# Patient Record
Sex: Female | Born: 1955 | Race: White | Hispanic: No | Marital: Single | Smoking: Former smoker
Health system: Southern US, Community
[De-identification: ages and names within clinical notes are randomized; demographics above are authoritative.]

## PROBLEM LIST (undated history)

## (undated) DIAGNOSIS — Z95810 Presence of automatic (implantable) cardiac defibrillator: Secondary | ICD-10-CM

## (undated) DIAGNOSIS — K219 Gastro-esophageal reflux disease without esophagitis: Secondary | ICD-10-CM

## (undated) DIAGNOSIS — K5790 Diverticulosis of intestine, part unspecified, without perforation or abscess without bleeding: Secondary | ICD-10-CM

## (undated) DIAGNOSIS — J449 Chronic obstructive pulmonary disease, unspecified: Secondary | ICD-10-CM

## (undated) DIAGNOSIS — E079 Disorder of thyroid, unspecified: Secondary | ICD-10-CM

## (undated) DIAGNOSIS — F4310 Post-traumatic stress disorder, unspecified: Secondary | ICD-10-CM

## (undated) DIAGNOSIS — M549 Dorsalgia, unspecified: Secondary | ICD-10-CM

## (undated) DIAGNOSIS — I429 Cardiomyopathy, unspecified: Secondary | ICD-10-CM

## (undated) DIAGNOSIS — K5792 Diverticulitis of intestine, part unspecified, without perforation or abscess without bleeding: Secondary | ICD-10-CM

## (undated) DIAGNOSIS — G8929 Other chronic pain: Secondary | ICD-10-CM

## (undated) DIAGNOSIS — I219 Acute myocardial infarction, unspecified: Secondary | ICD-10-CM

## (undated) DIAGNOSIS — I519 Heart disease, unspecified: Secondary | ICD-10-CM

## (undated) DIAGNOSIS — I509 Heart failure, unspecified: Secondary | ICD-10-CM

## (undated) DIAGNOSIS — J45909 Unspecified asthma, uncomplicated: Secondary | ICD-10-CM

## (undated) DIAGNOSIS — R112 Nausea with vomiting, unspecified: Secondary | ICD-10-CM

## (undated) DIAGNOSIS — I10 Essential (primary) hypertension: Secondary | ICD-10-CM

## (undated) DIAGNOSIS — K56609 Unspecified intestinal obstruction, unspecified as to partial versus complete obstruction: Secondary | ICD-10-CM

## (undated) DIAGNOSIS — I428 Other cardiomyopathies: Secondary | ICD-10-CM

## (undated) DIAGNOSIS — M1990 Unspecified osteoarthritis, unspecified site: Secondary | ICD-10-CM

## (undated) DIAGNOSIS — I493 Ventricular premature depolarization: Secondary | ICD-10-CM

## (undated) DIAGNOSIS — E785 Hyperlipidemia, unspecified: Secondary | ICD-10-CM

## (undated) HISTORY — PX: CARDIAC CATHETERIZATION: SHX172

## (undated) HISTORY — DX: Diverticulosis of intestine, part unspecified, without perforation or abscess without bleeding: K57.90

## (undated) HISTORY — DX: Dorsalgia, unspecified: M54.9

## (undated) HISTORY — DX: Disorder of thyroid, unspecified: E07.9

## (undated) HISTORY — DX: Unspecified intestinal obstruction, unspecified as to partial versus complete obstruction: K56.609

## (undated) HISTORY — DX: Diverticulitis of intestine, part unspecified, without perforation or abscess without bleeding: K57.92

## (undated) HISTORY — DX: Heart disease, unspecified: I51.9

## (undated) HISTORY — DX: Cardiomyopathy, unspecified: I42.9

## (undated) HISTORY — PX: NECK SURGERY: SHX720

## (undated) HISTORY — DX: Post-traumatic stress disorder, unspecified: F43.10

## (undated) HISTORY — DX: Chronic obstructive pulmonary disease, unspecified: J44.9

## (undated) HISTORY — PX: TONSILLECTOMY: SUR1361

## (undated) HISTORY — PX: CARPAL TUNNEL RELEASE: SHX101

## (undated) HISTORY — PX: CORONARY ANGIOPLASTY: SHX604

## (undated) HISTORY — DX: Other chronic pain: G89.29

## (undated) HISTORY — DX: Hyperlipidemia, unspecified: E78.5

## (undated) HISTORY — PX: ICD IMPLANT: EP1208

## (undated) HISTORY — PX: HAND SURGERY: SHX662

## (undated) HISTORY — DX: Nausea with vomiting, unspecified: R11.2

## (undated) HISTORY — DX: Ventricular premature depolarization: I49.3

## (undated) HISTORY — DX: Unspecified asthma, uncomplicated: J45.909

## (undated) HISTORY — PX: OTHER SURGICAL HISTORY: SHX169

## (undated) HISTORY — DX: Essential (primary) hypertension: I10

## (undated) HISTORY — PX: CHOLECYSTECTOMY: SHX55

## (undated) HISTORY — PX: APPENDECTOMY: SHX54

## (undated) HISTORY — PX: NASAL SEPTUM SURGERY: SHX37

## (undated) HISTORY — DX: Other cardiomyopathies: I42.8

---

## 1998-04-12 ENCOUNTER — Ambulatory Visit (HOSPITAL_COMMUNITY): Admission: RE | Admit: 1998-04-12 | Discharge: 1998-04-12 | Payer: Self-pay | Admitting: Family Medicine

## 1999-01-03 ENCOUNTER — Ambulatory Visit (HOSPITAL_COMMUNITY): Admission: RE | Admit: 1999-01-03 | Discharge: 1999-01-03 | Payer: Self-pay | Admitting: Neurosurgery

## 1999-01-03 ENCOUNTER — Encounter: Payer: Self-pay | Admitting: Neurosurgery

## 2000-04-02 ENCOUNTER — Encounter: Payer: Self-pay | Admitting: Neurosurgery

## 2000-04-02 ENCOUNTER — Ambulatory Visit (HOSPITAL_COMMUNITY): Admission: RE | Admit: 2000-04-02 | Discharge: 2000-04-02 | Payer: Self-pay | Admitting: Neurosurgery

## 2000-04-09 ENCOUNTER — Encounter: Payer: Self-pay | Admitting: Cardiovascular Disease

## 2001-09-25 ENCOUNTER — Ambulatory Visit (HOSPITAL_COMMUNITY): Admission: RE | Admit: 2001-09-25 | Discharge: 2001-09-25 | Payer: Self-pay

## 2007-10-05 ENCOUNTER — Ambulatory Visit (HOSPITAL_BASED_OUTPATIENT_CLINIC_OR_DEPARTMENT_OTHER): Admission: RE | Admit: 2007-10-05 | Discharge: 2007-10-05 | Payer: Self-pay | Admitting: Urology

## 2007-10-05 ENCOUNTER — Encounter (INDEPENDENT_AMBULATORY_CARE_PROVIDER_SITE_OTHER): Payer: Self-pay | Admitting: Urology

## 2008-05-20 ENCOUNTER — Ambulatory Visit (HOSPITAL_COMMUNITY): Admission: RE | Admit: 2008-05-20 | Discharge: 2008-05-20 | Payer: Self-pay | Admitting: Neurosurgery

## 2008-07-06 ENCOUNTER — Inpatient Hospital Stay (HOSPITAL_COMMUNITY): Admission: RE | Admit: 2008-07-06 | Discharge: 2008-07-07 | Payer: Self-pay | Admitting: Neurosurgery

## 2008-09-20 ENCOUNTER — Ambulatory Visit (HOSPITAL_BASED_OUTPATIENT_CLINIC_OR_DEPARTMENT_OTHER): Admission: RE | Admit: 2008-09-20 | Discharge: 2008-09-20 | Payer: Self-pay | Admitting: Orthopedic Surgery

## 2008-12-12 ENCOUNTER — Encounter: Admission: RE | Admit: 2008-12-12 | Discharge: 2008-12-12 | Payer: Self-pay | Admitting: Neurosurgery

## 2009-03-27 ENCOUNTER — Encounter (INDEPENDENT_AMBULATORY_CARE_PROVIDER_SITE_OTHER): Payer: Self-pay | Admitting: *Deleted

## 2009-03-27 ENCOUNTER — Ambulatory Visit: Payer: Self-pay | Admitting: Cardiovascular Disease

## 2009-03-29 ENCOUNTER — Encounter: Payer: Self-pay | Admitting: Cardiovascular Disease

## 2009-04-04 ENCOUNTER — Ambulatory Visit: Payer: Self-pay | Admitting: Cardiovascular Disease

## 2009-04-04 ENCOUNTER — Inpatient Hospital Stay (HOSPITAL_BASED_OUTPATIENT_CLINIC_OR_DEPARTMENT_OTHER): Admission: RE | Admit: 2009-04-04 | Discharge: 2009-04-04 | Payer: Self-pay | Admitting: Cardiovascular Disease

## 2009-04-07 ENCOUNTER — Ambulatory Visit: Payer: Self-pay | Admitting: Cardiovascular Disease

## 2009-04-28 ENCOUNTER — Ambulatory Visit: Payer: Self-pay | Admitting: Cardiovascular Disease

## 2009-05-11 ENCOUNTER — Ambulatory Visit: Payer: Self-pay | Admitting: Cardiovascular Disease

## 2009-05-25 ENCOUNTER — Ambulatory Visit: Payer: Self-pay | Admitting: Cardiovascular Disease

## 2009-05-31 ENCOUNTER — Ambulatory Visit: Payer: Self-pay

## 2009-06-02 ENCOUNTER — Encounter: Payer: Self-pay | Admitting: Internal Medicine

## 2009-06-05 ENCOUNTER — Ambulatory Visit: Payer: Self-pay | Admitting: Cardiovascular Disease

## 2009-06-27 ENCOUNTER — Ambulatory Visit: Payer: Self-pay | Admitting: Cardiovascular Disease

## 2009-06-28 ENCOUNTER — Telehealth: Payer: Self-pay | Admitting: Cardiovascular Disease

## 2009-07-27 ENCOUNTER — Ambulatory Visit: Payer: Self-pay | Admitting: Cardiovascular Disease

## 2009-09-06 ENCOUNTER — Ambulatory Visit (HOSPITAL_COMMUNITY)
Admission: RE | Admit: 2009-09-06 | Discharge: 2009-09-06 | Payer: Self-pay | Source: Home / Self Care | Admitting: Cardiovascular Disease

## 2009-09-06 ENCOUNTER — Encounter: Payer: Self-pay | Admitting: Cardiovascular Disease

## 2009-09-06 ENCOUNTER — Ambulatory Visit: Payer: Self-pay

## 2009-09-06 ENCOUNTER — Ambulatory Visit: Payer: Self-pay | Admitting: Cardiovascular Disease

## 2009-09-12 ENCOUNTER — Ambulatory Visit: Payer: Self-pay | Admitting: Cardiovascular Disease

## 2010-01-09 ENCOUNTER — Ambulatory Visit: Payer: Self-pay | Admitting: Cardiovascular Disease

## 2010-02-20 ENCOUNTER — Telehealth: Payer: Self-pay | Admitting: Cardiovascular Disease

## 2010-02-28 ENCOUNTER — Ambulatory Visit: Payer: Self-pay | Admitting: Cardiovascular Disease

## 2010-03-09 ENCOUNTER — Ambulatory Visit: Payer: Self-pay | Admitting: Cardiovascular Disease

## 2010-03-21 ENCOUNTER — Telehealth: Payer: Self-pay | Admitting: Cardiovascular Disease

## 2010-04-10 ENCOUNTER — Encounter: Admission: RE | Admit: 2010-04-10 | Discharge: 2010-04-10 | Payer: Self-pay | Admitting: Neurosurgery

## 2010-06-04 ENCOUNTER — Ambulatory Visit: Payer: Self-pay | Admitting: Cardiovascular Disease

## 2010-06-19 ENCOUNTER — Telehealth: Payer: Self-pay | Admitting: Cardiovascular Disease

## 2010-08-26 DIAGNOSIS — Z95810 Presence of automatic (implantable) cardiac defibrillator: Secondary | ICD-10-CM

## 2010-08-26 DIAGNOSIS — I219 Acute myocardial infarction, unspecified: Secondary | ICD-10-CM | POA: Insufficient documentation

## 2010-08-26 HISTORY — DX: Presence of automatic (implantable) cardiac defibrillator: Z95.810

## 2010-09-11 ENCOUNTER — Ambulatory Visit: Admit: 2010-09-11 | Payer: Self-pay | Admitting: Cardiovascular Disease

## 2010-09-25 NOTE — Miscellaneous (Signed)
  Clinical Lists Changes  Medications: Changed medication from CARVEDILOL 3.125 MG TABS (CARVEDILOL) Take one tablet by mouth twice a day to CARVEDILOL 6.25 MG TABS (CARVEDILOL) Take one tablet by mouth twice a day - Signed Added new medication of SIMVASTATIN 20 MG TABS (SIMVASTATIN) Take one tablet by mouth daily at bedtime - Signed Rx of CARVEDILOL 6.25 MG TABS (CARVEDILOL) Take one tablet by mouth twice a day;  #60 x 12;  Signed;  Entered by: Fredia Beets, RN;  Authorized by: Vivi Martens MD;  Method used: Electronically to [facility]. [phone]*, [address], [place], [place], [place]  [address], Ph: [phone] or [phone], Fax: [phone] Rx of SIMVASTATIN 20 MG TABS (SIMVASTATIN) Take one tablet by mouth daily at bedtime;  #30 x 12;  Signed;  Entered by: Fredia Beets, RN;  Authorized by: Vivi Martens MD;  Method used: Electronically to [facility]. [phone]*, [address] [place], [place], [place]  [address], Ph: [phone] or [phone], Fax: [phone]    Prescriptions: SIMVASTATIN 20 MG TABS (SIMVASTATIN) Take one tablet by mouth daily at bedtime  #30 x 12   Entered by:   Fredia Beets, RN   Authorized by:   Vivi Martens MD   Signed by:   Fredia Beets, RN on 04/07/2009   Method used:   Electronically to        [facility]. [phone]* (retail)       [address] [place], [place]  [address]       Ph: [phone] or [phone]       Fax: [phone]   RxID:   [id number] CARVEDILOL 6.25 MG TABS (CARVEDILOL) Take one tablet by mouth twice a day  #60 x 12   Entered by:   Fredia Beets, RN   Authorized by:   Vivi Martens MD   Signed by:   Fredia Beets, RN on 04/07/2009   Method used:   Electronically to        [facility]. [phone]* (retail)       [address]       [place],   [address]       Ph: [phone] or [phone]  Fax: [phone]   RxID:   [id number]

## 2010-09-25 NOTE — Progress Notes (Signed)
  Phone Note Outgoing Call   Call placed by: Rhett Bannister LPN Call placed to: Patient Summary of Call: T.C. to patient notifying her per Dr. Zenia Resides that labs were normal.

## 2010-09-25 NOTE — Letter (Signed)
Summary: [facility] Cardiology Records  [facility] Cardiology Records   Imported By: Sallee Provencal 04/14/2009 12:31:54  _____________________________________________________________________  External Attachment:    Type:   Image     Comment:   External Document

## 2010-09-25 NOTE — Progress Notes (Signed)
  Phone Note Call from Patient   Caller: Patient Summary of Call: T.C. from patient-c/o cold symptoms for about 1 week now.  Congested in nose and hard to breathe.  Wants to know what she can take with her other medications.  Phone # [phone] Initial call taken by: Rhett Bannister  LPN,  October 25, 624THL 11:54 AM  Follow-up for Phone Call        LMVM-avoid decongestants.  Can use antihistamine like Benadryl and cough syrup like Robitussin. Follow-up by: Rhett Bannister  LPN,  October 25, 624THL 1:23 PM

## 2010-09-25 NOTE — Progress Notes (Signed)
Summary: medication   Phone Note Call from Patient   Summary of Call: Pt states since d/c Spirolactone at her last visit she is extremely fatigued, edema in feet and BP is slightly higher per PCP.  She states some days she just doesn't feel like getting up.  Please call her with suggestions. Initial call taken by: Luana Shu,  February 20, 2010 11:17 AM  Follow-up for Phone Call        Phone Call Completed:  Patient put on Lasix 20mg  once daily per Dr. Zenia Resides.  Rx called in.    Follow-up by: Rhett Bannister,  February 20, 2010 3:15 PM

## 2010-09-25 NOTE — Procedures (Signed)
Summary: summary report  summary report   Imported By: Parks Neptune 06/06/2009 14:59:43  _____________________________________________________________________  External Attachment:    Type:   Image     Comment:   External Document

## 2010-09-25 NOTE — Miscellaneous (Signed)
Summary: new meds  Clinical Lists Changes  Medications: Added new medication of CARVEDILOL 3.125 MG TABS (CARVEDILOL) Take one tablet by mouth twice a day - Signed Added new medication of LISINOPRIL 5 MG TABS (LISINOPRIL) Take one tablet by mouth daily - Signed Rx of CARVEDILOL 3.125 MG TABS (CARVEDILOL) Take one tablet by mouth twice a day;  #60 x 12;  Signed;  Entered by: Fredia Beets, RN;  Authorized by: Vivi Martens MD;  Method used: Electronically to [facility]. [phone]*, [address] [place], [place], [place]  [address], Ph: [phone] or [phone], Fax: [phone] Rx of LISINOPRIL 5 MG TABS (LISINOPRIL) Take one tablet by mouth daily;  #30 x 12;  Signed;  Entered by: Fredia Beets, RN;  Authorized by: Vivi Martens MD;  Method used: Electronically to [facility]. [phone]*, [address], [place], [place], [place]  [address], Ph: [phone] or [phone], Fax: [phone]    Prescriptions: LISINOPRIL 5 MG TABS (LISINOPRIL) Take one tablet by mouth daily  #30 x 12   Entered by:   Fredia Beets, RN   Authorized by:   Vivi Martens MD   Signed by:   Fredia Beets, RN on 03/27/2009   Method used:   Electronically to        [facility]. [phone]* (retail)       [address] [place], [place]  [address]       Ph: [phone] or [phone]       Fax: [phone]   RxID:   [id number] CARVEDILOL 3.125 MG TABS (CARVEDILOL) Take one tablet by mouth twice a day  #60 x 12   Entered by:   Fredia Beets, RN   Authorized by:   Vivi Martens MD   Signed by:   Fredia Beets, RN on 03/27/2009   Method used:   Electronically to        [facility]. [phone]* (retail)       [address]       [place], [place]  [address]       Ph: [phone] or [phone]       Fax: [phone]   RxID:   [id number]

## 2010-09-25 NOTE — Cardiovascular Report (Signed)
Summary: Pre Cath Orders  Pre Cath Orders   Imported By: Sallee Provencal 04/14/2009 12:28:46  _____________________________________________________________________  External Attachment:    Type:   Image     Comment:   External Document

## 2010-09-25 NOTE — Progress Notes (Signed)
  Phone Note Outgoing Call   Call placed by: Rhett Bannister  LPN,  July 27, 624THL 624THL PM Call placed to: Patient Summary of Call: Patient notified per Dr. Fletcher Anon, Holter monitor showed lots of skipping beats(PVCs).  Advised to increase Carvedilol to 12.5mg  in AM and 6.25mg  in pm.

## 2010-10-12 ENCOUNTER — Ambulatory Visit: Payer: Medicare Other | Admitting: Cardiovascular Disease

## 2010-12-01 LAB — POCT I-STAT 3, VENOUS BLOOD GAS (G3P V)
Acid-base deficit: 1 mmol/L (ref 0.0–2.0)
Bicarbonate: 24.6 mEq/L — ABNORMAL HIGH (ref 20.0–24.0)
O2 Saturation: 72 %
TCO2: 26 mmol/L (ref 0–100)
pCO2, Ven: 44.3 mmHg — ABNORMAL LOW (ref 45.0–50.0)
pH, Ven: 7.353 — ABNORMAL HIGH (ref 7.250–7.300)
pO2, Ven: 40 mmHg (ref 30.0–45.0)

## 2010-12-01 LAB — POCT I-STAT 3, ART BLOOD GAS (G3+)
Bicarbonate: 24.4 mEq/L — ABNORMAL HIGH (ref 20.0–24.0)
O2 Saturation: 93 %
TCO2: 26 mmol/L (ref 0–100)
pCO2 arterial: 39.9 mmHg (ref 35.0–45.0)
pH, Arterial: 7.396 (ref 7.350–7.400)
pO2, Arterial: 69 mmHg — ABNORMAL LOW (ref 80.0–100.0)

## 2010-12-10 LAB — POCT HEMOGLOBIN-HEMACUE: Hemoglobin: 12.7 g/dL (ref 12.0–15.0)

## 2010-12-25 HISTORY — PX: OTHER SURGICAL HISTORY: SHX169

## 2011-01-08 NOTE — Assessment & Plan Note (Signed)
[place] OFFICE NOTE   NAME:[name], [name]                       MRN:          [id number]  DATE:06/04/2010                            DOB:          [birth year]    [name] is a 55 year old female who is here today for a followup  visit.  She has the following problem list:  1. History of nonischemic cardiomyopathy.  Ejection fraction was 25-      30% in July 2010.  Cardiac catheterization in August 2010 showed      nonobstructive coronary artery disease.  Right heart      catheterization showed normal right-sided pressures.  Most recent      ejection fraction was 50%.  2. Hyperlipidemia.  3. Chronic back pain.  4. Tobacco use.  5. History of frequent PVCs.   INTERIM HISTORY:  During her last visit, the patient complained of  generalized fatigue and dizziness.  I decreased her dose of Lasix to  once a day.  I also asked her to decrease lisinopril to 5 mg once daily.  However, she continued taking it twice daily.  I requested Holter  monitor, which showed still frequent PVCs.  Thus, the dose of carvedilol  was increased to 12.5 mg in the morning and 6.25 mg in the afternoon.  Overall, she feels slightly better than before.  She has chronic  exertional dyspnea.  Her dizziness has improved.  She has less  palpitations than before.   CURRENT MEDICATIONS:  1. Simvastatin 20 mg at bedtime.  2. Nexium 40 mg daily.  3. Duragesic patch 75 mcg every 3 days.  4. Aspirin 81 mg once daily.  5. Lasix 20 mg once daily.  6. Lisinopril 5 mg twice daily, this will be changed to once daily.  7. Carvedilol 12.5 mg in the morning and 6.25 mg in the afternoon.   PHYSICAL EXAMINATION:  VITAL SIGNS:  Weight is 127.8 pounds, blood  pressure is 103/66, pulse is 64, oxygen saturation is 97% on room air.  NECK:  No JVD or carotid bruits.  LUNGS:  Clear to auscultation.  HEART:  Regular rate and rhythm with no gallops or  murmurs.  ABDOMEN:  Benign, nontender, nondistended.  EXTREMITIES:  With no clubbing, cyanosis, or edema.   IMPRESSION:  1. 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  Most recent ejection      fraction improved to 50%.  She continues to feel dizzy and      lightheaded.  Thus, I will decrease lisinopril to 5 mg once daily.      We will continue with carvedilol and once daily furosemide.  She      did not tolerate spironolactone in the past due to hyperkalemia.  2. Frequent premature ventricular contractions with palpitations.      These improved after increasing the dose of carvedilol to 12.5 mg      in the morning.  We will consider obtaining a Holter monitor in the      future.  If there are recurrent symptoms, then I will consider      switching carvedilol to metoprolol.  3. Tobacco use.  The patient is trying to quit smoking and currently      is using a nicotine patch.  She will follow up in 4 months from now      or earlier if needed.     Kathlyn Sacramento, MD  Electronically Signed    MA/MedQ  DD: 06/04/2010  DT: 06/05/2010  Job #: [id number]

## 2011-01-08 NOTE — Assessment & Plan Note (Signed)
[facility] OFFICE NOTE   NAME:[name], Crystal [name]                       MRN:          [id number]  DATE:01/09/2010                            DOB:          1956-06-07    PROBLEM LIST:  1. History of nonischemic cardiomyopathy.  Her ejection fraction was      25-30% in July 2010.  In August 2010, she had nonobstructive      coronary artery disease and left heart catheterization and normal      pressure is on the right heart catheterization.  Repeat echo in      January 2011 showed a normalized ejection fraction of 50-55%.  2. Hyperlipidemia.  3. Chronic back pain.  4. Tobacco use.   INTERVAL HISTORY:  The patient states she has been doing about the same  since her last visit.  She is currently in the middle of the move and is  under much stress because of it.  She has been compliant with her  medications.  She continues to endorse fatigue and dyspnea with moderate-  to-more exertion.  She has chest tightness infrequently.  She has  decreased her tobacco use to approximately 4 cigarettes a day, but is  requesting medical intervention to assist with complete cessation.  She  has had no significant change in lower extremity edema.   PHYSICAL EXAMINATION:  VITAL SIGNS:  Blood pressure is 97/64, pulse 73,  satting 96% on room air, and she weighs 138 pounds.  GENERAL:  No acute distress.  HEENT:  Normocephalic, atraumatic.  NECK:  Supple.  HEART:  Regular rate and rhythm without murmur, rub or gallop.  LUNGS:  Clear bilaterally.  ABDOMEN:  Soft, nontender, nondistended.  EXTREMITIES:  Without edema.  SKIN:  Warm and dry.   Review of the patient's labs drawn on Jan 05, 2010; sodium 141,  potassium 5.2, chloride 102, CO2 32, BUN 14, creatinine 0.6, glucose 81,  total cholesterol 178, HDL 56, triglycerides 100, LDL 102.   ASSESSMENT AND PLAN:  The patient is doing reasonably well from a  cardiovascular  standpoint.  She is mildly hyperkalemic on her lab work.  Today, we will stop Aldactone 12.5 mg.  She should continue on her other  medications.  Ejection fraction per the official report was normal,  however per my independent read it was mildly reduced.  She should  continue on beta-  blocker and ACE inhibitor.  We will write her a prescription for Chantix  today which she will take  for a period of 12 weeks.  Her cholesterol is adequate and she should  continue on simvastatin.  We will see her back in 3 months' time.     Arlee Muslim, MD  Electronically Signed    SGA/MedQ  DD: 01/09/2010  DT: 01/09/2010  Job #: [id number]

## 2011-01-08 NOTE — Assessment & Plan Note (Signed)
[facility] OFFICE NOTE   NAME:[name], [name]                       MRN:          [id number]  DATE:05/11/2009                            DOB:          25-Jan-1956    PROBLEM LIST:  1. Nonischemic cardiomyopathy.  Echo dated on February 28, 2009, showed an      ejection fraction of 25-30%.  A left heart catheterization on      August 2010 shows minimal nonobstructive coronary artery disease.      LV gram showed an ejection fraction of 25-30%.  Right heart      catheterization at the same time showed a normal pulmonary artery      pressure and mean capillary wedge pressure of 8.  The patient has      no history of alcohol abuse.  She had a negative iron profile,      negative ANA, negative HIV test and has a questionable family      history of cardiac issues.  2. Hyperlipidemia.  3. Chronic back pain causing disability.   INTERVAL HISTORY:  Since her last visit, the patient states she has had  slightly increased levels of fatigue.  Her dyspnea on exertion has been  stable and that she gets moderate dyspnea after walking for 2-3 minutes.  She has not had any increase in lower extremity edema.  She continues to  have three-pillow orthopnea.  She denies any paroxysmal nocturnal  dyspnea.  She also denies any syncopal events.  She does endorse  occasional palpitations that are not associated with dizziness.  She  states that she has been compliant with all of her medications.   PHYSICAL EXAMINATION:  VITAL SIGNS:  Her blood pressure initially was  101/65, however rechecked manually was 122/64.  She weighs 138 pounds,  she has been stable over the past month.  Her pulse is 72, sating 96% on  room air.  GENERAL:  She is in no acute distress.  HEENT:  Nonfocal.  NECK:  There is no JVD.  HEART:  Regular rate and rhythm without murmur, rub or gallop.  LUNGS:  Clear bilaterally.  ABDOMEN:  Soft, nontender and  nondistended.  EXTREMITIES:  Without edema.  SKIN:  Warm and dry.   LABORATORY FINDINGS:  Review of the patient's most recent labs show a  sodium of 137, potassium of 4.6, chloride 101, CO2 30, BUN 18,  creatinine 0.66 and glucose is 88.   MEDICATION LIST:  1. Aspirin 81 mg daily.  2. Coreg 6.25 mg b.i.d.  3. Lisinopril 5 mg b.i.d.  4. Aldactone 12.5 mg daily.  5. Duragesic patch 75 every 2 days.  6. Simvastatin 20 mg p.o. nightly.  7. Nexium 40 mg daily.   ASSESSMENT:  A 55 year old white female with nonischemic cardiomyopathy  (NYHA 2-3) that is likely either familiar or idiopathic in origin.  She  continues to appear euvolemic or perhaps slightly positive fluid status.  We will continue the above medications, except we will increase her  Aldactone to 25 mg daily.  The patient does state she has been  having  increased fatigue, so we will hold off on increasing the beta-blocker at  this time.  However, her blood pressure is stable.  At the next office  visit, we will plan on increasing her beta-blocker dose.  At the  beginning of October, we will likely recheck a transthoracic  echocardiogram to evaluate for any improvement in left ventricular  systolic function.  We will also have her see Dr. Caryl Comes on October 11 to  be evaluated for ICD implantation for primary prevention of sudden  cardiac death.      Arlee Muslim, MD  Electronically Signed    SGA/MedQ  DD: 05/11/2009  DT: 05/12/2009  Job #: [id number]

## 2011-01-08 NOTE — Op Note (Signed)
NAME:  [name], [name]                ACCOUNT NO.:  [id number]   MEDICAL RECORD NO.:  [id number]          PATIENT TYPE:  AMB   LOCATION:  [facility]                         FACILITY:  [facility]   PHYSICIAN:  Lucina Mellow. Terance Hart, M.D.DATE OF BIRTH:  [birth year]   DATE OF PROCEDURE:  10/05/2007  DATE OF DISCHARGE:                               OPERATIVE REPORT   PREOPERATIVE DIAGNOSIS:  Bladder ulcer that is inflammatory or possibly  malignant in origin.   POSTOPERATIVE DIAGNOSIS:  Bladder ulcer that is inflammatory or possibly  malignant in origin.   PROCEDURE:  1. Cystoscopy.  2. Bilateral retrograde pyelograms with interpretation.  3. Cold cup bladder biopsy.   SURGEON:  Hessie Diener, MD   ANESTHESIA:  General.   INDICATIONS:  This lady has had a long history of recurrent UTIs and has  bilateral stones on a CT scan and she was cystoscoped and found to have  denuded mucosa with yellow-gray base and some small calcifications that  were persistent on followup therapy for infection.  She is brought to  the OR today for further evaluation including biopsy to determine the  etiology of these bladder lesions.   DESCRIPTION OF PROCEDURE:  The patient was brought to the operating room  and placed in lithotomy position.  External genitalia were prepped and  draped in the usual fashion.  She was cystoscoped and there were no  papillary tumors, but there was an area of a yellow fibrinous, denuded  mucosa on the left bladder floor and wall, partially including the  mucosa over the intramural ureter.   Bilateral retrograde pyelograms were obtained by utilizing a cone-tip  catheter inserted cystoscopically.  Both ureters were delicate and  nonobstructed.  The pyelocaliceal systems were also delicate and  nonobstructed and with no filling defects.   Numerous cold cup bladder biopsies were taken from peripheral and  central areas of this abnormal bladder mucosa on the left floor and wall  and  specimens were sent to Pathology.  At the conclusion of the  procedure, I reinjected contrast in the left ureter and saw no evidence  of extravasation or obstruction or perforation.   At this point, the bladder was emptied, cystoscope removed and B&O  suppository inserted.  She was taken to the recovery room in good  condition.      Lucina Mellow. Terance Hart, M.D.  Electronically Signed     LJP/MEDQ  D:  10/05/2007  T:  10/06/2007  Job:  [id number]

## 2011-01-08 NOTE — Op Note (Signed)
NAME:  [name], ROIGER NO.:  [id number]   MEDICAL RECORD NO.:  [id number]          PATIENT TYPE:  INP   LOCATION:  [id number]                         FACILITY:  [facility]   PHYSICIAN:  Ophelia Charter, M.D.DATE OF BIRTH:  [birth year]   DATE OF PROCEDURE:  07/06/2008  DATE OF DISCHARGE:                               OPERATIVE REPORT   BRIEF HISTORY:  The patient is a 55 year old white female who has  suffered from neck and arm pain.  She failed medical management and  worked up with a cervical MRI and myelo-CT, which demonstrated the  patient had a spondylolisthesis at C4-5 as well as degenerative disk  disease and spondylosis at C5-6 and diffuse degenerative changes.  I  discussed the various treatment options with the patient including  surgery.  She has weighed the risks, benefits, and alternatives of  surgery and decided to proceed with a C4-5 and 5-6 anterior cervical  diskectomy, fusion and plating.   PREOPERATIVE DIAGNOSES:  C4-5 spondylolisthesis, C5-6 degenerative disk  disease, spondylosis, stenosis, cervical myelopathy, transverse  radiculopathy, cervicalgia.   POSTOPERATIVE DIAGNOSES:  C4-5 spondylolisthesis ,C5-6 degenerative disk  disease, spondylosis, stenosis, cervical myelopathy, transverse  radiculopathy, cervicalgia.   PROCEDURES:  C4-5 and C5-6 extensive anterior cervical  diskectomy/decompression; C4-5 and C5-6 anterior interbody arthrodesis  with local morselized autograft bone and Actifuse bone graft extender;  insertion of C4-5 and C5-6 interbody prosthesis (Alphatec PEEK interbody  prosthesis); C5 through C7 anterior cervical instrumentation with Codman  Slim-Loc titanium plate and screws.   SURGEON:  Ophelia Charter, MD   ASSISTANT:  Leeroy Cha, MD   ANESTHESIA:  General endotracheal.   ESTIMATED BLOOD LOSS:  100 mL.   SPECIMENS:  None.   DRAINS:  None.   COMPLICATIONS:  None.   DESCRIPTION OF PROCEDURE:  The patient was  brought to the operating room  by the anesthesia team.  General endotracheal anesthesia was induced.  The patient remained in supine position and a roll was placed under her  shoulders to place her neck in slight extension.  Her anterior cervical  region was then prepared with Betadine scrub and Betadine solution and  sterile drapes were applied.  I then used a scalpel to make a transverse  incision in the patient's left anterior neck.  I used the Metzenbaum  scissors to divide the platysma muscle and then to dissect medial to  sternocleidomastoid muscle, jugular vein, and carotid artery.  I  carefully dissected down towards the anterior cervical spine identifying  the esophagus and retracted medially.  I used skin swabs to clear soft  tissue from the anterior cervical spine and inserted a bent spinal  needles in the vertebral space.  We obtained intraoperative radiograph  to confirm our location.   We then used electrocautery to detach the medial border of muscle  bilaterally from the C4-5 and C5-6 intervertebral disk space.  We  inserted self-retaining retractor underneath the longus colli  bilaterally to provide exposure.  We began the decompression C4-5.  We  incised the C4-5 intervertebral disk with a #15 blade scalpel and we  performed a partial intervertebral diskectomy with a pituitary forceps  and a Karlin curettes.  We then inserted distraction screws at C4 and C5  and then distracted interspace.  We then used a high-speed drill to  decorticate the vertebral endplates at D34-534, drill away the remainder of  the C4-5 intervertebral disk.  We drill away some posterior spondylosis.  I then thinned out the posterior longitudinal ligaments.  We then  incised the ligament with arachnoid knife.  I then removed with Kerrison  punch undercutting the vertebral endplates at D34-534 decompressing the  thecal sac.  We then performed foraminotomies about the bilateral C5  nerve root completing  the decompression at this level.   We then repeated this procedure in an analogous fashion at C5-C6  decompressing the C5-C6 thecal sac and the bilateral C6 nerve roots.  This completed the decompression.   We now turned attention to arthrodesis.  We used trial spacers and  determined to use a 5-mm Alphatec medium PEEK interbody prosthesis at  both levels.  We prefilled this prosthesis with a combination of local  autograft bone.  We obtained decompression as well as Actifuse bone  graft extender.  We inserted the prosthesis into the distracted  interspace at C4-5, C5-6, and then removed distraction screw.  There was  a good snug fit of prosthesis at both levels.   We now turned our attention to anterior spinal instrumentation.  We used  a high-speed drill to remove some ventral spondylosis from the vertebral  endplates at D34-534 and 075-GRM, so the plate would lay down flat.  We  selected the appropriate length Codman Slim-Loc anterior cervical plate.  We laid along the anterior aspect of the vertebral bodies of C4 through  C6.  We then drilled two 12 mm holes at C4, 5, and 6 and secured the  plate to the vertebral bodies by placing two 12-mm self-tapping screws  at C4, 5, and 6.  We obtained intraoperative radiograph, which  demonstrated good position of plate, screws, and interbody prosthesis.  We, therefore, secured the screws to the plate by locking each cam.  This completed the instrumentation.   We then obtained hemostasis using bipolar electrocautery.  We irrigated  the wound out with bacitracin solution.  We removed the retractor.  We  then inspected the esophagus for any damage and there was none apparent.  We then reapproximated the patient's platysma muscle with interrupted 3-  0 Vicryl suture, subcutaneous tissue with interrupted 3-0 Vicryl suture,  and the skin with Steri-Strips and Benzoin.  The wound was then coated  with bacitracin ointment and a sterile dressing was  applied.  Drapes  were removed.  The patient was subsequently extubated by the anesthesia  team and transported to the postanesthesia care unit in stable  condition.  All sponge, instrument, and needle counts were correct at  the end of the case.      Ophelia Charter, M.D.  Electronically Signed     JDJ/MEDQ  D:  07/06/2008  T:  07/07/2008  Job:  [id number]

## 2011-01-08 NOTE — Assessment & Plan Note (Signed)
[facility] OFFICE NOTE   NAME:[name], [name]                       MRN:          [id number]  DATE:09/12/2009                            DOB:          Aug 19, 1956    PROBLEM LIST:  1. History of nonischemic cardiomyopathy echo July 6 showed an EF 25-      30%, left heart cath in August 10 showed minimal nonobstructive CAD      with an EF 25-30%.  RHC at that time showed normal PA pressures and      a wedge of 8.  2. Hyperlipidemia.  3. Chronic back pain.  4. Tobacco use.   INTERVAL HISTORY:  The patient states that since her last visit, she  thinks that she has had some improvement in her dyspnea on exertion.  She has been attempting to walk more and is able to walk through [facility] for about 20 minutes without having to stop and rest.  She  continues to get palpitations but no associated dizziness or syncope.  She is compliant with all her medications.  She is attempting to stop  smoking in that she is on the Nicorette patch and is currently down to  smoking one to two cigarettes a day.  She is in her second week of  titration of the patch.   PHYSICAL EXAMINATION:  VITAL SIGNS:  Her blood pressure is 112/63, her  pulse is 74, satting 97% on room air.  She weighs 136 pounds, which is 3  pounds less than she weighed at the beginning of December.  GENERAL:  She is in no acute distress.  HEENT:  Normocephalic, atraumatic.  NECK:  Supple.  There is no JVD.  HEART:  Regular rate and rhythm without murmur, rub, or gallop.  LUNGS:  Clear bilaterally.  ABDOMEN:  Soft, nontender, nondistended.  EXTREMITIES:  Without edema.  SKIN:  Warm and dry.   Review of the echo report dated January 12, left ventricular systolic  function was thought to be 50%-55%; however, it also stated that there  was a diffuse hypokinesis.  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  There is  mild mitral regurgitation.   ASSESSMENT/PLAN:   The patient's left ventricular systolic function  appears to have improved as has her symptomatology.  She will continue  on the current medical regimen as listed in the chart.  She may stop  taking the digoxin.  If she notices that she feels worse off the  digoxin, she should restart it.  At the next office visit, we will check  a BMP, BNP, and fasting lipids.  She  states she is also willing to try Chantix if the Nicorette patches does  not work regarding her  tobacco cessation.  We will see the patient back in the 4 months' time  or if the problem were to arise in the interim.     Arlee Muslim, MD  Electronically Signed    SGA/MedQ  DD: 09/12/2009  DT: 09/13/2009  Job #: [id number]

## 2011-01-08 NOTE — Assessment & Plan Note (Signed)
[facility] OFFICE NOTE   NAME:[name], [name]                       MRN:          [id number]  DATE:07/27/2009                            DOB:          22-Oct-1955    PROBLEM LIST:  1. Nonischemic cardiomyopathy, echo dated February 28, 2009, EF 25-30%,      left heart cath in August 2010 showed minimal nonobstructive CAD      with an EF 25-30%, right heart cath at that time showed normal PA      pressures and a wedge of 8.  Echocardiogram dated October 2010      showed an EF of 40-45%.  The official read and my personal review      demonstrate an ejection fraction closer to 35%.  2. Hyperlipidemia.  3. Chronic back pain.  4. Tobacco use.   INTERVAL HISTORY:  The patient states since her last visit, she has been  getting along about the same.  She still has fatigue and weakness with  mild to moderate exertion.  She continues to have frequent palpitations  lasting several minutes.  These were present when she wore her Holter  monitor which indicated frequent PVCs.  The patient denies any lower  extremity edema.  She has been compliant with her medications.  At last  office visit, we had instructed her to hold the digoxin to see if her  intermittent visual abnormalities improved and they had not, so she  restarted that medication.  She continues to have infrequent but  intermittent blurriness/waviness of her vision.   PHYSICAL EXAMINATION:  VITAL SIGNS:  Today, blood pressure is 103/63,  pulse is 71, satting 96% on room air.  She weighs 139 pounds, which is 2  pounds less than she weighed at the beginning of November 2010.  GENERAL:  No acute distress.  HEENT:  Nonfocal.  NECK:  Supple.  There is no JVD.  HEART:  Regular rate and rhythm without any murmur, rub, or gallop.  LUNGS:  Clear bilaterally.  ABDOMEN:  Soft, nontender, nondistended.  EXTREMITIES:  Without edema.  SKIN:  Cool and dry.   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  She is on a good medical regimen which include  Coreg 12.5 mg b.i.d., lisinopril 5 mg b.i.d. Aldactone 12.5 mg daily,  and digoxin 0.125 mg daily.  She also takes simvastatin 20 mg p.o. daily  at bedtime and aspirin 81 mg daily.  The patient is having persistent  weakness and fatigue and her blood pressures has been on a low side.  Today, we will try decreasing her Coreg 6.25 mg b.i.d. to see if this  improves the weakness and fatigue she is experiencing.  We will check an  echo at the beginning of January to more firmly establish an accurate  ejection  fraction.  At that time, if her ejection fraction remains below 35%, we  will arrange for defibrillator  placement.  The patient is instructed that if her palpitations persist  longer than usual that she should report  to the emergency room or call  911 if necessary.     Arlee Muslim, MD  Electronically Signed    SGA/MedQ  DD: 07/27/2009  DT: 07/28/2009  Job #: [id number]

## 2011-01-08 NOTE — Letter (Signed)
March 29, 2009    [facility]  [place], [place]   RE:  ISLAROSE, LOCKHEART  MRN:  [id number]  /  DOB:  06/19/1956   Dear Ms. Carlson,   I have received the records of the previous cardiac workup that you  received two years ago and would like to discuss these results with you.  We have, unfortunately, been unable to reach you by telephone.  Please  contact my office at [phone] at your earliest convenience so that  we can discuss the next steps that must be taken.   I look forward to hearing from you.    Sincerely,      Arlee Muslim, MD    SGA/MedQ  DD: 03/29/2009  DT: 03/29/2009  Job #: [id number]

## 2011-01-08 NOTE — Assessment & Plan Note (Signed)
[facility] OFFICE NOTE   NAME:[name] [name] HODGMAN                       MRN:          [id number]  DATE:06/27/2009                            DOB:          Nov 09, 1955    PROBLEM LIST:  1. Nonischemic cardiomyopathy.  Echo dated February 28, 2009 showed an EF      of 25-30%.  Left heart cath in August 2010 showed minimal      nonobstructive CAD with an EF of 25-30%, right heart cath at that      time showed normal PA pressures and a mean wedge pressure of 8.      Echocardiogram from October 2010 showed an ejection fraction of 40-      45% per the official read, however, no comparison to the old study      was made.  My personal review of the echo demonstrated an ejection      fraction of approximately 35-40%.  2. Hyperlipidemia.  3. Chronic back pain causing disability.   INTERVAL HISTORY:  The patient states since her last visit with Dr.  Caryl Comes on June 05, 2009, she has been getting along fairly well.  She  does endorse some new episodes of wavy lines in her visual field that  are sometimes associated with some dizziness.  These have started since  her last office visit.  Of note, the patient was started on digoxin  during her last office visit.  She continues to sleep on several  pillows.  She denies any syncopal episodes, although she did fall  yesterday after she had one of the episodes of visual disturbances.  She  is compliant with her medications, but has not taken them yet this  morning.   PHYSICAL EXAMINATION:  VITAL SIGNS:  Today, her blood pressure is  114/63, pulse is 64, and weight is 141 pounds.  She is sating 97% on  room air.  GENERAL:  No acute distress.  HEENT:  Nonfocal.  NECK:  There is no JVD.  HEART:  Regular rate and rhythm with a 1/6 systolic murmur.  LUNGS:  Clear bilaterally.  ABDOMEN:  Soft, nontender, and nondistended.  EXTREMITIES:  Without edema.  SKIN:  Cool and dry.   Review  of the patient's Holter monitor demonstrates multiple PVCs, they  comprise less than 10% of her total heartbeats.  There are 2  morphologies to the PVCs.  Review of her echocardiogram as above in HPI.   ASSESSMENT AND PLAN:  The patient has had no changes in her symptoms  over the past month since digoxin was started.  The visual disturbances  may be secondary to this medication, therefore, we will ask her to hold  the medication for 1 week's time.  If she experiences any dizziness or  the visual disturbances continue off the medication, she should contact  our office and we may consider decreasing her beta-blocker dose.  She  should continue on her other medications which include lisinopril 5 mg  b.i.d., Aldactone 12.5 mg daily, carvedilol 12.5 mg b.i.d., and  simvastatin 20 mg at bedtime.  She is also on Nexium, Duragesic patch,  Advair p.r.n., and Combivent inhaler p.r.n.  We will get the images of  the first echocardiogram that the patient had and compare it to the most  recent.  It appears that her ejection fraction is at least  moderately and possibly still severely depressed.  Because the last  echocardiogram showed a borderline ejection fraction, we may follow the  patient for now and repeat an echocardiogram again in the 3 months'  time.  We will see the patient back in 1 month's time.     Arlee Muslim, MD  Electronically Signed    SGA/MedQ  DD: 06/27/2009  DT: 06/28/2009  Job #: [id number]

## 2011-01-08 NOTE — Op Note (Signed)
[name], [name] NO.:  [id number]   MEDICAL RECORD NO.:  [id number]          PATIENT TYPE:  AMB   LOCATION:  [place]                          FACILITY:  [facility]   PHYSICIAN:  Daryll Brod, M.D.       DATE OF BIRTH:  [birth year]   DATE OF PROCEDURE:  09/20/2008  DATE OF DISCHARGE:                               OPERATIVE REPORT   PREOPERATIVE DIAGNOSIS:  Cubital tunnel syndrome, left elbow.   POSTOPERATIVE DIAGNOSIS:  Cubital tunnel syndrome, left elbow.   OPERATION:  Decompression of left ulnar nerve.   SURGEON:  Daryll Brod, MD   ASSISTANT:  Odessa Fleming, RN   ANESTHESIA:  Supraclavicular block.   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  This has not responded to conservative  treatment.  She has elected to undergo surgical decompression.  Pre,  peri, and postoperative course is discussed along with the risks and  complications.  She is aware that there is no guarantee with the  surgery; possibility of infection; recurrence of injury to arteries,  nerves, and tendons; incomplete relief of symptoms; dystrophy; and  possibility of numbness and tingling at posterior aspect of her elbow  secondary to scarring, injury to the posterior branch and the medial  antebrachial cutaneous nerve of the forearm.  In the preoperative area,  the patient is seen.  The extremity marked by both the patient and  surgeon.  Antibiotic given.   PROCEDURE:  The patient was brought to the operating room where a  supraclavicular block was carried out under the direction of Dr.  Ola Spurr.  She was prepped using DuraPrep, supine position, left arm  free.  A time-out was taken.  The limb was exsanguinated with an Esmarch  bandage, tourniquet was placed in the upper arm, was inflated to 250  mmHg.  A longitudinal incision was made over the medial epicondyle and  carried down through  the subcutaneous tissue.  Care was taken to protect  the branches to the nerve.  Bleeders were electrocauterized with  bipolar.  Osborne fascia was opened.  The ulnar nerve was identified.  Blunt and sharp dissection allowed dissection proximally to the arcade  of Struthers.  The [place] retractors were placed allowing visualization  of the fascia overlying the ulnar nerve.  This was released proximally  for approximately 6 cm.  The subcutaneous tissue was then dissected off  from the forearm fascia of the flexor carpi ulnaris, two heads.  A  release to the fascia was then performed.  The muscle was then split.  The investing fascia over the nerve was then also split taking care to  protect branches to the flexor carpi ulnaris.  This was done with [facility]  retractors and placement for approximately 6 cm distally.  The elbow was  placed through full flexion.  No subluxation to the nerve was apparent.  The wound was copiously irrigated with saline.  Skin was then closed  with interrupted 5-0 Vicryl Rapide sutures  after local infiltration with  0.25% Marcaine without epinephrine.  A sterile compressive dressing,  long-arm splint with the  elbow in approximately 25 degrees of flexion was placed.  The patient  tolerated the procedure well and was taken to the recovery room for  observation in satisfactory condition.  She will be discharged home to  return to the [facility] in 1 week, on Vicodin.           ______________________________  Daryll Brod, M.D.     GK/MEDQ  D:  09/20/2008  T:  09/20/2008  Job:  [id number]

## 2011-01-08 NOTE — Assessment & Plan Note (Signed)
[facility] OFFICE NOTE   NAME:[name], [name]                       MRN:          [id number]  DATE:04/28/2009                            DOB:          11/16/1955    PROBLEM LIST:  1. Nonischemic cardiomyopathy.  Left heart catheterization in August      2010, shows minimal nonobstructive coronary artery disease.  LV      gram showed an ejection fraction of 25-30%.  Right heart      catheterization at the same time showed a normal pulmonary artery      pressure  and pulmonary capillary wedge pressure of 8.  The patient      has no history of alcohol abuse.  She has had a negative iron      profile, negative ANA, negative HIV test, and has a questionable      family history of cardiac issues.  2. Hyperlipidemia.  3. Chronic back pain causing disability.  She is currently on      Duragesic patch.   INTERVAL HISTORY:  Since the last visit the patient states she has been  getting along about the same.  She endorses some mild shortness of  breath walking to her mail box, however, most of her shortness of breath  comes from activity she performs that are beyond her normal daily  activities such as playing with children.  She has infrequent chest  tightness.  She states that over the past couple of days, she had some  very mild bilateral lower extremity edema; however, it resolved on its  own.  She continues to sleep on 3 pillows and has done so for the past  year and states that when she lies down flat, she is not specifically  short of breath, but just not comfortable.  She denies any paroxysmal  nocturnal dyspnea.  She occasionally feels palpitations, but denies any  dizziness or syncopal episodes.  She states that she has been complaint  with her medications.   PHYSICAL EXAMINATION:  VITAL SIGNS:  Blood pressure is 112/71, pulse is  71, and she weighs 142 pounds, which is 4 pounds more than she weighed  in  August of this year.  She is sating 98% on room air.  GENERAL:  She is in no acute distress.  HEENT:  Nonfocal.  NECK:  Supple.  There is some mild JVD at approximately 20 degrees.  HEART:  Regular rate and rhythm without murmur or gallop.  LUNGS:  Clear to auscultation bilaterally.  ABDOMEN:  Soft, nontender, and nondistended.  EXTREMITIES:  Without edema.  SKIN:  Warm and dry.   MEDICATION LIST:  1. Aspirin 325 daily.  2. Coreg 6.25 b.i.d.  3. Lisinopril 5 mg daily.  4. Zocor 20 mg every evening.  5. Duragesic patch 75 mg every 2 days.   ASSESSMENT:  She is a 55 year old white female with nonischemic  cardiomyopathy (NYHA II) that is either familial or idiopathic in  origin.  She appears only very mildly volume overloaded on exam today  and her right heart catheterization last month  indicated that her wedge  pressure was normal.  She has however gained afew pounds that may be  secondary to fluid retention.  Today in Clinic, we will increase  lisinopril 5 mg daily to 5 mg b.i.d.  We will also place the patient on  Aldactone at a dose of 12.5 mg daily with the hope to titrate that up to  25 mg at her next office visit, if her potassium level were not to  increase to an unacceptable level.  Time was spent, discussing the  implantation of an AICD and the patient is receptive to this.  At her  next office visit, we will check a BNP, likely increase her Aldactone to  25 mg daily.  We will also make attempts further titrate up her beta-  blocker.  Should her edema reoccur or show any progressive signs of  volume overload, she would require Lasix therapy.  Within the next few  weeks, we will recheck an echocardiogram to reevaluate her left  ventricular systolic function.  If it were to remain severely depressed,  we will have EP see the patient for ICD implantation.     Arlee Muslim, MD  Electronically Signed    SGA/MedQ  DD: 04/28/2009  DT: 04/28/2009  Job #: [id number]

## 2011-01-08 NOTE — Assessment & Plan Note (Signed)
[facility] OFFICE NOTE   NAME:[name], [name]                       MRN:          [id number]  DATE:03/27/2009                            DOB:          1955/11/30    CHIEF COMPLAINT:  Cardiomyopathy.   HISTORY OF PRESENT ILLNESS:  [name] is a 55 year old white female,  past medical history significant for chronic neck pain, recent diagnosis  of asthma and hyperlipidemia who is presenting with an ejection fraction  of 25% - 30% and moderate mitral regurgitation on an echocardiogram  dated February 28, 2009.  [name] states that she had been seen by Dr.  Geraldo Pitter several years ago, at which time she had a left heart  catheterization, stress test and echocardiogram.  [name] is unsure as  to the reason why these were ordered, she is also unsure as to the  results of these tests.  She does state that she did not receive any  invasive intervention as a result of the left heart catheterization.  She states that over the past two years she has had some chest  discomfort only with extreme exertion and with anxiety.  The chest  discomfort does radiate to her left neck and is associated with some  nausea.  She states that the discomfort has not changed in frequency or  intensity over the past two years.  The [name] also states that over  the past year she has had increased dyspnea on exertion that she notices  only when she tries to walk up several flights of stairs.  She is able  to walk around [employer] without any significant difficulty.  She denies  any lower extremity edema.  She does state that she gets short of breath  when she lays down flat and continually sleeps on three pillows and has  done so for the past year or two.  She denies any paroxysmal nocturnal  dyspnea.  Most recently her primary care physician, Dr. Henrene Pastor, ordered a  chest x-ray which he thought to be abnormal.  This led to him ordering  an  echocardiogram which demonstrated an ejection fraction of 25% - [id number]  and end diastolic diameter of 7.2 cm and moderate mitral regurgitation.  This was not compared to any previous study.   PAST MEDICAL HISTORY:  1. [name] has had a cervical spine fusion in November of 2009.  2. She has had a carpal tunnel release done in the past.  3. She has had her tonsils, appendix and gallbladder removed.  4. Two C-sections in the past.  5. She is disabled from her back pain.   SOCIAL HISTORY:  The [name] continues to smoke approximately one pack a  day of cigarettes and has done so since she was a teenager.  She does  not drink any significant amounts of alcohol.  She is disabled.   FAMILY HISTORY:  Positive for premature coronary disease in that she had  a father who had a heart attack in his late 79s and a brother who  developed heart failure in his 42s.   ALLERGIES:  NO  KNOWN DRUG ALLERGIES.   MEDICATIONS:  1. Prempro 2.5 mg daily.  2. Nexium 40 mg daily.  3. Morphine 60 mg q.12 hours.  4. Advair p.r.n. and Combivent p.r.n. as she was recently diagnosed      with asthma.   REVIEW OF SYSTEMS:  As in HPI, all other systems were reviewed and are  negative.   PHYSICAL EXAMINATION:  [name] has a blood pressure of 129/82, her pulse  is 82, she is saturating 96% on room air and she weighs 140 pounds.  GENERALLY:  She is in no acute distress.  HEENT:  Extraocular movements are intact.  NECK:  Supple, there is no lymphadenopathy, there are no carotid bruits,  very mild JVD at approximately 20 degrees incline.  HEART:  Regular rate and rhythm with an occasional ectopic beat, there  is no murmur, rub or gallop.  Her heart sounds are relatively distant.  LUNGS:  Clear to auscultation bilaterally.  ABDOMEN:  Soft, nontender, nondistended.  EXTREMITIES:  Without edema.  SKIN:  Warm and dry.  NEURO:  Nonfocal.  PSYCHIATRIC:  [name] is appropriate with normal levels of insight,  although she  is mildly tearful and mildly anxious during certain parts  of the medical interview.   EKG independently interviewed by myself demonstrates sinus rhythm, left  ventricular hypertrophy,  leftward axis and incomplete right bundle  branch block and nonspecific T-wave abnormalities.   ASSESSMENT AND PLAN:  This is a 55 year old female with no known past  coronary history presenting with a cardiomyopathy of unknown etiology  and duration.  [name] also has associated moderate mitral  regurgitation.  We will first obtain the records from Dr. Geraldo Pitter from  two years ago when it appears a complete cardiac workup, including an  echocardiogram and left heart catheterization were performed.  Based on  the results of those exams we will tailor our approach.  The [name]  will likely need a repeat left heart catheterization, however, we will  defer these decisions until the [name]'s medical records can be  obtained.  We will contact the [name] once these medical records are  obtained and a decision in evaluation and management have been made.  For now we will start the [name] on aspirin 325 mg daily, Coreg 3.125  mg b.i.d. and lisinopril 5 mg daily as she does have significantly  reduced left ventricular systolic function.     Arlee Muslim, MD  Electronically Signed    SGA/MedQ  DD: 03/27/2009  DT: 03/27/2009  Job #: [id number]

## 2011-01-08 NOTE — Assessment & Plan Note (Signed)
[facility] OFFICE NOTE   NAME:[name], [name]                       MRN:          [id number]  DATE:05/25/2009                            DOB:          28-Dec-1955    PROBLEM LIST:  1. Nonischemic cardiomyopathy echo dated February 28, 2009 showed an      ejection fraction of 25-30%, heart catheterization in August 2010      showed minimal nonobstructive coronary disease an ejection fraction      of 25-30%.  Right heart catheterization at that time showed normal      pulmonary artery pressure and a mean capillary wedge pressure of 8.      The patient denies any history of alcohol abuse.  She has had a      negative iron profile, negative ANA, negative human      immunodeficiency virus test, and as a questionable family history      of cardiac issues.  2. Hyperlipidemia.  3. She has a chronic back pain causing disability.   INTERVAL HISTORY:  The patient states since her last visit, she has been  doing about the same.  She does endorse an occasional sensation in her  chest, they could last an hour or more at a time.  It is associated with  some increased weakness.  She denies any palpitations or syncope.  She  states compliance with her medications.  She denies any PND, but she  continues to sleep on 3 pillows.   PHYSICAL EXAMINATION:  VITAL SIGNS:  Blood pressure in her right arm is  94/70, left arm 96/70, heart rate is 74.  She weighs 138.6 pounds which  is approximately what she weighed at her last office visit.  GENERAL:  No acute distress.  HEENT:  Nonfocal.  NECK:  Supple.  There is no JVD.  HEART:  Regular rate and rhythm without murmur, rub, or gallop.  LUNGS:  Clear bilaterally.  ABDOMEN:  Soft, nontender, nondistended.  EXTREMITIES:  Without edema.  SKIN:  Warm and dry.   LABORATORY FINDINGS:  BMP showed a sodium of 134, potassium 5.5,  chloride 96, CO2 32, BUN 12, creatinine 0.66, glucose 90.   ASSESSMENT AND PLAN:  A 55 year old white female with nonischemic  cardiomyopathy (NYHA II - III) that is likely either familial or  radiographic in origin.  The patient appears euvolemic on exam today.  She is complaining of sensations in her chest that she is not able to  describe very well.  For this reason, we will place her on a 48-hour  Holter monitor in order to assess her for any arrhythmia specifically  nonsustained V-tach.  We will increase her Coreg to 12.5 mg b.i.d.  Because her blood pressures are borderline, she is instructed to contact  our office if she feels any increased weakness or dizziness after this  increasing dose of medication.  The patient is also hyperkalemic at 5.5.  For this reason, we will decrease her Aldactone to 12.5 mg daily as her  potassium was within normal limits on this lower dose.  We  have ordered  an  echo for next week which will reevaluate her left ventricular ejection  fraction 3 months after her initially discovered low EF.  She will see  Dr. Caryl Comes on October 11 for evaluation of ICD placement for primary  prevention of SCD.     Arlee Muslim, MD  Electronically Signed    SGA/MedQ  DD: 05/25/2009  DT: 05/26/2009  Job #: [id number]

## 2011-01-08 NOTE — Letter (Signed)
June 05, 2009    [name], [place], [place] [address]   RE:  Crystal, [name]  MRN:  [id number]  /  DOB:  [birth year]   Dear Nicole Kindred:   It was a pleasure seeing [name] today at your request for  consideration of ICD implantation.   As you know, she is a 55 year old woman with a history of nonischemic  cardiomyopathy that dates back about 5 years.  She has had significant  amounts of ventricular ectopy with this over the years.  When she came  to see you in July, she was noted have severe depression of LV systolic  function in the 20% range.  You have been aggressively titrating her  medications and this has been associated with a gradual improvement in  her exercise performance.  She still has moderate fatigue.  She does not  have nocturnal dyspnea, orthopnea, or peripheral edema.  She does sleep  on a number of pills, but she says this is because of her back and her  neck issues.   She also has complaints of chest pain.  This is described as midsternal  without radiation; it is unrelated to exertion, often last an hour, and  in the wake of somebody having tried her on Nexium, it had been much  improved.   The patient denies syncope.  She does have palpitations.  These she  describes as abrupt in onset and offset frequently accompanying the  chest discomfort.  She was aware of them while she had a Holter monitor  on (please see below).   PAST MEDICAL HISTORY:  In addition to the above, it is notable for  1. Chronic back pain with prior neck fusion surgery.  2. GE reflux disease.  3. Fatigue.  4. Asthma.   PAST SURGICAL HISTORY:  Notable for neck fusions, appendectomy, C-  sections, cholecystectomy, carpal tunnel, and a release of the arm,  unfortunately I did not clarify this while she was here.   SOCIAL HISTORY:  She is living with her boyfriend at this time.  She has  3 children.  She continues to smoke.  She denies significant  alcohol.  She is disabled because of her back.   FAMILY HISTORY:  Notable for premature coronary disease.   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   MEDICATIONS:  1. Nexium.  2. Carvedilol 12.5 b.i.d.  3. Lisinopril 5 b.i.d.  4. Duragesic patch 75 q. 2 d.  5. Simvastatin.   ALLERGIES:  She has no known drug allergies.   PHYSICAL EXAMINATION:  GENERAL:  She is a middle-aged Caucasian female  appearing her stated age of 26.  VITAL SIGNS:  Her blood pressure today was 100/65 with a pulse of 63.  Her weight was 139 which is relatively stable.  HEENT:  Demonstrate no icterus or xanthoma.  Her neck veins were flat.  The carotids are brisk and full bilaterally without bruits.  BACK:  Without kyphosis, scoliosis.  LUNGS:  Clear.  HEART:  Sounds were regular with an S4.  A 2/6 murmur was heard at the  apex.  ABDOMEN:  Soft with active bowel sounds without midline pulsation or  hepatomegaly.  Femoral pulses were 2+.  Distal pulses were intact.  There is no clubbing,  cyanosis, or edema.  NEUROLOGIC:  Grossly normal.  SKIN:  Warm and dry.   Electrocardiogram dated March 27, 2009 demonstrated sinus rhythm at 76  with intervals of 0.17/0.10/0.39.  There is ST-segment depression mildly  in the inferolateral leads and T-wave flattening.  This was consistent  with left ventricular hypertrophy and mild repolarization abnormalities.  On a couple of electrogram, she had a prominent R prime in lead V1 and  V2.  T-waves were upright.   Review of her Holter monitor dated today demonstrated frequent  ventricular ectopy with two predominant morphologies comprising about  10% of all of her beats.  She had minimum heart rate of 44, a peak heart  rate of 104.  There were certainly runs of AIVR, couplets, bigeminy with   variable morphologies.  The distribution of her PVCs was unfortunately  not well quantitated, but appears to be uninterpretable.   A preliminary report of an echo done today read by Dr. Bettina Gavia  demonstrated an ejection fraction of 40-45% with lateral diffuse  hypokinesia.   IMPRESSION:  1. Nonischemic cardiomyopathy with recent ejection fraction of 40-45%      pending your review.  2. Frequent ventricular ectopy potentially contributing to nonischemic      cardiomyopathy.  3. Chest pain, probably gastrointestinal in origin.  4. Daytime somnolence and easy fatigability, question sleep apnea.   DISCUSSION:  [name] has nonischemic cardiomyopathy with 10% PVCs.  She is certainly much better since you have been aggressive with medical  therapy and ejection fraction seems to have gone into a relatively low  risk group of 40-45%.  In the event that hold up, ICD implantation would  certainly not be indicated.   She has no symptoms related to her ventricular ectopy.  This, however,  begs the question as to whether the ventricular ectopy is contributing  to her cardiomyopathy.  At a burden of about 10%, the answer is  possibly.  The studies have that a burden of 5%-30% PVCs can be  associated with a cardiomyopathy in that with their ablation, there  could be improvement; the likelihood of improvement certainly is higher  in the group in whom more ventricular ectopy is noted.  I wonder whether  MRI scan helps to identify the likelihood of recovery here.  However,  she has multiple morphologies of her ventricular ectopy and I think  continuing medical therapy would be the most appropriate course.  Potential consideration could be given to antiarrhythmic drug therapy  for suppression of ventricular ectopy once her medications has been  maximized to see if there is further augmentation and recovery of left  ventricular systolic function.   Thanks very much for the consultation.     Sincerely,      Deboraha Sprang, MD, [facility]  Electronically Signed   SCK/MedQ  DD: 06/05/2009  DT: 06/06/2009  Job #: [id number]

## 2011-01-08 NOTE — Assessment & Plan Note (Signed)
[facility] OFFICE NOTE   NAME:[name], [name]                       MRN:          [id number]  DATE:02/28/2010                            DOB:          [birth year]    PROBLEM LIST:  1. History of nonischemic cardiomyopathy.  Her ejection fraction was      25-30% in July 2010.  Cardiac catheterization in August 2010 showed      nonobstructive coronary artery disease, normal right-sided      pressures.  Most recent ejection fraction was 50%.  2. Hyperlipidemia.  3. Chronic back pain.  4. Tobacco use.  5. History of frequent PVCs.   INTERVAL HISTORY:  [name] is here for a followup visit.  She was  seen by Dr. Zenia Resides recently.  She was noted to be slightly hyperkalemic  and thus spironolactone was stopped.  She complained of increased lower  extremity edema and was started on furosemide 20 mg once daily.  She  actually has been taking furosemide twice daily since then.  Her edema  has improved.  However, she continues to complain of certain days when  she is extremely fatigued with dizziness and inability to perform her  activities of daily living.  Her dyspnea is stable and has not worsened.  She has stable 3-pillow orthopnea.  She denies any PND.  Her lower  extremity edema has resolved since she has been on furosemide.  She  actually feels slightly better today but as I mentioned usually she gets  episodes of generalized weakness and fatigue on certain days.   CURRENT MEDICATIONS:  1. Lisinopril 5 mg twice daily.  This will be decreased to once daily      today.  2. Simvastatin 20 mg at bedtime.  3. Nexium 40 mg daily.  4. Duragesic patch 75 every 3 days.  5. Aspirin 81 mg daily.  6. Carvedilol 6.25 mg twice daily.  7. Furosemide 20 mg twice daily.  This will be decreased to once      daily.   PHYSICAL EXAMINATION:  GENERAL:  Overall, she appears to be in no acute  distress.  VITAL SIGNS:  Weight  of 132.2 pounds, blood pressure 105/65, pulse is  67, oxygen saturation is 98% on room air.  NECK:  No JVD or carotid bruits.  LUNGS:  Clear to auscultation.  HEART:  Regular rate and rhythm with frequent premature beats.  There  are no gallops or murmurs.  ABDOMEN:  Benign, nontender, nondistended.  EXTREMITIES:  No clubbing, cyanosis, or edema.   IMPRESSION:  Congestive heart failure, chronic with history of reduced  LV systolic function.  Most recent ejection fraction was 50%.  She  continues to be in [place] class II or III on certain  days.  Her Aldactone was stopped recently due to hyperkalemia.  Most of  her symptoms at the present time seems to be generalized fatigue,  weakness, and dizziness especially upon standing up.  I suspect maybe  her blood pressure is running low on certain days.  Given that her  ejection fraction  has improved significantly, I do not think we need to  keep her blood pressure that low.  I will go ahead and change lisinopril  to 5 mg once daily.  I also asked her to cut down on furosemide to once  daily.  The other concerning thing is her continued symptoms of  dizziness and palpitations.  She does have history of frequent PVCs.  I  will go ahead and order 48-hour Holter monitor.  I recommended to the  patient to start an exercise program.  She will follow up in 3 months or  earlier as needed.     Kathlyn Sacramento, MD  Electronically Signed    MA/MedQ  DD: 02/28/2010  DT: 03/01/2010  Job #: [id number]

## 2011-01-08 NOTE — Cardiovascular Report (Signed)
NAME:  [name], [name] NO.:  [id number]   MEDICAL RECORD NO.:  [id number]           PATIENT TYPE:   LOCATION:                                 FACILITY:   PHYSICIAN:  Juanda Bond. Burt Knack, MD  DATE OF BIRTH:  November 27, 1955   DATE OF PROCEDURE:  DATE OF DISCHARGE:                            CARDIAC CATHETERIZATION   PROCEDURE:  1. Right and left heart catheterization.  2. Selective coronary angiography.  3. Left ventricular angiography.   INDICATIONS:  [name] is a 55 year old woman who has developed  reduced LV function.  She underwent an echocardiogram after presenting  with shortness of breath and was found to have an LVEF in the range of  25-30% with mitral regurgitation present.  She presented for evaluation  and was referred for right and left heart catheterization to evaluate  for obstructive CAD as well as hemodynamics.   Risks and indications of procedure were reviewed with the patient.  Informed consent was obtained.  The right groin was prepped, draped, and  anesthetized with 1% lidocaine.  Using modified Seldinger technique, a 4-  French sheath was placed in the right femoral artery and a 6-French  sheath was placed in the right femoral vein.  Standard Judkins catheters  were used for coronary angiography.  An end-hole multipurpose catheter  was used for the right heart catheterization.  Oxygen saturations were  drawn from the pulmonary artery and central aorta.  Fick cardiac output  was calculated.  The patient tolerated the procedure well.  There were  no immediate complications.  All catheter exchanges were performed over  a guidewire.   FINDINGS:  Hemodynamics:  Right atrial pressure A-wave 6, V-wave 5, mean  of 4.  RV pressure is 21/6.  Pulmonary artery pressure is 20/6 with a  mean of 13.  Pulmonary capillary wedge pressure A-wave 8, V-wave 8, mean  of 7.  Left ventricular pressure 113/16.  Aortic pressure is 109/62 with  a mean of 80.  Cardiac  output is 5.1 liters per minute.  Cardiac index  is 3.2 liters per minute per meter squared.  Aortic oxygen saturation  93%.  Pulmonary artery oxygen saturation 72%.   Left ventriculography:  There is severe global LV dysfunction.  The LVEF  is estimated at 20-25%.  There appears to be 2+ mitral regurgitation.   CORONARY ANATOMY:  Right coronary artery:  The RCA is widely patent  throughout its course.  It is a dominant vessel and supplies a PDA and a  single small posterolateral branch.  The mid right coronary artery has a  smooth 20% stenosis just after the first RV marginal branch.   Left mainstem:  The left main coronary artery is angiographically  normal.  There is no significant stenosis present.  The left main  bifurcates into the LAD and left circumflex.   LAD:  The LAD is a large-caliber vessel that courses down and reaches  the LV apex.  There are 2 diagonal branches arising from the LAD.  Both  diagonals are angiographically normal.  There is a shelf-like, mild  plaque in the proximal LAD.  There is an associated 20% stenosis.  The  remaining portions of the proximal mid and distal LAD have no  significant stenosis.   Left circumflex:  The left circumflex is a large-caliber vessel  throughout the AV groove.  There are 2 small OM branches followed by a  large third OM and a large left posterolateral branch.  There is no  significant stenosis throughout the left circumflex coronary artery.   FINAL ASSESSMENT:  1. Minimal nonobstructive coronary artery disease as outlined above.  2. Normal hemodynamics.  3. Severe left ventricular systolic dysfunction.  4. Moderate mitral regurgitation.   RECOMMENDATIONS:  Recommend medical therapy for nonischemic  cardiomyopathy.  The patient will follow up with Dr. Zenia Resides for further  care.      Juanda Bond. Burt Knack, MD  Electronically Signed     MDC/MEDQ  D:  04/04/2009  T:  04/04/2009  Job:  [id number]   cc:   Arlee Muslim, MD   Dr. Henrene Pastor

## 2011-01-08 NOTE — Assessment & Plan Note (Signed)
[name] OFFICE NOTE   NAME:[name], Crystal STONESIFER                       MRN:          [id number]  DATE:04/07/2009                            DOB:          May 07, 1956    SUBJECTIVE:  The patient states that since her last visit she has felt  about the same.  She denies any chest discomfort but does have dyspnea  on exertion.  She states that she gets shortness of breath walking to  her mailbox.  She complains of three pillow orthopnea but denies any  significant edema or PND.  Since her last visit she had a left and right  heart catheterization, the results of which are below.  The patient  denies any palpitations or syncopal episodes.  She states she has been  compliant with the medications that we had prescribed at the last visit  and tolerating them well other then some mild GI upset with the Coreg.   EXAMINATION:  Blood pressure is 107/72, pulse is 86, she weighs 138  pounds, saturating 98% on room air.  GENERAL:  No acute distress.  HEENT:  Nonfocal.  NECK:  Supple.  HEART:  Regular rate and rhythm with a 1/6 systolic murmur heard at the  left sternal border.  LUNGS:  Clear bilaterally.  ABDOMEN:  Soft, nontender.  EXTREMITIES:  Without edema.  SKIN:  Warm and dry.  Her right groin at the site of the cath shows no  hematoma or erythema.  There is mild tenderness over the cath area.  PULSES:  She has 2+ bilateral femoral pulses.   I reviewed the patient's left heart catheterization, shows minimal  nonobstructive coronary disease and an ejection fraction of 20% - 25%  with 2+ MR.  Her right heart catheterization showed a wedge pressure of  8 and normal pulmonary artery pressures.  The cholesterol profile showed  an LDL of 164 and an HDL of 78.   ASSESSMENT:  This is a 55 year old white female with a nonischemic  cardiomyopathy, New York Heart Association Class III.  Her hemodynamics  per her right heart  catheterization are normal.  Patient does have a  family history of cardiomyopathy so this may represent the familial  cardiomyopathy, however, other potential etiologies will be  investigated.  Her TSH was borderline for hyperthyroidism however her  levels were still within the normal range.   PLAN:  Today in clinic we will increase her Coreg to 6.25 mg p.o. b.i.d.  She should continue on the lisinopril 5 mg daily.  We will also start  her on simvastatin 20 mg p.o. nightly in order to address her  hyperlipidemia.  We will check an HIV test, iron panel and an ANA in  order to  further investigate the potential causes of her nonischemic  cardiomyopathy.  We will see the  patient back in three week's time at which we will further titrate her  medications as tolerated and likely start on spironolactone.     Arlee Muslim, MD  Electronically Signed    SGA/MedQ  DD: 04/07/2009  DT: 04/07/2009  Job #: [id number]

## 2011-01-11 NOTE — Letter (Signed)
April 10, 2009    [place], [place]   RE:  LA, BROZYNA  MRN:  [id number]  /  DOB:  Sep 25, 1955   Dear Ms. Moorhouse:   I am writing to you regarding your results of the labs you had drawn  last week.  Everything was within normal limits including a negative HIV  test and a normal iron panel.  At this time, as we discussed in clinic,  taking all of your medications regularly is of the utmost importance.  I  would also strongly recommend that you do not drink any alcohol as  alcohol can be a contributing factor.  We look forward to seeing you at  your next office appointment.  If any problems arise in the interim,  please feel free to contact our office at [phone].    Sincerely,      Arlee Muslim, MD  Electronically Signed    SGA/MedQ  DD: 04/10/2009  DT: 04/10/2009  Job #: [id number]

## 2011-02-19 ENCOUNTER — Encounter: Payer: Self-pay | Admitting: Cardiovascular Disease

## 2011-04-17 ENCOUNTER — Encounter: Payer: Self-pay | Admitting: Cardiovascular Disease

## 2011-05-17 LAB — POCT HEMOGLOBIN-HEMACUE
Hemoglobin: 14.3
Operator id: 268271

## 2011-05-28 LAB — BASIC METABOLIC PANEL
BUN: 8
CO2: 30
Calcium: 9.9
Chloride: 103
Creatinine, Ser: 0.54
GFR calc Af Amer: >60
GFR calc non Af Amer: >60
Glucose, Bld: 94
Potassium: 4.7
Sodium: 139

## 2011-05-28 LAB — CBC
HCT: 39.8
Hemoglobin: 13.3
MCHC: 33.4
MCV: 91.5
Platelets: 394
RBC: 4.35
RDW: 11.9
WBC: 11 — ABNORMAL HIGH

## 2011-07-01 ENCOUNTER — Encounter: Payer: Self-pay | Admitting: Cardiovascular Disease

## 2011-08-07 ENCOUNTER — Encounter: Payer: Self-pay | Admitting: Cardiovascular Disease

## 2011-09-03 DIAGNOSIS — M47817 Spondylosis without myelopathy or radiculopathy, lumbosacral region: Secondary | ICD-10-CM | POA: Diagnosis not present

## 2011-09-03 DIAGNOSIS — M5380 Other specified dorsopathies, site unspecified: Secondary | ICD-10-CM | POA: Diagnosis not present

## 2011-09-03 DIAGNOSIS — M25569 Pain in unspecified knee: Secondary | ICD-10-CM | POA: Diagnosis not present

## 2011-09-03 DIAGNOSIS — M5137 Other intervertebral disc degeneration, lumbosacral region: Secondary | ICD-10-CM | POA: Diagnosis not present

## 2011-09-19 DIAGNOSIS — J019 Acute sinusitis, unspecified: Secondary | ICD-10-CM | POA: Diagnosis not present

## 2011-10-03 DIAGNOSIS — M25569 Pain in unspecified knee: Secondary | ICD-10-CM | POA: Diagnosis not present

## 2011-10-03 DIAGNOSIS — M5380 Other specified dorsopathies, site unspecified: Secondary | ICD-10-CM | POA: Diagnosis not present

## 2011-10-03 DIAGNOSIS — M5137 Other intervertebral disc degeneration, lumbosacral region: Secondary | ICD-10-CM | POA: Diagnosis not present

## 2011-10-03 DIAGNOSIS — G894 Chronic pain syndrome: Secondary | ICD-10-CM | POA: Diagnosis not present

## 2011-10-07 DIAGNOSIS — I10 Essential (primary) hypertension: Secondary | ICD-10-CM | POA: Diagnosis not present

## 2011-10-07 DIAGNOSIS — E7800 Pure hypercholesterolemia, unspecified: Secondary | ICD-10-CM | POA: Diagnosis not present

## 2011-10-07 DIAGNOSIS — I509 Heart failure, unspecified: Secondary | ICD-10-CM | POA: Diagnosis not present

## 2011-10-07 DIAGNOSIS — J45901 Unspecified asthma with (acute) exacerbation: Secondary | ICD-10-CM | POA: Diagnosis not present

## 2011-10-07 DIAGNOSIS — J449 Chronic obstructive pulmonary disease, unspecified: Secondary | ICD-10-CM | POA: Diagnosis not present

## 2011-10-07 DIAGNOSIS — G8929 Other chronic pain: Secondary | ICD-10-CM | POA: Diagnosis not present

## 2011-10-10 DIAGNOSIS — I498 Other specified cardiac arrhythmias: Secondary | ICD-10-CM | POA: Diagnosis not present

## 2011-10-15 DIAGNOSIS — M25549 Pain in joints of unspecified hand: Secondary | ICD-10-CM | POA: Diagnosis not present

## 2011-10-15 DIAGNOSIS — G5620 Lesion of ulnar nerve, unspecified upper limb: Secondary | ICD-10-CM | POA: Diagnosis not present

## 2011-10-15 DIAGNOSIS — M25539 Pain in unspecified wrist: Secondary | ICD-10-CM | POA: Diagnosis not present

## 2011-10-31 DIAGNOSIS — M5380 Other specified dorsopathies, site unspecified: Secondary | ICD-10-CM | POA: Diagnosis not present

## 2011-10-31 DIAGNOSIS — M5137 Other intervertebral disc degeneration, lumbosacral region: Secondary | ICD-10-CM | POA: Diagnosis not present

## 2011-10-31 DIAGNOSIS — G894 Chronic pain syndrome: Secondary | ICD-10-CM | POA: Diagnosis not present

## 2011-10-31 DIAGNOSIS — M47817 Spondylosis without myelopathy or radiculopathy, lumbosacral region: Secondary | ICD-10-CM | POA: Diagnosis not present

## 2011-11-12 DIAGNOSIS — K5900 Constipation, unspecified: Secondary | ICD-10-CM | POA: Diagnosis not present

## 2011-11-12 DIAGNOSIS — K625 Hemorrhage of anus and rectum: Secondary | ICD-10-CM | POA: Diagnosis not present

## 2011-12-05 DIAGNOSIS — R0789 Other chest pain: Secondary | ICD-10-CM | POA: Diagnosis not present

## 2011-12-05 DIAGNOSIS — Z95810 Presence of automatic (implantable) cardiac defibrillator: Secondary | ICD-10-CM | POA: Diagnosis not present

## 2011-12-05 DIAGNOSIS — I472 Ventricular tachycardia: Secondary | ICD-10-CM | POA: Diagnosis not present

## 2011-12-05 DIAGNOSIS — I509 Heart failure, unspecified: Secondary | ICD-10-CM | POA: Diagnosis not present

## 2011-12-19 DIAGNOSIS — I509 Heart failure, unspecified: Secondary | ICD-10-CM | POA: Diagnosis not present

## 2011-12-19 DIAGNOSIS — I501 Left ventricular failure: Secondary | ICD-10-CM | POA: Diagnosis not present

## 2011-12-19 DIAGNOSIS — Z01810 Encounter for preprocedural cardiovascular examination: Secondary | ICD-10-CM | POA: Diagnosis not present

## 2011-12-19 DIAGNOSIS — R079 Chest pain, unspecified: Secondary | ICD-10-CM | POA: Diagnosis not present

## 2011-12-26 ENCOUNTER — Other Ambulatory Visit: Payer: Self-pay | Admitting: Neurosurgery

## 2011-12-31 DIAGNOSIS — J449 Chronic obstructive pulmonary disease, unspecified: Secondary | ICD-10-CM | POA: Diagnosis not present

## 2011-12-31 DIAGNOSIS — I509 Heart failure, unspecified: Secondary | ICD-10-CM | POA: Diagnosis not present

## 2011-12-31 DIAGNOSIS — J45901 Unspecified asthma with (acute) exacerbation: Secondary | ICD-10-CM | POA: Diagnosis not present

## 2011-12-31 DIAGNOSIS — E785 Hyperlipidemia, unspecified: Secondary | ICD-10-CM | POA: Diagnosis not present

## 2011-12-31 DIAGNOSIS — Z79899 Other long term (current) drug therapy: Secondary | ICD-10-CM | POA: Diagnosis not present

## 2012-01-02 DIAGNOSIS — E785 Hyperlipidemia, unspecified: Secondary | ICD-10-CM | POA: Diagnosis not present

## 2012-01-02 DIAGNOSIS — Z95810 Presence of automatic (implantable) cardiac defibrillator: Secondary | ICD-10-CM | POA: Diagnosis not present

## 2012-01-02 DIAGNOSIS — I509 Heart failure, unspecified: Secondary | ICD-10-CM | POA: Diagnosis not present

## 2012-01-02 DIAGNOSIS — I472 Ventricular tachycardia: Secondary | ICD-10-CM | POA: Diagnosis not present

## 2012-01-02 DIAGNOSIS — R0789 Other chest pain: Secondary | ICD-10-CM | POA: Diagnosis not present

## 2012-01-08 ENCOUNTER — Encounter (HOSPITAL_COMMUNITY): Payer: Self-pay | Admitting: Respiratory Therapy

## 2012-01-10 ENCOUNTER — Encounter (HOSPITAL_COMMUNITY)
Admission: RE | Admit: 2012-01-10 | Discharge: 2012-01-10 | Disposition: A | Discharge status: Home / Self Care | Payer: Medicare Other | Source: Ambulatory Visit | Attending: Neurosurgery | Admitting: Neurosurgery

## 2012-01-10 ENCOUNTER — Encounter (HOSPITAL_COMMUNITY): Payer: Self-pay

## 2012-01-10 ENCOUNTER — Ambulatory Visit (HOSPITAL_COMMUNITY)
Admission: RE | Admit: 2012-01-10 | Discharge: 2012-01-10 | Disposition: A | Discharge status: Home / Self Care | Payer: Medicare Other | Source: Ambulatory Visit | Attending: Neurosurgery | Admitting: Neurosurgery

## 2012-01-10 DIAGNOSIS — Z950 Presence of cardiac pacemaker: Secondary | ICD-10-CM | POA: Insufficient documentation

## 2012-01-10 DIAGNOSIS — Z01812 Encounter for preprocedural laboratory examination: Secondary | ICD-10-CM | POA: Insufficient documentation

## 2012-01-10 DIAGNOSIS — Z01811 Encounter for preprocedural respiratory examination: Secondary | ICD-10-CM | POA: Diagnosis not present

## 2012-01-10 DIAGNOSIS — Z01810 Encounter for preprocedural cardiovascular examination: Secondary | ICD-10-CM | POA: Diagnosis not present

## 2012-01-10 DIAGNOSIS — G5620 Lesion of ulnar nerve, unspecified upper limb: Secondary | ICD-10-CM | POA: Insufficient documentation

## 2012-01-10 HISTORY — DX: Unspecified osteoarthritis, unspecified site: M19.90

## 2012-01-10 HISTORY — DX: Heart failure, unspecified: I50.9

## 2012-01-10 HISTORY — DX: Gastro-esophageal reflux disease without esophagitis: K21.9

## 2012-01-10 HISTORY — DX: Presence of automatic (implantable) cardiac defibrillator: Z95.810

## 2012-01-10 LAB — SURGICAL PCR SCREEN
MRSA, PCR: NEGATIVE
Staphylococcus aureus: POSITIVE — AB

## 2012-01-10 LAB — BASIC METABOLIC PANEL
BUN: 11 mg/dL (ref 6–23)
CO2: 29 mEq/L (ref 19–32)
Calcium: 10 mg/dL (ref 8.4–10.5)
Chloride: 100 mEq/L (ref 96–112)
Creatinine, Ser: 0.6 mg/dL (ref 0.50–1.10)
GFR calc Af Amer: >90 mL/min (ref >90)
GFR calc non Af Amer: >90 mL/min (ref >90)
Glucose, Bld: 92 mg/dL (ref 70–99)
Potassium: 3.8 mEq/L (ref 3.5–5.1)
Sodium: 140 mEq/L (ref 135–145)

## 2012-01-10 LAB — CBC
HCT: 37 % (ref 36.0–46.0)
Hemoglobin: 12.5 g/dL (ref 12.0–15.0)
MCH: 29.6 pg (ref 26.0–34.0)
MCHC: 33.8 g/dL (ref 30.0–36.0)
MCV: 87.5 fL (ref 78.0–100.0)
Platelets: 317 10*3/uL (ref 150–400)
RBC: 4.23 MIL/uL (ref 3.87–5.11)
RDW: 12 % (ref 11.5–15.5)
WBC: 14.5 10*3/uL — ABNORMAL HIGH (ref 4.0–10.5)

## 2012-01-10 NOTE — Progress Notes (Signed)
Requested and received cardiac clearance from [facility]. Faxed orders for implanted cardiac device to [facility] office at [facility].

## 2012-01-10 NOTE — Progress Notes (Signed)
Called Dr Arnoldo Morale office and informed Manuela Schwartz that orders were not in signed and released but under other orders.

## 2012-01-10 NOTE — Pre-Procedure Instructions (Signed)
[name]  01/10/2012   Your procedure is scheduled on:  Wednesday Jan 22, 2012.  Report to [place] at 0600 AM.  Call this number if you have problems the morning of surgery: [phone]   Remember:   Do not eat food:After Midnight.  May have clear liquids: up to 4 Hours before arrival until 0200 am.  Clear liquids include soda, tea, black coffee, apple or grape juice, broth.  Take these medicines the morning of surgery with A SIP OF WATER: Albuterol inhaler if needed for shortness of breath, Carvedilol (Coreg), Esomeprazole (Prilosec), Advair inhaler if needed, and Isosorbide (Imdur).   Do not wear jewelry, make-up or nail polish.  Do not wear lotions, powders, or perfumes. You may wear deodorant.  Do not shave 48 hours prior to surgery. Men may shave face and neck.  Do not bring valuables to the hospital.  Contacts, dentures or bridgework may not be worn into surgery.  Leave suitcase in the car. After surgery it may be brought to your room.  For patients admitted to the hospital, checkout time is 11:00 AM the day of discharge.   Patients discharged the day of surgery will not be allowed to drive home.  Name and phone number of your driver:   Special Instructions: CHG Shower Use Special Wash: 1/2 bottle night before surgery and 1/2 bottle morning of surgery.   Please read over the following fact sheets that you were given: Pain Booklet, Coughing and Deep Breathing, MRSA Information and Surgical Site Infection Prevention

## 2012-01-13 NOTE — Consult Note (Addendum)
Anesthesia Chart Review:  Patient is a 56 year old female scheduled for left ulnar nerve decompression on 01/22/12.  History includes CHF, non-ischemic CM, s/p AICD ([employer] by RN notes), former smoker, GERD, chronic back pian, HLD, arthritis, prior neck surgery.  PCP is Dr. Reinaldo Meeker.  Cardiologist is Dr. Agustin Cree at [facility].  She had a stress test on 12/19/11 that showed no evidence of ischemia, EF calculated at 36% (visually 45%).  EKG from 01/10/12 showed demand pacemaker with atrial paced complexes, SR with occasional PVCs, incomplete right BBB, minimal voltage criteria for LVH.  Labs noted.  WBC 14.5.    CXR report from 01/10/12 showed: Cardiomediastinal silhouette is stable. No acute  infiltrate or pulmonary edema. There is a dual lead cardiac  pacemaker with leads in right atrium and right ventricle. Metallic fixation plate cervical spine again noted. Post cholecystectomy surgical clips are noted in the right upper abdomen.   ICD perioperative Rx form and additional Cardiology notes are still pending from Dr. Jill Poling office.  Will follow-up when available, but with recent stress test without ischemia would anticipate she can proceed as planned.  Addendum: 01/14/12 1200  Reviewed Dr. Wendy Poet note from 01/02/12.  According to his note, she should be an acceptable candidate for surgery and general anesthesia.  He recommended continuing her B-blocker which she is still on according to her medication list.  We have not yet received the ICD form from [facility] (nursing staff to follow-up), but his note recommends using a magnet.    Myra Gianotti, PA-C

## 2012-01-17 DIAGNOSIS — I499 Cardiac arrhythmia, unspecified: Secondary | ICD-10-CM | POA: Diagnosis not present

## 2012-01-21 NOTE — Progress Notes (Signed)
Spoke with Adreinne at office of Dr Kerby Nora...refaxed periop rx for icd.Marland KitchenMarland Kitchen

## 2012-01-22 DIAGNOSIS — Z95810 Presence of automatic (implantable) cardiac defibrillator: Secondary | ICD-10-CM | POA: Diagnosis not present

## 2012-01-22 DIAGNOSIS — I509 Heart failure, unspecified: Secondary | ICD-10-CM | POA: Diagnosis not present

## 2012-01-22 DIAGNOSIS — K219 Gastro-esophageal reflux disease without esophagitis: Secondary | ICD-10-CM | POA: Diagnosis not present

## 2012-01-22 DIAGNOSIS — G5620 Lesion of ulnar nerve, unspecified upper limb: Secondary | ICD-10-CM | POA: Diagnosis not present

## 2012-01-23 ENCOUNTER — Ambulatory Visit (HOSPITAL_COMMUNITY)
Admission: RE | Admit: 2012-01-23 | Discharge: 2012-01-23 | Disposition: A | Discharge status: Home / Self Care | Payer: Medicare Other | Source: Ambulatory Visit | Attending: Neurosurgery | Admitting: Neurosurgery

## 2012-01-23 ENCOUNTER — Encounter (HOSPITAL_COMMUNITY): Payer: Self-pay | Admitting: Vascular Surgery

## 2012-01-23 ENCOUNTER — Ambulatory Visit (HOSPITAL_COMMUNITY): Payer: Medicare Other | Admitting: Vascular Surgery

## 2012-01-23 ENCOUNTER — Encounter (HOSPITAL_COMMUNITY)
Admission: RE | Disposition: A | Discharge status: Home / Self Care | Payer: Self-pay | Source: Ambulatory Visit | Attending: Neurosurgery

## 2012-01-23 DIAGNOSIS — K219 Gastro-esophageal reflux disease without esophagitis: Secondary | ICD-10-CM | POA: Insufficient documentation

## 2012-01-23 DIAGNOSIS — I509 Heart failure, unspecified: Secondary | ICD-10-CM | POA: Diagnosis not present

## 2012-01-23 DIAGNOSIS — G5620 Lesion of ulnar nerve, unspecified upper limb: Secondary | ICD-10-CM | POA: Insufficient documentation

## 2012-01-23 DIAGNOSIS — Z95810 Presence of automatic (implantable) cardiac defibrillator: Secondary | ICD-10-CM | POA: Diagnosis not present

## 2012-01-23 HISTORY — PX: ULNAR NERVE TRANSPOSITION: SHX2595

## 2012-01-23 SURGERY — ULNAR NERVE DECOMPRESSION/TRANSPOSITION
Anesthesia: General | Laterality: Left | Wound class: Clean

## 2012-01-23 MED ORDER — HYDROMORPHONE HCL PF 1 MG/ML IJ SOLN
0.2500 mg | INTRAMUSCULAR | Status: DC | PRN
  Administered 2012-01-23 (×2): 0.25 mg via INTRAVENOUS

## 2012-01-23 MED ORDER — SODIUM CHLORIDE 0.9 % IR SOLN
Status: DC | PRN
  Administered 2012-01-23: 19:00:00

## 2012-01-23 MED ORDER — PROPOFOL 10 MG/ML IV BOLUS
INTRAVENOUS | Status: DC | PRN
  Administered 2012-01-23: 150 mg via INTRAVENOUS

## 2012-01-23 MED ORDER — CEFAZOLIN SODIUM 1-5 GM-% IV SOLN
INTRAVENOUS | Status: DC | PRN
  Administered 2012-01-23: 1 g via INTRAVENOUS

## 2012-01-23 MED ORDER — CEFAZOLIN SODIUM 1-5 GM-% IV SOLN: 1.0000 g | INTRAVENOUS | Status: DC

## 2012-01-23 MED ORDER — ONDANSETRON HCL 4 MG/2ML IJ SOLN
INTRAMUSCULAR | Status: DC | PRN
  Administered 2012-01-23: 4 mg via INTRAVENOUS

## 2012-01-23 MED ORDER — BACITRACIN 50000 UNITS IM SOLR
INTRAMUSCULAR | Status: AC
  Filled 2012-01-23: qty 1

## 2012-01-23 MED ORDER — MIDAZOLAM HCL 5 MG/5ML IJ SOLN
INTRAMUSCULAR | Status: DC | PRN
  Administered 2012-01-23: 1 mg via INTRAVENOUS

## 2012-01-23 MED ORDER — CEFAZOLIN SODIUM 1-5 GM-% IV SOLN
INTRAVENOUS | Status: AC
  Filled 2012-01-23: qty 50

## 2012-01-23 MED ORDER — BACITRACIN ZINC 500 UNIT/GM EX OINT
TOPICAL_OINTMENT | CUTANEOUS | Status: DC | PRN
  Administered 2012-01-23: 1 via TOPICAL

## 2012-01-23 MED ORDER — SODIUM CHLORIDE 0.9 % IV SOLN
INTRAVENOUS | Status: AC
  Filled 2012-01-23: qty 500

## 2012-01-23 MED ORDER — ONDANSETRON HCL 4 MG/2ML IJ SOLN: 4.0000 mg | Freq: Once | INTRAMUSCULAR | Status: DC | PRN

## 2012-01-23 MED ORDER — PHENYLEPHRINE HCL 10 MG/ML IJ SOLN
INTRAMUSCULAR | Status: DC | PRN
  Administered 2012-01-23: 80 ug via INTRAVENOUS

## 2012-01-23 MED ORDER — FENTANYL CITRATE 0.05 MG/ML IJ SOLN
INTRAMUSCULAR | Status: DC | PRN
  Administered 2012-01-23 (×3): 50 ug via INTRAVENOUS

## 2012-01-23 MED ORDER — HYDROMORPHONE HCL PF 1 MG/ML IJ SOLN
INTRAMUSCULAR | Status: AC
  Filled 2012-01-23: qty 1

## 2012-01-23 MED ORDER — 0.9 % SODIUM CHLORIDE (POUR BTL) OPTIME
TOPICAL | Status: DC | PRN
  Administered 2012-01-23: 1000 mL

## 2012-01-23 MED ORDER — LACTATED RINGERS IV SOLN
INTRAVENOUS | Status: DC | PRN
  Administered 2012-01-23: 18:00:00 via INTRAVENOUS

## 2012-01-23 MED ORDER — OXYCODONE-ACETAMINOPHEN 5-325 MG PO TABS: 1.0000 | ORAL_TABLET | ORAL | Status: DC | PRN

## 2012-01-23 MED ORDER — LIDOCAINE HCL (CARDIAC) 20 MG/ML IV SOLN
INTRAVENOUS | Status: DC | PRN
  Administered 2012-01-23: 100 mg via INTRAVENOUS

## 2012-01-23 MED ORDER — BUPIVACAINE-EPINEPHRINE 0.5% -1:200000 IJ SOLN
INTRAMUSCULAR | Status: DC | PRN
  Administered 2012-01-23: 5 mL

## 2012-01-23 SURGICAL SUPPLY — 44 items
APL SKNCLS STERI-STRIP NONHPOA (GAUZE/BANDAGES/DRESSINGS) ×1
BAG DECANTER FOR FLEXI CONT (MISCELLANEOUS) ×2 IMPLANT
BANDAGE ELASTIC 4 VELCRO ST LF (GAUZE/BANDAGES/DRESSINGS) ×2 IMPLANT
BANDAGE GAUZE 4  KLING STR (GAUZE/BANDAGES/DRESSINGS) ×2 IMPLANT
BENZOIN TINCTURE PRP APPL 2/3 (GAUZE/BANDAGES/DRESSINGS) ×2 IMPLANT
BLADE SURG 15 STRL LF DISP TIS (BLADE) ×1 IMPLANT
BLADE SURG 15 STRL SS (BLADE) ×2
BRUSH SCRUB EZ PLAIN DRY (MISCELLANEOUS) ×2 IMPLANT
CLOTH BEACON ORANGE TIMEOUT ST (SAFETY) ×2 IMPLANT
CORDS BIPOLAR (ELECTRODE) ×2 IMPLANT
DRAPE EXTREMITY T 121X128X90 (DRAPE) ×2 IMPLANT
DRAPE PROXIMA HALF (DRAPES) ×3 IMPLANT
GAUZE SPONGE 4X4 16PLY XRAY LF (GAUZE/BANDAGES/DRESSINGS) ×2 IMPLANT
GLOVE BIO SURGEON STRL SZ 6.5 (GLOVE) ×1 IMPLANT
GLOVE BIO SURGEON STRL SZ8.5 (GLOVE) ×2 IMPLANT
GLOVE BIOGEL PI IND STRL 6.5 (GLOVE) IMPLANT
GLOVE BIOGEL PI INDICATOR 6.5 (GLOVE) ×1
GLOVE EXAM NITRILE LRG STRL (GLOVE) IMPLANT
GLOVE EXAM NITRILE MD LF STRL (GLOVE) ×2 IMPLANT
GLOVE EXAM NITRILE XL STR (GLOVE) IMPLANT
GLOVE EXAM NITRILE XS STR PU (GLOVE) IMPLANT
GLOVE SS BIOGEL STRL SZ 8 (GLOVE) ×1 IMPLANT
GLOVE SUPERSENSE BIOGEL SZ 8 (GLOVE) ×1
GOWN BRE IMP SLV AUR LG STRL (GOWN DISPOSABLE) ×2 IMPLANT
GOWN BRE IMP SLV AUR XL STRL (GOWN DISPOSABLE) ×2 IMPLANT
KIT BASIN OR (CUSTOM PROCEDURE TRAY) ×2 IMPLANT
KIT ROOM TURNOVER OR (KITS) ×2 IMPLANT
MARKER SKIN DUAL TIP RULER LAB (MISCELLANEOUS) ×2 IMPLANT
NDL HYPO 25X1 1.5 SAFETY (NEEDLE) ×1 IMPLANT
NEEDLE HYPO 25X1 1.5 SAFETY (NEEDLE) ×2 IMPLANT
NS IRRIG 1000ML POUR BTL (IV SOLUTION) ×2 IMPLANT
PACK SURGICAL SETUP 50X90 (CUSTOM PROCEDURE TRAY) ×2 IMPLANT
PAD ARMBOARD 7.5X6 YLW CONV (MISCELLANEOUS) ×2 IMPLANT
SPONGE GAUZE 4X4 12PLY (GAUZE/BANDAGES/DRESSINGS) ×2 IMPLANT
STOCKINETTE 4X48 STRL (DRAPES) ×2 IMPLANT
STRIP CLOSURE SKIN 1/2X4 (GAUZE/BANDAGES/DRESSINGS) ×2 IMPLANT
SUT ETHILON 3 0 PS 1 (SUTURE) ×2 IMPLANT
SUT VIC AB 3-0 SH 8-18 (SUTURE) ×2 IMPLANT
SYR BULB 3OZ (MISCELLANEOUS) ×2 IMPLANT
SYR CONTROL 10ML LL (SYRINGE) ×2 IMPLANT
TOWEL OR 17X24 6PK STRL BLUE (TOWEL DISPOSABLE) ×5 IMPLANT
TUBE CONNECTING 12X1/4 (SUCTIONS) ×2 IMPLANT
UNDERPAD 30X30 INCONTINENT (UNDERPADS AND DIAPERS) ×2 IMPLANT
WATER STERILE IRR 1000ML POUR (IV SOLUTION) ×1 IMPLANT

## 2012-01-23 NOTE — Progress Notes (Signed)
Spoke with Marcia/ [employer] rep., she will be available for this pt.'s  Case at 1700 for [employer] ICD management .

## 2012-01-23 NOTE — Anesthesia Preprocedure Evaluation (Addendum)
Anesthesia Evaluation  Patient identified by MRN, date of birth, ID band Patient awake    Reviewed: Allergy & Precautions, H&P , NPO status , Patient's Chart, lab work & pertinent test results, reviewed documented beta blocker date and time   History of Anesthesia Complications Negative for: history of anesthetic complications  Airway Mallampati: II TM Distance: >3 FB Neck ROM: Full    Dental  (+) Partial Upper, Dental Advisory Given and Teeth Intact   Pulmonary asthma ,  breath sounds clear to auscultation        Cardiovascular +CHF + dysrhythmias + Cardiac Defibrillator Rhythm:Regular Rate:Normal     Neuro/Psych negative neurological ROS  negative psych ROS   GI/Hepatic Neg liver ROS, GERD-  Controlled,  Endo/Other  negative endocrine ROS  Renal/GU negative Renal ROS  negative genitourinary   Musculoskeletal negative musculoskeletal ROS (+)   Abdominal   Peds  Hematology negative hematology ROS (+)   Anesthesia Other Findings   Reproductive/Obstetrics negative OB ROS                         Anesthesia Physical Anesthesia Plan  ASA: III  Anesthesia Plan: General   Post-op Pain Management:    Induction: Intravenous  Airway Management Planned: LMA  Additional Equipment:   Intra-op Plan:   Post-operative Plan: Extubation in OR  Informed Consent: I have reviewed the patients History and Physical, chart, labs and discussed the procedure including the risks, benefits and alternatives for the proposed anesthesia with the patient or authorized representative who has indicated his/her understanding and acceptance.   Dental advisory given  Plan Discussed with: CRNA, Anesthesiologist and Surgeon  Anesthesia Plan Comments:         Anesthesia Quick Evaluation

## 2012-01-23 NOTE — Preoperative (Signed)
Beta Blockers   Reason not to administer Beta Blockers:Not Applicable 

## 2012-01-23 NOTE — Progress Notes (Signed)
Conversation w Dr Glennon Mac, Anesth. regarding Dr. Cecelia Byars note & plan for management of ICD.  No further order or activitiy necessary.

## 2012-01-23 NOTE — Op Note (Signed)
Brief history: The patient is a 56 year old white female who was previously suffered from left ulnar neuropathy. She's undergo cone a previous left ulnar nerve neurolysis at the elbow by another physician. She has had recurrent symptoms of ulnar neuropathy. This was this affirmed with NCV EMGs. I discussed the various treatment options with the patient including a redo left ulnar nerve neurolysis at the elbow. The patient has weighed the risks, benefits, and alternatives surgery and decided proceed with the operation.  Preop diagnosis: Left ulnar neuropathy at the elbow  Postop diagnosis: The same  Procedure: Left ulnar nerve redo neurolysis at the elbow  Surgeon: Dr. Earle Gell  Assistant: None  Anesthesia: Gen. endotracheal with LMA  Estimated blood loss: Minimal  Specimens: None  Consultations: None  Drains: None  Description of procedure: The patient was brought to the operating room by the anesthesia team. L. MA general anesthesia was induced. The patient remained in the supine position. Her left upper extremity was then prepared with Betadine scrub and Betadine solution. Sterile drapes were applied. I then injected the area to be incised with Marcaine solution. I used a 15 blade scalpel to incise the patient's previous incision at the ulnar groove and extended both proximally and distally. I used bipolar electrocautery for hemostasis. I inserted the Weitlanter retractor for exposure. I dissected deeper with the Metzenbaum scissors and identified the ulnar nerve at the ulnar groove. There was some scar tissue surrounding the nerve both proximally and distally. I carefully dissected along the ulnar nerve and ligated the scar tissue. I got a good decompression both proximally and distally. I then obtained hemostasis with bipolar electrocautery. I then removed the retractors. I then reapproximated patient's subcutaneous tissue with interrupted 3-0 Vicryl suture. I reapproximated the skin  with Steri-Strips and benzoin. The wound was then coated with bacitracin ointment. A sterile dressing was applied. The drapes were removed. The patient was subtotally exited by the anesthesia team and transported to the post anesthesia care unit in stable condition. All sponge instrument and needle counts were reportedly correct at the end this case.

## 2012-01-23 NOTE — Anesthesia Postprocedure Evaluation (Signed)
  Anesthesia Post-op Note  Patient: [name]  Procedure(s) Performed: Procedure(s) (LRB): ULNAR NERVE DECOMPRESSION/TRANSPOSITION (Left)  Patient Location: PACU  Anesthesia Type: General  Level of Consciousness: awake, alert  and oriented  Airway and Oxygen Therapy: Patient Spontanous Breathing  Post-op Pain: mild  Post-op Assessment: Post-op Vital signs reviewed  Post-op Vital Signs: Reviewed  Complications: No apparent anesthesia complications and her AICD was reactivated by the [employer] rep before discharge.

## 2012-01-23 NOTE — Transfer of Care (Signed)
Immediate Anesthesia Transfer of Care Note  Patient: [name]  Procedure(s) Performed: Procedure(s) (LRB): ULNAR NERVE DECOMPRESSION/TRANSPOSITION (Left)  Patient Location: PACU  Anesthesia Type: General  Level of Consciousness: awake, alert , oriented and patient cooperative  Airway & Oxygen Therapy: Patient Spontanous Breathing and Patient connected to nasal cannula oxygen  Post-op Assessment: Report given to PACU RN, Post -op Vital signs reviewed and stable and Patient moving all extremities  Post vital signs: Reviewed and stable  Complications: No apparent anesthesia complications

## 2012-01-23 NOTE — Progress Notes (Signed)
Ms. Sortor upper partial was returned to her and she placed them herself.

## 2012-01-23 NOTE — Discharge Summary (Signed)
Physician Discharge Summary  Patient ID: [name] MRN: [id number] DOB/AGE: 02/23/1956 56 y.o.  Admit date: 01/23/2012 Discharge date: 01/23/2012  Admission Diagnoses: Left ulnar neuropathy at the elbow  Discharge Diagnoses: The same Active Problems:  * No active hospital problems. *    Discharged Condition: good  Hospital Course: I performed a left ulnar nerve neurolysis at the elbow at [facility] on 01/23/2012. The surgery went well. The patient's postoperative course was unremarkable. The patient's AICD was turned back on in the PACU.  Consults: none Significant Diagnostic Studies: None Treatments: Left ulnar nerve neurolysis at the elbow Discharge Exam: Blood pressure 111/71, pulse 76, temperature 98.5 F (36.9 C), temperature source Oral, resp. rate 18, SpO2 95.00%. The patient is alert and pleasant. Her strength is grossly normal in her bilateral hands. Her dressing is clean and dry.  Disposition: Home  Discharge Orders    Future Orders Please Complete By Expires   Diet - low sodium heart healthy      Increase activity slowly      Discharge instructions      Comments:   All [phone] for a followup appointment.   Remove dressing in 48 hours      Call MD for:  temperature >100.4      Call MD for:  persistant nausea and vomiting      Call MD for:  severe uncontrolled pain      Call MD for:  redness, tenderness, or signs of infection (pain, swelling, redness, odor or green/yellow discharge around incision site)      Call MD for:  difficulty breathing, headache or visual disturbances      Call MD for:  hives      Call MD for:  persistant dizziness or light-headedness      Call MD for:  extreme fatigue        Medication List  As of 01/23/2012  7:05 PM   TAKE these medications         albuterol-ipratropium 18-103 MCG/ACT inhaler   Commonly known as: COMBIVENT   Inhale 2 puffs into the lungs every 6 (six) hours as needed. For shortness of breath      aspirin 81 MG tablet   Take 81 mg by mouth daily.      carvedilol 3.125 MG tablet   Commonly known as: COREG   Take 3.125 mg by mouth 2 (two) times daily with a meal.      esomeprazole 40 MG capsule   Commonly known as: NEXIUM   Take 40 mg by mouth as needed. For acid reflux      fentaNYL 75 MCG/HR   Commonly known as: DURAGESIC - dosed mcg/hr   Place 1 patch onto the skin every other day.      Fluticasone-Salmeterol 250-50 MCG/DOSE Aepb   Commonly known as: ADVAIR   Inhale 1 puff into the lungs 2 (two) times daily as needed.      furosemide 20 MG tablet   Commonly known as: LASIX   Take 20 mg by mouth 2 (two) times daily.      isosorbide mononitrate 30 MG 24 hr tablet   Commonly known as: IMDUR   Take 30 mg by mouth daily.      lisinopril 5 MG tablet   Commonly known as: PRINIVIL,ZESTRIL   Take 2.5 mg by mouth daily.      simvastatin 20 MG tablet   Commonly known as: ZOCOR   Take 20 mg by mouth at  bedtime.             SignedNewman Pies D 01/23/2012, 7:05 PM

## 2012-01-23 NOTE — H&P (Signed)
Subjective: She is a 56 year old white female who has symptoms consistent with left ulnar neuropathy. This has been confirmed with NCV EMGs. I discussed the various treatment options with the patient including surgery. The patient has weighed the risks, benefits, and alternatives to surgery like to proceed we'll with a left ulnar nerve neurolysis at the elbow .  Past Medical History  Diagnosis Date  . NICM (nonischemic cardiomyopathy)   . HLD (hyperlipidemia)   . Chronic back pain   . PVCs (premature ventricular contractions)   . Heart failure   . Left ventricular systolic dysfunction   . CHF (congestive heart failure)   . ICD (implantable cardiac defibrillator) in place 2012  . GERD (gastroesophageal reflux disease)   . Arthritis     Past Surgical History  Procedure Date  . Neck surgery     fused  . Carpal tunnel release   . Hand surgery   . Appendectomy   . Tonsillectomy   . Cesarean section   . Cholecystectomy   . Intestinal blockage 2011     No Known Allergies  History  Substance Use Topics  . Smoking status: Former Smoker -- 0.5 packs/day for 20 years    Types: Cigarettes    Quit date: 08/11/2010  . Smokeless tobacco: Never Used  . Alcohol Use: No    Family History  Problem Relation Age of Onset  . Heart attack    . Cancer    . Heart failure    . Anesthesia problems Neg Hx   . Hypotension Neg Hx   . Malignant hyperthermia Neg Hx   . Pseudochol deficiency Neg Hx    Prior to Admission medications   Medication Sig Start Date End Date Taking? Authorizing Provider  albuterol-ipratropium (COMBIVENT) 18-103 MCG/ACT inhaler Inhale 2 puffs into the lungs every 6 (six) hours as needed. For shortness of breath   Yes Historical Provider, MD  aspirin 81 MG tablet Take 81 mg by mouth daily.     Yes Historical Provider, MD  carvedilol (COREG) 3.125 MG tablet Take 3.125 mg by mouth 2 (two) times daily with a meal.   Yes Historical Provider, MD  esomeprazole (NEXIUM) 40 MG  capsule Take 40 mg by mouth as needed. For acid reflux   Yes Historical Provider, MD  fentaNYL (DURAGESIC - DOSED MCG/HR) 75 MCG/HR Place 1 patch onto the skin every other day.    Yes Historical Provider, MD  Fluticasone-Salmeterol (ADVAIR) 250-50 MCG/DOSE AEPB Inhale 1 puff into the lungs 2 (two) times daily as needed.    Yes Historical Provider, MD  furosemide (LASIX) 20 MG tablet Take 20 mg by mouth 2 (two) times daily.    Yes Historical Provider, MD  isosorbide mononitrate (IMDUR) 30 MG 24 hr tablet Take 30 mg by mouth daily.   Yes Historical Provider, MD  lisinopril (PRINIVIL,ZESTRIL) 5 MG tablet Take 2.5 mg by mouth daily.    Yes Historical Provider, MD  simvastatin (ZOCOR) 20 MG tablet Take 20 mg by mouth at bedtime.     Yes Historical Provider, MD     Review of Systems  Positive ROS: As above  All other systems have been reviewed and were otherwise negative with the exception of those mentioned in the HPI and as above.  Objective: Vital signs in last 24 hours: Temp:  [98.5 F (36.9 C)] 98.5 F (36.9 C) (05/30 1214) Pulse Rate:  [76] 76  (05/30 1214) Resp:  [18] 18  (05/30 1214) BP: (111)/(71) 111/71 mmHg (05/30 1214)  SpO2:  [95 %] 95 % (05/30 1214)  General Appearance: Alert, cooperative, no distress, appears stated age Head: Normocephalic, without obvious abnormality, atraumatic Eyes: PERRL, conjunctiva/corneas clear, EOM's intact, fundi benign, both eyes      Ears: Normal TM's and external ear canals, both ears Throat: Lips, mucosa, and tongue normal; teeth and gums normal Neck: Supple, symmetrical, trachea midline, no adenopathy; thyroid: No enlargement/tenderness/nodules; no carotid bruit or JVD Back: Symmetric, no curvature, ROM normal, no CVA tenderness Lungs: Clear to auscultation bilaterally, respirations unlabored Heart: Regular rate and rhythm, S1 and S2 normal, no murmur, rub or gallop Abdomen: Soft, non-tender, bowel sounds active all four quadrants, no masses,  no organomegaly Extremities: Extremities normal, atraumatic, no cyanosis or edema the patient has multiple well healed incisions on her left upper extremity. Pulses: 2+ and symmetric all extremities Skin: Skin color, texture, turgor normal, no rashes or lesions  NEUROLOGIC:   Mental status: alert and oriented, no aphasia, good attention span, Fund of knowledge/ memory ok Motor Exam - grossly normal Sensory Exam - grossly normal Reflexes:  Coordination - grossly normal Gait - grossly normal Balance - grossly normal Cranial Nerves: I: smell Not tested  II: visual acuity  OS: Normal    OD: Normal   II: visual fields Full to confrontation  II: pupils Equal, round, reactive to light  III,VII: ptosis None  III,IV,VI: extraocular muscles  Full ROM  V: mastication Normal  V: facial light touch sensation  Normal  V,VII: corneal reflex  Present  VII: facial muscle function - upper  Normal  VII: facial muscle function - lower Normal  VIII: hearing Not tested  IX: soft palate elevation  Normal  IX,X: gag reflex Present  XI: trapezius strength  5/5  XI: sternocleidomastoid strength 5/5  XI: neck flexion strength  5/5  XII: tongue strength  Normal    Data Review Lab Results  Component Value Date   WBC 14.5* 01/10/2012   HGB 12.5 01/10/2012   HCT 37.0 01/10/2012   MCV 87.5 01/10/2012   PLT 317 01/10/2012   Lab Results  Component Value Date   NA 140 01/10/2012   K 3.8 01/10/2012   CL 100 01/10/2012   CO2 29 01/10/2012   BUN 11 01/10/2012   CREATININE 0.60 01/10/2012   GLUCOSE 92 01/10/2012   No results found for this basename: INR, PROTIME    Assessment/Plan: Recurrent left ulnar neuropathy: I discussed the situation with the patient. We have discussed the various treatment options including a left ulnar nerve neurolysis at the elbow. I described the surgery to her. I discussed the risks, benefits, alternatives and likelihood of achieving our goals with surgery. I've answered all the  patient's questions. She wants to proceed we'll left ulnar normalizes at the elbow.  Cardiac history: Noted   Ophelia Charter 01/23/2012 5:57 PM

## 2012-01-24 ENCOUNTER — Encounter (HOSPITAL_COMMUNITY): Payer: Self-pay | Admitting: Neurosurgery

## 2012-02-04 DIAGNOSIS — M25569 Pain in unspecified knee: Secondary | ICD-10-CM | POA: Diagnosis not present

## 2012-02-21 DIAGNOSIS — K5730 Diverticulosis of large intestine without perforation or abscess without bleeding: Secondary | ICD-10-CM | POA: Diagnosis not present

## 2012-02-21 DIAGNOSIS — K625 Hemorrhage of anus and rectum: Secondary | ICD-10-CM | POA: Diagnosis not present

## 2012-02-21 DIAGNOSIS — Z1211 Encounter for screening for malignant neoplasm of colon: Secondary | ICD-10-CM | POA: Diagnosis not present

## 2012-02-21 DIAGNOSIS — R198 Other specified symptoms and signs involving the digestive system and abdomen: Secondary | ICD-10-CM | POA: Diagnosis not present

## 2012-02-21 DIAGNOSIS — K648 Other hemorrhoids: Secondary | ICD-10-CM | POA: Diagnosis not present

## 2012-02-21 DIAGNOSIS — K5900 Constipation, unspecified: Secondary | ICD-10-CM | POA: Diagnosis not present

## 2012-03-02 DIAGNOSIS — S5400XA Injury of ulnar nerve at forearm level, unspecified arm, initial encounter: Secondary | ICD-10-CM | POA: Diagnosis not present

## 2012-03-19 DIAGNOSIS — M654 Radial styloid tenosynovitis [de Quervain]: Secondary | ICD-10-CM | POA: Diagnosis not present

## 2012-04-06 DIAGNOSIS — J449 Chronic obstructive pulmonary disease, unspecified: Secondary | ICD-10-CM | POA: Diagnosis not present

## 2012-04-06 DIAGNOSIS — E785 Hyperlipidemia, unspecified: Secondary | ICD-10-CM | POA: Diagnosis not present

## 2012-04-06 DIAGNOSIS — I509 Heart failure, unspecified: Secondary | ICD-10-CM | POA: Diagnosis not present

## 2012-04-06 DIAGNOSIS — J45901 Unspecified asthma with (acute) exacerbation: Secondary | ICD-10-CM | POA: Diagnosis not present

## 2012-04-24 DIAGNOSIS — M654 Radial styloid tenosynovitis [de Quervain]: Secondary | ICD-10-CM | POA: Diagnosis not present

## 2012-05-07 DIAGNOSIS — M25569 Pain in unspecified knee: Secondary | ICD-10-CM | POA: Diagnosis not present

## 2012-05-07 DIAGNOSIS — G894 Chronic pain syndrome: Secondary | ICD-10-CM | POA: Diagnosis not present

## 2012-05-25 DIAGNOSIS — M654 Radial styloid tenosynovitis [de Quervain]: Secondary | ICD-10-CM | POA: Diagnosis not present

## 2012-06-01 DIAGNOSIS — Z95810 Presence of automatic (implantable) cardiac defibrillator: Secondary | ICD-10-CM | POA: Diagnosis not present

## 2012-06-01 DIAGNOSIS — R002 Palpitations: Secondary | ICD-10-CM | POA: Diagnosis not present

## 2012-06-01 DIAGNOSIS — I472 Ventricular tachycardia: Secondary | ICD-10-CM | POA: Diagnosis not present

## 2012-06-01 DIAGNOSIS — I509 Heart failure, unspecified: Secondary | ICD-10-CM | POA: Diagnosis not present

## 2012-06-01 DIAGNOSIS — I4949 Other premature depolarization: Secondary | ICD-10-CM | POA: Diagnosis not present

## 2012-06-15 DIAGNOSIS — M654 Radial styloid tenosynovitis [de Quervain]: Secondary | ICD-10-CM | POA: Diagnosis not present

## 2012-06-15 DIAGNOSIS — G5620 Lesion of ulnar nerve, unspecified upper limb: Secondary | ICD-10-CM | POA: Diagnosis not present

## 2012-07-08 DIAGNOSIS — E785 Hyperlipidemia, unspecified: Secondary | ICD-10-CM | POA: Diagnosis not present

## 2012-07-08 DIAGNOSIS — Z95810 Presence of automatic (implantable) cardiac defibrillator: Secondary | ICD-10-CM | POA: Diagnosis not present

## 2012-07-08 DIAGNOSIS — I4949 Other premature depolarization: Secondary | ICD-10-CM | POA: Diagnosis not present

## 2012-07-08 DIAGNOSIS — R002 Palpitations: Secondary | ICD-10-CM | POA: Diagnosis not present

## 2012-07-08 DIAGNOSIS — R0789 Other chest pain: Secondary | ICD-10-CM | POA: Diagnosis not present

## 2012-07-08 DIAGNOSIS — I509 Heart failure, unspecified: Secondary | ICD-10-CM | POA: Diagnosis not present

## 2012-07-09 DIAGNOSIS — I509 Heart failure, unspecified: Secondary | ICD-10-CM | POA: Diagnosis not present

## 2012-07-09 DIAGNOSIS — Z23 Encounter for immunization: Secondary | ICD-10-CM | POA: Diagnosis not present

## 2012-07-09 DIAGNOSIS — E785 Hyperlipidemia, unspecified: Secondary | ICD-10-CM | POA: Diagnosis not present

## 2012-07-09 DIAGNOSIS — J449 Chronic obstructive pulmonary disease, unspecified: Secondary | ICD-10-CM | POA: Diagnosis not present

## 2012-07-09 DIAGNOSIS — J45901 Unspecified asthma with (acute) exacerbation: Secondary | ICD-10-CM | POA: Diagnosis not present

## 2012-07-21 DIAGNOSIS — M542 Cervicalgia: Secondary | ICD-10-CM | POA: Diagnosis not present

## 2012-07-31 ENCOUNTER — Other Ambulatory Visit: Payer: Self-pay | Admitting: Neurosurgery

## 2012-07-31 DIAGNOSIS — M542 Cervicalgia: Secondary | ICD-10-CM

## 2012-08-10 ENCOUNTER — Ambulatory Visit
Admission: RE | Admit: 2012-08-10 | Discharge: 2012-08-10 | Disposition: A | Discharge status: Home / Self Care | Payer: Medicare Other | Source: Ambulatory Visit | Attending: Neurosurgery | Admitting: Neurosurgery

## 2012-08-10 VITALS — BP 82/51 | HR 68

## 2012-08-10 DIAGNOSIS — M542 Cervicalgia: Secondary | ICD-10-CM

## 2012-08-10 MED ORDER — ONDANSETRON HCL 4 MG/2ML IJ SOLN
4.0000 mg | Freq: Once | INTRAMUSCULAR | Status: AC
  Administered 2012-08-10: 4 mg via INTRAMUSCULAR

## 2012-08-10 MED ORDER — IOHEXOL 300 MG/ML  SOLN
10.0000 mL | Freq: Once | INTRAMUSCULAR | Status: AC | PRN
  Administered 2012-08-10: 10 mL via INTRATHECAL

## 2012-08-10 MED ORDER — DIAZEPAM 5 MG PO TABS
10.0000 mg | ORAL_TABLET | Freq: Once | ORAL | Status: AC
  Administered 2012-08-10: 10 mg via ORAL

## 2012-08-10 MED ORDER — ONDANSETRON HCL 4 MG/2ML IJ SOLN: 4.0000 mg | Freq: Four times a day (QID) | INTRAMUSCULAR | Status: DC | PRN

## 2012-08-10 MED ORDER — HYDROMORPHONE HCL PF 2 MG/ML IJ SOLN
2.0000 mg | Freq: Once | INTRAMUSCULAR | Status: AC
  Administered 2012-08-10: 2 mg via INTRAMUSCULAR

## 2012-08-12 ENCOUNTER — Other Ambulatory Visit: Payer: Self-pay | Admitting: Neurosurgery

## 2012-08-12 DIAGNOSIS — Z79899 Other long term (current) drug therapy: Secondary | ICD-10-CM | POA: Diagnosis not present

## 2012-08-12 DIAGNOSIS — M25569 Pain in unspecified knee: Secondary | ICD-10-CM | POA: Diagnosis not present

## 2012-08-12 DIAGNOSIS — G894 Chronic pain syndrome: Secondary | ICD-10-CM | POA: Diagnosis not present

## 2012-08-13 DIAGNOSIS — E785 Hyperlipidemia, unspecified: Secondary | ICD-10-CM | POA: Diagnosis not present

## 2012-08-13 DIAGNOSIS — I509 Heart failure, unspecified: Secondary | ICD-10-CM | POA: Diagnosis not present

## 2012-08-13 DIAGNOSIS — I4949 Other premature depolarization: Secondary | ICD-10-CM | POA: Diagnosis not present

## 2012-08-13 DIAGNOSIS — G8928 Other chronic postprocedural pain: Secondary | ICD-10-CM | POA: Diagnosis not present

## 2012-08-13 DIAGNOSIS — Z95810 Presence of automatic (implantable) cardiac defibrillator: Secondary | ICD-10-CM | POA: Diagnosis not present

## 2012-08-13 DIAGNOSIS — R002 Palpitations: Secondary | ICD-10-CM | POA: Diagnosis not present

## 2012-08-21 DIAGNOSIS — M542 Cervicalgia: Secondary | ICD-10-CM | POA: Diagnosis not present

## 2012-09-02 DIAGNOSIS — J45901 Unspecified asthma with (acute) exacerbation: Secondary | ICD-10-CM | POA: Diagnosis not present

## 2012-09-02 DIAGNOSIS — Z23 Encounter for immunization: Secondary | ICD-10-CM | POA: Diagnosis not present

## 2012-11-17 DIAGNOSIS — Z79899 Other long term (current) drug therapy: Secondary | ICD-10-CM | POA: Diagnosis not present

## 2012-11-17 DIAGNOSIS — G894 Chronic pain syndrome: Secondary | ICD-10-CM | POA: Diagnosis not present

## 2012-11-21 DIAGNOSIS — N3000 Acute cystitis without hematuria: Secondary | ICD-10-CM | POA: Diagnosis not present

## 2012-11-21 DIAGNOSIS — R58 Hemorrhage, not elsewhere classified: Secondary | ICD-10-CM | POA: Diagnosis not present

## 2012-12-01 DIAGNOSIS — N390 Urinary tract infection, site not specified: Secondary | ICD-10-CM | POA: Diagnosis not present

## 2012-12-03 DIAGNOSIS — I472 Ventricular tachycardia: Secondary | ICD-10-CM | POA: Diagnosis not present

## 2012-12-03 DIAGNOSIS — Z95810 Presence of automatic (implantable) cardiac defibrillator: Secondary | ICD-10-CM | POA: Diagnosis not present

## 2012-12-16 DIAGNOSIS — I509 Heart failure, unspecified: Secondary | ICD-10-CM | POA: Diagnosis not present

## 2012-12-16 DIAGNOSIS — I4949 Other premature depolarization: Secondary | ICD-10-CM | POA: Diagnosis not present

## 2012-12-16 DIAGNOSIS — Z95810 Presence of automatic (implantable) cardiac defibrillator: Secondary | ICD-10-CM | POA: Diagnosis not present

## 2012-12-16 DIAGNOSIS — R002 Palpitations: Secondary | ICD-10-CM | POA: Diagnosis not present

## 2012-12-16 DIAGNOSIS — G8928 Other chronic postprocedural pain: Secondary | ICD-10-CM | POA: Diagnosis not present

## 2012-12-16 DIAGNOSIS — E785 Hyperlipidemia, unspecified: Secondary | ICD-10-CM | POA: Diagnosis not present

## 2012-12-29 DIAGNOSIS — J45901 Unspecified asthma with (acute) exacerbation: Secondary | ICD-10-CM | POA: Diagnosis not present

## 2013-01-05 DIAGNOSIS — J45901 Unspecified asthma with (acute) exacerbation: Secondary | ICD-10-CM | POA: Diagnosis not present

## 2013-01-13 DIAGNOSIS — I509 Heart failure, unspecified: Secondary | ICD-10-CM | POA: Diagnosis not present

## 2013-01-13 DIAGNOSIS — Z95810 Presence of automatic (implantable) cardiac defibrillator: Secondary | ICD-10-CM | POA: Diagnosis not present

## 2013-01-13 DIAGNOSIS — E785 Hyperlipidemia, unspecified: Secondary | ICD-10-CM | POA: Diagnosis not present

## 2013-02-09 DIAGNOSIS — J449 Chronic obstructive pulmonary disease, unspecified: Secondary | ICD-10-CM | POA: Diagnosis not present

## 2013-02-10 DIAGNOSIS — J441 Chronic obstructive pulmonary disease with (acute) exacerbation: Secondary | ICD-10-CM | POA: Diagnosis not present

## 2013-02-15 DIAGNOSIS — Z79899 Other long term (current) drug therapy: Secondary | ICD-10-CM | POA: Diagnosis not present

## 2013-02-15 DIAGNOSIS — M25569 Pain in unspecified knee: Secondary | ICD-10-CM | POA: Diagnosis not present

## 2013-02-15 DIAGNOSIS — G894 Chronic pain syndrome: Secondary | ICD-10-CM | POA: Diagnosis not present

## 2013-02-16 DIAGNOSIS — Z79899 Other long term (current) drug therapy: Secondary | ICD-10-CM | POA: Diagnosis not present

## 2013-02-16 DIAGNOSIS — Z5181 Encounter for therapeutic drug level monitoring: Secondary | ICD-10-CM | POA: Diagnosis not present

## 2013-02-24 DIAGNOSIS — M76899 Other specified enthesopathies of unspecified lower limb, excluding foot: Secondary | ICD-10-CM | POA: Diagnosis not present

## 2013-03-03 DIAGNOSIS — M76899 Other specified enthesopathies of unspecified lower limb, excluding foot: Secondary | ICD-10-CM | POA: Diagnosis not present

## 2013-03-09 DIAGNOSIS — I498 Other specified cardiac arrhythmias: Secondary | ICD-10-CM | POA: Diagnosis not present

## 2013-03-12 IMAGING — CT CT CERVICAL SPINE W/ CM
4 of 5 series · 14 of 33 positions shown, 16 images · non-contrast
Comparison: none

CLINICAL DATA: Cervical spine pain.  Neck pain.  Prior cervical
fusion.  Neck pain radiating into both shoulders.  Numbness of the
hands.
TECHNIQUE: Contiguous axial images were obtained through the
Cervical spine without infusion. Coronal and sagittal
reconstructions were obtained of the axial image sets.

[Series 2: c spine bone · axial · 0.23mm/px · z∈[-134,-64]mm · 3 of 57 slices shown, 4 images]
[im 15/57  soft-tissue]
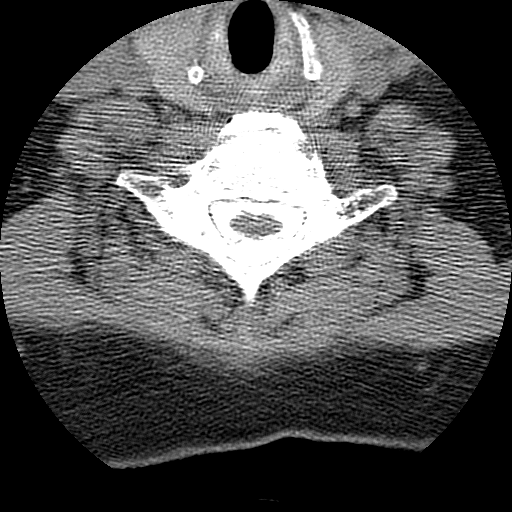
[im 15/57  bone]
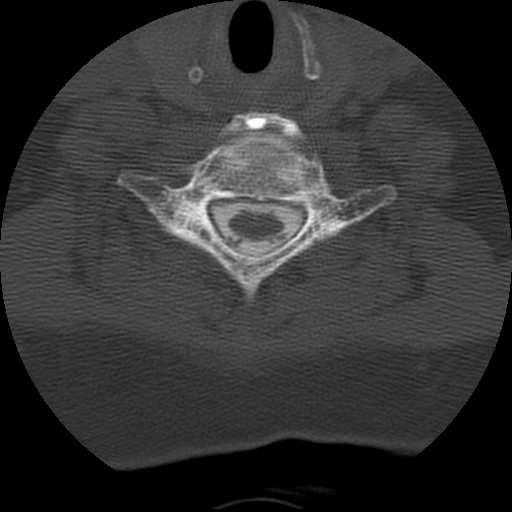
[im 29/57  bone]
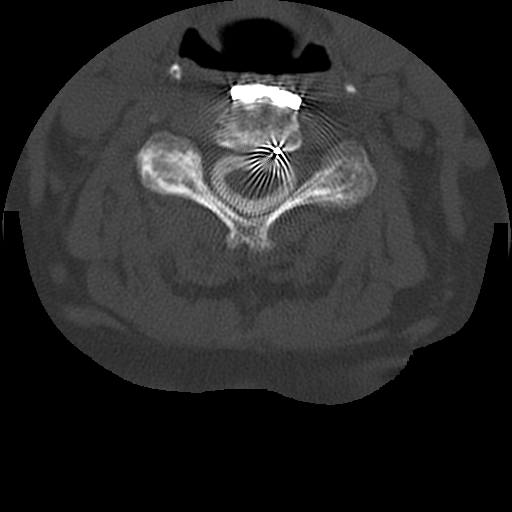
[im 43/57  bone]
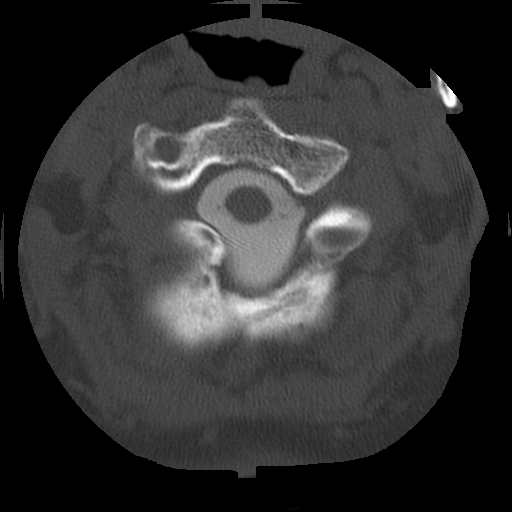

[Series 400: cor · coronal · 0.28mm/px · 3 of 36 slices shown]
[im 8/36  bone]
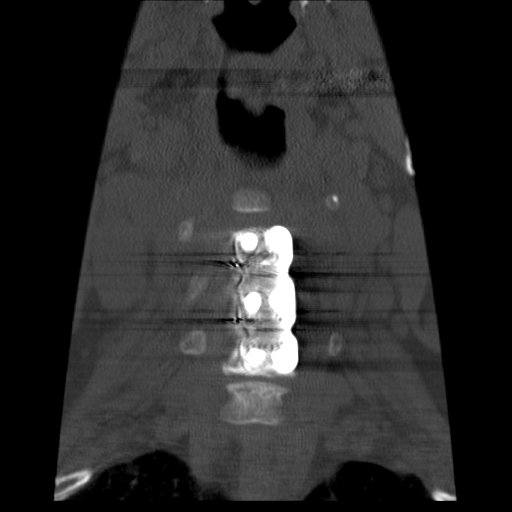
[im 15/36  bone]
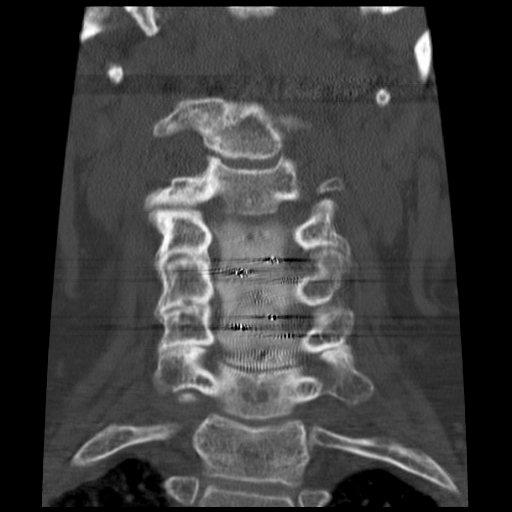
[im 22/36  bone]
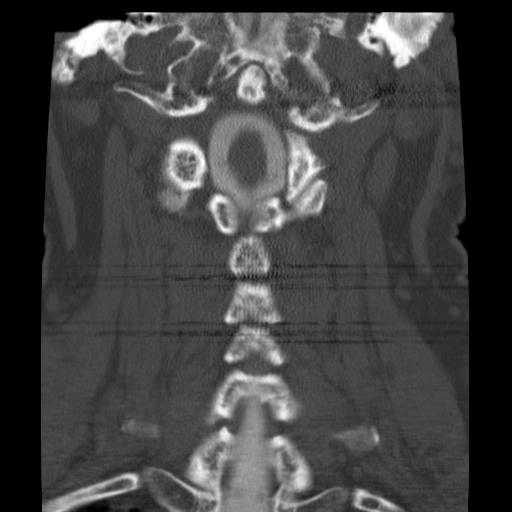

[Series 401: sag · sagittal · 0.28mm/px · 5 of 38 slices shown, 6 images]
[im 13/38  bone]
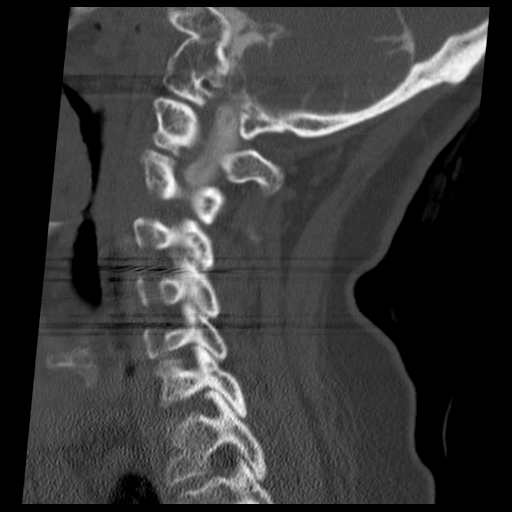
[im 16/38  bone]
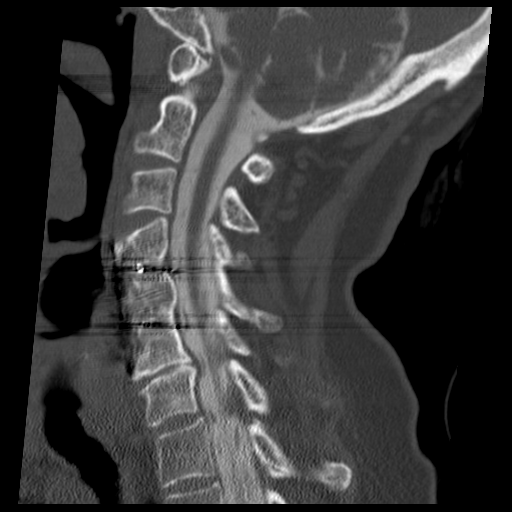
[im 19/38  soft-tissue]
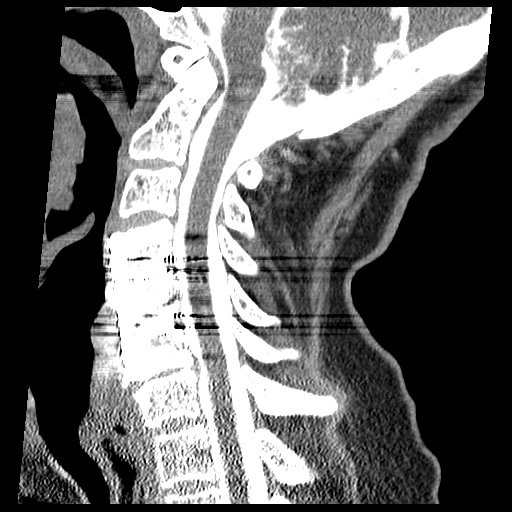
[im 19/38  bone]
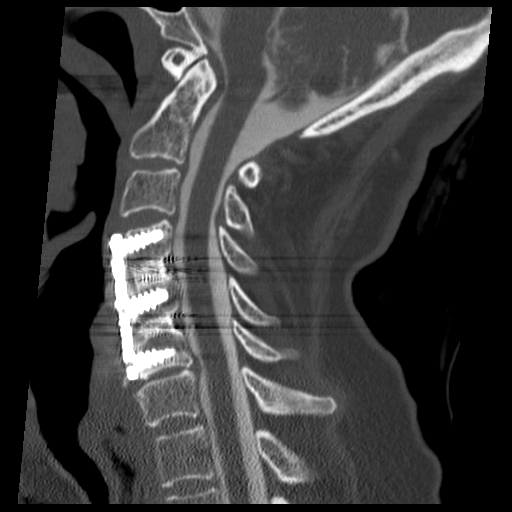
[im 22/38  bone]
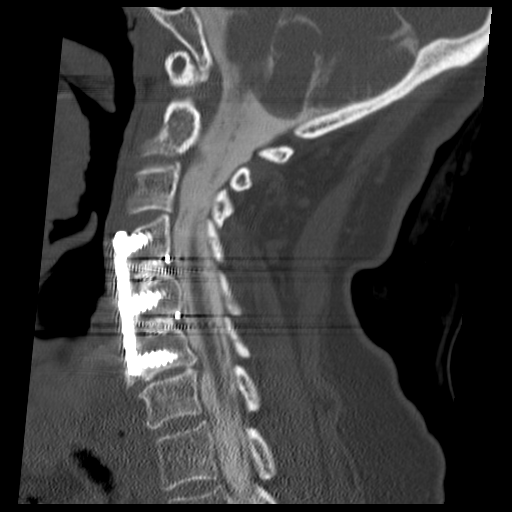
[im 25/38  bone]
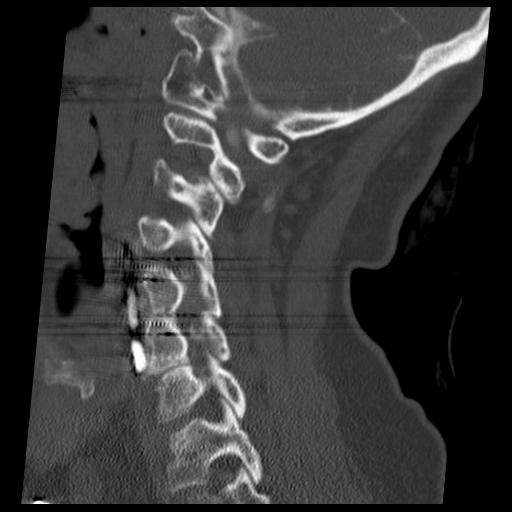

[Series 402: axial · axial · 0.23mm/px · z∈[-143,-73]mm · 3 of 72 slices shown]
[im 18/72  bone]
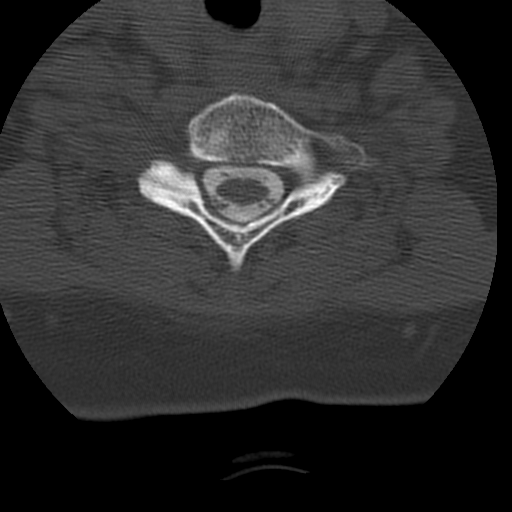
[im 36/72  bone]
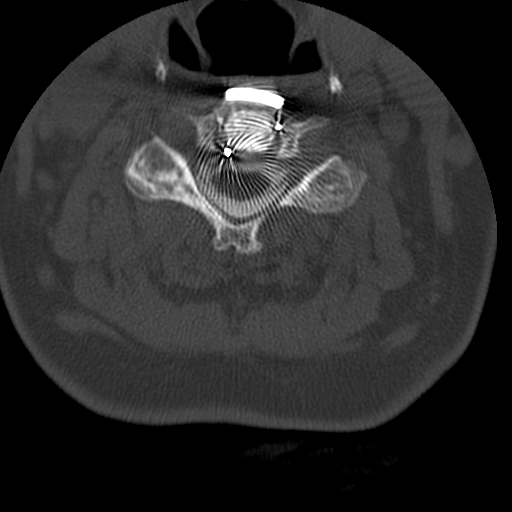
[im 54/72  bone]
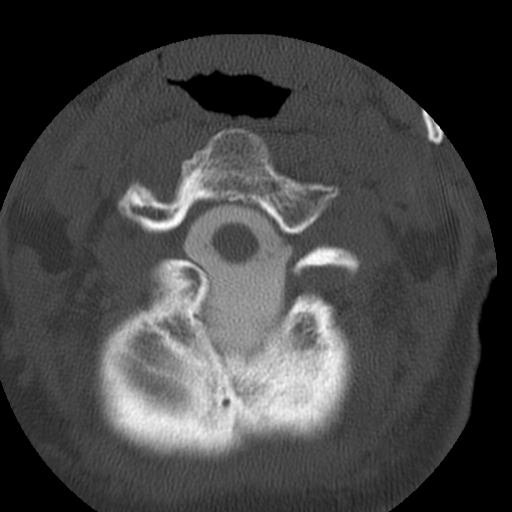

[14 of 33 positions shown; findings below may reference images not displayed]

LUMBAR PUNCTURE FOR CERVICAL MYELOGRAM

 Procedure: After thorough discussion of risks and benefits of the
procedure including bleeding, infection, injury to nerves, blood
vessels, adjacent structures as well as headache and CSF leak,
written and oral informed consent was obtained.   Consent was
obtained by Dr. Wisner. We discussed the high likelihood of
obtaining a diagnostic study.

Patient was positioned prone on the fluoroscopy table. Local
anesthesia was provided with 1% lidocaine without epinephrine after
prepped and draped in the usual sterile fashion. Puncture was
performed at L3-L4 using a 3-1/2 inches pencil point 22-gauge
spinal needle via right paramedian approach.  Using a single pass
through the dura, the needle was placed within the thecal sac, with
return of clear CSF. 8 mL of [id number] was injected into the
thecal sac, with normal opacification of the nerve roots and cauda
equina consistent with free flow within the subarachnoid space.

Fluoroscopy time: 43 seconds

I personally performed the lumbar puncture and administered the
intrathecal contrast. I also personally supervised acquisition of
the myelogram images.
FINDINGS: C4-C6 ACDF is present.  There is adjacent segment
disease at C6-V7 with narrowing of the ventral subarachnoid space.
This appears due to a disc osteophyte complex.  This effaces the
ventral subarachnoid space and probably contact the cervical cord.
Retrolisthesis of C3 and C4 is present measuring 3 mm associated
with extension at the time of prone myelographic imaging.  Central
stenosis at C3-C4 appears very mild, with shallow anterior
impression and no convincing cord deformity.
IMPRESSION: 1.  Technically successful with L3-L4 lumbar puncture with pencil
point needle.
2.  C4-C6 ACDF.  No complicating features.  Fusion appears solid.
3.  C6-C7 adjacent segment disease with at least mild central
stenosis.
4.  3 mm retrolisthesis of C3 on C4 with mild central stenosis.

CT CERVICAL MYELOGRAM
FINDINGS: Scout images demonstrate dual lead left subclavian
cardiac pacemaker, precluding MRI.  There is very mild levoconvex
torticollis with the apex at C5-C6.

C4-C6 solid fusion is present with ACDF plate.  Cervical cord
demonstrates no swelling.  Paraspinal soft tissues appear within
normal limits.  There is partial ankylosis of the C4-C6 facet
joints on the right.  Left C4-C5 facet ankylosis is present.

There is ankylosis of the atlas to the skull base with cranial
settling.

C2-C3:  Right facet arthrosis.  The foramina and central canal
appear adequately patent. Tiny central disc protrusion without mass
effect.

C3-C4:  The alignment is anatomic in the supine position.  Mild
bilateral facet arthrosis.  The central canal appears adequately
patent.  Shallow disc bulging.  Disc height is preserved.  Minimal
right foraminal encroachment associated with uncovertebral spurring
and mild facet arthrosis which could irritate the right C4 nerve.
The left neural foramen appears patent.

C4-C5:  ACDF.  No recurrent stenosis.

C5-C6:  ACDF.  No recurrent stenosis.

C6-C7 adjacent segment disease with mild central stenosis.  Broad-
based disc bulging with endplate osteophyte just contacts the
ventral aspect of the cervical cord without cord deformity.  Mild
right greater than left foraminal encroachment associated with
uncovertebral spurring.  Mild right facet arthrosis.

C7-T1:  Normal disc.  Central canal and foramina appear patent.
IMPRESSION: 1. Atlanto-occipital ankylosis and cranial settling.  May be
congenital or associated with prior inflammatory arthropathy or
trauma.  The fusion of the skull base to the atlas is solid.
2.  3 mm retrolisthesis with extension on myelogram reduces to
anatomic position when the patient is positioned supine for the CT
scan.  Minimal right foraminal encroachment could irritate the
exiting right C4 nerve.
3.  Solid fusion from C4-C6.
4.  C6-C7 adjacent segment disease with mild central stenosis.
Mild right greater than left bilateral foraminal encroachment
potentially affecting the C7 nerves.

## 2013-03-15 DIAGNOSIS — G4733 Obstructive sleep apnea (adult) (pediatric): Secondary | ICD-10-CM | POA: Diagnosis not present

## 2013-03-23 DIAGNOSIS — I509 Heart failure, unspecified: Secondary | ICD-10-CM | POA: Diagnosis not present

## 2013-03-23 DIAGNOSIS — J449 Chronic obstructive pulmonary disease, unspecified: Secondary | ICD-10-CM | POA: Diagnosis not present

## 2013-05-03 DIAGNOSIS — J019 Acute sinusitis, unspecified: Secondary | ICD-10-CM | POA: Diagnosis not present

## 2013-05-03 DIAGNOSIS — R05 Cough: Secondary | ICD-10-CM | POA: Diagnosis not present

## 2013-05-03 DIAGNOSIS — J449 Chronic obstructive pulmonary disease, unspecified: Secondary | ICD-10-CM | POA: Diagnosis not present

## 2013-05-03 DIAGNOSIS — R059 Cough, unspecified: Secondary | ICD-10-CM | POA: Diagnosis not present

## 2013-05-03 DIAGNOSIS — J40 Bronchitis, not specified as acute or chronic: Secondary | ICD-10-CM | POA: Diagnosis not present

## 2013-05-21 DIAGNOSIS — Z5181 Encounter for therapeutic drug level monitoring: Secondary | ICD-10-CM | POA: Diagnosis not present

## 2013-05-21 DIAGNOSIS — G894 Chronic pain syndrome: Secondary | ICD-10-CM | POA: Diagnosis not present

## 2013-05-21 DIAGNOSIS — Z79899 Other long term (current) drug therapy: Secondary | ICD-10-CM | POA: Diagnosis not present

## 2013-05-21 DIAGNOSIS — M25569 Pain in unspecified knee: Secondary | ICD-10-CM | POA: Diagnosis not present

## 2013-06-02 DIAGNOSIS — Z5181 Encounter for therapeutic drug level monitoring: Secondary | ICD-10-CM | POA: Diagnosis not present

## 2013-06-02 DIAGNOSIS — Z79899 Other long term (current) drug therapy: Secondary | ICD-10-CM | POA: Diagnosis not present

## 2013-06-09 DIAGNOSIS — J209 Acute bronchitis, unspecified: Secondary | ICD-10-CM | POA: Diagnosis not present

## 2013-06-09 DIAGNOSIS — J0100 Acute maxillary sinusitis, unspecified: Secondary | ICD-10-CM | POA: Diagnosis not present

## 2013-06-10 DIAGNOSIS — I498 Other specified cardiac arrhythmias: Secondary | ICD-10-CM | POA: Diagnosis not present

## 2013-07-02 DIAGNOSIS — M169 Osteoarthritis of hip, unspecified: Secondary | ICD-10-CM | POA: Diagnosis not present

## 2013-07-02 DIAGNOSIS — I428 Other cardiomyopathies: Secondary | ICD-10-CM | POA: Diagnosis not present

## 2013-07-02 DIAGNOSIS — Z23 Encounter for immunization: Secondary | ICD-10-CM | POA: Diagnosis not present

## 2013-07-02 DIAGNOSIS — I509 Heart failure, unspecified: Secondary | ICD-10-CM | POA: Diagnosis not present

## 2013-07-02 DIAGNOSIS — E785 Hyperlipidemia, unspecified: Secondary | ICD-10-CM | POA: Diagnosis not present

## 2013-07-05 DIAGNOSIS — M76899 Other specified enthesopathies of unspecified lower limb, excluding foot: Secondary | ICD-10-CM | POA: Diagnosis not present

## 2013-07-05 DIAGNOSIS — M1610 Unilateral primary osteoarthritis, unspecified hip: Secondary | ICD-10-CM | POA: Diagnosis not present

## 2013-07-05 DIAGNOSIS — M25559 Pain in unspecified hip: Secondary | ICD-10-CM | POA: Diagnosis not present

## 2013-08-02 DIAGNOSIS — M25559 Pain in unspecified hip: Secondary | ICD-10-CM | POA: Diagnosis not present

## 2013-08-02 DIAGNOSIS — M76899 Other specified enthesopathies of unspecified lower limb, excluding foot: Secondary | ICD-10-CM | POA: Diagnosis not present

## 2013-08-02 DIAGNOSIS — M1610 Unilateral primary osteoarthritis, unspecified hip: Secondary | ICD-10-CM | POA: Diagnosis not present

## 2013-08-05 DIAGNOSIS — M25559 Pain in unspecified hip: Secondary | ICD-10-CM | POA: Diagnosis not present

## 2013-08-05 DIAGNOSIS — E785 Hyperlipidemia, unspecified: Secondary | ICD-10-CM | POA: Diagnosis not present

## 2013-08-05 DIAGNOSIS — R002 Palpitations: Secondary | ICD-10-CM | POA: Diagnosis not present

## 2013-08-05 DIAGNOSIS — M1610 Unilateral primary osteoarthritis, unspecified hip: Secondary | ICD-10-CM | POA: Diagnosis not present

## 2013-08-05 DIAGNOSIS — I472 Ventricular tachycardia: Secondary | ICD-10-CM | POA: Diagnosis not present

## 2013-08-05 DIAGNOSIS — I509 Heart failure, unspecified: Secondary | ICD-10-CM | POA: Diagnosis not present

## 2013-08-05 DIAGNOSIS — Z95810 Presence of automatic (implantable) cardiac defibrillator: Secondary | ICD-10-CM | POA: Diagnosis not present

## 2013-08-05 DIAGNOSIS — I4949 Other premature depolarization: Secondary | ICD-10-CM | POA: Diagnosis not present

## 2013-08-17 DIAGNOSIS — M25569 Pain in unspecified knee: Secondary | ICD-10-CM | POA: Diagnosis not present

## 2013-08-17 DIAGNOSIS — Z79899 Other long term (current) drug therapy: Secondary | ICD-10-CM | POA: Diagnosis not present

## 2013-08-17 DIAGNOSIS — G894 Chronic pain syndrome: Secondary | ICD-10-CM | POA: Diagnosis not present

## 2013-09-09 DIAGNOSIS — B353 Tinea pedis: Secondary | ICD-10-CM | POA: Diagnosis not present

## 2013-09-14 DIAGNOSIS — I498 Other specified cardiac arrhythmias: Secondary | ICD-10-CM | POA: Diagnosis not present

## 2013-09-16 DIAGNOSIS — M76899 Other specified enthesopathies of unspecified lower limb, excluding foot: Secondary | ICD-10-CM | POA: Diagnosis not present

## 2013-09-16 DIAGNOSIS — M1610 Unilateral primary osteoarthritis, unspecified hip: Secondary | ICD-10-CM | POA: Diagnosis not present

## 2013-09-16 DIAGNOSIS — M25559 Pain in unspecified hip: Secondary | ICD-10-CM | POA: Diagnosis not present

## 2013-09-22 DIAGNOSIS — M25559 Pain in unspecified hip: Secondary | ICD-10-CM | POA: Diagnosis not present

## 2013-09-28 DIAGNOSIS — K219 Gastro-esophageal reflux disease without esophagitis: Secondary | ICD-10-CM | POA: Diagnosis not present

## 2013-09-29 DIAGNOSIS — M25559 Pain in unspecified hip: Secondary | ICD-10-CM | POA: Diagnosis not present

## 2013-09-29 DIAGNOSIS — M76899 Other specified enthesopathies of unspecified lower limb, excluding foot: Secondary | ICD-10-CM | POA: Diagnosis not present

## 2013-09-29 DIAGNOSIS — M1610 Unilateral primary osteoarthritis, unspecified hip: Secondary | ICD-10-CM | POA: Diagnosis not present

## 2013-10-07 DIAGNOSIS — IMO0002 Reserved for concepts with insufficient information to code with codable children: Secondary | ICD-10-CM | POA: Diagnosis not present

## 2013-10-07 DIAGNOSIS — M1710 Unilateral primary osteoarthritis, unspecified knee: Secondary | ICD-10-CM | POA: Diagnosis not present

## 2013-10-14 DIAGNOSIS — Z5181 Encounter for therapeutic drug level monitoring: Secondary | ICD-10-CM | POA: Diagnosis not present

## 2013-10-14 DIAGNOSIS — M25569 Pain in unspecified knee: Secondary | ICD-10-CM | POA: Diagnosis not present

## 2013-10-14 DIAGNOSIS — Z79899 Other long term (current) drug therapy: Secondary | ICD-10-CM | POA: Diagnosis not present

## 2013-10-14 DIAGNOSIS — G894 Chronic pain syndrome: Secondary | ICD-10-CM | POA: Diagnosis not present

## 2013-10-19 DIAGNOSIS — M25569 Pain in unspecified knee: Secondary | ICD-10-CM | POA: Diagnosis not present

## 2013-10-19 DIAGNOSIS — M1990 Unspecified osteoarthritis, unspecified site: Secondary | ICD-10-CM | POA: Diagnosis not present

## 2013-11-23 DIAGNOSIS — I10 Essential (primary) hypertension: Secondary | ICD-10-CM | POA: Diagnosis not present

## 2013-11-23 DIAGNOSIS — J449 Chronic obstructive pulmonary disease, unspecified: Secondary | ICD-10-CM | POA: Diagnosis not present

## 2013-11-23 DIAGNOSIS — E785 Hyperlipidemia, unspecified: Secondary | ICD-10-CM | POA: Diagnosis not present

## 2013-12-13 DIAGNOSIS — G894 Chronic pain syndrome: Secondary | ICD-10-CM | POA: Diagnosis not present

## 2013-12-13 DIAGNOSIS — M25569 Pain in unspecified knee: Secondary | ICD-10-CM | POA: Diagnosis not present

## 2013-12-13 DIAGNOSIS — Z79899 Other long term (current) drug therapy: Secondary | ICD-10-CM | POA: Diagnosis not present

## 2013-12-13 DIAGNOSIS — Z5181 Encounter for therapeutic drug level monitoring: Secondary | ICD-10-CM | POA: Diagnosis not present

## 2013-12-16 DIAGNOSIS — I509 Heart failure, unspecified: Secondary | ICD-10-CM | POA: Diagnosis not present

## 2013-12-22 DIAGNOSIS — E039 Hypothyroidism, unspecified: Secondary | ICD-10-CM | POA: Diagnosis not present

## 2013-12-22 DIAGNOSIS — E785 Hyperlipidemia, unspecified: Secondary | ICD-10-CM | POA: Diagnosis not present

## 2013-12-30 DIAGNOSIS — K432 Incisional hernia without obstruction or gangrene: Secondary | ICD-10-CM | POA: Diagnosis not present

## 2013-12-30 DIAGNOSIS — R1033 Periumbilical pain: Secondary | ICD-10-CM | POA: Diagnosis not present

## 2013-12-30 DIAGNOSIS — I2510 Atherosclerotic heart disease of native coronary artery without angina pectoris: Secondary | ICD-10-CM | POA: Diagnosis not present

## 2013-12-30 DIAGNOSIS — R0602 Shortness of breath: Secondary | ICD-10-CM | POA: Diagnosis not present

## 2014-01-04 DIAGNOSIS — R1033 Periumbilical pain: Secondary | ICD-10-CM | POA: Diagnosis not present

## 2014-01-04 DIAGNOSIS — R112 Nausea with vomiting, unspecified: Secondary | ICD-10-CM | POA: Diagnosis not present

## 2014-01-04 DIAGNOSIS — K439 Ventral hernia without obstruction or gangrene: Secondary | ICD-10-CM | POA: Diagnosis not present

## 2014-01-06 DIAGNOSIS — K439 Ventral hernia without obstruction or gangrene: Secondary | ICD-10-CM | POA: Diagnosis not present

## 2014-01-06 DIAGNOSIS — M1990 Unspecified osteoarthritis, unspecified site: Secondary | ICD-10-CM | POA: Diagnosis not present

## 2014-01-06 DIAGNOSIS — J45909 Unspecified asthma, uncomplicated: Secondary | ICD-10-CM | POA: Diagnosis not present

## 2014-01-13 DIAGNOSIS — J449 Chronic obstructive pulmonary disease, unspecified: Secondary | ICD-10-CM | POA: Diagnosis not present

## 2014-01-20 DIAGNOSIS — R1013 Epigastric pain: Secondary | ICD-10-CM | POA: Diagnosis not present

## 2014-01-20 DIAGNOSIS — R112 Nausea with vomiting, unspecified: Secondary | ICD-10-CM | POA: Diagnosis not present

## 2014-02-04 DIAGNOSIS — R112 Nausea with vomiting, unspecified: Secondary | ICD-10-CM | POA: Diagnosis not present

## 2014-02-04 DIAGNOSIS — R109 Unspecified abdominal pain: Secondary | ICD-10-CM | POA: Diagnosis not present

## 2014-02-04 DIAGNOSIS — K319 Disease of stomach and duodenum, unspecified: Secondary | ICD-10-CM | POA: Diagnosis not present

## 2014-02-04 DIAGNOSIS — R1013 Epigastric pain: Secondary | ICD-10-CM | POA: Diagnosis not present

## 2014-02-04 DIAGNOSIS — K2960 Other gastritis without bleeding: Secondary | ICD-10-CM | POA: Diagnosis not present

## 2014-02-04 HISTORY — PX: ESOPHAGOGASTRODUODENOSCOPY: SHX1529

## 2014-02-15 DIAGNOSIS — J449 Chronic obstructive pulmonary disease, unspecified: Secondary | ICD-10-CM | POA: Diagnosis not present

## 2014-02-28 DIAGNOSIS — J019 Acute sinusitis, unspecified: Secondary | ICD-10-CM | POA: Diagnosis not present

## 2014-02-28 DIAGNOSIS — Z6827 Body mass index (BMI) 27.0-27.9, adult: Secondary | ICD-10-CM | POA: Diagnosis not present

## 2014-02-28 DIAGNOSIS — R599 Enlarged lymph nodes, unspecified: Secondary | ICD-10-CM | POA: Diagnosis not present

## 2014-03-02 DIAGNOSIS — G894 Chronic pain syndrome: Secondary | ICD-10-CM | POA: Diagnosis not present

## 2014-03-02 DIAGNOSIS — Z79899 Other long term (current) drug therapy: Secondary | ICD-10-CM | POA: Diagnosis not present

## 2014-03-02 DIAGNOSIS — M5126 Other intervertebral disc displacement, lumbar region: Secondary | ICD-10-CM | POA: Diagnosis not present

## 2014-03-02 DIAGNOSIS — Z5181 Encounter for therapeutic drug level monitoring: Secondary | ICD-10-CM | POA: Diagnosis not present

## 2014-03-02 DIAGNOSIS — M545 Low back pain, unspecified: Secondary | ICD-10-CM | POA: Diagnosis not present

## 2014-03-02 DIAGNOSIS — IMO0002 Reserved for concepts with insufficient information to code with codable children: Secondary | ICD-10-CM | POA: Diagnosis not present

## 2014-03-16 DIAGNOSIS — J019 Acute sinusitis, unspecified: Secondary | ICD-10-CM | POA: Diagnosis not present

## 2014-03-16 DIAGNOSIS — Z6827 Body mass index (BMI) 27.0-27.9, adult: Secondary | ICD-10-CM | POA: Diagnosis not present

## 2014-03-22 DIAGNOSIS — I498 Other specified cardiac arrhythmias: Secondary | ICD-10-CM | POA: Diagnosis not present

## 2014-03-23 DIAGNOSIS — IMO0002 Reserved for concepts with insufficient information to code with codable children: Secondary | ICD-10-CM | POA: Diagnosis not present

## 2014-03-23 DIAGNOSIS — G894 Chronic pain syndrome: Secondary | ICD-10-CM | POA: Diagnosis not present

## 2014-03-23 DIAGNOSIS — Z5181 Encounter for therapeutic drug level monitoring: Secondary | ICD-10-CM | POA: Diagnosis not present

## 2014-03-23 DIAGNOSIS — M5126 Other intervertebral disc displacement, lumbar region: Secondary | ICD-10-CM | POA: Diagnosis not present

## 2014-03-23 DIAGNOSIS — M545 Low back pain, unspecified: Secondary | ICD-10-CM | POA: Diagnosis not present

## 2014-03-23 DIAGNOSIS — Z79899 Other long term (current) drug therapy: Secondary | ICD-10-CM | POA: Diagnosis not present

## 2014-03-30 DIAGNOSIS — Z6827 Body mass index (BMI) 27.0-27.9, adult: Secondary | ICD-10-CM | POA: Diagnosis not present

## 2014-03-30 DIAGNOSIS — E785 Hyperlipidemia, unspecified: Secondary | ICD-10-CM | POA: Diagnosis not present

## 2014-03-30 DIAGNOSIS — E039 Hypothyroidism, unspecified: Secondary | ICD-10-CM | POA: Diagnosis not present

## 2014-03-30 DIAGNOSIS — G8929 Other chronic pain: Secondary | ICD-10-CM | POA: Diagnosis not present

## 2014-03-30 DIAGNOSIS — I5020 Unspecified systolic (congestive) heart failure: Secondary | ICD-10-CM | POA: Diagnosis not present

## 2014-03-30 DIAGNOSIS — I2510 Atherosclerotic heart disease of native coronary artery without angina pectoris: Secondary | ICD-10-CM | POA: Diagnosis not present

## 2014-03-30 DIAGNOSIS — K272 Acute peptic ulcer, site unspecified, with both hemorrhage and perforation: Secondary | ICD-10-CM | POA: Diagnosis not present

## 2014-03-30 DIAGNOSIS — I428 Other cardiomyopathies: Secondary | ICD-10-CM | POA: Diagnosis not present

## 2014-04-12 DIAGNOSIS — S3210XA Unspecified fracture of sacrum, initial encounter for closed fracture: Secondary | ICD-10-CM | POA: Diagnosis not present

## 2014-04-12 DIAGNOSIS — S322XXA Fracture of coccyx, initial encounter for closed fracture: Secondary | ICD-10-CM | POA: Diagnosis not present

## 2014-04-12 DIAGNOSIS — Z6826 Body mass index (BMI) 26.0-26.9, adult: Secondary | ICD-10-CM | POA: Diagnosis not present

## 2014-04-18 ENCOUNTER — Encounter (INDEPENDENT_AMBULATORY_CARE_PROVIDER_SITE_OTHER): Payer: Self-pay | Admitting: Surgery

## 2014-04-18 ENCOUNTER — Ambulatory Visit (INDEPENDENT_AMBULATORY_CARE_PROVIDER_SITE_OTHER): Payer: Medicare Other | Admitting: Surgery

## 2014-04-18 VITALS — BP 124/80 | HR 70 | Temp 98.1°F | Ht 61.0 in | Wt 141.1 lb

## 2014-04-18 DIAGNOSIS — J449 Chronic obstructive pulmonary disease, unspecified: Secondary | ICD-10-CM | POA: Insufficient documentation

## 2014-04-18 DIAGNOSIS — K430 Incisional hernia with obstruction, without gangrene: Secondary | ICD-10-CM | POA: Diagnosis not present

## 2014-04-18 DIAGNOSIS — I428 Other cardiomyopathies: Secondary | ICD-10-CM

## 2014-04-18 DIAGNOSIS — I429 Cardiomyopathy, unspecified: Secondary | ICD-10-CM | POA: Insufficient documentation

## 2014-04-18 DIAGNOSIS — R110 Nausea: Secondary | ICD-10-CM

## 2014-04-18 DIAGNOSIS — M549 Dorsalgia, unspecified: Secondary | ICD-10-CM

## 2014-04-18 DIAGNOSIS — G8929 Other chronic pain: Secondary | ICD-10-CM | POA: Insufficient documentation

## 2014-04-18 NOTE — Progress Notes (Signed)
Subjective:     Patient ID: [name], female   DOB: Jun 29, 1956, 58 y.o.   MRN: [id number]  HPI  Note: Portions of this report may have been transcribed using voice recognition software. Every effort was made to ensure accuracy; however, inadvertent computerized transcription errors may be present.   Any transcriptional errors that result from this process are unintentional.            [name]  1955/09/10 [id number]  Patient Care Team: Lillard Anes, MD as PCP - General (Family Medicine) Reita Cliche. Revankar, MD as Consulting Physician (Cardiology) Newman Pies, MD as Consulting Physician (Neurosurgery) Archie Jearld Adjutant., MD as Consulting Physician (Cardiology) Park Liter, MD as Consulting Physician (Cardiology)  This patient is a 58 y.o.female who presents today for surgical evaluation at the request of Dr. Henrene Pastor.   Reason for visit: Abdominal pain with incisional hernia  Pleasant woman with chronic abdominal and back pain on chronic narcotics.  History of cholecystectomy and appendectomy.  Had an episode of bowel obstruction requiring open lysis of adhesions 4 years ago -- girl.  She tells me that she had a heart attack on the table.  Stop smoking.  Can walk a few blocks before her crampy back and abdominal pain bothers her.  She has been having worsening nausea.  Has chronic heartburn and reflux.  Mostly stable on proton pump inhibitor.  Takes intermittent Zofran for nausea.  She has noted some increase abdominal bulging and pain especially with activity.  Incisional hernia felt.  Concerned.  Surgical consultation requested.  She describes episodes of severe sharp episodes of nausea and vomiting.  Usually happens before she can reach for the Zofran medication.  Has a bowel movement every other day with the help of medications.  She is concerned that the hernia is causing blockages and can.  He wishes to consider surgery.  She was told that she was  an increased operative risk and I did not want to do her in [place].  Therefore second opinion requested in [place].  There are no active problems to display for this patient.   Past Medical History  Diagnosis Date  . NICM (nonischemic cardiomyopathy)   . HLD (hyperlipidemia)   . Chronic back pain   . PVCs (premature ventricular contractions)   . Heart failure   . Left ventricular systolic dysfunction   . CHF (congestive heart failure)   . ICD (implantable cardiac defibrillator) in place 2012  . GERD (gastroesophageal reflux disease)   . Arthritis     Past Surgical History  Procedure Laterality Date  . Neck surgery      fused  . Carpal tunnel release    . Hand surgery    . Appendectomy    . Tonsillectomy    . Cesarean section    . Cholecystectomy    . Intestinal blockage 2011    . Ulnar nerve transposition  01/23/2012    Procedure: ULNAR NERVE DECOMPRESSION/TRANSPOSITION;  Surgeon: Ophelia Charter, MD;  Location: [facility];  Service: Neurosurgery;  Laterality: Left;  LEFT ulnar nerve decompression    History   Social History  . Marital Status: Single    Spouse Name: N/A    Number of Children: 3  . Years of Education: N/A   Occupational History  . disabled    Social History Main Topics  . Smoking status: Former Smoker -- 0.50 packs/day for 20 years    Types: Cigarettes    Quit  date: 08/11/2010  . Smokeless tobacco: Never Used  . Alcohol Use: No  . Drug Use: No  . Sexual Activity: Not Currently   Other Topics Concern  . Not on file   Social History Narrative  . No narrative on file    Family History  Problem Relation Age of Onset  . Heart attack    . Cancer    . Heart failure    . Anesthesia problems Neg Hx   . Hypotension Neg Hx   . Malignant hyperthermia Neg Hx   . Pseudochol deficiency Neg Hx     Current Outpatient Prescriptions  Medication Sig Dispense Refill  . esomeprazole (NEXIUM) 20 MG capsule Take 20 mg by mouth daily at 12  noon.      Marland Kitchen albuterol-ipratropium (COMBIVENT) 18-103 MCG/ACT inhaler Inhale 2 puffs into the lungs every 6 (six) hours as needed. For shortness of breath      . aspirin 81 MG tablet Take 81 mg by mouth daily.        . carvedilol (COREG) 3.125 MG tablet Take 3.125 mg by mouth 2 (two) times daily with a meal.      . esomeprazole (NEXIUM) 40 MG capsule Take 40 mg by mouth as needed. For acid reflux      . fentaNYL (DURAGESIC - DOSED MCG/HR) 75 MCG/HR Place 1 patch onto the skin every other day.       . Fluticasone-Salmeterol (ADVAIR) 250-50 MCG/DOSE AEPB Inhale 1 puff into the lungs 2 (two) times daily as needed.       . furosemide (LASIX) 20 MG tablet Take 20 mg by mouth 2 (two) times daily.       . isosorbide mononitrate (IMDUR) 30 MG 24 hr tablet Take 30 mg by mouth daily.      Marland Kitchen lisinopril (PRINIVIL,ZESTRIL) 5 MG tablet Take 2.5 mg by mouth daily.       . simvastatin (ZOCOR) 20 MG tablet Take 20 mg by mouth at bedtime.         No current facility-administered medications for this visit.     No Known Allergies  BP 124/80  Pulse 70  Temp(Src) 98.1 F (36.7 C) (Oral)  Ht 5\' 1"  (1.549 m)  Wt 141 lb 2 oz (64.014 kg)  BMI 26.68 kg/m2  No results found.   Review of Systems  Constitutional: Negative for fever, chills, diaphoresis, appetite change and fatigue.  HENT: Negative for ear discharge, ear pain, sore throat and trouble swallowing.   Eyes: Negative for photophobia, discharge and visual disturbance.  Respiratory: Negative for cough, choking, chest tightness and shortness of breath.   Cardiovascular: Negative for chest pain and palpitations.  Gastrointestinal: Negative for nausea, vomiting, abdominal pain, diarrhea, constipation, anal bleeding and rectal pain.  Endocrine: Negative for cold intolerance and heat intolerance.  Genitourinary: Negative for dysuria, frequency and difficulty urinating.  Musculoskeletal: Negative for gait problem, myalgias and neck pain.  Skin: Negative  for color change, pallor and rash.  Allergic/Immunologic: Negative for environmental allergies, food allergies and immunocompromised state.  Neurological: Negative for dizziness, speech difficulty, weakness and numbness.  Hematological: Negative for adenopathy.  Psychiatric/Behavioral: Negative for confusion and agitation. The patient is not nervous/anxious.        Objective:   Physical Exam  Constitutional: She is oriented to person, place, and time. She appears well-developed and well-nourished. No distress.  HENT:  Head: Normocephalic.  Mouth/Throat: Oropharynx is clear and moist. No oropharyngeal exudate.  Eyes: Conjunctivae and EOM are normal.  Pupils are equal, round, and reactive to light. No scleral icterus.  Neck: Normal range of motion. Neck supple. No tracheal deviation present.  Cardiovascular: Normal rate, regular rhythm and intact distal pulses.   Pulmonary/Chest: Effort normal and breath sounds normal. No stridor. No respiratory distress. She exhibits no tenderness.  Abdominal: Soft. She exhibits no distension and no mass. There is tenderness in the epigastric area and periumbilical area. There is no rigidity, no rebound, no guarding, no tenderness at McBurney's point and negative Murphy's sign. A hernia is present. Hernia confirmed positive in the ventral area. Hernia confirmed negative in the right inguinal area and confirmed negative in the left inguinal area.    Genitourinary: No vaginal discharge found.  Musculoskeletal: Normal range of motion. She exhibits no tenderness.       Right elbow: She exhibits normal range of motion.       Left elbow: She exhibits normal range of motion.       Right wrist: She exhibits normal range of motion.       Left wrist: She exhibits normal range of motion.       Right hand: Normal strength noted.       Left hand: Normal strength noted.  Lymphadenopathy:       Head (right side): No posterior auricular adenopathy present.       Head  (left side): No posterior auricular adenopathy present.    She has no cervical adenopathy.    She has no axillary adenopathy.       Right: No inguinal adenopathy present.       Left: No inguinal adenopathy present.  Neurological: She is alert and oriented to person, place, and time. No cranial nerve deficit. She exhibits normal muscle tone. Coordination normal.  Skin: Skin is warm and dry. No rash noted. She is not diaphoretic. No erythema.  Psychiatric: She has a normal mood and affect. Her behavior is normal. Judgment and thought content normal.   CLINICAL DATA: Periumbilical abdominal pain.  EXAM: CT ABDOMEN AND PELVIS WITH CONTRAST  TECHNIQUE: Multidetector CT imaging of the abdomen and pelvis was performed using the standard protocol following bolus administration of intravenous contrast.  CONTRAST: 100 mL of Isovue 370 intravenously.  COMPARISON: CT scan of August 04, 2010.  FINDINGS: Severe degenerative disc disease is noted at L5-S1. Minimal atelectasis is noted posteriorly in the right lung base.  Status post cholecystectomy. The liver, spleen and pancreas appear normal. Small fat containing ventral hernia is noted in epigastric region. Adrenal glands appear normal. No hydronephrosis or renal obstruction is noted. No renal or ureteral calculi are noted. Stool is noted throughout the colon. There is no evidence of bowel obstruction. No abnormal fluid collection is noted. Urinary bladder is minimally distended. Uterus appears normal. No significant adenopathy is noted.  IMPRESSION: Stool is noted throughout the colon and rectum which may represent constipation.  Small fat containing ventral hernia is noted in the epigastric region without evidence of incarcerated or obstructed bowel.   Electronically Signed By: Sabino Dick M.D. On: 01/04/2014 09:17   Embedded Images (not for diagnostic purposes)     Signs and Symptoms:    History:  789.05 ABDOMINAL PAIN,  PERIUMBILIC  Comments:  [id number] ISOVUE        Assessment:     Ventral incisional hernia incarcerated with omentum.  Probable contributes to abdominal pain but unlikely source of nausea and vomiting.  Chronic constipation with moderate stool burden seen on last CAT scan.  Cardiomyopathy  and COPD.  Answered of tobacco.  Cardiac risk above-average.  Severity not certain.     Plan:     Most likely she would have some benefit to repair of the hernias given her pain.  However I cautioned her that this would not solve all her issues.  She no longer smokes and her cardiopulmonary disease not seem to be rapidly deteriorating.  We need records from her cardiologist and medicine doctors to see what sort of operative risk this patient is & she if she is willing to take the risk.  Reasonable to do laparoscopic approach with mesh.  Unknown how severe adhesions will be in the operating room but probably high as she required lysis of adhesions in the past.  The anatomy & physiology of the abdominal wall was discussed.  The pathophysiology of hernias was discussed.  Natural history risks without surgery including progeressive enlargement, pain, incarceration & strangulation was discussed.   Contributors to complications such as smoking, obesity, diabetes, prior surgery, etc were discussed.   I feel the risks of no intervention will lead to serious problems that outweigh the operative risks; therefore, I recommended surgery to reduce and repair the hernia.  I explained laparoscopic techniques with possible need for an open approach.  I noted the probable use of mesh to patch and/or buttress the hernia repair  Risks such as bleeding, infection, abscess, need for further treatment, heart attack, death, and other risks were discussed.  I noted a good likelihood this will help address the problem.   Goals of post-operative recovery were discussed as well.  Possibility that this will not correct all symptoms was  explained.  I stressed the importance of low-impact activity, aggressive pain control, avoiding constipation, & not pushing through pain to minimize risk of post-operative chronic pain or injury. Possibility of reherniation especially with smoking, obesity, diabetes, immunosuppression, and other health conditions was discussed.  We will work to minimize complications.     An educational handout further explaining the pathology & treatment options was given as well.  Questions were answered.  The patient expresses understanding & wishes to proceed with surgery.  I strongly recommended she increase and improve her fiber bowel regimen until she is having one soft bowel movement a day.  I noted patient's on chronic narcotics always struggle with chronic constipation bloating and nausea and abdominal complaints.  Until she is completely off narcotics, her digestive tract is going to not work normally.

## 2014-04-18 NOTE — Patient Instructions (Addendum)
Please consider the recommendations that we have given you today:  Consider surgery to repair the hernia in your abdomen.  We will need clearance from your cardiologist first.  Correct your constipation!!  Try and minimize narcotics as that makes constipation worse.  Help US obtain records from your numerous specialists (medicine, gastroenterology, cardiology).  See the Handout(s) we have given you.  Please call our office at [phone] if you wish to schedule surgery or if you have further questions / concerns.   GETTING TO GOOD BOWEL HEALTH. Irregular bowel habits such as constipation and diarrhea can lead to many problems over time.  Having one soft bowel movement a day is the most important way to prevent further problems.  The anorectal canal is designed to handle stretching and feces to safely manage our ability to get rid of solid waste (feces, poop, stool) out of our body.  BUT, hard constipated stools can act like ripping concrete bricks and diarrhea can be a burning fire to this very sensitive area of our body, causing inflamed hemorrhoids, anal fissures, increasing risk is perirectal abscesses, abdominal pain/bloating, an making irritable bowel worse.     The goal: ONE SOFT BOWEL MOVEMENT A DAY!  To have soft, regular bowel movements:    Drink at least 8 tall glasses of water a day.     Take plenty of fiber.  Fiber is the undigested part of plant food that passes into the colon, acting s "natures broom" to encourage bowel motility and movement.  Fiber can absorb and hold large amounts of water. This results in a larger, bulkier stool, which is soft and easier to pass. Work gradually over several weeks up to 6 servings a day of fiber (25g a day even more if needed) in the form of: o Vegetables -- Root (potatoes, carrots, turnips), leafy green (lettuce, salad greens, celery, spinach), or cooked high residue (cabbage, broccoli, etc) o Fruit -- Fresh (unpeeled skin & pulp), Dried  (prunes, apricots, cherries, etc ),  or stewed ( applesauce)  o Whole grain breads, pasta, etc (whole wheat)  o Bran cereals    Bulking Agents -- This type of water-retaining fiber generally is easily obtained each day by one of the following:  o Psyllium bran -- The psyllium plant is remarkable because its ground seeds can retain so much water. This product is available as Metamucil, Konsyl, Effersyllium, Per Diem Fiber, or the less expensive generic preparation in drug and health food stores. Although labeled a laxative, it really is not a laxative.  o Methylcellulose -- This is another fiber derived from wood which also retains water. It is available as Citrucel. o Polyethylene Glycol - and "artificial" fiber commonly called Miralax or Glycolax.  It is helpful for people with gassy or bloated feelings with regular fiber o Flax Seed - a less gassy fiber than psyllium   No reading or other relaxing activity while on the toilet. If bowel movements take longer than 5 minutes, you are too constipated   AVOID CONSTIPATION.  High fiber and water intake usually takes care of this.  Sometimes a laxative is needed to stimulate more frequent bowel movements, but    Laxatives are not a good long-term solution as it can wear the colon out. o Osmotics (Milk of Magnesia, Fleets phosphosoda, Magnesium citrate, MiraLax, GoLytely) are safer than  o Stimulants (Senokot, Castor Oil, Dulcolax, Ex Lax)    o Do not take laxatives for more than 7days in a row.  IF SEVERELY CONSTIPATED, try a Bowel Retraining Program: o Do not use laxatives.  o Eat a diet high in roughage, such as bran cereals and leafy vegetables.  o Drink six (6) ounces of prune or apricot juice each morning.  o Eat two (2) large servings of stewed fruit each day.  o Take one (1) heaping tablespoon of a psyllium-based bulking agent twice a day. Use sugar-free sweetener when possible to avoid excessive calories.  o Eat a normal breakfast.  o Set  aside 15 minutes after breakfast to sit on the toilet, but do not strain to have a bowel movement.  o If you do not have a bowel movement by the third day, use an enema and repeat the above steps.    Controlling diarrhea o Switch to liquids and simpler foods for a few days to avoid stressing your intestines further. o Avoid dairy products (especially milk & ice cream) for a short time.  The intestines often can lose the ability to digest lactose when stressed. o Avoid foods that cause gassiness or bloating.  Typical foods include beans and other legumes, cabbage, broccoli, and dairy foods.  Every person has some sensitivity to other foods, so listen to our body and avoid those foods that trigger problems for you. o Adding fiber (Citrucel, Metamucil, psyllium, Miralax) gradually can help thicken stools by absorbing excess fluid and retrain the intestines to act more normally.  Slowly increase the dose over a few weeks.  Too much fiber too soon can backfire and cause cramping & bloating. o Probiotics (such as active yogurt, Align, etc) may help repopulate the intestines and colon with normal bacteria and calm down a sensitive digestive tract.  Most studies show it to be of mild help, though, and such products can be costly. o Medicines:   Bismuth subsalicylate (ex. Kayopectate, Pepto Bismol) every 30 minutes for up to 6 doses can help control diarrhea.  Avoid if pregnant.   Loperamide (Immodium) can slow down diarrhea.  Start with two tablets (4mg  total) first and then try one tablet every 6 hours.  Avoid if you are having fevers or severe pain.  If you are not better or start feeling worse, stop all medicines and call your doctor for advice o Call your doctor if you are getting worse or not better.  Sometimes further testing (cultures, endoscopy, X-ray studies, bloodwork, etc) may be needed to help diagnose and treat the cause of the diarrhea.  Hernia A hernia occurs when an internal organ pushes out  through a weak spot in the abdominal wall. Hernias most commonly occur in the groin and around the navel. Hernias often can be pushed back into place (reduced). Most hernias tend to get worse over time. Some abdominal hernias can get stuck in the opening (irreducible or incarcerated hernia) and cannot be reduced. An irreducible abdominal hernia which is tightly squeezed into the opening is at risk for impaired blood supply (strangulated hernia). A strangulated hernia is a medical emergency. Because of the risk for an irreducible or strangulated hernia, surgery may be recommended to repair a hernia. CAUSES   Heavy lifting.  Prolonged coughing.  Straining to have a bowel movement.  A cut (incision) made during an abdominal surgery. HOME CARE INSTRUCTIONS   Bed rest is not required. You may continue your normal activities.  Avoid lifting more than 10 pounds (4.5 kg) or straining.  Cough gently. If you are a smoker it is best to stop. Even the best hernia  repair can break down with the continual strain of coughing. Even if you do not have your hernia repaired, a cough will continue to aggravate the problem.  Do not wear anything tight over your hernia. Do not try to keep it in with an outside bandage or truss. These can damage abdominal contents if they are trapped within the hernia sac.  Eat a normal diet.  Avoid constipation. Straining over long periods of time will increase hernia size and encourage breakdown of repairs. If you cannot do this with diet alone, stool softeners may be used. SEEK IMMEDIATE MEDICAL CARE IF:   You have a fever.  You develop increasing abdominal pain.  You feel nauseous or vomit.  Your hernia is stuck outside the abdomen, looks discolored, feels hard, or is tender.  You have any changes in your bowel habits or in the hernia that are unusual for you.  You have increased pain or swelling around the hernia.  You cannot push the hernia back in place by  applying gentle pressure while lying down. MAKE SURE YOU:   Understand these instructions.  Will watch your condition.  Will get help right away if you are not doing well or get worse. Document Released: 08/12/2005 Document Revised: 11/04/2011 Document Reviewed: 03/31/2008 [facility] Patient Information 2015 [place], [place]. This information is not intended to replace advice given to you by your health care provider. Make sure you discuss any questions you have with your health care provider.  Nausea and Vomiting Nausea is a sick feeling that often comes before throwing up (vomiting). Vomiting is a reflex where stomach contents come out of your mouth. Vomiting can cause severe loss of body fluids (dehydration). Children and elderly adults can become dehydrated quickly, especially if they also have diarrhea. Nausea and vomiting are symptoms of a condition or disease. It is important to find the cause of your symptoms. CAUSES   Direct irritation of the stomach lining. This irritation can result from increased acid production (gastroesophageal reflux disease), infection, food poisoning, taking certain medicines (such as nonsteroidal anti-inflammatory drugs), alcohol use, or tobacco use.  Signals from the brain.These signals could be caused by a headache, heat exposure, an inner ear disturbance, increased pressure in the brain from injury, infection, a tumor, or a concussion, pain, emotional stimulus, or metabolic problems.  An obstruction in the gastrointestinal tract (bowel obstruction).  Illnesses such as diabetes, hepatitis, gallbladder problems, appendicitis, kidney problems, cancer, sepsis, atypical symptoms of a heart attack, or eating disorders.  Medical treatments such as chemotherapy and radiation.  Receiving medicine that makes you sleep (general anesthetic) during surgery. DIAGNOSIS Your caregiver may ask for tests to be done if the problems do not improve after a few days. Tests  may also be done if symptoms are severe or if the reason for the nausea and vomiting is not clear. Tests may include:  Urine tests.  Blood tests.  Stool tests.  Cultures (to look for evidence of infection).  X-rays or other imaging studies. Test results can help your caregiver make decisions about treatment or the need for additional tests. TREATMENT You need to stay well hydrated. Drink frequently but in small amounts.You may wish to drink water, sports drinks, clear broth, or eat frozen ice pops or gelatin dessert to help stay hydrated.When you eat, eating slowly may help prevent nausea.There are also some antinausea medicines that may help prevent nausea. HOME CARE INSTRUCTIONS   Take all medicine as directed by your caregiver.  If you do  not have an appetite, do not force yourself to eat. However, you must continue to drink fluids.  If you have an appetite, eat a normal diet unless your caregiver tells you differently.  Eat a variety of complex carbohydrates (rice, wheat, potatoes, bread), lean meats, yogurt, fruits, and vegetables.  Avoid high-fat foods because they are more difficult to digest.  Drink enough water and fluids to keep your urine clear or pale yellow.  If you are dehydrated, ask your caregiver for specific rehydration instructions. Signs of dehydration may include:  Severe thirst.  Dry lips and mouth.  Dizziness.  Dark urine.  Decreasing urine frequency and amount.  Confusion.  Rapid breathing or pulse. SEEK IMMEDIATE MEDICAL CARE IF:   You have blood or brown flecks (like coffee grounds) in your vomit.  You have black or bloody stools.  You have a severe headache or stiff neck.  You are confused.  You have severe abdominal pain.  You have chest pain or trouble breathing.  You do not urinate at least once every 8 hours.  You develop cold or clammy skin.  You continue to vomit for longer than 24 to 48 hours.  You have a fever. MAKE  SURE YOU:   Understand these instructions.  Will watch your condition.  Will get help right away if you are not doing well or get worse. Document Released: 08/12/2005 Document Revised: 11/04/2011 Document Reviewed: 01/09/2011 [facility] Patient Information 2015 [place], [place]. This information is not intended to replace advice given to you by your health care provider. Make sure you discuss any questions you have with your health care provider.  Constipation Constipation is when a person has fewer than three bowel movements a week, has difficulty having a bowel movement, or has stools that are dry, hard, or larger than normal. As people grow older, constipation is more common. If you try to fix constipation with medicines that make you have a bowel movement (laxatives), the problem may get worse. Long-term laxative use may cause the muscles of the colon to become weak. A low-fiber diet, not taking in enough fluids, and taking certain medicines may make constipation worse.  CAUSES   Certain medicines, such as antidepressants, pain medicine, iron supplements, antacids, and water pills.   Certain diseases, such as diabetes, irritable bowel syndrome (IBS), thyroid disease, or depression.   Not drinking enough water.   Not eating enough fiber-rich foods.   Stress or travel.   Lack of physical activity or exercise.   Ignoring the urge to have a bowel movement.   Using laxatives too much.  SIGNS AND SYMPTOMS   Having fewer than three bowel movements a week.   Straining to have a bowel movement.   Having stools that are hard, dry, or larger than normal.   Feeling full or bloated.   Pain in the lower abdomen.   Not feeling relief after having a bowel movement.  DIAGNOSIS  Your health care provider will take a medical history and perform a physical exam. Further testing may be done for severe constipation. Some tests may include:  A barium enema X-ray to examine your  rectum, colon, and, sometimes, your small intestine.   A sigmoidoscopy to examine your lower colon.   A colonoscopy to examine your entire colon. TREATMENT  Treatment will depend on the severity of your constipation and what is causing it. Some dietary treatments include drinking more fluids and eating more fiber-rich foods. Lifestyle treatments may include regular exercise. If these diet  and lifestyle recommendations do not help, your health care provider may recommend taking over-the-counter laxative medicines to help you have bowel movements. Prescription medicines may be prescribed if over-the-counter medicines do not work.  HOME CARE INSTRUCTIONS   Eat foods that have a lot of fiber, such as fruits, vegetables, whole grains, and beans.  Limit foods high in fat and processed sugars, such as french fries, hamburgers, cookies, candies, and soda.   A fiber supplement may be added to your diet if you cannot get enough fiber from foods.   Drink enough fluids to keep your urine clear or pale yellow.   Exercise regularly or as directed by your health care provider.   Go to the restroom when you have the urge to go. Do not hold it.   Only take over-the-counter or prescription medicines as directed by your health care provider. Do not take other medicines for constipation without talking to your health care provider first.  [place] IF:   You have bright red blood in your stool.   Your constipation lasts for more than 4 days or gets worse.   You have abdominal or rectal pain.   You have thin, pencil-like stools.   You have unexplained weight loss. MAKE SURE YOU:   Understand these instructions.  Will watch your condition.  Will get help right away if you are not doing well or get worse. Document Released: 05/10/2004 Document Revised: 08/17/2013 Document Reviewed: 05/24/2013 [facility] Patient Information 2015 [place], [place]. This information is not  intended to replace advice given to you by your health care provider. Make sure you discuss any questions you have with your health care provider.  Chronic Pain Chronic pain can be defined as pain that is off and on and lasts for 3-6 months or longer. Many things cause chronic pain, which can make it difficult to make a diagnosis. There are many treatment options available for chronic pain. However, finding a treatment that works well for you may require trying various approaches until the right one is found. Many people benefit from a combination of two or more types of treatment to control their pain. SYMPTOMS  Chronic pain can occur anywhere in the body and can range from mild to very severe. Some types of chronic pain include:  Headache.  Low back pain.  Cancer pain.  Arthritis pain.  Neurogenic pain. This is pain resulting from damage to nerves. People with chronic pain may also have other symptoms such as:  Depression.  Anger.  Insomnia.  Anxiety. DIAGNOSIS  Your health care provider will help diagnose your condition over time. In many cases, the initial focus will be on excluding possible conditions that could be causing the pain. Depending on your symptoms, your health care provider may order tests to diagnose your condition. Some of these tests may include:   Blood tests.   CT scan.   MRI.   X-rays.   Ultrasounds.   Nerve conduction studies.  You may need to see a specialist.  TREATMENT  Finding treatment that works well may take time. You may be referred to a pain specialist. He or she may prescribe medicine or therapies, such as:   Mindful meditation or yoga.  Shots (injections) of numbing or pain-relieving medicines into the spine or area of pain.  Local electrical stimulation.  Acupuncture.   Massage therapy.   Aroma, color, light, or sound therapy.   Biofeedback.   Working with a physical therapist to keep from getting  stiff.    Regular, gentle exercise.   Cognitive or behavioral therapy.   Group support.  Sometimes, surgery may be recommended.  HOME CARE INSTRUCTIONS   Take all medicines as directed by your health care provider.   Lessen stress in your life by relaxing and doing things such as listening to calming music.   Exercise or be active as directed by your health care provider.   Eat a healthy diet and include things such as vegetables, fruits, fish, and lean meats in your diet.   Keep all follow-up appointments with your health care provider.   Attend a support group with others suffering from chronic pain. SEEK MEDICAL CARE IF:   Your pain gets worse.   You develop a new pain that was not there before.   You cannot tolerate medicines given to you by your health care provider.   You have new symptoms since your last visit with your health care provider.  SEEK IMMEDIATE MEDICAL CARE IF:   You feel weak.   You have decreased sensation or numbness.   You lose control of bowel or bladder function.   Your pain suddenly gets much worse.   You develop shaking.  You develop chills.  You develop confusion.  You develop chest pain.  You develop shortness of breath.  MAKE SURE YOU:  Understand these instructions.  Will watch your condition.  Will get help right away if you are not doing well or get worse. Document Released: 05/04/2002 Document Revised: 04/14/2013 Document Reviewed: 02/05/2013 [facility] Patient Information 2015 [place], [place]. This information is not intended to replace advice given to you by your health care provider. Make sure you discuss any questions you have with your health care provider.

## 2014-04-19 ENCOUNTER — Encounter (INDEPENDENT_AMBULATORY_CARE_PROVIDER_SITE_OTHER): Payer: Self-pay | Admitting: Surgery

## 2014-04-19 DIAGNOSIS — K430 Incisional hernia with obstruction, without gangrene: Secondary | ICD-10-CM

## 2014-04-19 DIAGNOSIS — R110 Nausea: Secondary | ICD-10-CM | POA: Insufficient documentation

## 2014-04-19 DIAGNOSIS — K56609 Unspecified intestinal obstruction, unspecified as to partial versus complete obstruction: Secondary | ICD-10-CM | POA: Insufficient documentation

## 2014-04-19 HISTORY — DX: Incisional hernia with obstruction, without gangrene: K43.0

## 2014-04-19 HISTORY — DX: Nausea: R11.0

## 2014-04-21 ENCOUNTER — Encounter (INDEPENDENT_AMBULATORY_CARE_PROVIDER_SITE_OTHER): Payer: Self-pay

## 2014-04-21 ENCOUNTER — Telehealth (INDEPENDENT_AMBULATORY_CARE_PROVIDER_SITE_OTHER): Payer: Self-pay

## 2014-04-21 NOTE — Telephone Encounter (Signed)
Called to see about faxing the cardiac clearance request with instructions on how to handle the pacemaker for surgery. The pt advised me that she has not see Dr Agustin Cree in a while that she normally see's the [facility] so I wanted to see who I needed to send the clearance to. I was advised send the clearance to Dr Agustin Cree and they would get back in touch with me.

## 2014-04-21 NOTE — Telephone Encounter (Signed)
Pt will need to be seen before surgery is scheduled.  Appointment is 05/04/14 at 8:45 a.m.  Pt made aware.

## 2014-04-26 DIAGNOSIS — IMO0002 Reserved for concepts with insufficient information to code with codable children: Secondary | ICD-10-CM | POA: Diagnosis not present

## 2014-04-26 DIAGNOSIS — Z79899 Other long term (current) drug therapy: Secondary | ICD-10-CM | POA: Diagnosis not present

## 2014-04-26 DIAGNOSIS — G894 Chronic pain syndrome: Secondary | ICD-10-CM | POA: Diagnosis not present

## 2014-04-26 DIAGNOSIS — Z5181 Encounter for therapeutic drug level monitoring: Secondary | ICD-10-CM | POA: Diagnosis not present

## 2014-04-26 DIAGNOSIS — M25569 Pain in unspecified knee: Secondary | ICD-10-CM | POA: Diagnosis not present

## 2014-05-04 DIAGNOSIS — Z01810 Encounter for preprocedural cardiovascular examination: Secondary | ICD-10-CM | POA: Diagnosis not present

## 2014-05-04 DIAGNOSIS — G8928 Other chronic postprocedural pain: Secondary | ICD-10-CM | POA: Diagnosis not present

## 2014-05-04 DIAGNOSIS — Z95810 Presence of automatic (implantable) cardiac defibrillator: Secondary | ICD-10-CM | POA: Diagnosis not present

## 2014-05-04 DIAGNOSIS — I509 Heart failure, unspecified: Secondary | ICD-10-CM | POA: Diagnosis not present

## 2014-05-04 DIAGNOSIS — Z79899 Other long term (current) drug therapy: Secondary | ICD-10-CM | POA: Diagnosis not present

## 2014-05-04 DIAGNOSIS — E785 Hyperlipidemia, unspecified: Secondary | ICD-10-CM | POA: Diagnosis not present

## 2014-05-04 DIAGNOSIS — R002 Palpitations: Secondary | ICD-10-CM | POA: Diagnosis not present

## 2014-05-05 DIAGNOSIS — Z01810 Encounter for preprocedural cardiovascular examination: Secondary | ICD-10-CM | POA: Diagnosis not present

## 2014-05-05 DIAGNOSIS — I509 Heart failure, unspecified: Secondary | ICD-10-CM | POA: Diagnosis not present

## 2014-05-05 DIAGNOSIS — R002 Palpitations: Secondary | ICD-10-CM | POA: Diagnosis not present

## 2014-05-19 DIAGNOSIS — I428 Other cardiomyopathies: Secondary | ICD-10-CM | POA: Diagnosis not present

## 2014-05-19 DIAGNOSIS — I509 Heart failure, unspecified: Secondary | ICD-10-CM | POA: Diagnosis not present

## 2014-05-26 DIAGNOSIS — I429 Cardiomyopathy, unspecified: Secondary | ICD-10-CM | POA: Diagnosis not present

## 2014-05-26 DIAGNOSIS — I509 Heart failure, unspecified: Secondary | ICD-10-CM | POA: Diagnosis not present

## 2014-05-26 DIAGNOSIS — Z95810 Presence of automatic (implantable) cardiac defibrillator: Secondary | ICD-10-CM | POA: Diagnosis not present

## 2014-05-26 DIAGNOSIS — E785 Hyperlipidemia, unspecified: Secondary | ICD-10-CM | POA: Diagnosis not present

## 2014-05-31 DIAGNOSIS — Z23 Encounter for immunization: Secondary | ICD-10-CM | POA: Diagnosis not present

## 2014-05-31 DIAGNOSIS — I509 Heart failure, unspecified: Secondary | ICD-10-CM | POA: Diagnosis not present

## 2014-06-02 DIAGNOSIS — Z79891 Long term (current) use of opiate analgesic: Secondary | ICD-10-CM | POA: Diagnosis not present

## 2014-06-02 DIAGNOSIS — G894 Chronic pain syndrome: Secondary | ICD-10-CM | POA: Diagnosis not present

## 2014-06-02 DIAGNOSIS — M5416 Radiculopathy, lumbar region: Secondary | ICD-10-CM | POA: Diagnosis not present

## 2014-06-09 DIAGNOSIS — Z0000 Encounter for general adult medical examination without abnormal findings: Secondary | ICD-10-CM | POA: Diagnosis not present

## 2014-06-09 DIAGNOSIS — I509 Heart failure, unspecified: Secondary | ICD-10-CM | POA: Diagnosis not present

## 2014-06-09 DIAGNOSIS — Z79899 Other long term (current) drug therapy: Secondary | ICD-10-CM | POA: Diagnosis not present

## 2014-06-16 DIAGNOSIS — I509 Heart failure, unspecified: Secondary | ICD-10-CM | POA: Diagnosis not present

## 2014-06-16 DIAGNOSIS — I493 Ventricular premature depolarization: Secondary | ICD-10-CM | POA: Diagnosis not present

## 2014-06-16 DIAGNOSIS — Z95810 Presence of automatic (implantable) cardiac defibrillator: Secondary | ICD-10-CM | POA: Diagnosis not present

## 2014-06-16 DIAGNOSIS — I429 Cardiomyopathy, unspecified: Secondary | ICD-10-CM | POA: Diagnosis not present

## 2014-06-16 DIAGNOSIS — E785 Hyperlipidemia, unspecified: Secondary | ICD-10-CM | POA: Diagnosis not present

## 2014-06-27 DIAGNOSIS — M5416 Radiculopathy, lumbar region: Secondary | ICD-10-CM | POA: Diagnosis not present

## 2014-06-27 DIAGNOSIS — M545 Low back pain: Secondary | ICD-10-CM | POA: Diagnosis not present

## 2014-06-27 DIAGNOSIS — Z79891 Long term (current) use of opiate analgesic: Secondary | ICD-10-CM | POA: Diagnosis not present

## 2014-06-27 DIAGNOSIS — G894 Chronic pain syndrome: Secondary | ICD-10-CM | POA: Diagnosis not present

## 2014-07-19 DIAGNOSIS — I509 Heart failure, unspecified: Secondary | ICD-10-CM | POA: Diagnosis not present

## 2014-07-19 DIAGNOSIS — Z95810 Presence of automatic (implantable) cardiac defibrillator: Secondary | ICD-10-CM | POA: Diagnosis not present

## 2014-07-19 DIAGNOSIS — E785 Hyperlipidemia, unspecified: Secondary | ICD-10-CM | POA: Diagnosis not present

## 2014-07-19 DIAGNOSIS — I429 Cardiomyopathy, unspecified: Secondary | ICD-10-CM | POA: Diagnosis not present

## 2014-07-27 DIAGNOSIS — I429 Cardiomyopathy, unspecified: Secondary | ICD-10-CM | POA: Diagnosis not present

## 2014-07-31 DIAGNOSIS — R1011 Right upper quadrant pain: Secondary | ICD-10-CM | POA: Diagnosis not present

## 2014-07-31 DIAGNOSIS — R1013 Epigastric pain: Secondary | ICD-10-CM | POA: Diagnosis not present

## 2014-07-31 DIAGNOSIS — K5792 Diverticulitis of intestine, part unspecified, without perforation or abscess without bleeding: Secondary | ICD-10-CM | POA: Diagnosis not present

## 2014-07-31 DIAGNOSIS — K5669 Other intestinal obstruction: Secondary | ICD-10-CM | POA: Diagnosis not present

## 2014-07-31 DIAGNOSIS — Z792 Long term (current) use of antibiotics: Secondary | ICD-10-CM | POA: Diagnosis not present

## 2014-07-31 DIAGNOSIS — K5730 Diverticulosis of large intestine without perforation or abscess without bleeding: Secondary | ICD-10-CM | POA: Diagnosis not present

## 2014-08-09 DIAGNOSIS — M5416 Radiculopathy, lumbar region: Secondary | ICD-10-CM | POA: Diagnosis not present

## 2014-08-09 DIAGNOSIS — G894 Chronic pain syndrome: Secondary | ICD-10-CM | POA: Diagnosis not present

## 2014-08-09 DIAGNOSIS — Z79891 Long term (current) use of opiate analgesic: Secondary | ICD-10-CM | POA: Diagnosis not present

## 2014-08-09 DIAGNOSIS — M25569 Pain in unspecified knee: Secondary | ICD-10-CM | POA: Diagnosis not present

## 2014-08-16 DIAGNOSIS — I493 Ventricular premature depolarization: Secondary | ICD-10-CM | POA: Diagnosis not present

## 2014-08-16 DIAGNOSIS — Z79899 Other long term (current) drug therapy: Secondary | ICD-10-CM | POA: Diagnosis not present

## 2014-08-16 DIAGNOSIS — Z95810 Presence of automatic (implantable) cardiac defibrillator: Secondary | ICD-10-CM | POA: Diagnosis not present

## 2014-08-16 DIAGNOSIS — E785 Hyperlipidemia, unspecified: Secondary | ICD-10-CM | POA: Diagnosis not present

## 2014-08-16 DIAGNOSIS — I429 Cardiomyopathy, unspecified: Secondary | ICD-10-CM | POA: Diagnosis not present

## 2014-08-16 DIAGNOSIS — I509 Heart failure, unspecified: Secondary | ICD-10-CM | POA: Diagnosis not present

## 2014-08-18 DIAGNOSIS — Z95810 Presence of automatic (implantable) cardiac defibrillator: Secondary | ICD-10-CM | POA: Diagnosis not present

## 2014-08-23 DIAGNOSIS — Z0000 Encounter for general adult medical examination without abnormal findings: Secondary | ICD-10-CM | POA: Diagnosis not present

## 2014-09-14 DIAGNOSIS — M545 Low back pain: Secondary | ICD-10-CM | POA: Diagnosis not present

## 2014-09-14 DIAGNOSIS — G894 Chronic pain syndrome: Secondary | ICD-10-CM | POA: Diagnosis not present

## 2014-09-14 DIAGNOSIS — M5416 Radiculopathy, lumbar region: Secondary | ICD-10-CM | POA: Diagnosis not present

## 2014-09-14 DIAGNOSIS — Z79891 Long term (current) use of opiate analgesic: Secondary | ICD-10-CM | POA: Diagnosis not present

## 2014-09-20 DIAGNOSIS — I509 Heart failure, unspecified: Secondary | ICD-10-CM | POA: Diagnosis not present

## 2014-09-20 DIAGNOSIS — Z95810 Presence of automatic (implantable) cardiac defibrillator: Secondary | ICD-10-CM | POA: Diagnosis not present

## 2014-09-20 DIAGNOSIS — I429 Cardiomyopathy, unspecified: Secondary | ICD-10-CM | POA: Diagnosis not present

## 2014-09-20 DIAGNOSIS — R002 Palpitations: Secondary | ICD-10-CM | POA: Diagnosis not present

## 2014-09-29 DIAGNOSIS — I5020 Unspecified systolic (congestive) heart failure: Secondary | ICD-10-CM | POA: Diagnosis not present

## 2014-09-29 DIAGNOSIS — E039 Hypothyroidism, unspecified: Secondary | ICD-10-CM | POA: Diagnosis not present

## 2014-09-29 DIAGNOSIS — J449 Chronic obstructive pulmonary disease, unspecified: Secondary | ICD-10-CM | POA: Diagnosis not present

## 2014-09-29 DIAGNOSIS — E785 Hyperlipidemia, unspecified: Secondary | ICD-10-CM | POA: Diagnosis not present

## 2014-09-29 DIAGNOSIS — Z6826 Body mass index (BMI) 26.0-26.9, adult: Secondary | ICD-10-CM | POA: Diagnosis not present

## 2014-09-29 DIAGNOSIS — I428 Other cardiomyopathies: Secondary | ICD-10-CM | POA: Diagnosis not present

## 2014-10-03 DIAGNOSIS — R079 Chest pain, unspecified: Secondary | ICD-10-CM | POA: Diagnosis not present

## 2014-10-03 DIAGNOSIS — I340 Nonrheumatic mitral (valve) insufficiency: Secondary | ICD-10-CM | POA: Diagnosis not present

## 2014-10-03 DIAGNOSIS — I429 Cardiomyopathy, unspecified: Secondary | ICD-10-CM | POA: Diagnosis not present

## 2014-10-06 DIAGNOSIS — G894 Chronic pain syndrome: Secondary | ICD-10-CM | POA: Diagnosis not present

## 2014-10-06 DIAGNOSIS — M25569 Pain in unspecified knee: Secondary | ICD-10-CM | POA: Diagnosis not present

## 2014-10-06 DIAGNOSIS — M545 Low back pain: Secondary | ICD-10-CM | POA: Diagnosis not present

## 2014-10-06 DIAGNOSIS — M5416 Radiculopathy, lumbar region: Secondary | ICD-10-CM | POA: Diagnosis not present

## 2014-10-18 DIAGNOSIS — I429 Cardiomyopathy, unspecified: Secondary | ICD-10-CM | POA: Diagnosis not present

## 2014-10-18 DIAGNOSIS — I509 Heart failure, unspecified: Secondary | ICD-10-CM | POA: Diagnosis not present

## 2014-11-02 DIAGNOSIS — G894 Chronic pain syndrome: Secondary | ICD-10-CM | POA: Diagnosis not present

## 2014-11-02 DIAGNOSIS — M25569 Pain in unspecified knee: Secondary | ICD-10-CM | POA: Diagnosis not present

## 2014-11-02 DIAGNOSIS — M545 Low back pain: Secondary | ICD-10-CM | POA: Diagnosis not present

## 2014-11-02 DIAGNOSIS — Z79891 Long term (current) use of opiate analgesic: Secondary | ICD-10-CM | POA: Diagnosis not present

## 2014-11-02 DIAGNOSIS — M5416 Radiculopathy, lumbar region: Secondary | ICD-10-CM | POA: Diagnosis not present

## 2014-11-04 DIAGNOSIS — Z1231 Encounter for screening mammogram for malignant neoplasm of breast: Secondary | ICD-10-CM | POA: Diagnosis not present

## 2014-11-10 DIAGNOSIS — J309 Allergic rhinitis, unspecified: Secondary | ICD-10-CM | POA: Diagnosis not present

## 2014-11-10 DIAGNOSIS — Z6826 Body mass index (BMI) 26.0-26.9, adult: Secondary | ICD-10-CM | POA: Diagnosis not present

## 2014-11-10 DIAGNOSIS — J019 Acute sinusitis, unspecified: Secondary | ICD-10-CM | POA: Diagnosis not present

## 2014-12-01 DIAGNOSIS — M545 Low back pain: Secondary | ICD-10-CM | POA: Diagnosis not present

## 2014-12-01 DIAGNOSIS — M5416 Radiculopathy, lumbar region: Secondary | ICD-10-CM | POA: Diagnosis not present

## 2014-12-01 DIAGNOSIS — G894 Chronic pain syndrome: Secondary | ICD-10-CM | POA: Diagnosis not present

## 2014-12-01 DIAGNOSIS — Z79891 Long term (current) use of opiate analgesic: Secondary | ICD-10-CM | POA: Diagnosis not present

## 2014-12-26 DIAGNOSIS — G894 Chronic pain syndrome: Secondary | ICD-10-CM | POA: Diagnosis not present

## 2014-12-26 DIAGNOSIS — M5416 Radiculopathy, lumbar region: Secondary | ICD-10-CM | POA: Diagnosis not present

## 2014-12-26 DIAGNOSIS — M545 Low back pain: Secondary | ICD-10-CM | POA: Diagnosis not present

## 2014-12-28 DIAGNOSIS — I509 Heart failure, unspecified: Secondary | ICD-10-CM | POA: Diagnosis not present

## 2014-12-28 DIAGNOSIS — I255 Ischemic cardiomyopathy: Secondary | ICD-10-CM | POA: Diagnosis not present

## 2015-01-03 DIAGNOSIS — Z6826 Body mass index (BMI) 26.0-26.9, adult: Secondary | ICD-10-CM | POA: Diagnosis not present

## 2015-01-03 DIAGNOSIS — J019 Acute sinusitis, unspecified: Secondary | ICD-10-CM | POA: Diagnosis not present

## 2015-01-19 DIAGNOSIS — J309 Allergic rhinitis, unspecified: Secondary | ICD-10-CM | POA: Diagnosis not present

## 2015-01-19 DIAGNOSIS — J019 Acute sinusitis, unspecified: Secondary | ICD-10-CM | POA: Diagnosis not present

## 2015-01-19 DIAGNOSIS — Z6826 Body mass index (BMI) 26.0-26.9, adult: Secondary | ICD-10-CM | POA: Diagnosis not present

## 2015-02-01 DIAGNOSIS — G894 Chronic pain syndrome: Secondary | ICD-10-CM | POA: Diagnosis not present

## 2015-02-01 DIAGNOSIS — M5416 Radiculopathy, lumbar region: Secondary | ICD-10-CM | POA: Diagnosis not present

## 2015-02-01 DIAGNOSIS — M545 Low back pain: Secondary | ICD-10-CM | POA: Diagnosis not present

## 2015-02-01 DIAGNOSIS — Z79891 Long term (current) use of opiate analgesic: Secondary | ICD-10-CM | POA: Diagnosis not present

## 2015-02-13 ENCOUNTER — Other Ambulatory Visit: Payer: Self-pay | Admitting: Surgery

## 2015-02-15 DIAGNOSIS — J309 Allergic rhinitis, unspecified: Secondary | ICD-10-CM | POA: Diagnosis not present

## 2015-02-23 DIAGNOSIS — I498 Other specified cardiac arrhythmias: Secondary | ICD-10-CM | POA: Diagnosis not present

## 2015-02-23 DIAGNOSIS — Z4502 Encounter for adjustment and management of automatic implantable cardiac defibrillator: Secondary | ICD-10-CM | POA: Diagnosis not present

## 2015-03-01 DIAGNOSIS — M545 Low back pain: Secondary | ICD-10-CM | POA: Diagnosis not present

## 2015-03-01 DIAGNOSIS — M5416 Radiculopathy, lumbar region: Secondary | ICD-10-CM | POA: Diagnosis not present

## 2015-03-01 DIAGNOSIS — Z79891 Long term (current) use of opiate analgesic: Secondary | ICD-10-CM | POA: Diagnosis not present

## 2015-03-01 DIAGNOSIS — G894 Chronic pain syndrome: Secondary | ICD-10-CM | POA: Diagnosis not present

## 2015-03-08 DIAGNOSIS — J441 Chronic obstructive pulmonary disease with (acute) exacerbation: Secondary | ICD-10-CM | POA: Diagnosis not present

## 2015-03-08 DIAGNOSIS — Z6825 Body mass index (BMI) 25.0-25.9, adult: Secondary | ICD-10-CM | POA: Diagnosis not present

## 2015-03-14 NOTE — Progress Notes (Signed)
Faxed for pacemake orders 03-09-15, second fax 03-13-15, phone call made 03-14-15

## 2015-03-16 ENCOUNTER — Encounter (HOSPITAL_COMMUNITY): Payer: Self-pay

## 2015-03-16 ENCOUNTER — Encounter (HOSPITAL_COMMUNITY)
Admission: RE | Admit: 2015-03-16 | Discharge: 2015-03-16 | Disposition: A | Discharge status: Home / Self Care | Payer: Medicare Other | Source: Ambulatory Visit | Attending: Surgery | Admitting: Surgery

## 2015-03-16 DIAGNOSIS — R0981 Nasal congestion: Secondary | ICD-10-CM | POA: Diagnosis not present

## 2015-03-16 DIAGNOSIS — H6521 Chronic serous otitis media, right ear: Secondary | ICD-10-CM | POA: Diagnosis not present

## 2015-03-16 DIAGNOSIS — Z01810 Encounter for preprocedural cardiovascular examination: Secondary | ICD-10-CM | POA: Insufficient documentation

## 2015-03-16 DIAGNOSIS — K432 Incisional hernia without obstruction or gangrene: Secondary | ICD-10-CM | POA: Diagnosis not present

## 2015-03-16 DIAGNOSIS — H9190 Unspecified hearing loss, unspecified ear: Secondary | ICD-10-CM | POA: Diagnosis not present

## 2015-03-16 DIAGNOSIS — H9319 Tinnitus, unspecified ear: Secondary | ICD-10-CM | POA: Diagnosis not present

## 2015-03-16 DIAGNOSIS — Z01812 Encounter for preprocedural laboratory examination: Secondary | ICD-10-CM | POA: Diagnosis not present

## 2015-03-16 DIAGNOSIS — J342 Deviated nasal septum: Secondary | ICD-10-CM | POA: Diagnosis not present

## 2015-03-16 HISTORY — DX: Acute myocardial infarction, unspecified: I21.9

## 2015-03-16 HISTORY — DX: Presence of automatic (implantable) cardiac defibrillator: Z95.810

## 2015-03-16 HISTORY — DX: Unspecified asthma, uncomplicated: J45.909

## 2015-03-16 LAB — BASIC METABOLIC PANEL
Anion gap: 8 (ref 5–15)
BUN: 18 mg/dL (ref 6–20)
CO2: 25 mmol/L (ref 22–32)
Calcium: 9.7 mg/dL (ref 8.9–10.3)
Chloride: 107 mmol/L (ref 101–111)
Creatinine, Ser: 0.84 mg/dL (ref 0.44–1.00)
GFR calc Af Amer: >60 mL/min (ref >60)
GFR calc non Af Amer: >60 mL/min (ref >60)
Glucose, Bld: 207 mg/dL — ABNORMAL HIGH (ref 65–99)
Potassium: 4.7 mmol/L (ref 3.5–5.1)
Sodium: 140 mmol/L (ref 135–145)

## 2015-03-16 LAB — CBC
HCT: 38.2 % (ref 36.0–46.0)
Hemoglobin: 12.8 g/dL (ref 12.0–15.0)
MCH: 30.9 pg (ref 26.0–34.0)
MCHC: 33.5 g/dL (ref 30.0–36.0)
MCV: 92.3 fL (ref 78.0–100.0)
Platelets: 389 10*3/uL (ref 150–400)
RBC: 4.14 MIL/uL (ref 3.87–5.11)
RDW: 12.4 % (ref 11.5–15.5)
WBC: 13.3 10*3/uL — ABNORMAL HIGH (ref 4.0–10.5)

## 2015-03-16 NOTE — Progress Notes (Signed)
CBC rsults done 03/16/2015 faxed via EPIC to Dr Johney [place].

## 2015-03-16 NOTE — Patient Instructions (Signed)
Henryetta Guastella Keimig  03/16/2015   Your procedure is scheduled on: 03/23/2015    Report to [facility] Main  Entrance take [place]  elevators to 3rd floor to  [place] at       1030AM.  Call this number if you have problems the morning of surgery [phone]   Remember: ONLY 1 PERSON MAY GO WITH YOU TO SHORT STAY TO GET  READY MORNING OF [place].  Do not eat food or drink liquids :After Midnight.     Take these medicines the morning of surgery with A SIP OF WATER: Combivent Inhaler if needed and bring, Carvedilol ( coreg), Advair if needed and bring, Nebulizer if needed, Hydrocodone if needed, Imdur, Ranexa                                You may not have any metal on your body including hair pins and              piercings  Do not wear jewelry, make-up, lotions, powders or perfumes, deodorant             Do not wear nail polish.  Do not shave  48 hours prior to surgery.              Do not bring valuables to the hospital. [place].  Contacts, dentures or bridgework may not be worn into surgery.  Leave suitcase in the car. After surgery it may be brought to your room.       Special Instructions: coughing and deep breathing exercises, leg exercises               Please read over the following fact sheets you were given: _____________________________________________________________________             [facility] - Preparing for Surgery Before surgery, you can play an important role.  Because skin is not sterile, your skin needs to be as free of germs as possible.  You can reduce the number of germs on your skin by washing with CHG (chlorahexidine gluconate) soap before surgery.  CHG is an antiseptic cleaner which kills germs and bonds with the skin to continue killing germs even after washing. Please DO NOT use if you have an allergy to CHG or antibacterial soaps.  If your skin becomes reddened/irritated  stop using the CHG and inform your nurse when you arrive at Short Stay. Do not shave (including legs and underarms) for at least 48 hours prior to the first CHG shower.  You may shave your face/neck. Please follow these instructions carefully:  1.  Shower with CHG Soap the night before surgery and the  morning of Surgery.  2.  If you choose to wash your hair, wash your hair first as usual with your  normal  shampoo.  3.  After you shampoo, rinse your hair and body thoroughly to remove the  shampoo.                           4.  Use CHG as you would any other liquid soap.  You can apply chg directly  to the skin and wash  Gently with a scrungie or clean washcloth.  5.  Apply the CHG Soap to your body ONLY FROM THE NECK DOWN.   Do not use on face/ open                           Wound or open sores. Avoid contact with eyes, ears mouth and genitals (private parts).                       Wash face,  Genitals (private parts) with your normal soap.             6.  Wash thoroughly, paying special attention to the area where your surgery  will be performed.  7.  Thoroughly rinse your body with warm water from the neck down.  8.  DO NOT shower/wash with your normal soap after using and rinsing off  the CHG Soap.                9.  Pat yourself dry with a clean towel.            10.  Wear clean pajamas.            11.  Place clean sheets on your bed the night of your first shower and do not  sleep with pets. Day of Surgery : Do not apply any lotions/deodorants the morning of surgery.  Please wear clean clothes to the hospital/surgery center.  FAILURE TO FOLLOW THESE INSTRUCTIONS MAY RESULT IN THE CANCELLATION OF YOUR SURGERY PATIENT SIGNATURE_________________________________  NURSE SIGNATURE__________________________________  ________________________________________________________________________

## 2015-03-16 NOTE — Progress Notes (Signed)
DR Laurie Panda ( anesth) aware patient to have anesthesia consult .  I do not have all of medical records after 3 requests.  Will review once receive all of medical info.

## 2015-03-16 NOTE — Progress Notes (Signed)
Last ICD check 02/23/2015 in [place].

## 2015-03-16 NOTE — Progress Notes (Signed)
Clearance from Dr Jenne Campus- 01/30/2015 on chart along with LOV note.  Still awaiting LOV note, ekg stress and echo after 4 attempts.  With last attempt had patient to sign a medical release form and faxed.

## 2015-03-17 ENCOUNTER — Encounter (HOSPITAL_COMMUNITY): Payer: Self-pay

## 2015-03-17 NOTE — Progress Notes (Addendum)
Office visit note from 09/20/2014 - [facility] - DR Jenne Campus on chart.  And office visit note from 12/28/14 on chart  EKG- 05/04/2014 on chart  ECHO- 10/03/2014 on chart Stress Test- 05/05/2014 on chart .  Dr Marcell Barlow( anesthesia) aware of above .  No further orders given.

## 2015-03-23 ENCOUNTER — Ambulatory Visit (HOSPITAL_COMMUNITY): Payer: Medicare Other | Admitting: Anesthesiology

## 2015-03-23 ENCOUNTER — Observation Stay (HOSPITAL_COMMUNITY)
Admission: RE | Admit: 2015-03-23 | Discharge: 2015-03-24 | Disposition: A | Discharge status: Home / Self Care | Payer: Medicare Other | Source: Ambulatory Visit | Attending: Surgery | Admitting: Surgery

## 2015-03-23 ENCOUNTER — Encounter (HOSPITAL_COMMUNITY)
Admission: RE | Disposition: A | Discharge status: Home / Self Care | Payer: Self-pay | Source: Ambulatory Visit | Attending: Surgery

## 2015-03-23 ENCOUNTER — Encounter (HOSPITAL_COMMUNITY): Payer: Self-pay | Admitting: *Deleted

## 2015-03-23 DIAGNOSIS — Z87891 Personal history of nicotine dependence: Secondary | ICD-10-CM

## 2015-03-23 DIAGNOSIS — G8929 Other chronic pain: Secondary | ICD-10-CM | POA: Diagnosis present

## 2015-03-23 DIAGNOSIS — K567 Ileus, unspecified: Secondary | ICD-10-CM | POA: Diagnosis not present

## 2015-03-23 DIAGNOSIS — M549 Dorsalgia, unspecified: Secondary | ICD-10-CM

## 2015-03-23 DIAGNOSIS — K219 Gastro-esophageal reflux disease without esophagitis: Secondary | ICD-10-CM | POA: Insufficient documentation

## 2015-03-23 DIAGNOSIS — Z9049 Acquired absence of other specified parts of digestive tract: Secondary | ICD-10-CM | POA: Diagnosis present

## 2015-03-23 DIAGNOSIS — Z95810 Presence of automatic (implantable) cardiac defibrillator: Secondary | ICD-10-CM

## 2015-03-23 DIAGNOSIS — K6389 Other specified diseases of intestine: Secondary | ICD-10-CM | POA: Diagnosis not present

## 2015-03-23 DIAGNOSIS — Z79891 Long term (current) use of opiate analgesic: Secondary | ICD-10-CM

## 2015-03-23 DIAGNOSIS — I429 Cardiomyopathy, unspecified: Secondary | ICD-10-CM

## 2015-03-23 DIAGNOSIS — J449 Chronic obstructive pulmonary disease, unspecified: Secondary | ICD-10-CM | POA: Insufficient documentation

## 2015-03-23 DIAGNOSIS — E785 Hyperlipidemia, unspecified: Secondary | ICD-10-CM | POA: Insufficient documentation

## 2015-03-23 DIAGNOSIS — Z79899 Other long term (current) drug therapy: Secondary | ICD-10-CM

## 2015-03-23 DIAGNOSIS — Z7982 Long term (current) use of aspirin: Secondary | ICD-10-CM

## 2015-03-23 DIAGNOSIS — K469 Unspecified abdominal hernia without obstruction or gangrene: Secondary | ICD-10-CM | POA: Diagnosis present

## 2015-03-23 DIAGNOSIS — I420 Dilated cardiomyopathy: Secondary | ICD-10-CM

## 2015-03-23 DIAGNOSIS — K660 Peritoneal adhesions (postprocedural) (postinfection): Secondary | ICD-10-CM | POA: Diagnosis present

## 2015-03-23 DIAGNOSIS — K439 Ventral hernia without obstruction or gangrene: Secondary | ICD-10-CM | POA: Diagnosis not present

## 2015-03-23 DIAGNOSIS — I509 Heart failure, unspecified: Secondary | ICD-10-CM | POA: Diagnosis present

## 2015-03-23 DIAGNOSIS — J45909 Unspecified asthma, uncomplicated: Secondary | ICD-10-CM | POA: Insufficient documentation

## 2015-03-23 DIAGNOSIS — F1190 Opioid use, unspecified, uncomplicated: Secondary | ICD-10-CM | POA: Diagnosis present

## 2015-03-23 DIAGNOSIS — Z8249 Family history of ischemic heart disease and other diseases of the circulatory system: Secondary | ICD-10-CM

## 2015-03-23 DIAGNOSIS — R112 Nausea with vomiting, unspecified: Secondary | ICD-10-CM | POA: Diagnosis not present

## 2015-03-23 DIAGNOSIS — I252 Old myocardial infarction: Secondary | ICD-10-CM | POA: Insufficient documentation

## 2015-03-23 DIAGNOSIS — I5020 Unspecified systolic (congestive) heart failure: Secondary | ICD-10-CM | POA: Diagnosis not present

## 2015-03-23 DIAGNOSIS — K430 Incisional hernia with obstruction, without gangrene: Secondary | ICD-10-CM | POA: Insufficient documentation

## 2015-03-23 DIAGNOSIS — J9611 Chronic respiratory failure with hypoxia: Secondary | ICD-10-CM | POA: Diagnosis present

## 2015-03-23 DIAGNOSIS — M1990 Unspecified osteoarthritis, unspecified site: Secondary | ICD-10-CM | POA: Diagnosis present

## 2015-03-23 DIAGNOSIS — I2510 Atherosclerotic heart disease of native coronary artery without angina pectoris: Secondary | ICD-10-CM | POA: Diagnosis present

## 2015-03-23 HISTORY — DX: Opioid use, unspecified, uncomplicated: F11.90

## 2015-03-23 HISTORY — PX: LAPAROSCOPIC LYSIS OF ADHESIONS: SHX5905

## 2015-03-23 HISTORY — PX: LAPAROSCOPIC ASSISTED VENTRAL HERNIA REPAIR: SHX6312

## 2015-03-23 SURGERY — REPAIR, HERNIA, VENTRAL, LAPAROSCOPY-ASSISTED
Anesthesia: General

## 2015-03-23 MED ORDER — BUPIVACAINE LIPOSOME 1.3 % IJ SUSP
20.0000 mL | Freq: Once | INTRAMUSCULAR | Status: AC
  Administered 2015-03-23: 20 mL
  Filled 2015-03-23: qty 20

## 2015-03-23 MED ORDER — DIAZEPAM 5 MG PO TABS: 5.0000 mg | ORAL_TABLET | Freq: Three times a day (TID) | ORAL | Status: DC | PRN

## 2015-03-23 MED ORDER — ONDANSETRON HCL 4 MG PO TABS: 4.0000 mg | ORAL_TABLET | Freq: Four times a day (QID) | ORAL | Status: DC | PRN

## 2015-03-23 MED ORDER — SODIUM CHLORIDE 0.9 % IJ SOLN
INTRAMUSCULAR | Status: AC
  Filled 2015-03-23: qty 50

## 2015-03-23 MED ORDER — NEOSTIGMINE METHYLSULFATE 10 MG/10ML IV SOLN
INTRAVENOUS | Status: AC
  Filled 2015-03-23: qty 1

## 2015-03-23 MED ORDER — FENTANYL CITRATE (PF) 250 MCG/5ML IJ SOLN
INTRAMUSCULAR | Status: AC
  Filled 2015-03-23: qty 5

## 2015-03-23 MED ORDER — ROCURONIUM BROMIDE 100 MG/10ML IV SOLN
INTRAVENOUS | Status: DC | PRN
  Administered 2015-03-23: 5 mg via INTRAVENOUS
  Administered 2015-03-23 (×3): 10 mg via INTRAVENOUS
  Administered 2015-03-23: 30 mg via INTRAVENOUS

## 2015-03-23 MED ORDER — BISACODYL 10 MG RE SUPP: 10.0000 mg | Freq: Two times a day (BID) | RECTAL | Status: DC | PRN

## 2015-03-23 MED ORDER — ALUM & MAG HYDROXIDE-SIMETH 200-200-20 MG/5ML PO SUSP: 30.0000 mL | Freq: Four times a day (QID) | ORAL | Status: DC | PRN

## 2015-03-23 MED ORDER — RANOLAZINE ER 500 MG PO TB12: 500.0000 mg | ORAL_TABLET | Freq: Two times a day (BID) | ORAL | Status: DC

## 2015-03-23 MED ORDER — RANOLAZINE ER 500 MG PO TB12
500.0000 mg | ORAL_TABLET | Freq: Two times a day (BID) | ORAL | Status: DC
  Administered 2015-03-23 – 2015-03-24 (×2): 500 mg via ORAL
  Filled 2015-03-23 (×3): qty 1

## 2015-03-23 MED ORDER — HYDROCODONE-ACETAMINOPHEN 10-325 MG PO TABS
1.0000 | ORAL_TABLET | ORAL | Status: DC | PRN
  Administered 2015-03-23 – 2015-03-24 (×2): 2 via ORAL
  Filled 2015-03-23 (×2): qty 2

## 2015-03-23 MED ORDER — ONDANSETRON HCL 4 MG/2ML IJ SOLN: 4.0000 mg | Freq: Four times a day (QID) | INTRAMUSCULAR | Status: DC | PRN

## 2015-03-23 MED ORDER — PHENOL 1.4 % MT LIQD
2.0000 | OROMUCOSAL | Status: DC | PRN
  Administered 2015-03-23: 2 via OROMUCOSAL
  Filled 2015-03-23: qty 177

## 2015-03-23 MED ORDER — MIDAZOLAM HCL 2 MG/2ML IJ SOLN
INTRAMUSCULAR | Status: AC
  Filled 2015-03-23: qty 2

## 2015-03-23 MED ORDER — CEFAZOLIN SODIUM-DEXTROSE 2-3 GM-% IV SOLR
INTRAVENOUS | Status: AC
  Filled 2015-03-23: qty 50

## 2015-03-23 MED ORDER — IPRATROPIUM-ALBUTEROL 18-103 MCG/ACT IN AERO: 2.0000 | INHALATION_SPRAY | Freq: Four times a day (QID) | RESPIRATORY_TRACT | Status: DC | PRN

## 2015-03-23 MED ORDER — NITROGLYCERIN 0.4 MG SL SUBL: 0.4000 mg | SUBLINGUAL_TABLET | SUBLINGUAL | Status: DC | PRN

## 2015-03-23 MED ORDER — DEXAMETHASONE SODIUM PHOSPHATE 10 MG/ML IJ SOLN
INTRAMUSCULAR | Status: AC
  Filled 2015-03-23: qty 1

## 2015-03-23 MED ORDER — FENTANYL CITRATE (PF) 250 MCG/5ML IJ SOLN
INTRAMUSCULAR | Status: DC | PRN
  Administered 2015-03-23: 50 ug via INTRAVENOUS
  Administered 2015-03-23: 100 ug via INTRAVENOUS
  Administered 2015-03-23 (×4): 50 ug via INTRAVENOUS

## 2015-03-23 MED ORDER — PROMETHAZINE HCL 25 MG/ML IJ SOLN: 6.2500 mg | INTRAMUSCULAR | Status: DC | PRN

## 2015-03-23 MED ORDER — METHOCARBAMOL 750 MG PO TABS: 750.0000 mg | ORAL_TABLET | Freq: Four times a day (QID) | ORAL | Status: DC | PRN

## 2015-03-23 MED ORDER — KETOROLAC TROMETHAMINE 30 MG/ML IJ SOLN
INTRAMUSCULAR | Status: AC
  Filled 2015-03-23: qty 1

## 2015-03-23 MED ORDER — HYDROMORPHONE HCL 1 MG/ML IJ SOLN
INTRAMUSCULAR | Status: AC
  Filled 2015-03-23: qty 1

## 2015-03-23 MED ORDER — FLUTICASONE PROPIONATE 50 MCG/ACT NA SUSP
1.0000 | Freq: Every day | NASAL | Status: DC
  Administered 2015-03-23 – 2015-03-24 (×2): 1 via NASAL
  Filled 2015-03-23: qty 16

## 2015-03-23 MED ORDER — SODIUM CHLORIDE 0.9 % IJ SOLN: 3.0000 mL | Freq: Two times a day (BID) | INTRAMUSCULAR | Status: DC

## 2015-03-23 MED ORDER — PANTOPRAZOLE SODIUM 40 MG PO TBEC
40.0000 mg | DELAYED_RELEASE_TABLET | Freq: Every day | ORAL | Status: DC
  Administered 2015-03-23 – 2015-03-24 (×2): 40 mg via ORAL
  Filled 2015-03-23 (×2): qty 1

## 2015-03-23 MED ORDER — SODIUM CHLORIDE 0.9 % IV SOLN: 250.0000 mL | INTRAVENOUS | Status: DC | PRN

## 2015-03-23 MED ORDER — DIPHENHYDRAMINE HCL 50 MG/ML IJ SOLN: 12.5000 mg | Freq: Four times a day (QID) | INTRAMUSCULAR | Status: DC | PRN

## 2015-03-23 MED ORDER — GLYCOPYRROLATE 0.2 MG/ML IJ SOLN
INTRAMUSCULAR | Status: DC | PRN
  Administered 2015-03-23: 0.6 mg via INTRAVENOUS

## 2015-03-23 MED ORDER — LACTATED RINGERS IV SOLN
INTRAVENOUS | Status: DC
  Administered 2015-03-23 – 2015-03-24 (×2): via INTRAVENOUS

## 2015-03-23 MED ORDER — BUPIVACAINE-EPINEPHRINE 0.25% -1:200000 IJ SOLN
INTRAMUSCULAR | Status: AC
  Filled 2015-03-23: qty 1

## 2015-03-23 MED ORDER — POLYETHYLENE GLYCOL 3350 17 G PO PACK
17.0000 g | PACK | Freq: Two times a day (BID) | ORAL | Status: DC
  Administered 2015-03-23 – 2015-03-24 (×2): 17 g via ORAL
  Filled 2015-03-23 (×3): qty 1

## 2015-03-23 MED ORDER — GLYCOPYRROLATE 0.2 MG/ML IJ SOLN
INTRAMUSCULAR | Status: AC
  Filled 2015-03-23: qty 3

## 2015-03-23 MED ORDER — CARVEDILOL 3.125 MG PO TABS
3.1250 mg | ORAL_TABLET | Freq: Two times a day (BID) | ORAL | Status: DC
  Administered 2015-03-23 – 2015-03-24 (×2): 3.125 mg via ORAL
  Filled 2015-03-23 (×4): qty 1

## 2015-03-23 MED ORDER — MENTHOL 3 MG MT LOZG
1.0000 | LOZENGE | OROMUCOSAL | Status: DC | PRN
  Filled 2015-03-23: qty 9

## 2015-03-23 MED ORDER — METHOCARBAMOL 1000 MG/10ML IJ SOLN
1000.0000 mg | Freq: Four times a day (QID) | INTRAVENOUS | Status: DC | PRN
  Administered 2015-03-23: 1000 mg via INTRAVENOUS
  Filled 2015-03-23 (×2): qty 10

## 2015-03-23 MED ORDER — MAGIC MOUTHWASH
15.0000 mL | Freq: Four times a day (QID) | ORAL | Status: DC | PRN
  Filled 2015-03-23: qty 15

## 2015-03-23 MED ORDER — DEXAMETHASONE SODIUM PHOSPHATE 10 MG/ML IJ SOLN
INTRAMUSCULAR | Status: DC | PRN
  Administered 2015-03-23: 10 mg via INTRAVENOUS

## 2015-03-23 MED ORDER — ASPIRIN EC 81 MG PO TBEC
81.0000 mg | DELAYED_RELEASE_TABLET | Freq: Every day | ORAL | Status: DC
  Administered 2015-03-23 – 2015-03-24 (×2): 81 mg via ORAL
  Filled 2015-03-23 (×2): qty 1

## 2015-03-23 MED ORDER — ISOSORBIDE MONONITRATE ER 30 MG PO TB24
30.0000 mg | ORAL_TABLET | Freq: Every day | ORAL | Status: DC
  Administered 2015-03-23 – 2015-03-24 (×2): 30 mg via ORAL
  Filled 2015-03-23 (×2): qty 1

## 2015-03-23 MED ORDER — ROSUVASTATIN CALCIUM 10 MG PO TABS
10.0000 mg | ORAL_TABLET | Freq: Every day | ORAL | Status: DC
  Administered 2015-03-23 – 2015-03-24 (×2): 10 mg via ORAL
  Filled 2015-03-23 (×2): qty 1

## 2015-03-23 MED ORDER — ROCURONIUM BROMIDE 100 MG/10ML IV SOLN
INTRAVENOUS | Status: AC
  Filled 2015-03-23: qty 1

## 2015-03-23 MED ORDER — PROPOFOL 10 MG/ML IV BOLUS
INTRAVENOUS | Status: AC
  Filled 2015-03-23: qty 20

## 2015-03-23 MED ORDER — ONDANSETRON HCL 4 MG/2ML IJ SOLN
INTRAMUSCULAR | Status: AC
  Filled 2015-03-23: qty 2

## 2015-03-23 MED ORDER — HYDROCODONE-ACETAMINOPHEN 10-325 MG PO TABS: 1.0000 | ORAL_TABLET | Freq: Four times a day (QID) | ORAL | Status: DC | PRN

## 2015-03-23 MED ORDER — LACTATED RINGERS IV BOLUS (SEPSIS): 1000.0000 mL | Freq: Three times a day (TID) | INTRAVENOUS | Status: DC | PRN

## 2015-03-23 MED ORDER — 0.9 % SODIUM CHLORIDE (POUR BTL) OPTIME
TOPICAL | Status: DC | PRN
  Administered 2015-03-23: 1000 mL

## 2015-03-23 MED ORDER — LISINOPRIL 10 MG PO TABS
10.0000 mg | ORAL_TABLET | Freq: Every day | ORAL | Status: DC
  Administered 2015-03-23 – 2015-03-24 (×2): 10 mg via ORAL
  Filled 2015-03-23 (×2): qty 1

## 2015-03-23 MED ORDER — ASPIRIN 81 MG PO TABS: 81.0000 mg | ORAL_TABLET | Freq: Every day | ORAL | Status: DC

## 2015-03-23 MED ORDER — POLYETHYLENE GLYCOL 3350 17 G PO PACK: 17.0000 g | PACK | Freq: Two times a day (BID) | ORAL | Status: DC | PRN

## 2015-03-23 MED ORDER — MEPERIDINE HCL 50 MG/ML IJ SOLN: 6.2500 mg | INTRAMUSCULAR | Status: DC | PRN

## 2015-03-23 MED ORDER — SODIUM CHLORIDE 0.9 % IJ SOLN
INTRAMUSCULAR | Status: DC | PRN
  Administered 2015-03-23: 80 mL via INTRAVENOUS

## 2015-03-23 MED ORDER — PROPOFOL 10 MG/ML IV BOLUS
INTRAVENOUS | Status: DC | PRN
  Administered 2015-03-23: 150 mg via INTRAVENOUS

## 2015-03-23 MED ORDER — LIP MEDEX EX OINT
1.0000 "application " | TOPICAL_OINTMENT | Freq: Two times a day (BID) | CUTANEOUS | Status: DC
  Administered 2015-03-23 – 2015-03-24 (×2): 1 via TOPICAL
  Filled 2015-03-23: qty 7

## 2015-03-23 MED ORDER — FENTANYL 75 MCG/HR TD PT72
75.0000 ug | MEDICATED_PATCH | TRANSDERMAL | Status: DC
  Administered 2015-03-23: 75 ug via TRANSDERMAL
  Filled 2015-03-23: qty 1

## 2015-03-23 MED ORDER — MOMETASONE FURO-FORMOTEROL FUM 100-5 MCG/ACT IN AERO
2.0000 | INHALATION_SPRAY | Freq: Two times a day (BID) | RESPIRATORY_TRACT | Status: DC
  Administered 2015-03-23 – 2015-03-24 (×2): 2 via RESPIRATORY_TRACT
  Filled 2015-03-23: qty 8.8

## 2015-03-23 MED ORDER — SUCCINYLCHOLINE CHLORIDE 20 MG/ML IJ SOLN
INTRAMUSCULAR | Status: DC | PRN
  Administered 2015-03-23: 100 mg via INTRAVENOUS

## 2015-03-23 MED ORDER — CEFAZOLIN SODIUM-DEXTROSE 2-3 GM-% IV SOLR
2.0000 g | INTRAVENOUS | Status: AC
  Administered 2015-03-23: 2 g via INTRAVENOUS

## 2015-03-23 MED ORDER — SODIUM CHLORIDE 0.9 % IJ SOLN: 3.0000 mL | INTRAMUSCULAR | Status: DC | PRN

## 2015-03-23 MED ORDER — HEPARIN SODIUM (PORCINE) 5000 UNIT/ML IJ SOLN
5000.0000 [IU] | Freq: Three times a day (TID) | INTRAMUSCULAR | Status: DC
  Administered 2015-03-23 – 2015-03-24 (×2): 5000 [IU] via SUBCUTANEOUS
  Filled 2015-03-23 (×5): qty 1

## 2015-03-23 MED ORDER — IPRATROPIUM BROMIDE 0.02 % IN SOLN: 0.5000 mg | RESPIRATORY_TRACT | Status: DC | PRN

## 2015-03-23 MED ORDER — ONDANSETRON HCL 4 MG/2ML IJ SOLN
4.0000 mg | Freq: Once | INTRAMUSCULAR | Status: AC | PRN
  Administered 2015-03-23: 4 mg via INTRAVENOUS

## 2015-03-23 MED ORDER — IPRATROPIUM-ALBUTEROL 0.5-2.5 (3) MG/3ML IN SOLN: 3.0000 mL | Freq: Four times a day (QID) | RESPIRATORY_TRACT | Status: DC | PRN

## 2015-03-23 MED ORDER — ONDANSETRON HCL 4 MG/2ML IJ SOLN
INTRAMUSCULAR | Status: DC | PRN
  Administered 2015-03-23: 4 mg via INTRAVENOUS

## 2015-03-23 MED ORDER — HYDROMORPHONE HCL 1 MG/ML IJ SOLN
0.2500 mg | INTRAMUSCULAR | Status: DC | PRN
  Administered 2015-03-23 (×4): 0.5 mg via INTRAVENOUS

## 2015-03-23 MED ORDER — KETOROLAC TROMETHAMINE 30 MG/ML IJ SOLN
INTRAMUSCULAR | Status: DC | PRN
  Administered 2015-03-23: 30 mg via INTRAVENOUS

## 2015-03-23 MED ORDER — HYDROMORPHONE HCL 1 MG/ML IJ SOLN
0.5000 mg | INTRAMUSCULAR | Status: DC | PRN
  Administered 2015-03-23 – 2015-03-24 (×2): 1 mg via INTRAVENOUS
  Administered 2015-03-24: 2 mg via INTRAVENOUS
  Filled 2015-03-23: qty 2
  Filled 2015-03-23 (×2): qty 1

## 2015-03-23 MED ORDER — BUPIVACAINE-EPINEPHRINE (PF) 0.25% -1:200000 IJ SOLN
INTRAMUSCULAR | Status: AC
  Filled 2015-03-23: qty 30

## 2015-03-23 MED ORDER — IBUPROFEN 200 MG PO TABS: 400.0000 mg | ORAL_TABLET | Freq: Four times a day (QID) | ORAL | Status: DC | PRN

## 2015-03-23 MED ORDER — MIDAZOLAM HCL 5 MG/5ML IJ SOLN
INTRAMUSCULAR | Status: DC | PRN
  Administered 2015-03-23: 2 mg via INTRAVENOUS

## 2015-03-23 MED ORDER — BUPIVACAINE-EPINEPHRINE 0.25% -1:200000 IJ SOLN
INTRAMUSCULAR | Status: DC | PRN
  Administered 2015-03-23: 60 mL

## 2015-03-23 MED ORDER — LACTATED RINGERS IV SOLN
INTRAVENOUS | Status: DC
  Administered 2015-03-23 (×2): via INTRAVENOUS
  Administered 2015-03-23: 1000 mL via INTRAVENOUS

## 2015-03-23 MED ORDER — NEOSTIGMINE METHYLSULFATE 10 MG/10ML IV SOLN
INTRAVENOUS | Status: DC | PRN
  Administered 2015-03-23: 4 mg via INTRAVENOUS

## 2015-03-23 MED ORDER — CEFAZOLIN SODIUM 1-5 GM-% IV SOLN
1.0000 g | Freq: Three times a day (TID) | INTRAVENOUS | Status: AC
  Administered 2015-03-23: 1 g via INTRAVENOUS
  Filled 2015-03-23: qty 50

## 2015-03-23 MED ORDER — POTASSIUM CHLORIDE CRYS ER 20 MEQ PO TBCR
20.0000 meq | EXTENDED_RELEASE_TABLET | Freq: Every day | ORAL | Status: DC
  Administered 2015-03-23 – 2015-03-24 (×2): 20 meq via ORAL
  Filled 2015-03-23 (×2): qty 1

## 2015-03-23 MED ORDER — STERILE WATER FOR IRRIGATION IR SOLN
Status: DC | PRN
  Administered 2015-03-23: 1500 mL

## 2015-03-23 SURGICAL SUPPLY — 45 items
APL SKNCLS STERI-STRIP NONHPOA (GAUZE/BANDAGES/DRESSINGS) ×1
APPLIER CLIP 5 13 M/L LIGAMAX5 (MISCELLANEOUS)
APR CLP MED LRG 5 ANG JAW (MISCELLANEOUS)
BENZOIN TINCTURE PRP APPL 2/3 (GAUZE/BANDAGES/DRESSINGS) ×1 IMPLANT
BINDER ABDOMINAL 12 ML 46-62 (SOFTGOODS) ×1 IMPLANT
CABLE HIGH FREQUENCY MONO STRZ (ELECTRODE) ×2 IMPLANT
CATH KIT ON-Q SILVERSOAK 7.5 (CATHETERS) IMPLANT
CATH KIT ON-Q SILVERSOAK 7.5IN (CATHETERS) IMPLANT
CHLORAPREP W/TINT 26ML (MISCELLANEOUS) ×2 IMPLANT
CLIP APPLIE 5 13 M/L LIGAMAX5 (MISCELLANEOUS) IMPLANT
COVER SURGICAL LIGHT HANDLE (MISCELLANEOUS) ×1 IMPLANT
DECANTER SPIKE VIAL GLASS SM (MISCELLANEOUS) ×2 IMPLANT
DEVICE SECURE STRAP 25 ABSORB (INSTRUMENTS) ×1 IMPLANT
DEVICE TROCAR PUNCTURE CLOSURE (ENDOMECHANICALS) ×2 IMPLANT
DRAPE LAPAROSCOPIC ABDOMINAL (DRAPES) ×2 IMPLANT
DRAPE WARM FLUID 44X44 (DRAPE) ×2 IMPLANT
DRSG TEGADERM 2-3/8X2-3/4 SM (GAUZE/BANDAGES/DRESSINGS) ×4 IMPLANT
DRSG TEGADERM 4X4.75 (GAUZE/BANDAGES/DRESSINGS) ×1 IMPLANT
ELECT REM PT RETURN 9FT ADLT (ELECTROSURGICAL) ×2
ELECTRODE REM PT RTRN 9FT ADLT (ELECTROSURGICAL) ×1 IMPLANT
GAUZE SPONGE 2X2 8PLY STRL LF (GAUZE/BANDAGES/DRESSINGS) IMPLANT
GLOVE ECLIPSE 8.0 STRL XLNG CF (GLOVE) ×2 IMPLANT
GLOVE INDICATOR 8.0 STRL GRN (GLOVE) ×2 IMPLANT
GOWN STRL REUS W/TWL XL LVL3 (GOWN DISPOSABLE) ×5 IMPLANT
KIT BASIN OR (CUSTOM PROCEDURE TRAY) ×2 IMPLANT
MESH VENTRALIGHT ST 10X13IN (Mesh General) ×1 IMPLANT
NDL SPNL 22GX3.5 QUINCKE BK (NEEDLE) IMPLANT
NEEDLE SPNL 22GX3.5 QUINCKE BK (NEEDLE) ×2 IMPLANT
PEN SKIN MARKING BROAD (MISCELLANEOUS) ×2 IMPLANT
SCISSORS LAP 5X35 DISP (ENDOMECHANICALS) ×2 IMPLANT
SET IRRIG TUBING LAPAROSCOPIC (IRRIGATION / IRRIGATOR) IMPLANT
SHEARS HARMONIC ACE PLUS 36CM (ENDOMECHANICALS) IMPLANT
SLEEVE SURGEON STRL (DRAPES) ×1 IMPLANT
SLEEVE XCEL OPT CAN 5 100 (ENDOMECHANICALS) ×4 IMPLANT
SPONGE GAUZE 2X2 STER 10/PKG (GAUZE/BANDAGES/DRESSINGS) ×1
STRIP CLOSURE SKIN 1/2X4 (GAUZE/BANDAGES/DRESSINGS) ×3 IMPLANT
SUT MNCRL AB 4-0 PS2 18 (SUTURE) ×2 IMPLANT
SUT PDS AB 1 CT1 27 (SUTURE) ×2 IMPLANT
SUT PROLENE 1 CT 1 30 (SUTURE) ×10 IMPLANT
TOWEL OR 17X26 10 PK STRL BLUE (TOWEL DISPOSABLE) ×2 IMPLANT
TRAY FOLEY W/METER SILVER 14FR (SET/KITS/TRAYS/PACK) ×1 IMPLANT
TRAY LAPAROSCOPIC (CUSTOM PROCEDURE TRAY) ×2 IMPLANT
TROCAR BLADELESS OPT 5 100 (ENDOMECHANICALS) ×2 IMPLANT
TROCAR XCEL NON-BLD 11X100MML (ENDOMECHANICALS) IMPLANT
TUNNELER SHEATH ON-Q 16GX12 DP (PAIN MANAGEMENT) IMPLANT

## 2015-03-23 NOTE — Anesthesia Procedure Notes (Signed)
Procedure Name: Intubation Date/Time: 03/23/2015 12:32 PM Performed by: Dione Booze Pre-anesthesia Checklist: Emergency Drugs available, Patient identified, Suction available and Patient being monitored Patient Re-evaluated:Patient Re-evaluated prior to inductionOxygen Delivery Method: Circle system utilized Preoxygenation: Pre-oxygenation with 100% oxygen Intubation Type: IV induction Laryngoscope Size: Mac and 4 Grade View: Grade II Tube type: Oral Tube size: 7.5 mm Number of attempts: 1 Airway Equipment and Method: Stylet Placement Confirmation: ETT inserted through vocal cords under direct vision,  breath sounds checked- equal and bilateral and positive ETCO2 Secured at: 21 cm Tube secured with: Tape Dental Injury: Teeth and Oropharynx as per pre-operative assessment

## 2015-03-23 NOTE — Transfer of Care (Signed)
Immediate Anesthesia Transfer of Care Note  Patient: [name]  Procedure(s) Performed: Procedure(s): LAPAROSCOPIC VENTRAL WALL HERNIA REPAIR (N/A) LAPAROSCOPIC LYSIS OF ADHESIONS (N/A)  Patient Location: PACU  Anesthesia Type:General  Level of Consciousness: awake, alert , oriented and patient cooperative  Airway & Oxygen Therapy: Patient Spontanous Breathing and Patient connected to face mask oxygen  Post-op Assessment: Report given to RN and Post -op Vital signs reviewed and stable  Post vital signs: Reviewed and stable  Last Vitals:  Filed Vitals:   03/23/15 1020  BP: 117/68  Pulse: 70  Temp: 36.6 C  Resp: 18    Complications: No apparent anesthesia complications

## 2015-03-23 NOTE — H&P (Signed)
[place]  [address] [place], [place] [id number] Phone: [phone] FAX: [phone]     [name]  18-Jan-1956 [id number]  CARE TEAM:  PCP: Lillard Anes, MD  Outpatient Care Team: Patient Care Team: Lillard Anes, MD as PCP - General (Family Medicine) Newman Pies, MD as Consulting Physician (Neurosurgery) Park Liter, MD as Consulting Physician (Cardiology)  Inpatient Treatment Team: Treatment Team: Attending Provider: Michael Boston, MD  This patient is a 59 y.o.female who presents today for surgical evaluation  Reason for evaluation: Hernia  Pleasant woman with chronic abdominal and back pain on chronic narcotics. History of cholecystectomy and appendectomy. Had an episode of bowel obstruction requiring open lysis of adhesions 4 years ago -- girl. She tells me that she had a heart attack on the table. Stop smoking. Can walk a few blocks before her crampy back and abdominal pain bothers her.  She has been having worsening nausea. Has chronic heartburn and reflux. Mostly stable on proton pump inhibitor. Takes intermittent Zofran for nausea. She has noted some increase abdominal bulging and pain especially with activity. Incisional hernia felt. Concerned. Surgical consultation requested.  She describes episodes of severe sharp episodes of nausea and vomiting. Usually happens before she can reach for the Zofran medication. Has a bowel movement every other day with the help of medications. She is concerned that the hernia is causing blockages and can. He wishes to consider surgery. She was told that she was an increased operative risk and I did not want to do her in [place]. Therefore second opinion requested in [place].  I saw her last summer.  I recommended surgical repair.  I cautioned against hernia repair solving all problems especially in light of her chronic narcotic use  and chronic pain.  Took some time to get cardiac clearance and for her to be amenable to considering surgery.  She is ready this year.  Cleared by cardiology.  Past Medical History  Diagnosis Date  . NICM (nonischemic cardiomyopathy)   . HLD (hyperlipidemia)   . Chronic back pain   . PVCs (premature ventricular contractions)   . Heart failure   . Left ventricular systolic dysfunction   . CHF (congestive heart failure)   . ICD (implantable cardiac defibrillator) in place 2012  . GERD (gastroesophageal reflux disease)   . Arthritis   . COPD (chronic obstructive pulmonary disease)   . AICD (automatic cardioverter/defibrillator) present 2012   . Asthma   . Myocardial infarction     2011 during intestinal blockage surgery    Past Surgical History  Procedure Laterality Date  . Neck surgery      fused  . Carpal tunnel release    . Hand surgery    . Appendectomy    . Tonsillectomy    . Cesarean section    . Cholecystectomy    . Intestinal blockage 2011    . Ulnar nerve transposition  01/23/2012    Procedure: ULNAR NERVE DECOMPRESSION/TRANSPOSITION;  Surgeon: Ophelia Charter, MD;  Location: [facility];  Service: Neurosurgery;  Laterality: Left;  LEFT ulnar nerve decompression    History   Social History  . Marital Status: Single    Spouse Name: N/A  . Number of Children: 3  . Years of Education: N/A   Occupational History  . disabled    Social History Main Topics  . Smoking status: Former Smoker -- 1.00 packs/day for 30 years    Types: Cigarettes  Quit date: 08/11/2010  . Smokeless tobacco: Never Used  . Alcohol Use: No  . Drug Use: No  . Sexual Activity: Not Currently   Other Topics Concern  . Not on file   Social History Narrative    Family History  Problem Relation Age of Onset  . Heart attack    . Cancer    . Heart failure    . Anesthesia problems Neg Hx   . Hypotension Neg Hx   . Malignant hyperthermia Neg Hx   . Pseudochol deficiency Neg Hx      Current Facility-Administered Medications  Medication Dose Route Frequency Provider Last Rate Last Dose  . ceFAZolin (ANCEF) IVPB 2 g/50 mL premix  2 g Intravenous 60 min Pre-Op Michael Boston, MD      . lactated ringers infusion   Intravenous Continuous Lillia Abed, MD 125 mL/hr at 03/23/15 1109 1,000 mL at 03/23/15 1109     No Known Allergies  ROS: Constitutional:  No fevers, chills, sweats.  Weight stable Eyes:  No vision changes, No discharge HENT:  No sore throats, nasal drainage Lymph: No neck swelling, No bruising easily Pulmonary:  No cough, productive sputum CV: No orthopnea, PND   No exertional chest/neck/shoulder/arm pain. GI:  No personal nor family history of GI/colon cancer, inflammatory bowel disease, irritable bowel syndrome, allergy such as Celiac Sprue, dietary/dairy problems, colitis, ulcers nor gastritis.  No recent sick contacts/gastroenteritis.  No travel outside the country.  No changes in diet. Renal: No UTIs, No hematuria Genital:  No drainage, bleeding, masses Musculoskeletal: No severe joint pain.  Good ROM major joints Skin:  No sores or lesions.  No rashes Heme/Lymph:  No easy bleeding.  No swollen lymph nodes Neuro: No focal weakness/numbness.  No seizures Psych: No suicidal ideation.  No hallucinations  BP 117/68 mmHg  Pulse 70  Temp(Src) 97.8 F (36.6 C) (Oral)  Resp 18  SpO2 99%  Physical Exam: Constitutional: She is oriented to person, place, and time. She appears well-developed and well-nourished. No distress.  HENT:  Head: Normocephalic.  Mouth/Throat: Oropharynx is clear and moist. No oropharyngeal exudate.  Eyes: Conjunctivae and EOM are normal. Pupils are equal, round, and reactive to light. No scleral icterus.  Neck: Normal range of motion. Neck supple. No tracheal deviation present.  Cardiovascular: Normal rate, regular rhythm and intact distal pulses.  Pulmonary/Chest: Effort normal and breath sounds normal. No stridor. No  respiratory distress. She exhibits no tenderness.  Abdominal: Soft. She exhibits no distension and no mass. There is tenderness in the epigastric area and periumbilical area. There is no rigidity, no rebound, no guarding, no tenderness at McBurney's point and negative Murphy's sign. A hernia is present. Hernia confirmed positive in the ventral area. Hernia confirmed negative in the right inguinal area and confirmed negative in the left inguinal area.    Genitourinary: No vaginal discharge found.  Musculoskeletal: Normal range of motion. She exhibits no tenderness.   Right elbow: She exhibits normal range of motion.   Left elbow: She exhibits normal range of motion.   Right wrist: She exhibits normal range of motion.   Left wrist: She exhibits normal range of motion.   Right hand: Normal strength noted.   Left hand: Normal strength noted.  Lymphadenopathy:   Head (right side): No posterior auricular adenopathy present.   Head (left side): No posterior auricular adenopathy present.   She has no cervical adenopathy.   She has no axillary adenopathy.   Right: No inguinal adenopathy present.  Left: No inguinal adenopathy present.  Neurological: She is alert and oriented to person, place, and time. No cranial nerve deficit. She exhibits normal muscle tone. Coordination normal.  Skin: Skin is warm and dry. No rash noted. She is not diaphoretic. No erythema.  Psychiatric: She has a normal mood and affect. Her behavior is normal. Judgment and thought content normal.   Results:   Labs: No results found for this or any previous visit (from the past 48 hour(s)).  Imaging / Studies: No results found.  Medications / Allergies: per chart  Antibiotics: Anti-infectives    Start     Dose/Rate Route Frequency Ordered Stop   03/23/15 1109  ceFAZolin (ANCEF) IVPB 2 g/50 mL premix     2 g 100 mL/hr over 30 Minutes Intravenous 60 min pre-op 03/23/15 1109         Assessment  [name]  59 y.o. female  Day of Surgery  Procedure(s): LAPAROSCOPIC VENTRAL WALL HERNIA REPAIR LAPAROSCOPIC LYSIS OF ADHESIONS  Problem List:  Active Problems:   * No active hospital problems. *   Ventral incisional hernia incarcerated with omentum. Probable contributes to abdominal pain but unlikely source of nausea and vomiting.  Chronic constipation with moderate stool burden seen on last CAT scan.  Cardiomyopathy and COPD.     Plan:  Lap LOA & VWH repair:  The anatomy & physiology of the abdominal wall was discussed.  The pathophysiology of hernias was discussed.  Natural history risks without surgery including progeressive enlargement, pain, incarceration, & strangulation was discussed.   Contributors to complications such as smoking, obesity, diabetes, prior surgery, etc were discussed.   I feel the risks of no intervention will lead to serious problems that outweigh the operative risks; therefore, I recommended surgery to reduce and repair the hernia.  I explained laparoscopic techniques with possible need for an open approach.  I noted the probable use of mesh to patch and/or buttress the hernia repair  Risks such as bleeding, infection, abscess, need for further treatment, stroke, heart attack, death, and other risks were discussed.  I noted a good likelihood this will help address the problem.   Goals of post-operative recovery were discussed as well.  Possibility that this will not correct all symptoms was explained.  I stressed the importance of low-impact activity, aggressive pain control, avoiding constipation, & not pushing through pain to minimize risk of post-operative chronic pain or injury. Possibility of reherniation especially with smoking, obesity, diabetes, immunosuppression, and other health conditions was discussed.  We will work to minimize complications.     An educational handout further explaining the pathology & treatment options  was given as well.  Questions were answered.  The patient expresses understanding & wishes to proceed with surgery.     -VTE prophylaxis- SCDs, etc -mobilize as tolerated to help recovery    Adin Hector, M.D., F.A.C.S. Gastrointestinal and Minimally Invasive Surgery [facility] [address], [place], [place] [address] [phone] Main / Paging   03/23/2015  Note: Portions of this report may have been transcribed using voice recognition software. Every effort was made to ensure accuracy; however, inadvertent computerized transcription errors may be present.   Any transcriptional errors that result from this process are unintentional.

## 2015-03-23 NOTE — Discharge Instructions (Signed)
HERNIA REPAIR: POST OP INSTRUCTIONS ° °1. DIET: Follow a light bland diet the first 24 hours after arrival home, such as soup, liquids, crackers, etc.  Be sure to include lots of fluids daily.  Avoid fast food or heavy meals as your are more likely to get nauseated.  Eat a low fat the next few days after surgery. °2. Take your usually prescribed home medications unless otherwise directed. °3. PAIN CONTROL: °a. Pain is best controlled by a usual combination of three different methods TOGETHER: °i. Ice/Heat °ii. Over the counter pain medication °iii. Prescription pain medication °b. Most patients will experience some swelling and bruising around the hernia(s) such as the bellybutton, groins, or old incisions.  Ice packs or heating pads (30-60 minutes up to 6 times a day) will help. Use ice for the first few days to help decrease swelling and bruising, then switch to heat to help relax tight/sore spots and speed recovery.  Some people prefer to use ice alone, heat alone, alternating between ice & heat.  Experiment to what works for you.  Swelling and bruising can take several weeks to resolve.   °c. It is helpful to take an over-the-counter pain medication regularly for the first few weeks.  Choose one of the following that works best for you: °i. Naproxen (Aleve, etc)  Two 220mg tabs twice a day °ii. Ibuprofen (Advil, etc) Three 200mg tabs four times a day (every meal & bedtime) °iii. Acetaminophen (Tylenol, etc) 325-650mg four times a day (every meal & bedtime) °d. A  prescription for pain medication should be given to you upon discharge.  Take your pain medication as prescribed.  °i. If you are having problems/concerns with the prescription medicine (does not control pain, nausea, vomiting, rash, itching, etc), please call us [phone] to see if we need to switch you to a different pain medicine that will work better for you and/or control your side effect better. °ii. If you need a refill on your pain  medication, please contact your pharmacy.  They will contact our office to request authorization. Prescriptions will not be filled after 5 pm or on week-ends. °4. Avoid getting constipated.  Between the surgery and the pain medications, it is common to experience some constipation.  Increasing fluid intake and taking a fiber supplement (such as Metamucil, Citrucel, FiberCon, MiraLax, etc) 1-2 times a day regularly will usually help prevent this problem from occurring.  A mild laxative (prune juice, Milk of Magnesia, MiraLax, etc) should be taken according to package directions if there are no bowel movements after 48 hours.   °5. Wash / shower every day.  You may shower over the dressings as they are waterproof.   °6. Remove your waterproof bandages 5 days after surgery.  You may leave the incision open to air.  You may replace a dressing/Band-Aid to cover the incision for comfort if you wish.  Continue to shower over incision(s) after the dressing is off. ° ° ° °7. ACTIVITIES as tolerated:   °a. You may resume regular (light) daily activities beginning the next day--such as daily self-care, walking, climbing stairs--gradually increasing activities as tolerated.  If you can walk 30 minutes without difficulty, it is safe to try more intense activity such as jogging, treadmill, bicycling, low-impact aerobics, swimming, etc. °b. Save the most intensive and strenuous activity for last such as sit-ups, heavy lifting, contact sports, etc  Refrain from any heavy lifting or straining until you are off narcotics for pain control.   °  c. DO NOT PUSH THROUGH PAIN.  Let pain be your guide: If it hurts to do something, don't do it.  Pain is your body warning you to avoid that activity for another week until the pain goes down. °d. You may drive when you are no longer taking prescription pain medication, you can comfortably wear a seatbelt, and you can safely maneuver your car and apply brakes. °e. You may have sexual intercourse  when it is comfortable.  °8. FOLLOW UP in our office °a. Please call [facility] at [phone] to set up an appointment to see your surgeon in the office for a follow-up appointment approximately 2-3 weeks after your surgery. °b. Make sure that you call for this appointment the day you arrive home to insure a convenient appointment time. °9.  IF YOU HAVE DISABILITY OR FAMILY LEAVE FORMS, BRING THEM TO THE OFFICE FOR PROCESSING.  DO NOT GIVE THEM TO YOUR DOCTOR. ° °WHEN TO CALL US [phone]: °1. Poor pain control °2. Reactions / problems with new medications (rash/itching, nausea, etc)  °3. Fever over 101.5 F (38.5 C) °4. Inability to urinate °5. Nausea and/or vomiting °6. Worsening swelling or bruising °7. Continued bleeding from incision. °8. Increased pain, redness, or drainage from the incision ° ° The clinic staff is available to answer your questions during regular business hours (8:30am-5pm).  Please don’t hesitate to call and ask to speak to one of our nurses for clinical concerns.  ° If you have a medical emergency, go to the nearest emergency room or call 911. ° A surgeon from [facility] is always on call at the hospitals in [place] ° °[facility] °[address], [address], [place], [place]  [address] ? ° [address] [address], [place], [place]   [address] °MAIN: [phone] ? TOLL FREE: [phone] ? FAX: [phone] °[email or web address] ° °Managing Pain ° °Pain after surgery or related to activity is often due to strain/injury to muscle, tendon, nerves and/or incisions.  This pain is usually short-term and will improve in a few months.  ° °Many people find it helpful to do the following things TOGETHER to help speed the process of healing and to get back to regular activity more quickly: ° °1. Avoid heavy physical activity at first °a. No lifting greater than 20 pounds at first, then increase to lifting as tolerated over the next few weeks °b. Do not “push  through” the pain.  Listen to your body and avoid positions and maneuvers than reproduce the pain.  Wait a few days before trying something more intense °c. Walking is okay as tolerated, but go slowly and stop when getting sore.  If you can walk 30 minutes without stopping or pain, you can try more intense activity (running, jogging, aerobics, cycling, swimming, treadmill, sex, sports, weightlifting, etc ) °d. Remember: If it hurts to do it, then don’t do it! ° °2. Take Anti-inflammatory medication °a. Choose ONE of the following over-the-counter medications: °i.            Acetaminophen 500mg tabs (Tylenol) 1-2 pills with every meal and just before bedtime (avoid if you have liver problems) °ii.            Naproxen 220mg tabs (ex. Aleve) 1-2 pills twice a day (avoid if you have kidney, stomach, IBD, or bleeding problems) °iii. Ibuprofen 200mg tabs (ex. Advil, Motrin) 3-4 pills with every meal and just before bedtime (avoid if you have kidney, stomach, IBD, or bleeding   problems) °b. Take with food/snack around the clock for 1-2 weeks °i. This helps the muscle and nerve tissues become less irritable and calm down faster ° °3. Use a Heating pad or Ice/Cold Pack °a. 4-6 times a day °b. May use warm bath/hottub  or showers ° °4. Try Gentle Massage and/or Stretching  °a. at the area of pain many times a day °b. stop if you feel pain - do not overdo it ° °Try these steps together to help you body heal faster and avoid making things get worse.  Doing just one of these things may not be enough.   ° °If you are not getting better after two weeks or are noticing you are getting worse, contact our office for further advice; we may need to re-evaluate you & see what other things we can do to help. ° °GETTING TO GOOD BOWEL HEALTH. °Irregular bowel habits such as constipation and diarrhea can lead to many problems over time.  Having one soft bowel movement a day is the most important way to prevent further problems.  The  anorectal canal is designed to handle stretching and feces to safely manage our ability to get rid of solid waste (feces, poop, stool) out of our body.  BUT, hard constipated stools can act like ripping concrete bricks and diarrhea can be a burning fire to this very sensitive area of our body, causing inflamed hemorrhoids, anal fissures, increasing risk is perirectal abscesses, abdominal pain/bloating, an making irritable bowel worse.     ° °The goal: ONE SOFT BOWEL MOVEMENT A DAY!  To have soft, regular bowel movements:  °• Drink plenty of fluids, consider 4-6 tall glasses of water a day.   °• Take plenty of fiber.  Fiber is the undigested part of plant food that passes into the colon, acting s “natures broom” to encourage bowel motility and movement.  Fiber can absorb and hold large amounts of water. This results in a larger, bulkier stool, which is soft and easier to pass. Work gradually over several weeks up to 6 servings a day of fiber (25g a day even more if needed) in the form of: °o Vegetables -- Root (potatoes, carrots, turnips), leafy green (lettuce, salad greens, celery, spinach), or cooked high residue (cabbage, broccoli, etc) °o Fruit -- Fresh (unpeeled skin & pulp), Dried (prunes, apricots, cherries, etc ),  or stewed ( applesauce)  °o Whole grain breads, pasta, etc (whole wheat)  °o Bran cereals  °• Bulking Agents -- This type of water-retaining fiber generally is easily obtained each day by one of the following:  °o Psyllium bran -- The psyllium plant is remarkable because its ground seeds can retain so much water. This product is available as Metamucil, Konsyl, Effersyllium, Per Diem Fiber, or the less expensive generic preparation in drug and health food stores. Although labeled a laxative, it really is not a laxative.  °o Methylcellulose -- This is another fiber derived from wood which also retains water. It is available as Citrucel. °o Polyethylene Glycol - and “artificial” fiber commonly called  Miralax or Glycolax.  It is helpful for people with gassy or bloated feelings with regular fiber °o Flax Seed - a less gassy fiber than psyllium °• No reading or other relaxing activity while on the toilet. If bowel movements take longer than 5 minutes, you are too constipated °• AVOID CONSTIPATION.  High fiber and water intake usually takes care of this.  Sometimes a laxative is needed to stimulate more frequent   bowel movements, but   Laxatives are not a good long-term solution as it can wear the colon out.  They can help jump-start bowels if constipated, but should be relied on constantly without discussing with your doctor o Osmotics (Milk of Magnesia, Fleets phosphosoda, Magnesium citrate, MiraLax, GoLytely) are safer than  o Stimulants (Senokot, Castor Oil, Dulcolax, Ex Lax)    o Avoid taking laxatives for more than 7 days in a row.   IF SEVERELY CONSTIPATED, try a Bowel Retraining Program: o Do not use laxatives.  o Eat a diet high in roughage, such as bran cereals and leafy vegetables.  o Drink six (6) ounces of prune or apricot juice each morning.  o Eat two (2) large servings of stewed fruit each day.  o Take one (1) heaping tablespoon of a psyllium-based bulking agent twice a day. Use sugar-free sweetener when possible to avoid excessive calories.  o Eat a normal breakfast.  o Set aside 15 minutes after breakfast to sit on the toilet, but do not strain to have a bowel movement.  o If you do not have a bowel movement by the third day, use an enema and repeat the above steps.   Controlling diarrhea o Switch to liquids and simpler foods for a few days to avoid stressing your intestines further. o Avoid dairy products (especially milk & ice cream) for a short time.  The intestines often can lose the ability to digest lactose when stressed. o Avoid foods that cause gassiness or bloating.  Typical foods include beans and other legumes, cabbage, broccoli, and dairy foods.  Every person has  some sensitivity to other foods, so listen to our body and avoid those foods that trigger problems for you. o Adding fiber (Citrucel, Metamucil, psyllium, Miralax) gradually can help thicken stools by absorbing excess fluid and retrain the intestines to act more normally.  Slowly increase the dose over a few weeks.  Too much fiber too soon can backfire and cause cramping & bloating. o Probiotics (such as active yogurt, Align, etc) may help repopulate the intestines and colon with normal bacteria and calm down a sensitive digestive tract.  Most studies show it to be of mild help, though, and such products can be costly. o Medicines: - Bismuth subsalicylate (ex. Kayopectate, Pepto Bismol) every 30 minutes for up to 6 doses can help control diarrhea.  Avoid if pregnant. - Loperamide (Immodium) can slow down diarrhea.  Start with two tablets (65m total) first and then try one tablet every 6 hours.  Avoid if you are having fevers or severe pain.  If you are not better or start feeling worse, stop all medicines and call your doctor for advice o Call your doctor if you are getting worse or not better.  Sometimes further testing (cultures, endoscopy, X-ray studies, bloodwork, etc) may be needed to help diagnose and treat the cause of the diarrhea.  TROUBLESHOOTING IRREGULAR BOWELS 1) Avoid extremes of bowel movements (no bad constipation/diarrhea) 2) Miralax 17gm mixed in 8oz. water or juice-daily. May use BID as needed.  3) Gas-x,Phazyme, etc. as needed for gas & bloating.  4) Soft,bland diet. No spicy,greasy,fried foods.  5) Prilosec over-the-counter as needed  6) May hold gluten/wheat products from diet to see if symptoms improve.  7)  May try probiotics (Align, Activa, etc) to help calm the bowels down 7) If symptoms become worse call back immediately.  Hernia Repair with Laparoscope A hernia occurs when an internal organ pushes out through a weak  spot in the belly (abdominal) wall muscles. Hernias  most commonly occur in the groin and around the navel. Hernias can also occur through a cut by the surgeon (incision) after an abdominal operation. A hernia may be caused by:  Lifting heavy objects.  Prolonged coughing.  Straining to move your bowels. Hernias can often be pushed back into place (reduced). Most hernias tend to get worse over time. Problems occur when abdominal contents get stuck in the opening and the blood supply is blocked or impaired (incarcerated hernia). Because of these risks, you require surgery to repair the hernia. Your hernia will be repaired using a laparoscope. Laparoscopic surgery is a type of minimally invasive surgery. It does not involve making a typical surgical cut (incision) in the skin. A laparoscope is a telescope-like rod and lens system. It is usually connected to a video camera and a light source so your caregiver can clearly see the operative area. The instruments are inserted through  to  inch (5 mm or 10 mm) openings in the skin at specific locations. A working and viewing space is created by blowing a small amount of carbon dioxide gas into the abdominal cavity. The abdomen is essentially blown up like a balloon (insufflated). This elevates the abdominal wall above the internal organs like a dome. The carbon dioxide gas is common to the human body and can be absorbed by tissue and removed by the respiratory system. Once the repair is completed, the small incisions will be closed with either stitches (sutures) or staples (just like a paper stapler only this staple holds the skin together). LET YOUR CAREGIVERS KNOW ABOUT:  Allergies.  Medications taken including herbs, eye drops, over the counter medications, and creams.  Use of steroids (by mouth or creams).  Previous problems with anesthetics or Novocaine.  Possibility of pregnancy, if this applies.  History of blood clots (thrombophlebitis).  History of bleeding or blood problems.  Previous  surgery.  Other health problems. BEFORE THE PROCEDURE  Laparoscopy can be done either in a hospital or out-patient clinic. You may be given a mild sedative to help you relax before the procedure. Once in the operating room, you will be given a general anesthesia to make you sleep (unless you and your caregiver choose a different anesthetic).  AFTER THE PROCEDURE  After the procedure you will be watched in a recovery area. Depending on what type of hernia was repaired, you might be admitted to the hospital or you might go home the same day. With this procedure you may have less pain and scarring. This usually results in a quicker recovery and less risk of infection. HOME CARE INSTRUCTIONS   Continue your normal activities but avoid heavy lifting (more than 50 pounds) or straining.  Cough gently. If you are a smoker it is best to stop, as even the best hernia repair can break down with the continual strain of coughing.  Avoid driving until off narcotics per your surgeon.  There are no dietary restrictions unless given otherwise.  TAKE ALL MEDICATIONS AS DIRECTED.  Only take over-the-counter or prescription medicines for pain, discomfort, or fever as directed by your caregiver. SEEK MEDICAL CARE IF:   There is increasing abdominal pain or pain in your incisions.  There is more bleeding from incisions, other than minimal spotting.  You feel light headed or faint.  You develop an unexplained fever, chills, and/or an oral temperature above 102 F (38.9 C).  You have redness, swelling, or increasing pain in  the wound.  Pus coming from wound.  A foul smell coming from the wound or dressings. SEEK IMMEDIATE MEDICAL CARE IF:   You develop a rash.  You have difficulty breathing.  You have any allergic problems. MAKE SURE YOU:   Understand these instructions.  Will watch your condition.  Will get help right away if you are not doing well or get worse. Document Released:  08/12/2005 Document Revised: 11/04/2011 Document Reviewed: 07/12/2009 [facility] Patient Information 2015 [place], [place]. This information is not intended to replace advice given to you by your health care provider. Make sure you discuss any questions you have with your health care provider.

## 2015-03-23 NOTE — Op Note (Signed)
03/23/2015  3:53 PM  PATIENT:  [name]  59 y.o. female  Patient Care Team: Lillard Anes, MD as PCP - General (Family Medicine) Newman Pies, MD as Consulting Physician (Neurosurgery) Park Liter, MD as Consulting Physician (Cardiology)  PRE-OPERATIVE DIAGNOSIS:  Ventral Wall Abdominal Hernia  POST-OPERATIVE DIAGNOSIS:  Incisional Ventral Wall Abdominal Hernia Moderate intra-abdominal adhesions with internal defects  PROCEDURE:  Procedure(s): LAPAROSCOPIC VENTRAL WALL HERNIA REPAIR LAPAROSCOPIC LYSIS OF ADHESIONS x 2 hours (2/3 of case)  SURGEON:  Surgeon(s): Michael Boston, MD  ASSISTANT: RN   ANESTHESIA:   local and general  EBL:  Total I/O In: 2200 [I.V.:2200] Out: 125 [Urine:75; Blood:50]  Delay start of Pharmacological VTE agent (>24hrs) due to surgical blood loss or risk of bleeding:  no  DRAINS: none   SPECIMEN:  No Specimen  DISPOSITION OF SPECIMEN:  N/A  COUNTS:  YES  PLAN OF CARE: Admit for overnight observation  PATIENT DISPOSITION:  PACU - hemodynamically stable.  INDICATION: Pleasant patient has developed a ventral wall abdominal hernia.   Recommendation was made for surgical repair:  The anatomy & physiology of the abdominal wall was discussed. The pathophysiology of hernias was discussed. Natural history risks without surgery including progeressive enlargement, pain, incarceration & strangulation was discussed. Contributors to complications such as smoking, obesity, diabetes, prior surgery, etc were discussed.  I feel the risks of no intervention will lead to serious problems that outweigh the operative risks; therefore, I recommended surgery to reduce and repair the hernia. I explained laparoscopic techniques with possible need for an open approach. I noted the probable use of mesh to patch and/or buttress the hernia repair  Risks such as bleeding, infection, abscess, need for further treatment, heart attack, death, and other  risks were discussed. I noted a good likelihood this will help address the problem. Goals of post-operative recovery were discussed as well. Possibility that this will not correct all symptoms was explained. I stressed the importance of low-impact activity, aggressive pain control, avoiding constipation, & not pushing through pain to minimize risk of post-operative chronic pain or injury. Possibility of reherniation especially with smoking, obesity, diabetes, immunosuppression, and other health conditions was discussed. We will work to minimize complications.  An educational handout further explaining the pathology & treatment options was given as well. Questions were answered. The patient expresses understanding & wishes to proceed with surgery.   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 The patient underwent general anaesthesia without difficulty. The patient was positioned appropriately. VTE prevention in place. The patient's abdomen was clipped, prepped, & draped in a sterile fashion. Surgical timeout confirmed our plan.  The patient was positioned in reverse Trendelenburg. Abdominal entry was gained using optical entry technique in the left upper abdomen. Entry was clean. I induced carbon dioxide insufflation. Camera inspection revealed no injury. Extra ports were carefully placed under direct laparoscopic visualization.   I could see  adhesions to the central two thirds of the midline abdomen up to the epigastric region.  I did laparoscopic lysis of adhesions to expose the entire anterior abdominal wall.  I  primarily used and  focused cold scissors.    I made sure hemostasis was good.   then proceeded to do lysis of adhesions on the visceral peritoneum.  I freed greater omentum off the small bowel.  I ran the small bowel from the cecum to the ligament of Treitz.  I freed numerous interloop adhesions.  Some provided internal defects with there is some bowel twisted underneath itself.  No major transition points but concerning.  I freed those off.  I re-ran the small bowel to prove no evidence of any serosal or other concerning defects.  Lysis adhesions took 2 hours.  because the most inferior defects were in the suprapubic region within an inch of the pubis, and end up freeing the dome of the bladder and peritoneum off the pubic rim.    The patient had numerous Swiss cheese hernias especially periumbilically with a few more defects going down to the suprapubic region.  I mapped out the region using a needle passer.   To ensure that I would have at least 5 cm radial coverage outside of the hernia defect, I chose a 33X25 cm dual sided mesh.  I placed #1 Prolene stitches around its edge about every 5 cm = 14 total.  I rolled the mesh & placed into the peritoneal cavity through the  most inferior 15 mm Swiss cheese hernia defect.  I unrolled  the mesh and positioned it appropriately.  inferior rim went down inferior to the pubic bone.    I secured the mesh to cover up the hernia defect using a laparoscopic suture passer to pass the tails of the Prolene through the abdominal wall & tagged them with clamps.  I started out in four corners to make sure I had the mesh centered under the hernia defect appropriately, and then proceeded to work in quadrants.  We evacuated CO2 & desufflated the abdomen.  I tied the fascial stitches down.  I reinsufflated the abdomen.  The mesh provided at least 5-10 cm circumferential coverage around the entire region of hernia defects.    I passed #1 proline sutures in the suprapubic region 2 to help tack the  mesh just superior to the pubic rim.  Also use this as a means to tack the peritoneum just cephalad to the dome of the bladder back up.  I tacked the edges & central part of the mesh to the peritoneum/posterior rectus fascia with  SecureStrap absorbable tacks.   Hemostasis was excellent.  I closed the  most inferior ventral hernia defect.  The using #1 PDS interrupted suture transversely.  I had initially done bupivacaine local and sedative blocks with suture placement.  At the end, I used liposomal bupivacaine (Experel) 100 mL total preperitoneal region the fascial sutures and the hernia closure site  I did reinspection. Hemostasis was good. Mesh laid well. Capnoperitoneum was evacuated. Ports were removed. The skin was closed with Monocryl at the port sites and Steri-Strips on the fascial stitch puncture sites.  Patient is being extubated to go to the recovery room. I'm about to discuss operative findings with the patient's family.   Adin Hector, M.D., F.A.C.S. Gastrointestinal and Minimally Invasive Surgery [facility] [address], [place], [place] [address] [phone] Main / Paging

## 2015-03-23 NOTE — Progress Notes (Signed)
Pt admitted to pacu with 3 ports to lt abd, one port on rt, and 15 sm incisional sites on with steri strips

## 2015-03-23 NOTE — Anesthesia Preprocedure Evaluation (Addendum)
Anesthesia Evaluation  Patient identified by MRN, date of birth, ID band Patient awake    Reviewed: Allergy & Precautions, NPO status , Patient's Chart, lab work & pertinent test results  Airway Mallampati: I  TM Distance: >3 FB Neck ROM: Full    Dental   Pulmonary former smoker,    Pulmonary exam normal       Cardiovascular + Past MI and +CHF Normal cardiovascular exam+ Cardiac Defibrillator  H/O Card[facility] Arrest at [facility] followed by AICD placement. ECHO: 09/2014 EF 40-45%   Neuro/Psych    GI/Hepatic GERD-  Medicated and Controlled,  Endo/Other    Renal/GU      Musculoskeletal   Abdominal   Peds  Hematology   Anesthesia Other Findings   Reproductive/Obstetrics                            Anesthesia Physical Anesthesia Plan  ASA: III  Anesthesia Plan: General   Post-op Pain Management:    Induction: Intravenous  Airway Management Planned: Oral ETT  Additional Equipment:   Intra-op Plan:   Post-operative Plan: Extubation in OR  Informed Consent: I have reviewed the patients History and Physical, chart, labs and discussed the procedure including the risks, benefits and alternatives for the proposed anesthesia with the patient or authorized representative who has indicated his/her understanding and acceptance.     Plan Discussed with: CRNA and Surgeon  Anesthesia Plan Comments:         Anesthesia Quick Evaluation

## 2015-03-23 NOTE — Anesthesia Postprocedure Evaluation (Signed)
Anesthesia Post Note  Patient: [name]  Procedure(s) Performed: Procedure(s) (LRB): LAPAROSCOPIC VENTRAL WALL HERNIA REPAIR (N/A) LAPAROSCOPIC LYSIS OF ADHESIONS (N/A)  Anesthesia type: general  Patient location: PACU  Post pain: Pain level controlled  Post assessment: Patient's Cardiovascular Status Stable  Last Vitals:  Filed Vitals:   03/23/15 1700  BP: 102/60  Pulse: 70  Temp:   Resp: 7    Post vital signs: Reviewed and stable  Level of consciousness: sedated  Complications: No apparent anesthesia complications

## 2015-03-23 NOTE — Interval H&P Note (Signed)
History and Physical Interval Note:  03/23/2015 12:05 PM  [name]  has presented today for surgery, with the diagnosis of Ventral Wall Abdominal Hernia  The various methods of treatment have been discussed with the patient and family. After consideration of risks, benefits and other options for treatment, the patient has consented to  Procedure(s): [place] (N/A) LAPAROSCOPIC LYSIS OF ADHESIONS (N/A) as a surgical intervention .  The patient's history has been reviewed, patient examined, no change in status, stable for surgery.  I have reviewed the patient's chart and labs.  Questions were answered to the patient's satisfaction.     [name]

## 2015-03-24 ENCOUNTER — Encounter (HOSPITAL_COMMUNITY): Payer: Self-pay | Admitting: Surgery

## 2015-03-24 MED ORDER — ENSURE ENLIVE PO LIQD: 237.0000 mL | Freq: Two times a day (BID) | ORAL | Status: DC

## 2015-03-24 MED ORDER — ENSURE ENLIVE PO LIQD
237.0000 mL | Freq: Two times a day (BID) | ORAL | Status: DC
  Administered 2015-03-24 (×2): 237 mL via ORAL

## 2015-03-24 MED ORDER — NAPROXEN 500 MG PO TABS
500.0000 mg | ORAL_TABLET | Freq: Two times a day (BID) | ORAL | Status: DC
  Filled 2015-03-24 (×2): qty 1

## 2015-03-24 NOTE — Progress Notes (Signed)
Patient is alert and oriented, vital signs are stable, incisions are within normal limits, patient tolerated her heart healthy diet without complaints of nausea or vomiting, patient up and ambulating and voiding as well, patient has adequate pain control with norco tablets, discharge instructions reviewed with patient and significant other, prescription given and patient instructed to follow up with physican Neta Mends RN 03-24-2015

## 2015-03-24 NOTE — Progress Notes (Signed)
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  Recommendations were made.  Questions were answered.  The patient expressed understanding & appreciation.   D/C patient from hospital when patient meets criteria (anticipate in 0-1 day(s)):  Tolerating oral intake well Ambulating well Adequate pain control without IV medications Urinating  Disposition planning in place   Adin Hector, M.D., F.A.C.S. Gastrointestinal and Minimally Invasive Surgery [facility] [address] [place], [place] [address] [phone] Main / Paging   03/24/2015  Subjective:  Sore - PO meds mostly handling it Tol full liquids Had bleeding at needle site - stopped last night Walked a little  Objective:  Vital signs:  Filed Vitals:   03/24/15 0534 03/24/15 0823 03/24/15 0908 03/24/15 1020  BP: 90/51 99/57  104/66  Pulse: 70 70  69  Temp: 98.5 F (36.9 C)   98.2 F (36.8 C)  TempSrc: Oral   Oral  Resp: 18   18  Height:      Weight:      SpO2: 95% 96% 94% 97%    Last BM Date: 03/22/15  Intake/Output   Yesterday:  07/28 0701 - 07/29 0700 In: 2837.5  [I.V.:2837.5] Out: [id number] [Urine:1125; Blood:50] This shift:  Total I/O In: -  Out: 400 [Urine:400]  Bowel function:  Flatus: y  BM: n  Drain: n/a  Physical Exam:  General: Pt awake/alert/oriented x4 in no acute distress.  Tired but not toxic Eyes: PERRL, normal EOM.  Sclera clear.  No icterus Neuro: CN II-XII intact w/o focal sensory/motor deficits. Lymph: No head/neck/groin lymphadenopathy Psych:  No delerium/psychosis/paranoia HENT: Normocephalic, Mucus membranes moist.  No thrush Neck: Supple, No tracheal deviation Chest: No chest wall pain w good excursion CV:  Pulses intact.  Regular rhythm MS: Normal AROM mjr joints.  No obvious deformity Abdomen: Soft.  Nondistended.  Abd binder in place.  Mildly tender at incisions only.  No evidence of peritonitis.  No incarcerated hernias.  Dressings clean - old blood at R suprapubic puncture site Ext:  SCDs BLE.  No mjr edema.  No cyanosis Skin: No petechiae / purpura  Results:   Labs: No results found for this or any previous visit (from the past 48 hour(s)).  Imaging / Studies: No results found.  Medications / Allergies: per chart  Antibiotics: Anti-infectives    Start     Dose/Rate Route Frequency Ordered Stop   03/23/15 2000  ceFAZolin (ANCEF) IVPB 1 g/50 mL premix     1 g 100 mL/hr over 30 Minutes Intravenous 3 times per day 03/23/15 1754 03/23/15 2035   03/23/15 1109  ceFAZolin (ANCEF) IVPB 2 g/50 mL premix     2 g 100 mL/hr over 30  Minutes Intravenous 60 min pre-op 03/23/15 1109 03/23/15 1240        Note: Portions of this report may have been transcribed using voice recognition software. Every effort was made to ensure accuracy; however, inadvertent computerized transcription errors may be present.   Any transcriptional errors that result from this process are unintentional.     Adin Hector, M.D., F.A.C.S. Gastrointestinal and Minimally Invasive Surgery [facility] [address]  [place], [place] [address] [phone] Main / Paging   03/24/2015  CARE TEAM:  PCP: Lillard Anes, MD  Outpatient Care Team: Patient Care Team: Lillard Anes, MD as PCP - General (Family Medicine) Newman Pies, MD as Consulting Physician (Neurosurgery) Park Liter, MD as Consulting Physician (Cardiology) Michael Boston, MD as Consulting Physician (General Surgery)  Inpatient Treatment Team: Treatment Team: Attending Provider: Michael Boston, MD; Technician: Sueanne Margarita, NT; Registered Nurse: Mortimer Fries, RN; Technician: Coralie Carpen, NT

## 2015-03-24 NOTE — Discharge Summary (Signed)
Physician Discharge Summary  Patient ID: DELFA [name] MRN: [id number] DOB/AGE: [birth year] 59 y.o.  Admit date: 03/23/2015 Discharge date: 03/24/2015  Patient Care Team: Lillard Anes, MD as PCP - General (Family Medicine) Newman Pies, MD as Consulting Physician (Neurosurgery) Park Liter, MD as Consulting Physician (Cardiology) Michael Boston, MD as Consulting Physician (General Surgery)  Admission Diagnoses: Principal Problem:   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 03/23/2015 Active Problems:   COPD (chronic obstructive pulmonary disease)   NICM (nonischemic cardiomyopathy)   Chronic back pain   Chronic narcotic use   GERD (gastroesophageal reflux disease)   Discharge Diagnoses:  Principal Problem:   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 03/23/2015 Active Problems:   COPD (chronic obstructive pulmonary disease)   NICM (nonischemic cardiomyopathy)   Chronic back pain   Chronic narcotic use   GERD (gastroesophageal reflux disease)   POST-OPERATIVE DIAGNOSIS:  Ventral Wall Abdominal Hernia  SURGERY:  Procedure(s): LAPAROSCOPIC VENTRAL WALL HERNIA REPAIR LAPAROSCOPIC LYSIS OF ADHESIONS  SURGEON:  Surgeon(s): Michael Boston, MD  Consults: None  Hospital Course:   The patient underwent the surgery above.  Postoperatively, the patient gradually mobilized and advanced to a solid diet.  Pain and other symptoms were treated aggressively.    By the time of discharge, the patient was walking well the hallways, eating food, having flatus.  Pain was well-controlled on an oral medications.  Based on meeting discharge criteria and continuing to recover, I felt it was safe for the patient to be discharged from the hospital to further recover with close followup. Postoperative recommendations were discussed in detail.  They are written as well.   Significant Diagnostic Studies:  No results found for  this or any previous visit (from the past 72 hour(s)).  No results found.  Discharge Exam: Blood pressure 104/66, pulse 69, temperature 98.2 F (36.8 C), temperature source Oral, resp. rate 18, height 5' (1.524 m), weight 63 kg (138 lb 14.2 oz), SpO2 97 %.  General: Pt awake/alert/oriented x4 in no major acute distress Eyes: PERRL, normal EOM. Sclera nonicteric Neuro: CN II-XII intact w/o focal sensory/motor deficits. Lymph: No head/neck/groin lymphadenopathy Psych:  No delerium/psychosis/paranoia HENT: Normocephalic, Mucus membranes moist.  No thrush Neck: Supple, No tracheal deviation Chest: No pain.  Good respiratory excursion. CV:  Pulses intact.  Regular rhythm MS: Normal AROM mjr joints.  No obvious deformity Abdomen: Soft, Nondistended.  Mildly tender at puncture sites.  No incarcerated hernias. Ext:  SCDs BLE.  No significant edema.  No cyanosis Skin: No petechiae / purpura  Discharged Condition: stable   Past Medical History  Diagnosis Date  . NICM (nonischemic cardiomyopathy)   . HLD (hyperlipidemia)   . Chronic back pain   . PVCs (premature ventricular contractions)   . Heart failure   . Left ventricular systolic dysfunction   . CHF (congestive heart failure)   . ICD (implantable cardiac defibrillator) in place 2012  . GERD (gastroesophageal reflux disease)   . Arthritis   . COPD (chronic obstructive pulmonary disease)   . AICD (automatic cardioverter/defibrillator) present 2012   . Asthma   . Myocardial infarction     2011 during intestinal blockage surgery    Past Surgical History  Procedure Laterality Date  . Neck surgery      fused  . Carpal tunnel release    . Hand surgery    . Appendectomy    . Tonsillectomy    . Cesarean section    .  Cholecystectomy    . Intestinal blockage 2011    . Ulnar nerve transposition  01/23/2012    Procedure: ULNAR NERVE DECOMPRESSION/TRANSPOSITION;  Surgeon: Ophelia Charter, MD;  Location: [facility];  Service:  Neurosurgery;  Laterality: Left;  LEFT ulnar nerve decompression    History   Social History  . Marital Status: Single    Spouse Name: N/A  . Number of Children: 3  . Years of Education: N/A   Occupational History  . disabled    Social History Main Topics  . Smoking status: Former Smoker -- 1.00 packs/day for 30 years    Types: Cigarettes    Quit date: 08/11/2010  . Smokeless tobacco: Never Used  . Alcohol Use: No  . Drug Use: No  . Sexual Activity: Not Currently   Other Topics Concern  . Not on file   Social History Narrative    Family History  Problem Relation Age of Onset  . Heart attack    . Cancer    . Heart failure    . Anesthesia problems Neg Hx   . Hypotension Neg Hx   . Malignant hyperthermia Neg Hx   . Pseudochol deficiency Neg Hx     Current Facility-Administered Medications  Medication Dose Route Frequency Provider Last Rate Last Dose  . 0.9 %  sodium chloride infusion  250 mL Intravenous PRN Michael Boston, MD      . alum & mag hydroxide-simeth (MAALOX/MYLANTA) 200-200-20 MG/5ML suspension 30 mL  30 mL Oral Q6H PRN Michael Boston, MD      . aspirin EC tablet 81 mg  81 mg Oral Daily Michael Boston, MD   81 mg at 03/24/15 1011  . bisacodyl (DULCOLAX) suppository 10 mg  10 mg Rectal Q12H PRN Michael Boston, MD      . carvedilol (COREG) tablet 3.125 mg  3.125 mg Oral BID WC Michael Boston, MD   3.125 mg at 03/24/15 0900  . diazepam (VALIUM) tablet 5 mg  5 mg Oral TID PRN Michael Boston, MD      . diphenhydrAMINE (BENADRYL) injection 12.5-25 mg  12.5-25 mg Intravenous Q6H PRN Michael Boston, MD      . feeding supplement (ENSURE ENLIVE) (ENSURE ENLIVE) liquid 237 mL  237 mL Oral BID BM Michael Boston, MD      . fentaNYL ([place] - dosed mcg/hr) 75 mcg  75 mcg Transdermal Q48H Michael Boston, MD   75 mcg at 03/23/15 1652  . fluticasone (FLONASE) 50 MCG/ACT nasal spray 1 spray  1 spray Each Nare Daily Michael Boston, MD   1 spray at 03/24/15 1011  . heparin injection 5,000  Units  5,000 Units Subcutaneous 3 times per day Michael Boston, MD   5,000 Units at 03/24/15 0501  . HYDROcodone-acetaminophen (NORCO) 10-325 MG per tablet 1-2 tablet  1-2 tablet Oral Q4H PRN Michael Boston, MD   2 tablet at 03/23/15 2300  . HYDROmorphone (DILAUDID) injection 0.5-2 mg  0.5-2 mg Intravenous Q2H PRN Michael Boston, MD   1 mg at 03/24/15 0910  . ipratropium (ATROVENT) nebulizer solution 0.5 mg  0.5 mg Nebulization Q4H PRN Michael Boston, MD      . ipratropium-albuterol (DUONEB) 0.5-2.5 (3) MG/3ML nebulizer solution 3 mL  3 mL Nebulization Q6H PRN Michael Boston, MD      . isosorbide mononitrate (IMDUR) 24 hr tablet 30 mg  30 mg Oral Daily Michael Boston, MD   30 mg at 03/24/15 1010  . lactated ringers bolus 1,000 mL  1,000  mL Intravenous Q8H PRN Michael Boston, MD      . lip balm (CARMEX) ointment 1 application  1 application Topical BID Michael Boston, MD   1 application at [id number]  . lisinopril (PRINIVIL,ZESTRIL) tablet 10 mg  10 mg Oral Daily Michael Boston, MD   10 mg at 03/24/15 1010  . magic mouthwash  15 mL Oral QID PRN Michael Boston, MD      . menthol-cetylpyridinium (CEPACOL) lozenge 3 mg  1 lozenge Oral PRN Michael Boston, MD      . methocarbamol (ROBAXIN) 1,000 mg in dextrose 5 % 50 mL IVPB  1,000 mg Intravenous Q6H PRN Michael Boston, MD   1,000 mg at 03/23/15 1654  . mometasone-formoterol (DULERA) 100-5 MCG/ACT inhaler 2 puff  2 puff Inhalation BID Michael Boston, MD   2 puff at 03/24/15 0907  . naproxen (NAPROSYN) tablet 500 mg  500 mg Oral BID WC Michael Boston, MD      . nitroGLYCERIN (NITROSTAT) SL tablet 0.4 mg  0.4 mg Sublingual Q5 min PRN Michael Boston, MD      . ondansetron [facility]) tablet 4 mg  4 mg Oral Q6H PRN Michael Boston, MD       Or  . ondansetron [facility]) injection 4 mg  4 mg Intravenous Q6H PRN Michael Boston, MD      . pantoprazole (PROTONIX) EC tablet 40 mg  40 mg Oral Daily Michael Boston, MD   40 mg at 03/24/15 1010  . phenol (CHLORASEPTIC) mouth spray 2 spray  2 spray  Mouth/Throat PRN Michael Boston, MD   2 spray at 03/23/15 2018  . polyethylene glycol (MIRALAX / GLYCOLAX) packet 17 g  17 g Oral Q12H PRN Michael Boston, MD      . polyethylene glycol (MIRALAX / GLYCOLAX) packet 17 g  17 g Oral BID Michael Boston, MD   17 g at 03/24/15 1010  . potassium chloride SA (K-DUR,KLOR-CON) CR tablet 20 mEq  20 mEq Oral Daily Michael Boston, MD   20 mEq at 03/24/15 1010  . promethazine (PHENERGAN) injection 6.25-12.5 mg  6.25-12.5 mg Intravenous Q4H PRN Michael Boston, MD      . ranolazine (RANEXA) 12 hr tablet 500 mg  500 mg Oral BID Michael Boston, MD   500 mg at 03/24/15 1010  . rosuvastatin (CRESTOR) tablet 10 mg  10 mg Oral Daily Michael Boston, MD   10 mg at 03/24/15 1010  . sodium chloride 0.9 % injection 3 mL  3 mL Intravenous Q12H Michael Boston, MD   0 mL at 03/23/15 2200  . sodium chloride 0.9 % injection 3 mL  3 mL Intravenous PRN Michael Boston, MD         No Known Allergies  Disposition: 01-Home or Self Care  Discharge Instructions    Call MD for:  extreme fatigue    Complete by:  As directed      Call MD for:  hives    Complete by:  As directed      Call MD for:  persistant nausea and vomiting    Complete by:  As directed      Call MD for:  redness, tenderness, or signs of infection (pain, swelling, redness, odor or green/yellow discharge around incision site)    Complete by:  As directed      Call MD for:  severe uncontrolled pain    Complete by:  As directed      Call MD for:    Complete by:  As directed   Temperature > 101.33F     Diet - low sodium heart healthy    Complete by:  As directed      Discharge instructions    Complete by:  As directed   Please see discharge instruction sheets.  Also refer to handout given an office.  Please call our office if you have any questions or concerns [phone]     Discharge wound care:    Complete by:  As directed   If you have closed incisions, shower and bathe over these incisions with soap and water every day.   Remove all surgical dressings on postoperative day #3.  You do not need to replace dressings over the closed incisions unless you feel more comfortable with a Band-Aid covering it.   If you have an open wound that requires packing, please see wound care instructions.  In general, remove all dressings, wash wound with soap and water and then replace with saline moistened gauze.  Do the dressing change at least every day.  Please call our office [phone] if you have further questions.     Driving Restrictions    Complete by:  As directed   No driving until off narcotics and can safely swerve away without pain during an emergency     Increase activity slowly    Complete by:  As directed   Walk an hour a day.  Use 20-30 minute walks.  When you can walk 30 minutes without difficulty, it is fine to restart low impact/moderate activities such as biking, jogging, swimming, sexual activity, etc.  Eventually you can increase to unrestricted activity when not feeling pain.  If you feel pain: STOP!Marland Kitchen   Let pain protect you from overdoing it.  Use ice/heat & over-the-counter pain medications to help minimize soreness.  If that is not enough, then use your narcotic pain prescription as needed to remain active.  It is better to take extra pain medications and be more active than to stay bedridden to avoid all pain medications.     Lifting restrictions    Complete by:  As directed   Avoid heavy lifting initially.  Do not push through pain.  You have no specific weight limit - if it hurts to do, DON'T DO IT.   If you feel no pain, you are not injuring anything.  Pain will protect you from injury.  Coughing and sneezing are far more stressful to your incision than any lifting.  Avoid resuming heavy lifting / intense activity until off all narcotic pain medications.  When ready to exercise more, give yourself 2 weeks to gradually get back to full intense exercise/activity.     May shower / Bathe    Complete by:  As  directed      May walk up steps    Complete by:  As directed      Sexual Activity Restrictions    Complete by:  As directed   Sexual activity as tolerated.  Do not push through pain.  Pain will protect you from injury.     Walk with assistance    Complete by:  As directed   Walk over an hour a day.  May use a walker/cane/companion to help with balance and stamina.            Medication List    TAKE these medications        albuterol-ipratropium 18-103 MCG/ACT inhaler  Commonly known as:  COMBIVENT  Inhale 2 puffs  into the lungs every 6 (six) hours as needed. For shortness of breath     aspirin 81 MG tablet  Take 81 mg by mouth daily.     carvedilol 3.125 MG tablet  Commonly known as:  COREG  Take 3.125 mg by mouth 2 (two) times daily with a meal.     diazepam 5 MG tablet  Commonly known as:  VALIUM  Take 1 tablet by mouth 3 (three) times daily as needed for anxiety.     esomeprazole 20 MG capsule  Commonly known as:  NEXIUM  Take 20 mg by mouth daily at 12 noon.     fentaNYL 75 MCG/HR  Commonly known as:  DURAGESIC - dosed mcg/hr  Place 1 patch onto the skin every other day.     fluticasone 50 MCG/ACT nasal spray  Commonly known as:  FLONASE  Apply 1 spray topically daily. Spray under patch     Fluticasone-Salmeterol 250-50 MCG/DOSE Aepb  Commonly known as:  ADVAIR  Inhale 1 puff into the lungs 2 (two) times daily as needed (SOB).     HYDROcodone-acetaminophen 10-325 MG per tablet  Commonly known as:  NORCO  Take 1-2 tablets by mouth every 6 (six) hours as needed for moderate pain or severe pain.     ipratropium 0.02 % nebulizer solution  Commonly known as:  ATROVENT  Take 0.5 mg by nebulization every 4 (four) hours as needed for wheezing or shortness of breath.     isosorbide mononitrate 30 MG 24 hr tablet  Commonly known as:  IMDUR  Take 30 mg by mouth daily.     lisinopril 10 MG tablet  Commonly known as:  PRINIVIL,ZESTRIL  Take 10 mg by mouth daily.      methocarbamol 750 MG tablet  Commonly known as:  ROBAXIN  Take 1 tablet (750 mg total) by mouth 4 (four) times daily as needed (use for muscle cramps/pain).     nitroGLYCERIN 0.4 MG SL tablet  Commonly known as:  NITROSTAT  Place 0.4 mg under the tongue every 5 (five) minutes as needed for chest pain.     potassium chloride 10 MEQ CR capsule  Commonly known as:  MICRO-K  Take 10 mEq by mouth 2 (two) times daily.     RANEXA 1000 MG SR tablet  Generic drug:  ranolazine  Take 500 mg by mouth 2 (two) times daily.     rosuvastatin 10 MG tablet  Commonly known as:  CRESTOR  Take 10 mg by mouth daily.           Follow-up Information    Follow up with Maritta Kief C., MD. Schedule an appointment as soon as possible for a visit in 3 weeks.   Specialty:  General Surgery   Why:  To follow up after your operation, To follow up after your hospital stay   Contact information:   [address] [place] [address] [phone]        Signed: Morton Peters, M.D., F.A.C.S. Gastrointestinal and Minimally Invasive Surgery [facility] [address], [place],  [phone] Main / Paging   03/24/2015, 10:42 AM

## 2015-03-25 LAB — GLUCOSE, CAPILLARY: Glucose-Capillary: 118 mg/dL — ABNORMAL HIGH (ref 65–99)

## 2015-03-26 ENCOUNTER — Emergency Department (HOSPITAL_COMMUNITY): Payer: Medicare Other

## 2015-03-26 ENCOUNTER — Encounter (HOSPITAL_COMMUNITY): Payer: Self-pay | Admitting: *Deleted

## 2015-03-26 ENCOUNTER — Inpatient Hospital Stay (HOSPITAL_COMMUNITY)
Admission: EM | Admit: 2015-03-26 | Discharge: 2015-03-30 | DRG: 336 | Disposition: A | Discharge status: Home Health | Payer: Medicare Other | Attending: Surgery | Admitting: Surgery

## 2015-03-26 DIAGNOSIS — R112 Nausea with vomiting, unspecified: Secondary | ICD-10-CM | POA: Diagnosis not present

## 2015-03-26 DIAGNOSIS — Z87891 Personal history of nicotine dependence: Secondary | ICD-10-CM | POA: Diagnosis not present

## 2015-03-26 DIAGNOSIS — I429 Cardiomyopathy, unspecified: Secondary | ICD-10-CM | POA: Diagnosis present

## 2015-03-26 DIAGNOSIS — M1990 Unspecified osteoarthritis, unspecified site: Secondary | ICD-10-CM | POA: Diagnosis present

## 2015-03-26 DIAGNOSIS — K430 Incisional hernia with obstruction, without gangrene: Secondary | ICD-10-CM | POA: Diagnosis present

## 2015-03-26 DIAGNOSIS — G8929 Other chronic pain: Secondary | ICD-10-CM | POA: Diagnosis present

## 2015-03-26 DIAGNOSIS — J9611 Chronic respiratory failure with hypoxia: Secondary | ICD-10-CM | POA: Diagnosis present

## 2015-03-26 DIAGNOSIS — I509 Heart failure, unspecified: Secondary | ICD-10-CM | POA: Diagnosis present

## 2015-03-26 DIAGNOSIS — E785 Hyperlipidemia, unspecified: Secondary | ICD-10-CM | POA: Diagnosis present

## 2015-03-26 DIAGNOSIS — I252 Old myocardial infarction: Secondary | ICD-10-CM | POA: Diagnosis not present

## 2015-03-26 DIAGNOSIS — I5020 Unspecified systolic (congestive) heart failure: Secondary | ICD-10-CM | POA: Diagnosis present

## 2015-03-26 DIAGNOSIS — Z7982 Long term (current) use of aspirin: Secondary | ICD-10-CM | POA: Diagnosis not present

## 2015-03-26 DIAGNOSIS — K6389 Other specified diseases of intestine: Secondary | ICD-10-CM | POA: Diagnosis not present

## 2015-03-26 DIAGNOSIS — K219 Gastro-esophageal reflux disease without esophagitis: Secondary | ICD-10-CM | POA: Diagnosis present

## 2015-03-26 DIAGNOSIS — J449 Chronic obstructive pulmonary disease, unspecified: Secondary | ICD-10-CM | POA: Diagnosis present

## 2015-03-26 DIAGNOSIS — Z8249 Family history of ischemic heart disease and other diseases of the circulatory system: Secondary | ICD-10-CM | POA: Diagnosis not present

## 2015-03-26 DIAGNOSIS — Z79891 Long term (current) use of opiate analgesic: Secondary | ICD-10-CM | POA: Diagnosis not present

## 2015-03-26 DIAGNOSIS — K567 Ileus, unspecified: Secondary | ICD-10-CM

## 2015-03-26 DIAGNOSIS — Z9049 Acquired absence of other specified parts of digestive tract: Secondary | ICD-10-CM | POA: Diagnosis present

## 2015-03-26 DIAGNOSIS — Z79899 Other long term (current) drug therapy: Secondary | ICD-10-CM | POA: Diagnosis not present

## 2015-03-26 DIAGNOSIS — J45909 Unspecified asthma, uncomplicated: Secondary | ICD-10-CM | POA: Diagnosis present

## 2015-03-26 DIAGNOSIS — K9189 Other postprocedural complications and disorders of digestive system: Secondary | ICD-10-CM | POA: Diagnosis present

## 2015-03-26 DIAGNOSIS — M549 Dorsalgia, unspecified: Secondary | ICD-10-CM | POA: Diagnosis present

## 2015-03-26 DIAGNOSIS — K469 Unspecified abdominal hernia without obstruction or gangrene: Secondary | ICD-10-CM | POA: Diagnosis present

## 2015-03-26 DIAGNOSIS — K660 Peritoneal adhesions (postprocedural) (postinfection): Secondary | ICD-10-CM | POA: Diagnosis present

## 2015-03-26 DIAGNOSIS — I10 Essential (primary) hypertension: Secondary | ICD-10-CM | POA: Diagnosis not present

## 2015-03-26 DIAGNOSIS — F1190 Opioid use, unspecified, uncomplicated: Secondary | ICD-10-CM | POA: Diagnosis present

## 2015-03-26 DIAGNOSIS — I2510 Atherosclerotic heart disease of native coronary artery without angina pectoris: Secondary | ICD-10-CM | POA: Diagnosis present

## 2015-03-26 DIAGNOSIS — Z95810 Presence of automatic (implantable) cardiac defibrillator: Secondary | ICD-10-CM | POA: Diagnosis not present

## 2015-03-26 HISTORY — DX: Ileus, unspecified: K56.7

## 2015-03-26 LAB — CBC WITH DIFFERENTIAL/PLATELET
Basophils Absolute: 0 10*3/uL (ref 0.0–0.1)
Basophils Relative: 0 % (ref 0–1)
Eosinophils Absolute: 0.2 10*3/uL (ref 0.0–0.7)
Eosinophils Relative: 1 % (ref 0–5)
HCT: 38.8 % (ref 36.0–46.0)
Hemoglobin: 12.6 g/dL (ref 12.0–15.0)
Lymphocytes Relative: 14 % (ref 12–46)
Lymphs Abs: 2.7 10*3/uL (ref 0.7–4.0)
MCH: 30.1 pg (ref 26.0–34.0)
MCHC: 32.5 g/dL (ref 30.0–36.0)
MCV: 92.6 fL (ref 78.0–100.0)
Monocytes Absolute: 1.3 10*3/uL — ABNORMAL HIGH (ref 0.1–1.0)
Monocytes Relative: 7 % (ref 3–12)
Neutro Abs: 14.9 10*3/uL — ABNORMAL HIGH (ref 1.7–7.7)
Neutrophils Relative %: 78 % — ABNORMAL HIGH (ref 43–77)
Platelets: 361 10*3/uL (ref 150–400)
RBC: 4.19 MIL/uL (ref 3.87–5.11)
RDW: 12.5 % (ref 11.5–15.5)
WBC: 19 10*3/uL — ABNORMAL HIGH (ref 4.0–10.5)

## 2015-03-26 LAB — URINE MICROSCOPIC-ADD ON

## 2015-03-26 LAB — COMPREHENSIVE METABOLIC PANEL
ALT: 14 U/L (ref 14–54)
AST: 17 U/L (ref 15–41)
Albumin: 3.3 g/dL — ABNORMAL LOW (ref 3.5–5.0)
Alkaline Phosphatase: 62 U/L (ref 38–126)
Anion gap: 10 (ref 5–15)
BUN: 9 mg/dL (ref 6–20)
CO2: 26 mmol/L (ref 22–32)
Calcium: 9.8 mg/dL (ref 8.9–10.3)
Chloride: 103 mmol/L (ref 101–111)
Creatinine, Ser: 0.63 mg/dL (ref 0.44–1.00)
GFR calc Af Amer: >60 mL/min (ref >60)
GFR calc non Af Amer: >60 mL/min (ref >60)
Glucose, Bld: 118 mg/dL — ABNORMAL HIGH (ref 65–99)
Potassium: 3.8 mmol/L (ref 3.5–5.1)
Sodium: 139 mmol/L (ref 135–145)
Total Bilirubin: 0.6 mg/dL (ref 0.3–1.2)
Total Protein: 6.7 g/dL (ref 6.5–8.1)

## 2015-03-26 LAB — URINALYSIS, ROUTINE W REFLEX MICROSCOPIC
Bilirubin Urine: NEGATIVE
Glucose, UA: NEGATIVE mg/dL
Ketones, ur: NEGATIVE mg/dL
Nitrite: POSITIVE — AB
Protein, ur: NEGATIVE mg/dL
Specific Gravity, Urine: >1.046 — ABNORMAL HIGH (ref 1.005–1.030)
Urobilinogen, UA: 1 mg/dL (ref 0.0–1.0)
pH: 7 (ref 5.0–8.0)

## 2015-03-26 LAB — LIPASE, BLOOD: Lipase: 13 U/L — ABNORMAL LOW (ref 22–51)

## 2015-03-26 LAB — I-STAT CG4 LACTIC ACID, ED: Lactic Acid, Venous: 0.96 mmol/L (ref 0.5–2.0)

## 2015-03-26 MED ORDER — FENTANYL 75 MCG/HR TD PT72
75.0000 ug | MEDICATED_PATCH | TRANSDERMAL | Status: DC
  Administered 2015-03-28 – 2015-03-30 (×2): 75 ug via TRANSDERMAL
  Filled 2015-03-26 (×2): qty 1

## 2015-03-26 MED ORDER — HYDROMORPHONE HCL 1 MG/ML IJ SOLN
1.0000 mg | Freq: Once | INTRAMUSCULAR | Status: AC
  Administered 2015-03-26: 1 mg via INTRAVENOUS
  Filled 2015-03-26: qty 1

## 2015-03-26 MED ORDER — HEPARIN SODIUM (PORCINE) 5000 UNIT/ML IJ SOLN
5000.0000 [IU] | Freq: Three times a day (TID) | INTRAMUSCULAR | Status: DC
Start: 2015-03-26 — End: 2015-03-30
  Administered 2015-03-26 – 2015-03-30 (×11): 5000 [IU] via SUBCUTANEOUS
  Filled 2015-03-26 (×15): qty 1

## 2015-03-26 MED ORDER — MORPHINE SULFATE 2 MG/ML IJ SOLN
1.0000 mg | INTRAMUSCULAR | Status: DC | PRN
  Administered 2015-03-26 – 2015-03-27 (×3): 3 mg via INTRAVENOUS
  Administered 2015-03-27: 2 mg via INTRAVENOUS
  Administered 2015-03-27 (×2): 3 mg via INTRAVENOUS
  Administered 2015-03-27: 1 mg via INTRAVENOUS
  Filled 2015-03-26: qty 1
  Filled 2015-03-26 (×6): qty 2

## 2015-03-26 MED ORDER — PANTOPRAZOLE SODIUM 40 MG IV SOLR
40.0000 mg | Freq: Every day | INTRAVENOUS | Status: DC
  Administered 2015-03-26: 40 mg via INTRAVENOUS
  Filled 2015-03-26 (×2): qty 40

## 2015-03-26 MED ORDER — MORPHINE SULFATE 2 MG/ML IJ SOLN
1.0000 mg | INTRAMUSCULAR | Status: DC | PRN
Start: 2015-03-26 — End: 2015-03-26
  Administered 2015-03-26: 2 mg via INTRAVENOUS
  Administered 2015-03-26: 3 mg via INTRAVENOUS
  Filled 2015-03-26: qty 1
  Filled 2015-03-26: qty 2

## 2015-03-26 MED ORDER — CHLORHEXIDINE GLUCONATE 0.12 % MT SOLN
15.0000 mL | Freq: Two times a day (BID) | OROMUCOSAL | Status: DC
  Administered 2015-03-26 – 2015-03-28 (×4): 15 mL via OROMUCOSAL
  Filled 2015-03-26 (×6): qty 15

## 2015-03-26 MED ORDER — CARVEDILOL 3.125 MG PO TABS
3.1250 mg | ORAL_TABLET | Freq: Two times a day (BID) | ORAL | Status: DC
  Filled 2015-03-26 (×4): qty 1

## 2015-03-26 MED ORDER — IPRATROPIUM-ALBUTEROL 0.5-2.5 (3) MG/3ML IN SOLN: 3.0000 mL | Freq: Four times a day (QID) | RESPIRATORY_TRACT | Status: DC | PRN

## 2015-03-26 MED ORDER — ONDANSETRON HCL 4 MG/2ML IJ SOLN
4.0000 mg | Freq: Once | INTRAMUSCULAR | Status: AC
  Administered 2015-03-26: 4 mg via INTRAVENOUS
  Filled 2015-03-26: qty 2

## 2015-03-26 MED ORDER — IOHEXOL 300 MG/ML  SOLN
100.0000 mL | Freq: Once | INTRAMUSCULAR | Status: AC | PRN
  Administered 2015-03-26: 100 mL via INTRAVENOUS

## 2015-03-26 MED ORDER — MOMETASONE FURO-FORMOTEROL FUM 100-5 MCG/ACT IN AERO
2.0000 | INHALATION_SPRAY | Freq: Two times a day (BID) | RESPIRATORY_TRACT | Status: DC
  Administered 2015-03-26 – 2015-03-30 (×8): 2 via RESPIRATORY_TRACT
  Filled 2015-03-26: qty 8.8

## 2015-03-26 MED ORDER — SODIUM CHLORIDE 0.9 % IV BOLUS (SEPSIS)
500.0000 mL | Freq: Once | INTRAVENOUS | Status: AC
  Administered 2015-03-26: 500 mL via INTRAVENOUS

## 2015-03-26 MED ORDER — RANOLAZINE ER 500 MG PO TB12
500.0000 mg | ORAL_TABLET | Freq: Two times a day (BID) | ORAL | Status: DC
  Administered 2015-03-26 – 2015-03-30 (×7): 500 mg via ORAL
  Filled 2015-03-26 (×9): qty 1

## 2015-03-26 MED ORDER — KCL IN DEXTROSE-NACL 20-5-0.45 MEQ/L-%-% IV SOLN
INTRAVENOUS | Status: DC
  Administered 2015-03-26 – 2015-03-28 (×6): via INTRAVENOUS
  Filled 2015-03-26 (×6): qty 1000

## 2015-03-26 MED ORDER — DIAZEPAM 5 MG PO TABS
5.0000 mg | ORAL_TABLET | Freq: Three times a day (TID) | ORAL | Status: DC | PRN
Start: 2015-03-26 — End: 2015-03-27
  Administered 2015-03-26: 5 mg via ORAL
  Filled 2015-03-26: qty 1

## 2015-03-26 MED ORDER — ISOSORBIDE MONONITRATE ER 30 MG PO TB24
30.0000 mg | ORAL_TABLET | Freq: Every day | ORAL | Status: DC
  Filled 2015-03-26 (×2): qty 1

## 2015-03-26 MED ORDER — NITROGLYCERIN 0.4 MG SL SUBL: 0.4000 mg | SUBLINGUAL_TABLET | SUBLINGUAL | Status: DC | PRN

## 2015-03-26 MED ORDER — IPRATROPIUM BROMIDE 0.02 % IN SOLN: 0.5000 mg | RESPIRATORY_TRACT | Status: DC | PRN

## 2015-03-26 MED ORDER — SODIUM CHLORIDE 0.9 % IV SOLN
INTRAVENOUS | Status: DC
  Administered 2015-03-26: 13:00:00 via INTRAVENOUS

## 2015-03-26 MED ORDER — PROMETHAZINE HCL 25 MG/ML IJ SOLN
25.0000 mg | Freq: Four times a day (QID) | INTRAMUSCULAR | Status: DC | PRN
  Administered 2015-03-26 – 2015-03-27 (×2): 25 mg via INTRAVENOUS
  Filled 2015-03-26 (×2): qty 1

## 2015-03-26 MED ORDER — CETYLPYRIDINIUM CHLORIDE 0.05 % MT LIQD
7.0000 mL | Freq: Two times a day (BID) | OROMUCOSAL | Status: DC
  Administered 2015-03-27 (×2): 7 mL via OROMUCOSAL

## 2015-03-26 MED ORDER — ONDANSETRON HCL 4 MG/2ML IJ SOLN
4.0000 mg | Freq: Four times a day (QID) | INTRAMUSCULAR | Status: DC | PRN
  Administered 2015-03-26 (×2): 4 mg via INTRAVENOUS
  Filled 2015-03-26 (×2): qty 2

## 2015-03-26 NOTE — ED Notes (Signed)
Patient  Tried to void using bed pan and female urinal.  No success. Reported to [place].

## 2015-03-26 NOTE — ED Provider Notes (Signed)
CSN: [id number]     Arrival date & time 03/26/15  1136 History   First MD Initiated Contact with Patient 03/26/15 1206     Chief Complaint  Patient presents with  . Abdominal Pain  . Nausea  . Emesis     (Consider location/radiation/quality/duration/timing/severity/associated sxs/prior Treatment) HPI Patient had a lab risk of a hernia repair done Thursday which was 3 days ago. She reports she did not have severe pain the first 2 days. She reports yesterday she started not feeling well and she was uncomfortable. Today she reports that the pain has become severe and she has had vomiting and dry heaves. The pain is diffuse and cramping in nature. She denies associated fever or chills. She reports she has had increased bloody drainage from some of the wound sites. Past Medical History  Diagnosis Date  . NICM (nonischemic cardiomyopathy)   . HLD (hyperlipidemia)   . Chronic back pain   . PVCs (premature ventricular contractions)   . Heart failure   . Left ventricular systolic dysfunction   . CHF (congestive heart failure)   . ICD (implantable cardiac defibrillator) in place 2012  . GERD (gastroesophageal reflux disease)   . Arthritis   . COPD (chronic obstructive pulmonary disease)   . AICD (automatic cardioverter/defibrillator) present 2012   . Asthma   . Myocardial infarction     2011 during intestinal blockage surgery   Past Surgical History  Procedure Laterality Date  . Neck surgery      fused  . Carpal tunnel release    . Hand surgery    . Appendectomy    . Tonsillectomy    . Cesarean section    . Cholecystectomy    . Intestinal blockage 2011    . Ulnar nerve transposition  01/23/2012    Procedure: ULNAR NERVE DECOMPRESSION/TRANSPOSITION;  Surgeon: Ophelia Charter, MD;  Location: [place] [facility];  Service: Neurosurgery;  Laterality: Left;  LEFT ulnar nerve decompression  . Laparoscopic assisted ventral hernia repair N/A 03/23/2015    Procedure: LAPAROSCOPIC VENTRAL WALL  HERNIA REPAIR;  Surgeon: Michael Boston, MD;  Location: [facility];  Service: General;  Laterality: N/A;  With MESH  . Laparoscopic lysis of adhesions N/A 03/23/2015    Procedure: LAPAROSCOPIC LYSIS OF ADHESIONS;  Surgeon: Michael Boston, MD;  Location: [facility];  Service: General;  Laterality: N/A;   Family History  Problem Relation Age of Onset  . Heart attack    . Cancer    . Heart failure    . Anesthesia problems Neg Hx   . Hypotension Neg Hx   . Malignant hyperthermia Neg Hx   . Pseudochol deficiency Neg Hx    History  Substance Use Topics  . Smoking status: Former Smoker -- 1.00 packs/day for 30 years    Types: Cigarettes    Quit date: 08/11/2010  . Smokeless tobacco: Never Used  . Alcohol Use: No   OB History    No data available     Review of Systems  10 Systems reviewed and are negative for acute change except as noted in the HPI.   Allergies  Review of patient's allergies indicates no known allergies.  Home Medications   Prior to Admission medications   Medication Sig Start Date End Date Taking? Authorizing Provider  albuterol-ipratropium (COMBIVENT) 18-103 MCG/ACT inhaler Inhale 2 puffs into the lungs every 6 (six) hours as needed. For shortness of breath   Yes Historical Provider, MD  aspirin 81 MG tablet Take 81 mg  by mouth daily.     Yes Historical Provider, MD  carvedilol (COREG) 3.125 MG tablet Take 3.125 mg by mouth 2 (two) times daily with a meal.   Yes Historical Provider, MD  diazepam (VALIUM) 5 MG tablet Take 1 tablet by mouth 3 (three) times daily as needed for anxiety.  03/02/15  Yes Historical Provider, MD  esomeprazole (NEXIUM) 20 MG capsule Take 20 mg by mouth daily at 12 noon.   Yes Historical Provider, MD  fentaNYL (DURAGESIC - DOSED MCG/HR) 75 MCG/HR Place 1 patch onto the skin every other day.    Yes Historical Provider, MD  fluticasone (FLONASE) 50 MCG/ACT nasal spray Apply 1 spray topically daily. Spray under patch   Yes Historical Provider, MD   Fluticasone-Salmeterol (ADVAIR) 250-50 MCG/DOSE AEPB Inhale 1 puff into the lungs 2 (two) times daily as needed (SOB).    Yes Historical Provider, MD  ipratropium (ATROVENT) 0.02 % nebulizer solution Take 0.5 mg by nebulization every 4 (four) hours as needed for wheezing or shortness of breath.   Yes Historical Provider, MD  isosorbide mononitrate (IMDUR) 30 MG 24 hr tablet Take 30 mg by mouth daily.   Yes Historical Provider, MD  lisinopril (PRINIVIL,ZESTRIL) 10 MG tablet Take 10 mg by mouth daily.   Yes Historical Provider, MD  methocarbamol (ROBAXIN) 750 MG tablet Take 1 tablet (750 mg total) by mouth 4 (four) times daily as needed (use for muscle cramps/pain). 03/23/15  Yes Michael Boston, MD  nitroGLYCERIN (NITROSTAT) 0.4 MG SL tablet Place 0.4 mg under the tongue every 5 (five) minutes as needed for chest pain.   Yes Historical Provider, MD  potassium chloride (MICRO-K) 10 MEQ CR capsule Take 10 mEq by mouth 2 (two) times daily.   Yes Historical Provider, MD  ranolazine (RANEXA) 1000 MG SR tablet Take 500 mg by mouth 2 (two) times daily.   Yes Historical Provider, MD  rosuvastatin (CRESTOR) 10 MG tablet Take 10 mg by mouth daily.   Yes Historical Provider, MD  HYDROcodone-acetaminophen (NORCO) 10-325 MG per tablet Take 1-2 tablets by mouth every 4 (four) hours as needed for moderate pain or severe pain. 03/30/15   Michael Boston, MD  naproxen (NAPROSYN) 500 MG tablet Take 1 tablet (500 mg total) by mouth 2 (two) times daily with a meal. 03/30/15   Michael Boston, MD   BP 100/50 mmHg  Pulse 69  Temp(Src) 97.9 F (36.6 C) (Oral)  Resp 16  Ht 5' (1.524 m)  Wt 135 lb (61.236 kg)  BMI 26.37 kg/m2  SpO2 93% Physical Exam  Constitutional: She is oriented to person, place, and time. She appears well-developed and well-nourished.  Patient appears to be in pain and is having episodes of retching. She is alert and nontoxic. She does not have respiratory distress.  HENT:  Head: Normocephalic and  atraumatic.  Eyes: EOM are normal. No scleral icterus.  Neck: Neck supple.  Cardiovascular: Normal rate, regular rhythm, normal heart sounds and intact distal pulses.   Pulmonary/Chest: Effort normal and breath sounds normal.  Abdominal: Soft. She exhibits no distension. There is tenderness.  Patient has multiple laparoscopic entry points circumferentially around the outer abdomen. There is ecchymotic bruising in the lower pubic region. Patient endorses tenderness to palpation throughout. There is serosanguineous drainage from several of the lower incisional sites.  Musculoskeletal: Normal range of motion. She exhibits no edema or tenderness.  Neurological: She is alert and oriented to person, place, and time. She has normal strength. Coordination normal. GCS eye  subscore is 4. GCS verbal subscore is 5. GCS motor subscore is 6.  Skin: Skin is warm, dry and intact.  Psychiatric: She has a normal mood and affect.    ED Course  Procedures (including critical care time) Labs Review Labs Reviewed  COMPREHENSIVE METABOLIC PANEL - Abnormal; Notable for the following:    Glucose, Bld 118 (*)    Albumin 3.3 (*)    All other components within normal limits  LIPASE, BLOOD - Abnormal; Notable for the following:    Lipase 13 (*)    All other components within normal limits  CBC WITH DIFFERENTIAL/PLATELET - Abnormal; Notable for the following:    WBC 19.0 (*)    Neutrophils Relative % 78 (*)    Neutro Abs 14.9 (*)    Monocytes Absolute 1.3 (*)    All other components within normal limits  URINALYSIS, ROUTINE W REFLEX MICROSCOPIC (NOT AT [facility]) - Abnormal; Notable for the following:    APPearance CLOUDY (*)    Specific Gravity, Urine >1.046 (*)    Hgb urine dipstick MODERATE (*)    Nitrite POSITIVE (*)    Leukocytes, UA MODERATE (*)    All other components within normal limits  URINE MICROSCOPIC-ADD ON - Abnormal; Notable for the following:    Squamous Epithelial / LPF MANY (*)    Bacteria, UA  MANY (*)    All other components within normal limits  BASIC METABOLIC PANEL - Abnormal; Notable for the following:    Glucose, Bld 144 (*)    Calcium 8.7 (*)    All other components within normal limits  CBC - Abnormal; Notable for the following:    WBC 14.4 (*)    All other components within normal limits  I-STAT CG4 LACTIC ACID, ED    Imaging Review No results found.   EKG Interpretation None      MDM   Final diagnoses:  Ileus following gastrointestinal surgery   Surgery has been consult it. The patient will be admitted for ileus versus bowel obstruction post hernia repair.    Charlesetta Shanks, MD 04/03/15 1414

## 2015-03-26 NOTE — H&P (Signed)
Re:   [name] DOB:   1955/09/04 MRN:   [id number]   Admission [facility]  ASSESSMENT AND PLAN: 1.  Ileus post op  Will admit, keep NPO, IVF, check labs and KUB in AM  2.  Lap ventral hernia repair by Dr. Clyda Greener - 03/23/2015  No obvious injury  CT scan supports ileus  3.  Left systolic CHF 4.  ICD  Sees Dr. Jenne Campus 5.  CAD  MI in 2011 6.  COPD/Asthma  Quit smoking in 2011 7.  Chronic pain - on hydrocodone and Fentanyl  Sees Dr. Emmaline Kluver in [facility] for chronic pain management 8.  Prior neck surgery (plate) and chronic back pain, DJD 9.  Chronic constipation. 10.  On Chronic disability x 20 years.    Chief Complaint  Patient presents with  . Abdominal Pain  . Nausea  . Emesis   REFERRING PHYSICIAN: PERRY,LAWRENCE EDWARD, MD  HISTORY OF PRESENT ILLNESS: [name] is a 59 y.o. (DOB: 1956/05/13) white  female whose primary care physician is PERRY,LAWRENCE Percell Miller, MD and comes to the [facility] ER today for nausea and vomiting. She is accompanied with her boyfriend, Marcy Panning.  She has had an appendectomy, 2 C sections, and in 2011, she had a enterolysis by Dr. Dory Larsen in [place].  She developed an abdominal wall hernia.   Dr. Johney Maine repaired this hernia on Thursday, 03/23/2015.  She went home Friday.  But she never did well at home.  Today she awoke with worsening abdominal pain, nausea and vomiting.  She came to the [facility] ER because of those symptoms.  She has underlying constipation where she has a BM every 2 or 3 days.  I would guess her chronic narcotics contribute to this.   Past Medical History  Diagnosis Date  . NICM (nonischemic cardiomyopathy)   . HLD (hyperlipidemia)   . Chronic back pain   . PVCs (premature ventricular contractions)   . Heart failure   . Left ventricular systolic dysfunction   . CHF (congestive heart failure)   . ICD (implantable cardiac defibrillator) in place 2012  . GERD (gastroesophageal reflux disease)   . Arthritis   . COPD  (chronic obstructive pulmonary disease)   . AICD (automatic cardioverter/defibrillator) present 2012   . Asthma   . Myocardial infarction     2011 during intestinal blockage surgery      Past Surgical History  Procedure Laterality Date  . Neck surgery      fused  . Carpal tunnel release    . Hand surgery    . Appendectomy    . Tonsillectomy    . Cesarean section    . Cholecystectomy    . Intestinal blockage 2011    . Ulnar nerve transposition  01/23/2012    Procedure: ULNAR NERVE DECOMPRESSION/TRANSPOSITION;  Surgeon: Ophelia Charter, MD;  Location: [facility];  Service: Neurosurgery;  Laterality: Left;  LEFT ulnar nerve decompression  . Laparoscopic assisted ventral hernia repair N/A 03/23/2015    Procedure: LAPAROSCOPIC VENTRAL WALL HERNIA REPAIR;  Surgeon: Michael Boston, MD;  Location: [facility];  Service: General;  Laterality: N/A;  With MESH  . Laparoscopic lysis of adhesions N/A 03/23/2015    Procedure: LAPAROSCOPIC LYSIS OF ADHESIONS;  Surgeon: Michael Boston, MD;  Location: [facility];  Service: General;  Laterality: N/A;      Current Facility-Administered Medications  Medication Dose Route Frequency Provider Last Rate Last Dose  . 0.9 %  sodium chloride infusion  Intravenous Continuous Charlesetta Shanks, MD 100 mL/hr at 03/26/15 1318     Current Outpatient Prescriptions  Medication Sig Dispense Refill  . albuterol-ipratropium (COMBIVENT) 18-103 MCG/ACT inhaler Inhale 2 puffs into the lungs every 6 (six) hours as needed. For shortness of breath    . aspirin 81 MG tablet Take 81 mg by mouth daily.      . carvedilol (COREG) 3.125 MG tablet Take 3.125 mg by mouth 2 (two) times daily with a meal.    . diazepam (VALIUM) 5 MG tablet Take 1 tablet by mouth 3 (three) times daily as needed for anxiety.   3  . esomeprazole (NEXIUM) 20 MG capsule Take 20 mg by mouth daily at 12 noon.    . fentaNYL (DURAGESIC - DOSED MCG/HR) 75 MCG/HR Place 1 patch onto the skin every other day.     .  fluticasone (FLONASE) 50 MCG/ACT nasal spray Apply 1 spray topically daily. Spray under patch    . Fluticasone-Salmeterol (ADVAIR) 250-50 MCG/DOSE AEPB Inhale 1 puff into the lungs 2 (two) times daily as needed (SOB).     Marland Kitchen HYDROcodone-acetaminophen (NORCO) 10-325 MG per tablet Take 1-2 tablets by mouth every 6 (six) hours as needed for moderate pain or severe pain. 50 tablet 0  . ipratropium (ATROVENT) 0.02 % nebulizer solution Take 0.5 mg by nebulization every 4 (four) hours as needed for wheezing or shortness of breath.    . isosorbide mononitrate (IMDUR) 30 MG 24 hr tablet Take 30 mg by mouth daily.    Marland Kitchen lisinopril (PRINIVIL,ZESTRIL) 10 MG tablet Take 10 mg by mouth daily.    . methocarbamol (ROBAXIN) 750 MG tablet Take 1 tablet (750 mg total) by mouth 4 (four) times daily as needed (use for muscle cramps/pain). 30 tablet 2  . nitroGLYCERIN (NITROSTAT) 0.4 MG SL tablet Place 0.4 mg under the tongue every 5 (five) minutes as needed for chest pain.    . potassium chloride (MICRO-K) 10 MEQ CR capsule Take 10 mEq by mouth 2 (two) times daily.    . ranolazine (RANEXA) 1000 MG SR tablet Take 500 mg by mouth 2 (two) times daily.    . rosuvastatin (CRESTOR) 10 MG tablet Take 10 mg by mouth daily.       No Known Allergies  REVIEW OF SYSTEMS: Skin:  No history of rash.  No history of abnormal moles. Infection:  No history of hepatitis or HIV.  No history of MRSA. Neurologic:  No history of stroke.  No history of seizure.  No history of headaches. Cardiac: Left systolic CHF.  ICD  Sees Dr. Jenne Campus ([facility]) CAD with MI in 2011 Pulmonary:  COPD  Endocrine:  No diabetes. No thyroid disease. Gastrointestinal:  See HPI.  Has chronic constipation. Urologic:  History of bladder ulcer - cysto 10/05/2007 Terance Hart Musculoskeletal:  No history of joint or back disease. Hematologic:  No bleeding disorder.  No history of anemia.  Not anticoagulated. Psycho-social:  The patient is  oriented.   The patient has no obvious psychologic or social impairment to understanding our conversation and plan.  SOCIAL and FAMILY HISTORY: She is accompanied with her boyfriend, Marcy Panning. On chronic disability.  PHYSICAL EXAM: BP 142/80 mmHg  Pulse 73  Temp(Src) 97.6 F (36.4 C) (Oral)  Resp 20  SpO2 94%  General: Older looking WF.  She does not look comfortable. HEENT: Normal. Pupils equal. Neck: Supple. No mass.  No thyroid mass. Lymph Nodes:  No supraclavicular or cervical nodes. Lungs: Some  rhonchi.  Breath sounds equal.  She has an ACD device in the left upper chest. Heart:  RRR. No murmur or rub.  Abdomen: Distended, but not tight.  Bandages from recent surgery.  No BS.  Extremities:  Good strength and ROM  in upper and lower extremities. Neurologic:  Grossly intact to motor and sensory function.   DATA REVIEWED: Epic notes.  Alphonsa Overall, MD,  [facility], [address]  [place], [place]    [place] Phone:  [phone] FAX:  [phone]

## 2015-03-26 NOTE — ED Notes (Signed)
Patient transported to CT 

## 2015-03-26 NOTE — ED Notes (Signed)
Unable to collect labs at this time Nurse Tech working with patient.

## 2015-03-26 NOTE — ED Notes (Signed)
Urine attempted to be collected.  Unsuccessful.

## 2015-03-26 NOTE — ED Notes (Addendum)
Pt complains of 10/10 abdominal pain, nausea, vomiting since this morning. Pt had laparoscopic hernia repair on 7/28. Pt states she took norco this morning, pt states she did not have nausea medication. Pt denies diarrhea.

## 2015-03-27 ENCOUNTER — Inpatient Hospital Stay (HOSPITAL_COMMUNITY): Payer: Medicare Other

## 2015-03-27 LAB — CBC
HCT: 37.8 % (ref 36.0–46.0)
Hemoglobin: 12.4 g/dL (ref 12.0–15.0)
MCH: 30.6 pg (ref 26.0–34.0)
MCHC: 32.8 g/dL (ref 30.0–36.0)
MCV: 93.3 fL (ref 78.0–100.0)
Platelets: 327 10*3/uL (ref 150–400)
RBC: 4.05 MIL/uL (ref 3.87–5.11)
RDW: 12.8 % (ref 11.5–15.5)
WBC: 14.4 10*3/uL — ABNORMAL HIGH (ref 4.0–10.5)

## 2015-03-27 LAB — BASIC METABOLIC PANEL
Anion gap: 7 (ref 5–15)
BUN: 7 mg/dL (ref 6–20)
CO2: 27 mmol/L (ref 22–32)
Calcium: 8.7 mg/dL — ABNORMAL LOW (ref 8.9–10.3)
Chloride: 106 mmol/L (ref 101–111)
Creatinine, Ser: 0.44 mg/dL (ref 0.44–1.00)
GFR calc Af Amer: >60 mL/min (ref >60)
GFR calc non Af Amer: >60 mL/min (ref >60)
Glucose, Bld: 144 mg/dL — ABNORMAL HIGH (ref 65–99)
Potassium: 3.5 mmol/L (ref 3.5–5.1)
Sodium: 140 mmol/L (ref 135–145)

## 2015-03-27 MED ORDER — MAGIC MOUTHWASH
15.0000 mL | Freq: Four times a day (QID) | ORAL | Status: DC | PRN
  Filled 2015-03-27: qty 15

## 2015-03-27 MED ORDER — LACTATED RINGERS IV BOLUS (SEPSIS)
1000.0000 mL | Freq: Once | INTRAVENOUS | Status: AC
  Administered 2015-03-27: 1000 mL via INTRAVENOUS

## 2015-03-27 MED ORDER — LIP MEDEX EX OINT
1.0000 "application " | TOPICAL_OINTMENT | Freq: Two times a day (BID) | CUTANEOUS | Status: DC
  Administered 2015-03-27 – 2015-03-30 (×7): 1 via TOPICAL
  Filled 2015-03-27 (×2): qty 7

## 2015-03-27 MED ORDER — PHENOL 1.4 % MT LIQD
1.0000 | OROMUCOSAL | Status: DC | PRN
  Administered 2015-03-27: 1 via OROMUCOSAL
  Filled 2015-03-27: qty 177

## 2015-03-27 MED ORDER — LACTATED RINGERS IV BOLUS (SEPSIS): 1000.0000 mL | Freq: Three times a day (TID) | INTRAVENOUS | Status: DC | PRN

## 2015-03-27 MED ORDER — KETOROLAC TROMETHAMINE 15 MG/ML IJ SOLN: 15.0000 mg | Freq: Four times a day (QID) | INTRAMUSCULAR | Status: DC | PRN

## 2015-03-27 MED ORDER — ACETAMINOPHEN 650 MG RE SUPP: 650.0000 mg | Freq: Four times a day (QID) | RECTAL | Status: DC | PRN

## 2015-03-27 MED ORDER — METOPROLOL TARTRATE 1 MG/ML IV SOLN
5.0000 mg | Freq: Four times a day (QID) | INTRAVENOUS | Status: DC
  Administered 2015-03-27 – 2015-03-29 (×8): 5 mg via INTRAVENOUS
  Filled 2015-03-27 (×12): qty 5

## 2015-03-27 MED ORDER — HYDROMORPHONE HCL 1 MG/ML IJ SOLN
1.0000 mg | INTRAMUSCULAR | Status: DC | PRN
  Administered 2015-03-27 – 2015-03-28 (×5): 1 mg via INTRAVENOUS
  Filled 2015-03-27 (×5): qty 1

## 2015-03-27 MED ORDER — DIAZEPAM 5 MG/ML IJ SOLN: 5.0000 mg | Freq: Three times a day (TID) | INTRAMUSCULAR | Status: DC | PRN

## 2015-03-27 MED ORDER — HYDROMORPHONE HCL 1 MG/ML IJ SOLN
0.5000 mg | INTRAMUSCULAR | Status: DC | PRN
Start: 2015-03-27 — End: 2015-03-27

## 2015-03-27 MED ORDER — METOPROLOL TARTRATE 1 MG/ML IV SOLN
5.0000 mg | Freq: Four times a day (QID) | INTRAVENOUS | Status: DC | PRN
  Filled 2015-03-27: qty 5

## 2015-03-27 MED ORDER — DIPHENHYDRAMINE HCL 50 MG/ML IJ SOLN: 12.5000 mg | Freq: Four times a day (QID) | INTRAMUSCULAR | Status: DC | PRN

## 2015-03-27 MED ORDER — PANTOPRAZOLE SODIUM 40 MG IV SOLR
40.0000 mg | Freq: Two times a day (BID) | INTRAVENOUS | Status: DC
  Administered 2015-03-27 – 2015-03-30 (×7): 40 mg via INTRAVENOUS
  Filled 2015-03-27 (×8): qty 40

## 2015-03-27 MED ORDER — ALUM & MAG HYDROXIDE-SIMETH 200-200-20 MG/5ML PO SUSP: 30.0000 mL | Freq: Four times a day (QID) | ORAL | Status: DC | PRN

## 2015-03-27 MED ORDER — BISACODYL 10 MG RE SUPP
10.0000 mg | Freq: Every day | RECTAL | Status: DC
  Administered 2015-03-27 – 2015-03-30 (×3): 10 mg via RECTAL
  Filled 2015-03-27 (×3): qty 1

## 2015-03-27 MED ORDER — FLUTICASONE PROPIONATE 50 MCG/ACT NA SUSP
1.0000 | Freq: Every day | NASAL | Status: DC
  Administered 2015-03-27 – 2015-03-30 (×4): 1 via NASAL
  Filled 2015-03-27: qty 16

## 2015-03-27 MED ORDER — MENTHOL 3 MG MT LOZG: 1.0000 | LOZENGE | OROMUCOSAL | Status: DC | PRN

## 2015-03-27 MED ORDER — KETOROLAC TROMETHAMINE 15 MG/ML IJ SOLN
15.0000 mg | Freq: Four times a day (QID) | INTRAMUSCULAR | Status: AC
  Administered 2015-03-27 – 2015-03-29 (×8): 15 mg via INTRAVENOUS
  Filled 2015-03-27 (×8): qty 1

## 2015-03-27 MED ORDER — METHOCARBAMOL 1000 MG/10ML IJ SOLN
1000.0000 mg | Freq: Four times a day (QID) | INTRAVENOUS | Status: DC | PRN
  Filled 2015-03-27: qty 10

## 2015-03-27 MED ORDER — PROMETHAZINE HCL 25 MG/ML IJ SOLN: 6.2500 mg | INTRAMUSCULAR | Status: DC | PRN

## 2015-03-27 NOTE — Progress Notes (Signed)
[address] [place], [place] [address] Phone: [phone] FAX: [place] [phone] February 07, 1956   Problem List:   Active Problems:   Ileus following gastrointestinal surgery        Assessment  Ileus in setting of chronic narcotic use & large LOA/mesh  Plan:  -IVF -NGT decompression -improve pain control - try to max nonnarcotic -enema to help stimulate BMs -anxiolysis -RAD/COPD Tx -VTE prophylaxis- SCDs, etc -mobilize as tolerated to help recovery.  GET HER UP! -aggressive bowel regimen once ileus resolved -CT scan reviewed - agree w ileus.  No evidence of abscess/leak/peritonitis  Adin Hector, M.D., F.A.C.S. Gastrointestinal and Minimally Invasive Surgery [facility] [address] [place], [place] [address] [phone] Main / Paging   03/27/2015  Subjective:  Sore Anxious Narcotics helping but wears off Staying in bed  Objective:  Vital signs:  Filed Vitals:   03/26/15 1940 03/26/15 2235 03/27/15 0200 03/27/15 0556  BP:  114/64 132/86 119/73  Pulse:  99 91 103  Temp:  100 F (37.8 C) 98.1 F (36.7 C) 98.4 F (36.9 C)  TempSrc:  Oral Axillary Axillary  Resp:  _0 Height:      Weight:      SpO2: 96% 93% 94% 93%    Last BM Date: 03/22/15  Intake/Output   Yesterday:  07/31 0701 - 08/01 0700 In: 1460 [P.O.:120; I.V.:1310; NG/GT:30] Out: 1150 [Urine:300; Emesis/NG output:850] This shift:     Bowel function:  Flatus: Scant  BM: No  Drain: Thick green bilious  Physical Exam:  General: Pt awake/alert/oriented x4 in no acute distress Eyes: PERRL, normal EOM.  Sclera clear.  No icterus Neuro: CN II-XII intact w/o focal sensory/motor deficits. Lymph: No head/neck/groin lymphadenopathy Psych:  No delerium/psychosis/paranoia.  Anxious but consolable HENT: Normocephalic, Mucus membranes moist.  No thrush Neck:  Supple, No tracheal deviation Chest:  No chest wall pain w good excursion CV:  Pulses intact.  Regular rhythm MS: Normal AROM mjr joints.  No obvious deformity Abdomen: Soft.  Nondistended.  Mildly tender at incisions only.  No evidence of peritonitis.  No incarcerated hernias.  Binder removed given ileus.  Dressings removed - mild eccymosis Ext:  SCDs BLE.  No mjr edema.  No cyanosis Skin: No petechiae / purpura  Results:   Labs: Results for orders placed or performed during the hospital encounter of 03/26/15 (from the past 48 hour(s))  Comprehensive metabolic panel     Status: Abnormal   Collection Time: 03/26/15 12:30 PM  Result Value Ref Range   Sodium 139 135 - 145 mmol/L   Potassium 3.8 3.5 - 5.1 mmol/L   Chloride 103 101 - 111 mmol/L   CO2 26 22 - 32 mmol/L   Glucose, Bld 118 (H) 65 - 99 mg/dL   BUN 9 6 - 20 mg/dL   Creatinine, Ser 0.63 0.44 - 1.00 mg/dL   Calcium 9.8 8.9 - 10.3 mg/dL   Total Protein 6.7 6.5 - 8.1 g/dL   Albumin 3.3 (L) 3.5 - 5.0 g/dL   AST 17 15 - 41 U/L   ALT 14 14 - 54 U/L   Alkaline Phosphatase 62 38 - 126 U/L   Total Bilirubin 0.6 0.3 - 1.2 mg/dL   GFR calc non Af Amer >60 >60 mL/min   GFR calc Af Amer >60 >60 mL/min    Comment: (NOTE) The eGFR has been calculated using  the CKD EPI equation. This calculation has not been validated in all clinical situations. eGFR's persistently <60 mL/min signify possible Chronic Kidney Disease.    Anion gap 10 5 - 15  Lipase, blood     Status: Abnormal   Collection Time: 03/26/15 12:30 PM  Result Value Ref Range   Lipase 13 (L) 22 - 51 U/L  CBC with Differential     Status: Abnormal   Collection Time: 03/26/15 12:30 PM  Result Value Ref Range   WBC 19.0 (H) 4.0 - 10.5 K/uL   RBC 4.19 3.87 - 5.11 MIL/uL   Hemoglobin 12.6 12.0 - 15.0 g/dL   HCT 38.8 36.0 - 46.0 %   MCV 92.6 78.0 - 100.0 fL   MCH 30.1 26.0 - 34.0 pg   MCHC 32.5 30.0 - 36.0 g/dL   RDW 12.5 11.5 - 15.5 %   Platelets 361 150 - 400 K/uL    Neutrophils Relative % 78 (H) 43 - 77 %   Neutro Abs 14.9 (H) 1.7 - 7.7 K/uL   Lymphocytes Relative 14 12 - 46 %   Lymphs Abs 2.7 0.7 - 4.0 K/uL   Monocytes Relative 7 3 - 12 %   Monocytes Absolute 1.3 (H) 0.1 - 1.0 K/uL   Eosinophils Relative 1 0 - 5 %   Eosinophils Absolute 0.2 0.0 - 0.7 K/uL   Basophils Relative 0 0 - 1 %   Basophils Absolute 0.0 0.0 - 0.1 K/uL  I-Stat CG4 Lactic Acid, ED     Status: None   Collection Time: 03/26/15 12:44 PM  Result Value Ref Range   Lactic Acid, Venous 0.96 0.5 - 2.0 mmol/L  Urinalysis, Routine w reflex microscopic (not at [facility])     Status: Abnormal   Collection Time: 03/26/15  2:41 PM  Result Value Ref Range   Color, Urine YELLOW YELLOW   APPearance CLOUDY (A) CLEAR   Specific Gravity, Urine >1.046 (H) 1.005 - 1.030   pH 7.0 5.0 - 8.0   Glucose, UA NEGATIVE NEGATIVE mg/dL   Hgb urine dipstick MODERATE (A) NEGATIVE   Bilirubin Urine NEGATIVE NEGATIVE   Ketones, ur NEGATIVE NEGATIVE mg/dL   Protein, ur NEGATIVE NEGATIVE mg/dL   Urobilinogen, UA 1.0 0.0 - 1.0 mg/dL   Nitrite POSITIVE (A) NEGATIVE   Leukocytes, UA MODERATE (A) NEGATIVE  Urine microscopic-add on     Status: Abnormal   Collection Time: 03/26/15  2:41 PM  Result Value Ref Range   Squamous Epithelial / LPF MANY (A) RARE   WBC, UA TOO NUMEROUS TO COUNT <3 WBC/hpf   RBC / HPF 3-6 <3 RBC/hpf   Bacteria, UA MANY (A) RARE  Basic metabolic panel     Status: Abnormal   Collection Time: 03/27/15  5:00 AM  Result Value Ref Range   Sodium 140 135 - 145 mmol/L   Potassium 3.5 3.5 - 5.1 mmol/L   Chloride 106 101 - 111 mmol/L   CO2 27 22 - 32 mmol/L   Glucose, Bld 144 (H) 65 - 99 mg/dL   BUN 7 6 - 20 mg/dL   Creatinine, Ser 0.44 0.44 - 1.00 mg/dL   Calcium 8.7 (L) 8.9 - 10.3 mg/dL   GFR calc non Af Amer >60 >60 mL/min   GFR calc Af Amer >60 >60 mL/min    Comment: (NOTE) The eGFR has been calculated using the CKD EPI equation. This calculation has not been validated in all clinical  situations. eGFR's persistently <60 mL/min signify possible Chronic  Kidney Disease.    Anion gap 7 5 - 15  CBC     Status: Abnormal   Collection Time: 03/27/15  5:00 AM  Result Value Ref Range   WBC 14.4 (H) 4.0 - 10.5 K/uL   RBC 4.05 3.87 - 5.11 MIL/uL   Hemoglobin 12.4 12.0 - 15.0 g/dL   HCT 37.8 36.0 - 46.0 %   MCV 93.3 78.0 - 100.0 fL   MCH 30.6 26.0 - 34.0 pg   MCHC 32.8 30.0 - 36.0 g/dL   RDW 12.8 11.5 - 15.5 %   Platelets 327 150 - 400 K/uL    Imaging / Studies: Ct Abdomen Pelvis W Contrast  03/26/2015   CLINICAL DATA:  Abdominal pain, nausea and vomiting since this morning. Laparoscopic hernia repair on 03/23/2015  EXAM: CT ABDOMEN AND PELVIS WITH CONTRAST  TECHNIQUE: Multidetector CT imaging of the abdomen and pelvis was performed using the standard protocol following bolus administration of intravenous contrast.  CONTRAST:  116m OMNIPAQUE IOHEXOL 300 MG/ML  SOLN  COMPARISON:  CT 07/31/2014  FINDINGS: There is right base atelectasis. No pleural effusions. Heart is normal size.  Prior cholecystectomy. Liver, spleen, pancreas, adrenals and kidneys are unremarkable. No hydronephrosis.  Gas, fluid and stranding noted in the anterior subcutaneous soft tissues. This is presumably postoperative from recent hernia repair.  There is moderate free fluid in the pelvis. Uterus, adnexae a unremarkable. Small amount a gas in the urinary bladder, presumably from recent catheterization. There is mild diffuse gaseous distention of small bowel loops, presumably mild ileus. Diffuse colonic diverticulosis. No active diverticulitis. Aorta is normal caliber.  IMPRESSION: Midline gas and fluid collections in the anterior abdominal wall with surrounding stranding, presumably related to recent postop hernia repair.  Moderate free fluid in the cul-de-sac of the pelvis. There is generalized small bowel distention which likely reflects ileus.  Colonic diverticulosis.  Elevation of the right hemidiaphragm.  Right  base atelectasis.   Electronically Signed   By: KRolm BaptiseM.D.   On: 03/26/2015 13:48    Medications / Allergies: per chart  Antibiotics: Anti-infectives    None        Note: Portions of this report may have been transcribed using voice recognition software. Every effort was made to ensure accuracy; however, inadvertent computerized transcription errors may be present.   Any transcriptional errors that result from this process are unintentional.     SAdin Hector M.D., F.A.C.S. Gastrointestinal and Minimally Invasive Surgery [facility] [address] [place] [phone] / Paging   03/27/2015  CARE TEAM:  PCP: PLillard Anes MD  Outpatient Care Team: Patient Care Team: LLillard Anes MD as PCP - General (Family Medicine) JNewman Pies MD as Consulting Physician (Neurosurgery) RPark Liter MD as Consulting Physician (Cardiology) SMichael Boston MD as Consulting Physician (General Surgery)  Inpatient Treatment Team: Treatment Team: Attending Provider: SMichael Boston MD; Consulting Physician: SMichael Boston MD; Technician: April R Juarez, NT; Technician: BLeda Quail NT

## 2015-03-27 NOTE — Progress Notes (Signed)
Pt had nausea even after zofran and phenergan administration.  Per Lucia Gaskins, MD, placed NG tube.  Explained procedure to patient, patient agreed.  60F NG tube placed in left nare.  200ccs of thin bile green liquid immediatly returned.  NG tube then placed to low intermittent wall suction.  Pt tolerated procedure well.  Will continue to monitor.  Roselind Rily

## 2015-03-28 MED ORDER — POLYETHYLENE GLYCOL 3350 17 G PO PACK
17.0000 g | PACK | Freq: Two times a day (BID) | ORAL | Status: DC
  Administered 2015-03-28 – 2015-03-29 (×4): 17 g via ORAL
  Filled 2015-03-28 (×6): qty 1

## 2015-03-28 MED ORDER — LACTATED RINGERS IV BOLUS (SEPSIS): 1000.0000 mL | Freq: Three times a day (TID) | INTRAVENOUS | Status: AC | PRN

## 2015-03-28 MED ORDER — SACCHAROMYCES BOULARDII 250 MG PO CAPS
250.0000 mg | ORAL_CAPSULE | Freq: Two times a day (BID) | ORAL | Status: DC
  Administered 2015-03-28 – 2015-03-30 (×4): 250 mg via ORAL
  Filled 2015-03-28 (×6): qty 1

## 2015-03-28 MED ORDER — HYDROMORPHONE HCL 1 MG/ML IJ SOLN
1.0000 mg | INTRAMUSCULAR | Status: DC | PRN
  Administered 2015-03-28 – 2015-03-29 (×3): 2 mg via INTRAVENOUS
  Filled 2015-03-28 (×3): qty 2

## 2015-03-28 NOTE — Care Management Important Message (Signed)
Important Message  Patient Details  Name: [name] MRN: [id number] Date of Birth: 07-19-1956   Medicare Important Message Given:  Yes-second notification given    Camillo Flaming 03/28/2015, 2:40 [place] Message  Patient Details  Name: [name] MRN: [id number] Date of Birth: 16-Feb-1956   Medicare Important Message Given:  Yes-second notification given    Camillo Flaming 03/28/2015, 2:40 PM

## 2015-03-28 NOTE — Progress Notes (Signed)
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  D/c NGT later today if tolerates & adv to pureed -pain control - try to max nonnarcotic -anxiolysis -RAD/COPD Tx -VTE prophylaxis- SCDs, etc -mobilize as tolerated to help recovery.  GET HER UP! (She tends to want to stay in bed).  PT/OT evals -aggressive bowel regimen once ileus resolved -CT scan reviewed - agree w ileus.  No evidence of abscess/leak/peritonitis  Adin Hector, M.D., F.A.C.S. Gastrointestinal and Minimally Invasive Surgery [facility] [address] [place], [place] [address] [phone] Main / Paging   03/28/2015  Subjective:  Less sore Less anxious Narcotics helping ("I might try to get up today") Staying in bed  Objective:  Vital signs:  Filed Vitals:   03/27/15 1917 03/27/15 2147 03/28/15 0353 03/28/15 0539  BP:  124/65 128/67 117/72  Pulse:  83 77 71  Temp:  99.3 F (37.4 C) 98.3 F (36.8 C) 99.1 F (37.3 C)  TempSrc:  Axillary Oral Oral  Resp:  '16 16 14  ' Height:      Weight:      SpO2: 93% 95% 97% 95%    Last BM Date: 03/27/15  Intake/Output   Yesterday:  08/01 0701 - 08/02 0700 In: 3600 [I.V.:2400; NG/GT:100; IV Piggyback:1000] Out: 1600 [Urine:1300; Emesis/NG output:300] This shift:  Total I/O In: 1600 [I.V.:1600] Out: 850 [Urine:800; Emesis/NG output:50]  Bowel  function:  Flatus: YES  BM: Small w enema  Drain: Thin brown bilious  Physical Exam:  General: Pt awake/alert/oriented x4 in no acute distress Eyes: PERRL, normal EOM.  Sclera clear.  No icterus Neuro: CN II-XII intact w/o focal sensory/motor deficits. Lymph: No head/neck/groin lymphadenopathy Psych:  No delerium/psychosis/paranoia.  Anxious but consolable HENT: Normocephalic, Mucus membranes moist.  No thrush Neck: Supple, No tracheal deviation Chest:  No chest wall pain w good excursion CV:  Pulses intact.  Regular rhythm MS: Normal AROM mjr joints.  No obvious deformity Abdomen: Soft.  Nondistended.  Mildly tender at incisions only.  No evidence of peritonitis.  No incarcerated hernias.  Mild eccymosis Ext:  SCDs BLE.  No mjr edema.  No cyanosis Skin: No petechiae / purpura  Results:   Labs: Results for orders placed or performed during the hospital encounter of 03/26/15 (from the past 48 hour(s))  Comprehensive metabolic panel     Status: Abnormal   Collection Time: 03/26/15 12:30 PM  Result Value Ref Range   Sodium 139 135 - 145 mmol/L   Potassium 3.8 3.5 - 5.1 mmol/L   Chloride 103 101 - 111 mmol/L   CO2 26 22 - 32 mmol/L   Glucose, Bld 118 (H) 65 - 99 mg/dL   BUN 9 6 - 20 mg/dL   Creatinine, Ser 0.63 0.44 - 1.00 mg/dL   Calcium 9.8 8.9 - 10.3 mg/dL   Total Protein 6.7 6.5 - 8.1 g/dL   Albumin 3.3 (L) 3.5 - 5.0 g/dL  AST 17 15 - 41 U/L   ALT 14 14 - 54 U/L   Alkaline Phosphatase 62 38 - 126 U/L   Total Bilirubin 0.6 0.3 - 1.2 mg/dL   GFR calc non Af Amer >60 >60 mL/min   GFR calc Af Amer >60 >60 mL/min    Comment: (NOTE) The eGFR has been calculated using the CKD EPI equation. This calculation has not been validated in all clinical situations. eGFR's persistently <60 mL/min signify possible Chronic Kidney Disease.    Anion gap 10 5 - 15  Lipase, blood     Status: Abnormal   Collection Time: 03/26/15 12:30 PM  Result Value Ref Range   Lipase 13 (L) 22 - 51  U/L  CBC with Differential     Status: Abnormal   Collection Time: 03/26/15 12:30 PM  Result Value Ref Range   WBC 19.0 (H) 4.0 - 10.5 K/uL   RBC 4.19 3.87 - 5.11 MIL/uL   Hemoglobin 12.6 12.0 - 15.0 g/dL   HCT 38.8 36.0 - 46.0 %   MCV 92.6 78.0 - 100.0 fL   MCH 30.1 26.0 - 34.0 pg   MCHC 32.5 30.0 - 36.0 g/dL   RDW 12.5 11.5 - 15.5 %   Platelets 361 150 - 400 K/uL   Neutrophils Relative % 78 (H) 43 - 77 %   Neutro Abs 14.9 (H) 1.7 - 7.7 K/uL   Lymphocytes Relative 14 12 - 46 %   Lymphs Abs 2.7 0.7 - 4.0 K/uL   Monocytes Relative 7 3 - 12 %   Monocytes Absolute 1.3 (H) 0.1 - 1.0 K/uL   Eosinophils Relative 1 0 - 5 %   Eosinophils Absolute 0.2 0.0 - 0.7 K/uL   Basophils Relative 0 0 - 1 %   Basophils Absolute 0.0 0.0 - 0.1 K/uL  I-Stat CG4 Lactic Acid, ED     Status: None   Collection Time: 03/26/15 12:44 PM  Result Value Ref Range   Lactic Acid, Venous 0.96 0.5 - 2.0 mmol/L  Urinalysis, Routine w reflex microscopic (not at [facility])     Status: Abnormal   Collection Time: 03/26/15  2:41 PM  Result Value Ref Range   Color, Urine YELLOW YELLOW   APPearance CLOUDY (A) CLEAR   Specific Gravity, Urine >1.046 (H) 1.005 - 1.030   pH 7.0 5.0 - 8.0   Glucose, UA NEGATIVE NEGATIVE mg/dL   Hgb urine dipstick MODERATE (A) NEGATIVE   Bilirubin Urine NEGATIVE NEGATIVE   Ketones, ur NEGATIVE NEGATIVE mg/dL   Protein, ur NEGATIVE NEGATIVE mg/dL   Urobilinogen, UA 1.0 0.0 - 1.0 mg/dL   Nitrite POSITIVE (A) NEGATIVE   Leukocytes, UA MODERATE (A) NEGATIVE  Urine microscopic-add on     Status: Abnormal   Collection Time: 03/26/15  2:41 PM  Result Value Ref Range   Squamous Epithelial / LPF MANY (A) RARE   WBC, UA TOO NUMEROUS TO COUNT <3 WBC/hpf   RBC / HPF 3-6 <3 RBC/hpf   Bacteria, UA MANY (A) RARE  Basic metabolic panel     Status: Abnormal   Collection Time: 03/27/15  5:00 AM  Result Value Ref Range   Sodium 140 135 - 145 mmol/L   Potassium 3.5 3.5 - 5.1 mmol/L   Chloride 106 101 -  111 mmol/L   CO2 27 22 - 32 mmol/L   Glucose, Bld 144 (H) 65 - 99 mg/dL   BUN 7 6 - 20 mg/dL   Creatinine, Ser 0.44 0.44 -  1.00 mg/dL   Calcium 8.7 (L) 8.9 - 10.3 mg/dL   GFR calc non Af Amer >60 >60 mL/min   GFR calc Af Amer >60 >60 mL/min    Comment: (NOTE) The eGFR has been calculated using the CKD EPI equation. This calculation has not been validated in all clinical situations. eGFR's persistently <60 mL/min signify possible Chronic Kidney Disease.    Anion gap 7 5 - 15  CBC     Status: Abnormal   Collection Time: 03/27/15  5:00 AM  Result Value Ref Range   WBC 14.4 (H) 4.0 - 10.5 K/uL   RBC 4.05 3.87 - 5.11 MIL/uL   Hemoglobin 12.4 12.0 - 15.0 g/dL   HCT 37.8 36.0 - 46.0 %   MCV 93.3 78.0 - 100.0 fL   MCH 30.6 26.0 - 34.0 pg   MCHC 32.8 30.0 - 36.0 g/dL   RDW 12.8 11.5 - 15.5 %   Platelets 327 150 - 400 K/uL    Imaging / Studies: Ct Abdomen Pelvis W Contrast  03/26/2015   CLINICAL DATA:  Abdominal pain, nausea and vomiting since this morning. Laparoscopic hernia repair on 03/23/2015  EXAM: CT ABDOMEN AND PELVIS WITH CONTRAST  TECHNIQUE: Multidetector CT imaging of the abdomen and pelvis was performed using the standard protocol following bolus administration of intravenous contrast.  CONTRAST:  174m OMNIPAQUE IOHEXOL 300 MG/ML  SOLN  COMPARISON:  CT 07/31/2014  FINDINGS: There is right base atelectasis. No pleural effusions. Heart is normal size.  Prior cholecystectomy. Liver, spleen, pancreas, adrenals and kidneys are unremarkable. No hydronephrosis.  Gas, fluid and stranding noted in the anterior subcutaneous soft tissues. This is presumably postoperative from recent hernia repair.  There is moderate free fluid in the pelvis. Uterus, adnexae a unremarkable. Small amount a gas in the urinary bladder, presumably from recent catheterization. There is mild diffuse gaseous distention of small bowel loops, presumably mild ileus. Diffuse colonic diverticulosis. No active  diverticulitis. Aorta is normal caliber.  IMPRESSION: Midline gas and fluid collections in the anterior abdominal wall with surrounding stranding, presumably related to recent postop hernia repair.  Moderate free fluid in the cul-de-sac of the pelvis. There is generalized small bowel distention which likely reflects ileus.  Colonic diverticulosis.  Elevation of the right hemidiaphragm.  Right base atelectasis.   Electronically Signed   By: KRolm BaptiseM.D.   On: 03/26/2015 13:48   Dg Abd 2 Views  03/27/2015   CLINICAL DATA:  Ileus following GI surgery.  EXAM: ABDOMEN - 2 VIEW  COMPARISON:  CT of the abdomen and pelvis on 03/26/2015  FINDINGS: There is mild distension of small bowel loops. There is no evidence for free intraperitoneal air. Large bowel loops contain gas and stool and appear normal in caliber. Patient has a nasogastric tube, coiled in the region of the stomach.  IMPRESSION: Findings are consistent with persistent ileus compare with previous CT exam.   Electronically Signed   By: ENolon NationsM.D.   On: 03/27/2015 09:46    Medications / Allergies: per chart  Antibiotics: Anti-infectives    None        Note: Portions of this report may have been transcribed using voice recognition software. Every effort was made to ensure accuracy; however, inadvertent computerized transcription errors may be present.   Any transcriptional errors that result from this process are unintentional.     SAdin Hector M.D., F.A.C.S. Gastrointestinal and Minimally Invasive Surgery [facility] [address]  [place], [place] [address] [phone] Main / Paging   03/28/2015  CARE TEAM:  PCP: Lillard Anes, MD  Outpatient Care Team: Patient Care Team: Lillard Anes, MD as PCP - General (Family Medicine) Newman Pies, MD as Consulting Physician (Neurosurgery) Park Liter, MD as Consulting Physician (Cardiology) Michael Boston,  MD as Consulting Physician (General Surgery)  Inpatient Treatment Team: Treatment Team: Attending Provider: Michael Boston, MD; Consulting Physician: Michael Boston, MD; Technician: Leda Quail, NT

## 2015-03-28 NOTE — Evaluation (Signed)
Occupational Therapy Evaluation Patient Details Name: [name] MRN: [id number] DOB: [birth year] Today's Date: 03/28/2015    History of Present Illness 59 y.o. female with h/o cervical fusion, COPD, CHF admitted with ileus following hernia repair7/28/16.    Clinical Impression   Pt admitted with above. [name] demonstrates the below listed deficits and will benefit from continued OT to maximize safety and independence with BADLs.  [name] demonstrates generalized weakness.  [name] will benefit from use of AE to increase independence with ADLs.  Will follow.       Follow Up Recommendations  No OT follow up;Supervision - Intermittent    Equipment Recommendations  Tub/shower bench;3 in 1 bedside comode    Recommendations for Other Services       Precautions / Restrictions Precautions Precautions: Fall Precaution Comments: 5 falls in past year; abdominal incision      Mobility Bed Mobility Overal bed mobility: Modified Independent             General bed mobility comments: used bedrail to push up  Transfers Overall transfer level: Needs assistance Equipment used: Rolling walker (2 wheeled) Transfers: Sit to/from [facility] Sit to Stand: Supervision;Min guard Stand pivot transfers: Min guard       General transfer comment: cues for hand placement, min guard for safety    Balance Overall balance assessment: Needs assistance Sitting-balance support: Feet supported Sitting balance-Leahy Scale: Good     Standing balance support: During functional activity Standing balance-Leahy Scale: Fair                              ADL Overall ADL's : Needs assistance/impaired Eating/Feeding: Independent;Sitting   Grooming: Wash/dry hands;Wash/dry face;Oral care;Brushing hair;Supervision/safety;Standing   Upper Body Bathing: Set up;Sitting   Lower Body Bathing: Min guard;Sit to/from stand   Upper Body Dressing : Set up;Sitting   Lower Body  Dressing: Min guard;Sit to/from stand Lower Body Dressing Details (indicate cue type and reason): Pt was able to cross ankles over [name] knees to don/doff socks.  [name] reports that [name] ability to do this at home is variable Armed forces technical officer: Min guard;Ambulation;Comfort height toilet;RW   Toileting- Clothing Manipulation and Hygiene: Sit to/from stand;Supervision/safety       Functional mobility during ADLs: Herbalist     Praxis      Pertinent Vitals/Pain Pain Assessment: 0-10 Pain Score: 7  Pain Location: Rt Lower abdomen Pain Descriptors / Indicators: Sharp Pain Intervention(s): Monitored during session     Hand Dominance     Extremity/Trunk Assessment Upper Extremity Assessment Upper Extremity Assessment: Generalized weakness;RUE deficits/detail;LUE deficits/detail RUE Deficits / Details: Pt with shoulder flexion 120-130* bil.  [name] is able to rub the top of [name] head, but reports that [name] has trouble doing this at home in the shower.  LUE Deficits / Details: Pt with shoulder flexion 120-130* bil.  [name] is able to rub the top of [name] head, but reports that [name] has trouble doing this at home in the shower.    Lower Extremity Assessment Lower Extremity Assessment: Defer to PT evaluation   Cervical / Trunk Assessment Cervical / Trunk Assessment: Kyphotic   Communication Communication Communication: No difficulties   Cognition Arousal/Alertness: Awake/alert Behavior During Therapy: WFL for tasks assessed/performed Overall Cognitive Status: Within Functional Limits for tasks assessed  General Comments       Exercises       Shoulder Instructions      Home Living Family/patient expects to be discharged to:: Private residence Living Arrangements: Spouse/significant other Available Help at Discharge: Available PRN/intermittently ("friend" works 4am-8pm)   Home Access: Stairs to enter Engineer, site of Steps: 4 Entrance Stairs-Rails: Right;Left Home Layout: One level     Bathroom Shower/Tub: Tub/shower unit;Curtain Shower/tub characteristics: Architectural technologist: [place]: Other (comment) (oxygen tank for night)          Prior Functioning/Environment Level of Independence: Needs assistance  Gait / Transfers Assistance Needed: assist for stepping over edge of tub to stand for shower ADL's / Homemaking Assistance Needed: Pt reports [name] requires assist to step over top of shower.  [name] states [name] has difficulty reaching the top of [name] head with [name] hand to rinse [name] hair, and reports that [name] 46 y.o grand son has to assist [name] with donning socks and tying shoes.         OT Diagnosis: Generalized weakness;Acute pain   OT Problem List: Decreased strength;Decreased activity tolerance;Impaired balance (sitting and/or standing);Decreased safety awareness;Decreased knowledge of use of DME or AE;Pain   OT Treatment/Interventions: Self-care/ADL training;DME and/or AE instruction;Therapeutic activities;Patient/family education;Balance training    OT Goals(Current goals can be found in the care plan section) Acute Rehab OT Goals Patient Stated Goal: To be able to eat OT Goal Formulation: With patient Time For Goal Achievement: 04/11/15 Potential to Achieve Goals: Good ADL Goals Pt Will Perform Grooming: with modified independence;standing Pt Will Perform Upper Body Bathing: with modified independence;sitting Pt Will Perform Lower Body Bathing: with modified independence;sit to/from stand;with adaptive equipment Pt Will Perform Upper Body Dressing: with modified independence;sitting Pt Will Perform Lower Body Dressing: with modified independence;with adaptive equipment;sit to/from stand Pt Will Transfer to Toilet: with modified independence;ambulating;regular height toilet;bedside commode;grab bars Pt Will Perform Toileting - Clothing Manipulation  and hygiene: with modified independence;sit to/from stand Pt Will Perform Tub/Shower Transfer: Tub transfer;with modified independence;ambulating;tub bench;rolling walker  OT Frequency: Min 2X/week   Barriers to D/C: Decreased caregiver support          Co-evaluation              End of Session Equipment Utilized During Treatment: Rolling walker Nurse Communication: Mobility status  Activity Tolerance: Patient tolerated treatment well Patient left: in bed;with call bell/phone within reach   Time: [id number] OT Time Calculation (min): 22 min Charges:  OT General Charges $OT Visit: 1 Procedure OT Evaluation $Initial OT Evaluation Tier I: 1 Procedure G-Codes:    Lucille Passy M 04-17-2015, 3:37 PM

## 2015-03-28 NOTE — Progress Notes (Signed)
120cc air inserted through both ports of NG tube q4h hours this shift.  Minimal drng noted in NG suction container.  No voiced complaints of nausea.  Pt states she passed some flatus; faint bowel sounds  auscultated

## 2015-03-28 NOTE — Evaluation (Signed)
Physical Therapy Evaluation Patient Details Name: [name] MRN: [id number] DOB: 30-Jun-1956 Today's Date: 03/28/2015   History of Present Illness  59 y.o. female with h/o cervical fusion, COPD, CHF admitted with ileus following hernia repair7/28/16.   Clinical Impression  Pt admitted with above diagnosis. Pt currently with functional limitations due to the deficits listed below (see PT Problem List). Pt ambulated 180' with RW and min/guard assist with 8/10 pain. Recommended use of RW at home as she's had 5 falls in past year. HHPT recommended for home safety eval. Pt will benefit from skilled PT to increase their independence and safety with mobility to allow discharge to the venue listed below.       Follow Up Recommendations Home health PT    Equipment Recommendations  Rolling walker with 5" wheels    Recommendations for Other Services       Precautions / Restrictions Precautions Precautions: Fall Precaution Comments: 5 falls in past year; abdominal incision Restrictions Weight Bearing Restrictions: No      Mobility  Bed Mobility Overal bed mobility: Modified Independent             General bed mobility comments: used bedrail to push up  Transfers Overall transfer level: Needs assistance Equipment used: Rolling walker (2 wheeled) Transfers: Sit to/from Stand Sit to Stand: Min guard         General transfer comment: cues for hand placement, min guard for safety  Ambulation/Gait Ambulation/Gait assistance: Min guard Ambulation Distance (Feet): 180 Feet Assistive device: Rolling walker (2 wheeled) Gait Pattern/deviations: Step-through pattern;Trunk flexed   Gait velocity interpretation: Below normal speed for age/gender General Gait Details: pt reports she flexes trunk due to her "hips and knees are bad and they give out", noted 1 episode of LLE buckling which pt was able to self correct for using RW  Stairs            Wheelchair Mobility     Modified Rankin (Stroke Patients Only)       Balance Overall balance assessment: Needs assistance;History of Falls Sitting-balance support: Feet unsupported;Single extremity supported Sitting balance-Leahy Scale: Good       Standing balance-Leahy Scale: Fair                               Pertinent Vitals/Pain Pain Assessment: 0-10 Pain Score: 8  Pain Location: R lower abdomen Pain Descriptors / Indicators: Sharp Pain Intervention(s): Limited activity within patient's tolerance;Monitored during session;Premedicated before session;Other (comment) (donned abdominal binder, instructed pt to brace pillow when coughing)    Home Living Family/patient expects to be discharged to:: Private residence Living Arrangements: Spouse/significant other Available Help at Discharge: Available PRN/intermittently ("friend" works 4am-8pm)   Home Access: Stairs to enter Entrance Stairs-Rails: Psychiatric nurse of Steps: [place]: [place]: Other (comment) (oxygen tank for night)      Prior Function Level of Independence: Needs assistance   Gait / Transfers Assistance Needed: assist for stepping over edge of tub to stand for shower           Hand Dominance        Extremity/Trunk Assessment   Upper Extremity Assessment: Defer to OT evaluation (pt reports difficulty reaching overhead due to fused neck; functional for tasks assessed in PT eval)           Lower Extremity Assessment: Generalized weakness (-4/5 B knee extension; sensation decr to light touch B feet  laterally)      Cervical / Trunk Assessment: Kyphotic  Communication   Communication: No difficulties  Cognition Arousal/Alertness: Awake/alert Behavior During Therapy: WFL for tasks assessed/performed Overall Cognitive Status: Within Functional Limits for tasks assessed                      General Comments      Exercises General Exercises - Lower  Extremity Ankle Circles/Pumps: AROM;Both;10 reps;Supine      Assessment/Plan    PT Assessment Patient needs continued PT services  PT Diagnosis Generalized weakness   PT Problem List Decreased strength;Decreased activity tolerance;Decreased balance;Decreased knowledge of use of DME;Decreased mobility;Pain;Impaired sensation  PT Treatment Interventions Gait training;Stair training;Functional mobility training;Therapeutic activities;Patient/family education;Balance training;Therapeutic exercise;DME instruction   PT Goals (Current goals can be found in the Care Plan section) Acute Rehab PT Goals Patient Stated Goal: to sit around the house with grandson PT Goal Formulation: With patient Time For Goal Achievement: 04/11/15 Potential to Achieve Goals: Good    Frequency Min 3X/week   Barriers to discharge        Co-evaluation               End of Session Equipment Utilized During Treatment: Other (comment) (abdominal binder) Activity Tolerance: Patient tolerated treatment well Patient left: in chair;with call bell/phone within reach Nurse Communication: Mobility status         Time: [id number] PT Time Calculation (min) (ACUTE ONLY): 27 min   Charges:   PT Evaluation $Initial PT Evaluation Tier I: 1 Procedure PT Treatments $Gait Training: 8-22 mins   PT G Codes:        Philomena Doheny 03/28/2015, 12:45 PM [phone]

## 2015-03-29 MED ORDER — ASPIRIN 81 MG PO CHEW
81.0000 mg | CHEWABLE_TABLET | Freq: Every day | ORAL | Status: DC
  Administered 2015-03-29 – 2015-03-30 (×2): 81 mg via ORAL
  Filled 2015-03-29 (×2): qty 1

## 2015-03-29 MED ORDER — NAPROXEN 500 MG PO TABS: 500.0000 mg | ORAL_TABLET | Freq: Two times a day (BID) | ORAL | Status: DC

## 2015-03-29 MED ORDER — NAPROXEN 500 MG PO TABS
500.0000 mg | ORAL_TABLET | Freq: Three times a day (TID) | ORAL | Status: DC
  Administered 2015-03-29 – 2015-03-30 (×4): 500 mg via ORAL
  Filled 2015-03-29 (×7): qty 1

## 2015-03-29 MED ORDER — METHOCARBAMOL 500 MG PO TABS: 1000.0000 mg | ORAL_TABLET | Freq: Four times a day (QID) | ORAL | Status: DC | PRN

## 2015-03-29 MED ORDER — DIAZEPAM 5 MG PO TABS: 5.0000 mg | ORAL_TABLET | Freq: Three times a day (TID) | ORAL | Status: DC | PRN

## 2015-03-29 MED ORDER — ROSUVASTATIN CALCIUM 10 MG PO TABS
10.0000 mg | ORAL_TABLET | Freq: Every day | ORAL | Status: DC
  Administered 2015-03-29 – 2015-03-30 (×2): 10 mg via ORAL
  Filled 2015-03-29 (×2): qty 1

## 2015-03-29 MED ORDER — CARVEDILOL 3.125 MG PO TABS
3.1250 mg | ORAL_TABLET | Freq: Two times a day (BID) | ORAL | Status: DC
  Administered 2015-03-29 – 2015-03-30 (×2): 3.125 mg via ORAL
  Filled 2015-03-29 (×5): qty 1

## 2015-03-29 MED ORDER — OXYCODONE HCL 5 MG PO TABS: 5.0000 mg | ORAL_TABLET | ORAL | Status: DC | PRN

## 2015-03-29 MED ORDER — ACETAMINOPHEN 325 MG PO TABS: 325.0000 mg | ORAL_TABLET | Freq: Four times a day (QID) | ORAL | Status: DC | PRN

## 2015-03-29 MED ORDER — LISINOPRIL 10 MG PO TABS
10.0000 mg | ORAL_TABLET | Freq: Every day | ORAL | Status: DC
  Administered 2015-03-29 – 2015-03-30 (×2): 10 mg via ORAL
  Filled 2015-03-29 (×2): qty 1

## 2015-03-29 MED ORDER — HYDROCODONE-ACETAMINOPHEN 10-325 MG PO TABS
1.0000 | ORAL_TABLET | ORAL | Status: DC | PRN
  Administered 2015-03-29 – 2015-03-30 (×4): 2 via ORAL
  Filled 2015-03-29 (×4): qty 2

## 2015-03-29 MED ORDER — POTASSIUM CHLORIDE CRYS ER 10 MEQ PO TBCR
10.0000 meq | EXTENDED_RELEASE_TABLET | Freq: Every day | ORAL | Status: DC
  Administered 2015-03-29 – 2015-03-30 (×2): 10 meq via ORAL
  Filled 2015-03-29 (×2): qty 1

## 2015-03-29 MED ORDER — POLYETHYLENE GLYCOL 3350 17 G PO PACK: 17.0000 g | PACK | Freq: Two times a day (BID) | ORAL | Status: DC | PRN

## 2015-03-29 MED ORDER — ISOSORBIDE MONONITRATE ER 30 MG PO TB24
30.0000 mg | ORAL_TABLET | Freq: Every day | ORAL | Status: DC
  Administered 2015-03-29 – 2015-03-30 (×2): 30 mg via ORAL
  Filled 2015-03-29 (×2): qty 1

## 2015-03-29 NOTE — Progress Notes (Signed)
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  Inc PO -anxiolysis -RAD/COPD Tx -VTE prophylaxis- SCDs, etc -mobilize as tolerated to help recovery.  GET HER UP! (She tends to want to stay in bed).  PT/OT evals -aggressive bowel regimen once ileus resolved -CT scan reviewed - agree w ileus.  No evidence of abscess/leak/peritonitis  Adin Hector, M.D., F.A.C.S. Gastrointestinal and Minimally Invasive Surgery [facility] [address] [place], [place] [address] [phone] Main / Paging   03/29/2015  Subjective:  Less sore Less anxious Narcotics helping  Walked a little in hallways Tol NGT clamping - NGT out - tol clears  Objective:  Vital signs:  Filed Vitals:   03/28/15 1757 03/28/15 1911 03/28/15 2139 03/29/15 0550  BP: 121/57  110/60 110/63  Pulse: 85  73 69  Temp:   99.4 F (37.4 C) 98.7 F (37.1 C)  TempSrc:   Oral Oral  Resp:   18 18  Height:      Weight:      SpO2:  93% 98% 96%    Last BM Date: 03/27/15  Intake/Output   Yesterday:  08/02 0701 - 08/03 0700 In: 2234.2 [P.O.:1080; I.V.:1054.2; NG/GT:50] Out: 600 [Urine:600] This shift:  Total I/O In: 1420 [P.O.:720; I.V.:600; Other:50; NG/GT:50] Out: 300 [Urine:300]  Bowel function:  Flatus: YES  BM: No  Drain: N/A  Physical Exam:  General: Pt  awake/alert/oriented x4 in no acute distress Eyes: PERRL, normal EOM.  Sclera clear.  No icterus Neuro: CN II-XII intact w/o focal sensory/motor deficits. Lymph: No head/neck/groin lymphadenopathy Psych:  No delerium/psychosis/paranoia.  Anxious but consolable HENT: Normocephalic, Mucus membranes moist.  No thrush Neck: Supple, No tracheal deviation Chest:  No chest wall pain w good excursion CV:  Pulses intact.  Regular rhythm MS: Normal AROM mjr joints.  No obvious deformity Abdomen: Soft.  Nondistended.  Mildly tender at incisions only.  No evidence of peritonitis.  No incarcerated hernias.  Mild eccymosis Ext:  SCDs BLE.  No mjr edema.  No cyanosis Skin: No petechiae / purpura  Results:   Labs: No results found for this or any previous visit (from the past 48 hour(s)).  Imaging / Studies: Dg Abd 2 Views  03/27/2015   CLINICAL DATA:  Ileus following GI surgery.  EXAM: ABDOMEN - 2 VIEW  COMPARISON:  CT of the abdomen and pelvis on 03/26/2015  FINDINGS: There is mild distension of small bowel loops. There is no evidence for free intraperitoneal air. Large bowel loops contain gas and stool and appear normal in caliber. Patient has a nasogastric tube, coiled in the region of the stomach.  IMPRESSION: Findings are consistent with persistent ileus compare with previous CT exam.   Electronically Signed   By: Nolon Nations M.D.   On: 03/27/2015 09:46    Medications / Allergies: per chart  Antibiotics: Anti-infectives    None  Note: Portions of this report may have been transcribed using voice recognition software. Every effort was made to ensure accuracy; however, inadvertent computerized transcription errors may be present.   Any transcriptional errors that result from this process are unintentional.     Adin Hector, M.D., F.A.C.S. Gastrointestinal and Minimally Invasive Surgery [facility] [address] [place], [place]  [address] [phone] Main / Paging   03/29/2015  CARE TEAM:  PCP: Lillard Anes, MD  Outpatient Care Team: Patient Care Team: Lillard Anes, MD as PCP - General (Family Medicine) Newman Pies, MD as Consulting Physician (Neurosurgery) Park Liter, MD as Consulting Physician (Cardiology) Michael Boston, MD as Consulting Physician (General Surgery)  Inpatient Treatment Team: Treatment Team: Attending Provider: Michael Boston, MD; Consulting Physician: Michael Boston, MD; Technician: Leda Quail, NT; Registered Nurse: Vilma Meckel, RN

## 2015-03-29 NOTE — Progress Notes (Signed)
Occupational Therapy Treatment Patient Details Name: [name] MRN: [id number] DOB: [birth year] Today's Date: 03/29/2015    History of present illness 59 y.o. female with h/o cervical fusion, COPD, CHF admitted with ileus following hernia repair7/28/16.    OT comments  Pt fatiqued after ambulating with NT but agreeable to OT.  Educated on and issued reacher and sock aide to minimize abdominal discomfort during adls.    Follow Up Recommendations  No OT follow up;Supervision - Intermittent    Equipment Recommendations  Tub/shower bench;3 in 1 bedside comode    Recommendations for Other Services      Precautions / Restrictions Precautions Precautions: Fall Precaution Comments: 5 falls in past year; abdominal incision Restrictions Weight Bearing Restrictions: No       Mobility Bed Mobility Overal bed mobility: Modified Independent                Transfers   Equipment used: Rolling walker (2 wheeled)   Sit to Stand: Supervision              Balance                                   ADL                       Lower Body Dressing: Supervision/safety;Sit to/from stand;With adaptive equipment                 General ADL Comments: Pt had just returned from walking with NT.  Educated on Public affairs consultant, and pt used them for socks.  Did not want to complete ADL at this time.  Showed photo of tub bench:  she has difficulty lifting legs over tub and she uses sink and planter to get up from toilet.  She would benefit from both tub bench and 3:1 commode      Vision                     Perception     Praxis      Cognition   Behavior During Therapy: [facility] for tasks assessed/performed                         Extremity/Trunk Assessment               Exercises     Shoulder Instructions       General Comments      Pertinent Vitals/ Pain       Pain Score: 5  Pain Location: abdomen Pain  Descriptors / Indicators: Sore Pain Intervention(s): Limited activity within patient's tolerance;Monitored during session;Repositioned  Home Living                                          Prior Functioning/Environment              Frequency Min 2X/week     Progress Toward Goals  OT Goals(current goals can now be found in the care plan section)  Progress towards OT goals: Progressing toward goals     Plan      Co-evaluation                 End of Session     Activity Tolerance Patient  tolerated treatment well   Patient Left in bed;with call bell/phone within reach   Nurse Communication          Time: [id number] OT Time Calculation (min): 14 min  Charges: OT General Charges $OT Visit: 1 Procedure OT Treatments $Self Care/Home Management : 8-22 mins  Zen Cedillos 03/29/2015, 10:42 AM  Lesle Chris, OTR/L [phone] 03/29/2015

## 2015-03-29 NOTE — Progress Notes (Signed)
Physical Therapy Treatment Patient Details Name: [name] MRN: [id number] DOB: Jun 17, 1956 Today's Date: 06-Apr-2015    History of Present Illness 59 y.o. female with h/o cervical fusion, COPD, CHF admitted with ileus following hernia repair 03/23/15.     PT Comments    Pt ambulated in hallway and reports overall feeling better.  Follow Up Recommendations  Home health PT     Equipment Recommendations  Rolling walker with 5" wheels    Recommendations for Other Services       Precautions / Restrictions Precautions Precautions: Fall Precaution Comments: 5 falls in past year; abdominal incision Required Braces or Orthoses: Other Brace/Splint Other Brace/Splint: has abdominal binder in room    Mobility  Bed Mobility Overal bed mobility: Modified Independent                Transfers Overall transfer level: Needs assistance Equipment used: Rolling walker (2 wheeled) Transfers: Sit to/from Stand Sit to Stand: Supervision         General transfer comment: verbal cues for hand placement  Ambulation/Gait Ambulation/Gait assistance: Min guard Ambulation Distance (Feet): 180 Feet Assistive device: Rolling walker (2 wheeled) Gait Pattern/deviations: Step-through pattern;Trunk flexed     General Gait Details: increased trunk flexion, cues for posture, RW positioning   Stairs            Wheelchair Mobility    Modified Rankin (Stroke Patients Only)       Balance                                    Cognition Arousal/Alertness: Awake/alert Behavior During Therapy: WFL for tasks assessed/performed Overall Cognitive Status: Within Functional Limits for tasks assessed                      Exercises      General Comments        Pertinent Vitals/Pain Pain Assessment: 0-10 Pain Score: 3  Pain Location: abdomen Pain Descriptors / Indicators: Sore Pain Intervention(s): Limited activity within patient's tolerance;Monitored  during session;Repositioned    Home Living                      Prior Function            PT Goals (current goals can now be found in the care plan section) Progress towards PT goals: Progressing toward goals    Frequency  Min 3X/week    PT Plan Current plan remains appropriate    Co-evaluation             End of Session Equipment Utilized During Treatment: Other (comment) (abdominal binder) Activity Tolerance: Patient tolerated treatment well Patient left: with call bell/phone within reach;in bed     Time: [id number] PT Time Calculation (min) (ACUTE ONLY): 9 min  Charges:  $Gait Training: 8-22 mins                    G Codes:      Nura Cahoon,KATHrine E 2015-04-06, 4:26 PM Carmelia Bake, PT, DPT 2015/04/06 Pager: [phone]

## 2015-03-30 MED ORDER — POLYETHYLENE GLYCOL 3350 17 G PO PACK
34.0000 g | PACK | Freq: Once | ORAL | Status: AC
  Administered 2015-03-30: 34 g via ORAL

## 2015-03-30 MED ORDER — POLYETHYLENE GLYCOL 3350 17 G PO PACK: 34.0000 g | PACK | Freq: Once | ORAL | Status: DC | PRN

## 2015-03-30 MED ORDER — HYDROCODONE-ACETAMINOPHEN 10-325 MG PO TABS: 1.0000 | ORAL_TABLET | ORAL | Status: DC | PRN

## 2015-03-30 MED ORDER — NAPROXEN 500 MG PO TABS: 500.0000 mg | ORAL_TABLET | Freq: Two times a day (BID) | ORAL | Status: DC

## 2015-03-30 NOTE — Discharge Instructions (Signed)
HERNIA REPAIR: POST OP INSTRUCTIONS ° °1. DIET: Follow a light bland diet the first 24 hours after arrival home, such as soup, liquids, crackers, etc.  Be sure to include lots of fluids daily.  Avoid fast food or heavy meals as your are more likely to get nauseated.  Eat a low fat the next few days after surgery. °2. Take your usually prescribed home medications unless otherwise directed. °3. PAIN CONTROL: °a. Pain is best controlled by a usual combination of three different methods TOGETHER: °i. Ice/Heat °ii. Over the counter pain medication °iii. Prescription pain medication °b. Most patients will experience some swelling and bruising around the hernia(s) such as the bellybutton, groins, or old incisions.  Ice packs or heating pads (30-60 minutes up to 6 times a day) will help. Use ice for the first few days to help decrease swelling and bruising, then switch to heat to help relax tight/sore spots and speed recovery.  Some people prefer to use ice alone, heat alone, alternating between ice & heat.  Experiment to what works for you.  Swelling and bruising can take several weeks to resolve.   °c. It is helpful to take an over-the-counter pain medication regularly for the first few weeks.  Choose one of the following that works best for you: °i. Naproxen (Aleve, etc)  Two 220mg tabs twice a day °ii. Ibuprofen (Advil, etc) Three 200mg tabs four times a day (every meal & bedtime) °iii. Acetaminophen (Tylenol, etc) 325-650mg four times a day (every meal & bedtime) °d. A  prescription for pain medication should be given to you upon discharge.  Take your pain medication as prescribed.  °i. If you are having problems/concerns with the prescription medicine (does not control pain, nausea, vomiting, rash, itching, etc), please call us [phone] to see if we need to switch you to a different pain medicine that will work better for you and/or control your side effect better. °ii. If you need a refill on your pain  medication, please contact your pharmacy.  They will contact our office to request authorization. Prescriptions will not be filled after 5 pm or on week-ends. °4. Avoid getting constipated.  Between the surgery and the pain medications, it is common to experience some constipation.  Increasing fluid intake and taking a fiber supplement (such as Metamucil, Citrucel, FiberCon, MiraLax, etc) 1-2 times a day regularly will usually help prevent this problem from occurring.  A mild laxative (prune juice, Milk of Magnesia, MiraLax, etc) should be taken according to package directions if there are no bowel movements after 48 hours.   °5. Wash / shower every day.  You may shower over the dressings as they are waterproof.   °6. Remove your waterproof bandages 5 days after surgery.  You may leave the incision open to air.  You may replace a dressing/Band-Aid to cover the incision for comfort if you wish.  Continue to shower over incision(s) after the dressing is off. ° ° ° °7. ACTIVITIES as tolerated:   °a. You may resume regular (light) daily activities beginning the next day--such as daily self-care, walking, climbing stairs--gradually increasing activities as tolerated.  If you can walk 30 minutes without difficulty, it is safe to try more intense activity such as jogging, treadmill, bicycling, low-impact aerobics, swimming, etc. °b. Save the most intensive and strenuous activity for last such as sit-ups, heavy lifting, contact sports, etc  Refrain from any heavy lifting or straining until you are off narcotics for pain control.   °  c. DO NOT PUSH THROUGH PAIN.  Let pain be your guide: If it hurts to do something, don't do it.  Pain is your body warning you to avoid that activity for another week until the pain goes down. d. You may drive when you are no longer taking prescription pain medication, you can comfortably wear a seatbelt, and you can safely maneuver your car and apply brakes. e. [name] may have sexual intercourse  when it is comfortable.  8. FOLLOW UP in our office a. Please call [facility] at [phone] to set up an appointment to see your surgeon in the office for a follow-up appointment approximately 2-3 weeks after your surgery. b. Make sure that you call for this appointment the day you arrive home to insure a convenient appointment time. 9.  IF YOU HAVE DISABILITY OR FAMILY LEAVE FORMS, BRING THEM TO THE OFFICE FOR PROCESSING.  DO NOT GIVE THEM TO YOUR DOCTOR.  WHEN TO CALL us [phone]: 1. Poor pain control 2. Reactions / problems with new medications (rash/itching, nausea, etc)  3. Fever over 101.5 F (38.5 C) 4. Inability to urinate 5. Nausea and/or vomiting 6. Worsening swelling or bruising 7. Continued bleeding from incision. 8. Increased pain, redness, or drainage from the incision   The clinic staff is available to answer your questions during regular business hours (8:30am-5pm).  Please dont hesitate to call and ask to speak to one of our nurses for clinical concerns.   If you have a medical emergency, go to the nearest emergency room or call 911.  A surgeon from [facility] is always on call at the hospitals in [facility], [place], [place], [place], [place]  [address] ?  [address] [address], [place], [place]   [address] MAIN: [phone] ? TOLL FREE: [phone] ? FAX: [phone] [email or web address]  GETTING TO GOOD BOWEL HEALTH. Irregular bowel habits such as constipation and diarrhea can lead to many problems over time.  Having one soft bowel movement a day is the most important way to prevent further problems.  The anorectal canal is designed to handle stretching and feces to safely manage our ability to get rid of solid waste (feces, poop, stool) out of our body.  BUT, hard constipated stools can act like ripping concrete bricks and diarrhea can be a burning fire to this very sensitive area of our body, causing  inflamed hemorrhoids, anal fissures, increasing risk is perirectal abscesses, abdominal pain/bloating, an making irritable bowel worse.      The goal: ONE SOFT BOWEL MOVEMENT A DAY!  To have soft, regular bowel movements:   Drink plenty of fluids, consider 4-6 tall glasses of water a day.    Take plenty of fiber.  Fiber is the undigested part of plant food that passes into the colon, acting s natures broom to encourage bowel motility and movement.  Fiber can absorb and hold large amounts of water. This results in a larger, bulkier stool, which is soft and easier to pass. Work gradually over several weeks up to 6 servings a day of fiber (25g a day even more if needed) in the form of: o Vegetables -- Root (potatoes, carrots, turnips), leafy green (lettuce, salad greens, celery, spinach), or cooked high residue (cabbage, broccoli, etc) o Fruit -- Fresh (unpeeled skin & pulp), Dried (prunes, apricots, cherries, etc ),  or stewed ( applesauce)  o Whole grain breads, pasta, etc (whole wheat)  o Bran cereals  Bulking Agents -- This type of water-retaining fiber generally is easily obtained each day by one of the following:  o Psyllium bran -- The psyllium plant is remarkable because its ground seeds can retain so much water. This product is available as Metamucil, Konsyl, Effersyllium, Per Diem Fiber, or the less expensive generic preparation in drug and health food stores. Although labeled a laxative, it really is not a laxative.  o Methylcellulose -- This is another fiber derived from wood which also retains water. It is available as Citrucel. o Polyethylene Glycol - and artificial fiber commonly called Miralax or Glycolax.  It is helpful for people with gassy or bloated feelings with regular fiber o Flax Seed - a less gassy fiber than psyllium  No reading or other relaxing activity while on the toilet. If bowel movements take longer than 5 minutes, you are too constipated  AVOID CONSTIPATION.   High fiber and water intake usually takes care of this.  Sometimes a laxative is needed to stimulate more frequent bowel movements, but   Laxatives are not a good long-term solution as it can wear the colon out.  They can help jump-start bowels if constipated, but should be relied on constantly without discussing with your doctor o Osmotics (Milk of Magnesia, Fleets phosphosoda, Magnesium citrate, MiraLax, GoLytely) are safer than  o Stimulants (Senokot, Castor Oil, Dulcolax, Ex Lax)    o Avoid taking laxatives for more than 7 days in a row.   IF SEVERELY CONSTIPATED, try a Bowel Retraining Program: o Do not use laxatives.  o Eat a diet high in roughage, such as bran cereals and leafy vegetables.  o Drink six (6) ounces of prune or apricot juice each morning.  o Eat two (2) large servings of stewed fruit each day.  o Take one (1) heaping tablespoon of a psyllium-based bulking agent twice a day. Use sugar-free sweetener when possible to avoid excessive calories.  o Eat a normal breakfast.  o Set aside 15 minutes after breakfast to sit on the toilet, but do not strain to have a bowel movement.  o If you do not have a bowel movement by the third day, use an enema and repeat the above steps.   Controlling diarrhea o Switch to liquids and simpler foods for a few days to avoid stressing your intestines further. o Avoid dairy products (especially milk & ice cream) for a short time.  The intestines often can lose the ability to digest lactose when stressed. o Avoid foods that cause gassiness or bloating.  Typical foods include beans and other legumes, cabbage, broccoli, and dairy foods.  Every person has some sensitivity to other foods, so listen to our body and avoid those foods that trigger problems for you. o Adding fiber (Citrucel, Metamucil, psyllium, Miralax) gradually can help thicken stools by absorbing excess fluid and retrain the intestines to act more normally.  Slowly increase the dose over  a few weeks.  Too much fiber too soon can backfire and cause cramping & bloating. o Probiotics (such as active yogurt, Align, etc) may help repopulate the intestines and colon with normal bacteria and calm down a sensitive digestive tract.  Most studies show it to be of mild help, though, and such products can be costly. o Medicines: - Bismuth subsalicylate (ex. Kayopectate, Pepto Bismol) every 30 minutes for up to 6 doses can help control diarrhea.  Avoid if pregnant. - Loperamide (Immodium) can slow down diarrhea.  Start with two tablets (23m total) first  and then try one tablet every 6 hours.  Avoid if you are having fevers or severe pain.  If you are not better or start feeling worse, stop all medicines and call your doctor for advice o Call your doctor if you are getting worse or not better.  Sometimes further testing (cultures, endoscopy, X-ray studies, bloodwork, etc) may be needed to help diagnose and treat the cause of the diarrhea.  TROUBLESHOOTING IRREGULAR BOWELS 1) Avoid extremes of bowel movements (no bad constipation/diarrhea) 2) Miralax 17gm mixed in 8oz. water or juice-daily. May use BID as needed.  3) Gas-x,Phazyme, etc. as needed for gas & bloating.  4) Soft,bland diet. No spicy,greasy,fried foods.  5) Prilosec over-the-counter as needed  6) May hold gluten/wheat products from diet to see if symptoms improve.  7)  May try probiotics (Align, Activa, etc) to help calm the bowels down 7) If symptoms become worse call back immediately.  Managing Pain  Pain after surgery or related to activity is often due to strain/injury to muscle, tendon, nerves and/or incisions.  This pain is usually short-term and will improve in a few months.   Many people find it helpful to do the following things TOGETHER to help speed the process of healing and to get back to regular activity more quickly:  1. Avoid heavy physical activity at first a. No lifting greater than 20 pounds at first, then  increase to lifting as tolerated over the next few weeks b. Do not push through the pain.  Listen to your body and avoid positions and maneuvers than reproduce the pain.  Wait a few days before trying something more intense c. Walking is okay as tolerated, but go slowly and stop when getting sore.  If you can walk 30 minutes without stopping or pain, you can try more intense activity (running, jogging, aerobics, cycling, swimming, treadmill, sex, sports, weightlifting, etc ) d. Remember: If it hurts to do it, then dont do it!  2. Take Anti-inflammatory medication a. Choose ONE of the following over-the-counter medications: i.            Acetaminophen 500mg  tabs (Tylenol) 1-2 pills with every meal and just before bedtime (avoid if you have liver problems) ii.            Naproxen 220mg  tabs (ex. Aleve) 1-2 pills twice a day (avoid if you have kidney, stomach, IBD, or bleeding problems) iii. Ibuprofen 200mg  tabs (ex. Advil, Motrin) 3-4 pills with every meal and just before bedtime (avoid if you have kidney, stomach, IBD, or bleeding problems) b. Take with food/snack around the clock for 1-2 weeks i. This helps the muscle and nerve tissues become less irritable and calm down faster  3. Use a Heating pad or Ice/Cold Pack a. 4-6 times a day b. May use warm bath/hottub  or showers  4. Try Gentle Massage and/or Stretching  a. at the area of pain many times a day b. stop if you feel pain - do not overdo it  Try these steps together to help you body heal faster and avoid making things get worse.  Doing just one of these things may not be enough.    If you are not getting better after two weeks or are noticing you are getting worse, contact our office for further advice; we may need to re-evaluate you & see what other things we can do to help.  Hernia A hernia occurs when an internal organ pushes out through a weak spot in the  abdominal wall. Hernias most commonly occur in the groin and around the  navel. Hernias often can be pushed back into place (reduced). Most hernias tend to get worse over time. Some abdominal hernias can get stuck in the opening (irreducible or incarcerated hernia) and cannot be reduced. An irreducible abdominal hernia which is tightly squeezed into the opening is at risk for impaired blood supply (strangulated hernia). A strangulated hernia is a medical emergency. Because of the risk for an irreducible or strangulated hernia, surgery may be recommended to repair a hernia. CAUSES   Heavy lifting.  Prolonged coughing.  Straining to have a bowel movement.  A cut (incision) made during an abdominal surgery. HOME CARE INSTRUCTIONS   Bed rest is not required. You may continue your normal activities.  Avoid lifting more than 10 pounds (4.5 kg) or straining.  Cough gently. If you are a smoker it is best to stop. Even the best hernia repair can break down with the continual strain of coughing. Even if you do not have your hernia repaired, a cough will continue to aggravate the problem.  Do not wear anything tight over your hernia. Do not try to keep it in with an outside bandage or truss. These can damage abdominal contents if they are trapped within the hernia sac.  Eat a normal diet.  Avoid constipation. Straining over long periods of time will increase hernia size and encourage breakdown of repairs. If you cannot do this with diet alone, stool softeners may be used. SEEK IMMEDIATE MEDICAL CARE IF:   You have a fever.  You develop increasing abdominal pain.  You feel nauseous or vomit.  Your hernia is stuck outside the abdomen, looks discolored, feels hard, or is tender.  You have any changes in your bowel habits or in the hernia that are unusual for you.  You have increased pain or swelling around the hernia.  You cannot push the hernia back in place by applying gentle pressure while lying down. MAKE SURE YOU:   Understand these instructions.  Will  watch your condition.  Will get help right away if you are not doing well or get worse. Document Released: 08/12/2005 Document Revised: 11/04/2011 Document Reviewed: 03/31/2008 [facility] Patient Information 2015 [place], [place]. This information is not intended to replace advice given to you by your health care provider. Make sure you discuss any questions you have with your health care provider.

## 2015-03-30 NOTE — Progress Notes (Signed)
All DC instructions have been reviewed and all questions and concerns have been addressed. Pt alert and oriented times 4, VSS, pain is controlled, no s/s of apparent distress or discomfort at this time, skin intact. HH has been set up and follow up with pt after DC.

## 2015-03-30 NOTE — Discharge Summary (Signed)
Physician Discharge Summary  Patient ID: [name] MRN: [id number] DOB/AGE: [birth year] 59 y.o.  Admit date: 03/26/2015 Discharge date: 03/30/2015  Patient Care Team: Lillard Anes, MD as PCP - General (Family Medicine) Newman Pies, MD as Consulting Physician (Neurosurgery) Park Liter, MD as Consulting Physician (Cardiology) Michael Boston, MD as Consulting Physician (General Surgery)  Admission Diagnoses: Principal Problem:   Ileus following gastrointestinal surgery Active Problems:   COPD (chronic obstructive pulmonary disease)   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 03/23/2015   Chronic narcotic use   GERD (gastroesophageal reflux disease)   Discharge Diagnoses:  Principal Problem:   Ileus following gastrointestinal surgery Active Problems:   COPD (chronic obstructive pulmonary disease)   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 03/23/2015   Chronic narcotic use   GERD (gastroesophageal reflux disease)   PRE-OPERATIVE DIAGNOSIS: Ventral Wall Abdominal Hernia  POST-OPERATIVE DIAGNOSIS: Incisional Ventral Wall Abdominal Hernia Moderate intra-abdominal adhesions with internal defects  PROCEDURE: Procedure(s): LAPAROSCOPIC VENTRAL WALL HERNIA REPAIR LAPAROSCOPIC LYSIS OF ADHESIONS x 2 hours (2/3 of case)  SURGEON: Surgeon(s): Michael Boston, MD  Consults: None  Hospital Course:   The patient underwent  the surgery above.  Postoperatively, the patient gradually mobilized and advanced to a solid diet.  Pain and other symptoms were treated aggressively.  Discharged POD#1.  Readmitted 3 days later for nausea/vomting & ileus.  Hydrated.  Enema given.  Pain controlled improved.  Began to have flatus.  NGT rem,oved.  Diet advanced.  No leak/asbcess/decline by exam/workup  By the time of discharge, the patient was walking well the hallways, eating food, having flatus.  Pain was well-controlled  on an oral medications.  Based on meeting discharge criteria and continuing to recover, I felt it was safe for the patient to be discharged from the hospital to further recover with close followup. Postoperative recommendations were discussed in detail.  They are written as well.   Significant Diagnostic Studies:  No results found for this or any previous visit (from the past 72 hour(s)).  Ct Abdomen Pelvis W Contrast  03/26/2015   CLINICAL DATA:  Abdominal pain, nausea and vomiting since this morning. Laparoscopic hernia repair on 03/23/2015  EXAM: CT ABDOMEN AND PELVIS WITH CONTRAST  TECHNIQUE: Multidetector CT imaging of the abdomen and pelvis was performed using the standard protocol following bolus administration of intravenous contrast.  CONTRAST:  166mL OMNIPAQUE IOHEXOL 300 MG/ML  SOLN  COMPARISON:  CT 07/31/2014  FINDINGS: There is right base atelectasis. No pleural effusions. Heart is normal size.  Prior cholecystectomy. Liver, spleen, pancreas, adrenals and kidneys are unremarkable. No hydronephrosis.  Gas, fluid and stranding noted in the anterior subcutaneous soft tissues. This is presumably postoperative from recent hernia repair.  There is moderate free fluid in the pelvis. Uterus, adnexae a unremarkable. Small amount a gas in the urinary bladder, presumably from recent catheterization. There is mild diffuse gaseous distention of small bowel loops, presumably mild ileus. Diffuse colonic diverticulosis. No active diverticulitis. Aorta is normal caliber.  IMPRESSION: Midline gas and fluid collections in the anterior abdominal wall with surrounding stranding, presumably related to recent postop hernia repair.  Moderate free fluid in the cul-de-sac of the pelvis. There is generalized small bowel distention which likely reflects ileus.  Colonic diverticulosis.  Elevation of the right hemidiaphragm.  Right base atelectasis.   Electronically Signed   By: Rolm Baptise M.D.   On: 03/26/2015 13:48    Dg Abd 2 Views  03/27/2015   CLINICAL DATA:  Ileus following GI surgery.  EXAM: ABDOMEN - 2 VIEW  COMPARISON:  CT of the abdomen and pelvis on 03/26/2015  FINDINGS: There is mild distension of small bowel loops. There is no evidence for free intraperitoneal air. Large bowel loops contain gas and stool and appear normal in caliber. Patient has a nasogastric tube, coiled in the region of the stomach.  IMPRESSION: Findings are consistent with persistent ileus compare with previous CT exam.   Electronically Signed   By: Nolon Nations M.D.   On: 03/27/2015 09:46    Discharge Exam: Blood pressure 100/50, pulse 69, temperature 97.9 F (36.6 C), temperature source Oral, resp. rate 16, height 5' (1.524 m), weight 61.236 kg (135 lb), SpO2 94 %.  General: Pt awake/alert/oriented x4 in no major acute distress Eyes: PERRL, normal EOM. Sclera nonicteric Neuro: CN II-XII intact w/o focal sensory/motor deficits. Lymph: No head/neck/groin lymphadenopathy Psych:  No delerium/psychosis/paranoia HENT: Normocephalic, Mucus membranes moist.  No thrush Neck: Supple, No tracheal deviation Chest: No pain.  Good respiratory excursion. CV:  Pulses intact.  Regular rhythm MS: Normal AROM mjr joints.  No obvious deformity Abdomen: Soft, Nondistended.  Min tender.  No incarcerated hernias. Ext:  SCDs BLE.  No significant edema.  No cyanosis Skin: No petechiae / purpura  Discharged Condition: good   Past Medical History  Diagnosis Date  . NICM (nonischemic cardiomyopathy)   . HLD (hyperlipidemia)   . Chronic back pain   . PVCs (premature ventricular contractions)   . Heart failure   . Left ventricular systolic dysfunction   . CHF (congestive heart failure)   . ICD (implantable cardiac defibrillator) in place 2012  . GERD (gastroesophageal reflux disease)   . Arthritis   . COPD (chronic obstructive pulmonary disease)   . AICD (automatic cardioverter/defibrillator) present 2012   . Asthma   . Myocardial  infarction     2011 during intestinal blockage surgery    Past Surgical History  Procedure Laterality Date  . Neck surgery      fused  . Carpal tunnel release    . Hand surgery    . Appendectomy    . Tonsillectomy    . Cesarean section    . Cholecystectomy    . Intestinal blockage 2011    . Ulnar nerve transposition  01/23/2012    Procedure: ULNAR NERVE DECOMPRESSION/TRANSPOSITION;  Surgeon: Ophelia Charter, MD;  Location: [facility];  Service: Neurosurgery;  Laterality: Left;  LEFT ulnar nerve decompression  . Laparoscopic assisted ventral hernia repair N/A 03/23/2015    Procedure: LAPAROSCOPIC VENTRAL WALL HERNIA REPAIR;  Surgeon: Michael Boston, MD;  Location: [facility];  Service: General;  Laterality: N/A;  With MESH  . Laparoscopic lysis of adhesions N/A 03/23/2015    Procedure: LAPAROSCOPIC LYSIS OF ADHESIONS;  Surgeon: Michael Boston, MD;  Location: [facility];  Service: General;  Laterality: N/A;    History   Social History  . Marital Status: Single    Spouse Name: N/A  . Number of Children: 3  . Years of Education: N/A   Occupational History  . disabled    Social History Main Topics  . Smoking status: Former Smoker -- 1.00 packs/day for 30 years    Types: Cigarettes    Quit date: 08/11/2010  . Smokeless tobacco: Never Used  . Alcohol Use: No  . Drug Use: No  . Sexual Activity: Not Currently   Other Topics Concern  . Not on file  Social History Narrative    Family History  Problem Relation Age of Onset  . Heart attack    . Cancer    . Heart failure    . Anesthesia problems Neg Hx   . Hypotension Neg Hx   . Malignant hyperthermia Neg Hx   . Pseudochol deficiency Neg Hx     Current Facility-Administered Medications  Medication Dose Route Frequency Provider Last Rate Last Dose  . acetaminophen (TYLENOL) suppository 650 mg  650 mg Rectal Q6H PRN Michael Boston, MD      . acetaminophen (TYLENOL) tablet 325-650 mg  325-650 mg Oral Q6H PRN Michael Boston, MD      .  alum & mag hydroxide-simeth (MAALOX/MYLANTA) 200-200-20 MG/5ML suspension 30 mL  30 mL Oral Q6H PRN Michael Boston, MD      . aspirin chewable tablet 81 mg  81 mg Oral Daily Michael Boston, MD   81 mg at 03/29/15 0934  . bisacodyl (DULCOLAX) suppository 10 mg  10 mg Rectal Daily Michael Boston, MD   10 mg at 03/29/15 1150  . carvedilol (COREG) tablet 3.125 mg  3.125 mg Oral BID WC Michael Boston, MD   3.125 mg at 03/29/15 0818  . diazepam (VALIUM) injection 5 mg  5 mg Intravenous Q8H PRN Michael Boston, MD      . diazepam (VALIUM) tablet 5 mg  5 mg Oral TID PRN Michael Boston, MD      . diphenhydrAMINE (BENADRYL) injection 12.5-25 mg  12.5-25 mg Intravenous Q6H PRN Michael Boston, MD      . fentaNYL ([place] - dosed mcg/hr) 75 mcg  75 mcg Transdermal Q48H Alphonsa Overall, MD   75 mcg at 03/28/15 0859  . fluticasone (FLONASE) 50 MCG/ACT nasal spray 1 spray  1 spray Each Nare Daily Michael Boston, MD   1 spray at 03/29/15 0933  . heparin injection 5,000 Units  5,000 Units Subcutaneous 3 times per day Alphonsa Overall, MD   5,000 Units at 03/30/15 0513  . HYDROcodone-acetaminophen (NORCO) 10-325 MG per tablet 1-2 tablet  1-2 tablet Oral Q4H PRN Michael Boston, MD   2 tablet at 03/30/15 0110  . HYDROmorphone (DILAUDID) injection 1-2 mg  1-2 mg Intravenous Q2H PRN Michael Boston, MD   2 mg at 03/29/15 0557  . ipratropium (ATROVENT) nebulizer solution 0.5 mg  0.5 mg Nebulization Q4H PRN Alphonsa Overall, MD      . ipratropium-albuterol (DUONEB) 0.5-2.5 (3) MG/3ML nebulizer solution 3 mL  3 mL Inhalation Q6H PRN Alphonsa Overall, MD      . isosorbide mononitrate (IMDUR) 24 hr tablet 30 mg  30 mg Oral Daily Michael Boston, MD   30 mg at 03/29/15 0934  . lip balm (CARMEX) ointment 1 application  1 application Topical BID Michael Boston, MD   1 application at [id number]  . lisinopril (PRINIVIL,ZESTRIL) tablet 10 mg  10 mg Oral Daily Michael Boston, MD   10 mg at 03/29/15 0934  . magic mouthwash  15 mL Oral QID PRN Michael Boston, MD      .  menthol-cetylpyridinium (CEPACOL) lozenge 3 mg  1 lozenge Oral PRN Michael Boston, MD      . methocarbamol (ROBAXIN) 1,000 mg in dextrose 5 % 50 mL IVPB  1,000 mg Intravenous Q6H PRN Michael Boston, MD      . methocarbamol (ROBAXIN) tablet 1,000 mg  1,000 mg Oral Q6H PRN Michael Boston, MD      . metoprolol (LOPRESSOR) injection 5 mg  5 mg Intravenous Q6H  PRN Michael Boston, MD      . mometasone-formoterol [facility]) 100-5 MCG/ACT inhaler 2 puff  2 puff Inhalation BID Alphonsa Overall, MD   2 puff at 03/29/15 2032  . naproxen (NAPROSYN) tablet 500 mg  500 mg Oral TID WC Michael Boston, MD   500 mg at 03/29/15 1628  . nitroGLYCERIN (NITROSTAT) SL tablet 0.4 mg  0.4 mg Sublingual Q5 min PRN Alphonsa Overall, MD      . ondansetron [facility]) injection 4 mg  4 mg Intravenous Q6H PRN Alphonsa Overall, MD   4 mg at 03/26/15 2348  . pantoprazole (PROTONIX) injection 40 mg  40 mg Intravenous Q12H Michael Boston, MD   40 mg at 03/29/15 2149  . phenol (CHLORASEPTIC) mouth spray 1 spray  1 spray Mouth/Throat PRN Alphonsa Overall, MD   1 spray at 03/27/15 0247  . polyethylene glycol (MIRALAX / GLYCOLAX) packet 17 g  17 g Oral BID Michael Boston, MD   17 g at 03/29/15 2149  . polyethylene glycol (MIRALAX / GLYCOLAX) packet 17 g  17 g Oral Q12H PRN Michael Boston, MD      . polyethylene glycol (MIRALAX / GLYCOLAX) packet 34 g  34 g Oral Once Michael Boston, MD      . potassium chloride (K-DUR,KLOR-CON) CR tablet 10 mEq  10 mEq Oral Daily Michael Boston, MD   10 mEq at 03/29/15 0934  . promethazine (PHENERGAN) injection 6.25-12.5 mg  6.25-12.5 mg Intravenous Q4H PRN Michael Boston, MD      . ranolazine (RANEXA) 12 hr tablet 500 mg  500 mg Oral BID Alphonsa Overall, MD   500 mg at 03/29/15 2149  . rosuvastatin (CRESTOR) tablet 10 mg  10 mg Oral Daily Michael Boston, MD   10 mg at 03/29/15 0934  . saccharomyces boulardii (FLORASTOR) capsule 250 mg  250 mg Oral BID Michael Boston, MD   250 mg at 03/29/15 2149     No Known Allergies  Disposition: 01-Home or Self  Care     Medication List    ASK your doctor about these medications        albuterol-ipratropium 18-103 MCG/ACT inhaler  Commonly known as:  COMBIVENT  Inhale 2 puffs into the lungs every 6 (six) hours as needed. For shortness of breath     aspirin 81 MG tablet  Take 81 mg by mouth daily.     carvedilol 3.125 MG tablet  Commonly known as:  COREG  Take 3.125 mg by mouth 2 (two) times daily with a meal.     diazepam 5 MG tablet  Commonly known as:  VALIUM  Take 1 tablet by mouth 3 (three) times daily as needed for anxiety.     esomeprazole 20 MG capsule  Commonly known as:  NEXIUM  Take 20 mg by mouth daily at 12 noon.     fentaNYL 75 MCG/HR  Commonly known as:  DURAGESIC - dosed mcg/hr  Place 1 patch onto the skin every other day.     fluticasone 50 MCG/ACT nasal spray  Commonly known as:  FLONASE  Apply 1 spray topically daily. Spray under patch     Fluticasone-Salmeterol 250-50 MCG/DOSE Aepb  Commonly known as:  ADVAIR  Inhale 1 puff into the lungs 2 (two) times daily as needed (SOB).     HYDROcodone-acetaminophen 10-325 MG per tablet  Commonly known as:  NORCO  Take 1-2 tablets by mouth every 6 (six) hours as needed for moderate pain or severe pain.  ipratropium 0.02 % nebulizer solution  Commonly known as:  ATROVENT  Take 0.5 mg by nebulization every 4 (four) hours as needed for wheezing or shortness of breath.     isosorbide mononitrate 30 MG 24 hr tablet  Commonly known as:  IMDUR  Take 30 mg by mouth daily.     lisinopril 10 MG tablet  Commonly known as:  PRINIVIL,ZESTRIL  Take 10 mg by mouth daily.     methocarbamol 750 MG tablet  Commonly known as:  ROBAXIN  Take 1 tablet (750 mg total) by mouth 4 (four) times daily as needed (use for muscle cramps/pain).     nitroGLYCERIN 0.4 MG SL tablet  Commonly known as:  NITROSTAT  Place 0.4 mg under the tongue every 5 (five) minutes as needed for chest pain.     potassium chloride 10 MEQ CR capsule   Commonly known as:  MICRO-K  Take 10 mEq by mouth 2 (two) times daily.     RANEXA 1000 MG SR tablet  Generic drug:  ranolazine  Take 500 mg by mouth 2 (two) times daily.     rosuvastatin 10 MG tablet  Commonly known as:  CRESTOR  Take 10 mg by mouth daily.          Signed: Morton Peters, M.D., F.A.C.S. Gastrointestinal and Minimally Invasive Surgery [facility] [address], [place], [place] [address] [phone] Main / Paging   03/30/2015, 8:01 AM

## 2015-03-30 NOTE — Care Management Note (Signed)
Case Management Note  Patient Details  Name: [name] MRN: [id number] Date of Birth: 11/16/1955  Subjective/Objective:                 Admitted with Ileus following gastrointestinal surgery   Action/Plan: Discharge planning, received a referral to arrange HHPT/OT for patient. Spoke with patient at bedside, she is agreeable to [facility] services. Provided her with a list for choice. Patient chose [facility] agency. Contacted [facility], faxed H&P, demographics and d/c summary to them. Awaiting orders and F2F. Nursing aware of need for orders that will need to be faxed as well. Patient has DME in room already.   Expected Discharge Date:  03/29/15               Expected Discharge Plan:  [place]  In-House Referral:  NA  Discharge planning Services  CM Consult  Post Acute Care Choice:  Home Health Choice offered to:  Patient  DME Arranged:  3-N-1, [place] rolling DME Agency:  [place]:  PT, OT [facility] Agency:  [place]  Status of Service:  Completed, signed off  Medicare Important Message Given:  Yes-second notification given Date Medicare IM Given:    Medicare IM give by:    Date Additional Medicare IM Given:    Additional Medicare Important Message give by:     If discussed at [place] of Stay Meetings, dates discussed:    Additional Comments:  Guadalupe Maple, RN 03/30/2015, 10:55 AM

## 2015-04-03 DIAGNOSIS — R262 Difficulty in walking, not elsewhere classified: Secondary | ICD-10-CM | POA: Diagnosis not present

## 2015-04-03 DIAGNOSIS — K913 Postprocedural intestinal obstruction: Secondary | ICD-10-CM | POA: Diagnosis not present

## 2015-04-03 DIAGNOSIS — M6281 Muscle weakness (generalized): Secondary | ICD-10-CM | POA: Diagnosis not present

## 2015-04-03 DIAGNOSIS — J449 Chronic obstructive pulmonary disease, unspecified: Secondary | ICD-10-CM | POA: Diagnosis not present

## 2015-04-03 DIAGNOSIS — I509 Heart failure, unspecified: Secondary | ICD-10-CM | POA: Diagnosis not present

## 2015-04-06 DIAGNOSIS — N390 Urinary tract infection, site not specified: Secondary | ICD-10-CM | POA: Diagnosis not present

## 2015-04-06 DIAGNOSIS — R262 Difficulty in walking, not elsewhere classified: Secondary | ICD-10-CM | POA: Diagnosis not present

## 2015-04-06 DIAGNOSIS — J449 Chronic obstructive pulmonary disease, unspecified: Secondary | ICD-10-CM | POA: Diagnosis not present

## 2015-04-06 DIAGNOSIS — Z9889 Other specified postprocedural states: Secondary | ICD-10-CM | POA: Diagnosis not present

## 2015-04-06 DIAGNOSIS — K913 Postprocedural intestinal obstruction: Secondary | ICD-10-CM | POA: Diagnosis not present

## 2015-04-06 DIAGNOSIS — I509 Heart failure, unspecified: Secondary | ICD-10-CM | POA: Diagnosis not present

## 2015-04-06 DIAGNOSIS — Z6826 Body mass index (BMI) 26.0-26.9, adult: Secondary | ICD-10-CM | POA: Diagnosis not present

## 2015-04-06 DIAGNOSIS — M6281 Muscle weakness (generalized): Secondary | ICD-10-CM | POA: Diagnosis not present

## 2015-04-10 DIAGNOSIS — K913 Postprocedural intestinal obstruction: Secondary | ICD-10-CM | POA: Diagnosis not present

## 2015-04-10 DIAGNOSIS — I509 Heart failure, unspecified: Secondary | ICD-10-CM | POA: Diagnosis not present

## 2015-04-10 DIAGNOSIS — M6281 Muscle weakness (generalized): Secondary | ICD-10-CM | POA: Diagnosis not present

## 2015-04-10 DIAGNOSIS — J449 Chronic obstructive pulmonary disease, unspecified: Secondary | ICD-10-CM | POA: Diagnosis not present

## 2015-04-10 DIAGNOSIS — R262 Difficulty in walking, not elsewhere classified: Secondary | ICD-10-CM | POA: Diagnosis not present

## 2015-04-12 DIAGNOSIS — R262 Difficulty in walking, not elsewhere classified: Secondary | ICD-10-CM | POA: Diagnosis not present

## 2015-04-12 DIAGNOSIS — J449 Chronic obstructive pulmonary disease, unspecified: Secondary | ICD-10-CM | POA: Diagnosis not present

## 2015-04-12 DIAGNOSIS — M6281 Muscle weakness (generalized): Secondary | ICD-10-CM | POA: Diagnosis not present

## 2015-04-12 DIAGNOSIS — K913 Postprocedural intestinal obstruction: Secondary | ICD-10-CM | POA: Diagnosis not present

## 2015-04-12 DIAGNOSIS — I509 Heart failure, unspecified: Secondary | ICD-10-CM | POA: Diagnosis not present

## 2015-04-17 DIAGNOSIS — M6281 Muscle weakness (generalized): Secondary | ICD-10-CM | POA: Diagnosis not present

## 2015-04-17 DIAGNOSIS — K913 Postprocedural intestinal obstruction: Secondary | ICD-10-CM | POA: Diagnosis not present

## 2015-04-17 DIAGNOSIS — J449 Chronic obstructive pulmonary disease, unspecified: Secondary | ICD-10-CM | POA: Diagnosis not present

## 2015-04-17 DIAGNOSIS — R262 Difficulty in walking, not elsewhere classified: Secondary | ICD-10-CM | POA: Diagnosis not present

## 2015-04-17 DIAGNOSIS — I509 Heart failure, unspecified: Secondary | ICD-10-CM | POA: Diagnosis not present

## 2015-04-19 DIAGNOSIS — K913 Postprocedural intestinal obstruction: Secondary | ICD-10-CM | POA: Diagnosis not present

## 2015-04-19 DIAGNOSIS — J449 Chronic obstructive pulmonary disease, unspecified: Secondary | ICD-10-CM | POA: Diagnosis not present

## 2015-04-19 DIAGNOSIS — I509 Heart failure, unspecified: Secondary | ICD-10-CM | POA: Diagnosis not present

## 2015-04-19 DIAGNOSIS — R262 Difficulty in walking, not elsewhere classified: Secondary | ICD-10-CM | POA: Diagnosis not present

## 2015-04-19 DIAGNOSIS — M6281 Muscle weakness (generalized): Secondary | ICD-10-CM | POA: Diagnosis not present

## 2015-04-24 DIAGNOSIS — K913 Postprocedural intestinal obstruction: Secondary | ICD-10-CM | POA: Diagnosis not present

## 2015-04-24 DIAGNOSIS — M6281 Muscle weakness (generalized): Secondary | ICD-10-CM | POA: Diagnosis not present

## 2015-04-24 DIAGNOSIS — R262 Difficulty in walking, not elsewhere classified: Secondary | ICD-10-CM | POA: Diagnosis not present

## 2015-04-24 DIAGNOSIS — J449 Chronic obstructive pulmonary disease, unspecified: Secondary | ICD-10-CM | POA: Diagnosis not present

## 2015-04-24 DIAGNOSIS — I509 Heart failure, unspecified: Secondary | ICD-10-CM | POA: Diagnosis not present

## 2015-04-25 DIAGNOSIS — K913 Postprocedural intestinal obstruction: Secondary | ICD-10-CM | POA: Diagnosis not present

## 2015-04-25 DIAGNOSIS — R262 Difficulty in walking, not elsewhere classified: Secondary | ICD-10-CM | POA: Diagnosis not present

## 2015-04-26 DIAGNOSIS — R262 Difficulty in walking, not elsewhere classified: Secondary | ICD-10-CM | POA: Diagnosis not present

## 2015-04-26 DIAGNOSIS — I509 Heart failure, unspecified: Secondary | ICD-10-CM | POA: Diagnosis not present

## 2015-04-26 DIAGNOSIS — J449 Chronic obstructive pulmonary disease, unspecified: Secondary | ICD-10-CM | POA: Diagnosis not present

## 2015-04-26 DIAGNOSIS — K913 Postprocedural intestinal obstruction: Secondary | ICD-10-CM | POA: Diagnosis not present

## 2015-04-26 DIAGNOSIS — M6281 Muscle weakness (generalized): Secondary | ICD-10-CM | POA: Diagnosis not present

## 2015-05-10 DIAGNOSIS — M47816 Spondylosis without myelopathy or radiculopathy, lumbar region: Secondary | ICD-10-CM | POA: Diagnosis not present

## 2015-05-10 DIAGNOSIS — M545 Low back pain: Secondary | ICD-10-CM | POA: Diagnosis not present

## 2015-05-10 DIAGNOSIS — G894 Chronic pain syndrome: Secondary | ICD-10-CM | POA: Diagnosis not present

## 2015-05-11 DIAGNOSIS — K913 Postprocedural intestinal obstruction: Secondary | ICD-10-CM | POA: Diagnosis not present

## 2015-05-11 DIAGNOSIS — I5020 Unspecified systolic (congestive) heart failure: Secondary | ICD-10-CM | POA: Diagnosis not present

## 2015-05-11 DIAGNOSIS — I428 Other cardiomyopathies: Secondary | ICD-10-CM | POA: Diagnosis not present

## 2015-05-11 DIAGNOSIS — Z0000 Encounter for general adult medical examination without abnormal findings: Secondary | ICD-10-CM | POA: Diagnosis not present

## 2015-05-11 DIAGNOSIS — E785 Hyperlipidemia, unspecified: Secondary | ICD-10-CM | POA: Diagnosis not present

## 2015-05-11 DIAGNOSIS — Z6825 Body mass index (BMI) 25.0-25.9, adult: Secondary | ICD-10-CM | POA: Diagnosis not present

## 2015-05-11 DIAGNOSIS — J441 Chronic obstructive pulmonary disease with (acute) exacerbation: Secondary | ICD-10-CM | POA: Diagnosis not present

## 2015-05-11 DIAGNOSIS — R262 Difficulty in walking, not elsewhere classified: Secondary | ICD-10-CM | POA: Diagnosis not present

## 2015-05-26 DIAGNOSIS — I498 Other specified cardiac arrhythmias: Secondary | ICD-10-CM | POA: Diagnosis not present

## 2015-05-26 DIAGNOSIS — Z4502 Encounter for adjustment and management of automatic implantable cardiac defibrillator: Secondary | ICD-10-CM | POA: Diagnosis not present

## 2015-05-31 DIAGNOSIS — I2510 Atherosclerotic heart disease of native coronary artery without angina pectoris: Secondary | ICD-10-CM | POA: Insufficient documentation

## 2015-05-31 DIAGNOSIS — E782 Mixed hyperlipidemia: Secondary | ICD-10-CM

## 2015-05-31 DIAGNOSIS — I429 Cardiomyopathy, unspecified: Secondary | ICD-10-CM | POA: Diagnosis not present

## 2015-05-31 DIAGNOSIS — I10 Essential (primary) hypertension: Secondary | ICD-10-CM | POA: Insufficient documentation

## 2015-05-31 DIAGNOSIS — E785 Hyperlipidemia, unspecified: Secondary | ICD-10-CM | POA: Insufficient documentation

## 2015-05-31 HISTORY — DX: Hyperlipidemia, unspecified: E78.5

## 2015-05-31 HISTORY — DX: Essential (primary) hypertension: I10

## 2015-05-31 HISTORY — DX: Atherosclerotic heart disease of native coronary artery without angina pectoris: I25.10

## 2015-05-31 HISTORY — DX: Mixed hyperlipidemia: E78.2

## 2015-06-02 DIAGNOSIS — H6521 Chronic serous otitis media, right ear: Secondary | ICD-10-CM | POA: Diagnosis not present

## 2015-06-02 DIAGNOSIS — R0981 Nasal congestion: Secondary | ICD-10-CM | POA: Diagnosis not present

## 2015-06-02 DIAGNOSIS — J342 Deviated nasal septum: Secondary | ICD-10-CM | POA: Diagnosis not present

## 2015-06-06 DIAGNOSIS — I2510 Atherosclerotic heart disease of native coronary artery without angina pectoris: Secondary | ICD-10-CM | POA: Diagnosis not present

## 2015-06-06 DIAGNOSIS — I501 Left ventricular failure: Secondary | ICD-10-CM | POA: Diagnosis not present

## 2015-06-06 DIAGNOSIS — I083 Combined rheumatic disorders of mitral, aortic and tricuspid valves: Secondary | ICD-10-CM | POA: Diagnosis not present

## 2015-06-07 DIAGNOSIS — Z79891 Long term (current) use of opiate analgesic: Secondary | ICD-10-CM | POA: Diagnosis not present

## 2015-06-07 DIAGNOSIS — M47816 Spondylosis without myelopathy or radiculopathy, lumbar region: Secondary | ICD-10-CM | POA: Diagnosis not present

## 2015-06-07 DIAGNOSIS — M545 Low back pain: Secondary | ICD-10-CM | POA: Diagnosis not present

## 2015-06-07 DIAGNOSIS — G894 Chronic pain syndrome: Secondary | ICD-10-CM | POA: Diagnosis not present

## 2015-06-08 DIAGNOSIS — I429 Cardiomyopathy, unspecified: Secondary | ICD-10-CM | POA: Diagnosis not present

## 2015-06-08 DIAGNOSIS — I10 Essential (primary) hypertension: Secondary | ICD-10-CM | POA: Diagnosis not present

## 2015-06-08 DIAGNOSIS — I2510 Atherosclerotic heart disease of native coronary artery without angina pectoris: Secondary | ICD-10-CM | POA: Diagnosis not present

## 2015-06-08 DIAGNOSIS — R079 Chest pain, unspecified: Secondary | ICD-10-CM | POA: Diagnosis not present

## 2015-06-08 DIAGNOSIS — E785 Hyperlipidemia, unspecified: Secondary | ICD-10-CM | POA: Diagnosis not present

## 2015-06-12 DIAGNOSIS — I429 Cardiomyopathy, unspecified: Secondary | ICD-10-CM | POA: Diagnosis not present

## 2015-06-12 DIAGNOSIS — Z950 Presence of cardiac pacemaker: Secondary | ICD-10-CM | POA: Diagnosis not present

## 2015-06-12 DIAGNOSIS — I10 Essential (primary) hypertension: Secondary | ICD-10-CM | POA: Diagnosis not present

## 2015-06-23 DIAGNOSIS — J449 Chronic obstructive pulmonary disease, unspecified: Secondary | ICD-10-CM | POA: Diagnosis not present

## 2015-06-23 DIAGNOSIS — R079 Chest pain, unspecified: Secondary | ICD-10-CM | POA: Diagnosis not present

## 2015-06-23 DIAGNOSIS — Z9889 Other specified postprocedural states: Secondary | ICD-10-CM | POA: Diagnosis not present

## 2015-06-23 DIAGNOSIS — K219 Gastro-esophageal reflux disease without esophagitis: Secondary | ICD-10-CM | POA: Diagnosis not present

## 2015-06-23 DIAGNOSIS — I428 Other cardiomyopathies: Secondary | ICD-10-CM | POA: Diagnosis not present

## 2015-06-23 DIAGNOSIS — R9439 Abnormal result of other cardiovascular function study: Secondary | ICD-10-CM | POA: Diagnosis not present

## 2015-06-23 DIAGNOSIS — Z87891 Personal history of nicotine dependence: Secondary | ICD-10-CM | POA: Diagnosis not present

## 2015-06-23 DIAGNOSIS — I10 Essential (primary) hypertension: Secondary | ICD-10-CM | POA: Diagnosis not present

## 2015-06-23 DIAGNOSIS — I2510 Atherosclerotic heart disease of native coronary artery without angina pectoris: Secondary | ICD-10-CM | POA: Diagnosis not present

## 2015-06-23 DIAGNOSIS — Z79899 Other long term (current) drug therapy: Secondary | ICD-10-CM | POA: Diagnosis not present

## 2015-06-23 DIAGNOSIS — I501 Left ventricular failure: Secondary | ICD-10-CM | POA: Diagnosis not present

## 2015-06-23 DIAGNOSIS — I429 Cardiomyopathy, unspecified: Secondary | ICD-10-CM | POA: Diagnosis not present

## 2015-06-23 DIAGNOSIS — R0602 Shortness of breath: Secondary | ICD-10-CM | POA: Diagnosis not present

## 2015-06-23 DIAGNOSIS — E785 Hyperlipidemia, unspecified: Secondary | ICD-10-CM | POA: Diagnosis not present

## 2015-07-04 DIAGNOSIS — I2510 Atherosclerotic heart disease of native coronary artery without angina pectoris: Secondary | ICD-10-CM | POA: Diagnosis not present

## 2015-07-04 DIAGNOSIS — I429 Cardiomyopathy, unspecified: Secondary | ICD-10-CM | POA: Diagnosis not present

## 2015-07-04 DIAGNOSIS — I10 Essential (primary) hypertension: Secondary | ICD-10-CM | POA: Diagnosis not present

## 2015-07-04 DIAGNOSIS — E785 Hyperlipidemia, unspecified: Secondary | ICD-10-CM | POA: Diagnosis not present

## 2015-07-12 DIAGNOSIS — M47816 Spondylosis without myelopathy or radiculopathy, lumbar region: Secondary | ICD-10-CM | POA: Diagnosis not present

## 2015-07-14 DIAGNOSIS — I2510 Atherosclerotic heart disease of native coronary artery without angina pectoris: Secondary | ICD-10-CM | POA: Diagnosis not present

## 2015-07-14 DIAGNOSIS — H6983 Other specified disorders of Eustachian tube, bilateral: Secondary | ICD-10-CM | POA: Diagnosis not present

## 2015-07-14 DIAGNOSIS — H6521 Chronic serous otitis media, right ear: Secondary | ICD-10-CM | POA: Diagnosis not present

## 2015-07-14 DIAGNOSIS — J342 Deviated nasal septum: Secondary | ICD-10-CM | POA: Diagnosis not present

## 2015-07-14 DIAGNOSIS — R0981 Nasal congestion: Secondary | ICD-10-CM | POA: Diagnosis not present

## 2015-07-22 DIAGNOSIS — N3090 Cystitis, unspecified without hematuria: Secondary | ICD-10-CM | POA: Diagnosis not present

## 2015-07-22 DIAGNOSIS — N3001 Acute cystitis with hematuria: Secondary | ICD-10-CM | POA: Diagnosis not present

## 2015-07-22 DIAGNOSIS — R1084 Generalized abdominal pain: Secondary | ICD-10-CM | POA: Diagnosis not present

## 2015-07-25 ENCOUNTER — Other Ambulatory Visit: Payer: Self-pay

## 2015-07-25 DIAGNOSIS — J343 Hypertrophy of nasal turbinates: Secondary | ICD-10-CM | POA: Diagnosis not present

## 2015-07-25 DIAGNOSIS — J449 Chronic obstructive pulmonary disease, unspecified: Secondary | ICD-10-CM | POA: Diagnosis not present

## 2015-07-25 DIAGNOSIS — Z79899 Other long term (current) drug therapy: Secondary | ICD-10-CM | POA: Diagnosis not present

## 2015-07-25 DIAGNOSIS — Z87891 Personal history of nicotine dependence: Secondary | ICD-10-CM | POA: Diagnosis not present

## 2015-07-25 DIAGNOSIS — J342 Deviated nasal septum: Secondary | ICD-10-CM | POA: Diagnosis not present

## 2015-07-25 DIAGNOSIS — R0981 Nasal congestion: Secondary | ICD-10-CM | POA: Diagnosis not present

## 2015-07-25 DIAGNOSIS — Z7982 Long term (current) use of aspirin: Secondary | ICD-10-CM | POA: Diagnosis not present

## 2015-07-25 DIAGNOSIS — H6521 Chronic serous otitis media, right ear: Secondary | ICD-10-CM | POA: Diagnosis not present

## 2015-07-25 DIAGNOSIS — H6983 Other specified disorders of Eustachian tube, bilateral: Secondary | ICD-10-CM | POA: Diagnosis not present

## 2015-07-25 DIAGNOSIS — I2510 Atherosclerotic heart disease of native coronary artery without angina pectoris: Secondary | ICD-10-CM | POA: Diagnosis not present

## 2015-07-28 DIAGNOSIS — R109 Unspecified abdominal pain: Secondary | ICD-10-CM | POA: Diagnosis not present

## 2015-07-28 DIAGNOSIS — R112 Nausea with vomiting, unspecified: Secondary | ICD-10-CM | POA: Diagnosis not present

## 2015-07-28 DIAGNOSIS — K2970 Gastritis, unspecified, without bleeding: Secondary | ICD-10-CM | POA: Diagnosis not present

## 2015-07-28 DIAGNOSIS — Z5321 Procedure and treatment not carried out due to patient leaving prior to being seen by health care provider: Secondary | ICD-10-CM | POA: Diagnosis not present

## 2015-07-28 DIAGNOSIS — R110 Nausea: Secondary | ICD-10-CM | POA: Diagnosis not present

## 2015-08-04 DIAGNOSIS — K5900 Constipation, unspecified: Secondary | ICD-10-CM | POA: Diagnosis not present

## 2015-08-04 DIAGNOSIS — R109 Unspecified abdominal pain: Secondary | ICD-10-CM | POA: Diagnosis not present

## 2015-08-04 DIAGNOSIS — K5790 Diverticulosis of intestine, part unspecified, without perforation or abscess without bleeding: Secondary | ICD-10-CM | POA: Diagnosis not present

## 2015-08-04 DIAGNOSIS — Z6825 Body mass index (BMI) 25.0-25.9, adult: Secondary | ICD-10-CM | POA: Diagnosis not present

## 2015-08-09 DIAGNOSIS — I5020 Unspecified systolic (congestive) heart failure: Secondary | ICD-10-CM | POA: Diagnosis not present

## 2015-08-09 DIAGNOSIS — E039 Hypothyroidism, unspecified: Secondary | ICD-10-CM | POA: Diagnosis not present

## 2015-08-09 DIAGNOSIS — I2510 Atherosclerotic heart disease of native coronary artery without angina pectoris: Secondary | ICD-10-CM | POA: Diagnosis not present

## 2015-08-09 DIAGNOSIS — E785 Hyperlipidemia, unspecified: Secondary | ICD-10-CM | POA: Diagnosis not present

## 2015-08-09 DIAGNOSIS — Z6825 Body mass index (BMI) 25.0-25.9, adult: Secondary | ICD-10-CM | POA: Diagnosis not present

## 2015-08-10 DIAGNOSIS — G894 Chronic pain syndrome: Secondary | ICD-10-CM | POA: Diagnosis not present

## 2015-08-10 DIAGNOSIS — M545 Low back pain: Secondary | ICD-10-CM | POA: Diagnosis not present

## 2015-08-10 DIAGNOSIS — Z79891 Long term (current) use of opiate analgesic: Secondary | ICD-10-CM | POA: Diagnosis not present

## 2015-08-10 DIAGNOSIS — M47816 Spondylosis without myelopathy or radiculopathy, lumbar region: Secondary | ICD-10-CM | POA: Diagnosis not present

## 2015-08-11 DIAGNOSIS — Z9049 Acquired absence of other specified parts of digestive tract: Secondary | ICD-10-CM | POA: Diagnosis not present

## 2015-08-11 DIAGNOSIS — K5730 Diverticulosis of large intestine without perforation or abscess without bleeding: Secondary | ICD-10-CM | POA: Diagnosis not present

## 2015-08-11 DIAGNOSIS — I700 Atherosclerosis of aorta: Secondary | ICD-10-CM | POA: Diagnosis not present

## 2015-08-25 DIAGNOSIS — Z4502 Encounter for adjustment and management of automatic implantable cardiac defibrillator: Secondary | ICD-10-CM | POA: Diagnosis not present

## 2015-08-25 DIAGNOSIS — I498 Other specified cardiac arrhythmias: Secondary | ICD-10-CM | POA: Diagnosis not present

## 2015-08-30 DIAGNOSIS — I10 Essential (primary) hypertension: Secondary | ICD-10-CM | POA: Diagnosis not present

## 2015-08-30 DIAGNOSIS — I429 Cardiomyopathy, unspecified: Secondary | ICD-10-CM | POA: Diagnosis not present

## 2015-08-30 DIAGNOSIS — E785 Hyperlipidemia, unspecified: Secondary | ICD-10-CM | POA: Diagnosis not present

## 2015-08-30 DIAGNOSIS — Z95810 Presence of automatic (implantable) cardiac defibrillator: Secondary | ICD-10-CM | POA: Diagnosis not present

## 2015-08-30 DIAGNOSIS — I2510 Atherosclerotic heart disease of native coronary artery without angina pectoris: Secondary | ICD-10-CM | POA: Diagnosis not present

## 2015-09-11 DIAGNOSIS — I2510 Atherosclerotic heart disease of native coronary artery without angina pectoris: Secondary | ICD-10-CM | POA: Diagnosis not present

## 2015-09-11 DIAGNOSIS — I110 Hypertensive heart disease with heart failure: Secondary | ICD-10-CM | POA: Diagnosis not present

## 2015-09-11 DIAGNOSIS — J449 Chronic obstructive pulmonary disease, unspecified: Secondary | ICD-10-CM | POA: Diagnosis not present

## 2015-09-20 DIAGNOSIS — M47816 Spondylosis without myelopathy or radiculopathy, lumbar region: Secondary | ICD-10-CM | POA: Diagnosis not present

## 2015-09-22 DIAGNOSIS — R935 Abnormal findings on diagnostic imaging of other abdominal regions, including retroperitoneum: Secondary | ICD-10-CM | POA: Diagnosis not present

## 2015-09-22 DIAGNOSIS — K5909 Other constipation: Secondary | ICD-10-CM | POA: Diagnosis not present

## 2015-09-29 DIAGNOSIS — I2510 Atherosclerotic heart disease of native coronary artery without angina pectoris: Secondary | ICD-10-CM | POA: Diagnosis not present

## 2015-09-29 DIAGNOSIS — E785 Hyperlipidemia, unspecified: Secondary | ICD-10-CM | POA: Diagnosis not present

## 2015-09-29 DIAGNOSIS — I429 Cardiomyopathy, unspecified: Secondary | ICD-10-CM | POA: Diagnosis not present

## 2015-09-29 DIAGNOSIS — I10 Essential (primary) hypertension: Secondary | ICD-10-CM | POA: Diagnosis not present

## 2015-09-29 DIAGNOSIS — Z95810 Presence of automatic (implantable) cardiac defibrillator: Secondary | ICD-10-CM | POA: Diagnosis not present

## 2015-10-12 DIAGNOSIS — Z79891 Long term (current) use of opiate analgesic: Secondary | ICD-10-CM | POA: Diagnosis not present

## 2015-10-12 DIAGNOSIS — I429 Cardiomyopathy, unspecified: Secondary | ICD-10-CM | POA: Diagnosis not present

## 2015-10-12 DIAGNOSIS — Z9181 History of falling: Secondary | ICD-10-CM | POA: Diagnosis not present

## 2015-10-12 DIAGNOSIS — Z95810 Presence of automatic (implantable) cardiac defibrillator: Secondary | ICD-10-CM | POA: Diagnosis not present

## 2015-10-12 DIAGNOSIS — J449 Chronic obstructive pulmonary disease, unspecified: Secondary | ICD-10-CM | POA: Diagnosis not present

## 2015-10-12 DIAGNOSIS — I5020 Unspecified systolic (congestive) heart failure: Secondary | ICD-10-CM | POA: Diagnosis not present

## 2015-10-12 DIAGNOSIS — Z602 Problems related to living alone: Secondary | ICD-10-CM | POA: Diagnosis not present

## 2015-10-12 DIAGNOSIS — Z7982 Long term (current) use of aspirin: Secondary | ICD-10-CM | POA: Diagnosis not present

## 2015-10-12 DIAGNOSIS — I2510 Atherosclerotic heart disease of native coronary artery without angina pectoris: Secondary | ICD-10-CM | POA: Diagnosis not present

## 2015-10-12 DIAGNOSIS — Z87891 Personal history of nicotine dependence: Secondary | ICD-10-CM | POA: Diagnosis not present

## 2015-10-12 DIAGNOSIS — I110 Hypertensive heart disease with heart failure: Secondary | ICD-10-CM | POA: Diagnosis not present

## 2015-10-12 DIAGNOSIS — K219 Gastro-esophageal reflux disease without esophagitis: Secondary | ICD-10-CM | POA: Diagnosis not present

## 2015-10-12 DIAGNOSIS — G8929 Other chronic pain: Secondary | ICD-10-CM | POA: Diagnosis not present

## 2015-10-13 DIAGNOSIS — R933 Abnormal findings on diagnostic imaging of other parts of digestive tract: Secondary | ICD-10-CM | POA: Diagnosis not present

## 2015-10-13 DIAGNOSIS — K5909 Other constipation: Secondary | ICD-10-CM | POA: Diagnosis not present

## 2015-10-13 DIAGNOSIS — K5730 Diverticulosis of large intestine without perforation or abscess without bleeding: Secondary | ICD-10-CM | POA: Diagnosis not present

## 2015-10-13 DIAGNOSIS — R109 Unspecified abdominal pain: Secondary | ICD-10-CM | POA: Diagnosis not present

## 2015-10-13 DIAGNOSIS — K648 Other hemorrhoids: Secondary | ICD-10-CM | POA: Diagnosis not present

## 2015-10-13 DIAGNOSIS — R935 Abnormal findings on diagnostic imaging of other abdominal regions, including retroperitoneum: Secondary | ICD-10-CM | POA: Diagnosis not present

## 2015-10-16 DIAGNOSIS — M5416 Radiculopathy, lumbar region: Secondary | ICD-10-CM | POA: Diagnosis not present

## 2015-10-16 DIAGNOSIS — F329 Major depressive disorder, single episode, unspecified: Secondary | ICD-10-CM | POA: Diagnosis not present

## 2015-10-16 DIAGNOSIS — Z79891 Long term (current) use of opiate analgesic: Secondary | ICD-10-CM | POA: Diagnosis not present

## 2015-10-16 DIAGNOSIS — G894 Chronic pain syndrome: Secondary | ICD-10-CM | POA: Diagnosis not present

## 2015-10-16 DIAGNOSIS — M47816 Spondylosis without myelopathy or radiculopathy, lumbar region: Secondary | ICD-10-CM | POA: Diagnosis not present

## 2015-10-16 HISTORY — PX: COLONOSCOPY: SHX5424

## 2015-10-16 LAB — HM COLONOSCOPY

## 2015-10-25 DIAGNOSIS — M47816 Spondylosis without myelopathy or radiculopathy, lumbar region: Secondary | ICD-10-CM | POA: Diagnosis not present

## 2015-11-02 DIAGNOSIS — G8929 Other chronic pain: Secondary | ICD-10-CM | POA: Diagnosis not present

## 2015-11-02 DIAGNOSIS — Z9181 History of falling: Secondary | ICD-10-CM | POA: Diagnosis not present

## 2015-11-02 DIAGNOSIS — Z7982 Long term (current) use of aspirin: Secondary | ICD-10-CM | POA: Diagnosis not present

## 2015-11-02 DIAGNOSIS — K219 Gastro-esophageal reflux disease without esophagitis: Secondary | ICD-10-CM | POA: Diagnosis not present

## 2015-11-02 DIAGNOSIS — Z87891 Personal history of nicotine dependence: Secondary | ICD-10-CM | POA: Diagnosis not present

## 2015-11-02 DIAGNOSIS — I2510 Atherosclerotic heart disease of native coronary artery without angina pectoris: Secondary | ICD-10-CM | POA: Diagnosis not present

## 2015-11-02 DIAGNOSIS — Z95810 Presence of automatic (implantable) cardiac defibrillator: Secondary | ICD-10-CM | POA: Diagnosis not present

## 2015-11-02 DIAGNOSIS — J449 Chronic obstructive pulmonary disease, unspecified: Secondary | ICD-10-CM | POA: Diagnosis not present

## 2015-11-02 DIAGNOSIS — I110 Hypertensive heart disease with heart failure: Secondary | ICD-10-CM | POA: Diagnosis not present

## 2015-11-02 DIAGNOSIS — Z602 Problems related to living alone: Secondary | ICD-10-CM | POA: Diagnosis not present

## 2015-11-02 DIAGNOSIS — I429 Cardiomyopathy, unspecified: Secondary | ICD-10-CM | POA: Diagnosis not present

## 2015-11-02 DIAGNOSIS — Z79891 Long term (current) use of opiate analgesic: Secondary | ICD-10-CM | POA: Diagnosis not present

## 2015-11-02 DIAGNOSIS — I5020 Unspecified systolic (congestive) heart failure: Secondary | ICD-10-CM | POA: Diagnosis not present

## 2015-11-13 DIAGNOSIS — H65493 Other chronic nonsuppurative otitis media, bilateral: Secondary | ICD-10-CM | POA: Diagnosis not present

## 2015-11-14 DIAGNOSIS — I10 Essential (primary) hypertension: Secondary | ICD-10-CM | POA: Diagnosis not present

## 2015-11-14 DIAGNOSIS — Z95810 Presence of automatic (implantable) cardiac defibrillator: Secondary | ICD-10-CM | POA: Diagnosis not present

## 2015-11-14 DIAGNOSIS — I429 Cardiomyopathy, unspecified: Secondary | ICD-10-CM | POA: Diagnosis not present

## 2015-11-14 DIAGNOSIS — E785 Hyperlipidemia, unspecified: Secondary | ICD-10-CM | POA: Diagnosis not present

## 2015-11-16 DIAGNOSIS — N390 Urinary tract infection, site not specified: Secondary | ICD-10-CM | POA: Diagnosis not present

## 2015-11-16 DIAGNOSIS — I5020 Unspecified systolic (congestive) heart failure: Secondary | ICD-10-CM | POA: Diagnosis not present

## 2015-11-16 DIAGNOSIS — I10 Essential (primary) hypertension: Secondary | ICD-10-CM | POA: Diagnosis not present

## 2015-11-16 DIAGNOSIS — J449 Chronic obstructive pulmonary disease, unspecified: Secondary | ICD-10-CM | POA: Diagnosis not present

## 2015-11-16 DIAGNOSIS — Z95810 Presence of automatic (implantable) cardiac defibrillator: Secondary | ICD-10-CM | POA: Diagnosis not present

## 2015-11-16 DIAGNOSIS — I110 Hypertensive heart disease with heart failure: Secondary | ICD-10-CM | POA: Diagnosis not present

## 2015-11-16 DIAGNOSIS — I2510 Atherosclerotic heart disease of native coronary artery without angina pectoris: Secondary | ICD-10-CM | POA: Diagnosis not present

## 2015-11-16 DIAGNOSIS — K219 Gastro-esophageal reflux disease without esophagitis: Secondary | ICD-10-CM | POA: Diagnosis not present

## 2015-11-16 DIAGNOSIS — I5032 Chronic diastolic (congestive) heart failure: Secondary | ICD-10-CM | POA: Diagnosis not present

## 2015-11-16 DIAGNOSIS — Z9181 History of falling: Secondary | ICD-10-CM | POA: Diagnosis not present

## 2015-11-16 DIAGNOSIS — Z7982 Long term (current) use of aspirin: Secondary | ICD-10-CM | POA: Diagnosis not present

## 2015-11-16 DIAGNOSIS — E785 Hyperlipidemia, unspecified: Secondary | ICD-10-CM | POA: Diagnosis not present

## 2015-11-16 DIAGNOSIS — E039 Hypothyroidism, unspecified: Secondary | ICD-10-CM | POA: Diagnosis not present

## 2015-11-16 DIAGNOSIS — Z602 Problems related to living alone: Secondary | ICD-10-CM | POA: Diagnosis not present

## 2015-11-16 DIAGNOSIS — Z87891 Personal history of nicotine dependence: Secondary | ICD-10-CM | POA: Diagnosis not present

## 2015-11-16 DIAGNOSIS — Z79891 Long term (current) use of opiate analgesic: Secondary | ICD-10-CM | POA: Diagnosis not present

## 2015-11-16 DIAGNOSIS — G8929 Other chronic pain: Secondary | ICD-10-CM | POA: Diagnosis not present

## 2015-11-16 DIAGNOSIS — I429 Cardiomyopathy, unspecified: Secondary | ICD-10-CM | POA: Diagnosis not present

## 2015-11-21 DIAGNOSIS — M1611 Unilateral primary osteoarthritis, right hip: Secondary | ICD-10-CM | POA: Diagnosis not present

## 2015-11-22 DIAGNOSIS — J449 Chronic obstructive pulmonary disease, unspecified: Secondary | ICD-10-CM | POA: Diagnosis not present

## 2015-11-22 DIAGNOSIS — I2510 Atherosclerotic heart disease of native coronary artery without angina pectoris: Secondary | ICD-10-CM | POA: Diagnosis not present

## 2015-11-22 DIAGNOSIS — I110 Hypertensive heart disease with heart failure: Secondary | ICD-10-CM | POA: Diagnosis not present

## 2015-11-24 DIAGNOSIS — Z4502 Encounter for adjustment and management of automatic implantable cardiac defibrillator: Secondary | ICD-10-CM | POA: Diagnosis not present

## 2015-11-24 DIAGNOSIS — M6281 Muscle weakness (generalized): Secondary | ICD-10-CM | POA: Diagnosis not present

## 2015-11-24 DIAGNOSIS — M47816 Spondylosis without myelopathy or radiculopathy, lumbar region: Secondary | ICD-10-CM | POA: Diagnosis not present

## 2015-11-24 DIAGNOSIS — R262 Difficulty in walking, not elsewhere classified: Secondary | ICD-10-CM | POA: Diagnosis not present

## 2015-11-24 DIAGNOSIS — Z79891 Long term (current) use of opiate analgesic: Secondary | ICD-10-CM | POA: Diagnosis not present

## 2015-11-24 DIAGNOSIS — G894 Chronic pain syndrome: Secondary | ICD-10-CM | POA: Diagnosis not present

## 2015-11-24 DIAGNOSIS — M25569 Pain in unspecified knee: Secondary | ICD-10-CM | POA: Diagnosis not present

## 2015-11-24 DIAGNOSIS — I429 Cardiomyopathy, unspecified: Secondary | ICD-10-CM | POA: Diagnosis not present

## 2015-11-27 DIAGNOSIS — M1611 Unilateral primary osteoarthritis, right hip: Secondary | ICD-10-CM | POA: Diagnosis not present

## 2015-11-27 DIAGNOSIS — M25551 Pain in right hip: Secondary | ICD-10-CM | POA: Diagnosis not present

## 2015-12-06 DIAGNOSIS — I2510 Atherosclerotic heart disease of native coronary artery without angina pectoris: Secondary | ICD-10-CM | POA: Diagnosis not present

## 2015-12-06 DIAGNOSIS — Z9181 History of falling: Secondary | ICD-10-CM | POA: Diagnosis not present

## 2015-12-06 DIAGNOSIS — K219 Gastro-esophageal reflux disease without esophagitis: Secondary | ICD-10-CM | POA: Diagnosis not present

## 2015-12-06 DIAGNOSIS — Z95810 Presence of automatic (implantable) cardiac defibrillator: Secondary | ICD-10-CM | POA: Diagnosis not present

## 2015-12-06 DIAGNOSIS — Z79891 Long term (current) use of opiate analgesic: Secondary | ICD-10-CM | POA: Diagnosis not present

## 2015-12-06 DIAGNOSIS — I110 Hypertensive heart disease with heart failure: Secondary | ICD-10-CM | POA: Diagnosis not present

## 2015-12-06 DIAGNOSIS — Z7982 Long term (current) use of aspirin: Secondary | ICD-10-CM | POA: Diagnosis not present

## 2015-12-06 DIAGNOSIS — Z602 Problems related to living alone: Secondary | ICD-10-CM | POA: Diagnosis not present

## 2015-12-06 DIAGNOSIS — I5020 Unspecified systolic (congestive) heart failure: Secondary | ICD-10-CM | POA: Diagnosis not present

## 2015-12-06 DIAGNOSIS — G8929 Other chronic pain: Secondary | ICD-10-CM | POA: Diagnosis not present

## 2015-12-06 DIAGNOSIS — Z87891 Personal history of nicotine dependence: Secondary | ICD-10-CM | POA: Diagnosis not present

## 2015-12-06 DIAGNOSIS — I429 Cardiomyopathy, unspecified: Secondary | ICD-10-CM | POA: Diagnosis not present

## 2015-12-06 DIAGNOSIS — J449 Chronic obstructive pulmonary disease, unspecified: Secondary | ICD-10-CM | POA: Diagnosis not present

## 2015-12-11 DIAGNOSIS — J441 Chronic obstructive pulmonary disease with (acute) exacerbation: Secondary | ICD-10-CM | POA: Diagnosis not present

## 2015-12-22 DIAGNOSIS — M5416 Radiculopathy, lumbar region: Secondary | ICD-10-CM | POA: Diagnosis not present

## 2015-12-22 DIAGNOSIS — M47816 Spondylosis without myelopathy or radiculopathy, lumbar region: Secondary | ICD-10-CM | POA: Diagnosis not present

## 2015-12-22 DIAGNOSIS — Z79891 Long term (current) use of opiate analgesic: Secondary | ICD-10-CM | POA: Diagnosis not present

## 2015-12-29 DIAGNOSIS — K5909 Other constipation: Secondary | ICD-10-CM | POA: Diagnosis not present

## 2016-01-04 DIAGNOSIS — M1611 Unilateral primary osteoarthritis, right hip: Secondary | ICD-10-CM | POA: Diagnosis not present

## 2016-01-10 DIAGNOSIS — M25561 Pain in right knee: Secondary | ICD-10-CM | POA: Diagnosis not present

## 2016-01-19 DIAGNOSIS — M47816 Spondylosis without myelopathy or radiculopathy, lumbar region: Secondary | ICD-10-CM | POA: Diagnosis not present

## 2016-01-19 DIAGNOSIS — F329 Major depressive disorder, single episode, unspecified: Secondary | ICD-10-CM | POA: Diagnosis not present

## 2016-01-19 DIAGNOSIS — Z79891 Long term (current) use of opiate analgesic: Secondary | ICD-10-CM | POA: Diagnosis not present

## 2016-02-15 ENCOUNTER — Encounter: Payer: Self-pay | Admitting: Legal Medicine

## 2016-02-20 DIAGNOSIS — Z95810 Presence of automatic (implantable) cardiac defibrillator: Secondary | ICD-10-CM | POA: Diagnosis not present

## 2016-02-20 DIAGNOSIS — I10 Essential (primary) hypertension: Secondary | ICD-10-CM | POA: Diagnosis not present

## 2016-02-20 DIAGNOSIS — I429 Cardiomyopathy, unspecified: Secondary | ICD-10-CM | POA: Diagnosis not present

## 2016-02-20 DIAGNOSIS — I2510 Atherosclerotic heart disease of native coronary artery without angina pectoris: Secondary | ICD-10-CM | POA: Diagnosis not present

## 2016-02-20 DIAGNOSIS — E785 Hyperlipidemia, unspecified: Secondary | ICD-10-CM | POA: Diagnosis not present

## 2016-02-21 DIAGNOSIS — M47816 Spondylosis without myelopathy or radiculopathy, lumbar region: Secondary | ICD-10-CM | POA: Diagnosis not present

## 2016-02-21 DIAGNOSIS — F329 Major depressive disorder, single episode, unspecified: Secondary | ICD-10-CM | POA: Diagnosis not present

## 2016-02-21 DIAGNOSIS — G894 Chronic pain syndrome: Secondary | ICD-10-CM | POA: Diagnosis not present

## 2016-02-22 DIAGNOSIS — J449 Chronic obstructive pulmonary disease, unspecified: Secondary | ICD-10-CM | POA: Diagnosis not present

## 2016-02-22 DIAGNOSIS — E785 Hyperlipidemia, unspecified: Secondary | ICD-10-CM | POA: Diagnosis not present

## 2016-02-22 DIAGNOSIS — E039 Hypothyroidism, unspecified: Secondary | ICD-10-CM | POA: Diagnosis not present

## 2016-02-22 DIAGNOSIS — K219 Gastro-esophageal reflux disease without esophagitis: Secondary | ICD-10-CM | POA: Diagnosis not present

## 2016-02-22 DIAGNOSIS — I10 Essential (primary) hypertension: Secondary | ICD-10-CM | POA: Diagnosis not present

## 2016-02-22 DIAGNOSIS — Z6825 Body mass index (BMI) 25.0-25.9, adult: Secondary | ICD-10-CM | POA: Diagnosis not present

## 2016-02-22 DIAGNOSIS — I5020 Unspecified systolic (congestive) heart failure: Secondary | ICD-10-CM | POA: Diagnosis not present

## 2016-02-23 DIAGNOSIS — Z95810 Presence of automatic (implantable) cardiac defibrillator: Secondary | ICD-10-CM | POA: Diagnosis not present

## 2016-02-29 DIAGNOSIS — E875 Hyperkalemia: Secondary | ICD-10-CM | POA: Diagnosis not present

## 2016-03-13 DIAGNOSIS — M1711 Unilateral primary osteoarthritis, right knee: Secondary | ICD-10-CM | POA: Diagnosis not present

## 2016-03-13 DIAGNOSIS — M25561 Pain in right knee: Secondary | ICD-10-CM | POA: Diagnosis not present

## 2016-03-19 DIAGNOSIS — E039 Hypothyroidism, unspecified: Secondary | ICD-10-CM | POA: Diagnosis not present

## 2016-03-20 DIAGNOSIS — M1711 Unilateral primary osteoarthritis, right knee: Secondary | ICD-10-CM | POA: Diagnosis not present

## 2016-03-25 DIAGNOSIS — G894 Chronic pain syndrome: Secondary | ICD-10-CM | POA: Diagnosis not present

## 2016-03-25 DIAGNOSIS — M47816 Spondylosis without myelopathy or radiculopathy, lumbar region: Secondary | ICD-10-CM | POA: Diagnosis not present

## 2016-03-25 DIAGNOSIS — F329 Major depressive disorder, single episode, unspecified: Secondary | ICD-10-CM | POA: Diagnosis not present

## 2016-03-29 DIAGNOSIS — H9202 Otalgia, left ear: Secondary | ICD-10-CM | POA: Diagnosis not present

## 2016-03-29 DIAGNOSIS — H65493 Other chronic nonsuppurative otitis media, bilateral: Secondary | ICD-10-CM | POA: Diagnosis not present

## 2016-04-24 DIAGNOSIS — F329 Major depressive disorder, single episode, unspecified: Secondary | ICD-10-CM | POA: Diagnosis not present

## 2016-04-24 DIAGNOSIS — M47816 Spondylosis without myelopathy or radiculopathy, lumbar region: Secondary | ICD-10-CM | POA: Diagnosis not present

## 2016-04-24 DIAGNOSIS — G894 Chronic pain syndrome: Secondary | ICD-10-CM | POA: Diagnosis not present

## 2016-04-26 DIAGNOSIS — M1611 Unilateral primary osteoarthritis, right hip: Secondary | ICD-10-CM | POA: Diagnosis not present

## 2016-05-08 DIAGNOSIS — M1711 Unilateral primary osteoarthritis, right knee: Secondary | ICD-10-CM | POA: Diagnosis not present

## 2016-05-08 DIAGNOSIS — M25561 Pain in right knee: Secondary | ICD-10-CM | POA: Diagnosis not present

## 2016-05-09 DIAGNOSIS — M25551 Pain in right hip: Secondary | ICD-10-CM | POA: Diagnosis not present

## 2016-05-13 DIAGNOSIS — M1611 Unilateral primary osteoarthritis, right hip: Secondary | ICD-10-CM | POA: Diagnosis not present

## 2016-05-22 DIAGNOSIS — I10 Essential (primary) hypertension: Secondary | ICD-10-CM | POA: Diagnosis not present

## 2016-05-22 DIAGNOSIS — E785 Hyperlipidemia, unspecified: Secondary | ICD-10-CM | POA: Diagnosis not present

## 2016-05-22 DIAGNOSIS — I2510 Atherosclerotic heart disease of native coronary artery without angina pectoris: Secondary | ICD-10-CM | POA: Diagnosis not present

## 2016-05-22 DIAGNOSIS — Z95810 Presence of automatic (implantable) cardiac defibrillator: Secondary | ICD-10-CM | POA: Diagnosis not present

## 2016-05-22 DIAGNOSIS — I255 Ischemic cardiomyopathy: Secondary | ICD-10-CM | POA: Diagnosis not present

## 2016-05-27 DIAGNOSIS — M47816 Spondylosis without myelopathy or radiculopathy, lumbar region: Secondary | ICD-10-CM | POA: Diagnosis not present

## 2016-05-27 DIAGNOSIS — Z79891 Long term (current) use of opiate analgesic: Secondary | ICD-10-CM | POA: Diagnosis not present

## 2016-05-27 DIAGNOSIS — G894 Chronic pain syndrome: Secondary | ICD-10-CM | POA: Diagnosis not present

## 2016-05-28 DIAGNOSIS — M8548 Solitary bone cyst, other site: Secondary | ICD-10-CM | POA: Diagnosis not present

## 2016-05-28 DIAGNOSIS — R799 Abnormal finding of blood chemistry, unspecified: Secondary | ICD-10-CM | POA: Diagnosis not present

## 2016-05-28 DIAGNOSIS — M1611 Unilateral primary osteoarthritis, right hip: Secondary | ICD-10-CM | POA: Diagnosis not present

## 2016-05-29 DIAGNOSIS — M8548 Solitary bone cyst, other site: Secondary | ICD-10-CM | POA: Diagnosis not present

## 2016-05-29 DIAGNOSIS — M1611 Unilateral primary osteoarthritis, right hip: Secondary | ICD-10-CM | POA: Insufficient documentation

## 2016-05-29 DIAGNOSIS — R799 Abnormal finding of blood chemistry, unspecified: Secondary | ICD-10-CM | POA: Diagnosis not present

## 2016-05-29 HISTORY — DX: Unilateral primary osteoarthritis, right hip: M16.11

## 2016-06-05 DIAGNOSIS — M47816 Spondylosis without myelopathy or radiculopathy, lumbar region: Secondary | ICD-10-CM | POA: Diagnosis not present

## 2016-06-05 DIAGNOSIS — G894 Chronic pain syndrome: Secondary | ICD-10-CM | POA: Diagnosis not present

## 2016-06-06 DIAGNOSIS — I25119 Atherosclerotic heart disease of native coronary artery with unspecified angina pectoris: Secondary | ICD-10-CM | POA: Diagnosis not present

## 2016-06-06 DIAGNOSIS — E785 Hyperlipidemia, unspecified: Secondary | ICD-10-CM | POA: Diagnosis not present

## 2016-06-06 DIAGNOSIS — I255 Ischemic cardiomyopathy: Secondary | ICD-10-CM | POA: Diagnosis not present

## 2016-06-06 DIAGNOSIS — Z95810 Presence of automatic (implantable) cardiac defibrillator: Secondary | ICD-10-CM | POA: Diagnosis not present

## 2016-06-06 DIAGNOSIS — I10 Essential (primary) hypertension: Secondary | ICD-10-CM | POA: Diagnosis not present

## 2016-06-11 DIAGNOSIS — Z23 Encounter for immunization: Secondary | ICD-10-CM | POA: Diagnosis not present

## 2016-06-18 DIAGNOSIS — I2510 Atherosclerotic heart disease of native coronary artery without angina pectoris: Secondary | ICD-10-CM | POA: Diagnosis not present

## 2016-06-18 DIAGNOSIS — E785 Hyperlipidemia, unspecified: Secondary | ICD-10-CM | POA: Diagnosis not present

## 2016-06-18 DIAGNOSIS — Z95810 Presence of automatic (implantable) cardiac defibrillator: Secondary | ICD-10-CM | POA: Diagnosis not present

## 2016-06-18 DIAGNOSIS — I10 Essential (primary) hypertension: Secondary | ICD-10-CM | POA: Diagnosis not present

## 2016-06-18 DIAGNOSIS — I255 Ischemic cardiomyopathy: Secondary | ICD-10-CM | POA: Diagnosis not present

## 2016-06-25 DIAGNOSIS — M1712 Unilateral primary osteoarthritis, left knee: Secondary | ICD-10-CM | POA: Diagnosis not present

## 2016-06-26 DIAGNOSIS — Z79891 Long term (current) use of opiate analgesic: Secondary | ICD-10-CM | POA: Diagnosis not present

## 2016-06-26 DIAGNOSIS — M47816 Spondylosis without myelopathy or radiculopathy, lumbar region: Secondary | ICD-10-CM | POA: Diagnosis not present

## 2016-06-26 DIAGNOSIS — N390 Urinary tract infection, site not specified: Secondary | ICD-10-CM | POA: Diagnosis not present

## 2016-06-26 DIAGNOSIS — G894 Chronic pain syndrome: Secondary | ICD-10-CM | POA: Diagnosis not present

## 2016-06-27 DIAGNOSIS — Z95810 Presence of automatic (implantable) cardiac defibrillator: Secondary | ICD-10-CM | POA: Diagnosis not present

## 2016-06-27 DIAGNOSIS — E785 Hyperlipidemia, unspecified: Secondary | ICD-10-CM | POA: Diagnosis not present

## 2016-06-27 DIAGNOSIS — I2510 Atherosclerotic heart disease of native coronary artery without angina pectoris: Secondary | ICD-10-CM | POA: Diagnosis not present

## 2016-06-27 DIAGNOSIS — I10 Essential (primary) hypertension: Secondary | ICD-10-CM | POA: Diagnosis not present

## 2016-06-27 DIAGNOSIS — I255 Ischemic cardiomyopathy: Secondary | ICD-10-CM | POA: Diagnosis not present

## 2016-07-05 DIAGNOSIS — H65493 Other chronic nonsuppurative otitis media, bilateral: Secondary | ICD-10-CM | POA: Diagnosis not present

## 2016-07-29 DIAGNOSIS — Z96641 Presence of right artificial hip joint: Secondary | ICD-10-CM | POA: Diagnosis not present

## 2016-07-29 DIAGNOSIS — M4306 Spondylolysis, lumbar region: Secondary | ICD-10-CM | POA: Diagnosis not present

## 2016-07-29 DIAGNOSIS — G8918 Other acute postprocedural pain: Secondary | ICD-10-CM | POA: Diagnosis not present

## 2016-07-29 DIAGNOSIS — M1611 Unilateral primary osteoarthritis, right hip: Secondary | ICD-10-CM | POA: Diagnosis not present

## 2016-07-29 DIAGNOSIS — M533 Sacrococcygeal disorders, not elsewhere classified: Secondary | ICD-10-CM | POA: Diagnosis not present

## 2016-07-29 DIAGNOSIS — M25551 Pain in right hip: Secondary | ICD-10-CM | POA: Diagnosis not present

## 2016-07-30 DIAGNOSIS — Z79891 Long term (current) use of opiate analgesic: Secondary | ICD-10-CM | POA: Diagnosis not present

## 2016-07-30 DIAGNOSIS — G8929 Other chronic pain: Secondary | ICD-10-CM | POA: Diagnosis not present

## 2016-07-30 DIAGNOSIS — G8918 Other acute postprocedural pain: Secondary | ICD-10-CM | POA: Diagnosis not present

## 2016-07-31 DIAGNOSIS — I509 Heart failure, unspecified: Secondary | ICD-10-CM | POA: Diagnosis not present

## 2016-07-31 DIAGNOSIS — Z79891 Long term (current) use of opiate analgesic: Secondary | ICD-10-CM | POA: Diagnosis not present

## 2016-07-31 DIAGNOSIS — G8918 Other acute postprocedural pain: Secondary | ICD-10-CM | POA: Diagnosis not present

## 2016-07-31 DIAGNOSIS — J449 Chronic obstructive pulmonary disease, unspecified: Secondary | ICD-10-CM | POA: Diagnosis not present

## 2016-07-31 DIAGNOSIS — I498 Other specified cardiac arrhythmias: Secondary | ICD-10-CM | POA: Diagnosis not present

## 2016-07-31 DIAGNOSIS — I2510 Atherosclerotic heart disease of native coronary artery without angina pectoris: Secondary | ICD-10-CM | POA: Diagnosis not present

## 2016-07-31 DIAGNOSIS — R079 Chest pain, unspecified: Secondary | ICD-10-CM | POA: Diagnosis not present

## 2016-07-31 DIAGNOSIS — I517 Cardiomegaly: Secondary | ICD-10-CM | POA: Diagnosis not present

## 2016-07-31 DIAGNOSIS — G8929 Other chronic pain: Secondary | ICD-10-CM | POA: Diagnosis not present

## 2016-07-31 DIAGNOSIS — I10 Essential (primary) hypertension: Secondary | ICD-10-CM | POA: Diagnosis not present

## 2016-08-01 DIAGNOSIS — M4306 Spondylolysis, lumbar region: Secondary | ICD-10-CM | POA: Diagnosis not present

## 2016-08-01 DIAGNOSIS — M533 Sacrococcygeal disorders, not elsewhere classified: Secondary | ICD-10-CM | POA: Diagnosis not present

## 2016-08-01 DIAGNOSIS — M50323 Other cervical disc degeneration at C6-C7 level: Secondary | ICD-10-CM | POA: Diagnosis not present

## 2016-08-01 DIAGNOSIS — M25551 Pain in right hip: Secondary | ICD-10-CM | POA: Diagnosis not present

## 2016-08-01 DIAGNOSIS — Z79891 Long term (current) use of opiate analgesic: Secondary | ICD-10-CM | POA: Diagnosis not present

## 2016-08-01 DIAGNOSIS — M1611 Unilateral primary osteoarthritis, right hip: Secondary | ICD-10-CM | POA: Diagnosis not present

## 2016-08-01 DIAGNOSIS — G8929 Other chronic pain: Secondary | ICD-10-CM | POA: Diagnosis not present

## 2016-08-01 DIAGNOSIS — R29898 Other symptoms and signs involving the musculoskeletal system: Secondary | ICD-10-CM | POA: Diagnosis not present

## 2016-08-01 DIAGNOSIS — I10 Essential (primary) hypertension: Secondary | ICD-10-CM | POA: Diagnosis not present

## 2016-08-01 DIAGNOSIS — Z96641 Presence of right artificial hip joint: Secondary | ICD-10-CM | POA: Diagnosis not present

## 2016-08-01 DIAGNOSIS — J449 Chronic obstructive pulmonary disease, unspecified: Secondary | ICD-10-CM | POA: Diagnosis not present

## 2016-08-01 DIAGNOSIS — R32 Unspecified urinary incontinence: Secondary | ICD-10-CM | POA: Diagnosis not present

## 2016-08-02 DIAGNOSIS — Z7409 Other reduced mobility: Secondary | ICD-10-CM | POA: Diagnosis not present

## 2016-08-02 DIAGNOSIS — Z471 Aftercare following joint replacement surgery: Secondary | ICD-10-CM | POA: Diagnosis not present

## 2016-08-02 DIAGNOSIS — M1611 Unilateral primary osteoarthritis, right hip: Secondary | ICD-10-CM | POA: Diagnosis not present

## 2016-08-02 DIAGNOSIS — F339 Major depressive disorder, recurrent, unspecified: Secondary | ICD-10-CM | POA: Diagnosis not present

## 2016-08-02 DIAGNOSIS — E785 Hyperlipidemia, unspecified: Secondary | ICD-10-CM | POA: Diagnosis not present

## 2016-08-02 DIAGNOSIS — I10 Essential (primary) hypertension: Secondary | ICD-10-CM | POA: Diagnosis not present

## 2016-08-02 DIAGNOSIS — F338 Other recurrent depressive disorders: Secondary | ICD-10-CM | POA: Diagnosis not present

## 2016-08-02 DIAGNOSIS — I25119 Atherosclerotic heart disease of native coronary artery with unspecified angina pectoris: Secondary | ICD-10-CM | POA: Diagnosis not present

## 2016-08-02 DIAGNOSIS — M1990 Unspecified osteoarthritis, unspecified site: Secondary | ICD-10-CM | POA: Diagnosis not present

## 2016-08-02 DIAGNOSIS — M6281 Muscle weakness (generalized): Secondary | ICD-10-CM | POA: Diagnosis not present

## 2016-08-02 DIAGNOSIS — R262 Difficulty in walking, not elsewhere classified: Secondary | ICD-10-CM | POA: Diagnosis not present

## 2016-08-02 DIAGNOSIS — Z79891 Long term (current) use of opiate analgesic: Secondary | ICD-10-CM | POA: Diagnosis not present

## 2016-08-02 DIAGNOSIS — K219 Gastro-esophageal reflux disease without esophagitis: Secondary | ICD-10-CM | POA: Diagnosis not present

## 2016-08-02 DIAGNOSIS — J449 Chronic obstructive pulmonary disease, unspecified: Secondary | ICD-10-CM | POA: Diagnosis not present

## 2016-08-02 DIAGNOSIS — R079 Chest pain, unspecified: Secondary | ICD-10-CM | POA: Diagnosis not present

## 2016-08-02 DIAGNOSIS — K5900 Constipation, unspecified: Secondary | ICD-10-CM | POA: Diagnosis not present

## 2016-08-02 DIAGNOSIS — R32 Unspecified urinary incontinence: Secondary | ICD-10-CM | POA: Diagnosis not present

## 2016-08-02 DIAGNOSIS — Z96641 Presence of right artificial hip joint: Secondary | ICD-10-CM | POA: Diagnosis not present

## 2016-08-02 DIAGNOSIS — M25551 Pain in right hip: Secondary | ICD-10-CM | POA: Diagnosis not present

## 2016-08-02 DIAGNOSIS — G8929 Other chronic pain: Secondary | ICD-10-CM | POA: Diagnosis not present

## 2016-08-05 DIAGNOSIS — I10 Essential (primary) hypertension: Secondary | ICD-10-CM | POA: Diagnosis not present

## 2016-08-05 DIAGNOSIS — M1611 Unilateral primary osteoarthritis, right hip: Secondary | ICD-10-CM | POA: Diagnosis not present

## 2016-08-05 DIAGNOSIS — J449 Chronic obstructive pulmonary disease, unspecified: Secondary | ICD-10-CM | POA: Diagnosis not present

## 2016-08-05 DIAGNOSIS — F338 Other recurrent depressive disorders: Secondary | ICD-10-CM | POA: Diagnosis not present

## 2016-08-12 DIAGNOSIS — M1611 Unilateral primary osteoarthritis, right hip: Secondary | ICD-10-CM | POA: Diagnosis not present

## 2016-08-12 DIAGNOSIS — J449 Chronic obstructive pulmonary disease, unspecified: Secondary | ICD-10-CM | POA: Diagnosis not present

## 2016-08-12 DIAGNOSIS — I10 Essential (primary) hypertension: Secondary | ICD-10-CM | POA: Diagnosis not present

## 2016-08-12 DIAGNOSIS — I25119 Atherosclerotic heart disease of native coronary artery with unspecified angina pectoris: Secondary | ICD-10-CM | POA: Diagnosis not present

## 2016-08-13 DIAGNOSIS — I10 Essential (primary) hypertension: Secondary | ICD-10-CM | POA: Diagnosis not present

## 2016-08-13 DIAGNOSIS — J449 Chronic obstructive pulmonary disease, unspecified: Secondary | ICD-10-CM | POA: Diagnosis not present

## 2016-08-13 DIAGNOSIS — K219 Gastro-esophageal reflux disease without esophagitis: Secondary | ICD-10-CM | POA: Diagnosis not present

## 2016-08-21 DIAGNOSIS — M1611 Unilateral primary osteoarthritis, right hip: Secondary | ICD-10-CM | POA: Diagnosis not present

## 2016-08-21 DIAGNOSIS — J449 Chronic obstructive pulmonary disease, unspecified: Secondary | ICD-10-CM | POA: Diagnosis not present

## 2016-08-21 DIAGNOSIS — K219 Gastro-esophageal reflux disease without esophagitis: Secondary | ICD-10-CM | POA: Diagnosis not present

## 2016-08-21 DIAGNOSIS — I25119 Atherosclerotic heart disease of native coronary artery with unspecified angina pectoris: Secondary | ICD-10-CM | POA: Diagnosis not present

## 2016-08-23 DIAGNOSIS — I10 Essential (primary) hypertension: Secondary | ICD-10-CM | POA: Diagnosis not present

## 2016-08-23 DIAGNOSIS — I25119 Atherosclerotic heart disease of native coronary artery with unspecified angina pectoris: Secondary | ICD-10-CM | POA: Diagnosis not present

## 2016-08-23 DIAGNOSIS — J449 Chronic obstructive pulmonary disease, unspecified: Secondary | ICD-10-CM | POA: Diagnosis not present

## 2016-08-23 DIAGNOSIS — M1611 Unilateral primary osteoarthritis, right hip: Secondary | ICD-10-CM | POA: Diagnosis not present

## 2016-09-02 DIAGNOSIS — Z95810 Presence of automatic (implantable) cardiac defibrillator: Secondary | ICD-10-CM | POA: Diagnosis not present

## 2016-09-06 DIAGNOSIS — M1711 Unilateral primary osteoarthritis, right knee: Secondary | ICD-10-CM | POA: Diagnosis not present

## 2016-09-25 DIAGNOSIS — G894 Chronic pain syndrome: Secondary | ICD-10-CM | POA: Diagnosis not present

## 2016-09-25 DIAGNOSIS — M545 Low back pain: Secondary | ICD-10-CM | POA: Diagnosis not present

## 2016-09-25 DIAGNOSIS — Z96649 Presence of unspecified artificial hip joint: Secondary | ICD-10-CM

## 2016-09-25 DIAGNOSIS — Z96641 Presence of right artificial hip joint: Secondary | ICD-10-CM | POA: Diagnosis not present

## 2016-09-25 DIAGNOSIS — Z471 Aftercare following joint replacement surgery: Secondary | ICD-10-CM | POA: Diagnosis not present

## 2016-09-25 DIAGNOSIS — M47816 Spondylosis without myelopathy or radiculopathy, lumbar region: Secondary | ICD-10-CM | POA: Diagnosis not present

## 2016-09-25 DIAGNOSIS — Z79891 Long term (current) use of opiate analgesic: Secondary | ICD-10-CM | POA: Diagnosis not present

## 2016-09-25 DIAGNOSIS — G8929 Other chronic pain: Secondary | ICD-10-CM | POA: Diagnosis not present

## 2016-09-25 DIAGNOSIS — M539 Dorsopathy, unspecified: Secondary | ICD-10-CM | POA: Diagnosis not present

## 2016-09-25 HISTORY — DX: Presence of unspecified artificial hip joint: Z96.649

## 2016-09-30 DIAGNOSIS — Z96642 Presence of left artificial hip joint: Secondary | ICD-10-CM | POA: Diagnosis not present

## 2016-09-30 DIAGNOSIS — J449 Chronic obstructive pulmonary disease, unspecified: Secondary | ICD-10-CM | POA: Diagnosis not present

## 2016-10-04 DIAGNOSIS — M1711 Unilateral primary osteoarthritis, right knee: Secondary | ICD-10-CM | POA: Diagnosis not present

## 2016-10-07 DIAGNOSIS — I2510 Atherosclerotic heart disease of native coronary artery without angina pectoris: Secondary | ICD-10-CM | POA: Diagnosis not present

## 2016-10-07 DIAGNOSIS — I10 Essential (primary) hypertension: Secondary | ICD-10-CM | POA: Diagnosis not present

## 2016-10-07 DIAGNOSIS — E785 Hyperlipidemia, unspecified: Secondary | ICD-10-CM | POA: Diagnosis not present

## 2016-10-07 DIAGNOSIS — Z95810 Presence of automatic (implantable) cardiac defibrillator: Secondary | ICD-10-CM | POA: Diagnosis not present

## 2016-10-07 DIAGNOSIS — I420 Dilated cardiomyopathy: Secondary | ICD-10-CM | POA: Diagnosis not present

## 2016-10-11 DIAGNOSIS — J101 Influenza due to other identified influenza virus with other respiratory manifestations: Secondary | ICD-10-CM | POA: Diagnosis not present

## 2016-10-14 DIAGNOSIS — L03115 Cellulitis of right lower limb: Secondary | ICD-10-CM | POA: Diagnosis not present

## 2016-10-22 DIAGNOSIS — I509 Heart failure, unspecified: Secondary | ICD-10-CM | POA: Diagnosis not present

## 2016-10-22 DIAGNOSIS — M6281 Muscle weakness (generalized): Secondary | ICD-10-CM | POA: Diagnosis not present

## 2016-10-22 DIAGNOSIS — G894 Chronic pain syndrome: Secondary | ICD-10-CM | POA: Diagnosis not present

## 2016-10-22 DIAGNOSIS — Z7982 Long term (current) use of aspirin: Secondary | ICD-10-CM | POA: Diagnosis not present

## 2016-10-22 DIAGNOSIS — Z87891 Personal history of nicotine dependence: Secondary | ICD-10-CM | POA: Diagnosis not present

## 2016-10-22 DIAGNOSIS — Z96641 Presence of right artificial hip joint: Secondary | ICD-10-CM | POA: Diagnosis not present

## 2016-10-22 DIAGNOSIS — Z599 Problem related to housing and economic circumstances, unspecified: Secondary | ICD-10-CM | POA: Diagnosis not present

## 2016-10-22 DIAGNOSIS — I252 Old myocardial infarction: Secondary | ICD-10-CM | POA: Diagnosis not present

## 2016-10-22 DIAGNOSIS — R531 Weakness: Secondary | ICD-10-CM | POA: Diagnosis not present

## 2016-10-22 DIAGNOSIS — Z7951 Long term (current) use of inhaled steroids: Secondary | ICD-10-CM | POA: Diagnosis not present

## 2016-10-22 DIAGNOSIS — M47816 Spondylosis without myelopathy or radiculopathy, lumbar region: Secondary | ICD-10-CM | POA: Diagnosis not present

## 2016-10-22 DIAGNOSIS — Z79891 Long term (current) use of opiate analgesic: Secondary | ICD-10-CM | POA: Diagnosis not present

## 2016-10-22 DIAGNOSIS — Z7902 Long term (current) use of antithrombotics/antiplatelets: Secondary | ICD-10-CM | POA: Diagnosis not present

## 2016-10-22 DIAGNOSIS — J449 Chronic obstructive pulmonary disease, unspecified: Secondary | ICD-10-CM | POA: Diagnosis not present

## 2016-10-22 DIAGNOSIS — R200 Anesthesia of skin: Secondary | ICD-10-CM | POA: Diagnosis not present

## 2016-10-24 DIAGNOSIS — M7989 Other specified soft tissue disorders: Secondary | ICD-10-CM | POA: Diagnosis not present

## 2016-10-24 DIAGNOSIS — M1711 Unilateral primary osteoarthritis, right knee: Secondary | ICD-10-CM | POA: Diagnosis not present

## 2016-10-24 DIAGNOSIS — L03115 Cellulitis of right lower limb: Secondary | ICD-10-CM | POA: Diagnosis not present

## 2016-11-01 DIAGNOSIS — Z9889 Other specified postprocedural states: Secondary | ICD-10-CM | POA: Diagnosis not present

## 2016-11-01 DIAGNOSIS — H6983 Other specified disorders of Eustachian tube, bilateral: Secondary | ICD-10-CM | POA: Diagnosis not present

## 2016-11-15 DIAGNOSIS — M1711 Unilateral primary osteoarthritis, right knee: Secondary | ICD-10-CM | POA: Diagnosis not present

## 2016-11-15 DIAGNOSIS — M25561 Pain in right knee: Secondary | ICD-10-CM | POA: Diagnosis not present

## 2016-11-27 DIAGNOSIS — M85861 Other specified disorders of bone density and structure, right lower leg: Secondary | ICD-10-CM | POA: Diagnosis not present

## 2016-11-27 DIAGNOSIS — M1711 Unilateral primary osteoarthritis, right knee: Secondary | ICD-10-CM

## 2016-11-27 DIAGNOSIS — R531 Weakness: Secondary | ICD-10-CM | POA: Diagnosis not present

## 2016-11-27 DIAGNOSIS — M25561 Pain in right knee: Secondary | ICD-10-CM | POA: Diagnosis not present

## 2016-11-27 DIAGNOSIS — Z4789 Encounter for other orthopedic aftercare: Secondary | ICD-10-CM | POA: Diagnosis not present

## 2016-11-27 HISTORY — DX: Unilateral primary osteoarthritis, right knee: M17.11

## 2016-11-28 DIAGNOSIS — M25561 Pain in right knee: Secondary | ICD-10-CM | POA: Diagnosis not present

## 2016-11-29 DIAGNOSIS — F329 Major depressive disorder, single episode, unspecified: Secondary | ICD-10-CM | POA: Diagnosis not present

## 2016-11-29 DIAGNOSIS — M8589 Other specified disorders of bone density and structure, multiple sites: Secondary | ICD-10-CM | POA: Diagnosis not present

## 2016-11-29 DIAGNOSIS — E782 Mixed hyperlipidemia: Secondary | ICD-10-CM | POA: Diagnosis not present

## 2016-11-29 DIAGNOSIS — J449 Chronic obstructive pulmonary disease, unspecified: Secondary | ICD-10-CM | POA: Diagnosis not present

## 2016-11-29 DIAGNOSIS — L03115 Cellulitis of right lower limb: Secondary | ICD-10-CM | POA: Diagnosis not present

## 2016-11-29 DIAGNOSIS — I110 Hypertensive heart disease with heart failure: Secondary | ICD-10-CM | POA: Diagnosis not present

## 2016-11-29 DIAGNOSIS — M1711 Unilateral primary osteoarthritis, right knee: Secondary | ICD-10-CM | POA: Diagnosis not present

## 2016-11-29 DIAGNOSIS — I5022 Chronic systolic (congestive) heart failure: Secondary | ICD-10-CM | POA: Diagnosis not present

## 2016-11-29 DIAGNOSIS — I10 Essential (primary) hypertension: Secondary | ICD-10-CM | POA: Diagnosis not present

## 2016-12-02 DIAGNOSIS — Z95810 Presence of automatic (implantable) cardiac defibrillator: Secondary | ICD-10-CM | POA: Diagnosis not present

## 2016-12-20 DIAGNOSIS — M810 Age-related osteoporosis without current pathological fracture: Secondary | ICD-10-CM | POA: Diagnosis not present

## 2016-12-20 DIAGNOSIS — N959 Unspecified menopausal and perimenopausal disorder: Secondary | ICD-10-CM | POA: Diagnosis not present

## 2016-12-20 DIAGNOSIS — M818 Other osteoporosis without current pathological fracture: Secondary | ICD-10-CM | POA: Diagnosis not present

## 2016-12-24 DIAGNOSIS — Z96641 Presence of right artificial hip joint: Secondary | ICD-10-CM | POA: Diagnosis not present

## 2016-12-24 DIAGNOSIS — M5416 Radiculopathy, lumbar region: Secondary | ICD-10-CM | POA: Diagnosis not present

## 2016-12-24 DIAGNOSIS — Z79891 Long term (current) use of opiate analgesic: Secondary | ICD-10-CM | POA: Diagnosis not present

## 2016-12-24 DIAGNOSIS — M47816 Spondylosis without myelopathy or radiculopathy, lumbar region: Secondary | ICD-10-CM | POA: Diagnosis not present

## 2016-12-24 DIAGNOSIS — G8929 Other chronic pain: Secondary | ICD-10-CM | POA: Diagnosis not present

## 2016-12-24 DIAGNOSIS — M25561 Pain in right knee: Secondary | ICD-10-CM | POA: Diagnosis not present

## 2017-01-06 DIAGNOSIS — E785 Hyperlipidemia, unspecified: Secondary | ICD-10-CM | POA: Diagnosis not present

## 2017-01-06 DIAGNOSIS — I2510 Atherosclerotic heart disease of native coronary artery without angina pectoris: Secondary | ICD-10-CM | POA: Diagnosis not present

## 2017-01-06 DIAGNOSIS — I10 Essential (primary) hypertension: Secondary | ICD-10-CM | POA: Diagnosis not present

## 2017-01-06 DIAGNOSIS — I255 Ischemic cardiomyopathy: Secondary | ICD-10-CM | POA: Diagnosis not present

## 2017-01-06 DIAGNOSIS — E876 Hypokalemia: Secondary | ICD-10-CM | POA: Diagnosis not present

## 2017-01-06 DIAGNOSIS — Z95810 Presence of automatic (implantable) cardiac defibrillator: Secondary | ICD-10-CM | POA: Diagnosis not present

## 2017-01-13 DIAGNOSIS — E876 Hypokalemia: Secondary | ICD-10-CM | POA: Diagnosis not present

## 2017-01-13 DIAGNOSIS — Z95810 Presence of automatic (implantable) cardiac defibrillator: Secondary | ICD-10-CM | POA: Diagnosis not present

## 2017-01-13 DIAGNOSIS — I10 Essential (primary) hypertension: Secondary | ICD-10-CM | POA: Diagnosis not present

## 2017-01-13 DIAGNOSIS — I255 Ischemic cardiomyopathy: Secondary | ICD-10-CM | POA: Diagnosis not present

## 2017-01-13 DIAGNOSIS — I2510 Atherosclerotic heart disease of native coronary artery without angina pectoris: Secondary | ICD-10-CM | POA: Diagnosis not present

## 2017-01-13 DIAGNOSIS — E785 Hyperlipidemia, unspecified: Secondary | ICD-10-CM | POA: Diagnosis not present

## 2017-01-14 DIAGNOSIS — M1711 Unilateral primary osteoarthritis, right knee: Secondary | ICD-10-CM | POA: Diagnosis not present

## 2017-01-21 DIAGNOSIS — I429 Cardiomyopathy, unspecified: Secondary | ICD-10-CM | POA: Diagnosis not present

## 2017-01-21 DIAGNOSIS — Z95810 Presence of automatic (implantable) cardiac defibrillator: Secondary | ICD-10-CM | POA: Diagnosis not present

## 2017-01-21 DIAGNOSIS — I2510 Atherosclerotic heart disease of native coronary artery without angina pectoris: Secondary | ICD-10-CM | POA: Diagnosis not present

## 2017-01-21 DIAGNOSIS — I10 Essential (primary) hypertension: Secondary | ICD-10-CM | POA: Diagnosis not present

## 2017-01-21 DIAGNOSIS — I255 Ischemic cardiomyopathy: Secondary | ICD-10-CM | POA: Diagnosis not present

## 2017-01-21 DIAGNOSIS — E785 Hyperlipidemia, unspecified: Secondary | ICD-10-CM | POA: Diagnosis not present

## 2017-01-30 DIAGNOSIS — I2510 Atherosclerotic heart disease of native coronary artery without angina pectoris: Secondary | ICD-10-CM | POA: Diagnosis not present

## 2017-02-10 DIAGNOSIS — G8929 Other chronic pain: Secondary | ICD-10-CM | POA: Diagnosis not present

## 2017-02-10 DIAGNOSIS — M47816 Spondylosis without myelopathy or radiculopathy, lumbar region: Secondary | ICD-10-CM | POA: Diagnosis not present

## 2017-02-10 DIAGNOSIS — M5416 Radiculopathy, lumbar region: Secondary | ICD-10-CM | POA: Diagnosis not present

## 2017-02-20 DIAGNOSIS — A778 Other spotted fevers: Secondary | ICD-10-CM | POA: Diagnosis not present

## 2017-02-20 DIAGNOSIS — W57XXXA Bitten or stung by nonvenomous insect and other nonvenomous arthropods, initial encounter: Secondary | ICD-10-CM | POA: Diagnosis not present

## 2017-02-27 DIAGNOSIS — Z79891 Long term (current) use of opiate analgesic: Secondary | ICD-10-CM | POA: Diagnosis not present

## 2017-02-27 DIAGNOSIS — M47816 Spondylosis without myelopathy or radiculopathy, lumbar region: Secondary | ICD-10-CM | POA: Diagnosis not present

## 2017-02-27 DIAGNOSIS — G8929 Other chronic pain: Secondary | ICD-10-CM | POA: Diagnosis not present

## 2017-03-03 DIAGNOSIS — Z95810 Presence of automatic (implantable) cardiac defibrillator: Secondary | ICD-10-CM | POA: Diagnosis not present

## 2017-03-13 DIAGNOSIS — M1712 Unilateral primary osteoarthritis, left knee: Secondary | ICD-10-CM | POA: Diagnosis not present

## 2017-03-13 DIAGNOSIS — M1711 Unilateral primary osteoarthritis, right knee: Secondary | ICD-10-CM | POA: Diagnosis not present

## 2017-03-28 DIAGNOSIS — M4726 Other spondylosis with radiculopathy, lumbar region: Secondary | ICD-10-CM | POA: Diagnosis not present

## 2017-03-28 DIAGNOSIS — I5022 Chronic systolic (congestive) heart failure: Secondary | ICD-10-CM | POA: Diagnosis not present

## 2017-03-28 DIAGNOSIS — J449 Chronic obstructive pulmonary disease, unspecified: Secondary | ICD-10-CM | POA: Diagnosis not present

## 2017-03-28 DIAGNOSIS — F329 Major depressive disorder, single episode, unspecified: Secondary | ICD-10-CM | POA: Diagnosis not present

## 2017-03-28 DIAGNOSIS — I110 Hypertensive heart disease with heart failure: Secondary | ICD-10-CM | POA: Diagnosis not present

## 2017-03-28 DIAGNOSIS — M8589 Other specified disorders of bone density and structure, multiple sites: Secondary | ICD-10-CM | POA: Diagnosis not present

## 2017-03-28 DIAGNOSIS — I10 Essential (primary) hypertension: Secondary | ICD-10-CM | POA: Diagnosis not present

## 2017-03-28 DIAGNOSIS — E782 Mixed hyperlipidemia: Secondary | ICD-10-CM | POA: Diagnosis not present

## 2017-04-07 DIAGNOSIS — H6983 Other specified disorders of Eustachian tube, bilateral: Secondary | ICD-10-CM | POA: Diagnosis not present

## 2017-04-18 DIAGNOSIS — I80202 Phlebitis and thrombophlebitis of unspecified deep vessels of left lower extremity: Secondary | ICD-10-CM | POA: Diagnosis not present

## 2017-04-18 DIAGNOSIS — M7989 Other specified soft tissue disorders: Secondary | ICD-10-CM | POA: Diagnosis not present

## 2017-04-18 DIAGNOSIS — M1711 Unilateral primary osteoarthritis, right knee: Secondary | ICD-10-CM | POA: Diagnosis not present

## 2017-04-22 DIAGNOSIS — I2510 Atherosclerotic heart disease of native coronary artery without angina pectoris: Secondary | ICD-10-CM | POA: Diagnosis not present

## 2017-04-22 DIAGNOSIS — R32 Unspecified urinary incontinence: Secondary | ICD-10-CM | POA: Diagnosis not present

## 2017-04-22 DIAGNOSIS — J449 Chronic obstructive pulmonary disease, unspecified: Secondary | ICD-10-CM | POA: Diagnosis not present

## 2017-05-20 DIAGNOSIS — M961 Postlaminectomy syndrome, not elsewhere classified: Secondary | ICD-10-CM | POA: Diagnosis not present

## 2017-05-20 DIAGNOSIS — M5136 Other intervertebral disc degeneration, lumbar region: Secondary | ICD-10-CM | POA: Diagnosis not present

## 2017-05-20 DIAGNOSIS — Z720 Tobacco use: Secondary | ICD-10-CM | POA: Diagnosis not present

## 2017-05-20 DIAGNOSIS — M5090 Cervical disc disorder, unspecified, unspecified cervical region: Secondary | ICD-10-CM | POA: Diagnosis not present

## 2017-05-20 DIAGNOSIS — M5412 Radiculopathy, cervical region: Secondary | ICD-10-CM | POA: Diagnosis not present

## 2017-05-20 DIAGNOSIS — G894 Chronic pain syndrome: Secondary | ICD-10-CM | POA: Diagnosis not present

## 2017-05-23 DIAGNOSIS — H6983 Other specified disorders of Eustachian tube, bilateral: Secondary | ICD-10-CM | POA: Diagnosis not present

## 2017-05-23 DIAGNOSIS — H73891 Other specified disorders of tympanic membrane, right ear: Secondary | ICD-10-CM | POA: Diagnosis not present

## 2017-05-26 DIAGNOSIS — M4726 Other spondylosis with radiculopathy, lumbar region: Secondary | ICD-10-CM | POA: Diagnosis not present

## 2017-05-26 DIAGNOSIS — G8929 Other chronic pain: Secondary | ICD-10-CM | POA: Diagnosis not present

## 2017-06-19 DIAGNOSIS — I5022 Chronic systolic (congestive) heart failure: Secondary | ICD-10-CM

## 2017-06-19 DIAGNOSIS — Z95810 Presence of automatic (implantable) cardiac defibrillator: Secondary | ICD-10-CM

## 2017-06-19 DIAGNOSIS — I420 Dilated cardiomyopathy: Secondary | ICD-10-CM

## 2017-06-19 DIAGNOSIS — I5023 Acute on chronic systolic (congestive) heart failure: Secondary | ICD-10-CM | POA: Insufficient documentation

## 2017-06-19 HISTORY — DX: Dilated cardiomyopathy: I42.0

## 2017-06-19 HISTORY — DX: Presence of automatic (implantable) cardiac defibrillator: Z95.810

## 2017-06-19 HISTORY — DX: Chronic systolic (congestive) heart failure: I50.22

## 2017-06-24 DIAGNOSIS — Z79891 Long term (current) use of opiate analgesic: Secondary | ICD-10-CM | POA: Diagnosis not present

## 2017-06-24 DIAGNOSIS — J441 Chronic obstructive pulmonary disease with (acute) exacerbation: Secondary | ICD-10-CM | POA: Diagnosis not present

## 2017-06-25 ENCOUNTER — Ambulatory Visit: Payer: Medicare Other | Admitting: Cardiology

## 2017-06-30 ENCOUNTER — Encounter: Payer: Self-pay | Admitting: Cardiology

## 2017-06-30 ENCOUNTER — Other Ambulatory Visit: Payer: Self-pay | Admitting: Cardiology

## 2017-06-30 ENCOUNTER — Ambulatory Visit (INDEPENDENT_AMBULATORY_CARE_PROVIDER_SITE_OTHER): Payer: Medicare Other | Admitting: Cardiology

## 2017-06-30 VITALS — BP 110/78 | HR 76 | Resp 12 | Ht 60.0 in | Wt 121.0 lb

## 2017-06-30 DIAGNOSIS — I5022 Chronic systolic (congestive) heart failure: Secondary | ICD-10-CM | POA: Diagnosis not present

## 2017-06-30 DIAGNOSIS — I10 Essential (primary) hypertension: Secondary | ICD-10-CM

## 2017-06-30 DIAGNOSIS — I2510 Atherosclerotic heart disease of native coronary artery without angina pectoris: Secondary | ICD-10-CM

## 2017-06-30 DIAGNOSIS — I420 Dilated cardiomyopathy: Secondary | ICD-10-CM

## 2017-06-30 NOTE — Patient Instructions (Signed)
Medication Instructions:  Your physician recommends that you continue on your current medications as directed. Please refer to the Current Medication list given to you today.  Labwork: Your physician recommends that you have lab work today to check your cholesterol, fluid congestion level and Kidney function as well as sodium level and potassium.  Testing/Procedures: EKG today in office.   Your physician has requested that you have an echocardiogram. Echocardiography is a painless test that uses sound waves to create images of your heart. It provides your doctor with information about the size and shape of your heart and how well your heart's chambers and valves are working. This procedure takes approximately one hour. There are no restrictions for this procedure.   Follow-Up: Your physician wants you to follow-up in: 6 months.  You will receive a reminder letter in the mail two months in advance. If you don't receive a letter, please call our office to schedule the follow-up appointment.  Any Other Special Instructions Will Be Listed Below (If Applicable).  Please note that any paperwork needing to be filled out by the provider will need to be addressed at the front desk prior to seeing the provider. Please note that any paperwork FMLA, Disability or other documents regarding health condition is subject to a $25.00 charge that must be received prior to completion of paperwork in the form of a money order or check.     If you need a refill on your cardiac medications before your next appointment, please call your pharmacy.

## 2017-06-30 NOTE — Progress Notes (Signed)
Cardiology Office Note:    Date:  06/30/2017   ID:  [name], DOB April 15, 1956, MRN [id number]  PCP:  Lillard Anes, MD  Cardiologist:  Jenne Campus, MD    Referring MD: Lillard Anes,*   Chief Complaint  Patient presents with  . Follow-up  I am doing well  History of Present Illness:    [name] is a 61 y.o. female with multiple medical issues.  She did have history of cardiomyopathy with improvement described to have some swelling of lower extremities at evening time and sometimes she will shortness of breath and fatigue while walking syncope no passing out.  She is on small dose of diuretic which seems to be helping.  Described to have difficulty sleeping which is related to chronic narcotic use for back pain.  Recently dose of narcotics was decreased and she has difficulty sleeping and difficulty dealing with the pain.  Past Medical History:  Diagnosis Date  . AICD (automatic cardioverter/defibrillator) present 2012   . Arthritis   . Arthritis   . Asthma   . CHF (congestive heart failure) ([place])   . Chronic back pain   . COPD (chronic obstructive pulmonary disease) ([place])   . GERD (gastroesophageal reflux disease)   . Heart failure   . HLD (hyperlipidemia)   . Hypertension   . ICD (implantable cardiac defibrillator) in place 2012  . Left ventricular systolic dysfunction   . Myocardial infarction [facility])    2011 during intestinal blockage surgery  . NICM (nonischemic cardiomyopathy) ([place])   . PTSD (post-traumatic stress disorder)   . PVCs (premature ventricular contractions)   . Thyroid disease     Past Surgical History:  Procedure Laterality Date  . APPENDECTOMY    . CARDIAC CATHETERIZATION    . CARPAL TUNNEL RELEASE    . CESAREAN SECTION    . CHOLECYSTECTOMY    . CORONARY ANGIOPLASTY    . HAND SURGERY    . ICD IMPLANT     Medtronic  . intestinal blockage 2011    . NECK SURGERY     fused  . TONSILLECTOMY      Current  Medications: Current Meds  Medication Sig  . albuterol-ipratropium (COMBIVENT) 18-103 MCG/ACT inhaler Inhale 2 puffs into the lungs every 6 (six) hours as needed. For shortness of breath  . aspirin 81 MG tablet Take 81 mg by mouth daily.    . diazepam (VALIUM) 5 MG tablet Take 1 tablet by mouth every 6 (six) hours as needed for anxiety.   Marland Kitchen esomeprazole (NEXIUM) 20 MG capsule Take 20 mg by mouth daily at 12 noon.  . fentaNYL (DURAGESIC - DOSED MCG/HR) 75 MCG/HR Place 1 patch onto the skin every other day.   . fluticasone furoate-vilanterol (BREO ELLIPTA) 100-25 MCG/INH AEPB Inhale 1 puff into the lungs daily.  . furosemide (LASIX) 20 MG tablet Take 20 mg by mouth daily.  Marland Kitchen HYDROcodone-acetaminophen (NORCO) 10-325 MG per tablet Take 1-2 tablets by mouth every 4 (four) hours as needed for moderate pain or severe pain. (Patient taking differently: Take 1 tablet by mouth every 6 (six) hours as needed for moderate pain or severe pain. )  . ipratropium (ATROVENT) 0.02 % nebulizer solution Take 0.5 mg by nebulization every 4 (four) hours as needed for wheezing or shortness of breath.  . linaclotide (LINZESS) 145 MCG CAPS capsule Take 1 capsule by mouth daily.  Marland Kitchen lisinopril (PRINIVIL,ZESTRIL) 10 MG tablet Take 10 mg by mouth daily.  . nitroGLYCERIN (  NITROSTAT) 0.4 MG SL tablet Place 0.4 mg under the tongue every 5 (five) minutes as needed for chest pain.  . potassium chloride (MICRO-K) 10 MEQ CR capsule Take 10 mEq by mouth 2 (two) times daily.  . ranolazine (RANEXA) 500 MG 12 hr tablet Take 500 mg by mouth 2 (two) times daily.  . rosuvastatin (CRESTOR) 10 MG tablet Take 10 mg by mouth daily.     Allergies:   Patient has no known allergies.   Social History   Socioeconomic History  . Marital status: Single    Spouse name: None  . Number of children: 3  . Years of education: None  . Highest education level: None  Social Needs  . Financial resource strain: None  . Food insecurity - worry: None    . Food insecurity - inability: None  . Transportation needs - medical: None  . Transportation needs - non-medical: None  Occupational History  . Occupation: disabled  Tobacco Use  . Smoking status: Former Smoker    Packs/day: 1.00    Years: 30.00    Pack years: 30.00    Types: Cigarettes    Last attempt to quit: 08/11/2010    Years since quitting: 6.8  . Smokeless tobacco: Never Used  Substance and Sexual Activity  . Alcohol use: No  . Drug use: No  . Sexual activity: Not Currently  Other Topics Concern  . None  Social History Narrative  . None     Family History: The patient's family history includes Alcohol abuse in her father; Cancer in her unknown relative; Heart attack in her father, mother, and unknown relative; Heart failure in her unknown relative; Hypertension in her father and mother. There is no history of Anesthesia problems, Hypotension, Malignant hyperthermia, or Pseudochol deficiency. ROS:   Please see the history of present illness.    All 14 point review of systems negative except as described per history of present illness  EKGs/Labs/Other Studies Reviewed:      Recent Labs: No results found for requested labs within last 8760 hours.  Recent Lipid Panel No results found for: CHOL, TRIG, HDL, CHOLHDL, VLDL, LDLCALC, LDLDIRECT  Physical Exam:    VS:  BP 110/78   Pulse 76   Resp 12   Ht 5' (1.524 m)   Wt 121 lb (54.9 kg)   BMI 23.63 kg/m     Wt Readings from Last 3 Encounters:  06/30/17 121 lb (54.9 kg)  03/26/15 135 lb (61.2 kg)  03/24/15 138 lb 14.2 oz (63 kg)     GEN:  Well nourished, well developed in no acute distress HEENT: Normal NECK: No JVD; No carotid bruits LYMPHATICS: No lymphadenopathy CARDIAC: RRR, no murmurs, no rubs, no gallops RESPIRATORY:  Clear to auscultation without rales, wheezing or rhonchi  ABDOMEN: Soft, non-tender, non-distended MUSCULOSKELETAL:  No edema; No deformity  SKIN: Warm and dry LOWER EXTREMITIES: no  swelling NEUROLOGIC:  Alert and oriented x 3 PSYCHIATRIC:  Normal affect   ASSESSMENT:    1. Dilated cardiomyopathy ([place])   2. Chronic systolic congestive heart failure, NYHA class 2 ([place])   3. Coronary artery disease involving native coronary artery of native heart without angina pectoris   4. Essential hypertension    PLAN:    In order of problems listed above:  1. Dilated cardiomyopathy: We will ask her to have an echocardiogram to assess left ventricular ejection fraction 2. Chronic systolic congestive heart failure: Compensated on appropriate medication which include beta-blocker and ACE inhibitor.  3. Artery disease: She did have abnormal stress test but cardiac catheterization after that showed normal coronaries denies having any typical symptoms. 4. Essential high blood pressure: Blood pressure appears to be well controlled we will continue present management.   Medication Adjustments/Labs and Tests Ordered: Current medicines are reviewed at length with the patient today.  Concerns regarding medicines are outlined above.  No orders of the defined types were placed in this encounter.  Medication changes: No orders of the defined types were placed in this encounter.   Signed, Park Liter, MD, [facility] 06/30/2017 10:49 AM    [place]

## 2017-07-01 LAB — BASIC METABOLIC PANEL
BUN/Creatinine Ratio: 26 (ref 12–28)
BUN: 26 mg/dL (ref 8–27)
CO2: 24 mmol/L (ref 20–29)
Calcium: 10.3 mg/dL (ref 8.7–10.3)
Chloride: 101 mmol/L (ref 96–106)
Creatinine, Ser: 0.99 mg/dL (ref 0.57–1.00)
GFR calc Af Amer: 71 mL/min/{1.73_m2} (ref >59)
GFR calc non Af Amer: 61 mL/min/{1.73_m2} (ref >59)
Glucose: 88 mg/dL (ref 65–99)
Potassium: 4.6 mmol/L (ref 3.5–5.2)
Sodium: 140 mmol/L (ref 134–144)

## 2017-07-01 LAB — LIPID PANEL
Chol/HDL Ratio: 2.7 ratio (ref 0.0–4.4)
Cholesterol, Total: 170 mg/dL (ref 100–199)
HDL: 64 mg/dL (ref >39)
LDL Calculated: 74 mg/dL (ref 0–99)
Triglycerides: 160 mg/dL — ABNORMAL HIGH (ref 0–149)
VLDL Cholesterol Cal: 32 mg/dL (ref 5–40)

## 2017-07-01 LAB — PRO B NATRIURETIC PEPTIDE: NT-Pro BNP: 549 pg/mL — ABNORMAL HIGH (ref 0–287)

## 2017-07-21 DIAGNOSIS — Z23 Encounter for immunization: Secondary | ICD-10-CM | POA: Diagnosis not present

## 2017-07-22 ENCOUNTER — Ambulatory Visit (HOSPITAL_BASED_OUTPATIENT_CLINIC_OR_DEPARTMENT_OTHER)
Admission: RE | Admit: 2017-07-22 | Discharge: 2017-07-22 | Disposition: A | Discharge status: Home / Self Care | Payer: Medicare Other | Source: Ambulatory Visit | Attending: Cardiology | Admitting: Cardiology

## 2017-07-22 DIAGNOSIS — I5022 Chronic systolic (congestive) heart failure: Secondary | ICD-10-CM | POA: Diagnosis not present

## 2017-07-22 DIAGNOSIS — E785 Hyperlipidemia, unspecified: Secondary | ICD-10-CM | POA: Insufficient documentation

## 2017-07-22 DIAGNOSIS — I420 Dilated cardiomyopathy: Secondary | ICD-10-CM | POA: Diagnosis not present

## 2017-07-22 DIAGNOSIS — J449 Chronic obstructive pulmonary disease, unspecified: Secondary | ICD-10-CM | POA: Diagnosis not present

## 2017-07-22 DIAGNOSIS — I2510 Atherosclerotic heart disease of native coronary artery without angina pectoris: Secondary | ICD-10-CM | POA: Insufficient documentation

## 2017-07-22 DIAGNOSIS — I340 Nonrheumatic mitral (valve) insufficiency: Secondary | ICD-10-CM | POA: Insufficient documentation

## 2017-07-22 NOTE — Progress Notes (Signed)
Echocardiogram 2D Echocardiogram has been performed.  [name] 07/22/2017, 12:19 PM

## 2017-07-24 DIAGNOSIS — M1711 Unilateral primary osteoarthritis, right knee: Secondary | ICD-10-CM | POA: Diagnosis not present

## 2017-07-29 DIAGNOSIS — I2510 Atherosclerotic heart disease of native coronary artery without angina pectoris: Secondary | ICD-10-CM | POA: Diagnosis not present

## 2017-07-29 DIAGNOSIS — Z79891 Long term (current) use of opiate analgesic: Secondary | ICD-10-CM | POA: Diagnosis not present

## 2017-07-29 DIAGNOSIS — J449 Chronic obstructive pulmonary disease, unspecified: Secondary | ICD-10-CM | POA: Diagnosis not present

## 2017-07-29 DIAGNOSIS — F329 Major depressive disorder, single episode, unspecified: Secondary | ICD-10-CM | POA: Diagnosis not present

## 2017-07-29 DIAGNOSIS — I10 Essential (primary) hypertension: Secondary | ICD-10-CM | POA: Diagnosis not present

## 2017-07-29 DIAGNOSIS — E782 Mixed hyperlipidemia: Secondary | ICD-10-CM | POA: Diagnosis not present

## 2017-07-29 DIAGNOSIS — M8589 Other specified disorders of bone density and structure, multiple sites: Secondary | ICD-10-CM | POA: Diagnosis not present

## 2017-07-29 DIAGNOSIS — M4726 Other spondylosis with radiculopathy, lumbar region: Secondary | ICD-10-CM | POA: Diagnosis not present

## 2017-07-29 DIAGNOSIS — I5022 Chronic systolic (congestive) heart failure: Secondary | ICD-10-CM | POA: Diagnosis not present

## 2017-08-13 DIAGNOSIS — M5136 Other intervertebral disc degeneration, lumbar region: Secondary | ICD-10-CM | POA: Diagnosis not present

## 2017-08-13 DIAGNOSIS — D539 Nutritional anemia, unspecified: Secondary | ICD-10-CM | POA: Diagnosis not present

## 2017-08-13 DIAGNOSIS — R5383 Other fatigue: Secondary | ICD-10-CM | POA: Diagnosis not present

## 2017-08-13 DIAGNOSIS — M25561 Pain in right knee: Secondary | ICD-10-CM | POA: Diagnosis not present

## 2017-08-13 DIAGNOSIS — M542 Cervicalgia: Secondary | ICD-10-CM | POA: Diagnosis not present

## 2017-08-13 DIAGNOSIS — M129 Arthropathy, unspecified: Secondary | ICD-10-CM | POA: Diagnosis not present

## 2017-08-13 DIAGNOSIS — E559 Vitamin D deficiency, unspecified: Secondary | ICD-10-CM | POA: Diagnosis not present

## 2017-08-13 DIAGNOSIS — Z79899 Other long term (current) drug therapy: Secondary | ICD-10-CM | POA: Diagnosis not present

## 2017-08-13 DIAGNOSIS — E7800 Pure hypercholesterolemia, unspecified: Secondary | ICD-10-CM | POA: Diagnosis not present

## 2017-08-13 DIAGNOSIS — M810 Age-related osteoporosis without current pathological fracture: Secondary | ICD-10-CM | POA: Diagnosis not present

## 2017-08-13 DIAGNOSIS — M545 Low back pain: Secondary | ICD-10-CM | POA: Diagnosis not present

## 2017-08-15 DIAGNOSIS — M545 Low back pain: Secondary | ICD-10-CM | POA: Diagnosis not present

## 2017-08-15 DIAGNOSIS — M5136 Other intervertebral disc degeneration, lumbar region: Secondary | ICD-10-CM | POA: Diagnosis not present

## 2017-08-20 DIAGNOSIS — G8929 Other chronic pain: Secondary | ICD-10-CM | POA: Diagnosis not present

## 2017-08-20 DIAGNOSIS — M5030 Other cervical disc degeneration, unspecified cervical region: Secondary | ICD-10-CM | POA: Diagnosis not present

## 2017-08-20 DIAGNOSIS — M545 Low back pain: Secondary | ICD-10-CM | POA: Diagnosis not present

## 2017-08-20 DIAGNOSIS — M5136 Other intervertebral disc degeneration, lumbar region: Secondary | ICD-10-CM | POA: Diagnosis not present

## 2017-08-20 DIAGNOSIS — Z79899 Other long term (current) drug therapy: Secondary | ICD-10-CM | POA: Diagnosis not present

## 2017-08-21 ENCOUNTER — Telehealth: Payer: Self-pay | Admitting: Cardiology

## 2017-08-21 NOTE — Telephone Encounter (Signed)
Patient states another doctor prescribed Celecoxid 200mg  for her and a side effect is heart attack and stroke. She is wondering if she should take this medicine. 04/30/1956

## 2017-08-25 NOTE — Telephone Encounter (Signed)
Well, risk and benefits, from my point of view she should not take it

## 2017-08-25 NOTE — Telephone Encounter (Signed)
Patient was informed. She states that she will not be taking the medication.

## 2017-08-28 DIAGNOSIS — G8929 Other chronic pain: Secondary | ICD-10-CM | POA: Diagnosis not present

## 2017-08-28 DIAGNOSIS — D72829 Elevated white blood cell count, unspecified: Secondary | ICD-10-CM | POA: Diagnosis not present

## 2017-08-28 DIAGNOSIS — M4726 Other spondylosis with radiculopathy, lumbar region: Secondary | ICD-10-CM | POA: Diagnosis not present

## 2017-08-28 DIAGNOSIS — I5022 Chronic systolic (congestive) heart failure: Secondary | ICD-10-CM | POA: Diagnosis not present

## 2017-08-29 DIAGNOSIS — D72829 Elevated white blood cell count, unspecified: Secondary | ICD-10-CM | POA: Diagnosis not present

## 2017-09-01 DIAGNOSIS — H938X1 Other specified disorders of right ear: Secondary | ICD-10-CM | POA: Diagnosis not present

## 2017-09-01 DIAGNOSIS — Z9622 Myringotomy tube(s) status: Secondary | ICD-10-CM | POA: Diagnosis not present

## 2017-09-01 DIAGNOSIS — H6980 Other specified disorders of Eustachian tube, unspecified ear: Secondary | ICD-10-CM | POA: Diagnosis not present

## 2017-09-05 DIAGNOSIS — Z809 Family history of malignant neoplasm, unspecified: Secondary | ICD-10-CM | POA: Diagnosis not present

## 2017-09-05 DIAGNOSIS — Z800 Family history of malignant neoplasm of digestive organs: Secondary | ICD-10-CM | POA: Diagnosis not present

## 2017-09-05 DIAGNOSIS — C189 Malignant neoplasm of colon, unspecified: Secondary | ICD-10-CM | POA: Diagnosis not present

## 2017-09-05 DIAGNOSIS — C801 Malignant (primary) neoplasm, unspecified: Secondary | ICD-10-CM | POA: Diagnosis not present

## 2017-09-19 DIAGNOSIS — M419 Scoliosis, unspecified: Secondary | ICD-10-CM | POA: Diagnosis not present

## 2017-09-19 DIAGNOSIS — G8929 Other chronic pain: Secondary | ICD-10-CM | POA: Diagnosis not present

## 2017-09-19 DIAGNOSIS — M5136 Other intervertebral disc degeneration, lumbar region: Secondary | ICD-10-CM | POA: Diagnosis not present

## 2017-09-19 DIAGNOSIS — M545 Low back pain: Secondary | ICD-10-CM | POA: Diagnosis not present

## 2017-09-26 DIAGNOSIS — I5020 Unspecified systolic (congestive) heart failure: Secondary | ICD-10-CM | POA: Diagnosis not present

## 2017-10-02 DIAGNOSIS — Z79891 Long term (current) use of opiate analgesic: Secondary | ICD-10-CM | POA: Diagnosis not present

## 2017-10-02 DIAGNOSIS — J0100 Acute maxillary sinusitis, unspecified: Secondary | ICD-10-CM | POA: Diagnosis not present

## 2017-10-03 DIAGNOSIS — G8929 Other chronic pain: Secondary | ICD-10-CM | POA: Diagnosis not present

## 2017-10-03 DIAGNOSIS — Z79899 Other long term (current) drug therapy: Secondary | ICD-10-CM | POA: Diagnosis not present

## 2017-10-03 DIAGNOSIS — M545 Low back pain: Secondary | ICD-10-CM | POA: Diagnosis not present

## 2017-10-03 DIAGNOSIS — M5136 Other intervertebral disc degeneration, lumbar region: Secondary | ICD-10-CM | POA: Diagnosis not present

## 2017-10-24 DIAGNOSIS — M25561 Pain in right knee: Secondary | ICD-10-CM | POA: Diagnosis not present

## 2017-10-24 DIAGNOSIS — I5020 Unspecified systolic (congestive) heart failure: Secondary | ICD-10-CM | POA: Diagnosis not present

## 2017-10-31 DIAGNOSIS — M419 Scoliosis, unspecified: Secondary | ICD-10-CM | POA: Diagnosis not present

## 2017-10-31 DIAGNOSIS — Z79899 Other long term (current) drug therapy: Secondary | ICD-10-CM | POA: Diagnosis not present

## 2017-10-31 DIAGNOSIS — M5136 Other intervertebral disc degeneration, lumbar region: Secondary | ICD-10-CM | POA: Diagnosis not present

## 2017-10-31 DIAGNOSIS — M545 Low back pain: Secondary | ICD-10-CM | POA: Diagnosis not present

## 2017-10-31 DIAGNOSIS — G8929 Other chronic pain: Secondary | ICD-10-CM | POA: Diagnosis not present

## 2017-11-18 DIAGNOSIS — M25561 Pain in right knee: Secondary | ICD-10-CM | POA: Diagnosis not present

## 2017-11-18 DIAGNOSIS — M25861 Other specified joint disorders, right knee: Secondary | ICD-10-CM | POA: Diagnosis not present

## 2017-11-18 DIAGNOSIS — M7121 Synovial cyst of popliteal space [Baker], right knee: Secondary | ICD-10-CM | POA: Diagnosis not present

## 2017-11-18 DIAGNOSIS — M1711 Unilateral primary osteoarthritis, right knee: Secondary | ICD-10-CM | POA: Diagnosis not present

## 2017-11-19 DIAGNOSIS — M1711 Unilateral primary osteoarthritis, right knee: Secondary | ICD-10-CM | POA: Diagnosis not present

## 2017-11-19 DIAGNOSIS — M25561 Pain in right knee: Secondary | ICD-10-CM | POA: Diagnosis not present

## 2017-11-24 DIAGNOSIS — I5020 Unspecified systolic (congestive) heart failure: Secondary | ICD-10-CM | POA: Diagnosis not present

## 2017-11-27 DIAGNOSIS — E782 Mixed hyperlipidemia: Secondary | ICD-10-CM | POA: Diagnosis not present

## 2017-11-27 DIAGNOSIS — D72829 Elevated white blood cell count, unspecified: Secondary | ICD-10-CM | POA: Diagnosis not present

## 2017-11-27 DIAGNOSIS — I10 Essential (primary) hypertension: Secondary | ICD-10-CM | POA: Diagnosis not present

## 2017-11-27 DIAGNOSIS — I5022 Chronic systolic (congestive) heart failure: Secondary | ICD-10-CM | POA: Diagnosis not present

## 2017-11-27 DIAGNOSIS — G8929 Other chronic pain: Secondary | ICD-10-CM | POA: Diagnosis not present

## 2017-11-27 DIAGNOSIS — J449 Chronic obstructive pulmonary disease, unspecified: Secondary | ICD-10-CM | POA: Diagnosis not present

## 2017-11-27 DIAGNOSIS — M4726 Other spondylosis with radiculopathy, lumbar region: Secondary | ICD-10-CM | POA: Diagnosis not present

## 2017-11-28 ENCOUNTER — Telehealth: Payer: Self-pay | Admitting: *Deleted

## 2017-11-28 NOTE — Telephone Encounter (Signed)
Pt phoned to get okay from Dr. Agustin Cree about medication pain clinic R[facility]. Medication is CELECOXIB 200 mg to be taken once daily. Please call and let her know if this is okay to take.

## 2017-11-28 NOTE — Telephone Encounter (Signed)
Discussed with Dr. Agustin Cree, and advised patient that she was not a good candidate to take Celebrex, but did let her know that Dr. Agustin Cree advised her that if she would like to try it for 2-3 day that would be okay. The patient was understanding and decided against trying the medication.

## 2017-12-01 DIAGNOSIS — Z79899 Other long term (current) drug therapy: Secondary | ICD-10-CM | POA: Diagnosis not present

## 2017-12-01 DIAGNOSIS — M5136 Other intervertebral disc degeneration, lumbar region: Secondary | ICD-10-CM | POA: Diagnosis not present

## 2017-12-01 DIAGNOSIS — M419 Scoliosis, unspecified: Secondary | ICD-10-CM | POA: Diagnosis not present

## 2017-12-01 DIAGNOSIS — M545 Low back pain: Secondary | ICD-10-CM | POA: Diagnosis not present

## 2017-12-01 DIAGNOSIS — Z9622 Myringotomy tube(s) status: Secondary | ICD-10-CM | POA: Diagnosis not present

## 2017-12-01 DIAGNOSIS — H938X1 Other specified disorders of right ear: Secondary | ICD-10-CM | POA: Diagnosis not present

## 2017-12-01 DIAGNOSIS — H6981 Other specified disorders of Eustachian tube, right ear: Secondary | ICD-10-CM | POA: Diagnosis not present

## 2017-12-01 DIAGNOSIS — G8929 Other chronic pain: Secondary | ICD-10-CM | POA: Diagnosis not present

## 2017-12-18 DIAGNOSIS — E875 Hyperkalemia: Secondary | ICD-10-CM | POA: Diagnosis not present

## 2017-12-26 DIAGNOSIS — I5020 Unspecified systolic (congestive) heart failure: Secondary | ICD-10-CM | POA: Diagnosis not present

## 2017-12-26 DIAGNOSIS — M5136 Other intervertebral disc degeneration, lumbar region: Secondary | ICD-10-CM | POA: Diagnosis not present

## 2017-12-26 DIAGNOSIS — G8929 Other chronic pain: Secondary | ICD-10-CM | POA: Diagnosis not present

## 2017-12-26 DIAGNOSIS — M546 Pain in thoracic spine: Secondary | ICD-10-CM | POA: Diagnosis not present

## 2017-12-26 DIAGNOSIS — Z79899 Other long term (current) drug therapy: Secondary | ICD-10-CM | POA: Diagnosis not present

## 2017-12-26 DIAGNOSIS — M545 Low back pain: Secondary | ICD-10-CM | POA: Diagnosis not present

## 2017-12-30 ENCOUNTER — Ambulatory Visit: Payer: Medicare Other | Admitting: Cardiology

## 2018-01-12 DIAGNOSIS — Z95810 Presence of automatic (implantable) cardiac defibrillator: Secondary | ICD-10-CM | POA: Diagnosis not present

## 2018-01-15 ENCOUNTER — Ambulatory Visit: Payer: Medicare Other | Admitting: Cardiology

## 2018-01-23 ENCOUNTER — Ambulatory Visit: Payer: Medicare Other | Admitting: Cardiology

## 2018-01-25 DIAGNOSIS — Z79899 Other long term (current) drug therapy: Secondary | ICD-10-CM | POA: Diagnosis not present

## 2018-01-25 DIAGNOSIS — M546 Pain in thoracic spine: Secondary | ICD-10-CM | POA: Diagnosis not present

## 2018-01-25 DIAGNOSIS — M419 Scoliosis, unspecified: Secondary | ICD-10-CM | POA: Diagnosis not present

## 2018-01-25 DIAGNOSIS — G8929 Other chronic pain: Secondary | ICD-10-CM | POA: Diagnosis not present

## 2018-01-25 DIAGNOSIS — M545 Low back pain: Secondary | ICD-10-CM | POA: Diagnosis not present

## 2018-01-27 DIAGNOSIS — I5020 Unspecified systolic (congestive) heart failure: Secondary | ICD-10-CM | POA: Diagnosis not present

## 2018-01-28 ENCOUNTER — Ambulatory Visit (INDEPENDENT_AMBULATORY_CARE_PROVIDER_SITE_OTHER): Payer: Medicare Other | Admitting: Cardiology

## 2018-01-28 ENCOUNTER — Encounter: Payer: Self-pay | Admitting: Cardiology

## 2018-01-28 VITALS — BP 118/68 | HR 77 | Ht 60.0 in | Wt 131.0 lb

## 2018-01-28 DIAGNOSIS — I10 Essential (primary) hypertension: Secondary | ICD-10-CM

## 2018-01-28 DIAGNOSIS — Z95810 Presence of automatic (implantable) cardiac defibrillator: Secondary | ICD-10-CM | POA: Diagnosis not present

## 2018-01-28 DIAGNOSIS — E785 Hyperlipidemia, unspecified: Secondary | ICD-10-CM | POA: Diagnosis not present

## 2018-01-28 DIAGNOSIS — I420 Dilated cardiomyopathy: Secondary | ICD-10-CM | POA: Diagnosis not present

## 2018-01-28 DIAGNOSIS — I2510 Atherosclerotic heart disease of native coronary artery without angina pectoris: Secondary | ICD-10-CM | POA: Diagnosis not present

## 2018-01-28 MED ORDER — CARVEDILOL 3.125 MG PO TABS: 3.1250 mg | ORAL_TABLET | Freq: Two times a day (BID) | ORAL | 3 refills | Status: DC

## 2018-01-28 MED ORDER — SACUBITRIL-VALSARTAN 24-26 MG PO TABS: 1.0000 | ORAL_TABLET | Freq: Two times a day (BID) | ORAL | 6 refills | Status: DC

## 2018-01-28 NOTE — Patient Instructions (Signed)
Medication Instructions:  Your physician has recommended you make the following change in your medication: STOP lisinopril START Entresto 24-26 mg twice daily AFTER you have stopped lisinopril for a full 48 hours: samples provided.  CONTINUE carvedilol (coreg) 3.125 mg twice daily  Labwork: Your physician recommends that you return for lab work in 1 week: BMP. Please have your labs completed at [facility] in [place]. Hours of operation are Monday through Friday; 8:00 am to 4:30. Address is [address], [place], [place] [address]. Phone number is [phone]. You do not need an appointment for lab work to be completed.  Testing/Procedures: None  Follow-Up: Your physician recommends that you schedule a follow-up appointment in: 1 month.  If you need a refill on your cardiac medications before your next appointment, please call your pharmacy.   Thank you for choosing [facility]! Robyne Peers, RN [phone]

## 2018-01-28 NOTE — Progress Notes (Signed)
Cardiology Office Note:    Date:  01/28/2018   ID:  [name], DOB 1956/03/11, MRN [id number]  PCP:  Lillard Anes, MD  Cardiologist:  Jenne Campus, MD    Referring MD: Lillard Anes,*   No chief complaint on file. Doing well  History of Present Illness:    [name] is a 62 y.o. female with history of nonischemic cardiomyopathy with fluctuated ejection fraction.  Latest echocardiogram showed ejection fraction 30 to 35% in November.  We will try to make some adjustment to the medication however she did not want to do it we spent Gradle of time talking today about this I strongly recommend to switch from lisinopril to [place].  She agree I will give her a small dose of Entresto twice daily and I stressed importance of stopping lisinopril for 2 days before taking Entresto.  She is already on carvedilol, however it is not documented in our medication list.  We will try to contact her pharmacy to see make sure that she takes carvedilol.  I will continue with present dose of Entresto I will check her kidney function about a week and I will see her back in my office in about a month another issue is her ICD which showed [place] lunge activity of less than 1 year I will invite pacemaker rep to see if I can interrogate her device.  Past Medical History:  Diagnosis Date  . AICD (automatic cardioverter/defibrillator) present 2012   . Arthritis   . Arthritis   . Asthma   . CHF (congestive heart failure) ([place])   . Chronic back pain   . COPD (chronic obstructive pulmonary disease) ([place])   . GERD (gastroesophageal reflux disease)   . Heart failure   . HLD (hyperlipidemia)   . Hypertension   . ICD (implantable cardiac defibrillator) in place 2012  . Left ventricular systolic dysfunction   . Myocardial infarction [facility])    2011 during intestinal blockage surgery  . NICM (nonischemic cardiomyopathy) ([place])   . PTSD (post-traumatic stress disorder)   . PVCs (premature  ventricular contractions)   . Thyroid disease     Past Surgical History:  Procedure Laterality Date  . APPENDECTOMY    . CARDIAC CATHETERIZATION    . CARPAL TUNNEL RELEASE    . CESAREAN SECTION    . CHOLECYSTECTOMY    . CORONARY ANGIOPLASTY    . HAND SURGERY    . ICD IMPLANT     Medtronic  . intestinal blockage 2011    . LAPAROSCOPIC ASSISTED VENTRAL HERNIA REPAIR N/A 03/23/2015   Procedure: LAPAROSCOPIC VENTRAL WALL HERNIA REPAIR;  Surgeon: Michael Boston, MD;  Location: [facility];  Service: General;  Laterality: N/A;  With MESH  . LAPAROSCOPIC LYSIS OF ADHESIONS N/A 03/23/2015   Procedure: LAPAROSCOPIC LYSIS OF ADHESIONS;  Surgeon: Michael Boston, MD;  Location: [facility]S;  Service: General;  Laterality: N/A;  . NECK SURGERY     fused  . TONSILLECTOMY    . ULNAR NERVE TRANSPOSITION  01/23/2012   Procedure: ULNAR NERVE DECOMPRESSION/TRANSPOSITION;  Surgeon: Ophelia Charter, MD;  Location: [facility] OR[facility];  Service: Neurosurgery;  Laterality: Left;  LEFT ulnar nerve decompression    Current Medications: Current Meds  Medication Sig  . albuterol-ipratropium (COMBIVENT) 18-103 MCG/ACT inhaler Inhale 2 puffs into the lungs every 6 (six) hours as needed. For shortness of breath  . aspirin 81 MG tablet Take 81 mg by mouth daily.    Marland Kitchen BELBUCA 300 MCG  FILM Place 1 patch under the tongue every 12 (twelve) hours.  . celecoxib (CELEBREX) 200 MG capsule Take 1 capsule by mouth daily.  . diazepam (VALIUM) 5 MG tablet Take 1 tablet by mouth every 6 (six) hours as needed for anxiety.   Marland Kitchen esomeprazole (NEXIUM) 20 MG capsule Take 20 mg by mouth daily at 12 noon.  . fluticasone furoate-vilanterol (BREO ELLIPTA) 100-25 MCG/INH AEPB Inhale 1 puff into the lungs daily.  . furosemide (LASIX) 20 MG tablet Take 20 mg by mouth daily.  Marland Kitchen gabapentin (NEURONTIN) 300 MG capsule Take 1 capsule by mouth 3 (three) times daily.  Marland Kitchen HYDROcodone-acetaminophen (NORCO) 10-325 MG per tablet Take 1-2 tablets by mouth every 4  (four) hours as needed for moderate pain or severe pain. (Patient taking differently: Take 1 tablet by mouth every 6 (six) hours as needed for moderate pain or severe pain. )  . ipratropium (ATROVENT) 0.02 % nebulizer solution Take 0.5 mg by nebulization every 4 (four) hours as needed for wheezing or shortness of breath.  . linaclotide (LINZESS) 145 MCG CAPS capsule Take 1 capsule by mouth daily.  Marland Kitchen lisinopril (PRINIVIL,ZESTRIL) 10 MG tablet Take 10 mg by mouth daily.  . nitroGLYCERIN (NITROSTAT) 0.4 MG SL tablet Place 0.4 mg under the tongue every 5 (five) minutes as needed for chest pain.  . potassium chloride (MICRO-K) 10 MEQ CR capsule Take 10 mEq by mouth 2 (two) times daily.  . ranolazine (RANEXA) 500 MG 12 hr tablet Take 500 mg by mouth 2 (two) times daily.  . rosuvastatin (CRESTOR) 10 MG tablet Take 10 mg by mouth daily.     Allergies:   Patient has no known allergies.   Social History   Socioeconomic History  . Marital status: Single    Spouse name: Not on file  . Number of children: 3  . Years of education: Not on file  . Highest education level: Not on file  Occupational History  . Occupation: disabled  Social Needs  . Financial resource strain: Not on file  . Food insecurity:    Worry: Not on file    Inability: Not on file  . Transportation needs:    Medical: Not on file    Non-medical: Not on file  Tobacco Use  . Smoking status: Former Smoker    Packs/day: 1.00    Years: 30.00    Pack years: 30.00    Types: Cigarettes    Last attempt to quit: 08/11/2010    Years since quitting: 7.4  . Smokeless tobacco: Never Used  Substance and Sexual Activity  . Alcohol use: No  . Drug use: No  . Sexual activity: Not Currently  Lifestyle  . Physical activity:    Days per week: Not on file    Minutes per session: Not on file  . Stress: Not on file  Relationships  . Social connections:    Talks on phone: Not on file    Gets together: Not on file    Attends religious  service: Not on file    Active member of club or organization: Not on file    Attends meetings of clubs or organizations: Not on file    Relationship status: Not on file  Other Topics Concern  . Not on file  Social History Narrative  . Not on file     Family History: The patient's family history includes Alcohol abuse in her father; Cancer in her unknown relative; Heart attack in her father, mother, and unknown  relative; Heart failure in her unknown relative; Hypertension in her father and mother. There is no history of Anesthesia problems, Hypotension, Malignant hyperthermia, or Pseudochol deficiency. ROS:   Please see the history of present illness.    All 14 point review of systems negative except as described per history of present illness  EKGs/Labs/Other Studies Reviewed:      Recent Labs: 06/30/2017: BUN 26; Creatinine, Ser 0.99; NT-Pro BNP 549; Potassium 4.6; Sodium 140  Recent Lipid Panel    Component Value Date/Time   CHOL 170 06/30/2017 0000   TRIG 160 (H) 06/30/2017 0000   HDL 64 06/30/2017 0000   CHOLHDL 2.7 06/30/2017 0000   LDLCALC 74 06/30/2017 0000    Physical Exam:    VS:  BP 118/68 (BP Location: Right Arm, Patient Position: Sitting, Cuff Size: Normal)   Pulse 77   Ht 5' (1.524 m)   Wt 131 lb (59.4 kg)   SpO2 98%   BMI 25.58 kg/m     Wt Readings from Last 3 Encounters:  01/28/18 131 lb (59.4 kg)  06/30/17 121 lb (54.9 kg)  03/26/15 135 lb (61.2 kg)     GEN:  Well nourished, well developed in no acute distress HEENT: Normal NECK: No JVD; No carotid bruits LYMPHATICS: No lymphadenopathy CARDIAC: RRR, no murmurs, no rubs, no gallops RESPIRATORY:  Clear to auscultation without rales, wheezing or rhonchi  ABDOMEN: Soft, non-tender, non-distended MUSCULOSKELETAL:  No edema; No deformity  SKIN: Warm and dry LOWER EXTREMITIES: no swelling NEUROLOGIC:  Alert and oriented x 3 PSYCHIATRIC:  Normal affect   ASSESSMENT:    1. Dilated cardiomyopathy  ([place])   2. Coronary artery disease involving native coronary artery of native heart without angina pectoris   3. Essential hypertension   4. 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   5. Dyslipidemia    PLAN:    In order of problems listed above:  1. Dilated cardiomyopathy: Plan as outlined above.  We will switch her from lisinopril to [facility] 2426 twice daily.  Will check Chem-7 week later. 2. Coronary artery disease.  Denies have any symptoms but this is something that need to be investigated in the future.  She did have a cardiac catheterization in 2016 which showed normal coronary arteries. 3. Essential hypertension: Blood pressure well controlled continue present management. 4. Dual ICD to Medtronic device we will invite Medtronic rep to interrogate device 5. Dyslipidemia: She is on Crestor which I will continue   Medication Adjustments/Labs and Tests Ordered: Current medicines are reviewed at length with the patient today.  Concerns regarding medicines are outlined above.  No orders of the defined types were placed in this encounter.  Medication changes: No orders of the defined types were placed in this encounter.   Signed, Park Liter, MD, [facility] 01/28/2018 11:21 AM    [place]

## 2018-02-06 DIAGNOSIS — I10 Essential (primary) hypertension: Secondary | ICD-10-CM | POA: Diagnosis not present

## 2018-02-07 LAB — BASIC METABOLIC PANEL
BUN/Creatinine Ratio: 25 (ref 12–28)
BUN: 22 mg/dL (ref 8–27)
CO2: 25 mmol/L (ref 20–29)
Calcium: 9.6 mg/dL (ref 8.7–10.3)
Chloride: 103 mmol/L (ref 96–106)
Creatinine, Ser: 0.89 mg/dL (ref 0.57–1.00)
GFR calc Af Amer: 80 mL/min/{1.73_m2} (ref >59)
GFR calc non Af Amer: 69 mL/min/{1.73_m2} (ref >59)
Glucose: 96 mg/dL (ref 65–99)
Potassium: 5.6 mmol/L — ABNORMAL HIGH (ref 3.5–5.2)
Sodium: 137 mmol/L (ref 134–144)

## 2018-02-10 ENCOUNTER — Telehealth: Payer: Self-pay | Admitting: *Deleted

## 2018-02-10 DIAGNOSIS — I10 Essential (primary) hypertension: Secondary | ICD-10-CM

## 2018-02-10 NOTE — Telephone Encounter (Signed)
Patient informed of lab results and advised to stop taking potassium supplements. Informed patient to go to [facility] in [place] on Thursday or Friday this week for follow up lab work. Patient verbalized understanding. No further questions.

## 2018-02-10 NOTE — Telephone Encounter (Signed)
-----   Message from Park Liter, MD sent at 02/09/2018  9:03 AM EDT ----- D/c potassium, chem7 on thursday

## 2018-02-13 DIAGNOSIS — I10 Essential (primary) hypertension: Secondary | ICD-10-CM | POA: Diagnosis not present

## 2018-02-13 LAB — BASIC METABOLIC PANEL
BUN/Creatinine Ratio: 19 (ref 12–28)
BUN: 18 mg/dL (ref 8–27)
CO2: 27 mmol/L (ref 20–29)
Calcium: 10.4 mg/dL — ABNORMAL HIGH (ref 8.7–10.3)
Chloride: 103 mmol/L (ref 96–106)
Creatinine, Ser: 0.94 mg/dL (ref 0.57–1.00)
GFR calc Af Amer: 75 mL/min/{1.73_m2} (ref >59)
GFR calc non Af Amer: 65 mL/min/{1.73_m2} (ref >59)
Glucose: 114 mg/dL — ABNORMAL HIGH (ref 65–99)
Potassium: 4.2 mmol/L (ref 3.5–5.2)
Sodium: 139 mmol/L (ref 134–144)

## 2018-02-18 DIAGNOSIS — R197 Diarrhea, unspecified: Secondary | ICD-10-CM | POA: Diagnosis not present

## 2018-02-19 DIAGNOSIS — R197 Diarrhea, unspecified: Secondary | ICD-10-CM | POA: Diagnosis not present

## 2018-02-23 DIAGNOSIS — M419 Scoliosis, unspecified: Secondary | ICD-10-CM | POA: Diagnosis not present

## 2018-02-23 DIAGNOSIS — G8929 Other chronic pain: Secondary | ICD-10-CM | POA: Diagnosis not present

## 2018-02-23 DIAGNOSIS — I5020 Unspecified systolic (congestive) heart failure: Secondary | ICD-10-CM | POA: Diagnosis not present

## 2018-02-23 DIAGNOSIS — Z79899 Other long term (current) drug therapy: Secondary | ICD-10-CM | POA: Diagnosis not present

## 2018-02-23 DIAGNOSIS — M545 Low back pain: Secondary | ICD-10-CM | POA: Diagnosis not present

## 2018-02-23 DIAGNOSIS — M5136 Other intervertebral disc degeneration, lumbar region: Secondary | ICD-10-CM | POA: Diagnosis not present

## 2018-02-27 ENCOUNTER — Ambulatory Visit: Payer: Medicare Other | Admitting: Cardiology

## 2018-02-27 ENCOUNTER — Ambulatory Visit (INDEPENDENT_AMBULATORY_CARE_PROVIDER_SITE_OTHER): Payer: Medicare Other | Admitting: Cardiology

## 2018-02-27 ENCOUNTER — Encounter: Payer: Self-pay | Admitting: Cardiology

## 2018-02-27 VITALS — HR 80 | Ht 61.0 in | Wt 135.0 lb

## 2018-02-27 DIAGNOSIS — I5022 Chronic systolic (congestive) heart failure: Secondary | ICD-10-CM

## 2018-02-27 DIAGNOSIS — Z95810 Presence of automatic (implantable) cardiac defibrillator: Secondary | ICD-10-CM

## 2018-02-27 DIAGNOSIS — M79662 Pain in left lower leg: Secondary | ICD-10-CM | POA: Diagnosis not present

## 2018-02-27 DIAGNOSIS — J449 Chronic obstructive pulmonary disease, unspecified: Secondary | ICD-10-CM

## 2018-02-27 DIAGNOSIS — M79661 Pain in right lower leg: Secondary | ICD-10-CM | POA: Diagnosis not present

## 2018-02-27 DIAGNOSIS — M7989 Other specified soft tissue disorders: Secondary | ICD-10-CM | POA: Diagnosis not present

## 2018-02-27 DIAGNOSIS — I10 Essential (primary) hypertension: Secondary | ICD-10-CM | POA: Diagnosis not present

## 2018-02-27 DIAGNOSIS — I2510 Atherosclerotic heart disease of native coronary artery without angina pectoris: Secondary | ICD-10-CM

## 2018-02-27 NOTE — Progress Notes (Signed)
Cardiology Office Note:    Date:  02/27/2018   ID:  [name], DOB 18-Jun-1956, MRN [id number]  PCP:  Lillard Anes, MD  Cardiologist:  Jenne Campus, MD    Referring MD: Lillard Anes,*   No chief complaint on file. Doing fine  History of Present Illness:    [name] is a 62 y.o. female with cardiomyopathy.  She did have a cardiac catheterization few years ago which was normal now we discovered her heart ejection fraction is 30 to 35% gradually and try to put her on appropriate medication the biggest limitations hypotension.  She is able to tolerate small dose of Entresto as well as beta-blocker.  She described the fact that she did receive few shocks from her defibrillator and she said she always get them will call [employer] to find out exactly what was recorded on her telemetry.  Previously she did have some antitachycardia pacing.  Past Medical History:  Diagnosis Date  . AICD (automatic cardioverter/defibrillator) present 2012   . Arthritis   . Arthritis   . Asthma   . CHF (congestive heart failure) ([place])   . Chronic back pain   . COPD (chronic obstructive pulmonary disease) ([place])   . GERD (gastroesophageal reflux disease)   . Heart failure   . HLD (hyperlipidemia)   . Hypertension   . ICD (implantable cardiac defibrillator) in place 2012  . Left ventricular systolic dysfunction   . Myocardial infarction [facility])    2011 during intestinal blockage surgery  . NICM (nonischemic cardiomyopathy) ([place])   . PTSD (post-traumatic stress disorder)   . PVCs (premature ventricular contractions)   . Thyroid disease     Past Surgical History:  Procedure Laterality Date  . APPENDECTOMY    . CARDIAC CATHETERIZATION    . CARPAL TUNNEL RELEASE    . CESAREAN SECTION    . CHOLECYSTECTOMY    . CORONARY ANGIOPLASTY    . HAND SURGERY    . ICD IMPLANT     Medtronic  . intestinal blockage 2011    . LAPAROSCOPIC ASSISTED VENTRAL HERNIA REPAIR N/A 03/23/2015     Procedure: LAPAROSCOPIC VENTRAL WALL HERNIA REPAIR;  Surgeon: Michael Boston, MD;  Location: [facility];  Service: General;  Laterality: N/A;  With MESH  . LAPAROSCOPIC LYSIS OF ADHESIONS N/A 03/23/2015   Procedure: LAPAROSCOPIC LYSIS OF ADHESIONS;  Surgeon: Michael Boston, MD;  Location: [facility];  Service: General;  Laterality: N/A;  . NECK SURGERY     fused  . TONSILLECTOMY    . ULNAR NERVE TRANSPOSITION  01/23/2012   Procedure: ULNAR NERVE DECOMPRESSION/TRANSPOSITION;  Surgeon: Ophelia Charter, MD;  Location: [place] NEUR[facility] ORS;  Service: Neurosurgery;  Laterality: Left;  LEFT ulnar nerve decompression    Current Medications: Current Meds  Medication Sig  . albuterol-ipratropium (COMBIVENT) 18-103 MCG/ACT inhaler Inhale 2 puffs into the lungs every 6 (six) hours as needed. For shortness of breath  . aspirin 81 MG tablet Take 81 mg by mouth daily.    Marland Kitchen BELBUCA 300 MCG FILM Place 1 patch under the tongue every 12 (twelve) hours.  . carvedilol (COREG) 3.125 MG tablet Take 1 tablet (3.125 mg total) by mouth 2 (two) times daily with a meal.  . celecoxib (CELEBREX) 200 MG capsule Take 1 capsule by mouth daily.  . diazepam (VALIUM) 5 MG tablet Take 1 tablet by mouth every 6 (six) hours as needed for anxiety.   Marland Kitchen esomeprazole (NEXIUM) 20 MG capsule Take 20 mg by  mouth daily at 12 noon.  . fluticasone furoate-vilanterol (BREO ELLIPTA) 100-25 MCG/INH AEPB Inhale 1 puff into the lungs daily.  . furosemide (LASIX) 20 MG tablet Take 20 mg by mouth daily.  Marland Kitchen gabapentin (NEURONTIN) 300 MG capsule Take 1 capsule by mouth 3 (three) times daily.  Marland Kitchen HYDROcodone-acetaminophen (NORCO) 10-325 MG per tablet Take 1-2 tablets by mouth every 4 (four) hours as needed for moderate pain or severe pain. (Patient taking differently: Take 1 tablet by mouth every 6 (six) hours as needed for moderate pain or severe pain. )  . ipratropium (ATROVENT) 0.02 % nebulizer solution Take 0.5 mg by nebulization every 4 (four) hours as needed  for wheezing or shortness of breath.  . linaclotide (LINZESS) 145 MCG CAPS capsule Take 1 capsule by mouth daily.  . nitroGLYCERIN (NITROSTAT) 0.4 MG SL tablet Place 0.4 mg under the tongue every 5 (five) minutes as needed for chest pain.  . ranolazine (RANEXA) 500 MG 12 hr tablet Take 500 mg by mouth 2 (two) times daily.  . rosuvastatin (CRESTOR) 10 MG tablet Take 10 mg by mouth daily.  . sacubitril-valsartan (ENTRESTO) 24-26 MG Take 1 tablet by mouth 2 (two) times daily.     Allergies:   Patient has no known allergies.   Social History   Socioeconomic History  . Marital status: Single    Spouse name: Not on file  . Number of children: 3  . Years of education: Not on file  . Highest education level: Not on file  Occupational History  . Occupation: disabled  Social Needs  . Financial resource strain: Not on file  . Food insecurity:    Worry: Not on file    Inability: Not on file  . Transportation needs:    Medical: Not on file    Non-medical: Not on file  Tobacco Use  . Smoking status: Former Smoker    Packs/day: 1.00    Years: 30.00    Pack years: 30.00    Types: Cigarettes    Last attempt to quit: 08/11/2010    Years since quitting: 7.5  . Smokeless tobacco: Never Used  Substance and Sexual Activity  . Alcohol use: No  . Drug use: No  . Sexual activity: Not Currently  Lifestyle  . Physical activity:    Days per week: Not on file    Minutes per session: Not on file  . Stress: Not on file  Relationships  . Social connections:    Talks on phone: Not on file    Gets together: Not on file    Attends religious service: Not on file    Active member of club or organization: Not on file    Attends meetings of clubs or organizations: Not on file    Relationship status: Not on file  Other Topics Concern  . Not on file  Social History Narrative  . Not on file     Family History: The patient's family history includes Alcohol abuse in her father; Cancer in her unknown  relative; Heart attack in her father, mother, and unknown relative; Heart failure in her unknown relative; Hypertension in her father and mother. There is no history of Anesthesia problems, Hypotension, Malignant hyperthermia, or Pseudochol deficiency. ROS:   Please see the history of present illness.    All 14 point review of systems negative except as described per history of present illness  EKGs/Labs/Other Studies Reviewed:      Recent Labs: 06/30/2017: NT-Pro BNP 549 02/13/2018: BUN 18;  Creatinine, Ser 0.94; Potassium 4.2; Sodium 139  Recent Lipid Panel    Component Value Date/Time   CHOL 170 06/30/2017 0000   TRIG 160 (H) 06/30/2017 0000   HDL 64 06/30/2017 0000   CHOLHDL 2.7 06/30/2017 0000   LDLCALC 74 06/30/2017 0000    Physical Exam:    VS:  Pulse 80   Ht 5\' 1"  (1.549 m)   Wt 135 lb (61.2 kg)   SpO2 95%   BMI 25.51 kg/m     Wt Readings from Last 3 Encounters:  02/27/18 135 lb (61.2 kg)  01/28/18 131 lb (59.4 kg)  06/30/17 121 lb (54.9 kg)     GEN:  Well nourished, well developed in no acute distress HEENT: Normal NECK: No JVD; No carotid bruits LYMPHATICS: No lymphadenopathy CARDIAC: RRR, no murmurs, no rubs, no gallops RESPIRATORY:  Clear to auscultation without rales, wheezing or rhonchi  ABDOMEN: Soft, non-tender, non-distended MUSCULOSKELETAL:  No edema; No deformity  SKIN: Warm and dry LOWER EXTREMITIES: n minimal swelling however worse on the left than on the right with minimal tenderness to the left calf NEUROLOGIC:  Alert and oriented x 3 PSYCHIATRIC:  Normal affect    ASSESSMENT:    1. Chronic systolic congestive heart failure, NYHA class 2 ([place])   2. Essential hypertension   3. Coronary artery disease involving native coronary artery of native heart without angina pectoris   4. Chronic obstructive pulmonary disease, unspecified COPD type ([place])   5. 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 Chronic systolic congestive heart failure New York Heart Association class II on appropriate medication she is able to tolerate.  Sadly I cannot increase dosages of this medication because of low blood pressure. 2. Essential hypertension blood pressure being low. 3. Chronic obstructive pulmonary disease stable. 4. Dual ICD in place will call [employer] to talk about recording from the device.  She does have asymmetrical swelling of lower extremities.  I will ask her to have DVT study to be done.   Medication Adjustments/Labs and Tests Ordered: Current medicines are reviewed at length with the patient today.  Concerns regarding medicines are outlined above.  No orders of the defined types were placed in this encounter.  Medication changes: No orders of the defined types were placed in this encounter.   Signed, Park Liter, MD, [facility] 02/27/2018 2:34 PM    [place]

## 2018-02-27 NOTE — Patient Instructions (Signed)
Medication Instructions:  Your physician recommends that you continue on your current medications as directed. Please refer to the Current Medication list given to you today.  Labwork: None  Testing/Procedures: Your physician has requested that you have a lower or upper extremity venous duplex. This test is an ultrasound of the veins in the legs or arms. It looks at venous blood flow that carries blood from the heart to the legs or arms. Allow one hour for a Lower Venous exam. Allow thirty minutes for an Upper Venous exam. There are no restrictions or special instructions.   Follow-Up: Your physician recommends that you schedule a follow-up appointment in: 2 months  Any Other Special Instructions Will Be Listed Below (If Applicable).     If you need a refill on your cardiac medications before your next appointment, please call your pharmacy.   [place], RN, BSN

## 2018-03-02 ENCOUNTER — Other Ambulatory Visit: Payer: Self-pay | Admitting: *Deleted

## 2018-03-02 DIAGNOSIS — I5022 Chronic systolic (congestive) heart failure: Secondary | ICD-10-CM

## 2018-03-04 DIAGNOSIS — M25561 Pain in right knee: Secondary | ICD-10-CM | POA: Diagnosis not present

## 2018-03-04 DIAGNOSIS — M1711 Unilateral primary osteoarthritis, right knee: Secondary | ICD-10-CM | POA: Diagnosis not present

## 2018-03-06 ENCOUNTER — Telehealth: Payer: Self-pay | Admitting: Cardiology

## 2018-03-06 DIAGNOSIS — I5022 Chronic systolic (congestive) heart failure: Secondary | ICD-10-CM

## 2018-03-06 MED ORDER — POTASSIUM CHLORIDE ER 10 MEQ PO TBCR: 10.0000 meq | EXTENDED_RELEASE_TABLET | Freq: Every day | ORAL | 3 refills | Status: DC

## 2018-03-06 MED ORDER — FUROSEMIDE 20 MG PO TABS: ORAL_TABLET | ORAL | 3 refills | Status: DC

## 2018-03-06 NOTE — Telephone Encounter (Signed)
Wants results of foot scan and they are still swelling like balloons

## 2018-03-06 NOTE — Telephone Encounter (Signed)
Patient informed of bilateral lower extremity venous doppler was negative for DVT. Patient explained that her legs and ankles are still swelling, left greater than the right. Patient has increased furosemide 20 mg twice daily on her own. She has been elevating her legs as much as possible. Patient's most recent blood pressure was 97/64 and heart rate was 67 this morning.   Dr. Agustin Cree advised to increase furosemide to 40 mg in the morning and 20 mg in the evening. He also recommends that patient start taking potassium 10 mEq daily and have follow up lab work done on Monday, 03/09/18 in the [place] office. Informed patient that she does not need an appointment and she does not have to fast beforehand. Patient verbalized understanding. No further questions. Prescription for potassium and refill for furosemide sent to [facility] in [place] per patient's request.

## 2018-03-09 DIAGNOSIS — I5022 Chronic systolic (congestive) heart failure: Secondary | ICD-10-CM | POA: Diagnosis not present

## 2018-03-10 LAB — BASIC METABOLIC PANEL
BUN/Creatinine Ratio: 20 (ref 12–28)
BUN: 28 mg/dL — ABNORMAL HIGH (ref 8–27)
CO2: 25 mmol/L (ref 20–29)
Calcium: 9.9 mg/dL (ref 8.7–10.3)
Chloride: 101 mmol/L (ref 96–106)
Creatinine, Ser: 1.39 mg/dL — ABNORMAL HIGH (ref 0.57–1.00)
GFR calc Af Amer: 47 mL/min/{1.73_m2} — ABNORMAL LOW (ref >59)
GFR calc non Af Amer: 40 mL/min/{1.73_m2} — ABNORMAL LOW (ref >59)
Glucose: 93 mg/dL (ref 65–99)
Potassium: 4.6 mmol/L (ref 3.5–5.2)
Sodium: 140 mmol/L (ref 134–144)

## 2018-03-12 DIAGNOSIS — M25562 Pain in left knee: Secondary | ICD-10-CM | POA: Diagnosis not present

## 2018-03-13 ENCOUNTER — Telehealth: Payer: Self-pay | Admitting: *Deleted

## 2018-03-13 DIAGNOSIS — I5022 Chronic systolic (congestive) heart failure: Secondary | ICD-10-CM

## 2018-03-13 NOTE — Telephone Encounter (Signed)
Patient informed of lab results and advised to go to the [place] office for repeat lab work in 10 days, 03/23/18, no appointment needed. Patient verbalized understanding. No further questions.

## 2018-03-13 NOTE — Telephone Encounter (Signed)
-----   Message from Park Liter, MD sent at 03/12/2018  9:12 AM EDT ----- Creat increased, cont same, chem7 in 10 days

## 2018-03-23 DIAGNOSIS — I5022 Chronic systolic (congestive) heart failure: Secondary | ICD-10-CM | POA: Diagnosis not present

## 2018-03-24 LAB — BASIC METABOLIC PANEL
BUN/Creatinine Ratio: 18 (ref 12–28)
BUN: 20 mg/dL (ref 8–27)
CO2: 25 mmol/L (ref 20–29)
Calcium: 10.3 mg/dL (ref 8.7–10.3)
Chloride: 102 mmol/L (ref 96–106)
Creatinine, Ser: 1.12 mg/dL — ABNORMAL HIGH (ref 0.57–1.00)
GFR calc Af Amer: 60 mL/min/{1.73_m2} (ref >59)
GFR calc non Af Amer: 52 mL/min/{1.73_m2} — ABNORMAL LOW (ref >59)
Glucose: 74 mg/dL (ref 65–99)
Potassium: 4.6 mmol/L (ref 3.5–5.2)
Sodium: 142 mmol/L (ref 134–144)

## 2018-03-26 DIAGNOSIS — G8929 Other chronic pain: Secondary | ICD-10-CM | POA: Diagnosis not present

## 2018-03-26 DIAGNOSIS — I5020 Unspecified systolic (congestive) heart failure: Secondary | ICD-10-CM | POA: Diagnosis not present

## 2018-03-26 DIAGNOSIS — M545 Low back pain: Secondary | ICD-10-CM | POA: Diagnosis not present

## 2018-03-26 DIAGNOSIS — Z79899 Other long term (current) drug therapy: Secondary | ICD-10-CM | POA: Diagnosis not present

## 2018-03-26 DIAGNOSIS — M546 Pain in thoracic spine: Secondary | ICD-10-CM | POA: Diagnosis not present

## 2018-03-26 DIAGNOSIS — M419 Scoliosis, unspecified: Secondary | ICD-10-CM | POA: Diagnosis not present

## 2018-03-30 DIAGNOSIS — I10 Essential (primary) hypertension: Secondary | ICD-10-CM | POA: Diagnosis not present

## 2018-03-30 DIAGNOSIS — I5022 Chronic systolic (congestive) heart failure: Secondary | ICD-10-CM | POA: Diagnosis not present

## 2018-03-30 DIAGNOSIS — E782 Mixed hyperlipidemia: Secondary | ICD-10-CM | POA: Diagnosis not present

## 2018-03-30 DIAGNOSIS — J449 Chronic obstructive pulmonary disease, unspecified: Secondary | ICD-10-CM | POA: Diagnosis not present

## 2018-04-03 DIAGNOSIS — S61431A Puncture wound without foreign body of right hand, initial encounter: Secondary | ICD-10-CM | POA: Diagnosis not present

## 2018-04-13 DIAGNOSIS — Z4502 Encounter for adjustment and management of automatic implantable cardiac defibrillator: Secondary | ICD-10-CM | POA: Diagnosis not present

## 2018-04-14 DIAGNOSIS — M71551 Other bursitis, not elsewhere classified, right hip: Secondary | ICD-10-CM | POA: Diagnosis not present

## 2018-04-23 DIAGNOSIS — M7071 Other bursitis of hip, right hip: Secondary | ICD-10-CM | POA: Diagnosis not present

## 2018-04-24 DIAGNOSIS — M25559 Pain in unspecified hip: Secondary | ICD-10-CM | POA: Diagnosis not present

## 2018-04-24 DIAGNOSIS — M545 Low back pain: Secondary | ICD-10-CM | POA: Diagnosis not present

## 2018-04-24 DIAGNOSIS — G8929 Other chronic pain: Secondary | ICD-10-CM | POA: Diagnosis not present

## 2018-04-24 DIAGNOSIS — Z79899 Other long term (current) drug therapy: Secondary | ICD-10-CM | POA: Diagnosis not present

## 2018-04-24 DIAGNOSIS — M5136 Other intervertebral disc degeneration, lumbar region: Secondary | ICD-10-CM | POA: Diagnosis not present

## 2018-05-15 DIAGNOSIS — I5020 Unspecified systolic (congestive) heart failure: Secondary | ICD-10-CM | POA: Diagnosis not present

## 2018-05-22 DIAGNOSIS — Z23 Encounter for immunization: Secondary | ICD-10-CM | POA: Diagnosis not present

## 2018-05-22 DIAGNOSIS — G8929 Other chronic pain: Secondary | ICD-10-CM | POA: Diagnosis not present

## 2018-05-22 DIAGNOSIS — M4802 Spinal stenosis, cervical region: Secondary | ICD-10-CM | POA: Diagnosis not present

## 2018-05-22 DIAGNOSIS — Z79899 Other long term (current) drug therapy: Secondary | ICD-10-CM | POA: Diagnosis not present

## 2018-05-22 DIAGNOSIS — M545 Low back pain: Secondary | ICD-10-CM | POA: Diagnosis not present

## 2018-05-22 DIAGNOSIS — M5136 Other intervertebral disc degeneration, lumbar region: Secondary | ICD-10-CM | POA: Diagnosis not present

## 2018-06-11 DIAGNOSIS — R100 Acute abdomen: Secondary | ICD-10-CM | POA: Diagnosis not present

## 2018-06-11 DIAGNOSIS — K5669 Other partial intestinal obstruction: Secondary | ICD-10-CM | POA: Diagnosis not present

## 2018-06-11 DIAGNOSIS — H9202 Otalgia, left ear: Secondary | ICD-10-CM | POA: Diagnosis not present

## 2018-06-11 DIAGNOSIS — Z6824 Body mass index (BMI) 24.0-24.9, adult: Secondary | ICD-10-CM | POA: Diagnosis not present

## 2018-06-11 DIAGNOSIS — K639 Disease of intestine, unspecified: Secondary | ICD-10-CM | POA: Diagnosis not present

## 2018-06-11 DIAGNOSIS — M7061 Trochanteric bursitis, right hip: Secondary | ICD-10-CM | POA: Diagnosis not present

## 2018-06-11 DIAGNOSIS — K6389 Other specified diseases of intestine: Secondary | ICD-10-CM | POA: Diagnosis not present

## 2018-06-15 DIAGNOSIS — I5020 Unspecified systolic (congestive) heart failure: Secondary | ICD-10-CM | POA: Diagnosis not present

## 2018-06-16 DIAGNOSIS — H6501 Acute serous otitis media, right ear: Secondary | ICD-10-CM | POA: Diagnosis not present

## 2018-06-16 DIAGNOSIS — H938X1 Other specified disorders of right ear: Secondary | ICD-10-CM | POA: Diagnosis not present

## 2018-06-16 DIAGNOSIS — H6981 Other specified disorders of Eustachian tube, right ear: Secondary | ICD-10-CM | POA: Diagnosis not present

## 2018-06-16 DIAGNOSIS — Z9622 Myringotomy tube(s) status: Secondary | ICD-10-CM | POA: Diagnosis not present

## 2018-06-18 DIAGNOSIS — M1712 Unilateral primary osteoarthritis, left knee: Secondary | ICD-10-CM | POA: Diagnosis not present

## 2018-06-18 DIAGNOSIS — M1711 Unilateral primary osteoarthritis, right knee: Secondary | ICD-10-CM | POA: Diagnosis not present

## 2018-06-19 DIAGNOSIS — M5136 Other intervertebral disc degeneration, lumbar region: Secondary | ICD-10-CM | POA: Diagnosis not present

## 2018-06-19 DIAGNOSIS — M545 Low back pain: Secondary | ICD-10-CM | POA: Diagnosis not present

## 2018-06-19 DIAGNOSIS — M419 Scoliosis, unspecified: Secondary | ICD-10-CM | POA: Diagnosis not present

## 2018-06-19 DIAGNOSIS — G8929 Other chronic pain: Secondary | ICD-10-CM | POA: Diagnosis not present

## 2018-06-19 DIAGNOSIS — Z79899 Other long term (current) drug therapy: Secondary | ICD-10-CM | POA: Diagnosis not present

## 2018-06-23 ENCOUNTER — Ambulatory Visit: Payer: Medicare Other | Admitting: Cardiology

## 2018-06-23 ENCOUNTER — Ambulatory Visit (INDEPENDENT_AMBULATORY_CARE_PROVIDER_SITE_OTHER): Payer: Medicare Other | Admitting: Cardiology

## 2018-06-23 ENCOUNTER — Encounter: Payer: Self-pay | Admitting: Cardiology

## 2018-06-23 VITALS — BP 116/64 | HR 72 | Ht 61.0 in | Wt 129.4 lb

## 2018-06-23 DIAGNOSIS — Z95810 Presence of automatic (implantable) cardiac defibrillator: Secondary | ICD-10-CM

## 2018-06-23 DIAGNOSIS — I420 Dilated cardiomyopathy: Secondary | ICD-10-CM

## 2018-06-23 DIAGNOSIS — I5022 Chronic systolic (congestive) heart failure: Secondary | ICD-10-CM

## 2018-06-23 DIAGNOSIS — E785 Hyperlipidemia, unspecified: Secondary | ICD-10-CM

## 2018-06-23 DIAGNOSIS — I10 Essential (primary) hypertension: Secondary | ICD-10-CM | POA: Diagnosis not present

## 2018-06-23 NOTE — Patient Instructions (Signed)
Medication Instructions:  Your physician recommends that you continue on your current medications as directed. Please refer to the Current Medication list given to you today.  If you need a refill on your cardiac medications before your next appointment, please call your pharmacy.   Lab work: Your physician recommends that you return for lab work today: BMP, CBC, BNP   If you have labs (blood work) drawn today and your tests are completely normal, you will receive your results only by: Marland Kitchen MyChart Message (if you have MyChart) OR . A paper copy in the mail If you have any lab test that is abnormal or we need to change your treatment, we will call you to review the results.  Testing/Procedures: None.   Follow-Up: At [facility], you and your health needs are our priority.  As part of our continuing mission to provide you with exceptional heart care, we have created designated Provider Care Teams.  These Care Teams include your primary Cardiologist (physician) and Advanced Practice Providers (APPs -  Physician Assistants and Nurse Practitioners) who all work together to provide you with the care you need, when you need it. You will need a follow up appointment in 1 months.  Please call our office 2 months in advance to schedule this appointment.  You may see No primary care provider on file. or another member of our [employer] Provider Team in [place]: Shirlee More, MD . Jyl Heinz, MD  Any Other Special Instructions Will Be Listed Below (If Applicable).  Dr. Agustin Cree has referred you to see Dr. Curt Bears for device management.   You will have a nurse visit to have device interrogated Thursday 06/25/18 at 10 am

## 2018-06-23 NOTE — Progress Notes (Signed)
Cardiology Office Note:    Date:  06/23/2018   ID:  [name], DOB [birth year], MRN [id number]  PCP:  Lillard Anes, MD  Cardiologist:  Jenne Campus, MD    Referring MD: Lillard Anes,*   Chief Complaint  Patient presents with  . 2 month follow up  Weak and tired  History of Present Illness:    [name] is a 62 y.o. female with nonischemic cardiomyopathy latest estimation left ventricular ejection fraction showed ejection fraction 30 to 35%.  She does have Medtronic ICD implanted.  Overall she complained of being short of breath tired and exhausted symptoms swelling of lower extremities in the evening time.  No chest pain tightness squeezing pressure burning chest no discharge from the defibrillator no dizziness.  However Past Medical History:  Diagnosis Date  . AICD (automatic cardioverter/defibrillator) present 2012   . Arthritis   . Arthritis   . Asthma   . CHF (congestive heart failure) ([place])   . Chronic back pain   . COPD (chronic obstructive pulmonary disease) ([place])   . GERD (gastroesophageal reflux disease)   . Heart failure   . HLD (hyperlipidemia)   . Hypertension   . ICD (implantable cardiac defibrillator) in place 2012  . Left ventricular systolic dysfunction   . Myocardial infarction [facility])    2011 during intestinal blockage surgery  . NICM (nonischemic cardiomyopathy) ([place])   . PTSD (post-traumatic stress disorder)   . PVCs (premature ventricular contractions)   . Thyroid disease     Past Surgical History:  Procedure Laterality Date  . APPENDECTOMY    . CARDIAC CATHETERIZATION    . CARPAL TUNNEL RELEASE    . CESAREAN SECTION    . CHOLECYSTECTOMY    . CORONARY ANGIOPLASTY    . HAND SURGERY    . ICD IMPLANT     Medtronic  . intestinal blockage 2011    . LAPAROSCOPIC ASSISTED VENTRAL HERNIA REPAIR N/A 03/23/2015   Procedure: LAPAROSCOPIC VENTRAL WALL HERNIA REPAIR;  Surgeon: Michael Boston, MD;  Location: [facility];  Service:  General;  Laterality: N/A;  With MESH  . LAPAROSCOPIC LYSIS OF ADHESIONS N/A 03/23/2015   Procedure: LAPAROSCOPIC LYSIS OF ADHESIONS;  Surgeon: Michael Boston, MD;  Location: [facility];  Service: General;  Laterality: N/A;  . NECK SURGERY     fused  . TONSILLECTOMY    . ULNAR NERVE TRANSPOSITION  01/23/2012   Procedure: ULNAR NERVE DECOMPRESSION/TRANSPOSITION;  Surgeon: Ophelia Charter, MD;  Location: [place] NEURO ORS;  Service: Neurosurgery;  Laterality: Left;  LEFT ulnar nerve decompression    Current Medications: Current Meds  Medication Sig  . albuterol-ipratropium (COMBIVENT) 18-103 MCG/ACT inhaler Inhale 2 puffs into the lungs every 6 (six) hours as needed. For shortness of breath  . aspirin 81 MG tablet Take 81 mg by mouth daily.    Marland Kitchen BELBUCA 300 MCG FILM Place 1 patch under the tongue every 12 (twelve) hours.  . carvedilol (COREG) 3.125 MG tablet Take 1 tablet (3.125 mg total) by mouth 2 (two) times daily with a meal.  . celecoxib (CELEBREX) 200 MG capsule Take 1 capsule by mouth daily.  . diazepam (VALIUM) 5 MG tablet Take 1 tablet by mouth every 6 (six) hours as needed for anxiety.   Marland Kitchen esomeprazole (NEXIUM) 20 MG capsule Take 20 mg by mouth daily at 12 noon.  . fluticasone (FLONASE) 50 MCG/ACT nasal spray Place 1 spray into both nostrils daily.  . fluticasone furoate-vilanterol (BREO ELLIPTA) 100-25  MCG/INH AEPB Inhale 1 puff into the lungs daily.  . furosemide (LASIX) 20 MG tablet Take 2 tablets (40 mg) in the morning and 1 tablet (20 mg) in the evening daily.  Marland Kitchen gabapentin (NEURONTIN) 300 MG capsule Take 1 capsule by mouth 3 (three) times daily.  Marland Kitchen HYDROcodone-acetaminophen (NORCO) 10-325 MG per tablet Take 1-2 tablets by mouth every 4 (four) hours as needed for moderate pain or severe pain. (Patient taking differently: Take 1 tablet by mouth every 6 (six) hours as needed for moderate pain or severe pain. )  . ipratropium (ATROVENT) 0.02 % nebulizer solution Take 0.5 mg by nebulization  every 4 (four) hours as needed for wheezing or shortness of breath.  . levocetirizine (XYZAL) 5 MG tablet Take 5 mg by mouth every evening.  . linaclotide (LINZESS) 145 MCG CAPS capsule Take 1 capsule by mouth daily.  . nitroGLYCERIN (NITROSTAT) 0.4 MG SL tablet Place 0.4 mg under the tongue every 5 (five) minutes as needed for chest pain.  . potassium chloride (K-DUR) 10 MEQ tablet Take 1 tablet (10 mEq total) by mouth daily.  . predniSONE (DELTASONE) 10 MG tablet   . ranolazine (RANEXA) 500 MG 12 hr tablet Take 500 mg by mouth 2 (two) times daily.  . rosuvastatin (CRESTOR) 10 MG tablet Take 10 mg by mouth daily.  . sacubitril-valsartan (ENTRESTO) 24-26 MG Take 1 tablet by mouth 2 (two) times daily.     Allergies:   Patient has no known allergies.   Social History   Socioeconomic History  . Marital status: Single    Spouse name: Not on file  . Number of children: 3  . Years of education: Not on file  . Highest education level: Not on file  Occupational History  . Occupation: disabled  Social Needs  . Financial resource strain: Not on file  . Food insecurity:    Worry: Not on file    Inability: Not on file  . Transportation needs:    Medical: Not on file    Non-medical: Not on file  Tobacco Use  . Smoking status: Former Smoker    Packs/day: 1.00    Years: 30.00    Pack years: 30.00    Types: Cigarettes    Last attempt to quit: 08/11/2010    Years since quitting: 7.8  . Smokeless tobacco: Never Used  Substance and Sexual Activity  . Alcohol use: No  . Drug use: No  . Sexual activity: Not Currently  Lifestyle  . Physical activity:    Days per week: Not on file    Minutes per session: Not on file  . Stress: Not on file  Relationships  . Social connections:    Talks on phone: Not on file    Gets together: Not on file    Attends religious service: Not on file    Active member of club or organization: Not on file    Attends meetings of clubs or organizations: Not on  file    Relationship status: Not on file  Other Topics Concern  . Not on file  Social History Narrative  . Not on file     Family History: The patient's family history includes Alcohol abuse in her father; Cancer in her unknown relative; Heart attack in her father, mother, and unknown relative; Heart failure in her unknown relative; Hypertension in her father and mother. There is no history of Anesthesia problems, Hypotension, Malignant hyperthermia, or Pseudochol deficiency. ROS:   Please see the history of  present illness.    All 14 point review of systems negative except as described per history of present illness  EKGs/Labs/Other Studies Reviewed:      Recent Labs: 06/30/2017: NT-Pro BNP 549 03/23/2018: BUN 20; Creatinine, Ser 1.12; Potassium 4.6; Sodium 142  Recent Lipid Panel    Component Value Date/Time   CHOL 170 06/30/2017 0000   TRIG 160 (H) 06/30/2017 0000   HDL 64 06/30/2017 0000   CHOLHDL 2.7 06/30/2017 0000   LDLCALC 74 06/30/2017 0000    Physical Exam:    VS:  BP 116/64   Pulse 72   Ht 5\' 1"  (1.549 m)   Wt 129 lb 6.4 oz (58.7 kg)   SpO2 98%   BMI 24.45 kg/m     Wt Readings from Last 3 Encounters:  06/23/18 129 lb 6.4 oz (58.7 kg)  02/27/18 135 lb (61.2 kg)  01/28/18 131 lb (59.4 kg)     GEN:  Well nourished, well developed in no acute distress HEENT: Normal NECK: No JVD; No carotid bruits LYMPHATICS: No lymphadenopathy CARDIAC: RRR, no murmurs, no rubs, no gallops RESPIRATORY:  Clear to auscultation without rales, wheezing or rhonchi  ABDOMEN: Soft, non-tender, non-distended MUSCULOSKELETAL:  No edema; No deformity  SKIN: Warm and dry LOWER EXTREMITIES: no swelling NEUROLOGIC:  Alert and oriented x 3 PSYCHIATRIC:  Normal affect   ASSESSMENT:    1. 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   2. Dilated cardiomyopathy ([place])   3. Chronic systolic congestive heart failure, NYHA class 2 ([place])   4. Dyslipidemia    PLAN:     In order of problems listed above:  1. Dual ICD this is a Medtronic device will make arrangements for interrogation of the device will enroll her in our pacemaker clinic.  Last interrogation in the middle of August showed close to ERI. 2. Dilated cardia myopathy with feeling not well.  She is on appropriate medication which I will continue however I will ask her to have an echocardiogram to assess left ventricular ejection fraction.  She is already on Entresto as well as on carvedilol. 3. Chronic systolic congestive heart failure New York Heart Association 2/3. 4. Dyslipidemia on statin which I will continue.  I will ask him to get Chem-7 as well as proBNP today.  Echocardiogram will be scheduled we will check device with pacemaker rep within next few days and I will also schedule her to see our pacemaker clinic.   Medication Adjustments/Labs and Tests Ordered: Current medicines are reviewed at length with the patient today.  Concerns regarding medicines are outlined above.  No orders of the defined types were placed in this encounter.  Medication changes: No orders of the defined types were placed in this encounter.   Signed, Park Liter, MD, [facility] 06/23/2018 2:43 PM    [place]

## 2018-06-24 LAB — BASIC METABOLIC PANEL
BUN/Creatinine Ratio: 25 (ref 12–28)
BUN: 22 mg/dL (ref 8–27)
CO2: 26 mmol/L (ref 20–29)
Calcium: 9.7 mg/dL (ref 8.7–10.3)
Chloride: 95 mmol/L — ABNORMAL LOW (ref 96–106)
Creatinine, Ser: 0.89 mg/dL (ref 0.57–1.00)
GFR calc Af Amer: 80 mL/min/{1.73_m2} (ref >59)
GFR calc non Af Amer: 69 mL/min/{1.73_m2} (ref >59)
Glucose: 94 mg/dL (ref 65–99)
Potassium: 4.4 mmol/L (ref 3.5–5.2)
Sodium: 140 mmol/L (ref 134–144)

## 2018-06-24 LAB — CBC
Hematocrit: 38 % (ref 34.0–46.6)
Hemoglobin: 12.7 g/dL (ref 11.1–15.9)
MCH: 30.2 pg (ref 26.6–33.0)
MCHC: 33.4 g/dL (ref 31.5–35.7)
MCV: 91 fL (ref 79–97)
Platelets: 458 10*3/uL — ABNORMAL HIGH (ref 150–450)
RBC: 4.2 x10E6/uL (ref 3.77–5.28)
RDW: 12.4 % (ref 12.3–15.4)
WBC: 21 10*3/uL (ref 3.4–10.8)

## 2018-06-24 LAB — PRO B NATRIURETIC PEPTIDE: NT-Pro BNP: 1087 pg/mL — ABNORMAL HIGH (ref 0–287)

## 2018-06-25 ENCOUNTER — Telehealth: Payer: Self-pay | Admitting: Emergency Medicine

## 2018-06-25 ENCOUNTER — Ambulatory Visit: Payer: Medicare Other

## 2018-06-25 ENCOUNTER — Telehealth: Payer: Self-pay

## 2018-06-25 DIAGNOSIS — I5022 Chronic systolic (congestive) heart failure: Secondary | ICD-10-CM

## 2018-06-25 MED ORDER — FUROSEMIDE 20 MG PO TABS: 40.0000 mg | ORAL_TABLET | Freq: Two times a day (BID) | ORAL | 2 refills | Status: DC

## 2018-06-25 NOTE — Telephone Encounter (Signed)
Left voicemail for the patient to call the office regarding labs.

## 2018-06-25 NOTE — Telephone Encounter (Signed)
Left a message with patient's appointment date and time for pacemaker interrogation Nov. 14th 2019 at 10 am

## 2018-06-25 NOTE — Telephone Encounter (Signed)
-----   Message from Park Liter, MD sent at 06/24/2018  8:37 PM EDT ----- Increase furosemide to 40 bid  Chem 7 in 1 week  WBC elevated  Needs to see her PMD for it

## 2018-06-28 ENCOUNTER — Other Ambulatory Visit: Payer: Self-pay | Admitting: Cardiology

## 2018-07-02 NOTE — Telephone Encounter (Signed)
Patient informed of appointment to have pacemaker interrogated on November 14th at 10 am. Patient verbally understands

## 2018-07-03 ENCOUNTER — Other Ambulatory Visit: Payer: Self-pay

## 2018-07-03 DIAGNOSIS — I5022 Chronic systolic (congestive) heart failure: Secondary | ICD-10-CM | POA: Diagnosis not present

## 2018-07-03 DIAGNOSIS — H6980 Other specified disorders of Eustachian tube, unspecified ear: Secondary | ICD-10-CM | POA: Diagnosis not present

## 2018-07-03 DIAGNOSIS — Z9622 Myringotomy tube(s) status: Secondary | ICD-10-CM | POA: Diagnosis not present

## 2018-07-04 LAB — BASIC METABOLIC PANEL
BUN/Creatinine Ratio: 20 (ref 12–28)
BUN: 18 mg/dL (ref 8–27)
CO2: 27 mmol/L (ref 20–29)
Calcium: 9.6 mg/dL (ref 8.7–10.3)
Chloride: 95 mmol/L — ABNORMAL LOW (ref 96–106)
Creatinine, Ser: 0.9 mg/dL (ref 0.57–1.00)
GFR calc Af Amer: 78 mL/min/{1.73_m2} (ref >59)
GFR calc non Af Amer: 68 mL/min/{1.73_m2} (ref >59)
Glucose: 101 mg/dL — ABNORMAL HIGH (ref 65–99)
Potassium: 4.6 mmol/L (ref 3.5–5.2)
Sodium: 139 mmol/L (ref 134–144)

## 2018-07-07 DIAGNOSIS — D72828 Other elevated white blood cell count: Secondary | ICD-10-CM | POA: Diagnosis not present

## 2018-07-09 ENCOUNTER — Encounter: Payer: Medicare Other | Admitting: Cardiology

## 2018-07-12 DIAGNOSIS — J449 Chronic obstructive pulmonary disease, unspecified: Secondary | ICD-10-CM | POA: Diagnosis not present

## 2018-07-15 ENCOUNTER — Other Ambulatory Visit: Payer: Self-pay | Admitting: Cardiology

## 2018-07-15 DIAGNOSIS — I5020 Unspecified systolic (congestive) heart failure: Secondary | ICD-10-CM | POA: Diagnosis not present

## 2018-07-20 DIAGNOSIS — M5136 Other intervertebral disc degeneration, lumbar region: Secondary | ICD-10-CM | POA: Diagnosis not present

## 2018-07-20 DIAGNOSIS — Z79899 Other long term (current) drug therapy: Secondary | ICD-10-CM | POA: Diagnosis not present

## 2018-07-20 DIAGNOSIS — G8929 Other chronic pain: Secondary | ICD-10-CM | POA: Diagnosis not present

## 2018-07-20 DIAGNOSIS — M419 Scoliosis, unspecified: Secondary | ICD-10-CM | POA: Diagnosis not present

## 2018-07-20 DIAGNOSIS — M545 Low back pain: Secondary | ICD-10-CM | POA: Diagnosis not present

## 2018-07-21 ENCOUNTER — Other Ambulatory Visit: Payer: Self-pay

## 2018-07-30 DIAGNOSIS — E782 Mixed hyperlipidemia: Secondary | ICD-10-CM | POA: Diagnosis not present

## 2018-07-30 DIAGNOSIS — D72829 Elevated white blood cell count, unspecified: Secondary | ICD-10-CM | POA: Diagnosis not present

## 2018-07-30 DIAGNOSIS — J449 Chronic obstructive pulmonary disease, unspecified: Secondary | ICD-10-CM | POA: Diagnosis not present

## 2018-07-30 DIAGNOSIS — I10 Essential (primary) hypertension: Secondary | ICD-10-CM | POA: Diagnosis not present

## 2018-08-10 ENCOUNTER — Encounter: Payer: Self-pay | Admitting: Cardiology

## 2018-08-10 ENCOUNTER — Other Ambulatory Visit: Payer: Self-pay | Admitting: *Deleted

## 2018-08-10 ENCOUNTER — Ambulatory Visit (INDEPENDENT_AMBULATORY_CARE_PROVIDER_SITE_OTHER): Payer: Medicare Other | Admitting: Cardiology

## 2018-08-10 VITALS — BP 90/60 | HR 70 | Ht 61.0 in | Wt 133.0 lb

## 2018-08-10 VITALS — BP 90/60 | HR 77 | Wt 133.0 lb

## 2018-08-10 DIAGNOSIS — I2510 Atherosclerotic heart disease of native coronary artery without angina pectoris: Secondary | ICD-10-CM | POA: Diagnosis not present

## 2018-08-10 DIAGNOSIS — Z95810 Presence of automatic (implantable) cardiac defibrillator: Secondary | ICD-10-CM | POA: Diagnosis not present

## 2018-08-10 DIAGNOSIS — I420 Dilated cardiomyopathy: Secondary | ICD-10-CM | POA: Diagnosis not present

## 2018-08-10 DIAGNOSIS — E785 Hyperlipidemia, unspecified: Secondary | ICD-10-CM

## 2018-08-10 DIAGNOSIS — G8929 Other chronic pain: Secondary | ICD-10-CM | POA: Diagnosis not present

## 2018-08-10 DIAGNOSIS — I10 Essential (primary) hypertension: Secondary | ICD-10-CM

## 2018-08-10 DIAGNOSIS — Z79899 Other long term (current) drug therapy: Secondary | ICD-10-CM | POA: Diagnosis not present

## 2018-08-10 DIAGNOSIS — I493 Ventricular premature depolarization: Secondary | ICD-10-CM | POA: Diagnosis not present

## 2018-08-10 DIAGNOSIS — I255 Ischemic cardiomyopathy: Secondary | ICD-10-CM | POA: Diagnosis not present

## 2018-08-10 DIAGNOSIS — M5136 Other intervertebral disc degeneration, lumbar region: Secondary | ICD-10-CM | POA: Diagnosis not present

## 2018-08-10 DIAGNOSIS — M542 Cervicalgia: Secondary | ICD-10-CM | POA: Diagnosis not present

## 2018-08-10 DIAGNOSIS — M545 Low back pain: Secondary | ICD-10-CM | POA: Diagnosis not present

## 2018-08-10 MED ORDER — NITROGLYCERIN 0.4 MG SL SUBL: 0.4000 mg | SUBLINGUAL_TABLET | SUBLINGUAL | 6 refills | Status: DC | PRN

## 2018-08-10 NOTE — Progress Notes (Signed)
Electrophysiology Office Note   Date:  08/10/2018   ID:  [name], DOB 1955-12-10, MRN [id number]  PCP:  Lillard Anes, MD  Cardiologist: Agustin Cree Primary Electrophysiologist:  Portia Wisdom Meredith Leeds, MD    No chief complaint on file.    History of Present Illness: [name] is a 62 y.o. female who is being seen today for the evaluation of ischemic cardiomyopathy at the request of [name]. Presenting today for electrophysiology evaluation.  She has a history of coronary artery disease status post MI, ischemic cardiomyopathy with a Medtronic dual-chamber ICD, PVCs, hypertension, and hyperlipidemia.    Today, she denies symptoms of palpitations, chest pain, shortness of breath, orthopnea, PND, lower extremity edema, claudication, dizziness, presyncope, syncope, bleeding, or neurologic sequela. The patient is tolerating medications without difficulties.  Her main symptoms are weakness and fatigue.  By about 7:00 in the evening she feels quite exhausted.  She has no chest pain or shortness of breath.  She is unaware of her PVCs.   Past Medical History:  Diagnosis Date  . AICD (automatic cardioverter/defibrillator) present 2012   . Arthritis   . Arthritis   . Asthma   . CHF (congestive heart failure) ([place])   . Chronic back pain   . COPD (chronic obstructive pulmonary disease) ([place])   . GERD (gastroesophageal reflux disease)   . Heart failure   . HLD (hyperlipidemia)   . Hypertension   . ICD (implantable cardiac defibrillator) in place 2012  . Left ventricular systolic dysfunction   . Myocardial infarction [facility])    2011 during intestinal blockage surgery  . NICM (nonischemic cardiomyopathy) ([place])   . PTSD (post-traumatic stress disorder)   . PVCs (premature ventricular contractions)   . Thyroid disease    Past Surgical History:  Procedure Laterality Date  . APPENDECTOMY    . CARDIAC CATHETERIZATION    . CARPAL TUNNEL RELEASE    . CESAREAN SECTION     . CHOLECYSTECTOMY    . CORONARY ANGIOPLASTY    . HAND SURGERY    . ICD IMPLANT     Medtronic  . intestinal blockage 2011    . LAPAROSCOPIC ASSISTED VENTRAL HERNIA REPAIR N/A 03/23/2015   Procedure: LAPAROSCOPIC VENTRAL WALL HERNIA REPAIR;  Surgeon: Michael Boston, MD;  Location: [facility];  Service: General;  Laterality: N/A;  With MESH  . LAPAROSCOPIC LYSIS OF ADHESIONS N/A 03/23/2015   Procedure: LAPAROSCOPIC LYSIS OF ADHESIONS;  Surgeon: Michael Boston, MD;  Location: [facility];  Service: General;  Laterality: N/A;  . NECK SURGERY     fused  . TONSILLECTOMY    . ULNAR NERVE TRANSPOSITION  01/23/2012   Procedure: ULNAR NERVE DECOMPRESSION/TRANSPOSITION;  Surgeon: Ophelia Charter, MD;  Location: [facility];  Service: Neurosurgery;  Laterality: Left;  LEFT ulnar nerve decompression     Current Outpatient Medications  Medication Sig Dispense Refill  . albuterol-ipratropium (COMBIVENT) 18-103 MCG/ACT inhaler Inhale 2 puffs into the lungs every 6 (six) hours as needed. For shortness of breath    . aspirin 81 MG tablet Take 81 mg by mouth daily.      Marland Kitchen BELBUCA 300 MCG FILM Place 1 patch under the tongue every 12 (twelve) hours.    . carvedilol (COREG) 3.125 MG tablet Take 1 tablet (3.125 mg total) by mouth 2 (two) times daily with a meal. 180 tablet 3  . celecoxib (CELEBREX) 200 MG capsule Take 1 capsule by mouth daily.  3  . diazepam (VALIUM) 5 MG  tablet Take 1 tablet by mouth every 6 (six) hours as needed for anxiety.   3  . ENTRESTO 24-26 MG TAKE 1 TABLET BY MOUTH TWICE DAILY 180 tablet 0  . esomeprazole (NEXIUM) 20 MG capsule Take 20 mg by mouth daily at 12 noon.    . fluticasone (FLONASE) 50 MCG/ACT nasal spray Place 1 spray into both nostrils daily.    . fluticasone furoate-vilanterol (BREO ELLIPTA) 100-25 MCG/INH AEPB Inhale 1 puff into the lungs daily.    . furosemide (LASIX) 20 MG tablet TAKE 2 TABLETS BY MOUTH EVERY MORNING AND 1 TABLET EVERY EVENING (Patient taking differently: TAKE 2  TABLETS BY MOUTH EVERY MORNING AND 2 TABLET EVERY EVENING) 90 tablet 0  . gabapentin (NEURONTIN) 300 MG capsule Take 1 capsule by mouth 3 (three) times daily.  2  . HYDROcodone-acetaminophen (NORCO) 10-325 MG per tablet Take 1-2 tablets by mouth every 4 (four) hours as needed for moderate pain or severe pain. (Patient taking differently: Take 1 tablet by mouth every 6 (six) hours as needed for moderate pain or severe pain. ) 40 tablet 0  . ipratropium (ATROVENT) 0.02 % nebulizer solution Take 0.5 mg by nebulization every 4 (four) hours as needed for wheezing or shortness of breath.    . levocetirizine (XYZAL) 5 MG tablet Take 5 mg by mouth every evening.    . linaclotide (LINZESS) 145 MCG CAPS capsule Take 1 capsule by mouth daily.    . nitroGLYCERIN (NITROSTAT) 0.4 MG SL tablet Place 0.4 mg under the tongue every 5 (five) minutes as needed for chest pain.    . ranolazine (RANEXA) 500 MG 12 hr tablet Take 500 mg by mouth 2 (two) times daily.    . rosuvastatin (CRESTOR) 10 MG tablet Take 10 mg by mouth daily.     No current facility-administered medications for this visit.     Allergies:   Patient has no known allergies.   Social History:  The patient  reports that she quit smoking about 8 years ago. Her smoking use included cigarettes. She has a 30.00 pack-year smoking history. She has never used smokeless tobacco. She reports that she does not drink alcohol or use drugs.   Family History:  The patient's family history includes Alcohol abuse in her father; Cancer in her unknown relative; Heart attack in her father, mother, and unknown relative; Heart failure in her unknown relative; Hypertension in her father and mother.    ROS:  Please see the history of present illness.   Otherwise, review of systems is positive for weakness, fatigue.   All other systems are reviewed and negative.    PHYSICAL EXAM: VS:  BP 90/60   Pulse 70   Ht 5\' 1"  (1.549 m)   Wt 133 lb (60.3 kg)   SpO2 97%   BMI  25.13 kg/m  , BMI Body mass index is 25.13 kg/m. GEN: Well nourished, well developed, in no acute distress  HEENT: normal  Neck: no JVD, carotid bruits, or masses Cardiac: iRRR; no murmurs, rubs, or gallops,no edema  Respiratory:  clear to auscultation bilaterally, normal work of breathing GI: soft, nontender, nondistended, + BS MS: no deformity or atrophy  Skin: warm and dry, device pocket is well healed Neuro:  Strength and sensation are intact Psych: euthymic mood, full affect  EKG:  EKG is ordered today. Personal review of the ekg ordered shows this rhythm, frequent PVCs  Device interrogation is reviewed today in detail.  See PaceArt for details.  Recent Labs: 06/23/2018: Hemoglobin 12.7; NT-Pro BNP 1,087; Platelets 458 07/03/2018: BUN 18; Creatinine, Ser 0.90; Potassium 4.6; Sodium 139    Lipid Panel     Component Value Date/Time   CHOL 170 06/30/2017 0000   TRIG 160 (H) 06/30/2017 0000   HDL 64 06/30/2017 0000   CHOLHDL 2.7 06/30/2017 0000   LDLCALC 74 06/30/2017 0000     Wt Readings from Last 3 Encounters:  08/10/18 133 lb (60.3 kg)  08/10/18 133 lb (60.3 kg)  06/23/18 129 lb 6.4 oz (58.7 kg)      Other studies Reviewed: Additional studies/ records that were reviewed today include: TTE 06/2017  Review of the above records today demonstrates:  - Left ventricle: The cavity size was normal. Systolic function was   moderately to severely reduced. The estimated ejection fraction   was in the range of 30% to 35%. Hypokinesis of the inferoseptal   myocardium. Doppler parameters are consistent with abnormal left   ventricular relaxation (grade 1 diastolic dysfunction). - Aortic valve: Valve area (VTI): 1.95 cm^2. Valve area (Vmax):   1.85 cm^2. Valve area (Vmean): 1.98 cm^2. - Mitral valve: There was mild regurgitation. - Left atrium: The atrium was mildly dilated.   ASSESSMENT AND PLAN:  1.  Ischemic cardiomyopathy: Currently on optimal medical therapy  status post Medtronic dual-chamber ICD.  On optimal medical therapy.  2.  Hypertension: Blood pressure low today.  No changes.  3.  Hyperlipidemia: Statin per primary care  4.  Coronary artery disease: Feeling well without chest pain.  5.  PVCs: Has a high burden on her EKG today.  Unfortunately her device is unable to quantify.  Sahej Hauswirth order a 48-hour monitor.  This may be the cause of some of her weakness and fatigue.  Current medicines are reviewed at length with the patient today.   The patient does not have concerns regarding her medicines.  The following changes were made today:  none  Labs/ tests ordered today include:  Orders Placed This Encounter  Procedures  . Holter monitor - 48 hour  . EKG 12-Lead   Case discussed with primary cardiology  Disposition:   FU with Chaeli Judy 1 year  Signed, Aileana Hodder Meredith Leeds, MD  08/10/2018 11:07 AM     [facility] [address] [place] [address] [phone] (office) [phone] (fax)

## 2018-08-10 NOTE — Progress Notes (Signed)
Cardiology Office Note:    Date:  08/10/2018   ID:  [name], DOB 01/14/1956, MRN [id number]  PCP:  Lillard Anes, MD  Cardiologist:  Jenne Campus, MD    Referring MD: Lillard Anes,*   Chief Complaint  Patient presents with  . 1 month follow up  Doing well  History of Present Illness:    [name] is a 62 y.o. female with cardiomyopathy she was scheduled to have echocardiogram for some reason it did not happen I will schedule her to have another echocardiogram to recheck left ventricular ejection fraction.  Hemodynamically she is compensated but complained of having weakness fatigue and tiredness while walking.  No discharges from the defibrillator.  Past Medical History:  Diagnosis Date  . AICD (automatic cardioverter/defibrillator) present 2012   . Arthritis   . Arthritis   . Asthma   . CHF (congestive heart failure) ([place])   . Chronic back pain   . COPD (chronic obstructive pulmonary disease) ([place])   . GERD (gastroesophageal reflux disease)   . Heart failure   . HLD (hyperlipidemia)   . Hypertension   . ICD (implantable cardiac defibrillator) in place 2012  . Left ventricular systolic dysfunction   . Myocardial infarction [facility])    2011 during intestinal blockage surgery  . NICM (nonischemic cardiomyopathy) ([place])   . PTSD (post-traumatic stress disorder)   . PVCs (premature ventricular contractions)   . Thyroid disease     Past Surgical History:  Procedure Laterality Date  . APPENDECTOMY    . CARDIAC CATHETERIZATION    . CARPAL TUNNEL RELEASE    . CESAREAN SECTION    . CHOLECYSTECTOMY    . CORONARY ANGIOPLASTY    . HAND SURGERY    . ICD IMPLANT     Medtronic  . intestinal blockage 2011    . LAPAROSCOPIC ASSISTED VENTRAL HERNIA REPAIR N/A 03/23/2015   Procedure: LAPAROSCOPIC VENTRAL WALL HERNIA REPAIR;  Surgeon: Michael Boston, MD;  [facility];  Service: General;  Laterality: N/A;  With MESH  . LAPAROSCOPIC LYSIS OF  ADHESIONS N/A 03/23/2015   Procedure: LAPAROSCOPIC LYSIS OF ADHESIONS;  Surgeon: Michael Boston, MD;  [facility];  Service: General;  Laterality: N/A;  . NECK SURGERY     fused  . TONSILLECTOMY    . ULNAR NERVE TRANSPOSITION  01/23/2012   Procedure: ULNAR NERVE DECOMPRESSION/TRANSPOSITION;  Surgeon: Ophelia Charter, MD;  Location: [place] [facility] OR[facility];  Service: Neurosurgery;  Laterality: Left;  LEFT ulnar nerve decompression    Current Medications: Current Meds  Medication Sig  . albuterol-ipratropium (COMBIVENT) 18-103 MCG/ACT inhaler Inhale 2 puffs into the lungs every 6 (six) hours as needed. For shortness of breath  . aspirin 81 MG tablet Take 81 mg by mouth daily.    Marland Kitchen BELBUCA 300 MCG FILM Place 1 patch under the tongue every 12 (twelve) hours.  . carvedilol (COREG) 3.125 MG tablet Take 1 tablet (3.125 mg total) by mouth 2 (two) times daily with a meal.  . celecoxib (CELEBREX) 200 MG capsule Take 1 capsule by mouth daily.  . diazepam (VALIUM) 5 MG tablet Take 1 tablet by mouth every 6 (six) hours as needed for anxiety.   Marland Kitchen ENTRESTO 24-26 MG TAKE 1 TABLET BY MOUTH TWICE DAILY  . esomeprazole (NEXIUM) 20 MG capsule Take 20 mg by mouth daily at 12 noon.  . fluticasone (FLONASE) 50 MCG/ACT nasal spray Place 1 spray into both nostrils daily.  . fluticasone furoate-vilanterol (BREO ELLIPTA)  100-25 MCG/INH AEPB Inhale 1 puff into the lungs daily.  . furosemide (LASIX) 20 MG tablet TAKE 2 TABLETS BY MOUTH EVERY MORNING AND 1 TABLET EVERY EVENING (Patient taking differently: TAKE 2 TABLETS BY MOUTH EVERY MORNING AND 2 TABLET EVERY EVENING)  . gabapentin (NEURONTIN) 300 MG capsule Take 1 capsule by mouth 3 (three) times daily.  Marland Kitchen HYDROcodone-acetaminophen (NORCO) 10-325 MG per tablet Take 1-2 tablets by mouth every 4 (four) hours as needed for moderate pain or severe pain. (Patient taking differently: Take 1 tablet by mouth every 6 (six) hours as needed for moderate pain or severe pain. )  .  ipratropium (ATROVENT) 0.02 % nebulizer solution Take 0.5 mg by nebulization every 4 (four) hours as needed for wheezing or shortness of breath.  . levocetirizine (XYZAL) 5 MG tablet Take 5 mg by mouth every evening.  . linaclotide (LINZESS) 145 MCG CAPS capsule Take 1 capsule by mouth daily.  . nitroGLYCERIN (NITROSTAT) 0.4 MG SL tablet Place 0.4 mg under the tongue every 5 (five) minutes as needed for chest pain.  . ranolazine (RANEXA) 500 MG 12 hr tablet Take 500 mg by mouth 2 (two) times daily.  . rosuvastatin (CRESTOR) 10 MG tablet Take 10 mg by mouth daily.     Allergies:   Patient has no known allergies.   Social History   Socioeconomic History  . Marital status: Single    Spouse name: Not on file  . Number of children: 3  . Years of education: Not on file  . Highest education level: Not on file  Occupational History  . Occupation: disabled  Social Needs  . Financial resource strain: Not on file  . Food insecurity:    Worry: Not on file    Inability: Not on file  . Transportation needs:    Medical: Not on file    Non-medical: Not on file  Tobacco Use  . Smoking status: Former Smoker    Packs/day: 1.00    Years: 30.00    Pack years: 30.00    Types: Cigarettes    Last attempt to quit: 08/11/2010    Years since quitting: 8.0  . Smokeless tobacco: Never Used  Substance and Sexual Activity  . Alcohol use: No  . Drug use: No  . Sexual activity: Not Currently  Lifestyle  . Physical activity:    Days per week: Not on file    Minutes per session: Not on file  . Stress: Not on file  Relationships  . Social connections:    Talks on phone: Not on file    Gets together: Not on file    Attends religious service: Not on file    Active member of club or organization: Not on file    Attends meetings of clubs or organizations: Not on file    Relationship status: Not on file  Other Topics Concern  . Not on file  Social History Narrative  . Not on file     Family  History: The patient's family history includes Alcohol abuse in her father; Cancer in her unknown relative; Heart attack in her father, mother, and unknown relative; Heart failure in her unknown relative; Hypertension in her father and mother. There is no history of Anesthesia problems, Hypotension, Malignant hyperthermia, or Pseudochol deficiency. ROS:   Please see the history of present illness.    All 14 point review of systems negative except as described per history of present illness  EKGs/Labs/Other Studies Reviewed:  Recent Labs: 06/23/2018: Hemoglobin 12.7; NT-Pro BNP 1,087; Platelets 458 07/03/2018: BUN 18; Creatinine, Ser 0.90; Potassium 4.6; Sodium 139  Recent Lipid Panel    Component Value Date/Time   CHOL 170 06/30/2017 0000   TRIG 160 (H) 06/30/2017 0000   HDL 64 06/30/2017 0000   CHOLHDL 2.7 06/30/2017 0000   LDLCALC 74 06/30/2017 0000    Physical Exam:    VS:  BP 90/60   Pulse 77   Wt 133 lb (60.3 kg)   SpO2 97%   BMI 25.13 kg/m     Wt Readings from Last 3 Encounters:  08/10/18 133 lb (60.3 kg)  06/23/18 129 lb 6.4 oz (58.7 kg)  02/27/18 135 lb (61.2 kg)     GEN:  Well nourished, well developed in no acute distress HEENT: Normal NECK: No JVD; No carotid bruits LYMPHATICS: No lymphadenopathy CARDIAC: RRR, no murmurs, no rubs, no gallops RESPIRATORY:  Clear to auscultation without rales, wheezing or rhonchi  ABDOMEN: Soft, non-tender, non-distended MUSCULOSKELETAL:  No edema; No deformity  SKIN: Warm and dry LOWER EXTREMITIES: no swelling NEUROLOGIC:  Alert and oriented x 3 PSYCHIATRIC:  Normal affect   ASSESSMENT:    1. Dilated cardiomyopathy ([place])   2. Coronary artery disease involving native coronary artery of native heart without angina pectoris   3. 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   4. Dyslipidemia    PLAN:    In order of problems listed above:  1. Dilated cardiomyopathy on appropriate medication she is  able to tolerate.  The biggest obstacle is blood pressure being low she is on Entresto small dose on beta-blocker small dose which I will continue. 2. Coronary artery disease stable on appropriate medications which I will continue. 3. Dual ICD present.  Will be interrogated today.   Medication Adjustments/Labs and Tests Ordered: Current medicines are reviewed at length with the patient today.  Concerns regarding medicines are outlined above.  No orders of the defined types were placed in this encounter.  Medication changes: No orders of the defined types were placed in this encounter.   Signed, Park Liter, MD, [facility] 08/10/2018 10:38 AM    [place]

## 2018-08-10 NOTE — Patient Instructions (Addendum)
Medication Instructions:  Your physician recommends that you continue on your current medications as directed. Please refer to the Current Medication list given to you today.  *If you need a refill on your cardiac medications before your next appointment, please call your pharmacy*  Labwork: None ordered  Testing/Procedures: Your physician has recommended that you wear a 48 hour holter monitor. Holter monitors are medical devices that record the heart's electrical activity. Doctors most often use these monitors to diagnose arrhythmias. Arrhythmias are problems with the speed or rhythm of the heartbeat. The monitor is a small, portable device. You can wear one while you do your normal daily activities. This is usually used to diagnose what is causing palpitations/syncope (passing out).  Follow-Up: Remote monitoring is used to monitor your Pacemaker or ICD from home. This monitoring reduces the number of office visits required to check your device to one time per year. It allows [place] to keep an eye on the functioning of your device to ensure it is working properly. You are scheduled for a device check from home on 11/09/2018. You may send your transmission at any time that day. If you have a wireless device, the transmission will be sent automatically. After your physician reviews your transmission, you will receive a postcard with your next transmission date.  Your physician wants you to follow-up in: 1 year with Dr. Curt Bears.  You will receive a reminder letter in the mail two months in advance. If you don't receive a letter, please call our office to schedule the follow-up appointment.  Thank you for choosing [facility]!!   Trinidad Curet, RN [phone]

## 2018-08-10 NOTE — Patient Instructions (Signed)
Medication Instructions:  Your physician recommends that you continue on your current medications as directed. Please refer to the Current Medication list given to you today.  If you need a refill on your cardiac medications before your next appointment, please call your pharmacy.   Lab work: None  If you have labs (blood work) drawn today and your tests are completely normal, you will receive your results only by: Marland Kitchen MyChart Message (if you have MyChart) OR . A paper copy in the mail If you have any lab test that is abnormal or we need to change your treatment, we will call you to review the results.  Testing/Procedures: Your physician has requested that you have an echocardiogram. Echocardiography is a painless test that uses sound waves to create images of your heart. It provides your doctor with information about the size and shape of your heart and how well your heart's chambers and valves are working. This procedure takes approximately one hour. There are no restrictions for this procedure.  Follow-Up: At [facility], you and your health needs are our priority.  As part of our continuing mission to provide you with exceptional heart care, we have created designated Provider Care Teams.  These Care Teams include your primary Cardiologist (physician) and Advanced Practice Providers (APPs -  Physician Assistants and Nurse Practitioners) who all work together to provide you with the care you need, when you need it.  You will need a follow up appointment in 3 months.  Please call our office 2 months in advance to schedule this appointment.  You may see another member of our [employer] Provider Team in [place]: Shirlee More, MD . Jyl Heinz, MD  Any Other Special Instructions Will Be Listed Below (If Applicable).

## 2018-08-11 DIAGNOSIS — J449 Chronic obstructive pulmonary disease, unspecified: Secondary | ICD-10-CM | POA: Diagnosis not present

## 2018-08-14 DIAGNOSIS — I5020 Unspecified systolic (congestive) heart failure: Secondary | ICD-10-CM | POA: Diagnosis not present

## 2018-08-25 DIAGNOSIS — Z0000 Encounter for general adult medical examination without abnormal findings: Secondary | ICD-10-CM | POA: Diagnosis not present

## 2018-09-08 ENCOUNTER — Other Ambulatory Visit: Payer: Self-pay | Admitting: Cardiology

## 2018-09-09 DIAGNOSIS — J441 Chronic obstructive pulmonary disease with (acute) exacerbation: Secondary | ICD-10-CM | POA: Diagnosis not present

## 2018-09-10 DIAGNOSIS — M545 Low back pain: Secondary | ICD-10-CM | POA: Diagnosis not present

## 2018-09-10 DIAGNOSIS — M419 Scoliosis, unspecified: Secondary | ICD-10-CM | POA: Diagnosis not present

## 2018-09-10 DIAGNOSIS — Z79899 Other long term (current) drug therapy: Secondary | ICD-10-CM | POA: Diagnosis not present

## 2018-09-10 DIAGNOSIS — G8929 Other chronic pain: Secondary | ICD-10-CM | POA: Diagnosis not present

## 2018-09-10 DIAGNOSIS — M5136 Other intervertebral disc degeneration, lumbar region: Secondary | ICD-10-CM | POA: Diagnosis not present

## 2018-09-11 DIAGNOSIS — J449 Chronic obstructive pulmonary disease, unspecified: Secondary | ICD-10-CM | POA: Diagnosis not present

## 2018-09-15 DIAGNOSIS — I5020 Unspecified systolic (congestive) heart failure: Secondary | ICD-10-CM | POA: Diagnosis not present

## 2018-09-22 DIAGNOSIS — Z1231 Encounter for screening mammogram for malignant neoplasm of breast: Secondary | ICD-10-CM | POA: Diagnosis not present

## 2018-09-22 LAB — HM MAMMOGRAPHY

## 2018-09-30 ENCOUNTER — Ambulatory Visit (INDEPENDENT_AMBULATORY_CARE_PROVIDER_SITE_OTHER): Payer: Medicare Other

## 2018-09-30 DIAGNOSIS — I420 Dilated cardiomyopathy: Secondary | ICD-10-CM | POA: Diagnosis not present

## 2018-09-30 DIAGNOSIS — I493 Ventricular premature depolarization: Secondary | ICD-10-CM | POA: Diagnosis not present

## 2018-09-30 NOTE — Progress Notes (Signed)
Complete echocardiogram has been performed.  Jimmy Tsering Leaman RDCS, RVT 

## 2018-10-01 ENCOUNTER — Other Ambulatory Visit: Payer: Self-pay | Admitting: Cardiology

## 2018-10-08 DIAGNOSIS — M1712 Unilateral primary osteoarthritis, left knee: Secondary | ICD-10-CM | POA: Diagnosis not present

## 2018-10-08 DIAGNOSIS — M1711 Unilateral primary osteoarthritis, right knee: Secondary | ICD-10-CM | POA: Diagnosis not present

## 2018-10-09 DIAGNOSIS — M545 Low back pain: Secondary | ICD-10-CM | POA: Diagnosis not present

## 2018-10-09 DIAGNOSIS — M546 Pain in thoracic spine: Secondary | ICD-10-CM | POA: Diagnosis not present

## 2018-10-09 DIAGNOSIS — G8929 Other chronic pain: Secondary | ICD-10-CM | POA: Diagnosis not present

## 2018-10-09 DIAGNOSIS — Z79899 Other long term (current) drug therapy: Secondary | ICD-10-CM | POA: Diagnosis not present

## 2018-10-09 DIAGNOSIS — M5136 Other intervertebral disc degeneration, lumbar region: Secondary | ICD-10-CM | POA: Diagnosis not present

## 2018-10-12 DIAGNOSIS — J449 Chronic obstructive pulmonary disease, unspecified: Secondary | ICD-10-CM | POA: Diagnosis not present

## 2018-10-23 DIAGNOSIS — E782 Mixed hyperlipidemia: Secondary | ICD-10-CM | POA: Diagnosis not present

## 2018-10-23 DIAGNOSIS — G43001 Migraine without aura, not intractable, with status migrainosus: Secondary | ICD-10-CM | POA: Diagnosis not present

## 2018-10-23 DIAGNOSIS — I10 Essential (primary) hypertension: Secondary | ICD-10-CM | POA: Diagnosis not present

## 2018-10-23 DIAGNOSIS — I110 Hypertensive heart disease with heart failure: Secondary | ICD-10-CM | POA: Diagnosis not present

## 2018-10-27 DIAGNOSIS — J449 Chronic obstructive pulmonary disease, unspecified: Secondary | ICD-10-CM | POA: Diagnosis not present

## 2018-11-02 DIAGNOSIS — G43909 Migraine, unspecified, not intractable, without status migrainosus: Secondary | ICD-10-CM | POA: Diagnosis not present

## 2018-11-02 DIAGNOSIS — G43001 Migraine without aura, not intractable, with status migrainosus: Secondary | ICD-10-CM | POA: Diagnosis not present

## 2018-11-05 DIAGNOSIS — M546 Pain in thoracic spine: Secondary | ICD-10-CM | POA: Diagnosis not present

## 2018-11-05 DIAGNOSIS — M545 Low back pain: Secondary | ICD-10-CM | POA: Diagnosis not present

## 2018-11-05 DIAGNOSIS — G8929 Other chronic pain: Secondary | ICD-10-CM | POA: Diagnosis not present

## 2018-11-05 DIAGNOSIS — Z79899 Other long term (current) drug therapy: Secondary | ICD-10-CM | POA: Diagnosis not present

## 2018-11-05 DIAGNOSIS — M5136 Other intervertebral disc degeneration, lumbar region: Secondary | ICD-10-CM | POA: Diagnosis not present

## 2018-11-09 ENCOUNTER — Ambulatory Visit (INDEPENDENT_AMBULATORY_CARE_PROVIDER_SITE_OTHER): Payer: Medicare Other | Admitting: *Deleted

## 2018-11-09 ENCOUNTER — Other Ambulatory Visit: Payer: Self-pay

## 2018-11-09 DIAGNOSIS — I255 Ischemic cardiomyopathy: Secondary | ICD-10-CM

## 2018-11-09 DIAGNOSIS — I5022 Chronic systolic (congestive) heart failure: Secondary | ICD-10-CM

## 2018-11-10 ENCOUNTER — Telehealth: Payer: Self-pay

## 2018-11-10 ENCOUNTER — Other Ambulatory Visit: Payer: Self-pay

## 2018-11-10 DIAGNOSIS — J449 Chronic obstructive pulmonary disease, unspecified: Secondary | ICD-10-CM | POA: Diagnosis not present

## 2018-11-10 LAB — CUP PACEART REMOTE DEVICE CHECK
Battery Voltage: 2.61 V
Brady Statistic AP VP Percent: 0.02 %
Brady Statistic AP VS Percent: 25.53 %
Brady Statistic AS VP Percent: 0.03 %
Brady Statistic AS VS Percent: 74.42 %
Brady Statistic RA Percent Paced: 25.16 %
Brady Statistic RV Percent Paced: 0.06 %
Date Time Interrogation Session: 20200317131652
HighPow Impedance: 589 Ohm
HighPow Impedance: 72 Ohm
Implantable Lead Implant Date: 20120502
Implantable Lead Implant Date: 20120502
Implantable Lead Location: 753859
Implantable Lead Location: 753860
Implantable Lead Model: 4076
Implantable Lead Model: 7122
Implantable Pulse Generator Implant Date: 20120502
Lead Channel Impedance Value: 399 Ohm
Lead Channel Impedance Value: 703 Ohm
Lead Channel Pacing Threshold Amplitude: 0.625 V
Lead Channel Pacing Threshold Amplitude: 0.875 V
Lead Channel Pacing Threshold Pulse Width: 0.4 ms
Lead Channel Pacing Threshold Pulse Width: 0.4 ms
Lead Channel Sensing Intrinsic Amplitude: 2.875 mV
Lead Channel Sensing Intrinsic Amplitude: 2.875 mV
Lead Channel Sensing Intrinsic Amplitude: 9 mV
Lead Channel Sensing Intrinsic Amplitude: 9 mV
Lead Channel Setting Pacing Amplitude: 1.5 V
Lead Channel Setting Pacing Amplitude: 2 V
Lead Channel Setting Pacing Pulse Width: 0.4 ms
Lead Channel Setting Sensing Sensitivity: 0.3 mV

## 2018-11-10 MED ORDER — SACUBITRIL-VALSARTAN 24-26 MG PO TABS: 1.0000 | ORAL_TABLET | Freq: Two times a day (BID) | ORAL | 1 refills | Status: DC

## 2018-11-10 NOTE — Telephone Encounter (Signed)
Spoke with patient to remind of missed remote transmission 

## 2018-11-11 ENCOUNTER — Telehealth: Payer: Self-pay

## 2018-11-11 NOTE — Telephone Encounter (Signed)
Tried to contact patient in regards to patient's office visit for tomorrow morning. No one answered from either listed phone number. Will try to contact patient prior to office visit.

## 2018-11-12 ENCOUNTER — Telehealth: Payer: Self-pay | Admitting: Cardiology

## 2018-11-12 ENCOUNTER — Ambulatory Visit: Payer: Medicare Other | Admitting: Cardiology

## 2018-11-12 ENCOUNTER — Telehealth: Payer: Self-pay | Admitting: *Deleted

## 2018-11-12 NOTE — Telephone Encounter (Signed)
Left message for patient to return call.

## 2018-11-12 NOTE — Telephone Encounter (Signed)
ICD battery voltage 2.61V as of most recent remote check on 11/10/18, RRT at 2.63V. Monitor is transmitting automatically. Will advise patient to call our office when she hears the alert tone.  LMOM requesting call back to DC. Gave direct number for return call.

## 2018-11-12 NOTE — Progress Notes (Deleted)
  Primary Cardiologist:  No primary care provider on file.    Patient contacted. History reviewed. No symptoms to suggest any unstable cardiac conditions. Based on discussion, with current pandemic situation, we will be postponing this appointment for [name].  If symptoms change, she has been instructed to contact our office.    Routing to C19 CANCEL pool for tracking (P CV DIV CV19 CANCEL).    Jenne Campus, MD  11/12/2018 8:50 AM

## 2018-11-13 DIAGNOSIS — J441 Chronic obstructive pulmonary disease with (acute) exacerbation: Secondary | ICD-10-CM | POA: Diagnosis not present

## 2018-11-13 DIAGNOSIS — D72828 Other elevated white blood cell count: Secondary | ICD-10-CM | POA: Diagnosis not present

## 2018-11-13 DIAGNOSIS — G43001 Migraine without aura, not intractable, with status migrainosus: Secondary | ICD-10-CM | POA: Diagnosis not present

## 2018-11-16 ENCOUNTER — Telehealth: Payer: Self-pay | Admitting: *Deleted

## 2018-11-16 NOTE — Telephone Encounter (Signed)
Spoke w/ pt and informed her of Device Tech RN recommendations. Pt verbalized understanding.

## 2018-11-16 NOTE — Telephone Encounter (Signed)
Device clinic called [name] and let her know her battery is low. [name] states Dr. Raliegh Ip had people check her battery and that it is fine. [name] not sure what she should do, thought Dr. Raliegh Ip would change batteries if she needed this done. Please advise.

## 2018-11-16 NOTE — Progress Notes (Signed)
Remote ICD transmission.   

## 2018-11-16 NOTE — Telephone Encounter (Signed)
Spoke with patient. Clarified that Dr. Agustin Cree is her primary cardiologist and Dr. Curt Bears is her EP. Advised if she hears device alert tone, to call the DC directly. Pt verbalizes understanding and thanked me for my call.

## 2018-11-27 DIAGNOSIS — M1711 Unilateral primary osteoarthritis, right knee: Secondary | ICD-10-CM | POA: Diagnosis not present

## 2018-11-30 DIAGNOSIS — M25562 Pain in left knee: Secondary | ICD-10-CM | POA: Diagnosis not present

## 2018-11-30 DIAGNOSIS — M25561 Pain in right knee: Secondary | ICD-10-CM | POA: Diagnosis not present

## 2018-11-30 DIAGNOSIS — M5416 Radiculopathy, lumbar region: Secondary | ICD-10-CM | POA: Diagnosis not present

## 2018-11-30 DIAGNOSIS — M792 Neuralgia and neuritis, unspecified: Secondary | ICD-10-CM | POA: Diagnosis not present

## 2018-11-30 DIAGNOSIS — Z9889 Other specified postprocedural states: Secondary | ICD-10-CM | POA: Diagnosis not present

## 2018-12-03 DIAGNOSIS — D72829 Elevated white blood cell count, unspecified: Secondary | ICD-10-CM | POA: Diagnosis not present

## 2018-12-03 DIAGNOSIS — M545 Low back pain: Secondary | ICD-10-CM | POA: Diagnosis not present

## 2018-12-03 DIAGNOSIS — G8929 Other chronic pain: Secondary | ICD-10-CM | POA: Diagnosis not present

## 2018-12-03 DIAGNOSIS — K089 Disorder of teeth and supporting structures, unspecified: Secondary | ICD-10-CM | POA: Diagnosis not present

## 2018-12-03 DIAGNOSIS — M5136 Other intervertebral disc degeneration, lumbar region: Secondary | ICD-10-CM | POA: Diagnosis not present

## 2018-12-08 ENCOUNTER — Encounter: Payer: Self-pay | Admitting: Neurology

## 2018-12-11 DIAGNOSIS — J449 Chronic obstructive pulmonary disease, unspecified: Secondary | ICD-10-CM | POA: Diagnosis not present

## 2018-12-14 ENCOUNTER — Telehealth: Payer: Self-pay | Admitting: Cardiology

## 2018-12-14 NOTE — Telephone Encounter (Signed)
Virtual Visit Pre-Appointment Phone Call  Steps For Call:  1. Confirm consent - "In the setting of the current Covid19 crisis, you are scheduled for a (phone or video) visit with your provider on (date) at (time).  Just as we do with many in-office visits, in order for you to participate in this visit, we must obtain consent.  If you'd like, I can send this to your mychart (if signed up) or email for you to review.  Otherwise, I can obtain your verbal consent now.  All virtual visits are billed to your insurance company just like a normal visit would be.  By agreeing to a virtual visit, we'd like you to understand that the technology does not allow for your provider to perform an examination, and thus may limit your provider's ability to fully assess your condition. If your provider identifies any concerns that need to be evaluated in person, we will make arrangements to do so.  Finally, though the technology is pretty good, we cannot assure that it will always work on either your or our end, and in the setting of a video visit, we may have to convert it to a phone-only visit.  In either situation, we cannot ensure that we have a secure connection.  Are you willing to proceed?" STAFF: Did the patient verbally acknowledge consent to telehealth visit? Document YES/NO here: YES  2. Confirm the BEST phone number to call the day of the visit by including in appointment notes  3. Give patient instructions for MyChart download to smartphone OR [email or web address] as below if video visit (depending on what platform provider is using)  4. Confirm that appointment type is correct in Epic appointment notes (VIDEO vs PHONE)  5. Advise patient to be prepared with their blood pressure, heart rate, weight, any heart rhythm information, their current medicines, and a piece of paper and pen handy for any instructions they may receive the day of their visit  6. Inform patient they will receive a phone call 15 minutes  prior to their appointment time (may be from unknown caller ID) so they should be prepared to answer    TELEPHONE CALL NOTE  [name] has been deemed a candidate for a follow-up tele-health visit to limit community exposure during the Covid-19 pandemic. I spoke with the patient via phone to ensure availability of phone/video source, confirm preferred email & phone number, and discuss instructions and expectations.  I reminded [name] to be prepared with any vital sign and/or heart rhythm information that could potentially be obtained via home monitoring, at the time of her visit. I reminded [name] to expect a phone call prior to her visit.  Isaiah Blakes 12/14/2018 3:50 PM   INSTRUCTIONS FOR DOWNLOADING THE MYCHART APP TO SMARTPHONE  - The patient must first make sure to have activated MyChart and know their login information - If Apple, go to [employer] and type in MyChart in the search bar and download the app. If Android, ask patient to go to [employer] and type in [place] in the search bar and download the app. The app is free but as with any other app downloads, their phone may require them to verify saved payment information or Apple/Android password.  - The patient will need to then log into the app with their MyChart username and password, and select [facility] as their healthcare provider to link the account. When it is time for your visit,  go to the MyChart app, find appointments, and click Begin Video Visit. Be sure to Select Allow for your device to access the Microphone and Camera for your visit. You will then be connected, and your provider will be with you shortly.  **If they have any issues connecting, or need assistance please contact MyChart service desk [phone])**  **If using a computer, in order to ensure the best quality for their visit they will need to use either of the following Internet Browsers: [employer], or  Google Chrome**  IF USING DOXIMITY or [email or web address].ME - The patient will receive a link just prior to their visit by text.     FULL LENGTH CONSENT FOR TELE-HEALTH VISIT   I hereby voluntarily request, consent and authorize [facility] and its employed or contracted physicians, physician assistants, nurse practitioners or other licensed health care professionals (the Practitioner), to provide me with telemedicine health care services (the Services") as deemed necessary by the treating Practitioner. I acknowledge and consent to receive the Services by the Practitioner via telemedicine. I understand that the telemedicine visit will involve communicating with the Practitioner through live audiovisual communication technology and the disclosure of certain medical information by electronic transmission. I acknowledge that I have been given the opportunity to request an in-person assessment or other available alternative prior to the telemedicine visit and am voluntarily participating in the telemedicine visit.  I understand that I have the right to withhold or withdraw my consent to the use of telemedicine in the course of my care at any time, without affecting my right to future care or treatment, and that the Practitioner or I may terminate the telemedicine visit at any time. I understand that I have the right to inspect all information obtained and/or recorded in the course of the telemedicine visit and may receive copies of available information for a reasonable fee.  I understand that some of the potential risks of receiving the Services via telemedicine include:   Delay or interruption in medical evaluation due to technological equipment failure or disruption;  Information transmitted may not be sufficient (e.g. poor resolution of images) to allow for appropriate medical decision making by the Practitioner; and/or   In rare instances, security protocols could fail, causing a breach of personal health  information.  Furthermore, I acknowledge that it is my responsibility to provide information about my medical history, conditions and care that is complete and accurate to the best of my ability. I acknowledge that Practitioner's advice, recommendations, and/or decision may be based on factors not within their control, such as incomplete or inaccurate data provided by me or distortions of diagnostic images or specimens that may result from electronic transmissions. I understand that the practice of medicine is not an exact science and that Practitioner makes no warranties or guarantees regarding treatment outcomes. I acknowledge that I will receive a copy of this consent concurrently upon execution via email to the email address I last provided but may also request a printed copy by calling the office of [facility].    I understand that my insurance will be billed for this visit.   I have read or had this consent read to me.  I understand the contents of this consent, which adequately explains the benefits and risks of the Services being provided via telemedicine.   I have been provided ample opportunity to ask questions regarding this consent and the Services and have had my questions answered to my satisfaction.  I give my informed  consent for the services to be provided through the use of telemedicine in my medical care  By participating in this telemedicine visit I agree to the above.

## 2018-12-21 ENCOUNTER — Telehealth: Payer: Self-pay | Admitting: *Deleted

## 2018-12-21 DIAGNOSIS — G8929 Other chronic pain: Secondary | ICD-10-CM

## 2018-12-21 HISTORY — DX: Other chronic pain: G89.29

## 2018-12-21 NOTE — Telephone Encounter (Signed)
Went over consent form with pt and did screening questions. Pt knows about visit and gave consent and will have wt and bp when we call.  Cardiac Questionnaire:    Since your last visit or hospitalization:    1. Have you been having new or worsening chest pain? no   2. Have you been having new or worsening shortness of breath? no 3. Have you been having new or worsening leg swelling, wt gain, or increase in abdominal girth (pants fitting more tightly)? no   4. Have you had any passing out spells? no    *A YES to any of these questions would result in the appointment being kept. *If all the answers to these questions are NO, we should indicate that given the current situation regarding the worldwide coronarvirus pandemic, at the recommendation of the CDC, we are looking to limit gatherings in our waiting area, and thus will reschedule their appointment beyond four weeks from today.   _____________   [id number] Pre-Screening Questions:  . Do you currently have a fever? no (yes = cancel and refer to pcp for e-visit) . Have you recently travelled on a cruise, internationally, or to [place], [place], [place], [place], [place], or [place], [place] [employer]) ? no (yes = cancel, stay home, monitor symptoms, and contact pcp or initiate e-visit if symptoms develop) . Have you been in contact with someone that is currently pending confirmation of Covid19 testing or has been confirmed to have the [place] virus?  no (yes = cancel, stay home, away from tested individual, monitor symptoms, and contact pcp or initiate e-visit if symptoms develop) . Are you currently experiencing fatigue or cough? no (yes = pt should be prepared to have a mask placed at the time of their visit).         YOUR CARDIOLOGY TEAM HAS ARRANGED FOR AN E-VISIT FOR YOUR APPOINTMENT - PLEASE REVIEW IMPORTANT INFORMATION BELOW SEVERAL DAYS PRIOR TO YOUR APPOINTMENT  Due to the recent COVID-19 pandemic, we are transitioning in-person office visits to  tele-medicine visits in an effort to decrease unnecessary exposure to our patients, their families, and staff. These visits are billed to your insurance just like a normal visit is. We also encourage you to sign up for MyChart if you have not already done so. You will need a smartphone if possible. For patients that do not have this, we can still complete the visit using a regular telephone but do prefer a smartphone to enable video when possible. You may have a family member that lives with you that can help. If possible, we also ask that you have a blood pressure cuff and scale at home to measure your blood pressure, heart rate and weight prior to your scheduled appointment. Patients with clinical needs that need an in-person evaluation and testing will still be able to come to the office if absolutely necessary. If you have any questions, feel free to call our office.     YOUR PROVIDER WILL BE USING THE FOLLOWING PLATFORM TO COMPLETE YOUR VISIT: Staff: Please delete this text and fill in MyChart/Doximity/Doxy.Me  . IF USING MYCHART - How to Download the MyChart App to Your SmartPhone   - If Apple, go to [employer] and type in MyChart in the search bar and download the app. If Android, ask patient to go to [employer] and type in [place] in the search bar and download the app. The app is free but as with any other app downloads, your  phone may require you to verify saved payment information or Apple/Android password.  - You will need to then log into the app with your MyChart username and password, and select [facility] as your healthcare provider to link the account.  - When it is time for your visit, go to the MyChart app, find appointments, and click Begin Video Visit. Be sure to Select Allow for your device to access the Microphone and Camera for your visit. You will then be connected, and your provider will be with you shortly.  **If you have any issues connecting or need assistance, please  contact MyChart service desk [phone])**  **If using a computer, in order to ensure the best quality for your visit, you will need to use either of the following Internet Browsers: Insurance underwriter or [employer]**  . IF USING DOXIMITY or DOXY.ME - The staff will give you instructions on receiving your link to join the meeting the day of your visit.      2-3 DAYS BEFORE YOUR APPOINTMENT  You will receive a telephone call from one of our [facility] team members - your caller ID may say "Unknown caller." If this is a video visit, we will walk you through how to get the video launched on your phone. We will remind you check your blood pressure, heart rate and weight prior to your scheduled appointment. If you have an Apple Watch or Kardia, please upload any pertinent ECG strips the day before or morning of your appointment to [place]. Our staff will also make sure you have reviewed the consent and agree to move forward with your scheduled tele-health visit.     THE DAY OF YOUR APPOINTMENT  Approximately 15 minutes prior to your scheduled appointment, you will receive a telephone call from one of [facility] team - your caller ID may say "Unknown caller."  Our staff will confirm medications, vital signs for the day and any symptoms you may be experiencing. Please have this information available prior to the time of visit start. It may also be helpful for you to have a pad of paper and pen handy for any instructions given during your visit. They will also walk you through joining the smartphone meeting if this is a video visit.    CONSENT FOR TELE-HEALTH VISIT - PLEASE REVIEW  I hereby voluntarily request, consent and authorize [facility] and its employed or contracted physicians, physician assistants, nurse practitioners or other licensed health care professionals (the Practitioner), to provide me with telemedicine health care services (the "Services") as deemed necessary by the  treating Practitioner. I acknowledge and consent to receive the Services by the Practitioner via telemedicine. I understand that the telemedicine visit will involve communicating with the Practitioner through live audiovisual communication technology and the disclosure of certain medical information by electronic transmission. I acknowledge that I have been given the opportunity to request an in-person assessment or other available alternative prior to the telemedicine visit and am voluntarily participating in the telemedicine visit.  I understand that I have the right to withhold or withdraw my consent to the use of telemedicine in the course of my care at any time, without affecting my right to future care or treatment, and that the Practitioner or I may terminate the telemedicine visit at any time. I understand that I have the right to inspect all information obtained and/or recorded in the course of the telemedicine visit and may receive copies of available information for a reasonable fee.  I  understand that some of the potential risks of receiving the Services via telemedicine include:  [place] Delay or interruption in medical evaluation due to technological equipment failure or disruption; . Information transmitted may not be sufficient (e.g. poor resolution of images) to allow for appropriate medical decision making by the Practitioner; and/or  . In rare instances, security protocols could fail, causing a breach of personal health information.  Furthermore, I acknowledge that it is my responsibility to provide information about my medical history, conditions and care that is complete and accurate to the best of my ability. I acknowledge that Practitioner's advice, recommendations, and/or decision may be based on factors not within their control, such as incomplete or inaccurate data provided by me or distortions of diagnostic images or specimens that may result from electronic transmissions. I understand  that the practice of medicine is not an exact science and that Practitioner makes no warranties or guarantees regarding treatment outcomes. I acknowledge that I will receive a copy of this consent concurrently upon execution via email to the email address I last provided but may also request a printed copy by calling the office of [facility].    I understand that my insurance will be billed for this visit.   I have read or had this consent read to me. . I understand the contents of this consent, which adequately explains the benefits and risks of the Services being provided via telemedicine.  . I have been provided ample opportunity to ask questions regarding this consent and the Services and have had my questions answered to my satisfaction. . I give my informed consent for the services to be provided through the use of telemedicine in my medical care  By participating in this telemedicine visit I agree to the above. Pt consents.

## 2018-12-25 ENCOUNTER — Encounter: Payer: Self-pay | Admitting: Cardiology

## 2018-12-25 ENCOUNTER — Other Ambulatory Visit: Payer: Self-pay

## 2018-12-25 ENCOUNTER — Other Ambulatory Visit: Payer: Self-pay | Admitting: Cardiology

## 2018-12-25 ENCOUNTER — Telehealth (INDEPENDENT_AMBULATORY_CARE_PROVIDER_SITE_OTHER): Payer: Medicare Other | Admitting: Cardiology

## 2018-12-25 VITALS — BP 96/61 | HR 86 | Wt 130.0 lb

## 2018-12-25 DIAGNOSIS — M1711 Unilateral primary osteoarthritis, right knee: Secondary | ICD-10-CM | POA: Diagnosis not present

## 2018-12-25 DIAGNOSIS — G8929 Other chronic pain: Secondary | ICD-10-CM | POA: Diagnosis not present

## 2018-12-25 DIAGNOSIS — I6930 Unspecified sequelae of cerebral infarction: Secondary | ICD-10-CM

## 2018-12-25 DIAGNOSIS — I10 Essential (primary) hypertension: Secondary | ICD-10-CM

## 2018-12-25 DIAGNOSIS — I5022 Chronic systolic (congestive) heart failure: Secondary | ICD-10-CM

## 2018-12-25 DIAGNOSIS — E785 Hyperlipidemia, unspecified: Secondary | ICD-10-CM

## 2018-12-25 DIAGNOSIS — I2510 Atherosclerotic heart disease of native coronary artery without angina pectoris: Secondary | ICD-10-CM

## 2018-12-25 DIAGNOSIS — M25562 Pain in left knee: Secondary | ICD-10-CM | POA: Diagnosis not present

## 2018-12-25 DIAGNOSIS — I420 Dilated cardiomyopathy: Secondary | ICD-10-CM

## 2018-12-25 DIAGNOSIS — M25561 Pain in right knee: Secondary | ICD-10-CM | POA: Diagnosis not present

## 2018-12-25 DIAGNOSIS — Z95810 Presence of automatic (implantable) cardiac defibrillator: Secondary | ICD-10-CM

## 2018-12-25 MED ORDER — FUROSEMIDE 20 MG PO TABS: 20.0000 mg | ORAL_TABLET | Freq: Two times a day (BID) | ORAL | 0 refills | Status: DC

## 2018-12-25 NOTE — Progress Notes (Signed)
Virtual Visit via Telephone Note   This visit type was conducted due to national recommendations for restrictions regarding the COVID-19 Pandemic (e.g. social distancing) in an effort to limit this patient's exposure and mitigate transmission in our community.  Due to her co-morbid illnesses, this patient is at least at moderate risk for complications without adequate follow up.  This format is felt to be most appropriate for this patient at this time.  The patient did not have access to video technology/had technical difficulties with video requiring transitioning to audio format only (telephone).  All issues noted in this document were discussed and addressed.  No physical exam could be performed with this format.  Please refer to the patient's chart for her  consent to telehealth for [facility].  Evaluation Performed:  Follow-up visit  This visit type was conducted due to national recommendations for restrictions regarding the COVID-19 Pandemic (e.g. social distancing).  This format is felt to be most appropriate for this patient at this time.  All issues noted in this document were discussed and addressed.  No physical exam was performed (except for noted visual exam findings with Video Visits).  Please refer to the patient's chart (MyChart message for video visits and phone note for telephone visits) for the patient's consent to telehealth for [facility].  Date:  12/25/2018  ID: [name], DOB 29-Mar-1956, MRN [id number]   Patient Location: [address] [place] [address]   Provider location:   [place] Office  PCP:  Lillard Anes, MD  Cardiologist:  Jenne Campus, MD     Chief Complaint: I am doing fine  History of Present Illness:    [name] is a 63 y.o. female  who presents via audio/video conferencing for a telehealth visit today.  Past medical history significant for cardiomyopathy ejection fraction 30 to 35% from February 2020,  biV ICD, congestive heart failure, PVCs she does have a tele-visit with me today.  Overall she is doing well but described few issues first of all she described episodes of dizziness to the point of falling down it happens when she gets up and start walking she said since I have seen her last time she fell down probably about 10 times.  Never injured herself but worried about it and scenarios always typical she gets up start walking she feels down.  Also described to have some swelling of lower extremities but overall days does not bother her much.  Denies having any discharges from the defibrillator.  No shortness of breath no chest pain.   The patient does not have symptoms concerning for COVID-19 infection (fever, chills, cough, or new SHORTNESS OF BREATH).    Prior CV studies:   The following studies were reviewed today:  Echocardiogram reviewed which showed ejection fraction 3035%     Past Medical History:  Diagnosis Date   AICD (automatic cardioverter/defibrillator) present 2012    Arthritis    Arthritis    Asthma    CHF (congestive heart failure) (HCC)    Chronic back pain    COPD (chronic obstructive pulmonary disease) (HCC)    GERD (gastroesophageal reflux disease)    Heart failure    HLD (hyperlipidemia)    Hypertension    ICD (implantable cardiac defibrillator) in place 2012   Left ventricular systolic dysfunction    Myocardial infarction [facility])    2011 during intestinal blockage surgery   NICM (nonischemic cardiomyopathy) ([place])    PTSD (post-traumatic  stress disorder)    PVCs (premature ventricular contractions)    Thyroid disease     Past Surgical History:  Procedure Laterality Date   APPENDECTOMY     CARDIAC CATHETERIZATION     CARPAL TUNNEL RELEASE     CESAREAN SECTION     CHOLECYSTECTOMY     CORONARY ANGIOPLASTY     HAND SURGERY     ICD IMPLANT     Medtronic   intestinal blockage 2011     LAPAROSCOPIC ASSISTED VENTRAL HERNIA  REPAIR N/A 03/23/2015   Procedure: LAPAROSCOPIC VENTRAL WALL HERNIA REPAIR;  Surgeon: Michael Boston, MD;  Location: [facility];  Service: General;  Laterality: N/A;  With MESH   LAPAROSCOPIC LYSIS OF ADHESIONS N/A 03/23/2015   Procedure: LAPAROSCOPIC LYSIS OF ADHESIONS;  Surgeon: Michael Boston, MD;  Location: [facility];  Service: General;  Laterality: N/A;   NECK SURGERY     fused   TONSILLECTOMY     ULNAR NERVE TRANSPOSITION  01/23/2012   Procedure: ULNAR NERVE DECOMPRESSION/TRANSPOSITION;  Surgeon: Ophelia Charter, MD;  Location: [place] [facility];  Service: Neurosurgery;  Laterality: Left;  LEFT ulnar nerve decompression     Current Meds  Medication Sig   albuterol-ipratropium (COMBIVENT) 18-103 MCG/ACT inhaler Inhale 2 puffs into the lungs every 6 (six) hours as needed. For shortness of breath   aspirin 81 MG tablet Take 81 mg by mouth daily.     BELBUCA 300 MCG FILM Place 1 patch under the tongue every 12 (twelve) hours.   carvedilol (COREG) 3.125 MG tablet Take 1 tablet (3.125 mg total) by mouth 2 (two) times daily with a meal.   celecoxib (CELEBREX) 200 MG capsule Take 1 capsule by mouth daily.   diazepam (VALIUM) 5 MG tablet Take 1 tablet by mouth every 6 (six) hours as needed for anxiety.    esomeprazole (NEXIUM) 20 MG capsule Take 20 mg by mouth daily at 12 noon.   fluticasone (FLONASE) 50 MCG/ACT nasal spray Place 1 spray into both nostrils daily.   fluticasone furoate-vilanterol (BREO ELLIPTA) 100-25 MCG/INH AEPB Inhale 1 puff into the lungs daily.   furosemide (LASIX) 20 MG tablet Take 1 tablet (20 mg total) by mouth 2 (two) times daily. TAKE 2 TABLETS BY MOUTH EVERY MORNING AND 1 TABLET EVERY EVENING   gabapentin (NEURONTIN) 300 MG capsule Take 1 capsule by mouth 3 (three) times daily.   HYDROcodone-acetaminophen (NORCO) 10-325 MG per tablet Take 1-2 tablets by mouth every 4 (four) hours as needed for moderate pain or severe pain. (Patient taking differently: Take 1 tablet by  mouth every 6 (six) hours as needed for moderate pain or severe pain. )   ipratropium (ATROVENT) 0.02 % nebulizer solution Take 0.5 mg by nebulization every 4 (four) hours as needed for wheezing or shortness of breath.   levocetirizine (XYZAL) 5 MG tablet Take 5 mg by mouth every evening.   linaclotide (LINZESS) 145 MCG CAPS capsule Take 1 capsule by mouth daily.   nitroGLYCERIN (NITROSTAT) 0.4 MG SL tablet Place 1 tablet (0.4 mg total) under the tongue every 5 (five) minutes as needed for chest pain.   ranolazine (RANEXA) 500 MG 12 hr tablet Take 500 mg by mouth 2 (two) times daily.   rosuvastatin (CRESTOR) 10 MG tablet Take 10 mg by mouth daily.   sacubitril-valsartan (ENTRESTO) 24-26 MG Take 1 tablet by mouth 2 (two) times daily.   [DISCONTINUED] furosemide (LASIX) 20 MG tablet TAKE 2 TABLETS BY MOUTH EVERY MORNING AND 1 TABLET EVERY  EVENING (Patient taking differently: TAKE 2 TABLETS BY MOUTH EVERY MORNING AND 2 TABLET EVERY EVENING)      Family History: The patient's family history includes Alcohol abuse in her father; Cancer in her unknown relative; Heart attack in her father, mother, and unknown relative; Heart failure in her unknown relative; Hypertension in her father and mother. There is no history of Anesthesia problems, Hypotension, Malignant hyperthermia, or Pseudochol deficiency.   ROS:   Please see the history of present illness.     All other systems reviewed and are negative.   Labs/Other Tests and Data Reviewed:     Recent Labs: 06/23/2018: Hemoglobin 12.7; NT-Pro BNP 1,087; Platelets 458 07/03/2018: BUN 18; Creatinine, Ser 0.90; Potassium 4.6; Sodium 139  Recent Lipid Panel    Component Value Date/Time   CHOL 170 06/30/2017 0000   TRIG 160 (H) 06/30/2017 0000   HDL 64 06/30/2017 0000   CHOLHDL 2.7 06/30/2017 0000   LDLCALC 74 06/30/2017 0000      Exam:    Vital Signs:  BP 96/61    Pulse 86    Wt 130 lb (59 kg)    BMI 24.56 kg/m     Wt Readings  from Last 3 Encounters:  12/25/18 130 lb (59 kg)  08/10/18 133 lb (60.3 kg)  08/10/18 133 lb (60.3 kg)     Well nourished, well developed in no acute distress. On talking to her over the phone.  She has no technique for ability to get a video link.  She is alert awake oriented x3 cheerful and happy to be able to talk to me.  Diagnosis for this visit:   1. Late effect of cerebrovascular accident (CVA)   2. Dilated cardiomyopathy ([place])   3. Chronic systolic congestive heart failure, NYHA class 2 ([place])   4. Coronary artery disease involving native coronary artery of native heart without angina pectoris   5. Essential hypertension   6. 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 Dyslipidemia      ASSESSMENT & PLAN:    1.  Dizziness with syncope look like this is an orthostatic phenomenon always happen when she gets up and start walking we talked about measures that can be taken to prevent this from happening and what I want her to do is reduce dose of furosemide she takes 40 in the morning and 40 in the afternoon I want her to take 20 in the morning 20 in the afternoon.  I warned her about possibility of some swelling of the legs as well as some weight gain however if she does not have any shortness of breath and eventually weight gain stabilized we should be fine with taking only 20 mg of furosemide in the morning and 20 mg in the afternoon.  We also talk about potentially using elastic stockings sadly now because of coronavirus scenario there is a an accessible.  This is something she will need to consider in the future.  Because of this problem that need to care for management I will talk to her again in about a month. 2.  Dilated cardiomyopathy she is on Entresto small dose as well as beta-blocker also small dose that is the highest dose she is able to tolerate and still we do have some issue with orthostatic hypotension. 3.  Chronic systolic congestive heart failure  appears to be compensated I will get a cut down her diuretics with careful monitoring of potentially fluid overload. 4.  Dual ICD present close  to ERI.  We will make sure that her device will be interrogated remotely. 5.  Dyslipidemia we will continue with statin at present level. 6.  At the end of the visit she told me that the primary care physician did a CT of her head because of headaches and she was told that she did have a massive stroke.  We will try to get a copy of it I will also schedule her to have carotid ultrasound.  COVID-19 Education: The signs and symptoms of COVID-19 were discussed with the patient and how to seek care for testing (follow up with PCP or arrange E-visit).  The importance of social distancing was discussed today.  Patient Risk:   After full review of this patients clinical status, I feel that they are at least moderate risk at this time.  Time:   Today, I have spent 18 minutes with the patient with telehealth technology discussing pt health issues.  I spent 54minutes reviewing her chart before the visit.  Visit was finished at 4:45 PM.    Medication Adjustments/Labs and Tests Ordered: Current medicines are reviewed at length with the patient today.  Concerns regarding medicines are outlined above.  No orders of the defined types were placed in this encounter.  Medication changes:  Meds ordered this encounter  Medications   furosemide (LASIX) 20 MG tablet    Sig: Take 1 tablet (20 mg total) by mouth 2 (two) times daily. TAKE 2 TABLETS BY MOUTH EVERY MORNING AND 1 TABLET EVERY EVENING    Dispense:  90 tablet    Refill:  0     Disposition: Follow-up in 1 month, reduce furosemide to 20 twice daily, will schedule carotic ultrasound  Signed, Park Liter, MD, [facility] 12/25/2018 6:06 PM    [place]

## 2018-12-25 NOTE — Patient Instructions (Addendum)
Medication Instructions:  Your physician has recommended you make the following change in your medication:  DECREASE : Furosemide to 20 mg (1 tab) twice daily  .  If you need a refill on your cardiac medications before your next appointment, please call your pharmacy.   Lab work: None If you have labs (blood work) drawn today and your tests are completely normal, you will receive your results only by: Marland Kitchen MyChart Message (if you have MyChart) OR . A paper copy in the mail If you have any lab test that is abnormal or we need to change your treatment, we will call you to review the results.  Testing/Procedures: Your physician has requested that you have a carotid duplex. This test is an ultrasound of the carotid arteries in your neck. It looks at blood flow through these arteries that supply the brain with blood. Allow one hour for this exam. There are no restrictions or special instructions.    Follow-Up: At [facility], you and your health needs are our priority.  As part of our continuing mission to provide you with exceptional heart care, we have created designated Provider Care Teams.  These Care Teams include your primary Cardiologist (physician) and Advanced Practice Providers (APPs -  Physician Assistants and Nurse Practitioners) who all work together to provide you with the care you need, when you need it. You will need a follow up appointment in 1 months.   Any Other Special Instructions Will Be Listed Below (If Applicable).

## 2018-12-28 NOTE — Telephone Encounter (Signed)
Rx refill sent to pharmacy. 

## 2019-01-01 DIAGNOSIS — M545 Low back pain: Secondary | ICD-10-CM | POA: Diagnosis not present

## 2019-01-01 DIAGNOSIS — Z9189 Other specified personal risk factors, not elsewhere classified: Secondary | ICD-10-CM | POA: Diagnosis not present

## 2019-01-01 DIAGNOSIS — G8929 Other chronic pain: Secondary | ICD-10-CM | POA: Diagnosis not present

## 2019-01-01 DIAGNOSIS — M5136 Other intervertebral disc degeneration, lumbar region: Secondary | ICD-10-CM | POA: Diagnosis not present

## 2019-01-08 DIAGNOSIS — M25562 Pain in left knee: Secondary | ICD-10-CM | POA: Diagnosis not present

## 2019-01-08 DIAGNOSIS — M25561 Pain in right knee: Secondary | ICD-10-CM | POA: Diagnosis not present

## 2019-01-08 DIAGNOSIS — M170 Bilateral primary osteoarthritis of knee: Secondary | ICD-10-CM | POA: Diagnosis not present

## 2019-01-10 DIAGNOSIS — J449 Chronic obstructive pulmonary disease, unspecified: Secondary | ICD-10-CM | POA: Diagnosis not present

## 2019-01-21 DIAGNOSIS — E782 Mixed hyperlipidemia: Secondary | ICD-10-CM | POA: Diagnosis not present

## 2019-01-21 DIAGNOSIS — I2510 Atherosclerotic heart disease of native coronary artery without angina pectoris: Secondary | ICD-10-CM | POA: Diagnosis not present

## 2019-01-21 DIAGNOSIS — I110 Hypertensive heart disease with heart failure: Secondary | ICD-10-CM | POA: Diagnosis not present

## 2019-01-21 DIAGNOSIS — J449 Chronic obstructive pulmonary disease, unspecified: Secondary | ICD-10-CM | POA: Diagnosis not present

## 2019-01-21 DIAGNOSIS — I5022 Chronic systolic (congestive) heart failure: Secondary | ICD-10-CM | POA: Diagnosis not present

## 2019-01-22 ENCOUNTER — Telehealth: Payer: Medicare Other | Admitting: Cardiology

## 2019-01-27 DIAGNOSIS — M1711 Unilateral primary osteoarthritis, right knee: Secondary | ICD-10-CM | POA: Diagnosis not present

## 2019-01-27 DIAGNOSIS — M1712 Unilateral primary osteoarthritis, left knee: Secondary | ICD-10-CM | POA: Diagnosis not present

## 2019-01-29 ENCOUNTER — Telehealth: Payer: Medicare Other | Admitting: Cardiology

## 2019-01-29 DIAGNOSIS — M546 Pain in thoracic spine: Secondary | ICD-10-CM | POA: Diagnosis not present

## 2019-01-29 DIAGNOSIS — M5134 Other intervertebral disc degeneration, thoracic region: Secondary | ICD-10-CM | POA: Diagnosis not present

## 2019-01-29 DIAGNOSIS — M545 Low back pain: Secondary | ICD-10-CM | POA: Diagnosis not present

## 2019-01-29 DIAGNOSIS — Z1159 Encounter for screening for other viral diseases: Secondary | ICD-10-CM | POA: Diagnosis not present

## 2019-01-29 DIAGNOSIS — G8929 Other chronic pain: Secondary | ICD-10-CM | POA: Diagnosis not present

## 2019-01-29 DIAGNOSIS — Z79899 Other long term (current) drug therapy: Secondary | ICD-10-CM | POA: Diagnosis not present

## 2019-02-04 NOTE — Progress Notes (Signed)
NEUROLOGY CONSULTATION NOTE  DALY WHIPKEY MRN: [id number] DOB: 20-Jan-1956  Referring provider: Reinaldo Meeker, MD Primary care provider: Reinaldo Meeker, MD  Reason for consult:  Migraines, abnormal brain CT, recurrent falls  HISTORY OF PRESENT ILLNESS: [name] is a 63 year old Caucasian woman with CHF with EF 30-35%, s/p AICD, COPD, chronic back pain, chronic knee pain, arthritis, HTN, hyperlipidemia and PTSD and history of MI and former smoker and history of childhood seizures who presents for migraines and recurrent falls.  History supplemented by pain management, cardiology and referring provider notes.  She has had headaches since around 2017 but gradually got worse, particularly since November 2019.  They are severe bifrontal pounding/squeezing headache.  There is no aura.  They are associated with photophobia, phonophobia, blurred vision and dizziness.  They typically last 1 hour and occur 5 to 6 a day every day.  They are triggered by loud noise.  Sumatriptan helps relieve the intensity but does not abort it..  Since November 2019, she also has had episodes of dizziness outside of the dizziness as well, causing her to fall.  She reports sudden spinning sensation lasting a few seconds and resolves.  She says it is not positional.  She has associated nausea and blurred vision.  She says it occurs 2 to 3 times a day.    She had a CT of head without contrast on 11/02/18 which was personally reviewed and was unremarkable, demonstrating mild atrophy and mild chronic small vessel ischemic changes but no acute intracranial abnormality.  She is treated by pain management for chronic pain.  She has degenerative disc disease of the cervical spine with previous neck fusion.  She has history of total right hip replacement.  She has chronic low back pain with radicular symptoms.  She has bilateral knee pain.  Current NSAIDS:  ASA 81mg  Current analgesics:  none Current triptans:  Sumatriptan  25mg  Current ergotamine:  none Current anti-emetic:  none Current muscle relaxants:  none Current anti-anxiolytic:  Valium Current sleep aide:  none Current Antihypertensive medications:  Coreg, Lasix Current Antidepressant medications:   Current Anticonvulsant medications:  Gabapentin 300mg  three times daily (for chronic neck and back pain) Current anti-CGRP:  none Current Vitamins/Herbal/Supplements:  none Current Antihistamines/Decongestants:  none Other therapy:  none Hormone/birth control:  none  Past NSAIDS:  none Past analgesics:  Tylenol (contraindicated due to CHF) Past abortive triptans:  none Past abortive ergotamine:  none Past muscle relaxants:  none Past anti-emetic:  none Past antihypertensive medications:  lisinopril Past antidepressant medications:  none Past anticonvulsant medications:  none Past anti-CGRP:  none Past vitamins/Herbal/Supplements:  none Past antihistamines/decongestants:  none Other past therapies:  none  Caffeine:  3 cups of coffee daily Alcohol:  no Smoker: former Diet: 100 oz water daily.  No soda.  Exercise: no    PAST MEDICAL HISTORY: Past Medical History:  Diagnosis Date  . AICD (automatic cardioverter/defibrillator) present 2012   . Arthritis   . Arthritis   . Asthma   . CHF (congestive heart failure) ([place])   . Chronic back pain   . COPD (chronic obstructive pulmonary disease) ([place])   . GERD (gastroesophageal reflux disease)   . Heart failure   . HLD (hyperlipidemia)   . Hypertension   . ICD (implantable cardiac defibrillator) in place 2012  . Left ventricular systolic dysfunction   . Myocardial infarction [facility])    2011 during intestinal blockage surgery  . NICM (nonischemic cardiomyopathy) ([place])   . PTSD (  post-traumatic stress disorder)   . PVCs (premature ventricular contractions)   . Thyroid disease     PAST SURGICAL HISTORY: Past Surgical History:  Procedure Laterality Date  . APPENDECTOMY    . CARDIAC  CATHETERIZATION    . CARPAL TUNNEL RELEASE    . CESAREAN SECTION    . CHOLECYSTECTOMY    . CORONARY ANGIOPLASTY    . HAND SURGERY    . ICD IMPLANT     Medtronic  . intestinal blockage 2011    . LAPAROSCOPIC ASSISTED VENTRAL HERNIA REPAIR N/A 03/23/2015   Procedure: LAPAROSCOPIC VENTRAL WALL HERNIA REPAIR;  Surgeon: Michael Boston, MD;  Location: [facility];  Service: General;  Laterality: N/A;  With MESH  . LAPAROSCOPIC LYSIS OF ADHESIONS N/A 03/23/2015   Procedure: LAPAROSCOPIC LYSIS OF ADHESIONS;  Surgeon: Michael Boston, MD;  Location: [facility];  Service: General;  Laterality: N/A;  . NECK SURGERY     fused  . TONSILLECTOMY    . ULNAR NERVE TRANSPOSITION  01/23/2012   Procedure: ULNAR NERVE DECOMPRESSION/TRANSPOSITION;  Surgeon: Ophelia Charter, MD;  Location: [place] [facility];  Service: Neurosurgery;  Laterality: Left;  LEFT ulnar nerve decompression    MEDICATIONS: Current Outpatient Medications on File Prior to Visit  Medication Sig Dispense Refill  . albuterol-ipratropium (COMBIVENT) 18-103 MCG/ACT inhaler Inhale 2 puffs into the lungs every 6 (six) hours as needed. For shortness of breath    . aspirin 81 MG tablet Take 81 mg by mouth daily.      Marland Kitchen BELBUCA 300 MCG FILM Place 1 patch under the tongue every 12 (twelve) hours.    . carvedilol (COREG) 3.125 MG tablet Take 1 tablet (3.125 mg total) by mouth 2 (two) times daily with a meal. 180 tablet 3  . celecoxib (CELEBREX) 200 MG capsule Take 1 capsule by mouth daily.  3  . diazepam (VALIUM) 5 MG tablet Take 1 tablet by mouth every 6 (six) hours as needed for anxiety.   3  . esomeprazole (NEXIUM) 20 MG capsule Take 20 mg by mouth daily at 12 noon.    . fluticasone (FLONASE) 50 MCG/ACT nasal spray Place 1 spray into both nostrils daily.    . fluticasone furoate-vilanterol (BREO ELLIPTA) 100-25 MCG/INH AEPB Inhale 1 puff into the lungs daily.    . furosemide (LASIX) 20 MG tablet Take 2 tablets (40 mg total) by mouth daily. 180 tablet 0  .  gabapentin (NEURONTIN) 300 MG capsule Take 1 capsule by mouth 3 (three) times daily.  2  . HYDROcodone-acetaminophen (NORCO) 10-325 MG per tablet Take 1-2 tablets by mouth every 4 (four) hours as needed for moderate pain or severe pain. (Patient taking differently: Take 1 tablet by mouth every 6 (six) hours as needed for moderate pain or severe pain. ) 40 tablet 0  . ipratropium (ATROVENT) 0.02 % nebulizer solution Take 0.5 mg by nebulization every 4 (four) hours as needed for wheezing or shortness of breath.    . levocetirizine (XYZAL) 5 MG tablet Take 5 mg by mouth every evening.    . linaclotide (LINZESS) 145 MCG CAPS capsule Take 1 capsule by mouth daily.    . nitroGLYCERIN (NITROSTAT) 0.4 MG SL tablet Place 1 tablet (0.4 mg total) under the tongue every 5 (five) minutes as needed for chest pain. 25 tablet 6  . ranolazine (RANEXA) 500 MG 12 hr tablet Take 500 mg by mouth 2 (two) times daily.    . rosuvastatin (CRESTOR) 10 MG tablet Take 10 mg by mouth  daily.    . sacubitril-valsartan (ENTRESTO) 24-26 MG Take 1 tablet by mouth 2 (two) times daily. 180 tablet 1   No current facility-administered medications on file prior to visit.     ALLERGIES: No Known Allergies  FAMILY HISTORY: Family History  Problem Relation Age of Onset  . Heart attack Unknown   . Cancer Unknown   . Heart failure Unknown   . Hypertension Mother   . Heart attack Mother   . Alcohol abuse Father   . Hypertension Father   . Heart attack Father   . Anesthesia problems Neg Hx   . Hypotension Neg Hx   . Malignant hyperthermia Neg Hx   . Pseudochol deficiency Neg Hx    SOCIAL HISTORY: Social History   Socioeconomic History  . Marital status: Single    Spouse name: Not on file  . Number of children: 3  . Years of education: Not on file  . Highest education level: Not on file  Occupational History  . Occupation: disabled  Social Needs  . Financial resource strain: Not on file  . Food insecurity    Worry:  Not on file    Inability: Not on file  . Transportation needs    Medical: Not on file    Non-medical: Not on file  Tobacco Use  . Smoking status: Former Smoker    Packs/day: 1.00    Years: 30.00    Pack years: 30.00    Types: Cigarettes    Quit date: 08/11/2010    Years since quitting: 8.4  . Smokeless tobacco: Never Used  Substance and Sexual Activity  . Alcohol use: No  . Drug use: No  . Sexual activity: Not Currently  Lifestyle  . Physical activity    Days per week: Not on file    Minutes per session: Not on file  . Stress: Not on file  Relationships  . Social Herbalist on phone: Not on file    Gets together: Not on file    Attends religious service: Not on file    Active member of club or organization: Not on file    Attends meetings of clubs or organizations: Not on file    Relationship status: Not on file  . Intimate partner violence    Fear of current or ex partner: Not on file    Emotionally abused: Not on file    Physically abused: Not on file    Forced sexual activity: Not on file  Other Topics Concern  . Not on file  Social History Narrative  . Not on file    REVIEW OF SYSTEMS: Constitutional: No fevers, chills, or sweats, no generalized fatigue, change in appetite Eyes: No visual changes, double vision, eye pain Ear, nose and throat: No hearing loss, ear pain, nasal congestion, sore throat Cardiovascular: No chest pain, palpitations Respiratory:  No shortness of breath at rest or with exertion, wheezes GastrointestinaI: No nausea, vomiting, diarrhea, abdominal pain, fecal incontinence Genitourinary:  No dysuria, urinary retention or frequency Musculoskeletal:  No neck pain, back pain Integumentary: No rash, pruritus, skin lesions Neurological: as above Psychiatric: No depression, insomnia, anxiety Endocrine: No palpitations, fatigue, diaphoresis, mood swings, change in appetite, change in weight, increased thirst Hematologic/Lymphatic:  No  purpura, petechiae. Allergic/Immunologic: no itchy/runny eyes, nasal congestion, recent allergic reactions, rashes  PHYSICAL EXAM: Blood pressure 101/61, pulse (!) 59, weight 132 lb (59.9 kg), SpO2 95 %. General: No acute distress.  Patient appears well-groomed.   Head:  Normocephalic/atraumatic Eyes:  fundi examined but not visualized Neck: supple, no paraspinal tenderness, full range of motion Back: No paraspinal tenderness Heart: regular rate and rhythm Lungs: Clear to auscultation bilaterally. Vascular: No carotid bruits. Neurological Exam: Mental status: alert and oriented to person, place, and time, recent and remote memory intact, fund of knowledge intact, attention and concentration intact, speech fluent and not dysarthric, language intact. Cranial nerves: CN I: not tested CN II: pupils equal, round and reactive to light, visual fields intact CN III, IV, VI:  full range of motion, no nystagmus, no ptosis CN V: facial sensation intact CN VII: upper and lower face symmetric CN VIII: hearing intact CN IX, X: gag intact, uvula midline CN XI: sternocleidomastoid and trapezius muscles intact CN XII: tongue midline Bulk & Tone: normal, no fasciculations. Motor:  5/5 throughout  Sensation:  temperature and vibration sensation intact. Deep Tendon Reflexes:  2+ throughout, toes downgoing.  Finger to nose testing:  Without dysmetria.   Heel to shin:  Without dysmetria.   Gait:  Normal station and stride.  Able to turn and tandem walk. Romberg negative.  IMPRESSION: 1.  Migraine without aura, without status migrainosus, not intractable 2.  Dizziness.  Brief.  Consider BPPV  PLAN: 1.  For preventative management, topiramate 50mg  at bedtime 2.  For abortive therapy, Ubrelvy.  Stop sumatriptan 3.  Limit use of pain relievers to no more than 2 days out of week to prevent risk of rebound or medication-overuse headache. 4.  Keep headache diary 5.  Exercise, hydration, caffeine  cessation, sleep hygiene, monitor for and avoid triggers 6.  Consider:  magnesium citrate 400mg  daily, riboflavin 400mg  daily, and coenzyme Q10 100mg  three times daily 7. Always keep in mind that currently taking a hormone or birth control may be a possible trigger or aggravating factor for migraine. 8. Follow up 4 months   Thank you for allowing me to take part in the care of this patient.  Metta Clines, DO  CC: Reinaldo Meeker, MD

## 2019-02-05 ENCOUNTER — Encounter: Payer: Self-pay | Admitting: Neurology

## 2019-02-05 ENCOUNTER — Telehealth (INDEPENDENT_AMBULATORY_CARE_PROVIDER_SITE_OTHER): Payer: Medicare Other | Admitting: Cardiology

## 2019-02-05 ENCOUNTER — Encounter: Payer: Self-pay | Admitting: Cardiology

## 2019-02-05 ENCOUNTER — Ambulatory Visit (INDEPENDENT_AMBULATORY_CARE_PROVIDER_SITE_OTHER): Payer: Medicare Other | Admitting: Neurology

## 2019-02-05 ENCOUNTER — Other Ambulatory Visit: Payer: Self-pay

## 2019-02-05 VITALS — BP 90/43 | HR 73 | Wt 130.0 lb

## 2019-02-05 VITALS — BP 101/61 | HR 59 | Wt 132.0 lb

## 2019-02-05 DIAGNOSIS — E785 Hyperlipidemia, unspecified: Secondary | ICD-10-CM | POA: Diagnosis not present

## 2019-02-05 DIAGNOSIS — I420 Dilated cardiomyopathy: Secondary | ICD-10-CM

## 2019-02-05 DIAGNOSIS — Z95810 Presence of automatic (implantable) cardiac defibrillator: Secondary | ICD-10-CM | POA: Diagnosis not present

## 2019-02-05 DIAGNOSIS — G43009 Migraine without aura, not intractable, without status migrainosus: Secondary | ICD-10-CM | POA: Diagnosis not present

## 2019-02-05 DIAGNOSIS — I5022 Chronic systolic (congestive) heart failure: Secondary | ICD-10-CM

## 2019-02-05 DIAGNOSIS — I10 Essential (primary) hypertension: Secondary | ICD-10-CM

## 2019-02-05 DIAGNOSIS — R42 Dizziness and giddiness: Secondary | ICD-10-CM | POA: Diagnosis not present

## 2019-02-05 DIAGNOSIS — I110 Hypertensive heart disease with heart failure: Secondary | ICD-10-CM

## 2019-02-05 MED ORDER — TOPIRAMATE 50 MG PO TABS: 50.0000 mg | ORAL_TABLET | Freq: Every day | ORAL | 3 refills | Status: DC

## 2019-02-05 MED ORDER — UBRELVY 100 MG PO TABS: 1.0000 | ORAL_TABLET | ORAL | 3 refills | Status: DC | PRN

## 2019-02-05 NOTE — Patient Instructions (Signed)
1.  Start topiramate 50mg  tablet.  Take 1/2 tablet at bedtime for one week, then increase to 1 tablet at bedtime.  At end of prescription, if headaches not improved then contact me.  Otherwise, you may refill prescription. 2.  STOP SUMATRIPTAN.  At earliest onset of headache, take Ubrelvy 100mg .  May repeat dose once after 2 hours if needed (maximum 2 tablets in 24 hours) 3.  Limit use of pain relievers to no more than 2 days out of week to prevent risk of rebound or medication-overuse headache. 4.  Keep headache diary 5.  Exercise, hydration, caffeine cessation, sleep hygiene, monitor for and avoid triggers 6.  Consider:  magnesium citrate 400mg  daily, riboflavin 400mg  daily, and coenzyme Q10 100mg  three times daily 7. Always keep in mind that currently taking a hormone or birth control may be a possible trigger or aggravating factor for migraine. 8. Follow up in 4 months

## 2019-02-05 NOTE — Progress Notes (Signed)
Virtual Visit via Telephone Note   This visit type was conducted due to national recommendations for restrictions regarding the COVID-19 Pandemic (e.g. social distancing) in an effort to limit this patient's exposure and mitigate transmission in our community.  Due to her co-morbid illnesses, this patient is at least at moderate risk for complications without adequate follow up.  This format is felt to be most appropriate for this patient at this time.  The patient did not have access to video technology/had technical difficulties with video requiring transitioning to audio format only (telephone).  All issues noted in this document were discussed and addressed.  No physical exam could be performed with this format.  Please refer to the patient's chart for her  consent to telehealth for [facility].  Evaluation Performed:  Follow-up visit  This visit type was conducted due to national recommendations for restrictions regarding the COVID-19 Pandemic (e.g. social distancing).  This format is felt to be most appropriate for this patient at this time.  All issues noted in this document were discussed and addressed.  No physical exam was performed (except for noted visual exam findings with Video Visits).  Please refer to the patient's chart (MyChart message for video visits and phone note for telephone visits) for the patient's consent to telehealth for [facility].  Date:  02/05/2019  ID: [name], DOB Mar 22, 1956, MRN [id number]   Patient Location: [address] [place] [address]   Provider location:   [place] Office  PCP:  Lillard Anes, MD  Cardiologist:  Jenne Campus, MD     Chief Complaint: I am still having some dizziness  History of Present Illness:    [name] is a 63 y.o. female  who presents via audio/video conferencing for a telehealth visit today.  With history of cardiomyopathy, last estimation of left ventricular ejection  fraction done by echocardiogram in February of this year showing ejection fraction 30 to 35%, ICD present close to ERI, congestive heart failure New [place] Heart Association 2/3, COPD, I do have virtual visit with her today.  Issue that we would dealing with lately is dizziness.  She apparently have dizzy spells especially when she gets up very quickly my initial impression was potentially orthostatic hypotension I cut down her diuretic but that did not make any changes she noted some swelling of lower extremities happening again she went back to the previous dose of furosemide which is 40 mg twice daily.  She does have appointment today with neurology to talk about large stroke that was identified incidentally after CT of her head was done.  Her device is also close to ERI and Monday she will have remote interrogation of the device.   The patient does not have symptoms concerning for COVID-19 infection (fever, chills, cough, or new SHORTNESS OF BREATH).    Prior CV studies:   The following studies were reviewed today:       Past Medical History:  Diagnosis Date  . AICD (automatic cardioverter/defibrillator) present 2012   . Arthritis   . Arthritis   . Asthma   . CHF (congestive heart failure) ([place])   . Chronic back pain   . COPD (chronic obstructive pulmonary disease) ([place])   . GERD (gastroesophageal reflux disease)   . Heart failure   . HLD (hyperlipidemia)   . Hypertension   . ICD (implantable cardiac defibrillator) in place 2012  . Left ventricular systolic dysfunction   . Myocardial infarction (  [place])    2011 during intestinal blockage surgery  . NICM (nonischemic cardiomyopathy) ([place])   . PTSD (post-traumatic stress disorder)   . PVCs (premature ventricular contractions)   . Thyroid disease     Past Surgical History:  Procedure Laterality Date  . APPENDECTOMY    . CARDIAC CATHETERIZATION    . CARPAL TUNNEL RELEASE    . CESAREAN SECTION    . CHOLECYSTECTOMY    . CORONARY  ANGIOPLASTY    . HAND SURGERY    . ICD IMPLANT     Medtronic  . intestinal blockage 2011    . LAPAROSCOPIC ASSISTED VENTRAL HERNIA REPAIR N/A 03/23/2015   Procedure: LAPAROSCOPIC VENTRAL WALL HERNIA REPAIR;  Surgeon: Michael Boston, MD;  [facility];  Service: General;  Laterality: N/A;  With MESH  . LAPAROSCOPIC LYSIS OF ADHESIONS N/A 03/23/2015   Procedure: LAPAROSCOPIC LYSIS OF ADHESIONS;  Surgeon: Michael Boston, MD;  [facility];  Service: General;  Laterality: N/A;  . NECK SURGERY     fused  . TONSILLECTOMY    . ULNAR NERVE TRANSPOSITION  01/23/2012   Procedure: ULNAR NERVE DECOMPRESSION/TRANSPOSITION;  Surgeon: Ophelia Charter, MD;  Location: [place] [facility]UR[facility];  Service: Neurosurgery;  Laterality: Left;  LEFT ulnar nerve decompression     Current Meds  Medication Sig  . albuterol-ipratropium (COMBIVENT) 18-103 MCG/ACT inhaler Inhale 2 puffs into the lungs every 6 (six) hours as needed. For shortness of breath  . aspirin 81 MG tablet Take 81 mg by mouth daily.    Marland Kitchen BELBUCA 300 MCG FILM Place 1 patch under the tongue every 12 (twelve) hours.  . carvedilol (COREG) 3.125 MG tablet Take 1 tablet (3.125 mg total) by mouth 2 (two) times daily with a meal.  . celecoxib (CELEBREX) 200 MG capsule Take 1 capsule by mouth daily.  . diazepam (VALIUM) 5 MG tablet Take 1 tablet by mouth every 6 (six) hours as needed for anxiety.   Marland Kitchen esomeprazole (NEXIUM) 20 MG capsule Take 20 mg by mouth daily at 12 noon.  . fluticasone (FLONASE) 50 MCG/ACT nasal spray Place 1 spray into both nostrils daily.  . fluticasone furoate-vilanterol (BREO ELLIPTA) 100-25 MCG/INH AEPB Inhale 1 puff into the lungs daily.  . furosemide (LASIX) 20 MG tablet Take 2 tablets (40 mg total) by mouth daily.  Marland Kitchen gabapentin (NEURONTIN) 300 MG capsule Take 1 capsule by mouth 3 (three) times daily.  Marland Kitchen HYDROcodone-acetaminophen (NORCO) 10-325 MG per tablet Take 1-2 tablets by mouth every 4 (four) hours as needed for moderate pain or  severe pain. (Patient taking differently: Take 1 tablet by mouth every 6 (six) hours as needed for moderate pain or severe pain. )  . ipratropium (ATROVENT) 0.02 % nebulizer solution Take 0.5 mg by nebulization every 4 (four) hours as needed for wheezing or shortness of breath.  . levocetirizine (XYZAL) 5 MG tablet Take 5 mg by mouth every evening.  . linaclotide (LINZESS) 145 MCG CAPS capsule Take 1 capsule by mouth daily.  . nitroGLYCERIN (NITROSTAT) 0.4 MG SL tablet Place 1 tablet (0.4 mg total) under the tongue every 5 (five) minutes as needed for chest pain.  . ranolazine (RANEXA) 500 MG 12 hr tablet Take 500 mg by mouth 2 (two) times daily.  . rosuvastatin (CRESTOR) 10 MG tablet Take 10 mg by mouth daily.  . sacubitril-valsartan (ENTRESTO) 24-26 MG Take 1 tablet by mouth 2 (two) times daily.      Family History: The patient's family history includes Alcohol abuse  in her father; Cancer in her unknown relative; Heart attack in her father, mother, and unknown relative; Heart failure in her unknown relative; Hypertension in her father and mother. There is no history of Anesthesia problems, Hypotension, Malignant hyperthermia, or Pseudochol deficiency.   ROS:   Please see the history of present illness.     All other systems reviewed and are negative.   Labs/Other Tests and Data Reviewed:     Recent Labs: 06/23/2018: Hemoglobin 12.7; NT-Pro BNP 1,087; Platelets 458 07/03/2018: BUN 18; Creatinine, Ser 0.90; Potassium 4.6; Sodium 139  Recent Lipid Panel    Component Value Date/Time   CHOL 170 06/30/2017 0000   TRIG 160 (H) 06/30/2017 0000   HDL 64 06/30/2017 0000   CHOLHDL 2.7 06/30/2017 0000   LDLCALC 74 06/30/2017 0000      Exam:    Vital Signs:  BP (!) 90/43   Pulse 73   Wt 130 lb (59 kg)   BMI 24.56 kg/m     Wt Readings from Last 3 Encounters:  02/05/19 130 lb (59 kg)  12/25/18 130 lb (59 kg)  08/10/18 133 lb (60.3 kg)     Well nourished, well developed in no  acute distress. Alert awake alert x3.  She was unable to establish video link therefore we only talk over the phone.  Not in any distress.  Happy to be able to talk to me somewhat slow in responses  Diagnosis for this visit:   1. Dilated cardiomyopathy ([place])   2. Chronic systolic congestive heart failure, NYHA class 2 ([place])   3. Essential hypertension   4. 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   5. Dyslipidemia      ASSESSMENT & PLAN:    1.  Dilated cardiomyopathy on maximal tolerated medications.  We do have difficulty getting this medication because of blood pressure being low.  Her ejection fraction 30 to 35%. 2.  Chronic congestive heart failure.  Compensated we will continue present management. 3.  Essential hypertension opposite problem blood pressure being low. 4.  Dual ICD.  Close to [facility] interrogation will be done on Monday remotely.  I explained to her one more time that that would not be the one to replacing her device.  That will be Dr. Curt Bears.  She already met him. 5.  Dyslipidemia continue with Crestor  COVID-19 Education: The signs and symptoms of COVID-19 were discussed with the patient and how to seek care for testing (follow up with PCP or arrange E-visit).  The importance of social distancing was discussed today.  Patient Risk:   After full review of this patients clinical status, I feel that they are at least moderate risk at this time.  Time:   Today, I have spent 17 minutes with the patient with telehealth technology discussing pt health issues.  I spent 5 minutes reviewing her chart before the visit.  Visit was finished at 10:58 AM.    Medication Adjustments/Labs and Tests Ordered: Current medicines are reviewed at length with the patient today.  Concerns regarding medicines are outlined above.  No orders of the defined types were placed in this encounter.  Medication changes: No orders of the defined types were placed in this encounter.     Disposition: Follow-up in 2 months  Signed, Park Liter, MD, [facility] 02/05/2019 10:58 AM    [place]

## 2019-02-05 NOTE — Patient Instructions (Signed)
Medication Instructions:  Your physician recommends that you continue on your current medications as directed. Please refer to the Current Medication list given to you today.  If you need a refill on your cardiac medications before your next appointment, please call your pharmacy.   Lab work: None.  If you have labs (blood work) drawn today and your tests are completely normal, you will receive your results only by: . MyChart Message (if you have MyChart) OR . A paper copy in the mail If you have any lab test that is abnormal or we need to change your treatment, we will call you to review the results.  Testing/Procedures: None   Follow-Up: At [facility], you and your health needs are our priority.  As part of our continuing mission to provide you with exceptional heart care, we have created designated Provider Care Teams.  These Care Teams include your primary Cardiologist (physician) and Advanced Practice Providers (APPs -  Physician Assistants and Nurse Practitioners) who all work together to provide you with the care you need, when you need it. You will need a follow up appointment in 2 months.  Please call our office 2 months in advance to schedule this appointment.  You may see No primary care provider on file. or another member of our [facility] Provider Team in [place]: Brian Munley, MD . Rajan Revankar, MD  Any Other Special Instructions Will Be Listed Below (If Applicable).    

## 2019-02-08 ENCOUNTER — Ambulatory Visit (INDEPENDENT_AMBULATORY_CARE_PROVIDER_SITE_OTHER): Payer: Medicare Other | Admitting: *Deleted

## 2019-02-08 ENCOUNTER — Encounter: Payer: Self-pay | Admitting: Neurology

## 2019-02-08 DIAGNOSIS — I420 Dilated cardiomyopathy: Secondary | ICD-10-CM

## 2019-02-08 LAB — CUP PACEART REMOTE DEVICE CHECK
Battery Voltage: 2.62 V
Brady Statistic AP VP Percent: 0.07 %
Brady Statistic AP VS Percent: 37.96 %
Brady Statistic AS VP Percent: 0.03 %
Brady Statistic AS VS Percent: 61.94 %
Brady Statistic RA Percent Paced: 36.64 %
Brady Statistic RV Percent Paced: 0.15 %
Date Time Interrogation Session: 20200615062826
HighPow Impedance: 551 Ohm
HighPow Impedance: 75 Ohm
Implantable Lead Implant Date: 20120502
Implantable Lead Implant Date: 20120502
Implantable Lead Location: 753859
Implantable Lead Location: 753860
Implantable Lead Model: 4076
Implantable Lead Model: 7122
Implantable Pulse Generator Implant Date: 20120502
Lead Channel Impedance Value: 418 Ohm
Lead Channel Impedance Value: 703 Ohm
Lead Channel Pacing Threshold Amplitude: 0.75 V
Lead Channel Pacing Threshold Amplitude: 0.875 V
Lead Channel Pacing Threshold Pulse Width: 0.4 ms
Lead Channel Pacing Threshold Pulse Width: 0.4 ms
Lead Channel Sensing Intrinsic Amplitude: 2.5 mV
Lead Channel Sensing Intrinsic Amplitude: 2.5 mV
Lead Channel Sensing Intrinsic Amplitude: 8.5 mV
Lead Channel Sensing Intrinsic Amplitude: 8.5 mV
Lead Channel Setting Pacing Amplitude: 1.5 V
Lead Channel Setting Pacing Amplitude: 2 V
Lead Channel Setting Pacing Pulse Width: 0.4 ms
Lead Channel Setting Sensing Sensitivity: 0.3 mV

## 2019-02-08 NOTE — Progress Notes (Signed)
Request Reference Number: [id number]. UBRELVY TAB 100MG  is approved through 08/26/2019. For further questions, call [phone]

## 2019-02-08 NOTE — Progress Notes (Signed)
[name] 100mg  prior authorization submitted via covermymeds.  Waiting response.

## 2019-02-09 DIAGNOSIS — Z0000 Encounter for general adult medical examination without abnormal findings: Secondary | ICD-10-CM | POA: Diagnosis not present

## 2019-02-09 DIAGNOSIS — I10 Essential (primary) hypertension: Secondary | ICD-10-CM | POA: Diagnosis not present

## 2019-02-09 DIAGNOSIS — E782 Mixed hyperlipidemia: Secondary | ICD-10-CM | POA: Diagnosis not present

## 2019-02-10 DIAGNOSIS — J449 Chronic obstructive pulmonary disease, unspecified: Secondary | ICD-10-CM | POA: Diagnosis not present

## 2019-02-12 DIAGNOSIS — G8929 Other chronic pain: Secondary | ICD-10-CM | POA: Diagnosis not present

## 2019-02-12 DIAGNOSIS — M1711 Unilateral primary osteoarthritis, right knee: Secondary | ICD-10-CM | POA: Diagnosis not present

## 2019-02-12 DIAGNOSIS — M25561 Pain in right knee: Secondary | ICD-10-CM | POA: Diagnosis not present

## 2019-02-15 ENCOUNTER — Encounter: Payer: Self-pay | Admitting: Cardiology

## 2019-02-15 ENCOUNTER — Telehealth: Payer: Self-pay | Admitting: Cardiology

## 2019-02-15 DIAGNOSIS — J449 Chronic obstructive pulmonary disease, unspecified: Secondary | ICD-10-CM | POA: Diagnosis not present

## 2019-02-15 MED ORDER — FUROSEMIDE 40 MG PO TABS: ORAL_TABLET | ORAL | 3 refills | Status: DC

## 2019-02-15 NOTE — Progress Notes (Signed)
Remote ICD transmission.   

## 2019-02-15 NOTE — Telephone Encounter (Signed)
-----   Message from Will Meredith Leeds, MD sent at 02/10/2019 11:14 AM EDT ----- Abnormal device interrogation reviewed.  Lead parameters and battery status stable.  Battery close to ERI. Needs monthly checks. optivol elevated. Increase lasix to 40 mg BID for 5 days.

## 2019-02-15 NOTE — Telephone Encounter (Signed)
New Message    Pt is returning call for [name]     Please call back

## 2019-02-15 NOTE — Telephone Encounter (Signed)
Pt has been notified of device check results and recommendations per Dr. Curt Bears. Pt is agreeable to increase lasix to 40 mg BID, new Rx has been sent in. Pt aware she will need monthly checks and that the device clinic will call her and set these visits up. Pt thanked me for the call.

## 2019-02-25 DIAGNOSIS — Z889 Allergy status to unspecified drugs, medicaments and biological substances status: Secondary | ICD-10-CM | POA: Diagnosis not present

## 2019-03-05 DIAGNOSIS — M546 Pain in thoracic spine: Secondary | ICD-10-CM | POA: Diagnosis not present

## 2019-03-05 DIAGNOSIS — M545 Low back pain: Secondary | ICD-10-CM | POA: Diagnosis not present

## 2019-03-05 DIAGNOSIS — M5134 Other intervertebral disc degeneration, thoracic region: Secondary | ICD-10-CM | POA: Diagnosis not present

## 2019-03-05 DIAGNOSIS — M1712 Unilateral primary osteoarthritis, left knee: Secondary | ICD-10-CM | POA: Diagnosis not present

## 2019-03-05 DIAGNOSIS — G8929 Other chronic pain: Secondary | ICD-10-CM | POA: Diagnosis not present

## 2019-03-05 DIAGNOSIS — M25562 Pain in left knee: Secondary | ICD-10-CM | POA: Diagnosis not present

## 2019-03-05 DIAGNOSIS — Z79899 Other long term (current) drug therapy: Secondary | ICD-10-CM | POA: Diagnosis not present

## 2019-03-12 DIAGNOSIS — J449 Chronic obstructive pulmonary disease, unspecified: Secondary | ICD-10-CM | POA: Diagnosis not present

## 2019-03-17 NOTE — Progress Notes (Signed)
Ubrelvy 100mg  prior authorization submitted via covermymeds.  Waiting response.  [id number]

## 2019-03-18 ENCOUNTER — Ambulatory Visit (INDEPENDENT_AMBULATORY_CARE_PROVIDER_SITE_OTHER): Payer: Medicare Other | Admitting: *Deleted

## 2019-03-18 DIAGNOSIS — I5022 Chronic systolic (congestive) heart failure: Secondary | ICD-10-CM | POA: Diagnosis not present

## 2019-03-18 DIAGNOSIS — I2510 Atherosclerotic heart disease of native coronary artery without angina pectoris: Secondary | ICD-10-CM | POA: Diagnosis not present

## 2019-03-18 DIAGNOSIS — I420 Dilated cardiomyopathy: Secondary | ICD-10-CM

## 2019-03-18 DIAGNOSIS — J9611 Chronic respiratory failure with hypoxia: Secondary | ICD-10-CM | POA: Diagnosis not present

## 2019-03-18 LAB — CUP PACEART REMOTE DEVICE CHECK
Battery Voltage: 2.61 V
Brady Statistic AP VP Percent: 0.05 %
Brady Statistic AP VS Percent: 40.56 %
Brady Statistic AS VP Percent: 0.03 %
Brady Statistic AS VS Percent: 59.36 %
Brady Statistic RA Percent Paced: 39.06 %
Brady Statistic RV Percent Paced: 0.1 %
Date Time Interrogation Session: 20200723122354
HighPow Impedance: 72 Ohm
Implantable Lead Implant Date: 20120502
Implantable Lead Implant Date: 20120502
Implantable Lead Location: 753859
Implantable Lead Location: 753860
Implantable Lead Model: 4076
Implantable Lead Model: 7122
Implantable Pulse Generator Implant Date: 20120502
Lead Channel Impedance Value: 456 Ohm
Lead Channel Impedance Value: 722 Ohm
Lead Channel Pacing Threshold Amplitude: 0.625 V
Lead Channel Pacing Threshold Amplitude: 0.875 V
Lead Channel Pacing Threshold Pulse Width: 0.4 ms
Lead Channel Pacing Threshold Pulse Width: 0.4 ms
Lead Channel Sensing Intrinsic Amplitude: 3.25 mV
Lead Channel Sensing Intrinsic Amplitude: 9.875 mV
Lead Channel Setting Pacing Amplitude: 1.5 V
Lead Channel Setting Pacing Amplitude: 2 V
Lead Channel Setting Pacing Pulse Width: 0.4 ms
Lead Channel Setting Sensing Sensitivity: 0.3 mV

## 2019-03-29 ENCOUNTER — Other Ambulatory Visit: Payer: Self-pay | Admitting: Cardiology

## 2019-03-29 NOTE — Progress Notes (Signed)
Remote ICD transmission.   

## 2019-03-30 NOTE — Progress Notes (Signed)
Rcvd faxed approval from [place]  [phone] This was already approved valid 02/08/2019 - 08/08/2019

## 2019-04-02 DIAGNOSIS — M545 Low back pain: Secondary | ICD-10-CM | POA: Diagnosis not present

## 2019-04-02 DIAGNOSIS — D539 Nutritional anemia, unspecified: Secondary | ICD-10-CM | POA: Diagnosis not present

## 2019-04-02 DIAGNOSIS — Z79899 Other long term (current) drug therapy: Secondary | ICD-10-CM | POA: Diagnosis not present

## 2019-04-02 DIAGNOSIS — M25561 Pain in right knee: Secondary | ICD-10-CM | POA: Diagnosis not present

## 2019-04-02 DIAGNOSIS — Z1159 Encounter for screening for other viral diseases: Secondary | ICD-10-CM | POA: Diagnosis not present

## 2019-04-02 DIAGNOSIS — E559 Vitamin D deficiency, unspecified: Secondary | ICD-10-CM | POA: Diagnosis not present

## 2019-04-02 DIAGNOSIS — E7800 Pure hypercholesterolemia, unspecified: Secondary | ICD-10-CM | POA: Diagnosis not present

## 2019-04-02 DIAGNOSIS — G8929 Other chronic pain: Secondary | ICD-10-CM | POA: Diagnosis not present

## 2019-04-02 DIAGNOSIS — M5136 Other intervertebral disc degeneration, lumbar region: Secondary | ICD-10-CM | POA: Diagnosis not present

## 2019-04-02 DIAGNOSIS — M129 Arthropathy, unspecified: Secondary | ICD-10-CM | POA: Diagnosis not present

## 2019-04-02 DIAGNOSIS — R300 Dysuria: Secondary | ICD-10-CM | POA: Diagnosis not present

## 2019-04-02 DIAGNOSIS — R5383 Other fatigue: Secondary | ICD-10-CM | POA: Diagnosis not present

## 2019-04-05 ENCOUNTER — Telehealth: Payer: Self-pay

## 2019-04-05 NOTE — Telephone Encounter (Signed)
Spoke to pt, she is scheduled to see Dr. Curt Bears 04/26/19 to discuss gen change.

## 2019-04-12 DIAGNOSIS — J449 Chronic obstructive pulmonary disease, unspecified: Secondary | ICD-10-CM | POA: Diagnosis not present

## 2019-04-16 ENCOUNTER — Encounter: Payer: Self-pay | Admitting: Cardiology

## 2019-04-16 ENCOUNTER — Other Ambulatory Visit: Payer: Self-pay

## 2019-04-16 ENCOUNTER — Telehealth (INDEPENDENT_AMBULATORY_CARE_PROVIDER_SITE_OTHER): Payer: Medicare Other | Admitting: Cardiology

## 2019-04-16 VITALS — BP 104/64 | HR 67 | Wt 130.0 lb

## 2019-04-16 DIAGNOSIS — I10 Essential (primary) hypertension: Secondary | ICD-10-CM

## 2019-04-16 DIAGNOSIS — I5022 Chronic systolic (congestive) heart failure: Secondary | ICD-10-CM

## 2019-04-16 DIAGNOSIS — Z95810 Presence of automatic (implantable) cardiac defibrillator: Secondary | ICD-10-CM

## 2019-04-16 DIAGNOSIS — E785 Hyperlipidemia, unspecified: Secondary | ICD-10-CM

## 2019-04-16 DIAGNOSIS — I420 Dilated cardiomyopathy: Secondary | ICD-10-CM

## 2019-04-16 NOTE — Progress Notes (Signed)
Virtual Visit via Video Note   This visit type was conducted due to national recommendations for restrictions regarding the COVID-19 Pandemic (e.g. social distancing) in an effort to limit this patient's exposure and mitigate transmission in our community.  Due to her co-morbid illnesses, this patient is at least at moderate risk for complications without adequate follow up.  This format is felt to be most appropriate for this patient at this time.  All issues noted in this document were discussed and addressed.  A limited physical exam was performed with this format.  Please refer to the patient's chart for her consent to telehealth for [facility].  Evaluation Performed:  Follow-up visit  This visit type was conducted due to national recommendations for restrictions regarding the COVID-19 Pandemic (e.g. social distancing).  This format is felt to be most appropriate for this patient at this time.  All issues noted in this document were discussed and addressed.  No physical exam was performed (except for noted visual exam findings with Video Visits).  Please refer to the patient's chart (MyChart message for video visits and phone note for telephone visits) for the patient's consent to telehealth for [facility].  Date:  04/16/2019  ID: [name], DOB [birth year], MRN [id number]   Patient Location: [address] [place] [address]   Provider location:   [place] Office  PCP:  Lillard Anes, MD  Cardiologist:  Jenne Campus, MD     Chief Complaint: Doing well  History of Present Illness:    [name] is a 63 y.o. female  who presents via audio/video conferencing for a telehealth visit today.  With cardiomyopathy, last estimation of her left ventricular ejection fraction was done in February in form of echocardiogram which showed ejection fraction 30 to 35%, also she had a CT of her head done to have large stroke however, luckily she is  asymptomatic from that standpoint of view.  She does have congestive heart failure New York Heart Association 2, and she is able to walk climb stairs with some difficulties with more strenuous exercise but regular activity of daily living does not drink or any travel.  Denies having any chest pain, no tightness no squeezing no pressure no burning in the chest   The patient does not have symptoms concerning for COVID-19 infection (fever, chills, cough, or new SHORTNESS OF BREATH).    Prior CV studies:   The following studies were reviewed today:  Echocardiogram done in February showed ejection fraction 30 to 35%.     Past Medical History:  Diagnosis Date  . AICD (automatic cardioverter/defibrillator) present 2012   . Arthritis   . Arthritis   . Asthma   . CHF (congestive heart failure) ([place])   . Chronic back pain   . COPD (chronic obstructive pulmonary disease) ([place])   . GERD (gastroesophageal reflux disease)   . Heart failure   . HLD (hyperlipidemia)   . Hypertension   . ICD (implantable cardiac defibrillator) in place 2012  . Left ventricular systolic dysfunction   . Myocardial infarction [facility])    2011 during intestinal blockage surgery  . NICM (nonischemic cardiomyopathy) ([place])   . PTSD (post-traumatic stress disorder)   . PVCs (premature ventricular contractions)   . Thyroid disease     Past Surgical History:  Procedure Laterality Date  . APPENDECTOMY    . CARDIAC CATHETERIZATION    . CARPAL TUNNEL RELEASE    . CESAREAN SECTION    .  CHOLECYSTECTOMY    . CORONARY ANGIOPLASTY    . HAND SURGERY    . ICD IMPLANT     [employer]  . intestinal blockage 2011    . LAPAROSCOPIC ASSISTED VENTRAL HERNIA REPAIR N/A 03/23/2015   Procedure: LAPAROSCOPIC VENTRAL WALL HERNIA REPAIR;  Surgeon: Michael Boston, MD;  Location: [facility];  Service: General;  Laterality: N/A;  With MESH  . LAPAROSCOPIC LYSIS OF ADHESIONS N/A 03/23/2015   Procedure: LAPAROSCOPIC LYSIS OF ADHESIONS;  Surgeon:  Michael Boston, MD;  Location: [facility];  Service: General;  Laterality: N/A;  . NECK SURGERY     fused  . TONSILLECTOMY    . ULNAR NERVE TRANSPOSITION  01/23/2012   Procedure: ULNAR NERVE DECOMPRESSION/TRANSPOSITION;  Surgeon: Ophelia Charter, MD;  Location: [facility];  Service: Neurosurgery;  Laterality: Left;  LEFT ulnar nerve decompression     Current Meds  Medication Sig  . albuterol-ipratropium (COMBIVENT) 18-103 MCG/ACT inhaler Inhale 2 puffs into the lungs every 6 (six) hours as needed. For shortness of breath  . aspirin 81 MG tablet Take 81 mg by mouth daily.    Marland Kitchen BELBUCA 300 MCG FILM Place 1 patch under the tongue every 12 (twelve) hours.  . carvedilol (COREG) 3.125 MG tablet Take 1 tablet (3.125 mg total) by mouth 2 (two) times daily with a meal.  . celecoxib (CELEBREX) 200 MG capsule Take 1 capsule by mouth daily.  . diazepam (VALIUM) 5 MG tablet Take 1 tablet by mouth every 6 (six) hours as needed for anxiety.   Marland Kitchen esomeprazole (NEXIUM) 20 MG capsule Take 20 mg by mouth daily at 12 noon.  . fluticasone (FLONASE) 50 MCG/ACT nasal spray Place 1 spray into both nostrils daily.  . fluticasone furoate-vilanterol (BREO ELLIPTA) 100-25 MCG/INH AEPB Inhale 1 puff into the lungs daily.  . furosemide (LASIX) 40 MG tablet Take 1 tablet (40 mg total) by mouth daily.  Marland Kitchen gabapentin (NEURONTIN) 800 MG tablet Take 1 capsule by mouth 3 (three) times daily.   Marland Kitchen HYDROcodone-acetaminophen (NORCO) 10-325 MG per tablet Take 1-2 tablets by mouth every 4 (four) hours as needed for moderate pain or severe pain. (Patient taking differently: Take 1 tablet by mouth every 6 (six) hours as needed for moderate pain or severe pain. )  . ipratropium (ATROVENT) 0.02 % nebulizer solution Take 0.5 mg by nebulization every 4 (four) hours as needed for wheezing or shortness of breath.  . levocetirizine (XYZAL) 5 MG tablet Take 5 mg by mouth every evening.  . linaclotide (LINZESS) 145 MCG CAPS capsule Take 1 capsule  by mouth daily.  . nitroGLYCERIN (NITROSTAT) 0.4 MG SL tablet Place 1 tablet (0.4 mg total) under the tongue every 5 (five) minutes as needed for chest pain.  . ranolazine (RANEXA) 500 MG 12 hr tablet Take 500 mg by mouth 2 (two) times daily.  . rosuvastatin (CRESTOR) 10 MG tablet Take 10 mg by mouth daily.  . sacubitril-valsartan (ENTRESTO) 24-26 MG Take 1 tablet by mouth 2 (two) times daily.  Marland Kitchen topiramate (TOPAMAX) 50 MG tablet Take 1 tablet (50 mg total) by mouth at bedtime.  Marland Kitchen Ubrogepant (UBRELVY) 100 MG TABS Take 1 tablet by mouth as needed (May repeat 1 tablet after 2 hours if needed.  Maximum 2 tablets in 24 hours).      Family History: The patient's family history includes Alcohol abuse in her father; Cancer in her unknown relative; Heart attack in her father, mother, and unknown relative; Heart failure in her unknown relative;  Hypertension in her father and mother. There is no history of Anesthesia problems, Hypotension, Malignant hyperthermia, or Pseudochol deficiency.   ROS:   Please see the history of present illness.     All other systems reviewed and are negative.   Labs/Other Tests and Data Reviewed:     Recent Labs: 06/23/2018: Hemoglobin 12.7; NT-Pro BNP 1,087; Platelets 458 07/03/2018: BUN 18; Creatinine, Ser 0.90; Potassium 4.6; Sodium 139  Recent Lipid Panel    Component Value Date/Time   CHOL 170 06/30/2017 0000   TRIG 160 (H) 06/30/2017 0000   HDL 64 06/30/2017 0000   CHOLHDL 2.7 06/30/2017 0000   LDLCALC 74 06/30/2017 0000      Exam:    Vital Signs:  BP 104/64   Pulse 67   Wt 130 lb (59 kg)   BMI 24.56 kg/m     Wt Readings from Last 3 Encounters:  04/16/19 130 lb (59 kg)  02/05/19 132 lb (59.9 kg)  02/05/19 130 lb (59 kg)     Well nourished, well developed in no acute distress. Alert oriented x3 with talking over the video link.  She is not in distress without shortness of breath while talking to me  Diagnosis for this visit:   1. Dilated  cardiomyopathy ([place])   2. Chronic systolic congestive heart failure, NYHA class 2 ([place])   3. Essential hypertension   4. Dyslipidemia   5. 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  Dilated cardiomyopathy sadly we have difficulty putting her all appropriate medication because of low blood pressure and simply not feeling well.  Therefore she is on limited medications but seems to be doing clinically fairly well.  Recently the dose of furosemide has been increased because of some swelling of lower extremities but seems to be helping quite a bit. 2.  Essential hypertension with doing with opposite problem blood pressure being low. 3.  Dyslipidemia will continue with atorvastatin.  Last cholesterol profile I have is from June of this year which showed HDL 54 LDL 89 Dual ICD in place.  She is scheduled to see our EP team to talk about replacement since device reached already ERI.  COVID-19 Education: The signs and symptoms of COVID-19 were discussed with the patient and how to seek care for testing (follow up with PCP or arrange E-visit).  The importance of social distancing was discussed today.  Patient Risk:   After full review of this patients clinical status, I feel that they are at least moderate risk at this time.  Time:   Today, I have spent 20 minutes with the patient with telehealth technology discussing pt health issues.  I spent 5 minutes reviewing her chart before the visit.  Visit was finished at 1:54 PM.    Medication Adjustments/Labs and Tests Ordered: Current medicines are reviewed at length with the patient today.  Concerns regarding medicines are outlined above.  No orders of the defined types were placed in this encounter.  Medication changes: No orders of the defined types were placed in this encounter.    Disposition: Follow-up 3 months  Signed, Park Liter, MD, [facility] 04/16/2019 1:52 PM    [place]

## 2019-04-16 NOTE — Patient Instructions (Signed)
Medication Instructions:  Your physician recommends that you continue on your current medications as directed. Please refer to the Current Medication list given to you today.  If you need a refill on your cardiac medications before your next appointment, please call your pharmacy.   Lab work: None.  If you have labs (blood work) drawn today and your tests are completely normal, you will receive your results only by: . MyChart Message (if you have MyChart) OR . A paper copy in the mail If you have any lab test that is abnormal or we need to change your treatment, we will call you to review the results.  Testing/Procedures: None.   Follow-Up: At [facility], you and your health needs are our priority.  As part of our continuing mission to provide you with exceptional heart care, we have created designated Provider Care Teams.  These Care Teams include your primary Cardiologist (physician) and Advanced Practice Providers (APPs -  Physician Assistants and Nurse Practitioners) who all work together to provide you with the care you need, when you need it. You will need a follow up appointment in 3 months.  Please call our office 2 months in advance to schedule this appointment.  You may see No primary care provider on file. or another member of our [facility] Provider Team in [place]: Brian Munley, MD . Rajan Revankar, MD  Any Other Special Instructions Will Be Listed Below (If Applicable).     

## 2019-04-19 ENCOUNTER — Ambulatory Visit (INDEPENDENT_AMBULATORY_CARE_PROVIDER_SITE_OTHER): Payer: Medicare Other | Admitting: *Deleted

## 2019-04-19 DIAGNOSIS — I420 Dilated cardiomyopathy: Secondary | ICD-10-CM

## 2019-04-19 DIAGNOSIS — I5022 Chronic systolic (congestive) heart failure: Secondary | ICD-10-CM

## 2019-04-20 LAB — CUP PACEART REMOTE DEVICE CHECK
Battery Voltage: 2.6 V
Brady Statistic AP VP Percent: 0.02 %
Brady Statistic AP VS Percent: 27.78 %
Brady Statistic AS VP Percent: 0.01 %
Brady Statistic AS VS Percent: 72.19 %
Brady Statistic RA Percent Paced: 26.68 %
Brady Statistic RV Percent Paced: 0.04 %
Date Time Interrogation Session: 20200824212321
HighPow Impedance: 551 Ohm
HighPow Impedance: 85 Ohm
Implantable Lead Implant Date: 20120502
Implantable Lead Implant Date: 20120502
Implantable Lead Location: 753859
Implantable Lead Location: 753860
Implantable Lead Model: 4076
Implantable Lead Model: 7122
Implantable Pulse Generator Implant Date: 20120502
Lead Channel Impedance Value: 456 Ohm
Lead Channel Impedance Value: 760 Ohm
Lead Channel Pacing Threshold Amplitude: 0.75 V
Lead Channel Pacing Threshold Amplitude: 0.875 V
Lead Channel Pacing Threshold Pulse Width: 0.4 ms
Lead Channel Pacing Threshold Pulse Width: 0.4 ms
Lead Channel Sensing Intrinsic Amplitude: 2.375 mV
Lead Channel Sensing Intrinsic Amplitude: 2.375 mV
Lead Channel Sensing Intrinsic Amplitude: 9.125 mV
Lead Channel Sensing Intrinsic Amplitude: 9.125 mV
Lead Channel Setting Pacing Amplitude: 1.5 V
Lead Channel Setting Pacing Amplitude: 2 V
Lead Channel Setting Pacing Pulse Width: 0.4 ms
Lead Channel Setting Sensing Sensitivity: 0.3 mV

## 2019-04-23 DIAGNOSIS — G894 Chronic pain syndrome: Secondary | ICD-10-CM | POA: Diagnosis not present

## 2019-04-23 DIAGNOSIS — M25561 Pain in right knee: Secondary | ICD-10-CM | POA: Diagnosis not present

## 2019-04-23 DIAGNOSIS — M47816 Spondylosis without myelopathy or radiculopathy, lumbar region: Secondary | ICD-10-CM | POA: Diagnosis not present

## 2019-04-23 DIAGNOSIS — M25562 Pain in left knee: Secondary | ICD-10-CM | POA: Diagnosis not present

## 2019-04-23 DIAGNOSIS — G8929 Other chronic pain: Secondary | ICD-10-CM | POA: Diagnosis not present

## 2019-04-26 ENCOUNTER — Ambulatory Visit (INDEPENDENT_AMBULATORY_CARE_PROVIDER_SITE_OTHER): Payer: Medicare Other | Admitting: Cardiology

## 2019-04-26 ENCOUNTER — Other Ambulatory Visit: Payer: Self-pay

## 2019-04-26 ENCOUNTER — Encounter: Payer: Self-pay | Admitting: Cardiology

## 2019-04-26 VITALS — BP 98/62 | HR 80 | Ht 61.0 in | Wt 131.4 lb

## 2019-04-26 DIAGNOSIS — Z95810 Presence of automatic (implantable) cardiac defibrillator: Secondary | ICD-10-CM | POA: Diagnosis not present

## 2019-04-26 DIAGNOSIS — I420 Dilated cardiomyopathy: Secondary | ICD-10-CM

## 2019-04-26 NOTE — Progress Notes (Signed)
Remote ICD transmission.   

## 2019-04-26 NOTE — Patient Instructions (Signed)
Medication Instructions:  Your physician recommends that you continue on your current medications as directed. Please refer to the Current Medication list given to you today.     * If you need a refill on your cardiac medications before your next appointment, please call your pharmacy. *   Labwork: Your physician recommends that you return for pre procedure lab work between: 9/1 - 9/18 * Will notify you of abnormal results, otherwise continue current treatment plan.*   Testing/Procedures: Your physician has recommended that you have a pacemaker battery change. Please follow the instructions below, located under the special instructions section.   Follow-Up: Your physician recommends that you schedule a wound check appointment 10-14 days, after your procedure on 05/21/19, with the device clinic.  Your physician recommends that you schedule a follow up appointment in 91 days, after your procedure on 05/21/19, with Dr. Curt Bears.  * Please note that any paperwork needing to be filled out by the provider will need to be addressed at the front desk prior to seeing the provider.  Please note that any FMLA, disability or other documents regarding health condition is subject to a $25.00 charge that must be received prior to completion of paperwork in the form of a money order or check. *  Thank you for choosing [facility]!!   Trinidad Curet, RN [phone]   Any Other Special Instructions Will Be Listed Below (If Applicable).   Implantable Device Instructions  You are scheduled for: Pacemaker implant on 05/21/19 with Dr. Curt Bears.  1.   Pre procedure testing-             A.  LAB WORK--- On __________ you are scheduled to have blood work any time after 8:00 am.  You do not need to be fasting.               B. COVID TEST-- On 05/18/19 @ 2:00 pm  - You will go to [facility] for your Covid testing.   This is a drive thru test site.  There will be  multiple testing areas.  Be sure to share with the first checkpoint that you are there for pre-procedure/surgery testing. This will put you into the right (yellow) lane that leads to the PAT testing team.   Stay in your car and the nurse team will come to your car to test you.  After you are tested please go home and self quarantine until the day of your procedure.    2. On the day of your procedure 05/21/19 you will go to [facility] [phone]) at 12:00 pm.  [name] will go to the main entrance A [employer]) and enter where the [employer] are.  You will check in at ADMITTING.  You may have one support person come in to the hospital with you.  They will be asked to wait in the waiting room.   3.   Do not eat or drink after midnight prior to your procedure.   4.   On the morning of your procedure do NOT take any medication.  5.  The night before your procedure and the morning of your procedure scrub your neck/chest with surgical scrub.  An instruction letter is included with this letter.   5.  Plan for an overnight stay.  If you use your phone frequently bring your phone charger.  When you are discharged you will need someone to drive you home.  6.  You will follow up with the [facility] clinic 10-14 days after your procedure. You will follow up with Dr. Curt Bears 91 days after your procedure.  These appointments will be made for you.   * If you have ANY questions after you get home, please call the office [phone] and ask for  RN or send a MyChart message.

## 2019-04-26 NOTE — Addendum Note (Signed)
Addended by: Douglass Rivers D on: 04/26/2019 03:32 PM   Modules accepted: Level of Service

## 2019-04-26 NOTE — Progress Notes (Signed)
Electrophysiology Office Note   Date:  04/27/2019   ID:  [name], DOB 11-Nov-1955, MRN [id number]  PCP:  Lillard Anes, MD  Cardiologist: Agustin Cree Primary Electrophysiologist:  Valaree Fresquez Meredith Leeds, MD    No chief complaint on file.    History of Present Illness: [name] is a 63 y.o. female who is being seen today for the evaluation of ischemic cardiomyopathy at the request of Loyal Jacobson. Presenting today for electrophysiology evaluation.  She has a history of coronary artery disease status post MI, ischemic cardiomyopathy with a Medtronic dual-chamber ICD, PVCs, hypertension, and hyperlipidemia.   Today, denies symptoms of palpitations, chest pain, shortness of breath, orthopnea, PND, lower extremity edema, claudication, dizziness, presyncope, syncope, bleeding, or neurologic sequela. The patient is tolerating medications without difficulties.     Past Medical History:  Diagnosis Date  . AICD (automatic cardioverter/defibrillator) present 2012   . Arthritis   . Arthritis   . Asthma   . CHF (congestive heart failure) ([place])   . Chronic back pain   . COPD (chronic obstructive pulmonary disease) ([place])   . GERD (gastroesophageal reflux disease)   . Heart failure   . HLD (hyperlipidemia)   . Hypertension   . ICD (implantable cardiac defibrillator) in place 2012  . Left ventricular systolic dysfunction   . Myocardial infarction [facility])    2011 during intestinal blockage surgery  . NICM (nonischemic cardiomyopathy) ([place])   . PTSD (post-traumatic stress disorder)   . PVCs (premature ventricular contractions)   . Thyroid disease    Past Surgical History:  Procedure Laterality Date  . APPENDECTOMY    . CARDIAC CATHETERIZATION    . CARPAL TUNNEL RELEASE    . CESAREAN SECTION    . CHOLECYSTECTOMY    . CORONARY ANGIOPLASTY    . HAND SURGERY    . ICD IMPLANT     Medtronic  . intestinal blockage 2011    . LAPAROSCOPIC ASSISTED VENTRAL HERNIA REPAIR N/A  03/23/2015   Procedure: LAPAROSCOPIC VENTRAL WALL HERNIA REPAIR;  Surgeon: Michael Boston, MD;  Location: [facility];  Service: General;  Laterality: N/A;  With MESH  . LAPAROSCOPIC LYSIS OF ADHESIONS N/A 03/23/2015   Procedure: LAPAROSCOPIC LYSIS OF ADHESIONS;  Surgeon: Michael Boston, MD;  Location: [facility];  Service: General;  Laterality: N/A;  . NECK SURGERY     fused  . TONSILLECTOMY    . ULNAR NERVE TRANSPOSITION  01/23/2012   Procedure: ULNAR NERVE DECOMPRESSION/TRANSPOSITION;  Surgeon: Ophelia Charter, MD;  Location: [place] [facility];  Service: Neurosurgery;  Laterality: Left;  LEFT ulnar nerve decompression     Current Outpatient Medications  Medication Sig Dispense Refill  . albuterol-ipratropium (COMBIVENT) 18-103 MCG/ACT inhaler Inhale 2 puffs into the lungs every 6 (six) hours as needed. For shortness of breath    . aspirin 81 MG tablet Take 81 mg by mouth daily.      Marland Kitchen BELBUCA 300 MCG FILM Place 1 patch under the tongue every 12 (twelve) hours.    . carvedilol (COREG) 3.125 MG tablet Take 1 tablet (3.125 mg total) by mouth 2 (two) times daily with a meal. 180 tablet 3  . celecoxib (CELEBREX) 200 MG capsule Take 1 capsule by mouth daily.  3  . diazepam (VALIUM) 5 MG tablet Take 1 tablet by mouth every 6 (six) hours as needed for anxiety.   3  . esomeprazole (NEXIUM) 20 MG capsule Take 20 mg by mouth daily at 12 noon.    Marland Kitchen  fluticasone (FLONASE) 50 MCG/ACT nasal spray Place 1 spray into both nostrils daily.    . fluticasone furoate-vilanterol (BREO ELLIPTA) 100-25 MCG/INH AEPB Inhale 1 puff into the lungs daily.    . furosemide (LASIX) 40 MG tablet Take 1 tablet (40 mg total) by mouth daily. 30 tablet 3  . gabapentin (NEURONTIN) 800 MG tablet Take 1 capsule by mouth 3 (three) times daily.   2  . HYDROcodone-acetaminophen (NORCO) 10-325 MG per tablet Take 1-2 tablets by mouth every 4 (four) hours as needed for moderate pain or severe pain. 40 tablet 0  . ipratropium (ATROVENT) 0.02 % nebulizer  solution Take 0.5 mg by nebulization every 4 (four) hours as needed for wheezing or shortness of breath.    . levocetirizine (XYZAL) 5 MG tablet Take 5 mg by mouth every evening.    . linaclotide (LINZESS) 145 MCG CAPS capsule Take 1 capsule by mouth daily.    . nitroGLYCERIN (NITROSTAT) 0.4 MG SL tablet Place 1 tablet (0.4 mg total) under the tongue every 5 (five) minutes as needed for chest pain. 25 tablet 6  . ranolazine (RANEXA) 500 MG 12 hr tablet Take 500 mg by mouth 2 (two) times daily.    . rosuvastatin (CRESTOR) 10 MG tablet Take 10 mg by mouth daily.    . sacubitril-valsartan (ENTRESTO) 24-26 MG Take 1 tablet by mouth 2 (two) times daily. 180 tablet 1  . topiramate (TOPAMAX) 50 MG tablet Take 1 tablet (50 mg total) by mouth at bedtime. 30 tablet 3  . Ubrogepant (UBRELVY) 100 MG TABS Take 1 tablet by mouth as needed (May repeat 1 tablet after 2 hours if needed.  Maximum 2 tablets in 24 hours). 16 tablet 3   No current facility-administered medications for this visit.     Allergies:   Patient has no known allergies.   Social History:  The patient  reports that she quit smoking about 8 years ago. Her smoking use included cigarettes. She has a 30.00 pack-year smoking history. She has never used smokeless tobacco. She reports that she does not drink alcohol or use drugs.   Family History:  The patient's family history includes Alcohol abuse in her father; Cancer in her unknown relative; Heart attack in her father, mother, and unknown relative; Heart failure in her unknown relative; Hypertension in her father and mother.    ROS:  Please see the history of present illness.   Otherwise, review of systems is positive for weakness, fatigue.   All other systems are reviewed and negative.    PHYSICAL EXAM: VS:  BP 98/62   Pulse 80   Ht 5\' 1"  (1.549 m)   Wt 131 lb 6.4 oz (59.6 kg)   SpO2 95%   BMI 24.83 kg/m  , BMI Body mass index is 24.83 kg/m. GEN: Well nourished, well developed, in no  acute distress  HEENT: normal  Neck: no JVD, carotid bruits, or masses Cardiac: iRRR; no murmurs, rubs, or gallops,no edema  Respiratory:  clear to auscultation bilaterally, normal work of breathing GI: soft, nontender, nondistended, + BS MS: no deformity or atrophy  Skin: warm and dry, device pocket is well healed Neuro:  Strength and sensation are intact Psych: euthymic mood, full affect  EKG:  EKG is ordered today. Personal review of the ekg ordered shows this rhythm, frequent PVCs  Device interrogation is reviewed today in detail.  See PaceArt for details.   Recent Labs: 06/23/2018: Hemoglobin 12.7; NT-Pro BNP 1,087; Platelets 458 07/03/2018: BUN 18;  Creatinine, Ser 0.90; Potassium 4.6; Sodium 139    Lipid Panel     Component Value Date/Time   CHOL 170 06/30/2017 0000   TRIG 160 (H) 06/30/2017 0000   HDL 64 06/30/2017 0000   CHOLHDL 2.7 06/30/2017 0000   LDLCALC 74 06/30/2017 0000     Wt Readings from Last 3 Encounters:  04/26/19 131 lb 6.4 oz (59.6 kg)  04/16/19 130 lb (59 kg)  02/05/19 132 lb (59.9 kg)      Other studies Reviewed: Additional studies/ records that were reviewed today include: TTE 06/2017  Review of the above records today demonstrates:  - Left ventricle: The cavity size was normal. Systolic function was   moderately to severely reduced. The estimated ejection fraction   was in the range of 30% to 35%. Hypokinesis of the inferoseptal   myocardium. Doppler parameters are consistent with abnormal left   ventricular relaxation (grade 1 diastolic dysfunction). - Aortic valve: Valve area (VTI): 1.95 cm^2. Valve area (Vmax):   1.85 cm^2. Valve area (Vmean): 1.98 cm^2. - Mitral valve: There was mild regurgitation. - Left atrium: The atrium was mildly dilated.   ASSESSMENT AND PLAN:  1.  Ischemic cardiomyopathy: Currently on optimal medical therapy status post Medtronic dual-chamber ICD.  On optimal medical therapy.  Device is reached ERI.  We Shawan Corella  plan for generator change.  Risks and benefits were discussed and include bleeding and infection.  The patient understands these risks and has agreed to the procedure.  2.  Hypertension: Blood pressure low today.  No changes.  3.  Hyperlipidemia: Statin per primary care  4.  Coronary artery disease: no current chest pain  5.  PVCs: 4% PVCs on monitor. Delorean Knutzen continue with current management.  Current medicines are reviewed at length with the patient today.   The patient does not have concerns regarding her medicines.  The following changes were made today: None  Labs/ tests ordered today include:  Orders Placed This Encounter  Procedures  . EKG 12-Lead    Disposition:   FU with Madolin Twaddle 3 months  Signed, Lenetta Piche Meredith Leeds, MD  04/27/2019 8:10 AM     [facility] [address] [place] [address] [phone] (office) [phone] (fax)

## 2019-04-28 ENCOUNTER — Telehealth: Payer: Self-pay | Admitting: *Deleted

## 2019-04-28 ENCOUNTER — Telehealth: Payer: Self-pay | Admitting: Cardiology

## 2019-04-28 DIAGNOSIS — Z01812 Encounter for preprocedural laboratory examination: Secondary | ICD-10-CM

## 2019-04-28 DIAGNOSIS — I255 Ischemic cardiomyopathy: Secondary | ICD-10-CM

## 2019-04-28 NOTE — Telephone Encounter (Signed)
Her foot is still swelling

## 2019-04-28 NOTE — Telephone Encounter (Signed)
Pt will stop by the [facility] office Friday, 9/4 for pre procedure blood work. She understands to call the office when she gets there.

## 2019-04-28 NOTE — Telephone Encounter (Signed)
Called patient. She reports left foot swelling for over 1 month. She is swollen from her foot up to her knee. She denies any shortness of breath, pain, or redness to the leg. She had informed Dr. Agustin Cree of this a couple weeks ago and wanted him to know it is still continuing.

## 2019-04-29 DIAGNOSIS — I255 Ischemic cardiomyopathy: Secondary | ICD-10-CM | POA: Diagnosis not present

## 2019-04-29 DIAGNOSIS — Z01812 Encounter for preprocedural laboratory examination: Secondary | ICD-10-CM | POA: Diagnosis not present

## 2019-04-29 NOTE — Addendum Note (Signed)
Addended by: Aleatha Borer on: 04/29/2019 01:17 PM   Modules accepted: Orders

## 2019-04-29 NOTE — Telephone Encounter (Signed)
Returned call

## 2019-04-30 DIAGNOSIS — Z79899 Other long term (current) drug therapy: Secondary | ICD-10-CM | POA: Diagnosis not present

## 2019-04-30 DIAGNOSIS — M5136 Other intervertebral disc degeneration, lumbar region: Secondary | ICD-10-CM | POA: Diagnosis not present

## 2019-04-30 DIAGNOSIS — M545 Low back pain: Secondary | ICD-10-CM | POA: Diagnosis not present

## 2019-04-30 DIAGNOSIS — G8929 Other chronic pain: Secondary | ICD-10-CM | POA: Diagnosis not present

## 2019-04-30 DIAGNOSIS — M419 Scoliosis, unspecified: Secondary | ICD-10-CM | POA: Diagnosis not present

## 2019-04-30 LAB — BASIC METABOLIC PANEL
BUN/Creatinine Ratio: 18 (ref 12–28)
BUN: 14 mg/dL (ref 8–27)
CO2: 23 mmol/L (ref 20–29)
Calcium: 9.3 mg/dL (ref 8.7–10.3)
Chloride: 104 mmol/L (ref 96–106)
Creatinine, Ser: 0.8 mg/dL (ref 0.57–1.00)
GFR calc Af Amer: 90 mL/min/{1.73_m2} (ref >59)
GFR calc non Af Amer: 78 mL/min/{1.73_m2} (ref >59)
Glucose: 108 mg/dL — ABNORMAL HIGH (ref 65–99)
Potassium: 3.9 mmol/L (ref 3.5–5.2)
Sodium: 139 mmol/L (ref 134–144)

## 2019-04-30 LAB — CBC
Hematocrit: 32.9 % — ABNORMAL LOW (ref 34.0–46.6)
Hemoglobin: 10.8 g/dL — ABNORMAL LOW (ref 11.1–15.9)
MCH: 30.4 pg (ref 26.6–33.0)
MCHC: 32.8 g/dL (ref 31.5–35.7)
MCV: 93 fL (ref 79–97)
Platelets: 307 10*3/uL (ref 150–450)
RBC: 3.55 x10E6/uL — ABNORMAL LOW (ref 3.77–5.28)
RDW: 12.2 % (ref 11.7–15.4)
WBC: 8.6 10*3/uL (ref 3.4–10.8)

## 2019-05-04 ENCOUNTER — Telehealth: Payer: Self-pay | Admitting: *Deleted

## 2019-05-04 NOTE — Telephone Encounter (Signed)
-----   Message from Crystal Meredith Leeds, MD sent at 04/30/2019  7:33 AM EDT ----- Stable preop labs Anemia should be worked up by PCP

## 2019-05-04 NOTE — Telephone Encounter (Signed)
lmtcb

## 2019-05-05 DIAGNOSIS — M1711 Unilateral primary osteoarthritis, right knee: Secondary | ICD-10-CM | POA: Diagnosis not present

## 2019-05-05 DIAGNOSIS — M25561 Pain in right knee: Secondary | ICD-10-CM | POA: Diagnosis not present

## 2019-05-05 DIAGNOSIS — M25532 Pain in left wrist: Secondary | ICD-10-CM | POA: Diagnosis not present

## 2019-05-05 DIAGNOSIS — M79642 Pain in left hand: Secondary | ICD-10-CM | POA: Diagnosis not present

## 2019-05-05 DIAGNOSIS — S80219A Abrasion, unspecified knee, initial encounter: Secondary | ICD-10-CM | POA: Diagnosis not present

## 2019-05-05 NOTE — Telephone Encounter (Signed)
Left message for patient to return call.

## 2019-05-06 ENCOUNTER — Other Ambulatory Visit: Payer: Self-pay | Admitting: Cardiology

## 2019-05-06 NOTE — Telephone Encounter (Signed)
Left message for patient to return call.

## 2019-05-10 NOTE — Telephone Encounter (Signed)
Informed patient of results and verbal understanding expressed. Aware I will forward to PCP to follow up with/discuss. Patient verbalized understanding and agreeable to plan.

## 2019-05-11 NOTE — Telephone Encounter (Signed)
Left another message for patient to return call. Will remove from work que.

## 2019-05-13 DIAGNOSIS — I5022 Chronic systolic (congestive) heart failure: Secondary | ICD-10-CM | POA: Diagnosis not present

## 2019-05-13 DIAGNOSIS — J449 Chronic obstructive pulmonary disease, unspecified: Secondary | ICD-10-CM | POA: Diagnosis not present

## 2019-05-13 DIAGNOSIS — M810 Age-related osteoporosis without current pathological fracture: Secondary | ICD-10-CM | POA: Diagnosis not present

## 2019-05-13 DIAGNOSIS — E782 Mixed hyperlipidemia: Secondary | ICD-10-CM | POA: Diagnosis not present

## 2019-05-13 DIAGNOSIS — I10 Essential (primary) hypertension: Secondary | ICD-10-CM | POA: Diagnosis not present

## 2019-05-18 ENCOUNTER — Other Ambulatory Visit (HOSPITAL_COMMUNITY)
Admission: RE | Admit: 2019-05-18 | Discharge: 2019-05-18 | Disposition: A | Discharge status: Home / Self Care | Payer: Medicare Other | Source: Ambulatory Visit | Attending: Cardiology | Admitting: Cardiology

## 2019-05-18 DIAGNOSIS — Z01812 Encounter for preprocedural laboratory examination: Secondary | ICD-10-CM | POA: Diagnosis not present

## 2019-05-18 DIAGNOSIS — Z20828 Contact with and (suspected) exposure to other viral communicable diseases: Secondary | ICD-10-CM | POA: Insufficient documentation

## 2019-05-19 DIAGNOSIS — I2510 Atherosclerotic heart disease of native coronary artery without angina pectoris: Secondary | ICD-10-CM | POA: Diagnosis not present

## 2019-05-19 DIAGNOSIS — J9611 Chronic respiratory failure with hypoxia: Secondary | ICD-10-CM | POA: Diagnosis not present

## 2019-05-19 DIAGNOSIS — I5022 Chronic systolic (congestive) heart failure: Secondary | ICD-10-CM | POA: Diagnosis not present

## 2019-05-19 LAB — NOVEL CORONAVIRUS, NAA (HOSP ORDER, SEND-OUT TO REF LAB; TAT 18-24 HRS): SARS-CoV-2, NAA: NOT DETECTED

## 2019-05-19 NOTE — Telephone Encounter (Signed)
Pt aware schedule change at hospital this Friday. Aware to arrive at 10:00 am  Patient verbalized understanding and agreeable to plan.

## 2019-05-21 ENCOUNTER — Ambulatory Visit (HOSPITAL_COMMUNITY)
Admission: RE | Admit: 2019-05-21 | Discharge: 2019-05-21 | Disposition: A | Discharge status: Home / Self Care | Payer: Medicare Other | Attending: Cardiology | Admitting: Cardiology

## 2019-05-21 ENCOUNTER — Encounter (HOSPITAL_COMMUNITY)
Admission: RE | Disposition: A | Discharge status: Home / Self Care | Payer: Medicare Other | Source: Home / Self Care | Attending: Cardiology

## 2019-05-21 ENCOUNTER — Encounter (HOSPITAL_COMMUNITY): Payer: Self-pay | Admitting: Cardiology

## 2019-05-21 ENCOUNTER — Other Ambulatory Visit: Payer: Self-pay

## 2019-05-21 DIAGNOSIS — Z87891 Personal history of nicotine dependence: Secondary | ICD-10-CM | POA: Diagnosis not present

## 2019-05-21 DIAGNOSIS — I255 Ischemic cardiomyopathy: Secondary | ICD-10-CM | POA: Diagnosis not present

## 2019-05-21 DIAGNOSIS — I110 Hypertensive heart disease with heart failure: Secondary | ICD-10-CM | POA: Diagnosis not present

## 2019-05-21 DIAGNOSIS — Z45010 Encounter for checking and testing of cardiac pacemaker pulse generator [battery]: Secondary | ICD-10-CM | POA: Insufficient documentation

## 2019-05-21 DIAGNOSIS — Z7982 Long term (current) use of aspirin: Secondary | ICD-10-CM | POA: Diagnosis not present

## 2019-05-21 DIAGNOSIS — I2510 Atherosclerotic heart disease of native coronary artery without angina pectoris: Secondary | ICD-10-CM | POA: Insufficient documentation

## 2019-05-21 DIAGNOSIS — Z006 Encounter for examination for normal comparison and control in clinical research program: Secondary | ICD-10-CM | POA: Insufficient documentation

## 2019-05-21 DIAGNOSIS — J449 Chronic obstructive pulmonary disease, unspecified: Secondary | ICD-10-CM | POA: Insufficient documentation

## 2019-05-21 DIAGNOSIS — G8929 Other chronic pain: Secondary | ICD-10-CM | POA: Insufficient documentation

## 2019-05-21 DIAGNOSIS — I509 Heart failure, unspecified: Secondary | ICD-10-CM | POA: Insufficient documentation

## 2019-05-21 DIAGNOSIS — Z79899 Other long term (current) drug therapy: Secondary | ICD-10-CM | POA: Diagnosis not present

## 2019-05-21 DIAGNOSIS — E785 Hyperlipidemia, unspecified: Secondary | ICD-10-CM | POA: Insufficient documentation

## 2019-05-21 DIAGNOSIS — M1990 Unspecified osteoarthritis, unspecified site: Secondary | ICD-10-CM | POA: Insufficient documentation

## 2019-05-21 DIAGNOSIS — Z95810 Presence of automatic (implantable) cardiac defibrillator: Secondary | ICD-10-CM | POA: Insufficient documentation

## 2019-05-21 DIAGNOSIS — K219 Gastro-esophageal reflux disease without esophagitis: Secondary | ICD-10-CM | POA: Insufficient documentation

## 2019-05-21 DIAGNOSIS — Z4502 Encounter for adjustment and management of automatic implantable cardiac defibrillator: Secondary | ICD-10-CM | POA: Diagnosis not present

## 2019-05-21 DIAGNOSIS — I493 Ventricular premature depolarization: Secondary | ICD-10-CM | POA: Diagnosis not present

## 2019-05-21 DIAGNOSIS — I252 Old myocardial infarction: Secondary | ICD-10-CM | POA: Insufficient documentation

## 2019-05-21 HISTORY — PX: ICD GENERATOR CHANGEOUT: EP1231

## 2019-05-21 LAB — SURGICAL PCR SCREEN
MRSA, PCR: NEGATIVE
Staphylococcus aureus: NEGATIVE

## 2019-05-21 SURGERY — ICD GENERATOR CHANGEOUT
Anesthesia: LOCAL

## 2019-05-21 MED ORDER — LIDOCAINE HCL (PF) 1 % IJ SOLN
INTRAMUSCULAR | Status: DC | PRN
  Administered 2019-05-21: 60 mL

## 2019-05-21 MED ORDER — CEFAZOLIN SODIUM-DEXTROSE 2-4 GM/100ML-% IV SOLN
INTRAVENOUS | Status: AC
  Filled 2019-05-21: qty 100

## 2019-05-21 MED ORDER — ONDANSETRON HCL 4 MG/2ML IJ SOLN: 4.0000 mg | Freq: Four times a day (QID) | INTRAMUSCULAR | Status: DC | PRN

## 2019-05-21 MED ORDER — CEFAZOLIN SODIUM-DEXTROSE 2-4 GM/100ML-% IV SOLN
2.0000 g | INTRAVENOUS | Status: AC
  Administered 2019-05-21: 2 g via INTRAVENOUS

## 2019-05-21 MED ORDER — FENTANYL CITRATE (PF) 100 MCG/2ML IJ SOLN
INTRAMUSCULAR | Status: DC | PRN
  Administered 2019-05-21: 12.5 ug via INTRAVENOUS
  Administered 2019-05-21: 25 ug via INTRAVENOUS
  Administered 2019-05-21: 12.5 ug via INTRAVENOUS
  Administered 2019-05-21: 25 ug via INTRAVENOUS

## 2019-05-21 MED ORDER — SODIUM CHLORIDE 0.9 % IV SOLN
INTRAVENOUS | Status: DC
  Administered 2019-05-21: 11:00:00 via INTRAVENOUS

## 2019-05-21 MED ORDER — LIDOCAINE HCL (PF) 1 % IJ SOLN
INTRAMUSCULAR | Status: AC
  Filled 2019-05-21: qty 60

## 2019-05-21 MED ORDER — SODIUM CHLORIDE 0.9 % IV SOLN
INTRAVENOUS | Status: AC
  Filled 2019-05-21: qty 2

## 2019-05-21 MED ORDER — MUPIROCIN 2 % EX OINT
1.0000 "application " | TOPICAL_OINTMENT | Freq: Once | CUTANEOUS | Status: AC
  Administered 2019-05-21: 1 via TOPICAL
  Filled 2019-05-21: qty 22

## 2019-05-21 MED ORDER — CHLORHEXIDINE GLUCONATE 4 % EX LIQD: 60.0000 mL | Freq: Once | CUTANEOUS | Status: DC

## 2019-05-21 MED ORDER — MIDAZOLAM HCL 5 MG/5ML IJ SOLN
INTRAMUSCULAR | Status: AC
  Filled 2019-05-21: qty 5

## 2019-05-21 MED ORDER — SODIUM CHLORIDE 0.9 % IV SOLN
80.0000 mg | INTRAVENOUS | Status: AC
  Administered 2019-05-21: 80 mg

## 2019-05-21 MED ORDER — FENTANYL CITRATE (PF) 100 MCG/2ML IJ SOLN
INTRAMUSCULAR | Status: AC
  Filled 2019-05-21: qty 2

## 2019-05-21 MED ORDER — MIDAZOLAM HCL 5 MG/5ML IJ SOLN
INTRAMUSCULAR | Status: DC | PRN
  Administered 2019-05-21: 0.5 mg via INTRAVENOUS
  Administered 2019-05-21: 1 mg via INTRAVENOUS
  Administered 2019-05-21: 0.5 mg via INTRAVENOUS
  Administered 2019-05-21: 1 mg via INTRAVENOUS

## 2019-05-21 MED ORDER — ACETAMINOPHEN 325 MG PO TABS
325.0000 mg | ORAL_TABLET | ORAL | Status: DC | PRN
  Filled 2019-05-21: qty 2

## 2019-05-21 SURGICAL SUPPLY — 5 items
CABLE SURGICAL S-101-97-12 (CABLE) ×2 IMPLANT
ICD EVERA XT MRI DF1  DDMB1D1 (ICD Generator) ×1 IMPLANT
ICD EVERA XT MRI DF1 DDMB1D1 (ICD Generator) IMPLANT
PAD PRO RADIOLUCENT 2001M-C (PAD) ×2 IMPLANT
TRAY PACEMAKER INSERTION (PACKS) ×2 IMPLANT

## 2019-05-21 NOTE — H&P (Signed)
ICD Criteria  Current LVEF:30-35%. Within 12 months prior to implant: Yes   Heart failure history: Yes, Class II  Cardiomyopathy history: Yes, Ischemic Cardiomyopathy - Prior MI.  Atrial Fibrillation/Atrial Flutter: No.  Ventricular tachycardia history: No.  Cardiac arrest history: No.  History of syndromes with risk of sudden death: No.  Previous ICD: Yes, Reason for ICD:  Primary prevention.  Current ICD indication: Primary  PPM indication: No.  Class I or II Bradycardia indication present: No  Beta Blocker therapy for 3 or more months: Yes, prescribed.   Ace Inhibitor/ARB therapy for 3 or more months: Yes, prescribed.    I have seen [name] is a 63 y.o. femalepre-procedural and has been referred by Agustin Cree for consideration of ICD implant for primary prevention of sudden death.  The patient's chart has been reviewed and they meet criteria for ICD implant.  I have had a thorough discussion with the patient reviewing options.  The patient and their family (if available) have had opportunities to ask questions and have them answered. The patient and I have decided together through the [place] [employer] to generator change ICD at this time.  Risks, benefits, alternatives to ICD implantation were discussed in detail with the patient today. The patient  understands that the risks include but are not limited to bleeding, infection, pneumothorax, perforation, tamponade, vascular damage, renal failure, MI, stroke, death, inappropriate shocks, and lead dislodgement and  wishes to proceed.

## 2019-05-21 NOTE — Discharge Instructions (Signed)

## 2019-05-26 ENCOUNTER — Encounter (HOSPITAL_COMMUNITY): Payer: Self-pay

## 2019-05-26 DIAGNOSIS — I10 Essential (primary) hypertension: Secondary | ICD-10-CM | POA: Diagnosis not present

## 2019-05-26 DIAGNOSIS — E782 Mixed hyperlipidemia: Secondary | ICD-10-CM | POA: Diagnosis not present

## 2019-05-26 DIAGNOSIS — I5022 Chronic systolic (congestive) heart failure: Secondary | ICD-10-CM | POA: Diagnosis not present

## 2019-05-26 DIAGNOSIS — Z23 Encounter for immunization: Secondary | ICD-10-CM | POA: Diagnosis not present

## 2019-06-03 ENCOUNTER — Ambulatory Visit: Payer: Medicare Other

## 2019-06-05 ENCOUNTER — Other Ambulatory Visit: Payer: Self-pay | Admitting: Neurology

## 2019-06-07 ENCOUNTER — Telehealth: Payer: Self-pay | Admitting: Cardiology

## 2019-06-07 NOTE — Telephone Encounter (Signed)
Requested Prescriptions   Pending Prescriptions Disp Refills  . topiramate (TOPAMAX) 50 MG tablet [Pharmacy Med Name: TOPIRAMATE 50MG  TABLETS] 30 tablet 3    Sig: TAKE 1 TABLET(50 MG) BY MOUTH AT BEDTIME   Rx last filled: 02/05/19 #30 3 refills  Pt last seen:02/05/19  Follow up appt scheduled:06/11/19

## 2019-06-07 NOTE — Telephone Encounter (Signed)
Has her pacemaker batteries replaced and wants RJK to check her wound site

## 2019-06-08 NOTE — Progress Notes (Signed)
NEUROLOGY FOLLOW UP OFFICE NOTE  [name] [id number]  HISTORY OF PRESENT ILLNESS: [name] is a 63 year old Caucasian woman with CHF with EF 30-35%, s/p AICD, COPD, chronic back pain, chronic knee pain, arthritis, HTN, hyperlipidemia and PTSD and history of MI and former smoker and history of childhood seizures who follows up for migraines and dizziness.  UPDATE: Started on topiramate.  Advised to stop sumatriptan and instead take Ubrelvy.  She now states she is getting jerking movements of the right hand when she writes.  Gabapentin recently increased.  She also reports eyes are getting blurry.  She has had this for awhile but getting worse.  She has not seen an eye doctor.  However, she is still taking sumatriptan daily.   Intensity:  severe Duration:  All day.  1 hour if takes [place] immediately.  Usually waits to take Ubrelvy.  Often takes sumatriptan.   Frequency:  20 days a month Frequency of abortive medication:sumatriptan daily Current NSAIDS:  ASA 81mg ; Celebrex Current analgesics:  none Current triptans:  sumatriptan 25mg   Current ergotamine:  none Current anti-emetic:  none Current muscle relaxants:  none Current anti-anxiolytic:  Valium Current sleep aide:  none Current Antihypertensive medications:  Coreg, Lasix Current Antidepressant medications:  none Current Anticonvulsant medications:  Topiramate 50mg  at bedtime; gabapentin 800mg  twice daily Current anti-CGRP:  Ubrelvy 100mg  Current Vitamins/Herbal/Supplements:  none Current Antihistamines/Decongestants:  Flonase Other therapy:  none Hormone/birth control:  none  Caffeine:  3 cups of coffee daily Alcohol:  no Smoker: former Diet: 100 oz water daily.  No soda.  Exercise: no  HISTORY: She has had headaches since around 2017 but gradually got worse, particularly since November 2019.  They are severe bifrontal pounding/squeezing headache.  There is no aura.  They are associated with photophobia,  phonophobia, blurred vision and dizziness.  They typically last 1 hour and occur 5 to 6 a day every day.  They are triggered by loud noise.  Sumatriptan helps relieve the intensity but does not abort it..  Since November 2019, she also has had episodes of dizziness outside of the dizziness as well, causing her to fall.  She reports sudden spinning sensation lasting a few seconds and resolves.  She says it is not positional.  She has associated nausea and blurred vision.  She says it occurs 2 to 3 times a day.    She had a CT of head without contrast on 11/02/18 which was personally reviewed and was unremarkable, demonstrating mild atrophy and mild chronic small vessel ischemic changes but no acute intracranial abnormality.  She is treated by pain management for chronic pain.  She has degenerative disc disease of the cervical spine with previous neck fusion.  She has history of total right hip replacement.  She has chronic low back pain with radicular symptoms.  She has bilateral knee pain.  Past NSAIDS:  none Past analgesics:  Tylenol (contraindicated due to CHF) Past abortive triptans:  none Past abortive ergotamine:  none Past muscle relaxants:  none Past anti-emetic:  none Past antihypertensive medications:  lisinopril Past antidepressant medications:  none Past anticonvulsant medications:  none Past anti-CGRP:  none Past vitamins/Herbal/Supplements:  none Past antihistamines/decongestants:  none Other past therapies:  none  PAST MEDICAL HISTORY: Past Medical History:  Diagnosis Date  . AICD (automatic cardioverter/defibrillator) present 2012   . Arthritis   . Arthritis   . Asthma   . CHF (congestive heart failure) ([place])   . Chronic back pain   .  COPD (chronic obstructive pulmonary disease) ([place])   . GERD (gastroesophageal reflux disease)   . Heart failure   . HLD (hyperlipidemia)   . Hypertension   . ICD (implantable cardiac defibrillator) in place 2012  . Left ventricular  systolic dysfunction   . Myocardial infarction [facility])    2011 during intestinal blockage surgery  . NICM (nonischemic cardiomyopathy) ([facility])   . PTSD (post-traumatic stress disorder)   . PVCs (premature ventricular contractions)   . Thyroid disease     MEDICATIONS: Current Outpatient Medications on File Prior to Visit  Medication Sig Dispense Refill  . albuterol-ipratropium (COMBIVENT) 18-103 MCG/ACT inhaler Inhale 2 puffs into the lungs every 6 (six) hours as needed for wheezing or shortness of breath.     Marland Kitchen alendronate (FOSAMAX) 70 MG tablet Take 70 mg by mouth every Monday. Take with a full glass of water on an empty stomach.    Marland Kitchen aspirin 81 MG tablet Take 81 mg by mouth daily.      . Buprenorphine HCl (BELBUCA) 900 MCG FILM Place 900 mcg inside cheek every 12 (twelve) hours.    . carvedilol (COREG) 3.125 MG tablet Take 1 tablet (3.125 mg total) by mouth 2 (two) times daily with a meal. 180 tablet 3  . celecoxib (CELEBREX) 200 MG capsule Take 200 mg by mouth daily.   3  . ENTRESTO 24-26 MG TAKE 1 TABLET BY MOUTH TWICE DAILY (Patient taking differently: Take 1 tablet by mouth 2 (two) times daily. ) 180 tablet 1  . esomeprazole (NEXIUM) 20 MG capsule Take 20 mg by mouth daily.     . fluticasone (FLONASE) 50 MCG/ACT nasal spray Place 2 sprays into both nostrils daily as needed (congestion).     . fluticasone furoate-vilanterol (BREO ELLIPTA) 100-25 MCG/INH AEPB Inhale 1 puff into the lungs daily as needed (shortness).     . furosemide (LASIX) 40 MG tablet Take 1 tablet (40 mg total) by mouth daily. (Patient taking differently: Take 40 mg by mouth 2 (two) times daily. ) 30 tablet 3  . gabapentin (NEURONTIN) 800 MG tablet Take 800 mg by mouth 3 (three) times daily.   2  . HYDROcodone-acetaminophen (NORCO) 10-325 MG per tablet Take 1-2 tablets by mouth every 4 (four) hours as needed for moderate pain or severe pain. (Patient taking differently: Take 1 tablet by mouth 4 (four) times daily. ) 40  tablet 0  . ipratropium (ATROVENT) 0.02 % nebulizer solution Take 0.5 mg by nebulization every 4 (four) hours as needed for wheezing or shortness of breath.    . linaclotide (LINZESS) 145 MCG CAPS capsule Take 145 mcg by mouth daily.     . nitroGLYCERIN (NITROSTAT) 0.4 MG SL tablet Place 1 tablet (0.4 mg total) under the tongue every 5 (five) minutes as needed for chest pain. 25 tablet 6  . ranolazine (RANEXA) 500 MG 12 hr tablet Take 500 mg by mouth 2 (two) times daily.     . rosuvastatin (CRESTOR) 20 MG tablet Take 20 mg by mouth daily.    . SUMAtriptan (IMITREX) 25 MG tablet Take 25 mg by mouth every 2 (two) hours as needed for migraine. May repeat in 2 hours if headache persists or recurs.    . topiramate (TOPAMAX) 50 MG tablet TAKE 1 TABLET(50 MG) BY MOUTH AT BEDTIME 30 tablet 3  . Ubrogepant (UBRELVY) 100 MG TABS Take 1 tablet by mouth as needed (May repeat 1 tablet after 2 hours if needed.  Maximum 2 tablets in 24  hours). (Patient taking differently: Take 100 mg by mouth as needed (migraines). May repeat 1 tablet after 2 hours if needed.  Maximum 2 tablets in 24 hours) 16 tablet 3   No current facility-administered medications on file prior to visit.     ALLERGIES: No Known Allergies  FAMILY HISTORY: Family History  Problem Relation Age of Onset  . Heart attack Other   . Cancer Other   . Heart failure Other   . Hypertension Mother   . Heart attack Mother   . Alcohol abuse Father   . Hypertension Father   . Heart attack Father   . Anesthesia problems Neg Hx   . Hypotension Neg Hx   . Malignant hyperthermia Neg Hx   . Pseudochol deficiency Neg Hx    SOCIAL HISTORY: Social History   Socioeconomic History  . Marital status: Single    Spouse name: Not on file  . Number of children: 3  . Years of education: Not on file  . Highest education level: Not on file  Occupational History  . Occupation: disabled  Social Needs  . Financial resource strain: Not on file  . Food  insecurity    Worry: Not on file    Inability: Not on file  . Transportation needs    Medical: Not on file    Non-medical: Not on file  Tobacco Use  . Smoking status: Former Smoker    Packs/day: 1.00    Years: 30.00    Pack years: 30.00    Types: Cigarettes    Quit date: 08/11/2010    Years since quitting: 8.8  . Smokeless tobacco: Never Used  Substance and Sexual Activity  . Alcohol use: No  . Drug use: No  . Sexual activity: Not Currently  Lifestyle  . Physical activity    Days per week: Not on file    Minutes per session: Not on file  . Stress: Not on file  Relationships  . Social Herbalist on phone: Not on file    Gets together: Not on file    Attends religious service: Not on file    Active member of club or organization: Not on file    Attends meetings of clubs or organizations: Not on file    Relationship status: Not on file  . Intimate partner violence    Fear of current or ex partner: Not on file    Emotionally abused: Not on file    Physically abused: Not on file    Forced sexual activity: Not on file  Other Topics Concern  . Not on file  Social History Narrative  . Not on file    REVIEW OF SYSTEMS: Constitutional: No fevers, chills, or sweats, no generalized fatigue, change in appetite Eyes: No visual changes, double vision, eye pain Ear, nose and throat: No hearing loss, ear pain, nasal congestion, sore throat Cardiovascular: No chest pain, palpitations Respiratory:  No shortness of breath at rest or with exertion, wheezes GastrointestinaI: No nausea, vomiting, diarrhea, abdominal pain, fecal incontinence Genitourinary:  No dysuria, urinary retention or frequency Musculoskeletal:  No neck pain, back pain Integumentary: No rash, pruritus, skin lesions Neurological: as above Psychiatric: No depression, insomnia, anxiety Endocrine: No palpitations, fatigue, diaphoresis, mood swings, change in appetite, change in weight, increased thirst  Hematologic/Lymphatic:  No purpura, petechiae. Allergic/Immunologic: no itchy/runny eyes, nasal congestion, recent allergic reactions, rashes  PHYSICAL EXAM: Blood pressure (!) 85/45, pulse 76, temperature 98.3 F (36.8 C), height 5' (1.524 m),  weight 133 lb 12.8 oz (60.7 kg), SpO2 98 %. General: No acute distress.  Patient appears well-groomed.   Head:  Normocephalic/atraumatic Eyes:  Fundi examined but not visualized Neck: supple, no paraspinal tenderness, full range of motion Heart:  Regular rate and rhythm Lungs:  Clear to auscultation bilaterally Back: No paraspinal tenderness Neurological Exam: alert and oriented to person, place, and time. Attention span and concentration intact, recent and remote memory intact, fund of knowledge intact.  Speech fluent and not dysarthric, language intact.  CN II-XII intact. Bulk and tone normal, muscle strength 5/5 throughout.  Sensation to light touch, temperature and vibration intact.  Deep tendon reflexes 2+ throughout, toes downgoing.  Finger to nose and heel to shin testing intact.  Gait normal, Romberg negative.  IMPRESSION: 1.  Chronic migraine without aura, without status migrainosus, not intractable, complicated by medication overuse (daily triptan use) 2.  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  Blurred vision.  Precedes initiation of topiramate.  She has appointment with ophthalmology.  PLAN: 1.  For preventative management, increase topiramate to 75mg  at bedtime.  We can increase to 100mg  at bedtime in 4 weeks if needed. 2.  For abortive therapy, Ubrelvy.  Advised to stop sumatriptan due to cardiac comorbidities.   3.  Limit use of pain relievers to no more than 2 days out of week to prevent risk of rebound or medication-overuse headache. 4.  Keep headache diary 5.  Exercise, hydration, caffeine cessation, sleep hygiene, monitor for and avoid triggers 6.  Consider:  magnesium citrate 400mg  daily, riboflavin 400mg   daily, and coenzyme Q10 100mg  three times daily 7. Have notes from ophthalmology sent to me. 8. Follow up in 4 months.  Crystal Clines, [name]  CC:  Reinaldo Meeker, MD

## 2019-06-08 NOTE — Telephone Encounter (Signed)
Patient returned call and got scheduled with Dr. Harriet Masson with [facility].

## 2019-06-08 NOTE — Telephone Encounter (Signed)
Left message for patient to return call.

## 2019-06-08 NOTE — Telephone Encounter (Signed)
Ok, we can bring her to the ofiice and check th ewound

## 2019-06-09 ENCOUNTER — Encounter: Payer: Self-pay | Admitting: Cardiology

## 2019-06-09 ENCOUNTER — Other Ambulatory Visit: Payer: Self-pay

## 2019-06-09 ENCOUNTER — Ambulatory Visit (INDEPENDENT_AMBULATORY_CARE_PROVIDER_SITE_OTHER): Payer: Medicare Other | Admitting: Cardiology

## 2019-06-09 VITALS — BP 82/56 | HR 80 | Ht 60.0 in | Wt 132.8 lb

## 2019-06-09 DIAGNOSIS — I420 Dilated cardiomyopathy: Secondary | ICD-10-CM | POA: Diagnosis not present

## 2019-06-09 DIAGNOSIS — I10 Essential (primary) hypertension: Secondary | ICD-10-CM

## 2019-06-09 DIAGNOSIS — E785 Hyperlipidemia, unspecified: Secondary | ICD-10-CM

## 2019-06-09 DIAGNOSIS — Z5189 Encounter for other specified aftercare: Secondary | ICD-10-CM | POA: Diagnosis not present

## 2019-06-09 DIAGNOSIS — Z95810 Presence of automatic (implantable) cardiac defibrillator: Secondary | ICD-10-CM

## 2019-06-09 MED ORDER — FUROSEMIDE 40 MG PO TABS: ORAL_TABLET | ORAL | Status: DC

## 2019-06-09 NOTE — Progress Notes (Signed)
Cardiology Office Note:    Date:  06/09/2019   ID:  COVA KNIERIEM, DOB 20-Feb-1956, MRN [id number]  PCP:  Lillard Anes, MD  Cardiologist:  No primary care provider on file.  Electrophysiologist:  Will Meredith Leeds, MD   Referring MD: Lillard Anes,*   Follow up visit   History of Present Illness:    [name] is a 63 y.o. female with a hx of dilated cardiomyopathy with last ejection fraction 30 to 35%, patient status post ICD, hypertension, hyperlipidemia, chronic heart failure with reduced ejection fraction and PVCs presents for follow-up.  The patient status post replacement of ICD due to ERI on 05/21/2019.  She reports that she has been experiencing lightheadedness over the last few days.  She denies any chest pain.  She denies any leaking or erythema of the wound.  Past Medical History:  Diagnosis Date  . AICD (automatic cardioverter/defibrillator) present 2012   . Arthritis   . Arthritis   . Asthma   . CHF (congestive heart failure) ([place])   . Chronic back pain   . COPD (chronic obstructive pulmonary disease) ([place])   . GERD (gastroesophageal reflux disease)   . Heart failure   . HLD (hyperlipidemia)   . Hypertension   . ICD (implantable cardiac defibrillator) in place 2012  . Left ventricular systolic dysfunction   . Myocardial infarction [facility])    2011 during intestinal blockage surgery  . NICM (nonischemic cardiomyopathy) ([place])   . PTSD (post-traumatic stress disorder)   . PVCs (premature ventricular contractions)   . Thyroid disease     Past Surgical History:  Procedure Laterality Date  . APPENDECTOMY    . CARDIAC CATHETERIZATION    . CARPAL TUNNEL RELEASE    . CESAREAN SECTION    . CHOLECYSTECTOMY    . CORONARY ANGIOPLASTY    . HAND SURGERY    . ICD GENERATOR CHANGEOUT N/A 05/21/2019   Procedure: ICD GENERATOR CHANGEOUT;  Surgeon: Constance Haw, MD;  Location: [facility];  Service: Cardiovascular;  Laterality: N/A;   . ICD IMPLANT     [employer]  . intestinal blockage 2011    . LAPAROSCOPIC ASSISTED VENTRAL HERNIA REPAIR N/A 03/23/2015   Procedure: LAPAROSCOPIC VENTRAL WALL HERNIA REPAIR;  Surgeon: Michael Boston, MD;  Location: [facility];  Service: General;  Laterality: N/A;  With MESH  . LAPAROSCOPIC LYSIS OF ADHESIONS N/A 03/23/2015   Procedure: LAPAROSCOPIC LYSIS OF ADHESIONS;  Surgeon: Michael Boston, MD;  Location: [facility];  Service: General;  Laterality: N/A;  . NECK SURGERY     fused  . TONSILLECTOMY    . ULNAR NERVE TRANSPOSITION  01/23/2012   Procedure: ULNAR NERVE DECOMPRESSION/TRANSPOSITION;  Surgeon: Ophelia Charter, MD;  Location: [facility];  Service: Neurosurgery;  Laterality: Left;  LEFT ulnar nerve decompression    Current Medications: Current Meds  Medication Sig  . albuterol-ipratropium (COMBIVENT) 18-103 MCG/ACT inhaler Inhale 2 puffs into the lungs every 6 (six) hours as needed for wheezing or shortness of breath.   Marland Kitchen alendronate (FOSAMAX) 70 MG tablet Take 70 mg by mouth every Monday. Take with a full glass of water on an empty stomach.  Marland Kitchen aspirin 81 MG tablet Take 81 mg by mouth daily.    . Buprenorphine HCl (BELBUCA) 900 MCG FILM Place 900 mcg inside cheek every 12 (twelve) hours.  . carvedilol (COREG) 3.125 MG tablet Take 1 tablet (3.125 mg total) by mouth 2 (two) times daily with a meal.  .  celecoxib (CELEBREX) 200 MG capsule Take 200 mg by mouth daily.   Marland Kitchen ENTRESTO 24-26 MG TAKE 1 TABLET BY MOUTH TWICE DAILY  . esomeprazole (NEXIUM) 20 MG capsule Take 20 mg by mouth daily.   . fluticasone (FLONASE) 50 MCG/ACT nasal spray Place 2 sprays into both nostrils daily as needed (congestion).   . fluticasone furoate-vilanterol (BREO ELLIPTA) 100-25 MCG/INH AEPB Inhale 1 puff into the lungs daily as needed (shortness).   . furosemide (LASIX) 40 MG tablet Take 1 tablet daily. If you gain 3 pounds in 1 day or 5 pounds in 2 days, take 0.5 tablet (20 mg) in the afternoon.  . gabapentin  (NEURONTIN) 800 MG tablet Take 800 mg by mouth 3 (three) times daily.   Marland Kitchen HYDROcodone-acetaminophen (NORCO) 10-325 MG per tablet Take 1-2 tablets by mouth every 4 (four) hours as needed for moderate pain or severe pain.  Marland Kitchen ipratropium (ATROVENT) 0.02 % nebulizer solution Take 0.5 mg by nebulization every 4 (four) hours as needed for wheezing or shortness of breath.  . linaclotide (LINZESS) 145 MCG CAPS capsule Take 145 mcg by mouth daily.   . nitroGLYCERIN (NITROSTAT) 0.4 MG SL tablet Place 1 tablet (0.4 mg total) under the tongue every 5 (five) minutes as needed for chest pain.  . ranolazine (RANEXA) 500 MG 12 hr tablet Take 500 mg by mouth 2 (two) times daily.   . rosuvastatin (CRESTOR) 20 MG tablet Take 20 mg by mouth daily.  . SUMAtriptan (IMITREX) 25 MG tablet Take 25 mg by mouth every 2 (two) hours as needed for migraine. May repeat in 2 hours if headache persists or recurs.  . topiramate (TOPAMAX) 50 MG tablet TAKE 1 TABLET(50 MG) BY MOUTH AT BEDTIME  . Ubrogepant (UBRELVY) 100 MG TABS Take 1 tablet by mouth as needed (May repeat 1 tablet after 2 hours if needed.  Maximum 2 tablets in 24 hours).  . [DISCONTINUED] furosemide (LASIX) 40 MG tablet Take 40 mg by mouth as directed. Takes 40mg  in the am and 20mg  in the pm     Allergies:   Patient has no known allergies.   Social History   Socioeconomic History  . Marital status: Single    Spouse name: Not on file  . Number of children: 3  . Years of education: Not on file  . Highest education level: Not on file  Occupational History  . Occupation: disabled  Social Needs  . Financial resource strain: Not on file  . Food insecurity    Worry: Not on file    Inability: Not on file  . Transportation needs    Medical: Not on file    Non-medical: Not on file  Tobacco Use  . Smoking status: Former Smoker    Packs/day: 1.00    Years: 30.00    Pack years: 30.00    Types: Cigarettes    Quit date: 08/11/2010    Years since quitting: 8.8   . Smokeless tobacco: Never Used  Substance and Sexual Activity  . Alcohol use: No  . Drug use: No  . Sexual activity: Not Currently  Lifestyle  . Physical activity    Days per week: Not on file    Minutes per session: Not on file  . Stress: Not on file  Relationships  . Social Herbalist on phone: Not on file    Gets together: Not on file    Attends religious service: Not on file    Active member of club  or organization: Not on file    Attends meetings of clubs or organizations: Not on file    Relationship status: Not on file  Other Topics Concern  . Not on file  Social History Narrative  . Not on file     Family History: The patient's family history includes Alcohol abuse in her father; Cancer in an other family member; Heart attack in her father, mother, and another family member; Heart failure in an other family member; Hypertension in her father and mother. There is no history of Anesthesia problems, Hypotension, Malignant hyperthermia, or Pseudochol deficiency.  ROS:   Review of Systems  Constitution: Reports lightheadedness.  Negative for decreased appetite, fever and weight gain.  HENT: Negative for congestion, ear discharge, hoarse voice and sore throat.   Eyes: Negative for discharge, redness, vision loss in right eye and visual halos.  Cardiovascular: Negative for chest pain, dyspnea on exertion, leg swelling, orthopnea and palpitations.  Respiratory: Negative for cough, hemoptysis, shortness of breath and snoring.   Endocrine: Negative for heat intolerance and polyphagia.  Hematologic/Lymphatic: Negative for bleeding problem. Does not bruise/bleed easily.  Skin: Negative for flushing, nail changes, rash and suspicious lesions.  Musculoskeletal: Negative for arthritis, joint pain, muscle cramps, myalgias, neck pain and stiffness.  Gastrointestinal: Negative for abdominal pain, bowel incontinence, diarrhea and excessive appetite.  Genitourinary: Negative for  decreased libido, genital sores and incomplete emptying.  Neurological: Negative for brief paralysis, focal weakness, headaches and loss of balance.  Psychiatric/Behavioral: Negative for altered mental status, depression and suicidal ideas.  Allergic/Immunologic: Negative for HIV exposure and persistent infections.    EKGs/Labs/Other Studies Reviewed:    The following studies were reviewed today:   EKG: None performed today.   Recent Labs: 06/23/2018: NT-Pro BNP 1,087 04/29/2019: BUN 14; Creatinine, Ser 0.80; Hemoglobin 10.8; Platelets 307; Potassium 3.9; Sodium 139  Recent Lipid Panel    Component Value Date/Time   CHOL 170 06/30/2017 0000   TRIG 160 (H) 06/30/2017 0000   HDL 64 06/30/2017 0000   CHOLHDL 2.7 06/30/2017 0000   LDLCALC 74 06/30/2017 0000    Physical Exam:    VS:  BP (!) 82/56 (BP Location: Right Arm, Patient Position: Sitting, Cuff Size: Normal)   Pulse 80   Ht 5' (1.524 m)   Wt 132 lb 12.8 oz (60.2 kg)   SpO2 98%   BMI 25.94 kg/m     Wt Readings from Last 3 Encounters:  06/09/19 132 lb 12.8 oz (60.2 kg)  05/21/19 125 lb (56.7 kg)  04/26/19 131 lb 6.4 oz (59.6 kg)     GEN: Well nourished, well developed in no acute distress HEENT: Normal NECK: No JVD; No carotid bruits LYMPHATICS: No lymphadenopathy CHEST WALL: ICD pocket in place, no erythema and no pus. CARDIAC: S1S2 noted,RRR, no murmurs, rubs, gallops RESPIRATORY:  Clear to auscultation without rales, wheezing or rhonchi  ABDOMEN: Soft, non-tender, non-distended, +bowel sounds, no guarding. EXTREMITIES: No edema, No cyanosis, no clubbing MUSCULOSKELETAL:  No edema; No deformity  SKIN: Warm and dry NEUROLOGIC:  Alert and oriented x 3, non-focal PSYCHIATRIC:  Normal affect, good insight  ASSESSMENT:    1. Dilated cardiomyopathy ([employer])   2. Essential hypertension   3. 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   4. Dyslipidemia   5. Visit for wound check    PLAN:    In  order of problems listed above:  She is hypotensive today with her systolic blood pressure in the 80s. I will decrease her lasix  to 40mg  daily. I have asked patient weight daily and if she gain greater the 3lbs in one day or 5lbs in 3 days to take her evening dose of Lasix 20mg .  I did do a wound check with the patient because she had not follow with EP. No signs of infection. The site appears to be healing. The tape was taken off with site cleaned and clear Tegaderm applied.   No other medication changes.   The patient is in agreement with the above plan. The patient left the office in stable condition.  The patient will follow up in 1 month wit Dr. Agustin Cree.  Medication Adjustments/Labs and Tests Ordered: Current medicines are reviewed at length with the patient today.  Concerns regarding medicines are outlined above.  No orders of the defined types were placed in this encounter.  Meds ordered this encounter  Medications  . furosemide (LASIX) 40 MG tablet    Sig: Take 1 tablet daily. If you gain 3 pounds in 1 day or 5 pounds in 2 days, take 0.5 tablet (20 mg) in the afternoon.    Dispense:  30 tablet    Patient Instructions  Medication Instructions:  Your physician has recommended you make the following change in your medication:   CHANGE furosemide (lasix) 40 mg: Take 1 tablet daily. If you gain 3 pounds in 1 day or 5 pounds in 2 days, take 0.5 tablet (20 mg) in the afternoon.  If you need a refill on your cardiac medications before your next appointment, please call your pharmacy.   Lab work: None  If you have labs (blood work) drawn today and your tests are completely normal, you will receive your results only by: Marland Kitchen MyChart Message (if you have MyChart) OR . A paper copy in the mail If you have any lab test that is abnormal or we need to change your treatment, we will call you to review the results.  Testing/Procedures: None  Follow-Up: At [facility], you and your  health needs are our priority.  As part of our continuing mission to provide you with exceptional heart care, we have created designated Provider Care Teams.  These Care Teams include your primary Cardiologist (physician) and Advanced Practice Providers (APPs -  Physician Assistants and Nurse Practitioners) who all work together to provide you with the care you need, when you need it. You will need a follow up appointment in 4 weeks.        Adopting a Healthy Lifestyle.  Know what a healthy weight is for you (roughly BMI <25) and aim to maintain this   Aim for 7+ servings of fruits and vegetables daily   65-80+ fluid ounces of water or unsweet tea for healthy kidneys   Limit to max 1 drink of alcohol per day; avoid smoking/tobacco   Limit animal fats in diet for cholesterol and heart health - choose grass fed whenever available   Avoid highly processed foods, and foods high in saturated/trans fats   Aim for low stress - take time to unwind and care for your mental health   Aim for 150 min of moderate intensity exercise weekly for heart health, and weights twice weekly for bone health   Aim for 7-9 hours of sleep daily   When it comes to diets, agreement about the perfect plan isnt easy to find, even among the experts. Experts at [employer] [place] developed an idea known as the Healthy Eating Plate. Just imagine a plate  divided into logical, healthy portions.   The emphasis is on diet quality:   Load up on vegetables and fruits - one-half of your plate: Aim for color and variety, and remember that potatoes dont count.   Go for whole grains - one-quarter of your plate: Whole wheat, barley, wheat berries, quinoa, oats, brown rice, and foods made with them. If you want pasta, go with whole wheat pasta.   Protein power - one-quarter of your plate: Fish, chicken, beans, and nuts are all healthy, versatile protein sources. Limit red meat.   The diet, however, does go  beyond the plate, offering a few other suggestions.   Use healthy plant oils, such as olive, canola, soy, corn, sunflower and peanut. Check the labels, and avoid partially hydrogenated oil, which have unhealthy trans fats.   If youre thirsty, drink water. Coffee and tea are good in moderation, but skip sugary drinks and limit milk and dairy products to one or two daily servings.   The type of carbohydrate in the diet is more important than the amount. Some sources of carbohydrates, such as vegetables, fruits, whole grains, and beans-are healthier than others.   Finally, stay active  Signed, Berniece Salines, DO  06/09/2019 11:08 PM    [place]

## 2019-06-09 NOTE — Patient Instructions (Signed)
Medication Instructions:  Your physician has recommended you make the following change in your medication:   CHANGE furosemide (lasix) 40 mg: Take 1 tablet daily. If you gain 3 pounds in 1 day or 5 pounds in 2 days, take 0.5 tablet (20 mg) in the afternoon.  If you need a refill on your cardiac medications before your next appointment, please call your pharmacy.   Lab work: None  If you have labs (blood work) drawn today and your tests are completely normal, you will receive your results only by: Marland Kitchen MyChart Message (if you have MyChart) OR . A paper copy in the mail If you have any lab test that is abnormal or we need to change your treatment, we will call you to review the results.  Testing/Procedures: None  Follow-Up: At [facility], you and your health needs are our priority.  As part of our continuing mission to provide you with exceptional heart care, we have created designated Provider Care Teams.  These Care Teams include your primary Cardiologist (physician) and Advanced Practice Providers (APPs -  Physician Assistants and Nurse Practitioners) who all work together to provide you with the care you need, when you need it. You will need a follow up appointment in 4 weeks.

## 2019-06-11 ENCOUNTER — Encounter: Payer: Self-pay | Admitting: Neurology

## 2019-06-11 ENCOUNTER — Ambulatory Visit (INDEPENDENT_AMBULATORY_CARE_PROVIDER_SITE_OTHER): Payer: Medicare Other | Admitting: Neurology

## 2019-06-11 ENCOUNTER — Other Ambulatory Visit: Payer: Self-pay

## 2019-06-11 VITALS — BP 85/45 | HR 76 | Temp 98.3°F | Ht 60.0 in | Wt 133.8 lb

## 2019-06-11 DIAGNOSIS — G253 Myoclonus: Secondary | ICD-10-CM

## 2019-06-11 DIAGNOSIS — I420 Dilated cardiomyopathy: Secondary | ICD-10-CM

## 2019-06-11 DIAGNOSIS — I10 Essential (primary) hypertension: Secondary | ICD-10-CM | POA: Diagnosis not present

## 2019-06-11 DIAGNOSIS — Z95810 Presence of automatic (implantable) cardiac defibrillator: Secondary | ICD-10-CM | POA: Diagnosis not present

## 2019-06-11 DIAGNOSIS — M545 Low back pain: Secondary | ICD-10-CM | POA: Diagnosis not present

## 2019-06-11 DIAGNOSIS — M419 Scoliosis, unspecified: Secondary | ICD-10-CM | POA: Diagnosis not present

## 2019-06-11 DIAGNOSIS — M5136 Other intervertebral disc degeneration, lumbar region: Secondary | ICD-10-CM | POA: Diagnosis not present

## 2019-06-11 DIAGNOSIS — Z79899 Other long term (current) drug therapy: Secondary | ICD-10-CM | POA: Diagnosis not present

## 2019-06-11 DIAGNOSIS — G43709 Chronic migraine without aura, not intractable, without status migrainosus: Secondary | ICD-10-CM | POA: Diagnosis not present

## 2019-06-11 DIAGNOSIS — M47816 Spondylosis without myelopathy or radiculopathy, lumbar region: Secondary | ICD-10-CM | POA: Diagnosis not present

## 2019-06-11 DIAGNOSIS — G8929 Other chronic pain: Secondary | ICD-10-CM | POA: Diagnosis not present

## 2019-06-11 MED ORDER — TOPIRAMATE 25 MG PO TABS: 75.0000 mg | ORAL_TABLET | Freq: Every day | ORAL | 3 refills | Status: DC

## 2019-06-11 NOTE — Patient Instructions (Signed)
1.  Increase topiramate.  I prescribed 25mg  tablets.  Take 3 tablets (75mg ) at bedtime.  If headaches not improved in 4 weeks, contact me and we can increase dose. 2.  STOP sumatriptan.  Only take Ubrelvy for when you get a headache.  May repeat tablet after 2 hours if needed. 3.  Limit use of pain relievers to no more than 2 days out of week to prevent risk of rebound or medication-overuse headache. 4.  Get an eye exam with an ophthalmologist.  Have notes sent to me 5.  The hand jerking may be due to gabapentin 6.  Follow up with me in 4 months (virtual visit)

## 2019-06-12 DIAGNOSIS — J449 Chronic obstructive pulmonary disease, unspecified: Secondary | ICD-10-CM | POA: Diagnosis not present

## 2019-06-17 DIAGNOSIS — J9611 Chronic respiratory failure with hypoxia: Secondary | ICD-10-CM | POA: Diagnosis not present

## 2019-06-17 DIAGNOSIS — I2510 Atherosclerotic heart disease of native coronary artery without angina pectoris: Secondary | ICD-10-CM | POA: Diagnosis not present

## 2019-06-17 DIAGNOSIS — I5022 Chronic systolic (congestive) heart failure: Secondary | ICD-10-CM | POA: Diagnosis not present

## 2019-06-21 ENCOUNTER — Ambulatory Visit (INDEPENDENT_AMBULATORY_CARE_PROVIDER_SITE_OTHER): Payer: Medicare Other | Admitting: *Deleted

## 2019-06-21 ENCOUNTER — Other Ambulatory Visit: Payer: Self-pay

## 2019-06-21 DIAGNOSIS — Z95810 Presence of automatic (implantable) cardiac defibrillator: Secondary | ICD-10-CM | POA: Diagnosis not present

## 2019-06-21 DIAGNOSIS — I255 Ischemic cardiomyopathy: Secondary | ICD-10-CM | POA: Diagnosis not present

## 2019-06-21 LAB — CUP PACEART INCLINIC DEVICE CHECK
Battery Remaining Longevity: 129 mo
Battery Voltage: 3.12 V
Brady Statistic AP VP Percent: 0.02 %
Brady Statistic AP VS Percent: 23.2 %
Brady Statistic AS VP Percent: 0.03 %
Brady Statistic AS VS Percent: 76.75 %
Brady Statistic RA Percent Paced: 22.59 %
Brady Statistic RV Percent Paced: 0.06 %
Date Time Interrogation Session: 20201026152150
HighPow Impedance: 57 Ohm
Implantable Lead Implant Date: 20120502
Implantable Lead Implant Date: 20120502
Implantable Lead Location: 753859
Implantable Lead Location: 753860
Implantable Lead Model: 4076
Implantable Lead Model: 7122
Implantable Pulse Generator Implant Date: 20200925
Lead Channel Impedance Value: 399 Ohm
Lead Channel Impedance Value: 551 Ohm
Lead Channel Impedance Value: 665 Ohm
Lead Channel Pacing Threshold Amplitude: 0.5 V
Lead Channel Pacing Threshold Amplitude: 1 V
Lead Channel Pacing Threshold Pulse Width: 0.4 ms
Lead Channel Pacing Threshold Pulse Width: 0.4 ms
Lead Channel Sensing Intrinsic Amplitude: 11.875 mV
Lead Channel Sensing Intrinsic Amplitude: 2.375 mV
Lead Channel Setting Pacing Amplitude: 1.5 V
Lead Channel Setting Pacing Amplitude: 2 V
Lead Channel Setting Pacing Pulse Width: 0.4 ms
Lead Channel Setting Sensing Sensitivity: 0.3 mV

## 2019-06-21 NOTE — Progress Notes (Signed)
Wound check appointment, add-on while in [place] office. Tegaderm removed, no other dressing in place. Wound without redness or edema. Incision edges fully approximated and well healed. Normal device function. Thresholds, sensing, and impedances consistent with implant measurements. Device programmed at chronic outputs s/p generator replacement. Histogram distribution appropriate for patient and level of activity. No mode switches or ventricular arrhythmias noted. Patient educated about wound care, arm mobility, shock plan, and Carelink monitor. ROV with Dr. Curt Bears in [place] in 07/2019.

## 2019-07-02 DIAGNOSIS — M47816 Spondylosis without myelopathy or radiculopathy, lumbar region: Secondary | ICD-10-CM | POA: Diagnosis not present

## 2019-07-12 ENCOUNTER — Telehealth: Payer: Self-pay | Admitting: Cardiology

## 2019-07-12 NOTE — Telephone Encounter (Signed)
Should patient still be taking Ranexa? Please advise

## 2019-07-13 DIAGNOSIS — G8929 Other chronic pain: Secondary | ICD-10-CM | POA: Diagnosis not present

## 2019-07-13 DIAGNOSIS — Z79899 Other long term (current) drug therapy: Secondary | ICD-10-CM | POA: Diagnosis not present

## 2019-07-13 DIAGNOSIS — J449 Chronic obstructive pulmonary disease, unspecified: Secondary | ICD-10-CM | POA: Diagnosis not present

## 2019-07-13 DIAGNOSIS — M5136 Other intervertebral disc degeneration, lumbar region: Secondary | ICD-10-CM | POA: Diagnosis not present

## 2019-07-13 DIAGNOSIS — M545 Low back pain: Secondary | ICD-10-CM | POA: Diagnosis not present

## 2019-07-13 DIAGNOSIS — M419 Scoliosis, unspecified: Secondary | ICD-10-CM | POA: Diagnosis not present

## 2019-07-14 DIAGNOSIS — M47816 Spondylosis without myelopathy or radiculopathy, lumbar region: Secondary | ICD-10-CM | POA: Diagnosis not present

## 2019-07-15 DIAGNOSIS — H25813 Combined forms of age-related cataract, bilateral: Secondary | ICD-10-CM | POA: Diagnosis not present

## 2019-07-15 DIAGNOSIS — H524 Presbyopia: Secondary | ICD-10-CM | POA: Diagnosis not present

## 2019-07-15 DIAGNOSIS — H04123 Dry eye syndrome of bilateral lacrimal glands: Secondary | ICD-10-CM | POA: Diagnosis not present

## 2019-07-20 ENCOUNTER — Other Ambulatory Visit: Payer: Self-pay

## 2019-07-20 ENCOUNTER — Encounter: Payer: Self-pay | Admitting: Cardiology

## 2019-07-20 ENCOUNTER — Ambulatory Visit (INDEPENDENT_AMBULATORY_CARE_PROVIDER_SITE_OTHER): Payer: Medicare Other | Admitting: Cardiology

## 2019-07-20 VITALS — BP 120/64 | HR 60 | Ht 60.0 in | Wt 138.0 lb

## 2019-07-20 DIAGNOSIS — I2510 Atherosclerotic heart disease of native coronary artery without angina pectoris: Secondary | ICD-10-CM

## 2019-07-20 DIAGNOSIS — I5022 Chronic systolic (congestive) heart failure: Secondary | ICD-10-CM

## 2019-07-20 DIAGNOSIS — I420 Dilated cardiomyopathy: Secondary | ICD-10-CM | POA: Diagnosis not present

## 2019-07-20 DIAGNOSIS — R0600 Dyspnea, unspecified: Secondary | ICD-10-CM

## 2019-07-20 DIAGNOSIS — I10 Essential (primary) hypertension: Secondary | ICD-10-CM | POA: Diagnosis not present

## 2019-07-20 DIAGNOSIS — R0609 Other forms of dyspnea: Secondary | ICD-10-CM

## 2019-07-20 MED ORDER — RANOLAZINE ER 500 MG PO TB12: 500.0000 mg | ORAL_TABLET | Freq: Two times a day (BID) | ORAL | 2 refills | Status: DC

## 2019-07-20 NOTE — Telephone Encounter (Signed)
Patient seen in office today and aware she needs to continue taking Ranexa.

## 2019-07-20 NOTE — Progress Notes (Signed)
Cardiology Office Note:    Date:  07/20/2019   ID:  [name], DOB 1955/10/01, MRN [id number]  PCP:  Lillard Anes, MD  Cardiologist:  Jenne Campus, MD    Referring MD: Lillard Anes,*   Chief Complaint  Patient presents with  . Follow-up  I am still having some dizziness  History of Present Illness:    [name] is a 63 y.o. female with history of cardiomyopathy, ICD, COPD, CHF.  Comes today to my office for follow-up recently she did see my partner Dr. Harriet Masson who reduce the dose of diuretics.  That seems to be helping very little bit.  She describes situation when she could be sitting watching TV and she will get this momentary sensation of dizziness.  The question of course is is it potentially arrhythmia.  She does have ICD but no discharges from it.  Past Medical History:  Diagnosis Date  . AICD (automatic cardioverter/defibrillator) present 2012   . Arthritis   . Arthritis   . Asthma   . CHF (congestive heart failure) ([place])   . Chronic back pain   . COPD (chronic obstructive pulmonary disease) ([place])   . GERD (gastroesophageal reflux disease)   . Heart failure   . HLD (hyperlipidemia)   . Hypertension   . ICD (implantable cardiac defibrillator) in place 2012  . Left ventricular systolic dysfunction   . Myocardial infarction [facility])    2011 during intestinal blockage surgery  . NICM (nonischemic cardiomyopathy) ([place])   . PTSD (post-traumatic stress disorder)   . PVCs (premature ventricular contractions)   . Thyroid disease     Past Surgical History:  Procedure Laterality Date  . APPENDECTOMY    . CARDIAC CATHETERIZATION    . CARPAL TUNNEL RELEASE    . CESAREAN SECTION    . CHOLECYSTECTOMY    . CORONARY ANGIOPLASTY    . HAND SURGERY    . ICD GENERATOR CHANGEOUT N/A 05/21/2019   Procedure: ICD GENERATOR CHANGEOUT;  Surgeon: Constance Haw, MD;  Location: [address] CV LAB;  Service: Cardiovascular;  Laterality: N/A;  . ICD  IMPLANT     Medtronic  . intestinal blockage 2011    . LAPAROSCOPIC ASSISTED VENTRAL HERNIA REPAIR N/A 03/23/2015   Procedure: LAPAROSCOPIC VENTRAL WALL HERNIA REPAIR;  Surgeon: Michael Boston, MD;  [facility];  Service: General;  Laterality: N/A;  With MESH  . LAPAROSCOPIC LYSIS OF ADHESIONS N/A 03/23/2015   Procedure: LAPAROSCOPIC LYSIS OF ADHESIONS;  Surgeon: Michael Boston, MD;  Location: [facility];  Service: General;  Laterality: N/A;  . NECK SURGERY     fused  . TONSILLECTOMY    . ULNAR NERVE TRANSPOSITION  01/23/2012   Procedure: ULNAR NERVE DECOMPRESSION/TRANSPOSITION;  Surgeon: Ophelia Charter, MD;  Location: [place] NEURO ORS;  Service: Neurosurgery;  Laterality: Left;  LEFT ulnar nerve decompression    Current Medications: Current Meds  Medication Sig  . albuterol-ipratropium (COMBIVENT) 18-103 MCG/ACT inhaler Inhale 2 puffs into the lungs every 6 (six) hours as needed for wheezing or shortness of breath.   Marland Kitchen alendronate (FOSAMAX) 70 MG tablet Take 70 mg by mouth every Monday. Take with a full glass of water on an empty stomach.  Marland Kitchen aspirin 81 MG tablet Take 81 mg by mouth daily.    . Buprenorphine HCl (BELBUCA) 900 MCG FILM Place 900 mcg inside cheek every 12 (twelve) hours.  . carvedilol (COREG) 3.125 MG tablet Take 1 tablet (3.125 mg total) by mouth 2 (  two) times daily with a meal.  . celecoxib (CELEBREX) 200 MG capsule Take 200 mg by mouth daily.   Marland Kitchen ENTRESTO 24-26 MG TAKE 1 TABLET BY MOUTH TWICE DAILY  . esomeprazole (NEXIUM) 20 MG capsule Take 20 mg by mouth daily.   . fluticasone (FLONASE) 50 MCG/ACT nasal spray Place 2 sprays into both nostrils daily as needed (congestion).   . fluticasone furoate-vilanterol (BREO ELLIPTA) 100-25 MCG/INH AEPB Inhale 1 puff into the lungs daily as needed (shortness).   . furosemide (LASIX) 40 MG tablet Take 1 tablet daily. If you gain 3 pounds in 1 day or 5 pounds in 2 days, take 0.5 tablet (20 mg) in the afternoon.  . gabapentin (NEURONTIN) 800  MG tablet Take 800 mg by mouth 3 (three) times daily. Patient usually takes twice a day  . ipratropium (ATROVENT) 0.02 % nebulizer solution Take 0.5 mg by nebulization every 4 (four) hours as needed for wheezing or shortness of breath.  . linaclotide (LINZESS) 145 MCG CAPS capsule Take 145 mcg by mouth daily.   . nitroGLYCERIN (NITROSTAT) 0.4 MG SL tablet Place 1 tablet (0.4 mg total) under the tongue every 5 (five) minutes as needed for chest pain.  . ranolazine (RANEXA) 500 MG 12 hr tablet Take 1 tablet (500 mg total) by mouth 2 (two) times daily.  . rosuvastatin (CRESTOR) 20 MG tablet Take 20 mg by mouth daily.  Marland Kitchen topiramate (TOPAMAX) 25 MG tablet Take 3 tablets (75 mg total) by mouth at bedtime.  Marland Kitchen Ubrogepant (UBRELVY) 100 MG TABS Take 1 tablet by mouth as needed (May repeat 1 tablet after 2 hours if needed.  Maximum 2 tablets in 24 hours).  . [DISCONTINUED] ranolazine (RANEXA) 500 MG 12 hr tablet Take 500 mg by mouth 2 (two) times daily.      Allergies:   Patient has no known allergies.   Social History   Socioeconomic History  . Marital status: Single    Spouse name: Not on file  . Number of children: 3  . Years of education: Not on file  . Highest education level: Not on file  Occupational History  . Occupation: disabled  Social Needs  . Financial resource strain: Not on file  . Food insecurity    Worry: Not on file    Inability: Not on file  . Transportation needs    Medical: Not on file    Non-medical: Not on file  Tobacco Use  . Smoking status: Former Smoker    Packs/day: 1.00    Years: 30.00    Pack years: 30.00    Types: Cigarettes    Quit date: 08/11/2010    Years since quitting: 8.9  . Smokeless tobacco: Never Used  Substance and Sexual Activity  . Alcohol use: No  . Drug use: No  . Sexual activity: Not Currently  Lifestyle  . Physical activity    Days per week: Not on file    Minutes per session: Not on file  . Stress: Not on file  Relationships  .  Social Herbalist on phone: Not on file    Gets together: Not on file    Attends religious service: Not on file    Active member of club or organization: Not on file    Attends meetings of clubs or organizations: Not on file    Relationship status: Not on file  Other Topics Concern  . Not on file  Social History Narrative   One level home  with boyfriend   Caffeine - coffee 1-2 cups/day; Green tea 4-5 bottles a day   Exercise - some         Family History: The patient's family history includes Alcohol abuse in her father; Cancer in an other family member; Heart attack in her father, mother, and another family member; Heart failure in an other family member; Hypertension in her father and mother. There is no history of Anesthesia problems, Hypotension, Malignant hyperthermia, or Pseudochol deficiency. ROS:   Please see the history of present illness.    All 14 point review of systems negative except as described per history of present illness  EKGs/Labs/Other Studies Reviewed:      Recent Labs: 04/29/2019: BUN 14; Creatinine, Ser 0.80; Hemoglobin 10.8; Platelets 307; Potassium 3.9; Sodium 139  Recent Lipid Panel    Component Value Date/Time   CHOL 170 06/30/2017 0000   TRIG 160 (H) 06/30/2017 0000   HDL 64 06/30/2017 0000   CHOLHDL 2.7 06/30/2017 0000   LDLCALC 74 06/30/2017 0000    Physical Exam:    VS:  BP 120/64   Pulse 60   Ht 5' (1.524 m)   Wt 138 lb (62.6 kg)   SpO2 96%   BMI 26.95 kg/m     Wt Readings from Last 3 Encounters:  07/20/19 138 lb (62.6 kg)  06/11/19 133 lb 12.8 oz (60.7 kg)  06/09/19 132 lb 12.8 oz (60.2 kg)     GEN:  Well nourished, well developed in no acute distress HEENT: Normal NECK: No JVD; No carotid bruits LYMPHATICS: No lymphadenopathy CARDIAC: RRR, no murmurs, no rubs, no gallops RESPIRATORY:  Clear to auscultation without rales, wheezing or rhonchi  ABDOMEN: Soft, non-tender, non-distended MUSCULOSKELETAL:  No edema; No  deformity  SKIN: Warm and dry LOWER EXTREMITIES: no swelling NEUROLOGIC:  Alert and oriented x 3 PSYCHIATRIC:  Normal affect   ASSESSMENT:    1. Chronic systolic congestive heart failure, NYHA class 2 ([place])   2. Dilated cardiomyopathy ([place])   3. Essential hypertension   4. Dyspnea on exertion   5. Coronary artery disease involving native coronary artery of native heart without angina pectoris    PLAN:    In order of problems listed above:  1. Chronic systolic congestive heart failure appears to be compensated.  I will check Chem-7 today as well as proBNP and TSH 2. Direct cardiomyopathy on all appropriate tolerated medications. 3. Essential hypertension blood pressures well controlled now. 4. Dyspnea on exertion stable. 5. Coronary disease stable. 6. Dizziness: We will schedule interrogation of her device to check for any significant arrhythmia.  Will check Chem-7.   Medication Adjustments/Labs and Tests Ordered: Current medicines are reviewed at length with the patient today.  Concerns regarding medicines are outlined above.  Orders Placed This Encounter  Procedures  . Basic Metabolic Panel (BMET)  . Pro b natriuretic peptide (BNP)  . TSH   Medication changes:  Meds ordered this encounter  Medications  . ranolazine (RANEXA) 500 MG 12 hr tablet    Sig: Take 1 tablet (500 mg total) by mouth 2 (two) times daily.    Dispense:  60 tablet    Refill:  2    Signed, Park Liter, MD, [facility] 07/20/2019 12:08 PM    [place]

## 2019-07-20 NOTE — Patient Instructions (Signed)
Medication Instructions:  Your physician recommends that you continue on your current medications as directed. Please refer to the Current Medication list given to you today.  *If you need a refill on your cardiac medications before your next appointment, please call your pharmacy*  Lab Work: Your physician recommends that you return for lab work today: bmp, pro bnp, tsh   If you have labs (blood work) drawn today and your tests are completely normal, you will receive your results only by: Marland Kitchen MyChart Message (if you have MyChart) OR . A paper copy in the mail If you have any lab test that is abnormal or we need to change your treatment, we will call you to review the results.  Testing/Procedures: None   Follow-Up: At [facility], you and your health needs are our priority.  As part of our continuing mission to provide you with exceptional heart care, we have created designated Provider Care Teams.  These Care Teams include your primary Cardiologist (physician) and Advanced Practice Providers (APPs -  Physician Assistants and Nurse Practitioners) who all work together to provide you with the care you need, when you need it.  Your next appointment:   3 month(s)  The format for your next appointment:   In Person  Provider:   Jenne Campus, MD  Other Instructions  We will arrange a remote pacemaker check for you.

## 2019-07-21 ENCOUNTER — Telehealth: Payer: Self-pay | Admitting: Emergency Medicine

## 2019-07-21 DIAGNOSIS — Z1159 Encounter for screening for other viral diseases: Secondary | ICD-10-CM | POA: Diagnosis not present

## 2019-07-21 DIAGNOSIS — J9611 Chronic respiratory failure with hypoxia: Secondary | ICD-10-CM | POA: Diagnosis not present

## 2019-07-21 DIAGNOSIS — I10 Essential (primary) hypertension: Secondary | ICD-10-CM | POA: Diagnosis not present

## 2019-07-21 DIAGNOSIS — E782 Mixed hyperlipidemia: Secondary | ICD-10-CM | POA: Diagnosis not present

## 2019-07-21 DIAGNOSIS — G253 Myoclonus: Secondary | ICD-10-CM | POA: Diagnosis not present

## 2019-07-21 LAB — BASIC METABOLIC PANEL
BUN/Creatinine Ratio: 19 (ref 12–28)
BUN: 17 mg/dL (ref 8–27)
CO2: 22 mmol/L (ref 20–29)
Calcium: 9.4 mg/dL (ref 8.7–10.3)
Chloride: 107 mmol/L — ABNORMAL HIGH (ref 96–106)
Creatinine, Ser: 0.9 mg/dL (ref 0.57–1.00)
GFR calc Af Amer: 78 mL/min/{1.73_m2} (ref >59)
GFR calc non Af Amer: 67 mL/min/{1.73_m2} (ref >59)
Glucose: 85 mg/dL (ref 65–99)
Potassium: 4.7 mmol/L (ref 3.5–5.2)
Sodium: 143 mmol/L (ref 134–144)

## 2019-07-21 LAB — PRO B NATRIURETIC PEPTIDE: NT-Pro BNP: 1644 pg/mL — ABNORMAL HIGH (ref 0–301)

## 2019-07-21 LAB — TSH: TSH: 1.53 u[IU]/mL (ref 0.450–4.500)

## 2019-07-21 NOTE — Telephone Encounter (Signed)
Patient assisted with sending remote transmission for evaluation at request of Dr Agustin Cree.

## 2019-07-26 ENCOUNTER — Telehealth: Payer: Self-pay | Admitting: Emergency Medicine

## 2019-07-26 NOTE — Telephone Encounter (Signed)
Called patient. She reports swelling in lower extremities is worse and shortness of breath on exertion is the same. Per Dr. Agustin Cree patient advised to increase lasix to 60 mg daily. We will call her on Thursday she verbally understands.

## 2019-07-26 NOTE — Telephone Encounter (Signed)
Transmission from 07/21/19 reviewed. Normal device function. Battery and lead trends stable. No AT/AF episodes. 1 NSVT episode on 06/24/19 at 12:27, 7 beats duration. AP 39.4%, VP <0.1%. HR histograms appropriate. OptiVol fluid diagnostic at baseline.   Routed to Dr. Agustin Cree as requested.

## 2019-07-28 DIAGNOSIS — M47816 Spondylosis without myelopathy or radiculopathy, lumbar region: Secondary | ICD-10-CM | POA: Diagnosis not present

## 2019-07-29 NOTE — Telephone Encounter (Signed)
Attempted to call patient to check on her since med change. No answer. Will continue efforts.

## 2019-07-30 NOTE — Telephone Encounter (Signed)
Left message for patient to return call.

## 2019-08-09 DIAGNOSIS — J449 Chronic obstructive pulmonary disease, unspecified: Secondary | ICD-10-CM | POA: Diagnosis not present

## 2019-08-10 DIAGNOSIS — M545 Low back pain: Secondary | ICD-10-CM | POA: Diagnosis not present

## 2019-08-10 DIAGNOSIS — Z79899 Other long term (current) drug therapy: Secondary | ICD-10-CM | POA: Diagnosis not present

## 2019-08-10 DIAGNOSIS — M5136 Other intervertebral disc degeneration, lumbar region: Secondary | ICD-10-CM | POA: Diagnosis not present

## 2019-08-10 DIAGNOSIS — G8929 Other chronic pain: Secondary | ICD-10-CM | POA: Diagnosis not present

## 2019-08-10 DIAGNOSIS — M419 Scoliosis, unspecified: Secondary | ICD-10-CM | POA: Diagnosis not present

## 2019-08-12 DIAGNOSIS — J449 Chronic obstructive pulmonary disease, unspecified: Secondary | ICD-10-CM | POA: Diagnosis not present

## 2019-08-13 ENCOUNTER — Encounter: Payer: Self-pay | Admitting: *Deleted

## 2019-08-13 NOTE — Progress Notes (Signed)
Shirlean Mylar Lajean Silvius (Key:  Bing) Roselyn Meier 100MG  tablets   Form [employer] Medicare Part D Electronic Prior Authorization Form (2017 NCPDP) Created 4 days ago Sent to Plan 11 minutes ago Plan Response 11 minutes ago Submit Clinical Questions 2 minutes ago Determination Wait for Determination Please wait for [employer] 2017 NCPDP to return a determination.

## 2019-08-16 NOTE — Progress Notes (Addendum)
They needed clarification on prescription. I faxed it back 08/16/2019  Benedetto Coons (Key: [place] Bing) Roselyn Meier 100MG  tablets   Form [employer] Medicare Part D Electronic Prior Authorization Form (2017 NCPDP) Created 7 days ago Sent to Plan 3 days ago Plan Response 3 days ago Submit Clinical Questions 3 days ago Determination N/A 2 days ago Message from [place] request is pending review. You may contact the Prior Authorization Department at [phone] for further questions.

## 2019-08-24 DIAGNOSIS — I2510 Atherosclerotic heart disease of native coronary artery without angina pectoris: Secondary | ICD-10-CM | POA: Diagnosis not present

## 2019-08-24 DIAGNOSIS — J9611 Chronic respiratory failure with hypoxia: Secondary | ICD-10-CM | POA: Diagnosis not present

## 2019-08-24 DIAGNOSIS — I5022 Chronic systolic (congestive) heart failure: Secondary | ICD-10-CM | POA: Diagnosis not present

## 2019-08-25 DIAGNOSIS — Z1159 Encounter for screening for other viral diseases: Secondary | ICD-10-CM | POA: Diagnosis not present

## 2019-08-25 DIAGNOSIS — N3001 Acute cystitis with hematuria: Secondary | ICD-10-CM | POA: Diagnosis not present

## 2019-09-02 ENCOUNTER — Ambulatory Visit (INDEPENDENT_AMBULATORY_CARE_PROVIDER_SITE_OTHER): Payer: Medicare Other | Admitting: *Deleted

## 2019-09-02 DIAGNOSIS — I420 Dilated cardiomyopathy: Secondary | ICD-10-CM

## 2019-09-02 LAB — CUP PACEART REMOTE DEVICE CHECK
Battery Remaining Longevity: 127 mo
Battery Voltage: 3.07 V
Brady Statistic AP VP Percent: 0.02 %
Brady Statistic AP VS Percent: 21.21 %
Brady Statistic AS VP Percent: 0.03 %
Brady Statistic AS VS Percent: 78.74 %
Brady Statistic RA Percent Paced: 20.97 %
Brady Statistic RV Percent Paced: 0.06 %
Date Time Interrogation Session: 20210107001806
HighPow Impedance: 70 Ohm
Implantable Lead Implant Date: 20120502
Implantable Lead Implant Date: 20120502
Implantable Lead Location: 753859
Implantable Lead Location: 753860
Implantable Lead Model: 4076
Implantable Lead Model: 7122
Implantable Pulse Generator Implant Date: 20200925
Lead Channel Impedance Value: 418 Ohm
Lead Channel Impedance Value: 551 Ohm
Lead Channel Impedance Value: 646 Ohm
Lead Channel Pacing Threshold Amplitude: 0.5 V
Lead Channel Pacing Threshold Amplitude: 0.875 V
Lead Channel Pacing Threshold Pulse Width: 0.4 ms
Lead Channel Pacing Threshold Pulse Width: 0.4 ms
Lead Channel Sensing Intrinsic Amplitude: 2.75 mV
Lead Channel Sensing Intrinsic Amplitude: 2.75 mV
Lead Channel Sensing Intrinsic Amplitude: 9.25 mV
Lead Channel Sensing Intrinsic Amplitude: 9.25 mV
Lead Channel Setting Pacing Amplitude: 1.5 V
Lead Channel Setting Pacing Amplitude: 2 V
Lead Channel Setting Pacing Pulse Width: 0.4 ms
Lead Channel Setting Sensing Sensitivity: 0.3 mV

## 2019-09-06 ENCOUNTER — Other Ambulatory Visit: Payer: Self-pay

## 2019-09-06 ENCOUNTER — Telehealth: Payer: Self-pay | Admitting: Neurology

## 2019-09-06 NOTE — Telephone Encounter (Signed)
The number above isnt in service, the medication will have to be sent to pharmacy and then her insurance will decide if they will pay or not. Unable to contact patient, number out of service

## 2019-09-06 NOTE — Telephone Encounter (Signed)
Patient left msg with after hours about Ubrelvi medication and would like to have the DR submit it to her [facility] insurance as they will cover the medication. Thanks!

## 2019-09-07 ENCOUNTER — Other Ambulatory Visit: Payer: Self-pay

## 2019-09-07 DIAGNOSIS — M47816 Spondylosis without myelopathy or radiculopathy, lumbar region: Secondary | ICD-10-CM | POA: Diagnosis not present

## 2019-09-07 DIAGNOSIS — M7918 Myalgia, other site: Secondary | ICD-10-CM | POA: Diagnosis not present

## 2019-09-07 MED ORDER — UBRELVY 100 MG PO TABS: 1.0000 | ORAL_TABLET | ORAL | 3 refills | Status: DC | PRN

## 2019-09-07 NOTE — Telephone Encounter (Signed)
Patient left msg with after hours with an alternative number [phone]. Thanks!

## 2019-09-07 NOTE — Telephone Encounter (Signed)
Sent ulbrevy refill, patients needs to another insurance not [employer], [facility]

## 2019-09-07 NOTE — Telephone Encounter (Signed)
No answer at [phone]

## 2019-09-08 ENCOUNTER — Telehealth: Payer: Self-pay | Admitting: Neurology

## 2019-09-08 NOTE — Telephone Encounter (Signed)
Patient left msg with after hours about [facility] needing a form returned from the doctor to fill out some information for her insurance to cover her prescription. Thanks!

## 2019-09-09 DIAGNOSIS — J441 Chronic obstructive pulmonary disease with (acute) exacerbation: Secondary | ICD-10-CM | POA: Diagnosis not present

## 2019-09-10 DIAGNOSIS — M129 Arthropathy, unspecified: Secondary | ICD-10-CM | POA: Diagnosis not present

## 2019-09-10 DIAGNOSIS — M5136 Other intervertebral disc degeneration, lumbar region: Secondary | ICD-10-CM | POA: Diagnosis not present

## 2019-09-10 DIAGNOSIS — R300 Dysuria: Secondary | ICD-10-CM | POA: Diagnosis not present

## 2019-09-10 DIAGNOSIS — G8929 Other chronic pain: Secondary | ICD-10-CM | POA: Diagnosis not present

## 2019-09-10 DIAGNOSIS — D539 Nutritional anemia, unspecified: Secondary | ICD-10-CM | POA: Diagnosis not present

## 2019-09-10 DIAGNOSIS — E559 Vitamin D deficiency, unspecified: Secondary | ICD-10-CM | POA: Diagnosis not present

## 2019-09-10 DIAGNOSIS — Z1159 Encounter for screening for other viral diseases: Secondary | ICD-10-CM | POA: Diagnosis not present

## 2019-09-10 DIAGNOSIS — M545 Low back pain: Secondary | ICD-10-CM | POA: Diagnosis not present

## 2019-09-10 DIAGNOSIS — Z79899 Other long term (current) drug therapy: Secondary | ICD-10-CM | POA: Diagnosis not present

## 2019-09-10 DIAGNOSIS — R5383 Other fatigue: Secondary | ICD-10-CM | POA: Diagnosis not present

## 2019-09-10 DIAGNOSIS — E7800 Pure hypercholesterolemia, unspecified: Secondary | ICD-10-CM | POA: Diagnosis not present

## 2019-09-10 DIAGNOSIS — M419 Scoliosis, unspecified: Secondary | ICD-10-CM | POA: Diagnosis not present

## 2019-09-10 DIAGNOSIS — Z03818 Encounter for observation for suspected exposure to other biological agents ruled out: Secondary | ICD-10-CM | POA: Diagnosis not present

## 2019-09-12 DIAGNOSIS — J449 Chronic obstructive pulmonary disease, unspecified: Secondary | ICD-10-CM | POA: Diagnosis not present

## 2019-09-12 NOTE — Progress Notes (Signed)
Electrophysiology Office Note   Date:  09/13/2019   ID:  [name], DOB 17-Aug-1956, MRN [id number]  PCP:  Lillard Anes, MD  Cardiologist: Agustin Cree Primary Electrophysiologist:  Meggin Ola Meredith Leeds, MD    No chief complaint on file.    History of Present Illness: [name] is a 64 y.o. female who is being seen today for the evaluation of ischemic cardiomyopathy at the request of Loyal Jacobson. Presenting today for electrophysiology evaluation.  She has a history of coronary artery disease status post MI, ischemic cardiomyopathy with a Medtronic dual-chamber ICD, PVCs, hypertension, and hyperlipidemia.  Her device reached ERI and she is now status post generator change 05/21/2019.   Today, denies symptoms of palpitations, chest pain, shortness of breath, orthopnea, PND, lower extremity edema, claudication, dizziness, presyncope, syncope, bleeding, or neurologic sequela. The patient is tolerating medications without difficulties.  Overall she is doing well.  She has no chest pain or shortness of breath.  She has not had any issues since her generator change.    Past Medical History:  Diagnosis Date  . AICD (automatic cardioverter/defibrillator) present 2012   . Arthritis   . Arthritis   . Asthma   . CHF (congestive heart failure) ([place])   . Chronic back pain   . COPD (chronic obstructive pulmonary disease) ([place])   . GERD (gastroesophageal reflux disease)   . Heart failure   . HLD (hyperlipidemia)   . Hypertension   . ICD (implantable cardiac defibrillator) in place 2012  . Left ventricular systolic dysfunction   . Myocardial infarction [facility])    2011 during intestinal blockage surgery  . NICM (nonischemic cardiomyopathy) ([place])   . PTSD (post-traumatic stress disorder)   . PVCs (premature ventricular contractions)   . Thyroid disease    Past Surgical History:  Procedure Laterality Date  . APPENDECTOMY    . CARDIAC CATHETERIZATION    . CARPAL TUNNEL  RELEASE    . CESAREAN SECTION    . CHOLECYSTECTOMY    . CORONARY ANGIOPLASTY    . HAND SURGERY    . ICD GENERATOR CHANGEOUT N/A 05/21/2019   Procedure: ICD GENERATOR CHANGEOUT;  Surgeon: Constance Haw, MD;  Location: [place] CV LAB;  Service: Cardiovascular;  Laterality: N/A;  . ICD IMPLANT     Medtronic  . intestinal blockage 2011    . LAPAROSCOPIC ASSISTED VENTRAL HERNIA REPAIR N/A 03/23/2015   Procedure: LAPAROSCOPIC VENTRAL WALL HERNIA REPAIR;  Surgeon: Michael Boston, MD;  [facility];  Service: General;  Laterality: N/A;  With MESH  . LAPAROSCOPIC LYSIS OF ADHESIONS N/A 03/23/2015   Procedure: LAPAROSCOPIC LYSIS OF ADHESIONS;  Surgeon: Michael Boston, MD;  [facility];  Service: General;  Laterality: N/A;  . NECK SURGERY     fused  . TONSILLECTOMY    . ULNAR NERVE TRANSPOSITION  01/23/2012   Procedure: ULNAR NERVE DECOMPRESSION/TRANSPOSITION;  Surgeon: Ophelia Charter, MD;  Location: [place] NEURO ORS;  Service: Neurosurgery;  Laterality: Left;  LEFT ulnar nerve decompression     Current Outpatient Medications  Medication Sig Dispense Refill  . albuterol-ipratropium (COMBIVENT) 18-103 MCG/ACT inhaler Inhale 2 puffs into the lungs every 6 (six) hours as needed for wheezing or shortness of breath.     Marland Kitchen alendronate (FOSAMAX) 70 MG tablet Take 70 mg by mouth every Monday. Take with a full glass of water on an empty stomach.    Marland Kitchen aspirin 81 MG tablet Take 81 mg by mouth daily.      Marland Kitchen  Buprenorphine HCl (BELBUCA) 900 MCG FILM Place 900 mcg inside cheek every 12 (twelve) hours.    . carvedilol (COREG) 3.125 MG tablet Take 1 tablet (3.125 mg total) by mouth 2 (two) times daily with a meal. 180 tablet 3  . celecoxib (CELEBREX) 200 MG capsule Take 200 mg by mouth daily.   3  . diazepam (VALIUM) 5 MG tablet Take 5 mg by mouth 3 (three) times daily as needed.    Marland Kitchen ENTRESTO 24-26 MG TAKE 1 TABLET BY MOUTH TWICE DAILY 180 tablet 1  . esomeprazole (NEXIUM) 20 MG capsule Take 20 mg by  mouth daily.     . fluticasone (FLONASE) 50 MCG/ACT nasal spray Place 2 sprays into both nostrils daily as needed (congestion).     . fluticasone furoate-vilanterol (BREO ELLIPTA) 100-25 MCG/INH AEPB Inhale 1 puff into the lungs daily as needed (shortness).     . furosemide (LASIX) 40 MG tablet Take 1 tablet daily. If you gain 3 pounds in 1 day or 5 pounds in 2 days, take 0.5 tablet (20 mg) in the afternoon. 30 tablet   . gabapentin (NEURONTIN) 800 MG tablet Take 800 mg by mouth 3 (three) times daily. Patient usually takes twice a day  2  . ipratropium (ATROVENT) 0.02 % nebulizer solution Take 0.5 mg by nebulization every 4 (four) hours as needed for wheezing or shortness of breath.    . linaclotide (LINZESS) 145 MCG CAPS capsule Take 145 mcg by mouth daily.     . nitroGLYCERIN (NITROSTAT) 0.4 MG SL tablet Place 1 tablet (0.4 mg total) under the tongue every 5 (five) minutes as needed for chest pain. 25 tablet 6  . Oxycodone HCl 10 MG TABS Take 1 tablet by mouth every 4 (four) hours as needed.    . ranolazine (RANEXA) 500 MG 12 hr tablet Take 1 tablet (500 mg total) by mouth 2 (two) times daily. 60 tablet 2  . rosuvastatin (CRESTOR) 20 MG tablet Take 20 mg by mouth daily.    . sertraline (ZOLOFT) 50 MG tablet Take 50 mg by mouth daily.    Marland Kitchen topiramate (TOPAMAX) 25 MG tablet Take 3 tablets (75 mg total) by mouth at bedtime. 90 tablet 3  . Ubrogepant (UBRELVY) 100 MG TABS Take 1 tablet by mouth as needed (May repeat 1 tablet after 2 hours if needed.  Maximum 2 tablets in 24 hours). 16 tablet 3   No current facility-administered medications for this visit.    Allergies:   Patient has no known allergies.   Social History:  The patient  reports that she quit smoking about 9 years ago. Her smoking use included cigarettes. She has a 30.00 pack-year smoking history. She has never used smokeless tobacco. She reports that she does not drink alcohol or use drugs.   Family History:  The patient's family  history includes Alcohol abuse in her father; Cancer in an other family member; Heart attack in her father, mother, and another family member; Heart failure in an other family member; Hypertension in her father and mother.    ROS:  Please see the history of present illness.   Otherwise, review of systems is positive for none.   All other systems are reviewed and negative.   PHYSICAL EXAM: VS:  BP 104/68   Pulse 70   Ht 5' (1.524 m)   Wt 139 lb 3.2 oz (63.1 kg)   BMI 27.19 kg/m  , BMI Body mass index is 27.19 kg/m. GEN: Well nourished, well  developed, in no acute distress  HEENT: normal  Neck: no JVD, carotid bruits, or masses Cardiac: RRR; no murmurs, rubs, or gallops,no edema  Respiratory:  clear to auscultation bilaterally, normal work of breathing GI: soft, nontender, nondistended, + BS MS: no deformity or atrophy  Skin: warm and dry, device site well healed Neuro:  Strength and sensation are intact Psych: euthymic mood, full affect  EKG:  EKG is ordered today. Personal review of the ekg ordered shows atrial paced, anteroseptal Q waves  Personal review of the device interrogation today. Results in [place]: 04/29/2019: Hemoglobin 10.8; Platelets 307 07/20/2019: BUN 17; Creatinine, Ser 0.90; NT-Pro BNP 1,644; Potassium 4.7; Sodium 143; TSH 1.530    Lipid Panel     Component Value Date/Time   CHOL 170 06/30/2017 0000   TRIG 160 (H) 06/30/2017 0000   HDL 64 06/30/2017 0000   CHOLHDL 2.7 06/30/2017 0000   LDLCALC 74 06/30/2017 0000     Wt Readings from Last 3 Encounters:  09/13/19 139 lb 3.2 oz (63.1 kg)  07/20/19 138 lb (62.6 kg)  06/11/19 133 lb 12.8 oz (60.7 kg)      Other studies Reviewed: Additional studies/ records that were reviewed today include: TTE 06/2017  Review of the above records today demonstrates:  - Left ventricle: The cavity size was normal. Systolic function was   moderately to severely reduced. The estimated ejection fraction    was in the range of 30% to 35%. Hypokinesis of the inferoseptal   myocardium. Doppler parameters are consistent with abnormal left   ventricular relaxation (grade 1 diastolic dysfunction). - Aortic valve: Valve area (VTI): 1.95 cm^2. Valve area (Vmax):   1.85 cm^2. Valve area (Vmean): 1.98 cm^2. - Mitral valve: There was mild regurgitation. - Left atrium: The atrium was mildly dilated.   ASSESSMENT AND PLAN:  1.  Ischemic cardiomyopathy: Currently on optimal medical therapy.  Is now status post Medtronic dual-chamber ICD with generator change 05/21/2019.  Device functioning appropriately.  No changes at this time.  2.  Hypertension: Well-controlled  3.  Hyperlipidemia: Statin per primary care  4.  Coronary artery disease: No current chest pain  5.  PVCs: 4% PVCs on monitor.  Continue current management..  Current medicines are reviewed at length with the patient today.   The patient does not have concerns regarding her medicines.  The following changes were made today: None  Labs/ tests ordered today include:  Orders Placed This Encounter  Procedures  . EKG 12-Lead    Disposition:   FU with Chalsea Darko 12 months  Signed, Andray Assefa Meredith Leeds, MD  09/13/2019 11:35 AM     [facility] [address] [place] [address] [phone] (office) [phone] (fax)

## 2019-09-13 ENCOUNTER — Ambulatory Visit (INDEPENDENT_AMBULATORY_CARE_PROVIDER_SITE_OTHER): Payer: Medicare Other | Admitting: Cardiology

## 2019-09-13 ENCOUNTER — Other Ambulatory Visit: Payer: Self-pay

## 2019-09-13 ENCOUNTER — Encounter: Payer: Self-pay | Admitting: Cardiology

## 2019-09-13 VITALS — BP 104/68 | HR 70 | Ht 60.0 in | Wt 139.2 lb

## 2019-09-13 DIAGNOSIS — I5022 Chronic systolic (congestive) heart failure: Secondary | ICD-10-CM

## 2019-09-13 DIAGNOSIS — I420 Dilated cardiomyopathy: Secondary | ICD-10-CM | POA: Diagnosis not present

## 2019-09-13 DIAGNOSIS — Z95810 Presence of automatic (implantable) cardiac defibrillator: Secondary | ICD-10-CM | POA: Diagnosis not present

## 2019-09-13 LAB — CUP PACEART INCLINIC DEVICE CHECK
Battery Remaining Longevity: 127 mo
Battery Voltage: 3.08 V
Brady Statistic AP VP Percent: 0.03 %
Brady Statistic AP VS Percent: 28.16 %
Brady Statistic AS VP Percent: 0.03 %
Brady Statistic AS VS Percent: 71.78 %
Brady Statistic RA Percent Paced: 27.78 %
Brady Statistic RV Percent Paced: 0.07 %
Date Time Interrogation Session: 20210118113020
HighPow Impedance: 64 Ohm
Implantable Lead Implant Date: 20120502
Implantable Lead Implant Date: 20120502
Implantable Lead Location: 753859
Implantable Lead Location: 753860
Implantable Lead Model: 4076
Implantable Lead Model: 7122
Implantable Pulse Generator Implant Date: 20200925
Lead Channel Impedance Value: 399 Ohm
Lead Channel Impedance Value: 532 Ohm
Lead Channel Impedance Value: 646 Ohm
Lead Channel Pacing Threshold Amplitude: 0.5 V
Lead Channel Pacing Threshold Amplitude: 1 V
Lead Channel Pacing Threshold Pulse Width: 0.4 ms
Lead Channel Pacing Threshold Pulse Width: 0.4 ms
Lead Channel Sensing Intrinsic Amplitude: 14.875 mV
Lead Channel Sensing Intrinsic Amplitude: 2.375 mV
Lead Channel Setting Pacing Amplitude: 1.5 V
Lead Channel Setting Pacing Amplitude: 2 V
Lead Channel Setting Pacing Pulse Width: 0.4 ms
Lead Channel Setting Sensing Sensitivity: 0.3 mV

## 2019-09-13 NOTE — Patient Instructions (Addendum)
Medication Instructions:  Your physician recommends that you continue on your current medications as directed. Please refer to the Current Medication list given to you today.  *If you need a refill on your cardiac medications before your next appointment, please call your pharmacy*  Labwork: None ordered  Testing/Procedures: None ordered  Follow-Up: Remote monitoring is used to monitor your Pacemaker or ICD from home. This monitoring reduces the number of office visits required to check your device to one time per year. It allows [place] to keep an eye on the functioning of your device to ensure it is working properly. You are scheduled for a device check from home on 12/02/2019. You may send your transmission at any time that day. If you have a wireless device, the transmission will be sent automatically. After your physician reviews your transmission, you will receive a postcard with your next transmission date.  Your physician wants you to follow-up in: 1 year with Dr. Curt Bears. You will receive a reminder letter in the mail two months in advance. If you don't receive a letter, please call our office to schedule the follow-up appointment.   Thank you for choosing [facility]!!   Trinidad Curet, RN [phone]  Any Other Special Instructions Will Be Listed Below (If Applicable).

## 2019-09-20 DIAGNOSIS — I2510 Atherosclerotic heart disease of native coronary artery without angina pectoris: Secondary | ICD-10-CM | POA: Diagnosis not present

## 2019-09-20 DIAGNOSIS — I5022 Chronic systolic (congestive) heart failure: Secondary | ICD-10-CM | POA: Diagnosis not present

## 2019-09-20 DIAGNOSIS — U071 COVID-19: Secondary | ICD-10-CM | POA: Diagnosis not present

## 2019-09-20 DIAGNOSIS — J9611 Chronic respiratory failure with hypoxia: Secondary | ICD-10-CM | POA: Diagnosis not present

## 2019-09-20 DIAGNOSIS — R0981 Nasal congestion: Secondary | ICD-10-CM | POA: Diagnosis not present

## 2019-09-21 ENCOUNTER — Other Ambulatory Visit: Payer: Self-pay | Admitting: Unknown Physician Specialty

## 2019-09-21 ENCOUNTER — Telehealth: Payer: Self-pay | Admitting: Unknown Physician Specialty

## 2019-09-21 DIAGNOSIS — J449 Chronic obstructive pulmonary disease, unspecified: Secondary | ICD-10-CM

## 2019-09-21 DIAGNOSIS — I420 Dilated cardiomyopathy: Secondary | ICD-10-CM

## 2019-09-21 DIAGNOSIS — U071 COVID-19: Secondary | ICD-10-CM

## 2019-09-21 NOTE — Telephone Encounter (Signed)
  I connected by phone with Joslyn Devon on 09/21/2019 at 4:44 PM to discuss the potential use of an new treatment for mild to moderate COVID-19 viral infection in non-hospitalized patients.  This patient is a 64 y.o. female that meets the FDA criteria for Emergency Use Authorization of bamlanivimab or casirivimab\imdevimab.  Has a (+) direct SARS-CoV-2 viral test result  Has mild or moderate COVID-19   Is ? 66 years of age and weighs ? 40 kg  Is NOT hospitalized due to COVID-19  Is NOT requiring oxygen therapy or requiring an increase in baseline oxygen flow rate due to COVID-19  Is within 10 days of symptom onset  Has at least one of the high risk factor(s) for progression to severe COVID-19 and/or hospitalization as defined in EUA.  Specific high risk criteria : >/= 66 yo with heart disease managed by pcp and cardiology   I have spoken and communicated the following to the patient or parent/caregiver:  1. FDA has authorized the emergency use of bamlanivimab and casirivimab\imdevimab for the treatment of mild to moderate COVID-19 in adults and pediatric patients with positive results of direct SARS-CoV-2 viral testing who are 42 years of age and older weighing at least 40 kg, and who are at high risk for progressing to severe COVID-19 and/or hospitalization.  2. The significant known and potential risks and benefits of bamlanivimab and casirivimab\imdevimab, and the extent to which such potential risks and benefits are unknown.  3. Information on available alternative treatments and the risks and benefits of those alternatives, including clinical trials.  4. Patients treated with bamlanivimab and casirivimab\imdevimab should continue to self-isolate and use infection control measures (e.g., wear mask, isolate, social distance, avoid sharing personal items, clean and disinfect "high touch" surfaces, and frequent handwashing) according to CDC guidelines.   5. The patient or  parent/caregiver has the option to accept or refuse bamlanivimab or casirivimab\imdevimab .  After reviewing this information with the patient, The patient agreed to proceed with receiving the bamlanimivab infusion and will be provided a copy of the Fact sheet prior to receiving the infusion.Kathrine Haddock 09/21/2019 4:44 PM  Sx onset 1/22.  From [facility]

## 2019-09-22 NOTE — Telephone Encounter (Signed)
Closing encounter

## 2019-09-24 ENCOUNTER — Ambulatory Visit (HOSPITAL_COMMUNITY)
Admission: RE | Admit: 2019-09-24 | Discharge: 2019-09-24 | Disposition: A | Discharge status: Home / Self Care | Payer: Medicare Other | Source: Ambulatory Visit | Attending: Pulmonary Disease | Admitting: Pulmonary Disease

## 2019-09-24 DIAGNOSIS — U071 COVID-19: Secondary | ICD-10-CM

## 2019-09-24 DIAGNOSIS — Z23 Encounter for immunization: Secondary | ICD-10-CM | POA: Insufficient documentation

## 2019-09-24 DIAGNOSIS — J449 Chronic obstructive pulmonary disease, unspecified: Secondary | ICD-10-CM | POA: Diagnosis present

## 2019-09-24 DIAGNOSIS — I420 Dilated cardiomyopathy: Secondary | ICD-10-CM

## 2019-09-24 MED ORDER — METHYLPREDNISOLONE SODIUM SUCC 125 MG IJ SOLR: 125.0000 mg | Freq: Once | INTRAMUSCULAR | Status: DC | PRN

## 2019-09-24 MED ORDER — SODIUM CHLORIDE 0.9 % IV SOLN
INTRAVENOUS | Status: DC | PRN
  Administered 2019-09-24: 250 mL via INTRAVENOUS

## 2019-09-24 MED ORDER — ALBUTEROL SULFATE HFA 108 (90 BASE) MCG/ACT IN AERS: 2.0000 | INHALATION_SPRAY | Freq: Once | RESPIRATORY_TRACT | Status: DC | PRN

## 2019-09-24 MED ORDER — SODIUM CHLORIDE 0.9 % IV SOLN
700.0000 mg | Freq: Once | INTRAVENOUS | Status: AC
  Administered 2019-09-24: 700 mg via INTRAVENOUS
  Filled 2019-09-24: qty 20

## 2019-09-24 MED ORDER — EPINEPHRINE 0.3 MG/0.3ML IJ SOAJ: 0.3000 mg | Freq: Once | INTRAMUSCULAR | Status: DC | PRN

## 2019-09-24 MED ORDER — DIPHENHYDRAMINE HCL 50 MG/ML IJ SOLN: 50.0000 mg | Freq: Once | INTRAMUSCULAR | Status: DC | PRN

## 2019-09-24 MED ORDER — FAMOTIDINE IN NACL 20-0.9 MG/50ML-% IV SOLN: 20.0000 mg | Freq: Once | INTRAVENOUS | Status: DC | PRN

## 2019-09-24 NOTE — Discharge Instructions (Signed)

## 2019-09-24 NOTE — Progress Notes (Signed)
  Diagnosis: COVID-19  Physician: Dr. Joya Gaskins  Procedure: [facility] Med: bamlanivimab infusion - Provided patient with bamlanimivab fact sheet for patients, parents and caregivers prior to infusion.  Complications: No immediate complications noted.  Discharge: Discharged home   [name] 09/24/2019

## 2019-09-29 ENCOUNTER — Other Ambulatory Visit: Payer: Self-pay | Admitting: Neurology

## 2019-10-07 DIAGNOSIS — M7918 Myalgia, other site: Secondary | ICD-10-CM | POA: Diagnosis not present

## 2019-10-07 DIAGNOSIS — J441 Chronic obstructive pulmonary disease with (acute) exacerbation: Secondary | ICD-10-CM | POA: Diagnosis not present

## 2019-10-07 DIAGNOSIS — M5416 Radiculopathy, lumbar region: Secondary | ICD-10-CM | POA: Diagnosis not present

## 2019-10-07 DIAGNOSIS — M47816 Spondylosis without myelopathy or radiculopathy, lumbar region: Secondary | ICD-10-CM | POA: Diagnosis not present

## 2019-10-07 DIAGNOSIS — M792 Neuralgia and neuritis, unspecified: Secondary | ICD-10-CM | POA: Diagnosis not present

## 2019-10-08 DIAGNOSIS — M419 Scoliosis, unspecified: Secondary | ICD-10-CM | POA: Diagnosis not present

## 2019-10-08 DIAGNOSIS — G8929 Other chronic pain: Secondary | ICD-10-CM | POA: Diagnosis not present

## 2019-10-08 DIAGNOSIS — M545 Low back pain: Secondary | ICD-10-CM | POA: Diagnosis not present

## 2019-10-08 DIAGNOSIS — M5136 Other intervertebral disc degeneration, lumbar region: Secondary | ICD-10-CM | POA: Diagnosis not present

## 2019-10-13 ENCOUNTER — Telehealth: Payer: Self-pay | Admitting: Neurology

## 2019-10-13 DIAGNOSIS — J449 Chronic obstructive pulmonary disease, unspecified: Secondary | ICD-10-CM | POA: Diagnosis not present

## 2019-10-13 NOTE — Telephone Encounter (Signed)
Received fax from [place] Tab 100mg  medication has been approved from 02/08/19 to 08/26/19 and again for 08/27/19 to 08/25/20.

## 2019-10-15 ENCOUNTER — Encounter: Payer: Self-pay | Admitting: Neurology

## 2019-10-17 ENCOUNTER — Other Ambulatory Visit: Payer: Self-pay | Admitting: Neurology

## 2019-10-17 NOTE — Progress Notes (Signed)
Virtual Visit via Video Note The purpose of this virtual visit is to provide medical care while limiting exposure to the novel coronavirus.    Consent was obtained for video visit:  Yes.   Answered questions that patient had about telehealth interaction:  Yes.   I discussed the limitations, risks, security and privacy concerns of performing an evaluation and management service by telemedicine. I also discussed with the patient that there may be a patient responsible charge related to this service. The patient expressed understanding and agreed to proceed.  Pt location: Home Physician Location: office Name of referring provider:  Lillard Anes,* I connected with [name] at patients initiation/request on 10/19/2019 at  8:50 AM EST by video enabled telemedicine application and verified that I am speaking with the correct person using two identifiers. Pt MRN:  [id number] Pt DOB:  [birth year] Video Participants:  [name];    History of Present Illness:  [name] is a 64 year old Caucasian woman with CHF with EF 30-35%, s/p AICD, COPD, chronic back pain, chronic knee pain, arthritis, HTN,hyperlipidemiaand PTSD and history of MI and former smokerand history of childhood seizureswho follows up for migraines and dizziness.  UPDATE: Started on topiramate.  Advised to stop sumatriptan and instead take Ubrelvy.  She continues to have jerking of her arms and legs.  Topiramate was increased.  Migraine frequency is down.  Intensity:  severe Duration:  All day.  1 hour if takes [place] immediately.   Frequency:  every other day. Frequency of abortive medication:sumatriptan daily Current NSAIDS:ASA 81mg ; Celebrex Current analgesics:none Current triptans:none Current ergotamine:none Current anti-emetic:none Current muscle relaxants:none Current anti-anxiolytic:Valium Current sleep aide:none Current Antihypertensive medications:Coreg, Lasix Current  Antidepressant medications: none Current Anticonvulsant medications:Topiramate 75mg  at bedtime; gabapentin800mg  twice daily Current anti-CGRP:Ubrelvy 100mg  Current Vitamins/Herbal/Supplements:none Current Antihistamines/Decongestants:Flonase Other therapy:none Hormone/birth control:none  Caffeine:3 cups of coffee daily Alcohol:no Smoker:former Diet:100 oz water daily. No soda.  Exercise:no  HISTORY: She has had headaches since around 2017 but gradually got worse, particularly since November 2019.They are severe bifrontal pounding/squeezing headache. There is noaura.They are associated withphotophobia, phonophobia, blurred vision and dizziness.They typically last 1 hourand occur 5 to 6 a day every day. They are triggered by loud noise.Sumatriptan helps relieve the intensity but does not abort it..  Since November 2019, shealso has had episodes of dizziness outside of the dizziness as well,causing her to fall. She reports sudden spinning sensation lasting a few seconds and resolves. She says it is not positional. She has associated nausea and blurred vision. She says it occurs 2 to 3 times a day.   She had a CT of head without contrast on 11/02/18 which was personally reviewed and was unremarkable, demonstrating mild atrophy and mild chronic small vessel ischemic changes but no acute intracranial abnormality.  She is treated by pain management for chronic pain. She has degenerative disc disease of the cervical spine with previous neck fusion. She has history of total right hip replacement. She has chronic low back pain with radicular symptoms. She has bilateral knee pain.  Past NSAIDS:none Past analgesics:Tylenol (contraindicated due to CHF) Past abortive triptans:sumatriptan 25mg  Past abortive ergotamine:none Past muscle relaxants:none Past anti-emetic:none Past antihypertensive medications:lisinopril Past antidepressant  medications:none Past anticonvulsant medications:none Past anti-CGRP:none Past vitamins/Herbal/Supplements:none Past antihistamines/decongestants:none Other past therapies:none  Past Medical History: Past Medical History:  Diagnosis Date  . AICD (automatic cardioverter/defibrillator) present 2012   . Arthritis   . Arthritis   . Asthma   . CHF (congestive heart  failure) ([place])   . Chronic back pain   . COPD (chronic obstructive pulmonary disease) ([place])   . GERD (gastroesophageal reflux disease)   . Heart failure   . HLD (hyperlipidemia)   . Hypertension   . ICD (implantable cardiac defibrillator) in place 2012  . Left ventricular systolic dysfunction   . Myocardial infarction [facility])    2011 during intestinal blockage surgery  . NICM (nonischemic cardiomyopathy) ([place])   . PTSD (post-traumatic stress disorder)   . PVCs (premature ventricular contractions)   . Thyroid disease     Medications: Outpatient Encounter Medications as of 10/19/2019  Medication Sig  . albuterol-ipratropium (COMBIVENT) 18-103 MCG/ACT inhaler Inhale 2 puffs into the lungs every 6 (six) hours as needed for wheezing or shortness of breath.   Marland Kitchen alendronate (FOSAMAX) 70 MG tablet Take 70 mg by mouth every Monday. Take with a full glass of water on an empty stomach.  Marland Kitchen aspirin 81 MG tablet Take 81 mg by mouth daily.    . Buprenorphine HCl (BELBUCA) 900 MCG FILM Place 900 mcg inside cheek every 12 (twelve) hours.  . carvedilol (COREG) 3.125 MG tablet Take 1 tablet (3.125 mg total) by mouth 2 (two) times daily with a meal.  . celecoxib (CELEBREX) 200 MG capsule Take 200 mg by mouth daily.   . diazepam (VALIUM) 5 MG tablet Take 5 mg by mouth 3 (three) times daily as needed.  Marland Kitchen ENTRESTO 24-26 MG TAKE 1 TABLET BY MOUTH TWICE DAILY  . esomeprazole (NEXIUM) 20 MG capsule Take 20 mg by mouth daily.   . fluticasone (FLONASE) 50 MCG/ACT nasal spray Place 2 sprays into both nostrils daily as needed  (congestion).   . fluticasone furoate-vilanterol (BREO ELLIPTA) 100-25 MCG/INH AEPB Inhale 1 puff into the lungs daily as needed (shortness).   . furosemide (LASIX) 40 MG tablet Take 1 tablet daily. If you gain 3 pounds in 1 day or 5 pounds in 2 days, take 0.5 tablet (20 mg) in the afternoon.  . gabapentin (NEURONTIN) 800 MG tablet Take 800 mg by mouth 3 (three) times daily. Patient usually takes twice a day  . ipratropium (ATROVENT) 0.02 % nebulizer solution Take 0.5 mg by nebulization every 4 (four) hours as needed for wheezing or shortness of breath.  . linaclotide (LINZESS) 145 MCG CAPS capsule Take 145 mcg by mouth daily.   . nitroGLYCERIN (NITROSTAT) 0.4 MG SL tablet Place 1 tablet (0.4 mg total) under the tongue every 5 (five) minutes as needed for chest pain.  . Oxycodone HCl 10 MG TABS Take 1 tablet by mouth every 4 (four) hours as needed.  . ranolazine (RANEXA) 500 MG 12 hr tablet Take 1 tablet (500 mg total) by mouth 2 (two) times daily.  . rosuvastatin (CRESTOR) 20 MG tablet Take 20 mg by mouth daily.  . sertraline (ZOLOFT) 50 MG tablet Take 50 mg by mouth daily.  Marland Kitchen topiramate (TOPAMAX) 25 MG tablet Take 3 tablets (75 mg total) by mouth at bedtime.  Marland Kitchen Ubrogepant (UBRELVY) 100 MG TABS Take 1 tablet by mouth as needed (May repeat 1 tablet after 2 hours if needed.  Maximum 2 tablets in 24 hours).   No facility-administered encounter medications on file as of 10/19/2019.    Allergies: No Known Allergies  Family History: Family History  Problem Relation Age of Onset  . Heart attack Other   . Cancer Other   . Heart failure Other   . Hypertension Mother   . Heart attack Mother   .  Alcohol abuse Father   . Hypertension Father   . Heart attack Father   . Anesthesia problems Neg Hx   . Hypotension Neg Hx   . Malignant hyperthermia Neg Hx   . Pseudochol deficiency Neg Hx     Social History: Social History   Socioeconomic History  . Marital status: Single    Spouse name: Not  on file  . Number of children: 3  . Years of education: Not on file  . Highest education level: Not on file  Occupational History  . Occupation: disabled  Tobacco Use  . Smoking status: Former Smoker    Packs/day: 1.00    Years: 30.00    Pack years: 30.00    Types: Cigarettes    Quit date: 08/11/2010    Years since quitting: 9.1  . Smokeless tobacco: Never Used  Substance and Sexual Activity  . Alcohol use: No  . Drug use: No  . Sexual activity: Not Currently  Other Topics Concern  . Not on file  Social History Narrative   One level home with boyfriend   Caffeine - coffee 1-2 cups/day; Green tea 4-5 bottles a day   Exercise - some    Right handed      Social Determinants of Health   Financial Resource Strain:   . Difficulty of Paying Living Expenses: Not on file  Food Insecurity:   . Worried About Charity fundraiser in the Last Year: Not on file  . Ran Out of Food in the Last Year: Not on file  Transportation Needs:   . Lack of Transportation (Medical): Not on file  . Lack of Transportation (Non-Medical): Not on file  Physical Activity:   . Days of Exercise per Week: Not on file  . Minutes of Exercise per Session: Not on file  Stress:   . Feeling of Stress : Not on file  Social Connections:   . Frequency of Communication with Friends and Family: Not on file  . Frequency of Social Gatherings with Friends and Family: Not on file  . Attends Religious Services: Not on file  . Active Member of Clubs or Organizations: Not on file  . Attends Archivist Meetings: Not on file  . Marital Status: Not on file  Intimate Partner Violence:   . Fear of Current or Ex-Partner: Not on file  . Emotionally Abused: Not on file  . Physically Abused: Not on file  . Sexually Abused: Not on file    Observations/Objective:   Height 5\' 3"  (1.6 m), weight 130 lb (59 kg). No acute distress.  Alert and oriented.  Speech fluent and not dysarthric.  Language intact.  Eyes  orthophoric on primary gaze.  Face symmetric.  Assessment and Plan:   1.  Chronic migraine without aura, without status migrainosus, not intractable, improved with increased topiramate. 2.  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 I recommend discussing with prescribing provider about tapering off of gabapentin to see if myoclonus resolves.  2. For preventative management, increase topiramate to 100mg  at bedtime to achieve further reduced migraine frequency. 3.  For abortive therapy, Ubrelvy 100mg  4.  Limit use of pain relievers to no more than 2 days out of week to prevent risk of rebound or medication-overuse headache. 5.  Keep headache diary 6.  Exercise, hydration, caffeine cessation, sleep hygiene, monitor for and avoid triggers 7. Follow up 4 months   Follow Up Instructions:    -I discussed the assessment and treatment plan  with the patient. The patient was provided an opportunity to ask questions and all were answered. The patient agreed with the plan and demonstrated an understanding of the instructions.   The patient was advised to call back or seek an in-person evaluation if the symptoms worsen or if the condition fails to improve as anticipated.    Dudley Major, DO

## 2019-10-18 ENCOUNTER — Other Ambulatory Visit: Payer: Self-pay | Admitting: Cardiology

## 2019-10-19 ENCOUNTER — Telehealth (INDEPENDENT_AMBULATORY_CARE_PROVIDER_SITE_OTHER): Payer: Medicare Other | Admitting: Neurology

## 2019-10-19 ENCOUNTER — Other Ambulatory Visit: Payer: Self-pay

## 2019-10-19 VITALS — Ht 63.0 in | Wt 130.0 lb

## 2019-10-19 DIAGNOSIS — I5022 Chronic systolic (congestive) heart failure: Secondary | ICD-10-CM | POA: Diagnosis not present

## 2019-10-19 DIAGNOSIS — G43709 Chronic migraine without aura, not intractable, without status migrainosus: Secondary | ICD-10-CM

## 2019-10-19 DIAGNOSIS — J9611 Chronic respiratory failure with hypoxia: Secondary | ICD-10-CM | POA: Diagnosis not present

## 2019-10-19 DIAGNOSIS — I2510 Atherosclerotic heart disease of native coronary artery without angina pectoris: Secondary | ICD-10-CM | POA: Diagnosis not present

## 2019-10-19 DIAGNOSIS — G253 Myoclonus: Secondary | ICD-10-CM

## 2019-10-19 MED ORDER — TOPIRAMATE 50 MG PO TABS: 100.0000 mg | ORAL_TABLET | Freq: Every day | ORAL | 5 refills | Status: DC

## 2019-10-21 ENCOUNTER — Telehealth: Payer: Self-pay

## 2019-10-21 NOTE — Telephone Encounter (Signed)
Recommend video chat or see lp

## 2019-10-21 NOTE — Telephone Encounter (Signed)
Patient called and had covid 4 weeks ago, but is having cough and congestion in the chest. Has taken mucinex.

## 2019-10-21 NOTE — Telephone Encounter (Signed)
Call x 1 no voicemail,

## 2019-10-24 DIAGNOSIS — T50905A Adverse effect of unspecified drugs, medicaments and biological substances, initial encounter: Secondary | ICD-10-CM

## 2019-10-24 DIAGNOSIS — F331 Major depressive disorder, recurrent, moderate: Secondary | ICD-10-CM | POA: Insufficient documentation

## 2019-10-24 DIAGNOSIS — Z96642 Presence of left artificial hip joint: Secondary | ICD-10-CM | POA: Insufficient documentation

## 2019-10-24 DIAGNOSIS — M4726 Other spondylosis with radiculopathy, lumbar region: Secondary | ICD-10-CM | POA: Insufficient documentation

## 2019-10-24 DIAGNOSIS — G253 Myoclonus: Secondary | ICD-10-CM

## 2019-10-24 DIAGNOSIS — M810 Age-related osteoporosis without current pathological fracture: Secondary | ICD-10-CM | POA: Insufficient documentation

## 2019-10-24 DIAGNOSIS — Z79891 Long term (current) use of opiate analgesic: Secondary | ICD-10-CM

## 2019-10-24 DIAGNOSIS — F321 Major depressive disorder, single episode, moderate: Secondary | ICD-10-CM | POA: Insufficient documentation

## 2019-10-24 DIAGNOSIS — J9611 Chronic respiratory failure with hypoxia: Secondary | ICD-10-CM | POA: Insufficient documentation

## 2019-10-24 DIAGNOSIS — G43001 Migraine without aura, not intractable, with status migrainosus: Secondary | ICD-10-CM | POA: Insufficient documentation

## 2019-10-24 HISTORY — DX: Major depressive disorder, single episode, moderate: F32.1

## 2019-10-24 HISTORY — DX: Presence of left artificial hip joint: Z96.642

## 2019-10-24 HISTORY — DX: Chronic respiratory failure with hypoxia: J96.11

## 2019-10-24 HISTORY — DX: Other spondylosis with radiculopathy, lumbar region: M47.26

## 2019-10-24 HISTORY — DX: Myoclonus: G25.3

## 2019-10-24 HISTORY — DX: Long term (current) use of opiate analgesic: Z79.891

## 2019-10-24 HISTORY — DX: Migraine without aura, not intractable, with status migrainosus: G43.001

## 2019-10-24 HISTORY — DX: Major depressive disorder, recurrent, moderate: F33.1

## 2019-10-24 HISTORY — DX: Adverse effect of unspecified drugs, medicaments and biological substances, initial encounter: T50.905A

## 2019-10-24 HISTORY — DX: Age-related osteoporosis without current pathological fracture: M81.0

## 2019-10-25 ENCOUNTER — Telehealth (INDEPENDENT_AMBULATORY_CARE_PROVIDER_SITE_OTHER): Payer: Medicare Other | Admitting: Legal Medicine

## 2019-10-25 ENCOUNTER — Ambulatory Visit: Payer: Medicare Other | Admitting: Cardiology

## 2019-10-25 ENCOUNTER — Encounter: Payer: Self-pay | Admitting: Legal Medicine

## 2019-10-25 DIAGNOSIS — J441 Chronic obstructive pulmonary disease with (acute) exacerbation: Secondary | ICD-10-CM | POA: Insufficient documentation

## 2019-10-25 DIAGNOSIS — Z87891 Personal history of nicotine dependence: Secondary | ICD-10-CM

## 2019-10-25 DIAGNOSIS — Z8616 Personal history of COVID-19: Secondary | ICD-10-CM

## 2019-10-25 HISTORY — DX: Chronic obstructive pulmonary disease with (acute) exacerbation: J44.1

## 2019-10-25 MED ORDER — LEVOFLOXACIN 500 MG PO TABS: 500.0000 mg | ORAL_TABLET | Freq: Every day | ORAL | 0 refills | Status: DC

## 2019-10-25 MED ORDER — PREDNISONE 5 MG PO TABS: 5.0000 mg | ORAL_TABLET | Freq: Every day | ORAL | 0 refills | Status: DC

## 2019-10-25 NOTE — Progress Notes (Signed)
Acute Office Visit  Subjective:    Patient ID: [name], female    DOB: Feb 02, 1956, 63 y.o.   MRN: [id number]   Patient was contacted with telephone visit per patient request.  Chief Complaint  Patient presents with  . Cough    She had covid 2 weeks ago  . Shortness of Breath  . COPD    HPI Patient is in today for COPD with exacerbation. She had covid 2 weeks ago and is still coughing with no productive sputum and low grade fever.  She has history of COPD.  She is using her inhalers regularly.  Past Medical History:  Diagnosis Date  . AICD (automatic cardioverter/defibrillator) present 2012   . Arthritis   . Arthritis   . Asthma   . CHF (congestive heart failure) ([place])   . Chronic back pain   . COPD (chronic obstructive pulmonary disease) ([place])   . GERD (gastroesophageal reflux disease)   . Heart failure   . HLD (hyperlipidemia)   . Hypertension   . ICD (implantable cardiac defibrillator) in place 2012  . Left ventricular systolic dysfunction   . Myocardial infarction [facility])    2011 during intestinal blockage surgery  . NICM (nonischemic cardiomyopathy) ([place])   . PTSD (post-traumatic stress disorder)   . PVCs (premature ventricular contractions)   . Thyroid disease     Past Surgical History:  Procedure Laterality Date  . APPENDECTOMY    . CARDIAC CATHETERIZATION    . CARPAL TUNNEL RELEASE    . CESAREAN SECTION    . CHOLECYSTECTOMY    . CORONARY ANGIOPLASTY    . HAND SURGERY    . ICD GENERATOR CHANGEOUT N/A 05/21/2019   Procedure: ICD GENERATOR CHANGEOUT;  Surgeon: Constance Haw, MD;  Location: [place] [facility];  Service: Cardiovascular;  Laterality: N/A;  . ICD IMPLANT     [employer]  . intestinal blockage 2011    . LAPAROSCOPIC ASSISTED VENTRAL HERNIA REPAIR N/A 03/23/2015   Procedure: LAPAROSCOPIC VENTRAL WALL HERNIA REPAIR;  Surgeon: Michael Boston, MD;  Location: [facility];  Service: General;  Laterality: N/A;  With MESH  . LAPAROSCOPIC LYSIS OF  ADHESIONS N/A 03/23/2015   Procedure: LAPAROSCOPIC LYSIS OF ADHESIONS;  Surgeon: Michael Boston, MD;  Location: [facility];  Service: General;  Laterality: N/A;  . NECK SURGERY     fused  . TONSILLECTOMY    . ULNAR NERVE TRANSPOSITION  01/23/2012   Procedure: ULNAR NERVE DECOMPRESSION/TRANSPOSITION;  Surgeon: Ophelia Charter, MD;  Location: [place] [facility];  Service: Neurosurgery;  Laterality: Left;  LEFT ulnar nerve decompression    Family History  Problem Relation Age of Onset  . Heart attack Other   . Cancer Other   . Heart failure Other   . Hypertension Mother   . Heart attack Mother   . Alcohol abuse Father   . Hypertension Father   . Heart attack Father   . Anesthesia problems Neg Hx   . Hypotension Neg Hx   . Malignant hyperthermia Neg Hx   . Pseudochol deficiency Neg Hx     Social History   Socioeconomic History  . Marital status: Single    Spouse name: Not on file  . Number of children: 3  . Years of education: Not on file  . Highest education level: Not on file  Occupational History  . Occupation: disabled  Tobacco Use  . Smoking status: Former Smoker    Packs/day: 1.00    Years: 30.00  Pack years: 30.00    Types: Cigarettes    Quit date: 08/11/2010    Years since quitting: 9.2  . Smokeless tobacco: Never Used  Substance and Sexual Activity  . Alcohol use: No  . Drug use: No  . Sexual activity: Not Currently  Other Topics Concern  . Not on file  Social History Narrative   One level home with boyfriend   Caffeine - coffee 1-2 cups/day; Green tea 4-5 bottles a day   Exercise - some    Right handed      Social Determinants of Health   Financial Resource Strain:   . Difficulty of Paying Living Expenses: Not on file  Food Insecurity:   . Worried About Charity fundraiser in the Last Year: Not on file  . Ran Out of Food in the Last Year: Not on file  Transportation Needs:   . Lack of Transportation (Medical): Not on file  . Lack of Transportation  (Non-Medical): Not on file  Physical Activity:   . Days of Exercise per Week: Not on file  . Minutes of Exercise per Session: Not on file  Stress:   . Feeling of Stress : Not on file  Social Connections:   . Frequency of Communication with Friends and Family: Not on file  . Frequency of Social Gatherings with Friends and Family: Not on file  . Attends Religious Services: Not on file  . Active Member of Clubs or Organizations: Not on file  . Attends Archivist Meetings: Not on file  . Marital Status: Not on file  Intimate Partner Violence:   . Fear of Current or Ex-Partner: Not on file  . Emotionally Abused: Not on file  . Physically Abused: Not on file  . Sexually Abused: Not on file    Outpatient Medications Prior to Visit  Medication Sig Dispense Refill  . albuterol-ipratropium (COMBIVENT) 18-103 MCG/ACT inhaler Inhale 2 puffs into the lungs every 6 (six) hours as needed for wheezing or shortness of breath.     Marland Kitchen alendronate (FOSAMAX) 70 MG tablet Take 70 mg by mouth every Monday. Take with a full glass of water on an empty stomach.    Marland Kitchen aspirin 81 MG tablet Take 81 mg by mouth daily.      . Buprenorphine HCl (BELBUCA) 900 MCG FILM Place 900 mcg inside cheek every 12 (twelve) hours.    . carvedilol (COREG) 3.125 MG tablet Take 1 tablet (3.125 mg total) by mouth 2 (two) times daily with a meal. 180 tablet 3  . celecoxib (CELEBREX) 200 MG capsule Take 200 mg by mouth daily.   3  . diazepam (VALIUM) 5 MG tablet Take 5 mg by mouth 3 (three) times daily as needed.    Marland Kitchen ENTRESTO 24-26 MG TAKE 1 TABLET BY MOUTH TWICE DAILY 180 tablet 1  . esomeprazole (NEXIUM) 20 MG capsule Take 20 mg by mouth daily.     . fluticasone (FLONASE) 50 MCG/ACT nasal spray Place 2 sprays into both nostrils daily as needed (congestion).     . fluticasone furoate-vilanterol (BREO ELLIPTA) 100-25 MCG/INH AEPB Inhale 1 puff into the lungs daily as needed (shortness).     . furosemide (LASIX) 40 MG tablet  Take 1 tablet daily. If you gain 3 pounds in 1 day or 5 pounds in 2 days, take 0.5 tablet (20 mg) in the afternoon. 30 tablet   . gabapentin (NEURONTIN) 800 MG tablet Take 800 mg by mouth 3 (three) times daily. Patient  usually takes twice a day  2  . ipratropium (ATROVENT) 0.02 % nebulizer solution Take 0.5 mg by nebulization every 4 (four) hours as needed for wheezing or shortness of breath.    . linaclotide (LINZESS) 145 MCG CAPS capsule Take 145 mcg by mouth daily.     . nitroGLYCERIN (NITROSTAT) 0.4 MG SL tablet Place 1 tablet (0.4 mg total) under the tongue every 5 (five) minutes as needed for chest pain. 25 tablet 6  . Oxycodone HCl 10 MG TABS Take 1 tablet by mouth every 4 (four) hours as needed.    . ranolazine (RANEXA) 500 MG 12 hr tablet TAKE 1 TABLET(500 MG) BY MOUTH TWICE DAILY 60 tablet 0  . rosuvastatin (CRESTOR) 20 MG tablet Take 20 mg by mouth daily.    . sertraline (ZOLOFT) 50 MG tablet Take 50 mg by mouth daily.    Marland Kitchen topiramate (TOPAMAX) 50 MG tablet Take 2 tablets (100 mg total) by mouth at bedtime. 60 tablet 5  . Ubrogepant (UBRELVY) 100 MG TABS Take 1 tablet by mouth as needed (May repeat 1 tablet after 2 hours if needed.  Maximum 2 tablets in 24 hours). 16 tablet 3   No facility-administered medications prior to visit.    No Known Allergies  Review of Systems  Constitutional: Negative.   HENT: Negative.   Eyes: Negative.   Respiratory: Positive for cough and shortness of breath.   Cardiovascular: Negative.   Genitourinary: Negative.   Neurological: Negative.        Objective:    Physical Exam No exam, telephone visit  BP 109/73   Pulse (!) 57   Temp 99 F (37.2 C) (Oral)   Ht 5' (1.524 m)   Wt 130 lb (59 kg)   BMI 25.39 kg/m  Wt Readings from Last 3 Encounters:  10/25/19 130 lb (59 kg)  10/15/19 130 lb (59 kg)  09/13/19 139 lb 3.2 oz (63.1 kg)    Health Maintenance Due  Topic Date Due  . Hepatitis C Screening  [birth year], 1953  . HIV Screening   07/03/1969  . TETANUS/TDAP  07/03/1973  . PAP SMEAR-Modifier  07/04/1975  . MAMMOGRAM  07/03/2004  . COLONOSCOPY  07/03/2004  . INFLUENZA VACCINE  03/27/2019  . DEXA SCAN  07/04/2019  . PNA vac Low Risk Adult (1 of 2 - PCV13) 07/04/2019    There are no preventive care reminders to display for this patient.   Lab Results  Component Value Date   TSH 1.530 07/20/2019   Lab Results  Component Value Date   WBC 8.6 04/29/2019   HGB 10.8 (L) 04/29/2019   HCT 32.9 (L) 04/29/2019   MCV 93 04/29/2019   PLT 307 04/29/2019   Lab Results  Component Value Date   NA 143 07/20/2019   K 4.7 07/20/2019   CO2 22 07/20/2019   GLUCOSE 85 07/20/2019   BUN 17 07/20/2019   CREATININE 0.90 07/20/2019   BILITOT 0.6 03/26/2015   ALKPHOS 62 03/26/2015   AST 17 03/26/2015   ALT 14 03/26/2015   PROT 6.7 03/26/2015   ALBUMIN 3.3 (L) 03/26/2015   CALCIUM 9.4 07/20/2019   ANIONGAP 7 03/27/2015   Lab Results  Component Value Date   CHOL 170 06/30/2017   Lab Results  Component Value Date   HDL 64 06/30/2017   Lab Results  Component Value Date   LDLCALC 74 06/30/2017   Lab Results  Component Value Date   TRIG 160 (H) 06/30/2017   Lab Results  Component Value Date   CHOLHDL 2.7 06/30/2017   No results found for: HGBA1C     Assessment & Plan:   Problem List Items Addressed This Visit      Respiratory   Obstructive chronic bronchitis with exacerbation [facility])    An individualize plan was formulated for care of COPD.  Treatment is evidence based.  She will continue on inhalers, avoid smoking and smoke.  Regular exercise with help with dyspnea. Routine follow ups and medication compliance is needed. I added prednisone 5mg  pack and levaquin for infection.      Relevant Medications   levofloxacin (LEVAQUIN) 500 MG tablet   predniSONE (DELTASONE) 5 MG tablet       Meds ordered this encounter  Medications  . levofloxacin (LEVAQUIN) 500 MG tablet    Sig: Take 1 tablet (500 mg  total) by mouth daily.    Dispense:  7 tablet    Refill:  0  . predniSONE (DELTASONE) 5 MG tablet    Sig: Take 1 tablet (5 mg total) by mouth daily with breakfast. Take 6ills first day , then 5 pills day 2 and then cut down one pill day until gone    Dispense:  21 tablet    Refill:  0     Reinaldo Meeker, MD

## 2019-10-25 NOTE — Assessment & Plan Note (Signed)
An individualize plan was formulated for care of COPD.  Treatment is evidence based.  She will continue on inhalers, avoid smoking and smoke.  Regular exercise with help with dyspnea. Routine follow ups and medication compliance is needed. I added prednisone 5mg  pack and levaquin for infection.

## 2019-11-04 ENCOUNTER — Other Ambulatory Visit: Payer: Self-pay | Admitting: Legal Medicine

## 2019-11-05 DIAGNOSIS — G8929 Other chronic pain: Secondary | ICD-10-CM | POA: Diagnosis not present

## 2019-11-05 DIAGNOSIS — M545 Low back pain: Secondary | ICD-10-CM | POA: Diagnosis not present

## 2019-11-10 DIAGNOSIS — J449 Chronic obstructive pulmonary disease, unspecified: Secondary | ICD-10-CM | POA: Diagnosis not present

## 2019-11-10 DIAGNOSIS — J441 Chronic obstructive pulmonary disease with (acute) exacerbation: Secondary | ICD-10-CM | POA: Diagnosis not present

## 2019-11-12 ENCOUNTER — Other Ambulatory Visit: Payer: Self-pay

## 2019-11-12 DIAGNOSIS — E785 Hyperlipidemia, unspecified: Secondary | ICD-10-CM

## 2019-11-12 DIAGNOSIS — T162XXA Foreign body in left ear, initial encounter: Secondary | ICD-10-CM | POA: Diagnosis not present

## 2019-11-12 DIAGNOSIS — H6981 Other specified disorders of Eustachian tube, right ear: Secondary | ICD-10-CM | POA: Diagnosis not present

## 2019-11-12 DIAGNOSIS — H6591 Unspecified nonsuppurative otitis media, right ear: Secondary | ICD-10-CM | POA: Diagnosis not present

## 2019-11-12 DIAGNOSIS — H938X1 Other specified disorders of right ear: Secondary | ICD-10-CM | POA: Diagnosis not present

## 2019-11-12 DIAGNOSIS — Z8616 Personal history of COVID-19: Secondary | ICD-10-CM | POA: Diagnosis not present

## 2019-11-12 MED ORDER — ROSUVASTATIN CALCIUM 20 MG PO TABS: 20.0000 mg | ORAL_TABLET | Freq: Every day | ORAL | 1 refills | Status: DC

## 2019-11-15 ENCOUNTER — Other Ambulatory Visit: Payer: Self-pay | Admitting: Cardiology

## 2019-11-15 DIAGNOSIS — I5022 Chronic systolic (congestive) heart failure: Secondary | ICD-10-CM | POA: Diagnosis not present

## 2019-11-15 DIAGNOSIS — J9611 Chronic respiratory failure with hypoxia: Secondary | ICD-10-CM | POA: Diagnosis not present

## 2019-11-15 DIAGNOSIS — I2510 Atherosclerotic heart disease of native coronary artery without angina pectoris: Secondary | ICD-10-CM | POA: Diagnosis not present

## 2019-11-18 ENCOUNTER — Telehealth: Payer: Self-pay | Admitting: Legal Medicine

## 2019-11-18 NOTE — Progress Notes (Signed)
  Chronic Care Management   Outreach Note  11/18/2019 Name: [name] MRN: [id number] DOB: 1956-02-25  Referred by: Lillard Anes, MD Reason for referral : No chief complaint on file.   An unsuccessful telephone outreach was attempted today. The patient was referred to the pharmacist for assistance with care management and care coordination.   Follow Up Plan:   Earney [place] Upstream Scheduler

## 2019-12-01 ENCOUNTER — Ambulatory Visit: Payer: Medicare Other | Admitting: Cardiology

## 2019-12-01 ENCOUNTER — Other Ambulatory Visit: Payer: Self-pay

## 2019-12-02 ENCOUNTER — Ambulatory Visit (INDEPENDENT_AMBULATORY_CARE_PROVIDER_SITE_OTHER): Payer: Medicare Other | Admitting: *Deleted

## 2019-12-02 ENCOUNTER — Ambulatory Visit (INDEPENDENT_AMBULATORY_CARE_PROVIDER_SITE_OTHER): Payer: Medicare Other | Admitting: Cardiology

## 2019-12-02 ENCOUNTER — Other Ambulatory Visit: Payer: Self-pay

## 2019-12-02 ENCOUNTER — Encounter: Payer: Self-pay | Admitting: Cardiology

## 2019-12-02 VITALS — BP 112/64 | HR 95 | Temp 98.6°F | Ht 60.0 in | Wt 144.2 lb

## 2019-12-02 DIAGNOSIS — R0609 Other forms of dyspnea: Secondary | ICD-10-CM

## 2019-12-02 DIAGNOSIS — I10 Essential (primary) hypertension: Secondary | ICD-10-CM

## 2019-12-02 DIAGNOSIS — I5022 Chronic systolic (congestive) heart failure: Secondary | ICD-10-CM | POA: Diagnosis not present

## 2019-12-02 DIAGNOSIS — H04123 Dry eye syndrome of bilateral lacrimal glands: Secondary | ICD-10-CM | POA: Diagnosis not present

## 2019-12-02 DIAGNOSIS — I2510 Atherosclerotic heart disease of native coronary artery without angina pectoris: Secondary | ICD-10-CM | POA: Diagnosis not present

## 2019-12-02 DIAGNOSIS — H5340 Unspecified visual field defects: Secondary | ICD-10-CM | POA: Diagnosis not present

## 2019-12-02 DIAGNOSIS — I420 Dilated cardiomyopathy: Secondary | ICD-10-CM

## 2019-12-02 DIAGNOSIS — R0600 Dyspnea, unspecified: Secondary | ICD-10-CM

## 2019-12-02 DIAGNOSIS — H5702 Anisocoria: Secondary | ICD-10-CM | POA: Diagnosis not present

## 2019-12-02 LAB — CUP PACEART REMOTE DEVICE CHECK
Battery Remaining Longevity: 125 mo
Battery Voltage: 3.03 V
Brady Statistic AP VP Percent: 0.02 %
Brady Statistic AP VS Percent: 16.93 %
Brady Statistic AS VP Percent: 0.03 %
Brady Statistic AS VS Percent: 83.02 %
Brady Statistic RA Percent Paced: 16.75 %
Brady Statistic RV Percent Paced: 0.05 %
Date Time Interrogation Session: 20210408033424
HighPow Impedance: 72 Ohm
Implantable Lead Implant Date: 20120502
Implantable Lead Implant Date: 20120502
Implantable Lead Location: 753859
Implantable Lead Location: 753860
Implantable Lead Model: 4076
Implantable Lead Model: 7122
Implantable Pulse Generator Implant Date: 20200925
Lead Channel Impedance Value: 361 Ohm
Lead Channel Impedance Value: 532 Ohm
Lead Channel Impedance Value: 665 Ohm
Lead Channel Pacing Threshold Amplitude: 0.5 V
Lead Channel Pacing Threshold Amplitude: 0.875 V
Lead Channel Pacing Threshold Pulse Width: 0.4 ms
Lead Channel Pacing Threshold Pulse Width: 0.4 ms
Lead Channel Sensing Intrinsic Amplitude: 10.125 mV
Lead Channel Sensing Intrinsic Amplitude: 10.125 mV
Lead Channel Sensing Intrinsic Amplitude: 3 mV
Lead Channel Sensing Intrinsic Amplitude: 3 mV
Lead Channel Setting Pacing Amplitude: 1.5 V
Lead Channel Setting Pacing Amplitude: 2 V
Lead Channel Setting Pacing Pulse Width: 0.4 ms
Lead Channel Setting Sensing Sensitivity: 0.3 mV

## 2019-12-02 NOTE — Progress Notes (Signed)
ICD Remote  

## 2019-12-02 NOTE — Patient Instructions (Signed)
Medication Instructions:  Your physician recommends that you continue on your current medications as directed. Please refer to the Current Medication list given to you today.  *If you need a refill on your cardiac medications before your next appointment, please call your pharmacy*   Lab Work: Your physician recommends that you return for lab work today: pro bnp, bmp   If you have labs (blood work) drawn today and your tests are completely normal, you will receive your results only by: Marland Kitchen MyChart Message (if you have MyChart) OR . A paper copy in the mail If you have any lab test that is abnormal or we need to change your treatment, we will call you to review the results.   Testing/Procedures: Your physician has requested that you have an echocardiogram. Echocardiography is a painless test that uses sound waves to create images of your heart. It provides your doctor with information about the size and shape of your heart and how well your heart's chambers and valves are working. This procedure takes approximately one hour. There are no restrictions for this procedure.     Follow-Up: At [facility], you and your health needs are our priority.  As part of our continuing mission to provide you with exceptional heart care, we have created designated Provider Care Teams.  These Care Teams include your primary Cardiologist (physician) and Advanced Practice Providers (APPs -  Physician Assistants and Nurse Practitioners) who all work together to provide you with the care you need, when you need it.  We recommend signing up for the patient portal called "MyChart".  Sign up information is provided on this After Visit Summary.  MyChart is used to connect with patients for Virtual Visits (Telemedicine).  Patients are able to view lab/test results, encounter notes, upcoming appointments, etc.  Non-urgent messages can be sent to your provider as well.   To learn more about what you can do with MyChart,  go to [email or web address].    Your next appointment:   2 month(s)  The format for your next appointment:   In Person  Provider:   Jenne Campus, MD   Other Instructions   Echocardiogram An echocardiogram is a procedure that uses painless sound waves (ultrasound) to produce an image of the heart. Images from an echocardiogram can provide important information about:  Signs of coronary artery disease (CAD).  Aneurysm detection. An aneurysm is a weak or damaged part of an artery wall that bulges out from the normal force of blood pumping through the body.  Heart size and shape. Changes in the size or shape of the heart can be associated with certain conditions, including heart failure, aneurysm, and CAD.  Heart muscle function.  Heart valve function.  Signs of a past heart attack.  Fluid buildup around the heart.  Thickening of the heart muscle.  A tumor or infectious growth around the heart valves. Tell a health care provider about:  Any allergies you have.  All medicines you are taking, including vitamins, herbs, eye drops, creams, and over-the-counter medicines.  Any blood disorders you have.  Any surgeries you have had.  Any medical conditions you have.  Whether you are pregnant or may be pregnant. What are the risks? Generally, this is a safe procedure. However, problems may occur, including:  Allergic reaction to dye (contrast) that may be used during the procedure. What happens before the procedure? No specific preparation is needed. You may eat and drink normally. What happens during the procedure?   An  IV tube may be inserted into one of your veins.  You may receive contrast through this tube. A contrast is an injection that improves the quality of the pictures from your heart.  A gel will be applied to your chest.  A wand-like tool (transducer) will be moved over your chest. The gel will help to transmit the sound waves from the transducer.   The sound waves will harmlessly bounce off of your heart to allow the heart images to be captured in real-time motion. The images will be recorded on a computer. The procedure may vary among health care providers and hospitals. What happens after the procedure?  You may return to your normal, everyday life, including diet, activities, and medicines, unless your health care provider tells you not to do that. Summary  An echocardiogram is a procedure that uses painless sound waves (ultrasound) to produce an image of the heart.  Images from an echocardiogram can provide important information about the size and shape of your heart, heart muscle function, heart valve function, and fluid buildup around your heart.  You do not need to do anything to prepare before this procedure. You may eat and drink normally.  After the echocardiogram is completed, you may return to your normal, everyday life, unless your health care provider tells you not to do that. This information is not intended to replace advice given to you by your health care provider. Make sure you discuss any questions you have with your health care provider. Document Revised: 12/03/2018 Document Reviewed: 09/14/2016 Elsevier Patient Education  [employer].

## 2019-12-02 NOTE — Progress Notes (Signed)
Cardiology Office Note:    Date:  12/02/2019   ID:  [name], DOB 01-08-1956, MRN [id number]  PCP:  Lillard Anes, MD  Cardiologist:  Jenne Campus, MD    Referring MD: Lillard Anes,*   No chief complaint on file. I have swollen legs  History of Present Illness:    [name] is a 64 y.o. female with past medical history significant for cardiomyopathy,Ejection fraction from February 2020 of 30 to 35%, ICD present, essential hypertension.  Comes today 2 months for follow-up.  She complained of having swollen legs.  Also some shortness of breath.  Luckily no proximal nocturnal dyspnea.  In January she did have COVID-19 infection likely recovered from it quite nicely.  Since that time she noticed swelling of lower extremities.  Past Medical History:  Diagnosis Date  . Admission for long-term opiate analgesic use 10/24/2019  . AICD (automatic cardioverter/defibrillator) present 2012   . Arthritis   . Arthritis   . Arthritis of right hip 05/29/2016   Formatting of this note might be different from the original. Added automatically from request for surgery [id number]  . Asthma   . Cardiomyopathy [facility])    Overview:  Ejection fraction 45% in 2015 Ejection fraction 30 to 35% in November 2018  . CHF (congestive heart failure) ([place])   . Chronic back pain   . Chronic hypoxemic respiratory failure ([place]) 10/24/2019  . Chronic narcotic use 03/23/2015  . Chronic pain of both knees 12/21/2018   Added automatically from request for surgery [id number]  Formatting of this note might be different from the original. Added automatically from request for surgery [id number]  . Chronic systolic congestive heart failure, NYHA class 2 ([place]) 06/19/2017  . COPD (chronic obstructive pulmonary disease) ([place])   . Coronary artery disease involving native coronary artery of native heart without angina pectoris 05/31/2015   Overview:  Abnormal stress test in fall of 2016, cardiac catheterization showed  normal coronaries.  . Dilated cardiomyopathy ([place]) 06/19/2017  . 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 Dyslipidemia 05/31/2015  . Essential hypertension 05/31/2015  . GERD (gastroesophageal reflux disease)   . Heart failure   . HLD (hyperlipidemia)   . Hypertension   . ICD (implantable cardiac defibrillator) in place 2012  . Ileus following gastrointestinal surgery ([place]) 03/26/2015  . Left ventricular systolic dysfunction   . Major depressive disorder, single episode, moderate ([place]) 10/24/2019  . Migraine without aura with status migrainosus 10/24/2019  . Myocardial infarction [facility])    2011 during intestinal blockage surgery  . Nausea alone 04/19/2014  . NICM (nonischemic cardiomyopathy) ([place])   . Obstructive chronic bronchitis with exacerbation ([place]) 10/25/2019  . Other spondylosis with radiculopathy, lumbar region 10/24/2019  . Presence of left artificial hip joint 10/24/2019  . PTSD (post-traumatic stress disorder)   . PVCs (premature ventricular contractions)   . 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 Senile osteoporosis 10/24/2019  . Thyroid disease     Past Surgical History:  Procedure Laterality Date  . APPENDECTOMY    . CARDIAC CATHETERIZATION    . CARPAL TUNNEL RELEASE    . CESAREAN SECTION    . CHOLECYSTECTOMY    . CORONARY ANGIOPLASTY    . HAND SURGERY    . ICD GENERATOR CHANGEOUT N/A 05/21/2019   Procedure: ICD GENERATOR CHANGEOUT;  Surgeon: Constance Haw, MD;  Location: [facility] CV LAB;  Service:  Cardiovascular;  Laterality: N/A;  . ICD IMPLANT     [employer]  . intestinal blockage 2011    . LAPAROSCOPIC ASSISTED VENTRAL HERNIA REPAIR N/A 03/23/2015   Procedure: LAPAROSCOPIC VENTRAL WALL HERNIA REPAIR;  Surgeon: Michael Boston, MD;  Location: [facility] ORS;  Service: General;  Laterality: N/A;  With MESH  . LAPAROSCOPIC LYSIS OF ADHESIONS N/A  03/23/2015   Procedure: LAPAROSCOPIC LYSIS OF ADHESIONS;  Surgeon: Michael Boston, MD;  Location: [facility] ORS;  Service: General;  Laterality: N/A;  . NECK SURGERY     fused  . TONSILLECTOMY    . ULNAR NERVE TRANSPOSITION  01/23/2012   Procedure: ULNAR NERVE DECOMPRESSION/TRANSPOSITION;  Surgeon: Ophelia Charter, MD;  Location: [place] [facility] NEURO ORS;  Service: Neurosurgery;  Laterality: Left;  LEFT ulnar nerve decompression    Current Medications: Current Meds  Medication Sig  . albuterol-ipratropium (COMBIVENT) 18-103 MCG/ACT inhaler Inhale 2 puffs into the lungs every 6 (six) hours as needed for wheezing or shortness of breath.   Marland Kitchen alendronate (FOSAMAX) 70 MG tablet Take 70 mg by mouth every Monday. Take with a full glass of water on an empty stomach.  Marland Kitchen aspirin 81 MG tablet Take 81 mg by mouth daily.    . Buprenorphine HCl (BELBUCA) 900 MCG FILM Place 900 mcg inside cheek every 12 (twelve) hours.  . carvedilol (COREG) 3.125 MG tablet TAKE 1 TABLET BY MOUTH TWICE DAILY  . celecoxib (CELEBREX) 200 MG capsule Take 200 mg by mouth daily.   . diazepam (VALIUM) 5 MG tablet Take 5 mg by mouth 3 (three) times daily as needed.  Marland Kitchen ENTRESTO 24-26 MG TAKE 1 TABLET BY MOUTH TWICE DAILY  . esomeprazole (NEXIUM) 20 MG capsule Take 20 mg by mouth daily.   . fluticasone (FLONASE) 50 MCG/ACT nasal spray Place 2 sprays into both nostrils daily as needed (congestion).   . fluticasone furoate-vilanterol (BREO ELLIPTA) 100-25 MCG/INH AEPB Inhale 1 puff into the lungs daily as needed (shortness).   . furosemide (LASIX) 40 MG tablet Take 1 tablet daily. If you gain 3 pounds in 1 day or 5 pounds in 2 days, take 0.5 tablet (20 mg) in the afternoon.  . gabapentin (NEURONTIN) 800 MG tablet Take 800 mg by mouth 3 (three) times daily. Patient usually takes twice a day  . ipratropium (ATROVENT) 0.02 % nebulizer solution Take 0.5 mg by nebulization every 4 (four) hours as needed for wheezing or shortness of breath.  . levofloxacin  (LEVAQUIN) 500 MG tablet Take 1 tablet (500 mg total) by mouth daily.  Marland Kitchen LINZESS 145 MCG CAPS capsule TAKE 1 CAPSULE BY MOUTH EVERY DAY  . nitroGLYCERIN (NITROSTAT) 0.4 MG SL tablet Place 1 tablet (0.4 mg total) under the tongue every 5 (five) minutes as needed for chest pain.  . Oxycodone HCl 10 MG TABS Take 1 tablet by mouth every 4 (four) hours as needed.  Marland Kitchen oxyCODONE-acetaminophen (PERCOCET) 10-325 MG tablet Take 1 tablet by mouth 4 (four) times daily as needed.  . ranolazine (RANEXA) 500 MG 12 hr tablet TAKE 1 TABLET(500 MG) BY MOUTH TWICE DAILY  . rosuvastatin (CRESTOR) 20 MG tablet Take 1 tablet (20 mg total) by mouth daily.  . sertraline (ZOLOFT) 50 MG tablet Take 50 mg by mouth daily.  Marland Kitchen topiramate (TOPAMAX) 50 MG tablet Take 2 tablets (100 mg total) by mouth at bedtime.  Marland Kitchen Ubrogepant (UBRELVY) 100 MG TABS Take 1 tablet by mouth as needed (May repeat 1 tablet after 2 hours if needed.  Maximum 2  tablets in 24 hours).     Allergies:   Patient has no known allergies.   Social History   Socioeconomic History  . Marital status: Single    Spouse name: Not on file  . Number of children: 3  . Years of education: Not on file  . Highest education level: Not on file  Occupational History  . Occupation: disabled  Tobacco Use  . Smoking status: Former Smoker    Packs/day: 1.00    Years: 30.00    Pack years: 30.00    Types: Cigarettes    Quit date: 08/11/2010    Years since quitting: 9.3  . Smokeless tobacco: Never Used  Substance and Sexual Activity  . Alcohol use: No  . Drug use: No  . Sexual activity: Not Currently  Other Topics Concern  . Not on file  Social History Narrative   One level home with boyfriend   Caffeine - coffee 1-2 cups/day; Green tea 4-5 bottles a day   Exercise - some    Right handed      Social Determinants of Health   Financial Resource Strain:   . Difficulty of Paying Living Expenses:   Food Insecurity:   . Worried About Charity fundraiser in  the Last Year:   . Arboriculturist in the Last Year:   Transportation Needs:   . Film/video editor (Medical):   Marland Kitchen Lack of Transportation (Non-Medical):   Physical Activity:   . Days of Exercise per Week:   . Minutes of Exercise per Session:   Stress:   . Feeling of Stress :   Social Connections:   . Frequency of Communication with Friends and Family:   . Frequency of Social Gatherings with Friends and Family:   . Attends Religious Services:   . Active Member of Clubs or Organizations:   . Attends Archivist Meetings:   Marland Kitchen Marital Status:      Family History: The patient's family history includes Alcohol abuse in her father; Cancer in an other family member; Heart attack in her father, mother, and another family member; Heart failure in an other family member; Hypertension in her father and mother. There is no history of Anesthesia problems, Hypotension, Malignant hyperthermia, or Pseudochol deficiency. ROS:   Please see the history of present illness.    All 14 point review of systems negative except as described per history of present illness  EKGs/Labs/Other Studies Reviewed:      Recent Labs: 04/29/2019: Hemoglobin 10.8; Platelets 307 07/20/2019: BUN 17; Creatinine, Ser 0.90; NT-Pro BNP 1,644; Potassium 4.7; Sodium 143; TSH 1.530  Recent Lipid Panel    Component Value Date/Time   CHOL 170 06/30/2017 0000   TRIG 160 (H) 06/30/2017 0000   HDL 64 06/30/2017 0000   CHOLHDL 2.7 06/30/2017 0000   LDLCALC 74 06/30/2017 0000    Physical Exam:    VS:  BP 112/64   Pulse 95   Temp 98.6 F (37 C)   Ht 5' (1.524 m)   Wt 144 lb 3.2 oz (65.4 kg)   SpO2 95%   BMI 28.16 kg/m     Wt Readings from Last 3 Encounters:  12/02/19 144 lb 3.2 oz (65.4 kg)  10/25/19 130 lb (59 kg)  10/15/19 130 lb (59 kg)     GEN:  Well nourished, well developed in no acute distress HEENT: Normal NECK: No JVD; No carotid bruits LYMPHATICS: No lymphadenopathy CARDIAC: RRR, no  murmurs, no rubs, no  gallops RESPIRATORY:  Clear to auscultation without rales, wheezing or rhonchi  ABDOMEN: Soft, non-tender, non-distended MUSCULOSKELETAL:  No edema; No deformity  SKIN: Warm and dry LOWER EXTREMITIES: 1+ swelling NEUROLOGIC:  Alert and oriented x 3 PSYCHIATRIC:  Normal affect   ASSESSMENT:    1. Dilated cardiomyopathy ([place])   2. Chronic systolic congestive heart failure, NYHA class 2 ([place])   3. Coronary artery disease involving native coronary artery of native heart without angina pectoris   4. Essential hypertension    PLAN:    In order of problems listed above:  1. Dilated cardiomyopathy on appropriate medications that she can tolerate.  The biggest obstacle that I have is problem with her blood pressure being low.  today check her Chem-7.  Based on that we will decide if we can use a little more diuretic.  We also spoke in length about dietary habits and I noticed that she drinks a lot of fluids because she was told many years ago if she got swollen neck she to drink a lot of fluid which should help with the swelling.  I told her this is a mistake and she need to cut down her fluid.  I will also schedule her to have an echocardiogram to reassess left ventricle ejection fraction. 2. Chronic systolic congestive heart failure [place] Heart Association class II/III.  Plan as outlined above. 3. Coronary disease stable no new issues. 4. Essential hypertension, with pacing opposite problem blood pressure below. 5. ICD present, I did review interrogation of the device 10 years left.  One short episode of nonsustained ventricular tachycardia recorded only 7 beats.  No treatment needed.  We will continue with beta-blocker.   Medication Adjustments/Labs and Tests Ordered: Current medicines are reviewed at length with the patient today.  Concerns regarding medicines are outlined above.  No orders of the defined types were placed in this encounter.  Medication changes: No  orders of the defined types were placed in this encounter.   Signed, Park Liter, MD, [facility] 12/02/2019 10:49 AM    [place]

## 2019-12-03 DIAGNOSIS — R5383 Other fatigue: Secondary | ICD-10-CM | POA: Diagnosis not present

## 2019-12-03 DIAGNOSIS — M545 Low back pain: Secondary | ICD-10-CM | POA: Diagnosis not present

## 2019-12-03 DIAGNOSIS — Z1159 Encounter for screening for other viral diseases: Secondary | ICD-10-CM | POA: Diagnosis not present

## 2019-12-03 DIAGNOSIS — E7800 Pure hypercholesterolemia, unspecified: Secondary | ICD-10-CM | POA: Diagnosis not present

## 2019-12-03 DIAGNOSIS — E559 Vitamin D deficiency, unspecified: Secondary | ICD-10-CM | POA: Diagnosis not present

## 2019-12-03 DIAGNOSIS — M129 Arthropathy, unspecified: Secondary | ICD-10-CM | POA: Diagnosis not present

## 2019-12-03 DIAGNOSIS — M5136 Other intervertebral disc degeneration, lumbar region: Secondary | ICD-10-CM | POA: Diagnosis not present

## 2019-12-03 DIAGNOSIS — G8929 Other chronic pain: Secondary | ICD-10-CM | POA: Diagnosis not present

## 2019-12-03 DIAGNOSIS — R300 Dysuria: Secondary | ICD-10-CM | POA: Diagnosis not present

## 2019-12-03 DIAGNOSIS — Z79899 Other long term (current) drug therapy: Secondary | ICD-10-CM | POA: Diagnosis not present

## 2019-12-03 DIAGNOSIS — D539 Nutritional anemia, unspecified: Secondary | ICD-10-CM | POA: Diagnosis not present

## 2019-12-03 DIAGNOSIS — R0602 Shortness of breath: Secondary | ICD-10-CM | POA: Diagnosis not present

## 2019-12-03 LAB — BASIC METABOLIC PANEL
BUN/Creatinine Ratio: 13 (ref 12–28)
BUN: 14 mg/dL (ref 8–27)
CO2: 26 mmol/L (ref 20–29)
Calcium: 9.8 mg/dL (ref 8.7–10.3)
Chloride: 103 mmol/L (ref 96–106)
Creatinine, Ser: 1.05 mg/dL — ABNORMAL HIGH (ref 0.57–1.00)
GFR calc Af Amer: 64 mL/min/{1.73_m2} (ref >59)
GFR calc non Af Amer: 56 mL/min/{1.73_m2} — ABNORMAL LOW (ref >59)
Glucose: 78 mg/dL (ref 65–99)
Potassium: 4.1 mmol/L (ref 3.5–5.2)
Sodium: 142 mmol/L (ref 134–144)

## 2019-12-03 LAB — PRO B NATRIURETIC PEPTIDE: NT-Pro BNP: 1044 pg/mL — ABNORMAL HIGH (ref 0–301)

## 2019-12-08 ENCOUNTER — Telehealth: Payer: Self-pay | Admitting: Cardiology

## 2019-12-08 NOTE — Telephone Encounter (Signed)
    Patient calling for lab results 

## 2019-12-08 NOTE — Telephone Encounter (Signed)
Looked in chart did not see that the lab results were resulted yet. I will route to primary nurse to advise if she knows anything about these.   Thank you!

## 2019-12-09 ENCOUNTER — Telehealth: Payer: Self-pay

## 2019-12-09 ENCOUNTER — Telehealth: Payer: Self-pay | Admitting: Legal Medicine

## 2019-12-09 NOTE — Progress Notes (Signed)
°  Chronic Care Management   Outreach Note  12/09/2019 Name: [name] MRN: [id number] DOB: 1955-12-24  Referred by: Lillard Anes, MD Reason for referral : No chief complaint on file.   An unsuccessful telephone outreach was attempted today. The patient was referred to the pharmacist for assistance with care management and care coordination.   Follow Up Plan:   Earney Hamburg Upstream Scheduler

## 2019-12-09 NOTE — Telephone Encounter (Signed)
Tried calling patient. No answer and no voicemail set up for me to leave a message. 

## 2019-12-09 NOTE — Telephone Encounter (Signed)
Patient returning call.

## 2019-12-09 NOTE — Telephone Encounter (Signed)
Spoke with patient regarding results and recommendation.  Patient verbalizes understanding and is agreeable to plan of care. Advised patient to call back with any issues or concerns.  

## 2019-12-09 NOTE — Telephone Encounter (Signed)
-----   Message from Park Liter, MD sent at 12/09/2019 12:50 PM EDT ----- Laboratory test still showing some evidence of congestive heart failure but better than before.  I spoke to her last time and asked her to cut down some fluids hopefully that will be sufficient to help [place].

## 2019-12-10 DIAGNOSIS — J441 Chronic obstructive pulmonary disease with (acute) exacerbation: Secondary | ICD-10-CM | POA: Diagnosis not present

## 2019-12-11 DIAGNOSIS — J449 Chronic obstructive pulmonary disease, unspecified: Secondary | ICD-10-CM | POA: Diagnosis not present

## 2019-12-13 DIAGNOSIS — H938X1 Other specified disorders of right ear: Secondary | ICD-10-CM | POA: Diagnosis not present

## 2019-12-13 DIAGNOSIS — I5022 Chronic systolic (congestive) heart failure: Secondary | ICD-10-CM | POA: Diagnosis not present

## 2019-12-13 DIAGNOSIS — H6981 Other specified disorders of Eustachian tube, right ear: Secondary | ICD-10-CM | POA: Diagnosis not present

## 2019-12-13 DIAGNOSIS — H6521 Chronic serous otitis media, right ear: Secondary | ICD-10-CM | POA: Diagnosis not present

## 2019-12-13 DIAGNOSIS — J9611 Chronic respiratory failure with hypoxia: Secondary | ICD-10-CM | POA: Diagnosis not present

## 2019-12-13 DIAGNOSIS — T162XXD Foreign body in left ear, subsequent encounter: Secondary | ICD-10-CM | POA: Diagnosis not present

## 2019-12-13 DIAGNOSIS — I2510 Atherosclerotic heart disease of native coronary artery without angina pectoris: Secondary | ICD-10-CM | POA: Diagnosis not present

## 2019-12-15 ENCOUNTER — Other Ambulatory Visit: Payer: Self-pay

## 2019-12-15 ENCOUNTER — Ambulatory Visit (INDEPENDENT_AMBULATORY_CARE_PROVIDER_SITE_OTHER): Payer: Medicare Other

## 2019-12-15 DIAGNOSIS — I5022 Chronic systolic (congestive) heart failure: Secondary | ICD-10-CM | POA: Diagnosis not present

## 2019-12-15 DIAGNOSIS — I420 Dilated cardiomyopathy: Secondary | ICD-10-CM

## 2019-12-15 DIAGNOSIS — R0600 Dyspnea, unspecified: Secondary | ICD-10-CM | POA: Diagnosis not present

## 2019-12-15 NOTE — Progress Notes (Signed)
Complete echocardiogram has been performed.  Jimmy Karesa Maultsby RDCS, RVT 

## 2019-12-16 ENCOUNTER — Ambulatory Visit: Payer: Medicare Other | Admitting: Legal Medicine

## 2019-12-17 ENCOUNTER — Other Ambulatory Visit: Payer: Self-pay | Admitting: Neurology

## 2019-12-17 ENCOUNTER — Telehealth: Payer: Self-pay | Admitting: Neurology

## 2019-12-17 ENCOUNTER — Telehealth: Payer: Self-pay | Admitting: Legal Medicine

## 2019-12-17 MED ORDER — UBRELVY 100 MG PO TABS: 1.0000 | ORAL_TABLET | ORAL | 6 refills | Status: DC | PRN

## 2019-12-17 NOTE — Telephone Encounter (Signed)
Prescription sent

## 2019-12-17 NOTE — Progress Notes (Signed)
  Chronic Care Management   Note  12/17/2019 Name: AUBREY BLACKARD MRN: [id number] DOB: 1956/02/18  PEARLENE TEAT is a 64 y.o. year old female who is a primary care patient of Lillard Anes, MD. I reached out to Joslyn Devon by phone today in response to a referral sent by Ms. Colvin Caroli Zinda's PCP, Lillard Anes, MD.   Ms. Allor was given information about Chronic Care Management services today including:  1. CCM service includes personalized support from designated clinical staff supervised by her physician, including individualized plan of care and coordination with other care providers 2. 24/7 contact phone numbers for assistance for urgent and routine care needs. 3. Service will only be billed when office clinical staff spend 20 minutes or more in a month to coordinate care. 4. Only one practitioner may furnish and bill the service in a calendar month. 5. The patient may stop CCM services at any time (effective at the end of the month) by phone call to the office staff.   Patient agreed to services and verbal consent obtained.  This note is not being shared with the patient for the following reason: To respect privacy (The patient or proxy has requested that the information not be shared). Follow up plan:   Earney Hamburg Upstream Scheduler

## 2019-12-17 NOTE — Telephone Encounter (Signed)
Patient needs to have someone call her when we call in the refill for the ubrelvy. She has to let them know which insurance to file it with   [facility] in [place]

## 2019-12-17 NOTE — Telephone Encounter (Signed)
Spoke with patient who is requesting we send her [name] to [facility] on [address] in  [place].   Is this ok to refill?

## 2019-12-20 ENCOUNTER — Other Ambulatory Visit: Payer: Self-pay

## 2019-12-20 ENCOUNTER — Ambulatory Visit (INDEPENDENT_AMBULATORY_CARE_PROVIDER_SITE_OTHER): Payer: Medicare Other | Admitting: Legal Medicine

## 2019-12-20 ENCOUNTER — Encounter: Payer: Self-pay | Admitting: Legal Medicine

## 2019-12-20 VITALS — BP 100/60 | HR 70 | Temp 97.7°F | Resp 17 | Ht 58.66 in | Wt 142.4 lb

## 2019-12-20 DIAGNOSIS — J449 Chronic obstructive pulmonary disease, unspecified: Secondary | ICD-10-CM

## 2019-12-20 DIAGNOSIS — I10 Essential (primary) hypertension: Secondary | ICD-10-CM

## 2019-12-20 DIAGNOSIS — I5022 Chronic systolic (congestive) heart failure: Secondary | ICD-10-CM | POA: Diagnosis not present

## 2019-12-20 DIAGNOSIS — Z23 Encounter for immunization: Secondary | ICD-10-CM

## 2019-12-20 DIAGNOSIS — E785 Hyperlipidemia, unspecified: Secondary | ICD-10-CM

## 2019-12-20 NOTE — Assessment & Plan Note (Signed)
An individual hypertension care plan was established and reinforced today.  The patient's status was assessed using clinical findings on exam and labs or diagnostic tests. The patient's success at meeting treatment goals on disease specific evidence-based guidelines and found to be well controlled. SELF MANAGEMENT: The patient and I together assessed ways to personally work towards obtaining the recommended goals. RECOMMENDATIONS: avoid decongestants found in common cold remedies, decrease consumption of alcohol, perform routine monitoring of BP with home BP cuff, exercise, reduction of dietary salt, take medicines as prescribed, try not to miss doses and quit smoking.  Regular exercise and maintaining a healthy weight is needed.  Stress reduction may help. A CLINICAL SUMMARY including written plan identify barriers to care unique to individual due to social or financial issues.  We attempt to mutually creat solutions for individual and family understanding. 

## 2019-12-20 NOTE — Assessment & Plan Note (Signed)
>>  ASSESSMENT AND PLAN FOR COPD (CHRONIC OBSTRUCTIVE PULMONARY DISEASE) ([place]) WRITTEN ON 12/20/2019  6:57 PM BY PERRY, Zeb Comfort, MD  An individualize plan was formulated for care of COPD.  Treatment is evidence based.  She will continue on inhalers, avoid smoking and smoke.  Regular exercise with help with dyspnea. Routine follow ups and medication compliance is needed.

## 2019-12-20 NOTE — Assessment & Plan Note (Signed)
AN INDIVIDUAL CARE PLAN for hyperlipidemia/ cholesterol was established and reinforced today.  The patient's status was assessed using clinical findings on exam, lab and other diagnostic tests. The patient's disease status was assessed based on evidence-based guidelines and found to be well controlled. MEDICATIONS were reviewed. SELF MANAGEMENT GOALS have been discussed and patient's success at attaining the goal of low cholesterol was assessed. RECOMMENDATION given include regular exercise 3 days a week and low cholesterol/low fat diet. CLINICAL SUMMARY including written plan to identify barriers unique to the patient due to social or economic  reasons was discussed. 

## 2019-12-20 NOTE — Assessment & Plan Note (Signed)
An individualized care plan was established and reinforced.  The patient's disease status was assessed using clinical finding son exam today, labs, and/or other diagnostic testing such as x-rays, to determine the patient's success in meeting treatmentgoalsbased on disease-based guidelines and found to beimproving. But not at goal yet. Medications prescriptions no change Laboratory tests ordered to be performed today include routine. RECOMMENDATIONS: given include see cardiology.  Call physician is patient gains 3 lbs in one day or 5 lbs for one week.  Call for progressive PND, orthopnea or increased pedal edema.

## 2019-12-20 NOTE — Progress Notes (Signed)
Established Patient Office Visit  Subjective:  Patient ID: [name], female    DOB: Feb 21, 1956  Age: 64 y.o. MRN: [id number]  CC:  Chief Complaint  Patient presents with  . Congestive Heart Failure  . Depression  . COPD  . Hyperlipidemia    HPI [name] presents for chronic visit.  Patient presents with combination systolic and diastolic fairure  that is stable. Diagnosis made 2015.  The course of the disease is stable.  Current medicines include entresto, carvedilol. Patient follows a low cholesterol diet and maintains a weight diary.  Patient is on low salt, low cholesterol diet and avoids alcohol.  Patient denies adverse effects of medicines. Patient is monitoring weight and has gained 20bs of weight.  Patient is having some pedal edema, no PND and no PND.  Patient is continuing to see cardiology. EF 35- 40%  This patient has major depression for years.  PHQ9 =8.  Patient is having less anhedonia.  The patient has improved future plans and prospects.  The depression is worse with stress and pain.  The patient is exercising and working on behavior to improve mental health.  Patient is not seeing a therapist or psychiatrist.  na  Patient is on diazepam.  Patient presents with diagnosis of COPD.  It is not secondary to prolonged asthma.  Diagnosis 10  Treatment includes combivent.  The diagnosis has been hospitalized for this diagnosis. Last unknown.  Patient is compliant with regular use of medicines.  Patient presents with hyperlipidemia.  Compliance with treatment has been good; patient takes medicines as directed, maintains low cholesterol diet, follows up as directed, and maintains exercise regimen.  Patient is using crestor without problems.  Past Medical History:  Diagnosis Date  . Admission for long-term opiate analgesic use 10/24/2019  . AICD (automatic cardioverter/defibrillator) present 2012   . Arthritis   . Arthritis   . Arthritis of right hip 05/29/2016   Formatting of this note might be different from the original. Added automatically from request for surgery [phone]  . Asthma   . Cardiomyopathy [facility])    Overview:  Ejection fraction 45% in 2015 Ejection fraction 30 to 35% in November 2018  . CHF (congestive heart failure) ([place])   . Chronic back pain   . Chronic hypoxemic respiratory failure ([place]) 10/24/2019  . Chronic narcotic use 03/23/2015  . Chronic pain of both knees 12/21/2018   Added automatically from request for surgery [id number]  Formatting of this note might be different from the original. Added automatically from request for surgery [phone]  . Chronic systolic congestive heart failure, NYHA class 2 ([place]) 06/19/2017  . COPD (chronic obstructive pulmonary disease) ([place])   . Coronary artery disease involving native coronary artery of native heart without angina pectoris 05/31/2015   Overview:  Abnormal stress test in fall of 2016, cardiac catheterization showed normal coronaries.  . Dilated cardiomyopathy ([place]) 06/19/2017  . 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 Dyslipidemia 05/31/2015  . Essential hypertension 05/31/2015  . GERD (gastroesophageal reflux disease)   . Heart failure   . HLD (hyperlipidemia)   . Hypertension   . ICD (implantable cardiac defibrillator) in place 2012  . Ileus following gastrointestinal surgery ([place]) 03/26/2015  . Left ventricular systolic dysfunction   . Major depressive disorder, single episode, moderate ([place]) 10/24/2019  . Migraine without aura with status migrainosus 10/24/2019  . Myocardial infarction [facility])    2011 during intestinal blockage surgery  .  Nausea alone 04/19/2014  . NICM (nonischemic cardiomyopathy) ([place])   . Obstructive chronic bronchitis with exacerbation ([place]) 10/25/2019  . Other spondylosis with radiculopathy, lumbar region 10/24/2019  . Presence of left artificial hip joint 10/24/2019  . PTSD (post-traumatic stress  disorder)   . PVCs (premature ventricular contractions)   . 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 Senile osteoporosis 10/24/2019  . Thyroid disease     Past Surgical History:  Procedure Laterality Date  . APPENDECTOMY    . CARDIAC CATHETERIZATION    . CARPAL TUNNEL RELEASE    . CESAREAN SECTION    . CHOLECYSTECTOMY    . CORONARY ANGIOPLASTY    . HAND SURGERY    . ICD GENERATOR CHANGEOUT N/A 05/21/2019   Procedure: ICD GENERATOR CHANGEOUT;  Surgeon: Constance Haw, MD;  Location: [place] [facility];  Service: Cardiovascular;  Laterality: N/A;  . ICD IMPLANT     [employer]  . intestinal blockage 2011    . LAPAROSCOPIC ASSISTED VENTRAL HERNIA REPAIR N/A 03/23/2015   Procedure: LAPAROSCOPIC VENTRAL WALL HERNIA REPAIR;  Surgeon: Michael Boston, MD;  Location: [facility];  Service: General;  Laterality: N/A;  With MESH  . LAPAROSCOPIC LYSIS OF ADHESIONS N/A 03/23/2015   Procedure: LAPAROSCOPIC LYSIS OF ADHESIONS;  Surgeon: Michael Boston, MD;  Location: [facility] OR[facility];  Service: General;  Laterality: N/A;  . NECK SURGERY     fused  . TONSILLECTOMY    . ULNAR NERVE TRANSPOSITION  01/23/2012   Procedure: ULNAR NERVE DECOMPRESSION/TRANSPOSITION;  Surgeon: Ophelia Charter, MD;  Location: [place] [facility] OR[facility];  Service: Neurosurgery;  Laterality: Left;  LEFT ulnar nerve decompression    Family History  Problem Relation Age of Onset  . Heart attack Other   . Cancer Other   . Heart failure Other   . Hypertension Mother   . Heart attack Mother   . Alcohol abuse Father   . Hypertension Father   . Heart attack Father   . Anesthesia problems Neg Hx   . Hypotension Neg Hx   . Malignant hyperthermia Neg Hx   . Pseudochol deficiency Neg Hx     Social History   Socioeconomic History  . Marital status: Single    Spouse name: Not on file  . Number of children: 3  . Years of education: Not on file  . Highest education level: Not on file  Occupational  History  . Occupation: disabled  Tobacco Use  . Smoking status: Current Every Day Smoker    Packs/day: 0.50    Years: 30.00    Pack years: 15.00    Types: Cigarettes    Last attempt to quit: 08/11/2010    Years since quitting: 9.3  . Smokeless tobacco: Never Used  Substance and Sexual Activity  . Alcohol use: Yes    Alcohol/week: 2.0 standard drinks    Types: 1 Cans of beer, 1 Standard drinks or equivalent per week    Comment: seldom  . Drug use: No  . Sexual activity: Not Currently  Other Topics Concern  . Not on file  Social History Narrative   One level home with boyfriend   Caffeine - coffee 1-2 cups/day; Green tea 4-5 bottles a day   Exercise - some    Right handed      Social Determinants of Health   Financial Resource Strain:   . Difficulty of Paying Living Expenses:   Food Insecurity:   . Worried About Estate manager/land agent  of Food in the Last Year:   . [place] in the Last Year:   Transportation Needs:   . Lack of Transportation (Medical):   Marland Kitchen Lack of Transportation (Non-Medical):   Physical Activity:   . Days of Exercise per Week:   . Minutes of Exercise per Session:   Stress:   . Feeling of Stress :   Social Connections:   . Frequency of Communication with Friends and Family:   . Frequency of Social Gatherings with Friends and Family:   . Attends Religious Services:   . Active Member of Clubs or Organizations:   . Attends Archivist Meetings:   Marland Kitchen Marital Status:   Intimate Partner Violence:   . Fear of Current or Ex-Partner:   . Emotionally Abused:   Marland Kitchen Physically Abused:   . Sexually Abused:     Outpatient Medications Prior to Visit  Medication Sig Dispense Refill  . albuterol-ipratropium (COMBIVENT) 18-103 MCG/ACT inhaler Inhale 2 puffs into the lungs every 6 (six) hours as needed for wheezing or shortness of breath.     Marland Kitchen alendronate (FOSAMAX) 70 MG tablet Take 70 mg by mouth every Monday. Take with a full glass of water on an empty  stomach.    Marland Kitchen amoxicillin-clavulanate (AUGMENTIN) 500-125 MG tablet Take 1 tablet by mouth 2 (two) times daily.    Marland Kitchen aspirin 81 MG tablet Take 81 mg by mouth daily.      . Buprenorphine HCl (BELBUCA) 900 MCG FILM Place 900 mcg inside cheek every 12 (twelve) hours.    . carvedilol (COREG) 3.125 MG tablet TAKE 1 TABLET BY MOUTH TWICE DAILY 180 tablet 2  . celecoxib (CELEBREX) 200 MG capsule Take 200 mg by mouth daily.   3  . diazepam (VALIUM) 5 MG tablet Take 5 mg by mouth 3 (three) times daily as needed.    Marland Kitchen ENTRESTO 24-26 MG TAKE 1 TABLET BY MOUTH TWICE DAILY 180 tablet 1  . esomeprazole (NEXIUM) 20 MG capsule Take 20 mg by mouth daily.     . fluticasone (FLONASE) 50 MCG/ACT nasal spray Place 2 sprays into both nostrils daily as needed (congestion).     . fluticasone furoate-vilanterol (BREO ELLIPTA) 100-25 MCG/INH AEPB Inhale 1 puff into the lungs daily as needed (shortness).     . furosemide (LASIX) 40 MG tablet Take 1 tablet daily. If you gain 3 pounds in 1 day or 5 pounds in 2 days, take 0.5 tablet (20 mg) in the afternoon. 30 tablet   . gabapentin (NEURONTIN) 800 MG tablet Take 800 mg by mouth 3 (three) times daily. Patient usually takes twice a day  2  . ipratropium (ATROVENT) 0.02 % nebulizer solution Take 0.5 mg by nebulization every 4 (four) hours as needed for wheezing or shortness of breath.    . levocetirizine (XYZAL) 5 MG tablet Take 5 mg by mouth daily as needed.    Marland Kitchen LINZESS 145 MCG CAPS capsule TAKE 1 CAPSULE BY MOUTH EVERY DAY 90 capsule 2  . nitroGLYCERIN (NITROSTAT) 0.4 MG SL tablet Place 1 tablet (0.4 mg total) under the tongue every 5 (five) minutes as needed for chest pain. 25 tablet 6  . Oxycodone HCl 10 MG TABS Take 1 tablet by mouth every 4 (four) hours as needed.    . ranolazine (RANEXA) 500 MG 12 hr tablet TAKE 1 TABLET(500 MG) BY MOUTH TWICE DAILY 180 tablet 2  . rosuvastatin (CRESTOR) 20 MG tablet Take 1 tablet (20 mg total) by  mouth daily. 90 tablet 1  . sertraline  (ZOLOFT) 50 MG tablet Take 50 mg by mouth daily.    Marland Kitchen topiramate (TOPAMAX) 50 MG tablet Take 2 tablets (100 mg total) by mouth at bedtime. 60 tablet 5  . Ubrogepant (UBRELVY) 100 MG TABS Take 1 tablet by mouth as needed (May repeat 1 tablet after 2 hours if needed.  Maximum 2 tablets in 24 hours). 16 tablet 6  . levofloxacin (LEVAQUIN) 500 MG tablet Take 1 tablet (500 mg total) by mouth daily. 7 tablet 0  . oxyCODONE-acetaminophen (PERCOCET) 10-325 MG tablet Take 1 tablet by mouth 4 (four) times daily as needed.    . topiramate (TOPAMAX) 25 MG tablet Take 75 mg by mouth at bedtime.     No facility-administered medications prior to visit.    No Known Allergies  ROS Review of Systems  Constitutional: Positive for fatigue.  HENT: Negative.   Eyes: Negative.   Respiratory: Negative.   Cardiovascular: Negative.   Gastrointestinal: Negative.   Endocrine: Negative.   Genitourinary: Negative.   Musculoskeletal: Positive for arthralgias.  Neurological: Negative.   Psychiatric/Behavioral: Positive for dysphoric mood.      Objective:    Physical Exam  Constitutional: She is oriented to person, place, and time. She appears well-developed and well-nourished.  HENT:  Head: Normocephalic and atraumatic.  Right Ear: External ear normal.  Left Ear: External ear normal.  Nose: Nose normal.  Mouth/Throat: Oropharynx is clear and moist.  Eyes: Pupils are equal, round, and reactive to light. Conjunctivae and EOM are normal.  Cardiovascular: Normal rate, regular rhythm, normal heart sounds and intact distal pulses.  Pulmonary/Chest: Effort normal and breath sounds normal.  Abdominal: Soft. Bowel sounds are normal.  Musculoskeletal:        General: Normal range of motion.     Cervical back: Normal range of motion and neck supple.  Neurological: She is alert and oriented to person, place, and time. She has normal reflexes.  Skin: Skin is warm and dry.  Psychiatric:  depression  Vitals  reviewed.   BP 100/60 (BP Location: Right Arm, Patient Position: Sitting)   Pulse 70   Temp 97.7 F (36.5 C) (Temporal)   Resp 17   Ht 4' 10.66" (1.49 m)   Wt 142 lb 6.4 oz (64.6 kg)   BMI 29.09 kg/m  Wt Readings from Last 3 Encounters:  12/20/19 142 lb 6.4 oz (64.6 kg)  12/02/19 144 lb 3.2 oz (65.4 kg)  10/25/19 130 lb (59 kg)     Health Maintenance Due  Topic Date Due  . Hepatitis C Screening  Never done  . HIV Screening  Never done  . COVID-19 Vaccine (1) Never done  . TETANUS/TDAP  Never done  . PAP SMEAR-Modifier  Never done  . MAMMOGRAM  Never done  . COLONOSCOPY  Never done  . DEXA SCAN  Never done    There are no preventive care reminders to display for this patient.  Lab Results  Component Value Date   TSH 1.530 07/20/2019   Lab Results  Component Value Date   WBC 8.6 04/29/2019   HGB 10.8 (L) 04/29/2019   HCT 32.9 (L) 04/29/2019   MCV 93 04/29/2019   PLT 307 04/29/2019   Lab Results  Component Value Date   NA 142 12/02/2019   K 4.1 12/02/2019   CO2 26 12/02/2019   GLUCOSE 78 12/02/2019   BUN 14 12/02/2019   CREATININE 1.05 (H) 12/02/2019   BILITOT 0.6 03/26/2015  ALKPHOS 62 03/26/2015   AST 17 03/26/2015   ALT 14 03/26/2015   PROT 6.7 03/26/2015   ALBUMIN 3.3 (L) 03/26/2015   CALCIUM 9.8 12/02/2019   ANIONGAP 7 03/27/2015   Lab Results  Component Value Date   CHOL 170 06/30/2017   Lab Results  Component Value Date   HDL 64 06/30/2017   Lab Results  Component Value Date   LDLCALC 74 06/30/2017   Lab Results  Component Value Date   TRIG 160 (H) 06/30/2017   Lab Results  Component Value Date   CHOLHDL 2.7 06/30/2017   No results found for: HGBA1C    Assessment & Plan:   Problem List Items Addressed This Visit      Cardiovascular and Mediastinum   Chronic systolic congestive heart failure, NYHA class 2 ([place])    An individualized care plan was established and reinforced.  The patient's disease status was assessed using  clinical finding son exam today, labs, and/or other diagnostic testing such as x-rays, to determine the patient's success in meeting treatmentgoalsbased on disease-based guidelines and found to beimproving. But not at goal yet. Medications prescriptions no change Laboratory tests ordered to be performed today include routine. RECOMMENDATIONS: given include see cardiology.  Call physician is patient gains 3 lbs in one day or 5 lbs for one week.  Call for progressive PND, orthopnea or increased pedal edema.      Essential hypertension    An individual hypertension care plan was established and reinforced today.  The patient's status was assessed using clinical findings on exam and labs or diagnostic tests. The patient's success at meeting treatment goals on disease specific evidence-based guidelines and found to be well controlled. SELF MANAGEMENT: The patient and I together assessed ways to personally work towards obtaining the recommended goals. RECOMMENDATIONS: avoid decongestants found in common cold remedies, decrease consumption of alcohol, perform routine monitoring of BP with home BP cuff, exercise, reduction of dietary salt, take medicines as prescribed, try not to miss doses and quit smoking.  Regular exercise and maintaining a healthy weight is needed.  Stress reduction may help. A CLINICAL SUMMARY including written plan identify barriers to care unique to individual due to social or financial issues.  We attempt to mutually creat solutions for individual and family understanding.      Relevant Orders   CBC with Differential/Platelet   Comprehensive metabolic panel     Respiratory   COPD (chronic obstructive pulmonary disease) ([place]) - Primary    An individualize plan was formulated for care of COPD.  Treatment is evidence based.  She will continue on inhalers, avoid smoking and smoke.  Regular exercise with help with dyspnea. Routine follow ups and medication compliance is needed.       Relevant Medications   levocetirizine (XYZAL) 5 MG tablet     Other   Dyslipidemia    AN INDIVIDUAL CARE PLAN for hyperlipidemia/ cholesterol was established and reinforced today.  The patient's status was assessed using clinical findings on exam, lab and other diagnostic tests. The patient's disease status was assessed based on evidence-based guidelines and found to be well controlled. MEDICATIONS were reviewed. SELF MANAGEMENT GOALS have been discussed and patient's success at attaining the goal of low cholesterol was assessed. RECOMMENDATION given include regular exercise 3 days a week and low cholesterol/low fat diet. CLINICAL SUMMARY including written plan to identify barriers unique to the patient due to social or economic  reasons was discussed.      Relevant Orders  Lipid panel      No orders of the defined types were placed in this encounter.   Follow-up: Return in about 3 months (around 03/20/2020).    Reinaldo Meeker, MD

## 2019-12-20 NOTE — Assessment & Plan Note (Signed)
An individualize plan was formulated for care of COPD.  Treatment is evidence based.  She will continue on inhalers, avoid smoking and smoke.  Regular exercise with help with dyspnea. Routine follow ups and medication compliance is needed. 

## 2019-12-20 NOTE — Assessment & Plan Note (Signed)
>>  ASSESSMENT AND PLAN FOR DYSLIPIDEMIA WRITTEN ON 12/20/2019  6:58 PM BY PERRY, Mickle Mallory, MD  AN INDIVIDUAL CARE PLAN for hyperlipidemia/ cholesterol was established and reinforced today.  The patient's status was assessed using clinical findings on exam, lab and other diagnostic tests. The patient's disease status was assessed based on evidence-based guidelines and found to be well controlled. MEDICATIONS were reviewed. SELF MANAGEMENT GOALS have been discussed and patient's success at attaining the goal of low cholesterol was assessed. RECOMMENDATION given include regular exercise 3 days a week and low cholesterol/low fat diet. CLINICAL SUMMARY including written plan to identify barriers unique to the patient due to social or economic  reasons was discussed.

## 2019-12-21 LAB — CBC WITH DIFFERENTIAL/PLATELET
Basophils Absolute: 0.1 10*3/uL (ref 0.0–0.2)
Basos: 1 %
EOS (ABSOLUTE): 0.2 10*3/uL (ref 0.0–0.4)
Eos: 2 %
Hematocrit: 36.1 % (ref 34.0–46.6)
Hemoglobin: 11.7 g/dL (ref 11.1–15.9)
Immature Grans (Abs): 0 10*3/uL (ref 0.0–0.1)
Immature Granulocytes: 0 %
Lymphocytes Absolute: 2.4 10*3/uL (ref 0.7–3.1)
Lymphs: 26 %
MCH: 30.2 pg (ref 26.6–33.0)
MCHC: 32.4 g/dL (ref 31.5–35.7)
MCV: 93 fL (ref 79–97)
Monocytes Absolute: 0.8 10*3/uL (ref 0.1–0.9)
Monocytes: 8 %
Neutrophils Absolute: 5.9 10*3/uL (ref 1.4–7.0)
Neutrophils: 63 %
Platelets: 352 10*3/uL (ref 150–450)
RBC: 3.88 x10E6/uL (ref 3.77–5.28)
RDW: 12 % (ref 11.7–15.4)
WBC: 9.3 10*3/uL (ref 3.4–10.8)

## 2019-12-21 LAB — LIPID PANEL
Chol/HDL Ratio: 2.7 ratio (ref 0.0–4.4)
Cholesterol, Total: 164 mg/dL (ref 100–199)
HDL: 61 mg/dL (ref >39)
LDL Chol Calc (NIH): 83 mg/dL (ref 0–99)
Triglycerides: 110 mg/dL (ref 0–149)
VLDL Cholesterol Cal: 20 mg/dL (ref 5–40)

## 2019-12-21 LAB — COMPREHENSIVE METABOLIC PANEL
ALT: 6 IU/L (ref 0–32)
AST: 17 IU/L (ref 0–40)
Albumin/Globulin Ratio: 2 (ref 1.2–2.2)
Albumin: 4.2 g/dL (ref 3.8–4.8)
Alkaline Phosphatase: 52 IU/L (ref 39–117)
BUN/Creatinine Ratio: 16 (ref 12–28)
BUN: 18 mg/dL (ref 8–27)
Bilirubin Total: 0.2 mg/dL (ref 0.0–1.2)
CO2: 25 mmol/L (ref 20–29)
Calcium: 10 mg/dL (ref 8.7–10.3)
Chloride: 104 mmol/L (ref 96–106)
Creatinine, Ser: 1.13 mg/dL — ABNORMAL HIGH (ref 0.57–1.00)
GFR calc Af Amer: 59 mL/min/{1.73_m2} — ABNORMAL LOW (ref >59)
GFR calc non Af Amer: 51 mL/min/{1.73_m2} — ABNORMAL LOW (ref >59)
Globulin, Total: 2.1 g/dL (ref 1.5–4.5)
Glucose: 91 mg/dL (ref 65–99)
Potassium: 3.9 mmol/L (ref 3.5–5.2)
Sodium: 143 mmol/L (ref 134–144)
Total Protein: 6.3 g/dL (ref 6.0–8.5)

## 2019-12-21 LAB — CARDIOVASCULAR RISK ASSESSMENT

## 2019-12-21 NOTE — Progress Notes (Signed)
New anemia,any blood loss, kidney functions stable, liver tests normal, cholesterol normal lp

## 2019-12-21 NOTE — Progress Notes (Signed)
  Chronic Care Management   Note  12/21/2019 Name: [name] MRN: [id number] DOB: October 30, 1955  [name] is a 64 y.o. year old female who is a primary care patient of Lillard Anes, MD. I reached out to Joslyn Devon by phone today in response to a referral sent by Ms. Colvin Caroli Arvie's PCP, Lillard Anes, MD.   [name] was given information about Chronic Care Management services today including:  1. CCM service includes personalized support from designated clinical staff supervised by her physician, including individualized plan of care and coordination with other care providers 2. 24/7 contact phone numbers for assistance for urgent and routine care needs. 3. Service will only be billed when office clinical staff spend 20 minutes or more in a month to coordinate care. 4. Only one practitioner may furnish and bill the service in a calendar month. 5. The patient may stop CCM services at any time (effective at the end of the month) by phone call to the office staff.   Patient agreed to services and verbal consent obtained.   This note is not being shared with the patient for the following reason: To respect privacy (The patient or proxy has requested that the information not be shared).  Follow up plan:   SIGNATURE

## 2019-12-24 ENCOUNTER — Telehealth: Payer: Self-pay | Admitting: Cardiology

## 2019-12-24 NOTE — Telephone Encounter (Signed)
Patient informed of results.  

## 2019-12-24 NOTE — Telephone Encounter (Signed)
   Pt is returning call from [facility] regarding her echo results  Please call

## 2019-12-29 DIAGNOSIS — H02401 Unspecified ptosis of right eyelid: Secondary | ICD-10-CM | POA: Diagnosis not present

## 2019-12-29 DIAGNOSIS — H5340 Unspecified visual field defects: Secondary | ICD-10-CM | POA: Diagnosis not present

## 2019-12-31 DIAGNOSIS — G8929 Other chronic pain: Secondary | ICD-10-CM | POA: Diagnosis not present

## 2019-12-31 DIAGNOSIS — Z79899 Other long term (current) drug therapy: Secondary | ICD-10-CM | POA: Diagnosis not present

## 2019-12-31 DIAGNOSIS — M5136 Other intervertebral disc degeneration, lumbar region: Secondary | ICD-10-CM | POA: Diagnosis not present

## 2019-12-31 DIAGNOSIS — M419 Scoliosis, unspecified: Secondary | ICD-10-CM | POA: Diagnosis not present

## 2019-12-31 DIAGNOSIS — M545 Low back pain: Secondary | ICD-10-CM | POA: Diagnosis not present

## 2020-01-04 DIAGNOSIS — M7918 Myalgia, other site: Secondary | ICD-10-CM | POA: Diagnosis not present

## 2020-01-04 DIAGNOSIS — G894 Chronic pain syndrome: Secondary | ICD-10-CM | POA: Diagnosis not present

## 2020-01-06 DIAGNOSIS — J441 Chronic obstructive pulmonary disease with (acute) exacerbation: Secondary | ICD-10-CM | POA: Diagnosis not present

## 2020-01-10 DIAGNOSIS — J449 Chronic obstructive pulmonary disease, unspecified: Secondary | ICD-10-CM | POA: Diagnosis not present

## 2020-01-11 DIAGNOSIS — I2510 Atherosclerotic heart disease of native coronary artery without angina pectoris: Secondary | ICD-10-CM | POA: Diagnosis not present

## 2020-01-11 DIAGNOSIS — J9611 Chronic respiratory failure with hypoxia: Secondary | ICD-10-CM | POA: Diagnosis not present

## 2020-01-11 DIAGNOSIS — I5022 Chronic systolic (congestive) heart failure: Secondary | ICD-10-CM | POA: Diagnosis not present

## 2020-01-12 ENCOUNTER — Other Ambulatory Visit: Payer: Self-pay | Admitting: Cardiology

## 2020-01-17 NOTE — Chronic Care Management (AMB) (Signed)
Chronic Care Management Pharmacy  Name: [name]  MRN: [id number] DOB: 1956/06/04  Chief Complaint/ HPI  [name],  64 y.o. , female presents for their Initial CCM visit with the clinical pharmacist via telephone due to COVID-19 Pandemic.  PCP : Lillard Anes, MD  Their chronic conditions include: HTN, COPD, Cardiomyopathy, GERD, Osteoporosis, Chronic back pain, HLD.   Office Visits: 12/20/2019 - no new medications ordered.  10/25/2019 - Levaquin and prednisone given for COPD exacerbation post-COVID.  Consult Visit: 01/04/2020 - Chronic pain - trigger point injections ordered. Topiramate and tizanidine.  12/02/2019 - Cardiology visit. Continue beta blocker. Chem7 panel evaluated to see if higher dose of diuretic possible. Patient counseled on limiting fluid intake to avoid additional swelling.  10/19/2019 - change sumatriptan to Ubrelvy. Topiramate increased. 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  10/07/2019 - patient thinks flexeril is helping with muscle pain. 09/21/2019 - Infusion therapy ordered for COVID.  09/13/2019 - Cardiology electrophysiology. No med changes.  09/10/2019 - Arvind - pain management 09/07/2019 - Pain management  09/02/2019 - Cardiology remote device check. 08/10/2019 - pain management.  07/28/2019 - nerve pain.    Medications: Outpatient Encounter Medications as of 01/18/2020  Medication Sig  . aspirin 81 MG tablet Take 81 mg by mouth daily.    . Buprenorphine HCl (BELBUCA) 900 MCG FILM Place 900 mcg inside cheek every 12 (twelve) hours.  . cyclobenzaprine (FLEXERIL) 10 MG tablet Take 10 mg by mouth 3 (three) times daily as needed for muscle spasms.  . diazepam (VALIUM) 5 MG tablet Take 5 mg by mouth 3 (three) times daily as needed.  Marland Kitchen levocetirizine (XYZAL) 5 MG tablet Take 5 mg by mouth daily as needed.  . Multiple Vitamins-Minerals (AIRBORNE GUMMIES PO) Take 3 tablets by mouth daily in the afternoon.  . Oxycodone HCl  10 MG TABS Take 1 tablet by mouth every 4 (four) hours as needed.  Marland Kitchen tiZANidine (ZANAFLEX) 4 MG tablet Take 4 mg by mouth at bedtime.  . [DISCONTINUED] albuterol-ipratropium (COMBIVENT) 18-103 MCG/ACT inhaler Inhale 2 puffs into the lungs every 6 (six) hours as needed for wheezing or shortness of breath.   . [DISCONTINUED] alendronate (FOSAMAX) 70 MG tablet Take 70 mg by mouth every Monday. Take with a full glass of water on an empty stomach.  . [DISCONTINUED] carvedilol (COREG) 3.125 MG tablet TAKE 1 TABLET BY MOUTH TWICE DAILY  . [DISCONTINUED] celecoxib (CELEBREX) 200 MG capsule Take 200 mg by mouth in the morning.   . [DISCONTINUED] ENTRESTO 24-26 MG TAKE 1 TABLET BY MOUTH TWICE DAILY  . [DISCONTINUED] EPINEPHrine 0.3 mg/0.3 mL IJ SOAJ injection Inject 0.3 mg into the muscle as needed for anaphylaxis.  . [DISCONTINUED] esomeprazole (NEXIUM) 20 MG capsule Take 20 mg by mouth in the morning.   . [DISCONTINUED] fluticasone (FLONASE) 50 MCG/ACT nasal spray Place 2 sprays into both nostrils daily as needed (congestion).   . [DISCONTINUED] fluticasone furoate-vilanterol (BREO ELLIPTA) 100-25 MCG/INH AEPB Inhale 1 puff into the lungs daily as needed (shortness).   . [DISCONTINUED] furosemide (LASIX) 40 MG tablet Take 1 tablet daily. If you gain 3 pounds in 1 day or 5 pounds in 2 days, take 0.5 tablet (20 mg) in the afternoon.  . [DISCONTINUED] ipratropium (ATROVENT) 0.02 % nebulizer solution Take 0.5 mg by nebulization every 4 (four) hours as needed for wheezing or shortness of breath.  . [DISCONTINUED] LINZESS 145 MCG CAPS capsule TAKE 1 CAPSULE BY MOUTH EVERY DAY (Patient taking differently:  Take 145 mcg by mouth in the morning. )  . [DISCONTINUED] nitroGLYCERIN (NITROSTAT) 0.4 MG SL tablet Place 1 tablet (0.4 mg total) under the tongue every 5 (five) minutes as needed for chest pain.  . [DISCONTINUED] ranolazine (RANEXA) 500 MG 12 hr tablet TAKE 1 TABLET(500 MG) BY MOUTH TWICE DAILY  . [DISCONTINUED]  rosuvastatin (CRESTOR) 20 MG tablet Take 1 tablet (20 mg total) by mouth daily. (Patient taking differently: Take 20 mg by mouth in the morning. )  . [DISCONTINUED] sertraline (ZOLOFT) 50 MG tablet Take 50 mg by mouth in the morning.   . [DISCONTINUED] topiramate (TOPAMAX) 50 MG tablet Take 2 tablets (100 mg total) by mouth at bedtime.  . [DISCONTINUED] Ubrogepant (UBRELVY) 100 MG TABS Take 1 tablet by mouth as needed (May repeat 1 tablet after 2 hours if needed.  Maximum 2 tablets in 24 hours).  . [DISCONTINUED] amoxicillin-clavulanate (AUGMENTIN) 500-125 MG tablet Take 1 tablet by mouth 2 (two) times daily.  . [DISCONTINUED] gabapentin (NEURONTIN) 800 MG tablet Take 800 mg by mouth 3 (three) times daily. Patient usually takes twice a day   No facility-administered encounter medications on file as of 01/18/2020.   No Known Allergies  SDOH Screenings   Alcohol Screen:   . Last Alcohol Screening Score (AUDIT):   Depression (PHQ2-9): Medium Risk  . PHQ-2 Score: 8  Financial Resource Strain:   . Difficulty of Paying Living Expenses:   Food Insecurity:   . Worried About Charity fundraiser in the Last Year:   . [place] in the Last Year:   Housing:   . Last Housing Risk Score:   Physical Activity:   . Days of Exercise per Week:   . Minutes of Exercise per Session:   Social Connections:   . Frequency of Communication with Friends and Family:   . Frequency of Social Gatherings with Friends and Family:   . Attends Religious Services:   . Active Member of Clubs or Organizations:   . Attends Archivist Meetings:   Marland Kitchen Marital Status:   Stress:   . Feeling of Stress :   Tobacco Use: High Risk  . Smoking Tobacco Use: Current Every Day Smoker  . Smokeless Tobacco Use: Never Used  Transportation Needs:   . Film/video editor (Medical):   Marland Kitchen Lack of Transportation (Non-Medical):      Current Diagnosis/Assessment:  Goals Addressed            This Visit's Progress    . Pharmacy Care Plan       CARE PLAN ENTRY  Current Barriers:  . Chronic Disease Management support, education, and care coordination needs related to Hypertension and Hyperlipidemia   Hypertension . Pharmacist Clinical Goal(s): o Over the next 90 days, patient will work with PharmD and providers to maintain BP goal <130/80 . Current regimen:  o Entresto 24-26 mg bid, furosemide 40 mg daily, carvedilol 3.125 mg bid . Interventions: o Recommend patient continue to limit salt in diet to reduce swelling and keep good blood pressure control.  o Recommend patient continue to limit fluid intake as advised by cardiologist.  . Patient self care activities - Over the next 90 days, patient will: o Check BP monthly, document, and provide at future appointments o Ensure daily salt intake < 2300 mg/day  Hyperlipidemia . Pharmacist Clinical Goal(s): o Over the next 90 days, patient will work with PharmD and providers to maintain LDL goal < 100 . Current regimen:  o  Rosuvastatin 20 mg daily . Interventions: o Recommend patient take rosuvastatin as prescribed.  o Recommend patient continue to incorporate vegetables and lean protein sources in diet.  . Patient self care activities - Over the next 90 days, patient will: o Take medications as prescribed, eat a heart-healthy diet and contact provider with any questions/concerns.   COPD . Pharmacist Clinical Goal(s) o Over the next 90 days, patient will work with PharmD and providers to reduce use of rescue inhaler by using maintenance inhaler daily.  . Current regimen:  o Combivent inhaler 2 puffs q6h prn, Breo Ellipta 100/26 mcg/inh 1 puff in lungs daily prn . Interventions: o Recommend patient begin using Breo Ellpta daily for better symptom management.  . Patient self care activities - Over the next 90 days, patient will: o Use Breo daily to manage symptoms and contact provider as needed.   Medication management . Pharmacist Clinical  Goal(s): o Over the next 90 days, patient will work with PharmD and providers to maintain optimal medication adherence . Current pharmacy: [facility] . Interventions o Comprehensive medication review performed. o Utilize [facility] for medication synchronization, packaging and delivery . Patient self care activities - Over the next 90 days, patient will: o Focus on medication adherence by enrolling in packaging and delivery service.  o Take medications as prescribed o Report any questions or concerns to PharmD and/or provider(s)  Initial goal documentation       Heart Failure/HTN/Cardiomyopathy   Office blood pressures are  BP Readings from Last 3 Encounters:  12/20/19 100/60  12/02/19 112/64  10/25/19 790/24    Type: Systolic  Last ejection fraction: 35-40% NYHA Class: II (slight limitation of activity)  Patient has failed these meds in past: spironolactone Patient is currently uncontrolled on the following medications: Entresto 24-26 mg bid, furosemide 40 daily, nitroglycerin 0.4 mg prn, carvedilol 3.125 mg bid, ranexa 500 mg bid  We discussed diet and exercise extensively Last cardiology visit indicated that heart looked better than previous but not great. Legs are swelling quite a bit. Cardiologist recommended drinking when thirsty but not overdoing it.   Patient reports that she doesn't eat much since she had hernia surgery. Part of her stomach was removed during this procedure. She eats cereal, fish sandwich, pork chops, some vegetables or quesadilla. She doesn't add extra salt to her food.   Has only had to use nitroglycerin a few times.   Plan  Continue current medications    Hyperlipidemia   Lipid Panel     Component Value Date/Time   CHOL 164 12/20/2019 1110   TRIG 110 12/20/2019 1110   HDL 61 12/20/2019 1110   CHOLHDL 2.7 12/20/2019 1110   LDLCALC 83 12/20/2019 1110   LABVLDL 20 12/20/2019 1110     The 10-year ASCVD risk score Mikey Bussing DC Jr.,  et al., 2013) is: 7.3%   Values used to calculate the score:     Age: 56 years     Sex: Female     Is Non-Hispanic African American: No     Diabetic: No     Tobacco smoker: Yes     Systolic Blood Pressure: 097 mmHg     Is BP treated: Yes     HDL Cholesterol: 61 mg/dL     Total Cholesterol: 164 mg/dL   Patient has failed these meds in past: n/a Patient is currently controlled on the following medications: aspirin 81 mg daily, rosuvastatin 20 mg daily  We discussed:  diet and exercise extensively  Plan  Continue current medications   COPD / Asthma / Tobacco   Eosinophil count:   Lab Results  Component Value Date/Time   EOSPCT 1 03/26/2015 12:30 PM  %                               Eos (Absolute):  Lab Results  Component Value Date/Time   EOSABS 0.2 12/20/2019 11:10 AM    Tobacco Status:  Social History   Tobacco Use  Smoking Status Current Every Day Smoker  . Packs/day: 0.50  . Years: 30.00  . Pack years: 15.00  . Types: Cigarettes  . Last attempt to quit: 08/11/2010  . Years since quitting: 9.4  Smokeless Tobacco Never Used    Patient has failed these meds in past: advair  Patient is currently uncontrolled on the following medications: Combivent inhaler 2 puffs q6h prn wheezing/sob, flonase 50 mcg 2 sprays both nostrils daily prn, Breo Ellipta 100-25 mcg/inh 1 puff in lungs daily prn, atrovent neb solution q4h prn wheezing/sob, levocetirizine 5 mg daily prn Using maintenance inhaler regularly? No Frequency of rescue inhaler use:  multiple times per day  We discussed: Patient indicates that heat is making her use her rescue inhaler more lately. Patient has had sinus surgery and tubes put in her ears from ENT. 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 Atrovent comes from mail order pharmacy. Recommend patient use Breo daily as preventative therapy to control symptoms.   Plan  Continue current medications ,    and    Other Diagnosis:GERD    Patient has failed these meds in past: n/a Patient is currently controlled on the following medications: esomeprazole 20 mg daily   We discussed:  diet and exercise extensively. Patient is eating small meals to avoid symptoms of acid reflux.   Plan  Continue current medications   Other Diagnosis:Pain and Migraine    Patient has failed these meds in past: belbuca, fentanyl, gabapentin, hydrocodone-apap, naproxen, imitrex,  Patient is currently controlled on the following medications: Ubrelvy 100 mg daily prn, Celebrex 200 mg daily, Oxycodone 10 mg q4h prn, Belbuca 900 mcg film q12hrs, tizanidine 4 mg qhs  We discussed: Roselyn Meier is taking care of migraines quickly. Having 2-3 episodes each week. Patient has degenerative disk disease and arthritis. Her pain management specialist gives her gabapentin for her nerve pain but she started jerking. Dr. Tomi Likens asked her to stop taking and see if improved. She is having some jerking in hands even without gabapentin for 2 weeks. Recommended patient check with Dr. Tomi Likens or pain management before beginning gabapentin again.   Patient had an injection in knee and back to help with pain but caused lots of nerve spasms. She was given cyclobenzaprine tid for those muscle spasms. If they return she takes them as needed.   Plan  Continue current medications and check with specialists before resuming gabapentin.   Osteopenia / Osteoporosis   Last DEXA Scan: never competed.    No results found for: VD25OH   Patient is taking alendronate but denies a Dexa scan in the past.   Patient has failed these meds in past: n/a Patient is currently controlled on the following medications: alendronate 70 mg weekly  We discussed:  Recommend [phone] units of vitamin D daily. Recommend 1200 mg of calcium daily from dietary and supplemental sources. Counseled on oral bisphosphonate administration: take in the  morning, 30 minutes prior to food  with 6-8 oz of water. Do not lie down for at least 30 minutes after taking. Patient needs a Dexa scan. Recommended patient add caclcium and vitamin D bid.  Plan  Continue current medications and recommend beginning calcium and vitamin D.   Health Maintenance   Patient is currently controlled on the following medications: Airborne gummies - immune system  We discussed:  Patient began taking airborne to rebuild her immune system after COVID.   Plan  Continue current medications  Vaccines   Reviewed and discussed patient's vaccination history. Has not had COVID vaccine and is concerned about getting vaccine.    Immunization History  Administered Date(s) Administered  . Influenza Inj Mdck Quad With Preservative 07/21/2017  . Influenza-Unspecified 06/26/2013, 05/26/2018  . Pneumococcal Polysaccharide-23 12/20/2019    Plan  Recommended patient receive annual flu vaccine in office.   Medication Management   Pt [facility] for all medications Uses pill box? Yes Pt endorses good compliance  We discussed: Patient would benefit from packaging and delivery. She would like to get started with both services.   Plan  Utilize [facility] for medication synchronization, packaging and delivery   Verbal consent obtained for [facility] enhanced pharmacy services (medication synchronization, adherence packaging, delivery coordination). A medication sync plan was created to allow patient to get all medications delivered once every 30 to 90 days per patient preference. Patient understands they have freedom to choose pharmacy and clinical pharmacist will coordinate care between all prescribers and [facility].      Follow up: 1 month phone visit

## 2020-01-17 NOTE — Patient Instructions (Addendum)
Visit Information  Thank you for your time discussing your medications. I look forward to working with you to achieve your health care goals. Below is a summary of what we talked about during our visit.   Goals Addressed   None     [name] was given information about Chronic Care Management services today including:  1. CCM service includes personalized support from designated clinical staff supervised by her physician, including individualized plan of care and coordination with other care providers 2. 24/7 contact phone numbers for assistance for urgent and routine care needs. 3. Standard insurance, coinsurance, copays and deductibles apply for chronic care management only during months in which we provide at least 20 minutes of these services. Most insurances cover these services at 100%, however patients may be responsible for any copay, coinsurance and/or deductible if applicable. This service may help you avoid the need for more expensive face-to-face services. 4. Only one practitioner may furnish and bill the service in a calendar month. 5. The patient may stop CCM services at any time (effective at the end of the month) by phone call to the office staff.  Patient agreed to services and verbal consent obtained.   The patient verbalized understanding of instructions provided today and agreed to receive a mailed copy of patient instruction and/or educational materials. Telephone follow up appointment with pharmacy team member scheduled for: June 2021  Sherre Poot, PharmD Clinical Pharmacist [facility] [phone] (office) [phone] (mobile)  DASH Eating Plan DASH stands for "Dietary Approaches to Stop Hypertension." The DASH eating plan is a healthy eating plan that has been shown to reduce high blood pressure (hypertension). It may also reduce your risk for type 2 diabetes, heart disease, and stroke. The DASH eating plan may also help with weight loss. What are  tips for following this plan?  General guidelines  Avoid eating more than 2,300 mg (milligrams) of salt (sodium) a day. If you have hypertension, you may need to reduce your sodium intake to 1,500 mg a day.  Limit alcohol intake to no more than 1 drink a day for nonpregnant women and 2 drinks a day for men. One drink equals 12 oz of beer, 5 oz of wine, or 1 oz of hard liquor.  Work with your health care provider to maintain a healthy body weight or to lose weight. Ask what an ideal weight is for you.  Get at least 30 minutes of exercise that causes your heart to beat faster (aerobic exercise) most days of the week. Activities may include walking, swimming, or biking.  Work with your health care provider or diet and nutrition specialist (dietitian) to adjust your eating plan to your individual calorie needs. Reading food labels   Check food labels for the amount of sodium per serving. Choose foods with less than 5 percent of the Daily Value of sodium. Generally, foods with less than 300 mg of sodium per serving fit into this eating plan.  To find whole grains, look for the word "whole" as the first word in the ingredient list. Shopping  Buy products labeled as "low-sodium" or "no salt added."  Buy fresh foods. Avoid canned foods and premade or frozen meals. Cooking  Avoid adding salt when cooking. Use salt-free seasonings or herbs instead of table salt or sea salt. Check with your health care provider or pharmacist before using salt substitutes.  Do not fry foods. Cook foods using healthy methods such as baking, boiling, grilling, and broiling instead.  Cook with heart-healthy oils, such as olive, canola, soybean, or sunflower oil. Meal planning  Eat a balanced diet that includes: ? 5 or more servings of fruits and vegetables each day. At each meal, try to fill half of your plate with fruits and vegetables. ? Up to 6-8 servings of whole grains each day. ? Less than 6 oz of lean  meat, poultry, or fish each day. A 3-oz serving of meat is about the same size as a deck of cards. One egg equals 1 oz. ? 2 servings of low-fat dairy each day. ? A serving of nuts, seeds, or beans 5 times each week. ? Heart-healthy fats. Healthy fats called Omega-3 fatty acids are found in foods such as flaxseeds and coldwater fish, like sardines, salmon, and mackerel.  Limit how much you eat of the following: ? Canned or prepackaged foods. ? Food that is high in trans fat, such as fried foods. ? Food that is high in saturated fat, such as fatty meat. ? Sweets, desserts, sugary drinks, and other foods with added sugar. ? Full-fat dairy products.  Do not salt foods before eating.  Try to eat at least 2 vegetarian meals each week.  Eat more home-cooked food and less restaurant, buffet, and fast food.  When eating at a restaurant, ask that your food be prepared with less salt or no salt, if possible. What foods are recommended? The items listed may not be a complete list. Talk with your dietitian about what dietary choices are best for you. Grains Whole-grain or whole-wheat bread. Whole-grain or whole-wheat pasta. Saliah Crisp rice. [place]row. Bulgur. Whole-grain and low-sodium cereals. Pita bread. Low-fat, low-sodium crackers. Whole-wheat flour tortillas. Vegetables Fresh or frozen vegetables (raw, steamed, roasted, or grilled). Low-sodium or reduced-sodium tomato and vegetable juice. Low-sodium or reduced-sodium tomato sauce and tomato paste. Low-sodium or reduced-sodium canned vegetables. Fruits All fresh, dried, or frozen fruit. Canned fruit in natural juice (without added sugar). Meat and other protein foods Skinless chicken or [place]. Ground chicken or [place]. Pork with fat trimmed off. Fish and seafood. Egg whites. Dried beans, peas, or lentils. Unsalted nuts, nut butters, and seeds. Unsalted canned beans. Lean cuts of beef with fat trimmed off. Low-sodium, lean deli  meat. Dairy Low-fat (1%) or fat-free (skim) milk. Fat-free, low-fat, or reduced-fat cheeses. Nonfat, low-sodium ricotta or cottage cheese. Low-fat or nonfat yogurt. Low-fat, low-sodium cheese. Fats and oils Soft margarine without trans fats. Vegetable oil. Low-fat, reduced-fat, or light mayonnaise and salad dressings (reduced-sodium). Canola, safflower, olive, soybean, and sunflower oils. Avocado. Seasoning and other foods Herbs. Spices. Seasoning mixes without salt. Unsalted popcorn and pretzels. Fat-free sweets. What foods are not recommended? The items listed may not be a complete list. Talk with your dietitian about what dietary choices are best for you. Grains Baked goods made with fat, such as croissants, muffins, or some breads. Dry pasta or rice meal packs. Vegetables Creamed or fried vegetables. Vegetables in a cheese sauce. Regular canned vegetables (not low-sodium or reduced-sodium). Regular canned tomato sauce and paste (not low-sodium or reduced-sodium). Regular tomato and vegetable juice (not low-sodium or reduced-sodium). Angie Fava. Olives. Fruits Canned fruit in a light or heavy syrup. Fried fruit. Fruit in cream or butter sauce. Meat and other protein foods Fatty cuts of meat. Ribs. Fried meat. Berniece Salines. Sausage. Bologna and other processed lunch meats. Salami. Fatback. Hotdogs. Bratwurst. Salted nuts and seeds. Canned beans with added salt. Canned or smoked fish. Whole eggs or egg yolks. Chicken or [place] with skin. Dairy Whole  or 2% milk, cream, and half-and-half. Whole or full-fat cream cheese. Whole-fat or sweetened yogurt. Full-fat cheese. Nondairy creamers. Whipped toppings. Processed cheese and cheese spreads. Fats and oils Butter. Stick margarine. Lard. Shortening. Ghee. Bacon fat. Tropical oils, such as coconut, palm kernel, or palm oil. Seasoning and other foods Salted popcorn and pretzels. Onion salt, garlic salt, seasoned salt, table salt, and sea salt. Worcestershire  sauce. Tartar sauce. Barbecue sauce. Teriyaki sauce. Soy sauce, including reduced-sodium. Steak sauce. Canned and packaged gravies. Fish sauce. Oyster sauce. Cocktail sauce. Horseradish that you find on the shelf. Ketchup. Mustard. Meat flavorings and tenderizers. Bouillon cubes. Hot sauce and Tabasco sauce. Premade or packaged marinades. Premade or packaged taco seasonings. Relishes. Regular salad dressings. Where to find more information:  National Heart, Lung, and [place]: https://wilson-eaton.com/  American Heart Association: www.heart.org Summary  The DASH eating plan is a healthy eating plan that has been shown to reduce high blood pressure (hypertension). It may also reduce your risk for type 2 diabetes, heart disease, and stroke.  With the DASH eating plan, you should limit salt (sodium) intake to 2,300 mg a day. If you have hypertension, you may need to reduce your sodium intake to 1,500 mg a day.  When on the DASH eating plan, aim to eat more fresh fruits and vegetables, whole grains, lean proteins, low-fat dairy, and heart-healthy fats.  Work with your health care provider or diet and nutrition specialist (dietitian) to adjust your eating plan to your individual calorie needs. This information is not intended to replace advice given to you by your health care provider. Make sure you discuss any questions you have with your health care provider. Document Revised: 07/25/2017 Document Reviewed: 08/05/2016 Elsevier Patient Education  2020 [employer].

## 2020-01-18 ENCOUNTER — Ambulatory Visit: Payer: Medicare Other

## 2020-01-18 DIAGNOSIS — I10 Essential (primary) hypertension: Secondary | ICD-10-CM

## 2020-01-18 DIAGNOSIS — E785 Hyperlipidemia, unspecified: Secondary | ICD-10-CM

## 2020-01-19 DIAGNOSIS — H6521 Chronic serous otitis media, right ear: Secondary | ICD-10-CM | POA: Diagnosis not present

## 2020-01-21 ENCOUNTER — Other Ambulatory Visit: Payer: Self-pay

## 2020-01-21 DIAGNOSIS — E785 Hyperlipidemia, unspecified: Secondary | ICD-10-CM

## 2020-01-21 MED ORDER — FLUTICASONE PROPIONATE 50 MCG/ACT NA SUSP: 2.0000 | Freq: Every day | NASAL | 2 refills | Status: DC | PRN

## 2020-01-21 MED ORDER — TOPIRAMATE 50 MG PO TABS: 100.0000 mg | ORAL_TABLET | Freq: Every day | ORAL | 2 refills | Status: DC

## 2020-01-21 MED ORDER — IPRATROPIUM BROMIDE 0.02 % IN SOLN: 0.5000 mg | RESPIRATORY_TRACT | 2 refills | Status: DC | PRN

## 2020-01-21 MED ORDER — UBRELVY 100 MG PO TABS: 1.0000 | ORAL_TABLET | ORAL | 6 refills | Status: DC | PRN

## 2020-01-21 MED ORDER — ESOMEPRAZOLE MAGNESIUM 20 MG PO CPDR: 20.0000 mg | DELAYED_RELEASE_CAPSULE | Freq: Every morning | ORAL | 1 refills | Status: DC

## 2020-01-21 MED ORDER — ALENDRONATE SODIUM 70 MG PO TABS: 70.0000 mg | ORAL_TABLET | ORAL | 1 refills | Status: DC

## 2020-01-21 MED ORDER — EPINEPHRINE 0.3 MG/0.3ML IJ SOAJ: 0.3000 mg | INTRAMUSCULAR | 2 refills | Status: DC | PRN

## 2020-01-21 MED ORDER — NITROGLYCERIN 0.4 MG SL SUBL: 0.4000 mg | SUBLINGUAL_TABLET | SUBLINGUAL | 6 refills | Status: DC | PRN

## 2020-01-21 MED ORDER — SERTRALINE HCL 50 MG PO TABS: 50.0000 mg | ORAL_TABLET | Freq: Every morning | ORAL | 1 refills | Status: DC

## 2020-01-21 MED ORDER — GABAPENTIN 800 MG PO TABS: 800.0000 mg | ORAL_TABLET | Freq: Three times a day (TID) | ORAL | 2 refills | Status: DC

## 2020-01-21 MED ORDER — ENTRESTO 24-26 MG PO TABS: 1.0000 | ORAL_TABLET | Freq: Two times a day (BID) | ORAL | 1 refills | Status: DC

## 2020-01-21 MED ORDER — CELECOXIB 200 MG PO CAPS: 200.0000 mg | ORAL_CAPSULE | Freq: Every morning | ORAL | 1 refills | Status: DC

## 2020-01-21 MED ORDER — FLUTICASONE FUROATE-VILANTEROL 100-25 MCG/INH IN AEPB: 1.0000 | INHALATION_SPRAY | Freq: Every day | RESPIRATORY_TRACT | 2 refills | Status: DC | PRN

## 2020-01-21 MED ORDER — CARVEDILOL 3.125 MG PO TABS: 3.1250 mg | ORAL_TABLET | Freq: Two times a day (BID) | ORAL | 2 refills | Status: DC

## 2020-01-21 MED ORDER — FUROSEMIDE 40 MG PO TABS: 40.0000 mg | ORAL_TABLET | Freq: Every day | ORAL | 1 refills | Status: DC

## 2020-01-21 MED ORDER — LINACLOTIDE 145 MCG PO CAPS: 145.0000 ug | ORAL_CAPSULE | Freq: Every day | ORAL | 2 refills | Status: DC

## 2020-01-21 MED ORDER — RANOLAZINE ER 500 MG PO TB12: 500.0000 mg | ORAL_TABLET | Freq: Two times a day (BID) | ORAL | 2 refills | Status: DC

## 2020-01-21 MED ORDER — ROSUVASTATIN CALCIUM 20 MG PO TABS: 20.0000 mg | ORAL_TABLET | Freq: Every day | ORAL | 1 refills | Status: DC

## 2020-01-25 ENCOUNTER — Other Ambulatory Visit: Payer: Self-pay

## 2020-01-25 DIAGNOSIS — E785 Hyperlipidemia, unspecified: Secondary | ICD-10-CM

## 2020-01-25 DIAGNOSIS — J449 Chronic obstructive pulmonary disease, unspecified: Secondary | ICD-10-CM

## 2020-01-25 DIAGNOSIS — I10 Essential (primary) hypertension: Secondary | ICD-10-CM

## 2020-01-25 NOTE — Progress Notes (Signed)
Appt was on 01/18/2020 with Sherre Poot, PharmD.

## 2020-01-26 DIAGNOSIS — M818 Other osteoporosis without current pathological fracture: Secondary | ICD-10-CM | POA: Diagnosis not present

## 2020-01-26 DIAGNOSIS — M810 Age-related osteoporosis without current pathological fracture: Secondary | ICD-10-CM | POA: Diagnosis not present

## 2020-02-01 ENCOUNTER — Encounter: Payer: Self-pay | Admitting: Legal Medicine

## 2020-02-03 DIAGNOSIS — M25559 Pain in unspecified hip: Secondary | ICD-10-CM | POA: Diagnosis not present

## 2020-02-03 DIAGNOSIS — M5136 Other intervertebral disc degeneration, lumbar region: Secondary | ICD-10-CM | POA: Diagnosis not present

## 2020-02-03 DIAGNOSIS — M4802 Spinal stenosis, cervical region: Secondary | ICD-10-CM | POA: Diagnosis not present

## 2020-02-03 DIAGNOSIS — M419 Scoliosis, unspecified: Secondary | ICD-10-CM | POA: Diagnosis not present

## 2020-02-03 DIAGNOSIS — Z79899 Other long term (current) drug therapy: Secondary | ICD-10-CM | POA: Diagnosis not present

## 2020-02-04 DIAGNOSIS — J441 Chronic obstructive pulmonary disease with (acute) exacerbation: Secondary | ICD-10-CM | POA: Diagnosis not present

## 2020-02-09 ENCOUNTER — Telehealth: Payer: Self-pay

## 2020-02-09 NOTE — Telephone Encounter (Cosign Needed)
Prolia coverage declined - Patient's medical health plan advised that Prolia is not covered through Medical benefit at this time.

## 2020-02-10 ENCOUNTER — Other Ambulatory Visit: Payer: Self-pay | Admitting: Neurology

## 2020-02-10 DIAGNOSIS — J449 Chronic obstructive pulmonary disease, unspecified: Secondary | ICD-10-CM | POA: Diagnosis not present

## 2020-02-11 ENCOUNTER — Other Ambulatory Visit: Payer: Self-pay

## 2020-02-11 DIAGNOSIS — I5022 Chronic systolic (congestive) heart failure: Secondary | ICD-10-CM | POA: Diagnosis not present

## 2020-02-11 DIAGNOSIS — J9611 Chronic respiratory failure with hypoxia: Secondary | ICD-10-CM | POA: Diagnosis not present

## 2020-02-11 DIAGNOSIS — I2510 Atherosclerotic heart disease of native coronary artery without angina pectoris: Secondary | ICD-10-CM | POA: Diagnosis not present

## 2020-02-11 MED ORDER — DIAZEPAM 5 MG PO TABS: 5.0000 mg | ORAL_TABLET | Freq: Three times a day (TID) | ORAL | 3 refills | Status: DC | PRN

## 2020-02-14 ENCOUNTER — Telehealth: Payer: Self-pay | Admitting: Cardiology

## 2020-02-14 ENCOUNTER — Ambulatory Visit: Payer: Medicare Other

## 2020-02-14 ENCOUNTER — Other Ambulatory Visit: Payer: Self-pay

## 2020-02-14 DIAGNOSIS — I10 Essential (primary) hypertension: Secondary | ICD-10-CM

## 2020-02-14 DIAGNOSIS — E785 Hyperlipidemia, unspecified: Secondary | ICD-10-CM

## 2020-02-14 DIAGNOSIS — J441 Chronic obstructive pulmonary disease with (acute) exacerbation: Secondary | ICD-10-CM

## 2020-02-14 DIAGNOSIS — M7918 Myalgia, other site: Secondary | ICD-10-CM | POA: Diagnosis not present

## 2020-02-14 MED ORDER — DENOSUMAB 60 MG/ML ~~LOC~~ SOSY: 60.0000 mg | PREFILLED_SYRINGE | SUBCUTANEOUS | 2 refills | Status: DC

## 2020-02-14 NOTE — Chronic Care Management (AMB) (Signed)
Chronic Care Management Pharmacy  Name: [name]  MRN: [id number] DOB: Jul 13, 1956  Chief Complaint/ HPI  [name],  64 y.o. , female presents for their Follow-Up CCM visit with the clinical pharmacist via telephone due to COVID-19 Pandemic.  PCP : Lillard Anes, MD  Their chronic conditions include: HTN, COPD, Cardiomyopathy, GERD, Osteoporosis, Chronic back pain, HLD.   Office Visits: 12/20/2019 - no new medications ordered.  10/25/2019 - Levaquin and prednisone given for COPD exacerbation post-COVID.  Consult Visit: 01/04/2020 - Chronic pain - trigger point injections ordered. Topiramate and tizanidine.  12/02/2019 - Cardiology visit. Continue beta blocker. Chem7 panel evaluated to see if higher dose of diuretic possible. Patient counseled on limiting fluid intake to avoid additional swelling.  10/19/2019 - change sumatriptan to Ubrelvy. Topiramate increased. 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  10/07/2019 - patient thinks flexeril is helping with muscle pain. 09/21/2019 - Infusion therapy ordered for COVID.  09/13/2019 - Cardiology electrophysiology. No med changes.  09/10/2019 - Arvind - pain management 09/07/2019 - Pain management  09/02/2019 - Cardiology remote device check. 08/10/2019 - pain management.  07/28/2019 - nerve pain.    Medications: Outpatient Encounter Medications as of 02/14/2020  Medication Sig  . alendronate (FOSAMAX) 70 MG tablet Take 1 tablet (70 mg total) by mouth every Monday. Take with a full glass of water on an empty stomach.  Marland Kitchen aspirin 81 MG tablet Take 81 mg by mouth daily.    . Buprenorphine HCl (BELBUCA) 900 MCG FILM Place 900 mcg inside cheek every 12 (twelve) hours.  . carvedilol (COREG) 3.125 MG tablet Take 1 tablet (3.125 mg total) by mouth 2 (two) times daily.  . celecoxib (CELEBREX) 200 MG capsule Take 1 capsule (200 mg total) by mouth in the morning.  . cyclobenzaprine (FLEXERIL) 10 MG tablet Take 10 mg  by mouth 3 (three) times daily as needed for muscle spasms.  Marland Kitchen denosumab (PROLIA) 60 MG/ML SOSY injection Inject 60 mg into the skin every 6 (six) months.  . diazepam (VALIUM) 5 MG tablet Take 1 tablet (5 mg total) by mouth 3 (three) times daily as needed.  Marland Kitchen EPINEPHrine 0.3 mg/0.3 mL IJ SOAJ injection Inject 0.3 mLs (0.3 mg total) into the muscle as needed for anaphylaxis.  Marland Kitchen esomeprazole (NEXIUM) 20 MG capsule Take 1 capsule (20 mg total) by mouth in the morning.  . fluticasone (FLONASE) 50 MCG/ACT nasal spray Place 2 sprays into both nostrils daily as needed (congestion).  . fluticasone furoate-vilanterol (BREO ELLIPTA) 100-25 MCG/INH AEPB Inhale 1 puff into the lungs daily as needed (shortness).  . furosemide (LASIX) 40 MG tablet Take 1 tablet (40 mg total) by mouth daily. Take 1 tablet daily. If you gain 3 pounds in 1 day or 5 pounds in 2 days, take 0.5 tablet (20 mg) in the afternoon.  . gabapentin (NEURONTIN) 800 MG tablet Take 1 tablet (800 mg total) by mouth 3 (three) times daily. Patient usually takes twice a day  . ipratropium (ATROVENT) 0.02 % nebulizer solution Take 2.5 mLs (0.5 mg total) by nebulization every 4 (four) hours as needed for wheezing or shortness of breath.  . levocetirizine (XYZAL) 5 MG tablet Take 5 mg by mouth daily as needed.  . linaclotide (LINZESS) 145 MCG CAPS capsule Take 1 capsule (145 mcg total) by mouth daily.  . Multiple Vitamins-Minerals (AIRBORNE GUMMIES PO) Take 3 tablets by mouth daily in the afternoon.  . nitroGLYCERIN (NITROSTAT) 0.4 MG SL tablet Place 1 tablet (  0.4 mg total) under the tongue every 5 (five) minutes as needed for chest pain.  . Oxycodone HCl 10 MG TABS Take 1 tablet by mouth every 4 (four) hours as needed.  . ranolazine (RANEXA) 500 MG 12 hr tablet Take 1 tablet (500 mg total) by mouth 2 (two) times daily.  . rosuvastatin (CRESTOR) 20 MG tablet Take 1 tablet (20 mg total) by mouth daily.  . sacubitril-valsartan (ENTRESTO) 24-26 MG Take 1  tablet by mouth 2 (two) times daily.  . sertraline (ZOLOFT) 50 MG tablet Take 1 tablet (50 mg total) by mouth in the morning.  Marland Kitchen tiZANidine (ZANAFLEX) 4 MG tablet Take 4 mg by mouth at bedtime.  . topiramate (TOPAMAX) 50 MG tablet Take 2 tablets (100 mg total) by mouth at bedtime.  Marland Kitchen Ubrogepant (UBRELVY) 100 MG TABS Take 1 tablet by mouth as needed (May repeat 1 tablet after 2 hours if needed.  Maximum 2 tablets in 24 hours).   No facility-administered encounter medications on file as of 02/14/2020.   No Known Allergies  SDOH Screenings   Alcohol Screen:   . Last Alcohol Screening Score (AUDIT):   Depression (PHQ2-9): Medium Risk  . PHQ-2 Score: 8  Financial Resource Strain:   . Difficulty of Paying Living Expenses:   Food Insecurity:   . Worried About Charity fundraiser in the Last Year:   . [place] in the Last Year:   Housing:   . Last Housing Risk Score:   Physical Activity:   . Days of Exercise per Week:   . Minutes of Exercise per Session:   Social Connections:   . Frequency of Communication with Friends and Family:   . Frequency of Social Gatherings with Friends and Family:   . Attends Religious Services:   . Active Member of Clubs or Organizations:   . Attends Archivist Meetings:   Marland Kitchen Marital Status:   Stress:   . Feeling of Stress :   Tobacco Use: High Risk  . Smoking Tobacco Use: Current Every Day Smoker  . Smokeless Tobacco Use: Never Used  Transportation Needs:   . Film/video editor (Medical):   Marland Kitchen Lack of Transportation (Non-Medical):      Current Diagnosis/Assessment:  Goals Addressed            This Visit's Progress   . Pharmacy Care Plan       CARE PLAN ENTRY  Current Barriers:  . Chronic Disease Management support, education, and care coordination needs related to Hypertension and Hyperlipidemia   Hypertension . Pharmacist Clinical Goal(s): o Over the next 90 days, patient will work with PharmD and providers to maintain  BP goal <130/80 . Current regimen:  o Entresto 24-26 mg bid, furosemide 40 mg daily, carvedilol 3.125 mg bid . Interventions: o Recommend patient continue to limit salt in diet to reduce swelling and keep good blood pressure control.  o Recommend patient continue to limit fluid intake as advised by cardiologist.  . Patient self care activities - Over the next 90 days, patient will: o Check BP monthly, document, and provide at future appointments o Ensure daily salt intake < 2300 mg/day  Hyperlipidemia . Pharmacist Clinical Goal(s): o Over the next 90 days, patient will work with PharmD and providers to maintain LDL goal < 100 . Current regimen:  o Rosuvastatin 20 mg daily . Interventions: o Recommend patient take rosuvastatin as prescribed.  o Recommend patient continue to incorporate vegetables and lean protein sources  in diet.  . Patient self care activities - Over the next 90 days, patient will: o Take medications as prescribed, eat a heart-healthy diet and contact provider with any questions/concerns.   COPD . Pharmacist Clinical Goal(s) o Over the next 90 days, patient will work with PharmD and providers to reduce use of rescue inhaler by using maintenance inhaler daily.  . Current regimen:  o Combivent inhaler 2 puffs q6h prn, Breo Ellipta 100/26 mcg/inh 1 puff in lungs daily prn . Interventions: o Recommend patient continue using Breo Ellpta daily for better symptom management.  . Patient self care activities - Over the next 90 days, patient will: o Use Breo daily to manage symptoms and contact provider as needed.   Medication management . Pharmacist Clinical Goal(s): o Over the next 90 days, patient will work with PharmD and providers to maintain optimal medication adherence . Current pharmacy:[facility] . Interventions o Comprehensive medication review performed. o Utilize [facility] pharmacy for medication synchronization, packaging and delivery . Patient self care  activities - Over the next 90 days, patient will: o Focus on medication adherence by enrolling in packaging and delivery service.  o Take medications as prescribed o Report any questions or concerns to PharmD and/or provider(s)  Please see past updates related to this goal by clicking on the "Past Updates" button in the selected goal        Heart Failure/HTN/Cardiomyopathy   Office blood pressures are  BP Readings from Last 3 Encounters:  12/20/19 100/60  12/02/19 112/64  10/25/19 893/73    Type: Systolic  Last ejection fraction: 35-40% NYHA Class: II (slight limitation of activity)  Patient has failed these meds in past: spironolactone Patient is currently uncontrolled on the following medications: Entresto 24-26 mg bid, furosemide 40 daily, nitroglycerin 0.4 mg prn, carvedilol 3.125 mg bid, ranexa 500 mg bid  We discussed diet and exercise extensively Last cardiology visit indicated that heart looked better than previous but not great. Legs are swelling quite a bit. Cardiologist recommended drinking when thirsty but not overdoing it.   Patient reports that she doesn't eat much since she had hernia surgery. Part of her stomach was removed during this procedure. She eats cereal, fish sandwich, pork chops, some vegetables or quesadilla. She doesn't add extra salt to her food.   Has only had to use nitroglycerin a few times.   Update 02/14/2020 - Patient reports good control.   Plan  Continue current medications    Hyperlipidemia   Lipid Panel     Component Value Date/Time   CHOL 164 12/20/2019 1110   TRIG 110 12/20/2019 1110   HDL 61 12/20/2019 1110   CHOLHDL 2.7 12/20/2019 1110   LDLCALC 83 12/20/2019 1110   LABVLDL 20 12/20/2019 1110     The 10-year ASCVD risk score Mikey Bussing DC Jr., et al., 2013) is: 9%   Values used to calculate the score:     Age: 64 years     Sex: Female     Is Non-Hispanic African American: No     Diabetic: No     Tobacco smoker: Yes      Systolic Blood Pressure: 428 mmHg     Is BP treated: Yes     HDL Cholesterol: 61 mg/dL     Total Cholesterol: 164 mg/dL   Patient has failed these meds in past: n/a Patient is currently controlled on the following medications: aspirin 81 mg daily, rosuvastatin 20 mg daily  We discussed:  diet and exercise extensively  Update 02/14/2020 - Patient reports good compliance with treatment.   Plan  Continue current medications   COPD / Asthma / Tobacco   Eosinophil count:   Lab Results  Component Value Date/Time   EOSPCT 1 03/26/2015 12:30 PM  %                               Eos (Absolute):  Lab Results  Component Value Date/Time   EOSABS 0.2 12/20/2019 11:10 AM    Tobacco Status:  Social History   Tobacco Use  Smoking Status Current Every Day Smoker  . Packs/day: 0.50  . Years: 30.00  . Pack years: 15.00  . Types: Cigarettes  . Last attempt to quit: 08/11/2010  . Years since quitting: 9.5  Smokeless Tobacco Never Used    Patient has failed these meds in past: advair  Patient is currently uncontrolled on the following medications: Combivent inhaler 2 puffs q6h prn wheezing/sob, flonase 50 mcg 2 sprays both nostrils daily prn, Breo Ellipta 100-25 mcg/inh 1 puff in lungs daily prn, atrovent neb solution q4h prn wheezing/sob, levocetirizine 5 mg daily prn Using maintenance inhaler regularly? No Frequency of rescue inhaler use:  multiple times per day  We discussed: Patient indicates that heat is making her use her rescue inhaler more lately. Patient has had sinus surgery and tubes put in her ears from ENT. 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 Atrovent comes from mail order pharmacy. Recommend patient use Breo daily as preventative therapy to control symptoms.   Update 02/14/2020 - Patient reports that [facility] daily is working well with breathing condition.   Plan  Continue current medications ,    and  Other  Diagnosis:GERD    Patient has failed these meds in past: n/a Patient is currently controlled on the following medications: esomeprazole 20 mg daily   We discussed:  diet and exercise extensively. Patient is eating small meals to avoid symptoms of acid reflux.   Update 02/14/2020 - Patient reports good control.   Plan  Continue current medications   Other Diagnosis:Pain and Migraine   Patient has failed these meds in past: belbuca, fentanyl, gabapentin, hydrocodone-apap, naproxen, imitrex,  Patient is currently controlled on the following medications: Ubrelvy 100 mg daily prn, Celebrex 200 mg daily, Oxycodone 10 mg q4h prn, Belbuca 900 mcg film q12hrs, tizanidine 4 mg qhs  We discussed: Roselyn Meier is taking care of migraines quickly. Having 2-3 episodes each week. Patient has degenerative disk disease and arthritis. Her pain management specialist gives her gabapentin for her nerve pain but she started jerking. Dr. Tomi Likens asked her to stop taking and see if improved. She is having some jerking in hands even without gabapentin for 2 weeks. Recommended patient check with Dr. Tomi Likens or pain management before beginning gabapentin again.   Patient had an injection in knee and back to help with pain but caused lots of nerve spasms. She was given cyclobenzaprine tid for those muscle spasms. If they return she takes them as needed.   Plan  Continue current medications and check with specialists before resuming gabapentin.   Osteopenia / Osteoporosis   Last DEXA Scan: Dexa from 01/2020  Left femur total: -2.6 (osteoporosis) AP spine -1.4 (osteopenia)   No results found for: VD25OH   Patient is taking alendronate but denies a Dexa scan in the past.   Patient has failed these meds in  past: n/a Patient is currently controlled on the following medications: alendronate 70 mg weekly  We discussed:  Recommend [phone] units of vitamin D daily. Recommend 1200 mg of calcium daily from dietary and  supplemental sources. Counseled on oral bisphosphonate administration: take in the morning, 30 minutes prior to food with 6-8 oz of water. Do not lie down for at least 30 minutes after taking. Patient needs a Dexa scan. Recommended patient add caclcium and vitamin D bid.   Update 02/14/2020 - Patient had a recent dexa scan that revealed osteoporosis. MD asked pharmacist to help with Prolia approval. Prolia approved via pharmacy benefit at $0 cost. Pharmacist coordinated delivery of Prolia and patient nursing visit for administration. Patient educated on continuing to take calcium with d bid.   Plan  Recommend begin Prolia q6 months and continue to take Calcium with D bid.   Health Maintenance   Patient is currently controlled on the following medications: Airborne gummies - immune system  We discussed:  Patient began taking airborne to rebuild her immune system after COVID.   Plan  Continue current medications  Vaccines   Reviewed and discussed patient's vaccination history. Has not had COVID vaccine and is concerned about getting vaccine.    Immunization History  Administered Date(s) Administered  . Influenza Inj Mdck Quad With Preservative 07/21/2017  . Influenza-Unspecified 06/26/2013, 05/26/2018  . Pneumococcal Polysaccharide-23 12/20/2019    Plan  Recommended patient receive annual flu vaccine in office.   Medication Management   Pt uses [facility] for all medications Uses pill box? Yes Pt endorses good compliance  We discussed: Patient would benefit from packaging and delivery. She would like to get started with both services.   Plan  Utilize [facility] pharmacy for medication synchronization, packaging and delivery   Verbal consent obtained for [facility] enhanced pharmacy services (medication synchronization, adherence packaging, delivery coordination). A medication sync plan was created to allow patient to get all medications delivered once every  30 to 90 days per patient preference. Patient understands they have freedom to choose pharmacy and clinical pharmacist will coordinate care between all prescribers and [facility].      Follow up: 1 month phone visit

## 2020-02-14 NOTE — Telephone Encounter (Signed)
Patient stated she was returning a call. However, no current notes are available. Made patient aware the call may have been an appointment reminder.

## 2020-02-15 ENCOUNTER — Ambulatory Visit (INDEPENDENT_AMBULATORY_CARE_PROVIDER_SITE_OTHER): Payer: Medicare Other

## 2020-02-15 ENCOUNTER — Other Ambulatory Visit: Payer: Self-pay

## 2020-02-15 ENCOUNTER — Encounter: Payer: Self-pay | Admitting: Cardiology

## 2020-02-15 ENCOUNTER — Ambulatory Visit (INDEPENDENT_AMBULATORY_CARE_PROVIDER_SITE_OTHER): Payer: Medicare Other | Admitting: Cardiology

## 2020-02-15 VITALS — BP 112/70 | HR 82 | Ht <= 58 in | Wt 138.2 lb

## 2020-02-15 DIAGNOSIS — I5022 Chronic systolic (congestive) heart failure: Secondary | ICD-10-CM

## 2020-02-15 DIAGNOSIS — I420 Dilated cardiomyopathy: Secondary | ICD-10-CM | POA: Diagnosis not present

## 2020-02-15 DIAGNOSIS — I2510 Atherosclerotic heart disease of native coronary artery without angina pectoris: Secondary | ICD-10-CM | POA: Diagnosis not present

## 2020-02-15 DIAGNOSIS — M810 Age-related osteoporosis without current pathological fracture: Secondary | ICD-10-CM | POA: Diagnosis not present

## 2020-02-15 DIAGNOSIS — I10 Essential (primary) hypertension: Secondary | ICD-10-CM

## 2020-02-15 MED ORDER — DENOSUMAB 60 MG/ML ~~LOC~~ SOSY
60.0000 mg | PREFILLED_SYRINGE | Freq: Once | SUBCUTANEOUS | Status: AC
  Administered 2020-02-15: 60 mg via SUBCUTANEOUS

## 2020-02-15 NOTE — Progress Notes (Signed)
Patient came in today for Prolia Injection.   Injection was given the right arm subcutaneously. Patient tolerated it well.

## 2020-02-15 NOTE — Progress Notes (Signed)
Cardiology Office Note:    Date:  02/15/2020   ID:  [name], DOB 12-11-1955, MRN [id number]  PCP:  Lillard Anes, MD  Cardiologist:  Jenne Campus, MD    Referring MD: Lillard Anes,*   Chief Complaint  Patient presents with  . Follow-up    2 MO FU   Still have swollen legs  History of Present Illness:    [name] is a 64 y.o. female with past medical history significant for nonischemic cardiomyopathy, cardiac catheterization 2016 showing no evidence of coronary artery disease, her ejection fraction is 35%.  She does have ICD present.  She comes today 2 months for follow-up overall seems to be doing fine but complaining of having swelling of lower extremities she try to increase a little bit dose of diuretic that did not help much.  Otherwise she complained of having some shortness of breath while exercise but overall not bad.  She is New York Heart Association class II.  Past Medical History:  Diagnosis Date  . Admission for long-term opiate analgesic use 10/24/2019  . AICD (automatic cardioverter/defibrillator) present 2012   . Arthritis   . Arthritis   . Arthritis of right hip 05/29/2016   Formatting of this note might be different from the original. Added automatically from request for surgery [id number]  . Asthma   . Cardiomyopathy [facility])    Overview:  Ejection fraction 45% in 2015 Ejection fraction 30 to 35% in November 2018  . CHF (congestive heart failure) ([place])   . Chronic back pain   . Chronic hypoxemic respiratory failure ([place]) 10/24/2019  . Chronic narcotic use 03/23/2015  . Chronic pain of both knees 12/21/2018   Added automatically from request for surgery [id number]  Formatting of this note might be different from the original. Added automatically from request for surgery [id number]  . Chronic systolic congestive heart failure, NYHA class 2 ([place]) 06/19/2017  . COPD (chronic obstructive pulmonary disease) ([place])   . Coronary artery disease involving  native coronary artery of native heart without angina pectoris 05/31/2015   Overview:  Abnormal stress test in fall of 2016, cardiac catheterization showed normal coronaries.  . Dilated cardiomyopathy ([place]) 06/19/2017  . 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 Dyslipidemia 05/31/2015  . Essential hypertension 05/31/2015  . GERD (gastroesophageal reflux disease)   . Heart failure   . HLD (hyperlipidemia)   . Hypertension   . ICD (implantable cardiac defibrillator) in place 2012  . Ileus following gastrointestinal surgery ([place]) 03/26/2015  . Left ventricular systolic dysfunction   . Major depressive disorder, single episode, moderate ([place]) 10/24/2019  . Migraine without aura with status migrainosus 10/24/2019  . Myocardial infarction [facility])    2011 during intestinal blockage surgery  . Nausea alone 04/19/2014  . NICM (nonischemic cardiomyopathy) ([place])   . Obstructive chronic bronchitis with exacerbation ([place]) 10/25/2019  . Other spondylosis with radiculopathy, lumbar region 10/24/2019  . Presence of left artificial hip joint 10/24/2019  . PTSD (post-traumatic stress disorder)   . PVCs (premature ventricular contractions)   . 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 Senile osteoporosis 10/24/2019  . Thyroid disease     Past Surgical History:  Procedure Laterality Date  . APPENDECTOMY    . CARDIAC CATHETERIZATION    . CARPAL TUNNEL RELEASE    . CESAREAN SECTION    . CHOLECYSTECTOMY    . CORONARY ANGIOPLASTY    .  HAND SURGERY    . ICD GENERATOR CHANGEOUT N/A 05/21/2019   Procedure: ICD GENERATOR CHANGEOUT;  Surgeon: Constance Haw, MD;  Location: [facility];  Service: Cardiovascular;  Laterality: N/A;  . ICD IMPLANT     Medtronic  . intestinal blockage 2011    . LAPAROSCOPIC ASSISTED VENTRAL HERNIA REPAIR N/A 03/23/2015   Procedure: LAPAROSCOPIC VENTRAL WALL  HERNIA REPAIR;  Surgeon: Michael Boston, MD;  Location: [facility];  Service: General;  Laterality: N/A;  With MESH  . LAPAROSCOPIC LYSIS OF ADHESIONS N/A 03/23/2015   Procedure: LAPAROSCOPIC LYSIS OF ADHESIONS;  Surgeon: Michael Boston, MD;  Location: [facility];  Service: General;  Laterality: N/A;  . NECK SURGERY     fused  . TONSILLECTOMY    . ULNAR NERVE TRANSPOSITION  01/23/2012   Procedure: ULNAR NERVE DECOMPRESSION/TRANSPOSITION;  Surgeon: Ophelia Charter, MD;  Location: [facility];  Service: Neurosurgery;  Laterality: Left;  LEFT ulnar nerve decompression    Current Medications: Current Meds  Medication Sig  . alendronate (FOSAMAX) 70 MG tablet Take 1 tablet (70 mg total) by mouth every Monday. Take with a full glass of water on an empty stomach.  Marland Kitchen aspirin 81 MG tablet Take 81 mg by mouth daily.    . Buprenorphine HCl (BELBUCA) 900 MCG FILM Place 900 mcg inside cheek every 12 (twelve) hours.  . carvedilol (COREG) 3.125 MG tablet Take 1 tablet (3.125 mg total) by mouth 2 (two) times daily.  . celecoxib (CELEBREX) 200 MG capsule Take 1 capsule (200 mg total) by mouth in the morning.  . cyclobenzaprine (FLEXERIL) 10 MG tablet Take 10 mg by mouth 3 (three) times daily as needed for muscle spasms.  Marland Kitchen denosumab (PROLIA) 60 MG/ML SOSY injection Inject 60 mg into the skin every 6 (six) months.  . diazepam (VALIUM) 5 MG tablet Take 1 tablet (5 mg total) by mouth 3 (three) times daily as needed.  Marland Kitchen EPINEPHrine 0.3 mg/0.3 mL IJ SOAJ injection Inject 0.3 mLs (0.3 mg total) into the muscle as needed for anaphylaxis.  Marland Kitchen esomeprazole (NEXIUM) 20 MG capsule Take 1 capsule (20 mg total) by mouth in the morning.  . fluticasone (FLONASE) 50 MCG/ACT nasal spray Place 2 sprays into both nostrils daily as needed (congestion).  . fluticasone furoate-vilanterol (BREO ELLIPTA) 100-25 MCG/INH AEPB Inhale 1 puff into the lungs daily as needed (shortness).  . furosemide (LASIX) 40 MG tablet Take 1 tablet (40 mg total)  by mouth daily. Take 1 tablet daily. If you gain 3 pounds in 1 day or 5 pounds in 2 days, take 0.5 tablet (20 mg) in the afternoon.  . gabapentin (NEURONTIN) 800 MG tablet Take 1 tablet (800 mg total) by mouth 3 (three) times daily. Patient usually takes twice a day  . ipratropium (ATROVENT) 0.02 % nebulizer solution Take 2.5 mLs (0.5 mg total) by nebulization every 4 (four) hours as needed for wheezing or shortness of breath.  . levocetirizine (XYZAL) 5 MG tablet Take 5 mg by mouth daily as needed.  . linaclotide (LINZESS) 145 MCG CAPS capsule Take 1 capsule (145 mcg total) by mouth daily.  . Multiple Vitamins-Minerals (AIRBORNE GUMMIES PO) Take 3 tablets by mouth daily in the afternoon.  . nitroGLYCERIN (NITROSTAT) 0.4 MG SL tablet Place 1 tablet (0.4 mg total) under the tongue every 5 (five) minutes as needed for chest pain.  . Oxycodone HCl 10 MG TABS Take 1 tablet by mouth every 4 (four) hours as needed.  . ranolazine (  RANEXA) 500 MG 12 hr tablet Take 1 tablet (500 mg total) by mouth 2 (two) times daily.  . rosuvastatin (CRESTOR) 20 MG tablet Take 1 tablet (20 mg total) by mouth daily.  . sacubitril-valsartan (ENTRESTO) 24-26 MG Take 1 tablet by mouth 2 (two) times daily.  . sertraline (ZOLOFT) 50 MG tablet Take 1 tablet (50 mg total) by mouth in the morning.  Marland Kitchen tiZANidine (ZANAFLEX) 4 MG tablet Take 4 mg by mouth at bedtime.  . topiramate (TOPAMAX) 50 MG tablet Take 2 tablets (100 mg total) by mouth at bedtime.  Marland Kitchen Ubrogepant (UBRELVY) 100 MG TABS Take 1 tablet by mouth as needed (May repeat 1 tablet after 2 hours if needed.  Maximum 2 tablets in 24 hours).     Allergies:   Patient has no known allergies.   Social History   Socioeconomic History  . Marital status: Single    Spouse name: Not on file  . Number of children: 3  . Years of education: Not on file  . Highest education level: Not on file  Occupational History  . Occupation: disabled  Tobacco Use  . Smoking status: Current  Every Day Smoker    Packs/day: 0.50    Years: 30.00    Pack years: 15.00    Types: Cigarettes    Last attempt to quit: 08/11/2010    Years since quitting: 9.5  . Smokeless tobacco: Never Used  Vaping Use  . Vaping Use: Never used  Substance and Sexual Activity  . Alcohol use: Yes    Alcohol/week: 2.0 standard drinks    Types: 1 Cans of beer, 1 Standard drinks or equivalent per week    Comment: seldom  . Drug use: No  . Sexual activity: Not Currently  Other Topics Concern  . Not on file  Social History Narrative   One level home with boyfriend   Caffeine - coffee 1-2 cups/day; Green tea 4-5 bottles a day   Exercise - some    Right handed      Social Determinants of Health   Financial Resource Strain:   . Difficulty of Paying Living Expenses:   Food Insecurity:   . Worried About Charity fundraiser in the Last Year:   . Arboriculturist in the Last Year:   Transportation Needs:   . Film/video editor (Medical):   Marland Kitchen Lack of Transportation (Non-Medical):   Physical Activity:   . Days of Exercise per Week:   . Minutes of Exercise per Session:   Stress:   . Feeling of Stress :   Social Connections:   . Frequency of Communication with Friends and Family:   . Frequency of Social Gatherings with Friends and Family:   . Attends Religious Services:   . Active Member of Clubs or Organizations:   . Attends Archivist Meetings:   Marland Kitchen Marital Status:      Family History: The patient's family history includes Alcohol abuse in her father; Cancer in an other family member; Heart attack in her father, mother, and another family member; Heart failure in an other family member; Hypertension in her father and mother. There is no history of Anesthesia problems, Hypotension, Malignant hyperthermia, or Pseudochol deficiency. ROS:   Please see the history of present illness.    All 14 point review of systems negative except as described per history of present  illness  EKGs/Labs/Other Studies Reviewed:      Recent Labs: 07/20/2019: TSH 1.530 12/02/2019: NT-Pro BNP  1,044 12/20/2019: ALT 6; BUN 18; Creatinine, Ser 1.13; Hemoglobin 11.7; Platelets 352; Potassium 3.9; Sodium 143  Recent Lipid Panel    Component Value Date/Time   CHOL 164 12/20/2019 1110   TRIG 110 12/20/2019 1110   HDL 61 12/20/2019 1110   CHOLHDL 2.7 12/20/2019 1110   LDLCALC 83 12/20/2019 1110    Physical Exam:    VS:  BP 112/70 (BP Location: Right Arm, Patient Position: Sitting, Cuff Size: Normal)   Pulse 82   Ht '4\' 10"'  (1.473 m)   Wt 138 lb 3.2 oz (62.7 kg)   SpO2 98%   BMI 28.88 kg/m     Wt Readings from Last 3 Encounters:  02/15/20 138 lb 3.2 oz (62.7 kg)  12/20/19 142 lb 6.4 oz (64.6 kg)  12/02/19 144 lb 3.2 oz (65.4 kg)     GEN:  Well nourished, well developed in no acute distress HEENT: Normal NECK: No JVD; No carotid bruits LYMPHATICS: No lymphadenopathy CARDIAC: RRR, no murmurs, no rubs, no gallops RESPIRATORY:  Clear to auscultation without rales, wheezing or rhonchi  ABDOMEN: Soft, non-tender, non-distended MUSCULOSKELETAL:  No edema; No deformity  SKIN: Warm and dry LOWER EXTREMITIES: no swelling NEUROLOGIC:  Alert and oriented x 3 PSYCHIATRIC:  Normal affect   ASSESSMENT:     PLAN:    In order of problems listed above:  1. Cardiomyopathy: Ejection fraction 35%.  On appropriate medication she is able to tolerate which I will continue.  Her legs are swollen I will ask her to have complete metabolic panel, I will be looking also at albumin level.  I will also check her TSH.  Based on that we will decide about future therapy. 2. ICD present normal function no recent discharges. 3. Coronary artery disease: She does not have any cardiac catheterization done in 2016 after abnormal stress test show normal coronaries. 4. Congestive heart failure: New York Heart Association class II but still some swelling of lower extremities, therefore mildly  decompensated.  Plan is as outlined above.   Medication Adjustments/Labs and Tests Ordered: Current medicines are reviewed at length with the patient today.  Concerns regarding medicines are outlined above.  Orders Placed This Encounter  Procedures  . Comp Met (CMET)  . TSH   Medication changes: No orders of the defined types were placed in this encounter.   Signed, Park Liter, MD, [facility] 02/15/2020 2:00 PM    [place]

## 2020-02-15 NOTE — Patient Instructions (Addendum)
Visit Information  Goals Addressed            This Visit's Progress   . Pharmacy Care Plan       CARE PLAN ENTRY  Current Barriers:  . Chronic Disease Management support, education, and care coordination needs related to Hypertension and Hyperlipidemia   Hypertension . Pharmacist Clinical Goal(s): o Over the next 90 days, patient will work with PharmD and providers to maintain BP goal <130/80 . Current regimen:  o Entresto 24-26 mg bid, furosemide 40 mg daily, carvedilol 3.125 mg bid . Interventions: o Recommend patient continue to limit salt in diet to reduce swelling and keep good blood pressure control.  o Recommend patient continue to limit fluid intake as advised by cardiologist.  . Patient self care activities - Over the next 90 days, patient will: o Check BP monthly, document, and provide at future appointments o Ensure daily salt intake < 2300 mg/day  Hyperlipidemia . Pharmacist Clinical Goal(s): o Over the next 90 days, patient will work with PharmD and providers to maintain LDL goal < 100 . Current regimen:  o Rosuvastatin 20 mg daily . Interventions: o Recommend patient take rosuvastatin as prescribed.  o Recommend patient continue to incorporate vegetables and lean protein sources in diet.  . Patient self care activities - Over the next 90 days, patient will: o Take medications as prescribed, eat a heart-healthy diet and contact provider with any questions/concerns.   COPD . Pharmacist Clinical Goal(s) o Over the next 90 days, patient will work with PharmD and providers to reduce use of rescue inhaler by using maintenance inhaler daily.  . Current regimen:  o Combivent inhaler 2 puffs q6h prn, Breo Ellipta 100/26 mcg/inh 1 puff in lungs daily prn . Interventions: o Recommend patient continue using Breo Ellpta daily for better symptom management.  . Patient self care activities - Over the next 90 days, patient will: o Use Breo daily to manage symptoms and  contact provider as needed.   Medication management . Pharmacist Clinical Goal(s): o Over the next 90 days, patient will work with PharmD and providers to maintain optimal medication adherence . Current pharmacy:[facility] . Interventions o Comprehensive medication review performed. o Utilize [facility] pharmacy for medication synchronization, packaging and delivery . Patient self care activities - Over the next 90 days, patient will: o Focus on medication adherence by enrolling in packaging and delivery service.  o Take medications as prescribed o Report any questions or concerns to PharmD and/or provider(s)  Please see past updates related to this goal by clicking on the "Past Updates" button in the selected goal         The patient verbalized understanding of instructions provided today and declined a print copy of patient instruction materials.   Telephone follow up appointment with pharmacy team member scheduled for: 02/2020  Sherre Poot, PharmD, [facility] Clinical Pharmacist [facility] [phone] (office) [phone] (mobile)  Denosumab injection What is this medicine? DENOSUMAB (den oh sue mab) slows bone breakdown. Prolia is used to treat osteoporosis in women after menopause and in men, and in people who are taking corticosteroids for 6 months or more. Delton See is used to treat a high calcium level due to cancer and to prevent bone fractures and other bone problems caused by multiple myeloma or cancer bone metastases. Delton See is also used to treat giant cell tumor of the bone. This medicine may be used for other purposes; ask your health care provider or pharmacist if you have questions.  COMMON BRAND NAME(S): Prolia, XGEVA What should I tell my health care provider before I take this medicine? They need to know if you have any of these conditions:  dental disease  having surgery or tooth extraction  infection  kidney disease  low levels of calcium or  Vitamin D in the blood  malnutrition  on hemodialysis  skin conditions or sensitivity  thyroid or parathyroid disease  an unusual reaction to denosumab, other medicines, foods, dyes, or preservatives  pregnant or trying to get pregnant  breast-feeding How should I use this medicine? This medicine is for injection under the skin. It is given by a health care professional in a hospital or clinic setting. A special MedGuide will be given to you before each treatment. Be sure to read this information carefully each time. For Prolia, talk to your pediatrician regarding the use of this medicine in children. Special care may be needed. For Delton See, talk to your pediatrician regarding the use of this medicine in children. While this drug may be prescribed for children as young as 13 years for selected conditions, precautions do apply. Overdosage: If you think you have taken too much of this medicine contact a poison control center or emergency room at once. NOTE: This medicine is only for you. Do not share this medicine with others. What if I miss a dose? It is important not to miss your dose. Call your doctor or health care professional if you are unable to keep an appointment. What may interact with this medicine? Do not take this medicine with any of the following medications:  other medicines containing denosumab This medicine may also interact with the following medications:  medicines that lower your chance of fighting infection  steroid medicines like prednisone or cortisone This list may not describe all possible interactions. Give your health care provider a list of all the medicines, herbs, non-prescription drugs, or dietary supplements you use. Also tell them if you smoke, drink alcohol, or use illegal drugs. Some items may interact with your medicine. What should I watch for while using this medicine? Visit your doctor or health care professional for regular checks on your  progress. Your doctor or health care professional may order blood tests and other tests to see how you are doing. Call your doctor or health care professional for advice if you get a fever, chills or sore throat, or other symptoms of a cold or flu. Do not treat yourself. This drug may decrease your body's ability to fight infection. Try to avoid being around people who are sick. You should make sure you get enough calcium and vitamin D while you are taking this medicine, unless your doctor tells you not to. Discuss the foods you eat and the vitamins you take with your health care professional. See your dentist regularly. Brush and floss your teeth as directed. Before you have any dental work done, tell your dentist you are receiving this medicine. Do not become pregnant while taking this medicine or for 5 months after stopping it. Talk with your doctor or health care professional about your birth control options while taking this medicine. Women should inform their doctor if they wish to become pregnant or think they might be pregnant. There is a potential for serious side effects to an unborn child. Talk to your health care professional or pharmacist for more information. What side effects may I notice from receiving this medicine? Side effects that you should report to your doctor or health care professional  as soon as possible:  allergic reactions like skin rash, itching or hives, swelling of the face, lips, or tongue  bone pain  breathing problems  dizziness  jaw pain, especially after dental work  redness, blistering, peeling of the skin  signs and symptoms of infection like fever or chills; cough; sore throat; pain or trouble passing urine  signs of low calcium like fast heartbeat, muscle cramps or muscle pain; pain, tingling, numbness in the hands or feet; seizures  unusual bleeding or bruising  unusually weak or tired Side effects that usually do not require medical attention  (report to your doctor or health care professional if they continue or are bothersome):  constipation  diarrhea  headache  joint pain  loss of appetite  muscle pain  runny nose  tiredness  upset stomach This list may not describe all possible side effects. Call your doctor for medical advice about side effects. You may report side effects to FDA at 1-800-FDA-1088. Where should I keep my medicine? This medicine is only given in a clinic, doctor's office, or other health care setting and will not be stored at home. NOTE: This sheet is a summary. It may not cover all possible information. If you have questions about this medicine, talk to your doctor, pharmacist, or health care provider.  2020 Elsevier/Gold Standard (2017-12-19 16:10:44)

## 2020-02-15 NOTE — Telephone Encounter (Signed)
Called patient informed her there are no recent documented calls. We concluded the call was most likely  the reminder call about her appointment today. No further questions.

## 2020-02-15 NOTE — Patient Instructions (Signed)
Medication Instructions:  Your physician recommends that you continue on your current medications as directed. Please refer to the Current Medication list given to you today.  *If you need a refill on your cardiac medications before your next appointment, please call your pharmacy*   Lab Work: Your physician recommends that you return for lab work today: cmp, tsh If you have labs (blood work) drawn today and your tests are completely normal, you will receive your results only by: Marland Kitchen MyChart Message (if you have MyChart) OR . A paper copy in the mail If you have any lab test that is abnormal or we need to change your treatment, we will call you to review the results.   Testing/Procedures: None.    Follow-Up: At [facility], you and your health needs are our priority.  As part of our continuing mission to provide you with exceptional heart care, we have created designated Provider Care Teams.  These Care Teams include your primary Cardiologist (physician) and Advanced Practice Providers (APPs -  Physician Assistants and Nurse Practitioners) who all work together to provide you with the care you need, when you need it.  We recommend signing up for the patient portal called "MyChart".  Sign up information is provided on this After Visit Summary.  MyChart is used to connect with patients for Virtual Visits (Telemedicine).  Patients are able to view lab/test results, encounter notes, upcoming appointments, etc.  Non-urgent messages can be sent to your provider as well.   To learn more about what you can do with MyChart, go to [email or web address].    Your next appointment:   6 week(s)  The format for your next appointment:   In Person  Provider:   Jenne Campus, MD   Other Instructions

## 2020-02-16 LAB — COMPREHENSIVE METABOLIC PANEL
ALT: 5 IU/L (ref 0–32)
AST: 18 IU/L (ref 0–40)
Albumin/Globulin Ratio: 1.9 (ref 1.2–2.2)
Albumin: 4.2 g/dL (ref 3.8–4.8)
Alkaline Phosphatase: 64 IU/L (ref 48–121)
BUN/Creatinine Ratio: 16 (ref 12–28)
BUN: 18 mg/dL (ref 8–27)
Bilirubin Total: 0.3 mg/dL (ref 0.0–1.2)
CO2: 26 mmol/L (ref 20–29)
Calcium: 9.9 mg/dL (ref 8.7–10.3)
Chloride: 101 mmol/L (ref 96–106)
Creatinine, Ser: 1.12 mg/dL — ABNORMAL HIGH (ref 0.57–1.00)
GFR calc Af Amer: 60 mL/min/{1.73_m2} (ref >59)
GFR calc non Af Amer: 52 mL/min/{1.73_m2} — ABNORMAL LOW (ref >59)
Globulin, Total: 2.2 g/dL (ref 1.5–4.5)
Glucose: 164 mg/dL — ABNORMAL HIGH (ref 65–99)
Potassium: 4.4 mmol/L (ref 3.5–5.2)
Sodium: 142 mmol/L (ref 134–144)
Total Protein: 6.4 g/dL (ref 6.0–8.5)

## 2020-02-16 LAB — TSH: TSH: 0.882 u[IU]/mL (ref 0.450–4.500)

## 2020-02-18 ENCOUNTER — Ambulatory Visit: Payer: Medicare Other | Admitting: Neurology

## 2020-02-18 ENCOUNTER — Telehealth: Payer: Self-pay | Admitting: Cardiology

## 2020-02-18 NOTE — Telephone Encounter (Signed)
Patient returning call for lab results. 

## 2020-02-18 NOTE — Telephone Encounter (Signed)
Called patient back and discussed lab results with her per Dr. Marthann Schiller recommendations. She thanked me and had no further questions. I told her to call back if she has any concerns. She thanked me.

## 2020-02-21 DIAGNOSIS — H6591 Unspecified nonsuppurative otitis media, right ear: Secondary | ICD-10-CM | POA: Diagnosis not present

## 2020-02-21 DIAGNOSIS — Z9622 Myringotomy tube(s) status: Secondary | ICD-10-CM | POA: Diagnosis not present

## 2020-03-02 ENCOUNTER — Ambulatory Visit (INDEPENDENT_AMBULATORY_CARE_PROVIDER_SITE_OTHER): Payer: Medicare Other | Admitting: *Deleted

## 2020-03-02 DIAGNOSIS — I420 Dilated cardiomyopathy: Secondary | ICD-10-CM | POA: Diagnosis not present

## 2020-03-06 LAB — CUP PACEART REMOTE DEVICE CHECK
Battery Remaining Longevity: 123 mo
Battery Voltage: 3.01 V
Brady Statistic AP VP Percent: 0.02 %
Brady Statistic AP VS Percent: 19.48 %
Brady Statistic AS VP Percent: 0.03 %
Brady Statistic AS VS Percent: 80.47 %
Brady Statistic RA Percent Paced: 19.26 %
Brady Statistic RV Percent Paced: 0.05 %
Date Time Interrogation Session: 20210711014447
HighPow Impedance: 72 Ohm
Implantable Lead Implant Date: 20120502
Implantable Lead Implant Date: 20120502
Implantable Lead Location: 753859
Implantable Lead Location: 753860
Implantable Lead Model: 4076
Implantable Lead Model: 7122
Implantable Pulse Generator Implant Date: 20200925
Lead Channel Impedance Value: 342 Ohm
Lead Channel Impedance Value: 551 Ohm
Lead Channel Impedance Value: 722 Ohm
Lead Channel Pacing Threshold Amplitude: 0.5 V
Lead Channel Pacing Threshold Amplitude: 0.875 V
Lead Channel Pacing Threshold Pulse Width: 0.4 ms
Lead Channel Pacing Threshold Pulse Width: 0.4 ms
Lead Channel Sensing Intrinsic Amplitude: 2.75 mV
Lead Channel Sensing Intrinsic Amplitude: 2.75 mV
Lead Channel Sensing Intrinsic Amplitude: 9.875 mV
Lead Channel Sensing Intrinsic Amplitude: 9.875 mV
Lead Channel Setting Pacing Amplitude: 1.5 V
Lead Channel Setting Pacing Amplitude: 2 V
Lead Channel Setting Pacing Pulse Width: 0.4 ms
Lead Channel Setting Sensing Sensitivity: 0.3 mV

## 2020-03-06 NOTE — Progress Notes (Signed)
Remote ICD transmission.   

## 2020-03-08 DIAGNOSIS — E7800 Pure hypercholesterolemia, unspecified: Secondary | ICD-10-CM | POA: Diagnosis not present

## 2020-03-08 DIAGNOSIS — M545 Low back pain: Secondary | ICD-10-CM | POA: Diagnosis not present

## 2020-03-08 DIAGNOSIS — Z20822 Contact with and (suspected) exposure to covid-19: Secondary | ICD-10-CM | POA: Diagnosis not present

## 2020-03-08 DIAGNOSIS — R300 Dysuria: Secondary | ICD-10-CM | POA: Diagnosis not present

## 2020-03-08 DIAGNOSIS — D539 Nutritional anemia, unspecified: Secondary | ICD-10-CM | POA: Diagnosis not present

## 2020-03-08 DIAGNOSIS — R5383 Other fatigue: Secondary | ICD-10-CM | POA: Diagnosis not present

## 2020-03-08 DIAGNOSIS — Z79899 Other long term (current) drug therapy: Secondary | ICD-10-CM | POA: Diagnosis not present

## 2020-03-08 DIAGNOSIS — G8929 Other chronic pain: Secondary | ICD-10-CM | POA: Diagnosis not present

## 2020-03-08 DIAGNOSIS — M129 Arthropathy, unspecified: Secondary | ICD-10-CM | POA: Diagnosis not present

## 2020-03-08 DIAGNOSIS — E559 Vitamin D deficiency, unspecified: Secondary | ICD-10-CM | POA: Diagnosis not present

## 2020-03-11 DIAGNOSIS — J449 Chronic obstructive pulmonary disease, unspecified: Secondary | ICD-10-CM | POA: Diagnosis not present

## 2020-03-13 DIAGNOSIS — I2510 Atherosclerotic heart disease of native coronary artery without angina pectoris: Secondary | ICD-10-CM | POA: Diagnosis not present

## 2020-03-13 DIAGNOSIS — J9611 Chronic respiratory failure with hypoxia: Secondary | ICD-10-CM | POA: Diagnosis not present

## 2020-03-13 DIAGNOSIS — I5022 Chronic systolic (congestive) heart failure: Secondary | ICD-10-CM | POA: Diagnosis not present

## 2020-03-16 NOTE — Progress Notes (Signed)
This encounter was created in error - please disregard.

## 2020-03-23 DIAGNOSIS — J441 Chronic obstructive pulmonary disease with (acute) exacerbation: Secondary | ICD-10-CM | POA: Diagnosis not present

## 2020-03-24 ENCOUNTER — Ambulatory Visit: Payer: Medicare Other | Admitting: Legal Medicine

## 2020-03-28 DIAGNOSIS — Z79899 Other long term (current) drug therapy: Secondary | ICD-10-CM | POA: Diagnosis not present

## 2020-03-28 DIAGNOSIS — M5136 Other intervertebral disc degeneration, lumbar region: Secondary | ICD-10-CM | POA: Diagnosis not present

## 2020-03-28 DIAGNOSIS — M419 Scoliosis, unspecified: Secondary | ICD-10-CM | POA: Diagnosis not present

## 2020-03-28 DIAGNOSIS — M545 Low back pain: Secondary | ICD-10-CM | POA: Diagnosis not present

## 2020-03-28 DIAGNOSIS — G8929 Other chronic pain: Secondary | ICD-10-CM | POA: Diagnosis not present

## 2020-03-29 ENCOUNTER — Encounter: Payer: Self-pay | Admitting: Legal Medicine

## 2020-03-29 ENCOUNTER — Other Ambulatory Visit: Payer: Self-pay

## 2020-03-29 ENCOUNTER — Ambulatory Visit (INDEPENDENT_AMBULATORY_CARE_PROVIDER_SITE_OTHER): Payer: Medicare Other | Admitting: Legal Medicine

## 2020-03-29 VITALS — BP 90/56 | HR 51 | Temp 97.5°F | Resp 16 | Ht 60.0 in | Wt 137.2 lb

## 2020-03-29 DIAGNOSIS — J9611 Chronic respiratory failure with hypoxia: Secondary | ICD-10-CM

## 2020-03-29 DIAGNOSIS — J449 Chronic obstructive pulmonary disease, unspecified: Secondary | ICD-10-CM | POA: Diagnosis not present

## 2020-03-29 DIAGNOSIS — Z6826 Body mass index (BMI) 26.0-26.9, adult: Secondary | ICD-10-CM | POA: Insufficient documentation

## 2020-03-29 DIAGNOSIS — E785 Hyperlipidemia, unspecified: Secondary | ICD-10-CM | POA: Diagnosis not present

## 2020-03-29 DIAGNOSIS — I5022 Chronic systolic (congestive) heart failure: Secondary | ICD-10-CM

## 2020-03-29 DIAGNOSIS — K219 Gastro-esophageal reflux disease without esophagitis: Secondary | ICD-10-CM

## 2020-03-29 DIAGNOSIS — F321 Major depressive disorder, single episode, moderate: Secondary | ICD-10-CM

## 2020-03-29 DIAGNOSIS — I2510 Atherosclerotic heart disease of native coronary artery without angina pectoris: Secondary | ICD-10-CM

## 2020-03-29 DIAGNOSIS — I10 Essential (primary) hypertension: Secondary | ICD-10-CM | POA: Diagnosis not present

## 2020-03-29 HISTORY — DX: Body mass index (BMI) 26.0-26.9, adult: Z68.26

## 2020-03-29 NOTE — Progress Notes (Signed)
Subjective:  Patient ID: [name], female    DOB: 25-Jan-1956  Age: 64 y.o. MRN: [id number]  Chief Complaint  Patient presents with  . Hypertension  . Hyperlipidemia  . Gastroesophageal Reflux  . Depression    HPI: Chronic visit  Patient presents for follow up of hypertension.  Patient tolerating valsartan well with side effects.  Patient was diagnosed with hypertension 2010 so has been treated for hypertension for 10 years.Patient is working on maintaining diet and exercise regimen and follows up as directed. Complication include CHF.  Patient presents with hyperlipidemia.  Compliance with treatment has been good; patient takes medicines as directed, maintains low cholesterol diet, follows up as directed, and maintains exercise regimen.  Patient is using crestor without problems.  Patient has gastroesophageal reflux symptoms withesophagitis and LTRD.  The symptoms are moderate intensity.  Length of symptoms 10 years.  Medicines include nexium.  Complications include none.  This patient has major depression for years.  PHQ9 =5.  Patient is having less anhedonia.  The patient has improving future plans and prospects.  The depression is worse with stress.  The patient is not exercising and working on behavior to improve mental health.  Patient is not seeing a therapist or psychiatrist.  na  Patient is on diazepam, .   Current Outpatient Medications on File Prior to Visit  Medication Sig Dispense Refill  . aspirin 81 MG tablet Take 81 mg by mouth daily.      . Buprenorphine HCl (BELBUCA) 900 MCG FILM Place 900 mcg inside cheek every 12 (twelve) hours.    . carvedilol (COREG) 3.125 MG tablet Take 1 tablet (3.125 mg total) by mouth 2 (two) times daily. 180 tablet 2  . celecoxib (CELEBREX) 200 MG capsule Take 1 capsule (200 mg total) by mouth in the morning. 90 capsule 1  . clobetasol ointment (TEMOVATE) 0.05 % Apply topically.    . cyclobenzaprine (FEXMID) 7.5 MG tablet Take by mouth.     . cyclobenzaprine (FLEXERIL) 10 MG tablet Take 10 mg by mouth 3 (three) times daily as needed for muscle spasms.    Marland Kitchen denosumab (PROLIA) 60 MG/ML SOSY injection Inject 60 mg into the skin every 6 (six) months. 180 mL 2  . diazepam (VALIUM) 5 MG tablet Take 1 tablet (5 mg total) by mouth 3 (three) times daily as needed. 30 tablet 3  . EPINEPHrine 0.3 mg/0.3 mL IJ SOAJ injection Inject 0.3 mLs (0.3 mg total) into the muscle as needed for anaphylaxis. 1 each 2  . esomeprazole (NEXIUM) 20 MG capsule Take 1 capsule (20 mg total) by mouth in the morning. 90 capsule 1  . fluticasone (FLONASE) 50 MCG/ACT nasal spray Place 2 sprays into both nostrils daily as needed (congestion). 18 mL 2  . fluticasone furoate-vilanterol (BREO ELLIPTA) 100-25 MCG/INH AEPB Inhale 1 puff into the lungs daily as needed (shortness). 28 each 2  . furosemide (LASIX) 40 MG tablet Take 1 tablet (40 mg total) by mouth daily. Take 1 tablet daily. If you gain 3 pounds in 1 day or 5 pounds in 2 days, take 0.5 tablet (20 mg) in the afternoon. 90 tablet 1  . gabapentin (NEURONTIN) 800 MG tablet Take 1 tablet (800 mg total) by mouth 3 (three) times daily. Patient usually takes twice a day 270 tablet 2  . ipratropium (ATROVENT) 0.02 % nebulizer solution Take 2.5 mLs (0.5 mg total) by nebulization every 4 (four) hours as needed for wheezing or shortness of breath. 150 mL  2  . Ketoprofen 25 MG CAPS Take by mouth.    . levocetirizine (XYZAL) 5 MG tablet Take 5 mg by mouth daily as needed.    . linaclotide (LINZESS) 145 MCG CAPS capsule Take 1 capsule (145 mcg total) by mouth daily. 90 capsule 2  . Multiple Vitamins-Minerals (AIRBORNE GUMMIES PO) Take 3 tablets by mouth daily in the afternoon.    . nitroGLYCERIN (NITROSTAT) 0.4 MG SL tablet Place 1 tablet (0.4 mg total) under the tongue every 5 (five) minutes as needed for chest pain. 25 tablet 6  . Oxycodone HCl 10 MG TABS Take 1 tablet by mouth every 4 (four) hours as needed.    Marland Kitchen  oxyCODONE-acetaminophen (PERCOCET) 10-325 MG tablet Take 1 tablet by mouth 4 (four) times daily as needed.    . ranolazine (RANEXA) 500 MG 12 hr tablet Take 1 tablet (500 mg total) by mouth 2 (two) times daily. 180 tablet 2  . rosuvastatin (CRESTOR) 20 MG tablet Take 1 tablet (20 mg total) by mouth daily. 90 tablet 1  . sacubitril-valsartan (ENTRESTO) 24-26 MG Take 1 tablet by mouth 2 (two) times daily. 180 tablet 1  . sertraline (ZOLOFT) 50 MG tablet Take 1 tablet (50 mg total) by mouth in the morning. 90 tablet 1  . tiZANidine (ZANAFLEX) 4 MG tablet Take 4 mg by mouth at bedtime.    . topiramate (TOPAMAX) 50 MG tablet Take 2 tablets (100 mg total) by mouth at bedtime. 240 tablet 2  . Ubrogepant (UBRELVY) 100 MG TABS Take 1 tablet by mouth as needed (May repeat 1 tablet after 2 hours if needed.  Maximum 2 tablets in 24 hours). 16 tablet 6   No current facility-administered medications on file prior to visit.   Past Medical History:  Diagnosis Date  . Admission for long-term opiate analgesic use 10/24/2019  . AICD (automatic cardioverter/defibrillator) present 2012   . Arthritis   . Arthritis   . Arthritis of right hip 05/29/2016   Formatting of this note might be different from the original. Added automatically from request for surgery [id number]  . Asthma   . Cardiomyopathy [facility])    Overview:  Ejection fraction 45% in 2015 Ejection fraction 30 to 35% in November 2018  . CHF (congestive heart failure) ([place])   . Chronic back pain   . Chronic hypoxemic respiratory failure ([place]) 10/24/2019  . Chronic narcotic use 03/23/2015  . Chronic pain of both knees 12/21/2018   Added automatically from request for surgery [id number]  Formatting of this note might be different from the original. Added automatically from request for surgery [phone]  . Chronic systolic congestive heart failure, NYHA class 2 ([place]) 06/19/2017  . COPD (chronic obstructive pulmonary disease) ([place])   . Coronary artery disease involving  native coronary artery of native heart without angina pectoris 05/31/2015   Overview:  Abnormal stress test in fall of 2016, cardiac catheterization showed normal coronaries.  . Dilated cardiomyopathy ([place]) 06/19/2017  . 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 Dyslipidemia 05/31/2015  . Essential hypertension 05/31/2015  . GERD (gastroesophageal reflux disease)   . Heart failure   . HLD (hyperlipidemia)   . Hypertension   . ICD (implantable cardiac defibrillator) in place 2012  . Ileus following gastrointestinal surgery ([place]) 03/26/2015  . Left ventricular systolic dysfunction   . Major depressive disorder, single episode, moderate ([place]) 10/24/2019  . Migraine without aura with status migrainosus 10/24/2019  . Myocardial infarction ([place])  2011 during intestinal blockage surgery  . Nausea alone 04/19/2014  . NICM (nonischemic cardiomyopathy) ([place])   . Obstructive chronic bronchitis with exacerbation ([place]) 10/25/2019  . Other spondylosis with radiculopathy, lumbar region 10/24/2019  . Presence of left artificial hip joint 10/24/2019  . PTSD (post-traumatic stress disorder)   . PVCs (premature ventricular contractions)   . 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 Senile osteoporosis 10/24/2019  . Thyroid disease    Past Surgical History:  Procedure Laterality Date  . APPENDECTOMY    . CARDIAC CATHETERIZATION    . CARPAL TUNNEL RELEASE    . CESAREAN SECTION    . CHOLECYSTECTOMY    . CORONARY ANGIOPLASTY    . HAND SURGERY    . ICD GENERATOR CHANGEOUT N/A 05/21/2019   Procedure: ICD GENERATOR CHANGEOUT;  Surgeon: Constance Haw, MD;  Location: [facility];  Service: Cardiovascular;  Laterality: N/A;  . ICD IMPLANT     [employer]  . intestinal blockage 2011    . LAPAROSCOPIC ASSISTED VENTRAL HERNIA REPAIR N/A 03/23/2015   Procedure: LAPAROSCOPIC VENTRAL WALL  HERNIA REPAIR;  Surgeon: Michael Boston, MD;  Location: [facility];  Service: General;  Laterality: N/A;  With MESH  . LAPAROSCOPIC LYSIS OF ADHESIONS N/A 03/23/2015   Procedure: LAPAROSCOPIC LYSIS OF ADHESIONS;  Surgeon: Michael Boston, MD;  Location: [facility];  Service: General;  Laterality: N/A;  . NECK SURGERY     fused  . TONSILLECTOMY    . ULNAR NERVE TRANSPOSITION  01/23/2012   Procedure: ULNAR NERVE DECOMPRESSION/TRANSPOSITION;  Surgeon: Ophelia Charter, MD;  Location: [place] [facility];  Service: Neurosurgery;  Laterality: Left;  LEFT ulnar nerve decompression    Family History  Problem Relation Age of Onset  . Heart attack Other   . Cancer Other   . Heart failure Other   . Hypertension Mother   . Heart attack Mother   . Alcohol abuse Father   . Hypertension Father   . Heart attack Father   . Anesthesia problems Neg Hx   . Hypotension Neg Hx   . Malignant hyperthermia Neg Hx   . Pseudochol deficiency Neg Hx    Social History   Socioeconomic History  . Marital status: Single    Spouse name: Not on file  . Number of children: 3  . Years of education: Not on file  . Highest education level: Not on file  Occupational History  . Occupation: disabled  Tobacco Use  . Smoking status: Current Every Day Smoker    Packs/day: 0.50    Years: 30.00    Pack years: 15.00    Types: Cigarettes    Last attempt to quit: 08/11/2010    Years since quitting: 9.6  . Smokeless tobacco: Never Used  Vaping Use  . Vaping Use: Never used  Substance and Sexual Activity  . Alcohol use: Yes    Alcohol/week: 2.0 standard drinks    Types: 1 Cans of beer, 1 Standard drinks or equivalent per week    Comment: seldom  . Drug use: No  . Sexual activity: Not Currently  Other Topics Concern  . Not on file  Social History Narrative   One level home with boyfriend   Caffeine - coffee 1-2 cups/day; Green tea 4-5 bottles a day   Exercise - some    Right handed      Social Determinants of Health    Financial Resource Strain:   . Difficulty of  Paying Living Expenses:   Food Insecurity:   . Worried About Charity fundraiser in the Last Year:   . Arboriculturist in the Last Year:   Transportation Needs:   . Film/video editor (Medical):   Marland Kitchen Lack of Transportation (Non-Medical):   Physical Activity:   . Days of Exercise per Week:   . Minutes of Exercise per Session:   Stress:   . Feeling of Stress :   Social Connections:   . Frequency of Communication with Friends and Family:   . Frequency of Social Gatherings with Friends and Family:   . Attends Religious Services:   . Active Member of Clubs or Organizations:   . Attends Archivist Meetings:   Marland Kitchen Marital Status:     Review of Systems  Constitutional: Negative.   HENT: Negative.   Eyes: Negative.   Respiratory: Positive for shortness of breath.   Cardiovascular: Negative.   Gastrointestinal: Negative.   Endocrine: Negative.   Genitourinary: Negative.   Musculoskeletal: Positive for arthralgias.  Skin: Negative.   Neurological: Negative.   Psychiatric/Behavioral: Negative.      Objective:  BP (!) 90/56   Pulse (!) 51   Temp (!) 97.5 F (36.4 C)   Resp 16   Ht 5' (1.524 m)   Wt 137 lb 3.2 oz (62.2 kg)   BMI 26.80 kg/m   BP/Weight 03/29/2020 02/15/2020 7/49/4496  Systolic BP 90 759 163  Diastolic BP 56 70 60  Wt. (Lbs) 137.2 138.2 142.4  BMI 26.8 28.88 29.09    Physical Exam Vitals reviewed.  Constitutional:      Appearance: Normal appearance.  HENT:     Head: Normocephalic and atraumatic.     Right Ear: Tympanic membrane, ear canal and external ear normal.     Left Ear: Tympanic membrane, ear canal and external ear normal.     Mouth/Throat:     Mouth: Mucous membranes are moist.     Pharynx: Oropharynx is clear.  Eyes:     Extraocular Movements: Extraocular movements intact.     Conjunctiva/sclera: Conjunctivae normal.     Pupils: Pupils are equal, round, and reactive to light.   Cardiovascular:     Rate and Rhythm: Normal rate and regular rhythm.     Pulses: Normal pulses.     Heart sounds: Normal heart sounds.  Pulmonary:     Effort: Pulmonary effort is normal.     Breath sounds: Normal breath sounds.  Abdominal:     General: Abdomen is flat. Bowel sounds are normal.     Palpations: Abdomen is soft.  Musculoskeletal:        General: Normal range of motion.  Skin:    General: Skin is warm and dry.     Capillary Refill: Capillary refill takes less than 2 seconds.  Neurological:     General: No focal deficit present.     Mental Status: She is alert and oriented to person, place, and time.  Psychiatric:        Mood and Affect: Mood normal.        Thought Content: Thought content normal.        Judgment: Judgment normal.    Depression screen [facility] 2/9 03/29/2020 12/22/2019  Decreased Interest 1 1  Down, Depressed, Hopeless 1 1  PHQ - 2 Score 2 2  Altered sleeping 0 2  Tired, decreased energy 1 2  Change in appetite 2 2  Feeling bad or failure about yourself  0 0  Trouble concentrating 0 0  Moving slowly or fidgety/restless 0 0  Suicidal thoughts 0 0  PHQ-9 Score 5 8  Difficult doing work/chores Not difficult at all -        Lab Results  Component Value Date   WBC 9.3 12/20/2019   HGB 11.7 12/20/2019   HCT 36.1 12/20/2019   PLT 352 12/20/2019   GLUCOSE 164 (H) 02/15/2020   CHOL 164 12/20/2019   TRIG 110 12/20/2019   HDL 61 12/20/2019   LDLCALC 83 12/20/2019   ALT 5 02/15/2020   AST 18 02/15/2020   NA 142 02/15/2020   K 4.4 02/15/2020   CL 101 02/15/2020   CREATININE 1.12 (H) 02/15/2020   BUN 18 02/15/2020   CO2 26 02/15/2020   TSH 0.882 02/15/2020      Assessment & Plan:   1. Essential hypertension An individual hypertension care plan was established and reinforced today.  The patient's status was assessed using clinical findings on exam and labs or diagnostic tests. The patient's success at meeting treatment goals on disease  specific evidence-based guidelines and found to be well controlled. SELF MANAGEMENT: The patient and I together assessed ways to personally work towards obtaining the recommended goals. RECOMMENDATIONS: avoid decongestants found in common cold remedies, decrease consumption of alcohol, perform routine monitoring of BP with home BP cuff, exercise, reduction of dietary salt, take medicines as prescribed, try not to miss doses and quit smoking.  Regular exercise and maintaining a healthy weight is needed.  Stress reduction may help. A CLINICAL SUMMARY including written plan identify barriers to care unique to individual due to social or financial issues.  We attempt to mutually creat solutions for individual and family understanding.  2. Dyslipidemia AN INDIVIDUAL CARE PLAN for hyperlipidemia/ cholesterol was established and reinforced today.  The patient's status was assessed using clinical findings on exam, lab and other diagnostic tests. The patient's disease status was assessed based on evidence-based guidelines and found to be well controlled. MEDICATIONS were reviewed. SELF MANAGEMENT GOALS have been discussed and patient's success at attaining the goal of low cholesterol was assessed. RECOMMENDATION given include regular exercise 3 days a week and low cholesterol/low fat diet. CLINICAL SUMMARY including written plan to identify barriers unique to the patient due to social or economic  reasons was discussed.  3. Chronic obstructive pulmonary disease, unspecified COPD type ([place]) An individualize plan was formulated for care of COPD.  Treatment is evidence based.  She will continue on inhalers, avoid smoking and smoke.  Regular exercise with help with dyspnea. Routine follow ups and medication compliance is needed.  4. Gastroesophageal reflux disease, unspecified whether esophagitis present Plan of care was formulated today.  She is doing well.  A plan of care was formulated using patient exam,  tests and other sources to optimize care using evidence based information.  Recommend no smoking, no eating after supper, avoid fatty foods, elevate Head of bed, avoid tight fitting clothing.  Continue on nexium.  5. Major depressive disorder, single episode, moderate ([place]) Patient's depression is controlled with sertraline.   Anhedonia better.  PHQ 9 was performed score 5. An individual care plan was established or reinforced today.  The patient's disease status was assessed using clinical findings on exam, labs, and or other diagnostic testing to determine patient's success in meeting treatment goals based on disease specific evidence-based guidelines and found to be improving Recommendations include stay on medicines  6. Coronary artery disease involving native coronary artery  of native heart without angina pectoris Patient's CAD was assessed using history and physical along with other information to maximize treatment.  Evidence based criteria was use in deciding proper management for this disease process.  Patient's CAD is under good control.therapy continue present treatment.  7. Chronic systolic congestive heart failure, NYHA class 2 ([place]) An individualized care plan was established and reinforced.  The patient's disease status was assessed using clinical finding son exam today, labs, and/or other diagnostic testing such as x-rays, to determine the patient's success in meeting treatment goals based on disease-based guidelines and found to be improving. But not at goal yet. Medications prescriptions no changes Laboratory tests ordered to be performed today include none. RECOMMENDATIONS: given include see cardiology.  Call physician is patient gains 3 lbs in one day or 5 lbs for one week.  Call for progressive PND, orthopnea or increased pedal edema.  8. BMI 26.0-26.9,adult Continue present diet and exercise.  9. Chronic hypoxemic respiratory failure (HCC) Breathing has improved with CHF and  she is now not on oxygen continuously.         Follow-up: Return in about 3 months (around 06/29/2020) for fasting.  An After Visit Summary was printed and given to the patient.  [place] [phone]

## 2020-04-05 ENCOUNTER — Ambulatory Visit (INDEPENDENT_AMBULATORY_CARE_PROVIDER_SITE_OTHER): Payer: Medicare Other | Admitting: Cardiology

## 2020-04-05 ENCOUNTER — Encounter: Payer: Self-pay | Admitting: Cardiology

## 2020-04-05 ENCOUNTER — Other Ambulatory Visit: Payer: Self-pay

## 2020-04-05 ENCOUNTER — Other Ambulatory Visit: Payer: Self-pay | Admitting: Legal Medicine

## 2020-04-05 VITALS — BP 108/70 | HR 60 | Ht 60.0 in | Wt 137.4 lb

## 2020-04-05 DIAGNOSIS — I10 Essential (primary) hypertension: Secondary | ICD-10-CM

## 2020-04-05 DIAGNOSIS — I5022 Chronic systolic (congestive) heart failure: Secondary | ICD-10-CM | POA: Diagnosis not present

## 2020-04-05 DIAGNOSIS — J441 Chronic obstructive pulmonary disease with (acute) exacerbation: Secondary | ICD-10-CM

## 2020-04-05 DIAGNOSIS — Z95810 Presence of automatic (implantable) cardiac defibrillator: Secondary | ICD-10-CM

## 2020-04-05 DIAGNOSIS — I420 Dilated cardiomyopathy: Secondary | ICD-10-CM | POA: Diagnosis not present

## 2020-04-05 NOTE — Patient Instructions (Signed)

## 2020-04-05 NOTE — Progress Notes (Signed)
Cardiology Office Note:    Date:  04/05/2020   ID:  [name], DOB [birth year], MRN [id number]  PCP:  Lillard Anes, MD  Cardiologist:  Jenne Campus, MD    Referring MD: Lillard Anes,*   Chief Complaint  Patient presents with  . Follow-up    c/o legs swelling    History of Present Illness:    [name] is a 64 y.o. female with past medical history significant for nonischemic cardiomyopathy with latest ejection fraction of 35%, BiV ICD, essential hypertension, fibromyalgia, comes today 2 months of follow-up.  Concern was some swelling of lower extremities.  We did repeat echocardiogram at the end of April which showed ejection fraction 35 to 40%.  She is on appropriate medications however I cannot increase dosages of medication because of blood pressure being very low.  She comes today 2 months of follow-up complains still of having some swollen legs however on physical examination I do not see that.  She also complained of having some shortness of breath but as usual.  Overall she is happy the way she feels.  She is actually asking me about potentially taking Covid 19 and vaccine which I strongly recommended.  Past Medical History:  Diagnosis Date  . Admission for long-term opiate analgesic use 10/24/2019  . AICD (automatic cardioverter/defibrillator) present 2012   . Arthritis   . Arthritis   . Arthritis of right hip 05/29/2016   Formatting of this note might be different from the original. Added automatically from request for surgery [phone]  . Asthma   . Cardiomyopathy [facility])    Overview:  Ejection fraction 45% in 2015 Ejection fraction 30 to 35% in November 2018  . CHF (congestive heart failure) ([place])   . Chronic back pain   . Chronic hypoxemic respiratory failure ([place]) 10/24/2019  . Chronic narcotic use 03/23/2015  . Chronic pain of both knees 12/21/2018   Added automatically from request for surgery [id number]  Formatting of this note might be different  from the original. Added automatically from request for surgery [id number]  . Chronic systolic congestive heart failure, NYHA class 2 ([place]) 06/19/2017  . COPD (chronic obstructive pulmonary disease) ([place])   . Coronary artery disease involving native coronary artery of native heart without angina pectoris 05/31/2015   Overview:  Abnormal stress test in fall of 2016, cardiac catheterization showed normal coronaries.  . Dilated cardiomyopathy ([place]) 06/19/2017  . 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 Dyslipidemia 05/31/2015  . Essential hypertension 05/31/2015  . GERD (gastroesophageal reflux disease)   . Heart failure   . HLD (hyperlipidemia)   . Hypertension   . ICD (implantable cardiac defibrillator) in place 2012  . Ileus following gastrointestinal surgery ([place]) 03/26/2015  . Left ventricular systolic dysfunction   . Major depressive disorder, single episode, moderate ([place]) 10/24/2019  . Migraine without aura with status migrainosus 10/24/2019  . Myocardial infarction [facility])    2011 during intestinal blockage surgery  . Nausea alone 04/19/2014  . NICM (nonischemic cardiomyopathy) ([place])   . Obstructive chronic bronchitis with exacerbation ([place]) 10/25/2019  . Other spondylosis with radiculopathy, lumbar region 10/24/2019  . Presence of left artificial hip joint 10/24/2019  . PTSD (post-traumatic stress disorder)   . PVCs (premature ventricular contractions)   . 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 Senile osteoporosis 10/24/2019  . Thyroid disease     Past Surgical History:  Procedure Laterality Date  . APPENDECTOMY    . CARDIAC CATHETERIZATION    . CARPAL TUNNEL RELEASE    . CESAREAN SECTION    . CHOLECYSTECTOMY    . CORONARY ANGIOPLASTY    . HAND SURGERY    . ICD GENERATOR CHANGEOUT N/A 05/21/2019   Procedure: ICD GENERATOR CHANGEOUT;  Surgeon: Constance Haw,  MD;  Location: [facility] CV LAB;  Service: Cardiovascular;  Laterality: N/A;  . ICD IMPLANT     Medtr[employer]  . intestinal blockage 2011    . LAPAROSCOPIC ASSISTED VENTRAL HERNIA REPAIR N/A 03/23/2015   Procedure: LAPAROSCOPIC VENTRAL WALL HERNIA REPAIR;  Surgeon: Michael Boston, MD;  Location: [facility] ORS;  Service: General;  Laterality: N/A;  With MESH  . LAPAROSCOPIC LYSIS OF ADHESIONS N/A 03/23/2015   Procedure: LAPAROSCOPIC LYSIS OF ADHESIONS;  Surgeon: Michael Boston, MD;  Location: [facility] ORS;  Service: General;  Laterality: N/A;  . NECK SURGERY     fused  . TONSILLECTOMY    . ULNAR NERVE TRANSPOSITION  01/23/2012   Procedure: ULNAR NERVE DECOMPRESSION/TRANSPOSITION;  Surgeon: Ophelia Charter, MD;  Location: [facility] NEURO ORS;  Service: Neurosurgery;  Laterality: Left;  LEFT ulnar nerve decompression    Current Medications: Current Meds  Medication Sig  . aspirin 81 MG tablet Take 81 mg by mouth daily.    . Buprenorphine HCl (BELBUCA) 900 MCG FILM Place 900 mcg inside cheek every 12 (twelve) hours.  . carvedilol (COREG) 3.125 MG tablet Take 1 tablet (3.125 mg total) by mouth 2 (two) times daily.  . celecoxib (CELEBREX) 200 MG capsule Take 1 capsule (200 mg total) by mouth in the morning.  . clobetasol ointment (TEMOVATE) 0.05 % Apply topically.  . cyclobenzaprine (FEXMID) 7.5 MG tablet Take by mouth.  . cyclobenzaprine (FLEXERIL) 10 MG tablet Take 10 mg by mouth 3 (three) times daily as needed for muscle spasms.  Marland Kitchen denosumab (PROLIA) 60 MG/ML SOSY injection Inject 60 mg into the skin every 6 (six) months.  . diazepam (VALIUM) 5 MG tablet Take 1 tablet (5 mg total) by mouth 3 (three) times daily as needed.  Marland Kitchen EPINEPHrine 0.3 mg/0.3 mL IJ SOAJ injection Inject 0.3 mLs (0.3 mg total) into the muscle as needed for anaphylaxis.  Marland Kitchen esomeprazole (NEXIUM) 20 MG capsule Take 1 capsule (20 mg total) by mouth in the morning.  . fluticasone (FLONASE) 50 MCG/ACT nasal spray Place 2 sprays into both nostrils daily  as needed (congestion).  . fluticasone furoate-vilanterol (BREO ELLIPTA) 100-25 MCG/INH AEPB Inhale 1 puff into the lungs daily as needed (shortness).  . furosemide (LASIX) 40 MG tablet Take 1 tablet (40 mg total) by mouth daily. Take 1 tablet daily. If you gain 3 pounds in 1 day or 5 pounds in 2 days, take 0.5 tablet (20 mg) in the afternoon.  . gabapentin (NEURONTIN) 800 MG tablet Take 1 tablet (800 mg total) by mouth 3 (three) times daily. Patient usually takes twice a day  . ipratropium (ATROVENT) 0.02 % nebulizer solution Take 2.5 mLs (0.5 mg total) by nebulization every 4 (four) hours as needed for wheezing or shortness of breath.  . Ketoprofen 25 MG CAPS Take by mouth.  . levocetirizine (XYZAL) 5 MG tablet Take 5 mg by mouth daily as needed.  . linaclotide (LINZESS) 145 MCG CAPS capsule Take 1 capsule (145 mcg total) by mouth daily.  . Multiple Vitamins-Minerals (AIRBORNE GUMMIES PO) Take 3 tablets by mouth daily in the afternoon.  . nitroGLYCERIN (NITROSTAT) 0.4 MG SL  tablet Place 1 tablet (0.4 mg total) under the tongue every 5 (five) minutes as needed for chest pain.  . Oxycodone HCl 10 MG TABS Take 1 tablet by mouth every 4 (four) hours as needed.  Marland Kitchen oxyCODONE-acetaminophen (PERCOCET) 10-325 MG tablet Take 1 tablet by mouth 4 (four) times daily as needed.  . ranolazine (RANEXA) 500 MG 12 hr tablet Take 1 tablet (500 mg total) by mouth 2 (two) times daily.  . rosuvastatin (CRESTOR) 20 MG tablet Take 1 tablet (20 mg total) by mouth daily.  . sacubitril-valsartan (ENTRESTO) 24-26 MG Take 1 tablet by mouth 2 (two) times daily.  . sertraline (ZOLOFT) 50 MG tablet Take 1 tablet (50 mg total) by mouth in the morning.  Marland Kitchen tiZANidine (ZANAFLEX) 4 MG tablet Take 4 mg by mouth at bedtime.  . topiramate (TOPAMAX) 50 MG tablet Take 2 tablets (100 mg total) by mouth at bedtime.  Marland Kitchen Ubrogepant (UBRELVY) 100 MG TABS Take 1 tablet by mouth as needed (May repeat 1 tablet after 2 hours if needed.  Maximum 2  tablets in 24 hours).     Allergies:   Patient has no known allergies.   Social History   Socioeconomic History  . Marital status: Single    Spouse name: Not on file  . Number of children: 3  . Years of education: Not on file  . Highest education level: Not on file  Occupational History  . Occupation: disabled  Tobacco Use  . Smoking status: Current Every Day Smoker    Packs/day: 0.50    Years: 30.00    Pack years: 15.00    Types: Cigarettes    Last attempt to quit: 08/11/2010    Years since quitting: 9.6  . Smokeless tobacco: Never Used  Vaping Use  . Vaping Use: Never used  Substance and Sexual Activity  . Alcohol use: Yes    Alcohol/week: 2.0 standard drinks    Types: 1 Cans of beer, 1 Standard drinks or equivalent per week    Comment: seldom  . Drug use: No  . Sexual activity: Not Currently  Other Topics Concern  . Not on file  Social History Narrative   One level home with boyfriend   Caffeine - coffee 1-2 cups/day; Green tea 4-5 bottles a day   Exercise - some    Right handed      Social Determinants of Health   Financial Resource Strain:   . Difficulty of Paying Living Expenses:   Food Insecurity:   . Worried About Charity fundraiser in the Last Year:   . Arboriculturist in the Last Year:   Transportation Needs:   . Film/video editor (Medical):   Marland Kitchen Lack of Transportation (Non-Medical):   Physical Activity:   . Days of Exercise per Week:   . Minutes of Exercise per Session:   Stress:   . Feeling of Stress :   Social Connections:   . Frequency of Communication with Friends and Family:   . Frequency of Social Gatherings with Friends and Family:   . Attends Religious Services:   . Active Member of Clubs or Organizations:   . Attends Archivist Meetings:   Marland Kitchen Marital Status:      Family History: The patient's family history includes Alcohol abuse in her father; Cancer in an other family member; Heart attack in her father, mother, and  another family member; Heart failure in an other family member; Hypertension in her father and mother.  There is no history of Anesthesia problems, Hypotension, Malignant hyperthermia, or Pseudochol deficiency. ROS:   Please see the history of present illness.    All 14 point review of systems negative except as described per history of present illness  EKGs/Labs/Other Studies Reviewed:      Recent Labs: 12/02/2019: NT-Pro BNP 1,044 12/20/2019: Hemoglobin 11.7; Platelets 352 02/15/2020: ALT 5; BUN 18; Creatinine, Ser 1.12; Potassium 4.4; Sodium 142; TSH 0.882  Recent Lipid Panel    Component Value Date/Time   CHOL 164 12/20/2019 1110   TRIG 110 12/20/2019 1110   HDL 61 12/20/2019 1110   CHOLHDL 2.7 12/20/2019 1110   LDLCALC 83 12/20/2019 1110    Physical Exam:    VS:  BP 108/70   Pulse 60   Ht 5' (1.524 m)   Wt 137 lb 6.4 oz (62.3 kg)   SpO2 100%   BMI 26.83 kg/m     Wt Readings from Last 3 Encounters:  04/05/20 137 lb 6.4 oz (62.3 kg)  03/29/20 137 lb 3.2 oz (62.2 kg)  02/15/20 138 lb 3.2 oz (62.7 kg)     GEN:  Well nourished, well developed in no acute distress HEENT: Normal NECK: No JVD; No carotid bruits LYMPHATICS: No lymphadenopathy CARDIAC: RRR, no murmurs, no rubs, no gallops RESPIRATORY:  Clear to auscultation without rales, wheezing or rhonchi  ABDOMEN: Soft, non-tender, non-distended MUSCULOSKELETAL:  No edema; No deformity  SKIN: Warm and dry LOWER EXTREMITIES: no swelling NEUROLOGIC:  Alert and oriented x 3 PSYCHIATRIC:  Normal affect   ASSESSMENT:    1. Chronic systolic congestive heart failure, NYHA class 2 ([place])   2. Dilated cardiomyopathy ([place])   3. Essential hypertension   4. Obstructive chronic bronchitis with exacerbation ([place])   5. 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    PLAN:    In order of problems listed above: Chronic systolic congestive heart failure, New York Heart Association class II.  She is only on small  dose of Entresto because of low blood pressure as well as on Coreg only 3.125 again the biggest obstacle we facing is her blood pressure being low.  But overall seems to be compensated today on the physical exam. 2.  Dilated cardiomyopathy: Plan as outlined above. 3.  Essential hypertension.  "Blood pressure is low right now and we facing opposite problems for now. COPD.  Stable. 5.  Dual ICD present followed by our EP team.  I recommended continuation of present medications.  I did review her K PN for this visit.  I do have her LDL from last year showing 89 as well as HDL 61, will make arrangements for fasting lipid profile to be redone.  I did check her creatinine which was checked in June of this year being 1.12.   Medication Adjustments/Labs and Tests Ordered: Current medicines are reviewed at length with the patient today.  Concerns regarding medicines are outlined above.  No orders of the defined types were placed in this encounter.  Medication changes: No orders of the defined types were placed in this encounter.   Signed, Park Liter, MD, [facility] 04/05/2020 1:33 PM    [facility]

## 2020-04-11 DIAGNOSIS — I5022 Chronic systolic (congestive) heart failure: Secondary | ICD-10-CM | POA: Diagnosis not present

## 2020-04-11 DIAGNOSIS — J9611 Chronic respiratory failure with hypoxia: Secondary | ICD-10-CM | POA: Diagnosis not present

## 2020-04-11 DIAGNOSIS — J449 Chronic obstructive pulmonary disease, unspecified: Secondary | ICD-10-CM | POA: Diagnosis not present

## 2020-04-11 DIAGNOSIS — I2510 Atherosclerotic heart disease of native coronary artery without angina pectoris: Secondary | ICD-10-CM | POA: Diagnosis not present

## 2020-04-13 ENCOUNTER — Other Ambulatory Visit: Payer: Self-pay | Admitting: Legal Medicine

## 2020-04-14 ENCOUNTER — Other Ambulatory Visit: Payer: Self-pay | Admitting: Legal Medicine

## 2020-04-20 ENCOUNTER — Other Ambulatory Visit: Payer: Self-pay | Admitting: Legal Medicine

## 2020-04-21 ENCOUNTER — Telehealth: Payer: Self-pay

## 2020-04-21 NOTE — Progress Notes (Signed)
Chronic Care Management Pharmacy Assistant   Name: [name]  MRN: [id number] DOB: [birth year]  Reason for Encounter: Medication Review  Patient Questions:  1.  Have you seen any other providers since your last visit? Yes, see note below  2.  Any changes in your medicines or health? No  03/29/20- OV to PCP - Dr Henrene Pastor. No med changes 04/05/20- OV to Cardiology - Dr Agustin Cree - no med changes.      PCP : Lillard Anes, MD  Allergies:  No Known Allergies  Medications: Outpatient Encounter Medications as of 04/21/2020  Medication Sig   alendronate (FOSAMAX) 70 MG tablet TAKE 1 TABLET BY MOUTH ONCE WEEKLY FOR OSTEOPOROSIS   aspirin 81 MG tablet Take 81 mg by mouth daily.     BREO ELLIPTA 100-25 MCG/INH AEPB INHALE ONE PUFF INTO THE LUNG ONCE DAILY   Buprenorphine HCl (BELBUCA) 900 MCG FILM Place 900 mcg inside cheek every 12 (twelve) hours.   carvedilol (COREG) 3.125 MG tablet Take 1 tablet (3.125 mg total) by mouth 2 (two) times daily.   celecoxib (CELEBREX) 200 MG capsule Take 1 capsule (200 mg total) by mouth in the morning.   clobetasol ointment (TEMOVATE) 0.05 % Apply topically.   cyclobenzaprine (FEXMID) 7.5 MG tablet Take by mouth.   cyclobenzaprine (FLEXERIL) 10 MG tablet Take 10 mg by mouth 3 (three) times daily as needed for muscle spasms.   denosumab (PROLIA) 60 MG/ML SOSY injection Inject 60 mg into the skin every 6 (six) months.   diazepam (VALIUM) 5 MG tablet Take 1 tablet (5 mg total) by mouth 3 (three) times daily as needed.   EPINEPHrine 0.3 mg/0.3 mL IJ SOAJ injection Inject 0.3 mLs (0.3 mg total) into the muscle as needed for anaphylaxis.   esomeprazole (NEXIUM) 20 MG capsule TAKE 1 CAPSULE BY MOUTH DAILY   fluticasone (FLONASE) 50 MCG/ACT nasal spray Place 2 sprays into both nostrils daily as needed (congestion).   furosemide (LASIX) 40 MG tablet Take 1 tablet (40 mg total) by mouth daily. Take 1 tablet daily. If you gain 3 pounds in 1  day or 5 pounds in 2 days, take 0.5 tablet (20 mg) in the afternoon.   gabapentin (NEURONTIN) 800 MG tablet Take 1 tablet (800 mg total) by mouth 3 (three) times daily. Patient usually takes twice a day   ipratropium (ATROVENT) 0.02 % nebulizer solution Take 2.5 mLs (0.5 mg total) by nebulization every 4 (four) hours as needed for wheezing or shortness of breath.   Ketoprofen 25 MG CAPS Take by mouth.   levocetirizine (XYZAL) 5 MG tablet Take 5 mg by mouth daily as needed.   linaclotide (LINZESS) 145 MCG CAPS capsule Take 1 capsule (145 mcg total) by mouth daily.   Multiple Vitamins-Minerals (AIRBORNE GUMMIES PO) Take 3 tablets by mouth daily in the afternoon.   nitroGLYCERIN (NITROSTAT) 0.4 MG SL tablet Place 1 tablet (0.4 mg total) under the tongue every 5 (five) minutes as needed for chest pain.   Oxycodone HCl 10 MG TABS Take 1 tablet by mouth every 4 (four) hours as needed.   oxyCODONE-acetaminophen (PERCOCET) 10-325 MG tablet Take 1 tablet by mouth 4 (four) times daily as needed.   ranolazine (RANEXA) 500 MG 12 hr tablet Take 1 tablet (500 mg total) by mouth 2 (two) times daily.   rosuvastatin (CRESTOR) 20 MG tablet Take 1 tablet (20 mg total) by mouth daily.   sacubitril-valsartan (ENTRESTO) 24-26 MG Take 1 tablet by mouth 2 (  two) times daily.   sertraline (ZOLOFT) 50 MG tablet Take 1 tablet (50 mg total) by mouth in the morning.   tiZANidine (ZANAFLEX) 4 MG tablet Take 4 mg by mouth at bedtime.   topiramate (TOPAMAX) 50 MG tablet Take 2 tablets (100 mg total) by mouth at bedtime.   Ubrogepant (UBRELVY) 100 MG TABS Take 1 tablet by mouth as needed (May repeat 1 tablet after 2 hours if needed.  Maximum 2 tablets in 24 hours).   No facility-administered encounter medications on file as of 04/21/2020.    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   Chronic pain of both knees 53/97/6734   Chronic systolic congestive heart failure, NYHA class 2 ([place]) 06/19/2017   Dilated cardiomyopathy ([place]) 06/19/2017   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   Arthritis of right hip 05/29/2016   Coronary artery disease involving native coronary artery of native heart without angina pectoris 05/31/2015   Dyslipidemia 05/31/2015   Essential hypertension 05/31/2015   Ileus following gastrointestinal surgery ([place]) 03/26/2015   Chronic narcotic use 03/23/2015   GERD (gastroesophageal reflux disease)    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 03/23/2015 04/19/2014   Nausea alone 04/19/2014   COPD (chronic obstructive pulmonary disease) ([place])    Cardiomyopathy ([place])    Chronic back pain     Goals Addressed   None     Follow-Up:  Coordination of Enhanced Pharmacy Services   Reviewed chart for medication changes ahead of medication coordination call.  03/29/20- OV to PCP - Dr Henrene Pastor. No med changes 04/05/20- OV to Cardiology - Dr Agustin Cree - no med changes.   BP Readings from Last 3 Encounters:  04/05/20 108/70  03/29/20 (!) 90/56  02/15/20 112/70    No results found for: HGBA1C   Patient obtains medications through Adherence Packaging  30 Days   Last adherence delivery included:   Carvedilol 3.125 mg BID - 1 breakfast, 1 EM  Celecoxib 200 mg - 1 breakfast  Diazepam 5mg  - TID PRN ( VIALS)  Nexium 20 mg - 1  BB  Furosemide 40 mg - 1 am and  tablet afternoon as needed. Please put am dose in packaging and give 15 tablets in bottle.  Gabapentin 800 mg  - 1 breakfast, 1 EM   Roselyn Meier 100mg  - PRN - (Vials)   Topiramate 100mg  - 1 bedtime  Xyzal 5 mg - daily PRN (VIALS)-   Linzess 145 mcg - 1 Breakfast  Entresto 24-26mg  - 1 breakfast, 1 EM   Ranexa 500 mg - 1 breakfast  Sertraline 500 mg - 1 breakfast  Rosuvastatin 20mg  - 1 breakfast  Alendronate -1 BB  Patient declined the following medications last month:  Flonase - PRN - doesnt need - will call when needed  Ipratropium- bromide nebulizer solution - PRN - will call when needed  Combivent - PRN - will let us know when needed  Breo- Ellipta - PRN - will let us know when needed  Nitroglycerin -  PRN - will let us know when needed  Patient is due for next adherence delivery on: 05/01/2020. Called patient and reviewed medications and coordinated delivery.  This delivery to include:  Carvedilol 3.125 mg BID - 1 breakfast, 1 EM  Celecoxib 200 mg - 1 breakfast  Diazepam 5mg  - TID PRN ( VIALS)  Nexium 20 mg - 1 BB  Breo- Ellipta   Furosemide 40 mg - 1 am and  tablet afternoon as needed. Please put am dose in packaging and give 15 tablets in bottle.  Gabapentin 800 mg  - 1 breakfast, 1 EM   Roselyn Meier 100mg  - PRN - (Vials)   Topiramate 100mg  - 1 bedtime  Xyzal 5 mg - daily PRN (VIALS)  Linzess 145 mcg - 1 Breakfast  Entresto 24-26mg  - 1 breakfast, 1 EM   Ranexa 500 mg - 1 breakfast  Sertraline 500 mg - 1 breakfast  Rosuvastatin 20mg  - 1 breakfast  Epi Pen PRN- NEEDS ON THIS FILL Patient declined the following medications:  Flonase - PRN - doesnt need - will call when needed  Ipratropium- bromide nebulizer solution - PRN - will call when needed  Combivent - PRN - will let us know when needed  Nitroglycerin - PRN - will let us know when needed   Alendronate 70mg  -Not taking this one anymore, goes into  office for shot now.   Patient does not need refills for any medications at this time.   Confirmed delivery date of 05/01/2020, advised patient that pharmacy will contact them the morning of delivery.

## 2020-04-23 ENCOUNTER — Other Ambulatory Visit: Payer: Self-pay | Admitting: Legal Medicine

## 2020-04-27 ENCOUNTER — Telehealth: Payer: Self-pay

## 2020-04-27 ENCOUNTER — Other Ambulatory Visit: Payer: Self-pay | Admitting: Legal Medicine

## 2020-04-27 NOTE — Progress Notes (Signed)
Spoke to patient to confirm how she is taking Ranexa.  Patient states she takes Ranexa 500 mg twice daily.  [place] Pharmacist Assistant [phone]

## 2020-04-28 ENCOUNTER — Telehealth: Payer: Self-pay

## 2020-04-28 NOTE — Progress Notes (Signed)
Left a voice message for patient to return my call to confirmed if she needs her Epipen filled.  [place] Pharmacist Assistant [phone]

## 2020-04-30 ENCOUNTER — Other Ambulatory Visit: Payer: Self-pay | Admitting: Legal Medicine

## 2020-04-30 DIAGNOSIS — E785 Hyperlipidemia, unspecified: Secondary | ICD-10-CM

## 2020-05-04 DIAGNOSIS — M545 Low back pain: Secondary | ICD-10-CM | POA: Diagnosis not present

## 2020-05-04 DIAGNOSIS — G8929 Other chronic pain: Secondary | ICD-10-CM | POA: Diagnosis not present

## 2020-05-04 DIAGNOSIS — Z79899 Other long term (current) drug therapy: Secondary | ICD-10-CM | POA: Diagnosis not present

## 2020-05-04 DIAGNOSIS — M419 Scoliosis, unspecified: Secondary | ICD-10-CM | POA: Diagnosis not present

## 2020-05-04 DIAGNOSIS — M5136 Other intervertebral disc degeneration, lumbar region: Secondary | ICD-10-CM | POA: Diagnosis not present

## 2020-05-11 DIAGNOSIS — I2510 Atherosclerotic heart disease of native coronary artery without angina pectoris: Secondary | ICD-10-CM | POA: Diagnosis not present

## 2020-05-11 DIAGNOSIS — J9611 Chronic respiratory failure with hypoxia: Secondary | ICD-10-CM | POA: Diagnosis not present

## 2020-05-11 DIAGNOSIS — I5022 Chronic systolic (congestive) heart failure: Secondary | ICD-10-CM | POA: Diagnosis not present

## 2020-05-12 DIAGNOSIS — J449 Chronic obstructive pulmonary disease, unspecified: Secondary | ICD-10-CM | POA: Diagnosis not present

## 2020-05-17 ENCOUNTER — Other Ambulatory Visit: Payer: Self-pay

## 2020-05-17 ENCOUNTER — Ambulatory Visit (INDEPENDENT_AMBULATORY_CARE_PROVIDER_SITE_OTHER): Payer: Medicare Other | Admitting: Legal Medicine

## 2020-05-17 ENCOUNTER — Encounter: Payer: Self-pay | Admitting: Legal Medicine

## 2020-05-17 VITALS — BP 80/60 | HR 80 | Temp 97.7°F | Resp 16 | Ht 60.0 in | Wt 132.2 lb

## 2020-05-17 DIAGNOSIS — I959 Hypotension, unspecified: Secondary | ICD-10-CM

## 2020-05-17 DIAGNOSIS — N3090 Cystitis, unspecified without hematuria: Secondary | ICD-10-CM

## 2020-05-17 DIAGNOSIS — N3001 Acute cystitis with hematuria: Secondary | ICD-10-CM | POA: Diagnosis not present

## 2020-05-17 DIAGNOSIS — R1115 Cyclical vomiting syndrome unrelated to migraine: Secondary | ICD-10-CM

## 2020-05-17 DIAGNOSIS — J441 Chronic obstructive pulmonary disease with (acute) exacerbation: Secondary | ICD-10-CM | POA: Diagnosis not present

## 2020-05-17 DIAGNOSIS — I9589 Other hypotension: Secondary | ICD-10-CM | POA: Diagnosis not present

## 2020-05-17 DIAGNOSIS — E861 Hypovolemia: Secondary | ICD-10-CM

## 2020-05-17 DIAGNOSIS — E860 Dehydration: Secondary | ICD-10-CM | POA: Diagnosis not present

## 2020-05-17 DIAGNOSIS — R197 Diarrhea, unspecified: Secondary | ICD-10-CM

## 2020-05-17 HISTORY — DX: Diarrhea, unspecified: R19.7

## 2020-05-17 HISTORY — DX: Hypotension, unspecified: I95.9

## 2020-05-17 HISTORY — DX: Cyclical vomiting syndrome unrelated to migraine: R11.15

## 2020-05-17 HISTORY — DX: Cystitis, unspecified without hematuria: N30.90

## 2020-05-17 HISTORY — DX: Dehydration: E86.0

## 2020-05-17 LAB — POC COVID19 BINAXNOW: SARS Coronavirus 2 Ag: NEGATIVE

## 2020-05-17 LAB — POCT URINALYSIS DIP (CLINITEK)
Bilirubin, UA: NEGATIVE
Glucose, UA: NEGATIVE mg/dL
Ketones, POC UA: NEGATIVE mg/dL
Nitrite, UA: NEGATIVE
Spec Grav, UA: >=1.03 — AB (ref 1.010–1.025)
Urobilinogen, UA: 0.2 E.U./dL
pH, UA: 5 (ref 5.0–8.0)

## 2020-05-17 MED ORDER — CEFTRIAXONE SODIUM 1 G IJ SOLR
1.0000 g | Freq: Once | INTRAMUSCULAR | Status: AC
  Administered 2020-05-17: 1 g via INTRAMUSCULAR

## 2020-05-17 MED ORDER — ONDANSETRON HCL 4 MG PO TABS: 4.0000 mg | ORAL_TABLET | Freq: Three times a day (TID) | ORAL | 0 refills | Status: DC | PRN

## 2020-05-17 MED ORDER — NITROFURANTOIN MONOHYD MACRO 100 MG PO CAPS: 100.0000 mg | ORAL_CAPSULE | Freq: Two times a day (BID) | ORAL | 2 refills | Status: DC

## 2020-05-17 NOTE — Progress Notes (Signed)
Subjective:  Patient ID: [name], female    DOB: Sep 07, 1955  Age: 64 y.o. MRN: [id number]  Chief Complaint  Patient presents with  . Emesis  . Diarrhea    HPI: has chronic nausea and vomiting with diarrhea off and on for one month.  Weight down 5 lbs.  She is anorectic. No cramping pains.  No fever or chills.  BP is low systolic 16/07,  She is weak and does not want to move. She is trying to drink more fluids.   Current Outpatient Medications on File Prior to Visit  Medication Sig Dispense Refill  . aspirin 81 MG tablet Take 81 mg by mouth daily.      Marland Kitchen BREO ELLIPTA 100-25 MCG/INH AEPB INHALE ONE PUFF INTO THE LUNG ONCE DAILY 28 each 4  . Buprenorphine HCl (BELBUCA) 900 MCG FILM Place 900 mcg inside cheek every 12 (twelve) hours.    . carvedilol (COREG) 3.125 MG tablet Take 1 tablet (3.125 mg total) by mouth 2 (two) times daily. 180 tablet 2  . celecoxib (CELEBREX) 200 MG capsule Take 1 capsule (200 mg total) by mouth in the morning. 90 capsule 1  . clobetasol ointment (TEMOVATE) 0.05 % Apply topically.    . cyclobenzaprine (FLEXERIL) 10 MG tablet Take 10 mg by mouth 3 (three) times daily as needed for muscle spasms.    Marland Kitchen denosumab (PROLIA) 60 MG/ML SOSY injection Inject 60 mg into the skin every 6 (six) months. 180 mL 2  . diazepam (VALIUM) 5 MG tablet TAKE ONE TABLET BY MOUTH THREE times A DAY AS NEEDED 30 tablet 2  . EPINEPHRINE 0.3 mg/0.3 mL IJ SOAJ injection Inject 0.3 mLs (0.3 mg total) into the muscle as needed for anaphylaxis. 1 each 2  . esomeprazole (NEXIUM) 20 MG capsule TAKE 1 CAPSULE BY MOUTH DAILY 90 capsule 2  . fluticasone (FLONASE) 50 MCG/ACT nasal spray Place 2 sprays into both nostrils daily as needed (congestion). 18 mL 2  . furosemide (LASIX) 40 MG tablet Take 1 tablet (40 mg total) by mouth daily. Take 1 tablet daily. If you gain 3 pounds in 1 day or 5 pounds in 2 days, take 0.5 tablet (20 mg) in the afternoon. 90 tablet 1  . gabapentin (NEURONTIN) 800 MG  tablet Take 1 tablet (800 mg total) by mouth 3 (three) times daily. Patient usually takes twice a day 270 tablet 2  . ipratropium (ATROVENT) 0.02 % nebulizer solution Take 2.5 mLs (0.5 mg total) by nebulization every 4 (four) hours as needed for wheezing or shortness of breath. 150 mL 2  . Ketoprofen 25 MG CAPS Take by mouth.    . levocetirizine (XYZAL) 5 MG tablet Take 5 mg by mouth daily as needed.    . Multiple Vitamins-Minerals (AIRBORNE GUMMIES PO) Take 3 tablets by mouth daily in the afternoon.    . nitroGLYCERIN (NITROSTAT) 0.4 MG SL tablet Place 1 tablet (0.4 mg total) under the tongue every 5 (five) minutes as needed for chest pain. 25 tablet 6  . Oxycodone HCl 10 MG TABS Take 1 tablet by mouth every 4 (four) hours as needed.    . ranolazine (RANEXA) 500 MG 12 hr tablet Take 1 tablet (500 mg total) by mouth 2 (two) times daily. 180 tablet 2  . rosuvastatin (CRESTOR) 20 MG tablet TAKE 1 TABLET(20 MG) BY MOUTH DAILY 90 tablet 2  . sacubitril-valsartan (ENTRESTO) 24-26 MG Take 1 tablet by mouth 2 (two) times daily. 180 tablet 1  .  sertraline (ZOLOFT) 50 MG tablet TAKE 1 TABLET BY MOUTH DAILY 90 tablet 2  . tiZANidine (ZANAFLEX) 4 MG tablet Take 4 mg by mouth at bedtime.    . topiramate (TOPAMAX) 50 MG tablet Take 2 tablets (100 mg total) by mouth at bedtime. 240 tablet 2  . Ubrogepant (UBRELVY) 100 MG TABS Take 1 tablet by mouth as needed (May repeat 1 tablet after 2 hours if needed.  Maximum 2 tablets in 24 hours). 16 tablet 6   No current facility-administered medications on file prior to visit.   Past Medical History:  Diagnosis Date  . Admission for long-term opiate analgesic use 10/24/2019  . Arthritis of right hip 05/29/2016   Formatting of this note might be different from the original. Added automatically from request for surgery [phone]  . Cardiomyopathy [facility])    Overview:  Ejection fraction 45% in 2015 Ejection fraction 30 to 35% in November 2018  . Chronic back pain   . Chronic  hypoxemic respiratory failure ([place]) 10/24/2019  . Chronic narcotic use 03/23/2015  . Chronic pain of both knees 12/21/2018   Added automatically from request for surgery [id number]  Formatting of this note might be different from the original. Added automatically from request for surgery [phone]  . Chronic systolic congestive heart failure, NYHA class 2 ([place]) 06/19/2017  . COPD (chronic obstructive pulmonary disease) ([place])   . Coronary artery disease involving native coronary artery of native heart without angina pectoris 05/31/2015   Overview:  Abnormal stress test in fall of 2016, cardiac catheterization showed normal coronaries.  . Dilated cardiomyopathy ([place]) 06/19/2017  . 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 Dyslipidemia 05/31/2015  . Essential hypertension 05/31/2015  . GERD (gastroesophageal reflux disease)   . ICD (implantable cardiac defibrillator) in place 2012  . Ileus following gastrointestinal surgery ([place]) 03/26/2015  . Major depressive disorder, single episode, moderate ([place]) 10/24/2019  . Migraine without aura with status migrainosus 10/24/2019  . Nausea alone 04/19/2014  . Obstructive chronic bronchitis with exacerbation ([place]) 10/25/2019  . Other spondylosis with radiculopathy, lumbar region 10/24/2019  . Presence of left artificial hip joint 10/24/2019  . PTSD (post-traumatic stress disorder)   . 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 Senile osteoporosis 10/24/2019  . Thyroid disease    Past Surgical History:  Procedure Laterality Date  . APPENDECTOMY    . CARDIAC CATHETERIZATION    . CARPAL TUNNEL RELEASE    . CESAREAN SECTION    . CHOLECYSTECTOMY    . CORONARY ANGIOPLASTY    . HAND SURGERY    . ICD GENERATOR CHANGEOUT N/A 05/21/2019   Procedure: ICD GENERATOR CHANGEOUT;  Surgeon: Constance Haw, MD;  Location: [facility];  Service:  Cardiovascular;  Laterality: N/A;  . ICD IMPLANT     Medtronic  . intestinal blockage 2011    . LAPAROSCOPIC ASSISTED VENTRAL HERNIA REPAIR N/A 03/23/2015   Procedure: LAPAROSCOPIC VENTRAL WALL HERNIA REPAIR;  Surgeon: Michael Boston, MD;  Location: [facility];  Service: General;  Laterality: N/A;  With MESH  . LAPAROSCOPIC LYSIS OF ADHESIONS N/A 03/23/2015   Procedure: LAPAROSCOPIC LYSIS OF ADHESIONS;  Surgeon: Michael Boston, MD;  Location: [facility];  Service: General;  Laterality: N/A;  . NECK SURGERY     fused  . TONSILLECTOMY    . ULNAR NERVE TRANSPOSITION  01/23/2012   Procedure: ULNAR NERVE DECOMPRESSION/TRANSPOSITION;  Surgeon: Ophelia Charter, MD;  Location: [facility];  Service: Neurosurgery;  Laterality: Left;  LEFT ulnar nerve decompression    Family History  Problem Relation Age of Onset  . Heart attack Other   . Cancer Other   . Heart failure Other   . Hypertension Mother   . Heart attack Mother   . Alcohol abuse Father   . Hypertension Father   . Heart attack Father   . Anesthesia problems Neg Hx   . Hypotension Neg Hx   . Malignant hyperthermia Neg Hx   . Pseudochol deficiency Neg Hx    Social History   Socioeconomic History  . Marital status: Single    Spouse name: Not on file  . Number of children: 3  . Years of education: Not on file  . Highest education level: Not on file  Occupational History  . Occupation: disabled  Tobacco Use  . Smoking status: Current Every Day Smoker    Packs/day: 0.50    Years: 30.00    Pack years: 15.00    Types: Cigarettes    Last attempt to quit: 08/11/2010    Years since quitting: 9.7  . Smokeless tobacco: Never Used  Vaping Use  . Vaping Use: Never used  Substance and Sexual Activity  . Alcohol use: Yes    Alcohol/week: 2.0 standard drinks    Types: 1 Cans of beer, 1 Standard drinks or equivalent per week    Comment: seldom  . Drug use: No  . Sexual activity: Not Currently  Other Topics Concern  . Not on file  Social  History Narrative   One level home with boyfriend   Caffeine - coffee 1-2 cups/day; Green tea 4-5 bottles a day   Exercise - some    Right handed      Social Determinants of Health   Financial Resource Strain:   . Difficulty of Paying Living Expenses: Not on file  Food Insecurity:   . Worried About Charity fundraiser in the Last Year: Not on file  . Ran Out of Food in the Last Year: Not on file  Transportation Needs:   . Lack of Transportation (Medical): Not on file  . Lack of Transportation (Non-Medical): Not on file  Physical Activity:   . Days of Exercise per Week: Not on file  . Minutes of Exercise per Session: Not on file  Stress:   . Feeling of Stress : Not on file  Social Connections:   . Frequency of Communication with Friends and Family: Not on file  . Frequency of Social Gatherings with Friends and Family: Not on file  . Attends Religious Services: Not on file  . Active Member of Clubs or Organizations: Not on file  . Attends Archivist Meetings: Not on file  . Marital Status: Not on file    Review of Systems  Constitutional: Negative.   HENT: Negative.   Eyes: Negative.   Respiratory: Negative for cough.   Cardiovascular: Negative.  Negative for chest pain and palpitations.  Gastrointestinal: Positive for diarrhea, nausea and vomiting.  Genitourinary: Negative.   Musculoskeletal: Negative.   Skin: Negative.   Neurological: Negative.   Psychiatric/Behavioral: Negative.      Objective:  BP (!) 80/60   Pulse 80   Temp 97.7 F (36.5 C)   Resp 16   Ht 5' (1.524 m)   Wt 132 lb 3.2 oz (60 kg)   BMI 25.82 kg/m   BP/Weight 05/17/2020 1/94/1740 03/26/4480  Systolic BP 80 856 90  Diastolic BP 60 70 56  Wt. (Lbs) 132.2 137.4 137.2  BMI 25.82 26.83 26.8    Physical Exam Constitutional:      Appearance: She is underweight. She is ill-appearing.  HENT:     Right Ear: Tympanic membrane, ear canal and external ear normal.     Left Ear: Tympanic  membrane, ear canal and external ear normal.     Nose: Nose normal.     Mouth/Throat:     Mouth: Mucous membranes are moist.  Eyes:     Extraocular Movements: Extraocular movements intact.     Conjunctiva/sclera: Conjunctivae normal.     Pupils: Pupils are equal, round, and reactive to light.  Cardiovascular:     Rate and Rhythm: Normal rate and regular rhythm.     Pulses: Normal pulses.     Heart sounds: Normal heart sounds.  Pulmonary:     Effort: Pulmonary effort is normal.     Breath sounds: Normal breath sounds.  Abdominal:     General: Abdomen is flat.     Palpations: Abdomen is soft.  Musculoskeletal:     Cervical back: Normal range of motion and neck supple.     Comments: Muscle wasting  Skin:    General: Skin is warm and dry.     Capillary Refill: Capillary refill takes less than 2 seconds.  Neurological:     General: No focal deficit present.     Mental Status: She is alert and oriented to person, place, and time. Mental status is at baseline.  Psychiatric:        Mood and Affect: Mood normal.        Behavior: Behavior is cooperative.        Thought Content: Thought content normal.       Lab Results  Component Value Date   WBC 9.3 12/20/2019   HGB 11.7 12/20/2019   HCT 36.1 12/20/2019   PLT 352 12/20/2019   GLUCOSE 164 (H) 02/15/2020   CHOL 164 12/20/2019   TRIG 110 12/20/2019   HDL 61 12/20/2019   LDLCALC 83 12/20/2019   ALT 5 02/15/2020   AST 18 02/15/2020   NA 142 02/15/2020   K 4.4 02/15/2020   CL 101 02/15/2020   CREATININE 1.12 (H) 02/15/2020   BUN 18 02/15/2020   CO2 26 02/15/2020   TSH 0.882 02/15/2020      Assessment & Plan:   1. Dehydration Patient has lost 5 lbs in last month and unable to keep food down.  2. Hypotension due to hypovolemia Patient has low BP.  She refuses to go to ER for rehydration.  3. Persistent vomiting Patient has been vomiting all month  4. Diarrhea, unspecified type Patient having persistent  Loose  BM.  Cystitis: Patient has pyurinia and will be cultured.  Given IM rocephin 1gm, zofran for nausea and PO macrobid.  Follow up 2 days.    Meds ordered this encounter  Medications  . cefTRIAXone (ROCEPHIN) injection 1 g  . ondansetron (ZOFRAN) 4 MG tablet    Sig: Take 1 tablet (4 mg total) by mouth every 8 (eight) hours as needed for nausea or vomiting.    Dispense:  20 tablet    Refill:  0  . nitrofurantoin, macrocrystal-monohydrate, (MACROBID) 100 MG capsule    Sig: Take 1 capsule (100 mg total) by mouth 2 (two) times daily.    Dispense:  14 capsule    Refill:  2    Orders Placed This Encounter  Procedures  .  Urine Culture  . POCT URINALYSIS DIP (CLINITEK)  . POC COVID-19     Follow-up: Return in about 2 days (around 05/19/2020) for dehydration.  An After Visit Summary was printed and given to the patient.  [place] [phone]

## 2020-05-19 ENCOUNTER — Other Ambulatory Visit: Payer: Self-pay

## 2020-05-19 ENCOUNTER — Encounter: Payer: Self-pay | Admitting: Legal Medicine

## 2020-05-19 ENCOUNTER — Ambulatory Visit (INDEPENDENT_AMBULATORY_CARE_PROVIDER_SITE_OTHER): Payer: Medicare Other | Admitting: Legal Medicine

## 2020-05-19 VITALS — BP 90/60 | HR 70 | Temp 98.1°F | Resp 17 | Ht 60.0 in | Wt 131.0 lb

## 2020-05-19 DIAGNOSIS — I9589 Other hypotension: Secondary | ICD-10-CM | POA: Diagnosis not present

## 2020-05-19 DIAGNOSIS — R197 Diarrhea, unspecified: Secondary | ICD-10-CM | POA: Diagnosis not present

## 2020-05-19 DIAGNOSIS — E860 Dehydration: Secondary | ICD-10-CM | POA: Diagnosis not present

## 2020-05-19 DIAGNOSIS — E861 Hypovolemia: Secondary | ICD-10-CM | POA: Diagnosis not present

## 2020-05-19 MED ORDER — DIPHENOXYLATE-ATROPINE 2.5-0.025 MG PO TABS: 1.0000 | ORAL_TABLET | Freq: Four times a day (QID) | ORAL | 0 refills | Status: DC | PRN

## 2020-05-19 NOTE — Progress Notes (Signed)
Subjective:  Patient ID: [name], female    DOB: 03/30/1956  Age: 64 y.o. MRN: [id number]  Chief Complaint  Patient presents with  . Diarrhea    HPI: she is keeping fliuds down ok and she is better hydrated.  She is eating bland foots. Still having loose stools.  She is less ill and feeling improved.   Current Outpatient Medications on File Prior to Visit  Medication Sig Dispense Refill  . aspirin 81 MG tablet Take 81 mg by mouth daily.      Marland Kitchen BREO ELLIPTA 100-25 MCG/INH AEPB INHALE ONE PUFF INTO THE LUNG ONCE DAILY 28 each 4  . Buprenorphine HCl (BELBUCA) 900 MCG FILM Place 900 mcg inside cheek every 12 (twelve) hours.    . carvedilol (COREG) 3.125 MG tablet Take 1 tablet (3.125 mg total) by mouth 2 (two) times daily. 180 tablet 2  . celecoxib (CELEBREX) 200 MG capsule Take 1 capsule (200 mg total) by mouth in the morning. 90 capsule 1  . clobetasol ointment (TEMOVATE) 0.05 % Apply topically.    . cyclobenzaprine (FLEXERIL) 10 MG tablet Take 10 mg by mouth 3 (three) times daily as needed for muscle spasms.    Marland Kitchen denosumab (PROLIA) 60 MG/ML SOSY injection Inject 60 mg into the skin every 6 (six) months. 180 mL 2  . diazepam (VALIUM) 5 MG tablet TAKE ONE TABLET BY MOUTH THREE times A DAY AS NEEDED 30 tablet 2  . EPINEPHRINE 0.3 mg/0.3 mL IJ SOAJ injection Inject 0.3 mLs (0.3 mg total) into the muscle as needed for anaphylaxis. 1 each 2  . esomeprazole (NEXIUM) 20 MG capsule TAKE 1 CAPSULE BY MOUTH DAILY 90 capsule 2  . fluticasone (FLONASE) 50 MCG/ACT nasal spray Place 2 sprays into both nostrils daily as needed (congestion). 18 mL 2  . furosemide (LASIX) 40 MG tablet Take 1 tablet (40 mg total) by mouth daily. Take 1 tablet daily. If you gain 3 pounds in 1 day or 5 pounds in 2 days, take 0.5 tablet (20 mg) in the afternoon. 90 tablet 1  . gabapentin (NEURONTIN) 800 MG tablet Take 1 tablet (800 mg total) by mouth 3 (three) times daily. Patient usually takes twice a day 270 tablet 2    . ipratropium (ATROVENT) 0.02 % nebulizer solution Take 2.5 mLs (0.5 mg total) by nebulization every 4 (four) hours as needed for wheezing or shortness of breath. 150 mL 2  . Ketoprofen 25 MG CAPS Take by mouth.    . levocetirizine (XYZAL) 5 MG tablet Take 5 mg by mouth daily as needed.    . Multiple Vitamins-Minerals (AIRBORNE GUMMIES PO) Take 3 tablets by mouth daily in the afternoon.    . nitrofurantoin, macrocrystal-monohydrate, (MACROBID) 100 MG capsule Take 1 capsule (100 mg total) by mouth 2 (two) times daily. 14 capsule 2  . nitroGLYCERIN (NITROSTAT) 0.4 MG SL tablet Place 1 tablet (0.4 mg total) under the tongue every 5 (five) minutes as needed for chest pain. 25 tablet 6  . ondansetron (ZOFRAN) 4 MG tablet Take 1 tablet (4 mg total) by mouth every 8 (eight) hours as needed for nausea or vomiting. 20 tablet 0  . Oxycodone HCl 10 MG TABS Take 1 tablet by mouth every 4 (four) hours as needed.    . ranolazine (RANEXA) 500 MG 12 hr tablet Take 1 tablet (500 mg total) by mouth 2 (two) times daily. 180 tablet 2  . rosuvastatin (CRESTOR) 20 MG tablet TAKE 1 TABLET(20 MG) BY  MOUTH DAILY 90 tablet 2  . sacubitril-valsartan (ENTRESTO) 24-26 MG Take 1 tablet by mouth 2 (two) times daily. 180 tablet 1  . sertraline (ZOLOFT) 50 MG tablet TAKE 1 TABLET BY MOUTH DAILY 90 tablet 2  . tiZANidine (ZANAFLEX) 4 MG tablet Take 4 mg by mouth at bedtime.    . topiramate (TOPAMAX) 50 MG tablet Take 2 tablets (100 mg total) by mouth at bedtime. 240 tablet 2  . Ubrogepant (UBRELVY) 100 MG TABS Take 1 tablet by mouth as needed (May repeat 1 tablet after 2 hours if needed.  Maximum 2 tablets in 24 hours). 16 tablet 6   No current facility-administered medications on file prior to visit.   Past Medical History:  Diagnosis Date  . Admission for long-term opiate analgesic use 10/24/2019  . Arthritis of right hip 05/29/2016   Formatting of this note might be different from the original. Added automatically from  request for surgery [phone]  . Cardiomyopathy [facility])    Overview:  Ejection fraction 45% in 2015 Ejection fraction 30 to 35% in November 2018  . Chronic back pain   . Chronic hypoxemic respiratory failure ([place]) 10/24/2019  . Chronic narcotic use 03/23/2015  . Chronic pain of both knees 12/21/2018   Added automatically from request for surgery [id number]  Formatting of this note might be different from the original. Added automatically from request for surgery [phone]  . Chronic systolic congestive heart failure, NYHA class 2 ([place]) 06/19/2017  . COPD (chronic obstructive pulmonary disease) ([place])   . Coronary artery disease involving native coronary artery of native heart without angina pectoris 05/31/2015   Overview:  Abnormal stress test in fall of 2016, cardiac catheterization showed normal coronaries.  . Dilated cardiomyopathy ([place]) 06/19/2017  . 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 Dyslipidemia 05/31/2015  . Essential hypertension 05/31/2015  . GERD (gastroesophageal reflux disease)   . ICD (implantable cardiac defibrillator) in place 2012  . Ileus following gastrointestinal surgery ([place]) 03/26/2015  . Major depressive disorder, single episode, moderate ([place]) 10/24/2019  . Migraine without aura with status migrainosus 10/24/2019  . Nausea alone 04/19/2014  . Obstructive chronic bronchitis with exacerbation ([place]) 10/25/2019  . Other spondylosis with radiculopathy, lumbar region 10/24/2019  . Presence of left artificial hip joint 10/24/2019  . PTSD (post-traumatic stress disorder)   . 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 Senile osteoporosis 10/24/2019  . Thyroid disease    Past Surgical History:  Procedure Laterality Date  . APPENDECTOMY    . CARDIAC CATHETERIZATION    . CARPAL TUNNEL RELEASE    . CESAREAN SECTION    . CHOLECYSTECTOMY    . CORONARY ANGIOPLASTY    . HAND  SURGERY    . ICD GENERATOR CHANGEOUT N/A 05/21/2019   Procedure: ICD GENERATOR CHANGEOUT;  Surgeon: Constance Haw, MD;  Location: [facility];  Service: Cardiovascular;  Laterality: N/A;  . ICD IMPLANT     Medtronic  . intestinal blockage 2011    . LAPAROSCOPIC ASSISTED VENTRAL HERNIA REPAIR N/A 03/23/2015   Procedure: LAPAROSCOPIC VENTRAL WALL HERNIA REPAIR;  Surgeon: Michael Boston, MD;  Location: [facility];  Service: General;  Laterality: N/A;  With MESH  . LAPAROSCOPIC LYSIS OF ADHESIONS N/A 03/23/2015   Procedure: LAPAROSCOPIC LYSIS OF ADHESIONS;  Surgeon: Michael Boston, MD;  Location[facility];  Service: General;  Laterality: N/A;  . NECK SURGERY  fused  . TONSILLECTOMY    . ULNAR NERVE TRANSPOSITION  01/23/2012   Procedure: ULNAR NERVE DECOMPRESSION/TRANSPOSITION;  Surgeon: Ophelia Charter, MD;  Location: [facility];  Service: Neurosurgery;  Laterality: Left;  LEFT ulnar nerve decompression    Family History  Problem Relation Age of Onset  . Heart attack Other   . Cancer Other   . Heart failure Other   . Hypertension Mother   . Heart attack Mother   . Alcohol abuse Father   . Hypertension Father   . Heart attack Father   . Anesthesia problems Neg Hx   . Hypotension Neg Hx   . Malignant hyperthermia Neg Hx   . Pseudochol deficiency Neg Hx    Social History   Socioeconomic History  . Marital status: Single    Spouse name: Not on file  . Number of children: 3  . Years of education: Not on file  . Highest education level: Not on file  Occupational History  . Occupation: disabled  Tobacco Use  . Smoking status: Current Every Day Smoker    Packs/day: 0.50    Years: 30.00    Pack years: 15.00    Types: Cigarettes    Last attempt to quit: 08/11/2010    Years since quitting: 9.7  . Smokeless tobacco: Never Used  Vaping Use  . Vaping Use: Never used  Substance and Sexual Activity  . Alcohol use: Yes    Alcohol/week: 2.0 standard drinks    Types: 1 Cans of  beer, 1 Standard drinks or equivalent per week    Comment: seldom  . Drug use: No  . Sexual activity: Not Currently  Other Topics Concern  . Not on file  Social History Narrative   One level home with boyfriend   Caffeine - coffee 1-2 cups/day; Green tea 4-5 bottles a day   Exercise - some    Right handed      Social Determinants of Health   Financial Resource Strain:   . Difficulty of Paying Living Expenses: Not on file  Food Insecurity:   . Worried About Charity fundraiser in the Last Year: Not on file  . Ran Out of Food in the Last Year: Not on file  Transportation Needs:   . Lack of Transportation (Medical): Not on file  . Lack of Transportation (Non-Medical): Not on file  Physical Activity:   . Days of Exercise per Week: Not on file  . Minutes of Exercise per Session: Not on file  Stress:   . Feeling of Stress : Not on file  Social Connections:   . Frequency of Communication with Friends and Family: Not on file  . Frequency of Social Gatherings with Friends and Family: Not on file  . Attends Religious Services: Not on file  . Active Member of Clubs or Organizations: Not on file  . Attends Archivist Meetings: Not on file  . Marital Status: Not on file    Review of Systems  Constitutional: Negative.   HENT: Negative.   Eyes: Negative.   Cardiovascular: Negative for chest pain, palpitations and leg swelling.  Gastrointestinal: Positive for diarrhea.  Genitourinary: Negative.   Musculoskeletal: Negative.   Skin: Negative.   Psychiatric/Behavioral: Negative.      Objective:  BP 90/60   Pulse 70   Temp 98.1 F (36.7 C)   Resp 17   Ht 5' (1.524 m)   Wt 131 lb (59.4 kg)   BMI 25.58 kg/m  BP/Weight 05/19/2020 05/17/2020 2/48/2500  Systolic BP 90 80 370  Diastolic BP 60 60 70  Wt. (Lbs) 131 132.2 137.4  BMI 25.58 25.82 26.83    Physical Exam Vitals reviewed.  Constitutional:      Appearance: Normal appearance.  Cardiovascular:     Rate  and Rhythm: Normal rate and regular rhythm.     Pulses: Normal pulses.     Heart sounds: Normal heart sounds.  Pulmonary:     Effort: Pulmonary effort is normal.     Breath sounds: Normal breath sounds.  Abdominal:     Tenderness: There is no abdominal tenderness. There is no guarding.  Musculoskeletal:        General: Normal range of motion.     Cervical back: Normal range of motion and neck supple.  Skin:    General: Skin is warm.  Neurological:     Mental Status: She is alert and oriented to person, place, and time.       Lab Results  Component Value Date   WBC 9.3 12/20/2019   HGB 11.7 12/20/2019   HCT 36.1 12/20/2019   PLT 352 12/20/2019   GLUCOSE 164 (H) 02/15/2020   CHOL 164 12/20/2019   TRIG 110 12/20/2019   HDL 61 12/20/2019   LDLCALC 83 12/20/2019   ALT 5 02/15/2020   AST 18 02/15/2020   NA 142 02/15/2020   K 4.4 02/15/2020   CL 101 02/15/2020   CREATININE 1.12 (H) 02/15/2020   BUN 18 02/15/2020   CO2 26 02/15/2020   TSH 0.882 02/15/2020      Assessment & Plan:   1. Dehydration Patient is drinking fluids better and no vomiting, diarrhea is lessened.  She does not want to go to ER.  2. Hypotension due to hypovolemia Bp is now 90 systolic   3. Diarrhea, unspecified type Less diarrhea and improving each day.         Follow-up: Return in about 1 week (around 05/26/2020) for diarrhea.  An After Visit Summary was printed and given to the patient.  [place] [phone]

## 2020-05-21 ENCOUNTER — Encounter: Payer: Self-pay | Admitting: Legal Medicine

## 2020-05-21 LAB — URINE CULTURE

## 2020-05-22 ENCOUNTER — Other Ambulatory Visit: Payer: Self-pay | Admitting: Legal Medicine

## 2020-05-22 NOTE — Progress Notes (Signed)
Culture shows E.Coli sensitive to  macrobid lp

## 2020-05-24 ENCOUNTER — Telehealth: Payer: Self-pay

## 2020-05-24 NOTE — Chronic Care Management (AMB) (Signed)
Chronic Care Management Pharmacy Assistant   Name: [name]  MRN: [id number] DOB: 1955-12-29  Reason for Encounter: Medication Review   PCP : Lillard Anes, MD  Allergies:  No Known Allergies  Medications: Outpatient Encounter Medications as of 05/24/2020  Medication Sig  . aspirin 81 MG tablet Take 81 mg by mouth daily.    Marland Kitchen BREO ELLIPTA 100-25 MCG/INH AEPB INHALE ONE PUFF INTO THE LUNG ONCE DAILY  . Buprenorphine HCl (BELBUCA) 900 MCG FILM Place 900 mcg inside cheek every 12 (twelve) hours.  . carvedilol (COREG) 3.125 MG tablet Take 1 tablet (3.125 mg total) by mouth 2 (two) times daily.  . celecoxib (CELEBREX) 200 MG capsule Take 1 capsule (200 mg total) by mouth in the morning.  . clobetasol ointment (TEMOVATE) 0.05 % Apply topically.  . cyclobenzaprine (FLEXERIL) 10 MG tablet Take 10 mg by mouth 3 (three) times daily as needed for muscle spasms.  Marland Kitchen denosumab (PROLIA) 60 MG/ML SOSY injection Inject 60 mg into the skin every 6 (six) months.  . diazepam (VALIUM) 5 MG tablet TAKE ONE TABLET BY MOUTH THREE times A DAY AS NEEDED  . diphenoxylate-atropine (LOMOTIL) 2.5-0.025 MG tablet Take 1 tablet by mouth 4 (four) times daily as needed for diarrhea or loose stools.  Marland Kitchen EPINEPHRINE 0.3 mg/0.3 mL IJ SOAJ injection Inject 0.3 mLs (0.3 mg total) into the muscle as needed for anaphylaxis.  Marland Kitchen esomeprazole (NEXIUM) 20 MG capsule TAKE 1 CAPSULE BY MOUTH DAILY  . fluticasone (FLONASE) 50 MCG/ACT nasal spray Place 2 sprays into both nostrils daily as needed (congestion).  . furosemide (LASIX) 40 MG tablet Take 1 tablet (40 mg total) by mouth daily. Take 1 tablet daily. If you gain 3 pounds in 1 day or 5 pounds in 2 days, take 0.5 tablet (20 mg) in the afternoon.  . gabapentin (NEURONTIN) 800 MG tablet Take 1 tablet (800 mg total) by mouth 3 (three) times daily. Patient usually takes twice a day  . ipratropium (ATROVENT) 0.02 % nebulizer solution Take 2.5 mLs (0.5 mg total) by  nebulization every 4 (four) hours as needed for wheezing or shortness of breath.  . Ketoprofen 25 MG CAPS Take by mouth.  . levocetirizine (XYZAL) 5 MG tablet Take 5 mg by mouth daily as needed.  . Multiple Vitamins-Minerals (AIRBORNE GUMMIES PO) Take 3 tablets by mouth daily in the afternoon.  . nitrofurantoin, macrocrystal-monohydrate, (MACROBID) 100 MG capsule Take 1 capsule (100 mg total) by mouth 2 (two) times daily.  . nitroGLYCERIN (NITROSTAT) 0.4 MG SL tablet Place 1 tablet (0.4 mg total) under the tongue every 5 (five) minutes as needed for chest pain.  Marland Kitchen ondansetron (ZOFRAN) 4 MG tablet Take 1 tablet (4 mg total) by mouth every 8 (eight) hours as needed for nausea or vomiting.  . Oxycodone HCl 10 MG TABS Take 1 tablet by mouth every 4 (four) hours as needed.  . ranolazine (RANEXA) 500 MG 12 hr tablet Take 1 tablet (500 mg total) by mouth 2 (two) times daily.  . rosuvastatin (CRESTOR) 20 MG tablet TAKE 1 TABLET(20 MG) BY MOUTH DAILY  . sacubitril-valsartan (ENTRESTO) 24-26 MG Take 1 tablet by mouth 2 (two) times daily.  . sertraline (ZOLOFT) 50 MG tablet TAKE 1 TABLET BY MOUTH DAILY  . tiZANidine (ZANAFLEX) 4 MG tablet Take 4 mg by mouth at bedtime.  . topiramate (TOPAMAX) 50 MG tablet Take 2 tablets (100 mg total) by mouth at bedtime.  Marland Kitchen Ubrogepant (UBRELVY) 100 MG TABS  Take 1 tablet by mouth as needed (May repeat 1 tablet after 2 hours if needed.  Maximum 2 tablets in 24 hours).   No facility-administered encounter medications on file as of 05/24/2020.    Current Diagnosis: Patient Active Problem List   Diagnosis Date Noted  . Dehydration 05/17/2020  . Hypotension 05/17/2020  . Persistent vomiting 05/17/2020  . Diarrhea 05/17/2020  . Cystitis 05/17/2020  . BMI 26.0-26.9,adult 03/29/2020  . Obstructive chronic bronchitis with exacerbation ([place]) 10/25/2019  . Admission for long-term opiate analgesic use 10/24/2019  . Presence of left artificial hip joint 10/24/2019  . Senile  osteoporosis 10/24/2019  . Chronic hypoxemic respiratory failure ([place]) 10/24/2019  . Other spondylosis with radiculopathy, lumbar region 10/24/2019  . Major depressive disorder, single episode, moderate ([place]) 10/24/2019  . Migraine without aura with status migrainosus 10/24/2019  . 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 Chronic pain of both knees 12/21/2018  . Chronic systolic congestive heart failure, NYHA class 2 ([place]) 06/19/2017  . Dilated cardiomyopathy ([place]) 06/19/2017  . 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 Arthritis of right hip 05/29/2016  . Coronary artery disease involving native coronary artery of native heart without angina pectoris 05/31/2015  . Dyslipidemia 05/31/2015  . Essential hypertension 05/31/2015  . Ileus following gastrointestinal surgery ([place]) 03/26/2015  . Chronic narcotic use 03/23/2015  . GERD (gastroesophageal reflux disease)   . 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 COPD (chronic obstructive pulmonary disease) ([place])   . Cardiomyopathy ([place])   . Chronic back pain    Reviewed chart for medication changes ahead of medication coordination call.  OV- 05/17/20, Dr.Perry (PCP). 05/19/20, Dr. Henrene Pastor PCP)  visits since last care coordination call/Pharmacist visit.    Medication changes indicated - During office visit 05/17/20 PCP, Dr. Henrene Pastor modified Cyclobenzaprine from 7.5 MG tablets oral to 10 MG oral three times daily, PRN for muscle spasms, Added Ondansetron take one 4 MG  tablet by mouth every 8 hours as needed for nausea and vomiting, Added NitrofurantoinTake 1 capsule (100 mg total) by mouth 2 (two) times daily. Dr. Henrene Pastor stopped these medications, Alendronate Sodium 70 MG, Linaclotide 145 MCG, Oxycodone Acetaminophen 10-325 MG. During last OV 05/19/20, Dr. Henrene Pastor added Diphenoxylate-Atropine 2.5-0.025 MG 1 tablet oral 4 times daily, PRN.    BP Readings from Last 3 Encounters:    05/19/20 90/60  05/17/20 (!) 80/60  04/05/20 108/70    No results found for: HGBA1C   Patient obtains medications through Adherence Packaging  30 Days   Last adherence delivery included: Carvedilol 3.125 mg BID - 1 breakfast, 1 EM, Celecoxib 200 mg - 1 breakfast, Diazepam 5mg  - TID PRN ( VIALS), Nexium 20 mg - 1 BB, Breo- Ellipta, Furosemide 40 mg - 1 am and  tablet afternoon as needed. Please put am dose in packaging and give 15 tablets in bottle, Gabapentin 800 mg  - 1 breakfast, 1 EM, Roselyn Meier 100mg  - PRN - (Vials), Topiramate 100mg  - 1 bedtime, Xyzal 5 mg - daily PRN (VIALS), Linzess 145 mcg - 1 Breakfast, Entresto 24-26mg  - 1 breakfast, 1 EM, Ranexa 500 mg - 1 breakfast, Sertraline 500 mg - 1 breakfast, Rosuvastatin 20mg  - 1 breakfast Epi Pen PRN- NEEDS ON THIS FILL  Patient declined Flonase - PRN - doesn't need - will call when needed, Ipratropium- bromide nebulizer solution - PRN - will call when needed, Combivent - PRN - will let us know when needed, Nitroglycerin - PRN - will  let us know when needed, Alendronate 70mg  -Not taking this one anymore, goes into office for shot now.  last month due to PRN use/additional supply on hand.  Patient is due for next adherence delivery on: 05/31/20. Called patient and reviewed medications and coordinated delivery.  This delivery to include:Carvedilol 3.125 Mg Celecoxib 200 mg - 1 breakfast, Diazepam 5mg  - TID PRN (VIALS), Nexium 20 mg - 1 BB, Furosemide 40 mg - 1 am and  tablet afternoon as needed. Please put am dose in packaging and give 15 tablets in bottle, Gabapentin 800 mg - 1 breakfast, 1 EM, Topiramate 100mg  - 1 bedtime, Entresto 24-26mg  - 1 breakfast, 1 EM, Ranexa 500 mg - 1 breakfast, Sertraline 500 mg - 1 breakfast, Rosuvastatin 20mg  - 1 breakfast   Patient declined the following medications: Nitroglycerin 0.4 mg - 1 tablet under tongue every 5 minutes as needed , Fluticasone 50 MG- Spray 2 sprays in both nostrils daily, Ipratropium  0.02% - take  2.5Mls by nebulization every 4 hours as needed, Alendronate 70 MG- take 1 tablet by mouth once weekly, Ranolazine Er 500 MG- take 1 tablet by mouth 2 times daily, Xyzal 5 mg- take 5 MG by mouth daily as needed, Ubrelvy 100 MG- take 1 tablet by mouth as needed, Breo-Elipta 100-25 MCG- Inhale one puff into lung once daily, Ephinephrine due to having enough on hand.   Patient needs refills for Carvedilol 3.125 Mg Celecoxib 200 mg - 1 breakfast, Diazepam 5mg  - TID PRN (VIALS), Nexium 20 mg - 1 BB, Furosemide 40 mg - 1 am and  tablet afternoon as needed. Please put am dose in packaging and give 15 tablets in bottle, Gabapentin 800 mg - 1 breakfast, 1 EM, Topiramate 100mg  - 1 bedtime, Entresto 24-26mg  - 1 breakfast, 1 EM, Ranexa 500 mg - 1 breakfast, Sertraline 500 mg - 1 breakfast, Rosuvastatin 20mg  - 1 breakfast.  Confirmed delivery date of 06/01/20, advised patient that pharmacy will contact them the morning of delivery.  Follow-Up:  Comptroller and Pharmacist Review   [place] Pharmacist Assistant  [phone]

## 2020-05-29 ENCOUNTER — Ambulatory Visit: Payer: Medicare Other | Admitting: Legal Medicine

## 2020-06-01 ENCOUNTER — Telehealth: Payer: Self-pay

## 2020-06-01 ENCOUNTER — Ambulatory Visit (INDEPENDENT_AMBULATORY_CARE_PROVIDER_SITE_OTHER): Payer: Medicare Other

## 2020-06-01 DIAGNOSIS — I420 Dilated cardiomyopathy: Secondary | ICD-10-CM

## 2020-06-01 LAB — CUP PACEART REMOTE DEVICE CHECK
Battery Remaining Longevity: 120 mo
Battery Voltage: 2.98 V
Brady Statistic AP VP Percent: 0.03 %
Brady Statistic AP VS Percent: 24.15 %
Brady Statistic AS VP Percent: 0.03 %
Brady Statistic AS VS Percent: 75.79 %
Brady Statistic RA Percent Paced: 23.81 %
Brady Statistic RV Percent Paced: 0.06 %
Date Time Interrogation Session: 20211007001707
HighPow Impedance: 72 Ohm
Implantable Lead Implant Date: 20120502
Implantable Lead Implant Date: 20120502
Implantable Lead Location: 753859
Implantable Lead Location: 753860
Implantable Lead Model: 4076
Implantable Lead Model: 7122
Implantable Pulse Generator Implant Date: 20200925
Lead Channel Impedance Value: 361 Ohm
Lead Channel Impedance Value: 589 Ohm
Lead Channel Impedance Value: 722 Ohm
Lead Channel Pacing Threshold Amplitude: 0.5 V
Lead Channel Pacing Threshold Amplitude: 1.125 V
Lead Channel Pacing Threshold Pulse Width: 0.4 ms
Lead Channel Pacing Threshold Pulse Width: 0.4 ms
Lead Channel Sensing Intrinsic Amplitude: 3.125 mV
Lead Channel Sensing Intrinsic Amplitude: 3.125 mV
Lead Channel Sensing Intrinsic Amplitude: 9.25 mV
Lead Channel Sensing Intrinsic Amplitude: 9.25 mV
Lead Channel Setting Pacing Amplitude: 1.5 V
Lead Channel Setting Pacing Amplitude: 2.25 V
Lead Channel Setting Pacing Pulse Width: 0.4 ms
Lead Channel Setting Sensing Sensitivity: 0.3 mV

## 2020-06-01 NOTE — Telephone Encounter (Signed)
Carelink alert received 06/01/20 for Optivol elevated and thoracic impedance trending below baseline. Per med list Lasix 40 mg daily. If you gain 3 pounds in 1 day or 5 pounds in 2 days, take 0.5 (20 mg)  tablet in the afternoon. Entresto 24-26 mg BID.  Called patient to assess.  No answer, unable to leave VM d/t VM not accepting messages at this time.

## 2020-06-02 DIAGNOSIS — G8929 Other chronic pain: Secondary | ICD-10-CM | POA: Diagnosis not present

## 2020-06-02 DIAGNOSIS — M5136 Other intervertebral disc degeneration, lumbar region: Secondary | ICD-10-CM | POA: Diagnosis not present

## 2020-06-02 DIAGNOSIS — M545 Low back pain, unspecified: Secondary | ICD-10-CM | POA: Diagnosis not present

## 2020-06-02 DIAGNOSIS — M419 Scoliosis, unspecified: Secondary | ICD-10-CM | POA: Diagnosis not present

## 2020-06-02 DIAGNOSIS — Z23 Encounter for immunization: Secondary | ICD-10-CM | POA: Diagnosis not present

## 2020-06-02 DIAGNOSIS — Z79899 Other long term (current) drug therapy: Secondary | ICD-10-CM | POA: Diagnosis not present

## 2020-06-05 NOTE — Progress Notes (Signed)
Remote ICD transmission.   

## 2020-06-08 DIAGNOSIS — M1711 Unilateral primary osteoarthritis, right knee: Secondary | ICD-10-CM | POA: Diagnosis not present

## 2020-06-08 DIAGNOSIS — I2510 Atherosclerotic heart disease of native coronary artery without angina pectoris: Secondary | ICD-10-CM | POA: Diagnosis not present

## 2020-06-08 DIAGNOSIS — M1712 Unilateral primary osteoarthritis, left knee: Secondary | ICD-10-CM | POA: Diagnosis not present

## 2020-06-08 DIAGNOSIS — J9611 Chronic respiratory failure with hypoxia: Secondary | ICD-10-CM | POA: Diagnosis not present

## 2020-06-08 DIAGNOSIS — I5022 Chronic systolic (congestive) heart failure: Secondary | ICD-10-CM | POA: Diagnosis not present

## 2020-06-09 NOTE — Telephone Encounter (Signed)
Patient reports she has experienced diarrhea throughout most of last month into this month, has since improved. States she was put on abx and has since completed it. Reports of drinking plenty of fluids to stay hydrated. Patient does reports of a small amount of swelling noted in her legs. Compliant with medications. Denies any shortness of breath or other HF complaints.   Patient advised I will forward to Dr. Curt Bears and we will call if there are any changes. Verbalized understanding.

## 2020-06-11 DIAGNOSIS — J449 Chronic obstructive pulmonary disease, unspecified: Secondary | ICD-10-CM | POA: Diagnosis not present

## 2020-06-15 ENCOUNTER — Other Ambulatory Visit: Payer: Self-pay | Admitting: Legal Medicine

## 2020-06-15 DIAGNOSIS — M4726 Other spondylosis with radiculopathy, lumbar region: Secondary | ICD-10-CM

## 2020-06-15 DIAGNOSIS — I5022 Chronic systolic (congestive) heart failure: Secondary | ICD-10-CM

## 2020-06-28 ENCOUNTER — Telehealth: Payer: Self-pay

## 2020-06-28 NOTE — Chronic Care Management (AMB) (Addendum)
Chronic Care Management Pharmacy Assistant   Name: [name]  MRN: [id number] DOB: [birth year]  Reason for Encounter: Medication Review  Patient Questions:  1.  Have you seen any other providers since your last visit? Yes- 07/05/2020- Dr Henrene Pastor (PCP).  2.  Any changes in your medicines or health? Yes, 06/15/2020-(refill request) Changes on Celebrex 200 mg- 1 capsule every morning, Furosemide 40 mg- 1 tablet every morning ay take 0.5 tablet in the afternoon, Sacubitril-Valsartan 24/26 mg- 1 tablet every morning and 1 tablet every evening by Dr Henrene Pastor (PCP).     PCP : Lillard Anes, MD  Allergies:  No Known Allergies  Medications: Outpatient Encounter Medications as of 06/28/2020  Medication Sig   aspirin 81 MG tablet Take 81 mg by mouth daily.     BREO ELLIPTA 100-25 MCG/INH AEPB INHALE ONE PUFF INTO THE LUNG ONCE DAILY   Buprenorphine HCl (BELBUCA) 900 MCG FILM Place 900 mcg inside cheek every 12 (twelve) hours.   carvedilol (COREG) 3.125 MG tablet Take 1 tablet (3.125 mg total) by mouth 2 (two) times daily.   celecoxib (CELEBREX) 200 MG capsule TAKE ONE CAPSULE BY MOUTH EVERY MORNING   clobetasol ointment (TEMOVATE) 0.05 % Apply topically.   cyclobenzaprine (FLEXERIL) 10 MG tablet Take 10 mg by mouth 3 (three) times daily as needed for muscle spasms.   denosumab (PROLIA) 60 MG/ML SOSY injection Inject 60 mg into the skin every 6 (six) months.   diazepam (VALIUM) 5 MG tablet TAKE ONE TABLET BY MOUTH THREE times A DAY AS NEEDED   diphenoxylate-atropine (LOMOTIL) 2.5-0.025 MG tablet Take 1 tablet by mouth 4 (four) times daily as needed for diarrhea or loose stools.   ENTRESTO 24-26 MG TAKE ONE TABLET BY MOUTH EVERY MORNING and TAKE ONE TABLET BY MOUTH EVERY EVENING   EPINEPHRINE 0.3 mg/0.3 mL IJ SOAJ injection Inject 0.3 mLs (0.3 mg total) into the muscle as needed for anaphylaxis.   esomeprazole (NEXIUM) 20 MG capsule TAKE 1 CAPSULE BY MOUTH DAILY   fluticasone (FLONASE)  50 MCG/ACT nasal spray Place 2 sprays into both nostrils daily as needed (congestion).   furosemide (LASIX) 40 MG tablet TAKE ONE TABLET BY MOUTH EVERY MORNING MAY take 0.5 tablet in THE afternoon IF needed   gabapentin (NEURONTIN) 800 MG tablet Take 1 tablet (800 mg total) by mouth 3 (three) times daily. Patient usually takes twice a day   ipratropium (ATROVENT) 0.02 % nebulizer solution Take 2.5 mLs (0.5 mg total) by nebulization every 4 (four) hours as needed for wheezing or shortness of breath.   Ketoprofen 25 MG CAPS Take by mouth.   levocetirizine (XYZAL) 5 MG tablet Take 5 mg by mouth daily as needed.   Multiple Vitamins-Minerals (AIRBORNE GUMMIES PO) Take 3 tablets by mouth daily in the afternoon.   nitrofurantoin, macrocrystal-monohydrate, (MACROBID) 100 MG capsule Take 1 capsule (100 mg total) by mouth 2 (two) times daily.   nitroGLYCERIN (NITROSTAT) 0.4 MG SL tablet Place 1 tablet (0.4 mg total) under the tongue every 5 (five) minutes as needed for chest pain.   ondansetron (ZOFRAN) 4 MG tablet Take 1 tablet (4 mg total) by mouth every 8 (eight) hours as needed for nausea or vomiting.   Oxycodone HCl 10 MG TABS Take 1 tablet by mouth every 4 (four) hours as needed.   ranolazine (RANEXA) 500 MG 12 hr tablet Take 1 tablet (500 mg total) by mouth 2 (two) times daily.   rosuvastatin (CRESTOR) 20 MG tablet TAKE  1 TABLET(20 MG) BY MOUTH DAILY   sertraline (ZOLOFT) 50 MG tablet TAKE 1 TABLET BY MOUTH DAILY   tiZANidine (ZANAFLEX) 4 MG tablet Take 4 mg by mouth at bedtime.   topiramate (TOPAMAX) 50 MG tablet Take 2 tablets (100 mg total) by mouth at bedtime.   Ubrogepant (UBRELVY) 100 MG TABS Take 1 tablet by mouth as needed (May repeat 1 tablet after 2 hours if needed.  Maximum 2 tablets in 24 hours).   No facility-administered encounter medications on file as of 06/28/2020.    Current Diagnosis: Patient Active Problem List   Diagnosis Date Noted   Dehydration 05/17/2020   Hypotension  05/17/2020   Persistent vomiting 05/17/2020   Diarrhea 05/17/2020   Cystitis 05/17/2020   BMI 26.0-26.9,adult 03/29/2020   Obstructive chronic bronchitis with exacerbation ([place]) 10/25/2019   Admission for long-term opiate analgesic use 10/24/2019   Presence of left artificial hip joint 10/24/2019   Senile osteoporosis 10/24/2019   Chronic hypoxemic respiratory failure ([place]) 10/24/2019   Other spondylosis with radiculopathy, lumbar region 10/24/2019   Major depressive disorder, single episode, moderate ([place]) 10/24/2019   Migraine without aura with status migrainosus 10/24/2019   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   Chronic pain of both knees 83/66/2947   Chronic systolic congestive heart failure, NYHA class 2 ([place]) 06/19/2017   Dilated cardiomyopathy ([place]) 06/19/2017   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   Arthritis of right hip 05/29/2016   Coronary artery disease involving native coronary artery of native heart without angina pectoris 05/31/2015   Dyslipidemia 05/31/2015   Essential hypertension 05/31/2015   Ileus following gastrointestinal surgery ([place]) 03/26/2015   Chronic narcotic use 03/23/2015   GERD (gastroesophageal reflux disease)    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  OV- 07/05/2020- Dr Henrene Pastor (PCP) since last care coordination call/Pharmacist visit.   Medication changes indicated in the chart: 06/15/2020- Changes on Celebrex 200 mg- 1 capsule every morning, Furosemide 40 mg- 1 tablet every morning may take 0.5 tablet in the afternoon, Sacubitril-Valsartan 24/26 mg- 1 tablet every morning and 1 tablet every evening by Dr Henrene Pastor (PCP).    BP Readings from Last 3 Encounters:  05/19/20 90/60  05/17/20 (!) 80/60    04/05/20 108/70    No results found for: HGBA1C   Patient obtains medications through Adherence Packaging  30 Days (Patient wants to change from packages to vials due to changes in medications, controlled substances and overabundance of medications from [facility])  Last adherence delivery included:  Carvedilol 3.125 Mg- 1 tablet twice daily  Celecoxib 200 mg - 1 tablet daily (breakfast) Diazepam 5 mg - TID PRN (VIALS) Nexium 20 mg - 1 capsule daily (before breakfast) Furosemide 40 mg - 1 tablet daily (breakfast) and  tablet afternoon as needed (#15 in Vials)  Gabapentin 800 mg - 1 tablet twice daily (breakfast,  evening meal) Topiramate 100 mg - 1 tablet daily (bedtime) Entresto 24-26 mg - 1 tablet twice daily (breakfast, evening meal)  Ranexa 500 mg - 1 tablet daily ( breakfast) Sertraline 500 mg - 1 tablet daily (breakfast) Rosuvastatin 20 mg - 1 tablet daily (breakfast)  Patient declined the following medications: Nitroglycerin 0.4 mg - 1 tablet under tongue every 5 minutes as needed due to PRN use. Fluticasone 50 MG- Spray  2 sprays in both nostrils daily due to PRN use. Ipratropium 0.02% - take  2.5Mls by nebulization every 4 hours as needed due to PRN use.  Alendronate 70 MG- take 1 tablet by mouth once weekly Ranolazine Er 500 MG- take 1 tablet by mouth 2 times daily  Xyzal 5 mg- take 5 MG by mouth daily as needed due PRN use.  Ubrelvy 100 MG- take 1 tablet by mouth as needed Breo-Elipta 100-25 MCG- Inhale one puff into lung once daily Epinephrine due to having enough on hand last month due to PRN use/additional supply on hand.  Patient is due for next adherence delivery on: 06/30/2020. 06/28/2020- Called patient, left message to return call. 07/05/2020- Called patient, home health aid- Megan answered, patient at office visit with PCP, will return call when she returns to review medications and coordinate delivery. 07/07/2020- Called patient, spoke with Megan-home health aid  and patient, reviewed medications and coordinated delivery.  This delivery to include: Diazepam 5 mg -  1 tablet three times daily PRN  Entresto 24-26 mg - 1 tablet twice daily  Rosuvastatin 20 mg - 1 tablet daily Breo-Elipta 100-25 MCG- Inhale one puff into lung once daily Ranolazine Er 500 MG- take 1 tablet by mouth 2 times daily   No short fill or acute fill needed.  Patient declined the following medications:  Carvedilol 3.125 Mg- 1 tablet twice daily due to having an adequate supply from [facility] #80-90 on hand. Celecoxib 200 mg - 1 tablet daily due to PRN use. Nexium 20 mg - 1 capsule daily due to having an adequate supply on hand 3 bottles from [facility]. Furosemide 40 mg - 1 tablet daily in the morning and  tablet in the  afternoon as needed due to having an adequate supply on hand, starting 06/18/20 fill from [facility]. Used all from [facility] first. Gabapentin 800 mg - 1 tablet twice daily due to having and adequate supply on hand, 2-3 bottles from [facility] Topiramate 100 mg - Patient is now taking 3 tablets at night, using [facility] supply, completed 05/26/20 fill from [facility]. Sertraline 500 mg - 1 tablet daily due to receiving a #90 from [place]. Nitroglycerin 0.4 mg - 1 tablet under tongue every 5 minutes as needed due to PRN use. Fluticasone 50 MG- Spray 2 sprays in both nostrils daily due to PRN use. Ipratropium 0.02% - take  2.5Mls by nebulization every 4 hours as needed due to PRN use.  Alendronate 70 MG- take 1 tablet by mouth once weekly due to being discontinued, therapy changed to Prolia injections.  Xyzal 5 mg- take 5 MG by mouth daily as needed due PRN use.  Epinephrine due to PRN use. Linzess 145 mcg- 1 tablet daily due to having an adequate supply on hand and uses PRN.   Refills not needed.  Confirmed delivery date of 07/10/2020, advised patient that pharmacy will contact them the morning of delivery. Follow-Up:  Coordination of Enhanced Pharmacy Services  and Patient Assistance Coordination  Patient would like to use all of [facility] supply of medications prior to getting more from [place], CPP notified.  Pattricia Boss, [place] Pharmacist Assistant [phone]

## 2020-06-30 ENCOUNTER — Ambulatory Visit: Payer: Medicare Other | Admitting: Legal Medicine

## 2020-06-30 DIAGNOSIS — M419 Scoliosis, unspecified: Secondary | ICD-10-CM | POA: Diagnosis not present

## 2020-06-30 DIAGNOSIS — M545 Low back pain, unspecified: Secondary | ICD-10-CM | POA: Diagnosis not present

## 2020-06-30 DIAGNOSIS — G8929 Other chronic pain: Secondary | ICD-10-CM | POA: Diagnosis not present

## 2020-06-30 DIAGNOSIS — M5136 Other intervertebral disc degeneration, lumbar region: Secondary | ICD-10-CM | POA: Diagnosis not present

## 2020-06-30 DIAGNOSIS — Z79899 Other long term (current) drug therapy: Secondary | ICD-10-CM | POA: Diagnosis not present

## 2020-07-03 NOTE — Progress Notes (Deleted)
NEUROLOGY FOLLOW UP OFFICE NOTE  [name] [id number]  HISTORY OF PRESENT ILLNESS: [name] is a 64 year old Caucasian woman with CHF with EF 30-35%, s/p AICD, COPD, chronic back pain, chronic knee pain, arthritis, HTN,hyperlipidemiaand PTSD and history of MI and former smokerand history of childhood seizureswhofollows up for migraines.  UPDATE: In February, recommended tapering off of gabapentin to see if myoclonus resolves.  Topiramate was increased to 100mg  at bedtime. Intensity:severe Duration:All day. 1 hour if takes [place] immediately.  Frequency:every other day. Frequency of abortive medication:sumatriptan daily Current NSAIDS:ASA 81mg ; Celebrex Current analgesics:none Current triptans:none Current ergotamine:none Current anti-emetic:none Current muscle relaxants:none Current anti-anxiolytic:Valium Current sleep aide:none Current Antihypertensive medications:Coreg, Lasix Current Antidepressant medications:none Current Anticonvulsant medications:Topiramate 100mg  at bedtime; gabapentin800mg  twice daily Current anti-CGRP:Ubrelvy 100mg  Current Vitamins/Herbal/Supplements:none Current Antihistamines/Decongestants:Flonase Other therapy:none Hormone/birth control:none  Caffeine:3 cups of coffee daily Alcohol:no Smoker:former Diet:100 oz water daily. No soda.  Exercise:no  HISTORY: She has had headaches since around 2017 but gradually got worse, particularly since November 2019.They are severe bifrontal pounding/squeezing headache. There is noaura.They are associated withphotophobia, phonophobia, blurred vision and dizziness.They typically last 1 hourand occur 5 to 6 a day every day. They are triggered by loud noise.Sumatriptan helps relieve the intensity but does not abort it..  Since November 2019, shealso has had episodes of dizziness outside of the dizziness as well,causing her to fall.  She reports sudden spinning sensation lasting a few seconds and resolves. She says it is not positional. She has associated nausea and blurred vision. She says it occurs 2 to 3 times a day.   She had a CT of head without contrast on 11/02/18 which was personally reviewed and was unremarkable, demonstrating mild atrophy and mild chronic small vessel ischemic changes but no acute intracranial abnormality.  She is treated by pain management for chronic pain. She has degenerative disc disease of the cervical spine with previous neck fusion. She has history of total right hip replacement. She has chronic low back pain with radicular symptoms. She has bilateral knee pain.  Past NSAIDS:none Past analgesics:Tylenol (contraindicated due to CHF) Past abortive triptans:sumatriptan 25mg  Past abortive ergotamine:none Past muscle relaxants:none Past anti-emetic:none Past antihypertensive medications:lisinopril Past antidepressant medications:none Past anticonvulsant medications:none Past anti-CGRP:none Past vitamins/Herbal/Supplements:none Past antihistamines/decongestants:none Other past therapies:none  PAST MEDICAL HISTORY: Past Medical History:  Diagnosis Date  . Admission for long-term opiate analgesic use 10/24/2019  . Arthritis of right hip 05/29/2016   Formatting of this note might be different from the original. Added automatically from request for surgery [phone]  . Cardiomyopathy [facility])    Overview:  Ejection fraction 45% in 2015 Ejection fraction 30 to 35% in November 2018  . Chronic back pain   . Chronic hypoxemic respiratory failure ([place]) 10/24/2019  . Chronic narcotic use 03/23/2015  . Chronic pain of both knees 12/21/2018   Added automatically from request for surgery [id number]  Formatting of this note might be different from the original. Added automatically from request for surgery [phone]  . Chronic systolic congestive heart failure, NYHA class 2 ([place])  06/19/2017  . COPD (chronic obstructive pulmonary disease) ([place])   . Coronary artery disease involving native coronary artery of native heart without angina pectoris 05/31/2015   Overview:  Abnormal stress test in fall of 2016, cardiac catheterization showed normal coronaries.  . Dilated cardiomyopathy ([place]) 06/19/2017  . 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 Dyslipidemia 05/31/2015  . Essential hypertension 05/31/2015  . GERD (  gastroesophageal reflux disease)   . ICD (implantable cardiac defibrillator) in place 2012  . Ileus following gastrointestinal surgery ([place]) 03/26/2015  . Major depressive disorder, single episode, moderate ([place]) 10/24/2019  . Migraine without aura with status migrainosus 10/24/2019  . Nausea alone 04/19/2014  . Obstructive chronic bronchitis with exacerbation ([place]) 10/25/2019  . Other spondylosis with radiculopathy, lumbar region 10/24/2019  . Presence of left artificial hip joint 10/24/2019  . PTSD (post-traumatic stress disorder)   . 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 Senile osteoporosis 10/24/2019  . Thyroid disease     MEDICATIONS: Current Outpatient Medications on File Prior to Visit  Medication Sig Dispense Refill  . aspirin 81 MG tablet Take 81 mg by mouth daily.      Marland Kitchen BREO ELLIPTA 100-25 MCG/INH AEPB INHALE ONE PUFF INTO THE LUNG ONCE DAILY 28 each 4  . Buprenorphine HCl (BELBUCA) 900 MCG FILM Place 900 mcg inside cheek every 12 (twelve) hours.    . carvedilol (COREG) 3.125 MG tablet Take 1 tablet (3.125 mg total) by mouth 2 (two) times daily. 180 tablet 2  . celecoxib (CELEBREX) 200 MG capsule TAKE ONE CAPSULE BY MOUTH EVERY MORNING 90 capsule 2  . clobetasol ointment (TEMOVATE) 0.05 % Apply topically.    . cyclobenzaprine (FLEXERIL) 10 MG tablet Take 10 mg by mouth 3 (three) times daily as needed for muscle spasms.    Marland Kitchen denosumab  (PROLIA) 60 MG/ML SOSY injection Inject 60 mg into the skin every 6 (six) months. 180 mL 2  . diazepam (VALIUM) 5 MG tablet TAKE ONE TABLET BY MOUTH THREE times A DAY AS NEEDED 30 tablet 2  . diphenoxylate-atropine (LOMOTIL) 2.5-0.025 MG tablet Take 1 tablet by mouth 4 (four) times daily as needed for diarrhea or loose stools. 30 tablet 0  . ENTRESTO 24-26 MG TAKE ONE TABLET BY MOUTH EVERY MORNING and TAKE ONE TABLET BY MOUTH EVERY EVENING 180 tablet 2  . EPINEPHRINE 0.3 mg/0.3 mL IJ SOAJ injection Inject 0.3 mLs (0.3 mg total) into the muscle as needed for anaphylaxis. 1 each 2  . esomeprazole (NEXIUM) 20 MG capsule TAKE 1 CAPSULE BY MOUTH DAILY 90 capsule 2  . fluticasone (FLONASE) 50 MCG/ACT nasal spray Place 2 sprays into both nostrils daily as needed (congestion). 18 mL 2  . furosemide (LASIX) 40 MG tablet TAKE ONE TABLET BY MOUTH EVERY MORNING MAY take 0.5 tablet in THE afternoon IF needed 90 tablet 2  . gabapentin (NEURONTIN) 800 MG tablet Take 1 tablet (800 mg total) by mouth 3 (three) times daily. Patient usually takes twice a day 270 tablet 2  . ipratropium (ATROVENT) 0.02 % nebulizer solution Take 2.5 mLs (0.5 mg total) by nebulization every 4 (four) hours as needed for wheezing or shortness of breath. 150 mL 2  . Ketoprofen 25 MG CAPS Take by mouth.    . levocetirizine (XYZAL) 5 MG tablet Take 5 mg by mouth daily as needed.    . Multiple Vitamins-Minerals (AIRBORNE GUMMIES PO) Take 3 tablets by mouth daily in the afternoon.    . nitrofurantoin, macrocrystal-monohydrate, (MACROBID) 100 MG capsule Take 1 capsule (100 mg total) by mouth 2 (two) times daily. 14 capsule 2  . nitroGLYCERIN (NITROSTAT) 0.4 MG SL tablet Place 1 tablet (0.4 mg total) under the tongue every 5 (five) minutes as needed for chest pain. 25 tablet 6  . ondansetron (ZOFRAN) 4 MG tablet Take 1 tablet (4 mg total) by mouth every 8 (  eight) hours as needed for nausea or vomiting. 20 tablet 0  . Oxycodone HCl 10 MG TABS Take  1 tablet by mouth every 4 (four) hours as needed.    . ranolazine (RANEXA) 500 MG 12 hr tablet Take 1 tablet (500 mg total) by mouth 2 (two) times daily. 180 tablet 2  . rosuvastatin (CRESTOR) 20 MG tablet TAKE 1 TABLET(20 MG) BY MOUTH DAILY 90 tablet 2  . sertraline (ZOLOFT) 50 MG tablet TAKE 1 TABLET BY MOUTH DAILY 90 tablet 2  . tiZANidine (ZANAFLEX) 4 MG tablet Take 4 mg by mouth at bedtime.    . topiramate (TOPAMAX) 50 MG tablet Take 2 tablets (100 mg total) by mouth at bedtime. 240 tablet 2  . Ubrogepant (UBRELVY) 100 MG TABS Take 1 tablet by mouth as needed (May repeat 1 tablet after 2 hours if needed.  Maximum 2 tablets in 24 hours). 16 tablet 6   No current facility-administered medications on file prior to visit.    ALLERGIES: No Known Allergies  FAMILY HISTORY: Family History  Problem Relation Age of Onset  . Heart attack Other   . Cancer Other   . Heart failure Other   . Hypertension Mother   . Heart attack Mother   . Alcohol abuse Father   . Hypertension Father   . Heart attack Father   . Anesthesia problems Neg Hx   . Hypotension Neg Hx   . Malignant hyperthermia Neg Hx   . Pseudochol deficiency Neg Hx    SOCIAL HISTORY: Social History   Socioeconomic History  . Marital status: Single    Spouse name: Not on file  . Number of children: 3  . Years of education: Not on file  . Highest education level: Not on file  Occupational History  . Occupation: disabled  Tobacco Use  . Smoking status: Current Every Day Smoker    Packs/day: 0.50    Years: 30.00    Pack years: 15.00    Types: Cigarettes    Last attempt to quit: 08/11/2010    Years since quitting: 9.9  . Smokeless tobacco: Never Used  Vaping Use  . Vaping Use: Never used  Substance and Sexual Activity  . Alcohol use: Yes    Alcohol/week: 2.0 standard drinks    Types: 1 Cans of beer, 1 Standard drinks or equivalent per week    Comment: seldom  . Drug use: No  . Sexual activity: Not Currently   Other Topics Concern  . Not on file  Social History Narrative   One level home with boyfriend   Caffeine - coffee 1-2 cups/day; Green tea 4-5 bottles a day   Exercise - some    Right handed      Social Determinants of Health   Financial Resource Strain:   . Difficulty of Paying Living Expenses: Not on file  Food Insecurity:   . Worried About Charity fundraiser in the Last Year: Not on file  . Ran Out of Food in the Last Year: Not on file  Transportation Needs:   . Lack of Transportation (Medical): Not on file  . Lack of Transportation (Non-Medical): Not on file  Physical Activity:   . Days of Exercise per Week: Not on file  . Minutes of Exercise per Session: Not on file  Stress:   . Feeling of Stress : Not on file  Social Connections:   . Frequency of Communication with Friends and Family: Not on file  . Frequency of  Social Gatherings with Friends and Family: Not on file  . Attends Religious Services: Not on file  . Active Member of Clubs or Organizations: Not on file  . Attends Archivist Meetings: Not on file  . Marital Status: Not on file  Intimate Partner Violence:   . Fear of Current or Ex-Partner: Not on file  . Emotionally Abused: Not on file  . Physically Abused: Not on file  . Sexually Abused: Not on file    PHYSICAL EXAM: *** General: No acute distress.  Patient appears well-groomed.   Head:  Normocephalic/atraumatic Eyes:  Fundi examined but not visualized Neck: supple, no paraspinal tenderness, full range of motion Heart:  Regular rate and rhythm Lungs:  Clear to auscultation bilaterally Back: No paraspinal tenderness Neurological Exam: alert and oriented to person, place, and time. Attention span and concentration intact, recent and remote memory intact, fund of knowledge intact.  Speech fluent and not dysarthric, language intact.  CN II-XII intact. Bulk and tone normal, muscle strength 5/5 throughout.  Sensation to light touch, temperature and  vibration intact.  Deep tendon reflexes 2+ throughout, toes downgoing.  Finger to nose and heel to shin testing intact.  Gait normal, Romberg negative.  IMPRESSION: ***  PLAN: ***  Metta Clines, DO  CC: ***

## 2020-07-04 ENCOUNTER — Ambulatory Visit: Payer: Medicare Other | Admitting: Neurology

## 2020-07-05 ENCOUNTER — Other Ambulatory Visit: Payer: Self-pay

## 2020-07-05 ENCOUNTER — Encounter: Payer: Self-pay | Admitting: Legal Medicine

## 2020-07-05 ENCOUNTER — Ambulatory Visit (INDEPENDENT_AMBULATORY_CARE_PROVIDER_SITE_OTHER): Payer: Medicare Other | Admitting: Legal Medicine

## 2020-07-05 VITALS — BP 78/50 | HR 64 | Temp 97.5°F | Resp 17 | Ht 60.0 in | Wt 130.6 lb

## 2020-07-05 DIAGNOSIS — I10 Essential (primary) hypertension: Secondary | ICD-10-CM

## 2020-07-05 DIAGNOSIS — F321 Major depressive disorder, single episode, moderate: Secondary | ICD-10-CM

## 2020-07-05 DIAGNOSIS — K219 Gastro-esophageal reflux disease without esophagitis: Secondary | ICD-10-CM

## 2020-07-05 DIAGNOSIS — E079 Disorder of thyroid, unspecified: Secondary | ICD-10-CM

## 2020-07-05 DIAGNOSIS — E785 Hyperlipidemia, unspecified: Secondary | ICD-10-CM

## 2020-07-05 DIAGNOSIS — I951 Orthostatic hypotension: Secondary | ICD-10-CM

## 2020-07-05 DIAGNOSIS — I5022 Chronic systolic (congestive) heart failure: Secondary | ICD-10-CM

## 2020-07-05 DIAGNOSIS — J449 Chronic obstructive pulmonary disease, unspecified: Secondary | ICD-10-CM | POA: Diagnosis not present

## 2020-07-05 NOTE — Progress Notes (Signed)
Subjective:  Patient ID: [name], female    DOB: Dec 01, 1955  Age: 64 y.o. MRN: [id number]  Chief Complaint  Patient presents with  . Hyperlipidemia  . Gastroesophageal Reflux  . COPD    HPI: chronic visit  She has been hypotensive with systolics under 80. She has orthostatics all the time.  She is on the lowest dose of entresto and low dose coreg. Pressure drops only 8 mmHG on orthostatics.  She is on furosemide and we will decrease dosage. 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  Patient presents with hyperlipidemia.  Compliance with treatment has been good; patient takes medicines as directed, maintains low cholesterol diet, follows up as directed, and maintains exercise regimen.  Patient is using crestor without problems.  Patient presents with HFrEF  that is stable. Diagnosis made 2016.  The course of the disease is stable.  Current medicines include entresto and furosemide and coreg.. Patient follows a low cholesterol diet and maintains a weight diary.  Patient is on low salt, low cholesterol diet and avoids alcohol.  Patient denies adverse effects of medicines. Patient is monitoring weight and has lost 0.6 lbs.of weight.  Patient is having no pedal edema, no PND and no PND.  Patient is continuing to see cardiology.   Current Outpatient Medications on File Prior to Visit  Medication Sig Dispense Refill  . aspirin 81 MG tablet Take 81 mg by mouth daily.      Marland Kitchen BREO ELLIPTA 100-25 MCG/INH AEPB INHALE ONE PUFF INTO THE LUNG ONCE DAILY 28 each 4  . Buprenorphine HCl (BELBUCA) 900 MCG FILM Place 900 mcg inside cheek every 12 (twelve) hours.    . carvedilol (COREG) 3.125 MG tablet Take 1 tablet (3.125 mg total) by mouth 2 (two) times daily. 180 tablet 2  . celecoxib (CELEBREX) 200 MG capsule TAKE ONE CAPSULE BY MOUTH EVERY MORNING 90 capsule 2  . clobetasol ointment (TEMOVATE) 0.05 % Apply topically.    . cyclobenzaprine (FLEXERIL) 10 MG tablet Take 10 mg by mouth 3  (three) times daily as needed for muscle spasms.    Marland Kitchen denosumab (PROLIA) 60 MG/ML SOSY injection Inject 60 mg into the skin every 6 (six) months. 180 mL 2  . diazepam (VALIUM) 5 MG tablet TAKE ONE TABLET BY MOUTH THREE times A DAY AS NEEDED 30 tablet 2  . diphenoxylate-atropine (LOMOTIL) 2.5-0.025 MG tablet Take 1 tablet by mouth 4 (four) times daily as needed for diarrhea or loose stools. 30 tablet 0  . ENTRESTO 24-26 MG TAKE ONE TABLET BY MOUTH EVERY MORNING and TAKE ONE TABLET BY MOUTH EVERY EVENING 180 tablet 2  . EPINEPHRINE 0.3 mg/0.3 mL IJ SOAJ injection Inject 0.3 mLs (0.3 mg total) into the muscle as needed for anaphylaxis. 1 each 2  . esomeprazole (NEXIUM) 20 MG capsule TAKE 1 CAPSULE BY MOUTH DAILY 90 capsule 2  . fluticasone (FLONASE) 50 MCG/ACT nasal spray Place 2 sprays into both nostrils daily as needed (congestion). 18 mL 2  . furosemide (LASIX) 40 MG tablet TAKE ONE TABLET BY MOUTH EVERY MORNING MAY take 0.5 tablet in THE afternoon IF needed 90 tablet 2  . gabapentin (NEURONTIN) 800 MG tablet Take 1 tablet (800 mg total) by mouth 3 (three) times daily. Patient usually takes twice a day 270 tablet 2  . ipratropium (ATROVENT) 0.02 % nebulizer solution Take 2.5 mLs (0.5 mg total) by nebulization every 4 (four) hours as needed for wheezing or shortness of breath. 150 mL 2  .  Ketoprofen 25 MG CAPS Take by mouth.    . levocetirizine (XYZAL) 5 MG tablet Take 5 mg by mouth daily as needed.    . Multiple Vitamins-Minerals (AIRBORNE GUMMIES PO) Take 3 tablets by mouth daily in the afternoon.    . nitrofurantoin, macrocrystal-monohydrate, (MACROBID) 100 MG capsule Take 1 capsule (100 mg total) by mouth 2 (two) times daily. 14 capsule 2  . nitroGLYCERIN (NITROSTAT) 0.4 MG SL tablet Place 1 tablet (0.4 mg total) under the tongue every 5 (five) minutes as needed for chest pain. 25 tablet 6  . ondansetron (ZOFRAN) 4 MG tablet Take 1 tablet (4 mg total) by mouth every 8 (eight) hours as needed for  nausea or vomiting. 20 tablet 0  . Oxycodone HCl 10 MG TABS Take 1 tablet by mouth every 4 (four) hours as needed.    . ranolazine (RANEXA) 500 MG 12 hr tablet Take 1 tablet (500 mg total) by mouth 2 (two) times daily. 180 tablet 2  . rosuvastatin (CRESTOR) 20 MG tablet TAKE 1 TABLET(20 MG) BY MOUTH DAILY 90 tablet 2  . sertraline (ZOLOFT) 50 MG tablet TAKE 1 TABLET BY MOUTH DAILY 90 tablet 2  . tiZANidine (ZANAFLEX) 4 MG tablet Take 4 mg by mouth at bedtime.    . topiramate (TOPAMAX) 50 MG tablet Take 2 tablets (100 mg total) by mouth at bedtime. 240 tablet 2  . Ubrogepant (UBRELVY) 100 MG TABS Take 1 tablet by mouth as needed (May repeat 1 tablet after 2 hours if needed.  Maximum 2 tablets in 24 hours). 16 tablet 6   No current facility-administered medications on file prior to visit.   Past Medical History:  Diagnosis Date  . Admission for long-term opiate analgesic use 10/24/2019  . Arthritis of right hip 05/29/2016   Formatting of this note might be different from the original. Added automatically from request for surgery [phone]  . Cardiomyopathy [facility])    Overview:  Ejection fraction 45% in 2015 Ejection fraction 30 to 35% in November 2018  . Chronic back pain   . Chronic hypoxemic respiratory failure ([place]) 10/24/2019  . Chronic narcotic use 03/23/2015  . Chronic pain of both knees 12/21/2018   Added automatically from request for surgery [id number]  Formatting of this note might be different from the original. Added automatically from request for surgery [phone]  . Chronic systolic congestive heart failure, NYHA class 2 ([place]) 06/19/2017  . COPD (chronic obstructive pulmonary disease) ([place])   . Coronary artery disease involving native coronary artery of native heart without angina pectoris 05/31/2015   Overview:  Abnormal stress test in fall of 2016, cardiac catheterization showed normal coronaries.  . Dilated cardiomyopathy ([place]) 06/19/2017  . 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 Dyslipidemia 05/31/2015  . Essential hypertension 05/31/2015  . GERD (gastroesophageal reflux disease)   . ICD (implantable cardiac defibrillator) in place 2012  . Ileus following gastrointestinal surgery ([place]) 03/26/2015  . Major depressive disorder, single episode, moderate ([place]) 10/24/2019  . Migraine without aura with status migrainosus 10/24/2019  . Nausea alone 04/19/2014  . Obstructive chronic bronchitis with exacerbation ([place]) 10/25/2019  . Other spondylosis with radiculopathy, lumbar region 10/24/2019  . Presence of left artificial hip joint 10/24/2019  . PTSD (post-traumatic stress disorder)   . 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 Senile osteoporosis 10/24/2019  . Thyroid disease    Past Surgical History:  Procedure Laterality  Date  . APPENDECTOMY    . CARDIAC CATHETERIZATION    . CARPAL TUNNEL RELEASE    . CESAREAN SECTION    . CHOLECYSTECTOMY    . CORONARY ANGIOPLASTY    . HAND SURGERY    . ICD GENERATOR CHANGEOUT N/A 05/21/2019   Procedure: ICD GENERATOR CHANGEOUT;  Surgeon: Constance Haw, MD;  Location: [facility];  Service: Cardiovascular;  Laterality: N/A;  . ICD IMPLANT     Medtronic  . intestinal blockage 2011    . LAPAROSCOPIC ASSISTED VENTRAL HERNIA REPAIR N/A 03/23/2015   Procedure: LAPAROSCOPIC VENTRAL WALL HERNIA REPAIR;  Surgeon: Michael Boston, MD;  Location: [facility];  Service: General;  Laterality: N/A;  With MESH  . LAPAROSCOPIC LYSIS OF ADHESIONS N/A 03/23/2015   Procedure: LAPAROSCOPIC LYSIS OF ADHESIONS;  Surgeon: Michael Boston, MD;  Location: [facility];  Service: General;  Laterality: N/A;  . NECK SURGERY     fused  . TONSILLECTOMY    . ULNAR NERVE TRANSPOSITION  01/23/2012   Procedure: ULNAR NERVE DECOMPRESSION/TRANSPOSITION;  Surgeon: Ophelia Charter, MD;  Location: [facility];  Service: Neurosurgery;  Laterality: Left;  LEFT  ulnar nerve decompression    Family History  Problem Relation Age of Onset  . Heart attack Other   . Cancer Other   . Heart failure Other   . Hypertension Mother   . Heart attack Mother   . Alcohol abuse Father   . Hypertension Father   . Heart attack Father   . Anesthesia problems Neg Hx   . Hypotension Neg Hx   . Malignant hyperthermia Neg Hx   . Pseudochol deficiency Neg Hx    Social History   Socioeconomic History  . Marital status: Single    Spouse name: Not on file  . Number of children: 3  . Years of education: Not on file  . Highest education level: Not on file  Occupational History  . Occupation: disabled  Tobacco Use  . Smoking status: Former Smoker    Packs/day: 0.50    Years: 30.00    Pack years: 15.00    Types: Cigarettes    Quit date: 03/2020    Years since quitting: 0.2  . Smokeless tobacco: Never Used  Vaping Use  . Vaping Use: Never used  Substance and Sexual Activity  . Alcohol use: Yes    Alcohol/week: 2.0 standard drinks    Types: 1 Cans of beer, 1 Standard drinks or equivalent per week    Comment: seldom  . Drug use: No  . Sexual activity: Not Currently  Other Topics Concern  . Not on file  Social History Narrative   One level home with boyfriend   Caffeine - coffee 1-2 cups/day; Green tea 4-5 bottles a day   Exercise - some    Right handed      Social Determinants of Health   Financial Resource Strain:   . Difficulty of Paying Living Expenses: Not on file  Food Insecurity:   . Worried About Charity fundraiser in the Last Year: Not on file  . Ran Out of Food in the Last Year: Not on file  Transportation Needs:   . Lack of Transportation (Medical): Not on file  . Lack of Transportation (Non-Medical): Not on file  Physical Activity:   . Days of Exercise per Week: Not on file  . Minutes of Exercise per Session: Not on file  Stress:   . Feeling of Stress : Not on  file  Social Connections:   . Frequency of Communication with  Friends and Family: Not on file  . Frequency of Social Gatherings with Friends and Family: Not on file  . Attends Religious Services: Not on file  . Active Member of Clubs or Organizations: Not on file  . Attends Archivist Meetings: Not on file  . Marital Status: Not on file    Review of Systems   Objective:  BP (!) 78/50 (BP Location: Right Arm, Patient Position: Sitting)   Pulse 64   Temp (!) 97.5 F (36.4 C) (Temporal)   Resp 17   Ht 5' (1.524 m)   Wt 130 lb 9.6 oz (59.2 kg)   SpO2 98%   BMI 25.51 kg/m   BP/Weight 07/05/2020 05/19/2020 7/82/9562  Systolic BP 78 90 80  Diastolic BP 50 60 60  Wt. (Lbs) 130.6 131 132.2  BMI 25.51 25.58 25.82    Physical Exam Vitals reviewed.  Constitutional:      Comments: sallow complexion  HENT:     Right Ear: Tympanic membrane normal.     Left Ear: Tympanic membrane normal.  Eyes:     Extraocular Movements: Extraocular movements intact.     Conjunctiva/sclera: Conjunctivae normal.     Pupils: Pupils are equal, round, and reactive to light.  Cardiovascular:     Rate and Rhythm: Normal rate and regular rhythm.     Pulses: Normal pulses.     Heart sounds: Normal heart sounds. No murmur heard.   Pulmonary:     Effort: Pulmonary effort is normal.  Abdominal:     General: Abdomen is flat. Bowel sounds are normal.     Palpations: Abdomen is soft.  Musculoskeletal:     Comments: Muscle wasting  Skin:    General: Skin is warm.     Capillary Refill: Capillary refill takes less than 2 seconds.  Neurological:     General: No focal deficit present.     Mental Status: She is alert.     Motor: Weakness (generalized) present.    Depression screen [facility] 2/9 07/05/2020 03/29/2020 12/22/2019  Decreased Interest 2 1 1   Down, Depressed, Hopeless 2 1 1   PHQ - 2 Score 4 2 2   Altered sleeping 3 0 2  Tired, decreased energy 3 1 2   Change in appetite 3 2 2   Feeling bad or failure about yourself  2 0 0  Trouble concentrating 2 0 0    Moving slowly or fidgety/restless 3 0 0  Suicidal thoughts 0 0 0  PHQ-9 Score 20 5 8   Difficult doing work/chores Not difficult at all Not difficult at all -     Lab Results  Component Value Date   WBC 9.3 12/20/2019   HGB 11.7 12/20/2019   HCT 36.1 12/20/2019   PLT 352 12/20/2019   GLUCOSE 164 (H) 02/15/2020   CHOL 164 12/20/2019   TRIG 110 12/20/2019   HDL 61 12/20/2019   LDLCALC 83 12/20/2019   ALT 5 02/15/2020   AST 18 02/15/2020   NA 142 02/15/2020   K 4.4 02/15/2020   CL 101 02/15/2020   CREATININE 1.12 (H) 02/15/2020   BUN 18 02/15/2020   CO2 26 02/15/2020   TSH 0.882 02/15/2020      Assessment & Plan:   1. Dyslipidemia AN INDIVIDUAL CARE PLAN for hyperlipidemia/ cholesterol was established and reinforced today.  The patient's status was assessed using clinical findings on exam, lab and other diagnostic tests. The patient's disease status  was assessed based on evidence-based guidelines and found to be well controlled. MEDICATIONS were reviewed. SELF MANAGEMENT GOALS have been discussed and patient's success at attaining the goal of low cholesterol was assessed. RECOMMENDATION given include regular exercise 3 days a week and low cholesterol/low fat diet. CLINICAL SUMMARY including written plan to identify barriers unique to the patient due to social or economic  reasons was discussed.  2. Essential hypertension An individual hypertension care plan was established and reinforced today.  The patient's status was assessed using clinical findings on exam and labs or diagnostic tests. The patient's success at meeting treatment goals on disease specific evidence-based guidelines and found to be poor controlled. SELF MANAGEMENT: The patient and I together assessed ways to personally work towards obtaining the recommended goals. Systolic BP low, we will decrease furosemide today. RECOMMENDATIONS: avoid decongestants found in common cold remedies, decrease consumption of  alcohol, perform routine monitoring of BP with home BP cuff, exercise, reduction of dietary salt, take medicines as prescribed, try not to miss doses and quit smoking.  Regular exercise and maintaining a healthy weight is needed.  Stress reduction may help. A CLINICAL SUMMARY including written plan identify barriers to care unique to individual due to social or financial issues.  We attempt to mutually creat solutions for individual and family understanding. We are checking aldosterone for possible hypoaldosteronism.  3. Chronic obstructive pulmonary disease, unspecified COPD type ([place]) An individualize plan was formulated for care of COPD.  Treatment is evidence based.  She will continue on inhalers, avoid smoking and smoke.  Regular exercise with help with dyspnea. Routine follow ups and medication compliance is needed.  4. Gastroesophageal reflux disease, unspecified whether esophagitis present Plan of care was formulated today.  She is doing well.  A plan of care was formulated using patient exam, tests and other sources to optimize care using evidence based information.  Recommend no smoking, no eating after supper, avoid fatty foods, elevate Head of bed, avoid tight fitting clothing.  Continue on OTC.  5. Major depressive disorder, single episode, moderate ([place]) Patient's depression is fairly controlled with zoloft.   Anhedonia unchanged.  PHQ 9 was performed score 20. An individual care plan was established or reinforced today.  The patient's disease status was assessed using clinical findings on exam, labs, and or other diagnostic testing to determine patient's success in meeting treatment goals based on disease specific evidence-based guidelines and found to be no changes.  The hypotension may be affecting this. Recommendations include stay on medicines now.  6. Chronic systolic congestive heart failure, NYHA class 2 ([place]) An individualized care plan was established and reinforced.  The  patient's disease status was assessed using clinical finding son exam today, labs, and/or other diagnostic testing such as x-rays, to determine the patient's success in meeting treatmentgoalsbased on disease-based guidelines and found to be stable. But not at goal yet.She may be dehydrated and furosemide decreased for one week.  Check BNP Medications prescriptions decrease lasix Laboratory tests ordered to be performed today include BNP. RECOMMENDATIONS: given include see cardiology.  Call physician is patient gains 3 lbs in one day or 5 lbs for one week.  Call for progressive PND, orthopnea or increased pedal edema.   Complex cardiac patient 30 minutes reviewing records and discussed possible treatment with patient.      Follow-up: Return in about 1 week (around 07/12/2020) for hypotension.  An After Visit Summary was printed and given to the patient.  [place] (  [phone] 

## 2020-07-05 NOTE — Patient Instructions (Signed)
Stop 12 pill of furosemide in afternoon.  This may help pressure

## 2020-07-07 DIAGNOSIS — I5022 Chronic systolic (congestive) heart failure: Secondary | ICD-10-CM | POA: Diagnosis not present

## 2020-07-07 DIAGNOSIS — I2510 Atherosclerotic heart disease of native coronary artery without angina pectoris: Secondary | ICD-10-CM | POA: Diagnosis not present

## 2020-07-07 DIAGNOSIS — J9611 Chronic respiratory failure with hypoxia: Secondary | ICD-10-CM | POA: Diagnosis not present

## 2020-07-10 ENCOUNTER — Other Ambulatory Visit: Payer: Self-pay | Admitting: Legal Medicine

## 2020-07-10 LAB — CBC WITH DIFFERENTIAL/PLATELET
Basophils Absolute: 0.1 10*3/uL (ref 0.0–0.2)
Basos: 1 %
EOS (ABSOLUTE): 0.3 10*3/uL (ref 0.0–0.4)
Eos: 3 %
Hematocrit: 32.8 % — ABNORMAL LOW (ref 34.0–46.6)
Hemoglobin: 10.9 g/dL — ABNORMAL LOW (ref 11.1–15.9)
Immature Grans (Abs): 0 10*3/uL (ref 0.0–0.1)
Immature Granulocytes: 0 %
Lymphocytes Absolute: 2.7 10*3/uL (ref 0.7–3.1)
Lymphs: 24 %
MCH: 30.4 pg (ref 26.6–33.0)
MCHC: 33.2 g/dL (ref 31.5–35.7)
MCV: 92 fL (ref 79–97)
Monocytes Absolute: 0.8 10*3/uL (ref 0.1–0.9)
Monocytes: 7 %
Neutrophils Absolute: 7.1 10*3/uL — ABNORMAL HIGH (ref 1.4–7.0)
Neutrophils: 65 %
Platelets: 324 10*3/uL (ref 150–450)
RBC: 3.58 x10E6/uL — ABNORMAL LOW (ref 3.77–5.28)
RDW: 11.6 % — ABNORMAL LOW (ref 11.7–15.4)
WBC: 11 10*3/uL — ABNORMAL HIGH (ref 3.4–10.8)

## 2020-07-10 LAB — COMPREHENSIVE METABOLIC PANEL
ALT: 6 IU/L (ref 0–32)
AST: 14 IU/L (ref 0–40)
Albumin/Globulin Ratio: 1.6 (ref 1.2–2.2)
Albumin: 3.8 g/dL (ref 3.8–4.8)
Alkaline Phosphatase: 72 IU/L (ref 44–121)
BUN/Creatinine Ratio: 12 (ref 12–28)
BUN: 16 mg/dL (ref 8–27)
Bilirubin Total: 0.2 mg/dL (ref 0.0–1.2)
CO2: 25 mmol/L (ref 20–29)
Calcium: 9.6 mg/dL (ref 8.7–10.3)
Chloride: 105 mmol/L (ref 96–106)
Creatinine, Ser: 1.37 mg/dL — ABNORMAL HIGH (ref 0.57–1.00)
GFR calc Af Amer: 46 mL/min/{1.73_m2} — ABNORMAL LOW (ref >59)
GFR calc non Af Amer: 40 mL/min/{1.73_m2} — ABNORMAL LOW (ref >59)
Globulin, Total: 2.4 g/dL (ref 1.5–4.5)
Glucose: 90 mg/dL (ref 65–99)
Potassium: 4.3 mmol/L (ref 3.5–5.2)
Sodium: 142 mmol/L (ref 134–144)
Total Protein: 6.2 g/dL (ref 6.0–8.5)

## 2020-07-10 LAB — LIPID PANEL
Chol/HDL Ratio: 3.1 ratio (ref 0.0–4.4)
Cholesterol, Total: 141 mg/dL (ref 100–199)
HDL: 46 mg/dL (ref >39)
LDL Chol Calc (NIH): 72 mg/dL (ref 0–99)
Triglycerides: 127 mg/dL (ref 0–149)
VLDL Cholesterol Cal: 23 mg/dL (ref 5–40)

## 2020-07-10 LAB — TSH: TSH: 0.987 u[IU]/mL (ref 0.450–4.500)

## 2020-07-10 LAB — ALDOSTERONE: ALDOSTERONE: 2.5 ng/dL (ref 0.0–30.0)

## 2020-07-10 LAB — CARDIOVASCULAR RISK ASSESSMENT

## 2020-07-10 NOTE — Progress Notes (Signed)
Aldosterone low normal, wbc 11,000 mostly neurophils down from 20,000 Infection. Continued anemia, creatinine rising, liver tests normal, Cholesterol normal, TSH 0.987 normal,  lp

## 2020-07-12 DIAGNOSIS — J441 Chronic obstructive pulmonary disease with (acute) exacerbation: Secondary | ICD-10-CM | POA: Diagnosis not present

## 2020-07-12 DIAGNOSIS — J449 Chronic obstructive pulmonary disease, unspecified: Secondary | ICD-10-CM | POA: Diagnosis not present

## 2020-07-13 ENCOUNTER — Other Ambulatory Visit: Payer: Self-pay

## 2020-07-13 ENCOUNTER — Encounter: Payer: Self-pay | Admitting: Legal Medicine

## 2020-07-13 ENCOUNTER — Ambulatory Visit (INDEPENDENT_AMBULATORY_CARE_PROVIDER_SITE_OTHER): Payer: Medicare Other | Admitting: Legal Medicine

## 2020-07-13 VITALS — BP 82/52 | HR 93 | Temp 94.8°F | Resp 16 | Ht 60.0 in | Wt 129.0 lb

## 2020-07-13 DIAGNOSIS — E2740 Unspecified adrenocortical insufficiency: Secondary | ICD-10-CM

## 2020-07-13 DIAGNOSIS — R102 Pelvic and perineal pain: Secondary | ICD-10-CM | POA: Diagnosis not present

## 2020-07-13 DIAGNOSIS — N3090 Cystitis, unspecified without hematuria: Secondary | ICD-10-CM | POA: Diagnosis not present

## 2020-07-13 HISTORY — DX: Unspecified adrenocortical insufficiency: E27.40

## 2020-07-13 LAB — POCT URINALYSIS DIP (CLINITEK)
Bilirubin, UA: NEGATIVE
Glucose, UA: NEGATIVE mg/dL
Ketones, POC UA: NEGATIVE mg/dL
Nitrite, UA: NEGATIVE
Spec Grav, UA: 1.015 (ref 1.010–1.025)
Urobilinogen, UA: 0.2 E.U./dL
pH, UA: 6 (ref 5.0–8.0)

## 2020-07-13 MED ORDER — FLUDROCORTISONE ACETATE 0.1 MG PO TABS: 0.1000 mg | ORAL_TABLET | ORAL | 3 refills | Status: DC

## 2020-07-13 MED ORDER — CIPROFLOXACIN HCL 250 MG PO TABS: 250.0000 mg | ORAL_TABLET | Freq: Two times a day (BID) | ORAL | 0 refills | Status: DC

## 2020-07-13 NOTE — Progress Notes (Signed)
Subjective:  Patient ID: [name], female    DOB: July 07, 1956  Age: 64 y.o. MRN: [id number]  Chief Complaint  Patient presents with  . Hypotension  . Urinary Tract Infection    HPI: patient still having low BP with orthostatic symptoms, she has stopped some of her diuretic, her aldosterone was 2.5 low normal, addison's is a possibility and we plan to try fludocortisone.   Current Outpatient Medications on File Prior to Visit  Medication Sig Dispense Refill  . aspirin 81 MG tablet Take 81 mg by mouth daily.      Marland Kitchen BREO ELLIPTA 100-25 MCG/INH AEPB INHALE ONE PUFF INTO THE LUNG ONCE DAILY 28 each 4  . Buprenorphine HCl (BELBUCA) 900 MCG FILM Place 900 mcg inside cheek every 12 (twelve) hours.    . carvedilol (COREG) 3.125 MG tablet Take 1 tablet (3.125 mg total) by mouth 2 (two) times daily. 180 tablet 2  . celecoxib (CELEBREX) 200 MG capsule TAKE ONE CAPSULE BY MOUTH EVERY MORNING 90 capsule 2  . clobetasol ointment (TEMOVATE) 0.05 % Apply topically.    . cyclobenzaprine (FLEXERIL) 10 MG tablet Take 10 mg by mouth 3 (three) times daily as needed for muscle spasms.    Marland Kitchen denosumab (PROLIA) 60 MG/ML SOSY injection Inject 60 mg into the skin every 6 (six) months. 180 mL 2  . diazepam (VALIUM) 5 MG tablet TAKE ONE TABLET BY MOUTH THREE times A DAY AS NEEDED 30 tablet 2  . diphenoxylate-atropine (LOMOTIL) 2.5-0.025 MG tablet Take 1 tablet by mouth 4 (four) times daily as needed for diarrhea or loose stools. 30 tablet 0  . ENTRESTO 24-26 MG TAKE ONE TABLET BY MOUTH EVERY MORNING and TAKE ONE TABLET BY MOUTH EVERY EVENING 180 tablet 2  . EPINEPHRINE 0.3 mg/0.3 mL IJ SOAJ injection Inject 0.3 mLs (0.3 mg total) into the muscle as needed for anaphylaxis. 1 each 2  . esomeprazole (NEXIUM) 20 MG capsule TAKE 1 CAPSULE BY MOUTH DAILY 90 capsule 2  . fluticasone (FLONASE) 50 MCG/ACT nasal spray Place 2 sprays into both nostrils daily as needed (congestion). 18 mL 2  . furosemide (LASIX) 40 MG  tablet TAKE ONE TABLET BY MOUTH EVERY MORNING MAY take 0.5 tablet in THE afternoon IF needed 90 tablet 2  . gabapentin (NEURONTIN) 800 MG tablet Take 1 tablet (800 mg total) by mouth 3 (three) times daily. Patient usually takes twice a day 270 tablet 2  . ipratropium (ATROVENT) 0.02 % nebulizer solution Take 2.5 mLs (0.5 mg total) by nebulization every 4 (four) hours as needed for wheezing or shortness of breath. 150 mL 2  . Ketoprofen 25 MG CAPS Take by mouth.    . levocetirizine (XYZAL) 5 MG tablet Take 5 mg by mouth daily as needed.    . Multiple Vitamins-Minerals (AIRBORNE GUMMIES PO) Take 3 tablets by mouth daily in the afternoon.    . nitroGLYCERIN (NITROSTAT) 0.4 MG SL tablet Place 1 tablet (0.4 mg total) under the tongue every 5 (five) minutes as needed for chest pain. 25 tablet 6  . ondansetron (ZOFRAN) 4 MG tablet Take 1 tablet (4 mg total) by mouth every 8 (eight) hours as needed for nausea or vomiting. 20 tablet 0  . Oxycodone HCl 10 MG TABS Take 1 tablet by mouth every 4 (four) hours as needed.    . ranolazine (RANEXA) 500 MG 12 hr tablet Take 1 tablet (500 mg total) by mouth 2 (two) times daily. 180 tablet 2  . rosuvastatin (  CRESTOR) 20 MG tablet TAKE 1 TABLET(20 MG) BY MOUTH DAILY 90 tablet 2  . sertraline (ZOLOFT) 50 MG tablet TAKE 1 TABLET BY MOUTH DAILY 90 tablet 2  . tiZANidine (ZANAFLEX) 4 MG tablet Take 4 mg by mouth at bedtime.    . topiramate (TOPAMAX) 50 MG tablet Take 2 tablets (100 mg total) by mouth at bedtime. 240 tablet 2  . Ubrogepant (UBRELVY) 100 MG TABS Take 1 tablet by mouth as needed (May repeat 1 tablet after 2 hours if needed.  Maximum 2 tablets in 24 hours). 16 tablet 6   No current facility-administered medications on file prior to visit.   Past Medical History:  Diagnosis Date  . Admission for long-term opiate analgesic use 10/24/2019  . Arthritis of right hip 05/29/2016   Formatting of this note might be different from the original. Added automatically  from request for surgery [phone]  . Cardiomyopathy [facility])    Overview:  Ejection fraction 45% in 2015 Ejection fraction 30 to 35% in November 2018  . Chronic back pain   . Chronic hypoxemic respiratory failure ([place]) 10/24/2019  . Chronic narcotic use 03/23/2015  . Chronic pain of both knees 12/21/2018   Added automatically from request for surgery [id number]  Formatting of this note might be different from the original. Added automatically from request for surgery [phone]  . Chronic systolic congestive heart failure, NYHA class 2 ([place]) 06/19/2017  . COPD (chronic obstructive pulmonary disease) ([place])   . Coronary artery disease involving native coronary artery of native heart without angina pectoris 05/31/2015   Overview:  Abnormal stress test in fall of 2016, cardiac catheterization showed normal coronaries.  . Dilated cardiomyopathy ([place]) 06/19/2017  . 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 Dyslipidemia 05/31/2015  . Essential hypertension 05/31/2015  . GERD (gastroesophageal reflux disease)   . ICD (implantable cardiac defibrillator) in place 2012  . Ileus following gastrointestinal surgery ([place]) 03/26/2015  . Major depressive disorder, single episode, moderate ([place]) 10/24/2019  . Migraine without aura with status migrainosus 10/24/2019  . Nausea alone 04/19/2014  . Obstructive chronic bronchitis with exacerbation ([place]) 10/25/2019  . Other spondylosis with radiculopathy, lumbar region 10/24/2019  . Presence of left artificial hip joint 10/24/2019  . PTSD (post-traumatic stress disorder)   . 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 Senile osteoporosis 10/24/2019  . Thyroid disease    Past Surgical History:  Procedure Laterality Date  . APPENDECTOMY    . CARDIAC CATHETERIZATION    . CARPAL TUNNEL RELEASE    . CESAREAN SECTION    . CHOLECYSTECTOMY    . CORONARY ANGIOPLASTY    .  HAND SURGERY    . ICD GENERATOR CHANGEOUT N/A 05/21/2019   Procedure: ICD GENERATOR CHANGEOUT;  Surgeon: Constance Haw, MD;  Location: [place] [facility];  Service: Cardiovascular;  Laterality: N/A;  . ICD IMPLANT     [employer]  . intestinal blockage 2011    . LAPAROSCOPIC ASSISTED VENTRAL HERNIA REPAIR N/A 03/23/2015   Procedure: LAPAROSCOPIC VENTRAL WALL HERNIA REPAIR;  Surgeon: Michael Boston, MD;  Location: [facility];  Service: General;  Laterality: N/A;  With MESH  . LAPAROSCOPIC LYSIS OF ADHESIONS N/A 03/23/2015   Procedure: LAPAROSCOPIC LYSIS OF ADHESIONS;  Surgeon: Michael Boston, MD;  Location: [facility];  Service: General;  Laterality: N/A;  . NECK SURGERY     fused  . TONSILLECTOMY    . ULNAR  NERVE TRANSPOSITION  01/23/2012   Procedure: ULNAR NERVE DECOMPRESSION/TRANSPOSITION;  Surgeon: Ophelia Charter, MD;  Location: [place] [facility];  Service: Neurosurgery;  Laterality: Left;  LEFT ulnar nerve decompression    Family History  Problem Relation Age of Onset  . Heart attack Other   . Cancer Other   . Heart failure Other   . Hypertension Mother   . Heart attack Mother   . Alcohol abuse Father   . Hypertension Father   . Heart attack Father   . Anesthesia problems Neg Hx   . Hypotension Neg Hx   . Malignant hyperthermia Neg Hx   . Pseudochol deficiency Neg Hx    Social History   Socioeconomic History  . Marital status: Single    Spouse name: Not on file  . Number of children: 3  . Years of education: Not on file  . Highest education level: Not on file  Occupational History  . Occupation: disabled  Tobacco Use  . Smoking status: Former Smoker    Packs/day: 0.50    Years: 30.00    Pack years: 15.00    Types: Cigarettes    Quit date: 03/2020    Years since quitting: 0.2  . Smokeless tobacco: Never Used  Vaping Use  . Vaping Use: Never used  Substance and Sexual Activity  . Alcohol use: Yes    Alcohol/week: 2.0 standard drinks    Types: 1 Cans of beer, 1 Standard  drinks or equivalent per week    Comment: seldom  . Drug use: No  . Sexual activity: Not Currently  Other Topics Concern  . Not on file  Social History Narrative   One level home with boyfriend   Caffeine - coffee 1-2 cups/day; Green tea 4-5 bottles a day   Exercise - some    Right handed      Social Determinants of Health   Financial Resource Strain:   . Difficulty of Paying Living Expenses: Not on file  Food Insecurity:   . Worried About Charity fundraiser in the Last Year: Not on file  . Ran Out of Food in the Last Year: Not on file  Transportation Needs:   . Lack of Transportation (Medical): Not on file  . Lack of Transportation (Non-Medical): Not on file  Physical Activity:   . Days of Exercise per Week: Not on file  . Minutes of Exercise per Session: Not on file  Stress:   . Feeling of Stress : Not on file  Social Connections:   . Frequency of Communication with Friends and Family: Not on file  . Frequency of Social Gatherings with Friends and Family: Not on file  . Attends Religious Services: Not on file  . Active Member of Clubs or Organizations: Not on file  . Attends Archivist Meetings: Not on file  . Marital Status: Not on file    Review of Systems  Constitutional: Negative for activity change, appetite change, fever and unexpected weight change.  HENT: Positive for sinus pressure. Negative for congestion, dental problem and sinus pain.   Eyes: Negative.   Respiratory: Positive for apnea and chest tightness.   Cardiovascular: Negative for chest pain, palpitations and leg swelling.  Gastrointestinal: Negative.   Genitourinary: Positive for difficulty urinating and dysuria.  Musculoskeletal: Negative.   Neurological: Negative.   Psychiatric/Behavioral: Negative.      Objective:  BP (!) 82/52   Pulse 93   Temp (!) 94.8 F (34.9 C)   Resp  16   Ht 5' (1.524 m)   Wt 129 lb (58.5 kg)   SpO2 93%   BMI 25.19 kg/m   BP/Weight 07/13/2020  07/05/2020 7/51/0258  Systolic BP 82 78 90  Diastolic BP 52 50 60  Wt. (Lbs) 129 130.6 131  BMI 25.19 25.51 25.58    Physical Exam Vitals reviewed.  Constitutional:      Appearance: Normal appearance.  HENT:     Right Ear: Tympanic membrane, ear canal and external ear normal.     Left Ear: Tympanic membrane, ear canal and external ear normal.     Mouth/Throat:     Mouth: Mucous membranes are dry.     Pharynx: Oropharynx is clear.  Eyes:     Extraocular Movements: Extraocular movements intact.     Conjunctiva/sclera: Conjunctivae normal.     Pupils: Pupils are equal, round, and reactive to light.  Cardiovascular:     Rate and Rhythm: Normal rate and regular rhythm.     Pulses: Normal pulses.     Heart sounds: Normal heart sounds.  Pulmonary:     Effort: Pulmonary effort is normal.     Breath sounds: Normal breath sounds.  Abdominal:     General: Abdomen is flat. Bowel sounds are normal.     Palpations: Abdomen is soft.  Musculoskeletal:        General: Normal range of motion.     Cervical back: Normal range of motion and neck supple.  Skin:    General: Skin is warm and dry.     Capillary Refill: Capillary refill takes less than 2 seconds.  Neurological:     General: No focal deficit present.     Mental Status: She is alert and oriented to person, place, and time. Mental status is at baseline.       Lab Results  Component Value Date   WBC 11.0 (H) 07/05/2020   HGB 10.9 (L) 07/05/2020   HCT 32.8 (L) 07/05/2020   PLT 324 07/05/2020   GLUCOSE 90 07/05/2020   CHOL 141 07/05/2020   TRIG 127 07/05/2020   HDL 46 07/05/2020   LDLCALC 72 07/05/2020   ALT 6 07/05/2020   AST 14 07/05/2020   NA 142 07/05/2020   K 4.3 07/05/2020   CL 105 07/05/2020   CREATININE 1.37 (H) 07/05/2020   BUN 16 07/05/2020   CO2 25 07/05/2020   TSH 0.987 07/05/2020      Assessment & Plan:   1. Suprapubic pain - POCT URINALYSIS DIP (CLINITEK) Patient has UTI and will be  treated.  2. 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  Dispense: 30 tablet; Refill: 3 Patient has symptoms for hyp[oaldosteroneism and is hypotensive.  Try flourinef 3 times a week.  Watch for too much fluid or electorly changes. 3. Cystitis - ciprofloxacin (CIPRO) 250 MG tablet; Take 1 tablet (250 mg total) by mouth 2 (two) times daily.  Dispense: 14 tablet; Refill: 0 - Urine Culture      Orders Placed This Encounter  Procedures  . Urine Culture  . POCT URINALYSIS DIP (CLINITEK)     I spent 20 minutes dedicated to the care of this patient on the date of this encounter to include face-to-face time with the patient, as well as: Preparing to see the patient (review of tests). Obtaining and/or reviewing separately obtained history. Performing a medically appropriate examination and/or evaluation. Counseling and educating the patient/family/caregiver. Ordering medications, tests,  or procedures. Documenting clinical information in the electronic or other health record. Independently interpreting results (not separately reported) and communicating results to the patient/family/caregiver.  My nursing staff have aided in the documentation of this note on the behalf of Reinaldo Meeker, MD,as directed by  Reinaldo Meeker, MD and thoroughly reviewed by Reinaldo Meeker, MD.  Follow-up: Return in about 1 month (around 08/12/2020) for for bp.  An After Visit Summary was printed and given to the patient.  Reinaldo Meeker, MD [facility] [phone]

## 2020-07-15 LAB — URINE CULTURE

## 2020-07-16 NOTE — Progress Notes (Signed)
Urine culture negative ?lp

## 2020-07-18 ENCOUNTER — Other Ambulatory Visit: Payer: Self-pay

## 2020-07-18 ENCOUNTER — Ambulatory Visit: Payer: Medicare Other

## 2020-07-18 DIAGNOSIS — E782 Mixed hyperlipidemia: Secondary | ICD-10-CM

## 2020-07-18 DIAGNOSIS — J449 Chronic obstructive pulmonary disease, unspecified: Secondary | ICD-10-CM

## 2020-07-18 NOTE — Patient Instructions (Addendum)
Visit Information  Goals Addressed            This Visit's Progress   . Pharmacy Care Plan       CARE PLAN ENTRY  Current Barriers:  . Chronic Disease Management support, education, and care coordination needs related to Hypertension and Hyperlipidemia   Heart Failure . Pharmacist Clinical Goal(s): o Over the next 90 days, patient will work with PharmD and providers to avoid heart failure exacerbation. . Current regimen:  o Entresto 24-26 mg bid  o furosemide 40 mg daily and 1/2 tablet in the afternoon if needed o carvedilol 3.125 mg bid o Florinef 0.1 mg three times weekly to treat hypotension . Interventions: o Recommend patient continue to limit salt in diet to reduce swelling and keep good blood pressure control.  o Recommend patient continue to limit fluid intake as advised by cardiologist.  . Patient self care activities - Over the next 90 days, patient will: o Check BP monthly, document, and provide at future appointments o Ensure daily salt intake < 2300 mg/day  Hyperlipidemia . Pharmacist Clinical Goal(s): o Over the next 90 days, patient will work with PharmD and providers to maintain LDL goal < 100 . Current regimen:  o Rosuvastatin 20 mg daily . Interventions: o Recommend patient take rosuvastatin as prescribed.  o Recommend patient continue to incorporate vegetables and lean protein sources in diet.  . Patient self care activities - Over the next 90 days, patient will: o Take medications as prescribed, eat a heart-healthy diet and contact provider with any questions/concerns.   COPD . Pharmacist Clinical Goal(s) o Over the next 90 days, patient will work with PharmD and providers to reduce use of rescue inhaler by using maintenance inhaler daily.  . Current regimen:  o  Breo Ellipta 100/26 mcg/inh 1 puff in lungs daily  o Atrovent nebulizer solutions every 4 hours prn wheezing or shortness of breath . Interventions: o Patient indicates good symptoms control  currently.  . Patient self care activities - Over the next 90 days, patient will: o Use Breo daily to manage symptoms and contact provider as needed.   Medication management . Pharmacist Clinical Goal(s): o Over the next 90 days, patient will work with PharmD and providers to maintain optimal medication adherence . Current pharmacy:[facility] . Interventions o Comprehensive medication review performed. o Utilize [facility] pharmacy for medication synchronization, packaging and delivery . Patient self care activities - Over the next 90 days, patient will: o Focus on medication adherence by enrolling in packaging and delivery service.  o Take medications as prescribed o Report any questions or concerns to PharmD and/or provider(s)  Please see past updates related to this goal by clicking on the "Past Updates" button in the selected goal         The patient verbalized understanding of instructions, educational materials, and care plan provided today and declined offer to receive copy of patient instructions, educational materials, and care plan.   Telephone follow up appointment with pharmacy team member scheduled for: 09/2020  Sherre Poot, PharmD, [facility] Clinical Pharmacist [facility] [phone] (office) [phone] (mobile)   Criss Rosales Diet A bland diet consists of foods that are often soft and do not have a lot of fat, fiber, or extra seasonings. Foods without fat, fiber, or seasoning are easier for the body to digest. They are also less likely to irritate your mouth, throat, stomach, and other parts of your digestive system. A bland diet is sometimes called a [place]  diet. What is my plan? Your health care provider or food and nutrition specialist (dietitian) may recommend specific changes to your diet to prevent symptoms or to treat your symptoms. These changes may include:  Eating small meals often.  Cooking food until it is soft enough to chew easily.  Chewing your  food well.  Drinking fluids slowly.  Not eating foods that are very spicy, sour, or fatty.  Not eating citrus fruits, such as oranges and grapefruit. What do I need to know about this diet?  Eat a variety of foods from the bland diet food list.  Do not follow a bland diet longer than needed.  Ask your health care provider whether you should take vitamins or supplements. What foods can I eat? Grains  Hot cereals, such as cream of wheat. Rice. Bread, crackers, or tortillas made from refined white flour. Vegetables Canned or cooked vegetables. Mashed or boiled potatoes. Fruits  Bananas. Applesauce. Other types of cooked or canned fruit with the skin and seeds removed, such as canned peaches or pears. Meats and other proteins  Scrambled eggs. Creamy peanut butter or other nut butters. Lean, well-cooked meats, such as chicken or fish. Tofu. Soups or broths. Dairy Low-fat dairy products, such as milk, cottage cheese, or yogurt. Beverages  Water. Herbal tea. Apple juice. Fats and oils Mild salad dressings. Canola or olive oil. Sweets and desserts Pudding. Custard. Fruit gelatin. Ice cream. The items listed above may not be a complete list of recommended foods and beverages. Contact a dietitian for more options. What foods are not recommended? Grains Whole grain breads and cereals. Vegetables Raw vegetables. Fruits Raw fruits, especially citrus, berries, or dried fruits. Dairy Whole fat dairy foods. Beverages Caffeinated drinks. Alcohol. Seasonings and condiments Strongly flavored seasonings or condiments. Hot sauce. Salsa. Other foods Spicy foods. Fried foods. Sour foods, such as pickled or fermented foods. Foods with high sugar content. Foods high in fiber. The items listed above may not be a complete list of foods and beverages to avoid. Contact a dietitian for more information. Summary  A bland diet consists of foods that are often soft and do not have a lot of fat,  fiber, or extra seasonings.  Foods without fat, fiber, or seasoning are easier for the body to digest.  Check with your health care provider to see how long you should follow this diet plan. It is not meant to be followed for long periods. This information is not intended to replace advice given to you by your health care provider. Make sure you discuss any questions you have with your health care provider. Document Revised: 09/10/2017 Document Reviewed: 09/10/2017 Elsevier Patient Education  2020 [employer].

## 2020-07-18 NOTE — Chronic Care Management (AMB) (Signed)
Chronic Care Management Pharmacy  Name: [name]  MRN: [id number] DOB: 07-Dec-1955  Chief Complaint/ HPI  Plan Recommendations:   Patient reports symptoms of cystitis are improving with treatment but diarrhea has returned. She had several episodes yesterday. I'm happy to call her with any recommendations.   [name],  63 y.o. , female presents for their Follow-Up CCM visit with the clinical pharmacist via telephone due to COVID-19 Pandemic.  PCP : Lillard Anes, MD  Their chronic conditions include: HTN, COPD, Cardiomyopathy, GERD, Osteoporosis, Chronic back pain, HLD.   Office Visits: 07/13/2020 - Florinef three times weekly for hypoaldosteronism and hypotensive. Cipro for Cystitis.  07/05/2020 - Aldosterone low normal, wbc 11,000 mostly neurophils down from 20,000 Infection. Continued anemia, creatinine rising, liver tests normal, Cholesterol normal, TSH 0.987 normal. Stope afternoon furosemide.  05/19/2020 - patient drinking fluids for hydration. Refuses ED. Less diarrhea and improving each day.  05/17/2020 - dehydrated but refuses ED for rehydration. Persistent vomiting and diarrhea. Cystitis - IM rocephin, zofran and macrobid.  Consult Visit: 04/05/2020 - low dose of Entresto and Carvedilol due to hypotension.   Medications: Outpatient Encounter Medications as of 07/18/2020  Medication Sig  . aspirin 81 MG tablet Take 81 mg by mouth daily.    Marland Kitchen BREO ELLIPTA 100-25 MCG/INH AEPB INHALE ONE PUFF INTO THE LUNG ONCE DAILY  . Buprenorphine HCl (BELBUCA) 900 MCG FILM Place 900 mcg inside cheek every 12 (twelve) hours.  . carvedilol (COREG) 3.125 MG tablet Take 1 tablet (3.125 mg total) by mouth 2 (two) times daily.  . celecoxib (CELEBREX) 200 MG capsule TAKE ONE CAPSULE BY MOUTH EVERY MORNING  . ciprofloxacin (CIPRO) 250 MG tablet Take 1 tablet (250 mg total) by mouth 2 (two) times daily.  . clobetasol ointment (TEMOVATE) 0.05 % Apply topically.  .  cyclobenzaprine (FLEXERIL) 10 MG tablet Take 10 mg by mouth 3 (three) times daily as needed for muscle spasms.  Marland Kitchen denosumab (PROLIA) 60 MG/ML SOSY injection Inject 60 mg into the skin every 6 (six) months.  . diazepam (VALIUM) 5 MG tablet TAKE ONE TABLET BY MOUTH THREE times A DAY AS NEEDED  . diphenoxylate-atropine (LOMOTIL) 2.5-0.025 MG tablet Take 1 tablet by mouth 4 (four) times daily as needed for diarrhea or loose stools.  Marland Kitchen ENTRESTO 24-26 MG TAKE ONE TABLET BY MOUTH EVERY MORNING and TAKE ONE TABLET BY MOUTH EVERY EVENING  . EPINEPHRINE 0.3 mg/0.3 mL IJ SOAJ injection Inject 0.3 mLs (0.3 mg total) into the muscle as needed for anaphylaxis.  Marland Kitchen esomeprazole (NEXIUM) 20 MG capsule TAKE 1 CAPSULE BY MOUTH DAILY  . fludrocortisone (FLORINEF) 0.1 MG tablet Take 1 tablet (0.1 mg total) by mouth as directed.  . fluticasone (FLONASE) 50 MCG/ACT nasal spray Place 2 sprays into both nostrils daily as needed (congestion).  . furosemide (LASIX) 40 MG tablet TAKE ONE TABLET BY MOUTH EVERY MORNING MAY take 0.5 tablet in THE afternoon IF needed  . gabapentin (NEURONTIN) 800 MG tablet Take 1 tablet (800 mg total) by mouth 3 (three) times daily. Patient usually takes twice a day  . ipratropium (ATROVENT) 0.02 % nebulizer solution Take 2.5 mLs (0.5 mg total) by nebulization every 4 (four) hours as needed for wheezing or shortness of breath.  . Ketoprofen 25 MG CAPS Take by mouth.  . levocetirizine (XYZAL) 5 MG tablet Take 5 mg by mouth daily as needed.  . Multiple Vitamins-Minerals (AIRBORNE GUMMIES PO) Take 3 tablets by mouth daily in the afternoon.  Marland Kitchen  nitroGLYCERIN (NITROSTAT) 0.4 MG SL tablet Place 1 tablet (0.4 mg total) under the tongue every 5 (five) minutes as needed for chest pain.  Marland Kitchen ondansetron (ZOFRAN) 4 MG tablet Take 1 tablet (4 mg total) by mouth every 8 (eight) hours as needed for nausea or vomiting.  . Oxycodone HCl 10 MG TABS Take 1 tablet by mouth every 4 (four) hours as needed.  . ranolazine  (RANEXA) 500 MG 12 hr tablet Take 1 tablet (500 mg total) by mouth 2 (two) times daily.  . rosuvastatin (CRESTOR) 20 MG tablet TAKE 1 TABLET(20 MG) BY MOUTH DAILY  . sertraline (ZOLOFT) 50 MG tablet TAKE 1 TABLET BY MOUTH DAILY  . tiZANidine (ZANAFLEX) 4 MG tablet Take 4 mg by mouth at bedtime.  . topiramate (TOPAMAX) 50 MG tablet Take 2 tablets (100 mg total) by mouth at bedtime.  Marland Kitchen Ubrogepant (UBRELVY) 100 MG TABS Take 1 tablet by mouth as needed (May repeat 1 tablet after 2 hours if needed.  Maximum 2 tablets in 24 hours).   No facility-administered encounter medications on file as of 07/18/2020.   No Known Allergies  SDOH Screenings   Alcohol Screen:   . Last Alcohol Screening Score (AUDIT): Not on file  Depression (PHQ2-9): Medium Risk  . PHQ-2 Score: 20  Financial Resource Strain:   . Difficulty of Paying Living Expenses: Not on file  Food Insecurity:   . Worried About Charity fundraiser in the Last Year: Not on file  . Ran Out of Food in the Last Year: Not on file  Housing:   . Last Housing Risk Score: Not on file  Physical Activity:   . Days of Exercise per Week: Not on file  . Minutes of Exercise per Session: Not on file  Social Connections:   . Frequency of Communication with Friends and Family: Not on file  . Frequency of Social Gatherings with Friends and Family: Not on file  . Attends Religious Services: Not on file  . Active Member of Clubs or Organizations: Not on file  . Attends Archivist Meetings: Not on file  . Marital Status: Not on file  Stress:   . Feeling of Stress : Not on file  Tobacco Use: Medium Risk  . Smoking Tobacco Use: Former Smoker  . Smokeless Tobacco Use: Never Used  Transportation Needs:   . Film/video editor (Medical): Not on file  . Lack of Transportation (Non-Medical): Not on file     Current Diagnosis/Assessment:  Goals Addressed            This Visit's Progress   . Pharmacy Care Plan       CARE PLAN  ENTRY  Current Barriers:  . Chronic Disease Management support, education, and care coordination needs related to Hypertension and Hyperlipidemia   Heart Failure . Pharmacist Clinical Goal(s): o Over the next 90 days, patient will work with PharmD and providers to avoid heart failure exacerbation. . Current regimen:  o Entresto 24-26 mg bid  o furosemide 40 mg daily and 1/2 tablet in the afternoon if needed o carvedilol 3.125 mg bid o Florinef 0.1 mg three times weekly to treat hypotension . Interventions: o Recommend patient continue to limit salt in diet to reduce swelling and keep good blood pressure control.  o Recommend patient continue to limit fluid intake as advised by cardiologist.  . Patient self care activities - Over the next 90 days, patient will: o Check BP monthly, document, and provide at future  appointments o Ensure daily salt intake < 2300 mg/day  Hyperlipidemia . Pharmacist Clinical Goal(s): o Over the next 90 days, patient will work with PharmD and providers to maintain LDL goal < 100 . Current regimen:  o Rosuvastatin 20 mg daily . Interventions: o Recommend patient take rosuvastatin as prescribed.  o Recommend patient continue to incorporate vegetables and lean protein sources in diet.  . Patient self care activities - Over the next 90 days, patient will: o Take medications as prescribed, eat a heart-healthy diet and contact provider with any questions/concerns.   COPD . Pharmacist Clinical Goal(s) o Over the next 90 days, patient will work with PharmD and providers to reduce use of rescue inhaler by using maintenance inhaler daily.  . Current regimen:  o  Breo Ellipta 100/26 mcg/inh 1 puff in lungs daily  o Atrovent nebulizer solutions every 4 hours prn wheezing or shortness of breath . Interventions: o Patient indicates good symptoms control currently.  . Patient self care activities - Over the next 90 days, patient will: o Use Breo daily to manage  symptoms and contact provider as needed.   Medication management . Pharmacist Clinical Goal(s): o Over the next 90 days, patient will work with PharmD and providers to maintain optimal medication adherence . Current pharmacy:[facility] . Interventions o Comprehensive medication review performed. o Utilize [facility] pharmacy for medication synchronization, packaging and delivery . Patient self care activities - Over the next 90 days, patient will: o Focus on medication adherence by enrolling in packaging and delivery service.  o Take medications as prescribed o Report any questions or concerns to PharmD and/or provider(s)  Please see past updates related to this goal by clicking on the "Past Updates" button in the selected goal        Heart Failure/HTN/Cardiomyopathy   Office blood pressures are  BP Readings from Last 3 Encounters:  12/20/19 100/60  12/02/19 112/64  10/25/19 643/32    Type: Systolic  Last ejection fraction: 35-40% NYHA Class: II (slight limitation of activity)  Patient has failed these meds in past: spironolactone Patient is currently uncontrolled on the following medications:   Entresto 24-26 mg bid  furosemide 40 daily and 1/2 in the afternoon if needed  nitroglycerin 0.4 mg prn  carvedilol 3.125 mg bid  ranexa 500 mg bid  We discussed diet and exercise extensively. Discussed any change in blood pressure since beginning florinef last week. Blood pressure remained "low" at home on Friday and Saturday. Hasn't checked since then. Is taking care of grandchildren due to son-in-law in hospital after foot injury. Plans to check today.   Plan  Continue current medications    Hyperlipidemia   Lipid Panel     Component Value Date/Time   CHOL 141 07/05/2020 1214   TRIG 127 07/05/2020 1214   HDL 46 07/05/2020 1214   CHOLHDL 3.1 07/05/2020 1214   LDLCALC 72 07/05/2020 1214   LABVLDL 23 07/05/2020 1214     The ASCVD Risk score (Goff DC Jr., et al.,  2013) failed to calculate for the following reasons:   The valid systolic blood pressure range is 90 to 200 mmHg   Patient has failed these meds in past: n/a Patient is currently controlled on the following medications:   aspirin 81 mg daily  rosuvastatin 20 mg daily  We discussed:  diet and exercise extensively. Patient reports good adherence to medication. Patient receiving medication through delivery.    Plan  Continue current medications   COPD / Asthma /  Tobacco   Eosinophil count:   Lab Results  Component Value Date/Time   EOSPCT 1 03/26/2015 12:30 PM  %                               Eos (Absolute):  Lab Results  Component Value Date/Time   EOSABS 0.3 07/05/2020 12:14 PM    Tobacco Status:  Social History   Tobacco Use  Smoking Status Former Smoker  . Packs/day: 0.50  . Years: 30.00  . Pack years: 15.00  . Types: Cigarettes  . Quit date: 03/2020  . Years since quitting: 0.3  Smokeless Tobacco Never Used    Patient has failed these meds in past: advair  Patient is currently uncontrolled on the following medications:   flonase 50 mcg 2 sprays both nostrils daily prn  Breo Ellipta 100-25 mcg/inh 1 puff in lungs daily   atrovent neb solution q4h prn wheezing/sob  levocetirizine 5 mg daily prn  Using maintenance inhaler regularly? No Frequency of rescue inhaler use:  multiple times per day  We discussed: Breathing is stable on current regimen per patient.   Plan  Continue current medications ,    Health Maintenance   Patient reports diarrhea is back. She reports it came back yesterday. It is not a lot like before. It just comes. Still having pain with urination but better than it was. Will consult with Dr. Henrene Pastor to see if he has any recommendations.   Medication Management   Pt uses [facility] for all medications Uses pill box? Yes Pt endorses good compliance  We discussed: Patient would benefit from packaging and delivery. She  would like to get started with both services.   Plan  Utilize [facility] pharmacy for medication synchronization, packaging and delivery   Verbal consent obtained for [facility] enhanced pharmacy services (medication synchronization, adherence packaging, delivery coordination). A medication sync plan was created to allow patient to get all medications delivered once every 30 to 90 days per patient preference. Patient understands they have freedom to choose pharmacy and clinical pharmacist will coordinate care between all prescribers and [facility].   Follow up: 3 month phone visit

## 2020-07-22 NOTE — Chronic Care Management (AMB) (Signed)
Chronic Care Management Pharmacy Assistant   Name: [name]  MRN: [id number] DOB: [birth year]  Reason for Encounter: Medication Review  Patient Questions:  1.  Have you seen any other providers since your last visit? Yes, 07/05/2020- Dr. Henrene Pastor (PCP), 07/13/2020- Dr. Henrene Pastor (PCP), 07/18/2020- Luz Lex, [facility] (CCM).  2.  Any changes in your medicines or health? Yes, 07/13/2020- Ciprofloxacin 250 mg- 2 times daily and Fludrocortisone 0.1 mg added. Nitrofurantoin 100 mg discontinued.    PCP : Lillard Anes, MD  Allergies:  No Known Allergies  Medications: Outpatient Encounter Medications as of 07/24/2020  Medication Sig  . aspirin 81 MG tablet Take 81 mg by mouth daily.    Marland Kitchen BREO ELLIPTA 100-25 MCG/INH AEPB INHALE ONE PUFF INTO THE LUNG ONCE DAILY  . Buprenorphine HCl (BELBUCA) 900 MCG FILM Place 900 mcg inside cheek every 12 (twelve) hours.  . carvedilol (COREG) 3.125 MG tablet Take 1 tablet (3.125 mg total) by mouth 2 (two) times daily.  . celecoxib (CELEBREX) 200 MG capsule TAKE ONE CAPSULE BY MOUTH EVERY MORNING  . ciprofloxacin (CIPRO) 250 MG tablet Take 1 tablet (250 mg total) by mouth 2 (two) times daily.  . clobetasol ointment (TEMOVATE) 0.05 % Apply topically.  . cyclobenzaprine (FLEXERIL) 10 MG tablet Take 10 mg by mouth 3 (three) times daily as needed for muscle spasms.  Marland Kitchen denosumab (PROLIA) 60 MG/ML SOSY injection Inject 60 mg into the skin every 6 (six) months.  . diazepam (VALIUM) 5 MG tablet TAKE ONE TABLET BY MOUTH THREE times A DAY AS NEEDED  . diphenoxylate-atropine (LOMOTIL) 2.5-0.025 MG tablet Take 1 tablet by mouth 4 (four) times daily as needed for diarrhea or loose stools.  Marland Kitchen ENTRESTO 24-26 MG TAKE ONE TABLET BY MOUTH EVERY MORNING and TAKE ONE TABLET BY MOUTH EVERY EVENING  . EPINEPHRINE 0.3 mg/0.3 mL IJ SOAJ injection Inject 0.3 mLs (0.3 mg total) into the muscle as needed for anaphylaxis.  Marland Kitchen esomeprazole (NEXIUM) 20 MG capsule TAKE 1 CAPSULE BY  MOUTH DAILY  . fludrocortisone (FLORINEF) 0.1 MG tablet Take 1 tablet (0.1 mg total) by mouth as directed.  . fluticasone (FLONASE) 50 MCG/ACT nasal spray Place 2 sprays into both nostrils daily as needed (congestion).  . furosemide (LASIX) 40 MG tablet TAKE ONE TABLET BY MOUTH EVERY MORNING MAY take 0.5 tablet in THE afternoon IF needed  . gabapentin (NEURONTIN) 800 MG tablet Take 1 tablet (800 mg total) by mouth 3 (three) times daily. Patient usually takes twice a day  . ipratropium (ATROVENT) 0.02 % nebulizer solution Take 2.5 mLs (0.5 mg total) by nebulization every 4 (four) hours as needed for wheezing or shortness of breath.  . Ketoprofen 25 MG CAPS Take by mouth.  . levocetirizine (XYZAL) 5 MG tablet Take 5 mg by mouth daily as needed.  . Multiple Vitamins-Minerals (AIRBORNE GUMMIES PO) Take 3 tablets by mouth daily in the afternoon.  . nitroGLYCERIN (NITROSTAT) 0.4 MG SL tablet Place 1 tablet (0.4 mg total) under the tongue every 5 (five) minutes as needed for chest pain.  Marland Kitchen ondansetron (ZOFRAN) 4 MG tablet Take 1 tablet (4 mg total) by mouth every 8 (eight) hours as needed for nausea or vomiting.  . Oxycodone HCl 10 MG TABS Take 1 tablet by mouth every 4 (four) hours as needed.  . ranolazine (RANEXA) 500 MG 12 hr tablet Take 1 tablet (500 mg total) by mouth 2 (two) times daily.  . rosuvastatin (CRESTOR) 20 MG tablet TAKE 1  TABLET(20 MG) BY MOUTH DAILY  . sertraline (ZOLOFT) 50 MG tablet TAKE 1 TABLET BY MOUTH DAILY  . tiZANidine (ZANAFLEX) 4 MG tablet Take 4 mg by mouth at bedtime.  . topiramate (TOPAMAX) 50 MG tablet Take 2 tablets (100 mg total) by mouth at bedtime.  Marland Kitchen Ubrogepant (UBRELVY) 100 MG TABS Take 1 tablet by mouth as needed (May repeat 1 tablet after 2 hours if needed.  Maximum 2 tablets in 24 hours).   No facility-administered encounter medications on file as of 07/24/2020.    Current Diagnosis: Patient Active Problem List   Diagnosis Date Noted  . 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 Dehydration 05/17/2020  . Hypotension 05/17/2020  . Persistent vomiting 05/17/2020  . Diarrhea 05/17/2020  . Cystitis 05/17/2020  . BMI 26.0-26.9,adult 03/29/2020  . Obstructive chronic bronchitis with exacerbation ([place]) 10/25/2019  . Admission for long-term opiate analgesic use 10/24/2019  . Presence of left artificial hip joint 10/24/2019  . Senile osteoporosis 10/24/2019  . Chronic hypoxemic respiratory failure ([place]) 10/24/2019  . Other spondylosis with radiculopathy, lumbar region 10/24/2019  . Major depressive disorder, single episode, moderate ([place]) 10/24/2019  . Migraine without aura with status migrainosus 10/24/2019  . 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 Chronic pain of both knees 12/21/2018  . Chronic systolic congestive heart failure, NYHA class 2 ([place]) 06/19/2017  . Dilated cardiomyopathy ([place]) 06/19/2017  . 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 Arthritis of right hip 05/29/2016  . Coronary artery disease involving native coronary artery of native heart without angina pectoris 05/31/2015  . Dyslipidemia 05/31/2015  . Essential hypertension 05/31/2015  . Ileus following gastrointestinal surgery ([place]) 03/26/2015  . Chronic narcotic use 03/23/2015  . GERD (gastroesophageal reflux disease)   . 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 COPD (chronic obstructive pulmonary disease) ([employer])   . Cardiomyopathy ([place])   . Chronic back pain    Reviewed chart for medication changes ahead of medication coordination call.  OVs - 07/05/2020- Dr. Henrene Pastor (PCP), 07/13/2020- Dr. Henrene Pastor (PCP), 07/18/2020- Luz Lex, [facility] (CCM) since last care coordination call.  Medication changes indicated: 07/13/2020- Ciprofloxacin 250 mg- 2 times daily and Fludrocortisone 0.1 mg added. Nitrofurantoin 100 mg discontinued.    BP Readings from Last 3 Encounters:  07/13/20 (!) 82/52  07/05/20 (!) 78/50    05/19/20 90/60    No results found for: HGBA1C   Patient obtains medications through Vials  30 Days   Last adherence delivery included: Diazepam 5 mg - 1 tablet three times daily PRN  Entresto 24-26 mg -1 tablet twice daily  Rosuvastatin 20 mg -1 tablet daily Breo-Elipta100-25 MCG- Inhale one puff into lung once daily Ranolazine Er 500 MG- take 1 tablet by mouth 2 times daily   Patient declined the following medications last month:  Carvedilol 3.125 Mg- 1 tablet twice daily due to having an adequate supply from [facility] #80-90 on hand. Celecoxib 200 mg -1 tablet daily due to PRN use. Nexium 20 mg -1 capsule daily due to having an adequate supply on hand 3 bottles from [facility]. Furosemide 40 mg - 1 tablet daily in the morning and  tablet in the  afternoon as needed due to having an adequate supply on hand, starting 06/18/20 fill from [facility]. Used all from [facility] first. Gabapentin 800 mg -1 tablet twice daily due to having and adequate supply on hand, 2-3 bottles from [facility] Topiramate 100 mg -Patient is now taking 3 tablets at night, using  [facility] supply, completed 05/26/20 fill from [facility]. Sertraline 500 mg -1 tablet daily due to receiving a #90 from [place]. Nitroglycerin 0.4 mg -1 tablet under tongue every 5 minutes as neededdue to PRN use. Fluticasone 50 MG- Spray 2 sprays in both nostrils daily due to PRN use. Ipratropium0.02% - take 2.5Mls by nebulization every 4 hours as needed due to PRN use.  Alendronate 70 MG- take 1 tablet by mouth once weekly due to being discontinued, therapy changed to Prolia injections.  Xyzal 5 mg- take 5 MG by mouth daily as needed due PRN use.  Epinephrinedue to PRN use. Linzess 145 mcg- 1 tablet daily due to having an adequate supply on hand and uses PRN.  Patient is due for next adherence delivery on: 07/28/2020 . 07/24/2020-Called patient to review medications and coordinate delivery, no answer, left message to  return call.  07/25/2020- Called patient on home number, no answer, phone just rings. Called on mobile number, no answer, left message to return call.   Follow-Up:  Coordination of Enhanced Pharmacy Services and Pharmacist Review  No return call from patient, will retry 07/26/2020.  Donette Larry, CPP notified.  Pattricia Boss, [place] Pharmacist Assistant [phone]

## 2020-07-24 ENCOUNTER — Telehealth: Payer: Self-pay

## 2020-07-27 ENCOUNTER — Encounter: Payer: Self-pay | Admitting: Legal Medicine

## 2020-07-27 ENCOUNTER — Other Ambulatory Visit: Payer: Self-pay

## 2020-07-27 ENCOUNTER — Telehealth (INDEPENDENT_AMBULATORY_CARE_PROVIDER_SITE_OTHER): Payer: Medicare Other | Admitting: Legal Medicine

## 2020-07-27 VITALS — BP 115/76 | Temp 98.0°F | Ht 60.0 in | Wt 129.0 lb

## 2020-07-27 DIAGNOSIS — Z9189 Other specified personal risk factors, not elsewhere classified: Secondary | ICD-10-CM | POA: Diagnosis not present

## 2020-07-27 DIAGNOSIS — J019 Acute sinusitis, unspecified: Secondary | ICD-10-CM | POA: Diagnosis not present

## 2020-07-27 MED ORDER — AMOXICILLIN-POT CLAVULANATE 875-125 MG PO TABS: 1.0000 | ORAL_TABLET | Freq: Two times a day (BID) | ORAL | 0 refills | Status: DC

## 2020-07-27 MED ORDER — DIAZEPAM 5 MG PO TABS
5.0000 mg | ORAL_TABLET | Freq: Three times a day (TID) | ORAL | 3 refills | Status: DC | PRN
Start: 2020-07-27 — End: 2020-12-15

## 2020-07-27 NOTE — Progress Notes (Signed)
Virtual Visit via Telephone Note   This visit type was conducted due to national recommendations for restrictions regarding the COVID-19 Pandemic (e.g. social distancing) in an effort to limit this patient's exposure and mitigate transmission in our community.  Due to her co-morbid illnesses, this patient is at least at moderate risk for complications without adequate follow up.  This format is felt to be most appropriate for this patient at this time.  The patient did not have access to video technology/had technical difficulties with video requiring transitioning to audio format only (telephone).  All issues noted in this document were discussed and addressed.  No physical exam could be performed with this format.  Patient verbally consented to a telehealth visit.   Date:  07/27/2020   ID:  [name], DOB 11/02/1955, MRN [id number]  Patient Location: Home Provider Location: Office/Clinic  PCP:  Lillard Anes, MD   Evaluation Performed:  New Patient Evaluation  Chief Complaint:  Cough, congestion for 2 days.  History of Present Illness:    [name] is a 64 y.o. female with Cough, congestion for 2 days. No fever, cough productive, sinus pain.  Patient has multiple medical comorbidities including a cardiomyopathy and has a history of congestive heart failure is presently class II.  She also has coronary artery disease and chronic hypoxemia respiratory failure from COPD.  She is at extremely high risk for Covid infection and hospitalization.  The patient does have symptoms concerning for COVID-19 infection (fever, chills, cough, or new shortness of breath).    Past Medical History:  Diagnosis Date  . Admission for long-term opiate analgesic use 10/24/2019  . Arthritis of right hip 05/29/2016   Formatting of this note might be different from the original. Added automatically from request for surgery [phone]  . Cardiomyopathy [facility])    Overview:  Ejection fraction 45% in  2015 Ejection fraction 30 to 35% in November 2018  . Chronic back pain   . Chronic hypoxemic respiratory failure ([place]) 10/24/2019  . Chronic narcotic use 03/23/2015  . Chronic pain of both knees 12/21/2018   Added automatically from request for surgery [id number]  Formatting of this note might be different from the original. Added automatically from request for surgery [id number]  . Chronic systolic congestive heart failure, NYHA class 2 ([place]) 06/19/2017  . COPD (chronic obstructive pulmonary disease) ([place])   . Coronary artery disease involving native coronary artery of native heart without angina pectoris 05/31/2015   Overview:  Abnormal stress test in fall of 2016, cardiac catheterization showed normal coronaries.  . Dilated cardiomyopathy ([place]) 06/19/2017  . 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 Dyslipidemia 05/31/2015  . Essential hypertension 05/31/2015  . GERD (gastroesophageal reflux disease)   . ICD (implantable cardiac defibrillator) in place 2012  . Ileus following gastrointestinal surgery ([place]) 03/26/2015  . Major depressive disorder, single episode, moderate ([place]) 10/24/2019  . Migraine without aura with status migrainosus 10/24/2019  . Nausea alone 04/19/2014  . Obstructive chronic bronchitis with exacerbation ([place]) 10/25/2019  . Other spondylosis with radiculopathy, lumbar region 10/24/2019  . Presence of left artificial hip joint 10/24/2019  . PTSD (post-traumatic stress disorder)   . 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 Senile osteoporosis 10/24/2019  . Thyroid disease     Past Surgical History:  Procedure Laterality Date  . APPENDECTOMY    . CARDIAC CATHETERIZATION    . CARPAL  TUNNEL RELEASE    . CESAREAN SECTION    . CHOLECYSTECTOMY    . CORONARY ANGIOPLASTY    . HAND SURGERY    . ICD GENERATOR CHANGEOUT N/A 05/21/2019   Procedure: ICD GENERATOR CHANGEOUT;   Surgeon: Constance Haw, MD;  Location: [facility];  Service: Cardiovascular;  Laterality: N/A;  . ICD IMPLANT     Medtronic  . intestinal blockage 2011    . LAPAROSCOPIC ASSISTED VENTRAL HERNIA REPAIR N/A 03/23/2015   Procedure: LAPAROSCOPIC VENTRAL WALL HERNIA REPAIR;  Surgeon: Michael Boston, MD;  Location: [facility];  Service: General;  Laterality: N/A;  With MESH  . LAPAROSCOPIC LYSIS OF ADHESIONS N/A 03/23/2015   Procedure: LAPAROSCOPIC LYSIS OF ADHESIONS;  Surgeon: Michael Boston, MD;  Location: [facility];  Service: General;  Laterality: N/A;  . NECK SURGERY     fused  . TONSILLECTOMY    . ULNAR NERVE TRANSPOSITION  01/23/2012   Procedure: ULNAR NERVE DECOMPRESSION/TRANSPOSITION;  Surgeon: Ophelia Charter, MD;  Location: [facility];  Service: Neurosurgery;  Laterality: Left;  LEFT ulnar nerve decompression    Family History  Problem Relation Age of Onset  . Heart attack Other   . Cancer Other   . Heart failure Other   . Hypertension Mother   . Heart attack Mother   . Alcohol abuse Father   . Hypertension Father   . Heart attack Father   . Anesthesia problems Neg Hx   . Hypotension Neg Hx   . Malignant hyperthermia Neg Hx   . Pseudochol deficiency Neg Hx     Social History   Socioeconomic History  . Marital status: Single    Spouse name: Not on file  . Number of children: 3  . Years of education: Not on file  . Highest education level: Not on file  Occupational History  . Occupation: disabled  Tobacco Use  . Smoking status: Former Smoker    Packs/day: 0.50    Years: 30.00    Pack years: 15.00    Types: Cigarettes    Quit date: 03/2020    Years since quitting: 0.3  . Smokeless tobacco: Never Used  Vaping Use  . Vaping Use: Never used  Substance and Sexual Activity  . Alcohol use: Yes    Alcohol/week: 2.0 standard drinks    Types: 1 Cans of beer, 1 Standard drinks or equivalent per week    Comment: seldom  . Drug use: No  . Sexual activity: Not  Currently  Other Topics Concern  . Not on file  Social History Narrative   One level home with boyfriend   Caffeine - coffee 1-2 cups/day; Green tea 4-5 bottles a day   Exercise - some    Right handed      Social Determinants of Health   Financial Resource Strain:   . Difficulty of Paying Living Expenses: Not on file  Food Insecurity:   . Worried About Charity fundraiser in the Last Year: Not on file  . Ran Out of Food in the Last Year: Not on file  Transportation Needs:   . Lack of Transportation (Medical): Not on file  . Lack of Transportation (Non-Medical): Not on file  Physical Activity:   . Days of Exercise per Week: Not on file  . Minutes of Exercise per Session: Not on file  Stress:   . Feeling of Stress : Not on file  Social Connections:   . Frequency of Communication with Friends and  Family: Not on file  . Frequency of Social Gatherings with Friends and Family: Not on file  . Attends Religious Services: Not on file  . Active Member of Clubs or Organizations: Not on file  . Attends Archivist Meetings: Not on file  . Marital Status: Not on file  Intimate Partner Violence:   . Fear of Current or Ex-Partner: Not on file  . Emotionally Abused: Not on file  . Physically Abused: Not on file  . Sexually Abused: Not on file    Outpatient Medications Prior to Visit  Medication Sig Dispense Refill  . aspirin 81 MG tablet Take 81 mg by mouth daily.      Marland Kitchen BREO ELLIPTA 100-25 MCG/INH AEPB INHALE ONE PUFF INTO THE LUNG ONCE DAILY 28 each 4  . Buprenorphine HCl (BELBUCA) 900 MCG FILM Place 900 mcg inside cheek every 12 (twelve) hours.    . carvedilol (COREG) 3.125 MG tablet Take 1 tablet (3.125 mg total) by mouth 2 (two) times daily. 180 tablet 2  . celecoxib (CELEBREX) 200 MG capsule TAKE ONE CAPSULE BY MOUTH EVERY MORNING 90 capsule 2  . ciprofloxacin (CIPRO) 250 MG tablet Take 1 tablet (250 mg total) by mouth 2 (two) times daily. 14 tablet 0  . clobetasol  ointment (TEMOVATE) 0.05 % Apply topically.    . cyclobenzaprine (FLEXERIL) 10 MG tablet Take 10 mg by mouth 3 (three) times daily as needed for muscle spasms.    Marland Kitchen denosumab (PROLIA) 60 MG/ML SOSY injection Inject 60 mg into the skin every 6 (six) months. 180 mL 2  . diazepam (VALIUM) 5 MG tablet Take 1 tablet (5 mg total) by mouth 3 (three) times daily as needed. 30 tablet 3  . diphenoxylate-atropine (LOMOTIL) 2.5-0.025 MG tablet Take 1 tablet by mouth 4 (four) times daily as needed for diarrhea or loose stools. 30 tablet 0  . ENTRESTO 24-26 MG TAKE ONE TABLET BY MOUTH EVERY MORNING and TAKE ONE TABLET BY MOUTH EVERY EVENING 180 tablet 2  . EPINEPHRINE 0.3 mg/0.3 mL IJ SOAJ injection Inject 0.3 mLs (0.3 mg total) into the muscle as needed for anaphylaxis. 1 each 2  . esomeprazole (NEXIUM) 20 MG capsule TAKE 1 CAPSULE BY MOUTH DAILY 90 capsule 2  . fludrocortisone (FLORINEF) 0.1 MG tablet Take 1 tablet (0.1 mg total) by mouth as directed. 30 tablet 3  . fluticasone (FLONASE) 50 MCG/ACT nasal spray Place 2 sprays into both nostrils daily as needed (congestion). 18 mL 2  . furosemide (LASIX) 40 MG tablet TAKE ONE TABLET BY MOUTH EVERY MORNING MAY take 0.5 tablet in THE afternoon IF needed 90 tablet 2  . gabapentin (NEURONTIN) 800 MG tablet Take 1 tablet (800 mg total) by mouth 3 (three) times daily. Patient usually takes twice a day 270 tablet 2  . ipratropium (ATROVENT) 0.02 % nebulizer solution Take 2.5 mLs (0.5 mg total) by nebulization every 4 (four) hours as needed for wheezing or shortness of breath. 150 mL 2  . Ketoprofen 25 MG CAPS Take by mouth.    . levocetirizine (XYZAL) 5 MG tablet Take 5 mg by mouth daily as needed.    . Multiple Vitamins-Minerals (AIRBORNE GUMMIES PO) Take 3 tablets by mouth daily in the afternoon.    . nitroGLYCERIN (NITROSTAT) 0.4 MG SL tablet Place 1 tablet (0.4 mg total) under the tongue every 5 (five) minutes as needed for chest pain. 25 tablet 6  . ondansetron  (ZOFRAN) 4 MG tablet Take 1 tablet (4 mg  total) by mouth every 8 (eight) hours as needed for nausea or vomiting. 20 tablet 0  . Oxycodone HCl 10 MG TABS Take 1 tablet by mouth every 4 (four) hours as needed.    . ranolazine (RANEXA) 500 MG 12 hr tablet Take 1 tablet (500 mg total) by mouth 2 (two) times daily. 180 tablet 2  . rosuvastatin (CRESTOR) 20 MG tablet TAKE 1 TABLET(20 MG) BY MOUTH DAILY 90 tablet 2  . sertraline (ZOLOFT) 50 MG tablet TAKE 1 TABLET BY MOUTH DAILY 90 tablet 2  . tiZANidine (ZANAFLEX) 4 MG tablet Take 4 mg by mouth at bedtime.    . topiramate (TOPAMAX) 50 MG tablet Take 2 tablets (100 mg total) by mouth at bedtime. 240 tablet 2  . Ubrogepant (UBRELVY) 100 MG TABS Take 1 tablet by mouth as needed (May repeat 1 tablet after 2 hours if needed.  Maximum 2 tablets in 24 hours). 16 tablet 6   No facility-administered medications prior to visit.    Allergies:   Patient has no known allergies.   Social History   Tobacco Use  . Smoking status: Former Smoker    Packs/day: 0.50    Years: 30.00    Pack years: 15.00    Types: Cigarettes    Quit date: 03/2020    Years since quitting: 0.3  . Smokeless tobacco: Never Used  Vaping Use  . Vaping Use: Never used  Substance Use Topics  . Alcohol use: Yes    Alcohol/week: 2.0 standard drinks    Types: 1 Cans of beer, 1 Standard drinks or equivalent per week    Comment: seldom  . Drug use: No     Review of Systems  Constitutional: Positive for fever. Negative for chills and diaphoresis.  HENT: Positive for congestion, sinus pain and sore throat. Negative for hearing loss.   Eyes: Negative for redness.  Respiratory: Positive for cough and sputum production.   Cardiovascular: Negative for chest pain, palpitations and claudication.  Gastrointestinal: Negative.   Genitourinary: Negative.   Musculoskeletal: Negative.   Skin: Negative for itching.  Neurological: Negative.      Labs/Other Tests and Data Reviewed:     Recent Labs: 12/02/2019: NT-Pro BNP 1,044 07/05/2020: ALT 6; BUN 16; Creatinine, Ser 1.37; Hemoglobin 10.9; Platelets 324; Potassium 4.3; Sodium 142; TSH 0.987   Recent Lipid Panel Lab Results  Component Value Date/Time   CHOL 141 07/05/2020 12:14 PM   TRIG 127 07/05/2020 12:14 PM   HDL 46 07/05/2020 12:14 PM   CHOLHDL 3.1 07/05/2020 12:14 PM   LDLCALC 72 07/05/2020 12:14 PM    Wt Readings from Last 3 Encounters:  07/27/20 129 lb (58.5 kg)  07/13/20 129 lb (58.5 kg)  07/05/20 130 lb 9.6 oz (59.2 kg)     Objective:    Vital Signs:  BP 115/76   Temp 98 F (36.7 C)   Ht 5' (1.524 m)   Wt 129 lb (58.5 kg)   BMI 25.19 kg/m    Physical Exam reviewed  ASSESSMENT & PLAN:   With low ejection fraction Diagnoses and all orders for this visit: Acute non-recurrent sinusitis, unspecified location -     amoxicillin-clavulanate (AUGMENTIN) 875-125 MG tablet; Take 1 tablet by mouth 2 (two) times daily.  She did have COVID-19 in January but but patient is a heart patient Patient has symptoms for sinusitis and we will treat her with the Augmentin which is worked well in the past.  I am still concerned about Covid exposure.  At increased risk of exposure to COVID-19 virus -     POC COVID-19 -     Novel Coronavirus, NAA ([facility]) Patient is at high risk for Covid infection she did have infection in January 2021 but has never gotten any of the immunizations.  She has multiple medical problems including cardiomyopathy with low ejection fraction.  She has been exposed to a child all week whose parents and the child have not been immunized.  We discussed the waning of antibodies from the natural immunity after 6 months.  I had to convince patient to come in early tomorrow morning to get the Covid test since she is at such high risk.       COVID-19 Education: The signs and symptoms of COVID-19 were discussed with the patient and how to seek care for testing (follow up with PCP or arrange  E-visit). The importance of social distancing was discussed today.   I spent 20 minutes dedicated to the care of this patient on the date of this encounter to include face-to-face time with the patient, as well as: I had extensive discussion with the patient and reviewed her records regarding her cardiac problems.  I explained to her the risk of having recurrent Covid since she has not been immunized and she is exposed to nonimmunized people all week.  She agrees and will come in for a Covid test.  Follow Up:  In Person prn  Signed,  Reinaldo Meeker, MD  07/27/2020 4:02 PM    [place]

## 2020-07-27 NOTE — Chronic Care Management (AMB) (Signed)
Chronic Care Management Pharmacy Assistant   Name: [name]  MRN: [id number] DOB: [birth year]  Reason for Encounter: Medication Review  Patient Questions:  1.  Have you seen any other providers since your last visit?Yes, 07/05/2020- Dr. Henrene Pastor (PCP), 07/13/2020- Dr. Henrene Pastor (PCP), 07/18/2020- Luz Lex, [facility] ([facility]M).             2.  Any changes in your medicines or health?Yes, 07/13/2020- Ciprofloxacin 250 mg- 2 times daily and Fludrocortisone 0.1 mg added. Nitrofurantoin 100 mg discontinued.     PCP : Lillard Anes, MD  Allergies:  No Known Allergies  Medications: Outpatient Encounter Medications as of 07/27/2020  Medication Sig  . aspirin 81 MG tablet Take 81 mg by mouth daily.    Marland Kitchen BREO ELLIPTA 100-25 MCG/INH AEPB INHALE ONE PUFF INTO THE LUNG ONCE DAILY  . Buprenorphine HCl (BELBUCA) 900 MCG FILM Place 900 mcg inside cheek every 12 (twelve) hours.  . carvedilol (COREG) 3.125 MG tablet Take 1 tablet (3.125 mg total) by mouth 2 (two) times daily.  . celecoxib (CELEBREX) 200 MG capsule TAKE ONE CAPSULE BY MOUTH EVERY MORNING  . ciprofloxacin (CIPRO) 250 MG tablet Take 1 tablet (250 mg total) by mouth 2 (two) times daily.  . clobetasol ointment (TEMOVATE) 0.05 % Apply topically.  . cyclobenzaprine (FLEXERIL) 10 MG tablet Take 10 mg by mouth 3 (three) times daily as needed for muscle spasms.  Marland Kitchen denosumab (PROLIA) 60 MG/ML SOSY injection Inject 60 mg into the skin every 6 (six) months.  . diazepam (VALIUM) 5 MG tablet TAKE ONE TABLET BY MOUTH THREE times A DAY AS NEEDED  . diphenoxylate-atropine (LOMOTIL) 2.5-0.025 MG tablet Take 1 tablet by mouth 4 (four) times daily as needed for diarrhea or loose stools.  Marland Kitchen ENTRESTO 24-26 MG TAKE ONE TABLET BY MOUTH EVERY MORNING and TAKE ONE TABLET BY MOUTH EVERY EVENING  . EPINEPHRINE 0.3 mg/0.3 mL IJ SOAJ injection Inject 0.3 mLs (0.3 mg total) into the muscle as needed for anaphylaxis.  Marland Kitchen esomeprazole (NEXIUM) 20 MG capsule TAKE 1  CAPSULE BY MOUTH DAILY  . fludrocortisone (FLORINEF) 0.1 MG tablet Take 1 tablet (0.1 mg total) by mouth as directed.  . fluticasone (FLONASE) 50 MCG/ACT nasal spray Place 2 sprays into both nostrils daily as needed (congestion).  . furosemide (LASIX) 40 MG tablet TAKE ONE TABLET BY MOUTH EVERY MORNING MAY take 0.5 tablet in THE afternoon IF needed  . gabapentin (NEURONTIN) 800 MG tablet Take 1 tablet (800 mg total) by mouth 3 (three) times daily. Patient usually takes twice a day  . ipratropium (ATROVENT) 0.02 % nebulizer solution Take 2.5 mLs (0.5 mg total) by nebulization every 4 (four) hours as needed for wheezing or shortness of breath.  . Ketoprofen 25 MG CAPS Take by mouth.  . levocetirizine (XYZAL) 5 MG tablet Take 5 mg by mouth daily as needed.  . Multiple Vitamins-Minerals (AIRBORNE GUMMIES PO) Take 3 tablets by mouth daily in the afternoon.  . nitroGLYCERIN (NITROSTAT) 0.4 MG SL tablet Place 1 tablet (0.4 mg total) under the tongue every 5 (five) minutes as needed for chest pain.  Marland Kitchen ondansetron (ZOFRAN) 4 MG tablet Take 1 tablet (4 mg total) by mouth every 8 (eight) hours as needed for nausea or vomiting.  . Oxycodone HCl 10 MG TABS Take 1 tablet by mouth every 4 (four) hours as needed.  . ranolazine (RANEXA) 500 MG 12 hr tablet Take 1 tablet (500 mg total) by mouth 2 (two) times  daily.  . rosuvastatin (CRESTOR) 20 MG tablet TAKE 1 TABLET(20 MG) BY MOUTH DAILY  . sertraline (ZOLOFT) 50 MG tablet TAKE 1 TABLET BY MOUTH DAILY  . tiZANidine (ZANAFLEX) 4 MG tablet Take 4 mg by mouth at bedtime.  . topiramate (TOPAMAX) 50 MG tablet Take 2 tablets (100 mg total) by mouth at bedtime.  Marland Kitchen Ubrogepant (UBRELVY) 100 MG TABS Take 1 tablet by mouth as needed (May repeat 1 tablet after 2 hours if needed.  Maximum 2 tablets in 24 hours).   No facility-administered encounter medications on file as of 07/27/2020.    Current Diagnosis: Patient Active Problem List   Diagnosis Date Noted  .  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 Dehydration 05/17/2020  . Hypotension 05/17/2020  . Persistent vomiting 05/17/2020  . Diarrhea 05/17/2020  . Cystitis 05/17/2020  . BMI 26.0-26.9,adult 03/29/2020  . Obstructive chronic bronchitis with exacerbation ([place]) 10/25/2019  . Admission for long-term opiate analgesic use 10/24/2019  . Presence of left artificial hip joint 10/24/2019  . Senile osteoporosis 10/24/2019  . Chronic hypoxemic respiratory failure ([place]) 10/24/2019  . Other spondylosis with radiculopathy, lumbar region 10/24/2019  . Major depressive disorder, single episode, moderate ([place]) 10/24/2019  . Migraine without aura with status migrainosus 10/24/2019  . 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 Chronic pain of both knees 12/21/2018  . Chronic systolic congestive heart failure, NYHA class 2 ([place]) 06/19/2017  . Dilated cardiomyopathy ([place]) 06/19/2017  . 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 Arthritis of right hip 05/29/2016  . Coronary artery disease involving native coronary artery of native heart without angina pectoris 05/31/2015  . Dyslipidemia 05/31/2015  . Essential hypertension 05/31/2015  . Ileus following gastrointestinal surgery ([place]) 03/26/2015  . Chronic narcotic use 03/23/2015  . GERD (gastroesophageal reflux disease)   . 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 COPD (chronic obstructive pulmonary disease) ([place])   . Cardiomyopathy ([place])   . Chronic back pain     Reviewed chart for medication changes ahead of medication coordination call.  OVs - 07/05/2020- Dr. Henrene Pastor (PCP), 07/13/2020- Dr. Henrene Pastor (PCP), 07/18/2020- Luz Lex, [facility] ([facility]M) since last care coordination call.  Medication changes indicated: 07/13/2020- Ciprofloxacin 250 mg- 2 times daily and Fludrocortisone 0.1 mg added. Nitrofurantoin 100 mg discontinued.     BP Readings from Last 3 Encounters:  07/13/20 (!)  82/52  07/05/20 (!) 78/50  05/19/20 90/60    No results found for: HGBA1C   Patient obtains medications through Vials  30 Days   Last adherence delivery included:  Diazepam 5 mg - 1 tablet three times daily PRN  Entresto 24-26 mg -1 tablet twice daily  Rosuvastatin 20 mg -1 tablet daily Breo-Elipta100-25 MCG- Inhale one puff into lung once daily Ranolazine Er 500 MG- take 1 tablet by mouth 2 times daily   Patient declined the following medications last month  Carvedilol 3.125 Mg- 1 tablet twice daily due to having an adequate supply from [facility] #[facility]90 on hand. Celecoxib 200 mg -1 tablet daily due to PRN use. Nexium 20 mg -1 capsule daily due to having an adequate supply on hand 3 bottles from [facility]. Furosemide 40 mg - 1 tablet daily in the morning and  tablet in the  afternoon as needed due to having an adequate supply on hand, starting 06/18/20 fill from [facility]. Used all from [facility] first. Gabapentin 800 mg -1 tablet twice daily due to having and adequate supply on hand, 2-3 bottles from [facility]  Topiramate 100 mg -Patient is now taking 3 tablets at night, using [facility] supply, completed 05/26/20 fill from [facility]. Sertraline 500 mg -1 tablet daily due to receiving a #90 from [facility]. Nitroglycerin 0.4 mg -1 tablet under tongue every 5 minutes as neededdue to PRN use. Fluticasone 50 MG- Spray 2 sprays in both nostrils daily due to PRN use. Ipratropium0.02% - take 2.5Mls by nebulization every 4 hours as needed due to PRN use.  Alendronate 70 MG- take 1 tablet by mouth once weekly due to being discontinued, therapy changed to Prolia injections.  Xyzal 5 mg- take 5 MG by mouth daily as needed due PRN use.  Epinephrinedue to PRN use. Linzess 145 mcg- 1 tablet daily due to having an adequate supply on hand and uses PRN.  Patient is due for next adherence delivery on: 07/28/20 . Called patient and reviewed medications and coordinated delivery.  This  delivery to include: Topiramate 50 mg 2 tablets at bedtime Xyzal 5 mg 1 tablet daily Ubrelvy 100 mg 1 tablet daily as needed may repeat with 1 tablet after 2 hours if needed. Linzess 145 mcg 1 cap every morning Ranolazine ER 500 mg 1 tablet twice daily Esomeprazole 20 mg 1 capsule before breakfast daily Carvedilol 3.125 mg 1 tablet by mouth twice daily Diazepam 5 mg 1 tablet 3 times a day as needed    Patient does not need a short fill or acute fill of medications, prior to adherence delivery.   Coordinated acute fill for the following medications: Breo Ellipta 100-25 mcg inhale 1 puff into the lungs once daily as directed Entresto 24-26 mg 1 tablet twice daily Rosuvastating 20 mg 1 tablet every morning Prolia Sol 60 mg/ ml Inject 60 mg into the skin every 6 months.   Patient declined the following medications Celebrex 200 mg ordered 07/26/20 for 90 ds Furosemide 40 mg ordered 07/26/20 for 90 ds Breo Ellipta 100-25 mcg still has 2 weeks left Entresto 24-26 mg still has 2 weeks left Rosuvastatin 20 mg - still has about 2 weeks left Gabapentin 800 mg- Had to stop taking for short while due to side effects. Epi Pen- PRN use Sertraline 50 mg- Has abundance on hand because she states she forgets to take. Prolia - Has 2 weeks left Nitroglycerin sub 0.4 mg- PRN use Fluticasone - PRN use Ipratropium Bromide- States "another company fills this for her" Alendronate 70 mg -PCP discontinued   Patient needs refills for diazepam 5 mg 1 tablet 3 times daily as needed.  Confirmed delivery date of advised patient that pharmacy will contact them the morning of delivery. Follow-Up:  Care Coordination with Outside Provider

## 2020-07-28 DIAGNOSIS — Z9189 Other specified personal risk factors, not elsewhere classified: Secondary | ICD-10-CM | POA: Diagnosis not present

## 2020-07-28 DIAGNOSIS — Z7689 Persons encountering health services in other specified circumstances: Secondary | ICD-10-CM | POA: Diagnosis not present

## 2020-07-29 LAB — NOVEL CORONAVIRUS, NAA: SARS-CoV-2, NAA: NOT DETECTED

## 2020-07-29 LAB — SARS-COV-2, NAA 2 DAY TAT

## 2020-07-30 NOTE — Progress Notes (Signed)
Negative Covid lp

## 2020-07-31 DIAGNOSIS — M419 Scoliosis, unspecified: Secondary | ICD-10-CM | POA: Diagnosis not present

## 2020-07-31 DIAGNOSIS — M545 Low back pain, unspecified: Secondary | ICD-10-CM | POA: Diagnosis not present

## 2020-07-31 DIAGNOSIS — G8929 Other chronic pain: Secondary | ICD-10-CM | POA: Diagnosis not present

## 2020-07-31 DIAGNOSIS — M5136 Other intervertebral disc degeneration, lumbar region: Secondary | ICD-10-CM | POA: Diagnosis not present

## 2020-07-31 DIAGNOSIS — Z79899 Other long term (current) drug therapy: Secondary | ICD-10-CM | POA: Diagnosis not present

## 2020-08-08 DIAGNOSIS — J9611 Chronic respiratory failure with hypoxia: Secondary | ICD-10-CM | POA: Diagnosis not present

## 2020-08-08 DIAGNOSIS — I2510 Atherosclerotic heart disease of native coronary artery without angina pectoris: Secondary | ICD-10-CM | POA: Diagnosis not present

## 2020-08-08 DIAGNOSIS — I5022 Chronic systolic (congestive) heart failure: Secondary | ICD-10-CM | POA: Diagnosis not present

## 2020-08-09 NOTE — Progress Notes (Signed)
Covid test negative lp

## 2020-08-11 ENCOUNTER — Other Ambulatory Visit: Payer: Self-pay

## 2020-08-11 ENCOUNTER — Ambulatory Visit (INDEPENDENT_AMBULATORY_CARE_PROVIDER_SITE_OTHER): Payer: Medicare Other | Admitting: Legal Medicine

## 2020-08-11 ENCOUNTER — Encounter: Payer: Self-pay | Admitting: Legal Medicine

## 2020-08-11 VITALS — BP 80/50 | HR 98 | Temp 97.8°F | Resp 16 | Ht 60.0 in | Wt 128.0 lb

## 2020-08-11 DIAGNOSIS — J441 Chronic obstructive pulmonary disease with (acute) exacerbation: Secondary | ICD-10-CM | POA: Diagnosis not present

## 2020-08-11 DIAGNOSIS — J449 Chronic obstructive pulmonary disease, unspecified: Secondary | ICD-10-CM | POA: Diagnosis not present

## 2020-08-11 DIAGNOSIS — M810 Age-related osteoporosis without current pathological fracture: Secondary | ICD-10-CM | POA: Diagnosis not present

## 2020-08-11 DIAGNOSIS — F321 Major depressive disorder, single episode, moderate: Secondary | ICD-10-CM

## 2020-08-11 MED ORDER — SERTRALINE HCL 100 MG PO TABS: 50.0000 mg | ORAL_TABLET | Freq: Every day | ORAL | 2 refills | Status: DC

## 2020-08-11 MED ORDER — LEVOFLOXACIN 500 MG PO TABS: 500.0000 mg | ORAL_TABLET | Freq: Every day | ORAL | 0 refills | Status: DC
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
## 2020-08-13 ENCOUNTER — Encounter: Payer: Self-pay | Admitting: Legal Medicine

## 2020-08-15 ENCOUNTER — Telehealth: Payer: Self-pay

## 2020-08-15 DIAGNOSIS — J441 Chronic obstructive pulmonary disease with (acute) exacerbation: Secondary | ICD-10-CM | POA: Diagnosis not present

## 2020-08-15 NOTE — Chronic Care Management (AMB) (Signed)
Name: [name]           MRN: [id number]        DOB: March 25, 1956  Reason for Encounter: Medication Review  Patient Questions:             1. Have you seen any other providers since your last visit?Yes,07/05/2020- Dr. Henrene Pastor (PCP), 07/13/2020- Dr. Henrene Pastor (PCP), 07/18/2020- Luz Lex, [facility] (CCM). 2. Any changes in your medicines or health?Yes,07/13/2020- Ciprofloxacin 250 mg- 2 times daily and Fludrocortisone 0.1 mg added.Nitrofurantoin100 mg discontinued.              PCP : Lillard Anes, MD  Allergies:  No Known Allergies  Medications:     Outpatient Encounter Medications as of 07/27/2020  Medication Sig  . aspirin 81 MG tablet Take 81 mg by mouth daily.    Marland Kitchen BREO ELLIPTA 100-25 MCG/INH AEPB INHALE ONE PUFF INTO THE LUNG ONCE DAILY  . Buprenorphine HCl (BELBUCA) 900 MCG FILM Place 900 mcg inside cheek every 12 (twelve) hours.  . carvedilol (COREG) 3.125 MG tablet Take 1 tablet (3.125 mg total) by mouth 2 (two) times daily.  . celecoxib (CELEBREX) 200 MG capsule TAKE ONE CAPSULE BY MOUTH EVERY MORNING  . ciprofloxacin (CIPRO) 250 MG tablet Take 1 tablet (250 mg total) by mouth 2 (two) times daily.  . clobetasol ointment (TEMOVATE) 0.05 % Apply topically.  . cyclobenzaprine (FLEXERIL) 10 MG tablet Take 10 mg by mouth 3 (three) times daily as needed for muscle spasms.  Marland Kitchen denosumab (PROLIA) 60 MG/ML SOSY injection Inject 60 mg into the skin every 6 (six) months.  . diazepam (VALIUM) 5 MG tablet TAKE ONE TABLET BY MOUTH THREE times A DAY AS NEEDED  . diphenoxylate-atropine (LOMOTIL) 2.5-0.025 MG tablet Take 1 tablet by mouth 4 (four) times daily as needed for diarrhea or loose stools.  Marland Kitchen ENTRESTO 24-26 MG TAKE ONE TABLET BY MOUTH EVERY MORNING and TAKE ONE TABLET BY MOUTH EVERY EVENING  . EPINEPHRINE 0.3 mg/0.3 mL IJ SOAJ injection Inject 0.3 mLs (0.3 mg total) into the muscle as needed for anaphylaxis.  Marland Kitchen esomeprazole (NEXIUM) 20 MG capsule TAKE 1 CAPSULE  BY MOUTH DAILY  . fludrocortisone (FLORINEF) 0.1 MG tablet Take 1 tablet (0.1 mg total) by mouth as directed.  . fluticasone (FLONASE) 50 MCG/ACT nasal spray Place 2 sprays into both nostrils daily as needed (congestion).  . furosemide (LASIX) 40 MG tablet TAKE ONE TABLET BY MOUTH EVERY MORNING MAY take 0.5 tablet in THE afternoon IF needed  . gabapentin (NEURONTIN) 800 MG tablet Take 1 tablet (800 mg total) by mouth 3 (three) times daily. Patient usually takes twice a day  . ipratropium (ATROVENT) 0.02 % nebulizer solution Take 2.5 mLs (0.5 mg total) by nebulization every 4 (four) hours as needed for wheezing or shortness of breath.  . Ketoprofen 25 MG CAPS Take by mouth.  . levocetirizine (XYZAL) 5 MG tablet Take 5 mg by mouth daily as needed.  . Multiple Vitamins-Minerals (AIRBORNE GUMMIES PO) Take 3 tablets by mouth daily in the afternoon.  . nitroGLYCERIN (NITROSTAT) 0.4 MG SL tablet Place 1 tablet (0.4 mg total) under the tongue every 5 (five) minutes as needed for chest pain.  Marland Kitchen ondansetron (ZOFRAN) 4 MG tablet Take 1 tablet (4 mg total) by mouth every 8 (eight) hours as needed for nausea or vomiting.  . Oxycodone HCl 10 MG TABS Take 1 tablet by mouth every 4 (four) hours as needed.  . ranolazine (RANEXA) 500 MG 12  hr tablet Take 1 tablet (500 mg total) by mouth 2 (two) times daily.  . rosuvastatin (CRESTOR) 20 MG tablet TAKE 1 TABLET(20 MG) BY MOUTH DAILY  . sertraline (ZOLOFT) 50 MG tablet TAKE 1 TABLET BY MOUTH DAILY  . tiZANidine (ZANAFLEX) 4 MG tablet Take 4 mg by mouth at bedtime.  . topiramate (TOPAMAX) 50 MG tablet Take 2 tablets (100 mg total) by mouth at bedtime.  Marland Kitchen Ubrogepant (UBRELVY) 100 MG TABS Take 1 tablet by mouth as needed (May repeat 1 tablet after 2 hours if needed.  Maximum 2 tablets in 24 hours).   No facility-administered encounter medications on file as of 07/27/2020.    Current Diagnosis:     Patient Active Problem List   Diagnosis Date Noted  .  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[place]) 07/13/2020  . Dehydration 05/17/2020  . Hypotension 05/17/2020  . Persistent vomiting 05/17/2020  . Diarrhea 05/17/2020  . Cystitis 05/17/2020  . BMI 26.0-26.9,adult 03/29/2020  . Obstructive chronic bronchitis with exacerbation ([place]) 10/25/2019  . Admission for long-term opiate analgesic use 10/24/2019  . Presence of left artificial hip joint 10/24/2019  . Senile osteoporosis 10/24/2019  . Chronic hypoxemic respiratory failure ([place]) 10/24/2019  . Other spondylosis with radiculopathy, lumbar region 10/24/2019  . Major depressive disorder, single episode, moderate ([place]) 10/24/2019  . Migraine without aura with status migrainosus 10/24/2019  . 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 Chronic pain of both knees 12/21/2018  . Chronic systolic congestive heart failure, NYHA class 2 ([place]) 06/19/2017  . Dilated cardiomyopathy ([place]) 06/19/2017  . 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 Arthritis of right hip 05/29/2016  . Coronary artery disease involving native coronary artery of native heart without angina pectoris 05/31/2015  . Dyslipidemia 05/31/2015  . Essential hypertension 05/31/2015  . Ileus following gastrointestinal surgery ([place]) 03/26/2015  . Chronic narcotic use 03/23/2015  . GERD (gastroesophageal reflux disease)   . 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 COPD (chronic obstructive pulmonary disease) ([place])   . Cardiomyopathy ([place])   . Chronic back pain     Reviewed chart for medication changes ahead of medication coordination call.  OVs- 07/05/2020- Dr. Henrene Pastor (PCP), 07/13/2020- Dr. Henrene Pastor (PCP), 07/18/2020- Luz Lex, [facility] (CCM)since last care coordination call.  Medication changes indicated: 07/13/2020- Ciprofloxacin 250 mg- 2 times daily and Fludrocortisone 0.1 mg added.Nitrofurantoin100 mg discontinued.        BP Readings from Last 3 Encounters:   07/13/20 (!) 82/52  07/05/20 (!) 78/50  05/19/20 90/60    Recent Labs  No results found for: HGBA1C     Patient obtains medications through Vials  30 Days   Last adherence delivery included:  Diazepam5 mg-1 tablet three times dailyPRN  Entresto 24-26 mg-1tablet twice daily Rosuvastatin20 mg-1tablet daily Breo-Elipta100-25 MCG- Inhale one puff into lung once daily Ranolazine Er 500 MG- take 1 tablet by mouth 2 times daily  Patient declined the following medications last month  Carvedilol 3.125 Mg- 1 tablet twice dailydue to having an adequate supply from [facility] #80-90 on hand. Celecoxib 200 mg -1tablet dailydue to PRN use. Nexium 20 mg -1capsule dailydue to having an adequate supply on hand 3 bottles from [facility]. Furosemide 40 mg - 1tablet dailyin the morningand  tabletin theafternoon as needed due to having an adequate supply on hand, starting 06/18/20 fill from [facility]. Used all from [facility] first. Gabapentin 800 mg -1tablet twice dailydue to having and adequate supply on hand, 2-3 bottles from [facility] Topiramate100 mg-Patient is  now taking 3 tablets at night, using [facility] supply, completed 05/26/20 fill from [facility]. Sertraline 500 mg -1tablet dailydue to receiving a #90 from [place]. Nitroglycerin 0.4 mg -1 tablet under tongue every 5 minutes as neededdue to PRN use. Fluticasone 50 MG- Spray 2 sprays in both nostrils dailydue to PRN use. Ipratropium0.02% - take 2.5Mls by nebulization every 4 hours as neededdue to PRN use. Alendronate 70 MG- take 1 tablet by mouth once weekly due to being discontinued, therapy changed to Prolia injections. Xyzal 5 mg- take 5 MG by mouth daily as needed due PRN use. Epinephrinedue to PRN use. Linzess 145 mcg- 1 tablet daily due to having an adequate supply on hand and uses PRN.  Patient is due for next adherence delivery on: 07/28/20 . Called patient and reviewed medications and  coordinated delivery.  This delivery to include: Topiramate 50 mg 2 tablets at bedtime Xyzal 5 mg 1 tablet daily Ubrelvy 100 mg 1 tablet daily as needed may repeat with 1 tablet after 2 hours if needed. Linzess 145 mcg 1 cap every morning Ranolazine ER 500 mg 1 tablet twice daily Esomeprazole 20 mg 1 capsule before breakfast daily Carvedilol 3.125 mg 1 tablet by mouth twice daily Diazepam 5 mg 1 tablet 3 times a day as needed    Patient does not need a short fill or acute fill of medications, prior to adherence delivery.   Coordinated acute fill for the following medications: Breo Ellipta 100-25 mcg inhale 1 puff into the lungs once daily as directed Entresto 24-26 mg 1 tablet twice daily Rosuvastating 20 mg 1 tablet every morning Prolia Sol 60 mg/ ml Inject 60 mg into the skin every 6 months.   Patient declined the following medications Celebrex 200 mg ordered 07/26/20 for 90 ds Furosemide 40 mg ordered 07/26/20 for 90 ds Breo Ellipta 100-25 mcg still has 2 weeks left Entresto 24-26 mg still has 2 weeks left Rosuvastatin 20 mg - still has about 2 weeks left Gabapentin 800 mg- Had to stop taking for short while due to side effects. Epi Pen- PRN use Sertraline 50 mg- Has abundance on hand because she states she forgets to take. Prolia - Has 2 weeks left Nitroglycerin sub 0.4 mg- PRN use Fluticasone - PRN use Ipratropium Bromide- States "another company fills this for her" Alendronate 70 mg -PCP discontinued   Patient needs refills for diazepam 5 mg 1 tablet 3 times daily as needed.  Confirmed delivery date of advised patient that pharmacy will contact them the morning of delivery. Follow-Up:  Pharmacist review.   Elly Modena Owens Shark, [place] notified.   Margaretmary Dys, [place] [facility] Assistant [phone]

## 2020-08-15 NOTE — Chronic Care Management (AMB) (Signed)
Chronic Care Management Pharmacy Assistant   Name: [name]  MRN: [id number] DOB: 1956-02-02  Reason for Encounter: Medication Review  Patient Questions:  1.  Have you seen any other providers since your last visit? Yes 08/11/20- Dr. Reinaldo Meeker-   2.  Any changes in your medicines or health? Yes 08/11/20 Dr. Henrene Pastor added short course of levofloxacin  PCP : Lillard Anes, MD  Allergies:  No Known Allergies  Medications: Outpatient Encounter Medications as of 08/15/2020  Medication Sig  . aspirin 81 MG tablet Take 81 mg by mouth daily.  Marland Kitchen BREO ELLIPTA 100-25 MCG/INH AEPB INHALE ONE PUFF INTO THE LUNG ONCE DAILY  . Buprenorphine HCl (BELBUCA) 900 MCG FILM Place 900 mcg inside cheek every 12 (twelve) hours.  . carvedilol (COREG) 3.125 MG tablet Take 1 tablet (3.125 mg total) by mouth 2 (two) times daily.  . celecoxib (CELEBREX) 200 MG capsule TAKE ONE CAPSULE BY MOUTH EVERY MORNING  . clobetasol ointment (TEMOVATE) 0.05 % Apply topically.  . cyclobenzaprine (FLEXERIL) 10 MG tablet Take 10 mg by mouth 3 (three) times daily as needed for muscle spasms.  Marland Kitchen denosumab (PROLIA) 60 MG/ML SOSY injection Inject 60 mg into the skin every 6 (six) months.  . diazepam (VALIUM) 5 MG tablet Take 1 tablet (5 mg total) by mouth 3 (three) times daily as needed.  . diphenoxylate-atropine (LOMOTIL) 2.5-0.025 MG tablet Take 1 tablet by mouth 4 (four) times daily as needed for diarrhea or loose stools.  Marland Kitchen ENTRESTO 24-26 MG TAKE ONE TABLET BY MOUTH EVERY MORNING and TAKE ONE TABLET BY MOUTH EVERY EVENING  . EPINEPHRINE 0.3 mg/0.3 mL IJ SOAJ injection Inject 0.3 mLs (0.3 mg total) into the muscle as needed for anaphylaxis.  Marland Kitchen esomeprazole (NEXIUM) 20 MG capsule TAKE 1 CAPSULE BY MOUTH DAILY  . fludrocortisone (FLORINEF) 0.1 MG tablet Take 1 tablet (0.1 mg total) by mouth as directed.  . fluticasone (FLONASE) 50 MCG/ACT nasal spray Place 2 sprays into both nostrils daily as needed  (congestion).  . furosemide (LASIX) 40 MG tablet TAKE ONE TABLET BY MOUTH EVERY MORNING MAY take 0.5 tablet in THE afternoon IF needed  . gabapentin (NEURONTIN) 800 MG tablet Take 1 tablet (800 mg total) by mouth 3 (three) times daily. Patient usually takes twice a day  . ipratropium (ATROVENT) 0.02 % nebulizer solution Take 2.5 mLs (0.5 mg total) by nebulization every 4 (four) hours as needed for wheezing or shortness of breath.  . Ketoprofen 25 MG CAPS Take by mouth.  . levocetirizine (XYZAL) 5 MG tablet Take 5 mg by mouth daily as needed.  Marland Kitchen levofloxacin (LEVAQUIN) 500 MG tablet Take 1 tablet (500 mg total) by mouth daily.  . Multiple Vitamins-Minerals (AIRBORNE GUMMIES PO) Take 3 tablets by mouth daily in the afternoon.  . nitroGLYCERIN (NITROSTAT) 0.4 MG SL tablet Place 1 tablet (0.4 mg total) under the tongue every 5 (five) minutes as needed for chest pain.  Marland Kitchen ondansetron (ZOFRAN) 4 MG tablet Take 1 tablet (4 mg total) by mouth every 8 (eight) hours as needed for nausea or vomiting.  . Oxycodone HCl 10 MG TABS Take 1 tablet by mouth every 4 (four) hours as needed.  . ranolazine (RANEXA) 500 MG 12 hr tablet Take 1 tablet (500 mg total) by mouth 2 (two) times daily.  . rosuvastatin (CRESTOR) 20 MG tablet TAKE 1 TABLET(20 MG) BY MOUTH DAILY  . sertraline (ZOLOFT) 100 MG tablet Take 0.5 tablets (50 mg total) by mouth  daily.  . tiZANidine (ZANAFLEX) 4 MG tablet Take 4 mg by mouth at bedtime.  . topiramate (TOPAMAX) 50 MG tablet Take 2 tablets (100 mg total) by mouth at bedtime.  Marland Kitchen Ubrogepant (UBRELVY) 100 MG TABS Take 1 tablet by mouth as needed (May repeat 1 tablet after 2 hours if needed.  Maximum 2 tablets in 24 hours).   No facility-administered encounter medications on file as of 08/15/2020.    Current Diagnosis: Patient Active Problem List   Diagnosis Date Noted  . 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 Dehydration 05/17/2020  . Hypotension 05/17/2020  . Persistent vomiting  05/17/2020  . Diarrhea 05/17/2020  . Cystitis 05/17/2020  . BMI 26.0-26.9,adult 03/29/2020  . Obstructive chronic bronchitis with exacerbation ([place]) 10/25/2019  . Admission for long-term opiate analgesic use 10/24/2019  . Presence of left artificial hip joint 10/24/2019  . Senile osteoporosis 10/24/2019  . Chronic hypoxemic respiratory failure ([place]) 10/24/2019  . Other spondylosis with radiculopathy, lumbar region 10/24/2019  . Major depressive disorder, single episode, moderate ([place]) 10/24/2019  . Migraine without aura with status migrainosus 10/24/2019  . 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 Chronic pain of both knees 12/21/2018  . Chronic systolic congestive heart failure, NYHA class 2 ([place]) 06/19/2017  . Dilated cardiomyopathy ([place]) 06/19/2017  . 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 Arthritis of right hip 05/29/2016  . Coronary artery disease involving native coronary artery of native heart without angina pectoris 05/31/2015  . Dyslipidemia 05/31/2015  . Essential hypertension 05/31/2015  . Ileus following gastrointestinal surgery ([place]) 03/26/2015  . Chronic narcotic use 03/23/2015  . GERD (gastroesophageal reflux disease)   . 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 COPD (chronic obstructive pulmonary disease) ([place])   . Cardiomyopathy ([place])   . Chronic back pain    Unable to get in contact with patient.  Reviewed chart for medication changes ahead of medication coordination call.  OVs12/17/21- Dr. Reinaldo Meeker- No consults, or hospital visits since last care coordination call/Pharmacist visit.   No medication changes indicated.  BP Readings from Last 3 Encounters:  08/11/20 (!) 80/50  07/27/20 115/76  07/13/20 (!) 82/52    No results found for: HGBA1C   Patient obtains medications through Vials  30 Days   Last adherence delivery included: Topiramate 50 mg 2 tablets at  bedtime Xyzal 5 mg 1 tablet daily Ubrelvy 100 mg 1 tablet daily as needed may repeat with 1 tablet after 2 hours if needed. Linzess 145 mcg 1 cap every morning Ranolazine ER 500 mg 1 tablet twice daily Esomeprazole 20 mg 1 capsule before breakfast daily Carvedilol 3.125 mg 1 tablet by mouth twice daily Diazepam 5 mg 1 tablet 3 times a day as needed  Patient declined the following medications last month: Celebrex 200 mg ordered 07/26/20 for 90 ds Furosemide 40 mg ordered 07/26/20 for 90 ds Breo Ellipta 100-25 mcg still has 2 weeks left Entresto 24-26 mg still has 2 weeks left Rosuvastatin 20 mg - still has about 2 weeks left Gabapentin 800 mg- Had to stop taking for short while due to side effects. Epi Pen- PRN use Sertraline 50 mg- Has abundance on hand because she states she forgets to take. Prolia - Has 2 weeks left Nitroglycerin sub 0.4 mg- PRN use Fluticasone - PRN use Ipratropium Bromide- States "another company fills this for her" Alendronate 70 mg -PCP discontinued  Patient is due for next adherence delivery on: 08/24/20.  Attempted to contact  patient and review medications and coordinate delivery.  This delivery to include: Xyzal 5 mg 1 tablet daily Diazepam 5 mg 1 tablet 3 times a day as needed Topiramate 50 mg 2 tablets at bedtime Linzess 145 mcg 1 cap every morning Esomeprazole 20 mg 1 capsule before breakfast daily  Patient will need a short/acute fill of the following medications after adherence delivery. To align with sync date  Sertraline 50 mg take 1/2 tablet daily Breo Ellipta 100-25 mcg inhale 1 puff into the lungs once daily Entresto 24-26 mg 1 tablet twice daily Rosuvastatin 20 mg 1 tablet in the morning    Patient declined the following medications Celebrex 200 mg ordered 07/26/20 for 90 ds Furosemide 40 mg ordered 07/26/20 for 90 ds Breo Ellipta 100-25 mcg still has 2 weeks left Entresto 24-26 mg still has 2 weeks left Rosuvastatin 20 mg - still has  about 2 weeks left Gabapentin 800 mg- Had to stop taking for short while due to side effects. Epi Pen- PRN use Sertraline 50 mg- has 2 weeks left Prolia - Has 6 months left Nitroglycerin sub 0.4 mg- PRN use Fluticasone - PRN use Ipratropium Bromide- States "another company fills this for her" Alendronate 70 mg -PCP discontinued   Patient does not need refills for any medications.  Confirmed delivery date of 08/24/20, advised patient that pharmacy will contact them the morning of delivery.    Follow-Up:  Comptroller and Pharmacist Review   Elly Modena. Owens Shark, [place] notified  Margaretmary Dys, [place] Pharmacy Assistant [phone]

## 2020-08-22 DIAGNOSIS — Z9622 Myringotomy tube(s) status: Secondary | ICD-10-CM | POA: Diagnosis not present

## 2020-08-23 NOTE — Telephone Encounter (Deleted)
Closing encounter as provider does not have security to close it. I have made no changes to this encounter.

## 2020-08-31 ENCOUNTER — Telehealth: Payer: Self-pay

## 2020-08-31 ENCOUNTER — Ambulatory Visit (INDEPENDENT_AMBULATORY_CARE_PROVIDER_SITE_OTHER): Payer: 59

## 2020-08-31 DIAGNOSIS — I420 Dilated cardiomyopathy: Secondary | ICD-10-CM | POA: Diagnosis not present

## 2020-08-31 LAB — CUP PACEART REMOTE DEVICE CHECK
Battery Remaining Longevity: 117 mo
Battery Voltage: 2.97 V
Brady Statistic AP VP Percent: 0.04 %
Brady Statistic AP VS Percent: 36.1 %
Brady Statistic AS VP Percent: 0.03 %
Brady Statistic AS VS Percent: 63.83 %
Brady Statistic RA Percent Paced: 35.84 %
Brady Statistic RV Percent Paced: 0.07 %
Date Time Interrogation Session: 20220106043824
HighPow Impedance: 60 Ohm
Implantable Lead Implant Date: 20120502
Implantable Lead Implant Date: 20120502
Implantable Lead Location: 753859
Implantable Lead Location: 753860
Implantable Lead Model: 4076
Implantable Lead Model: 7122
Implantable Pulse Generator Implant Date: 20200925
Lead Channel Impedance Value: 361 Ohm
Lead Channel Impedance Value: 532 Ohm
Lead Channel Impedance Value: 665 Ohm
Lead Channel Pacing Threshold Amplitude: 0.5 V
Lead Channel Pacing Threshold Amplitude: 1.125 V
Lead Channel Pacing Threshold Pulse Width: 0.4 ms
Lead Channel Pacing Threshold Pulse Width: 0.4 ms
Lead Channel Sensing Intrinsic Amplitude: 2.5 mV
Lead Channel Sensing Intrinsic Amplitude: 2.5 mV
Lead Channel Sensing Intrinsic Amplitude: 8.625 mV
Lead Channel Sensing Intrinsic Amplitude: 8.625 mV
Lead Channel Setting Pacing Amplitude: 1.5 V
Lead Channel Setting Pacing Amplitude: 2.25 V
Lead Channel Setting Pacing Pulse Width: 0.4 ms
Lead Channel Setting Sensing Sensitivity: 0.3 mV

## 2020-08-31 NOTE — Progress Notes (Deleted)
NEUROLOGY FOLLOW UP OFFICE NOTE  RYLEAH MIRAMONTES [id number]   Subjective:  Blayklee Mable is a 65 year old Caucasian woman with CHF with EF 30-35%, s/p AICD, COPD, chronic back pain, chronic knee pain, arthritis, HTN,hyperlipidemiaand PTSD and history of MI and former smokerand history of childhood seizureswhofollows up for migraines and myoclonus.  UPDATE: Last seen in February.  At that time, increased topiramate to 100mg  at bedtime and had her try Ubrelvy for rescue medication.  I also recommended tapering off of gabapentin to assess for resolution of myoclonus.   Intensity:severe Duration:All day. 1 hour if takes [place] immediately.  Frequency:every other day. Current NSAIDS:ASA 81mg ; Celebrex Current analgesics:none Current triptans:none Current ergotamine:none Current anti-emetic:none Current muscle relaxants:none Current anti-anxiolytic:Valium Current sleep aide:none Current Antihypertensive medications:Coreg, Lasix Current Antidepressant medications:none Current Anticonvulsant medications:Topiramate 100mg  at bedtime; gabapentin800mg  twice daily Current anti-CGRP:Ubrelvy 100mg  Current Vitamins/Herbal/Supplements:none Current Antihistamines/Decongestants:Flonase Other therapy:none Hormone/birth control:none  Caffeine:3 cups of coffee daily Alcohol:no Smoker:former Diet:100 oz water daily. No soda.  Exercise:no  HISTORY: She has had headaches since around 2017 but gradually got worse, particularly since November 2019.They are severe bifrontal pounding/squeezing headache. There is noaura.They are associated withphotophobia, phonophobia, blurred vision and dizziness.They typically last 1 hourand occur 5 to 6 a day every day. They are triggered by loud noise.Sumatriptan helps relieve the intensity but does not abort it..  Since November 2019, shealso has had episodes of dizziness outside of the  dizziness as well,causing her to fall. She reports sudden spinning sensation lasting a few seconds and resolves. She says it is not positional. She has associated nausea and blurred vision. She says it occurs 2 to 3 times a day.   She had a CT of head without contrast on 11/02/18 which was personally reviewed and was unremarkable, demonstrating mild atrophy and mild chronic small vessel ischemic changes but no acute intracranial abnormality.  She is treated by pain management for chronic pain. She has degenerative disc disease of the cervical spine with previous neck fusion. She has history of total right hip replacement. She has chronic low back pain with radicular symptoms. She has bilateral knee pain.  Reports jerking movements of extremities.  Past NSAIDS:none Past analgesics:Tylenol (contraindicated due to CHF) Past abortive triptans:sumatriptan 25mg  Past abortive ergotamine:none Past muscle relaxants:none Past anti-emetic:none Past antihypertensive medications:lisinopril Past antidepressant medications:none Past anticonvulsant medications:none Past anti-CGRP:none Past vitamins/Herbal/Supplements:none Past antihistamines/decongestants:none Other past therapies:none  PAST MEDICAL HISTORY: Past Medical History:  Diagnosis Date  . Admission for long-term opiate analgesic use 10/24/2019  . Arthritis of right hip 05/29/2016   Formatting of this note might be different from the original. Added automatically from request for surgery [id number]  . Cardiomyopathy [facility])    Overview:  Ejection fraction 45% in 2015 Ejection fraction 30 to 35% in November 2018  . Chronic back pain   . Chronic hypoxemic respiratory failure ([place]) 10/24/2019  . Chronic narcotic use 03/23/2015  . Chronic pain of both knees 12/21/2018   Added automatically from request for surgery [id number]  Formatting of this note might be different from the original. Added automatically from request for  surgery [id number]  . Chronic systolic congestive heart failure, NYHA class 2 ([place]) 06/19/2017  . COPD (chronic obstructive pulmonary disease) ([place])   . Coronary artery disease involving native coronary artery of native heart without angina pectoris 05/31/2015   Overview:  Abnormal stress test in fall of 2016, cardiac catheterization showed normal coronaries.  . Dilated cardiomyopathy ([place]) 06/19/2017  . 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 Dyslipidemia 05/31/2015  . Essential hypertension 05/31/2015  . GERD (gastroesophageal reflux disease)   . ICD (implantable cardiac defibrillator) in place 2012  . Ileus following gastrointestinal surgery ([place]) 03/26/2015  . Major depressive disorder, single episode, moderate ([place]) 10/24/2019  . Migraine without aura with status migrainosus 10/24/2019  . Nausea alone 04/19/2014  . Obstructive chronic bronchitis with exacerbation ([place]) 10/25/2019  . Other spondylosis with radiculopathy, lumbar region 10/24/2019  . Presence of left artificial hip joint 10/24/2019  . PTSD (post-traumatic stress disorder)   . 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 Senile osteoporosis 10/24/2019  . Thyroid disease     MEDICATIONS: Current Outpatient Medications on File Prior to Visit  Medication Sig Dispense Refill  . aspirin 81 MG tablet Take 81 mg by mouth daily.    Marland Kitchen BREO ELLIPTA 100-25 MCG/INH AEPB INHALE ONE PUFF INTO THE LUNG ONCE DAILY 28 each 4  . Buprenorphine HCl (BELBUCA) 900 MCG FILM Place 900 mcg inside cheek every 12 (twelve) hours.    . carvedilol (COREG) 3.125 MG tablet Take 1 tablet (3.125 mg total) by mouth 2 (two) times daily. 180 tablet 2  . celecoxib (CELEBREX) 200 MG capsule TAKE ONE CAPSULE BY MOUTH EVERY MORNING 90 capsule 2  . clobetasol ointment (TEMOVATE) 0.05 % Apply topically.    . cyclobenzaprine (FLEXERIL) 10 MG tablet Take 10 mg  by mouth 3 (three) times daily as needed for muscle spasms.    Marland Kitchen denosumab (PROLIA) 60 MG/ML SOSY injection Inject 60 mg into the skin every 6 (six) months. 180 mL 2  . diazepam (VALIUM) 5 MG tablet Take 1 tablet (5 mg total) by mouth 3 (three) times daily as needed. 30 tablet 3  . diphenoxylate-atropine (LOMOTIL) 2.5-0.025 MG tablet Take 1 tablet by mouth 4 (four) times daily as needed for diarrhea or loose stools. 30 tablet 0  . ENTRESTO 24-26 MG TAKE ONE TABLET BY MOUTH EVERY MORNING and TAKE ONE TABLET BY MOUTH EVERY EVENING 180 tablet 2  . EPINEPHRINE 0.3 mg/0.3 mL IJ SOAJ injection Inject 0.3 mLs (0.3 mg total) into the muscle as needed for anaphylaxis. 1 each 2  . esomeprazole (NEXIUM) 20 MG capsule TAKE 1 CAPSULE BY MOUTH DAILY 90 capsule 2  . fludrocortisone (FLORINEF) 0.1 MG tablet Take 1 tablet (0.1 mg total) by mouth as directed. 30 tablet 3  . fluticasone (FLONASE) 50 MCG/ACT nasal spray Place 2 sprays into both nostrils daily as needed (congestion). 18 mL 2  . furosemide (LASIX) 40 MG tablet TAKE ONE TABLET BY MOUTH EVERY MORNING MAY take 0.5 tablet in THE afternoon IF needed 90 tablet 2  . gabapentin (NEURONTIN) 800 MG tablet Take 1 tablet (800 mg total) by mouth 3 (three) times daily. Patient usually takes twice a day 270 tablet 2  . ipratropium (ATROVENT) 0.02 % nebulizer solution Take 2.5 mLs (0.5 mg total) by nebulization every 4 (four) hours as needed for wheezing or shortness of breath. 150 mL 2  . Ketoprofen 25 MG CAPS Take by mouth.    . levocetirizine (XYZAL) 5 MG tablet Take 5 mg by mouth daily as needed.    Marland Kitchen levofloxacin (LEVAQUIN) 500 MG tablet Take 1 tablet (500 mg total) by mouth daily. 7 tablet 0  . Multiple Vitamins-Minerals (AIRBORNE GUMMIES PO) Take 3 tablets by mouth daily in the afternoon.    . nitroGLYCERIN (NITROSTAT) 0.4 MG SL tablet Place 1  tablet (0.4 mg total) under the tongue every 5 (five) minutes as needed for chest pain. 25 tablet 6  . ondansetron  (ZOFRAN) 4 MG tablet Take 1 tablet (4 mg total) by mouth every 8 (eight) hours as needed for nausea or vomiting. 20 tablet 0  . Oxycodone HCl 10 MG TABS Take 1 tablet by mouth every 4 (four) hours as needed.    . ranolazine (RANEXA) 500 MG 12 hr tablet Take 1 tablet (500 mg total) by mouth 2 (two) times daily. 180 tablet 2  . rosuvastatin (CRESTOR) 20 MG tablet TAKE 1 TABLET(20 MG) BY MOUTH DAILY 90 tablet 2  . sertraline (ZOLOFT) 100 MG tablet Take 0.5 tablets (50 mg total) by mouth daily. 90 tablet 2  . tiZANidine (ZANAFLEX) 4 MG tablet Take 4 mg by mouth at bedtime.    . topiramate (TOPAMAX) 50 MG tablet Take 2 tablets (100 mg total) by mouth at bedtime. 240 tablet 2  . Ubrogepant (UBRELVY) 100 MG TABS Take 1 tablet by mouth as needed (May repeat 1 tablet after 2 hours if needed.  Maximum 2 tablets in 24 hours). 16 tablet 6   No current facility-administered medications on file prior to visit.    ALLERGIES: No Known Allergies  FAMILY HISTORY: Family History  Problem Relation Age of Onset  . Heart attack Other   . Cancer Other   . Heart failure Other   . Hypertension Mother   . Heart attack Mother   . Alcohol abuse Father   . Hypertension Father   . Heart attack Father   . Anesthesia problems Neg Hx   . Hypotension Neg Hx   . Malignant hyperthermia Neg Hx   . Pseudochol deficiency Neg Hx     SOCIAL HISTORY: Social History   Socioeconomic History  . Marital status: Single    Spouse name: Not on file  . Number of children: 3  . Years of education: Not on file  . Highest education level: Not on file  Occupational History  . Occupation: disabled  Tobacco Use  . Smoking status: Former Smoker    Packs/day: 0.50    Years: 30.00    Pack years: 15.00    Types: Cigarettes    Quit date: 03/2020    Years since quitting: 0.4  . Smokeless tobacco: Never Used  Vaping Use  . Vaping Use: Never used  Substance and Sexual Activity  . Alcohol use: Yes    Alcohol/week: 2.0  standard drinks    Types: 1 Cans of beer, 1 Standard drinks or equivalent per week    Comment: seldom  . Drug use: No  . Sexual activity: Not Currently  Other Topics Concern  . Not on file  Social History Narrative   One level home with boyfriend   Caffeine - coffee 1-2 cups/day; Green tea 4-5 bottles a day   Exercise - some    Right handed      Social Determinants of Health   Financial Resource Strain: Not on file  Food Insecurity: Not on file  Transportation Needs: Not on file  Physical Activity: Not on file  Stress: Not on file  Social Connections: Not on file  Intimate Partner Violence: Not on file     Objective:  *** General: No acute distress.  Patient appears well-groomed.   Head:  Normocephalic/atraumatic Eyes:  Fundi examined but not visualized Neck: supple, no paraspinal tenderness, full range of motion Heart:  Regular rate and rhythm Lungs:  Clear to  auscultation bilaterally Back: No paraspinal tenderness Neurological Exam: alert and oriented to person, place, and time. Attention span and concentration intact, recent and remote memory intact, fund of knowledge intact.  Speech fluent and not dysarthric, language intact.  CN II-XII intact. Bulk and tone normal, muscle strength 5/5 throughout.  Sensation to light touch, temperature and vibration intact.  Deep tendon reflexes 2+ throughout, toes downgoing.  Finger to nose and heel to shin testing intact.  Gait normal, Romberg negative.   Assessment/Plan:   1.  Chronic migraine without aura, without status migrainosus, not intractable 2.  ***  1.  Migraine prevention:  topiramate 100mg  at bedtime 2.  Migraine rescue:  Roselyn Meier 100mg  3.  ***  Metta Clines, DO  CC: Reinaldo Meeker, MD

## 2020-08-31 NOTE — Telephone Encounter (Signed)
Carelink alert received for increased Optivol since mid November-December.   Called patient to discuss medication compliance / sign and symptomsMed list includes the following, Entresto 24-26 mg BID, Lasix 40 mg (1 tablet in the am and 0.5 tablet in the evenin prn).   No answer to either number on file. LMOVM.

## 2020-09-01 ENCOUNTER — Ambulatory Visit: Payer: Medicare Other | Admitting: Neurology

## 2020-09-01 ENCOUNTER — Telehealth: Payer: Self-pay | Admitting: *Deleted

## 2020-09-01 NOTE — Telephone Encounter (Signed)
Left message to call back  Will route to device clinic for their FYI as they were trying to reach pt yesterday

## 2020-09-01 NOTE — Telephone Encounter (Signed)
-----   Message from Will Meredith Leeds, MD sent at 08/31/2020  1:35 PM EST ----- Abnormal device interrogation reviewed.  Lead parameters and battery status stable.  Optovol elevated. Take lasix 40 mg BID for 3 days., enroll in [facility] clinic.

## 2020-09-04 ENCOUNTER — Other Ambulatory Visit: Payer: Self-pay

## 2020-09-04 DIAGNOSIS — E079 Disorder of thyroid, unspecified: Secondary | ICD-10-CM | POA: Insufficient documentation

## 2020-09-04 DIAGNOSIS — F4310 Post-traumatic stress disorder, unspecified: Secondary | ICD-10-CM | POA: Insufficient documentation

## 2020-09-05 ENCOUNTER — Encounter: Payer: Self-pay | Admitting: Cardiology

## 2020-09-05 ENCOUNTER — Ambulatory Visit (INDEPENDENT_AMBULATORY_CARE_PROVIDER_SITE_OTHER): Payer: 59 | Admitting: Cardiology

## 2020-09-05 ENCOUNTER — Other Ambulatory Visit: Payer: Self-pay

## 2020-09-05 VITALS — BP 96/58 | HR 54 | Ht 60.0 in | Wt 126.0 lb

## 2020-09-05 DIAGNOSIS — I420 Dilated cardiomyopathy: Secondary | ICD-10-CM

## 2020-09-05 DIAGNOSIS — I739 Peripheral vascular disease, unspecified: Secondary | ICD-10-CM

## 2020-09-05 DIAGNOSIS — I10 Essential (primary) hypertension: Secondary | ICD-10-CM | POA: Diagnosis not present

## 2020-09-05 DIAGNOSIS — Z95810 Presence of automatic (implantable) cardiac defibrillator: Secondary | ICD-10-CM

## 2020-09-05 NOTE — Addendum Note (Signed)
Addended by: Truddie Hidden on: 09/05/2020 11:30 AM   Modules accepted: Orders

## 2020-09-05 NOTE — Addendum Note (Signed)
Addended by: Jerl Santos R on: 09/05/2020 11:24 AM   Modules accepted: Orders

## 2020-09-05 NOTE — Patient Instructions (Addendum)
Medication Instructions:  Your physician recommends that you continue on your current medications as directed. Please refer to the Current Medication list given to you today.  *If you need a refill on your cardiac medications before your next appointment, please call your pharmacy*   Lab Work: Your physician recommends that you return for lab work today: BMP and Pro BNP  If you have labs (blood work) drawn today and your tests are completely normal, you will receive your results only by: Marland Kitchen MyChart Message (if you have MyChart) OR . A paper copy in the mail If you have any lab test that is abnormal or we need to change your treatment, we will call you to review the results.   Testing/Procedures: Your physician has requested that you have an echocardiogram. Echocardiography is a painless test that uses sound waves to create images of your heart. It provides your doctor with information about the size and shape of your heart and how well your heart's chambers and valves are working. This procedure takes approximately one hour. There are no restrictions for this procedure.  Your physician has requested that you have a lower or upper extremity arterial duplex. This test is an ultrasound of the arteries in the legs or arms. It looks at arterial blood flow in the legs and arms. Allow one hour for Lower and Upper Arterial scans. There are no restrictions or special instructions Echocardiogram An echocardiogram is a test that uses sound waves (ultrasound) to produce images of the heart. Images from an echocardiogram can provide important information about:  Heart size and shape.  The size and thickness and movement of your heart's walls.  Heart muscle function and strength.  Heart valve function or if you have stenosis. Stenosis is when the heart valves are too narrow.  If blood is flowing backward through the heart valves (regurgitation).  A tumor or infectious growth around the heart  valves.  Areas of heart muscle that are not working well because of poor blood flow or injury from a heart attack.  Aneurysm detection. An aneurysm is a weak or damaged part of an artery wall. The wall bulges out from the normal force of blood pumping through the body. Tell a health care provider about:  Any allergies you have.  All medicines you are taking, including vitamins, herbs, eye drops, creams, and over-the-counter medicines.  Any blood disorders you have.  Any surgeries you have had.  Any medical conditions you have.  Whether you are pregnant or may be pregnant. What are the risks? Generally, this is a safe test. However, problems may occur, including an allergic reaction to dye (contrast) that may be used during the test. What happens before the test? No specific preparation is needed. You may eat and drink normally. What happens during the test?  You will take off your clothes from the waist up and put on a hospital gown.  Electrodes or electrocardiogram (ECG)patches may be placed on your chest. The electrodes or patches are then connected to a device that monitors your heart rate and rhythm.  You will lie down on a table for an ultrasound exam. A gel will be applied to your chest to help sound waves pass through your skin.  A handheld device, called a transducer, will be pressed against your chest and moved over your heart. The transducer produces sound waves that travel to your heart and bounce back (or "echo" back) to the transducer. These sound waves will be captured in real-time  and changed into images of your heart that can be viewed on a video monitor. The images will be recorded on a computer and reviewed by your health care provider.  You may be asked to change positions or hold your breath for a short time. This makes it easier to get different views or better views of your heart.  In some cases, you may receive contrast through an IV in one of your veins. This  can improve the quality of the pictures from your heart. The procedure may vary among health care providers and hospitals.   What can I expect after the test? You may return to your normal, everyday life, including diet, activities, and medicines, unless your health care provider tells you not to do that. Follow these instructions at home:  It is up to you to get the results of your test. Ask your health care provider, or the department that is doing the test, when your results will be ready.  Keep all follow-up visits. This is important. Summary  An echocardiogram is a test that uses sound waves (ultrasound) to produce images of the heart.  Images from an echocardiogram can provide important information about the size and shape of your heart, heart muscle function, heart valve function, and other possible heart problems.  You do not need to do anything to prepare before this test. You may eat and drink normally.  After the echocardiogram is completed, you may return to your normal, everyday life, unless your health care provider tells you not to do that. This information is not intended to replace advice given to you by your health care provider. Make sure you discuss any questions you have with your health care provider. Document Revised: 04/04/2020 Document Reviewed: 04/04/2020 Elsevier Patient Education  2021 [place]: At [facility], you and your health needs are our priority.  As part of our continuing mission to provide you with exceptional heart care, we have created designated Provider Care Teams.  These Care Teams include your primary Cardiologist (physician) and Advanced Practice Providers (APPs -  Physician Assistants and Nurse Practitioners) who all work together to provide you with the care you need, when you need it.  We recommend signing up for the patient portal called "MyChart".  Sign up information is provided on this After Visit Summary.  MyChart  is used to connect with patients for Virtual Visits (Telemedicine).  Patients are able to view lab/test results, encounter notes, upcoming appointments, etc.  Non-urgent messages can be sent to your provider as well.   To learn more about what you can do with MyChart, go to [email or web address].    Your next appointment:   4 month(s)  The format for your next appointment:   In Person  Provider:   Jenne Campus, MD   Other Instructions

## 2020-09-05 NOTE — Progress Notes (Signed)
Cardiology Office Note:    Date:  09/05/2020   ID:  [name], DOB 08-30-1955, MRN [id number]  PCP:  Lillard Anes, MD  Cardiologist:  Jenne Campus, MD    Referring MD: Lillard Anes,*   No chief complaint on file. Doing well  History of Present Illness:    [name] is a 65 y.o. female with past medical history significant for nonischemic cardiomyopathy with ejection fraction below 35%, last estimation from December 15, 2019 showing ejection fraction 35 to 40%.  BiV ICD present, essential hypertension, fibromyalgia.  She comes today to my office for follow-up.  Overall she says she is doing well.  Around Christmas time pacemaker/defibrillator has been interrogated which showed evidence of increased OptiVol she was asked to take some extra diuretic for about a week and she is doing much better.  She told me that she did have some shortness of breath at that time minimal swelling of lower extremities but no other issues.  She complained however about having weakness and fatigue and tiredness in her legs when she walks.  There is no typical pain of claudications but she is very concerned about it and that is what keep her away from doing more exercises.  Past Medical History:  Diagnosis Date  . Admission for long-term opiate analgesic use 10/24/2019  . Arthritis of right hip 05/29/2016   Formatting of this note might be different from the original. Added automatically from request for surgery [phone]  . BMI 26.0-26.9,adult 03/29/2020  . Cardiomyopathy [facility])    Overview:  Ejection fraction 45% in 2015 Ejection fraction 30 to 35% in November 2018  . Chronic back pain   . Chronic hypoxemic respiratory failure ([place]) 10/24/2019  . Chronic narcotic use 03/23/2015  . Chronic pain of both knees 12/21/2018   Added automatically from request for surgery [id number]  Formatting of this note might be different from the original. Added automatically from request for surgery [phone]  .  Chronic systolic congestive heart failure, NYHA class 2 ([place]) 06/19/2017  . COPD (chronic obstructive pulmonary disease) ([place])   . Coronary artery disease involving native coronary artery of native heart without angina pectoris 05/31/2015   Overview:  Abnormal stress test in fall of 2016, cardiac catheterization showed normal coronaries.  . Cystitis 05/17/2020  . Dehydration 05/17/2020  . Diarrhea 05/17/2020  . Dilated cardiomyopathy ([place]) 06/19/2017  . 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 Dyslipidemia 05/31/2015  . Essential hypertension 05/31/2015  . GERD (gastroesophageal reflux disease)   . 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[place]) 07/13/2020  . Hypotension 05/17/2020  . ICD (implantable cardiac defibrillator) in place 2012  . 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 Ileus following gastrointestinal surgery ([place]) 03/26/2015  . Major depressive disorder, single episode, moderate ([place]) 10/24/2019  . Migraine without aura with status migrainosus 10/24/2019  . Nausea alone 04/19/2014  . Obstructive chronic bronchitis with exacerbation ([place]) 10/25/2019  . Other spondylosis with radiculopathy, lumbar region 10/24/2019  . Persistent vomiting 05/17/2020  . Presence of left artificial hip joint 10/24/2019  . PTSD (post-traumatic stress disorder)   . 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 Senile osteoporosis 10/24/2019  . Thyroid disease     Past Surgical History:  Procedure Laterality Date  . APPENDECTOMY    . CARDIAC CATHETERIZATION    . CARPAL TUNNEL RELEASE    . CESAREAN SECTION    . CHOLECYSTECTOMY    .  CORONARY ANGIOPLASTY    . HAND SURGERY    . ICD GENERATOR CHANGEOUT N/A 05/21/2019   Procedure: ICD GENERATOR CHANGEOUT;  Surgeon: Constance Haw, MD;  Location: [facility];  Service: Cardiovascular;  Laterality: N/A;  . ICD IMPLANT     Medtronic   . intestinal blockage 2011    . LAPAROSCOPIC ASSISTED VENTRAL HERNIA REPAIR N/A 03/23/2015   Procedure: LAPAROSCOPIC VENTRAL WALL HERNIA REPAIR;  Surgeon: Michael Boston, MD;  Location: [facility];  Service: General;  Laterality: N/A;  With MESH  . LAPAROSCOPIC LYSIS OF ADHESIONS N/A 03/23/2015   Procedure: LAPAROSCOPIC LYSIS OF ADHESIONS;  Surgeon: Michael Boston, MD;  Location: [facility];  Service: General;  Laterality: N/A;  . NECK SURGERY     fused  . TONSILLECTOMY    . ULNAR NERVE TRANSPOSITION  01/23/2012   Procedure: ULNAR NERVE DECOMPRESSION/TRANSPOSITION;  Surgeon: Ophelia Charter, MD;  Location: [facility];  Service: Neurosurgery;  Laterality: Left;  LEFT ulnar nerve decompression    Current Medications: Current Meds  Medication Sig  . aspirin 81 MG tablet Take 81 mg by mouth daily.  Marland Kitchen BREO ELLIPTA 100-25 MCG/INH AEPB INHALE ONE PUFF INTO THE LUNG ONCE DAILY  . Buprenorphine HCl (BELBUCA) 900 MCG FILM Place 900 mcg inside cheek every 12 (twelve) hours.  . carvedilol (COREG) 3.125 MG tablet Take 1 tablet (3.125 mg total) by mouth 2 (two) times daily.  . celecoxib (CELEBREX) 200 MG capsule TAKE ONE CAPSULE BY MOUTH EVERY MORNING  . clobetasol ointment (TEMOVATE) 0.05 % Apply topically.  . cyclobenzaprine (FLEXERIL) 10 MG tablet Take 10 mg by mouth 3 (three) times daily as needed for muscle spasms.  Marland Kitchen denosumab (PROLIA) 60 MG/ML SOSY injection Inject 60 mg into the skin every 6 (six) months.  . diazepam (VALIUM) 5 MG tablet Take 1 tablet (5 mg total) by mouth 3 (three) times daily as needed.  . diphenoxylate-atropine (LOMOTIL) 2.5-0.025 MG tablet Take 1 tablet by mouth 4 (four) times daily as needed for diarrhea or loose stools.  Marland Kitchen ENTRESTO 24-26 MG TAKE ONE TABLET BY MOUTH EVERY MORNING and TAKE ONE TABLET BY MOUTH EVERY EVENING  . EPINEPHRINE 0.3 mg/0.3 mL IJ SOAJ injection Inject 0.3 mLs (0.3 mg total) into the muscle as needed for anaphylaxis.  Marland Kitchen esomeprazole (NEXIUM) 20 MG capsule TAKE  1 CAPSULE BY MOUTH DAILY  . fludrocortisone (FLORINEF) 0.1 MG tablet Take 1 tablet (0.1 mg total) by mouth as directed.  . fluticasone (FLONASE) 50 MCG/ACT nasal spray Place 2 sprays into both nostrils daily as needed (congestion).  . furosemide (LASIX) 40 MG tablet TAKE ONE TABLET BY MOUTH EVERY MORNING MAY take 0.5 tablet in THE afternoon IF needed  . gabapentin (NEURONTIN) 800 MG tablet Take 1 tablet (800 mg total) by mouth 3 (three) times daily. Patient usually takes twice a day  . ipratropium (ATROVENT) 0.02 % nebulizer solution Take 2.5 mLs (0.5 mg total) by nebulization every 4 (four) hours as needed for wheezing or shortness of breath.  . Ketoprofen 25 MG CAPS Take by mouth.  . levocetirizine (XYZAL) 5 MG tablet Take 5 mg by mouth daily as needed.  Marland Kitchen levofloxacin (LEVAQUIN) 500 MG tablet Take 1 tablet (500 mg total) by mouth daily.  Marland Kitchen LINZESS 145 MCG CAPS capsule Take 145 mcg by mouth daily.  . Multiple Vitamins-Minerals (AIRBORNE GUMMIES PO) Take 3 tablets by mouth daily in the afternoon.  . nitroGLYCERIN (NITROSTAT) 0.4 MG SL tablet Place 1 tablet (0.4 mg  total) under the tongue every 5 (five) minutes as needed for chest pain.  Marland Kitchen ondansetron (ZOFRAN) 4 MG tablet Take 1 tablet (4 mg total) by mouth every 8 (eight) hours as needed for nausea or vomiting.  . Oxycodone HCl 10 MG TABS Take 1 tablet by mouth every 4 (four) hours as needed.  . ranolazine (RANEXA) 500 MG 12 hr tablet Take 1 tablet (500 mg total) by mouth 2 (two) times daily.  . rosuvastatin (CRESTOR) 20 MG tablet TAKE 1 TABLET(20 MG) BY MOUTH DAILY  . sertraline (ZOLOFT) 100 MG tablet Take 0.5 tablets (50 mg total) by mouth daily.  Marland Kitchen tiZANidine (ZANAFLEX) 4 MG tablet Take 4 mg by mouth at bedtime.  . topiramate (TOPAMAX) 50 MG tablet Take 2 tablets (100 mg total) by mouth at bedtime.  Marland Kitchen Ubrogepant (UBRELVY) 100 MG TABS Take 1 tablet by mouth as needed (May repeat 1 tablet after 2 hours if needed.  Maximum 2 tablets in 24 hours).      Allergies:   Patient has no known allergies.   Social History   Socioeconomic History  . Marital status: Single    Spouse name: Not on file  . Number of children: 3  . Years of education: Not on file  . Highest education level: Not on file  Occupational History  . Occupation: disabled  Tobacco Use  . Smoking status: Former Smoker    Packs/day: 0.50    Years: 30.00    Pack years: 15.00    Types: Cigarettes    Quit date: 03/2020    Years since quitting: 0.4  . Smokeless tobacco: Never Used  Vaping Use  . Vaping Use: Never used  Substance and Sexual Activity  . Alcohol use: Yes    Alcohol/week: 2.0 standard drinks    Types: 1 Cans of beer, 1 Standard drinks or equivalent per week    Comment: seldom  . Drug use: No  . Sexual activity: Not Currently  Other Topics Concern  . Not on file  Social History Narrative   One level home with boyfriend   Caffeine - coffee 1-2 cups/day; Green tea 4-5 bottles a day   Exercise - some    Right handed      Social Determinants of Health   Financial Resource Strain: Not on file  Food Insecurity: Not on file  Transportation Needs: Not on file  Physical Activity: Not on file  Stress: Not on file  Social Connections: Not on file     Family History: The patient's family history includes Alcohol abuse in her father; Cancer in an other family member; Heart attack in her father, mother, and another family member; Heart failure in an other family member; Hypertension in her father and mother. There is no history of Anesthesia problems, Hypotension, Malignant hyperthermia, or Pseudochol deficiency. ROS:   Please see the history of present illness.    All 14 point review of systems negative except as described per history of present illness  EKGs/Labs/Other Studies Reviewed:      Recent Labs: 12/02/2019: NT-Pro BNP 1,044 07/05/2020: ALT 6; BUN 16; Creatinine, Ser 1.37; Hemoglobin 10.9; Platelets 324; Potassium 4.3; Sodium 142; TSH  0.987  Recent Lipid Panel    Component Value Date/Time   CHOL 141 07/05/2020 1214   TRIG 127 07/05/2020 1214   HDL 46 07/05/2020 1214   CHOLHDL 3.1 07/05/2020 1214   LDLCALC 72 07/05/2020 1214    Physical Exam:    VS:  BP (!) 96/58 (BP  Location: Left Arm, Patient Position: Sitting)   Pulse (!) 54   Ht 5' (1.524 m)   Wt 126 lb (57.2 kg)   SpO2 95%   BMI 24.61 kg/m     Wt Readings from Last 3 Encounters:  09/05/20 126 lb (57.2 kg)  08/11/20 128 lb (58.1 kg)  07/27/20 129 lb (58.5 kg)     GEN:  Well nourished, well developed in no acute distress HEENT: Normal NECK: No JVD; No carotid bruits LYMPHATICS: No lymphadenopathy CARDIAC: RRR, no murmurs, no rubs, no gallops RESPIRATORY:  Clear to auscultation without rales, wheezing or rhonchi  ABDOMEN: Soft, non-tender, non-distended MUSCULOSKELETAL:  No edema; No deformity  SKIN: Warm and dry LOWER EXTREMITIES: no swelling NEUROLOGIC:  Alert and oriented x 3 PSYCHIATRIC:  Normal affect   ASSESSMENT:    1. Dilated cardiomyopathy ([place])   2. Essential hypertension   3. ICD (implantable cardioverter-defibrillator) in place    PLAN:    In order of problems listed above:  1. Dilated cardiomyopathy.  Last echocardiogram showed ejection fraction 35-40.  Problem is her blood pressure being low this would prevent me from putting her on higher dosages of medications but overall she seems to be doing well.  She required some extra diuresis couple weeks ago because of increased OptiVol and some shortness of breath but doing much better right now.  I will check a proBNP as well as Chem-7 today. 2. Essential hypertension: With dealing with opposite problem right now blood pressure being low. 3. ICD present, I did review interrogation I did review evidence of increased OptiVol.  She does have a Medtronic device.   Medication Adjustments/Labs and Tests Ordered: Current medicines are reviewed at length with the patient today.  Concerns  regarding medicines are outlined above.  No orders of the defined types were placed in this encounter.  Medication changes: No orders of the defined types were placed in this encounter.   Signed, Park Liter, MD, [facility] 09/05/2020 11:18 AM    [facility]

## 2020-09-06 LAB — BASIC METABOLIC PANEL
BUN/Creatinine Ratio: 15 (ref 12–28)
BUN: 17 mg/dL (ref 8–27)
CO2: 21 mmol/L (ref 20–29)
Calcium: 8.9 mg/dL (ref 8.7–10.3)
Chloride: 109 mmol/L — ABNORMAL HIGH (ref 96–106)
Creatinine, Ser: 1.15 mg/dL — ABNORMAL HIGH (ref 0.57–1.00)
GFR calc Af Amer: 57 mL/min/{1.73_m2} — ABNORMAL LOW (ref >59)
GFR calc non Af Amer: 50 mL/min/{1.73_m2} — ABNORMAL LOW (ref >59)
Glucose: 87 mg/dL (ref 65–99)
Potassium: 4.5 mmol/L (ref 3.5–5.2)
Sodium: 142 mmol/L (ref 134–144)

## 2020-09-06 LAB — PRO B NATRIURETIC PEPTIDE: NT-Pro BNP: 546 pg/mL — ABNORMAL HIGH (ref 0–301)

## 2020-09-06 NOTE — Telephone Encounter (Signed)
Patient returning call made due to increased Optivol per latest remote transmission on 08/31/20.Patient reports she had increased edema in BLE throughout the end of November into December. She reports that Dr Agustin Cree increased her Lasix in December to Lasix 20 mg 2 tablets in AM and 20 mg pm. She reports her edema decreased and she felt better. She was seen by Dr Agustin Cree 09/05/20 and she is now taking Lasix 40 mg BID per Dr Agustin Cree.

## 2020-09-08 ENCOUNTER — Telehealth: Payer: Self-pay

## 2020-09-08 ENCOUNTER — Ambulatory Visit: Payer: Medicare Other | Admitting: Legal Medicine

## 2020-09-08 NOTE — Telephone Encounter (Signed)
Left a message to return my call.

## 2020-09-08 NOTE — Telephone Encounter (Signed)
-----   Message from Park Liter, MD sent at 09/06/2020  8:32 PM EST ----- Labs are looking good, stable, blood test for congestive heart failure looks good.  Continue present management

## 2020-09-11 NOTE — Telephone Encounter (Signed)
Patient returning a call to discuss lab results. Please call back  

## 2020-09-14 ENCOUNTER — Telehealth: Payer: Self-pay

## 2020-09-14 NOTE — Telephone Encounter (Signed)
Patient returned my call. Advised the following per Dr. Agustin Cree and she verbalized understanding.

## 2020-09-14 NOTE — Telephone Encounter (Signed)
Returned patient call and left a message  

## 2020-09-14 NOTE — Progress Notes (Signed)
Remote ICD transmission.   

## 2020-09-14 NOTE — Telephone Encounter (Signed)
-----   Message from Park Liter, MD sent at 09/06/2020  8:32 PM EST ----- Labs are looking good, stable, blood test for congestive heart failure looks good.  Continue present management

## 2020-09-20 ENCOUNTER — Telehealth: Payer: Self-pay | Admitting: Emergency Medicine

## 2020-09-20 NOTE — Telephone Encounter (Signed)
Please contact requesting office and get details on how many teeth will be removed/extracted.  Thank you.

## 2020-09-20 NOTE — Telephone Encounter (Signed)
Jocelyn Lamer from the requesting office returned my call and she informed me that the patient will be having 3 teeth extracted.

## 2020-09-20 NOTE — Telephone Encounter (Signed)
Called the requesting office and left a message asking if someone could give me a call back with the number of teeth that are being extracted.

## 2020-09-20 NOTE — Telephone Encounter (Signed)
   [facility] Pre-operative Risk Assessment    HEARTCARE STAFF: - Please ensure there is not already an duplicate clearance open for this procedure. - Under Visit Info/Reason for Call, type in Other and utilize the format Clearance MM/DD/YY or Clearance TBD. Do not use dashes or single digits. - If request is for dental extraction, please clarify the # of teeth to be extracted.  Request for surgical clearance:  1. What type of surgery is being performed? Oral surgery, multiple teeth   2. When is this surgery scheduled? TBD   3. What type of clearance is required (medical clearance vs. Pharmacy clearance to hold med vs. Both)? Medical   4. Are there any medications that need to be held prior to surgery and how long? None specified    5. Practice name and name of physician performing surgery? [facility]     6. What is the office phone number? [phone]   7.   What is the office fax number? [phone]  8.   Anesthesia type (None, local, MAC, general) ? IV sedation   Senaida Ores 09/20/2020, 4:05 PM  _________________________________________________________________   (provider comments below)

## 2020-09-21 NOTE — Telephone Encounter (Signed)
   Primary Cardiologist: Jenne Campus, MD  Chart reviewed as part of pre-operative protocol coverage. Simple dental extractions are considered low risk procedures per guidelines and generally do not require any specific cardiac clearance. It is also generally accepted that for simple extractions and dental cleanings, there is no need to interrupt blood thinner therapy.   SBE prophylaxis is not required for the patient.  I will route this recommendation to the requesting party via Epic fax function and remove from pre-op pool.  Please call with questions.  Deberah Pelton, NP 09/21/2020, 8:05 AM

## 2020-09-27 ENCOUNTER — Telehealth: Payer: Self-pay

## 2020-09-27 ENCOUNTER — Other Ambulatory Visit: Payer: Self-pay | Admitting: Legal Medicine

## 2020-09-27 DIAGNOSIS — E785 Hyperlipidemia, unspecified: Secondary | ICD-10-CM

## 2020-09-29 NOTE — Progress Notes (Signed)
Chronic Care Management Pharmacy Assistant   Name: [name]  MRN: [id number] DOB: Sep 11, 1955  Reason for Encounter: Medication Review for Upstream delivery  Patient Questions:  1.  Have you seen any other providers since your last visit?  09/06/19- Cardiology   2.  Any changes in your medicines or health? Patient stated she is having increase pain in her knees, back, hips.  She had surgery on her right hip and is hoping she won't have to have it on her left hip. She state the cold weather has really made her symptoms worse.    PCP : Lillard Anes, MD  Allergies:  No Known Allergies  Medications: Outpatient Encounter Medications as of 09/27/2020  Medication Sig   aspirin 81 MG tablet Take 81 mg by mouth daily.   BREO ELLIPTA 100-25 MCG/INH AEPB INHALE ONE PUFF INTO THE LUNG ONCE DAILY   Buprenorphine HCl (BELBUCA) 900 MCG FILM Place 900 mcg inside cheek every 12 (twelve) hours.   carvedilol (COREG) 3.125 MG tablet Take 1 tablet (3.125 mg total) by mouth 2 (two) times daily.   celecoxib (CELEBREX) 200 MG capsule TAKE ONE CAPSULE BY MOUTH EVERY MORNING   clobetasol ointment (TEMOVATE) 0.05 % Apply topically.   cyclobenzaprine (FLEXERIL) 10 MG tablet Take 10 mg by mouth 3 (three) times daily as needed for muscle spasms.   denosumab (PROLIA) 60 MG/ML SOSY injection Inject 60 mg into the skin every 6 (six) months.   diazepam (VALIUM) 5 MG tablet Take 1 tablet (5 mg total) by mouth 3 (three) times daily as needed.   diphenoxylate-atropine (LOMOTIL) 2.5-0.025 MG tablet Take 1 tablet by mouth 4 (four) times daily as needed for diarrhea or loose stools.   ENTRESTO 24-26 MG TAKE ONE TABLET BY MOUTH EVERY MORNING and TAKE ONE TABLET BY MOUTH EVERY EVENING   EPINEPHRINE 0.3 mg/0.3 mL IJ SOAJ injection Inject 0.3 mLs (0.3 mg total) into the muscle as needed for anaphylaxis.   esomeprazole (NEXIUM) 20 MG capsule TAKE 1 CAPSULE BY MOUTH DAILY   fludrocortisone  (FLORINEF) 0.1 MG tablet Take 1 tablet (0.1 mg total) by mouth as directed.   fluticasone (FLONASE) 50 MCG/ACT nasal spray Place 2 sprays into both nostrils daily as needed (congestion).   furosemide (LASIX) 40 MG tablet TAKE ONE TABLET BY MOUTH EVERY MORNING MAY take 0.5 tablet in THE afternoon IF needed   gabapentin (NEURONTIN) 800 MG tablet Take 1 tablet (800 mg total) by mouth 3 (three) times daily. Patient usually takes twice a day   ipratropium (ATROVENT) 0.02 % nebulizer solution Take 2.5 mLs (0.5 mg total) by nebulization every 4 (four) hours as needed for wheezing or shortness of breath.   Ketoprofen 25 MG CAPS Take by mouth.   levocetirizine (XYZAL) 5 MG tablet Take 5 mg by mouth daily as needed.   levofloxacin (LEVAQUIN) 500 MG tablet Take 1 tablet (500 mg total) by mouth daily.   LINZESS 145 MCG CAPS capsule Take 145 mcg by mouth daily.   Multiple Vitamins-Minerals (AIRBORNE GUMMIES PO) Take 3 tablets by mouth daily in the afternoon.   nitroGLYCERIN (NITROSTAT) 0.4 MG SL tablet Place 1 tablet (0.4 mg total) under the tongue every 5 (five) minutes as needed for chest pain.   ondansetron (ZOFRAN) 4 MG tablet Take 1 tablet (4 mg total) by mouth every 8 (eight) hours as needed for nausea or vomiting.   Oxycodone HCl 10 MG TABS Take 1 tablet by mouth every 4 (four) hours as  needed.   ranolazine (RANEXA) 500 MG 12 hr tablet Take 1 tablet (500 mg total) by mouth 2 (two) times daily.   rosuvastatin (CRESTOR) 20 MG tablet TAKE ONE TABLET BY MOUTH EVERY MORNING   sertraline (ZOLOFT) 100 MG tablet Take 0.5 tablets (50 mg total) by mouth daily.   tiZANidine (ZANAFLEX) 4 MG tablet Take 4 mg by mouth at bedtime.   topiramate (TOPAMAX) 50 MG tablet Take 2 tablets (100 mg total) by mouth at bedtime.   Ubrogepant (UBRELVY) 100 MG TABS Take 1 tablet by mouth as needed (May repeat 1 tablet after 2 hours if needed.  Maximum 2 tablets in 24 hours).   No facility-administered encounter  medications on file as of 09/27/2020.    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[place]) 07/13/2020   Dehydration 05/17/2020   Hypotension 05/17/2020   Persistent vomiting 05/17/2020   Diarrhea 05/17/2020   Cystitis 05/17/2020   BMI 26.0-26.9,adult 03/29/2020   Obstructive chronic bronchitis with exacerbation ([place]) 10/25/2019   Admission for long-term opiate analgesic use 10/24/2019   Presence of left artificial hip joint 10/24/2019   Senile osteoporosis 10/24/2019   Chronic hypoxemic respiratory failure ([place]) 10/24/2019   Other spondylosis with radiculopathy, lumbar region 10/24/2019   Major depressive disorder, single episode, moderate ([place]) 10/24/2019   Migraine without aura with status migrainosus 10/24/2019   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   Chronic pain of both knees 76/19/5093   Chronic systolic congestive heart failure, NYHA class 2 ([place]) 06/19/2017   Dilated cardiomyopathy ([place]) 06/19/2017   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   Arthritis of right hip 05/29/2016   Coronary artery disease involving native coronary artery of native heart without angina pectoris 05/31/2015   Dyslipidemia 05/31/2015   Essential hypertension 05/31/2015   Ileus following gastrointestinal surgery ([place]) 03/26/2015   Chronic narcotic use 03/23/2015   GERD (gastroesophageal reflux disease)    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  No OVs, Consults, or hospital visits since last care coordination call/Pharmacist visit. (If  appropriate, list visit date, provider name)  No medication changes indicated OR if recent visit, treatment plan here.  BP Readings from Last 3 Encounters:  09/05/20 (!) 96/58  08/11/20 (!) 80/50  07/27/20 115/76    No results found for: HGBA1C   Patient obtains medications through Vials  90 Days   Last adherence delivery included:  Xyzal 5 mg 1 tablet daily Diazepam 5 mg 1 tablet 3 times a day as needed Topiramate 50 mg 2 tablets at bedtime Linzess 145 mcg 1 cap every morning Esomeprazole 20 mg 1 capsule before breakfast daily  Patient declined  Epi Pen- PRN use Prolia - Has 6 months left Nitroglycerin sub 0.4 mg- PRN use Fluticasone - PRN use Ipratropium Bromide- States "another company fills this for her" Alendronate 70 mg -PCP discontinued Sertraline 50mg - Had enough on hand                     Linzess 145 mcg - Had enough on hand   Patient is due for next adherence delivery on: 10/04/20 Called patient and reviewed  medications and coordinated delivery.  This delivery to include: Xyzal 5 mg 1 tablet daily Diazepam 5 mg 1 tablet 3 times a day as needed Topiramate 50 mg 2 tablets at bedtime Esomeprazole 20 mg 1 capsule before breakfast daily Rosuvastatin 20 mg 1 tablet in the morning Breo Ellipta 100-25 mcg Celebrex 200 mg  Gabapentin 800 mg  Entresto 24-26 mg  Furosemide 40 mg   Patient will not need any acute or short fills  Patient needs refills for  Esomeprazole 20 mg   Confirmed delivery date of 10/04/20, advised patient that pharmacy will contact them the morning of delivery.  Follow-Up:  Coordination of Enhanced Pharmacy Services and Pharmacist Review  Donette Larry, CPP notified  Clarita Leber, [place] Pharmacist Assistant [phone]

## 2020-10-02 ENCOUNTER — Other Ambulatory Visit: Payer: Self-pay

## 2020-10-02 MED ORDER — ESOMEPRAZOLE MAGNESIUM 20 MG PO CPDR: 20.0000 mg | DELAYED_RELEASE_CAPSULE | Freq: Every day | ORAL | 2 refills | Status: DC

## 2020-10-03 ENCOUNTER — Other Ambulatory Visit: Payer: Self-pay

## 2020-10-03 ENCOUNTER — Other Ambulatory Visit: Payer: Self-pay | Admitting: Legal Medicine

## 2020-10-03 MED ORDER — BREO ELLIPTA 100-25 MCG/INH IN AEPB: 1.0000 | INHALATION_SPRAY | Freq: Every day | RESPIRATORY_TRACT | 1 refills | Status: DC

## 2020-10-04 ENCOUNTER — Other Ambulatory Visit: Payer: Self-pay | Admitting: Cardiology

## 2020-10-04 ENCOUNTER — Other Ambulatory Visit: Payer: Self-pay | Admitting: Legal Medicine

## 2020-10-05 ENCOUNTER — Encounter: Payer: Self-pay | Admitting: Legal Medicine

## 2020-10-05 ENCOUNTER — Telehealth (INDEPENDENT_AMBULATORY_CARE_PROVIDER_SITE_OTHER): Payer: 59 | Admitting: Legal Medicine

## 2020-10-05 ENCOUNTER — Other Ambulatory Visit: Payer: Self-pay | Admitting: Legal Medicine

## 2020-10-05 VITALS — BP 104/72 | Ht 60.0 in | Wt 130.0 lb

## 2020-10-05 DIAGNOSIS — Z20822 Contact with and (suspected) exposure to covid-19: Secondary | ICD-10-CM | POA: Diagnosis not present

## 2020-10-05 DIAGNOSIS — R6889 Other general symptoms and signs: Secondary | ICD-10-CM | POA: Diagnosis not present

## 2020-10-05 LAB — POC COVID19 BINAXNOW: SARS Coronavirus 2 Ag: NEGATIVE

## 2020-10-05 LAB — POCT INFLUENZA A/B
Influenza A, POC: NEGATIVE
Influenza B, POC: NEGATIVE

## 2020-10-05 MED ORDER — LEVOFLOXACIN 500 MG PO TABS: 500.0000 mg | ORAL_TABLET | Freq: Every day | ORAL | 0 refills | Status: DC

## 2020-10-05 NOTE — Progress Notes (Signed)
Virtual Visit via Telephone Note   This visit type was conducted due to national recommendations for restrictions regarding the COVID-19 Pandemic (e.g. social distancing) in an effort to limit this patient's exposure and mitigate transmission in our community.  Due to her co-morbid illnesses, this patient is at least at moderate risk for complications without adequate follow up.  This format is felt to be most appropriate for this patient at this time.  The patient did not have access to video technology/had technical difficulties with video requiring transitioning to audio format only (telephone).  All issues noted in this document were discussed and addressed.  No physical exam could be performed with this format.  Patient verbally consented to a telehealth visit.   Date:  10/05/2020   ID:  [name], DOB Feb 27, 1956, MRN [id number]  Patient Location: Home Provider Location: Office/Clinic  PCP:  Lillard Anes, MD   Evaluation Performed:  New Patient Evaluation  Chief Complaint:  Sick yesterday, cough, aching, chills  History of Present Illness:    [name] is a 65 y.o. female with patient had Covid 07/27/2020, Sick yesterday, cough, aching, chills, nonproductive cough.  She feels bad and is hoarse  The patient does have symptoms concerning for COVID-19 infection (fever, chills, cough, or new shortness of breath).    Past Medical History:  Diagnosis Date  . Admission for long-term opiate analgesic use 10/24/2019  . Arthritis of right hip 05/29/2016   Formatting of this note might be different from the original. Added automatically from request for surgery [phone]  . BMI 26.0-26.9,adult 03/29/2020  . Cardiomyopathy [facility])    Overview:  Ejection fraction 45% in 2015 Ejection fraction 30 to 35% in November 2018  . Chronic back pain   . Chronic hypoxemic respiratory failure ([place]) 10/24/2019  . Chronic narcotic use 03/23/2015  . Chronic pain of both knees 12/21/2018   Added  automatically from request for surgery [id number]  Formatting of this note might be different from the original. Added automatically from request for surgery [phone]  . Chronic systolic congestive heart failure, NYHA class 2 ([place]) 06/19/2017  . COPD (chronic obstructive pulmonary disease) ([place])   . Coronary artery disease involving native coronary artery of native heart without angina pectoris 05/31/2015   Overview:  Abnormal stress test in fall of 2016, cardiac catheterization showed normal coronaries.  . Cystitis 05/17/2020  . Dehydration 05/17/2020  . Diarrhea 05/17/2020  . Dilated cardiomyopathy ([place]) 06/19/2017  . 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 Dyslipidemia 05/31/2015  . Essential hypertension 05/31/2015  . GERD (gastroesophageal reflux disease)   . 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 Hypotension 05/17/2020  . ICD (implantable cardiac defibrillator) in place 2012  . 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 Ileus following gastrointestinal surgery ([place]) 03/26/2015  . Major depressive disorder, single episode, moderate ([place]) 10/24/2019  . Migraine without aura with status migrainosus 10/24/2019  . Nausea alone 04/19/2014  . Obstructive chronic bronchitis with exacerbation ([place]) 10/25/2019  . Other spondylosis with radiculopathy, lumbar region 10/24/2019  . Persistent vomiting 05/17/2020  . Presence of left artificial hip joint 10/24/2019  . PTSD (post-traumatic stress disorder)   . 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 Senile osteoporosis 10/24/2019  . Thyroid disease     Past Surgical History:  Procedure Laterality Date  . APPENDECTOMY    . CARDIAC CATHETERIZATION    . CARPAL TUNNEL  RELEASE    . CESAREAN SECTION    . CHOLECYSTECTOMY    . CORONARY ANGIOPLASTY    . HAND SURGERY    . ICD GENERATOR CHANGEOUT N/A 05/21/2019   Procedure:  ICD GENERATOR CHANGEOUT;  Surgeon: Constance Haw, MD;  Location: [facility];  Service: Cardiovascular;  Laterality: N/A;  . ICD IMPLANT     Medtronic  . intestinal blockage 2011    . LAPAROSCOPIC ASSISTED VENTRAL HERNIA REPAIR N/A 03/23/2015   Procedure: LAPAROSCOPIC VENTRAL WALL HERNIA REPAIR;  Surgeon: Michael Boston, MD;  Location: [facility];  Service: General;  Laterality: N/A;  With MESH  . LAPAROSCOPIC LYSIS OF ADHESIONS N/A 03/23/2015   Procedure: LAPAROSCOPIC LYSIS OF ADHESIONS;  Surgeon: Michael Boston, MD;  Location: [facility];  Service: General;  Laterality: N/A;  . NECK SURGERY     fused  . TONSILLECTOMY    . ULNAR NERVE TRANSPOSITION  01/23/2012   Procedure: ULNAR NERVE DECOMPRESSION/TRANSPOSITION;  Surgeon: Ophelia Charter, MD;  Location: [facility];  Service: Neurosurgery;  Laterality: Left;  LEFT ulnar nerve decompression    Family History  Problem Relation Age of Onset  . Heart attack Other   . Cancer Other   . Heart failure Other   . Hypertension Mother   . Heart attack Mother   . Alcohol abuse Father   . Hypertension Father   . Heart attack Father   . Anesthesia problems Neg Hx   . Hypotension Neg Hx   . Malignant hyperthermia Neg Hx   . Pseudochol deficiency Neg Hx     Social History   Socioeconomic History  . Marital status: Single    Spouse name: Not on file  . Number of children: 3  . Years of education: Not on file  . Highest education level: Not on file  Occupational History  . Occupation: disabled  Tobacco Use  . Smoking status: Former Smoker    Packs/day: 0.50    Years: 30.00    Pack years: 15.00    Types: Cigarettes    Quit date: 03/2020    Years since quitting: 0.5  . Smokeless tobacco: Never Used  Vaping Use  . Vaping Use: Never used  Substance and Sexual Activity  . Alcohol use: Yes    Alcohol/week: 2.0 standard drinks    Types: 1 Cans of beer, 1 Standard drinks or equivalent per week    Comment: seldom  . Drug use: No  .  Sexual activity: Not Currently  Other Topics Concern  . Not on file  Social History Narrative   One level home with boyfriend   Caffeine - coffee 1-2 cups/day; Green tea 4-5 bottles a day   Exercise - some    Right handed      Social Determinants of Health   Financial Resource Strain: Not on file  Food Insecurity: Not on file  Transportation Needs: Not on file  Physical Activity: Not on file  Stress: Not on file  Social Connections: Not on file  Intimate Partner Violence: Not on file    Outpatient Medications Prior to Visit  Medication Sig Dispense Refill  . aspirin 81 MG tablet Take 81 mg by mouth daily.    . Buprenorphine HCl (BELBUCA) 900 MCG FILM Place 900 mcg inside cheek every 12 (twelve) hours.    . carvedilol (COREG) 3.125 MG tablet TAKE 1 TABLET BY MOUTH TWICE DAILY 180 tablet 2  . celecoxib (CELEBREX) 200 MG capsule TAKE ONE CAPSULE BY MOUTH  EVERY MORNING 90 capsule 2  . clobetasol ointment (TEMOVATE) 0.05 % Apply topically.    . cyclobenzaprine (FLEXERIL) 10 MG tablet Take 10 mg by mouth 3 (three) times daily as needed for muscle spasms.    Marland Kitchen denosumab (PROLIA) 60 MG/ML SOSY injection Inject 60 mg into the skin every 6 (six) months. 180 mL 2  . diazepam (VALIUM) 5 MG tablet Take 1 tablet (5 mg total) by mouth 3 (three) times daily as needed. 30 tablet 3  . diphenoxylate-atropine (LOMOTIL) 2.5-0.025 MG tablet Take 1 tablet by mouth 4 (four) times daily as needed for diarrhea or loose stools. 30 tablet 0  . ENTRESTO 24-26 MG TAKE ONE TABLET BY MOUTH EVERY MORNING and TAKE ONE TABLET BY MOUTH EVERY EVENING 180 tablet 2  . EPINEPHRINE 0.3 mg/0.3 mL IJ SOAJ injection Inject 0.3 mLs (0.3 mg total) into the muscle as needed for anaphylaxis. 1 each 2  . esomeprazole (NEXIUM) 20 MG capsule Take 1 capsule (20 mg total) by mouth daily. 90 capsule 2  . fludrocortisone (FLORINEF) 0.1 MG tablet Take 1 tablet (0.1 mg total) by mouth as directed. 30 tablet 3  . fluticasone (FLONASE) 50  MCG/ACT nasal spray Place 2 sprays into both nostrils daily as needed (congestion). 18 mL 2  . fluticasone furoate-vilanterol (BREO ELLIPTA) 100-25 MCG/INH AEPB Inhale 1 puff into the lungs daily in the afternoon. 90 each 1  . furosemide (LASIX) 40 MG tablet TAKE ONE TABLET BY MOUTH EVERY MORNING MAY take 0.5 tablet in THE afternoon IF needed 90 tablet 2  . gabapentin (NEURONTIN) 800 MG tablet Take 1 tablet (800 mg total) by mouth 3 (three) times daily. Patient usually takes twice a day 270 tablet 2  . ipratropium (ATROVENT) 0.02 % nebulizer solution Take 2.5 mLs (0.5 mg total) by nebulization every 4 (four) hours as needed for wheezing or shortness of breath. 150 mL 2  . Ketoprofen 25 MG CAPS Take by mouth.    . levocetirizine (XYZAL) 5 MG tablet Take 5 mg by mouth daily as needed.    Marland Kitchen levofloxacin (LEVAQUIN) 500 MG tablet Take 1 tablet (500 mg total) by mouth daily. 7 tablet 0  . LINZESS 145 MCG CAPS capsule Take 145 mcg by mouth daily.    . Multiple Vitamins-Minerals (AIRBORNE GUMMIES PO) Take 3 tablets by mouth daily in the afternoon.    . nitroGLYCERIN (NITROSTAT) 0.4 MG SL tablet Place 1 tablet (0.4 mg total) under the tongue every 5 (five) minutes as needed for chest pain. 25 tablet 6  . ondansetron (ZOFRAN) 4 MG tablet Take 1 tablet (4 mg total) by mouth every 8 (eight) hours as needed for nausea or vomiting. 20 tablet 0  . Oxycodone HCl 10 MG TABS Take 1 tablet by mouth every 4 (four) hours as needed.    . ranolazine (RANEXA) 500 MG 12 hr tablet TAKE 1 TABLET(500 MG) BY MOUTH TWICE DAILY 180 tablet 2  . rosuvastatin (CRESTOR) 20 MG tablet TAKE ONE TABLET BY MOUTH EVERY MORNING 90 tablet 2  . sertraline (ZOLOFT) 100 MG tablet Take 0.5 tablets (50 mg total) by mouth daily. 90 tablet 2  . tiZANidine (ZANAFLEX) 4 MG tablet Take 4 mg by mouth at bedtime.    . topiramate (TOPAMAX) 50 MG tablet Take 2 tablets (100 mg total) by mouth at bedtime. 240 tablet 2  . Ubrogepant (UBRELVY) 100 MG TABS  Take 1 tablet by mouth as needed (May repeat 1 tablet after 2 hours if needed.  Maximum 2 tablets in 24 hours). 16 tablet 6  . LORazepam (ATIVAN) 2 MG tablet TAKE 1 TABLET BY MOUTH 1 HOUR PRE-OP WITH SIP OF WATER ONLY     No facility-administered medications prior to visit.    Allergies:   Patient has no known allergies.   Social History   Tobacco Use  . Smoking status: Former Smoker    Packs/day: 0.50    Years: 30.00    Pack years: 15.00    Types: Cigarettes    Quit date: 03/2020    Years since quitting: 0.5  . Smokeless tobacco: Never Used  Vaping Use  . Vaping Use: Never used  Substance Use Topics  . Alcohol use: Yes    Alcohol/week: 2.0 standard drinks    Types: 1 Cans of beer, 1 Standard drinks or equivalent per week    Comment: seldom  . Drug use: No     Review of Systems  Constitutional: Negative for chills and malaise/fatigue.  HENT: Positive for congestion.   Eyes: Negative for redness.  Respiratory: Positive for cough.   Cardiovascular: Negative for chest pain, palpitations, orthopnea and claudication.  Genitourinary: Negative for dysuria.  Musculoskeletal: Positive for myalgias.  Skin: Negative for itching and rash.  Neurological: Positive for headaches.  Psychiatric/Behavioral: Negative.      Labs/Other Tests and Data Reviewed:    Recent Labs: 07/05/2020: ALT 6; Hemoglobin 10.9; Platelets 324; TSH 0.987 09/05/2020: BUN 17; Creatinine, Ser 1.15; NT-Pro BNP 546; Potassium 4.5; Sodium 142   Recent Lipid Panel Lab Results  Component Value Date/Time   CHOL 141 07/05/2020 12:14 PM   TRIG 127 07/05/2020 12:14 PM   HDL 46 07/05/2020 12:14 PM   CHOLHDL 3.1 07/05/2020 12:14 PM   LDLCALC 72 07/05/2020 12:14 PM    Wt Readings from Last 3 Encounters:  10/05/20 130 lb (59 kg)  09/05/20 126 lb (57.2 kg)  08/11/20 128 lb (58.1 kg)     Objective:    Vital Signs:  BP 104/72   Ht 5' (1.524 m)   Wt 130 lb (59 kg)   BMI 25.39 kg/m    Physical Exam vs  reviewed  ASSESSMENT & PLAN:   Diagnoses and all orders for this visit: Flu-like symptoms -     Influenza A/B Flu test negative, treat with Levaquin Close exposure to COVID-19 virus -     POC COVID-19 Rapid covid test negative        COVID-19 Education: The signs and symptoms of COVID-19 were discussed with the patient and how to seek care for testing (follow up with PCP or arrange E-visit). The importance of social distancing was discussed today.   I spent 55minutes dedicated to the care of this patient on the date of this encounter to include face-to-face time with the patient, as well as:   Follow Up:  In Person prn  Signed,  Reinaldo Meeker, MD  10/05/2020 11:34 AM    [place]

## 2020-10-05 NOTE — Progress Notes (Signed)
I called in Levaquin lp

## 2020-10-08 NOTE — Progress Notes (Deleted)
NEUROLOGY FOLLOW UP OFFICE NOTE  [name] [id number]   Subjective:  [name] is a 65 year old Caucasian woman with CHF with EF 30-35%, s/p AICD, COPD, chronic back pain, chronic knee pain, arthritis, HTN,hyperlipidemiaand PTSD and history of MI and former smokerand history of childhood seizureswhofollows up for migraines and dizziness.  UPDATE: Gabapentin ?  Topiramate was increased last year.  Intensity:severe Duration:All day. 1 hour if takes [place] immediately.  Frequency:every other day. Frequency of abortive medication:sumatriptan daily Current NSAIDS:ASA 81mg ; Celebrex Current analgesics:none Current triptans:none Current ergotamine:none Current anti-emetic:none Current muscle relaxants:none Current anti-anxiolytic:Valium Current sleep aide:none Current Antihypertensive medications:Coreg, Lasix Current Antidepressant medications:none Current Anticonvulsant medications:Topiramate 100mg  at bedtime; gabapentin800mg  twice daily Current anti-CGRP:Ubrelvy 100mg  Current Vitamins/Herbal/Supplements:none Current Antihistamines/Decongestants:Flonase Other therapy:none Hormone/birth control:none  Caffeine:3 cups of coffee daily Alcohol:no Smoker:former Diet:100 oz water daily. No soda.  Exercise:no  HISTORY: She has had headaches since around 2017 but gradually got worse, particularly since November 2019.They are severe bifrontal pounding/squeezing headache. There is noaura.They are associated withphotophobia, phonophobia, blurred vision and dizziness.They typically last 1 hourand occur 5 to 6 a day every day. They are triggered by loud noise.Sumatriptan helps relieve the intensity but does not abort it..  Since November 2019, shealso has had episodes of dizziness outside of the dizziness as well,causing her to fall. She reports sudden spinning sensation lasting a few seconds and resolves.  She says it is not positional. She has associated nausea and blurred vision. She says it occurs 2 to 3 times a day.   She had a CT of head without contrast on 11/02/18 which was personally reviewed and was unremarkable, demonstrating mild atrophy and mild chronic small vessel ischemic changes but no acute intracranial abnormality.  She is treated by pain management for chronic pain. She has degenerative disc disease of the cervical spine with previous neck fusion. She has history of total right hip replacement. She has chronic low back pain with radicular symptoms. She has bilateral knee pain.  Past NSAIDS:none Past analgesics:Tylenol (contraindicated due to CHF) Past abortive triptans:sumatriptan 25mg  Past abortive ergotamine:none Past muscle relaxants:none Past anti-emetic:none Past antihypertensive medications:lisinopril Past antidepressant medications:none Past anticonvulsant medications:none Past anti-CGRP:none Past vitamins/Herbal/Supplements:none Past antihistamines/decongestants:none Other past therapies:none  PAST MEDICAL HISTORY: Past Medical History:  Diagnosis Date  . Admission for long-term opiate analgesic use 10/24/2019  . Arthritis of right hip 05/29/2016   Formatting of this note might be different from the original. Added automatically from request for surgery [phone]  . BMI 26.0-26.9,adult 03/29/2020  . Cardiomyopathy [facility])    Overview:  Ejection fraction 45% in 2015 Ejection fraction 30 to 35% in November 2018  . Chronic back pain   . Chronic hypoxemic respiratory failure ([place]) 10/24/2019  . Chronic narcotic use 03/23/2015  . Chronic pain of both knees 12/21/2018   Added automatically from request for surgery [id number]  Formatting of this note might be different from the original. Added automatically from request for surgery [phone]  . Chronic systolic congestive heart failure, NYHA class 2 ([place]) 06/19/2017  . COPD (chronic obstructive  pulmonary disease) ([place])   . Coronary artery disease involving native coronary artery of native heart without angina pectoris 05/31/2015   Overview:  Abnormal stress test in fall of 2016, cardiac catheterization showed normal coronaries.  . Cystitis 05/17/2020  . Dehydration 05/17/2020  . Diarrhea 05/17/2020  . Dilated cardiomyopathy ([place]) 06/19/2017  . 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 Dyslipidemia 05/31/2015  .  Essential hypertension 05/31/2015  . GERD (gastroesophageal reflux disease)   . 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[place]) 07/13/2020  . Hypotension 05/17/2020  . ICD (implantable cardiac defibrillator) in place 2012  . 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 Ileus following gastrointestinal surgery ([place]) 03/26/2015  . Major depressive disorder, single episode, moderate ([place]) 10/24/2019  . Migraine without aura with status migrainosus 10/24/2019  . Nausea alone 04/19/2014  . Obstructive chronic bronchitis with exacerbation ([place]) 10/25/2019  . Other spondylosis with radiculopathy, lumbar region 10/24/2019  . Persistent vomiting 05/17/2020  . Presence of left artificial hip joint 10/24/2019  . PTSD (post-traumatic stress disorder)   . 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 Senile osteoporosis 10/24/2019  . Thyroid disease     MEDICATIONS: Current Outpatient Medications on File Prior to Visit  Medication Sig Dispense Refill  . aspirin 81 MG tablet Take 81 mg by mouth daily.    . Buprenorphine HCl (BELBUCA) 900 MCG FILM Place 900 mcg inside cheek every 12 (twelve) hours.    . carvedilol (COREG) 3.125 MG tablet TAKE 1 TABLET BY MOUTH TWICE DAILY 180 tablet 2  . celecoxib (CELEBREX) 200 MG capsule TAKE ONE CAPSULE BY MOUTH EVERY MORNING 90 capsule 2  . clobetasol ointment (TEMOVATE) 0.05 % Apply topically.    . cyclobenzaprine (FLEXERIL) 10 MG tablet Take  10 mg by mouth 3 (three) times daily as needed for muscle spasms.    Marland Kitchen denosumab (PROLIA) 60 MG/ML SOSY injection Inject 60 mg into the skin every 6 (six) months. 180 mL 2  . diazepam (VALIUM) 5 MG tablet Take 1 tablet (5 mg total) by mouth 3 (three) times daily as needed. 30 tablet 3  . diphenoxylate-atropine (LOMOTIL) 2.5-0.025 MG tablet Take 1 tablet by mouth 4 (four) times daily as needed for diarrhea or loose stools. 30 tablet 0  . ENTRESTO 24-26 MG TAKE ONE TABLET BY MOUTH EVERY MORNING and TAKE ONE TABLET BY MOUTH EVERY EVENING 180 tablet 2  . EPINEPHRINE 0.3 mg/0.3 mL IJ SOAJ injection Inject 0.3 mLs (0.3 mg total) into the muscle as needed for anaphylaxis. 1 each 2  . esomeprazole (NEXIUM) 20 MG capsule Take 1 capsule (20 mg total) by mouth daily. 90 capsule 2  . fludrocortisone (FLORINEF) 0.1 MG tablet Take 1 tablet (0.1 mg total) by mouth as directed. 30 tablet 3  . fluticasone (FLONASE) 50 MCG/ACT nasal spray Place 2 sprays into both nostrils daily as needed (congestion). 18 mL 2  . fluticasone furoate-vilanterol (BREO ELLIPTA) 100-25 MCG/INH AEPB Inhale 1 puff into the lungs daily in the afternoon. 90 each 1  . furosemide (LASIX) 40 MG tablet TAKE ONE TABLET BY MOUTH EVERY MORNING MAY take 0.5 tablet in THE afternoon IF needed 90 tablet 2  . gabapentin (NEURONTIN) 800 MG tablet Take 1 tablet (800 mg total) by mouth 3 (three) times daily. Patient usually takes twice a day 270 tablet 2  . ipratropium (ATROVENT) 0.02 % nebulizer solution Take 2.5 mLs (0.5 mg total) by nebulization every 4 (four) hours as needed for wheezing or shortness of breath. 150 mL 2  . Ketoprofen 25 MG CAPS Take by mouth.    . levocetirizine (XYZAL) 5 MG tablet Take 5 mg by mouth daily as needed.    Marland Kitchen levofloxacin (LEVAQUIN) 500 MG tablet Take 1 tablet (500 mg total) by mouth daily. 7 tablet 0  . LINZESS 145 MCG CAPS capsule Take 145 mcg by mouth daily.    Marland Kitchen  Multiple Vitamins-Minerals (AIRBORNE GUMMIES PO) Take 3  tablets by mouth daily in the afternoon.    . nitroGLYCERIN (NITROSTAT) 0.4 MG SL tablet Place 1 tablet (0.4 mg total) under the tongue every 5 (five) minutes as needed for chest pain. 25 tablet 6  . ondansetron (ZOFRAN) 4 MG tablet Take 1 tablet (4 mg total) by mouth every 8 (eight) hours as needed for nausea or vomiting. 20 tablet 0  . Oxycodone HCl 10 MG TABS Take 1 tablet by mouth every 4 (four) hours as needed.    . ranolazine (RANEXA) 500 MG 12 hr tablet TAKE 1 TABLET(500 MG) BY MOUTH TWICE DAILY 180 tablet 2  . rosuvastatin (CRESTOR) 20 MG tablet TAKE ONE TABLET BY MOUTH EVERY MORNING 90 tablet 2  . sertraline (ZOLOFT) 100 MG tablet Take 0.5 tablets (50 mg total) by mouth daily. 90 tablet 2  . tiZANidine (ZANAFLEX) 4 MG tablet Take 4 mg by mouth at bedtime.    . topiramate (TOPAMAX) 50 MG tablet Take 2 tablets (100 mg total) by mouth at bedtime. 240 tablet 2  . Ubrogepant (UBRELVY) 100 MG TABS Take 1 tablet by mouth as needed (May repeat 1 tablet after 2 hours if needed.  Maximum 2 tablets in 24 hours). 16 tablet 6   No current facility-administered medications on file prior to visit.    ALLERGIES: No Known Allergies  FAMILY HISTORY: Family History  Problem Relation Age of Onset  . Heart attack Other   . Cancer Other   . Heart failure Other   . Hypertension Mother   . Heart attack Mother   . Alcohol abuse Father   . Hypertension Father   . Heart attack Father   . Anesthesia problems Neg Hx   . Hypotension Neg Hx   . Malignant hyperthermia Neg Hx   . Pseudochol deficiency Neg Hx    ***.  SOCIAL HISTORY: Social History   Socioeconomic History  . Marital status: Single    Spouse name: Not on file  . Number of children: 3  . Years of education: Not on file  . Highest education level: Not on file  Occupational History  . Occupation: disabled  Tobacco Use  . Smoking status: Former Smoker    Packs/day: 0.50    Years: 30.00    Pack years: 15.00    Types: Cigarettes     Quit date: 03/2020    Years since quitting: 0.5  . Smokeless tobacco: Never Used  Vaping Use  . Vaping Use: Never used  Substance and Sexual Activity  . Alcohol use: Yes    Alcohol/week: 2.0 standard drinks    Types: 1 Cans of beer, 1 Standard drinks or equivalent per week    Comment: seldom  . Drug use: No  . Sexual activity: Not Currently  Other Topics Concern  . Not on file  Social History Narrative   One level home with boyfriend   Caffeine - coffee 1-2 cups/day; Green tea 4-5 bottles a day   Exercise - some    Right handed      Social Determinants of Health   Financial Resource Strain: Not on file  Food Insecurity: Not on file  Transportation Needs: Not on file  Physical Activity: Not on file  Stress: Not on file  Social Connections: Not on file  Intimate Partner Violence: Not on file     Objective:  *** General: No acute distress.  Patient appears well-groomed.   Head:  Normocephalic/atraumatic Eyes:  Fundi  examined but not visualized Neck: supple, no paraspinal tenderness, full range of motion Heart:  Regular rate and rhythm Lungs:  Clear to auscultation bilaterally Back: No paraspinal tenderness Neurological Exam: alert and oriented to person, place, and time. Attention span and concentration intact, recent and remote memory intact, fund of knowledge intact.  Speech fluent and not dysarthric, language intact.  CN II-XII intact. Bulk and tone normal, muscle strength 5/5 throughout.  Sensation to light touch, temperature and vibration intact.  Deep tendon reflexes 2+ throughout, toes downgoing.  Finger to nose and heel to shin testing intact.  Gait normal, Romberg negative.   Assessment/Plan:   1.  Chronic migraine without aura, without status migrainosus, not intractable, improved with increased topiramate. 2.  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 I recommend discussing with prescribing provider about tapering off of gabapentin to see if  myoclonus resolves.  2. For preventative management, topiramate 100mg  at bedtime 3.  Limit use of pain relievers to no more than 2 days out of week to prevent risk of rebound or medication-overuse headache. 4.  Keep headache diary 5.  Exercise, hydration, caffeine cessation, sleep hygiene, monitor for and avoid triggers 6. Follow up 4 months   Crystal Clines, DO  CC: Lillard Anes, MD

## 2020-10-09 ENCOUNTER — Ambulatory Visit: Payer: 59 | Admitting: Neurology

## 2020-10-12 ENCOUNTER — Other Ambulatory Visit: Payer: Self-pay | Admitting: Legal Medicine

## 2020-10-12 DIAGNOSIS — E2740 Unspecified adrenocortical insufficiency: Secondary | ICD-10-CM

## 2020-10-16 ENCOUNTER — Encounter: Payer: Self-pay | Admitting: Legal Medicine

## 2020-10-16 ENCOUNTER — Other Ambulatory Visit: Payer: Self-pay

## 2020-10-16 ENCOUNTER — Telehealth (INDEPENDENT_AMBULATORY_CARE_PROVIDER_SITE_OTHER): Payer: 59 | Admitting: Legal Medicine

## 2020-10-16 VITALS — BP 80/58 | HR 94 | Temp 98.3°F | Resp 17 | Ht 60.0 in | Wt 128.5 lb

## 2020-10-16 DIAGNOSIS — E440 Moderate protein-calorie malnutrition: Secondary | ICD-10-CM | POA: Diagnosis not present

## 2020-10-16 DIAGNOSIS — N3946 Mixed incontinence: Secondary | ICD-10-CM

## 2020-10-16 DIAGNOSIS — K559 Vascular disorder of intestine, unspecified: Secondary | ICD-10-CM | POA: Insufficient documentation

## 2020-10-16 DIAGNOSIS — R1084 Generalized abdominal pain: Secondary | ICD-10-CM | POA: Diagnosis not present

## 2020-10-16 HISTORY — DX: Vascular disorder of intestine, unspecified: K55.9

## 2020-10-16 NOTE — Progress Notes (Addendum)
Subjective:  Patient ID: [name], female    DOB: 1956/02/08  Age: 65 y.o. MRN: [id number]  Chief Complaint  Patient presents with  . Abdominal Pain    Patient has had abdominal pain for 3 weeks around belly bottom and lower abdomen area.  patient seen in person:  HPI: pain in lower abdomen for 3 weeks, unable to vomiting, some BM. No blood, no fever or chills.  Patient has chronic incontinance   No current facility-administered medications on file prior to visit.   Current Outpatient Medications on File Prior to Visit  Medication Sig Dispense Refill  . aspirin 81 MG tablet Take 81 mg by mouth daily.    . Buprenorphine HCl (BELBUCA) 900 MCG FILM Place 900 mcg inside cheek every 12 (twelve) hours.    . carvedilol (COREG) 3.125 MG tablet TAKE 1 TABLET BY MOUTH TWICE DAILY 180 tablet 2  . celecoxib (CELEBREX) 200 MG capsule TAKE ONE CAPSULE BY MOUTH EVERY MORNING 90 capsule 2  . cyclobenzaprine (FLEXERIL) 10 MG tablet Take 10 mg by mouth 3 (three) times daily as needed for muscle spasms.    Marland Kitchen denosumab (PROLIA) 60 MG/ML SOSY injection Inject 60 mg into the skin every 6 (six) months. 180 mL 2  . diazepam (VALIUM) 5 MG tablet Take 1 tablet (5 mg total) by mouth 3 (three) times daily as needed. (Patient taking differently: Take 5 mg by mouth 3 (three) times daily as needed for anxiety.) 30 tablet 3  . diphenoxylate-atropine (LOMOTIL) 2.5-0.025 MG tablet Take 1 tablet by mouth 4 (four) times daily as needed for diarrhea or loose stools. 30 tablet 0  . ENTRESTO 24-26 MG TAKE ONE TABLET BY MOUTH EVERY MORNING and TAKE ONE TABLET BY MOUTH EVERY EVENING 180 tablet 2  . EPINEPHRINE 0.3 mg/0.3 mL IJ SOAJ injection Inject 0.3 mLs (0.3 mg total) into the muscle as needed for anaphylaxis. 1 each 2  . esomeprazole (NEXIUM) 20 MG capsule Take 1 capsule (20 mg total) by mouth daily. 90 capsule 2  . fludrocortisone (FLORINEF) 0.1 MG tablet TAKE 1 TABLET BY MOUTH AS DIRECTED. (Patient taking  differently: Take 0.1 mg by mouth daily.) 30 tablet 3  . fluticasone (FLONASE) 50 MCG/ACT nasal spray Place 2 sprays into both nostrils daily as needed (congestion). 18 mL 2  . fluticasone furoate-vilanterol (BREO ELLIPTA) 100-25 MCG/INH AEPB Inhale 1 puff into the lungs daily in the afternoon. 90 each 1  . furosemide (LASIX) 40 MG tablet TAKE ONE TABLET BY MOUTH EVERY MORNING MAY take 0.5 tablet in THE afternoon IF needed (Patient taking differently: Take 20 mg by mouth daily as needed for fluid.) 90 tablet 2  . gabapentin (NEURONTIN) 800 MG tablet Take 1 tablet (800 mg total) by mouth 3 (three) times daily. Patient usually takes twice a day (Patient taking differently: Take 800 mg by mouth 2 (two) times daily as needed (pain).) 270 tablet 2  . ipratropium (ATROVENT) 0.02 % nebulizer solution Take 2.5 mLs (0.5 mg total) by nebulization every 4 (four) hours as needed for wheezing or shortness of breath. 150 mL 2  . levocetirizine (XYZAL) 5 MG tablet Take 5 mg by mouth daily as needed for allergies.    Marland Kitchen LINZESS 145 MCG CAPS capsule Take 145 mcg by mouth daily as needed (indigestion).    . nitroGLYCERIN (NITROSTAT) 0.4 MG SL tablet Place 1 tablet (0.4 mg total) under the tongue every 5 (five) minutes as needed for chest pain. 25 tablet 6  .  ondansetron (ZOFRAN) 4 MG tablet Take 1 tablet (4 mg total) by mouth every 8 (eight) hours as needed for nausea or vomiting. 20 tablet 0  . Oxycodone HCl 10 MG TABS Take 1 tablet by mouth every 4 (four) hours as needed (pain).    . ranolazine (RANEXA) 500 MG 12 hr tablet TAKE 1 TABLET(500 MG) BY MOUTH TWICE DAILY 180 tablet 2  . rosuvastatin (CRESTOR) 20 MG tablet TAKE ONE TABLET BY MOUTH EVERY MORNING 90 tablet 2  . sertraline (ZOLOFT) 100 MG tablet Take 0.5 tablets (50 mg total) by mouth daily. 90 tablet 2  . tiZANidine (ZANAFLEX) 4 MG tablet Take 4 mg by mouth at bedtime.    . topiramate (TOPAMAX) 50 MG tablet Take 2 tablets (100 mg total) by mouth at bedtime.  240 tablet 2  . Ubrogepant (UBRELVY) 100 MG TABS Take 1 tablet by mouth as needed (May repeat 1 tablet after 2 hours if needed.  Maximum 2 tablets in 24 hours). (Patient taking differently: Take 1 tablet by mouth every 2 (two) hours as needed (May repeat 1 tablet after 2 hours if needed.  Maximum 2 tablets in 24 hours).) 16 tablet 6   Past Medical History:  Diagnosis Date  . Admission for long-term opiate analgesic use 10/24/2019  . Arthritis of right hip 05/29/2016   Formatting of this note might be different from the original. Added automatically from request for surgery [id number]  . BMI 26.0-26.9,adult 03/29/2020  . Cardiomyopathy [facility])    Overview:  Ejection fraction 45% in 2015 Ejection fraction 30 to 35% in November 2018  . Chronic back pain   . Chronic hypoxemic respiratory failure ([place]) 10/24/2019  . Chronic narcotic use 03/23/2015  . Chronic pain of both knees 12/21/2018   Added automatically from request for surgery [id number]  Formatting of this note might be different from the original. Added automatically from request for surgery [id number]  . Chronic systolic congestive heart failure, NYHA class 2 ([place]) 06/19/2017  . COPD (chronic obstructive pulmonary disease) ([place])   . Coronary artery disease involving native coronary artery of native heart without angina pectoris 05/31/2015   Overview:  Abnormal stress test in fall of 2016, cardiac catheterization showed normal coronaries.  . Cystitis 05/17/2020  . Dehydration 05/17/2020  . Diarrhea 05/17/2020  . Dilated cardiomyopathy ([place]) 06/19/2017  . 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 Dyslipidemia 05/31/2015  . Essential hypertension 05/31/2015  . GERD (gastroesophageal reflux disease)   . 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[place]) 07/13/2020  . Hypotension 05/17/2020  . ICD (implantable cardiac defibrillator) in place 2012  . 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 Ileus  following gastrointestinal surgery ([place]) 03/26/2015  . Major depressive disorder, single episode, moderate ([place]) 10/24/2019  . Migraine without aura with status migrainosus 10/24/2019  . Nausea alone 04/19/2014  . Obstructive chronic bronchitis with exacerbation ([place]) 10/25/2019  . Other spondylosis with radiculopathy, lumbar region 10/24/2019  . Persistent vomiting 05/17/2020  . Presence of left artificial hip joint 10/24/2019  . PTSD (post-traumatic stress disorder)   . 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 Senile osteoporosis 10/24/2019  . Thyroid disease    Past Surgical History:  Procedure Laterality Date  . APPENDECTOMY    . CARDIAC CATHETERIZATION    . CARPAL TUNNEL RELEASE    . CESAREAN SECTION    . CHOLECYSTECTOMY    . CORONARY ANGIOPLASTY    . HAND SURGERY    .  ICD GENERATOR CHANGEOUT N/A 05/21/2019   Procedure: ICD GENERATOR CHANGEOUT;  Surgeon: Constance Haw, MD;  Location: [place] CV LAB;  Service: Cardiovascular;  Laterality: N/A;  . ICD IMPLANT     Medtronic  . intestinal blockage 2011    . LAPAROSCOPIC ASSISTED VENTRAL HERNIA REPAIR N/A 03/23/2015   Procedure: LAPAROSCOPIC VENTRAL WALL HERNIA REPAIR;  Surgeon: Michael Boston, MD;  Location[facility];  Service: General;  Laterality: N/A;  With MESH  . LAPAROSCOPIC LYSIS OF ADHESIONS N/A 03/23/2015   Procedure: LAPAROSCOPIC LYSIS OF ADHESIONS;  Surgeon: Michael Boston, MD;  Location: [facility];  Service: General;  Laterality: N/A;  . NECK SURGERY     fused  . TONSILLECTOMY    . ULNAR NERVE TRANSPOSITION  01/23/2012   Procedure: ULNAR NERVE DECOMPRESSION/TRANSPOSITION;  Surgeon: Ophelia Charter, MD;  Location: [place] NEURO ORS;  Service: Neurosurgery;  Laterality: Left;  LEFT ulnar nerve decompression    Family History  Problem Relation Age of Onset  . Heart attack Other   . Cancer Other   . Heart failure Other   . Hypertension Mother   . Heart attack Mother   . Alcohol  abuse Father   . Hypertension Father   . Heart attack Father   . Anesthesia problems Neg Hx   . Hypotension Neg Hx   . Malignant hyperthermia Neg Hx   . Pseudochol deficiency Neg Hx    Social History   Socioeconomic History  . Marital status: Single    Spouse name: Not on file  . Number of children: 3  . Years of education: Not on file  . Highest education level: Not on file  Occupational History  . Occupation: disabled  Tobacco Use  . Smoking status: Former Smoker    Packs/day: 0.50    Years: 30.00    Pack years: 15.00    Types: Cigarettes    Quit date: 03/2020    Years since quitting: 0.5  . Smokeless tobacco: Never Used  Vaping Use  . Vaping Use: Never used  Substance and Sexual Activity  . Alcohol use: Yes    Alcohol/week: 2.0 standard drinks    Types: 1 Cans of beer, 1 Standard drinks or equivalent per week    Comment: seldom  . Drug use: No  . Sexual activity: Not Currently  Other Topics Concern  . Not on file  Social History Narrative   One level home with boyfriend   Caffeine - coffee 1-2 cups/day; Green tea 4-5 bottles a day   Exercise - some    Right handed      Social Determinants of Health   Financial Resource Strain: Not on file  Food Insecurity: Not on file  Transportation Needs: Not on file  Physical Activity: Not on file  Stress: Not on file  Social Connections: Not on file    Review of Systems  Constitutional: Positive for appetite change and unexpected weight change. Negative for activity change.  Eyes: Negative for visual disturbance.  Respiratory: Negative for apnea, chest tightness and shortness of breath.   Cardiovascular: Negative for chest pain, palpitations and leg swelling.  Gastrointestinal: Positive for abdominal distention and abdominal pain (diffuse).  Endocrine: Negative for polyuria.  Genitourinary: Negative for difficulty urinating and dysuria.  Musculoskeletal: Negative for arthralgias and back pain.  Neurological:  Negative.   Psychiatric/Behavioral: Negative.      Objective:  BP (!) 80/58 (BP Location: Right Arm, Patient Position: Sitting, Cuff Size: Normal)   Pulse 94  Temp 98.3 F (36.8 C)   Resp 17   Ht 5' (1.524 m)   Wt 128 lb 8 oz (58.3 kg)   SpO2 100%   BMI 25.10 kg/m   BP/Weight 10/20/2020 10/19/2020 01/25/931  Systolic BP 355 - -  Diastolic BP 58 - -  Wt. (Lbs) - 136.02 -  BMI - - 26.57    Physical Exam Vitals reviewed.  Constitutional:      Appearance: She is well-developed.  HENT:     Mouth/Throat:     Mouth: Mucous membranes are moist.     Pharynx: Oropharynx is clear.  Eyes:     Extraocular Movements: Extraocular movements intact.  Cardiovascular:     Rate and Rhythm: Normal rate.     Heart sounds: Normal heart sounds. No murmur heard.   Pulmonary:     Effort: Pulmonary effort is normal. No respiratory distress.     Breath sounds: Normal breath sounds.  Abdominal:     General: Abdomen is flat.     Palpations: Abdomen is soft.     Tenderness: There is generalized abdominal tenderness. There is no guarding or rebound. Negative signs include Murphy's sign and McBurney's sign.  Neurological:     Mental Status: She is alert.       Lab Results  Component Value Date   WBC 15.4 (H) 10/20/2020   HGB 9.2 (L) 10/20/2020   HCT 28.2 (L) 10/20/2020   PLT 413 (H) 10/20/2020   GLUCOSE 121 (H) 10/20/2020   CHOL 141 07/05/2020   TRIG 127 07/05/2020   HDL 46 07/05/2020   LDLCALC 72 07/05/2020   ALT 11 10/17/2020   AST 14 (L) 10/17/2020   NA 140 10/20/2020   K 3.4 (L) 10/20/2020   CL 114 (H) 10/20/2020   CREATININE 0.78 10/20/2020   BUN 14 10/20/2020   CO2 20 (L) 10/20/2020   TSH 0.987 07/05/2020   INR 1.0 10/18/2020      Assessment & Plan:   Diagnoses and all orders for this visit: Generalized abdominal pain -     CT Angio Abd/Pel w/ and/or w/o Patient is having and has a history of cardiovascular disease with some guarding and no active BS.  She is  unable to eat only small amounts.  Patient had a stat CT with contrast of her abdomen and was found to have multiple fistulas present as well as diffuse adhesions from old surgery.  She needs to get this evaluated by a surgeon as soon as possible and probably will need lysis of adhesions.  Patient wants to go to [facility] emergency room to be evaluated for possible admission  Mesenteric ischemia [facility]) Patient has a history of cardiovascular disease and coronary artery disease.  She is only able to eat small amounts before the pain becomes too severe and she has had some vomiting and very little stool production.  I am concerned that she may have some mesenteric ischemia although not apparently not showing up on the CT scan.  Patient has mixed incontinence and requires disposable briefs to prevent deterioration of condition and to maintain current independence.  She has chronic moderate malnutrition albumin low 3.1, patient requires nutritional supplementation to maintain independence and to prevent further deterioration      I spent 30 minutes dedicated to the care of this patient on the date of this encounter to include face-to-face time with the patient, as well as:   Follow-up: Return in about 2 weeks (around 10/30/2020).  An After Visit  Summary was printed and given to the patient.  Reinaldo Meeker, MD [facility] [phone]

## 2020-10-17 ENCOUNTER — Encounter (HOSPITAL_COMMUNITY): Payer: Self-pay | Admitting: *Deleted

## 2020-10-17 ENCOUNTER — Other Ambulatory Visit: Payer: Self-pay

## 2020-10-17 ENCOUNTER — Telehealth: Payer: Medicare Other

## 2020-10-17 ENCOUNTER — Inpatient Hospital Stay (HOSPITAL_COMMUNITY)
Admission: EM | Admit: 2020-10-17 | Discharge: 2020-10-29 | DRG: 871 | Disposition: A | Discharge status: Home Health | Payer: 59 | Attending: Internal Medicine | Admitting: Internal Medicine

## 2020-10-17 DIAGNOSIS — E039 Hypothyroidism, unspecified: Secondary | ICD-10-CM | POA: Diagnosis present

## 2020-10-17 DIAGNOSIS — M25562 Pain in left knee: Secondary | ICD-10-CM | POA: Diagnosis present

## 2020-10-17 DIAGNOSIS — J449 Chronic obstructive pulmonary disease, unspecified: Secondary | ICD-10-CM | POA: Diagnosis present

## 2020-10-17 DIAGNOSIS — M4726 Other spondylosis with radiculopathy, lumbar region: Secondary | ICD-10-CM | POA: Diagnosis present

## 2020-10-17 DIAGNOSIS — R6521 Severe sepsis with septic shock: Secondary | ICD-10-CM | POA: Diagnosis present

## 2020-10-17 DIAGNOSIS — Z9861 Coronary angioplasty status: Secondary | ICD-10-CM

## 2020-10-17 DIAGNOSIS — E874 Mixed disorder of acid-base balance: Secondary | ICD-10-CM | POA: Diagnosis not present

## 2020-10-17 DIAGNOSIS — I420 Dilated cardiomyopathy: Secondary | ICD-10-CM | POA: Diagnosis present

## 2020-10-17 DIAGNOSIS — E878 Other disorders of electrolyte and fluid balance, not elsewhere classified: Secondary | ICD-10-CM | POA: Diagnosis not present

## 2020-10-17 DIAGNOSIS — N179 Acute kidney failure, unspecified: Secondary | ICD-10-CM | POA: Diagnosis present

## 2020-10-17 DIAGNOSIS — I110 Hypertensive heart disease with heart failure: Secondary | ICD-10-CM | POA: Diagnosis present

## 2020-10-17 DIAGNOSIS — R109 Unspecified abdominal pain: Secondary | ICD-10-CM

## 2020-10-17 DIAGNOSIS — G8929 Other chronic pain: Secondary | ICD-10-CM | POA: Diagnosis present

## 2020-10-17 DIAGNOSIS — I5022 Chronic systolic (congestive) heart failure: Secondary | ICD-10-CM | POA: Diagnosis present

## 2020-10-17 DIAGNOSIS — I2510 Atherosclerotic heart disease of native coronary artery without angina pectoris: Secondary | ICD-10-CM | POA: Diagnosis present

## 2020-10-17 DIAGNOSIS — F321 Major depressive disorder, single episode, moderate: Secondary | ICD-10-CM | POA: Diagnosis present

## 2020-10-17 DIAGNOSIS — M25561 Pain in right knee: Secondary | ICD-10-CM | POA: Diagnosis present

## 2020-10-17 DIAGNOSIS — K632 Fistula of intestine: Secondary | ICD-10-CM | POA: Diagnosis present

## 2020-10-17 DIAGNOSIS — K219 Gastro-esophageal reflux disease without esophagitis: Secondary | ICD-10-CM | POA: Diagnosis present

## 2020-10-17 DIAGNOSIS — R112 Nausea with vomiting, unspecified: Secondary | ICD-10-CM | POA: Diagnosis not present

## 2020-10-17 DIAGNOSIS — J9611 Chronic respiratory failure with hypoxia: Secondary | ICD-10-CM | POA: Diagnosis present

## 2020-10-17 DIAGNOSIS — D649 Anemia, unspecified: Secondary | ICD-10-CM | POA: Diagnosis present

## 2020-10-17 DIAGNOSIS — I9589 Other hypotension: Secondary | ICD-10-CM

## 2020-10-17 DIAGNOSIS — Z8249 Family history of ischemic heart disease and other diseases of the circulatory system: Secondary | ICD-10-CM

## 2020-10-17 DIAGNOSIS — A419 Sepsis, unspecified organism: Secondary | ICD-10-CM

## 2020-10-17 DIAGNOSIS — I959 Hypotension, unspecified: Secondary | ICD-10-CM | POA: Diagnosis present

## 2020-10-17 DIAGNOSIS — M1611 Unilateral primary osteoarthritis, right hip: Secondary | ICD-10-CM | POA: Diagnosis present

## 2020-10-17 DIAGNOSIS — E869 Volume depletion, unspecified: Secondary | ICD-10-CM | POA: Diagnosis present

## 2020-10-17 DIAGNOSIS — I10 Essential (primary) hypertension: Secondary | ICD-10-CM | POA: Diagnosis present

## 2020-10-17 DIAGNOSIS — Z87891 Personal history of nicotine dependence: Secondary | ICD-10-CM

## 2020-10-17 DIAGNOSIS — E785 Hyperlipidemia, unspecified: Secondary | ICD-10-CM | POA: Diagnosis present

## 2020-10-17 DIAGNOSIS — Z95810 Presence of automatic (implantable) cardiac defibrillator: Secondary | ICD-10-CM | POA: Diagnosis present

## 2020-10-17 DIAGNOSIS — K5792 Diverticulitis of intestine, part unspecified, without perforation or abscess without bleeding: Secondary | ICD-10-CM

## 2020-10-17 DIAGNOSIS — R6520 Severe sepsis without septic shock: Secondary | ICD-10-CM | POA: Diagnosis not present

## 2020-10-17 DIAGNOSIS — Z79891 Long term (current) use of opiate analgesic: Secondary | ICD-10-CM

## 2020-10-17 DIAGNOSIS — K648 Other hemorrhoids: Secondary | ICD-10-CM | POA: Diagnosis present

## 2020-10-17 DIAGNOSIS — F4310 Post-traumatic stress disorder, unspecified: Secondary | ICD-10-CM | POA: Diagnosis present

## 2020-10-17 DIAGNOSIS — E079 Disorder of thyroid, unspecified: Secondary | ICD-10-CM | POA: Diagnosis present

## 2020-10-17 DIAGNOSIS — M810 Age-related osteoporosis without current pathological fracture: Secondary | ICD-10-CM | POA: Diagnosis present

## 2020-10-17 DIAGNOSIS — Z9049 Acquired absence of other specified parts of digestive tract: Secondary | ICD-10-CM

## 2020-10-17 DIAGNOSIS — R1084 Generalized abdominal pain: Secondary | ICD-10-CM | POA: Diagnosis not present

## 2020-10-17 DIAGNOSIS — E861 Hypovolemia: Secondary | ICD-10-CM

## 2020-10-17 DIAGNOSIS — E782 Mixed hyperlipidemia: Secondary | ICD-10-CM | POA: Diagnosis present

## 2020-10-17 DIAGNOSIS — I5023 Acute on chronic systolic (congestive) heart failure: Secondary | ICD-10-CM | POA: Diagnosis present

## 2020-10-17 DIAGNOSIS — Z7982 Long term (current) use of aspirin: Secondary | ICD-10-CM

## 2020-10-17 DIAGNOSIS — M545 Low back pain, unspecified: Secondary | ICD-10-CM | POA: Diagnosis not present

## 2020-10-17 DIAGNOSIS — R110 Nausea: Secondary | ICD-10-CM

## 2020-10-17 DIAGNOSIS — G43009 Migraine without aura, not intractable, without status migrainosus: Secondary | ICD-10-CM | POA: Diagnosis present

## 2020-10-17 DIAGNOSIS — E876 Hypokalemia: Secondary | ICD-10-CM | POA: Diagnosis present

## 2020-10-17 DIAGNOSIS — M549 Dorsalgia, unspecified: Secondary | ICD-10-CM | POA: Diagnosis present

## 2020-10-17 DIAGNOSIS — Z7951 Long term (current) use of inhaled steroids: Secondary | ICD-10-CM

## 2020-10-17 DIAGNOSIS — I429 Cardiomyopathy, unspecified: Secondary | ICD-10-CM

## 2020-10-17 DIAGNOSIS — Z20822 Contact with and (suspected) exposure to covid-19: Secondary | ICD-10-CM | POA: Diagnosis present

## 2020-10-17 HISTORY — DX: Fistula of intestine: K63.2

## 2020-10-17 HISTORY — DX: Diverticulitis of intestine, part unspecified, without perforation or abscess without bleeding: K57.92

## 2020-10-17 HISTORY — DX: Sepsis, unspecified organism: A41.9

## 2020-10-17 HISTORY — DX: Unspecified abdominal pain: R10.9

## 2020-10-17 LAB — CBC WITH DIFFERENTIAL/PLATELET
Abs Immature Granulocytes: 0.12 10*3/uL — ABNORMAL HIGH (ref 0.00–0.07)
Basophils Absolute: 0.1 10*3/uL (ref 0.0–0.1)
Basophils Relative: 1 %
Eosinophils Absolute: 0.1 10*3/uL (ref 0.0–0.5)
Eosinophils Relative: 1 %
HCT: 31.7 % — ABNORMAL LOW (ref 36.0–46.0)
Hemoglobin: 10.4 g/dL — ABNORMAL LOW (ref 12.0–15.0)
Immature Granulocytes: 1 %
Lymphocytes Relative: 12 %
Lymphs Abs: 1.8 10*3/uL (ref 0.7–4.0)
MCH: 30.3 pg (ref 26.0–34.0)
MCHC: 32.8 g/dL (ref 30.0–36.0)
MCV: 92.4 fL (ref 80.0–100.0)
Monocytes Absolute: 1.1 10*3/uL — ABNORMAL HIGH (ref 0.1–1.0)
Monocytes Relative: 7 %
Neutro Abs: 12.1 10*3/uL — ABNORMAL HIGH (ref 1.7–7.7)
Neutrophils Relative %: 78 %
Platelets: 402 10*3/uL — ABNORMAL HIGH (ref 150–400)
RBC: 3.43 MIL/uL — ABNORMAL LOW (ref 3.87–5.11)
RDW: 12.1 % (ref 11.5–15.5)
WBC: 15.3 10*3/uL — ABNORMAL HIGH (ref 4.0–10.5)
nRBC: 0 % (ref 0.0–0.2)

## 2020-10-17 LAB — APTT: aPTT: 32 seconds (ref 24–36)

## 2020-10-17 LAB — COMPREHENSIVE METABOLIC PANEL
ALT: 11 U/L (ref 0–44)
AST: 14 U/L — ABNORMAL LOW (ref 15–41)
Albumin: 3.1 g/dL — ABNORMAL LOW (ref 3.5–5.0)
Alkaline Phosphatase: 74 U/L (ref 38–126)
Anion gap: 12 (ref 5–15)
BUN: 31 mg/dL — ABNORMAL HIGH (ref 8–23)
CO2: 22 mmol/L (ref 22–32)
Calcium: 8.8 mg/dL — ABNORMAL LOW (ref 8.9–10.3)
Chloride: 105 mmol/L (ref 98–111)
Creatinine, Ser: 1.47 mg/dL — ABNORMAL HIGH (ref 0.44–1.00)
GFR, Estimated: 39 mL/min — ABNORMAL LOW (ref >60)
Glucose, Bld: 113 mg/dL — ABNORMAL HIGH (ref 70–99)
Potassium: 3.8 mmol/L (ref 3.5–5.1)
Sodium: 139 mmol/L (ref 135–145)
Total Bilirubin: 0.7 mg/dL (ref 0.3–1.2)
Total Protein: 6.6 g/dL (ref 6.5–8.1)

## 2020-10-17 LAB — LACTIC ACID, PLASMA
Lactic Acid, Venous: 1.1 mmol/L (ref 0.5–1.9)
Lactic Acid, Venous: 1.3 mmol/L (ref 0.5–1.9)

## 2020-10-17 MED ORDER — LACTATED RINGERS IV BOLUS (SEPSIS)
1000.0000 mL | Freq: Once | INTRAVENOUS | Status: AC
  Administered 2020-10-17: 1000 mL via INTRAVENOUS

## 2020-10-17 MED ORDER — ROSUVASTATIN CALCIUM 20 MG PO TABS: 20.0000 mg | ORAL_TABLET | Freq: Every morning | ORAL | Status: DC

## 2020-10-17 MED ORDER — SODIUM CHLORIDE 0.9 % IV SOLN
2.0000 g | Freq: Two times a day (BID) | INTRAVENOUS | Status: DC
  Administered 2020-10-18 – 2020-10-23 (×11): 2 g via INTRAVENOUS
  Filled 2020-10-17 (×12): qty 2

## 2020-10-17 MED ORDER — OXYCODONE HCL 5 MG PO TABS: 10.0000 mg | ORAL_TABLET | ORAL | Status: DC | PRN

## 2020-10-17 MED ORDER — ONDANSETRON HCL 4 MG PO TABS
4.0000 mg | ORAL_TABLET | Freq: Four times a day (QID) | ORAL | Status: DC | PRN
  Administered 2020-10-18: 4 mg via ORAL
  Filled 2020-10-17 (×2): qty 1

## 2020-10-17 MED ORDER — SODIUM CHLORIDE 0.9 % IV SOLN: 250.0000 mL | INTRAVENOUS | Status: DC | PRN

## 2020-10-17 MED ORDER — CIPROFLOXACIN IN D5W 400 MG/200ML IV SOLN
400.0000 mg | Freq: Once | INTRAVENOUS | Status: AC
  Administered 2020-10-17: 400 mg via INTRAVENOUS
  Filled 2020-10-17: qty 200

## 2020-10-17 MED ORDER — OXYCODONE HCL 10 MG PO TABS: 10.0000 mg | ORAL_TABLET | ORAL | Status: DC | PRN

## 2020-10-17 MED ORDER — FENTANYL CITRATE (PF) 100 MCG/2ML IJ SOLN
25.0000 ug | Freq: Once | INTRAMUSCULAR | Status: AC
  Administered 2020-10-17: 25 ug via INTRAVENOUS
  Filled 2020-10-17: qty 2

## 2020-10-17 MED ORDER — NITROGLYCERIN 0.4 MG SL SUBL: 0.4000 mg | SUBLINGUAL_TABLET | SUBLINGUAL | Status: DC | PRN

## 2020-10-17 MED ORDER — BISACODYL 5 MG PO TBEC
5.0000 mg | DELAYED_RELEASE_TABLET | Freq: Every day | ORAL | Status: DC | PRN
Start: 2020-10-17 — End: 2020-10-17

## 2020-10-17 MED ORDER — SODIUM CHLORIDE 0.9 % IV BOLUS
1000.0000 mL | Freq: Once | INTRAVENOUS | Status: AC
  Administered 2020-10-17: 1000 mL via INTRAVENOUS

## 2020-10-17 MED ORDER — SODIUM CHLORIDE 0.9 % IV SOLN
2.0000 g | Freq: Once | INTRAVENOUS | Status: AC
  Administered 2020-10-17: 2 g via INTRAVENOUS
  Filled 2020-10-17: qty 2

## 2020-10-17 MED ORDER — SERTRALINE HCL 50 MG PO TABS: 50.0000 mg | ORAL_TABLET | Freq: Every day | ORAL | Status: DC

## 2020-10-17 MED ORDER — SODIUM CHLORIDE 0.9 % IV SOLN: INTRAVENOUS | Status: DC

## 2020-10-17 MED ORDER — CARVEDILOL 3.125 MG PO TABS: 3.1250 mg | ORAL_TABLET | Freq: Two times a day (BID) | ORAL | Status: DC

## 2020-10-17 MED ORDER — OXYCODONE HCL 5 MG PO TABS
5.0000 mg | ORAL_TABLET | ORAL | Status: DC | PRN
  Administered 2020-10-17 – 2020-10-21 (×5): 5 mg via ORAL
  Filled 2020-10-17 (×5): qty 1

## 2020-10-17 MED ORDER — CELECOXIB 200 MG PO CAPS
200.0000 mg | ORAL_CAPSULE | Freq: Every morning | ORAL | Status: DC
  Administered 2020-10-18 – 2020-10-29 (×11): 200 mg via ORAL
  Filled 2020-10-17 (×12): qty 1

## 2020-10-17 MED ORDER — METRONIDAZOLE IN NACL 5-0.79 MG/ML-% IV SOLN
500.0000 mg | Freq: Three times a day (TID) | INTRAVENOUS | Status: DC
  Administered 2020-10-18 – 2020-10-23 (×17): 500 mg via INTRAVENOUS
  Filled 2020-10-17 (×17): qty 100

## 2020-10-17 MED ORDER — DEXTROSE-NACL 5-0.45 % IV SOLN: INTRAVENOUS | Status: DC

## 2020-10-17 MED ORDER — HYDROMORPHONE HCL 1 MG/ML IJ SOLN
0.5000 mg | INTRAMUSCULAR | Status: DC | PRN
  Administered 2020-10-18 (×2): 1 mg via INTRAVENOUS
  Administered 2020-10-18: 0.5 mg via INTRAVENOUS
  Administered 2020-10-19 – 2020-10-26 (×22): 1 mg via INTRAVENOUS
  Administered 2020-10-26: 0.5 mg via INTRAVENOUS
  Administered 2020-10-26 – 2020-10-27 (×5): 1 mg via INTRAVENOUS
  Filled 2020-10-17 (×31): qty 1

## 2020-10-17 MED ORDER — NOREPINEPHRINE 4 MG/250ML-% IV SOLN
2.0000 ug/min | INTRAVENOUS | Status: DC
  Administered 2020-10-17 – 2020-10-18 (×2): 2 ug/min via INTRAVENOUS
  Filled 2020-10-17: qty 250

## 2020-10-17 MED ORDER — ACETAMINOPHEN 325 MG PO TABS
650.0000 mg | ORAL_TABLET | Freq: Four times a day (QID) | ORAL | Status: DC | PRN
  Administered 2020-10-28 – 2020-10-29 (×2): 650 mg via ORAL
  Filled 2020-10-17 (×3): qty 2

## 2020-10-17 MED ORDER — SODIUM CHLORIDE 0.9% FLUSH
3.0000 mL | Freq: Two times a day (BID) | INTRAVENOUS | Status: DC
  Administered 2020-10-17 – 2020-10-29 (×14): 3 mL via INTRAVENOUS

## 2020-10-17 MED ORDER — SODIUM CHLORIDE 0.9% FLUSH
3.0000 mL | Freq: Two times a day (BID) | INTRAVENOUS | Status: DC
  Administered 2020-10-17 – 2020-10-28 (×8): 3 mL via INTRAVENOUS

## 2020-10-17 MED ORDER — GABAPENTIN 400 MG PO CAPS: 800.0000 mg | ORAL_CAPSULE | Freq: Two times a day (BID) | ORAL | Status: DC | PRN

## 2020-10-17 MED ORDER — ACETAMINOPHEN 650 MG RE SUPP: 650.0000 mg | Freq: Four times a day (QID) | RECTAL | Status: DC | PRN

## 2020-10-17 MED ORDER — SODIUM CHLORIDE 0.9% FLUSH: 3.0000 mL | INTRAVENOUS | Status: DC | PRN

## 2020-10-17 MED ORDER — TOPIRAMATE 100 MG PO TABS: 100.0000 mg | ORAL_TABLET | Freq: Every day | ORAL | Status: DC

## 2020-10-17 MED ORDER — METRONIDAZOLE IN NACL 5-0.79 MG/ML-% IV SOLN
500.0000 mg | Freq: Once | INTRAVENOUS | Status: AC
  Administered 2020-10-17: 500 mg via INTRAVENOUS
  Filled 2020-10-17: qty 100

## 2020-10-17 MED ORDER — RANOLAZINE ER 500 MG PO TB12
500.0000 mg | ORAL_TABLET | Freq: Two times a day (BID) | ORAL | Status: DC
  Filled 2020-10-17: qty 1

## 2020-10-17 MED ORDER — FLUTICASONE FUROATE-VILANTEROL 100-25 MCG/INH IN AEPB: 1.0000 | INHALATION_SPRAY | Freq: Every day | RESPIRATORY_TRACT | Status: DC

## 2020-10-17 MED ORDER — SODIUM CHLORIDE 0.9 % IV SOLN: 250.0000 mL | INTRAVENOUS | Status: DC

## 2020-10-17 MED ORDER — HEPARIN SODIUM (PORCINE) 5000 UNIT/ML IJ SOLN
5000.0000 [IU] | Freq: Three times a day (TID) | INTRAMUSCULAR | Status: DC
  Administered 2020-10-18 – 2020-10-29 (×36): 5000 [IU] via SUBCUTANEOUS
  Filled 2020-10-17 (×33): qty 1

## 2020-10-17 MED ORDER — IPRATROPIUM BROMIDE 0.02 % IN SOLN: 0.5000 mg | Freq: Four times a day (QID) | RESPIRATORY_TRACT | Status: DC | PRN

## 2020-10-17 MED ORDER — ORAL CARE MOUTH RINSE
15.0000 mL | Freq: Two times a day (BID) | OROMUCOSAL | Status: DC
  Administered 2020-10-17 – 2020-10-29 (×14): 15 mL via OROMUCOSAL

## 2020-10-17 MED ORDER — CHLORHEXIDINE GLUCONATE CLOTH 2 % EX PADS
6.0000 | MEDICATED_PAD | Freq: Every day | CUTANEOUS | Status: DC
  Administered 2020-10-19 (×2): 6 via TOPICAL

## 2020-10-17 MED ORDER — IPRATROPIUM BROMIDE 0.02 % IN SOLN: 0.5000 mg | RESPIRATORY_TRACT | Status: DC | PRN

## 2020-10-17 MED ORDER — ONDANSETRON HCL 4 MG/2ML IJ SOLN
4.0000 mg | Freq: Four times a day (QID) | INTRAMUSCULAR | Status: DC | PRN
  Administered 2020-10-20 – 2020-10-29 (×19): 4 mg via INTRAVENOUS
  Filled 2020-10-17 (×19): qty 2

## 2020-10-17 MED ORDER — LACTATED RINGERS IV BOLUS (SEPSIS)
1000.0000 mL | Freq: Once | INTRAVENOUS | Status: DC
  Administered 2020-10-17: 1000 mL via INTRAVENOUS

## 2020-10-17 MED ORDER — FLUTICASONE FUROATE-VILANTEROL 100-25 MCG/INH IN AEPB
1.0000 | INHALATION_SPRAY | Freq: Every day | RESPIRATORY_TRACT | Status: DC
  Administered 2020-10-19 – 2020-10-29 (×11): 1 via RESPIRATORY_TRACT
  Filled 2020-10-17: qty 28

## 2020-10-17 MED ORDER — MAGNESIUM CITRATE PO SOLN: 1.0000 | Freq: Once | ORAL | Status: DC | PRN

## 2020-10-17 MED ORDER — SENNOSIDES-DOCUSATE SODIUM 8.6-50 MG PO TABS: 1.0000 | ORAL_TABLET | Freq: Every evening | ORAL | Status: DC | PRN

## 2020-10-17 MED ORDER — TRAZODONE HCL 50 MG PO TABS: 25.0000 mg | ORAL_TABLET | Freq: Every evening | ORAL | Status: DC | PRN

## 2020-10-17 MED ORDER — DIAZEPAM 5 MG PO TABS
5.0000 mg | ORAL_TABLET | Freq: Three times a day (TID) | ORAL | Status: DC | PRN
  Administered 2020-10-18 – 2020-10-24 (×4): 5 mg via ORAL
  Filled 2020-10-17 (×5): qty 1

## 2020-10-17 NOTE — Consult Note (Signed)
General Surgery [facility]  Reason for Consult: diverticulitis with colocolonic fistulae  Referring Physician: Dr. Roger Shelter, [facility]  [name] is an 65 y.o. female.  HPI: Patient is a 65 year old female admitted to the medical service with signs and symptoms of sepsis due to underlying acute diverticulitis with complications.  Patient has a complex past abdominal surgical history.  Patient had undergone laparotomy in 2011 in [place].  She apparently had colonic obstruction although she is uncertain as to the cause.  Patient later was seen by our practice, Dr. Neysa Bonito, for incisional hernia repair in 2016.  Over the past 3 weeks the patient has developed progressive abdominal pain and loss of appetite.  Today she underwent a CT scan in [place], [place].  She was found to have evidence of acute diverticulitis with colocolonic fistulae.  White blood cell count is elevated at 15,000.  Patient was seen and evaluated in our emergency department.  She is being admitted to the medical service.  She has had 2 episodes of hypotension in the emergency department and will likely be admitted to a stepdown bed or ICU bed for hemodynamic monitoring overnight.  Surgery is asked to evaluate for possible surgical intervention at some point either during this hospital stay or subsequently.  Past Medical History:  Diagnosis Date  . Admission for long-term opiate analgesic use 10/24/2019  . Arthritis of right hip 05/29/2016   Formatting of this note might be different from the original. Added automatically from request for surgery [phone]  . BMI 26.0-26.9,adult 03/29/2020  . Cardiomyopathy [facility])    Overview:  Ejection fraction 45% in 2015 Ejection fraction 30 to 35% in November 2018  . Chronic back pain   . Chronic hypoxemic respiratory failure ([place]) 10/24/2019  . Chronic narcotic use 03/23/2015  . Chronic pain of both knees 12/21/2018   Added automatically  from request for surgery [id number]  Formatting of this note might be different from the original. Added automatically from request for surgery [id number]  . Chronic systolic congestive heart failure, NYHA class 2 ([place]) 06/19/2017  . COPD (chronic obstructive pulmonary disease) ([place])   . Coronary artery disease involving native coronary artery of native heart without angina pectoris 05/31/2015   Overview:  Abnormal stress test in fall of 2016, cardiac catheterization showed normal coronaries.  . Cystitis 05/17/2020  . Dehydration 05/17/2020  . Diarrhea 05/17/2020  . Dilated cardiomyopathy ([place]) 06/19/2017  . 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 Dyslipidemia 05/31/2015  . Essential hypertension 05/31/2015  . GERD (gastroesophageal reflux disease)   . 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[place]) 07/13/2020  . Hypotension 05/17/2020  . ICD (implantable cardiac defibrillator) in place 2012  . 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 Ileus following gastrointestinal surgery ([place]) 03/26/2015  . Major depressive disorder, single episode, moderate ([place]) 10/24/2019  . Migraine without aura with status migrainosus 10/24/2019  . Nausea alone 04/19/2014  . Obstructive chronic bronchitis with exacerbation ([place]) 10/25/2019  . Other spondylosis with radiculopathy, lumbar region 10/24/2019  . Persistent vomiting 05/17/2020  . Presence of left artificial hip joint 10/24/2019  . PTSD (post-traumatic stress disorder)   . 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 Senile osteoporosis 10/24/2019  . Thyroid disease     Past Surgical History:  Procedure Laterality Date  . APPENDECTOMY    . CARDIAC CATHETERIZATION    . CARPAL TUNNEL RELEASE    .  CESAREAN SECTION    . CHOLECYSTECTOMY    . CORONARY ANGIOPLASTY    . HAND SURGERY    . ICD GENERATOR CHANGEOUT N/A 05/21/2019   Procedure: ICD GENERATOR  CHANGEOUT;  Surgeon: Constance Haw, MD;  Location: [place] CV LAB;  Service: Cardiovascular;  Laterality: N/A;  . ICD IMPLANT     Medtronic  . intestinal blockage 2011    . LAPAROSCOPIC ASSISTED VENTRAL HERNIA REPAIR N/A 03/23/2015   Procedure: LAPAROSCOPIC VENTRAL WALL HERNIA REPAIR;  Surgeon: Michael Boston, MD;  [facility];  Service: General;  Laterality: N/A;  With MESH  . LAPAROSCOPIC LYSIS OF ADHESIONS N/A 03/23/2015   Procedure: LAPAROSCOPIC LYSIS OF ADHESIONS;  Surgeon: Michael Boston, MD;  Location[facility];  Service: General;  Laterality: N/A;  . NECK SURGERY     fused  . TONSILLECTOMY    . ULNAR NERVE TRANSPOSITION  01/23/2012   Procedure: ULNAR NERVE DECOMPRESSION/TRANSPOSITION;  Surgeon: Ophelia Charter, MD;  Location: [place] NEUR[facility] ORS;  Service: Neurosurgery;  Laterality: Left;  LEFT ulnar nerve decompression    Family History  Problem Relation Age of Onset  . Heart attack Other   . Cancer Other   . Heart failure Other   . Hypertension Mother   . Heart attack Mother   . Alcohol abuse Father   . Hypertension Father   . Heart attack Father   . Anesthesia problems Neg Hx   . Hypotension Neg Hx   . Malignant hyperthermia Neg Hx   . Pseudochol deficiency Neg Hx     Social History:  reports that she quit smoking about 6 months ago. Her smoking use included cigarettes. She has a 15.00 pack-year smoking history. She has never used smokeless tobacco. She reports current alcohol use of about 2.0 standard drinks of alcohol per week. She reports that she does not use drugs.  Allergies: No Known Allergies  Medications: I have reviewed the patient's current medications.  Results for orders placed or performed during the hospital encounter of 10/17/20 (from the past 48 hour(s))  Comprehensive metabolic panel     Status: Abnormal   Collection Time: 10/17/20  1:22 PM  Result Value Ref Range   Sodium 139 135 - 145 mmol/L   Potassium 3.8 3.5 - 5.1 mmol/L   Chloride 105 98  - 111 mmol/L   CO2 22 22 - 32 mmol/L   Glucose, Bld 113 (H) 70 - 99 mg/dL    Comment: Glucose reference range applies only to samples taken after fasting for at least 8 hours.   BUN 31 (H) 8 - 23 mg/dL   Creatinine, Ser 1.47 (H) 0.44 - 1.00 mg/dL   Calcium 8.8 (L) 8.9 - 10.3 mg/dL   Total Protein 6.6 6.5 - 8.1 g/dL   Albumin 3.1 (L) 3.5 - 5.0 g/dL   AST 14 (L) 15 - 41 U/L   ALT 11 0 - 44 U/L   Alkaline Phosphatase 74 38 - 126 U/L   Total Bilirubin 0.7 0.3 - 1.2 mg/dL   GFR, Estimated 39 (L) >60 mL/min    Comment: (NOTE) Calculated using the CKD-EPI Creatinine Equation (2021)    Anion gap 12 5 - 15    Comment: Performed at [facility], [place] [address], [place], [place] [address]  CBC with Differential     Status: Abnormal   Collection Time: 10/17/20  1:22 PM  Result Value Ref Range   WBC 15.3 (H) 4.0 - 10.5 K/uL   RBC  3.43 (L) 3.87 - 5.11 MIL/uL   Hemoglobin 10.4 (L) 12.0 - 15.0 g/dL   HCT 31.7 (L) 36.0 - 46.0 %   MCV 92.4 80.0 - 100.0 fL   MCH 30.3 26.0 - 34.0 pg   MCHC 32.8 30.0 - 36.0 g/dL   RDW 12.1 11.5 - 15.5 %   Platelets 402 (H) 150 - 400 K/uL   nRBC 0.0 0.0 - 0.2 %   Neutrophils Relative % 78 %   Neutro Abs 12.1 (H) 1.7 - 7.7 K/uL   Lymphocytes Relative 12 %   Lymphs Abs 1.8 0.7 - 4.0 K/uL   Monocytes Relative 7 %   Monocytes Absolute 1.1 (H) 0.1 - 1.0 K/uL   Eosinophils Relative 1 %   Eosinophils Absolute 0.1 0.0 - 0.5 K/uL   Basophils Relative 1 %   Basophils Absolute 0.1 0.0 - 0.1 K/uL   Immature Granulocytes 1 %   Abs Immature Granulocytes 0.12 (H) 0.00 - 0.07 K/uL    Comment: Performed at [facility], [place] [address], [place], [place] [address]  Lactic acid, plasma     Status: None   Collection Time: 10/17/20  1:22 PM  Result Value Ref Range   Lactic Acid, Venous 1.1 0.5 - 1.9 mmol/L    Comment: Performed at [facility], [place] [address], [place], [place] [address]  Culture, blood (routine x 2)      Status: None (Preliminary result)   Collection Time: 10/17/20  1:22 PM   Specimen: BLOOD  Result Value Ref Range   Specimen Description      BLOOD SITE NOT SPECIFIED Performed at [facility], [place] [address], [place], [place] [address]    Special Requests      BOTTLES DRAWN AEROBIC AND ANAEROBIC Blood Culture results may not be optimal due to an inadequate volume of blood received in culture bottles Performed at [facility], [place] [address], [place], [place] [address]    Culture PENDING    Report Status PENDING     No results found.  Review of Systems  Constitutional: Positive for appetite change and diaphoresis.  HENT: Negative.   Eyes: Negative.   Respiratory: Negative.   Cardiovascular: Negative.   Gastrointestinal: Positive for abdominal pain and nausea.  Endocrine: Negative.   Genitourinary: Negative.   Musculoskeletal: Positive for back pain and neck pain.  Skin: Negative.   Allergic/Immunologic: Negative.   Neurological: Negative.   Hematological: Negative.   Psychiatric/Behavioral: Positive for dysphoric mood.    Physical Exam  Blood pressure (!) 74/51, pulse 75, temperature 97.7 F (36.5 C), temperature source Oral, resp. rate 16, height 5' (1.524 m), weight 58.1 kg, SpO2 100 %.  CONSTITUTIONAL: no acute distress; conversant; no obvious deformities  EYES: conjunctiva moist; no lid lag; anicteric; pupils equal bilaterally  NECK: trachea midline; no thyroid nodularity  LUNGS: respiratory effort normal & unlabored; no wheeze; no rales; no tactile fremitus  CV: rate and rhythm regular; no palpable thrills; no murmur; no edema bilat lower extremities  GI: abdomen is soft with mild distention; there is tenderness and guarding in the left mid abdomen and left lower quadrant; no hepatosplenomegaly; no obvious hernia; well-healed lower abdominal midline surgical scars.  MSK: normal range of motion of extremities; no clubbing; no  cyanosis  PSYCH: appropriate affect for situation; alert and oriented to person, place, & time  LYMPHATIC: no palpable cervical lymphadenopathy; no evidence lymphedema in extremities   Assessment/Plan: Acute diverticulitis with complications,  possible colocolonic fistulae Sepsis syndrome with hypotension  Patient will be admitted to the medical service for initial management and resuscitation.  Given her episodes of hypotension in the emergency department, she will likely go to the stepdown unit or to the ICU for hemodynamic monitoring.  Patient has been started on intravenous fluids and intravenous antibiotics.  Patient will be discussed with our colorectal surgery specialist, and potentially seen by Dr. Neysa Bonito who has managed her in the past.  At this point she does not require acute operative intervention.  Surgery will follow closely.  Armandina Gemma, MD [facility] Office: [place] 10/17/2020, 7:48 PM

## 2020-10-17 NOTE — ED Provider Notes (Addendum)
Signout note  65 year old lady presenting to ER with concern for lower abdominal pain and abnormal CT scan, diverticulitits with colo-colonic fistula.  Soft BP but patient not toxic appearing, lower suspicion for sepsis. Started on IV abx and provided fluids.   Labs: borderline AKI, leukocytosis  CT on 2/21: 1. Redemonstrated abnormal configuration of the distal descending colon and proximal sigmoid, which appears to involve multiple colo-colonic fistulas and adhesion of adjacent loops. There is wall thickening and adjacent fat stranding in this vicinity, consistent with acute diverticulitis or other infectious or inflammatory colitis. There are occasional diverticula of this and other portions of the descending and sigmoid colon. 2. Status post cholecystectomy and appendectomy.   5:20 PM reassessed pt, BP modestly improved, now 53P systolic pt appears well, nontoxic; will give additional liter fluid bolus  5:30 PM I reviewed case, CT read with Dr. Harlow Asa, his team will see as a consult, request hospitalist admission, IV antibiotics, does not anticipate emergent surgery  Discussed case with Dr. Marylyn Ishihara, request this is paged out again  5:55 PM discussed case with hospitalist, will accept, given her soft blood pressures, will admit to SDU  7:17 PM  D/w Seyed; her BP has dropped again, he has placed orders for broader abx and additional 2L fluid bolus, activated code sepsis.  Reassessed patient, BP 94F systolic, she continues to [facility] well.  Given her history of heart failure (EF 35-40%), cautious about providing excessive fluids.  Will bolus only 1 extra liter for a total of 3 L for now.  Discussed case with Dr. Eartha Inch with critical care who will evaluate and admit patient. He recommends starting low dose peripheral pressors at this time.     Lucrezia Starch, MD 10/17/20 [phone]

## 2020-10-17 NOTE — Consult Note (Signed)
NAME:  [name], MRN:  [id number], DOB:  [birth year], LOS: 0 ADMISSION DATE:  10/17/2020, CONSULTATION DATE:  10/17/20 REFERRING MD:  EDP, CHIEF COMPLAINT:  Abdominal pain   Brief History:  65 y.o. F with PMH significant for HFrEF 35%, COPD, CAD, Cardiomyopathy with ICD, depression and recurrent incisional hernias s/p repair, hypothyroidism who presented with 3 weeks of lower abdominal pain and CT showing colo-colonic fistulas.  She remained hypotensive after IVF, so PCCM consulted  History of Present Illness:  [name] is a 65 y.o. F with PMH significant for HFrEF 35%, COPD, CAD, Cardiomyopathy with ICD, depression and recurrent incisional hernias s/p repair, hypothyroidism who presented with 3 weeks of lower abdominal pain and outpatient CT showing colo-colonic fistulas.    In the ED, she was hypotensive on arrival without significant tachycardia,  no reported fever,  diarrhea or vomiting.  Lab values were significant for WBC 15k, creatinine 1.4, lactic acid normal at 1.1.  General surgery reviewed images and recommended medical management with out emergent operative intervention.   She was given 3L IVF, Flagyl, Cefepime and Cipro and BP transiently improved, however she became hypotensive again, so PCCM consulted for pressors and admission.  Past Medical History:   has a past medical history of Admission for long-term opiate analgesic use (10/24/2019), Arthritis of right hip (05/29/2016), BMI 26.0-26.9,adult (03/29/2020), Cardiomyopathy ([place]), Chronic back pain, Chronic hypoxemic respiratory failure ([place]) (10/24/2019), Chronic narcotic use (03/23/2015), Chronic pain of both knees (0/25/8527), Chronic systolic congestive heart failure, NYHA class 2 ([place]) (06/19/2017), COPD (chronic obstructive pulmonary disease) ([place]), Coronary artery disease involving native coronary artery of native heart without angina pectoris (05/31/2015), Cystitis (05/17/2020), Dehydration (05/17/2020), Diarrhea (05/17/2020),  Dilated cardiomyopathy ([place]) (06/19/2017), 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), 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 (06/19/2017), Dyslipidemia (05/31/2015), Essential hypertension (05/31/2015), GERD (gastroesophageal reflux disease), 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), Hypotension (05/17/2020), ICD (implantable cardiac defibrillator) in place (2012), ICD (implantable cardioverter-defibrillator) in place (06/19/2017), Ileus following gastrointestinal surgery ([place]) (03/26/2015), Major depressive disorder, single episode, moderate ([place]) (10/24/2019), Migraine without aura with status migrainosus (10/24/2019), Nausea alone (04/19/2014), Obstructive chronic bronchitis with exacerbation ([place]) (10/25/2019), Other spondylosis with radiculopathy, lumbar region (10/24/2019), Persistent vomiting (05/17/2020), Presence of left artificial hip joint (10/24/2019), PTSD (post-traumatic stress disorder), 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   Significant Hospital Events:  2/22 Admit to PCCM  Consults:  General Surgery  Procedures:    Significant Diagnostic Tests:   2/22 CT abd/pelvis>> 1. Redemonstrated abnormal configuration of the distal descending colon and proximal sigmoid, which appears to involve multiple colo-colonic fistulas and adhesion of adjacent loops. There is wall thickening and adjacent fat stranding in this vicinity, consistent with acute diverticulitis or other infectious or inflammatory colitis. There are occasional diverticula of this and other portions of the descending and sigmoid colon. 2. Status post cholecystectomy and appendectomy.  Micro Data:  2/22 BCx2>> 2/22 Covid-19>>pending   Antimicrobials:   Flagyl 2/22- Cipro 2/22- Cefepime 2/22-  Interim History / Subjective:  As above   Objective   Blood pressure (!) 74/51, pulse 75, temperature 97.7 F  (36.5 C), temperature source Oral, resp. rate 16, height 5' (1.524 m), weight 58.1 kg, SpO2 100 %.        Intake/Output Summary (Last 24 hours) at 10/17/2020 1916 Last data filed at 10/17/2020 1845 Gross per 24 hour  Intake 2817.76 ml  Output -  Net 2817.76 ml   [employer]   10/17/20  1050  Weight: 58.1 kg    Examination: General: Elderly woman, supine in bed, no distress HENT: Mild pallor, no icterus  Lungs: Clear breath sounds bilateral, no accessory muscle use Cardiovascular: S1-S2 regular, no murmurs Abdomen: Soft, mild tenderness to left lower quadrants, no guarding, hypoactive bowel sounds Extremities: No edema, no deformity Neuro: Alert, interactive, nonfocal   Resolved Hospital Problem list     Assessment & Plan:  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 Empiric broad-spectrum antibiotics-cefepime and Flagyl   Cardiomyopathy-EF 35% -Hold meds due to hypotension -decrease IVFs to 75/h  AKI - baseline cr 1.1 range -follow BMET  Acute on chronic pain, chronic opiate use -Oxycodone will be held while n.p.o., use dilaudid as needed - watch for BDZ withdrawal   Best practice (evaluated daily)  Diet: NPO Pain/Anxiety/Delirium protocol (if indicated): dilaudid as needed VAP protocol (if indicated): N/A DVT prophylaxis: Lovenox GI prophylaxis: Protonix Glucose control: SSI Mobility: Bedrest Disposition: ICU  Goals of Care:   Code Status: full  Labs   CBC: Recent Labs  Lab 10/17/20 1322  WBC 15.3*  NEUTROABS 12.1*  HGB 10.4*  HCT 31.7*  MCV 92.4  PLT 402*    Basic Metabolic Panel: Recent Labs   Lab 10/17/20 1322  NA 139  K 3.8  CL 105  CO2 22  GLUCOSE 113*  BUN 31*  CREATININE 1.47*  CALCIUM 8.8*   GFR: Estimated Creatinine Clearance: 30 mL/min (A) (by C-G formula based on SCr of 1.47 mg/dL (H)). Recent Labs  Lab 10/17/20 1322  WBC 15.3*  LATICACIDVEN 1.1    Liver Function Tests: Recent Labs  Lab 10/17/20 1322  AST 14*  ALT 11  ALKPHOS 74  BILITOT 0.7  PROT 6.6  ALBUMIN 3.1*   No results for input(s): LIPASE, AMYLASE in the last 168 hours. No results for input(s): AMMONIA in the last 168 hours.  ABG    Component Value Date/Time   PHART 7.396 04/04/2009 1009   PCO2ART 39.9 04/04/2009 1009   PO2ART 69.0 (L) 04/04/2009 1009   HCO3 24.6 (H) 04/04/2009 1012   TCO2 26 04/04/2009 1012   ACIDBASEDEF 1.0 04/04/2009 1012   O2SAT 72.0 04/04/2009 1012     Coagulation Profile: No results for input(s): INR, PROTIME in the last 168 hours.  Cardiac Enzymes: No results for input(s): CKTOTAL, CKMB, CKMBINDEX, TROPONINI in the last 168 hours.  HbA1C: No results found for: HGBA1C  CBG: No results for input(s): GLUCAP in the last 168 hours.  Review of Systems:   Lower abdominal pain for 3 weeks Nausea, vomiting Loose stools +  Review of Systems  Constitutional: Negative.   HENT: Negative.   Respiratory: Negative.   Cardiovascular: Negative.   Genitourinary: Negative.   Musculoskeletal: Negative.   Skin: Negative.   Neurological: Negative.   Endo/Heme/Allergies: Negative.       Past Medical History:  She,  has a past medical history of Admission for long-term opiate analgesic use (10/24/2019), Arthritis of right hip (05/29/2016), BMI 26.0-26.9,adult (03/29/2020), Cardiomyopathy ([place]), Chronic back pain, Chronic hypoxemic respiratory failure ([place]) (10/24/2019), Chronic narcotic use (03/23/2015), Chronic pain of both knees (7/84/6962), Chronic systolic congestive heart failure, NYHA class 2 ([place]) (06/19/2017), COPD (chronic  obstructive pulmonary disease)  ([place]), Coronary artery disease involving native coronary artery of native heart without angina pectoris (05/31/2015), Cystitis (05/17/2020), Dehydration (05/17/2020), Diarrhea (05/17/2020), Dilated cardiomyopathy ([place]) (06/19/2017), 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), 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 (06/19/2017), Dyslipidemia (05/31/2015), Essential hypertension (05/31/2015), GERD (gastroesophageal reflux disease), 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), Hypotension (05/17/2020), ICD (implantable cardiac defibrillator) in place (2012), ICD (implantable cardioverter-defibrillator) in place (06/19/2017), Ileus following gastrointestinal surgery ([place]) (03/26/2015), Major depressive disorder, single episode, moderate ([place]) (10/24/2019), Migraine without aura with status migrainosus (10/24/2019), Nausea alone (04/19/2014), Obstructive chronic bronchitis with exacerbation ([place]) (10/25/2019), Other spondylosis with radiculopathy, lumbar region (10/24/2019), Persistent vomiting (05/17/2020), Presence of left artificial hip joint (10/24/2019), PTSD (post-traumatic stress disorder), 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   Surgical History:   Past Surgical History:  Procedure Laterality Date  . APPENDECTOMY    . CARDIAC CATHETERIZATION    . CARPAL TUNNEL RELEASE    . CESAREAN SECTION    . CHOLECYSTECTOMY    . CORONARY ANGIOPLASTY    . HAND SURGERY    . ICD GENERATOR CHANGEOUT N/A 05/21/2019   Procedure: ICD GENERATOR CHANGEOUT;  Surgeon: Constance Haw, MD;  Location: [facility];  Service: Cardiovascular;  Laterality: N/A;  . ICD IMPLANT     [employer]  . intestinal blockage 2011    . LAPAROSCOPIC ASSISTED VENTRAL HERNIA REPAIR N/A 03/23/2015   Procedure: LAPAROSCOPIC VENTRAL WALL HERNIA REPAIR;  Surgeon: Michael Boston, MD;  Location: [facility];  Service: General;  Laterality: N/A;   With MESH  . LAPAROSCOPIC LYSIS OF ADHESIONS N/A 03/23/2015   Procedure: LAPAROSCOPIC LYSIS OF ADHESIONS;  Surgeon: Michael Boston, MD;  Location: [facility];  Service: General;  Laterality: N/A;  . NECK SURGERY     fused  . TONSILLECTOMY    . ULNAR NERVE TRANSPOSITION  01/23/2012   Procedure: ULNAR NERVE DECOMPRESSION/TRANSPOSITION;  Surgeon: Ophelia Charter, MD;  Location: [facility];  Service: Neurosurgery;  Laterality: Left;  LEFT ulnar nerve decompression     Social History:   reports that she quit smoking about 6 months ago. Her smoking use included cigarettes. She has a 15.00 pack-year smoking history. She has never used smokeless tobacco. She reports current alcohol use of about 2.0 standard drinks of alcohol per week. She reports that she does not use drugs.   Family History:  Her family history includes Alcohol abuse in her father; Cancer in an other family member; Heart attack in her father, mother, and another family member; Heart failure in an other family member; Hypertension in her father and mother. There is no history of Anesthesia problems, Hypotension, Malignant hyperthermia, or Pseudochol deficiency.   Allergies No Known Allergies   Home Medications  Prior to Admission medications   Medication Sig Start Date End Date Taking? Authorizing Provider  aspirin 81 MG tablet Take 81 mg by mouth daily.   Yes [provider]  Buprenorphine HCl (BELBUCA) 900 MCG FILM Place 900 mcg inside cheek every 12 (twelve) hours.   Yes [provider]  carvedilol (COREG) 3.125 MG tablet TAKE 1 TABLET BY MOUTH TWICE DAILY 10/04/20  Yes Lillard Anes, MD  celecoxib (CELEBREX) 200 MG capsule TAKE ONE CAPSULE BY MOUTH EVERY MORNING 06/15/20  Yes Lillard Anes, MD  cyclobenzaprine (FLEXERIL) 10 MG tablet Take 10 mg by mouth 3 (three) times daily as needed for muscle spasms.   Yes [provider]  diazepam (VALIUM)  5 MG tablet Take 1 tablet (5 mg total) by  mouth 3 (three) times daily as needed. Patient taking differently: Take 5 mg by mouth 3 (three) times daily as needed for anxiety. 07/27/20  Yes Lillard Anes, MD  ENTRESTO 24-26 MG TAKE ONE TABLET BY MOUTH EVERY MORNING and TAKE ONE TABLET BY MOUTH EVERY EVENING 06/15/20  Yes Lillard Anes, MD  esomeprazole (NEXIUM) 20 MG capsule Take 1 capsule (20 mg total) by mouth daily. 10/02/20  Yes Lillard Anes, MD  fludrocortisone (FLORINEF) 0.1 MG tablet TAKE 1 TABLET BY MOUTH AS DIRECTED. Patient taking differently: Take 0.1 mg by mouth daily. 10/12/20  Yes Lillard Anes, MD  fluticasone furoate-vilanterol (BREO ELLIPTA) 100-25 MCG/INH AEPB Inhale 1 puff into the lungs daily in the afternoon. 10/03/20  Yes Lillard Anes, MD  Oxycodone HCl 10 MG TABS Take 1 tablet by mouth every 4 (four) hours as needed (pain).   Yes [provider]  ranolazine (RANEXA) 500 MG 12 hr tablet TAKE 1 TABLET(500 MG) BY MOUTH TWICE DAILY 10/04/20  Yes Park Liter, MD  rosuvastatin (CRESTOR) 20 MG tablet TAKE ONE TABLET BY MOUTH EVERY MORNING 09/27/20  Yes Lillard Anes, MD  sertraline (ZOLOFT) 100 MG tablet Take 0.5 tablets (50 mg total) by mouth daily. 08/11/20  Yes Lillard Anes, MD  tiZANidine (ZANAFLEX) 4 MG tablet Take 4 mg by mouth at bedtime.   Yes [provider]  topiramate (TOPAMAX) 50 MG tablet Take 2 tablets (100 mg total) by mouth at bedtime. 01/21/20  Yes Lillard Anes, MD  Ubrogepant (UBRELVY) 100 MG TABS Take 1 tablet by mouth as needed (May repeat 1 tablet after 2 hours if needed.  Maximum 2 tablets in 24 hours). Patient taking differently: Take 1 tablet by mouth every 2 (two) hours as needed (May repeat 1 tablet after 2 hours if needed.  Maximum 2 tablets in 24 hours). 01/21/20  Yes Lillard Anes, MD  denosumab [facility]) 60 MG/ML SOSY injection Inject 60 mg into the skin every 6 (six) months. 02/14/20   Lillard Anes, MD  diphenoxylate-atropine (LOMOTIL) 2.5-0.025 MG tablet Take 1 tablet by mouth 4 (four) times daily as needed for diarrhea or loose stools. 05/19/20   Lillard Anes, MD  EPINEPHRINE 0.3 mg/0.3 mL IJ SOAJ injection Inject 0.3 mLs (0.3 mg total) into the muscle as needed for anaphylaxis. 04/27/20   Lillard Anes, MD  fluticasone [facility]) 50 MCG/ACT nasal spray Place 2 sprays into both nostrils daily as needed (congestion). 01/21/20   Lillard Anes, MD  furosemide (LASIX) 40 MG tablet TAKE ONE TABLET BY MOUTH EVERY MORNING MAY take 0.5 tablet in THE afternoon IF needed Patient taking differently: Take 20 mg by mouth daily as needed for fluid. 06/15/20   Lillard Anes, MD  gabapentin (NEURONTIN) 800 MG tablet Take 1 tablet (800 mg total) by mouth 3 (three) times daily. Patient usually takes twice a day Patient taking differently: Take 800 mg by mouth 2 (two) times daily as needed (pain). 01/21/20   Lillard Anes, MD  ipratropium (ATROVENT) 0.02 % nebulizer solution Take 2.5 mLs (0.5 mg total) by nebulization every 4 (four) hours as needed for wheezing or shortness of breath. 01/21/20   Lillard Anes, MD  levocetirizine (XYZAL) 5 MG tablet Take 5 mg by mouth daily as needed for allergies. 12/13/19   [provider]  LINZESS 145 MCG CAPS capsule Take 145 mcg by  mouth daily as needed (indigestion). 08/22/20   [provider]  nitroGLYCERIN (NITROSTAT) 0.4 MG SL tablet Place 1 tablet (0.4 mg total) under the tongue every 5 (five) minutes as needed for chest pain. 01/21/20   Lillard Anes, MD  ondansetron (ZOFRAN) 4 MG tablet Take 1 tablet (4 mg total) by mouth every 8 (eight) hours as needed for nausea or vomiting. 05/17/20   Lillard Anes, MD       Kara Mead MD. FCCP.  Pulmonary & Critical care Pager : [phone]  If no response to pager , please call [phone] until 7 pm After 7:00 pm call Elink   [phone]   10/17/2020

## 2020-10-17 NOTE — Progress Notes (Signed)
PHARMACY NOTE -  Cefepime  Pharmacy has been assisting with dosing of cefepime for IAI.  Dosage initiated at 2g IV q12 hr and further renal adjustments per institutional Pharmacy antibiotic protocol   Pharmacy will sign off, following peripherally for culture results or dose adjustments. Please reconsult if a change in clinical status warrants re-evaluation of dosage.  Reuel Boom, PharmD, BCPS [phone] 10/17/2020, 6:51 PM

## 2020-10-17 NOTE — Sepsis Progress Note (Signed)
Sepsis protocol is being monitored by eLink. 

## 2020-10-17 NOTE — ED Triage Notes (Signed)
Pt saw PCP yesterday CT done. "appears to involve multiple colo-colonic fistulas and adhesion of adjacent loops" Pain for 3 weeks

## 2020-10-17 NOTE — ED Notes (Signed)
Patient can keep food and drink down

## 2020-10-17 NOTE — Plan of Care (Signed)

## 2020-10-17 NOTE — Sepsis Progress Note (Signed)
eLink is monitoring this Code Sepsis. °

## 2020-10-17 NOTE — ED Notes (Signed)
ED TO INPATIENT HANDOFF REPORT  Name/Age/Gender [name] 65 y.o. female  Code Status    Code Status Orders  (From admission, onward)         Start     Ordered   10/17/20 1757  Full code  Continuous        10/17/20 1759        Code Status History    Date Active Date Inactive Code Status Order ID Comments User Context   05/21/2019 1345 05/21/2019 1756 Full Code [id number]  Constance Haw, MD Inpatient   03/26/2015 1615 03/30/2015 1646 Full Code [id number]  Alphonsa Overall, MD Inpatient   03/23/2015 1754 03/24/2015 1807 Full Code [id number]  Michael Boston, MD Inpatient   Advance Care Planning Activity      Home/SNF/Other Home  Chief Complaint Abdominal pain [R10.9]  Level of Care/Admitting Diagnosis ED Disposition    ED Disposition Condition [place]: [place] [100102]  Level of Care: Progressive [102]  Admit to Progressive based on following criteria: GI, ENDOCRINE disease patients with GI bleeding, acute liver failure or pancreatitis, stable with diabetic ketoacidosis or thyrotoxicosis (hypothyroid) state.  May admit patient to [facility] or [facility] if equivalent level of care is available:: Yes  Covid Evaluation: Confirmed COVID Negative  Diagnosis: Abdominal pain [643329]  Admitting Physician: Deatra James [[id number]]  Attending Physician: Deatra James [phone]  Estimated length of stay: 3 - 4 days  Certification:: I certify this patient will need inpatient services for at least 2 midnights       Medical History Past Medical History:  Diagnosis Date  . Admission for long-term opiate analgesic use 10/24/2019  . Arthritis of right hip 05/29/2016   Formatting of this note might be different from the original. Added automatically from request for surgery [id number]  . BMI 26.0-26.9,adult 03/29/2020  . Cardiomyopathy [facility])    Overview:  Ejection fraction 45% in 2015 Ejection fraction 30 to 35% in November 2018   . Chronic back pain   . Chronic hypoxemic respiratory failure ([place]) 10/24/2019  . Chronic narcotic use 03/23/2015  . Chronic pain of both knees 12/21/2018   Added automatically from request for surgery [id number]  Formatting of this note might be different from the original. Added automatically from request for surgery [phone]  . Chronic systolic congestive heart failure, NYHA class 2 ([place]) 06/19/2017  . COPD (chronic obstructive pulmonary disease) ([place])   . Coronary artery disease involving native coronary artery of native heart without angina pectoris 05/31/2015   Overview:  Abnormal stress test in fall of 2016, cardiac catheterization showed normal coronaries.  . Cystitis 05/17/2020  . Dehydration 05/17/2020  . Diarrhea 05/17/2020  . Dilated cardiomyopathy ([place]) 06/19/2017  . 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 Dyslipidemia 05/31/2015  . Essential hypertension 05/31/2015  . GERD (gastroesophageal reflux disease)   . 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[place]) 07/13/2020  . Hypotension 05/17/2020  . ICD (implantable cardiac defibrillator) in place 2012  . 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 Ileus following gastrointestinal surgery ([place]) 03/26/2015  . Major depressive disorder, single episode, moderate ([place]) 10/24/2019  . Migraine without aura with status migrainosus 10/24/2019  . Nausea alone 04/19/2014  . Obstructive chronic bronchitis with exacerbation ([place]) 10/25/2019  . Other spondylosis with radiculopathy, lumbar region 10/24/2019  . Persistent vomiting 05/17/2020  . Presence of left artificial hip joint 10/24/2019  . PTSD (post-traumatic stress disorder)   .  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 Senile osteoporosis 10/24/2019  . Thyroid disease     Allergies No Known Allergies  IV Location/Drains/Wounds Patient Lines/Drains/Airways Status    Active  Line/Drains/Airways    Name Placement date Placement time Site Days   Peripheral IV 10/17/20 Left Hand 10/17/20  1310  Hand  less than 1   Peripheral IV 10/17/20 Right Forearm 10/17/20  1315  Forearm  less than 1   Incision 01/23/12 Hand Left 01/23/12  1900  -- 3190   Incision (Closed) 03/23/15 Abdomen 03/23/15  1508  -- 2035   Incision - 3 Ports Abdomen Left;Lower;Lateral Left;Medial;Lateral Left;Lateral;Upper 03/23/15  1605  -- 2035          Labs/Imaging Results for orders placed or performed during the hospital encounter of 10/17/20 (from the past 48 hour(s))  Comprehensive metabolic panel     Status: Abnormal   Collection Time: 10/17/20  1:22 PM  Result Value Ref Range   Sodium 139 135 - 145 mmol/L   Potassium 3.8 3.5 - 5.1 mmol/L   Chloride 105 98 - 111 mmol/L   CO2 22 22 - 32 mmol/L   Glucose, Bld 113 (H) 70 - 99 mg/dL    Comment: Glucose reference range applies only to samples taken after fasting for at least 8 hours.   BUN 31 (H) 8 - 23 mg/dL   Creatinine, Ser 1.47 (H) 0.44 - 1.00 mg/dL   Calcium 8.8 (L) 8.9 - 10.3 mg/dL   Total Protein 6.6 6.5 - 8.1 g/dL   Albumin 3.1 (L) 3.5 - 5.0 g/dL   AST 14 (L) 15 - 41 U/L   ALT 11 0 - 44 U/L   Alkaline Phosphatase 74 38 - 126 U/L   Total Bilirubin 0.7 0.3 - 1.2 mg/dL   GFR, Estimated 39 (L) >60 mL/min    Comment: (NOTE) Calculated using the CKD-EPI Creatinine Equation (2021)    Anion gap 12 5 - 15    Comment: Performed at [facility], [place] [address] [place], [place] [address]  CBC with Differential     Status: Abnormal   Collection Time: 10/17/20  1:22 PM  Result Value Ref Range   WBC 15.3 (H) 4.0 - 10.5 K/uL   RBC 3.43 (L) 3.87 - 5.11 MIL/uL   Hemoglobin 10.4 (L) 12.0 - 15.0 g/dL   HCT 31.7 (L) 36.0 - 46.0 %   MCV 92.4 80.0 - 100.0 fL   MCH 30.3 26.0 - 34.0 pg   MCHC 32.8 30.0 - 36.0 g/dL   RDW 12.1 11.5 - 15.5 %   Platelets 402 (H) 150 - 400 K/uL   nRBC 0.0 0.0 - 0.2 %   Neutrophils Relative %  78 %   Neutro Abs 12.1 (H) 1.7 - 7.7 K/uL   Lymphocytes Relative 12 %   Lymphs Abs 1.8 0.7 - 4.0 K/uL   Monocytes Relative 7 %   Monocytes Absolute 1.1 (H) 0.1 - 1.0 K/uL   Eosinophils Relative 1 %   Eosinophils Absolute 0.1 0.0 - 0.5 K/uL   Basophils Relative 1 %   Basophils Absolute 0.1 0.0 - 0.1 K/uL   Immature Granulocytes 1 %   Abs Immature Granulocytes 0.12 (H) 0.00 - 0.07 K/uL    Comment: Performed at [facility], [place] [address], [place], [place] [address]  Lactic acid, plasma     Status: None   Collection Time: 10/17/20  1:22 PM  Result Value Ref Range   Lactic Acid, Venous 1.1 0.5 - 1.9 mmol/L    Comment: Performed at [facility], [place] [address], [place], [place] [address]  Culture, blood (routine x 2)     Status: None (Preliminary result)   Collection Time: 10/17/20  1:22 PM   Specimen: BLOOD  Result Value Ref Range   Specimen Description      BLOOD SITE NOT SPECIFIED Performed at [facility], [place] [address] [place], [place] [address]    Special Requests      BOTTLES DRAWN AEROBIC AND ANAEROBIC Blood Culture results may not be optimal due to an inadequate volume of blood received in culture bottles Performed at [facility], [place] [address], [place], [place] [address]    Culture PENDING    Report Status PENDING   Lactic acid, plasma     Status: None   Collection Time: 10/17/20  8:30 PM  Result Value Ref Range   Lactic Acid, Venous 1.3 0.5 - 1.9 mmol/L    Comment: Performed at [facility], [place] [address], [place], [place] [address]  APTT     Status: None   Collection Time: 10/17/20  8:30 PM  Result Value Ref Range   aPTT 32 24 - 36 seconds    Comment: Performed at [facility], [place] [address] [place], [place] [address]   No results found.  Pending Labs Unresulted Labs (From admission, onward)          Start     Ordered   10/18/20 2409  Basic metabolic  panel  Daily,   R      10/17/20 1759   10/18/20 0500  CBC  Daily,   R      10/17/20 1759   10/18/20 0500  Protime-INR  Tomorrow morning,   R        10/17/20 1759   10/17/20 1757  HIV Antibody (routine testing w rflx)  (HIV Antibody (Routine testing w reflex) panel)  Once,   STAT        10/17/20 1759   10/17/20 1757  Brain natriuretic peptide  Once,   STAT        10/17/20 1759   10/17/20 1611  SARS CORONAVIRUS 2 (TAT 6-24 HRS) Nasopharyngeal Nasopharyngeal Swab  (Tier 3 - Symptomatic/asymptomatic with Precautions)  Once,   STAT       Question Answer Comment  Is this test for diagnosis or screening Screening   Symptomatic for COVID-19 as defined by CDC No   Hospitalized for COVID-19 No   Admitted to ICU for COVID-19 No   Previously tested for COVID-19 Yes   Resident in a congregate (group) care setting No   Employed in healthcare setting No   Pregnant No   Has patient completed COVID vaccination(s) (2 doses of Pfizer/Moderna 1 dose of [employer]) Unknown      10/17/20 1611   10/17/20 1254  Culture, blood (routine x 2)  BLOOD CULTURE X 2,   STAT      10/17/20 1253          Vitals/Pain Today's Vitals   10/17/20 2100 10/17/20 2115 10/17/20 2128 10/17/20 2215  BP: (!) 103/58 94/60 93/66  (!) 91/56  Pulse: 70 65 66 70  Resp: 16 16 18 16   Temp:   97.8 F (36.6 C)   TempSrc:   Oral   SpO2: 100% 100% 100% 100%  Weight:  Height:      PainSc:   0-No pain     Isolation Precautions Airborne and Contact precautions  Medications Medications  0.9 %  sodium chloride infusion (0 mLs Intravenous Stopped 10/17/20 1900)  heparin injection 5,000 Units (has no administration in time range)  sodium chloride flush (NS) 0.9 % injection 3 mL (has no administration in time range)  sodium chloride flush (NS) 0.9 % injection 3 mL (has no administration in time range)  sodium chloride flush (NS) 0.9 % injection 3 mL (has no administration in time range)  0.9 %  sodium chloride  infusion (has no administration in time range)  acetaminophen (TYLENOL) tablet 650 mg (has no administration in time range)    Or  acetaminophen (TYLENOL) suppository 650 mg (has no administration in time range)  oxyCODONE (Oxy IR/ROXICODONE) immediate release tablet 5 mg (has no administration in time range)  HYDROmorphone (DILAUDID) injection 0.5-1 mg (has no administration in time range)  magnesium citrate solution 1 Bottle (has no administration in time range)  ondansetron (ZOFRAN) tablet 4 mg (has no administration in time range)    Or  ondansetron (ZOFRAN) injection 4 mg (has no administration in time range)  ipratropium (ATROVENT) nebulizer solution 0.5 mg (has no administration in time range)  dextrose 5 %-0.45 % sodium chloride infusion ( Intravenous New Bag/Given 10/17/20 1848)  metroNIDAZOLE (FLAGYL) IVPB 500 mg (has no administration in time range)  ceFEPIme (MAXIPIME) 2 g in sodium chloride 0.9 % 100 mL IVPB (has no administration in time range)  celecoxib (CELEBREX) capsule 200 mg (has no administration in time range)  nitroGLYCERIN (NITROSTAT) SL tablet 0.4 mg (has no administration in time range)  diazepam (VALIUM) tablet 5 mg (has no administration in time range)  fluticasone furoate-vilanterol (BREO ELLIPTA) 100-25 MCG/INH 1 puff (1 puff Inhalation Not Given 10/17/20 2108)  ipratropium (ATROVENT) nebulizer solution 0.5 mg (has no administration in time range)  0.9 %  sodium chloride infusion (has no administration in time range)  norepinephrine (LEVOPHED) 4mg  in 234mL premix infusion (3 mcg/min Intravenous Rate/Dose Verify 10/17/20 1956)  oxyCODONE (Oxy IR/ROXICODONE) immediate release tablet 10 mg (has no administration in time range)  sodium chloride 0.9 % bolus 1,000 mL (0 mLs Intravenous Stopped 10/17/20 1726)  ciprofloxacin (CIPRO) IVPB 400 mg (0 mg Intravenous Stopped 10/17/20 1713)  metroNIDAZOLE (FLAGYL) IVPB 500 mg (0 mg Intravenous Stopped 10/17/20 1720)  sodium  chloride 0.9 % bolus 1,000 mL (0 mLs Intravenous Stopped 10/17/20 1842)  fentaNYL (SUBLIMAZE) injection 25 mcg (25 mcg Intravenous Given 10/17/20 1828)  lactated ringers bolus 1,000 mL (0 mLs Intravenous Stopped 10/17/20 1907)  ceFEPIme (MAXIPIME) 2 g in sodium chloride 0.9 % 100 mL IVPB (0 g Intravenous Stopped 10/17/20 1902)    Mobility walks with person assist

## 2020-10-17 NOTE — ED Notes (Signed)
Attempted to call report to [phone]. Was told by staff they will call back

## 2020-10-17 NOTE — ED Provider Notes (Signed)
[facility] DEPT Provider Note   CSN: [id number] Arrival date & time: 10/17/20  1031     History Chief Complaint  Patient presents with  . Abdominal Pain    [name] is a 65 y.o. female.  HPI She presents for evaluation of abdominal pain for 3 weeks, intermittent vomiting, intermittent diarrhea, malaise, and mild weakness. She saw her PCP yesterday who ordered CT angiogram of the abdomen pelvis, which returned showing colocolonic fistulas. This was apparently unchanged from prior scan in 2019. He has a history of partial colon removal after "a blockage." This was done remotely at an outlying hospital. She denies fever, chills, cough, shortness of breath, headache, neck pain or back pain. She has not had COVID vaccines. There are no other known modifying factors.    Past Medical History:  Diagnosis Date  . Admission for long-term opiate analgesic use 10/24/2019  . Arthritis of right hip 05/29/2016   Formatting of this note might be different from the original. Added automatically from request for surgery [id number]  . BMI 26.0-26.9,adult 03/29/2020  . Cardiomyopathy [facility])    Overview:  Ejection fraction 45% in 2015 Ejection fraction 30 to 35% in November 2018  . Chronic back pain   . Chronic hypoxemic respiratory failure ([place]) 10/24/2019  . Chronic narcotic use 03/23/2015  . Chronic pain of both knees 12/21/2018   Added automatically from request for surgery [id number]  Formatting of this note might be different from the original. Added automatically from request for surgery [id number]  . Chronic systolic congestive heart failure, NYHA class 2 ([place]) 06/19/2017  . COPD (chronic obstructive pulmonary disease) ([place])   . Coronary artery disease involving native coronary artery of native heart without angina pectoris 05/31/2015   Overview:  Abnormal stress test in fall of 2016, cardiac catheterization showed normal coronaries.  . Cystitis 05/17/2020  . Dehydration  05/17/2020  . Diarrhea 05/17/2020  . Dilated cardiomyopathy ([place]) 06/19/2017  . 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 Dyslipidemia 05/31/2015  . Essential hypertension 05/31/2015  . GERD (gastroesophageal reflux disease)   . 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[place]) 07/13/2020  . Hypotension 05/17/2020  . ICD (implantable cardiac defibrillator) in place 2012  . 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 Ileus following gastrointestinal surgery ([place]) 03/26/2015  . Major depressive disorder, single episode, moderate ([place]) 10/24/2019  . Migraine without aura with status migrainosus 10/24/2019  . Nausea alone 04/19/2014  . Obstructive chronic bronchitis with exacerbation ([place]) 10/25/2019  . Other spondylosis with radiculopathy, lumbar region 10/24/2019  . Persistent vomiting 05/17/2020  . Presence of left artificial hip joint 10/24/2019  . PTSD (post-traumatic stress disorder)   . 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 Senile osteoporosis 10/24/2019  . Thyroid disease     Patient Active Problem List   Diagnosis Date Noted  . Mesenteric ischemia ([place]) 10/16/2020  . Thyroid disease   . PTSD (post-traumatic stress disorder)   . 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[place]) 07/13/2020  . Dehydration 05/17/2020  . Hypotension 05/17/2020  . Persistent vomiting 05/17/2020  . Diarrhea 05/17/2020  . Cystitis 05/17/2020  . BMI 26.0-26.9,adult 03/29/2020  . Obstructive chronic bronchitis with exacerbation ([place]) 10/25/2019  . Admission for long-term opiate analgesic use 10/24/2019  . Presence of left artificial hip joint 10/24/2019  . Senile osteoporosis 10/24/2019  . Chronic hypoxemic respiratory failure ([place]) 10/24/2019  . Other spondylosis with radiculopathy, lumbar region 10/24/2019  . Major  depressive disorder, single episode, moderate ([place]) 10/24/2019  . Migraine without aura with status  migrainosus 10/24/2019  . 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 Chronic pain of both knees 12/21/2018  . Chronic systolic congestive heart failure, NYHA class 2 ([place]) 06/19/2017  . Dilated cardiomyopathy ([place]) 06/19/2017  . 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 Arthritis of right hip 05/29/2016  . Coronary artery disease involving native coronary artery of native heart without angina pectoris 05/31/2015  . Dyslipidemia 05/31/2015  . Essential hypertension 05/31/2015  . Ileus following gastrointestinal surgery ([place]) 03/26/2015  . Chronic narcotic use 03/23/2015  . GERD (gastroesophageal reflux disease)   . 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 COPD (chronic obstructive pulmonary disease) ([place])   . Cardiomyopathy ([place])   . Chronic back pain     Past Surgical History:  Procedure Laterality Date  . APPENDECTOMY    . CARDIAC CATHETERIZATION    . CARPAL TUNNEL RELEASE    . CESAREAN SECTION    . CHOLECYSTECTOMY    . CORONARY ANGIOPLASTY    . HAND SURGERY    . ICD GENERATOR CHANGEOUT N/A 05/21/2019   Procedure: ICD GENERATOR CHANGEOUT;  Surgeon: Constance Haw, MD;  Location: [facility];  Service: Cardiovascular;  Laterality: N/A;  . ICD IMPLANT     [employer]  . intestinal blockage 2011    . LAPAROSCOPIC ASSISTED VENTRAL HERNIA REPAIR N/A 03/23/2015   Procedure: LAPAROSCOPIC VENTRAL WALL HERNIA REPAIR;  Surgeon: Michael Boston, MD;  Location: [facility];  Service: General;  Laterality: N/A;  With MESH  . LAPAROSCOPIC LYSIS OF ADHESIONS N/A 03/23/2015   Procedure: LAPAROSCOPIC LYSIS OF ADHESIONS;  Surgeon: Michael Boston, MD;  Location: [facility];  Service: General;  Laterality: N/A;  . NECK SURGERY     fused  . TONSILLECTOMY    . ULNAR NERVE TRANSPOSITION  01/23/2012   Procedure: ULNAR NERVE DECOMPRESSION/TRANSPOSITION;   Surgeon: Ophelia Charter, MD;  Location: [facility];  Service: Neurosurgery;  Laterality: Left;  LEFT ulnar nerve decompression     OB History   No obstetric history on file.     Family History  Problem Relation Age of Onset  . Heart attack Other   . Cancer Other   . Heart failure Other   . Hypertension Mother   . Heart attack Mother   . Alcohol abuse Father   . Hypertension Father   . Heart attack Father   . Anesthesia problems Neg Hx   . Hypotension Neg Hx   . Malignant hyperthermia Neg Hx   . Pseudochol deficiency Neg Hx     Social History   Tobacco Use  . Smoking status: Former Smoker    Packs/day: 0.50    Years: 30.00    Pack years: 15.00    Types: Cigarettes    Quit date: 03/2020    Years since quitting: 0.5  . Smokeless tobacco: Never Used  Vaping Use  . Vaping Use: Never used  Substance Use Topics  . Alcohol use: Yes    Alcohol/week: 2.0 standard drinks    Types: 1 Cans of beer, 1 Standard drinks or equivalent per week    Comment: seldom  . Drug use: No    Home Medications Prior to Admission medications   Medication Sig Start Date End Date Taking? Authorizing Provider  aspirin 81 MG tablet Take 81 mg by mouth daily.  [provider]  Buprenorphine HCl (BELBUCA) 900 MCG FILM Place 900 mcg inside cheek every 12 (twelve) hours.    [provider]  carvedilol (COREG) 3.125 MG tablet TAKE 1 TABLET BY MOUTH TWICE DAILY 10/04/20   Lillard Anes, MD  celecoxib (CELEBREX) 200 MG capsule TAKE ONE CAPSULE BY MOUTH EVERY MORNING 06/15/20   Lillard Anes, MD  clobetasol ointment (TEMOVATE) 0.05 % Apply topically. 02/15/20   [provider]  cyclobenzaprine (FLEXERIL) 10 MG tablet Take 10 mg by mouth 3 (three) times daily as needed for muscle spasms.    [provider]  denosumab (PROLIA) 60 MG/ML SOSY injection Inject 60 mg into the skin every 6 (six) months. 02/14/20   Lillard Anes, MD  diazepam  (VALIUM) 5 MG tablet Take 1 tablet (5 mg total) by mouth 3 (three) times daily as needed. 07/27/20   Lillard Anes, MD  diphenoxylate-atropine (LOMOTIL) 2.5-0.025 MG tablet Take 1 tablet by mouth 4 (four) times daily as needed for diarrhea or loose stools. 05/19/20   Lillard Anes, MD  ENTRESTO 24-26 MG TAKE ONE TABLET BY MOUTH EVERY MORNING and TAKE ONE TABLET BY MOUTH EVERY EVENING 06/15/20   Lillard Anes, MD  EPINEPHRINE 0.3 mg/0.3 mL IJ SOAJ injection Inject 0.3 mLs (0.3 mg total) into the muscle as needed for anaphylaxis. 04/27/20   Lillard Anes, MD  esomeprazole (NEXIUM) 20 MG capsule Take 1 capsule (20 mg total) by mouth daily. 10/02/20   Lillard Anes, MD  fludrocortisone (FLORINEF) 0.1 MG tablet TAKE 1 TABLET BY MOUTH AS DIRECTED. 10/12/20   Lillard Anes, MD  fluticasone [facility]) 50 MCG/ACT nasal spray Place 2 sprays into both nostrils daily as needed (congestion). 01/21/20   Lillard Anes, MD  fluticasone furoate-vilanterol (BREO ELLIPTA) 100-25 MCG/INH AEPB Inhale 1 puff into the lungs daily in the afternoon. 10/03/20   Lillard Anes, MD  furosemide (LASIX) 40 MG tablet TAKE ONE TABLET BY MOUTH EVERY MORNING MAY take 0.5 tablet in THE afternoon IF needed 06/15/20   Lillard Anes, MD  gabapentin (NEURONTIN) 800 MG tablet Take 1 tablet (800 mg total) by mouth 3 (three) times daily. Patient usually takes twice a day 01/21/20   Lillard Anes, MD  ipratropium (ATROVENT) 0.02 % nebulizer solution Take 2.5 mLs (0.5 mg total) by nebulization every 4 (four) hours as needed for wheezing or shortness of breath. 01/21/20   Lillard Anes, MD  Ketoprofen 25 MG CAPS Take by mouth. 02/15/20   [provider]  levocetirizine (XYZAL) 5 MG tablet Take 5 mg by mouth daily as needed. 12/13/19   [provider]  LINZESS 145 MCG CAPS capsule Take 145 mcg by mouth daily. 08/22/20   [provider]   Multiple Vitamins-Minerals (AIRBORNE GUMMIES PO) Take 3 tablets by mouth daily in the afternoon.    [provider]  nitroGLYCERIN (NITROSTAT) 0.4 MG SL tablet Place 1 tablet (0.4 mg total) under the tongue every 5 (five) minutes as needed for chest pain. 01/21/20   Lillard Anes, MD  ondansetron (ZOFRAN) 4 MG tablet Take 1 tablet (4 mg total) by mouth every 8 (eight) hours as needed for nausea or vomiting. 05/17/20   Lillard Anes, MD  Oxycodone HCl 10 MG TABS Take 1 tablet by mouth every 4 (four) hours as needed.    [provider]  ranolazine (RANEXA) 500 MG 12 hr tablet TAKE 1 TABLET(500 MG) BY MOUTH TWICE  DAILY 10/04/20   Park Liter, MD  rosuvastatin (CRESTOR) 20 MG tablet TAKE ONE TABLET BY MOUTH EVERY MORNING 09/27/20   Lillard Anes, MD  sertraline (ZOLOFT) 100 MG tablet Take 0.5 tablets (50 mg total) by mouth daily. 08/11/20   Lillard Anes, MD  tiZANidine (ZANAFLEX) 4 MG tablet Take 4 mg by mouth at bedtime.    [provider]  topiramate (TOPAMAX) 50 MG tablet Take 2 tablets (100 mg total) by mouth at bedtime. 01/21/20   Lillard Anes, MD  Ubrogepant (UBRELVY) 100 MG TABS Take 1 tablet by mouth as needed (May repeat 1 tablet after 2 hours if needed.  Maximum 2 tablets in 24 hours). 01/21/20   Lillard Anes, MD    Allergies    Patient has no known allergies.  Review of Systems   Review of Systems  All other systems reviewed and are negative.   Physical Exam Updated Vital Signs BP (!) 79/52 (BP Location: Right Arm)   Pulse 77   Temp 97.7 F (36.5 C) (Oral)   Resp 18   Ht 5' (1.524 m)   Wt 58.1 kg   SpO2 98%   BMI 25.00 kg/m   Physical Exam Vitals and nursing note reviewed.  Constitutional:      General: She is not in acute distress.    Appearance: She is well-developed and well-nourished. She is not ill-appearing, toxic-appearing or diaphoretic.  HENT:     Head: Normocephalic and  atraumatic.     Right Ear: External ear normal.     Left Ear: External ear normal.  Eyes:     Extraocular Movements: EOM normal.     Conjunctiva/sclera: Conjunctivae normal.     Pupils: Pupils are equal, round, and reactive to light.  Neck:     Trachea: Phonation normal.  Cardiovascular:     Rate and Rhythm: Normal rate and regular rhythm.     Heart sounds: Normal heart sounds.  Pulmonary:     Effort: Pulmonary effort is normal.     Breath sounds: Normal breath sounds.  Chest:     Chest wall: No bony tenderness.  Abdominal:     General: There is no distension.     Palpations: Abdomen is soft. There is no mass.     Tenderness: There is abdominal tenderness (Diffuse, mild). There is no guarding.     Hernia: No hernia is present.  Musculoskeletal:        General: Normal range of motion.     Cervical back: Normal range of motion and neck supple.  Skin:    General: Skin is warm, dry and intact.  Neurological:     Mental Status: She is alert and oriented to person, place, and time.     Cranial Nerves: No cranial nerve deficit.     Sensory: No sensory deficit.     Motor: No abnormal muscle tone.     Coordination: Coordination normal.  Psychiatric:        Mood and Affect: Mood and affect and mood normal.        Behavior: Behavior normal.        Thought Content: Thought content normal.        Judgment: Judgment normal.            ED Results / Procedures / Treatments   Labs (all labs ordered are listed, but only abnormal results are displayed) Labs Reviewed  COMPREHENSIVE METABOLIC PANEL - Abnormal; Notable for the following components:  Result Value   Glucose, Bld 113 (*)    BUN 31 (*)    Creatinine, Ser 1.47 (*)    Calcium 8.8 (*)    Albumin 3.1 (*)    AST 14 (*)    GFR, Estimated 39 (*)    All other components within normal limits  CBC WITH DIFFERENTIAL/PLATELET - Abnormal; Notable for the following components:   WBC 15.3 (*)    RBC 3.43 (*)    Hemoglobin  10.4 (*)    HCT 31.7 (*)    Platelets 402 (*)    Neutro Abs 12.1 (*)    Monocytes Absolute 1.1 (*)    Abs Immature Granulocytes 0.12 (*)    All other components within normal limits  CULTURE, BLOOD (ROUTINE X 2)  CULTURE, BLOOD (ROUTINE X 2)  SARS CORONAVIRUS 2 (TAT 6-24 HRS)  LACTIC ACID, PLASMA  LACTIC ACID, PLASMA    EKG None  Radiology No results found.  Procedures .Critical Care Performed by: Daleen Bo, MD Authorized by: Daleen Bo, MD   Critical care provider statement:    Critical care time (minutes):  50   Critical care start time:  10/17/2020 9:20 AM   Critical care end time:  10/17/2020 4:20 PM   Critical care time was exclusive of:  Separately billable procedures and treating other patients   Critical care was necessary to treat or prevent imminent or life-threatening deterioration of the following conditions:  Sepsis   Critical care was time spent personally by me on the following activities:  Blood draw for specimens, development of treatment plan with patient or surrogate, discussions with consultants, evaluation of patient's response to treatment, examination of patient, obtaining history from patient or surrogate, ordering and performing treatments and interventions, ordering and review of laboratory studies, pulse oximetry, re-evaluation of patient's condition, review of old charts and ordering and review of radiographic studies     Medications Ordered in ED Medications  0.9 %  sodium chloride infusion ( Intravenous New Bag/Given 10/17/20 1324)  ciprofloxacin (CIPRO) IVPB 400 mg (400 mg Intravenous New Bag/Given 10/17/20 1613)  metroNIDAZOLE (FLAGYL) IVPB 500 mg (500 mg Intravenous New Bag/Given 10/17/20 1620)  sodium chloride 0.9 % bolus 1,000 mL (1,000 mLs Intravenous New Bag/Given 10/17/20 1607)    ED Course  I have reviewed the triage vital signs and the nursing notes.  Pertinent labs & imaging results that were available during my care of the  patient were reviewed by me and considered in my medical decision making (see chart for details).  Clinical Course as of 10/17/20 1624  Tue Oct 17, 2020  1622 Discussed the case with Dr. Therisa Doyne, gastroenterologist on-call.  She will see the patient as a Optometrist. [EW]    Clinical Course User Index [EW] Daleen Bo, MD   MDM Rules/Calculators/A&P                           Patient Vitals for the past 24 hrs:  BP Temp Temp src Pulse Resp SpO2 Height Weight  10/17/20 1555 (!) 79/52 -- -- 77 18 98 % -- --  10/17/20 1530 (!) 76/51 -- -- 74 17 99 % -- --  10/17/20 1300 (!) 84/53 -- -- 80 15 100 % -- --  10/17/20 1050 -- -- -- -- -- -- 5' (1.524 m) 58.1 kg  10/17/20 1037 (!) 91/52 97.7 F (36.5 C) Oral (!) 103 16 97 % -- --    4:24 PM Reevaluation  with update and discussion. After initial assessment and treatment, an updated evaluation reveals she is comfortable without further complaints, eating and drinking easily.Daleen Bo   Medical Decision Making:  This patient is presenting for evaluation of abdominal pain with vomiting and diarrhea intermittently, which does require a range of treatment options, and is a complaint that involves a moderate risk of morbidity and mortality. The differential diagnoses includeAcute intra-abdominal infection, chronic intestinal disorder following partial colonic resection. I decided to review old records, and in summary elderly female with nonspecific ongoing symptoms, and abnormal CT, sent here by her PCP.  I did not require additional historical information from anyone.  Clinical Laboratory Tests Ordered, included CBC, Metabolic panel, Urinalysis and Lactic acid, blood cultures. Review indicates normal except white count high, hemoglobin low, platelets elevated, glucose high, BUN high, creatinine high, calcium low, albumin low, AST low, GFR low.     Critical Interventions-like evaluation, laboratory testing, review of CT scan from yesterday,  IV fluids, orthostatic blood pressure and pulse, reevaluation after period of observation.  After These Interventions, the Patient was reevaluated and was found with hypotension, I suspect this is related to her decreased oral intake, and malaise, likely associated with the coronary maldevelopment CT imaging.  It does not appear that she will require surgical intervention, in the short-term, empiric antibiotics begun for possible colitis, gastroenterology consulted, and will call hospitalist to arrange for hospitalization.  CRITICAL CARE-yes Performed by: Daleen Bo  Nursing Notes Reviewed/ Care Coordinated Applicable Imaging Reviewed Interpretation of Laboratory Data incorporated into ED treatment   4:24 PM-Consult complete with hospitalist. Patient case explained and discussed. He does not agree to admit patient until she has been evaluated by general surgery. Call ended at 4:30 PM    Final Clinical Impression(s) / ED Diagnoses Final diagnoses:  Diverticulitis    Rx / DC Orders ED Discharge Orders    None       Daleen Bo, MD 10/18/20 [phone]

## 2020-10-17 NOTE — H&P (Signed)
History and Physical   Patient: [name]                            PCP: Crystal Anes, MD                    DOB: 10-22-1955            DOA: 10/17/2020 ZOX:[id number]             DOS: 10/17/2020, 6:28 PM  Patient coming from:   HOME  I have personally reviewed patient's medical records, in electronic medical records, including:  [facility], and care everywhere.    Chief Complaint:   Chief Complaint  Patient presents with  . Abdominal Pain    History of present illness:    [name] is a 65 y.o. 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"    Patient Denies having: Fever, Chills, Cough, SOB, Chest Pain,  N//D, headache, dizziness, lightheadedness,  Dysuria, Joint pain, rash, open wounds  ED Course:  On presentation to the ED blood pressure was as low was 79/52 post IV fluid resuscitation 1-2 L Blood pressure is improved to 91/52 Vitals: Temp 97.7, pulse 103, respiratory rate 16, satting 97% on room air Abnormal labs; WBC 15.3, hemoglobin 10.4, platelets 402, neutrophil 12.1, creatinine 1.47, lactic acid 1.1 Influenza A/B negative SARS-CoV-2 screening negative  Outside CT abdomen pelvis --- reported: " Abnormal posterior, proximal sigmoid multiple colo-- colonic fistula and adhesion adjacent to a loop, wall thickening, consistent with acute diverticulitis or other infectious or inflammatory colitis... s/p cholecystectomy and  appendectomy Patient was started on IV antibiotics of ciprofloxacin on Flagyl Blood cultures were obtained  ED has called and had an extensive discussion with general surgery Dr. Harlow Asa   Review of Systems: As per HPI, otherwise 10 point review of systems were negative.   ----------------------------------------------------------------------------------------------------------------------  No Known Allergies  Home MEDs:  Prior to Admission medications   Medication Sig Start Date End Date Taking? Authorizing Provider  aspirin 81 MG tablet Take 81 mg by mouth daily.    [provider]  Buprenorphine HCl (BELBUCA) 900 MCG FILM Place 900 mcg inside cheek every 12 (twelve) hours.    [provider]  carvedilol (COREG) 3.125 MG tablet TAKE 1 TABLET BY MOUTH TWICE DAILY 10/04/20   Crystal Anes, MD  celecoxib (CELEBREX) 200 MG capsule TAKE ONE CAPSULE BY MOUTH EVERY MORNING 06/15/20   Crystal Anes, MD  clobetasol ointment (TEMOVATE) 0.05 % Apply topically. 02/15/20   [provider]  cyclobenzaprine (FLEXERIL) 10 MG tablet Take 10 mg by mouth 3 (three) times daily as needed for muscle spasms.    [provider]  denosumab (PROLIA) 60 MG/ML SOSY injection Inject 60 mg into the skin every 6 (six) months. 02/14/20   Crystal Anes, MD  diazepam (VALIUM) 5 MG tablet Take 1 tablet (5 mg total) by mouth 3 (three) times daily as needed. 07/27/20   Crystal Anes, MD  diphenoxylate-atropine (LOMOTIL) 2.5-0.025 MG  tablet Take 1 tablet by mouth 4 (four) times daily as needed for diarrhea or loose stools. 05/19/20   Crystal Anes, MD  ENTRESTO 24-26 MG TAKE ONE TABLET BY MOUTH EVERY MORNING and TAKE ONE TABLET BY MOUTH EVERY EVENING 06/15/20   Crystal Anes, MD  EPINEPHRINE 0.3 mg/0.3 mL IJ SOAJ injection Inject 0.3 mLs (0.3 mg total) into the muscle as needed for anaphylaxis. 04/27/20   Crystal Anes, MD  esomeprazole  (NEXIUM) 20 MG capsule Take 1 capsule (20 mg total) by mouth daily. 10/02/20   Crystal Anes, MD  fludrocortisone (FLORINEF) 0.1 MG tablet TAKE 1 TABLET BY MOUTH AS DIRECTED. 10/12/20   Crystal Anes, MD  fluticasone [facility]) 50 MCG/ACT nasal spray Place 2 sprays into both nostrils daily as needed (congestion). 01/21/20   Crystal Anes, MD  fluticasone furoate-vilanterol (BREO ELLIPTA) 100-25 MCG/INH AEPB Inhale 1 puff into the lungs daily in the afternoon. 10/03/20   Crystal Anes, MD  furosemide (LASIX) 40 MG tablet TAKE ONE TABLET BY MOUTH EVERY MORNING MAY take 0.5 tablet in THE afternoon IF needed 06/15/20   Crystal Anes, MD  gabapentin (NEURONTIN) 800 MG tablet Take 1 tablet (800 mg total) by mouth 3 (three) times daily. Patient usually takes twice a day 01/21/20   Crystal Anes, MD  ipratropium (ATROVENT) 0.02 % nebulizer solution Take 2.5 mLs (0.5 mg total) by nebulization every 4 (four) hours as needed for wheezing or shortness of breath. 01/21/20   Crystal Anes, MD  Ketoprofen 25 MG CAPS Take by mouth. 02/15/20   [provider]  levocetirizine (XYZAL) 5 MG tablet Take 5 mg by mouth daily as needed. 12/13/19   [provider]  LINZESS 145 MCG CAPS capsule Take 145 mcg by mouth daily. 08/22/20   [provider]  Multiple Vitamins-Minerals (AIRBORNE GUMMIES PO) Take 3 tablets by mouth daily in the afternoon.    [provider]  nitroGLYCERIN (NITROSTAT) 0.4 MG SL tablet Place 1 tablet (0.4 mg total) under the tongue every 5 (five) minutes as needed for chest pain. 01/21/20   Crystal Anes, MD  ondansetron (ZOFRAN) 4 MG tablet Take 1 tablet (4 mg total) by mouth every 8 (eight) hours as needed for nausea or vomiting. 05/17/20   Crystal Anes, MD  Oxycodone HCl 10 MG TABS Take 1 tablet by mouth every 4 (four) hours as needed.    [provider]  ranolazine (RANEXA) 500 MG 12 hr  tablet TAKE 1 TABLET(500 MG) BY MOUTH TWICE DAILY 10/04/20   Park Liter, MD  rosuvastatin (CRESTOR) 20 MG tablet TAKE ONE TABLET BY MOUTH EVERY MORNING 09/27/20   Crystal Anes, MD  sertraline (ZOLOFT) 100 MG tablet Take 0.5 tablets (50 mg total) by mouth daily. 08/11/20   Crystal Anes, MD  tiZANidine (ZANAFLEX) 4 MG tablet Take 4 mg by mouth at bedtime.    [provider]  topiramate (TOPAMAX) 50 MG tablet Take 2 tablets (100 mg total) by mouth at bedtime. 01/21/20   Crystal Anes, MD  Ubrogepant (UBRELVY) 100 MG TABS Take 1 tablet by mouth as needed (May repeat 1 tablet after 2 hours if needed.  Maximum 2 tablets in 24 hours). 01/21/20   Crystal Anes, MD    PRN MEDs: sodium chloride, acetaminophen **OR** acetaminophen, bisacodyl, HYDROmorphone (DILAUDID) injection, ipratropium, magnesium citrate, ondansetron **OR** ondansetron (ZOFRAN) IV, oxyCODONE, senna-docusate, sodium chloride flush, traZODone  Past Medical History:  Diagnosis Date  . Admission for long-term opiate analgesic use 10/24/2019  . Arthritis of right hip 05/29/2016   Formatting of this note might be different from the original. Added automatically from request for surgery [phone]  . BMI 26.0-26.9,adult 03/29/2020  . Cardiomyopathy [facility])    Overview:  Ejection fraction 45% in 2015 Ejection fraction 30 to 35% in November 2018  . Chronic back pain   . Chronic hypoxemic respiratory failure ([place]) 10/24/2019  . Chronic narcotic use 03/23/2015  . Chronic pain of both knees 12/21/2018   Added automatically from request for surgery [id number]  Formatting of this note might be different from the original. Added automatically from request for surgery [phone]  . Chronic systolic congestive heart failure, NYHA class 2 ([place]) 06/19/2017  . COPD (chronic obstructive pulmonary disease) ([place])   . Coronary artery disease involving native coronary artery of native heart without angina pectoris 05/31/2015    Overview:  Abnormal stress test in fall of 2016, cardiac catheterization showed normal coronaries.  . Cystitis 05/17/2020  . Dehydration 05/17/2020  . Diarrhea 05/17/2020  . Dilated cardiomyopathy ([place]) 06/19/2017  . 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 Dyslipidemia 05/31/2015  . Essential hypertension 05/31/2015  . GERD (gastroesophageal reflux disease)   . 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[place]) 07/13/2020  . Hypotension 05/17/2020  . ICD (implantable cardiac defibrillator) in place 2012  . 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 Ileus following gastrointestinal surgery ([place]) 03/26/2015  . Major depressive disorder, single episode, moderate ([place]) 10/24/2019  . Migraine without aura with status migrainosus 10/24/2019  . Nausea alone 04/19/2014  . Obstructive chronic bronchitis with exacerbation ([place]) 10/25/2019  . Other spondylosis with radiculopathy, lumbar region 10/24/2019  . Persistent vomiting 05/17/2020  . Presence of left artificial hip joint 10/24/2019  . PTSD (post-traumatic stress disorder)   . 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 Senile osteoporosis 10/24/2019  . Thyroid disease     Past Surgical History:  Procedure Laterality Date  . APPENDECTOMY    . CARDIAC CATHETERIZATION    . CARPAL TUNNEL RELEASE    . CESAREAN SECTION    . CHOLECYSTECTOMY    . CORONARY ANGIOPLASTY    . HAND SURGERY    . ICD GENERATOR CHANGEOUT N/A 05/21/2019   Procedure: ICD GENERATOR CHANGEOUT;  Surgeon: Constance Haw, MD;  Location: [facility];  Service: Cardiovascular;  Laterality: N/A;  . ICD IMPLANT     [employer]onic  . intestinal blockage 2011    . LAPAROSCOPIC ASSISTED VENTRAL HERNIA REPAIR N/A 03/23/2015   Procedure: LAPAROSCOPIC VENTRAL WALL HERNIA REPAIR;  Surgeon: Michael Boston, MD;  Location: [facility];  Service: General;  Laterality: N/A;  With MESH   . LAPAROSCOPIC LYSIS OF ADHESIONS N/A 03/23/2015   Procedure: LAPAROSCOPIC LYSIS OF ADHESIONS;  Surgeon: Michael Boston, MD;  Location: [facility];  Service: General;  Laterality: N/A;  . NECK SURGERY     fused  . TONSILLECTOMY    . ULNAR NERVE TRANSPOSITION  01/23/2012   Procedure: ULNAR NERVE DECOMPRESSION/TRANSPOSITION;  Surgeon: Ophelia Charter, MD;  Location: [facility];  Service: Neurosurgery;  Laterality: Left;  LEFT ulnar nerve decompression     reports that she quit smoking about 6 months ago. Her smoking use included cigarettes. She has a 15.00 pack-year smoking history. She has never used smokeless tobacco. She reports current alcohol use of about 2.0 standard drinks of alcohol per  week. She reports that she does not use drugs.   Family History  Problem Relation Age of Onset  . Heart attack Other   . Cancer Other   . Heart failure Other   . Hypertension Mother   . Heart attack Mother   . Alcohol abuse Father   . Hypertension Father   . Heart attack Father   . Anesthesia problems Neg Hx   . Hypotension Neg Hx   . Malignant hyperthermia Neg Hx   . Pseudochol deficiency Neg Hx     Physical Exam:   Vitals:   10/17/20 1700 10/17/20 1727 10/17/20 1752 10/17/20 1827  BP: (!) 89/63 (!) 92/55 (!) 84/49 (!) 74/52  Pulse: 82 83 92 78  Resp: (!) 21 16 16 18   Temp:    97.7 F (36.5 C)  TempSrc:    Oral  SpO2: 100% 98% 100% 100%  Weight:      Height:       Constitutional: NAD, calm, comfortable Eyes: PERRL, lids and conjunctivae normal ENMT: Mucous membranes are moist. Posterior pharynx clear of any exudate or lesions.Normal dentition.  Neck: normal, supple, no masses, no thyromegaly Respiratory: clear to auscultation bilaterally, no wheezing, no crackles. Normal respiratory effort. No accessory muscle use.  Cardiovascular: Regular rate and rhythm, no murmurs / rubs / gallops. No extremity edema. 2+ pedal pulses. No carotid bruits.  Abdomen: Diffuse tenderness, no masses  palpated. No hepatosplenomegaly. Bowel sounds positive.  Musculoskeletal: no clubbing / cyanosis. No joint deformity upper and lower extremities. Good ROM, no contractures. Normal muscle tone.  Neurologic: CN II-XII grossly intact. Sensation intact, DTR normal. Strength 5/5 in all 4.  Psychiatric: Normal judgment and insight. Alert and oriented x 3. Normal mood.  Skin: no rashes, lesions, ulcers. No induration Decubitus/ulcers:  Wounds: per nursing documentation         Labs on admission:    I have personally reviewed following labs and imaging studies  CBC: Recent Labs  Lab 10/17/20 1322  WBC 15.3*  NEUTROABS 12.1*  HGB 10.4*  HCT 31.7*  MCV 92.4  PLT 619*   Basic Metabolic Panel: Recent Labs  Lab 10/17/20 1322  NA 139  K 3.8  CL 105  CO2 22  GLUCOSE 113*  BUN 31*  CREATININE 1.47*  CALCIUM 8.8*   GFR: Estimated Creatinine Clearance: 30 mL/min (A) (by C-G formula based on SCr of 1.47 mg/dL (H)). Liver Function Tests: Recent Labs  Lab 10/17/20 1322  AST 14*  ALT 11  ALKPHOS 74  BILITOT 0.7  PROT 6.6  ALBUMIN 3.1*   No results for input(s): LIPASE, AMYLASE in the last 168 hours. No results for input(s): AMMONIA in the last 168 hours. Coagulation Profile: No results for input(s): INR, PROTIME in the last 168 hours. Cardiac Enzymes: No results for input(s): CKTOTAL, CKMB, CKMBINDEX, TROPONINI in the last 168 hours. BNP (last 3 results) Recent Labs    12/02/19 1055 09/05/20 1138  PROBNP 1,044* 546*   HbA1C: No results for input(s): HGBA1C in the last 72 hours. CBG: No results for input(s): GLUCAP in the last 168 hours. Lipid Profile: No results for input(s): CHOL, HDL, LDLCALC, TRIG, CHOLHDL, LDLDIRECT in the last 72 hours. Thyroid Function Tests: No results for input(s): TSH, T4TOTAL, FREET4, T3FREE, THYROIDAB in the last 72 hours. Anemia Panel: No results for input(s): VITAMINB12, FOLATE, FERRITIN, TIBC, IRON, RETICCTPCT in the last 72  hours. Urine analysis:    Component Value Date/Time   COLORURINE YELLOW 03/26/2015 1441  APPEARANCEUR CLOUDY (A) 03/26/2015 1441   LABSPEC >1.046 (H) 03/26/2015 1441   PHURINE 7.0 03/26/2015 1441   GLUCOSEU NEGATIVE 03/26/2015 1441   HGBUR MODERATE (A) 03/26/2015 1441   BILIRUBINUR negative 07/13/2020 1053   KETONESUR negative 07/13/2020 1053   KETONESUR NEGATIVE 03/26/2015 1441   PROTEINUR NEGATIVE 03/26/2015 1441   UROBILINOGEN 0.2 07/13/2020 1053   UROBILINOGEN 1.0 03/26/2015 1441   NITRITE Negative 07/13/2020 1053   NITRITE POSITIVE (A) 03/26/2015 1441   LEUKOCYTESUR Moderate (2+) (A) 07/13/2020 1053     Radiologic Exams on Admission:   No results found.  EKG:   Independently reviewed.  Orders placed or performed in visit on 09/05/20  . EKG 12-Lead   ---------------------------------------------------------------------------------------------------------------------------------------    Assessment / Plan:   Principal Problem:   Sepsis ([place]) Active Problems:   Hypotension   Abdominal pain   COPD (chronic obstructive pulmonary disease) (HCC)   Cardiomyopathy (HCC)   Chronic back pain   GERD (gastroesophageal reflux disease)   Chronic systolic congestive heart failure, NYHA class 2 ([place])   Dilated cardiomyopathy ([place])   ICD (implantable cardioverter-defibrillator) in place   Dyslipidemia   Essential hypertension   Sepsis  -Patient meets septic criteria due to profound hypotension, acute renal failure, leukocytosis, with likely source of intra-abdominal infection, global colonic fistula, possible diverticulitis -We will continue evaluation for severe sepsis/septic shock -Will be admitted to stepdown at this time ---  -Per sepsis protocol we will continue with aggressive IV fluid resuscitation -Blood cultures were sent obtain in ED will follow closely -If after second boluses patient remained to be hypotensive patient will be transferred to ICU with  anticipation of initiating pressors -Lactic at this time remain normal at 1.1, repeating lactic acid -Patient was initiated on broad-spectrum IV antibiotics of Cipro and Flagyl will broaden antibiotics to cefepime and Flagyl.    Hypotensive -Likely due to above, will continue IV fluid resuscitation per sepsis protocol -We will monitor very closely, if no improvement after second bolus    Addendum: Patient remained hypotensive despite 2 L of IV fluid NS,  on her third liter of lactated Ringer. Records indicate patient confirmed that she has cardiomyopathy with biventricular ICD, her ejection fraction in April 2021 was 35%... Discussed the case back with the ED physician-we are recommending patient to be transferred to ICU, will need pressors to maintain viable blood pressure.  Active Problems:    COPD (chronic obstructive pulmonary disease) (HCC)-patient is not O2 dependent, will continue her inhalers, in no respiratory stress, as needed DuoNeb treatment, supplemental oxygen if needed    Cardiomyopathy [facility]), with biventricular ICD -cardiologist at [facility], last echocardiogram April 2021 reporting ejection fraction 35-40%, cardiomyopathy -we will continue her medications, (mindful of her hypotensive state at this time)    Chronic back pain -Home narcotics reviewed, will be resumed carefully as her blood pressure remains low   GERD (gastroesophageal reflux disease) -her PPI will be switched to IV   Chronic systolic congestive heart failure, NYHA class 2 ([place]) -currently needing IV fluid, mindful of not getting her volume overloaded, ejection fraction per last echo 35%, thus far received 2 L of IV fluids for sepsis on on her third liter -as blood pressure remained soft, intensive care will be consulted for initiating pressors   Dilated cardiomyopathy ([place]) Biventricular ICD (implantable cardioverter-defibrillator) in place -currently stable no complaints   Dyslipidemia -continue statins bad  luck with you guys admit that last admission every time I get dialyzed in the mid  Essential hypertension--we will holding all blood pressure medications due to hypertensive state   Cultures:  -Blood cultures x2 Antimicrobial: In ED initiated on Flagyl and Rocephin Will initiate cefepime and Flagyl IV >>  Consults called:  General surgery -------------------------------------------------------------------------------------------------------------------------------------------- DVT prophylaxis: SCD/Compression stockings and Heparin SQ Code Status:   Code Status: Full Code   Admission status: Patient will be admitted as Inpatient, with a greater than 2 midnight length of stay. Level of care: Progressive   Family Communication:  none at bedside  (The above findings and plan of care has been discussed with patient in detail, the patient expressed understanding and agreement of above plan)  --------------------------------------------------------------------------------------------------------------------------------------------------  Disposition Plan: TBD Status is: Inpatient  Remains inpatient appropriate because:Hemodynamically unstable and Inpatient level of care appropriate due to severity of illness   Dispo: The patient is from: Home              Anticipated d/c is to: Home              Anticipated d/c date is: > 3 days              Patient currently is not medically stable to d/c.   Difficult to place patient No   ----------------------------------------------------------------------------------------------------------------------------------------------------  Time spent: > than  75  Min.   SIGNED: Deatra James, MD, FHM. [facility],  Pager (Please use [email or web address] to page to text)  If 7PM-7AM, please contact night-coverage [email or web address],  10/17/2020, 6:28 PM

## 2020-10-17 NOTE — ED Notes (Signed)
Bed placement called r/t bed placement ICU. Page out to [employer] doctor to correct admission order to admit pt to ICU.

## 2020-10-17 NOTE — ED Notes (Signed)
Coming from [place], PCP states she had a CT scan done yeaterday-show colon fistulas-sending to ED for evaluation

## 2020-10-17 NOTE — Progress Notes (Addendum)
eLink Physician-Brief Progress Note Patient Name: [name] DOB: 1956-02-10 MRN: [id number]   Date of Service  10/17/2020  HPI/Events of Note  64/F with HFrEF, COPD, CAD, recurrent incisional hernias s/p repair, presenting with abdominal pain.  CT showing multiple colo-colonic fistulas with acute diverticulitis.  Pt hypotensive after IVF resuscitation and started on levophed.  Pt on empiric antibiotics.   Pt is awake and alert, not in distress.  BP 100/50, HR 70, 10, O2 sats 100% on 2L.  eICU Interventions  A>  Septic shock Colo-colonic fistulas with diverticulitis CHFrEF AKI  Plan> Continue antibiotics, levophed to keep MAP >65. No urgent operative procedure recommended by surgery. NPO. Pain control.  Lovenox for DVT prophylaxis.      Intervention Category Evaluation Type: New Patient Evaluation  Elsie Lincoln 10/17/2020, 11:00 PM   4:10 AM Notified of hypokalemia with K 3.1, crea improving at 1.09.  Plan> Replete K - 30meq IV ordered.

## 2020-10-17 NOTE — ED Notes (Signed)
Pt walk great while ambulating no complain of SOB ir dizziness.

## 2020-10-18 ENCOUNTER — Other Ambulatory Visit: Payer: Self-pay

## 2020-10-18 DIAGNOSIS — K5792 Diverticulitis of intestine, part unspecified, without perforation or abscess without bleeding: Secondary | ICD-10-CM | POA: Diagnosis not present

## 2020-10-18 DIAGNOSIS — A419 Sepsis, unspecified organism: Secondary | ICD-10-CM | POA: Diagnosis not present

## 2020-10-18 DIAGNOSIS — N179 Acute kidney failure, unspecified: Secondary | ICD-10-CM | POA: Diagnosis not present

## 2020-10-18 DIAGNOSIS — R6521 Severe sepsis with septic shock: Secondary | ICD-10-CM | POA: Diagnosis not present

## 2020-10-18 LAB — BLOOD CULTURE ID PANEL (REFLEXED) - BCID2

## 2020-10-18 LAB — BASIC METABOLIC PANEL
Anion gap: 10 (ref 5–15)
BUN: 23 mg/dL (ref 8–23)
CO2: 18 mmol/L — ABNORMAL LOW (ref 22–32)
Calcium: 7.4 mg/dL — ABNORMAL LOW (ref 8.9–10.3)
Chloride: 114 mmol/L — ABNORMAL HIGH (ref 98–111)
Creatinine, Ser: 1.09 mg/dL — ABNORMAL HIGH (ref 0.44–1.00)
GFR, Estimated: 56 mL/min — ABNORMAL LOW (ref >60)
Glucose, Bld: 148 mg/dL — ABNORMAL HIGH (ref 70–99)
Potassium: 3.1 mmol/L — ABNORMAL LOW (ref 3.5–5.1)
Sodium: 142 mmol/L (ref 135–145)

## 2020-10-18 LAB — BRAIN NATRIURETIC PEPTIDE: B Natriuretic Peptide: 193.4 pg/mL — ABNORMAL HIGH (ref 0.0–100.0)

## 2020-10-18 LAB — CBC
HCT: 30 % — ABNORMAL LOW (ref 36.0–46.0)
Hemoglobin: 9.6 g/dL — ABNORMAL LOW (ref 12.0–15.0)
MCH: 30.1 pg (ref 26.0–34.0)
MCHC: 32 g/dL (ref 30.0–36.0)
MCV: 94 fL (ref 80.0–100.0)
Platelets: 389 10*3/uL (ref 150–400)
RBC: 3.19 MIL/uL — ABNORMAL LOW (ref 3.87–5.11)
RDW: 12.2 % (ref 11.5–15.5)
WBC: 13.2 10*3/uL — ABNORMAL HIGH (ref 4.0–10.5)
nRBC: 0 % (ref 0.0–0.2)

## 2020-10-18 LAB — SEDIMENTATION RATE: Sed Rate: 52 mm/hr — ABNORMAL HIGH (ref 0–22)

## 2020-10-18 LAB — HIV ANTIBODY (ROUTINE TESTING W REFLEX): HIV Screen 4th Generation wRfx: NONREACTIVE

## 2020-10-18 LAB — PROTIME-INR
INR: 1 (ref 0.8–1.2)
Prothrombin Time: 12.9 seconds (ref 11.4–15.2)

## 2020-10-18 LAB — GLUCOSE, CAPILLARY: Glucose-Capillary: 125 mg/dL — ABNORMAL HIGH (ref 70–99)

## 2020-10-18 LAB — SARS CORONAVIRUS 2 (TAT 6-24 HRS): SARS Coronavirus 2: NEGATIVE

## 2020-10-18 LAB — MRSA PCR SCREENING: MRSA by PCR: NEGATIVE

## 2020-10-18 MED ORDER — BOOST / RESOURCE BREEZE PO LIQD CUSTOM
1.0000 | Freq: Three times a day (TID) | ORAL | Status: DC
  Administered 2020-10-18 – 2020-10-22 (×9): 1 via ORAL

## 2020-10-18 MED ORDER — POTASSIUM CHLORIDE 10 MEQ/100ML IV SOLN
10.0000 meq | INTRAVENOUS | Status: AC
  Administered 2020-10-18 (×4): 10 meq via INTRAVENOUS
  Filled 2020-10-18 (×2): qty 100

## 2020-10-18 MED ORDER — GABAPENTIN 400 MG PO CAPS
800.0000 mg | ORAL_CAPSULE | Freq: Two times a day (BID) | ORAL | Status: DC | PRN
  Administered 2020-10-23 – 2020-10-28 (×2): 800 mg via ORAL
  Filled 2020-10-18 (×4): qty 2

## 2020-10-18 MED ORDER — SERTRALINE HCL 50 MG PO TABS
50.0000 mg | ORAL_TABLET | Freq: Every day | ORAL | Status: DC
  Administered 2020-10-18 – 2020-10-29 (×12): 50 mg via ORAL
  Filled 2020-10-18 (×12): qty 1

## 2020-10-18 MED ORDER — ROSUVASTATIN CALCIUM 10 MG PO TABS
20.0000 mg | ORAL_TABLET | Freq: Every morning | ORAL | Status: DC
  Administered 2020-10-18 – 2020-10-29 (×11): 20 mg via ORAL
  Filled 2020-10-18 (×2): qty 1
  Filled 2020-10-18: qty 2
  Filled 2020-10-18 (×2): qty 1
  Filled 2020-10-18: qty 2
  Filled 2020-10-18 (×4): qty 1

## 2020-10-18 MED ORDER — HYDROCORTISONE NA SUCCINATE PF 100 MG IJ SOLR
50.0000 mg | Freq: Four times a day (QID) | INTRAMUSCULAR | Status: DC
  Administered 2020-10-18 – 2020-10-20 (×8): 50 mg via INTRAVENOUS
  Filled 2020-10-18 (×8): qty 2

## 2020-10-18 MED ORDER — VANCOMYCIN HCL 1000 MG/200ML IV SOLN
1000.0000 mg | Freq: Once | INTRAVENOUS | Status: AC
  Administered 2020-10-18: 1000 mg via INTRAVENOUS
  Filled 2020-10-18: qty 200

## 2020-10-18 MED ORDER — VANCOMYCIN HCL 750 MG/150ML IV SOLN
750.0000 mg | INTRAVENOUS | Status: DC
  Filled 2020-10-18: qty 150

## 2020-10-18 MED ORDER — TOPIRAMATE 100 MG PO TABS
100.0000 mg | ORAL_TABLET | Freq: Every day | ORAL | Status: DC
  Administered 2020-10-18 – 2020-10-24 (×7): 100 mg via ORAL
  Filled 2020-10-18 (×7): qty 1

## 2020-10-18 MED ORDER — DEXTROSE IN LACTATED RINGERS 5 % IV SOLN: INTRAVENOUS | Status: DC

## 2020-10-18 NOTE — Evaluation (Signed)
Physical Therapy Evaluation Patient Details Name: [name] MRN: [id number] DOB: 01/31/1956 Today's Date: 10/18/2020   History of Present Illness  66 y.o. female with PMH of arthritis, cardiomyopathy, chronic back pain, COPD, chronic narcotic use, chronic knee pains, chronic systolic congestive heart failure, dual ICD, hyperlipidemia, hypertension, GERD, major depressive disorder, PTSD, recurrent incisional hernia repair status post flap repair 03/15/2015 . Presenting with abdominal pain x3 weeks. 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  Clinical Impression  The patient resting in bed, reports abdominal pain when moving. Patient's BP's : supine 120/65, sitting 84/51(55), ost standing 86/51(65), back to supine 112/50(65). Patient on RA with SPO2 100%, HR in 80's.  Patient has a care provider  ~ 6hours per day that assists with household chores.and her  fiance' home in evenings. Patient is independennt in  ambulation without AD and basic ADL's.   Pt admitted with above diagnosis.  Pt currently with functional limitations due to the deficits listed below (see PT Problem List). Pt will benefit from skilled PT to increase their independence and safety with mobility to allow discharge to the venue listed below.       Follow Up Recommendations Home health PT    Equipment Recommendations  None recommended by PT    Recommendations for Other Services       Precautions / Restrictions Precautions Precautions: Fall Precaution Comments: monitor BP, on low dose levophed Restrictions Weight Bearing Restrictions: No      Mobility  Bed Mobility Overal bed mobility: Needs Assistance Bed Mobility: Rolling;Sidelying to Sit;Sit to Supine Rolling: Min assist Sidelying to sit: Min assist;HOB elevated   Sit to supine: Min guard   General bed mobility comments: educate patient in log roll technique due to abdominal pain with cues and min A to complete.  pt initiated laying herself back onto bed without using technique with noticeable facial grimace    Transfers Overall transfer level: Needs assistance Equipment used: 2 person hand held assist Transfers: Sit to/from Stand Sit to Stand: Min assist;+2 safety/equipment         General transfer comment: side  steps along bed x 4, required UE support  Ambulation/Gait                Stairs            Wheelchair Mobility    Modified Rankin (Stroke Patients Only)       Balance Overall balance assessment: Mild deficits observed, not formally tested                                           Pertinent Vitals/Pain Pain Assessment: Faces Faces Pain Scale: Hurts even more Pain Location: abdomen Pain Descriptors / Indicators: Sore Pain Intervention(s): Monitored during session;Premedicated before session;Limited activity within patient's tolerance;Repositioned    Home Living Family/patient expects to be discharged to:: Private residence Living Arrangements: Spouse/significant other Available Help at Discharge: Available PRN/intermittently Type of Home: House Home Access: Ramped entrance       Home Equipment: Walker - 2 wheels Additional Comments: O2 at noight.    Prior Function Level of Independence: Needs assistance   Gait / Transfers Assistance Needed: does not use AD  ADL's / Homemaking Assistance Needed: has CNA from 10-5pm that assists with "whatever I need" for IADLs, pt reports typically I with BADLs        Hand  Dominance   Dominant Hand:  (did not specify)    Extremity/Trunk Assessment   Upper Extremity Assessment Upper Extremity Assessment: Overall WFL for tasks assessed    Lower Extremity Assessment Lower Extremity Assessment: Generalized weakness    Cervical / Trunk Assessment Cervical / Trunk Assessment: Normal  Communication   Communication: No difficulties  Cognition Arousal/Alertness: Awake/alert Behavior  During Therapy: WFL for tasks assessed/performed Overall Cognitive Status: Within Functional Limits for tasks assessed                                        General Comments      Exercises     Assessment/Plan    PT Assessment Patient needs continued PT services  PT Problem List Decreased strength;Decreased activity tolerance;Decreased mobility;Cardiopulmonary status limiting activity;Pain       PT Treatment Interventions DME instruction;Therapeutic activities;Gait training;Therapeutic exercise;Functional mobility training;Patient/family education    PT Goals (Current goals can be found in the Care Plan section)  Acute Rehab PT Goals Patient Stated Goal: less pain PT Goal Formulation: With patient Time For Goal Achievement: 11/01/20 Potential to Achieve Goals: Good    Frequency Min 3X/week   Barriers to discharge        Co-evaluation PT/OT/SLP Co-Evaluation/Treatment: Yes Reason for Co-Treatment: For patient/therapist safety PT goals addressed during session: Mobility/safety with mobility OT goals addressed during session: ADL's and self-care       AM-PAC PT "6 Clicks" Mobility  Outcome Measure Help needed turning from your back to your side while in a flat bed without using bedrails?: A Little Help needed moving from lying on your back to sitting on the side of a flat bed without using bedrails?: A Little Help needed moving to and from a bed to a chair (including a wheelchair)?: A Little Help needed standing up from a chair using your arms (e.g., wheelchair or bedside chair)?: A Little Help needed to walk in hospital room?: A Lot Help needed climbing 3-5 steps with a railing? : A Lot 6 Click Score: 16    End of Session   Activity Tolerance: Patient limited by pain;Patient limited by fatigue;Treatment limited secondary to medical complications (Comment) Patient left: in bed;with call bell/phone within reach Nurse Communication: Mobility  status PT Visit Diagnosis: Unsteadiness on feet (R26.81);Pain    Time: 8372-9021 PT Time Calculation (min) (ACUTE ONLY): 23 min   Charges:   PT Evaluation $PT Eval Low Complexity: [facility] Acute Rehabilitation Services Pager [phone] Office [phone]   Claretha Cooper 10/18/2020, 12:37 PM

## 2020-10-18 NOTE — Consult Note (Signed)
Referring Provider: [facility] Primary Care Physician:  Lillard Anes, MD Primary Gastroenterologist:  Althia Forts (Previously saw Dr. Lyndel Safe in [place])  Reason for Consultation:  Diverticulitis with colo-colonic fistulization  HPI: [name] is a 65 y.o. female with extensive past medical history noted below to include CHF with ICD, prior small bowel resection in 2011 (due to ileus), appendectomy, and hernia repair with mesh presenting for consultation of diverticulitis with colo-colonic fistulization.  Patient presented to the ED yesterday after CT scan ordered by PCP revealed diverticulitis with colocolonic visualization.  She reports left lower quadrant abdominal pain for the past 3 weeks.  She also reports intermittent fevers, as high as 102F.  She has been having intermittent nausea and vomiting, as well as diarrhea, though she typically has only been having 2 stools per week.  States she had a scant blood in the stool on one occasion.  Denies melena.  Reports poor appetite over the past several weeks but denies any recent unexplained weight loss.  Denies dysphagia.  Patient takes ASA daily but denies blood thinner use.  Colonoscopy in 2017: TI was reached.  Normal appearing mucosa throughout.  Pancolonic diverticulosis, more prominent in sigmoid but diverticulosis visualized throughout entire colon. Small internal hemorrhoids.  Of note, patient reports she is always hypotensive with SBP 80s-90s at baseline.  Past Medical History:  Diagnosis Date  . Admission for long-term opiate analgesic use 10/24/2019  . Arthritis of right hip 05/29/2016   Formatting of this note might be different from the original. Added automatically from request for surgery [id number]  . BMI 26.0-26.9,adult 03/29/2020  . Cardiomyopathy [facility])    Overview:  Ejection fraction 45% in 2015 Ejection fraction 30 to 35% in November 2018  . Chronic back pain   . Chronic hypoxemic respiratory failure ([place]) 10/24/2019  .  Chronic narcotic use 03/23/2015  . Chronic pain of both knees 12/21/2018   Added automatically from request for surgery [id number]  Formatting of this note might be different from the original. Added automatically from request for surgery [id number]  . Chronic systolic congestive heart failure, NYHA class 2 ([place]) 06/19/2017  . COPD (chronic obstructive pulmonary disease) ([place])   . Coronary artery disease involving native coronary artery of native heart without angina pectoris 05/31/2015   Overview:  Abnormal stress test in fall of 2016, cardiac catheterization showed normal coronaries.  . Cystitis 05/17/2020  . Dehydration 05/17/2020  . Diarrhea 05/17/2020  . Dilated cardiomyopathy ([place]) 06/19/2017  . 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 Dyslipidemia 05/31/2015  . Essential hypertension 05/31/2015  . GERD (gastroesophageal reflux disease)   . 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[place]) 07/13/2020  . Hypotension 05/17/2020  . ICD (implantable cardiac defibrillator) in place 2012  . 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 Ileus following gastrointestinal surgery ([place]) 03/26/2015  . Major depressive disorder, single episode, moderate ([place]) 10/24/2019  . Migraine without aura with status migrainosus 10/24/2019  . Nausea alone 04/19/2014  . Obstructive chronic bronchitis with exacerbation ([place]) 10/25/2019  . Other spondylosis with radiculopathy, lumbar region 10/24/2019  . Persistent vomiting 05/17/2020  . Presence of left artificial hip joint 10/24/2019  . PTSD (post-traumatic stress disorder)   . 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 Senile osteoporosis 10/24/2019  . Thyroid disease     Past Surgical History:  Procedure Laterality Date  . APPENDECTOMY    . CARDIAC CATHETERIZATION    . CARPAL TUNNEL  RELEASE    . CESAREAN SECTION    . CHOLECYSTECTOMY    . CORONARY  ANGIOPLASTY    . HAND SURGERY    . ICD GENERATOR CHANGEOUT N/A 05/21/2019   Procedure: ICD GENERATOR CHANGEOUT;  Surgeon: Constance Haw, MD;  Location: [place] CV LAB;  Service: Cardiovascular;  Laterality: N/A;  . ICD IMPLANT     [employer]  . intestinal blockage 2011    . LAPAROSCOPIC ASSISTED VENTRAL HERNIA REPAIR N/A 03/23/2015   Procedure: LAPAROSCOPIC VENTRAL WALL HERNIA REPAIR;  Surgeon: Michael Boston, MD;  Location: [facility] ORS;  Service: General;  Laterality: N/A;  With MESH  . LAPAROSCOPIC LYSIS OF ADHESIONS N/A 03/23/2015   Procedure: LAPAROSCOPIC LYSIS OF ADHESIONS;  Surgeon: Michael Boston, MD;  Location: [facility] ORS;  Service: General;  Laterality: N/A;  . NECK SURGERY     fused  . TONSILLECTOMY    . ULNAR NERVE TRANSPOSITION  01/23/2012   Procedure: ULNAR NERVE DECOMPRESSION/TRANSPOSITION;  Surgeon: Ophelia Charter, MD;  Location: [place] [facility] OR[facility];  Service: Neurosurgery;  Laterality: Left;  LEFT ulnar nerve decompression    Prior to Admission medications   Medication Sig Start Date End Date Taking? Authorizing Provider  aspirin 81 MG tablet Take 81 mg by mouth daily.   Yes [provider]  Buprenorphine HCl (BELBUCA) 900 MCG FILM Place 900 mcg inside cheek every 12 (twelve) hours.   Yes [provider]  carvedilol (COREG) 3.125 MG tablet TAKE 1 TABLET BY MOUTH TWICE DAILY 10/04/20  Yes Lillard Anes, MD  celecoxib (CELEBREX) 200 MG capsule TAKE ONE CAPSULE BY MOUTH EVERY MORNING 06/15/20  Yes Lillard Anes, MD  cyclobenzaprine (FLEXERIL) 10 MG tablet Take 10 mg by mouth 3 (three) times daily as needed for muscle spasms.   Yes [provider]  diazepam (VALIUM) 5 MG tablet Take 1 tablet (5 mg total) by mouth 3 (three) times daily as needed. Patient taking differently: Take 5 mg by mouth 3 (three) times daily as needed for anxiety. 07/27/20  Yes Lillard Anes, MD  ENTRESTO 24-26 MG TAKE ONE TABLET BY MOUTH EVERY MORNING and TAKE ONE  TABLET BY MOUTH EVERY EVENING 06/15/20  Yes Lillard Anes, MD  esomeprazole (NEXIUM) 20 MG capsule Take 1 capsule (20 mg total) by mouth daily. 10/02/20  Yes Lillard Anes, MD  fludrocortisone (FLORINEF) 0.1 MG tablet TAKE 1 TABLET BY MOUTH AS DIRECTED. Patient taking differently: Take 0.1 mg by mouth daily. 10/12/20  Yes Lillard Anes, MD  fluticasone furoate-vilanterol (BREO ELLIPTA) 100-25 MCG/INH AEPB Inhale 1 puff into the lungs daily in the afternoon. 10/03/20  Yes Lillard Anes, MD  Oxycodone HCl 10 MG TABS Take 1 tablet by mouth every 4 (four) hours as needed (pain).   Yes [provider]  ranolazine (RANEXA) 500 MG 12 hr tablet TAKE 1 TABLET(500 MG) BY MOUTH TWICE DAILY 10/04/20  Yes Park Liter, MD  rosuvastatin (CRESTOR) 20 MG tablet TAKE ONE TABLET BY MOUTH EVERY MORNING 09/27/20  Yes Lillard Anes, MD  sertraline (ZOLOFT) 100 MG tablet Take 0.5 tablets (50 mg total) by mouth daily. 08/11/20  Yes Lillard Anes, MD  tiZANidine (ZANAFLEX) 4 MG tablet Take 4 mg by mouth at bedtime.   Yes [provider]  topiramate (TOPAMAX) 50 MG tablet Take 2 tablets (100 mg total) by mouth at bedtime. 01/21/20  Yes Lillard Anes, MD  Ubrogepant (UBRELVY) 100 MG TABS Take 1 tablet by mouth as  needed (May repeat 1 tablet after 2 hours if needed.  Maximum 2 tablets in 24 hours). Patient taking differently: Take 1 tablet by mouth every 2 (two) hours as needed (May repeat 1 tablet after 2 hours if needed.  Maximum 2 tablets in 24 hours). 01/21/20  Yes Lillard Anes, MD  denosumab [facility]) 60 MG/ML SOSY injection Inject 60 mg into the skin every 6 (six) months. 02/14/20   Lillard Anes, MD  diphenoxylate-atropine (LOMOTIL) 2.5-0.025 MG tablet Take 1 tablet by mouth 4 (four) times daily as needed for diarrhea or loose stools. 05/19/20   Lillard Anes, MD  EPINEPHRINE 0.3 mg/0.3 mL IJ SOAJ injection Inject 0.3  mLs (0.3 mg total) into the muscle as needed for anaphylaxis. 04/27/20   Lillard Anes, MD  fluticasone [facility]) 50 MCG/ACT nasal spray Place 2 sprays into both nostrils daily as needed (congestion). 01/21/20   Lillard Anes, MD  furosemide (LASIX) 40 MG tablet TAKE ONE TABLET BY MOUTH EVERY MORNING MAY take 0.5 tablet in THE afternoon IF needed Patient taking differently: Take 20 mg by mouth daily as needed for fluid. 06/15/20   Lillard Anes, MD  gabapentin (NEURONTIN) 800 MG tablet Take 1 tablet (800 mg total) by mouth 3 (three) times daily. Patient usually takes twice a day Patient taking differently: Take 800 mg by mouth 2 (two) times daily as needed (pain). 01/21/20   Lillard Anes, MD  ipratropium (ATROVENT) 0.02 % nebulizer solution Take 2.5 mLs (0.5 mg total) by nebulization every 4 (four) hours as needed for wheezing or shortness of breath. 01/21/20   Lillard Anes, MD  levocetirizine (XYZAL) 5 MG tablet Take 5 mg by mouth daily as needed for allergies. 12/13/19   [provider]  LINZESS 145 MCG CAPS capsule Take 145 mcg by mouth daily as needed (indigestion). 08/22/20   [provider]  nitroGLYCERIN (NITROSTAT) 0.4 MG SL tablet Place 1 tablet (0.4 mg total) under the tongue every 5 (five) minutes as needed for chest pain. 01/21/20   Lillard Anes, MD  ondansetron (ZOFRAN) 4 MG tablet Take 1 tablet (4 mg total) by mouth every 8 (eight) hours as needed for nausea or vomiting. 05/17/20   Lillard Anes, MD    Scheduled Meds: . celecoxib  200 mg Oral q morning  . Chlorhexidine Gluconate Cloth  6 each Topical Daily  . fluticasone furoate-vilanterol  1 puff Inhalation Q1500  . heparin  5,000 Units Subcutaneous Q8H  . hydrocortisone sod succinate (SOLU-CORTEF) inj  50 mg Intravenous Q6H  . mouth rinse  15 mL Mouth Rinse BID  . sodium chloride flush  3 mL Intravenous Q12H  . sodium chloride flush  3 mL Intravenous Q12H    Continuous Infusions: . sodium chloride    . sodium chloride Stopped (10/17/20 2313)  . ceFEPime (MAXIPIME) IV Stopped (10/18/20 0533)  . dextrose 5% lactated ringers    . metronidazole Stopped (10/18/20 0921)  . norepinephrine (LEVOPHED) Adult infusion 2 mcg/min (10/18/20 1000)   PRN Meds:.sodium chloride, acetaminophen **OR** acetaminophen, diazepam, HYDROmorphone (DILAUDID) injection, ipratropium, magnesium citrate, nitroGLYCERIN, ondansetron **OR** ondansetron (ZOFRAN) IV, oxyCODONE, sodium chloride flush  Allergies as of 10/17/2020  . (No Known Allergies)    Family History  Problem Relation Age of Onset  . Heart attack Other   . Cancer Other   . Heart failure Other   . Hypertension Mother   . Heart attack Mother   . Alcohol abuse Father   .  Hypertension Father   . Heart attack Father   . Anesthesia problems Neg Hx   . Hypotension Neg Hx   . Malignant hyperthermia Neg Hx   . Pseudochol deficiency Neg Hx     Social History   Socioeconomic History  . Marital status: Single    Spouse name: Not on file  . Number of children: 3  . Years of education: Not on file  . Highest education level: Not on file  Occupational History  . Occupation: disabled  Tobacco Use  . Smoking status: Former Smoker    Packs/day: 0.50    Years: 30.00    Pack years: 15.00    Types: Cigarettes    Quit date: 03/2020    Years since quitting: 0.5  . Smokeless tobacco: Never Used  Vaping Use  . Vaping Use: Never used  Substance and Sexual Activity  . Alcohol use: Yes    Alcohol/week: 2.0 standard drinks    Types: 1 Cans of beer, 1 Standard drinks or equivalent per week    Comment: seldom  . Drug use: No  . Sexual activity: Not Currently  Other Topics Concern  . Not on file  Social History Narrative   One level home with boyfriend   Caffeine - coffee 1-2 cups/day; Green tea 4-5 bottles a day   Exercise - some    Right handed      Social Determinants of Health   Financial  Resource Strain: Not on file  Food Insecurity: Not on file  Transportation Needs: Not on file  Physical Activity: Not on file  Stress: Not on file  Social Connections: Not on file  Intimate Partner Violence: Not on file    Review of Systems: Review of Systems  Constitutional: Positive for fever and malaise/fatigue.  HENT: Negative for hearing loss and tinnitus.   Eyes: Negative for pain and redness.  Respiratory: Negative for cough and shortness of breath.   Cardiovascular: Negative for chest pain and palpitations.  Gastrointestinal: Positive for abdominal pain, blood in stool, diarrhea, nausea and vomiting. Negative for constipation, heartburn and melena.  Genitourinary: Negative for flank pain and hematuria.  Musculoskeletal: Negative for falls and joint pain.  Skin: Negative for itching and rash.  Neurological: Negative for seizures and loss of consciousness.  Endo/Heme/Allergies: Negative for polydipsia. Does not bruise/bleed easily.  Psychiatric/Behavioral: Negative for substance abuse. The patient is not nervous/anxious.     Physical Exam: Vital signs: Vitals:   10/18/20 0800 10/18/20 0900  BP: (!) 119/57 (!) 91/51  Pulse:  77  Resp: 13 20  Temp: 98 F (36.7 C)   SpO2: 100% 100%   Last BM Date:  (PTA)  Physical Exam Vitals reviewed.  Constitutional:      General: She is not in acute distress. HENT:     Head: Normocephalic and atraumatic.     Nose: Nose normal. No congestion.     Mouth/Throat:     Mouth: Mucous membranes are moist.     Pharynx: Oropharynx is clear.  Eyes:     Extraocular Movements: Extraocular movements intact.     Conjunctiva/sclera: Conjunctivae normal.  Cardiovascular:     Rate and Rhythm: Normal rate and regular rhythm.     Pulses: Normal pulses.  Pulmonary:     Effort: Pulmonary effort is normal. No respiratory distress.     Breath sounds: Normal breath sounds.  Abdominal:     General: Bowel sounds are normal. There is no distension.      Palpations:  Abdomen is soft. There is no mass.     Tenderness: There is abdominal tenderness (moderate LLQ). There is no guarding or rebound.     Hernia: No hernia is present.  Musculoskeletal:        General: No swelling or tenderness.     Cervical back: Normal range of motion and neck supple.  Skin:    General: Skin is warm and dry.  Neurological:     General: No focal deficit present.     Mental Status: She is alert and oriented to person, place, and time.  Psychiatric:        Mood and Affect: Mood normal.        Behavior: Behavior normal. Behavior is cooperative.     GI:  Lab Results: Recent Labs    10/17/20 1322 10/18/20 0252  WBC 15.3* 13.2*  HGB 10.4* 9.6*  HCT 31.7* 30.0*  PLT 402* 389   BMET Recent Labs    10/17/20 1322 10/18/20 0252  NA 139 142  K 3.8 3.1*  CL 105 114*  CO2 22 18*  GLUCOSE 113* 148*  BUN 31* 23  CREATININE 1.47* 1.09*  CALCIUM 8.8* 7.4*   LFT Recent Labs    10/17/20 1322  PROT 6.6  ALBUMIN 3.1*  AST 14*  ALT 11  ALKPHOS 74  BILITOT 0.7   PT/INR Recent Labs    10/18/20 0252  LABPROT 12.9  INR 1.0    Studies/Results: No results found.  Impression: Diverticulitis with colo-colonic fistula: CT 10/16/20 showed abnormal configuration of the distal descending colon and proximal sigmoid, which appears to involve multiple colo-colonic fistulas and adhesion of adjacent loops. There is wall thickening and adjacent fat stranding in this vicinity -WBCs 13.2, decreased from 15.3 yesterday -Hgb 9.6, decreased from 10.4 yesterday, though only scant BRBPR  Plan: Continue antibiotics.  Further management by primary team.  No current role for colonoscopy, given active inflammation, presumably from diverticulitis.  IBD unlikely, though to rule it out, fecal cal, CRP/ESR, and ANCA titers ordered.  Further management by surgical team.    [facility] will follow at a distance.  Please recall [place] sooner if needed.   LOS: 1 day    Salley Slaughter  PA-C 10/18/2020, 10:50 AM  Contact #  [phone]

## 2020-10-18 NOTE — Evaluation (Signed)
Occupational Therapy Evaluation Patient Details Name: [name] MRN: [id number] DOB: Jul 21, 1956 Today's Date: 10/18/2020    History of Present Illness 65 y.o. female with PMH of arthritis, cardiomyopathy, chronic back pain, COPD, chronic narcotic use, chronic knee pains, chronic systolic congestive heart failure, dual ICD, hyperlipidemia, hypertension, GERD, major depressive disorder, PTSD, recurrent incisional hernia repair status post flap repair 03/15/2015 . Presenting with abdominal pain x3 weeks. 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   Clinical Impression   Patient lives at home with fiancee, has ramp entry. At baseline patient is I with BADLs, has CNA from 10-5pm to assist with IADLs cooking, cleaning. Pt denies use of AD for ambulation. Currently patient with abdominal pain impacting her activity tolerance necessary for ADL tasks. Instructed pt in log roll technique to minimize abdominal pain, needing min A. Pt reports mild dizziness with sitting, then states her BP is always low. Pt min A x2 for safety to stand and side step to [facility]. Recommend continued acute OT services to maximize patient endurance, balance, safety awareness in order to facilitate D/C home with family and [facility] services. Would recommend initial 24/7 support at home while patient rebuilding activity tolerance.  Resting BP 99/55 Sitting 84/51 (55) After standing 86/51 (65) Return to supine 112/50 O2 maintained on RA throughout session high 90s to 100%     Follow Up Recommendations  Home health OT;Supervision/Assistance - 24 hour;Other (comment) (24/7 initially)    Equipment Recommendations  Tub/shower seat       Precautions / Restrictions Precautions Precautions: Fall Precaution Comments: monitor BP, on low dose levophed Restrictions Weight Bearing Restrictions: No      Mobility Bed Mobility Overal bed mobility: Needs Assistance Bed Mobility:  Rolling;Sidelying to Sit;Sit to Supine Rolling: Min assist Sidelying to sit: Min assist;HOB elevated   Sit to supine: Min guard   General bed mobility comments: educate patient in log roll technique due to abdominal pain with cues and min A to complete. pt initiated laying herself back onto bed without using technique with noticeable facial grimace    Transfers Overall transfer level: Needs assistance Equipment used: 2 person hand held assist Transfers: Sit to/from Stand Sit to Stand: Min assist;+2 safety/equipment         General transfer comment: please see toilet transfer in ADL section    Balance Overall balance assessment: Mild deficits observed, not formally tested                                         ADL either performed or assessed with clinical judgement   ADL Overall ADL's : Needs assistance/impaired     Grooming: Set up;Sitting   Upper Body Bathing: Set up;Sitting   Lower Body Bathing: Moderate assistance;Sitting/lateral leans;Sit to/from stand   Upper Body Dressing : Set up;Sitting   Lower Body Dressing: Moderate assistance;Sitting/lateral leans;Sit to/from stand Lower Body Dressing Details (indicate cue type and reason): d/t abdominal pain Toilet Transfer: Minimal assistance;+2 for safety/equipment;Ambulation Toilet Transfer Details (indicate cue type and reason): patient able to complete sit to stand from EOB with initial minor loss of balance, with min x2 for safety pt side step to [place] Manipulation and Hygiene: Minimal assistance;Sit to/from stand       Functional mobility during ADLs: Minimal assistance;+2 for safety/equipment General ADL Comments: patient requiring increased assistance with self care tasks due to decreased activity  tolerance, balance and pain                  Pertinent Vitals/Pain Pain Assessment: Faces Faces Pain Scale: Hurts even more Pain Location: abdomen Pain Descriptors /  Indicators: Sore Pain Intervention(s): Premedicated before session     Hand Dominance  (did not specify)   Extremity/Trunk Assessment Upper Extremity Assessment Upper Extremity Assessment: Overall WFL for tasks assessed   Lower Extremity Assessment Lower Extremity Assessment: Defer to PT evaluation       Communication Communication Communication: No difficulties   Cognition Arousal/Alertness: Awake/alert Behavior During Therapy: WFL for tasks assessed/performed Overall Cognitive Status: Within Functional Limits for tasks assessed                                                Home Living Family/patient expects to be discharged to:: Private residence Living Arrangements: Spouse/significant other Available Help at Discharge: Available PRN/intermittently Type of Home: House Home Access: Ramped entrance           Bathroom Shower/Tub: Tub only;Curtain   Bathroom Toilet: Standard     Home Equipment: Walker - 2 wheels   Additional Comments: O2 at noight.      Prior Functioning/Environment Level of Independence: Needs assistance  Gait / Transfers Assistance Needed: does not use AD ADL's / Homemaking Assistance Needed: has CNA from 10-5pm that assists with "whatever I need" for IADLs, pt reports typically I with BADLs            OT Problem List: Decreased activity tolerance;Impaired balance (sitting and/or standing);Pain      OT Treatment/Interventions: Self-care/ADL training;Energy conservation;DME and/or AE instruction;Therapeutic activities;Patient/family education;Balance training    OT Goals(Current goals can be found in the care plan section) Acute Rehab OT Goals Patient Stated Goal: less pain OT Goal Formulation: With patient Time For Goal Achievement: 11/01/20 Potential to Achieve Goals: Good  OT Frequency: Min 2X/week    AM-PAC OT "6 Clicks" Daily Activity     Outcome Measure Help from another person eating meals?: None Help  from another person taking care of personal grooming?: A Little Help from another person toileting, which includes using toliet, bedpan, or urinal?: A Little Help from another person bathing (including washing, rinsing, drying)?: A Lot Help from another person to put on and taking off regular upper body clothing?: A Little Help from another person to put on and taking off regular lower body clothing?: A Lot 6 Click Score: 17   End of Session Nurse Communication: Mobility status  Activity Tolerance: Patient tolerated treatment well Patient left: in bed;with call bell/phone within reach  OT Visit Diagnosis: Unsteadiness on feet (R26.81);Pain Pain - part of body:  (abdomen)                Time: 1610-9604 OT Time Calculation (min): 23 min Charges:  OT General Charges $OT Visit: 1 Visit OT Evaluation $OT Eval Low Complexity: [place] OT OT pager: [place] 10/18/2020, 12:16 PM

## 2020-10-18 NOTE — TOC Initial Note (Signed)
Transition of Care [facility]) - Initial/Assessment Note    Patient Details  Name: [name] MRN: [id number] Date of Birth: 1956/05/07  Transition of Care [facility]) CM/SW Contact:    Leeroy Cha, RN Phone Number: 10/18/2020, 8:41 AM  Clinical Narrative:                 65 year old female admitted to the medical service with signs and symptoms of sepsis due to underlying acute diverticulitis with complications.  Patient has a complex past abdominal surgical history.  Patient had undergone laparotomy in 2011 in [place].  She apparently had colonic obstruction although she is uncertain as to the cause.  Patient later was seen by our practice, Dr. Neysa Bonito, for incisional hernia repair in 2016.  Over the past 3 weeks the patient has developed progressive abdominal pain and loss of appetite.  Today she underwent a CT scan in [place], [place].  She was found to have evidence of acute diverticulitis with colocolonic fistulae.  White blood cell count is elevated at 15,000.  Patient was seen and evaluated in our emergency department.  She is being admitted to the medical service.  She has had 2 episodes of hypotension in the emergency department and will likely be admitted to a stepdown bed or ICU bed for hemodynamic monitoring overnight PLAN_ to return to home with self care.does have a sig. Other as contact and support Expected Discharge Plan: Home/Self Care Barriers to Discharge: Continued Medical Work up   Patient Goals and CMS Choice Patient states their goals for this hospitalization and ongoing recovery are:: to go back home CMS Medicare.gov Compare Post Acute Care list provided to:: Patient    Expected Discharge Plan and Services Expected Discharge Plan: Home/Self Care   Discharge Planning Services: CM Consult   Living arrangements for the past 2 months: Single Family Home                                      Prior Living  Arrangements/Services Living arrangements for the past 2 months: Single Family Home Lives with:: Self Patient language and need for interpreter reviewed:: Yes Do you feel safe going back to the place where you live?: Yes      Need for Family Participation in Patient Care: Yes (Comment) Care giver support system in place?: Yes (comment)   Criminal Activity/Legal Involvement Pertinent to Current Situation/Hospitalization: No - Comment as needed  Activities of Daily Living Home Assistive Devices/Equipment: None ADL Screening (condition at time of admission) Patient's cognitive ability adequate to safely complete daily activities?: Yes Is the patient deaf or have difficulty hearing?: No Does the patient have difficulty seeing, even when wearing glasses/contacts?: No Does the patient have difficulty concentrating, remembering, or making decisions?: No Patient able to express need for assistance with ADLs?: Yes Does the patient have difficulty dressing or bathing?: No Independently performs ADLs?: Yes (appropriate for developmental age) Does the patient have difficulty walking or climbing stairs?: Yes Weakness of Legs: Both Weakness of Arms/Hands: Both  Permission Sought/Granted                  Emotional Assessment Appearance:: Appears stated age Attitude/Demeanor/Rapport: Engaged Affect (typically observed): Calm Orientation: : Oriented to Self,Oriented to Place,Oriented to  Time,Oriented to Situation Alcohol / Substance Use: Not Applicable Psych Involvement: No (comment)  Admission diagnosis:  Diverticulitis [K57.92] Abdominal pain [R10.9] Patient Active Problem  List   Diagnosis Date Noted   Abdominal pain 10/17/2020   Sepsis ([place]) 10/17/2020   Acute diverticulitis 10/17/2020   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   Mesenteric ischemia ([place]) 10/16/2020   Thyroid disease    PTSD (post-traumatic stress disorder)    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[place]) 07/13/2020   Dehydration  05/17/2020   Hypotension 05/17/2020   Persistent vomiting 05/17/2020   Diarrhea 05/17/2020   Cystitis 05/17/2020   BMI 26.0-26.9,adult 03/29/2020   Obstructive chronic bronchitis with exacerbation ([place]) 10/25/2019   Admission for long-term opiate analgesic use 10/24/2019   Presence of left artificial hip joint 10/24/2019   Senile osteoporosis 10/24/2019   Chronic hypoxemic respiratory failure ([place]) 10/24/2019   Other spondylosis with radiculopathy, lumbar region 10/24/2019   Major depressive disorder, single episode, moderate ([place]) 10/24/2019   Migraine without aura with status migrainosus 10/24/2019   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   Chronic pain of both knees 43/60/6770   Chronic systolic congestive heart failure, NYHA class 2 ([place]) 06/19/2017   Dilated cardiomyopathy ([place]) 06/19/2017   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   Arthritis of right hip 05/29/2016   Coronary artery disease involving native coronary artery of native heart without angina pectoris 05/31/2015   Dyslipidemia 05/31/2015   Essential hypertension 05/31/2015   Chronic narcotic use 03/23/2015   GERD (gastroesophageal reflux disease)    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 03/23/2015 04/19/2014   COPD (chronic obstructive pulmonary disease) ([place])    Cardiomyopathy ([place])    Chronic back pain    PCP:  Lillard Anes, MD Pharmacy:   [facility] - [place], [place] - [address] [place] [address] Phone: [phone] Fax: [place], [place] - [place] [address] [address] Phone: [phone] Fax: [phone]     Social Determinants of Health (SDOH) Interventions     Readmission Risk Interventions No flowsheet data found.

## 2020-10-18 NOTE — Progress Notes (Signed)
PROGRESS NOTE    [name]  WFU:[id number] DOB: 07/01/1956 DOA: 10/17/2020 PCP: Lillard Anes, MD     Brief Narrative:  [name] is a 65 y.o. female with medical history significant of arthritis, cardiomyopathy, chronic back pain, COPD, chronic narcotic use, chronic knee pains, chronic systolic congestive heart failure, dual ICD, hyperlipidemia, hypertension, GERD, major depressive disorder, PTSD, recurrent incisional hernia repair status post flap repair 03/15/2015 who presented with abdominal pain x3 weeks intermittent, associated with intermittent nausea but no vomiting. She was seen by PCP in [place] 10/16/20 and return for complete abdominal CT reading "Abnormal posterior, proximal sigmoid multiple colo-- colonic fistula and adhesion adjacent to a loop, wall thickening, consistent with acute diverticulitis or other infectious or inflammatory colitis."  She was started on empiric IV antibiotics and admitted to the hospital.  She remained hypotensive and was transferred to ICU for pressors.  PCCM and general surgery consulted.  New events last 24 hours / Subjective: Patient continues to have lower abdominal pain.  Assessment & Plan:   Principal Problem:   Sepsis ([place]) Active Problems:   COPD (chronic obstructive pulmonary disease) (HCC)   Cardiomyopathy (HCC)   Chronic back pain   GERD (gastroesophageal reflux disease)   Chronic systolic congestive heart failure, NYHA class 2 (HCC)   Dilated cardiomyopathy (HCC)   ICD (implantable cardioverter-defibrillator) in place   Dyslipidemia   Essential hypertension   Hypotension   Abdominal pain   Acute diverticulitis   Colonic fistula   Septic shock secondary to acute diverticulitis as well as colocolonic fistula -Presented with sepsis along with endorgan damage including AKI, hypotension requiring pressors -Wean pressors as able today, appreciate PCCM -Blood cultures pending -Continue empiric cefepime,  Flagyl -General surgery following, GI consulted as well -Add stress dose steroids, patient is chronically on [place]inef  Chronic systolic CHF, cardiomyopathy with biventricular ICD in place -Previous EF 35 to 40% -Continue to monitor volume status closely -Holding Coreg, Entresto, Ranexa and Lasix due to shock  AKI -Baseline creatinine 1.1  -Resolved   Hypokalemia -Replace, trend  Anxiety -Continue Valium, Zoloft, Topamax  Chronic pain -Continue oxycodone, neurontin  Hyperlipidemia -Continue Crestor    DVT prophylaxis:  heparin injection 5,000 Units Start: 10/17/20 2200 TED hose Start: 10/17/20 1757 SCDs Start: 10/17/20 1757  Code Status: Full code Family Communication: No family at bedside Disposition Plan:  Status is: Inpatient  Remains inpatient appropriate because:Hemodynamically unstable and IV treatments appropriate due to intensity of illness or inability to take PO   Dispo: The patient is from: Home              Anticipated d/c is to: Home              Anticipated d/c date is: 3 days              Patient currently is not medically stable to d/c.   Difficult to place patient No      Consultants:   PCCM  General surgery  GI  Procedures:   None   Antimicrobials:  Anti-infectives (From admission, onward)   Start     Dose/Rate Route Frequency Ordered Stop   10/18/20 0600  ceFEPIme (MAXIPIME) 2 g in sodium chloride 0.9 % 100 mL IVPB        2 g 200 mL/hr over 30 Minutes Intravenous Every 12 hours 10/17/20 1847     10/18/20 0100  metroNIDAZOLE (FLAGYL) IVPB 500 mg        500 mg  100 mL/hr over 60 Minutes Intravenous Every 8 hours 10/17/20 1802     10/17/20 1815  ceFEPIme (MAXIPIME) 2 g in sodium chloride 0.9 % 100 mL IVPB        2 g 200 mL/hr over 30 Minutes Intravenous  Once 10/17/20 1802 10/17/20 1902   10/17/20 1545  ciprofloxacin (CIPRO) IVPB 400 mg        400 mg 200 mL/hr over 60 Minutes Intravenous  Once 10/17/20 1544 10/17/20 1713    10/17/20 1545  metroNIDAZOLE (FLAGYL) IVPB 500 mg        500 mg 100 mL/hr over 60 Minutes Intravenous  Once 10/17/20 1544 10/17/20 1720        Objective: Vitals:   10/18/20 0600 10/18/20 0700 10/18/20 0800 10/18/20 0900  BP: (!) 111/59 (!) 106/55 (!) 119/57 (!) 91/51  Pulse: 74 69  77  Resp: 13 14 13 20   Temp:   98 F (36.7 C)   TempSrc:   Oral   SpO2: 100% 100% 100% 100%  Weight:      Height:        Intake/Output Summary (Last 24 hours) at 10/18/2020 1157 Last data filed at 10/18/2020 1000 Gross per 24 hour  Intake 6421.84 ml  Output 800 ml  Net 5621.84 ml   Filed Weights   10/17/20 1050 10/18/20 0347  Weight: 58.1 kg 61.1 kg    Examination: General exam: Appears calm and comfortable  Respiratory system: Clear to auscultation. Respiratory effort normal. No respiratory distress. No conversational dyspnea.  Cardiovascular system: S1 & S2 heard, RRR. No murmurs. No pedal edema. Gastrointestinal system: Abdomen is nondistended, soft and TTP lower abdomen Central nervous system: Alert and oriented. No focal neurological deficits. Speech clear.  Extremities: Symmetric in appearance  Skin: No rashes, lesions or ulcers on exposed skin  Psychiatry: Judgement and insight appear normal. Mood & affect appropriate.   Data Reviewed: I have personally reviewed following labs and imaging studies  CBC: Recent Labs  Lab 10/17/20 1322 10/18/20 0252  WBC 15.3* 13.2*  NEUTROABS 12.1*  --   HGB 10.4* 9.6*  HCT 31.7* 30.0*  MCV 92.4 94.0  PLT 402* 466   Basic Metabolic Panel: Recent Labs  Lab 10/17/20 1322 10/18/20 0252  NA 139 142  K 3.8 3.1*  CL 105 114*  CO2 22 18*  GLUCOSE 113* 148*  BUN 31* 23  CREATININE 1.47* 1.09*  CALCIUM 8.8* 7.4*   GFR: Estimated Creatinine Clearance: 41.4 mL/min (A) (by C-G formula based on SCr of 1.09 mg/dL (H)). Liver Function Tests: Recent Labs  Lab 10/17/20 1322  AST 14*  ALT 11  ALKPHOS 74  BILITOT 0.7  PROT 6.6  ALBUMIN  3.1*   No results for input(s): LIPASE, AMYLASE in the last 168 hours. No results for input(s): AMMONIA in the last 168 hours. Coagulation Profile: Recent Labs  Lab 10/18/20 0252  INR 1.0   Cardiac Enzymes: No results for input(s): CKTOTAL, CKMB, CKMBINDEX, TROPONINI in the last 168 hours. BNP (last 3 results) Recent Labs    12/02/19 1055 09/05/20 1138  PROBNP 1,044* 546*   HbA1C: No results for input(s): HGBA1C in the last 72 hours. CBG: Recent Labs  Lab 10/18/20 0723  GLUCAP 125*   Lipid Profile: No results for input(s): CHOL, HDL, LDLCALC, TRIG, CHOLHDL, LDLDIRECT in the last 72 hours. Thyroid Function Tests: No results for input(s): TSH, T4TOTAL, FREET4, T3FREE, THYROIDAB in the last 72 hours. Anemia Panel: No results for input(s): VITAMINB12, FOLATE, FERRITIN,  TIBC, IRON, RETICCTPCT in the last 72 hours. Sepsis Labs: Recent Labs  Lab 10/17/20 1322 10/17/20 2030  LATICACIDVEN 1.1 1.3    Recent Results (from the past 240 hour(s))  Culture, blood (routine x 2)     Status: None (Preliminary result)   Collection Time: 10/17/20  1:15 PM   Specimen: BLOOD RIGHT FOREARM  Result Value Ref Range Status   Specimen Description   Final    BLOOD RIGHT FOREARM Performed at [facility] [address], [place], [place] [address]    Special Requests   Final    BOTTLES DRAWN AEROBIC AND ANAEROBIC Blood Culture adequate volume Performed at [facility] [address], [place], [place] [address]    Culture  Setup Time   Final    GRAM POSITIVE COCCI IN CLUSTERS ANAEROBIC BOTTLE ONLY Organism ID to follow Performed at [facility], [place] [address], [place], [place] [address]    Culture GRAM POSITIVE COCCI  Final   Report Status PENDING  Incomplete  Culture, blood (routine x 2)     Status: None (Preliminary result)   Collection Time: 10/17/20  1:22 PM   Specimen: BLOOD  Result Value Ref Range Status   Specimen Description    Final    BLOOD SITE NOT SPECIFIED Performed at [facility], [address], [place], [place] [address]    Special Requests   Final    BOTTLES DRAWN AEROBIC AND ANAEROBIC Blood Culture results may not be optimal due to an inadequate volume of blood received in culture bottles Performed at [facility] [address], [place], [place] [address]    Culture   Final    NO GROWTH < 24 HOURS Performed at [facility] [address] [place], [place] [address]    Report Status PENDING  Incomplete  SARS CORONAVIRUS 2 (TAT 6-24 HRS) Nasopharyngeal Nasopharyngeal Swab     Status: None   Collection Time: 10/17/20  5:24 PM   Specimen: Nasopharyngeal Swab  Result Value Ref Range Status   SARS Coronavirus 2 NEGATIVE NEGATIVE Final    Comment: (NOTE) SARS-CoV-2 target nucleic acids are NOT DETECTED.  The SARS-CoV-2 RNA is generally detectable in upper and lower respiratory specimens during the acute phase of infection. Negative results do not preclude SARS-CoV-2 infection, do not rule out co-infections with other pathogens, and should not be used as the sole basis for treatment or other patient management decisions. Negative results must be combined with clinical observations, patient history, and epidemiological information. The expected result is Negative.  Fact Sheet for Patients: SugarRoll.be  Fact Sheet for Healthcare Providers: https://www.woods-mathews.com/  This test is not yet approved or cleared by the [place] FDA and  has been authorized for detection and/or diagnosis of SARS-CoV-2 by FDA under an Emergency Use Authorization (EUA). This EUA will remain  in effect (meaning this test can be used) for the duration of the COVID-19 declaration under Se ction 564(b)(1) of the Act, 21 U.S.C. section 360bbb-3(b)(1), unless the authorization is terminated or revoked sooner.  Performed at [facility], [place] [address], [place], [place] [address]   MRSA PCR Screening     Status: None   Collection Time: 10/17/20 10:46 PM   Specimen: Nasopharyngeal  Result Value Ref Range Status   MRSA by PCR NEGATIVE NEGATIVE Final    Comment:        The GeneXpert MRSA Assay (FDA approved for NASAL  specimens only), is one component of a comprehensive MRSA colonization surveillance program. It is not intended to diagnose MRSA infection nor to guide or monitor treatment for MRSA infections. Performed at [facility], [place] [address] [place], [place] [address]       Radiology Studies: No results found.    Scheduled Meds: . celecoxib  200 mg Oral q morning  . Chlorhexidine Gluconate Cloth  6 each Topical Daily  . feeding supplement  1 Container Oral TID BM  . fluticasone furoate-vilanterol  1 puff Inhalation Q1500  . heparin  5,000 Units Subcutaneous Q8H  . hydrocortisone sod succinate (SOLU-CORTEF) inj  50 mg Intravenous Q6H  . mouth rinse  15 mL Mouth Rinse BID  . sodium chloride flush  3 mL Intravenous Q12H  . sodium chloride flush  3 mL Intravenous Q12H   Continuous Infusions: . sodium chloride    . sodium chloride Stopped (10/17/20 2313)  . ceFEPime (MAXIPIME) IV Stopped (10/18/20 0533)  . dextrose 5% lactated ringers 100 mL/hr at 10/18/20 1052  . metronidazole Stopped (10/18/20 0921)  . norepinephrine (LEVOPHED) Adult infusion 2 mcg/min (10/18/20 1000)     LOS: 1 day      Time spent: 35 minutes   Dessa Phi, DO [facility] 10/18/2020, 11:57 AM   Available via Epic secure chat 7am-7pm After these hours, please refer to coverage provider listed on [email or web address]

## 2020-10-18 NOTE — Progress Notes (Signed)
Progress Note: General Surgery Service   Chief Complaint/Subjective: Pain continues across the abdomen, slightly better.  No specific spot in particular hurts, kind of all over.  Objective: Vital signs in last 24 hours: Temp:  [97.7 F (36.5 C)-98.3 F (36.8 C)] 98 F (36.7 C) (02/23 0800) Pulse Rate:  [36-103] 69 (02/23 0700) Resp:  [11-22] 13 (02/23 0800) BP: (74-132)/(45-69) 119/57 (02/23 0800) SpO2:  [97 %-100 %] 100 % (02/23 0800) Weight:  [58.1 kg-61.1 kg] 61.1 kg (02/23 0347) Last BM Date:  (pta)  Intake/Output from previous day: 02/22 0701 - 02/23 0700 In: 5894.3 [I.V.:2106.2; IV Piggyback:3788.1] Out: 800 [Urine:800] Intake/Output this shift: No intake/output data recorded.  Constitutional: NAD; conversant; no deformities Eyes: Moist conjunctiva; no lid lag; anicteric; PERRL Neck: Trachea midline; no thyromegaly Lungs: Normal respiratory effort; no tactile fremitus CV: RRR; no palpable thrills; no pitting edema GI: Abd soft, mild tenderness throughout, worse in the left lower quadrant; no palpable hepatosplenomegaly MSK: Normal range of motion of extremities; no clubbing/cyanosis Psychiatric: Appropriate affect; alert and oriented x3 Lymphatic: No palpable cervical or axillary lymphadenopathy  Lab Results: CBC  Recent Labs    10/17/20 1322 10/18/20 0252  WBC 15.3* 13.2*  HGB 10.4* 9.6*  HCT 31.7* 30.0*  PLT 402* 389   BMET Recent Labs    10/17/20 1322 10/18/20 0252  NA 139 142  K 3.8 3.1*  CL 105 114*  CO2 22 18*  GLUCOSE 113* 148*  BUN 31* 23  CREATININE 1.47* 1.09*  CALCIUM 8.8* 7.4*   PT/INR Recent Labs    10/18/20 0252  LABPROT 12.9  INR 1.0   ABG No results for input(s): PHART, HCO3 in the last 72 hours.  Invalid input(s): PCO2, PO2  Anti-infectives: Anti-infectives (From admission, onward)   Start     Dose/Rate Route Frequency Ordered Stop   10/18/20 0600  ceFEPIme (MAXIPIME) 2 g in sodium chloride 0.9 % 100 mL IVPB        2  g 200 mL/hr over 30 Minutes Intravenous Every 12 hours 10/17/20 1847     10/18/20 0100  metroNIDAZOLE (FLAGYL) IVPB 500 mg        500 mg 100 mL/hr over 60 Minutes Intravenous Every 8 hours 10/17/20 1802     10/17/20 1815  ceFEPIme (MAXIPIME) 2 g in sodium chloride 0.9 % 100 mL IVPB        2 g 200 mL/hr over 30 Minutes Intravenous  Once 10/17/20 1802 10/17/20 1902   10/17/20 1545  ciprofloxacin (CIPRO) IVPB 400 mg        400 mg 200 mL/hr over 60 Minutes Intravenous  Once 10/17/20 1544 10/17/20 1713   10/17/20 1545  metroNIDAZOLE (FLAGYL) IVPB 500 mg        500 mg 100 mL/hr over 60 Minutes Intravenous  Once 10/17/20 1544 10/17/20 1720      Medications: Scheduled Meds: . celecoxib  200 mg Oral q morning  . Chlorhexidine Gluconate Cloth  6 each Topical Daily  . fluticasone furoate-vilanterol  1 puff Inhalation Q1500  . heparin  5,000 Units Subcutaneous Q8H  . mouth rinse  15 mL Mouth Rinse BID  . sodium chloride flush  3 mL Intravenous Q12H  . sodium chloride flush  3 mL Intravenous Q12H   Continuous Infusions: . sodium chloride Stopped (10/17/20 1845)  . sodium chloride    . sodium chloride Stopped (10/17/20 2313)  . ceFEPime (MAXIPIME) IV Stopped (10/18/20 0533)  . dextrose 5 % and 0.45% NaCl 125  mL/hr at 10/18/20 0700  . metronidazole 500 mg (10/18/20 0821)  . norepinephrine (LEVOPHED) Adult infusion 3 mcg/min (10/18/20 0700)  . potassium chloride 10 mEq (10/18/20 0836)   PRN Meds:.sodium chloride, acetaminophen **OR** acetaminophen, diazepam, HYDROmorphone (DILAUDID) injection, ipratropium, ipratropium, magnesium citrate, nitroGLYCERIN, ondansetron **OR** ondansetron (ZOFRAN) IV, oxyCODONE, oxyCODONE, sodium chloride flush  Assessment/Plan: Ms. 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 Use [email or web address] to contact on call provider

## 2020-10-18 NOTE — Progress Notes (Signed)
Initial Nutrition Assessment  INTERVENTION:   -Boost Breeze po TID, each supplement provides 250 kcal and 9 grams of protein -while on clears  -Ensure Enlive po BID, each supplement provides 350 kcal and 20 grams of protein -once diet is advanced  NUTRITION DIAGNOSIS:   Increased nutrient needs related to acute illness as evidenced by estimated needs.  GOAL:   Patient will meet greater than or equal to 90% of their needs  MONITOR:   PO intake,Supplement acceptance,Labs,Weight trends,I & O's  REASON FOR ASSESSMENT:   Malnutrition Screening Tool    ASSESSMENT:   65 y.o. F with PMH significant for HFrEF 35%, COPD, CAD, Cardiomyopathy with ICD, depression and recurrent incisional hernias s/p repair, hypothyroidism who presented with 3 weeks of lower abdominal pain and CT showing colo-colonic fistulas.  Patient in room with SO at bedside. Reports she is still having some pain. Has only consumed jello today but states this did not worsen the pain.  Encouraged pt to continue to sip on liquids.  Pt and SO both report pt has not been eating well even before her symptoms started 3 weeks ago. Normal intake for pt is one "good" meal and she snacks and grazes throughout the day. Doesn't eat much meat as she doesn't tolerate them well. Drinks an Ensure in the morning most days.  Currently on clears. Agreeable to protein supplements. Recommend Ensure when diet advanced.  Per weight records, no weight loss noted. Pt reports UBW of 130 lbs.   Medications: D5 infusion, Levophed, KCl, Zofran  Labs reviewed: CBGs: 125 Low K  NUTRITION - FOCUSED PHYSICAL EXAM:  Flowsheet Row Most Recent Value  Orbital Region No depletion  Upper Arm Region No depletion  Thoracic and Lumbar Region Unable to assess  Buccal Region Mild depletion  Temple Region Moderate depletion  Clavicle Bone Region Mild depletion  Clavicle and Acromion Bone Region Mild depletion  Scapular Bone Region Mild depletion   Dorsal Hand No depletion  Patellar Region Unable to assess  Anterior Thigh Region Unable to assess  Posterior Calf Region Unable to assess  Edema (RD Assessment) None  Hair Reviewed  Mouth Reviewed  Skin Reviewed       Diet Order:   Diet Order            Diet clear liquid Room service appropriate? Yes; Fluid consistency: Thin  Diet effective now                 EDUCATION NEEDS:   No education needs have been identified at this time  Skin:  Skin Assessment: Reviewed RN Assessment  Last BM:  PTA  Height:   Ht Readings from Last 1 Encounters:  10/17/20 5' (1.524 m)    Weight:   Wt Readings from Last 1 Encounters:  10/18/20 61.1 kg   BMI:  Body mass index is 26.31 kg/m.  Estimated Nutritional Needs:   Kcal:  1550-1750  Protein:  75-90g  Fluid:  1.8L/day  Clayton Bibles, MS, RD, LDN Inpatient Clinical Dietitian Contact information available via Amion

## 2020-10-18 NOTE — Procedures (Signed)
Colonoscopy 09/2015 shows normal colonic mucosa and normal terminal ileum; pan diverticulosis

## 2020-10-18 NOTE — Progress Notes (Signed)
PHARMACY - PHYSICIAN COMMUNICATION CRITICAL VALUE ALERT - BLOOD CULTURE IDENTIFICATION (BCID)  [name] is an 65 y.o. female who presented to [facility] on 10/17/2020 with a chief complaint of septic shock, colo-colonic fistulas with diverticulitis.  Assessment:  BCID + 1/4 MRSE  Name of physician (or Provider) Contacted: Dr Maylene Roes  Current antibiotics: Cefepime, metronidazole   Changes to prescribed antibiotics recommended:  Likely contaminant, but due to septic shock on pressors, will cover for now with vancomycin and repeat blood cultures.   Results for orders placed or performed during the hospital encounter of 10/17/20  Blood Culture ID Panel (Reflexed) (Collected: 10/17/2020  1:15 PM)  Result Value Ref Range   Enterococcus faecalis NOT DETECTED NOT DETECTED   Enterococcus Faecium NOT DETECTED NOT DETECTED   Listeria monocytogenes NOT DETECTED NOT DETECTED   Staphylococcus species DETECTED (A) NOT DETECTED   Staphylococcus aureus (BCID) NOT DETECTED NOT DETECTED   Staphylococcus epidermidis DETECTED (A) NOT DETECTED   Staphylococcus lugdunensis NOT DETECTED NOT DETECTED   Streptococcus species NOT DETECTED NOT DETECTED   Streptococcus agalactiae NOT DETECTED NOT DETECTED   Streptococcus pneumoniae NOT DETECTED NOT DETECTED   Streptococcus pyogenes NOT DETECTED NOT DETECTED   A.calcoaceticus-baumannii NOT DETECTED NOT DETECTED   Bacteroides fragilis NOT DETECTED NOT DETECTED   Enterobacterales NOT DETECTED NOT DETECTED   Enterobacter cloacae complex NOT DETECTED NOT DETECTED   Escherichia coli NOT DETECTED NOT DETECTED   Klebsiella aerogenes NOT DETECTED NOT DETECTED   Klebsiella oxytoca NOT DETECTED NOT DETECTED   Klebsiella pneumoniae NOT DETECTED NOT DETECTED   Proteus species NOT DETECTED NOT DETECTED   Salmonella species NOT DETECTED NOT DETECTED   Serratia marcescens NOT DETECTED NOT DETECTED   Haemophilus influenzae NOT DETECTED NOT DETECTED   Neisseria  meningitidis NOT DETECTED NOT DETECTED   Pseudomonas aeruginosa NOT DETECTED NOT DETECTED   Stenotrophomonas maltophilia NOT DETECTED NOT DETECTED   Candida albicans NOT DETECTED NOT DETECTED   Candida auris NOT DETECTED NOT DETECTED   Candida glabrata NOT DETECTED NOT DETECTED   Candida krusei NOT DETECTED NOT DETECTED   Candida parapsilosis NOT DETECTED NOT DETECTED   Candida tropicalis NOT DETECTED NOT DETECTED   Cryptococcus neoformans/gattii NOT DETECTED NOT DETECTED   Methicillin resistance mecA/C DETECTED (A) NOT DETECTED   Gretta Arab PharmD, BCPS Clinical Pharmacist [facility] main pharmacy [phone] 10/18/2020 1:46 PM

## 2020-10-18 NOTE — Progress Notes (Signed)
Noted pt c/o pain and assessment completed. Upon return to bedside pt is asleep and  Noted SB/P and MAP are below set protocols values. Levo started at 63mcq/ kg. On going assessment

## 2020-10-18 NOTE — Progress Notes (Signed)
Pharmacy Antibiotic Note  [name] is a 65 y.o. female admitted on 10/17/2020 with Colo-colonic fistulas with diverticulitis.  Pharmacy has been consulted for Vancomycin dosing for possible bacteremia.  Potentially a contaminant, but due to septic shock on pressors, will cover for now.  Plan: Continue Cefepime and metronidazole per MD Vancomycin 1g IV x1 then 750 mg IV q24h. Follow up renal function, culture results, and clinical course. Repeat blood cultures.     Height: 5' (152.4 cm) Weight: 61.1 kg (134 lb 11.2 oz) IBW/kg (Calculated) : 45.5  Temp (24hrs), Avg:98 F (36.7 C), Min:97.7 F (36.5 C), Max:98.3 F (36.8 C)  Recent Labs  Lab 10/17/20 1322 10/17/20 2030 10/18/20 0252  WBC 15.3*  --  13.2*  CREATININE 1.47*  --  1.09*  LATICACIDVEN 1.1 1.3  --     Estimated Creatinine Clearance: 41.4 mL/min (A) (by C-G formula based on SCr of 1.09 mg/dL (H)).    No Known Allergies  Antimicrobials this admission: 2/22 Cipro x1 2/22 Cefepime >> 2/22 Metronidazole >>  2/23 Vancomycin >>   Dose adjustments this admission:   Microbiology results: 2/22 COVID: neg 2/22 MRSA PCR: neg 2/22 BCx: 1/4 GPC 2/23 BCID:  MRSE  Thank you for allowing pharmacy to be a part of this patient's care.  Gretta Arab PharmD, BCPS Clinical Pharmacist [facility] main pharmacy [phone] 10/18/2020 1:50 PM

## 2020-10-18 NOTE — Progress Notes (Signed)
NAME:  [name], MRN:  [id number], DOB:  [birth year], LOS: 1 ADMISSION DATE:  10/17/2020, CONSULTATION DATE:  10/18/20 REFERRING MD:  EDP, CHIEF COMPLAINT:  Abdominal pain   Brief History:  65 y.o. F with PMH significant for HFrEF 35%, COPD, CAD, Cardiomyopathy with ICD, depression and recurrent incisional hernias s/p repair, hypothyroidism who presented with 3 weeks of lower abdominal pain and CT showing colo-colonic fistulas.  She remained hypotensive after IVF, so PCCM consulted   Past Medical History:   has a past medical history of Admission for long-term opiate analgesic use (10/24/2019), Arthritis of right hip (05/29/2016), BMI 26.0-26.9,adult (03/29/2020), Cardiomyopathy (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 (06/19/2017), Dyslipidemia (05/31/2015), Essential hypertension (05/31/2015), GERD (gastroesophageal reflux disease), 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), Hypotension (05/17/2020), ICD (implantable cardiac defibrillator) in place (2012), ICD (implantable cardioverter-defibrillator) in place (06/19/2017), Ileus following gastrointestinal surgery ([place]) (03/26/2015), Major depressive disorder, single episode, moderate ([place]) (10/24/2019), Migraine without aura with status migrainosus (10/24/2019), Nausea alone (04/19/2014), Obstructive chronic bronchitis with exacerbation ([place]) (10/25/2019), Other spondylosis with radiculopathy, lumbar region  (10/24/2019), Persistent vomiting (05/17/2020), Presence of left artificial hip joint (10/24/2019), PTSD (post-traumatic stress disorder), 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   Significant Hospital Events:  2/22 Admit to PCCM; seen by PCCM, started in peripheral norepi. Seen by surg. They recommended conservative rx and broad spec abx. We also held cardiac meds 2/23 added stress dose steroids. Still has LLQ pain. Hemodynamics and end-organ fxn improved.   Consults:  General Surgery  Procedures:    Significant Diagnostic Tests:   2/22 CT abd/pelvis>> 1. Redemonstrated abnormal configuration of the distal descending colon and proximal sigmoid, which appears to involve multiplecolo-colonic fistulas and adhesion of adjacent loops. There is wall thickening and adjacent fat stranding in this vicinity, consistent with acute diverticulitis or other infectious or inflammatory colitis. There are occasional diverticula of this and other portions of the descending and sigmoid colon.2. Status post cholecystectomy and appendectomy.  Micro Data:  2/22 BCx2>> 2/22 Covid-19>>pending   Antimicrobials:   Flagyl 2/22- Cipro 2/22- Cefepime 2/22-  Interim History / Subjective:  Still has LLQ abd pain   Objective   Blood pressure (Abnormal) 119/57, pulse 69, temperature 98 F (36.7 C), temperature source Oral, resp. rate 13, height 5' (1.524 m), weight 61.1 kg, SpO2 100 %.        Intake/Output Summary (Last 24 hours) at 10/18/2020 0921 Last data filed at 10/18/2020 0700 Gross per 24 hour  Intake 5894.25 ml  Output 800 ml  Net 5094.25 ml   Filed Weights   10/17/20 1050 10/18/20 0347  Weight: 58.1 kg 61.1 kg    Examination:  General this is a 65 year old female who is resting in bed. No distress this am  HENT some temporal wasting. MMM no JVD pulm clear no accessory use Card  rrr abd LLQ discomfort more so w/ palpation  + bowel sounds gu voids  Neuro intact Ext warm and dry   Resolved Hospital Problem list   AKI resolved   Assessment & Plan:  Septic shock  related to colocolonic fistula with acute diverticulitis , no evidence of bowel leak -surg felt no surgical need as of now recommended conservative rx  -wbc ct dec'd -no sig fever spike -cultures neg -pressor requirements Plan Day 2 broad spec abx (cefepime/vanc/flagyl) NPO Trend cbc Keep euvolemic Holding coreg, entresto and lasix  Add stress dose steroids (Chronically on florinef) Titrate Norepi for SBP > 100  Cardiomyopathy-EF 35% Plan Holding home meds as above  Fluid, electrolyte & acid base imbalance: hypokalemia, Hyperchloremia, NAGMA Plan Replace K Change NaCl to D5LR Trend lytes  Acute on chronic pain, chronic opiate use -Oxycodone will be held while n.p.o., use dilaudid as needed - watch for BDZ withdrawal   Best practice (evaluated daily)  Diet: NPO Pain/Anxiety/Delirium protocol (if indicated): dilaudid as needed VAP protocol (if indicated): N/A DVT prophylaxis: Lovenox GI prophylaxis: Protonix Glucose control: SSI Mobility: Bedrest Disposition: ICU  Goals of Care:   Code Status: full  My cct 34 min  Erick Colace ACNP-BC [place] Pager # [phone] OR # [phone] if no answer

## 2020-10-19 DIAGNOSIS — R6521 Severe sepsis with septic shock: Secondary | ICD-10-CM | POA: Diagnosis not present

## 2020-10-19 DIAGNOSIS — A419 Sepsis, unspecified organism: Secondary | ICD-10-CM | POA: Diagnosis not present

## 2020-10-19 DIAGNOSIS — N179 Acute kidney failure, unspecified: Secondary | ICD-10-CM | POA: Diagnosis not present

## 2020-10-19 LAB — ANCA TITERS
Atypical P-ANCA titer: <1:20 {titer}
C-ANCA: <1:20 {titer}
P-ANCA: <1:20 {titer}

## 2020-10-19 LAB — BASIC METABOLIC PANEL
Anion gap: 10 (ref 5–15)
BUN: 14 mg/dL (ref 8–23)
CO2: 17 mmol/L — ABNORMAL LOW (ref 22–32)
Calcium: 7.3 mg/dL — ABNORMAL LOW (ref 8.9–10.3)
Chloride: 114 mmol/L — ABNORMAL HIGH (ref 98–111)
Creatinine, Ser: 0.75 mg/dL (ref 0.44–1.00)
GFR, Estimated: >60 mL/min (ref >60)
Glucose, Bld: 111 mg/dL — ABNORMAL HIGH (ref 70–99)
Potassium: 3.9 mmol/L (ref 3.5–5.1)
Sodium: 141 mmol/L (ref 135–145)

## 2020-10-19 LAB — CBC
HCT: 24.8 % — ABNORMAL LOW (ref 36.0–46.0)
Hemoglobin: 7.8 g/dL — ABNORMAL LOW (ref 12.0–15.0)
MCH: 30.2 pg (ref 26.0–34.0)
MCHC: 31.5 g/dL (ref 30.0–36.0)
MCV: 96.1 fL (ref 80.0–100.0)
Platelets: 325 10*3/uL (ref 150–400)
RBC: 2.58 MIL/uL — ABNORMAL LOW (ref 3.87–5.11)
RDW: 12.5 % (ref 11.5–15.5)
WBC: 11.5 10*3/uL — ABNORMAL HIGH (ref 4.0–10.5)
nRBC: 0 % (ref 0.0–0.2)

## 2020-10-19 LAB — MAGNESIUM: Magnesium: 1.6 mg/dL — ABNORMAL LOW (ref 1.7–2.4)

## 2020-10-19 LAB — HIGH SENSITIVITY CRP: CRP, High Sensitivity: 115.26 mg/L — ABNORMAL HIGH (ref 0.00–3.00)

## 2020-10-19 LAB — GLUCOSE, CAPILLARY: Glucose-Capillary: 102 mg/dL — ABNORMAL HIGH (ref 70–99)

## 2020-10-19 MED ORDER — MAGNESIUM SULFATE 4 GM/100ML IV SOLN
4.0000 g | Freq: Once | INTRAVENOUS | Status: AC
  Administered 2020-10-19: 4 g via INTRAVENOUS
  Filled 2020-10-19: qty 100

## 2020-10-19 NOTE — Progress Notes (Signed)
CC: Abdominal pain  Subjective: Patient lying in bed appears comfortable off pressors.  She still complains of generalized lower abdominal pain.  She says it is better than it was although it still persists.  On exam she is most tender in the left lower quadrant.  Objective: Vital signs in last 24 hours: Temp:  [97.8 F (36.6 C)-98.1 F (36.7 C)] 97.8 F (36.6 C) (02/24 0400) Pulse Rate:  [66-90] 77 (02/24 0300) Resp:  [8-24] 10 (02/24 0300) BP: (80-126)/(42-81) 101/58 (02/24 0300) SpO2:  [98 %-100 %] 98 % (02/24 0300) Weight:  [61.7 kg] 61.7 kg (02/24 0500) Last BM Date:  (PTA) 600 p.o. 1300 IV 2350 urine Afebrile, last blood pressure 120/62 Mag 1.6, potassium 3.9, creatinine 0.75 WBC 11.5 H/H 7.8/24.8 Platelets 325,000 CT 2/21: Intake/Output from previous day: 02/23 0701 - 02/24 0700 In: 2004.2 [P.O.:600; I.V.:663.5; IV Piggyback:740.7] Out: 2350 [Urine:2350] Intake/Output this shift: No intake/output data recorded.  General appearance: alert, cooperative and no distress Resp: Clear to auscultation GI: No distention, soft, bowel sounds present, tender both the left and right lower quadrants.  Most tender in the left lower quadrant.  Lab Results:  Recent Labs    10/18/20 0252 10/19/20 0239  WBC 13.2* 11.5*  HGB 9.6* 7.8*  HCT 30.0* 24.8*  PLT 389 325    BMET Recent Labs    10/18/20 0252 10/19/20 0239  NA 142 141  K 3.1* 3.9  CL 114* 114*  CO2 18* 17*  GLUCOSE 148* 111*  BUN 23 14  CREATININE 1.09* 0.75  CALCIUM 7.4* 7.3*   PT/INR Recent Labs    10/18/20 0252  LABPROT 12.9  INR 1.0    Recent Labs  Lab 10/17/20 1322  AST 14*  ALT 11  ALKPHOS 74  BILITOT 0.7  PROT 6.6  ALBUMIN 3.1*     Lipase     Component Value Date/Time   LIPASE 13 (L) 03/26/2015 1230   Prior to Admission medications   Medication Sig Start Date End Date Taking? Authorizing Provider  aspirin 81 MG tablet Take 81 mg by mouth daily.   Yes [provider]  Buprenorphine HCl (BELBUCA) 900 MCG FILM Place 900 mcg inside cheek every 12 (twelve) hours.   Yes [provider]  carvedilol (COREG) 3.125 MG tablet TAKE 1 TABLET BY MOUTH TWICE DAILY 10/04/20  Yes Lillard Anes, MD  celecoxib (CELEBREX) 200 MG capsule TAKE ONE CAPSULE BY MOUTH EVERY MORNING 06/15/20  Yes Lillard Anes, MD  cyclobenzaprine (FLEXERIL) 10 MG tablet Take 10 mg by mouth 3 (three) times daily as needed for muscle spasms.   Yes [provider]  diazepam (VALIUM) 5 MG tablet Take 1 tablet (5 mg total) by mouth 3 (three) times daily as needed. Patient taking differently: Take 5 mg by mouth 3 (three) times daily as needed for anxiety. 07/27/20  Yes Lillard Anes, MD  ENTRESTO 24-26 MG TAKE ONE TABLET BY MOUTH EVERY MORNING and TAKE ONE TABLET BY MOUTH EVERY EVENING 06/15/20  Yes Lillard Anes, MD  esomeprazole (NEXIUM) 20 MG capsule Take 1 capsule (20 mg total) by mouth daily. 10/02/20  Yes Lillard Anes, MD  fludrocortisone (FLORINEF) 0.1 MG tablet TAKE 1 TABLET BY MOUTH AS DIRECTED. Patient taking differently: Take 0.1 mg by mouth daily. 10/12/20  Yes Lillard Anes, MD  fluticasone furoate-vilanterol (BREO ELLIPTA) 100-25 MCG/INH AEPB Inhale 1 puff into the lungs daily in the afternoon. 10/03/20  Yes Lillard Anes,  MD  Oxycodone HCl 10 MG TABS Take 1 tablet by mouth every 4 (four) hours as needed (pain).   Yes [provider]  ranolazine (RANEXA) 500 MG 12 hr tablet TAKE 1 TABLET(500 MG) BY MOUTH TWICE DAILY 10/04/20  Yes Park Liter, MD  rosuvastatin (CRESTOR) 20 MG tablet TAKE ONE TABLET BY MOUTH EVERY MORNING 09/27/20  Yes Lillard Anes, MD  sertraline (ZOLOFT) 100 MG tablet Take 0.5 tablets (50 mg total) by mouth daily. 08/11/20  Yes Lillard Anes, MD  tiZANidine (ZANAFLEX) 4 MG tablet Take 4 mg by mouth at bedtime.   Yes [provider]  topiramate  (TOPAMAX) 50 MG tablet Take 2 tablets (100 mg total) by mouth at bedtime. 01/21/20  Yes Lillard Anes, MD  Ubrogepant (UBRELVY) 100 MG TABS Take 1 tablet by mouth as needed (May repeat 1 tablet after 2 hours if needed.  Maximum 2 tablets in 24 hours). Patient taking differently: Take 1 tablet by mouth every 2 (two) hours as needed (May repeat 1 tablet after 2 hours if needed.  Maximum 2 tablets in 24 hours). 01/21/20  Yes Lillard Anes, MD  denosumab [facility]) 60 MG/ML SOSY injection Inject 60 mg into the skin every 6 (six) months. 02/14/20   Lillard Anes, MD  diphenoxylate-atropine (LOMOTIL) 2.5-0.025 MG tablet Take 1 tablet by mouth 4 (four) times daily as needed for diarrhea or loose stools. 05/19/20   Lillard Anes, MD  EPINEPHRINE 0.3 mg/0.3 mL IJ SOAJ injection Inject 0.3 mLs (0.3 mg total) into the muscle as needed for anaphylaxis. 04/27/20   Lillard Anes, MD  fluticasone [facility]) 50 MCG/ACT nasal spray Place 2 sprays into both nostrils daily as needed (congestion). 01/21/20   Lillard Anes, MD  furosemide (LASIX) 40 MG tablet TAKE ONE TABLET BY MOUTH EVERY MORNING MAY take 0.5 tablet in THE afternoon IF needed Patient taking differently: Take 20 mg by mouth daily as needed for fluid. 06/15/20   Lillard Anes, MD  gabapentin (NEURONTIN) 800 MG tablet Take 1 tablet (800 mg total) by mouth 3 (three) times daily. Patient usually takes twice a day Patient taking differently: Take 800 mg by mouth 2 (two) times daily as needed (pain). 01/21/20   Lillard Anes, MD  ipratropium (ATROVENT) 0.02 % nebulizer solution Take 2.5 mLs (0.5 mg total) by nebulization every 4 (four) hours as needed for wheezing or shortness of breath. 01/21/20   Lillard Anes, MD  levocetirizine (XYZAL) 5 MG tablet Take 5 mg by mouth daily as needed for allergies. 12/13/19   [provider]  LINZESS 145 MCG CAPS capsule Take 145 mcg by mouth daily as  needed (indigestion). 08/22/20   [provider]  nitroGLYCERIN (NITROSTAT) 0.4 MG SL tablet Place 1 tablet (0.4 mg total) under the tongue every 5 (five) minutes as needed for chest pain. 01/21/20   Lillard Anes, MD  ondansetron (ZOFRAN) 4 MG tablet Take 1 tablet (4 mg total) by mouth every 8 (eight) hours as needed for nausea or vomiting. 05/17/20   Lillard Anes, MD      Medications: . celecoxib  200 mg Oral q morning  . Chlorhexidine Gluconate Cloth  6 each Topical Daily  . feeding supplement  1 Container Oral TID BM  . fluticasone furoate-vilanterol  1 puff Inhalation Q1500  . heparin  5,000 Units Subcutaneous Q8H  . hydrocortisone sod succinate (SOLU-CORTEF) inj  50 mg Intravenous Q6H  . mouth rinse  15 mL Mouth Rinse BID  . rosuvastatin  20 mg Oral q morning  . sertraline  50 mg Oral Daily  . sodium chloride flush  3 mL Intravenous Q12H  . sodium chloride flush  3 mL Intravenous Q12H  . topiramate  100 mg Oral QHS   . sodium chloride    . sodium chloride Stopped (10/17/20 2313)  . ceFEPime (MAXIPIME) IV Stopped (10/19/20 0630)  . dextrose 5% lactated ringers Stopped (10/18/20 1436)  . metronidazole Stopped (10/19/20 0147)  . norepinephrine (LEVOPHED) Adult infusion Stopped (10/19/20 0300)  . vancomycin      Assessment/Plan Chronic systolic CHF/cardiomyopathy/BiV ICD3 COPD AKI Hypokalemia Chronic pain/oxycodone-Neurontin Hyperlipidemia Hx anxiety/depression Hx migraine Polypharmacy Hypomagnesemia-being replaced Anemia   Sepsis  -WBC 15.3>> 13.2>> 11.5 Inflamed sigmoid colon with possible colocolonic fistula Hx pandiverticulosis Hx hernia repair 2016/small bowel resection 2011  -GI: No signs of Crohn's/inflammatory bowel disease per colonoscopy 2017, inflammatory markers being evaluated; recommend continued conservative management with IV antibiotics  FEN: IV fluids/clear liquids ID: Cipro x 1 2/22; cefepime 2/22 >> day 3; Flagyl  2/22>> day 3 DVT: SCDs    Plan: Ongoing medical management.      LOS: 2 days    Alexes Lamarque 10/19/2020 Please see Amion

## 2020-10-19 NOTE — Progress Notes (Signed)
Mg 1.6  Replaced per protocol  

## 2020-10-19 NOTE — Progress Notes (Signed)
PROGRESS NOTE    LEVETTA [name]  JGG:[id number] DOB: 12-06-1955 DOA: 10/17/2020 PCP: Lillard Anes, MD     Brief Narrative:  [name] is a 65 y.o. female with medical history significant of arthritis, cardiomyopathy, chronic back pain, COPD, chronic narcotic use, chronic knee pains, chronic systolic congestive heart failure, dual ICD, hyperlipidemia, hypertension, GERD, major depressive disorder, PTSD, recurrent incisional hernia repair status post flap repair 03/15/2015 who presented with abdominal pain x3 weeks intermittent, associated with intermittent nausea but no vomiting. She was seen by PCP in [place] 10/16/20 and return for complete abdominal CT reading "Abnormal posterior, proximal sigmoid multiple colocolonic fistula and adhesion adjacent to a loop, wall thickening, consistent with acute diverticulitis or other infectious or inflammatory colitis."  She was started on empiric IV antibiotics and admitted to the hospital.  She remained hypotensive and was transferred to ICU for pressors.  PCCM, general surgery, GI consulted.  New events last 24 hours / Subjective: States that she feels significantly better since admission.  Continues to have some lower abdominal discomfort but nowhere as bad as it was previously.  Assessment & Plan:   Principal Problem:   Sepsis ([place]) Active Problems:   COPD (chronic obstructive pulmonary disease) (HCC)   Cardiomyopathy (HCC)   Chronic back pain   GERD (gastroesophageal reflux disease)   Chronic systolic congestive heart failure, NYHA class 2 (HCC)   Dilated cardiomyopathy ([place])   ICD (implantable cardioverter-defibrillator) in place   Dyslipidemia   Essential hypertension   Hypotension   Abdominal pain   Acute diverticulitis   Colonic fistula   Septic shock secondary to acute diverticulitis as well as colocolonic fistula -Presented with sepsis along with endorgan damage including AKI, hypotension requiring pressors -Now off  pressors, appreciate PCCM.  They have signed off -GI does not feel that patient's presentation is typical of Crohn's/IBD.  Likely to be a complication from underlying progressive and severe diverticular disease.  Inflammatory markers were ordered.  Following peripherally at this time.   -CRP 115.26, sed rate 52 -WBC trending down -Blood culture 2/22 single set showing staph epidermidis.  Likely to be a contaminant, however added vancomycin due to patient's presentation with shock.  Repeat blood cultures drawn 2/23.  If repeat cultures are negative, can discontinue vancomycin -Continue empiric cefepime, Flagyl -Continue stress dose steroids, patient is chronically on Florinef -General surgery following  Hypomagnesemia -Replace  Chronic systolic CHF, cardiomyopathy with biventricular ICD in place -Previous EF 35 to 40% -Continue to monitor volume status closely -Holding Coreg, Entresto, Ranexa and Lasix due to shock  AKI -Baseline creatinine 1.1  -Resolved   Anxiety -Continue Valium, Zoloft, Topamax  Chronic pain -Continue oxycodone, neurontin  Hyperlipidemia -Continue Crestor    DVT prophylaxis:  Place and maintain sequential compression device Start: 10/19/20 0753 heparin injection 5,000 Units Start: 10/17/20 2200 TED hose Start: 10/17/20 1757 SCDs Start: 10/17/20 1757  Code Status: Full code Family Communication: No family at bedside Disposition Plan:  Status is: Inpatient  Remains inpatient appropriate because:IV treatments appropriate due to intensity of illness or inability to take PO   Dispo: The patient is from: Home              Anticipated d/c is to: Home              Anticipated d/c date is: 2 days              Patient currently is not medically stable to d/c.  Remains on IV  antibiotics.   Difficult to place patient No      Consultants:   PCCM  General surgery  GI  Procedures:   None   Antimicrobials:  Anti-infectives (From admission,  onward)   Start     Dose/Rate Route Frequency Ordered Stop   10/19/20 1400  vancomycin (VANCOREADY) IVPB 750 mg/150 mL        750 mg 150 mL/hr over 60 Minutes Intravenous Every 24 hours 10/18/20 1951     10/18/20 1445  vancomycin (VANCOREADY) IVPB 1000 mg/200 mL        1,000 mg 200 mL/hr over 60 Minutes Intravenous  Once 10/18/20 1348 10/18/20 1536   10/18/20 0600  ceFEPIme (MAXIPIME) 2 g in sodium chloride 0.9 % 100 mL IVPB        2 g 200 mL/hr over 30 Minutes Intravenous Every 12 hours 10/17/20 1847     10/18/20 0100  metroNIDAZOLE (FLAGYL) IVPB 500 mg        500 mg 100 mL/hr over 60 Minutes Intravenous Every 8 hours 10/17/20 1802     10/17/20 1815  ceFEPIme (MAXIPIME) 2 g in sodium chloride 0.9 % 100 mL IVPB        2 g 200 mL/hr over 30 Minutes Intravenous  Once 10/17/20 1802 10/17/20 1902   10/17/20 1545  ciprofloxacin (CIPRO) IVPB 400 mg        400 mg 200 mL/hr over 60 Minutes Intravenous  Once 10/17/20 1544 10/17/20 1713   10/17/20 1545  metroNIDAZOLE (FLAGYL) IVPB 500 mg        500 mg 100 mL/hr over 60 Minutes Intravenous  Once 10/17/20 1544 10/17/20 1720       Objective: Vitals:   10/19/20 0500 10/19/20 0700 10/19/20 0800 10/19/20 0900  BP:  (!) 106/59 115/63 124/75  Pulse:  81    Resp:  12 18 15   Temp:   97.8 F (36.6 C)   TempSrc:   [place]   SpO2:  97% 99%   Weight: 61.7 kg     Height:        Intake/Output Summary (Last 24 hours) at 10/19/2020 1022 Last data filed at 10/19/2020 0700 Gross per 24 hour  Intake 1674.91 ml  Output 2350 ml  Net -675.09 ml   Filed Weights   10/17/20 1050 10/18/20 0347 10/19/20 0500  Weight: 58.1 kg 61.1 kg 61.7 kg    Examination: General exam: Appears calm and comfortable  Respiratory system: Clear to auscultation. Respiratory effort normal. Cardiovascular system: S1 & S2 heard, RRR. No pedal edema. Gastrointestinal system: Abdomen is nondistended, soft and tender to palpation lower abdomen Central nervous system: Alert and  oriented. Non focal exam. Speech clear  Extremities: Symmetric in appearance bilaterally  Skin: No rashes, lesions or ulcers on exposed skin  Psychiatry: Judgement and insight appear stable. Mood & affect appropriate.   Data Reviewed: I have personally reviewed following labs and imaging studies  CBC: Recent Labs  Lab 10/17/20 1322 10/18/20 0252 10/19/20 0239  WBC 15.3* 13.2* 11.5*  NEUTROABS 12.1*  --   --   HGB 10.4* 9.6* 7.8*  HCT 31.7* 30.0* 24.8*  MCV 92.4 94.0 96.1  PLT 402* 389 109   Basic Metabolic Panel: Recent Labs  Lab 10/17/20 1322 10/18/20 0252 10/19/20 0239  NA 139 142 141  K 3.8 3.1* 3.9  CL 105 114* 114*  CO2 22 18* 17*  GLUCOSE 113* 148* 111*  BUN 31* 23 14  CREATININE 1.47* 1.09* 0.75  CALCIUM 8.8*  7.4* 7.3*  MG  --   --  1.6*   GFR: Estimated Creatinine Clearance: 56.8 mL/min (by C-G formula based on SCr of 0.75 mg/dL). Liver Function Tests: Recent Labs  Lab 10/17/20 1322  AST 14*  ALT 11  ALKPHOS 74  BILITOT 0.7  PROT 6.6  ALBUMIN 3.1*   No results for input(s): LIPASE, AMYLASE in the last 168 hours. No results for input(s): AMMONIA in the last 168 hours. Coagulation Profile: Recent Labs  Lab 10/18/20 0252  INR 1.0   Cardiac Enzymes: No results for input(s): CKTOTAL, CKMB, CKMBINDEX, TROPONINI in the last 168 hours. BNP (last 3 results) Recent Labs    12/02/19 1055 09/05/20 1138  PROBNP 1,044* 546*   HbA1C: No results for input(s): HGBA1C in the last 72 hours. CBG: Recent Labs  Lab 10/18/20 0723 10/19/20 0745  GLUCAP 125* 102*   Lipid Profile: No results for input(s): CHOL, HDL, LDLCALC, TRIG, CHOLHDL, LDLDIRECT in the last 72 hours. Thyroid Function Tests: No results for input(s): TSH, T4TOTAL, FREET4, T3FREE, THYROIDAB in the last 72 hours. Anemia Panel: No results for input(s): VITAMINB12, FOLATE, FERRITIN, TIBC, IRON, RETICCTPCT in the last 72 hours. Sepsis Labs: Recent Labs  Lab 10/17/20 1322 10/17/20 2030   LATICACIDVEN 1.1 1.3    Recent Results (from the past 240 hour(s))  Culture, blood (routine x 2)     Status: Abnormal (Preliminary result)   Collection Time: 10/17/20  1:15 PM   Specimen: BLOOD RIGHT FOREARM  Result Value Ref Range Status   Specimen Description   Final    BLOOD RIGHT FOREARM Performed at [facility] [address], [place], [place] [address]    Special Requests   Final    BOTTLES DRAWN AEROBIC AND ANAEROBIC Blood Culture adequate volume Performed at [facility] [address] [place], [place] [address]    Culture  Setup Time   Final    GRAM POSITIVE COCCI IN CLUSTERS ANAEROBIC BOTTLE ONLY Organism ID to follow CRITICAL RESULT CALLED TO, READ BACK BY AND VERIFIED WITH: Sheffield Slider PHARMD [id number] 10/18/20 A BROWNING    Culture (A)  Final    STAPHYLOCOCCUS EPIDERMIDIS THE SIGNIFICANCE OF ISOLATING THIS ORGANISM FROM A SINGLE SET OF BLOOD CULTURES WHEN MULTIPLE SETS ARE DRAWN IS UNCERTAIN. PLEASE NOTIFY THE MICROBIOLOGY DEPARTMENT WITHIN ONE WEEK IF SPECIATION AND SENSITIVITIES ARE REQUIRED. Performed at [facility], [place] [address] [place], [place] [address]    Report Status PENDING  Incomplete  Blood Culture ID Panel (Reflexed)     Status: Abnormal   Collection Time: 10/17/20  1:15 PM  Result Value Ref Range Status   Enterococcus faecalis NOT DETECTED NOT DETECTED Final   Enterococcus Faecium NOT DETECTED NOT DETECTED Final   Listeria monocytogenes NOT DETECTED NOT DETECTED Final   Staphylococcus species DETECTED (A) NOT DETECTED Final    Comment: CRITICAL RESULT CALLED TO, READ BACK BY AND VERIFIED WITH: Sheffield Slider PHARMD [id number] 10/18/20 A BROWNING    Staphylococcus aureus (BCID) NOT DETECTED NOT DETECTED Final   Staphylococcus epidermidis DETECTED (A) NOT DETECTED Final    Comment: Methicillin (oxacillin) resistant coagulase negative staphylococcus. Possible blood culture contaminant (unless isolated from more than  one blood culture draw or clinical case suggests pathogenicity). No antibiotic treatment is indicated for blood  culture contaminants. CRITICAL RESULT CALLED TO, READ BACK BY AND VERIFIED WITH: Sheffield Slider PHARMD [id number] 10/18/20 A BROWNING    Staphylococcus lugdunensis NOT DETECTED NOT DETECTED Final  Streptococcus species NOT DETECTED NOT DETECTED Final   Streptococcus agalactiae NOT DETECTED NOT DETECTED Final   Streptococcus pneumoniae NOT DETECTED NOT DETECTED Final   Streptococcus pyogenes NOT DETECTED NOT DETECTED Final   A.calcoaceticus-baumannii NOT DETECTED NOT DETECTED Final   Bacteroides fragilis NOT DETECTED NOT DETECTED Final   Enterobacterales NOT DETECTED NOT DETECTED Final   Enterobacter cloacae complex NOT DETECTED NOT DETECTED Final   Escherichia coli NOT DETECTED NOT DETECTED Final   Klebsiella aerogenes NOT DETECTED NOT DETECTED Final   Klebsiella oxytoca NOT DETECTED NOT DETECTED Final   Klebsiella pneumoniae NOT DETECTED NOT DETECTED Final   Proteus species NOT DETECTED NOT DETECTED Final   Salmonella species NOT DETECTED NOT DETECTED Final   Serratia marcescens NOT DETECTED NOT DETECTED Final   Haemophilus influenzae NOT DETECTED NOT DETECTED Final   Neisseria meningitidis NOT DETECTED NOT DETECTED Final   Pseudomonas aeruginosa NOT DETECTED NOT DETECTED Final   Stenotrophomonas maltophilia NOT DETECTED NOT DETECTED Final   Candida albicans NOT DETECTED NOT DETECTED Final   Candida auris NOT DETECTED NOT DETECTED Final   Candida glabrata NOT DETECTED NOT DETECTED Final   Candida krusei NOT DETECTED NOT DETECTED Final   Candida parapsilosis NOT DETECTED NOT DETECTED Final   Candida tropicalis NOT DETECTED NOT DETECTED Final   Cryptococcus neoformans/gattii NOT DETECTED NOT DETECTED Final   Methicillin resistance mecA/C DETECTED (A) NOT DETECTED Final    Comment: CRITICAL RESULT CALLED TO, READ BACK BY AND VERIFIED WITH: Sheffield Slider [facility] [id number] 10/18/20 A  BROWNING Performed at [facility], [address], [place], [place] [address]   Culture, blood (routine x 2)     Status: None (Preliminary result)   Collection Time: 10/17/20  1:22 PM   Specimen: BLOOD  Result Value Ref Range Status   Specimen Description   Final    BLOOD SITE NOT SPECIFIED Performed at [facility], [place] [address], [place], [place] [address]    Special Requests   Final    BOTTLES DRAWN AEROBIC AND ANAEROBIC Blood Culture results may not be optimal due to an inadequate volume of blood received in culture bottles Performed at [facility] [address], [place], [place] [address]    Culture   Final    NO GROWTH 2 DAYS Performed at [facility] [address] [place],  [address]    Report Status PENDING  Incomplete  SARS CORONAVIRUS 2 (TAT 6-24 HRS) Nasopharyngeal Nasopharyngeal Swab     Status: None   Collection Time: 10/17/20  5:24 PM   Specimen: Nasopharyngeal Swab  Result Value Ref Range Status   SARS Coronavirus 2 NEGATIVE NEGATIVE Final    Comment: (NOTE) SARS-CoV-2 target nucleic acids are NOT DETECTED.  The SARS-CoV-2 RNA is generally detectable in upper and lower respiratory specimens during the acute phase of infection. Negative results do not preclude SARS-CoV-2 infection, do not rule out co-infections with other pathogens, and should not be used as the sole basis for treatment or other patient management decisions. Negative results must be combined with clinical observations, patient history, and epidemiological information. The expected result is Negative.  Fact Sheet for Patients: SugarRoll.be  Fact Sheet for Healthcare Providers: https://www.woods-mathews.com/  This test is not yet approved or cleared by the [place] FDA and  has been authorized for detection and/or diagnosis of SARS-CoV-2 by FDA under an Emergency Use Authorization (EUA). This EUA  will remain  in effect (meaning this test can be  used) for the duration of the COVID-19 declaration under Se ction 564(b)(1) of the Act, 21 U.S.C. section 360bbb-3(b)(1), unless the authorization is terminated or revoked sooner.  Performed at [facility], [place] [address], [place], [place] [address]   MRSA PCR Screening     Status: None   Collection Time: 10/17/20 10:46 PM   Specimen: Nasopharyngeal  Result Value Ref Range Status   MRSA by PCR NEGATIVE NEGATIVE Final    Comment:        The GeneXpert MRSA Assay (FDA approved for NASAL specimens only), is one component of a comprehensive MRSA colonization surveillance program. It is not intended to diagnose MRSA infection nor to guide or monitor treatment for MRSA infections. Performed at [facility], [place] [address], [place], [place] [address]   Culture, blood (Routine X 2) w Reflex to ID Panel     Status: None (Preliminary result)   Collection Time: 10/18/20  2:14 PM   Specimen: BLOOD RIGHT HAND  Result Value Ref Range Status   Specimen Description   Final    BLOOD RIGHT HAND Performed at [facility] [address] [place], [place] [address]    Special Requests   Final    BOTTLES DRAWN AEROBIC ONLY Blood Culture adequate volume Performed at [facility] [address], [place], [place] [address]    Culture   Final    NO GROWTH < 24 HOURS Performed at [facility] [address], [place], [place] [address]    Report Status PENDING  Incomplete  Culture, blood (Routine X 2) w Reflex to ID Panel     Status: None (Preliminary result)   Collection Time: 10/18/20  2:14 PM   Specimen: BLOOD LEFT HAND  Result Value Ref Range Status   Specimen Description   Final    BLOOD LEFT HAND Performed at [facility] [address], [place], [place] [address]    Special Requests   Final    BOTTLES DRAWN AEROBIC ONLY Blood Culture adequate  volume Performed at [facility] [address], [place], [place] [address]    Culture   Final    NO GROWTH < 24 HOURS Performed at [facility] [address] [place], [place] [address]    Report Status PENDING  Incomplete      Radiology Studies: No results found.    Scheduled Meds: . celecoxib  200 mg Oral q morning  . Chlorhexidine Gluconate Cloth  6 each Topical Daily  . feeding supplement  1 Container Oral TID BM  . fluticasone furoate-vilanterol  1 puff Inhalation Q1500  . heparin  5,000 Units Subcutaneous Q8H  . hydrocortisone sod succinate (SOLU-CORTEF) inj  50 mg Intravenous Q6H  . mouth rinse  15 mL Mouth Rinse BID  . rosuvastatin  20 mg Oral q morning  . sertraline  50 mg Oral Daily  . sodium chloride flush  3 mL Intravenous Q12H  . sodium chloride flush  3 mL Intravenous Q12H  . topiramate  100 mg Oral QHS   Continuous Infusions: . sodium chloride    . sodium chloride Stopped (10/17/20 2313)  . ceFEPime (MAXIPIME) IV Stopped (10/19/20 0630)  . dextrose 5% lactated ringers Stopped (10/18/20 1436)  . metronidazole Stopped (10/19/20 0908)  . norepinephrine (LEVOPHED) Adult infusion Stopped (10/19/20 0300)  . vancomycin       LOS: 2 days      Time spent:  25 minutes   Dessa Phi, DO [facility] 10/19/2020, 10:22 AM   Available via Epic secure chat 7am-7pm After these hours, please refer to coverage provider listed on [email or web address]

## 2020-10-20 DIAGNOSIS — E440 Moderate protein-calorie malnutrition: Secondary | ICD-10-CM

## 2020-10-20 DIAGNOSIS — A419 Sepsis, unspecified organism: Secondary | ICD-10-CM | POA: Diagnosis not present

## 2020-10-20 DIAGNOSIS — R6521 Severe sepsis with septic shock: Secondary | ICD-10-CM | POA: Diagnosis not present

## 2020-10-20 DIAGNOSIS — N3946 Mixed incontinence: Secondary | ICD-10-CM | POA: Insufficient documentation

## 2020-10-20 DIAGNOSIS — N179 Acute kidney failure, unspecified: Secondary | ICD-10-CM | POA: Diagnosis not present

## 2020-10-20 HISTORY — DX: Moderate protein-calorie malnutrition: E44.0

## 2020-10-20 HISTORY — DX: Mixed incontinence: N39.46

## 2020-10-20 LAB — BASIC METABOLIC PANEL
Anion gap: 6 (ref 5–15)
BUN: 14 mg/dL (ref 8–23)
CO2: 20 mmol/L — ABNORMAL LOW (ref 22–32)
Calcium: 7.4 mg/dL — ABNORMAL LOW (ref 8.9–10.3)
Chloride: 114 mmol/L — ABNORMAL HIGH (ref 98–111)
Creatinine, Ser: 0.78 mg/dL (ref 0.44–1.00)
GFR, Estimated: >60 mL/min (ref >60)
Glucose, Bld: 121 mg/dL — ABNORMAL HIGH (ref 70–99)
Potassium: 3.4 mmol/L — ABNORMAL LOW (ref 3.5–5.1)
Sodium: 140 mmol/L (ref 135–145)

## 2020-10-20 LAB — CBC
HCT: 28.2 % — ABNORMAL LOW (ref 36.0–46.0)
Hemoglobin: 9.2 g/dL — ABNORMAL LOW (ref 12.0–15.0)
MCH: 30.2 pg (ref 26.0–34.0)
MCHC: 32.6 g/dL (ref 30.0–36.0)
MCV: 92.5 fL (ref 80.0–100.0)
Platelets: 413 10*3/uL — ABNORMAL HIGH (ref 150–400)
RBC: 3.05 MIL/uL — ABNORMAL LOW (ref 3.87–5.11)
RDW: 12.7 % (ref 11.5–15.5)
WBC: 15.4 10*3/uL — ABNORMAL HIGH (ref 4.0–10.5)
nRBC: 0 % (ref 0.0–0.2)

## 2020-10-20 LAB — MAGNESIUM: Magnesium: 3 mg/dL — ABNORMAL HIGH (ref 1.7–2.4)

## 2020-10-20 LAB — CULTURE, BLOOD (ROUTINE X 2): Special Requests: ADEQUATE

## 2020-10-20 LAB — GLUCOSE, CAPILLARY: Glucose-Capillary: 122 mg/dL — ABNORMAL HIGH (ref 70–99)

## 2020-10-20 MED ORDER — POTASSIUM CHLORIDE CRYS ER 20 MEQ PO TBCR
40.0000 meq | EXTENDED_RELEASE_TABLET | Freq: Once | ORAL | Status: AC
  Administered 2020-10-20: 40 meq via ORAL
  Filled 2020-10-20: qty 2

## 2020-10-20 MED ORDER — FLUDROCORTISONE ACETATE 0.1 MG PO TABS
0.1000 mg | ORAL_TABLET | Freq: Every day | ORAL | Status: DC
  Administered 2020-10-20 – 2020-10-29 (×9): 0.1 mg via ORAL
  Filled 2020-10-20 (×10): qty 1

## 2020-10-20 NOTE — Plan of Care (Signed)
  Problem: Education: Goal: Knowledge of General Education information will improve Description: Including pain rating scale, medication(s)/side effects and non-pharmacologic comfort measures Outcome: Progressing   Problem: Clinical Measurements: Goal: Respiratory complications will improve Outcome: Progressing Goal: Cardiovascular complication will be avoided Outcome: Progressing   Problem: Nutrition: Goal: Adequate nutrition will be maintained Outcome: Progressing   Problem: Coping: Goal: Level of anxiety will decrease Outcome: Progressing   Problem: Safety: Goal: Ability to remain free from injury will improve Outcome: Progressing

## 2020-10-20 NOTE — Progress Notes (Signed)
PROGRESS NOTE    [name]  BHA:[id number] DOB: 1955-12-19 DOA: 10/17/2020 PCP: Lillard Anes, MD     Brief Narrative:  [name] is a 65 y.o. female with medical history significant of arthritis, cardiomyopathy, chronic back pain, COPD, chronic narcotic use, chronic knee pains, chronic systolic congestive heart failure, dual ICD, hyperlipidemia, hypertension, GERD, major depressive disorder, PTSD, recurrent incisional hernia repair status post flap repair 03/15/2015 who presented with abdominal pain x3 weeks intermittent, associated with intermittent nausea but no vomiting. She was seen by PCP in [place] 10/16/20 and return for complete abdominal CT reading "Abnormal posterior, proximal sigmoid multiple colocolonic fistula and adhesion adjacent to a loop, wall thickening, consistent with acute diverticulitis or other infectious or inflammatory colitis."  She was started on empiric IV antibiotics and admitted to the hospital.  She remained hypotensive and was transferred to ICU for pressors.  PCCM, general surgery, GI consulted.  New events last 24 hours / Subjective: Still having some lower abdominal pain, has been tolerating clear liquids.  Assessment & Plan:   Principal Problem:   Sepsis ([place]) Active Problems:   COPD (chronic obstructive pulmonary disease) (HCC)   Cardiomyopathy (HCC)   Chronic back pain   GERD (gastroesophageal reflux disease)   Chronic systolic congestive heart failure, NYHA class 2 (HCC)   Dilated cardiomyopathy ([place])   ICD (implantable cardioverter-defibrillator) in place   Dyslipidemia   Essential hypertension   Hypotension   Abdominal pain   Acute diverticulitis   Colonic fistula   Septic shock secondary to acute diverticulitis as well as colocolonic fistula -Presented with sepsis along with endorgan damage including AKI, hypotension requiring pressors -Now off pressors, appreciate PCCM.  They have signed off -GI does not feel that  patient's presentation is typical of Crohn's/IBD.  Likely to be a complication from underlying progressive and severe diverticular disease.  Inflammatory markers were ordered.  Following peripherally at this time.   -General surgery did not feel patient needed acute surgical intervention currently.  Signed off. -CRP 115.26, sed rate 52 -Blood culture 2/22 single set showing staph epidermidis.  Likely to be a contaminant, however added vancomycin due to patient's presentation with shock.  Repeat blood cultures drawn 2/23 negative. Vanco discontinued  -Continue empiric cefepime, Flagyl -Solucortef --> wean to home Florinef -Advance to full liquid diet today  -WBC increased overnight, monitor closely, repeat labs in AM   Hypokalemia  -Replace  Chronic systolic CHF, cardiomyopathy with biventricular ICD in place -Previous EF 35 to 40% -Continue to monitor volume status closely -Holding Coreg, Entresto, Ranexa and Lasix due to shock  AKI -Baseline creatinine 1.1  -Resolved   Anxiety -Continue Valium, Zoloft, Topamax  Chronic pain -Continue oxycodone, neurontin  Hyperlipidemia -Continue Crestor    DVT prophylaxis:  Place and maintain sequential compression device Start: 10/19/20 0753 heparin injection 5,000 Units Start: 10/17/20 2200 TED hose Start: 10/17/20 1757 SCDs Start: 10/17/20 1757  Code Status: Full code Family Communication: No family at bedside Disposition Plan:  Status is: Inpatient  Remains inpatient appropriate because:IV treatments appropriate due to intensity of illness or inability to take PO   Dispo: The patient is from: Home              Anticipated d/c is to: Home              Anticipated d/c date is: 2 days              Patient currently is not medically stable to  d/c.  Remains on IV antibiotics.   Difficult to place patient No      Consultants:   PCCM  General surgery  GI  Procedures:   None   Antimicrobials:  Anti-infectives (From  admission, onward)   Start     Dose/Rate Route Frequency Ordered Stop   10/19/20 1400  vancomycin (VANCOREADY) IVPB 750 mg/150 mL  Status:  Discontinued        750 mg 150 mL/hr over 60 Minutes Intravenous Every 24 hours 10/18/20 1951 10/19/20 1159   10/18/20 1445  vancomycin (VANCOREADY) IVPB 1000 mg/200 mL        1,000 mg 200 mL/hr over 60 Minutes Intravenous  Once 10/18/20 1348 10/18/20 1536   10/18/20 0600  ceFEPIme (MAXIPIME) 2 g in sodium chloride 0.9 % 100 mL IVPB        2 g 200 mL/hr over 30 Minutes Intravenous Every 12 hours 10/17/20 1847     10/18/20 0100  metroNIDAZOLE (FLAGYL) IVPB 500 mg        500 mg 100 mL/hr over 60 Minutes Intravenous Every 8 hours 10/17/20 1802     10/17/20 1815  ceFEPIme (MAXIPIME) 2 g in sodium chloride 0.9 % 100 mL IVPB        2 g 200 mL/hr over 30 Minutes Intravenous  Once 10/17/20 1802 10/17/20 1902   10/17/20 1545  ciprofloxacin (CIPRO) IVPB 400 mg        400 mg 200 mL/hr over 60 Minutes Intravenous  Once 10/17/20 1544 10/17/20 1713   10/17/20 1545  metroNIDAZOLE (FLAGYL) IVPB 500 mg        500 mg 100 mL/hr over 60 Minutes Intravenous  Once 10/17/20 1544 10/17/20 1720       Objective: Vitals:   10/20/20 0217 10/20/20 0547 10/20/20 0712 10/20/20 1022  BP: 103/61 (!) 104/58  101/65  Pulse: 78 63  82  Resp: 20   20  Temp: 98.2 F (36.8 C) 98.1 F (36.7 C)  97.6 F (36.4 C)  TempSrc: Oral Oral  Oral  SpO2: 95% 95% 95% 100%  Weight:      Height:        Intake/Output Summary (Last 24 hours) at 10/20/2020 1031 Last data filed at 10/20/2020 0547 Gross per 24 hour  Intake 1191.45 ml  Output 1250 ml  Net -58.55 ml   Filed Weights   10/17/20 1050 10/18/20 0347 10/19/20 0500  Weight: 58.1 kg 61.1 kg 61.7 kg    Examination: General exam: Appears calm and comfortable  Respiratory system: Clear to auscultation. Respiratory effort normal. Cardiovascular system: S1 & S2 heard, RRR. No pedal edema. Gastrointestinal system: Abdomen is  nondistended, soft and tender to palpation lower quadrant Central nervous system: Alert and oriented. Non focal exam. Speech clear  Extremities: Symmetric in appearance bilaterally  Skin: No rashes, lesions or ulcers on exposed skin  Psychiatry: Judgement and insight appear stable. Mood & affect appropriate.    Data Reviewed: I have personally reviewed following labs and imaging studies  CBC: Recent Labs  Lab 10/17/20 1322 10/18/20 0252 10/19/20 0239 10/20/20 0446  WBC 15.3* 13.2* 11.5* 15.4*  NEUTROABS 12.1*  --   --   --   HGB 10.4* 9.6* 7.8* 9.2*  HCT 31.7* 30.0* 24.8* 28.2*  MCV 92.4 94.0 96.1 92.5  PLT 402* 389 325 341*   Basic Metabolic Panel: Recent Labs  Lab 10/17/20 1322 10/18/20 0252 10/19/20 0239 10/20/20 0446  NA 139 142 141 140  K 3.8 3.1* 3.9  3.4*  CL 105 114* 114* 114*  CO2 22 18* 17* 20*  GLUCOSE 113* 148* 111* 121*  BUN 31* 23 14 14   CREATININE 1.47* 1.09* 0.75 0.78  CALCIUM 8.8* 7.4* 7.3* 7.4*  MG  --   --  1.6* 3.0*   GFR: Estimated Creatinine Clearance: 56.8 mL/min (by C-G formula based on SCr of 0.78 mg/dL). Liver Function Tests: Recent Labs  Lab 10/17/20 1322  AST 14*  ALT 11  ALKPHOS 74  BILITOT 0.7  PROT 6.6  ALBUMIN 3.1*   No results for input(s): LIPASE, AMYLASE in the last 168 hours. No results for input(s): AMMONIA in the last 168 hours. Coagulation Profile: Recent Labs  Lab 10/18/20 0252  INR 1.0   Cardiac Enzymes: No results for input(s): CKTOTAL, CKMB, CKMBINDEX, TROPONINI in the last 168 hours. BNP (last 3 results) Recent Labs    12/02/19 1055 09/05/20 1138  PROBNP 1,044* 546*   HbA1C: No results for input(s): HGBA1C in the last 72 hours. CBG: Recent Labs  Lab 10/18/20 0723 10/19/20 0745 10/20/20 0739  GLUCAP 125* 102* 122*   Lipid Profile: No results for input(s): CHOL, HDL, LDLCALC, TRIG, CHOLHDL, LDLDIRECT in the last 72 hours. Thyroid Function Tests: No results for input(s): TSH, T4TOTAL, FREET4,  T3FREE, THYROIDAB in the last 72 hours. Anemia Panel: No results for input(s): VITAMINB12, FOLATE, FERRITIN, TIBC, IRON, RETICCTPCT in the last 72 hours. Sepsis Labs: Recent Labs  Lab 10/17/20 1322 10/17/20 2030  LATICACIDVEN 1.1 1.3    Recent Results (from the past 240 hour(s))  Culture, blood (routine x 2)     Status: Abnormal   Collection Time: 10/17/20  1:15 PM   Specimen: BLOOD RIGHT FOREARM  Result Value Ref Range Status   Specimen Description   Final    BLOOD RIGHT FOREARM Performed at [facility] [address], [place], [place] [address]    Special Requests   Final    BOTTLES DRAWN AEROBIC AND ANAEROBIC Blood Culture adequate volume Performed at [facility] [address] [place], [place] [address]    Culture  Setup Time   Final    GRAM POSITIVE COCCI IN CLUSTERS ANAEROBIC BOTTLE ONLY Organism ID to follow CRITICAL RESULT CALLED TO, READ BACK BY AND VERIFIED WITH: Sheffield Slider PHARMD [id number] 10/18/20 A BROWNING    Culture (A)  Final    STAPHYLOCOCCUS EPIDERMIDIS THE SIGNIFICANCE OF ISOLATING THIS ORGANISM FROM A SINGLE SET OF BLOOD CULTURES WHEN MULTIPLE SETS ARE DRAWN IS UNCERTAIN. PLEASE NOTIFY THE MICROBIOLOGY DEPARTMENT WITHIN ONE WEEK IF SPECIATION AND SENSITIVITIES ARE REQUIRED. Performed at [facility], [place] [address], [place], [place] [address]    Report Status 10/20/2020 FINAL  Final  Blood Culture ID Panel (Reflexed)     Status: Abnormal   Collection Time: 10/17/20  1:15 PM  Result Value Ref Range Status   Enterococcus faecalis NOT DETECTED NOT DETECTED Final   Enterococcus Faecium NOT DETECTED NOT DETECTED Final   Listeria monocytogenes NOT DETECTED NOT DETECTED Final   Staphylococcus species DETECTED (A) NOT DETECTED Final    Comment: CRITICAL RESULT CALLED TO, READ BACK BY AND VERIFIED WITH: Sheffield Slider PHARMD [id number] 10/18/20 A BROWNING    Staphylococcus aureus (BCID) NOT DETECTED NOT DETECTED Final    Staphylococcus epidermidis DETECTED (A) NOT DETECTED Final    Comment: Methicillin (oxacillin) resistant coagulase negative staphylococcus. Possible blood culture contaminant (unless isolated from more than one blood culture draw or clinical case suggests pathogenicity). No  antibiotic treatment is indicated for blood  culture contaminants. CRITICAL RESULT CALLED TO, READ BACK BY AND VERIFIED WITH: Sheffield Slider PHARMD [id number] 10/18/20 A BROWNING    Staphylococcus lugdunensis NOT DETECTED NOT DETECTED Final   Streptococcus species NOT DETECTED NOT DETECTED Final   Streptococcus agalactiae NOT DETECTED NOT DETECTED Final   Streptococcus pneumoniae NOT DETECTED NOT DETECTED Final   Streptococcus pyogenes NOT DETECTED NOT DETECTED Final   A.calcoaceticus-baumannii NOT DETECTED NOT DETECTED Final   Bacteroides fragilis NOT DETECTED NOT DETECTED Final   Enterobacterales NOT DETECTED NOT DETECTED Final   Enterobacter cloacae complex NOT DETECTED NOT DETECTED Final   Escherichia coli NOT DETECTED NOT DETECTED Final   Klebsiella aerogenes NOT DETECTED NOT DETECTED Final   Klebsiella oxytoca NOT DETECTED NOT DETECTED Final   Klebsiella pneumoniae NOT DETECTED NOT DETECTED Final   Proteus species NOT DETECTED NOT DETECTED Final   Salmonella species NOT DETECTED NOT DETECTED Final   Serratia marcescens NOT DETECTED NOT DETECTED Final   Haemophilus influenzae NOT DETECTED NOT DETECTED Final   Neisseria meningitidis NOT DETECTED NOT DETECTED Final   Pseudomonas aeruginosa NOT DETECTED NOT DETECTED Final   Stenotrophomonas maltophilia NOT DETECTED NOT DETECTED Final   Candida albicans NOT DETECTED NOT DETECTED Final   Candida auris NOT DETECTED NOT DETECTED Final   Candida glabrata NOT DETECTED NOT DETECTED Final   Candida krusei NOT DETECTED NOT DETECTED Final   Candida parapsilosis NOT DETECTED NOT DETECTED Final   Candida tropicalis NOT DETECTED NOT DETECTED Final   Cryptococcus neoformans/gattii NOT  DETECTED NOT DETECTED Final   Methicillin resistance mecA/C DETECTED (A) NOT DETECTED Final    Comment: CRITICAL RESULT CALLED TO, READ BACK BY AND VERIFIED WITH: Sheffield Slider [facility] [id number] 10/18/20 A BROWNING Performed at [facility], [address] [place], [place] [address]   Culture, blood (routine x 2)     Status: None (Preliminary result)   Collection Time: 10/17/20  1:22 PM   Specimen: BLOOD  Result Value Ref Range Status   Specimen Description   Final    BLOOD SITE NOT SPECIFIED Performed at [facility], [place] [address] [place], [place] [address]    Special Requests   Final    BOTTLES DRAWN AEROBIC AND ANAEROBIC Blood Culture results may not be optimal due to an inadequate volume of blood received in culture bottles Performed at [facility] [address] [place], [place] [address]    Culture   Final    NO GROWTH 2 DAYS Performed at [facility] [address], [place], [place] [address]    Report Status PENDING  Incomplete  SARS CORONAVIRUS 2 (TAT 6-24 HRS) Nasopharyngeal Nasopharyngeal Swab     Status: None   Collection Time: 10/17/20  5:24 PM   Specimen: Nasopharyngeal Swab  Result Value Ref Range Status   SARS Coronavirus 2 NEGATIVE NEGATIVE Final    Comment: (NOTE) SARS-CoV-2 target nucleic acids are NOT DETECTED.  The SARS-CoV-2 RNA is generally detectable in upper and lower respiratory specimens during the acute phase of infection. Negative results do not preclude SARS-CoV-2 infection, do not rule out co-infections with other pathogens, and should not be used as the sole basis for treatment or other patient management decisions. Negative results must be combined with clinical observations, patient history, and epidemiological information. The expected result is Negative.  Fact Sheet for Patients: SugarRoll.be  Fact Sheet for Healthcare  Providers: https://www.woods-mathews.com/  This test is not yet approved  or cleared by the [place] and  has been authorized for detection and/or diagnosis of SARS-CoV-2 by FDA under an Emergency Use Authorization (EUA). This EUA will remain  in effect (meaning this test can be used) for the duration of the COVID-19 declaration under Se ction 564(b)(1) of the Act, 21 U.S.C. section 360bbb-3(b)(1), unless the authorization is terminated or revoked sooner.  Performed at [facility], [place] [address] [place], [place] [address]   MRSA PCR Screening     Status: None   Collection Time: 10/17/20 10:46 PM   Specimen: Nasopharyngeal  Result Value Ref Range Status   MRSA by PCR NEGATIVE NEGATIVE Final    Comment:        The GeneXpert MRSA Assay (FDA approved for NASAL specimens only), is one component of a comprehensive MRSA colonization surveillance program. It is not intended to diagnose MRSA infection nor to guide or monitor treatment for MRSA infections. Performed at [facility], [place] [address], [place], [place] [address]   Culture, blood (Routine X 2) w Reflex to ID Panel     Status: None (Preliminary result)   Collection Time: 10/18/20  2:14 PM   Specimen: BLOOD RIGHT HAND  Result Value Ref Range Status   Specimen Description   Final    BLOOD RIGHT HAND Performed at [facility] [address], [place], [place] [address]    Special Requests   Final    BOTTLES DRAWN AEROBIC ONLY Blood Culture adequate volume Performed at [facility] [address] [place], [place] [address]    Culture   Final    NO GROWTH 1 DAY Performed at [facility], [place] [address], [place], [place] [address]    Report Status PENDING  Incomplete  Culture, blood (Routine X 2) w Reflex to ID Panel     Status: None (Preliminary result)   Collection Time: 10/18/20  2:14 PM   Specimen: BLOOD LEFT HAND  Result  Value Ref Range Status   Specimen Description   Final    BLOOD LEFT HAND Performed at [facility] [address] [place], [place] [address]    Special Requests   Final    BOTTLES DRAWN AEROBIC ONLY Blood Culture adequate volume Performed at [facility] [address] [place], [place] [address]    Culture   Final    NO GROWTH 1 DAY Performed at [facility], [place] [address], [place], [place] [address]    Report Status PENDING  Incomplete      Radiology Studies: No results found.    Scheduled Meds: . celecoxib  200 mg Oral q morning  . feeding supplement  1 Container Oral TID BM  . fludrocortisone  0.1 mg Oral Daily  . fluticasone furoate-vilanterol  1 puff Inhalation Q1500  . heparin  5,000 Units Subcutaneous Q8H  . mouth rinse  15 mL Mouth Rinse BID  . rosuvastatin  20 mg Oral q morning  . sertraline  50 mg Oral Daily  . sodium chloride flush  3 mL Intravenous Q12H  . sodium chloride flush  3 mL Intravenous Q12H  . topiramate  100 mg Oral QHS   Continuous Infusions: . sodium chloride    . sodium chloride Stopped (10/17/20 2313)  . ceFEPime (MAXIPIME) IV 2 g (10/20/20 0516)  . metronidazole 500 mg (10/20/20 0958)     LOS: 3 days      Time  spent: 25 minutes   Dessa Phi, DO [facility] 10/20/2020, 10:31 AM   Available via Epic secure chat 7am-7pm After these hours, please refer to coverage provider listed on [email or web address]

## 2020-10-20 NOTE — Progress Notes (Signed)
Physical Therapy Treatment Patient Details Name: [name] MRN: [id number] DOB: 07/28/1956 Today's Date: 10/20/2020    History of Present Illness 65 y.o. female with PMH of arthritis, cardiomyopathy, chronic back pain, COPD, chronic narcotic use, chronic knee pains, chronic systolic congestive heart failure, dual ICD, hyperlipidemia, hypertension, GERD, major depressive disorder, PTSD, recurrent incisional hernia repair status post flap repair 03/15/2015 . Presenting with abdominal pain x3 weeks. 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    PT Comments    Pt agreeable to mobilize and assisted OOB to chair.  Pt crying upon sitting in recliner, upset that she is still dizzy with mobility (see mobility section below for details).  Pt encouraged to stay up in recliner as tolerated today.   Follow Up Recommendations  Home health PT;Supervision for mobility/OOB     Equipment Recommendations  None recommended by PT    Recommendations for Other Services       Precautions / Restrictions Precautions Precautions: Fall Precaution Comments: monitor BP    Mobility  Bed Mobility Overal bed mobility: Needs Assistance Bed Mobility: Supine to Sit     Supine to sit: Min guard;HOB elevated     General bed mobility comments: increased time and effort, utilized bed rail to self assist    Transfers Overall transfer level: Needs assistance Equipment used: None Transfers: Sit to/from Stand Sit to Stand: Min guard         General transfer comment: min/guard for safety, pt used bed rail and armrests as support for transfer; pt crying upon sitting in recliner reporting she is still dizzy with mobility; BP 103/59 mmHg, HR 93 bpm, SpO2 100% on room air  Ambulation/Gait                 Stairs             Wheelchair Mobility    Modified Rankin (Stroke Patients Only)       Balance                                             Cognition Arousal/Alertness: Awake/alert Behavior During Therapy: WFL for tasks assessed/performed Overall Cognitive Status: Within Functional Limits for tasks assessed                                        Exercises      General Comments        Pertinent Vitals/Pain Pain Assessment: 0-10 Pain Score: 8  Pain Location: abdomen Pain Descriptors / Indicators: Aching;Sore Pain Intervention(s): Repositioned;Monitored during session;Patient requesting pain meds-RN notified    Home Living                      Prior Function            PT Goals (current goals can now be found in the care plan section) Progress towards PT goals: Progressing toward goals    Frequency    Min 3X/week      PT Plan Current plan remains appropriate    Co-evaluation              AM-PAC PT "6 Clicks" Mobility   Outcome Measure  Help needed turning from your back to your side while in a flat bed  without using bedrails?: A Little Help needed moving from lying on your back to sitting on the side of a flat bed without using bedrails?: A Little Help needed moving to and from a bed to a chair (including a wheelchair)?: A Little Help needed standing up from a chair using your arms (e.g., wheelchair or bedside chair)?: A Little Help needed to walk in hospital room?: A Lot Help needed climbing 3-5 steps with a railing? : A Lot 6 Click Score: 16    End of Session   Activity Tolerance: Patient limited by fatigue Patient left: in chair;with call bell/phone within reach;with chair alarm set   PT Visit Diagnosis: Unsteadiness on feet (R26.81);Difficulty in walking, not elsewhere classified (R26.2)     Time: 9166-0600 PT Time Calculation (min) (ACUTE ONLY): 12 min  Charges:  $Therapeutic Activity: 8-22 mins                     Jannette Spanner PT, DPT Acute Rehabilitation Services Pager: [phone] Office: [phone]  [place] Ram E 10/20/2020,  12:29 PM

## 2020-10-20 NOTE — Progress Notes (Signed)
Occupational Therapy Treatment Patient Details Name: [name] MRN: [id number] DOB: October 04, 1955 Today's Date: 10/20/2020    History of present illness 65 y.o. female with PMH of arthritis, cardiomyopathy, chronic back pain, COPD, chronic narcotic use, chronic knee pains, chronic systolic congestive heart failure, dual ICD, hyperlipidemia, hypertension, GERD, major depressive disorder, PTSD, recurrent incisional hernia repair status post flap repair 03/15/2015 . Presenting with abdominal pain x3 weeks. 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   OT comments  Pt progressing towards acute OT goals. Pt reports after PT session she sat in recliner for at least an hour and that after initial dizziness resolved tolerated well. Dizziness return quickly once she stood to get back in bed with nursing assist per her report. During OT session, pt completed pivotal steps EOB to/from [facility] and pericare in standing as detailed below. No r/o dizziness OOB this session. Pt did report increased abdominal pain after bowel movement. D/c plan remains appropriate.    Follow Up Recommendations  Home health OT;Supervision/Assistance - 24 hour;Other (comment) (24/7 initially)    Equipment Recommendations  Tub/shower seat    Recommendations for Other Services      Precautions / Restrictions Precautions Precautions: Fall Precaution Comments: monitor BP. orthostasis this admission Restrictions Weight Bearing Restrictions: No       Mobility Bed Mobility Overal bed mobility: Needs Assistance Bed Mobility: Supine to Sit;Sit to Supine Rolling: Min guard Sidelying to sit: Min guard;HOB elevated Supine to sit: Min guard;HOB elevated Sit to supine: Min guard   General bed mobility comments: extra time and effort, +bed rails. Fatigued at end of session returning to supine    Transfers Overall transfer level: Needs assistance Equipment used: None Transfers: Sit  to/from [facility] Sit to Stand: Min guard Stand pivot transfers: Min guard       General transfer comment: slow, guarded movements. seeks external support. to/from EOB and BSC. min guard for safety    Balance Overall balance assessment: Needs assistance Sitting-balance support: No upper extremity supported;Feet supported Sitting balance-Leahy Scale: Fair     Standing balance support: Bilateral upper extremity supported;During functional activity Standing balance-Leahy Scale: Poor Standing balance comment: Pt seeks external support taking pivotal steps EOB<>BSC. BUE support of rw in standing during pericare.                           ADL either performed or assessed with clinical judgement   ADL Overall ADL's : Needs assistance/impaired                         Toilet Transfer: Minimal assistance;Stand-pivot;BSC   Toileting- Clothing Manipulation and Hygiene: Moderate assistance;Sit to/from stand Toileting - Clothing Manipulation Details (indicate cue type and reason): BUE on rw utilized for dynamic standing balance support. total A for pericare task in standing     Functional mobility during ADLs: Minimal assistance (pivotal steps) General ADL Comments: Pt completed SPT <> BSC. Pericare in standing as detailed above. Pt tolerated OOB activity fairly well. Abdominal pain after bowel movement. Discussed importance of getting up to the chair (OOB) a few times a day to help improve activity tolerance and maintain strength.     Vision       Perception     Praxis      Cognition Arousal/Alertness: Awake/alert Behavior During Therapy: WFL for tasks assessed/performed Overall Cognitive Status: Within Functional Limits for tasks assessed  Exercises     Shoulder Instructions       General Comments      Pertinent Vitals/ Pain       Pain Assessment: Faces Pain Score: 8  Faces  Pain Scale: Hurts even more Pain Location: abdomen Pain Descriptors / Indicators: Cramping;Grimacing;Guarding Pain Intervention(s): Monitored during session;Repositioned  Home Living                                          Prior Functioning/Environment              Frequency  Min 2X/week        Progress Toward Goals  OT Goals(current goals can now be found in the care plan section)  Progress towards OT goals: Progressing toward goals  Acute Rehab OT Goals Patient Stated Goal: less pain, stop getting dizzy OOB. "I want to be able to walk to the bathroom." OT Goal Formulation: With patient Time For Goal Achievement: 11/01/20 Potential to Achieve Goals: Good ADL Goals Pt Will Perform Grooming: with supervision;sitting;standing Pt Will Perform Lower Body Dressing: with supervision;sit to/from stand;sitting/lateral leans;with adaptive equipment Pt Will Transfer to Toilet: with supervision;ambulating;regular height toilet Pt Will Perform Toileting - Clothing Manipulation and hygiene: with supervision;sitting/lateral leans;sit to/from stand  Plan Discharge plan remains appropriate    Co-evaluation                 AM-PAC OT "6 Clicks" Daily Activity     Outcome Measure   Help from another person eating meals?: None Help from another person taking care of personal grooming?: A Little Help from another person toileting, which includes using toliet, bedpan, or urinal?: A Little Help from another person bathing (including washing, rinsing, drying)?: A Lot Help from another person to put on and taking off regular upper body clothing?: A Little Help from another person to put on and taking off regular lower body clothing?: A Lot 6 Click Score: 17    End of Session Equipment Utilized During Treatment: Rolling walker (during pericare, BSC)  OT Visit Diagnosis: Unsteadiness on feet (R26.81);Pain   Activity Tolerance Patient limited by fatigue;Patient  tolerated treatment well   Patient Left in bed;with call bell/phone within reach;with nursing/sitter in room   Nurse Communication Other (comment) (pt had bowel movement during session. Nurse present at end of session.)        Time: 1225-1258 OT Time Calculation (min): 33 min  Charges: OT General Charges $OT Visit: 1 Visit OT Treatments $Self Care/Home Management : 23-37 mins  Tyrone Schimke, OT Acute Rehabilitation Services Pager: [phone] Office: [phone]    Hortencia Pilar 10/20/2020, 1:25 PM

## 2020-10-20 NOTE — Plan of Care (Signed)
  Problem: Education: Goal: Knowledge of General Education information will improve Description: Including pain rating scale, medication(s)/side effects and non-pharmacologic comfort measures Outcome: Progressing   Problem: Activity: Goal: Risk for activity intolerance will decrease Outcome: Progressing   Problem: Nutrition: Goal: Adequate nutrition will be maintained Outcome: Progressing   Problem: Coping: Goal: Level of anxiety will decrease Outcome: Progressing   Problem: Elimination: Goal: Will not experience complications related to bowel motility Outcome: Progressing   Problem: Pain Managment: Goal: General experience of comfort will improve Outcome: Progressing   Problem: Safety: Goal: Ability to remain free from injury will improve Outcome: Progressing   

## 2020-10-20 NOTE — Care Management Important Message (Signed)
Important Message  Patient Details IM Letter given to the Patient. Name: [name] MRN: [id number] Date of Birth: 1956/05/26   Medicare Important Message Given:  Yes     Kerin Salen 10/20/2020, 11:32 AM

## 2020-10-21 DIAGNOSIS — N179 Acute kidney failure, unspecified: Secondary | ICD-10-CM | POA: Diagnosis not present

## 2020-10-21 DIAGNOSIS — R6521 Severe sepsis with septic shock: Secondary | ICD-10-CM | POA: Diagnosis not present

## 2020-10-21 DIAGNOSIS — A419 Sepsis, unspecified organism: Secondary | ICD-10-CM | POA: Diagnosis not present

## 2020-10-21 LAB — CBC
HCT: 27.5 % — ABNORMAL LOW (ref 36.0–46.0)
Hemoglobin: 8.8 g/dL — ABNORMAL LOW (ref 12.0–15.0)
MCH: 30.3 pg (ref 26.0–34.0)
MCHC: 32 g/dL (ref 30.0–36.0)
MCV: 94.8 fL (ref 80.0–100.0)
Platelets: 397 10*3/uL (ref 150–400)
RBC: 2.9 MIL/uL — ABNORMAL LOW (ref 3.87–5.11)
RDW: 13.1 % (ref 11.5–15.5)
WBC: 14.4 10*3/uL — ABNORMAL HIGH (ref 4.0–10.5)
nRBC: 0 % (ref 0.0–0.2)

## 2020-10-21 LAB — BASIC METABOLIC PANEL
Anion gap: 6 (ref 5–15)
BUN: 14 mg/dL (ref 8–23)
CO2: 21 mmol/L — ABNORMAL LOW (ref 22–32)
Calcium: 7.5 mg/dL — ABNORMAL LOW (ref 8.9–10.3)
Chloride: 114 mmol/L — ABNORMAL HIGH (ref 98–111)
Creatinine, Ser: 0.69 mg/dL (ref 0.44–1.00)
GFR, Estimated: >60 mL/min (ref >60)
Glucose, Bld: 89 mg/dL (ref 70–99)
Potassium: 3.9 mmol/L (ref 3.5–5.1)
Sodium: 141 mmol/L (ref 135–145)

## 2020-10-21 LAB — GLUCOSE, CAPILLARY: Glucose-Capillary: 88 mg/dL (ref 70–99)

## 2020-10-21 MED ORDER — MECLIZINE HCL 25 MG PO TABS
12.5000 mg | ORAL_TABLET | Freq: Three times a day (TID) | ORAL | Status: DC | PRN
  Administered 2020-10-21 – 2020-10-25 (×5): 12.5 mg via ORAL
  Filled 2020-10-21 (×5): qty 1

## 2020-10-21 MED ORDER — OXYCODONE HCL 5 MG PO TABS
10.0000 mg | ORAL_TABLET | ORAL | Status: DC | PRN
Start: 2020-10-21 — End: 2020-10-29
  Administered 2020-10-21 – 2020-10-29 (×18): 10 mg via ORAL
  Filled 2020-10-21 (×21): qty 2

## 2020-10-21 NOTE — Plan of Care (Signed)
  Problem: Education: Goal: Knowledge of General Education information will improve Description: Including pain rating scale, medication(s)/side effects and non-pharmacologic comfort measures Outcome: Progressing   Problem: Clinical Measurements: Goal: Ability to maintain clinical measurements within normal limits will improve Outcome: Progressing   Problem: Coping: Goal: Level of anxiety will decrease Outcome: Progressing   

## 2020-10-21 NOTE — Progress Notes (Signed)
PROGRESS NOTE    [name]  SHF:[id number] DOB: Jun 12, 1956 DOA: 10/17/2020 PCP: Lillard Anes, MD     Brief Narrative:  [name] is a 66 y.o. female with medical history significant of arthritis, cardiomyopathy, chronic back pain, COPD, chronic narcotic use, chronic knee pains, chronic systolic congestive heart failure, dual ICD, hyperlipidemia, hypertension, GERD, major depressive disorder, PTSD, recurrent incisional hernia repair status post flap repair 03/15/2015 who presented with abdominal pain x3 weeks intermittent, associated with intermittent nausea but no vomiting. She was seen by PCP in [place] 10/16/20 and return for complete abdominal CT reading "Abnormal posterior, proximal sigmoid multiple colocolonic fistula and adhesion adjacent to a loop, wall thickening, consistent with acute diverticulitis or other infectious or inflammatory colitis."  She was started on empiric IV antibiotics and admitted to the hospital.  She remained hypotensive and was transferred to ICU for pressors.  PCCM, general surgery, GI consulted.  New events last 24 hours / Subjective: Was able to tolerate some full liquid diet yesterday, still having tenderness in her lower abdomen. Quite tearful today as she has had some dizziness/vertigo which has been intermittent in nature, worse with movement and associated with some nausea.   Assessment & Plan:   Principal Problem:   Sepsis ([place]) Active Problems:   COPD (chronic obstructive pulmonary disease) (HCC)   Cardiomyopathy (HCC)   Chronic back pain   GERD (gastroesophageal reflux disease)   Chronic systolic congestive heart failure, NYHA class 2 (HCC)   Dilated cardiomyopathy ([place])   ICD (implantable cardioverter-defibrillator) in place   Dyslipidemia   Essential hypertension   Hypotension   Abdominal pain   Acute diverticulitis   Colonic fistula   Septic shock secondary to acute diverticulitis as well as colocolonic  fistula -Presented with sepsis along with endorgan damage including AKI, hypotension requiring pressors -Now off pressors, appreciate PCCM.  They have signed off -GI does not feel that patient's presentation is typical of Crohn's/IBD.  Likely to be a complication from underlying progressive and severe diverticular disease.  Inflammatory markers were ordered.  Following peripherally at this time.   -General surgery did not feel patient needed acute surgical intervention currently.  Signed off. -CRP 115.26, sed rate 52 -Blood culture 2/22 single set showing staph epidermidis.  Likely to be a contaminant, however added vancomycin due to patient's presentation with shock.  Repeat blood cultures drawn 2/23 negative. Vanco discontinued  -Continue empiric cefepime, Flagyl -Solucortef --> weaned to home Florinef. BP remains low but stable  -Advance to soft diet today  -WBC trended down again  Chronic systolic CHF, cardiomyopathy with biventricular ICD in place -Previous EF 35 to 40% -Continue to monitor volume status closely -Holding Coreg, Entresto, Ranexa and Lasix due to shock -No acute sign of exacerbation or volume overload currently   AKI -Baseline creatinine 0.7  -Resolved   Anxiety -Continue Valium, Zoloft, Topamax  Chronic pain -Continue oxycodone, neurontin  Hyperlipidemia -Continue Crestor  Vertigo -Trial antivert PRN     DVT prophylaxis:  Place and maintain sequential compression device Start: 10/19/20 0753 heparin injection 5,000 Units Start: 10/17/20 2200 TED hose Start: 10/17/20 1757 SCDs Start: 10/17/20 1757  Code Status: Full code Family Communication: No family at bedside, spoke with significant other for updates over the phone  Disposition Plan:  Status is: Inpatient  Remains inpatient appropriate because:IV treatments appropriate due to intensity of illness or inability to take PO   Dispo: The patient is from: Home  Anticipated d/c is to:  Home              Anticipated d/c date is: 2 days              Patient currently is not medically stable to d/c.  Remains on IV antibiotics. Advance diet today.    Difficult to place patient No      Consultants:   PCCM  General surgery  GI  Procedures:   None   Antimicrobials:  Anti-infectives (From admission, onward)   Start     Dose/Rate Route Frequency Ordered Stop   10/19/20 1400  vancomycin (VANCOREADY) IVPB 750 mg/150 mL  Status:  Discontinued        750 mg 150 mL/hr over 60 Minutes Intravenous Every 24 hours 10/18/20 1951 10/19/20 1159   10/18/20 1445  vancomycin (VANCOREADY) IVPB 1000 mg/200 mL        1,000 mg 200 mL/hr over 60 Minutes Intravenous  Once 10/18/20 1348 10/18/20 1536   10/18/20 0600  ceFEPIme (MAXIPIME) 2 g in sodium chloride 0.9 % 100 mL IVPB        2 g 200 mL/hr over 30 Minutes Intravenous Every 12 hours 10/17/20 1847     10/18/20 0100  metroNIDAZOLE (FLAGYL) IVPB 500 mg        500 mg 100 mL/hr over 60 Minutes Intravenous Every 8 hours 10/17/20 1802     10/17/20 1815  ceFEPIme (MAXIPIME) 2 g in sodium chloride 0.9 % 100 mL IVPB        2 g 200 mL/hr over 30 Minutes Intravenous  Once 10/17/20 1802 10/17/20 1902   10/17/20 1545  ciprofloxacin (CIPRO) IVPB 400 mg        400 mg 200 mL/hr over 60 Minutes Intravenous  Once 10/17/20 1544 10/17/20 1713   10/17/20 1545  metroNIDAZOLE (FLAGYL) IVPB 500 mg        500 mg 100 mL/hr over 60 Minutes Intravenous  Once 10/17/20 1544 10/17/20 1720       Objective: Vitals:   10/20/20 1725 10/20/20 2113 10/21/20 0525 10/21/20 0800  BP: (!) 97/53 103/68 101/71   Pulse: 90 79 69   Resp: 20 16 16    Temp: (!) 97.5 F (36.4 C) 98.2 F (36.8 C) 98.2 F (36.8 C)   TempSrc: Oral Oral Oral   SpO2: 99% 100% 97% 99%  Weight:   66.5 kg   Height:        Intake/Output Summary (Last 24 hours) at 10/21/2020 0949 Last data filed at 10/21/2020 0400 Gross per 24 hour  Intake 860 ml  Output 400 ml  Net 460 ml    Filed Weights   10/18/20 0347 10/19/20 0500 10/21/20 0525  Weight: 61.1 kg 61.7 kg 66.5 kg    Examination: General exam: Appears calm and comfortable  Respiratory system: Clear to auscultation. Respiratory effort normal. Cardiovascular system: S1 & S2 heard, RRR. No pedal edema. Gastrointestinal system: Abdomen is nondistended, soft and tender to palpation lower abdomen Central nervous system: Alert and oriented. Non focal exam. Speech clear  Extremities: Symmetric in appearance bilaterally  Skin: No rashes, lesions or ulcers on exposed skin  Psychiatry: Judgement and insight appear stable. Mood & affect appropriate.  Somewhat tearful today   Data Reviewed: I have personally reviewed following labs and imaging studies  CBC: Recent Labs  Lab 10/17/20 1322 10/18/20 0252 10/19/20 0239 10/20/20 0446 10/21/20 0415  WBC 15.3* 13.2* 11.5* 15.4* 14.4*  NEUTROABS 12.1*  --   --   --   --  HGB 10.4* 9.6* 7.8* 9.2* 8.8*  HCT 31.7* 30.0* 24.8* 28.2* 27.5*  MCV 92.4 94.0 96.1 92.5 94.8  PLT 402* 389 325 413* 673   Basic Metabolic Panel: Recent Labs  Lab 10/17/20 1322 10/18/20 0252 10/19/20 0239 10/20/20 0446 10/21/20 0415  NA 139 142 141 140 141  K 3.8 3.1* 3.9 3.4* 3.9  CL 105 114* 114* 114* 114*  CO2 22 18* 17* 20* 21*  GLUCOSE 113* 148* 111* 121* 89  BUN 31* 23 14 14 14   CREATININE 1.47* 1.09* 0.75 0.78 0.69  CALCIUM 8.8* 7.4* 7.3* 7.4* 7.5*  MG  --   --  1.6* 3.0*  --    GFR: Estimated Creatinine Clearance: 58.9 mL/min (by C-G formula based on SCr of 0.69 mg/dL). Liver Function Tests: Recent Labs  Lab 10/17/20 1322  AST 14*  ALT 11  ALKPHOS 74  BILITOT 0.7  PROT 6.6  ALBUMIN 3.1*   No results for input(s): LIPASE, AMYLASE in the last 168 hours. No results for input(s): AMMONIA in the last 168 hours. Coagulation Profile: Recent Labs  Lab 10/18/20 0252  INR 1.0   Cardiac Enzymes: No results for input(s): CKTOTAL, CKMB, CKMBINDEX, TROPONINI in the  last 168 hours. BNP (last 3 results) Recent Labs    12/02/19 1055 09/05/20 1138  PROBNP 1,044* 546*   HbA1C: No results for input(s): HGBA1C in the last 72 hours. CBG: Recent Labs  Lab 10/18/20 0723 10/19/20 0745 10/20/20 0739 10/21/20 0744  GLUCAP 125* 102* 122* 88   Lipid Profile: No results for input(s): CHOL, HDL, LDLCALC, TRIG, CHOLHDL, LDLDIRECT in the last 72 hours. Thyroid Function Tests: No results for input(s): TSH, T4TOTAL, FREET4, T3FREE, THYROIDAB in the last 72 hours. Anemia Panel: No results for input(s): VITAMINB12, FOLATE, FERRITIN, TIBC, IRON, RETICCTPCT in the last 72 hours. Sepsis Labs: Recent Labs  Lab 10/17/20 1322 10/17/20 2030  LATICACIDVEN 1.1 1.3    Recent Results (from the past 240 hour(s))  Culture, blood (routine x 2)     Status: Abnormal   Collection Time: 10/17/20  1:15 PM   Specimen: BLOOD RIGHT FOREARM  Result Value Ref Range Status   Specimen Description   Final    BLOOD RIGHT FOREARM Performed at [facility] [address] [place], [place] [address]    Special Requests   Final    BOTTLES DRAWN AEROBIC AND ANAEROBIC Blood Culture adequate volume Performed at [facility] [address] [place], [place] [address]    Culture  Setup Time   Final    GRAM POSITIVE COCCI IN CLUSTERS ANAEROBIC BOTTLE ONLY Organism ID to follow CRITICAL RESULT CALLED TO, READ BACK BY AND VERIFIED WITH: Sheffield Slider PHARMD [id number] 10/18/20 A BROWNING    Culture (A)  Final    STAPHYLOCOCCUS EPIDERMIDIS THE SIGNIFICANCE OF ISOLATING THIS ORGANISM FROM A SINGLE SET OF BLOOD CULTURES WHEN MULTIPLE SETS ARE DRAWN IS UNCERTAIN. PLEASE NOTIFY THE MICROBIOLOGY DEPARTMENT WITHIN ONE WEEK IF SPECIATION AND SENSITIVITIES ARE REQUIRED. Performed at [facility], [place] [address], [place], [place] [address]    Report Status 10/20/2020 FINAL  Final  Blood Culture ID Panel (Reflexed)     Status: Abnormal   Collection Time:  10/17/20  1:15 PM  Result Value Ref Range Status   Enterococcus faecalis NOT DETECTED NOT DETECTED Final   Enterococcus Faecium NOT DETECTED NOT DETECTED Final   Listeria monocytogenes NOT DETECTED NOT DETECTED Final   Staphylococcus species DETECTED (A) NOT DETECTED  Final    Comment: CRITICAL RESULT CALLED TO, READ BACK BY AND VERIFIED WITH: Sheffield Slider PHARMD [id number] 10/18/20 A BROWNING    Staphylococcus aureus (BCID) NOT DETECTED NOT DETECTED Final   Staphylococcus epidermidis DETECTED (A) NOT DETECTED Final    Comment: Methicillin (oxacillin) resistant coagulase negative staphylococcus. Possible blood culture contaminant (unless isolated from more than one blood culture draw or clinical case suggests pathogenicity). No antibiotic treatment is indicated for blood  culture contaminants. CRITICAL RESULT CALLED TO, READ BACK BY AND VERIFIED WITH: Sheffield Slider PHARMD [id number] 10/18/20 A BROWNING    Staphylococcus lugdunensis NOT DETECTED NOT DETECTED Final   Streptococcus species NOT DETECTED NOT DETECTED Final   Streptococcus agalactiae NOT DETECTED NOT DETECTED Final   Streptococcus pneumoniae NOT DETECTED NOT DETECTED Final   Streptococcus pyogenes NOT DETECTED NOT DETECTED Final   A.calcoaceticus-baumannii NOT DETECTED NOT DETECTED Final   Bacteroides fragilis NOT DETECTED NOT DETECTED Final   Enterobacterales NOT DETECTED NOT DETECTED Final   Enterobacter cloacae complex NOT DETECTED NOT DETECTED Final   Escherichia coli NOT DETECTED NOT DETECTED Final   Klebsiella aerogenes NOT DETECTED NOT DETECTED Final   Klebsiella oxytoca NOT DETECTED NOT DETECTED Final   Klebsiella pneumoniae NOT DETECTED NOT DETECTED Final   Proteus species NOT DETECTED NOT DETECTED Final   Salmonella species NOT DETECTED NOT DETECTED Final   Serratia marcescens NOT DETECTED NOT DETECTED Final   Haemophilus influenzae NOT DETECTED NOT DETECTED Final   Neisseria meningitidis NOT DETECTED NOT DETECTED Final    Pseudomonas aeruginosa NOT DETECTED NOT DETECTED Final   Stenotrophomonas maltophilia NOT DETECTED NOT DETECTED Final   Candida albicans NOT DETECTED NOT DETECTED Final   Candida auris NOT DETECTED NOT DETECTED Final   Candida glabrata NOT DETECTED NOT DETECTED Final   Candida krusei NOT DETECTED NOT DETECTED Final   Candida parapsilosis NOT DETECTED NOT DETECTED Final   Candida tropicalis NOT DETECTED NOT DETECTED Final   Cryptococcus neoformans/gattii NOT DETECTED NOT DETECTED Final   Methicillin resistance mecA/C DETECTED (A) NOT DETECTED Final    Comment: CRITICAL RESULT CALLED TO, READ BACK BY AND VERIFIED WITH: Sheffield Slider [facility] [id number] 10/18/20 A BROWNING Performed at [facility], [address] [place], [place] [address]   Culture, blood (routine x 2)     Status: None (Preliminary result)   Collection Time: 10/17/20  1:22 PM   Specimen: BLOOD  Result Value Ref Range Status   Specimen Description   Final    BLOOD SITE NOT SPECIFIED Performed at [facility], [place] [address], [place], [place] [address]    Special Requests   Final    BOTTLES DRAWN AEROBIC AND ANAEROBIC Blood Culture results may not be optimal due to an inadequate volume of blood received in culture bottles Performed at [facility] [address] [place], [place] [address]    Culture   Final    NO GROWTH 3 DAYS Performed at [facility], [place] [address], [place], [place] [address]    Report Status PENDING  Incomplete  SARS CORONAVIRUS 2 (TAT 6-24 HRS) Nasopharyngeal Nasopharyngeal Swab     Status: None   Collection Time: 10/17/20  5:24 PM   Specimen: Nasopharyngeal Swab  Result Value Ref Range Status   SARS Coronavirus 2 NEGATIVE NEGATIVE Final    Comment: (NOTE) SARS-CoV-2 target nucleic acids are NOT DETECTED.  The SARS-CoV-2 RNA is generally detectable in upper and lower respiratory specimens during the acute phase of  infection. Negative results do not preclude  SARS-CoV-2 infection, do not rule out co-infections with other pathogens, and should not be used as the sole basis for treatment or other patient management decisions. Negative results must be combined with clinical observations, patient history, and epidemiological information. The expected result is Negative.  Fact Sheet for Patients: SugarRoll.be  Fact Sheet for Healthcare Providers: https://www.woods-mathews.com/  This test is not yet approved or cleared by the [place] FDA and  has been authorized for detection and/or diagnosis of SARS-CoV-2 by FDA under an Emergency Use Authorization (EUA). This EUA will remain  in effect (meaning this test can be used) for the duration of the COVID-19 declaration under Se ction 564(b)(1) of the Act, 21 U.S.C. section 360bbb-3(b)(1), unless the authorization is terminated or revoked sooner.  Performed at [facility], [place] [address], [place], [place] [address]   MRSA PCR Screening     Status: None   Collection Time: 10/17/20 10:46 PM   Specimen: Nasopharyngeal  Result Value Ref Range Status   MRSA by PCR NEGATIVE NEGATIVE Final    Comment:        The GeneXpert MRSA Assay (FDA approved for NASAL specimens only), is one component of a comprehensive MRSA colonization surveillance program. It is not intended to diagnose MRSA infection nor to guide or monitor treatment for MRSA infections. Performed at [facility], [place] [address], [place], [place] [address]   Culture, blood (Routine X 2) w Reflex to ID Panel     Status: None (Preliminary result)   Collection Time: 10/18/20  2:14 PM   Specimen: BLOOD RIGHT HAND  Result Value Ref Range Status   Specimen Description   Final    BLOOD RIGHT HAND Performed at [facility] [address] [place], [place] [address]    Special Requests   Final    BOTTLES DRAWN AEROBIC ONLY Blood Culture adequate  volume Performed at [facility] [address], [place], [place] [address]    Culture   Final    NO GROWTH 2 DAYS Performed at [facility] [address] [place], [place] [address]    Report Status PENDING  Incomplete  Culture, blood (Routine X 2) w Reflex to ID Panel     Status: None (Preliminary result)   Collection Time: 10/18/20  2:14 PM   Specimen: BLOOD LEFT HAND  Result Value Ref Range Status   Specimen Description   Final    BLOOD LEFT HAND Performed at [facility] [address] [place], [place] [address]    Special Requests   Final    BOTTLES DRAWN AEROBIC ONLY Blood Culture adequate volume Performed at [facility] [address] [place],  [address]    Culture   Final    NO GROWTH 2 DAYS Performed at [facility] [address], [place],  [address]    Report Status PENDING  Incomplete      Radiology Studies: No results found.    Scheduled Meds: . celecoxib  200 mg Oral q morning  . feeding supplement  1 Container Oral TID BM  . fludrocortisone  0.1 mg Oral Daily  . fluticasone furoate-vilanterol  1 puff Inhalation Q1500  . heparin  5,000 Units Subcutaneous Q8H  . mouth rinse  15 mL Mouth Rinse BID  . rosuvastatin  20 mg Oral q morning  . sertraline  50 mg Oral Daily  .  sodium chloride flush  3 mL Intravenous Q12H  . sodium chloride flush  3 mL Intravenous Q12H  . topiramate  100 mg Oral QHS   Continuous Infusions: . sodium chloride    . sodium chloride Stopped (10/17/20 2313)  . ceFEPime (MAXIPIME) IV 2 g (10/21/20 0534)  . metronidazole 500 mg (10/21/20 0843)     LOS: 4 days      Time spent: 25 minutes   Dessa Phi, DO [facility] 10/21/2020, 9:49 AM   Available via Epic secure chat 7am-7pm After these hours, please refer to coverage provider listed on [email or web address]

## 2020-10-21 NOTE — Plan of Care (Signed)

## 2020-10-22 DIAGNOSIS — A419 Sepsis, unspecified organism: Secondary | ICD-10-CM | POA: Diagnosis not present

## 2020-10-22 DIAGNOSIS — N179 Acute kidney failure, unspecified: Secondary | ICD-10-CM | POA: Diagnosis not present

## 2020-10-22 DIAGNOSIS — R6521 Severe sepsis with septic shock: Secondary | ICD-10-CM | POA: Diagnosis not present

## 2020-10-22 LAB — BASIC METABOLIC PANEL
Anion gap: 5 (ref 5–15)
BUN: 14 mg/dL (ref 8–23)
CO2: 22 mmol/L (ref 22–32)
Calcium: 8.1 mg/dL — ABNORMAL LOW (ref 8.9–10.3)
Chloride: 114 mmol/L — ABNORMAL HIGH (ref 98–111)
Creatinine, Ser: 0.69 mg/dL (ref 0.44–1.00)
GFR, Estimated: >60 mL/min (ref >60)
Glucose, Bld: 89 mg/dL (ref 70–99)
Potassium: 4.4 mmol/L (ref 3.5–5.1)
Sodium: 141 mmol/L (ref 135–145)

## 2020-10-22 LAB — CULTURE, BLOOD (ROUTINE X 2): Culture: NO GROWTH

## 2020-10-22 LAB — CBC
HCT: 30 % — ABNORMAL LOW (ref 36.0–46.0)
Hemoglobin: 9.7 g/dL — ABNORMAL LOW (ref 12.0–15.0)
MCH: 29.8 pg (ref 26.0–34.0)
MCHC: 32.3 g/dL (ref 30.0–36.0)
MCV: 92 fL (ref 80.0–100.0)
Platelets: 452 10*3/uL — ABNORMAL HIGH (ref 150–400)
RBC: 3.26 MIL/uL — ABNORMAL LOW (ref 3.87–5.11)
RDW: 13.1 % (ref 11.5–15.5)
WBC: 15 10*3/uL — ABNORMAL HIGH (ref 4.0–10.5)
nRBC: 0 % (ref 0.0–0.2)

## 2020-10-22 LAB — GLUCOSE, CAPILLARY: Glucose-Capillary: 91 mg/dL (ref 70–99)

## 2020-10-22 NOTE — Progress Notes (Signed)
PROGRESS NOTE    [name]  TDD:[id number] DOB: June 11, 1956 DOA: 10/17/2020 PCP: Lillard Anes, MD     Brief Narrative:  [name] is a 65 y.o. female with medical history significant of arthritis, cardiomyopathy, chronic back pain, COPD, chronic narcotic use, chronic knee pains, chronic systolic congestive heart failure, dual ICD, hyperlipidemia, hypertension, GERD, major depressive disorder, PTSD, recurrent incisional hernia repair status post flap repair 03/15/2015 who presented with abdominal pain x3 weeks intermittent, associated with intermittent nausea but no vomiting. She was seen by PCP in [place] 10/16/20 and return for complete abdominal CT reading "Abnormal posterior, proximal sigmoid multiple colocolonic fistula and adhesion adjacent to a loop, wall thickening, consistent with acute diverticulitis or other infectious or inflammatory colitis."  She was started on empiric IV antibiotics and admitted to the hospital.  She remained hypotensive and was transferred to ICU for pressors.  PCCM, general surgery, GI consulted.  Patient continued to have clinical improvement.  Weaned off pressors.  She was resumed on her home [place].  New events last 24 hours / Subjective: Tolerating some soft foods, still having some tenderness in her lower abdomen.  WBC 15 today.  No fevers.  Assessment & Plan:   Principal Problem:   Sepsis ([place]) Active Problems:   COPD (chronic obstructive pulmonary disease) (HCC)   Cardiomyopathy (HCC)   Chronic back pain   GERD (gastroesophageal reflux disease)   Chronic systolic congestive heart failure, NYHA class 2 (HCC)   Dilated cardiomyopathy ([place])   ICD (implantable cardioverter-defibrillator) in place   Dyslipidemia   Essential hypertension   Hypotension   Abdominal pain   Acute diverticulitis   Colonic fistula   Septic shock secondary to acute diverticulitis as well as colocolonic fistula -Presented with sepsis along with endorgan  damage including AKI, hypotension requiring pressors -Now off pressors, appreciate PCCM.  They have signed off -GI does not feel that patient's presentation is typical of Crohn's/IBD.  Likely to be a complication from underlying progressive and severe diverticular disease.  Inflammatory markers were ordered.  Following peripherally at this time.   -General surgery did not feel patient needed acute surgical intervention currently.  Signed off. -CRP 115.26, sed rate 52 --> repeat in the morning -Blood culture 2/22 single set showing staph epidermidis.  Likely to be a contaminant, however added vancomycin due to patient's presentation with shock.  Repeat blood cultures drawn 2/23 negative. Vanco discontinued  -Continue empiric cefepime, Flagyl -Solucortef --> weaned to home [place]. BP remains low but stable  -Continue to monitor WBC, ?Repeat imaging  Chronic systolic CHF, cardiomyopathy with biventricular ICD in place -Previous EF 35 to 40% -Continue to monitor volume status closely -Holding Coreg, Entresto, Ranexa and Lasix due to shock -No acute sign of exacerbation or volume overload currently   AKI -Baseline creatinine 0.7  -Resolved   Anxiety -Continue Valium, Zoloft, Topamax  Chronic pain -Continue oxycodone, neurontin  Hyperlipidemia -Continue Crestor  Vertigo -Trial antivert PRN     DVT prophylaxis:  Place and maintain sequential compression device Start: 10/19/20 0753 heparin injection 5,000 Units Start: 10/17/20 2200 TED hose Start: 10/17/20 1757 SCDs Start: 10/17/20 1757  Code Status: Full code Family Communication: No family at bedside Disposition Plan:  Status is: Inpatient  Remains inpatient appropriate because:IV treatments appropriate due to intensity of illness or inability to take PO   Dispo: The patient is from: Home              Anticipated d/c is to: Home  Anticipated d/c date is: 2 days              Patient currently is not medically  stable to d/c.  Remains on IV antibiotics.  Monitor WBC.   Difficult to place patient No      Consultants:   PCCM  General surgery  GI  Procedures:   None   Antimicrobials:  Anti-infectives (From admission, onward)   Start     Dose/Rate Route Frequency Ordered Stop   10/19/20 1400  vancomycin (VANCOREADY) IVPB 750 mg/150 mL  Status:  Discontinued        750 mg 150 mL/hr over 60 Minutes Intravenous Every 24 hours 10/18/20 1951 10/19/20 1159   10/18/20 1445  vancomycin (VANCOREADY) IVPB 1000 mg/200 mL        1,000 mg 200 mL/hr over 60 Minutes Intravenous  Once 10/18/20 1348 10/18/20 1536   10/18/20 0600  ceFEPIme (MAXIPIME) 2 g in sodium chloride 0.9 % 100 mL IVPB        2 g 200 mL/hr over 30 Minutes Intravenous Every 12 hours 10/17/20 1847     10/18/20 0100  metroNIDAZOLE (FLAGYL) IVPB 500 mg        500 mg 100 mL/hr over 60 Minutes Intravenous Every 8 hours 10/17/20 1802     10/17/20 1815  ceFEPIme (MAXIPIME) 2 g in sodium chloride 0.9 % 100 mL IVPB        2 g 200 mL/hr over 30 Minutes Intravenous  Once 10/17/20 1802 10/17/20 1902   10/17/20 1545  ciprofloxacin (CIPRO) IVPB 400 mg        400 mg 200 mL/hr over 60 Minutes Intravenous  Once 10/17/20 1544 10/17/20 1713   10/17/20 1545  metroNIDAZOLE (FLAGYL) IVPB 500 mg        500 mg 100 mL/hr over 60 Minutes Intravenous  Once 10/17/20 1544 10/17/20 1720       Objective: Vitals:   10/22/20 0039 10/22/20 0500 10/22/20 0609 10/22/20 0738  BP: 105/71  109/68   Pulse: 61  64   Resp: 18     Temp: 98.2 F (36.8 C)  97.7 F (36.5 C)   TempSrc: Oral  Oral   SpO2: 96%  98% 99%  Weight:  64.6 kg    Height:        Intake/Output Summary (Last 24 hours) at 10/22/2020 1043 Last data filed at 10/22/2020 0610 Gross per 24 hour  Intake 1600.06 ml  Output 1200 ml  Net 400.06 ml   Filed Weights   10/19/20 0500 10/21/20 0525 10/22/20 0500  Weight: 61.7 kg 66.5 kg 64.6 kg    Examination: General exam: Appears calm and  comfortable  Respiratory system: Clear to auscultation. Respiratory effort normal. Cardiovascular system: S1 & S2 heard, RRR. No pedal edema. Gastrointestinal system: Abdomen is nondistended, soft and tender to palpation lower abdomen Central nervous system: Alert and oriented. Non focal exam. Speech clear  Extremities: Symmetric in appearance bilaterally  Skin: No rashes, lesions or ulcers on exposed skin  Psychiatry: Judgement and insight appear stable. Mood & affect appropriate.    Data Reviewed: I have personally reviewed following labs and imaging studies  CBC: Recent Labs  Lab 10/17/20 1322 10/18/20 0252 10/19/20 0239 10/20/20 0446 10/21/20 0415 10/22/20 0523  WBC 15.3* 13.2* 11.5* 15.4* 14.4* 15.0*  NEUTROABS 12.1*  --   --   --   --   --   HGB 10.4* 9.6* 7.8* 9.2* 8.8* 9.7*  HCT 31.7* 30.0* 24.8* 28.2*  27.5* 30.0*  MCV 92.4 94.0 96.1 92.5 94.8 92.0  PLT 402* 389 325 413* 397 712*   Basic Metabolic Panel: Recent Labs  Lab 10/18/20 0252 10/19/20 0239 10/20/20 0446 10/21/20 0415 10/22/20 0523  NA 142 141 140 141 141  K 3.1* 3.9 3.4* 3.9 4.4  CL 114* 114* 114* 114* 114*  CO2 18* 17* 20* 21* 22  GLUCOSE 148* 111* 121* 89 89  BUN 23 14 14 14 14   CREATININE 1.09* 0.75 0.78 0.69 0.69  CALCIUM 7.4* 7.3* 7.4* 7.5* 8.1*  MG  --  1.6* 3.0*  --   --    GFR: Estimated Creatinine Clearance: 58 mL/min (by C-G formula based on SCr of 0.69 mg/dL). Liver Function Tests: Recent Labs  Lab 10/17/20 1322  AST 14*  ALT 11  ALKPHOS 74  BILITOT 0.7  PROT 6.6  ALBUMIN 3.1*   No results for input(s): LIPASE, AMYLASE in the last 168 hours. No results for input(s): AMMONIA in the last 168 hours. Coagulation Profile: Recent Labs  Lab 10/18/20 0252  INR 1.0   Cardiac Enzymes: No results for input(s): CKTOTAL, CKMB, CKMBINDEX, TROPONINI in the last 168 hours. BNP (last 3 results) Recent Labs    12/02/19 1055 09/05/20 1138  PROBNP 1,044* 546*   HbA1C: No results for  input(s): HGBA1C in the last 72 hours. CBG: Recent Labs  Lab 10/18/20 0723 10/19/20 0745 10/20/20 0739 10/21/20 0744 10/22/20 0731  GLUCAP 125* 102* 122* 88 91   Lipid Profile: No results for input(s): CHOL, HDL, LDLCALC, TRIG, CHOLHDL, LDLDIRECT in the last 72 hours. Thyroid Function Tests: No results for input(s): TSH, T4TOTAL, FREET4, T3FREE, THYROIDAB in the last 72 hours. Anemia Panel: No results for input(s): VITAMINB12, FOLATE, FERRITIN, TIBC, IRON, RETICCTPCT in the last 72 hours. Sepsis Labs: Recent Labs  Lab 10/17/20 1322 10/17/20 2030  LATICACIDVEN 1.1 1.3    Recent Results (from the past 240 hour(s))  Culture, blood (routine x 2)     Status: Abnormal   Collection Time: 10/17/20  1:15 PM   Specimen: BLOOD RIGHT FOREARM  Result Value Ref Range Status   Specimen Description   Final    BLOOD RIGHT FOREARM Performed at [facility] [address], [place], [place] [address]    Special Requests   Final    BOTTLES DRAWN AEROBIC AND ANAEROBIC Blood Culture adequate volume Performed at [facility] [address], [place], [place] [address]    Culture  Setup Time   Final    GRAM POSITIVE COCCI IN CLUSTERS ANAEROBIC BOTTLE ONLY Organism ID to follow CRITICAL RESULT CALLED TO, READ BACK BY AND VERIFIED WITH: Sheffield Slider PHARMD [id number] 10/18/20 A BROWNING    Culture (A)  Final    STAPHYLOCOCCUS EPIDERMIDIS THE SIGNIFICANCE OF ISOLATING THIS ORGANISM FROM A SINGLE SET OF BLOOD CULTURES WHEN MULTIPLE SETS ARE DRAWN IS UNCERTAIN. PLEASE NOTIFY THE MICROBIOLOGY DEPARTMENT WITHIN ONE WEEK IF SPECIATION AND SENSITIVITIES ARE REQUIRED. Performed at [facility], [place] [address] [place],  [address]    Report Status 10/20/2020 FINAL  Final  Blood Culture ID Panel (Reflexed)     Status: Abnormal   Collection Time: 10/17/20  1:15 PM  Result Value Ref Range Status   Enterococcus faecalis NOT DETECTED NOT DETECTED Final    Enterococcus Faecium NOT DETECTED NOT DETECTED Final   Listeria monocytogenes NOT DETECTED NOT DETECTED Final   Staphylococcus species DETECTED (A) NOT DETECTED Final    Comment: CRITICAL  RESULT CALLED TO, READ BACK BY AND VERIFIED WITH: [place] Slider PHARMD [id number] 10/18/20 A BROWNING    Staphylococcus aureus (BCID) NOT DETECTED NOT DETECTED Final   Staphylococcus epidermidis DETECTED (A) NOT DETECTED Final    Comment: Methicillin (oxacillin) resistant coagulase negative staphylococcus. Possible blood culture contaminant (unless isolated from more than one blood culture draw or clinical case suggests pathogenicity). No antibiotic treatment is indicated for blood  culture contaminants. CRITICAL RESULT CALLED TO, READ BACK BY AND VERIFIED WITH: [place] Slider PHARMD [id number] 10/18/20 A BROWNING    Staphylococcus lugdunensis NOT DETECTED NOT DETECTED Final   Streptococcus species NOT DETECTED NOT DETECTED Final   Streptococcus agalactiae NOT DETECTED NOT DETECTED Final   Streptococcus pneumoniae NOT DETECTED NOT DETECTED Final   Streptococcus pyogenes NOT DETECTED NOT DETECTED Final   A.calcoaceticus-baumannii NOT DETECTED NOT DETECTED Final   Bacteroides fragilis NOT DETECTED NOT DETECTED Final   Enterobacterales NOT DETECTED NOT DETECTED Final   Enterobacter cloacae complex NOT DETECTED NOT DETECTED Final   Escherichia coli NOT DETECTED NOT DETECTED Final   Klebsiella aerogenes NOT DETECTED NOT DETECTED Final   Klebsiella oxytoca NOT DETECTED NOT DETECTED Final   Klebsiella pneumoniae NOT DETECTED NOT DETECTED Final   Proteus species NOT DETECTED NOT DETECTED Final   Salmonella species NOT DETECTED NOT DETECTED Final   Serratia marcescens NOT DETECTED NOT DETECTED Final   Haemophilus influenzae NOT DETECTED NOT DETECTED Final   Neisseria meningitidis NOT DETECTED NOT DETECTED Final   Pseudomonas aeruginosa NOT DETECTED NOT DETECTED Final   Stenotrophomonas maltophilia NOT DETECTED NOT DETECTED  Final   Candida albicans NOT DETECTED NOT DETECTED Final   Candida auris NOT DETECTED NOT DETECTED Final   Candida glabrata NOT DETECTED NOT DETECTED Final   Candida krusei NOT DETECTED NOT DETECTED Final   Candida parapsilosis NOT DETECTED NOT DETECTED Final   Candida tropicalis NOT DETECTED NOT DETECTED Final   Cryptococcus neoformans/gattii NOT DETECTED NOT DETECTED Final   Methicillin resistance mecA/C DETECTED (A) NOT DETECTED Final    Comment: CRITICAL RESULT CALLED TO, READ BACK BY AND VERIFIED WITH: Sheffield Slider [facility] [id number] 10/18/20 A BROWNING Performed at [facility], [address] [place], [place] [address]   Culture, blood (routine x 2)     Status: None (Preliminary result)   Collection Time: 10/17/20  1:22 PM   Specimen: BLOOD  Result Value Ref Range Status   Specimen Description   Final    BLOOD SITE NOT SPECIFIED Performed at [facility], [place] [address] [place] [address], [place], [place] [address]    Special Requests   Final    BOTTLES DRAWN AEROBIC AND ANAEROBIC Blood Culture results may not be optimal due to an inadequate volume of blood received in culture bottles Performed at [facility] [address], [place], [place] [address]    Culture   Final    NO GROWTH 4 DAYS Performed at [facility], [place] [address], [place], [place] [address]    Report Status PENDING  Incomplete  SARS CORONAVIRUS 2 (TAT 6-24 HRS) Nasopharyngeal Nasopharyngeal Swab     Status: None   Collection Time: 10/17/20  5:24 PM   Specimen: Nasopharyngeal Swab  Result Value Ref Range Status   SARS Coronavirus 2 NEGATIVE NEGATIVE Final    Comment: (NOTE) SARS-CoV-2 target nucleic acids are NOT DETECTED.  The SARS-CoV-2 RNA is generally detectable in upper and lower respiratory specimens during the acute phase of infection. Negative results do not preclude  SARS-CoV-2 infection, do not rule out co-infections with other pathogens, and should not be used as the sole basis  for treatment or other patient management decisions. Negative results must be combined with clinical observations, patient history, and epidemiological information. The expected result is Negative.  Fact Sheet for Patients: SugarRoll.be  Fact Sheet for Healthcare Providers: https://www.woods-mathews.com/  This test is not yet approved or cleared by the [place] FDA and  has been authorized for detection and/or diagnosis of SARS-CoV-2 by FDA under an Emergency Use Authorization (EUA). This EUA will remain  in effect (meaning this test can be used) for the duration of the COVID-19 declaration under Se ction 564(b)(1) of the Act, 21 U.S.C. section 360bbb-3(b)(1), unless the authorization is terminated or revoked sooner.  Performed at [facility], [place] [address], [place], [place] [address]   MRSA PCR Screening     Status: None   Collection Time: 10/17/20 10:46 PM   Specimen: Nasopharyngeal  Result Value Ref Range Status   MRSA by PCR NEGATIVE NEGATIVE Final    Comment:        The GeneXpert MRSA Assay (FDA approved for NASAL specimens only), is one component of a comprehensive MRSA colonization surveillance program. It is not intended to diagnose MRSA infection nor to guide or monitor treatment for MRSA infections. Performed at [facility], [place] [address] [place], [place], [place] [address]   Culture, blood (Routine X 2) w Reflex to ID Panel     Status: None (Preliminary result)   Collection Time: 10/18/20  2:14 PM   Specimen: BLOOD RIGHT HAND  Result Value Ref Range Status   Specimen Description   Final    BLOOD RIGHT HAND Performed at [facility] [address] [place], [place] [address]    Special Requests   Final    BOTTLES DRAWN AEROBIC ONLY Blood Culture adequate volume Performed at [facility] [address] [place], [place] [address]    Culture   Final     NO GROWTH 3 DAYS Performed at [facility], [place] [address] [place], [place] [address]    Report Status PENDING  Incomplete  Culture, blood (Routine X 2) w Reflex to ID Panel     Status: None (Preliminary result)   Collection Time: 10/18/20  2:14 PM   Specimen: BLOOD LEFT HAND  Result Value Ref Range Status   Specimen Description   Final    BLOOD LEFT HAND Performed at [facility] [address], [place], [place] [address]    Special Requests   Final    BOTTLES DRAWN AEROBIC ONLY Blood Culture adequate volume Performed at [facility] [address], [place], [place] [address]    Culture   Final    NO GROWTH 3 DAYS Performed at [facility], [place] [address], [place], [place] [address]    Report Status PENDING  Incomplete      Radiology Studies: No results found.    Scheduled Meds: . celecoxib  200 mg Oral q morning  . feeding supplement  1 Container Oral TID BM  . fludrocortisone  0.1 mg Oral Daily  . fluticasone furoate-vilanterol  1 puff Inhalation Q1500  . heparin  5,000 Units Subcutaneous Q8H  . mouth rinse  15 mL Mouth Rinse BID  . rosuvastatin  20 mg Oral q morning  . sertraline  50 mg Oral Daily  . sodium chloride flush  3 mL  Intravenous Q12H  . sodium chloride flush  3 mL Intravenous Q12H  . topiramate  100 mg Oral QHS   Continuous Infusions: . sodium chloride    . sodium chloride Stopped (10/17/20 2313)  . ceFEPime (MAXIPIME) IV 2 g (10/22/20 0556)  . metronidazole 500 mg (10/22/20 0900)     LOS: 5 days      Time spent: 25 minutes   Dessa Phi, DO [facility] 10/22/2020, 10:43 AM   Available via Epic secure chat 7am-7pm After these hours, please refer to coverage provider listed on [email or web address]

## 2020-10-22 NOTE — Plan of Care (Signed)
  Problem: Education: Goal: Knowledge of General Education information will improve Description: Including pain rating scale, medication(s)/side effects and non-pharmacologic comfort measures Outcome: Progressing   Problem: Activity: Goal: Risk for activity intolerance will decrease Outcome: Progressing   Problem: Nutrition: Goal: Adequate nutrition will be maintained Outcome: Progressing   

## 2020-10-22 NOTE — Plan of Care (Signed)
  Problem: Education: Goal: Knowledge of General Education information will improve Description: Including pain rating scale, medication(s)/side effects and non-pharmacologic comfort measures Outcome: Progressing   Problem: Clinical Measurements: Goal: Will remain free from infection Outcome: Progressing   Problem: Nutrition: Goal: Adequate nutrition will be maintained Outcome: Progressing   Problem: Pain Managment: Goal: General experience of comfort will improve Outcome: Progressing   Problem: Safety: Goal: Ability to remain free from injury will improve Outcome: Progressing

## 2020-10-23 ENCOUNTER — Inpatient Hospital Stay (HOSPITAL_COMMUNITY): Payer: 59

## 2020-10-23 ENCOUNTER — Encounter: Payer: 59 | Admitting: Cardiology

## 2020-10-23 DIAGNOSIS — R6521 Severe sepsis with septic shock: Secondary | ICD-10-CM | POA: Diagnosis not present

## 2020-10-23 DIAGNOSIS — N179 Acute kidney failure, unspecified: Secondary | ICD-10-CM | POA: Diagnosis not present

## 2020-10-23 DIAGNOSIS — A419 Sepsis, unspecified organism: Secondary | ICD-10-CM | POA: Diagnosis not present

## 2020-10-23 LAB — CULTURE, BLOOD (ROUTINE X 2)
Culture: NO GROWTH
Culture: NO GROWTH
Special Requests: ADEQUATE
Special Requests: ADEQUATE

## 2020-10-23 LAB — CBC
HCT: 29.3 % — ABNORMAL LOW (ref 36.0–46.0)
Hemoglobin: 9.6 g/dL — ABNORMAL LOW (ref 12.0–15.0)
MCH: 30.6 pg (ref 26.0–34.0)
MCHC: 32.8 g/dL (ref 30.0–36.0)
MCV: 93.3 fL (ref 80.0–100.0)
Platelets: 426 10*3/uL — ABNORMAL HIGH (ref 150–400)
RBC: 3.14 MIL/uL — ABNORMAL LOW (ref 3.87–5.11)
RDW: 13.2 % (ref 11.5–15.5)
WBC: 13.7 10*3/uL — ABNORMAL HIGH (ref 4.0–10.5)
nRBC: 0 % (ref 0.0–0.2)

## 2020-10-23 LAB — C-REACTIVE PROTEIN: CRP: 0.5 mg/dL (ref <1.0)

## 2020-10-23 LAB — GLUCOSE, CAPILLARY: Glucose-Capillary: 77 mg/dL (ref 70–99)

## 2020-10-23 LAB — SEDIMENTATION RATE: Sed Rate: 26 mm/hr — ABNORMAL HIGH (ref 0–22)

## 2020-10-23 MED ORDER — SUMATRIPTAN SUCCINATE 50 MG PO TABS
50.0000 mg | ORAL_TABLET | ORAL | Status: DC | PRN
  Administered 2020-10-23: 50 mg via ORAL
  Filled 2020-10-23 (×2): qty 1

## 2020-10-23 MED ORDER — UBROGEPANT 100 MG PO TABS: 1.0000 | ORAL_TABLET | ORAL | Status: DC | PRN

## 2020-10-23 MED ORDER — SACUBITRIL-VALSARTAN 24-26 MG PO TABS
1.0000 | ORAL_TABLET | Freq: Two times a day (BID) | ORAL | Status: DC
  Administered 2020-10-23 – 2020-10-29 (×11): 1 via ORAL
  Filled 2020-10-23 (×13): qty 1

## 2020-10-23 MED ORDER — PROMETHAZINE HCL 25 MG/ML IJ SOLN
12.5000 mg | Freq: Four times a day (QID) | INTRAMUSCULAR | Status: DC | PRN
  Administered 2020-10-23 – 2020-10-25 (×7): 12.5 mg via INTRAVENOUS
  Filled 2020-10-23 (×8): qty 1

## 2020-10-23 MED ORDER — AMOXICILLIN-POT CLAVULANATE 875-125 MG PO TABS
1.0000 | ORAL_TABLET | Freq: Two times a day (BID) | ORAL | Status: DC
  Administered 2020-10-23 – 2020-10-24 (×4): 1 via ORAL
  Filled 2020-10-23 (×6): qty 1

## 2020-10-23 MED ORDER — CARVEDILOL 3.125 MG PO TABS
3.1250 mg | ORAL_TABLET | Freq: Two times a day (BID) | ORAL | Status: DC
  Administered 2020-10-23 – 2020-10-24 (×3): 3.125 mg via ORAL
  Filled 2020-10-23 (×5): qty 1

## 2020-10-23 NOTE — Progress Notes (Signed)
PROGRESS NOTE    [name]  YSA:[id number] DOB: 04-Nov-1955 DOA: 10/17/2020 PCP: Lillard Anes, MD     Brief Narrative:  [name] is a 65 y.o. female with medical history significant of arthritis, cardiomyopathy, chronic back pain, COPD, chronic narcotic use, chronic knee pains, chronic systolic congestive heart failure, dual ICD, hyperlipidemia, hypertension, GERD, major depressive disorder, PTSD, recurrent incisional hernia repair status post flap repair 03/15/2015 who presented with abdominal pain x3 weeks intermittent, associated with intermittent nausea but no vomiting. She was seen by PCP in [facility] 10/16/20 and return for complete abdominal CT reading "Abnormal posterior, proximal sigmoid multiple colocolonic fistula and adhesion adjacent to a loop, wall thickening, consistent with acute diverticulitis or other infectious or inflammatory colitis."  She was started on empiric IV antibiotics and admitted to the hospital.  She remained hypotensive and was transferred to ICU for pressors.  PCCM, general surgery, GI consulted.  Patient continued to have clinical improvement.  Weaned off pressors.  She was resumed on her home [place].  New events last 24 hours / Subjective: Having some eggs for breakfast this morning, still having some tenderness in her lower abdomen.  Slowly improving.  Assessment & Plan:   Principal Problem:   Sepsis ([place]) Active Problems:   COPD (chronic obstructive pulmonary disease) (HCC)   Cardiomyopathy (HCC)   Chronic back pain   GERD (gastroesophageal reflux disease)   Chronic systolic congestive heart failure, NYHA class 2 (HCC)   Dilated cardiomyopathy ([place])   ICD (implantable cardioverter-defibrillator) in place   Dyslipidemia   Essential hypertension   Hypotension   Abdominal pain   Acute diverticulitis   Colonic fistula   Septic shock secondary to acute diverticulitis as well as colocolonic fistula -Presented with sepsis along with  endorgan damage including AKI, hypotension requiring pressors -Now off pressors, appreciate PCCM.  They have signed off -GI does not feel that patient's presentation is typical of Crohn's/IBD.  Likely to be a complication from underlying progressive and severe diverticular disease.  Inflammatory markers were ordered.  Following peripherally at this time.   -General surgery did not feel patient needed acute surgical intervention currently.  Signed off. -CRP 115.26, sed rate 52 --> CRP and sed rate decreasing -Blood culture 2/22 single set showing staph epidermidis.  Likely to be a contaminant, however added vancomycin due to patient's presentation with shock.  Repeat blood cultures drawn 2/23 negative. Vanco discontinued  -Cefepime, Flagyl --> de-escalate to Augmentin today -Solucortef --> weaned to home Florinef -WBC improving  Chronic systolic CHF, cardiomyopathy with biventricular ICD in place -Previous EF 35 to 40% -Continue to monitor volume status closely -Resume Coreg, Entresto, hold Ranexa and Lasix (PRN) due to shock -No acute sign of exacerbation or volume overload currently   AKI -Baseline creatinine 0.7  -Resolved   Anxiety -Continue Valium, Zoloft, Topamax  Chronic pain -Continue oxycodone, neurontin  Hyperlipidemia -Continue Crestor  Vertigo -Trial antivert PRN     DVT prophylaxis:  Place and maintain sequential compression device Start: 10/19/20 0753 heparin injection 5,000 Units Start: 10/17/20 2200 TED hose Start: 10/17/20 1757 SCDs Start: 10/17/20 1757  Code Status: Full code Family Communication: No family at bedside Disposition Plan:  Status is: Inpatient  Remains inpatient appropriate because:IV treatments appropriate due to intensity of illness or inability to take PO   Dispo: The patient is from: Home              Anticipated d/c is to: Home  Anticipated d/c date is: 1 day              Patient currently is not medically stable to  d/c.  States that her fianc does not get off work until late Midwife.  Hopeful for discharge home with family tomorrow as long as remains clinically improved.  De-escalate antibiotics and resume her home heart failure medications today.  Continue to monitor labs.   Difficult to place patient No      Consultants:   PCCM  General surgery  GI  Procedures:   None   Antimicrobials:  Anti-infectives (From admission, onward)   Start     Dose/Rate Route Frequency Ordered Stop   10/19/20 1400  vancomycin (VANCOREADY) IVPB 750 mg/150 mL  Status:  Discontinued        750 mg 150 mL/hr over 60 Minutes Intravenous Every 24 hours 10/18/20 1951 10/19/20 1159   10/18/20 1445  vancomycin (VANCOREADY) IVPB 1000 mg/200 mL        1,000 mg 200 mL/hr over 60 Minutes Intravenous  Once 10/18/20 1348 10/18/20 1536   10/18/20 0600  ceFEPIme (MAXIPIME) 2 g in sodium chloride 0.9 % 100 mL IVPB        2 g 200 mL/hr over 30 Minutes Intravenous Every 12 hours 10/17/20 1847     10/18/20 0100  metroNIDAZOLE (FLAGYL) IVPB 500 mg        500 mg 100 mL/hr over 60 Minutes Intravenous Every 8 hours 10/17/20 1802     10/17/20 1815  ceFEPIme (MAXIPIME) 2 g in sodium chloride 0.9 % 100 mL IVPB        2 g 200 mL/hr over 30 Minutes Intravenous  Once 10/17/20 1802 10/17/20 1902   10/17/20 1545  ciprofloxacin (CIPRO) IVPB 400 mg        400 mg 200 mL/hr over 60 Minutes Intravenous  Once 10/17/20 1544 10/17/20 1713   10/17/20 1545  metroNIDAZOLE (FLAGYL) IVPB 500 mg        500 mg 100 mL/hr over 60 Minutes Intravenous  Once 10/17/20 1544 10/17/20 1720       Objective: Vitals:   10/22/20 2211 10/23/20 0500 10/23/20 0545 10/23/20 0905  BP: 110/87  121/73   Pulse: 83  66   Resp: 18     Temp: 98.4 F (36.9 C)  98.5 F (36.9 C)   TempSrc: Oral  Oral   SpO2: 98%  99% 99%  Weight:  65.5 kg    Height:        Intake/Output Summary (Last 24 hours) at 10/23/2020 1213 Last data filed at 10/23/2020 0545 Gross per 24  hour  Intake 719.93 ml  Output 1275 ml  Net -555.07 ml   Filed Weights   10/21/20 0525 10/22/20 0500 10/23/20 0500  Weight: 66.5 kg 64.6 kg 65.5 kg    Examination: General exam: Appears calm and comfortable  Respiratory system: Clear to auscultation. Respiratory effort normal. Cardiovascular system: S1 & S2 heard, RRR. No pedal edema. Gastrointestinal system: Abdomen is nondistended, soft and mild tenderness palpation lower abdomen Central nervous system: Alert and oriented. Non focal exam. Speech clear  Extremities: Symmetric in appearance bilaterally  Skin: No rashes, lesions or ulcers on exposed skin  Psychiatry: Judgement and insight appear stable. Mood & affect appropriate.    Data Reviewed: I have personally reviewed following labs and imaging studies  CBC: Recent Labs  Lab 10/17/20 1322 10/18/20 0252 10/19/20 0239 10/20/20 0446 10/21/20 0415 10/22/20 0523 10/23/20 0446  WBC 15.3*   < >  11.5* 15.4* 14.4* 15.0* 13.7*  NEUTROABS 12.1*  --   --   --   --   --   --   HGB 10.4*   < > 7.8* 9.2* 8.8* 9.7* 9.6*  HCT 31.7*   < > 24.8* 28.2* 27.5* 30.0* 29.3*  MCV 92.4   < > 96.1 92.5 94.8 92.0 93.3  PLT 402*   < > 325 413* 397 452* 426*   < > = values in this interval not displayed.   Basic Metabolic Panel: Recent Labs  Lab 10/18/20 0252 10/19/20 0239 10/20/20 0446 10/21/20 0415 10/22/20 0523  NA 142 141 140 141 141  K 3.1* 3.9 3.4* 3.9 4.4  CL 114* 114* 114* 114* 114*  CO2 18* 17* 20* 21* 22  GLUCOSE 148* 111* 121* 89 89  BUN 23 14 14 14 14   CREATININE 1.09* 0.75 0.78 0.69 0.69  CALCIUM 7.4* 7.3* 7.4* 7.5* 8.1*  MG  --  1.6* 3.0*  --   --    GFR: Estimated Creatinine Clearance: 58.4 mL/min (by C-G formula based on SCr of 0.69 mg/dL). Liver Function Tests: Recent Labs  Lab 10/17/20 1322  AST 14*  ALT 11  ALKPHOS 74  BILITOT 0.7  PROT 6.6  ALBUMIN 3.1*   No results for input(s): LIPASE, AMYLASE in the last 168 hours. No results for input(s):  AMMONIA in the last 168 hours. Coagulation Profile: Recent Labs  Lab 10/18/20 0252  INR 1.0   Cardiac Enzymes: No results for input(s): CKTOTAL, CKMB, CKMBINDEX, TROPONINI in the last 168 hours. BNP (last 3 results) Recent Labs    12/02/19 1055 09/05/20 1138  PROBNP 1,044* 546*   HbA1C: No results for input(s): HGBA1C in the last 72 hours. CBG: Recent Labs  Lab 10/19/20 0745 10/20/20 0739 10/21/20 0744 10/22/20 0731 10/23/20 0739  GLUCAP 102* 122* 88 91 77   Lipid Profile: No results for input(s): CHOL, HDL, LDLCALC, TRIG, CHOLHDL, LDLDIRECT in the last 72 hours. Thyroid Function Tests: No results for input(s): TSH, T4TOTAL, FREET4, T3FREE, THYROIDAB in the last 72 hours. Anemia Panel: No results for input(s): VITAMINB12, FOLATE, FERRITIN, TIBC, IRON, RETICCTPCT in the last 72 hours. Sepsis Labs: Recent Labs  Lab 10/17/20 1322 10/17/20 2030  LATICACIDVEN 1.1 1.3    Recent Results (from the past 240 hour(s))  Culture, blood (routine x 2)     Status: Abnormal   Collection Time: 10/17/20  1:15 PM   Specimen: BLOOD RIGHT FOREARM  Result Value Ref Range Status   Specimen Description   Final    BLOOD RIGHT FOREARM Performed at [facility] [address] [place], [place] [address]    Special Requests   Final    BOTTLES DRAWN AEROBIC AND ANAEROBIC Blood Culture adequate volume Performed at [facility] [address] [place], [place] [address]    Culture  Setup Time   Final    GRAM POSITIVE COCCI IN CLUSTERS ANAEROBIC BOTTLE ONLY Organism ID to follow CRITICAL RESULT CALLED TO, READ BACK BY AND VERIFIED WITH: Sheffield Slider PHARMD [id number] 10/18/20 A BROWNING    Culture (A)  Final    STAPHYLOCOCCUS EPIDERMIDIS THE SIGNIFICANCE OF ISOLATING THIS ORGANISM FROM A SINGLE SET OF BLOOD CULTURES WHEN MULTIPLE SETS ARE DRAWN IS UNCERTAIN. PLEASE NOTIFY THE MICROBIOLOGY DEPARTMENT WITHIN ONE WEEK IF SPECIATION AND SENSITIVITIES ARE  REQUIRED. Performed at [facility], [place] [address], [place], [place] [address]    Report Status 10/20/2020 FINAL  Final  Blood Culture ID Panel (Reflexed)     Status: Abnormal   Collection Time: 10/17/20  1:15 PM  Result Value Ref Range Status   Enterococcus faecalis NOT DETECTED NOT DETECTED Final   Enterococcus Faecium NOT DETECTED NOT DETECTED Final   Listeria monocytogenes NOT DETECTED NOT DETECTED Final   Staphylococcus species DETECTED (A) NOT DETECTED Final    Comment: CRITICAL RESULT CALLED TO, READ BACK BY AND VERIFIED WITH: Sheffield Slider PHARMD [id number] 10/18/20 A BROWNING    Staphylococcus aureus (BCID) NOT DETECTED NOT DETECTED Final   Staphylococcus epidermidis DETECTED (A) NOT DETECTED Final    Comment: Methicillin (oxacillin) resistant coagulase negative staphylococcus. Possible blood culture contaminant (unless isolated from more than one blood culture draw or clinical case suggests pathogenicity). No antibiotic treatment is indicated for blood  culture contaminants. CRITICAL RESULT CALLED TO, READ BACK BY AND VERIFIED WITH: Sheffield Slider PHARMD [id number] 10/18/20 A BROWNING    Staphylococcus lugdunensis NOT DETECTED NOT DETECTED Final   Streptococcus species NOT DETECTED NOT DETECTED Final   Streptococcus agalactiae NOT DETECTED NOT DETECTED Final   Streptococcus pneumoniae NOT DETECTED NOT DETECTED Final   Streptococcus pyogenes NOT DETECTED NOT DETECTED Final   A.calcoaceticus-baumannii NOT DETECTED NOT DETECTED Final   Bacteroides fragilis NOT DETECTED NOT DETECTED Final   Enterobacterales NOT DETECTED NOT DETECTED Final   Enterobacter cloacae complex NOT DETECTED NOT DETECTED Final   Escherichia coli NOT DETECTED NOT DETECTED Final   Klebsiella aerogenes NOT DETECTED NOT DETECTED Final   Klebsiella oxytoca NOT DETECTED NOT DETECTED Final   Klebsiella pneumoniae NOT DETECTED NOT DETECTED Final   Proteus species NOT DETECTED NOT DETECTED Final   Salmonella species NOT  DETECTED NOT DETECTED Final   Serratia marcescens NOT DETECTED NOT DETECTED Final   Haemophilus influenzae NOT DETECTED NOT DETECTED Final   Neisseria meningitidis NOT DETECTED NOT DETECTED Final   Pseudomonas aeruginosa NOT DETECTED NOT DETECTED Final   Stenotrophomonas maltophilia NOT DETECTED NOT DETECTED Final   Candida albicans NOT DETECTED NOT DETECTED Final   Candida auris NOT DETECTED NOT DETECTED Final   Candida glabrata NOT DETECTED NOT DETECTED Final   Candida krusei NOT DETECTED NOT DETECTED Final   Candida parapsilosis NOT DETECTED NOT DETECTED Final   Candida tropicalis NOT DETECTED NOT DETECTED Final   Cryptococcus neoformans/gattii NOT DETECTED NOT DETECTED Final   Methicillin resistance mecA/C DETECTED (A) NOT DETECTED Final    Comment: CRITICAL RESULT CALLED TO, READ BACK BY AND VERIFIED WITH: Sheffield Slider [facility] [id number] 10/18/20 A BROWNING Performed at [facility], [address], [place], [place] [address]   Culture, blood (routine x 2)     Status: None   Collection Time: 10/17/20  1:22 PM   Specimen: BLOOD  Result Value Ref Range Status   Specimen Description   Final    BLOOD SITE NOT SPECIFIED Performed at [facility], [address], [place], [place] [address]    Special Requests   Final    BOTTLES DRAWN AEROBIC AND ANAEROBIC Blood Culture results may not be optimal due to an inadequate volume of blood received in culture bottles Performed at [facility] [address] [place], [place] [address]    Culture   Final    NO GROWTH 5 DAYS Performed at [facility], [place] [address], [place], [place] [address]    Report Status 10/22/2020 FINAL  Final  SARS CORONAVIRUS 2 (TAT 6-24 HRS) Nasopharyngeal Nasopharyngeal Swab  Status: None   Collection Time: 10/17/20  5:24 PM   Specimen: Nasopharyngeal Swab  Result Value Ref Range Status   SARS Coronavirus 2 NEGATIVE NEGATIVE Final    Comment: (NOTE) SARS-CoV-2 target nucleic  acids are NOT DETECTED.  The SARS-CoV-2 RNA is generally detectable in upper and lower respiratory specimens during the acute phase of infection. Negative results do not preclude SARS-CoV-2 infection, do not rule out co-infections with other pathogens, and should not be used as the sole basis for treatment or other patient management decisions. Negative results must be combined with clinical observations, patient history, and epidemiological information. The expected result is Negative.  Fact Sheet for Patients: SugarRoll.be  Fact Sheet for Healthcare Providers: https://www.woods-mathews.com/  This test is not yet approved or cleared by the [place] FDA and  has been authorized for detection and/or diagnosis of SARS-CoV-2 by FDA under an Emergency Use Authorization (EUA). This EUA will remain  in effect (meaning this test can be used) for the duration of the COVID-19 declaration under Se ction 564(b)(1) of the Act, 21 U.S.C. section 360bbb-3(b)(1), unless the authorization is terminated or revoked sooner.  Performed at [facility], [place] [address] [place], [place] [address]   MRSA PCR Screening     Status: None   Collection Time: 10/17/20 10:46 PM   Specimen: Nasopharyngeal  Result Value Ref Range Status   MRSA by PCR NEGATIVE NEGATIVE Final    Comment:        The GeneXpert MRSA Assay (FDA approved for NASAL specimens only), is one component of a comprehensive MRSA colonization surveillance program. It is not intended to diagnose MRSA infection nor to guide or monitor treatment for MRSA infections. Performed at [facility], [place] [address], [place], [place] [address]   Culture, blood (Routine X 2) w Reflex to ID Panel     Status: None (Preliminary result)   Collection Time: 10/18/20  2:14 PM   Specimen: BLOOD RIGHT HAND  Result Value Ref Range Status   Specimen Description   Final    BLOOD RIGHT  HAND Performed at [facility] [address] [place], [place] [address]    Special Requests   Final    BOTTLES DRAWN AEROBIC ONLY Blood Culture adequate volume Performed at [facility] [address] [place], [place] [address]    Culture   Final    NO GROWTH 4 DAYS Performed at [facility], [place] [address], [place], [place] [address]    Report Status PENDING  Incomplete  Culture, blood (Routine X 2) w Reflex to ID Panel     Status: None (Preliminary result)   Collection Time: 10/18/20  2:14 PM   Specimen: BLOOD LEFT HAND  Result Value Ref Range Status   Specimen Description   Final    BLOOD LEFT HAND Performed at [facility] [address], [place], [place] [address]    Special Requests   Final    BOTTLES DRAWN AEROBIC ONLY Blood Culture adequate volume Performed at [facility] [address] [place], [place] [address]    Culture   Final    NO GROWTH 4 DAYS Performed at [facility], [place] [address], [place],  [address]    Report Status PENDING  Incomplete      Radiology Studies: No results found.    Scheduled Meds: . celecoxib  200 mg Oral q morning  . feeding supplement  1 Container Oral TID BM  . fludrocortisone  0.1 mg Oral Daily  . fluticasone furoate-vilanterol  1 puff Inhalation Q1500  . heparin  5,000 Units Subcutaneous Q8H  . mouth rinse  15 mL Mouth Rinse BID  . rosuvastatin  20 mg Oral q morning  . sertraline  50 mg Oral Daily  . sodium chloride flush  3 mL Intravenous Q12H  . sodium chloride flush  3 mL Intravenous Q12H  . topiramate  100 mg Oral QHS   Continuous Infusions: . sodium chloride    . sodium chloride Stopped (10/17/20 2313)  . ceFEPime (MAXIPIME) IV 2 g (10/23/20 0548)  . metronidazole 500 mg (10/23/20 0835)     LOS: 6 days      Time spent: 25 minutes   Dessa Phi, DO [facility] 10/23/2020, 12:13 PM   Available  via Epic secure chat 7am-7pm After these hours, please refer to coverage provider listed on [email or web address]

## 2020-10-23 NOTE — Care Management Important Message (Signed)
Medicare IM printed for Social work team at [facility] to give to the patient. °

## 2020-10-23 NOTE — Progress Notes (Signed)
Physical Therapy Treatment Patient Details Name: [name] MRN: [id number] DOB: August 12, 1956 Today's Date: 10/23/2020    History of Present Illness 65 y.o. female with PMH of arthritis, cardiomyopathy, chronic back pain, COPD, chronic narcotic use, chronic knee pains, chronic systolic congestive heart failure, dual ICD, hyperlipidemia, hypertension, GERD, major depressive disorder, PTSD, recurrent incisional hernia repair status post flap repair 03/15/2015 . Presenting with abdominal pain x3 weeks. 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    PT Comments    Pt able to ambulate in hallway however continues to report dizziness.  Pt had meclizine prior to visit.  Pt with dry heaving upon return to recliner and RN notified.   Follow Up Recommendations  Home health PT;Supervision for mobility/OOB     Equipment Recommendations  None recommended by PT    Recommendations for Other Services       Precautions / Restrictions Precautions Precautions: Fall Precaution Comments: dizzy with mobility, now taking meclizine    Mobility  Bed Mobility               General bed mobility comments: pt standing on arrival (just used [facility]) with nurse tech    Transfers Overall transfer level: Needs assistance Equipment used: Rolling walker (2 wheeled) Transfers: Sit to/from Stand Sit to Stand: Min guard         General transfer comment: cues for hand placement  Ambulation/Gait Ambulation/Gait assistance: Min guard Gait Distance (Feet): 80 Feet Assistive device: Rolling walker (2 wheeled) Gait Pattern/deviations: Step-through pattern;Decreased stride length     General Gait Details: slow cautious gait, pt reports dizziness and had nausea and dry heaving upon return to recliner; recliner followed for safety in hallway however pt did not require (able to ambulate back to room)   Stairs             Wheelchair Mobility    Modified  Rankin (Stroke Patients Only)       Balance Overall balance assessment: Needs assistance         Standing balance support: Bilateral upper extremity supported;During functional activity Standing balance-Leahy Scale: Poor                              Cognition Arousal/Alertness: Awake/alert Behavior During Therapy: WFL for tasks assessed/performed Overall Cognitive Status: Within Functional Limits for tasks assessed                                        Exercises      General Comments        Pertinent Vitals/Pain Pain Assessment: Faces Faces Pain Scale: Hurts little more Pain Location: abdomen Pain Descriptors / Indicators: Grimacing;Guarding Pain Intervention(s): Repositioned;Monitored during session    Home Living                      Prior Function            PT Goals (current goals can now be found in the care plan section) Progress towards PT goals: Progressing toward goals    Frequency    Min 3X/week      PT Plan Current plan remains appropriate    Co-evaluation              AM-PAC PT "6 Clicks" Mobility   Outcome Measure  Help needed turning from  your back to your side while in a flat bed without using bedrails?: A Little Help needed moving from lying on your back to sitting on the side of a flat bed without using bedrails?: A Little Help needed moving to and from a bed to a chair (including a wheelchair)?: A Little Help needed standing up from a chair using your arms (e.g., wheelchair or bedside chair)?: A Little Help needed to walk in hospital room?: A Little Help needed climbing 3-5 steps with a railing? : A Lot 6 Click Score: 17    End of Session Equipment Utilized During Treatment: Gait belt Activity Tolerance: Patient tolerated treatment well Patient left: in chair;with call bell/phone within reach;with chair alarm set Nurse Communication: Mobility status PT Visit Diagnosis: Unsteadiness on  feet (R26.81);Difficulty in walking, not elsewhere classified (R26.2)     Time: 5329-9242 PT Time Calculation (min) (ACUTE ONLY): 20 min  Charges:  $Gait Training: 8-22 mins                    Arlyce Dice, DPT Acute Rehabilitation Services Pager: [phone] Office: [phone]  Trena Platt 10/23/2020, 12:36 PM

## 2020-10-23 NOTE — Plan of Care (Signed)

## 2020-10-23 NOTE — Progress Notes (Signed)
OT Cancellation Note  Patient Details Name: [name] MRN: [id number] DOB: [birth year]   Cancelled Treatment:    Reason Eval/Treat Not Completed: Medical issues which prohibited therapy: Pt reports severe nausea after ambulating in hallway ~1 hour before visit, and politely declined OT. RN notified of pt's continued nausea.   Julien Girt 10/23/2020, 1:42 PM

## 2020-10-24 DIAGNOSIS — N179 Acute kidney failure, unspecified: Secondary | ICD-10-CM | POA: Diagnosis not present

## 2020-10-24 DIAGNOSIS — A419 Sepsis, unspecified organism: Secondary | ICD-10-CM | POA: Diagnosis not present

## 2020-10-24 DIAGNOSIS — R6521 Severe sepsis with septic shock: Secondary | ICD-10-CM | POA: Diagnosis not present

## 2020-10-24 LAB — CBC
HCT: 30.6 % — ABNORMAL LOW (ref 36.0–46.0)
Hemoglobin: 9.9 g/dL — ABNORMAL LOW (ref 12.0–15.0)
MCH: 30.2 pg (ref 26.0–34.0)
MCHC: 32.4 g/dL (ref 30.0–36.0)
MCV: 93.3 fL (ref 80.0–100.0)
Platelets: 474 10*3/uL — ABNORMAL HIGH (ref 150–400)
RBC: 3.28 MIL/uL — ABNORMAL LOW (ref 3.87–5.11)
RDW: 13.2 % (ref 11.5–15.5)
WBC: 13.3 10*3/uL — ABNORMAL HIGH (ref 4.0–10.5)
nRBC: 0 % (ref 0.0–0.2)

## 2020-10-24 LAB — BASIC METABOLIC PANEL
Anion gap: 6 (ref 5–15)
BUN: 9 mg/dL (ref 8–23)
CO2: 20 mmol/L — ABNORMAL LOW (ref 22–32)
Calcium: 8 mg/dL — ABNORMAL LOW (ref 8.9–10.3)
Chloride: 116 mmol/L — ABNORMAL HIGH (ref 98–111)
Creatinine, Ser: 0.65 mg/dL (ref 0.44–1.00)
GFR, Estimated: >60 mL/min (ref >60)
Glucose, Bld: 86 mg/dL (ref 70–99)
Potassium: 3.6 mmol/L (ref 3.5–5.1)
Sodium: 142 mmol/L (ref 135–145)

## 2020-10-24 LAB — GLUCOSE, CAPILLARY: Glucose-Capillary: 90 mg/dL (ref 70–99)

## 2020-10-24 NOTE — Progress Notes (Signed)
PROGRESS NOTE    [name]  HCW:[id number] DOB: 01-04-1956 DOA: 10/17/2020 PCP: Lillard Anes, MD     Brief Narrative:  [name] is a 64 y.o. female with medical history significant of arthritis, cardiomyopathy, chronic back pain, COPD, chronic narcotic use, chronic knee pains, chronic systolic congestive heart failure, dual ICD, hyperlipidemia, hypertension, GERD, major depressive disorder, PTSD, recurrent incisional hernia repair status post flap repair 03/15/2015 who presented with abdominal pain x3 weeks intermittent, associated with intermittent nausea but no vomiting. She was seen by PCP in [place] 10/16/20 and return for complete abdominal CT reading "Abnormal posterior, proximal sigmoid multiple colocolonic fistula and adhesion adjacent to a loop, wall thickening, consistent with acute diverticulitis or other infectious or inflammatory colitis."  She was started on empiric IV antibiotics and admitted to the hospital.  She remained hypotensive and was transferred to ICU for pressors.  PCCM, general surgery, GI consulted.  Patient continued to have clinical improvement.  Weaned off pressors.  She was resumed on her home [place].  New events last 24 hours / Subjective: Has had significant nausea, vomiting last 24 hours.  She states that after she worked with physical therapy yesterday, she started to have dizziness and has had nausea and vomiting ever since.  States that her abdominal pain is worse but thinks it is because she has been vomiting so much.  Assessment & Plan:   Principal Problem:   Sepsis ([place]) Active Problems:   COPD (chronic obstructive pulmonary disease) (HCC)   Cardiomyopathy (HCC)   Chronic back pain   GERD (gastroesophageal reflux disease)   Chronic systolic congestive heart failure, NYHA class 2 (HCC)   Dilated cardiomyopathy ([place])   ICD (implantable cardioverter-defibrillator) in place   Dyslipidemia   Essential hypertension   Hypotension    Abdominal pain   Acute diverticulitis   Colonic fistula   Septic shock secondary to acute diverticulitis as well as colocolonic fistula -Presented with sepsis along with endorgan damage including AKI, hypotension requiring pressors -Now off pressors, appreciate PCCM.  They have signed off -GI does not feel that patient's presentation is typical of Crohn's/IBD.  Likely to be a complication from underlying progressive and severe diverticular disease.  Inflammatory markers were ordered.  Following peripherally at this time.   -General surgery did not feel patient needed acute surgical intervention currently.  Signed off. -CRP 115.26, sed rate 52 --> CRP and sed rate trended down -Blood culture 2/22 single set showing staph epidermidis.  Likely to be a contaminant, however added vancomycin due to patient's presentation with shock.  Repeat blood cultures drawn 2/23 negative. Vanco discontinued  -Cefepime, Flagyl --> de-escalated to Augmentin  -Solucortef --> weaned to home [place] -WBC improving -Check orthostatic VS today   Chronic systolic CHF, cardiomyopathy with biventricular ICD in place -Previous EF 35 to 40% -Continue to monitor volume status closely -Resume Coreg, Entresto, hold Ranexa and Lasix (PRN) due to shock -No acute sign of exacerbation or volume overload currently   AKI -Baseline creatinine 0.7  -Resolved   Anxiety -Continue Valium, Zoloft, Topamax  Chronic pain -Continue oxycodone, neurontin  Hyperlipidemia -Continue Crestor  Vertigo -Trial antivert PRN     DVT prophylaxis:  Place and maintain sequential compression device Start: 10/19/20 0753 heparin injection 5,000 Units Start: 10/17/20 2200 TED hose Start: 10/17/20 1757 SCDs Start: 10/17/20 1757  Code Status: Full code Family Communication: No family at bedside Disposition Plan:  Status is: Inpatient  Remains inpatient appropriate because:IV treatments appropriate due to  intensity of illness or  inability to take PO   Dispo: The patient is from: Home              Anticipated d/c is to: Home              Anticipated d/c date is: 1 day              Patient currently is not medically stable to d/c.  Having nausea, vomiting last 24 hours. ?Side effect of augmentin vs vertigo    Difficult to place patient No      Consultants:   PCCM  General surgery  GI  Procedures:   None   Antimicrobials:  Anti-infectives (From admission, onward)   Start     Dose/Rate Route Frequency Ordered Stop   10/23/20 1300  amoxicillin-clavulanate (AUGMENTIN) 875-125 MG per tablet 1 tablet        1 tablet Oral Every 12 hours 10/23/20 1214     10/19/20 1400  vancomycin (VANCOREADY) IVPB 750 mg/150 mL  Status:  Discontinued        750 mg 150 mL/hr over 60 Minutes Intravenous Every 24 hours 10/18/20 1951 10/19/20 1159   10/18/20 1445  vancomycin (VANCOREADY) IVPB 1000 mg/200 mL        1,000 mg 200 mL/hr over 60 Minutes Intravenous  Once 10/18/20 1348 10/18/20 1536   10/18/20 0600  ceFEPIme (MAXIPIME) 2 g in sodium chloride 0.9 % 100 mL IVPB  Status:  Discontinued        2 g 200 mL/hr over 30 Minutes Intravenous Every 12 hours 10/17/20 1847 10/23/20 1214   10/18/20 0100  metroNIDAZOLE (FLAGYL) IVPB 500 mg  Status:  Discontinued        500 mg 100 mL/hr over 60 Minutes Intravenous Every 8 hours 10/17/20 1802 10/23/20 1214   10/17/20 1815  ceFEPIme (MAXIPIME) 2 g in sodium chloride 0.9 % 100 mL IVPB        2 g 200 mL/hr over 30 Minutes Intravenous  Once 10/17/20 1802 10/17/20 1902   10/17/20 1545  ciprofloxacin (CIPRO) IVPB 400 mg        400 mg 200 mL/hr over 60 Minutes Intravenous  Once 10/17/20 1544 10/17/20 1713   10/17/20 1545  metroNIDAZOLE (FLAGYL) IVPB 500 mg        500 mg 100 mL/hr over 60 Minutes Intravenous  Once 10/17/20 1544 10/17/20 1720       Objective: Vitals:   10/23/20 1229 10/23/20 2223 10/24/20 0545 10/24/20 0858  BP: 120/67 113/69 96/63   Pulse: 69 (!) 59 70   Resp:  20 14 14    Temp: 97.6 F (36.4 C) 98.2 F (36.8 C) 98.2 F (36.8 C)   TempSrc: Oral Oral Oral   SpO2: 100% 98% 92% 99%  Weight:      Height:        Intake/Output Summary (Last 24 hours) at 10/24/2020 1051 Last data filed at 10/23/2020 2036 Gross per 24 hour  Intake 400 ml  Output 750 ml  Net -350 ml   Filed Weights   10/21/20 0525 10/22/20 0500 10/23/20 0500  Weight: 66.5 kg 64.6 kg 65.5 kg    Examination: General exam: Appears calm and comfortable, weak Respiratory system: Clear to auscultation. Respiratory effort normal. Cardiovascular system: S1 & S2 heard, RRR. No pedal edema. Gastrointestinal system: Abdomen is nondistended, soft and nontender. Normal bowel sounds heard. Central nervous system: Alert and oriented. Non focal exam. Speech clear  Extremities: Symmetric in appearance  bilaterally  Skin: No rashes, lesions or ulcers on exposed skin  Psychiatry: Judgement and insight appear stable. Mood & affect appropriate.     Data Reviewed: I have personally reviewed following labs and imaging studies  CBC: Recent Labs  Lab 10/17/20 1322 10/18/20 0252 10/20/20 0446 10/21/20 0415 10/22/20 0523 10/23/20 0446 10/24/20 0451  WBC 15.3*   < > 15.4* 14.4* 15.0* 13.7* 13.3*  NEUTROABS 12.1*  --   --   --   --   --   --   HGB 10.4*   < > 9.2* 8.8* 9.7* 9.6* 9.9*  HCT 31.7*   < > 28.2* 27.5* 30.0* 29.3* 30.6*  MCV 92.4   < > 92.5 94.8 92.0 93.3 93.3  PLT 402*   < > 413* 397 452* 426* 474*   < > = values in this interval not displayed.   Basic Metabolic Panel: Recent Labs  Lab 10/19/20 0239 10/20/20 0446 10/21/20 0415 10/22/20 0523 10/24/20 0451  NA 141 140 141 141 142  K 3.9 3.4* 3.9 4.4 3.6  CL 114* 114* 114* 114* 116*  CO2 17* 20* 21* 22 20*  GLUCOSE 111* 121* 89 89 86  BUN 14 14 14 14 9   CREATININE 0.75 0.78 0.69 0.69 0.65  CALCIUM 7.3* 7.4* 7.5* 8.1* 8.0*  MG 1.6* 3.0*  --   --   --    GFR: Estimated Creatinine Clearance: 58.4 mL/min (by C-G formula  based on SCr of 0.65 mg/dL). Liver Function Tests: Recent Labs  Lab 10/17/20 1322  AST 14*  ALT 11  ALKPHOS 74  BILITOT 0.7  PROT 6.6  ALBUMIN 3.1*   No results for input(s): LIPASE, AMYLASE in the last 168 hours. No results for input(s): AMMONIA in the last 168 hours. Coagulation Profile: Recent Labs  Lab 10/18/20 0252  INR 1.0   Cardiac Enzymes: No results for input(s): CKTOTAL, CKMB, CKMBINDEX, TROPONINI in the last 168 hours. BNP (last 3 results) Recent Labs    12/02/19 1055 09/05/20 1138  PROBNP 1,044* 546*   HbA1C: No results for input(s): HGBA1C in the last 72 hours. CBG: Recent Labs  Lab 10/20/20 0739 10/21/20 0744 10/22/20 0731 10/23/20 0739 10/24/20 0748  GLUCAP 122* 88 91 77 90   Lipid Profile: No results for input(s): CHOL, HDL, LDLCALC, TRIG, CHOLHDL, LDLDIRECT in the last 72 hours. Thyroid Function Tests: No results for input(s): TSH, T4TOTAL, FREET4, T3FREE, THYROIDAB in the last 72 hours. Anemia Panel: No results for input(s): VITAMINB12, FOLATE, FERRITIN, TIBC, IRON, RETICCTPCT in the last 72 hours. Sepsis Labs: Recent Labs  Lab 10/17/20 1322 10/17/20 2030  LATICACIDVEN 1.1 1.3    Recent Results (from the past 240 hour(s))  Culture, blood (routine x 2)     Status: Abnormal   Collection Time: 10/17/20  1:15 PM   Specimen: BLOOD RIGHT FOREARM  Result Value Ref Range Status   Specimen Description   Final    BLOOD RIGHT FOREARM Performed at [facility] [address], [place], [place] [address]    Special Requests   Final    BOTTLES DRAWN AEROBIC AND ANAEROBIC Blood Culture adequate volume Performed at [facility] [address], [place],  [address]    Culture  Setup Time   Final    GRAM POSITIVE COCCI IN CLUSTERS ANAEROBIC BOTTLE ONLY Organism ID to follow CRITICAL RESULT CALLED TO, READ BACK BY AND VERIFIED WITH: Sheffield Slider PHARMD [id number] 10/18/20 A BROWNING    Culture (A)  Final     STAPHYLOCOCCUS EPIDERMIDIS THE SIGNIFICANCE OF ISOLATING THIS ORGANISM FROM A SINGLE SET OF BLOOD CULTURES WHEN MULTIPLE SETS ARE DRAWN IS UNCERTAIN. PLEASE NOTIFY THE MICROBIOLOGY DEPARTMENT WITHIN ONE WEEK IF SPECIATION AND SENSITIVITIES ARE REQUIRED. Performed at [facility], [place] [address] [place], [place] [address]    Report Status 10/20/2020 FINAL  Final  Blood Culture ID Panel (Reflexed)     Status: Abnormal   Collection Time: 10/17/20  1:15 PM  Result Value Ref Range Status   Enterococcus faecalis NOT DETECTED NOT DETECTED Final   Enterococcus Faecium NOT DETECTED NOT DETECTED Final   Listeria monocytogenes NOT DETECTED NOT DETECTED Final   Staphylococcus species DETECTED (A) NOT DETECTED Final    Comment: CRITICAL RESULT CALLED TO, READ BACK BY AND VERIFIED WITH: Sheffield Slider PHARMD [id number] 10/18/20 A BROWNING    Staphylococcus aureus (BCID) NOT DETECTED NOT DETECTED Final   Staphylococcus epidermidis DETECTED (A) NOT DETECTED Final    Comment: Methicillin (oxacillin) resistant coagulase negative staphylococcus. Possible blood culture contaminant (unless isolated from more than one blood culture draw or clinical case suggests pathogenicity). No antibiotic treatment is indicated for blood  culture contaminants. CRITICAL RESULT CALLED TO, READ BACK BY AND VERIFIED WITH: Sheffield Slider PHARMD [id number] 10/18/20 A BROWNING    Staphylococcus lugdunensis NOT DETECTED NOT DETECTED Final   Streptococcus species NOT DETECTED NOT DETECTED Final   Streptococcus agalactiae NOT DETECTED NOT DETECTED Final   Streptococcus pneumoniae NOT DETECTED NOT DETECTED Final   Streptococcus pyogenes NOT DETECTED NOT DETECTED Final   A.calcoaceticus-baumannii NOT DETECTED NOT DETECTED Final   Bacteroides fragilis NOT DETECTED NOT DETECTED Final   Enterobacterales NOT DETECTED NOT DETECTED Final   Enterobacter cloacae complex NOT DETECTED NOT DETECTED Final   Escherichia coli NOT DETECTED NOT DETECTED Final    Klebsiella aerogenes NOT DETECTED NOT DETECTED Final   Klebsiella oxytoca NOT DETECTED NOT DETECTED Final   Klebsiella pneumoniae NOT DETECTED NOT DETECTED Final   Proteus species NOT DETECTED NOT DETECTED Final   Salmonella species NOT DETECTED NOT DETECTED Final   Serratia marcescens NOT DETECTED NOT DETECTED Final   Haemophilus influenzae NOT DETECTED NOT DETECTED Final   Neisseria meningitidis NOT DETECTED NOT DETECTED Final   Pseudomonas aeruginosa NOT DETECTED NOT DETECTED Final   Stenotrophomonas maltophilia NOT DETECTED NOT DETECTED Final   Candida albicans NOT DETECTED NOT DETECTED Final   Candida auris NOT DETECTED NOT DETECTED Final   Candida glabrata NOT DETECTED NOT DETECTED Final   Candida krusei NOT DETECTED NOT DETECTED Final   Candida parapsilosis NOT DETECTED NOT DETECTED Final   Candida tropicalis NOT DETECTED NOT DETECTED Final   Cryptococcus neoformans/gattii NOT DETECTED NOT DETECTED Final   Methicillin resistance mecA/C DETECTED (A) NOT DETECTED Final    Comment: CRITICAL RESULT CALLED TO, READ BACK BY AND VERIFIED WITH: Sheffield Slider [facility] [id number] 10/18/20 A BROWNING Performed at [facility], [address], [place], [place] [address]   Culture, blood (routine x 2)     Status: None   Collection Time: 10/17/20  1:22 PM   Specimen: BLOOD  Result Value Ref Range Status   Specimen Description   Final    BLOOD SITE NOT SPECIFIED Performed at [facility], [address], [place], [place] [address]    Special Requests   Final    BOTTLES DRAWN AEROBIC AND ANAEROBIC Blood Culture results may not be optimal due to an inadequate volume of blood received in culture  bottles Performed at [facility] [address], [place], [place] [address]    Culture   Final    NO GROWTH 5 DAYS Performed at [facility], [place] [address] [place], [place] [address]    Report Status 10/22/2020 FINAL  Final  SARS CORONAVIRUS 2 (TAT 6-24 HRS)  Nasopharyngeal Nasopharyngeal Swab     Status: None   Collection Time: 10/17/20  5:24 PM   Specimen: Nasopharyngeal Swab  Result Value Ref Range Status   SARS Coronavirus 2 NEGATIVE NEGATIVE Final    Comment: (NOTE) SARS-CoV-2 target nucleic acids are NOT DETECTED.  The SARS-CoV-2 RNA is generally detectable in upper and lower respiratory specimens during the acute phase of infection. Negative results do not preclude SARS-CoV-2 infection, do not rule out co-infections with other pathogens, and should not be used as the sole basis for treatment or other patient management decisions. Negative results must be combined with clinical observations, patient history, and epidemiological information. The expected result is Negative.  Fact Sheet for Patients: SugarRoll.be  Fact Sheet for Healthcare Providers: https://www.woods-mathews.com/  This test is not yet approved or cleared by the [place] FDA and  has been authorized for detection and/or diagnosis of SARS-CoV-2 by FDA under an Emergency Use Authorization (EUA). This EUA will remain  in effect (meaning this test can be used) for the duration of the COVID-19 declaration under Se ction 564(b)(1) of the Act, 21 U.S.C. section 360bbb-3(b)(1), unless the authorization is terminated or revoked sooner.  Performed at [facility], [place] [address], [place], [place] [address]   MRSA PCR Screening     Status: None   Collection Time: 10/17/20 10:46 PM   Specimen: Nasopharyngeal  Result Value Ref Range Status   MRSA by PCR NEGATIVE NEGATIVE Final    Comment:        The GeneXpert MRSA Assay (FDA approved for NASAL specimens only), is one component of a comprehensive MRSA colonization surveillance program. It is not intended to diagnose MRSA infection nor to guide or monitor treatment for MRSA infections. Performed at [facility], [place] [address], [place], [place]  [address]   Culture, blood (Routine X 2) w Reflex to ID Panel     Status: None   Collection Time: 10/18/20  2:14 PM   Specimen: BLOOD RIGHT HAND  Result Value Ref Range Status   Specimen Description   Final    BLOOD RIGHT HAND Performed at [facility] [address], [place], [place] [address]    Special Requests   Final    BOTTLES DRAWN AEROBIC ONLY Blood Culture adequate volume Performed at [facility] [address], [place], [place] [address]    Culture   Final    NO GROWTH 5 DAYS Performed at [facility], [place] [address] [place], [place] [address]    Report Status 10/23/2020 FINAL  Final  Culture, blood (Routine X 2) w Reflex to ID Panel     Status: None   Collection Time: 10/18/20  2:14 PM   Specimen: BLOOD LEFT HAND  Result Value Ref Range Status   Specimen Description   Final    BLOOD LEFT HAND Performed at [facility] [address], [place], [place] [address]    Special Requests   Final    BOTTLES DRAWN AEROBIC ONLY Blood Culture adequate volume Performed at [facility] [address], [place],  [address]  Culture   Final    NO GROWTH 5 DAYS Performed at [facility], [place] [address], [place], [place] [address]    Report Status 10/23/2020 FINAL  Final      Radiology Studies: DG Abd 1 View  Result Date: 10/23/2020 CLINICAL DATA:  Nausea and vomiting EXAM: ABDOMEN - 1 VIEW COMPARISON:  03/27/2015 FINDINGS: Scattered large and small bowel gas is noted. No obstructive changes are seen. No free air is noted. Postsurgical changes are seen in the right hip. Defibrillator is noted. IMPRESSION: No obstructive changes seen. Electronically Signed   By: Inez Catalina M.D.   On: 10/23/2020 17:44      Scheduled Meds: . amoxicillin-clavulanate  1 tablet Oral Q12H  . carvedilol  3.125 mg Oral BID  . celecoxib  200 mg Oral q morning  . feeding supplement  1 Container Oral  TID BM  . fludrocortisone  0.1 mg Oral Daily  . fluticasone furoate-vilanterol  1 puff Inhalation Q1500  . heparin  5,000 Units Subcutaneous Q8H  . mouth rinse  15 mL Mouth Rinse BID  . rosuvastatin  20 mg Oral q morning  . sacubitril-valsartan  1 tablet Oral BID  . sertraline  50 mg Oral Daily  . sodium chloride flush  3 mL Intravenous Q12H  . sodium chloride flush  3 mL Intravenous Q12H  . topiramate  100 mg Oral QHS   Continuous Infusions: . sodium chloride    . sodium chloride Stopped (10/17/20 2313)     LOS: 7 days      Time spent: 25 minutes   Dessa Phi, DO [facility] 10/24/2020, 10:51 AM   Available via Epic secure chat 7am-7pm After these hours, please refer to coverage provider listed on [email or web address]

## 2020-10-24 NOTE — Progress Notes (Signed)
OT Cancellation Note  Patient Details Name: [name] MRN: [id number] DOB: Feb 19, 1956   Cancelled Treatment:    Reason Eval/Treat Not Completed: Medical issues which prohibited therapy: Pt again apologetically declined OT this AM due to continued nausea. Pt reports that she felt better earlier with meds and saltines, but medication seems to have worn off, and pt does not think she can tolerate any mobility. RN notified.  Julien Girt 10/24/2020, 1:36 PM

## 2020-10-24 NOTE — Plan of Care (Signed)
  Problem: Education: Goal: Knowledge of General Education information will improve Description: Including pain rating scale, medication(s)/side effects and non-pharmacologic comfort measures Outcome: Progressing   Problem: Clinical Measurements: Goal: Ability to maintain clinical measurements within normal limits will improve Outcome: Progressing   Problem: Activity: Goal: Risk for activity intolerance will decrease Outcome: Progressing   Problem: Nutrition: Goal: Adequate nutrition will be maintained Outcome: Progressing   Problem: Safety: Goal: Ability to remain free from injury will improve Outcome: Progressing   

## 2020-10-25 ENCOUNTER — Telehealth: Payer: Self-pay

## 2020-10-25 ENCOUNTER — Inpatient Hospital Stay (HOSPITAL_COMMUNITY): Payer: 59

## 2020-10-25 DIAGNOSIS — R112 Nausea with vomiting, unspecified: Secondary | ICD-10-CM

## 2020-10-25 DIAGNOSIS — M545 Low back pain, unspecified: Secondary | ICD-10-CM | POA: Diagnosis not present

## 2020-10-25 DIAGNOSIS — R110 Nausea: Secondary | ICD-10-CM

## 2020-10-25 DIAGNOSIS — I10 Essential (primary) hypertension: Secondary | ICD-10-CM

## 2020-10-25 DIAGNOSIS — K5792 Diverticulitis of intestine, part unspecified, without perforation or abscess without bleeding: Secondary | ICD-10-CM | POA: Diagnosis not present

## 2020-10-25 DIAGNOSIS — I420 Dilated cardiomyopathy: Secondary | ICD-10-CM | POA: Diagnosis not present

## 2020-10-25 DIAGNOSIS — A419 Sepsis, unspecified organism: Secondary | ICD-10-CM | POA: Diagnosis not present

## 2020-10-25 LAB — CBC
HCT: 31 % — ABNORMAL LOW (ref 36.0–46.0)
Hemoglobin: 9.9 g/dL — ABNORMAL LOW (ref 12.0–15.0)
MCH: 30 pg (ref 26.0–34.0)
MCHC: 31.9 g/dL (ref 30.0–36.0)
MCV: 93.9 fL (ref 80.0–100.0)
Platelets: 477 10*3/uL — ABNORMAL HIGH (ref 150–400)
RBC: 3.3 MIL/uL — ABNORMAL LOW (ref 3.87–5.11)
RDW: 13.5 % (ref 11.5–15.5)
WBC: 11.9 10*3/uL — ABNORMAL HIGH (ref 4.0–10.5)
nRBC: 0 % (ref 0.0–0.2)

## 2020-10-25 LAB — BASIC METABOLIC PANEL
Anion gap: 8 (ref 5–15)
BUN: 8 mg/dL (ref 8–23)
CO2: 16 mmol/L — ABNORMAL LOW (ref 22–32)
Calcium: 7.6 mg/dL — ABNORMAL LOW (ref 8.9–10.3)
Chloride: 113 mmol/L — ABNORMAL HIGH (ref 98–111)
Creatinine, Ser: 0.61 mg/dL (ref 0.44–1.00)
GFR, Estimated: >60 mL/min (ref >60)
Glucose, Bld: 84 mg/dL (ref 70–99)
Potassium: 3.5 mmol/L (ref 3.5–5.1)
Sodium: 137 mmol/L (ref 135–145)

## 2020-10-25 LAB — GLUCOSE, CAPILLARY: Glucose-Capillary: 78 mg/dL (ref 70–99)

## 2020-10-25 LAB — MAGNESIUM: Magnesium: 2 mg/dL (ref 1.7–2.4)

## 2020-10-25 MED ORDER — SODIUM CHLORIDE 0.9 % IV BOLUS
500.0000 mL | Freq: Once | INTRAVENOUS | Status: AC
  Administered 2020-10-25: 500 mL via INTRAVENOUS

## 2020-10-25 MED ORDER — HYDROCORTISONE NA SUCCINATE PF 100 MG IJ SOLR
50.0000 mg | Freq: Every day | INTRAMUSCULAR | Status: DC
  Administered 2020-10-26 – 2020-10-28 (×3): 50 mg via INTRAVENOUS
  Filled 2020-10-25 (×3): qty 2

## 2020-10-25 MED ORDER — POTASSIUM CHLORIDE CRYS ER 20 MEQ PO TBCR
40.0000 meq | EXTENDED_RELEASE_TABLET | Freq: Once | ORAL | Status: DC
  Filled 2020-10-25 (×2): qty 2

## 2020-10-25 MED ORDER — LORAZEPAM 2 MG/ML IJ SOLN
1.0000 mg | Freq: Three times a day (TID) | INTRAMUSCULAR | Status: DC | PRN
  Administered 2020-10-25 – 2020-10-28 (×5): 1 mg via INTRAVENOUS
  Filled 2020-10-25 (×5): qty 1

## 2020-10-25 MED ORDER — ENSURE ENLIVE PO LIQD
237.0000 mL | Freq: Two times a day (BID) | ORAL | Status: DC
  Administered 2020-10-27 – 2020-10-29 (×3): 237 mL via ORAL

## 2020-10-25 MED ORDER — SODIUM BICARBONATE 8.4 % IV SOLN
INTRAVENOUS | Status: DC
  Filled 2020-10-25 (×2): qty 850
  Filled 2020-10-25: qty 150

## 2020-10-25 MED ORDER — PANTOPRAZOLE SODIUM 40 MG IV SOLR
40.0000 mg | INTRAVENOUS | Status: DC
  Administered 2020-10-25 – 2020-10-28 (×4): 40 mg via INTRAVENOUS
  Filled 2020-10-25 (×4): qty 40

## 2020-10-25 MED ORDER — PANTOPRAZOLE SODIUM 40 MG PO TBEC: 40.0000 mg | DELAYED_RELEASE_TABLET | Freq: Every day | ORAL | Status: DC

## 2020-10-25 MED ORDER — PROCHLORPERAZINE EDISYLATE 10 MG/2ML IJ SOLN
10.0000 mg | Freq: Once | INTRAMUSCULAR | Status: AC
  Administered 2020-10-25: 10 mg via INTRAVENOUS
  Filled 2020-10-25: qty 2

## 2020-10-25 MED ORDER — SODIUM CHLORIDE 0.9 % IV SOLN: INTRAVENOUS | Status: DC

## 2020-10-25 MED ORDER — ADULT MULTIVITAMIN W/MINERALS CH
1.0000 | ORAL_TABLET | Freq: Every day | ORAL | Status: DC
  Administered 2020-10-26 – 2020-10-29 (×4): 1 via ORAL
  Filled 2020-10-25 (×4): qty 1

## 2020-10-25 MED ORDER — BOOST / RESOURCE BREEZE PO LIQD CUSTOM
1.0000 | Freq: Two times a day (BID) | ORAL | Status: DC
  Administered 2020-10-25 – 2020-10-28 (×4): 1 via ORAL

## 2020-10-25 MED ORDER — POTASSIUM CHLORIDE CRYS ER 20 MEQ PO TBCR
20.0000 meq | EXTENDED_RELEASE_TABLET | Freq: Once | ORAL | Status: AC
  Administered 2020-10-25: 20 meq via ORAL
  Filled 2020-10-25: qty 1

## 2020-10-25 MED ORDER — PIPERACILLIN-TAZOBACTAM 3.375 G IVPB
3.3750 g | Freq: Three times a day (TID) | INTRAVENOUS | Status: AC
  Administered 2020-10-25 – 2020-10-27 (×8): 3.375 g via INTRAVENOUS
  Filled 2020-10-25 (×8): qty 50

## 2020-10-25 MED ORDER — METOPROLOL TARTRATE 5 MG/5ML IV SOLN
2.5000 mg | Freq: Three times a day (TID) | INTRAVENOUS | Status: DC
  Administered 2020-10-25 – 2020-10-26 (×3): 2.5 mg via INTRAVENOUS
  Filled 2020-10-25 (×3): qty 5

## 2020-10-25 MED ORDER — PROCHLORPERAZINE EDISYLATE 10 MG/2ML IJ SOLN
10.0000 mg | Freq: Four times a day (QID) | INTRAMUSCULAR | Status: DC | PRN
  Administered 2020-10-25 – 2020-10-29 (×7): 10 mg via INTRAVENOUS
  Filled 2020-10-25 (×7): qty 2

## 2020-10-25 MED ORDER — IOHEXOL 300 MG/ML  SOLN
100.0000 mL | Freq: Once | INTRAMUSCULAR | Status: AC | PRN
  Administered 2020-10-25: 100 mL via INTRAVENOUS

## 2020-10-25 NOTE — Progress Notes (Signed)
Nutrition Follow-up  DOCUMENTATION CODES:   Not applicable  INTERVENTION:  - will decrease Boost Breeze from TID to BID, each supplement provides 250 kcal and 9 grams of protein. - will order Ensure Enlive BID, each supplement provides 350 kcal and 20 grams of protein. - contacted MD via secure chat concerning patient's request for antacid order.    NUTRITION DIAGNOSIS:   Increased nutrient needs related to acute illness as evidenced by estimated needs. -ongoing  GOAL:   Patient will meet greater than or equal to 90% of their needs -unmet  MONITOR:   PO intake,Supplement acceptance,Labs,Weight trends,I & O's  ASSESSMENT:   65 y.o. F with PMH significant for HFrEF 35%, COPD, CAD, Cardiomyopathy with ICD, depression and recurrent incisional hernias s/p repair, hypothyroidism who presented with 3 weeks of lower abdominal pain and CT showing colo-colonic fistulas.  Diet advanced from NPO to CLD on 2/23 at 1014, to [place] on 2/25 at 0742, and to Soft on 2/26 at 0935. She consumed 50% of lunch and 10% of dinner on 2/25; 10% of lunch on 2/26; 10% of breakfast, 0% of lunch, and 50% of dinner on 2/28.  She has been accepting Boost Breeze 50-75% of the time offered. She likes chocolate Ensure.   Patient documented to have had an episode of vomiting last night. Patient very nauseated at the time of visit. She confirms episode from last night and that she has vomited this AM.   She reports that she had peaches and a few crackers for dinner last night. She has not attempted breakfast but requested that RD order her soft, cold fruit to try.   Weight has been stable for the past 4 days; will continue to monitor. Non-pitting edema to BLE documented in the edema section of flow sheet.    Labs reviewed; CBG: 78 mg/d, Cl: 113 mmol/l, Ca: 7.6 mg/dl. Medications reviewed; 20 mEq Klor-Con x1 dose 3/2, 40 mEq Klor-Con x1 dose 3/2.    Diet Order:   Diet Order            DIET SOFT Room service  appropriate? Yes; Fluid consistency: Thin  Diet effective now                 EDUCATION NEEDS:   No education needs have been identified at this time  Skin:  Skin Assessment: Reviewed RN Assessment  Last BM:  3/1  Height:   Ht Readings from Last 1 Encounters:  10/17/20 5' (1.524 m)    Weight:   Wt Readings from Last 1 Encounters:  10/23/20 65.5 kg     Estimated Nutritional Needs:  Kcal:  1550-1750 Protein:  75-90g Fluid:  1.8L/day      Jarome Matin, MS, RD, LDN, CNSC Inpatient Clinical Dietitian RD pager # available in AMION  After hours/weekend pager # available in [facility]

## 2020-10-25 NOTE — Progress Notes (Signed)
OT Cancellation Note  Patient Details Name: [name] MRN: [id number] DOB: Dec 09, 1955   Cancelled Treatment:    Reason Eval/Treat Not Completed: Other (comment). Patient reporting dizziness and nausea this a.m. and awaiting medication. Will f/u as able.  Vonda L Carmack 10/25/2020, 12:06 PM

## 2020-10-25 NOTE — Progress Notes (Signed)
PROGRESS NOTE    [name]  XNA:[id number] DOB: 08-29-1955 DOA: 10/17/2020 PCP: Lillard Anes, MD    Chief Complaint  Patient presents with  . Abdominal Pain    Brief Narrative:  [name] a 65 y.o.femalewith medical history significant ofarthritis, cardiomyopathy, chronic back pain, COPD, chronic narcotic use, chronic knee pains, chronic systolic congestive heart failure,dual ICD, hyperlipidemia, hypertension, GERD, major depressive disorder, PTSD, recurrent incisional hernia repair status post flap repair 03/15/2015 who presented with abdominal pain x3 weeks intermittent,associated with intermittent nausea but no vomiting. She was seen by PCP in [place] 10/16/20 and return for complete abdominal CT reading "Abnormal posterior, proximal sigmoid multiple colocolonic fistula and adhesion adjacent to a loop, wall thickening, consistent with acute diverticulitis or other infectious or inflammatory colitis."  She was started on empiric IV antibiotics and admitted to the hospital.  She remained hypotensive and was transferred to ICU for pressors.  PCCM, general surgery, GI consulted.  Patient continued to have clinical improvement.  Weaned off pressors.  She was resumed on her home [place].  Patient with significant nausea vomiting over the past 48 hours.  Noted to initially start after physical therapy with associated dizziness, nausea and vomiting with abdominal pain which she believes is secondary to emesis.   Assessment & Plan:   Principal Problem:   Sepsis ([place]) Active Problems:   COPD (chronic obstructive pulmonary disease) (HCC)   Cardiomyopathy (HCC)   Chronic back pain   GERD (gastroesophageal reflux disease)   Chronic systolic congestive heart failure, NYHA class 2 (HCC)   Dilated cardiomyopathy ([place])   ICD (implantable cardioverter-defibrillator) in place   Dyslipidemia   Essential hypertension   Hypotension   Abdominal pain   Acute diverticulitis    Colonic fistula  1 septic shock secondary to acute diverticulitis as well as colocolonic fistula Patient had presented with criteria for sepsis with endorgan damage including AKI, hypotension requiring pressors. -Currently off pressors, PCCM was following but have signed off. -Patient assessed by GI did not feel patient's presentation typical of Crohn's/IBD.  Likely to be complication from underlying progressive and severe diverticular disease.  Inflammatory markers ordered.  GI following peripherally. -Patient seen in consultation by general surgery and no surgical intervention needed at this time. -CRP of 115.26, sed rate 52. -CRP and sed rate trended down. -Blood cultures done 10/17/2020 with single site showing staph epidermidis likely contaminant.  Patient did receive a dose of IV vancomycin as patient had presented in shock. -Repeat blood cultures 10/18/2020 was negative.  Vancomycin discontinued. -Was on IV cefepime, Flagyl and deescalated to Augmentin. -Was on IV Solu-Cortef and wean back to home regimen of Florinef. -WBC was elevated but slowly trending down. -Patient with ongoing nausea and emesis for the past 24 to 48 hours with inability to tolerate oral intake including medications per RN. -Abdominal films done 10/23/2020 negative for obstruction. -Check a CT abdomen and pelvis -Compazine 10 mg IV every 6 hours as needed nausea and vomiting if uncontrolled by Zofran. -Discontinue Phenergan -May use IV Ativan as needed for intractable nausea vomiting not controlled by Compazine or Zofran. -Discontinue Augmentin and placed on IV Zosyn until nausea vomiting has resolved and patient tolerating oral intake and could likely transition back to Augmentin. -.  Gentle IV fluid hydration with bicarb drip.  Follow.  2.  Metabolic acidosis Likely secondary to intractable nausea and vomiting.  Placed on bicarb drip.  IV Compazine as needed.  Discontinue Phenergan.  IV Zofran.  3.  Intractable  nausea vomiting Patient with intractable nausea and vomiting for the past 24 to 48 hours.  Patient with ongoing emesis noted this morning. -Check a CT abdomen and pelvis. -Discontinue IV Phenergan. -IV Compazine 10 mg every 6 hours as needed for nausea and vomiting unrelieved with IV Zofran as needed. -IV PPI. -Gentle hydration. -Supportive care.  4.  Chronic systolic CHF/cardiomyopathy with biventricular ICD -2D echo with EF of 35 to 40% (12/15/2019).- -Currently compensated and likely on the dry side due to ongoing nausea and vomiting. -Gentle hydration. -Continue Entresto.  Ranexa and Lasix (prn) on hold. -Discontinue Coreg and placed on low-dose IV Lopressor due to ongoing nausea and vomiting. -Monitor closely with gentle hydration.  5.  Acute kidney injury Resolved.  6.  Anxiety Hold Topamax due to ongoing acidosis.  Continue Zoloft as needed.  DC Valium and placed on IV Ativan as needed due to ongoing nausea and vomiting.  7.  Chronic pain Continue current pain regimen with Neurontin and oxycodone.  8.  Hyperlipidemia Statin.  9.  Lightheadedness/dizziness Likely secondary to volume depletion, orthostasis from ongoing nausea and vomiting x48 hours.  Initial concern was for vertigo.  Check orthostatics.  Meclizine as needed.  -   DVT prophylaxis: Heparin Code Status: Full Family Communication: Updated patient.  No family at bedside. Disposition:   Status is: Inpatient    Dispo: The patient is from: Home              Anticipated d/c is to: Likely SNF              Patient currently with ongoing nausea, vomiting, dehydrated, unable to tolerate oral intake.  Not stable for discharge.   Difficult to place patient undetermined       Consultants:   PCCM: Dr. Elsworth Soho 10/17/2020  General surgery: Dr.Gerkin 10/17/2020  Gastroenterology: Dr. Therisa Doyne 10/18/2020  Procedures:   Abdominal films 10/23/2020  Antimicrobials:   Augmentin 10/23/2020 >>>> 10/25/2020  IV  cefepime 10/17/2020>>>> 10/23/2020  IV Cipro 2/22/ 2022x1  IV Flagyl 10/17/2020>>>> 10/23/2020  IV Zosyn 10/25/2020>>>>>  IV vancomycin 10/18/2020>>>> 10/19/2020   Subjective: Patient at bedside actively vomiting and emesis bag.  Patient stated vomited throughout the day yesterday.  Patient started with emesis this morning uncontrolled with IV Zofran and Phenergan as needed.  Patient denies any shortness of breath.  No chest pain.  Patient with some complaints of lower abdominal pain.  Patient stated had a bowel movement this morning.  Objective: Vitals:   10/24/20 1313 10/24/20 1950 10/25/20 0234 10/25/20 0622  BP:  128/78 107/66   Pulse:  72 70   Resp:  16 16   Temp: 98.2 F (36.8 C) 99 F (37.2 C) 98.3 F (36.8 C)   TempSrc: Oral Oral Oral   SpO2: 97% 97% 97% 94%  Weight:      Height:        Intake/Output Summary (Last 24 hours) at 10/25/2020 1153 Last data filed at 10/25/2020 0500 Gross per 24 hour  Intake 360 ml  Output 300 ml  Net 60 ml   Filed Weights   10/21/20 0525 10/22/20 0500 10/23/20 0500  Weight: 66.5 kg 64.6 kg 65.5 kg    Examination:  General exam: Actively vomiting.  Respiratory system: Clear to auscultation. Respiratory effort normal. Cardiovascular system: S1 & S2 heard, RRR. No JVD, murmurs, rubs, gallops or clicks. No pedal edema. Gastrointestinal system: Abdomen is nondistended, soft and tender to palpation greater in the left lower quadrant and lower abdominal  region.  Positive bowel sounds.  No rebound.  No guarding.  Central nervous system: Alert and oriented. No focal neurological deficits. Extremities: Symmetric 5 x 5 power. Skin: No rashes, lesions or ulcers Psychiatry: Judgement and insight appear normal. Mood & affect appropriate.     Data Reviewed: I have personally reviewed following labs and imaging studies  CBC: Recent Labs  Lab 10/21/20 0415 10/22/20 0523 10/23/20 0446 10/24/20 0451 10/25/20 0440  WBC 14.4* 15.0* 13.7* 13.3*  11.9*  HGB 8.8* 9.7* 9.6* 9.9* 9.9*  HCT 27.5* 30.0* 29.3* 30.6* 31.0*  MCV 94.8 92.0 93.3 93.3 93.9  PLT 397 452* 426* 474* 477*    Basic Metabolic Panel: Recent Labs  Lab 10/19/20 0239 10/20/20 0446 10/21/20 0415 10/22/20 0523 10/24/20 0451 10/25/20 0440  NA 141 140 141 141 142 137  K 3.9 3.4* 3.9 4.4 3.6 3.5  CL 114* 114* 114* 114* 116* 113*  CO2 17* 20* 21* 22 20* 16*  GLUCOSE 111* 121* 89 89 86 84  BUN 14 14 14 14 9 8   CREATININE 0.75 0.78 0.69 0.69 0.65 0.61  CALCIUM 7.3* 7.4* 7.5* 8.1* 8.0* 7.6*  MG 1.6* 3.0*  --   --   --  2.0    GFR: Estimated Creatinine Clearance: 58.4 mL/min (by C-G formula based on SCr of 0.61 mg/dL).  Liver Function Tests: No results for input(s): AST, ALT, ALKPHOS, BILITOT, PROT, ALBUMIN in the last 168 hours.  CBG: Recent Labs  Lab 10/21/20 0744 10/22/20 0731 10/23/20 0739 10/24/20 0748 10/25/20 0739  GLUCAP 88 91 77 90 78     Recent Results (from the past 240 hour(s))  Culture, blood (routine x 2)     Status: Abnormal   Collection Time: 10/17/20  1:15 PM   Specimen: BLOOD RIGHT FOREARM  Result Value Ref Range Status   Specimen Description   Final    BLOOD RIGHT FOREARM Performed at [facility] [address] [place] [address] [place], [place] [address]    Special Requests   Final    BOTTLES DRAWN AEROBIC AND ANAEROBIC Blood Culture adequate volume Performed at [facility] [address] [place], [place] [address]    Culture  Setup Time   Final    GRAM POSITIVE COCCI IN CLUSTERS ANAEROBIC BOTTLE ONLY Organism ID to follow CRITICAL RESULT CALLED TO, READ BACK BY AND VERIFIED WITH: Sheffield Slider PHARMD [id number] 10/18/20 A BROWNING    Culture (A)  Final    STAPHYLOCOCCUS EPIDERMIDIS THE SIGNIFICANCE OF ISOLATING THIS ORGANISM FROM A SINGLE SET OF BLOOD CULTURES WHEN MULTIPLE SETS ARE DRAWN IS UNCERTAIN. PLEASE NOTIFY THE MICROBIOLOGY DEPARTMENT WITHIN ONE WEEK IF SPECIATION AND SENSITIVITIES ARE  REQUIRED. Performed at [facility], [place] [address], [place], [place] [address]    Report Status 10/20/2020 FINAL  Final  Blood Culture ID Panel (Reflexed)     Status: Abnormal   Collection Time: 10/17/20  1:15 PM  Result Value Ref Range Status   Enterococcus faecalis NOT DETECTED NOT DETECTED Final   Enterococcus Faecium NOT DETECTED NOT DETECTED Final   Listeria monocytogenes NOT DETECTED NOT DETECTED Final   Staphylococcus species DETECTED (A) NOT DETECTED Final    Comment: CRITICAL RESULT CALLED TO, READ BACK BY AND VERIFIED WITH: Sheffield Slider PHARMD [id number] 10/18/20 A BROWNING    Staphylococcus aureus (BCID) NOT DETECTED NOT DETECTED Final   Staphylococcus epidermidis DETECTED (A) NOT DETECTED Final    Comment: Methicillin (oxacillin) resistant coagulase negative staphylococcus. Possible blood culture  contaminant (unless isolated from more than one blood culture draw or clinical case suggests pathogenicity). No antibiotic treatment is indicated for blood  culture contaminants. CRITICAL RESULT CALLED TO, READ BACK BY AND VERIFIED WITH: Sheffield Slider PHARMD [id number] 10/18/20 A BROWNING    Staphylococcus lugdunensis NOT DETECTED NOT DETECTED Final   Streptococcus species NOT DETECTED NOT DETECTED Final   Streptococcus agalactiae NOT DETECTED NOT DETECTED Final   Streptococcus pneumoniae NOT DETECTED NOT DETECTED Final   Streptococcus pyogenes NOT DETECTED NOT DETECTED Final   A.calcoaceticus-baumannii NOT DETECTED NOT DETECTED Final   Bacteroides fragilis NOT DETECTED NOT DETECTED Final   Enterobacterales NOT DETECTED NOT DETECTED Final   Enterobacter cloacae complex NOT DETECTED NOT DETECTED Final   Escherichia coli NOT DETECTED NOT DETECTED Final   Klebsiella aerogenes NOT DETECTED NOT DETECTED Final   Klebsiella oxytoca NOT DETECTED NOT DETECTED Final   Klebsiella pneumoniae NOT DETECTED NOT DETECTED Final   Proteus species NOT DETECTED NOT DETECTED Final   Salmonella species NOT  DETECTED NOT DETECTED Final   Serratia marcescens NOT DETECTED NOT DETECTED Final   Haemophilus influenzae NOT DETECTED NOT DETECTED Final   Neisseria meningitidis NOT DETECTED NOT DETECTED Final   Pseudomonas aeruginosa NOT DETECTED NOT DETECTED Final   Stenotrophomonas maltophilia NOT DETECTED NOT DETECTED Final   Candida albicans NOT DETECTED NOT DETECTED Final   Candida auris NOT DETECTED NOT DETECTED Final   Candida glabrata NOT DETECTED NOT DETECTED Final   Candida krusei NOT DETECTED NOT DETECTED Final   Candida parapsilosis NOT DETECTED NOT DETECTED Final   Candida tropicalis NOT DETECTED NOT DETECTED Final   Cryptococcus neoformans/gattii NOT DETECTED NOT DETECTED Final   Methicillin resistance mecA/C DETECTED (A) NOT DETECTED Final    Comment: CRITICAL RESULT CALLED TO, READ BACK BY AND VERIFIED WITH: Sheffield Slider [facility] [id number] 10/18/20 A BROWNING Performed at [facility], [address] [place], [place] [address]   Culture, blood (routine x 2)     Status: None   Collection Time: 10/17/20  1:22 PM   Specimen: BLOOD  Result Value Ref Range Status   Specimen Description   Final    BLOOD SITE NOT SPECIFIED Performed at [facility], [address] [place], [place] [address]    Special Requests   Final    BOTTLES DRAWN AEROBIC AND ANAEROBIC Blood Culture results may not be optimal due to an inadequate volume of blood received in culture bottles Performed at [facility] [address] [place], [place] [address]    Culture   Final    NO GROWTH 5 DAYS Performed at [facility], [place] [address], [place], [place] [address]    Report Status 10/22/2020 FINAL  Final  SARS CORONAVIRUS 2 (TAT 6-24 HRS) Nasopharyngeal Nasopharyngeal Swab     Status: None   Collection Time: 10/17/20  5:24 PM   Specimen: Nasopharyngeal Swab  Result Value Ref Range Status   SARS Coronavirus 2 NEGATIVE NEGATIVE Final    Comment: (NOTE) SARS-CoV-2 target nucleic  acids are NOT DETECTED.  The SARS-CoV-2 RNA is generally detectable in upper and lower respiratory specimens during the acute phase of infection. Negative results do not preclude SARS-CoV-2 infection, do not rule out co-infections with other pathogens, and should not be used as the sole basis for treatment or other patient management decisions. Negative results must be combined with clinical observations, patient history, and epidemiological information. The expected result is Negative.  Fact Sheet for Patients:  SugarRoll.be  Fact Sheet for Healthcare Providers: https://www.woods-mathews.com/  This test is not yet approved or cleared by the [place] FDA and  has been authorized for detection and/or diagnosis of SARS-CoV-2 by FDA under an Emergency Use Authorization (EUA). This EUA will remain  in effect (meaning this test can be used) for the duration of the COVID-19 declaration under Se ction 564(b)(1) of the Act, 21 U.S.C. section 360bbb-3(b)(1), unless the authorization is terminated or revoked sooner.  Performed at [facility], [place] [address], [place], [place] [address]   MRSA PCR Screening     Status: None   Collection Time: 10/17/20 10:46 PM   Specimen: Nasopharyngeal  Result Value Ref Range Status   MRSA by PCR NEGATIVE NEGATIVE Final    Comment:        The GeneXpert MRSA Assay (FDA approved for NASAL specimens only), is one component of a comprehensive MRSA colonization surveillance program. It is not intended to diagnose MRSA infection nor to guide or monitor treatment for MRSA infections. Performed at [facility], [place] [address], [place], [place] [address]   Culture, blood (Routine X 2) w Reflex to ID Panel     Status: None   Collection Time: 10/18/20  2:14 PM   Specimen: BLOOD RIGHT HAND  Result Value Ref Range Status   Specimen Description   Final    BLOOD RIGHT HAND Performed at  [facility] [address], [place], [place] [address]    Special Requests   Final    BOTTLES DRAWN AEROBIC ONLY Blood Culture adequate volume Performed at [facility] [address], [place], [place] [address]    Culture   Final    NO GROWTH 5 DAYS Performed at [facility], [place] [address] [place], [place] [address]    Report Status 10/23/2020 FINAL  Final  Culture, blood (Routine X 2) w Reflex to ID Panel     Status: None   Collection Time: 10/18/20  2:14 PM   Specimen: BLOOD LEFT HAND  Result Value Ref Range Status   Specimen Description   Final    BLOOD LEFT HAND Performed at [facility] [address] [place], [place] [address]    Special Requests   Final    BOTTLES DRAWN AEROBIC ONLY Blood Culture adequate volume Performed at [facility] [address] [place], [place] [address]    Culture   Final    NO GROWTH 5 DAYS Performed at [facility], [place] [address] [place], [place] [address]    Report Status 10/23/2020 FINAL  Final         Radiology Studies: DG Abd 1 View  Result Date: 10/23/2020 CLINICAL DATA:  Nausea and vomiting EXAM: ABDOMEN - 1 VIEW COMPARISON:  03/27/2015 FINDINGS: Scattered large and small bowel gas is noted. No obstructive changes are seen. No free air is noted. Postsurgical changes are seen in the right hip. Defibrillator is noted. IMPRESSION: No obstructive changes seen. Electronically Signed   By: Inez Catalina M.D.   On: 10/23/2020 17:44        Scheduled Meds: . amoxicillin-clavulanate  1 tablet Oral Q12H  . carvedilol  3.125 mg Oral BID  . celecoxib  200 mg Oral q morning  . feeding supplement  1 Container Oral BID BM  . feeding supplement  237 mL Oral BID BM  . fludrocortisone  0.1 mg Oral Daily  . fluticasone  furoate-vilanterol  1 puff Inhalation Q1500  . heparin  5,000 Units Subcutaneous Q8H  . mouth rinse  15 mL Mouth Rinse BID  .  multivitamin with minerals  1 tablet Oral Daily  . pantoprazole  40 mg Oral Q0600  . potassium chloride  40 mEq Oral Once  . prochlorperazine  10 mg Intravenous Once  . rosuvastatin  20 mg Oral q morning  . sacubitril-valsartan  1 tablet Oral BID  . sertraline  50 mg Oral Daily  . sodium chloride flush  3 mL Intravenous Q12H  . sodium chloride flush  3 mL Intravenous Q12H  . topiramate  100 mg Oral QHS   Continuous Infusions: . sodium chloride    . sodium chloride Stopped (10/17/20 2313)  . sodium chloride       LOS: 8 days    Time spent: 45 minutes    Irine Seal, MD [facility]   To contact the attending provider between 7A-7P or the covering provider during after hours 7P-7A, please log into the web site [email or web address] and access using universal [place] password for that web site. If you do not have the password, please call the hospital operator.  10/25/2020, 11:53 AM

## 2020-10-25 NOTE — Progress Notes (Signed)
Chronic Care Management Pharmacy Assistant   Name: ALANE HANSSEN  MRN: [id number] DOB: March 26, 1956  Reason for Encounter: Medication Review for Upstream delivery  Patient Questions:  1.  Have you seen any other providers since your last visit?  10/17/20-present- hospital admission for diverticulitis    2.  Any changes in your medicines or health?  Diverticulitis   PCP : Lillard Anes, MD  Allergies:  No Known Allergies  Medications: Facility-Administered Encounter Medications as of 10/25/2020  Medication  . 0.9 %  sodium chloride infusion  . 0.9 %  sodium chloride infusion  . acetaminophen (TYLENOL) tablet 650 mg   Or  . acetaminophen (TYLENOL) suppository 650 mg  . celecoxib (CELEBREX) capsule 200 mg  . feeding supplement (BOOST / RESOURCE BREEZE) liquid 1 Container  . feeding supplement (ENSURE ENLIVE / ENSURE PLUS) liquid 237 mL  . fludrocortisone (FLORINEF) tablet 0.1 mg  . fluticasone furoate-vilanterol (BREO ELLIPTA) 100-25 MCG/INH 1 puff  . gabapentin (NEURONTIN) capsule 800 mg  . heparin injection 5,000 Units  . hydrocortisone sodium succinate (SOLU-CORTEF) 100 MG injection 50 mg  . HYDROmorphone (DILAUDID) injection 0.5-1 mg  . ipratropium (ATROVENT) nebulizer solution 0.5 mg  . LORazepam (ATIVAN) injection 1 mg  . magnesium citrate solution 1 Bottle  . meclizine (ANTIVERT) tablet 12.5 mg  . MEDLINE mouth rinse  . metoprolol tartrate (LOPRESSOR) injection 2.5 mg  . multivitamin with minerals tablet 1 tablet  . nitroGLYCERIN (NITROSTAT) SL tablet 0.4 mg  . ondansetron (ZOFRAN) tablet 4 mg   Or  . ondansetron (ZOFRAN) injection 4 mg  . oxyCODONE (Oxy IR/ROXICODONE) immediate release tablet 10 mg  . pantoprazole (PROTONIX) injection 40 mg  . piperacillin-tazobactam (ZOSYN) IVPB 3.375 g  . potassium chloride SA (KLOR-CON) CR tablet 40 mEq  . prochlorperazine (COMPAZINE) injection 10 mg  . rosuvastatin (CRESTOR) tablet 20 mg  . sacubitril-valsartan  (ENTRESTO) 24-26 mg per tablet  . sertraline (ZOLOFT) tablet 50 mg  . sodium bicarbonate 150 mEq in dextrose 5 % 1,000 mL infusion  . sodium chloride flush (NS) 0.9 % injection 3 mL  . sodium chloride flush (NS) 0.9 % injection 3 mL  . sodium chloride flush (NS) 0.9 % injection 3 mL  . SUMAtriptan (IMITREX) tablet 50 mg   Outpatient Encounter Medications as of 10/25/2020  Medication Sig  . aspirin 81 MG tablet Take 81 mg by mouth daily.  . Buprenorphine HCl (BELBUCA) 900 MCG FILM Place 900 mcg inside cheek every 12 (twelve) hours.  . carvedilol (COREG) 3.125 MG tablet TAKE 1 TABLET BY MOUTH TWICE DAILY  . celecoxib (CELEBREX) 200 MG capsule TAKE ONE CAPSULE BY MOUTH EVERY MORNING  . cyclobenzaprine (FLEXERIL) 10 MG tablet Take 10 mg by mouth 3 (three) times daily as needed for muscle spasms.  Marland Kitchen denosumab (PROLIA) 60 MG/ML SOSY injection Inject 60 mg into the skin every 6 (six) months.  . diazepam (VALIUM) 5 MG tablet Take 1 tablet (5 mg total) by mouth 3 (three) times daily as needed. (Patient taking differently: Take 5 mg by mouth 3 (three) times daily as needed for anxiety.)  . diphenoxylate-atropine (LOMOTIL) 2.5-0.025 MG tablet Take 1 tablet by mouth 4 (four) times daily as needed for diarrhea or loose stools.  Marland Kitchen ENTRESTO 24-26 MG TAKE ONE TABLET BY MOUTH EVERY MORNING and TAKE ONE TABLET BY MOUTH EVERY EVENING  . EPINEPHRINE 0.3 mg/0.3 mL IJ SOAJ injection Inject 0.3 mLs (0.3 mg total) into the muscle as needed for anaphylaxis.  Marland Kitchen  esomeprazole (NEXIUM) 20 MG capsule Take 1 capsule (20 mg total) by mouth daily.  . fludrocortisone (FLORINEF) 0.1 MG tablet TAKE 1 TABLET BY MOUTH AS DIRECTED. (Patient taking differently: Take 0.1 mg by mouth daily.)  . fluticasone (FLONASE) 50 MCG/ACT nasal spray Place 2 sprays into both nostrils daily as needed (congestion).  . fluticasone furoate-vilanterol (BREO ELLIPTA) 100-25 MCG/INH AEPB Inhale 1 puff into the lungs daily in the afternoon.  . furosemide  (LASIX) 40 MG tablet TAKE ONE TABLET BY MOUTH EVERY MORNING MAY take 0.5 tablet in THE afternoon IF needed (Patient taking differently: Take 20 mg by mouth daily as needed for fluid.)  . gabapentin (NEURONTIN) 800 MG tablet Take 1 tablet (800 mg total) by mouth 3 (three) times daily. Patient usually takes twice a day (Patient taking differently: Take 800 mg by mouth 2 (two) times daily as needed (pain).)  . ipratropium (ATROVENT) 0.02 % nebulizer solution Take 2.5 mLs (0.5 mg total) by nebulization every 4 (four) hours as needed for wheezing or shortness of breath.  . levocetirizine (XYZAL) 5 MG tablet Take 5 mg by mouth daily as needed for allergies.  Marland Kitchen LINZESS 145 MCG CAPS capsule Take 145 mcg by mouth daily as needed (indigestion).  . nitroGLYCERIN (NITROSTAT) 0.4 MG SL tablet Place 1 tablet (0.4 mg total) under the tongue every 5 (five) minutes as needed for chest pain.  Marland Kitchen ondansetron (ZOFRAN) 4 MG tablet Take 1 tablet (4 mg total) by mouth every 8 (eight) hours as needed for nausea or vomiting.  . Oxycodone HCl 10 MG TABS Take 1 tablet by mouth every 4 (four) hours as needed (pain).  . ranolazine (RANEXA) 500 MG 12 hr tablet TAKE 1 TABLET(500 MG) BY MOUTH TWICE DAILY  . rosuvastatin (CRESTOR) 20 MG tablet TAKE ONE TABLET BY MOUTH EVERY MORNING  . sertraline (ZOLOFT) 100 MG tablet Take 0.5 tablets (50 mg total) by mouth daily.  Marland Kitchen tiZANidine (ZANAFLEX) 4 MG tablet Take 4 mg by mouth at bedtime.  . topiramate (TOPAMAX) 50 MG tablet Take 2 tablets (100 mg total) by mouth at bedtime.  Marland Kitchen Ubrogepant (UBRELVY) 100 MG TABS Take 1 tablet by mouth as needed (May repeat 1 tablet after 2 hours if needed.  Maximum 2 tablets in 24 hours). (Patient taking differently: Take 1 tablet by mouth every 2 (two) hours as needed (May repeat 1 tablet after 2 hours if needed.  Maximum 2 tablets in 24 hours).)    Current Diagnosis: Patient Active Problem List   Diagnosis Date Noted  . Mixed incontinence 10/20/2020  .  Malnutrition of moderate degree ([place]) 10/20/2020  . Abdominal pain 10/17/2020  . Sepsis ([place]) 10/17/2020  . Acute diverticulitis 10/17/2020  . 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 Mesenteric ischemia ([place]) 10/16/2020  . Thyroid disease   . PTSD (post-traumatic stress disorder)   . 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[place]) 07/13/2020  . Dehydration 05/17/2020  . Hypotension 05/17/2020  . Persistent vomiting 05/17/2020  . Diarrhea 05/17/2020  . Cystitis 05/17/2020  . BMI 26.0-26.9,adult 03/29/2020  . Obstructive chronic bronchitis with exacerbation ([place]) 10/25/2019  . Admission for long-term opiate analgesic use 10/24/2019  . Presence of left artificial hip joint 10/24/2019  . Senile osteoporosis 10/24/2019  . Chronic hypoxemic respiratory failure ([place]) 10/24/2019  . Other spondylosis with radiculopathy, lumbar region 10/24/2019  . Major depressive disorder, single episode, moderate ([place]) 10/24/2019  . Migraine without aura with status migrainosus 10/24/2019  . 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 Chronic pain of both knees 12/21/2018  .  Chronic systolic congestive heart failure, NYHA class 2 ([place]) 06/19/2017  . Dilated cardiomyopathy ([place]) 06/19/2017  . 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 Arthritis of right hip 05/29/2016  . Coronary artery disease involving native coronary artery of native heart without angina pectoris 05/31/2015  . Dyslipidemia 05/31/2015  . Essential hypertension 05/31/2015  . Chronic narcotic use 03/23/2015  . GERD (gastroesophageal reflux disease)   . 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 COPD (chronic obstructive pulmonary disease) ([place])   . Cardiomyopathy ([place])   . Chronic back pain    Reviewed chart for medication changes ahead of medication coordination call.  No OVs, Consults, or hospital visits since last care  coordination call/Pharmacist visit. (If appropriate, list visit date, provider name)  No medication changes indicated OR if recent visit, treatment plan here.  BP Readings from Last 3 Encounters:  10/25/20 107/66  10/16/20 (!) 80/58  10/05/20 104/72    No results found for: HGBA1C   Patient obtains medications through Vials  90 Days   Last adherence delivery included: (medication name and frequency) Xyzal 5 mg 1 tablet daily Diazepam 5 mg 1 tablet 3 times a day as needed Topiramate 50 mg 2 tablets at bedtime Esomeprazole 20 mg 1 capsule before breakfast daily Rosuvastatin 20 mg1 tablet in the morning Breo Ellipta 100-25 mcg Celebrex 200 mg  Gabapentin 800 mg  Entresto 24-26 mg  Furosemide 40 mg   Patient declined (meds) last month due to PRN use/additional supply on hand. Epi Pen- PRN use Prolia - Has6 months left Nitroglycerin sub 0.4 mg- PRN use Fluticasone - PRN use Ipratropium Bromide- States "another company fills this for her" Alendronate 70 mg -PCP discontinued Sertraline 50mg - Had enough on hand                     Linzess 145 mcg - Had enough on hand  Patient is due for next adherence delivery on: 10/04/20 (90DS)  Called patient and reviewed medications and coordinated delivery.  This delivery to include: None due to getting 90ds on 10/04/20  No acute or short fill needed  Patient needs refills for None  Confirmed delivery date of 10/04/20, advised patient that pharmacy will contact them the morning of delivery.  A no fill form was completed for 10/25/20 due to recent 90DS, for Donette Larry, CPP approval     Follow-Up:  Coordination of Enhanced Pharmacy Services and Pharmacist Review  Donette Larry, [place], [place] Pharmacist Assistant [phone]

## 2020-10-25 NOTE — Progress Notes (Signed)
Occupational Therapy Treatment Patient Details Name: [name] MRN: [id number] DOB: 11-29-1955 Today's Date: 10/25/2020    History of present illness 65 y.o. female with PMH of arthritis, cardiomyopathy, chronic back pain, COPD, chronic narcotic use, chronic knee pains, chronic systolic congestive heart failure, dual ICD, hyperlipidemia, hypertension, GERD, major depressive disorder, PTSD, recurrent incisional hernia repair status post flap repair 03/15/2015 . Presenting with abdominal pain x3 weeks. 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   OT comments  Patient supervision to transfer to side of bed and min guard to ambulate with RW. Patient able to perform toilet transfer and toileting with min guard. Patient reports improvement of dizziness and supervision. Patient reports having all DME needed at home and a caregiver during the day and her boyfriend in the evening. Expect no OT needs at discharge.   Follow Up Recommendations  No OT follow up    Equipment Recommendations  None recommended by OT    Recommendations for Other Services      Precautions / Restrictions Precautions Precautions: Fall Precaution Comments: dizzy with mobility, now taking antirvert Restrictions Weight Bearing Restrictions: No       Mobility Bed Mobility Overal bed mobility: Needs Assistance Bed Mobility: Supine to Sit   Sidelying to sit: Supervision;HOB elevated            Transfers Overall transfer level: Needs assistance Equipment used: Rolling walker (2 wheeled) Transfers: Sit to/from [facility] Sit to Stand: Min guard Stand pivot transfers: Min guard            Balance Overall balance assessment: Needs assistance Sitting-balance support: No upper extremity supported;Feet unsupported Sitting balance-Leahy Scale: Good     Standing balance support: Bilateral upper extremity supported;During functional  activity Standing balance-Leahy Scale: Fair                             ADL either performed or assessed with clinical judgement   ADL                           Toilet Transfer: Min guard;Regular Toilet;Grab bars;RW   Toileting- Water quality scientist and Hygiene: Min guard;Sit to/from stand               Vision   Vision Assessment?: No apparent visual deficits   Perception     Praxis      Cognition Arousal/Alertness: Awake/alert Behavior During Therapy: WFL for tasks assessed/performed Overall Cognitive Status: Within Functional Limits for tasks assessed                                          Exercises     Shoulder Instructions       General Comments      Pertinent Vitals/ Pain       Pain Assessment: No/denies pain  Home Living                                          Prior Functioning/Environment              Frequency  Min 2X/week        Progress Toward Goals  OT Goals(current goals can now be found in  the care plan section)  Progress towards OT goals: Progressing toward goals  Acute Rehab OT Goals Patient Stated Goal: to go home soon OT Goal Formulation: With patient Time For Goal Achievement: 11/01/20 Potential to Achieve Goals: Good  Plan Discharge plan remains appropriate    Co-evaluation          OT goals addressed during session: ADL's and self-care      AM-PAC OT "6 Clicks" Daily Activity     Outcome Measure   Help from another person eating meals?: None Help from another person taking care of personal grooming?: A Little Help from another person toileting, which includes using toliet, bedpan, or urinal?: A Little Help from another person bathing (including washing, rinsing, drying)?: A Little Help from another person to put on and taking off regular upper body clothing?: A Little Help from another person to put on and taking off regular lower body clothing?:  A Little 6 Click Score: 19    End of Session Equipment Utilized During Treatment: Rolling walker  OT Visit Diagnosis: Unsteadiness on feet (R26.81);Pain   Activity Tolerance Patient limited by fatigue;Patient tolerated treatment well   Patient Left with call bell/phone within reach;with nursing/sitter in room;in chair;with chair alarm set   Nurse Communication  (okay to see per RN)        Time: 5041-3643 OT Time Calculation (min): 16 min  Charges: OT General Charges $OT Visit: 1 Visit OT Treatments $Self Care/Home Management : 8-22 mins  Derl Barrow, OTR/L [place]  Office [phone] Pager: [place] 10/25/2020, 3:49 PM

## 2020-10-25 NOTE — Progress Notes (Signed)
PT Cancellation Note  Patient Details Name: [name] MRN: [id number] DOB: 04-Jun-1956   Cancelled Treatment:    Reason Eval/Treat Not Completed: Other (comment) Pt with RN and MD, pt vomiting.  Will check back as schedule permits.   Bria Portales,KATHrine E 10/25/2020, 12:38 PM Jannette Spanner PT, DPT Acute Rehabilitation Services Pager: [phone] Office: [phone]

## 2020-10-26 DIAGNOSIS — M545 Low back pain, unspecified: Secondary | ICD-10-CM | POA: Diagnosis not present

## 2020-10-26 DIAGNOSIS — A419 Sepsis, unspecified organism: Secondary | ICD-10-CM | POA: Diagnosis not present

## 2020-10-26 DIAGNOSIS — K5792 Diverticulitis of intestine, part unspecified, without perforation or abscess without bleeding: Secondary | ICD-10-CM | POA: Diagnosis not present

## 2020-10-26 DIAGNOSIS — I420 Dilated cardiomyopathy: Secondary | ICD-10-CM | POA: Diagnosis not present

## 2020-10-26 LAB — RENAL FUNCTION PANEL
Albumin: 2.3 g/dL — ABNORMAL LOW (ref 3.5–5.0)
Anion gap: 7 (ref 5–15)
BUN: 6 mg/dL — ABNORMAL LOW (ref 8–23)
CO2: 19 mmol/L — ABNORMAL LOW (ref 22–32)
Calcium: 7.4 mg/dL — ABNORMAL LOW (ref 8.9–10.3)
Chloride: 112 mmol/L — ABNORMAL HIGH (ref 98–111)
Creatinine, Ser: 0.68 mg/dL (ref 0.44–1.00)
GFR, Estimated: >60 mL/min (ref >60)
Glucose, Bld: 79 mg/dL (ref 70–99)
Phosphorus: 1.4 mg/dL — ABNORMAL LOW (ref 2.5–4.6)
Potassium: 3.6 mmol/L (ref 3.5–5.1)
Sodium: 138 mmol/L (ref 135–145)

## 2020-10-26 LAB — CBC WITH DIFFERENTIAL/PLATELET
Abs Immature Granulocytes: 0.08 10*3/uL — ABNORMAL HIGH (ref 0.00–0.07)
Basophils Absolute: 0.1 10*3/uL (ref 0.0–0.1)
Basophils Relative: 1 %
Eosinophils Absolute: 0.1 10*3/uL (ref 0.0–0.5)
Eosinophils Relative: 1 %
HCT: 28.1 % — ABNORMAL LOW (ref 36.0–46.0)
Hemoglobin: 8.9 g/dL — ABNORMAL LOW (ref 12.0–15.0)
Immature Granulocytes: 1 %
Lymphocytes Relative: 35 %
Lymphs Abs: 3.7 10*3/uL (ref 0.7–4.0)
MCH: 30.3 pg (ref 26.0–34.0)
MCHC: 31.7 g/dL (ref 30.0–36.0)
MCV: 95.6 fL (ref 80.0–100.0)
Monocytes Absolute: 1.1 10*3/uL — ABNORMAL HIGH (ref 0.1–1.0)
Monocytes Relative: 11 %
Neutro Abs: 5.6 10*3/uL (ref 1.7–7.7)
Neutrophils Relative %: 51 %
Platelets: 440 10*3/uL — ABNORMAL HIGH (ref 150–400)
RBC: 2.94 MIL/uL — ABNORMAL LOW (ref 3.87–5.11)
RDW: 13.9 % (ref 11.5–15.5)
WBC: 10.7 10*3/uL — ABNORMAL HIGH (ref 4.0–10.5)
nRBC: 0 % (ref 0.0–0.2)

## 2020-10-26 LAB — MAGNESIUM: Magnesium: 1.9 mg/dL (ref 1.7–2.4)

## 2020-10-26 LAB — GLUCOSE, CAPILLARY: Glucose-Capillary: 80 mg/dL (ref 70–99)

## 2020-10-26 MED ORDER — ALBUMIN HUMAN 25 % IV SOLN
25.0000 g | Freq: Once | INTRAVENOUS | Status: AC
  Administered 2020-10-26: 25 g via INTRAVENOUS
  Filled 2020-10-26: qty 100

## 2020-10-26 MED ORDER — METOPROLOL TARTRATE 5 MG/5ML IV SOLN
5.0000 mg | Freq: Three times a day (TID) | INTRAVENOUS | Status: DC
  Administered 2020-10-26 – 2020-10-28 (×7): 5 mg via INTRAVENOUS
  Filled 2020-10-26 (×9): qty 5

## 2020-10-26 MED ORDER — FUROSEMIDE 10 MG/ML IJ SOLN
40.0000 mg | Freq: Once | INTRAMUSCULAR | Status: AC
  Administered 2020-10-26: 40 mg via INTRAVENOUS
  Filled 2020-10-26: qty 4

## 2020-10-26 MED ORDER — POTASSIUM PHOSPHATES 15 MMOLE/5ML IV SOLN
30.0000 mmol | Freq: Once | INTRAVENOUS | Status: AC
  Administered 2020-10-26: 30 mmol via INTRAVENOUS
  Filled 2020-10-26: qty 10

## 2020-10-26 NOTE — Progress Notes (Addendum)
PROGRESS NOTE    [name]  BDZ:[id number] DOB: 04/08/1956 DOA: 10/17/2020 PCP: Lillard Anes, MD    Chief Complaint  Patient presents with  . Abdominal Pain    Brief Narrative:  [name] a 65 y.o.femalewith medical history significant ofarthritis, cardiomyopathy, chronic back pain, COPD, chronic narcotic use, chronic knee pains, chronic systolic congestive heart failure,dual ICD, hyperlipidemia, hypertension, GERD, major depressive disorder, PTSD, recurrent incisional hernia repair status post flap repair 03/15/2015 who presented with abdominal pain x3 weeks intermittent,associated with intermittent nausea but no vomiting. She was seen by PCP in [place] 10/16/20 and return for complete abdominal CT reading "Abnormal posterior, proximal sigmoid multiple colocolonic fistula and adhesion adjacent to a loop, wall thickening, consistent with acute diverticulitis or other infectious or inflammatory colitis."  She was started on empiric IV antibiotics and admitted to the hospital.  She remained hypotensive and was transferred to ICU for pressors.  PCCM, general surgery, GI consulted.  Patient continued to have clinical improvement.  Weaned off pressors.  She was resumed on her home Flor[place].  Patient with significant nausea vomiting over the past 48 hours.  Noted to initially start after physical therapy with associated dizziness, nausea and vomiting with abdominal pain which she believes is secondary to emesis.   Assessment & Plan:   Principal Problem:   Sepsis ([place]) Active Problems:   COPD (chronic obstructive pulmonary disease) (HCC)   Cardiomyopathy (HCC)   Chronic back pain   GERD (gastroesophageal reflux disease)   Chronic systolic congestive heart failure, NYHA class 2 (HCC)   Dilated cardiomyopathy (HCC)   ICD (implantable cardioverter-defibrillator) in place   Dyslipidemia   Essential hypertension   Hypotension   Abdominal pain   Acute diverticulitis    Colonic fistula   Nausea & vomiting  1 septic shock secondary to acute diverticulitis as well as colocolonic fistula Patient had presented with criteria for sepsis with endorgan damage including AKI, hypotension requiring pressors. -Currently off pressors, PCCM was following but have signed off. -Patient assessed by GI did not feel patient's presentation typical of Crohn's/IBD.  Likely to be complication from underlying progressive and severe diverticular disease.  Inflammatory markers ordered.  GI following peripherally. -Patient seen in consultation by general surgery and no surgical intervention needed at this time. -CRP of 115.26, sed rate 52. -CRP and sed rate trended down. -Blood cultures done 10/17/2020 with single site showing staph epidermidis likely contaminant.  Patient did receive a dose of IV vancomycin as patient had presented in shock. -Repeat blood cultures 10/18/2020 was negative.  Vancomycin discontinued. -Was on IV cefepime, Flagyl and deescalated to Augmentin. -Was on IV Solu-Cortef and weaned back to home regimen of Florinef. -WBC was elevated but slowly trending down. -Patient with ongoing nausea and emesis for the past 24 to 48 hours with inability to tolerate oral intake including medications per RN on 10/25/2020.. -Abdominal films done 10/23/2020 negative for obstruction. -CT abdomen and pelvis 10/25/2020), with no acute findings within the abdomen or pelvis.  No bowel obstruction.  Colonic diverticulosis without evidence of acute diverticulitis.  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  Bowel wall thickening appreciated in this area on earlier CT has resolved.  No evidence of acute enteritis, colitis or diverticulitis.  Trace free fluid in the right lower pelvis.  No abscess collection is seen.  No free intraperitoneal  air.  Anasarca. -Clinical  improvement.  Tolerating clears. -Compazine 10 mg IV every 6 hours as needed nausea and vomiting if uncontrolled by Zofran. -Discontinued Phenergan -May use IV Ativan as needed for intractable nausea vomiting not controlled by Compazine or Zofran. -Discontinued Augmentin and placed on IV Zosyn for another 1 to 2 days and could likely subsequently discontinue antibiotics.  -.  Gentle hydration with bicarb drip.  Follow.   2.  Metabolic acidosis Likely secondary to intractable nausea and vomiting.  Improved on bicarb drip.  IV Compazine as needed.  IV Zofran as needed.  3.  Intractable nausea vomiting Patient with intractable nausea and vomiting for the past 48 hours.  -  Patient with improvement with nausea and emesis today.   -CT abdomen and pelvis done (10/25/2020), with no acute findings within the abdomen or pelvis.  No bowel obstruction.  Colonic diverticulosis without evidence of acute diverticulitis.  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  Bowel wall thickening appreciated in this area on earlier CT has resolved.  No evidence of acute enteritis, colitis or diverticulitis.  Trace free fluid in the right lower pelvis.  No abscess collection is seen.  No free intraperitoneal air.  Anasarca. -Continue IV Compazine as needed.   -IV PPI.   -Gentle hydration for the next 24 hours.   -Continue IV Zosyn and discontinue antibiotics in the next 1 to 2 days.   -Supportive care.   4.  Chronic systolic CHF/cardiomyopathy with biventricular ICD -2D echo with EF of 35 to 40% (12/15/2019).- -Currently compensated and likely on the dry side due to ongoing nausea and vomiting. -Gentle hydration for another 24 hours. -Continue Entresto.  Ranexa and Lasix (prn) on hold. -Continue IV Lopressor and when oral intake improves  with improvement with nausea and vomiting transition back to home regimen Coreg.  -Monitor volume status with gentle hydration.  -CT abdomen and pelvis with some concern for anasarca.  Patient with borderline blood pressure.  Patient with albumin level of 2.4.  We will give albumin 25 g x 1 followed by Lasix 40 mg IV x1.  Follow.  5.  Acute kidney injury Resolved.  6.  Anxiety Continue to hold Topamax due to ongoing acidosis.  Continue Zoloft as needed.  Continue IV Ativan as needed and when oral intake improves resume back to home regimen oral Valium.  7.  Chronic pain Continue current pain regimen with Neurontin and oxycodone.  8.  Hyperlipidemia Statin.  9.  Lightheadedness/dizziness Likely secondary to volume depletion, orthostasis from ongoing nausea and vomiting x48 hours.  Initial concern was for vertigo.  Clinical improvement.  Gentle hydration for another 24 hours.  Meclizine as needed.   -   DVT prophylaxis: Heparin Code Status: Full Family Communication: Updated patient.  No family at bedside. Disposition:   Status is: Inpatient    Dispo: The patient is from: Home              Anticipated d/c is to: Likely SNF              Patient currently with improvement with nausea and vomiting, on clears.  On IV antibiotics.  Not stable for discharge.   Difficult to place patient undetermined       Consultants:   PCCM: Dr. Elsworth Soho 10/17/2020  General surgery: Dr.Gerkin 10/17/2020  Gastroenterology: Dr. Therisa Doyne 10/18/2020  Procedures:   Abdominal films 10/23/2020  CT abdomen  and pelvis 10/25/2020  Antimicrobials:   Augmentin 10/23/2020 >>>> 10/25/2020  IV cefepime 10/17/2020>>>> 10/23/2020  IV Cipro 2/22/ 2022x1  IV Flagyl 10/17/2020>>>> 10/23/2020  IV Zosyn 10/25/2020>>>>>  IV vancomycin 10/18/2020>>>> 10/19/2020   Subjective: Patient laying in bed.  Denies any emesis today.  Still with some nausea.  Feels emesis and nausea have improved.  Tolerating clears.  Resistant  to advancement of diet at this time.  No chest pain.  No shortness of breath.  States abdominal pain has improved.  Patient states lightheadedness and dizziness improved.  Objective: Vitals:   10/25/20 2115 10/26/20 0525 10/26/20 0801 10/26/20 0859  BP: (!) 105/93 101/60    Pulse: 98 81    Resp: 20 20    Temp:  98 F (36.7 C)    TempSrc:  Oral    SpO2: 100% 99% 100%   Weight:    64.8 kg  Height:        Intake/Output Summary (Last 24 hours) at 10/26/2020 1222 Last data filed at 10/26/2020 0916 Gross per 24 hour  Intake 496.68 ml  Output 1450 ml  Net -953.32 ml   Filed Weights   10/22/20 0500 10/23/20 0500 10/26/20 0859  Weight: 64.6 kg 65.5 kg 64.8 kg    Examination:  General exam: NAD Respiratory system: CTA B.  No wheezes, no crackles, no rhonchi.  Normal respiratory effort.   Cardiovascular system: Regular rate rhythm no murmurs rubs or gallops.  No JVD.  No lower extremity edema.  Gastrointestinal system: Abdomen is nondistended, soft and decreased tenderness to palpation left lower quadrant lower abdominal region.  Positive bowel sounds.  No rebound.  No guarding.   Central nervous system: Alert and oriented. No focal neurological deficits. Extremities: Symmetric 5 x 5 power. Skin: No rashes, lesions or ulcers Psychiatry: Judgement and insight appear normal. Mood & affect appropriate.     Data Reviewed: I have personally reviewed following labs and imaging studies  CBC: Recent Labs  Lab 10/22/20 0523 10/23/20 0446 10/24/20 0451 10/25/20 0440 10/26/20 0501  WBC 15.0* 13.7* 13.3* 11.9* 10.7*  NEUTROABS  --   --   --   --  5.6  HGB 9.7* 9.6* 9.9* 9.9* 8.9*  HCT 30.0* 29.3* 30.6* 31.0* 28.1*  MCV 92.0 93.3 93.3 93.9 95.6  PLT 452* 426* 474* 477* 440*    Basic Metabolic Panel: Recent Labs  Lab 10/20/20 0446 10/21/20 0415 10/22/20 0523 10/24/20 0451 10/25/20 0440 10/26/20 0501  NA 140 141 141 142 137 138  K 3.4* 3.9 4.4 3.6 3.5 3.6  CL 114* 114* 114*  116* 113* 112*  CO2 20* 21* 22 20* 16* 19*  GLUCOSE 121* 89 89 86 84 79  BUN 14 14 14 9 8  6*  CREATININE 0.78 0.69 0.69 0.65 0.61 0.68  CALCIUM 7.4* 7.5* 8.1* 8.0* 7.6* 7.4*  MG 3.0*  --   --   --  2.0 1.9  PHOS  --   --   --   --   --  1.4*    GFR: Estimated Creatinine Clearance: 58.1 mL/min (by C-G formula based on SCr of 0.68 mg/dL).  Liver Function Tests: Recent Labs  Lab 10/26/20 0501  ALBUMIN 2.3*    CBG: Recent Labs  Lab 10/22/20 0731 10/23/20 0739 10/24/20 0748 10/25/20 0739 10/26/20 0853  GLUCAP 91 77 90 78 80     Recent Results (from the past 240 hour(s))  Culture, blood (routine x 2)     Status: Abnormal   Collection  Time: 10/17/20  1:15 PM   Specimen: BLOOD RIGHT FOREARM  Result Value Ref Range Status   Specimen Description   Final    BLOOD RIGHT FOREARM Performed at [facility] [address] [place], [place] [address]    Special Requests   Final    BOTTLES DRAWN AEROBIC AND ANAEROBIC Blood Culture adequate volume Performed at [facility] [address] [place], [place] [address]    Culture  Setup Time   Final    GRAM POSITIVE COCCI IN CLUSTERS ANAEROBIC BOTTLE ONLY Organism ID to follow CRITICAL RESULT CALLED TO, READ BACK BY AND VERIFIED WITH: Sheffield Slider PHARMD [id number] 10/18/20 A BROWNING    Culture (A)  Final    STAPHYLOCOCCUS EPIDERMIDIS THE SIGNIFICANCE OF ISOLATING THIS ORGANISM FROM A SINGLE SET OF BLOOD CULTURES WHEN MULTIPLE SETS ARE DRAWN IS UNCERTAIN. PLEASE NOTIFY THE MICROBIOLOGY DEPARTMENT WITHIN ONE WEEK IF SPECIATION AND SENSITIVITIES ARE REQUIRED. Performed at [facility], [place] [address], [place], [place] [address]    Report Status 10/20/2020 FINAL  Final  Blood Culture ID Panel (Reflexed)     Status: Abnormal   Collection Time: 10/17/20  1:15 PM  Result Value Ref Range Status   Enterococcus faecalis NOT DETECTED NOT DETECTED Final   Enterococcus Faecium NOT DETECTED NOT DETECTED  Final   Listeria monocytogenes NOT DETECTED NOT DETECTED Final   Staphylococcus species DETECTED (A) NOT DETECTED Final    Comment: CRITICAL RESULT CALLED TO, READ BACK BY AND VERIFIED WITH: Sheffield Slider PHARMD [id number] 10/18/20 A BROWNING    Staphylococcus aureus (BCID) NOT DETECTED NOT DETECTED Final   Staphylococcus epidermidis DETECTED (A) NOT DETECTED Final    Comment: Methicillin (oxacillin) resistant coagulase negative staphylococcus. Possible blood culture contaminant (unless isolated from more than one blood culture draw or clinical case suggests pathogenicity). No antibiotic treatment is indicated for blood  culture contaminants. CRITICAL RESULT CALLED TO, READ BACK BY AND VERIFIED WITH: Sheffield Slider PHARMD [id number] 10/18/20 A BROWNING    Staphylococcus lugdunensis NOT DETECTED NOT DETECTED Final   Streptococcus species NOT DETECTED NOT DETECTED Final   Streptococcus agalactiae NOT DETECTED NOT DETECTED Final   Streptococcus pneumoniae NOT DETECTED NOT DETECTED Final   Streptococcus pyogenes NOT DETECTED NOT DETECTED Final   A.calcoaceticus-baumannii NOT DETECTED NOT DETECTED Final   Bacteroides fragilis NOT DETECTED NOT DETECTED Final   Enterobacterales NOT DETECTED NOT DETECTED Final   Enterobacter cloacae complex NOT DETECTED NOT DETECTED Final   Escherichia coli NOT DETECTED NOT DETECTED Final   Klebsiella aerogenes NOT DETECTED NOT DETECTED Final   Klebsiella oxytoca NOT DETECTED NOT DETECTED Final   Klebsiella pneumoniae NOT DETECTED NOT DETECTED Final   Proteus species NOT DETECTED NOT DETECTED Final   Salmonella species NOT DETECTED NOT DETECTED Final   Serratia marcescens NOT DETECTED NOT DETECTED Final   Haemophilus influenzae NOT DETECTED NOT DETECTED Final   Neisseria meningitidis NOT DETECTED NOT DETECTED Final   Pseudomonas aeruginosa NOT DETECTED NOT DETECTED Final   Stenotrophomonas maltophilia NOT DETECTED NOT DETECTED Final   Candida albicans NOT DETECTED NOT DETECTED  Final   Candida auris NOT DETECTED NOT DETECTED Final   Candida glabrata NOT DETECTED NOT DETECTED Final   Candida krusei NOT DETECTED NOT DETECTED Final   Candida parapsilosis NOT DETECTED NOT DETECTED Final   Candida tropicalis NOT DETECTED NOT DETECTED Final   Cryptococcus neoformans/gattii NOT DETECTED NOT DETECTED Final   Methicillin resistance mecA/C DETECTED (A) NOT DETECTED Final  Comment: CRITICAL RESULT CALLED TO, READ BACK BY AND VERIFIED WITH: Sheffield Slider [facility] [id number] 10/18/20 A BROWNING Performed at [facility], [place] [address] [place], [place] [address]   Culture, blood (routine x 2)     Status: None   Collection Time: 10/17/20  1:22 PM   Specimen: BLOOD  Result Value Ref Range Status   Specimen Description   Final    BLOOD SITE NOT SPECIFIED Performed at [facility], [address] [place], [place] [address]    Special Requests   Final    BOTTLES DRAWN AEROBIC AND ANAEROBIC Blood Culture results may not be optimal due to an inadequate volume of blood received in culture bottles Performed at [facility] [address] [place], [place] [address]    Culture   Final    NO GROWTH 5 DAYS Performed at [facility], [place] [address] [place], [place] [address]    Report Status 10/22/2020 FINAL  Final  SARS CORONAVIRUS 2 (TAT 6-24 HRS) Nasopharyngeal Nasopharyngeal Swab     Status: None   Collection Time: 10/17/20  5:24 PM   Specimen: Nasopharyngeal Swab  Result Value Ref Range Status   SARS Coronavirus 2 NEGATIVE NEGATIVE Final    Comment: (NOTE) SARS-CoV-2 target nucleic acids are NOT DETECTED.  The SARS-CoV-2 RNA is generally detectable in upper and lower respiratory specimens during the acute phase of infection. Negative results do not preclude SARS-CoV-2 infection, do not rule out co-infections with other pathogens, and should not be used as the sole basis for treatment or other patient management decisions. Negative  results must be combined with clinical observations, patient history, and epidemiological information. The expected result is Negative.  Fact Sheet for Patients: SugarRoll.be  Fact Sheet for Healthcare Providers: https://www.woods-mathews.com/  This test is not yet approved or cleared by the [place] FDA and  has been authorized for detection and/or diagnosis of SARS-CoV-2 by FDA under an Emergency Use Authorization (EUA). This EUA will remain  in effect (meaning this test can be used) for the duration of the COVID-19 declaration under Se ction 564(b)(1) of the Act, 21 U.S.C. section 360bbb-3(b)(1), unless the authorization is terminated or revoked sooner.  Performed at [facility], [place] [address], [place], [place] [address]   MRSA PCR Screening     Status: None   Collection Time: 10/17/20 10:46 PM   Specimen: Nasopharyngeal  Result Value Ref Range Status   MRSA by PCR NEGATIVE NEGATIVE Final    Comment:        The GeneXpert MRSA Assay (FDA approved for NASAL specimens only), is one component of a comprehensive MRSA colonization surveillance program. It is not intended to diagnose MRSA infection nor to guide or monitor treatment for MRSA infections. Performed at [facility], [place] [address], [place], [place] [address]   Culture, blood (Routine X 2) w Reflex to ID Panel     Status: None   Collection Time: 10/18/20  2:14 PM   Specimen: BLOOD RIGHT HAND  Result Value Ref Range Status   Specimen Description   Final    BLOOD RIGHT HAND Performed at [facility] [address] [place], [place] [address]    Special Requests   Final    BOTTLES DRAWN AEROBIC ONLY Blood Culture adequate volume Performed at [facility] [address] [place], [place] [address]    Culture   Final    NO GROWTH  5 DAYS Performed at [facility], [place] [address] [place],  [place] [address]    Report Status 10/23/2020 FINAL  Final  Culture, blood (Routine X 2) w Reflex to ID Panel     Status: None   Collection Time: 10/18/20  2:14 PM   Specimen: BLOOD LEFT HAND  Result Value Ref Range Status   Specimen Description   Final    BLOOD LEFT HAND Performed at [facility] [address], [place], [place] [address]    Special Requests   Final    BOTTLES DRAWN AEROBIC ONLY Blood Culture adequate volume Performed at [facility] [address], [place], [place] [address]    Culture   Final    NO GROWTH 5 DAYS Performed at [facility], [place] [address] [place], [place] [address]    Report Status 10/23/2020 FINAL  Final         Radiology Studies: CT ABDOMEN PELVIS W CONTRAST  Result Date: 10/25/2020 CLINICAL DATA:  Nausea, vomiting, abdominal pain. EXAM: CT ABDOMEN AND PELVIS WITH CONTRAST TECHNIQUE: Multidetector CT imaging of the abdomen and pelvis was performed using the standard protocol following bolus administration of intravenous contrast. CONTRAST:  141mL OMNIPAQUE IOHEXOL 300 MG/ML  SOLN COMPARISON:  CT abdomen dated 10/16/2020 FINDINGS: Lower chest: Mild bibasilar atelectasis. Hepatobiliary: Status post cholecystectomy. No focal liver abnormality is seen. No significant bile duct dilatation. Pancreas: Unremarkable. No pancreatic ductal dilatation or surrounding inflammatory changes. Spleen: Normal in size without focal abnormality. Adrenals/Urinary Tract: Adrenal glands appear normal. Small bilateral renal cysts. No suspicious mass, stone or hydronephrosis. Chronic perinephric fluid stranding bilaterally. No ureteral or bladder calculi are identified. Bladder is unremarkable, partially decompressed. Stomach/Bowel: No dilated large or small bowel loops. Scattered diverticulosis throughout the colon, most confluent within the descending and sigmoid colon, but no focal inflammatory changes to suggest acute diverticulitis.  There is redemonstration of a coloenteric fistula, and configuration again suggests additional colo colonic fistula, with associated adhesions. However, the bowel wall thickening appreciated in this area on earlier CT of 10/16/2020 has resolved. On today's study, there is no evidence of acute enteritis or colitis. Vascular/Lymphatic: Aortic atherosclerosis. No acute appearing vascular abnormality. No enlarged lymph nodes are seen. Reproductive: No adnexal mass. Other: Trace free fluid in the RIGHT lower pelvis. No abscess collection is seen. No free intraperitoneal air. Musculoskeletal: No acute appearing osseous abnormality. Degenerative spondylosis of the thoracolumbar spine, mild to moderate in degree. Scattered fluid stranding within the subcutaneous soft tissues indicating anasarca. IMPRESSION: 1. No acute findings within the abdomen or pelvis. No bowel obstruction. 2. Colonic diverticulosis without evidence of acute diverticulitis. 3. 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 However, the bowel wall thickening appreciated in this area on earlier CT of 10/16/2020 has resolved. On today's study, there is no evidence of acute enteritis, colitis or diverticulitis. 4. Trace free fluid in the RIGHT lower pelvis. No abscess collection is seen. No free intraperitoneal air. 5. Anasarca. Aortic Atherosclerosis (ICD10-I70.0). Electronically Signed   By: Franki Cabot M.D.   On: 10/25/2020 18:42        Scheduled Meds: . celecoxib  200 mg Oral q morning  . feeding supplement  1 Container Oral BID BM  . feeding supplement  237 mL Oral BID BM  . fludrocortisone  0.1 mg Oral Daily  .  fluticasone furoate-vilanterol  1 puff Inhalation Q1500  . heparin  5,000 Units Subcutaneous Q8H  . hydrocortisone sod succinate (SOLU-CORTEF) inj  50 mg  Intravenous Daily  . mouth rinse  15 mL Mouth Rinse BID  . metoprolol tartrate  2.5 mg Intravenous Q8H  . multivitamin with minerals  1 tablet Oral Daily  . pantoprazole (PROTONIX) IV  40 mg Intravenous Q24H  . potassium chloride  40 mEq Oral Once  . rosuvastatin  20 mg Oral q morning  . sacubitril-valsartan  1 tablet Oral BID  . sertraline  50 mg Oral Daily  . sodium chloride flush  3 mL Intravenous Q12H  . sodium chloride flush  3 mL Intravenous Q12H   Continuous Infusions: . sodium chloride    . sodium chloride Stopped (10/17/20 2313)  . piperacillin-tazobactam (ZOSYN)  IV 3.375 g (10/26/20 0531)  . potassium PHOSPHATE IVPB (in mmol) 30 mmol (10/26/20 1158)  . sodium bicarbonate (isotonic) 150 mEq in D5W 1000 mL infusion 50 mL/hr at 10/26/20 0956     LOS: 9 days    Time spent: 40 minutes    Irine Seal, MD [facility]   To contact the attending provider between 7A-7P or the covering provider during after hours 7P-7A, please log into the web site [email or web address] and access using universal [place] password for that web site. If you do not have the password, please call the hospital operator.  10/26/2020, 12:22 PM

## 2020-10-26 NOTE — Progress Notes (Addendum)
Physical Therapy Treatment Patient Details Name: [name] MRN: [id number] DOB: 10/01/1955 Today's Date: 10/26/2020    History of Present Illness 65 y.o. female with PMH of arthritis, cardiomyopathy, chronic back pain, COPD, chronic narcotic use, chronic knee pains, chronic systolic congestive heart failure, dual ICD, hyperlipidemia, hypertension, GERD, major depressive disorder, PTSD, recurrent incisional hernia repair status post flap repair 03/15/2015 . Presenting with abdominal pain x3 weeks. 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    PT Comments    Pt tolerated increased ambulation distance of 160' with RW, HR 140 max while ambulating, no dyspnea, pt reports very mild dizziness while walking. HR 90 at rest prior to activity. Encouraged pt to ambulate to bathroom with assistance rather than using external catheter in order to increase activity tolerance.    Follow Up Recommendations  Home health PT;Supervision for mobility/OOB     Equipment Recommendations  None recommended by PT    Recommendations for Other Services       Precautions / Restrictions Precautions Precautions: Fall Precaution Comments: monitor for orthostatic/dizzy Restrictions Weight Bearing Restrictions: No    Mobility  Bed Mobility Overal bed mobility: Modified Independent         Sit to supine: Modified independent (Device/Increase time)   General bed mobility comments: used rail    Transfers Overall transfer level: Needs assistance Equipment used: Rolling walker (2 wheeled) Transfers: Sit to/from Stand Sit to Stand: Min guard;Supervision         General transfer comment: cues for hand placement  Ambulation/Gait Ambulation/Gait assistance: Min guard Gait Distance (Feet): 160 Feet Assistive device: Rolling walker (2 wheeled) Gait Pattern/deviations: Step-through pattern;Decreased stride length Gait velocity: decr   General Gait Details:  steady, HR 90 at rest before activity, HR 140 max walking, HR 101 after 3 minutes supine rest following ambulation, pt reports very mild dizziness while walking   Stairs             Wheelchair Mobility    Modified Rankin (Stroke Patients Only)       Balance Overall balance assessment: Needs assistance Sitting-balance support: No upper extremity supported;Feet unsupported Sitting balance-Leahy Scale: Good     Standing balance support: Bilateral upper extremity supported;During functional activity Standing balance-Leahy Scale: Fair                              Cognition Arousal/Alertness: Awake/alert Behavior During Therapy: WFL for tasks assessed/performed Overall Cognitive Status: Within Functional Limits for tasks assessed                                        Exercises      General Comments        Pertinent Vitals/Pain Pain Score: 8  Pain Location: abdomen Pain Descriptors / Indicators: Grimacing;Guarding Pain Intervention(s): Limited activity within patient's tolerance;Monitored during session;Premedicated before session;Repositioned    Home Living                      Prior Function            PT Goals (current goals can now be found in the care plan section) Acute Rehab PT Goals Patient Stated Goal: to go home soon, be with her Cristobal Goldmann PT Goal Formulation: With patient Time For Goal Achievement: 11/01/20 Potential to Achieve Goals: Good Progress towards  PT goals: Goals met and updated - see care plan    Frequency    Min 3X/week      PT Plan Current plan remains appropriate    Co-evaluation              AM-PAC PT "6 Clicks" Mobility   Outcome Measure  Help needed turning from your back to your side while in a flat bed without using bedrails?: A Little Help needed moving from lying on your back to sitting on the side of a flat bed without using bedrails?: A Little Help needed moving to  and from a bed to a chair (including a wheelchair)?: A Little Help needed standing up from a chair using your arms (e.g., wheelchair or bedside chair)?: A Little Help needed to walk in hospital room?: A Little Help needed climbing 3-5 steps with a railing? : A Lot 6 Click Score: 17    End of Session Equipment Utilized During Treatment: Gait belt Activity Tolerance: Patient tolerated treatment well Patient left: with call bell/phone within reach;in bed;with bed alarm set Nurse Communication: Mobility status PT Visit Diagnosis: Unsteadiness on feet (R26.81);Difficulty in walking, not elsewhere classified (R26.2)     Time: 0016-4290 PT Time Calculation (min) (ACUTE ONLY): 21 min  Charges:  $Gait Training: 8-22 mins                    Blondell Reveal Kistler PT 10/26/2020  Acute Rehabilitation Services Pager [phone] Office [phone]

## 2020-10-26 NOTE — Care Management Important Message (Signed)
Important Message  Patient Details IM Letter given to the Patient. Name: [name] MRN: [id number] Date of Birth: 1956/03/12   Medicare Important Message Given:  Yes     Kerin Salen 10/26/2020, 8:27 AM

## 2020-10-27 DIAGNOSIS — M545 Low back pain, unspecified: Secondary | ICD-10-CM | POA: Diagnosis not present

## 2020-10-27 DIAGNOSIS — I420 Dilated cardiomyopathy: Secondary | ICD-10-CM | POA: Diagnosis not present

## 2020-10-27 DIAGNOSIS — K5792 Diverticulitis of intestine, part unspecified, without perforation or abscess without bleeding: Secondary | ICD-10-CM | POA: Diagnosis not present

## 2020-10-27 DIAGNOSIS — A419 Sepsis, unspecified organism: Secondary | ICD-10-CM | POA: Diagnosis not present

## 2020-10-27 LAB — RENAL FUNCTION PANEL
Albumin: 2.7 g/dL — ABNORMAL LOW (ref 3.5–5.0)
Anion gap: 7 (ref 5–15)
BUN: 7 mg/dL — ABNORMAL LOW (ref 8–23)
CO2: 24 mmol/L (ref 22–32)
Calcium: 7.6 mg/dL — ABNORMAL LOW (ref 8.9–10.3)
Chloride: 110 mmol/L (ref 98–111)
Creatinine, Ser: 0.69 mg/dL (ref 0.44–1.00)
GFR, Estimated: >60 mL/min (ref >60)
Glucose, Bld: 92 mg/dL (ref 70–99)
Phosphorus: 2.1 mg/dL — ABNORMAL LOW (ref 2.5–4.6)
Potassium: 3.2 mmol/L — ABNORMAL LOW (ref 3.5–5.1)
Sodium: 141 mmol/L (ref 135–145)

## 2020-10-27 LAB — CBC WITH DIFFERENTIAL/PLATELET
Abs Immature Granulocytes: 0.05 10*3/uL (ref 0.00–0.07)
Basophils Absolute: 0.1 10*3/uL (ref 0.0–0.1)
Basophils Relative: 1 %
Eosinophils Absolute: 0.1 10*3/uL (ref 0.0–0.5)
Eosinophils Relative: 1 %
HCT: 24.9 % — ABNORMAL LOW (ref 36.0–46.0)
Hemoglobin: 8.1 g/dL — ABNORMAL LOW (ref 12.0–15.0)
Immature Granulocytes: 1 %
Lymphocytes Relative: 46 %
Lymphs Abs: 4.5 10*3/uL — ABNORMAL HIGH (ref 0.7–4.0)
MCH: 30.1 pg (ref 26.0–34.0)
MCHC: 32.5 g/dL (ref 30.0–36.0)
MCV: 92.6 fL (ref 80.0–100.0)
Monocytes Absolute: 0.9 10*3/uL (ref 0.1–1.0)
Monocytes Relative: 10 %
Neutro Abs: 3.9 10*3/uL (ref 1.7–7.7)
Neutrophils Relative %: 41 %
Platelets: 403 10*3/uL — ABNORMAL HIGH (ref 150–400)
RBC: 2.69 MIL/uL — ABNORMAL LOW (ref 3.87–5.11)
RDW: 14.2 % (ref 11.5–15.5)
WBC: 9.4 10*3/uL (ref 4.0–10.5)
nRBC: 0 % (ref 0.0–0.2)

## 2020-10-27 LAB — GLUCOSE, CAPILLARY: Glucose-Capillary: 95 mg/dL (ref 70–99)

## 2020-10-27 LAB — MAGNESIUM: Magnesium: 1.8 mg/dL (ref 1.7–2.4)

## 2020-10-27 MED ORDER — MAGNESIUM SULFATE 2 GM/50ML IV SOLN
2.0000 g | Freq: Once | INTRAVENOUS | Status: AC
  Administered 2020-10-27: 2 g via INTRAVENOUS
  Filled 2020-10-27: qty 50

## 2020-10-27 MED ORDER — TOPIRAMATE 100 MG PO TABS
100.0000 mg | ORAL_TABLET | Freq: Every day | ORAL | Status: DC
  Administered 2020-10-27 – 2020-10-28 (×2): 100 mg via ORAL
  Filled 2020-10-27 (×2): qty 1

## 2020-10-27 MED ORDER — POTASSIUM CHLORIDE 10 MEQ/100ML IV SOLN
10.0000 meq | INTRAVENOUS | Status: DC
  Administered 2020-10-27 (×3): 10 meq via INTRAVENOUS
  Filled 2020-10-27 (×5): qty 100

## 2020-10-27 MED ORDER — FUROSEMIDE 20 MG PO TABS
20.0000 mg | ORAL_TABLET | Freq: Every day | ORAL | Status: DC
  Administered 2020-10-27 – 2020-10-29 (×3): 20 mg via ORAL
  Filled 2020-10-27 (×3): qty 1

## 2020-10-27 MED ORDER — POTASSIUM PHOSPHATES 15 MMOLE/5ML IV SOLN
30.0000 mmol | Freq: Once | INTRAVENOUS | Status: AC
  Administered 2020-10-27: 30 mmol via INTRAVENOUS
  Filled 2020-10-27: qty 10

## 2020-10-27 NOTE — Progress Notes (Signed)
Physical Therapy Treatment Patient Details Name: [name] MRN: [id number] DOB: Dec 08, 1955 Today's Date: 10/27/2020    History of Present Illness 65 y.o. female with PMH of arthritis, cardiomyopathy, chronic back pain, COPD, chronic narcotic use, chronic knee pains, chronic systolic congestive heart failure, dual ICD, hyperlipidemia, hypertension, GERD, major depressive disorder, PTSD, recurrent incisional hernia repair status post flap repair 03/15/2015 . Presenting with abdominal pain x3 weeks. 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    PT Comments    Pt assisted with ambulating in hallway and reports emesis earlier (she believes from IV potassium) however did not have dizziness with mobility today.  Pt anticipates d/c home soon.  Follow Up Recommendations  Home health PT;Supervision for mobility/OOB     Equipment Recommendations  None recommended by PT    Recommendations for Other Services       Precautions / Restrictions Precautions Precautions: Fall Restrictions Weight Bearing Restrictions: No    Mobility  Bed Mobility Overal bed mobility: Modified Independent                  Transfers Overall transfer level: Needs assistance Equipment used: Rolling walker (2 wheeled) Transfers: Sit to/from Stand Sit to Stand: Min guard;Supervision            Ambulation/Gait Ambulation/Gait assistance: Min Gaffer (Feet): 160 Feet Assistive device: Rolling walker (2 wheeled) Gait Pattern/deviations: Step-through pattern;Decreased stride length Gait velocity: decr   General Gait Details: HR 116 bpm upon returning to room, pt denies dizziness today   Stairs             Wheelchair Mobility    Modified Rankin (Stroke Patients Only)       Balance                                            Cognition Arousal/Alertness: Awake/alert Behavior During Therapy: WFL for  tasks assessed/performed Overall Cognitive Status: Within Functional Limits for tasks assessed                                        Exercises      General Comments        Pertinent Vitals/Pain Pain Assessment: No/denies pain    Home Living                      Prior Function            PT Goals (current goals can now be found in the care plan section) Progress towards PT goals: Progressing toward goals    Frequency    Min 3X/week      PT Plan Current plan remains appropriate    Co-evaluation              AM-PAC PT "6 Clicks" Mobility   Outcome Measure  Help needed turning from your back to your side while in a flat bed without using bedrails?: A Little Help needed moving from lying on your back to sitting on the side of a flat bed without using bedrails?: A Little Help needed moving to and from a bed to a chair (including a wheelchair)?: A Little Help needed standing up from a chair using your arms (e.g., wheelchair or bedside chair)?: A Little  Help needed to walk in hospital room?: A Little Help needed climbing 3-5 steps with a railing? : A Lot 6 Click Score: 17    End of Session Equipment Utilized During Treatment: Gait belt Activity Tolerance: Patient tolerated treatment well Patient left: with call bell/phone within reach;in bed Nurse Communication: Mobility status (bed unable to alarm: reading error message) PT Visit Diagnosis: Difficulty in walking, not elsewhere classified (R26.2)     Time: 8502-7741 PT Time Calculation (min) (ACUTE ONLY): 15 min  Charges:  $Gait Training: 8-22 mins                    Arlyce Dice, DPT Acute Rehabilitation Services Pager: [phone] Office: [place] 10/27/2020, 2:08 PM

## 2020-10-27 NOTE — Progress Notes (Addendum)
PROGRESS NOTE    [name]  KTG:[id number] DOB: [birth year] DOA: 10/17/2020 PCP: Lillard Anes, MD    Chief Complaint  Patient presents with  . Abdominal Pain    Brief Narrative:  [name] a 65 y.o.femalewith medical history significant ofarthritis, cardiomyopathy, chronic back pain, COPD, chronic narcotic use, chronic knee pains, chronic systolic congestive heart failure,dual ICD, hyperlipidemia, hypertension, GERD, major depressive disorder, PTSD, recurrent incisional hernia repair status post flap repair 03/15/2015 who presented with abdominal pain x3 weeks intermittent,associated with intermittent nausea but no vomiting. She was seen by PCP in [place] 10/16/20 and return for complete abdominal CT reading "Abnormal posterior, proximal sigmoid multiple colocolonic fistula and adhesion adjacent to a loop, wall thickening, consistent with acute diverticulitis or other infectious or inflammatory colitis."  She was started on empiric IV antibiotics and admitted to the hospital.  She remained hypotensive and was transferred to ICU for pressors.  PCCM, general surgery, GI consulted.  Patient continued to have clinical improvement.  Weaned off pressors.  She was resumed on her home [place].  Patient with significant nausea vomiting over the past 48 hours.  Noted to initially start after physical therapy with associated dizziness, nausea and vomiting with abdominal pain which she believes is secondary to emesis.   Assessment & Plan:   Principal Problem:   Sepsis ([place]) Active Problems:   COPD (chronic obstructive pulmonary disease) (HCC)   Cardiomyopathy (HCC)   Chronic back pain   GERD (gastroesophageal reflux disease)   Chronic systolic congestive heart failure, NYHA class 2 (HCC)   Dilated cardiomyopathy (HCC)   ICD (implantable cardioverter-defibrillator) in place   Dyslipidemia   Essential hypertension   Hypotension   Abdominal pain   Acute diverticulitis    Colonic fistula   Nausea & vomiting  1 septic shock secondary to acute diverticulitis as well as colocolonic fistula Patient had presented with criteria for sepsis with endorgan damage including AKI, hypotension requiring pressors. -Currently off pressors, PCCM was following but have signed off. -Patient assessed by GI did not feel patient's presentation typical of Crohn's/IBD.  Likely to be complication from underlying progressive and severe diverticular disease.  Inflammatory markers ordered.  GI following peripherally. -Patient seen in consultation by general surgery and no surgical intervention needed at this time. -CRP of 115.26, sed rate 52. -CRP and sed rate trended down. -Blood cultures done 10/17/2020 with single site showing staph epidermidis likely contaminant.  Patient did receive a dose of IV vancomycin as patient had presented in shock. -Repeat blood cultures 10/18/2020 was negative.  Vancomycin discontinued. -Was on IV cefepime, Flagyl and deescalated to Augmentin. -Was on IV Solu-Cortef and weaned back to home regimen of Florinef. -WBC was elevated but slowly trending down. -Patient with ongoing nausea and emesis for the past 24 to 48 hours with inability to tolerate oral intake including medications per RN on 10/25/2020.. -Abdominal films done 10/23/2020 negative for obstruction. -CT abdomen and pelvis 10/25/2020), with no acute findings within the abdomen or pelvis.  No bowel obstruction.  Colonic diverticulosis without evidence of acute diverticulitis.  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  Bowel wall thickening appreciated in this area on earlier CT has resolved.  No evidence of acute enteritis, colitis or diverticulitis.  Trace free fluid in the right lower pelvis.  No abscess collection is seen.  No free intraperitoneal  air.  Anasarca. -Clinical  improvement.  Tolerating clears. -Compazine 10 mg IV every 6 hours as needed nausea and vomiting if uncontrolled by Zofran. -Discontinued Phenergan -May use IV Ativan as needed for intractable nausea vomiting not controlled by Compazine or Zofran. -Discontinued Augmentin and placed on IV Zosyn and discontinue IV Zosyn after today's doses are given.   -Saline lock IV fluids.   -Follow.   2.  Metabolic acidosis Likely secondary to intractable nausea and vomiting.  Improved on bicarb drip.  Discontinue bicarb drip.  IV Compazine as needed.  IV Zofran as needed.  3.  Intractable nausea vomiting Patient with intractable nausea and vomiting for the past 48 hours.  -  Patient with improvement with nausea and emesis.   -CT abdomen and pelvis done (10/25/2020), with no acute findings within the abdomen or pelvis.  No bowel obstruction.  Colonic diverticulosis without evidence of acute diverticulitis.  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  Bowel wall thickening appreciated in this area on earlier CT has resolved.  No evidence of acute enteritis, colitis or diverticulitis.  Trace free fluid in the right lower pelvis.  No abscess collection is seen.  No free intraperitoneal air.  Anasarca. -Continue IV Compazine as needed.   -IV PPI.   -Discontinue IV fluids.   -Continue IV Zosyn through today and discontinue after today's doses are completed.   -Supportive care.    4.  Chronic systolic CHF/cardiomyopathy with biventricular ICD -2D echo with EF of 35 to 40% (12/15/2019).- -Currently compensated and initially felt to be like on the dry side due to her ongoing nausea and emesis.  -Discontinue gentle hydration bicarb drip. -Continue Entresto.  Continue to hold Ranexa. -Resume home regimen Lasix. -Gentle hydration for  another 24 hours. -Continue IV Lopressor and when oral intake improves with improvement with nausea and vomiting transition back to home regimen Coreg likely tomorrow.  -Monitor volume status with gentle hydration.  -CT abdomen and pelvis with some concern for anasarca.  Patient with borderline blood pressure.  Status post IV albumin x1 with Lasix 40 mg IV x1 with a urine output of 2.150 L in the past 24 hours.  -Resume home dose oral Lasix.  Follow.  5.  Acute kidney injury Resolved.  6.  Anxiety Continue to hold Topamax for another 24 to 48 hours secondary to acidosis.  Zoloft as needed.  IV Ativan as needed and when tolerating oral soft diet could likely resume back on home regimen of oral Valium.  Supportive care.   7.  Chronic pain Continue current pain regimen with Neurontin and oxycodone.  8.  Hyperlipidemia Continue statin.    9.  Lightheadedness/dizziness Likely secondary to volume depletion, orthostasis from ongoing nausea and vomiting x48 hours.  Initial concern was for vertigo.  Improving clinically.  DC IV fluids.  Meclizine as needed.  10.  Hypokalemia/hypophosphatemia K-Phos 30 mmol IV x1.  KCl 10 mEq IV every 1 hour x5 rounds.  Repeat labs in the morning.  -   DVT prophylaxis: Heparin Code Status: Full Family Communication: Updated patient.  No family at bedside. Disposition:   Status is: Inpatient    Dispo: The patient is from: Home              Anticipated d/c is to: Home with home health hopefully in the next 1 to 2 days.  Patient currently with improvement with nausea and vomiting, on full liquid diet.  On IV antibiotics.  Not stable for discharge.    Difficult to place patient undetermined       Consultants:   PCCM: Dr. Elsworth Soho 10/17/2020  General surgery: Dr.Gerkin 10/17/2020  Gastroenterology: Dr. Therisa Doyne 10/18/2020  Procedures:   Abdominal films 10/23/2020  CT abdomen and pelvis 10/25/2020  Antimicrobials:   Augmentin 10/23/2020 >>>>  10/25/2020  IV cefepime 10/17/2020>>>> 10/23/2020  IV Cipro 2/22/ 2022x1  IV Flagyl 10/17/2020>>>> 10/23/2020  IV Zosyn 10/25/2020>>>>> 10/27/2020  IV vancomycin 10/18/2020>>>> 10/19/2020   Subjective: Sitting up in chair.  States nausea and emesis have improved.  Denies chest pain.  Denies any significant shortness of breath.  States dizziness and lightheadedness has improved.  Had significant urine output after IV Lasix yesterday.  Tolerated clear liquids.  Tolerating full liquid diet for breakfast today.   Objective: Vitals:   10/26/20 2011 10/27/20 0554 10/27/20 0600 10/27/20 0730  BP: (!) 93/56 (!) 91/42 98/60   Pulse: 82 60    Resp:  20 15   Temp: 98.8 F (37.1 C) 97.6 F (36.4 C)    TempSrc: Oral Oral    SpO2: 99% 97%  100%  Weight:      Height:        Intake/Output Summary (Last 24 hours) at 10/27/2020 1036 Last data filed at 10/27/2020 0119 Gross per 24 hour  Intake 996.45 ml  Output 1450 ml  Net -453.55 ml   Filed Weights   10/22/20 0500 10/23/20 0500 10/26/20 0859  Weight: 64.6 kg 65.5 kg 64.8 kg    Examination:  General exam: NAD Respiratory system: Some bibasilar crackles.  No wheezing.  No rhonchi.  Normal respiratory effort.  Speaking in full sentences.  Cardiovascular system: RRR no murmurs rubs or gallops.  No JVD.  No lower extremity edema.  Gastrointestinal system: Abdomen soft, nondistended, positive bowel sounds.  No rebound.  No guarding.  Decreased tenderness to palpation in the left lower quadrant.  Central nervous system: Alert and oriented. No focal neurological deficits. Extremities: Symmetric 5 x 5 power. Skin: No rashes, lesions or ulcers Psychiatry: Judgement and insight appear normal. Mood & affect appropriate.     Data Reviewed: I have personally reviewed following labs and imaging studies  CBC: Recent Labs  Lab 10/23/20 0446 10/24/20 0451 10/25/20 0440 10/26/20 0501 10/27/20 0436  WBC 13.7* 13.3* 11.9* 10.7* 9.4  NEUTROABS  --   --    --  5.6 3.9  HGB 9.6* 9.9* 9.9* 8.9* 8.1*  HCT 29.3* 30.6* 31.0* 28.1* 24.9*  MCV 93.3 93.3 93.9 95.6 92.6  PLT 426* 474* 477* 440* 403*    Basic Metabolic Panel: Recent Labs  Lab 10/22/20 0523 10/24/20 0451 10/25/20 0440 10/26/20 0501 10/27/20 0436  NA 141 142 137 138 141  K 4.4 3.6 3.5 3.6 3.2*  CL 114* 116* 113* 112* 110  CO2 22 20* 16* 19* 24  GLUCOSE 89 86 84 79 92  BUN 14 9 8  6* 7*  CREATININE 0.69 0.65 0.61 0.68 0.69  CALCIUM 8.1* 8.0* 7.6* 7.4* 7.6*  MG  --   --  2.0 1.9 1.8  PHOS  --   --   --  1.4* 2.1*    GFR: Estimated Creatinine Clearance: 58.1 mL/min (by C-G formula based on SCr of 0.69 mg/dL).  Liver Function Tests: Recent Labs  Lab 10/26/20 0501 10/27/20 0436  ALBUMIN 2.3* 2.7*    CBG: Recent Labs  Lab  10/23/20 0739 10/24/20 0748 10/25/20 0739 10/26/20 0853 10/27/20 0756  GLUCAP 77 90 78 80 95     Recent Results (from the past 240 hour(s))  Culture, blood (routine x 2)     Status: Abnormal   Collection Time: 10/17/20  1:15 PM   Specimen: BLOOD RIGHT FOREARM  Result Value Ref Range Status   Specimen Description   Final    BLOOD RIGHT FOREARM Performed at [facility] [address] [place], [place] [address]    Special Requests   Final    BOTTLES DRAWN AEROBIC AND ANAEROBIC Blood Culture adequate volume Performed at [facility] [address], [place], [place] [address]    Culture  Setup Time   Final    GRAM POSITIVE COCCI IN CLUSTERS ANAEROBIC BOTTLE ONLY Organism ID to follow CRITICAL RESULT CALLED TO, READ BACK BY AND VERIFIED WITH: Sheffield Slider PHARMD [id number] 10/18/20 A BROWNING    Culture (A)  Final    STAPHYLOCOCCUS EPIDERMIDIS THE SIGNIFICANCE OF ISOLATING THIS ORGANISM FROM A SINGLE SET OF BLOOD CULTURES WHEN MULTIPLE SETS ARE DRAWN IS UNCERTAIN. PLEASE NOTIFY THE MICROBIOLOGY DEPARTMENT WITHIN ONE WEEK IF SPECIATION AND SENSITIVITIES ARE REQUIRED. Performed at [facility],  [place] [address], [place], [place] [address]    Report Status 10/20/2020 FINAL  Final  Blood Culture ID Panel (Reflexed)     Status: Abnormal   Collection Time: 10/17/20  1:15 PM  Result Value Ref Range Status   Enterococcus faecalis NOT DETECTED NOT DETECTED Final   Enterococcus Faecium NOT DETECTED NOT DETECTED Final   Listeria monocytogenes NOT DETECTED NOT DETECTED Final   Staphylococcus species DETECTED (A) NOT DETECTED Final    Comment: CRITICAL RESULT CALLED TO, READ BACK BY AND VERIFIED WITH: Sheffield Slider PHARMD [id number] 10/18/20 A BROWNING    Staphylococcus aureus (BCID) NOT DETECTED NOT DETECTED Final   Staphylococcus epidermidis DETECTED (A) NOT DETECTED Final    Comment: Methicillin (oxacillin) resistant coagulase negative staphylococcus. Possible blood culture contaminant (unless isolated from more than one blood culture draw or clinical case suggests pathogenicity). No antibiotic treatment is indicated for blood  culture contaminants. CRITICAL RESULT CALLED TO, READ BACK BY AND VERIFIED WITH: Sheffield Slider PHARMD [id number] 10/18/20 A BROWNING    Staphylococcus lugdunensis NOT DETECTED NOT DETECTED Final   Streptococcus species NOT DETECTED NOT DETECTED Final   Streptococcus agalactiae NOT DETECTED NOT DETECTED Final   Streptococcus pneumoniae NOT DETECTED NOT DETECTED Final   Streptococcus pyogenes NOT DETECTED NOT DETECTED Final   A.calcoaceticus-baumannii NOT DETECTED NOT DETECTED Final   Bacteroides fragilis NOT DETECTED NOT DETECTED Final   Enterobacterales NOT DETECTED NOT DETECTED Final   Enterobacter cloacae complex NOT DETECTED NOT DETECTED Final   Escherichia coli NOT DETECTED NOT DETECTED Final   Klebsiella aerogenes NOT DETECTED NOT DETECTED Final   Klebsiella oxytoca NOT DETECTED NOT DETECTED Final   Klebsiella pneumoniae NOT DETECTED NOT DETECTED Final   Proteus species NOT DETECTED NOT DETECTED Final   Salmonella species NOT DETECTED NOT DETECTED Final   Serratia marcescens  NOT DETECTED NOT DETECTED Final   Haemophilus influenzae NOT DETECTED NOT DETECTED Final   Neisseria meningitidis NOT DETECTED NOT DETECTED Final   Pseudomonas aeruginosa NOT DETECTED NOT DETECTED Final   Stenotrophomonas maltophilia NOT DETECTED NOT DETECTED Final   Candida albicans NOT DETECTED NOT DETECTED Final   Candida auris NOT DETECTED NOT DETECTED Final   Candida glabrata NOT DETECTED NOT DETECTED Final   Candida krusei  NOT DETECTED NOT DETECTED Final   Candida parapsilosis NOT DETECTED NOT DETECTED Final   Candida tropicalis NOT DETECTED NOT DETECTED Final   Cryptococcus neoformans/gattii NOT DETECTED NOT DETECTED Final   Methicillin resistance mecA/C DETECTED (A) NOT DETECTED Final    Comment: CRITICAL RESULT CALLED TO, READ BACK BY AND VERIFIED WITH: Sheffield Slider [facility] 7654 10/18/20 A BROWNING Performed at [facility], [place] [address] [place], [place] [address]   Culture, blood (routine x 2)     Status: None   Collection Time: 10/17/20  1:22 PM   Specimen: BLOOD  Result Value Ref Range Status   Specimen Description   Final    BLOOD SITE NOT SPECIFIED Performed at [facility], [address], [place], [place] [address]    Special Requests   Final    BOTTLES DRAWN AEROBIC AND ANAEROBIC Blood Culture results may not be optimal due to an inadequate volume of blood received in culture bottles Performed at [facility] [address] [place], [place] [address]    Culture   Final    NO GROWTH 5 DAYS Performed at [facility], [place] [address] [place], [place] [address]    Report Status 10/22/2020 FINAL  Final  SARS CORONAVIRUS 2 (TAT 6-24 HRS) Nasopharyngeal Nasopharyngeal Swab     Status: None   Collection Time: 10/17/20  5:24 PM   Specimen: Nasopharyngeal Swab  Result Value Ref Range Status   SARS Coronavirus 2 NEGATIVE NEGATIVE Final    Comment: (NOTE) SARS-CoV-2 target nucleic acids are NOT DETECTED.  The SARS-CoV-2 RNA is  generally detectable in upper and lower respiratory specimens during the acute phase of infection. Negative results do not preclude SARS-CoV-2 infection, do not rule out co-infections with other pathogens, and should not be used as the sole basis for treatment or other patient management decisions. Negative results must be combined with clinical observations, patient history, and epidemiological information. The expected result is Negative.  Fact Sheet for Patients: SugarRoll.be  Fact Sheet for Healthcare Providers: https://www.woods-mathews.com/  This test is not yet approved or cleared by the [place] FDA and  has been authorized for detection and/or diagnosis of SARS-CoV-2 by FDA under an Emergency Use Authorization (EUA). This EUA will remain  in effect (meaning this test can be used) for the duration of the COVID-19 declaration under Se ction 564(b)(1) of the Act, 21 U.S.C. section 360bbb-3(b)(1), unless the authorization is terminated or revoked sooner.  Performed at [facility], [place] [address] [place], [place] [address]   MRSA PCR Screening     Status: None   Collection Time: 10/17/20 10:46 PM   Specimen: Nasopharyngeal  Result Value Ref Range Status   MRSA by PCR NEGATIVE NEGATIVE Final    Comment:        The GeneXpert MRSA Assay (FDA approved for NASAL specimens only), is one component of a comprehensive MRSA colonization surveillance program. It is not intended to diagnose MRSA infection nor to guide or monitor treatment for MRSA infections. Performed at [facility], [place] [address], [place], [place] [address]   Culture, blood (Routine X 2) w Reflex to ID Panel     Status: None   Collection Time: 10/18/20  2:14 PM   Specimen: BLOOD RIGHT HAND  Result Value Ref Range Status   Specimen Description   Final    BLOOD RIGHT HAND Performed at [facility] [address] [place],  [place] [address]    Special Requests   Final    BOTTLES DRAWN AEROBIC ONLY Blood Culture adequate volume Performed at [facility] [address] [place], [place] [address]    Culture   Final    NO GROWTH 5 DAYS Performed at [facility], [place] [address], [place], [place] [address]    Report Status 10/23/2020 FINAL  Final  Culture, blood (Routine X 2) w Reflex to ID Panel     Status: None   Collection Time: 10/18/20  2:14 PM   Specimen: BLOOD LEFT HAND  Result Value Ref Range Status   Specimen Description   Final    BLOOD LEFT HAND Performed at [facility] [address], [place], [place] [address]    Special Requests   Final    BOTTLES DRAWN AEROBIC ONLY Blood Culture adequate volume Performed at [facility] [address], [place], [place] [address]    Culture   Final    NO GROWTH 5 DAYS Performed at [facility], [place] [address] [place],  [address]    Report Status 10/23/2020 FINAL  Final         Radiology Studies: CT ABDOMEN PELVIS W CONTRAST  Result Date: 10/25/2020 CLINICAL DATA:  Nausea, vomiting, abdominal pain. EXAM: CT ABDOMEN AND PELVIS WITH CONTRAST TECHNIQUE: Multidetector CT imaging of the abdomen and pelvis was performed using the standard protocol following bolus administration of intravenous contrast. CONTRAST:  19mL OMNIPAQUE IOHEXOL 300 MG/ML  SOLN COMPARISON:  CT abdomen dated 10/16/2020 FINDINGS: Lower chest: Mild bibasilar atelectasis. Hepatobiliary: Status post cholecystectomy. No focal liver abnormality is seen. No significant bile duct dilatation. Pancreas: Unremarkable. No pancreatic ductal dilatation or surrounding inflammatory changes. Spleen: Normal in size without focal abnormality. Adrenals/Urinary Tract: Adrenal glands appear normal. Small bilateral renal cysts. No suspicious mass, stone or hydronephrosis. Chronic perinephric fluid stranding bilaterally. No  ureteral or bladder calculi are identified. Bladder is unremarkable, partially decompressed. Stomach/Bowel: No dilated large or small bowel loops. Scattered diverticulosis throughout the colon, most confluent within the descending and sigmoid colon, but no focal inflammatory changes to suggest acute diverticulitis. There is redemonstration of a coloenteric fistula, and configuration again suggests additional colo colonic fistula, with associated adhesions. However, the bowel wall thickening appreciated in this area on earlier CT of 10/16/2020 has resolved. On today's study, there is no evidence of acute enteritis or colitis. Vascular/Lymphatic: Aortic atherosclerosis. No acute appearing vascular abnormality. No enlarged lymph nodes are seen. Reproductive: No adnexal mass. Other: Trace free fluid in the RIGHT lower pelvis. No abscess collection is seen. No free intraperitoneal air. Musculoskeletal: No acute appearing osseous abnormality. Degenerative spondylosis of the thoracolumbar spine, mild to moderate in degree. Scattered fluid stranding within the subcutaneous soft tissues indicating anasarca. IMPRESSION: 1. No acute findings within the abdomen or pelvis. No bowel obstruction. 2. Colonic diverticulosis without evidence of acute diverticulitis. 3. 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 However, the bowel wall thickening appreciated in this area on earlier CT of 10/16/2020 has resolved. On today's study, there is no evidence of acute enteritis, colitis or diverticulitis. 4. Trace free fluid in the RIGHT lower pelvis. No abscess collection is seen. No free intraperitoneal air. 5. Anasarca. Aortic Atherosclerosis (ICD10-I70.0). Electronically Signed   By: Franki Cabot M.D.   On: 10/25/2020 18:42  Scheduled Meds: . celecoxib   200 mg Oral q morning  . feeding supplement  1 Container Oral BID BM  . feeding supplement  237 mL Oral BID BM  . fludrocortisone  0.1 mg Oral Daily  . fluticasone furoate-vilanterol  1 puff Inhalation Q1500  . heparin  5,000 Units Subcutaneous Q8H  . hydrocortisone sod succinate (SOLU-CORTEF) inj  50 mg Intravenous Daily  . mouth rinse  15 mL Mouth Rinse BID  . metoprolol tartrate  5 mg Intravenous Q8H  . multivitamin with minerals  1 tablet Oral Daily  . pantoprazole (PROTONIX) IV  40 mg Intravenous Q24H  . rosuvastatin  20 mg Oral q morning  . sacubitril-valsartan  1 tablet Oral BID  . sertraline  50 mg Oral Daily  . sodium chloride flush  3 mL Intravenous Q12H  . sodium chloride flush  3 mL Intravenous Q12H   Continuous Infusions: . sodium chloride    . sodium chloride Stopped (10/17/20 2313)  . magnesium sulfate bolus IVPB    . piperacillin-tazobactam (ZOSYN)  IV 3.375 g (10/27/20 5397)  . potassium chloride 10 mEq (10/27/20 1020)  . potassium PHOSPHATE IVPB (in mmol)    . sodium bicarbonate (isotonic) 150 mEq in D5W 1000 mL infusion 50 mL/hr at 10/26/20 1828     LOS: 10 days    Time spent: 40 minutes    Irine Seal, MD [facility]   To contact the attending provider between 7A-7P or the covering provider during after hours 7P-7A, please log into the web site [email or web address] and access using universal [place] password for that web site. If you do not have the password, please call the hospital operator.  10/27/2020, 10:36 AM

## 2020-10-27 NOTE — TOC Progression Note (Signed)
Transition of Care [facility]) - Progression Note    Patient Details  Name: [name] MRN: [id number] Date of Birth: 20-Jan-1956  [facility] CM/SW Contact  Ross Ludwig, [place] Phone Number: 10/27/2020, 3:44 PM  Clinical Narrative:     CSW attempted to contact patient to discuss home health agencies, there was no answer.  CSW spoke to [facility], and they are able to provide home health services for patient in [place].  CSW to continue to follow patient's progress throughout discharge planning.   Expected Discharge Plan: Home/Self Care Barriers to Discharge: Continued Medical Work up  Expected Discharge Plan and Services Expected Discharge Plan: Home/Self Care   Discharge Planning Services: CM Consult   Living arrangements for the past 2 months: Single Family Home                                       Social Determinants of Health (SDOH) Interventions    Readmission Risk Interventions No flowsheet data found.

## 2020-10-28 ENCOUNTER — Other Ambulatory Visit: Payer: Self-pay | Admitting: Neurology

## 2020-10-28 DIAGNOSIS — A419 Sepsis, unspecified organism: Secondary | ICD-10-CM | POA: Diagnosis not present

## 2020-10-28 DIAGNOSIS — K5792 Diverticulitis of intestine, part unspecified, without perforation or abscess without bleeding: Secondary | ICD-10-CM | POA: Diagnosis not present

## 2020-10-28 DIAGNOSIS — M545 Low back pain, unspecified: Secondary | ICD-10-CM | POA: Diagnosis not present

## 2020-10-28 DIAGNOSIS — I420 Dilated cardiomyopathy: Secondary | ICD-10-CM | POA: Diagnosis not present

## 2020-10-28 LAB — RENAL FUNCTION PANEL
Albumin: 2.6 g/dL — ABNORMAL LOW (ref 3.5–5.0)
Anion gap: 4 — ABNORMAL LOW (ref 5–15)
BUN: 5 mg/dL — ABNORMAL LOW (ref 8–23)
CO2: 27 mmol/L (ref 22–32)
Calcium: 7.8 mg/dL — ABNORMAL LOW (ref 8.9–10.3)
Chloride: 111 mmol/L (ref 98–111)
Creatinine, Ser: 0.68 mg/dL (ref 0.44–1.00)
GFR, Estimated: >60 mL/min (ref >60)
Glucose, Bld: 89 mg/dL (ref 70–99)
Phosphorus: 2.9 mg/dL (ref 2.5–4.6)
Potassium: 3.4 mmol/L — ABNORMAL LOW (ref 3.5–5.1)
Sodium: 142 mmol/L (ref 135–145)

## 2020-10-28 LAB — GLUCOSE, CAPILLARY: Glucose-Capillary: 87 mg/dL (ref 70–99)

## 2020-10-28 LAB — CBC
HCT: 25.4 % — ABNORMAL LOW (ref 36.0–46.0)
Hemoglobin: 8.3 g/dL — ABNORMAL LOW (ref 12.0–15.0)
MCH: 30.5 pg (ref 26.0–34.0)
MCHC: 32.7 g/dL (ref 30.0–36.0)
MCV: 93.4 fL (ref 80.0–100.0)
Platelets: 416 10*3/uL — ABNORMAL HIGH (ref 150–400)
RBC: 2.72 MIL/uL — ABNORMAL LOW (ref 3.87–5.11)
RDW: 14.6 % (ref 11.5–15.5)
WBC: 9 10*3/uL (ref 4.0–10.5)
nRBC: 0 % (ref 0.0–0.2)

## 2020-10-28 LAB — MAGNESIUM: Magnesium: 2.3 mg/dL (ref 1.7–2.4)

## 2020-10-28 MED ORDER — PANTOPRAZOLE SODIUM 40 MG PO TBEC
40.0000 mg | DELAYED_RELEASE_TABLET | Freq: Every day | ORAL | Status: DC
  Administered 2020-10-29: 40 mg via ORAL
  Filled 2020-10-28: qty 1

## 2020-10-28 MED ORDER — POTASSIUM CHLORIDE CRYS ER 20 MEQ PO TBCR
20.0000 meq | EXTENDED_RELEASE_TABLET | Freq: Once | ORAL | Status: AC
  Administered 2020-10-28: 20 meq via ORAL
  Filled 2020-10-28: qty 1

## 2020-10-28 MED ORDER — DIAZEPAM 5 MG PO TABS
5.0000 mg | ORAL_TABLET | Freq: Three times a day (TID) | ORAL | Status: DC | PRN
  Administered 2020-10-28 – 2020-10-29 (×2): 5 mg via ORAL
  Filled 2020-10-28 (×2): qty 1

## 2020-10-28 NOTE — Progress Notes (Signed)
PROGRESS NOTE    [name]  CZY:[id number] DOB: [birth year] DOA: 10/17/2020 PCP: Lillard Anes, MD    Chief Complaint  Patient presents with  . Abdominal Pain    Brief Narrative:  [name] a 65 y.o.femalewith medical history significant ofarthritis, cardiomyopathy, chronic back pain, COPD, chronic narcotic use, chronic knee pains, chronic systolic congestive heart failure,dual ICD, hyperlipidemia, hypertension, GERD, major depressive disorder, PTSD, recurrent incisional hernia repair status post flap repair 03/15/2015 who presented with abdominal pain x3 weeks intermittent,associated with intermittent nausea but no vomiting. She was seen by PCP in [place] 10/16/20 and return for complete abdominal CT reading "Abnormal posterior, proximal sigmoid multiple colocolonic fistula and adhesion adjacent to a loop, wall thickening, consistent with acute diverticulitis or other infectious or inflammatory colitis."  She was started on empiric IV antibiotics and admitted to the hospital.  She remained hypotensive and was transferred to ICU for pressors.  PCCM, general surgery, GI consulted.  Patient continued to have clinical improvement.  Weaned off pressors.  She was resumed on her home Flor[place].  Patient with significant nausea vomiting over the past 48 hours.  Noted to initially start after physical therapy with associated dizziness, nausea and vomiting with abdominal pain which she believes is secondary to emesis.   Assessment & Plan:   Principal Problem:   Sepsis ([place]) Active Problems:   COPD (chronic obstructive pulmonary disease) (HCC)   Cardiomyopathy (HCC)   Chronic back pain   GERD (gastroesophageal reflux disease)   Chronic systolic congestive heart failure, NYHA class 2 (HCC)   Dilated cardiomyopathy (HCC)   ICD (implantable cardioverter-defibrillator) in place   Dyslipidemia   Essential hypertension   Hypotension   Abdominal pain   Acute diverticulitis    Colonic fistula   Nausea & vomiting  1 septic shock secondary to acute diverticulitis as well as colocolonic fistula Patient had presented with criteria for sepsis with endorgan damage including AKI, hypotension requiring pressors. -Currently off pressors, PCCM was following but have signed off. -Patient assessed by GI did not feel patient's presentation typical of Crohn's/IBD.  Likely to be complication from underlying progressive and severe diverticular disease.  Inflammatory markers ordered.  GI was following peripherally. -Patient seen in consultation by general surgery and no surgical intervention needed at this time. -CRP of 115.26, sed rate 52. -CRP and sed rate trended down. -Blood cultures done 10/17/2020 with single site showing staph epidermidis likely contaminant.  Patient did receive a dose of IV vancomycin as patient had presented in shock. -Repeat blood cultures 10/18/2020 was negative.  Vancomycin discontinued. -Was on IV cefepime, Flagyl and deescalated to Augmentin. -Was on IV Solu-Cortef and weaned back to home regimen of Florinef. -WBC was elevated but slowly trending down. -Patient with ongoing nausea and emesis for 48 hours, with inability to tolerate oral intake including medications per RN on 10/25/2020.. -Abdominal films done 10/23/2020 negative for obstruction. -CT abdomen and pelvis 10/25/2020), with no acute findings within the abdomen or pelvis.  No bowel obstruction.  Colonic diverticulosis without evidence of acute diverticulitis.  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  Bowel wall thickening appreciated in this area on earlier CT has resolved.  No evidence of acute enteritis, colitis or diverticulitis.  Trace free fluid in the right lower pelvis.  No abscess collection is seen.  No free intraperitoneal air.   Anasarca. -Improved clinically.  Was  tolerating full liquid diet and advance to a soft diet today.  -Compazine 10 mg IV every 6 hours as needed nausea and vomiting if uncontrolled by Zofran. -Discontinued Phenergan -May use IV Ativan as needed for intractable nausea vomiting not controlled by Compazine or Zofran. -Discontinued Augmentin and placed on IV Zosyn and subsequently has completed a full course of antibiotics.   -No further antibiotics needed.   -Follow.  2.  Metabolic acidosis Likely secondary to intractable nausea and vomiting.  Resolved on bicarb drip.  Bicarb drip has been discontinued.  Continue IV antiemetics.  Follow.   3.  Intractable nausea vomiting Patient with intractable nausea and vomiting for 48 hours which has since improved.  -CT abdomen and pelvis done (10/25/2020), with no acute findings within the abdomen or pelvis.  No bowel obstruction.  Colonic diverticulosis without evidence of acute diverticulitis.  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  Bowel wall thickening appreciated in this area on earlier CT has resolved.  No evidence of acute enteritis, colitis or diverticulitis.  Trace free fluid in the right lower pelvis.  No abscess collection is seen.  No free intraperitoneal air.  Anasarca. -Continue IV Compazine as needed.   -Change IV PPI to oral PPI.   -Discontinued IV fluids.   -Status post full course IV antibiotics.   -No further antibiotics needed.    -Supportive care.    4.  Chronic systolic CHF/cardiomyopathy with biventricular ICD -2D echo with EF of 35 to 40% (12/15/2019).- -Currently compensated and initially felt to be like on the dry side due to her ongoing nausea and emesis.  -Bicarb drip has been discontinued.  -Urine output of 900 cc over the past 24 hours. -Continue Entresto, Lasix.   Continue to hold Ranexa. -Gentle hydration for another 24 hours. -Continue IV Lopressor and transition to home regimen Coreg. -CT abdomen and pelvis with some concern for anasarca.  Patient with borderline blood pressure.  Status post IV albumin x1 with Lasix 40 mg IV x1.  Back on home regimen oral Lasix.  Follow.   5.  Acute kidney injury Resolved.  6.  Anxiety Topamax resumed.  Continue Zoloft.  IV Ativan as needed.  Resume home regimen Valium 5 mg p.o. every 8 hours as needed.  Supportive care.  Follow.   7.  Chronic pain Continue current pain regimen with Neurontin and oxycodone.    8.  Hyperlipidemia Statin.  9.  Lightheadedness/dizziness Likely secondary to volume depletion, orthostasis from ongoing nausea and vomiting which occurred for 48-hour period.  Initial concern was for vertigo.  Improved clinically.  IV fluids have been discontinued.  Meclizine as needed.   10.  Hypokalemia/hypophosphatemia Phosphorus at 2.9.  Potassium at 3.4.  K. Dur 20 mEq p.o. x1.  Follow.   -   DVT prophylaxis: Heparin Code Status: Full Family Communication: Updated patient.  No family at bedside. Disposition:   Status is: Inpatient    Dispo: The patient is from: Home              Anticipated d/c is to: Home with home health hopefully in the next 1 to 2 days.              Patient currently with improvement with nausea and vomiting, transition to a soft diet.    Difficult to place patient undetermined       Consultants:  PCCM: Dr. Elsworth Soho 10/17/2020  General surgery: Dr.Gerkin 10/17/2020  Gastroenterology: Dr. Therisa Doyne 10/18/2020  Procedures:   Abdominal films 10/23/2020  CT abdomen and pelvis 10/25/2020  Antimicrobials:   Augmentin 10/23/2020 >>>> 10/25/2020  IV cefepime 10/17/2020>>>> 10/23/2020  IV Cipro 2/22/ 2022x1  IV Flagyl 10/17/2020>>>> 10/23/2020  IV Zosyn 10/25/2020>>>>> 10/27/2020  IV vancomycin 10/18/2020>>>> 10/19/2020   Subjective: Laying in bed.  Denies any further  emesis today.  Complaining of nausea and some dry heaves yesterday which she feels is related to potassium supplementation.  States dizziness and lightheadedness has improved.  Stated ate a little bit of soft diet this morning.  Complaining of chronic pain.  Denies any significant abdominal pain.  Overall feeling better.  Objective: Vitals:   10/27/20 2055 10/28/20 0606 10/28/20 0801 10/28/20 0921  BP: 99/63 (!) 91/51  102/62  Pulse: 81 67  74  Resp: 18 16    Temp: 98.5 F (36.9 C) 97.7 F (36.5 C)    TempSrc: Oral     SpO2: 98% 100% 93% 97%  Weight:      Height:        Intake/Output Summary (Last 24 hours) at 10/28/2020 1028 Last data filed at 10/28/2020 0500 Gross per 24 hour  Intake 1140 ml  Output 1000 ml  Net 140 ml   Filed Weights   10/22/20 0500 10/23/20 0500 10/26/20 0859  Weight: 64.6 kg 65.5 kg 64.8 kg    Examination:  General exam: : NAD Respiratory system: Lungs auscultation bilaterally.  No wheezes, no crackles, no rhonchi.  Normal respiratory effort.  Cardiovascular system: RRR no murmurs rubs or gallops.  No JVD.  No lower extremity edema.  Gastrointestinal system: Abdomen soft, nontender, nondistended, positive bowel sounds.  No rebound.  No guarding. Central nervous system: Alert and oriented. No focal neurological deficits. Extremities: Symmetric 5 x 5 power. Skin: No rashes, lesions or ulcers Psychiatry: Judgement and insight appear normal. Mood & affect appropriate.   Data Reviewed: I have personally reviewed following labs and imaging studies  CBC: Recent Labs  Lab 10/24/20 0451 10/25/20 0440 10/26/20 0501 10/27/20 0436 10/28/20 0440  WBC 13.3* 11.9* 10.7* 9.4 9.0  NEUTROABS  --   --  5.6 3.9  --   HGB 9.9* 9.9* 8.9* 8.1* 8.3*  HCT 30.6* 31.0* 28.1* 24.9* 25.4*  MCV 93.3 93.9 95.6 92.6 93.4  PLT 474* 477* 440* 403* 416*    Basic Metabolic Panel: Recent Labs  Lab 10/24/20 0451 10/25/20 0440 10/26/20 0501 10/27/20 0436 10/28/20 0440   NA 142 137 138 141 142  K 3.6 3.5 3.6 3.2* 3.4*  CL 116* 113* 112* 110 111  CO2 20* 16* 19* 24 27  GLUCOSE 86 84 79 92 89  BUN 9 8 6* 7* 5*  CREATININE 0.65 0.61 0.68 0.69 0.68  CALCIUM 8.0* 7.6* 7.4* 7.6* 7.8*  MG  --  2.0 1.9 1.8 2.3  PHOS  --   --  1.4* 2.1* 2.9    GFR: Estimated Creatinine Clearance: 58.1 mL/min (by C-G formula based on SCr of 0.68 mg/dL).  Liver Function Tests: Recent Labs  Lab 10/26/20 0501 10/27/20 0436 10/28/20 0440  ALBUMIN 2.3* 2.7* 2.6*    CBG: Recent Labs  Lab 10/24/20 0748 10/25/20 0739 10/26/20 0853 10/27/20 0756 10/28/20 0736  GLUCAP 90 78 80 95 87     Recent Results (from the past 240 hour(s))  Culture, blood (Routine X 2) w Reflex to ID Panel     Status: None  Collection Time: 10/18/20  2:14 PM   Specimen: BLOOD RIGHT HAND  Result Value Ref Range Status   Specimen Description   Final    BLOOD RIGHT HAND Performed at [facility], [place] [address] [place], [place] [address]    Special Requests   Final    BOTTLES DRAWN AEROBIC ONLY Blood Culture adequate volume Performed at [place] [address] [place], [place] [address]    Culture   Final    NO GROWTH 5 DAYS Performed at [facility], [place] [address], [place], [place] [address]    Report Status 10/23/2020 FINAL  Final  Culture, blood (Routine X 2) w Reflex to ID Panel     Status: None   Collection Time: 10/18/20  2:14 PM   Specimen: BLOOD LEFT HAND  Result Value Ref Range Status   Specimen Description   Final    BLOOD LEFT HAND Performed at [facility] [address] [place], [place] [address]    Special Requests   Final    BOTTLES DRAWN AEROBIC ONLY Blood Culture adequate volume Performed at [facility] [address] [place], [place] [address]    Culture   Final    NO GROWTH 5 DAYS Performed at [facility], [place] [address], [place],  [address]    Report  Status 10/23/2020 FINAL  Final         Radiology Studies: No results found.      Scheduled Meds: . celecoxib  200 mg Oral q morning  . feeding supplement  1 Container Oral BID BM  . feeding supplement  237 mL Oral BID BM  . fludrocortisone  0.1 mg Oral Daily  . fluticasone furoate-vilanterol  1 puff Inhalation Q1500  . furosemide  20 mg Oral Daily  . heparin  5,000 Units Subcutaneous Q8H  . mouth rinse  15 mL Mouth Rinse BID  . metoprolol tartrate  5 mg Intravenous Q8H  . multivitamin with minerals  1 tablet Oral Daily  . pantoprazole (PROTONIX) IV  40 mg Intravenous Q24H  . rosuvastatin  20 mg Oral q morning  . sacubitril-valsartan  1 tablet Oral BID  . sertraline  50 mg Oral Daily  . sodium chloride flush  3 mL Intravenous Q12H  . sodium chloride flush  3 mL Intravenous Q12H  . topiramate  100 mg Oral QHS   Continuous Infusions: . sodium chloride    . sodium chloride Stopped (10/17/20 2313)     LOS: 11 days    Time spent: 35 minutes    Irine Seal, MD [facility]   To contact the attending provider between 7A-7P or the covering provider during after hours 7P-7A, please log into the web site [email or web address] and access using universal [place] password for that web site. If you do not have the password, please call the hospital operator.  10/28/2020, 10:28 AM

## 2020-10-29 DIAGNOSIS — A419 Sepsis, unspecified organism: Secondary | ICD-10-CM | POA: Diagnosis not present

## 2020-10-29 DIAGNOSIS — K5792 Diverticulitis of intestine, part unspecified, without perforation or abscess without bleeding: Secondary | ICD-10-CM

## 2020-10-29 DIAGNOSIS — M545 Low back pain, unspecified: Secondary | ICD-10-CM | POA: Diagnosis not present

## 2020-10-29 DIAGNOSIS — I5022 Chronic systolic (congestive) heart failure: Secondary | ICD-10-CM | POA: Diagnosis not present

## 2020-10-29 LAB — BASIC METABOLIC PANEL
Anion gap: 6 (ref 5–15)
BUN: 5 mg/dL — ABNORMAL LOW (ref 8–23)
CO2: 22 mmol/L (ref 22–32)
Calcium: 8.1 mg/dL — ABNORMAL LOW (ref 8.9–10.3)
Chloride: 113 mmol/L — ABNORMAL HIGH (ref 98–111)
Creatinine, Ser: 0.63 mg/dL (ref 0.44–1.00)
GFR, Estimated: >60 mL/min (ref >60)
Glucose, Bld: 83 mg/dL (ref 70–99)
Potassium: 3.4 mmol/L — ABNORMAL LOW (ref 3.5–5.1)
Sodium: 141 mmol/L (ref 135–145)

## 2020-10-29 LAB — CBC
HCT: 27.1 % — ABNORMAL LOW (ref 36.0–46.0)
Hemoglobin: 8.8 g/dL — ABNORMAL LOW (ref 12.0–15.0)
MCH: 30.6 pg (ref 26.0–34.0)
MCHC: 32.5 g/dL (ref 30.0–36.0)
MCV: 94.1 fL (ref 80.0–100.0)
Platelets: 466 10*3/uL — ABNORMAL HIGH (ref 150–400)
RBC: 2.88 MIL/uL — ABNORMAL LOW (ref 3.87–5.11)
RDW: 14.7 % (ref 11.5–15.5)
WBC: 10.3 10*3/uL (ref 4.0–10.5)
nRBC: 0 % (ref 0.0–0.2)

## 2020-10-29 MED ORDER — POTASSIUM CHLORIDE CRYS ER 20 MEQ PO TBCR: 20.0000 meq | EXTENDED_RELEASE_TABLET | Freq: Once | ORAL | Status: DC

## 2020-10-29 MED ORDER — POTASSIUM CHLORIDE CRYS ER 20 MEQ PO TBCR
40.0000 meq | EXTENDED_RELEASE_TABLET | Freq: Once | ORAL | Status: AC
  Administered 2020-10-29: 40 meq via ORAL
  Filled 2020-10-29: qty 2

## 2020-10-29 MED ORDER — ADULT MULTIVITAMIN W/MINERALS CH: 1.0000 | ORAL_TABLET | Freq: Every day | ORAL | Status: DC

## 2020-10-29 MED ORDER — CARVEDILOL 3.125 MG PO TABS
3.1250 mg | ORAL_TABLET | Freq: Two times a day (BID) | ORAL | Status: DC
  Administered 2020-10-29: 3.125 mg via ORAL
  Filled 2020-10-29: qty 1

## 2020-10-29 MED ORDER — RANOLAZINE ER 500 MG PO TB12: ORAL_TABLET | ORAL | 2 refills | Status: DC

## 2020-10-29 MED ORDER — CARVEDILOL 3.125 MG PO TABS: 3.1250 mg | ORAL_TABLET | Freq: Two times a day (BID) | ORAL | Status: DC

## 2020-10-29 MED ORDER — POTASSIUM CHLORIDE CRYS ER 20 MEQ PO TBCR: 40.0000 meq | EXTENDED_RELEASE_TABLET | Freq: Once | ORAL | Status: DC

## 2020-10-29 MED ORDER — ACETAMINOPHEN 325 MG PO TABS: 650.0000 mg | ORAL_TABLET | Freq: Four times a day (QID) | ORAL | Status: DC | PRN

## 2020-10-29 NOTE — Plan of Care (Signed)
Discharge instructions reviewed with patient, questions answered, verbalized understanding.  Patient transported to main entrance via wheelchair to be taken home by significant other.

## 2020-10-29 NOTE — Discharge Summary (Signed)
Physician Discharge Summary  [name] DXI:[id number] DOB: 1956/01/29 DOA: 10/17/2020  PCP: Lillard Anes, MD  Admit date: 10/17/2020 Discharge date: 10/29/2020  Time spent: 55 minutes  Recommendations for Outpatient Follow-up:  1. Follow-up with Lillard Anes, MD in 2 weeks.  On follow-up patient will need a basic metabolic profile, magnesium level checked to follow-up on electrolytes and renal function.  Patient needs a CBC done to also follow-up on H&H. 2. Follow-up with Dr. Agustin Cree, cardiology as previously scheduled. 3. Patient was discharged home with home health services.   Discharge Diagnoses:  Principal Problem:   Sepsis ([place]) Active Problems:   COPD (chronic obstructive pulmonary disease) (HCC)   Cardiomyopathy (HCC)   Chronic back pain   GERD (gastroesophageal reflux disease)   Chronic systolic congestive heart failure, NYHA class 2 (HCC)   Dilated cardiomyopathy (HCC)   ICD (implantable cardioverter-defibrillator) in place   Dyslipidemia   Essential hypertension   Hypotension   Abdominal pain   Acute diverticulitis   Colonic fistula   Nausea & vomiting   Diverticulitis   Discharge Condition: Stable and improved  Diet recommendation: Heart healthy  Filed Weights   10/22/20 0500 10/23/20 0500 10/26/20 0859  Weight: 64.6 kg 65.5 kg 64.8 kg    History of present illness:  HPI per Dr Rivka Barbara is a 65 y.o. female with medical history significant of arthritis, cardiomyopathy, chronic back pain, COPD, chronic narcotic use, chronic knee pains, chronic systolic congestive heart failure, dual ICD, hyperlipidemia, hypertension, GERD, major depressive disorder, PTSD, recurrent incisional hernia repair status post flap repair 03/15/2015 .Marland Kitchen Presenting with Abdominal pain x3 weeks intermittent, associated with intermittent nausea but no vomiting Was seen by PCP in [place] 1 day prior to admission and returned for complete abdominal CT  reading" multiple colon-colonic fistulas and adhesions adjacent interloop"    Patient Denied having: Fever, Chills, Cough, SOB, Chest Pain,  N//D, headache, dizziness, lightheadedness,  Dysuria, Joint pain, rash, open wounds  ED Course:  On presentation to the ED blood pressure was as low was 79/52 post IV fluid resuscitation 1-2 L Blood pressure is improved to 91/52 Vitals: Temp 97.7, pulse 103, respiratory rate 16, satting 97% on room air Abnormal labs; WBC 15.3, hemoglobin 10.4, platelets 402, neutrophil 12.1, creatinine 1.47, lactic acid 1.1 Influenza A/B negative SARS-CoV-2 screening negative  Outside CT abdomen pelvis --- reported: " Abnormal posterior, proximal sigmoid multiple colo-- colonic fistula and adhesion adjacent to a loop, wall thickening, consistent with acute diverticulitis or other infectious or inflammatory colitis... s/p cholecystectomy and appendectomy Patient was started on IV antibiotics of ciprofloxacin on Flagyl Blood cultures were obtained  ED has called and had an extensive discussion with general surgery Dr. Harlow Asa   Review of Systems: As per HPI, otherwise 10 point review of systems were negative.    Hospital Course:  1 septic shock secondary to acute diverticulitis as well as colocolonic fistula Patient had presented with criteria for sepsis with endorgan damage including AKI, hypotension requiring pressors. -Weaned off pressors, PCCM was following but have signed off. -Patient assessed by GI did not feel patient's presentation typical of Crohn's/IBD.  Likely to be complication from underlying progressive and severe diverticular disease.  Inflammatory markers ordered.  GI was following peripherally. -Patient seen in consultation by general surgery and no surgical intervention needed at this time. -CRP of 115.26, sed rate 52. -CRP and sed rate trended down. -Blood cultures done 10/17/2020 with single site showing staph epidermidis likely contaminant.  Patient did receive a dose of IV vancomycin as patient had presented in shock. -Repeat blood cultures 10/18/2020 was negative.  Vancomycin discontinued. -Was on IV cefepime, Flagyl and deescalated to Augmentin. -Was on IV Solu-Cortef and weaned back to home regimen of Florinef. -WBC was elevated but trended down and leukocytosis had resolved by day of discharge.  -Patient with ongoing nausea and emesis for 48 hours early on during the hospitalization, with inability to tolerate oral intake including medications per RN on 10/25/2020.. -Abdominal films done 10/23/2020 negative for obstruction. -CT abdomen and pelvis 10/25/2020), with no acute findings within the abdomen or pelvis.  No bowel obstruction.  Colonic diverticulosis without evidence of acute diverticulitis.  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  Bowel wall thickening appreciated in this area on earlier CT has resolved.  No evidence of acute enteritis, colitis or diverticulitis.  Trace free fluid in the right lower pelvis.  No abscess collection is seen.  No free intraperitoneal air.  Anasarca. -Improved clinically.   -Placed on a clear liquid diet and diet advanced to full liquid diet and subsequently a soft diet which patient tolerated.  -Compazine 10 mg IV every 6 hours as needed nausea and vomiting if uncontrolled by Zofran. -Discontinued Phenergan -IV Ativan as needed for intractable nausea vomiting not controlled by Compazine or Zofran. -Discontinued Augmentin and placed on IV Zosyn and subsequently completed a full course of antibiotics.   -Patient improved clinically.  Patient had no further abdominal pain.  No nausea or vomiting.  Tolerating a diet.   -Outpatient follow-up with PCP.    2.  Metabolic acidosis Likely secondary to intractable nausea and vomiting.  Resolved on  bicarb drip.   3.  Intractable nausea vomiting Patient with intractable nausea and vomiting for 48 hours which improved and had resolved by day of discharge.   -CT abdomen and pelvis done (10/25/2020), with no acute findings within the abdomen or pelvis.  No bowel obstruction.  Colonic diverticulosis without evidence of acute diverticulitis.  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  Bowel wall thickening appreciated in this area on earlier CT has resolved.  No evidence of acute enteritis, colitis or diverticulitis.  Trace free fluid in the right lower pelvis.  No abscess collection is seen.  No free intraperitoneal air.  Anasarca. -Patient was placed on antiemetics of IV Zofran, IV Compazine as needed.   -Patient also received IV Ativan for nausea which seem to have helped her nausea the most.   -Patient subsequently also placed on PPI.   -Patient improved clinically.   -Diet was advanced from a clear liquid diet to a full liquid and subsequently a soft diet which patient tolerated.   -Patient also completed a course of IV antibiotics during the hospitalization.   -Nausea and vomiting had improved and had resolved by day of discharge.   -Outpatient follow-up with PCP.   4.  Chronic systolic CHF/cardiomyopathy with biventricular ICD -2D echo with EF of 35 to 40% (12/15/2019).- -Patient initially felt to be on the dry side due to her ongoing nausea and emesis and presentation with septic shock..  -Patient hydrated gently.  -Patient maintained on home regimen of Entresto.  Ranexa was held and will be resumed 2 to 3 days post discharge.  Lasix  initially held and subsequently resumed.  Patient had good urine output.  Patient also maintained on beta-blocker.   -CT abdomen and pelvis with some concern for anasarca.  Patient with borderline blood  pressure.  Status post IV albumin x1 with Lasix 40 mg IV x1 with good urine output. -Patient placed back on home regimen of oral Lasix with good urine output. -Patient denied any shortness of breath prior to discharge.  Patient noted not to be hypoxic. -Outpatient follow-up with cardiology as previously scheduled.  5.  Acute kidney injury Resolved.  6.  Anxiety Topamax held during the hospitalization due to a bout of metabolic acidosis but subsequently resumed once acidosis had resolved.  Patient also maintained on home regimen of Zoloft.  Due to nausea and vomiting patient was placed on IV Ativan as needed and once nausea and vomiting had improved and resolved placed back on home regimen of Valium 5 mg p.o. every 8 hours as needed.  Outpatient follow-up.   7.  Chronic pain Patient maintained on home regimen of Neurontin and oxycodone.  Outpatient follow-up with PCP.     8.  Hyperlipidemia Patient maintained on statin.  9.  Lightheadedness/dizziness Likely secondary to volume depletion, orthostasis from ongoing nausea and vomiting which occurred for 48-hour period.  Initial concern was for vertigo.  Patient improved clinically.  Initially hydrated gently with IV fluids which was subsequently discontinued.  Patient maintained on meclizine as needed.  Lightheadedness and dizziness had resolved by day of discharge.  Outpatient follow-up.  10.  Hypokalemia/hypophosphatemia Repleted.  Outpatient follow-up with PCP.    -   Procedures:  Abdominal films 10/23/2020  CT abdomen and pelvis 10/25/2020    Consultations:  PCCM: Dr. Elsworth Soho 10/17/2020  General surgery: Dr.Gerkin 10/17/2020  Gastroenterology: Dr. Therisa Doyne 10/18/2020   Discharge Exam: Vitals:   10/29/20 0832 10/29/20 1315  BP:  99/64  Pulse:  87  Resp:  16  Temp:  97.9 F (36.6 C)  SpO2: 98% 98%    General: NAD Cardiovascular: RRR Respiratory: CTAB  Discharge Instructions   Discharge Instructions    Diet -  low sodium heart healthy   Complete by: As directed    Increase activity slowly   Complete by: As directed      Allergies as of 10/29/2020   No Known Allergies     Medication List    STOP taking these medications   tiZANidine 4 MG tablet Commonly known as: ZANAFLEX     TAKE these medications   acetaminophen 325 MG tablet Commonly known as: TYLENOL Take 2 tablets (650 mg total) by mouth every 6 (six) hours as needed for mild pain (or Fever >/= 101).   aspirin 81 MG tablet Take 81 mg by mouth daily.   Belbuca 900 MCG Film Generic drug: Buprenorphine HCl Place 900 mcg inside cheek every 12 (twelve) hours.   Breo Ellipta 100-25 MCG/INH Aepb Generic drug: fluticasone furoate-vilanterol Inhale 1 puff into the lungs daily in the afternoon.   carvedilol 3.125 MG tablet Commonly known as: COREG TAKE 1 TABLET BY MOUTH TWICE DAILY   celecoxib 200 MG capsule Commonly known as: CELEBREX TAKE ONE CAPSULE BY MOUTH EVERY MORNING   cyclobenzaprine 10 MG tablet Commonly known as: FLEXERIL Take 10 mg by mouth 3 (three) times daily as needed for muscle spasms.   denosumab 60 MG/ML Sosy injection Commonly known as: PROLIA Inject 60 mg into the skin every 6 (six) months.   diazepam 5 MG tablet Commonly known as: VALIUM Take  1 tablet (5 mg total) by mouth 3 (three) times daily as needed. What changed: reasons to take this   diphenoxylate-atropine 2.5-0.025 MG tablet Commonly known as: Lomotil Take 1 tablet by mouth 4 (four) times daily as needed for diarrhea or loose stools.   Entresto 24-26 MG Generic drug: sacubitril-valsartan TAKE ONE TABLET BY MOUTH EVERY MORNING and TAKE ONE TABLET BY MOUTH EVERY EVENING   EPINEPHrine 0.3 mg/0.3 mL Soaj injection Commonly known as: EPI-PEN Inject 0.3 mLs (0.3 mg total) into the muscle as needed for anaphylaxis.   esomeprazole 20 MG capsule Commonly known as: NEXIUM Take 1 capsule (20 mg total) by mouth daily.   fludrocortisone 0.1 MG  tablet Commonly known as: FLORINEF TAKE 1 TABLET BY MOUTH AS DIRECTED. What changed: when to take this   fluticasone 50 MCG/ACT nasal spray Commonly known as: FLONASE Place 2 sprays into both nostrils daily as needed (congestion).   furosemide 40 MG tablet Commonly known as: LASIX TAKE ONE TABLET BY MOUTH EVERY MORNING MAY take 0.5 tablet in THE afternoon IF needed What changed: See the new instructions.   gabapentin 800 MG tablet Commonly known as: NEURONTIN Take 1 tablet (800 mg total) by mouth 3 (three) times daily. Patient usually takes twice a day What changed:   when to take this  reasons to take this  additional instructions   ipratropium 0.02 % nebulizer solution Commonly known as: ATROVENT Take 2.5 mLs (0.5 mg total) by nebulization every 4 (four) hours as needed for wheezing or shortness of breath.   levocetirizine 5 MG tablet Commonly known as: XYZAL Take 5 mg by mouth daily as needed for allergies.   Linzess 145 MCG Caps capsule Generic drug: linaclotide Take 145 mcg by mouth daily as needed (indigestion).   multivitamin with minerals Tabs tablet Take 1 tablet by mouth daily. Start taking on: October 30, 2020   nitroGLYCERIN 0.4 MG SL tablet Commonly known as: NITROSTAT Place 1 tablet (0.4 mg total) under the tongue every 5 (five) minutes as needed for chest pain.   ondansetron 4 MG tablet Commonly known as: Zofran Take 1 tablet (4 mg total) by mouth every 8 (eight) hours as needed for nausea or vomiting.   Oxycodone HCl 10 MG Tabs Take 1 tablet by mouth every 4 (four) hours as needed (pain).   ranolazine 500 MG 12 hr tablet Commonly known as: RANEXA TAKE 1 TABLET(500 MG) BY MOUTH TWICE DAILY Start taking on: October 31, 2020 What changed: These instructions start on October 31, 2020. If you are unsure what to do until then, ask your doctor or other care provider.   rosuvastatin 20 MG tablet Commonly known as: CRESTOR TAKE ONE TABLET BY MOUTH EVERY  MORNING   sertraline 100 MG tablet Commonly known as: ZOLOFT Take 0.5 tablets (50 mg total) by mouth daily.   topiramate 50 MG tablet Commonly known as: Topamax Take 2 tablets (100 mg total) by mouth at bedtime.   Ubrelvy 100 MG Tabs Generic drug: Ubrogepant Take 1 tablet by mouth as needed (May repeat 1 tablet after 2 hours if needed.  Maximum 2 tablets in 24 hours). What changed: when to take this      No Known Allergies  Follow-up Information    [facility] Follow up.   Specialty: Home Health Services Why: agency will provide home health services. Contact information: [address]  [place] [address] [phone]        Lillard Anes, MD. Schedule  an appointment as soon as possible for a visit in 2 week(s).   Specialty: Family Medicine Contact information: [address] [address] [phone]        Constance Haw, MD .   Specialty: Cardiology Contact information: [place] [address] [phone]        Park Liter, MD Follow up today.   Specialty: Cardiology Why: F/U AS SCHEDULED. Contact information: [place] [address] [phone]                The results of significant diagnostics from this hospitalization (including imaging, microbiology, ancillary and laboratory) are listed below for reference.    Significant Diagnostic Studies: DG Abd 1 View  Result Date: 10/23/2020 CLINICAL DATA:  Nausea and vomiting EXAM: ABDOMEN - 1 VIEW COMPARISON:  03/27/2015 FINDINGS: Scattered large and small bowel gas is noted. No obstructive changes are seen. No free air is noted. Postsurgical changes are seen in the right hip. Defibrillator is noted. IMPRESSION: No obstructive changes seen. Electronically Signed   By: Inez Catalina M.D.   On: 10/23/2020 17:44   CT ABDOMEN PELVIS W CONTRAST  Result Date: 10/25/2020 CLINICAL DATA:  Nausea,  vomiting, abdominal pain. EXAM: CT ABDOMEN AND PELVIS WITH CONTRAST TECHNIQUE: Multidetector CT imaging of the abdomen and pelvis was performed using the standard protocol following bolus administration of intravenous contrast. CONTRAST:  143mL OMNIPAQUE IOHEXOL 300 MG/ML  SOLN COMPARISON:  CT abdomen dated 10/16/2020 FINDINGS: Lower chest: Mild bibasilar atelectasis. Hepatobiliary: Status post cholecystectomy. No focal liver abnormality is seen. No significant bile duct dilatation. Pancreas: Unremarkable. No pancreatic ductal dilatation or surrounding inflammatory changes. Spleen: Normal in size without focal abnormality. Adrenals/Urinary Tract: Adrenal glands appear normal. Small bilateral renal cysts. No suspicious mass, stone or hydronephrosis. Chronic perinephric fluid stranding bilaterally. No ureteral or bladder calculi are identified. Bladder is unremarkable, partially decompressed. Stomach/Bowel: No dilated large or small bowel loops. Scattered diverticulosis throughout the colon, most confluent within the descending and sigmoid colon, but no focal inflammatory changes to suggest acute diverticulitis. There is redemonstration of a coloenteric fistula, and configuration again suggests additional colo colonic fistula, with associated adhesions. However, the bowel wall thickening appreciated in this area on earlier CT of 10/16/2020 has resolved. On today's study, there is no evidence of acute enteritis or colitis. Vascular/Lymphatic: Aortic atherosclerosis. No acute appearing vascular abnormality. No enlarged lymph nodes are seen. Reproductive: No adnexal mass. Other: Trace free fluid in the RIGHT lower pelvis. No abscess collection is seen. No free intraperitoneal air. Musculoskeletal: No acute appearing osseous abnormality. Degenerative spondylosis of the thoracolumbar spine, mild to moderate in degree. Scattered fluid stranding within the subcutaneous soft tissues indicating anasarca. IMPRESSION: 1. No  acute findings within the abdomen or pelvis. No bowel obstruction. 2. Colonic diverticulosis without evidence of acute diverticulitis. 3. 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 However, the bowel wall thickening appreciated in this area on earlier CT of 10/16/2020 has resolved. On today's study, there is no evidence of acute enteritis, colitis or diverticulitis. 4. Trace free fluid in the RIGHT lower pelvis. No abscess collection is seen. No free intraperitoneal air. 5. Anasarca. Aortic Atherosclerosis (ICD10-I70.0). Electronically Signed   By: Franki Cabot M.D.   On: 10/25/2020 18:42    Microbiology: No results found for this or any  previous visit (from the past 240 hour(s)).   Labs: Basic Metabolic Panel: Recent Labs  Lab 10/25/20 0440 10/26/20 0501 10/27/20 0436 10/28/20 0440 10/29/20 0429  NA 137 138 141 142 141  K 3.5 3.6 3.2* 3.4* 3.4*  CL 113* 112* 110 111 113*  CO2 16* 19* 24 27 22   GLUCOSE 84 79 92 89 83  BUN 8 6* 7* 5* 5*  CREATININE 0.61 0.68 0.69 0.68 0.63  CALCIUM 7.6* 7.4* 7.6* 7.8* 8.1*  MG 2.0 1.9 1.8 2.3  --   PHOS  --  1.4* 2.1* 2.9  --    Liver Function Tests: Recent Labs  Lab 10/26/20 0501 10/27/20 0436 10/28/20 0440  ALBUMIN 2.3* 2.7* 2.6*   No results for input(s): LIPASE, AMYLASE in the last 168 hours. No results for input(s): AMMONIA in the last 168 hours. CBC: Recent Labs  Lab 10/25/20 0440 10/26/20 0501 10/27/20 0436 10/28/20 0440 10/29/20 0429  WBC 11.9* 10.7* 9.4 9.0 10.3  NEUTROABS  --  5.6 3.9  --   --   HGB 9.9* 8.9* 8.1* 8.3* 8.8*  HCT 31.0* 28.1* 24.9* 25.4* 27.1*  MCV 93.9 95.6 92.6 93.4 94.1  PLT 477* 440* 403* 416* 466*   Cardiac Enzymes: No results for input(s): CKTOTAL, CKMB, CKMBINDEX, TROPONINI in the last 168 hours. BNP: BNP (last 3  results) Recent Labs    10/18/20 0252  BNP 193.4*    ProBNP (last 3 results) Recent Labs    12/02/19 1055 09/05/20 1138  PROBNP 1,044* 546*    CBG: Recent Labs  Lab 10/24/20 0748 10/25/20 0739 10/26/20 0853 10/27/20 0756 10/28/20 0736  GLUCAP 90 78 80 95 87       Signed:  Irine Seal MD.  [facility] 10/29/2020, 3:16 PM

## 2020-10-30 ENCOUNTER — Other Ambulatory Visit: Payer: Self-pay | Admitting: Legal Medicine

## 2020-10-31 ENCOUNTER — Ambulatory Visit (INDEPENDENT_AMBULATORY_CARE_PROVIDER_SITE_OTHER): Payer: 59

## 2020-10-31 ENCOUNTER — Telehealth: Payer: Self-pay

## 2020-10-31 ENCOUNTER — Other Ambulatory Visit: Payer: Self-pay

## 2020-10-31 DIAGNOSIS — I5022 Chronic systolic (congestive) heart failure: Secondary | ICD-10-CM | POA: Diagnosis not present

## 2020-10-31 DIAGNOSIS — I10 Essential (primary) hypertension: Secondary | ICD-10-CM | POA: Diagnosis not present

## 2020-10-31 NOTE — Patient Instructions (Addendum)
Visit Information  Goals Addressed            This Visit's Progress   . Learn More About My Health       Timeframe:  Long-Range Goal Priority:  High Start Date:       10/31/20                       Expected End Date:  10/31/2021                       Follow Up Date 05/01/2021    - tell my story and reason for my visit - repeat what I heard to make sure I understand - bring a list of my medicines to the visit - speak up when I don't understand    Why is this important?    The best way to learn about your health and care is by talking to the doctor and nurse.   They will answer your questions and give you information in the way that you like best.    Notes:     . Lifestyle Change-Hypertension       Timeframe:  Long-Range Goal Priority:  High Start Date:     10/31/2020                        Expected End Date: 10/31/2021                      Follow Up Date 05/01/2021    - agree to work together to make changes    Why is this important?    The changes that you are asked to make may be hard to do.   This is especially true when the changes are life-long.   Knowing why it is important to you is the first step.   Working on the change with your family or support person helps you not feel alone.   Reward yourself and family or support person when goals are met. This can be an activity you choose like bowling, hiking, biking, swimming or shooting hoops.     Notes:     Marland Kitchen Manage My Medicine       Timeframe:  Long-Range Goal Priority:  High Start Date:               10/31/20               Expected End Date:  10/31/2021                     Follow Up Date 05/01/2021    - call for medicine refill 2 or 3 days before it runs out - keep a list of all the medicines I take; vitamins and herbals too - use a pillbox to sort medicine    Why is this important?   . These steps will help you keep on track with your medicines.   Notes:       Patient Care Plan: ccm  pharmacy care plan    Problem Identified: chf and htn   Priority: High  Onset Date: 10/31/2020    Long-Range Goal: Disease Management   Start Date: 10/31/2020  Expected End Date: 10/31/2021  This Visit's Progress: On track  Priority: High  Note:    Current Barriers:  . Unable to self administer medications as prescribed  Pharmacist Clinical Goal(s):  Marland Kitchen Over the  next 90 days, patient will achieve ability to self administer medications as prescribed through use of pharmacy delivery services as evidenced by patient report through collaboration with PharmD and provider.   Interventions: . 1:1 collaboration with Lillard Anes, MD regarding development and update of comprehensive plan of care as evidenced by provider attestation and co-signature . Inter-disciplinary care team collaboration (see longitudinal plan of care) . Comprehensive medication review performed; medication list updated in electronic medical record  Hypertension (BP goal <130/80) -Controlled -Current treatment: . Carvedilol 3.125 mg bid  -Medications previously tried: n/a  -Current home readings: not checking since coming home from hospital -Current dietary habits: eating pretzels, potato chips and tomato soup since coming home from hospital -Current exercise habits: beginning PT this week after hospitaliztion -Denies hypotensive/hypertensive symptoms -Educated on BP goals and benefits of medications for prevention of heart attack, stroke and kidney damage; Daily salt intake goal < 2300 mg; Exercise goal of 150 minutes per week; Importance of home blood pressure monitoring; Symptoms of hypotension and importance of maintaining adequate hydration; -Counseled to monitor BP at home daily, document, and provide log at future appointments -Counseled on diet and exercise extensively Recommended to continue current medication Recommended beginning to check bp again now that she's home.   Heart Failure (Goal: manage  symptoms and prevent exacerbations) -Controlled -Last ejection fraction: 30-35% (Date: 2018) -HF type: Systolic -NYHA Class: II (slight limitation of activity) -Current treatment: . Carvedilol 3.125 mg bid . Entresto 24-26 mg bid . Furosemide 20 mg daily prn  . Ranolazine 500 mg bid  -Medications previously tried: n/a  -Current home BP/HR readings: not checking since coming home from hospital -Current dietary habits: pretzels, tomato soup and potato chips -Current exercise habits: beginning PT this week -Educated on Benefits of medications for managing symptoms and prolonging life Importance of weighing daily; if you gain more than 3 pounds in one day or 5 pounds in one week,    Importance of blood pressure control -Counseled on diet and exercise extensively Recommended to continue current medication Recommended beginning to check blood pressure daily  Collaborated with receptionist to [facility] follow-up visit.       Patient Goals/Self-Care Activities . Over the next 90 days, patient will:  - take medications as prescribed focus on medication adherence by using pill box check blood pressure daily, document, and provide at future appointments engage in dietary modifications by limiting salt in diet  Follow Up Plan: Telephone follow up appointment with care management team member scheduled for: 05/01/2021      Patient verbalizes understanding of instructions provided today and agrees to view in [place].  Telephone follow up appointment with pharmacy team member scheduled for: 05/01/2021  Burnice Logan, [facility]  [email or web address]">  DASH Eating Plan DASH stands for Dietary Approaches to Stop Hypertension. The DASH eating plan is a healthy eating plan that has been shown to:  Reduce high blood pressure (hypertension).  Reduce your risk for type 2 diabetes, heart disease, and stroke.  Help with weight loss. What are tips  for following this plan? Reading food labels  Check food labels for the amount of salt (sodium) per serving. Choose foods with less than 5 percent of the Daily Value of sodium. Generally, foods with less than 300 milligrams (mg) of sodium per serving fit into this eating plan.  To find whole grains, look for the word "whole" as the first word in the ingredient list. Shopping  Buy products labeled as "low-sodium" or "no salt  added."  Buy fresh foods. Avoid canned foods and pre-made or frozen meals. Cooking  Avoid adding salt when cooking. Use salt-free seasonings or herbs instead of table salt or sea salt. Check with your health care provider or pharmacist before using salt substitutes.  Do not fry foods. Cook foods using healthy methods such as baking, boiling, grilling, roasting, and broiling instead.  Cook with heart-healthy oils, such as olive, canola, avocado, soybean, or sunflower oil. Meal planning  Eat a balanced diet that includes: ? 4 or more servings of fruits and 4 or more servings of vegetables each day. Try to fill one-half of your plate with fruits and vegetables. ? 6-8 servings of whole grains each day. ? Less than 6 oz (170 g) of lean meat, poultry, or fish each day. A 3-oz (85-g) serving of meat is about the same size as a deck of cards. One egg equals 1 oz (28 g). ? 2-3 servings of low-fat dairy each day. One serving is 1 cup (237 mL). ? 1 serving of nuts, seeds, or beans 5 times each week. ? 2-3 servings of heart-healthy fats. Healthy fats called omega-3 fatty acids are found in foods such as walnuts, flaxseeds, fortified milks, and eggs. These fats are also found in cold-water fish, such as sardines, salmon, and mackerel.  Limit how much you eat of: ? Canned or prepackaged foods. ? Food that is high in trans fat, such as some fried foods. ? Food that is high in saturated fat, such as fatty meat. ? Desserts and other sweets, sugary drinks, and other foods with added  sugar. ? Full-fat dairy products.  Do not salt foods before eating.  Do not eat more than 4 egg yolks a week.  Try to eat at least 2 vegetarian meals a week.  Eat more home-cooked food and less restaurant, buffet, and fast food.   Lifestyle  When eating at a restaurant, ask that your food be prepared with less salt or no salt, if possible.  If you drink alcohol: ? Limit how much you use to:  0-1 drink a day for women who are not pregnant.  0-2 drinks a day for men. ? Be aware of how much alcohol is in your drink. In the [place] one drink equals one 12 oz bottle of beer (355 mL), one 5 oz glass of wine (148 mL), or one 1 oz glass of hard liquor (44 mL). General information  Avoid eating more than 2,300 mg of salt a day. If you have hypertension, you may need to reduce your sodium intake to 1,500 mg a day.  Work with your health care provider to maintain a healthy body weight or to lose weight. Ask what an ideal weight is for you.  Get at least 30 minutes of exercise that causes your heart to beat faster (aerobic exercise) most days of the week. Activities may include walking, swimming, or biking.  Work with your health care provider or dietitian to adjust your eating plan to your individual calorie needs. What foods should I eat? Fruits All fresh, dried, or frozen fruit. Canned fruit in natural juice (without added sugar). Vegetables Fresh or frozen vegetables (raw, steamed, roasted, or grilled). Low-sodium or reduced-sodium tomato and vegetable juice. Low-sodium or reduced-sodium tomato sauce and tomato paste. Low-sodium or reduced-sodium canned vegetables. Grains Whole-grain or whole-wheat bread. Whole-grain or whole-wheat pasta. Chantrell Apsey rice. [place]ena Morrow. Bulgur. Whole-grain and low-sodium cereals. Pita bread. Low-fat, low-sodium crackers. Whole-wheat flour tortillas. Meats and other proteins Skinless  chicken or [place]. Ground chicken or [place]. Pork with fat trimmed off.  Fish and seafood. Egg whites. Dried beans, peas, or lentils. Unsalted nuts, nut butters, and seeds. Unsalted canned beans. Lean cuts of beef with fat trimmed off. Low-sodium, lean precooked or cured meat, such as sausages or meat loaves. Dairy Low-fat (1%) or fat-free (skim) milk. Reduced-fat, low-fat, or fat-free cheeses. Nonfat, low-sodium ricotta or cottage cheese. Low-fat or nonfat yogurt. Low-fat, low-sodium cheese. Fats and oils Soft margarine without trans fats. Vegetable oil. Reduced-fat, low-fat, or light mayonnaise and salad dressings (reduced-sodium). Canola, safflower, olive, avocado, soybean, and sunflower oils. Avocado. Seasonings and condiments Herbs. Spices. Seasoning mixes without salt. Other foods Unsalted popcorn and pretzels. Fat-free sweets. The items listed above may not be a complete list of foods and beverages you can eat. Contact a dietitian for more information. What foods should I avoid? Fruits Canned fruit in a light or heavy syrup. Fried fruit. Fruit in cream or butter sauce. Vegetables Creamed or fried vegetables. Vegetables in a cheese sauce. Regular canned vegetables (not low-sodium or reduced-sodium). Regular canned tomato sauce and paste (not low-sodium or reduced-sodium). Regular tomato and vegetable juice (not low-sodium or reduced-sodium). Angie Fava. Olives. Grains Baked goods made with fat, such as croissants, muffins, or some breads. Dry pasta or rice meal packs. Meats and other proteins Fatty cuts of meat. Ribs. Fried meat. Berniece Salines. Bologna, salami, and other precooked or cured meats, such as sausages or meat loaves. Fat from the back of a pig (fatback). Bratwurst. Salted nuts and seeds. Canned beans with added salt. Canned or smoked fish. Whole eggs or egg yolks. Chicken or [place] with skin. Dairy Whole or 2% milk, cream, and half-and-half. Whole or full-fat cream cheese. Whole-fat or sweetened yogurt. Full-fat cheese. Nondairy creamers. Whipped toppings.  Processed cheese and cheese spreads. Fats and oils Butter. Stick margarine. Lard. Shortening. Ghee. Bacon fat. Tropical oils, such as coconut, palm kernel, or palm oil. Seasonings and condiments Onion salt, garlic salt, seasoned salt, table salt, and sea salt. Worcestershire sauce. Tartar sauce. Barbecue sauce. Teriyaki sauce. Soy sauce, including reduced-sodium. Steak sauce. Canned and packaged gravies. Fish sauce. Oyster sauce. Cocktail sauce. Store-bought horseradish. Ketchup. Mustard. Meat flavorings and tenderizers. Bouillon cubes. Hot sauces. Pre-made or packaged marinades. Pre-made or packaged taco seasonings. Relishes. Regular salad dressings. Other foods Salted popcorn and pretzels. The items listed above may not be a complete list of foods and beverages you should avoid. Contact a dietitian for more information. Where to find more information  National Heart, Lung, and Blood Institute: https://wilson-eaton.com/  American Heart Association: www.heart.org  Academy of Nutrition and Dietetics: www.eatright.Tamms: www.kidney.org Summary  The DASH eating plan is a healthy eating plan that has been shown to reduce high blood pressure (hypertension). It may also reduce your risk for type 2 diabetes, heart disease, and stroke.  When on the DASH eating plan, aim to eat more fresh fruits and vegetables, whole grains, lean proteins, low-fat dairy, and heart-healthy fats.  With the DASH eating plan, you should limit salt (sodium) intake to 2,300 mg a day. If you have hypertension, you may need to reduce your sodium intake to 1,500 mg a day.  Work with your health care provider or dietitian to adjust your eating plan to your individual calorie needs. This information is not intended to replace advice given to you by your health care provider. Make sure you discuss any questions you have with your health care provider. Document Revised: 07/16/2019 Document Reviewed:  07/16/2019 Elsevier Patient Education  2021 [employer].

## 2020-10-31 NOTE — Progress Notes (Signed)
Chronic Care Management Pharmacy Assistant   Name: IRAM ASTORINO  MRN: [id number] DOB: [birth year]   Reason for Encounter: Medication Review for acute medication delivery    Recent office visits:  10/31/20-CPP  Recent consult visits:    Hospital visits:  09/27/20-ED Diverticulitis   Medications: Outpatient Encounter Medications as of 10/31/2020  Medication Sig   acetaminophen (TYLENOL) 325 MG tablet Take 2 tablets (650 mg total) by mouth every 6 (six) hours as needed for mild pain (or Fever >/= 101).   aspirin 81 MG tablet Take 81 mg by mouth daily.   Buprenorphine HCl (BELBUCA) 900 MCG FILM Place 900 mcg inside cheek every 12 (twelve) hours.   carvedilol (COREG) 3.125 MG tablet TAKE 1 TABLET BY MOUTH TWICE DAILY   celecoxib (CELEBREX) 200 MG capsule TAKE ONE CAPSULE BY MOUTH EVERY MORNING   cyclobenzaprine (FLEXERIL) 10 MG tablet Take 10 mg by mouth 3 (three) times daily as needed for muscle spasms.   denosumab (PROLIA) 60 MG/ML SOSY injection Inject 60 mg into the skin every 6 (six) months.   diazepam (VALIUM) 5 MG tablet Take 1 tablet (5 mg total) by mouth 3 (three) times daily as needed. (Patient taking differently: Take 5 mg by mouth 3 (three) times daily as needed for anxiety.)   diphenoxylate-atropine (LOMOTIL) 2.5-0.025 MG tablet Take 1 tablet by mouth 4 (four) times daily as needed for diarrhea or loose stools.   ENTRESTO 24-26 MG TAKE ONE TABLET BY MOUTH EVERY MORNING and TAKE ONE TABLET BY MOUTH EVERY EVENING   EPINEPHRINE 0.3 mg/0.3 mL IJ SOAJ injection Inject 0.3 mLs (0.3 mg total) into the muscle as needed for anaphylaxis.   esomeprazole (NEXIUM) 20 MG capsule Take 1 capsule (20 mg total) by mouth daily.   fludrocortisone (FLORINEF) 0.1 MG tablet TAKE 1 TABLET BY MOUTH AS DIRECTED. (Patient taking differently: Take 0.1 mg by mouth daily.)   fluticasone (FLONASE) 50 MCG/ACT nasal spray Place 2 sprays into both nostrils daily as needed (congestion).    fluticasone furoate-vilanterol (BREO ELLIPTA) 100-25 MCG/INH AEPB Inhale 1 puff into the lungs daily in the afternoon.   furosemide (LASIX) 40 MG tablet TAKE ONE TABLET BY MOUTH EVERY MORNING MAY take 0.5 tablet in THE afternoon IF needed (Patient taking differently: Take 20 mg by mouth daily as needed for fluid.)   gabapentin (NEURONTIN) 800 MG tablet Take 1 tablet (800 mg total) by mouth 3 (three) times daily. Patient usually takes twice a day (Patient taking differently: Take 800 mg by mouth 2 (two) times daily as needed (pain).)   ipratropium (ATROVENT) 0.02 % nebulizer solution Take 2.5 mLs (0.5 mg total) by nebulization every 4 (four) hours as needed for wheezing or shortness of breath.   levocetirizine (XYZAL) 5 MG tablet Take 5 mg by mouth daily as needed for allergies.   LINZESS 145 MCG CAPS capsule Take 145 mcg by mouth daily as needed (indigestion).   Multiple Vitamin (MULTIVITAMIN WITH MINERALS) TABS tablet Take 1 tablet by mouth daily.   nitroGLYCERIN (NITROSTAT) 0.4 MG SL tablet Place 1 tablet (0.4 mg total) under the tongue every 5 (five) minutes as needed for chest pain.   ondansetron (ZOFRAN) 4 MG tablet Take 1 tablet (4 mg total) by mouth every 8 (eight) hours as needed for nausea or vomiting.   Oxycodone HCl 10 MG TABS Take 1 tablet by mouth every 4 (four) hours as needed (pain).   ranolazine (RANEXA) 500 MG 12 hr tablet TAKE 1 TABLET(500 MG) BY  MOUTH TWICE DAILY   rosuvastatin (CRESTOR) 20 MG tablet TAKE ONE TABLET BY MOUTH EVERY MORNING   sertraline (ZOLOFT) 100 MG tablet Take 0.5 tablets (50 mg total) by mouth daily.   topiramate (TOPAMAX) 50 MG tablet Take 2 tablets (100 mg total) by mouth at bedtime.   UBRELVY 100 MG TABS TAKE 1 TABLET BY MOUTH BY MOUTH AS NEEDED( MAY REPEAT 1 TABLET AFTER 2 HOURS IF NEEDED, MAXIMUM 2 TABLETS IN 24 HOURS)   No facility-administered encounter medications on file as of 10/31/2020.    Reviewed chart for medication changes ahead of  medication coordination call.  No OVs, Consults, or hospital visits since last care coordination call/Pharmacist visit. (If appropriate, list visit date, provider name)  No medication changes indicated OR if recent visit, treatment plan here.  BP Readings from Last 3 Encounters:  10/29/20 99/64  10/16/20 (!) 80/58  10/05/20 104/72    No results found for: HGBA1C   Patient obtains medications through Vials  90 Days    I have prepared and acute fill needed for 11/01/20, and sent it to Donette Larry, CPP for approval  This delivery to include: Ranolazine   500mg  Diazepam 5mg  Furosemide 40mg  Nitroglycerin   0.4mg   Patient needs refills for None  Confirmed delivery date of 11/01/20, advised patient that pharmacy will contact them the morning of delivery.  I have called [facility] ([facility]) to ger her a refill for Tizanidine sent to her pharmacy. The patient called and left a message on 10/29/20 that was not responded too according to the receptionist.  She is putting in a high priority message today 10/31/20 to reach out to the patient about this refill.

## 2020-10-31 NOTE — Progress Notes (Signed)
Chronic Care Management Pharmacy Note  10/31/2020 Name:  [name] MRN:  [id number] DOB:  February 02, 1956  Subjective: [name] is an 65 y.o. year old female who is a primary patient of Henrene Pastor Zeb Comfort, MD.  The CCM team was consulted for assistance with disease management and care coordination needs.    Plan Recommendation:   Patient reports coming home from hospital Sunday 03/06. Discussed that her bp was running low while in hospital but hasn't checked since coming home. Encouraged patient to begin checking blood pressure while home.   Coordinated hospital follow-up visit scheduling.   Patient reports limited diet since coming home. States she is eating pretzels, potato chips and tomato soup currently. Denies swelling or fluid retention.   Engaged with patient by telephone for follow up visit in response to provider referral for pharmacy case management and/or care coordination services.   Consent to Services:  The patient was given information about Chronic Care Management services, agreed to services, and gave verbal consent prior to initiation of services.  Please see initial visit note for detailed documentation.   Patient Care Team: Lillard Anes, MD as PCP - General (Family Medicine) Constance Haw, MD as PCP - Electrophysiology (Cardiology) Park Liter, MD as PCP - Cardiology (Cardiology) Newman Pies, MD as Consulting Physician (Neurosurgery) Park Liter, MD as Consulting Physician (Cardiology) Michael Boston, MD as Consulting Physician (General Surgery) Pieter Partridge, DO as Consulting Physician (Neurology) Burnice Logan, [facility] as Pharmacist (Pharmacist)  Recent office visits: 10/16/2020 - abdominal pain. Ordered CT scan.  10/05/2020 - sick with cough and chills. Flu negative and COVID treat with Levaquin.  Recent consult visits:   Hospital visits: 10/17/2020 - ED to hospital admission Diverticulitis.    Objective:  Lab Results  Component Value Date   CREATININE 0.63 10/29/2020   BUN 5 (L) 10/29/2020   GFRNONAA >60 10/29/2020   GFRAA 57 (L) 09/05/2020   NA 141 10/29/2020   K 3.4 (L) 10/29/2020   CALCIUM 8.1 (L) 10/29/2020   CO2 22 10/29/2020    No results found for: HGBA1C, FRUCTOSAMINE, GFR, MICROALBUR  Last diabetic Eye exam: No results found for: HMDIABEYEEXA  Last diabetic Foot exam: No results found for: HMDIABFOOTEX   Lab Results  Component Value Date   CHOL 141 07/05/2020   HDL 46 07/05/2020   LDLCALC 72 07/05/2020   TRIG 127 07/05/2020   CHOLHDL 3.1 07/05/2020    Hepatic Function Latest Ref Rng & Units 10/28/2020 10/27/2020 10/26/2020  Total Protein 6.5 - 8.1 g/dL - - -  Albumin 3.5 - 5.0 g/dL 2.6(L) 2.7(L) 2.3(L)  AST 15 - 41 U/L - - -  ALT 0 - 44 U/L - - -  Alk Phosphatase 38 - 126 U/L - - -  Total Bilirubin 0.3 - 1.2 mg/dL - - -    Lab Results  Component Value Date/Time   TSH 0.987 07/05/2020 12:14 PM   TSH 0.882 02/15/2020 02:07 PM    CBC Latest Ref Rng & Units 10/29/2020 10/28/2020 10/27/2020  WBC 4.0 - 10.5 K/uL 10.3 9.0 9.4  Hemoglobin 12.0 - 15.0 g/dL 8.8(L) 8.3(L) 8.1(L)  Hematocrit 36.0 - 46.0 % 27.1(L) 25.4(L) 24.9(L)  Platelets 150 - 400 K/uL 466(H) 416(H) 403(H)    No results found for: VD25OH  Clinical ASCVD: Yes  The 10-year ASCVD risk score Mikey Bussing DC Jr., et al., 2013) is: 4.5%   Values used to calculate the score:     Age:  30 years     Sex: Female     Is Non-Hispanic African American: No     Diabetic: No     Tobacco smoker: No     Systolic Blood Pressure: 99 mmHg     Is BP treated: Yes     HDL Cholesterol: 46 mg/dL     Total Cholesterol: 141 mg/dL    Depression screen [facility] 2/9 08/11/2020 07/05/2020 03/29/2020  Decreased Interest _0 Down, Depressed, Hopeless _1 PHQ - 2 Score _2 Altered sleeping 3 3 0  Tired, decreased energy _3 Change in appetite _4 Feeling bad or failure about yourself  3 2 0  Trouble  concentrating 2 2 0  Moving slowly or fidgety/restless 3 3 0  Suicidal thoughts 2 0 0  PHQ-9 Score _5 Difficult doing work/chores Somewhat difficult Not difficult at all Not difficult at all      Social History   Tobacco Use  Smoking Status Former Smoker  . Packs/day: 0.50  . Years: 30.00  . Pack years: 15.00  . Types: Cigarettes  . Quit date: 03/2020  . Years since quitting: 0.6  Smokeless Tobacco Never Used   BP Readings from Last 3 Encounters:  10/29/20 99/64  10/16/20 (!) 80/58  10/05/20 104/72   Pulse Readings from Last 3 Encounters:  10/29/20 87  10/16/20 94  09/05/20 (!) 54   Wt Readings from Last 3 Encounters:  10/26/20 142 lb 13.7 oz (64.8 kg)  10/16/20 128 lb 8 oz (58.3 kg)  10/05/20 130 lb (59 kg)    Assessment/Interventions: Review of patient past medical history, allergies, medications, health status, including review of consultants reports, laboratory and other test data, was performed as part of comprehensive evaluation and provision of chronic care management services.   SDOH:  (Social Determinants of Health) assessments and interventions performed: Yes   CCM Care Plan  No Known Allergies  Medications Reviewed Today    Reviewed by Burnice Logan, [facility] (Pharmacist) on 10/31/20 at 1420  Med List Status: <None>  Medication Order Taking? Sig Documenting Provider Last Dose Status Informant  acetaminophen (TYLENOL) 325 MG tablet [id number] Yes Take 2 tablets (650 mg total) by mouth every 6 (six) hours as needed for mild pain (or Fever >/= 101). Eugenie Filler, MD Taking Active   aspirin 81 MG tablet [id number] Yes Take 81 mg by mouth daily. [provider] Taking Active Self  Buprenorphine HCl (BELBUCA) 900 MCG FILM [id number] Yes Place 900 mcg inside cheek every 12 (twelve) hours. [provider] Taking Active Self  carvedilol (COREG) 3.125 MG tablet [id number] Yes TAKE 1 TABLET BY MOUTH TWICE DAILY Lillard Anes, MD Taking  Active Self  celecoxib (CELEBREX) 200 MG capsule [id number] Yes TAKE ONE CAPSULE BY MOUTH EVERY MORNING Lillard Anes, MD Taking Active Self  cyclobenzaprine (FLEXERIL) 10 MG tablet [id number] Yes Take 10 mg by mouth 3 (three) times daily as needed for muscle spasms. [provider] Taking Active Self  denosumab (PROLIA) 60 MG/ML SOSY injection [id number] Yes Inject 60 mg into the skin every 6 (six) months. Lillard Anes, MD Taking Active Self  diazepam (VALIUM) 5 MG tablet [id number] Yes Take 1 tablet (5 mg total) by mouth 3 (three) times daily as needed.  Patient taking differently: Take 5 mg by mouth 3 (three) times daily as needed for anxiety.   Lillard Anes, MD Taking Active  diphenoxylate-atropine (LOMOTIL) 2.5-0.025 MG tablet [id number] Yes Take 1 tablet by mouth 4 (four) times daily as needed for diarrhea or loose stools. Lillard Anes, MD Taking Active Self  ENTRESTO 24-26 [place] [id number] Yes TAKE ONE TABLET BY MOUTH EVERY MORNING and TAKE ONE TABLET BY MOUTH EVERY Newell Coral, MD Taking Active Self  EPINEPHRINE 0.3 mg/0.3 mL IJ SOAJ injection [id number] Yes Inject 0.3 mLs (0.3 mg total) into the muscle as needed for anaphylaxis. Lillard Anes, MD Taking Active Self  esomeprazole (NEXIUM) 20 MG capsule [id number] Yes Take 1 capsule (20 mg total) by mouth daily. Lillard Anes, MD Taking Active Self  fludrocortisone (FLORINEF) 0.1 MG tablet [id number] Yes TAKE 1 TABLET BY MOUTH AS DIRECTED.  Patient taking differently: Take 0.1 mg by mouth daily.   Lillard Anes, MD Taking Active   fluticasone [facility]) 50 MCG/ACT nasal spray [id number] Yes Place 2 sprays into both nostrils daily as needed (congestion). Lillard Anes, MD Taking Active Self  fluticasone furoate-vilanterol (BREO ELLIPTA) 100-25 MCG/INH AEPB [id number] Yes Inhale 1 puff into the lungs daily in the afternoon. Lillard Anes, MD Taking  Active Self  furosemide (LASIX) 40 MG tablet [id number] Yes TAKE ONE TABLET BY MOUTH EVERY MORNING MAY take 0.5 tablet in THE afternoon IF needed  Patient taking differently: Take 20 mg by mouth daily as needed for fluid.   Lillard Anes, MD Taking Active   gabapentin (NEURONTIN) 800 MG tablet [id number] Yes Take 1 tablet (800 mg total) by mouth 3 (three) times daily. Patient usually takes twice a day  Patient taking differently: Take 800 mg by mouth 2 (two) times daily as needed (pain).   Lillard Anes, MD Taking Active   ipratropium (ATROVENT) 0.02 % nebulizer solution [id number] Yes Take 2.5 mLs (0.5 mg total) by nebulization every 4 (four) hours as needed for wheezing or shortness of breath. Lillard Anes, MD Taking Active Self  levocetirizine Harlow Ohms) 5 MG tablet [id number] Yes Take 5 mg by mouth daily as needed for allergies. [provider] Taking Active Self  LINZESS 145 MCG CAPS capsule [id number] Yes Take 145 mcg by mouth daily as needed (indigestion). [provider] Taking Active Self  Multiple Vitamin (MULTIVITAMIN WITH MINERALS) TABS tablet [id number] Yes Take 1 tablet by mouth daily. Eugenie Filler, MD Taking Active   nitroGLYCERIN (NITROSTAT) 0.4 MG SL tablet [id number] Yes Place 1 tablet (0.4 mg total) under the tongue every 5 (five) minutes as needed for chest pain. Lillard Anes, MD Taking Active Self  ondansetron [facility]) 4 MG tablet [id number] Yes Take 1 tablet (4 mg total) by mouth every 8 (eight) hours as needed for nausea or vomiting. Lillard Anes, MD Taking Active Self  Oxycodone HCl 10 MG TABS [id number] Yes Take 1 tablet by mouth every 4 (four) hours as needed (pain). [provider] Taking Active Self  ranolazine (RANEXA) 500 MG 12 hr tablet [id number] Yes TAKE 1 TABLET(500 MG) BY MOUTH TWICE DAILY Eugenie Filler, MD Taking Active   rosuvastatin (CRESTOR) 20 MG tablet [id number] Yes TAKE ONE TABLET BY  MOUTH EVERY MORNING Lillard Anes, MD Taking Active Self  sertraline (ZOLOFT) 100 MG tablet [id number] Yes Take 0.5 tablets (50 mg total) by mouth daily. Lillard Anes, MD Taking Active Self  topiramate (TOPAMAX) 50 MG tablet [id number] Yes Take 2 tablets (100 mg total) by mouth at bedtime. Lillard Anes, MD Taking Active Self  UBRELVY 100 MG TABS [id number]  Yes TAKE 1 TABLET BY MOUTH BY MOUTH AS NEEDED( MAY REPEAT 1 TABLET AFTER 2 HOURS IF NEEDED, MAXIMUM 2 TABLETS IN 24 HOURS) Pieter Partridge, DO Taking Active           Patient Active Problem List   Diagnosis Date Noted  . Diverticulitis   . Nausea & vomiting   . Mixed incontinence 10/20/2020  . Malnutrition of moderate degree ([place]) 10/20/2020  . Abdominal pain 10/17/2020  . Sepsis ([place]) 10/17/2020  . Acute diverticulitis 10/17/2020  . 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 Mesenteric ischemia ([place]) 10/16/2020  . Thyroid disease   . PTSD (post-traumatic stress disorder)   . 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[place]) 07/13/2020  . Dehydration 05/17/2020  . Hypotension 05/17/2020  . Persistent vomiting 05/17/2020  . Diarrhea 05/17/2020  . Cystitis 05/17/2020  . BMI 26.0-26.9,adult 03/29/2020  . Obstructive chronic bronchitis with exacerbation ([place]) 10/25/2019  . Admission for long-term opiate analgesic use 10/24/2019  . Presence of left artificial hip joint 10/24/2019  . Senile osteoporosis 10/24/2019  . Chronic hypoxemic respiratory failure ([place]) 10/24/2019  . Other spondylosis with radiculopathy, lumbar region 10/24/2019  . Major depressive disorder, single episode, moderate ([place]) 10/24/2019  . Migraine without aura with status migrainosus 10/24/2019  . 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 Chronic pain of both knees 12/21/2018  . Chronic systolic congestive heart failure, NYHA class 2 ([place]) 06/19/2017  . Dilated cardiomyopathy ([place]) 06/19/2017  . 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 Arthritis of right hip 05/29/2016  . Coronary artery disease involving native coronary artery of native heart without angina pectoris 05/31/2015  . Dyslipidemia 05/31/2015  . Essential hypertension 05/31/2015  . Chronic narcotic use 03/23/2015  . GERD (gastroesophageal reflux disease)   . 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 COPD (chronic obstructive pulmonary disease) ([place])   . Cardiomyopathy ([place])   . Chronic back pain     Immunization History  Administered Date(s) Administered  . Influenza Inj Mdck Quad With Preservative 07/21/2017  . Influenza-Unspecified 06/26/2013, 05/26/2018  . Pneumococcal Polysaccharide-23 12/20/2019, 06/02/2020    Conditions to be addressed/monitored:  Hypertension and Hyperlipidemia  Care Plan : ccm pharmacy care plan  Updates made by Burnice Logan, RPH since 10/31/2020 12:00 AM    Problem: chf and htn   Priority: High  Onset Date: 10/31/2020    Long-Range Goal: Disease Management   Start Date: 10/31/2020  Expected End Date: 10/31/2021  This Visit's Progress: On track  Priority: High  Note:    Current Barriers:  . Unable to self administer medications as prescribed  Pharmacist Clinical Goal(s):  Marland Kitchen Over the next 90 days, patient will achieve ability to self administer medications as prescribed through use of pharmacy delivery services as evidenced by patient report through collaboration with PharmD and provider.   Interventions: . 1:1 collaboration with Lillard Anes, MD regarding development and update of comprehensive plan of care as evidenced by provider attestation and co-signature . Inter-disciplinary care team collaboration (see longitudinal plan of care) . Comprehensive medication review performed; medication list updated in electronic medical record  Hypertension (BP goal <130/80) -Controlled -Current  treatment: . Carvedilol 3.125 mg bid  -Medications previously tried: n/a  -Current home readings: not checking since coming home from hospital -Current dietary habits: eating pretzels, potato chips and tomato soup since coming home from hospital -Current exercise habits: beginning PT this week after hospitaliztion -Denies hypotensive/hypertensive symptoms -  Educated on BP goals and benefits of medications for prevention of heart attack, stroke and kidney damage; Daily salt intake goal < 2300 mg; Exercise goal of 150 minutes per week; Importance of home blood pressure monitoring; Symptoms of hypotension and importance of maintaining adequate hydration; -Counseled to monitor BP at home daily, document, and provide log at future appointments -Counseled on diet and exercise extensively Recommended to continue current medication Recommended beginning to check bp again now that she's home.   Heart Failure (Goal: manage symptoms and prevent exacerbations) -Controlled -Last ejection fraction: 30-35% (Date: 2018) -HF type: Systolic -NYHA Class: II (slight limitation of activity) -Current treatment: . Carvedilol 3.125 mg bid . Entresto 24-26 mg bid . Furosemide 20 mg daily prn  . Ranolazine 500 mg bid  -Medications previously tried: n/a  -Current home BP/HR readings: not checking since coming home from hospital -Current dietary habits: pretzels, tomato soup and potato chips -Current exercise habits: beginning PT this week -Educated on Benefits of medications for managing symptoms and prolonging life Importance of weighing daily; if you gain more than 3 pounds in one day or 5 pounds in one week,    Importance of blood pressure control -Counseled on diet and exercise extensively Recommended to continue current medication Recommended beginning to check blood pressure daily  Collaborated with receptionist to [facility] hospital follow-up visit.       Patient Goals/Self-Care  Activities . Over the next 90 days, patient will:  - take medications as prescribed focus on medication adherence by using pill box check blood pressure daily, document, and provide at future appointments engage in dietary modifications by limiting salt in diet  Follow Up Plan: Telephone follow up appointment with care management team member scheduled for: 05/01/2021      Medication Assistance: None required.  Patient affirms current coverage meets needs.  Patient's preferred pharmacy is:  [facility] - [place], [place] - [address] [place] [address] Phone: [phone] Fax: [address], [place] [address] Phone: [phone] Fax: [phone]  Uses pill box? Yes Pt endorses good compliance  We discussed: Benefits of medication synchronization, packaging and delivery as well as enhanced pharmacist oversight with [facility]. Patient decided to: Utilize [facility] pharmacy for medication synchronization, packaging and delivery  Care Plan and Follow Up Patient Decision:  Patient agrees to Care Plan and Follow-up.  Plan: Telephone follow up appointment with care management team member scheduled for:  04/2021

## 2020-11-01 ENCOUNTER — Other Ambulatory Visit: Payer: 59

## 2020-11-08 ENCOUNTER — Ambulatory Visit (INDEPENDENT_AMBULATORY_CARE_PROVIDER_SITE_OTHER): Payer: 59

## 2020-11-08 ENCOUNTER — Other Ambulatory Visit: Payer: Self-pay

## 2020-11-08 DIAGNOSIS — Z95810 Presence of automatic (implantable) cardiac defibrillator: Secondary | ICD-10-CM | POA: Diagnosis not present

## 2020-11-08 DIAGNOSIS — I10 Essential (primary) hypertension: Secondary | ICD-10-CM | POA: Diagnosis not present

## 2020-11-08 DIAGNOSIS — I420 Dilated cardiomyopathy: Secondary | ICD-10-CM

## 2020-11-09 ENCOUNTER — Encounter: Payer: Self-pay | Admitting: Legal Medicine

## 2020-11-09 ENCOUNTER — Ambulatory Visit (INDEPENDENT_AMBULATORY_CARE_PROVIDER_SITE_OTHER): Payer: 59 | Admitting: Legal Medicine

## 2020-11-09 ENCOUNTER — Ambulatory Visit (INDEPENDENT_AMBULATORY_CARE_PROVIDER_SITE_OTHER): Payer: 59

## 2020-11-09 ENCOUNTER — Inpatient Hospital Stay: Payer: 59 | Admitting: Legal Medicine

## 2020-11-09 VITALS — BP 107/60 | HR 92 | Temp 98.0°F | Resp 16 | Ht 60.0 in | Wt 126.0 lb

## 2020-11-09 DIAGNOSIS — A419 Sepsis, unspecified organism: Secondary | ICD-10-CM

## 2020-11-09 DIAGNOSIS — I739 Peripheral vascular disease, unspecified: Secondary | ICD-10-CM | POA: Diagnosis not present

## 2020-11-09 DIAGNOSIS — F321 Major depressive disorder, single episode, moderate: Secondary | ICD-10-CM | POA: Diagnosis not present

## 2020-11-09 DIAGNOSIS — I10 Essential (primary) hypertension: Secondary | ICD-10-CM

## 2020-11-09 DIAGNOSIS — J9611 Chronic respiratory failure with hypoxia: Secondary | ICD-10-CM | POA: Diagnosis not present

## 2020-11-09 DIAGNOSIS — E440 Moderate protein-calorie malnutrition: Secondary | ICD-10-CM

## 2020-11-09 DIAGNOSIS — E2740 Unspecified adrenocortical insufficiency: Secondary | ICD-10-CM

## 2020-11-09 LAB — ECHOCARDIOGRAM COMPLETE
Area-P 1/2: 5.97 cm2
Calc EF: 38.9 %
S' Lateral: 4.5 cm
Single Plane A2C EF: 35 %
Single Plane A4C EF: 37.4 %

## 2020-11-09 NOTE — Progress Notes (Signed)
Subjective:  Patient ID: [name], female    DOB: 12/06/1955  Age: 65 y.o. MRN: [id number]  Chief Complaint  Patient presents with  . Transitions Of Care    HPI: transition of care and reconciliation of medicines. Patient was admitted to [facility] with sepsis and diveericular fistulas on 10/17/2020.  Blood cultures grew ou Staph epidermitis and she was treated with vancomycin.  She was rehydrated and discharged on 10/29/2020.  She can eat soft meals yet. regualr food causes vomiting.  " CT abdomen and pelvis 10/25/2020), with no acute findings within the abdomen or pelvis. No bowel obstruction. Colonic diverticulosis without evidence of acute diverticulitis. 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 Bowel wall thickening appreciated in this area on earlier CT has resolved. No evidence of acute enteritis, colitis or diverticulitis. Trace free fluid in the right lower pelvis. No abscess collection is seen. No free intraperitoneal air. Anasarca." Current Outpatient Medications on File Prior to Visit  Medication Sig Dispense Refill  . acetaminophen (TYLENOL) 325 MG tablet Take 2 tablets (650 mg total) by mouth every 6 (six) hours as needed for mild pain (or Fever >/= 101).    Marland Kitchen aspirin 81 MG tablet Take 81 mg by mouth daily.    . Buprenorphine HCl (BELBUCA) 900 MCG FILM Place 900 mcg inside cheek every 12 (twelve) hours.    . carvedilol (COREG) 3.125 MG tablet TAKE 1 TABLET BY MOUTH TWICE DAILY 180 tablet 2  . celecoxib (CELEBREX) 200 MG capsule TAKE ONE CAPSULE BY MOUTH EVERY MORNING 90 capsule 2  . cyclobenzaprine (FLEXERIL) 10 MG tablet Take 10 mg by mouth 3 (three) times daily as needed for muscle spasms.    Marland Kitchen denosumab (PROLIA) 60 MG/ML SOSY injection Inject 60 mg into the skin every 6 (six) months. 180 mL 2  .  diazepam (VALIUM) 5 MG tablet Take 1 tablet (5 mg total) by mouth 3 (three) times daily as needed. (Patient taking differently: Take 5 mg by mouth 3 (three) times daily as needed for anxiety.) 30 tablet 3  . diphenoxylate-atropine (LOMOTIL) 2.5-0.025 MG tablet Take 1 tablet by mouth 4 (four) times daily as needed for diarrhea or loose stools. 30 tablet 0  . ENTRESTO 24-26 MG TAKE ONE TABLET BY MOUTH EVERY MORNING and TAKE ONE TABLET BY MOUTH EVERY EVENING 180 tablet 2  . EPINEPHRINE 0.3 mg/0.3 mL IJ SOAJ injection Inject 0.3 mLs (0.3 mg total) into the muscle as needed for anaphylaxis. 1 each 2  . esomeprazole (NEXIUM) 20 MG capsule Take 1 capsule (20 mg total) by mouth daily. 90 capsule 2  . fludrocortisone (FLORINEF) 0.1 MG tablet TAKE 1 TABLET BY MOUTH AS DIRECTED. (Patient taking differently: Take 0.1 mg by mouth daily.) 30 tablet 3  . fluticasone (FLONASE) 50 MCG/ACT nasal spray Place 2 sprays into both nostrils daily as needed (congestion). 18 mL 2  . fluticasone furoate-vilanterol (BREO ELLIPTA) 100-25 MCG/INH AEPB Inhale 1 puff into the lungs daily in the afternoon. 90 each 1  . furosemide (LASIX) 40 MG tablet TAKE ONE TABLET BY MOUTH EVERY MORNING MAY take 0.5 tablet in THE afternoon IF needed (Patient taking differently: Take 20 mg by mouth daily as needed for fluid.) 90 tablet 2  . gabapentin (NEURONTIN) 800 MG tablet Take 1 tablet (800 mg total) by mouth 3 (three) times daily. Patient usually takes twice a day (  Patient taking differently: Take 800 mg by mouth 2 (two) times daily as needed (pain).) 270 tablet 2  . ipratropium (ATROVENT) 0.02 % nebulizer solution Take 2.5 mLs (0.5 mg total) by nebulization every 4 (four) hours as needed for wheezing or shortness of breath. 150 mL 2  . levocetirizine (XYZAL) 5 MG tablet Take 5 mg by mouth daily as needed for allergies.    Marland Kitchen LINZESS 145 MCG CAPS capsule Take 145 mcg by mouth daily as needed (indigestion).    . Multiple Vitamin (MULTIVITAMIN WITH  MINERALS) TABS tablet Take 1 tablet by mouth daily.    . nitroGLYCERIN (NITROSTAT) 0.4 MG SL tablet Place 1 tablet (0.4 mg total) under the tongue every 5 (five) minutes as needed for chest pain. 25 tablet 6  . ondansetron (ZOFRAN) 4 MG tablet Take 1 tablet (4 mg total) by mouth every 8 (eight) hours as needed for nausea or vomiting. 20 tablet 0  . Oxycodone HCl 10 MG TABS Take 1 tablet by mouth every 4 (four) hours as needed (pain).    . ranolazine (RANEXA) 500 MG 12 hr tablet TAKE 1 TABLET(500 MG) BY MOUTH TWICE DAILY 180 tablet 2  . rosuvastatin (CRESTOR) 20 MG tablet TAKE ONE TABLET BY MOUTH EVERY MORNING 90 tablet 2  . sertraline (ZOLOFT) 100 MG tablet Take 0.5 tablets (50 mg total) by mouth daily. 90 tablet 2  . topiramate (TOPAMAX) 50 MG tablet Take 2 tablets (100 mg total) by mouth at bedtime. 240 tablet 2  . UBRELVY 100 MG TABS TAKE 1 TABLET BY MOUTH BY MOUTH AS NEEDED( MAY REPEAT 1 TABLET AFTER 2 HOURS IF NEEDED, MAXIMUM 2 TABLETS IN 24 HOURS) 16 tablet 3   No current facility-administered medications on file prior to visit.   Past Medical History:  Diagnosis Date  . Admission for long-term opiate analgesic use 10/24/2019  . Arthritis of right hip 05/29/2016   Formatting of this note might be different from the original. Added automatically from request for surgery [phone]  . BMI 26.0-26.9,adult 03/29/2020  . Cardiomyopathy [facility])    Overview:  Ejection fraction 45% in 2015 Ejection fraction 30 to 35% in November 2018  . Chronic back pain   . Chronic hypoxemic respiratory failure ([place]) 10/24/2019  . Chronic narcotic use 03/23/2015  . Chronic pain of both knees 12/21/2018   Added automatically from request for surgery [id number]  Formatting of this note might be different from the original. Added automatically from request for surgery [phone]  . Chronic systolic congestive heart failure, NYHA class 2 ([place]) 06/19/2017  . COPD (chronic obstructive pulmonary disease) ([place])   . Coronary artery  disease involving native coronary artery of native heart without angina pectoris 05/31/2015   Overview:  Abnormal stress test in fall of 2016, cardiac catheterization showed normal coronaries.  . Cystitis 05/17/2020  . Dehydration 05/17/2020  . Diarrhea 05/17/2020  . Dilated cardiomyopathy ([place]) 06/19/2017  . 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 Dyslipidemia 05/31/2015  . Essential hypertension 05/31/2015  . GERD (gastroesophageal reflux disease)   . 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[place]) 07/13/2020  . Hypotension 05/17/2020  . ICD (implantable cardiac defibrillator) in place 2012  . 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 Ileus following gastrointestinal surgery ([place]) 03/26/2015  . Major depressive disorder, single episode, moderate ([place]) 10/24/2019  . Migraine without aura with status migrainosus 10/24/2019  . Nausea alone 04/19/2014  . Obstructive chronic bronchitis with exacerbation ([place]) 10/25/2019  . Other  spondylosis with radiculopathy, lumbar region 10/24/2019  . Persistent vomiting 05/17/2020  . Presence of left artificial hip joint 10/24/2019  . PTSD (post-traumatic stress disorder)   . 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 Senile osteoporosis 10/24/2019  . Thyroid disease    Past Surgical History:  Procedure Laterality Date  . APPENDECTOMY    . CARDIAC CATHETERIZATION    . CARPAL TUNNEL RELEASE    . CESAREAN SECTION    . CHOLECYSTECTOMY    . CORONARY ANGIOPLASTY    . HAND SURGERY    . ICD GENERATOR CHANGEOUT N/A 05/21/2019   Procedure: ICD GENERATOR CHANGEOUT;  Surgeon: Constance Haw, MD;  Location: [place] [facility];  Service: Cardiovascular;  Laterality: N/A;  . ICD IMPLANT     [employer]tronic  . intestinal blockage 2011    . LAPAROSCOPIC ASSISTED VENTRAL HERNIA REPAIR N/A 03/23/2015   Procedure: LAPAROSCOPIC VENTRAL WALL HERNIA REPAIR;   Surgeon: Michael Boston, MD;  Location: [facility];  Service: General;  Laterality: N/A;  With MESH  . LAPAROSCOPIC LYSIS OF ADHESIONS N/A 03/23/2015   Procedure: LAPAROSCOPIC LYSIS OF ADHESIONS;  Surgeon: Michael Boston, MD;  Location: [facility];  Service: General;  Laterality: N/A;  . NECK SURGERY     fused  . TONSILLECTOMY    . ULNAR NERVE TRANSPOSITION  01/23/2012   Procedure: ULNAR NERVE DECOMPRESSION/TRANSPOSITION;  Surgeon: Ophelia Charter, MD;  Location: [place] [facility];  Service: Neurosurgery;  Laterality: Left;  LEFT ulnar nerve decompression    Family History  Problem Relation Age of Onset  . Heart attack Other   . Cancer Other   . Heart failure Other   . Hypertension Mother   . Heart attack Mother   . Alcohol abuse Father   . Hypertension Father   . Heart attack Father   . Anesthesia problems Neg Hx   . Hypotension Neg Hx   . Malignant hyperthermia Neg Hx   . Pseudochol deficiency Neg Hx    Social History   Socioeconomic History  . Marital status: Single    Spouse name: Not on file  . Number of children: 3  . Years of education: Not on file  . Highest education level: Not on file  Occupational History  . Occupation: disabled  Tobacco Use  . Smoking status: Former Smoker    Packs/day: 0.50    Years: 30.00    Pack years: 15.00    Types: Cigarettes    Quit date: 03/2020    Years since quitting: 0.6  . Smokeless tobacco: Never Used  Vaping Use  . Vaping Use: Never used  Substance and Sexual Activity  . Alcohol use: Yes    Alcohol/week: 2.0 standard drinks    Types: 1 Cans of beer, 1 Standard drinks or equivalent per week    Comment: seldom  . Drug use: No  . Sexual activity: Not Currently  Other Topics Concern  . Not on file  Social History Narrative   One level home with boyfriend   Caffeine - coffee 1-2 cups/day; Green tea 4-5 bottles a day   Exercise - some    Right handed      Social Determinants of Health   Financial Resource Strain: Not on file  Food  Insecurity: No Food Insecurity  . Worried About Charity fundraiser in the Last Year: Never true  . Ran Out of Food in the Last Year: Never true  Transportation Needs: Not on  file  Physical Activity: Not on file  Stress: Not on file  Social Connections: Not on file    Review of Systems  Constitutional: Positive for fatigue. Negative for activity change and appetite change.       Sallow complexion  HENT: Negative.   Eyes: Negative for visual disturbance.  Respiratory: Negative for chest tightness and shortness of breath.   Cardiovascular: Negative for chest pain, palpitations and leg swelling.  Gastrointestinal: Positive for abdominal pain.  Endocrine: Negative for polyuria.  Genitourinary: Negative.   Musculoskeletal: Negative for arthralgias and back pain.  Skin: Negative.   Neurological: Positive for weakness.       In wheel chair     Objective:  BP 107/60   Pulse 92   Temp 98 F (36.7 C)   Resp 16   Ht 5' (1.524 m)   Wt 126 lb (57.2 kg)   SpO2 96%   BMI 24.61 kg/m   BP/Weight 11/09/2020 10/02/2534 01/27/4033  Systolic BP 742 99 -  Diastolic BP 60 64 -  Wt. (Lbs) 126 - 142.86  BMI 24.61 - -    Physical Exam Vitals reviewed.  Constitutional:      Appearance: Normal appearance.  HENT:     Head: Normocephalic.     Right Ear: Tympanic membrane normal.     Left Ear: Tympanic membrane normal.     Mouth/Throat:     Mouth: Mucous membranes are moist.     Pharynx: Oropharynx is clear.  Eyes:     Extraocular Movements: Extraocular movements intact.     Conjunctiva/sclera: Conjunctivae normal.     Pupils: Pupils are equal, round, and reactive to light.  Cardiovascular:     Rate and Rhythm: Normal rate and regular rhythm.     Pulses: Normal pulses.     Heart sounds: Normal heart sounds. No murmur heard. No gallop.   Pulmonary:     Effort: Pulmonary effort is normal. No respiratory distress.     Breath sounds: Normal breath sounds. No rales.  Abdominal:      General: Abdomen is flat. Bowel sounds are normal.     Palpations: Abdomen is soft.     Tenderness: There is abdominal tenderness (diffuse).  Musculoskeletal:        General: Normal range of motion.     Cervical back: Normal range of motion.  Skin:    General: Skin is warm.     Capillary Refill: Capillary refill takes less than 2 seconds.  Neurological:     General: No focal deficit present.     Mental Status: She is alert and oriented to person, place, and time.       Lab Results  Component Value Date   WBC 10.3 10/29/2020   HGB 8.8 (L) 10/29/2020   HCT 27.1 (L) 10/29/2020   PLT 466 (H) 10/29/2020   GLUCOSE 83 10/29/2020   CHOL 141 07/05/2020   TRIG 127 07/05/2020   HDL 46 07/05/2020   LDLCALC 72 07/05/2020   ALT 11 10/17/2020   AST 14 (L) 10/17/2020   NA 141 10/29/2020   K 3.4 (L) 10/29/2020   CL 113 (H) 10/29/2020   CREATININE 0.63 10/29/2020   BUN 5 (L) 10/29/2020   CO2 22 10/29/2020   TSH 0.987 07/05/2020   INR 1.0 10/18/2020      Assessment & Plan:   1. Malnutrition of moderate degree (HCC) Supplement nutrition with protein/calorie supplement with meals to improve nutritional status. She keeps following behind with hospitalizations  2. Major depressive disorder, single episode, moderate ([place]) Patient's depression is partially controlled  with zoloft.   Anhedonia better.  PHQ 9 was not performed score o. An individual care plan was established or reinforced today.  The patient's disease status was assessed using clinical findings on exam, labs, and or other diagnostic testing to determine patient's success in meeting treatment goals based on disease specific evidence-based guidelines and found to be improving Recommendations include stay on medicines  3. Chronic hypoxemic respiratory failure ([place]) Patient is on chronic O2 via canula  4. 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[place]) Patient is on florinef.  5. Sepsis without acute organ dysfunction, due to unspecified organism  ([place]) - C-reactive protein patient admitted to hospital for sepsis from diverticulitis   6. Hypomagnesemia - Magnesium Low magnesium in hospital, we will recheck  7. Essential hypertension - CBC with Differential/Platelet - Comprehensive metabolic panel An individual hypertension care plan was established and reinforced today.  The patient's status was assessed using clinical findings on exam and labs or diagnostic tests. The patient's success at meeting treatment goals on disease specific evidence-based guidelines and found to be well controlled. SELF MANAGEMENT: The patient and I together assessed ways to personally work towards obtaining the recommended goals. RECOMMENDATIONS: avoid decongestants found in common cold remedies, decrease consumption of alcohol, perform routine monitoring of BP with home BP cuff, exercise, reduction of dietary salt, take medicines as prescribed, try not to miss doses and quit smoking.  Regular exercise and maintaining a healthy weight is needed.  Stress reduction may help. A CLINICAL SUMMARY including written plan identify barriers to care unique to individual due to social or financial issues.  We attempt to mutually creat solutions for individual and family understanding.      Orders Placed This Encounter  Procedures  . CBC with Differential/Platelet  . Comprehensive metabolic panel  . Magnesium  . C-reactive protein      I spent 30 minutes dedicated to the care of this patient on the date of this encounter to include face-to-face time with the patient, as well as:   Follow-up: Return in about 3 months (around 02/09/2021) for chronic.  An After Visit Summary was printed and given to the patient.  Reinaldo Meeker, MD [facility] [phone]

## 2020-11-09 NOTE — Progress Notes (Signed)
Bilateral lower extremity arterial duplex exam performed.  Jimmy Deshayla Empson RDCS, RVCT

## 2020-11-10 ENCOUNTER — Telehealth: Payer: Self-pay | Admitting: Emergency Medicine

## 2020-11-10 LAB — COMPREHENSIVE METABOLIC PANEL
ALT: 12 IU/L (ref 0–32)
AST: 13 IU/L (ref 0–40)
Albumin/Globulin Ratio: 1.6 (ref 1.2–2.2)
Albumin: 3.6 g/dL — ABNORMAL LOW (ref 3.8–4.8)
Alkaline Phosphatase: 110 IU/L (ref 44–121)
BUN/Creatinine Ratio: 11 — ABNORMAL LOW (ref 12–28)
BUN: 11 mg/dL (ref 8–27)
Bilirubin Total: <0.2 mg/dL (ref 0.0–1.2)
CO2: 18 mmol/L — ABNORMAL LOW (ref 20–29)
Calcium: 8.8 mg/dL (ref 8.7–10.3)
Chloride: 110 mmol/L — ABNORMAL HIGH (ref 96–106)
Creatinine, Ser: 0.96 mg/dL (ref 0.57–1.00)
Globulin, Total: 2.3 g/dL (ref 1.5–4.5)
Glucose: 102 mg/dL — ABNORMAL HIGH (ref 65–99)
Potassium: 4.2 mmol/L (ref 3.5–5.2)
Sodium: 142 mmol/L (ref 134–144)
Total Protein: 5.9 g/dL — ABNORMAL LOW (ref 6.0–8.5)
eGFR: 65 mL/min/{1.73_m2} (ref >59)

## 2020-11-10 LAB — CBC WITH DIFFERENTIAL/PLATELET
Basophils Absolute: 0.1 10*3/uL (ref 0.0–0.2)
Basos: 1 %
EOS (ABSOLUTE): 0.5 10*3/uL — ABNORMAL HIGH (ref 0.0–0.4)
Eos: 6 %
Hematocrit: 31.2 % — ABNORMAL LOW (ref 34.0–46.6)
Hemoglobin: 10.1 g/dL — ABNORMAL LOW (ref 11.1–15.9)
Immature Grans (Abs): 0 10*3/uL (ref 0.0–0.1)
Immature Granulocytes: 0 %
Lymphocytes Absolute: 1.7 10*3/uL (ref 0.7–3.1)
Lymphs: 20 %
MCH: 30.7 pg (ref 26.6–33.0)
MCHC: 32.4 g/dL (ref 31.5–35.7)
MCV: 95 fL (ref 79–97)
Monocytes Absolute: 0.7 10*3/uL (ref 0.1–0.9)
Monocytes: 8 %
Neutrophils Absolute: 5.5 10*3/uL (ref 1.4–7.0)
Neutrophils: 65 %
Platelets: 336 10*3/uL (ref 150–450)
RBC: 3.29 x10E6/uL — ABNORMAL LOW (ref 3.77–5.28)
RDW: 12.9 % (ref 11.7–15.4)
WBC: 8.5 10*3/uL (ref 3.4–10.8)

## 2020-11-10 LAB — MAGNESIUM: Magnesium: 2 mg/dL (ref 1.6–2.3)

## 2020-11-10 LAB — C-REACTIVE PROTEIN: CRP: 16 mg/L — ABNORMAL HIGH (ref 0–10)

## 2020-11-10 NOTE — Progress Notes (Signed)
Anemia has improved with hemoglobin up to 10.1 from 8.8, glucose 102, kidney tests normal, potassium 4.2 norma, liver tests normal, Magnesium 2.0 OK, CRP down to 16 lp

## 2020-11-10 NOTE — Telephone Encounter (Signed)
Called patient informed her of results. She is scheduled to see Dr. Agustin Cree in May. Will check to see if he wants to see her sooner.

## 2020-11-14 DIAGNOSIS — I110 Hypertensive heart disease with heart failure: Secondary | ICD-10-CM

## 2020-11-14 DIAGNOSIS — I5022 Chronic systolic (congestive) heart failure: Secondary | ICD-10-CM

## 2020-11-14 DIAGNOSIS — M4726 Other spondylosis with radiculopathy, lumbar region: Secondary | ICD-10-CM

## 2020-11-14 DIAGNOSIS — G43001 Migraine without aura, not intractable, with status migrainosus: Secondary | ICD-10-CM

## 2020-11-14 DIAGNOSIS — I2510 Atherosclerotic heart disease of native coronary artery without angina pectoris: Secondary | ICD-10-CM

## 2020-11-14 DIAGNOSIS — K632 Fistula of intestine: Secondary | ICD-10-CM | POA: Diagnosis not present

## 2020-11-14 DIAGNOSIS — J449 Chronic obstructive pulmonary disease, unspecified: Secondary | ICD-10-CM | POA: Diagnosis not present

## 2020-11-14 DIAGNOSIS — J9611 Chronic respiratory failure with hypoxia: Secondary | ICD-10-CM

## 2020-11-14 DIAGNOSIS — G253 Myoclonus: Secondary | ICD-10-CM

## 2020-11-14 DIAGNOSIS — A419 Sepsis, unspecified organism: Secondary | ICD-10-CM | POA: Diagnosis not present

## 2020-11-14 DIAGNOSIS — M159 Polyosteoarthritis, unspecified: Secondary | ICD-10-CM

## 2020-11-14 DIAGNOSIS — K5792 Diverticulitis of intestine, part unspecified, without perforation or abscess without bleeding: Secondary | ICD-10-CM | POA: Diagnosis not present

## 2020-11-15 ENCOUNTER — Telehealth: Payer: Self-pay

## 2020-11-15 NOTE — Telephone Encounter (Signed)
-----   Message from Park Liter, MD sent at 11/10/2020 12:23 PM EDT ----- Diminished ejection fraction 30 to 35%, mild to moderate MR, will talk about this during next visit

## 2020-11-15 NOTE — Telephone Encounter (Signed)
Patient notified of results and verbalized understanding.  

## 2020-11-22 ENCOUNTER — Other Ambulatory Visit: Payer: Self-pay | Admitting: Legal Medicine

## 2020-11-22 DIAGNOSIS — R2689 Other abnormalities of gait and mobility: Secondary | ICD-10-CM

## 2020-11-22 HISTORY — DX: Other abnormalities of gait and mobility: R26.89

## 2020-11-24 ENCOUNTER — Telehealth: Payer: Self-pay

## 2020-11-24 NOTE — Progress Notes (Signed)
Chronic Care Management Pharmacy Assistant   Name: [name]  MRN: [id number] DOB: Feb 17, 1956   Reason for Encounter:  Medication coordination for [facility] delivery  Medications: Outpatient Encounter Medications as of 11/24/2020  Medication Sig  . acetaminophen (TYLENOL) 325 MG tablet Take 2 tablets (650 mg total) by mouth every 6 (six) hours as needed for mild pain (or Fever >/= 101).  Marland Kitchen aspirin 81 MG tablet Take 81 mg by mouth daily.  . Buprenorphine HCl (BELBUCA) 900 MCG FILM Place 900 mcg inside cheek every 12 (twelve) hours.  . carvedilol (COREG) 3.125 MG tablet TAKE 1 TABLET BY MOUTH TWICE DAILY  . celecoxib (CELEBREX) 200 MG capsule TAKE ONE CAPSULE BY MOUTH EVERY MORNING  . cyclobenzaprine (FLEXERIL) 10 MG tablet Take 10 mg by mouth 3 (three) times daily as needed for muscle spasms.  Marland Kitchen denosumab (PROLIA) 60 MG/ML SOSY injection Inject 60 mg into the skin every 6 (six) months.  . diazepam (VALIUM) 5 MG tablet Take 1 tablet (5 mg total) by mouth 3 (three) times daily as needed. (Patient taking differently: Take 5 mg by mouth 3 (three) times daily as needed for anxiety.)  . diphenoxylate-atropine (LOMOTIL) 2.5-0.025 MG tablet Take 1 tablet by mouth 4 (four) times daily as needed for diarrhea or loose stools.  Marland Kitchen ENTRESTO 24-26 MG TAKE ONE TABLET BY MOUTH EVERY MORNING and TAKE ONE TABLET BY MOUTH EVERY EVENING  . EPINEPHRINE 0.3 mg/0.3 mL IJ SOAJ injection Inject 0.3 mLs (0.3 mg total) into the muscle as needed for anaphylaxis.  Marland Kitchen esomeprazole (NEXIUM) 20 MG capsule Take 1 capsule (20 mg total) by mouth daily.  . fludrocortisone (FLORINEF) 0.1 MG tablet TAKE 1 TABLET BY MOUTH AS DIRECTED. (Patient taking differently: Take 0.1 mg by mouth daily.)  . fluticasone (FLONASE) 50 MCG/ACT nasal spray Place 2 sprays into both nostrils daily as needed (congestion).  . fluticasone furoate-vilanterol (BREO ELLIPTA) 100-25 MCG/INH AEPB Inhale 1 puff into the lungs daily in the afternoon.  .  furosemide (LASIX) 40 MG tablet TAKE ONE TABLET BY MOUTH EVERY MORNING MAY take 0.5 tablet in THE afternoon IF needed (Patient taking differently: Take 20 mg by mouth daily as needed for fluid.)  . gabapentin (NEURONTIN) 800 MG tablet Take 1 tablet (800 mg total) by mouth 3 (three) times daily. Patient usually takes twice a day (Patient taking differently: Take 800 mg by mouth 2 (two) times daily as needed (pain).)  . ipratropium (ATROVENT) 0.02 % nebulizer solution Take 2.5 mLs (0.5 mg total) by nebulization every 4 (four) hours as needed for wheezing or shortness of breath.  . levocetirizine (XYZAL) 5 MG tablet Take 5 mg by mouth daily as needed for allergies.  Marland Kitchen LINZESS 145 MCG CAPS capsule Take 145 mcg by mouth daily as needed (indigestion).  . Multiple Vitamin (MULTIVITAMIN WITH MINERALS) TABS tablet Take 1 tablet by mouth daily.  . nitroGLYCERIN (NITROSTAT) 0.4 MG SL tablet Place 1 tablet (0.4 mg total) under the tongue every 5 (five) minutes as needed for chest pain.  Marland Kitchen ondansetron (ZOFRAN) 4 MG tablet Take 1 tablet (4 mg total) by mouth every 8 (eight) hours as needed for nausea or vomiting.  . Oxycodone HCl 10 MG TABS Take 1 tablet by mouth every 4 (four) hours as needed (pain).  . ranolazine (RANEXA) 500 MG 12 hr tablet TAKE 1 TABLET(500 MG) BY MOUTH TWICE DAILY  . rosuvastatin (CRESTOR) 20 MG tablet TAKE ONE TABLET BY MOUTH EVERY MORNING  . sertraline (ZOLOFT)  100 MG tablet Take 0.5 tablets (50 mg total) by mouth daily.  Marland Kitchen topiramate (TOPAMAX) 50 MG tablet Take 2 tablets (100 mg total) by mouth at bedtime.  Marland Kitchen UBRELVY 100 MG TABS TAKE 1 TABLET BY MOUTH BY MOUTH AS NEEDED( MAY REPEAT 1 TABLET AFTER 2 HOURS IF NEEDED, MAXIMUM 2 TABLETS IN 24 HOURS)   No facility-administered encounter medications on file as of 11/24/2020.    Reviewed chart for medication changes ahead of medication coordination call.  No OVs, Consults, or hospital visits since last care coordination call/Pharmacist visit.  (If appropriate, list visit date, provider name)  No medication changes indicated OR if recent visit, treatment plan here.  BP Readings from Last 3 Encounters:  11/09/20 107/60  10/29/20 99/64  10/16/20 (!) 80/58    No results found for: HGBA1C   Patient obtains medications through Vials  90 Days   Last adherence delivery included:  Xyzal 5 mg 1 tablet daily Diazepam 5 mg 1 tablet 3 times a day as needed Topiramate 50 mg 2 tablets at bedtime Esomeprazole 20 mg 1 capsule before breakfast daily Rosuvastatin 20 mg1 tablet in the morning Breo Ellipta 100-25 mcg Celebrex 200 mg  Gabapentin 800 mg  Entresto 24-26 mg  Furosemide 40 mg  Patient declined (meds) last month due to PRN use/additional supply on hand. Epi Pen- PRN use Prolia - Has6 months left Nitroglycerin sub 0.4 mg- PRN use Fluticasone - PRN use Ipratropium Bromide- States "another company fills this for her" Alendronate 70 mg -PCP discontinued Sertraline 50mg - Had enough on hand Linzess 145 mcg- Had enough on hand  Patient is due for next adherence delivery on: No fill needed for April Called patient and reviewed medications and coordinated delivery.  This delivery to include: None   90ds refills on 10-03-20   Patient declined the following medications (meds) due to (reason) Xyzal 5 mg 1 tablet daily Diazepam 5 mg 1 tablet 3 times a day as needed Topiramate 50 mg 2 tablets at bedtime Esomeprazole 20 mg 1 capsule before breakfast daily Rosuvastatin 20 mg1 tablet in the morning Breo Ellipta 100-25 mcg Celebrex 200 mg  Gabapentin 800 mg  Entresto 24-26 mg  Furosemide 40 mg   Patient needs refills for -None  Confirmed delivery date of no fill needed, advised patient that pharmacy will contact them the morning of delivery.  Clarita Leber, [place] Pharmacist Assistant [phone]

## 2020-11-27 ENCOUNTER — Other Ambulatory Visit: Payer: Self-pay

## 2020-11-27 ENCOUNTER — Ambulatory Visit (INDEPENDENT_AMBULATORY_CARE_PROVIDER_SITE_OTHER): Payer: 59 | Admitting: Cardiology

## 2020-11-27 ENCOUNTER — Encounter: Payer: Self-pay | Admitting: Cardiology

## 2020-11-27 DIAGNOSIS — Z95810 Presence of automatic (implantable) cardiac defibrillator: Secondary | ICD-10-CM | POA: Diagnosis not present

## 2020-11-27 NOTE — Progress Notes (Signed)
Electrophysiology Office Note   Date:  11/27/2020   ID:  [name], DOB Nov 14, 1955, MRN [id number]  PCP:  Lillard Anes, MD  Cardiologist: Agustin Cree Primary Electrophysiologist:  Zlatan Hornback Meredith Leeds, MD    No chief complaint on file.    History of Present Illness: [name] is a 65 y.o. female who is being seen today for the evaluation of ischemic cardiomyopathy at the request of Loyal Jacobson. Presenting today for electrophysiology evaluation.    She has a history significant for coronary artery disease status post MI, ischemic cardiomyopathy, PVCs, hypertension, hyperlipidemia.  She is status post Medtronic dual-chamber ICD.  She is status post generator change 05/21/2019.  Today, denies symptoms of palpitations, chest pain, shortness of breath, orthopnea, PND, lower extremity edema, claudication, dizziness, presyncope, syncope, bleeding, or neurologic sequela. The patient is tolerating medications without difficulties.  For the past 2 weeks she has done well.  Unfortunately she did have a hospitalization for a bowel infection and diverticulitis.  Aside from that, she has no cardiac complaints at this time.  She does complain of some leg weakness, but had an ultrasound that showed no significantly blocked vessels.   Past Medical History:  Diagnosis Date  . Admission for long-term opiate analgesic use 10/24/2019  . Arthritis of right hip 05/29/2016   Formatting of this note might be different from the original. Added automatically from request for surgery [phone]  . BMI 26.0-26.9,adult 03/29/2020  . Cardiomyopathy [facility])    Overview:  Ejection fraction 45% in 2015 Ejection fraction 30 to 35% in November 2018  . Chronic back pain   . Chronic hypoxemic respiratory failure ([place]) 10/24/2019  . Chronic narcotic use 03/23/2015  . Chronic pain of both knees 12/21/2018   Added automatically from request for surgery [id number]  Formatting of this note might be different from the  original. Added automatically from request for surgery [id number]  . Chronic systolic congestive heart failure, NYHA class 2 ([place]) 06/19/2017  . COPD (chronic obstructive pulmonary disease) ([place])   . Coronary artery disease involving native coronary artery of native heart without angina pectoris 05/31/2015   Overview:  Abnormal stress test in fall of 2016, cardiac catheterization showed normal coronaries.  . Cystitis 05/17/2020  . Dehydration 05/17/2020  . Diarrhea 05/17/2020  . Dilated cardiomyopathy ([place]) 06/19/2017  . 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 Dyslipidemia 05/31/2015  . Essential hypertension 05/31/2015  . GERD (gastroesophageal reflux disease)   . 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[place]) 07/13/2020  . Hypotension 05/17/2020  . ICD (implantable cardiac defibrillator) in place 2012  . 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 Ileus following gastrointestinal surgery ([place]) 03/26/2015  . Major depressive disorder, single episode, moderate ([place]) 10/24/2019  . Migraine without aura with status migrainosus 10/24/2019  . Nausea alone 04/19/2014  . Obstructive chronic bronchitis with exacerbation ([place]) 10/25/2019  . Other spondylosis with radiculopathy, lumbar region 10/24/2019  . Persistent vomiting 05/17/2020  . Presence of left artificial hip joint 10/24/2019  . PTSD (post-traumatic stress disorder)   . 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 Senile osteoporosis 10/24/2019  . Thyroid disease    Past Surgical History:  Procedure Laterality Date  . APPENDECTOMY    . CARDIAC CATHETERIZATION    . CARPAL TUNNEL RELEASE    . CESAREAN SECTION    . CHOLECYSTECTOMY    . CORONARY ANGIOPLASTY    . HAND SURGERY    .  ICD GENERATOR CHANGEOUT N/A 05/21/2019   Procedure: ICD GENERATOR CHANGEOUT;  Surgeon: Constance Haw, MD;  Location: [facility];  Service:  Cardiovascular;  Laterality: N/A;  . ICD IMPLANT     Medtronic  . intestinal blockage 2011    . LAPAROSCOPIC ASSISTED VENTRAL HERNIA REPAIR N/A 03/23/2015   Procedure: LAPAROSCOPIC VENTRAL WALL HERNIA REPAIR;  Surgeon: Michael Boston, MD;  Location: [facility];  Service: General;  Laterality: N/A;  With MESH  . LAPAROSCOPIC LYSIS OF ADHESIONS N/A 03/23/2015   Procedure: LAPAROSCOPIC LYSIS OF ADHESIONS;  Surgeon: Michael Boston, MD;  Location: [facility];  Service: General;  Laterality: N/A;  . NECK SURGERY     fused  . TONSILLECTOMY    . ULNAR NERVE TRANSPOSITION  01/23/2012   Procedure: ULNAR NERVE DECOMPRESSION/TRANSPOSITION;  Surgeon: Ophelia Charter, MD;  Location: [facility];  Service: Neurosurgery;  Laterality: Left;  LEFT ulnar nerve decompression     Current Outpatient Medications  Medication Sig Dispense Refill  . acetaminophen (TYLENOL) 325 MG tablet Take 2 tablets (650 mg total) by mouth every 6 (six) hours as needed for mild pain (or Fever >/= 101).    Marland Kitchen aspirin 81 MG tablet Take 81 mg by mouth daily.    . Buprenorphine HCl (BELBUCA) 900 MCG FILM Place 900 mcg inside cheek every 12 (twelve) hours.    . carvedilol (COREG) 3.125 MG tablet TAKE 1 TABLET BY MOUTH TWICE DAILY 180 tablet 2  . celecoxib (CELEBREX) 200 MG capsule TAKE ONE CAPSULE BY MOUTH EVERY MORNING 90 capsule 2  . cyclobenzaprine (FLEXERIL) 10 MG tablet Take 10 mg by mouth 3 (three) times daily as needed for muscle spasms.    Marland Kitchen denosumab (PROLIA) 60 MG/ML SOSY injection Inject 60 mg into the skin every 6 (six) months. 180 mL 2  . diazepam (VALIUM) 5 MG tablet Take 1 tablet (5 mg total) by mouth 3 (three) times daily as needed. (Patient taking differently: Take 5 mg by mouth 3 (three) times daily as needed for anxiety.) 30 tablet 3  . diphenoxylate-atropine (LOMOTIL) 2.5-0.025 MG tablet Take 1 tablet by mouth 4 (four) times daily as needed for diarrhea or loose stools. 30 tablet 0  . ENTRESTO 24-26 MG TAKE ONE TABLET BY MOUTH  EVERY MORNING and TAKE ONE TABLET BY MOUTH EVERY EVENING 180 tablet 2  . EPINEPHRINE 0.3 mg/0.3 mL IJ SOAJ injection Inject 0.3 mLs (0.3 mg total) into the muscle as needed for anaphylaxis. 1 each 2  . esomeprazole (NEXIUM) 20 MG capsule Take 1 capsule (20 mg total) by mouth daily. 90 capsule 2  . fludrocortisone (FLORINEF) 0.1 MG tablet TAKE 1 TABLET BY MOUTH AS DIRECTED. (Patient taking differently: Take 0.1 mg by mouth daily.) 30 tablet 3  . fluticasone (FLONASE) 50 MCG/ACT nasal spray Place 2 sprays into both nostrils daily as needed (congestion). 18 mL 2  . fluticasone furoate-vilanterol (BREO ELLIPTA) 100-25 MCG/INH AEPB Inhale 1 puff into the lungs daily in the afternoon. 90 each 1  . furosemide (LASIX) 40 MG tablet TAKE ONE TABLET BY MOUTH EVERY MORNING MAY take 0.5 tablet in THE afternoon IF needed (Patient taking differently: Take 20 mg by mouth daily as needed for fluid.) 90 tablet 2  . gabapentin (NEURONTIN) 800 MG tablet Take 1 tablet (800 mg total) by mouth 3 (three) times daily. Patient usually takes twice a day (Patient taking differently: Take 800 mg by mouth 2 (two) times daily as needed (pain).) 270 tablet 2  .  ipratropium (ATROVENT) 0.02 % nebulizer solution Take 2.5 mLs (0.5 mg total) by nebulization every 4 (four) hours as needed for wheezing or shortness of breath. 150 mL 2  . levocetirizine (XYZAL) 5 MG tablet Take 5 mg by mouth daily as needed for allergies.    Marland Kitchen LINZESS 145 MCG CAPS capsule Take 145 mcg by mouth daily as needed (indigestion).    . Multiple Vitamin (MULTIVITAMIN WITH MINERALS) TABS tablet Take 1 tablet by mouth daily.    . nitroGLYCERIN (NITROSTAT) 0.4 MG SL tablet Place 1 tablet (0.4 mg total) under the tongue every 5 (five) minutes as needed for chest pain. 25 tablet 6  . ondansetron (ZOFRAN) 4 MG tablet Take 1 tablet (4 mg total) by mouth every 8 (eight) hours as needed for nausea or vomiting. 20 tablet 0  . Oxycodone HCl 10 MG TABS Take 1 tablet by mouth  every 4 (four) hours as needed (pain).    . ranolazine (RANEXA) 500 MG 12 hr tablet TAKE 1 TABLET(500 MG) BY MOUTH TWICE DAILY 180 tablet 2  . rosuvastatin (CRESTOR) 20 MG tablet TAKE ONE TABLET BY MOUTH EVERY MORNING 90 tablet 2  . sertraline (ZOLOFT) 100 MG tablet Take 0.5 tablets (50 mg total) by mouth daily. 90 tablet 2  . tiZANidine (ZANAFLEX) 4 MG tablet Take 4 mg by mouth at bedtime.    . topiramate (TOPAMAX) 50 MG tablet Take 2 tablets (100 mg total) by mouth at bedtime. 240 tablet 2  . UBRELVY 100 MG TABS TAKE 1 TABLET BY MOUTH BY MOUTH AS NEEDED( MAY REPEAT 1 TABLET AFTER 2 HOURS IF NEEDED, MAXIMUM 2 TABLETS IN 24 HOURS) 16 tablet 3   No current facility-administered medications for this visit.    Allergies:   Patient has no known allergies.   Social History:  The patient  reports that she quit smoking about 8 months ago. Her smoking use included cigarettes. She has a 15.00 pack-year smoking history. She has never used smokeless tobacco. She reports current alcohol use of about 2.0 standard drinks of alcohol per week. She reports that she does not use drugs.   Family History:  The patient's family history includes Alcohol abuse in her father; Cancer in an other family member; Heart attack in her father, mother, and another family member; Heart failure in an other family member; Hypertension in her father and mother.   ROS:  Please see the history of present illness.   Otherwise, review of systems is positive for none.   All other systems are reviewed and negative.   PHYSICAL EXAM: VS:  BP (!) 82/54   Pulse 71   Ht 5' (1.524 m)   Wt 127 lb 12.8 oz (58 kg)   SpO2 95%   BMI 24.96 kg/m  , BMI Body mass index is 24.96 kg/m. GEN: Well nourished, well developed, in no acute distress  HEENT: normal  Neck: no JVD, carotid bruits, or masses Cardiac: RRR; no murmurs, rubs, or gallops,no edema  Respiratory:  clear to auscultation bilaterally, normal work of breathing GI: soft,  nontender, nondistended, + BS MS: no deformity or atrophy  Skin: warm and dry, device site well healed Neuro:  Strength and sensation are intact Psych: euthymic mood, full affect  EKG:  EKG is not ordered today. Personal review of the ekg ordered 09/05/20 shows A paced  Personal review of the device interrogation today. Results in [place]: 07/05/2020: TSH 0.987 09/05/2020: NT-Pro BNP 546 10/18/2020: B Natriuretic Peptide  193.4 11/09/2020: ALT 12; BUN 11; Creatinine, Ser 0.96; Hemoglobin 10.1; Magnesium 2.0; Platelets 336; Potassium 4.2; Sodium 142    Lipid Panel     Component Value Date/Time   CHOL 141 07/05/2020 1214   TRIG 127 07/05/2020 1214   HDL 46 07/05/2020 1214   CHOLHDL 3.1 07/05/2020 1214   LDLCALC 72 07/05/2020 1214     Wt Readings from Last 3 Encounters:  11/27/20 127 lb 12.8 oz (58 kg)  11/09/20 126 lb (57.2 kg)  10/26/20 142 lb 13.7 oz (64.8 kg)      Other studies Reviewed: Additional studies/ records that were reviewed today include: TTE 11/09/20  Review of the above records today demonstrates:  1. Left ventricular ejection fraction, by estimation, is 30 to 35%. The  left ventricle has severely decreased function. The left ventricle  demonstrates global hypokinesis. There is moderate concentric left  ventricular hypertrophy. Left ventricular  diastolic parameters are consistent with Grade I diastolic dysfunction  (impaired relaxation).  2. Right ventricular systolic function is mildly reduced. The right  ventricular size is normal. There is normal pulmonary artery systolic  pressure.  3. The mitral valve is normal in structure. Mild to moderate mitral valve  regurgitation. No evidence of mitral stenosis.  4. The aortic valve is tricuspid. Aortic valve regurgitation is not  visualized. No aortic stenosis is present.  5. The inferior vena cava is normal in size with greater than 50%  respiratory variability, suggesting right atrial  pressure of 3 mmHg.    ASSESSMENT AND PLAN:  1.  Ischemic cardiomyopathy: Currently on optimal medical therapy.  Status post Medtronic dual-chamber ICD with generator change 05/21/2019.  Device functioning appropriately.  No changes at this time.   2.  Hypertension: Currently well controlled  3.  Hyperlipidemia: Statin per primary care  4.  Coronary artery disease: No current chest pain  5.  PVCs: 4% on cardiac monitor.  Continue with current management.  Current medicines are reviewed at length with the patient today.   The patient does not have concerns regarding her medicines.  The following changes were made today: None  Labs/ tests ordered today include:  No orders of the defined types were placed in this encounter.   Disposition:   FU with Demosthenes Virnig 12 months  Signed, Taelyn Nemes Meredith Leeds, MD  11/27/2020 2:33 PM     [address] [place] [place] [id number] [phone] (office) [phone] (fax)

## 2020-11-30 ENCOUNTER — Ambulatory Visit (INDEPENDENT_AMBULATORY_CARE_PROVIDER_SITE_OTHER): Payer: 59

## 2020-11-30 DIAGNOSIS — I420 Dilated cardiomyopathy: Secondary | ICD-10-CM | POA: Diagnosis not present

## 2020-11-30 LAB — CUP PACEART REMOTE DEVICE CHECK
Battery Remaining Longevity: 115 mo
Battery Voltage: 3.02 V
Brady Statistic AP VP Percent: 0.04 %
Brady Statistic AP VS Percent: 22.18 %
Brady Statistic AS VP Percent: 0.05 %
Brady Statistic AS VS Percent: 77.73 %
Brady Statistic RA Percent Paced: 21.76 %
Brady Statistic RV Percent Paced: 0.09 %
Date Time Interrogation Session: 20220407022604
HighPow Impedance: 69 Ohm
Implantable Lead Implant Date: 20120502
Implantable Lead Implant Date: 20120502
Implantable Lead Location: 753859
Implantable Lead Location: 753860
Implantable Lead Model: 4076
Implantable Lead Model: 7122
Implantable Pulse Generator Implant Date: 20200925
Lead Channel Impedance Value: 342 Ohm
Lead Channel Impedance Value: 589 Ohm
Lead Channel Impedance Value: 703 Ohm
Lead Channel Pacing Threshold Amplitude: 0.5 V
Lead Channel Pacing Threshold Amplitude: 1.125 V
Lead Channel Pacing Threshold Pulse Width: 0.4 ms
Lead Channel Pacing Threshold Pulse Width: 0.4 ms
Lead Channel Sensing Intrinsic Amplitude: 2.125 mV
Lead Channel Sensing Intrinsic Amplitude: 2.125 mV
Lead Channel Sensing Intrinsic Amplitude: 9 mV
Lead Channel Sensing Intrinsic Amplitude: 9 mV
Lead Channel Setting Pacing Amplitude: 1.5 V
Lead Channel Setting Pacing Amplitude: 2.25 V
Lead Channel Setting Pacing Pulse Width: 0.4 ms
Lead Channel Setting Sensing Sensitivity: 0.3 mV

## 2020-12-11 ENCOUNTER — Ambulatory Visit: Payer: 59 | Admitting: Legal Medicine

## 2020-12-12 ENCOUNTER — Ambulatory Visit: Payer: 59 | Admitting: Legal Medicine

## 2020-12-12 NOTE — Progress Notes (Signed)
Remote ICD transmission.   

## 2020-12-13 ENCOUNTER — Encounter: Payer: Self-pay | Admitting: Legal Medicine

## 2020-12-13 ENCOUNTER — Other Ambulatory Visit: Payer: Self-pay

## 2020-12-13 ENCOUNTER — Ambulatory Visit (INDEPENDENT_AMBULATORY_CARE_PROVIDER_SITE_OTHER): Payer: 59 | Admitting: Legal Medicine

## 2020-12-13 VITALS — BP 82/60 | HR 96 | Temp 97.7°F | Resp 16 | Ht 60.0 in | Wt 124.0 lb

## 2020-12-13 DIAGNOSIS — I10 Essential (primary) hypertension: Secondary | ICD-10-CM

## 2020-12-13 DIAGNOSIS — K632 Fistula of intestine: Secondary | ICD-10-CM

## 2020-12-13 DIAGNOSIS — E785 Hyperlipidemia, unspecified: Secondary | ICD-10-CM

## 2020-12-13 DIAGNOSIS — Z01818 Encounter for other preprocedural examination: Secondary | ICD-10-CM

## 2020-12-13 NOTE — Progress Notes (Signed)
Subjective:  Patient ID: [name], female    DOB: 29-Aug-1955  Age: 65 y.o. MRN: [id number]  Chief Complaint  Patient presents with  . surgery clearance  . Dental Pain    HPI : patient is here for surgical clearance for oral surgery.  CHF 2018 now stable, no strokes, no renl failure, no MI since 2011. She is seen by cardiology and has an ICD, no recent surgeries and no bleeding diathesis.  From cardiology 1.  Ischemic cardiomyopathy: Currently on optimal medical therapy.  Status post Medtronic dual-chamber ICD with generator change 05/21/2019.  Device functioning appropriately.  No changes at this time.   Additional studies/ records that were reviewed today include: TTE 11/09/20  Review of the above records today demonstrates:  1. Left ventricular ejection fraction, by estimation, is 30 to 35%. The  left ventricle has severely decreased function. The left ventricle  demonstrates global hypokinesis. There is moderate concentric left  ventricular hypertrophy. Left ventricular  diastolic parameters are consistent with Grade I diastolic dysfunction  (impaired relaxation).  2. Right ventricular systolic function is mildly reduced. The right  ventricular size is normal. There is normal pulmonary artery systolic  pressure.  3. The mitral valve is normal in structure. Mild to moderate mitral valve  regurgitation. No evidence of mitral stenosis.  4. The aortic valve is tricuspid. Aortic valve regurgitation is not  visualized. No aortic stenosis is present.  5. The inferior vena cava is normal in size with greater than 50%  respiratory variability, suggesting right atrial pressure of 3 mmHg.   From last note: HPI: transition of care and reconciliation of medicines. Patient was admitted to [facility] with sepsis and diveericular fistulas on 10/17/2020.  Blood cultures grew ou Staph epidermitis and she was treated with vancomycin.  She was rehydrated and discharged on  10/29/2020.  She can eat soft meals yet. regualr food causes vomiting.  " CT abdomen and pelvis 10/25/2020), with no acute findings within the abdomen or pelvis. No bowel obstruction. Colonic diverticulosis without evidence of acute diverticulitis. 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 Bowel wall thickening appreciated in this area on earlier CT has resolved. No evidence of acute enteritis, colitis or diverticulitis. Trace free fluid in the right lower pelvis. No abscess collection is seen. No free intraperitoneal air. Anasarca."  Current Outpatient Medications on File Prior to Visit  Medication Sig Dispense Refill  . acetaminophen (TYLENOL) 325 MG tablet Take 2 tablets (650 mg total) by mouth every 6 (six) hours as needed for mild pain (or Fever >/= 101).    Marland Kitchen aspirin 81 MG tablet Take 81 mg by mouth daily.    . Buprenorphine HCl (BELBUCA) 900 MCG FILM Place 900 mcg inside cheek every 12 (twelve) hours.    . carvedilol (COREG) 3.125 MG tablet TAKE 1 TABLET BY MOUTH TWICE DAILY 180 tablet 2  . celecoxib (CELEBREX) 200 MG capsule TAKE ONE CAPSULE BY MOUTH EVERY MORNING 90 capsule 2  . cyclobenzaprine (FLEXERIL) 10 MG tablet Take 10 mg by mouth 3 (three) times daily as needed for muscle spasms.    Marland Kitchen denosumab (PROLIA) 60 MG/ML SOSY injection Inject 60 mg into the skin every 6 (six) months. 180 mL 2  . diazepam (VALIUM) 5 MG tablet Take 1 tablet (5 mg total) by mouth 3 (three) times daily as needed. (Patient taking differently: Take 5  mg by mouth 3 (three) times daily as needed for anxiety.) 30 tablet 3  . diphenoxylate-atropine (LOMOTIL) 2.5-0.025 MG tablet Take 1 tablet by mouth 4 (four) times daily as needed for diarrhea or loose stools. 30 tablet 0  . ENTRESTO 24-26 MG TAKE ONE TABLET BY MOUTH EVERY MORNING and TAKE ONE TABLET BY  MOUTH EVERY EVENING 180 tablet 2  . EPINEPHRINE 0.3 mg/0.3 mL IJ SOAJ injection Inject 0.3 mLs (0.3 mg total) into the muscle as needed for anaphylaxis. 1 each 2  . esomeprazole (NEXIUM) 20 MG capsule Take 1 capsule (20 mg total) by mouth daily. 90 capsule 2  . fludrocortisone (FLORINEF) 0.1 MG tablet TAKE 1 TABLET BY MOUTH AS DIRECTED. (Patient taking differently: Take 0.1 mg by mouth daily.) 30 tablet 3  . fluticasone (FLONASE) 50 MCG/ACT nasal spray Place 2 sprays into both nostrils daily as needed (congestion). 18 mL 2  . fluticasone furoate-vilanterol (BREO ELLIPTA) 100-25 MCG/INH AEPB Inhale 1 puff into the lungs daily in the afternoon. 90 each 1  . furosemide (LASIX) 40 MG tablet TAKE ONE TABLET BY MOUTH EVERY MORNING MAY take 0.5 tablet in THE afternoon IF needed (Patient taking differently: Take 20 mg by mouth daily as needed for fluid.) 90 tablet 2  . gabapentin (NEURONTIN) 800 MG tablet Take 1 tablet (800 mg total) by mouth 3 (three) times daily. Patient usually takes twice a day (Patient taking differently: Take 800 mg by mouth 2 (two) times daily as needed (pain).) 270 tablet 2  . ipratropium (ATROVENT) 0.02 % nebulizer solution Take 2.5 mLs (0.5 mg total) by nebulization every 4 (four) hours as needed for wheezing or shortness of breath. 150 mL 2  . levocetirizine (XYZAL) 5 MG tablet Take 5 mg by mouth daily as needed for allergies.    Marland Kitchen LINZESS 145 MCG CAPS capsule Take 145 mcg by mouth daily as needed (indigestion).    . Multiple Vitamin (MULTIVITAMIN WITH MINERALS) TABS tablet Take 1 tablet by mouth daily.    . nitroGLYCERIN (NITROSTAT) 0.4 MG SL tablet Place 1 tablet (0.4 mg total) under the tongue every 5 (five) minutes as needed for chest pain. 25 tablet 6  . ondansetron (ZOFRAN) 4 MG tablet Take 1 tablet (4 mg total) by mouth every 8 (eight) hours as needed for nausea or vomiting. 20 tablet 0  . Oxycodone HCl 10 MG TABS Take 1 tablet by mouth every 4 (four) hours as needed (pain).     . ranolazine (RANEXA) 500 MG 12 hr tablet TAKE 1 TABLET(500 MG) BY MOUTH TWICE DAILY 180 tablet 2  . rosuvastatin (CRESTOR) 20 MG tablet TAKE ONE TABLET BY MOUTH EVERY MORNING 90 tablet 2  . sertraline (ZOLOFT) 100 MG tablet Take 0.5 tablets (50 mg total) by mouth daily. 90 tablet 2  . tiZANidine (ZANAFLEX) 4 MG tablet Take 4 mg by mouth at bedtime.    . topiramate (TOPAMAX) 50 MG tablet Take 2 tablets (100 mg total) by mouth at bedtime. 240 tablet 2  . UBRELVY 100 MG TABS TAKE 1 TABLET BY MOUTH BY MOUTH AS NEEDED( MAY REPEAT 1 TABLET AFTER 2 HOURS IF NEEDED, MAXIMUM 2 TABLETS IN 24 HOURS) 16 tablet 3   No current facility-administered medications on file prior to visit.   Past Medical History:  Diagnosis Date  . Admission for long-term opiate analgesic use 10/24/2019  . Arthritis of right hip 05/29/2016   Formatting of this note might be different from the original. Added automatically from request for  surgery [id number]  . BMI 26.0-26.9,adult 03/29/2020  . Cardiomyopathy [facility])    Overview:  Ejection fraction 45% in 2015 Ejection fraction 30 to 35% in November 2018  . Chronic back pain   . Chronic hypoxemic respiratory failure ([place]) 10/24/2019  . Chronic narcotic use 03/23/2015  . Chronic pain of both knees 12/21/2018   Added automatically from request for surgery [id number]  Formatting of this note might be different from the original. Added automatically from request for surgery [id number]  . Chronic systolic congestive heart failure, NYHA class 2 ([place]) 06/19/2017  . COPD (chronic obstructive pulmonary disease) ([place])   . Coronary artery disease involving native coronary artery of native heart without angina pectoris 05/31/2015   Overview:  Abnormal stress test in fall of 2016, cardiac catheterization showed normal coronaries.  . Cystitis 05/17/2020  . Dehydration 05/17/2020  . Diarrhea 05/17/2020  . Dilated cardiomyopathy ([place]) 06/19/2017  . 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 Dyslipidemia 05/31/2015  . Essential hypertension 05/31/2015  . GERD (gastroesophageal reflux disease)   . 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[place]) 07/13/2020  . Hypotension 05/17/2020  . ICD (implantable cardiac defibrillator) in place 2012  . 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 Ileus following gastrointestinal surgery ([place]) 03/26/2015  . Major depressive disorder, single episode, moderate ([place]) 10/24/2019  . Migraine without aura with status migrainosus 10/24/2019  . Nausea alone 04/19/2014  . Obstructive chronic bronchitis with exacerbation ([place]) 10/25/2019  . Other spondylosis with radiculopathy, lumbar region 10/24/2019  . Persistent vomiting 05/17/2020  . Presence of left artificial hip joint 10/24/2019  . PTSD (post-traumatic stress disorder)   . 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 Senile osteoporosis 10/24/2019  . Thyroid disease    Past Surgical History:  Procedure Laterality Date  . APPENDECTOMY    . CARDIAC CATHETERIZATION    . CARPAL TUNNEL RELEASE    . CESAREAN SECTION    . CHOLECYSTECTOMY    . CORONARY ANGIOPLASTY    . HAND SURGERY    . ICD GENERATOR CHANGEOUT N/A 05/21/2019   Procedure: ICD GENERATOR CHANGEOUT;  Surgeon: Constance Haw, MD;  Location: [facility];  Service: Cardiovascular;  Laterality: N/A;  . ICD IMPLANT     Medtronic  . intestinal blockage 2011    . LAPAROSCOPIC ASSISTED VENTRAL HERNIA REPAIR N/A 03/23/2015   Procedure: LAPAROSCOPIC VENTRAL WALL HERNIA REPAIR;  Surgeon: Michael Boston, MD;  Location: [facility];  Service: General;  Laterality: N/A;  With MESH  . LAPAROSCOPIC LYSIS OF ADHESIONS N/A 03/23/2015   Procedure: LAPAROSCOPIC LYSIS OF ADHESIONS;  Surgeon: Michael Boston, MD;  Location: [facility];  Service: General;  Laterality: N/A;  . NECK SURGERY     fused  . TONSILLECTOMY    . ULNAR NERVE TRANSPOSITION  01/23/2012    Procedure: ULNAR NERVE DECOMPRESSION/TRANSPOSITION;  Surgeon: Ophelia Charter, MD;  Location: [facility];  Service: Neurosurgery;  Laterality: Left;  LEFT ulnar nerve decompression    Family History  Problem Relation Age of Onset  . Heart attack Other   . Cancer Other   . Heart failure Other   . Hypertension Mother   . Heart attack Mother   . Alcohol abuse Father   . Hypertension Father   . Heart attack Father   . Anesthesia problems Neg Hx   . Hypotension Neg Hx   . Malignant hyperthermia Neg Hx   . Pseudochol deficiency  Neg Hx    Social History   Socioeconomic History  . Marital status: Single    Spouse name: Not on file  . Number of children: 3  . Years of education: Not on file  . Highest education level: Not on file  Occupational History  . Occupation: disabled  Tobacco Use  . Smoking status: Former Smoker    Packs/day: 0.50    Years: 30.00    Pack years: 15.00    Types: Cigarettes    Quit date: 03/2020    Years since quitting: 0.7  . Smokeless tobacco: Never Used  Vaping Use  . Vaping Use: Never used  Substance and Sexual Activity  . Alcohol use: Yes    Alcohol/week: 2.0 standard drinks    Types: 1 Cans of beer, 1 Standard drinks or equivalent per week    Comment: seldom  . Drug use: No  . Sexual activity: Not Currently  Other Topics Concern  . Not on file  Social History Narrative   One level home with boyfriend   Caffeine - coffee 1-2 cups/day; Green tea 4-5 bottles a day   Exercise - some    Right handed      Social Determinants of Health   Financial Resource Strain: Not on file  Food Insecurity: No Food Insecurity  . Worried About Charity fundraiser in the Last Year: Never true  . Ran Out of Food in the Last Year: Never true  Transportation Needs: Not on file  Physical Activity: Not on file  Stress: Not on file  Social Connections: Not on file    Review of Systems  Constitutional: Negative for activity change and appetite change.   HENT: Negative for congestion and rhinorrhea.   Eyes: Negative for visual disturbance.  Respiratory: Negative for chest tightness and shortness of breath.   Cardiovascular: Negative for chest pain, palpitations and leg swelling.  Gastrointestinal: Negative for abdominal distention and abdominal pain.  Endocrine: Negative for polyuria.  Genitourinary: Negative.  Negative for difficulty urinating and dysuria.  Musculoskeletal: Negative for arthralgias and back pain.  Skin: Negative.   Neurological: Negative.   Psychiatric/Behavioral: Negative.      Objective:  BP (!) 82/60   Pulse 96   Temp 97.7 F (36.5 C)   Resp 16   Ht 5' (1.524 m)   Wt 124 lb (56.2 kg)   SpO2 98%   BMI 24.22 kg/m   BP/Weight 12/13/2020 11/27/2020 05/16/1940  Systolic BP 82 82 740  Diastolic BP 60 54 60  Wt. (Lbs) 124 127.8 126  BMI 24.22 24.96 24.61    Physical Exam Vitals reviewed.  Constitutional:      Appearance: Normal appearance.  HENT:     Head: Normocephalic.     Right Ear: Tympanic membrane normal.     Left Ear: Tympanic membrane normal.     Nose: Nose normal.     Mouth/Throat:     Mouth: Mucous membranes are moist.     Pharynx: Oropharynx is clear.  Eyes:     Extraocular Movements: Extraocular movements intact.     Conjunctiva/sclera: Conjunctivae normal.     Pupils: Pupils are equal, round, and reactive to light.  Cardiovascular:     Rate and Rhythm: Normal rate and regular rhythm.     Pulses: Normal pulses.     Heart sounds: Normal heart sounds. No murmur heard. No gallop.   Pulmonary:     Effort: No respiratory distress.     Breath sounds: Normal  breath sounds. No rales.  Abdominal:     General: Abdomen is flat. Bowel sounds are normal. There is no distension.     Palpations: Abdomen is soft.     Tenderness: There is no abdominal tenderness.  Musculoskeletal:        General: Normal range of motion.     Cervical back: Normal range of motion and neck supple.  Skin:     General: Skin is warm and dry.     Capillary Refill: Capillary refill takes less than 2 seconds.  Neurological:     General: No focal deficit present.     Mental Status: She is alert and oriented to person, place, and time.     Motor: Weakness present.       Lab Results  Component Value Date   WBC 8.5 11/09/2020   HGB 10.1 (L) 11/09/2020   HCT 31.2 (L) 11/09/2020   PLT 336 11/09/2020   GLUCOSE 102 (H) 11/09/2020   CHOL 141 07/05/2020   TRIG 127 07/05/2020   HDL 46 07/05/2020   LDLCALC 72 07/05/2020   ALT 12 11/09/2020   AST 13 11/09/2020   NA 142 11/09/2020   K 4.2 11/09/2020   CL 110 (H) 11/09/2020   CREATININE 0.96 11/09/2020   BUN 11 11/09/2020   CO2 18 (L) 11/09/2020   TSH 0.987 07/05/2020   INR 1.0 10/18/2020      Assessment & Plan:   Diagnoses and all orders for this visit: Preoperative clearance Cleared for oral surgery  Essential hypertension -     CBC with Differential/Platelet -     Comprehensive metabolic panel An individual hypertension care plan was established and reinforced today.  The patient's status was assessed using clinical findings on exam and labs or diagnostic tests. The patient's success at meeting treatment goals on disease specific evidence-based guidelines and found to be well controlled. SELF MANAGEMENT: The patient and I together assessed ways to personally work towards obtaining the recommended goals. RECOMMENDATIONS: avoid decongestants found in common cold remedies, decrease consumption of alcohol, perform routine monitoring of BP with home BP cuff, exercise, reduction of dietary salt, take medicines as prescribed, try not to miss doses and quit smoking.  Regular exercise and maintaining a healthy weight is needed.  Stress reduction may help. A CLINICAL SUMMARY including written plan identify barriers to care unique to individual due to social or financial issues.  We attempt to mutually creat solutions for individual and family  understanding.  Dyslipidemia -     Lipid panel AN INDIVIDUAL CARE PLAN for hyperlipidemia/ cholesterol was established and reinforced today.  The patient's status was assessed using clinical findings on exam, lab and other diagnostic tests. The patient's disease status was assessed based on evidence-based guidelines and found to be fair controlled. MEDICATIONS were reviewed. SELF MANAGEMENT GOALS have been discussed and patient's success at attaining the goal of low cholesterol was assessed. RECOMMENDATION given include regular exercise 3 days a week and low cholesterol/low fat diet. CLINICAL SUMMARY including written plan to identify barriers unique to the patient due to social or economic  reasons was discussed.  Colonic fistula -     Ambulatory referral to General Surgery  refer to surgeon, patient unable to eat solids well after last hospitalization       Follow-up: No follow-ups on file.  An After Visit Summary was printed and given to the patient.  Reinaldo Meeker, MD [facility] [phone]

## 2020-12-14 LAB — COMPREHENSIVE METABOLIC PANEL
ALT: 8 IU/L (ref 0–32)
AST: 11 IU/L (ref 0–40)
Albumin/Globulin Ratio: 1.5 (ref 1.2–2.2)
Albumin: 4.1 g/dL (ref 3.8–4.8)
Alkaline Phosphatase: 72 IU/L (ref 44–121)
BUN/Creatinine Ratio: 12 (ref 12–28)
BUN: 15 mg/dL (ref 8–27)
Bilirubin Total: 0.2 mg/dL (ref 0.0–1.2)
CO2: 22 mmol/L (ref 20–29)
Calcium: 10 mg/dL (ref 8.7–10.3)
Chloride: 104 mmol/L (ref 96–106)
Creatinine, Ser: 1.22 mg/dL — ABNORMAL HIGH (ref 0.57–1.00)
Globulin, Total: 2.7 g/dL (ref 1.5–4.5)
Glucose: 167 mg/dL — ABNORMAL HIGH (ref 65–99)
Potassium: 4.4 mmol/L (ref 3.5–5.2)
Sodium: 142 mmol/L (ref 134–144)
Total Protein: 6.8 g/dL (ref 6.0–8.5)
eGFR: 49 mL/min/{1.73_m2} — ABNORMAL LOW (ref >59)

## 2020-12-14 LAB — CBC WITH DIFFERENTIAL/PLATELET
Basophils Absolute: 0.1 10*3/uL (ref 0.0–0.2)
Basos: 1 %
EOS (ABSOLUTE): 0.3 10*3/uL (ref 0.0–0.4)
Eos: 4 %
Hematocrit: 34.2 % (ref 34.0–46.6)
Hemoglobin: 11.2 g/dL (ref 11.1–15.9)
Immature Grans (Abs): 0 10*3/uL (ref 0.0–0.1)
Immature Granulocytes: 0 %
Lymphocytes Absolute: 1.8 10*3/uL (ref 0.7–3.1)
Lymphs: 22 %
MCH: 30.4 pg (ref 26.6–33.0)
MCHC: 32.7 g/dL (ref 31.5–35.7)
MCV: 93 fL (ref 79–97)
Monocytes Absolute: 0.5 10*3/uL (ref 0.1–0.9)
Monocytes: 7 %
Neutrophils Absolute: 5.3 10*3/uL (ref 1.4–7.0)
Neutrophils: 66 %
Platelets: 348 10*3/uL (ref 150–450)
RBC: 3.69 x10E6/uL — ABNORMAL LOW (ref 3.77–5.28)
RDW: 12.7 % (ref 11.7–15.4)
WBC: 7.9 10*3/uL (ref 3.4–10.8)

## 2020-12-14 LAB — LIPID PANEL
Chol/HDL Ratio: 3.6 ratio (ref 0.0–4.4)
Cholesterol, Total: 180 mg/dL (ref 100–199)
HDL: 50 mg/dL (ref >39)
LDL Chol Calc (NIH): 101 mg/dL — ABNORMAL HIGH (ref 0–99)
Triglycerides: 167 mg/dL — ABNORMAL HIGH (ref 0–149)
VLDL Cholesterol Cal: 29 mg/dL (ref 5–40)

## 2020-12-14 LAB — CARDIOVASCULAR RISK ASSESSMENT

## 2020-12-14 NOTE — Progress Notes (Signed)
Triglycerides are high 167, ldl cholesterol 101, CBC ok, glucose 167, kidney tests show stage 3a, liver tests normal lp

## 2020-12-15 ENCOUNTER — Other Ambulatory Visit: Payer: Self-pay | Admitting: Legal Medicine

## 2020-12-22 ENCOUNTER — Other Ambulatory Visit: Payer: Self-pay | Admitting: Legal Medicine

## 2020-12-22 DIAGNOSIS — I5022 Chronic systolic (congestive) heart failure: Secondary | ICD-10-CM

## 2020-12-22 DIAGNOSIS — M4726 Other spondylosis with radiculopathy, lumbar region: Secondary | ICD-10-CM

## 2020-12-26 ENCOUNTER — Telehealth: Payer: Self-pay

## 2020-12-26 NOTE — Progress Notes (Signed)
Chronic Care Management Pharmacy Assistant   Name: [name]  MRN: [id number] DOB: 1955-09-06  Reason for Encounter: Medication coordination for [facility]    Recent office visits:  12/13/20- Dr. Henrene Pastor PCP, preop clearance for dentist, Triglycerides are high 167, ldl cholesterol 101, CBC ok, glucose 167, kidney tests show stage 3a, liver tests normal  11/30/20-cardiology, device check  11/27/20-cardiology, follow up 42mos  Recent consult visits:  none Hospital visits:  None in previous 6 months  Medications: Outpatient Encounter Medications as of 12/26/2020  Medication Sig  . acetaminophen (TYLENOL) 325 MG tablet Take 2 tablets (650 mg total) by mouth every 6 (six) hours as needed for mild pain (or Fever >/= 101).  Marland Kitchen aspirin 81 MG tablet Take 81 mg by mouth daily.  . Buprenorphine HCl (BELBUCA) 900 MCG FILM Place 900 mcg inside cheek every 12 (twelve) hours.  . carvedilol (COREG) 3.125 MG tablet TAKE 1 TABLET BY MOUTH TWICE DAILY  . celecoxib (CELEBREX) 200 MG capsule TAKE ONE CAPSULE BY MOUTH EVERY MORNING  . cyclobenzaprine (FLEXERIL) 10 MG tablet Take 10 mg by mouth 3 (three) times daily as needed for muscle spasms.  Marland Kitchen denosumab (PROLIA) 60 MG/ML SOSY injection Inject 60 mg into the skin every 6 (six) months.  . diazepam (VALIUM) 5 MG tablet Take 1 tablet (5 mg total) by mouth 3 (three) times daily as needed for anxiety.  . diphenoxylate-atropine (LOMOTIL) 2.5-0.025 MG tablet Take 1 tablet by mouth 4 (four) times daily as needed for diarrhea or loose stools.  Marland Kitchen ENTRESTO 24-26 MG TAKE ONE TABLET BY MOUTH TWICE DAILY  . EPINEPHRINE 0.3 mg/0.3 mL IJ SOAJ injection Inject 0.3 mLs (0.3 mg total) into the muscle as needed for anaphylaxis.  Marland Kitchen esomeprazole (NEXIUM) 20 MG capsule Take 1 capsule (20 mg total) by mouth daily.  . fludrocortisone (FLORINEF) 0.1 MG tablet TAKE 1 TABLET BY MOUTH AS DIRECTED. (Patient taking differently: Take 0.1 mg by mouth daily.)  . fluticasone (FLONASE) 50  MCG/ACT nasal spray Place 2 sprays into both nostrils daily as needed (congestion).  . fluticasone furoate-vilanterol (BREO ELLIPTA) 100-25 MCG/INH AEPB Inhale 1 puff into the lungs daily in the afternoon.  . furosemide (LASIX) 40 MG tablet TAKE ONE TABLET BY MOUTH EVERY MORNING MAY take 0.5 tablet in THE afternoon IF needed (Patient taking differently: Take 20 mg by mouth daily as needed for fluid.)  . gabapentin (NEURONTIN) 800 MG tablet Take 1 tablet (800 mg total) by mouth 3 (three) times daily. Patient usually takes twice a day (Patient taking differently: Take 800 mg by mouth 2 (two) times daily as needed (pain).)  . ipratropium (ATROVENT) 0.02 % nebulizer solution Take 2.5 mLs (0.5 mg total) by nebulization every 4 (four) hours as needed for wheezing or shortness of breath.  . levocetirizine (XYZAL) 5 MG tablet Take 5 mg by mouth daily as needed for allergies.  Marland Kitchen LINZESS 145 MCG CAPS capsule Take 145 mcg by mouth daily as needed (indigestion).  . Multiple Vitamin (MULTIVITAMIN WITH MINERALS) TABS tablet Take 1 tablet by mouth daily.  . nitroGLYCERIN (NITROSTAT) 0.4 MG SL tablet Place 1 tablet (0.4 mg total) under the tongue every 5 (five) minutes as needed for chest pain.  Marland Kitchen ondansetron (ZOFRAN) 4 MG tablet Take 1 tablet (4 mg total) by mouth every 8 (eight) hours as needed for nausea or vomiting.  . Oxycodone HCl 10 MG TABS Take 1 tablet by mouth every 4 (four) hours as needed (pain).  . ranolazine (  RANEXA) 500 MG 12 hr tablet TAKE 1 TABLET(500 MG) BY MOUTH TWICE DAILY  . rosuvastatin (CRESTOR) 20 MG tablet TAKE ONE TABLET BY MOUTH EVERY MORNING  . sertraline (ZOLOFT) 100 MG tablet Take 0.5 tablets (50 mg total) by mouth daily.  Marland Kitchen tiZANidine (ZANAFLEX) 4 MG tablet Take 4 mg by mouth at bedtime.  . topiramate (TOPAMAX) 50 MG tablet Take 2 tablets (100 mg total) by mouth at bedtime.  Marland Kitchen UBRELVY 100 MG TABS TAKE 1 TABLET BY MOUTH BY MOUTH AS NEEDED( MAY REPEAT 1 TABLET AFTER 2 HOURS IF NEEDED,  MAXIMUM 2 TABLETS IN 24 HOURS)   No facility-administered encounter medications on file as of 12/26/2020.    Reviewed chart for medication changes ahead of medication coordination call.  No OVs, Consults, or hospital visits since last care coordination call/Pharmacist visit. (If appropriate, list visit date, provider name)  No medication changes indicated OR if recent visit, treatment plan here.  BP Readings from Last 3 Encounters:  12/13/20 (!) 82/60  11/27/20 (!) 82/54  11/09/20 107/60    No results found for: HGBA1C   Patient obtains medications through Vials  90 Days   Last adherence delivery included:  Xyzal 5 mg 1 tablet daily Diazepam 5 mg 1 tablet 3 times a day as needed Topiramate 50 mg 2 tablets at bedtime Esomeprazole 20 mg 1 capsule before breakfast daily Rosuvastatin 20 mg1 tablet in the morning Breo Ellipta 100-25 mcg Celebrex 200 mg  Gabapentin 800 mg  Entresto 24-26 mg  Furosemide 40 mg  Patient declined (meds) last month due to PRN use/additional supply on hand. Epi Pen- PRN use Prolia - Has6 months left Nitroglycerin sub 0.4 mg- PRN use Fluticasone - PRN use Ipratropium Bromide- States "another company fills this for her" Alendronate 70 mg -PCP discontinued Sertraline 50mg - Had enough on han Linzess 145 mcg- Had enough on hand  Explanation of abundance on hand (ie #30 due to overlapping fills or previous adherence issues etc)  Patient is due for next adherence delivery on: 01/04/21 Called patient and reviewed medications and coordinated delivery.  This delivery to include: Topiramate 50 mg 2 tablets at bedtime Esomeprazole 20 mg 1 capsule before breakfast daily Rosuvastatin 20 mg1 tablet in the morning Celebrex 200 mg  Entresto 24-26 mg   Patient declined the following medications, not time to fill  Prolia Breo Diazapam Furosemide  Patient needs refills for: none  Confirmed delivery date of 01/04/21, advised patient that pharmacy will  contact them the morning of delivery.  Clarita Leber, [facility] Pharmacist Assistant [phone]

## 2020-12-29 ENCOUNTER — Other Ambulatory Visit: Payer: 59

## 2020-12-29 DIAGNOSIS — I10 Essential (primary) hypertension: Secondary | ICD-10-CM

## 2020-12-30 LAB — COMPREHENSIVE METABOLIC PANEL
ALT: 7 IU/L (ref 0–32)
AST: 9 IU/L (ref 0–40)
Albumin/Globulin Ratio: 1.4 (ref 1.2–2.2)
Albumin: 3.8 g/dL (ref 3.8–4.8)
Alkaline Phosphatase: 70 IU/L (ref 44–121)
BUN/Creatinine Ratio: 13 (ref 12–28)
BUN: 14 mg/dL (ref 8–27)
Bilirubin Total: <0.2 mg/dL (ref 0.0–1.2)
CO2: 17 mmol/L — ABNORMAL LOW (ref 20–29)
Calcium: 9.3 mg/dL (ref 8.7–10.3)
Chloride: 114 mmol/L — ABNORMAL HIGH (ref 96–106)
Creatinine, Ser: 1.06 mg/dL — ABNORMAL HIGH (ref 0.57–1.00)
Globulin, Total: 2.7 g/dL (ref 1.5–4.5)
Glucose: 134 mg/dL — ABNORMAL HIGH (ref 65–99)
Potassium: 4 mmol/L (ref 3.5–5.2)
Sodium: 152 mmol/L — ABNORMAL HIGH (ref 134–144)
Total Protein: 6.5 g/dL (ref 6.0–8.5)
eGFR: 58 mL/min/{1.73_m2} — ABNORMAL LOW (ref >59)

## 2020-12-31 NOTE — Progress Notes (Signed)
Glucose 134, kidney tests remain stage 3a, sodium high at 152, liver tests normal, potassium 4.0 lp

## 2021-01-02 ENCOUNTER — Ambulatory Visit: Payer: Medicare Other | Admitting: Neurology

## 2021-01-03 ENCOUNTER — Ambulatory Visit (INDEPENDENT_AMBULATORY_CARE_PROVIDER_SITE_OTHER): Payer: 59 | Admitting: Cardiology

## 2021-01-03 ENCOUNTER — Other Ambulatory Visit: Payer: Self-pay

## 2021-01-03 ENCOUNTER — Encounter: Payer: Self-pay | Admitting: Cardiology

## 2021-01-03 VITALS — BP 92/60 | HR 89 | Ht 60.0 in | Wt 125.0 lb

## 2021-01-03 DIAGNOSIS — Z95810 Presence of automatic (implantable) cardiac defibrillator: Secondary | ICD-10-CM

## 2021-01-03 DIAGNOSIS — I5022 Chronic systolic (congestive) heart failure: Secondary | ICD-10-CM

## 2021-01-03 DIAGNOSIS — I9589 Other hypotension: Secondary | ICD-10-CM | POA: Diagnosis not present

## 2021-01-03 DIAGNOSIS — I2510 Atherosclerotic heart disease of native coronary artery without angina pectoris: Secondary | ICD-10-CM

## 2021-01-03 DIAGNOSIS — E861 Hypovolemia: Secondary | ICD-10-CM

## 2021-01-03 NOTE — Patient Instructions (Signed)
Medication Instructions:  Your physician recommends that you continue on your current medications as directed. Please refer to the Current Medication list given to you today.  *If you need a refill on your cardiac medications before your next appointment, please call your pharmacy*   Lab Work: None If you have labs (blood work) drawn today and your tests are completely normal, you will receive your results only by: Marland Kitchen MyChart Message (if you have MyChart) OR . A paper copy in the mail If you have any lab test that is abnormal or we need to change your treatment, we will call you to review the results.   Testing/Procedures: You will have a dvt study.   Follow-Up: At [facility], you and your health needs are our priority.  As part of our continuing mission to provide you with exceptional heart care, we have created designated Provider Care Teams.  These Care Teams include your primary Cardiologist (physician) and Advanced Practice Providers (APPs -  Physician Assistants and Nurse Practitioners) who all work together to provide you with the care you need, when you need it.  We recommend signing up for the patient portal called "MyChart".  Sign up information is provided on this After Visit Summary.  MyChart is used to connect with patients for Virtual Visits (Telemedicine).  Patients are able to view lab/test results, encounter notes, upcoming appointments, etc.  Non-urgent messages can be sent to your provider as well.   To learn more about what you can do with MyChart, go to [email or web address].    Your next appointment:   6 week(s)  The format for your next appointment:   In Person  Provider:   Jenne Campus, MD   Other Instructions

## 2021-01-03 NOTE — Progress Notes (Signed)
Cardiology Office Note:    Date:  01/03/2021   ID:  AFTAN VINT, DOB 07/19/1956, MRN [id number]  PCP:  Lillard Anes, MD  Cardiologist:  Jenne Campus, MD    Referring MD: Lillard Anes,*   Chief Complaint  Patient presents with  . bilateral legs and arm swelling     History of Present Illness:    [name] is a 65 y.o. female with past medical history significant for cardiomyopathy ejection fraction 35% last estimation in April 2021 showing ejection fraction 35 to 40%, BiV ICD, essential hypertension, fibromyalgia.  She comes today 2 months for follow-up overall she is doing fairly biggest complaint she causes leg pain.  Interestingly we did vascular study of her lower extremities did not show any critical lesions she does have triphasic flow all the way through.  The most tight lesion was 50 to 75% on the right iliac.  Still complaining having of leg pain and there is some swelling in the left lower extremities and also some tenderness.  I warned her about DVT we will do DVT study.  Past Medical History:  Diagnosis Date  . Abdominal pain 10/17/2020  . Abnormality of gait due to impairment of balance 11/22/2020  . Acute diverticulitis 10/17/2020  . Admission for long-term opiate analgesic use 10/24/2019  . Arthritis of right hip 05/29/2016   Formatting of this note might be different from the original. Added automatically from request for surgery [id number]  . BMI 26.0-26.9,adult 03/29/2020  . Cardiomyopathy [facility])    Overview:  Ejection fraction 45% in 2015 Ejection fraction 30 to 35% in November 2018  . Chronic back pain   . Chronic hypoxemic respiratory failure ([place]) 10/24/2019  . Chronic narcotic use 03/23/2015  . Chronic pain of both knees 12/21/2018   Added automatically from request for surgery [id number]  Formatting of this note might be different from the original. Added automatically from request for surgery [id number]  . Chronic systolic congestive heart failure,  NYHA class 2 ([place]) 06/19/2017  . 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 COPD (chronic obstructive pulmonary disease) ([place])   . Coronary artery disease involving native coronary artery of native heart without angina pectoris 05/31/2015   Overview:  Abnormal stress test in fall of 2016, cardiac catheterization showed normal coronaries.  . Cystitis 05/17/2020  . Dehydration 05/17/2020  . Diarrhea 05/17/2020  . Dilated cardiomyopathy ([place]) 06/19/2017  . Diverticulitis   . 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 Dyslipidemia 05/31/2015  . Essential hypertension 05/31/2015  . GERD (gastroesophageal reflux disease)   . 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[place]) 07/13/2020  . Hypotension 05/17/2020  . ICD (implantable cardiac defibrillator) in place 2012  . 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 Ileus following gastrointestinal surgery ([place]) 03/26/2015  . Major depressive disorder, single episode, moderate ([place]) 10/24/2019  . Malnutrition of moderate degree ([place]) 10/20/2020  . Mesenteric ischemia ([place]) 10/16/2020  . Migraine without aura with status migrainosus 10/24/2019  . Mixed incontinence 10/20/2020  . Nausea & vomiting   . Nausea alone 04/19/2014  . Obstructive chronic bronchitis with exacerbation ([place]) 10/25/2019  . Other spondylosis with radiculopathy, lumbar region 10/24/2019  . Persistent vomiting 05/17/2020  . Presence of left artificial hip joint 10/24/2019  . PTSD (post-traumatic stress disorder)   . 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 Senile osteoporosis 10/24/2019  . Sepsis ([place]) 10/17/2020  . Thyroid disease  Past Surgical History:  Procedure Laterality Date  . APPENDECTOMY    . CARDIAC CATHETERIZATION    . CARPAL TUNNEL RELEASE    . CESAREAN SECTION    . CHOLECYSTECTOMY    . CORONARY ANGIOPLASTY    . HAND SURGERY    . ICD GENERATOR CHANGEOUT N/A 05/21/2019    Procedure: ICD GENERATOR CHANGEOUT;  Surgeon: Constance Haw, MD;  Location: [place] [facility];  Service: Cardiovascular;  Laterality: N/A;  . ICD IMPLANT     Medtronic  . intestinal blockage 2011    . LAPAROSCOPIC ASSISTED VENTRAL HERNIA REPAIR N/A 03/23/2015   Procedure: LAPAROSCOPIC VENTRAL WALL HERNIA REPAIR;  Surgeon: Michael Boston, MD;  Location: [facility] ORS;  Service: General;  Laterality: N/A;  With MESH  . LAPAROSCOPIC LYSIS OF ADHESIONS N/A 03/23/2015   Procedure: LAPAROSCOPIC LYSIS OF ADHESIONS;  Surgeon: Michael Boston, MD;  Location: [facility] OR[facility];  Service: General;  Laterality: N/A;  . NECK SURGERY     fused  . TONSILLECTOMY    . ULNAR NERVE TRANSPOSITION  01/23/2012   Procedure: ULNAR NERVE DECOMPRESSION/TRANSPOSITION;  Surgeon: Ophelia Charter, MD;  Location: [place] [facility] ORS;  Service: Neurosurgery;  Laterality: Left;  LEFT ulnar nerve decompression    Current Medications: Current Meds  Medication Sig  . aspirin 81 MG tablet Take 81 mg by mouth daily.  . Buprenorphine HCl (BELBUCA) 900 MCG FILM Place 900 mcg inside cheek every 12 (twelve) hours.  . carvedilol (COREG) 3.125 MG tablet TAKE 1 TABLET BY MOUTH TWICE DAILY (Patient taking differently: Take 3.125 mg by mouth 2 (two) times daily with a meal.)  . celecoxib (CELEBREX) 200 MG capsule TAKE ONE CAPSULE BY MOUTH EVERY MORNING (Patient taking differently: Take 200 mg by mouth daily.)  . cyclobenzaprine (FLEXERIL) 10 MG tablet Take 10 mg by mouth 3 (three) times daily as needed for muscle spasms.  Marland Kitchen denosumab (PROLIA) 60 MG/ML SOSY injection Inject 60 mg into the skin every 6 (six) months.  . diazepam (VALIUM) 5 MG tablet Take 1 tablet (5 mg total) by mouth 3 (three) times daily as needed for anxiety.  . diphenoxylate-atropine (LOMOTIL) 2.5-0.025 MG tablet Take 1 tablet by mouth 4 (four) times daily as needed for diarrhea or loose stools.  Marland Kitchen ENTRESTO 24-26 MG TAKE ONE TABLET BY MOUTH TWICE DAILY (Patient taking differently: Take 1  tablet by mouth 2 (two) times daily.)  . EPINEPHRINE 0.3 mg/0.3 mL IJ SOAJ injection Inject 0.3 mLs (0.3 mg total) into the muscle as needed for anaphylaxis.  Marland Kitchen esomeprazole (NEXIUM) 20 MG capsule Take 1 capsule (20 mg total) by mouth daily.  . fludrocortisone (FLORINEF) 0.1 MG tablet TAKE 1 TABLET BY MOUTH AS DIRECTED. (Patient taking differently: Take 0.1 mg by mouth daily.)  . fluticasone (FLONASE) 50 MCG/ACT nasal spray Place 2 sprays into both nostrils daily as needed (congestion). (Patient taking differently: Place 2 sprays into both nostrils daily as needed for allergies (congestion).)  . fluticasone furoate-vilanterol (BREO ELLIPTA) 100-25 MCG/INH AEPB Inhale 1 puff into the lungs daily in the afternoon.  . furosemide (LASIX) 40 MG tablet TAKE ONE TABLET BY MOUTH EVERY MORNING MAY take 0.5 tablet in THE afternoon IF needed (Patient taking differently: Take 20 mg by mouth daily as needed for fluid.)  . gabapentin (NEURONTIN) 800 MG tablet Take 1 tablet (800 mg total) by mouth 3 (three) times daily. Patient usually takes twice a day (Patient taking differently: Take 800 mg by mouth 2 (two) times daily.)  . ipratropium (  ATROVENT) 0.02 % nebulizer solution Take 2.5 mLs (0.5 mg total) by nebulization every 4 (four) hours as needed for wheezing or shortness of breath.  . levocetirizine (XYZAL) 5 MG tablet Take 5 mg by mouth daily as needed for allergies.  Marland Kitchen LINZESS 145 MCG CAPS capsule Take 145 mcg by mouth daily as needed (indigestion).  . Multiple Vitamin (MULTIVITAMIN WITH MINERALS) TABS tablet Take 1 tablet by mouth daily. (Patient taking differently: Take 1 tablet by mouth daily. Unknown strength)  . nitroGLYCERIN (NITROSTAT) 0.4 MG SL tablet Place 1 tablet (0.4 mg total) under the tongue every 5 (five) minutes as needed for chest pain.  Marland Kitchen ondansetron (ZOFRAN) 4 MG tablet Take 1 tablet (4 mg total) by mouth every 8 (eight) hours as needed for nausea or vomiting.  . Oxycodone HCl 10 MG TABS Take  1 tablet by mouth every 4 (four) hours as needed (pain).  . ranolazine (RANEXA) 500 MG 12 hr tablet TAKE 1 TABLET(500 MG) BY MOUTH TWICE DAILY (Patient taking differently: Take 500 mg by mouth 2 (two) times daily. TAKE 1 TABLET(500 MG) BY MOUTH TWICE DAILY)  . rosuvastatin (CRESTOR) 20 MG tablet TAKE ONE TABLET BY MOUTH EVERY MORNING (Patient taking differently: Take 20 mg by mouth daily.)  . sertraline (ZOLOFT) 100 MG tablet Take 0.5 tablets (50 mg total) by mouth daily.  Marland Kitchen tiZANidine (ZANAFLEX) 4 MG tablet Take 4 mg by mouth at bedtime.  . topiramate (TOPAMAX) 50 MG tablet Take 2 tablets (100 mg total) by mouth at bedtime.  Marland Kitchen UBRELVY 100 MG TABS TAKE 1 TABLET BY MOUTH BY MOUTH AS NEEDED( MAY REPEAT 1 TABLET AFTER 2 HOURS IF NEEDED, MAXIMUM 2 TABLETS IN 24 HOURS) (Patient taking differently: Take 1 tablet by mouth as directed. Take 1 tab po as needed, may repeat 1 tab after 2 hour as needed max 2 tabs in 24 hours)  . [DISCONTINUED] acetaminophen (TYLENOL) 325 MG tablet Take 2 tablets (650 mg total) by mouth every 6 (six) hours as needed for mild pain (or Fever >/= 101).     Allergies:   Patient has no known allergies.   Social History   Socioeconomic History  . Marital status: Single    Spouse name: Not on file  . Number of children: 3  . Years of education: Not on file  . Highest education level: Not on file  Occupational History  . Occupation: disabled  Tobacco Use  . Smoking status: Former Smoker    Packs/day: 0.50    Years: 30.00    Pack years: 15.00    Types: Cigarettes    Quit date: 03/2020    Years since quitting: 0.7  . Smokeless tobacco: Never Used  Vaping Use  . Vaping Use: Never used  Substance and Sexual Activity  . Alcohol use: Yes    Alcohol/week: 2.0 standard drinks    Types: 1 Cans of beer, 1 Standard drinks or equivalent per week    Comment: seldom  . Drug use: No  . Sexual activity: Not Currently  Other Topics Concern  . Not on file  Social History  Narrative   One level home with boyfriend   Caffeine - coffee 1-2 cups/day; Green tea 4-5 bottles a day   Exercise - some    Right handed      Social Determinants of Health   Financial Resource Strain: Not on file  Food Insecurity: No Food Insecurity  . Worried About Charity fundraiser in the Last Year: Never  true  . Ran Out of Food in the Last Year: Never true  Transportation Needs: Not on file  Physical Activity: Not on file  Stress: Not on file  Social Connections: Not on file     Family History: The patient's family history includes Alcohol abuse in her father; Cancer in an other family member; Heart attack in her father, mother, and another family member; Heart failure in an other family member; Hypertension in her father and mother. There is no history of Anesthesia problems, Hypotension, Malignant hyperthermia, or Pseudochol deficiency. ROS:   Please see the history of present illness.    All 14 point review of systems negative except as described per history of present illness  EKGs/Labs/Other Studies Reviewed:      Recent Labs: 07/05/2020: TSH 0.987 09/05/2020: NT-Pro BNP 546 10/18/2020: B Natriuretic Peptide 193.4 11/09/2020: Magnesium 2.0 12/13/2020: Hemoglobin 11.2; Platelets 348 12/29/2020: ALT 7; BUN 14; Creatinine, Ser 1.06; Potassium 4.0; Sodium 152  Recent Lipid Panel    Component Value Date/Time   CHOL 180 12/13/2020 1206   TRIG 167 (H) 12/13/2020 1206   HDL 50 12/13/2020 1206   CHOLHDL 3.6 12/13/2020 1206   LDLCALC 101 (H) 12/13/2020 1206    Physical Exam:    VS:  BP 92/60 (BP Location: Left Arm, Patient Position: Sitting)   Pulse 89   Ht 5' (1.524 m)   Wt 125 lb (56.7 kg)   SpO2 90%   BMI 24.41 kg/m     Wt Readings from Last 3 Encounters:  01/03/21 125 lb (56.7 kg)  12/13/20 124 lb (56.2 kg)  11/27/20 127 lb 12.8 oz (58 kg)     GEN:  Well nourished, well developed in no acute distress HEENT: Normal NECK: No JVD; No carotid  bruits LYMPHATICS: No lymphadenopathy CARDIAC: RRR, no murmurs, no rubs, no gallops RESPIRATORY:  Clear to auscultation without rales, wheezing or rhonchi  ABDOMEN: Soft, non-tender, non-distended MUSCULOSKELETAL:  No edema; No deformity  SKIN: Warm and dry LOWER EXTREMITIES: Mild swelling left lower extremity NEUROLOGIC:  Alert and oriented x 3 PSYCHIATRIC:  Normal affect   ASSESSMENT:    1. Coronary artery disease involving native coronary artery of native heart without angina pectoris   2. Chronic systolic congestive heart failure, NYHA class 2 ([place])   3. Hypotension due to hypovolemia   4. 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 Coronary disease stable from that point review asymptomatic continue present management. 2. Cardiomyopathy with diminished ejection fraction.  Difficulties putting her right medication because of low blood pressure.  I actually asked her to hold for about a week morning dose of Entresto to see if pain in her leg will feel better.  I am worried about combination of hypotension as well as some atherosclerotic lesions in her lower extremities but enough to give her symptomatology.  I hope also that with increase of the blood pressure she will feel better.  I will do a DVT study of lower extremities and also I consider referring her to a neurologist. 3. ICD present.  ICD interrogation reviewed.  She is being followed by our EP team.   Medication Adjustments/Labs and Tests Ordered: Current medicines are reviewed at length with the patient today.  Concerns regarding medicines are outlined above.  No orders of the defined types were placed in this encounter.  Medication changes: No orders of the defined types were placed in this encounter.  Signed, Park Liter, MD, [facility] 01/03/2021 12:02 PM    [place]

## 2021-01-17 ENCOUNTER — Telehealth: Payer: Self-pay | Admitting: Cardiology

## 2021-01-17 ENCOUNTER — Other Ambulatory Visit: Payer: Self-pay

## 2021-01-17 DIAGNOSIS — M79606 Pain in leg, unspecified: Secondary | ICD-10-CM

## 2021-01-17 DIAGNOSIS — I5022 Chronic systolic (congestive) heart failure: Secondary | ICD-10-CM

## 2021-01-17 MED ORDER — FUROSEMIDE 40 MG PO TABS: ORAL_TABLET | ORAL | 2 refills | Status: DC

## 2021-01-17 NOTE — Telephone Encounter (Signed)
Patient reports she had lower extremity ultrasound at [place] and wants results of that. Will ask Dr. Agustin Cree to review.

## 2021-01-17 NOTE — Telephone Encounter (Signed)
New message:     Patient calling to results from last week.

## 2021-01-18 NOTE — Telephone Encounter (Signed)
Called patient informed of results of lower extremity ultrasound. Also put a referral for neurology per Dr. Agustin Cree. Patient aware no further questions.

## 2021-01-22 ENCOUNTER — Other Ambulatory Visit: Payer: Self-pay | Admitting: Legal Medicine

## 2021-01-23 NOTE — Addendum Note (Signed)
Addended by: Senaida Ores on: 01/23/2021 04:37 PM   Modules accepted: Orders

## 2021-01-24 ENCOUNTER — Other Ambulatory Visit: Payer: Self-pay

## 2021-01-24 ENCOUNTER — Telehealth: Payer: Self-pay

## 2021-01-24 MED ORDER — LINZESS 145 MCG PO CAPS: 145.0000 ug | ORAL_CAPSULE | Freq: Every day | ORAL | 2 refills | Status: DC | PRN

## 2021-01-24 MED ORDER — TOPIRAMATE 50 MG PO TABS: 100.0000 mg | ORAL_TABLET | Freq: Every day | ORAL | 2 refills | Status: DC

## 2021-01-24 NOTE — Progress Notes (Signed)
Chronic Care Management Pharmacy Assistant   Name: [name]  MRN: [id number] DOB: 12-03-1955  Reason for Encounter: Medication coordination for [place]  Recent office visits:  01/17/21-Cardiology, Called patient informed of results of lower extremity ultrasound. Also put a referral for neurology per Dr. Agustin Cree. Patient aware no further questions  01/03/21-Cardiology, VAS [place] of lower extremity for DVT, Coronary disease stable from that point review asymptomatic continue present management. Cardiomyopathy with diminished ejection fraction.  Difficulties putting her right medication because of low blood pressure.  I actually asked her to hold for about a week morning dose of Entresto to see if pain in her leg will feel better.  I am worried about combination of hypotension as well as some atherosclerotic lesions in her lower extremities but enough to give her symptomatology.  I hope also that with increase of the blood pressure she will feel better.  I will do a DVT study of lower extremities and also I consider referring her to a neurologist.  12/13/20-Dr. Henrene Pastor PCP, preop clearance for dentist, Triglycerides are high 167, ldl cholesterol 101, CBC ok, glucose 167, kidney tests show stage 3a, liver tests normal  Recent consult visits:  none  Hospital visits:  None in previous 6 months  Medications: Outpatient Encounter Medications as of 01/24/2021  Medication Sig  . aspirin 81 MG tablet Take 81 mg by mouth daily.  . Buprenorphine HCl (BELBUCA) 900 MCG FILM Place 900 mcg inside cheek every 12 (twelve) hours.  . carvedilol (COREG) 3.125 MG tablet TAKE 1 TABLET BY MOUTH TWICE DAILY (Patient taking differently: Take 3.125 mg by mouth 2 (two) times daily with a meal.)  . celecoxib (CELEBREX) 200 MG capsule TAKE ONE CAPSULE BY MOUTH EVERY MORNING (Patient taking differently: Take 200 mg by mouth daily.)  . cyclobenzaprine (FLEXERIL) 10 MG tablet Take 10 mg by mouth 3 (three) times daily  as needed for muscle spasms.  Marland Kitchen denosumab (PROLIA) 60 MG/ML SOSY injection Inject 60 mg into the skin every 6 (six) months.  . diazepam (VALIUM) 5 MG tablet Take 1 tablet (5 mg total) by mouth 3 (three) times daily as needed for anxiety.  . diphenoxylate-atropine (LOMOTIL) 2.5-0.025 MG tablet Take 1 tablet by mouth 4 (four) times daily as needed for diarrhea or loose stools.  Marland Kitchen ENTRESTO 24-26 MG TAKE ONE TABLET BY MOUTH TWICE DAILY (Patient taking differently: Take 1 tablet by mouth 2 (two) times daily.)  . EPINEPHRINE 0.3 mg/0.3 mL IJ SOAJ injection Inject 0.3 mLs (0.3 mg total) into the muscle as needed for anaphylaxis.  Marland Kitchen esomeprazole (NEXIUM) 20 MG capsule Take 1 capsule (20 mg total) by mouth daily.  . fludrocortisone (FLORINEF) 0.1 MG tablet TAKE 1 TABLET BY MOUTH AS DIRECTED. (Patient taking differently: Take 0.1 mg by mouth daily.)  . fluticasone (FLONASE) 50 MCG/ACT nasal spray Place 2 sprays into both nostrils daily as needed (congestion). (Patient taking differently: Place 2 sprays into both nostrils daily as needed for allergies (congestion).)  . fluticasone furoate-vilanterol (BREO ELLIPTA) 100-25 MCG/INH AEPB Inhale 1 puff into the lungs daily in the afternoon.  . furosemide (LASIX) 40 MG tablet TAKE ONE TABLET BY MOUTH EVERY MORNING MAY take 0.5 tablet in THE afternoon IF needed  . gabapentin (NEURONTIN) 800 MG tablet Take 1 tablet (800 mg total) by mouth 3 (three) times daily. Patient usually takes twice a day (Patient taking differently: Take 800 mg by mouth 2 (two) times daily.)  . ipratropium (ATROVENT) 0.02 % nebulizer solution Take  2.5 mLs (0.5 mg total) by nebulization every 4 (four) hours as needed for wheezing or shortness of breath.  Marland Kitchen ipratropium-albuterol (DUONEB) 0.5-2.5 (3) MG/3ML SOLN USE 1 VIAL IN NEBULIZER 4 TIMES DAILY  . levocetirizine (XYZAL) 5 MG tablet Take 5 mg by mouth daily as needed for allergies.  Marland Kitchen LINZESS 145 MCG CAPS capsule Take 145 mcg by mouth daily as  needed (indigestion).  . Multiple Vitamin (MULTIVITAMIN WITH MINERALS) TABS tablet Take 1 tablet by mouth daily. (Patient taking differently: Take 1 tablet by mouth daily. Unknown strength)  . naloxone (NARCAN) nasal spray 4 mg/0.1 mL SMARTSIG:Both Nares  . nitroGLYCERIN (NITROSTAT) 0.4 MG SL tablet Place 1 tablet (0.4 mg total) under the tongue every 5 (five) minutes as needed for chest pain.  Marland Kitchen ondansetron (ZOFRAN) 4 MG tablet Take 1 tablet (4 mg total) by mouth every 8 (eight) hours as needed for nausea or vomiting.  . Oxycodone HCl 10 MG TABS Take 1 tablet by mouth every 4 (four) hours as needed (pain).  . ranolazine (RANEXA) 500 MG 12 hr tablet TAKE 1 TABLET(500 MG) BY MOUTH TWICE DAILY (Patient taking differently: Take 500 mg by mouth 2 (two) times daily. TAKE 1 TABLET(500 MG) BY MOUTH TWICE DAILY)  . rosuvastatin (CRESTOR) 20 MG tablet TAKE ONE TABLET BY MOUTH EVERY MORNING (Patient taking differently: Take 20 mg by mouth daily.)  . sertraline (ZOLOFT) 100 MG tablet Take 0.5 tablets (50 mg total) by mouth daily.  Marland Kitchen tiZANidine (ZANAFLEX) 4 MG tablet Take 4 mg by mouth at bedtime.  . topiramate (TOPAMAX) 50 MG tablet Take 2 tablets (100 mg total) by mouth at bedtime.  Marland Kitchen UBRELVY 100 MG TABS TAKE 1 TABLET BY MOUTH BY MOUTH AS NEEDED( MAY REPEAT 1 TABLET AFTER 2 HOURS IF NEEDED, MAXIMUM 2 TABLETS IN 24 HOURS) (Patient taking differently: Take 1 tablet by mouth as directed. Take 1 tab po as needed, may repeat 1 tab after 2 hour as needed max 2 tabs in 24 hours)   No facility-administered encounter medications on file as of 01/24/2021.    Reviewed chart for medication changes ahead of medication coordination call.  No OVs, Consults, or hospital visits since last care coordination call/Pharmacist visit. (If appropriate, list visit date, provider name)  No medication changes indicated OR if recent visit, treatment plan here.  BP Readings from Last 3 Encounters:  01/03/21 92/60  12/13/20 (!) 82/60   11/27/20 (!) 82/54    No results found for: HGBA1C   Patient obtains medications through Vials  90 Days   Last adherence delivery included:  Topiramate 50 mg 2 tablets at bedtime Esomeprazole 20 mg 1 capsule before breakfast daily Rosuvastatin 20 mg1 tablet in the morning Celebrex 200 mg  Entresto 24-26 mg   Patient declined (meds) last month due to PRN use/additional supply on hand. Prolia Breo Diazapam Furosemide   Patient is due for next adherence delivery on: 02/02/21  Called patient and reviewed medications and coordinated delivery.  This delivery to include: Celebrex Entresto Rosuvastatin Esomeprazole Topiramate Breo Diazepam Sertraline Linzess  Patient declined the following medications (meds) due to (reason) Furosemide, got filled recently   Patient needs refills for  Linzess Topiramate  Confirmed delivery date of 02/02/21, advised patient that pharmacy will contact them the morning of delivery.   Star Rating Drugs: Carvedilol  01/01/21  90ds Furosemide 01/18/21 30ds Rosuvastatin 12/29/20  30ds    Clarita Leber, [place] Pharmacist Assistant [phone]

## 2021-02-15 ENCOUNTER — Encounter: Payer: Self-pay | Admitting: Legal Medicine

## 2021-02-15 ENCOUNTER — Other Ambulatory Visit: Payer: Self-pay

## 2021-02-15 ENCOUNTER — Ambulatory Visit (INDEPENDENT_AMBULATORY_CARE_PROVIDER_SITE_OTHER): Payer: 59 | Admitting: Legal Medicine

## 2021-02-15 VITALS — BP 80/56 | HR 102 | Temp 97.5°F | Ht 60.0 in | Wt 117.8 lb

## 2021-02-15 DIAGNOSIS — E079 Disorder of thyroid, unspecified: Secondary | ICD-10-CM

## 2021-02-15 DIAGNOSIS — I5022 Chronic systolic (congestive) heart failure: Secondary | ICD-10-CM | POA: Diagnosis not present

## 2021-02-15 DIAGNOSIS — J9611 Chronic respiratory failure with hypoxia: Secondary | ICD-10-CM | POA: Diagnosis not present

## 2021-02-15 DIAGNOSIS — E785 Hyperlipidemia, unspecified: Secondary | ICD-10-CM

## 2021-02-15 DIAGNOSIS — E440 Moderate protein-calorie malnutrition: Secondary | ICD-10-CM

## 2021-02-15 DIAGNOSIS — I2510 Atherosclerotic heart disease of native coronary artery without angina pectoris: Secondary | ICD-10-CM

## 2021-02-15 DIAGNOSIS — K219 Gastro-esophageal reflux disease without esophagitis: Secondary | ICD-10-CM

## 2021-02-15 DIAGNOSIS — J449 Chronic obstructive pulmonary disease, unspecified: Secondary | ICD-10-CM | POA: Diagnosis not present

## 2021-02-15 NOTE — Progress Notes (Signed)
Established Patient Office Visit  Subjective:  Patient ID: [name], female    DOB: 11/24/1955  Age: 65 y.o. MRN: [id number]  CC:  Chief Complaint  Patient presents with   Follow-up    HPI [name] presents for chronic visit  Peripheral vascular disease left leg, some blockages. No follow up for colonic fistula repair yet.  Patient is having no angina or chest pain.  No chf symptoms now.  Patient presents with hyperlipidemia.  Compliance with treatment has been good; patient takes medicines as directed, maintains low cholesterol diet, follows up as directed, and maintains exercise regimen.  Patient is using crestor without problems.   Past Medical History:  Diagnosis Date   Abdominal pain 10/17/2020   Abnormality of gait due to impairment of balance 11/22/2020   Acute diverticulitis 10/17/2020   Admission for long-term opiate analgesic use 10/24/2019   Arthritis of right hip 05/29/2016   Formatting of this note might be different from the original. Added automatically from request for surgery [id number]   BMI 26.0-26.9,adult 03/29/2020   Cardiomyopathy ([place])    Overview:  Ejection fraction 45% in 2015 Ejection fraction 30 to 35% in November 2018   Chronic back pain    Chronic hypoxemic respiratory failure ([place]) 10/24/2019   Chronic narcotic use 03/23/2015   Chronic pain of both knees 12/21/2018   Added automatically from request for surgery [id number]  Formatting of this note might be different from the original. 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[place]) 07/13/2020   Hypotension 05/17/2020   ICD (implantable cardiac defibrillator) in place 2012   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   Ileus following gastrointestinal surgery ([place]) 03/26/2015   Major depressive disorder, single episode, moderate ([place]) 10/24/2019   Malnutrition of moderate degree ([place]) 10/20/2020   Mesenteric ischemia ([place]) 10/16/2020   Migraine without aura with status migrainosus 10/24/2019   Mixed incontinence 10/20/2020   Nausea & vomiting    Nausea alone 04/19/2014   Obstructive chronic bronchitis with exacerbation ([place]) 10/25/2019   Other spondylosis with radiculopathy, lumbar region 10/24/2019   Persistent vomiting 05/17/2020   Presence of left artificial hip joint 10/24/2019   PTSD (post-traumatic stress disorder)    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 03/23/2015 04/19/2014   Senile osteoporosis 10/24/2019   Sepsis ([place]) 10/17/2020   Thyroid disease     Past Surgical History:  Procedure Laterality Date   APPENDECTOMY     CARDIAC CATHETERIZATION     CARPAL TUNNEL RELEASE     CESAREAN SECTION     CHOLECYSTECTOMY     CORONARY ANGIOPLASTY     HAND SURGERY     ICD GENERATOR CHANGEOUT N/A 05/21/2019   Procedure: ICD GENERATOR CHANGEOUT;  Surgeon: Constance Haw, MD;  Location: [facility];  Service: Cardiovascular;  Laterality: N/A;   ICD IMPLANT     Medtronic   intestinal blockage 2011     LAPAROSCOPIC ASSISTED VENTRAL HERNIA REPAIR N/A 03/23/2015   Procedure: LAPAROSCOPIC VENTRAL WALL HERNIA REPAIR;  Surgeon: Michael Boston, MD;  Location: [facility];  Service: General;  Laterality: N/A;  With  MESH   LAPAROSCOPIC LYSIS OF ADHESIONS N/A 03/23/2015   Procedure: LAPAROSCOPIC LYSIS OF ADHESIONS;  Surgeon: Michael Boston, MD;  Location: [facility];  Service: General;  Laterality: N/A;   NECK SURGERY     fused   TONSILLECTOMY     ULNAR NERVE TRANSPOSITION  01/23/2012   Procedure: ULNAR NERVE DECOMPRESSION/TRANSPOSITION;  Surgeon: Ophelia Charter, MD;  Location: [facility];  Service: Neurosurgery;  Laterality: Left;  LEFT ulnar nerve decompression    Family History  Problem Relation Age of Onset   Heart attack Other    Cancer Other    Heart failure Other    Hypertension Mother    Heart attack Mother    Alcohol abuse Father    Hypertension Father    Heart attack Father    Anesthesia problems Neg Hx    Hypotension Neg Hx    Malignant hyperthermia Neg Hx    Pseudochol deficiency Neg Hx     Social History   Socioeconomic History   Marital status: Single    Spouse name: Not on file   Number of children: 3   Years of education: Not on file   Highest education level: Not on file  Occupational History   Occupation: disabled  Tobacco Use   Smoking status: Former    Packs/day: 0.50    Years: 30.00    Pack years: 15.00    Types: Cigarettes    Quit date: 03/2020    Years since quitting: 0.8   Smokeless tobacco: Never  Vaping Use   Vaping Use: Never used  Substance and Sexual Activity   Alcohol use: Yes    Alcohol/week: 2.0 standard drinks    Types: 1 Cans of beer, 1 Standard drinks or equivalent per week    Comment: seldom   Drug use: No   Sexual activity: Not Currently  Other Topics Concern   Not on file  Social History Narrative   One level home with boyfriend   Caffeine - coffee 1-2 cups/day; Green tea 4-5 bottles a day   Exercise - some    Right handed      Social Determinants of Health   Financial Resource Strain: Not on file  Food Insecurity: No Food Insecurity   Worried About Charity fundraiser in the Last Year: Never true   Ran Out of Food in the Last  Year: Never true  Transportation Needs: Not on file  Physical Activity: Not on file  Stress: Not on file  Social Connections: Not on file  Intimate Partner Violence: Not on file    Outpatient Medications Prior to Visit  Medication Sig Dispense Refill   aspirin 81 MG tablet Take 81 mg by mouth daily.     Buprenorphine HCl (BELBUCA) 900 MCG FILM Place 900 mcg inside cheek every 12 (twelve) hours.     carvedilol (COREG) 3.125 MG tablet TAKE 1 TABLET BY MOUTH TWICE DAILY (Patient taking differently: Take 3.125 mg by mouth 2 (two) times daily with a meal.) 180 tablet 2   celecoxib (CELEBREX) 200 MG capsule TAKE ONE CAPSULE BY MOUTH EVERY MORNING (Patient taking differently: Take 200 mg by mouth daily.) 90 capsule  2   cyclobenzaprine (FLEXERIL) 10 MG tablet Take 10 mg by mouth 3 (three) times daily as needed for muscle spasms.     denosumab (PROLIA) 60 MG/ML SOSY injection Inject 60 mg into the skin every 6 (six) months. 180 mL 2   diazepam (VALIUM) 5 MG tablet Take 1 tablet (5 mg total) by mouth 3 (three) times daily as needed for anxiety. 90 tablet 3   diphenoxylate-atropine (LOMOTIL) 2.5-0.025 MG tablet Take 1 tablet by mouth 4 (four) times daily as needed for diarrhea or loose stools. 30 tablet 0   ENTRESTO 24-26 MG TAKE ONE TABLET BY MOUTH TWICE DAILY (Patient taking differently: Take 1 tablet by mouth 2 (two) times daily.) 180 tablet 2   EPINEPHRINE 0.3 mg/0.3 mL IJ SOAJ injection Inject 0.3 mLs (0.3 mg total) into the muscle as needed for anaphylaxis. 1 each 2   esomeprazole (NEXIUM) 20 MG capsule Take 1 capsule (20 mg total) by mouth daily. 90 capsule 2   fludrocortisone (FLORINEF) 0.1 MG tablet TAKE 1 TABLET BY MOUTH AS DIRECTED. (Patient taking differently: Take 0.1 mg by mouth daily.) 30 tablet 3   fluticasone (FLONASE) 50 MCG/ACT nasal spray Place 2 sprays into both nostrils daily as needed (congestion). (Patient taking differently: Place 2 sprays into both nostrils daily as needed for  allergies (congestion).) 18 mL 2   fluticasone furoate-vilanterol (BREO ELLIPTA) 100-25 MCG/INH AEPB Inhale 1 puff into the lungs daily in the afternoon. 90 each 1   furosemide (LASIX) 40 MG tablet TAKE ONE TABLET BY MOUTH EVERY MORNING MAY take 0.5 tablet in THE afternoon IF needed 90 tablet 2   gabapentin (NEURONTIN) 800 MG tablet Take 1 tablet (800 mg total) by mouth 3 (three) times daily. Patient usually takes twice a day (Patient taking differently: Take 800 mg by mouth 2 (two) times daily.) 270 tablet 2   ipratropium (ATROVENT) 0.02 % nebulizer solution Take 2.5 mLs (0.5 mg total) by nebulization every 4 (four) hours as needed for wheezing or shortness of breath. 150 mL 2   ipratropium-albuterol (DUONEB) 0.5-2.5 (3) MG/3ML SOLN USE 1 VIAL IN NEBULIZER 4 TIMES DAILY 180 mL 2   levocetirizine (XYZAL) 5 MG tablet Take 5 mg by mouth daily as needed for allergies.     LINZESS 145 MCG CAPS capsule Take 1 capsule (145 mcg total) by mouth daily as needed (indigestion). 90 capsule 2   Multiple Vitamin (MULTIVITAMIN WITH MINERALS) TABS tablet Take 1 tablet by mouth daily. (Patient taking differently: Take 1 tablet by mouth daily. Unknown strength)     naloxone (NARCAN) nasal spray 4 mg/0.1 mL SMARTSIG:Both Nares     nitroGLYCERIN (NITROSTAT) 0.4 MG SL tablet Place 1 tablet (0.4 mg total) under the tongue every 5 (five) minutes as needed for chest pain. 25 tablet 6   ondansetron (ZOFRAN) 4 MG tablet Take 1 tablet (4 mg total) by mouth every 8 (eight) hours as needed for nausea or vomiting. 20 tablet 0   Oxycodone HCl 10 MG TABS Take 1 tablet by mouth every 4 (four) hours as needed (pain).     ranolazine (RANEXA) 500 MG 12 hr tablet TAKE 1 TABLET(500 MG) BY MOUTH TWICE DAILY (Patient taking differently: Take 500 mg by mouth 2 (two) times daily. TAKE 1 TABLET(500 MG) BY MOUTH TWICE DAILY) 180 tablet 2   rosuvastatin (CRESTOR) 20 MG tablet TAKE ONE TABLET BY MOUTH EVERY MORNING (Patient taking differently:  Take 20 mg by mouth daily.) 90 tablet 2   sertraline (ZOLOFT)  100 MG tablet Take 0.5 tablets (50 mg total) by mouth daily. 90 tablet 2   tiZANidine (ZANAFLEX) 4 MG tablet Take 4 mg by mouth at bedtime.     topiramate (TOPAMAX) 50 MG tablet Take 2 tablets (100 mg total) by mouth at bedtime. 240 tablet 2   UBRELVY 100 MG TABS TAKE 1 TABLET BY MOUTH BY MOUTH AS NEEDED( MAY REPEAT 1 TABLET AFTER 2 HOURS IF NEEDED, MAXIMUM 2 TABLETS IN 24 HOURS) (Patient taking differently: Take 1 tablet by mouth as directed. Take 1 tab po as needed, may repeat 1 tab after 2 hour as needed max 2 tabs in 24 hours) 16 tablet 3   No facility-administered medications prior to visit.    No Known Allergies  ROS Review of Systems  Constitutional:  Negative for activity change and appetite change.  HENT:  Negative for congestion.   Eyes:  Negative for visual disturbance.  Respiratory:  Negative for cough, chest tightness and shortness of breath.   Cardiovascular:  Negative for chest pain and palpitations.  Gastrointestinal:  Negative for abdominal distention and abdominal pain.  Endocrine: Negative for polyuria.  Genitourinary:  Negative for difficulty urinating.  Musculoskeletal:  Negative for arthralgias and back pain.  Neurological: Negative.   Psychiatric/Behavioral: Negative.       Objective:    Physical Exam Vitals reviewed.  Constitutional:      Appearance: Normal appearance.     Comments: asthenic  HENT:     Right Ear: Tympanic membrane, ear canal and external ear normal.     Left Ear: Tympanic membrane, ear canal and external ear normal.     Nose: Nose normal. No congestion.     Mouth/Throat:     Mouth: Mucous membranes are moist.     Pharynx: Oropharynx is clear.  Eyes:     Extraocular Movements: Extraocular movements intact.     Conjunctiva/sclera: Conjunctivae normal.     Pupils: Pupils are equal, round, and reactive to light.  Cardiovascular:     Rate and Rhythm: Normal rate and regular  rhythm.     Pulses: Normal pulses.     Heart sounds: Normal heart sounds. No murmur heard.   No gallop.  Pulmonary:     Effort: Pulmonary effort is normal. No respiratory distress.     Breath sounds: No wheezing.  Abdominal:     General: Abdomen is flat. Bowel sounds are normal. There is no distension.     Palpations: Abdomen is soft.     Tenderness: There is no abdominal tenderness.  Musculoskeletal:     Cervical back: Normal range of motion.     Comments: Decreased muscle mass  Skin:    General: Skin is warm.     Capillary Refill: Capillary refill takes less than 2 seconds.  Neurological:     General: No focal deficit present.     Mental Status: [name] is alert. Mental status is at baseline.     Motor: Weakness (generalized) present.  Psychiatric:        Mood and Affect: Mood normal.        Behavior: Behavior normal.        Thought Content: Thought content normal.   Depression screen [facility] 2/9 02/15/2021 08/11/2020 07/05/2020 03/29/2020 12/22/2019  Decreased Interest _0 Down, Depressed, Hopeless _1 PHQ - 2 Score _2 Altered sleeping _3 0 2  Tired, decreased energy 2 3 3  1 2  Change in appetite - _0 Feeling bad or failure about yourself  _1 0 0  Trouble concentrating _2 0 0  Moving slowly or fidgety/restless _3 0 0  Suicidal thoughts 0 2 0 0 0  PHQ-9 Score _4 Difficult doing work/chores Very difficult Somewhat difficult Not difficult at all Not difficult at all -     BP (!) 80/56 (BP Location: Left Arm, Patient Position: Sitting, Cuff Size: Normal)   Pulse (!) 102   Temp (!) 97.5 F (36.4 C) (Temporal)   Ht 5' (1.524 m)   Wt 117 lb 12.8 oz (53.4 kg)   SpO2 93%   BMI 23.01 kg/m  Wt Readings from Last 3 Encounters:  02/15/21 117 lb 12.8 oz (53.4 kg)  01/03/21 125 lb (56.7 kg)  12/13/20 124 lb (56.2 kg)     Health Maintenance Due  Topic Date Due   COVID-19 Vaccine (1) Never done   Hepatitis C Screening  Never done    TETANUS/TDAP  Never done   Zoster Vaccines- Shingrix (1 of 2) Never done   MAMMOGRAM  09/22/2020    There are no preventive care reminders to display for this patient.  Lab Results  Component Value Date   TSH 0.987 07/05/2020   Lab Results  Component Value Date   WBC 7.9 12/13/2020   HGB 11.2 12/13/2020   HCT 34.2 12/13/2020   MCV 93 12/13/2020   PLT 348 12/13/2020   Lab Results  Component Value Date   NA 152 (H) 12/29/2020   K 4.0 12/29/2020   CO2 17 (L) 12/29/2020   GLUCOSE 134 (H) 12/29/2020   BUN 14 12/29/2020   CREATININE 1.06 (H) 12/29/2020   BILITOT <0.2 12/29/2020   ALKPHOS 70 12/29/2020   AST 9 12/29/2020   ALT 7 12/29/2020   PROT 6.5 12/29/2020   ALBUMIN 3.8 12/29/2020   CALCIUM 9.3 12/29/2020   ANIONGAP 6 10/29/2020   EGFR 58 (L) 12/29/2020   Lab Results  Component Value Date   CHOL 180 12/13/2020   Lab Results  Component Value Date   HDL 50 12/13/2020   Lab Results  Component Value Date   LDLCALC 101 (H) 12/13/2020   Lab Results  Component Value Date   TRIG 167 (H) 12/13/2020   Lab Results  Component Value Date   CHOLHDL 3.6 12/13/2020   No results found for: HGBA1C    Assessment & Plan:   Problem List Items Addressed This Visit       Cardiovascular and Mediastinum   Chronic systolic congestive heart failure, NYHA class 2 ([place]) An individualized care plan was established and reinforced.  The patient's disease status was assessed using clinical finding son exam today, labs, and/or other diagnostic testing such as x-rays, to determine the patient's success in meeting treatmentgoalsbased on disease-based guidelines and found to beimproving. But not at goal yet. Medications prescriptions no changes Laboratory tests ordered to be performed today include routine. RECOMMENDATIONS: given include see cardiology.  Call physician is patient gains 3 lbs in one day or 5 lbs for one week.  Call for progressive PND, orthopnea or increased pedal  edema.     Coronary artery disease involving native coronary artery of native heart without angina pectoris - Primary An individual plan was formulated based on patient history and exam, labs and evidence based data. Patient has not had recent angina or nitroglycerin use. continue  present treatment.      Respiratory   COPD (chronic obstructive pulmonary disease) ([place]) An individualize plan was formulated for care of COPD.  Treatment is evidence based.  [name] will continue on inhalers, avoid smoking and smoke.  Regular exercise with help with dyspnea. Routine follow ups and medication compliance is needed.     Chronic hypoxemic respiratory failure ([facility]) AN INDIVIDUAL CARE PLAN Chronic respiratory failure was established and reinforced today.  The patient's status was assessed using clinical findings on exam, labs, and other diagnostic testing. Patient's success at meeting treatment goals based on disease specific evidence-bassed guidelines and found to be in fair - using O2 PRN control. RECOMMENDATIONS include maintaining  present medicines and treatment.      Digestive   GERD (gastroesophageal reflux disease) Plan of care was formulated today.  [name] is doing well.  A plan of care was formulated using patient exam, tests and other sources to optimize care using evidence based information.  Recommend no smoking, no eating after supper, avoid fatty foods, elevate Head of bed, avoid tight fitting clothing.  Continue on nexium.      Endocrine   Thyroid disease Patient is known to have euthyroidism and is n treatment with no medicines.  Patient was diagnosed 10 years ago.  Other treatment includes none.  Patient is compliant with medicines and last TSH 6 months ago.  Last TSH was normal.      Other   Dyslipidemia AN INDIVIDUAL CARE PLAN for hyperlipidemia/ cholesterol was established and reinforced today.  The patient's status was assessed using clinical findings on exam, lab and other diagnostic  tests. The patient's disease status was assessed based on evidence-based guidelines and found to be fair controlled. MEDICATIONS were reviewed. SELF MANAGEMENT GOALS have been discussed and patient's success at attaining the goal of low cholesterol was assessed. RECOMMENDATION given include regular exercise 3 days a week and low cholesterol/low fat diet. CLINICAL SUMMARY including written plan to identify barriers unique to the patient due to social or economic  reasons was discussed.    Malnutrition of moderate degree (H[facility]) Supplement nutrition with protein/calorie supplement with meals to improve nutritional status.       Follow-up: Return in about 6 months (around 08/17/2021) for fasting.    Reinaldo Meeker, MD

## 2021-02-16 ENCOUNTER — Ambulatory Visit: Payer: Medicare Other | Admitting: Neurology

## 2021-02-16 ENCOUNTER — Ambulatory Visit: Payer: 59 | Admitting: Neurology

## 2021-02-16 LAB — CBC WITH DIFFERENTIAL/PLATELET
Basophils Absolute: 0.1 10*3/uL (ref 0.0–0.2)
Basos: 1 %
EOS (ABSOLUTE): 0.3 10*3/uL (ref 0.0–0.4)
Eos: 3 %
Hematocrit: 37.5 % (ref 34.0–46.6)
Hemoglobin: 12.5 g/dL (ref 11.1–15.9)
Immature Grans (Abs): 0.1 10*3/uL (ref 0.0–0.1)
Immature Granulocytes: 1 %
Lymphocytes Absolute: 2.5 10*3/uL (ref 0.7–3.1)
Lymphs: 24 %
MCH: 30.6 pg (ref 26.6–33.0)
MCHC: 33.3 g/dL (ref 31.5–35.7)
MCV: 92 fL (ref 79–97)
Monocytes Absolute: 0.6 10*3/uL (ref 0.1–0.9)
Monocytes: 6 %
Neutrophils Absolute: 6.9 10*3/uL (ref 1.4–7.0)
Neutrophils: 65 %
Platelets: 286 10*3/uL (ref 150–450)
RBC: 4.08 x10E6/uL (ref 3.77–5.28)
RDW: 12.1 % (ref 11.7–15.4)
WBC: 10.3 10*3/uL (ref 3.4–10.8)

## 2021-02-16 LAB — COMPREHENSIVE METABOLIC PANEL
ALT: 7 IU/L (ref 0–32)
AST: 15 IU/L (ref 0–40)
Albumin/Globulin Ratio: 1.8 (ref 1.2–2.2)
Albumin: 4.1 g/dL (ref 3.8–4.8)
Alkaline Phosphatase: 73 IU/L (ref 44–121)
BUN/Creatinine Ratio: 13 (ref 12–28)
BUN: 16 mg/dL (ref 8–27)
Bilirubin Total: 0.3 mg/dL (ref 0.0–1.2)
CO2: 24 mmol/L (ref 20–29)
Calcium: 10.1 mg/dL (ref 8.7–10.3)
Chloride: 103 mmol/L (ref 96–106)
Creatinine, Ser: 1.19 mg/dL — ABNORMAL HIGH (ref 0.57–1.00)
Globulin, Total: 2.3 g/dL (ref 1.5–4.5)
Glucose: 141 mg/dL — ABNORMAL HIGH (ref 65–99)
Potassium: 4.1 mmol/L (ref 3.5–5.2)
Sodium: 141 mmol/L (ref 134–144)
Total Protein: 6.4 g/dL (ref 6.0–8.5)
eGFR: 50 mL/min/{1.73_m2} — ABNORMAL LOW (ref >59)

## 2021-02-16 LAB — TSH: TSH: 0.816 u[IU]/mL (ref 0.450–4.500)

## 2021-02-16 LAB — LIPID PANEL
Chol/HDL Ratio: 3 ratio (ref 0.0–4.4)
Cholesterol, Total: 178 mg/dL (ref 100–199)
HDL: 60 mg/dL (ref >39)
LDL Chol Calc (NIH): 100 mg/dL — ABNORMAL HIGH (ref 0–99)
Triglycerides: 102 mg/dL (ref 0–149)
VLDL Cholesterol Cal: 18 mg/dL (ref 5–40)

## 2021-02-16 LAB — CARDIOVASCULAR RISK ASSESSMENT

## 2021-02-16 NOTE — Progress Notes (Signed)
Cbc normal, glucose 141, kidney tests remain 3a, liver tests normal, LDL cholesterol 100, TSH 0.816 normal lp

## 2021-02-20 NOTE — Progress Notes (Deleted)
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 4. Keep headache diary 5. Follow up ***  Subjective:  Ginnette Gates is a 65 year old Caucasian woman with CHF with EF 30-35%, s/p AICD, COPD, chronic back pain, chronic knee pain, arthritis, HTN, hyperlipidemia and PTSD and history of MI and former smoker and history of childhood seizures who follows up for migraines and dizziness.   UPDATE: Gabapentin?  Increased topiramate last visit. Intensity:  severe Duration:  All day.  1 hour if takes [place] immediately.   Frequency:  every other day. Frequency of abortive medication:sumatriptan daily Current NSAIDS:  ASA 81mg ; Celebrex Current analgesics:  none Current triptans:  none Current ergotamine:  none Current anti-emetic:  none Current muscle relaxants:  none Current anti-anxiolytic:  Valium Current sleep aide:  none Current Antihypertensive medications:  Coreg, Lasix Current Antidepressant medications:  none Current Anticonvulsant medications:  Topiramate 75mg  at bedtime; gabapentin 800mg  twice daily Current anti-CGRP:  Ubrelvy 100mg  Current Vitamins/Herbal/Supplements:  none Current Antihistamines/Decongestants:  Flonase Other therapy:  none Hormone/birth control:  none   Caffeine:  3 cups of coffee daily Alcohol:  no Smoker: former Diet: 100 oz water daily.  No soda. Exercise: no   HISTORY: She has had headaches since around 2017 but gradually got worse, particularly since November 2019.  They are severe bifrontal pounding/squeezing headache.  There is no aura.  They are associated with photophobia, phonophobia,  blurred vision and dizziness.  They typically last 1 hour and occur 5 to 6 a day every day.  They are triggered by loud noise.  Sumatriptan helps relieve the intensity but does not abort it..   Since November 2019, she also has had episodes of dizziness outside of the dizziness as well, causing her to fall.  She reports sudden spinning sensation lasting a few seconds and resolves.  She says it is not positional.  She has associated nausea and blurred vision.  She says it occurs 2 to 3 times a day.     She had a CT of head without contrast on 11/02/18 which was personally reviewed and was unremarkable, demonstrating mild atrophy and mild chronic small vessel ischemic changes but no acute intracranial abnormality.   She is treated by pain management for chronic pain.  She has degenerative disc disease of the cervical spine with previous neck fusion.  She has history of total right hip replacement.  She has chronic low back pain with radicular symptoms.  She has bilateral knee pain.   Past NSAIDS:  none Past analgesics:  Tylenol (contraindicated due to CHF) Past abortive triptans: sumatriptan 25mg  Past abortive ergotamine:  none Past muscle relaxants:  none Past anti-emetic:  none Past antihypertensive medications:  lisinopril Past antidepressant medications:  none Past anticonvulsant medications:  none Past anti-CGRP:  none Past vitamins/Herbal/Supplements:  none Past antihistamines/decongestants:  none Other past therapies:  none  PAST MEDICAL HISTORY: Past Medical History:  Diagnosis Date   Abdominal pain 10/17/2020   Abnormality of gait due to impairment of balance 11/22/2020   Acute diverticulitis 10/17/2020   Admission for long-term opiate analgesic use 10/24/2019   Arthritis of right hip 05/29/2016   Formatting of this note might  be different from the original. Added automatically from request for surgery [id number]   BMI 26.0-26.9,adult 03/29/2020   Cardiomyopathy ([place])    Overview:   Ejection fraction 45% in 2015 Ejection fraction 30 to 35% in November 2018   Chronic back pain    Chronic hypoxemic respiratory failure ([place]) 10/24/2019   Chronic narcotic use 03/23/2015   Chronic pain of both knees 12/21/2018   Added automatically from request for surgery [id number]  Formatting of this note might be different from the original. 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   Dyslipidemia 05/31/2015   Essential hypertension 05/31/2015   GERD (gastroesophageal reflux disease)    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[place]) 07/13/2020   Hypotension 05/17/2020   ICD (implantable cardiac defibrillator) in place 2012   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   Ileus following gastrointestinal surgery ([place]) 03/26/2015   Major depressive disorder, single episode, moderate ([place]) 10/24/2019   Malnutrition of moderate degree ([place]) 10/20/2020   Mesenteric ischemia ([place]) 10/16/2020   Migraine without aura with status migrainosus 10/24/2019   Mixed incontinence 10/20/2020   Nausea & vomiting    Nausea alone 04/19/2014   Obstructive chronic bronchitis with exacerbation ([place]) 10/25/2019   Other spondylosis with radiculopathy, lumbar region 10/24/2019   Persistent vomiting 05/17/2020   Presence of left artificial hip joint 10/24/2019   PTSD (post-traumatic stress disorder)    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     Buprenorphine HCl (BELBUCA) 900 MCG FILM Place 900 mcg inside cheek every 12 (twelve) hours.     carvedilol (COREG) 3.125 MG tablet TAKE 1 TABLET BY MOUTH TWICE DAILY (Patient taking differently: Take 3.125 mg by mouth 2 (two) times daily with a meal.) 180 tablet 2   celecoxib (CELEBREX) 200 MG capsule TAKE ONE CAPSULE BY MOUTH EVERY MORNING (Patient taking differently: Take 200 mg by mouth daily.) 90 capsule 2   cyclobenzaprine (FLEXERIL) 10 MG tablet Take 10 mg by mouth 3 (three) times daily as needed for muscle spasms.     denosumab (PROLIA) 60 MG/ML SOSY injection Inject 60 mg into the skin every 6 (six) months. 180 mL 2   diazepam (VALIUM) 5 MG tablet Take 1 tablet (5 mg total) by mouth 3 (three) times daily as needed for anxiety. 90 tablet 3   diphenoxylate-atropine (LOMOTIL) 2.5-0.025 MG tablet Take 1 tablet by mouth 4 (four) times daily as needed for diarrhea or loose stools. 30 tablet 0   ENTRESTO 24-26 MG TAKE ONE TABLET BY MOUTH TWICE DAILY (Patient taking differently: Take 1 tablet by mouth 2 (two) times daily.) 180 tablet 2   EPINEPHRINE 0.3 mg/0.3 mL IJ SOAJ injection Inject 0.3  mLs (0.3 mg total) into the muscle as needed for anaphylaxis. 1 each 2   esomeprazole (NEXIUM) 20 MG capsule Take 1 capsule (20 mg total) by mouth daily. 90 capsule 2   fludrocortisone (FLORINEF) 0.1 MG tablet TAKE 1 TABLET BY MOUTH AS DIRECTED. (Patient taking differently: Take 0.1 mg by mouth daily.) 30 tablet 3   fluticasone (FLONASE) 50 MCG/ACT nasal spray Place 2 sprays into both nostrils daily as needed (congestion). (Patient taking differently: Place 2 sprays into both nostrils daily as needed for  allergies (congestion).) 18 mL 2   fluticasone furoate-vilanterol (BREO ELLIPTA) 100-25 MCG/INH AEPB Inhale 1 puff into the lungs daily in the afternoon. 90 each 1   furosemide (LASIX) 40 MG tablet TAKE ONE TABLET BY MOUTH EVERY MORNING MAY take 0.5 tablet in THE afternoon IF needed 90 tablet 2   gabapentin (NEURONTIN) 800 MG tablet Take 1 tablet (800 mg total) by mouth 3 (three) times daily. Patient usually takes twice a day (Patient taking differently: Take 800 mg by mouth 2 (two) times daily.) 270 tablet 2   ipratropium (ATROVENT) 0.02 % nebulizer solution Take 2.5 mLs (0.5 mg total) by nebulization every 4 (four) hours as needed for wheezing or shortness of breath. 150 mL 2   ipratropium-albuterol (DUONEB) 0.5-2.5 (3) MG/3ML SOLN USE 1 VIAL IN NEBULIZER 4 TIMES DAILY 180 mL 2   levocetirizine (XYZAL) 5 MG tablet Take 5 mg by mouth daily as needed for allergies.     LINZESS 145 MCG CAPS capsule Take 1 capsule (145 mcg total) by mouth daily as needed (indigestion). 90 capsule 2   Multiple Vitamin (MULTIVITAMIN WITH MINERALS) TABS tablet Take 1 tablet by mouth daily. (Patient taking differently: Take 1 tablet by mouth daily. Unknown strength)     naloxone (NARCAN) nasal spray 4 mg/0.1 mL SMARTSIG:Both Nares     nitroGLYCERIN (NITROSTAT) 0.4 MG SL tablet Place 1 tablet (0.4 mg total) under the tongue every 5 (five) minutes as needed for chest pain. 25 tablet 6   ondansetron (ZOFRAN) 4 MG tablet Take 1 tablet (4 mg total) by mouth every 8 (eight) hours as needed for nausea or vomiting. 20 tablet 0   Oxycodone HCl 10 MG TABS Take 1 tablet by mouth every 4 (four) hours as needed (pain).     ranolazine (RANEXA) 500 MG 12 hr tablet TAKE 1 TABLET(500 MG) BY MOUTH TWICE DAILY (Patient taking differently: Take 500 mg by mouth 2 (two) times daily. TAKE 1 TABLET(500 MG) BY MOUTH TWICE DAILY) 180 tablet 2   rosuvastatin (CRESTOR) 20 MG tablet TAKE ONE TABLET BY MOUTH EVERY MORNING (Patient taking differently:  Take 20 mg by mouth daily.) 90 tablet 2   sertraline (ZOLOFT) 100 MG tablet Take 0.5 tablets (50 mg total) by mouth daily. 90 tablet 2   tiZANidine (ZANAFLEX) 4 MG tablet Take 4 mg by mouth at bedtime.     topiramate (TOPAMAX) 50 MG tablet Take 2 tablets (100 mg total) by mouth at bedtime. 240 tablet 2   UBRELVY 100 MG TABS TAKE 1 TABLET BY MOUTH BY MOUTH AS NEEDED( MAY REPEAT 1 TABLET AFTER 2 HOURS IF NEEDED, MAXIMUM 2 TABLETS IN 24 HOURS) (Patient taking differently: Take 1 tablet by mouth as directed. Take 1 tab po as needed, may repeat 1 tab after 2 hour as needed max 2 tabs in 24 hours) 16 tablet 3   No current facility-administered medications on file prior to visit.    ALLERGIES: No Known Allergies  FAMILY  HISTORY: Family History  Problem Relation Age of Onset   Heart attack Other    Cancer Other    Heart failure Other    Hypertension Mother    Heart attack Mother    Alcohol abuse Father    Hypertension Father    Heart attack Father    Anesthesia problems Neg Hx    Hypotension Neg Hx    Malignant hyperthermia Neg Hx    Pseudochol deficiency Neg Hx       Objective:  *** General: No acute distress.  Patient appears well-groomed.   Head:  Normocephalic/atraumatic Eyes:  Fundi examined but not visualized Neck: supple, no paraspinal tenderness, full range of motion Heart:  Regular rate and rhythm Lungs:  Clear to auscultation bilaterally Back: No paraspinal tenderness Neurological Exam: alert and oriented to person, place, and time.  Speech fluent and not dysarthric, language intact.  CN II-XII intact. Bulk and tone normal, muscle strength 5/5 throughout.  Sensation to light touch intact.  Deep tendon reflexes 2+ throughout.  Finger to nose testing intact.  Gait normal, Romberg negative.   Metta Clines, DO  CC: Reinaldo Meeker, MD

## 2021-02-21 ENCOUNTER — Ambulatory Visit: Payer: 59 | Admitting: Neurology

## 2021-02-22 ENCOUNTER — Other Ambulatory Visit: Payer: Self-pay

## 2021-02-22 ENCOUNTER — Encounter: Payer: Self-pay | Admitting: Cardiology

## 2021-02-22 ENCOUNTER — Ambulatory Visit (INDEPENDENT_AMBULATORY_CARE_PROVIDER_SITE_OTHER): Payer: 59

## 2021-02-22 ENCOUNTER — Ambulatory Visit (INDEPENDENT_AMBULATORY_CARE_PROVIDER_SITE_OTHER): Payer: 59 | Admitting: Cardiology

## 2021-02-22 VITALS — BP 92/50 | HR 71 | Ht 63.0 in | Wt 118.8 lb

## 2021-02-22 DIAGNOSIS — R42 Dizziness and giddiness: Secondary | ICD-10-CM

## 2021-02-22 DIAGNOSIS — I420 Dilated cardiomyopathy: Secondary | ICD-10-CM | POA: Diagnosis not present

## 2021-02-22 DIAGNOSIS — Z95810 Presence of automatic (implantable) cardiac defibrillator: Secondary | ICD-10-CM

## 2021-02-22 DIAGNOSIS — I2510 Atherosclerotic heart disease of native coronary artery without angina pectoris: Secondary | ICD-10-CM

## 2021-02-22 DIAGNOSIS — J449 Chronic obstructive pulmonary disease, unspecified: Secondary | ICD-10-CM

## 2021-02-22 NOTE — Addendum Note (Signed)
Addended by: Senaida Ores on: 02/22/2021 03:35 PM   Modules accepted: Orders

## 2021-02-22 NOTE — Patient Instructions (Signed)
Medication Instructions:  Your physician recommends that you continue on your current medications as directed. Please refer to the Current Medication list given to you today.  *If you need a refill on your cardiac medications before your next appointment, please call your pharmacy*   Lab Work: None If you have labs (blood work) drawn today and your tests are completely normal, you will receive your results only by: . MyChart Message (if you have MyChart) OR . A paper copy in the mail If you have any lab test that is abnormal or we need to change your treatment, we will call you to review the results.   Testing/Procedures: A zio monitor was ordered today. It will remain on for 7 days. You will then return monitor and event diary in provided box. It takes 1-2 weeks for report to be downloaded and returned to us. We will call you with the results. If monitor falls off or has orange flashing light, please call Zio for further instructions.      Follow-Up: At [facility], you and your health needs are our priority.  As part of our continuing mission to provide you with exceptional heart care, we have created designated Provider Care Teams.  These Care Teams include your primary Cardiologist (physician) and Advanced Practice Providers (APPs -  Physician Assistants and Nurse Practitioners) who all work together to provide you with the care you need, when you need it.  We recommend signing up for the patient portal called "MyChart".  Sign up information is provided on this After Visit Summary.  MyChart is used to connect with patients for Virtual Visits (Telemedicine).  Patients are able to view lab/test results, encounter notes, upcoming appointments, etc.  Non-urgent messages can be sent to your provider as well.   To learn more about what you can do with MyChart, go to [email or web address].    Your next appointment:   3 month(s)  The format for your next appointment:   In  Person  Provider:   Robert Krasowski, MD   Other Instructions    

## 2021-02-22 NOTE — Progress Notes (Signed)
Cardiology Office Note:    Date:  02/22/2021   ID:  [name], DOB [birth year], MRN [id number]  PCP:  Lillard Anes, MD  Cardiologist:  Jenne Campus, MD    Referring MD: Lillard Anes,*   Chief Complaint  Patient presents with   Dizziness    History of Present Illness:    [name] is a 66 y.o. female   with past medical history significant for cardiomyopathy ejection fraction 35% last estimation in April 2021 showing ejection fraction 35 to 40%, BiV ICD, essential hypertension, fibromyalgia. She is coming today for follow-up.  Overall she seems to be doing well but biggest complaint is dizziness she said sometimes she gets up she gets dizzy sometimes to get dizzy when she turns her head quickly said that she gets dizzy just sitting and doing nothing denies have any palpitation during that time there is no shortness of breath there is no chest pain.  Past Medical History:  Diagnosis Date   Abdominal pain 10/17/2020   Abnormality of gait due to impairment of balance 11/22/2020   Acute diverticulitis 10/17/2020   Admission for long-term opiate analgesic use 10/24/2019   Arthritis of right hip 05/29/2016   Formatting of this note might be different from the original. Added automatically from request for surgery [id number]   BMI 26.0-26.9,adult 03/29/2020   Cardiomyopathy ([place])    Overview:  Ejection fraction 45% in 2015 Ejection fraction 30 to 35% in November 2018   Chronic back pain    Chronic hypoxemic respiratory failure ([place]) 10/24/2019   Chronic narcotic use 03/23/2015   Chronic pain of both knees 12/21/2018   Added automatically from request for surgery [id number]  Formatting of this note might be different from the original. 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   Dyslipidemia 05/31/2015   Essential hypertension 05/31/2015   GERD (gastroesophageal reflux disease)    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[place]) 07/13/2020   Hypotension 05/17/2020   ICD (implantable cardiac defibrillator) in place 2012   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   Ileus following gastrointestinal surgery ([place]) 03/26/2015   Major depressive disorder, single episode, moderate ([place]) 10/24/2019   Malnutrition of moderate degree ([place]) 10/20/2020   Mesenteric ischemia ([place]) 10/16/2020   Migraine without aura with status migrainosus 10/24/2019   Mixed incontinence 10/20/2020   Nausea & vomiting    Nausea alone 04/19/2014   Obstructive chronic bronchitis with exacerbation ([place]) 10/25/2019   Other spondylosis with radiculopathy, lumbar region 10/24/2019   Persistent vomiting 05/17/2020   Presence of left artificial hip joint 10/24/2019   PTSD (post-traumatic stress disorder)    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 03/23/2015 04/19/2014   Senile osteoporosis 10/24/2019   Sepsis ([place]) 10/17/2020   Thyroid disease     Past Surgical History:  Procedure Laterality Date   APPENDECTOMY     CARDIAC CATHETERIZATION     CARPAL TUNNEL RELEASE  CESAREAN SECTION     CHOLECYSTECTOMY     CORONARY ANGIOPLASTY     HAND SURGERY     ICD GENERATOR CHANGEOUT N/A 05/21/2019   Procedure: ICD GENERATOR CHANGEOUT;  Surgeon: Constance Haw, MD;  Location: [facility];  Service: Cardiovascular;  Laterality: N/A;   ICD IMPLANT     Medtronic    intestinal blockage 2011     LAPAROSCOPIC ASSISTED VENTRAL HERNIA REPAIR N/A 03/23/2015   Procedure: LAPAROSCOPIC VENTRAL WALL HERNIA REPAIR;  Surgeon: Michael Boston, MD;  Location: [facility];  Service: General;  Laterality: N/A;  With MESH   LAPAROSCOPIC LYSIS OF ADHESIONS N/A 03/23/2015   Procedure: LAPAROSCOPIC LYSIS OF ADHESIONS;  Surgeon: Michael Boston, MD;  Location: [facility];  Service: General;  Laterality: N/A;   NECK SURGERY     fused   TONSILLECTOMY     ULNAR NERVE TRANSPOSITION  01/23/2012   Procedure: ULNAR NERVE DECOMPRESSION/TRANSPOSITION;  Surgeon: Ophelia Charter, MD;  Location: [facility];  Service: Neurosurgery;  Laterality: Left;  LEFT ulnar nerve decompression    Current Medications: Current Meds  Medication Sig   aspirin 81 MG tablet Take 81 mg by mouth daily.   Buprenorphine HCl (BELBUCA) 900 MCG FILM Place 900 mcg inside cheek every 12 (twelve) hours.   carvedilol (COREG) 3.125 MG tablet TAKE 1 TABLET BY MOUTH TWICE DAILY (Patient taking differently: Take 3.125 mg by mouth 2 (two) times daily with a meal.)   celecoxib (CELEBREX) 200 MG capsule TAKE ONE CAPSULE BY MOUTH EVERY MORNING (Patient taking differently: Take 200 mg by mouth daily.)   cyclobenzaprine (FLEXERIL) 10 MG tablet Take 10 mg by mouth 3 (three) times daily as needed for muscle spasms.   denosumab (PROLIA) 60 MG/ML SOSY injection Inject 60 mg into the skin every 6 (six) months.   diazepam (VALIUM) 5 MG tablet Take 1 tablet (5 mg total) by mouth 3 (three) times daily as needed for anxiety.   diphenoxylate-atropine (LOMOTIL) 2.5-0.025 MG tablet Take 1 tablet by mouth 4 (four) times daily as needed for diarrhea or loose stools.   ENTRESTO 24-26 MG TAKE ONE TABLET BY MOUTH TWICE DAILY (Patient taking differently: Take 1 tablet by mouth 2 (two) times daily.)   EPINEPHRINE 0.3 mg/0.3 mL IJ SOAJ injection Inject 0.3 mLs (0.3 mg total) into the muscle as needed for anaphylaxis.   esomeprazole (NEXIUM) 20 MG capsule  Take 1 capsule (20 mg total) by mouth daily.   fludrocortisone (FLORINEF) 0.1 MG tablet TAKE 1 TABLET BY MOUTH AS DIRECTED. (Patient taking differently: Take 0.1 mg by mouth daily.)   fluticasone (FLONASE) 50 MCG/ACT nasal spray Place 2 sprays into both nostrils daily as needed (congestion). (Patient taking differently: Place 2 sprays into both nostrils daily as needed for allergies (congestion).)   fluticasone furoate-vilanterol (BREO ELLIPTA) 100-25 MCG/INH AEPB Inhale 1 puff into the lungs daily in the afternoon.   furosemide (LASIX) 40 MG tablet TAKE ONE TABLET BY MOUTH EVERY MORNING MAY take 0.5 tablet in THE afternoon IF needed (Patient taking differently: Take 40 mg by mouth 2 (two) times daily. TAKE ONE TABLET BY MOUTH EVERY MORNING MAY take 0.5 tablet in THE afternoon IF needed)   gabapentin (NEURONTIN) 800 MG tablet Take 1 tablet (800 mg total) by mouth 3 (three) times daily. Patient usually takes twice a day (Patient taking differently: Take 800 mg by mouth 2 (two) times daily.)   ipratropium (ATROVENT) 0.02 % nebulizer solution Take 2.5 mLs (0.5 mg total) by  nebulization every 4 (four) hours as needed for wheezing or shortness of breath.   ipratropium-albuterol (DUONEB) 0.5-2.5 (3) MG/3ML SOLN USE 1 VIAL IN NEBULIZER 4 TIMES DAILY (Patient taking differently: Inhale 3 mLs into the lungs every 6 (six) hours as needed (sob).)   levocetirizine (XYZAL) 5 MG tablet Take 5 mg by mouth daily as needed for allergies.   LINZESS 145 MCG CAPS capsule Take 1 capsule (145 mcg total) by mouth daily as needed (indigestion).   Multiple Vitamin (MULTIVITAMIN WITH MINERALS) TABS tablet Take 1 tablet by mouth daily. (Patient taking differently: Take 1 tablet by mouth daily. Unknown strength)   naloxone (NARCAN) nasal spray 4 mg/0.1 mL Place 1 spray into the nose once.   nitroGLYCERIN (NITROSTAT) 0.4 MG SL tablet Place 1 tablet (0.4 mg total) under the tongue every 5 (five) minutes as needed for chest pain.    ondansetron (ZOFRAN) 4 MG tablet Take 1 tablet (4 mg total) by mouth every 8 (eight) hours as needed for nausea or vomiting.   Oxycodone HCl 10 MG TABS Take 1 tablet by mouth every 4 (four) hours as needed (pain).   ranolazine (RANEXA) 500 MG 12 hr tablet TAKE 1 TABLET(500 MG) BY MOUTH TWICE DAILY (Patient taking differently: Take 500 mg by mouth 2 (two) times daily. TAKE 1 TABLET(500 MG) BY MOUTH TWICE DAILY)   rosuvastatin (CRESTOR) 20 MG tablet TAKE ONE TABLET BY MOUTH EVERY MORNING (Patient taking differently: Take 20 mg by mouth daily.)   sertraline (ZOLOFT) 100 MG tablet Take 0.5 tablets (50 mg total) by mouth daily.   tiZANidine (ZANAFLEX) 4 MG tablet Take 4 mg by mouth at bedtime.   topiramate (TOPAMAX) 50 MG tablet Take 2 tablets (100 mg total) by mouth at bedtime.   UBRELVY 100 MG TABS TAKE 1 TABLET BY MOUTH BY MOUTH AS NEEDED( MAY REPEAT 1 TABLET AFTER 2 HOURS IF NEEDED, MAXIMUM 2 TABLETS IN 24 HOURS) (Patient taking differently: Take 1 tablet by mouth as directed. Take 1 tab po as needed, may repeat 1 tab after 2 hour as needed max 2 tabs in 24 hours)     Allergies:   Patient has no known allergies.   Social History   Socioeconomic History   Marital status: Single    Spouse name: Not on file   Number of children: 3   Years of education: Not on file   Highest education level: Not on file  Occupational History   Occupation: disabled  Tobacco Use   Smoking status: Former    Packs/day: 0.50    Years: 30.00    Pack years: 15.00    Types: Cigarettes    Quit date: 03/2020    Years since quitting: 0.9   Smokeless tobacco: Never  Vaping Use   Vaping Use: Never used  Substance and Sexual Activity   Alcohol use: Yes    Alcohol/week: 2.0 standard drinks    Types: 1 Cans of beer, 1 Standard drinks or equivalent per week    Comment: seldom   Drug use: No   Sexual activity: Not Currently  Other Topics Concern   Not on file  Social History Narrative   One level home with  boyfriend   Caffeine - coffee 1-2 cups/day; Green tea 4-5 bottles a day   Exercise - some    Right handed      Social Determinants of Health   Financial Resource Strain: Not on file  Food Insecurity: No Food Insecurity   Worried About Running  Out of Food in the Last Year: Never true   Ran Out of Food in the Last Year: Never true  Transportation Needs: Not on file  Physical Activity: Not on file  Stress: Not on file  Social Connections: Not on file     Family History: The patient's family history includes Alcohol abuse in her father; Cancer in an other family member; Heart attack in her father, mother, and another family member; Heart failure in an other family member; Hypertension in her father and mother. There is no history of Anesthesia problems, Hypotension, Malignant hyperthermia, or Pseudochol deficiency. ROS:   Please see the history of present illness.    All 14 point review of systems negative except as described per history of present illness  EKGs/Labs/Other Studies Reviewed:      Recent Labs: 09/05/2020: NT-Pro BNP 546 10/18/2020: B Natriuretic Peptide 193.4 11/09/2020: Magnesium 2.0 02/15/2021: ALT 7; BUN 16; Creatinine, Ser 1.19; Hemoglobin 12.5; Platelets 286; Potassium 4.1; Sodium 141; TSH 0.816  Recent Lipid Panel    Component Value Date/Time   CHOL 178 02/15/2021 1055   TRIG 102 02/15/2021 1055   HDL 60 02/15/2021 1055   CHOLHDL 3.0 02/15/2021 1055   LDLCALC 100 (H) 02/15/2021 1055    Physical Exam:    VS:  BP (!) 92/50 (BP Location: Right Arm, Patient Position: Sitting)   Pulse 71   Ht 5\' 3"  (1.6 m)   Wt 118 lb 12.8 oz (53.9 kg)   SpO2 97%   BMI 21.04 kg/m     Wt Readings from Last 3 Encounters:  02/22/21 118 lb 12.8 oz (53.9 kg)  02/15/21 117 lb 12.8 oz (53.4 kg)  01/03/21 125 lb (56.7 kg)     GEN:  Well nourished, well developed in no acute distress HEENT: Normal NECK: No JVD; No carotid bruits LYMPHATICS: No lymphadenopathy CARDIAC:  RRR, no murmurs, no rubs, no gallops RESPIRATORY:  Clear to auscultation without rales, wheezing or rhonchi  ABDOMEN: Soft, non-tender, non-distended MUSCULOSKELETAL:  No edema; No deformity  SKIN: Warm and dry LOWER EXTREMITIES: no swelling NEUROLOGIC:  Alert and oriented x 3 PSYCHIATRIC:  Normal affect   ASSESSMENT:    1. Coronary artery disease involving native coronary artery of native heart without angina pectoris   2. Dilated cardiomyopathy ([place])   3. Chronic obstructive pulmonary disease, unspecified COPD type ([place])   4. 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 Dilated cardiomyopathy on appropriate medication that she is tolerating.  The biggest problem is her blood pressure being low which limits our ability to put her on appropriate medications Dizziness which seems to be disturbing there is some component of vertigo also some component of orthostatic hypotension however I cannot completely exclude possibility of significant arrhythmia.  I did review interrogation of her device which did not show any significant arrhythmia however there is still something that may be below the detection rate that is responsible for her symptomatology.  Therefore, I will ask her to wear Zio patch for 1 week asked her to press the button she feel any dizziness. ICD present I did review interrogation normal function OptiVol last time was going up however overall today clinically she is compensated   Medication Adjustments/Labs and Tests Ordered: Current medicines are reviewed at length with the patient today.  Concerns regarding medicines are outlined above.  No orders  of the defined types were placed in this encounter.  Medication changes: No orders of the defined types were placed in this encounter.   Signed, Park Liter, MD, [facility] 02/22/2021  3:13 PM    [place]

## 2021-02-22 NOTE — Addendum Note (Signed)
Addended by: Senaida Ores on: 02/22/2021 03:25 PM   Modules accepted: Orders

## 2021-03-01 ENCOUNTER — Ambulatory Visit (INDEPENDENT_AMBULATORY_CARE_PROVIDER_SITE_OTHER): Payer: 59

## 2021-03-01 DIAGNOSIS — I420 Dilated cardiomyopathy: Secondary | ICD-10-CM

## 2021-03-05 LAB — CUP PACEART REMOTE DEVICE CHECK
Battery Remaining Longevity: 111 mo
Battery Voltage: 2.98 V
Brady Statistic AP VP Percent: 0.04 %
Brady Statistic AP VS Percent: 27.95 %
Brady Statistic AS VP Percent: 0.03 %
Brady Statistic AS VS Percent: 71.98 %
Brady Statistic RA Percent Paced: 27.21 %
Brady Statistic RV Percent Paced: 0.07 %
Date Time Interrogation Session: 20220711112646
HighPow Impedance: 63 Ohm
Implantable Lead Implant Date: 20120502
Implantable Lead Implant Date: 20120502
Implantable Lead Location: 753859
Implantable Lead Location: 753860
Implantable Lead Model: 4076
Implantable Lead Model: 7122
Implantable Pulse Generator Implant Date: 20200925
Lead Channel Impedance Value: 342 Ohm
Lead Channel Impedance Value: 532 Ohm
Lead Channel Impedance Value: 646 Ohm
Lead Channel Pacing Threshold Amplitude: 0.5 V
Lead Channel Pacing Threshold Amplitude: 1.125 V
Lead Channel Pacing Threshold Pulse Width: 0.4 ms
Lead Channel Pacing Threshold Pulse Width: 0.4 ms
Lead Channel Sensing Intrinsic Amplitude: 2.5 mV
Lead Channel Sensing Intrinsic Amplitude: 2.5 mV
Lead Channel Sensing Intrinsic Amplitude: 8.875 mV
Lead Channel Sensing Intrinsic Amplitude: 8.875 mV
Lead Channel Setting Pacing Amplitude: 1.5 V
Lead Channel Setting Pacing Amplitude: 2.25 V
Lead Channel Setting Pacing Pulse Width: 0.4 ms
Lead Channel Setting Sensing Sensitivity: 0.3 mV

## 2021-03-15 ENCOUNTER — Other Ambulatory Visit: Payer: Self-pay

## 2021-03-15 ENCOUNTER — Ambulatory Visit (INDEPENDENT_AMBULATORY_CARE_PROVIDER_SITE_OTHER): Payer: 59 | Admitting: Neurology

## 2021-03-15 ENCOUNTER — Encounter: Payer: Self-pay | Admitting: Neurology

## 2021-03-15 VITALS — BP 111/74 | HR 73 | Ht 63.0 in | Wt 122.2 lb

## 2021-03-15 DIAGNOSIS — G43709 Chronic migraine without aura, not intractable, without status migrainosus: Secondary | ICD-10-CM | POA: Diagnosis not present

## 2021-03-15 NOTE — Patient Instructions (Signed)
I want you to contact your insurance to find out which of the following medications they would cover and how much it would cost you.  Call us with their response and we will send the prescription:  - Aimovig  - Emgality  - Ajovy 2.   Ubrelvy as needed 3.  Limit use of pain relievers to no more than 2 days out of week to prevent risk of rebound or medication-overuse headache. 4.  Keep headache diary 5.  Follow up 6 months.

## 2021-03-15 NOTE — Progress Notes (Signed)
NEUROLOGY FOLLOW UP OFFICE NOTE  [name] [id number]  Assessment/Plan:   Chronic migraine without aura, without status migrainosus, not intractable   1.Migraine prevention:  Plan is to start a CGRP inhibitor.  I have asked patient to contact her insurance to see which would be covered ([employer], [employer], [employer]) and to contact us with their response; continue topiramate 100mg  QHS 2.Migraine rescue:  Roselyn Meier 100mg  3.Limit use of pain relievers to no more than 2 days out of week to prevent risk of rebound or medication-overuse headache. 4. Keep headache diary 5. Follow up 6 months.  Subjective:  [name] is a 65 year old Caucasian woman with CHF with EF 30-35%, s/p AICD, COPD, chronic back pain, chronic knee pain, arthritis, HTN, hyperlipidemia and PTSD and history of MI and former smoker and history of childhood seizures who follows up for migraines and dizziness.   UPDATE: Increased topiramate last visit.  They decreased to once to twice a week.  Over past 2 months, they have become daily.  Not sure why, maybe stress related to inflation.  Last month, Dr. Henrene [name] increased topiramate to 100mg  at bedtime.   Intensity:  severe Duration:  1 hour if takes Ubrelvy immediately.   Frequency:  Twice daily Frequency of abortive medication:sumatriptan daily Current NSAIDS:  ASA 81mg ; Celebrex Current analgesics:  oxycodone (for back pain - takes almost), Belbuca (for back pain) Current triptans:  none Current ergotamine:  none Current anti-emetic:  none Current muscle relaxants:  none Current anti-anxiolytic:  Valium Current sleep aide:  none Current Antihypertensive medications:  Coreg, Lasix Current Antidepressant medications:  sertraline Current Anticonvulsant medications:  Topiramate 100mg  at bedtime; gabapentin 800mg  twice daily Current anti-CGRP:  Ubrelvy 100mg  Current Vitamins/Herbal/Supplements:  none Current Antihistamines/Decongestants:  Flonase Other therapy:   none Hormone/birth control:  none   Caffeine:  1 cup of coffee daily Alcohol:  no Smoker: former Diet: 100 oz water daily.  No soda. Exercise: no Pain:  back pain, chronic   HISTORY: She has had headaches since around 2017 but gradually got worse, particularly since November 2019.  They are severe bifrontal pounding/squeezing headache.  There is no aura.  They are associated with photophobia, phonophobia, blurred vision and dizziness.  They typically last 1 hour and occur 5 to 6 a day every day.  They are triggered by loud noise.  Sumatriptan helps relieve the intensity but does not abort it..   Since November 2019, she also has had episodes of dizziness outside of the dizziness as well, causing her to fall.  She reports sudden spinning sensation lasting a few seconds and resolves.  She says it is not positional.  She has associated nausea and blurred vision.  She says it occurs 2 to 3 times a day.     She had a CT of head without contrast on 11/02/18 which was personally reviewed and was unremarkable, demonstrating mild atrophy and mild chronic small vessel ischemic changes but no acute intracranial abnormality.   She is treated by pain management for chronic pain.  She has degenerative disc disease of the cervical spine with previous neck fusion.  She has history of total right hip replacement.  She has chronic low back pain with radicular symptoms.  She has bilateral knee pain.   Past NSAIDS:  none Past analgesics:  Tylenol (contraindicated due to CHF) Past abortive triptans: sumatriptan 25mg  Past abortive ergotamine:  none Past muscle relaxants:  none Past anti-emetic:  none Past antihypertensive medications:  lisinopril Past antidepressant  medications:  none Past anticonvulsant medications:  none Past anti-CGRP:  none Past vitamins/Herbal/Supplements:  none Past antihistamines/decongestants:  none Other past therapies:  none  PAST MEDICAL HISTORY: Past Medical History:   Diagnosis Date   Abdominal pain 10/17/2020   Abnormality of gait due to impairment of balance 11/22/2020   Acute diverticulitis 10/17/2020   Admission for long-term opiate analgesic use 10/24/2019   Arthritis of right hip 05/29/2016   Formatting of this note might be different from the original. Added automatically from request for surgery [id number]   BMI 26.0-26.9,adult 03/29/2020   Cardiomyopathy ([place])    Overview:  Ejection fraction 45% in 2015 Ejection fraction 30 to 35% in November 2018   Chronic back pain    Chronic hypoxemic respiratory failure ([place]) 10/24/2019   Chronic narcotic use 03/23/2015   Chronic pain of both knees 12/21/2018   Added automatically from request for surgery [id number]  Formatting of this note might be different from the original. 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   Dyslipidemia 05/31/2015   Essential hypertension 05/31/2015   GERD (gastroesophageal reflux disease)    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[place]) 07/13/2020   Hypotension 05/17/2020   ICD (implantable cardiac defibrillator) in place 2012   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   Ileus following gastrointestinal surgery ([place]) 03/26/2015   Major depressive disorder, single episode, moderate ([place]) 10/24/2019   Malnutrition of moderate degree ([place]) 10/20/2020   Mesenteric ischemia ([place]) 10/16/2020    Migraine without aura with status migrainosus 10/24/2019   Mixed incontinence 10/20/2020   Nausea & vomiting    Nausea alone 04/19/2014   Obstructive chronic bronchitis with exacerbation ([place]) 10/25/2019   Other spondylosis with radiculopathy, lumbar region 10/24/2019   Persistent vomiting 05/17/2020   Presence of left artificial hip joint 10/24/2019   PTSD (post-traumatic stress disorder)    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 03/23/2015 04/19/2014   Senile osteoporosis 10/24/2019   Sepsis ([place]) 10/17/2020   Thyroid disease     MEDICATIONS: Current Outpatient Medications on File Prior to Visit  Medication Sig Dispense Refill   aspirin 81 MG tablet Take 81 mg by mouth daily.     Buprenorphine HCl (BELBUCA) 900 MCG FILM Place 900 mcg inside cheek every 12 (twelve) hours.     carvedilol (COREG) 3.125 MG tablet TAKE 1 TABLET BY MOUTH TWICE DAILY (Patient taking differently: Take 3.125 mg by mouth 2 (two) times daily with a meal.) 180 tablet 2   celecoxib (CELEBREX) 200 MG capsule TAKE ONE CAPSULE BY MOUTH EVERY MORNING (Patient taking differently: Take 200 mg by mouth daily.) 90 capsule 2   cyclobenzaprine (FLEXERIL) 10 MG tablet Take 10 mg by mouth 3 (three) times daily as needed for muscle spasms.     denosumab (PROLIA) 60 MG/ML SOSY injection Inject 60 mg into the skin every 6 (six) months. 180 mL 2   diazepam (VALIUM) 5 MG tablet Take 1 tablet (5  mg total) by mouth 3 (three) times daily as needed for anxiety. 90 tablet 3   diphenoxylate-atropine (LOMOTIL) 2.5-0.025 MG tablet Take 1 tablet by mouth 4 (four) times daily as needed for diarrhea or loose stools. 30 tablet 0   ENTRESTO 24-26 MG TAKE ONE TABLET BY MOUTH TWICE DAILY (Patient taking differently: Take 1 tablet by mouth 2 (two) times daily.) 180 tablet 2   EPINEPHRINE 0.3 mg/0.3 mL IJ SOAJ injection Inject 0.3 mLs (0.3 mg total) into the muscle as needed for anaphylaxis. 1 each 2   esomeprazole (NEXIUM) 20 MG  capsule Take 1 capsule (20 mg total) by mouth daily. 90 capsule 2   fludrocortisone (FLORINEF) 0.1 MG tablet TAKE 1 TABLET BY MOUTH AS DIRECTED. (Patient taking differently: Take 0.1 mg by mouth daily.) 30 tablet 3   fluticasone (FLONASE) 50 MCG/ACT nasal spray Place 2 sprays into both nostrils daily as needed (congestion). (Patient taking differently: Place 2 sprays into both nostrils daily as needed for allergies (congestion).) 18 mL 2   fluticasone furoate-vilanterol (BREO ELLIPTA) 100-25 MCG/INH AEPB Inhale 1 puff into the lungs daily in the afternoon. 90 each 1   furosemide (LASIX) 40 MG tablet TAKE ONE TABLET BY MOUTH EVERY MORNING MAY take 0.5 tablet in THE afternoon IF needed (Patient taking differently: Take 40 mg by mouth 2 (two) times daily. TAKE ONE TABLET BY MOUTH EVERY MORNING MAY take 0.5 tablet in THE afternoon IF needed) 90 tablet 2   gabapentin (NEURONTIN) 800 MG tablet Take 1 tablet (800 mg total) by mouth 3 (three) times daily. Patient usually takes twice a day (Patient taking differently: Take 800 mg by mouth 2 (two) times daily.) 270 tablet 2   ipratropium (ATROVENT) 0.02 % nebulizer solution Take 2.5 mLs (0.5 mg total) by nebulization every 4 (four) hours as needed for wheezing or shortness of breath. 150 mL 2   ipratropium-albuterol (DUONEB) 0.5-2.5 (3) MG/3ML SOLN USE 1 VIAL IN NEBULIZER 4 TIMES DAILY (Patient taking differently: Inhale 3 mLs into the lungs every 6 (six) hours as needed (sob).) 180 mL 2   levocetirizine (XYZAL) 5 MG tablet Take 5 mg by mouth daily as needed for allergies.     LINZESS 145 MCG CAPS capsule Take 1 capsule (145 mcg total) by mouth daily as needed (indigestion). 90 capsule 2   Multiple Vitamin (MULTIVITAMIN WITH MINERALS) TABS tablet Take 1 tablet by mouth daily. (Patient taking differently: Take 1 tablet by mouth daily. Unknown strength)     naloxone (NARCAN) nasal spray 4 mg/0.1 mL Place 1 spray into the nose once.     nitroGLYCERIN (NITROSTAT) 0.4  MG SL tablet Place 1 tablet (0.4 mg total) under the tongue every 5 (five) minutes as needed for chest pain. 25 tablet 6   ondansetron (ZOFRAN) 4 MG tablet Take 1 tablet (4 mg total) by mouth every 8 (eight) hours as needed for nausea or vomiting. 20 tablet 0   Oxycodone HCl 10 MG TABS Take 1 tablet by mouth every 4 (four) hours as needed (pain).     ranolazine (RANEXA) 500 MG 12 hr tablet TAKE 1 TABLET(500 MG) BY MOUTH TWICE DAILY (Patient taking differently: Take 500 mg by mouth 2 (two) times daily. TAKE 1 TABLET(500 MG) BY MOUTH TWICE DAILY) 180 tablet 2   rosuvastatin (CRESTOR) 20 MG tablet TAKE ONE TABLET BY MOUTH EVERY MORNING (Patient taking differently: Take 20 mg by mouth daily.) 90 tablet 2   sertraline (ZOLOFT) 100 MG tablet Take 0.5 tablets (50  mg total) by mouth daily. 90 tablet 2   tiZANidine (ZANAFLEX) 4 MG tablet Take 4 mg by mouth at bedtime.     topiramate (TOPAMAX) 50 MG tablet Take 2 tablets (100 mg total) by mouth at bedtime. 240 tablet 2   UBRELVY 100 MG TABS TAKE 1 TABLET BY MOUTH BY MOUTH AS NEEDED( MAY REPEAT 1 TABLET AFTER 2 HOURS IF NEEDED, MAXIMUM 2 TABLETS IN 24 HOURS) (Patient taking differently: Take 1 tablet by mouth as directed. Take 1 tab po as needed, may repeat 1 tab after 2 hour as needed max 2 tabs in 24 hours) 16 tablet 3   No current facility-administered medications on file prior to visit.    ALLERGIES: No Known Allergies  FAMILY HISTORY: Family History  Problem Relation Age of Onset   Heart attack Other    Cancer Other    Heart failure Other    Hypertension Mother    Heart attack Mother    Alcohol abuse Father    Hypertension Father    Heart attack Father    Anesthesia problems Neg Hx    Hypotension Neg Hx    Malignant hyperthermia Neg Hx    Pseudochol deficiency Neg Hx       Objective:  Blood pressure 111/74, pulse 73, height 5\' 3"  (1.6 m), weight 122 lb 4 oz (55.5 kg), SpO2 97 %. General: No acute distress.  Patient appears well-groomed.    Head:  Normocephalic/atraumatic Eyes:  Fundi examined but not visualized Neck: supple, no paraspinal tenderness, full range of motion Heart:  Regular rate and rhythm Lungs:  Clear to auscultation bilaterally Back: No paraspinal tenderness Neurological Exam: alert and oriented to person, place, and time.  Speech fluent and not dysarthric, language intact.  Notes reduced right V1-V2.  Otherwise, CN II-XII intact. Bulk and tone normal, muscle strength 5/5 throughout.  Sensation to light touch intact.  Deep tendon reflexes 2+ throughout.  Finger to nose testing intact.  Gait normal, Romberg negative.   Metta Clines, DO  CC: Reinaldo Meeker, MD

## 2021-03-16 ENCOUNTER — Telehealth: Payer: Self-pay | Admitting: Neurology

## 2021-03-16 ENCOUNTER — Other Ambulatory Visit: Payer: Self-pay | Admitting: Neurology

## 2021-03-16 MED ORDER — AIMOVIG 140 MG/ML ~~LOC~~ SOAJ: 140.0000 mg | SUBCUTANEOUS | 5 refills | Status: DC

## 2021-03-16 NOTE — Telephone Encounter (Signed)
Pt called in and left a message. She stated she was approved for all the medicine that Dr. Tomi Likens wanted to give her. It's up to him which one he wants to send in a prescription for.

## 2021-03-16 NOTE — Telephone Encounter (Signed)
Spoken to patient and notified Dr Georgie Chard comments. Verbalized understanding.

## 2021-03-20 NOTE — Progress Notes (Unsigned)
[name] ([name]) Rx #: [id number] Aimovig 140MG /ML auto-injectors   Form [employer] Medicare Part D Electronic Prior Authorization Form (2017 NCPDP) Created 4 days ago Sent to Plan 1 hour ago Plan Response 1 hour ago Submit Clinical Questions Expires 3 days from now Determination

## 2021-03-22 NOTE — Progress Notes (Signed)
Remote ICD transmission.   

## 2021-03-22 NOTE — Progress Notes (Unsigned)
Clinical questions sent to plan waiting for determination

## 2021-03-23 NOTE — Progress Notes (Unsigned)
[name] ([name]) Rx #: [id number] Aimovig 140MG /ML auto-injectors   Form [employer] Medicare Part D Electronic Prior Authorization Form (2017 NCPDP) Created 7 days ago Sent to Plan 3 days ago Plan Response 3 days ago Submit Clinical Questions 1 day ago Determination Favorable 1 day ago Message from Plan Request Reference Number: [id number]. AIMOVIG INJ 140MG /ML is approved through 08/25/2021. Your patient may now fill this prescription and it will b

## 2021-03-26 ENCOUNTER — Encounter: Payer: Self-pay | Admitting: Gastroenterology

## 2021-03-26 ENCOUNTER — Telehealth: Payer: Self-pay | Admitting: Neurology

## 2021-03-26 ENCOUNTER — Other Ambulatory Visit: Payer: Self-pay | Admitting: Legal Medicine

## 2021-03-26 NOTE — Telephone Encounter (Signed)
Pt wants to let jaffe know she has been having dizzy spells. She had mentioned it when last seen, but having more

## 2021-03-27 ENCOUNTER — Ambulatory Visit (INDEPENDENT_AMBULATORY_CARE_PROVIDER_SITE_OTHER): Payer: 59 | Admitting: Legal Medicine

## 2021-03-27 ENCOUNTER — Other Ambulatory Visit: Payer: Self-pay

## 2021-03-27 ENCOUNTER — Encounter: Payer: Self-pay | Admitting: Legal Medicine

## 2021-03-27 DIAGNOSIS — Z0000 Encounter for general adult medical examination without abnormal findings: Secondary | ICD-10-CM

## 2021-03-27 HISTORY — DX: Encounter for general adult medical examination without abnormal findings: Z00.00

## 2021-03-27 NOTE — Telephone Encounter (Signed)
Per pt the dizzy spells come three times a day now, With a headache sometimes. It could happen while laying down, When she goes from sitting and standing. Or just walking.     Please advise?

## 2021-03-27 NOTE — Progress Notes (Signed)
Subjective:  Patient ID: [name], female    DOB: Dec 24, 1955  Age: 65 y.o. MRN: [id number]  Chief Complaint  Patient presents with   Annual Wellnes Visit    HPI Encounter for general adult medical examination without abnormal findings  Physical ("At Risk" items are starred): Patient's last physical exam was 1 year ago .  Smoking: Life-long non-smoker ;  Physical Activity: Exercises at least sedentary Alcohol/Drug Use: Is a non-drinker ; No illicit drug use ;  Patient is afflicted from Stress Incontinence and Urge Incontinence  Safety: reviewed ; Patient wears a seat belt, has smoke detectors, has carbon monoxide detectors, practices appropriate gun safety, and wears sunscreen with extended sun exposure. Dental Care: biannual cleanings, brushes and flosses daily. Ophthalmology/Optometry: Annual visit.  Hearing loss: none Vision impairments: none Headaches and now on aimivig from neurologist  2021 osteoporosis on prolia 2020 normal mammogram  Fall Risk  03/27/2021 03/15/2021 07/05/2020 06/11/2019 02/05/2019  Falls in the past year? '1 1 1 1 1  ' Number falls in past yr: '1 1 1 1 1  ' Injury with Fall? 0 1 0 0 1  Risk for fall due to : Impaired balance/gait;Other (Comment) - Impaired balance/gait;Other (Comment) - -  Follow up Falls prevention discussed - - - -     Depression screen [facility] 2/9 03/27/2021 02/15/2021 08/11/2020 07/05/2020 03/29/2020  Decreased Interest '2 2 2 2 1  ' Down, Depressed, Hopeless '2 3 2 2 1  ' PHQ - 2 Score '4 5 4 4 2  ' Altered sleeping '3 3 3 3 ' 0  Tired, decreased energy '3 2 3 3 1  ' Change in appetite 3 - '3 3 2  ' Feeling bad or failure about yourself  0 '2 3 2 ' 0  Trouble concentrating '3 3 2 2 ' 0  Moving slowly or fidgety/restless '2 2 3 3 ' 0  Suicidal thoughts 0 0 2 0 0  PHQ-9 Score '18 17 23 20 5  ' Difficult doing work/chores Very difficult Very difficult Somewhat difficult Not difficult at all Not difficult at all       Functional Status Survey: Is the patient deaf  or have difficulty hearing?: Yes Does the patient have difficulty seeing, even when wearing glasses/contacts?: No Does the patient have difficulty concentrating, remembering, or making decisions?: Yes Does the patient have difficulty walking or climbing stairs?: Yes Does the patient have difficulty dressing or bathing?: No Does the patient have difficulty doing errands alone such as visiting a doctor's office or shopping?: No   Social Hx   Social History   Socioeconomic History   Marital status: Single    Spouse name: Not on file   Number of children: 3   Years of education: Not on file   Highest education level: Not on file  Occupational History   Occupation: disabled  Tobacco Use   Smoking status: Former    Packs/day: 0.50    Years: 30.00    Pack years: 15.00    Types: Cigarettes    Quit date: 03/2020    Years since quitting: 1.0   Smokeless tobacco: Never  Vaping Use   Vaping Use: Never used  Substance and Sexual Activity   Alcohol use: Yes    Alcohol/week: 2.0 standard drinks    Types: 1 Cans of beer, 1 Standard drinks or equivalent per week    Comment: seldom   Drug use: No   Sexual activity: Not Currently  Other Topics Concern   Not on file  Social History Narrative  One level home with boyfriend   Caffeine - coffee 1-2 cups/day; Green tea 4-5 bottles a day   Exercise - some    Right handed      Social Determinants of Health   Financial Resource Strain: Low Risk    Difficulty of Paying Living Expenses: Not hard at all  Food Insecurity: No Food Insecurity   Worried About Charity fundraiser in the Last Year: Never true   Ran Out of Food in the Last Year: Never true  Transportation Needs: Unmet Transportation Needs   Lack of Transportation (Medical): Not on file   Lack of Transportation (Non-Medical): Yes  Physical Activity: Inactive   Days of Exercise per Week: 0 days   Minutes of Exercise per Session: 0 min  Stress: Stress Concern Present   Feeling of  Stress : Rather much  Social Connections: Moderately Integrated   Frequency of Communication with Friends and Family: More than three times a week   Frequency of Social Gatherings with Friends and Family: Never   Attends Religious Services: 1 to 4 times per year   Active Member of [employer] or Organizations: No   Attends Archivist Meetings: Never   Marital Status: Married   Past Medical History:  Diagnosis Date   Abdominal pain 10/17/2020   Abnormality of gait due to impairment of balance 11/22/2020   Acute diverticulitis 10/17/2020   Admission for long-term opiate analgesic use 10/24/2019   Arthritis of right hip 05/29/2016   Formatting of this note might be different from the original. Added automatically from request for surgery [id number]   BMI 26.0-26.9,adult 03/29/2020   Cardiomyopathy ([place])    Overview:  Ejection fraction 45% in 2015 Ejection fraction 30 to 35% in November 2018   Chronic back pain    Chronic hypoxemic respiratory failure ([place]) 10/24/2019   Chronic narcotic use 03/23/2015   Chronic pain of both knees 12/21/2018   Added automatically from request for surgery [id number]  Formatting of this note might be different from the original. 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   Ileus following gastrointestinal surgery ([place]) 03/26/2015   Major depressive disorder, single episode, moderate ([place]) 10/24/2019   Malnutrition of moderate degree ([place]) 10/20/2020   Mesenteric ischemia ([place]) 10/16/2020   Migraine without aura with status migrainosus 10/24/2019   Mixed incontinence 10/20/2020   Nausea & vomiting    Nausea alone 04/19/2014   Obstructive chronic bronchitis with exacerbation ([place]) 10/25/2019   Other spondylosis with radiculopathy, lumbar region 10/24/2019   Persistent vomiting 05/17/2020   Presence of left artificial hip joint 10/24/2019   PTSD (post-traumatic stress disorder)    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  HENT:  Negative for congestion.   Eyes:  Negative for visual disturbance.  Respiratory:  Negative for chest tightness and shortness of breath.   Cardiovascular:  Negative for chest pain, palpitations and leg swelling.  Gastrointestinal:  Negative for abdominal distention and abdominal pain.  Genitourinary:  Negative for difficulty urinating.       Incontinence urine- they are seeing her with colonoscopy ? Ileal conduit?  Musculoskeletal:  Positive for back pain. Negative for arthralgias.   Neurological:  Positive for dizziness and headaches.  Psychiatric/Behavioral:  Positive for dysphoric mood.     Objective:  BP 114/70   Pulse 84   Temp 97.6 F (36.4 C)   Resp 15   Ht '5\' 3"'  (1.6 m)   Wt 122 lb (55.3 kg)   SpO2 97%   BMI 21.61 kg/m   BP/Weight 03/27/2021 03/15/2021 8/67/6195  Systolic BP 093 267 92  Diastolic BP 70 74 50  Wt. (Lbs) 122 122.25 118.8  BMI 21.61 21.66 21.04    Physical Exam  Lab Results  Component Value Date   WBC 10.3 02/15/2021   HGB 12.5 02/15/2021   HCT 37.5 02/15/2021   PLT 286 02/15/2021   GLUCOSE 141 (H) 02/15/2021   CHOL 178 02/15/2021   TRIG 102 02/15/2021   HDL 60 02/15/2021   LDLCALC 100 (H) 02/15/2021   ALT 7 02/15/2021   AST 15 02/15/2021   NA 141 02/15/2021   K 4.1 02/15/2021   CL 103 02/15/2021   CREATININE 1.19 (H) 02/15/2021   BUN 16 02/15/2021   CO2 24 02/15/2021   TSH 0.816 02/15/2021   INR 1.0 10/18/2020      Assessment & Plan:  Diagnoses and all orders for this visit: Wellness examination  Wellness review performed     These are the goals we discussed:  Goals      Learn More About My Health     Timeframe:  Long-Range Goal Priority:  High Start Date:       10/31/20                       Expected End Date:  10/31/2021                       Follow Up Date 05/01/2021    - tell my story and reason for my visit - repeat what I heard to make sure I understand - bring a list of my medicines to the visit - speak up when I don't understand    Why is this important?   The best way to learn about your health and care is by talking to the doctor and nurse.  They will answer your questions and give you information in the way that you like best.  Notes:      Lifestyle Change-Hypertension     Timeframe:  Long-Range Goal Priority:  High Start Date:     10/31/2020                        Expected End Date: 10/31/2021                      Follow Up Date 05/01/2021    - agree to work together to  make changes    Why is this important?   The changes that you are asked to make may be hard to do.  This is especially true when the changes are life-long.  Knowing why it is important to you is the first step.  Working on the change with your family or support person helps you not feel alone.  Reward yourself and family or support person when goals are met. This can be an activity you choose like bowling, hiking, biking, swimming or shooting hoops.     Notes:      Manage My Medicine     Timeframe:  Long-Range Goal Priority:  High Start Date:               10/31/20               Expected End Date:  10/31/2021                     Follow Up Date 05/01/2021    - call for medicine refill 2 or 3 days before it runs out - keep a list of all the medicines I take; vitamins and herbals too - use a pillbox to sort medicine    Why is this important?   These steps will help you keep on track with your medicines.   Notes:      Pharmacy Care Plan     CARE PLAN ENTRY  Current Barriers:  Chronic Disease Management support, education, and care coordination needs related to Hypertension and Hyperlipidemia   Heart Failure Pharmacist Clinical Goal(s): Over the next 90 days, patient will work with PharmD and providers to avoid heart failure exacerbation. Current regimen:  Entresto 24-26 mg bid  furosemide 40 mg daily and 1/2 tablet in the afternoon if needed carvedilol 3.125 mg bid Florinef 0.1 mg three times weekly to treat hypotension Interventions: Recommend patient continue to limit salt in diet to reduce swelling and keep good blood pressure control.  Recommend patient continue to limit fluid intake as advised by cardiologist.  Patient self care activities - Over the next 90 days, patient will: Check BP monthly, document, and provide at future appointments Ensure daily salt intake < 2300 mg/day  Hyperlipidemia Pharmacist Clinical Goal(s): Over the next 90 days, patient will work  with PharmD and providers to maintain LDL goal < 100 Current regimen:  Rosuvastatin 20 mg daily Interventions: Recommend patient take rosuvastatin as prescribed.  Recommend patient continue to incorporate vegetables and lean protein sources in diet.  Patient self care activities - Over the next 90 days, patient will: Take medications as prescribed, eat a heart-healthy diet and contact provider with any questions/concerns.   COPD Pharmacist Clinical Goal(s) Over the next 90 days, patient will work with PharmD and providers to reduce use of rescue inhaler by using maintenance inhaler daily.  Current regimen:   Breo Ellipta 100/26 mcg/inh 1 puff in lungs daily  Atrovent nebulizer solutions every 4 hours prn wheezing  or shortness of breath Interventions: Patient indicates good symptoms control currently.  Patient self care activities - Over the next 90 days, patient will: Use Breo daily to manage symptoms and contact provider as needed.   Medication management Pharmacist Clinical Goal(s): Over the next 90 days, patient will work with PharmD and providers to maintain optimal medication adherence Current pharmacy:[facility] Inter[facility] Comprehensive medication review performed. Utilize [facility] pharmacy for medication synchronization, packaging and delivery Patient self care activities - Over the next 90 days, patient will: Focus on medication adherence by enrolling in packaging and delivery service.  Take medications as prescribed Report any questions or concerns to PharmD and/or provider(s)  Please see past updates related to this goal by clicking on the "Past Updates" button in the selected goal           This is a list of the screening recommended for you and due dates:  Health Maintenance  Topic Date Due   COVID-19 Vaccine (1) Never done   Hepatitis C Screening: USPSTF Recommendation to screen - Ages 78-79 yo.  Never done   Tetanus Vaccine  Never done   Zoster (Shingles)  Vaccine (1 of 2) Never done   Mammogram  09/22/2020   Flu Shot  03/26/2021   Pneumonia vaccines (2 of 2 - PCV13) 06/02/2021   Colon Cancer Screening  10/15/2025   DEXA scan (bone density measurement)  Completed   HPV Vaccine  Aged Out     AN INDIVIDUALIZED CARE PLAN: was established or reinforced today.   SELF MANAGEMENT: The patient and I together assessed ways to personally work towards obtaining the recommended goals  Support needs The patient and/or family needs were assessed and services were offered and not necessary at this time.    Follow-up: Return if symptoms worsen or fail to improve.  Reinaldo Meeker, MD [facility] [phone]

## 2021-03-29 NOTE — Telephone Encounter (Signed)
If the dizziness is migraine-related, then there is no change in management as I am trying to improve her migraines.  Otherwise, I can refer her to vestibular rehab    Pt states the dizzines only happens around the time she has an Migraine.

## 2021-04-01 ENCOUNTER — Other Ambulatory Visit: Payer: Self-pay | Admitting: Neurology

## 2021-04-02 ENCOUNTER — Other Ambulatory Visit: Payer: Self-pay | Admitting: Legal Medicine

## 2021-04-19 ENCOUNTER — Encounter: Payer: Self-pay | Admitting: Gastroenterology

## 2021-04-20 ENCOUNTER — Other Ambulatory Visit: Payer: Self-pay

## 2021-04-20 ENCOUNTER — Telehealth: Payer: Self-pay

## 2021-04-20 DIAGNOSIS — E785 Hyperlipidemia, unspecified: Secondary | ICD-10-CM

## 2021-04-20 MED ORDER — FLUTICASONE FUROATE-VILANTEROL 100-25 MCG/INH IN AEPB: 1.0000 | INHALATION_SPRAY | Freq: Every day | RESPIRATORY_TRACT | 1 refills | Status: DC

## 2021-04-20 MED ORDER — ESOMEPRAZOLE MAGNESIUM 20 MG PO CPDR: 20.0000 mg | DELAYED_RELEASE_CAPSULE | Freq: Every day | ORAL | 2 refills | Status: DC

## 2021-04-20 MED ORDER — FUROSEMIDE 40 MG PO TABS: 40.0000 mg | ORAL_TABLET | Freq: Two times a day (BID) | ORAL | 2 refills | Status: DC

## 2021-04-20 MED ORDER — ROSUVASTATIN CALCIUM 20 MG PO TABS: 20.0000 mg | ORAL_TABLET | Freq: Every day | ORAL | 2 refills | Status: DC

## 2021-04-20 NOTE — Chronic Care Management (AMB) (Signed)
Chronic Care Management Pharmacy Assistant   Name: [name]  MRN: [id number] DOB: 11-15-1955  Reason for Encounter: Medication Coordination  Recent office visits:  03/27/21- Reinaldo Meeker, MD-seen for annual wellness visit, directions changed for furosemide from 40 mg every morning and 0.5 tabs in the evening to 40 mg twice daily, no follow up documented 02/15/21- Reinaldo Meeker, MD- seen for follow up of chronic conditions,  labs ordered, no medication changes, follow up 6 months  Recent consult visits:  03/16/21- Orders Only (Neurology)- Aimovig 140 mg/mL- inject 140 mg every 28 days 03/15/21- Metta Clines, DO (Neurology)- seen for chronic migraine without aura, plan to start CGRP inhibitor, follow up 6 months 16/60/63- Leighton Ruff, MD (Gastroenterology)- seen for follow up of diverticulitis, no medication changes, follow up after colonoscopy 02/22/21- Jenne Campus, MD( Cardiology)- seen for dizziness, zio monitor ordered, no medication changes,  follow up 3 months  Hospital visits:  None in previous 6 months  Medications: Outpatient Encounter Medications as of 04/20/2021  Medication Sig   aspirin 81 MG tablet Take 81 mg by mouth daily.   Buprenorphine HCl (BELBUCA) 900 MCG FILM Place 900 mcg inside cheek every 12 (twelve) hours.   carvedilol (COREG) 3.125 MG tablet Take 1 tablet (3.125 mg total) by mouth 2 (two) times daily with a meal.   celecoxib (CELEBREX) 200 MG capsule TAKE ONE CAPSULE BY MOUTH EVERY MORNING (Patient taking differently: Take 200 mg by mouth daily.)   cyclobenzaprine (FLEXERIL) 10 MG tablet Take 10 mg by mouth 3 (three) times daily as needed for muscle spasms.   denosumab (PROLIA) 60 MG/ML SOSY injection Inject 60 mg into the skin every 6 (six) months.   diazepam (VALIUM) 5 MG tablet Take 1 tablet (5 mg total) by mouth 3 (three) times daily as needed for anxiety.   diphenoxylate-atropine (LOMOTIL) 2.5-0.025 MG tablet Take 1 tablet by mouth 4 (four)  times daily as needed for diarrhea or loose stools.   ENTRESTO 24-26 MG TAKE ONE TABLET BY MOUTH TWICE DAILY (Patient taking differently: Take 1 tablet by mouth 2 (two) times daily.)   EPINEPHRINE 0.3 mg/0.3 mL IJ SOAJ injection Inject 0.3 mLs (0.3 mg total) into the muscle as needed for anaphylaxis.   Erenumab-aooe (AIMOVIG) 140 MG/ML SOAJ Inject 140 mg into the skin every 28 (twenty-eight) days.   esomeprazole (NEXIUM) 20 MG capsule Take 1 capsule (20 mg total) by mouth daily.   fludrocortisone (FLORINEF) 0.1 MG tablet TAKE 1 TABLET BY MOUTH AS DIRECTED. (Patient taking differently: Take 0.1 mg by mouth daily.)   fluticasone (FLONASE) 50 MCG/ACT nasal spray Place 2 sprays into both nostrils daily as needed (congestion). (Patient taking differently: Place 2 sprays into both nostrils daily as needed for allergies (congestion).)   fluticasone furoate-vilanterol (BREO ELLIPTA) 100-25 MCG/INH AEPB Inhale 1 puff into the lungs daily in the afternoon.   furosemide (LASIX) 20 MG tablet Take 40 mg by mouth 2 (two) times daily.   gabapentin (NEURONTIN) 800 MG tablet Take 1 tablet (800 mg total) by mouth 3 (three) times daily. Patient usually takes twice a day (Patient taking differently: Take 800 mg by mouth 2 (two) times daily.)   ipratropium (ATROVENT) 0.02 % nebulizer solution Take 2.5 mLs (0.5 mg total) by nebulization every 4 (four) hours as needed for wheezing or shortness of breath.   ipratropium-albuterol (DUONEB) 0.5-2.5 (3) MG/3ML SOLN Inhale 3 mLs into the lungs every 6 (six) hours as needed (sob).   levocetirizine (XYZAL) 5 MG tablet  Take 5 mg by mouth daily as needed for allergies.   LINZESS 145 MCG CAPS capsule Take 1 capsule (145 mcg total) by mouth daily as needed (indigestion).   Multiple Vitamin (MULTIVITAMIN WITH MINERALS) TABS tablet Take 1 tablet by mouth daily. (Patient taking differently: Take 1 tablet by mouth daily. Unknown strength)   naloxone (NARCAN) nasal spray 4 mg/0.1 mL Place 1  spray into the nose once.   nitroGLYCERIN (NITROSTAT) 0.4 MG SL tablet Place 1 tablet (0.4 mg total) under the tongue every 5 (five) minutes as needed for chest pain.   ondansetron (ZOFRAN) 4 MG tablet Take 1 tablet (4 mg total) by mouth every 8 (eight) hours as needed for nausea or vomiting.   Oxycodone HCl 10 MG TABS Take 1 tablet by mouth every 4 (four) hours as needed (pain).   ranolazine (RANEXA) 500 MG 12 hr tablet TAKE 1 TABLET(500 MG) BY MOUTH TWICE DAILY (Patient taking differently: Take 500 mg by mouth 2 (two) times daily. TAKE 1 TABLET(500 MG) BY MOUTH TWICE DAILY)   rosuvastatin (CRESTOR) 20 MG tablet TAKE ONE TABLET BY MOUTH EVERY MORNING (Patient taking differently: Take 20 mg by mouth daily.)   sertraline (ZOLOFT) 100 MG tablet Take 0.5 tablets (50 mg total) by mouth daily.   tiZANidine (ZANAFLEX) 4 MG tablet Take 4 mg by mouth at bedtime.   topiramate (TOPAMAX) 50 MG tablet Take 2 tablets (100 mg total) by mouth at bedtime.   UBRELVY 100 MG TABS TAKE 1 TABLET BY MOUTH BY MOUTH AS NEEDED( MAY REPEAT 1 TABLET AFTER 2 HOURS IF NEEDED, MAXIMUM 2 TABLETS IN 24 HOURS) (Patient taking differently: Take 1 tablet by mouth as directed. Take 1 tab po as needed, may repeat 1 tab after 2 hour as needed max 2 tabs in 24 hours)   No facility-administered encounter medications on file as of 04/20/2021.    Reviewed chart for medication changes ahead of medication coordination call.  No OVs, Consults, or hospital visits since last care coordination call/Pharmacist visit. (If appropriate, list visit date, provider name)  No medication changes indicated OR if recent visit, treatment plan here.  BP Readings from Last 3 Encounters:  03/27/21 114/70  03/15/21 111/74  02/22/21 (!) 92/50    No results found for: HGBA1C   Patient obtains medications through Vials  90 Days   Last adherence delivery included:   Celebrex Entresto Rosuvastatin Esomeprazole Topiramate Breo Diazepam Sertraline Linzess  Patient declined Furosemide last month due to recent fill  Patient is due for next adherence delivery on: 05/01/21. Called patient and reviewed medications and coordinated delivery.  This delivery to include: Celebrex 200 mg Entresto 24-26 mg Rosuvastatin 20 mg Esomeprazole 20 mg Breo 100-25 mcg/inh Sertraline 100 mg Aimovig 140 mg/ ml Furosemide 40 mg  Patient declined the following medications  due to abundance Linzess Topiramate  Patient needs refills for . Rosuvastatin 20 mg Esomeprazole 20 mg Breo 100-25 mcg/inh Furosemide 40 mg  Confirmed delivery date of 05/01/21, advised patient that pharmacy will contact them the morning of delivery.  [employer] CPA, CMA

## 2021-04-21 ENCOUNTER — Other Ambulatory Visit: Payer: Self-pay | Admitting: Legal Medicine

## 2021-04-21 DIAGNOSIS — E785 Hyperlipidemia, unspecified: Secondary | ICD-10-CM

## 2021-04-21 DIAGNOSIS — I5022 Chronic systolic (congestive) heart failure: Secondary | ICD-10-CM

## 2021-04-27 ENCOUNTER — Telehealth: Payer: Self-pay

## 2021-04-27 NOTE — Chronic Care Management (AMB) (Cosign Needed)
Chronic Care Management Pharmacy Assistant   Name: [name]  MRN: [id number] DOB: Sep 09, 1955  Reason for Encounter: General Adherence Call  Recent office visits:  No visits noted  Recent consult visits:  No visits noted  Hospital visits:  None in previous 6 months  Medications: Outpatient Encounter Medications as of 04/27/2021  Medication Sig   aspirin 81 MG tablet Take 81 mg by mouth daily.   BREO ELLIPTA 100-25 MCG/INH AEPB INHALE 1 PUFF BY MOUTH INTO LUNGS DAILY in THE afternoon   Buprenorphine HCl (BELBUCA) 900 MCG FILM Place 900 mcg inside cheek every 12 (twelve) hours.   carvedilol (COREG) 3.125 MG tablet Take 1 tablet (3.125 mg total) by mouth 2 (two) times daily with a meal.   celecoxib (CELEBREX) 200 MG capsule TAKE ONE CAPSULE BY MOUTH EVERY MORNING (Patient taking differently: Take 200 mg by mouth daily.)   cyclobenzaprine (FLEXERIL) 10 MG tablet Take 10 mg by mouth 3 (three) times daily as needed for muscle spasms.   denosumab (PROLIA) 60 MG/ML SOSY injection Inject 60 mg into the skin every 6 (six) months.   diazepam (VALIUM) 5 MG tablet Take 1 tablet (5 mg total) by mouth 3 (three) times daily as needed for anxiety.   diphenoxylate-atropine (LOMOTIL) 2.5-0.025 MG tablet Take 1 tablet by mouth 4 (four) times daily as needed for diarrhea or loose stools.   ENTRESTO 24-26 MG TAKE ONE TABLET BY MOUTH TWICE DAILY (Patient taking differently: Take 1 tablet by mouth 2 (two) times daily.)   EPINEPHRINE 0.3 mg/0.3 mL IJ SOAJ injection Inject 0.3 mLs (0.3 mg total) into the muscle as needed for anaphylaxis.   Erenumab-aooe (AIMOVIG) 140 MG/ML SOAJ Inject 140 mg into the skin every 28 (twenty-eight) days.   esomeprazole (NEXIUM) 20 MG capsule TAKE ONE CAPSULE BY MOUTH ONCE DAILY   fludrocortisone (FLORINEF) 0.1 MG tablet TAKE 1 TABLET BY MOUTH AS DIRECTED. (Patient taking differently: Take 0.1 mg by mouth daily.)   fluticasone (FLONASE) 50 MCG/ACT nasal spray Place 2  sprays into both nostrils daily as needed (congestion). (Patient taking differently: Place 2 sprays into both nostrils daily as needed for allergies (congestion).)   furosemide (LASIX) 40 MG tablet TAKE ONE TABLET BY MOUTH EVERY MORNING MAY take 0.5 tablet in THE afternoon IF needed]   gabapentin (NEURONTIN) 800 MG tablet Take 1 tablet (800 mg total) by mouth 3 (three) times daily. Patient usually takes twice a day (Patient taking differently: Take 800 mg by mouth 2 (two) times daily.)   ipratropium (ATROVENT) 0.02 % nebulizer solution Take 2.5 mLs (0.5 mg total) by nebulization every 4 (four) hours as needed for wheezing or shortness of breath.   ipratropium-albuterol (DUONEB) 0.5-2.5 (3) MG/3ML SOLN Inhale 3 mLs into the lungs every 6 (six) hours as needed (sob).   levocetirizine (XYZAL) 5 MG tablet Take 5 mg by mouth daily as needed for allergies.   LINZESS 145 MCG CAPS capsule Take 1 capsule (145 mcg total) by mouth daily as needed (indigestion).   Multiple Vitamin (MULTIVITAMIN WITH MINERALS) TABS tablet Take 1 tablet by mouth daily. (Patient taking differently: Take 1 tablet by mouth daily. Unknown strength)   naloxone (NARCAN) nasal spray 4 mg/0.1 mL Place 1 spray into the nose once.   nitroGLYCERIN (NITROSTAT) 0.4 MG SL tablet Place 1 tablet (0.4 mg total) under the tongue every 5 (five) minutes as needed for chest pain.   ondansetron (ZOFRAN) 4 MG tablet Take 1 tablet (4 mg total) by mouth  every 8 (eight) hours as needed for nausea or vomiting.   Oxycodone HCl 10 MG TABS Take 1 tablet by mouth every 4 (four) hours as needed (pain).   ranolazine (RANEXA) 500 MG 12 hr tablet TAKE 1 TABLET(500 MG) BY MOUTH TWICE DAILY (Patient taking differently: Take 500 mg by mouth 2 (two) times daily. TAKE 1 TABLET(500 MG) BY MOUTH TWICE DAILY)   rosuvastatin (CRESTOR) 20 MG tablet TAKE ONE TABLET BY MOUTH EVERY MORNING   sertraline (ZOLOFT) 100 MG tablet Take 0.5 tablets (50 mg total) by mouth daily.    tiZANidine (ZANAFLEX) 4 MG tablet Take 4 mg by mouth at bedtime.   topiramate (TOPAMAX) 50 MG tablet Take 2 tablets (100 mg total) by mouth at bedtime.   UBRELVY 100 MG TABS TAKE 1 TABLET BY MOUTH BY MOUTH AS NEEDED( MAY REPEAT 1 TABLET AFTER 2 HOURS IF NEEDED, MAXIMUM 2 TABLETS IN 24 HOURS) (Patient taking differently: Take 1 tablet by mouth as directed. Take 1 tab po as needed, may repeat 1 tab after 2 hour as needed max 2 tabs in 24 hours)   No facility-administered encounter medications on file as of 04/27/2021.    Contacted [facility] for general disease state and medication adherence call.   Patient is not > 5 days past due for refill on the following medications per chart history:  [facility] Medications: Medication Name/mg Last Fill Days Supply Rosuvastatin 20 mg  01/31/21 90 DS  What concerns do you have about your medications? Patient stated she is still experiencing headaches and dizzy spells after taking a shot of aimovig for the first time in the past two months. Patient also stated that her oxycodone was increased in august from 10 mg to 15 mg. She reports not feeling any relief in her back and leg pain.  The patient denies side effects with her medications.   How often do you forget or accidentally miss a dose?  Patient stated she never forgets a dose  Do you use a pillbox? No  Are you having any problems getting your medications from your pharmacy? No  Has the cost of your medications been a concern? No  Since last visit with CPP, no interventions have been made:   The patient has not had an ED visit since last contact.   The patient reports the following problems with their health.  Patient stated she has been having consistent dizzy spells, weakness, palpitations, and headaches. She observes this mostly in the morning and when bending over. She has not taken her BP in any of these instances so I encouraged her to take note so we may know if this has any indication.    she reports the following  concerns or questions for Sherre Poot, at this time. She would like to know of any recommendations for creams or gels that can be prescribed to help with her leg and back pain.   Patient stated she does not have much of an appetite so she eats small snacks during the day and has a meal for supper. She mainly has chicken, broccoli, and green beans with a cup of coffee daily.  Counseled patient on:   [place] job taking medications!   Importance of taking medication daily without missed doses  Benefits of adherence packaging or a pillbox   Access to CCM team for any cost, medication, or pharmacy concerns  Care Gaps: Last annual wellness visit? 75/10/25 If applicable: N/A  Last eye exam / retinopathy screening?  Diabetic foot exam?  CPP phone visit scheduled for 07/16/21 at 1:45 pm  [employer] CPA, CMA

## 2021-05-01 ENCOUNTER — Telehealth: Payer: 59

## 2021-05-01 ENCOUNTER — Other Ambulatory Visit: Payer: Self-pay | Admitting: Legal Medicine

## 2021-05-01 DIAGNOSIS — M1611 Unilateral primary osteoarthritis, right hip: Secondary | ICD-10-CM

## 2021-05-01 MED ORDER — DICLOFENAC SODIUM 1 % EX GEL: 2.0000 g | Freq: Four times a day (QID) | CUTANEOUS | 4 refills | Status: DC

## 2021-05-08 ENCOUNTER — Telehealth: Payer: Self-pay

## 2021-05-08 NOTE — Telephone Encounter (Signed)
Patient called stating that [place] [facility] needs paperwork that they sent over signed and faxed back. Without this paperwork they can not deliver patients [facility].

## 2021-05-08 NOTE — Telephone Encounter (Signed)
Sent in yesterday and again this morning lp

## 2021-05-22 ENCOUNTER — Encounter: Payer: Self-pay | Admitting: Legal Medicine

## 2021-05-22 ENCOUNTER — Ambulatory Visit: Payer: 59 | Admitting: Legal Medicine

## 2021-05-22 ENCOUNTER — Ambulatory Visit (INDEPENDENT_AMBULATORY_CARE_PROVIDER_SITE_OTHER): Payer: 59 | Admitting: Legal Medicine

## 2021-05-22 ENCOUNTER — Other Ambulatory Visit: Payer: Self-pay

## 2021-05-22 VITALS — BP 132/78 | HR 86 | Temp 97.6°F | Ht 60.0 in | Wt 115.8 lb

## 2021-05-22 DIAGNOSIS — Z95810 Presence of automatic (implantable) cardiac defibrillator: Secondary | ICD-10-CM

## 2021-05-22 DIAGNOSIS — E2740 Unspecified adrenocortical insufficiency: Secondary | ICD-10-CM

## 2021-05-22 DIAGNOSIS — F321 Major depressive disorder, single episode, moderate: Secondary | ICD-10-CM

## 2021-05-22 DIAGNOSIS — K219 Gastro-esophageal reflux disease without esophagitis: Secondary | ICD-10-CM

## 2021-05-22 DIAGNOSIS — Z1231 Encounter for screening mammogram for malignant neoplasm of breast: Secondary | ICD-10-CM

## 2021-05-22 DIAGNOSIS — I2510 Atherosclerotic heart disease of native coronary artery without angina pectoris: Secondary | ICD-10-CM

## 2021-05-22 DIAGNOSIS — I5022 Chronic systolic (congestive) heart failure: Secondary | ICD-10-CM

## 2021-05-22 DIAGNOSIS — G43001 Migraine without aura, not intractable, with status migrainosus: Secondary | ICD-10-CM

## 2021-05-22 DIAGNOSIS — E440 Moderate protein-calorie malnutrition: Secondary | ICD-10-CM

## 2021-05-22 DIAGNOSIS — J449 Chronic obstructive pulmonary disease, unspecified: Secondary | ICD-10-CM

## 2021-05-22 DIAGNOSIS — E782 Mixed hyperlipidemia: Secondary | ICD-10-CM

## 2021-05-22 DIAGNOSIS — E785 Hyperlipidemia, unspecified: Secondary | ICD-10-CM

## 2021-05-22 DIAGNOSIS — Z23 Encounter for immunization: Secondary | ICD-10-CM

## 2021-05-22 DIAGNOSIS — K5909 Other constipation: Secondary | ICD-10-CM

## 2021-05-22 DIAGNOSIS — I10 Essential (primary) hypertension: Secondary | ICD-10-CM

## 2021-05-22 DIAGNOSIS — J9611 Chronic respiratory failure with hypoxia: Secondary | ICD-10-CM

## 2021-05-22 DIAGNOSIS — F1190 Opioid use, unspecified, uncomplicated: Secondary | ICD-10-CM

## 2021-05-22 MED ORDER — MEGESTROL ACETATE 20 MG PO TABS: 20.0000 mg | ORAL_TABLET | Freq: Every day | ORAL | 3 refills | Status: DC

## 2021-05-22 MED ORDER — LINACLOTIDE 290 MCG PO CAPS: 290.0000 ug | ORAL_CAPSULE | Freq: Every day | ORAL | 2 refills | Status: DC

## 2021-05-22 MED ORDER — ARIPIPRAZOLE 2 MG PO TABS: 2.0000 mg | ORAL_TABLET | Freq: Every day | ORAL | 3 refills | Status: DC

## 2021-05-22 NOTE — Progress Notes (Signed)
Established Patient Office Visit  Subjective:  Patient ID: [name], female    DOB: [birth year]  Age: 65 y.o. MRN: [id number]  CC:  Chief Complaint  Patient presents with   Gastroesophageal Reflux   Hyperlipidemia   Hypertension    HPI [name] presents for chronic visit  Patient presents for follow up of hypertension.  Patient tolerating carvidilol, valsartan well with side effects.  Patient was diagnosed with hypertension 2010 so has been treated for hypertension for 10 years.Patient is working on maintaining diet and exercise regimen and follows up as directed. Complication include cad, chf.   Patient presents with hyperlipidemia.  Compliance with treatment has been good; patient takes medicines as directed, maintains low cholesterol diet, follows up as directed, and maintains exercise regimen.  Patient is using crestor without problems.   This patient has major depressin for years.  PHQ9 =10.  Patient is having more anhedonia.  The patient has less future plans and prospects.  The depression is worse with death of sister.  The patient is not exercising and working on behavior to improve mental health.  Patient is not seeing a therapist or psychiatrist.  na  Patient is on zoloft.   Patient presents with HFrEF  that is stable. Diagnosis made 2019.  The course of the disease is stable.  Current medicines include entresto and furosemide. Patient follows a low cholesterol diet and maintains a weight diary.  Patient is on low salt, low cholesterol diet and avoids alcohol.  Patient denies adverse effects of medicines. Patient is monitoring weight and has lost 5 lbs of weight.  Patient is having no pedal edema, no PND and no PND.  Patient is continuing to see cardiology.    Past Medical History:  Diagnosis Date   Abdominal pain 10/17/2020   Abnormality of gait due to impairment of balance 11/22/2020   Acute diverticulitis 10/17/2020   Admission for long-term opiate analgesic use  10/24/2019   Arthritis of right hip 05/29/2016   Formatting of this note might be different from the original. Added automatically from request for surgery [id number]   BMI 26.0-26.9,adult 03/29/2020   Cardiomyopathy ([place])    Overview:  Ejection fraction 45% in 2015 Ejection fraction 30 to 35% in November 2018   Chronic back pain    Chronic hypoxemic respiratory failure ([place]) 10/24/2019   Chronic narcotic use 03/23/2015   Chronic pain of both knees 12/21/2018   Added automatically from request for surgery [id number]  Formatting of this note might be different from the original. 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   Cystitis 05/17/2020   Dehydration 05/17/2020   Diarrhea 05/17/2020   Dilated cardiomyopathy (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   Dyslipidemia 05/31/2015   Essential hypertension 05/31/2015   GERD (gastroesophageal reflux disease)    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[place]) 07/13/2020   Hypotension 05/17/2020   ICD (implantable cardiac defibrillator) in place 2012   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   Ileus following gastrointestinal surgery ([place]) 03/26/2015   Major depressive disorder, single episode, moderate ([place]) 10/24/2019  Malnutrition of moderate degree ([place]) 10/20/2020   Mesenteric ischemia ([place]) 10/16/2020   Migraine without aura with status migrainosus 10/24/2019   Mixed incontinence 10/20/2020   Nausea & vomiting    Nausea alone 04/19/2014   Obstructive chronic bronchitis with exacerbation  ([place]) 10/25/2019   Other spondylosis with radiculopathy, lumbar region 10/24/2019   Persistent vomiting 05/17/2020   Presence of left artificial hip joint 10/24/2019   PTSD (post-traumatic stress disorder)    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 03/23/2015 04/19/2014   Senile osteoporosis 10/24/2019   Sepsis ([place]) 10/17/2020   Thyroid disease     Past Surgical History:  Procedure Laterality Date   APPENDECTOMY     CARDIAC CATHETERIZATION     CARPAL TUNNEL RELEASE     CESAREAN SECTION     CHOLECYSTECTOMY     CORONARY ANGIOPLASTY     HAND SURGERY     ICD GENERATOR CHANGEOUT N/A 05/21/2019   Procedure: ICD GENERATOR CHANGEOUT;  Surgeon: Constance Haw, MD;  Location: [facility];  Service: Cardiovascular;  Laterality: N/A;   ICD IMPLANT     Medtronic   intestinal blockage 2011     LAPAROSCOPIC ASSISTED VENTRAL HERNIA REPAIR N/A 03/23/2015   Procedure: LAPAROSCOPIC VENTRAL WALL HERNIA REPAIR;  Surgeon: Michael Boston, MD;  Location: [facility];  Service: General;  Laterality: N/A;  With MESH   LAPAROSCOPIC LYSIS OF ADHESIONS N/A 03/23/2015   Procedure: LAPAROSCOPIC LYSIS OF ADHESIONS;  Surgeon: Michael Boston, MD;  Location: [facility];  Service: General;  Laterality: N/A;   NECK SURGERY     fused   TONSILLECTOMY     ULNAR NERVE TRANSPOSITION  01/23/2012   Procedure: ULNAR NERVE DECOMPRESSION/TRANSPOSITION;  Surgeon: Ophelia Charter, MD;  Location: [facility];  Service: Neurosurgery;  Laterality: Left;  LEFT ulnar nerve decompression    Family History  Problem Relation Age of Onset   Heart attack Other    Cancer Other    Heart failure Other    Hypertension Mother    Heart attack Mother    Alcohol abuse Father    Hypertension Father    Heart attack Father    Anesthesia problems Neg Hx    Hypotension Neg Hx    Malignant hyperthermia Neg Hx    Pseudochol deficiency Neg Hx     Social History   Socioeconomic History   Marital status: Single     Spouse name: Not on file   Number of children: 3   Years of education: Not on file   Highest education level: Not on file  Occupational History   Occupation: disabled  Tobacco Use   Smoking status: Former    Packs/day: 0.50    Years: 30.00    Pack years: 15.00    Types: Cigarettes    Quit date: 03/2020    Years since quitting: 1.1   Smokeless tobacco: Never  Vaping Use   Vaping Use: Never used  Substance and Sexual Activity   Alcohol use: Yes    Alcohol/week: 2.0 standard drinks    Types: 1 Cans of beer, 1 Standard drinks or equivalent per week    Comment: seldom   Drug use: No   Sexual activity: Not Currently  Other Topics Concern   Not on file  Social History Narrative   One level home with boyfriend   Caffeine - coffee 1-2 cups/day; Green tea 4-5 bottles a day   Exercise - some    Right handed  Social Determinants of Health   Financial Resource Strain: Low Risk    Difficulty of Paying Living Expenses: Not hard at all  Food Insecurity: No Food Insecurity   Worried About Charity fundraiser in the Last Year: Never true   Arboriculturist in the Last Year: Never true  Transportation Needs: Unmet Transportation Needs   Lack of Transportation (Medical): Not on file   Lack of Transportation (Non-Medical): Yes  Physical Activity: Inactive   Days of Exercise per Week: 0 days   Minutes of Exercise per Session: 0 min  Stress: Stress Concern Present   Feeling of Stress : Rather much  Social Connections: Moderately Integrated   Frequency of Communication with Friends and Family: More than three times a week   Frequency of Social Gatherings with Friends and Family: Never   Attends Religious Services: 1 to 4 times per year   Active Member of [employer] or Organizations: No   Attends Archivist Meetings: Never   Marital Status: Married  Human resources officer Violence: Unknown   Fear of Current or Ex-Partner: No   Emotionally Abused: No   Physically Abused: No    Sexually Abused: Not on file    Outpatient Medications Prior to Visit  Medication Sig Dispense Refill   aspirin 81 MG tablet Take 81 mg by mouth daily.     BREO ELLIPTA 100-25 MCG/INH AEPB INHALE 1 PUFF BY MOUTH INTO LUNGS DAILY in THE afternoon 180 each 1   Buprenorphine HCl (BELBUCA) 900 MCG FILM Place 900 mcg inside cheek every 12 (twelve) hours.     carvedilol (COREG) 3.125 MG tablet Take 1 tablet (3.125 mg total) by mouth 2 (two) times daily with a meal. 180 tablet 2   celecoxib (CELEBREX) 200 MG capsule TAKE ONE CAPSULE BY MOUTH EVERY MORNING (Patient taking differently: Take 200 mg by mouth daily.) 90 capsule 2   cyclobenzaprine (FLEXERIL) 10 MG tablet Take 10 mg by mouth 3 (three) times daily as needed for muscle spasms.     denosumab (PROLIA) 60 MG/ML SOSY injection Inject 60 mg into the skin every 6 (six) months. 180 mL 2   diazepam (VALIUM) 5 MG tablet Take 1 tablet (5 mg total) by mouth 3 (three) times daily as needed for anxiety. 90 tablet 3   diclofenac Sodium (VOLTAREN) 1 % GEL Apply 2 g topically 4 (four) times daily. 50 g 4   diphenoxylate-atropine (LOMOTIL) 2.5-0.025 MG tablet Take 1 tablet by mouth 4 (four) times daily as needed for diarrhea or loose stools. 30 tablet 0   ENTRESTO 24-26 MG TAKE ONE TABLET BY MOUTH TWICE DAILY (Patient taking differently: Take 1 tablet by mouth 2 (two) times daily.) 180 tablet 2   EPINEPHRINE 0.3 mg/0.3 mL IJ SOAJ injection Inject 0.3 mLs (0.3 mg total) into the muscle as needed for anaphylaxis. 1 each 2   Erenumab-aooe (AIMOVIG) 140 MG/ML SOAJ Inject 140 mg into the skin every 28 (twenty-eight) days. 1.12 mL 5   esomeprazole (NEXIUM) 20 MG capsule TAKE ONE CAPSULE BY MOUTH ONCE DAILY 90 capsule 2   fludrocortisone (FLORINEF) 0.1 MG tablet TAKE 1 TABLET BY MOUTH AS DIRECTED. (Patient taking differently: Take 0.1 mg by mouth daily.) 30 tablet 3   fluticasone (FLONASE) 50 MCG/ACT nasal spray Place 2 sprays into both nostrils daily as needed  (congestion). (Patient taking differently: Place 2 sprays into both nostrils daily as needed for allergies (congestion).) 18 mL 2   furosemide (LASIX) 40 MG tablet TAKE  ONE TABLET BY MOUTH EVERY MORNING MAY take 0.5 tablet in THE afternoon IF needed] 90 tablet 2   gabapentin (NEURONTIN) 800 MG tablet Take 1 tablet (800 mg total) by mouth 3 (three) times daily. Patient usually takes twice a day (Patient taking differently: Take 800 mg by mouth 2 (two) times daily.) 270 tablet 2   ipratropium (ATROVENT) 0.02 % nebulizer solution Take 2.5 mLs (0.5 mg total) by nebulization every 4 (four) hours as needed for wheezing or shortness of breath. 150 mL 2   ipratropium-albuterol (DUONEB) 0.5-2.5 (3) MG/3ML SOLN Inhale 3 mLs into the lungs every 6 (six) hours as needed (sob). 270 mL 3   levocetirizine (XYZAL) 5 MG tablet Take 5 mg by mouth daily as needed for allergies.     LINZESS 145 MCG CAPS capsule Take 1 capsule (145 mcg total) by mouth daily as needed (indigestion). 90 capsule 2   Multiple Vitamin (MULTIVITAMIN WITH MINERALS) TABS tablet Take 1 tablet by mouth daily. (Patient taking differently: Take 1 tablet by mouth daily. Unknown strength)     naloxone (NARCAN) nasal spray 4 mg/0.1 mL Place 1 spray into the nose once.     nitroGLYCERIN (NITROSTAT) 0.4 MG SL tablet Place 1 tablet (0.4 mg total) under the tongue every 5 (five) minutes as needed for chest pain. 25 tablet 6   ondansetron (ZOFRAN) 4 MG tablet Take 1 tablet (4 mg total) by mouth every 8 (eight) hours as needed for nausea or vomiting. 20 tablet 0   Oxycodone HCl 10 MG TABS Take 1 tablet by mouth every 4 (four) hours as needed (pain).     ranolazine (RANEXA) 500 MG 12 hr tablet TAKE 1 TABLET(500 MG) BY MOUTH TWICE DAILY (Patient taking differently: Take 500 mg by mouth 2 (two) times daily. TAKE 1 TABLET(500 MG) BY MOUTH TWICE DAILY) 180 tablet 2   rosuvastatin (CRESTOR) 20 MG tablet TAKE ONE TABLET BY MOUTH EVERY MORNING 90 tablet 2   sertraline  (ZOLOFT) 100 MG tablet Take 0.5 tablets (50 mg total) by mouth daily. 90 tablet 2   tiZANidine (ZANAFLEX) 4 MG tablet Take 4 mg by mouth at bedtime.     topiramate (TOPAMAX) 50 MG tablet Take 2 tablets (100 mg total) by mouth at bedtime. 240 tablet 2   UBRELVY 100 MG TABS TAKE 1 TABLET BY MOUTH BY MOUTH AS NEEDED( MAY REPEAT 1 TABLET AFTER 2 HOURS IF NEEDED, MAXIMUM 2 TABLETS IN 24 HOURS) (Patient taking differently: Take 1 tablet by mouth as directed. Take 1 tab po as needed, may repeat 1 tab after 2 hour as needed max 2 tabs in 24 hours) 16 tablet 3   No facility-administered medications prior to visit.    No Known Allergies  ROS Review of Systems  Constitutional:  Negative for activity change and appetite change.  HENT:  Negative for congestion.   Eyes:  Negative for visual disturbance.  Respiratory:  Negative for choking and shortness of breath.   Cardiovascular:  Negative for chest pain, palpitations and leg swelling.  Gastrointestinal:  Negative for abdominal distention and abdominal pain.  Endocrine: Negative for polyuria.  Genitourinary:  Negative for dysuria.  Musculoskeletal:  Negative for arthralgias (diffuse) and back pain.  Neurological: Negative.   Psychiatric/Behavioral:  Positive for decreased concentration and dysphoric mood.      Objective:    Physical Exam Vitals reviewed.  Constitutional:      General: She is in acute distress.     Appearance: Normal appearance.  HENT:  Right Ear: Tympanic membrane normal.     Left Ear: Tympanic membrane normal.  Cardiovascular:     Rate and Rhythm: Regular rhythm.  Neurological:     Mental Status: She is alert.    BP 132/78   Pulse 86   Temp 97.6 F (36.4 C)   Ht 5' (1.524 m)   Wt 115 lb 12.8 oz (52.5 kg)   SpO2 97%   BMI 22.62 kg/m  Wt Readings from Last 3 Encounters:  05/22/21 115 lb 12.8 oz (52.5 kg)  03/27/21 122 lb (55.3 kg)  03/15/21 122 lb 4 oz (55.5 kg)   Depression screen [facility] 2/9 05/22/2021  03/27/2021 02/15/2021 08/11/2020 07/05/2020  Decreased Interest '1 2 2 2 2  ' Down, Depressed, Hopeless 0 '2 3 2 2  ' PHQ - 2 Score '1 4 5 4 4  ' Altered sleeping '3 3 3 3 3  ' Tired, decreased energy '3 3 2 3 3  ' Change in appetite 3 3 - 3 3  Feeling bad or failure about yourself  0 0 '2 3 2  ' Trouble concentrating 0 '3 3 2 2  ' Moving slowly or fidgety/restless 0 '2 2 3 3  ' Suicidal thoughts 0 0 0 2 0  PHQ-9 Score '10 18 17 23 20  ' Difficult doing work/chores Not difficult at all Very difficult Very difficult Somewhat difficult Not difficult at all      Health Maintenance Due  Topic Date Due   COVID-19 Vaccine (1) Never done   Hepatitis C Screening  Never done   TETANUS/TDAP  Never done   Zoster Vaccines- Shingrix (1 of 2) Never done   MAMMOGRAM  09/22/2020    There are no preventive care reminders to display for this patient.  Lab Results  Component Value Date   TSH 0.816 02/15/2021   Lab Results  Component Value Date   WBC 10.3 02/15/2021   HGB 12.5 02/15/2021   HCT 37.5 02/15/2021   MCV 92 02/15/2021   PLT 286 02/15/2021   Lab Results  Component Value Date   NA 141 02/15/2021   K 4.1 02/15/2021   CO2 24 02/15/2021   GLUCOSE 141 (H) 02/15/2021   BUN 16 02/15/2021   CREATININE 1.19 (H) 02/15/2021   BILITOT 0.3 02/15/2021   ALKPHOS 73 02/15/2021   AST 15 02/15/2021   ALT 7 02/15/2021   PROT 6.4 02/15/2021   ALBUMIN 4.1 02/15/2021   CALCIUM 10.1 02/15/2021   ANIONGAP 6 10/29/2020   EGFR 50 (L) 02/15/2021   Lab Results  Component Value Date   CHOL 178 02/15/2021   Lab Results  Component Value Date   HDL 60 02/15/2021   Lab Results  Component Value Date   LDLCALC 100 (H) 02/15/2021   Lab Results  Component Value Date   TRIG 102 02/15/2021   Lab Results  Component Value Date   CHOLHDL 3.0 02/15/2021   No results found for: HGBA1C    Assessment & Plan:   Diagnoses and all orders for this visit: Gastroesophageal reflux disease, unspecified whether esophagitis  present Plan of care was formulated today.  She is doing better.  A plan of care was formulated using patient exam, tests and other sources to optimize care using evidence based information.  Recommend no smoking, no eating after supper, avoid fatty foods, elevate Head of bed, avoid tight fitting clothing.  Continue on OTC.   Essential hypertension -     CBC with Differential -     Comprehensive metabolic panel An  individual hypertension care plan was established and reinforced today.  The patient's status was assessed using clinical findings on exam and labs or diagnostic tests. The patient's success at meeting treatment goals on disease specific evidence-based guidelines and found to be well controlled. SELF MANAGEMENT: The patient and I together assessed ways to personally work towards obtaining the recommended goals. RECOMMENDATIONS: avoid decongestants found in common cold remedies, decrease consumption of alcohol, perform routine monitoring of BP with home BP cuff, exercise, reduction of dietary salt, take medicines as prescribed, try not to miss doses and quit smoking.  Regular exercise and maintaining a healthy weight is needed.  Stress reduction may help. A CLINICAL SUMMARY including written plan identify barriers to care unique to individual due to social or financial issues.  We attempt to mutually creat solutions for individual and family understanding.   Mixed hyperlipidemia -     Lipid panel AN INDIVIDUAL CARE PLAN for hyperlipidemia/ cholesterol was established and reinforced today.  The patient's status was assessed using clinical findings on exam, lab and other diagnostic tests. The patient's disease status was assessed based on evidence-based guidelines and found to be fair controlled. MEDICATIONS were reviewed. SELF MANAGEMENT GOALS have been discussed and patient's success at attaining the goal of low cholesterol was assessed. RECOMMENDATION given include regular exercise 3 days a  week and low cholesterol/low fat diet. CLINICAL SUMMARY including written plan to identify barriers unique to the patient due to social or economic  reasons was discussed.   Chronic systolic congestive heart failure, NYHA class 2 ([place]) An individualized care plan was established and reinforced.  The patient's disease status was assessed using clinical finding son exam today, labs, and/or other diagnostic testing such as x-rays, to determine the patient's success in meeting treatmentgoalsbased on disease-based guidelines and found to beimproving. But not at goal yet. Medications prescriptions nochanges Laboratory tests ordered to be performed today include bnp. RECOMMENDATIONS: given include see cardiology.  Call physician is patient gains 3 lbs in one day or 5 lbs for one week.  Call for progressive PND, orthopnea or increased pedal edema.   Coronary artery disease involving native coronary artery of native heart without angina pectoris An individual plan was formulated based on patient history and exam, labs and evidence based data. Patient has not had recent angina or nitroglycerin use. continue present treatment.   Migraine without aura and with status migrainosus, not intractable AN INDIVIDUAL CARE PLAN for migrINE was established and reinforced today.  The patient's status was assessed using clinical findings on exam, labs, and other diagnostic testing. Patient's success at meeting treatment goals based on disease specific evidence-bassed guidelines and found to be in good control. RECOMMENDATIONS include maintaining present medicines and treatment.   Chronic hypoxemic respiratory failure (HCC) AN INDIVIDUAL CARE PLAN chronic respiratory failure was established and reinforced today.  The patient's status was assessed using clinical findings on exam, labs, and other diagnostic testing. Patient's success at meeting treatment goals based on disease specific evidence-bassed guidelines and found to  be in good control. RECOMMENDATIONS include maintaining present medicines and treatment.   Chronic obstructive pulmonary disease, unspecified COPD type ([place]) An individualize plan was formulated for care of COPD.  Treatment is evidence based.  She will continue on inhalers, avoid smoking and smoke.  Regular exercise with help with dyspnea. Routine follow ups and medication compliance is needed.   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[place]) Patient is doing fair on flourinef  Chronic narcotic use AN INDIVIDUAL CARE PLAN was established and reinforced  today.  The patient's status was assessed using clinical findings on exam, labs, and other diagnostic testing. Patient's success at meeting treatment goals based on disease specific evidence-bassed guidelines and found to be in fair control. RECOMMENDATIONS include maintining present medicines and treatment. He is on chroni coxycodone and butrans with no abuse.  Negative REMS. She was changed to suboxone and having poor pain control and she is to call pain medicine.  Dyslipidemia AN INDIVIDUAL CARE PLAN for hyperlipidemia/ cholesterol was established and reinforced today.  The patient's status was assessed using clinical findings on exam, lab and other diagnostic tests. The patient's disease status was assessed based on evidence-based guidelines and found to be well controlled. MEDICATIONS were reviewed. SELF MANAGEMENT GOALS have been discussed and patient's success at attaining the goal of low cholesterol was assessed. RECOMMENDATION given include regular exercise 3 days a week and low cholesterol/low fat diet. CLINICAL SUMMARY including written plan to identify barriers unique to the patient due to social or economic  reasons was discussed.   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 Patient's depression is uncontrolled with sertraline.   Anhedonia worse.  PHQ 9 was performed score 10. An individual care plan was established or reinforced today.  The patient's disease status was assessed using clinical findings on exam, labs, and or other diagnostic testing to determine patient's success in meeting treatment goals based on disease specific evidence-based guidelines and found to be worsening Recommendations include add aripiprazole qhs    Encounter for screening mammogram for malignant neoplasm of breast -     MM Digital Screening  Chronic constipation -     linaclotide (LINZESS) 290 MCG CAPS capsule; Take 1 capsule (290 mcg total) by mouth daily before breakfast. AN INDIVIDUAL CARE PLAN Chronic constipation was established and reinforced today.  The patient's status was assessed using clinical findings on exam, labs, and other diagnostic testing. Patient's success at meeting treatment goals based on disease specific evidence-bassed guidelines and found to be in fair control. RECOMMENDATIONS include increase linzess dosage present medicines and treatment.   Malnutrition of moderate degree (HCC) -     megestrol (MEGACE) 20 MG tablet; Take 1 tablet (20 mg total) by mouth daily. Patient is osing weight and having anorexia, check lab and try adding megace Need for vaccination -     Flu Vaccine QUAD High Dose(Fluad)   30 minute visit with multiple changes in medicines and depression discussion Meds ordered this encounter  Medications   ARIPiprazole (ABILIFY) 2 MG tablet    Sig: Take 1 tablet (2 mg total) by mouth daily.    Dispense:  30 tablet    Refill:  3   linaclotide (LINZESS) 290 MCG CAPS capsule    Sig: Take 1 capsule (290 mcg total) by mouth daily before breakfast.    Dispense:  90 capsule    Refill:  2   megestrol (MEGACE) 20 MG tablet    Sig: Take 1 tablet (20 mg total) by mouth daily.    Dispense:  30 tablet    Refill:  3    Follow-up: Return in  about 1 month (around 06/21/2021) for depression.    Reinaldo Meeker, MD

## 2021-05-23 LAB — CBC WITH DIFFERENTIAL/PLATELET
Basophils Absolute: 0.1 10*3/uL (ref 0.0–0.2)
Basos: 1 %
EOS (ABSOLUTE): 0.2 10*3/uL (ref 0.0–0.4)
Eos: 2 %
Hematocrit: 34.3 % (ref 34.0–46.6)
Hemoglobin: 11.6 g/dL (ref 11.1–15.9)
Immature Grans (Abs): 0 10*3/uL (ref 0.0–0.1)
Immature Granulocytes: 0 %
Lymphocytes Absolute: 2.9 10*3/uL (ref 0.7–3.1)
Lymphs: 29 %
MCH: 30.9 pg (ref 26.6–33.0)
MCHC: 33.8 g/dL (ref 31.5–35.7)
MCV: 91 fL (ref 79–97)
Monocytes Absolute: 0.9 10*3/uL (ref 0.1–0.9)
Monocytes: 9 %
Neutrophils Absolute: 5.9 10*3/uL (ref 1.4–7.0)
Neutrophils: 59 %
Platelets: 345 10*3/uL (ref 150–450)
RBC: 3.76 x10E6/uL — ABNORMAL LOW (ref 3.77–5.28)
RDW: 11.3 % — ABNORMAL LOW (ref 11.7–15.4)
WBC: 9.9 10*3/uL (ref 3.4–10.8)

## 2021-05-23 LAB — COMPREHENSIVE METABOLIC PANEL
ALT: 5 IU/L (ref 0–32)
AST: 11 IU/L (ref 0–40)
Albumin/Globulin Ratio: 1.6 (ref 1.2–2.2)
Albumin: 4.1 g/dL (ref 3.8–4.8)
Alkaline Phosphatase: 68 IU/L (ref 44–121)
BUN/Creatinine Ratio: 20 (ref 12–28)
BUN: 29 mg/dL — ABNORMAL HIGH (ref 8–27)
Bilirubin Total: 0.2 mg/dL (ref 0.0–1.2)
CO2: 24 mmol/L (ref 20–29)
Calcium: 10.5 mg/dL — ABNORMAL HIGH (ref 8.7–10.3)
Chloride: 103 mmol/L (ref 96–106)
Creatinine, Ser: 1.44 mg/dL — ABNORMAL HIGH (ref 0.57–1.00)
Globulin, Total: 2.6 g/dL (ref 1.5–4.5)
Glucose: 157 mg/dL — ABNORMAL HIGH (ref 70–99)
Potassium: 5 mmol/L (ref 3.5–5.2)
Sodium: 144 mmol/L (ref 134–144)
Total Protein: 6.7 g/dL (ref 6.0–8.5)
eGFR: 40 mL/min/{1.73_m2} — ABNORMAL LOW (ref >59)

## 2021-05-23 LAB — LIPID PANEL
Chol/HDL Ratio: 2.7 ratio (ref 0.0–4.4)
Cholesterol, Total: 171 mg/dL (ref 100–199)
HDL: 63 mg/dL (ref >39)
LDL Chol Calc (NIH): 91 mg/dL (ref 0–99)
Triglycerides: 93 mg/dL (ref 0–149)
VLDL Cholesterol Cal: 17 mg/dL (ref 5–40)

## 2021-05-23 LAB — CARDIOVASCULAR RISK ASSESSMENT

## 2021-05-23 NOTE — Progress Notes (Signed)
Cbc normal, glucose 157, kidney stage 3b, calcium 10.5, get PTH level, liver tests normal, Cholesterol normal,  lp

## 2021-05-24 ENCOUNTER — Other Ambulatory Visit: Payer: Self-pay

## 2021-05-24 ENCOUNTER — Other Ambulatory Visit: Payer: 59

## 2021-05-26 LAB — PARATHYROID HORMONE, INTACT (NO CA): PTH: 52 pg/mL (ref 15–65)

## 2021-05-31 ENCOUNTER — Ambulatory Visit (INDEPENDENT_AMBULATORY_CARE_PROVIDER_SITE_OTHER): Payer: 59

## 2021-05-31 DIAGNOSIS — I420 Dilated cardiomyopathy: Secondary | ICD-10-CM

## 2021-05-31 LAB — CUP PACEART REMOTE DEVICE CHECK
Battery Remaining Longevity: 109 mo
Battery Voltage: 3.02 V
Brady Statistic AP VP Percent: 0.03 %
Brady Statistic AP VS Percent: 23.53 %
Brady Statistic AS VP Percent: 0.03 %
Brady Statistic AS VS Percent: 76.41 %
Brady Statistic RA Percent Paced: 22.99 %
Brady Statistic RV Percent Paced: 0.06 %
Date Time Interrogation Session: 20221006001805
HighPow Impedance: 60 Ohm
Implantable Lead Implant Date: 20120502
Implantable Lead Implant Date: 20120502
Implantable Lead Location: 753859
Implantable Lead Location: 753860
Implantable Lead Model: 4076
Implantable Lead Model: 7122
Implantable Pulse Generator Implant Date: 20200925
Lead Channel Impedance Value: 342 Ohm
Lead Channel Impedance Value: 608 Ohm
Lead Channel Impedance Value: 760 Ohm
Lead Channel Pacing Threshold Amplitude: 0.5 V
Lead Channel Pacing Threshold Amplitude: 1 V
Lead Channel Pacing Threshold Pulse Width: 0.4 ms
Lead Channel Pacing Threshold Pulse Width: 0.4 ms
Lead Channel Sensing Intrinsic Amplitude: 2.25 mV
Lead Channel Sensing Intrinsic Amplitude: 2.25 mV
Lead Channel Sensing Intrinsic Amplitude: 9.25 mV
Lead Channel Sensing Intrinsic Amplitude: 9.25 mV
Lead Channel Setting Pacing Amplitude: 1.5 V
Lead Channel Setting Pacing Amplitude: 2 V
Lead Channel Setting Pacing Pulse Width: 0.4 ms
Lead Channel Setting Sensing Sensitivity: 0.3 mV

## 2021-06-01 ENCOUNTER — Telehealth: Payer: Self-pay

## 2021-06-01 NOTE — Telephone Encounter (Signed)
Patient has an appointment on 06/14/2021 at 4:30 pm arriving at 4:00 pm. I left detailed message on voicemail.

## 2021-06-06 ENCOUNTER — Ambulatory Visit: Payer: 59 | Admitting: Gastroenterology

## 2021-06-08 NOTE — Progress Notes (Signed)
Remote ICD transmission.   

## 2021-06-18 ENCOUNTER — Other Ambulatory Visit: Payer: Self-pay | Admitting: Legal Medicine

## 2021-06-21 ENCOUNTER — Ambulatory Visit: Payer: 59 | Admitting: Legal Medicine

## 2021-06-21 NOTE — Telephone Encounter (Signed)
PATIENT NO SHWED MAMMOGRAM ON 06/14/21

## 2021-06-25 ENCOUNTER — Other Ambulatory Visit: Payer: Self-pay

## 2021-06-25 DIAGNOSIS — K5790 Diverticulosis of intestine, part unspecified, without perforation or abscess without bleeding: Secondary | ICD-10-CM | POA: Insufficient documentation

## 2021-06-25 DIAGNOSIS — J45909 Unspecified asthma, uncomplicated: Secondary | ICD-10-CM | POA: Insufficient documentation

## 2021-06-26 ENCOUNTER — Ambulatory Visit: Payer: 59 | Admitting: Legal Medicine

## 2021-06-26 ENCOUNTER — Encounter: Payer: Self-pay | Admitting: Cardiology

## 2021-06-26 ENCOUNTER — Other Ambulatory Visit: Payer: Self-pay

## 2021-06-26 ENCOUNTER — Ambulatory Visit (INDEPENDENT_AMBULATORY_CARE_PROVIDER_SITE_OTHER): Payer: 59 | Admitting: Cardiology

## 2021-06-26 ENCOUNTER — Ambulatory Visit (INDEPENDENT_AMBULATORY_CARE_PROVIDER_SITE_OTHER): Payer: 59 | Admitting: Legal Medicine

## 2021-06-26 ENCOUNTER — Encounter: Payer: Self-pay | Admitting: Legal Medicine

## 2021-06-26 VITALS — BP 94/64 | HR 85 | Ht 60.0 in | Wt 118.9 lb

## 2021-06-26 VITALS — BP 100/64 | HR 94 | Temp 97.0°F | Resp 15 | Ht 62.0 in | Wt 120.0 lb

## 2021-06-26 DIAGNOSIS — I420 Dilated cardiomyopathy: Secondary | ICD-10-CM

## 2021-06-26 DIAGNOSIS — J9611 Chronic respiratory failure with hypoxia: Secondary | ICD-10-CM | POA: Diagnosis not present

## 2021-06-26 DIAGNOSIS — I2510 Atherosclerotic heart disease of native coronary artery without angina pectoris: Secondary | ICD-10-CM | POA: Diagnosis not present

## 2021-06-26 DIAGNOSIS — N3946 Mixed incontinence: Secondary | ICD-10-CM

## 2021-06-26 DIAGNOSIS — F321 Major depressive disorder, single episode, moderate: Secondary | ICD-10-CM

## 2021-06-26 DIAGNOSIS — Z95810 Presence of automatic (implantable) cardiac defibrillator: Secondary | ICD-10-CM | POA: Diagnosis not present

## 2021-06-26 DIAGNOSIS — J449 Chronic obstructive pulmonary disease, unspecified: Secondary | ICD-10-CM | POA: Diagnosis not present

## 2021-06-26 DIAGNOSIS — E440 Moderate protein-calorie malnutrition: Secondary | ICD-10-CM

## 2021-06-26 DIAGNOSIS — F5101 Primary insomnia: Secondary | ICD-10-CM

## 2021-06-26 MED ORDER — DIAZEPAM 5 MG PO TABS: 5.0000 mg | ORAL_TABLET | Freq: Three times a day (TID) | ORAL | 3 refills | Status: DC | PRN

## 2021-06-26 MED ORDER — FLUTICASONE FUROATE-VILANTEROL 100-25 MCG/ACT IN AEPB: 1.0000 | INHALATION_SPRAY | Freq: Every day | RESPIRATORY_TRACT | 11 refills | Status: DC

## 2021-06-26 NOTE — Addendum Note (Signed)
Addended by: Senaida Ores on: 06/26/2021 11:02 AM   Modules accepted: Orders

## 2021-06-26 NOTE — Progress Notes (Signed)
Subjective:  Patient ID: [name], female    DOB: 13-May-1956  Age: 65 y.o. MRN: [id number]  Chief Complaint  Patient presents with   Depression    HPI: patient is doing better with her depresion and is sleeping. She just saw cardiology and she needs papers filled for her incontinence   Current Outpatient Medications on File Prior to Visit  Medication Sig Dispense Refill   ARIPiprazole (ABILIFY) 2 MG tablet Take 1 tablet (2 mg total) by mouth daily. 30 tablet 3   aspirin 81 MG tablet Take 81 mg by mouth daily.     Buprenorphine HCl (BELBUCA) 900 MCG FILM Place 900 mcg inside cheek every 12 (twelve) hours.     Buprenorphine HCl-Naloxone HCl 8-2 MG FILM Place 1 strip under the tongue 2 (two) times daily.     carvedilol (COREG) 3.125 MG tablet Take 1 tablet (3.125 mg total) by mouth 2 (two) times daily with a meal. 180 tablet 2   celecoxib (CELEBREX) 200 MG capsule TAKE ONE CAPSULE BY MOUTH EVERY MORNING (Patient taking differently: Take 200 mg by mouth daily.) 90 capsule 2   cyclobenzaprine (FLEXERIL) 10 MG tablet Take 10 mg by mouth 3 (three) times daily as needed for muscle spasms.     denosumab (PROLIA) 60 MG/ML SOSY injection Inject 60 mg into the skin every 6 (six) months. 180 mL 2   diclofenac Sodium (VOLTAREN) 1 % GEL Apply 2 g topically 4 (four) times daily. 50 g 4   diphenoxylate-atropine (LOMOTIL) 2.5-0.025 MG tablet Take 1 tablet by mouth 4 (four) times daily as needed for diarrhea or loose stools. 30 tablet 0   ENTRESTO 24-26 MG TAKE ONE TABLET BY MOUTH TWICE DAILY (Patient taking differently: Take 1 tablet by mouth 2 (two) times daily.) 180 tablet 2   EPINEPHRINE 0.3 mg/0.3 mL IJ SOAJ injection Inject 0.3 mLs (0.3 mg total) into the muscle as needed for anaphylaxis. 1 each 2   Erenumab-aooe (AIMOVIG) 140 MG/ML SOAJ Inject 140 mg into the skin every 28 (twenty-eight) days. 1.12 mL 5   esomeprazole (NEXIUM) 20 MG capsule TAKE ONE CAPSULE BY MOUTH ONCE DAILY (Patient taking  differently: Take 20 mg by mouth daily.) 90 capsule 2   fluticasone (FLONASE) 50 MCG/ACT nasal spray Place 2 sprays into both nostrils daily as needed (congestion). (Patient taking differently: Place 2 sprays into both nostrils daily as needed for allergies (congestion).) 18 mL 2   furosemide (LASIX) 40 MG tablet TAKE ONE TABLET BY MOUTH EVERY MORNING MAY take 0.5 tablet in THE afternoon IF needed] (Patient taking differently: Take 20 mg by mouth daily as needed for edema or fluid.) 90 tablet 2   gabapentin (NEURONTIN) 800 MG tablet Take 1 tablet (800 mg total) by mouth 3 (three) times daily. Patient usually takes twice a day (Patient taking differently: Take 800 mg by mouth 2 (two) times daily.) 270 tablet 2   ipratropium (ATROVENT) 0.02 % nebulizer solution Take 2.5 mLs (0.5 mg total) by nebulization every 4 (four) hours as needed for wheezing or shortness of breath. 150 mL 2   ipratropium-albuterol (DUONEB) 0.5-2.5 (3) MG/3ML SOLN Inhale 3 mLs into the lungs every 6 (six) hours as needed (sob). 270 mL 3   levocetirizine (XYZAL) 5 MG tablet Take 5 mg by mouth daily as needed for allergies.     linaclotide (LINZESS) 290 MCG CAPS capsule Take 1 capsule (290 mcg total) by mouth daily before breakfast. 90 capsule 2   LINZESS 145 MCG  CAPS capsule Take 1 capsule (145 mcg total) by mouth daily as needed (indigestion). 90 capsule 2   LORazepam (ATIVAN) 2 MG tablet Take 2 mg by mouth at bedtime as needed for anxiety.     megestrol (MEGACE) 20 MG tablet Take 1 tablet (20 mg total) by mouth daily. 30 tablet 3   Multiple Vitamin (MULTIVITAMIN WITH MINERALS) TABS tablet Take 1 tablet by mouth daily. (Patient taking differently: Take 1 tablet by mouth daily. Unknown strength)     naloxone (NARCAN) nasal spray 4 mg/0.1 mL Place 1 spray into the nose once.     nitroGLYCERIN (NITROSTAT) 0.4 MG SL tablet Place 1 tablet (0.4 mg total) under the tongue every 5 (five) minutes as needed for chest pain. 25 tablet 6    ondansetron (ZOFRAN) 4 MG tablet Take 1 tablet (4 mg total) by mouth every 8 (eight) hours as needed for nausea or vomiting. 20 tablet 0   ranolazine (RANEXA) 500 MG 12 hr tablet TAKE 1 TABLET(500 MG) BY MOUTH TWICE DAILY (Patient taking differently: Take 500 mg by mouth 2 (two) times daily. TAKE 1 TABLET(500 MG) BY MOUTH TWICE DAILY) 180 tablet 2   rosuvastatin (CRESTOR) 20 MG tablet TAKE ONE TABLET BY MOUTH EVERY MORNING (Patient taking differently: Take 20 mg by mouth daily.) 90 tablet 2   sertraline (ZOLOFT) 50 MG tablet TAKE 1 TABLET BY MOUTH DAILY (Patient taking differently: Take 50 mg by mouth daily.) 90 tablet 2   tiZANidine (ZANAFLEX) 4 MG tablet Take 4 mg by mouth at bedtime.     topiramate (TOPAMAX) 50 MG tablet Take 2 tablets (100 mg total) by mouth at bedtime. 240 tablet 2   UBRELVY 100 MG TABS TAKE 1 TABLET BY MOUTH BY MOUTH AS NEEDED( MAY REPEAT 1 TABLET AFTER 2 HOURS IF NEEDED, MAXIMUM 2 TABLETS IN 24 HOURS) (Patient taking differently: Take 1 tablet by mouth as directed. Take 1 tab po as needed, may repeat 1 tab after 2 hour as needed max 2 tabs in 24 hours) 16 tablet 3   No current facility-administered medications on file prior to visit.   Past Medical History:  Diagnosis Date   Abdominal pain 10/17/2020   Abnormality of gait due to impairment of balance 11/22/2020   Acute diverticulitis 10/17/2020   Admission for long-term opiate analgesic use 10/24/2019   Arthritis of right hip 05/29/2016   Formatting of this note might be different from the original. Added automatically from request for surgery [id number]   BMI 26.0-26.9,adult 03/29/2020   Bronchial asthma    Cardiomyopathy ([place])    Overview:  Ejection fraction 45% in 2015 Ejection fraction 30 to 35% in November 2018   Chronic back pain    Chronic hypoxemic respiratory failure ([place]) 10/24/2019   Chronic narcotic use 03/23/2015   Chronic pain of both knees 12/21/2018   Added automatically from request for surgery [id number]   Formatting of this note might be different from the original. 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   Dyslipidemia 05/31/2015   Essential hypertension 05/31/2015   GERD (gastroesophageal reflux disease)    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[place]) 07/13/2020   Hypotension 05/17/2020   ICD (implantable cardiac defibrillator) in place 2012   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   Ileus following gastrointestinal surgery ([place]) 03/26/2015   Major depressive disorder, single episode, moderate ([place]) 10/24/2019   Malnutrition of moderate degree ([place]) 10/20/2020   Mesenteric ischemia ([place]) 10/16/2020   Migraine without aura with status migrainosus 10/24/2019   Mixed incontinence 10/20/2020   Myocardial infarction (HCC)    Nausea & vomiting    Nausea alone 04/19/2014   Obstructive chronic bronchitis with exacerbation ([place]) 10/25/2019   Other spondylosis with radiculopathy, lumbar region 10/24/2019   Persistent vomiting 05/17/2020   Presence of left artificial hip joint 10/24/2019   PTSD (post-traumatic stress disorder)    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 03/23/2015 04/19/2014   Senile osteoporosis 10/24/2019   Sepsis ([place]) 10/17/2020   Small bowel obstruction ([place])    Thyroid disease    Past Surgical History:   Procedure Laterality Date   APPENDECTOMY     CARDIAC CATHETERIZATION     CARPAL TUNNEL RELEASE     CESAREAN SECTION     CHF s/p AICD   12/2010   CHOLECYSTECTOMY     COLONOSCOPY  10/16/2015   Mild colonic diverticulosis, predominantly in the left colon. Small internal hemrrhoids. Otherwise normal colonoscopy   CORONARY ANGIOPLASTY     ESOPHAGOGASTRODUODENOSCOPY  02/04/2014   Mild gastritis. Otherwise normal EGD   HAND SURGERY     ICD GENERATOR CHANGEOUT N/A 05/21/2019   Procedure: ICD GENERATOR CHANGEOUT;  Surgeon: Constance Haw, MD;  Location: [facility];  Service: Cardiovascular;  Laterality: N/A;   ICD IMPLANT     Medtronic   intestinal blockage 2011     LAPAROSCOPIC ASSISTED VENTRAL HERNIA REPAIR N/A 03/23/2015   Procedure: LAPAROSCOPIC VENTRAL WALL HERNIA REPAIR;  Surgeon: Michael Boston, MD;  Location: [facility];  Service: General;  Laterality: N/A;  With MESH   LAPAROSCOPIC LYSIS OF ADHESIONS N/A 03/23/2015   Procedure: LAPAROSCOPIC LYSIS OF ADHESIONS;  Surgeon: Michael Boston, MD;  Location: [facility];  Service: General;  Laterality: N/A;   LSCS      x2   NASAL SEPTUM SURGERY     NECK SURGERY     fused   TONSILLECTOMY     ULNAR NERVE TRANSPOSITION  01/23/2012   Procedure: ULNAR NERVE DECOMPRESSION/TRANSPOSITION;  Surgeon: Ophelia Charter, MD;  Location: [facility];  Service: Neurosurgery;  Laterality: Left;  LEFT ulnar nerve decompression    Family History  Problem Relation Age of Onset   Heart attack Other    Cancer Other    Heart failure Other    Hypertension Mother    Heart attack Mother    Alcohol abuse Father    Hypertension Father    Heart attack Father    Anesthesia problems Neg Hx    Hypotension Neg Hx    Malignant hyperthermia Neg Hx    Pseudochol deficiency Neg Hx    Social History   Socioeconomic History   Marital status: Single    Spouse name: Not on file   Number of children: 3   Years of education: Not on file   Highest education  level: Not on file  Occupational History   Occupation: disabled  Tobacco Use  Smoking status: Former    Packs/day: 0.50    Years: 30.00    Pack years: 15.00    Types: Cigarettes    Quit date: 03/2020    Years since quitting: 1.2   Smokeless tobacco: Never  Vaping Use   Vaping Use: Never used  Substance and Sexual Activity   Alcohol use: Yes    Alcohol/week: 2.0 standard drinks    Types: 1 Cans of beer, 1 Standard drinks or equivalent per week    Comment: seldom   Drug use: No   Sexual activity: Not Currently  Other Topics Concern   Not on file  Social History Narrative   One level home with boyfriend   Caffeine - coffee 1-2 cups/day; Green tea 4-5 bottles a day   Exercise - some    Right handed      Social Determinants of Health   Financial Resource Strain: Low Risk    Difficulty of Paying Living Expenses: Not hard at all  Food Insecurity: No Food Insecurity   Worried About Charity fundraiser in the Last Year: Never true   Ran Out of Food in the Last Year: Never true  Transportation Needs: Unmet Transportation Needs   Lack of Transportation (Medical): Not on file   Lack of Transportation (Non-Medical): Yes  Physical Activity: Inactive   Days of Exercise per Week: 0 days   Minutes of Exercise per Session: 0 min  Stress: Stress Concern Present   Feeling of Stress : Rather much  Social Connections: Moderately Integrated   Frequency of Communication with Friends and Family: More than three times a week   Frequency of Social Gatherings with Friends and Family: Never   Attends Religious Services: 1 to 4 times per year   Active Member of [employer] or Organizations: No   Attends Archivist Meetings: Never   Marital Status: Married    Review of Systems  Constitutional:  Negative for chills, fatigue and fever.  HENT:  Negative for congestion, ear pain and sore throat.   Respiratory:  Positive for shortness of breath. Negative for cough.   Cardiovascular:   Negative for chest pain and palpitations.  Gastrointestinal:  Negative for abdominal pain, constipation, diarrhea, nausea and vomiting.  Endocrine: Negative for polydipsia, polyphagia and polyuria.  Genitourinary:  Negative for difficulty urinating and dysuria.  Musculoskeletal:  Negative for arthralgias, back pain and myalgias.  Skin:  Negative for rash.  Neurological:  Negative for headaches.  Psychiatric/Behavioral:  Positive for sleep disturbance. Negative for dysphoric mood and suicidal ideas. The patient is not nervous/anxious.     Objective:  BP 100/64   Pulse 94   Temp (!) 97 F (36.1 C)   Resp 15   Ht 5\' 2"  (1.575 m)   Wt 120 lb (54.4 kg)   SpO2 96%   BMI 21.95 kg/m   BP/Weight 06/26/2021 06/26/2021 01/21/4131  Systolic BP 440 94 102  Diastolic BP 64 64 78  Wt. (Lbs) 120 118.9 115.8  BMI 21.95 23.22 22.62    Physical Exam Vitals reviewed.  Constitutional:      General: She is not in acute distress.    Appearance: Normal appearance.  HENT:     Head: Normocephalic.     Right Ear: Tympanic membrane, ear canal and external ear normal.     Left Ear: Tympanic membrane, ear canal and external ear normal.     Nose: Nose normal.     Mouth/Throat:     Mouth: Mucous membranes  are moist.     Pharynx: Oropharynx is clear.  Eyes:     Extraocular Movements: Extraocular movements intact.     Conjunctiva/sclera: Conjunctivae normal.  Cardiovascular:     Rate and Rhythm: Normal rate and regular rhythm.     Pulses: Normal pulses.     Heart sounds: Normal heart sounds. No murmur heard.   No gallop.  Pulmonary:     Effort: Pulmonary effort is normal. No respiratory distress.     Breath sounds: No wheezing.  Abdominal:     General: Abdomen is flat. Bowel sounds are normal. There is no distension.     Palpations: Abdomen is soft.     Tenderness: There is no abdominal tenderness.  Musculoskeletal:        General: Normal range of motion.     Cervical back: Normal range of  motion and neck supple.  Skin:    General: Skin is warm and dry.     Capillary Refill: Capillary refill takes less than 2 seconds.  Neurological:     General: No focal deficit present.     Mental Status: She is alert. Mental status is at baseline.  Psychiatric:        Mood and Affect: Mood normal.    Depression screen [facility] 2/9 06/26/2021 05/22/2021 03/27/2021 02/15/2021 08/11/2020  Decreased Interest 2 1 2 2 2   Down, Depressed, Hopeless 1 0 2 3 2   PHQ - 2 Score 3 1 4 5 4   Altered sleeping 3 3 3 3 3   Tired, decreased energy 0 3 3 2 3   Change in appetite 3 3 3  - 3  Feeling bad or failure about yourself  1 0 0 2 3  Trouble concentrating 0 0 3 3 2   Moving slowly or fidgety/restless 0 0 2 2 3   Suicidal thoughts 1 0 0 0 2  PHQ-9 Score 11 10 18 17 23   Difficult doing work/chores Somewhat difficult Not difficult at all Very difficult Very difficult Somewhat difficult  Some recent data might be hidden       Lab Results  Component Value Date   WBC 9.9 05/22/2021   HGB 11.6 05/22/2021   HCT 34.3 05/22/2021   PLT 345 05/22/2021   GLUCOSE 157 (H) 05/22/2021   CHOL 171 05/22/2021   TRIG 93 05/22/2021   HDL 63 05/22/2021   LDLCALC 91 05/22/2021   ALT 5 05/22/2021   AST 11 05/22/2021   NA 144 05/22/2021   K 5.0 05/22/2021   CL 103 05/22/2021   CREATININE 1.44 (H) 05/22/2021   BUN 29 (H) 05/22/2021   CO2 24 05/22/2021   TSH 0.816 02/15/2021   INR 1.0 10/18/2020      Assessment & Plan:   Problem List Items Addressed This Visit       Respiratory   Chronic hypoxemic respiratory failure (HCC)   Relevant Medications   fluticasone furoate-vilanterol (BREO ELLIPTA) 100-25 MCG/ACT AEPB Patient has chronic hypoxemia an needs to use O2 regularly, cardiology is working up her dyspneic spells     Other   Major depressive disorder, single episode, moderate (HCC) - Primary   Relevant Medications   diazepam (VALIUM) 5 MG tablet Patient's depression is controlled with sertraline.    Anhedonia better.  PHQ 9 was performed score 11. An individual care plan was established or reinforced today.  The patient's disease status was assessed using clinical findings on exam, labs, and or other diagnostic testing to determine patient's success in meeting treatment goals based  on disease specific evidence-based guidelines and found to be improving Recommendations include stay on medicines     Mixed incontinence Patient has mixed incontinence and requires pads, paperwork completed, patient needs DME to maintain present level of function and to prevent deterioration of her present situation    Malnutrition of moderate degree ([place]) Supplement nutrition with protein/calorie supplement with meals to improve nutritional status.    Other Visit Diagnoses     Primary insomnia       Relevant Medications   diazepam (VALIUM) 5 MG tablet Patient has insomnia and requires valium to sleep , we discussed habit forming properties     .  Meds ordered this encounter  Medications   fluticasone furoate-vilanterol (BREO ELLIPTA) 100-25 MCG/ACT AEPB    Sig: Inhale 1 puff into the lungs daily.    Dispense:  1 each    Refill:  11   diazepam (VALIUM) 5 MG tablet    Sig: Take 1 tablet (5 mg total) by mouth 3 (three) times daily as needed for anxiety.    Dispense:  90 tablet    Refill:  3         Follow-up: Return in about 3 months (around 09/26/2021) for fasting.  An After Visit Summary was printed and given to the patient.  Reinaldo Meeker, MD [facility] [phone]

## 2021-06-26 NOTE — Patient Instructions (Signed)
Medication Instructions:  Your physician has recommended you make the following change in your medication:  STOP: Fludrocortisone   STOP Afternoon lasix dose.    *If you need a refill on your cardiac medications before your next appointment, please call your pharmacy*   Lab Work: Your physician recommends that you return for lab work today: bmp, pro bnp   If you have labs (blood work) drawn today and your tests are completely normal, you will receive your results only by: MyChart Message (if you have MyChart) OR A paper copy in the mail If you have any lab test that is abnormal or we need to change your treatment, we will call you to review the results.   Testing/Procedures: None   Follow-Up: At [facility], you and your health needs are our priority.  As part of our continuing mission to provide you with exceptional heart care, we have created designated Provider Care Teams.  These Care Teams include your primary Cardiologist (physician) and Advanced Practice Providers (APPs -  Physician Assistants and Nurse Practitioners) who all work together to provide you with the care you need, when you need it.  We recommend signing up for the patient portal called "MyChart".  Sign up information is provided on this After Visit Summary.  MyChart is used to connect with patients for Virtual Visits (Telemedicine).  Patients are able to view lab/test results, encounter notes, upcoming appointments, etc.  Non-urgent messages can be sent to your provider as well.   To learn more about what you can do with MyChart, go to [email or web address].    Your next appointment:   6 week(s)  The format for your next appointment:   In Person  Provider:   Jenne Campus, MD   Other Instructions

## 2021-06-26 NOTE — Progress Notes (Signed)
Cardiology Office Note:    Date:  06/26/2021   ID:  [name], DOB 1955-12-23, MRN [id number]  PCP:  Lillard Anes, MD  Cardiologist:  Jenne Campus, MD    Referring MD: Lillard Anes,*   Chief Complaint  Patient presents with   Shortness of Breath         History of Present Illness:     [name] is a 65 y.o. female  with past medical history significant for cardiomyopathy ejection fraction 35% last estimation in April 2021 showing ejection fraction 35 to 40%, BiV ICD, essential hypertension, fibromyalgia, . She comes today 2 months of follow-up.  Overall she is not doing well she complain of being weak tired exhausted and having shortness of breath specially when she wakes up in the morning.  Recently she wore a monitor which shows some symptomatic PVCs however because of low blood pressure there is not much room to give her more medications for congestive heart proteinuria and cardiomyopathy.  She denies have any chest pain tightness squeezing pressure burning chest there is no swelling of lower extremities.  Her primary care physician gave her fludrocortisone to help with hot hypotension I do not think it is a good move.  I will discontinue this medication also will cut down some diuretic at evening time.  We will check proBNP as well as Chem-7 today and I will make adjustment of her medication based on results of those test.   Past Medical History:  Diagnosis Date   Abdominal pain 10/17/2020   Abnormality of gait due to impairment of balance 11/22/2020   Acute diverticulitis 10/17/2020   Admission for long-term opiate analgesic use 10/24/2019   Arthritis of right hip 05/29/2016   Formatting of this note might be different from the original. Added automatically from request for surgery [id number]   BMI 26.0-26.9,adult 03/29/2020   Bronchial asthma    Cardiomyopathy ([place])    Overview:  Ejection fraction 45% in 2015 Ejection fraction 30 to 35% in November  2018   Chronic back pain    Chronic hypoxemic respiratory failure ([place]) 10/24/2019   Chronic narcotic use 03/23/2015   Chronic pain of both knees 12/21/2018   Added automatically from request for surgery [id number]  Formatting of this note might be different from the original. 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   Dyslipidemia 05/31/2015   Essential hypertension 05/31/2015   GERD (gastroesophageal reflux disease)    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[place]) 07/13/2020   Hypotension 05/17/2020   ICD (implantable cardiac defibrillator) in place 2012   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   Ileus following gastrointestinal surgery ([place]) 03/26/2015   Major depressive disorder, single episode, moderate ([place]) 10/24/2019   Malnutrition of moderate degree ([place]) 10/20/2020   Mesenteric ischemia ([place]) 10/16/2020   Migraine without aura with status migrainosus 10/24/2019   Mixed incontinence 10/20/2020   Myocardial infarction ([place])  Nausea & vomiting    Nausea alone 04/19/2014   Obstructive chronic bronchitis with exacerbation ([place]) 10/25/2019   Other spondylosis with radiculopathy, lumbar region 10/24/2019   Persistent vomiting 05/17/2020   Presence of left artificial hip joint 10/24/2019   PTSD (post-traumatic stress disorder)    Recurrent incisional  hernias with incarceration s/p lap repair w mesh 03/23/2015 04/19/2014   Senile osteoporosis 10/24/2019   Sepsis ([place]) 10/17/2020   Small bowel obstruction ([place])    Thyroid disease     Past Surgical History:  Procedure Laterality Date   APPENDECTOMY     CARDIAC CATHETERIZATION     CARPAL TUNNEL RELEASE     CESAREAN SECTION     CHF s/p AICD   12/2010   CHOLECYSTECTOMY     COLONOSCOPY  10/16/2015   Mild colonic diverticulosis, predominantly in the left colon. Small internal hemrrhoids. Otherwise normal colonoscopy   CORONARY ANGIOPLASTY     ESOPHAGOGASTRODUODENOSCOPY  02/04/2014   Mild gastritis. Otherwise normal EGD   HAND SURGERY     ICD GENERATOR CHANGEOUT N/A 05/21/2019   Procedure: ICD GENERATOR CHANGEOUT;  Surgeon: Constance Haw, MD;  Location: [facility];  Service: Cardiovascular;  Laterality: N/A;   ICD IMPLANT     Medtronic   intestinal blockage 2011     LAPAROSCOPIC ASSISTED VENTRAL HERNIA REPAIR N/A 03/23/2015   Procedure: LAPAROSCOPIC VENTRAL WALL HERNIA REPAIR;  Surgeon: Michael Boston, MD;  Location: [facility];  Service: General;  Laterality: N/A;  With MESH   LAPAROSCOPIC LYSIS OF ADHESIONS N/A 03/23/2015   Procedure: LAPAROSCOPIC LYSIS OF ADHESIONS;  Surgeon: Michael Boston, MD;  Location: [facility];  Service: General;  Laterality: N/A;   LSCS      x2   NASAL SEPTUM SURGERY     NECK SURGERY     fused   TONSILLECTOMY     ULNAR NERVE TRANSPOSITION  01/23/2012   Procedure: ULNAR NERVE DECOMPRESSION/TRANSPOSITION;  Surgeon: Ophelia Charter, MD;  Location: [facility];  Service: Neurosurgery;  Laterality: Left;  LEFT ulnar nerve decompression    Current Medications: Current Meds  Medication Sig   ARIPiprazole (ABILIFY) 2 MG tablet Take 1 tablet (2 mg total) by mouth daily.   aspirin 81 MG tablet Take 81 mg by mouth daily.   BREO ELLIPTA 100-25 MCG/INH AEPB INHALE 1 PUFF BY MOUTH INTO LUNGS DAILY in THE afternoon (Patient taking differently: Inhale 1 puff  into the lungs daily.)   Buprenorphine HCl (BELBUCA) 900 MCG FILM Place 900 mcg inside cheek every 12 (twelve) hours.   Buprenorphine HCl-Naloxone HCl 8-2 MG FILM Place 1 strip under the tongue 2 (two) times daily.   carvedilol (COREG) 3.125 MG tablet Take 1 tablet (3.125 mg total) by mouth 2 (two) times daily with a meal.   celecoxib (CELEBREX) 200 MG capsule TAKE ONE CAPSULE BY MOUTH EVERY MORNING (Patient taking differently: Take 200 mg by mouth daily.)   cyclobenzaprine (FLEXERIL) 10 MG tablet Take 10 mg by mouth 3 (three) times daily as needed for muscle spasms.   denosumab (PROLIA) 60 MG/ML SOSY injection Inject 60 mg into the skin every 6 (six) months.   diazepam (VALIUM) 5 MG tablet Take 1 tablet (5 mg total) by mouth 3 (three) times daily as needed for anxiety.   diclofenac Sodium (VOLTAREN) 1 % GEL Apply 2 g topically 4 (four) times daily.   diphenoxylate-atropine (LOMOTIL) 2.5-0.025 MG tablet Take 1 tablet by mouth 4 (four) times daily as needed for diarrhea or  loose stools.   ENTRESTO 24-26 MG TAKE ONE TABLET BY MOUTH TWICE DAILY (Patient taking differently: Take 1 tablet by mouth 2 (two) times daily.)   EPINEPHRINE 0.3 mg/0.3 mL IJ SOAJ injection Inject 0.3 mLs (0.3 mg total) into the muscle as needed for anaphylaxis.   Erenumab-aooe (AIMOVIG) 140 MG/ML SOAJ Inject 140 mg into the skin every 28 (twenty-eight) days.   esomeprazole (NEXIUM) 20 MG capsule TAKE ONE CAPSULE BY MOUTH ONCE DAILY (Patient taking differently: Take 20 mg by mouth daily.)   fludrocortisone (FLORINEF) 0.1 MG tablet TAKE 1 TABLET BY MOUTH AS DIRECTED. (Patient taking differently: Take 0.1 mg by mouth daily.)   fluticasone (FLONASE) 50 MCG/ACT nasal spray Place 2 sprays into both nostrils daily as needed (congestion). (Patient taking differently: Place 2 sprays into both nostrils daily as needed for allergies (congestion).)   furosemide (LASIX) 40 MG tablet TAKE ONE TABLET BY MOUTH EVERY MORNING MAY take 0.5 tablet in  THE afternoon IF needed] (Patient taking differently: Take 20 mg by mouth daily as needed for edema or fluid.)   gabapentin (NEURONTIN) 800 MG tablet Take 1 tablet (800 mg total) by mouth 3 (three) times daily. Patient usually takes twice a day (Patient taking differently: Take 800 mg by mouth 2 (two) times daily.)   ipratropium (ATROVENT) 0.02 % nebulizer solution Take 2.5 mLs (0.5 mg total) by nebulization every 4 (four) hours as needed for wheezing or shortness of breath.   ipratropium-albuterol (DUONEB) 0.5-2.5 (3) MG/3ML SOLN Inhale 3 mLs into the lungs every 6 (six) hours as needed (sob).   levocetirizine (XYZAL) 5 MG tablet Take 5 mg by mouth daily as needed for allergies.   linaclotide (LINZESS) 290 MCG CAPS capsule Take 1 capsule (290 mcg total) by mouth daily before breakfast.   LINZESS 145 MCG CAPS capsule Take 1 capsule (145 mcg total) by mouth daily as needed (indigestion).   LORazepam (ATIVAN) 2 MG tablet Take 2 mg by mouth at bedtime as needed for anxiety.   megestrol (MEGACE) 20 MG tablet Take 1 tablet (20 mg total) by mouth daily.   Multiple Vitamin (MULTIVITAMIN WITH MINERALS) TABS tablet Take 1 tablet by mouth daily. (Patient taking differently: Take 1 tablet by mouth daily. Unknown strength)   naloxone (NARCAN) nasal spray 4 mg/0.1 mL Place 1 spray into the nose once.   nitroGLYCERIN (NITROSTAT) 0.4 MG SL tablet Place 1 tablet (0.4 mg total) under the tongue every 5 (five) minutes as needed for chest pain.   ondansetron (ZOFRAN) 4 MG tablet Take 1 tablet (4 mg total) by mouth every 8 (eight) hours as needed for nausea or vomiting.   Oxycodone HCl 10 MG TABS Take 1 tablet by mouth every 4 (four) hours as needed (pain).   ranolazine (RANEXA) 500 MG 12 hr tablet TAKE 1 TABLET(500 MG) BY MOUTH TWICE DAILY (Patient taking differently: Take 500 mg by mouth 2 (two) times daily. TAKE 1 TABLET(500 MG) BY MOUTH TWICE DAILY)   rosuvastatin (CRESTOR) 20 MG tablet TAKE ONE TABLET BY MOUTH EVERY  MORNING (Patient taking differently: Take 20 mg by mouth daily.)   sertraline (ZOLOFT) 100 MG tablet Take 0.5 tablets (50 mg total) by mouth daily.   sertraline (ZOLOFT) 50 MG tablet TAKE 1 TABLET BY MOUTH DAILY (Patient taking differently: Take 50 mg by mouth daily.)   tiZANidine (ZANAFLEX) 4 MG tablet Take 4 mg by mouth at bedtime.   topiramate (TOPAMAX) 50 MG tablet Take 2 tablets (100 mg total) by mouth at bedtime.  UBRELVY 100 MG TABS TAKE 1 TABLET BY MOUTH BY MOUTH AS NEEDED( MAY REPEAT 1 TABLET AFTER 2 HOURS IF NEEDED, MAXIMUM 2 TABLETS IN 24 HOURS) (Patient taking differently: Take 1 tablet by mouth as directed. Take 1 tab po as needed, may repeat 1 tab after 2 hour as needed max 2 tabs in 24 hours)     Allergies:   Patient has no known allergies.   Social History   Socioeconomic History   Marital status: Single    Spouse name: Not on file   Number of children: 3   Years of education: Not on file   Highest education level: Not on file  Occupational History   Occupation: disabled  Tobacco Use   Smoking status: Former    Packs/day: 0.50    Years: 30.00    Pack years: 15.00    Types: Cigarettes    Quit date: 03/2020    Years since quitting: 1.2   Smokeless tobacco: Never  Vaping Use   Vaping Use: Never used  Substance and Sexual Activity   Alcohol use: Yes    Alcohol/week: 2.0 standard drinks    Types: 1 Cans of beer, 1 Standard drinks or equivalent per week    Comment: seldom   Drug use: No   Sexual activity: Not Currently  Other Topics Concern   Not on file  Social History Narrative   One level home with boyfriend   Caffeine - coffee 1-2 cups/day; Green tea 4-5 bottles a day   Exercise - some    Right handed      Social Determinants of Health   Financial Resource Strain: Low Risk    Difficulty of Paying Living Expenses: Not hard at all  Food Insecurity: No Food Insecurity   Worried About Charity fundraiser in the Last Year: Never true   Ran Out of Food in  the Last Year: Never true  Transportation Needs: Unmet Transportation Needs   Lack of Transportation (Medical): Not on file   Lack of Transportation (Non-Medical): Yes  Physical Activity: Inactive   Days of Exercise per Week: 0 days   Minutes of Exercise per Session: 0 min  Stress: Stress Concern Present   Feeling of Stress : Rather much  Social Connections: Moderately Integrated   Frequency of Communication with Friends and Family: More than three times a week   Frequency of Social Gatherings with Friends and Family: Never   Attends Religious Services: 1 to 4 times per year   Active Member of [employer] or Organizations: No   Attends Music therapist: Never   Marital Status: Married     Family History: The patient's family history includes Alcohol abuse in her father; Cancer in an other family member; Heart attack in her father, mother, and another family member; Heart failure in an other family member; Hypertension in her father and mother. There is no history of Anesthesia problems, Hypotension, Malignant hyperthermia, or Pseudochol deficiency. ROS:   Please see the history of present illness.    All 14 point review of systems negative except as described per history of present illness  EKGs/Labs/Other Studies Reviewed:      Recent Labs: 09/05/2020: NT-Pro BNP 546 10/18/2020: B Natriuretic Peptide 193.4 11/09/2020: Magnesium 2.0 02/15/2021: TSH 0.816 05/22/2021: ALT 5; BUN 29; Creatinine, Ser 1.44; Hemoglobin 11.6; Platelets 345; Potassium 5.0; Sodium 144  Recent Lipid Panel    Component Value Date/Time   CHOL 171 05/22/2021 1106   TRIG 93 05/22/2021 1106  HDL 63 05/22/2021 1106   CHOLHDL 2.7 05/22/2021 1106   LDLCALC 91 05/22/2021 1106    Physical Exam:    VS:  BP 94/64 (BP Location: Right Arm, Patient Position: Sitting)   Pulse 85   Ht 5' (1.524 m)   Wt 118 lb 14.4 oz (53.9 kg)   SpO2 100%   BMI 23.22 kg/m     Wt Readings from Last 3 Encounters:   06/26/21 118 lb 14.4 oz (53.9 kg)  05/22/21 115 lb 12.8 oz (52.5 kg)  03/27/21 122 lb (55.3 kg)     GEN:  Well nourished, well developed in no acute distress HEENT: Normal NECK: No JVD; No carotid bruits LYMPHATICS: No lymphadenopathy CARDIAC: RRR, no murmurs, no rubs, no gallops RESPIRATORY:  Clear to auscultation without rales, wheezing or rhonchi  ABDOMEN: Soft, non-tender, non-distended MUSCULOSKELETAL:  No edema; No deformity  SKIN: Warm and dry LOWER EXTREMITIES: no swelling NEUROLOGIC:  Alert and oriented x 3 PSYCHIATRIC:  Normal affect   ASSESSMENT:    1. Dilated cardiomyopathy ([place])   2. Coronary artery disease involving native coronary artery of native heart without angina pectoris   3. Chronic obstructive pulmonary disease, unspecified COPD type ([place])   4. ICD (implantable cardioverter-defibrillator) in place    PLAN:    In order of problems listed above:  Dilated cardiomyopathy difficulty putting her correct medication because of low blood pressure.  Plan as described above. Coronary disease: That stable denies have any chest pain tightness squeezing pressure burning chest. COPD: Followed by antimedicine team. ICD present, it is a Medtronic device last interrogation on sixth of this month which I did review about 9 years left in the device, normal function   Medication Adjustments/Labs and Tests Ordered: Current medicines are reviewed at length with the patient today.  Concerns regarding medicines are outlined above.  No orders of the defined types were placed in this encounter.  Medication changes: No orders of the defined types were placed in this encounter.   Signed, Park Liter, MD, [facility] ([place] 06/26/2021 10:47 AM    [place]

## 2021-06-27 LAB — BASIC METABOLIC PANEL
BUN/Creatinine Ratio: 17 (ref 12–28)
BUN: 21 mg/dL (ref 8–27)
CO2: 21 mmol/L (ref 20–29)
Calcium: 10 mg/dL (ref 8.7–10.3)
Chloride: 103 mmol/L (ref 96–106)
Creatinine, Ser: 1.23 mg/dL — ABNORMAL HIGH (ref 0.57–1.00)
Glucose: 93 mg/dL (ref 70–99)
Potassium: 4.3 mmol/L (ref 3.5–5.2)
Sodium: 138 mmol/L (ref 134–144)
eGFR: 48 mL/min/{1.73_m2} — ABNORMAL LOW (ref >59)

## 2021-06-27 LAB — PRO B NATRIURETIC PEPTIDE: NT-Pro BNP: 955 pg/mL — ABNORMAL HIGH (ref 0–301)

## 2021-07-06 ENCOUNTER — Encounter: Payer: Self-pay | Admitting: Family Medicine

## 2021-07-06 ENCOUNTER — Telehealth (INDEPENDENT_AMBULATORY_CARE_PROVIDER_SITE_OTHER): Payer: 59 | Admitting: Family Medicine

## 2021-07-06 ENCOUNTER — Other Ambulatory Visit: Payer: Self-pay | Admitting: Cardiology

## 2021-07-06 DIAGNOSIS — U071 COVID-19: Secondary | ICD-10-CM | POA: Diagnosis not present

## 2021-07-06 DIAGNOSIS — J449 Chronic obstructive pulmonary disease, unspecified: Secondary | ICD-10-CM | POA: Diagnosis not present

## 2021-07-06 DIAGNOSIS — J9611 Chronic respiratory failure with hypoxia: Secondary | ICD-10-CM

## 2021-07-06 DIAGNOSIS — J069 Acute upper respiratory infection, unspecified: Secondary | ICD-10-CM | POA: Insufficient documentation

## 2021-07-06 HISTORY — DX: Acute upper respiratory infection, unspecified: J06.9

## 2021-07-06 LAB — POCT INFLUENZA A/B
Influenza A, POC: NEGATIVE
Influenza B, POC: NEGATIVE

## 2021-07-06 LAB — POCT RAPID STREP A (OFFICE): Rapid Strep A Screen: NEGATIVE

## 2021-07-06 LAB — POC COVID19 BINAXNOW: SARS Coronavirus 2 Ag: POSITIVE — AB

## 2021-07-06 MED ORDER — MOLNUPIRAVIR EUA 200MG CAPSULE: 4.0000 | ORAL_CAPSULE | Freq: Two times a day (BID) | ORAL | 0 refills | Status: AC

## 2021-07-06 NOTE — Assessment & Plan Note (Addendum)
>>  ASSESSMENT AND PLAN FOR CHRONIC HYPOXEMIC RESPIRATORY FAILURE ([place]) WRITTEN ON 07/06/2021 12:46 PM BY Willo Yoon, MD  Uses oxygen at night only  If increased sob, pt to wear oxygen during the day.   >>ASSESSMENT AND PLAN FOR COPD (CHRONIC OBSTRUCTIVE PULMONARY DISEASE) ([place]) WRITTEN ON 07/06/2021 12:46 PM BY Myrlene Riera, MD  Continue Breo.  Use duoneb four times a day if wheezing/sob.

## 2021-07-06 NOTE — Assessment & Plan Note (Signed)
Continue Breo.  Use duoneb four times a day if wheezing/sob.

## 2021-07-06 NOTE — Assessment & Plan Note (Signed)
Start molnupiravir.  Rest, fluids.  Eat 3 meals per day.  Isolate for 10 days.  If worsens, may need to go to hospital.

## 2021-07-06 NOTE — Progress Notes (Signed)
Acute Office Visit  Subjective:    Patient ID: [name], female    DOB: [birth year], 65 y.o.   MRN: [id number]  Chief Complaint  Patient presents with   Cough    HPI: Patient is in today for cough, congestion, sob, sore throat, achy all over, and headaches x 1 day. Fevers 101. Pt has had no covid 19 shots. She has had a flu shot.   Past Medical History:  Diagnosis Date   Abdominal pain 10/17/2020   Abnormality of gait due to impairment of balance 11/22/2020   Acute diverticulitis 10/17/2020   Admission for long-term opiate analgesic use 10/24/2019   Arthritis of right hip 05/29/2016   Formatting of this note might be different from the original. Added automatically from request for surgery [id number]   BMI 26.0-26.9,adult 03/29/2020   Bronchial asthma    Cardiomyopathy ([place])    Overview:  Ejection fraction 45% in 2015 Ejection fraction 30 to 35% in November 2018   Chronic back pain    Chronic hypoxemic respiratory failure ([place]) 10/24/2019   Chronic narcotic use 03/23/2015   Chronic pain of both knees 12/21/2018   Added automatically from request for surgery [id number]  Formatting of this note might be different from the original. 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[place]) 07/13/2020   Hypotension 05/17/2020   ICD (implantable cardiac defibrillator) in place 2012   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   Ileus following gastrointestinal surgery ([place]) 03/26/2015   Major depressive disorder, single episode, moderate ([place]) 10/24/2019   Malnutrition of moderate degree ([place]) 10/20/2020   Mesenteric ischemia ([place]) 10/16/2020   Migraine without aura with status migrainosus 10/24/2019   Mixed incontinence 10/20/2020   Myocardial infarction ([facility])    Nausea & vomiting    Nausea alone 04/19/2014   Obstructive chronic bronchitis with exacerbation ([place]) 10/25/2019   Other spondylosis with radiculopathy, lumbar region 10/24/2019   Persistent vomiting 05/17/2020   Presence of left artificial hip joint 10/24/2019   PTSD (post-traumatic stress disorder)    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 03/23/2015 04/19/2014   Senile osteoporosis 10/24/2019   Sepsis ([place]) 10/17/2020   Small bowel obstruction ([place])    Thyroid disease     Past Surgical History:  Procedure Laterality Date   APPENDECTOMY     CARDIAC CATHETERIZATION     CARPAL TUNNEL RELEASE     CESAREAN SECTION     CHF s/p AICD   12/2010   CHOLECYSTECTOMY     COLONOSCOPY  10/16/2015   Mild colonic diverticulosis, predominantly in the left colon. Small internal hemrrhoids. Otherwise normal colonoscopy   CORONARY ANGIOPLASTY     ESOPHAGOGASTRODUODENOSCOPY  02/04/2014   Mild gastritis. Otherwise normal EGD   HAND SURGERY     ICD GENERATOR  CHANGEOUT N/A 05/21/2019   Procedure: ICD GENERATOR CHANGEOUT;  Surgeon: Constance Haw, MD;  Location: [facility];  Service: Cardiovascular;  Laterality: N/A;   ICD IMPLANT     Medtronic   intestinal blockage 2011     LAPAROSCOPIC ASSISTED VENTRAL HERNIA REPAIR N/A 03/23/2015   Procedure: LAPAROSCOPIC VENTRAL WALL HERNIA REPAIR;  Surgeon:  Michael Boston, MD;  Location: [facility];  Service: General;  Laterality: N/A;  With MESH   LAPAROSCOPIC LYSIS OF ADHESIONS N/A 03/23/2015   Procedure: LAPAROSCOPIC LYSIS OF ADHESIONS;  Surgeon: Michael Boston, MD;  Location: [facility];  Service: General;  Laterality: N/A;   LSCS      x2   NASAL SEPTUM SURGERY     NECK SURGERY     fused   TONSILLECTOMY     ULNAR NERVE TRANSPOSITION  01/23/2012   Procedure: ULNAR NERVE DECOMPRESSION/TRANSPOSITION;  Surgeon: Ophelia Charter, MD;  Location: [facility];  Service: Neurosurgery;  Laterality: Left;  LEFT ulnar nerve decompression    Family History  Problem Relation Age of Onset   Heart attack Other    Cancer Other    Heart failure Other    Hypertension Mother    Heart attack Mother    Alcohol abuse Father    Hypertension Father    Heart attack Father    Anesthesia problems Neg Hx    Hypotension Neg Hx    Malignant hyperthermia Neg Hx    Pseudochol deficiency Neg Hx     Social History   Socioeconomic History   Marital status: Single    Spouse name: Not on file   Number of children: 3   Years of education: Not on file   Highest education level: Not on file  Occupational History   Occupation: disabled  Tobacco Use   Smoking status: Former    Packs/day: 0.50    Years: 30.00    Pack years: 15.00    Types: Cigarettes    Quit date: 03/2020    Years since quitting: 1.2   Smokeless tobacco: Never  Vaping Use   Vaping Use: Never used  Substance and Sexual Activity   Alcohol use: Yes    Alcohol/week: 2.0 standard drinks    Types: 1 Cans of beer, 1 Standard drinks or equivalent per week    Comment: seldom   Drug use: No   Sexual activity: Not Currently  Other Topics Concern   Not on file  Social History Narrative   One level home with boyfriend   Caffeine - coffee 1-2 cups/day; Green tea 4-5 bottles a day   Exercise - some    Right handed      Social Determinants of Health   Financial Resource Strain: Low Risk     Difficulty of Paying Living Expenses: Not hard at all  Food Insecurity: No Food Insecurity   Worried About Charity fundraiser in the Last Year: Never true   Ran Out of Food in the Last Year: Never true  Transportation Needs: Unmet Transportation Needs   Lack of Transportation (Medical): Not on file   Lack of Transportation (Non-Medical): Yes  Physical Activity: Inactive   Days of Exercise per Week: 0 days   Minutes of Exercise per Session: 0 min  Stress: Stress Concern Present   Feeling of Stress : Rather much  Social Connections: Moderately Integrated   Frequency of Communication with Friends and Family: More than three times a week   Frequency of Social Gatherings with  Friends and Family: Never   Attends Religious Services: 1 to 4 times per year   Active Member of [employer] or Organizations: No   Attends Music therapist: Never   Marital Status: Married  Human resources officer Violence: Unknown   Fear of Current or Ex-Partner: No   Emotionally Abused: No   Physically Abused: No   Sexually Abused: Not on file    Outpatient Medications Prior to Visit  Medication Sig Dispense Refill   ARIPiprazole (ABILIFY) 2 MG tablet Take 1 tablet (2 mg total) by mouth daily. 30 tablet 3   aspirin 81 MG tablet Take 81 mg by mouth daily.     Buprenorphine HCl (BELBUCA) 900 MCG FILM Place 900 mcg inside cheek every 12 (twelve) hours.     Buprenorphine HCl-Naloxone HCl 8-2 MG FILM Place 1 strip under the tongue 2 (two) times daily.     carvedilol (COREG) 3.125 MG tablet Take 1 tablet (3.125 mg total) by mouth 2 (two) times daily with a meal. 180 tablet 2   celecoxib (CELEBREX) 200 MG capsule TAKE ONE CAPSULE BY MOUTH EVERY MORNING (Patient taking differently: Take 200 mg by mouth daily.) 90 capsule 2   cyclobenzaprine (FLEXERIL) 10 MG tablet Take 10 mg by mouth 3 (three) times daily as needed for muscle spasms.     denosumab (PROLIA) 60 MG/ML SOSY injection Inject 60 mg into the skin every 6  (six) months. 180 mL 2   diazepam (VALIUM) 5 MG tablet Take 1 tablet (5 mg total) by mouth 3 (three) times daily as needed for anxiety. 90 tablet 3   diclofenac Sodium (VOLTAREN) 1 % GEL Apply 2 g topically 4 (four) times daily. 50 g 4   diphenoxylate-atropine (LOMOTIL) 2.5-0.025 MG tablet Take 1 tablet by mouth 4 (four) times daily as needed for diarrhea or loose stools. 30 tablet 0   ENTRESTO 24-26 MG TAKE ONE TABLET BY MOUTH TWICE DAILY (Patient taking differently: Take 1 tablet by mouth 2 (two) times daily.) 180 tablet 2   EPINEPHRINE 0.3 mg/0.3 mL IJ SOAJ injection Inject 0.3 mLs (0.3 mg total) into the muscle as needed for anaphylaxis. 1 each 2   Erenumab-aooe (AIMOVIG) 140 MG/ML SOAJ Inject 140 mg into the skin every 28 (twenty-eight) days. 1.12 mL 5   esomeprazole (NEXIUM) 20 MG capsule TAKE ONE CAPSULE BY MOUTH ONCE DAILY (Patient taking differently: Take 20 mg by mouth daily.) 90 capsule 2   fluticasone (FLONASE) 50 MCG/ACT nasal spray Place 2 sprays into both nostrils daily as needed (congestion). (Patient taking differently: Place 2 sprays into both nostrils daily as needed for allergies (congestion).) 18 mL 2   fluticasone furoate-vilanterol (BREO ELLIPTA) 100-25 MCG/ACT AEPB Inhale 1 puff into the lungs daily. 1 each 11   furosemide (LASIX) 40 MG tablet TAKE ONE TABLET BY MOUTH EVERY MORNING MAY take 0.5 tablet in THE afternoon IF needed] (Patient taking differently: Take 20 mg by mouth daily as needed for edema or fluid.) 90 tablet 2   gabapentin (NEURONTIN) 800 MG tablet Take 1 tablet (800 mg total) by mouth 3 (three) times daily. Patient usually takes twice a day (Patient taking differently: Take 800 mg by mouth 2 (two) times daily.) 270 tablet 2   ipratropium (ATROVENT) 0.02 % nebulizer solution Take 2.5 mLs (0.5 mg total) by nebulization every 4 (four) hours as needed for wheezing or shortness of breath. 150 mL 2   ipratropium-albuterol (DUONEB) 0.5-2.5 (3) MG/3ML SOLN Inhale 3 mLs  into the lungs every  6 (six) hours as needed (sob). 270 mL 3   levocetirizine (XYZAL) 5 MG tablet Take 5 mg by mouth daily as needed for allergies.     linaclotide (LINZESS) 290 MCG CAPS capsule Take 1 capsule (290 mcg total) by mouth daily before breakfast. 90 capsule 2   LINZESS 145 MCG CAPS capsule Take 1 capsule (145 mcg total) by mouth daily as needed (indigestion). 90 capsule 2   LORazepam (ATIVAN) 2 MG tablet Take 2 mg by mouth at bedtime as needed for anxiety.     megestrol (MEGACE) 20 MG tablet Take 1 tablet (20 mg total) by mouth daily. 30 tablet 3   Multiple Vitamin (MULTIVITAMIN WITH MINERALS) TABS tablet Take 1 tablet by mouth daily. (Patient taking differently: Take 1 tablet by mouth daily. Unknown strength)     naloxone (NARCAN) nasal spray 4 mg/0.1 mL Place 1 spray into the nose once.     nitroGLYCERIN (NITROSTAT) 0.4 MG SL tablet Place 1 tablet (0.4 mg total) under the tongue every 5 (five) minutes as needed for chest pain. 25 tablet 6   ondansetron (ZOFRAN) 4 MG tablet Take 1 tablet (4 mg total) by mouth every 8 (eight) hours as needed for nausea or vomiting. 20 tablet 0   rosuvastatin (CRESTOR) 20 MG tablet TAKE ONE TABLET BY MOUTH EVERY MORNING (Patient taking differently: Take 20 mg by mouth daily.) 90 tablet 2   sertraline (ZOLOFT) 50 MG tablet TAKE 1 TABLET BY MOUTH DAILY (Patient taking differently: Take 50 mg by mouth daily.) 90 tablet 2   tiZANidine (ZANAFLEX) 4 MG tablet Take 4 mg by mouth at bedtime.     topiramate (TOPAMAX) 50 MG tablet Take 2 tablets (100 mg total) by mouth at bedtime. 240 tablet 2   UBRELVY 100 MG TABS TAKE 1 TABLET BY MOUTH BY MOUTH AS NEEDED( MAY REPEAT 1 TABLET AFTER 2 HOURS IF NEEDED, MAXIMUM 2 TABLETS IN 24 HOURS) (Patient taking differently: Take 1 tablet by mouth as directed. Take 1 tab po as needed, may repeat 1 tab after 2 hour as needed max 2 tabs in 24 hours) 16 tablet 3   ranolazine (RANEXA) 500 MG 12 hr tablet TAKE 1 TABLET(500 MG) BY MOUTH  TWICE DAILY (Patient taking differently: Take 500 mg by mouth 2 (two) times daily. TAKE 1 TABLET(500 MG) BY MOUTH TWICE DAILY) 180 tablet 2   No facility-administered medications prior to visit.    No Known Allergies  Review of Systems  Constitutional:  Positive for fever. Negative for chills and fatigue.  HENT:  Positive for congestion and sore throat. Negative for ear pain.   Respiratory:  Positive for cough and shortness of breath.   Cardiovascular:  Negative for chest pain.      Objective:    Physical Exam HENT:     Mouth/Throat:     Comments: Hoarse. Pulmonary:     Effort: Pulmonary effort is normal.     Breath sounds: Normal breath sounds.  Neurological:     Mental Status: She is alert.   Examined in parking lot.  Unable to get VS. Pulse oximetry would not read due to nail polish.  There were no vitals taken for this visit. Wt Readings from Last 3 Encounters:  06/26/21 120 lb (54.4 kg)  06/26/21 118 lb 14.4 oz (53.9 kg)  05/22/21 115 lb 12.8 oz (52.5 kg)    Health Maintenance Due  Topic Date Due   COVID-19 Vaccine (1) Never done   Hepatitis C Screening  Never done   TETANUS/TDAP  Never done   Zoster Vaccines- Shingrix (1 of 2) Never done   MAMMOGRAM  09/22/2020   Pneumonia Vaccine 14+ Years old (3 - PCV) 06/02/2021    There are no preventive care reminders to display for this patient.   Lab Results  Component Value Date   TSH 0.816 02/15/2021   Lab Results  Component Value Date   WBC 9.9 05/22/2021   HGB 11.6 05/22/2021   HCT 34.3 05/22/2021   MCV 91 05/22/2021   PLT 345 05/22/2021   Lab Results  Component Value Date   NA 138 06/26/2021   K 4.3 06/26/2021   CO2 21 06/26/2021   GLUCOSE 93 06/26/2021   BUN 21 06/26/2021   CREATININE 1.23 (H) 06/26/2021   BILITOT 0.2 05/22/2021   ALKPHOS 68 05/22/2021   AST 11 05/22/2021   ALT 5 05/22/2021   PROT 6.7 05/22/2021   ALBUMIN 4.1 05/22/2021   CALCIUM 10.0 06/26/2021   ANIONGAP 6 10/29/2020    EGFR 48 (L) 06/26/2021   Lab Results  Component Value Date   CHOL 171 05/22/2021   Lab Results  Component Value Date   HDL 63 05/22/2021   Lab Results  Component Value Date   LDLCALC 91 05/22/2021   Lab Results  Component Value Date   TRIG 93 05/22/2021   Lab Results  Component Value Date   CHOLHDL 2.7 05/22/2021   No results found for: HGBA1C     Assessment & Plan:   Problem List Items Addressed This Visit       Respiratory   COPD (chronic obstructive pulmonary disease) ([place])    Continue Breo.  Use duoneb four times a day if wheezing/sob.       Chronic hypoxemic respiratory failure ([facility])    Uses oxygen at night only  If increased sob, pt to wear oxygen during the day.       Upper respiratory tract infection due to COVID-19 virus - Primary    Start molnupiravir.  Rest, fluids.  Eat 3 meals per day.  Isolate for 10 days.  If worsens, may need to go to hospital.       Relevant Medications   molnupiravir EUA (LAGEVRIO) 200 mg CAPS capsule   Other Relevant Orders   POCT rapid strep A (Completed)   POC COVID-19 BinaxNow (Completed)   Influenza A/B (Completed)   Meds ordered this encounter  Medications   molnupiravir EUA (LAGEVRIO) 200 mg CAPS capsule    Sig: Take 4 capsules (800 mg total) by mouth 2 (two) times daily for 5 days.    Dispense:  40 capsule    Refill:  0    Orders Placed This Encounter  Procedures   POCT rapid strep A   POC COVID-19 BinaxNow   Influenza A/B      Follow-up: No follow-ups on file.  An After Visit Summary was printed and given to the patient.  Rochel Brome, MD [facility] [phone]

## 2021-07-11 ENCOUNTER — Telehealth: Payer: Self-pay | Admitting: Cardiology

## 2021-07-11 NOTE — Telephone Encounter (Signed)
Spoke to patient she wants a list of all cardiac diagnoses and estimated time of the diagnosis. Will get this ready for her.

## 2021-07-11 NOTE — Telephone Encounter (Signed)
Patient requesting a a note stating what she is diagnosed with and when she was diagnosed. 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 She says she will need to send it to a couple places.

## 2021-07-12 ENCOUNTER — Telehealth: Payer: Self-pay | Admitting: Cardiology

## 2021-07-12 ENCOUNTER — Telehealth: Payer: Self-pay

## 2021-07-12 NOTE — Telephone Encounter (Signed)
Patient been diagnose with Covid calling to see what she can take. Please advise

## 2021-07-12 NOTE — Telephone Encounter (Signed)
Patient calling as she is really congested and "yellow coming out of my nose." Currently wearing 2L of oxygen at night. Asking what medication she can take for this with her health issues.  Callback # [place] 07/12/21 3:10 PM

## 2021-07-12 NOTE — Telephone Encounter (Signed)
Patient tested positive for covid 6 days ago and wanted to know what OTC medication could she take since she has heart disease.

## 2021-07-12 NOTE — Telephone Encounter (Signed)
Advised pt to call PCP for recommendations. Pt verbalized understanding and had no additional questions.

## 2021-07-12 NOTE — Telephone Encounter (Signed)
Made pt aware.   [name], [place] 07/12/21 3:41 PM

## 2021-07-13 ENCOUNTER — Ambulatory Visit: Payer: 59 | Admitting: Gastroenterology

## 2021-07-13 ENCOUNTER — Other Ambulatory Visit: Payer: Self-pay | Admitting: Neurology

## 2021-07-13 ENCOUNTER — Other Ambulatory Visit: Payer: Self-pay

## 2021-07-13 ENCOUNTER — Other Ambulatory Visit: Payer: Self-pay | Admitting: Legal Medicine

## 2021-07-13 DIAGNOSIS — I5022 Chronic systolic (congestive) heart failure: Secondary | ICD-10-CM

## 2021-07-13 DIAGNOSIS — M4726 Other spondylosis with radiculopathy, lumbar region: Secondary | ICD-10-CM

## 2021-07-13 NOTE — Telephone Encounter (Signed)
Patient returned call. I advised of note below

## 2021-07-13 NOTE — Telephone Encounter (Signed)
Left message for patient to return call. The list she requested is at the front desk in [place].

## 2021-07-16 ENCOUNTER — Ambulatory Visit (INDEPENDENT_AMBULATORY_CARE_PROVIDER_SITE_OTHER): Payer: 59

## 2021-07-16 NOTE — Progress Notes (Signed)
Chronic Care Management Pharmacy Note  07/16/2021 Name:  [name] MRN:  [id number] DOB:  1956/02/25  Subjective: [name] is an 65 y.o. year old female who is a primary patient of Henrene Pastor Zeb Comfort, MD.  The CCM team was consulted for assistance with disease management and care coordination needs.    Summary: Pleasant 65 year old female presents for f/u CCM visit. She has 3 kids, 2 boys and a girl, as well as 4 grandkids. For fun, she is most happy when she goes to her grandkid's football games, he's a Paramedic. When talking about her family and health, she had to pause and hold back tears multiple times.   Plan Recommendation:  Spent majority of visit counseling on potentially getting Covid vaccine. She is worried, with her heart issues, that the vaccine will cause a heart attack. Tried to counsel on how Covid has a higher chance than the vaccine to cause problems, especially since she's gotten Covid twice now. Patient still refused vaccine but will try to talk again each visit  Engaged with patient by telephone for follow up visit in response to provider referral for pharmacy case management and/or care coordination services.   Consent to Services:  The patient was given information about Chronic Care Management services, agreed to services, and gave verbal consent prior to initiation of services.  Please see initial visit note for detailed documentation.   Patient Care Team: Lillard Anes, MD as PCP - General (Family Medicine) Constance Haw, MD as PCP - Electrophysiology (Cardiology) Park Liter, MD as PCP - Cardiology (Cardiology) Newman Pies, MD as Consulting Physician (Neurosurgery) Park Liter, MD as Consulting Physician (Cardiology) Michael Boston, MD as Consulting Physician (General Surgery) Pieter Partridge, DO as Consulting Physician (Neurology) Burnice Logan, [facility] (Inactive) as Pharmacist (Pharmacist) Ronnette Juniper, MD as  Consulting Physician (Gastroenterology)  Recent office visits: 10/16/2020 - abdominal pain. Ordered CT scan.  10/05/2020 - sick with cough and chills. Flu negative and COVID treat with Levaquin.  Recent consult visits:   Hospital visits: 10/17/2020 - ED to hospital admission Diverticulitis.   Objective:  Lab Results  Component Value Date   CREATININE 1.23 (H) 06/26/2021   BUN 21 06/26/2021   GFRNONAA >60 10/29/2020   GFRAA 57 (L) 09/05/2020   NA 138 06/26/2021   K 4.3 06/26/2021   CALCIUM 10.0 06/26/2021   CO2 21 06/26/2021    No results found for: HGBA1C, FRUCTOSAMINE, GFR, MICROALBUR  Last diabetic Eye exam: No results found for: HMDIABEYEEXA  Last diabetic Foot exam: No results found for: HMDIABFOOTEX   Lab Results  Component Value Date   CHOL 171 05/22/2021   HDL 63 05/22/2021   LDLCALC 91 05/22/2021   TRIG 93 05/22/2021   CHOLHDL 2.7 05/22/2021    Hepatic Function Latest Ref Rng & Units 05/22/2021 02/15/2021 12/29/2020  Total Protein 6.0 - 8.5 g/dL 6.7 6.4 6.5  Albumin 3.8 - 4.8 g/dL 4.1 4.1 3.8  AST 0 - 40 IU/L '11 15 9  ' ALT 0 - 32 IU/L '5 7 7  ' Alk Phosphatase 44 - 121 IU/L 68 73 70  Total Bilirubin 0.0 - 1.2 mg/dL 0.2 0.3 <0.2    Lab Results  Component Value Date/Time   TSH 0.816 02/15/2021 10:55 AM   TSH 0.987 07/05/2020 12:14 PM    CBC Latest Ref Rng & Units 05/22/2021 02/15/2021 12/13/2020  WBC 3.4 - 10.8 x10E3/uL 9.9 10.3 7.9  Hemoglobin 11.1 - 15.9 g/dL  11.6 12.5 11.2  Hematocrit 34.0 - 46.6 % 34.3 37.5 34.2  Platelets 150 - 450 x10E3/uL 345 286 348    No results found for: VD25OH  Clinical ASCVD: Yes  The ASCVD Risk score (Arnett DK, et al., 2019) failed to calculate for the following reasons:   The patient has a prior MI or stroke diagnosis    Depression screen [facility] 2/9 06/26/2021 05/22/2021 03/27/2021  Decreased Interest '2 1 2  ' Down, Depressed, Hopeless 1 0 2  PHQ - 2 Score '3 1 4  ' Altered sleeping '3 3 3  ' Tired, decreased energy 0 3 3  Change in  appetite '3 3 3  ' Feeling bad or failure about yourself  1 0 0  Trouble concentrating 0 0 3  Moving slowly or fidgety/restless 0 0 2  Suicidal thoughts 1 0 0  PHQ-9 Score '11 10 18  ' Difficult doing work/chores Somewhat difficult Not difficult at all Very difficult  Some recent data might be hidden      Social History   Tobacco Use  Smoking Status Former   Packs/day: 0.50   Years: 30.00   Pack years: 15.00   Types: Cigarettes   Quit date: 03/2020   Years since quitting: 1.3  Smokeless Tobacco Never   BP Readings from Last 3 Encounters:  06/26/21 100/64  06/26/21 94/64  05/22/21 132/78   Pulse Readings from Last 3 Encounters:  06/26/21 94  06/26/21 85  05/22/21 86   Wt Readings from Last 3 Encounters:  06/26/21 120 lb (54.4 kg)  06/26/21 118 lb 14.4 oz (53.9 kg)  05/22/21 115 lb 12.8 oz (52.5 kg)    Assessment/Interventions: Review of patient past medical history, allergies, medications, health status, including review of consultants reports, laboratory and other test data, was performed as part of comprehensive evaluation and provision of chronic care management services.   SDOH:  (Social Determinants of Health) assessments and interventions performed: Yes   CCM Care Plan  No Known Allergies  Medications Reviewed Today     Reviewed by Rochel Brome, MD (Physician) on 07/06/21 at 1248  Med List Status: <None>   Medication Order Taking? Sig Documenting Provider Last Dose Status Informant  ARIPiprazole (ABILIFY) 2 MG tablet [id number] No Take 1 tablet (2 mg total) by mouth daily. Lillard Anes, MD Taking Active   aspirin 81 MG tablet [id number] No Take 81 mg by mouth daily. [provider] Taking Active Self  Buprenorphine HCl (BELBUCA) 900 MCG FILM [id number] No Place 900 mcg inside cheek every 12 (twelve) hours. [provider] Taking Active Self  Buprenorphine HCl-Naloxone HCl 8-2 MG FILM [id number] No Place 1 strip under the tongue 2 (two)  times daily. [provider] Taking Active   carvedilol (COREG) 3.125 MG tablet [id number] No Take 1 tablet (3.125 mg total) by mouth 2 (two) times daily with a meal. Lillard Anes, MD Taking Active   celecoxib (CELEBREX) 200 MG capsule [id number] No TAKE ONE CAPSULE BY MOUTH EVERY MORNING  Patient taking differently: Take 200 mg by mouth daily.   Lillard Anes, MD Taking Active   cyclobenzaprine (FLEXERIL) 10 MG tablet [id number] No Take 10 mg by mouth 3 (three) times daily as needed for muscle spasms. [provider] Taking Active Self  denosumab (PROLIA) 60 MG/ML SOSY injection [id number] No Inject 60 mg into the skin every 6 (six) months. Lillard Anes, MD Taking Active Self  diazepam (VALIUM) 5 MG tablet [id number]  Take 1 tablet (5 mg total)  by mouth 3 (three) times daily as needed for anxiety. Lillard Anes, MD  Active   diclofenac Sodium (VOLTAREN) 1 % GEL [id number] No Apply 2 g topically 4 (four) times daily. Lillard Anes, MD Taking Active   diphenoxylate-atropine (LOMOTIL) 2.5-0.025 MG tablet [id number] No Take 1 tablet by mouth 4 (four) times daily as needed for diarrhea or loose stools. Lillard Anes, MD Taking Active Self  ENTRESTO 24-26 MG [id number] No TAKE ONE TABLET BY MOUTH TWICE DAILY  Patient taking differently: Take 1 tablet by mouth 2 (two) times daily.   Lillard Anes, MD Taking Active   EPINEPHRINE 0.3 mg/0.3 mL IJ SOAJ injection [id number] No Inject 0.3 mLs (0.3 mg total) into the muscle as needed for anaphylaxis. Lillard Anes, MD Taking Active Self  Erenumab-aooe (AIMOVIG) 140 MG/ML Darden Palmer [id number] No Inject 140 mg into the skin every 28 (twenty-eight) days. Pieter Partridge, DO Taking Active   esomeprazole (NEXIUM) 20 MG capsule [id number] No TAKE ONE CAPSULE BY MOUTH ONCE DAILY  Patient taking differently: Take 20 mg by mouth daily.   Lillard Anes, MD Taking Active   fluticasone  [facility]) 50 MCG/ACT nasal spray [id number] No Place 2 sprays into both nostrils daily as needed (congestion).  Patient taking differently: Place 2 sprays into both nostrils daily as needed for allergies (congestion).   Lillard Anes, MD Taking Active   fluticasone furoate-vilanterol (BREO ELLIPTA) 100-25 MCG/ACT AEPB [id number]  Inhale 1 puff into the lungs daily. Lillard Anes, MD  Active   furosemide (LASIX) 40 MG tablet [id number] No TAKE ONE TABLET BY MOUTH EVERY MORNING MAY take 0.5 tablet in THE afternoon IF needed]  Patient taking differently: Take 20 mg by mouth daily as needed for edema or fluid.   Lillard Anes, MD Taking Active   gabapentin (NEURONTIN) 800 MG tablet [id number] No Take 1 tablet (800 mg total) by mouth 3 (three) times daily. Patient usually takes twice a day  Patient taking differently: Take 800 mg by mouth 2 (two) times daily.   Lillard Anes, MD Taking Active   ipratropium (ATROVENT) 0.02 % nebulizer solution [id number] No Take 2.5 mLs (0.5 mg total) by nebulization every 4 (four) hours as needed for wheezing or shortness of breath. Lillard Anes, MD Taking Active Self  ipratropium-albuterol (DUONEB) 0.5-2.5 (3) MG/3ML SOLN [id number] No Inhale 3 mLs into the lungs every 6 (six) hours as needed (sob). Lillard Anes, MD Taking Active   levocetirizine Harlow Ohms) 5 MG tablet [id number] No Take 5 mg by mouth daily as needed for allergies. [provider] Taking Active Self  linaclotide Rolan Lipa) 290 MCG CAPS capsule [id number] No Take 1 capsule (290 mcg total) by mouth daily before breakfast. Lillard Anes, MD Taking Active   LINZESS 145 MCG CAPS capsule [id number] No Take 1 capsule (145 mcg total) by mouth daily as needed (indigestion). Lillard Anes, MD Taking Active   LORazepam (ATIVAN) 2 MG tablet [id number] No Take 2 mg by mouth at bedtime as needed for anxiety. [provider] Taking Active    megestrol (MEGACE) 20 MG tablet [id number] No Take 1 tablet (20 mg total) by mouth daily. Lillard Anes, MD Taking Active   molnupiravir EUA (LAGEVRIO) 200 mg CAPS capsule [id number] Yes Take 4 capsules (800 mg total) by mouth 2 (two) times daily for 5 days. Cox, Kirsten, MD  Active   Multiple Vitamin (MULTIVITAMIN WITH MINERALS) TABS tablet [id number] No Take 1 tablet by  mouth daily.  Patient taking differently: Take 1 tablet by mouth daily. Unknown strength   Eugenie Filler, MD Taking Active   naloxone [facility]) nasal spray 4 mg/0.1 mL [id number] No Place 1 spray into the nose once. [provider] Taking Active   nitroGLYCERIN (NITROSTAT) 0.4 MG SL tablet [id number] No Place 1 tablet (0.4 mg total) under the tongue every 5 (five) minutes as needed for chest pain. Lillard Anes, MD Taking Active Self  ondansetron [facility]) 4 MG tablet [id number] No Take 1 tablet (4 mg total) by mouth every 8 (eight) hours as needed for nausea or vomiting. Lillard Anes, MD Taking Active Self  ranolazine (RANEXA) 500 MG 12 hr tablet [id number]  TAKE 1 TABLET(500 MG) BY MOUTH TWICE DAILY Park Liter, MD  Active   rosuvastatin (CRESTOR) 20 MG tablet [id number] No TAKE ONE TABLET BY MOUTH EVERY MORNING  Patient taking differently: Take 20 mg by mouth daily.   Lillard Anes, MD Taking Active   sertraline (ZOLOFT) 50 MG tablet [id number] No TAKE 1 TABLET BY MOUTH DAILY  Patient taking differently: Take 50 mg by mouth daily.   Lillard Anes, MD Taking Active   tiZANidine (ZANAFLEX) 4 MG tablet [id number] No Take 4 mg by mouth at bedtime. [provider] Taking Active   topiramate (TOPAMAX) 50 MG tablet [id number] No Take 2 tablets (100 mg total) by mouth at bedtime. Lillard Anes, MD Taking Active   UBRELVY 100 MG TABS [id number] No TAKE 1 TABLET BY MOUTH BY MOUTH AS NEEDED( MAY REPEAT 1 TABLET AFTER 2 HOURS IF NEEDED, MAXIMUM 2 TABLETS IN 24 HOURS)   Patient taking differently: Take 1 tablet by mouth as directed. Take 1 tab po as needed, may repeat 1 tab after 2 hour as needed max 2 tabs in 24 hours   Pieter Partridge, DO Taking Active             Patient Active Problem List   Diagnosis Date Noted   Upper respiratory tract infection due to COVID-19 virus 07/06/2021   Bronchial asthma 06/25/2021   Diverticulosis 06/25/2021   Wellness examination 03/27/2021   Abnormality of gait due to impairment of balance 11/22/2020   Diverticulitis    Nausea & vomiting    Mixed incontinence 10/20/2020   Malnutrition of moderate degree ([place]) 10/20/2020   Abdominal pain 10/17/2020   Sepsis ([name]) 10/17/2020   Acute diverticulitis 10/17/2020   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/16/2020   Thyroid disease    PTSD (post-traumatic stress disorder)    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[place]) 07/13/2020   Dehydration 05/17/2020   Hypotension 05/17/2020   Persistent vomiting 05/17/2020   Diarrhea 05/17/2020   Cystitis 05/17/2020   BMI 26.0-26.9,adult 03/29/2020   Obstructive chronic bronchitis with exacerbation ([place]) 10/25/2019   Admission for long-term opiate analgesic use 10/24/2019   Presence of left artificial hip joint 10/24/2019   Senile osteoporosis 10/24/2019   Chronic hypoxemic respiratory failure ([place]) 10/24/2019   Other spondylosis with radiculopathy, lumbar region 10/24/2019   Major depressive disorder, single episode, moderate ([place]) 10/24/2019   Migraine without aura with status migrainosus 10/24/2019   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   Chronic pain of both knees 73/41/9379   Chronic systolic congestive heart failure, NYHA class 2 ([place]) 06/19/2017   Dilated cardiomyopathy ([place]) 06/19/2017   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   Arthritis of right hip 05/29/2016   Coronary artery  disease involving native coronary artery of  native heart without angina pectoris 05/31/2015   Dyslipidemia 05/31/2015   Ileus following gastrointestinal surgery ([place]) 03/26/2015   Chronic narcotic use 03/23/2015   GERD (gastroesophageal reflux disease)    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 03/23/2015 04/19/2014   Small bowel obstruction ([place]) 04/19/2014   COPD (chronic obstructive pulmonary disease) ([place])    Cardiomyopathy ([place])    Chronic back pain    Myocardial infarction ([place]) 2012    Immunization History  Administered Date(s) Administered   Fluad Quad(high Dose 65+) 05/22/2021   Influenza Inj Mdck Quad With Preservative 07/21/2017   Influenza-Unspecified 06/26/2013, 05/26/2018   Pneumococcal Polysaccharide-23 12/20/2019, 06/02/2020    Conditions to be addressed/monitored:  Hypertension and Hyperlipidemia  Care Plan : ccm pharmacy care plan  Updates made by Lane Hacker, [place] since 07/16/2021 12:00 AM     Problem: chf and htn   Priority: High  Onset Date: 10/31/2020     Long-Range Goal: Disease Management   Start Date: 10/31/2020  Expected End Date: 10/31/2021  Recent Progress: On track  Priority: High  Note:    Current Barriers:  Unable to self administer medications as prescribed  Pharmacist Clinical Goal(s):  Over the next 90 days, patient will achieve ability to self administer medications as prescribed through use of pharmacy delivery services as evidenced by patient report through collaboration with PharmD and provider.   Interventions: 1:1 collaboration with Lillard Anes, MD regarding development and update of comprehensive plan of care as evidenced by provider attestation and co-signature Inter-disciplinary care team collaboration (see longitudinal plan of care) Comprehensive medication review performed; medication list updated in electronic medical record  Depression/Anxiety  -Uncontrolled -Current treatment: Diazepam 105m TID PRN Lorazepam 273m HS Sertraline 5087mripiprazole 2mg36m Topiramate 50mg17mdications previously tried/failed: N/A -PHQ9:  [facility] 05/22/2021 03/27/2021  Decreased Interest '2 1 2  ' Down, Depressed, Hopeless 1 0 2  PHQ - 2 Score '3 1 4  ' Altered sleeping '3 3 3  ' Tired, decreased energy 0 3 3  Change in appetite '3 3 3  ' Feeling bad or failure about yourself  1 0 0  Trouble concentrating 0 0 3  Moving slowly or fidgety/restless 0 0 2  Suicidal thoughts 1 0 0  PHQ-9 Score '11 10 18  ' Difficult doing work/chores Somewhat difficult Not difficult at all Very difficult  Some recent data might be hidden  -GAD7:  GAD 7 : Generalized Anxiety Score 12/22/2019  Nervous, Anxious, on Edge 0  Control/stop worrying 2  Worry too much - different things 2  Trouble relaxing 0  Restless 0  Easily annoyed or irritable 2  Afraid - awful might happen 2  Total GAD 7 Score 8  Anxiety Difficulty Somewhat difficult  -Educated on Benefits of medication for symptom control -Recommended to continue current medication   Heart Failure (Goal: manage symptoms and prevent exacerbations) -Controlled -Last ejection fraction: 30-35% (Date: 2018) -HF type: Systolic -NYHA Class: II (slight limitation of activity) -Current treatment: Carvedilol 3.125 mg bid Entresto 24-26 mg bid Furosemide 20 mg daily prn  Ranolazine 500 mg bid  -Medications previously tried: n/a  -Current home BP/HR readings: not checking since coming home from hospital -Current dietary habits: pretzels, tomato soup and potato chips -Current exercise habits: beginning PT this week -Educated on Benefits of medications for managing symptoms and prolonging life Importance of weighing daily; if you gain more than 3 pounds in one  day or 5 pounds in one week,    Importance of blood pressure control -Counseled on diet and exercise extensively Recommended to continue current medication Recommended beginning to check blood pressure daily   Collaborated with [facility]ist [facility].   Hyperlipidemia:  -Controlled -Current treatment: Rosuvastatin 12m -Medications previously tried: N/A  -Current dietary patterns: "Tries to eat healthy" -Current exercise habits: none -Educated on Cholesterol goals;  -Recommended to continue current medication   Chronic Pain -Managed by pain doctor -Controlled -Current treatment: Buprenorphine/Naloxone BID -Medications previously tried: None  -Pain Scale There were no vitals filed for this visit.  Aggravating Factors: Movement  Pain Type: Sharp  Defer to pain specialist    Patient Goals/Self-Care Activities Over the next 90 days, patient will:  - take medications as prescribed focus on medication adherence by using pill box check blood pressure daily, document, and provide at future appointments engage in dietary modifications by limiting salt in diet  Follow Up Plan: Telephone follow up appointment with care management team member scheduled for: July 2023  NArizona Constable PSherian ReinD. - [phone]       Medication Assistance: None required.  Patient affirms current coverage meets needs.  Patient's preferred pharmacy is:  [facility] [place] [place]Phone: [phone]: [phone] [facility] [address] [place]Phone: [phone]: [phone] [place] [address] [address]Phone: [phone]: [phone] Uses pill box? Yes Pt endorses good compliance  We discussed: Benefits of medication synchronization, packaging and delivery as well as enhanced pharmacist oversight with [facility]. Patient decided  to: Utilize [facility] pharmacy for medication synchronization, packaging and delivery  Care Plan and Follow Up Patient Decision:  Patient agrees to Care Plan and Follow-up.  Plan: Telephone follow up appointment with care management team member scheduled for:  July 2023  NArizona Constable PFloridaD. -- [phone]

## 2021-07-16 NOTE — Patient Instructions (Signed)
Visit Information   Goals Addressed   None    Patient Care Plan: ccm pharmacy care plan     Problem Identified: chf and htn   Priority: High  Onset Date: 10/31/2020     Long-Range Goal: Disease Management   Start Date: 10/31/2020  Expected End Date: 10/31/2021  Recent Progress: On track  Priority: High  Note:    Current Barriers:  Unable to self administer medications as prescribed  Pharmacist Clinical Goal(s):  Over the next 90 days, patient will achieve ability to self administer medications as prescribed through use of pharmacy delivery services as evidenced by patient report through collaboration with PharmD and provider.   Interventions: 1:1 collaboration with Lillard Anes, MD regarding development and update of comprehensive plan of care as evidenced by provider attestation and co-signature Inter-disciplinary care team collaboration (see longitudinal plan of care) Comprehensive medication review performed; medication list updated in electronic medical record  Depression/Anxiety  -Uncontrolled -Current treatment: Diazepam 5mg  TID PRN Lorazepam 2mg  HS Sertraline 50mg  Aripiprazole 2mg  QD Topiramate 50mg  -Medications previously tried/failed: N/A -PHQ9:  Depression screen [facility] 2/9 06/26/2021 05/22/2021 03/27/2021  Decreased Interest 2 1 2   Down, Depressed, Hopeless 1 0 2  PHQ - 2 Score 3 1 4   Altered sleeping 3 3 3   Tired, decreased energy 0 3 3  Change in appetite 3 3 3   Feeling bad or failure about yourself  1 0 0  Trouble concentrating 0 0 3  Moving slowly or fidgety/restless 0 0 2  Suicidal thoughts 1 0 0  PHQ-9 Score 11 10 18   Difficult doing work/chores Somewhat difficult Not difficult at all Very difficult  Some recent data might be hidden  -GAD7:  GAD 7 : Generalized Anxiety Score 12/22/2019  Nervous, Anxious, on Edge 0  Control/stop worrying 2  Worry too much - different things 2  Trouble relaxing 0  Restless 0  Easily annoyed or irritable 2   Afraid - awful might happen 2  Total GAD 7 Score 8  Anxiety Difficulty Somewhat difficult  -Educated on Benefits of medication for symptom control -Recommended to continue current medication   Heart Failure (Goal: manage symptoms and prevent exacerbations) -Controlled -Last ejection fraction: 30-35% (Date: 2018) -HF type: Systolic -NYHA Class: II (slight limitation of activity) -Current treatment: Carvedilol 3.125 mg bid Entresto 24-26 mg bid Furosemide 20 mg daily prn  Ranolazine 500 mg bid  -Medications previously tried: n/a  -Current home BP/HR readings: not checking since coming home from hospital -Current dietary habits: pretzels, tomato soup and potato chips -Current exercise habits: beginning PT this week -Educated on Benefits of medications for managing symptoms and prolonging life Importance of weighing daily; if you gain more than 3 pounds in one day or 5 pounds in one week,    Importance of blood pressure control -Counseled on diet and exercise extensively Recommended to continue current medication Recommended beginning to check blood pressure daily  Collaborated with [facility]ist [facility].   Hyperlipidemia:  -Controlled -Current treatment: Rosuvastatin 20mg  -Medications previously tried: N/A  -Current dietary patterns: "Tries to eat healthy" -Current exercise habits: none -Educated on Cholesterol goals;  -Recommended to continue current medication   Chronic Pain -Managed by pain doctor -Controlled -Current treatment: Buprenorphine/Naloxone BID -Medications previously tried: None  -Pain Scale There were no vitals filed for this visit.  Aggravating Factors: Movement  Pain Type: Sharp  Defer to pain specialist    Patient Goals/Self-Care Activities Over the next 90 days, patient will:  -  take medications as prescribed focus on medication adherence by using pill box check blood pressure daily, document, and provide at  future appointments engage in dietary modifications by limiting salt in diet  Follow Up Plan: Telephone follow up appointment with care management team member scheduled for: July 2023  [place] Constable, Sherian Rein.D. - [phone]       The patient verbalized understanding of instructions, educational materials, and care plan provided today and declined offer to receive copy of patient instructions, educational materials, and care plan.  The pharmacy team will reach out to the patient again over the next 90 days.   Lane Hacker, [facility]

## 2021-07-18 ENCOUNTER — Telehealth: Payer: Self-pay

## 2021-07-18 NOTE — Chronic Care Management (AMB) (Signed)
Chronic Care Management Pharmacy Assistant   Name: [name]  MRN: [id number] DOB: 24-Feb-1956  Reason for Encounter: Medication Review/ Medication coordination  Recent office visits:  None  Recent consult visits:  None  Hospital visits:  None in previous 6 months  Medications: Outpatient Encounter Medications as of 07/18/2021  Medication Sig   AIMOVIG 140 MG/ML SOAJ Inject 140g into THE SKIN every 28 DAYS   ARIPiprazole (ABILIFY) 2 MG tablet Take 1 tablet (2 mg total) by mouth daily.   aspirin 81 MG tablet Take 81 mg by mouth daily.   Buprenorphine HCl (BELBUCA) 900 MCG FILM Place 900 mcg inside cheek every 12 (twelve) hours.   Buprenorphine HCl-Naloxone HCl 8-2 MG FILM Place 1 strip under the tongue 2 (two) times daily.   carvedilol (COREG) 3.125 MG tablet Take 1 tablet (3.125 mg total) by mouth 2 (two) times daily with a meal.   celecoxib (CELEBREX) 200 MG capsule TAKE ONE CAPSULE BY MOUTH EVERY MORNING   cyclobenzaprine (FLEXERIL) 10 MG tablet Take 10 mg by mouth 3 (three) times daily as needed for muscle spasms.   denosumab (PROLIA) 60 MG/ML SOSY injection Inject 60 mg into the skin every 6 (six) months.   diazepam (VALIUM) 5 MG tablet Take 1 tablet (5 mg total) by mouth 3 (three) times daily as needed for anxiety.   diclofenac Sodium (VOLTAREN) 1 % GEL Apply 2 g topically 4 (four) times daily.   diphenoxylate-atropine (LOMOTIL) 2.5-0.025 MG tablet Take 1 tablet by mouth 4 (four) times daily as needed for diarrhea or loose stools.   ENTRESTO 24-26 MG TAKE ONE TABLET BY MOUTH TWICE DAILY   EPINEPHRINE 0.3 mg/0.3 mL IJ SOAJ injection Inject 0.3 mLs (0.3 mg total) into the muscle as needed for anaphylaxis.   esomeprazole (NEXIUM) 20 MG capsule TAKE ONE CAPSULE BY MOUTH ONCE DAILY (Patient taking differently: Take 20 mg by mouth daily.)   fluticasone (FLONASE) 50 MCG/ACT nasal spray Place 2 sprays into both nostrils daily as needed (congestion). (Patient taking differently:  Place 2 sprays into both nostrils daily as needed for allergies (congestion).)   fluticasone furoate-vilanterol (BREO ELLIPTA) 100-25 MCG/ACT AEPB Inhale 1 puff into the lungs daily.   furosemide (LASIX) 40 MG tablet TAKE ONE TABLET BY MOUTH EVERY MORNING MAY take 0.5 tablet in THE afternoon IF needed] (Patient taking differently: Take 20 mg by mouth daily as needed for edema or fluid.)   gabapentin (NEURONTIN) 800 MG tablet Take 1 tablet (800 mg total) by mouth 3 (three) times daily. Patient usually takes twice a day (Patient taking differently: Take 800 mg by mouth 2 (two) times daily.)   ipratropium (ATROVENT) 0.02 % nebulizer solution Take 2.5 mLs (0.5 mg total) by nebulization every 4 (four) hours as needed for wheezing or shortness of breath.   ipratropium-albuterol (DUONEB) 0.5-2.5 (3) MG/3ML SOLN Inhale 3 mLs into the lungs every 6 (six) hours as needed (sob).   levocetirizine (XYZAL) 5 MG tablet Take 5 mg by mouth daily as needed for allergies.   linaclotide (LINZESS) 290 MCG CAPS capsule Take 1 capsule (290 mcg total) by mouth daily before breakfast.   LINZESS 145 MCG CAPS capsule Take 1 capsule (145 mcg total) by mouth daily as needed (indigestion).   LORazepam (ATIVAN) 2 MG tablet Take 2 mg by mouth at bedtime as needed for anxiety.   megestrol (MEGACE) 20 MG tablet Take 1 tablet (20 mg total) by mouth daily.   Multiple Vitamin (MULTIVITAMIN WITH MINERALS) TABS  tablet Take 1 tablet by mouth daily. (Patient taking differently: Take 1 tablet by mouth daily. Unknown strength)   naloxone (NARCAN) nasal spray 4 mg/0.1 mL Place 1 spray into the nose once.   nitroGLYCERIN (NITROSTAT) 0.4 MG SL tablet Place 1 tablet (0.4 mg total) under the tongue every 5 (five) minutes as needed for chest pain.   ondansetron (ZOFRAN) 4 MG tablet Take 1 tablet (4 mg total) by mouth every 8 (eight) hours as needed for nausea or vomiting.   ranolazine (RANEXA) 500 MG 12 hr tablet TAKE 1 TABLET(500 MG) BY MOUTH TWICE  DAILY   rosuvastatin (CRESTOR) 20 MG tablet TAKE ONE TABLET BY MOUTH EVERY MORNING (Patient taking differently: Take 20 mg by mouth daily.)   sertraline (ZOLOFT) 50 MG tablet TAKE 1 TABLET BY MOUTH DAILY (Patient taking differently: Take 50 mg by mouth daily.)   tiZANidine (ZANAFLEX) 4 MG tablet Take 4 mg by mouth at bedtime.   topiramate (TOPAMAX) 50 MG tablet Take 2 tablets (100 mg total) by mouth at bedtime.   UBRELVY 100 MG TABS TAKE 1 TABLET BY MOUTH BY MOUTH AS NEEDED( MAY REPEAT 1 TABLET AFTER 2 HOURS IF NEEDED, MAXIMUM 2 TABLETS IN 24 HOURS) (Patient taking differently: Take 1 tablet by mouth as directed. Take 1 tab po as needed, may repeat 1 tab after 2 hour as needed max 2 tabs in 24 hours)   No facility-administered encounter medications on file as of 07/18/2021.   Reviewed chart for medication changes ahead of medication coordination call.  No OVs, Consults, or hospital visits since last care coordination call/Pharmacist visit. (If appropriate, list visit date, provider name)  No medication changes indicated OR if recent visit, treatment plan here.  BP Readings from Last 3 Encounters:  06/26/21 100/64  06/26/21 94/64  05/22/21 132/78    No results found for: HGBA1C   Patient obtains medications through Vials  90 Days   Last adherence delivery included:  Celebrex 200 mg Entresto 24-26 mg Rosuvastatin 20 mg Esomeprazole 20 mg Breo 100-25 mcg/inh Sertraline 100 mg Aimovig 140 mg/ ml Furosemide 40 mg  Patient declined (meds) last month due to abundance: Linzess Topiramate  Patient is due for next adherence delivery on: 07-30-2021  Called patient and reviewed medications and coordinated delivery.  This delivery to include: Breo ellipta 100-25 mg 1 puff daily Sertraline 100 mg half tablet daily Abilify 2 mg daily Megestrol 20 mg daily Lasix 40 mg 1 tablet every morning and half tablet in afternoon if needed Prilosec 20 mg daily Entresto 24-26 mg twice  daily Celebrex 200 mg daily Rosuvastatin 20 mg daily Diazepam 5 mg 3 times daily as needed  No short/acute fill needed  Patient declined the following medications: None  Patient needs refills for: Abilify 2 mg Megestrol 20 mg  Confirmed delivery date of 07-30-2021 advised patient that pharmacy will contact them the morning of delivery.  Care Gaps: Covid vaccine overdue Hep C screening overdue Tdap overdue Shingrix overdue PNA vac overdue  Star Rating Drugs: Rosuvastatin 20 mg- Last filled 04-24-2021 [facility]  [place] Clinical Pharmacist Assistant [phone]

## 2021-07-18 NOTE — Telephone Encounter (Signed)
Compliant on meds, added Carvedilol 3.125mg  to [facility] meds list as well as Ranolazine.

## 2021-07-24 ENCOUNTER — Other Ambulatory Visit: Payer: Self-pay | Admitting: Legal Medicine

## 2021-07-24 DIAGNOSIS — E440 Moderate protein-calorie malnutrition: Secondary | ICD-10-CM

## 2021-07-24 DIAGNOSIS — F321 Major depressive disorder, single episode, moderate: Secondary | ICD-10-CM

## 2021-07-25 ENCOUNTER — Other Ambulatory Visit: Payer: Self-pay | Admitting: Legal Medicine

## 2021-07-25 DIAGNOSIS — I10 Essential (primary) hypertension: Secondary | ICD-10-CM

## 2021-07-25 DIAGNOSIS — E782 Mixed hyperlipidemia: Secondary | ICD-10-CM

## 2021-07-25 DIAGNOSIS — J9611 Chronic respiratory failure with hypoxia: Secondary | ICD-10-CM

## 2021-07-25 DIAGNOSIS — F321 Major depressive disorder, single episode, moderate: Secondary | ICD-10-CM

## 2021-07-31 ENCOUNTER — Ambulatory Visit (INDEPENDENT_AMBULATORY_CARE_PROVIDER_SITE_OTHER): Payer: 59 | Admitting: Gastroenterology

## 2021-07-31 ENCOUNTER — Encounter: Payer: Self-pay | Admitting: Gastroenterology

## 2021-07-31 VITALS — BP 90/50 | HR 80 | Ht 60.0 in | Wt 125.2 lb

## 2021-07-31 DIAGNOSIS — K632 Fistula of intestine: Secondary | ICD-10-CM

## 2021-07-31 DIAGNOSIS — K5909 Other constipation: Secondary | ICD-10-CM | POA: Insufficient documentation

## 2021-07-31 HISTORY — DX: Other constipation: K59.09

## 2021-07-31 MED ORDER — NALOXEGOL OXALATE 12.5 MG PO TABS: 12.5000 mg | ORAL_TABLET | Freq: Every day | ORAL | 5 refills | Status: DC

## 2021-07-31 MED ORDER — NA SULFATE-K SULFATE-MG SULF 17.5-3.13-1.6 GM/177ML PO SOLN: 1.0000 | Freq: Once | ORAL | 0 refills | Status: AC

## 2021-07-31 NOTE — Patient Instructions (Addendum)
Stop Linzess.   We have sent the following medications to your pharmacy for you to pick up at your convenience: Movantik 12.5 mg daily.  Janett Billow recommends that you complete a bowel purge (to clean out your bowels). Please do the following: Purchase a bottle of Miralax over the counter as well as a box of 5 mg dulcolax tablets. Take 4 dulcolax tablets. Wait 1 hour. You will then drink 6-8 capfuls of Miralax mixed in an adequate amount of water/juice/gatorade (you may choose which of these liquids to drink) over the next 2-3 hours. You should expect results within 1 to 6 hours after completing the bowel purge.  You have been scheduled for a colonoscopy. Please follow written instructions given to you at your visit today.  Please pick up your prep supplies at the pharmacy within the next 1-3 days. If you use inhalers (even only as needed), please bring them with you on the day of your procedure.  If you are age 48 or older, your body mass index should be between 23-30. Your Body mass index is 24.46 kg/m. If this is out of the aforementioned range listed, please consider follow up with your Primary Care Provider.  If you are age 28 or younger, your body mass index should be between 19-25. Your Body mass index is 24.46 kg/m. If this is out of the aformentioned range listed, please consider follow up with your Primary Care Provider.   ________________________________________________________  The [place] [facility] providers would like to encourage you to use [facility] to communicate with providers for non-urgent requests or questions.  Due to long hold times on the telephone, sending your provider a message by [facility] may be a faster and more efficient way to get a response.  Please allow 48 business hours for a response.  Please remember that this is for non-urgent requests.  _______________________________________________________

## 2021-07-31 NOTE — Progress Notes (Signed)
07/31/2021 Clover Feehan [place] [id number] 1956-05-04   HISTORY OF PRESENT ILLNESS: This is a 65 year old female who is new to our office, but previously was a patient of Dr. Steve Rattler when he practiced in [place].  She has multiple medical problems as listed below.  She has been referred here by Dr. Leighton Ruff in order to discuss colonoscopy.  She was seen by Dr. Marcello Moores back in July for findings of fistula on CT scan.  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  She does not recall ever being treated for diverticulitis.  She does have a history of several abdominal surgeries and had a small bowel obstruction that required laparotomy in 2011.  She has chronic constipation and is on Linzess 290 mcg daily, but says that it does not seem to be working.  It looks like per Dr. Steve Rattler notes that she has tried Amitiza in the past.  Her last colonoscopy was by him in February 2017 at which time she only had diverticulosis and hemorrhoids.  She reports some lower abdominal pain.  Says her constipation is really bad.  She reports some intermittent bright red rectal bleeding.   Past Medical History:  Diagnosis Date   Abdominal pain 10/17/2020   Abnormality of gait due to impairment of balance 11/22/2020   Acute diverticulitis 10/17/2020   Admission for long-term opiate analgesic use 10/24/2019   Arthritis of right hip 05/29/2016   Formatting of this note might be different from the original. Added automatically from request for surgery [id number]   BMI 26.0-26.9,adult 03/29/2020   Bronchial asthma    Cardiomyopathy ([place])    Overview:  Ejection fraction 45% in 2015 Ejection fraction 30 to 35% in November 2018   Chronic back pain    Chronic hypoxemic respiratory failure () 10/24/2019   Chronic narcotic use 03/23/2015   Chronic pain of both knees 12/21/2018   Added automatically  from request for surgery [id number]  Formatting of this note might be different from the original. 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[place]) 07/13/2020   Hypotension 05/17/2020   ICD (implantable cardiac defibrillator) in place 2012   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   Ileus following gastrointestinal surgery ([place]) 03/26/2015   Major depressive disorder, single episode, moderate ([place]) 10/24/2019   Malnutrition of moderate degree ([place]) 10/20/2020   Mesenteric ischemia ([place]) 10/16/2020   Migraine without aura with status migrainosus 10/24/2019   Mixed incontinence 10/20/2020   Myocardial infarction ([facility])    Nausea & vomiting    Nausea alone 04/19/2014   Obstructive chronic bronchitis with exacerbation ([place]) 10/25/2019   Other spondylosis with radiculopathy, lumbar region 10/24/2019   Persistent  vomiting 05/17/2020   Presence of left artificial hip joint 10/24/2019   PTSD (post-traumatic stress disorder)    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 03/23/2015 04/19/2014   Senile osteoporosis 10/24/2019   Sepsis ([place]) 10/17/2020   Small bowel obstruction ([place])    Thyroid disease     Past Surgical History:  Procedure Laterality Date   APPENDECTOMY     CARDIAC CATHETERIZATION     CARPAL TUNNEL RELEASE     CESAREAN SECTION     CHF s/p AICD   12/2010   CHOLECYSTECTOMY     COLONOSCOPY  10/16/2015   Mild colonic diverticulosis, predominantly in the left colon. Small internal hemrrhoids. Otherwise normal colonoscopy   CORONARY ANGIOPLASTY     ESOPHAGOGASTRODUODENOSCOPY  02/04/2014   Mild gastritis. Otherwise normal EGD   HAND SURGERY     ICD GENERATOR CHANGEOUT N/A 05/21/2019   Procedure: ICD GENERATOR CHANGEOUT;  Surgeon: Constance Haw, MD;  Location: [place] [facility];  Service: Cardiovascular;  Laterality: N/A;   ICD IMPLANT     Medtronic   intestinal blockage 2011     LAPAROSCOPIC ASSISTED VENTRAL HERNIA REPAIR N/A 03/23/2015   Procedure: LAPAROSCOPIC VENTRAL WALL HERNIA REPAIR;  Surgeon: Michael Boston, MD;  Location: [facility];  Service: General;  Laterality: N/A;  With MESH   LAPAROSCOPIC LYSIS OF ADHESIONS N/A 03/23/2015   Procedure: LAPAROSCOPIC LYSIS OF ADHESIONS;  Surgeon: Michael Boston, MD;  Location: [facility];  Service: General;  Laterality: N/A;   LSCS      x2   NASAL SEPTUM SURGERY     NECK SURGERY     fused   TONSILLECTOMY     ULNAR NERVE TRANSPOSITION  01/23/2012   Procedure: ULNAR NERVE DECOMPRESSION/TRANSPOSITION;  Surgeon: Ophelia Charter, MD;  Location: [facility];  Service: Neurosurgery;  Laterality: Left;  LEFT ulnar nerve decompression    reports that she quit smoking about 16 months ago. Her smoking use included cigarettes. She has a 15.00 pack-year smoking history. She has never used smokeless tobacco. She reports current alcohol use of about 2.0 standard drinks per week. She reports that she does not use drugs. family history includes Alcohol abuse in her father; Cancer in an other family member; Heart attack in her father, mother, and another family member; Heart failure in an other family member; Hypertension in her father and  mother. No Known Allergies    Outpatient Encounter Medications as of 07/31/2021  Medication Sig   AIMOVIG 140 MG/ML SOAJ Inject 140g into THE SKIN every 28 DAYS   ARIPiprazole (ABILIFY) 2 MG tablet TAKE ONE TABLET BY MOUTH ONCE DAILY   aspirin 81 MG tablet Take 81 mg by mouth daily.   BREO ELLIPTA 100-25 MCG/ACT AEPB INHALE 1 PUFF BY MOUTH INTO LUNGS DAILY in THE afternoon   Buprenorphine HCl (BELBUCA) 900 MCG FILM Place 900 mcg inside cheek every 12 (twelve) hours.   Buprenorphine HCl-Naloxone HCl 8-2 MG FILM Place 1 strip under the tongue 2 (two) times daily.   carvedilol (COREG) 3.125 MG tablet Take 1 tablet (3.125 mg total) by mouth 2 (two) times daily with a meal.   celecoxib (CELEBREX) 200 MG capsule TAKE ONE CAPSULE BY MOUTH EVERY MORNING   cyclobenzaprine (FLEXERIL) 10 MG tablet Take 10 mg by mouth 3 (three) times daily as needed for muscle spasms.   denosumab (PROLIA) 60 MG/ML SOSY injection Inject 60 mg into the skin every 6 (six) months.   diazepam (VALIUM) 5 MG  tablet Take 1 tablet (5 mg total) by mouth 3 (three) times daily as needed for anxiety.   diclofenac Sodium (VOLTAREN) 1 % GEL Apply 2 g topically 4 (four) times daily.   diphenoxylate-atropine (LOMOTIL) 2.5-0.025 MG tablet Take 1 tablet by mouth 4 (four) times daily as needed for diarrhea or loose stools.   ENTRESTO 24-26 MG TAKE ONE TABLET BY MOUTH TWICE DAILY   EPINEPHRINE 0.3 mg/0.3 mL IJ SOAJ injection Inject 0.3 mLs (0.3 mg total) into the muscle as needed for anaphylaxis.   esomeprazole (NEXIUM) 20 MG capsule TAKE ONE CAPSULE BY MOUTH ONCE DAILY (Patient taking differently: Take 20 mg by mouth daily.)   fluticasone (FLONASE) 50 MCG/ACT nasal spray Place 2 sprays into both nostrils daily as needed (congestion). (Patient taking differently: Place 2 sprays into both nostrils daily as needed for allergies (congestion).)   furosemide (LASIX) 40 MG tablet TAKE ONE TABLET BY MOUTH EVERY MORNING MAY take 0.5 tablet in THE  afternoon IF needed] (Patient taking differently: Take 20 mg by mouth daily as needed for edema or fluid.)   gabapentin (NEURONTIN) 800 MG tablet Take 1 tablet (800 mg total) by mouth 3 (three) times daily. Patient usually takes twice a day (Patient taking differently: Take 800 mg by mouth 2 (two) times daily.)   ipratropium (ATROVENT) 0.02 % nebulizer solution Take 2.5 mLs (0.5 mg total) by nebulization every 4 (four) hours as needed for wheezing or shortness of breath.   ipratropium-albuterol (DUONEB) 0.5-2.5 (3) MG/3ML SOLN Inhale 3 mLs into the lungs every 6 (six) hours as needed (sob).   levocetirizine (XYZAL) 5 MG tablet Take 5 mg by mouth daily as needed for allergies.   linaclotide (LINZESS) 290 MCG CAPS capsule Take 1 capsule (290 mcg total) by mouth daily before breakfast.   LINZESS 145 MCG CAPS capsule Take 1 capsule (145 mcg total) by mouth daily as needed (indigestion).   LORazepam (ATIVAN) 2 MG tablet Take 2 mg by mouth at bedtime as needed for anxiety.   megestrol (MEGACE) 20 MG tablet TAKE ONE TABLET BY MOUTH ONCE DAILY   Multiple Vitamin (MULTIVITAMIN WITH MINERALS) TABS tablet Take 1 tablet by mouth daily. (Patient taking differently: Take 1 tablet by mouth daily. Unknown strength)   naloxone (NARCAN) nasal spray 4 mg/0.1 mL Place 1 spray into the nose once.   nitroGLYCERIN (NITROSTAT) 0.4 MG SL tablet Place 1 tablet (0.4 mg total) under the tongue every 5 (five) minutes as needed for chest pain.   ondansetron (ZOFRAN) 4 MG tablet Take 1 tablet (4 mg total) by mouth every 8 (eight) hours as needed for nausea or vomiting.   ranolazine (RANEXA) 500 MG 12 hr tablet TAKE 1 TABLET(500 MG) BY MOUTH TWICE DAILY   rosuvastatin (CRESTOR) 20 MG tablet TAKE ONE TABLET BY MOUTH EVERY MORNING (Patient taking differently: Take 20 mg by mouth daily.)   sertraline (ZOLOFT) 50 MG tablet TAKE 1 TABLET BY MOUTH DAILY (Patient taking differently: Take 50 mg by mouth daily.)   tiZANidine (ZANAFLEX) 4 MG  tablet Take 4 mg by mouth at bedtime.   topiramate (TOPAMAX) 50 MG tablet Take 2 tablets (100 mg total) by mouth at bedtime.   UBRELVY 100 MG TABS TAKE 1 TABLET BY MOUTH BY MOUTH AS NEEDED( MAY REPEAT 1 TABLET AFTER 2 HOURS IF NEEDED, MAXIMUM 2 TABLETS IN 24 HOURS) (Patient taking differently: Take 1 tablet by mouth as directed. Take 1 tab po as needed, may repeat 1 tab after 2 hour as needed  max 2 tabs in 24 hours)   [DISCONTINUED] oxyCODONE (ROXICODONE) 15 MG immediate release tablet Take 15 mg by mouth 3 (three) times daily as needed.   No facility-administered encounter medications on file as of 07/31/2021.     REVIEW OF SYSTEMS  : All other systems reviewed and negative except where noted in the History of Present Illness.   PHYSICAL EXAM: BP (!) 90/50   Pulse 80   Ht 5' (1.524 m)   Wt 125 lb 4 oz (56.8 kg)   BMI 24.46 kg/m  General: Well developed white female in no acute distress Head: Normocephalic and atraumatic Eyes:  Sclerae anicteric, conjunctiva pink. Ears: Normal auditory acuity Lungs: Clear throughout to auscultation; no W/R/R. Heart: Regular rate and rhythm; no M/R/G. Abdomen: Soft, non-distended.  BS present.  Mild diffuse TTP. Rectal:  Will be done at the time of colonoscopy. Musculoskeletal: Symmetrical with no gross deformities  Skin: No lesions on visible extremities Extremities: No edema  Neurological: Alert oriented x 4, grossly non-focal Psychological:  Alert and cooperative. Normal mood and affect  ASSESSMENT AND PLAN: *Chronic constipation, likely in part medication induced:  Currently on Linzess 290 mcg daily without much relief.  We will discontinue that.  She was instructed on MiraLAX purge that she can do this evening and will start Movantik at 12.5 mg daily tomorrow and see how she does with that.  Prescription sent to pharmacy. 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  Was seen  by surgery back in July and they recommended she come here to have a colonoscopy.  Will plan for colonoscopy with Dr. Silverio Decamp (next available with hospital procedure) with two day bowel prep. *Nighttime oxygen use *CHF with 30 to 35% ejection fraction *History of bowel obstruction requiring laparotomy in 2011  **The risks, benefits, and alternatives to colonoscopy were discussed with the patient and she consents to proceed.   CC:  Lillard Anes,*

## 2021-08-02 ENCOUNTER — Ambulatory Visit (INDEPENDENT_AMBULATORY_CARE_PROVIDER_SITE_OTHER): Payer: 59 | Admitting: Legal Medicine

## 2021-08-02 ENCOUNTER — Encounter: Payer: Self-pay | Admitting: Legal Medicine

## 2021-08-02 ENCOUNTER — Other Ambulatory Visit: Payer: Self-pay

## 2021-08-02 VITALS — BP 90/60 | HR 79 | Temp 97.4°F | Resp 16 | Ht 60.0 in | Wt 123.0 lb

## 2021-08-02 DIAGNOSIS — N3090 Cystitis, unspecified without hematuria: Secondary | ICD-10-CM

## 2021-08-02 DIAGNOSIS — H6521 Chronic serous otitis media, right ear: Secondary | ICD-10-CM | POA: Insufficient documentation

## 2021-08-02 HISTORY — DX: Chronic serous otitis media, right ear: H65.21

## 2021-08-02 LAB — POCT URINALYSIS DIP (CLINITEK)
Bilirubin, UA: NEGATIVE
Glucose, UA: NEGATIVE mg/dL
Ketones, POC UA: NEGATIVE mg/dL
Nitrite, UA: NEGATIVE
POC PROTEIN,UA: 30 — AB
Spec Grav, UA: 1.015 (ref 1.010–1.025)
Urobilinogen, UA: 0.2 E.U./dL
pH, UA: 7 (ref 5.0–8.0)

## 2021-08-02 MED ORDER — PREDNISONE 10 MG (21) PO TBPK: ORAL_TABLET | ORAL | 0 refills | Status: DC

## 2021-08-02 MED ORDER — CIPROFLOXACIN HCL 500 MG PO TABS: 500.0000 mg | ORAL_TABLET | Freq: Two times a day (BID) | ORAL | 0 refills | Status: AC

## 2021-08-02 NOTE — Progress Notes (Signed)
Acute Office Visit  Subjective:    Patient ID: [name], female    DOB: June 08, 1956, 65 y.o.   MRN: [id number]  Chief Complaint  Patient presents with   Hearing Loss   Urinary Tract Infection    HPI: Patient is in today for left ear stuffed up for 2 days.  No fever or chills, she is having dysuria leukocytes 3+.  Past Medical History:  Diagnosis Date   Abdominal pain 10/17/2020   Abnormality of gait due to impairment of balance 11/22/2020   Acute diverticulitis 10/17/2020   Admission for long-term opiate analgesic use 10/24/2019   Arthritis of right hip 05/29/2016   Formatting of this note might be different from the original. Added automatically from request for surgery [id number]   BMI 26.0-26.9,adult 03/29/2020   Bronchial asthma    Cardiomyopathy ([place])    Overview:  Ejection fraction 45% in 2015 Ejection fraction 30 to 35% in November 2018   Chronic back pain    Chronic hypoxemic respiratory failure ([place]) 10/24/2019   Chronic narcotic use 03/23/2015   Chronic pain of both knees 12/21/2018   Added automatically from request for surgery [id number]  Formatting of this note might be different from the original. 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[place]) 07/13/2020   Hypotension 05/17/2020   ICD (implantable cardiac defibrillator) in place 2012   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   Ileus following gastrointestinal surgery ([place]) 03/26/2015   Major depressive disorder, single episode, moderate ([place]) 10/24/2019   Malnutrition of moderate degree ([place]) 10/20/2020   Mesenteric ischemia ([place]) 10/16/2020   Migraine without aura with status migrainosus 10/24/2019   Mixed incontinence 10/20/2020   Myocardial infarction ([facility])    Nausea & vomiting    Nausea alone 04/19/2014   Obstructive chronic bronchitis with exacerbation ([place]) 10/25/2019   Other spondylosis with radiculopathy, lumbar region 10/24/2019   Persistent vomiting 05/17/2020   Presence of left artificial hip joint 10/24/2019   PTSD (post-traumatic stress disorder)    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 03/23/2015 04/19/2014   Senile osteoporosis 10/24/2019   Sepsis ([place]) 10/17/2020   Small bowel obstruction ([place])    Thyroid disease     Past Surgical History:  Procedure Laterality Date   APPENDECTOMY     CARDIAC CATHETERIZATION     CARPAL TUNNEL RELEASE     CESAREAN SECTION     CHF s/p AICD   12/2010   CHOLECYSTECTOMY     COLONOSCOPY  10/16/2015   Mild colonic diverticulosis, predominantly in the left colon. Small internal hemrrhoids. Otherwise normal colonoscopy   CORONARY ANGIOPLASTY     ESOPHAGOGASTRODUODENOSCOPY  02/04/2014   Mild gastritis. Otherwise normal EGD   HAND SURGERY     ICD GENERATOR CHANGEOUT N/A 05/21/2019  Procedure: ICD GENERATOR CHANGEOUT;  Surgeon: Constance Haw, MD;  Location: [facility];  Service: Cardiovascular;  Laterality: N/A;   ICD IMPLANT     Medtronic   intestinal blockage 2011     LAPAROSCOPIC ASSISTED VENTRAL HERNIA REPAIR N/A 03/23/2015   Procedure: LAPAROSCOPIC VENTRAL WALL HERNIA REPAIR;  Surgeon: Michael Boston, MD;   Location: [facility];  Service: General;  Laterality: N/A;  With MESH   LAPAROSCOPIC LYSIS OF ADHESIONS N/A 03/23/2015   Procedure: LAPAROSCOPIC LYSIS OF ADHESIONS;  Surgeon: Michael Boston, MD;  Location: [facility];  Service: General;  Laterality: N/A;   LSCS      x2   NASAL SEPTUM SURGERY     NECK SURGERY     fused   TONSILLECTOMY     ULNAR NERVE TRANSPOSITION  01/23/2012   Procedure: ULNAR NERVE DECOMPRESSION/TRANSPOSITION;  Surgeon: Ophelia Charter, MD;  Location: [facility];  Service: Neurosurgery;  Laterality: Left;  LEFT ulnar nerve decompression    Family History  Problem Relation Age of Onset   Heart attack Other    Cancer Other    Heart failure Other    Hypertension Mother    Heart attack Mother    Alcohol abuse Father    Hypertension Father    Heart attack Father    Anesthesia problems Neg Hx    Hypotension Neg Hx    Malignant hyperthermia Neg Hx    Pseudochol deficiency Neg Hx     Social History   Socioeconomic History   Marital status: Single    Spouse name: Not on file   Number of children: 3   Years of education: Not on file   Highest education level: Not on file  Occupational History   Occupation: disabled  Tobacco Use   Smoking status: Former    Packs/day: 0.50    Years: 30.00    Pack years: 15.00    Types: Cigarettes    Quit date: 03/2020    Years since quitting: 1.3   Smokeless tobacco: Never  Vaping Use   Vaping Use: Never used  Substance and Sexual Activity   Alcohol use: Yes    Alcohol/week: 2.0 standard drinks    Types: 1 Cans of beer, 1 Standard drinks or equivalent per week    Comment: seldom   Drug use: No   Sexual activity: Not Currently  Other Topics Concern   Not on file  Social History Narrative   One level home with boyfriend   Caffeine - coffee 1-2 cups/day; Green tea 4-5 bottles a day   Exercise - some    Right handed      Social Determinants of Health   Financial Resource Strain: Low Risk    Difficulty of Paying Living  Expenses: Not hard at all  Food Insecurity: No Food Insecurity   Worried About Charity fundraiser in the Last Year: Never true   Ran Out of Food in the Last Year: Never true  Transportation Needs: Unmet Transportation Needs   Lack of Transportation (Medical): Not on file   Lack of Transportation (Non-Medical): Yes  Physical Activity: Inactive   Days of Exercise per Week: 0 days   Minutes of Exercise per Session: 0 min  Stress: Stress Concern Present   Feeling of Stress : Rather much  Social Connections: Moderately Integrated   Frequency of Communication with Friends and Family: More than three times a week   Frequency of Social Gatherings with Friends and Family: Never  Attends Religious Services: 1 to 4 times per year   Active Member of Clubs or Organizations: No   Attends Archivist Meetings: Never   Marital Status: Married  Human resources officer Violence: Unknown   Fear of Current or Ex-Partner: No   Emotionally Abused: No   Physically Abused: No   Sexually Abused: Not on file    Outpatient Medications Prior to Visit  Medication Sig Dispense Refill   AIMOVIG 140 MG/ML SOAJ Inject 140g into THE SKIN every 28 DAYS 1 mL 0   ARIPiprazole (ABILIFY) 2 MG tablet TAKE ONE TABLET BY MOUTH ONCE DAILY 30 tablet 3   aspirin 81 MG tablet Take 81 mg by mouth daily.     BREO ELLIPTA 100-25 MCG/ACT AEPB INHALE 1 PUFF BY MOUTH INTO LUNGS DAILY in THE afternoon 180 each 2   Buprenorphine HCl (BELBUCA) 900 MCG FILM Place 900 mcg inside cheek every 12 (twelve) hours.     Buprenorphine HCl-Naloxone HCl 8-2 MG FILM Place 1 strip under the tongue 2 (two) times daily.     carvedilol (COREG) 3.125 MG tablet Take 1 tablet (3.125 mg total) by mouth 2 (two) times daily with a meal. 180 tablet 2   celecoxib (CELEBREX) 200 MG capsule TAKE ONE CAPSULE BY MOUTH EVERY MORNING 90 capsule 2   cyclobenzaprine (FLEXERIL) 10 MG tablet Take 10 mg by mouth 3 (three) times daily as needed for muscle spasms.      denosumab (PROLIA) 60 MG/ML SOSY injection Inject 60 mg into the skin every 6 (six) months. 180 mL 2   diazepam (VALIUM) 5 MG tablet Take 1 tablet (5 mg total) by mouth 3 (three) times daily as needed for anxiety. 90 tablet 3   diclofenac Sodium (VOLTAREN) 1 % GEL Apply 2 g topically 4 (four) times daily. 50 g 4   diphenoxylate-atropine (LOMOTIL) 2.5-0.025 MG tablet Take 1 tablet by mouth 4 (four) times daily as needed for diarrhea or loose stools. 30 tablet 0   ENTRESTO 24-26 MG TAKE ONE TABLET BY MOUTH TWICE DAILY 180 tablet 2   EPINEPHRINE 0.3 mg/0.3 mL IJ SOAJ injection Inject 0.3 mLs (0.3 mg total) into the muscle as needed for anaphylaxis. 1 each 2   esomeprazole (NEXIUM) 20 MG capsule TAKE ONE CAPSULE BY MOUTH ONCE DAILY (Patient taking differently: Take 20 mg by mouth daily.) 90 capsule 2   fluticasone (FLONASE) 50 MCG/ACT nasal spray Place 2 sprays into both nostrils daily as needed (congestion). (Patient taking differently: Place 2 sprays into both nostrils daily as needed for allergies (congestion).) 18 mL 2   furosemide (LASIX) 40 MG tablet TAKE ONE TABLET BY MOUTH EVERY MORNING MAY take 0.5 tablet in THE afternoon IF needed] (Patient taking differently: Take 20 mg by mouth daily as needed for edema or fluid.) 90 tablet 2   gabapentin (NEURONTIN) 800 MG tablet Take 1 tablet (800 mg total) by mouth 3 (three) times daily. Patient usually takes twice a day (Patient taking differently: Take 800 mg by mouth 2 (two) times daily.) 270 tablet 2   ipratropium (ATROVENT) 0.02 % nebulizer solution Take 2.5 mLs (0.5 mg total) by nebulization every 4 (four) hours as needed for wheezing or shortness of breath. 150 mL 2   ipratropium-albuterol (DUONEB) 0.5-2.5 (3) MG/3ML SOLN Inhale 3 mLs into the lungs every 6 (six) hours as needed (sob). 270 mL 3   levocetirizine (XYZAL) 5 MG tablet Take 5 mg by mouth daily as needed for allergies.     linaclotide (  LINZESS) 290 MCG CAPS capsule Take 1 capsule (290 mcg  total) by mouth daily before breakfast. 90 capsule 2   LINZESS 145 MCG CAPS capsule Take 1 capsule (145 mcg total) by mouth daily as needed (indigestion). 90 capsule 2   LORazepam (ATIVAN) 2 MG tablet Take 2 mg by mouth at bedtime as needed for anxiety.     megestrol (MEGACE) 20 MG tablet TAKE ONE TABLET BY MOUTH ONCE DAILY 30 tablet 3   Multiple Vitamin (MULTIVITAMIN WITH MINERALS) TABS tablet Take 1 tablet by mouth daily. (Patient taking differently: Take 1 tablet by mouth daily. Unknown strength)     naloxegol oxalate (MOVANTIK) 12.5 MG TABS tablet Take 1 tablet (12.5 mg total) by mouth daily. 30 tablet 5   naloxone (NARCAN) nasal spray 4 mg/0.1 mL Place 1 spray into the nose once.     nitroGLYCERIN (NITROSTAT) 0.4 MG SL tablet Place 1 tablet (0.4 mg total) under the tongue every 5 (five) minutes as needed for chest pain. 25 tablet 6   ondansetron (ZOFRAN) 4 MG tablet Take 1 tablet (4 mg total) by mouth every 8 (eight) hours as needed for nausea or vomiting. 20 tablet 0   ranolazine (RANEXA) 500 MG 12 hr tablet TAKE 1 TABLET(500 MG) BY MOUTH TWICE DAILY 180 tablet 3   rosuvastatin (CRESTOR) 20 MG tablet TAKE ONE TABLET BY MOUTH EVERY MORNING (Patient taking differently: Take 20 mg by mouth daily.) 90 tablet 2   sertraline (ZOLOFT) 50 MG tablet TAKE 1 TABLET BY MOUTH DAILY (Patient taking differently: Take 50 mg by mouth daily.) 90 tablet 2   tiZANidine (ZANAFLEX) 4 MG tablet Take 4 mg by mouth at bedtime.     topiramate (TOPAMAX) 50 MG tablet Take 2 tablets (100 mg total) by mouth at bedtime. 240 tablet 2   UBRELVY 100 MG TABS TAKE 1 TABLET BY MOUTH BY MOUTH AS NEEDED( MAY REPEAT 1 TABLET AFTER 2 HOURS IF NEEDED, MAXIMUM 2 TABLETS IN 24 HOURS) (Patient taking differently: Take 1 tablet by mouth as directed. Take 1 tab po as needed, may repeat 1 tab after 2 hour as needed max 2 tabs in 24 hours) 16 tablet 3   No facility-administered medications prior to visit.    No Known Allergies  Review of  Systems  Constitutional:  Negative for chills, fatigue and fever.  HENT:  Positive for ear pain (right ear pain). Negative for congestion and sore throat.   Respiratory:  Positive for shortness of breath. Negative for cough.   Cardiovascular:  Negative for chest pain and palpitations.  Gastrointestinal:  Positive for abdominal pain and nausea. Negative for constipation, diarrhea and vomiting.  Endocrine: Negative for polydipsia, polyphagia and polyuria.  Genitourinary:  Positive for dysuria. Negative for difficulty urinating.  Musculoskeletal:  Negative for arthralgias, back pain and myalgias.  Skin:  Negative for rash.  Neurological:  Positive for headaches.  Psychiatric/Behavioral:  Negative for dysphoric mood. The patient is not nervous/anxious.       Objective:    Physical Exam Vitals reviewed.  Constitutional:      Appearance: Normal appearance.  HENT:     Left Ear: Tympanic membrane, ear canal and external ear normal.     Ears:     Comments: Air/fluid levels behindTM on right, poor mobility    Mouth/Throat:     Mouth: Mucous membranes are moist.     Pharynx: Oropharynx is clear.  Eyes:     Extraocular Movements: Extraocular movements intact.  Pupils: Pupils are equal, round, and reactive to light.  Cardiovascular:     Rate and Rhythm: Normal rate and regular rhythm.     Pulses: Normal pulses.     Heart sounds: Normal heart sounds. No murmur heard.   No gallop.  Pulmonary:     Effort: Pulmonary effort is normal. No respiratory distress.     Breath sounds: No wheezing.  Abdominal:     General: Abdomen is flat. Bowel sounds are normal. There is no distension.     Tenderness: There is no abdominal tenderness.  Neurological:     General: No focal deficit present.     Mental Status: She is alert and oriented to person, place, and time.    BP 90/60   Pulse 79   Temp (!) 97.4 F (36.3 C)   Resp 16   Ht 5' (1.524 m)   Wt 123 lb (55.8 kg)   SpO2 98%   BMI 24.02  kg/m  Wt Readings from Last 3 Encounters:  08/02/21 123 lb (55.8 kg)  07/31/21 125 lb 4 oz (56.8 kg)  06/26/21 120 lb (54.4 kg)    Health Maintenance Due  Topic Date Due   Hepatitis C Screening  Never done   TETANUS/TDAP  Never done   Zoster Vaccines- Shingrix (1 of 2) Never done   MAMMOGRAM  09/22/2020   Pneumonia Vaccine 102+ Years old (3 - PCV) 06/02/2021    There are no preventive care reminders to display for this patient.   Lab Results  Component Value Date   TSH 0.816 02/15/2021   Lab Results  Component Value Date   WBC 9.9 05/22/2021   HGB 11.6 05/22/2021   HCT 34.3 05/22/2021   MCV 91 05/22/2021   PLT 345 05/22/2021   Lab Results  Component Value Date   NA 138 06/26/2021   K 4.3 06/26/2021   CO2 21 06/26/2021   GLUCOSE 93 06/26/2021   BUN 21 06/26/2021   CREATININE 1.23 (H) 06/26/2021   BILITOT 0.2 05/22/2021   ALKPHOS 68 05/22/2021   AST 11 05/22/2021   ALT 5 05/22/2021   PROT 6.7 05/22/2021   ALBUMIN 4.1 05/22/2021   CALCIUM 10.0 06/26/2021   ANIONGAP 6 10/29/2020   EGFR 48 (L) 06/26/2021   Lab Results  Component Value Date   CHOL 171 05/22/2021   Lab Results  Component Value Date   HDL 63 05/22/2021   Lab Results  Component Value Date   LDLCALC 91 05/22/2021   Lab Results  Component Value Date   TRIG 93 05/22/2021   Lab Results  Component Value Date   CHOLHDL 2.7 05/22/2021   No results found for: HGBA1C     Assessment & Plan:   Problem List Items Addressed This Visit       Nervous and Auditory   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  An After Visit Summary was printed and given to the patient.  Reinaldo Meeker, MD [facility] [phone]

## 2021-08-07 ENCOUNTER — Other Ambulatory Visit: Payer: Self-pay | Admitting: Legal Medicine

## 2021-08-07 DIAGNOSIS — N3000 Acute cystitis without hematuria: Secondary | ICD-10-CM

## 2021-08-07 LAB — URINE CULTURE

## 2021-08-07 MED ORDER — SULFAMETHOXAZOLE-TRIMETHOPRIM 800-160 MG PO TABS: 1.0000 | ORAL_TABLET | Freq: Two times a day (BID) | ORAL | 0 refills | Status: DC

## 2021-08-07 NOTE — Progress Notes (Signed)
E. Coli resistant to cipro, I called in bactrim

## 2021-08-08 ENCOUNTER — Other Ambulatory Visit: Payer: Self-pay | Admitting: Legal Medicine

## 2021-08-08 ENCOUNTER — Telehealth: Payer: Self-pay

## 2021-08-08 MED ORDER — AZITHROMYCIN 250 MG PO TABS: ORAL_TABLET | ORAL | 0 refills | Status: AC

## 2021-08-08 NOTE — Telephone Encounter (Signed)
Patient aware.  [name], [place] 08/08/21 10:59 AM

## 2021-08-08 NOTE — Telephone Encounter (Signed)
Patient having continued ear pain. Feels it has not improved. Was put on prednisone 12/08. Requesting advice of what she should do due to continued pain.   Royce Macadamia, [place] 08/08/21 9:22 AM

## 2021-08-15 ENCOUNTER — Encounter: Payer: Self-pay | Admitting: Legal Medicine

## 2021-08-15 ENCOUNTER — Other Ambulatory Visit: Payer: Self-pay

## 2021-08-15 ENCOUNTER — Ambulatory Visit (INDEPENDENT_AMBULATORY_CARE_PROVIDER_SITE_OTHER): Payer: 59 | Admitting: Legal Medicine

## 2021-08-15 ENCOUNTER — Telehealth: Payer: Self-pay

## 2021-08-15 VITALS — BP 98/64 | HR 74 | Temp 100.0°F | Resp 15 | Ht 60.0 in | Wt 123.0 lb

## 2021-08-15 DIAGNOSIS — N3000 Acute cystitis without hematuria: Secondary | ICD-10-CM | POA: Diagnosis not present

## 2021-08-15 DIAGNOSIS — H73011 Bullous myringitis, right ear: Secondary | ICD-10-CM | POA: Diagnosis not present

## 2021-08-15 LAB — POCT URINALYSIS DIP (CLINITEK)
Bilirubin, UA: NEGATIVE
Blood, UA: NEGATIVE
Glucose, UA: NEGATIVE mg/dL
Ketones, POC UA: NEGATIVE mg/dL
Leukocytes, UA: NEGATIVE
Nitrite, UA: NEGATIVE
POC PROTEIN,UA: NEGATIVE
Spec Grav, UA: 1.02 (ref 1.010–1.025)
Urobilinogen, UA: 0.2 E.U./dL
pH, UA: 5 (ref 5.0–8.0)

## 2021-08-15 MED ORDER — NEOMYCIN-POLYMYXIN-HC 3.5-10000-1 OT SOLN: 3.0000 [drp] | Freq: Four times a day (QID) | OTIC | 0 refills | Status: DC

## 2021-08-15 MED ORDER — CLARITHROMYCIN 500 MG PO TABS: 500.0000 mg | ORAL_TABLET | Freq: Two times a day (BID) | ORAL | 0 refills | Status: DC

## 2021-08-15 NOTE — Telephone Encounter (Signed)
[facility] [place] (Key: [id number]) Aimovig 140MG /ML auto-injectors   Form [employer] Medicare Part D Electronic Prior Authorization Form (2017 NCPDP) Created 4 days ago Sent to Plan 4 minutes ago Plan Response less than a minute ago Submit Clinical Questions less than a minute ago Determination Clinical Questions Are Ready Fill out the questions below and click "Send to Plan." The plan requires answers to the clinical questions for this electronic prior authorization.

## 2021-08-15 NOTE — Progress Notes (Signed)
Acute Office Visit  Subjective:    Patient ID: [name], female    DOB: 1955-12-06, 65 y.o.   MRN: [id number]  Chief Complaint  Patient presents with   Ear Pain    HPI: Patient is in today for otalgia right ear.  No popping.  Low grade fever.  Unable to hear.  Past Medical History:  Diagnosis Date   Abdominal pain 10/17/2020   Abnormality of gait due to impairment of balance 11/22/2020   Acute diverticulitis 10/17/2020   Admission for long-term opiate analgesic use 10/24/2019   Arthritis of right hip 05/29/2016   Formatting of this note might be different from the original. Added automatically from request for surgery [id number]   BMI 26.0-26.9,adult 03/29/2020   Bronchial asthma    Cardiomyopathy ([place])    Overview:  Ejection fraction 45% in 2015 Ejection fraction 30 to 35% in November 2018   Chronic back pain    Chronic constipation 07/31/2021   Chronic hypoxemic respiratory failure ([place]) 10/24/2019   Chronic narcotic use 03/23/2015   Chronic pain of both knees 12/21/2018   Added automatically from request for surgery [id number]  Formatting of this note might be different from the original. Added automatically from request for surgery [id number]   Chronic systolic congestive heart failure, NYHA class 2 (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[place]) 07/13/2020    Hypotension 05/17/2020   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   Ileus following gastrointestinal surgery ([place]) 03/26/2015   Major depressive disorder, single episode, moderate ([place]) 10/24/2019   Malnutrition of moderate degree ([place]) 10/20/2020   Mesenteric ischemia ([place]) 10/16/2020   Migraine without aura with status migrainosus 10/24/2019   Mixed incontinence 10/20/2020   Myocardial infarction (H[facility])    Nausea & vomiting    Obstructive chronic bronchitis with exacerbation ([place]) 10/25/2019   Other spondylosis with radiculopathy, lumbar region 10/24/2019   Persistent vomiting 05/17/2020   Presence of left artificial hip joint 10/24/2019   PTSD (post-traumatic stress disorder)    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 03/23/2015 04/19/2014   Senile osteoporosis 10/24/2019   Sepsis ([place]) 10/17/2020   Serosanguineous chronic otitis media of right ear 08/02/2021   Small bowel obstruction (H[facility])    Thyroid disease    Upper respiratory tract infection due to COVID-19 virus 07/06/2021   Wellness examination 03/27/2021    Past Surgical History:  Procedure Laterality Date   APPENDECTOMY     CARDIAC CATHETERIZATION     CARPAL TUNNEL RELEASE     CESAREAN SECTION     CHF s/p AICD   12/2010   CHOLECYSTECTOMY     COLONOSCOPY  10/16/2015   Mild colonic diverticulosis, predominantly in the left colon. Small internal hemrrhoids. Otherwise normal colonoscopy   CORONARY ANGIOPLASTY     ESOPHAGOGASTRODUODENOSCOPY  02/04/2014   Mild gastritis. Otherwise normal EGD   HAND SURGERY     ICD GENERATOR CHANGEOUT N/A  05/21/2019   Procedure: ICD GENERATOR CHANGEOUT;  Surgeon: Constance Haw, MD;  Location: [facility];  Service: Cardiovascular;  Laterality: N/A;   ICD IMPLANT     Medtronic   intestinal blockage 2011     LAPAROSCOPIC ASSISTED VENTRAL HERNIA REPAIR N/A 03/23/2015   Procedure: LAPAROSCOPIC VENTRAL WALL HERNIA REPAIR;   Surgeon: Michael Boston, MD;  Location: [facility];  Service: General;  Laterality: N/A;  With MESH   LAPAROSCOPIC LYSIS OF ADHESIONS N/A 03/23/2015   Procedure: LAPAROSCOPIC LYSIS OF ADHESIONS;  Surgeon: Michael Boston, MD;  Location: [facility];  Service: General;  Laterality: N/A;   LSCS      x2   NASAL SEPTUM SURGERY     NECK SURGERY     fused   TONSILLECTOMY     ULNAR NERVE TRANSPOSITION  01/23/2012   Procedure: ULNAR NERVE DECOMPRESSION/TRANSPOSITION;  Surgeon: Ophelia Charter, MD;  Location: [facility];  Service: Neurosurgery;  Laterality: Left;  LEFT ulnar nerve decompression    Family History  Problem Relation Age of Onset   Heart attack Other    Cancer Other    Heart failure Other    Hypertension Mother    Heart attack Mother    Alcohol abuse Father    Hypertension Father    Heart attack Father    Anesthesia problems Neg Hx    Hypotension Neg Hx    Malignant hyperthermia Neg Hx    Pseudochol deficiency Neg Hx     Social History   Socioeconomic History   Marital status: Single    Spouse name: Not on file   Number of children: 3   Years of education: Not on file   Highest education level: Not on file  Occupational History   Occupation: disabled  Tobacco Use   Smoking status: Former    Packs/day: 0.50    Years: 30.00    Pack years: 15.00    Types: Cigarettes    Quit date: 03/2020    Years since quitting: 1.3   Smokeless tobacco: Never  Vaping Use   Vaping Use: Never used  Substance and Sexual Activity   Alcohol use: Yes    Alcohol/week: 2.0 standard drinks    Types: 1 Cans of beer, 1 Standard drinks or equivalent per week    Comment: seldom   Drug use: No   Sexual activity: Not Currently  Other Topics Concern   Not on file  Social History Narrative   One level home with boyfriend   Caffeine - coffee 1-2 cups/day; Green tea 4-5 bottles a day   Exercise - some    Right handed      Social Determinants of Health   Financial Resource Strain: Low Risk     Difficulty of Paying Living Expenses: Not hard at all  Food Insecurity: No Food Insecurity   Worried About Charity fundraiser in the Last Year: Never true   Ran Out of Food in the Last Year: Never true  Transportation Needs: Unmet Transportation Needs   Lack of Transportation (Medical): Not on file   Lack of Transportation (Non-Medical): Yes  Physical Activity: Inactive   Days of Exercise per Week: 0 days   Minutes of Exercise per Session: 0 min  Stress: Stress Concern Present   Feeling of Stress : Rather much  Social Connections: Moderately Integrated   Frequency of Communication with Friends and Family: More than three times a week   Frequency of Social Gatherings with Friends and  Family: Never   Attends Religious Services: 1 to 4 times per year   Active Member of Clubs or Organizations: No   Attends Archivist Meetings: Never   Marital Status: Married  Human resources officer Violence: Unknown   Fear of Current or Ex-Partner: No   Emotionally Abused: No   Physically Abused: No   Sexually Abused: Not on file    Outpatient Medications Prior to Visit  Medication Sig Dispense Refill   AIMOVIG 140 MG/ML SOAJ Inject 140g into THE SKIN every 28 DAYS 1 mL 0   ARIPiprazole (ABILIFY) 2 MG tablet TAKE ONE TABLET BY MOUTH ONCE DAILY 30 tablet 3   aspirin 81 MG tablet Take 81 mg by mouth daily.     BREO ELLIPTA 100-25 MCG/ACT AEPB INHALE 1 PUFF BY MOUTH INTO LUNGS DAILY in THE afternoon 180 each 2   Buprenorphine HCl (BELBUCA) 900 MCG FILM Place 900 mcg inside cheek every 12 (twelve) hours.     Buprenorphine HCl-Naloxone HCl 8-2 MG FILM Place 1 strip under the tongue 2 (two) times daily.     carvedilol (COREG) 3.125 MG tablet Take 1 tablet (3.125 mg total) by mouth 2 (two) times daily with a meal. 180 tablet 2   celecoxib (CELEBREX) 200 MG capsule TAKE ONE CAPSULE BY MOUTH EVERY MORNING 90 capsule 2   cyclobenzaprine (FLEXERIL) 10 MG tablet Take 10 mg by mouth 3 (three) times daily as  needed for muscle spasms.     denosumab (PROLIA) 60 MG/ML SOSY injection Inject 60 mg into the skin every 6 (six) months. 180 mL 2   diazepam (VALIUM) 5 MG tablet Take 1 tablet (5 mg total) by mouth 3 (three) times daily as needed for anxiety. 90 tablet 3   diclofenac Sodium (VOLTAREN) 1 % GEL Apply 2 g topically 4 (four) times daily. 50 g 4   diphenoxylate-atropine (LOMOTIL) 2.5-0.025 MG tablet Take 1 tablet by mouth 4 (four) times daily as needed for diarrhea or loose stools. 30 tablet 0   ENTRESTO 24-26 MG TAKE ONE TABLET BY MOUTH TWICE DAILY 180 tablet 2   EPINEPHRINE 0.3 mg/0.3 mL IJ SOAJ injection Inject 0.3 mLs (0.3 mg total) into the muscle as needed for anaphylaxis. 1 each 2   esomeprazole (NEXIUM) 20 MG capsule TAKE ONE CAPSULE BY MOUTH ONCE DAILY (Patient taking differently: Take 20 mg by mouth daily.) 90 capsule 2   fluticasone (FLONASE) 50 MCG/ACT nasal spray Place 2 sprays into both nostrils daily as needed (congestion). (Patient taking differently: Place 2 sprays into both nostrils daily as needed for allergies (congestion).) 18 mL 2   furosemide (LASIX) 40 MG tablet TAKE ONE TABLET BY MOUTH EVERY MORNING MAY take 0.5 tablet in THE afternoon IF needed] (Patient taking differently: Take 20 mg by mouth daily as needed for edema or fluid.) 90 tablet 2   gabapentin (NEURONTIN) 800 MG tablet Take 1 tablet (800 mg total) by mouth 3 (three) times daily. Patient usually takes twice a day (Patient taking differently: Take 800 mg by mouth 2 (two) times daily.) 270 tablet 2   ipratropium (ATROVENT) 0.02 % nebulizer solution Take 2.5 mLs (0.5 mg total) by nebulization every 4 (four) hours as needed for wheezing or shortness of breath. 150 mL 2   ipratropium-albuterol (DUONEB) 0.5-2.5 (3) MG/3ML SOLN Inhale 3 mLs into the lungs every 6 (six) hours as needed (sob). 270 mL 3   levocetirizine (XYZAL) 5 MG tablet Take 5 mg by mouth daily as needed for allergies.  linaclotide (LINZESS) 290 MCG CAPS  capsule Take 1 capsule (290 mcg total) by mouth daily before breakfast. 90 capsule 2   LINZESS 145 MCG CAPS capsule Take 1 capsule (145 mcg total) by mouth daily as needed (indigestion). 90 capsule 2   LORazepam (ATIVAN) 2 MG tablet Take 2 mg by mouth at bedtime as needed for anxiety.     megestrol (MEGACE) 20 MG tablet TAKE ONE TABLET BY MOUTH ONCE DAILY 30 tablet 3   Multiple Vitamin (MULTIVITAMIN WITH MINERALS) TABS tablet Take 1 tablet by mouth daily. (Patient taking differently: Take 1 tablet by mouth daily. Unknown strength)     naloxegol oxalate (MOVANTIK) 12.5 MG TABS tablet Take 1 tablet (12.5 mg total) by mouth daily. 30 tablet 5   naloxone (NARCAN) nasal spray 4 mg/0.1 mL Place 1 spray into the nose once.     nitroGLYCERIN (NITROSTAT) 0.4 MG SL tablet Place 1 tablet (0.4 mg total) under the tongue every 5 (five) minutes as needed for chest pain. 25 tablet 6   ondansetron (ZOFRAN) 4 MG tablet Take 1 tablet (4 mg total) by mouth every 8 (eight) hours as needed for nausea or vomiting. 20 tablet 0   ranolazine (RANEXA) 500 MG 12 hr tablet TAKE 1 TABLET(500 MG) BY MOUTH TWICE DAILY 180 tablet 3   rosuvastatin (CRESTOR) 20 MG tablet TAKE ONE TABLET BY MOUTH EVERY MORNING (Patient taking differently: Take 20 mg by mouth daily.) 90 tablet 2   sertraline (ZOLOFT) 50 MG tablet TAKE 1 TABLET BY MOUTH DAILY (Patient taking differently: Take 50 mg by mouth daily.) 90 tablet 2   tiZANidine (ZANAFLEX) 4 MG tablet Take 4 mg by mouth at bedtime.     topiramate (TOPAMAX) 50 MG tablet Take 2 tablets (100 mg total) by mouth at bedtime. 240 tablet 2   UBRELVY 100 MG TABS TAKE 1 TABLET BY MOUTH BY MOUTH AS NEEDED( MAY REPEAT 1 TABLET AFTER 2 HOURS IF NEEDED, MAXIMUM 2 TABLETS IN 24 HOURS) (Patient taking differently: Take 1 tablet by mouth as directed. Take 1 tab po as needed, may repeat 1 tab after 2 hour as needed max 2 tabs in 24 hours) 16 tablet 3   predniSONE (STERAPRED UNI-PAK 21 TAB) 10 MG (21) TBPK  tablet Take 6ills first day , then 5 pills day 2 and then cut down one pill day until gone 21 tablet 0   sulfamethoxazole-trimethoprim (BACTRIM DS) 800-160 MG tablet Take 1 tablet by mouth 2 (two) times daily. Stop cipro 20 tablet 0   No facility-administered medications prior to visit.    No Known Allergies  Review of Systems  Constitutional:  Negative for chills, fatigue and fever.  HENT:  Positive for ear pain. Negative for congestion and sore throat.   Respiratory:  Negative for cough and shortness of breath.   Cardiovascular:  Negative for chest pain and palpitations.  Gastrointestinal:  Negative for abdominal pain, constipation, diarrhea, nausea and vomiting.  Endocrine: Negative for polydipsia, polyphagia and polyuria.  Genitourinary:  Negative for difficulty urinating and dysuria.  Musculoskeletal:  Negative for arthralgias, back pain and myalgias.  Skin:  Negative for rash.  Neurological:  Negative for headaches.  Psychiatric/Behavioral:  Negative for dysphoric mood. The patient is not nervous/anxious.       Objective:    Physical Exam Vitals reviewed.  Constitutional:      Appearance: Normal appearance.  HENT:     Left Ear: Tympanic membrane normal.     Ears:  Comments: Right TM large [place] and tm retracted and immobile    Nose: Nose normal.     Mouth/Throat:     Mouth: Mucous membranes are moist.     Pharynx: Oropharynx is clear.  Eyes:     Conjunctiva/sclera: Conjunctivae normal.     Pupils: Pupils are equal, round, and reactive to light.  Pulmonary:     Effort: Pulmonary effort is normal. No respiratory distress.     Breath sounds: Normal breath sounds. No wheezing.  Abdominal:     General: Abdomen is flat. Bowel sounds are normal. There is no distension.     Palpations: Abdomen is soft.     Tenderness: There is no abdominal tenderness.  Skin:    General: Skin is warm.     Capillary Refill: Capillary refill takes less than 2 seconds.  Neurological:      General: No focal deficit present.     Mental Status: She is alert and oriented to person, place, and time.    BP 98/64    Pulse 74    Temp 100 F (37.8 C)    Resp 15    Ht 5' (1.524 m)    Wt 123 lb (55.8 kg)    BMI 24.02 kg/m  Wt Readings from Last 3 Encounters:  08/15/21 123 lb (55.8 kg)  08/02/21 123 lb (55.8 kg)  07/31/21 125 lb 4 oz (56.8 kg)    Health Maintenance Due  Topic Date Due   Hepatitis C Screening  Never done   TETANUS/TDAP  Never done   Zoster Vaccines- Shingrix (1 of 2) Never done   MAMMOGRAM  09/22/2020   Pneumonia Vaccine 74+ Years old (3 - PCV) 06/02/2021    There are no preventive care reminders to display for this patient.   Lab Results  Component Value Date   TSH 0.816 02/15/2021   Lab Results  Component Value Date   WBC 9.9 05/22/2021   HGB 11.6 05/22/2021   HCT 34.3 05/22/2021   MCV 91 05/22/2021   PLT 345 05/22/2021   Lab Results  Component Value Date   NA 138 06/26/2021   K 4.3 06/26/2021   CO2 21 06/26/2021   GLUCOSE 93 06/26/2021   BUN 21 06/26/2021   CREATININE 1.23 (H) 06/26/2021   BILITOT 0.2 05/22/2021   ALKPHOS 68 05/22/2021   AST 11 05/22/2021   ALT 5 05/22/2021   PROT 6.7 05/22/2021   ALBUMIN 4.1 05/22/2021   CALCIUM 10.0 06/26/2021   ANIONGAP 6 10/29/2020   EGFR 48 (L) 06/26/2021   Lab Results  Component Value Date   CHOL 171 05/22/2021   Lab Results  Component Value Date   HDL 63 05/22/2021   Lab Results  Component Value Date   LDLCALC 91 05/22/2021   Lab Results  Component Value Date   TRIG 93 05/22/2021   Lab Results  Component Value Date   CHOLHDL 2.7 05/22/2021   No results found for: HGBA1C     Assessment & Plan:   Problem List Items Addressed This Visit   None Visit Diagnoses     Bullous myringitis of right ear    -  Primary   Relevant Medications   clarithromycin (BIAXIN) 500 MG tablet   neomycin-polymyxin-hydrocortisone (CORTISPORIN) OTIC solution Treat bullous myringitis with  biaxin, hold statin for 10 days   Acute cystitis without hematuria       Relevant Orders   POCT URINALYSIS DIP (CLINITEK) (Completed) Urinalysis show resolution of UTI  Meds ordered this encounter  Medications   clarithromycin (BIAXIN) 500 MG tablet    Sig: Take 1 tablet (500 mg total) by mouth 2 (two) times daily.    Dispense:  20 tablet    Refill:  0   neomycin-polymyxin-hydrocortisone (CORTISPORIN) OTIC solution    Sig: Place 3 drops into the right ear 4 (four) times daily.    Dispense:  10 mL    Refill:  0    Orders Placed This Encounter  Procedures   POCT URINALYSIS DIP (CLINITEK)     Follow-up: No follow-ups on file.  An After Visit Summary was printed and given to the patient.  Reinaldo Meeker, MD [facility] [phone]

## 2021-08-16 ENCOUNTER — Other Ambulatory Visit: Payer: Self-pay | Admitting: Neurology

## 2021-08-16 ENCOUNTER — Ambulatory Visit: Payer: 59 | Admitting: Cardiology

## 2021-08-30 ENCOUNTER — Ambulatory Visit (INDEPENDENT_AMBULATORY_CARE_PROVIDER_SITE_OTHER): Payer: 59

## 2021-08-30 DIAGNOSIS — I420 Dilated cardiomyopathy: Secondary | ICD-10-CM | POA: Diagnosis not present

## 2021-08-30 LAB — CUP PACEART REMOTE DEVICE CHECK
Battery Remaining Longevity: 106 mo
Battery Voltage: 3.02 V
Brady Statistic AP VP Percent: 0.02 %
Brady Statistic AP VS Percent: 8.93 %
Brady Statistic AS VP Percent: 0.04 %
Brady Statistic AS VS Percent: 91.02 %
Brady Statistic RA Percent Paced: 8.78 %
Brady Statistic RV Percent Paced: 0.06 %
Date Time Interrogation Session: 20230105033326
HighPow Impedance: 57 Ohm
Implantable Lead Implant Date: 20120502
Implantable Lead Implant Date: 20120502
Implantable Lead Location: 753859
Implantable Lead Location: 753860
Implantable Lead Model: 4076
Implantable Lead Model: 7122
Implantable Pulse Generator Implant Date: 20200925
Lead Channel Impedance Value: 304 Ohm
Lead Channel Impedance Value: 475 Ohm
Lead Channel Impedance Value: 589 Ohm
Lead Channel Pacing Threshold Amplitude: 0.5 V
Lead Channel Pacing Threshold Amplitude: 1.125 V
Lead Channel Pacing Threshold Pulse Width: 0.4 ms
Lead Channel Pacing Threshold Pulse Width: 0.4 ms
Lead Channel Sensing Intrinsic Amplitude: 12 mV
Lead Channel Sensing Intrinsic Amplitude: 12 mV
Lead Channel Sensing Intrinsic Amplitude: 2.25 mV
Lead Channel Sensing Intrinsic Amplitude: 2.25 mV
Lead Channel Setting Pacing Amplitude: 1.5 V
Lead Channel Setting Pacing Amplitude: 2.25 V
Lead Channel Setting Pacing Pulse Width: 0.4 ms
Lead Channel Setting Sensing Sensitivity: 0.3 mV

## 2021-09-03 ENCOUNTER — Ambulatory Visit: Payer: 59 | Admitting: Neurology

## 2021-09-04 NOTE — Progress Notes (Signed)
Reviewed and agree with documentation and assessment and plan. K. Veena Jian Hodgman , MD   

## 2021-09-10 NOTE — Progress Notes (Signed)
Remote ICD transmission.   

## 2021-09-19 NOTE — Progress Notes (Deleted)
NEUROLOGY FOLLOW UP OFFICE NOTE  [name] [id number]  Assessment/Plan:   Chronic migraine without aura, without status migrainosus, not intractable     1.Migraine prevention:  Aimovig 140mg  every 28 days; topiramate 100mg  QHS 2.Migraine rescue:  Roselyn Meier 100mg  3.Limit use of pain relievers to no more than 2 days out of week to prevent risk of rebound or medication-overuse headache. 4. Keep headache diary 5. Follow up 6 months.   Subjective:  [name] is a 66 year old Caucasian woman with CHF with EF 30-35%, s/p AICD, COPD, chronic back pain, chronic knee pain, arthritis, HTN, hyperlipidemia and PTSD and history of MI and former smoker and history of childhood seizures who follows up for migraines and dizziness.   UPDATE: Over past year, migraines and dizzy spells became more frequent (daily and 3 times a week respectively).  We started Aimovig ***  Intensity:  severe Duration:  1 hour if takes Ubrelvy immediately.   Frequency:  Twice daily Frequency of abortive medication:sumatriptan daily Current NSAIDS:  ASA 81mg ; Celebrex Current analgesics:  oxycodone (for back pain - takes almost), Belbuca (for back pain) Current triptans:  none Current ergotamine:  none Current anti-emetic:  none Current muscle relaxants:  none Current anti-anxiolytic:  Valium Current sleep aide:  none Current Antihypertensive medications:  Coreg, Lasix Current Antidepressant medications:  sertraline Current Anticonvulsant medications:  Topiramate 100mg  at bedtime; gabapentin 800mg  twice daily Current anti-CGRP:  Aimovig 140mg , Ubrelvy 100mg  Current Vitamins/Herbal/Supplements:  none Current Antihistamines/Decongestants:  Flonase Other therapy:  none Hormone/birth control:  none   Caffeine:  1 cup of coffee daily Alcohol:  no Smoker: former Diet: 100 oz water daily.  No soda. Exercise: no Pain:  back pain, chronic   HISTORY: She has had headaches since around 2017 but gradually got  worse, particularly since November 2019.  They are severe bifrontal pounding/squeezing headache.  There is no aura.  They are associated with photophobia, phonophobia, blurred vision and dizziness.  They typically last 1 hour and occur 5 to 6 a day every day.  They are triggered by loud noise.  Sumatriptan helps relieve the intensity but does not abort it..   Since November 2019, she also has had episodes of dizziness outside of the dizziness as well, causing her to fall.  She reports sudden spinning sensation lasting a few seconds and resolves.  She says it is not positional.  She has associated nausea and blurred vision.  She says it occurs 2 to 3 times a day.     She had a CT of head without contrast on 11/02/18 which was personally reviewed and was unremarkable, demonstrating mild atrophy and mild chronic small vessel ischemic changes but no acute intracranial abnormality.   She is treated by pain management for chronic pain.  She has degenerative disc disease of the cervical spine with previous neck fusion.  She has history of total right hip replacement.  She has chronic low back pain with radicular symptoms.  She has bilateral knee pain.   Past NSAIDS:  none Past analgesics:  Tylenol (contraindicated due to CHF) Past abortive triptans: sumatriptan 25mg  Past abortive ergotamine:  none Past muscle relaxants:  none Past anti-emetic:  none Past antihypertensive medications:  lisinopril Past antidepressant medications:  none Past anticonvulsant medications:  none Past anti-CGRP:  none Past vitamins/Herbal/Supplements:  none Past antihistamines/decongestants:  none Other past therapies:  none  PAST MEDICAL HISTORY: Past Medical History:  Diagnosis Date   Abdominal pain 10/17/2020   Abnormality of  gait due to impairment of balance 11/22/2020   Acute diverticulitis 10/17/2020   Admission for long-term opiate analgesic use 10/24/2019   Arthritis of right hip 05/29/2016   Formatting of this  note might be different from the original. Added automatically from request for surgery [id number]   BMI 26.0-26.9,adult 03/29/2020   Bronchial asthma    Cardiomyopathy ([place])    Overview:  Ejection fraction 45% in 2015 Ejection fraction 30 to 35% in November 2018   Chronic back pain    Chronic constipation 07/31/2021   Chronic hypoxemic respiratory failure ([place]) 10/24/2019   Chronic narcotic use 03/23/2015   Chronic pain of both knees 12/21/2018   Added automatically from request for surgery [id number]  Formatting of this note might be different from the original. 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[place]) 07/13/2020   Hypotension 05/17/2020   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   Ileus following gastrointestinal surgery ([place]) 03/26/2015   Major depressive disorder, single episode, moderate ([place]) 10/24/2019   Malnutrition of moderate degree ([place]) 10/20/2020   Mesenteric ischemia ([place]) 10/16/2020   Migraine without aura with status migrainosus 10/24/2019   Mixed incontinence 10/20/2020   Myocardial infarction ([facility])    Nausea & vomiting    Obstructive chronic bronchitis with exacerbation ([place]) 10/25/2019   Other spondylosis with radiculopathy,  lumbar region 10/24/2019   Persistent vomiting 05/17/2020   Presence of left artificial hip joint 10/24/2019   PTSD (post-traumatic stress disorder)    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 03/23/2015 04/19/2014   Senile osteoporosis 10/24/2019   Sepsis ([place]) 10/17/2020   Serosanguineous chronic otitis media of right ear 08/02/2021   Small bowel obstruction ([facility])    Thyroid disease    Upper respiratory tract infection due to COVID-19 virus 07/06/2021   Wellness examination 03/27/2021    MEDICATIONS: Current Outpatient Medications on File Prior to Visit  Medication Sig Dispense Refill   AIMOVIG 140 MG/ML SOAJ inject 140mg  into THE SKIN every 28 DAYS 1 mL 0   ARIPiprazole (ABILIFY) 2 MG tablet TAKE ONE TABLET BY MOUTH ONCE DAILY 30 tablet 3   aspirin 81 MG tablet Take 81 mg by mouth daily.     BREO ELLIPTA 100-25 MCG/ACT AEPB INHALE 1 PUFF BY MOUTH INTO LUNGS DAILY in THE afternoon 180 each 2   Buprenorphine HCl (BELBUCA) 900 MCG FILM Place 900 mcg inside cheek every 12 (twelve) hours.     Buprenorphine HCl-Naloxone HCl 8-2 MG FILM Place 1 strip under the tongue 2 (two) times daily.     carvedilol (COREG) 3.125 MG tablet Take 1 tablet (3.125 mg total) by mouth 2 (two) times daily with a meal. 180 tablet 2   celecoxib (CELEBREX) 200 MG capsule TAKE ONE CAPSULE BY MOUTH EVERY MORNING 90 capsule 2   clarithromycin (BIAXIN) 500 MG  tablet Take 1 tablet (500 mg total) by mouth 2 (two) times daily. 20 tablet 0   cyclobenzaprine (FLEXERIL) 10 MG tablet Take 10 mg by mouth 3 (three) times daily as needed for muscle spasms.     denosumab (PROLIA) 60 MG/ML SOSY injection Inject 60 mg into the skin every 6 (six) months. 180 mL 2   diazepam (VALIUM) 5 MG tablet Take 1 tablet (5 mg total) by mouth 3 (three) times daily as needed for anxiety. 90 tablet 3   diclofenac Sodium (VOLTAREN) 1 % GEL Apply 2 g topically 4 (four) times daily. 50 g 4   diphenoxylate-atropine (LOMOTIL)  2.5-0.025 MG tablet Take 1 tablet by mouth 4 (four) times daily as needed for diarrhea or loose stools. 30 tablet 0   ENTRESTO 24-26 MG TAKE ONE TABLET BY MOUTH TWICE DAILY 180 tablet 2   EPINEPHRINE 0.3 mg/0.3 mL IJ SOAJ injection Inject 0.3 mLs (0.3 mg total) into the muscle as needed for anaphylaxis. 1 each 2   esomeprazole (NEXIUM) 20 MG capsule TAKE ONE CAPSULE BY MOUTH ONCE DAILY (Patient taking differently: Take 20 mg by mouth daily.) 90 capsule 2   fluticasone (FLONASE) 50 MCG/ACT nasal spray Place 2 sprays into both nostrils daily as needed (congestion). (Patient taking differently: Place 2 sprays into both nostrils daily as needed for allergies (congestion).) 18 mL 2   furosemide (LASIX) 40 MG tablet TAKE ONE TABLET BY MOUTH EVERY MORNING MAY take 0.5 tablet in THE afternoon IF needed] (Patient taking differently: Take 20 mg by mouth daily as needed for edema or fluid.) 90 tablet 2   gabapentin (NEURONTIN) 800 MG tablet Take 1 tablet (800 mg total) by mouth 3 (three) times daily. Patient usually takes twice a day (Patient taking differently: Take 800 mg by mouth 2 (two) times daily.) 270 tablet 2   ipratropium (ATROVENT) 0.02 % nebulizer solution Take 2.5 mLs (0.5 mg total) by nebulization every 4 (four) hours as needed for wheezing or shortness of breath. 150 mL 2   ipratropium-albuterol (DUONEB) 0.5-2.5 (3) MG/3ML SOLN Inhale 3 mLs into the lungs every 6 (six) hours as needed (sob). 270 mL 3   levocetirizine (XYZAL) 5 MG tablet Take 5 mg by mouth daily as needed for allergies.     linaclotide (LINZESS) 290 MCG CAPS capsule Take 1 capsule (290 mcg total) by mouth daily before breakfast. 90 capsule 2   LINZESS 145 MCG CAPS capsule Take 1 capsule (145 mcg total) by mouth daily as needed (indigestion). 90 capsule 2   LORazepam (ATIVAN) 2 MG tablet Take 2 mg by mouth at bedtime as needed for anxiety.     megestrol (MEGACE) 20 MG tablet TAKE ONE TABLET BY MOUTH ONCE DAILY 30 tablet 3   Multiple  Vitamin (MULTIVITAMIN WITH MINERALS) TABS tablet Take 1 tablet by mouth daily. (Patient taking differently: Take 1 tablet by mouth daily. Unknown strength)     naloxegol oxalate (MOVANTIK) 12.5 MG TABS tablet Take 1 tablet (12.5 mg total) by mouth daily. 30 tablet 5   naloxone (NARCAN) nasal spray 4 mg/0.1 mL Place 1 spray into the nose once.     neomycin-polymyxin-hydrocortisone (CORTISPORIN) OTIC solution Place 3 drops into the right ear 4 (four) times daily. 10 mL 0   nitroGLYCERIN (NITROSTAT) 0.4 MG SL tablet Place 1 tablet (0.4 mg total) under the tongue every 5 (five) minutes as needed for chest pain. 25 tablet 6   ondansetron (ZOFRAN) 4 MG tablet Take 1 tablet (4 mg total)  by mouth every 8 (eight) hours as needed for nausea or vomiting. 20 tablet 0   ranolazine (RANEXA) 500 MG 12 hr tablet TAKE 1 TABLET(500 MG) BY MOUTH TWICE DAILY 180 tablet 3   rosuvastatin (CRESTOR) 20 MG tablet TAKE ONE TABLET BY MOUTH EVERY MORNING (Patient taking differently: Take 20 mg by mouth daily.) 90 tablet 2   sertraline (ZOLOFT) 50 MG tablet TAKE 1 TABLET BY MOUTH DAILY (Patient taking differently: Take 50 mg by mouth daily.) 90 tablet 2   tiZANidine (ZANAFLEX) 4 MG tablet Take 4 mg by mouth at bedtime.     topiramate (TOPAMAX) 50 MG tablet Take 2 tablets (100 mg total) by mouth at bedtime. 240 tablet 2   UBRELVY 100 MG TABS TAKE 1 TABLET BY MOUTH BY MOUTH AS NEEDED( MAY REPEAT 1 TABLET AFTER 2 HOURS IF NEEDED, MAXIMUM 2 TABLETS IN 24 HOURS) (Patient taking differently: Take 1 tablet by mouth as directed. Take 1 tab po as needed, may repeat 1 tab after 2 hour as needed max 2 tabs in 24 hours) 16 tablet 3   No current facility-administered medications on file prior to visit.    ALLERGIES: No Known Allergies  FAMILY HISTORY: Family History  Problem Relation Age of Onset   Heart attack Other    Cancer Other    Heart failure Other    Hypertension Mother    Heart attack Mother    Alcohol abuse Father     Hypertension Father    Heart attack Father    Anesthesia problems Neg Hx    Hypotension Neg Hx    Malignant hyperthermia Neg Hx    Pseudochol deficiency Neg Hx       Objective:  *** General: No acute distress.  Patient appears well-groomed.   Head:  Normocephalic/atraumatic Eyes:  Fundi examined but not visualized Neck: supple, no paraspinal tenderness, full range of motion Heart:  Regular rate and rhythm Lungs:  Clear to auscultation bilaterally Back: No paraspinal tenderness Neurological Exam: alert and oriented to person, place, and time.  Speech fluent and not dysarthric, language intact.  CN II-XII intact. Bulk and tone normal, muscle strength 5/5 throughout.  Sensation to light touch intact.  Deep tendon reflexes 2+ throughout, toes downgoing.  Finger to nose testing intact.  Gait normal, Romberg negative.   Crystal Clines, DO  CC: Lillard Anes, MD

## 2021-09-19 NOTE — Progress Notes (Signed)
Attempted to obtain medical history via telephone, unable to reach at this time. I left a voicemail to return pre surgical testing department's phone call.  

## 2021-09-20 ENCOUNTER — Ambulatory Visit: Payer: 59 | Admitting: Neurology

## 2021-09-26 ENCOUNTER — Telehealth: Payer: Self-pay | Admitting: Gastroenterology

## 2021-09-26 ENCOUNTER — Inpatient Hospital Stay: Admission: RE | Admit: 2021-09-26 | Payer: 59 | Source: Ambulatory Visit

## 2021-09-26 NOTE — Telephone Encounter (Signed)
The pt returned call and has been advised to follow the instructions from this point forward. She does have the prep on hand and will begin tonight as per the instructions. She will let us know if she has any trouble seeing the instructions on My Chart.

## 2021-09-26 NOTE — Telephone Encounter (Signed)
I tried to reach the pt by phone. No answer.  She was given the information at her office visit in December.  I will also send her a My Chart message.  FYI in case she calls you back since she was scheduled at the office visit.

## 2021-09-26 NOTE — Anesthesia Preprocedure Evaluation (Addendum)
Anesthesia Evaluation  Patient identified by MRN, date of birth, ID band Patient awake    Reviewed: Allergy & Precautions, NPO status , Patient's Chart, lab work & pertinent test results, reviewed documented beta blocker date and time   History of Anesthesia Complications Negative for: history of anesthetic complications  Airway Mallampati: II  TM Distance: >3 FB Neck ROM: Full    Dental  (+) Partial Upper   Pulmonary COPD,  COPD inhaler, former smoker,    Pulmonary exam normal        Cardiovascular hypertension, Pt. on medications and Pt. on home beta blockers + CAD, + Past MI and +CHF  Normal cardiovascular exam  TTE  10/2020: EF 30-35%, global hypokinesis, moderate LVH, grade I DD, RV systolic function mildly reduced, mild to moderate MR    Neuro/Psych  Headaches, Anxiety Depression Chronic back pain on narcotics    GI/Hepatic Neg liver ROS, GERD  Controlled and Medicated,colo-colonic fistula, chronic constipation   Endo/Other  negative endocrine ROS  Renal/GU negative Renal ROS  negative genitourinary   Musculoskeletal  (+) Arthritis ,   Abdominal   Peds  Hematology negative hematology ROS (+)   Anesthesia Other Findings Day of surgery medications reviewed with patient.  Reproductive/Obstetrics negative OB ROS                            Anesthesia Physical Anesthesia Plan  ASA: 4  Anesthesia Plan: MAC   Post-op Pain Management: Minimal or no pain anticipated   Induction:   PONV Risk Score and Plan: 2 and Treatment may vary due to age or medical condition and Propofol infusion  Airway Management Planned: Natural Airway and Simple Face Mask  Additional Equipment: None  Intra-op Plan:   Post-operative Plan:   Informed Consent: I have reviewed the patients History and Physical, chart, labs and discussed the procedure including the risks, benefits and alternatives for the  proposed anesthesia with the patient or authorized representative who has indicated his/her understanding and acceptance.       Plan Discussed with: CRNA  Anesthesia Plan Comments:        Anesthesia Quick Evaluation

## 2021-09-26 NOTE — Progress Notes (Signed)
Received a call from this patient about her colonoscopy tomorrow, she unfortunately did not have the instructions of the prep and was unsure about details of starting it. I was able to pull her instructions the office sent in December and they had her starting prep actually yesterday (1/31). She was unaware of this and so I told her she needed to call Dr Jillyn Hidden office and let them know what she should do. Patient given their office number and understands she needs to call them.

## 2021-09-26 NOTE — Telephone Encounter (Signed)
Patient has a hospital procedure tomorrow.  She said she never got the instructions and has not started prep yet.  According to someone who called her from the hospital, she should have started it yesterday.  She wants to know if she can start it now and still be seen or if she would need to reschedule.  Please call patient and advise.  Thank you.

## 2021-09-27 ENCOUNTER — Ambulatory Visit (HOSPITAL_COMMUNITY): Payer: 59 | Admitting: Anesthesiology

## 2021-09-27 ENCOUNTER — Encounter (HOSPITAL_COMMUNITY)
Admission: RE | Disposition: A | Discharge status: Home / Self Care | Payer: Self-pay | Source: Home / Self Care | Attending: Gastroenterology

## 2021-09-27 ENCOUNTER — Encounter (HOSPITAL_COMMUNITY): Payer: Self-pay | Admitting: Gastroenterology

## 2021-09-27 ENCOUNTER — Other Ambulatory Visit: Payer: Self-pay

## 2021-09-27 ENCOUNTER — Ambulatory Visit (HOSPITAL_COMMUNITY)
Admission: RE | Admit: 2021-09-27 | Discharge: 2021-09-27 | Disposition: A | Discharge status: Home / Self Care | Payer: 59 | Attending: Gastroenterology | Admitting: Gastroenterology

## 2021-09-27 ENCOUNTER — Other Ambulatory Visit: Payer: Self-pay | Admitting: Neurology

## 2021-09-27 DIAGNOSIS — K648 Other hemorrhoids: Secondary | ICD-10-CM | POA: Diagnosis not present

## 2021-09-27 DIAGNOSIS — F418 Other specified anxiety disorders: Secondary | ICD-10-CM | POA: Diagnosis not present

## 2021-09-27 DIAGNOSIS — K5730 Diverticulosis of large intestine without perforation or abscess without bleeding: Secondary | ICD-10-CM | POA: Diagnosis not present

## 2021-09-27 DIAGNOSIS — I5022 Chronic systolic (congestive) heart failure: Secondary | ICD-10-CM | POA: Insufficient documentation

## 2021-09-27 DIAGNOSIS — I252 Old myocardial infarction: Secondary | ICD-10-CM | POA: Diagnosis not present

## 2021-09-27 DIAGNOSIS — G8929 Other chronic pain: Secondary | ICD-10-CM | POA: Diagnosis not present

## 2021-09-27 DIAGNOSIS — Z87891 Personal history of nicotine dependence: Secondary | ICD-10-CM | POA: Diagnosis not present

## 2021-09-27 DIAGNOSIS — I2510 Atherosclerotic heart disease of native coronary artery without angina pectoris: Secondary | ICD-10-CM | POA: Insufficient documentation

## 2021-09-27 DIAGNOSIS — R933 Abnormal findings on diagnostic imaging of other parts of digestive tract: Secondary | ICD-10-CM

## 2021-09-27 DIAGNOSIS — R948 Abnormal results of function studies of other organs and systems: Secondary | ICD-10-CM | POA: Insufficient documentation

## 2021-09-27 DIAGNOSIS — J449 Chronic obstructive pulmonary disease, unspecified: Secondary | ICD-10-CM | POA: Diagnosis not present

## 2021-09-27 DIAGNOSIS — K219 Gastro-esophageal reflux disease without esophagitis: Secondary | ICD-10-CM | POA: Diagnosis not present

## 2021-09-27 DIAGNOSIS — I110 Hypertensive heart disease with heart failure: Secondary | ICD-10-CM | POA: Insufficient documentation

## 2021-09-27 DIAGNOSIS — M549 Dorsalgia, unspecified: Secondary | ICD-10-CM | POA: Diagnosis not present

## 2021-09-27 DIAGNOSIS — K5909 Other constipation: Secondary | ICD-10-CM

## 2021-09-27 DIAGNOSIS — K632 Fistula of intestine: Secondary | ICD-10-CM

## 2021-09-27 HISTORY — PX: COLONOSCOPY WITH PROPOFOL: SHX5780

## 2021-09-27 SURGERY — COLONOSCOPY WITH PROPOFOL
Anesthesia: Monitor Anesthesia Care

## 2021-09-27 MED ORDER — PHENYLEPHRINE 40 MCG/ML (10ML) SYRINGE FOR IV PUSH (FOR BLOOD PRESSURE SUPPORT)
PREFILLED_SYRINGE | INTRAVENOUS | Status: DC | PRN
  Administered 2021-09-27: 80 ug via INTRAVENOUS
  Administered 2021-09-27: 160 ug via INTRAVENOUS

## 2021-09-27 MED ORDER — LACTATED RINGERS IV SOLN: INTRAVENOUS | Status: DC

## 2021-09-27 MED ORDER — PROPOFOL 10 MG/ML IV BOLUS
INTRAVENOUS | Status: DC | PRN
  Administered 2021-09-27: 40 mg via INTRAVENOUS
  Administered 2021-09-27: 30 mg via INTRAVENOUS

## 2021-09-27 MED ORDER — PROPOFOL 500 MG/50ML IV EMUL
INTRAVENOUS | Status: DC | PRN
  Administered 2021-09-27: 120 ug/kg/min via INTRAVENOUS

## 2021-09-27 MED ORDER — SODIUM CHLORIDE 0.9 % IV SOLN: INTRAVENOUS | Status: DC

## 2021-09-27 SURGICAL SUPPLY — 22 items
ELECT REM PT RETURN 9FT ADLT (ELECTROSURGICAL)
ELECTRODE REM PT RTRN 9FT ADLT (ELECTROSURGICAL) IMPLANT
FCP BXJMBJMB 240X2.8X (CUTTING FORCEPS)
FLOOR PAD 36X40 (MISCELLANEOUS) ×2
FORCEPS BIOP RAD 4 LRG CAP 4 (CUTTING FORCEPS) IMPLANT
FORCEPS BIOP RJ4 240 W/NDL (CUTTING FORCEPS)
FORCEPS BXJMBJMB 240X2.8X (CUTTING FORCEPS) IMPLANT
INJECTOR/SNARE I SNARE (MISCELLANEOUS) IMPLANT
LUBRICANT JELLY 4.5OZ STERILE (MISCELLANEOUS) IMPLANT
MANIFOLD NEPTUNE II (INSTRUMENTS) IMPLANT
NDL SCLEROTHERAPY 25GX240 (NEEDLE) IMPLANT
NEEDLE SCLEROTHERAPY 25GX240 (NEEDLE) IMPLANT
PAD FLOOR 36X40 (MISCELLANEOUS) ×2 IMPLANT
PROBE APC STR FIRE (PROBE) IMPLANT
PROBE INJECTION GOLD (MISCELLANEOUS)
PROBE INJECTION GOLD 7FR (MISCELLANEOUS) IMPLANT
SNARE ROTATE MED OVAL 20MM (MISCELLANEOUS) IMPLANT
SYR 50ML LL SCALE MARK (SYRINGE) IMPLANT
TRAP SPECIMEN MUCOUS 40CC (MISCELLANEOUS) IMPLANT
TUBING ENDO SMARTCAP PENTAX (MISCELLANEOUS) IMPLANT
TUBING IRRIGATION ENDOGATOR (MISCELLANEOUS) ×3 IMPLANT
WATER STERILE IRR 1000ML POUR (IV SOLUTION) IMPLANT

## 2021-09-27 NOTE — Anesthesia Postprocedure Evaluation (Signed)
Anesthesia Post Note  Patient: [name]  Procedure(s) Performed: COLONOSCOPY WITH PROPOFOL     Patient location during evaluation: PACU Anesthesia Type: MAC Level of consciousness: awake and alert Pain management: pain level controlled Vital Signs Assessment: post-procedure vital signs reviewed and stable Respiratory status: spontaneous breathing, nonlabored ventilation and respiratory function stable Cardiovascular status: blood pressure returned to baseline Postop Assessment: no apparent nausea or vomiting Anesthetic complications: no   No notable events documented.  Last Vitals:  Vitals:   09/27/21 0944 09/27/21 0954  BP: (!) 88/54 100/62  Pulse: 88 82  Resp: (!) 22 12  Temp:    SpO2: 99% 98%    Last Pain:  Vitals:   09/27/21 0954  TempSrc:   PainSc: 0-No pain                 Marthenia Rolling

## 2021-09-27 NOTE — Discharge Instructions (Signed)
YOU HAD AN ENDOSCOPIC PROCEDURE TODAY: Refer to the procedure report and other information in the discharge instructions given to you for any specific questions about what was found during the examination. If this information does not answer your questions, please call [facility] office at [phone] to clarify.  ° °YOU SHOULD EXPECT: Some feelings of bloating in the abdomen. Passage of more gas than usual. Walking can help get rid of the air that was put into your GI tract during the procedure and reduce the bloating. If you had a lower endoscopy (such as a colonoscopy or flexible sigmoidoscopy) you may notice spotting of blood in your stool or on the toilet paper. Some abdominal soreness may be present for a day or two, also. ° °DIET: Your first meal following the procedure should be a light meal and then it is ok to progress to your normal diet. A half-sandwich or bowl of soup is an example of a good first meal. Heavy or fried foods are harder to digest and may make you feel nauseous or bloated. Drink plenty of fluids but you should avoid alcoholic beverages for 24 hours. If you had a esophageal dilation, please see attached instructions for diet.   ° °ACTIVITY: Your care partner should take you home directly after the procedure. You should plan to take it easy, moving slowly for the rest of the day. You can resume normal activity the day after the procedure however YOU SHOULD NOT DRIVE, use power tools, machinery or perform tasks that involve climbing or major physical exertion for 24 hours (because of the sedation medicines used during the test).  ° °SYMPTOMS TO REPORT IMMEDIATELY: °A gastroenterologist can be reached at any hour. Please call [phone]  for any of the following symptoms:  °Following lower endoscopy (colonoscopy, flexible sigmoidoscopy) °Excessive amounts of blood in the stool  °Significant tenderness, worsening of abdominal pains  °Swelling of the abdomen that is new, acute  °Fever of 100° or  higher  °Following upper endoscopy (EGD, EUS, ERCP, esophageal dilation) °Vomiting of blood or coffee ground material  °New, significant abdominal pain  °New, significant chest pain or pain under the shoulder blades  °Painful or persistently difficult swallowing  °New shortness of breath  °Black, tarry-looking or red, bloody stools ° °FOLLOW UP:  °If any biopsies were taken you will be contacted by phone or by letter within the next 1-3 weeks. Call [phone]  if you have not heard about the biopsies in 3 weeks.  °Please also call with any specific questions about appointments or follow up tests. ° °

## 2021-09-27 NOTE — Transfer of Care (Signed)
Immediate Anesthesia Transfer of Care Note  Patient: Reham Slabaugh Mcfaul  Procedure(s) Performed: COLONOSCOPY WITH PROPOFOL  Patient Location: PACU and Endoscopy Unit  Anesthesia Type:MAC  Level of Consciousness: awake, alert  and oriented  Airway & Oxygen Therapy: Patient Spontanous Breathing and Patient connected to face mask  Post-op Assessment: Report given to RN and Post -op Vital signs reviewed and stable  Post vital signs: Reviewed and stable  Last Vitals:  Vitals Value Taken Time  BP 107/84 09/27/21 0934  Temp 36.8 C 09/27/21 0934  Pulse 88 09/27/21 0934  Resp 17 09/27/21 0935  SpO2 98 % 09/27/21 0934  Vitals shown include unvalidated device data.  Last Pain:  Vitals:   09/27/21 0934  TempSrc:   PainSc: Asleep         Complications: No notable events documented.

## 2021-09-27 NOTE — Op Note (Signed)
[facility] Patient Name: [name] Procedure Date: 09/27/2021 MRN: [id number] Attending MD: Mauri Pole , MD Date of Birth: 1956/07/07 CSN: [id number] Age: 66 Admit Type: Outpatient Procedure:                Colonoscopy Indications:              Abnormal CT of the GI tract Providers:                Mauri Pole, MD, Jeanella Cara, RN,                            Princess Bruins, RN, Tyna Jaksch Technician Referring MD:              Medicines:                Monitored Anesthesia Care Complications:            No immediate complications. Estimated Blood Loss:     Estimated blood loss was minimal. Procedure:                Pre-Anesthesia Assessment:                           - Prior to the procedure, a History and Physical                            was performed, and patient medications and                            allergies were reviewed. The patient's tolerance of                            previous anesthesia was also reviewed. The risks                            and benefits of the procedure and the sedation                            options and risks were discussed with the patient.                            All questions were answered, and informed consent                            was obtained. Prior Anticoagulants: The patient has                            taken no previous anticoagulant or antiplatelet                            agents. ASA Grade Assessment: III - A patient with                            severe systemic disease. After reviewing the risks  and benefits, the patient was deemed in                            satisfactory condition to undergo the procedure.                           After obtaining informed consent, the colonoscope                            was passed under direct vision. Throughout the                            procedure, the patient's blood pressure, pulse, and                             oxygen saturations were monitored continuously. The                            PCF-HQ190L ([id number]) Olympus colonoscope was                            introduced through the anus and advanced to the the                            cecum, identified by appendiceal orifice and                            ileocecal valve. The colonoscopy was performed                            without difficulty. The patient tolerated the                            procedure well. The quality of the bowel                            preparation was adequate. The ileocecal valve,                            appendiceal orifice, and rectum were photographed. Scope In: 9:01:54 AM Scope Out: 9:27:30 AM Scope Withdrawal Time: 0 hours 11 minutes 47 seconds  Total Procedure Duration: 0 hours 25 minutes 36 seconds  Findings:      The perianal and digital rectal examinations were normal.      Scattered small and large-mouthed diverticula were found in the sigmoid       colon, descending colon and distal transverse colon.      Non-bleeding internal hemorrhoids were found during retroflexion. The       hemorrhoids were medium-sized.      The exam was otherwise without abnormality. No idenitifiable mucosal       lesion or inflammation Impression:               - Moderate diverticulosis in the sigmoid colon, in  the descending colon and in the distal transverse                            colon.                           - Non-bleeding internal hemorrhoids.                           - The examination was otherwise normal.                           - No specimens collected. No idenitifiable mucosal                            lesion or inflammation Moderate Sedation:      Not Applicable - Patient had care per Anesthesia. Recommendation:           - Patient has a contact number available for                            emergencies. The signs and symptoms of potential                             delayed complications were discussed with the                            patient. Return to normal activities tomorrow.                            Written discharge instructions were provided to the                            patient.                           - Resume previous diet.                           - Continue present medications.                           - Repeat colonoscopy in 10 years for screening                            purposes.                           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 Procedure Code(s):        --- Professional ---                           [id number], Colonoscopy, flexible; diagnostic, including                            collection of specimen(s) by brushing or washing,  when performed (separate procedure) Diagnosis Code(s):        --- Professional ---                           K64.8, Other hemorrhoids                           K57.30, Diverticulosis of large intestine without                            perforation or abscess without bleeding                           R93.3, Abnormal findings on diagnostic imaging of                            other parts of digestive tract CPT copyright 2019 American Medical Association. All rights reserved. The codes documented in this report are preliminary and upon coder review may  be revised to meet current compliance requirements. Mauri Pole, MD 09/27/2021 9:47:44 AM This report has been signed electronically. Number of Addenda: 0

## 2021-09-27 NOTE — H&P (Signed)
[facility] History and Physical   Primary Care Physician:  Lillard Anes, MD   Reason for Procedure:   Colo-colic and enteric fistula, abnormal CT of GI   Plan:    Colonoscopy with possible interventions     HPI: [name] is a 66 y.o. 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 All questions answered. The patient agrees to proceed.    Past Medical History:  Diagnosis Date   Abdominal pain 10/17/2020   Abnormality of gait due to impairment of balance 11/22/2020   Acute diverticulitis 10/17/2020   Admission for long-term opiate analgesic use 10/24/2019   Arthritis of right hip 05/29/2016   Formatting of this note might be different from the original. Added automatically from request for surgery [id number]   BMI 26.0-26.9,adult 03/29/2020   Bronchial asthma    Cardiomyopathy ([place])    Overview:  Ejection fraction 45% in 2015 Ejection fraction 30 to 35% in November 2018   Chronic back pain    Chronic constipation 07/31/2021   Chronic hypoxemic respiratory failure ([place]) 10/24/2019   Chronic narcotic use 03/23/2015   Chronic pain of both knees 12/21/2018   Added automatically from request for surgery [id number]  Formatting of this note might be different from the original. 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[place]) 07/13/2020   Hypotension 05/17/2020   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   Ileus following gastrointestinal surgery ([place]) 03/26/2015   Major depressive disorder, single episode, moderate ([place]) 10/24/2019   Malnutrition of moderate degree ([place]) 10/20/2020   Mesenteric ischemia ([place]) 10/16/2020   Migraine without aura with status migrainosus 10/24/2019   Mixed incontinence 10/20/2020   Myocardial infarction ([facility])    Nausea & vomiting    Obstructive chronic bronchitis with exacerbation ([place]) 10/25/2019   Other spondylosis with radiculopathy, lumbar region 10/24/2019   Persistent vomiting 05/17/2020   Presence of left artificial hip joint 10/24/2019   PTSD (post-traumatic stress disorder)    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 03/23/2015 04/19/2014   Senile osteoporosis 10/24/2019   Sepsis ([place]) 10/17/2020   Serosanguineous chronic otitis media of right ear 08/02/2021   Small bowel obstruction ([place])    Thyroid disease    Upper respiratory tract infection due to COVID-19 virus 07/06/2021   Wellness examination 03/27/2021    Past Surgical History:  Procedure Laterality Date   APPENDECTOMY     CARDIAC CATHETERIZATION     CARPAL TUNNEL RELEASE     CESAREAN SECTION     CHF s/p AICD   12/2010   CHOLECYSTECTOMY     COLONOSCOPY  10/16/2015   Mild colonic diverticulosis, predominantly in the left colon. Small internal hemrrhoids. Otherwise normal colonoscopy   CORONARY ANGIOPLASTY     ESOPHAGOGASTRODUODENOSCOPY  02/04/2014   Mild gastritis. Otherwise normal EGD   HAND SURGERY     ICD GENERATOR CHANGEOUT  N/A 05/21/2019   Procedure: ICD GENERATOR CHANGEOUT;  Surgeon: Constance Haw, MD;  Location: [facility];  Service: Cardiovascular;  Laterality: N/A;   ICD IMPLANT     Medtronic   intestinal blockage 2011     LAPAROSCOPIC ASSISTED VENTRAL HERNIA REPAIR N/A 03/23/2015   Procedure: LAPAROSCOPIC VENTRAL WALL HERNIA REPAIR;  Surgeon: Michael Boston, MD;  Location: [facility];  Service: General;  Laterality: N/A;  With MESH   LAPAROSCOPIC LYSIS OF ADHESIONS N/A 03/23/2015   Procedure: LAPAROSCOPIC LYSIS OF ADHESIONS;  Surgeon: Michael Boston, MD;  Location: [facility];  Service: General;  Laterality: N/A;   LSCS      x2   NASAL SEPTUM SURGERY     NECK SURGERY     fused   TONSILLECTOMY     ULNAR NERVE TRANSPOSITION  01/23/2012   Procedure: ULNAR NERVE DECOMPRESSION/TRANSPOSITION;  Surgeon: Ophelia Charter, MD;  Location: [facility];  Service: Neurosurgery;  Laterality: Left;  LEFT ulnar nerve decompression    Prior to Admission medications   Medication Sig Start Date End Date Taking? Authorizing Provider  AIMOVIG 140 MG/ML SOAJ inject 140mg  into THE SKIN every 28 DAYS 08/16/21  Yes Jaffe, Adam R, DO  ARIPiprazole (ABILIFY) 2 MG tablet TAKE ONE TABLET BY MOUTH ONCE DAILY 07/24/21  Yes Lillard Anes, MD  aspirin 81 MG tablet Take 81 mg by mouth daily.   Yes [provider]  BREO ELLIPTA 100-25 MCG/ACT AEPB INHALE 1 PUFF BY MOUTH INTO LUNGS DAILY in THE afternoon Patient taking differently: 1 puff 2 (two) times daily. 07/25/21  Yes Lillard Anes, MD  Buprenorphine HCl (BELBUCA) 900 MCG FILM Place 900 mcg inside cheek every 12 (twelve) hours.   Yes [provider]  carvedilol (COREG) 3.125 MG tablet Take 1 tablet (3.125 mg total) by mouth 2 (two) times daily with a meal. 04/02/21  Yes Lillard Anes, MD  celecoxib (CELEBREX) 200 MG capsule TAKE ONE CAPSULE BY MOUTH EVERY MORNING 07/13/21  Yes Lillard Anes, MD  cyclobenzaprine (FLEXERIL) 10  MG tablet Take 10 mg by mouth 3 (three) times daily as needed for muscle spasms.   Yes [provider]  denosumab (PROLIA) 60 MG/ML SOSY injection Inject 60 mg into the skin every 6 (six) months. 02/14/20  Yes Lillard Anes, MD  diazepam (VALIUM) 5 MG tablet Take 1 tablet (5 mg total) by mouth 3 (three) times daily as needed for anxiety. 06/26/21  Yes Lillard Anes, MD  diclofenac Sodium (VOLTAREN) 1 % GEL Apply 2 g topically 4 (four) times daily. Patient taking differently: Apply 2 g topically daily as needed (Pain). 05/01/21  Yes Lillard Anes, MD  diphenoxylate-atropine (LOMOTIL) 2.5-0.025 MG tablet Take 1 tablet by mouth 4 (four) times daily as needed for diarrhea or loose stools. 05/19/20  Yes Lillard Anes, MD  ENTRESTO 24-26 MG TAKE ONE TABLET BY MOUTH TWICE DAILY 07/13/21  Yes Lillard Anes, MD  EPINEPHRINE 0.3 mg/0.3 mL IJ SOAJ injection Inject 0.3 mLs (0.3 mg total) into the muscle as needed for anaphylaxis. 04/27/20  Yes Lillard Anes, MD  esomeprazole ([place]) 20 MG capsule TAKE ONE CAPSULE BY MOUTH ONCE DAILY Patient taking  differently: Take 20 mg by mouth daily. 04/22/21  Yes Lillard Anes, MD  fluticasone [facility]) 50 MCG/ACT nasal spray Place 2 sprays into both nostrils daily as needed (congestion). Patient taking differently: Place 2 sprays into both nostrils daily as needed for allergies (congestion). 01/21/20  Yes Lillard Anes, MD  furosemide (LASIX) 40 MG tablet TAKE ONE TABLET BY MOUTH EVERY MORNING MAY take 0.5 tablet in THE afternoon IF needed] Patient taking differently: Take 20 mg by mouth 2 (two) times daily. 04/22/21  Yes Lillard Anes, MD  gabapentin (NEURONTIN) 800 MG tablet Take 1 tablet (800 mg total) by mouth 3 (three) times daily. Patient usually takes twice a day Patient taking differently: Take 800 mg by mouth 3 (three) times daily. 01/21/20  Yes Lillard Anes, MD   ipratropium-albuterol (DUONEB) 0.5-2.5 (3) MG/3ML SOLN Inhale 3 mLs into the lungs every 6 (six) hours as needed (sob). 03/26/21  Yes Lillard Anes, MD  levocetirizine (XYZAL) 5 MG tablet Take 5 mg by mouth daily as needed for allergies. 12/13/19  Yes [provider]  linaclotide Rolan Lipa) 290 MCG CAPS capsule Take 1 capsule (290 mcg total) by mouth daily before breakfast. 05/22/21  Yes Lillard Anes, MD  megestrol (MEGACE) 20 MG tablet TAKE ONE TABLET BY MOUTH ONCE DAILY 07/24/21  Yes Lillard Anes, MD  Multiple Vitamin (MULTIVITAMIN WITH MINERALS) TABS tablet Take 1 tablet by mouth daily. 10/30/20  Yes Eugenie Filler, MD  naloxegol oxalate (MOVANTIK) 12.5 MG TABS tablet Take 1 tablet (12.5 mg total) by mouth daily. 07/31/21  Yes Zehr, Laban Emperor, PA-C  naloxone [facility]) nasal spray 4 mg/0.1 mL Place 1 spray into the nose once. 01/12/21  Yes [provider]  nitroGLYCERIN (NITROSTAT) 0.4 MG SL tablet Place 1 tablet (0.4 mg total) under the tongue every 5 (five) minutes as needed for chest pain. 01/21/20  Yes Lillard Anes, MD  ondansetron (ZOFRAN) 4 MG tablet Take 1 tablet (4 mg total) by mouth every 8 (eight) hours as needed for nausea or vomiting. 05/17/20  Yes Lillard Anes, MD  oxyCODONE (ROXICODONE) 15 MG immediate release tablet Take 15 mg by mouth 3 (three) times daily as needed. 08/29/21  Yes [provider]  ranolazine (RANEXA) 500 MG 12 hr tablet TAKE 1 TABLET(500 MG) BY MOUTH TWICE DAILY 07/06/21  Yes Park Liter, MD  rosuvastatin (CRESTOR) 20 MG tablet TAKE ONE TABLET BY MOUTH EVERY MORNING Patient taking differently: Take 20 mg by mouth daily. 04/22/21  Yes Lillard Anes, MD  sertraline (ZOLOFT) 50 MG tablet TAKE 1 TABLET BY MOUTH DAILY Patient taking differently: Take 50 mg by mouth daily. 06/19/21  Yes Lillard Anes, MD  tiZANidine (ZANAFLEX) 4 MG tablet Take 4 mg by mouth at bedtime. 11/09/20  Yes  [provider]  topiramate (TOPAMAX) 50 MG tablet Take 2 tablets (100 mg total) by mouth at bedtime. 01/24/21  Yes Lillard Anes, MD  UBRELVY 100 MG TABS TAKE 1 TABLET BY MOUTH BY MOUTH AS NEEDED( MAY REPEAT 1 TABLET AFTER 2 HOURS IF NEEDED, MAXIMUM 2 TABLETS IN 24 HOURS) Patient taking differently: Take 1 tablet by mouth as directed. Take 1 tab po as needed, may repeat 1 tab after 2 hour as needed max 2 tabs in 24 hours 10/30/20  Yes Jaffe, Adam R, DO  neomycin-polymyxin-hydrocortisone (CORTISPORIN) OTIC solution Place 3 drops into the right ear 4 (four) times daily. Patient not taking: Reported on 09/25/2021 08/15/21  Lillard Anes, MD    Current Facility-Administered Medications  Medication Dose Route Frequency Provider Last Rate Last Admin   0.9 %  sodium chloride infusion   Intravenous Continuous Zehr, Jessica D, PA-C       lactated ringers infusion   Intravenous Continuous Mauri Pole, MD 10 mL/hr at 09/27/21 0753 New Bag at 09/27/21 0753    Allergies as of 07/31/2021   (No Known Allergies)    Family History  Problem Relation Age of Onset   Heart attack Other    Cancer Other    Heart failure Other    Hypertension Mother    Heart attack Mother    Alcohol abuse Father    Hypertension Father    Heart attack Father    Anesthesia problems Neg Hx    Hypotension Neg Hx    Malignant hyperthermia Neg Hx    Pseudochol deficiency Neg Hx     Social History   Socioeconomic History   Marital status: Single    Spouse name: Not on file   Number of children: 3   Years of education: Not on file   Highest education level: Not on file  Occupational History   Occupation: disabled  Tobacco Use   Smoking status: Former    Packs/day: 0.50    Years: 30.00    Pack years: 15.00    Types: Cigarettes    Quit date: 03/2020    Years since quitting: 1.5   Smokeless tobacco: Never  Vaping Use   Vaping Use: Never used  Substance and Sexual Activity    Alcohol use: Yes    Alcohol/week: 2.0 standard drinks    Types: 1 Cans of beer, 1 Standard drinks or equivalent per week    Comment: seldom   Drug use: No   Sexual activity: Not Currently  Other Topics Concern   Not on file  Social History Narrative   One level home with boyfriend   Caffeine - coffee 1-2 cups/day; Green tea 4-5 bottles a day   Exercise - some    Right handed      Social Determinants of Health   Financial Resource Strain: Low Risk    Difficulty of Paying Living Expenses: Not hard at all  Food Insecurity: No Food Insecurity   Worried About Charity fundraiser in the Last Year: Never true   Ran Out of Food in the Last Year: Never true  Transportation Needs: Unmet Transportation Needs   Lack of Transportation (Medical): Not on file   Lack of Transportation (Non-Medical): Yes  Physical Activity: Inactive   Days of Exercise per Week: 0 days   Minutes of Exercise per Session: 0 min  Stress: Stress Concern Present   Feeling of Stress : Rather much  Social Connections: Moderately Integrated   Frequency of Communication with Friends and Family: More than three times a week   Frequency of Social Gatherings with Friends and Family: Never   Attends Religious Services: 1 to 4 times per year   Active Member of [employer] or Organizations: No   Attends Archivist Meetings: Never   Marital Status: Married  Human resources officer Violence: Unknown   Fear of Current or Ex-Partner: No   Emotionally Abused: No   Physically Abused: No   Sexually Abused: Not on file    Review of Systems:  All other review of systems negative except as mentioned in the HPI.  Physical Exam: Vital signs in last 24 hours: Temp:  [98.5 F (  36.9 C)] 98.5 F (36.9 C) (02/02 0740) Pulse Rate:  [80] 80 (02/02 0740) Resp:  [11] 11 (02/02 0740) BP: (92)/(61) 92/61 (02/02 0740) SpO2:  [100 %] 100 % (02/02 0740) Weight:  [59 kg] 59 kg (02/02 0740)   General:   Alert, NAD Lungs:  Clear .    Heart:  Regular rate and rhythm Abdomen:  Soft, nontender and nondistended. Neuro/Psych:  Alert and cooperative. Normal mood and affect. A and O x 3   K. Denzil Magnuson , MD [phone]

## 2021-09-28 ENCOUNTER — Encounter (HOSPITAL_COMMUNITY): Payer: Self-pay | Admitting: Gastroenterology

## 2021-10-02 ENCOUNTER — Ambulatory Visit (INDEPENDENT_AMBULATORY_CARE_PROVIDER_SITE_OTHER): Payer: 59 | Admitting: Legal Medicine

## 2021-10-02 ENCOUNTER — Other Ambulatory Visit: Payer: Self-pay

## 2021-10-02 ENCOUNTER — Encounter: Payer: Self-pay | Admitting: Legal Medicine

## 2021-10-02 VITALS — BP 108/62 | HR 91 | Temp 98.5°F | Ht 60.0 in | Wt 133.0 lb

## 2021-10-02 DIAGNOSIS — Z95810 Presence of automatic (implantable) cardiac defibrillator: Secondary | ICD-10-CM

## 2021-10-02 DIAGNOSIS — E785 Hyperlipidemia, unspecified: Secondary | ICD-10-CM

## 2021-10-02 DIAGNOSIS — I2510 Atherosclerotic heart disease of native coronary artery without angina pectoris: Secondary | ICD-10-CM

## 2021-10-02 DIAGNOSIS — J9611 Chronic respiratory failure with hypoxia: Secondary | ICD-10-CM

## 2021-10-02 DIAGNOSIS — J449 Chronic obstructive pulmonary disease, unspecified: Secondary | ICD-10-CM | POA: Diagnosis not present

## 2021-10-02 DIAGNOSIS — F1190 Opioid use, unspecified, uncomplicated: Secondary | ICD-10-CM | POA: Diagnosis not present

## 2021-10-02 DIAGNOSIS — I739 Peripheral vascular disease, unspecified: Secondary | ICD-10-CM

## 2021-10-02 DIAGNOSIS — K219 Gastro-esophageal reflux disease without esophagitis: Secondary | ICD-10-CM | POA: Diagnosis not present

## 2021-10-02 DIAGNOSIS — E2740 Unspecified adrenocortical insufficiency: Secondary | ICD-10-CM

## 2021-10-02 DIAGNOSIS — E440 Moderate protein-calorie malnutrition: Secondary | ICD-10-CM

## 2021-10-02 DIAGNOSIS — I5022 Chronic systolic (congestive) heart failure: Secondary | ICD-10-CM | POA: Diagnosis not present

## 2021-10-02 DIAGNOSIS — K632 Fistula of intestine: Secondary | ICD-10-CM

## 2021-10-02 DIAGNOSIS — F321 Major depressive disorder, single episode, moderate: Secondary | ICD-10-CM

## 2021-10-02 NOTE — Progress Notes (Signed)
Subjective:  Patient ID: [name], female    DOB: Mar 02, 1956  Age: 66 y.o. MRN: [id number]  Chief Complaint  Patient presents with   Hyperlipidemia    HPI: chronic visit patient has a complex.  She had substernal chest pain about 1 month ago which took 2 nitroglycerin to relieve she is to see Dr. Agustin Cree but it is in April.  We will try to get her an earlier appointment.  COPD:Patient is using duoneb 0.5-2.5 every 6 hours as needed, breo ellipta 100-25 mcg 1 puff 2 times daily. Patient has COPD but is stable she is on chronic oxygen at night 2 L/min from her chronic respiratory failure.  Constipation: She takes Movantik 12.5 mg daily.  Patient is on chronic pain medicines.  CONGESTIVE HEART FAILURE: Carvedilol 3.125 mg twice daily, Entresto 24-26 mg daily, furosemide 40 mg daily.  Patient has had congestive heart failure for the last several years she has no edema orthopnea or PND at the present time.  We will draw a BNP.  GERD: Esomeprazole 20 mg daily.  Patient has chronic GERD and remains on Nexium.  Migraines: She is using Aimovig 140 mg every 28 days, Ubrelvy 100 mg tablet as needed, topamax 100 mg at bedtime.  Patient has a long history of migraines requiring multiple prophylaxis medicines and Ubrelvy for acute onset of headache.  Hyperlipidemia: Takes Rosuvastatin 20 mg daily.  Patient has chronic hyperlipidemia and angina and is presently being controlled with Crestor.  Depression: Sertraline 50 mg daily, Abilify 2 mg daily.  Her depression is stable on sertraline and Abilify.  Chest pain: She mentioned had sharp pain on chest last month and she took 2 nitroglycerin and it helps after a little while. We will contacted cardiologist office to set up an appointment sooner.  Patient is accompanied by her nurse who drove her here.  She is new and therefore has nothing to add to the history.  Patient had a recent colonoscopy which showed diverticula and she will require  surgery for her colonic fistulas.     Current Outpatient Medications on File Prior to Visit  Medication Sig Dispense Refill   AIMOVIG 140 MG/ML SOAJ inject 140mg  into THE SKIN every 28 DAYS 1 mL 0   ARIPiprazole (ABILIFY) 2 MG tablet TAKE ONE TABLET BY MOUTH ONCE DAILY 30 tablet 3   aspirin 81 MG tablet Take 81 mg by mouth daily.     BREO ELLIPTA 100-25 MCG/ACT AEPB INHALE 1 PUFF BY MOUTH INTO LUNGS DAILY in THE afternoon (Patient taking differently: 1 puff 2 (two) times daily.) 180 each 2   Buprenorphine HCl (BELBUCA) 900 MCG FILM Place 900 mcg inside cheek every 12 (twelve) hours.     carvedilol (COREG) 3.125 MG tablet Take 1 tablet (3.125 mg total) by mouth 2 (two) times daily with a meal. 180 tablet 2   celecoxib (CELEBREX) 200 MG capsule TAKE ONE CAPSULE BY MOUTH EVERY MORNING 90 capsule 2   cyclobenzaprine (FLEXERIL) 10 MG tablet Take 10 mg by mouth 3 (three) times daily as needed for muscle spasms.     denosumab (PROLIA) 60 MG/ML SOSY injection Inject 60 mg into the skin every 6 (six) months. 180 mL 2   diazepam (VALIUM) 5 MG tablet Take 1 tablet (5 mg total) by mouth 3 (three) times daily as needed for anxiety. 90 tablet 3   diclofenac Sodium (VOLTAREN) 1 % GEL Apply 2 g topically 4 (four) times daily. (Patient taking differently: Apply 2 g  topically daily as needed (Pain).) 50 g 4   diphenoxylate-atropine (LOMOTIL) 2.5-0.025 MG tablet Take 1 tablet by mouth 4 (four) times daily as needed for diarrhea or loose stools. 30 tablet 0   ENTRESTO 24-26 MG TAKE ONE TABLET BY MOUTH TWICE DAILY 180 tablet 2   EPINEPHRINE 0.3 mg/0.3 mL IJ SOAJ injection Inject 0.3 mLs (0.3 mg total) into the muscle as needed for anaphylaxis. 1 each 2   esomeprazole (NEXIUM) 20 MG capsule TAKE ONE CAPSULE BY MOUTH ONCE DAILY (Patient taking differently: Take 20 mg by mouth daily.) 90 capsule 2   fluticasone (FLONASE) 50 MCG/ACT nasal spray Place 2 sprays into both nostrils daily as needed (congestion). (Patient  taking differently: Place 2 sprays into both nostrils daily as needed for allergies (congestion).) 18 mL 2   furosemide (LASIX) 40 MG tablet TAKE ONE TABLET BY MOUTH EVERY MORNING MAY take 0.5 tablet in THE afternoon IF needed] (Patient taking differently: Take 20 mg by mouth 2 (two) times daily.) 90 tablet 2   gabapentin (NEURONTIN) 800 MG tablet Take 1 tablet (800 mg total) by mouth 3 (three) times daily. Patient usually takes twice a day (Patient taking differently: Take 800 mg by mouth 3 (three) times daily.) 270 tablet 2   ipratropium-albuterol (DUONEB) 0.5-2.5 (3) MG/3ML SOLN Inhale 3 mLs into the lungs every 6 (six) hours as needed (sob). 270 mL 3   levocetirizine (XYZAL) 5 MG tablet Take 5 mg by mouth daily as needed for allergies.     megestrol (MEGACE) 20 MG tablet TAKE ONE TABLET BY MOUTH ONCE DAILY 30 tablet 3   Multiple Vitamin (MULTIVITAMIN WITH MINERALS) TABS tablet Take 1 tablet by mouth daily.     naloxegol oxalate (MOVANTIK) 12.5 MG TABS tablet Take 1 tablet (12.5 mg total) by mouth daily. 30 tablet 5   naloxone (NARCAN) nasal spray 4 mg/0.1 mL Place 1 spray into the nose once.     nitroGLYCERIN (NITROSTAT) 0.4 MG SL tablet Place 1 tablet (0.4 mg total) under the tongue every 5 (five) minutes as needed for chest pain. 25 tablet 6   ondansetron (ZOFRAN) 4 MG tablet Take 1 tablet (4 mg total) by mouth every 8 (eight) hours as needed for nausea or vomiting. 20 tablet 0   oxyCODONE (ROXICODONE) 15 MG immediate release tablet Take 15 mg by mouth 3 (three) times daily as needed.     ranolazine (RANEXA) 500 MG 12 hr tablet TAKE 1 TABLET(500 MG) BY MOUTH TWICE DAILY 180 tablet 3   rosuvastatin (CRESTOR) 20 MG tablet TAKE ONE TABLET BY MOUTH EVERY MORNING (Patient taking differently: Take 20 mg by mouth daily.) 90 tablet 2   sertraline (ZOLOFT) 50 MG tablet TAKE 1 TABLET BY MOUTH DAILY (Patient taking differently: Take 50 mg by mouth daily.) 90 tablet 2   tiZANidine (ZANAFLEX) 4 MG tablet  Take 4 mg by mouth at bedtime.     topiramate (TOPAMAX) 50 MG tablet Take 2 tablets (100 mg total) by mouth at bedtime. 240 tablet 2   UBRELVY 100 MG TABS TAKE 1 TABLET BY MOUTH BY MOUTH AS NEEDED( MAY REPEAT 1 TABLET AFTER 2 HOURS IF NEEDED, MAXIMUM 2 TABLETS IN 24 HOURS) (Patient taking differently: Take 1 tablet by mouth as directed. Take 1 tab po as needed, may repeat 1 tab after 2 hour as needed max 2 tabs in 24 hours) 16 tablet 3   No current facility-administered medications on file prior to visit.   Past Medical History:  Diagnosis  Date   Abdominal pain 10/17/2020   Abnormality of gait due to impairment of balance 11/22/2020   Acute diverticulitis 10/17/2020   Admission for long-term opiate analgesic use 10/24/2019   Arthritis of right hip 05/29/2016   Formatting of this note might be different from the original. Added automatically from request for surgery [id number]   BMI 26.0-26.9,adult 03/29/2020   Bronchial asthma    Cardiomyopathy ([place])    Overview:  Ejection fraction 45% in 2015 Ejection fraction 30 to 35% in November 2018   Chronic back pain    Chronic constipation 07/31/2021   Chronic hypoxemic respiratory failure ([place]) 10/24/2019   Chronic narcotic use 03/23/2015   Chronic pain of both knees 12/21/2018   Added automatically from request for surgery [id number]  Formatting of this note might be different from the original. 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   Ileus following gastrointestinal surgery ([place]) 03/26/2015   Major depressive disorder, single episode, moderate ([place]) 10/24/2019   Malnutrition of moderate degree ([place]) 10/20/2020   Mesenteric ischemia ([place]) 10/16/2020   Migraine without aura with status migrainosus 10/24/2019   Mixed incontinence 10/20/2020   Myocardial infarction (HCC)    Nausea & vomiting    Obstructive chronic bronchitis with exacerbation ([place]) 10/25/2019   Other spondylosis with radiculopathy, lumbar region 10/24/2019   Persistent vomiting 05/17/2020   Presence of left artificial hip joint 10/24/2019   PTSD (post-traumatic stress disorder)    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 03/23/2015 04/19/2014   Senile osteoporosis 10/24/2019   Sepsis ([place]) 10/17/2020   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 Small internal hemrrhoids. Otherwise normal colonoscopy   COLONOSCOPY WITH PROPOFOL N/A 09/27/2021   Procedure: COLONOSCOPY WITH PROPOFOL;  Surgeon: Mauri Pole, MD;  Location: [facility];  Service: Endoscopy;  Laterality: N/A;   CORONARY ANGIOPLASTY     ESOPHAGOGASTRODUODENOSCOPY  02/04/2014   Mild  gastritis. Otherwise normal EGD   HAND SURGERY     ICD GENERATOR CHANGEOUT N/A 05/21/2019   Procedure: ICD GENERATOR CHANGEOUT;  Surgeon: Constance Haw, MD;  Location: [facility];  Service: Cardiovascular;  Laterality: N/A;   ICD IMPLANT     [employer]   intestinal  blockage 2011     LAPAROSCOPIC ASSISTED VENTRAL HERNIA REPAIR N/A 03/23/2015   Procedure: LAPAROSCOPIC VENTRAL WALL HERNIA REPAIR;  Surgeon: Michael Boston, MD;  Location: [facility];  Service: General;  Laterality: N/A;  With MESH   LAPAROSCOPIC LYSIS OF ADHESIONS N/A 03/23/2015   Procedure: LAPAROSCOPIC LYSIS OF ADHESIONS;  Surgeon: Michael Boston, MD;  Location: [facility];  Service: General;  Laterality: N/A;   LSCS      x2   NASAL SEPTUM SURGERY     NECK SURGERY     fused   TONSILLECTOMY     ULNAR NERVE TRANSPOSITION  01/23/2012   Procedure: ULNAR NERVE DECOMPRESSION/TRANSPOSITION;  Surgeon: Ophelia Charter, MD;  Location: [facility];  Service: Neurosurgery;  Laterality: Left;  LEFT ulnar nerve decompression    Family History  Problem Relation Age of Onset   Heart attack Other    Cancer Other    Heart failure Other    Hypertension Mother    Heart attack Mother    Alcohol abuse Father    Hypertension Father    Heart attack Father    Anesthesia problems Neg Hx    Hypotension Neg Hx    Malignant hyperthermia Neg Hx    Pseudochol deficiency Neg Hx    Social History   Socioeconomic History   Marital status: Single    Spouse name: Not on file   Number of children: 3   Years of education: Not on file   Highest education level: Not on file  Occupational History   Occupation: disabled  Tobacco Use   Smoking status: Former    Packs/day: 0.50    Years: 30.00    Pack years: 15.00    Types: Cigarettes    Quit date: 03/2020    Years since quitting: 1.5   Smokeless tobacco: Never  Vaping Use   Vaping Use: Never used  Substance and Sexual Activity   Alcohol use: Yes    Alcohol/week: 2.0 standard drinks     Types: 1 Cans of beer, 1 Standard drinks or equivalent per week    Comment: seldom   Drug use: No   Sexual activity: Not Currently  Other Topics Concern   Not on file  Social History Narrative   One level home with boyfriend   Caffeine - coffee 1-2 cups/day; Green tea 4-5 bottles a day   Exercise - some    Right handed      Social Determinants of Health   Financial Resource Strain: Low Risk    Difficulty of Paying Living Expenses: Not hard at all  Food Insecurity: No Food Insecurity   Worried About Charity fundraiser in the Last Year: Never true   Ran Out of Food in the Last Year: Never true  Transportation Needs: Unmet Transportation Needs   Lack of Transportation (Medical): Not on file   Lack of Transportation (Non-Medical): Yes  Physical Activity: Inactive   Days of Exercise per Week: 0 days   Minutes of Exercise per Session: 0 min  Stress: Stress Concern Present   Feeling of Stress : Rather much  Social Connections: Moderately Integrated   Frequency of Communication with Friends and Family: More than three times a week   Frequency of Social Gatherings with Friends and Family: Never   Attends Religious Services: 1 to 4 times per year   Active Member of [employer] or Organizations: No   Attends Archivist Meetings: Never   Marital Status: Married    Review  of Systems  Constitutional:  Negative for activity change and appetite change.  HENT:  Negative for congestion.   Eyes:  Negative for visual disturbance.  Respiratory:  Positive for chest tightness and shortness of breath.   Cardiovascular:  Positive for chest pain. Negative for palpitations and leg swelling.  Gastrointestinal:  Negative for abdominal distention and abdominal pain.  Endocrine: Negative for polyuria.  Genitourinary:  Negative for difficulty urinating.  Musculoskeletal:  Positive for arthralgias and back pain.  Skin: Negative.   Neurological:  Positive for headaches.  Hematological:  Negative.   Psychiatric/Behavioral:  Positive for dysphoric mood.     Objective:  BP 108/62 (BP Location: Right Arm, Patient Position: Sitting)    Pulse 91    Temp 98.5 F (36.9 C) (Oral)    Ht 5' (1.524 m)    Wt 133 lb (60.3 kg)    SpO2 100%    BMI 25.97 kg/m   BP/Weight 10/02/2021 09/27/2021 47/04/6282  Systolic BP 662 947 98  Diastolic BP 62 62 64  Wt. (Lbs) 133 130 123  BMI 25.97 25.39 24.02    Physical Exam Vitals reviewed.  Constitutional:      Appearance: Normal appearance. She is obese.  HENT:     Head: Normocephalic.     Right Ear: Tympanic membrane, ear canal and external ear normal.     Left Ear: Tympanic membrane, ear canal and external ear normal.     Nose: Nose normal.     Mouth/Throat:     Mouth: Mucous membranes are moist.     Pharynx: Oropharynx is clear. No posterior oropharyngeal erythema.  Eyes:     Extraocular Movements: Extraocular movements intact.     Conjunctiva/sclera: Conjunctivae normal.     Pupils: Pupils are equal, round, and reactive to light.  Neck:     Vascular: No carotid bruit.  Cardiovascular:     Rate and Rhythm: Normal rate and regular rhythm.     Pulses: Normal pulses.     Heart sounds: Normal heart sounds. No murmur heard.   No gallop.  Pulmonary:     Effort: Pulmonary effort is normal. No respiratory distress.     Breath sounds: Normal breath sounds. No wheezing.  Abdominal:     General: Bowel sounds are normal. There is no distension.     Palpations: Abdomen is soft. There is no mass.     Tenderness: There is no abdominal tenderness.  Musculoskeletal:        General: Normal range of motion.     Cervical back: Normal range of motion. No tenderness.     Right lower leg: No edema.     Left lower leg: No edema.  Skin:    General: Skin is warm.     Capillary Refill: Capillary refill takes 2 to 3 seconds.  Neurological:     General: No focal deficit present.     Mental Status: She is alert and oriented to person, place, and time.  Mental status is at baseline.     Gait: Gait normal.     Deep Tendon Reflexes: Reflexes normal.  Psychiatric:        Mood and Affect: Mood normal.        Behavior: Behavior normal.        Thought Content: Thought content normal.        Judgment: Judgment normal.        Lab Results  Component Value Date   WBC 9.9 05/22/2021   HGB 11.6  05/22/2021   HCT 34.3 05/22/2021   PLT 345 05/22/2021   GLUCOSE 93 06/26/2021   CHOL 171 05/22/2021   TRIG 93 05/22/2021   HDL 63 05/22/2021   LDLCALC 91 05/22/2021   ALT 5 05/22/2021   AST 11 05/22/2021   NA 138 06/26/2021   K 4.3 06/26/2021   CL 103 06/26/2021   CREATININE 1.23 (H) 06/26/2021   BUN 21 06/26/2021   CO2 21 06/26/2021   TSH 0.816 02/15/2021   INR 1.0 10/18/2020      Assessment & Plan:   Problem List Items Addressed This Visit       Cardiovascular and Mediastinum   Chronic systolic congestive heart failure, NYHA class 2 (HCC)   Relevant Orders   Comprehensive metabolic panel   CBC with Differential/Platelet   Pro b natriuretic peptide Patient has a history of chronic congestive heart failure she is [place] class II she has had no further orthopnea PND no ankle swelling.    Coronary artery disease involving native coronary artery of native heart without angina pectoris Medical history coronary disease but recently did have severe substernal chest pain requiring 2 nitroglycerin to relieve we will refer to Dr. Agustin Cree for further work-up     Respiratory   COPD (chronic obstructive pulmonary disease) ([place]) - Primary An individualize plan was formulated for care of COPD.  Treatment is evidence based.  She will continue on inhalers, avoid smoking and smoke.  Regular exercise with help with dyspnea. Routine follow ups and medication compliance is needed.    Chronic hypoxemic respiratory failure (HCC) Patient's chronic respiratory failure and requires oxygen 2 L/min at night.     Digestive   GERD  (gastroesophageal reflux disease) Patient has chronic GERD and is on Nexium and doing well she does poorly when this is stopped.    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  The plan is to fix the fistulous soon.     Endocrine   Hypoaldosteronism [facility]) Patient has a history of hypoaldosteronism as part of her congestive failure     Other   Chronic narcotic use Patient is on chronic buprenorphine for chronic back pain she requires Movantik for constipation.    ICD (implantable cardioverter-defibrillator) in place Patient does have an ICD in place has not discharged since implanted.    Dyslipidemia   Relevant Orders   Lipid panel AN INDIVIDUAL CARE PLAN for hyperlipidemia/ cholesterol was established and reinforced today.  The patient's status was assessed using clinical findings on exam, lab and other diagnostic tests. The patient's disease status was assessed based on evidence-based guidelines and found to be fair controlled. MEDICATIONS were reviewed. SELF MANAGEMENT GOALS have been discussed and patient's success at attaining the goal of low cholesterol was assessed. RECOMMENDATION given include regular exercise 3 days a week and low cholesterol/low fat diet. CLINICAL SUMMARY including written plan to identify barriers unique to the patient due to social or economic  reasons was discussed.     Major depressive disorder, single episode, moderate (HCC)  patient's depression is stable      Malnutrition of moderate degree ([place]) Supplement nutrition with protein/calorie supplement with meals to improve nutritional status.    Other Visit Diagnoses     Claudication in peripheral vascular disease ([place])   (Chronic)   Patient has some peripheral vascular disease and gets claudication she is doing well on her present medicines.     . 30 minute visit and  review of GI records   Orders Placed This Encounter  Procedures   Comprehensive  metabolic panel   Lipid panel   CBC with Differential/Platelet   Pro b natriuretic peptide     Follow-up: Return in about 3 months (around 12/30/2021) for fasting.  An After Visit Summary was printed and given to the patient.  Reinaldo Meeker, MD [facility] [phone]

## 2021-10-02 NOTE — Progress Notes (Signed)
Subjective:  Patient ID: [name], female    DOB: 07-01-1956  Age: 66 y.o. MRN: [id number]  Chief Complaint  Patient presents with   Hyperlipidemia    Hyperlipidemia Pertinent negatives include no chest pain, myalgias or shortness of breath.    Current Outpatient Medications on File Prior to Visit  Medication Sig Dispense Refill   AIMOVIG 140 MG/ML SOAJ inject 140mg  into THE SKIN every 28 DAYS 1 mL 0   ARIPiprazole (ABILIFY) 2 MG tablet TAKE ONE TABLET BY MOUTH ONCE DAILY 30 tablet 3   aspirin 81 MG tablet Take 81 mg by mouth daily.     BREO ELLIPTA 100-25 MCG/ACT AEPB INHALE 1 PUFF BY MOUTH INTO LUNGS DAILY in THE afternoon (Patient taking differently: 1 puff 2 (two) times daily.) 180 each 2   Buprenorphine HCl (BELBUCA) 900 MCG FILM Place 900 mcg inside cheek every 12 (twelve) hours.     carvedilol (COREG) 3.125 MG tablet Take 1 tablet (3.125 mg total) by mouth 2 (two) times daily with a meal. 180 tablet 2   celecoxib (CELEBREX) 200 MG capsule TAKE ONE CAPSULE BY MOUTH EVERY MORNING 90 capsule 2   cyclobenzaprine (FLEXERIL) 10 MG tablet Take 10 mg by mouth 3 (three) times daily as needed for muscle spasms.     denosumab (PROLIA) 60 MG/ML SOSY injection Inject 60 mg into the skin every 6 (six) months. 180 mL 2   diazepam (VALIUM) 5 MG tablet Take 1 tablet (5 mg total) by mouth 3 (three) times daily as needed for anxiety. 90 tablet 3   diclofenac Sodium (VOLTAREN) 1 % GEL Apply 2 g topically 4 (four) times daily. (Patient taking differently: Apply 2 g topically daily as needed (Pain).) 50 g 4   diphenoxylate-atropine (LOMOTIL) 2.5-0.025 MG tablet Take 1 tablet by mouth 4 (four) times daily as needed for diarrhea or loose stools. 30 tablet 0   ENTRESTO 24-26 MG TAKE ONE TABLET BY MOUTH TWICE DAILY 180 tablet 2   EPINEPHRINE 0.3 mg/0.3 mL IJ SOAJ injection Inject 0.3 mLs (0.3 mg total) into the muscle as needed for anaphylaxis. 1 each 2   esomeprazole (NEXIUM) 20 MG capsule TAKE ONE  CAPSULE BY MOUTH ONCE DAILY (Patient taking differently: Take 20 mg by mouth daily.) 90 capsule 2   fluticasone (FLONASE) 50 MCG/ACT nasal spray Place 2 sprays into both nostrils daily as needed (congestion). (Patient taking differently: Place 2 sprays into both nostrils daily as needed for allergies (congestion).) 18 mL 2   furosemide (LASIX) 40 MG tablet TAKE ONE TABLET BY MOUTH EVERY MORNING MAY take 0.5 tablet in THE afternoon IF needed] (Patient taking differently: Take 20 mg by mouth 2 (two) times daily.) 90 tablet 2   gabapentin (NEURONTIN) 800 MG tablet Take 1 tablet (800 mg total) by mouth 3 (three) times daily. Patient usually takes twice a day (Patient taking differently: Take 800 mg by mouth 3 (three) times daily.) 270 tablet 2   ipratropium-albuterol (DUONEB) 0.5-2.5 (3) MG/3ML SOLN Inhale 3 mLs into the lungs every 6 (six) hours as needed (sob). 270 mL 3   levocetirizine (XYZAL) 5 MG tablet Take 5 mg by mouth daily as needed for allergies.     linaclotide (LINZESS) 290 MCG CAPS capsule Take 1 capsule (290 mcg total) by mouth daily before breakfast. 90 capsule 2   megestrol (MEGACE) 20 MG tablet TAKE ONE TABLET BY MOUTH ONCE DAILY 30 tablet 3   Multiple Vitamin (MULTIVITAMIN WITH MINERALS) TABS tablet Take 1  tablet by mouth daily.     naloxegol oxalate (MOVANTIK) 12.5 MG TABS tablet Take 1 tablet (12.5 mg total) by mouth daily. 30 tablet 5   naloxone (NARCAN) nasal spray 4 mg/0.1 mL Place 1 spray into the nose once.     neomycin-polymyxin-hydrocortisone (CORTISPORIN) OTIC solution Place 3 drops into the right ear 4 (four) times daily. 10 mL 0   nitroGLYCERIN (NITROSTAT) 0.4 MG SL tablet Place 1 tablet (0.4 mg total) under the tongue every 5 (five) minutes as needed for chest pain. 25 tablet 6   ondansetron (ZOFRAN) 4 MG tablet Take 1 tablet (4 mg total) by mouth every 8 (eight) hours as needed for nausea or vomiting. 20 tablet 0   oxyCODONE (ROXICODONE) 15 MG immediate release tablet Take  15 mg by mouth 3 (three) times daily as needed.     ranolazine (RANEXA) 500 MG 12 hr tablet TAKE 1 TABLET(500 MG) BY MOUTH TWICE DAILY 180 tablet 3   rosuvastatin (CRESTOR) 20 MG tablet TAKE ONE TABLET BY MOUTH EVERY MORNING (Patient taking differently: Take 20 mg by mouth daily.) 90 tablet 2   sertraline (ZOLOFT) 50 MG tablet TAKE 1 TABLET BY MOUTH DAILY (Patient taking differently: Take 50 mg by mouth daily.) 90 tablet 2   tiZANidine (ZANAFLEX) 4 MG tablet Take 4 mg by mouth at bedtime.     topiramate (TOPAMAX) 50 MG tablet Take 2 tablets (100 mg total) by mouth at bedtime. 240 tablet 2   UBRELVY 100 MG TABS TAKE 1 TABLET BY MOUTH BY MOUTH AS NEEDED( MAY REPEAT 1 TABLET AFTER 2 HOURS IF NEEDED, MAXIMUM 2 TABLETS IN 24 HOURS) (Patient taking differently: Take 1 tablet by mouth as directed. Take 1 tab po as needed, may repeat 1 tab after 2 hour as needed max 2 tabs in 24 hours) 16 tablet 3   No current facility-administered medications on file prior to visit.   Past Medical History:  Diagnosis Date   Abdominal pain 10/17/2020   Abnormality of gait due to impairment of balance 11/22/2020   Acute diverticulitis 10/17/2020   Admission for long-term opiate analgesic use 10/24/2019   Arthritis of right hip 05/29/2016   Formatting of this note might be different from the original. Added automatically from request for surgery [id number]   BMI 26.0-26.9,adult 03/29/2020   Bronchial asthma    Cardiomyopathy ([place])    Overview:  Ejection fraction 45% in 2015 Ejection fraction 30 to 35% in November 2018   Chronic back pain    Chronic constipation 07/31/2021   Chronic hypoxemic respiratory failure ([place]) 10/24/2019   Chronic narcotic use 03/23/2015   Chronic pain of both knees 12/21/2018   Added automatically from request for surgery [id number]  Formatting of this note might be different from the original. 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[place]) 07/13/2020  Hypotension 05/17/2020   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   Ileus following gastrointestinal surgery ([place]) 03/26/2015   Major depressive disorder, single episode, moderate ([place]) 10/24/2019   Malnutrition of moderate degree ([place]) 10/20/2020   Mesenteric ischemia ([place]) 10/16/2020   Migraine without aura with status migrainosus 10/24/2019   Mixed incontinence 10/20/2020   Myocardial infarction [facility])    Nausea & vomiting    Obstructive chronic bronchitis with exacerbation ([place]) 10/25/2019   Other spondylosis with radiculopathy, lumbar region 10/24/2019   Persistent vomiting 05/17/2020   Presence of left artificial hip joint 10/24/2019   PTSD (post-traumatic stress disorder)    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 03/23/2015 04/19/2014   Senile osteoporosis 10/24/2019   Sepsis ([place]) 10/17/2020   Serosanguineous chronic otitis media of right ear 08/02/2021   Small bowel obstruction (HCC)    Thyroid disease    Upper respiratory tract infection due to COVID-19 virus 07/06/2021   Wellness examination 03/27/2021   Past Surgical History:  Procedure Laterality Date   APPENDECTOMY     CARDIAC CATHETERIZATION     CARPAL TUNNEL RELEASE     CESAREAN SECTION      CHF s/p AICD   12/2010   CHOLECYSTECTOMY     COLONOSCOPY  10/16/2015   Mild colonic diverticulosis, predominantly in the left colon. Small internal hemrrhoids. Otherwise normal colonoscopy   COLONOSCOPY WITH PROPOFOL N/A 09/27/2021   Procedure: COLONOSCOPY WITH PROPOFOL;  Surgeon: Mauri Pole, MD;  Location: [facility];  Service: Endoscopy;  Laterality: N/A;   CORONARY ANGIOPLASTY     ESOPHAGOGASTRODUODENOSCOPY  02/04/2014   Mild gastritis. Otherwise normal EGD   HAND SURGERY     ICD GENERATOR CHANGEOUT N/A 05/21/2019   Procedure: ICD GENERATOR CHANGEOUT;  Surgeon: Constance Haw, MD;  Location: [place] [facility];  Service: Cardiovascular;  Laterality: N/A;   ICD IMPLANT     Medtronic   intestinal blockage 2011     LAPAROSCOPIC ASSISTED VENTRAL HERNIA REPAIR N/A 03/23/2015   Procedure: LAPAROSCOPIC VENTRAL WALL HERNIA REPAIR;  Surgeon: Michael Boston, MD;  Location: [facility];  Service: General;  Laterality: N/A;  With MESH   LAPAROSCOPIC LYSIS OF ADHESIONS N/A 03/23/2015   Procedure: LAPAROSCOPIC LYSIS OF ADHESIONS;  Surgeon: Michael Boston, MD;  Location: [facility];  Service: General;  Laterality: N/A;   LSCS      x2   NASAL SEPTUM SURGERY     NECK SURGERY     fused   TONSILLECTOMY     ULNAR NERVE TRANSPOSITION  01/23/2012   Procedure: ULNAR NERVE DECOMPRESSION/TRANSPOSITION;  Surgeon: Ophelia Charter, MD;  Location: [facility];  Service: Neurosurgery;  Laterality: Left;  LEFT ulnar nerve decompression    Family History  Problem Relation Age of Onset   Heart attack Other    Cancer Other    Heart failure Other    Hypertension Mother    Heart attack Mother    Alcohol abuse Father    Hypertension Father    Heart attack Father    Anesthesia problems Neg Hx    Hypotension Neg Hx    Malignant hyperthermia Neg Hx    Pseudochol deficiency Neg Hx    Social History   Socioeconomic History   Marital status: Single    Spouse name: Not on file   Number of children: 3    Years of education: Not on file   Highest education level: Not on file  Occupational History   Occupation:  disabled  Tobacco Use   Smoking status: Former    Packs/day: 0.50    Years: 30.00    Pack years: 15.00    Types: Cigarettes    Quit date: 03/2020    Years since quitting: 1.5   Smokeless tobacco: Never  Vaping Use   Vaping Use: Never used  Substance and Sexual Activity   Alcohol use: Yes    Alcohol/week: 2.0 standard drinks    Types: 1 Cans of beer, 1 Standard drinks or equivalent per week    Comment: seldom   Drug use: No   Sexual activity: Not Currently  Other Topics Concern   Not on file  Social History Narrative   One level home with boyfriend   Caffeine - coffee 1-2 cups/day; Green tea 4-5 bottles a day   Exercise - some    Right handed      Social Determinants of Health   Financial Resource Strain: Low Risk    Difficulty of Paying Living Expenses: Not hard at all  Food Insecurity: No Food Insecurity   Worried About Charity fundraiser in the Last Year: Never true   Ran Out of Food in the Last Year: Never true  Transportation Needs: Unmet Transportation Needs   Lack of Transportation (Medical): Not on file   Lack of Transportation (Non-Medical): Yes  Physical Activity: Inactive   Days of Exercise per Week: 0 days   Minutes of Exercise per Session: 0 min  Stress: Stress Concern Present   Feeling of Stress : Rather much  Social Connections: Moderately Integrated   Frequency of Communication with Friends and Family: More than three times a week   Frequency of Social Gatherings with Friends and Family: Never   Attends Religious Services: 1 to 4 times per year   Active Member of [employer] or Organizations: No   Attends Archivist Meetings: Never   Marital Status: Married    Review of Systems  Constitutional:  Negative for appetite change, fatigue and fever.  HENT:  Negative for congestion, ear pain, sinus pressure and sore throat.   Eyes:   Negative for pain.  Respiratory:  Negative for cough, chest tightness, shortness of breath and wheezing.   Cardiovascular:  Negative for chest pain and palpitations.  Gastrointestinal:  Negative for abdominal pain, constipation, diarrhea, nausea and vomiting.  Genitourinary:  Negative for dysuria and hematuria.  Musculoskeletal:  Negative for arthralgias, back pain, joint swelling and myalgias.  Skin:  Negative for rash.  Neurological:  Negative for dizziness, weakness and headaches.  Psychiatric/Behavioral:  Negative for dysphoric mood. The patient is not nervous/anxious.     Objective:  BP 108/62 (BP Location: Right Arm, Patient Position: Sitting)    Pulse 91    Temp 98.5 F (36.9 C) (Oral)    Ht 5' (1.524 m)    Wt 133 lb (60.3 kg)    SpO2 100%    BMI 25.97 kg/m   BP/Weight 10/02/2021 09/27/2021 60/05/9322  Systolic BP 557 322 98  Diastolic BP 62 62 64  Wt. (Lbs) 133 130 123  BMI 25.97 25.39 24.02    Physical Exam Vitals reviewed.  Constitutional:      Appearance: Normal appearance. She is normal weight.  Neck:     Vascular: No carotid bruit.  Cardiovascular:     Rate and Rhythm: Normal rate and regular rhythm.     Heart sounds: Normal heart sounds.  Pulmonary:     Effort: Pulmonary effort is normal. No respiratory distress.  Breath sounds: Normal breath sounds.  Abdominal:     General: Abdomen is flat. Bowel sounds are normal.     Palpations: Abdomen is soft.     Tenderness: There is no abdominal tenderness.  Neurological:     Mental Status: She is alert and oriented to person, place, and time.  Psychiatric:        Mood and Affect: Mood normal.        Behavior: Behavior normal.    Diabetic Foot Exam - Simple   No data filed      Lab Results  Component Value Date   WBC 9.9 05/22/2021   HGB 11.6 05/22/2021   HCT 34.3 05/22/2021   PLT 345 05/22/2021   GLUCOSE 93 06/26/2021   CHOL 171 05/22/2021   TRIG 93 05/22/2021   HDL 63 05/22/2021   LDLCALC 91  05/22/2021   ALT 5 05/22/2021   AST 11 05/22/2021   NA 138 06/26/2021   K 4.3 06/26/2021   CL 103 06/26/2021   CREATININE 1.23 (H) 06/26/2021   BUN 21 06/26/2021   CO2 21 06/26/2021   TSH 0.816 02/15/2021   INR 1.0 10/18/2020      Assessment & Plan:   Problem List Items Addressed This Visit       Cardiovascular and Mediastinum   Chronic systolic congestive heart failure, NYHA class 2 (HCC)   Coronary artery disease involving native coronary artery of native heart without angina pectoris     Respiratory   COPD (chronic obstructive pulmonary disease) (HCC) - Primary   Chronic hypoxemic respiratory failure (HCC)     Digestive   GERD (gastroesophageal reflux disease)     Other   Chronic narcotic use   ICD (implantable cardioverter-defibrillator) in place   Dyslipidemia  .  No orders of the defined types were placed in this encounter.   No orders of the defined types were placed in this encounter.    Follow-up: No follow-ups on file.  An After Visit Summary was printed and given to the patient.  Reinaldo Meeker, MD [facility] [phone]

## 2021-10-03 LAB — COMPREHENSIVE METABOLIC PANEL
ALT: 8 IU/L (ref 0–32)
AST: 15 IU/L (ref 0–40)
Albumin/Globulin Ratio: 2 (ref 1.2–2.2)
Albumin: 4.1 g/dL (ref 3.8–4.8)
Alkaline Phosphatase: 46 IU/L (ref 44–121)
BUN/Creatinine Ratio: 18 (ref 12–28)
BUN: 25 mg/dL (ref 8–27)
Bilirubin Total: 0.3 mg/dL (ref 0.0–1.2)
CO2: 19 mmol/L — ABNORMAL LOW (ref 20–29)
Calcium: 9.5 mg/dL (ref 8.7–10.3)
Chloride: 109 mmol/L — ABNORMAL HIGH (ref 96–106)
Creatinine, Ser: 1.42 mg/dL — ABNORMAL HIGH (ref 0.57–1.00)
Globulin, Total: 2.1 g/dL (ref 1.5–4.5)
Glucose: 113 mg/dL — ABNORMAL HIGH (ref 70–99)
Potassium: 4.2 mmol/L (ref 3.5–5.2)
Sodium: 144 mmol/L (ref 134–144)
Total Protein: 6.2 g/dL (ref 6.0–8.5)
eGFR: 41 mL/min/{1.73_m2} — ABNORMAL LOW (ref >59)

## 2021-10-03 LAB — LIPID PANEL
Chol/HDL Ratio: 3 ratio (ref 0.0–4.4)
Cholesterol, Total: 176 mg/dL (ref 100–199)
HDL: 58 mg/dL (ref >39)
LDL Chol Calc (NIH): 93 mg/dL (ref 0–99)
Triglycerides: 145 mg/dL (ref 0–149)
VLDL Cholesterol Cal: 25 mg/dL (ref 5–40)

## 2021-10-03 LAB — CBC WITH DIFFERENTIAL/PLATELET
Basophils Absolute: 0 10*3/uL (ref 0.0–0.2)
Basos: 0 %
EOS (ABSOLUTE): 0.3 10*3/uL (ref 0.0–0.4)
Eos: 3 %
Hematocrit: 28 % — ABNORMAL LOW (ref 34.0–46.6)
Hemoglobin: 9.4 g/dL — ABNORMAL LOW (ref 11.1–15.9)
Immature Grans (Abs): 0 10*3/uL (ref 0.0–0.1)
Immature Granulocytes: 0 %
Lymphocytes Absolute: 2.3 10*3/uL (ref 0.7–3.1)
Lymphs: 23 %
MCH: 31.2 pg (ref 26.6–33.0)
MCHC: 33.6 g/dL (ref 31.5–35.7)
MCV: 93 fL (ref 79–97)
Monocytes Absolute: 0.5 10*3/uL (ref 0.1–0.9)
Monocytes: 6 %
Neutrophils Absolute: 6.6 10*3/uL (ref 1.4–7.0)
Neutrophils: 68 %
Platelets: 246 10*3/uL (ref 150–450)
RBC: 3.01 x10E6/uL — ABNORMAL LOW (ref 3.77–5.28)
RDW: 12.3 % (ref 11.7–15.4)
WBC: 9.8 10*3/uL (ref 3.4–10.8)

## 2021-10-03 LAB — CARDIOVASCULAR RISK ASSESSMENT

## 2021-10-03 LAB — PRO B NATRIURETIC PEPTIDE: NT-Pro BNP: 1261 pg/mL — ABNORMAL HIGH (ref 0–301)

## 2021-10-03 NOTE — Progress Notes (Signed)
Glucose 113, kidney remains stage 3b, liver tests normal, worsening anemia, ? Blood loss, just had colonoscopy, pro BNP elevated 1261, Cholesterol OK,  lp

## 2021-10-10 NOTE — Progress Notes (Signed)
Virtual Visit via Video Note The purpose of this virtual visit is to provide medical care while limiting exposure to the novel coronavirus.    Consent was obtained for video visit:  Yes.   Answered questions that patient had about telehealth interaction:  Yes.   I discussed the limitations, risks, security and privacy concerns of performing an evaluation and management service by telemedicine. I also discussed with the patient that there may be a patient responsible charge related to this service. The patient expressed understanding and agreed to proceed.  Pt location: Home Physician Location: office Name of referring provider:  Lillard Anes,* I connected with [name] at patients initiation/request on 10/15/2021 at  1:30 PM EST by video enabled telemedicine application and verified that I am speaking with the correct person using two identifiers. Pt MRN:  [id number] Pt DOB:  1955-09-11 Video Participants:  [name]  Assessment and Plan:   Chronic migraine without aura, without status migrainosus, not intractable     1.Migraine prevention:  Aimovig 140mg  every 28 days; topiramate 100mg  QHS 2.Migraine rescue:  Roselyn Meier 100mg  3.Limit use of pain relievers to no more than 2 days out of week to prevent risk of rebound or medication-overuse headache. 4. Keep headache diary 5. Follow up 9 months.  History of Present Illness:  [name] is a 66 year old Caucasian woman with CHF with EF 30-35%, s/p AICD, COPD, chronic back pain, chronic knee pain, arthritis, HTN, hyperlipidemia and PTSD and history of MI and former smoker and history of childhood seizures who follows up for migraines and dizziness.   UPDATE: Over past year, migraines and dizzy spells became more frequent (daily and 3 times a week respectively).  We started Aimovig Much better.  Intensity:  severe Duration:  1 hour if takes Ubrelvy immediately.   Frequency:  once a month Frequency of abortive  medication:once a month Current NSAIDS:  ASA 81mg ; Celebrex Current analgesics:  oxycodone (for back pain - takes almost), Belbuca (for back pain) Current triptans:  none Current ergotamine:  none Current anti-emetic:  none Current muscle relaxants:  none Current anti-anxiolytic:  Valium Current sleep aide:  none Current Antihypertensive medications:  Coreg, Lasix Current Antidepressant medications:  sertraline Current Anticonvulsant medications:  Topiramate 100mg  at bedtime; gabapentin 800mg  twice daily Current anti-CGRP:  Aimovig 140mg , Ubrelvy 100mg  Current Vitamins/Herbal/Supplements:  none Current Antihistamines/Decongestants:  Flonase Other therapy:  none Hormone/birth control:  none   Caffeine:  1 cup of coffee daily Alcohol:  no Smoker: former Diet: 100 oz water daily.  No soda. Exercise: no Pain:  back pain, chronic   HISTORY: She has had headaches since around 2017 but gradually got worse, particularly since November 2019.  They are severe bifrontal pounding/squeezing headache.  There is no aura.  They are associated with photophobia, phonophobia, blurred vision and dizziness.  They typically last 1 hour and occur 5 to 6 a day every day.  They are triggered by loud noise.  Sumatriptan helps relieve the intensity but does not abort it..   Since November 2019, she also has had episodes of dizziness outside of the dizziness as well, causing her to fall.  She reports sudden spinning sensation lasting a few seconds and resolves.  She says it is not positional.  She has associated nausea and blurred vision.  She says it occurs 2 to 3 times a day.     She had a CT of head without contrast on 11/02/18 which was personally reviewed  and was unremarkable, demonstrating mild atrophy and mild chronic small vessel ischemic changes but no acute intracranial abnormality.   She is treated by pain management for chronic pain.  She has degenerative disc disease of the cervical spine with  previous neck fusion.  She has history of total right hip replacement.  She has chronic low back pain with radicular symptoms.  She has bilateral knee pain.   Past NSAIDS:  none Past analgesics:  Tylenol (contraindicated due to CHF) Past abortive triptans: sumatriptan 25mg  Past abortive ergotamine:  none Past muscle relaxants:  none Past anti-emetic:  none Past antihypertensive medications:  lisinopril Past antidepressant medications:  none Past anticonvulsant medications:  none Past anti-CGRP:  none Past vitamins/Herbal/Supplements:  none Past antihistamines/decongestants:  none Other past therapies:  none  Past Medical History: Past Medical History:  Diagnosis Date   Abdominal pain 10/17/2020   Abnormality of gait due to impairment of balance 11/22/2020   Acute diverticulitis 10/17/2020   Admission for long-term opiate analgesic use 10/24/2019   Arthritis of right hip 05/29/2016   Formatting of this note might be different from the original. Added automatically from request for surgery [id number]   BMI 26.0-26.9,adult 03/29/2020   Bronchial asthma    Cardiomyopathy ([place])    Overview:  Ejection fraction 45% in 2015 Ejection fraction 30 to 35% in November 2018   Chronic back pain    Chronic constipation 07/31/2021   Chronic hypoxemic respiratory failure ([place]) 10/24/2019   Chronic narcotic use 03/23/2015   Chronic pain of both knees 12/21/2018   Added automatically from request for surgery [id number]  Formatting of this note might be different from the original. 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[place]) 07/13/2020   Hypotension 05/17/2020   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   Ileus following gastrointestinal surgery ([place]) 03/26/2015   Major depressive disorder, single episode, moderate ([place]) 10/24/2019   Malnutrition of moderate degree ([place]) 10/20/2020   Mesenteric ischemia ([place]) 10/16/2020   Migraine without aura with status migrainosus 10/24/2019   Mixed incontinence 10/20/2020   Myocardial infarction (HCC)    Nausea & vomiting    Obstructive chronic bronchitis with exacerbation ([place]) 10/25/2019   Other spondylosis with radiculopathy, lumbar region 10/24/2019   Persistent vomiting 05/17/2020   Presence of left artificial hip joint 10/24/2019   PTSD (post-traumatic stress disorder)    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 03/23/2015 04/19/2014   Senile osteoporosis 10/24/2019   Sepsis ([place]) 10/17/2020   Serosanguineous chronic otitis media of right ear 08/02/2021   Small bowel obstruction (HCC)    Thyroid disease    Upper respiratory tract infection due to COVID-19 virus 07/06/2021   Wellness examination 03/27/2021    Medications: Outpatient Encounter Medications as of 10/15/2021  Medication Sig   AIMOVIG 140 MG/ML SOAJ inject 140mg   into THE SKIN every 28 DAYS   ARIPiprazole (ABILIFY) 2 MG tablet TAKE ONE TABLET BY MOUTH ONCE DAILY   aspirin 81 MG tablet Take 81 mg by mouth daily.   BREO ELLIPTA 100-25 MCG/ACT AEPB INHALE 1 PUFF BY MOUTH INTO LUNGS DAILY in THE afternoon (Patient taking differently: 1 puff 2 (two) times daily.)   Buprenorphine HCl (BELBUCA) 900 MCG FILM Place 900 mcg  inside cheek every 12 (twelve) hours.   carvedilol (COREG) 3.125 MG tablet Take 1 tablet (3.125 mg total) by mouth 2 (two) times daily with a meal.   celecoxib (CELEBREX) 200 MG capsule TAKE ONE CAPSULE BY MOUTH EVERY MORNING   cyclobenzaprine (FLEXERIL) 10 MG tablet Take 10 mg by mouth 3 (three) times daily as needed for muscle spasms.   denosumab (PROLIA) 60 MG/ML SOSY injection Inject 60 mg into the skin every 6 (six) months.   diazepam (VALIUM) 5 MG tablet Take 1 tablet (5 mg total) by mouth 3 (three) times daily as needed for anxiety.   diclofenac Sodium (VOLTAREN) 1 % GEL Apply 2 g topically 4 (four) times daily. (Patient taking differently: Apply 2 g topically daily as needed (Pain).)   diphenoxylate-atropine (LOMOTIL) 2.5-0.025 MG tablet Take 1 tablet by mouth 4 (four) times daily as needed for diarrhea or loose stools.   ENTRESTO 24-26 MG TAKE ONE TABLET BY MOUTH TWICE DAILY   EPINEPHRINE 0.3 mg/0.3 mL IJ SOAJ injection Inject 0.3 mLs (0.3 mg total) into the muscle as needed for anaphylaxis.   esomeprazole (NEXIUM) 20 MG capsule TAKE ONE CAPSULE BY MOUTH ONCE DAILY (Patient taking differently: Take 20 mg by mouth daily.)   fluticasone (FLONASE) 50 MCG/ACT nasal spray Place 2 sprays into both nostrils daily as needed (congestion). (Patient taking differently: Place 2 sprays into both nostrils daily as needed for allergies (congestion).)   furosemide (LASIX) 40 MG tablet TAKE ONE TABLET BY MOUTH EVERY MORNING MAY take 0.5 tablet in THE afternoon IF needed] (Patient taking differently: Take 20 mg by mouth 2 (two) times daily.)   gabapentin (NEURONTIN) 800 MG tablet Take 1 tablet (800 mg total) by mouth 3 (three) times daily. Patient usually takes twice a day (Patient taking differently: Take 800 mg by mouth 3 (three) times daily.)   ipratropium-albuterol (DUONEB) 0.5-2.5 (3) MG/3ML SOLN Inhale 3 mLs into the lungs every 6 (six) hours as needed (sob).   levocetirizine (XYZAL) 5 MG tablet Take 5  mg by mouth daily as needed for allergies.   megestrol (MEGACE) 20 MG tablet TAKE ONE TABLET BY MOUTH ONCE DAILY   Multiple Vitamin (MULTIVITAMIN WITH MINERALS) TABS tablet Take 1 tablet by mouth daily.   naloxegol oxalate (MOVANTIK) 12.5 MG TABS tablet Take 1 tablet (12.5 mg total) by mouth daily.   naloxone (NARCAN) nasal spray 4 mg/0.1 mL Place 1 spray into the nose once.   nitroGLYCERIN (NITROSTAT) 0.4 MG SL tablet Place 1 tablet (0.4 mg total) under the tongue every 5 (five) minutes as needed for chest pain.   ondansetron (ZOFRAN) 4 MG tablet Take 1 tablet (4 mg total) by mouth every 8 (eight) hours as needed for nausea or vomiting.   oxyCODONE (ROXICODONE) 15 MG immediate release tablet Take 15 mg by mouth 3 (three) times daily as needed.   ranolazine (RANEXA) 500 MG 12 hr tablet TAKE 1 TABLET(500 MG) BY MOUTH TWICE DAILY   rosuvastatin (CRESTOR) 20 MG tablet TAKE ONE TABLET BY MOUTH EVERY MORNING (Patient taking differently: Take 20 mg by mouth daily.)  sertraline (ZOLOFT) 50 MG tablet TAKE 1 TABLET BY MOUTH DAILY (Patient taking differently: Take 50 mg by mouth daily.)   tiZANidine (ZANAFLEX) 4 MG tablet Take 4 mg by mouth at bedtime.   topiramate (TOPAMAX) 50 MG tablet Take 2 tablets (100 mg total) by mouth at bedtime.   UBRELVY 100 MG TABS TAKE 1 TABLET BY MOUTH BY MOUTH AS NEEDED( MAY REPEAT 1 TABLET AFTER 2 HOURS IF NEEDED, MAXIMUM 2 TABLETS IN 24 HOURS) (Patient taking differently: Take 1 tablet by mouth as directed. Take 1 tab po as needed, may repeat 1 tab after 2 hour as needed max 2 tabs in 24 hours)   No facility-administered encounter medications on file as of 10/15/2021.    Allergies: No Known Allergies  Family History: Family History  Problem Relation Age of Onset   Heart attack Other    Cancer Other    Heart failure Other    Hypertension Mother    Heart attack Mother    Alcohol abuse Father    Hypertension Father    Heart attack Father    Anesthesia problems Neg  Hx    Hypotension Neg Hx    Malignant hyperthermia Neg Hx    Pseudochol deficiency Neg Hx     Observations/Objective:   Height 5' (1.524 m), weight 130 lb (59 kg). No acute distress.  Alert and oriented.  Speech fluent and not dysarthric.  Language intact.     Follow Up Instructions:    -I discussed the assessment and treatment plan with the patient. The patient was provided an opportunity to ask questions and all were answered. The patient agreed with the plan and demonstrated an understanding of the instructions.   The patient was advised to call back or seek an in-person evaluation if the symptoms worsen or if the condition fails to improve as anticipated.   Dudley Major, DO

## 2021-10-15 ENCOUNTER — Telehealth (INDEPENDENT_AMBULATORY_CARE_PROVIDER_SITE_OTHER): Payer: 59 | Admitting: Neurology

## 2021-10-15 ENCOUNTER — Other Ambulatory Visit: Payer: Self-pay | Admitting: Legal Medicine

## 2021-10-15 ENCOUNTER — Encounter: Payer: Self-pay | Admitting: Neurology

## 2021-10-15 VITALS — Ht 60.0 in | Wt 130.0 lb

## 2021-10-15 DIAGNOSIS — G43009 Migraine without aura, not intractable, without status migrainosus: Secondary | ICD-10-CM

## 2021-10-15 DIAGNOSIS — F321 Major depressive disorder, single episode, moderate: Secondary | ICD-10-CM

## 2021-10-15 MED ORDER — AIMOVIG 140 MG/ML ~~LOC~~ SOAJ: SUBCUTANEOUS | 5 refills | Status: DC

## 2021-10-15 MED ORDER — UBRELVY 100 MG PO TABS
100.0000 mg | ORAL_TABLET | ORAL | 5 refills | Status: DC | PRN
Start: 2021-10-15 — End: 2023-04-07

## 2021-10-15 MED ORDER — TOPIRAMATE 50 MG PO TABS: 100.0000 mg | ORAL_TABLET | Freq: Every day | ORAL | 2 refills | Status: DC

## 2021-10-15 NOTE — Patient Instructions (Signed)
Continue topiramate 100mg  at bedtime and Aimovig 140mg  every 28 days Continue Ubrelvy as needed

## 2021-10-16 ENCOUNTER — Other Ambulatory Visit: Payer: Self-pay

## 2021-10-16 ENCOUNTER — Telehealth: Payer: Self-pay

## 2021-10-16 ENCOUNTER — Ambulatory Visit (INDEPENDENT_AMBULATORY_CARE_PROVIDER_SITE_OTHER): Payer: 59 | Admitting: Cardiology

## 2021-10-16 ENCOUNTER — Encounter: Payer: Self-pay | Admitting: Cardiology

## 2021-10-16 VITALS — BP 102/68 | HR 84 | Ht 60.0 in | Wt 138.2 lb

## 2021-10-16 DIAGNOSIS — I493 Ventricular premature depolarization: Secondary | ICD-10-CM | POA: Diagnosis not present

## 2021-10-16 DIAGNOSIS — I5022 Chronic systolic (congestive) heart failure: Secondary | ICD-10-CM | POA: Diagnosis not present

## 2021-10-16 DIAGNOSIS — I25119 Atherosclerotic heart disease of native coronary artery with unspecified angina pectoris: Secondary | ICD-10-CM

## 2021-10-16 NOTE — Patient Instructions (Signed)
Medication Instructions:  Your physician recommends that you continue on your current medications as directed. Please refer to the Current Medication list given to you today.  *If you need a refill on your cardiac medications before your next appointment, please call your pharmacy*   Lab Work: None ordered   Testing/Procedures: None ordered   Follow-Up: At [facility], you and your health needs are our priority.  As part of our continuing mission to provide you with exceptional heart care, we have created designated Provider Care Teams.  These Care Teams include your primary Cardiologist (physician) and Advanced Practice Providers (APPs -  Physician Assistants and Nurse Practitioners) who all work together to provide you with the care you need, when you need it.  Remote monitoring is used to monitor your Pacemaker or ICD from home. This monitoring reduces the number of office visits required to check your device to one time per year. It allows [place] to keep an eye on the functioning of your device to ensure it is working properly. You are scheduled for a device check from home on 11/29/2021. You may send your transmission at any time that day. If you have a wireless device, the transmission will be sent automatically. After your physician reviews your transmission, you will receive a postcard with your next transmission date.  Your next appointment:   1 year(s)  The format for your next appointment:   In Person  Provider:   Allegra Lai, MD   Thank you for choosing [facility]!!   Trinidad Curet, RN [phone]

## 2021-10-16 NOTE — Chronic Care Management (AMB) (Signed)
Chronic Care Management Pharmacy Assistant   Name: [name]  MRN: [id number] DOB: 03-03-1956   Reason for Encounter: Medication Coordination for [facility]    Recent office visits:  10/02/21 Reinaldo Meeker MD. Seen for COPD. D/C Linzess 290mg .  08/15/21 Perry,Lawrence MD. Seen for Ear Pain. Started Clarithromycin 500mg  and Cortisporin 3.5-10000 drops.   08/08/21 Reinaldo Meeker MD. Orders Only. Started Zithromax 250mg .   08/07/21 Reinaldo Meeker MD. Orders Only. D/C Bactrim 800-160mg   08/02/21 Reinaldo Meeker MD. Seen for UTI and Hearing Loss. Started on Ciprofloxacin HCI 500mg  and Prednisone 10mg .   Recent consult visits:  10/15/21 (Neurology) Video Visit. Metta Clines DO. Seen for Migraine. No med changes.   07/31/21 (Gastroenterology) Alonza Bogus PA-C. Seen for constipation. Referral to Gastroenterology for colonoscopy. Started on Na Sulfate-K Sulfate-Mg 17.5-3.13-1.6gm. Started on Naloxegol Oxalate 12.5mg .   Hospital visits:  Medication Reconciliation was completed by comparing discharge summary, patients EMR and Pharmacy list, and upon discussion with patient.  Admitted to the hospital on 09/27/21 due to Chronic Constipation. Discharge date was 09/27/21. Discharged from [facility].    Medications remain the same after Hospital Discharge:??   Medications: Outpatient Encounter Medications as of 10/16/2021  Medication Sig   ARIPiprazole (ABILIFY) 2 MG tablet TAKE ONE TABLET BY MOUTH ONCE DAILY   aspirin 81 MG tablet Take 81 mg by mouth daily.   BREO ELLIPTA 100-25 MCG/ACT AEPB INHALE 1 PUFF BY MOUTH INTO LUNGS DAILY in THE afternoon (Patient taking differently: 1 puff 2 (two) times daily.)   Buprenorphine HCl (BELBUCA) 900 MCG FILM Place 900 mcg inside cheek every 12 (twelve) hours.   carvedilol (COREG) 3.125 MG tablet Take 1 tablet (3.125 mg total) by mouth 2 (two) times daily with a meal.   celecoxib (CELEBREX) 200 MG capsule TAKE ONE CAPSULE BY MOUTH  EVERY MORNING   cyclobenzaprine (FLEXERIL) 10 MG tablet Take 10 mg by mouth 3 (three) times daily as needed for muscle spasms.   denosumab (PROLIA) 60 MG/ML SOSY injection Inject 60 mg into the skin every 6 (six) months.   diazepam (VALIUM) 5 MG tablet Take 1 tablet (5 mg total) by mouth 3 (three) times daily as needed for anxiety.   diclofenac Sodium (VOLTAREN) 1 % GEL Apply 2 g topically 4 (four) times daily. (Patient taking differently: Apply 2 g topically daily as needed (Pain).)   diphenoxylate-atropine (LOMOTIL) 2.5-0.025 MG tablet Take 1 tablet by mouth 4 (four) times daily as needed for diarrhea or loose stools.   ENTRESTO 24-26 MG TAKE ONE TABLET BY MOUTH TWICE DAILY   EPINEPHRINE 0.3 mg/0.3 mL IJ SOAJ injection Inject 0.3 mLs (0.3 mg total) into the muscle as needed for anaphylaxis.   Erenumab-aooe (AIMOVIG) 140 MG/ML SOAJ inject 140mg  into THE SKIN every 28 DAYS   esomeprazole (NEXIUM) 20 MG capsule TAKE ONE CAPSULE BY MOUTH ONCE DAILY (Patient taking differently: Take 20 mg by mouth daily.)   fluticasone (FLONASE) 50 MCG/ACT nasal spray Place 2 sprays into both nostrils daily as needed (congestion). (Patient taking differently: Place 2 sprays into both nostrils daily as needed for allergies (congestion).)   furosemide (LASIX) 40 MG tablet TAKE ONE TABLET BY MOUTH EVERY MORNING MAY take 0.5 tablet in THE afternoon IF needed] (Patient taking differently: Take 20 mg by mouth 2 (two) times daily.)   gabapentin (NEURONTIN) 800 MG tablet Take 1 tablet (800 mg total) by mouth 3 (three) times daily. Patient usually takes twice a day (Patient taking differently: Take 800  mg by mouth 3 (three) times daily.)   ipratropium-albuterol (DUONEB) 0.5-2.5 (3) MG/3ML SOLN Inhale 3 mLs into the lungs every 6 (six) hours as needed (sob).   levocetirizine (XYZAL) 5 MG tablet Take 5 mg by mouth daily as needed for allergies.   megestrol (MEGACE) 20 MG tablet TAKE ONE TABLET BY MOUTH ONCE DAILY   Multiple  Vitamin (MULTIVITAMIN WITH MINERALS) TABS tablet Take 1 tablet by mouth daily.   naloxegol oxalate (MOVANTIK) 12.5 MG TABS tablet Take 1 tablet (12.5 mg total) by mouth daily.   naloxone (NARCAN) nasal spray 4 mg/0.1 mL Place 1 spray into the nose once.   nitroGLYCERIN (NITROSTAT) 0.4 MG SL tablet Place 1 tablet (0.4 mg total) under the tongue every 5 (five) minutes as needed for chest pain.   ondansetron (ZOFRAN) 4 MG tablet Take 1 tablet (4 mg total) by mouth every 8 (eight) hours as needed for nausea or vomiting.   oxyCODONE (ROXICODONE) 15 MG immediate release tablet Take 15 mg by mouth 3 (three) times daily as needed.   ranolazine (RANEXA) 500 MG 12 hr tablet TAKE 1 TABLET(500 MG) BY MOUTH TWICE DAILY   rosuvastatin (CRESTOR) 20 MG tablet TAKE ONE TABLET BY MOUTH EVERY MORNING (Patient taking differently: Take 20 mg by mouth daily.)   sertraline (ZOLOFT) 50 MG tablet TAKE ONE TABLET BY MOUTH ONCE DAILY   tiZANidine (ZANAFLEX) 4 MG tablet Take 4 mg by mouth at bedtime.   topiramate (TOPAMAX) 50 MG tablet Take 2 tablets (100 mg total) by mouth at bedtime.   Ubrogepant (UBRELVY) 100 MG TABS Take 100 mg by mouth as needed (May repeat after 2 hours.  Maximum 2 tablets in 24 hours). TAKE 1 TABLET BY MOUTH BY MOUTH AS NEEDED( MAY REPEAT 1 TABLET AFTER 2 HOURS IF NEEDED, MAXIMUM 2 TABLETS IN 24 HOURS) Strength: 100 mg   No facility-administered encounter medications on file as of 10/16/2021.    Reviewed chart for medication changes ahead of medication coordination call.   BP Readings from Last 3 Encounters:  10/02/21 108/62  09/27/21 100/62  08/15/21 98/64    No results found for: HGBA1C   Patient obtains medications through Vials  90 Days   Last adherence delivery included:  Breo ellipta 100-25 mg 1 puff daily Sertraline 100 mg half tablet daily Abilify 2 mg daily Megestrol 20 mg daily Lasix 40 mg 1 tablet every morning and half tablet in afternoon if needed Prilosec 20 mg  daily Entresto 24-26 mg twice daily Celebrex 200 mg daily Rosuvastatin 20 mg daily Diazepam 5 mg 3 times daily as needed Carvedilol 3.125mg  1 tab twice daily   Patient declined (meds) last delivery None  Patient is due for next adherence delivery on: 10/29/21. Called patient and reviewed medications and coordinated delivery.  This delivery to include: Aimovig 140mg -inject 140mg  into the skin once a month Breo ellipta 100-25 mg 1 puff daily Megestrol 20 mg daily Abilify 2 mg daily Lasix 40 mg 1 tablet every morning and half tablet in afternoon if needed Esomeprazole 20mg - 1 capsule once daily  Entresto 24-26 mg twice daily Celebrex 200 mg daily Rosuvastatin 20 mg daily Carvedilol 3.125mg  1 tab twice daily  Diazepam 5 mg 3 times daily as needed Sertraline 50 mg 1 tablet daily   Patient declined the following medications (meds) due to (reason)  Patient needs refills - Refill request sent  Megestrol 20 mg  Abilify 2 mg  Lasix 40 mg  Sertraline 50 mg  Unable to reach  pt after several attempts   Elray Mcgregor, [place] Pharmacist Assistant  [phone]

## 2021-10-16 NOTE — Progress Notes (Signed)
Electrophysiology Office Note   Date:  10/16/2021   ID:  Crystal, [name] 04-06-1956, MRN [id number]  PCP:  Crystal Anes, MD  Cardiologist: Agustin Cree Primary Electrophysiologist:  Crystal Mullen Meredith Leeds, MD    No chief complaint on file.    History of Present Illness: [name] is a 66 y.o. female who is being seen today for the evaluation of ischemic cardiomyopathy at the request of [name]. Presenting today for electrophysiology evaluation.    She has a history significant for coronary artery disease status post MI, ischemic cardiomyopathy, PVCs, hypertension, hyperlipidemia.  She is status post Medtronic dual-chamber ICD with generator change 05/21/2019.  Today, denies symptoms of palpitations, chest pain, shortness of breath, orthopnea, PND, lower extremity edema, claudication, dizziness, presyncope, syncope, bleeding, or neurologic sequela. The patient is tolerating medications without difficulties.  Since being seen she has done well.  She has had no shortness of breath.  1 month ago she did have chest pain and had to take nitroglycerin x3.  She has not had any recurrences.   Past Medical History:  Diagnosis Date   Abdominal pain 10/17/2020   Abnormality of gait due to impairment of balance 11/22/2020   Acute diverticulitis 10/17/2020   Admission for long-term opiate analgesic use 10/24/2019   Arthritis of right hip 05/29/2016   Formatting of this note might be different from the original. Added automatically from request for surgery [id number]   BMI 26.0-26.9,adult 03/29/2020   Bronchial asthma    Cardiomyopathy ([place])    Overview:  Ejection fraction 45% in 2015 Ejection fraction 30 to 35% in November 2018   Chronic back pain    Chronic constipation 07/31/2021   Chronic hypoxemic respiratory failure ([place]) 10/24/2019   Chronic narcotic use 03/23/2015   Chronic pain of both knees 12/21/2018   Added automatically from request for surgery [id number]  Formatting  of this note might be different from the original. 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   Hypotension 05/17/2020   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   Ileus following gastrointestinal surgery ([place]) 03/26/2015   Major depressive disorder, single episode, moderate ([place]) 10/24/2019   Malnutrition of moderate degree ([place]) 10/20/2020   Mesenteric ischemia ([place]) 10/16/2020   Migraine without aura with status migrainosus 10/24/2019   Mixed incontinence 10/20/2020   Myocardial infarction (HCC)    Nausea & vomiting    Obstructive chronic bronchitis with exacerbation ([place]) 10/25/2019   Other spondylosis with radiculopathy, lumbar region 10/24/2019   Persistent vomiting 05/17/2020   Presence of left artificial hip joint 10/24/2019   PTSD (post-traumatic stress disorder)    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 03/23/2015 04/19/2014   Senile osteoporosis 10/24/2019   Sepsis ([place]) 10/17/2020   Serosanguineous chronic otitis media of right ear 08/02/2021  Small bowel obstruction (HCC)    Thyroid disease    Upper respiratory tract infection due to COVID-19 virus 07/06/2021   Wellness examination  03/27/2021   Past Surgical History:  Procedure Laterality Date   APPENDECTOMY     CARDIAC CATHETERIZATION     CARPAL TUNNEL RELEASE     CESAREAN SECTION     CHF s/p AICD   12/2010   CHOLECYSTECTOMY     COLONOSCOPY  10/16/2015   Mild colonic diverticulosis, predominantly in the left colon. Small internal hemrrhoids. Otherwise normal colonoscopy   COLONOSCOPY WITH PROPOFOL N/A 09/27/2021   Procedure: COLONOSCOPY WITH PROPOFOL;  Surgeon: Mauri Pole, MD;  Location: [facility];  Service: Endoscopy;  Laterality: N/A;   CORONARY ANGIOPLASTY     ESOPHAGOGASTRODUODENOSCOPY  02/04/2014   Mild gastritis. Otherwise normal EGD   HAND SURGERY     ICD GENERATOR CHANGEOUT N/A 05/21/2019   Procedure: ICD GENERATOR CHANGEOUT;  Surgeon: Constance Haw, MD;  Location: [facility];  Service: Cardiovascular;  Laterality: N/A;   ICD IMPLANT     Medtronic   intestinal blockage 2011     LAPAROSCOPIC ASSISTED VENTRAL HERNIA REPAIR N/A 03/23/2015   Procedure: LAPAROSCOPIC VENTRAL WALL HERNIA REPAIR;  Surgeon: Michael Boston, MD;  Location: [facility];  Service: General;  Laterality: N/A;  With MESH   LAPAROSCOPIC LYSIS OF ADHESIONS N/A 03/23/2015   Procedure: LAPAROSCOPIC LYSIS OF ADHESIONS;  Surgeon: Michael Boston, MD;  Location: [facility];  Service: General;  Laterality: N/A;   LSCS      x2   NASAL SEPTUM SURGERY     NECK SURGERY     fused   TONSILLECTOMY     ULNAR NERVE TRANSPOSITION  01/23/2012   Procedure: ULNAR NERVE DECOMPRESSION/TRANSPOSITION;  Surgeon: Ophelia Charter, MD;  Location: [facility];  Service: Neurosurgery;  Laterality: Left;  LEFT ulnar nerve decompression     Current Outpatient Medications  Medication Sig Dispense Refill   ARIPiprazole (ABILIFY) 2 MG tablet TAKE ONE TABLET BY MOUTH ONCE DAILY 30 tablet 3   aspirin 81 MG tablet Take 81 mg by mouth daily.     BREO ELLIPTA 100-25 MCG/ACT AEPB INHALE 1 PUFF BY MOUTH INTO LUNGS DAILY in THE afternoon (Patient taking  differently: 1 puff 2 (two) times daily.) 180 each 2   Buprenorphine HCl (BELBUCA) 900 MCG FILM Place 900 mcg inside cheek every 12 (twelve) hours.     carvedilol (COREG) 3.125 MG tablet Take 1 tablet (3.125 mg total) by mouth 2 (two) times daily with a meal. 180 tablet 2   celecoxib (CELEBREX) 200 MG capsule TAKE ONE CAPSULE BY MOUTH EVERY MORNING 90 capsule 2   cyclobenzaprine (FLEXERIL) 10 MG tablet Take 10 mg by mouth 3 (three) times daily as needed for muscle spasms.     denosumab (PROLIA) 60 MG/ML SOSY injection Inject 60 mg into the skin every 6 (six) months. 180 mL 2   diazepam (VALIUM) 5 MG tablet Take 1 tablet (5 mg total) by mouth 3 (three) times daily as needed for anxiety. 90 tablet 3   diclofenac Sodium (VOLTAREN) 1 % GEL Apply 2 g topically 4 (four) times daily. (Patient taking differently: Apply 2 g topically daily as needed (Pain).) 50 g 4   diphenoxylate-atropine (LOMOTIL) 2.5-0.025 MG tablet Take 1 tablet by mouth 4 (four) times daily as needed for diarrhea or loose stools. 30 tablet 0   ENTRESTO 24-26 MG TAKE ONE TABLET BY MOUTH TWICE DAILY 180 tablet 2  EPINEPHRINE 0.3 mg/0.3 mL IJ SOAJ injection Inject 0.3 mLs (0.3 mg total) into the muscle as needed for anaphylaxis. 1 each 2   Erenumab-aooe (AIMOVIG) 140 MG/ML SOAJ inject 140mg  into THE SKIN every 28 DAYS 1 mL 5   esomeprazole (NEXIUM) 20 MG capsule TAKE ONE CAPSULE BY MOUTH ONCE DAILY (Patient taking differently: Take 20 mg by mouth daily.) 90 capsule 2   fluticasone (FLONASE) 50 MCG/ACT nasal spray Place 2 sprays into both nostrils daily as needed (congestion). (Patient taking differently: Place 2 sprays into both nostrils daily as needed for allergies (congestion).) 18 mL 2   furosemide (LASIX) 40 MG tablet TAKE ONE TABLET BY MOUTH EVERY MORNING MAY take 0.5 tablet in THE afternoon IF needed] (Patient taking differently: Take 20 mg by mouth 2 (two) times daily.) 90 tablet 2   gabapentin (NEURONTIN) 800 MG tablet Take 1  tablet (800 mg total) by mouth 3 (three) times daily. Patient usually takes twice a day (Patient taking differently: Take 800 mg by mouth 3 (three) times daily.) 270 tablet 2   ipratropium-albuterol (DUONEB) 0.5-2.5 (3) MG/3ML SOLN Inhale 3 mLs into the lungs every 6 (six) hours as needed (sob). 270 mL 3   levocetirizine (XYZAL) 5 MG tablet Take 5 mg by mouth daily as needed for allergies.     megestrol (MEGACE) 20 MG tablet TAKE ONE TABLET BY MOUTH ONCE DAILY 30 tablet 3   Multiple Vitamin (MULTIVITAMIN WITH MINERALS) TABS tablet Take 1 tablet by mouth daily.     naloxegol oxalate (MOVANTIK) 12.5 MG TABS tablet Take 1 tablet (12.5 mg total) by mouth daily. 30 tablet 5   naloxone (NARCAN) nasal spray 4 mg/0.1 mL Place 1 spray into the nose once.     nitroGLYCERIN (NITROSTAT) 0.4 MG SL tablet Place 1 tablet (0.4 mg total) under the tongue every 5 (five) minutes as needed for chest pain. 25 tablet 6   ondansetron (ZOFRAN) 4 MG tablet Take 1 tablet (4 mg total) by mouth every 8 (eight) hours as needed for nausea or vomiting. 20 tablet 0   oxyCODONE (ROXICODONE) 15 MG immediate release tablet Take 15 mg by mouth 3 (three) times daily as needed.     ranolazine (RANEXA) 500 MG 12 hr tablet TAKE 1 TABLET(500 MG) BY MOUTH TWICE DAILY 180 tablet 3   rosuvastatin (CRESTOR) 20 MG tablet TAKE ONE TABLET BY MOUTH EVERY MORNING (Patient taking differently: Take 20 mg by mouth daily.) 90 tablet 2   sertraline (ZOLOFT) 50 MG tablet TAKE ONE TABLET BY MOUTH ONCE DAILY 90 tablet 2   tiZANidine (ZANAFLEX) 4 MG tablet Take 4 mg by mouth at bedtime.     topiramate (TOPAMAX) 50 MG tablet Take 2 tablets (100 mg total) by mouth at bedtime. 180 tablet 2   Ubrogepant (UBRELVY) 100 MG TABS Take 100 mg by mouth as needed (May repeat after 2 hours.  Maximum 2 tablets in 24 hours). TAKE 1 TABLET BY MOUTH BY MOUTH AS NEEDED( MAY REPEAT 1 TABLET AFTER 2 HOURS IF NEEDED, MAXIMUM 2 TABLETS IN 24 HOURS) Strength: 100 mg 16 tablet 5    No current facility-administered medications for this visit.    Allergies:   Patient has no known allergies.   Social History:  The patient  reports that she quit smoking about 18 months ago. Her smoking use included cigarettes. She has a 15.00 pack-year smoking history. She has never used smokeless tobacco. She reports current alcohol use of about 2.0 standard drinks per week.  She reports that she does not use drugs.   Family History:  The patient's family history includes Alcohol abuse in her father; Cancer in an other family member; Heart attack in her father, mother, and another family member; Heart failure in an other family member; Hypertension in her father and mother.   ROS:  Please see the history of present illness.   Otherwise, review of systems is positive for none.   All other systems are reviewed and negative.   PHYSICAL EXAM: VS:  BP 102/68    Pulse 84    Ht 5' (1.524 m)    Wt 138 lb 3.2 oz (62.7 kg)    SpO2 98%    BMI 26.99 kg/m  , BMI Body mass index is 26.99 kg/m. GEN: Well nourished, well developed, in no acute distress  HEENT: normal  Neck: no JVD, carotid bruits, or masses Cardiac: RRR; no murmurs, rubs, or gallops,no edema  Respiratory:  clear to auscultation bilaterally, normal work of breathing GI: soft, nontender, nondistended, + BS MS: no deformity or atrophy  Skin: warm and dry, device site well healed Neuro:  Strength and sensation are intact Psych: euthymic mood, full affect  EKG:  EKG is ordered today. Personal review of the ekg ordered shows sinus rhythm, nonspecific T wave abnormalities, rate 84  Personal review of the device interrogation today. Results in [place]: 10/18/2020: B Natriuretic Peptide 193.4 11/09/2020: Magnesium 2.0 02/15/2021: TSH 0.816 10/02/2021: ALT 8; BUN 25; Creatinine, Ser 1.42; Hemoglobin 9.4; NT-Pro BNP 1,261; Platelets 246; Potassium 4.2; Sodium 144    Lipid Panel     Component Value Date/Time   CHOL 176  10/02/2021 1042   TRIG 145 10/02/2021 1042   HDL 58 10/02/2021 1042   CHOLHDL 3.0 10/02/2021 1042   LDLCALC 93 10/02/2021 1042     Wt Readings from Last 3 Encounters:  10/16/21 138 lb 3.2 oz (62.7 kg)  10/15/21 130 lb (59 kg)  10/02/21 133 lb (60.3 kg)      Other studies Reviewed: Additional studies/ records that were reviewed today include: TTE 11/09/20  Review of the above records today demonstrates:   1. Left ventricular ejection fraction, by estimation, is 30 to 35%. The  left ventricle has severely decreased function. The left ventricle  demonstrates global hypokinesis. There is moderate concentric left  ventricular hypertrophy. Left ventricular  diastolic parameters are consistent with Grade I diastolic dysfunction  (impaired relaxation).   2. Right ventricular systolic function is mildly reduced. The right  ventricular size is normal. There is normal pulmonary artery systolic  pressure.   3. The mitral valve is normal in structure. Mild to moderate mitral valve  regurgitation. No evidence of mitral stenosis.   4. The aortic valve is tricuspid. Aortic valve regurgitation is not  visualized. No aortic stenosis is present.   5. The inferior vena cava is normal in size with greater than 50%  respiratory variability, suggesting right atrial pressure of 3 mmHg.   Cardiac monitor 03/13/2021 personally reviewed Patient had a min HR of 59 bpm, max HR of 119 bpm, and avg HR of 75 bpm. Predominant underlying rhythm was Possible Atrial Pacing. Isolated SVEs were rare (<1.0%), SVE Couplets were rare (<1.0%), and no SVE Triplets were present. Isolated VEs were  frequent (5.6%, 56076), VE Couplets were occasional (1.0%, 5113), and VE Triplets were rare (<1.0%, 42). Ventricular Bigeminy and Trigeminy were present  ASSESSMENT AND PLAN:  1.  Chronic systolic heart failure due to  ischemic cardiomyopathy: Currently on optimal medical therapy.  Is status post Medtronic ICD with generator  change 05/21/2019.  Device functioning appropriately.  No changes at this time.  2.  Hypertension: Currently well controlled  3.  Hyperlipidemia: Continue statin per primary care  4.  Coronary artery disease: Has chronic stable angina.  She did have to take nitroglycerin approximately 1 month ago.  She took 3 doses.  I have told her that if this occurs again, she Kamilo Och need to call the cardiology office or go to the emergency room.  5.  PVCs: 5.6% burden on monitor.  Continue with current management.  Low burden and asymptomatic.   Current medicines are reviewed at length with the patient today.   The patient does not have concerns regarding her medicines.  The following changes were made today: None  Labs/ tests ordered today include:  Orders Placed This Encounter  Procedures   EKG 12-Lead    Disposition:   FU with Roselinda Bahena 12 months  Signed, Airis Barbee Meredith Leeds, MD  10/16/2021 12:04 PM     [facility] [address] [place] [address] [phone] (office) [phone] (fax)

## 2021-10-17 ENCOUNTER — Ambulatory Visit: Payer: 59 | Admitting: Cardiology

## 2021-10-18 ENCOUNTER — Other Ambulatory Visit: Payer: Self-pay

## 2021-10-18 DIAGNOSIS — F321 Major depressive disorder, single episode, moderate: Secondary | ICD-10-CM

## 2021-10-18 DIAGNOSIS — E440 Moderate protein-calorie malnutrition: Secondary | ICD-10-CM

## 2021-10-18 DIAGNOSIS — I5022 Chronic systolic (congestive) heart failure: Secondary | ICD-10-CM

## 2021-10-18 MED ORDER — ARIPIPRAZOLE 2 MG PO TABS: 2.0000 mg | ORAL_TABLET | Freq: Every day | ORAL | 2 refills | Status: DC

## 2021-10-18 MED ORDER — MEGESTROL ACETATE 20 MG PO TABS: 20.0000 mg | ORAL_TABLET | Freq: Every day | ORAL | 2 refills | Status: DC

## 2021-10-18 MED ORDER — FUROSEMIDE 20 MG PO TABS: 20.0000 mg | ORAL_TABLET | Freq: Two times a day (BID) | ORAL | 2 refills | Status: DC

## 2021-10-18 MED ORDER — SERTRALINE HCL 50 MG PO TABS: 50.0000 mg | ORAL_TABLET | Freq: Every day | ORAL | 2 refills | Status: DC

## 2021-10-18 NOTE — Telephone Encounter (Signed)
Compliant on meds, wanted Ranolazine and Topiramate added (Was able to contact her after 3 calls)

## 2021-10-21 ENCOUNTER — Other Ambulatory Visit: Payer: Self-pay | Admitting: Neurology

## 2021-10-22 ENCOUNTER — Other Ambulatory Visit: Payer: Self-pay | Admitting: Neurology

## 2021-10-24 ENCOUNTER — Other Ambulatory Visit: Payer: Self-pay

## 2021-10-24 ENCOUNTER — Ambulatory Visit
Admission: RE | Admit: 2021-10-24 | Discharge: 2021-10-24 | Disposition: A | Discharge status: Home / Self Care | Payer: 59 | Source: Ambulatory Visit | Attending: Legal Medicine | Admitting: Legal Medicine

## 2021-10-25 NOTE — Progress Notes (Signed)
Normal mammogram ?lp

## 2021-10-26 ENCOUNTER — Telehealth: Payer: Self-pay

## 2021-10-26 NOTE — Telephone Encounter (Signed)
New message   [name] Key: [id number] - PA Case ID: [id number] Need help? Call us at [phone]  Outcome Additional Information Required This medication or product was previously approved on [id number] from 2021-08-26 to 2022-08-25. **Please note: This request was submitted electronically. Formulary lowering, tiering exception, cost reduction and/or pre-benefit determination review (including prospective Medicare hospice reviews) requests cannot be requested using this method of submission. Providers contact us at [phone] for further assistance.  Drug Roselyn Meier 100MG  tablets Form [employer] Medicare Part D Electronic Prior Authorization Form (2017 [place]CP[place])

## 2021-11-14 ENCOUNTER — Other Ambulatory Visit: Payer: Self-pay | Admitting: Neurology

## 2021-11-27 ENCOUNTER — Encounter: Payer: Self-pay | Admitting: Cardiology

## 2021-11-27 ENCOUNTER — Ambulatory Visit (INDEPENDENT_AMBULATORY_CARE_PROVIDER_SITE_OTHER): Payer: 59 | Admitting: Cardiology

## 2021-11-27 VITALS — BP 90/62 | HR 72 | Ht 60.0 in | Wt 149.8 lb

## 2021-11-27 DIAGNOSIS — Z95810 Presence of automatic (implantable) cardiac defibrillator: Secondary | ICD-10-CM | POA: Diagnosis not present

## 2021-11-27 DIAGNOSIS — I420 Dilated cardiomyopathy: Secondary | ICD-10-CM

## 2021-11-27 DIAGNOSIS — E785 Hyperlipidemia, unspecified: Secondary | ICD-10-CM

## 2021-11-27 DIAGNOSIS — I5022 Chronic systolic (congestive) heart failure: Secondary | ICD-10-CM

## 2021-11-27 MED ORDER — NITROGLYCERIN 0.4 MG SL SUBL: 0.4000 mg | SUBLINGUAL_TABLET | SUBLINGUAL | 6 refills | Status: DC | PRN

## 2021-11-27 NOTE — Patient Instructions (Signed)
Medication Instructions:  Your physician recommends that you continue on your current medications as directed. Please refer to the Current Medication list given to you today.  *If you need a refill on your cardiac medications before your next appointment, please call your pharmacy*   Lab Work: None If you have labs (blood work) drawn today and your tests are completely normal, you will receive your results only by: MyChart Message (if you have MyChart) OR A paper copy in the mail If you have any lab test that is abnormal or we need to change your treatment, we will call you to review the results.   Testing/Procedures: None   Follow-Up: At [facility], you and your health needs are our priority.  As part of our continuing mission to provide you with exceptional heart care, we have created designated Provider Care Teams.  These Care Teams include your primary Cardiologist (physician) and Advanced Practice Providers (APPs -  Physician Assistants and Nurse Practitioners) who all work together to provide you with the care you need, when you need it.  We recommend signing up for the patient portal called "MyChart".  Sign up information is provided on this After Visit Summary.  MyChart is used to connect with patients for Virtual Visits (Telemedicine).  Patients are able to view lab/test results, encounter notes, upcoming appointments, etc.  Non-urgent messages can be sent to your provider as well.   To learn more about what you can do with MyChart, go to [email or web address].    Your next appointment:   5 month(s)  The format for your next appointment:   In Person  Provider:   Robert Krasowski, MD{    Other Instructions    

## 2021-11-27 NOTE — Progress Notes (Signed)
?Cardiology Office Note:   ? ?Date:  11/27/2021  ? ?ID:  [name], DOB Dec 19, 1955, MRN [id number] ? ?PCP:  Lillard Anes, MD  ?Cardiologist:  Jenne Campus, MD   ? ?Referring MD: Lillard Anes,*  ? ?Chief Complaint  ?Patient presents with  ? Follow-up  ?   ?  ? ? ?History of Present Illness:   ? ?[name] is a 66 y.o. female  with past medical history significant for cardiomyopathy ejection fraction 35% last estimation in April 2021 showing ejection fraction 35 to 40%, BiV ICD, essential hypertension, fibromyalgia, last echocardiogram done in March 2022 showed ejection fraction of 35% ?She comes today 2 months of follow-up.  Overall she seems to be happy and she says she is feeling fine.  She denies have any chest pain tightness squeezing pressure burning chest.  She does what she wants to do.  Described to have couple months ago 1 episode of chest pain with right side of her chest for which she took nitroglycerin with relief.  Since that time there is no more problems. ? ?Past Medical History:  ?Diagnosis Date  ? Abdominal pain 10/17/2020  ? Abnormality of gait due to impairment of balance 11/22/2020  ? Acute diverticulitis 10/17/2020  ? Admission for long-term opiate analgesic use 10/24/2019  ? Arthritis of right hip 05/29/2016  ? Formatting of this note might be different from the original. Added automatically from request for surgery [phone]  ? BMI 26.0-26.9,adult 03/29/2020  ? Bronchial asthma   ? Cardiomyopathy ([place])   ? Overview:  Ejection fraction 45% in 2015 Ejection fraction 30 to 35% in November 2018  ? Chronic back pain   ? Chronic constipation 07/31/2021  ? Chronic hypoxemic respiratory failure ([place]) 10/24/2019  ? Chronic narcotic use 03/23/2015  ? Chronic pain of both knees 12/21/2018  ? Added automatically from request for surgery [phone]  Formatting of this note might be different from the original. Added automatically from request for surgery [phone]  ? Chronic systolic  congestive heart failure, NYHA class 2 ([place]) 06/19/2017  ? 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 COPD (chronic obstructive pulmonary disease) ([place])   ? Coronary artery disease involving native coronary artery of native heart without angina pectoris 05/31/2015  ? Overview:  Abnormal stress test in fall of 2016, cardiac catheterization showed normal coronaries.  ? Cystitis 05/17/2020  ? Dehydration 05/17/2020  ? Diarrhea 05/17/2020  ? Dilated cardiomyopathy ([place]) 06/19/2017  ? Diverticulitis   ? Diverticulosis   ? 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 Dyslipidemia 05/31/2015  ? GERD (gastroesophageal reflux disease)   ? 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[place]) 07/13/2020  ? Hypotension 05/17/2020  ? 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  ? Ileus following gastrointestinal surgery ([place]) 03/26/2015  ? Major depressive disorder, single episode, moderate ([place]) 10/24/2019  ? Malnutrition of moderate degree ([place]) 10/20/2020  ? Mesenteric ischemia ([place]) 10/16/2020  ? Migraine without aura with status migrainosus 10/24/2019  ? Mixed incontinence 10/20/2020  ? Myocardial infarction [facility])   ? Nausea & vomiting   ? Obstructive chronic bronchitis with exacerbation ([place]) 10/25/2019  ? Other spondylosis with radiculopathy, lumbar region 10/24/2019  ? Persistent vomiting 05/17/2020  ? Presence of left artificial hip joint 10/24/2019  ? PTSD (post-traumatic stress disorder)   ? 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 03/23/2015 04/19/2014  ? Senile osteoporosis 10/24/2019  ? Sepsis ([place]) 10/17/2020  ? 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 Small bowel obstruction ([place])   ? Thyroid disease   ? Upper respiratory tract infection due to COVID-19 virus 07/06/2021  ? Wellness examination 03/27/2021  ? ? ?Past Surgical History:  ?Procedure Laterality Date  ? APPENDECTOMY    ? CARDIAC CATHETERIZATION    ? CARPAL TUNNEL  RELEASE    ? CESAREAN SECTION    ? CHF s/p AICD   12/2010  ? CHOLECYSTECTOMY    ? COLONOSCOPY  10/16/2015  ? Mild colonic diverticulosis, predominantly in the left colon. Small internal hemrrhoids. Otherwise normal colonoscopy  ? COLONOSCOPY WITH PROPOFOL N/A 09/27/2021  ? Procedure: COLONOSCOPY WITH PROPOFOL;  Surgeon: Mauri Pole, MD;  Location: [facility];  Service: Endoscopy;  Laterality: N/A;  ? CORONARY ANGIOPLASTY    ? ESOPHAGOGASTRODUODENOSCOPY  02/04/2014  ? Mild gastritis. Otherwise normal EGD  ? HAND SURGERY    ? ICD GENERATOR CHANGEOUT N/A 05/21/2019  ? Procedure: ICD GENERATOR CHANGEOUT;  Surgeon: Constance Haw, MD;  Location: [facility];  Service: Cardiovascular;  Laterality: N/A;  ? ICD IMPLANT    ? [employer]  ? intestinal blockage 2011    ? LAPAROSCOPIC ASSISTED VENTRAL HERNIA REPAIR N/A 03/23/2015  ? Procedure: LAPAROSCOPIC VENTRAL WALL HERNIA REPAIR;  Surgeon: Michael Boston, MD;  Location: [facility] ORS;  Service: General;  Laterality: N/A;  With MESH  ? LAPAROSCOPIC LYSIS OF ADHESIONS N/A 03/23/2015  ? Procedure: LAPAROSCOPIC LYSIS OF ADHESIONS;  Surgeon: Michael Boston, MD;  Location: [facility];  Service: General;  Laterality: N/A;  ? LSCS     ? x2  ? NASAL SEPTUM SURGERY    ? NECK SURGERY    ? fused  ? TONSILLECTOMY    ? ULNAR NERVE TRANSPOSITION  01/23/2012  ? Procedure: ULNAR NERVE DECOMPRESSION/TRANSPOSITION;  Surgeon: Ophelia Charter, MD;  Location: [facility];  Service: Neurosurgery;  Laterality: Left;  LEFT ulnar nerve decompression  ? ? ?Current Medications: ?Current Meds  ?Medication Sig  ? AIMOVIG 140 MG/ML SOAJ inject '140mg'$  into THE SKIN every 28 DAYS (Patient taking differently: Inject 140 mg into the muscle every 28 (twenty-eight) days. inject '140mg'$  into THE SKIN every 28 DAYS)  ? ARIPiprazole (ABILIFY) 2 MG tablet Take 1 tablet (2 mg total) by mouth daily.  ? aspirin 81 MG tablet Take 81 mg by mouth daily.  ? BREO ELLIPTA 100-25 MCG/ACT AEPB INHALE 1 PUFF BY MOUTH INTO  LUNGS DAILY in THE afternoon (Patient taking differently: 1 puff 2 (two) times daily.)  ? Buprenorphine HCl (BELBUCA) 900 MCG FILM Place 900 mcg inside cheek every 12 (twelve) hours.  ? carvedilol (COREG) 3.125 MG tablet Take 1 tablet (3.125 mg total) by mouth 2 (two) times daily with a meal.  ? celecoxib (CELEBREX) 200 MG capsule TAKE ONE CAPSULE BY MOUTH EVERY MORNING (Patient taking differently: Take 200 mg by mouth daily.)  ? cyclobenzaprine (FLEXERIL) 10 MG tablet Take 10 mg by mouth 3 (three) times daily as needed for muscle spasms.  ? denosumab (PROLIA) 60 MG/ML SOSY injection Inject 60 mg into the skin every 6 (six) months.  ? diazepam (VALIUM) 5 MG tablet Take 1 tablet (5 mg total) by mouth 3 (three) times daily as needed for anxiety.  ? diclofenac Sodium (VOLTAREN) 1 % GEL Apply 2 g topically 4 (four) times daily. (Patient taking differently: Apply 2 g topically daily as needed (Pain).)  ? ENTRESTO 24-26 MG TAKE ONE TABLET BY MOUTH TWICE DAILY (Patient taking differently: Take 1 tablet by  mouth 2 (two) times daily.)  ? EPINEPHRINE 0.3 mg/0.3 mL IJ SOAJ injection Inject 0.3 mLs (0.3 mg total) into the muscle as needed for anaphylaxis.  ? esomeprazole (NEXIUM) 20 MG capsule TAKE ONE CAPSULE BY MOUTH ONCE DAILY (Patient taking differently: Take 20 mg by mouth daily.)  ? fluticasone (FLONASE) 50 MCG/ACT nasal spray Place 2 sprays into both nostrils daily as needed (congestion). (Patient taking differently: Place 2 sprays into both nostrils daily as needed for allergies (congestion).)  ? furosemide (LASIX) 20 MG tablet Take 1 tablet (20 mg total) by mouth 2 (two) times daily.  ? gabapentin (NEURONTIN) 800 MG tablet Take 1 tablet (800 mg total) by mouth 3 (three) times daily. Patient usually takes twice a day (Patient taking differently: Take 800 mg by mouth 3 (three) times daily.)  ? ipratropium-albuterol (DUONEB) 0.5-2.5 (3) MG/3ML SOLN Inhale 3 mLs into the lungs every 6 (six) hours as needed (sob).  ?  levocetirizine (XYZAL) 5 MG tablet Take 5 mg by mouth daily as needed for allergies.  ? megestrol (MEGACE) 20 MG tablet Take 1 tablet (20 mg total) by mouth daily.  ? Multiple Vitamin (MULTIVITAMIN WITH MINE

## 2021-12-03 ENCOUNTER — Ambulatory Visit (INDEPENDENT_AMBULATORY_CARE_PROVIDER_SITE_OTHER): Payer: 59

## 2021-12-03 DIAGNOSIS — D649 Anemia, unspecified: Secondary | ICD-10-CM

## 2021-12-05 ENCOUNTER — Ambulatory Visit (INDEPENDENT_AMBULATORY_CARE_PROVIDER_SITE_OTHER): Payer: 59

## 2021-12-05 ENCOUNTER — Other Ambulatory Visit: Payer: Self-pay

## 2021-12-05 DIAGNOSIS — I420 Dilated cardiomyopathy: Secondary | ICD-10-CM | POA: Diagnosis not present

## 2021-12-05 DIAGNOSIS — D649 Anemia, unspecified: Secondary | ICD-10-CM

## 2021-12-05 LAB — POC HEMOCCULT BLD/STL (HOME/3-CARD/SCREEN)
Card #2 Fecal Occult Blod, POC: NEGATIVE
Card #3 Fecal Occult Blood, POC: POSITIVE
Fecal Occult Blood, POC: POSITIVE — AB

## 2021-12-06 ENCOUNTER — Ambulatory Visit: Payer: 59 | Admitting: Cardiology

## 2021-12-06 ENCOUNTER — Other Ambulatory Visit: Payer: 59

## 2021-12-06 DIAGNOSIS — D649 Anemia, unspecified: Secondary | ICD-10-CM

## 2021-12-06 LAB — CUP PACEART REMOTE DEVICE CHECK
Battery Remaining Longevity: 102 mo
Battery Voltage: 2.97 V
Brady Statistic AP VP Percent: 0 %
Brady Statistic AP VS Percent: 1.23 %
Brady Statistic AS VP Percent: 0.03 %
Brady Statistic AS VS Percent: 98.73 %
Brady Statistic RA Percent Paced: 1.23 %
Brady Statistic RV Percent Paced: 0.04 %
Date Time Interrogation Session: 20230411184120
HighPow Impedance: 60 Ohm
Implantable Lead Implant Date: 20120502
Implantable Lead Implant Date: 20120502
Implantable Lead Location: 753859
Implantable Lead Location: 753860
Implantable Lead Model: 4076
Implantable Lead Model: 7122
Implantable Pulse Generator Implant Date: 20200925
Lead Channel Impedance Value: 304 Ohm
Lead Channel Impedance Value: 513 Ohm
Lead Channel Impedance Value: 646 Ohm
Lead Channel Pacing Threshold Amplitude: 0.5 V
Lead Channel Pacing Threshold Amplitude: 1.25 V
Lead Channel Pacing Threshold Pulse Width: 0.4 ms
Lead Channel Pacing Threshold Pulse Width: 0.4 ms
Lead Channel Sensing Intrinsic Amplitude: 2.75 mV
Lead Channel Sensing Intrinsic Amplitude: 2.75 mV
Lead Channel Sensing Intrinsic Amplitude: 7.625 mV
Lead Channel Sensing Intrinsic Amplitude: 7.625 mV
Lead Channel Setting Pacing Amplitude: 1.5 V
Lead Channel Setting Pacing Amplitude: 2.5 V
Lead Channel Setting Pacing Pulse Width: 0.4 ms
Lead Channel Setting Sensing Sensitivity: 0.3 mV

## 2021-12-06 LAB — CBC WITH DIFFERENTIAL/PLATELET
Basophils Absolute: 0.1 10*3/uL (ref 0.0–0.2)
Basos: 1 %
EOS (ABSOLUTE): 0.3 10*3/uL (ref 0.0–0.4)
Eos: 3 %
Hematocrit: 25.8 % — ABNORMAL LOW (ref 34.0–46.6)
Hemoglobin: 8.4 g/dL — ABNORMAL LOW (ref 11.1–15.9)
Immature Grans (Abs): 0 10*3/uL (ref 0.0–0.1)
Immature Granulocytes: 0 %
Lymphocytes Absolute: 2.7 10*3/uL (ref 0.7–3.1)
Lymphs: 36 %
MCH: 30.2 pg (ref 26.6–33.0)
MCHC: 32.6 g/dL (ref 31.5–35.7)
MCV: 93 fL (ref 79–97)
Monocytes Absolute: 0.5 10*3/uL (ref 0.1–0.9)
Monocytes: 7 %
Neutrophils Absolute: 3.8 10*3/uL (ref 1.4–7.0)
Neutrophils: 53 %
Platelets: 321 10*3/uL (ref 150–450)
RBC: 2.78 x10E6/uL — ABNORMAL LOW (ref 3.77–5.28)
RDW: 12.1 % (ref 11.7–15.4)
WBC: 7.3 10*3/uL (ref 3.4–10.8)

## 2021-12-07 NOTE — Progress Notes (Signed)
Anemia hemoglobin 8.4,  ?lp

## 2021-12-10 ENCOUNTER — Other Ambulatory Visit: Payer: Self-pay

## 2021-12-10 ENCOUNTER — Other Ambulatory Visit: Payer: 59

## 2021-12-10 DIAGNOSIS — D649 Anemia, unspecified: Secondary | ICD-10-CM

## 2021-12-10 NOTE — Progress Notes (Signed)
Blood count fell another unit of blood, need further GI workup, add reticulocyste count.  Hemocults positive ?lp

## 2021-12-11 ENCOUNTER — Telehealth: Payer: Self-pay

## 2021-12-11 NOTE — Telephone Encounter (Signed)
I am sorry no we do not have any thing any sooner.  She can always call and see if we have any cancellations.  [phone] ?

## 2021-12-11 NOTE — Telephone Encounter (Signed)
-----   Message from Loralie Champagne, PA-C sent at 12/11/2021  9:13 AM EDT ----- ?Please schedule with Dr. Silverio Decamp next available.  She hasn't been seen in months and PCP forwarded Hgb and Hemoccult results to me.  If she needs an EGD it would need to be at the hospital and right now that time is about non-existent so I prefer to let Margarette Asal sort it out. ? ?----- Message ----- ?From: Thompson Caul I, CMA ?Sent: 12/10/2021   3:05 PM EDT ?To: Loralie Champagne, PA-C ? ?Please review. Hb went down one point.  ? ?

## 2021-12-11 NOTE — Telephone Encounter (Signed)
[name] we have the pt scheduled for an office visit on 02/12/22 with Dr Silverio Decamp at 8:50 am.  Will you let the pt know?  I will also send her a My Chart message and mail the information.   ?

## 2021-12-12 ENCOUNTER — Other Ambulatory Visit: Payer: Self-pay

## 2021-12-12 DIAGNOSIS — D649 Anemia, unspecified: Secondary | ICD-10-CM

## 2021-12-12 NOTE — Telephone Encounter (Signed)
The pt has been rescheduled to [facility] on 5/4 at 9 am.  Left message on machine to call back  ?

## 2021-12-12 NOTE — Telephone Encounter (Signed)
I left message on voicemail to call us back. 

## 2021-12-12 NOTE — Telephone Encounter (Signed)
May 5 at 10 am ?? ?

## 2021-12-12 NOTE — Telephone Encounter (Signed)
I changed it to 12/28/21 at 10 am.  If that does not work for her have her call [phone] and the schedulers can help her reschedule.   ?

## 2021-12-12 NOTE — Telephone Encounter (Signed)
[name] Dr Reinaldo Meeker would like to speak with you regarding this pt.  He would like you to call [phone].   ?

## 2021-12-14 ENCOUNTER — Other Ambulatory Visit: Payer: Self-pay | Admitting: Legal Medicine

## 2021-12-14 DIAGNOSIS — F321 Major depressive disorder, single episode, moderate: Secondary | ICD-10-CM

## 2021-12-17 ENCOUNTER — Other Ambulatory Visit: Payer: 59

## 2021-12-17 DIAGNOSIS — D649 Anemia, unspecified: Secondary | ICD-10-CM

## 2021-12-17 LAB — RETICULOCYTES

## 2021-12-17 LAB — SPECIMEN STATUS REPORT

## 2021-12-18 LAB — CBC WITH DIFFERENTIAL/PLATELET
Basophils Absolute: 0.1 10*3/uL (ref 0.0–0.2)
Basos: 1 %
EOS (ABSOLUTE): 0.3 10*3/uL (ref 0.0–0.4)
Eos: 4 %
Hematocrit: 28.1 % — ABNORMAL LOW (ref 34.0–46.6)
Hemoglobin: 9.4 g/dL — ABNORMAL LOW (ref 11.1–15.9)
Immature Grans (Abs): 0 10*3/uL (ref 0.0–0.1)
Immature Granulocytes: 0 %
Lymphocytes Absolute: 2 10*3/uL (ref 0.7–3.1)
Lymphs: 21 %
MCH: 30.7 pg (ref 26.6–33.0)
MCHC: 33.5 g/dL (ref 31.5–35.7)
MCV: 92 fL (ref 79–97)
Monocytes Absolute: 0.7 10*3/uL (ref 0.1–0.9)
Monocytes: 8 %
Neutrophils Absolute: 6.4 10*3/uL (ref 1.4–7.0)
Neutrophils: 66 %
Platelets: 312 10*3/uL (ref 150–450)
RBC: 3.06 x10E6/uL — ABNORMAL LOW (ref 3.77–5.28)
RDW: 11.9 % (ref 11.7–15.4)
WBC: 9.5 10*3/uL (ref 3.4–10.8)

## 2021-12-18 LAB — RETICULOCYTES: Retic Ct Pct: 1.4 % (ref 0.6–2.6)

## 2021-12-18 NOTE — Progress Notes (Signed)
Hemoglobin up now good,  ?lp

## 2021-12-21 NOTE — Progress Notes (Signed)
Remote ICD transmission.   

## 2021-12-27 ENCOUNTER — Ambulatory Visit: Payer: 59 | Admitting: Nurse Practitioner

## 2021-12-28 ENCOUNTER — Ambulatory Visit: Payer: 59 | Admitting: Nurse Practitioner

## 2022-01-09 ENCOUNTER — Ambulatory Visit: Payer: 59 | Admitting: Legal Medicine

## 2022-01-09 ENCOUNTER — Ambulatory Visit: Payer: 59 | Admitting: Nurse Practitioner

## 2022-01-15 ENCOUNTER — Telehealth: Payer: Self-pay

## 2022-01-15 NOTE — Progress Notes (Signed)
Chronic Care Management Pharmacy Assistant   Name: [name]  MRN: [id number] DOB: 05/05/1956   Reason for Encounter: Medication Coordination for [facility]    Recent office visits:  None  Recent consult visits:  11/27/21 (Cardiology) Jenne Campus MD. Seen for Dilated Cardiomyopathy. No med changes.   10/16/21 (Cardiology) Meredith Leeds MD. Seen for CHF. No med changes.   Hospital visits:  None  Medications: Outpatient Encounter Medications as of 01/15/2022  Medication Sig   AIMOVIG 140 MG/ML SOAJ inject '140mg'$  into THE SKIN every 28 DAYS (Patient taking differently: Inject 140 mg into the muscle every 28 (twenty-eight) days. inject '140mg'$  into THE SKIN every 28 DAYS)   ARIPiprazole (ABILIFY) 2 MG tablet Take 1 tablet (2 mg total) by mouth daily.   aspirin 81 MG tablet Take 81 mg by mouth daily.   BREO ELLIPTA 100-25 MCG/ACT AEPB INHALE 1 PUFF BY MOUTH INTO LUNGS DAILY in THE afternoon (Patient taking differently: 1 puff 2 (two) times daily.)   Buprenorphine HCl (BELBUCA) 900 MCG FILM Place 900 mcg inside cheek every 12 (twelve) hours.   carvedilol (COREG) 3.125 MG tablet Take 1 tablet (3.125 mg total) by mouth 2 (two) times daily with a meal.   celecoxib (CELEBREX) 200 MG capsule TAKE ONE CAPSULE BY MOUTH EVERY MORNING (Patient taking differently: Take 200 mg by mouth daily.)   cyclobenzaprine (FLEXERIL) 10 MG tablet Take 10 mg by mouth 3 (three) times daily as needed for muscle spasms.   denosumab (PROLIA) 60 MG/ML SOSY injection Inject 60 mg into the skin every 6 (six) months.   diazepam (VALIUM) 5 MG tablet Take 1 tablet (5 mg total) by mouth 3 (three) times daily as needed for anxiety.   diclofenac Sodium (VOLTAREN) 1 % GEL Apply 2 g topically 4 (four) times daily. (Patient taking differently: Apply 2 g topically daily as needed (Pain).)   ENTRESTO 24-26 MG TAKE ONE TABLET BY MOUTH TWICE DAILY (Patient taking differently: Take 1 tablet by mouth 2 (two) times daily.)    EPINEPHRINE 0.3 mg/0.3 mL IJ SOAJ injection Inject 0.3 mLs (0.3 mg total) into the muscle as needed for anaphylaxis.   esomeprazole (NEXIUM) 20 MG capsule TAKE ONE CAPSULE BY MOUTH ONCE DAILY (Patient taking differently: Take 20 mg by mouth daily.)   fluticasone (FLONASE) 50 MCG/ACT nasal spray Place 2 sprays into both nostrils daily as needed (congestion). (Patient taking differently: Place 2 sprays into both nostrils daily as needed for allergies (congestion).)   furosemide (LASIX) 20 MG tablet Take 1 tablet (20 mg total) by mouth 2 (two) times daily.   gabapentin (NEURONTIN) 800 MG tablet Take 1 tablet (800 mg total) by mouth 3 (three) times daily. Patient usually takes twice a day (Patient taking differently: Take 800 mg by mouth 3 (three) times daily.)   ipratropium-albuterol (DUONEB) 0.5-2.5 (3) MG/3ML SOLN Inhale 3 mLs into the lungs every 6 (six) hours as needed (sob).   levocetirizine (XYZAL) 5 MG tablet Take 5 mg by mouth daily as needed for allergies.   megestrol (MEGACE) 20 MG tablet Take 1 tablet (20 mg total) by mouth daily.   Multiple Vitamin (MULTIVITAMIN WITH MINERALS) TABS tablet Take 1 tablet by mouth daily.   naloxegol oxalate (MOVANTIK) 12.5 MG TABS tablet Take 1 tablet (12.5 mg total) by mouth daily.   naloxone (NARCAN) nasal spray 4 mg/0.1 mL Place 1 spray into the nose once.   nitroGLYCERIN (NITROSTAT) 0.4 MG SL tablet Place 1 tablet (0.4 mg total) under  the tongue every 5 (five) minutes as needed for chest pain.   ondansetron (ZOFRAN) 4 MG tablet Take 1 tablet (4 mg total) by mouth every 8 (eight) hours as needed for nausea or vomiting.   oxyCODONE (ROXICODONE) 15 MG immediate release tablet Take 15 mg by mouth 3 (three) times daily as needed.   ranolazine (RANEXA) 500 MG 12 hr tablet TAKE 1 TABLET(500 MG) BY MOUTH TWICE DAILY (Patient taking differently: Take 500 mg by mouth 2 (two) times daily. TAKE 1 TABLET(500 MG) BY MOUTH TWICE DAILY)   rosuvastatin (CRESTOR) 20 MG tablet  TAKE ONE TABLET BY MOUTH EVERY MORNING (Patient taking differently: Take 20 mg by mouth daily.)   sertraline (ZOLOFT) 50 MG tablet TAKE 1 TABLET BY MOUTH DAILY   tiZANidine (ZANAFLEX) 4 MG tablet Take 4 mg by mouth at bedtime.   topiramate (TOPAMAX) 50 MG tablet Take 2 tablets (100 mg total) by mouth at bedtime.   Ubrogepant (UBRELVY) 100 MG TABS Take 100 mg by mouth as needed (May repeat after 2 hours.  Maximum 2 tablets in 24 hours). TAKE 1 TABLET BY MOUTH BY MOUTH AS NEEDED( MAY REPEAT 1 TABLET AFTER 2 HOURS IF NEEDED, MAXIMUM 2 TABLETS IN 24 HOURS) Strength: 100 mg   No facility-administered encounter medications on file as of 01/15/2022.    Reviewed chart for medication changes ahead of medication coordination call.  No OVs, or hospital visits since last care coordination call/Pharmacist visit.  No medication changes indicated OR if recent visit, treatment plan here.  BP Readings from Last 3 Encounters:  11/27/21 90/62  10/16/21 102/68  10/02/21 108/62    No results found for: HGBA1C   Patient obtains medications through Vials  90 Days   Last adherence delivery included: Aimovig '140mg'$ -inject '140mg'$  into the skin once a month Breo ellipta 100-25 mg 1 puff daily Megestrol 20 mg daily Abilify 2 mg daily Lasix 40 mg 1 tablet every morning and half tablet in afternoon if needed Esomeprazole '20mg'$ - 1 capsule once daily    Entresto 24-26 mg twice daily Celebrex 200 mg daily Rosuvastatin 20 mg daily Carvedilol 3.'125mg'$  1 tab twice daily  Diazepam 5 mg 3 times daily as needed Sertraline 50 mg 1 tablet daily    Patient declined (meds) last delivery Unable to contact pt  Patient is due for next adherence delivery on: 01/25/22. Called patient and reviewed medications and coordinated delivery.  This delivery to include: Ranolazine ER '500mg'$ - 1 tab twice daily  Aripiprazole '2mg'$ - 1 tab once daily  Linzess 269mg- 1 capsule before breakfast  Aimovig '140mg'$ -inject '140mg'$  into the skin  once a month- Being Sent on 01/16/22 Breo ellipta 100-25 mg 1 puff daily Megestrol 20 mg daily Topiramate '50mg'$ - 2 tabs everyday at bedtime  Lasix 20 mg -1 tab twice daily  Esomeprazole '20mg'$ - 1 capsule once daily    Entresto 24-26 mg twice daily Celebrex 200 mg daily Rosuvastatin 20 mg daily Carvedilol 3.'125mg'$  1 tab twice daily  Diazepam 5 mg 3 times daily as needed  Unable to contact pt to complete this call. Pharmacy will coordinate delivery.    DElray Mcgregor CLanarePharmacist Assistant  [phone]

## 2022-01-18 ENCOUNTER — Other Ambulatory Visit: Payer: Self-pay | Admitting: Legal Medicine

## 2022-01-18 DIAGNOSIS — F5101 Primary insomnia: Secondary | ICD-10-CM

## 2022-02-12 ENCOUNTER — Ambulatory Visit: Payer: 59 | Admitting: Gastroenterology

## 2022-02-15 ENCOUNTER — Other Ambulatory Visit: Payer: Self-pay | Admitting: Legal Medicine

## 2022-03-06 ENCOUNTER — Ambulatory Visit (INDEPENDENT_AMBULATORY_CARE_PROVIDER_SITE_OTHER): Payer: 59

## 2022-03-06 DIAGNOSIS — I420 Dilated cardiomyopathy: Secondary | ICD-10-CM

## 2022-03-06 LAB — CUP PACEART REMOTE DEVICE CHECK
Battery Remaining Longevity: 100 mo
Battery Voltage: 2.96 V
Brady Statistic AP VP Percent: 0.01 %
Brady Statistic AP VS Percent: 5.84 %
Brady Statistic AS VP Percent: 0.03 %
Brady Statistic AS VS Percent: 94.12 %
Brady Statistic RA Percent Paced: 5.81 %
Brady Statistic RV Percent Paced: 0.04 %
Date Time Interrogation Session: 20230712012405
HighPow Impedance: 69 Ohm
Implantable Lead Implant Date: 20120502
Implantable Lead Implant Date: 20120502
Implantable Lead Location: 753859
Implantable Lead Location: 753860
Implantable Lead Model: 4076
Implantable Lead Model: 7122
Implantable Pulse Generator Implant Date: 20200925
Lead Channel Impedance Value: 361 Ohm
Lead Channel Impedance Value: 551 Ohm
Lead Channel Impedance Value: 703 Ohm
Lead Channel Pacing Threshold Amplitude: 0.5 V
Lead Channel Pacing Threshold Amplitude: 1.125 V
Lead Channel Pacing Threshold Pulse Width: 0.4 ms
Lead Channel Pacing Threshold Pulse Width: 0.4 ms
Lead Channel Sensing Intrinsic Amplitude: 2.125 mV
Lead Channel Sensing Intrinsic Amplitude: 2.125 mV
Lead Channel Sensing Intrinsic Amplitude: 9.625 mV
Lead Channel Sensing Intrinsic Amplitude: 9.625 mV
Lead Channel Setting Pacing Amplitude: 1.5 V
Lead Channel Setting Pacing Amplitude: 2.25 V
Lead Channel Setting Pacing Pulse Width: 0.4 ms
Lead Channel Setting Sensing Sensitivity: 0.3 mV

## 2022-03-20 ENCOUNTER — Ambulatory Visit (INDEPENDENT_AMBULATORY_CARE_PROVIDER_SITE_OTHER): Payer: 59 | Admitting: Legal Medicine

## 2022-03-20 ENCOUNTER — Encounter: Payer: Self-pay | Admitting: Legal Medicine

## 2022-03-20 VITALS — BP 80/54 | HR 77 | Temp 98.3°F | Resp 15 | Ht 60.0 in | Wt 151.0 lb

## 2022-03-20 DIAGNOSIS — E782 Mixed hyperlipidemia: Secondary | ICD-10-CM

## 2022-03-20 DIAGNOSIS — Z6826 Body mass index (BMI) 26.0-26.9, adult: Secondary | ICD-10-CM

## 2022-03-20 DIAGNOSIS — M810 Age-related osteoporosis without current pathological fracture: Secondary | ICD-10-CM

## 2022-03-20 DIAGNOSIS — Z95810 Presence of automatic (implantable) cardiac defibrillator: Secondary | ICD-10-CM

## 2022-03-20 DIAGNOSIS — K2100 Gastro-esophageal reflux disease with esophagitis, without bleeding: Secondary | ICD-10-CM | POA: Diagnosis not present

## 2022-03-20 DIAGNOSIS — J9611 Chronic respiratory failure with hypoxia: Secondary | ICD-10-CM | POA: Diagnosis not present

## 2022-03-20 DIAGNOSIS — N3946 Mixed incontinence: Secondary | ICD-10-CM | POA: Diagnosis not present

## 2022-03-20 DIAGNOSIS — I2510 Atherosclerotic heart disease of native coronary artery without angina pectoris: Secondary | ICD-10-CM | POA: Diagnosis not present

## 2022-03-20 DIAGNOSIS — F321 Major depressive disorder, single episode, moderate: Secondary | ICD-10-CM

## 2022-03-20 DIAGNOSIS — I5022 Chronic systolic (congestive) heart failure: Secondary | ICD-10-CM

## 2022-03-20 DIAGNOSIS — K559 Vascular disorder of intestine, unspecified: Secondary | ICD-10-CM

## 2022-03-20 DIAGNOSIS — J449 Chronic obstructive pulmonary disease, unspecified: Secondary | ICD-10-CM

## 2022-03-20 NOTE — Patient Instructions (Signed)
Shoulder Exercises Ask your health care provider which exercises are safe for you. Do exercises exactly as told by your health care provider and adjust them as directed. It is normal to feel mild stretching, pulling, tightness, or discomfort as you do these exercises. Stop right away if you feel sudden pain or your pain gets worse. Do not begin these exercises until told by your health care provider. Stretching exercises External rotation and abduction This exercise is sometimes called corner stretch. This exercise rotates your arm outward (external rotation) and moves your arm out from your body (abduction). Stand in a doorway with one of your feet slightly in front of the other. This is called a staggered stance. If you cannot reach your forearms to the door frame, stand facing a corner of a room. Choose one of the following positions as told by your health care provider: Place your hands and forearms on the door frame above your head. Place your hands and forearms on the door frame at the height of your head. Place your hands on the door frame at the height of your elbows. Slowly move your weight onto your front foot until you feel a stretch across your chest and in the front of your shoulders. Keep your head and chest upright and keep your abdominal muscles tight. Hold for __________ seconds. To release the stretch, shift your weight to your back foot. Repeat __________ times. Complete this exercise __________ times a day. Extension, standing Stand and hold a broomstick, a cane, or a similar object behind your back. Your hands should be a little wider than shoulder width apart. Your palms should face away from your back. Keeping your elbows straight and your shoulder muscles relaxed, move the stick away from your body until you feel a stretch in your shoulders (extension). Avoid shrugging your shoulders while you move the stick. Keep your shoulder blades tucked down toward the middle of your  back. Hold for __________ seconds. Slowly return to the starting position. Repeat __________ times. Complete this exercise __________ times a day. Range-of-motion exercises Pendulum  Stand near a wall or a surface that you can hold onto for balance. Bend at the waist and let your left / right arm hang straight down. Use your other arm to support you. Keep your back straight and do not lock your knees. Relax your left / right arm and shoulder muscles, and move your hips and your trunk so your left / right arm swings freely. Your arm should swing because of the motion of your body, not because you are using your arm or shoulder muscles. Keep moving your hips and trunk so your arm swings in the following directions, as told by your health care provider: Side to side. Forward and backward. In clockwise and counterclockwise circles. Continue each motion for __________ seconds, or for as long as told by your health care provider. Slowly return to the starting position. Repeat __________ times. Complete this exercise __________ times a day. Shoulder flexion, standing  Stand and hold a broomstick, a cane, or a similar object. Place your hands a little more than shoulder width apart on the object. Your left / right hand should be palm up, and your other hand should be palm down. Keep your elbow straight and your shoulder muscles relaxed. Push the stick up with your healthy arm to raise your left / right arm in front of your body, and then over your head until you feel a stretch in your shoulder (flexion). Avoid   shrugging your shoulder while you raise your arm. Keep your shoulder blade tucked down toward the middle of your back. Hold for __________ seconds. Slowly return to the starting position. Repeat __________ times. Complete this exercise __________ times a day. Shoulder abduction, standing Stand and hold a broomstick, a cane, or a similar object. Place your hands a little more than shoulder  width apart on the object. Your left / right hand should be palm up, and your other hand should be palm down. Keep your elbow straight and your shoulder muscles relaxed. Push the object across your body toward your left / right side. Raise your left / right arm to the side of your body (abduction) until you feel a stretch in your shoulder. Do not raise your arm above shoulder height unless your health care provider tells you to do that. If directed, raise your arm over your head. Avoid shrugging your shoulder while you raise your arm. Keep your shoulder blade tucked down toward the middle of your back. Hold for __________ seconds. Slowly return to the starting position. Repeat __________ times. Complete this exercise __________ times a day. Internal rotation  Place your left / right hand behind your back, palm up. Use your other hand to dangle an exercise band, a towel, or a similar object over your shoulder. Grasp the band with your left / right hand so you are holding on to both ends. Gently pull up on the band until you feel a stretch in the front of your left / right shoulder. The movement of your arm toward the center of your body is called internal rotation. Avoid shrugging your shoulder while you raise your arm. Keep your shoulder blade tucked down toward the middle of your back. Hold for __________ seconds. Release the stretch by letting go of the band and lowering your hands. Repeat __________ times. Complete this exercise __________ times a day. Strengthening exercises External rotation  Sit in a stable chair without armrests. Secure an exercise band to a stable object at elbow height on your left / right side. Place a soft object, such as a folded towel or a small pillow, between your left / right upper arm and your body to move your elbow about 4 inches (10 cm) away from your side. Hold the end of the exercise band so it is tight and there is no slack. Keeping your elbow pressed  against the soft object, slowly move your forearm out, away from your abdomen (external rotation). Keep your body steady so only your forearm moves. Hold for __________ seconds. Slowly return to the starting position. Repeat __________ times. Complete this exercise __________ times a day. Shoulder abduction  Sit in a stable chair without armrests, or stand up. Hold a __________ weight in your left / right hand, or hold an exercise band with both hands. Start with your arms straight down and your left / right palm facing in, toward your body. Slowly lift your left / right hand out to your side (abduction). Do not lift your hand above shoulder height unless your health care provider tells you that this is safe. Keep your arms straight. Avoid shrugging your shoulder while you do this movement. Keep your shoulder blade tucked down toward the middle of your back. Hold for __________ seconds. Slowly lower your arm, and return to the starting position. Repeat __________ times. Complete this exercise __________ times a day. Shoulder extension Sit in a stable chair without armrests, or stand up. Secure an exercise band   to a stable object in front of you so it is at shoulder height. Hold one end of the exercise band in each hand. Your palms should face each other. Straighten your elbows and lift your hands up to shoulder height. Step back, away from the secured end of the exercise band, until the band is tight and there is no slack. Squeeze your shoulder blades together as you pull your hands down to the sides of your thighs (extension). Stop when your hands are straight down by your sides. Do not let your hands go behind your body. Hold for __________ seconds. Slowly return to the starting position. Repeat __________ times. Complete this exercise __________ times a day. Shoulder row Sit in a stable chair without armrests, or stand up. Secure an exercise band to a stable object in front of you so it  is at waist height. Hold one end of the exercise band in each hand. Position your palms so that your thumbs are facing the ceiling (neutral position). Bend each of your elbows to a 90-degree angle (right angle) and keep your upper arms at your sides. Step back until the band is tight and there is no slack. Slowly pull your elbows back behind you. Hold for __________ seconds. Slowly return to the starting position. Repeat __________ times. Complete this exercise __________ times a day. Shoulder press-ups  Sit in a stable chair that has armrests. Sit upright, with your feet flat on the floor. Put your hands on the armrests so your elbows are bent and your fingers are pointing forward. Your hands should be about even with the sides of your body. Push down on the armrests and use your arms to lift yourself off the chair. Straighten your elbows and lift yourself up as much as you comfortably can. Move your shoulder blades down, and avoid letting your shoulders move up toward your ears. Keep your feet on the ground. As you get stronger, your feet should support less of your body weight as you lift yourself up. Hold for __________ seconds. Slowly lower yourself back into the chair. Repeat __________ times. Complete this exercise __________ times a day. Wall push-ups  Stand so you are facing a stable wall. Your feet should be about one arm-length away from the wall. Lean forward and place your palms on the wall at shoulder height. Keep your feet flat on the floor as you bend your elbows and lean forward toward the wall. Hold for __________ seconds. Straighten your elbows to push yourself back to the starting position. Repeat __________ times. Complete this exercise __________ times a day. This information is not intended to replace advice given to you by your health care provider. Make sure you discuss any questions you have with your health care provider. Document Revised: 08/07/2021 Document  Reviewed: 09/11/2018 [employer] Patient Education  [place].

## 2022-03-20 NOTE — Progress Notes (Signed)
Subjective:  Patient ID: [name], female    DOB: [birth year]  Age: 66 y.o. MRN: [id number]  Chief Complaint  Patient presents with   Congestive Heart Failure   Gastroesophageal Reflux   Hyperlipidemia    HPI: chronic CHF: Patient is taking Entresto 24-26 mg twice a day, Carvedilol 3.'125mg'$  twice a day. Gained one lb. No DOE or PND, no chest pain  Depression: She is taking Sertraline 50 mg daily.  Patient presents with hyperlipidemia.  Compliance with treatment has been good; patient takes medicines as directed, maintains low cholesterol diet, follows up as directed, and maintains exercise regimen.  Patient is using Rosuvastatin 20 mg daily without problems.  CHF- stable at present stage 2, no CHF symptoms  Chronic pain in shoulder where she fell one month ago.    Current Outpatient Medications on File Prior to Visit  Medication Sig Dispense Refill   AIMOVIG 140 MG/ML SOAJ inject '140mg'$  into THE SKIN every 28 DAYS (Patient taking differently: Inject 140 mg into the muscle every 28 (twenty-eight) days. inject '140mg'$  into THE SKIN every 28 DAYS) 1 mL 5   ARIPiprazole (ABILIFY) 2 MG tablet Take 1 tablet (2 mg total) by mouth daily. 90 tablet 2   aspirin 81 MG tablet Take 81 mg by mouth daily.     BREO ELLIPTA 100-25 MCG/ACT AEPB INHALE 1 PUFF BY MOUTH INTO LUNGS DAILY in THE afternoon (Patient taking differently: 1 puff 2 (two) times daily.) 180 each 2   Buprenorphine HCl (BELBUCA) 900 MCG FILM Place 900 mcg inside cheek every 12 (twelve) hours.     carvedilol (COREG) 3.125 MG tablet Take 1 tablet (3.125 mg total) by mouth 2 (two) times daily with a meal. 180 tablet 2   celecoxib (CELEBREX) 200 MG capsule TAKE ONE CAPSULE BY MOUTH EVERY MORNING (Patient taking differently: Take 200 mg by mouth daily.) 90 capsule 2   cyclobenzaprine (FLEXERIL) 10 MG tablet Take 10 mg by mouth 3 (three) times daily as needed for muscle spasms.     denosumab (PROLIA) 60 MG/ML SOSY injection Inject 60 mg  into the skin every 6 (six) months. 180 mL 2   diazepam (VALIUM) 5 MG tablet TAKE ONE TABLET BY MOUTH THREE TIMES DAILY AS NEEDED FOR ANXIETY 90 tablet 3   diclofenac Sodium (VOLTAREN) 1 % GEL Apply 2 g topically 4 (four) times daily. (Patient taking differently: Apply 2 g topically daily as needed (Pain).) 50 g 4   ENTRESTO 24-26 MG TAKE ONE TABLET BY MOUTH TWICE DAILY (Patient taking differently: Take 1 tablet by mouth 2 (two) times daily.) 180 tablet 2   EPINEPHRINE 0.3 mg/0.3 mL IJ SOAJ injection Inject 0.3 mLs (0.3 mg total) into the muscle as needed for anaphylaxis. 1 each 2   esomeprazole (NEXIUM) 20 MG capsule TAKE ONE CAPSULE BY MOUTH ONCE DAILY (Patient taking differently: Take 20 mg by mouth daily.) 90 capsule 2   fluticasone (FLONASE) 50 MCG/ACT nasal spray Place 2 sprays into both nostrils daily as needed (congestion). (Patient taking differently: Place 2 sprays into both nostrils daily as needed for allergies (congestion).) 18 mL 2   furosemide (LASIX) 20 MG tablet Take 1 tablet (20 mg total) by mouth 2 (two) times daily. 180 tablet 2   gabapentin (NEURONTIN) 800 MG tablet Take 1 tablet (800 mg total) by mouth 3 (three) times daily. Patient usually takes twice a day (Patient taking differently: Take 800 mg by mouth 3 (three) times daily.) 270 tablet 2  ipratropium-albuterol (DUONEB) 0.5-2.5 (3) MG/3ML SOLN Use 1 vial in nebulizer every 6 (six) hours as needed for shortness of breath 3 mL 11   levocetirizine (XYZAL) 5 MG tablet Take 5 mg by mouth daily as needed for allergies.     megestrol (MEGACE) 20 MG tablet Take 1 tablet (20 mg total) by mouth daily. 90 tablet 2   Multiple Vitamin (MULTIVITAMIN WITH MINERALS) TABS tablet Take 1 tablet by mouth daily.     naloxegol oxalate (MOVANTIK) 12.5 MG TABS tablet Take 1 tablet (12.5 mg total) by mouth daily. 30 tablet 5   naloxone (NARCAN) nasal spray 4 mg/0.1 mL Place 1 spray into the nose once.     nitroGLYCERIN (NITROSTAT) 0.4 MG SL tablet  Place 1 tablet (0.4 mg total) under the tongue every 5 (five) minutes as needed for chest pain. 25 tablet 6   ondansetron (ZOFRAN) 4 MG tablet Take 1 tablet (4 mg total) by mouth every 8 (eight) hours as needed for nausea or vomiting. 20 tablet 0   oxyCODONE (ROXICODONE) 15 MG immediate release tablet Take 15 mg by mouth 3 (three) times daily as needed.     ranolazine (RANEXA) 500 MG 12 hr tablet TAKE 1 TABLET(500 MG) BY MOUTH TWICE DAILY (Patient taking differently: Take 500 mg by mouth 2 (two) times daily. TAKE 1 TABLET(500 MG) BY MOUTH TWICE DAILY) 180 tablet 3   rosuvastatin (CRESTOR) 20 MG tablet TAKE ONE TABLET BY MOUTH EVERY MORNING (Patient taking differently: Take 20 mg by mouth daily.) 90 tablet 2   sertraline (ZOLOFT) 50 MG tablet TAKE 1 TABLET BY MOUTH DAILY 90 tablet 2   tiZANidine (ZANAFLEX) 4 MG tablet Take 4 mg by mouth at bedtime.     topiramate (TOPAMAX) 50 MG tablet Take 2 tablets (100 mg total) by mouth at bedtime. 180 tablet 2   Ubrogepant (UBRELVY) 100 MG TABS Take 100 mg by mouth as needed (May repeat after 2 hours.  Maximum 2 tablets in 24 hours). TAKE 1 TABLET BY MOUTH BY MOUTH AS NEEDED( MAY REPEAT 1 TABLET AFTER 2 HOURS IF NEEDED, MAXIMUM 2 TABLETS IN 24 HOURS) Strength: 100 mg 16 tablet 5   No current facility-administered medications on file prior to visit.   Past Medical History:  Diagnosis Date   Abdominal pain 10/17/2020   Abnormality of gait due to impairment of balance 11/22/2020   Acute diverticulitis 10/17/2020   Admission for long-term opiate analgesic use 10/24/2019   Arthritis of right hip 05/29/2016   Formatting of this note might be different from the original. Added automatically from request for surgery [id number]   BMI 26.0-26.9,adult 03/29/2020   Bronchial asthma    Cardiomyopathy ([place])    Overview:  Ejection fraction 45% in 2015 Ejection fraction 30 to 35% in November 2018   Chronic back pain    Chronic constipation 07/31/2021   Chronic hypoxemic  respiratory failure ([place]) 10/24/2019   Chronic narcotic use 03/23/2015   Chronic pain of both knees 12/21/2018   Added automatically from request for surgery [id number]  Formatting of this note might be different from the original. 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[place]) 07/13/2020   Hypotension 05/17/2020   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   Ileus following gastrointestinal surgery ([place]) 03/26/2015   Major depressive disorder, single episode, moderate ([place]) 10/24/2019   Malnutrition of moderate degree ([place]) 10/20/2020   Mesenteric ischemia ([place]) 10/16/2020   Migraine without aura with status migrainosus 10/24/2019   Mixed incontinence 10/20/2020   Myocardial infarction (HCC)    Nausea & vomiting    Obstructive chronic bronchitis with exacerbation ([place]) 10/25/2019   Other spondylosis with radiculopathy, lumbar region 10/24/2019   Persistent vomiting 05/17/2020   Presence of left artificial hip joint 10/24/2019   PTSD (post-traumatic stress disorder)    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 03/23/2015 04/19/2014   Senile osteoporosis 10/24/2019   Sepsis ([place]) 10/17/2020   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 Small internal hemrrhoids. Otherwise normal colonoscopy   COLONOSCOPY WITH PROPOFOL N/A 09/27/2021   Procedure: COLONOSCOPY WITH PROPOFOL;  Surgeon: Mauri Pole, MD;  Location: [facility];  Service: Endoscopy;  Laterality: N/A;   CORONARY ANGIOPLASTY     ESOPHAGOGASTRODUODENOSCOPY  02/04/2014   Mild gastritis. Otherwise normal EGD   HAND SURGERY     ICD GENERATOR CHANGEOUT N/A 05/21/2019   Procedure: ICD GENERATOR CHANGEOUT;  Surgeon: Constance Haw, MD;  Location: [facility];  Service: Cardiovascular;  Laterality: N/A;   ICD IMPLANT     Medtronic   intestinal blockage 2011     LAPAROSCOPIC ASSISTED VENTRAL HERNIA REPAIR N/A 03/23/2015   Procedure: LAPAROSCOPIC VENTRAL WALL HERNIA REPAIR;  Surgeon: Michael Boston, MD;  Location: [facility];  Service: General;  Laterality: N/A;  With MESH   LAPAROSCOPIC LYSIS OF ADHESIONS N/A 03/23/2015   Procedure: LAPAROSCOPIC LYSIS OF ADHESIONS;  Surgeon: Michael Boston, MD;  Location: [facility];  Service: General;  Laterality: N/A;   LSCS      x2   NASAL SEPTUM SURGERY     NECK SURGERY     fused   TONSILLECTOMY     ULNAR NERVE TRANSPOSITION  01/23/2012   Procedure: ULNAR NERVE DECOMPRESSION/TRANSPOSITION;  Surgeon: Ophelia Charter, MD;  Location: [facility];  Service: Neurosurgery;  Laterality: Left;  LEFT ulnar nerve decompression    Family History  Problem Relation Age of Onset   Hypertension Mother    Heart attack Mother    Alcohol abuse Father    Hypertension  Father    Heart attack Father    Heart attack Other    Cancer Other    Heart failure Other    Anesthesia problems Neg Hx    Hypotension Neg Hx    Malignant hyperthermia Neg Hx  Pseudochol deficiency Neg Hx    Breast cancer Neg Hx    Social History   Socioeconomic History   Marital status: Single    Spouse name: Not on file   Number of children: 3   Years of education: Not on file   Highest education level: Not on file  Occupational History   Occupation: disabled  Tobacco Use   Smoking status: Former    Packs/day: 0.50    Years: 30.00    Total pack years: 15.00    Types: Cigarettes    Quit date: 03/2020    Years since quitting: 1.9   Smokeless tobacco: Never  Vaping Use   Vaping Use: Never used  Substance and Sexual Activity   Alcohol use: Yes    Alcohol/week: 2.0 standard drinks of alcohol    Types: 1 Cans of beer, 1 Standard drinks or equivalent per week    Comment: seldom   Drug use: No   Sexual activity: Not Currently  Other Topics Concern   Not on file  Social History Narrative   One level home with boyfriend   Caffeine - coffee 1-2 cups/day; Green tea 4-5 bottles a day   Exercise - some    Right handed      Social Determinants of Health   Financial Resource Strain: Low Risk  (03/27/2021)   Overall Financial Resource Strain (CARDIA)    Difficulty of Paying Living Expenses: Not hard at all  Food Insecurity: No Food Insecurity (10/31/2020)   Hunger Vital Sign    Worried About Running Out of Food in the Last Year: Never true    Ran Out of Food in the Last Year: Never true  Transportation Needs: Unmet Transportation Needs (03/27/2021)   PRAPARE - Hydrologist (Medical): Not on file    Lack of Transportation (Non-Medical): Yes  Physical Activity: Inactive (03/27/2021)   Exercise Vital Sign    Days of Exercise per Week: 0 days    Minutes of Exercise per Session: 0 min  Stress: Stress Concern Present (03/27/2021)   [place]    Feeling of Stress : Rather much  Social Connections: Moderately Integrated (03/27/2021)   Social Connection and Isolation Panel [NHANES]    Frequency of Communication with Friends and Family: More than three times a week    Frequency of Social Gatherings with Friends and Family: Never    Attends Religious Services: 1 to 4 times per year    Active Member of [employer] or Organizations: No    Attends Archivist Meetings: Never    Marital Status: Married    Review of Systems  Constitutional:  Negative for chills, fatigue and fever.  HENT:  Negative for congestion, ear pain and sore throat.   Eyes:  Negative for visual disturbance.  Respiratory:  Positive for shortness of breath. Negative for cough.   Cardiovascular:  Positive for leg swelling. Negative for chest pain and palpitations.  Gastrointestinal:  Negative for abdominal pain, constipation, diarrhea, nausea and vomiting.  Endocrine: Negative for polydipsia, polyphagia and polyuria.  Genitourinary:  Negative for difficulty urinating and dysuria.  Musculoskeletal:  Negative for arthralgias, back pain and myalgias.  Skin:  Negative for rash.  Neurological:  Negative for headaches.  Psychiatric/Behavioral:  Negative for dysphoric mood. The patient is not nervous/anxious.      Objective:  BP (!) 80/54   Pulse 77   Temp 98.3 F (36.8 C)  Resp 15   Ht 5' (1.524 m)   Wt 151 lb (68.5 kg)   SpO2 98%   BMI 29.49 kg/m      03/20/2022   10:34 AM 11/27/2021   11:43 AM 10/16/2021   11:55 AM  BP/Weight  Systolic BP 80 90 174  Diastolic BP 54 62 68  Wt. (Lbs) 151 149.8 138.2  BMI 29.49 kg/m2 29.26 kg/m2 26.99 kg/m2    Physical Exam Vitals reviewed.  Constitutional:      General: She is not in acute distress.    Appearance: Normal appearance.  HENT:     Head: Normocephalic.     Right Ear: Tympanic membrane normal.     Left Ear: Tympanic membrane normal.      Mouth/Throat:     Mouth: Mucous membranes are moist.     Pharynx: Oropharynx is clear.  Eyes:     Extraocular Movements: Extraocular movements intact.     Conjunctiva/sclera: Conjunctivae normal.     Pupils: Pupils are equal, round, and reactive to light.  Cardiovascular:     Rate and Rhythm: Normal rate and regular rhythm.     Pulses: Normal pulses.     Heart sounds: Normal heart sounds. No murmur heard.    No gallop.  Pulmonary:     Effort: Pulmonary effort is normal. No respiratory distress.     Breath sounds: Normal breath sounds. No wheezing.  Abdominal:     General: Abdomen is flat. Bowel sounds are normal. There is no distension.     Tenderness: There is no abdominal tenderness.  Musculoskeletal:     Cervical back: Normal range of motion and neck supple.     Comments: Right shoulder pain. Abduct shoulder 60 degrees, flexion 60 degrees. Rotation 30 degrees  Skin:    General: Skin is warm.     Capillary Refill: Capillary refill takes less than 2 seconds.  Neurological:     General: No focal deficit present.     Mental Status: She is alert and oriented to person, place, and time. Mental status is at baseline.     Gait: Gait normal.     Deep Tendon Reflexes: Reflexes normal.  Psychiatric:        Mood and Affect: Mood normal.        Thought Content: Thought content normal.        03/20/2022   10:47 AM 06/26/2021    3:50 PM 05/22/2021   10:18 AM 03/27/2021    1:45 PM 02/15/2021   10:33 AM  Depression screen PHQ 2/9  Decreased Interest 0 '2 1 2 2  '$ Down, Depressed, Hopeless 1 1 0 2 3  PHQ - 2 Score '1 3 1 4 5  '$ Altered sleeping '1 3 3 3 3  '$ Tired, decreased energy 2 0 '3 3 2  '$ Change in appetite '2 3 3 3   '$ Feeling bad or failure about yourself  2 1 0 0 2  Trouble concentrating 1 0 0 3 3  Moving slowly or fidgety/restless 0 0 0 2 2  Suicidal thoughts 0 1 0 0 0  PHQ-9 Score '9 11 10 18 17  '$ Difficult doing work/chores Not difficult at all Somewhat difficult Not difficult at all  Very difficult Very difficult       Lab Results  Component Value Date   WBC 7.6 03/20/2022   HGB 10.3 (L) 03/20/2022   HCT 31.1 (L) 03/20/2022   PLT 230 03/20/2022   GLUCOSE 110 (H) 03/20/2022   CHOL  154 03/20/2022   TRIG 220 (H) 03/20/2022   HDL 52 03/20/2022   LDLCALC 66 03/20/2022   ALT 7 03/20/2022   AST 16 03/20/2022   NA 143 03/20/2022   K 4.7 03/20/2022   CL 104 03/20/2022   CREATININE 2.05 (H) 03/20/2022   BUN 26 03/20/2022   CO2 19 (L) 03/20/2022   TSH 0.816 02/15/2021   INR 1.0 10/18/2020      Assessment & Plan:   Problem List Items Addressed This Visit       Cardiovascular and Mediastinum   Chronic systolic congestive heart failure, NYHA class 2 ([place])   Relevant Orders   CBC with Differential/Platelet (Completed) An individualized care plan was established and reinforced.  The patient's disease status was assessed using clinical finding son exam today, labs, and/or other diagnostic testing such as x-rays, to determine the patient's success in meeting treatmentgoalsbased on disease-based guidelines and found to be improving. But not at goal yet. On entresto Medications prescriptions no changes Laboratory tests ordered to be performed today include routine. RECOMMENDATIONS: given include see cardiology.  Call physician is patient gains 3 lbs in one day or 5 lbs for one week.  Call for progressive PND, orthopnea or increased pedal edema.     Coronary artery disease involving native coronary artery of native heart without angina pectoris - Primary   Relevant Orders   Comprehensive metabolic panel (Completed) An individual plan was formulated based on patient history and exam, labs and evidence based data. Patient has not had recent angina or nitroglycerin use. continue present treatment.     Mesenteric ischemia [facility]) Patient has stent placed     Respiratory   COPD (chronic obstructive pulmonary disease) ([place]) An individualize plan was formulated for care  of COPD.  Treatment is evidence based.  She will continue on inhalers, avoid smoking and smoke.  Regular exercise with help with dyspnea. Routine follow ups and medication compliance is needed.     Chronic hypoxemic respiratory failure (HCC) Patient uses O2 PRN now     Digestive   GERD (gastroesophageal reflux disease) Plan of care was formulated today.  She is doing well.  A plan of care was formulated using patient exam, tests and other sources to optimize care using evidence based information.  Recommend no smoking, no eating after supper, avoid fatty foods, elevate Head of bed, avoid tight fitting clothing.  Continue on nexium.      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  The patient's status was assessed using clinical findings on exam, lab and other diagnostic tests. The patient's disease status was assessed based on evidence-based guidelines and found to be fair controlled. MEDICATIONS were reviewed. SELF MANAGEMENT GOALS have been discussed and patient's success at attaining the goal of low cholesterol was assessed. RECOMMENDATION given include regular exercise 3 days a week and low cholesterol/low fat diet. CLINICAL SUMMARY including written plan to identify barriers unique to the  patient due to social or economic  reasons was discussed.      .    Orders Placed This Encounter  Procedures   CBC with Differential/Platelet   Comprehensive  metabolic panel   Lipid panel   Cardiovascular Risk Assessment     Follow-up: Return in about 3 months (around 06/20/2022).  An After Visit Summary was printed and given to the patient.  Reinaldo Meeker, MD [facility] [phone]

## 2022-03-21 LAB — CBC WITH DIFFERENTIAL/PLATELET
Basophils Absolute: 0.1 10*3/uL (ref 0.0–0.2)
Basos: 1 %
EOS (ABSOLUTE): 0.3 10*3/uL (ref 0.0–0.4)
Eos: 4 %
Hematocrit: 31.1 % — ABNORMAL LOW (ref 34.0–46.6)
Hemoglobin: 10.3 g/dL — ABNORMAL LOW (ref 11.1–15.9)
Immature Grans (Abs): 0 10*3/uL (ref 0.0–0.1)
Immature Granulocytes: 0 %
Lymphocytes Absolute: 1.5 10*3/uL (ref 0.7–3.1)
Lymphs: 19 %
MCH: 30.7 pg (ref 26.6–33.0)
MCHC: 33.1 g/dL (ref 31.5–35.7)
MCV: 93 fL (ref 79–97)
Monocytes Absolute: 0.6 10*3/uL (ref 0.1–0.9)
Monocytes: 7 %
Neutrophils Absolute: 5.2 10*3/uL (ref 1.4–7.0)
Neutrophils: 69 %
Platelets: 230 10*3/uL (ref 150–450)
RBC: 3.36 x10E6/uL — ABNORMAL LOW (ref 3.77–5.28)
RDW: 12.2 % (ref 11.7–15.4)
WBC: 7.6 10*3/uL (ref 3.4–10.8)

## 2022-03-21 LAB — LIPID PANEL
Chol/HDL Ratio: 3 ratio (ref 0.0–4.4)
Cholesterol, Total: 154 mg/dL (ref 100–199)
HDL: 52 mg/dL (ref >39)
LDL Chol Calc (NIH): 66 mg/dL (ref 0–99)
Triglycerides: 220 mg/dL — ABNORMAL HIGH (ref 0–149)
VLDL Cholesterol Cal: 36 mg/dL (ref 5–40)

## 2022-03-21 LAB — COMPREHENSIVE METABOLIC PANEL
ALT: 7 IU/L (ref 0–32)
AST: 16 IU/L (ref 0–40)
Albumin/Globulin Ratio: 1.8 (ref 1.2–2.2)
Albumin: 4.4 g/dL (ref 3.9–4.9)
Alkaline Phosphatase: 63 IU/L (ref 44–121)
BUN/Creatinine Ratio: 13 (ref 12–28)
BUN: 26 mg/dL (ref 8–27)
Bilirubin Total: 0.2 mg/dL (ref 0.0–1.2)
CO2: 19 mmol/L — ABNORMAL LOW (ref 20–29)
Calcium: 10 mg/dL (ref 8.7–10.3)
Chloride: 104 mmol/L (ref 96–106)
Creatinine, Ser: 2.05 mg/dL — ABNORMAL HIGH (ref 0.57–1.00)
Globulin, Total: 2.4 g/dL (ref 1.5–4.5)
Glucose: 110 mg/dL — ABNORMAL HIGH (ref 70–99)
Potassium: 4.7 mmol/L (ref 3.5–5.2)
Sodium: 143 mmol/L (ref 134–144)
Total Protein: 6.8 g/dL (ref 6.0–8.5)
eGFR: 26 mL/min/{1.73_m2} — ABNORMAL LOW (ref >59)

## 2022-03-21 LAB — CARDIOVASCULAR RISK ASSESSMENT

## 2022-03-21 NOTE — Progress Notes (Signed)
Glucose 110, kidney tests stage 4, liver tests normal, triglycerides 220 high, chronic anemia, no changes, recheck bmp one month lp

## 2022-03-25 NOTE — Progress Notes (Signed)
Remote ICD transmission.   

## 2022-03-28 ENCOUNTER — Telehealth: Payer: Self-pay

## 2022-03-28 NOTE — Telephone Encounter (Signed)
Return patient call, left message for patient to call office back.

## 2022-04-02 NOTE — Progress Notes (Signed)
Refer to nephrology lp

## 2022-04-13 ENCOUNTER — Other Ambulatory Visit: Payer: Self-pay | Admitting: Neurology

## 2022-04-15 ENCOUNTER — Telehealth: Payer: Self-pay

## 2022-04-15 NOTE — Progress Notes (Signed)
Chronic Care Management Pharmacy Assistant   Name: [name]  MRN: [id number] DOB: 07/31/1956   Reason for Encounter: Medication Coordination for [facility]    Recent office visits:  03/20/22 Reinaldo Meeker MD. Seen for CAD. Referral to Nephrology. No med changes.   Recent consult visits:  None  Hospital visits:  None  Medications: Outpatient Encounter Medications as of 04/15/2022  Medication Sig   AIMOVIG 140 MG/ML SOAJ inject '140mg'$  into THE SKIN every 28 DAYS (Patient taking differently: Inject 140 mg into the muscle every 28 (twenty-eight) days. inject '140mg'$  into THE SKIN every 28 DAYS)   ARIPiprazole (ABILIFY) 2 MG tablet Take 1 tablet (2 mg total) by mouth daily.   aspirin 81 MG tablet Take 81 mg by mouth daily.   BREO ELLIPTA 100-25 MCG/ACT AEPB INHALE 1 PUFF BY MOUTH INTO LUNGS DAILY in THE afternoon (Patient taking differently: 1 puff 2 (two) times daily.)   Buprenorphine HCl (BELBUCA) 900 MCG FILM Place 900 mcg inside cheek every 12 (twelve) hours.   carvedilol (COREG) 3.125 MG tablet Take 1 tablet (3.125 mg total) by mouth 2 (two) times daily with a meal.   celecoxib (CELEBREX) 200 MG capsule TAKE ONE CAPSULE BY MOUTH EVERY MORNING (Patient taking differently: Take 200 mg by mouth daily.)   cyclobenzaprine (FLEXERIL) 10 MG tablet Take 10 mg by mouth 3 (three) times daily as needed for muscle spasms.   denosumab (PROLIA) 60 MG/ML SOSY injection Inject 60 mg into the skin every 6 (six) months.   diazepam (VALIUM) 5 MG tablet TAKE ONE TABLET BY MOUTH THREE TIMES DAILY AS NEEDED FOR ANXIETY   diclofenac Sodium (VOLTAREN) 1 % GEL Apply 2 g topically 4 (four) times daily. (Patient taking differently: Apply 2 g topically daily as needed (Pain).)   ENTRESTO 24-26 MG TAKE ONE TABLET BY MOUTH TWICE DAILY (Patient taking differently: Take 1 tablet by mouth 2 (two) times daily.)   EPINEPHRINE 0.3 mg/0.3 mL IJ SOAJ injection Inject 0.3 mLs (0.3 mg total) into the muscle as needed  for anaphylaxis.   esomeprazole (NEXIUM) 20 MG capsule TAKE ONE CAPSULE BY MOUTH ONCE DAILY (Patient taking differently: Take 20 mg by mouth daily.)   fluticasone (FLONASE) 50 MCG/ACT nasal spray Place 2 sprays into both nostrils daily as needed (congestion). (Patient taking differently: Place 2 sprays into both nostrils daily as needed for allergies (congestion).)   furosemide (LASIX) 20 MG tablet Take 1 tablet (20 mg total) by mouth 2 (two) times daily.   gabapentin (NEURONTIN) 800 MG tablet Take 1 tablet (800 mg total) by mouth 3 (three) times daily. Patient usually takes twice a day (Patient taking differently: Take 800 mg by mouth 3 (three) times daily.)   ipratropium-albuterol (DUONEB) 0.5-2.5 (3) MG/3ML SOLN Use 1 vial in nebulizer every 6 (six) hours as needed for shortness of breath   levocetirizine (XYZAL) 5 MG tablet Take 5 mg by mouth daily as needed for allergies.   megestrol (MEGACE) 20 MG tablet Take 1 tablet (20 mg total) by mouth daily.   Multiple Vitamin (MULTIVITAMIN WITH MINERALS) TABS tablet Take 1 tablet by mouth daily.   naloxegol oxalate (MOVANTIK) 12.5 MG TABS tablet Take 1 tablet (12.5 mg total) by mouth daily.   naloxone (NARCAN) nasal spray 4 mg/0.1 mL Place 1 spray into the nose once.   nitroGLYCERIN (NITROSTAT) 0.4 MG SL tablet Place 1 tablet (0.4 mg total) under the tongue every 5 (five) minutes as needed for chest pain.   ondansetron (  ZOFRAN) 4 MG tablet Take 1 tablet (4 mg total) by mouth every 8 (eight) hours as needed for nausea or vomiting.   oxyCODONE (ROXICODONE) 15 MG immediate release tablet Take 15 mg by mouth 3 (three) times daily as needed.   ranolazine (RANEXA) 500 MG 12 hr tablet TAKE 1 TABLET(500 MG) BY MOUTH TWICE DAILY (Patient taking differently: Take 500 mg by mouth 2 (two) times daily. TAKE 1 TABLET(500 MG) BY MOUTH TWICE DAILY)   rosuvastatin (CRESTOR) 20 MG tablet TAKE ONE TABLET BY MOUTH EVERY MORNING (Patient taking differently: Take 20 mg by  mouth daily.)   sertraline (ZOLOFT) 50 MG tablet TAKE 1 TABLET BY MOUTH DAILY   tiZANidine (ZANAFLEX) 4 MG tablet Take 4 mg by mouth at bedtime.   topiramate (TOPAMAX) 50 MG tablet Take 2 tablets (100 mg total) by mouth at bedtime.   Ubrogepant (UBRELVY) 100 MG TABS Take 100 mg by mouth as needed (May repeat after 2 hours.  Maximum 2 tablets in 24 hours). TAKE 1 TABLET BY MOUTH BY MOUTH AS NEEDED( MAY REPEAT 1 TABLET AFTER 2 HOURS IF NEEDED, MAXIMUM 2 TABLETS IN 24 HOURS) Strength: 100 mg   No facility-administered encounter medications on file as of 04/15/2022.    Reviewed chart for medication changes ahead of medication coordination call.  No Consults, or hospital visits since last care coordination call/Pharmacist visit.   No medication changes indicated OR if recent visit, treatment plan here.  BP Readings from Last 3 Encounters:  03/20/22 (!) 80/54  11/27/21 90/62  10/16/21 102/68    No results found for: "HGBA1C"   Patient obtains medications through Vials  90 Days   Last adherence delivery included:  Ranolazine ER '500mg'$ - 1 tab twice daily        Aripiprazole '2mg'$ - 1 tab once daily      Linzess 222mg- 1 capsule before breakfast  Aimovig '140mg'$ -inject '140mg'$  into the skin once a month- Being Sent on 01/16/22 Breo ellipta 100-25 mg 1 puff daily Megestrol 20 mg daily Topiramate '50mg'$ - 2 tabs everyday at bedtime          Lasix 20 mg -1 tab twice daily             Esomeprazole '20mg'$ - 1 capsule once daily    Entresto 24-26 mg twice daily Celebrex 200 mg daily Rosuvastatin 20 mg daily Carvedilol 3.'125mg'$  1 tab twice daily  Diazepam 5 mg 3 times daily as needed  Patient declined (meds) last delivery Unable to reach pt   Patient is due for next adherence delivery on: 04/25/22. Called patient and reviewed medications and coordinated delivery.  This delivery to include: Ranolazine ER '500mg'$ - 1 tab twice daily        Aripiprazole '2mg'$ - 1 tab once daily      Linzess 2961m- 1 capsule  before breakfast  Breo ellipta 100-25 mg 1 puff daily Megestrol 20 mg daily          Esomeprazole '20mg'$ - 1 capsule once daily    Entresto 24-26 mg twice daily Celebrex 200 mg daily Rosuvastatin 20 mg daily Carvedilol 3.'125mg'$  1 tab twice daily  Diazepam 5 mg 3 times daily as needed Aspirin '81mg'$ - daily  Flonase Nasal Spray-Place 2 sprays into both nostrils daily as needed   Patient declined the following medications Aimovig '140mg'$ - Not due until 05/10/22 Epinephrine 0.'3mg'$ - Has plenty on hand. Not expired   [facility]  Topiramate '50mg'$ - Filled 04/11/22 90ds Furosemide '20mg'$ - Filled  04/09/22 30ds Tizandine '4mg'$ - Filled  04/13/22 30ds Nitrogylcerin 0.'4mg'$ - Filled 11/27/21 30ds Sertraline '50mg'$ -Filled 12/12/21 90ds Uberelvy '100mg'$ - Filled 10/17/21 8ds  Pt wants to continue getting these two meds at [facility]  [facility]  [facility]  Patient needs refills -Request Sent  Carvedilol 3.'125mg'$  Linzess 2982m Entresto 24-26 mg  Celebrex 200 mg  Topiramate '50mg'$   Confirmed delivery date of 04/25/22, advised patient that pharmacy will contact them the morning of delivery.   DaElray McgregorCMLexingtonharmacist Assistant  [phone]

## 2022-04-16 ENCOUNTER — Other Ambulatory Visit: Payer: Self-pay

## 2022-04-16 DIAGNOSIS — M4726 Other spondylosis with radiculopathy, lumbar region: Secondary | ICD-10-CM

## 2022-04-16 DIAGNOSIS — I5022 Chronic systolic (congestive) heart failure: Secondary | ICD-10-CM

## 2022-04-16 MED ORDER — ENTRESTO 24-26 MG PO TABS: 1.0000 | ORAL_TABLET | Freq: Two times a day (BID) | ORAL | 2 refills | Status: DC

## 2022-04-16 MED ORDER — TOPIRAMATE 50 MG PO TABS: ORAL_TABLET | ORAL | 0 refills | Status: DC

## 2022-04-16 MED ORDER — CELECOXIB 200 MG PO CAPS: 200.0000 mg | ORAL_CAPSULE | Freq: Every morning | ORAL | 2 refills | Status: DC

## 2022-04-16 MED ORDER — FUROSEMIDE 20 MG PO TABS: 20.0000 mg | ORAL_TABLET | Freq: Two times a day (BID) | ORAL | 1 refills | Status: DC

## 2022-04-16 MED ORDER — CARVEDILOL 3.125 MG PO TABS: 3.1250 mg | ORAL_TABLET | Freq: Two times a day (BID) | ORAL | 2 refills | Status: DC

## 2022-04-16 MED ORDER — FLUTICASONE PROPIONATE 50 MCG/ACT NA SUSP: 2.0000 | Freq: Every day | NASAL | 2 refills | Status: DC | PRN

## 2022-04-22 ENCOUNTER — Other Ambulatory Visit: Payer: 59

## 2022-04-22 ENCOUNTER — Other Ambulatory Visit: Payer: Self-pay | Admitting: Legal Medicine

## 2022-04-22 DIAGNOSIS — K5909 Other constipation: Secondary | ICD-10-CM

## 2022-04-22 DIAGNOSIS — E785 Hyperlipidemia, unspecified: Secondary | ICD-10-CM

## 2022-05-06 ENCOUNTER — Other Ambulatory Visit: Payer: Self-pay | Admitting: Neurology

## 2022-05-06 NOTE — Telephone Encounter (Signed)
App 07/10/2022

## 2022-05-07 ENCOUNTER — Encounter: Payer: Self-pay | Admitting: Cardiology

## 2022-05-07 ENCOUNTER — Ambulatory Visit: Payer: 59 | Attending: Cardiology | Admitting: Cardiology

## 2022-05-07 ENCOUNTER — Other Ambulatory Visit: Payer: Self-pay | Admitting: Legal Medicine

## 2022-05-07 VITALS — BP 112/78 | HR 76 | Ht 60.0 in | Wt 148.0 lb

## 2022-05-07 DIAGNOSIS — E785 Hyperlipidemia, unspecified: Secondary | ICD-10-CM

## 2022-05-07 DIAGNOSIS — I2510 Atherosclerotic heart disease of native coronary artery without angina pectoris: Secondary | ICD-10-CM | POA: Diagnosis not present

## 2022-05-07 DIAGNOSIS — I5022 Chronic systolic (congestive) heart failure: Secondary | ICD-10-CM

## 2022-05-07 DIAGNOSIS — R0609 Other forms of dyspnea: Secondary | ICD-10-CM

## 2022-05-07 DIAGNOSIS — Z95810 Presence of automatic (implantable) cardiac defibrillator: Secondary | ICD-10-CM

## 2022-05-07 DIAGNOSIS — I420 Dilated cardiomyopathy: Secondary | ICD-10-CM | POA: Diagnosis not present

## 2022-05-07 DIAGNOSIS — N179 Acute kidney failure, unspecified: Secondary | ICD-10-CM

## 2022-05-07 NOTE — Addendum Note (Signed)
Addended by: Jacobo Forest D on: 05/07/2022 01:36 PM   Modules accepted: Orders

## 2022-05-07 NOTE — Patient Instructions (Signed)
Medication Instructions:  Your physician recommends that you continue on your current medications as directed. Please refer to the Current Medication list given to you today.  *If you need a refill on your cardiac medications before your next appointment, please call your pharmacy*   Lab Work: None Ordered If you have labs (blood work) drawn today and your tests are completely normal, you will receive your results only by: MyChart Message (if you have MyChart) OR A paper copy in the mail If you have any lab test that is abnormal or we need to change your treatment, we will call you to review the results.   Testing/Procedures: Your physician has requested that you have an echocardiogram. Echocardiography is a painless test that uses sound waves to create images of your heart. It provides your doctor with information about the size and shape of your heart and how well your heart's chambers and valves are working. This procedure takes approximately one hour. There are no restrictions for this procedure.     Follow-Up: At [facility], you and your health needs are our priority.  As part of our continuing mission to provide you with exceptional heart care, we have created designated Provider Care Teams.  These Care Teams include your primary Cardiologist (physician) and Advanced Practice Providers (APPs -  Physician Assistants and Nurse Practitioners) who all work together to provide you with the care you need, when you need it.  We recommend signing up for the patient portal called "MyChart".  Sign up information is provided on this After Visit Summary.  MyChart is used to connect with patients for Virtual Visits (Telemedicine).  Patients are able to view lab/test results, encounter notes, upcoming appointments, etc.  Non-urgent messages can be sent to your provider as well.   To learn more about what you can do with MyChart, go to [email or web address].    Your next appointment:   6  month(s)  The format for your next appointment:   In Person  Provider:   Robert Krasowski, MD    Other Instructions NA  

## 2022-05-07 NOTE — Progress Notes (Signed)
Cardiology Office Note:    Date:  05/07/2022   ID:  KENETRA HILDENBRAND, DOB [birth year], MRN [id number]  PCP:  Lillard Anes, MD  Cardiologist:  Jenne Campus, MD    Referring MD: Lillard Anes,*   Chief Complaint  Patient presents with   Follow-up    History of Present Illness:    CHEREESE [name] is a 66 y.o. female with past medical history significant for cardiomyopathy, ejection fraction 35% last estimation, history of BiV ICD, essential hypertension, fibromyalgia. She is in my office today for follow-up overall she seems to be doing well.  She described to have some shortness of breath when she is climbing stairs or trying to rise summer.  Denies have any chest pain tightness squeezing pressure burning chest no discharges from the defibrillator.  Past Medical History:  Diagnosis Date   Abdominal pain 10/17/2020   Abnormality of gait due to impairment of balance 11/22/2020   Acute diverticulitis 10/17/2020   Admission for long-term opiate analgesic use 10/24/2019   Arthritis of right hip 05/29/2016   Formatting of this note might be different from the original. Added automatically from request for surgery [id number]   BMI 26.0-26.9,adult 03/29/2020   Bronchial asthma    Cardiomyopathy ([place])    Overview:  Ejection fraction 45% in 2015 Ejection fraction 30 to 35% in November 2018   Chronic back pain    Chronic constipation 07/31/2021   Chronic hypoxemic respiratory failure ([place]) 10/24/2019   Chronic narcotic use 03/23/2015   Chronic pain of both knees 12/21/2018   Added automatically from request for surgery [id number]  Formatting of this note might be different from the original. 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[place]) 07/13/2020   Hypotension 05/17/2020   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   Ileus following gastrointestinal surgery ([place]) 03/26/2015   Major depressive disorder, single episode, moderate ([place]) 10/24/2019   Malnutrition of moderate degree ([place]) 10/20/2020   Mesenteric ischemia ([place]) 10/16/2020   Migraine without aura with status migrainosus 10/24/2019   Mixed incontinence 10/20/2020   Myocardial infarction (HCC)    Nausea & vomiting    Obstructive chronic bronchitis with exacerbation ([place]) 10/25/2019   Other spondylosis with radiculopathy, lumbar region 10/24/2019   Persistent vomiting 05/17/2020   Presence of left artificial hip joint 10/24/2019   PTSD (post-traumatic stress disorder)    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 03/23/2015 04/19/2014   Senile osteoporosis 10/24/2019   Sepsis ([place]) 10/17/2020   Serosanguineous chronic otitis media of right ear 08/02/2021   Small bowel obstruction (HCC)    Thyroid disease    Upper respiratory tract infection due to COVID-19 virus 07/06/2021   Wellness examination 03/27/2021    Past Surgical History:  Procedure Laterality Date   APPENDECTOMY     CARDIAC CATHETERIZATION  CARPAL TUNNEL RELEASE     CESAREAN SECTION     CHF s/p AICD   12/2010   CHOLECYSTECTOMY     COLONOSCOPY  10/16/2015   Mild colonic diverticulosis, predominantly in the left colon. Small internal hemrrhoids. Otherwise normal  colonoscopy   COLONOSCOPY WITH PROPOFOL N/A 09/27/2021   Procedure: COLONOSCOPY WITH PROPOFOL;  Surgeon: Mauri Pole, MD;  Location: [facility];  Service: Endoscopy;  Laterality: N/A;   CORONARY ANGIOPLASTY     ESOPHAGOGASTRODUODENOSCOPY  02/04/2014   Mild gastritis. Otherwise normal EGD   HAND SURGERY     ICD GENERATOR CHANGEOUT N/A 05/21/2019   Procedure: ICD GENERATOR CHANGEOUT;  Surgeon: Constance Haw, MD;  Location: [facility];  Service: Cardiovascular;  Laterality: N/A;   ICD IMPLANT     Medtronic   intestinal blockage 2011     LAPAROSCOPIC ASSISTED VENTRAL HERNIA REPAIR N/A 03/23/2015   Procedure: LAPAROSCOPIC VENTRAL WALL HERNIA REPAIR;  Surgeon: Michael Boston, MD;  Location: [facility];  Service: General;  Laterality: N/A;  With MESH   LAPAROSCOPIC LYSIS OF ADHESIONS N/A 03/23/2015   Procedure: LAPAROSCOPIC LYSIS OF ADHESIONS;  Surgeon: Michael Boston, MD;  Location: [facility];  Service: General;  Laterality: N/A;   LSCS      x2   NASAL SEPTUM SURGERY     NECK SURGERY     fused   TONSILLECTOMY     ULNAR NERVE TRANSPOSITION  01/23/2012   Procedure: ULNAR NERVE DECOMPRESSION/TRANSPOSITION;  Surgeon: Ophelia Charter, MD;  Location: [facility];  Service: Neurosurgery;  Laterality: Left;  LEFT ulnar nerve decompression    Current Medications: Current Meds  Medication Sig   AIMOVIG 140 MG/ML SOAJ Inject '140mg'$  into THE SKIN every 28 DAYS   ARIPiprazole (ABILIFY) 2 MG tablet Take 1 tablet (2 mg total) by mouth daily.   aspirin 81 MG tablet Take 81 mg by mouth daily.   BREO ELLIPTA 100-25 MCG/ACT AEPB INHALE 1 PUFF BY MOUTH INTO LUNGS DAILY in THE afternoon (Patient taking differently: 1 puff 2 (two) times daily.)   Buprenorphine HCl (BELBUCA) 900 MCG FILM Place 900 mcg inside cheek every 12 (twelve) hours.   carvedilol (COREG) 3.125 MG tablet Take 1 tablet (3.125 mg total) by mouth 2 (two) times daily with a meal.   celecoxib (CELEBREX) 200 MG capsule Take 1  capsule (200 mg total) by mouth every morning.   cyclobenzaprine (FLEXERIL) 10 MG tablet Take 10 mg by mouth 3 (three) times daily as needed for muscle spasms.   denosumab (PROLIA) 60 MG/ML SOSY injection Inject 60 mg into the skin every 6 (six) months.   diazepam (VALIUM) 5 MG tablet TAKE ONE TABLET BY MOUTH THREE TIMES DAILY AS NEEDED FOR ANXIETY   diclofenac Sodium (VOLTAREN) 1 % GEL Apply 2 g topically 4 (four) times daily. (Patient taking differently: Apply 2 g topically daily as needed (Pain).)   EPINEPHRINE 0.3 mg/0.3 mL IJ SOAJ injection Inject 0.3 mLs (0.3 mg total) into the muscle as needed for anaphylaxis.   esomeprazole (NEXIUM) 20 MG capsule Take 1 capsule (20 mg total) by mouth daily.   fluticasone (FLONASE) 50 MCG/ACT nasal spray Place 2 sprays into both nostrils daily as needed (congestion).   furosemide (LASIX) 20 MG tablet Take 1 tablet (20 mg total) by mouth 2 (two) times daily.   gabapentin (NEURONTIN) 800 MG tablet Take 1 tablet (800 mg total) by mouth 3 (three) times daily. Patient usually takes twice a day (Patient taking differently:  Take 800 mg by mouth 3 (three) times daily.)   ipratropium-albuterol (DUONEB) 0.5-2.5 (3) MG/3ML SOLN Use 1 vial in nebulizer every 6 (six) hours as needed for shortness of breath   levocetirizine (XYZAL) 5 MG tablet Take 5 mg by mouth daily as needed for allergies.   megestrol (MEGACE) 20 MG tablet Take 1 tablet (20 mg total) by mouth daily.   Multiple Vitamin (MULTIVITAMIN WITH MINERALS) TABS tablet Take 1 tablet by mouth daily.   naloxegol oxalate (MOVANTIK) 12.5 MG TABS tablet Take 1 tablet (12.5 mg total) by mouth daily.   naloxone (NARCAN) nasal spray 4 mg/0.1 mL Place 1 spray into the nose once.   nitroGLYCERIN (NITROSTAT) 0.4 MG SL tablet Place 1 tablet (0.4 mg total) under the tongue every 5 (five) minutes as needed for chest pain.   ondansetron (ZOFRAN) 4 MG tablet Take 1 tablet (4 mg total) by mouth every 8 (eight) hours as needed for  nausea or vomiting.   oxyCODONE (ROXICODONE) 15 MG immediate release tablet Take 15 mg by mouth 3 (three) times daily as needed for pain.   ranolazine (RANEXA) 500 MG 12 hr tablet TAKE 1 TABLET(500 MG) BY MOUTH TWICE DAILY (Patient taking differently: Take 500 mg by mouth 2 (two) times daily. TAKE 1 TABLET(500 MG) BY MOUTH TWICE DAILY)   rosuvastatin (CRESTOR) 20 MG tablet Take 1 tablet (20 mg total) by mouth daily.   sacubitril-valsartan (ENTRESTO) 24-26 MG Take 1 tablet by mouth 2 (two) times daily.   sertraline (ZOLOFT) 50 MG tablet TAKE 1 TABLET BY MOUTH DAILY (Patient taking differently: Take 50 mg by mouth daily.)   tiZANidine (ZANAFLEX) 4 MG tablet Take 4 mg by mouth at bedtime.   topiramate (TOPAMAX) 50 MG tablet TAKE 2 TABLETS(100 MG) BY MOUTH AT BEDTIME (Patient taking differently: Take 100 mg by mouth at bedtime. TAKE 2 TABLETS(100 MG) BY MOUTH AT BEDTIME)   Ubrogepant (UBRELVY) 100 MG TABS Take 100 mg by mouth as needed (May repeat after 2 hours.  Maximum 2 tablets in 24 hours). TAKE 1 TABLET BY MOUTH BY MOUTH AS NEEDED( MAY REPEAT 1 TABLET AFTER 2 HOURS IF NEEDED, MAXIMUM 2 TABLETS IN 24 HOURS) Strength: 100 mg     Allergies:   Patient has no known allergies.   Social History   Socioeconomic History   Marital status: Single    Spouse name: Not on file   Number of children: 3   Years of education: Not on file   Highest education level: Not on file  Occupational History   Occupation: disabled  Tobacco Use   Smoking status: Former    Packs/day: 0.50    Years: 30.00    Total pack years: 15.00    Types: Cigarettes    Quit date: 03/2020    Years since quitting: 2.1   Smokeless tobacco: Never  Vaping Use   Vaping Use: Never used  Substance and Sexual Activity   Alcohol use: Yes    Alcohol/week: 2.0 standard drinks of alcohol    Types: 1 Cans of beer, 1 Standard drinks or equivalent per week    Comment: seldom   Drug use: No   Sexual activity: Not Currently  Other  Topics Concern   Not on file  Social History Narrative   One level home with boyfriend   Caffeine - coffee 1-2 cups/day; Green tea 4-5 bottles a day   Exercise - some    Right handed      Social Determinants of Health  Financial Resource Strain: Low Risk  (03/27/2021)   Overall Financial Resource Strain (CARDIA)    Difficulty of Paying Living Expenses: Not hard at all  Food Insecurity: No Food Insecurity (10/31/2020)   Hunger Vital Sign    Worried About Running Out of Food in the Last Year: Never true    Ran Out of Food in the Last Year: Never true  Transportation Needs: Unmet Transportation Needs (03/27/2021)   PRAPARE - Hydrologist (Medical): Not on file    Lack of Transportation (Non-Medical): Yes  Physical Activity: Inactive (03/27/2021)   Exercise Vital Sign    Days of Exercise per Week: 0 days    Minutes of Exercise per Session: 0 min  Stress: Stress Concern Present (03/27/2021)   [place]    Feeling of Stress : Rather much  Social Connections: Moderately Integrated (03/27/2021)   Social Connection and Isolation Panel [NHANES]    Frequency of Communication with Friends and Family: More than three times a week    Frequency of Social Gatherings with Friends and Family: Never    Attends Religious Services: 1 to 4 times per year    Active Member of [employer] or Organizations: No    Attends Music therapist: Never    Marital Status: Married     Family History: The patient's family history includes Alcohol abuse in her father; Cancer in an other family member; Heart attack in her father, mother, and another family member; Heart failure in an other family member; Hypertension in her father and mother. There is no history of Anesthesia problems, Hypotension, Malignant hyperthermia, Pseudochol deficiency, or Breast cancer. ROS:   Please see the history of present illness.    All 14  point review of systems negative except as described per history of present illness  EKGs/Labs/Other Studies Reviewed:      Recent Labs: 10/02/2021: NT-Pro BNP 1,261 03/20/2022: ALT 7; BUN 26; Creatinine, Ser 2.05; Hemoglobin 10.3; Platelets 230; Potassium 4.7; Sodium 143  Recent Lipid Panel    Component Value Date/Time   CHOL 154 03/20/2022 1130   TRIG 220 (H) 03/20/2022 1130   HDL 52 03/20/2022 1130   CHOLHDL 3.0 03/20/2022 1130   LDLCALC 66 03/20/2022 1130    Physical Exam:    VS:  BP 112/78 (BP Location: Left Arm, Patient Position: Sitting)   Pulse 76   Ht 5' (1.524 m)   Wt 148 lb (67.1 kg)   SpO2 96%   BMI 28.90 kg/m     Wt Readings from Last 3 Encounters:  05/07/22 148 lb (67.1 kg)  03/20/22 151 lb (68.5 kg)  11/27/21 149 lb 12.8 oz (67.9 kg)     GEN:  Well nourished, well developed in no acute distress HEENT: Normal NECK: No JVD; No carotid bruits LYMPHATICS: No lymphadenopathy CARDIAC: RRR, no murmurs, no rubs, no gallops RESPIRATORY:  Clear to auscultation without rales, wheezing or rhonchi  ABDOMEN: Soft, non-tender, non-distended MUSCULOSKELETAL:  No edema; No deformity  SKIN: Warm and dry LOWER EXTREMITIES: no swelling NEUROLOGIC:  Alert and oriented x 3 PSYCHIATRIC:  Normal affect   ASSESSMENT:    1. Dilated cardiomyopathy ([place])   2. Coronary artery disease involving native coronary artery of native heart without angina pectoris   3. Chronic systolic congestive heart failure, NYHA class 2 ([place])   4. Dyslipidemia   5. ICD (implantable cardioverter-defibrillator) in place    PLAN:    In order  of problems listed above:  Dilated cardiomyopathy.  She is on maximally tolerated medications.  She is on Entresto only small dose because of kidney dysfunction, also on small dose of beta-blocker because of difficulty tolerating it.  However overall seems to stabilizing.  I will ask her to have echocardiogram done to recheck left ventricle ejection  fraction. Coronary artery disease, stable.  Doing well from that point review.  Continue antiplatelets therapy. Dyslipidemia I did review K PN which only dated from 03/20/2022 with LDL of 66 HDL 52, she is on Crestor 20 which is high intense statin I continue ICD present this is a Medtronic device normal function, OptiVol stable.,  No therapies no detections   Medication Adjustments/Labs and Tests Ordered: Current medicines are reviewed at length with the patient today.  Concerns regarding medicines are outlined above.  No orders of the defined types were placed in this encounter.  Medication changes: No orders of the defined types were placed in this encounter.   Signed, Park Liter, MD, [facility] 05/07/2022 1:14 PM    [place]

## 2022-05-20 ENCOUNTER — Ambulatory Visit: Payer: 59 | Attending: Cardiology

## 2022-05-20 DIAGNOSIS — R0609 Other forms of dyspnea: Secondary | ICD-10-CM

## 2022-05-20 LAB — ECHOCARDIOGRAM COMPLETE
Area-P 1/2: 4.8 cm2
Calc EF: 36.9 %
MV M vel: 4.9 m/s
MV Peak grad: 96 mmHg
Radius: 0.4 cm
S' Lateral: 5 cm
Single Plane A2C EF: 39.1 %
Single Plane A4C EF: 36.9 %

## 2022-05-23 ENCOUNTER — Telehealth: Payer: Self-pay

## 2022-05-23 NOTE — Telephone Encounter (Signed)
Patient notified of results.

## 2022-05-23 NOTE — Telephone Encounter (Signed)
-----   Message from Park Liter, MD sent at 05/21/2022  1:36 PM EDT ----- Echocardiogram showed ejection fraction 4045% which is actually better than it was last year previously 30-35.  There is slight improvement.  For now continue present management

## 2022-06-05 ENCOUNTER — Ambulatory Visit (INDEPENDENT_AMBULATORY_CARE_PROVIDER_SITE_OTHER): Payer: 59

## 2022-06-05 DIAGNOSIS — I420 Dilated cardiomyopathy: Secondary | ICD-10-CM | POA: Diagnosis not present

## 2022-06-05 LAB — CUP PACEART REMOTE DEVICE CHECK
Battery Remaining Longevity: 97 mo
Battery Voltage: 2.95 V
Brady Statistic AP VP Percent: 0.01 %
Brady Statistic AP VS Percent: 12.33 %
Brady Statistic AS VP Percent: 0.03 %
Brady Statistic AS VS Percent: 87.62 %
Brady Statistic RA Percent Paced: 12.22 %
Brady Statistic RV Percent Paced: 0.04 %
Date Time Interrogation Session: 20231011001604
HighPow Impedance: 69 Ohm
Implantable Lead Implant Date: 20120502
Implantable Lead Implant Date: 20120502
Implantable Lead Location: 753859
Implantable Lead Location: 753860
Implantable Lead Model: 4076
Implantable Lead Model: 7122
Implantable Pulse Generator Implant Date: 20200925
Lead Channel Impedance Value: 361 Ohm
Lead Channel Impedance Value: 589 Ohm
Lead Channel Impedance Value: 722 Ohm
Lead Channel Pacing Threshold Amplitude: 0.375 V
Lead Channel Pacing Threshold Amplitude: 1.125 V
Lead Channel Pacing Threshold Pulse Width: 0.4 ms
Lead Channel Pacing Threshold Pulse Width: 0.4 ms
Lead Channel Sensing Intrinsic Amplitude: 3.25 mV
Lead Channel Sensing Intrinsic Amplitude: 3.25 mV
Lead Channel Sensing Intrinsic Amplitude: 9.625 mV
Lead Channel Sensing Intrinsic Amplitude: 9.625 mV
Lead Channel Setting Pacing Amplitude: 1.5 V
Lead Channel Setting Pacing Amplitude: 2.5 V
Lead Channel Setting Pacing Pulse Width: 0.4 ms
Lead Channel Setting Sensing Sensitivity: 0.3 mV

## 2022-06-18 NOTE — Progress Notes (Signed)
Remote ICD transmission.   

## 2022-06-25 ENCOUNTER — Ambulatory Visit (INDEPENDENT_AMBULATORY_CARE_PROVIDER_SITE_OTHER): Payer: 59 | Admitting: Legal Medicine

## 2022-06-25 ENCOUNTER — Encounter: Payer: Self-pay | Admitting: Legal Medicine

## 2022-06-25 VITALS — BP 90/66 | HR 85 | Temp 98.8°F | Resp 14 | Ht 60.0 in | Wt 148.0 lb

## 2022-06-25 DIAGNOSIS — J180 Bronchopneumonia, unspecified organism: Secondary | ICD-10-CM

## 2022-06-25 DIAGNOSIS — Z9189 Other specified personal risk factors, not elsewhere classified: Secondary | ICD-10-CM | POA: Diagnosis not present

## 2022-06-25 LAB — POC COVID19 BINAXNOW: SARS Coronavirus 2 Ag: NEGATIVE

## 2022-06-25 MED ORDER — CEFTRIAXONE SODIUM 1 G IJ SOLR
1.0000 g | Freq: Once | INTRAMUSCULAR | Status: AC
  Administered 2022-06-25: 1 g via INTRAMUSCULAR

## 2022-06-25 MED ORDER — CHERATUSSIN AC 100-10 MG/5ML PO SOLN: 5.0000 mL | Freq: Three times a day (TID) | ORAL | 0 refills | Status: DC | PRN

## 2022-06-25 MED ORDER — AMOXICILLIN-POT CLAVULANATE 875-125 MG PO TABS: 1.0000 | ORAL_TABLET | Freq: Two times a day (BID) | ORAL | 0 refills | Status: DC

## 2022-06-25 MED ORDER — CEFTRIAXONE SODIUM 1 G IJ SOLR: 1.0000 g | INTRAMUSCULAR | Status: DC

## 2022-06-25 NOTE — Progress Notes (Signed)
Acute Office Visit  Subjective:    Patient ID: [name], female    DOB: [birth year], 65 y.o.   MRN: [id number]  Chief Complaint  Patient presents with   Cough   Wheezing    HPI: Patient is in today for dry cough, and wheezing with chest tightness since 3 days ago. She has tried OTC medicine, but it is not helping.< Hot and cold chills, no fever.  Cough productive thick mucous.  Past Medical History:  Diagnosis Date   Abdominal pain 10/17/2020   Abnormality of gait due to impairment of balance 11/22/2020   Acute diverticulitis 10/17/2020   Admission for long-term opiate analgesic use 10/24/2019   Arthritis of right hip 05/29/2016   Formatting of this note might be different from the original. Added automatically from request for surgery [id number]   BMI 26.0-26.9,adult 03/29/2020   Bronchial asthma    Cardiomyopathy ([place])    Overview:  Ejection fraction 45% in 2015 Ejection fraction 30 to 35% in November 2018   Chronic back pain    Chronic constipation 07/31/2021   Chronic hypoxemic respiratory failure ([place]) 10/24/2019   Chronic narcotic use 03/23/2015   Chronic pain of both knees 12/21/2018   Added automatically from request for surgery [id number]  Formatting of this note might be different from the original. 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) 10/24/2019   Malnutrition of moderate degree ([place]) 10/20/2020   Mesenteric ischemia ([place]) 10/16/2020   Migraine without aura with status migrainosus 10/24/2019   Mixed incontinence 10/20/2020   Myocardial infarction (HCC)    Nausea & vomiting    Obstructive chronic bronchitis with exacerbation ([place]) 10/25/2019   Other spondylosis with radiculopathy, lumbar region 10/24/2019   Persistent vomiting 05/17/2020   Presence of left artificial hip joint 10/24/2019   PTSD (post-traumatic stress disorder)    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 03/23/2015 04/19/2014   Senile osteoporosis 10/24/2019   Sepsis ([place]) 10/17/2020   Serosanguineous chronic otitis media of right ear 08/02/2021   Small bowel obstruction (HCC)    Thyroid disease    Upper respiratory tract infection due to COVID-19 virus 07/06/2021   Wellness examination 03/27/2021    Past Surgical History:  Procedure Laterality Date   APPENDECTOMY     CARDIAC CATHETERIZATION     CARPAL TUNNEL RELEASE     CESAREAN SECTION     CHF s/p AICD   12/2010   CHOLECYSTECTOMY     COLONOSCOPY  10/16/2015   Mild colonic diverticulosis, predominantly in the left colon. Small internal hemrrhoids. Otherwise normal colonoscopy   COLONOSCOPY WITH PROPOFOL N/A 09/27/2021   Procedure:  COLONOSCOPY WITH PROPOFOL;  Surgeon: Mauri Pole, MD;  Location: [facility];  Service: Endoscopy;  Laterality: N/A;   CORONARY ANGIOPLASTY     ESOPHAGOGASTRODUODENOSCOPY  02/04/2014   Mild gastritis. Otherwise normal EGD   HAND SURGERY     ICD GENERATOR CHANGEOUT N/A 05/21/2019   Procedure: ICD GENERATOR CHANGEOUT;   Surgeon: Constance Haw, MD;  Location: [facility];  Service: Cardiovascular;  Laterality: N/A;   ICD IMPLANT     Medtronic   intestinal blockage 2011     LAPAROSCOPIC ASSISTED VENTRAL HERNIA REPAIR N/A 03/23/2015   Procedure: LAPAROSCOPIC VENTRAL WALL HERNIA REPAIR;  Surgeon: Michael Boston, MD;  Location: [facility];  Service: General;  Laterality: N/A;  With MESH   LAPAROSCOPIC LYSIS OF ADHESIONS N/A 03/23/2015   Procedure: LAPAROSCOPIC LYSIS OF ADHESIONS;  Surgeon: Michael Boston, MD;  Location: [facility];  Service: General;  Laterality: N/A;   LSCS      x2   NASAL SEPTUM SURGERY     NECK SURGERY     fused   TONSILLECTOMY     ULNAR NERVE TRANSPOSITION  01/23/2012   Procedure: ULNAR NERVE DECOMPRESSION/TRANSPOSITION;  Surgeon: Ophelia Charter, MD;  Location: [facility];  Service: Neurosurgery;  Laterality: Left;  LEFT ulnar nerve decompression    Family History  Problem Relation Age of Onset   Hypertension Mother    Heart attack Mother    Alcohol abuse Father    Hypertension Father    Heart attack Father    Heart attack Other    Cancer Other    Heart failure Other    Anesthesia problems Neg Hx    Hypotension Neg Hx    Malignant hyperthermia Neg Hx    Pseudochol deficiency Neg Hx    Breast cancer Neg Hx     Social History   Socioeconomic History   Marital status: Single    Spouse name: Not on file   Number of children: 3   Years of education: Not on file   Highest education level: Not on file  Occupational History   Occupation: disabled  Tobacco Use   Smoking status: Former    Packs/day: 0.50    Years: 30.00    Total pack years: 15.00    Types: Cigarettes    Quit date: 03/2020    Years since quitting: 2.2   Smokeless tobacco: Never  Vaping Use   Vaping Use: Never used  Substance and Sexual Activity   Alcohol use: Yes    Alcohol/week: 2.0 standard drinks of alcohol    Types: 1 Cans of beer, 1 Standard drinks or equivalent per week    Comment: seldom    Drug use: No   Sexual activity: Not Currently  Other Topics Concern   Not on file  Social History Narrative   One level home with boyfriend   Caffeine - coffee 1-2 cups/day; Green tea 4-5 bottles a day   Exercise - some    Right handed      Social Determinants of Health   Financial Resource Strain: Low Risk  (03/27/2021)   Overall Financial Resource Strain (CARDIA)    Difficulty of Paying Living Expenses: Not hard at all  Food Insecurity: No Food Insecurity (10/31/2020)   Hunger Vital Sign    Worried About Running Out of Food in the Last Year: Never true    Ran Out of Food in the Last Year: Never true  Transportation Needs: Unmet Transportation Needs (03/27/2021)   PRAPARE -  Hydrologist (Medical): Not on file    Lack of Transportation (Non-Medical): Yes  Physical Activity: Inactive (03/27/2021)   Exercise Vital Sign    Days of Exercise per Week: 0 days    Minutes of Exercise per Session: 0 min  Stress: Stress Concern Present (03/27/2021)   [place]    Feeling of Stress : Rather much  Social Connections: Moderately Integrated (03/27/2021)   Social Connection and Isolation Panel [NHANES]    Frequency of Communication with Friends and Family: More than three times a week    Frequency of Social Gatherings with Friends and Family: Never    Attends Religious Services: 1 to 4 times per year    Active Member of [employer] or Organizations: No    Attends Archivist Meetings: Never    Marital Status: Married  Human resources officer Violence: Unknown (03/27/2021)   Humiliation, Afraid, Rape, and Kick questionnaire    Fear of Current or Ex-Partner: No    Emotionally Abused: No    Physically Abused: No    Sexually Abused: Not on file    Outpatient Medications Prior to Visit  Medication Sig Dispense Refill   AIMOVIG 140 MG/ML SOAJ Inject 165m into THE SKIN every 28 DAYS 1 mL 1   ARIPiprazole  (ABILIFY) 2 MG tablet Take 1 tablet (2 mg total) by mouth daily. 90 tablet 2   aspirin 81 MG tablet Take 81 mg by mouth daily.     BREO ELLIPTA 100-25 MCG/ACT AEPB INHALE 1 PUFF BY MOUTH INTO LUNGS DAILY in THE afternoon (Patient taking differently: 1 puff 2 (two) times daily.) 180 each 2   Buprenorphine HCl (BELBUCA) 900 MCG FILM Place 900 mcg inside cheek every 12 (twelve) hours.     carvedilol (COREG) 3.125 MG tablet Take 1 tablet (3.125 mg total) by mouth 2 (two) times daily with a meal. 180 tablet 2   celecoxib (CELEBREX) 200 MG capsule Take 1 capsule (200 mg total) by mouth every morning. 90 capsule 2   cyclobenzaprine (FLEXERIL) 10 MG tablet Take 10 mg by mouth 3 (three) times daily as needed for muscle spasms.     denosumab (PROLIA) 60 MG/ML SOSY injection Inject 60 mg into the skin every 6 (six) months. 180 mL 2   diazepam (VALIUM) 5 MG tablet TAKE ONE TABLET BY MOUTH THREE TIMES DAILY AS NEEDED FOR ANXIETY 90 tablet 3   diclofenac Sodium (VOLTAREN) 1 % GEL Apply 2 g topically 4 (four) times daily. (Patient taking differently: Apply 2 g topically daily as needed (Pain).) 50 g 4   EPINEPHRINE 0.3 mg/0.3 mL IJ SOAJ injection Inject 0.3 mLs (0.3 mg total) into the muscle as needed for anaphylaxis. 1 each 2   esomeprazole (NEXIUM) 20 MG capsule Take 1 capsule (20 mg total) by mouth daily. 90 capsule 1   fluticasone (FLONASE) 50 MCG/ACT nasal spray Place 2 sprays into both nostrils daily as needed (congestion). 18 mL 2   furosemide (LASIX) 20 MG tablet Take 1 tablet (20 mg total) by mouth 2 (two) times daily. 180 tablet 1   gabapentin (NEURONTIN) 800 MG tablet Take 1 tablet (800 mg total) by mouth 3 (three) times daily. Patient usually takes twice a day (Patient taking differently: Take 800 mg by mouth 3 (three) times daily.) 270 tablet 2   ipratropium-albuterol (DUONEB) 0.5-2.5 (3) MG/3ML SOLN Use 1 vial in nebulizer every 6 (six) hours as needed for shortness of  breath 3 mL 11   levocetirizine  (XYZAL) 5 MG tablet Take 5 mg by mouth daily as needed for allergies.     megestrol (MEGACE) 20 MG tablet Take 1 tablet (20 mg total) by mouth daily. 90 tablet 2   Multiple Vitamin (MULTIVITAMIN WITH MINERALS) TABS tablet Take 1 tablet by mouth daily.     naloxegol oxalate (MOVANTIK) 12.5 MG TABS tablet Take 1 tablet (12.5 mg total) by mouth daily. 30 tablet 5   naloxone (NARCAN) nasal spray 4 mg/0.1 mL Place 1 spray into the nose once.     nitroGLYCERIN (NITROSTAT) 0.4 MG SL tablet Place 1 tablet (0.4 mg total) under the tongue every 5 (five) minutes as needed for chest pain. 25 tablet 6   ondansetron (ZOFRAN) 4 MG tablet Take 1 tablet (4 mg total) by mouth every 8 (eight) hours as needed for nausea or vomiting. 20 tablet 0   oxyCODONE (ROXICODONE) 15 MG immediate release tablet Take 15 mg by mouth 3 (three) times daily as needed for pain.     ranolazine (RANEXA) 500 MG 12 hr tablet TAKE 1 TABLET(500 MG) BY MOUTH TWICE DAILY (Patient taking differently: Take 500 mg by mouth 2 (two) times daily. TAKE 1 TABLET(500 MG) BY MOUTH TWICE DAILY) 180 tablet 3   rosuvastatin (CRESTOR) 20 MG tablet Take 1 tablet (20 mg total) by mouth daily. 90 tablet 1   sacubitril-valsartan (ENTRESTO) 24-26 MG Take 1 tablet by mouth 2 (two) times daily. 180 tablet 2   sertraline (ZOLOFT) 50 MG tablet TAKE 1 TABLET BY MOUTH DAILY (Patient taking differently: Take 50 mg by mouth daily.) 90 tablet 2   tiZANidine (ZANAFLEX) 4 MG tablet Take 4 mg by mouth at bedtime.     topiramate (TOPAMAX) 50 MG tablet TAKE 2 TABLETS(100 MG) BY MOUTH AT BEDTIME (Patient taking differently: Take 100 mg by mouth at bedtime. TAKE 2 TABLETS(100 MG) BY MOUTH AT BEDTIME) 180 tablet 0   Ubrogepant (UBRELVY) 100 MG TABS Take 100 mg by mouth as needed (May repeat after 2 hours.  Maximum 2 tablets in 24 hours). TAKE 1 TABLET BY MOUTH BY MOUTH AS NEEDED( MAY REPEAT 1 TABLET AFTER 2 HOURS IF NEEDED, MAXIMUM 2 TABLETS IN 24 HOURS) Strength: 100 mg 16 tablet  5   No facility-administered medications prior to visit.    No Known Allergies  Review of Systems  Constitutional:  Positive for chills. Negative for fatigue and fever.  HENT:  Positive for congestion. Negative for ear pain, sinus pain and sore throat.   Eyes:  Negative for visual disturbance.  Respiratory:  Positive for cough, shortness of breath and wheezing.   Cardiovascular:  Negative for chest pain.  Gastrointestinal: Negative.   Musculoskeletal: Negative.  Negative for myalgias.  Neurological: Negative.  Negative for headaches.       Objective:    Physical Exam Vitals reviewed.  Constitutional:      Appearance: Normal appearance.  HENT:     Head: Normocephalic.     Right Ear: Tympanic membrane normal.     Left Ear: Tympanic membrane normal.     Nose: Nose normal.     Mouth/Throat:     Mouth: Mucous membranes are dry.  Eyes:     Extraocular Movements: Extraocular movements intact.     Conjunctiva/sclera: Conjunctivae normal.     Pupils: Pupils are equal, round, and reactive to light.  Cardiovascular:     Rate and Rhythm: Normal rate and regular rhythm.  Pulses: Normal pulses.     Heart sounds: No murmur heard.    No gallop.  Pulmonary:     Effort: Pulmonary effort is normal.     Breath sounds: Rhonchi present. No wheezing.  Abdominal:     General: Abdomen is flat. Bowel sounds are normal. There is no distension.  Musculoskeletal:        General: Normal range of motion.     Cervical back: Normal range of motion.  Skin:    General: Skin is warm.     Capillary Refill: Capillary refill takes less than 2 seconds.  Neurological:     General: No focal deficit present.     Mental Status: She is alert.     BP 90/66   Pulse 85   Temp 98.8 F (37.1 C)   Resp 14   Ht 5' (1.524 m)   Wt 148 lb (67.1 kg)   SpO2 91%   BMI 28.90 kg/m  Wt Readings from Last 3 Encounters:  06/25/22 148 lb (67.1 kg)  05/07/22 148 lb (67.1 kg)  03/20/22 151 lb (68.5 kg)     Health Maintenance Due  Topic Date Due   Hepatitis C Screening  Never done   TETANUS/TDAP  Never done   Zoster Vaccines- Shingrix (1 of 2) Never done   Pneumonia Vaccine 51+ Years old (3 - PCV) 06/02/2021   INFLUENZA VACCINE  03/26/2022   Medicare Annual Wellness (AWV)  03/27/2022    There are no preventive care reminders to display for this patient.   Lab Results  Component Value Date   TSH 0.816 02/15/2021   Lab Results  Component Value Date   WBC 7.6 03/20/2022   HGB 10.3 (L) 03/20/2022   HCT 31.1 (L) 03/20/2022   MCV 93 03/20/2022   PLT 230 03/20/2022   Lab Results  Component Value Date   NA 143 03/20/2022   K 4.7 03/20/2022   CO2 19 (L) 03/20/2022   GLUCOSE 110 (H) 03/20/2022   BUN 26 03/20/2022   CREATININE 2.05 (H) 03/20/2022   BILITOT 0.2 03/20/2022   ALKPHOS 63 03/20/2022   AST 16 03/20/2022   ALT 7 03/20/2022   PROT 6.8 03/20/2022   ALBUMIN 4.4 03/20/2022   CALCIUM 10.0 03/20/2022   ANIONGAP 6 10/29/2020   EGFR 26 (L) 03/20/2022   Lab Results  Component Value Date   CHOL 154 03/20/2022   Lab Results  Component Value Date   HDL 52 03/20/2022   Lab Results  Component Value Date   LDLCALC 66 03/20/2022   Lab Results  Component Value Date   TRIG 220 (H) 03/20/2022   Lab Results  Component Value Date   CHOLHDL 3.0 03/20/2022   No results found for: "HGBA1C"     Assessment & Plan:   Problem List Items Addressed This Visit   None Visit Diagnoses     At increased risk of exposure to COVID-19 virus    -  Primary   Relevant Orders   POC COVID-19- negative   DG Chest 2 View   Bronchopneumonia       Relevant Medications   cefTRIAXone (ROCEPHIN) injection 1 g   guaiFENesin-codeine (CHERATUSSIN AC) 100-10 MG/5ML syrup   amoxicillin-clavulanate (AUGMENTIN) 875-125 MG tablet Treat bronchopneumonia with rocephin and augmentin, get chest x-ray      Meds ordered this encounter  Medications   cefTRIAXone (ROCEPHIN) injection 1 g     Order Specific Question:   Antibiotic Indication:    Answer:  Other Indication (list below)    Order Specific Question:   Other Indication:    Answer:   bronchopneumonia   guaiFENesin-codeine (CHERATUSSIN AC) 100-10 MG/5ML syrup    Sig: Take 5 mLs by mouth 3 (three) times daily as needed.    Dispense:  120 mL    Refill:  0   amoxicillin-clavulanate (AUGMENTIN) 875-125 MG tablet    Sig: Take 1 tablet by mouth 2 (two) times daily.    Dispense:  20 tablet    Refill:  0    Orders Placed This Encounter  Procedures   DG Chest 2 View   POC COVID-19     Follow-up: Return in about 1 week (around 07/02/2022).  An After Visit Summary was printed and given to the patient.  Reinaldo Meeker, MD [facility] [phone]

## 2022-06-25 NOTE — Addendum Note (Signed)
Addended by: Thompson Caul I on: 06/25/2022 09:38 PM   Modules accepted: Orders

## 2022-06-28 ENCOUNTER — Ambulatory Visit: Payer: 59 | Admitting: Family Medicine

## 2022-07-01 ENCOUNTER — Other Ambulatory Visit: Payer: Self-pay | Admitting: Neurology

## 2022-07-03 ENCOUNTER — Other Ambulatory Visit: Payer: Self-pay

## 2022-07-03 ENCOUNTER — Emergency Department (HOSPITAL_COMMUNITY): Payer: 59

## 2022-07-03 ENCOUNTER — Emergency Department (HOSPITAL_COMMUNITY)
Admission: EM | Admit: 2022-07-03 | Discharge: 2022-07-04 | Discharge status: Against Medical Advice | Payer: 59 | Attending: Emergency Medicine | Admitting: Emergency Medicine

## 2022-07-03 DIAGNOSIS — I509 Heart failure, unspecified: Secondary | ICD-10-CM | POA: Diagnosis not present

## 2022-07-03 DIAGNOSIS — Z7982 Long term (current) use of aspirin: Secondary | ICD-10-CM | POA: Insufficient documentation

## 2022-07-03 DIAGNOSIS — R051 Acute cough: Secondary | ICD-10-CM | POA: Diagnosis not present

## 2022-07-03 DIAGNOSIS — I2510 Atherosclerotic heart disease of native coronary artery without angina pectoris: Secondary | ICD-10-CM | POA: Diagnosis not present

## 2022-07-03 DIAGNOSIS — R0602 Shortness of breath: Secondary | ICD-10-CM | POA: Diagnosis not present

## 2022-07-03 DIAGNOSIS — Z7952 Long term (current) use of systemic steroids: Secondary | ICD-10-CM | POA: Diagnosis not present

## 2022-07-03 DIAGNOSIS — R079 Chest pain, unspecified: Secondary | ICD-10-CM | POA: Insufficient documentation

## 2022-07-03 DIAGNOSIS — J449 Chronic obstructive pulmonary disease, unspecified: Secondary | ICD-10-CM | POA: Diagnosis not present

## 2022-07-03 DIAGNOSIS — Z1152 Encounter for screening for COVID-19: Secondary | ICD-10-CM | POA: Insufficient documentation

## 2022-07-03 DIAGNOSIS — R059 Cough, unspecified: Secondary | ICD-10-CM | POA: Insufficient documentation

## 2022-07-03 DIAGNOSIS — Z5321 Procedure and treatment not carried out due to patient leaving prior to being seen by health care provider: Secondary | ICD-10-CM | POA: Diagnosis not present

## 2022-07-03 DIAGNOSIS — M62830 Muscle spasm of back: Secondary | ICD-10-CM | POA: Insufficient documentation

## 2022-07-03 LAB — CBC
HCT: 32.6 % — ABNORMAL LOW (ref 36.0–46.0)
Hemoglobin: 10.3 g/dL — ABNORMAL LOW (ref 12.0–15.0)
MCH: 31.5 pg (ref 26.0–34.0)
MCHC: 31.6 g/dL (ref 30.0–36.0)
MCV: 99.7 fL (ref 80.0–100.0)
Platelets: 301 10*3/uL (ref 150–400)
RBC: 3.27 MIL/uL — ABNORMAL LOW (ref 3.87–5.11)
RDW: 12.2 % (ref 11.5–15.5)
WBC: 9.5 10*3/uL (ref 4.0–10.5)
nRBC: 0 % (ref 0.0–0.2)

## 2022-07-03 LAB — BASIC METABOLIC PANEL
Anion gap: 9 (ref 5–15)
BUN: 32 mg/dL — ABNORMAL HIGH (ref 8–23)
CO2: 19 mmol/L — ABNORMAL LOW (ref 22–32)
Calcium: 9.4 mg/dL (ref 8.9–10.3)
Chloride: 110 mmol/L (ref 98–111)
Creatinine, Ser: 1.92 mg/dL — ABNORMAL HIGH (ref 0.44–1.00)
GFR, Estimated: 28 mL/min — ABNORMAL LOW (ref >60)
Glucose, Bld: 115 mg/dL — ABNORMAL HIGH (ref 70–99)
Potassium: 4.4 mmol/L (ref 3.5–5.1)
Sodium: 138 mmol/L (ref 135–145)

## 2022-07-03 LAB — TROPONIN I (HIGH SENSITIVITY)
Troponin I (High Sensitivity): 36 ng/L — ABNORMAL HIGH (ref <18)
Troponin I (High Sensitivity): 40 ng/L — ABNORMAL HIGH (ref <18)

## 2022-07-03 LAB — RESP PANEL BY RT-PCR (FLU A&B, COVID) ARPGX2
Influenza A by PCR: NEGATIVE
Influenza B by PCR: NEGATIVE
SARS Coronavirus 2 by RT PCR: NEGATIVE

## 2022-07-03 NOTE — ED Triage Notes (Signed)
Pt reports "my lungs hurt with breathing and coughing". C/o sob, productive cough, and dizziness when standing x5 days. Pt went to pcp for same on 10/31 with relief Hx chf, sob worse when laying flat.  Denies cp

## 2022-07-03 NOTE — ED Provider Triage Note (Signed)
Emergency Medicine Provider Triage Evaluation Note  KAELIE HENIGAN , a 66 y.o. female  was evaluated in triage.  Pt complains of chest pain, SOB, and coughing for the past few days. She reports that she called her PCP and was told to come intot he ER for possible PNA. The patient denies any fevers, but reports some chills at night.  Review of Systems  Positive:  Negative:   Physical Exam  BP 126/77 (BP Location: Left Arm)   Pulse 89   Temp 98.5 F (36.9 C) (Oral)   Resp 16   Ht 5' (1.524 m)   Wt 68 kg   SpO2 100%   BMI 29.29 kg/m  Gen:   Awake, no distress   Resp:  Normal effort  MSK:   Moves extremities without difficulty  Other:  Diminshed lung sounds  Medical Decision Making  Medically screening exam initiated at 6:17 PM.  Appropriate orders placed.  Nicha Hemann Grandstaff was informed that the remainder of the evaluation will be completed by another provider, this initial triage assessment does not replace that evaluation, and the importance of remaining in the ED until their evaluation is complete.  Cardiac workup with COVID ordered   Sherrell Puller, PA-C 07/03/22 1818

## 2022-07-04 ENCOUNTER — Emergency Department (HOSPITAL_COMMUNITY)
Admission: EM | Admit: 2022-07-04 | Discharge: 2022-07-04 | Disposition: A | Discharge status: Home / Self Care | Payer: 59 | Source: Home / Self Care | Attending: Emergency Medicine | Admitting: Emergency Medicine

## 2022-07-04 ENCOUNTER — Other Ambulatory Visit: Payer: Self-pay

## 2022-07-04 ENCOUNTER — Encounter (HOSPITAL_COMMUNITY): Payer: Self-pay | Admitting: Emergency Medicine

## 2022-07-04 DIAGNOSIS — Z7952 Long term (current) use of systemic steroids: Secondary | ICD-10-CM | POA: Insufficient documentation

## 2022-07-04 DIAGNOSIS — I2510 Atherosclerotic heart disease of native coronary artery without angina pectoris: Secondary | ICD-10-CM | POA: Insufficient documentation

## 2022-07-04 DIAGNOSIS — R051 Acute cough: Secondary | ICD-10-CM | POA: Insufficient documentation

## 2022-07-04 DIAGNOSIS — J449 Chronic obstructive pulmonary disease, unspecified: Secondary | ICD-10-CM | POA: Insufficient documentation

## 2022-07-04 DIAGNOSIS — R0602 Shortness of breath: Secondary | ICD-10-CM | POA: Insufficient documentation

## 2022-07-04 DIAGNOSIS — Z7982 Long term (current) use of aspirin: Secondary | ICD-10-CM | POA: Insufficient documentation

## 2022-07-04 DIAGNOSIS — M62830 Muscle spasm of back: Secondary | ICD-10-CM | POA: Insufficient documentation

## 2022-07-04 DIAGNOSIS — I509 Heart failure, unspecified: Secondary | ICD-10-CM | POA: Insufficient documentation

## 2022-07-04 DIAGNOSIS — R079 Chest pain, unspecified: Secondary | ICD-10-CM | POA: Diagnosis not present

## 2022-07-04 LAB — TROPONIN I (HIGH SENSITIVITY): Troponin I (High Sensitivity): 33 ng/L — ABNORMAL HIGH

## 2022-07-04 MED ORDER — PREDNISONE 10 MG PO TABS: 40.0000 mg | ORAL_TABLET | Freq: Every day | ORAL | 0 refills | Status: AC

## 2022-07-04 MED ORDER — IPRATROPIUM-ALBUTEROL 0.5-2.5 (3) MG/3ML IN SOLN
3.0000 mL | Freq: Once | RESPIRATORY_TRACT | Status: AC
  Administered 2022-07-04: 3 mL via RESPIRATORY_TRACT
  Filled 2022-07-04: qty 3

## 2022-07-04 MED ORDER — LIDOCAINE 5 % EX PTCH
1.0000 | MEDICATED_PATCH | CUTANEOUS | Status: DC
  Administered 2022-07-04: 1 via TRANSDERMAL
  Filled 2022-07-04: qty 1

## 2022-07-04 MED ORDER — BENZONATATE 200 MG PO CAPS: 200.0000 mg | ORAL_CAPSULE | Freq: Three times a day (TID) | ORAL | 0 refills | Status: AC

## 2022-07-04 MED ORDER — PREDNISONE 20 MG PO TABS
60.0000 mg | ORAL_TABLET | Freq: Once | ORAL | Status: AC
  Administered 2022-07-04: 60 mg via ORAL
  Filled 2022-07-04: qty 3

## 2022-07-04 NOTE — Progress Notes (Deleted)
Subjective:  Patient ID: [name], female    DOB: [birth year]  Age: 66 y.o. MRN: [id number]  No chief complaint on file.   HPI  CHF: Patient is taking Entresto 24-26 mg twice a day, Carvedilol 3.'125mg'$  twice a day. Gained one lb. No DOE or PND, no chest pain  Depression: She is taking Sertraline 50 mg daily.  Patient presents with hyperlipidemia.  Compliance with treatment has been good; patient takes medicines as directed, maintains low cholesterol diet, follows up as directed, and maintains exercise regimen.  Patient is using Rosuvastatin 20 mg daily without problems.  CHF- stable at present stage 2, no CHF symptoms  Current Outpatient Medications on File Prior to Visit  Medication Sig Dispense Refill   amoxicillin-clavulanate (AUGMENTIN) 875-125 MG tablet Take 1 tablet by mouth 2 (two) times daily. 20 tablet 0   ARIPiprazole (ABILIFY) 2 MG tablet Take 1 tablet (2 mg total) by mouth daily. 90 tablet 2   aspirin 81 MG tablet Take 81 mg by mouth daily.     benzonatate (TESSALON) 200 MG capsule Take 1 capsule (200 mg total) by mouth every 8 (eight) hours for 10 days. 30 capsule 0   BREO ELLIPTA 100-25 MCG/ACT AEPB INHALE 1 PUFF BY MOUTH INTO LUNGS DAILY in THE afternoon (Patient taking differently: 1 puff 2 (two) times daily.) 180 each 2   Buprenorphine HCl (BELBUCA) 900 MCG FILM Place 900 mcg inside cheek every 12 (twelve) hours.     carvedilol (COREG) 3.125 MG tablet Take 1 tablet (3.125 mg total) by mouth 2 (two) times daily with a meal. 180 tablet 2   celecoxib (CELEBREX) 200 MG capsule Take 1 capsule (200 mg total) by mouth every morning. 90 capsule 2   denosumab (PROLIA) 60 MG/ML SOSY injection Inject 60 mg into the skin every 6 (six) months. 180 mL 2   diazepam (VALIUM) 5 MG tablet TAKE ONE TABLET BY MOUTH THREE TIMES DAILY AS NEEDED FOR ANXIETY 90 tablet 3   diclofenac Sodium (VOLTAREN) 1 % GEL Apply 2 g topically 4 (four) times daily. (Patient taking differently: Apply 2 g  topically daily as needed (Pain).) 50 g 4   EPINEPHRINE 0.3 mg/0.3 mL IJ SOAJ injection Inject 0.3 mLs (0.3 mg total) into the muscle as needed for anaphylaxis. 1 each 2   Erenumab-aooe (AIMOVIG) 140 MG/ML SOAJ Inject '140mg'$  into THE SKIN every 28 DAYS 1 mL 0   esomeprazole (NEXIUM) 20 MG capsule Take 1 capsule (20 mg total) by mouth daily. 90 capsule 1   fluticasone (FLONASE) 50 MCG/ACT nasal spray Place 2 sprays into both nostrils daily as needed (congestion). 18 mL 2   furosemide (LASIX) 20 MG tablet Take 1 tablet (20 mg total) by mouth 2 (two) times daily. 180 tablet 1   gabapentin (NEURONTIN) 800 MG tablet Take 1 tablet (800 mg total) by mouth 3 (three) times daily. Patient usually takes twice a day (Patient taking differently: Take 800 mg by mouth 3 (three) times daily.) 270 tablet 2   guaiFENesin-codeine (CHERATUSSIN AC) 100-10 MG/5ML syrup Take 5 mLs by mouth 3 (three) times daily as needed. 120 mL 0   ipratropium-albuterol (DUONEB) 0.5-2.5 (3) MG/3ML SOLN Use 1 vial in nebulizer every 6 (six) hours as needed for shortness of breath 3 mL 11   levocetirizine (XYZAL) 5 MG tablet Take 5 mg by mouth daily as needed for allergies.     megestrol (MEGACE) 20 MG tablet Take 1 tablet (20 mg total) by mouth daily.  90 tablet 2   Multiple Vitamin (MULTIVITAMIN WITH MINERALS) TABS tablet Take 1 tablet by mouth daily.     naloxegol oxalate (MOVANTIK) 12.5 MG TABS tablet Take 1 tablet (12.5 mg total) by mouth daily. 30 tablet 5   naloxone (NARCAN) nasal spray 4 mg/0.1 mL Place 1 spray into the nose once.     nitroGLYCERIN (NITROSTAT) 0.4 MG SL tablet Place 1 tablet (0.4 mg total) under the tongue every 5 (five) minutes as needed for chest pain. 25 tablet 6   ondansetron (ZOFRAN) 4 MG tablet Take 1 tablet (4 mg total) by mouth every 8 (eight) hours as needed for nausea or vomiting. 20 tablet 0   oxyCODONE (ROXICODONE) 15 MG immediate release tablet Take 15 mg by mouth 3 (three) times daily as needed for pain.      predniSONE (DELTASONE) 10 MG tablet Take 4 tablets (40 mg total) by mouth daily for 5 days. 20 tablet 0   ranolazine (RANEXA) 500 MG 12 hr tablet TAKE 1 TABLET(500 MG) BY MOUTH TWICE DAILY (Patient taking differently: Take 500 mg by mouth 2 (two) times daily. TAKE 1 TABLET(500 MG) BY MOUTH TWICE DAILY) 180 tablet 3   rosuvastatin (CRESTOR) 20 MG tablet Take 1 tablet (20 mg total) by mouth daily. 90 tablet 1   sacubitril-valsartan (ENTRESTO) 24-26 MG Take 1 tablet by mouth 2 (two) times daily. 180 tablet 2   sertraline (ZOLOFT) 50 MG tablet TAKE 1 TABLET BY MOUTH DAILY (Patient taking differently: Take 50 mg by mouth daily.) 90 tablet 2   tiZANidine (ZANAFLEX) 4 MG tablet Take 4 mg by mouth at bedtime.     topiramate (TOPAMAX) 50 MG tablet TAKE 2 TABLETS(100 MG) BY MOUTH AT BEDTIME (Patient taking differently: Take 100 mg by mouth at bedtime. TAKE 2 TABLETS(100 MG) BY MOUTH AT BEDTIME) 180 tablet 0   Ubrogepant (UBRELVY) 100 MG TABS Take 100 mg by mouth as needed (May repeat after 2 hours.  Maximum 2 tablets in 24 hours). TAKE 1 TABLET BY MOUTH BY MOUTH AS NEEDED( MAY REPEAT 1 TABLET AFTER 2 HOURS IF NEEDED, MAXIMUM 2 TABLETS IN 24 HOURS) Strength: 100 mg 16 tablet 5   Current Facility-Administered Medications on File Prior to Visit  Medication Dose Route Frequency Provider Last Rate Last Admin   lidocaine (LIDODERM) 5 % 1 patch  1 patch Transdermal Q24H Suella Broad A, PA-C   1 patch at 07/04/22 1841   Past Medical History:  Diagnosis Date   Abdominal pain 10/17/2020   Abnormality of gait due to impairment of balance 11/22/2020   Acute diverticulitis 10/17/2020   Admission for long-term opiate analgesic use 10/24/2019   Arthritis of right hip 05/29/2016   Formatting of this note might be different from the original. Added automatically from request for surgery [id number]   BMI 26.0-26.9,adult 03/29/2020   Bronchial asthma    Cardiomyopathy ([place])    Overview:  Ejection fraction 45% in 2015  Ejection fraction 30 to 35% in November 2018   Chronic back pain    Chronic constipation 07/31/2021   Chronic hypoxemic respiratory failure ([place]) 10/24/2019   Chronic narcotic use 03/23/2015   Chronic pain of both knees 12/21/2018   Added automatically from request for surgery [id number]  Formatting of this note might be different from the original. 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   Ileus following gastrointestinal surgery ([place]) 03/26/2015   Major depressive disorder, single episode, moderate ([place]) 10/24/2019   Malnutrition of moderate degree ([place]) 10/20/2020   Mesenteric ischemia ([place]) 10/16/2020   Migraine without aura with status migrainosus 10/24/2019   Mixed incontinence 10/20/2020   Myocardial infarction (HCC)    Nausea & vomiting    Obstructive chronic bronchitis with exacerbation ([place]) 10/25/2019   Other spondylosis with radiculopathy, lumbar region 10/24/2019   Persistent vomiting 05/17/2020   Presence of left artificial hip joint 10/24/2019   PTSD (post-traumatic stress disorder)    Recurrent incisional hernias with incarceration s/p lap repair w  mesh 03/23/2015 04/19/2014   Senile osteoporosis 10/24/2019   Sepsis ([place]) 10/17/2020   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 Small internal hemrrhoids. Otherwise normal colonoscopy   COLONOSCOPY WITH PROPOFOL N/A 09/27/2021   Procedure: COLONOSCOPY WITH PROPOFOL;  Surgeon: Mauri Pole, MD;  Location: [facility];  Service: Endoscopy;  Laterality: N/A;   CORONARY ANGIOPLASTY     ESOPHAGOGASTRODUODENOSCOPY  02/04/2014   Mild gastritis. Otherwise normal EGD   HAND SURGERY     ICD GENERATOR CHANGEOUT N/A 05/21/2019   Procedure: ICD GENERATOR CHANGEOUT;  Surgeon: Constance Haw, MD;  Location: [facility];  Service: Cardiovascular;  Laterality: N/A;   ICD IMPLANT     Medtronic   intestinal blockage 2011     LAPAROSCOPIC ASSISTED VENTRAL HERNIA REPAIR N/A 03/23/2015   Procedure: LAPAROSCOPIC VENTRAL WALL HERNIA REPAIR;  Surgeon: Michael Boston, MD;  Location: [facility];  Service: General;  Laterality: N/A;  With MESH   LAPAROSCOPIC LYSIS OF ADHESIONS N/A 03/23/2015   Procedure: LAPAROSCOPIC LYSIS OF ADHESIONS;  Surgeon: Michael Boston, MD;  Location: [facility];  Service: General;  Laterality: N/A;   LSCS      x2   NASAL SEPTUM SURGERY     NECK SURGERY     fused   TONSILLECTOMY     ULNAR NERVE TRANSPOSITION  01/23/2012   Procedure: ULNAR NERVE DECOMPRESSION/TRANSPOSITION;  Surgeon: Ophelia Charter, MD;  Location: [facility];  Service: Neurosurgery;  Laterality: Left;  LEFT ulnar nerve decompression    Family History   Problem Relation Age of Onset   Hypertension Mother    Heart attack Mother    Alcohol abuse Father    Hypertension Father    Heart attack Father    Heart attack  Other    Cancer Other    Heart failure Other    Anesthesia problems Neg Hx    Hypotension Neg Hx    Malignant hyperthermia Neg Hx    Pseudochol deficiency Neg Hx    Breast cancer Neg Hx    Social History   Socioeconomic History   Marital status: Single    Spouse name: Not on file   Number of children: 3   Years of education: Not on file   Highest education level: Not on file  Occupational History   Occupation: disabled  Tobacco Use   Smoking status: Former    Packs/day: 0.50    Years: 30.00    Total pack years: 15.00    Types: Cigarettes    Quit date: 03/2020    Years since quitting: 2.2   Smokeless tobacco: Never  Vaping Use   Vaping Use: Never used  Substance and Sexual Activity   Alcohol use: Yes    Alcohol/week: 2.0 standard drinks of alcohol    Types: 1 Cans of beer, 1 Standard drinks or equivalent per week    Comment: seldom   Drug use: No   Sexual activity: Not Currently  Other Topics Concern   Not on file  Social History Narrative   One level home with boyfriend   Caffeine - coffee 1-2 cups/day; Green tea 4-5 bottles a day   Exercise - some    Right handed      Social Determinants of Health   Financial Resource Strain: Low Risk  (03/27/2021)   Overall Financial Resource Strain (CARDIA)    Difficulty of Paying Living Expenses: Not hard at all  Food Insecurity: No Food Insecurity (10/31/2020)   Hunger Vital Sign    Worried About Running Out of Food in the Last Year: Never true    Ran Out of Food in the Last Year: Never true  Transportation Needs: Unmet Transportation Needs (03/27/2021)   PRAPARE - Hydrologist (Medical): Not on file    Lack of Transportation (Non-Medical): Yes  Physical Activity: Inactive (03/27/2021)   Exercise Vital Sign    Days of Exercise per  Week: 0 days    Minutes of Exercise per Session: 0 min  Stress: Stress Concern Present (03/27/2021)   [place]    Feeling of Stress : Rather much  Social Connections: Moderately Integrated (03/27/2021)   Social Connection and Isolation Panel [NHANES]    Frequency of Communication with Friends and Family: More than three times a week    Frequency of Social Gatherings with Friends and Family: Never    Attends Religious Services: 1 to 4 times per year    Active Member of [employer] or Organizations: No    Attends Archivist Meetings: Never    Marital Status: Married    Review of Systems  Constitutional:  Negative for chills, fatigue and fever.  HENT:  Negative for congestion, ear pain and sore throat.   Respiratory:  Negative for cough and shortness of breath.   Cardiovascular:  Negative for chest pain and palpitations.  Gastrointestinal:  Negative for abdominal pain, constipation, diarrhea, nausea and vomiting.  Endocrine: Negative for polydipsia, polyphagia and polyuria.  Genitourinary:  Negative for difficulty urinating and dysuria.  Musculoskeletal:  Negative for arthralgias, back pain and myalgias.  Skin:  Negative for rash.  Neurological:  Negative for headaches.  Psychiatric/Behavioral:  Negative for dysphoric mood. The patient is not nervous/anxious.  Objective:  There were no vitals taken for this visit.     07/04/2022    5:41 PM 07/04/2022    2:33 PM 07/03/2022    9:00 PM  BP/Weight  Systolic BP 356 701 93  Diastolic BP 74 80 76    Physical Exam  Diabetic Foot Exam - Simple   No data filed      Lab Results  Component Value Date   WBC 9.5 07/03/2022   HGB 10.3 (L) 07/03/2022   HCT 32.6 (L) 07/03/2022   PLT 301 07/03/2022   GLUCOSE 115 (H) 07/03/2022   CHOL 154 03/20/2022   TRIG 220 (H) 03/20/2022   HDL 52 03/20/2022   LDLCALC 66 03/20/2022   ALT 7 03/20/2022   AST 16 03/20/2022    NA 138 07/03/2022   K 4.4 07/03/2022   CL 110 07/03/2022   CREATININE 1.92 (H) 07/03/2022   BUN 32 (H) 07/03/2022   CO2 19 (L) 07/03/2022   TSH 0.816 02/15/2021   INR 1.0 10/18/2020      Assessment & Plan:   Problem List Items Addressed This Visit       Cardiovascular and Mediastinum   Chronic systolic congestive heart failure, NYHA class 2 (HCC)   Dilated cardiomyopathy ([place]) - Primary     Digestive   GERD (gastroesophageal reflux disease)     Other   Dyslipidemia   Major depressive disorder, single episode, moderate (HCC)   Mixed incontinence  .  No orders of the defined types were placed in this encounter.   No orders of the defined types were placed in this encounter.    Follow-up: No follow-ups on file.  An After Visit Summary was printed and given to the patient.  Rochel Brome, MD [facility] [phone]

## 2022-07-04 NOTE — ED Triage Notes (Signed)
Pt reports SHOB w/ cough & dizziness. Was seen yesterday for same however left due to late.

## 2022-07-04 NOTE — ED Provider Notes (Signed)
[place] DEPT Provider Note   CSN: [id number] Arrival date & time: 07/04/22  1417     History  Chief Complaint  Patient presents with   Shortness of Breath    PRIMA RAYNER is a 66 y.o. female.  67 year old female presents with ongoing cough and congestion. Reports onset of symptoms just prior to 06/24/22, went to PCP who prescribed antibiotics after a negative COVID test. Symptoms did not improve, became worse after she attended a football game, questioned if she had developed PNA and came to the ER for a CXR. Patient was evaluated in triage yesterday, orders entered and completed but left after extensive wait time. Returns today with ongoing SHOB, cough, congestion. Denies history of COPD, uses inhalers daily. Medical history listed includes COPD, GERD, dilated cardiomyopathy, CAD, CHF, ICD/MI.      +  Home Medications Prior to Admission medications   Medication Sig Start Date End Date Taking? Authorizing Provider  benzonatate (TESSALON) 200 MG capsule Take 1 capsule (200 mg total) by mouth every 8 (eight) hours for 10 days. 07/04/22 07/14/22 Yes Tacy Learn, PA-C  predniSONE (DELTASONE) 10 MG tablet Take 4 tablets (40 mg total) by mouth daily for 5 days. 07/04/22 07/09/22 Yes Tacy Learn, PA-C  amoxicillin-clavulanate (AUGMENTIN) 875-125 MG tablet Take 1 tablet by mouth 2 (two) times daily. 06/25/22   Lillard Anes, MD  ARIPiprazole (ABILIFY) 2 MG tablet Take 1 tablet (2 mg total) by mouth daily. 10/18/21   Lillard Anes, MD  aspirin 81 MG tablet Take 81 mg by mouth daily.    [provider]  BREO ELLIPTA 100-25 MCG/ACT AEPB INHALE 1 PUFF BY MOUTH INTO LUNGS DAILY in THE afternoon Patient taking differently: 1 puff 2 (two) times daily. 07/25/21   Lillard Anes, MD  Buprenorphine HCl (BELBUCA) 900 MCG FILM Place 900 mcg inside cheek every 12 (twelve) hours.    [provider]  carvedilol (COREG)  3.125 MG tablet Take 1 tablet (3.125 mg total) by mouth 2 (two) times daily with a meal. 04/16/22   Lillard Anes, MD  celecoxib (CELEBREX) 200 MG capsule Take 1 capsule (200 mg total) by mouth every morning. 04/16/22   Lillard Anes, MD  denosumab (PROLIA) 60 MG/ML SOSY injection Inject 60 mg into the skin every 6 (six) months. 02/14/20   Lillard Anes, MD  diazepam (VALIUM) 5 MG tablet TAKE ONE TABLET BY MOUTH THREE TIMES DAILY AS NEEDED FOR ANXIETY 01/19/22   Lillard Anes, MD  diclofenac Sodium (VOLTAREN) 1 % GEL Apply 2 g topically 4 (four) times daily. Patient taking differently: Apply 2 g topically daily as needed (Pain). 05/01/21   Lillard Anes, MD  EPINEPHRINE 0.3 mg/0.3 mL IJ SOAJ injection Inject 0.3 mLs (0.3 mg total) into the muscle as needed for anaphylaxis. 04/27/20   Lillard Anes, MD  Erenumab-aooe (AIMOVIG) 140 MG/ML SOAJ Inject '140mg'$  into THE SKIN every 28 DAYS 07/01/22   Pieter Partridge, DO  esomeprazole (NEXIUM) 20 MG capsule Take 1 capsule (20 mg total) by mouth daily. 04/22/22   Cox, Elnita Maxwell, MD  fluticasone (FLONASE) 50 MCG/ACT nasal spray Place 2 sprays into both nostrils daily as needed (congestion). 04/16/22   Lillard Anes, MD  furosemide (LASIX) 20 MG tablet Take 1 tablet (20 mg total) by mouth 2 (two) times daily. 04/16/22   Lillard Anes, MD  gabapentin (NEURONTIN) 800 MG tablet Take 1 tablet (800 mg  total) by mouth 3 (three) times daily. Patient usually takes twice a day Patient taking differently: Take 800 mg by mouth 3 (three) times daily. 01/21/20   Lillard Anes, MD  guaiFENesin-codeine [facility]) 100-10 MG/5ML syrup Take 5 mLs by mouth 3 (three) times daily as needed. 06/25/22   Lillard Anes, MD  ipratropium-albuterol (DUONEB) 0.5-2.5 (3) MG/3ML SOLN Use 1 vial in nebulizer every 6 (six) hours as needed for shortness of breath 02/15/22   Lillard Anes, MD  levocetirizine  (XYZAL) 5 MG tablet Take 5 mg by mouth daily as needed for allergies. 12/13/19   [provider]  megestrol (MEGACE) 20 MG tablet Take 1 tablet (20 mg total) by mouth daily. 10/18/21   Lillard Anes, MD  Multiple Vitamin (MULTIVITAMIN WITH MINERALS) TABS tablet Take 1 tablet by mouth daily. 10/30/20   Eugenie Filler, MD  naloxegol oxalate (MOVANTIK) 12.5 MG TABS tablet Take 1 tablet (12.5 mg total) by mouth daily. 07/31/21   Zehr, Laban Emperor, PA-C  naloxone (NARCAN) nasal spray 4 mg/0.1 mL Place 1 spray into the nose once. 01/12/21   [provider]  nitroGLYCERIN (NITROSTAT) 0.4 MG SL tablet Place 1 tablet (0.4 mg total) under the tongue every 5 (five) minutes as needed for chest pain. 11/27/21   Park Liter, MD  ondansetron (ZOFRAN) 4 MG tablet Take 1 tablet (4 mg total) by mouth every 8 (eight) hours as needed for nausea or vomiting. 05/17/20   Lillard Anes, MD  oxyCODONE (ROXICODONE) 15 MG immediate release tablet Take 15 mg by mouth 3 (three) times daily as needed for pain. 08/29/21   [provider]  ranolazine (RANEXA) 500 MG 12 hr tablet TAKE 1 TABLET(500 MG) BY MOUTH TWICE DAILY Patient taking differently: Take 500 mg by mouth 2 (two) times daily. TAKE 1 TABLET(500 MG) BY MOUTH TWICE DAILY 07/06/21   Park Liter, MD  rosuvastatin (CRESTOR) 20 MG tablet Take 1 tablet (20 mg total) by mouth daily. 04/22/22   Cox, Kirsten, MD  sacubitril-valsartan (ENTRESTO) 24-26 MG Take 1 tablet by mouth 2 (two) times daily. 04/16/22   Lillard Anes, MD  sertraline (ZOLOFT) 50 MG tablet TAKE 1 TABLET BY MOUTH DAILY Patient taking differently: Take 50 mg by mouth daily. 12/14/21   Lillard Anes, MD  tiZANidine (ZANAFLEX) 4 MG tablet Take 4 mg by mouth at bedtime. 11/09/20   [provider]  topiramate (TOPAMAX) 50 MG tablet TAKE 2 TABLETS(100 MG) BY MOUTH AT BEDTIME Patient taking differently: Take 100 mg by mouth at bedtime. TAKE 2  TABLETS(100 MG) BY MOUTH AT BEDTIME 04/16/22   Lillard Anes, MD  Ubrogepant (UBRELVY) 100 MG TABS Take 100 mg by mouth as needed (May repeat after 2 hours.  Maximum 2 tablets in 24 hours). TAKE 1 TABLET BY MOUTH BY MOUTH AS NEEDED( MAY REPEAT 1 TABLET AFTER 2 HOURS IF NEEDED, MAXIMUM 2 TABLETS IN 24 HOURS) Strength: 100 mg 10/15/21   Pieter Partridge, DO      Allergies    Patient has no known allergies.    Review of Systems   Review of Systems Negative except as per HPI Physical Exam Updated Vital Signs BP 119/74 (BP Location: Left Arm)   Pulse 84   Temp 98.7 F (37.1 C)   Resp 16   SpO2 100%  Physical Exam Vitals and nursing note reviewed.  Constitutional:      General: She is not in acute distress.  Appearance: She is well-developed. She is not diaphoretic.  HENT:     Head: Normocephalic and atraumatic.  Cardiovascular:     Rate and Rhythm: Normal rate and regular rhythm.  Pulmonary:     Effort: Pulmonary effort is normal.     Breath sounds: Normal breath sounds. No decreased breath sounds or wheezing.  Chest:     Chest wall: No tenderness.  Musculoskeletal:     Cervical back: No tenderness or bony tenderness.     Thoracic back: Tenderness present. No bony tenderness.     Lumbar back: No tenderness or bony tenderness.       Back:     Right lower leg: No tenderness. No edema.     Left lower leg: No tenderness. No edema.  Skin:    General: Skin is warm and dry.     Findings: No erythema or rash.  Neurological:     Mental Status: She is alert and oriented to person, place, and time.  Psychiatric:        Behavior: Behavior normal.     ED Results / Procedures / Treatments   Labs (all labs ordered are listed, but only abnormal results are displayed) Labs Reviewed  TROPONIN I (HIGH SENSITIVITY) - Abnormal; Notable for the following components:      Result Value   Troponin I (High Sensitivity) 33 (*)    All other components within normal limits     EKG None  Radiology DG Chest 2 View  Result Date: 07/03/2022 CLINICAL DATA:  Chest pain, shortness of breath EXAM: CHEST - 2 VIEW COMPARISON:  06/12/2015 FINDINGS: Transverse diameter of heart is in the upper limits of normal. Lung fields are clear of any infiltrates or pulmonary edema. There is no pleural effusion or pneumothorax. Pacemaker battery is seen in the left infraclavicular region with tips of leads in right atrium and right ventricle. Surgical clips are seen in right upper quadrant. Degenerative changes are noted in the left shoulder. IMPRESSION: No active cardiopulmonary disease. Electronically Signed   By: Elmer Picker M.D.   On: 07/03/2022 19:44    Procedures Procedures    Medications Ordered in ED Medications  lidocaine (LIDODERM) 5 % 1 patch (1 patch Transdermal Patch Applied 07/04/22 1841)  ipratropium-albuterol (DUONEB) 0.5-2.5 (3) MG/3ML nebulizer solution 3 mL (3 mLs Nebulization Given 07/04/22 1728)  predniSONE (DELTASONE) tablet 60 mg (60 mg Oral Given 07/04/22 1728)    ED Course/ Medical Decision Making/ A&P                           Medical Decision Making Risk Prescription drug management.   This patient presents to the ED for concern of back pain, SHOB, cough, this involves an extensive number of treatment options, and is a complaint that carries with it a high risk of complications and morbidity.  The differential diagnosis includes but not limited to PNA, COPD, acute bronchitis, ACS, MSK pain    Co morbidities that complicate the patient evaluation  GERD, cardiomyopathy, dual ICD, MDD, COPD   Additional history obtained:  External records from outside source obtained and reviewed including labs, EKG, CXR obtained in the ER 07/03/22   Lab Tests:  I Ordered, and personally interpreted labs.  The pertinent results include:  troponin 33, not significantly changed compared to prior completed yesterday in the ER.    Imaging Studies  ordered:  I ordered imaging studies including CXR  I independently visualized and interpreted  imaging which showed no active disease  I agree with the radiologist interpretation   Cardiac Monitoring: / EKG:  The patient was maintained on a cardiac monitor.  I personally viewed and interpreted the cardiac monitored which showed an underlying rhythm of: sinus rhythm rate 87   Problem List / ED Course / Critical interventions / Medication management  66 yo female with complaint of pain in her back with cough and SHOB. Patient was seen by PCP, COVID negative, treated with antibiotics but did not improve, became worse and was advised to come to the ER for CXR for concern for PNA. Patient came to the ER yesterday, CXR negative, work up including EKG x 2 reassuring, left without being seen after extensive wait. Troponin and EKG unchanged today. Pain in back is reproduced with palpation along medial borders of scapula. Lungs CTA, no relief in symptoms with neb treatment and prednisone. Suspect pain caused my MSK source, treated with lidoderm patch. Will dc with prednisone for same. Recommend recheck with PCP, return to ER for worsening or concerning symptoms. Also provided with Tessalon for her cough.  I ordered medication including duoneb, prednisone  for [facility]  Reevaluation of the patient after these medicines showed that the patient stayed the same I have reviewed the patients home medicines and have made adjustments as needed   Social Determinants of Health:  Has PCP for follow up.    Test / Admission - Considered:  After review of labs, XR, EKG completed in ER yesterday, in combination with EKG, additional trop today, neb treatment, workup is reassuring, recommend follow up with PCP for recheck, return to ER for worsening or concerning symptoms.          Final Clinical Impression(s) / ED Diagnoses Final diagnoses:  Acute cough  Muscle spasm of back    Rx / DC Orders ED  Discharge Orders          Ordered    predniSONE (DELTASONE) 10 MG tablet  Daily        07/04/22 1832    benzonatate (TESSALON) 200 MG capsule  Every 8 hours        07/04/22 1833              Tacy Learn, PA-C 07/04/22 2108    Milton Ferguson, MD 07/06/22 1052

## 2022-07-04 NOTE — ED Provider Triage Note (Signed)
Emergency Medicine Provider Triage Evaluation Note  [name] , a 66 y.o. female  was evaluated in triage.  Pt complains of SOB, chest pain, congestion, intermittent nausea for the past few days.  She was evaluated in triage yesterday, but eloped.  She returns today with same complaints.  Denies fever, chills. Denies COPD history, but does use daily inhalers.    Review of Systems  Positive: See above Negative: See above  Physical Exam  BP 129/80 (BP Location: Left Arm)   Pulse 81   Temp 97.6 F (36.4 C) (Oral)   Resp 20   SpO2 96%  Gen:   Awake, no distress   Resp:  Normal effort, decreased breath sounds, pt unable to take deep breath MSK:   Moves extremities without difficulty   Medical Decision Making  Medically screening exam initiated at 2:44 PM.  Appropriate orders placed.  [name] Nest was informed that the remainder of the evaluation will be completed by another provider, this initial triage assessment does not replace that evaluation, and the importance of remaining in the ED until their evaluation is complete.     [name] R, [place] 07/04/22 1446

## 2022-07-04 NOTE — Discharge Instructions (Addendum)
Tessalon as needed as prescribed for cough. Take Prednisone as prescribed, start tomorrow. Follow up with your doctor for recheck. Return to ER for worsening or concerning symptoms.

## 2022-07-05 ENCOUNTER — Ambulatory Visit: Payer: 59 | Admitting: Family Medicine

## 2022-07-05 DIAGNOSIS — K2100 Gastro-esophageal reflux disease with esophagitis, without bleeding: Secondary | ICD-10-CM

## 2022-07-05 DIAGNOSIS — I5022 Chronic systolic (congestive) heart failure: Secondary | ICD-10-CM

## 2022-07-05 DIAGNOSIS — E785 Hyperlipidemia, unspecified: Secondary | ICD-10-CM

## 2022-07-05 DIAGNOSIS — I420 Dilated cardiomyopathy: Secondary | ICD-10-CM

## 2022-07-05 DIAGNOSIS — N3946 Mixed incontinence: Secondary | ICD-10-CM

## 2022-07-05 DIAGNOSIS — F321 Major depressive disorder, single episode, moderate: Secondary | ICD-10-CM

## 2022-07-11 ENCOUNTER — Telehealth: Payer: Self-pay

## 2022-07-11 NOTE — Progress Notes (Signed)
Chronic Care Management Pharmacy Assistant   Name: [name]  MRN: [id number] DOB: 1956-03-25   Reason for Encounter: Medication Coordination for [facility]    Recent office visits:  06/25/22 Reinaldo Meeker MD. Seen for cough. Started on Augmentin 875-'125mg'$  and Guaifenesin-Codeine 100-'10mg'$ /33m.   Recent consult visits:  05/07/22 (Cardiology[facility] Started on Cardiomyopathy. No med changes.  Hospital visits:  Medication Reconciliation was completed by comparing discharge summary, patient's EMR and Pharmacy list, and upon discussion with patient.  Admitted to the hospital on 07/03/22 due to Acute Cough. Discharge date was 07/04/22. [facility] -[facility] Prednisone '10mg'$  and Benzonatate '200mg'$    -All other medications will remain the same.    Medications: Outpatient Encounter Medications as of 07/11/2022  Medication Sig   amoxicillin-clavulanate (AUGMENTIN) 875-125 MG tablet Take 1 tablet by mouth 2 (two) times daily.   ARIPiprazole (ABILIFY) 2 MG tablet Take 1 tablet (2 mg total) by mouth daily.   aspirin 81 MG tablet Take 81 mg by mouth daily.   benzonatate (TESSALON) 200 MG capsule Take 1 capsule (200 mg total) by mouth every 8 (eight) hours for 10 days.   BREO ELLIPTA 100-25 MCG/ACT AEPB INHALE 1 PUFF BY MOUTH INTO LUNGS DAILY in THE afternoon (Patient taking differently: 1 puff 2 (two) times daily.)   Buprenorphine HCl (BELBUCA) 900 MCG FILM Place 900 mcg inside cheek every 12 (twelve) hours.   carvedilol (COREG) 3.125 MG tablet Take 1 tablet (3.125 mg total) by mouth 2 (two) times daily with a meal.   celecoxib (CELEBREX) 200 MG capsule Take 1 capsule (200 mg total) by mouth every morning.   denosumab (PROLIA) 60 MG/ML SOSY injection Inject 60 mg into the skin every 6 (six) months.   diazepam (VALIUM) 5 MG tablet TAKE ONE TABLET BY MOUTH THREE TIMES DAILY AS NEEDED FOR  ANXIETY   diclofenac Sodium (VOLTAREN) 1 % GEL Apply 2 g topically 4 (four) times daily. (Patient taking differently: Apply 2 g topically daily as needed (Pain).)   EPINEPHRINE 0.3 mg/0.3 mL IJ SOAJ injection Inject 0.3 mLs (0.3 mg total) into the muscle as needed for anaphylaxis.   Erenumab-aooe (AIMOVIG) 140 MG/ML SOAJ Inject '140mg'$  into THE SKIN every 28 DAYS   esomeprazole (NEXIUM) 20 MG capsule Take 1 capsule (20 mg total) by mouth daily.   fluticasone (FLONASE) 50 MCG/ACT nasal spray Place 2 sprays into both nostrils daily as needed (congestion).   furosemide (LASIX) 20 MG tablet Take 1 tablet (20 mg total) by mouth 2 (two) times daily.   gabapentin (NEURONTIN) 800 MG tablet Take 1 tablet (800 mg total) by mouth 3 (three) times daily. Patient usually takes twice a day (Patient taking differently: Take 800 mg by mouth 3 (three) times daily.)   guaiFENesin-codeine (CHERATUSSIN AC) 100-10 MG/5ML syrup Take 5 mLs by mouth 3 (three) times daily as needed.   ipratropium-albuterol (DUONEB) 0.5-2.5 (3) MG/3ML SOLN Use 1 vial in nebulizer every 6 (six) hours as needed for shortness of breath   levocetirizine (XYZAL) 5 MG tablet Take 5 mg by mouth daily as needed for allergies.   megestrol (MEGACE) 20 MG tablet Take 1 tablet (20 mg total) by mouth daily.   Multiple Vitamin (MULTIVITAMIN WITH MINERALS) TABS tablet Take 1 tablet by mouth daily.   naloxegol oxalate (MOVANTIK) 12.5 MG TABS tablet Take 1 tablet (12.5 mg total) by mouth daily.   naloxone (NARCAN) nasal spray  4 mg/0.1 mL Place 1 spray into the nose once.   nitroGLYCERIN (NITROSTAT) 0.4 MG SL tablet Place 1 tablet (0.4 mg total) under the tongue every 5 (five) minutes as needed for chest pain.   ondansetron (ZOFRAN) 4 MG tablet Take 1 tablet (4 mg total) by mouth every 8 (eight) hours as needed for nausea or vomiting.   oxyCODONE (ROXICODONE) 15 MG immediate release tablet Take 15 mg by mouth 3 (three) times daily as needed for pain.    ranolazine (RANEXA) 500 MG 12 hr tablet TAKE 1 TABLET(500 MG) BY MOUTH TWICE DAILY (Patient taking differently: Take 500 mg by mouth 2 (two) times daily. TAKE 1 TABLET(500 MG) BY MOUTH TWICE DAILY)   rosuvastatin (CRESTOR) 20 MG tablet Take 1 tablet (20 mg total) by mouth daily.   sacubitril-valsartan (ENTRESTO) 24-26 MG Take 1 tablet by mouth 2 (two) times daily.   sertraline (ZOLOFT) 50 MG tablet TAKE 1 TABLET BY MOUTH DAILY (Patient taking differently: Take 50 mg by mouth daily.)   tiZANidine (ZANAFLEX) 4 MG tablet Take 4 mg by mouth at bedtime.   topiramate (TOPAMAX) 50 MG tablet TAKE 2 TABLETS(100 MG) BY MOUTH AT BEDTIME (Patient taking differently: Take 100 mg by mouth at bedtime. TAKE 2 TABLETS(100 MG) BY MOUTH AT BEDTIME)   Ubrogepant (UBRELVY) 100 MG TABS Take 100 mg by mouth as needed (May repeat after 2 hours.  Maximum 2 tablets in 24 hours). TAKE 1 TABLET BY MOUTH BY MOUTH AS NEEDED( MAY REPEAT 1 TABLET AFTER 2 HOURS IF NEEDED, MAXIMUM 2 TABLETS IN 24 HOURS) Strength: 100 mg   No facility-administered encounter medications on file as of 07/11/2022.    Reviewed chart for medication changes ahead of medication coordination call.   BP Readings from Last 3 Encounters:  07/04/22 119/74  07/03/22 93/76  06/25/22 90/66    No results found for: "HGBA1C"   Patient obtains medications through Vials  90 Days   Last adherence delivery included:  Ranolazine ER '500mg'$ - 1 tab twice daily        Aripiprazole '2mg'$ - 1 tab once daily      Linzess 231mg- 1 capsule before breakfast  Breo ellipta 100-25 mg 1 puff daily Megestrol 20 mg daily          Esomeprazole '20mg'$ - 1 capsule once daily    Entresto 24-26 mg twice daily Celebrex 200 mg daily Rosuvastatin 20 mg daily Carvedilol 3.'125mg'$  1 tab twice daily  Diazepam 5 mg 3 times daily as needed Aspirin '81mg'$ - daily  Flonase Nasal Spray-Place 2 sprays into both nostrils daily as needed   Patient declined (meds) last delivery Aimovig '140mg'$ -  Not due until 05/10/22 Epinephrine 0.'3mg'$ - Has plenty on hand. Not expired   Patient is due for next adherence delivery on: 07/24/22. Called patient and reviewed medications and coordinated delivery.  This delivery to include: Ranolazine ER '500mg'$ - 1 tab twice daily        Aripiprazole '2mg'$ - 1 tab once daily   Aimovig Injection '140mg'$ -inject 140 mg once a month  Breo ellipta 100-25 mg 1 puff daily Megestrol 20 mg daily          Esomeprazole '20mg'$ - 1 capsule once daily    Entresto 24-26 mg twice daily Celebrex 200 mg daily Rosuvastatin 20 mg daily Furosemide '20mg'$ -1 tab twice daily  Topiramate '50mg'$ -2 tabs everyday at bedtime  Carvedilol 3.'125mg'$  1 tab twice daily  Diazepam 5 mg 3 times daily as needed Aspirin '81mg'$ - daily  Flonase Nasal Spray-Place 2 sprays  into both nostrils daily as needed   Patient declined the following medications  Unable to reach pt to complete this call   Patient needs refills-Request sent  Megestrol 20 mg Aripiprazole '2mg'$  Breo ellipta  Diazepam 5 mg  Flonase Nasal Spray  Delivery not confirmed, unable to reach pt   Elray Mcgregor, [place] Pharmacist Assistant  [phone]

## 2022-07-11 NOTE — Progress Notes (Deleted)
NEUROLOGY FOLLOW UP OFFICE NOTE  [name] [id number]  Assessment/Plan:   Migraine without aura, without status migrainosus, not intractable     1.Migraine prevention:  Aimovig '140mg'$  every 28 days; topiramate '100mg'$  QHS 2.Migraine rescue:  Roselyn Meier '100mg'$  3.Limit use of pain relievers to no more than 2 days out of week to prevent risk of rebound or medication-overuse headache. 4. Keep headache diary 5. Follow up 9 months.  Subjective:   [name] is a 66 year old Caucasian woman with CHF with EF 30-35%, s/p AICD, COPD, chronic back pain, chronic knee pain, arthritis, HTN, hyperlipidemia and PTSD and history of MI and former smoker and history of childhood seizures who follows up for migraines and dizziness.   UPDATE:  Intensity:  severe Duration:  1 hour if takes Ubrelvy immediately.   Frequency:  once a month Frequency of abortive medication:once a month Current NSAIDS:  ASA '81mg'$ ; Celebrex Current analgesics:  oxycodone (for back pain - takes almost), Belbuca (for back pain) Current triptans:  none Current ergotamine:  none Current anti-emetic:  none Current muscle relaxants:  none Current anti-anxiolytic:  Valium Current sleep aide:  none Current Antihypertensive medications:  Coreg, Lasix Current Antidepressant medications:  sertraline Current Anticonvulsant medications:  Topiramate '100mg'$  at bedtime; gabapentin '800mg'$  twice daily Current anti-CGRP:  Aimovig '140mg'$ , Ubrelvy '100mg'$  Current Vitamins/Herbal/Supplements:  none Current Antihistamines/Decongestants:  Flonase Other therapy:  none Hormone/birth control:  none   Caffeine:  1 cup of coffee daily Alcohol:  no Smoker: former Diet: 100 oz water daily.  No soda. Exercise: no Pain:  back pain, chronic   HISTORY: She has had headaches since around 2017 but gradually got worse, particularly since November 2019.  They are severe bifrontal pounding/squeezing headache.  There is no aura.  They are associated  with photophobia, phonophobia, blurred vision and dizziness.  They typically last 1 hour and occur 5 to 6 a day every day.  They are triggered by loud noise.  Sumatriptan helps relieve the intensity but does not abort it..   Since November 2019, she also has had episodes of dizziness outside of the dizziness as well, causing her to fall.  She reports sudden spinning sensation lasting a few seconds and resolves.  She says it is not positional.  She has associated nausea and blurred vision.  She says it occurs 2 to 3 times a day.     She had a CT of head without contrast on 11/02/18 which was personally reviewed and was unremarkable, demonstrating mild atrophy and mild chronic small vessel ischemic changes but no acute intracranial abnormality.   She is treated by pain management for chronic pain.  She has degenerative disc disease of the cervical spine with previous neck fusion.  She has history of total right hip replacement.  She has chronic low back pain with radicular symptoms.  She has bilateral knee pain.   Past NSAIDS:  none Past analgesics:  Tylenol (contraindicated due to CHF) Past abortive triptans: sumatriptan '25mg'$  Past abortive ergotamine:  none Past muscle relaxants:  none Past anti-emetic:  none Past antihypertensive medications:  lisinopril Past antidepressant medications:  none Past anticonvulsant medications:  none Past anti-CGRP:  none Past vitamins/Herbal/Supplements:  none Past antihistamines/decongestants:  none Other past therapies:  none  PAST MEDICAL HISTORY: Past Medical History:  Diagnosis Date   Abdominal pain 10/17/2020   Abnormality of gait due to impairment of balance 11/22/2020   Acute diverticulitis 10/17/2020   Admission for long-term opiate analgesic use 10/24/2019  Arthritis of right hip 05/29/2016   Formatting of this note might be different from the original. Added automatically from request for surgery [id number]   BMI 26.0-26.9,adult 03/29/2020    Bronchial asthma    Cardiomyopathy ([place])    Overview:  Ejection fraction 45% in 2015 Ejection fraction 30 to 35% in November 2018   Chronic back pain    Chronic constipation 07/31/2021   Chronic hypoxemic respiratory failure ([place]) 10/24/2019   Chronic narcotic use 03/23/2015   Chronic pain of both knees 12/21/2018   Added automatically from request for surgery [id number]  Formatting of this note might be different from the original. 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   Dyslipidemia 05/31/2015   GERD (gastroesophageal reflux disease)    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[place]) 07/13/2020   Hypotension 05/17/2020   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   Ileus following gastrointestinal surgery ([place]) 03/26/2015   Major depressive disorder, single episode, moderate ([place]) 10/24/2019   Malnutrition of moderate degree ([place]) 10/20/2020   Mesenteric ischemia ([place]) 10/16/2020   Migraine without aura with status migrainosus 10/24/2019   Mixed incontinence 10/20/2020   Myocardial infarction (HCC)    Nausea & vomiting    Obstructive chronic bronchitis with exacerbation ([place]) 10/25/2019   Other spondylosis with radiculopathy, lumbar region 10/24/2019   Persistent vomiting 05/17/2020   Presence of left artificial hip joint 10/24/2019   PTSD (post-traumatic  stress disorder)    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 03/23/2015 04/19/2014   Senile osteoporosis 10/24/2019   Sepsis ([place]) 10/17/2020   Serosanguineous chronic otitis media of right ear 08/02/2021   Small bowel obstruction (HCC)    Thyroid disease    Upper respiratory tract infection due to COVID-19 virus 07/06/2021   Wellness examination 03/27/2021    MEDICATIONS: Current Outpatient Medications on File Prior to Visit  Medication Sig Dispense Refill   amoxicillin-clavulanate (AUGMENTIN) 875-125 MG tablet Take 1 tablet by mouth 2 (two) times daily. 20 tablet 0   ARIPiprazole (ABILIFY) 2 MG tablet Take 1 tablet (2 mg total) by mouth daily. 90 tablet 2   aspirin 81 MG tablet Take 81 mg by mouth daily.     benzonatate (TESSALON) 200 MG capsule Take 1 capsule (200 mg total) by mouth every 8 (eight) hours for 10 days. 30 capsule 0   BREO ELLIPTA 100-25 MCG/ACT AEPB INHALE 1 PUFF BY MOUTH INTO LUNGS DAILY in THE afternoon (Patient taking differently: 1 puff 2 (two) times daily.) 180 each 2   Buprenorphine HCl (BELBUCA) 900 MCG FILM Place 900 mcg inside cheek every 12 (twelve) hours.     carvedilol (COREG) 3.125 MG tablet Take 1 tablet (3.125 mg total) by mouth 2 (two) times daily with a meal. 180 tablet 2   celecoxib (CELEBREX) 200 MG capsule Take 1 capsule (200 mg total) by mouth every morning. 90 capsule 2   denosumab (PROLIA) 60 MG/ML SOSY injection  Inject 60 mg into the skin every 6 (six) months. 180 mL 2   diazepam (VALIUM) 5 MG tablet TAKE ONE TABLET BY MOUTH THREE TIMES DAILY AS NEEDED FOR ANXIETY 90 tablet 3   diclofenac Sodium (VOLTAREN) 1 % GEL Apply 2 g topically 4 (four) times daily. (Patient taking differently: Apply 2 g topically daily as needed (Pain).) 50 g 4   EPINEPHRINE 0.3 mg/0.3 mL IJ SOAJ injection Inject 0.3 mLs (0.3 mg total) into the muscle as needed for anaphylaxis. 1 each 2   Erenumab-aooe (AIMOVIG) 140 MG/ML SOAJ Inject '140mg'$  into THE  SKIN every 28 DAYS 1 mL 0   esomeprazole (NEXIUM) 20 MG capsule Take 1 capsule (20 mg total) by mouth daily. 90 capsule 1   fluticasone (FLONASE) 50 MCG/ACT nasal spray Place 2 sprays into both nostrils daily as needed (congestion). 18 mL 2   furosemide (LASIX) 20 MG tablet Take 1 tablet (20 mg total) by mouth 2 (two) times daily. 180 tablet 1   gabapentin (NEURONTIN) 800 MG tablet Take 1 tablet (800 mg total) by mouth 3 (three) times daily. Patient usually takes twice a day (Patient taking differently: Take 800 mg by mouth 3 (three) times daily.) 270 tablet 2   guaiFENesin-codeine (CHERATUSSIN AC) 100-10 MG/5ML syrup Take 5 mLs by mouth 3 (three) times daily as needed. 120 mL 0   ipratropium-albuterol (DUONEB) 0.5-2.5 (3) MG/3ML SOLN Use 1 vial in nebulizer every 6 (six) hours as needed for shortness of breath 3 mL 11   levocetirizine (XYZAL) 5 MG tablet Take 5 mg by mouth daily as needed for allergies.     megestrol (MEGACE) 20 MG tablet Take 1 tablet (20 mg total) by mouth daily. 90 tablet 2   Multiple Vitamin (MULTIVITAMIN WITH MINERALS) TABS tablet Take 1 tablet by mouth daily.     naloxegol oxalate (MOVANTIK) 12.5 MG TABS tablet Take 1 tablet (12.5 mg total) by mouth daily. 30 tablet 5   naloxone (NARCAN) nasal spray 4 mg/0.1 mL Place 1 spray into the nose once.     nitroGLYCERIN (NITROSTAT) 0.4 MG SL tablet Place 1 tablet (0.4 mg total) under the tongue every 5 (five) minutes as needed for chest pain. 25 tablet 6   ondansetron (ZOFRAN) 4 MG tablet Take 1 tablet (4 mg total) by mouth every 8 (eight) hours as needed for nausea or vomiting. 20 tablet 0   oxyCODONE (ROXICODONE) 15 MG immediate release tablet Take 15 mg by mouth 3 (three) times daily as needed for pain.     ranolazine (RANEXA) 500 MG 12 hr tablet TAKE 1 TABLET(500 MG) BY MOUTH TWICE DAILY (Patient taking differently: Take 500 mg by mouth 2 (two) times daily. TAKE 1 TABLET(500 MG) BY MOUTH TWICE DAILY) 180 tablet 3   rosuvastatin  (CRESTOR) 20 MG tablet Take 1 tablet (20 mg total) by mouth daily. 90 tablet 1   sacubitril-valsartan (ENTRESTO) 24-26 MG Take 1 tablet by mouth 2 (two) times daily. 180 tablet 2   sertraline (ZOLOFT) 50 MG tablet TAKE 1 TABLET BY MOUTH DAILY (Patient taking differently: Take 50 mg by mouth daily.) 90 tablet 2   tiZANidine (ZANAFLEX) 4 MG tablet Take 4 mg by mouth at bedtime.     topiramate (TOPAMAX) 50 MG tablet TAKE 2 TABLETS(100 MG) BY MOUTH AT BEDTIME (Patient taking differently: Take 100 mg by mouth at bedtime. TAKE 2 TABLETS(100 MG) BY MOUTH AT BEDTIME) 180 tablet 0   Ubrogepant (UBRELVY) 100 MG TABS Take 100 mg by  mouth as needed (May repeat after 2 hours.  Maximum 2 tablets in 24 hours). TAKE 1 TABLET BY MOUTH BY MOUTH AS NEEDED( MAY REPEAT 1 TABLET AFTER 2 HOURS IF NEEDED, MAXIMUM 2 TABLETS IN 24 HOURS) Strength: 100 mg 16 tablet 5   No current facility-administered medications on file prior to visit.    ALLERGIES: No Known Allergies  FAMILY HISTORY: Family History  Problem Relation Age of Onset   Hypertension Mother    Heart attack Mother    Alcohol abuse Father    Hypertension Father    Heart attack Father    Heart attack Other    Cancer Other    Heart failure Other    Anesthesia problems Neg Hx    Hypotension Neg Hx    Malignant hyperthermia Neg Hx    Pseudochol deficiency Neg Hx    Breast cancer Neg Hx       Objective:  *** General: No acute distress.  Patient appears well-groomed.   Head:  Normocephalic/atraumatic Eyes:  Fundi examined but not visualized Neck: supple, no paraspinal tenderness, full range of motion Heart:  Regular rate and rhythm Neurological Exam: alert and oriented to person, place, and time.  Speech fluent and not dysarthric, language intact.  CN II-XII intact. Bulk and tone normal, muscle strength 5/5 throughout.  Sensation to light touch intact.  Deep tendon reflexes 2+ throughout, toes downgoing.  Finger to nose testing intact.  Gait normal,  Romberg negative.   Metta Clines, DO  CC: Reinaldo Meeker, MD

## 2022-07-15 ENCOUNTER — Ambulatory Visit: Payer: 59 | Admitting: Neurology

## 2022-07-16 ENCOUNTER — Other Ambulatory Visit: Payer: Self-pay | Admitting: Legal Medicine

## 2022-07-16 ENCOUNTER — Other Ambulatory Visit: Payer: Self-pay

## 2022-07-16 DIAGNOSIS — E440 Moderate protein-calorie malnutrition: Secondary | ICD-10-CM

## 2022-07-16 DIAGNOSIS — F321 Major depressive disorder, single episode, moderate: Secondary | ICD-10-CM

## 2022-07-16 DIAGNOSIS — J9611 Chronic respiratory failure with hypoxia: Secondary | ICD-10-CM

## 2022-07-16 DIAGNOSIS — F5101 Primary insomnia: Secondary | ICD-10-CM

## 2022-07-16 MED ORDER — DIAZEPAM 5 MG PO TABS: 5.0000 mg | ORAL_TABLET | Freq: Three times a day (TID) | ORAL | 3 refills | Status: DC | PRN

## 2022-07-16 MED ORDER — ARIPIPRAZOLE 2 MG PO TABS: 2.0000 mg | ORAL_TABLET | Freq: Every day | ORAL | 2 refills | Status: DC

## 2022-07-16 MED ORDER — MEGESTROL ACETATE 20 MG PO TABS: 20.0000 mg | ORAL_TABLET | Freq: Every day | ORAL | 2 refills | Status: DC

## 2022-07-16 MED ORDER — FLUTICASONE FUROATE-VILANTEROL 200-25 MCG/ACT IN AEPB: 1.0000 | INHALATION_SPRAY | Freq: Every day | RESPIRATORY_TRACT | 5 refills | Status: DC

## 2022-07-16 MED ORDER — FLUTICASONE PROPIONATE 50 MCG/ACT NA SUSP: 2.0000 | Freq: Every day | NASAL | 2 refills | Status: DC | PRN

## 2022-07-17 ENCOUNTER — Telehealth: Payer: Self-pay | Admitting: Cardiology

## 2022-07-17 ENCOUNTER — Other Ambulatory Visit: Payer: Self-pay | Admitting: Cardiology

## 2022-07-17 ENCOUNTER — Other Ambulatory Visit: Payer: Self-pay

## 2022-07-17 ENCOUNTER — Other Ambulatory Visit: Payer: Self-pay | Admitting: Legal Medicine

## 2022-07-17 DIAGNOSIS — J9611 Chronic respiratory failure with hypoxia: Secondary | ICD-10-CM

## 2022-07-17 DIAGNOSIS — F5101 Primary insomnia: Secondary | ICD-10-CM

## 2022-07-17 DIAGNOSIS — F321 Major depressive disorder, single episode, moderate: Secondary | ICD-10-CM

## 2022-07-17 DIAGNOSIS — E440 Moderate protein-calorie malnutrition: Secondary | ICD-10-CM

## 2022-07-17 NOTE — Telephone Encounter (Signed)
 *  STAT* If patient is at the pharmacy, call can be transferred to refill team.   1. Which medications need to be refilled? (please list name of each medication and dose if known) ranolazine (RANEXA) 500 MG 12 hr tablet   2. Which pharmacy/location (including street and city if local pharmacy) is medication to be sent to? [facility] - [place], [place] - [place] [address]   3. Do they need a 30 day or 90 day supply? 90 days  [facility] calling they need to send the medication today and request to get refill sent today

## 2022-07-17 NOTE — Telephone Encounter (Signed)
Pt's medication was sent to pt's pharmacy as requested. Confirmation received.  °

## 2022-07-19 ENCOUNTER — Other Ambulatory Visit: Payer: Self-pay | Admitting: Neurology

## 2022-07-22 ENCOUNTER — Telehealth: Payer: Self-pay

## 2022-07-22 MED ORDER — AIMOVIG 140 MG/ML ~~LOC~~ SOAJ: SUBCUTANEOUS | 3 refills | Status: DC

## 2022-07-22 NOTE — Telephone Encounter (Signed)
Telephone call from [facility], Patient needs a refill for Aimovig 140 mg.  Patient follow up is schedule for 10/2022, will refill until 09/2022 which is a year from her last visit with Dr.Jaffe.

## 2022-07-22 NOTE — Telephone Encounter (Signed)
Tyelisha from [employer] called in and left a message with the Access Nurse. She is requesting a refill of the pt's Aimovig '140mg'$  subq once monthly. States prescription has been filled in the last 60 days.

## 2022-07-30 ENCOUNTER — Other Ambulatory Visit: Payer: Self-pay | Admitting: Family Medicine

## 2022-07-30 ENCOUNTER — Ambulatory Visit: Payer: 59

## 2022-07-30 DIAGNOSIS — N184 Chronic kidney disease, stage 4 (severe): Secondary | ICD-10-CM

## 2022-07-31 ENCOUNTER — Telehealth: Payer: Self-pay | Admitting: Legal Medicine

## 2022-07-31 LAB — CBC
Hematocrit: 30.7 % — ABNORMAL LOW (ref 34.0–46.6)
Hemoglobin: 9.8 g/dL — ABNORMAL LOW (ref 11.1–15.9)
MCH: 30.1 pg (ref 26.6–33.0)
MCHC: 31.9 g/dL (ref 31.5–35.7)
MCV: 94 fL (ref 79–97)
Platelets: 355 10*3/uL (ref 150–450)
RBC: 3.26 x10E6/uL — ABNORMAL LOW (ref 3.77–5.28)
RDW: 11.9 % (ref 11.7–15.4)
WBC: 12.4 10*3/uL — ABNORMAL HIGH (ref 3.4–10.8)

## 2022-07-31 LAB — COMPREHENSIVE METABOLIC PANEL
ALT: 6 IU/L (ref 0–32)
AST: 12 IU/L (ref 0–40)
Albumin/Globulin Ratio: 1.7 (ref 1.2–2.2)
Albumin: 4 g/dL (ref 3.9–4.9)
Alkaline Phosphatase: 47 IU/L (ref 44–121)
BUN/Creatinine Ratio: 21 (ref 12–28)
BUN: 44 mg/dL — ABNORMAL HIGH (ref 8–27)
Bilirubin Total: 0.3 mg/dL (ref 0.0–1.2)
CO2: 17 mmol/L — ABNORMAL LOW (ref 20–29)
Calcium: 9.4 mg/dL (ref 8.7–10.3)
Chloride: 109 mmol/L — ABNORMAL HIGH (ref 96–106)
Creatinine, Ser: 2.07 mg/dL — ABNORMAL HIGH (ref 0.57–1.00)
Globulin, Total: 2.4 g/dL (ref 1.5–4.5)
Glucose: 137 mg/dL — ABNORMAL HIGH (ref 70–99)
Potassium: 3.8 mmol/L (ref 3.5–5.2)
Sodium: 144 mmol/L (ref 134–144)
Total Protein: 6.4 g/dL (ref 6.0–8.5)
eGFR: 26 mL/min/{1.73_m2} — ABNORMAL LOW (ref >59)

## 2022-07-31 NOTE — Progress Notes (Signed)
Wbc 12, 400 ? Infection, kidney tests remain stage 4 eGFR 26, unknown if she had seen nephrology  I called and left message lp

## 2022-08-05 NOTE — Telephone Encounter (Signed)
I asked to inquire about medicines

## 2022-08-12 ENCOUNTER — Telehealth: Payer: Self-pay

## 2022-08-12 NOTE — Chronic Care Management (AMB) (Signed)
Chronic Care Management Pharmacy Assistant   Name: [name]  MRN: [id number] DOB: 07-22-1956  Reason for Encounter: Medication Review/ Medication coordination  Recent office visits:  07-30-2022 Effie Shy, RN. Lab visit. WBC= 12.4, RBC= 3.26, Hemo= 9.8, Hema= 30.7. Glucose= 137, BUN= 44, Creatinine= 2.07, eGFR= 26, Chloride= 109, CO2= 17.  06-25-2022 Lillard Anes, MD. Negative covid test. DG chest view completed. START Augmentin 875-125 mg twice daily and guaifenesin 5 mLs 3 times daily PRN. Rocephin injection given.   Recent consult visits:  06-05-2022 Simone Curia, RN (Cardiology). Pacemaker check.  05-07-2022 Park Liter, MD (Cardiology). Orders placed for echocardiogram  Hospital visits:  Medication Reconciliation was completed by comparing discharge summary, patient's EMR and Pharmacy list, and upon discussion with patient.   Admitted to the hospital on 07/03/22 due to Acute Cough. Discharge date was 07/04/22. Discharged from [facility]?Medications Started at [facility] Discharge:?? -started Prednisone 58m and Benzonatate 2087m   -All other medications will remain the same.     Medications: Outpatient Encounter Medications as of 08/12/2022  Medication Sig   amoxicillin-clavulanate (AUGMENTIN) 875-125 MG tablet Take 1 tablet by mouth 2 (two) times daily.   ARIPiprazole (ABILIFY) 2 MG tablet TAKE ONE TABLET BY MOUTH ONCE DAILY   aspirin 81 MG tablet Take 81 mg by mouth daily.   Buprenorphine HCl (BELBUCA) 900 MCG FILM Place 900 mcg inside cheek every 12 (twelve) hours.   carvedilol (COREG) 3.125 MG tablet Take 1 tablet (3.125 mg total) by mouth 2 (two) times daily with a meal.   celecoxib (CELEBREX) 200 MG capsule Take 1 capsule (200 mg total) by mouth every morning.   denosumab (PROLIA) 60 MG/ML SOSY injection Inject 60 mg into the skin every 6 (six) months.   diazepam (VALIUM) 5 MG tablet TAKE ONE  TABLET BY MOUTH THREE TIMES DAILY AS NEEDED FOR ANXIETY   diclofenac Sodium (VOLTAREN) 1 % GEL Apply 2 g topically 4 (four) times daily. (Patient taking differently: Apply 2 g topically daily as needed (Pain).)   EPINEPHRINE 0.3 mg/0.3 mL IJ SOAJ injection Inject 0.3 mLs (0.3 mg total) into the muscle as needed for anaphylaxis.   Erenumab-aooe (AIMOVIG) 140 MG/ML SOAJ Inject 14072mnto THE SKIN every 28 DAYS   esomeprazole (NEXIUM) 20 MG capsule Take 1 capsule (20 mg total) by mouth daily.   fluticasone (FLONASE) 50 MCG/ACT nasal spray Instill TWO SPRAYS into BOTH nostrils daily AS NEEDED FOR congestion   fluticasone furoate-vilanterol (BREO ELLIPTA) 100-25 MCG/ACT AEPB INHALE 1 PUFF BY MOUTH INTO LUNGS ONCE DAILY in THE afternoon   fluticasone furoate-vilanterol (BREO ELLIPTA) 200-25 MCG/ACT AEPB Inhale 1 puff into the lungs daily. INHALE 1 PUFF BY MOUTH INTO LUNGS DAILY in THE afternoon Strength: 100-25 MCG/ACT   furosemide (LASIX) 20 MG tablet Take 1 tablet (20 mg total) by mouth 2 (two) times daily.   gabapentin (NEURONTIN) 800 MG tablet Take 1 tablet (800 mg total) by mouth 3 (three) times daily. Patient usually takes twice a day (Patient taking differently: Take 800 mg by mouth 3 (three) times daily.)   guaiFENesin-codeine (CHERATUSSIN AC) 100-10 MG/5ML syrup Take 5 mLs by mouth 3 (three) times daily as needed.   ipratropium-albuterol (DUONEB) 0.5-2.5 (3) MG/3ML SOLN Use 1 vial in nebulizer every 6 (six) hours as needed for shortness of breath   levocetirizine (XYZAL) 5 MG tablet Take 5 mg by mouth daily as needed for allergies.  megestrol (MEGACE) 20 MG tablet TAKE ONE TABLET BY MOUTH ONCE DAILY   Multiple Vitamin (MULTIVITAMIN WITH MINERALS) TABS tablet Take 1 tablet by mouth daily.   naloxegol oxalate (MOVANTIK) 12.5 MG TABS tablet Take 1 tablet (12.5 mg total) by mouth daily.   naloxone (NARCAN) nasal spray 4 mg/0.1 mL Place 1 spray into the nose once.   nitroGLYCERIN (NITROSTAT) 0.4 MG  SL tablet Place 1 tablet (0.4 mg total) under the tongue every 5 (five) minutes as needed for chest pain.   ondansetron (ZOFRAN) 4 MG tablet Take 1 tablet (4 mg total) by mouth every 8 (eight) hours as needed for nausea or vomiting.   oxyCODONE (ROXICODONE) 15 MG immediate release tablet Take 15 mg by mouth 3 (three) times daily as needed for pain.   ranolazine (RANEXA) 500 MG 12 hr tablet TAKE ONE TABLET BY MOUTH TWICE DAILY   rosuvastatin (CRESTOR) 20 MG tablet Take 1 tablet (20 mg total) by mouth daily.   sacubitril-valsartan (ENTRESTO) 24-26 MG Take 1 tablet by mouth 2 (two) times daily.   sertraline (ZOLOFT) 50 MG tablet TAKE 1 TABLET BY MOUTH DAILY (Patient taking differently: Take 50 mg by mouth daily.)   tiZANidine (ZANAFLEX) 4 MG tablet Take 4 mg by mouth at bedtime.   topiramate (TOPAMAX) 50 MG tablet TAKE 2 TABLETS(100 MG) BY MOUTH AT BEDTIME (Patient taking differently: Take 100 mg by mouth at bedtime. TAKE 2 TABLETS(100 MG) BY MOUTH AT BEDTIME)   Ubrogepant (UBRELVY) 100 MG TABS Take 100 mg by mouth as needed (May repeat after 2 hours.  Maximum 2 tablets in 24 hours). TAKE 1 TABLET BY MOUTH BY MOUTH AS NEEDED( MAY REPEAT 1 TABLET AFTER 2 HOURS IF NEEDED, MAXIMUM 2 TABLETS IN 24 HOURS) Strength: 100 mg   No facility-administered encounter medications on file as of 08/12/2022.  Reviewed chart for medication changes ahead of medication coordination call.  No medication changes indicated   BP Readings from Last 3 Encounters:  07/04/22 119/74  07/03/22 93/76  06/25/22 90/66    No results found for: "HGBA1C"   Patient obtains medications through Vials  90 Days   Last adherence delivery included:  Ranolazine ER 561m- 1 tab twice daily        Aripiprazole 279m 1 tab once daily   Aimovig Injection 14016mnject 140 mg once a month           Breo ellipta 100-25 mg 1 puff daily Megestrol 20 mg daily          Esomeprazole 65m71m capsule once daily    Entresto 24-26 mg twice  daily Celebrex 200 mg daily Rosuvastatin 20 mg daily Furosemide 65mg61mab twice daily           Carvedilol 3.125mg 68mb twice daily  Diazepam 5 mg 3 times daily as needed Aspirin 81mg- 25my  Flonase Nasal Spray-Place 2 sprays into both nostrils daily as needed  Patient declined (meds) last month: Unable to reach patient  Patient is due for next adherence delivery on: 08-23-2022  This delivery to include: Megestrol 20 mg daily        Aripiprazole 2mg- 1 38m once daily   Celebrex 200 mg daily  Breo ellipta 100-25 mg 1 puff daily  Furosemide 65mg-1 t34mwice daily     Topiramate 50mg-2 ta25mveryday at bedtime          Diazepam 5 mg 3 times daily as needed  Flonase Nasal Spray-Place 2 sprays into both nostrils daily  as needed    The following medications aren't needed: Ranolazine- Filled 07-17-2022 90 DS Carvedilol- Filled 07-17-2022 90 DS Aimovig Injection- Filled 07-17-2022 90 DS Entresto- Filled 07-17-2022 90 DS Esomeprazole- Filled 07-17-2022 90 DS Rosuvastatin- Filled 07-17-2022 90 DS Topiramate- Filled 07-17-2022 90 DS Aspirin- Filled 07-17-2022 90 DS  Patient needs refills for: None  Unable to confirm delivery date of 08-23-2022, pharmacy will contact them the morning of delivery.  08-12-2022: 1st attempt left VM 08-13-2022: 2nd attempt left VM 08-14-2022: 3rd attempt left VM  Care Gaps: Hep C screening overdue Tdap overdue Shingrix overdue PNA Vac overdue Flu vaccine overdue AWV overdue  Star Rating Drugs: Rosuvastatin 20 mg- Last filled 07-17-2022 90 DS. Previous 04-23-2022 90 DS Entresto 24-26 mg- Last filled 07-17-2022 90 DS. Previous 04-23-2022 [place] Clinical Pharmacist Assistant [phone]

## 2022-08-22 ENCOUNTER — Other Ambulatory Visit (HOSPITAL_COMMUNITY): Payer: Self-pay | Admitting: Nephrology

## 2022-08-22 DIAGNOSIS — N184 Chronic kidney disease, stage 4 (severe): Secondary | ICD-10-CM

## 2022-08-23 ENCOUNTER — Other Ambulatory Visit (HOSPITAL_COMMUNITY): Payer: Self-pay

## 2022-08-23 ENCOUNTER — Telehealth: Payer: Self-pay

## 2022-08-23 NOTE — Telephone Encounter (Signed)
Pharmacy Patient Advocate Encounter   Received notification from CoverMyMeds that prior authorization for Aimovig '140MG'$ /ML auto-injectors is required/requested.    PA submitted on 08/23/2022 to (ins) [employer] via CoverMyMeds Key [id number] Status is pending

## 2022-08-26 HISTORY — PX: TYMPANOSTOMY TUBE PLACEMENT: SHX32

## 2022-08-28 ENCOUNTER — Other Ambulatory Visit (HOSPITAL_COMMUNITY): Payer: Self-pay

## 2022-08-28 NOTE — Telephone Encounter (Signed)
Pharmacy Patient Advocate Encounter  Prior Authorization for Aimovig 140 MG/ML Darden Palmer has been approved.    PA# [id number] Effective dates: 08/23/2022 through 08/26/2023... Can be filled at [facility].

## 2022-09-04 ENCOUNTER — Ambulatory Visit (INDEPENDENT_AMBULATORY_CARE_PROVIDER_SITE_OTHER): Payer: 59

## 2022-09-04 DIAGNOSIS — I420 Dilated cardiomyopathy: Secondary | ICD-10-CM

## 2022-09-04 LAB — CUP PACEART REMOTE DEVICE CHECK
Battery Remaining Longevity: 95 mo
Battery Voltage: 2.93 V
Brady Statistic AP VP Percent: 0.01 %
Brady Statistic AP VS Percent: 2.41 %
Brady Statistic AS VP Percent: 0.03 %
Brady Statistic AS VS Percent: 97.55 %
Brady Statistic RA Percent Paced: 2.41 %
Brady Statistic RV Percent Paced: 0.04 %
Date Time Interrogation Session: 20240110012404
HighPow Impedance: 87 Ohm
Implantable Lead Connection Status: 753985
Implantable Lead Connection Status: 753985
Implantable Lead Implant Date: 20120502
Implantable Lead Implant Date: 20120502
Implantable Lead Location: 753859
Implantable Lead Location: 753860
Implantable Lead Model: 4076
Implantable Lead Model: 7122
Implantable Pulse Generator Implant Date: 20200925
Lead Channel Impedance Value: 399 Ohm
Lead Channel Impedance Value: 646 Ohm
Lead Channel Impedance Value: 760 Ohm
Lead Channel Pacing Threshold Amplitude: 0.5 V
Lead Channel Pacing Threshold Amplitude: 1.125 V
Lead Channel Pacing Threshold Pulse Width: 0.4 ms
Lead Channel Pacing Threshold Pulse Width: 0.4 ms
Lead Channel Sensing Intrinsic Amplitude: 11.875 mV
Lead Channel Sensing Intrinsic Amplitude: 11.875 mV
Lead Channel Sensing Intrinsic Amplitude: 2.375 mV
Lead Channel Sensing Intrinsic Amplitude: 2.375 mV
Lead Channel Setting Pacing Amplitude: 1.5 V
Lead Channel Setting Pacing Amplitude: 2.25 V
Lead Channel Setting Pacing Pulse Width: 0.4 ms
Lead Channel Setting Sensing Sensitivity: 0.3 mV
Zone Setting Status: 755011
Zone Setting Status: 755011

## 2022-09-10 ENCOUNTER — Encounter (HOSPITAL_COMMUNITY): Payer: 59

## 2022-09-16 ENCOUNTER — Ambulatory Visit: Payer: 59 | Attending: Cardiology

## 2022-09-16 DIAGNOSIS — N184 Chronic kidney disease, stage 4 (severe): Secondary | ICD-10-CM | POA: Diagnosis not present

## 2022-09-16 NOTE — Telephone Encounter (Signed)
ERROR

## 2022-09-19 ENCOUNTER — Encounter: Payer: 59 | Admitting: Family Medicine

## 2022-09-19 NOTE — Progress Notes (Signed)
Pt had dates wrong and asked to reschedule.

## 2022-09-25 ENCOUNTER — Other Ambulatory Visit: Payer: Self-pay | Admitting: Family Medicine

## 2022-09-25 NOTE — Progress Notes (Signed)
Remote ICD transmission.   

## 2022-10-08 ENCOUNTER — Telehealth: Payer: 59 | Admitting: Family Medicine

## 2022-10-08 NOTE — Progress Notes (Signed)
Nurse tried to contact pt and could not reach her. I was able to reach her, but she said she was currently at another doctor visit and requested to reschedule. Let my staff know she wished to reschedule.

## 2022-10-10 ENCOUNTER — Telehealth (INDEPENDENT_AMBULATORY_CARE_PROVIDER_SITE_OTHER): Payer: 59 | Admitting: Family Medicine

## 2022-10-10 DIAGNOSIS — Z0000 Encounter for general adult medical examination without abnormal findings: Secondary | ICD-10-CM | POA: Diagnosis not present

## 2022-10-10 NOTE — Progress Notes (Signed)
PATIENT CHECK-IN and HEALTH RISK ASSESSMENT QUESTIONNAIRE:  -completed by phone/video for upcoming Medicare Preventive Visit  Pre-Visit Check-in: 1)Vitals (height, wt, BP, etc) - record in vitals section for visit on day of visit 2)Review and Update Medications, Allergies PMH, Surgeries, Social history in Epic 3)Hospitalizations in the last year with date/reason?  No 4)Review and Update Care Team (patient's specialists) in Epic 5) Complete PHQ9 in Epic  6) Complete Fall Screening in Epic 7)Review all Health Maintenance Due and order under PCP if not done.  8)Medicare Wellness Questionnaire: Answer theses question about your habits: Do you drink alcohol? No If yes, how many drinks do you have a day?None Have you ever smoked?Yes  Quit date if applicable?  3 years ago How many packs a day do/did you smoke? 1/2 pack per day Do you use smokeless tobacco?No Do you use an illicit drugs?No Do you exercises? Walks some - once a week.  Are you sexually active? No  Number of partners? 0 Typical breakfast: coffee Typical lunch: don't lunch Typical dinner: hamburger, hot dog Typical snacks:cookies, triscuits  Beverages:  coffee, water   Answer theses question about you: Can you perform most household chores?can perform some Do you find it hard to follow a conversation in a noisy room?Yes Do you often ask people to speak up or repeat themselves?Yes, sometimes - seeing ENT - has issues with fluid behind the ears.  Do you feel that you have a problem with memory?Yes Do you balance your checkbook and or bank accounts?Yes Do you feel safe at home?Yes Last dentist visit?1 year ago Do you need assistance with any of the following: Please note if so   Driving?-Yes  Feeding yourself?-No  Getting from bed to chair?-No  Getting to the toilet?-No  Bathing or showering?No  Dressing yourself?-No  Managing money?-No  Climbing a flight of stairs-Yes  Preparing meals?-No  Do you have Advanced  Directives in place (Living Will, Healthcare Power or Attorney)? -No   Last eye Exam and location?2 year ago, [place] in [place] you currently use prescribed or non-prescribed narcotic or opioid pain medications?Yes, Oxycodone, [place]  Do you have a history or close family history of breast, ovarian, tubal or peritoneal cancer or a family member with BRCA (breast cancer susceptibility 1 and 2) gene mutations?  Nurse/Assistant Credentials/time stamp:  St   ----------------------------------------------------------------------------------------------------------------------------------------------------------------------------------------------------------------------   MEDICARE ANNUAL PREVENTIVE VISIT WITH PROVIDER: (Welcome to [employer], initial annual wellness or annual wellness exam)  Virtual Visit via Video Note  I connected with [name] on 10/10/22 by a video enabled telemedicine application and verified that I am speaking with the correct person using two identifiers.  Location patient: home Location provider:work or home office Persons participating in the virtual visit: patient, provider, daughter  Concerns and/or follow up today: none, reports stable   See HM section in Epic for other details of completed HM.    ROS: negative for report of fevers, unintentional weight loss, vision changes, vision loss, hearing loss or change, chest pain, sob, hemoptysis, melena, hematochezia, hematuria, genital discharge or lesions, falls, bleeding or bruising, loc, thoughts of suicide or self harm, memory loss  Patient-completed extensive health risk assessment - reviewed and discussed with the patient: See Health Risk Assessment completed with patient prior to the visit either above or in recent phone note. This was reviewed in detailed with the patient today and appropriate recommendations, orders and referrals were placed as needed per Summary below and patient  instructions.  Review of Medical History: -PMH, PSH, Family History and current specialty and care providers reviewed and updated and listed below   Patient Care Team: Lillard Anes, MD (Inactive) as PCP - General (Family Medicine) Constance Haw, MD as PCP - Electrophysiology (Cardiology) Park Liter, MD as PCP - Cardiology (Cardiology) Newman Pies, MD as Consulting Physician (Neurosurgery) Park Liter, MD as Consulting Physician (Cardiology) Michael Boston, MD as Consulting Physician (General Surgery) Pieter Partridge, DO as Consulting Physician (Neurology) Burnice Logan, [facility] (Inactive) as Pharmacist (Pharmacist) Ronnette Juniper, MD as Consulting Physician (Gastroenterology) Zehr, Laban Emperor, PA-C as Physician Assistant (Gastroenterology)   Past Medical History:  Diagnosis Date   Abdominal pain 10/17/2020   Abnormality of gait due to impairment of balance 11/22/2020   Acute diverticulitis 10/17/2020   Admission for long-term opiate analgesic use 10/24/2019   Arthritis of right hip 05/29/2016   Formatting of this note might be different from the original. Added automatically from request for surgery [id number]   BMI 26.0-26.9,adult 03/29/2020   Bronchial asthma    Cardiomyopathy ([place])    Overview:  Ejection fraction 45% in 2015 Ejection fraction 30 to 35% in November 2018   Chronic back pain    Chronic constipation 07/31/2021   Chronic hypoxemic respiratory failure ([place]) 10/24/2019   Chronic narcotic use 03/23/2015   Chronic pain of both knees 12/21/2018   Added automatically from request for surgery [id number]  Formatting of this note might be different from the original. 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[place]) 07/13/2020   Hypotension 05/17/2020   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   Ileus following gastrointestinal surgery ([place]) 03/26/2015   Major depressive disorder, single episode, moderate ([place]) 10/24/2019   Malnutrition of moderate degree ([place]) 10/20/2020   Mesenteric ischemia ([place]) 10/16/2020   Migraine without aura with status migrainosus 10/24/2019   Mixed incontinence 10/20/2020   Myocardial infarction (HCC)    Nausea & vomiting    Obstructive chronic bronchitis with exacerbation ([place]) 10/25/2019   Other spondylosis with radiculopathy, lumbar region 10/24/2019   Persistent vomiting 05/17/2020   Presence of left artificial hip joint 10/24/2019   PTSD (post-traumatic stress disorder)    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 03/23/2015 04/19/2014   Senile osteoporosis 10/24/2019   Sepsis ([place]) 10/17/2020   Serosanguineous chronic otitis media of right ear 08/02/2021   Small bowel obstruction ([place])    Thyroid disease    Upper respiratory tract infection due to COVID-19 virus 07/06/2021   Wellness examination 03/27/2021    Past Surgical History:  Procedure Laterality Date   APPENDECTOMY     CARDIAC CATHETERIZATION     CARPAL TUNNEL RELEASE     CESAREAN SECTION     CHF s/p AICD   12/2010   CHOLECYSTECTOMY  COLONOSCOPY  10/16/2015   Mild colonic diverticulosis, predominantly in the left colon. Small internal hemrrhoids. Otherwise normal  colonoscopy   COLONOSCOPY WITH PROPOFOL N/A 09/27/2021   Procedure: COLONOSCOPY WITH PROPOFOL;  Surgeon: Mauri Pole, MD;  Location: [facility];  Service: Endoscopy;  Laterality: N/A;   CORONARY ANGIOPLASTY     ESOPHAGOGASTRODUODENOSCOPY  02/04/2014   Mild gastritis. Otherwise normal EGD   HAND SURGERY     ICD GENERATOR CHANGEOUT N/A 05/21/2019   Procedure: ICD GENERATOR CHANGEOUT;  Surgeon: Constance Haw, MD;  Location: [facility];  Service: Cardiovascular;  Laterality: N/A;   ICD IMPLANT     Medtronic   intestinal blockage 2011     LAPAROSCOPIC ASSISTED VENTRAL HERNIA REPAIR N/A 03/23/2015   Procedure: LAPAROSCOPIC VENTRAL WALL HERNIA REPAIR;  Surgeon: Michael Boston, MD;  Location: [facility];  Service: General;  Laterality: N/A;  With MESH   LAPAROSCOPIC LYSIS OF ADHESIONS N/A 03/23/2015   Procedure: LAPAROSCOPIC LYSIS OF ADHESIONS;  Surgeon: Michael Boston, MD;  Location: [facility];  Service: General;  Laterality: N/A;   LSCS      x2   NASAL SEPTUM SURGERY     NECK SURGERY     fused   TONSILLECTOMY     ULNAR NERVE TRANSPOSITION  01/23/2012   Procedure: ULNAR NERVE DECOMPRESSION/TRANSPOSITION;  Surgeon: Ophelia Charter, MD;  Location: [facility];  Service: Neurosurgery;  Laterality: Left;  LEFT ulnar nerve decompression    Social History   Socioeconomic History   Marital status: Single    Spouse name: Not on file   Number of children: 3   Years of education: Not on file   Highest education level: Not on file  Occupational History   Occupation: disabled  Tobacco Use   Smoking status: Former    Packs/day: 0.50    Years: 30.00    Total pack years: 15.00    Types: Cigarettes    Quit date: 03/2020    Years since quitting: 2.5   Smokeless tobacco: Never  Vaping Use   Vaping Use: Never used  Substance and Sexual Activity   Alcohol use: Yes    Alcohol/week: 2.0 standard drinks of alcohol    Types: 1 Cans of beer, 1 Standard drinks or equivalent per week     Comment: seldom   Drug use: No   Sexual activity: Not Currently  Other Topics Concern   Not on file  Social History Narrative   One level home with boyfriend   Caffeine - coffee 1-2 cups/day; Green tea 4-5 bottles a day   Exercise - some    Right handed      Social Determinants of Health   Financial Resource Strain: Low Risk  (03/27/2021)   Overall Financial Resource Strain (CARDIA)    Difficulty of Paying Living Expenses: Not hard at all  Food Insecurity: No Food Insecurity (10/31/2020)   Hunger Vital Sign    Worried About Running Out of Food in the Last Year: Never true    Ran Out of Food in the Last Year: Never true  Transportation Needs: Unmet Transportation Needs (03/27/2021)   PRAPARE - Hydrologist (Medical): Not on file    Lack of Transportation (Non-Medical): Yes  Physical Activity: Inactive (03/27/2021)   Exercise Vital Sign    Days of Exercise per Week: 0 days    Minutes of Exercise per Session: 0 min  Stress: Stress Concern Present (03/27/2021)   [employer] of  Occupational Health - Occupational Stress Questionnaire    Feeling of Stress : Rather much  Social Connections: Moderately Integrated (03/27/2021)   Social Connection and Isolation Panel [NHANES]    Frequency of Communication with Friends and Family: More than three times a week    Frequency of Social Gatherings with Friends and Family: Never    Attends Religious Services: 1 to 4 times per year    Active Member of [employer] or Organizations: No    Attends Archivist Meetings: Never    Marital Status: Married  Human resources officer Violence: Unknown (03/27/2021)   Humiliation, Afraid, Rape, and Kick questionnaire    Fear of Current or Ex-Partner: No    Emotionally Abused: No    Physically Abused: No    Sexually Abused: Not on file    Family History  Problem Relation Age of Onset   Hypertension Mother    Heart attack Mother    Alcohol abuse Father    Hypertension Father     Heart attack Father    Heart attack Other    Cancer Other    Heart failure Other    Anesthesia problems Neg Hx    Hypotension Neg Hx    Malignant hyperthermia Neg Hx    Pseudochol deficiency Neg Hx    Breast cancer Neg Hx     Current Outpatient Medications on File Prior to Visit  Medication Sig Dispense Refill   amoxicillin-clavulanate (AUGMENTIN) 875-125 MG tablet Take 1 tablet by mouth 2 (two) times daily. 20 tablet 0   ARIPiprazole (ABILIFY) 2 MG tablet TAKE ONE TABLET BY MOUTH ONCE DAILY 90 tablet 2   aspirin 81 MG tablet Take 81 mg by mouth daily.     Buprenorphine HCl (BELBUCA) 900 MCG FILM Place 900 mcg inside cheek every 12 (twelve) hours.     carvedilol (COREG) 3.125 MG tablet Take 1 tablet (3.125 mg total) by mouth 2 (two) times daily with a meal. 180 tablet 2   celecoxib (CELEBREX) 200 MG capsule Take 1 capsule (200 mg total) by mouth every morning. 90 capsule 2   denosumab (PROLIA) 60 MG/ML SOSY injection Inject 60 mg into the skin every 6 (six) months. 180 mL 2   diazepam (VALIUM) 5 MG tablet TAKE ONE TABLET BY MOUTH THREE TIMES DAILY AS NEEDED FOR ANXIETY 90 tablet 2   diclofenac Sodium (VOLTAREN) 1 % GEL Apply 2 g topically 4 (four) times daily. (Patient taking differently: Apply 2 g topically daily as needed (Pain).) 50 g 4   EPINEPHRINE 0.3 mg/0.3 mL IJ SOAJ injection Inject 0.3 mLs (0.3 mg total) into the muscle as needed for anaphylaxis. 1 each 2   Erenumab-aooe (AIMOVIG) 140 MG/ML SOAJ Inject 152m into THE SKIN every 28 DAYS 1 mL 3   esomeprazole (NEXIUM) 20 MG capsule Take 1 capsule (20 mg total) by mouth daily. 90 capsule 1   fluticasone (FLONASE) 50 MCG/ACT nasal spray Instill TWO SPRAYS into BOTH nostrils daily AS NEEDED FOR congestion 18 g 3   fluticasone furoate-vilanterol (BREO ELLIPTA) 100-25 MCG/ACT AEPB INHALE 1 PUFF BY MOUTH INTO LUNGS ONCE DAILY in THE afternoon 60 each 2   fluticasone furoate-vilanterol (BREO ELLIPTA) 200-25 MCG/ACT AEPB Inhale 1 puff  into the lungs daily. INHALE 1 PUFF BY MOUTH INTO LUNGS DAILY in THE afternoon Strength: 100-25 MCG/ACT 60 each 5   furosemide (LASIX) 20 MG tablet Take 1 tablet (20 mg total) by mouth 2 (two) times daily. 180 tablet 1   gabapentin (NEURONTIN) 800  MG tablet Take 1 tablet (800 mg total) by mouth 3 (three) times daily. Patient usually takes twice a day (Patient taking differently: Take 800 mg by mouth 3 (three) times daily.) 270 tablet 2   guaiFENesin-codeine (CHERATUSSIN AC) 100-10 MG/5ML syrup Take 5 mLs by mouth 3 (three) times daily as needed. 120 mL 0   ipratropium-albuterol (DUONEB) 0.5-2.5 (3) MG/3ML SOLN Use 1 vial in nebulizer every 6 (six) hours as needed for shortness of breath 3 mL 11   levocetirizine (XYZAL) 5 MG tablet Take 5 mg by mouth daily as needed for allergies.     megestrol (MEGACE) 20 MG tablet TAKE ONE TABLET BY MOUTH ONCE DAILY 90 tablet 2   Multiple Vitamin (MULTIVITAMIN WITH MINERALS) TABS tablet Take 1 tablet by mouth daily.     naloxegol oxalate (MOVANTIK) 12.5 MG TABS tablet Take 1 tablet (12.5 mg total) by mouth daily. 30 tablet 5   naloxone (NARCAN) nasal spray 4 mg/0.1 mL Place 1 spray into the nose once.     nitrofurantoin, macrocrystal-monohydrate, (MACROBID) 100 MG capsule Take 100 mg by mouth 2 (two) times daily.     nitroGLYCERIN (NITROSTAT) 0.4 MG SL tablet Place 1 tablet (0.4 mg total) under the tongue every 5 (five) minutes as needed for chest pain. 25 tablet 6   ondansetron (ZOFRAN) 4 MG tablet Take 1 tablet (4 mg total) by mouth every 8 (eight) hours as needed for nausea or vomiting. 20 tablet 0   oxyCODONE (ROXICODONE) 15 MG immediate release tablet Take 15 mg by mouth 3 (three) times daily as needed for pain.     predniSONE (DELTASONE) 10 MG tablet 10 mg daily with breakfast.     ranolazine (RANEXA) 500 MG 12 hr tablet TAKE ONE TABLET BY MOUTH TWICE DAILY 180 tablet 3   rosuvastatin (CRESTOR) 20 MG tablet Take 1 tablet (20 mg total) by mouth daily. 90  tablet 1   sacubitril-valsartan (ENTRESTO) 24-26 MG Take 1 tablet by mouth 2 (two) times daily. 180 tablet 2   sertraline (ZOLOFT) 50 MG tablet TAKE 1 TABLET BY MOUTH DAILY (Patient taking differently: Take 50 mg by mouth daily.) 90 tablet 2   tiZANidine (ZANAFLEX) 4 MG tablet Take 4 mg by mouth at bedtime.     topiramate (TOPAMAX) 50 MG tablet TAKE 2 TABLETS(100 MG) BY MOUTH AT BEDTIME (Patient taking differently: Take 100 mg by mouth at bedtime. TAKE 2 TABLETS(100 MG) BY MOUTH AT BEDTIME) 180 tablet 0   Ubrogepant (UBRELVY) 100 MG TABS Take 100 mg by mouth as needed (May repeat after 2 hours.  Maximum 2 tablets in 24 hours). TAKE 1 TABLET BY MOUTH BY MOUTH AS NEEDED( MAY REPEAT 1 TABLET AFTER 2 HOURS IF NEEDED, MAXIMUM 2 TABLETS IN 24 HOURS) Strength: 100 mg 16 tablet 5   No current facility-administered medications on file prior to visit.    No Known Allergies     Physical Exam There were no vitals filed for this visit. Estimated body mass index is 29.29 kg/m as calculated from the following:   Height as of 09/19/22: 5' (1.524 m).   Weight as of 09/19/22: 150 lb (68 kg).  EKG (optional): deferred due to virtual visit  GENERAL: alert, oriented, no acute distress detected, full vision exam deferred due to pandemic and/or virtual encounter  HEENT: atraumatic, conjunttiva clear, no obvious abnormalities on inspection of external nose and ears  NECK: normal movements of the head and neck  LUNGS: on inspection no signs of respiratory distress,  breathing rate appears normal, no obvious gross SOB, gasping or wheezing  CV: no obvious cyanosis  MS: moves all visible extremities without noticeable abnormality  PSYCH/NEURO: pleasant and cooperative, no obvious depression or anxiety, speech and thought processing grossly intact, Cognitive function grossly intact  Flowsheet Row Video Visit from 10/10/2022 in [place] [facility]  PHQ-9 Total Score 12            10/10/2022    4:32 PM 03/20/2022   10:47 AM 06/26/2021    3:50 PM 05/22/2021   10:18 AM 03/27/2021    1:45 PM  Depression screen PHQ 2/9  Decreased Interest 3 0 2 1 2  $ Down, Depressed, Hopeless 3 1 1 $ 0 2  PHQ - 2 Score 6 1 3 1 4  $ Altered sleeping 0 1 3 3 3  $ Tired, decreased energy 3 2 0 3 3  Change in appetite 3 2 3 3 3  $ Feeling bad or failure about yourself  0 2 1 0 0  Trouble concentrating 0 1 0 0 3  Moving slowly or fidgety/restless 0 0 0 0 2  Suicidal thoughts 0 0 1 0 0  PHQ-9 Score 12 9 11 10 18  $ Difficult doing work/chores Not difficult at all Not difficult at all Somewhat difficult Not difficult at all Very difficult       05/22/2021   10:18 AM 10/15/2021   10:12 AM 07/03/2022    5:53 PM 07/04/2022    2:36 PM 10/10/2022    4:32 PM  Fall Risk  Falls in the past year? 0 0   0  Was there an injury with Fall? 0 0   0  Fall Risk Category Calculator 0 0   0  Fall Risk Category (Retired) Low Low     (RETIRED) Patient Fall Risk Level Low fall risk Low fall risk Low fall risk Low fall risk      SUMMARY AND PLAN:  Medicare annual wellness visit, subsequent    Discussed applicable health maintenance/preventive health measures and advised and referred or ordered per patient preferences:  Health Maintenance  Topic Date Due   COVID-19 Vaccine (1) Never done   Hepatitis C Screening  Never done   DTaP/Tdap/Td (1 - Tdap) Never done   Zoster Vaccines- Shingrix (1 of 2) Never done   Pneumonia Vaccine 86+ Years old (3 of 3 - PCV) 06/02/2021   Medicare Annual Wellness (AWV)  10/11/2023   MAMMOGRAM  10/25/2023   COLONOSCOPY (Pts 45-74yr Insurance coverage will need to be confirmed)  09/28/2031   INFLUENZA VACCINE  Completed   DEXA SCAN  Completed   HPV VACCINES  Aged Out   Advised DEXA. She plans to schedule appt with Dr. CTobie Poetto discuss bone health and request dexa then. She reports was told has osteoporosis but was not offered tx. She does take some vit d. No regular exxercise  currently. Daughter agrees to schedule appt.     Education and counseling on the following was provided based on the above review of health and a plan/checklist for the patient, along with additional information discussed, was provided for the patient in the patient instructions :   -She is seeing ENT for hearing issues -Provided counseling and plan for increased risk of falling if applicable per above screening. Safe balance exercises provided and also encouraged to start walking in place while holding on for 5 minutes per day - then gradually increasing. -Provided number for counseling for mental health as she  seemed interested, daughter agrees to schedule. -Advised and counseled on maintaining healthy weight and healthy lifestyle - including the importance of a healthy diet, regular physical activity, social connections and stress management. -Advised and counseled on a whole foods based healthy diet and regular exercise: discussed a heart healthy whole foods based diet at length. A summary of a healthy diet was provided in the Patient Instructions. They plan to try to add some healthy snacks: broccoli, veggies with ranch or vinaigrette, fruits, etc.  -Advise yearly dental visits at minimum and regular eye exams Info on advanced directives provided - see patient instructions Follow up: see patient instructions     Patient Instructions  I really enjoyed getting to talk with you today! I am available on Tuesdays and Thursdays for virtual visits if you have any questions or concerns, or if I can be of any further assistance.   CHECKLIST FROM ANNUAL WELLNESS VISIT:  -Follow up (please call to schedule if not scheduled after visit):   -Inperson visit with your Primary Doctor office: in next 2-3 months, please discuss your bone health  -yearly for annual wellness visit with primary care office  Here is a list of your preventive care/health maintenance measures and the plan for each if any  are due: For the vaccines - you can get theses at the pharmacy, please bring a record to Dr. Tobie Poet for any you do at the pharmacy. Thank you! You are due for you bone density - please see Dr. Tobie Poet as planned Health Maintenance  Topic Date Due   COVID-19 Vaccine (1) Never done, can get at the pharmacy   Hepatitis C Screening  Never done, can request from you doctor when you get labs   DTaP/Tdap/Td (1 - Tdap) Never done, can get at the pharmacy or with your doctor   Zoster Vaccines- Shingrix (1 of 2) Never done,can get at the pharmacy or with your doctor   Pneumonia Vaccine 68+ Years old (3 of 3 - PCV) 06/02/2021   INFLUENZA VACCINE  03/26/2022   Medicare Annual Wellness (AWV)  10/11/2023   MAMMOGRAM  10/25/2023   COLONOSCOPY (Pts 45-34yr Insurance coverage will need to be confirmed)  09/28/2031   DEXA SCAN  Completed   HPV VACCINES  Aged Out    -See a dentist at least yearly  -Get your eyes checked and then per your eye specialist's recommendations  -Other issues addressed today:  1) Call to schedule Cognitive Behavioral Therapy to help with mood:  [place] [phone] 2) Start balance exercises once daily, holding on as we discussed - see details below  3) Start 5 minutes per day of walking in place while holding on to walker if you do not get out to walk- set a timer, slowly increase (goal 20 minutes per day  4) Add in some healthy snacks: fresh broccoli, fresh celery, carrots (an veggies) and fresh berries/fruits a few times per day   -I have included below further information regarding a healthy whole foods based diet, physical activity guidelines for adults, stress management and opportunities for social connections. I hope you find this information useful.    -----------------------------------------------------------------------------------------------------------------------------------------------------------------------------------------------------------------------------------------------------------  NUTRITION: -eat real food: lots of colorful vegetables (half the plate) and fruits -5-7 servings of vegetables and fruits per day (fresh or steamed is best), exp. 2 servings of vegetables with lunch and dinner and 2 servings of fruit per day. Berries and greens such as kale and collards are great choices.  -consume on a  regular basis: whole grains (make sure first ingredient on label contains the word "whole"), fresh fruits, fish, nuts, seeds, healthy oils (such as olive oil, avocado oil, grape seed oil) -may eat small amounts of dairy and lean meat on occasion, but avoid processed meats such as ham, bacon, lunch meat, etc. -drink water -try to avoid fast food and pre-packaged foods, processed meat -most experts advise limiting sodium to < 2331m per day, should limit further is any chronic conditions such as high blood pressure, heart disease, diabetes, etc. The American Heart Association advised that < 15070mis is ideal -try to avoid foods that contain any ingredients with names you do not recognize  -try to avoid sugar/sweets (except for the natural sugar that occurs in fresh fruit) -try to avoid sweet drinks -try to avoid white rice, white bread, pasta (unless whole grain), white or yellow potatoes  EXERCISE GUIDELINES FOR ADULTS: -if you wish to increase your physical activity, do so gradually and with the approval of your doctor -STOP and seek medical care immediately if you have any chest pain, chest discomfort or trouble breathing when starting or increasing exercise  -move and stretch your body, legs, feet and arms when sitting for long periods -Physical activity guidelines for optimal health in adults: -least 150 minutes per week of  aerobic exercise (can talk, but not sing) once approved by your doctor, 20-30 minutes of sustained activity or two 10 minute episodes of sustained activity every day.  -resistance training at least 2 days per week if approved by your doctor -balance exercises 3+ days per week:   Stand somewhere where you have something sturdy to hold onto if you lose balance.    1) lift up on toes, start with 5x per day and work up to 20x   2) stand and lift on leg straight out to the side so that foot is a few inches of the floor, start with 5x each side and work up to 20x each side   3) stand on one foot, start with 5 seconds each side and work up to 20 seconds on each side  If you need ideas or help with getting more active:  -Silver sneakers https://tools.silversneakers.com  -Walk with a Doc: Hthttp://stephens-thompson.biz/-try to include resistance (weight lifting/strength building) and balance exercises twice per week: or the following link for ideas: htChessContest.frhtUpdateClothing.com.cySTRESS MANAGEMENT: -can try meditating, or just sitting quietly with deep breathing while intentionally relaxing all parts of your body for 5 minutes daily -if you need further help with stress, anxiety or depression please follow up with your primary doctor or contact the wonderful folks at [place] in [place] you wish to engage in more social and exercise related activities:  -Silver sneakers https://tools.silversneakers.com  -Walk with a Doc: Hthttp://stephens-thompson.biz/-Check out the [place]0+ section on the [place] Gr[place]ibur[employer] Companyhiking clubs, book clubs, cards and games, chess, exercise classes, aquatic classes and much more) - see the website for  details: https://www.[place].gov/departments/parks-recreation/active-adults50  -YouTube has lots of exercise videos for different ages and abilities as well  -Sm[place]armingtona variety of indoor and outdoor inperson activities for adults). [phone] [address] -Virtual Online Classes (a variety of topics): see seniorplanet.org or call [phone]-consider volunteering at a school, hospice center, church, senior center or elsewhere           HaLucretia KernDO

## 2022-10-10 NOTE — Patient Instructions (Addendum)
I really enjoyed getting to talk with you today! I am available on Tuesdays and Thursdays for virtual visits if you have any questions or concerns, or if I can be of any further assistance.   CHECKLIST FROM ANNUAL WELLNESS VISIT:  -Follow up (please call to schedule if not scheduled after visit):   -Inperson visit with your Primary Doctor office: in next 2-3 months, please discuss your bone health  -yearly for annual wellness visit with primary care office  Here is a list of your preventive care/health maintenance measures and the plan for each if any are due: For the vaccines - you can get theses at the pharmacy, please bring a record to Dr. Tobie Poet for any you do at the pharmacy. Thank you! You are due for you bone density - please see Dr. Tobie Poet as planned Health Maintenance  Topic Date Due   COVID-19 Vaccine (1) Never done, can get at the pharmacy   Hepatitis C Screening  Never done, can request from you doctor when you get labs   DTaP/Tdap/Td (1 - Tdap) Never done, can get at the pharmacy or with your doctor   Zoster Vaccines- Shingrix (1 of 2) Never done,can get at the pharmacy or with your doctor   Pneumonia Vaccine 42+ Years old (3 of 3 - PCV) 06/02/2021   INFLUENZA VACCINE  03/26/2022   Medicare Annual Wellness (AWV)  10/11/2023   MAMMOGRAM  10/25/2023   COLONOSCOPY (Pts 45-69yr Insurance coverage will need to be confirmed)  09/28/2031   DEXA SCAN  Completed   HPV VACCINES  Aged Out    -See a dentist at least yearly  -Get your eyes checked and then per your eye specialist's recommendations  -Other issues addressed today:  1) Call to schedule Cognitive Behavioral Therapy to help with mood:  [place] [phone] 2) Start balance exercises once daily, holding on as we discussed - see details below  3) Start 5 minutes per day of walking in place while holding on to walker if you do not get out to walk- set a timer, slowly increase (goal 20 minutes per day  4)  Add in some healthy snacks: fresh broccoli, fresh celery, carrots (an veggies) and fresh berries/fruits a few times per day   -I have included below further information regarding a healthy whole foods based diet, physical activity guidelines for adults, stress management and opportunities for social connections. I hope you find this information useful.   -----------------------------------------------------------------------------------------------------------------------------------------------------------------------------------------------------------------------------------------------------------  NUTRITION: -eat real food: lots of colorful vegetables (half the plate) and fruits -5-7 servings of vegetables and fruits per day (fresh or steamed is best), exp. 2 servings of vegetables with lunch and dinner and 2 servings of fruit per day. Berries and greens such as kale and collards are great choices.  -consume on a regular basis: whole grains (make sure first ingredient on label contains the word "whole"), fresh fruits, fish, nuts, seeds, healthy oils (such as olive oil, avocado oil, grape seed oil) -may eat small amounts of dairy and lean meat on occasion, but avoid processed meats such as ham, bacon, lunch meat, etc. -drink water -try to avoid fast food and pre-packaged foods, processed meat -most experts advise limiting sodium to < 23022mper day, should limit further is any chronic conditions such as high blood pressure, heart disease, diabetes, etc. The American Heart Association advised that < 150059ms is ideal -try to avoid foods that contain any ingredients with names you do not recognize  -try  to avoid sugar/sweets (except for the natural sugar that occurs in fresh fruit) -try to avoid sweet drinks -try to avoid white rice, white bread, pasta (unless whole grain), white or yellow potatoes  EXERCISE GUIDELINES FOR ADULTS: -if you wish to increase your physical activity, do so  gradually and with the approval of your doctor -STOP and seek medical care immediately if you have any chest pain, chest discomfort or trouble breathing when starting or increasing exercise  -move and stretch your body, legs, feet and arms when sitting for long periods -Physical activity guidelines for optimal health in adults: -least 150 minutes per week of aerobic exercise (can talk, but not sing) once approved by your doctor, 20-30 minutes of sustained activity or two 10 minute episodes of sustained activity every day.  -resistance training at least 2 days per week if approved by your doctor -balance exercises 3+ days per week:   Stand somewhere where you have something sturdy to hold onto if you lose balance.    1) lift up on toes, start with 5x per day and work up to 20x   2) stand and lift on leg straight out to the side so that foot is a few inches of the floor, start with 5x each side and work up to 20x each side   3) stand on one foot, start with 5 seconds each side and work up to 20 seconds on each side  If you need ideas or help with getting more active:  -Silver sneakers https://tools.silversneakers.com  -Walk with a Doc: http://stephens-thompson.biz/  -try to include resistance (weight lifting/strength building) and balance exercises twice per week: or the following link for ideas: ChessContest.fr  UpdateClothing.com.cy  STRESS MANAGEMENT: -can try meditating, or just sitting quietly with deep breathing while intentionally relaxing all parts of your body for 5 minutes daily -if you need further help with stress, anxiety or depression please follow up with your primary doctor or contact the wonderful folks at [place]: [place]: -options in [place] if you wish to engage in more social and exercise related activities:  -Silver  sneakers https://tools.silversneakers.com  -Walk with a Doc: http://stephens-thompson.biz/  -Check out the [place] 50+ section on the [place] [employer] (hiking clubs, book clubs, cards and games, chess, exercise classes, aquatic classes and much more) - see the website for details: https://www.[place].gov/departments/parks-recreation/active-adults50  -YouTube has lots of exercise videos for different ages and abilities as well  -[place] (a variety of indoor and outdoor inperson activities for adults). [phone]. [address]  -Virtual Online Classes (a variety of topics): see seniorplanet.org or call [phone]  -consider volunteering at a school, hospice center, church, senior center or elsewhere

## 2022-10-14 ENCOUNTER — Other Ambulatory Visit: Payer: Self-pay

## 2022-10-14 ENCOUNTER — Ambulatory Visit: Payer: 59 | Attending: Cardiology | Admitting: Cardiology

## 2022-10-14 ENCOUNTER — Encounter: Payer: Self-pay | Admitting: Cardiology

## 2022-10-14 ENCOUNTER — Encounter: Payer: Self-pay | Admitting: Nephrology

## 2022-10-14 VITALS — BP 92/84 | HR 89 | Ht <= 58 in | Wt 146.8 lb

## 2022-10-14 DIAGNOSIS — I5022 Chronic systolic (congestive) heart failure: Secondary | ICD-10-CM | POA: Diagnosis not present

## 2022-10-14 DIAGNOSIS — I10 Essential (primary) hypertension: Secondary | ICD-10-CM | POA: Diagnosis not present

## 2022-10-14 DIAGNOSIS — I2510 Atherosclerotic heart disease of native coronary artery without angina pectoris: Secondary | ICD-10-CM

## 2022-10-14 MED ORDER — FLUTICASONE PROPIONATE 50 MCG/ACT NA SUSP: NASAL | 3 refills | Status: DC

## 2022-10-14 NOTE — Progress Notes (Signed)
Electrophysiology Office Note   Date:  10/14/2022   ID:  [name], [name] 02, 1957, MRN [id number]  PCP:  Lillard Anes, MD (Inactive)  Cardiologist: Agustin Cree Primary Electrophysiologist:  Cayley Pester Meredith Leeds, MD    No chief complaint on file.    History of Present Illness: [name] is a 67 y.o. female who is being seen today for the evaluation of ischemic cardiomyopathy at the request of Loyal Jacobson. Presenting today for electrophysiology evaluation.    She has a history significant for coronary artery disease post MI, ischemic cardiomyopathy with chronic systolic heart failure, PVCs, hypertension, hyperlipidemia.  She is status post Medtronic dual-chamber ICD with generator change 05/21/2019.  Today, denies symptoms of palpitations, chest pain, shortness of breath, orthopnea, PND, lower extremity edema, claudication, dizziness, presyncope, syncope, bleeding, or neurologic sequela. The patient is tolerating medications without difficulties.  Her daughter has been getting around the house more.  This makes her feel improved.  She has less fatigue on days that she is more active.  Aside from that she has no acute complaints.    Past Medical History:  Diagnosis Date   Abdominal pain 10/17/2020   Abnormality of gait due to impairment of balance 11/22/2020   Acute diverticulitis 10/17/2020   Admission for long-term opiate analgesic use 10/24/2019   Arthritis of right hip 05/29/2016   Formatting of this note might be different from the original. Added automatically from request for surgery [id number]   BMI 26.0-26.9,adult 03/29/2020   Bronchial asthma    Cardiomyopathy ([place])    Overview:  Ejection fraction 45% in 2015 Ejection fraction 30 to 35% in November 2018   Chronic back pain    Chronic constipation 07/31/2021   Chronic hypoxemic respiratory failure ([place]) 10/24/2019   Chronic narcotic use 03/23/2015   Chronic pain of both knees 12/21/2018   Added  automatically from request for surgery [id number]  Formatting of this note might be different from the original. 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[place]) 07/13/2020   Hypotension 05/17/2020   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   Ileus following gastrointestinal surgery ([place]) 03/26/2015   Major depressive disorder, single episode, moderate ([place]) 10/24/2019   Malnutrition of moderate degree ([place]) 10/20/2020   Mesenteric ischemia ([place]) 10/16/2020   Migraine without aura with status migrainosus 10/24/2019   Mixed incontinence 10/20/2020   Myocardial infarction (HCC)    Nausea & vomiting    Obstructive chronic bronchitis with exacerbation ([place]) 10/25/2019   Other spondylosis with radiculopathy, lumbar region 10/24/2019   Persistent vomiting 05/17/2020   Presence of left artificial hip joint 10/24/2019   PTSD (post-traumatic stress disorder)    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 03/23/2015 04/19/2014   Senile osteoporosis 10/24/2019   Sepsis ([place]) 10/17/2020   Serosanguineous chronic otitis media of  right ear 08/02/2021   Small bowel obstruction (HCC)    Thyroid disease    Upper respiratory tract infection due  to COVID-19 virus 07/06/2021   Wellness examination 03/27/2021   Past Surgical History:  Procedure Laterality Date   APPENDECTOMY     CARDIAC CATHETERIZATION     CARPAL TUNNEL RELEASE     CESAREAN SECTION     CHF s/p AICD   12/2010   CHOLECYSTECTOMY     COLONOSCOPY  10/16/2015   Mild colonic diverticulosis, predominantly in the left colon. Small internal hemrrhoids. Otherwise normal colonoscopy   COLONOSCOPY WITH PROPOFOL N/A 09/27/2021   Procedure: COLONOSCOPY WITH PROPOFOL;  Surgeon: Mauri Pole, MD;  Location: [facility];  Service: Endoscopy;  Laterality: N/A;   CORONARY ANGIOPLASTY     ESOPHAGOGASTRODUODENOSCOPY  02/04/2014   Mild gastritis. Otherwise normal EGD   HAND SURGERY     ICD GENERATOR CHANGEOUT N/A 05/21/2019   Procedure: ICD GENERATOR CHANGEOUT;  Surgeon: Constance Haw, MD;  Location: [facility];  Service: Cardiovascular;  Laterality: N/A;   ICD IMPLANT     Medtronic   intestinal blockage 2011     LAPAROSCOPIC ASSISTED VENTRAL HERNIA REPAIR N/A 03/23/2015   Procedure: LAPAROSCOPIC VENTRAL WALL HERNIA REPAIR;  Surgeon: Michael Boston, MD;  Location: [facility];  Service: General;  Laterality: N/A;  With MESH   LAPAROSCOPIC LYSIS OF ADHESIONS N/A 03/23/2015   Procedure: LAPAROSCOPIC LYSIS OF ADHESIONS;  Surgeon: Michael Boston, MD;  Location: [facility];  Service: General;  Laterality: N/A;   LSCS      x2   NASAL SEPTUM SURGERY     NECK SURGERY     fused   TONSILLECTOMY     ULNAR NERVE TRANSPOSITION  01/23/2012   Procedure: ULNAR NERVE DECOMPRESSION/TRANSPOSITION;  Surgeon: Ophelia Charter, MD;  Location: [facility];  Service: Neurosurgery;  Laterality: Left;  LEFT ulnar nerve decompression     Current Outpatient Medications  Medication Sig Dispense Refill   amoxicillin-clavulanate (AUGMENTIN) 875-125 MG tablet Take 1 tablet by mouth 2 (two) times daily. 20 tablet 0   ARIPiprazole (ABILIFY) 2 MG tablet TAKE ONE TABLET BY MOUTH ONCE DAILY 90 tablet 2    aspirin 81 MG tablet Take 81 mg by mouth daily.     Buprenorphine HCl (BELBUCA) 900 MCG FILM Place 900 mcg inside cheek every 12 (twelve) hours.     carvedilol (COREG) 3.125 MG tablet Take 1 tablet (3.125 mg total) by mouth 2 (two) times daily with a meal. 180 tablet 2   denosumab (PROLIA) 60 MG/ML SOSY injection Inject 60 mg into the skin every 6 (six) months. 180 mL 2   diazepam (VALIUM) 5 MG tablet TAKE ONE TABLET BY MOUTH THREE TIMES DAILY AS NEEDED FOR ANXIETY 90 tablet 2   diclofenac Sodium (VOLTAREN) 1 % GEL Apply 2 g topically 4 (four) times daily. (Patient taking differently: Apply 2 g topically daily as needed (Pain).) 50 g 4   EPINEPHRINE 0.3 mg/0.3 mL IJ SOAJ injection Inject 0.3 mLs (0.3 mg total) into the muscle as needed for anaphylaxis. 1 each 2   Erenumab-aooe (AIMOVIG) 140 MG/ML SOAJ Inject 178m into THE SKIN every 28 DAYS 1 mL 3   fluticasone (FLONASE) 50 MCG/ACT nasal spray Instill TWO SPRAYS into BOTH nostrils daily AS NEEDED FOR congestion 18 g 3   fluticasone furoate-vilanterol (BREO ELLIPTA) 100-25 MCG/ACT AEPB INHALE 1 PUFF BY MOUTH INTO LUNGS ONCE DAILY in THE afternoon 60 each 2  fluticasone furoate-vilanterol (BREO ELLIPTA) 200-25 MCG/ACT AEPB Inhale 1 puff into the lungs daily. INHALE 1 PUFF BY MOUTH INTO LUNGS DAILY in THE afternoon Strength: 100-25 MCG/ACT 60 each 5   furosemide (LASIX) 20 MG tablet Take 1 tablet (20 mg total) by mouth 2 (two) times daily. 180 tablet 1   gabapentin (NEURONTIN) 800 MG tablet Take 1 tablet (800 mg total) by mouth 3 (three) times daily. Patient usually takes twice a day (Patient taking differently: Take 800 mg by mouth 3 (three) times daily.) 270 tablet 2   guaiFENesin-codeine (CHERATUSSIN AC) 100-10 MG/5ML syrup Take 5 mLs by mouth 3 (three) times daily as needed. 120 mL 0   ipratropium-albuterol (DUONEB) 0.5-2.5 (3) MG/3ML SOLN Use 1 vial in nebulizer every 6 (six) hours as needed for shortness of breath 3 mL 11   levocetirizine  (XYZAL) 5 MG tablet Take 5 mg by mouth daily as needed for allergies.     megestrol (MEGACE) 20 MG tablet TAKE ONE TABLET BY MOUTH ONCE DAILY 90 tablet 2   Multiple Vitamin (MULTIVITAMIN WITH MINERALS) TABS tablet Take 1 tablet by mouth daily.     naloxegol oxalate (MOVANTIK) 12.5 MG TABS tablet Take 1 tablet (12.5 mg total) by mouth daily. 30 tablet 5   naloxone (NARCAN) nasal spray 4 mg/0.1 mL Place 1 spray into the nose once.     nitrofurantoin, macrocrystal-monohydrate, (MACROBID) 100 MG capsule Take 100 mg by mouth 2 (two) times daily.     nitroGLYCERIN (NITROSTAT) 0.4 MG SL tablet Place 1 tablet (0.4 mg total) under the tongue every 5 (five) minutes as needed for chest pain. 25 tablet 6   ondansetron (ZOFRAN) 4 MG tablet Take 1 tablet (4 mg total) by mouth every 8 (eight) hours as needed for nausea or vomiting. 20 tablet 0   oxyCODONE (ROXICODONE) 15 MG immediate release tablet Take 15 mg by mouth 3 (three) times daily as needed for pain.     predniSONE (DELTASONE) 10 MG tablet 10 mg daily with breakfast.     ranolazine (RANEXA) 500 MG 12 hr tablet TAKE ONE TABLET BY MOUTH TWICE DAILY 180 tablet 3   rosuvastatin (CRESTOR) 20 MG tablet Take 1 tablet (20 mg total) by mouth daily. 90 tablet 1   sacubitril-valsartan (ENTRESTO) 24-26 MG Take 1 tablet by mouth 2 (two) times daily. 180 tablet 2   sertraline (ZOLOFT) 50 MG tablet TAKE 1 TABLET BY MOUTH DAILY (Patient taking differently: Take 50 mg by mouth daily.) 90 tablet 2   tiZANidine (ZANAFLEX) 4 MG tablet Take 4 mg by mouth at bedtime.     topiramate (TOPAMAX) 50 MG tablet TAKE 2 TABLETS(100 MG) BY MOUTH AT BEDTIME (Patient taking differently: Take 100 mg by mouth at bedtime. TAKE 2 TABLETS(100 MG) BY MOUTH AT BEDTIME) 180 tablet 0   Ubrogepant (UBRELVY) 100 MG TABS Take 100 mg by mouth as needed ([name] repeat after 2 hours.  Maximum 2 tablets in 24 hours). TAKE 1 TABLET BY MOUTH BY MOUTH AS NEEDED( [name] REPEAT 1 TABLET AFTER 2 HOURS IF NEEDED,  MAXIMUM 2 TABLETS IN 24 HOURS) Strength: 100 mg 16 tablet 5   No current facility-administered medications for this visit.    Allergies:   Patient has no known allergies.   Social History:  The patient  reports that she quit smoking about 2 years ago. Her smoking use included cigarettes. She has a 15.00 pack-year smoking history. She has never used smokeless tobacco. She reports current alcohol use of about  2.0 standard drinks of alcohol per week. She reports that she does not use drugs.   Family History:  The patient's family history includes Alcohol abuse in her father; Cancer in an other family member; Heart attack in her father, mother, and another family member; Heart failure in an other family member; Hypertension in her father and mother.   ROS:  Please see the history of present illness.   Otherwise, review of systems is positive for none.   All other systems are reviewed and negative.   PHYSICAL EXAM: VS:  BP 92/84   Pulse 89   Ht 4' 9"$  (1.448 m)   Wt 146 lb 12.8 oz (66.6 kg)   SpO2 98%   BMI 31.77 kg/m  , BMI Body mass index is 31.77 kg/m. GEN: Well nourished, well developed, in no acute distress  HEENT: normal  Neck: no JVD, carotid bruits, or masses Cardiac: RRR; no murmurs, rubs, or gallops,no edema  Respiratory:  clear to auscultation bilaterally, normal work of breathing GI: soft, nontender, nondistended, + BS MS: no deformity or atrophy  Skin: warm and dry, device site well healed Neuro:  Strength and sensation are intact Psych: euthymic mood, full affect  EKG:  EKG is not ordered today. Personal review of the ekg ordered 07/05/22 shows sinus rhythm  Personal review of the device interrogation today. Results in [place]: 07/30/2022: ALT 6; BUN 44; Creatinine, Ser 2.07; Hemoglobin 9.8; Platelets 355; Potassium 3.8; Sodium 144    Lipid Panel     Component Value Date/Time   CHOL 154 03/20/2022 1130   TRIG 220 (H) 03/20/2022 1130   HDL 52  03/20/2022 1130   CHOLHDL 3.0 03/20/2022 1130   LDLCALC 66 03/20/2022 1130     Wt Readings from Last 3 Encounters:  10/14/22 146 lb 12.8 oz (66.6 kg)  09/19/22 150 lb (68 kg)  07/03/22 150 lb (68 kg)      Other studies Reviewed: Additional studies/ records that were reviewed today include: TTE 11/09/20  Review of the above records today demonstrates:   1. Left ventricular ejection fraction, by estimation, is 30 to 35%. The  left ventricle has severely decreased function. The left ventricle  demonstrates global hypokinesis. There is moderate concentric left  ventricular hypertrophy. Left ventricular  diastolic parameters are consistent with Grade I diastolic dysfunction  (impaired relaxation).   2. Right ventricular systolic function is mildly reduced. The right  ventricular size is normal. There is normal pulmonary artery systolic  pressure.   3. The mitral valve is normal in structure. Mild to moderate mitral valve  regurgitation. No evidence of mitral stenosis.   4. The aortic valve is tricuspid. Aortic valve regurgitation is not  visualized. No aortic stenosis is present.   5. The inferior vena cava is normal in size with greater than 50%  respiratory variability, suggesting right atrial pressure of 3 mmHg.   Cardiac monitor 03/13/2021 personally reviewed Patient had a min HR of 59 bpm, max HR of 119 bpm, and avg HR of 75 bpm. Predominant underlying rhythm was Possible Atrial Pacing. Isolated SVEs were rare (<1.0%), SVE Couplets were rare (<1.0%), and no SVE Triplets were present. Isolated VEs were  frequent (5.6%, 56076), VE Couplets were occasional (1.0%, 5113), and VE Triplets were rare (<1.0%, 42). Ventricular Bigeminy and Trigeminy were present  ASSESSMENT AND PLAN:  1.  Chronic systolic heart failure: Due to hemic cardiomyopathy.  Currently on optimal medical therapy.  Status post Medtronic ICD with  generator change 05/21/2019.  Impedance, threshold, sensing within normal  limits and unchanged.  No changes at this time.  2.  Hypertension:well controlled  3.  Hyperlipidemia: Continue statin per primary care  4.  Coronary artery disease: Has chronic stable angina.  Plan per primary cardiology.  5.  PVCs: 5.6% burden on cardiac monitor.  Continue with current management.  Asymptomatic.    Current medicines are reviewed at length with the patient today.   The patient does not have concerns regarding her medicines.  The following changes were made today: none  Labs/ tests ordered today include:  No orders of the defined types were placed in this encounter.   Disposition:   FU 12 months  Signed, Esmeralda Blanford Meredith Leeds, MD  10/14/2022 12:28 PM     [facility] [address] [place] [address] [phone] (office) [phone] (fax)

## 2022-10-14 NOTE — Patient Instructions (Signed)
Medication Instructions:  Your physician recommends that you continue on your current medications as directed. Please refer to the Current Medication list given to you today.  *If you need a refill on your cardiac medications before your next appointment, please call your pharmacy*   Lab Work: None ordered If you have labs (blood work) drawn today and your tests are completely normal, you will receive your results only by: . MyChart Message (if you have MyChart) OR . A paper copy in the mail If you have any lab test that is abnormal or we need to change your treatment, we will call you to review the results.   Testing/Procedures: None ordered   Follow-Up: At [facility], you and your health needs are our priority.  As part of our continuing mission to provide you with exceptional heart care, we have created designated Provider Care Teams.  These Care Teams include your primary Cardiologist (physician) and Advanced Practice Providers (APPs -  Physician Assistants and Nurse Practitioners) who all work together to provide you with the care you need, when you need it.  We recommend signing up for the patient portal called "MyChart".  Sign up information is provided on this After Visit Summary.  MyChart is used to connect with patients for Virtual Visits (Telemedicine).  Patients are able to view lab/test results, encounter notes, upcoming appointments, etc.  Non-urgent messages can be sent to your provider as well.   To learn more about what you can do with MyChart, go to [email or web address].    Your next appointment:   1 year(s)  The format for your next appointment:   In Person  Provider:   Will Camnitz, MD   Thank you for choosing [facility]!!   Chanita Boden, RN [phone]    Other Instructions    

## 2022-10-18 ENCOUNTER — Other Ambulatory Visit: Payer: Self-pay | Admitting: Family Medicine

## 2022-10-18 DIAGNOSIS — E785 Hyperlipidemia, unspecified: Secondary | ICD-10-CM

## 2022-10-21 ENCOUNTER — Other Ambulatory Visit: Payer: Self-pay

## 2022-10-21 MED ORDER — TOPIRAMATE 50 MG PO TABS: ORAL_TABLET | ORAL | 0 refills | Status: DC

## 2022-10-24 ENCOUNTER — Other Ambulatory Visit: Payer: Self-pay | Admitting: Family Medicine

## 2022-11-04 DIAGNOSIS — H6591 Unspecified nonsuppurative otitis media, right ear: Secondary | ICD-10-CM | POA: Diagnosis not present

## 2022-11-04 DIAGNOSIS — H6991 Unspecified Eustachian tube disorder, right ear: Secondary | ICD-10-CM | POA: Diagnosis not present

## 2022-11-04 DIAGNOSIS — Z9622 Myringotomy tube(s) status: Secondary | ICD-10-CM | POA: Diagnosis not present

## 2022-11-04 NOTE — Progress Notes (Deleted)
NEUROLOGY FOLLOW UP OFFICE NOTE  PYPER DAGES [id number]  Assessment/Plan:   Chronic migraine without aura, without status migrainosus, not intractable ***     1.Migraine prevention:  Aimovig '140mg'$  every 28 days; topiramate '100mg'$  QHS *** 2.Migraine rescue:  Ubrelvy '100mg'$  *** 3.Limit use of pain relievers to no more than 2 days out of week to prevent risk of rebound or medication-overuse headache. 4. Keep headache diary 5. Follow up ***  Subjective:  Gilbert Bittenbender is a 67 year old Caucasian woman with CHF with EF 30-35%, s/p AICD, COPD, chronic back pain, chronic knee pain, arthritis, HTN, hyperlipidemia and PTSD and history of MI and former smoker and history of childhood seizures who follows up for migraines and dizziness.   UPDATE: ***  Intensity:  severe Duration:  1 hour if takes Ubrelvy immediately.   Frequency:  once a month Frequency of abortive medication:once a month Current NSAIDS:  ASA '81mg'$ ; Celebrex Current analgesics:  oxycodone (for back pain - takes almost), Belbuca (for back pain) Current triptans:  none Current ergotamine:  none Current anti-emetic:  none Current muscle relaxants:  none Current anti-anxiolytic:  Valium Current sleep aide:  none Current Antihypertensive medications:  Coreg, Lasix Current Antidepressant medications:  sertraline Current Anticonvulsant medications:  Topiramate '100mg'$  at bedtime; gabapentin '800mg'$  twice daily Current anti-CGRP:  Aimovig '140mg'$ , Ubrelvy '100mg'$  Current Vitamins/Herbal/Supplements:  none Current Antihistamines/Decongestants:  Flonase Other therapy:  none Hormone/birth control:  none   Caffeine:  1 cup of coffee daily Alcohol:  no Smoker: former Diet: 100 oz water daily.  No soda. Exercise: no Pain:  back pain, chronic   HISTORY: She has had headaches since around 2017 but gradually got worse, particularly since November 2019.  They are severe bifrontal pounding/squeezing headache.  There is no aura.  They  are associated with photophobia, phonophobia, blurred vision and dizziness.  They typically last 1 hour and occur 5 to 6 a day every day.  They are triggered by loud noise.  Sumatriptan helps relieve the intensity but does not abort it..   Since November 2019, she also has had episodes of dizziness outside of the dizziness as well, causing her to fall.  She reports sudden spinning sensation lasting a few seconds and resolves.  She says it is not positional.  She has associated nausea and blurred vision.  She says it occurs 2 to 3 times a day.     She had a CT of head without contrast on 11/02/18 which was personally reviewed and was unremarkable, demonstrating mild atrophy and mild chronic small vessel ischemic changes but no acute intracranial abnormality.   She is treated by pain management for chronic pain.  She has degenerative disc disease of the cervical spine with previous neck fusion.  She has history of total right hip replacement.  She has chronic low back pain with radicular symptoms.  She has bilateral knee pain.   Past NSAIDS:  none Past analgesics:  Tylenol (contraindicated due to CHF) Past abortive triptans: sumatriptan '25mg'$  Past abortive ergotamine:  none Past muscle relaxants:  none Past anti-emetic:  none Past antihypertensive medications:  lisinopril Past antidepressant medications:  none Past anticonvulsant medications:  none Past anti-CGRP:  none Past vitamins/Herbal/Supplements:  none Past antihistamines/decongestants:  none Other past therapies:  none  PAST MEDICAL HISTORY: Past Medical History:  Diagnosis Date   Abdominal pain 10/17/2020   Abnormality of gait due to impairment of balance 11/22/2020   Acute diverticulitis 10/17/2020   Admission for long-term opiate  analgesic use 10/24/2019   Arthritis of right hip 05/29/2016   Formatting of this note might be different from the original. Added automatically from request for surgery [id number]   BMI 26.0-26.9,adult  03/29/2020   Bronchial asthma    Cardiomyopathy ([place])    Overview:  Ejection fraction 45% in 2015 Ejection fraction 30 to 35% in November 2018   Chronic back pain    Chronic constipation 07/31/2021   Chronic hypoxemic respiratory failure ([place]) 10/24/2019   Chronic narcotic use 03/23/2015   Chronic pain of both knees 12/21/2018   Added automatically from request for surgery [id number]  Formatting of this note might be different from the original. 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   Dyslipidemia 05/31/2015   GERD (gastroesophageal reflux disease)    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[place]) 07/13/2020   Hypotension 05/17/2020   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   Ileus following gastrointestinal surgery ([place]) 03/26/2015   Major depressive disorder, single episode, moderate ([place]) 10/24/2019   Malnutrition of moderate degree ([place]) 10/20/2020   Mesenteric ischemia ([place]) 10/16/2020   Migraine without aura with status migrainosus 10/24/2019   Mixed incontinence 10/20/2020   Myocardial infarction (H[facility])    Nausea & vomiting    Obstructive chronic bronchitis with exacerbation ([place]) 10/25/2019   Other spondylosis with radiculopathy, lumbar region 10/24/2019   Persistent vomiting 05/17/2020   Presence of left artificial hip joint 10/24/2019   PTSD  (post-traumatic stress disorder)    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 03/23/2015 04/19/2014   Senile osteoporosis 10/24/2019   Sepsis ([place]) 10/17/2020   Serosanguineous chronic otitis media of right ear 08/02/2021   Small bowel obstruction (HCC)    Thyroid disease    Upper respiratory tract infection due to COVID-19 virus 07/06/2021   Wellness examination 03/27/2021    MEDICATIONS: Current Outpatient Medications on File Prior to Visit  Medication Sig Dispense Refill   amoxicillin-clavulanate (AUGMENTIN) 875-125 MG tablet Take 1 tablet by mouth 2 (two) times daily. 20 tablet 0   ARIPiprazole (ABILIFY) 2 MG tablet TAKE ONE TABLET BY MOUTH ONCE DAILY 90 tablet 2   aspirin 81 MG tablet Take 81 mg by mouth daily.     Buprenorphine HCl (BELBUCA) 900 MCG FILM Place 900 mcg inside cheek every 12 (twelve) hours.     carvedilol (COREG) 3.125 MG tablet Take 1 tablet (3.125 mg total) by mouth 2 (two) times daily with a meal. 180 tablet 2   denosumab (PROLIA) 60 MG/ML SOSY injection Inject 60 mg into the skin every 6 (six) months. 180 mL 2   diazepam (VALIUM) 5 MG tablet TAKE ONE TABLET BY MOUTH THREE TIMES DAILY AS NEEDED FOR ANXIETY 90 tablet 2   diclofenac Sodium (VOLTAREN) 1 % GEL Apply 2 g topically 4 (four) times daily. (Patient taking differently: Apply 2 g topically daily as needed (Pain).) 50 g 4   EPINEPHRINE 0.3 mg/0.3 mL IJ  SOAJ injection Inject 0.3 mLs (0.3 mg total) into the muscle as needed for anaphylaxis. 1 each 2   Erenumab-aooe (AIMOVIG) 140 MG/ML SOAJ Inject '140mg'$  into THE SKIN every 28 DAYS 1 mL 3   esomeprazole (NEXIUM) 20 MG capsule TAKE ONE CAPSULE BY MOUTH ONCE DAILY 90 capsule 1   fluticasone (FLONASE) 50 MCG/ACT nasal spray Instill TWO SPRAYS into BOTH nostrils daily AS NEEDED FOR congestion 18 g 3   fluticasone furoate-vilanterol (BREO ELLIPTA) 100-25 MCG/ACT AEPB INHALE 1 PUFF BY MOUTH INTO LUNGS ONCE DAILY in THE afternoon 60 each 2    fluticasone furoate-vilanterol (BREO ELLIPTA) 200-25 MCG/ACT AEPB Inhale 1 puff into the lungs daily. INHALE 1 PUFF BY MOUTH INTO LUNGS DAILY in THE afternoon Strength: 100-25 MCG/ACT 60 each 5   furosemide (LASIX) 20 MG tablet Take 1 tablet (20 mg total) by mouth 2 (two) times daily. 180 tablet 1   gabapentin (NEURONTIN) 800 MG tablet Take 1 tablet (800 mg total) by mouth 3 (three) times daily. Patient usually takes twice a day (Patient taking differently: Take 800 mg by mouth 3 (three) times daily.) 270 tablet 2   guaiFENesin-codeine (CHERATUSSIN AC) 100-10 MG/5ML syrup Take 5 mLs by mouth 3 (three) times daily as needed. 120 mL 0   ipratropium-albuterol (DUONEB) 0.5-2.5 (3) MG/3ML SOLN Use 1 vial in nebulizer every 6 (six) hours as needed for shortness of breath 3 mL 11   levocetirizine (XYZAL) 5 MG tablet Take 5 mg by mouth daily as needed for allergies.     megestrol (MEGACE) 20 MG tablet TAKE ONE TABLET BY MOUTH ONCE DAILY 90 tablet 2   Multiple Vitamin (MULTIVITAMIN WITH MINERALS) TABS tablet Take 1 tablet by mouth daily.     naloxegol oxalate (MOVANTIK) 12.5 MG TABS tablet Take 1 tablet (12.5 mg total) by mouth daily. 30 tablet 5   naloxone (NARCAN) nasal spray 4 mg/0.1 mL Place 1 spray into the nose once.     nitrofurantoin, macrocrystal-monohydrate, (MACROBID) 100 MG capsule Take 100 mg by mouth 2 (two) times daily.     nitroGLYCERIN (NITROSTAT) 0.4 MG SL tablet Place 1 tablet (0.4 mg total) under the tongue every 5 (five) minutes as needed for chest pain. 25 tablet 6   ondansetron (ZOFRAN) 4 MG tablet Take 1 tablet (4 mg total) by mouth every 8 (eight) hours as needed for nausea or vomiting. 20 tablet 0   oxyCODONE (ROXICODONE) 15 MG immediate release tablet Take 15 mg by mouth 3 (three) times daily as needed for pain.     predniSONE (DELTASONE) 10 MG tablet 10 mg daily with breakfast.     ranolazine (RANEXA) 500 MG 12 hr tablet TAKE ONE TABLET BY MOUTH TWICE DAILY 180 tablet 3    rosuvastatin (CRESTOR) 20 MG tablet TAKE ONE TABLET BY MOUTH ONCE DAILY 90 tablet 1   sacubitril-valsartan (ENTRESTO) 24-26 MG Take 1 tablet by mouth 2 (two) times daily. 180 tablet 2   sertraline (ZOLOFT) 50 MG tablet TAKE 1 TABLET BY MOUTH DAILY (Patient taking differently: Take 50 mg by mouth daily.) 90 tablet 2   tiZANidine (ZANAFLEX) 4 MG tablet Take 4 mg by mouth at bedtime.     topiramate (TOPAMAX) 50 MG tablet TAKE 2 TABLETS(100 MG) BY MOUTH AT BEDTIME 180 tablet 0   Ubrogepant (UBRELVY) 100 MG TABS Take 100 mg by mouth as needed (May repeat after 2 hours.  Maximum 2 tablets in 24 hours). TAKE 1 TABLET BY MOUTH BY MOUTH AS NEEDED( MAY REPEAT  1 TABLET AFTER 2 HOURS IF NEEDED, MAXIMUM 2 TABLETS IN 24 HOURS) Strength: 100 mg 16 tablet 5   No current facility-administered medications on file prior to visit.    ALLERGIES: No Known Allergies  FAMILY HISTORY: Family History  Problem Relation Age of Onset   Hypertension Mother    Heart attack Mother    Alcohol abuse Father    Hypertension Father    Heart attack Father    Heart attack Other    Cancer Other    Heart failure Other    Anesthesia problems Neg Hx    Hypotension Neg Hx    Malignant hyperthermia Neg Hx    Pseudochol deficiency Neg Hx    Breast cancer Neg Hx       Objective:  *** General: No acute distress.  Patient appears ***-groomed.   Head:  Normocephalic/atraumatic Eyes:  Fundi examined but not visualized Neck: supple, no paraspinal tenderness, full range of motion Heart:  Regular rate and rhythm Lungs:  Clear to auscultation bilaterally Back: No paraspinal tenderness Neurological Exam: alert and oriented to person, place, and time.  Speech fluent and not dysarthric, language intact.  CN II-XII intact. Bulk and tone normal, muscle strength 5/5 throughout.  Sensation to light touch intact.  Deep tendon reflexes 2+ throughout, toes downgoing.  Finger to nose testing intact.  Gait normal, Romberg  negative.   Metta Clines, DO  CC: ***

## 2022-11-05 ENCOUNTER — Encounter: Payer: Self-pay | Admitting: Neurology

## 2022-11-05 ENCOUNTER — Ambulatory Visit: Payer: 59 | Admitting: Neurology

## 2022-11-07 ENCOUNTER — Other Ambulatory Visit: Payer: Self-pay

## 2022-11-07 ENCOUNTER — Other Ambulatory Visit: Payer: Self-pay | Admitting: Neurology

## 2022-11-07 DIAGNOSIS — F5101 Primary insomnia: Secondary | ICD-10-CM

## 2022-11-07 MED ORDER — DIAZEPAM 5 MG PO TABS: 5.0000 mg | ORAL_TABLET | Freq: Three times a day (TID) | ORAL | 1 refills | Status: DC | PRN

## 2022-11-07 NOTE — Telephone Encounter (Signed)
LMOVm for patient, refill request recevied. Please call the office to schedule a visit to get further refills.   Front desk let me know when she schedules so I can send in enough refills to last.

## 2022-11-07 NOTE — Telephone Encounter (Signed)
Patient has been dismissed from our office. The dismissal letter was sent out on 11-05-22. Due to No Shows

## 2022-11-07 NOTE — Telephone Encounter (Signed)
Pt left message with after hour service on 11-07-22 at 12:36 pm  Caller states that she needs a refill on medication until her next appt she did not say what the name of the medication is

## 2022-11-08 ENCOUNTER — Ambulatory Visit: Payer: 59 | Attending: Cardiology | Admitting: Cardiology

## 2022-11-08 ENCOUNTER — Encounter: Payer: Self-pay | Admitting: Cardiology

## 2022-11-08 VITALS — BP 110/74 | HR 79 | Ht 60.0 in | Wt 150.2 lb

## 2022-11-08 DIAGNOSIS — I420 Dilated cardiomyopathy: Secondary | ICD-10-CM | POA: Diagnosis not present

## 2022-11-08 DIAGNOSIS — I2510 Atherosclerotic heart disease of native coronary artery without angina pectoris: Secondary | ICD-10-CM

## 2022-11-08 DIAGNOSIS — E785 Hyperlipidemia, unspecified: Secondary | ICD-10-CM

## 2022-11-08 DIAGNOSIS — Z95810 Presence of automatic (implantable) cardiac defibrillator: Secondary | ICD-10-CM

## 2022-11-08 DIAGNOSIS — J441 Chronic obstructive pulmonary disease with (acute) exacerbation: Secondary | ICD-10-CM | POA: Diagnosis not present

## 2022-11-08 NOTE — Progress Notes (Signed)
Cardiology Office Note:    Date:  11/08/2022   ID:  [name], DOB Oct 27, 1955, MRN [id number]  PCP:  Rochel Brome, MD  Cardiologist:  Jenne Campus, MD    Referring MD: Lillard Anes,*   Chief Complaint  Patient presents with   Follow-up  Well  History of Present Illness:    [name] is a 67 y.o. female with past medical history significant for cardiomyopathy which is nonischemic ejection fraction 35 to latest echocardiogram improvement to 4045%, BiV ICD, essential hypertension, fibromyalgia.  Comes today to months for follow-up.  Overall doing very well.  She denies of any chest pain tightness squeezing pressure mid chest she described to have some shortness of breath when she was doing shopping in Christmas time but after that she is doing fine.  Her daughter forced her to walk on the regular basis which I strongly encouraged.  Past Medical History:  Diagnosis Date   Abdominal pain 10/17/2020   Abnormality of gait due to impairment of balance 11/22/2020   Acute diverticulitis 10/17/2020   Admission for long-term opiate analgesic use 10/24/2019   Arthritis of right hip 05/29/2016   Formatting of this note might be different from the original. Added automatically from request for surgery [id number]   BMI 26.0-26.9,adult 03/29/2020   Bronchial asthma    Cardiomyopathy ([place])    Overview:  Ejection fraction 45% in 2015 Ejection fraction 30 to 35% in November 2018   Chronic back pain    Chronic constipation 07/31/2021   Chronic hypoxemic respiratory failure ([place]) 10/24/2019   Chronic narcotic use 03/23/2015   Chronic pain of both knees 12/21/2018   Added automatically from request for surgery [id number]  Formatting of this note might be different from the original. 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[place]) 07/13/2020   Hypotension 05/17/2020   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   Ileus following gastrointestinal surgery ([place]) 03/26/2015   Major depressive disorder, single episode, moderate ([place]) 10/24/2019   Malnutrition of moderate degree ([place]) 10/20/2020   Mesenteric ischemia ([place]) 10/16/2020   Migraine without aura with status migrainosus 10/24/2019   Mixed incontinence 10/20/2020   Myocardial infarction (H[facility])    Nausea & vomiting    Obstructive chronic bronchitis with exacerbation ([place]) 10/25/2019   Other spondylosis with radiculopathy, lumbar region 10/24/2019   Persistent vomiting 05/17/2020   Presence of left artificial hip joint 10/24/2019   PTSD (post-traumatic stress disorder)    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 03/23/2015 04/19/2014   Senile osteoporosis 10/24/2019   Sepsis ([place]) 10/17/2020   Serosanguineous chronic otitis media of right ear 08/02/2021   Small bowel obstruction ([place])    Thyroid disease    Upper respiratory tract infection due to COVID-19 virus 07/06/2021   Wellness examination 03/27/2021  Past Surgical History:  Procedure Laterality Date   APPENDECTOMY     CARDIAC CATHETERIZATION     CARPAL TUNNEL RELEASE     CESAREAN SECTION     CHF s/p AICD   12/2010   CHOLECYSTECTOMY     COLONOSCOPY  10/16/2015   Mild colonic  diverticulosis, predominantly in the left colon. Small internal hemrrhoids. Otherwise normal colonoscopy   COLONOSCOPY WITH PROPOFOL N/A 09/27/2021   Procedure: COLONOSCOPY WITH PROPOFOL;  Surgeon: Mauri Pole, MD;  Location: [facility];  Service: Endoscopy;  Laterality: N/A;   CORONARY ANGIOPLASTY     ESOPHAGOGASTRODUODENOSCOPY  02/04/2014   Mild gastritis. Otherwise normal EGD   HAND SURGERY     ICD GENERATOR CHANGEOUT N/A 05/21/2019   Procedure: ICD GENERATOR CHANGEOUT;  Surgeon: Constance Haw, MD;  Location: [facility];  Service: Cardiovascular;  Laterality: N/A;   ICD IMPLANT     Medtronic   intestinal blockage 2011     LAPAROSCOPIC ASSISTED VENTRAL HERNIA REPAIR N/A 03/23/2015   Procedure: LAPAROSCOPIC VENTRAL WALL HERNIA REPAIR;  Surgeon: Michael Boston, MD;  Location: [facility];  Service: General;  Laterality: N/A;  With MESH   LAPAROSCOPIC LYSIS OF ADHESIONS N/A 03/23/2015   Procedure: LAPAROSCOPIC LYSIS OF ADHESIONS;  Surgeon: Michael Boston, MD;  Location: [facility];  Service: General;  Laterality: N/A;   LSCS      x2   NASAL SEPTUM SURGERY     NECK SURGERY     fused   TONSILLECTOMY     ULNAR NERVE TRANSPOSITION  01/23/2012   Procedure: ULNAR NERVE DECOMPRESSION/TRANSPOSITION;  Surgeon: Ophelia Charter, MD;  Location: [facility];  Service: Neurosurgery;  Laterality: Left;  LEFT ulnar nerve decompression    Current Medications: Current Meds  Medication Sig   amoxicillin-clavulanate (AUGMENTIN) 875-125 MG tablet Take 1 tablet by mouth 2 (two) times daily.   ARIPiprazole (ABILIFY) 2 MG tablet TAKE ONE TABLET BY MOUTH ONCE DAILY   Buprenorphine HCl (BELBUCA) 900 MCG FILM Place 900 mcg inside cheek every 12 (twelve) hours.   carvedilol (COREG) 3.125 MG tablet Take 1 tablet (3.125 mg total) by mouth 2 (two) times daily with a meal.   denosumab (PROLIA) 60 MG/ML SOSY injection Inject 60 mg into the skin every 6 (six) months.   diazepam (VALIUM) 5 MG tablet Take  1 tablet (5 mg total) by mouth 3 (three) times daily as needed. for anxiety   diclofenac Sodium (VOLTAREN) 1 % GEL Apply 2 g topically 4 (four) times daily. (Patient taking differently: Apply 2 g topically daily as needed (Pain).)   EPINEPHRINE 0.3 mg/0.3 mL IJ SOAJ injection Inject 0.3 mLs (0.3 mg total) into the muscle as needed for anaphylaxis.   Erenumab-aooe (AIMOVIG) 140 MG/ML SOAJ Inject 140mg  into THE SKIN every 28 DAYS   esomeprazole (NEXIUM) 20 MG capsule TAKE ONE CAPSULE BY MOUTH ONCE DAILY   fluticasone (FLONASE) 50 MCG/ACT nasal spray Instill TWO SPRAYS into BOTH nostrils daily AS NEEDED FOR congestion   fluticasone furoate-vilanterol (BREO ELLIPTA) 100-25 MCG/ACT AEPB INHALE 1 PUFF BY MOUTH INTO LUNGS ONCE DAILY in THE afternoon   fluticasone furoate-vilanterol (BREO ELLIPTA) 200-25 MCG/ACT AEPB Inhale 1 puff into the lungs daily. INHALE 1 PUFF BY MOUTH INTO LUNGS DAILY in THE afternoon Strength: 100-25 MCG/ACT   furosemide (LASIX) 20 MG tablet Take 1 tablet (20 mg total) by mouth 2 (two) times daily.   gabapentin (NEURONTIN) 800 MG tablet Take 1 tablet (800 mg total) by mouth 3 (  three) times daily. Patient usually takes twice a day (Patient taking differently: Take 800 mg by mouth 3 (three) times daily.)   guaiFENesin-codeine (CHERATUSSIN AC) 100-10 MG/5ML syrup Take 5 mLs by mouth 3 (three) times daily as needed.   ipratropium-albuterol (DUONEB) 0.5-2.5 (3) MG/3ML SOLN Use 1 vial in nebulizer every 6 (six) hours as needed for shortness of breath   levocetirizine (XYZAL) 5 MG tablet Take 5 mg by mouth daily as needed for allergies.   megestrol (MEGACE) 20 MG tablet TAKE ONE TABLET BY MOUTH ONCE DAILY   Multiple Vitamin (MULTIVITAMIN WITH MINERALS) TABS tablet Take 1 tablet by mouth daily.   naloxegol oxalate (MOVANTIK) 12.5 MG TABS tablet Take 1 tablet (12.5 mg total) by mouth daily.   naloxone (NARCAN) nasal spray 4 mg/0.1 mL Place 1 spray into the nose once.   nitrofurantoin,  macrocrystal-monohydrate, (MACROBID) 100 MG capsule Take 100 mg by mouth 2 (two) times daily.   nitroGLYCERIN (NITROSTAT) 0.4 MG SL tablet Place 1 tablet (0.4 mg total) under the tongue every 5 (five) minutes as needed for chest pain.   ondansetron (ZOFRAN) 4 MG tablet Take 1 tablet (4 mg total) by mouth every 8 (eight) hours as needed for nausea or vomiting.   oxyCODONE (ROXICODONE) 15 MG immediate release tablet Take 15 mg by mouth 3 (three) times daily as needed for pain.   predniSONE (DELTASONE) 10 MG tablet 10 mg daily with breakfast.   ranolazine (RANEXA) 500 MG 12 hr tablet TAKE ONE TABLET BY MOUTH TWICE DAILY   rosuvastatin (CRESTOR) 20 MG tablet TAKE ONE TABLET BY MOUTH ONCE DAILY   sacubitril-valsartan (ENTRESTO) 24-26 MG Take 1 tablet by mouth 2 (two) times daily.   sertraline (ZOLOFT) 50 MG tablet TAKE 1 TABLET BY MOUTH DAILY (Patient taking differently: Take 50 mg by mouth daily.)   tiZANidine (ZANAFLEX) 4 MG tablet Take 4 mg by mouth at bedtime.   topiramate (TOPAMAX) 50 MG tablet TAKE 2 TABLETS(100 MG) BY MOUTH AT BEDTIME   Ubrogepant (UBRELVY) 100 MG TABS Take 100 mg by mouth as needed (May repeat after 2 hours.  Maximum 2 tablets in 24 hours). TAKE 1 TABLET BY MOUTH BY MOUTH AS NEEDED( MAY REPEAT 1 TABLET AFTER 2 HOURS IF NEEDED, MAXIMUM 2 TABLETS IN 24 HOURS) Strength: 100 mg     Allergies:   Patient has no known allergies.   Social History   Socioeconomic History   Marital status: Single    Spouse name: Not on file   Number of children: 3   Years of education: Not on file   Highest education level: Not on file  Occupational History   Occupation: disabled  Tobacco Use   Smoking status: Former    Packs/day: 0.50    Years: 30.00    Additional pack years: 0.00    Total pack years: 15.00    Types: Cigarettes    Quit date: 03/2020    Years since quitting: 2.6   Smokeless tobacco: Never  Vaping Use   Vaping Use: Never used  Substance and Sexual Activity   Alcohol  use: Yes    Alcohol/week: 2.0 standard drinks of alcohol    Types: 1 Cans of beer, 1 Standard drinks or equivalent per week    Comment: seldom   Drug use: No   Sexual activity: Not Currently  Other Topics Concern   Not on file  Social History Narrative   One level home with boyfriend   Caffeine - coffee 1-2 cups/day; [place]  tea 4-5 bottles a day   Exercise - some    Right handed      Social Determinants of Health   Financial Resource Strain: Low Risk  (03/27/2021)   Overall Financial Resource Strain (CARDIA)    Difficulty of Paying Living Expenses: Not hard at all  Food Insecurity: No Food Insecurity (10/31/2020)   Hunger Vital Sign    Worried About Running Out of Food in the Last Year: Never true    Ran Out of Food in the Last Year: Never true  Transportation Needs: Unmet Transportation Needs (03/27/2021)   PRAPARE - Hydrologist (Medical): Not on file    Lack of Transportation (Non-Medical): Yes  Physical Activity: Inactive (03/27/2021)   Exercise Vital Sign    Days of Exercise per Week: 0 days    Minutes of Exercise per Session: 0 min  Stress: Stress Concern Present (03/27/2021)   [place]    Feeling of Stress : Rather much  Social Connections: Moderately Integrated (03/27/2021)   Social Connection and Isolation Panel [NHANES]    Frequency of Communication with Friends and Family: More than three times a week    Frequency of Social Gatherings with Friends and Family: Never    Attends Religious Services: 1 to 4 times per year    Active Member of [employer] or Organizations: No    Attends Music therapist: Never    Marital Status: Married     Family History: The patient's family history includes Alcohol abuse in her father; Cancer in an other family member; Heart attack in her father, mother, and another family member; Heart failure in an other family member; Hypertension in her  father and mother. There is no history of Anesthesia problems, Hypotension, Malignant hyperthermia, Pseudochol deficiency, or Breast cancer. ROS:   Please see the history of present illness.    All 14 point review of systems negative except as described per history of present illness  EKGs/Labs/Other Studies Reviewed:      Recent Labs: 07/30/2022: ALT 6; BUN 44; Creatinine, Ser 2.07; Hemoglobin 9.8; Platelets 355; Potassium 3.8; Sodium 144  Recent Lipid Panel    Component Value Date/Time   CHOL 154 03/20/2022 1130   TRIG 220 (H) 03/20/2022 1130   HDL 52 03/20/2022 1130   CHOLHDL 3.0 03/20/2022 1130   LDLCALC 66 03/20/2022 1130    Physical Exam:    VS:  BP 110/74 (BP Location: Left Arm, Patient Position: Sitting)   Pulse 79   Ht 5' (1.524 m)   Wt 150 lb 3.2 oz (68.1 kg)   SpO2 97%   BMI 29.33 kg/m     Wt Readings from Last 3 Encounters:  11/08/22 150 lb 3.2 oz (68.1 kg)  10/14/22 146 lb 12.8 oz (66.6 kg)  09/19/22 150 lb (68 kg)     GEN:  Well nourished, well developed in no acute distress HEENT: Normal NECK: No JVD; No carotid bruits LYMPHATICS: No lymphadenopathy CARDIAC: RRR, no murmurs, no rubs, no gallops RESPIRATORY:  Clear to auscultation without rales, wheezing or rhonchi  ABDOMEN: Soft, non-tender, non-distended MUSCULOSKELETAL:  No edema; No deformity  SKIN: Warm and dry LOWER EXTREMITIES: no swelling NEUROLOGIC:  Alert and oriented x 3 PSYCHIATRIC:  Normal affect   ASSESSMENT:    1. Coronary artery disease involving native coronary artery of native heart without angina pectoris   2. Dilated cardiomyopathy ([employer])   3. Dyslipidemia   4.  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 Coronary artery disease stable. Dyslipidemia I did review K PN which only her LDL of 66 HDL 52 we will continue present medication ICD present I did review interrogation normal function continue monitoring   Medication Adjustments/Labs and Tests Ordered: Current medicines are reviewed at length with the patient today.  Concerns regarding medicines are outlined above.  No orders of the defined types were placed in this encounter.  Medication changes: No orders of the defined types were placed in this encounter.   Signed, Park Liter, MD, [facility] 11/08/2022 1:28 PM    [place]

## 2022-11-08 NOTE — Addendum Note (Signed)
Addended by: Truddie Hidden on: 11/08/2022 01:40 PM   Modules accepted: Orders

## 2022-11-08 NOTE — Patient Instructions (Signed)
Medication Instructions:  Your physician recommends that you continue on your current medications as directed. Please refer to the Current Medication list given to you today.  *If you need a refill on your cardiac medications before your next appointment, please call your pharmacy*   Lab Work: None ordered If you have labs (blood work) drawn today and your tests are completely normal, you will receive your results only by: [place] (if you have MyChart) OR A paper copy in the mail If you have any lab test that is abnormal or we need to change your treatment, we will call you to review the results.   Testing/Procedures: Your physician has requested that you have an echocardiogram. Echocardiography is a painless test that uses sound waves to create images of your heart. It provides your doctor with information about the size and shape of your heart and how well your heart's chambers and valves are working. This procedure takes approximately one hour. There are no restrictions for this procedure. Please do NOT wear cologne, perfume, aftershave, or lotions (deodorant is allowed). Please arrive 15 minutes prior to your appointment time.     Follow-Up: At [facility], you and your health needs are our priority.  As part of our continuing mission to provide you with exceptional heart care, we have created designated Provider Care Teams.  These Care Teams include your primary Cardiologist (physician) and Advanced Practice Providers (APPs -  Physician Assistants and Nurse Practitioners) who all work together to provide you with the care you need, when you need it.  We recommend signing up for the patient portal called "MyChart".  Sign up information is provided on this After Visit Summary.  MyChart is used to connect with patients for Virtual Visits (Telemedicine).  Patients are able to view lab/test results, encounter notes, upcoming appointments, etc.  Non-urgent messages can be sent to  your provider as well.   To learn more about what you can do with MyChart, go to [email or web address].    Your next appointment:   6 month(s)  The format for your next appointment:   In Person  Provider:   Jenne Campus, MD   Other Instructions Echocardiogram An echocardiogram is a test that uses sound waves (ultrasound) to produce images of the heart. Images from an echocardiogram can provide important information about: Heart size and shape. The size and thickness and movement of your heart's walls. Heart muscle function and strength. Heart valve function or if you have stenosis. Stenosis is when the heart valves are too narrow. If blood is flowing backward through the heart valves (regurgitation). A tumor or infectious growth around the heart valves. Areas of heart muscle that are not working well because of poor blood flow or injury from a heart attack. Aneurysm detection. An aneurysm is a weak or damaged part of an artery wall. The wall bulges out from the normal force of blood pumping through the body. Tell a health care provider about: Any allergies you have. All medicines you are taking, including vitamins, herbs, eye drops, creams, and over-the-counter medicines. Any blood disorders you have. Any surgeries you have had. Any medical conditions you have. Whether you are pregnant or may be pregnant. What are the risks? Generally, this is a safe test. However, problems may occur, including an allergic reaction to dye (contrast) that may be used during the test. What happens before the test? No specific preparation is needed. You may eat and drink normally. What happens during the test? You will  take off your clothes from the waist up and put on a hospital gown. Electrodes or electrocardiogram (ECG)patches may be placed on your chest. The electrodes or patches are then connected to a device that monitors your heart rate and rhythm. You will lie down on a table for an  ultrasound exam. A gel will be applied to your chest to help sound waves pass through your skin. A handheld device, called a transducer, will be pressed against your chest and moved over your heart. The transducer produces sound waves that travel to your heart and bounce back (or "echo" back) to the transducer. These sound waves will be captured in real-time and changed into images of your heart that can be viewed on a video monitor. The images will be recorded on a computer and reviewed by your health care provider. You may be asked to change positions or hold your breath for a short time. This makes it easier to get different views or better views of your heart. In some cases, you may receive contrast through an IV in one of your veins. This can improve the quality of the pictures from your heart. The procedure may vary among health care providers and hospitals.   What can I expect after the test? You may return to your normal, everyday life, including diet, activities, and medicines, unless your health care provider tells you not to do that. Follow these instructions at home: It is up to you to get the results of your test. Ask your health care provider, or the department that is doing the test, when your results will be ready. Keep all follow-up visits. This is important. Summary An echocardiogram is a test that uses sound waves (ultrasound) to produce images of the heart. Images from an echocardiogram can provide important information about the size and shape of your heart, heart muscle function, heart valve function, and other possible heart problems. You do not need to do anything to prepare before this test. You may eat and drink normally. After the echocardiogram is completed, you may return to your normal, everyday life, unless your health care provider tells you not to do that. This information is not intended to replace advice given to you by your health care provider. Make sure you  discuss any questions you have with your health care provider. Document Revised: 04/04/2020 Document Reviewed: 04/04/2020 Elsevier Patient Education  2021 Elsevier Inc.   Important Information About Sugar        

## 2022-11-10 DIAGNOSIS — J449 Chronic obstructive pulmonary disease, unspecified: Secondary | ICD-10-CM | POA: Diagnosis not present

## 2022-11-12 ENCOUNTER — Telehealth: Payer: Self-pay | Admitting: Neurology

## 2022-11-12 NOTE — Telephone Encounter (Signed)
Patient dismissed from [facility] by Dr. Metta Clines, effective 11/05/22. Dismissal letter sent out by 1st class [place] mail 3/19/24fbg

## 2022-11-14 ENCOUNTER — Other Ambulatory Visit: Payer: Self-pay | Admitting: Neurology

## 2022-11-14 ENCOUNTER — Other Ambulatory Visit: Payer: Self-pay | Admitting: Family Medicine

## 2022-11-14 DIAGNOSIS — J9611 Chronic respiratory failure with hypoxia: Secondary | ICD-10-CM

## 2022-11-15 DIAGNOSIS — I509 Heart failure, unspecified: Secondary | ICD-10-CM | POA: Diagnosis not present

## 2022-11-15 DIAGNOSIS — M549 Dorsalgia, unspecified: Secondary | ICD-10-CM | POA: Diagnosis not present

## 2022-11-15 DIAGNOSIS — M5136 Other intervertebral disc degeneration, lumbar region: Secondary | ICD-10-CM | POA: Diagnosis not present

## 2022-11-15 DIAGNOSIS — M5030 Other cervical disc degeneration, unspecified cervical region: Secondary | ICD-10-CM | POA: Diagnosis not present

## 2022-11-15 DIAGNOSIS — G8929 Other chronic pain: Secondary | ICD-10-CM | POA: Diagnosis not present

## 2022-11-15 DIAGNOSIS — Z79899 Other long term (current) drug therapy: Secondary | ICD-10-CM | POA: Diagnosis not present

## 2022-11-15 DIAGNOSIS — M542 Cervicalgia: Secondary | ICD-10-CM | POA: Diagnosis not present

## 2022-11-18 DIAGNOSIS — I5022 Chronic systolic (congestive) heart failure: Secondary | ICD-10-CM | POA: Diagnosis not present

## 2022-11-18 DIAGNOSIS — J9611 Chronic respiratory failure with hypoxia: Secondary | ICD-10-CM | POA: Diagnosis not present

## 2022-11-18 DIAGNOSIS — I2510 Atherosclerotic heart disease of native coronary artery without angina pectoris: Secondary | ICD-10-CM | POA: Diagnosis not present

## 2022-11-19 DIAGNOSIS — M1712 Unilateral primary osteoarthritis, left knee: Secondary | ICD-10-CM | POA: Diagnosis not present

## 2022-11-20 DIAGNOSIS — Z79899 Other long term (current) drug therapy: Secondary | ICD-10-CM | POA: Diagnosis not present

## 2022-12-04 ENCOUNTER — Ambulatory Visit (INDEPENDENT_AMBULATORY_CARE_PROVIDER_SITE_OTHER): Payer: 59

## 2022-12-04 DIAGNOSIS — I420 Dilated cardiomyopathy: Secondary | ICD-10-CM | POA: Diagnosis not present

## 2022-12-04 LAB — CUP PACEART REMOTE DEVICE CHECK
Battery Remaining Longevity: 92 mo
Battery Voltage: 3.01 V
Brady Statistic AP VP Percent: 0.01 %
Brady Statistic AP VS Percent: 4.53 %
Brady Statistic AS VP Percent: 0.03 %
Brady Statistic AS VS Percent: 95.44 %
Brady Statistic RA Percent Paced: 4.49 %
Brady Statistic RV Percent Paced: 0.04 %
Date Time Interrogation Session: 20240410022705
HighPow Impedance: 79 Ohm
Implantable Lead Connection Status: 753985
Implantable Lead Connection Status: 753985
Implantable Lead Implant Date: 20120502
Implantable Lead Implant Date: 20120502
Implantable Lead Location: 753859
Implantable Lead Location: 753860
Implantable Lead Model: 4076
Implantable Lead Model: 7122
Implantable Pulse Generator Implant Date: 20200925
Lead Channel Impedance Value: 361 Ohm
Lead Channel Impedance Value: 551 Ohm
Lead Channel Impedance Value: 665 Ohm
Lead Channel Pacing Threshold Amplitude: 0.5 V
Lead Channel Pacing Threshold Amplitude: 1.125 V
Lead Channel Pacing Threshold Pulse Width: 0.4 ms
Lead Channel Pacing Threshold Pulse Width: 0.4 ms
Lead Channel Sensing Intrinsic Amplitude: 2.25 mV
Lead Channel Sensing Intrinsic Amplitude: 2.25 mV
Lead Channel Sensing Intrinsic Amplitude: 9 mV
Lead Channel Sensing Intrinsic Amplitude: 9 mV
Lead Channel Setting Pacing Amplitude: 1.5 V
Lead Channel Setting Pacing Amplitude: 2.5 V
Lead Channel Setting Pacing Pulse Width: 0.4 ms
Lead Channel Setting Sensing Sensitivity: 0.3 mV
Zone Setting Status: 755011
Zone Setting Status: 755011

## 2022-12-11 DIAGNOSIS — H6591 Unspecified nonsuppurative otitis media, right ear: Secondary | ICD-10-CM | POA: Diagnosis not present

## 2022-12-11 DIAGNOSIS — J449 Chronic obstructive pulmonary disease, unspecified: Secondary | ICD-10-CM | POA: Diagnosis not present

## 2022-12-16 DIAGNOSIS — M549 Dorsalgia, unspecified: Secondary | ICD-10-CM | POA: Diagnosis not present

## 2022-12-16 DIAGNOSIS — Z79899 Other long term (current) drug therapy: Secondary | ICD-10-CM | POA: Diagnosis not present

## 2022-12-16 DIAGNOSIS — I509 Heart failure, unspecified: Secondary | ICD-10-CM | POA: Diagnosis not present

## 2022-12-16 DIAGNOSIS — M5136 Other intervertebral disc degeneration, lumbar region: Secondary | ICD-10-CM | POA: Diagnosis not present

## 2022-12-16 DIAGNOSIS — G8929 Other chronic pain: Secondary | ICD-10-CM | POA: Diagnosis not present

## 2022-12-16 DIAGNOSIS — M542 Cervicalgia: Secondary | ICD-10-CM | POA: Diagnosis not present

## 2022-12-16 DIAGNOSIS — M5030 Other cervical disc degeneration, unspecified cervical region: Secondary | ICD-10-CM | POA: Diagnosis not present

## 2022-12-17 DIAGNOSIS — I2510 Atherosclerotic heart disease of native coronary artery without angina pectoris: Secondary | ICD-10-CM | POA: Diagnosis not present

## 2022-12-17 DIAGNOSIS — I5022 Chronic systolic (congestive) heart failure: Secondary | ICD-10-CM | POA: Diagnosis not present

## 2022-12-17 DIAGNOSIS — J9611 Chronic respiratory failure with hypoxia: Secondary | ICD-10-CM | POA: Diagnosis not present

## 2022-12-18 DIAGNOSIS — Z79899 Other long term (current) drug therapy: Secondary | ICD-10-CM | POA: Diagnosis not present

## 2022-12-24 ENCOUNTER — Ambulatory Visit: Payer: 59 | Admitting: Family Medicine

## 2022-12-24 DIAGNOSIS — N184 Chronic kidney disease, stage 4 (severe): Secondary | ICD-10-CM | POA: Diagnosis not present

## 2022-12-24 DIAGNOSIS — R809 Proteinuria, unspecified: Secondary | ICD-10-CM | POA: Diagnosis not present

## 2022-12-24 DIAGNOSIS — N2581 Secondary hyperparathyroidism of renal origin: Secondary | ICD-10-CM | POA: Diagnosis not present

## 2022-12-24 DIAGNOSIS — N189 Chronic kidney disease, unspecified: Secondary | ICD-10-CM | POA: Diagnosis not present

## 2022-12-24 DIAGNOSIS — I959 Hypotension, unspecified: Secondary | ICD-10-CM | POA: Diagnosis not present

## 2022-12-24 DIAGNOSIS — I5022 Chronic systolic (congestive) heart failure: Secondary | ICD-10-CM | POA: Diagnosis not present

## 2022-12-24 DIAGNOSIS — D631 Anemia in chronic kidney disease: Secondary | ICD-10-CM | POA: Diagnosis not present

## 2022-12-26 LAB — LAB REPORT - SCANNED
Albumin, Urine POC: 20.8
Creatinine, POC: 22.8 mg/dL
EGFR: 24
Microalb Creat Ratio: 91

## 2022-12-31 ENCOUNTER — Encounter: Payer: Self-pay | Admitting: Family Medicine

## 2022-12-31 ENCOUNTER — Other Ambulatory Visit: Payer: Self-pay | Admitting: Family Medicine

## 2022-12-31 ENCOUNTER — Ambulatory Visit (INDEPENDENT_AMBULATORY_CARE_PROVIDER_SITE_OTHER): Payer: 59 | Admitting: Family Medicine

## 2022-12-31 VITALS — BP 108/76 | HR 76 | Temp 97.2°F | Ht 60.0 in | Wt 147.6 lb

## 2022-12-31 DIAGNOSIS — I5022 Chronic systolic (congestive) heart failure: Secondary | ICD-10-CM | POA: Diagnosis not present

## 2022-12-31 DIAGNOSIS — J449 Chronic obstructive pulmonary disease, unspecified: Secondary | ICD-10-CM

## 2022-12-31 DIAGNOSIS — T402X5A Adverse effect of other opioids, initial encounter: Secondary | ICD-10-CM | POA: Diagnosis not present

## 2022-12-31 DIAGNOSIS — F5101 Primary insomnia: Secondary | ICD-10-CM

## 2022-12-31 DIAGNOSIS — K732 Chronic active hepatitis, not elsewhere classified: Secondary | ICD-10-CM

## 2022-12-31 DIAGNOSIS — Z95810 Presence of automatic (implantable) cardiac defibrillator: Secondary | ICD-10-CM | POA: Diagnosis not present

## 2022-12-31 DIAGNOSIS — F331 Major depressive disorder, recurrent, moderate: Secondary | ICD-10-CM

## 2022-12-31 DIAGNOSIS — M810 Age-related osteoporosis without current pathological fracture: Secondary | ICD-10-CM | POA: Diagnosis not present

## 2022-12-31 DIAGNOSIS — M545 Low back pain, unspecified: Secondary | ICD-10-CM | POA: Diagnosis not present

## 2022-12-31 DIAGNOSIS — F4310 Post-traumatic stress disorder, unspecified: Secondary | ICD-10-CM

## 2022-12-31 DIAGNOSIS — K2100 Gastro-esophageal reflux disease with esophagitis, without bleeding: Secondary | ICD-10-CM | POA: Diagnosis not present

## 2022-12-31 DIAGNOSIS — F321 Major depressive disorder, single episode, moderate: Secondary | ICD-10-CM

## 2022-12-31 DIAGNOSIS — G8929 Other chronic pain: Secondary | ICD-10-CM

## 2022-12-31 DIAGNOSIS — F1190 Opioid use, unspecified, uncomplicated: Secondary | ICD-10-CM

## 2022-12-31 DIAGNOSIS — Z1231 Encounter for screening mammogram for malignant neoplasm of breast: Secondary | ICD-10-CM

## 2022-12-31 DIAGNOSIS — Z1159 Encounter for screening for other viral diseases: Secondary | ICD-10-CM | POA: Diagnosis not present

## 2022-12-31 DIAGNOSIS — E782 Mixed hyperlipidemia: Secondary | ICD-10-CM | POA: Diagnosis not present

## 2022-12-31 DIAGNOSIS — G43009 Migraine without aura, not intractable, without status migrainosus: Secondary | ICD-10-CM

## 2022-12-31 DIAGNOSIS — Z23 Encounter for immunization: Secondary | ICD-10-CM | POA: Diagnosis not present

## 2022-12-31 MED ORDER — EMGALITY 120 MG/ML ~~LOC~~ SOSY
240.0000 mg | PREFILLED_SYRINGE | SUBCUTANEOUS | 0 refills | Status: DC
Start: 2022-12-31 — End: 2023-01-06

## 2022-12-31 MED ORDER — DIAZEPAM 5 MG PO TABS
5.0000 mg | ORAL_TABLET | Freq: Every day | ORAL | 0 refills | Status: DC | PRN
Start: 2022-12-31 — End: 2023-01-08

## 2022-12-31 MED ORDER — NALOXEGOL OXALATE 25 MG PO TABS
25.0000 mg | ORAL_TABLET | Freq: Every day | ORAL | 0 refills | Status: DC
Start: 2022-12-31 — End: 2022-12-31

## 2022-12-31 MED ORDER — NALOXEGOL OXALATE 25 MG PO TABS: 25.0000 mg | ORAL_TABLET | Freq: Every day | ORAL | 0 refills | Status: DC

## 2022-12-31 NOTE — Progress Notes (Unsigned)
Subjective:  Patient ID: [name], female    DOB: 08-12-1956  Age: 67 y.o. MRN: [id number]  Chief Complaint  Patient presents with   Medical Management of Chronic Issues    HPI Depression/Anxiety: Diazepam 5 mg THREE TIMES A DAY as needed, sertraline 50 mg daily, Abilify 2 mg daily. Does not see psychiatry.  Migraines; Aimovig 140 mg monthly and topamax 100 mg daily. Has 5-6 migraines a month, resolves quickly with use of ubrelvy. Uses Ubrelvy 100 mg daily and Patient was discharged by Dr. Everlena Cooper.    GERD: Nexium 20 mg daily  COPD: Breo inhaler 1 once daily, duoneb as needed  High Cholesterol/CORONARY ARTERY DISEASE/CHF: taking crestor 20 mg daily, ASA 81. Hx of MI in 2011. Entresto 24-26 mg daily, Ranexa 500 mg TWICE DAILY, Lasix 20 mg TWICE DAILY, Coreg 3.125 TWICE DAILY. Dr Bing Matter.   Osteoporosis: Prolia 60 mg every 6 months  DDD: Belbuca 900 mcg film every 12 hours, Oxycodone 15 mg THREE TIMES A DAY, Tizanidine 4 mg before bed, voltaren gel, gabapentin 800 mg twice A DAY. Needs elevated toilet due to difficulty standing up from standard height toilet.  Constipation: Movantik 12.5 mg daily does not help very much. Dr. Marina Goodell changed from [facility] to linzess. Patient plans to start on colace today.   Allergies taking flonase and xyzal.     12/31/2022    3:37 PM 10/10/2022    4:32 PM 03/20/2022   10:47 AM 06/26/2021    3:50 PM 05/22/2021   10:18 AM  Depression screen PHQ 2/9  Decreased Interest 1 3 0 2 1  Down, Depressed, Hopeless 3 3 1 1  0  PHQ - 2 Score 4 6 1 3 1   Altered sleeping 0 0 1 3 3   Tired, decreased energy 1 3 2  0 3  Change in appetite 1 3 2 3 3   Feeling bad or failure about yourself  0 0 2 1 0  Trouble concentrating 1 0 1 0 0  Moving slowly or fidgety/restless 0 0 0 0 0  Suicidal thoughts 0 0 0 1 0  PHQ-9 Score 7 12 9 11 10   Difficult doing work/chores Not difficult at all Not difficult at all Not difficult at all Somewhat difficult Not difficult at all         12/31/2022    3:37 PM  Fall Risk   Falls in the past year? 0  Number falls in past yr: 0  Injury with Fall? 0  Risk for fall due to : No Fall Risks  Follow up Falls evaluation completed    Patient Care Team: Blane Ohara, MD as PCP - General (Family Medicine) Regan Lemming, MD as PCP - Electrophysiology (Cardiology) Georgeanna Lea, MD as PCP - Cardiology (Cardiology) Tressie Stalker, MD as Consulting Physician (Neurosurgery) Georgeanna Lea, MD as Consulting Physician (Cardiology) Karie Soda, MD as Consulting Physician (General Surgery) Drema Dallas, DO as Consulting Physician (Neurology) Earvin Hansen, [facility] (Inactive) as Pharmacist (Pharmacist) Kerin Salen, MD as Consulting Physician (Gastroenterology) Zehr, Princella Pellegrini, PA-C as Physician Assistant (Gastroenterology) Anthony Sar, MD as Consulting Physician (Nephrology)   Review of Systems  Constitutional:  Negative for chills, fatigue and fever.  HENT:  Negative for congestion, ear pain, rhinorrhea and sore throat.   Respiratory:  Negative for cough and shortness of breath.   Cardiovascular:  Negative for chest pain.  Gastrointestinal:  Negative for abdominal pain, constipation, diarrhea, nausea and vomiting.  Genitourinary:  Negative for dysuria  and urgency.  Musculoskeletal:  Positive for back pain. Negative for myalgias.  Neurological:  Negative for dizziness, weakness, light-headedness and headaches.  Psychiatric/Behavioral:  Negative for dysphoric mood. The patient is not nervous/anxious.     Current Outpatient Medications on File Prior to Visit  Medication Sig Dispense Refill   ARIPiprazole (ABILIFY) 2 MG tablet TAKE ONE TABLET BY MOUTH ONCE DAILY 90 tablet 2   aspirin 81 MG tablet Take 81 mg by mouth daily.     BREO ELLIPTA 100-25 MCG/ACT AEPB INHALE ONE PUFF BY MOUTH INTO LUNGS ONCE DAILY in THE afternoon 60 each 2   Buprenorphine HCl (BELBUCA) 900 MCG FILM Place 900 mcg inside cheek every  12 (twelve) hours.     carvedilol (COREG) 3.125 MG tablet Take 1 tablet (3.125 mg total) by mouth 2 (two) times daily with a meal. 180 tablet 2   denosumab (PROLIA) 60 MG/ML SOSY injection Inject 60 mg into the skin every 6 (six) months. 180 mL 2   diclofenac Sodium (VOLTAREN) 1 % GEL Apply 2 g topically 4 (four) times daily. (Patient taking differently: Apply 2 g topically daily as needed (Pain).) 50 g 4   esomeprazole (NEXIUM) 20 MG capsule TAKE ONE CAPSULE BY MOUTH ONCE DAILY 90 capsule 1   fluticasone (FLONASE) 50 MCG/ACT nasal spray Instill TWO SPRAYS into BOTH nostrils daily AS NEEDED FOR congestion 18 g 3   furosemide (LASIX) 20 MG tablet Take 1 tablet (20 mg total) by mouth 2 (two) times daily. 180 tablet 1   gabapentin (NEURONTIN) 800 MG tablet Take 1 tablet (800 mg total) by mouth 3 (three) times daily. Patient usually takes twice a day (Patient taking differently: Take 800 mg by mouth 3 (three) times daily.) 270 tablet 2   ipratropium-albuterol (DUONEB) 0.5-2.5 (3) MG/3ML SOLN Use 1 vial in nebulizer every 6 (six) hours as needed for shortness of breath 3 mL 11   levocetirizine (XYZAL) 5 MG tablet Take 5 mg by mouth daily as needed for allergies.     Multiple Vitamin (MULTIVITAMIN WITH MINERALS) TABS tablet Take 1 tablet by mouth daily.     ondansetron (ZOFRAN) 4 MG tablet Take 1 tablet (4 mg total) by mouth every 8 (eight) hours as needed for nausea or vomiting. 20 tablet 0   oxyCODONE (ROXICODONE) 15 MG immediate release tablet Take 15 mg by mouth 3 (three) times daily as needed for pain.     ranolazine (RANEXA) 500 MG 12 hr tablet TAKE ONE TABLET BY MOUTH TWICE DAILY 180 tablet 3   rosuvastatin (CRESTOR) 20 MG tablet TAKE ONE TABLET BY MOUTH ONCE DAILY 90 tablet 1   sacubitril-valsartan (ENTRESTO) 24-26 MG Take 1 tablet by mouth 2 (two) times daily. 180 tablet 2   sertraline (ZOLOFT) 50 MG tablet TAKE 1 TABLET BY MOUTH DAILY (Patient taking differently: Take 50 mg by mouth daily.) 90  tablet 2   tiZANidine (ZANAFLEX) 4 MG tablet Take 4 mg by mouth at bedtime.     topiramate (TOPAMAX) 50 MG tablet TAKE 2 TABLETS(100 MG) BY MOUTH AT BEDTIME 180 tablet 0   Ubrogepant (UBRELVY) 100 MG TABS Take 100 mg by mouth as needed (May repeat after 2 hours.  Maximum 2 tablets in 24 hours). TAKE 1 TABLET BY MOUTH BY MOUTH AS NEEDED( MAY REPEAT 1 TABLET AFTER 2 HOURS IF NEEDED, MAXIMUM 2 TABLETS IN 24 HOURS) Strength: 100 mg 16 tablet 5   EPINEPHRINE 0.3 mg/0.3 mL IJ SOAJ injection Inject 0.3 mLs (0.3 mg total)  into the muscle as needed for anaphylaxis. (Patient not taking: Reported on 12/31/2022) 1 each 2   megestrol (MEGACE) 20 MG tablet TAKE ONE TABLET BY MOUTH ONCE DAILY 90 tablet 2   naloxone (NARCAN) nasal spray 4 mg/0.1 mL Place 1 spray into the nose once.     nitroGLYCERIN (NITROSTAT) 0.4 MG SL tablet Place 1 tablet (0.4 mg total) under the tongue every 5 (five) minutes as needed for chest pain. (Patient not taking: Reported on 12/31/2022) 25 tablet 6   predniSONE (DELTASONE) 10 MG tablet 10 mg daily with breakfast.     No current facility-administered medications on file prior to visit.   Past Medical History:  Diagnosis Date   Abdominal pain 10/17/2020   Abnormality of gait due to impairment of balance 11/22/2020   Acute diverticulitis 10/17/2020   Admission for long-term opiate analgesic use 10/24/2019   Arthritis of right hip 05/29/2016   Formatting of this note might be different from the original. Added automatically from request for surgery [id number]   BMI 26.0-26.9,adult 03/29/2020   Bronchial asthma    Cardiomyopathy (HCC)    Overview:  Ejection fraction 45% in 2015 Ejection fraction 30 to 35% in November 2018   Chronic back pain    Chronic constipation 07/31/2021   Chronic hypoxemic respiratory failure (HCC) 10/24/2019   Chronic narcotic use 03/23/2015   Chronic pain of both knees 12/21/2018   Added automatically from request for surgery [id number]  Formatting of this note  might be different from the original. 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   Hypotension 05/17/2020   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   Ileus following gastrointestinal surgery (HCC) 03/26/2015   Major depressive disorder, single episode, moderate (HCC) 10/24/2019   Malnutrition of moderate degree (HCC) 10/20/2020   Mesenteric ischemia (HCC) 10/16/2020   Migraine without aura with status migrainosus 10/24/2019   Mixed incontinence 10/20/2020   Myocardial infarction (HCC)    Nausea & vomiting    Obstructive chronic bronchitis with exacerbation (HCC) 10/25/2019   Other spondylosis with radiculopathy, lumbar region 10/24/2019   Persistent vomiting 05/17/2020   Presence of left artificial hip joint 10/24/2019   PTSD (post-traumatic stress disorder)    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 03/23/2015 04/19/2014   Senile osteoporosis 10/24/2019   Sepsis (HCC) 10/17/2020   Serosanguineous chronic otitis media of right ear 08/02/2021   Small bowel obstruction (HCC)    Thyroid disease    Upper respiratory tract infection due to COVID-19 virus 07/06/2021   Wellness examination 03/27/2021   Past  Surgical History:  Procedure Laterality Date   APPENDECTOMY     CARDIAC CATHETERIZATION     CARPAL TUNNEL RELEASE     CESAREAN SECTION     CHF s/p AICD   12/2010   CHOLECYSTECTOMY  COLONOSCOPY  10/16/2015   Mild colonic diverticulosis, predominantly in the left colon. Small internal hemrrhoids. Otherwise normal colonoscopy   COLONOSCOPY WITH PROPOFOL N/A 09/27/2021   Procedure: COLONOSCOPY WITH PROPOFOL;  Surgeon: Napoleon Form, MD;  Location: [facility];  Service: Endoscopy;  Laterality: N/A;   CORONARY ANGIOPLASTY     ESOPHAGOGASTRODUODENOSCOPY  02/04/2014   Mild gastritis. Otherwise normal EGD   HAND SURGERY Bilateral    ICD GENERATOR CHANGEOUT N/A 05/21/2019   Procedure: ICD GENERATOR CHANGEOUT;  Surgeon: Regan Lemming, MD;  [facility] INVASIVE CV LAB;  Service: Cardiovascular;  Laterality: N/A;   ICD IMPLANT     Medtronic   intestinal blockage 2011     LAPAROSCOPIC ASSISTED VENTRAL HERNIA REPAIR N/A 03/23/2015   Procedure: LAPAROSCOPIC VENTRAL WALL HERNIA REPAIR;  Surgeon: Karie Soda, MD;  Location: [facility] ORS;  Service: General;  Laterality: N/A;  With MESH   LAPAROSCOPIC LYSIS OF ADHESIONS N/A 03/23/2015   Procedure: LAPAROSCOPIC LYSIS OF ADHESIONS;  Surgeon: Karie Soda, MD;  Location: [facility] ORS;  Service: General;  Laterality: N/A;   LSCS      x2   NASAL SEPTUM SURGERY     NECK SURGERY     fused   TONSILLECTOMY     TYMPANOSTOMY TUBE PLACEMENT Right 2024   ULNAR NERVE TRANSPOSITION  01/23/2012   Procedure: ULNAR NERVE DECOMPRESSION/TRANSPOSITION;  Surgeon: Cristi Loron, MD;  Location: [facility] NEURO ORS;  Service: Neurosurgery;  Laterality: Left;  LEFT ulnar nerve decompression    Family History  Problem Relation Age of Onset   Hypertension Mother    Heart attack Mother    Alcohol abuse Father    Hypertension Father    Heart attack Father    Heart attack Other    Cancer Other    Heart failure Other    Anesthesia problems Neg Hx    Hypotension Neg  Hx    Malignant hyperthermia Neg Hx    Pseudochol deficiency Neg Hx    Breast cancer Neg Hx    Social History   Socioeconomic History   Marital status: Single    Spouse name: Not on file   Number of children: 3   Years of education: Not on file   Highest education level: Not on file  Occupational History   Occupation: disabled  Tobacco Use   Smoking status: Former    Packs/day: 0.50    Years: 30.00    Additional pack years: 0.00    Total pack years: 15.00    Types: Cigarettes    Quit date: 03/2020    Years since quitting: 2.7   Smokeless tobacco: Never  Vaping Use   Vaping Use: Never used  Substance and Sexual Activity   Alcohol use: Yes    Alcohol/week: 2.0 standard drinks of alcohol    Types: 1 Cans of beer, 1 Standard drinks or equivalent per week    Comment: seldom   Drug use: No   Sexual activity: Not Currently  Other Topics Concern   Not on file  Social History Narrative   One level home with boyfriend   Caffeine - coffee 1-2 cups/day; Green tea 4-5 bottles a day   Exercise - some    Right handed      Social Determinants of Health   Financial Resource Strain: Low Risk  (12/31/2022)   Overall Financial Resource Strain (CARDIA)    Difficulty of Paying Living Expenses: Not hard at all  Food Insecurity: No Food Insecurity (12/31/2022)  Hunger Vital Sign    Worried About Running Out of Food in the Last Year: Never true    Ran Out of Food in the Last Year: Never true  Transportation Needs: No Transportation Needs (12/31/2022)   PRAPARE - Administrator, Civil Service (Medical): No    Lack of Transportation (Non-Medical): No  Physical Activity: Insufficiently Active (12/31/2022)   Exercise Vital Sign    Days of Exercise per Week: 2 days    Minutes of Exercise per Session: 20 min  Stress: No Stress Concern Present (12/31/2022)   [employer] of Occupational Health - Occupational Stress Questionnaire    Feeling of Stress : Only a little  Social  Connections: Moderately Isolated (12/31/2022)   Social Connection and Isolation Panel [NHANES]    Frequency of Communication with Friends and Family: More than three times a week    Frequency of Social Gatherings with Friends and Family: Three times a week    Attends Religious Services: 1 to 4 times per year    Active Member of Clubs or Organizations: No    Attends Banker Meetings: Never    Marital Status: Divorced    Objective:  BP 108/76   Pulse 76   Temp (!) 97.2 F (36.2 C)   Ht 5' (1.524 m)   Wt 147 lb 9.6 oz (67 kg)   SpO2 94%   BMI 28.83 kg/m      12/31/2022    3:34 PM 11/08/2022    1:21 PM 10/14/2022   12:08 PM  BP/Weight  Systolic BP 108 110 92  Diastolic BP 76 74 84  Wt. (Lbs) 147.6 150.2 146.8  BMI 28.83 kg/m2 29.33 kg/m2 31.77 kg/m2    Physical Exam Vitals reviewed.  Constitutional:      Appearance: Normal appearance. She is normal weight.  Neck:     Vascular: No carotid bruit.  Cardiovascular:     Rate and Rhythm: Normal rate and regular rhythm.     Heart sounds: Normal heart sounds.  Pulmonary:     Effort: Pulmonary effort is normal. No respiratory distress.     Breath sounds: Normal breath sounds.  Abdominal:     General: Abdomen is flat. Bowel sounds are normal.     Palpations: Abdomen is soft.     Tenderness: There is no abdominal tenderness.  Neurological:     Mental Status: She is alert and oriented to person, place, and time.  Psychiatric:        Mood and Affect: Mood normal.        Behavior: Behavior normal.     Diabetic Foot Exam - Simple   No data filed      Lab Results  Component Value Date   WBC 7.4 12/31/2022   HGB 10.3 (L) 12/31/2022   HCT 31.4 (L) 12/31/2022   PLT 272 12/31/2022   GLUCOSE 112 (H) 12/31/2022   CHOL 163 12/31/2022   TRIG 146 12/31/2022   HDL 46 12/31/2022   LDLCALC 91 12/31/2022   ALT 5 12/31/2022   AST 15 12/31/2022   NA 140 12/31/2022   K 4.3 12/31/2022   CL 105 12/31/2022   CREATININE  2.61 (H) 12/31/2022   BUN 44 (H) 12/31/2022   CO2 18 (L) 12/31/2022   TSH 0.816 02/15/2021   INR 1.0 10/18/2020      Assessment & Plan:    Gastroesophageal reflux disease with esophagitis, unspecified whether hemorrhage Assessment & Plan: Well controlled.  No changes to  medicines.  Continue to work on eating a healthy diet and exercise.  Labs drawn today.     Chronic systolic congestive heart failure, NYHA class 2 (HCC)  Mixed hyperlipidemia -     CBC with Differential/Platelet -     Comprehensive metabolic panel -     Lipid panel  Chronic obstructive pulmonary disease, unspecified COPD type (HCC) Assessment & Plan: The current medical regimen is effective;  continue present plan and medications.    Major depressive disorder, single episode, moderate (HCC) Assessment & Plan: Well controlled.  No medication changes recommended.    Therapeutic opioid induced constipation Assessment & Plan: Stop linzess and start movantik 25 mg once daily.     Chronic active hepatitis (HCC)  Encounter for hepatitis C screening test for low risk patient -     HCV Ab w Reflex to Quant PCR  Migraine without aura and without status migrainosus, not intractable Assessment & Plan: Stop aimovig. Start emgallity 120 mg 2 shots the first time, then 1 shot monthly. Call back 3 weeks after starting dose to let us know how you are doing and we will send next dose of emgallity.    Orders: -     Emgality; Inject 240 mg into the skin every 30 (thirty) days.  Dispense: 2 mL; Refill: 0  Primary insomnia -     diazePAM; Take 1 tablet (5 mg total) by mouth daily as needed for anxiety. for anxiety  Dispense: 30 tablet; Refill: 0  Need for vaccination -     Pneumococcal conjugate vaccine 20-valent  PTSD (post-traumatic stress disorder) Assessment & Plan: Decrease valium to 5 mg once daily as needed.    Other orders -     Naloxegol Oxalate; Take 1 tablet (25 mg total) by mouth daily.   Dispense: 90 tablet; Refill: 0 -     Interpretation: -     Cardiovascular Risk Assessment     Meds ordered this encounter  Medications   DISCONTD: naloxegol oxalate (MOVANTIK) 25 MG TABS tablet    Sig: Take 1 tablet (25 mg total) by mouth daily.    Dispense:  12 tablet    Refill:  0   Galcanezumab-gnlm (EMGALITY) 120 MG/ML SOSY    Sig: Inject 240 mg into the skin every 30 (thirty) days.    Dispense:  2 mL    Refill:  0   naloxegol oxalate (MOVANTIK) 25 MG TABS tablet    Sig: Take 1 tablet (25 mg total) by mouth daily.    Dispense:  90 tablet    Refill:  0   diazepam (VALIUM) 5 MG tablet    Sig: Take 1 tablet (5 mg total) by mouth daily as needed for anxiety. for anxiety    Dispense:  30 tablet    Refill:  0    Cancel all previous rxs for valium. Dr. Sedalia Muta    Orders Placed This Encounter  Procedures   Pneumococcal conjugate vaccine 20-valent (Prevnar 20)   CBC with Differential/Platelet   Comprehensive metabolic panel   Lipid panel   HCV Ab w Reflex to Quant PCR   Interpretation:   Cardiovascular Risk Assessment     Follow-up: Return in about 3 months (around 04/02/2023) for chronic fasting.  Total time spent on today's visit was greater than 40 minutes, including both face-to-face time and nonface-to-face time personally spent on review of chart (labs and imaging), discussing labs and goals, discussing further work-up, treatment options, referrals to specialist if needed, reviewing outside  records of pertinent, answering patient's questions, and coordinating care.   I,Katherina A Bramblett,acting as a scribe for Blane Ohara, MD.,have documented all relevant documentation on the behalf of Blane Ohara, MD,as directed by  Blane Ohara, MD while in the presence of Blane Ohara, MD.   Clayborn Bigness I Leal-Borjas,acting as a scribe for Blane Ohara, MD.,have documented all relevant documentation on the behalf of Blane Ohara, MD,as directed by  Blane Ohara, MD while in the presence of  Blane Ohara, MD.    An After Visit Summary was printed and given to the patient.  Blane Ohara, MD [facility] [phone]

## 2022-12-31 NOTE — Patient Instructions (Addendum)
Migraines: Stop aimovig. Start emgallity 120 mg 2 shots the first time, then 1 shot monthly. Call back 3 weeks after starting dose to let us know how you are doing and we will send next dose of emgallity.   Constipation: Stop linzess and start movantik 25 mg once daily.   Anxiety: Decrease valium to 5 mg once daily as needed.   Immunizations recommended to get at the pharmacy.  Shingrix series, Tetanus (TDAP), and RSV vaccines.

## 2022-12-31 NOTE — Assessment & Plan Note (Addendum)
The current medical regimen is effective;  continue present plan and medications. Continue nexium 20 mg daily.  

## 2022-12-31 NOTE — Assessment & Plan Note (Addendum)
Well controlled.  Continue sertraline 50 mg daily, Abilify 2 mg daily.  Decrease valium to 5 mg once daily as needed.

## 2023-01-01 ENCOUNTER — Ambulatory Visit
Admission: RE | Admit: 2023-01-01 | Discharge: 2023-01-01 | Disposition: A | Discharge status: Home / Self Care | Payer: 59 | Source: Ambulatory Visit | Attending: Family Medicine | Admitting: Family Medicine

## 2023-01-01 DIAGNOSIS — F5101 Primary insomnia: Secondary | ICD-10-CM | POA: Diagnosis not present

## 2023-01-01 DIAGNOSIS — J449 Chronic obstructive pulmonary disease, unspecified: Secondary | ICD-10-CM | POA: Diagnosis not present

## 2023-01-01 DIAGNOSIS — Z23 Encounter for immunization: Secondary | ICD-10-CM

## 2023-01-01 DIAGNOSIS — M545 Low back pain, unspecified: Secondary | ICD-10-CM | POA: Diagnosis not present

## 2023-01-01 DIAGNOSIS — Z1231 Encounter for screening mammogram for malignant neoplasm of breast: Secondary | ICD-10-CM | POA: Diagnosis not present

## 2023-01-01 DIAGNOSIS — Z95810 Presence of automatic (implantable) cardiac defibrillator: Secondary | ICD-10-CM | POA: Diagnosis not present

## 2023-01-01 DIAGNOSIS — Z1159 Encounter for screening for other viral diseases: Secondary | ICD-10-CM | POA: Diagnosis not present

## 2023-01-01 DIAGNOSIS — M810 Age-related osteoporosis without current pathological fracture: Secondary | ICD-10-CM | POA: Diagnosis not present

## 2023-01-01 DIAGNOSIS — T402X5A Adverse effect of other opioids, initial encounter: Secondary | ICD-10-CM | POA: Diagnosis not present

## 2023-01-01 LAB — CBC WITH DIFFERENTIAL/PLATELET
Basophils Absolute: 0.1 10*3/uL (ref 0.0–0.2)
Basos: 1 %
EOS (ABSOLUTE): 0.2 10*3/uL (ref 0.0–0.4)
Eos: 3 %
Hematocrit: 31.4 % — ABNORMAL LOW (ref 34.0–46.6)
Hemoglobin: 10.3 g/dL — ABNORMAL LOW (ref 11.1–15.9)
Immature Grans (Abs): 0 10*3/uL (ref 0.0–0.1)
Immature Granulocytes: 0 %
Lymphocytes Absolute: 2.2 10*3/uL (ref 0.7–3.1)
Lymphs: 29 %
MCH: 29.9 pg (ref 26.6–33.0)
MCHC: 32.8 g/dL (ref 31.5–35.7)
MCV: 91 fL (ref 79–97)
Monocytes Absolute: 0.5 10*3/uL (ref 0.1–0.9)
Monocytes: 7 %
Neutrophils Absolute: 4.4 10*3/uL (ref 1.4–7.0)
Neutrophils: 60 %
Platelets: 272 10*3/uL (ref 150–450)
RBC: 3.45 x10E6/uL — ABNORMAL LOW (ref 3.77–5.28)
RDW: 13 % (ref 11.7–15.4)
WBC: 7.4 10*3/uL (ref 3.4–10.8)

## 2023-01-01 LAB — LIPID PANEL
Chol/HDL Ratio: 3.5 ratio (ref 0.0–4.4)
Cholesterol, Total: 163 mg/dL (ref 100–199)
HDL: 46 mg/dL (ref >39)
LDL Chol Calc (NIH): 91 mg/dL (ref 0–99)
Triglycerides: 146 mg/dL (ref 0–149)
VLDL Cholesterol Cal: 26 mg/dL (ref 5–40)

## 2023-01-01 LAB — HCV INTERPRETATION

## 2023-01-01 LAB — HCV AB W REFLEX TO QUANT PCR: HCV Ab: NONREACTIVE

## 2023-01-01 LAB — COMPREHENSIVE METABOLIC PANEL
ALT: 5 IU/L (ref 0–32)
AST: 15 IU/L (ref 0–40)
Albumin/Globulin Ratio: 1.9 (ref 1.2–2.2)
Albumin: 4.4 g/dL (ref 3.9–4.9)
Alkaline Phosphatase: 57 IU/L (ref 44–121)
BUN/Creatinine Ratio: 17 (ref 12–28)
BUN: 44 mg/dL — ABNORMAL HIGH (ref 8–27)
Bilirubin Total: 0.2 mg/dL (ref 0.0–1.2)
CO2: 18 mmol/L — ABNORMAL LOW (ref 20–29)
Calcium: 9.5 mg/dL (ref 8.7–10.3)
Chloride: 105 mmol/L (ref 96–106)
Creatinine, Ser: 2.61 mg/dL — ABNORMAL HIGH (ref 0.57–1.00)
Globulin, Total: 2.3 g/dL (ref 1.5–4.5)
Glucose: 112 mg/dL — ABNORMAL HIGH (ref 70–99)
Potassium: 4.3 mmol/L (ref 3.5–5.2)
Sodium: 140 mmol/L (ref 134–144)
Total Protein: 6.7 g/dL (ref 6.0–8.5)
eGFR: 19 mL/min/{1.73_m2} — ABNORMAL LOW (ref >59)

## 2023-01-01 LAB — CARDIOVASCULAR RISK ASSESSMENT

## 2023-01-04 DIAGNOSIS — J441 Chronic obstructive pulmonary disease with (acute) exacerbation: Secondary | ICD-10-CM

## 2023-01-04 DIAGNOSIS — J449 Chronic obstructive pulmonary disease, unspecified: Secondary | ICD-10-CM | POA: Insufficient documentation

## 2023-01-04 DIAGNOSIS — G43009 Migraine without aura, not intractable, without status migrainosus: Secondary | ICD-10-CM

## 2023-01-04 DIAGNOSIS — F5101 Primary insomnia: Secondary | ICD-10-CM

## 2023-01-04 DIAGNOSIS — T402X5A Adverse effect of other opioids, initial encounter: Secondary | ICD-10-CM

## 2023-01-04 DIAGNOSIS — K732 Chronic active hepatitis, not elsewhere classified: Secondary | ICD-10-CM | POA: Insufficient documentation

## 2023-01-04 HISTORY — DX: Chronic active hepatitis, not elsewhere classified: K73.2

## 2023-01-04 HISTORY — DX: Migraine without aura, not intractable, without status migrainosus: G43.009

## 2023-01-04 HISTORY — DX: Adverse effect of other opioids, initial encounter: T40.2X5A

## 2023-01-04 HISTORY — DX: Chronic obstructive pulmonary disease with (acute) exacerbation: J44.1

## 2023-01-04 HISTORY — DX: Primary insomnia: F51.01

## 2023-01-04 NOTE — Assessment & Plan Note (Signed)
Stop aimovig. Start emgallity 120 mg 2 shots the first time, then 1 shot monthly. Call back 3 weeks after starting dose to let us know how you are doing and we will send next dose of emgallity.

## 2023-01-04 NOTE — Assessment & Plan Note (Signed)
The current medical regimen is effective;  continue present plan and medications. Continue breo and duoneb.

## 2023-01-04 NOTE — Assessment & Plan Note (Signed)
Decrease valium to 5 mg once daily as needed.

## 2023-01-04 NOTE — Assessment & Plan Note (Signed)
Stop linzess and start movantik 25 mg once daily.

## 2023-01-05 ENCOUNTER — Other Ambulatory Visit: Payer: Self-pay | Admitting: Family Medicine

## 2023-01-05 DIAGNOSIS — G43009 Migraine without aura, not intractable, without status migrainosus: Secondary | ICD-10-CM

## 2023-01-05 NOTE — Assessment & Plan Note (Signed)
Well controlled.  No changes to medicines. Continue 20 mg before bed.  Continue to work on eating a healthy diet and exercise.  Labs drawn today.

## 2023-01-05 NOTE — Assessment & Plan Note (Signed)
Management per specialist.  Belbuca 900 mcg film every 12 hours, Oxycodone 15 mg THREE TIMES A DAY, Tizanidine 4 mg before bed, voltaren gel, gabapentin 800 mg twice A DAY. Decrease valium 5 mg daily as needed.

## 2023-01-05 NOTE — Assessment & Plan Note (Signed)
Continue prolia 60 mg once every 6 months.

## 2023-01-05 NOTE — Assessment & Plan Note (Signed)
Managed by pain clinic 

## 2023-01-05 NOTE — Assessment & Plan Note (Signed)
Stable.  Management per specialist. Dr Bing Matter.  Continue Entresto 24-26 mg daily, Ranexa 500 mg TWICE DAILY, Lasix 20 mg TWICE DAILY, Coreg 3.125 TWICE DAILY.

## 2023-01-08 ENCOUNTER — Other Ambulatory Visit: Payer: Self-pay | Admitting: Family Medicine

## 2023-01-08 DIAGNOSIS — F5101 Primary insomnia: Secondary | ICD-10-CM

## 2023-01-08 DIAGNOSIS — I5022 Chronic systolic (congestive) heart failure: Secondary | ICD-10-CM

## 2023-01-08 MED ORDER — ENTRESTO 24-26 MG PO TABS: 1.0000 | ORAL_TABLET | Freq: Two times a day (BID) | ORAL | 1 refills | Status: DC

## 2023-01-08 MED ORDER — CARVEDILOL 3.125 MG PO TABS: 3.1250 mg | ORAL_TABLET | Freq: Two times a day (BID) | ORAL | 1 refills | Status: DC

## 2023-01-10 DIAGNOSIS — J449 Chronic obstructive pulmonary disease, unspecified: Secondary | ICD-10-CM | POA: Diagnosis not present

## 2023-01-10 NOTE — Progress Notes (Signed)
Remote ICD transmission.   

## 2023-01-14 ENCOUNTER — Other Ambulatory Visit: Payer: Self-pay

## 2023-01-14 DIAGNOSIS — F321 Major depressive disorder, single episode, moderate: Secondary | ICD-10-CM

## 2023-01-14 MED ORDER — ARIPIPRAZOLE 2 MG PO TABS
2.0000 mg | ORAL_TABLET | Freq: Every day | ORAL | 1 refills | Status: DC
Start: 2023-01-14 — End: 2023-06-02

## 2023-01-15 ENCOUNTER — Other Ambulatory Visit: Payer: Self-pay | Admitting: Family Medicine

## 2023-01-16 DIAGNOSIS — M4802 Spinal stenosis, cervical region: Secondary | ICD-10-CM | POA: Diagnosis not present

## 2023-01-16 DIAGNOSIS — M549 Dorsalgia, unspecified: Secondary | ICD-10-CM | POA: Diagnosis not present

## 2023-01-16 DIAGNOSIS — G8929 Other chronic pain: Secondary | ICD-10-CM | POA: Diagnosis not present

## 2023-01-16 DIAGNOSIS — I509 Heart failure, unspecified: Secondary | ICD-10-CM | POA: Diagnosis not present

## 2023-01-16 DIAGNOSIS — M542 Cervicalgia: Secondary | ICD-10-CM | POA: Diagnosis not present

## 2023-01-16 DIAGNOSIS — M5136 Other intervertebral disc degeneration, lumbar region: Secondary | ICD-10-CM | POA: Diagnosis not present

## 2023-01-16 DIAGNOSIS — Z79899 Other long term (current) drug therapy: Secondary | ICD-10-CM | POA: Diagnosis not present

## 2023-01-21 DIAGNOSIS — Z79899 Other long term (current) drug therapy: Secondary | ICD-10-CM | POA: Diagnosis not present

## 2023-02-05 DIAGNOSIS — J441 Chronic obstructive pulmonary disease with (acute) exacerbation: Secondary | ICD-10-CM | POA: Diagnosis not present

## 2023-02-06 ENCOUNTER — Other Ambulatory Visit: Payer: Self-pay | Admitting: Family Medicine

## 2023-02-06 DIAGNOSIS — J9611 Chronic respiratory failure with hypoxia: Secondary | ICD-10-CM

## 2023-02-10 DIAGNOSIS — J449 Chronic obstructive pulmonary disease, unspecified: Secondary | ICD-10-CM | POA: Diagnosis not present

## 2023-02-12 DIAGNOSIS — M5136 Other intervertebral disc degeneration, lumbar region: Secondary | ICD-10-CM | POA: Diagnosis not present

## 2023-02-12 DIAGNOSIS — I509 Heart failure, unspecified: Secondary | ICD-10-CM | POA: Diagnosis not present

## 2023-02-12 DIAGNOSIS — N184 Chronic kidney disease, stage 4 (severe): Secondary | ICD-10-CM | POA: Diagnosis not present

## 2023-02-12 DIAGNOSIS — G8929 Other chronic pain: Secondary | ICD-10-CM | POA: Diagnosis not present

## 2023-02-12 DIAGNOSIS — M542 Cervicalgia: Secondary | ICD-10-CM | POA: Diagnosis not present

## 2023-02-12 DIAGNOSIS — M549 Dorsalgia, unspecified: Secondary | ICD-10-CM | POA: Diagnosis not present

## 2023-02-12 DIAGNOSIS — M5030 Other cervical disc degeneration, unspecified cervical region: Secondary | ICD-10-CM | POA: Diagnosis not present

## 2023-02-13 ENCOUNTER — Telehealth: Payer: Self-pay

## 2023-02-13 NOTE — Telephone Encounter (Signed)
Patient's case manager from [employer] [employer] called and stated that she was calling on behalf of the patient stating that the patient was concerned that since the patient was seen she has not heard or received her order for the raised toilet seat that she was requesting. She is asking if the order has been sent some where or if it has been written and then she requested if we could call the patient back at [phone].

## 2023-02-14 NOTE — Telephone Encounter (Signed)
Done! Written out to be faxed to [facility], and Patient advocate informed and she stated that she would call and inform the patient.

## 2023-02-21 DIAGNOSIS — M1712 Unilateral primary osteoarthritis, left knee: Secondary | ICD-10-CM | POA: Diagnosis not present

## 2023-02-28 DIAGNOSIS — N184 Chronic kidney disease, stage 4 (severe): Secondary | ICD-10-CM | POA: Diagnosis not present

## 2023-03-04 DIAGNOSIS — M25512 Pain in left shoulder: Secondary | ICD-10-CM | POA: Diagnosis not present

## 2023-03-05 ENCOUNTER — Ambulatory Visit (INDEPENDENT_AMBULATORY_CARE_PROVIDER_SITE_OTHER): Payer: 59

## 2023-03-05 DIAGNOSIS — I420 Dilated cardiomyopathy: Secondary | ICD-10-CM

## 2023-03-06 LAB — CUP PACEART REMOTE DEVICE CHECK
Battery Remaining Longevity: 89 mo
Battery Voltage: 3 V
Brady Statistic AP VP Percent: 0 %
Brady Statistic AP VS Percent: 1.86 %
Brady Statistic AS VP Percent: 0.03 %
Brady Statistic AS VS Percent: 98.11 %
Brady Statistic RA Percent Paced: 1.85 %
Brady Statistic RV Percent Paced: 0.03 %
Date Time Interrogation Session: 20240711012305
HighPow Impedance: 76 Ohm
Implantable Lead Connection Status: 753985
Implantable Lead Connection Status: 753985
Implantable Lead Implant Date: 20120502
Implantable Lead Implant Date: 20120502
Implantable Lead Location: 753859
Implantable Lead Location: 753860
Implantable Lead Model: 4076
Implantable Lead Model: 7122
Implantable Pulse Generator Implant Date: 20200925
Lead Channel Impedance Value: 361 Ohm
Lead Channel Impedance Value: 646 Ohm
Lead Channel Impedance Value: 760 Ohm
Lead Channel Pacing Threshold Amplitude: 0.5 V
Lead Channel Pacing Threshold Amplitude: 1 V
Lead Channel Pacing Threshold Pulse Width: 0.4 ms
Lead Channel Pacing Threshold Pulse Width: 0.4 ms
Lead Channel Sensing Intrinsic Amplitude: 10.625 mV
Lead Channel Sensing Intrinsic Amplitude: 10.625 mV
Lead Channel Sensing Intrinsic Amplitude: 2.5 mV
Lead Channel Sensing Intrinsic Amplitude: 2.5 mV
Lead Channel Setting Pacing Amplitude: 1.5 V
Lead Channel Setting Pacing Amplitude: 2.25 V
Lead Channel Setting Pacing Pulse Width: 0.4 ms
Lead Channel Setting Sensing Sensitivity: 0.3 mV
Zone Setting Status: 755011
Zone Setting Status: 755011

## 2023-03-09 ENCOUNTER — Other Ambulatory Visit: Payer: Self-pay | Admitting: Family Medicine

## 2023-03-09 DIAGNOSIS — F5101 Primary insomnia: Secondary | ICD-10-CM

## 2023-03-12 DIAGNOSIS — J449 Chronic obstructive pulmonary disease, unspecified: Secondary | ICD-10-CM | POA: Diagnosis not present

## 2023-03-20 DIAGNOSIS — E559 Vitamin D deficiency, unspecified: Secondary | ICD-10-CM | POA: Diagnosis not present

## 2023-03-20 DIAGNOSIS — M542 Cervicalgia: Secondary | ICD-10-CM | POA: Diagnosis not present

## 2023-03-20 DIAGNOSIS — M549 Dorsalgia, unspecified: Secondary | ICD-10-CM | POA: Diagnosis not present

## 2023-03-20 DIAGNOSIS — M5136 Other intervertebral disc degeneration, lumbar region: Secondary | ICD-10-CM | POA: Diagnosis not present

## 2023-03-20 DIAGNOSIS — R300 Dysuria: Secondary | ICD-10-CM | POA: Diagnosis not present

## 2023-03-20 DIAGNOSIS — N184 Chronic kidney disease, stage 4 (severe): Secondary | ICD-10-CM | POA: Diagnosis not present

## 2023-03-20 DIAGNOSIS — M5030 Other cervical disc degeneration, unspecified cervical region: Secondary | ICD-10-CM | POA: Diagnosis not present

## 2023-03-20 DIAGNOSIS — E7800 Pure hypercholesterolemia, unspecified: Secondary | ICD-10-CM | POA: Diagnosis not present

## 2023-03-20 DIAGNOSIS — I509 Heart failure, unspecified: Secondary | ICD-10-CM | POA: Diagnosis not present

## 2023-03-20 DIAGNOSIS — G8929 Other chronic pain: Secondary | ICD-10-CM | POA: Diagnosis not present

## 2023-03-26 NOTE — Progress Notes (Signed)
Remote ICD transmission.   

## 2023-04-07 ENCOUNTER — Encounter: Payer: Self-pay | Admitting: Family Medicine

## 2023-04-07 ENCOUNTER — Ambulatory Visit (INDEPENDENT_AMBULATORY_CARE_PROVIDER_SITE_OTHER): Payer: 59 | Admitting: Family Medicine

## 2023-04-07 VITALS — BP 118/70 | HR 92 | Temp 97.1°F | Ht 60.0 in | Wt 152.0 lb

## 2023-04-07 DIAGNOSIS — M25542 Pain in joints of left hand: Secondary | ICD-10-CM | POA: Diagnosis not present

## 2023-04-07 DIAGNOSIS — T402X5A Adverse effect of other opioids, initial encounter: Secondary | ICD-10-CM

## 2023-04-07 DIAGNOSIS — M069 Rheumatoid arthritis, unspecified: Secondary | ICD-10-CM

## 2023-04-07 DIAGNOSIS — K5903 Drug induced constipation: Secondary | ICD-10-CM

## 2023-04-07 DIAGNOSIS — K2100 Gastro-esophageal reflux disease with esophagitis, without bleeding: Secondary | ICD-10-CM

## 2023-04-07 DIAGNOSIS — E782 Mixed hyperlipidemia: Secondary | ICD-10-CM

## 2023-04-07 DIAGNOSIS — J449 Chronic obstructive pulmonary disease, unspecified: Secondary | ICD-10-CM | POA: Diagnosis not present

## 2023-04-07 DIAGNOSIS — I5022 Chronic systolic (congestive) heart failure: Secondary | ICD-10-CM

## 2023-04-07 DIAGNOSIS — F331 Major depressive disorder, recurrent, moderate: Secondary | ICD-10-CM

## 2023-04-07 DIAGNOSIS — M25541 Pain in joints of right hand: Secondary | ICD-10-CM

## 2023-04-07 DIAGNOSIS — J9611 Chronic respiratory failure with hypoxia: Secondary | ICD-10-CM | POA: Diagnosis not present

## 2023-04-07 DIAGNOSIS — G43009 Migraine without aura, not intractable, without status migrainosus: Secondary | ICD-10-CM

## 2023-04-07 HISTORY — DX: Pain in joints of right hand: M25.541

## 2023-04-07 HISTORY — DX: Rheumatoid arthritis, unspecified: M06.9

## 2023-04-07 MED ORDER — SERTRALINE HCL 100 MG PO TABS
50.0000 mg | ORAL_TABLET | Freq: Every day | ORAL | 2 refills | Status: DC
Start: 2023-04-07 — End: 2023-08-24

## 2023-04-07 MED ORDER — LUBIPROSTONE 24 MCG PO CAPS: 24.0000 ug | ORAL_CAPSULE | Freq: Two times a day (BID) | ORAL | 2 refills | Status: DC

## 2023-04-07 MED ORDER — FUROSEMIDE 20 MG PO TABS
20.0000 mg | ORAL_TABLET | Freq: Every day | ORAL | 0 refills | Status: DC
Start: 2023-04-07 — End: 2023-06-02

## 2023-04-07 MED ORDER — UBRELVY 100 MG PO TABS
100.0000 mg | ORAL_TABLET | ORAL | 1 refills | Status: DC | PRN
Start: 2023-04-07 — End: 2024-07-23

## 2023-04-07 MED ORDER — DIAZEPAM 2 MG PO TABS
2.0000 mg | ORAL_TABLET | Freq: Four times a day (QID) | ORAL | 0 refills | Status: DC | PRN
Start: 2023-04-07 — End: 2023-05-01

## 2023-04-07 NOTE — Assessment & Plan Note (Signed)
The current medical regimen is effective;  continue present plan and medications. Continue breo and duoneb.

## 2023-04-07 NOTE — Assessment & Plan Note (Signed)
Continue Amitiza 24 mcg twice daily

## 2023-04-07 NOTE — Assessment & Plan Note (Signed)
Ubrelvy given. Continue Emgality monthly and Topamax 100 mg daily.

## 2023-04-07 NOTE — Patient Instructions (Addendum)
Increase sertraline to 100 mg daily. Continue abilify 2 mg daily.  Recommend decrease diazepam to 2 mg once daily after 5 mg gone. Then after one month of diazepam 2 mg once daily, stop it.

## 2023-04-07 NOTE — Assessment & Plan Note (Signed)
The current medical regimen is effective;  continue present plan and medications. Continue nexium 20 mg daily.

## 2023-04-07 NOTE — Progress Notes (Signed)
Subjective:  Patient ID: [name], female    DOB: [birth year]  Age: 67 y.o. MRN: [id number]  Chief Complaint  Patient presents with   Medical Management of Chronic Issues    HPI Depression/Anxiety: Diazepam 5 mg  once daily as needed, sertraline 50 mg daily, Abilify 2 mg daily. Does not see psychiatry. Wants to d/c diazepam due to its interaction with Oxycodone.   Migraines: Emgality 120 mg mg monthly and topamax 100 mg daily. Has 5-6 migraines a month, resolves quickly with use of ubrelvy. Uses Ubrelvy 100 mg daily and Patient was discharged by Dr. Everlena Cooper.  Patient is currently having a migraine.  She has been out of [place].   GERD: Nexium 20 mg daily   COPD: Breo inhaler 1 once daily, duoneb as needed   High Cholesterol/CORONARY ARTERY DISEASE/CHF: taking crestor 20 mg daily, ASA 81. Hx of MI in 2011. Entresto 24-26 mg daily, Ranexa 500 mg TWICE DAILY, Lasix 20 mg TWICE DAILY, Coreg 3.125 TWICE DAILY.  Management per Dr Bing Matter.    Osteoporosis: Prolia 60 mg every 6 months   DDD: Belbuca 900 mcg film every 12 hours, Oxycodone 15 mg THREE TIMES A DAY, Tizanidine 4 mg before bed, voltaren gel, gabapentin 800 mg twice A DAY.  Goes to pain clinic in [facility].   Constipation: Movantik 12.5 mg daily does not help very much. Dr. Marina Goodell changed from [facility] to linzess. Taking colace daily. Linzess did not help. Has not tried [place] or trulance.  Allergies taking flonase and xyzal.     04/07/2023    1:58 PM 12/31/2022    3:37 PM 10/10/2022    4:32 PM 03/20/2022   10:47 AM 06/26/2021    3:50 PM  Depression screen PHQ 2/9  Decreased Interest 0 1 3 0 2  Down, Depressed, Hopeless 1 3 3 1 1   PHQ - 2 Score 1 4 6 1 3   Altered sleeping 3 0 0 1 3  Tired, decreased energy 0 1 3 2  0  Change in appetite 3 1 3 2 3   Feeling bad or failure about yourself  2 0 0 2 1  Trouble concentrating 0 1 0 1 0  Moving slowly or fidgety/restless 1 0 0 0 0  Suicidal thoughts 0 0 0 0 1  PHQ-9 Score 10  7 12 9 11   Difficult doing work/chores Very difficult Not difficult at all Not difficult at all Not difficult at all Somewhat difficult        04/07/2023    1:58 PM  Fall Risk   Falls in the past year? 0  Number falls in past yr: 0  Injury with Fall? 0  Risk for fall due to : No Fall Risks  Follow up Falls evaluation completed      04/07/2023    2:01 PM 12/31/2022    3:38 PM 12/22/2019    7:58 AM  GAD 7 : Generalized Anxiety Score  Nervous, Anxious, on Edge 0 0 0  Control/stop worrying 1 0 2  Worry too much - different things 1 0 2  Trouble relaxing 0 0 0  Restless 0 0 0  Easily annoyed or irritable 0 0 2  Afraid - awful might happen 1 0 2  Total GAD 7 Score 3 0 8  Anxiety Difficulty Somewhat difficult Not difficult at all Somewhat difficult     Patient Care Team: Blane Ohara, MD as PCP - General (Family Medicine) Regan Lemming, MD as PCP - Electrophysiology (  Cardiology) Georgeanna Lea, MD as PCP - Cardiology (Cardiology) Tressie Stalker, MD as Consulting Physician (Neurosurgery) Georgeanna Lea, MD as Consulting Physician (Cardiology) Karie Soda, MD as Consulting Physician (General Surgery) Drema Dallas, DO as Consulting Physician (Neurology) Earvin Hansen, [facility] (Inactive) as Pharmacist (Pharmacist) Kerin Salen, MD as Consulting Physician (Gastroenterology) Zehr, Princella Pellegrini, PA-C as Physician Assistant (Gastroenterology) Anthony Sar, MD as Consulting Physician (Nephrology)   Review of Systems  Constitutional:  Negative for chills, fatigue and fever.  HENT:  Positive for rhinorrhea. Negative for congestion, ear pain and sore throat.   Respiratory:  Negative for cough and shortness of breath.   Cardiovascular:  Negative for chest pain.  Gastrointestinal:  Positive for constipation. Negative for abdominal pain, diarrhea, nausea and vomiting.  Genitourinary:  Negative for dysuria and urgency.  Musculoskeletal:  Positive for arthralgias and back pain.  Negative for myalgias.  Neurological:  Positive for headaches. Negative for dizziness, weakness and light-headedness.  Psychiatric/Behavioral:  Negative for dysphoric mood. The patient is not nervous/anxious.     Current Outpatient Medications on File Prior to Visit  Medication Sig Dispense Refill   ARIPiprazole (ABILIFY) 2 MG tablet Take 1 tablet (2 mg total) by mouth daily. 90 tablet 1   aspirin 81 MG tablet Take 81 mg by mouth daily.     BREO ELLIPTA 100-25 MCG/ACT AEPB Inhale 1 puff into the lungs once daily in the afternoon as directed. 60 each 2   Buprenorphine HCl (BELBUCA) 900 MCG FILM Place 900 mcg inside cheek every 12 (twelve) hours.     carvedilol (COREG) 3.125 MG tablet Take 1 tablet (3.125 mg total) by mouth 2 (two) times daily with a meal. 180 tablet 1   denosumab (PROLIA) 60 MG/ML SOSY injection Inject 60 mg into the skin every 6 (six) months. 180 mL 2   diclofenac Sodium (VOLTAREN) 1 % GEL Apply 2 g topically 4 (four) times daily. (Patient taking differently: Apply 2 g topically daily as needed (Pain).) 50 g 4   EPINEPHRINE 0.3 mg/0.3 mL IJ SOAJ injection Inject 0.3 mLs (0.3 mg total) into the muscle as needed for anaphylaxis. (Patient not taking: Reported on 12/31/2022) 1 each 2   esomeprazole (NEXIUM) 20 MG capsule TAKE ONE CAPSULE BY MOUTH ONCE DAILY 90 capsule 1   fluticasone (FLONASE) 50 MCG/ACT nasal spray Instill TWO SPRAYS into BOTH nostrils daily AS NEEDED FOR congestion 18 g 3   gabapentin (NEURONTIN) 800 MG tablet Take 1 tablet (800 mg total) by mouth 3 (three) times daily. Patient usually takes twice a day (Patient taking differently: Take 800 mg by mouth 3 (three) times daily.) 270 tablet 2   Galcanezumab-gnlm (EMGALITY) 120 MG/ML SOSY Inject 120 mg into the skin every 30 (thirty) days. 3 mL 0   ipratropium-albuterol (DUONEB) 0.5-2.5 (3) MG/3ML SOLN Use 1 vial in nebulizer every 6 (six) hours as needed for shortness of breath 3 mL 11   levocetirizine (XYZAL) 5 MG  tablet Take 5 mg by mouth daily as needed for allergies.     megestrol (MEGACE) 20 MG tablet TAKE ONE TABLET BY MOUTH ONCE DAILY 90 tablet 2   Multiple Vitamin (MULTIVITAMIN WITH MINERALS) TABS tablet Take 1 tablet by mouth daily.     naloxone (NARCAN) nasal spray 4 mg/0.1 mL Place 1 spray into the nose once.     nitroGLYCERIN (NITROSTAT) 0.4 MG SL tablet Place 1 tablet (0.4 mg total) under the tongue every 5 (five) minutes as needed for chest pain. (Patient  not taking: Reported on 12/31/2022) 25 tablet 6   ondansetron (ZOFRAN) 4 MG tablet Take 1 tablet (4 mg total) by mouth every 8 (eight) hours as needed for nausea or vomiting. 20 tablet 0   oxyCODONE (ROXICODONE) 15 MG immediate release tablet Take 15 mg by mouth 3 (three) times daily as needed for pain.     ranolazine (RANEXA) 500 MG 12 hr tablet TAKE ONE TABLET BY MOUTH TWICE DAILY 180 tablet 3   rosuvastatin (CRESTOR) 20 MG tablet TAKE ONE TABLET BY MOUTH ONCE DAILY 90 tablet 1   sacubitril-valsartan (ENTRESTO) 24-26 MG Take 1 tablet by mouth 2 (two) times daily. 180 tablet 1   tiZANidine (ZANAFLEX) 4 MG tablet Take 4 mg by mouth at bedtime.     topiramate (TOPAMAX) 50 MG tablet TAKE TWO TABLETS BY MOUTH EVERYDAY AT BEDTIME 180 tablet 0   No current facility-administered medications on file prior to visit.   Past Medical History:  Diagnosis Date   Abdominal pain 10/17/2020   Abnormality of gait due to impairment of balance 11/22/2020   Acute diverticulitis 10/17/2020   Admission for long-term opiate analgesic use 10/24/2019   Arthritis of right hip 05/29/2016   Formatting of this note might be different from the original. Added automatically from request for surgery [id number]   BMI 26.0-26.9,adult 03/29/2020   Bronchial asthma    Cardiomyopathy (HCC)    Overview:  Ejection fraction 45% in 2015 Ejection fraction 30 to 35% in November 2018   Chronic back pain    Chronic constipation 07/31/2021   Chronic hypoxemic respiratory failure (HCC)  10/24/2019   Chronic narcotic use 03/23/2015   Chronic pain of both knees 12/21/2018   Added automatically from request for surgery [id number]  Formatting of this note might be different from the original. 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   Hypotension 05/17/2020   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   Ileus following gastrointestinal surgery (HCC) 03/26/2015   Major depressive disorder, single episode, moderate (HCC) 10/24/2019   Malnutrition of moderate degree (HCC) 10/20/2020   Mesenteric ischemia (HCC) 10/16/2020   Migraine without aura with status migrainosus 10/24/2019   Mixed incontinence 10/20/2020   Myocardial infarction (HCC)    Nausea & vomiting    Obstructive chronic bronchitis with exacerbation (HCC) 10/25/2019   Other spondylosis with radiculopathy, lumbar region 10/24/2019   Persistent vomiting 05/17/2020   Presence of left artificial hip joint 10/24/2019   PTSD (post-traumatic stress disorder)    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 03/23/2015 04/19/2014   Senile osteoporosis 10/24/2019   Sepsis (HCC) 10/17/2020   Serosanguineous chronic otitis media of right ear 08/02/2021    Small bowel obstruction (HCC)    Thyroid disease    Upper respiratory tract infection due to COVID-19 virus 07/06/2021  Wellness examination 03/27/2021   Past Surgical History:  Procedure Laterality Date   APPENDECTOMY     CARDIAC CATHETERIZATION     CARPAL TUNNEL RELEASE     CESAREAN SECTION     CHF s/p AICD   12/2010   CHOLECYSTECTOMY     COLONOSCOPY  10/16/2015   Mild colonic diverticulosis, predominantly in the left colon. Small internal hemrrhoids. Otherwise normal colonoscopy   COLONOSCOPY WITH PROPOFOL N/A 09/27/2021   Procedure: COLONOSCOPY WITH PROPOFOL;  Surgeon: Napoleon Form, MD;  Location: [facility];  Service: Endoscopy;  Laterality: N/A;   CORONARY ANGIOPLASTY     ESOPHAGOGASTRODUODENOSCOPY  02/04/2014   Mild gastritis. Otherwise normal EGD   HAND SURGERY Bilateral    ICD GENERATOR CHANGEOUT N/A 05/21/2019   Procedure: ICD GENERATOR CHANGEOUT;  Surgeon: Regan Lemming, MD;  Location: [facility] INVASIVE CV LAB;  Service: Cardiovascular;  Laterality: N/A;   ICD IMPLANT     Medtronic   intestinal blockage 2011     LAPAROSCOPIC ASSISTED VENTRAL HERNIA REPAIR N/A 03/23/2015   Procedure: LAPAROSCOPIC VENTRAL WALL HERNIA REPAIR;  Surgeon: Karie Soda, MD;  Location: [facility] ORS;  Service: General;  Laterality: N/A;  With MESH   LAPAROSCOPIC LYSIS OF ADHESIONS N/A 03/23/2015   Procedure: LAPAROSCOPIC LYSIS OF ADHESIONS;  Surgeon: Karie Soda, MD;  Location: [facility] ORS;  Service: General;  Laterality: N/A;   LSCS      x2   NASAL SEPTUM SURGERY     NECK SURGERY     fused   TONSILLECTOMY     TYMPANOSTOMY TUBE PLACEMENT Right 2024   ULNAR NERVE TRANSPOSITION  01/23/2012   Procedure: ULNAR NERVE DECOMPRESSION/TRANSPOSITION;  Surgeon: Cristi Loron, MD;  Location: [facility] NEURO ORS;  Service: Neurosurgery;  Laterality: Left;  LEFT ulnar nerve decompression    Family History  Problem Relation Age of Onset   Hypertension Mother    Heart attack Mother    Alcohol abuse  Father    Hypertension Father    Heart attack Father    Heart attack Other    Cancer Other    Heart failure Other    Anesthesia problems Neg Hx    Hypotension Neg Hx    Malignant hyperthermia Neg Hx    Pseudochol deficiency Neg Hx    Breast cancer Neg Hx    Social History   Socioeconomic History   Marital status: Single    Spouse name: Not on file   Number of children: 3   Years of education: Not on file   Highest education level: 10th grade  Occupational History   Occupation: disabled  Tobacco Use   Smoking status: Former    Current packs/day: 0.00    Average packs/day: 0.5 packs/day for 30.0 years (15.0 ttl pk-yrs)    Types: Cigarettes    Start date: 03/1990    Quit date: 03/2020    Years since quitting: 3.0   Smokeless tobacco: Never  Vaping Use   Vaping status: Never Used  Substance and Sexual Activity   Alcohol use: Yes    Alcohol/week: 2.0 standard drinks of alcohol    Types: 1 Cans of beer, 1 Standard drinks or equivalent per week    Comment: seldom   Drug use: No   Sexual activity: Not Currently  Other Topics Concern   Not on file  Social History Narrative   One level home with boyfriend   Caffeine - coffee 1-2 cups/day; Green tea 4-5 bottles a day   Exercise - some  Right handed      Social Determinants of Health   Financial Resource Strain: Low Risk  (04/05/2023)   Overall Financial Resource Strain (CARDIA)    Difficulty of Paying Living Expenses: Not very hard  Food Insecurity: No Food Insecurity (04/05/2023)   Hunger Vital Sign    Worried About Running Out of Food in the Last Year: Never true    Ran Out of Food in the Last Year: Never true  Transportation Needs: Unmet Transportation Needs (04/05/2023)   PRAPARE - Administrator, Civil Service (Medical): Yes    Lack of Transportation (Non-Medical): No  Physical Activity: Insufficiently Active (04/05/2023)   Exercise Vital Sign    Days of Exercise per Week: 3 days    Minutes of  Exercise per Session: 20 min  Stress: Stress Concern Present (04/05/2023)   [employer] of Occupational Health - Occupational Stress Questionnaire    Feeling of Stress : Very much  Social Connections: Moderately Isolated (04/05/2023)   Social Connection and Isolation Panel [NHANES]    Frequency of Communication with Friends and Family: More than three times a week    Frequency of Social Gatherings with Friends and Family: More than three times a week    Attends Religious Services: 1 to 4 times per year    Active Member of [employer] or Organizations: No    Attends Engineer, structural: Never    Marital Status: Divorced    Objective:  BP 118/70   Pulse 92   Temp (!) 97.1 F (36.2 C)   Ht 5' (1.524 m)   Wt 152 lb (68.9 kg)   SpO2 99%   BMI 29.69 kg/m      04/07/2023    1:57 PM 12/31/2022    3:34 PM 11/08/2022    1:21 PM  BP/Weight  Systolic BP 118 108 110  Diastolic BP 70 76 74  Wt. (Lbs) 152 147.6 150.2  BMI 29.69 kg/m2 28.83 kg/m2 29.33 kg/m2    Physical Exam Vitals reviewed.  Constitutional:      Appearance: Normal appearance. She is normal weight.  Neck:     Vascular: No carotid bruit.  Cardiovascular:     Rate and Rhythm: Normal rate and regular rhythm.     Heart sounds: Normal heart sounds.  Pulmonary:     Effort: Pulmonary effort is normal. No respiratory distress.     Breath sounds: Normal breath sounds.  Abdominal:     General: Abdomen is flat. Bowel sounds are normal.     Palpations: Abdomen is soft.     Tenderness: There is no abdominal tenderness.  Neurological:     Mental Status: She is alert and oriented to person, place, and time.  Psychiatric:        Mood and Affect: Mood normal.        Behavior: Behavior normal.     Diabetic Foot Exam - Simple   No data filed      Lab Results  Component Value Date   WBC 7.4 12/31/2022   HGB 10.3 (L) 12/31/2022   HCT 31.4 (L) 12/31/2022   PLT 272 12/31/2022   GLUCOSE 112 (H) 12/31/2022   CHOL  163 12/31/2022   TRIG 146 12/31/2022   HDL 46 12/31/2022   LDLCALC 91 12/31/2022   ALT 5 12/31/2022   AST 15 12/31/2022   NA 140 12/31/2022   K 4.3 12/31/2022   CL 105 12/31/2022   CREATININE 2.61 (H) 12/31/2022   BUN 44 (  H) 12/31/2022   CO2 18 (L) 12/31/2022   TSH 0.816 02/15/2021   INR 1.0 10/18/2020      Assessment & Plan:    Gastroesophageal reflux disease with esophagitis, unspecified whether hemorrhage Assessment & Plan: The current medical regimen is effective;  continue present plan and medications. Continue nexium 20 mg daily.    Mixed hyperlipidemia Assessment & Plan: Well controlled.  No changes to medicines. Continue Crestor 20 mg before bed.  Continue to work on eating a healthy diet and exercise.  Labs drawn today.    Orders: -     Comprehensive metabolic panel  Chronic systolic congestive heart failure, NYHA class 2 (HCC) Assessment & Plan: Stable.  Continue Entresto 24-26 mg daily, Ranexa 500 mg TWICE DAILY, Lasix 20 mg TWICE DAILY, Coreg 3.125 TWICE DAILY.  Management per specialist. Dr Bing Matter.    Orders: -     CBC with Differential/Platelet -     Furosemide; Take 1 tablet (20 mg total) by mouth daily.  Dispense: 90 tablet; Refill: 0  Chronic obstructive pulmonary disease, unspecified COPD type (HCC) Assessment & Plan: The current medical regimen is effective;  continue present plan and medications. Continue breo and duoneb.   Depression, major, recurrent, moderate (HCC) Assessment & Plan: Increase sertraline to 100 mg daily.  Continue Abilify 2 mg daily. Decrease diazepam to 2 mg after her 5 mg are gone.  Orders: -     diazePAM; Take 1 tablet (2 mg total) by mouth every 6 (six) hours as needed for anxiety.  Dispense: 30 tablet; Refill: 0 -     Sertraline HCl; Take 0.5 tablets (50 mg total) by mouth daily.  Dispense: 30 tablet; Refill: 2  Rheumatoid arthritis involving multiple sites, unspecified whether rheumatoid factor present  (HCC) -     Rheumatoid factor -     CYCLIC CITRUL PEPTIDE ANTIBODY, IGG/IGA -     Sedimentation rate -     C-reactive protein  Arthralgia of both hands -     ANA w/Reflex  Migraine without aura and without status migrainosus, not intractable Assessment & Plan: Bernita Raisin given. Continue Emgality monthly and Topamax 100 mg daily.  Orders: Bernita Raisin; Take 1 tablet (100 mg total) by mouth as needed (May repeat after 2 hours.  Maximum 2 tablets in 24 hours). TAKE 1 TABLET BY MOUTH BY MOUTH AS NEEDED( MAY REPEAT 1 TABLET AFTER 2 HOURS IF NEEDED, MAXIMUM 2 TABLETS IN 24 HOURS) Strength: 100 mg  Dispense: 48 tablet; Refill: 1  Therapeutic opioid induced constipation Assessment & Plan: Continue Amitiza 24 mcg twice daily  Orders: -     Lubiprostone; Take 1 capsule (24 mcg total) by mouth 2 (two) times daily with a meal.  Dispense: 60 capsule; Refill: 2  Chronic hypoxemic respiratory failure (HCC) Assessment & Plan: Uses oxygen at night only  If increased sob, pt to wear oxygen during the day.       Meds ordered this encounter  Medications   lubiprostone (AMITIZA) 24 MCG capsule    Sig: Take 1 capsule (24 mcg total) by mouth 2 (two) times daily with a meal.    Dispense:  60 capsule    Refill:  2   diazepam (VALIUM) 2 MG tablet    Sig: Take 1 tablet (2 mg total) by mouth every 6 (six) hours as needed for anxiety.    Dispense:  30 tablet    Refill:  0   Ubrogepant (UBRELVY) 100 MG TABS  Sig: Take 1 tablet (100 mg total) by mouth as needed (May repeat after 2 hours.  Maximum 2 tablets in 24 hours). TAKE 1 TABLET BY MOUTH BY MOUTH AS NEEDED( MAY REPEAT 1 TABLET AFTER 2 HOURS IF NEEDED, MAXIMUM 2 TABLETS IN 24 HOURS) Strength: 100 mg    Dispense:  48 tablet    Refill:  1   furosemide (LASIX) 20 MG tablet    Sig: Take 1 tablet (20 mg total) by mouth daily.    Dispense:  90 tablet    Refill:  0   sertraline (ZOLOFT) 100 MG tablet    Sig: Take 0.5 tablets (50 mg total) by mouth  daily.    Dispense:  30 tablet    Refill:  2    ZERO refills remain on this prescription. Your patient is requesting advance approval of refills for this medication to PREVENT ANY MISSED DOSES    Orders Placed This Encounter  Procedures   CBC with Differential/Platelet   Comprehensive metabolic panel   Rheumatoid factor   CYCLIC CITRUL PEPTIDE ANTIBODY, IGG/IGA   Sedimentation rate   C-reactive protein   ANA w/Reflex     Follow-up: Return in about 6 weeks (around 05/19/2023) for chronic follow up.   I,Katherina A Bramblett,acting as a scribe for Blane Ohara, MD.,have documented all relevant documentation on the behalf of Blane Ohara, MD,as directed by  Blane Ohara, MD while in the presence of Blane Ohara, MD.   An After Visit Summary was printed and given to the patient.  Blane Ohara, MD  Family Practice [phone]

## 2023-04-07 NOTE — Assessment & Plan Note (Signed)
Uses oxygen at night only  If increased sob, pt to wear oxygen during the day.

## 2023-04-07 NOTE — Assessment & Plan Note (Signed)
Increase sertraline to 100 mg daily.  Continue Abilify 2 mg daily. Decrease diazepam to 2 mg after her 5 mg are gone.

## 2023-04-07 NOTE — Assessment & Plan Note (Signed)
Stable.  Continue Entresto 24-26 mg daily, Ranexa 500 mg TWICE DAILY, Lasix 20 mg TWICE DAILY, Coreg 3.125 TWICE DAILY.  Management per specialist. Dr Bing Matter.

## 2023-04-07 NOTE — Assessment & Plan Note (Signed)
Well controlled.  No changes to medicines. Continue Crestor 20 mg before bed.  Continue to work on eating a healthy diet and exercise.  Labs drawn today.

## 2023-04-08 ENCOUNTER — Other Ambulatory Visit: Payer: Self-pay | Admitting: Family Medicine

## 2023-04-08 DIAGNOSIS — E785 Hyperlipidemia, unspecified: Secondary | ICD-10-CM

## 2023-04-10 DIAGNOSIS — R809 Proteinuria, unspecified: Secondary | ICD-10-CM | POA: Diagnosis not present

## 2023-04-10 DIAGNOSIS — I5022 Chronic systolic (congestive) heart failure: Secondary | ICD-10-CM | POA: Diagnosis not present

## 2023-04-10 DIAGNOSIS — N184 Chronic kidney disease, stage 4 (severe): Secondary | ICD-10-CM | POA: Diagnosis not present

## 2023-04-10 DIAGNOSIS — D631 Anemia in chronic kidney disease: Secondary | ICD-10-CM | POA: Diagnosis not present

## 2023-04-10 DIAGNOSIS — N2581 Secondary hyperparathyroidism of renal origin: Secondary | ICD-10-CM | POA: Diagnosis not present

## 2023-04-12 DIAGNOSIS — J449 Chronic obstructive pulmonary disease, unspecified: Secondary | ICD-10-CM | POA: Diagnosis not present

## 2023-04-17 ENCOUNTER — Other Ambulatory Visit: Payer: Self-pay | Admitting: Family Medicine

## 2023-04-17 DIAGNOSIS — M549 Dorsalgia, unspecified: Secondary | ICD-10-CM | POA: Diagnosis not present

## 2023-04-17 DIAGNOSIS — I2510 Atherosclerotic heart disease of native coronary artery without angina pectoris: Secondary | ICD-10-CM | POA: Diagnosis not present

## 2023-04-17 DIAGNOSIS — M4802 Spinal stenosis, cervical region: Secondary | ICD-10-CM | POA: Diagnosis not present

## 2023-04-17 DIAGNOSIS — M5030 Other cervical disc degeneration, unspecified cervical region: Secondary | ICD-10-CM | POA: Diagnosis not present

## 2023-04-17 DIAGNOSIS — M542 Cervicalgia: Secondary | ICD-10-CM | POA: Diagnosis not present

## 2023-04-17 DIAGNOSIS — Z79899 Other long term (current) drug therapy: Secondary | ICD-10-CM | POA: Diagnosis not present

## 2023-04-17 DIAGNOSIS — I509 Heart failure, unspecified: Secondary | ICD-10-CM | POA: Diagnosis not present

## 2023-04-17 DIAGNOSIS — G8929 Other chronic pain: Secondary | ICD-10-CM | POA: Diagnosis not present

## 2023-04-18 ENCOUNTER — Other Ambulatory Visit: Payer: Self-pay

## 2023-04-18 MED ORDER — FLUTICASONE PROPIONATE 50 MCG/ACT NA SUSP: NASAL | 3 refills | Status: DC

## 2023-04-21 DIAGNOSIS — Z79899 Other long term (current) drug therapy: Secondary | ICD-10-CM | POA: Diagnosis not present

## 2023-05-01 ENCOUNTER — Other Ambulatory Visit: Payer: Self-pay | Admitting: Family Medicine

## 2023-05-01 ENCOUNTER — Other Ambulatory Visit: Payer: Self-pay | Admitting: Neurology

## 2023-05-01 DIAGNOSIS — F331 Major depressive disorder, recurrent, moderate: Secondary | ICD-10-CM

## 2023-05-01 DIAGNOSIS — M19012 Primary osteoarthritis, left shoulder: Secondary | ICD-10-CM | POA: Diagnosis not present

## 2023-05-01 DIAGNOSIS — J9611 Chronic respiratory failure with hypoxia: Secondary | ICD-10-CM

## 2023-05-01 MED ORDER — DIAZEPAM 2 MG PO TABS
2.0000 mg | ORAL_TABLET | Freq: Four times a day (QID) | ORAL | 2 refills | Status: DC | PRN
Start: 2023-05-01 — End: 2023-06-25

## 2023-05-01 MED ORDER — FLUTICASONE FUROATE-VILANTEROL 100-25 MCG/ACT IN AEPB: 1.0000 | INHALATION_SPRAY | Freq: Every day | RESPIRATORY_TRACT | 2 refills | Status: DC

## 2023-05-06 ENCOUNTER — Ambulatory Visit: Payer: 59 | Admitting: Neurology

## 2023-05-13 DIAGNOSIS — J449 Chronic obstructive pulmonary disease, unspecified: Secondary | ICD-10-CM | POA: Diagnosis not present

## 2023-05-14 ENCOUNTER — Other Ambulatory Visit: Payer: 59

## 2023-05-16 DIAGNOSIS — N184 Chronic kidney disease, stage 4 (severe): Secondary | ICD-10-CM | POA: Diagnosis not present

## 2023-05-16 DIAGNOSIS — Z23 Encounter for immunization: Secondary | ICD-10-CM | POA: Diagnosis not present

## 2023-05-16 DIAGNOSIS — I509 Heart failure, unspecified: Secondary | ICD-10-CM | POA: Diagnosis not present

## 2023-05-16 DIAGNOSIS — M542 Cervicalgia: Secondary | ICD-10-CM | POA: Diagnosis not present

## 2023-05-16 DIAGNOSIS — I2510 Atherosclerotic heart disease of native coronary artery without angina pectoris: Secondary | ICD-10-CM | POA: Diagnosis not present

## 2023-05-16 DIAGNOSIS — M549 Dorsalgia, unspecified: Secondary | ICD-10-CM | POA: Diagnosis not present

## 2023-05-16 DIAGNOSIS — M5136 Other intervertebral disc degeneration, lumbar region: Secondary | ICD-10-CM | POA: Diagnosis not present

## 2023-05-16 DIAGNOSIS — M4802 Spinal stenosis, cervical region: Secondary | ICD-10-CM | POA: Diagnosis not present

## 2023-05-19 ENCOUNTER — Ambulatory Visit: Payer: 59 | Admitting: Family Medicine

## 2023-05-22 ENCOUNTER — Other Ambulatory Visit: Payer: Self-pay

## 2023-05-22 NOTE — Telephone Encounter (Signed)
PA submitted and approved via covermymeds for ubrelvy. ?

## 2023-05-28 DIAGNOSIS — J449 Chronic obstructive pulmonary disease, unspecified: Secondary | ICD-10-CM | POA: Diagnosis not present

## 2023-05-29 ENCOUNTER — Ambulatory Visit: Payer: 59

## 2023-06-02 ENCOUNTER — Other Ambulatory Visit: Payer: Self-pay | Admitting: Cardiology

## 2023-06-02 ENCOUNTER — Ambulatory Visit (INDEPENDENT_AMBULATORY_CARE_PROVIDER_SITE_OTHER): Payer: 59 | Admitting: Family Medicine

## 2023-06-02 ENCOUNTER — Encounter: Payer: Self-pay | Admitting: Family Medicine

## 2023-06-02 ENCOUNTER — Other Ambulatory Visit: Payer: Self-pay | Admitting: Family Medicine

## 2023-06-02 VITALS — BP 118/68 | HR 96 | Temp 96.1°F | Resp 16 | Ht 60.0 in | Wt 157.0 lb

## 2023-06-02 DIAGNOSIS — R600 Localized edema: Secondary | ICD-10-CM | POA: Diagnosis not present

## 2023-06-02 DIAGNOSIS — I5022 Chronic systolic (congestive) heart failure: Secondary | ICD-10-CM | POA: Diagnosis not present

## 2023-06-02 DIAGNOSIS — M4726 Other spondylosis with radiculopathy, lumbar region: Secondary | ICD-10-CM | POA: Diagnosis not present

## 2023-06-02 DIAGNOSIS — F331 Major depressive disorder, recurrent, moderate: Secondary | ICD-10-CM | POA: Diagnosis not present

## 2023-06-02 DIAGNOSIS — D638 Anemia in other chronic diseases classified elsewhere: Secondary | ICD-10-CM

## 2023-06-02 MED ORDER — FUROSEMIDE 20 MG PO TABS
ORAL_TABLET | ORAL | 1 refills | Status: DC
Start: 2023-06-02 — End: 2023-06-25

## 2023-06-02 MED ORDER — ARIPIPRAZOLE 5 MG PO TABS
5.0000 mg | ORAL_TABLET | Freq: Every day | ORAL | 2 refills | Status: DC
Start: 2023-06-02 — End: 2023-07-22

## 2023-06-02 NOTE — Progress Notes (Unsigned)
Subjective:  Patient ID: [name], female    DOB: [birth year]  Age: 67 y.o. MRN: [id number]  Chief Complaint  Patient presents with   Congestive Heart Failure   Anemia    HPI  I have been weaning patient off diazepam per pain management request. I changed diazepam 5 mg to 2 mg and then was to complete 2 mg and then discontinue. She has been tolerating lower dosing very well. Currently on zoloft 100 mg daily and abilify 2 mg once daily. Having insomnia.   Complaining of increasing swelling of BL feet. Has DYSPNEA ON EXERTION with stairs.  Denies chest pain.     06/02/2023    4:01 PM 04/07/2023    1:58 PM 12/31/2022    3:37 PM 10/10/2022    4:32 PM 03/20/2022   10:47 AM  Depression screen PHQ 2/9  Decreased Interest 2 0 1 3 0  Down, Depressed, Hopeless 2 1 3 3 1   PHQ - 2 Score 4 1 4 6 1   Altered sleeping 0 3 0 0 1  Tired, decreased energy 2 0 1 3 2   Change in appetite 3 3 1 3 2   Feeling bad or failure about yourself  1 2 0 0 2  Trouble concentrating 0 0 1 0 1  Moving slowly or fidgety/restless 0 1 0 0 0  Suicidal thoughts 0 0 0 0 0  PHQ-9 Score 10 10 7 12 9   Difficult doing work/chores Somewhat difficult Very difficult Not difficult at all Not difficult at all Not difficult at all        06/02/2023    4:00 PM  Fall Risk   Falls in the past year? 0  Number falls in past yr: 0  Injury with Fall? 0  Risk for fall due to : Impaired balance/gait  Follow up Falls evaluation completed;Follow up appointment    Patient Care Team: Blane Ohara, MD as PCP - General (Family Medicine) Regan Lemming, MD as PCP - Electrophysiology (Cardiology) Georgeanna Lea, MD as PCP - Cardiology (Cardiology) Tressie Stalker, MD as Consulting Physician (Neurosurgery) Georgeanna Lea, MD as Consulting Physician (Cardiology) Karie Soda, MD as Consulting Physician (General Surgery) Drema Dallas, DO as Consulting Physician (Neurology) Earvin Hansen, [facility] (Inactive) as  Pharmacist (Pharmacist) Kerin Salen, MD as Consulting Physician (Gastroenterology) Zehr, Princella Pellegrini, PA-C as Physician Assistant (Gastroenterology) Anthony Sar, MD as Consulting Physician (Nephrology)   Review of Systems  Constitutional:  Negative for chills, fatigue and fever.  HENT:  Negative for congestion, rhinorrhea and sore throat.   Respiratory:  Positive for shortness of breath. Negative for cough.   Cardiovascular:  Positive for leg swelling. Negative for chest pain.  Gastrointestinal:  Negative for abdominal pain, constipation, diarrhea, nausea and vomiting.  Genitourinary:  Negative for dysuria and urgency.  Musculoskeletal:  Negative for back pain and myalgias.  Neurological:  Positive for headaches. Negative for dizziness, weakness and light-headedness.  Psychiatric/Behavioral:  Negative for dysphoric mood. The patient is not nervous/anxious.     Current Outpatient Medications on File Prior to Visit  Medication Sig Dispense Refill   aspirin 81 MG tablet Take 81 mg by mouth daily.     Buprenorphine HCl (BELBUCA) 900 MCG FILM Place 900 mcg inside cheek every 12 (twelve) hours.     denosumab (PROLIA) 60 MG/ML SOSY injection Inject 60 mg into the skin every 6 (six) months. 180 mL 2   diazepam (VALIUM) 2 MG tablet Take 1 tablet (2 mg  total) by mouth every 6 (six) hours as needed for anxiety. 30 tablet 2   diclofenac Sodium (VOLTAREN) 1 % GEL Apply 2 g topically 4 (four) times daily. (Patient taking differently: Apply 2 g topically daily as needed (Pain).) 50 g 4   EPINEPHRINE 0.3 mg/0.3 mL IJ SOAJ injection Inject 0.3 mLs (0.3 mg total) into the muscle as needed for anaphylaxis. (Patient not taking: Reported on 12/31/2022) 1 each 2   esomeprazole (NEXIUM) 20 MG capsule TAKE ONE CAPSULE BY MOUTH ONCE DAILY 90 capsule 1   fluticasone (FLONASE) 50 MCG/ACT nasal spray Instill TWO SPRAYS into BOTH nostrils daily AS NEEDED FOR congestion 18 g 3   gabapentin (NEURONTIN) 800 MG tablet Take 1  tablet (800 mg total) by mouth 3 (three) times daily. Patient usually takes twice a day (Patient taking differently: Take 800 mg by mouth 3 (three) times daily.) 270 tablet 2   Galcanezumab-gnlm (EMGALITY) 120 MG/ML SOSY Inject 120 mg into the skin every 30 (thirty) days. 3 mL 0   ipratropium-albuterol (DUONEB) 0.5-2.5 (3) MG/3ML SOLN Use 1 vial in nebulizer every 6 (six) hours as needed for shortness of breath 3 mL 11   levocetirizine (XYZAL) 5 MG tablet Take 5 mg by mouth daily as needed for allergies.     lubiprostone (AMITIZA) 24 MCG capsule Take 1 capsule (24 mcg total) by mouth 2 (two) times daily with a meal. 60 capsule 2   megestrol (MEGACE) 20 MG tablet TAKE ONE TABLET BY MOUTH ONCE DAILY 90 tablet 2   Multiple Vitamin (MULTIVITAMIN WITH MINERALS) TABS tablet Take 1 tablet by mouth daily.     naloxone (NARCAN) nasal spray 4 mg/0.1 mL Place 1 spray into the nose once.     nitroGLYCERIN (NITROSTAT) 0.4 MG SL tablet Place 1 tablet (0.4 mg total) under the tongue every 5 (five) minutes as needed for chest pain. (Patient not taking: Reported on 12/31/2022) 25 tablet 6   ondansetron (ZOFRAN) 4 MG tablet Take 1 tablet (4 mg total) by mouth every 8 (eight) hours as needed for nausea or vomiting. 20 tablet 0   oxyCODONE (ROXICODONE) 15 MG immediate release tablet Take 15 mg by mouth 3 (three) times daily as needed for pain.     rosuvastatin (CRESTOR) 20 MG tablet TAKE ONE TABLET BY MOUTH ONCE DAILY 90 tablet 1   sertraline (ZOLOFT) 100 MG tablet Take 0.5 tablets (50 mg total) by mouth daily. 30 tablet 2   tiZANidine (ZANAFLEX) 4 MG tablet Take 4 mg by mouth at bedtime.     Ubrogepant (UBRELVY) 100 MG TABS Take 1 tablet (100 mg total) by mouth as needed (May repeat after 2 hours.  Maximum 2 tablets in 24 hours). TAKE 1 TABLET BY MOUTH BY MOUTH AS NEEDED( MAY REPEAT 1 TABLET AFTER 2 HOURS IF NEEDED, MAXIMUM 2 TABLETS IN 24 HOURS) Strength: 100 mg 48 tablet 1   No current facility-administered medications  on file prior to visit.   Past Medical History:  Diagnosis Date   Abdominal pain 10/17/2020   Abnormality of gait due to impairment of balance 11/22/2020   Acute diverticulitis 10/17/2020   Admission for long-term opiate analgesic use 10/24/2019   Arthritis of right hip 05/29/2016   Formatting of this note might be different from the original. Added automatically from request for surgery [id number]   BMI 26.0-26.9,adult 03/29/2020   Bronchial asthma    Cardiomyopathy (HCC)    Overview:  Ejection fraction 45% in 2015 Ejection fraction 30 to 35%  in November 2018   Chronic back pain    Chronic constipation 07/31/2021   Chronic hypoxemic respiratory failure (HCC) 10/24/2019   Chronic narcotic use 03/23/2015   Chronic pain of both knees 12/21/2018   Added automatically from request for surgery [id number]  Formatting of this note might be different from the original. 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   Hypotension 05/17/2020   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   Ileus following gastrointestinal surgery (HCC) 03/26/2015   Major depressive disorder, single episode, moderate (HCC) 10/24/2019   Malnutrition of moderate degree (HCC)  10/20/2020   Mesenteric ischemia (HCC) 10/16/2020   Migraine without aura with status migrainosus 10/24/2019   Mixed incontinence 10/20/2020   Myocardial infarction (HCC)    Nausea & vomiting    Obstructive chronic bronchitis with exacerbation (HCC) 10/25/2019   Other spondylosis with radiculopathy, lumbar region 10/24/2019   Persistent vomiting 05/17/2020   Presence of left artificial hip joint 10/24/2019   PTSD (post-traumatic stress disorder)    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 03/23/2015 04/19/2014   Senile osteoporosis 10/24/2019   Sepsis (HCC) 10/17/2020   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 Small internal hemrrhoids. Otherwise normal colonoscopy   COLONOSCOPY WITH PROPOFOL N/A 09/27/2021   Procedure: COLONOSCOPY WITH PROPOFOL;  Surgeon: Napoleon Form, MD;  Location: [facility];  Service: Endoscopy;  Laterality: N/A;   CORONARY ANGIOPLASTY     ESOPHAGOGASTRODUODENOSCOPY  02/04/2014   Mild gastritis. Otherwise normal EGD   HAND SURGERY Bilateral    ICD GENERATOR CHANGEOUT N/A 05/21/2019   Procedure: ICD GENERATOR CHANGEOUT;  Surgeon: Regan Lemming, MD;  Location: [facility] INVASIVE CV LAB;  Service: Cardiovascular;  Laterality: N/A;   ICD IMPLANT     Medtronic   intestinal blockage 2011     LAPAROSCOPIC ASSISTED VENTRAL HERNIA REPAIR N/A 03/23/2015   Procedure: LAPAROSCOPIC VENTRAL WALL HERNIA REPAIR;  Surgeon: Karie Soda, MD;  Location: [facility] ORS;  Service: General;  Laterality:  N/A;  With MESH   LAPAROSCOPIC LYSIS OF ADHESIONS N/A 03/23/2015   Procedure: LAPAROSCOPIC LYSIS OF ADHESIONS;  Surgeon: Karie Soda, MD;  Location: [facility] ORS;  Service: General;  Laterality: N/A;   LSCS      x2   NASAL SEPTUM SURGERY  NECK SURGERY     fused   TONSILLECTOMY     TYMPANOSTOMY TUBE PLACEMENT Right 2024   ULNAR NERVE TRANSPOSITION  01/23/2012   Procedure: ULNAR NERVE DECOMPRESSION/TRANSPOSITION;  Surgeon: Cristi Loron, MD;  Location: [facility] NEURO ORS;  Service: Neurosurgery;  Laterality: Left;  LEFT ulnar nerve decompression    Family History  Problem Relation Age of Onset   Hypertension Mother    Heart attack Mother    Alcohol abuse Father    Hypertension Father    Heart attack Father    Heart attack Other    Cancer Other    Heart failure Other    Anesthesia problems Neg Hx    Hypotension Neg Hx    Malignant hyperthermia Neg Hx    Pseudochol deficiency Neg Hx    Breast cancer Neg Hx    Social History   Socioeconomic History   Marital status: Single    Spouse name: Not on file   Number of children: 3   Years of education: Not on file   Highest education level: 10th grade  Occupational History   Occupation: disabled  Tobacco Use   Smoking status: Former    Current packs/day: 0.00    Average packs/day: 0.5 packs/day for 30.0 years (15.0 ttl pk-yrs)    Types: Cigarettes    Start date: 03/1990    Quit date: 03/2020    Years since quitting: 3.2   Smokeless tobacco: Never  Vaping Use   Vaping status: Never Used  Substance and Sexual Activity   Alcohol use: Yes    Alcohol/week: 2.0 standard drinks of alcohol    Types: 1 Cans of beer, 1 Standard drinks or equivalent per week    Comment: seldom   Drug use: No   Sexual activity: Not Currently  Other Topics Concern   Not on file  Social History Narrative   One level home with boyfriend   Caffeine - coffee 1-2 cups/day; Green tea 4-5 bottles a day   Exercise - some    Right handed      Social  Determinants of Health   Financial Resource Strain: Low Risk  (04/05/2023)   Overall Financial Resource Strain (CARDIA)    Difficulty of Paying Living Expenses: Not very hard  Food Insecurity: No Food Insecurity (04/05/2023)   Hunger Vital Sign    Worried About Running Out of Food in the Last Year: Never true    Ran Out of Food in the Last Year: Never true  Transportation Needs: Unmet Transportation Needs (04/05/2023)   PRAPARE - Administrator, Civil Service (Medical): Yes    Lack of Transportation (Non-Medical): No  Physical Activity: Insufficiently Active (04/05/2023)   Exercise Vital Sign    Days of Exercise per Week: 3 days    Minutes of Exercise per Session: 20 min  Stress: Stress Concern Present (04/05/2023)   [employer] of Occupational Health - Occupational Stress Questionnaire    Feeling of Stress : Very much  Social Connections: Moderately Isolated (04/05/2023)   Social Connection and Isolation Panel [NHANES]    Frequency of Communication with Friends and Family: More than three times a week    Frequency of Social Gatherings with Friends and Family: More than three times a week    Attends Religious Services: 1 to 4 times per year    Active Member of [employer] or Organizations: No    Attends Banker Meetings: Never    Marital Status: Divorced  Objective:  BP 118/68   Pulse 96   Temp (!) 96.1 F (35.6 C)   Resp 16   Ht 5' (1.524 m)   Wt 157 lb (71.2 kg)   BMI 30.66 kg/m      06/02/2023    3:57 PM 04/07/2023    1:57 PM 12/31/2022    3:34 PM  BP/Weight  Systolic BP 118 118 108  Diastolic BP 68 70 76  Wt. (Lbs) 157 152 147.6  BMI 30.66 kg/m2 29.69 kg/m2 28.83 kg/m2    Physical Exam Vitals reviewed.  Constitutional:      Appearance: Normal appearance. She is normal weight.  Neck:     Vascular: No carotid bruit.  Cardiovascular:     Rate and Rhythm: Normal rate and regular rhythm.     Heart sounds: Normal heart sounds.  Pulmonary:      Effort: Pulmonary effort is normal. No respiratory distress.     Breath sounds: Normal breath sounds.  Abdominal:     General: Abdomen is flat. Bowel sounds are normal.     Palpations: Abdomen is soft.     Tenderness: There is no abdominal tenderness.  Neurological:     Mental Status: She is alert and oriented to person, place, and time.  Psychiatric:        Mood and Affect: Mood normal.        Behavior: Behavior normal.     Diabetic Foot Exam - Simple   No data filed      Lab Results  Component Value Date   WBC 7.5 06/02/2023   HGB 10.7 (L) 06/02/2023   HCT 34.2 06/02/2023   PLT 321 06/02/2023   GLUCOSE 110 (H) 06/02/2023   CHOL 163 12/31/2022   TRIG 146 12/31/2022   HDL 46 12/31/2022   LDLCALC 91 12/31/2022   ALT 4 06/02/2023   AST 12 06/02/2023   NA 142 06/02/2023   K 4.7 06/02/2023   CL 110 (H) 06/02/2023   CREATININE 1.75 (H) 06/02/2023   BUN 19 06/02/2023   CO2 16 (L) 06/02/2023   TSH 1.030 06/02/2023   INR 1.0 10/18/2020      Assessment & Plan:    Pedal edema Assessment & Plan: Check labs. Sent Furosemide.  Orders: -     Comprehensive metabolic panel -     TSH -     Furosemide; One in am and one at lunch  Dispense: 60 tablet; Refill: 1  Anemia of chronic disease Assessment & Plan: Check CBC.  Orders: -     CBC with Differential/Platelet  Chronic systolic congestive heart failure, NYHA class 2 (HCC) Assessment & Plan: Sent Furosemide.  Orders: -     Furosemide; One in am and one at lunch  Dispense: 60 tablet; Refill: 1  Depression, major, recurrent, moderate (HCC) Assessment & Plan: Change abilify to 5 mg before bed.  Continue zoloft at 100 mg daily.  Discontinue valium when new dose is gone.  Recommend melatonin 5 mg before bed.    Orders: -     ARIPiprazole; Take 1 tablet (5 mg total) by mouth daily.  Dispense: 30 tablet; Refill: 2  Other spondylosis with radiculopathy, lumbar region Assessment & Plan: Stop celebrex. Patient  has CKD and should not be taking an nsaid.      Meds ordered this encounter  Medications   furosemide (LASIX) 20 MG tablet    Sig: One in am and one at lunch    Dispense:  60 tablet  Refill:  1   ARIPiprazole (ABILIFY) 5 MG tablet    Sig: Take 1 tablet (5 mg total) by mouth daily.    Dispense:  30 tablet    Refill:  2    Orders Placed This Encounter  Procedures   CBC with Differential/Platelet   Comprehensive metabolic panel   TSH     Follow-up: Return in about 3 weeks (around 06/23/2023) for Dina, chronic follow up.   I,Carolyn M Morrison,acting as a Neurosurgeon for Blane Ohara, MD.,have documented all relevant documentation on the behalf of Blane Ohara, MD,as directed by  Blane Ohara, MD while in the presence of Blane Ohara, MD.   Clayborn Bigness I Leal-Borjas,acting as a scribe for Blane Ohara, MD.,have documented all relevant documentation on the behalf of Blane Ohara, MD,as directed by  Blane Ohara, MD while in the presence of Blane Ohara, MD.    An After Visit Summary was printed and given to the patient.  I attest that I have reviewed this visit and agree with the plan scribed by my staff.   Blane Ohara, MD [facility] [phone]

## 2023-06-02 NOTE — Patient Instructions (Signed)
Change abilify to 5 mg before bed.  Continue zoloft at 100 mg daily.  Discontinue valium when new dose is gone.  Recommend melatonin 5 mg before bed.  Stop celebrex.

## 2023-06-03 ENCOUNTER — Other Ambulatory Visit: Payer: Self-pay | Admitting: Family Medicine

## 2023-06-03 LAB — COMPREHENSIVE METABOLIC PANEL
ALT: 4 [IU]/L (ref 0–32)
AST: 12 [IU]/L (ref 0–40)
Albumin: 4.4 g/dL (ref 3.9–4.9)
Alkaline Phosphatase: 67 [IU]/L (ref 44–121)
BUN/Creatinine Ratio: 11 — ABNORMAL LOW (ref 12–28)
BUN: 19 mg/dL (ref 8–27)
Bilirubin Total: 0.2 mg/dL (ref 0.0–1.2)
CO2: 16 mmol/L — ABNORMAL LOW (ref 20–29)
Calcium: 9.7 mg/dL (ref 8.7–10.3)
Chloride: 110 mmol/L — ABNORMAL HIGH (ref 96–106)
Creatinine, Ser: 1.75 mg/dL — ABNORMAL HIGH (ref 0.57–1.00)
Globulin, Total: 2.3 g/dL (ref 1.5–4.5)
Glucose: 110 mg/dL — ABNORMAL HIGH (ref 70–99)
Potassium: 4.7 mmol/L (ref 3.5–5.2)
Sodium: 142 mmol/L (ref 134–144)
Total Protein: 6.7 g/dL (ref 6.0–8.5)
eGFR: 31 mL/min/{1.73_m2} — ABNORMAL LOW (ref >59)

## 2023-06-03 LAB — CBC WITH DIFFERENTIAL/PLATELET
Basophils Absolute: 0.1 10*3/uL (ref 0.0–0.2)
Basos: 1 %
EOS (ABSOLUTE): 0.2 10*3/uL (ref 0.0–0.4)
Eos: 3 %
Hematocrit: 34.2 % (ref 34.0–46.6)
Hemoglobin: 10.7 g/dL — ABNORMAL LOW (ref 11.1–15.9)
Immature Grans (Abs): 0 10*3/uL (ref 0.0–0.1)
Immature Granulocytes: 0 %
Lymphocytes Absolute: 2.1 10*3/uL (ref 0.7–3.1)
Lymphs: 27 %
MCH: 29.8 pg (ref 26.6–33.0)
MCHC: 31.3 g/dL — ABNORMAL LOW (ref 31.5–35.7)
MCV: 95 fL (ref 79–97)
Monocytes Absolute: 0.5 10*3/uL (ref 0.1–0.9)
Monocytes: 6 %
Neutrophils Absolute: 4.7 10*3/uL (ref 1.4–7.0)
Neutrophils: 63 %
Platelets: 321 10*3/uL (ref 150–450)
RBC: 3.59 x10E6/uL — ABNORMAL LOW (ref 3.77–5.28)
RDW: 11.7 % (ref 11.7–15.4)
WBC: 7.5 10*3/uL (ref 3.4–10.8)

## 2023-06-03 LAB — TSH: TSH: 1.03 u[IU]/mL (ref 0.450–4.500)

## 2023-06-03 NOTE — Telephone Encounter (Signed)
Rx refill sent to pharmacy. 

## 2023-06-04 ENCOUNTER — Ambulatory Visit (INDEPENDENT_AMBULATORY_CARE_PROVIDER_SITE_OTHER): Payer: 59

## 2023-06-04 DIAGNOSIS — I420 Dilated cardiomyopathy: Secondary | ICD-10-CM

## 2023-06-04 DIAGNOSIS — M19012 Primary osteoarthritis, left shoulder: Secondary | ICD-10-CM | POA: Diagnosis not present

## 2023-06-04 LAB — CUP PACEART REMOTE DEVICE CHECK
Battery Remaining Longevity: 86 mo
Battery Voltage: 3 V
Brady Statistic AP VP Percent: 0 %
Brady Statistic AP VS Percent: 0.65 %
Brady Statistic AS VP Percent: 0.04 %
Brady Statistic AS VS Percent: 99.31 %
Brady Statistic RA Percent Paced: 0.65 %
Brady Statistic RV Percent Paced: 0.04 %
Date Time Interrogation Session: 20241009001605
HighPow Impedance: 67 Ohm
Implantable Lead Connection Status: 753985
Implantable Lead Connection Status: 753985
Implantable Lead Implant Date: 20120502
Implantable Lead Implant Date: 20120502
Implantable Lead Location: 753859
Implantable Lead Location: 753860
Implantable Lead Model: 4076
Implantable Lead Model: 7122
Implantable Pulse Generator Implant Date: 20200925
Lead Channel Impedance Value: 342 Ohm
Lead Channel Impedance Value: 589 Ohm
Lead Channel Impedance Value: 703 Ohm
Lead Channel Pacing Threshold Amplitude: 0.5 V
Lead Channel Pacing Threshold Amplitude: 1 V
Lead Channel Pacing Threshold Pulse Width: 0.4 ms
Lead Channel Pacing Threshold Pulse Width: 0.4 ms
Lead Channel Sensing Intrinsic Amplitude: 1.875 mV
Lead Channel Sensing Intrinsic Amplitude: 1.875 mV
Lead Channel Sensing Intrinsic Amplitude: 11.25 mV
Lead Channel Sensing Intrinsic Amplitude: 11.25 mV
Lead Channel Setting Pacing Amplitude: 1.5 V
Lead Channel Setting Pacing Amplitude: 2 V
Lead Channel Setting Pacing Pulse Width: 0.4 ms
Lead Channel Setting Sensing Sensitivity: 0.3 mV
Zone Setting Status: 755011
Zone Setting Status: 755011

## 2023-06-07 DIAGNOSIS — D638 Anemia in other chronic diseases classified elsewhere: Secondary | ICD-10-CM

## 2023-06-07 DIAGNOSIS — R600 Localized edema: Secondary | ICD-10-CM | POA: Insufficient documentation

## 2023-06-07 HISTORY — DX: Anemia in other chronic diseases classified elsewhere: D63.8

## 2023-06-07 HISTORY — DX: Localized edema: R60.0

## 2023-06-07 NOTE — Assessment & Plan Note (Signed)
Check CBC

## 2023-06-07 NOTE — Assessment & Plan Note (Signed)
Sent Furosemide.

## 2023-06-07 NOTE — Assessment & Plan Note (Signed)
Stop celebrex. Patient has CKD and should not be taking an nsaid.

## 2023-06-07 NOTE — Assessment & Plan Note (Signed)
Check labs. Sent Furosemide.

## 2023-06-07 NOTE — Assessment & Plan Note (Signed)
Change abilify to 5 mg before bed.  Continue zoloft at 100 mg daily.  Discontinue valium when new dose is gone.  Recommend melatonin 5 mg before bed.

## 2023-06-12 DIAGNOSIS — J449 Chronic obstructive pulmonary disease, unspecified: Secondary | ICD-10-CM | POA: Diagnosis not present

## 2023-06-13 DIAGNOSIS — G8929 Other chronic pain: Secondary | ICD-10-CM | POA: Diagnosis not present

## 2023-06-13 DIAGNOSIS — M51369 Other intervertebral disc degeneration, lumbar region without mention of lumbar back pain or lower extremity pain: Secondary | ICD-10-CM | POA: Diagnosis not present

## 2023-06-13 DIAGNOSIS — M542 Cervicalgia: Secondary | ICD-10-CM | POA: Diagnosis not present

## 2023-06-13 DIAGNOSIS — M4802 Spinal stenosis, cervical region: Secondary | ICD-10-CM | POA: Diagnosis not present

## 2023-06-13 DIAGNOSIS — Z79899 Other long term (current) drug therapy: Secondary | ICD-10-CM | POA: Diagnosis not present

## 2023-06-13 DIAGNOSIS — I509 Heart failure, unspecified: Secondary | ICD-10-CM | POA: Diagnosis not present

## 2023-06-13 DIAGNOSIS — I2510 Atherosclerotic heart disease of native coronary artery without angina pectoris: Secondary | ICD-10-CM | POA: Diagnosis not present

## 2023-06-13 DIAGNOSIS — M549 Dorsalgia, unspecified: Secondary | ICD-10-CM | POA: Diagnosis not present

## 2023-06-17 DIAGNOSIS — Z79899 Other long term (current) drug therapy: Secondary | ICD-10-CM | POA: Diagnosis not present

## 2023-06-19 ENCOUNTER — Other Ambulatory Visit: Payer: Self-pay

## 2023-06-19 DIAGNOSIS — E440 Moderate protein-calorie malnutrition: Secondary | ICD-10-CM

## 2023-06-19 MED ORDER — MEGESTROL ACETATE 20 MG PO TABS: 20.0000 mg | ORAL_TABLET | Freq: Every day | ORAL | 2 refills | Status: DC

## 2023-06-23 ENCOUNTER — Ambulatory Visit (INDEPENDENT_AMBULATORY_CARE_PROVIDER_SITE_OTHER): Payer: 59 | Admitting: Family Medicine

## 2023-06-23 ENCOUNTER — Encounter: Payer: Self-pay | Admitting: Family Medicine

## 2023-06-23 VITALS — BP 122/68 | HR 89 | Temp 97.1°F | Ht 61.5 in | Wt 160.0 lb

## 2023-06-23 DIAGNOSIS — K2100 Gastro-esophageal reflux disease with esophagitis, without bleeding: Secondary | ICD-10-CM | POA: Diagnosis not present

## 2023-06-23 DIAGNOSIS — J449 Chronic obstructive pulmonary disease, unspecified: Secondary | ICD-10-CM | POA: Diagnosis not present

## 2023-06-23 DIAGNOSIS — R600 Localized edema: Secondary | ICD-10-CM | POA: Diagnosis not present

## 2023-06-23 DIAGNOSIS — G43009 Migraine without aura, not intractable, without status migrainosus: Secondary | ICD-10-CM

## 2023-06-23 DIAGNOSIS — I5022 Chronic systolic (congestive) heart failure: Secondary | ICD-10-CM | POA: Diagnosis not present

## 2023-06-23 DIAGNOSIS — R5383 Other fatigue: Secondary | ICD-10-CM

## 2023-06-23 DIAGNOSIS — E782 Mixed hyperlipidemia: Secondary | ICD-10-CM

## 2023-06-23 DIAGNOSIS — D649 Anemia, unspecified: Secondary | ICD-10-CM | POA: Insufficient documentation

## 2023-06-23 DIAGNOSIS — F331 Major depressive disorder, recurrent, moderate: Secondary | ICD-10-CM

## 2023-06-23 DIAGNOSIS — R739 Hyperglycemia, unspecified: Secondary | ICD-10-CM | POA: Insufficient documentation

## 2023-06-23 HISTORY — DX: Hyperglycemia, unspecified: R73.9

## 2023-06-23 HISTORY — DX: Other fatigue: R53.83

## 2023-06-23 HISTORY — DX: Anemia, unspecified: D64.9

## 2023-06-23 NOTE — Assessment & Plan Note (Signed)
Not improving. Labs from today. Will await results for further recommendation.

## 2023-06-23 NOTE — Assessment & Plan Note (Addendum)
Acute Labs drawn Await results for assessment and recommendation.

## 2023-06-23 NOTE — Assessment & Plan Note (Signed)
Well-controlled. Labs drawn today. No changes to medication. Continue Crestor 20 mg before bed. Continue to work on eating a healthy diet and exercising.

## 2023-06-23 NOTE — Assessment & Plan Note (Signed)
Last glucose was elevated at 110.  Patient has no history of diabetes. Labs drawn today. Await labs/testing for assessment and recommendations.

## 2023-06-23 NOTE — Progress Notes (Signed)
Subjective:  Patient ID: [name], female    DOB: 1955/09/29  Age: 67 y.o. MRN: [id number]  Chief Complaint  Patient presents with   Medical Management of Chronic Issues    HPI   Depression/Anxiety: Diazepam 5 mg THREE TIMES A DAY as needed, sertraline 50 mg daily, Abilify 2 mg daily. Does not see psychiatry.   Migraines; Aimovig 140 mg monthly and topamax 100 mg daily. Has 5-6 migraines a month, resolves quickly with use of ubrelvy. Uses Ubrelvy 100 mg daily and Patient was discharged by Dr. Everlena Cooper.     GERD: Nexium 20 mg daily. Well controlled   COPD: Breo inhaler 1 once daily, duoneb as needed. Medication compliance and no side effects. Denies shortness of breath   High Cholesterol/CORONARY ARTERY DISEASE/CHF: taking crestor 20 mg daily, ASA 81. Hx of MI in 2011. Entresto 24-26 mg daily, Ranexa 500 mg TWICE DAILY, Lasix 20 mg TWICE DAILY, Coreg 3.125 TWICE DAILY. Dr Bing Matter. Has ECHO and Dr. Appointment with Dr. Bing Matter on Wednesday this week. States that her legs are still swollen.   Osteoporosis: Prolia 60 mg every 6 months.    DDD: Belbuca 900 mcg film every 12 hours, Oxycodone 15 mg THREE TIMES A DAY, Tizanidine 4 mg before bed, voltaren gel, gabapentin 800 mg twice a day. Needs elevated toilet due to difficulty standing up from standard height toilet.   Constipation: Amitiza 24 mcg twice daily daily and colace.  Fatigue: Patient reports feeling more fatigued in the last month. She reports that she has trouble doing her day to day activities.    Allergies taking flonase and xyzal.     06/02/2023    4:01 PM 04/07/2023    1:58 PM 12/31/2022    3:37 PM 10/10/2022    4:32 PM 03/20/2022   10:47 AM  Depression screen PHQ 2/9  Decreased Interest 2 0 1 3 0  Down, Depressed, Hopeless 2 1 3 3 1   PHQ - 2 Score 4 1 4 6 1   Altered sleeping 0 3 0 0 1  Tired, decreased energy 2 0 1 3 2   Change in appetite 3 3 1 3 2   Feeling bad or failure about yourself  1 2 0 0 2  Trouble  concentrating 0 0 1 0 1  Moving slowly or fidgety/restless 0 1 0 0 0  Suicidal thoughts 0 0 0 0 0  PHQ-9 Score 10 10 7 12 9   Difficult doing work/chores Somewhat difficult Very difficult Not difficult at all Not difficult at all Not difficult at all        06/02/2023    4:00 PM  Fall Risk   Falls in the past year? 0  Number falls in past yr: 0  Injury with Fall? 0  Risk for fall due to : Impaired balance/gait  Follow up Falls evaluation completed;Follow up appointment    Patient Care Team: Blane Ohara, MD as PCP - General (Family Medicine) Regan Lemming, MD as PCP - Electrophysiology (Cardiology) Georgeanna Lea, MD as PCP - Cardiology (Cardiology) Tressie Stalker, MD as Consulting Physician (Neurosurgery) Georgeanna Lea, MD as Consulting Physician (Cardiology) Karie Soda, MD as Consulting Physician (General Surgery) Drema Dallas, DO as Consulting Physician (Neurology) Earvin Hansen, [place] (Inactive) as Pharmacist (Pharmacist) Kerin Salen, MD as Consulting Physician (Gastroenterology) Zehr, Princella Pellegrini, PA-C as Physician Assistant (Gastroenterology) Anthony Sar, MD as Consulting Physician (Nephrology)   Review of Systems  Constitutional:  Positive for fatigue. Negative for chills  and fever.  HENT:  Negative for congestion, ear pain, rhinorrhea and sore throat.   Respiratory:  Negative for cough and shortness of breath.   Cardiovascular:  Negative for chest pain.  Gastrointestinal:  Negative for abdominal pain, constipation, diarrhea, nausea and vomiting.  Genitourinary:  Negative for dysuria and urgency.  Musculoskeletal:  Positive for back pain. Negative for myalgias.  Neurological:  Positive for dizziness. Negative for weakness, light-headedness and headaches.  Psychiatric/Behavioral:  Negative for dysphoric mood. The patient is not nervous/anxious.     Current Outpatient Medications on File Prior to Visit  Medication Sig Dispense Refill   ARIPiprazole  (ABILIFY) 5 MG tablet Take 1 tablet (5 mg total) by mouth daily. 30 tablet 2   aspirin 81 MG tablet Take 81 mg by mouth daily.     BREO ELLIPTA 100-25 MCG/ACT AEPB INHALE 1 PUFF INTO THE LUNGS DAILY. 60 each 10   Buprenorphine HCl (BELBUCA) 900 MCG FILM Place 900 mcg inside cheek every 12 (twelve) hours.     carvedilol (COREG) 3.125 MG tablet TAKE ONE (1) TABLET BY MOUTH TWICE DAILY WITH MEALS 60 tablet 5   denosumab (PROLIA) 60 MG/ML SOSY injection Inject 60 mg into the skin every 6 (six) months. 180 mL 2   diazepam (VALIUM) 2 MG tablet Take 1 tablet (2 mg total) by mouth every 6 (six) hours as needed for anxiety. 30 tablet 2   diclofenac Sodium (VOLTAREN) 1 % GEL Apply 2 g topically 4 (four) times daily. (Patient taking differently: Apply 2 g topically daily as needed (Pain).) 50 g 4   EPINEPHRINE 0.3 mg/0.3 mL IJ SOAJ injection Inject 0.3 mLs (0.3 mg total) into the muscle as needed for anaphylaxis. 1 each 2   esomeprazole (NEXIUM) 20 MG capsule TAKE ONE CAPSULE BY MOUTH ONCE DAILY 90 capsule 1   fluticasone (FLONASE) 50 MCG/ACT nasal spray Instill TWO SPRAYS into BOTH nostrils daily AS NEEDED FOR congestion 18 g 3   furosemide (LASIX) 20 MG tablet One in am and one at lunch 60 tablet 1   gabapentin (NEURONTIN) 800 MG tablet Take 1 tablet (800 mg total) by mouth 3 (three) times daily. Patient usually takes twice a day (Patient taking differently: Take 800 mg by mouth 3 (three) times daily.) 270 tablet 2   ipratropium-albuterol (DUONEB) 0.5-2.5 (3) MG/3ML SOLN Use 1 vial in nebulizer every 6 (six) hours as needed for shortness of breath 3 mL 11   levocetirizine (XYZAL) 5 MG tablet Take 5 mg by mouth daily as needed for allergies.     lubiprostone (AMITIZA) 24 MCG capsule Take 1 capsule (24 mcg total) by mouth 2 (two) times daily with a meal. 60 capsule 2   megestrol (MEGACE) 20 MG tablet Take 1 tablet (20 mg total) by mouth daily. 90 tablet 2   Multiple Vitamin (MULTIVITAMIN WITH MINERALS) TABS  tablet Take 1 tablet by mouth daily.     naloxone (NARCAN) nasal spray 4 mg/0.1 mL Place 1 spray into the nose once.     nitroGLYCERIN (NITROSTAT) 0.4 MG SL tablet Place 1 tablet (0.4 mg total) under the tongue every 5 (five) minutes as needed for chest pain. 25 tablet 6   ondansetron (ZOFRAN) 4 MG tablet Take 1 tablet (4 mg total) by mouth every 8 (eight) hours as needed for nausea or vomiting. 20 tablet 0   oxyCODONE (ROXICODONE) 15 MG immediate release tablet Take 15 mg by mouth 3 (three) times daily as needed for pain.  ranolazine (RANEXA) 500 MG 12 hr tablet Take 1 tablet (500 mg total) by mouth 2 (two) times daily. TAKE ONE (1) TABLET BY MOUTH TWICE DAILY 60 tablet 4   rosuvastatin (CRESTOR) 20 MG tablet TAKE ONE TABLET BY MOUTH ONCE DAILY 90 tablet 1   sacubitril-valsartan (ENTRESTO) 24-26 MG TAKE ONE (1) TABLET BY MOUTH TWICE DAILY 60 tablet 5   sertraline (ZOLOFT) 100 MG tablet Take 0.5 tablets (50 mg total) by mouth daily. 30 tablet 2   tiZANidine (ZANAFLEX) 4 MG tablet Take 4 mg by mouth at bedtime.     topiramate (TOPAMAX) 50 MG tablet TAKE TWO (2) TABLETS BY MOUTH EVERY DAY AT BEDTIME 60 tablet 5   Ubrogepant (UBRELVY) 100 MG TABS Take 1 tablet (100 mg total) by mouth as needed (May repeat after 2 hours.  Maximum 2 tablets in 24 hours). TAKE 1 TABLET BY MOUTH BY MOUTH AS NEEDED( MAY REPEAT 1 TABLET AFTER 2 HOURS IF NEEDED, MAXIMUM 2 TABLETS IN 24 HOURS) Strength: 100 mg 48 tablet 1   Galcanezumab-gnlm (EMGALITY) 120 MG/ML SOSY Inject 120 mg into the skin every 30 (thirty) days. (Patient not taking: Reported on 06/23/2023) 3 mL 0   No current facility-administered medications on file prior to visit.   Past Medical History:  Diagnosis Date   Abdominal pain 10/17/2020   Abnormality of gait due to impairment of balance 11/22/2020   Acute diverticulitis 10/17/2020   Admission for long-term opiate analgesic use 10/24/2019   Arthritis of right hip 05/29/2016   Formatting of this note  might be different from the original. Added automatically from request for surgery [id number]   BMI 26.0-26.9,adult 03/29/2020   Bronchial asthma    Cardiomyopathy (HCC)    Overview:  Ejection fraction 45% in 2015 Ejection fraction 30 to 35% in November 2018   Chronic back pain    Chronic constipation 07/31/2021   Chronic hypoxemic respiratory failure (HCC) 10/24/2019   Chronic narcotic use 03/23/2015   Chronic pain of both knees 12/21/2018   Added automatically from request for surgery [id number]  Formatting of this note might be different from the original. 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   Hypotension 05/17/2020   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   Ileus following gastrointestinal surgery (HCC) 03/26/2015   Major depressive disorder, single episode, moderate (HCC) 10/24/2019   Malnutrition of moderate degree (HCC) 10/20/2020   Mesenteric ischemia (HCC) 10/16/2020   Migraine without aura with status migrainosus 10/24/2019   Mixed incontinence 10/20/2020   Myocardial infarction [facility])    Nausea & vomiting    Obstructive chronic bronchitis with exacerbation (HCC) 10/25/2019   Other spondylosis with radiculopathy, lumbar  region 10/24/2019   Persistent vomiting 05/17/2020   Presence of left artificial hip joint 10/24/2019   PTSD (post-traumatic stress disorder)    Recurrent  incisional hernias with incarceration s/p lap repair w mesh 03/23/2015 04/19/2014   Senile osteoporosis 10/24/2019   Sepsis (HCC) 10/17/2020   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 Small internal hemrrhoids. Otherwise normal colonoscopy   COLONOSCOPY WITH PROPOFOL N/A 09/27/2021   Procedure: COLONOSCOPY WITH PROPOFOL;  Surgeon: Napoleon Form, MD;  Location: [facility] ENDOSCOPY;  Service: Endoscopy;  Laterality: N/A;   CORONARY ANGIOPLASTY     ESOPHAGOGASTRODUODENOSCOPY  02/04/2014   Mild gastritis. Otherwise normal EGD   HAND SURGERY Bilateral    ICD GENERATOR CHANGEOUT N/A 05/21/2019   Procedure: ICD GENERATOR CHANGEOUT;  Surgeon: Regan Lemming, MD;  Location[facility] INVASIVE CV LAB;  Service: Cardiovascular;  Laterality: N/A;   ICD IMPLANT     Medtronic   intestinal blockage 2011     LAPAROSCOPIC ASSISTED VENTRAL HERNIA REPAIR N/A 03/23/2015   Procedure: LAPAROSCOPIC VENTRAL WALL HERNIA REPAIR;  Surgeon: Karie Soda, MD;  Location: [facility] ORS;  Service: General;  Laterality: N/A;  With MESH   LAPAROSCOPIC LYSIS OF ADHESIONS N/A 03/23/2015   Procedure: LAPAROSCOPIC LYSIS OF ADHESIONS;  Surgeon: Karie Soda, MD;  Location: [facility] ORS;  Service: General;  Laterality: N/A;   LSCS      x2   NASAL SEPTUM SURGERY     NECK SURGERY     fused   TONSILLECTOMY     TYMPANOSTOMY TUBE  PLACEMENT Right 2024   ULNAR NERVE TRANSPOSITION  01/23/2012   Procedure: ULNAR NERVE DECOMPRESSION/TRANSPOSITION;  Surgeon: Cristi Loron, MD;  Location: [facility] NEURO ORS;  Service: Neurosurgery;  Laterality: Left;  LEFT ulnar nerve decompression    Family History  Problem Relation Age of Onset   Hypertension Mother    Heart attack Mother    Alcohol abuse Father    Hypertension Father    Heart attack Father    Heart attack Other    Cancer Other    Heart failure Other    Anesthesia problems Neg Hx    Hypotension Neg Hx    Malignant hyperthermia Neg Hx    Pseudochol deficiency Neg Hx    Breast cancer Neg Hx    Social History   Socioeconomic History   Marital status: Single    Spouse name: Not on file   Number of children: 3   Years of education: Not on file   Highest education level: 10th grade  Occupational History   Occupation: disabled  Tobacco Use   Smoking status: Former    Current packs/day: 0.00    Average packs/day: 0.5 packs/day for 30.0 years (15.0 ttl pk-yrs)    Types: Cigarettes    Start date: 03/1990    Quit date: 03/2020    Years since quitting: 3.2   Smokeless tobacco: Never  Vaping Use   Vaping status: Never Used  Substance and Sexual Activity   Alcohol use: Yes    Alcohol/week: 2.0 standard drinks of alcohol    Types: 1 Cans of beer, 1 Standard drinks or equivalent  per week    Comment: seldom   Drug use: No   Sexual activity: Not Currently  Other Topics Concern   Not on file  Social History Narrative   One level home with boyfriend   Caffeine - coffee 1-2 cups/day; Green tea 4-5 bottles a day   Exercise - some    Right handed      Social Determinants of Health   Financial Resource Strain: Low Risk  (04/05/2023)   Overall Financial Resource Strain (CARDIA)    Difficulty of Paying Living Expenses: Not very hard  Food Insecurity: No Food Insecurity (04/05/2023)   Hunger Vital Sign    Worried About Running Out of Food in the Last Year: Never  true    Ran Out of Food in the Last Year: Never true  Transportation Needs: Unmet Transportation Needs (04/05/2023)   PRAPARE - Administrator, Civil Service (Medical): Yes    Lack of Transportation (Non-Medical): No  Physical Activity: Insufficiently Active (04/05/2023)   Exercise Vital Sign    Days of Exercise per Week: 3 days    Minutes of Exercise per Session: 20 min  Stress: Stress Concern Present (04/05/2023)   [employer] of Occupational Health - Occupational Stress Questionnaire    Feeling of Stress : Very much  Social Connections: Moderately Isolated (04/05/2023)   Social Connection and Isolation Panel [NHANES]    Frequency of Communication with Friends and Family: More than three times a week    Frequency of Social Gatherings with Friends and Family: More than three times a week    Attends Religious Services: 1 to 4 times per year    Active Member of [employer] or Organizations: No    Attends Engineer, structural: Never    Marital Status: Divorced    Objective:  Pulse 89   Temp (!) 97.1 F (36.2 C)   Ht 5' 1.5" (1.562 m)   Wt 160 lb (72.6 kg)   SpO2 97%   BMI 29.74 kg/m      06/23/2023    2:35 PM 06/02/2023    3:57 PM 04/07/2023    1:57 PM  BP/Weight  Systolic BP  118 696  Diastolic BP  68 70  Wt. (Lbs) 160 157 152  BMI 29.74 kg/m2 30.66 kg/m2 29.69 kg/m2    Physical Exam Vitals reviewed.  Constitutional:      Appearance: Normal appearance. She is normal weight.  HENT:     Right Ear: Hearing and external ear normal. A PE tube is present.     Left Ear: Hearing and external ear normal. A PE tube is present.     Mouth/Throat:     Mouth: Mucous membranes are moist.     Pharynx: No posterior oropharyngeal erythema.  Cardiovascular:     Rate and Rhythm: Normal rate and regular rhythm.     Heart sounds: Normal heart sounds.  Pulmonary:     Effort: Pulmonary effort is normal. No respiratory distress.     Breath sounds: Normal breath sounds.   Abdominal:     General: Abdomen is flat. Bowel sounds are normal.     Palpations: Abdomen is soft.     Tenderness: There is no abdominal tenderness.  Skin:    General: Skin is warm.  Neurological:     Mental Status: She is alert and oriented to person, place, and time.  Psychiatric:        Mood and Affect: Mood normal.  Behavior: Behavior normal.     Lab Results  Component Value Date   WBC 7.5 06/02/2023   HGB 10.7 (L) 06/02/2023   HCT 34.2 06/02/2023   PLT 321 06/02/2023   GLUCOSE 110 (H) 06/02/2023   CHOL 163 12/31/2022   TRIG 146 12/31/2022   HDL 46 12/31/2022   LDLCALC 91 12/31/2022   ALT 4 06/02/2023   AST 12 06/02/2023   NA 142 06/02/2023   K 4.7 06/02/2023   CL 110 (H) 06/02/2023   CREATININE 1.75 (H) 06/02/2023   BUN 19 06/02/2023   CO2 16 (L) 06/02/2023   TSH 1.030 06/02/2023   INR 1.0 10/18/2020      Assessment & Plan:    Chronic systolic congestive heart failure, NYHA class 2 (HCC) Assessment & Plan: Labs drawn Appointment on 06/25/23 with Dr. Bing Matter and also to get an ECHO Await labs/testing for assessment and recommendations.   Orders: -     Comprehensive metabolic panel -     CBC with Differential/Platelet  Bilateral lower extremity edema Assessment & Plan: Not improving. Labs from today. Will await results for further recommendation.  Orders: -     Comprehensive metabolic panel -     CBC with Differential/Platelet  Depression, major, recurrent, moderate (HCC) Assessment & Plan: Well-controlled Change Abilify to 5 mg before bed continue Zoloft at 100 mg daily.  Plan to discontinue Valium until the new dose is gone. Recommend melatonin 5 mg before bed   Gastroesophageal reflux disease with esophagitis, unspecified whether hemorrhage Assessment & Plan: The current medical regimen is effective;   please continue present plan and medications. Nexium 20 mg daily.    Mixed hyperlipidemia Assessment &  Plan: Well-controlled. Labs drawn today. No changes to medication. Continue Crestor 20 mg before bed. Continue to work on eating a healthy diet and exercising.  Orders: -     Lipid panel  Chronic obstructive pulmonary disease, unspecified COPD type (HCC) Assessment & Plan: Well-controlled.   Continue current medical regimen as prescribed (Breo and DuoNeb)   Elevated serum glucose Assessment & Plan: Last glucose was elevated at 110.  Patient has no history of diabetes. Labs drawn today. Await labs/testing for assessment and recommendations.   Orders: -     Hemoglobin A1c  Fatigue, unspecified type Assessment & Plan: Acute Labs drawn Await results for assessment and recommendation.  Orders: -     VITAMIN D 25 Hydroxy (Vit-D Deficiency, Fractures)  Normocytic anemia Assessment & Plan: Labs drawn today  hemoglobin at last appointment was 10.7 Await labs/testing for assessment and recommendations.   Orders: -     Iron, TIBC and Ferritin Panel -     B12 and Folate Panel  Migraine without aura and without status migrainosus, not intractable Assessment & Plan: Well-controlled Ubrelvy 100 mg daily. Continue Emgality monthly and Topamax 100 mg daily.       No orders of the defined types were placed in this encounter.   Orders Placed This Encounter  Procedures   Lipid Panel   Hemoglobin A1c   Vitamin D, 25-hydroxy   Comprehensive metabolic panel   CBC with Differential   Iron, TIBC and Ferritin Panel   B12 and Folate Panel     Follow-up: Return in about 3 months (around 09/23/2023) for chronic.   I,Katherina A Bramblett,acting as a scribe for [name], FNP.,have documented all relevant documentation on the behalf of [name], FNP,as directed by  [name], FNP while in the presence  of [name], FNP.   An After Visit Summary was printed and given to the patient.  Total time spent on today's visit was greater than 30 minutes, including  both face-to-face time and nonface-to-face time personally spent on review of chart (labs and imaging), discussing labs and goals, discussing further work-up, treatment options, referrals to specialist if needed, reviewing outside records of pertinent, answering patient's questions, and coordinating care.   Lajuana Matte, FNP [facility] [phone]

## 2023-06-23 NOTE — Assessment & Plan Note (Signed)
Labs drawn today  hemoglobin at last appointment was 10.7 Await labs/testing for assessment and recommendations.

## 2023-06-23 NOTE — Assessment & Plan Note (Signed)
Well-controlled Change Abilify to 5 mg before bed continue Zoloft at 100 mg daily.  Plan to discontinue Valium until the new dose is gone. Recommend melatonin 5 mg before bed

## 2023-06-23 NOTE — Assessment & Plan Note (Signed)
Labs drawn Appointment on 06/25/23 with Dr. Bing Matter and also to get an ECHO Await labs/testing for assessment and recommendations.

## 2023-06-23 NOTE — Assessment & Plan Note (Signed)
Well-controlled.   Continue current medical regimen as prescribed (Breo and DuoNeb)

## 2023-06-23 NOTE — Assessment & Plan Note (Signed)
Well-controlled Ubrelvy 100 mg daily. Continue Emgality monthly and Topamax 100 mg daily.

## 2023-06-23 NOTE — Assessment & Plan Note (Signed)
The current medical regimen is effective;   please continue present plan and medications. Nexium 20 mg daily.

## 2023-06-24 ENCOUNTER — Other Ambulatory Visit: Payer: Self-pay | Admitting: Family Medicine

## 2023-06-24 DIAGNOSIS — E782 Mixed hyperlipidemia: Secondary | ICD-10-CM

## 2023-06-24 DIAGNOSIS — E559 Vitamin D deficiency, unspecified: Secondary | ICD-10-CM

## 2023-06-24 LAB — LIPID PANEL
Chol/HDL Ratio: 3.3 ratio (ref 0.0–4.4)
Cholesterol, Total: 223 mg/dL — ABNORMAL HIGH (ref 100–199)
HDL: 67 mg/dL (ref >39)
LDL Chol Calc (NIH): 128 mg/dL — ABNORMAL HIGH (ref 0–99)
Triglycerides: 161 mg/dL — ABNORMAL HIGH (ref 0–149)
VLDL Cholesterol Cal: 28 mg/dL (ref 5–40)

## 2023-06-24 LAB — B12 AND FOLATE PANEL
Folate: 11 ng/mL (ref >3.0)
Vitamin B-12: 312 pg/mL (ref 232–1245)

## 2023-06-24 LAB — CBC WITH DIFFERENTIAL/PLATELET
Basophils Absolute: 0.1 10*3/uL (ref 0.0–0.2)
Basos: 1 %
EOS (ABSOLUTE): 0.3 10*3/uL (ref 0.0–0.4)
Eos: 3 %
Hematocrit: 33.1 % — ABNORMAL LOW (ref 34.0–46.6)
Hemoglobin: 10.5 g/dL — ABNORMAL LOW (ref 11.1–15.9)
Immature Grans (Abs): 0 10*3/uL (ref 0.0–0.1)
Immature Granulocytes: 0 %
Lymphocytes Absolute: 2 10*3/uL (ref 0.7–3.1)
Lymphs: 23 %
MCH: 29.4 pg (ref 26.6–33.0)
MCHC: 31.7 g/dL (ref 31.5–35.7)
MCV: 93 fL (ref 79–97)
Monocytes Absolute: 0.6 10*3/uL (ref 0.1–0.9)
Monocytes: 6 %
Neutrophils Absolute: 5.7 10*3/uL (ref 1.4–7.0)
Neutrophils: 67 %
Platelets: 228 10*3/uL (ref 150–450)
RBC: 3.57 x10E6/uL — ABNORMAL LOW (ref 3.77–5.28)
RDW: 11.8 % (ref 11.7–15.4)
WBC: 8.5 10*3/uL (ref 3.4–10.8)

## 2023-06-24 LAB — IRON,TIBC AND FERRITIN PANEL
Ferritin: 21 ng/mL (ref 15–150)
Iron Saturation: 30 % (ref 15–55)
Iron: 109 ug/dL (ref 27–139)
Total Iron Binding Capacity: 362 ug/dL (ref 250–450)
UIBC: 253 ug/dL (ref 118–369)

## 2023-06-24 LAB — COMPREHENSIVE METABOLIC PANEL
ALT: 5 [IU]/L (ref 0–32)
AST: 13 [IU]/L (ref 0–40)
Albumin: 4.2 g/dL (ref 3.9–4.9)
Alkaline Phosphatase: 63 [IU]/L (ref 44–121)
BUN/Creatinine Ratio: 11 — ABNORMAL LOW (ref 12–28)
BUN: 20 mg/dL (ref 8–27)
Bilirubin Total: 0.3 mg/dL (ref 0.0–1.2)
CO2: 20 mmol/L (ref 20–29)
Calcium: 9.7 mg/dL (ref 8.7–10.3)
Chloride: 109 mmol/L — ABNORMAL HIGH (ref 96–106)
Creatinine, Ser: 1.76 mg/dL — ABNORMAL HIGH (ref 0.57–1.00)
Globulin, Total: 2.4 g/dL (ref 1.5–4.5)
Glucose: 88 mg/dL (ref 70–99)
Potassium: 5.2 mmol/L (ref 3.5–5.2)
Sodium: 142 mmol/L (ref 134–144)
Total Protein: 6.6 g/dL (ref 6.0–8.5)
eGFR: 31 mL/min/{1.73_m2} — ABNORMAL LOW (ref >59)

## 2023-06-24 LAB — HEMOGLOBIN A1C
Est. average glucose Bld gHb Est-mCnc: 111 mg/dL
Hgb A1c MFr Bld: 5.5 % (ref 4.8–5.6)

## 2023-06-24 LAB — VITAMIN D 25 HYDROXY (VIT D DEFICIENCY, FRACTURES): Vit D, 25-Hydroxy: 24.8 ng/mL — ABNORMAL LOW (ref 30.0–100.0)

## 2023-06-24 MED ORDER — ROSUVASTATIN CALCIUM 40 MG PO TABS: 40.0000 mg | ORAL_TABLET | Freq: Every day | ORAL | 3 refills | Status: DC

## 2023-06-24 MED ORDER — VITAMIN D (ERGOCALCIFEROL) 1.25 MG (50000 UNIT) PO CAPS: 50000.0000 [IU] | ORAL_CAPSULE | ORAL | 0 refills | Status: DC

## 2023-06-24 NOTE — Progress Notes (Signed)
Remote ICD transmission.   

## 2023-06-25 ENCOUNTER — Encounter: Payer: Self-pay | Admitting: Cardiology

## 2023-06-25 ENCOUNTER — Ambulatory Visit (INDEPENDENT_AMBULATORY_CARE_PROVIDER_SITE_OTHER): Payer: 59

## 2023-06-25 ENCOUNTER — Ambulatory Visit: Payer: 59 | Attending: Cardiology | Admitting: Cardiology

## 2023-06-25 VITALS — BP 138/84 | HR 65 | Ht 60.0 in | Wt 160.6 lb

## 2023-06-25 DIAGNOSIS — Z95810 Presence of automatic (implantable) cardiac defibrillator: Secondary | ICD-10-CM | POA: Diagnosis not present

## 2023-06-25 DIAGNOSIS — I2510 Atherosclerotic heart disease of native coronary artery without angina pectoris: Secondary | ICD-10-CM | POA: Diagnosis not present

## 2023-06-25 DIAGNOSIS — I420 Dilated cardiomyopathy: Secondary | ICD-10-CM

## 2023-06-25 DIAGNOSIS — I5022 Chronic systolic (congestive) heart failure: Secondary | ICD-10-CM | POA: Diagnosis not present

## 2023-06-25 DIAGNOSIS — R0609 Other forms of dyspnea: Secondary | ICD-10-CM

## 2023-06-25 LAB — ECHOCARDIOGRAM COMPLETE
Calc EF: 36.9 %
MV M vel: 4.54 m/s
MV Peak grad: 82.4 mmHg
Radius: 0.7 cm
S' Lateral: 4.5 cm
Single Plane A2C EF: 40.1 %
Single Plane A4C EF: 40.3 %

## 2023-06-25 MED ORDER — FUROSEMIDE 40 MG PO TABS: 40.0000 mg | ORAL_TABLET | Freq: Every day | ORAL | 3 refills | Status: DC

## 2023-06-25 NOTE — Patient Instructions (Addendum)
Medication Instructions:   START: Lasix 40mg  1 tablet daily   Lab Work: Your physician recommends that you return for lab work in: 1 week You need to have labs done when you are fasting.  You can come Monday through Friday 8:30 am to 12:00 pm and 1:15 to 4:30. You do not need to make an appointment as the order has already been placed. The labs you are going to have done are BMP, ProBNP   Testing/Procedures: None Ordered   Follow-Up: At [facility], you and your health needs are our priority.  As part of our continuing mission to provide you with exceptional heart care, we have created designated Provider Care Teams.  These Care Teams include your primary Cardiologist (physician) and Advanced Practice Providers (APPs -  Physician Assistants and Nurse Practitioners) who all work together to provide you with the care you need, when you need it.  We recommend signing up for the patient portal called "MyChart".  Sign up information is provided on this After Visit Summary.  MyChart is used to connect with patients for Virtual Visits (Telemedicine).  Patients are able to view lab/test results, encounter notes, upcoming appointments, etc.  Non-urgent messages can be sent to your provider as well.   To learn more about what you can do with MyChart, go to [email or web address].    Your next appointment:   1 month(s)  The format for your next appointment:   In Person  Provider:   Gypsy Balsam, MD    Other Instructions NA

## 2023-06-25 NOTE — Progress Notes (Unsigned)
Cardiology Office Note:    Date:  06/25/2023   ID:  [name], DOB November 19, 1955, MRN [id number]  PCP:  Blane Ohara, MD  Cardiologist:  Gypsy Balsam, MD    Referring MD: Blane Ohara, MD   Chief Complaint  Patient presents with   Shortness of Breath        Weight Gain        Foot Swelling         History of Present Illness:    [name] is a 67 y.o. 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  Comes today to months for follow-up.  She complained because she gained about 20 pounds.  She also noticed some swelling of lower extremities at evening time.  She also got much more easily short of breath while walking.  Denies having chest pain tightness squeezing pressure burning chest, no paroxysmal nocturnal dyspnea.  Past Medical History:  Diagnosis Date   Abdominal pain 10/17/2020   Abnormality of gait due to impairment of balance 11/22/2020   Acute diverticulitis 10/17/2020   Admission for long-term opiate analgesic use 10/24/2019   Arthritis of right hip 05/29/2016   Formatting of this note might be different from the original. Added automatically from request for surgery [id number]   BMI 26.0-26.9,adult 03/29/2020   Bronchial asthma    Cardiomyopathy (HCC)    Overview:  Ejection fraction 45% in 2015 Ejection fraction 30 to 35% in November 2018   Chronic back pain    Chronic constipation 07/31/2021   Chronic hypoxemic respiratory failure (HCC) 10/24/2019   Chronic narcotic use 03/23/2015   Chronic pain of both knees 12/21/2018   Added automatically from request for surgery [id number]  Formatting of this note might be different from the original. 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   Hypotension 05/17/2020   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   Ileus following gastrointestinal surgery (HCC) 03/26/2015   Major depressive disorder, single episode, moderate (HCC) 10/24/2019   Malnutrition of moderate degree (HCC) 10/20/2020   Mesenteric ischemia (HCC) 10/16/2020   Migraine without aura with status migrainosus 10/24/2019   Mixed incontinence 10/20/2020   Myocardial infarction (HCC)    Nausea & vomiting    Obstructive chronic bronchitis with exacerbation (HCC) 10/25/2019   Other spondylosis with radiculopathy, lumbar region 10/24/2019   Persistent vomiting 05/17/2020   Presence of left artificial hip joint 10/24/2019   PTSD (post-traumatic stress disorder)    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 03/23/2015 04/19/2014   Senile osteoporosis 10/24/2019   Sepsis (  HCC) 10/17/2020   Serosanguineous chronic otitis media of right ear 08/02/2021   Small bowel obstruction (HCC)    Thyroid disease    Upper respiratory tract infection due to COVID-19 virus 07/06/2021   Wellness examination 03/27/2021    Past Surgical History:  Procedure Laterality  Date   APPENDECTOMY     CARDIAC CATHETERIZATION     CARPAL TUNNEL RELEASE     CESAREAN SECTION     CHF s/p AICD   12/2010   CHOLECYSTECTOMY     COLONOSCOPY  10/16/2015   Mild colonic diverticulosis, predominantly in the left colon. Small internal hemrrhoids. Otherwise normal colonoscopy   COLONOSCOPY WITH PROPOFOL N/A 09/27/2021   Procedure: COLONOSCOPY WITH PROPOFOL;  Surgeon: Napoleon Form, MD;  Location: [facility];  Service: Endoscopy;  Laterality: N/A;   CORONARY ANGIOPLASTY     ESOPHAGOGASTRODUODENOSCOPY  02/04/2014   Mild gastritis. Otherwise normal EGD   HAND SURGERY Bilateral    ICD GENERATOR CHANGEOUT N/A 05/21/2019   Procedure: ICD GENERATOR CHANGEOUT;  Surgeon: Regan Lemming, MD;  Location: [facility] INVASIVE CV LAB;  Service: Cardiovascular;  Laterality: N/A;   ICD IMPLANT     Medtronic   intestinal blockage 2011     LAPAROSCOPIC ASSISTED VENTRAL HERNIA REPAIR N/A 03/23/2015   Procedure: LAPAROSCOPIC VENTRAL WALL HERNIA REPAIR;  Surgeon: Karie Soda, MD;  Location: [facility];  Service: General;  Laterality: N/A;  With MESH   LAPAROSCOPIC LYSIS OF ADHESIONS N/A 03/23/2015   Procedure: LAPAROSCOPIC LYSIS OF ADHESIONS;  Surgeon: Karie Soda, MD;  Location: [facility];  Service: General;  Laterality: N/A;   LSCS      x2   NASAL SEPTUM SURGERY     NECK SURGERY     fused   TONSILLECTOMY     TYMPANOSTOMY TUBE PLACEMENT Right 2024   ULNAR NERVE TRANSPOSITION  01/23/2012   Procedure: ULNAR NERVE DECOMPRESSION/TRANSPOSITION;  Surgeon: Cristi Loron, MD;  Location: [facility];  Service: Neurosurgery;  Laterality: Left;  LEFT ulnar nerve decompression    Current Medications: Current Meds  Medication Sig   ARIPiprazole (ABILIFY) 5 MG tablet Take 1 tablet (5 mg total) by mouth daily.   aspirin 81 MG tablet Take 81 mg by mouth daily.   BREO ELLIPTA 100-25 MCG/ACT AEPB INHALE 1 PUFF INTO THE LUNGS DAILY.   Buprenorphine HCl (BELBUCA) 900 MCG FILM Place 900 mcg inside  cheek every 12 (twelve) hours.   carvedilol (COREG) 3.125 MG tablet TAKE ONE (1) TABLET BY MOUTH TWICE DAILY WITH MEALS (Patient taking differently: Take 3.125 mg by mouth 2 (two) times daily with a meal.)   denosumab (PROLIA) 60 MG/ML SOSY injection Inject 60 mg into the skin every 6 (six) months.   diclofenac Sodium (VOLTAREN) 1 % GEL Apply 2 g topically 4 (four) times daily. (Patient taking differently: Apply 2 g topically daily as needed (Pain).)   EPINEPHRINE 0.3 mg/0.3 mL IJ SOAJ injection Inject 0.3 mLs (0.3 mg total) into the muscle as needed for anaphylaxis.   esomeprazole (NEXIUM) 20 MG capsule TAKE ONE CAPSULE BY MOUTH ONCE DAILY   fluticasone (FLONASE) 50 MCG/ACT nasal spray Instill TWO SPRAYS into BOTH nostrils daily AS NEEDED FOR congestion (Patient taking differently: Place 1 spray into both nostrils daily. Instill TWO SPRAYS into BOTH nostrils daily AS NEEDED FOR congestion)   furosemide (LASIX) 20 MG tablet One in am and one at lunch (Patient taking differently: Take 20 mg by mouth daily with lunch. One in am and one at  lunch)   gabapentin (NEURONTIN) 800 MG tablet Take 1 tablet (800 mg total) by mouth 3 (three) times daily. Patient usually takes twice a day (Patient taking differently: Take 800 mg by mouth 3 (three) times daily.)   Galcanezumab-gnlm (EMGALITY) 120 MG/ML SOSY Inject 120 mg into the skin every 30 (thirty) days.   ipratropium-albuterol (DUONEB) 0.5-2.5 (3) MG/3ML SOLN Use 1 vial in nebulizer every 6 (six) hours as needed for shortness of breath (Patient taking differently: Take 3 mLs by nebulization every 6 (six) hours as needed (SOb).)   levocetirizine (XYZAL) 5 MG tablet Take 5 mg by mouth daily as needed for allergies.   lubiprostone (AMITIZA) 24 MCG capsule Take 1 capsule (24 mcg total) by mouth 2 (two) times daily with a meal.   megestrol (MEGACE) 20 MG tablet Take 1 tablet (20 mg total) by mouth daily.   Multiple Vitamin (MULTIVITAMIN WITH MINERALS) TABS tablet  Take 1 tablet by mouth daily.   naloxone (NARCAN) nasal spray 4 mg/0.1 mL Place 1 spray into the nose once.   nitroGLYCERIN (NITROSTAT) 0.4 MG SL tablet Place 1 tablet (0.4 mg total) under the tongue every 5 (five) minutes as needed for chest pain.   ondansetron (ZOFRAN) 4 MG tablet Take 1 tablet (4 mg total) by mouth every 8 (eight) hours as needed for nausea or vomiting.   oxyCODONE (ROXICODONE) 15 MG immediate release tablet Take 15 mg by mouth 3 (three) times daily as needed for pain.   ranolazine (RANEXA) 500 MG 12 hr tablet Take 1 tablet (500 mg total) by mouth 2 (two) times daily. TAKE ONE (1) TABLET BY MOUTH TWICE DAILY   rosuvastatin (CRESTOR) 20 MG tablet TAKE ONE TABLET BY MOUTH ONCE DAILY   rosuvastatin (CRESTOR) 40 MG tablet Take 1 tablet (40 mg total) by mouth daily.   sacubitril-valsartan (ENTRESTO) 24-26 MG TAKE ONE (1) TABLET BY MOUTH TWICE DAILY (Patient taking differently: Take 1 tablet by mouth 2 (two) times daily.)   sertraline (ZOLOFT) 100 MG tablet Take 0.5 tablets (50 mg total) by mouth daily.   tiZANidine (ZANAFLEX) 4 MG tablet Take 4 mg by mouth at bedtime.   topiramate (TOPAMAX) 50 MG tablet TAKE TWO (2) TABLETS BY MOUTH EVERY DAY AT BEDTIME (Patient taking differently: Take 50 mg by mouth at bedtime. TAKE TWO (2) TABLETS BY MOUTH EVERY DAY AT BEDTIME)   Ubrogepant (UBRELVY) 100 MG TABS Take 1 tablet (100 mg total) by mouth as needed (May repeat after 2 hours.  Maximum 2 tablets in 24 hours). TAKE 1 TABLET BY MOUTH BY MOUTH AS NEEDED( MAY REPEAT 1 TABLET AFTER 2 HOURS IF NEEDED, MAXIMUM 2 TABLETS IN 24 HOURS) Strength: 100 mg   Vitamin D, Ergocalciferol, (DRISDOL) 1.25 MG (50000 UNIT) CAPS capsule Take 1 capsule (50,000 Units total) by mouth every 7 (seven) days for 12 doses.   [DISCONTINUED] diazepam (VALIUM) 2 MG tablet Take 1 tablet (2 mg total) by mouth every 6 (six) hours as needed for anxiety.     Allergies:   Patient has no known allergies.   Social History    Socioeconomic History   Marital status: Single    Spouse name: Not on file   Number of children: 3   Years of education: Not on file   Highest education level: 10th grade  Occupational History   Occupation: disabled  Tobacco Use   Smoking status: Former    Current packs/day: 0.00    Average packs/day: 0.5 packs/day for 30.0 years (15.0 ttl  pk-yrs)    Types: Cigarettes    Start date: 03/1990    Quit date: 03/2020    Years since quitting: 3.2   Smokeless tobacco: Never  Vaping Use   Vaping status: Never Used  Substance and Sexual Activity   Alcohol use: Yes    Alcohol/week: 2.0 standard drinks of alcohol    Types: 1 Cans of beer, 1 Standard drinks or equivalent per week    Comment: seldom   Drug use: No   Sexual activity: Not Currently  Other Topics Concern   Not on file  Social History Narrative   One level home with boyfriend   Caffeine - coffee 1-2 cups/day; Green tea 4-5 bottles a day   Exercise - some    Right handed      Social Determinants of Health   Financial Resource Strain: Low Risk  (04/05/2023)   Overall Financial Resource Strain (CARDIA)    Difficulty of Paying Living Expenses: Not very hard  Food Insecurity: No Food Insecurity (04/05/2023)   Hunger Vital Sign    Worried About Running Out of Food in the Last Year: Never true    Ran Out of Food in the Last Year: Never true  Transportation Needs: Unmet Transportation Needs (04/05/2023)   PRAPARE - Administrator, Civil Service (Medical): Yes    Lack of Transportation (Non-Medical): No  Physical Activity: Insufficiently Active (04/05/2023)   Exercise Vital Sign    Days of Exercise per Week: 3 days    Minutes of Exercise per Session: 20 min  Stress: Stress Concern Present (04/05/2023)   [employer] of Occupational Health - Occupational Stress Questionnaire    Feeling of Stress : Very much  Social Connections: Moderately Isolated (04/05/2023)   Social Connection and Isolation Panel  [NHANES]    Frequency of Communication with Friends and Family: More than three times a week    Frequency of Social Gatherings with Friends and Family: More than three times a week    Attends Religious Services: 1 to 4 times per year    Active Member of [employer] or Organizations: No    Attends Engineer, structural: Never    Marital Status: Divorced     Family History: The patient's family history includes Alcohol abuse in her father; Cancer in an other family member; Heart attack in her father, mother, and another family member; Heart failure in an other family member; Hypertension in her father and mother. There is no history of Anesthesia problems, Hypotension, Malignant hyperthermia, Pseudochol deficiency, or Breast cancer. ROS:   Please see the history of present illness.    All 14 point review of systems negative except as described per history of present illness  EKGs/Labs/Other Studies Reviewed:         Recent Labs: 06/02/2023: TSH 1.030 06/23/2023: ALT 5; BUN 20; Creatinine, Ser 1.76; Hemoglobin 10.5; Platelets 228; Potassium 5.2; Sodium 142  Recent Lipid Panel    Component Value Date/Time   CHOL 223 (H) 06/23/2023 1516   TRIG 161 (H) 06/23/2023 1516   HDL 67 06/23/2023 1516   CHOLHDL 3.3 06/23/2023 1516   LDLCALC 128 (H) 06/23/2023 1516    Physical Exam:    VS:  BP 138/84 (BP Location: Left Arm, Patient Position: Sitting)   Pulse 65   Ht 5' (1.524 m)   Wt 160 lb 9.6 oz (72.8 kg)   SpO2 95%   BMI 31.37 kg/m     Wt Readings from Last 3 Encounters:  06/25/23 160 lb 9.6 oz (72.8 kg)  06/23/23 160 lb (72.6 kg)  06/02/23 157 lb (71.2 kg)     GEN:  Well nourished, well developed in no acute distress HEENT: Normal NECK: No JVD; No carotid bruits LYMPHATICS: No lymphadenopathy CARDIAC: RRR, no murmurs, no rubs, no gallops RESPIRATORY:  Clear to auscultation without rales, wheezing or rhonchi  ABDOMEN: Soft, non-tender, non-distended MUSCULOSKELETAL: 1+  swelling of lower extremities SKIN: Warm and dry LOWER EXTREMITIES: 1+ following NEUROLOGIC:  Alert and oriented x 3 PSYCHIATRIC:  Normal affect   ASSESSMENT:    1. Chronic systolic congestive heart failure, NYHA class 2 (HCC)   2. Coronary artery disease involving native coronary artery of native heart without angina pectoris   3. 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  Echocardiogram today showed preserved ejection fraction, moderate mitral regurgitation.  Her physical exam revealed mild decompensation.  She does have some swelling of lower extremities.  Lungs are clear.  I will put her on 40 mg of Lasix check her Chem-7 next week.  Concerns about her kidney dysfunction which office need to watch very carefully. Cardiomyopathy: Ejection fraction improved but there is moderate degree of mitral regurgitation which could be responsible for decompensation. ICD present I did review her interrogation of the device normal function I did review OptiVol which is still normal without evidence of fluid increase.   Medication Adjustments/Labs and Tests Ordered: Current medicines are reviewed at length with the patient today.  Concerns regarding medicines are outlined above.  No orders of the defined types were placed in this encounter.  Medication changes: No orders of the defined types were placed in this encounter.   Signed, Georgeanna Lea, MD, [facility] 06/25/2023 4:31 PM    [facility]

## 2023-06-29 ENCOUNTER — Other Ambulatory Visit: Payer: Self-pay | Admitting: Family Medicine

## 2023-06-29 DIAGNOSIS — E559 Vitamin D deficiency, unspecified: Secondary | ICD-10-CM

## 2023-07-06 ENCOUNTER — Other Ambulatory Visit: Payer: Self-pay | Admitting: Family Medicine

## 2023-07-06 DIAGNOSIS — K5903 Drug induced constipation: Secondary | ICD-10-CM

## 2023-07-13 DIAGNOSIS — J449 Chronic obstructive pulmonary disease, unspecified: Secondary | ICD-10-CM | POA: Diagnosis not present

## 2023-07-14 ENCOUNTER — Other Ambulatory Visit: Payer: Self-pay | Admitting: Family Medicine

## 2023-07-14 DIAGNOSIS — G8929 Other chronic pain: Secondary | ICD-10-CM | POA: Diagnosis not present

## 2023-07-14 DIAGNOSIS — R5383 Other fatigue: Secondary | ICD-10-CM | POA: Diagnosis not present

## 2023-07-14 DIAGNOSIS — Z79899 Other long term (current) drug therapy: Secondary | ICD-10-CM | POA: Diagnosis not present

## 2023-07-14 DIAGNOSIS — M549 Dorsalgia, unspecified: Secondary | ICD-10-CM | POA: Diagnosis not present

## 2023-07-14 DIAGNOSIS — M542 Cervicalgia: Secondary | ICD-10-CM | POA: Diagnosis not present

## 2023-07-14 DIAGNOSIS — N184 Chronic kidney disease, stage 4 (severe): Secondary | ICD-10-CM | POA: Diagnosis not present

## 2023-07-14 DIAGNOSIS — D539 Nutritional anemia, unspecified: Secondary | ICD-10-CM | POA: Diagnosis not present

## 2023-07-14 DIAGNOSIS — M5030 Other cervical disc degeneration, unspecified cervical region: Secondary | ICD-10-CM | POA: Diagnosis not present

## 2023-07-14 DIAGNOSIS — I2510 Atherosclerotic heart disease of native coronary artery without angina pectoris: Secondary | ICD-10-CM | POA: Diagnosis not present

## 2023-07-14 DIAGNOSIS — M51369 Other intervertebral disc degeneration, lumbar region without mention of lumbar back pain or lower extremity pain: Secondary | ICD-10-CM | POA: Diagnosis not present

## 2023-07-14 DIAGNOSIS — M62830 Muscle spasm of back: Secondary | ICD-10-CM | POA: Diagnosis not present

## 2023-07-14 DIAGNOSIS — I509 Heart failure, unspecified: Secondary | ICD-10-CM | POA: Diagnosis not present

## 2023-07-14 DIAGNOSIS — E7800 Pure hypercholesterolemia, unspecified: Secondary | ICD-10-CM | POA: Diagnosis not present

## 2023-07-15 DIAGNOSIS — I959 Hypotension, unspecified: Secondary | ICD-10-CM | POA: Diagnosis not present

## 2023-07-15 DIAGNOSIS — I5022 Chronic systolic (congestive) heart failure: Secondary | ICD-10-CM | POA: Diagnosis not present

## 2023-07-15 DIAGNOSIS — R809 Proteinuria, unspecified: Secondary | ICD-10-CM | POA: Diagnosis not present

## 2023-07-15 DIAGNOSIS — N184 Chronic kidney disease, stage 4 (severe): Secondary | ICD-10-CM | POA: Diagnosis not present

## 2023-07-15 DIAGNOSIS — D631 Anemia in chronic kidney disease: Secondary | ICD-10-CM | POA: Diagnosis not present

## 2023-07-15 DIAGNOSIS — N2581 Secondary hyperparathyroidism of renal origin: Secondary | ICD-10-CM | POA: Diagnosis not present

## 2023-07-16 DIAGNOSIS — Z79899 Other long term (current) drug therapy: Secondary | ICD-10-CM | POA: Diagnosis not present

## 2023-07-18 ENCOUNTER — Other Ambulatory Visit: Payer: Self-pay | Admitting: Family Medicine

## 2023-07-22 ENCOUNTER — Telehealth: Payer: Self-pay

## 2023-07-22 ENCOUNTER — Other Ambulatory Visit: Payer: Self-pay

## 2023-07-22 DIAGNOSIS — F331 Major depressive disorder, recurrent, moderate: Secondary | ICD-10-CM

## 2023-07-22 MED ORDER — ARIPIPRAZOLE 5 MG PO TABS: 5.0000 mg | ORAL_TABLET | Freq: Every day | ORAL | 2 refills | Status: DC

## 2023-07-22 NOTE — Telephone Encounter (Signed)
Called [facility] back, sent rx to their pharmacy. Also called patient and made her aware that refill was [facility] to [facility]. Left message for patient to call back if she has any questions or concerns.

## 2023-07-28 ENCOUNTER — Ambulatory Visit: Payer: 59 | Attending: Cardiology | Admitting: Cardiology

## 2023-08-12 DIAGNOSIS — M549 Dorsalgia, unspecified: Secondary | ICD-10-CM | POA: Diagnosis not present

## 2023-08-12 DIAGNOSIS — M51369 Other intervertebral disc degeneration, lumbar region without mention of lumbar back pain or lower extremity pain: Secondary | ICD-10-CM | POA: Diagnosis not present

## 2023-08-12 DIAGNOSIS — J449 Chronic obstructive pulmonary disease, unspecified: Secondary | ICD-10-CM | POA: Diagnosis not present

## 2023-08-12 DIAGNOSIS — I509 Heart failure, unspecified: Secondary | ICD-10-CM | POA: Diagnosis not present

## 2023-08-12 DIAGNOSIS — I2510 Atherosclerotic heart disease of native coronary artery without angina pectoris: Secondary | ICD-10-CM | POA: Diagnosis not present

## 2023-08-12 DIAGNOSIS — Z79899 Other long term (current) drug therapy: Secondary | ICD-10-CM | POA: Diagnosis not present

## 2023-08-12 DIAGNOSIS — G8929 Other chronic pain: Secondary | ICD-10-CM | POA: Diagnosis not present

## 2023-08-12 DIAGNOSIS — N184 Chronic kidney disease, stage 4 (severe): Secondary | ICD-10-CM | POA: Diagnosis not present

## 2023-08-12 DIAGNOSIS — M25552 Pain in left hip: Secondary | ICD-10-CM | POA: Diagnosis not present

## 2023-08-12 DIAGNOSIS — M5134 Other intervertebral disc degeneration, thoracic region: Secondary | ICD-10-CM | POA: Diagnosis not present

## 2023-08-14 DIAGNOSIS — Z79899 Other long term (current) drug therapy: Secondary | ICD-10-CM | POA: Diagnosis not present

## 2023-08-23 ENCOUNTER — Other Ambulatory Visit: Payer: Self-pay | Admitting: Family Medicine

## 2023-08-23 DIAGNOSIS — F331 Major depressive disorder, recurrent, moderate: Secondary | ICD-10-CM

## 2023-08-25 DIAGNOSIS — J449 Chronic obstructive pulmonary disease, unspecified: Secondary | ICD-10-CM | POA: Diagnosis not present

## 2023-08-26 ENCOUNTER — Other Ambulatory Visit: Payer: Self-pay | Admitting: Family Medicine

## 2023-08-26 DIAGNOSIS — E785 Hyperlipidemia, unspecified: Secondary | ICD-10-CM

## 2023-09-02 DIAGNOSIS — M1711 Unilateral primary osteoarthritis, right knee: Secondary | ICD-10-CM | POA: Diagnosis not present

## 2023-09-02 DIAGNOSIS — M170 Bilateral primary osteoarthritis of knee: Secondary | ICD-10-CM | POA: Diagnosis not present

## 2023-09-02 DIAGNOSIS — M1712 Unilateral primary osteoarthritis, left knee: Secondary | ICD-10-CM | POA: Diagnosis not present

## 2023-09-03 ENCOUNTER — Ambulatory Visit (INDEPENDENT_AMBULATORY_CARE_PROVIDER_SITE_OTHER): Payer: 59

## 2023-09-03 DIAGNOSIS — I420 Dilated cardiomyopathy: Secondary | ICD-10-CM | POA: Diagnosis not present

## 2023-09-03 LAB — CUP PACEART REMOTE DEVICE CHECK
Battery Remaining Longevity: 81 mo
Battery Voltage: 3 V
Brady Statistic AP VP Percent: 0 %
Brady Statistic AP VS Percent: 1.6 %
Brady Statistic AS VP Percent: 0.04 %
Brady Statistic AS VS Percent: 98.36 %
Brady Statistic RA Percent Paced: 1.6 %
Brady Statistic RV Percent Paced: 0.04 %
Date Time Interrogation Session: 20250108012505
HighPow Impedance: 76 Ohm
Implantable Lead Connection Status: 753985
Implantable Lead Connection Status: 753985
Implantable Lead Implant Date: 20120502
Implantable Lead Implant Date: 20120502
Implantable Lead Location: 753859
Implantable Lead Location: 753860
Implantable Lead Model: 4076
Implantable Lead Model: 7122
Implantable Pulse Generator Implant Date: 20200925
Lead Channel Impedance Value: 342 Ohm
Lead Channel Impedance Value: 646 Ohm
Lead Channel Impedance Value: 760 Ohm
Lead Channel Pacing Threshold Amplitude: 0.5 V
Lead Channel Pacing Threshold Amplitude: 1.125 V
Lead Channel Pacing Threshold Pulse Width: 0.4 ms
Lead Channel Pacing Threshold Pulse Width: 0.4 ms
Lead Channel Sensing Intrinsic Amplitude: 12.125 mV
Lead Channel Sensing Intrinsic Amplitude: 12.125 mV
Lead Channel Sensing Intrinsic Amplitude: 2.5 mV
Lead Channel Sensing Intrinsic Amplitude: 2.5 mV
Lead Channel Setting Pacing Amplitude: 1.5 V
Lead Channel Setting Pacing Amplitude: 2.25 V
Lead Channel Setting Pacing Pulse Width: 0.4 ms
Lead Channel Setting Sensing Sensitivity: 0.3 mV
Zone Setting Status: 755011
Zone Setting Status: 755011

## 2023-09-12 DIAGNOSIS — J449 Chronic obstructive pulmonary disease, unspecified: Secondary | ICD-10-CM | POA: Diagnosis not present

## 2023-09-14 ENCOUNTER — Other Ambulatory Visit: Payer: Self-pay | Admitting: Family Medicine

## 2023-09-14 DIAGNOSIS — T402X5A Adverse effect of other opioids, initial encounter: Secondary | ICD-10-CM

## 2023-09-14 DIAGNOSIS — K5903 Drug induced constipation: Secondary | ICD-10-CM

## 2023-09-19 ENCOUNTER — Other Ambulatory Visit: Payer: Self-pay | Admitting: Family Medicine

## 2023-09-19 DIAGNOSIS — F331 Major depressive disorder, recurrent, moderate: Secondary | ICD-10-CM

## 2023-09-21 DIAGNOSIS — R051 Acute cough: Secondary | ICD-10-CM | POA: Diagnosis not present

## 2023-09-21 DIAGNOSIS — R0982 Postnasal drip: Secondary | ICD-10-CM | POA: Diagnosis not present

## 2023-09-21 DIAGNOSIS — R061 Stridor: Secondary | ICD-10-CM | POA: Diagnosis not present

## 2023-09-21 DIAGNOSIS — R0981 Nasal congestion: Secondary | ICD-10-CM | POA: Diagnosis not present

## 2023-09-23 ENCOUNTER — Other Ambulatory Visit: Payer: Self-pay | Admitting: Family Medicine

## 2023-09-23 DIAGNOSIS — E782 Mixed hyperlipidemia: Secondary | ICD-10-CM

## 2023-09-24 DIAGNOSIS — M51369 Other intervertebral disc degeneration, lumbar region without mention of lumbar back pain or lower extremity pain: Secondary | ICD-10-CM | POA: Diagnosis not present

## 2023-09-24 DIAGNOSIS — N184 Chronic kidney disease, stage 4 (severe): Secondary | ICD-10-CM | POA: Diagnosis not present

## 2023-09-24 DIAGNOSIS — I509 Heart failure, unspecified: Secondary | ICD-10-CM | POA: Diagnosis not present

## 2023-09-24 DIAGNOSIS — Z0000 Encounter for general adult medical examination without abnormal findings: Secondary | ICD-10-CM | POA: Diagnosis not present

## 2023-09-24 DIAGNOSIS — Z79899 Other long term (current) drug therapy: Secondary | ICD-10-CM | POA: Diagnosis not present

## 2023-09-24 DIAGNOSIS — M549 Dorsalgia, unspecified: Secondary | ICD-10-CM | POA: Diagnosis not present

## 2023-09-24 DIAGNOSIS — G8929 Other chronic pain: Secondary | ICD-10-CM | POA: Diagnosis not present

## 2023-09-25 ENCOUNTER — Ambulatory Visit (INDEPENDENT_AMBULATORY_CARE_PROVIDER_SITE_OTHER): Payer: 59 | Admitting: Family Medicine

## 2023-09-25 ENCOUNTER — Encounter: Payer: Self-pay | Admitting: Family Medicine

## 2023-09-25 VITALS — BP 128/86 | HR 85 | Temp 98.4°F | Ht 61.5 in | Wt 150.0 lb

## 2023-09-25 DIAGNOSIS — K2100 Gastro-esophageal reflux disease with esophagitis, without bleeding: Secondary | ICD-10-CM | POA: Diagnosis not present

## 2023-09-25 DIAGNOSIS — J189 Pneumonia, unspecified organism: Secondary | ICD-10-CM

## 2023-09-25 DIAGNOSIS — J449 Chronic obstructive pulmonary disease, unspecified: Secondary | ICD-10-CM | POA: Diagnosis not present

## 2023-09-25 DIAGNOSIS — K5903 Drug induced constipation: Secondary | ICD-10-CM | POA: Diagnosis not present

## 2023-09-25 DIAGNOSIS — I5022 Chronic systolic (congestive) heart failure: Secondary | ICD-10-CM

## 2023-09-25 DIAGNOSIS — D638 Anemia in other chronic diseases classified elsewhere: Secondary | ICD-10-CM

## 2023-09-25 DIAGNOSIS — E559 Vitamin D deficiency, unspecified: Secondary | ICD-10-CM

## 2023-09-25 DIAGNOSIS — T402X5A Adverse effect of other opioids, initial encounter: Secondary | ICD-10-CM

## 2023-09-25 DIAGNOSIS — E782 Mixed hyperlipidemia: Secondary | ICD-10-CM | POA: Diagnosis not present

## 2023-09-25 DIAGNOSIS — F331 Major depressive disorder, recurrent, moderate: Secondary | ICD-10-CM

## 2023-09-25 HISTORY — DX: Pneumonia, unspecified organism: J18.9

## 2023-09-25 HISTORY — DX: Vitamin D deficiency, unspecified: E55.9

## 2023-09-25 MED ORDER — AMOXICILLIN-POT CLAVULANATE 875-125 MG PO TABS: 1.0000 | ORAL_TABLET | Freq: Two times a day (BID) | ORAL | 0 refills | Status: DC

## 2023-09-25 NOTE — Assessment & Plan Note (Signed)
Labs drawn Managed Dr. Bing Matter  Await labs/testing for assessment and recommendations.  -No changes to current management plan.

## 2023-09-25 NOTE — Assessment & Plan Note (Signed)
Well-controlled.   Continue current medical regimen as prescribed (Breo and DuoNeb)

## 2023-09-25 NOTE — Assessment & Plan Note (Signed)
Well controlled Compliant without side effects Continue Amitiza 24 mcg twice daily

## 2023-09-25 NOTE — Assessment & Plan Note (Addendum)
Well controlled The current medical regimen is effective;   please continue present plan and medications. Nexium 20 mg daily.

## 2023-09-25 NOTE — Assessment & Plan Note (Signed)
Persistent chest pain and wheezing despite recent antibiotic treatment. Chest X-ray confirmed infection. -Discontinue current antibiotic of doxycycline -Start Augmentin twice daily for 10 days. -Finish steroid treatment for inflammation.

## 2023-09-25 NOTE — Assessment & Plan Note (Signed)
Stable Labs drawn, Await labs/testing for assessment and recommendations

## 2023-09-25 NOTE — Progress Notes (Signed)
Subjective:  Patient ID: [name], female    DOB: April 29, 1956  Age: 68 y.o. MRN: [id number]  Chief Complaint  Patient presents with   Medical Management of Chronic Issues   Pneumonia   Discussed the use of AI scribe software for clinical note transcription with the patient, who gave verbal consent to proceed. '  HPI CAP: She has been experiencing severe chest pain and difficulty breathing, which prompted her visit to urgent care on "Sunday. She was diagnosed with a lung infection and prescribed an antibiotic, but there has been no improvement in her symptoms. The pain is severe enough to disrupt her sleep, as she states, 'it hurts so bad, I can't sleep at night.' She denies coughing up sputum. She has a history of pneumonia and has been using her inhalers more frequently since the recent episode.  Depression/Anxiety: Sertraline 50 mg daily, Abilify 2 mg daily. Does not see psychiatry. Melatonin 5 mg before bed which does not help much. Socially, she feels 'a little bit' more depressed than usual due to her current health issues, which have limited her ability to perform normal activities. Her daughter visits daily to assist her, and she has recently become a great-grandmother but has not visited the newborn due to her illness.   Migraines: Emgality 120 mg monthly and topamax 100 mg daily. Has 5-6 migraines a month, resolves quickly with use of ubrelvy. Uses Ubrelvy 100 mg daily and Patient was discharged by Dr. Jaffe.     GERD: Nexium 20 mg daily. Well controlled   COPD: Breo inhaler 1 once daily, duoneb as needed. Medication compliance and no side effects.Denies shortness of breath   High Cholesterol/CORONARY ARTERY DISEASE/CHF: taking crestor 40 mg daily, ASA 81. Hx of MI in 2011. Entresto 24-26 mg daily, Ranexa 500 mg TWICE DAILY, Lasix 20 mg TWICE DAILY, Coreg 3.125 TWICE DAILY. Dr Krasowski.   Osteoporosis: Prolia 60 mg every 6 months.    DDD: Belbuca 900 mcg film every 12 hours,  Oxycodone 15 mg THREE TIMES A DAY, Tizanidine 4 mg before bed, voltaren gel, gabapentin 800 mg twice a day. Was able to get elevated toilet due to difficulty standing up from standard height toilet.   Constipation: Amitiza 24 mcg twice daily daily and colace.   Fatigue: Patient reports feeling fatigue comes and goes. Her hemoglobin typically runs low, and she takes a vitamin D supplement due to previously low levels.    Allergies taking flonase and xyzal.     09/25/2023    2:39 PM 06/02/2023    4:01 PM 04/07/2023    1:58 PM 12/31/2022    3:37 PM 10/10/2022    4:32 PM  Depression screen PHQ 2/9  Decreased Interest 1 2 0 1 3  Down, Depressed, Hopeless 1 2 1 3 3  PHQ - 2 Score 2 4 1 4 6  Altered sleeping 3 0 3 0 0  Tired, decreased energy 1 2 0 1 3  Change in appetite 2 3 3 1 3  Feeling bad or failure about yourself  1 1 2 0 0  Trouble concentrating 1 0 0 1 0  Moving slowly or fidgety/restless 1 0 1 0 0  Suicidal thoughts 0 0 0 0 0  PHQ-9 Score 11 10 10 7 12  Difficult doing work/chores Not difficult at all Somewhat difficult Very difficult Not difficult at all Not difficult at all        10" /02/2023    4:00 PM  Fall Risk  Falls in the past year? 0  Number falls in past yr: 0  Injury with Fall? 0  Risk for fall due to : Impaired balance/gait  Follow up Falls evaluation completed;Follow up appointment    Patient Care Team: [name], FNP as PCP - General (Family Medicine) Regan Lemming, MD as PCP - Electrophysiology (Cardiology) Georgeanna Lea, MD as PCP - Cardiology (Cardiology) Tressie Stalker, MD as Consulting Physician (Neurosurgery) Georgeanna Lea, MD as Consulting Physician (Cardiology) Karie Soda, MD as Consulting Physician (General Surgery) Drema Dallas, DO as Consulting Physician (Neurology) Earvin Hansen, [facility] (Inactive) as Pharmacist (Pharmacist) Kerin Salen, MD as Consulting Physician (Gastroenterology) Zehr, Princella Pellegrini, PA-C as Physician  Assistant (Gastroenterology) Anthony Sar, MD as Consulting Physician (Nephrology)   Review of Systems  Constitutional:  Negative for chills, fatigue and fever.  HENT:  Negative for congestion, ear pain, rhinorrhea and sore throat.   Respiratory:  Positive for shortness of breath. Negative for cough.   Cardiovascular:  Positive for chest pain (chest xray confirmed CAP- per patient).  Gastrointestinal:  Negative for abdominal pain, constipation, diarrhea, nausea and vomiting.  Genitourinary:  Negative for dysuria and urgency.  Musculoskeletal:  Negative for back pain and myalgias.  Neurological:  Negative for dizziness, weakness, light-headedness and headaches.  Psychiatric/Behavioral:  Positive for dysphoric mood. The patient is not nervous/anxious.     Current Outpatient Medications on File Prior to Visit  Medication Sig Dispense Refill   ARIPiprazole (ABILIFY) 5 MG tablet TAKE 1 TABLET(5 MG) BY MOUTH DAILY 30 tablet 2   aspirin 81 MG tablet Take 81 mg by mouth daily.     BREO ELLIPTA 100-25 MCG/ACT AEPB INHALE 1 PUFF INTO THE LUNGS DAILY. 60 each 10   Buprenorphine HCl (BELBUCA) 900 MCG FILM Place 900 mcg inside cheek every 12 (twelve) hours.     carvedilol (COREG) 3.125 MG tablet TAKE ONE (1) TABLET BY MOUTH TWICE DAILY WITH MEALS (Patient taking differently: Take 3.125 mg by mouth 2 (two) times daily with a meal.) 60 tablet 5   denosumab (PROLIA) 60 MG/ML SOSY injection Inject 60 mg into the skin every 6 (six) months. 180 mL 2   diclofenac Sodium (VOLTAREN) 1 % GEL Apply 2 g topically 4 (four) times daily. 50 g 4   EPINEPHRINE 0.3 mg/0.3 mL IJ SOAJ injection Inject 0.3 mLs (0.3 mg total) into the muscle as needed for anaphylaxis. 1 each 2   esomeprazole (NEXIUM) 20 MG capsule TAKE ONE (1) CAPSULE BY MOUTH ONCE DAILY 90 capsule 1   fluticasone (FLONASE) 50 MCG/ACT nasal spray USE 2 SPRAYS IN EACH NOSTRILS DAILY AS NEEDED (Patient taking differently: Place 2 sprays into both nostrils  daily as needed for allergies or rhinitis.) 48 g 0   furosemide (LASIX) 40 MG tablet Take 1 tablet (40 mg total) by mouth daily. 90 tablet 3   gabapentin (NEURONTIN) 800 MG tablet Take 1 tablet (800 mg total) by mouth 3 (three) times daily. Patient usually takes twice a day 270 tablet 2   Galcanezumab-gnlm (EMGALITY) 120 MG/ML SOSY Inject 120 mg into the skin every 30 (thirty) days. 3 mL 0   ipratropium-albuterol (DUONEB) 0.5-2.5 (3) MG/3ML SOLN Use 1 vial in nebulizer every 6 (six) hours as needed for shortness of breath (Patient taking differently: Take 3 mLs by nebulization every 6 (six) hours as needed (SOB).) 3 mL 11   levocetirizine (XYZAL) 5 MG tablet Take 5 mg by mouth daily as needed for allergies.  lubiprostone (AMITIZA) 24 MCG capsule TAKE 1 CAPSULE(24 MCG) BY MOUTH TWICE DAILY WITH A MEAL 180 capsule 1   megestrol (MEGACE) 20 MG tablet Take 1 tablet (20 mg total) by mouth daily. 90 tablet 2   Multiple Vitamin (MULTIVITAMIN WITH MINERALS) TABS tablet Take 1 tablet by mouth daily.     naloxone (NARCAN) nasal spray 4 mg/0.1 mL Place 1 spray into the nose once.     nitroGLYCERIN (NITROSTAT) 0.4 MG SL tablet Place 1 tablet (0.4 mg total) under the tongue every 5 (five) minutes as needed for chest pain. 25 tablet 6   ondansetron (ZOFRAN) 4 MG tablet Take 1 tablet (4 mg total) by mouth every 8 (eight) hours as needed for nausea or vomiting. 20 tablet 0   oxyCODONE (ROXICODONE) 15 MG immediate release tablet Take 15 mg by mouth 3 (three) times daily as needed for pain.     ranolazine (RANEXA) 500 MG 12 hr tablet Take 1 tablet (500 mg total) by mouth 2 (two) times daily. TAKE ONE (1) TABLET BY MOUTH TWICE DAILY 60 tablet 4   rosuvastatin (CRESTOR) 20 MG tablet TAKE 1 TABLET BY MOUTH ONCE DAILY 90 tablet 1   rosuvastatin (CRESTOR) 40 MG tablet TAKE 1 TABLET(40 MG) BY MOUTH DAILY 90 tablet 1   sacubitril-valsartan (ENTRESTO) 24-26 MG TAKE ONE (1) TABLET BY MOUTH TWICE DAILY (Patient taking  differently: Take 1 tablet by mouth 2 (two) times daily.) 60 tablet 5   sertraline (ZOLOFT) 100 MG tablet TAKE 1/2 TABLET(50 MG) BY MOUTH DAILY 45 tablet 1   tiZANidine (ZANAFLEX) 4 MG tablet Take 4 mg by mouth at bedtime.     topiramate (TOPAMAX) 50 MG tablet TAKE TWO (2) TABLETS BY MOUTH EVERY DAY AT BEDTIME (Patient taking differently: Take 100 mg by mouth at bedtime. TAKE TWO (2) TABLETS BY MOUTH EVERY DAY AT BEDTIME) 60 tablet 5   Ubrogepant (UBRELVY) 100 MG TABS Take 1 tablet (100 mg total) by mouth as needed (May repeat after 2 hours.  Maximum 2 tablets in 24 hours). TAKE 1 TABLET BY MOUTH BY MOUTH AS NEEDED( MAY REPEAT 1 TABLET AFTER 2 HOURS IF NEEDED, MAXIMUM 2 TABLETS IN 24 HOURS) Strength: 100 mg 48 tablet 1   Vitamin D, Ergocalciferol, (DRISDOL) 1.25 MG (50000 UNIT) CAPS capsule TAKE 1 CAPSULE BY MOUTH EVERY 7 DAYS FOR 12 DOSES (Patient taking differently: Take 50,000 Units by mouth every 7 (seven) days.) 12 capsule 0   No current facility-administered medications on file prior to visit.   Past Medical History:  Diagnosis Date   Abdominal pain 10/17/2020   Abnormality of gait due to impairment of balance 11/22/2020   Acute diverticulitis 10/17/2020   Admission for long-term opiate analgesic use 10/24/2019   Arthritis of right hip 05/29/2016   Formatting of this note might be different from the original. Added automatically from request for surgery [id number]   BMI 26.0-26.9,adult 03/29/2020   Bronchial asthma    Cardiomyopathy (HCC)    Overview:  Ejection fraction 45% in 2015 Ejection fraction 30 to 35% in November 2018   Chronic back pain    Chronic constipation 07/31/2021   Chronic hypoxemic respiratory failure (HCC) 10/24/2019   Chronic narcotic use 03/23/2015   Chronic pain of both knees 12/21/2018   Added automatically from request for surgery [id number]  Formatting of this note might be different from the original. 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   Hypotension 05/17/2020   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   Ileus following gastrointestinal surgery (HCC) 03/26/2015   Major depressive disorder, single episode, moderate (HCC) 10/24/2019   Malnutrition of moderate degree (HCC) 10/20/2020   Mesenteric ischemia (HCC) 10/16/2020   Migraine without aura with status migrainosus 10/24/2019   Mixed incontinence 10/20/2020   Myocardial infarction (HCC)    Nausea & vomiting    Obstructive chronic bronchitis with exacerbation (HCC) 10/25/2019   Other spondylosis with radiculopathy, lumbar region 10/24/2019   Persistent vomiting 05/17/2020   Presence of left artificial hip joint 10/24/2019   PTSD (post-traumatic stress disorder)    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 03/23/2015 04/19/2014   Senile osteoporosis 10/24/2019   Sepsis (HCC) 10/17/2020   Serosanguineous chronic otitis media of right ear 08/02/2021   Small bowel obstruction (HCC)    Thyroid disease    Upper respiratory tract infection due to COVID-19 virus 07/06/2021   Wellness examination 03/27/2021   Past Surgical History:  Procedure Laterality Date   APPENDECTOMY     CARDIAC CATHETERIZATION     CARPAL  TUNNEL RELEASE     CESAREAN SECTION     CHF s/p AICD   12/2010   CHOLECYSTECTOMY     COLONOSCOPY  10/16/2015   Mild colonic diverticulosis, predominantly in the left colon. Small internal hemrrhoids. Otherwise normal colonoscopy   COLONOSCOPY WITH PROPOFOL N/A 09/27/2021   Procedure: COLONOSCOPY WITH PROPOFOL;  Surgeon: Napoleon Form, MD;  Location: [facility];  Service: Endoscopy;  Laterality: N/A;   CORONARY ANGIOPLASTY     ESOPHAGOGASTRODUODENOSCOPY  02/04/2014   Mild gastritis. Otherwise normal EGD   HAND SURGERY Bilateral    ICD GENERATOR CHANGEOUT N/A 05/21/2019   Procedure: ICD GENERATOR CHANGEOUT;  Surgeon: Regan Lemming, MD;  Location: [facility] INVASIVE CV LAB;  Service: Cardiovascular;  Laterality: N/A;   ICD IMPLANT     Medtronic   intestinal blockage 2011     LAPAROSCOPIC ASSISTED VENTRAL HERNIA REPAIR N/A 03/23/2015   Procedure: LAPAROSCOPIC VENTRAL WALL HERNIA REPAIR;  Surgeon: Karie Soda, MD;  Location: [facility] ORS;  Service: General;  Laterality: N/A;  With MESH   LAPAROSCOPIC LYSIS OF ADHESIONS N/A 03/23/2015   Procedure: LAPAROSCOPIC LYSIS OF ADHESIONS;  Surgeon: Karie Soda, MD;  Location: [facility] ORS;  Service: General;  Laterality: N/A;   LSCS      x2   NASAL SEPTUM SURGERY     NECK SURGERY     fused   TONSILLECTOMY     TYMPANOSTOMY TUBE PLACEMENT Right 2024   ULNAR NERVE TRANSPOSITION  01/23/2012   Procedure: ULNAR NERVE DECOMPRESSION/TRANSPOSITION;  Surgeon: Cristi Loron, MD;  Location: [facility] NEURO ORS;  Service: Neurosurgery;  Laterality: Left;  LEFT ulnar nerve decompression    Family History  Problem Relation Age of Onset   Hypertension Mother    Heart attack Mother  Alcohol abuse Father    Hypertension Father    Heart attack Father    Heart attack Other    Cancer Other    Heart failure Other    Anesthesia problems Neg Hx    Hypotension Neg Hx    Malignant hyperthermia Neg Hx    Pseudochol deficiency Neg Hx    Breast cancer Neg Hx     Social History   Socioeconomic History   Marital status: Single    Spouse name: Not on file   Number of children: 3   Years of education: Not on file   Highest education level: 10th grade  Occupational History   Occupation: disabled  Tobacco Use   Smoking status: Former    Current packs/day: 0.00    Average packs/day: 0.5 packs/day for 30.0 years (15.0 ttl pk-yrs)    Types: Cigarettes    Start date: 03/1990    Quit date: 03/2020    Years since quitting: 3.5   Smokeless tobacco: Never  Vaping Use   Vaping status: Never Used  Substance and Sexual Activity   Alcohol use: Yes    Alcohol/week: 2.0 standard drinks of alcohol    Types: 1 Cans of beer, 1 Standard drinks or equivalent per week    Comment: seldom   Drug use: No   Sexual activity: Not Currently  Other Topics Concern   Not on file  Social History Narrative   One level home with boyfriend   Caffeine - coffee 1-2 cups/day; Green tea 4-5 bottles a day   Exercise - some    Right handed      Social Drivers of Health   Financial Resource Strain: Low Risk  (04/05/2023)   Overall Financial Resource Strain (CARDIA)    Difficulty of Paying Living Expenses: Not very hard  Food Insecurity: No Food Insecurity (04/05/2023)   Hunger Vital Sign    Worried About Running Out of Food in the Last Year: Never true    Ran Out of Food in the Last Year: Never true  Transportation Needs: Unmet Transportation Needs (04/05/2023)   PRAPARE - Administrator, Civil Service (Medical): Yes    Lack of Transportation (Non-Medical): No  Physical Activity: Insufficiently Active (04/05/2023)   Exercise Vital Sign    Days of Exercise per Week: 3 days    Minutes of Exercise per Session: 20 min  Stress: Stress Concern Present (04/05/2023)   [employer] of Occupational Health - Occupational Stress Questionnaire    Feeling of Stress : Very much  Social Connections: Moderately Isolated (04/05/2023)   Social Connection and Isolation  Panel [NHANES]    Frequency of Communication with Friends and Family: More than three times a week    Frequency of Social Gatherings with Friends and Family: More than three times a week    Attends Religious Services: 1 to 4 times per year    Active Member of [employer] or Organizations: No    Attends Banker Meetings: Never    Marital Status: Divorced    Objective:  BP 128/86   Pulse 85   Temp 98.4 F (36.9 C)   Ht 5' 1.5" (1.562 m)   Wt 150 lb (68 kg)   SpO2 98%   BMI 27.88 kg/m      09/25/2023    2:07 PM 06/25/2023    3:57 PM 06/23/2023    2:35 PM  BP/Weight  Systolic BP 128 138 122  Diastolic BP 86 84 68  Wt. (  Lbs) 150 160.6 160  BMI 27.88 kg/m2 31.37 kg/m2 29.74 kg/m2    Physical Exam Vitals reviewed.  Constitutional:      Appearance: Normal appearance. She is normal weight.  Eyes:     Conjunctiva/sclera: Conjunctivae normal.  Cardiovascular:     Rate and Rhythm: Normal rate and regular rhythm.     Heart sounds: Normal heart sounds. No murmur heard. Pulmonary:     Effort: Pulmonary effort is normal. No respiratory distress.     Breath sounds: Examination of the right-lower field reveals decreased breath sounds and wheezing. Examination of the left-lower field reveals decreased breath sounds. Decreased breath sounds and wheezing present.  Musculoskeletal:        General: Normal range of motion.     Cervical back: Normal range of motion.  Skin:    General: Skin is warm.  Neurological:     Mental Status: She is alert and oriented to person, place, and time.  Psychiatric:        Mood and Affect: Mood normal.        Behavior: Behavior normal.     Lab Results  Component Value Date   WBC 8.5 06/23/2023   HGB 10.5 (L) 06/23/2023   HCT 33.1 (L) 06/23/2023   PLT 228 06/23/2023   GLUCOSE 88 06/23/2023   CHOL 223 (H) 06/23/2023   TRIG 161 (H) 06/23/2023   HDL 67 06/23/2023   LDLCALC 128 (H) 06/23/2023   ALT 5 06/23/2023   AST 13 06/23/2023   NA  142 06/23/2023   K 5.2 06/23/2023   CL 109 (H) 06/23/2023   CREATININE 1.76 (H) 06/23/2023   BUN 20 06/23/2023   CO2 20 06/23/2023   TSH 1.030 06/02/2023   INR 1.0 10/18/2020   HGBA1C 5.5 06/23/2023      Assessment & Plan:    Community acquired pneumonia of left lower lobe of lung Assessment & Plan: Persistent chest pain and wheezing despite recent antibiotic treatment. Chest X-ray confirmed infection. -Discontinue current antibiotic of doxycycline -Start Augmentin twice daily for 10 days. -Finish steroid treatment for inflammation.  Orders: -     Amoxicillin-Pot Clavulanate; Take 1 tablet by mouth 2 (two) times daily.  Dispense: 20 tablet; Refill: 0  Chronic systolic congestive heart failure, NYHA class 2 (HCC) Assessment & Plan: Labs drawn Managed Dr. Bing Matter  Await labs/testing for assessment and recommendations.  -No changes to current management plan.   Mixed hyperlipidemia Assessment & Plan: Well-controlled. Labs drawn today, Await labs/testing for assessment and recommendations  No changes to medication. Continue Crestor 40 mg before bed. Continue to work on eating a healthy diet and exercising.  Orders: -     CBC with Differential/Platelet -     Comprehensive metabolic panel -     Lipid panel -     TSH  Chronic obstructive pulmonary disease, unspecified COPD type (HCC) Assessment & Plan: Well-controlled.   Continue current medical regimen as prescribed (Breo and DuoNeb)   Vitamin D deficiency Assessment & Plan: Vitamin D level - 24.8 low - Currently taking Vitamin D 50,000 units once weekly  - labs drawn today, Await labs/testing for assessment and recommendations   Orders: -     VITAMIN D 25 Hydroxy (Vit-D Deficiency, Fractures)  Gastroesophageal reflux disease with esophagitis, unspecified whether hemorrhage Assessment & Plan: Well controlled The current medical regimen is effective;   please continue present plan and medications. Nexium  20 mg daily.    Anemia of chronic disease Assessment & Plan:  Stable Labs drawn, Await labs/testing for assessment and recommendations    Depression, major, recurrent, moderate (HCC) Assessment & Plan: Not well controlled    09/25/2023    2:39 PM 06/02/2023    4:01 PM 04/07/2023    1:58 PM  PHQ9 SCORE ONLY  PHQ-9 Total Score 11 10 10   -situational depression based on being sick and not being able to see her new great grand baby -Abilify to 5 mg before bed continue Zoloft at 50 mg daily, melatonin 5 mg before bed   Therapeutic opioid induced constipation Assessment & Plan: Well controlled Compliant without side effects Continue Amitiza 24 mcg twice daily      Meds ordered this encounter  Medications   amoxicillin-clavulanate (AUGMENTIN) 875-125 MG tablet    Sig: Take 1 tablet by mouth 2 (two) times daily.    Dispense:  20 tablet    Refill:  0    Orders Placed This Encounter  Procedures   CBC with Differential/Platelet   Comprehensive metabolic panel   Lipid panel   TSH   Vitamin D, 25-hydroxy     Follow-up: Return in about 3 months (around 12/24/2023) for chronic.  An After Visit Summary was printed and given to the patient.  Total time spent on today's visit was 45 minutes, including both face-to-face time and nonface-to-face time personally spent on review of chart (labs and imaging), discussing labs and goals, discussing further work-up, treatment options, referrals to specialist if needed, reviewing outside records if pertinent, answering patient's questions, and coordinating care.    Lajuana Matte, FNP [facility] [phone]

## 2023-09-25 NOTE — Assessment & Plan Note (Signed)
Vitamin D level - 24.8 low - Currently taking Vitamin D 50,000 units once weekly  - labs drawn today, Await labs/testing for assessment and recommendations

## 2023-09-25 NOTE — Assessment & Plan Note (Addendum)
Well-controlled. Labs drawn today, Await labs/testing for assessment and recommendations  No changes to medication. Continue Crestor 40 mg before bed. Continue to work on eating a healthy diet and exercising.

## 2023-09-25 NOTE — Assessment & Plan Note (Addendum)
Not well controlled    09/25/2023    2:39 PM 06/02/2023    4:01 PM 04/07/2023    1:58 PM  PHQ9 SCORE ONLY  PHQ-9 Total Score 11 10 10   -situational depression based on being sick and not being able to see her new great grand baby -Abilify to 5 mg before bed continue Zoloft at 50 mg daily, melatonin 5 mg before bed

## 2023-09-26 LAB — COMPREHENSIVE METABOLIC PANEL
ALT: 7 [IU]/L (ref 0–32)
AST: 12 [IU]/L (ref 0–40)
Albumin: 4.3 g/dL (ref 3.9–4.9)
Alkaline Phosphatase: 78 [IU]/L (ref 44–121)
BUN/Creatinine Ratio: 18 (ref 12–28)
BUN: 32 mg/dL — ABNORMAL HIGH (ref 8–27)
Bilirubin Total: 0.3 mg/dL (ref 0.0–1.2)
CO2: 16 mmol/L — ABNORMAL LOW (ref 20–29)
Calcium: 9.9 mg/dL (ref 8.7–10.3)
Chloride: 108 mmol/L — ABNORMAL HIGH (ref 96–106)
Creatinine, Ser: 1.76 mg/dL — ABNORMAL HIGH (ref 0.57–1.00)
Globulin, Total: 2.2 g/dL (ref 1.5–4.5)
Glucose: 131 mg/dL — ABNORMAL HIGH (ref 70–99)
Potassium: 4.6 mmol/L (ref 3.5–5.2)
Sodium: 139 mmol/L (ref 134–144)
Total Protein: 6.5 g/dL (ref 6.0–8.5)
eGFR: 31 mL/min/{1.73_m2} — ABNORMAL LOW (ref >59)

## 2023-09-26 LAB — LIPID PANEL
Chol/HDL Ratio: 2.7 {ratio} (ref 0.0–4.4)
Cholesterol, Total: 231 mg/dL — ABNORMAL HIGH (ref 100–199)
HDL: 87 mg/dL (ref >39)
LDL Chol Calc (NIH): 124 mg/dL — ABNORMAL HIGH (ref 0–99)
Triglycerides: 117 mg/dL (ref 0–149)
VLDL Cholesterol Cal: 20 mg/dL (ref 5–40)

## 2023-09-26 LAB — VITAMIN D 25 HYDROXY (VIT D DEFICIENCY, FRACTURES): Vit D, 25-Hydroxy: 26.4 ng/mL — ABNORMAL LOW (ref 30.0–100.0)

## 2023-09-26 LAB — CBC WITH DIFFERENTIAL/PLATELET
Basophils Absolute: 0 10*3/uL (ref 0.0–0.2)
Basos: 0 %
EOS (ABSOLUTE): 0 10*3/uL (ref 0.0–0.4)
Eos: 0 %
Hematocrit: 37.5 % (ref 34.0–46.6)
Hemoglobin: 12.7 g/dL (ref 11.1–15.9)
Immature Grans (Abs): 0.1 10*3/uL (ref 0.0–0.1)
Immature Granulocytes: 1 %
Lymphocytes Absolute: 0.6 10*3/uL — ABNORMAL LOW (ref 0.7–3.1)
Lymphs: 6 %
MCH: 29.4 pg (ref 26.6–33.0)
MCHC: 33.9 g/dL (ref 31.5–35.7)
MCV: 87 fL (ref 79–97)
Monocytes Absolute: 0.1 10*3/uL (ref 0.1–0.9)
Monocytes: 2 %
Neutrophils Absolute: 8.8 10*3/uL — ABNORMAL HIGH (ref 1.4–7.0)
Neutrophils: 91 %
Platelets: 314 10*3/uL (ref 150–450)
RBC: 4.32 x10E6/uL (ref 3.77–5.28)
RDW: 13.5 % (ref 11.7–15.4)
WBC: 9.6 10*3/uL (ref 3.4–10.8)

## 2023-09-26 LAB — TSH: TSH: 0.915 u[IU]/mL (ref 0.450–4.500)

## 2023-10-02 ENCOUNTER — Ambulatory Visit: Payer: Self-pay | Admitting: Family Medicine

## 2023-10-02 ENCOUNTER — Ambulatory Visit: Payer: 59 | Admitting: Family Medicine

## 2023-10-02 ENCOUNTER — Encounter: Payer: Self-pay | Admitting: Family Medicine

## 2023-10-02 VITALS — BP 116/82 | HR 96 | Temp 97.2°F | Ht 61.5 in | Wt 148.0 lb

## 2023-10-02 DIAGNOSIS — E440 Moderate protein-calorie malnutrition: Secondary | ICD-10-CM

## 2023-10-02 DIAGNOSIS — J189 Pneumonia, unspecified organism: Secondary | ICD-10-CM | POA: Diagnosis not present

## 2023-10-02 DIAGNOSIS — N3941 Urge incontinence: Secondary | ICD-10-CM | POA: Diagnosis not present

## 2023-10-02 DIAGNOSIS — J441 Chronic obstructive pulmonary disease with (acute) exacerbation: Secondary | ICD-10-CM

## 2023-10-02 MED ORDER — AZITHROMYCIN 250 MG PO TABS: ORAL_TABLET | ORAL | 0 refills | Status: DC

## 2023-10-02 MED ORDER — IPRATROPIUM-ALBUTEROL 0.5-2.5 (3) MG/3ML IN SOLN: 3.0000 mL | Freq: Four times a day (QID) | RESPIRATORY_TRACT | 0 refills | Status: DC | PRN

## 2023-10-02 MED ORDER — IPRATROPIUM-ALBUTEROL 0.5-2.5 (3) MG/3ML IN SOLN
3.0000 mL | Freq: Once | RESPIRATORY_TRACT | Status: AC
  Administered 2023-10-02: 3 mL via RESPIRATORY_TRACT

## 2023-10-02 MED ORDER — PREDNISONE 20 MG PO TABS: 40.0000 mg | ORAL_TABLET | Freq: Every day | ORAL | 0 refills | Status: AC

## 2023-10-02 MED ORDER — CEFTRIAXONE SODIUM 1 G IJ SOLR
1.0000 g | Freq: Once | INTRAMUSCULAR | Status: AC
  Administered 2023-10-02: 1 g via INTRAMUSCULAR

## 2023-10-02 NOTE — Telephone Encounter (Signed)
 Copied from CRM [id number]. Topic: Clinical - Red Word Triage >> Oct 02, 2023  1:32 PM Montie POUR wrote: Red Word that prompted transfer to Nurse Triage: [name] is still having severe pain in her chest. I saw this from her 09/25/23 visit from Dr. Teressa  Persistent chest pain and wheezing despite recent antibiotic treatment. Chest X-ray confirmed infection. -Discontinue current antibiotic of doxycycline -Start Augmentin  twice daily for 10 days. -Finish steroid treatment for inflammation.  Chief Complaint: chest pain Symptoms: chest pain located at left lung area 10/10, pain occurs with cough, changing position, deep breathing, sneezing, yellow nasal drainage  Frequency: 2 weeks - ever since infection started,  Pertinent Negatives: Patient denies radiation, Disposition: [] ED /[] Urgent Care (no appt availability in office) / [x] Appointment(In office/virtual)/ []  [facility]/ [] Home Care/ [] Refused Recommended Disposition /[] [facility]/ []  Follow-up with PCP Additional Notes: asthma inhaler does not help with pain. Pt states some nausea and everything tastes different now. Also c/o yellow nasal drainage   Reason for Disposition  Fever > 100.4 F (38.0 C)    Severe chest pain in left lung area: patient recovering from left lung infection:  Answer Assessment - Initial Assessment Questions 1. LOCATION: Where does it hurt?     Pain just in left lung 2. RADIATION: Does the pain go anywhere else? (e.g., into neck, jaw, arms, back)     N/a 3. ONSET: When did the chest pain begin? (Minutes, hours or days)      2 weeks 4. PATTERN: Does the pain come and go, or has it been constant since it started?  Does it get worse with exertion?      Comes and goes 5. DURATION: How long does it last (e.g., seconds, minutes, hours)     N/a 6. SEVERITY: How bad is the pain?  (e.g., Scale 1-10; mild, moderate, or severe)    - MILD (1-3): doesn't interfere with normal activities      - MODERATE (4-7): interferes with normal activities or awakens from sleep    - SEVERE (8-10): excruciating pain, unable to do any normal activities      10/10  Severe when breath in or move a certain way - pain in left lung has lung 7. CARDIAC RISK FACTORS: Do you have any history of heart problems or risk factors for heart disease? (e.g., angina, prior heart attack; diabetes, high blood pressure, high cholesterol, smoker, or strong family history of heart disease)     N/a 8. PULMONARY RISK FACTORS: Do you have any history of lung disease?  (e.g., blood clots in lung, asthma, emphysema, birth control pills)     asthma 9. CAUSE: What do you think is causing the chest pain?     Infection in lung 10. OTHER SYMPTOMS: Do you have any other symptoms? (e.g., dizziness, nausea, vomiting, sweating, fever, difficulty breathing, cough)       Nausea, nothing tastes good anymore, deep breathes hurt, sneezing hurts, coughing hurts, moving from sitting to standing causes pain in left lung 11. PREGNANCY: Is there any chance you are pregnant? When was your last menstrual period?       N/a  Protocols used: Chest Pain-A-AH

## 2023-10-02 NOTE — Assessment & Plan Note (Signed)
 Persistent symptoms despite two courses of antibiotics (Amoxicillin  and Augmentin ). Suspected resistance to penicillin or different causative bacteria. -Administer Rocephin  shot today. -Prescribe Azithromycin  (Z-Pak). -If no improvement, consider CT scan of chest to rule out other causes.

## 2023-10-02 NOTE — Progress Notes (Signed)
 Acute Office Visit  Subjective:    Patient ID: [name], female    DOB: 09/20/1955, 68 y.o.   MRN: [id number]  Chief Complaint  Patient presents with   Left lung pain    Discussed the use of AI scribe software for clinical note transcription with the patient, who gave verbal consent to proceed.   HPI: [name] is a 68 year old female with COPD who presents with persistent left sided pain and shortness of breath.  She experiences persistent chest pain primarily on the left side, which worsens with deep breaths. These symptoms have persisted despite completing a course of antibiotics, including amoxicillin  and Augmentin , for pneumonia diagnosed at [facility] urgent care. She has never had a recent chest CT scan, but a chest x-ray was performed at [facility] urgent care before her last visit.  She has a history of COPD and uses a Breo inhaler regularly, along with an albuterol  inhaler as needed. She has not been using her nebulizer because she can't find hers. She experiences wheezing and difficulty breathing, especially when walking, but can breathe fine at rest. She uses oxygen  at night, prescribed by Dr. Abran, but has not used it recently due to her current condition.  She quit smoking a long time ago and denies any recent exposure to sick contacts. She has been staying at home and has not visited her great grandbaby due to her illness. She denies any allergies and is not diabetic. She reports that her kidney function is not optimal.  Past Medical History:  Diagnosis Date   Abdominal pain 10/17/2020   Abnormality of gait due to impairment of balance 11/22/2020   Acute diverticulitis 10/17/2020   Admission for long-term opiate analgesic use 10/24/2019   Arthritis of right hip 05/29/2016   Formatting of this note might be different from the original. Added automatically from request for surgery [id number]   BMI 26.0-26.9,adult 03/29/2020   Bronchial asthma    Cardiomyopathy  (HCC)    Overview:  Ejection fraction 45% in 2015 Ejection fraction 30 to 35% in November 2018   Chronic back pain    Chronic constipation 07/31/2021   Chronic hypoxemic respiratory failure (HCC) 10/24/2019   Chronic narcotic use 03/23/2015   Chronic pain of both knees 12/21/2018   Added automatically from request for surgery [id number]  Formatting of this note might be different from the original. 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   Hypotension 05/17/2020   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   Ileus following gastrointestinal surgery (HCC) 03/26/2015   Major depressive disorder, single episode, moderate (HCC) 10/24/2019   Malnutrition of moderate degree (HCC) 10/20/2020   Mesenteric ischemia (HCC) 10/16/2020   Migraine without aura with status migrainosus 10/24/2019   Mixed incontinence 10/20/2020   Myocardial infarction (HCC)  Nausea & vomiting    Obstructive chronic bronchitis with exacerbation (HCC) 10/25/2019   Other spondylosis with radiculopathy, lumbar region 10/24/2019   Persistent vomiting 05/17/2020   Presence of left artificial hip joint 10/24/2019   PTSD (post-traumatic stress disorder)    Recurrent  incisional hernias with incarceration s/p lap repair w mesh 03/23/2015 04/19/2014   Senile osteoporosis 10/24/2019   Sepsis (HCC) 10/17/2020   Serosanguineous chronic otitis media of right ear 08/02/2021   Small bowel obstruction (HCC)    Thyroid  disease    Upper respiratory tract infection due to COVID-19 virus 07/06/2021   Wellness examination 03/27/2021    Past Surgical History:  Procedure Laterality Date   APPENDECTOMY     CARDIAC CATHETERIZATION     CARPAL TUNNEL RELEASE     CESAREAN SECTION     CHF s/p AICD   12/2010   CHOLECYSTECTOMY     COLONOSCOPY  10/16/2015   Mild colonic diverticulosis, predominantly in the left colon. Small internal hemrrhoids. Otherwise normal colonoscopy   COLONOSCOPY WITH PROPOFOL  N/A 09/27/2021   Procedure: COLONOSCOPY WITH PROPOFOL ;  Surgeon: Shila Gustav GAILS, MD;  Location: [facility];  Service: Endoscopy;  Laterality: N/A;   CORONARY ANGIOPLASTY     ESOPHAGOGASTRODUODENOSCOPY  02/04/2014   Mild gastritis. Otherwise normal EGD   HAND SURGERY Bilateral    ICD GENERATOR CHANGEOUT N/A 05/21/2019   Procedure: ICD GENERATOR CHANGEOUT;  Surgeon: Inocencio Soyla Lunger, MD;  Location: [facility] INVASIVE CV LAB;  Service: Cardiovascular;  Laterality: N/A;   ICD IMPLANT     Medtronic   intestinal blockage 2011     LAPAROSCOPIC ASSISTED VENTRAL HERNIA REPAIR N/A 03/23/2015   Procedure: LAPAROSCOPIC VENTRAL WALL HERNIA REPAIR;  Surgeon: Elspeth Schultze, MD;  Location: [facility] ORS;  Service: General;  Laterality: N/A;  With MESH   LAPAROSCOPIC LYSIS OF ADHESIONS N/A 03/23/2015   Procedure: LAPAROSCOPIC LYSIS OF ADHESIONS;  Surgeon: Elspeth Schultze, MD;  Location: [facility];  Service: General;  Laterality: N/A;   LSCS      x2   NASAL SEPTUM SURGERY     NECK SURGERY     fused   TONSILLECTOMY     TYMPANOSTOMY TUBE PLACEMENT Right 2024   ULNAR NERVE TRANSPOSITION  01/23/2012   Procedure: ULNAR NERVE DECOMPRESSION/TRANSPOSITION;  Surgeon: Reyes JONETTA Budge, MD;  Location: [facility]  NEURO ORS;  Service: Neurosurgery;  Laterality: Left;  LEFT ulnar nerve decompression    Family History  Problem Relation Age of Onset   Hypertension Mother    Heart attack Mother    Alcohol  abuse Father    Hypertension Father    Heart attack Father    Heart attack Other    Cancer Other    Heart failure Other    Anesthesia problems Neg Hx    Hypotension Neg Hx    Malignant hyperthermia Neg Hx    Pseudochol deficiency Neg Hx    Breast cancer Neg Hx     Social History   Socioeconomic History   Marital status: Single    Spouse name: Not on file   Number of children: 3   Years of education: Not on file   Highest education level: 10th grade  Occupational History   Occupation: disabled  Tobacco Use   Smoking status: Former    Current packs/day: 0.00    Average packs/day: 0.5 packs/day for 30.0 years (15.0 ttl pk-yrs)    Types: Cigarettes    Start date: 03/1990    Quit date: 03/2020    Years  since quitting: 3.5   Smokeless tobacco: Never  Vaping Use   Vaping status: Never Used  Substance and Sexual Activity   Alcohol  use: Yes    Alcohol /week: 2.0 standard drinks of alcohol     Types: 1 Cans of beer, 1 Standard drinks or equivalent per week    Comment: seldom   Drug use: No   Sexual activity: Not Currently  Other Topics Concern   Not on file  Social History Narrative   One level home with boyfriend   Caffeine - coffee 1-2 cups/day; Green tea 4-5 bottles a day   Exercise - some    Right handed      Social Drivers of Health   Financial Resource Strain: Low Risk  (04/05/2023)   Overall Financial Resource Strain (CARDIA)    Difficulty of Paying Living Expenses: Not very hard  Food Insecurity: No Food Insecurity (04/05/2023)   Hunger Vital Sign    Worried About Running Out of Food in the Last Year: Never true    Ran Out of Food in the Last Year: Never true  Transportation Needs: Unmet Transportation Needs (04/05/2023)   PRAPARE - Scientist, Research (physical Sciences) (Medical): Yes    Lack of Transportation (Non-Medical): No  Physical Activity: Insufficiently Active (04/05/2023)   Exercise Vital Sign    Days of Exercise per Week: 3 days    Minutes of Exercise per Session: 20 min  Stress: Stress Concern Present (04/05/2023)   [employer] of Occupational Health - Occupational Stress Questionnaire    Feeling of Stress : Very much  Social Connections: Moderately Isolated (04/05/2023)   Social Connection and Isolation Panel [NHANES]    Frequency of Communication with Friends and Family: More than three times a week    Frequency of Social Gatherings with Friends and Family: More than three times a week    Attends Religious Services: 1 to 4 times per year    Active Member of [employer] or Organizations: No    Attends Banker Meetings: Never    Marital Status: Divorced  Catering Manager Violence: Not At Risk (12/31/2022)   Humiliation, Afraid, Rape, and Kick questionnaire    Fear of Current or Ex-Partner: No    Emotionally Abused: No    Physically Abused: No    Sexually Abused: No    Outpatient Medications Prior to Visit  Medication Sig Dispense Refill   ARIPiprazole  (ABILIFY ) 5 MG tablet TAKE 1 TABLET(5 MG) BY MOUTH DAILY 30 tablet 2   aspirin  81 MG tablet Take 81 mg by mouth daily.     BREO ELLIPTA  100-25 MCG/ACT AEPB INHALE 1 PUFF INTO THE LUNGS DAILY. 60 each 10   Buprenorphine  HCl (BELBUCA ) 900 MCG FILM Place 900 mcg inside cheek every 12 (twelve) hours.     carvedilol  (COREG ) 3.125 MG tablet TAKE ONE (1) TABLET BY MOUTH TWICE DAILY WITH MEALS (Patient taking differently: Take 3.125 mg by mouth 2 (two) times daily with a meal.) 60 tablet 5   denosumab  (PROLIA ) 60 MG/ML SOSY injection Inject 60 mg into the skin every 6 (six) months. 180 mL 2   diclofenac  Sodium (VOLTAREN ) 1 % GEL Apply 2 g topically 4 (four) times daily. 50 g 4   EPINEPHRINE  0.3 mg/0.3 mL IJ SOAJ injection Inject 0.3 mLs (0.3 mg total) into the muscle as  needed for anaphylaxis. 1 each 2   esomeprazole  (NEXIUM ) 20 MG capsule TAKE ONE (1) CAPSULE BY MOUTH ONCE DAILY 90 capsule 1  fluticasone  (FLONASE ) 50 MCG/ACT nasal spray USE 2 SPRAYS IN EACH NOSTRILS DAILY AS NEEDED (Patient taking differently: Place 2 sprays into both nostrils daily as needed for allergies or rhinitis.) 48 g 0   gabapentin  (NEURONTIN ) 800 MG tablet Take 1 tablet (800 mg total) by mouth 3 (three) times daily. Patient usually takes twice a day 270 tablet 2   Galcanezumab -gnlm (EMGALITY ) 120 MG/ML SOSY Inject 120 mg into the skin every 30 (thirty) days. 3 mL 0   levocetirizine (XYZAL) 5 MG tablet Take 5 mg by mouth daily as needed for allergies.     lubiprostone  (AMITIZA ) 24 MCG capsule TAKE 1 CAPSULE(24 MCG) BY MOUTH TWICE DAILY WITH A MEAL 180 capsule 1   megestrol  (MEGACE ) 20 MG tablet Take 1 tablet (20 mg total) by mouth daily. 90 tablet 2   Multiple Vitamin (MULTIVITAMIN WITH MINERALS) TABS tablet Take 1 tablet by mouth daily.     naloxone  (NARCAN ) nasal spray 4 mg/0.1 mL Place 1 spray into the nose once.     nitroGLYCERIN  (NITROSTAT ) 0.4 MG SL tablet Place 1 tablet (0.4 mg total) under the tongue every 5 (five) minutes as needed for chest pain. 25 tablet 6   ondansetron  (ZOFRAN ) 4 MG tablet Take 1 tablet (4 mg total) by mouth every 8 (eight) hours as needed for nausea or vomiting. 20 tablet 0   oxyCODONE  (ROXICODONE ) 15 MG immediate release tablet Take 15 mg by mouth 3 (three) times daily as needed for pain.     ranolazine  (RANEXA ) 500 MG 12 hr tablet Take 1 tablet (500 mg total) by mouth 2 (two) times daily. TAKE ONE (1) TABLET BY MOUTH TWICE DAILY 60 tablet 4   rosuvastatin  (CRESTOR ) 20 MG tablet TAKE 1 TABLET BY MOUTH ONCE DAILY 90 tablet 1   rosuvastatin  (CRESTOR ) 40 MG tablet TAKE 1 TABLET(40 MG) BY MOUTH DAILY 90 tablet 1   sacubitril -valsartan  (ENTRESTO ) 24-26 MG TAKE ONE (1) TABLET BY MOUTH TWICE DAILY (Patient taking differently: Take 1 tablet by mouth 2 (two) times  daily.) 60 tablet 5   sertraline  (ZOLOFT ) 100 MG tablet TAKE 1/2 TABLET(50 MG) BY MOUTH DAILY 45 tablet 1   tiZANidine  (ZANAFLEX ) 4 MG tablet Take 4 mg by mouth at bedtime.     topiramate  (TOPAMAX ) 50 MG tablet TAKE TWO (2) TABLETS BY MOUTH EVERY DAY AT BEDTIME (Patient taking differently: Take 100 mg by mouth at bedtime. TAKE TWO (2) TABLETS BY MOUTH EVERY DAY AT BEDTIME) 60 tablet 5   Ubrogepant  (UBRELVY ) 100 MG TABS Take 1 tablet (100 mg total) by mouth as needed (May repeat after 2 hours.  Maximum 2 tablets in 24 hours). TAKE 1 TABLET BY MOUTH BY MOUTH AS NEEDED( MAY REPEAT 1 TABLET AFTER 2 HOURS IF NEEDED, MAXIMUM 2 TABLETS IN 24 HOURS) Strength: 100 mg 48 tablet 1   Vitamin D , Ergocalciferol , (DRISDOL ) 1.25 MG (50000 UNIT) CAPS capsule TAKE 1 CAPSULE BY MOUTH EVERY 7 DAYS FOR 12 DOSES (Patient taking differently: Take 50,000 Units by mouth every 7 (seven) days.) 12 capsule 0   amoxicillin -clavulanate (AUGMENTIN ) 875-125 MG tablet Take 1 tablet by mouth 2 (two) times daily. 20 tablet 0   ipratropium-albuterol  (DUONEB) 0.5-2.5 (3) MG/3ML SOLN Use 1 vial in nebulizer every 6 (six) hours as needed for shortness of breath (Patient taking differently: Take 3 mLs by nebulization every 6 (six) hours as needed (SOB).) 3 mL 11   furosemide  (LASIX ) 40 MG tablet Take 1 tablet (40 mg total) by mouth daily. 90  tablet 3   No facility-administered medications prior to visit.    No Known Allergies  Review of Systems  Constitutional:  Negative for appetite change, fatigue and fever.  HENT:  Negative for congestion, ear pain, sinus pressure and sore throat.   Respiratory:  Negative for cough, chest tightness, shortness of breath and wheezing.   Cardiovascular:  Negative for chest pain and palpitations.  Gastrointestinal:  Negative for abdominal pain, constipation, diarrhea, nausea and vomiting.  Genitourinary:  Negative for dysuria and hematuria.  Musculoskeletal:  Positive for myalgias (Left lung pain-  hurts to breathe in and out). Negative for arthralgias, back pain and joint swelling.  Skin:  Negative for rash.  Neurological:  Negative for dizziness, weakness and headaches.  Psychiatric/Behavioral:  Negative for dysphoric mood. The patient is not nervous/anxious.        Objective:        10/02/2023    3:40 PM 09/25/2023    2:07 PM 06/25/2023    3:57 PM  Vitals with BMI  Height 5' 1.5 5' 1.5 5' 0  Weight 148 lbs 150 lbs 160 lbs 10 oz  BMI 27.51 27.89 31.37  Systolic 116 128 861  Diastolic 82 86 84  Pulse 96 85 65    Orthostatic VS for the past 72 hrs (Last 3 readings):  Patient Position BP Location  10/02/23 1540 Sitting Right Arm     Physical Exam Constitutional:      General: She is not in acute distress.    Appearance: Normal appearance. She is ill-appearing.  Cardiovascular:     Rate and Rhythm: Normal rate and regular rhythm.     Heart sounds: Normal heart sounds. No murmur heard. Pulmonary:     Effort: Pulmonary effort is normal.     Breath sounds: Examination of the left-upper field reveals decreased breath sounds. Examination of the left-middle field reveals decreased breath sounds. Examination of the right-lower field reveals decreased breath sounds and rales. Examination of the left-lower field reveals decreased breath sounds and rales. Decreased breath sounds and rales present.     Comments: Duoneb administered in the office, sounded better after treatment.  Chest:     Chest wall: Tenderness (left) present.  Abdominal:     Tenderness: There is no abdominal tenderness.  Skin:    General: Skin is warm.  Neurological:     Mental Status: She is alert. Mental status is at baseline.  Psychiatric:        Mood and Affect: Mood normal.        Behavior: Behavior normal.     Health Maintenance Due  Topic Date Due   Zoster Vaccines- Shingrix (1 of 2) Never done   Medicare Annual Wellness (AWV)  10/11/2023    There are no preventive care reminders to  display for this patient.   Lab Results  Component Value Date   TSH 0.915 09/25/2023   Lab Results  Component Value Date   WBC 9.6 09/25/2023   HGB 12.7 09/25/2023   HCT 37.5 09/25/2023   MCV 87 09/25/2023   PLT 314 09/25/2023   Lab Results  Component Value Date   NA 139 09/25/2023   K 4.6 09/25/2023   CO2 16 (L) 09/25/2023   GLUCOSE 131 (H) 09/25/2023   BUN 32 (H) 09/25/2023   CREATININE 1.76 (H) 09/25/2023   BILITOT 0.3 09/25/2023   ALKPHOS 78 09/25/2023   AST 12 09/25/2023   ALT 7 09/25/2023   PROT 6.5 09/25/2023   ALBUMIN  4.3  09/25/2023   CALCIUM  9.9 09/25/2023   ANIONGAP 9 07/03/2022   EGFR 31 (L) 09/25/2023   Lab Results  Component Value Date   CHOL 231 (H) 09/25/2023   Lab Results  Component Value Date   HDL 87 09/25/2023   Lab Results  Component Value Date   LDLCALC 124 (H) 09/25/2023   Lab Results  Component Value Date   TRIG 117 09/25/2023   Lab Results  Component Value Date   CHOLHDL 2.7 09/25/2023   Lab Results  Component Value Date   HGBA1C 5.5 06/23/2023       Assessment & Plan:  Community acquired pneumonia of left lower lobe of lung Assessment & Plan: Persistent symptoms despite two courses of antibiotics (Amoxicillin  and Augmentin ). Suspected resistance to penicillin or different causative bacteria. -Administer Rocephin  shot today. -Prescribe Azithromycin  (Z-Pak). -If no improvement, consider CT scan of chest to rule out other causes.  Orders: -     Azithromycin ; 2 DAILY FOR FIRST DAY, THEN DECREASE TO ONE DAILY FOR 4 MORE DAYS.  Dispense: 6 tablet; Refill: 0 -     cefTRIAXone  Sodium  COPD exacerbation (HCC) Assessment & Plan: -Administer DuoNeb nebulizer treatment in office today. -Prescribe DuoNeb for home use with nebulizer every 6 hours as needed for shortness of breath. -Continue using Breo inhaler. -Consider use of home oxygen  if shortness of breath persists. Inflammation Likely secondary to pneumonia and COPD  exacerbation. -Prescribe Prednisone  40mg  daily for 5 days to reduce inflammation. Educated on when to go to the ED  Orders: -     predniSONE ; Take 2 tablets (40 mg total) by mouth daily with breakfast for 5 days.  Dispense: 10 tablet; Refill: 0 -     For home use only DME Nebulizer machine -     Ipratropium-Albuterol ; Take 3 mLs by nebulization every 6 (six) hours as needed (SOB).  Dispense: 90 mL; Refill: 0 -     Ipratropium-Albuterol      Meds ordered this encounter  Medications   azithromycin  (ZITHROMAX ) 250 MG tablet    Sig: 2 DAILY FOR FIRST DAY, THEN DECREASE TO ONE DAILY FOR 4 MORE DAYS.    Dispense:  6 tablet    Refill:  0   predniSONE  (DELTASONE ) 20 MG tablet    Sig: Take 2 tablets (40 mg total) by mouth daily with breakfast for 5 days.    Dispense:  10 tablet    Refill:  0   ipratropium-albuterol  (DUONEB) 0.5-2.5 (3) MG/3ML SOLN    Sig: Take 3 mLs by nebulization every 6 (six) hours as needed (SOB).    Dispense:  90 mL    Refill:  0   ipratropium-albuterol  (DUONEB) 0.5-2.5 (3) MG/3ML nebulizer solution 3 mL   cefTRIAXone  (ROCEPHIN ) injection 1 g    Orders Placed This Encounter  Procedures   For home use only DME Nebulizer machine     Follow-up: Return if symptoms worsen or fail to improve.  An After Visit Summary was printed and given to the patient.  Total time spent on today's visit was 38 minutes, including both face-to-face time and nonface-to-face time personally spent on review of chart (labs and imaging), discussing labs and goals, discussing further work-up, treatment options, referrals to specialist if needed, reviewing outside records if pertinent, answering patient's questions, and coordinating care.   Harrie Cedar, FNP [facility] [phone]

## 2023-10-02 NOTE — Progress Notes (Deleted)
 Acute Office Visit  Subjective:    Patient ID: [name], female    DOB: 1956/08/03, 68 y.o.   MRN: [id number]  Chief Complaint  Patient presents with   Lung pain    Discussed the use of AI scribe software for clinical note transcription with the patient, who gave verbal consent to proceed.      HPI: Patient is in today for ***  Past Medical History:  Diagnosis Date   Abdominal pain 10/17/2020   Abnormality of gait due to impairment of balance 11/22/2020   Acute diverticulitis 10/17/2020   Admission for long-term opiate analgesic use 10/24/2019   Arthritis of right hip 05/29/2016   Formatting of this note might be different from the original. Added automatically from request for surgery [id number]   BMI 26.0-26.9,adult 03/29/2020   Bronchial asthma    Cardiomyopathy (HCC)    Overview:  Ejection fraction 45% in 2015 Ejection fraction 30 to 35% in November 2018   Chronic back pain    Chronic constipation 07/31/2021   Chronic hypoxemic respiratory failure (HCC) 10/24/2019   Chronic narcotic use 03/23/2015   Chronic pain of both knees 12/21/2018   Added automatically from request for surgery [id number]  Formatting of this note might be different from the original. 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   Dyslipidemia 05/31/2015   GERD  (gastroesophageal reflux disease)    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HCC) 07/13/2020   Hypotension 05/17/2020   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   Ileus following gastrointestinal surgery (HCC) 03/26/2015   Major depressive disorder, single episode, moderate (HCC) 10/24/2019   Malnutrition of moderate degree (HCC) 10/20/2020   Mesenteric ischemia (HCC) 10/16/2020   Migraine without aura with status migrainosus 10/24/2019   Mixed incontinence 10/20/2020   Myocardial infarction (HCC)    Nausea & vomiting    Obstructive chronic bronchitis with exacerbation (HCC) 10/25/2019   Other spondylosis with radiculopathy, lumbar region 10/24/2019   Persistent vomiting 05/17/2020   Presence of left artificial hip joint 10/24/2019   PTSD (post-traumatic stress disorder)    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 03/23/2015 04/19/2014   Senile osteoporosis 10/24/2019   Sepsis (HCC) 10/17/2020   Serosanguineous chronic otitis media of right ear 08/02/2021   Small bowel obstruction (HCC)    Thyroid  disease    Upper respiratory tract infection due to COVID-19 virus 07/06/2021   Wellness examination 03/27/2021    Past Surgical History:  Procedure Laterality Date   APPENDECTOMY     CARDIAC CATHETERIZATION     CARPAL TUNNEL RELEASE     CESAREAN SECTION     CHF s/p AICD   12/2010   CHOLECYSTECTOMY     COLONOSCOPY  10/16/2015   Mild colonic diverticulosis, predominantly in the left colon. Small internal hemrrhoids. Otherwise normal colonoscopy   COLONOSCOPY WITH PROPOFOL  N/A 09/27/2021   Procedure: COLONOSCOPY WITH PROPOFOL ;  Surgeon: Nandigam, Kavitha V,  MD;  Location: [facility];  Service: Endoscopy;  Laterality: N/A;   CORONARY ANGIOPLASTY     ESOPHAGOGASTRODUODENOSCOPY  02/04/2014   Mild gastritis. Otherwise normal EGD   HAND SURGERY Bilateral    ICD GENERATOR CHANGEOUT N/A 05/21/2019   Procedure: ICD GENERATOR CHANGEOUT;  Surgeon: Inocencio Soyla Lunger, MD;  Location: [facility] IN[facility]ASIVE CV LAB;  Service: Cardiovascular;  Laterality: N/A;   ICD IMPLANT     Medtronic   intestinal blockage 2011     LAPAROSCOPIC ASSISTED VENTRAL HERNIA REPAIR N/A 03/23/2015   Procedure: LAPAROSCOPIC VENTRAL WALL HERNIA REPAIR;  Surgeon: Elspeth Schultze, MD;  Location: [facility];  Service: General;  Laterality: N/A;  With MESH   LAPAROSCOPIC LYSIS OF ADHESIONS N/A 03/23/2015   Procedure: LAPAROSCOPIC LYSIS OF ADHESIONS;  Surgeon: Elspeth Schultze, MD;  Location: [facility];  Service: General;  Laterality: N/A;   LSCS      x2   NASAL SEPTUM SURGERY     NECK SURGERY     fused   TONSILLECTOMY     TYMPANOSTOMY TUBE PLACEMENT Right 2024   ULNAR NERVE TRANSPOSITION  01/23/2012   Procedure: ULNAR NERVE DECOMPRESSION/TRANSPOSITION;  Surgeon: Reyes JONETTA Budge, MD;  Location: [facility];  Service: Neurosurgery;  Laterality: Left;  LEFT ulnar nerve decompression    Family History  Problem Relation Age of Onset   Hypertension Mother    Heart attack Mother    Alcohol  abuse Father    Hypertension Father    Heart attack Father    Heart attack Other    Cancer Other    Heart failure Other    Anesthesia problems Neg Hx    Hypotension Neg Hx    Malignant hyperthermia Neg Hx    Pseudochol deficiency Neg Hx    Breast cancer Neg Hx     Social History   Socioeconomic History   Marital status: Single    Spouse name: Not on file   Number of children: 3   Years of education: Not on file   Highest education level: 10th grade  Occupational History   Occupation: disabled  Tobacco Use   Smoking status: Former    Current packs/day: 0.00    Average packs/day: 0.5 packs/day for 30.0 years (15.0 ttl pk-yrs)    Types: Cigarettes    Start date: 03/1990    Quit date: 03/2020    Years since quitting: 3.5   Smokeless tobacco: Never  Vaping Use   Vaping status: Never Used  Substance and Sexual Activity   Alcohol  use: Yes    Alcohol /week: 2.0 standard drinks of alcohol      Types: 1 Cans of beer, 1 Standard drinks or equivalent per week    Comment: seldom   Drug use: No   Sexual activity: Not Currently  Other Topics Concern   Not on file  Social History Narrative   One level home with boyfriend   Caffeine - coffee 1-2 cups/day; Green tea 4-5 bottles a day   Exercise - some    Right handed      Social Drivers of Health   Financial Resource Strain: Low Risk  (04/05/2023)   Overall Financial Resource Strain (CARDIA)    Difficulty of Paying Living Expenses: Not very hard  Food Insecurity: No Food Insecurity (04/05/2023)   Hunger Vital Sign    Worried About Running Out of Food in the Last Year: Never true    Ran Out of Food in the Last Year: Never true  Transportation Needs: Unmet Transportation  Needs (04/05/2023)   PRAPARE - Administrator, Civil Service (Medical): Yes    Lack of Transportation (Non-Medical): No  Physical Activity: Insufficiently Active (04/05/2023)   Exercise Vital Sign    Days of Exercise per Week: 3 days    Minutes of Exercise per Session: 20 min  Stress: Stress Concern Present (04/05/2023)   [employer] of Occupational Health - Occupational Stress Questionnaire    Feeling of Stress : Very much  Social Connections: Moderately Isolated (04/05/2023)   Social Connection and Isolation Panel [NHANES]    Frequency of Communication with Friends and Family: More than three times a week    Frequency of Social Gatherings with Friends and Family: More than three times a week    Attends Religious Services: 1 to 4 times per year    Active Member of [employer] or Organizations: No    Attends Banker Meetings: Never    Marital Status: Divorced  Catering Manager Violence: Not At Risk (12/31/2022)   Humiliation, Afraid, Rape, and Kick questionnaire    Fear of Current or Ex-Partner: No    Emotionally Abused: No    Physically Abused: No    Sexually Abused: No    Outpatient Medications Prior to Visit  Medication Sig  Dispense Refill   amoxicillin -clavulanate (AUGMENTIN ) 875-125 MG tablet Take 1 tablet by mouth 2 (two) times daily. 20 tablet 0   ARIPiprazole  (ABILIFY ) 5 MG tablet TAKE 1 TABLET(5 MG) BY MOUTH DAILY 30 tablet 2   aspirin  81 MG tablet Take 81 mg by mouth daily.     BREO ELLIPTA  100-25 MCG/ACT AEPB INHALE 1 PUFF INTO THE LUNGS DAILY. 60 each 10   Buprenorphine  HCl (BELBUCA ) 900 MCG FILM Place 900 mcg inside cheek every 12 (twelve) hours.     carvedilol  (COREG ) 3.125 MG tablet TAKE ONE (1) TABLET BY MOUTH TWICE DAILY WITH MEALS (Patient taking differently: Take 3.125 mg by mouth 2 (two) times daily with a meal.) 60 tablet 5   denosumab  (PROLIA ) 60 MG/ML SOSY injection Inject 60 mg into the skin every 6 (six) months. 180 mL 2   diclofenac  Sodium (VOLTAREN ) 1 % GEL Apply 2 g topically 4 (four) times daily. 50 g 4   EPINEPHRINE  0.3 mg/0.3 mL IJ SOAJ injection Inject 0.3 mLs (0.3 mg total) into the muscle as needed for anaphylaxis. 1 each 2   esomeprazole  (NEXIUM ) 20 MG capsule TAKE ONE (1) CAPSULE BY MOUTH ONCE DAILY 90 capsule 1   fluticasone  (FLONASE ) 50 MCG/ACT nasal spray USE 2 SPRAYS IN EACH NOSTRILS DAILY AS NEEDED (Patient taking differently: Place 2 sprays into both nostrils daily as needed for allergies or rhinitis.) 48 g 0   furosemide  (LASIX ) 40 MG tablet Take 1 tablet (40 mg total) by mouth daily. 90 tablet 3   gabapentin  (NEURONTIN ) 800 MG tablet Take 1 tablet (800 mg total) by mouth 3 (three) times daily. Patient usually takes twice a day 270 tablet 2   Galcanezumab -gnlm (EMGALITY ) 120 MG/ML SOSY Inject 120 mg into the skin every 30 (thirty) days. 3 mL 0   ipratropium-albuterol  (DUONEB) 0.5-2.5 (3) MG/3ML SOLN Use 1 vial in nebulizer every 6 (six) hours as needed for shortness of breath (Patient taking differently: Take 3 mLs by nebulization every 6 (six) hours as needed (SOB).) 3 mL 11   levocetirizine (XYZAL) 5 MG tablet Take 5 mg by mouth daily as needed for allergies.     lubiprostone   (AMITIZA ) 24 MCG capsule TAKE 1 CAPSULE(24 MCG)  BY MOUTH TWICE DAILY WITH A MEAL 180 capsule 1   megestrol  (MEGACE ) 20 MG tablet Take 1 tablet (20 mg total) by mouth daily. 90 tablet 2   Multiple Vitamin (MULTIVITAMIN WITH MINERALS) TABS tablet Take 1 tablet by mouth daily.     naloxone  (NARCAN ) nasal spray 4 mg/0.1 mL Place 1 spray into the nose once.     nitroGLYCERIN  (NITROSTAT ) 0.4 MG SL tablet Place 1 tablet (0.4 mg total) under the tongue every 5 (five) minutes as needed for chest pain. 25 tablet 6   ondansetron  (ZOFRAN ) 4 MG tablet Take 1 tablet (4 mg total) by mouth every 8 (eight) hours as needed for nausea or vomiting. 20 tablet 0   oxyCODONE  (ROXICODONE ) 15 MG immediate release tablet Take 15 mg by mouth 3 (three) times daily as needed for pain.     ranolazine  (RANEXA ) 500 MG 12 hr tablet Take 1 tablet (500 mg total) by mouth 2 (two) times daily. TAKE ONE (1) TABLET BY MOUTH TWICE DAILY 60 tablet 4   rosuvastatin  (CRESTOR ) 20 MG tablet TAKE 1 TABLET BY MOUTH ONCE DAILY 90 tablet 1   rosuvastatin  (CRESTOR ) 40 MG tablet TAKE 1 TABLET(40 MG) BY MOUTH DAILY 90 tablet 1   sacubitril -valsartan  (ENTRESTO ) 24-26 MG TAKE ONE (1) TABLET BY MOUTH TWICE DAILY (Patient taking differently: Take 1 tablet by mouth 2 (two) times daily.) 60 tablet 5   sertraline  (ZOLOFT ) 100 MG tablet TAKE 1/2 TABLET(50 MG) BY MOUTH DAILY 45 tablet 1   tiZANidine  (ZANAFLEX ) 4 MG tablet Take 4 mg by mouth at bedtime.     topiramate  (TOPAMAX ) 50 MG tablet TAKE TWO (2) TABLETS BY MOUTH EVERY DAY AT BEDTIME (Patient taking differently: Take 100 mg by mouth at bedtime. TAKE TWO (2) TABLETS BY MOUTH EVERY DAY AT BEDTIME) 60 tablet 5   Ubrogepant  (UBRELVY ) 100 MG TABS Take 1 tablet (100 mg total) by mouth as needed (May repeat after 2 hours.  Maximum 2 tablets in 24 hours). TAKE 1 TABLET BY MOUTH BY MOUTH AS NEEDED( MAY REPEAT 1 TABLET AFTER 2 HOURS IF NEEDED, MAXIMUM 2 TABLETS IN 24 HOURS) Strength: 100 mg 48 tablet 1   Vitamin D ,  Ergocalciferol , (DRISDOL ) 1.25 MG (50000 UNIT) CAPS capsule TAKE 1 CAPSULE BY MOUTH EVERY 7 DAYS FOR 12 DOSES (Patient taking differently: Take 50,000 Units by mouth every 7 (seven) days.) 12 capsule 0   No facility-administered medications prior to visit.    No Known Allergies  Review of Systems  Constitutional:  Negative for appetite change, fatigue and fever.  HENT:  Negative for congestion, ear pain, sinus pressure and sore throat.   Respiratory:  Negative for cough, chest tightness, shortness of breath and wheezing.   Cardiovascular:  Negative for chest pain and palpitations.  Gastrointestinal:  Negative for abdominal pain, constipation, diarrhea, nausea and vomiting.  Genitourinary:  Negative for dysuria and hematuria.  Musculoskeletal:  Negative for arthralgias, back pain, joint swelling and myalgias.  Skin:  Negative for rash.  Neurological:  Negative for dizziness, weakness and headaches.  Psychiatric/Behavioral:  Negative for dysphoric mood. The patient is not nervous/anxious.        Objective:        09/25/2023    2:07 PM 06/25/2023    3:57 PM 06/23/2023    2:35 PM  Vitals with BMI  Height 5' 1.5 5' 0 5' 1.5  Weight 150 lbs 160 lbs 10 oz 160 lbs  BMI 27.89 31.37 29.75  Systolic 128 138 877  Diastolic 86 84 68  Pulse 85 65 89    No data found.   Physical Exam  Health Maintenance Due  Topic Date Due   Zoster Vaccines- Shingrix (1 of 2) Never done   Medicare Annual Wellness (AWV)  10/11/2023    There are no preventive care reminders to display for this patient.   Lab Results  Component Value Date   TSH 0.915 09/25/2023   Lab Results  Component Value Date   WBC 9.6 09/25/2023   HGB 12.7 09/25/2023   HCT 37.5 09/25/2023   MCV 87 09/25/2023   PLT 314 09/25/2023   Lab Results  Component Value Date   NA 139 09/25/2023   K 4.6 09/25/2023   CO2 16 (L) 09/25/2023   GLUCOSE 131 (H) 09/25/2023   BUN 32 (H) 09/25/2023   CREATININE 1.76 (H)  09/25/2023   BILITOT 0.3 09/25/2023   ALKPHOS 78 09/25/2023   AST 12 09/25/2023   ALT 7 09/25/2023   PROT 6.5 09/25/2023   ALBUMIN  4.3 09/25/2023   CALCIUM  9.9 09/25/2023   ANIONGAP 9 07/03/2022   EGFR 31 (L) 09/25/2023   Lab Results  Component Value Date   CHOL 231 (H) 09/25/2023   Lab Results  Component Value Date   HDL 87 09/25/2023   Lab Results  Component Value Date   LDLCALC 124 (H) 09/25/2023   Lab Results  Component Value Date   TRIG 117 09/25/2023   Lab Results  Component Value Date   CHOLHDL 2.7 09/25/2023   Lab Results  Component Value Date   HGBA1C 5.5 06/23/2023       Assessment & Plan:  There are no diagnoses linked to this encounter.   No orders of the defined types were placed in this encounter.   No orders of the defined types were placed in this encounter.    Follow-up: No follow-ups on file.  An After Visit Summary was printed and given to the patient.  Harrie CHRISTELLA Cedar, FNP [facility] [phone]

## 2023-10-02 NOTE — Assessment & Plan Note (Addendum)
-  Administer DuoNeb nebulizer treatment in office today. -Prescribe DuoNeb for home use with nebulizer every 6 hours as needed for shortness of breath. -Continue using Breo inhaler. -Consider use of home oxygen  if shortness of breath persists. Inflammation Likely secondary to pneumonia and COPD exacerbation. -Prescribe Prednisone  40mg  daily for 5 days to reduce inflammation. Educated on when to go to the ED

## 2023-10-05 ENCOUNTER — Other Ambulatory Visit: Payer: Self-pay | Admitting: Family Medicine

## 2023-10-05 DIAGNOSIS — J441 Chronic obstructive pulmonary disease with (acute) exacerbation: Secondary | ICD-10-CM

## 2023-10-07 ENCOUNTER — Other Ambulatory Visit: Payer: Self-pay | Admitting: Family Medicine

## 2023-10-07 ENCOUNTER — Telehealth: Payer: Self-pay

## 2023-10-07 DIAGNOSIS — F331 Major depressive disorder, recurrent, moderate: Secondary | ICD-10-CM

## 2023-10-07 DIAGNOSIS — J189 Pneumonia, unspecified organism: Secondary | ICD-10-CM

## 2023-10-07 NOTE — Telephone Encounter (Signed)
Copied from CRM [id number]. Topic: Clinical - Medication Question >> Oct 07, 2023 11:38 AM Victorino Dike T wrote: Reason for CRM: out of the prednisone and zithromax but not completely better, wants to know if she can get them both refilled, please call [phone]

## 2023-10-08 ENCOUNTER — Other Ambulatory Visit: Payer: Self-pay | Admitting: Family Medicine

## 2023-10-08 DIAGNOSIS — J189 Pneumonia, unspecified organism: Secondary | ICD-10-CM

## 2023-10-08 NOTE — Telephone Encounter (Unsigned)
Copied from CRM [id number]. Topic: Clinical - Medication Refill >> Oct 08, 2023 12:24 PM Herbert Seta B wrote: Most Recent Primary Care Visit:  Provider: [name]  Department: [facility]  Visit Type: ACUTE  Date: 10/02/2023  Medication: azithromycin (ZITHROMAX) 250 MG tablet [[id number]]  Has the patient contacted their pharmacy? No-wants to know if PCP will write a refill   (Agent: If no, request that the patient contact the pharmacy for the refill. If patient does not wish to contact the pharmacy document the reason why and proceed with request.) (Agent: If yes, when and what did the pharmacy advise?)  Is this the correct pharmacy for this prescription? Yes If no, delete pharmacy and type the correct one.  This is the patient's preferred pharmacy:  [id number] Rosalita Levan, [place] - [address] AT [facility] [address] [place] [address] Phone: [phone] Fax: [phone]   Has the prescription been filled recently? yes  Is the patient out of the medication? yes  Has the patient been seen for an appointment in the last year OR does the patient have an upcoming appointment? yes  Can we respond through MyChart? yes  Agent: Please be advised that Rx refills may take up to 3 business days. We ask that you follow-up with your pharmacy.

## 2023-10-09 NOTE — Telephone Encounter (Signed)
Called patient and she states that she will try to get her daughter to [facility] the [facility] [place] to get a chest xray. I told the patient we will call her when we get her results.

## 2023-10-09 NOTE — Addendum Note (Signed)
Addended by: Precious Reel on: 10/09/2023 11:12 AM   Modules accepted: Orders

## 2023-10-10 ENCOUNTER — Ambulatory Visit (HOSPITAL_BASED_OUTPATIENT_CLINIC_OR_DEPARTMENT_OTHER)
Admission: RE | Admit: 2023-10-10 | Discharge: 2023-10-10 | Disposition: A | Discharge status: Home / Self Care | Payer: 59 | Source: Ambulatory Visit | Attending: Family Medicine | Admitting: Family Medicine

## 2023-10-10 DIAGNOSIS — R059 Cough, unspecified: Secondary | ICD-10-CM | POA: Diagnosis not present

## 2023-10-10 DIAGNOSIS — R0602 Shortness of breath: Secondary | ICD-10-CM

## 2023-10-10 DIAGNOSIS — R0989 Other specified symptoms and signs involving the circulatory and respiratory systems: Secondary | ICD-10-CM | POA: Diagnosis not present

## 2023-10-10 DIAGNOSIS — Z950 Presence of cardiac pacemaker: Secondary | ICD-10-CM | POA: Diagnosis not present

## 2023-10-12 ENCOUNTER — Telehealth: Payer: Self-pay

## 2023-10-12 NOTE — Telephone Encounter (Signed)
 Copied from CRM [id number]. Topic: Clinical - Lab/Test Results >> Oct 10, 2023 12:58 PM Ivette P wrote: Reason for CRM: Pt called in to follow up on imaging performed earlier today 02/14. Patient Sharing not release yet. Advised someone will reach out once results are ready. Pt understood. Pt would like a call as soon as results are ready. Possibly before the weekend, Number [phone]

## 2023-10-13 DIAGNOSIS — J449 Chronic obstructive pulmonary disease, unspecified: Secondary | ICD-10-CM | POA: Diagnosis not present

## 2023-10-13 NOTE — Telephone Encounter (Signed)
 I have been calling since Friday and the option #3 " it said the system does not recognize it"

## 2023-10-14 ENCOUNTER — Other Ambulatory Visit: Payer: Self-pay | Admitting: Family Medicine

## 2023-10-14 DIAGNOSIS — J441 Chronic obstructive pulmonary disease with (acute) exacerbation: Secondary | ICD-10-CM

## 2023-10-15 NOTE — Progress Notes (Signed)
 Remote ICD transmission.

## 2023-10-16 ENCOUNTER — Other Ambulatory Visit: Payer: Self-pay | Admitting: Family Medicine

## 2023-10-16 ENCOUNTER — Ambulatory Visit: Payer: Self-pay | Admitting: Family Medicine

## 2023-10-16 DIAGNOSIS — J441 Chronic obstructive pulmonary disease with (acute) exacerbation: Secondary | ICD-10-CM

## 2023-10-16 MED ORDER — GUAIFENESIN-CODEINE 100-10 MG/5ML PO SOLN: 5.0000 mL | Freq: Three times a day (TID) | ORAL | 0 refills | Status: DC | PRN

## 2023-10-16 MED ORDER — PREDNISONE 20 MG PO TABS: ORAL_TABLET | ORAL | 0 refills | Status: DC

## 2023-10-16 NOTE — Telephone Encounter (Signed)
 Chief Complaint: chest pain, SOB Symptoms: chest pain, SOB, cough Frequency: one month sick with pneumonia, chest pain since last night Pertinent Negatives: Patient denies N/V, diaphoresis Disposition: [x] ED /[] Urgent Care (no appt availability in office) / [] Appointment(In office/virtual)/ []  [facility]/ [] Home Care/ [] Refused Recommended Disposition /[] [facility]/ []  Follow-up with PCP Additional Notes: Pt reports CP since last night. Pt states she has been sick with pneumonia for a month. Pt was seen on 2/6 with Lajuana Matte and prescribed a duoneb for home use, prednisone, and antibiotics. Pt states the medications helped "a little bit." Pt states the CP today radiates to her back her "left lung." Pt rates the pain a 9/10. Pt states pain is worse with deep inspiration and that she is having breathing difficulty because taking a deep breath is so painful. Pt endorses dizziness and weakness but denies N/V and diaphoresis. Pt still has a cough that is only sometimes productive with clear mucus. Per protocol and given pt's symptoms and cardiac hx, RN advised pt she needs to go to the ED and RN told pt she would call 911. Pt refused EMS and said "I don't want them coming here. I am scared and all alone." RN educated pt on why EMS would be safe idea (EKG en route, vital signs, O2 en route), pt still refused. Pt said she will call her daughter and have her take her. States daughter lives closeby. Pt states she thinks her CP is from her pneumonia but RN advised pt that tests have to be done to rule out a cardiac cause. RN advised pt she needs to call 911 if her CP gets worse or if she becomes more SOB or becomes diaphoretic or vomits, to which she said she would do.   Copied from CRM [id number]. Topic: Clinical - Red Word Triage >> Oct 16, 2023 11:20 AM Fonda Kinder J wrote: Red Word that prompted transfer to Nurse Triage: Severe cough/pain in left side.the patient had pneumonia Reason for  Disposition  [1] Chest pain lasts > 5 minutes AND [2] age > 31  Answer Assessment - Initial Assessment Questions 1. LOCATION: "Where does it hurt?"       Left chest/side - "my lung" 2. RADIATION: "Does the pain go anywhere else?" (e.g., into neck, jaw, arms, back)     Into my back 3. ONSET: "When did the chest pain begin?" (Minutes, hours or days)      Sick for one month w/ pneumonia - CP started last night 4. PATTERN: "Does the pain come and go, or has it been constant since it started?"  "Does it get worse with exertion?"      Constant 5. DURATION: "How long does it last" (e.g., seconds, minutes, hours)     Constant  6. SEVERITY: "How bad is the pain?"  (e.g., Scale 1-10; mild, moderate, or severe)    - MILD (1-3): doesn't interfere with normal activities     - MODERATE (4-7): interferes with normal activities or awakens from sleep    - SEVERE (8-10): excruciating pain, unable to do any normal activities       9/10  7. CARDIAC RISK FACTORS: "Do you have any history of heart problems or risk factors for heart disease?" (e.g., angina, prior heart attack; diabetes, high blood pressure, high cholesterol, smoker, or strong family history of heart disease)     Heart failure, CAD, cardiomyopathy 8. PULMONARY RISK FACTORS: "Do you have any history of lung disease?"  (e.g., blood  clots in lung, asthma, emphysema, birth control pills)     COPD 9. CAUSE: "What do you think is causing the chest pain?"     Cough, pneumonia 10. OTHER SYMPTOMS: "Do you have any other symptoms?" (e.g., dizziness, nausea, vomiting, sweating, fever, difficulty breathing, cough)       Cough (sometimes dry, sometimes productive - clear mucus), weakness, dizziness, CP is worse when she takes a deep breath, states she has difficulty breathing because she can't take a deep breath, no SOB at rest  Protocols used: Chest Pain-A-AH

## 2023-10-17 ENCOUNTER — Ambulatory Visit (HOSPITAL_BASED_OUTPATIENT_CLINIC_OR_DEPARTMENT_OTHER)
Admission: RE | Admit: 2023-10-17 | Discharge: 2023-10-17 | Disposition: A | Discharge status: Home / Self Care | Payer: 59 | Source: Ambulatory Visit | Attending: Family Medicine | Admitting: Family Medicine

## 2023-10-17 DIAGNOSIS — R059 Cough, unspecified: Secondary | ICD-10-CM | POA: Diagnosis not present

## 2023-10-17 DIAGNOSIS — R0602 Shortness of breath: Secondary | ICD-10-CM | POA: Diagnosis not present

## 2023-10-17 DIAGNOSIS — I700 Atherosclerosis of aorta: Secondary | ICD-10-CM | POA: Diagnosis not present

## 2023-10-17 DIAGNOSIS — J441 Chronic obstructive pulmonary disease with (acute) exacerbation: Secondary | ICD-10-CM

## 2023-10-20 ENCOUNTER — Ambulatory Visit: Payer: 59 | Attending: Cardiology | Admitting: Cardiology

## 2023-10-20 ENCOUNTER — Encounter: Payer: Self-pay | Admitting: Cardiology

## 2023-10-20 VITALS — BP 112/72 | HR 77 | Ht 61.5 in | Wt 152.2 lb

## 2023-10-20 DIAGNOSIS — I2510 Atherosclerotic heart disease of native coronary artery without angina pectoris: Secondary | ICD-10-CM | POA: Diagnosis not present

## 2023-10-20 DIAGNOSIS — I10 Essential (primary) hypertension: Secondary | ICD-10-CM

## 2023-10-20 DIAGNOSIS — I420 Dilated cardiomyopathy: Secondary | ICD-10-CM | POA: Diagnosis not present

## 2023-10-20 DIAGNOSIS — I5022 Chronic systolic (congestive) heart failure: Secondary | ICD-10-CM | POA: Diagnosis not present

## 2023-10-20 DIAGNOSIS — I493 Ventricular premature depolarization: Secondary | ICD-10-CM

## 2023-10-20 LAB — CUP PACEART INCLINIC DEVICE CHECK
Battery Remaining Longevity: 83 mo
Battery Voltage: 3 V
Brady Statistic AP VP Percent: 0 %
Brady Statistic AP VS Percent: 3.67 %
Brady Statistic AS VP Percent: 0.03 %
Brady Statistic AS VS Percent: 96.3 %
Brady Statistic RA Percent Paced: 3.65 %
Brady Statistic RV Percent Paced: 0.04 %
Date Time Interrogation Session: 20250224165741
HighPow Impedance: 69 Ohm
Implantable Lead Connection Status: 753985
Implantable Lead Connection Status: 753985
Implantable Lead Implant Date: 20120502
Implantable Lead Implant Date: 20120502
Implantable Lead Location: 753859
Implantable Lead Location: 753860
Implantable Lead Model: 4076
Implantable Lead Model: 7122
Implantable Pulse Generator Implant Date: 20200925
Lead Channel Impedance Value: 361 Ohm
Lead Channel Impedance Value: 646 Ohm
Lead Channel Impedance Value: 779 Ohm
Lead Channel Pacing Threshold Amplitude: 0.5 V
Lead Channel Pacing Threshold Amplitude: 0.75 V
Lead Channel Pacing Threshold Pulse Width: 0.4 ms
Lead Channel Pacing Threshold Pulse Width: 0.4 ms
Lead Channel Sensing Intrinsic Amplitude: 10 mV
Lead Channel Sensing Intrinsic Amplitude: 13.625 mV
Lead Channel Sensing Intrinsic Amplitude: 2.75 mV
Lead Channel Sensing Intrinsic Amplitude: 2.875 mV
Lead Channel Setting Pacing Amplitude: 1.5 V
Lead Channel Setting Pacing Amplitude: 2 V
Lead Channel Setting Pacing Pulse Width: 0.4 ms
Lead Channel Setting Sensing Sensitivity: 0.3 mV
Zone Setting Status: 755011
Zone Setting Status: 755011

## 2023-10-20 NOTE — Progress Notes (Signed)
  Electrophysiology Office Note:   Date:  10/20/2023  ID:  [name], DOB March 21, 1956, MRN [id number]  Primary Cardiologist: Gypsy Balsam, MD Primary Heart Failure: None Electrophysiologist: Harles Evetts Jorja Loa, MD      History of Present Illness:   [name] is a 68 y.o. female with h/o coronary artery disease post MI, chronic systolic heart failure due to ischemic cardiomyopathy, PVCs, hypertension, hyperlipidemia seen today for routine electrophysiology followup.   Since last being seen in our clinic the patient reports intermittent shortness of breath and lower extremity edema.  She finds it at times difficult to do her daily activities due to her shortness of breath.  Based on device interrogation, she is having an elevated OptiVol with intermittent fluid retention.  She has noted increasing lower extremity edema intermittently over the last few months as well.  She does not weigh herself.  she denies chest pain, palpitations, PND, orthopnea, nausea, vomiting, dizziness, syncope, weight gain, or early satiety.   Review of systems complete and found to be negative unless listed in HPI.      EP Information / Studies Reviewed:    EKG is ordered today. Personal review as below.  EKG Interpretation Date/Time:  Monday October 20 2023 16:01:06 EST Ventricular Rate:  77 PR Interval:  184 QRS Duration:  114 QT Interval:  366 QTC Calculation: 414 R Axis:   -19  Text Interpretation: Normal sinus rhythm Incomplete right bundle branch block Borderline ECG When compared with ECG of 03-Jul-2022 17:41, No significant change since last tracing Confirmed by Xavier Munger ([id number]) on 10/20/2023 4:10:11 PM   ICD Interrogation-  reviewed in detail today,  See PACEART report.  Device History: Medtronic BiV ICD implanted 05/21/2019 for chronic systolic heart failure History of appropriate therapy: No History of AAD therapy: No   Risk Assessment/Calculations:             Physical  Exam:   VS:  Pulse 77   Ht 5' 1.5" (1.562 m)   Wt 152 lb 3.2 oz (69 kg)   BMI 28.29 kg/m    Wt Readings from Last 3 Encounters:  10/20/23 152 lb 3.2 oz (69 kg)  10/02/23 148 lb (67.1 kg)  09/25/23 150 lb (68 kg)     GEN: Well nourished, well developed in no acute distress NECK: No JVD; No carotid bruits CARDIAC: Regular rate and rhythm, no murmurs, rubs, gallops RESPIRATORY:  Clear to auscultation without rales, wheezing or rhonchi  ABDOMEN: Soft, non-tender, non-distended EXTREMITIES:  1+ edema; No deformity   ASSESSMENT AND PLAN:    Chronic systolic dysfunction s/p Medtronic CRT-D  euvolemic today Stable on an appropriate medical regimen Normal ICD function See Pace Art report Sensing, threshold, impedance within normal limits and stable for the patient She has 1+ lower extremity edema and shortness of breath.  Her ICD shows that she is retaining fluid.  Sakara Lehtinen increase Lasix to 40 mg twice daily for 3 days then back to daily.  She Ahmon Tosi weigh herself on a daily basis after 3 days.  Cung Masterson arrange for her to follow-up in ICM clinic.  2.  Coronary artery disease: Has chronic stable angina.  Plan per primary cardiology.  3.  PVCs: 5.6 burden on cardiac monitor.  Continue with current management.  Patient is asymptomatic.  4.  Hypertension: Currently well-controlled  Disposition:   Follow up with Dr. Elberta Fortis in 12 months   Signed, [place] Gipe Jorja Loa, MD

## 2023-10-20 NOTE — Patient Instructions (Signed)
 Medication Instructions:  Your physician has recommended you make the following change in your medication:  INCREASE Lasix to 40 mg TWICE daily for 3 days, then return to normal daily dosing  *If you need a refill on your cardiac medications before your next appointment, please call your pharmacy*   Lab Work: None ordered   Testing/Procedures: None ordered   Follow-Up: At [facility], you and your health needs are our priority.  As part of our continuing mission to provide you with exceptional heart care, we have created designated Provider Care Teams.  These Care Teams include your primary Cardiologist (physician) and Advanced Practice Providers (APPs -  Physician Assistants and Nurse Practitioners) who all work together to provide you with the care you need, when you need it.  Your next appointment:   1 year(s)  The format for your next appointment:   In Person  Provider:   Loman Brooklyn, MD    Thank you for choosing [facility]!!   Dory Horn, RN [phone]

## 2023-10-21 ENCOUNTER — Telehealth: Payer: Self-pay

## 2023-10-21 NOTE — Telephone Encounter (Signed)
 Referred to [facility] clinic by Dr Elberta Fortis.   Attempted call to patient for ICM intro and left message for return call.   Pt seen by Dr Elberta Fortis 2/24.  Had 1+ LE edema and SOB.  Dr Elberta Fortis advised to increase Lasix to 40 mg twice a day x 3 days and then back to daily.  Also advise to weigh daily.   ICM remote scheduled for 10/27/2023 to recheck fluid levels after office visit.

## 2023-10-21 NOTE — Telephone Encounter (Addendum)
-----   Message from Nurse Richarda Osmond sent at 10/20/2023  4:19 PM EST ----- Regarding: ICM referral Got another one for you.   Dr. Elberta Fortis and I would like to refer for fluid management.  Thank you friend! Boneta Lucks RN

## 2023-10-21 NOTE — Telephone Encounter (Signed)
 Copied from CRM [id number]. Topic: Medical Record Request - Other >> Oct 21, 2023  9:57 AM Thomes Dinning wrote: Reason for CRM: Gavin Pound from [facility] states they have sent over a form for the provider to complete in order for the patient to continue to get incontinence supplies. They have not recv'd it back, Dborah states she will resend the info and it needs to be sent back to: [phone] alt fax [phone]

## 2023-10-22 ENCOUNTER — Other Ambulatory Visit: Payer: Self-pay | Admitting: Family Medicine

## 2023-10-22 ENCOUNTER — Telehealth: Payer: Self-pay

## 2023-10-22 DIAGNOSIS — N289 Disorder of kidney and ureter, unspecified: Secondary | ICD-10-CM

## 2023-10-22 DIAGNOSIS — J441 Chronic obstructive pulmonary disease with (acute) exacerbation: Secondary | ICD-10-CM

## 2023-10-22 MED ORDER — DEXTROMETHORPHAN HBR 15 MG/5ML PO SYRP: 10.0000 mL | ORAL_SOLUTION | Freq: Four times a day (QID) | ORAL | 0 refills | Status: DC | PRN

## 2023-10-22 NOTE — Telephone Encounter (Signed)
 Patient informed that an alternative cough medication has been sent to the pharmacy. Agreed to CT of abd/pelvis. Verbalized understanding.

## 2023-10-22 NOTE — Telephone Encounter (Signed)
 Patient returned call, informed her of her CT results. She states she can not have an MRI, "I have a box in my chest". She also stated her pharmacy does not carry the cough medication sent in and requested a different rx be sent.

## 2023-10-22 NOTE — Telephone Encounter (Signed)
 Called patient and let her a voicemail to call us back and set up an appointment for chronic visit and also discuss the results of the CT scan.

## 2023-10-24 DIAGNOSIS — M51369 Other intervertebral disc degeneration, lumbar region without mention of lumbar back pain or lower extremity pain: Secondary | ICD-10-CM | POA: Diagnosis not present

## 2023-10-24 DIAGNOSIS — M542 Cervicalgia: Secondary | ICD-10-CM | POA: Diagnosis not present

## 2023-10-24 DIAGNOSIS — M549 Dorsalgia, unspecified: Secondary | ICD-10-CM | POA: Diagnosis not present

## 2023-10-24 DIAGNOSIS — M5030 Other cervical disc degeneration, unspecified cervical region: Secondary | ICD-10-CM | POA: Diagnosis not present

## 2023-10-24 DIAGNOSIS — G8929 Other chronic pain: Secondary | ICD-10-CM | POA: Diagnosis not present

## 2023-10-24 DIAGNOSIS — M419 Scoliosis, unspecified: Secondary | ICD-10-CM | POA: Diagnosis not present

## 2023-10-24 DIAGNOSIS — Z79899 Other long term (current) drug therapy: Secondary | ICD-10-CM | POA: Diagnosis not present

## 2023-10-27 ENCOUNTER — Other Ambulatory Visit: Payer: Self-pay | Admitting: Cardiology

## 2023-10-27 ENCOUNTER — Ambulatory Visit: Payer: 59 | Attending: Cardiology

## 2023-10-27 DIAGNOSIS — Z95810 Presence of automatic (implantable) cardiac defibrillator: Secondary | ICD-10-CM

## 2023-10-27 DIAGNOSIS — I5022 Chronic systolic (congestive) heart failure: Secondary | ICD-10-CM

## 2023-10-27 NOTE — Telephone Encounter (Signed)
 Attempted ICM referral Call. Left message for return call.

## 2023-10-27 NOTE — Progress Notes (Unsigned)
 EPIC Encounter for ICM Monitoring  Patient Name: [name] is a 68 y.o. female Date: 10/27/2023 Primary Care Physican: [name], FNP Primary Cardiologist: Bing Matter Electrophysiologist: Elberta Fortis 10/27/2023 Weight: 152 lbs (baseline 150 lbs)  Time in AT/AF <0.1 hr/day (<0.1%)      1st ICM Remote Transmission.  Heart Failure questions reviewed.  Pt had minimal improvement in leg swelling (after taking Lasix 40 mg bid x 3 days) weight is up 2 lbs from baseline and continues to have difficulty taking in a deep breath.  She is having CT scan of kidneys today.  Diet: Frequently eats restaurants foods.  Unsure of amount of fluid intake but drinks water most of the day.  Education provided regarding salt content of restaurant foods and try to limit fluid intake to 64 oz daily  2/24 OV note with Dr Elberta Fortis reviewed and pt has noted increasing lower extremity edema intermittently over the last few months.  Presented with 1+ edema lower extremity and SOB at visit.   Optivol thoracic impedance suggesting possible fluid accumulation starting 2/10.   Prescribed:  Furosemide 40 mg take 1 tablet(s) (40 mg total) by mouth every morning.  Confirmed 3/4 she is taking 40 mg daily.  Labs: 09/25/2023 Creatinine 1.76, BUN 32, Potassium 4.6, Sodium 139, GFR 31  A complete set of results can be found in Results Review.  Recommendations:  At 2/24 OV, Dr Elberta Fortis recommended patient take Lasix 40 mg bid x 3 days resulting in minimal change in sx or Optivol.  Copy sent to Dr Bing Matter for review and recommendations.   Follow-up plan: ICM clinic phone appointment on 11/03/2023 (manual) to recheck fluid levels.   91 day device clinic remote transmission 12/03/2023.    EP/Cardiology Office Visits: Recall 10/14/2024 with Dr. Elberta Fortis.    Last visit with Dr Bing Matter was 06/25/2023 and recommended to follow up in a month.    Copy of ICM check sent to Dr. Elberta Fortis.    3 month ICM trend: 10/27/2023.    12-14 Month  ICM trend:     Karie Soda, RN 10/27/2023 3:38 PM

## 2023-10-28 DIAGNOSIS — N289 Disorder of kidney and ureter, unspecified: Secondary | ICD-10-CM | POA: Diagnosis not present

## 2023-10-28 DIAGNOSIS — N1330 Unspecified hydronephrosis: Secondary | ICD-10-CM | POA: Diagnosis not present

## 2023-10-28 NOTE — Telephone Encounter (Signed)
Spoke with patient.. See ICM note 

## 2023-10-29 ENCOUNTER — Encounter: Payer: Self-pay | Admitting: Family Medicine

## 2023-10-29 ENCOUNTER — Other Ambulatory Visit: Payer: Self-pay | Admitting: Family Medicine

## 2023-10-29 DIAGNOSIS — Z79899 Other long term (current) drug therapy: Secondary | ICD-10-CM | POA: Diagnosis not present

## 2023-10-29 MED ORDER — POTASSIUM CHLORIDE ER 10 MEQ PO TBCR
30.0000 meq | EXTENDED_RELEASE_TABLET | Freq: Every day | ORAL | 3 refills | Status: DC
Start: 2023-10-29 — End: 2023-12-18

## 2023-10-29 NOTE — Progress Notes (Signed)
 Spoke with patient and provided Dr Gershon Crane recommendations to take Furosemide 40 mg 1 tablet twice a day for 1 week and Potassium 30 mEq for 1 week when taking the Lasix twice a day.   Also Dr Elberta Fortis would like for her to be seen in Dr Vanetta Shawl office for ongoing fluid issues.  She repeated instructions back correctly and agreed with plan.  Advised to call back with any questions and to continue with daily weights.  Potassium prescription send to [facility] as requested.   Message sent to Dr Pearson Forster office scheduler to contact patient for an appt.

## 2023-10-29 NOTE — Progress Notes (Signed)
 Attempted call to patient and unable to reach.  Left message for return call.

## 2023-10-29 NOTE — Progress Notes (Signed)
  Received: Florina Ou, Andree Coss, MD  Bessy Reaney, Josephine Igo, RN; Georgeanna Lea, MD Can increase to BID lasix for one week and 30 meq kdur, needs to see primary cardiology

## 2023-10-31 ENCOUNTER — Other Ambulatory Visit: Payer: Self-pay | Admitting: Family Medicine

## 2023-10-31 ENCOUNTER — Encounter: Payer: Self-pay | Admitting: Family Medicine

## 2023-10-31 DIAGNOSIS — Q6211 Congenital occlusion of ureteropelvic junction: Secondary | ICD-10-CM

## 2023-10-31 DIAGNOSIS — N3289 Other specified disorders of bladder: Secondary | ICD-10-CM

## 2023-11-03 ENCOUNTER — Ambulatory Visit: Attending: Cardiology

## 2023-11-03 DIAGNOSIS — E440 Moderate protein-calorie malnutrition: Secondary | ICD-10-CM

## 2023-11-03 DIAGNOSIS — Z95810 Presence of automatic (implantable) cardiac defibrillator: Secondary | ICD-10-CM

## 2023-11-03 DIAGNOSIS — I5022 Chronic systolic (congestive) heart failure: Secondary | ICD-10-CM

## 2023-11-03 DIAGNOSIS — N3941 Urge incontinence: Secondary | ICD-10-CM | POA: Insufficient documentation

## 2023-11-03 DIAGNOSIS — Q6211 Congenital occlusion of ureteropelvic junction: Secondary | ICD-10-CM | POA: Diagnosis not present

## 2023-11-03 HISTORY — DX: Urge incontinence: N39.41

## 2023-11-03 HISTORY — DX: Moderate protein-calorie malnutrition: E44.0

## 2023-11-03 LAB — LAB REPORT - SCANNED: EGFR: 27

## 2023-11-03 NOTE — Assessment & Plan Note (Signed)
 Needs incontinence supplies. Diapers.

## 2023-11-03 NOTE — Progress Notes (Signed)
 Acute Office Visit  Subjective:    Patient ID: [name], female    DOB: July 19, 1956, 68 y.o.   MRN: [id number]  Chief Complaint  Patient presents with   Left lung pain    Discussed the use of AI scribe software for clinical note transcription with the patient, who gave verbal consent to proceed.   HPI: [name] is a 68 year old female with COPD who presents with persistent left sided pain and shortness of breath.  She experiences persistent chest pain primarily on the left side, which worsens with deep breaths. These symptoms have persisted despite completing a course of antibiotics, including amoxicillin  and Augmentin , for pneumonia diagnosed at [facility] urgent care. She has never had a recent chest CT scan, but a chest x-ray was performed at [facility] urgent care before her last visit.  She has a history of COPD and uses a Breo inhaler regularly, along with an albuterol  inhaler as needed. She has not been using her nebulizer because she can't find hers. She experiences wheezing and difficulty breathing, especially when walking, but can breathe fine at rest. She uses oxygen  at night, prescribed by Dr. Abran, but has not used it recently due to her current condition.  She quit smoking a long time ago and denies any recent exposure to sick contacts. She has been staying at home and has not visited her great grandbaby due to her illness. She denies any allergies and is not diabetic. She reports that her kidney function is not optimal.  Past Medical History:  Diagnosis Date   Abdominal pain 10/17/2020   Abnormality of gait due to impairment of balance 11/22/2020   Acute diverticulitis 10/17/2020   Admission for long-term opiate analgesic use 10/24/2019   Arthritis of right hip 05/29/2016   Formatting of this note might be different from the original. Added automatically from request for surgery [id number]   BMI 26.0-26.9,adult 03/29/2020   Bronchial asthma    Cardiomyopathy  (HCC)    Overview:  Ejection fraction 45% in 2015 Ejection fraction 30 to 35% in November 2018   Chronic back pain    Chronic constipation 07/31/2021   Chronic hypoxemic respiratory failure (HCC) 10/24/2019   Chronic narcotic use 03/23/2015   Chronic pain of both knees 12/21/2018   Added automatically from request for surgery [id number]  Formatting of this note might be different from the original. 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   Hypotension 05/17/2020   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   Ileus following gastrointestinal surgery (HCC) 03/26/2015   Major depressive disorder, single episode, moderate (HCC) 10/24/2019   Malnutrition of moderate degree (HCC) 10/20/2020   Mesenteric ischemia (HCC) 10/16/2020   Migraine without aura with status migrainosus 10/24/2019   Mixed incontinence 10/20/2020   Myocardial infarction (HCC)  Nausea & vomiting    Obstructive chronic bronchitis with exacerbation (HCC) 10/25/2019   Other spondylosis with radiculopathy, lumbar region 10/24/2019   Persistent vomiting 05/17/2020   Presence of left artificial hip joint 10/24/2019   PTSD (post-traumatic stress disorder)    Recurrent  incisional hernias with incarceration s/p lap repair w mesh 03/23/2015 04/19/2014   Senile osteoporosis 10/24/2019   Sepsis (HCC) 10/17/2020   Serosanguineous chronic otitis media of right ear 08/02/2021   Small bowel obstruction (HCC)    Thyroid  disease    Upper respiratory tract infection due to COVID-19 virus 07/06/2021   Wellness examination 03/27/2021    Past Surgical History:  Procedure Laterality Date   APPENDECTOMY     CARDIAC CATHETERIZATION     CARPAL TUNNEL RELEASE     CESAREAN SECTION     CHF s/p AICD   12/2010   CHOLECYSTECTOMY     COLONOSCOPY  10/16/2015   Mild colonic diverticulosis, predominantly in the left colon. Small internal hemrrhoids. Otherwise normal colonoscopy   COLONOSCOPY WITH PROPOFOL  N/A 09/27/2021   Procedure: COLONOSCOPY WITH PROPOFOL ;  Surgeon: Shila Gustav GAILS, MD;  Location: [facility] ENDOSCOPY;  Service: Endoscopy;  Laterality: N/A;   CORONARY ANGIOPLASTY     ESOPHAGOGASTRODUODENOSCOPY  02/04/2014   Mild gastritis. Otherwise normal EGD   HAND SURGERY Bilateral    ICD GENERATOR CHANGEOUT N/A 05/21/2019   Procedure: ICD GENERATOR CHANGEOUT;  Surgeon: Inocencio Soyla Lunger, MD;  Location: [facility] INVASIVE CV LAB;  Service: Cardiovascular;  Laterality: N/A;   ICD IMPLANT     Medtronic   intestinal blockage 2011     LAPAROSCOPIC ASSISTED VENTRAL HERNIA REPAIR N/A 03/23/2015   Procedure: LAPAROSCOPIC VENTRAL WALL HERNIA REPAIR;  Surgeon: Elspeth Schultze, MD;  Location: [facility] ORS;  Service: General;  Laterality: N/A;  With MESH   LAPAROSCOPIC LYSIS OF ADHESIONS N/A 03/23/2015   Procedure: LAPAROSCOPIC LYSIS OF ADHESIONS;  Surgeon: Elspeth Schultze, MD;  Location: [facility] ORS;  Service: General;  Laterality: N/A;   LSCS      x2   NASAL SEPTUM SURGERY     NECK SURGERY     fused   TONSILLECTOMY     TYMPANOSTOMY TUBE PLACEMENT Right 2024   ULNAR NERVE TRANSPOSITION  01/23/2012   Procedure: ULNAR NERVE DECOMPRESSION/TRANSPOSITION;  Surgeon: Reyes JONETTA Budge, MD;  Location: [facility]  NEURO ORS;  Service: Neurosurgery;  Laterality: Left;  LEFT ulnar nerve decompression    Family History  Problem Relation Age of Onset   Hypertension Mother    Heart attack Mother    Alcohol  abuse Father    Hypertension Father    Heart attack Father    Heart attack Other    Cancer Other    Heart failure Other    Anesthesia problems Neg Hx    Hypotension Neg Hx    Malignant hyperthermia Neg Hx    Pseudochol deficiency Neg Hx    Breast cancer Neg Hx     Social History   Socioeconomic History   Marital status: Single    Spouse name: Not on file   Number of children: 3   Years of education: Not on file   Highest education level: 10th grade  Occupational History   Occupation: disabled  Tobacco Use   Smoking status: Former    Current packs/day: 0.00    Average packs/day: 0.5 packs/day for 30.0 years (15.0 ttl pk-yrs)    Types: Cigarettes    Start date: 03/1990    Quit date: 03/2020    Years  since quitting: 3.6   Smokeless tobacco: Never  Vaping Use   Vaping status: Never Used  Substance and Sexual Activity   Alcohol  use: Yes    Alcohol /week: 2.0 standard drinks of alcohol     Types: 1 Cans of beer, 1 Standard drinks or equivalent per week    Comment: seldom   Drug use: No   Sexual activity: Not Currently  Other Topics Concern   Not on file  Social History Narrative   One level home with boyfriend   Caffeine - coffee 1-2 cups/day; Green tea 4-5 bottles a day   Exercise - some    Right handed      Social Drivers of Health   Financial Resource Strain: Low Risk  (04/05/2023)   Overall Financial Resource Strain (CARDIA)    Difficulty of Paying Living Expenses: Not very hard  Food Insecurity: No Food Insecurity (04/05/2023)   Hunger Vital Sign    Worried About Running Out of Food in the Last Year: Never true    Ran Out of Food in the Last Year: Never true  Transportation Needs: Unmet Transportation Needs (04/05/2023)   PRAPARE - Scientist, Research (physical Sciences) (Medical): Yes    Lack of Transportation (Non-Medical): No  Physical Activity: Insufficiently Active (04/05/2023)   Exercise Vital Sign    Days of Exercise per Week: 3 days    Minutes of Exercise per Session: 20 min  Stress: Stress Concern Present (04/05/2023)   [employer] of Occupational Health - Occupational Stress Questionnaire    Feeling of Stress : Very much  Social Connections: Moderately Isolated (04/05/2023)   Social Connection and Isolation Panel [NHANES]    Frequency of Communication with Friends and Family: More than three times a week    Frequency of Social Gatherings with Friends and Family: More than three times a week    Attends Religious Services: 1 to 4 times per year    Active Member of [employer] or Organizations: No    Attends Banker Meetings: Never    Marital Status: Divorced  Catering Manager Violence: Not At Risk (12/31/2022)   Humiliation, Afraid, Rape, and Kick questionnaire    Fear of Current or Ex-Partner: No    Emotionally Abused: No    Physically Abused: No    Sexually Abused: No    Outpatient Medications Prior to Visit  Medication Sig Dispense Refill   aspirin  81 MG tablet Take 81 mg by mouth daily.     BREO ELLIPTA  100-25 MCG/ACT AEPB INHALE 1 PUFF INTO THE LUNGS DAILY. 60 each 10   Buprenorphine  HCl (BELBUCA ) 900 MCG FILM Place 900 mcg inside cheek every 12 (twelve) hours.     carvedilol  (COREG ) 3.125 MG tablet TAKE ONE (1) TABLET BY MOUTH TWICE DAILY WITH MEALS (Patient taking differently: Take 3.125 mg by mouth 2 (two) times daily with a meal.) 60 tablet 5   denosumab  (PROLIA ) 60 MG/ML SOSY injection Inject 60 mg into the skin every 6 (six) months. 180 mL 2   diclofenac  Sodium (VOLTAREN ) 1 % GEL Apply 2 g topically 4 (four) times daily. 50 g 4   EPINEPHRINE  0.3 mg/0.3 mL IJ SOAJ injection Inject 0.3 mLs (0.3 mg total) into the muscle as needed for anaphylaxis. 1 each 2   esomeprazole  (NEXIUM ) 20 MG capsule TAKE ONE (1)  CAPSULE BY MOUTH ONCE DAILY 90 capsule 1   fluticasone  (FLONASE ) 50 MCG/ACT nasal spray USE 2 SPRAYS IN EACH NOSTRILS DAILY AS NEEDED (Patient taking  differently: Place 2 sprays into both nostrils daily as needed for allergies or rhinitis.) 48 g 0   gabapentin  (NEURONTIN ) 800 MG tablet Take 1 tablet (800 mg total) by mouth 3 (three) times daily. Patient usually takes twice a day 270 tablet 2   Galcanezumab -gnlm (EMGALITY ) 120 MG/ML SOSY Inject 120 mg into the skin every 30 (thirty) days. 3 mL 0   levocetirizine (XYZAL) 5 MG tablet Take 5 mg by mouth daily as needed for allergies.     lubiprostone  (AMITIZA ) 24 MCG capsule TAKE 1 CAPSULE(24 MCG) BY MOUTH TWICE DAILY WITH A MEAL 180 capsule 1   megestrol  (MEGACE ) 20 MG tablet Take 1 tablet (20 mg total) by mouth daily. 90 tablet 2   Multiple Vitamin (MULTIVITAMIN WITH MINERALS) TABS tablet Take 1 tablet by mouth daily.     naloxone  (NARCAN ) nasal spray 4 mg/0.1 mL Place 1 spray into the nose once.     nitroGLYCERIN  (NITROSTAT ) 0.4 MG SL tablet Place 1 tablet (0.4 mg total) under the tongue every 5 (five) minutes as needed for chest pain. 25 tablet 6   ondansetron  (ZOFRAN ) 4 MG tablet Take 1 tablet (4 mg total) by mouth every 8 (eight) hours as needed for nausea or vomiting. 20 tablet 0   oxyCODONE  (ROXICODONE ) 15 MG immediate release tablet Take 15 mg by mouth 3 (three) times daily as needed for pain.     rosuvastatin  (CRESTOR ) 20 MG tablet TAKE 1 TABLET BY MOUTH ONCE DAILY 90 tablet 1   sacubitril -valsartan  (ENTRESTO ) 24-26 MG TAKE ONE (1) TABLET BY MOUTH TWICE DAILY (Patient taking differently: Take 1 tablet by mouth 2 (two) times daily.) 60 tablet 5   sertraline  (ZOLOFT ) 100 MG tablet TAKE 1/2 TABLET(50 MG) BY MOUTH DAILY 45 tablet 1   tiZANidine  (ZANAFLEX ) 4 MG tablet Take 4 mg by mouth at bedtime.     topiramate  (TOPAMAX ) 50 MG tablet TAKE TWO (2) TABLETS BY MOUTH EVERY DAY AT BEDTIME (Patient taking differently: Take 100 mg by mouth at bedtime.  TAKE TWO (2) TABLETS BY MOUTH EVERY DAY AT BEDTIME) 60 tablet 5   Ubrogepant  (UBRELVY ) 100 MG TABS Take 1 tablet (100 mg total) by mouth as needed (May repeat after 2 hours.  Maximum 2 tablets in 24 hours). TAKE 1 TABLET BY MOUTH BY MOUTH AS NEEDED( MAY REPEAT 1 TABLET AFTER 2 HOURS IF NEEDED, MAXIMUM 2 TABLETS IN 24 HOURS) Strength: 100 mg 48 tablet 1   Vitamin D , Ergocalciferol , (DRISDOL ) 1.25 MG (50000 UNIT) CAPS capsule TAKE 1 CAPSULE BY MOUTH EVERY 7 DAYS FOR 12 DOSES (Patient taking differently: Take 50,000 Units by mouth every 7 (seven) days.) 12 capsule 0   amoxicillin -clavulanate (AUGMENTIN ) 875-125 MG tablet Take 1 tablet by mouth 2 (two) times daily. 20 tablet 0   ARIPiprazole  (ABILIFY ) 5 MG tablet TAKE 1 TABLET(5 MG) BY MOUTH DAILY 30 tablet 2   ipratropium-albuterol  (DUONEB) 0.5-2.5 (3) MG/3ML SOLN Use 1 vial in nebulizer every 6 (six) hours as needed for shortness of breath (Patient taking differently: Take 3 mLs by nebulization every 6 (six) hours as needed (SOB).) 3 mL 11   ranolazine  (RANEXA ) 500 MG 12 hr tablet Take 1 tablet (500 mg total) by mouth 2 (two) times daily. TAKE ONE (1) TABLET BY MOUTH TWICE DAILY 60 tablet 4   rosuvastatin  (CRESTOR ) 40 MG tablet TAKE 1 TABLET(40 MG) BY MOUTH DAILY (Patient not taking: Reported on 10/20/2023) 90 tablet 1   furosemide  (LASIX ) 40 MG tablet Take 1 tablet (40  mg total) by mouth daily. 90 tablet 3   No facility-administered medications prior to visit.    No Known Allergies  Review of Systems  Constitutional:  Negative for appetite change, fatigue and fever.  HENT:  Negative for congestion, ear pain, sinus pressure and sore throat.   Respiratory:  Negative for cough, chest tightness, shortness of breath and wheezing.   Cardiovascular:  Negative for chest pain and palpitations.  Gastrointestinal:  Negative for abdominal pain, constipation, diarrhea, nausea and vomiting.  Genitourinary:  Negative for dysuria and hematuria.  Musculoskeletal:   Positive for myalgias (Left lung pain- hurts to breathe in and out). Negative for arthralgias, back pain and joint swelling.  Skin:  Negative for rash.  Neurological:  Negative for dizziness, weakness and headaches.  Psychiatric/Behavioral:  Negative for dysphoric mood. The patient is not nervous/anxious.        Objective:        10/20/2023    4:08 PM 10/02/2023    3:40 PM 09/25/2023    2:07 PM  Vitals with BMI  Height 5' 1.5 5' 1.5 5' 1.5  Weight 152 lbs 3 oz 148 lbs 150 lbs  BMI 28.3 27.51 27.89  Systolic 112 116 871  Diastolic 72 82 86  Pulse 77 96 85    No data found.    Physical Exam Constitutional:      General: She is not in acute distress.    Appearance: Normal appearance. She is ill-appearing.  Cardiovascular:     Rate and Rhythm: Normal rate and regular rhythm.     Heart sounds: Normal heart sounds. No murmur heard. Pulmonary:     Effort: Pulmonary effort is normal.     Breath sounds: Examination of the left-upper field reveals decreased breath sounds. Examination of the left-middle field reveals decreased breath sounds. Examination of the right-lower field reveals decreased breath sounds and rales. Examination of the left-lower field reveals decreased breath sounds and rales. Decreased breath sounds and rales present.     Comments: Duoneb administered in the office, sounded better after treatment.  Chest:     Chest wall: Tenderness (left) present.  Abdominal:     Tenderness: There is no abdominal tenderness.  Skin:    General: Skin is warm.  Neurological:     Mental Status: She is alert. Mental status is at baseline.  Psychiatric:        Mood and Affect: Mood normal.        Behavior: Behavior normal.     Health Maintenance Due  Topic Date Due   Zoster Vaccines- Shingrix (1 of 2) Never done   Medicare Annual Wellness (AWV)  10/11/2023    There are no preventive care reminders to display for this patient.   Lab Results  Component Value Date    TSH 0.915 09/25/2023   Lab Results  Component Value Date   WBC 9.6 09/25/2023   HGB 12.7 09/25/2023   HCT 37.5 09/25/2023   MCV 87 09/25/2023   PLT 314 09/25/2023   Lab Results  Component Value Date   NA 139 09/25/2023   K 4.6 09/25/2023   CO2 16 (L) 09/25/2023   GLUCOSE 131 (H) 09/25/2023   BUN 32 (H) 09/25/2023   CREATININE 1.76 (H) 09/25/2023   BILITOT 0.3 09/25/2023   ALKPHOS 78 09/25/2023   AST 12 09/25/2023   ALT 7 09/25/2023   PROT 6.5 09/25/2023   ALBUMIN  4.3 09/25/2023   CALCIUM  9.9 09/25/2023   ANIONGAP 9 07/03/2022  EGFR 31 (L) 09/25/2023   Lab Results  Component Value Date   CHOL 231 (H) 09/25/2023   Lab Results  Component Value Date   HDL 87 09/25/2023   Lab Results  Component Value Date   LDLCALC 124 (H) 09/25/2023   Lab Results  Component Value Date   TRIG 117 09/25/2023   Lab Results  Component Value Date   CHOLHDL 2.7 09/25/2023   Lab Results  Component Value Date   HGBA1C 5.5 06/23/2023       Assessment & Plan:  Community acquired pneumonia of left lower lobe of lung Assessment & Plan: Persistent symptoms despite two courses of antibiotics (Amoxicillin  and Augmentin ). Suspected resistance to penicillin or different causative bacteria. -Administer Rocephin  shot today. -Prescribe Azithromycin  (Z-Pak). -If no improvement, consider CT scan of chest to rule out other causes.  Orders: -     cefTRIAXone  Sodium  COPD exacerbation (HCC) Assessment & Plan: -Administer DuoNeb nebulizer treatment in office today. -Prescribe DuoNeb for home use with nebulizer every 6 hours as needed for shortness of breath. -Continue using Breo inhaler. -Consider use of home oxygen  if shortness of breath persists. Inflammation Likely secondary to pneumonia and COPD exacerbation. -Prescribe Prednisone  40mg  daily for 5 days to reduce inflammation. Educated on when to go to the ED  Orders: -     predniSONE ; Take 2 tablets (40 mg total) by mouth daily  with breakfast for 5 days.  Dispense: 10 tablet; Refill: 0 -     For home use only DME Nebulizer machine -     Ipratropium-Albuterol   Urge incontinence of urine Assessment & Plan: Needs incontinence supplies. Diapers.   Moderate protein-calorie malnutrition (HCC) Assessment & Plan: Continue ensure or boost twice daily.       Meds ordered this encounter  Medications   DISCONTD: azithromycin  (ZITHROMAX ) 250 MG tablet    Sig: 2 DAILY FOR FIRST DAY, THEN DECREASE TO ONE DAILY FOR 4 MORE DAYS.    Dispense:  6 tablet    Refill:  0   predniSONE  (DELTASONE ) 20 MG tablet    Sig: Take 2 tablets (40 mg total) by mouth daily with breakfast for 5 days.    Dispense:  10 tablet    Refill:  0   DISCONTD: ipratropium-albuterol  (DUONEB) 0.5-2.5 (3) MG/3ML SOLN    Sig: Take 3 mLs by nebulization every 6 (six) hours as needed (SOB).    Dispense:  90 mL    Refill:  0   ipratropium-albuterol  (DUONEB) 0.5-2.5 (3) MG/3ML nebulizer solution 3 mL   cefTRIAXone  (ROCEPHIN ) injection 1 g    Orders Placed This Encounter  Procedures   For home use only DME Nebulizer machine     Follow-up: Return if symptoms worsen or fail to improve.  An After Visit Summary was printed and given to the patient.  Total time spent on today's visit was 38 minutes, including both face-to-face time and nonface-to-face time personally spent on review of chart (labs and imaging), discussing labs and goals, discussing further work-up, treatment options, referrals to specialist if needed, reviewing outside records if pertinent, answering patient's questions, and coordinating care.   Harrie Cedar, FNP [facility] [phone]

## 2023-11-03 NOTE — Assessment & Plan Note (Signed)
 Continue ensure or boost twice daily.

## 2023-11-04 ENCOUNTER — Encounter: Payer: Self-pay | Admitting: Family Medicine

## 2023-11-04 ENCOUNTER — Telehealth: Payer: Self-pay | Admitting: Cardiology

## 2023-11-04 NOTE — Telephone Encounter (Signed)
-----   Message from Nurse Josephine Igo sent at 11/04/2023 12:42 PM EDT ----- Regarding: Needs appt Good afternoon,  Would you please contact patient to schedule an office visit?  Dr Elberta Fortis recommended she be seen due to ongoing fluid symptoms.  Her last visit with Dr Bing Matter was 05/2023.   Thanks so much, Randon Goldsmith, RN Device clinic

## 2023-11-04 NOTE — Telephone Encounter (Signed)
 LVM for pt to call and schedule an appt with Dr Bing Matter per [facility] for fluid problem/kbl 11/04/23

## 2023-11-04 NOTE — Telephone Encounter (Signed)
 Pt called back. She stated that she has not yet made an appt with Urology but she will make that appointment today. Please advise.

## 2023-11-04 NOTE — Progress Notes (Signed)
 EPIC Encounter for ICM Monitoring  Patient Name: [name] is a 68 y.o. female Date: 11/04/2023 Primary Care Physican: [name], FNP Primary Cardiologist: Bing Matter Electrophysiologist: Elberta Fortis 10/27/2023 Weight: 152 lbs (baseline 150 lbs) 11/04/2023 Weight: 150 lbs   Time in AT/AF <0.1 hr/day (<0.1%)                                                   Spoke with patient and heart failure questions reviewed.  Transmission results reviewed.  Pt's weight returned to baseline of 150 lbs and leg swelling greatly improved, but not resolved, after taking 1 week of Lasix 40 mg bid.  She still has trouble taking in a deep breath.    Diet: Frequently eats restaurants foods.  Unsure of amount of fluid intake but drinks water most of the day.  Education provided regarding salt content of restaurant foods and try to limit fluid intake to 64 oz daily    Optivol thoracic impedance suggesting fluid levels returned to normal after taking Furosemide 40 mg twice a day x 1 week with Potassium    Prescribed:  Furosemide 40 mg take 1 tablet(s) (40 mg total) by mouth every morning.   Labs: 09/25/2023 Creatinine 1.76, BUN 32, Potassium 4.6, Sodium 139, GFR 31  A complete set of results can be found in Results Review.   Recommendations:  No changes and encouraged to call if experiencing any fluid symptoms.   Pt will discuss breathing with PCP at 3/12 visit.   Message sent to [facility] office 3/11 to contact patient for OV as recommended by Dr Elberta Fortis for ongoing fluid symptoms (leg edema) and last visit was 06/25/2023.   Follow-up plan: ICM clinic phone appointment on 11/18/2023 to recheck fluid levels.   91 day device clinic remote transmission 12/03/2023.     EP/Cardiology Office Visits: Recall 10/14/2024 with Dr. Elberta Fortis.    Last visit with Dr Bing Matter was 06/25/2023 and recommended to follow up in a month.    Copy of ICM check sent to Dr. Elberta Fortis.   3 month ICM trend: 11/04/2023.    12-14 Month ICM  trend:     Karie Soda, RN 11/04/2023 12:25 PM

## 2023-11-05 ENCOUNTER — Ambulatory Visit (INDEPENDENT_AMBULATORY_CARE_PROVIDER_SITE_OTHER): Admitting: Family Medicine

## 2023-11-05 ENCOUNTER — Encounter: Payer: Self-pay | Admitting: Family Medicine

## 2023-11-05 ENCOUNTER — Ambulatory Visit: Admitting: Family Medicine

## 2023-11-05 ENCOUNTER — Ambulatory Visit: Payer: 59 | Admitting: Family Medicine

## 2023-11-05 VITALS — BP 110/70 | HR 99 | Temp 98.0°F | Resp 16 | Ht 61.5 in | Wt 147.4 lb

## 2023-11-05 DIAGNOSIS — M4726 Other spondylosis with radiculopathy, lumbar region: Secondary | ICD-10-CM | POA: Diagnosis not present

## 2023-11-05 DIAGNOSIS — N289 Disorder of kidney and ureter, unspecified: Secondary | ICD-10-CM

## 2023-11-05 DIAGNOSIS — J441 Chronic obstructive pulmonary disease with (acute) exacerbation: Secondary | ICD-10-CM

## 2023-11-05 HISTORY — DX: Disorder of kidney and ureter, unspecified: N28.9

## 2023-11-05 MED ORDER — TRIAMCINOLONE ACETONIDE 40 MG/ML IJ SUSP
80.0000 mg | Freq: Once | INTRAMUSCULAR | Status: AC
  Administered 2023-11-05: 80 mg via INTRAMUSCULAR

## 2023-11-05 MED ORDER — IPRATROPIUM-ALBUTEROL 0.5-2.5 (3) MG/3ML IN SOLN
3.0000 mL | Freq: Once | RESPIRATORY_TRACT | Status: AC
  Administered 2023-11-05: 3 mL via RESPIRATORY_TRACT

## 2023-11-05 MED ORDER — PREDNISONE 20 MG PO TABS: ORAL_TABLET | ORAL | 0 refills | Status: DC

## 2023-11-05 NOTE — Assessment & Plan Note (Signed)
 Degenerative spondylosis causing back pain and dyspnea. Under pain specialist care. - Consider referral to orthopedic specialist for further evaluation.

## 2023-11-05 NOTE — Assessment & Plan Note (Signed)
 Patient requests to be referred to an oncologist that her daughter in law recommended [facility] K. Arbutus Ped, MD) - Per Dr. Sedalia Muta she needs to see a Urologist first. - additional referral sent today for first available Urologist for renal lesion/mass

## 2023-11-05 NOTE — Assessment & Plan Note (Signed)
 Experiencing exacerbation with dyspnea and pleuritic chest pain. Discussed steroid benefits and side effects for inflammation control. - Order new nebulizer machine and supplies as this would be beneficial for her current condition, her last nebulizer stopped working and she has been without for several weeks. - Administer DuoNeb treatment in office. - patient sounded better after treatment - Consider steroid injection for inflammation. - order prednisone taper  - order STAT chest xray to R/O PE or CAP

## 2023-11-05 NOTE — Progress Notes (Unsigned)
 Subjective:  Patient ID: [name], female    DOB: September 03, 1955  Age: 68 y.o. MRN: [id number]  Chief Complaint  Patient presents with   Referral    Shortness of breathe    Discussed the use of AI scribe software for clinical note transcription with the patient, who gave verbal consent to proceed.  HPI Patient presents today for a referral for a urologist. The patient, with COPD, also complains of cough, shortness of breathe and wheezing, mainly at night when lying down and back pain.  She experiences ongoing difficulty breathing, which she is trying to determine if it is related to her lungs or back. Pain occurs when attempting to take a deep breath, and this has been a persistent issue. She has COPD and uses Breo Ellipta once daily. Her nebulizer machine for DuoNeb treatments is not working, and she requires a replacement. She has not used any steroids recently, although she had a course in February for a COPD exacerbation. She uses oxygen at home at a rate of two liters.  She reports back pain radiating from her mid to low back down to her hip. The pain starts in the back and moves downwards. 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 She wears a brace to alleviate the pain, which helps if she does not move.  No recent illness, fever, or hemoptysis. She confirms experiencing chest pain when taking a deep breath, difficulty sleeping due to poor breathing, inability to perform activities, and feeling short of breath even at rest.     09/25/2023    2:39 PM 06/02/2023    4:01 PM 04/07/2023    1:58 PM 12/31/2022    3:37 PM 10/10/2022    4:32 PM  Depression screen PHQ 2/9  Decreased Interest 1 2 0 1 3  Down, Depressed, Hopeless 1 2 1 3 3   PHQ - 2 Score 2 4 1 4 6   Altered sleeping 3 0 3 0 0  Tired, decreased energy 1 2 0 1 3  Change in appetite 2 3 3 1 3   Feeling bad or failure about yourself  1 1 2  0 0  Trouble concentrating 1 0 0 1 0   Moving slowly or fidgety/restless 1 0 1 0 0  Suicidal thoughts 0 0 0 0 0  PHQ-9 Score 11 10 10 7 12   Difficult doing work/chores Not difficult at all Somewhat difficult Very difficult Not difficult at all Not difficult at all        06/02/2023    4:00 PM  Fall Risk   Falls in the past year? 0  Number falls in past yr: 0  Injury with Fall? 0  Risk for fall due to : Impaired balance/gait  Follow up Falls evaluation completed;Follow up appointment    Patient Care Team: [name], FNP as PCP - General (Family Medicine) Regan Lemming, MD as PCP - Electrophysiology (Cardiology) Georgeanna Lea, MD as PCP - Cardiology (Cardiology) Tressie Stalker, MD as Consulting Physician (Neurosurgery) Georgeanna Lea, MD as Consulting Physician (Cardiology) Karie Soda, MD as Consulting Physician (General Surgery) Drema Dallas, DO as Consulting Physician (Neurology) Earvin Hansen, [facility] (Inactive) as Pharmacist (Pharmacist) Kerin Salen, MD as Consulting Physician (Gastroenterology) Zehr, Princella Pellegrini, PA-C as Physician Assistant (Gastroenterology) Anthony Sar, MD as Consulting Physician (Nephrology)   Review of Systems  Constitutional:  Negative for chills, diaphoresis, fatigue and fever.  HENT:  Negative for congestion, ear  pain and sinus pain.   Respiratory:  Positive for cough, shortness of breath and wheezing.   Cardiovascular:  Negative for chest pain.  Gastrointestinal:  Negative for abdominal pain, constipation, diarrhea, nausea and vomiting.  Genitourinary:  Negative for dysuria.  Musculoskeletal:  Negative for arthralgias.  Neurological:  Negative for weakness and headaches.  Psychiatric/Behavioral:  Negative for dysphoric mood. The patient is not nervous/anxious.     Current Outpatient Medications on File Prior to Visit  Medication Sig Dispense Refill   ARIPiprazole (ABILIFY) 5 MG tablet TAKE 1 TABLET BY MOUTH DAILY 30 tablet 10   aspirin 81 MG tablet Take 81  mg by mouth daily.     BREO ELLIPTA 100-25 MCG/ACT AEPB INHALE 1 PUFF INTO THE LUNGS DAILY. 60 each 10   Buprenorphine HCl (BELBUCA) 900 MCG FILM Place 900 mcg inside cheek every 12 (twelve) hours.     carvedilol (COREG) 3.125 MG tablet TAKE ONE (1) TABLET BY MOUTH TWICE DAILY WITH MEALS (Patient taking differently: Take 3.125 mg by mouth 2 (two) times daily with a meal.) 60 tablet 5   denosumab (PROLIA) 60 MG/ML SOSY injection Inject 60 mg into the skin every 6 (six) months. 180 mL 2   dextromethorphan 15 MG/5ML syrup Take 10 mLs (30 mg total) by mouth 4 (four) times daily as needed for cough. 120 mL 0   diclofenac Sodium (VOLTAREN) 1 % GEL Apply 2 g topically 4 (four) times daily. 50 g 4   EPINEPHRINE 0.3 mg/0.3 mL IJ SOAJ injection Inject 0.3 mLs (0.3 mg total) into the muscle as needed for anaphylaxis. 1 each 2   esomeprazole (NEXIUM) 20 MG capsule TAKE ONE (1) CAPSULE BY MOUTH ONCE DAILY 90 capsule 1   FARXIGA 5 MG TABS tablet Take 5 mg by mouth daily.     fluticasone (FLONASE) 50 MCG/ACT nasal spray USE 2 SPRAYS IN EACH NOSTRILS DAILY AS NEEDED (Patient taking differently: Place 2 sprays into both nostrils daily as needed for allergies or rhinitis.) 48 g 0   gabapentin (NEURONTIN) 800 MG tablet Take 1 tablet (800 mg total) by mouth 3 (three) times daily. Patient usually takes twice a day 270 tablet 2   Galcanezumab-gnlm (EMGALITY) 120 MG/ML SOSY Inject 120 mg into the skin every 30 (thirty) days. 3 mL 0   guaiFENesin-codeine 100-10 MG/5ML syrup Take 5 mLs by mouth 3 (three) times daily as needed for cough. 120 mL 0   ipratropium-albuterol (DUONEB) 0.5-2.5 (3) MG/3ML SOLN USE 1 VIAL VIA NEBULIZER EVERY 6 HOURS AS NEEDED FOR SHORTNESS OF BREATH 90 mL 0   levocetirizine (XYZAL) 5 MG tablet Take 5 mg by mouth daily as needed for allergies.     lubiprostone (AMITIZA) 24 MCG capsule TAKE 1 CAPSULE(24 MCG) BY MOUTH TWICE DAILY WITH A MEAL 180 capsule 1   megestrol (MEGACE) 20 MG tablet Take 1 tablet  (20 mg total) by mouth daily. 90 tablet 2   Multiple Vitamin (MULTIVITAMIN WITH MINERALS) TABS tablet Take 1 tablet by mouth daily.     naloxone (NARCAN) nasal spray 4 mg/0.1 mL Place 1 spray into the nose once.     nitroGLYCERIN (NITROSTAT) 0.4 MG SL tablet Place 1 tablet (0.4 mg total) under the tongue every 5 (five) minutes as needed for chest pain. 25 tablet 6   ondansetron (ZOFRAN) 4 MG tablet Take 1 tablet (4 mg total) by mouth every 8 (eight) hours as needed for nausea or vomiting. 20 tablet 0   oxyCODONE (ROXICODONE) 15 MG immediate  release tablet Take 15 mg by mouth 3 (three) times daily as needed for pain.     potassium chloride (KLOR-CON) 10 MEQ tablet Take 3 tablets (30 mEq total) by mouth daily for 7 days. 21 tablet 3   ranolazine (RANEXA) 500 MG 12 hr tablet TAKE 1 TABLET BY MOUTH TWICE DAILY 60 tablet 10   rosuvastatin (CRESTOR) 20 MG tablet TAKE 1 TABLET BY MOUTH ONCE DAILY 90 tablet 1   sacubitril-valsartan (ENTRESTO) 24-26 MG TAKE ONE (1) TABLET BY MOUTH TWICE DAILY (Patient taking differently: Take 1 tablet by mouth 2 (two) times daily.) 60 tablet 5   sertraline (ZOLOFT) 100 MG tablet TAKE 1/2 TABLET(50 MG) BY MOUTH DAILY 45 tablet 1   tiZANidine (ZANAFLEX) 4 MG tablet Take 4 mg by mouth at bedtime.     topiramate (TOPAMAX) 50 MG tablet TAKE TWO (2) TABLETS BY MOUTH EVERY DAY AT BEDTIME (Patient taking differently: Take 100 mg by mouth at bedtime. TAKE TWO (2) TABLETS BY MOUTH EVERY DAY AT BEDTIME) 60 tablet 5   Ubrogepant (UBRELVY) 100 MG TABS Take 1 tablet (100 mg total) by mouth as needed (May repeat after 2 hours.  Maximum 2 tablets in 24 hours). TAKE 1 TABLET BY MOUTH BY MOUTH AS NEEDED( MAY REPEAT 1 TABLET AFTER 2 HOURS IF NEEDED, MAXIMUM 2 TABLETS IN 24 HOURS) Strength: 100 mg 48 tablet 1   Vitamin D, Ergocalciferol, (DRISDOL) 1.25 MG (50000 UNIT) CAPS capsule TAKE 1 CAPSULE BY MOUTH EVERY 7 DAYS FOR 12 DOSES (Patient taking differently: Take 50,000 Units by mouth every 7  (seven) days.) 12 capsule 0   furosemide (LASIX) 40 MG tablet Take 1 tablet (40 mg total) by mouth daily. 90 tablet 3   No current facility-administered medications on file prior to visit.   Past Medical History:  Diagnosis Date   Abdominal pain 10/17/2020   Abnormality of gait due to impairment of balance 11/22/2020   Acute diverticulitis 10/17/2020   Admission for long-term opiate analgesic use 10/24/2019   Arthritis of right hip 05/29/2016   Formatting of this note might be different from the original. Added automatically from request for surgery [id number]   BMI 26.0-26.9,adult 03/29/2020   Bronchial asthma    Cardiomyopathy (HCC)    Overview:  Ejection fraction 45% in 2015 Ejection fraction 30 to 35% in November 2018   Chronic back pain    Chronic constipation 07/31/2021   Chronic hypoxemic respiratory failure (HCC) 10/24/2019   Chronic narcotic use 03/23/2015   Chronic pain of both knees 12/21/2018   Added automatically from request for surgery [id number]  Formatting of this note might be different from the original. 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   Hypotension 05/17/2020   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    Ileus following gastrointestinal surgery (HCC) 03/26/2015  Major depressive disorder, single episode, moderate (HCC) 10/24/2019   Malnutrition of moderate degree (HCC) 10/20/2020   Mesenteric ischemia (HCC) 10/16/2020   Migraine without aura with status migrainosus 10/24/2019   Mixed incontinence 10/20/2020   Myocardial infarction [facility])    Nausea & vomiting    Obstructive chronic bronchitis with exacerbation (HCC) 10/25/2019   Other spondylosis with radiculopathy, lumbar region 10/24/2019   Persistent vomiting 05/17/2020   Presence of left artificial hip joint 10/24/2019   PTSD (post-traumatic stress disorder)    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 03/23/2015 04/19/2014   Senile osteoporosis 10/24/2019   Sepsis (HCC) 10/17/2020   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 Small internal hemrrhoids. Otherwise normal colonoscopy   COLONOSCOPY WITH PROPOFOL N/A 09/27/2021   Procedure: COLONOSCOPY WITH PROPOFOL;  Surgeon: Napoleon Form, MD;  Location: [facility] ENDOSCOPY;  Service: Endoscopy;  Laterality: N/A;   CORONARY ANGIOPLASTY     ESOPHAGOGASTRODUODENOSCOPY  02/04/2014   Mild gastritis. Otherwise normal EGD   HAND SURGERY Bilateral    ICD GENERATOR CHANGEOUT N/A 05/21/2019   Procedure: ICD GENERATOR CHANGEOUT;  Surgeon: Regan Lemming, MD;  Location: [facility] INVASIVE CV LAB;  Service: Cardiovascular;  Laterality: N/A;   ICD IMPLANT     Medtronic   intestinal blockage 2011     LAPAROSCOPIC ASSISTED  VENTRAL HERNIA REPAIR N/A 03/23/2015   Procedure: LAPAROSCOPIC VENTRAL WALL HERNIA REPAIR;  Surgeon: Karie Soda, MD;  Location: [facility] ORS;  Service: General;  Laterality: N/A;  With MESH   LAPAROSCOPIC LYSIS OF ADHESIONS N/A 03/23/2015   Procedure: LAPAROSCOPIC LYSIS OF ADHESIONS;  Surgeon: Karie Soda, MD;  Location: [facility] ORS;  Service: General;  Laterality: N/A;   LSCS      x2   NASAL SEPTUM SURGERY     NECK SURGERY     fused   TONSILLECTOMY     TYMPANOSTOMY TUBE PLACEMENT Right 2024   ULNAR NERVE TRANSPOSITION  01/23/2012   Procedure: ULNAR NERVE DECOMPRESSION/TRANSPOSITION;  Surgeon: Cristi Loron, MD;  Location: [facility] NEURO ORS;  Service: Neurosurgery;  Laterality: Left;  LEFT ulnar nerve decompression    Family History  Problem Relation Age of Onset   Hypertension Mother    Heart attack Mother    Alcohol abuse Father    Hypertension Father    Heart attack Father    Heart attack Other    Cancer Other    Heart failure Other    Anesthesia problems Neg Hx    Hypotension Neg Hx    Malignant hyperthermia Neg Hx    Pseudochol deficiency Neg Hx    Breast cancer Neg Hx    Social History   Socioeconomic History   Marital status: Single    Spouse name: Not on file   Number of children: 3   Years of education: Not on file   Highest education level: 10th grade  Occupational History   Occupation: disabled  Tobacco Use   Smoking status:  Former    Current packs/day: 0.00    Average packs/day: 0.5 packs/day for 30.0 years (15.0 ttl pk-yrs)    Types: Cigarettes    Start date: 03/1990    Quit date: 03/2020    Years since quitting: 3.6   Smokeless tobacco: Never  Vaping Use   Vaping status: Never Used  Substance and Sexual Activity   Alcohol use: Yes    Alcohol/week: 2.0 standard drinks of alcohol    Types: 1 Cans of beer, 1 Standard drinks or equivalent per week    Comment: seldom   Drug use: No   Sexual activity: Not Currently  Other Topics Concern   Not on file  Social  History Narrative   One level home with boyfriend   Caffeine - coffee 1-2 cups/day; Green tea 4-5 bottles a day   Exercise - some    Right handed      Social Drivers of Health   Financial Resource Strain: Low Risk  (04/05/2023)   Overall Financial Resource Strain (CARDIA)    Difficulty of Paying Living Expenses: Not very hard  Food Insecurity: No Food Insecurity (04/05/2023)   Hunger Vital Sign    Worried About Running Out of Food in the Last Year: Never true    Ran Out of Food in the Last Year: Never true  Transportation Needs: Unmet Transportation Needs (04/05/2023)   PRAPARE - Administrator, Civil Service (Medical): Yes    Lack of Transportation (Non-Medical): No  Physical Activity: Insufficiently Active (04/05/2023)   Exercise Vital Sign    Days of Exercise per Week: 3 days    Minutes of Exercise per Session: 20 min  Stress: Stress Concern Present (04/05/2023)   [employer] of Occupational Health - Occupational Stress Questionnaire    Feeling of Stress : Very much  Social Connections: Moderately Isolated (04/05/2023)   Social Connection and Isolation Panel [NHANES]    Frequency of Communication with Friends and Family: More than three times a week    Frequency of Social Gatherings with Friends and Family: More than three times a week    Attends Religious Services: 1 to 4 times per year    Active Member of [employer] or Organizations: No    Attends Engineer, structural: Never    Marital Status: Divorced    Objective:  BP 110/70   Pulse 99   Temp 98 F (36.7 C) (Oral)   Resp 16   Ht 5' 1.5" (1.562 m)   Wt 147 lb 6.4 oz (66.9 kg)   SpO2 100%   BMI 27.40 kg/m      11/05/2023    3:45 PM 10/20/2023    4:08 PM 10/02/2023    3:40 PM  BP/Weight  Systolic BP 110 112 116  Diastolic BP 70 72 82  Wt. (Lbs) 147.4 152.2 148  BMI 27.4 kg/m2 28.29 kg/m2 27.51 kg/m2    Physical Exam Vitals reviewed.  Constitutional:      General: She is not in acute  distress.    Appearance: Normal appearance. She is not ill-appearing.  Eyes:     Conjunctiva/sclera: Conjunctivae normal.  Neck:     Vascular: No carotid bruit.  Cardiovascular:     Rate and Rhythm: Normal rate and regular rhythm.     Heart sounds: Normal heart sounds. No murmur heard. Pulmonary:     Effort: Pulmonary effort is normal.     Breath sounds: Decreased air movement present. Examination of the right-lower field reveals decreased  breath sounds. Examination of the left-lower field reveals decreased breath sounds. Decreased breath sounds present. No wheezing.  Chest:     Chest wall: Tenderness present.  Abdominal:     General: Bowel sounds are normal.     Palpations: Abdomen is soft.     Tenderness: There is no abdominal tenderness.  Neurological:     Mental Status: She is alert. Mental status is at baseline.  Psychiatric:        Mood and Affect: Mood normal.        Behavior: Behavior normal.    Lab Results  Component Value Date   WBC 9.6 09/25/2023   HGB 12.7 09/25/2023   HCT 37.5 09/25/2023   PLT 314 09/25/2023   GLUCOSE 131 (H) 09/25/2023   CHOL 231 (H) 09/25/2023   TRIG 117 09/25/2023   HDL 87 09/25/2023   LDLCALC 124 (H) 09/25/2023   ALT 7 09/25/2023   AST 12 09/25/2023   NA 139 09/25/2023   K 4.6 09/25/2023   CL 108 (H) 09/25/2023   CREATININE 1.76 (H) 09/25/2023   BUN 32 (H) 09/25/2023   CO2 16 (L) 09/25/2023   TSH 0.915 09/25/2023   INR 1.0 10/18/2020   HGBA1C 5.5 06/23/2023      Assessment & Plan:   COPD exacerbation (HCC) Assessment & Plan: Experiencing exacerbation with dyspnea and pleuritic chest pain. Discussed steroid benefits and side effects for inflammation control. - Order new nebulizer machine and supplies as this would be beneficial for her current condition, her last nebulizer stopped working and she has been without for several weeks. - Administer DuoNeb treatment in office. - patient sounded better after treatment - Consider  steroid injection for inflammation. - order prednisone taper  - order STAT chest xray to R/O PE or CAP   Orders: -     For home use only DME Nebulizer machine -     Ipratropium-Albuterol -     Triamcinolone Acetonide -     predniSONE; Take 3 tablets (60 mg total) by mouth daily with breakfast for 3 days, THEN 2 tablets (40 mg total) daily with breakfast for 3 days, THEN 1 tablet (20 mg total) daily with breakfast for 3 days.  Dispense: 18 tablet; Refill: 0 -     DG Chest 2 View; Future  Renal lesion Assessment & Plan: Patient requests to be referred to an oncologist that her daughter in law recommended Arbutus Ped K. Arbutus Ped, MD) - Per Dr. Sedalia Muta she needs to see a Urologist first. - additional referral sent today for first available Urologist for renal lesion/mass  Orders: -     Ambulatory referral to Urology  Other spondylosis with radiculopathy, lumbar region Assessment & Plan: Degenerative spondylosis causing back pain and dyspnea. Under pain specialist care. - Consider referral to orthopedic specialist for further evaluation.      Meds ordered this encounter  Medications   ipratropium-albuterol (DUONEB) 0.5-2.5 (3) MG/3ML nebulizer solution 3 mL   triamcinolone acetonide (KENALOG-40) injection 80 mg   predniSONE (DELTASONE) 20 MG tablet    Sig: Take 3 tablets (60 mg total) by mouth daily with breakfast for 3 days, THEN 2 tablets (40 mg total) daily with breakfast for 3 days, THEN 1 tablet (20 mg total) daily with breakfast for 3 days.    Dispense:  18 tablet    Refill:  0    Orders Placed This Encounter  Procedures   For home use only DME Nebulizer machine   DG Chest 2 View  Ambulatory referral to Urology     Follow-up: Return if symptoms worsen or fail to improve.  Bobbye Charleston, FNP, have reviewed all documentation for this visit. The documentation on 12/Mar/2025   for the exam, diagnosis, procedures, and orders are all accurate and complete.     An After Visit  Summary was printed and given to the patient.  Lajuana Matte, FNP [facility] [phone]

## 2023-11-05 NOTE — Telephone Encounter (Signed)
 We spoke with urology (dr. Saddie Benders) and asked that they call her with an appointment. Dr. Sedalia Muta

## 2023-11-06 ENCOUNTER — Ambulatory Visit (HOSPITAL_BASED_OUTPATIENT_CLINIC_OR_DEPARTMENT_OTHER)
Admission: RE | Admit: 2023-11-06 | Discharge: 2023-11-06 | Disposition: A | Discharge status: Home / Self Care | Source: Ambulatory Visit | Attending: Family Medicine | Admitting: Family Medicine

## 2023-11-06 DIAGNOSIS — R079 Chest pain, unspecified: Secondary | ICD-10-CM | POA: Diagnosis not present

## 2023-11-06 DIAGNOSIS — N3289 Other specified disorders of bladder: Secondary | ICD-10-CM | POA: Diagnosis not present

## 2023-11-06 DIAGNOSIS — J441 Chronic obstructive pulmonary disease with (acute) exacerbation: Secondary | ICD-10-CM | POA: Diagnosis not present

## 2023-11-06 DIAGNOSIS — N1330 Unspecified hydronephrosis: Secondary | ICD-10-CM | POA: Diagnosis not present

## 2023-11-06 DIAGNOSIS — J449 Chronic obstructive pulmonary disease, unspecified: Secondary | ICD-10-CM | POA: Diagnosis not present

## 2023-11-06 DIAGNOSIS — N390 Urinary tract infection, site not specified: Secondary | ICD-10-CM | POA: Diagnosis not present

## 2023-11-09 DIAGNOSIS — Z743 Need for continuous supervision: Secondary | ICD-10-CM | POA: Diagnosis not present

## 2023-11-09 DIAGNOSIS — M549 Dorsalgia, unspecified: Secondary | ICD-10-CM | POA: Diagnosis not present

## 2023-11-09 DIAGNOSIS — N390 Urinary tract infection, site not specified: Secondary | ICD-10-CM | POA: Diagnosis not present

## 2023-11-09 DIAGNOSIS — G8929 Other chronic pain: Secondary | ICD-10-CM | POA: Diagnosis not present

## 2023-11-09 DIAGNOSIS — M545 Low back pain, unspecified: Secondary | ICD-10-CM | POA: Diagnosis not present

## 2023-11-09 DIAGNOSIS — N3001 Acute cystitis with hematuria: Secondary | ICD-10-CM | POA: Diagnosis not present

## 2023-11-10 DIAGNOSIS — J449 Chronic obstructive pulmonary disease, unspecified: Secondary | ICD-10-CM | POA: Diagnosis not present

## 2023-11-10 DIAGNOSIS — N390 Urinary tract infection, site not specified: Secondary | ICD-10-CM | POA: Diagnosis not present

## 2023-11-10 DIAGNOSIS — M545 Low back pain, unspecified: Secondary | ICD-10-CM | POA: Diagnosis not present

## 2023-11-11 ENCOUNTER — Ambulatory Visit: Admitting: Cardiology

## 2023-11-11 ENCOUNTER — Telehealth: Payer: Self-pay

## 2023-11-11 NOTE — Transitions of Care (Post Inpatient/ED Visit) (Signed)
   11/11/2023  Name: [name] MRN: [id number] DOB: 11/16/1955  Today's TOC FU Call Status: Today's TOC FU Call Status:: Unsuccessful Call (1st Attempt) Unsuccessful Call (1st Attempt) Date: 11/11/23  Attempted to reach the patient regarding the most recent Inpatient/ED visit.  Follow Up Plan: Additional outreach attempts will be made to reach the patient to complete the Transitions of Care (Post Inpatient/ED visit) call.   Signature Karena Addison, LPN [facility] Nurse Health Advisor Direct Dial [phone]

## 2023-11-14 ENCOUNTER — Ambulatory Visit: Admitting: Cardiology

## 2023-11-14 ENCOUNTER — Emergency Department (HOSPITAL_COMMUNITY)

## 2023-11-14 ENCOUNTER — Inpatient Hospital Stay (HOSPITAL_COMMUNITY)
Admission: EM | Admit: 2023-11-14 | Discharge: 2023-11-24 | DRG: 988 | Disposition: A | Discharge status: Skilled Nursing Facility | Attending: Internal Medicine | Admitting: Internal Medicine

## 2023-11-14 ENCOUNTER — Other Ambulatory Visit: Payer: Self-pay

## 2023-11-14 ENCOUNTER — Inpatient Hospital Stay (HOSPITAL_COMMUNITY)

## 2023-11-14 ENCOUNTER — Encounter (HOSPITAL_COMMUNITY): Payer: Self-pay

## 2023-11-14 DIAGNOSIS — N898 Other specified noninflammatory disorders of vagina: Secondary | ICD-10-CM | POA: Diagnosis not present

## 2023-11-14 DIAGNOSIS — T426X1A Poisoning by other antiepileptic and sedative-hypnotic drugs, accidental (unintentional), initial encounter: Secondary | ICD-10-CM | POA: Diagnosis not present

## 2023-11-14 DIAGNOSIS — M8000XA Age-related osteoporosis with current pathological fracture, unspecified site, initial encounter for fracture: Secondary | ICD-10-CM | POA: Diagnosis not present

## 2023-11-14 DIAGNOSIS — E785 Hyperlipidemia, unspecified: Secondary | ICD-10-CM | POA: Diagnosis present

## 2023-11-14 DIAGNOSIS — G8220 Paraplegia, unspecified: Principal | ICD-10-CM

## 2023-11-14 DIAGNOSIS — K5900 Constipation, unspecified: Secondary | ICD-10-CM | POA: Diagnosis present

## 2023-11-14 DIAGNOSIS — D631 Anemia in chronic kidney disease: Secondary | ICD-10-CM | POA: Diagnosis not present

## 2023-11-14 DIAGNOSIS — G893 Neoplasm related pain (acute) (chronic): Secondary | ICD-10-CM | POA: Diagnosis present

## 2023-11-14 DIAGNOSIS — M5126 Other intervertebral disc displacement, lumbar region: Secondary | ICD-10-CM | POA: Diagnosis not present

## 2023-11-14 DIAGNOSIS — D72829 Elevated white blood cell count, unspecified: Secondary | ICD-10-CM | POA: Diagnosis present

## 2023-11-14 DIAGNOSIS — C679 Malignant neoplasm of bladder, unspecified: Secondary | ICD-10-CM | POA: Diagnosis not present

## 2023-11-14 DIAGNOSIS — Z8616 Personal history of COVID-19: Secondary | ICD-10-CM | POA: Diagnosis not present

## 2023-11-14 DIAGNOSIS — I5042 Chronic combined systolic (congestive) and diastolic (congestive) heart failure: Secondary | ICD-10-CM | POA: Diagnosis present

## 2023-11-14 DIAGNOSIS — N329 Bladder disorder, unspecified: Secondary | ICD-10-CM | POA: Diagnosis not present

## 2023-11-14 DIAGNOSIS — R29890 Loss of height: Secondary | ICD-10-CM | POA: Diagnosis not present

## 2023-11-14 DIAGNOSIS — M545 Low back pain, unspecified: Secondary | ICD-10-CM | POA: Diagnosis not present

## 2023-11-14 DIAGNOSIS — E440 Moderate protein-calorie malnutrition: Secondary | ICD-10-CM | POA: Diagnosis not present

## 2023-11-14 DIAGNOSIS — Z8249 Family history of ischemic heart disease and other diseases of the circulatory system: Secondary | ICD-10-CM

## 2023-11-14 DIAGNOSIS — I5030 Unspecified diastolic (congestive) heart failure: Secondary | ICD-10-CM | POA: Diagnosis not present

## 2023-11-14 DIAGNOSIS — S22050D Wedge compression fracture of T5-T6 vertebra, subsequent encounter for fracture with routine healing: Secondary | ICD-10-CM | POA: Diagnosis not present

## 2023-11-14 DIAGNOSIS — Z95810 Presence of automatic (implantable) cardiac defibrillator: Secondary | ICD-10-CM

## 2023-11-14 DIAGNOSIS — S3210XA Unspecified fracture of sacrum, initial encounter for closed fracture: Secondary | ICD-10-CM | POA: Diagnosis not present

## 2023-11-14 DIAGNOSIS — D638 Anemia in other chronic diseases classified elsewhere: Secondary | ICD-10-CM | POA: Diagnosis present

## 2023-11-14 DIAGNOSIS — M4856XA Collapsed vertebra, not elsewhere classified, lumbar region, initial encounter for fracture: Secondary | ICD-10-CM | POA: Diagnosis present

## 2023-11-14 DIAGNOSIS — N1330 Unspecified hydronephrosis: Secondary | ICD-10-CM | POA: Diagnosis not present

## 2023-11-14 DIAGNOSIS — M51369 Other intervertebral disc degeneration, lumbar region without mention of lumbar back pain or lower extremity pain: Secondary | ICD-10-CM | POA: Diagnosis not present

## 2023-11-14 DIAGNOSIS — N1339 Other hydronephrosis: Secondary | ICD-10-CM | POA: Diagnosis not present

## 2023-11-14 DIAGNOSIS — R278 Other lack of coordination: Secondary | ICD-10-CM | POA: Diagnosis not present

## 2023-11-14 DIAGNOSIS — M4804 Spinal stenosis, thoracic region: Secondary | ICD-10-CM | POA: Diagnosis not present

## 2023-11-14 DIAGNOSIS — R892 Abnormal level of other drugs, medicaments and biological substances in specimens from other organs, systems and tissues: Secondary | ICD-10-CM | POA: Diagnosis not present

## 2023-11-14 DIAGNOSIS — C678 Malignant neoplasm of overlapping sites of bladder: Secondary | ICD-10-CM | POA: Diagnosis not present

## 2023-11-14 DIAGNOSIS — M810 Age-related osteoporosis without current pathological fracture: Secondary | ICD-10-CM | POA: Diagnosis present

## 2023-11-14 DIAGNOSIS — R937 Abnormal findings on diagnostic imaging of other parts of musculoskeletal system: Secondary | ICD-10-CM | POA: Diagnosis not present

## 2023-11-14 DIAGNOSIS — Z79899 Other long term (current) drug therapy: Secondary | ICD-10-CM

## 2023-11-14 DIAGNOSIS — Z6833 Body mass index (BMI) 33.0-33.9, adult: Secondary | ICD-10-CM

## 2023-11-14 DIAGNOSIS — G894 Chronic pain syndrome: Secondary | ICD-10-CM | POA: Diagnosis present

## 2023-11-14 DIAGNOSIS — N131 Hydronephrosis with ureteral stricture, not elsewhere classified: Secondary | ICD-10-CM | POA: Diagnosis present

## 2023-11-14 DIAGNOSIS — E66811 Obesity, class 1: Secondary | ICD-10-CM | POA: Diagnosis present

## 2023-11-14 DIAGNOSIS — M4854XA Collapsed vertebra, not elsewhere classified, thoracic region, initial encounter for fracture: Secondary | ICD-10-CM | POA: Diagnosis present

## 2023-11-14 DIAGNOSIS — R7989 Other specified abnormal findings of blood chemistry: Secondary | ICD-10-CM

## 2023-11-14 DIAGNOSIS — N1832 Chronic kidney disease, stage 3b: Secondary | ICD-10-CM | POA: Diagnosis present

## 2023-11-14 DIAGNOSIS — Z79891 Long term (current) use of opiate analgesic: Secondary | ICD-10-CM

## 2023-11-14 DIAGNOSIS — R32 Unspecified urinary incontinence: Secondary | ICD-10-CM | POA: Diagnosis present

## 2023-11-14 DIAGNOSIS — R531 Weakness: Secondary | ICD-10-CM | POA: Diagnosis present

## 2023-11-14 DIAGNOSIS — K5730 Diverticulosis of large intestine without perforation or abscess without bleeding: Secondary | ICD-10-CM | POA: Diagnosis not present

## 2023-11-14 DIAGNOSIS — I700 Atherosclerosis of aorta: Secondary | ICD-10-CM | POA: Diagnosis present

## 2023-11-14 DIAGNOSIS — R339 Retention of urine, unspecified: Principal | ICD-10-CM

## 2023-11-14 DIAGNOSIS — Z7982 Long term (current) use of aspirin: Secondary | ICD-10-CM

## 2023-11-14 DIAGNOSIS — I252 Old myocardial infarction: Secondary | ICD-10-CM

## 2023-11-14 DIAGNOSIS — C7951 Secondary malignant neoplasm of bone: Secondary | ICD-10-CM | POA: Diagnosis not present

## 2023-11-14 DIAGNOSIS — E611 Iron deficiency: Secondary | ICD-10-CM | POA: Diagnosis not present

## 2023-11-14 DIAGNOSIS — D494 Neoplasm of unspecified behavior of bladder: Secondary | ICD-10-CM | POA: Diagnosis not present

## 2023-11-14 DIAGNOSIS — I5022 Chronic systolic (congestive) heart failure: Secondary | ICD-10-CM | POA: Diagnosis not present

## 2023-11-14 DIAGNOSIS — I2510 Atherosclerotic heart disease of native coronary artery without angina pectoris: Secondary | ICD-10-CM | POA: Diagnosis present

## 2023-11-14 DIAGNOSIS — Z96649 Presence of unspecified artificial hip joint: Secondary | ICD-10-CM | POA: Diagnosis present

## 2023-11-14 DIAGNOSIS — Z9861 Coronary angioplasty status: Secondary | ICD-10-CM

## 2023-11-14 DIAGNOSIS — Z87891 Personal history of nicotine dependence: Secondary | ICD-10-CM | POA: Diagnosis not present

## 2023-11-14 DIAGNOSIS — Z5982 Transportation insecurity: Secondary | ICD-10-CM

## 2023-11-14 DIAGNOSIS — D520 Dietary folate deficiency anemia: Secondary | ICD-10-CM

## 2023-11-14 DIAGNOSIS — N899 Noninflammatory disorder of vagina, unspecified: Secondary | ICD-10-CM | POA: Diagnosis present

## 2023-11-14 DIAGNOSIS — G8221 Paraplegia, complete: Secondary | ICD-10-CM | POA: Diagnosis not present

## 2023-11-14 DIAGNOSIS — M899 Disorder of bone, unspecified: Secondary | ICD-10-CM

## 2023-11-14 DIAGNOSIS — N179 Acute kidney failure, unspecified: Secondary | ICD-10-CM | POA: Diagnosis not present

## 2023-11-14 DIAGNOSIS — M1611 Unilateral primary osteoarthritis, right hip: Secondary | ICD-10-CM | POA: Diagnosis present

## 2023-11-14 DIAGNOSIS — N3289 Other specified disorders of bladder: Secondary | ICD-10-CM | POA: Diagnosis not present

## 2023-11-14 DIAGNOSIS — E7849 Other hyperlipidemia: Secondary | ICD-10-CM | POA: Diagnosis not present

## 2023-11-14 DIAGNOSIS — Z7401 Bed confinement status: Secondary | ICD-10-CM | POA: Diagnosis not present

## 2023-11-14 DIAGNOSIS — S22060D Wedge compression fracture of T7-T8 vertebra, subsequent encounter for fracture with routine healing: Secondary | ICD-10-CM | POA: Diagnosis not present

## 2023-11-14 DIAGNOSIS — R109 Unspecified abdominal pain: Secondary | ICD-10-CM | POA: Diagnosis not present

## 2023-11-14 DIAGNOSIS — J449 Chronic obstructive pulmonary disease, unspecified: Secondary | ICD-10-CM | POA: Diagnosis not present

## 2023-11-14 DIAGNOSIS — Z9049 Acquired absence of other specified parts of digestive tract: Secondary | ICD-10-CM | POA: Diagnosis not present

## 2023-11-14 DIAGNOSIS — M48061 Spinal stenosis, lumbar region without neurogenic claudication: Secondary | ICD-10-CM | POA: Diagnosis present

## 2023-11-14 DIAGNOSIS — M47814 Spondylosis without myelopathy or radiculopathy, thoracic region: Secondary | ICD-10-CM | POA: Diagnosis present

## 2023-11-14 DIAGNOSIS — T380X5A Adverse effect of glucocorticoids and synthetic analogues, initial encounter: Secondary | ICD-10-CM | POA: Diagnosis present

## 2023-11-14 DIAGNOSIS — E8721 Acute metabolic acidosis: Secondary | ICD-10-CM | POA: Diagnosis present

## 2023-11-14 DIAGNOSIS — M546 Pain in thoracic spine: Secondary | ICD-10-CM | POA: Diagnosis not present

## 2023-11-14 DIAGNOSIS — D518 Other vitamin B12 deficiency anemias: Secondary | ICD-10-CM | POA: Diagnosis not present

## 2023-11-14 DIAGNOSIS — S32010D Wedge compression fracture of first lumbar vertebra, subsequent encounter for fracture with routine healing: Secondary | ICD-10-CM | POA: Diagnosis not present

## 2023-11-14 HISTORY — DX: Paraplegia, unspecified: G82.20

## 2023-11-14 HISTORY — DX: Acute kidney failure, unspecified: N17.9

## 2023-11-14 LAB — COMPREHENSIVE METABOLIC PANEL
ALT: 15 U/L (ref 0–44)
AST: 24 U/L (ref 15–41)
Albumin: 3.6 g/dL (ref 3.5–5.0)
Alkaline Phosphatase: 72 U/L (ref 38–126)
Anion gap: 15 (ref 5–15)
BUN: 83 mg/dL — ABNORMAL HIGH (ref 8–23)
CO2: 19 mmol/L — ABNORMAL LOW (ref 22–32)
Calcium: 9.5 mg/dL (ref 8.9–10.3)
Chloride: 101 mmol/L (ref 98–111)
Creatinine, Ser: 2.79 mg/dL — ABNORMAL HIGH (ref 0.44–1.00)
GFR, Estimated: 18 mL/min — ABNORMAL LOW (ref >60)
Glucose, Bld: 147 mg/dL — ABNORMAL HIGH (ref 70–99)
Potassium: 4.5 mmol/L (ref 3.5–5.1)
Sodium: 135 mmol/L (ref 135–145)
Total Bilirubin: 0.5 mg/dL (ref 0.0–1.2)
Total Protein: 7.6 g/dL (ref 6.5–8.1)

## 2023-11-14 LAB — URINALYSIS, ROUTINE W REFLEX MICROSCOPIC
Bilirubin Urine: NEGATIVE
Glucose, UA: NEGATIVE mg/dL
Hgb urine dipstick: NEGATIVE
Ketones, ur: 5 mg/dL — AB
Nitrite: NEGATIVE
Protein, ur: NEGATIVE mg/dL
Specific Gravity, Urine: 1.014 (ref 1.005–1.030)
pH: 5 (ref 5.0–8.0)

## 2023-11-14 LAB — CBC WITH DIFFERENTIAL/PLATELET
Abs Immature Granulocytes: 0.24 10*3/uL — ABNORMAL HIGH (ref 0.00–0.07)
Basophils Absolute: 0.1 10*3/uL (ref 0.0–0.1)
Basophils Relative: 0 %
Eosinophils Absolute: 0 10*3/uL (ref 0.0–0.5)
Eosinophils Relative: 0 %
HCT: 41.9 % (ref 36.0–46.0)
Hemoglobin: 13.2 g/dL (ref 12.0–15.0)
Immature Granulocytes: 2 %
Lymphocytes Relative: 9 %
Lymphs Abs: 1.1 10*3/uL (ref 0.7–4.0)
MCH: 28.9 pg (ref 26.0–34.0)
MCHC: 31.5 g/dL (ref 30.0–36.0)
MCV: 91.9 fL (ref 80.0–100.0)
Monocytes Absolute: 0.4 10*3/uL (ref 0.1–1.0)
Monocytes Relative: 3 %
Neutro Abs: 10.6 10*3/uL — ABNORMAL HIGH (ref 1.7–7.7)
Neutrophils Relative %: 86 %
Platelets: 361 10*3/uL (ref 150–400)
RBC: 4.56 MIL/uL (ref 3.87–5.11)
RDW: 14.3 % (ref 11.5–15.5)
WBC: 12.4 10*3/uL — ABNORMAL HIGH (ref 4.0–10.5)
nRBC: 0 % (ref 0.0–0.2)

## 2023-11-14 MED ORDER — POLYETHYLENE GLYCOL 3350 17 G PO PACK
17.0000 g | PACK | Freq: Every day | ORAL | Status: DC | PRN
  Administered 2023-11-15: 17 g via ORAL
  Filled 2023-11-14: qty 1

## 2023-11-14 MED ORDER — SODIUM CHLORIDE 0.9 % IV SOLN
1.0000 g | INTRAVENOUS | Status: AC
  Administered 2023-11-14 – 2023-11-16 (×3): 1 g via INTRAVENOUS
  Filled 2023-11-14 (×3): qty 10

## 2023-11-14 MED ORDER — ACETAMINOPHEN 325 MG PO TABS
650.0000 mg | ORAL_TABLET | Freq: Four times a day (QID) | ORAL | Status: DC | PRN
  Administered 2023-11-17 – 2023-11-20 (×6): 650 mg via ORAL
  Filled 2023-11-14 (×6): qty 2

## 2023-11-14 MED ORDER — SODIUM CHLORIDE 0.9% FLUSH
3.0000 mL | Freq: Two times a day (BID) | INTRAVENOUS | Status: DC
  Administered 2023-11-15 – 2023-11-24 (×15): 3 mL via INTRAVENOUS

## 2023-11-14 MED ORDER — FLUTICASONE FUROATE-VILANTEROL 100-25 MCG/ACT IN AEPB
1.0000 | INHALATION_SPRAY | Freq: Every day | RESPIRATORY_TRACT | Status: DC
  Administered 2023-11-15 – 2023-11-24 (×10): 1 via RESPIRATORY_TRACT
  Filled 2023-11-14 (×3): qty 28

## 2023-11-14 MED ORDER — OXYCODONE HCL 5 MG PO TABS
5.0000 mg | ORAL_TABLET | ORAL | Status: DC | PRN
  Administered 2023-11-14 – 2023-11-24 (×23): 5 mg via ORAL
  Filled 2023-11-14 (×24): qty 1

## 2023-11-14 MED ORDER — SENNOSIDES-DOCUSATE SODIUM 8.6-50 MG PO TABS
2.0000 | ORAL_TABLET | Freq: Two times a day (BID) | ORAL | Status: DC
  Administered 2023-11-14 – 2023-11-18 (×7): 2 via ORAL
  Filled 2023-11-14 (×16): qty 2

## 2023-11-14 MED ORDER — ACETAMINOPHEN 650 MG RE SUPP
650.0000 mg | Freq: Four times a day (QID) | RECTAL | Status: DC | PRN
Start: 2023-11-14 — End: 2023-11-24

## 2023-11-14 MED ORDER — IPRATROPIUM-ALBUTEROL 0.5-2.5 (3) MG/3ML IN SOLN: 3.0000 mL | RESPIRATORY_TRACT | Status: DC | PRN

## 2023-11-14 NOTE — Assessment & Plan Note (Signed)
 Vs. Parapersis.  Perineal exam was done by ER provider and per report, patient does not have any saddle anesthesia at the site.  However patient has associated urinary retention as well as report of not having bowel movement in the last 4 days as well as a worsening/severe lower back pain.  Maintain the patient on bedrest and proceed with a noncontrast CT of the thoracolumbar spine.  Plan thereafter is to transfer the patient to [facility] in case patient needs CT myelography or an MRI.

## 2023-11-14 NOTE — H&P (Signed)
 History and Physical    Patient: [name] OZH:[id number] DOB: May 02, 1956 DOA: 11/14/2023 DOS: the patient was seen and examined on 11/14/2023 PCP: [name], FNP  Patient coming from: Home  Chief Complaint:  Chief Complaint  Patient presents with   Flank Pain   HPI: [name] is a 68 y.o. female with medical history significant of COPD. Since at least 11/05/2023 patient has had  back pain radiating from her mid to low back down to her hip. The pain starts in the back and moves downwards. 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 She wears a brace to alleviate the pain, which helps if she does not move.  More recently, per report patient was diagnosed with bladder cancer and spinal stenosis however apparently this has not been biopsied yet. Per ER report : CT March 4 reviewed showing concern for bladder wall thickening and neoplasm as well as obstructing mass near the right UVJ and hydronephrosis, and concern for bony metastasis of the pubic rami or suspicious lesions. Patient also has an age-indeterminate T7 compression fracture.   Regardless patient was in her usual state of health till approximately 4 days ago when she reports a sudden worsening of her low midline back pain without any radiation worsened with certain positions or movement.  Associated with new weakness of bilateral lower extremity with inability to get back up and ambulate.  Associated with inability to urinate and no bowel movement since then.  Patient reports she is still passing gas and abdominal pain no vomiting.  Patient states that she has been managing at home as her family member have been helping her with activities of daily living.  Patient however had a worsening back pain over the last 24 hours prompting her to come to the ER.  Here in the ER.  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  MRI could not be done because of pacemaker placement. - I am advised by ER  attending that it is mri incompatible. Medical eval is sought. Case was discussed with Dr. Minette Brine of radiology to do CT myelogram. NES input deferred till we have some imaging.  Here in the ER patient found tobe in urinary retention s.p foley placement with 800 cc . Patient is having ongoign low back pain.   Review of Systems: As mentioned in the history of present illness. All other systems reviewed and are negative. Past Medical History:  Diagnosis Date   Abdominal pain 10/17/2020   Abnormality of gait due to impairment of balance 11/22/2020   Acute diverticulitis 10/17/2020   Admission for long-term opiate analgesic use 10/24/2019   Arthritis of right hip 05/29/2016   Formatting of this note might be different from the original. Added automatically from request for surgery [id number]   BMI 26.0-26.9,adult 03/29/2020   Bronchial asthma    Cardiomyopathy (HCC)    Overview:  Ejection fraction 45% in 2015 Ejection fraction 30 to 35% in November 2018   Chronic back pain    Chronic constipation 07/31/2021   Chronic hypoxemic respiratory failure (HCC) 10/24/2019   Chronic narcotic use 03/23/2015   Chronic pain of both knees 12/21/2018   Added automatically from request for surgery [id number]  Formatting of this note might be different from the original. 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/19/2017   Dyslipidemia 05/31/2015   GERD (gastroesophageal reflux disease)    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HCC) 07/13/2020   Hypotension 05/17/2020   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   Ileus following gastrointestinal surgery (HCC) 03/26/2015   Major depressive disorder, single episode, moderate (HCC) 10/24/2019   Malnutrition of moderate degree (HCC) 10/20/2020   Mesenteric ischemia (HCC) 10/16/2020   Migraine without aura with status migrainosus 10/24/2019   Mixed incontinence 10/20/2020   Myocardial infarction (HCC)    Nausea & vomiting    Obstructive chronic bronchitis with exacerbation (HCC) 10/25/2019   Other spondylosis with radiculopathy, lumbar region 10/24/2019   Persistent vomiting 05/17/2020   Presence of left artificial hip joint 10/24/2019   PTSD (post-traumatic stress disorder)    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 03/23/2015 04/19/2014   Senile osteoporosis 10/24/2019   Sepsis (HCC) 10/17/2020   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 Small internal hemrrhoids. Otherwise normal colonoscopy   COLONOSCOPY WITH PROPOFOL N/A 09/27/2021   Procedure: COLONOSCOPY WITH PROPOFOL;  Surgeon: Napoleon Form, MD;  Location: [facility];  Service: Endoscopy;  Laterality: N/A;   CORONARY ANGIOPLASTY     ESOPHAGOGASTRODUODENOSCOPY  02/04/2014   Mild gastritis. Otherwise normal EGD   HAND SURGERY Bilateral    ICD GENERATOR  CHANGEOUT N/A 05/21/2019   Procedure: ICD GENERATOR CHANGEOUT;  Surgeon: Regan Lemming, MD;  Location[facility] INVASIVE CV LAB;  Service: Cardiovascular;  Laterality: N/A;   ICD IMPLANT     Medtr[employer]   intestinal blockage 2011     LAPAROSCOPIC ASSISTED VENTRAL HERNIA REPAIR N/A 03/23/2015   Procedure: LAPAROSCOPIC VENTRAL WALL HERNIA REPAIR;  Surgeon: Karie Soda, MD;  Location: [facility];  Service: General;  Laterality: N/A;  With MESH   LAPAROSCOPIC LYSIS OF ADHESIONS N/A 03/23/2015   Procedure: LAPAROSCOPIC LYSIS OF ADHESIONS;  Surgeon: Karie Soda, MD;  Location: [facility];  Service: General;  Laterality: N/A;   LSCS      x2   NASAL SEPTUM SURGERY     NECK SURGERY     fused   TONSILLECTOMY     TYMPANOSTOMY TUBE PLACEMENT Right 2024   ULNAR NERVE TRANSPOSITION  01/23/2012   Procedure: ULNAR NERVE DECOMPRESSION/TRANSPOSITION;  Surgeon: Cristi Loron, MD;  Location: [facility];  Service: Neurosurgery;  Laterality: Left;  LEFT ulnar nerve decompression   Social History:  reports that she quit smoking about 3 years ago. Her smoking use included cigarettes. She started smoking about 33 years ago. She has a 15 pack-year smoking history. She has never  used smokeless tobacco. She reports current alcohol use of about 2.0 standard drinks of alcohol per week. She reports that she does not use drugs.  No Known Allergies  Family History  Problem Relation Age of Onset   Hypertension Mother    Heart attack Mother    Alcohol abuse Father    Hypertension Father    Heart attack Father    Heart attack Other    Cancer Other    Heart failure Other    Anesthesia problems Neg Hx    Hypotension Neg Hx    Malignant hyperthermia Neg Hx    Pseudochol deficiency Neg Hx    Breast cancer Neg Hx     Prior to Admission medications   Medication Sig Start Date End Date Taking? Authorizing Provider  ARIPiprazole (ABILIFY) 5 MG tablet TAKE 1 TABLET BY MOUTH DAILY 10/07/23   Cox, Fritzi Mandes, MD  aspirin 81  MG tablet Take 81 mg by mouth daily.    [provider]  BREO ELLIPTA 100-25 MCG/ACT AEPB INHALE 1 PUFF INTO THE LUNGS DAILY. 06/03/23   Langley Gauss, PA  Buprenorphine HCl (BELBUCA) 900 MCG FILM Place 900 mcg inside cheek every 12 (twelve) hours.    [provider]  carvedilol (COREG) 3.125 MG tablet TAKE ONE (1) TABLET BY MOUTH TWICE DAILY WITH MEALS Patient taking differently: Take 3.125 mg by mouth 2 (two) times daily with a meal. 06/02/23   Cox, Kirsten, MD  denosumab (PROLIA) 60 MG/ML SOSY injection Inject 60 mg into the skin every 6 (six) months. 02/14/20   Abigail Miyamoto, MD  dextromethorphan 15 MG/5ML syrup Take 10 mLs (30 mg total) by mouth 4 (four) times daily as needed for cough. 10/22/23   [name], FNP  diclofenac Sodium (VOLTAREN) 1 % GEL Apply 2 g topically 4 (four) times daily. 05/01/21   Abigail Miyamoto, MD  EPINEPHRINE 0.3 mg/0.3 mL IJ SOAJ injection Inject 0.3 mLs (0.3 mg total) into the muscle as needed for anaphylaxis. 04/27/20   Abigail Miyamoto, MD  esomeprazole (NEXIUM) 20 MG capsule TAKE ONE (1) CAPSULE BY MOUTH ONCE DAILY 08/28/23   Cox, Kirsten, MD  FARXIGA 5 MG TABS tablet Take 5 mg by mouth daily. 10/10/23   [provider]  fluticasone (FLONASE) 50 MCG/ACT nasal spray USE 2 SPRAYS IN EACH NOSTRILS DAILY AS NEEDED Patient taking differently: Place 2 sprays into both nostrils daily as needed for allergies or rhinitis. 07/18/23   [name], FNP  furosemide (LASIX) 40 MG tablet Take 1 tablet (40 mg total) by mouth daily. 06/25/23 09/23/23  Georgeanna Lea, MD  gabapentin (NEURONTIN) 800 MG tablet Take 1 tablet (800 mg total) by mouth 3 (three) times daily. Patient usually takes twice a day 01/21/20   Abigail Miyamoto, MD  Galcanezumab-gnlm [facility]) 120 MG/ML SOSY Inject 120 mg into the skin every 30 (thirty) days. 01/06/23   Cox, Fritzi Mandes, MD  guaiFENesin-codeine 100-10 MG/5ML syrup Take 5 mLs by mouth 3 (three) times  daily as needed for cough. 10/16/23   [name], FNP  ipratropium-albuterol (DUONEB) 0.5-2.5 (3) MG/3ML SOLN USE 1 VIAL VIA NEBULIZER EVERY 6 HOURS AS NEEDED FOR SHORTNESS OF BREATH 10/16/23   [name], FNP  levocetirizine (XYZAL) 5 MG tablet Take 5 mg by mouth daily as needed for allergies. 12/13/19   [provider]  lubiprostone (AMITIZA) 24 MCG capsule TAKE 1 CAPSULE(24 MCG) BY MOUTH TWICE DAILY WITH A MEAL 09/15/23   Cox,  Kirsten, MD  megestrol (MEGACE) 20 MG tablet Take 1 tablet (20 mg total) by mouth daily. 06/19/23   CoxFritzi Mandes, MD  Multiple Vitamin (MULTIVITAMIN WITH MINERALS) TABS tablet Take 1 tablet by mouth daily. 10/30/20   Rodolph Bong, MD  naloxone [facility]) nasal spray 4 mg/0.1 mL Place 1 spray into the nose once. 01/12/21   [provider]  nitroGLYCERIN (NITROSTAT) 0.4 MG SL tablet Place 1 tablet (0.4 mg total) under the tongue every 5 (five) minutes as needed for chest pain. 11/27/21   Georgeanna Lea, MD  ondansetron (ZOFRAN) 4 MG tablet Take 1 tablet (4 mg total) by mouth every 8 (eight) hours as needed for nausea or vomiting. 05/17/20   Abigail Miyamoto, MD  oxyCODONE (ROXICODONE) 15 MG immediate release tablet Take 15 mg by mouth 3 (three) times daily as needed for pain. 08/29/21   [provider]  potassium chloride (KLOR-CON) 10 MEQ tablet Take 3 tablets (30 mEq total) by mouth daily for 7 days. 10/29/23 11/05/23  Camnitz, Andree Coss, MD  predniSONE (DELTASONE) 20 MG tablet Take 3 tablets (60 mg total) by mouth daily with breakfast for 3 days, THEN 2 tablets (40 mg total) daily with breakfast for 3 days, THEN 1 tablet (20 mg total) daily with breakfast for 3 days. 11/05/23 11/14/23  [name], FNP  ranolazine (RANEXA) 500 MG 12 hr tablet TAKE 1 TABLET BY MOUTH TWICE DAILY 10/28/23   Georgeanna Lea, MD  rosuvastatin (CRESTOR) 20 MG tablet TAKE 1 TABLET BY MOUTH ONCE DAILY 08/28/23   Cox, Kirsten, MD  sacubitril-valsartan (ENTRESTO)  24-26 MG TAKE ONE (1) TABLET BY MOUTH TWICE DAILY Patient taking differently: Take 1 tablet by mouth 2 (two) times daily. 06/02/23   Cox, Fritzi Mandes, MD  sertraline (ZOLOFT) 100 MG tablet TAKE 1/2 TABLET(50 MG) BY MOUTH DAILY 08/24/23   Cox, Kirsten, MD  tiZANidine (ZANAFLEX) 4 MG tablet Take 4 mg by mouth at bedtime. 11/09/20   [provider]  topiramate (TOPAMAX) 50 MG tablet TAKE TWO (2) TABLETS BY MOUTH EVERY DAY AT BEDTIME Patient taking differently: Take 100 mg by mouth at bedtime. TAKE TWO (2) TABLETS BY MOUTH EVERY DAY AT BEDTIME 06/02/23   Cox, Kirsten, MD  Ubrogepant (UBRELVY) 100 MG TABS Take 1 tablet (100 mg total) by mouth as needed (May repeat after 2 hours.  Maximum 2 tablets in 24 hours). TAKE 1 TABLET BY MOUTH BY MOUTH AS NEEDED( MAY REPEAT 1 TABLET AFTER 2 HOURS IF NEEDED, MAXIMUM 2 TABLETS IN 24 HOURS) Strength: 100 mg 04/07/23   Cox, Kirsten, MD  Vitamin D, Ergocalciferol, (DRISDOL) 1.25 MG (50000 UNIT) CAPS capsule TAKE 1 CAPSULE BY MOUTH EVERY 7 DAYS FOR 12 DOSES Patient taking differently: Take 50,000 Units by mouth every 7 (seven) days. 06/30/23   Blane Ohara, MD    Physical Exam: Vitals:   11/14/23 1407 11/14/23 1415 11/14/23 1631 11/14/23 1900  BP: (!) 140/85  121/83 97/67  Pulse: 91  (!) 101 80  Resp: 20  20 14   Temp: 97.9 F (36.6 C)  97.8 F (36.6 C)   TempSrc: Oral  Oral   SpO2: 100%  95% 93%  Weight:  67 kg    Height:  5\' 1"  (1.549 m)     General: Patient in mild painful distress, otherwise no physiologic distress. Respiratory exam: Bilateral intravesicular Cardiovascular exam S1-S2 normal Abdomen: All quadrants are soft, patient reports chronic lower abdominal tenderness/pain. Extremities: Warm without edema.  Bilateral knee  reflexes 2+.  Bilateral ankle reflexes could not be elicited.  Patient reports good sensation in lower extremities.  Patient demonstrates in constant effort.  I would say 4 out of 5 on all knees and ankle and feet joints.  Bilateral  upper extremity strength seems to be 5 x 5. Data Reviewed:  Labs on Admission:  Results for orders placed or performed during the hospital encounter of 11/14/23 (from the past 24 hours)  Comprehensive metabolic panel     Status: Abnormal   Collection Time: 11/14/23  4:49 PM  Result Value Ref Range   Sodium 135 135 - 145 mmol/L   Potassium 4.5 3.5 - 5.1 mmol/L   Chloride 101 98 - 111 mmol/L   CO2 19 (L) 22 - 32 mmol/L   Glucose, Bld 147 (H) 70 - 99 mg/dL   BUN 83 (H) 8 - 23 mg/dL   Creatinine, Ser 4.03 (H) 0.44 - 1.00 mg/dL   Calcium 9.5 8.9 - 47.4 mg/dL   Total Protein 7.6 6.5 - 8.1 g/dL   Albumin 3.6 3.5 - 5.0 g/dL   AST 24 15 - 41 U/L   ALT 15 0 - 44 U/L   Alkaline Phosphatase 72 38 - 126 U/L   Total Bilirubin 0.5 0.0 - 1.2 mg/dL   GFR, Estimated 18 (L) >60 mL/min   Anion gap 15 5 - 15  CBC with Differential     Status: Abnormal   Collection Time: 11/14/23  4:49 PM  Result Value Ref Range   WBC 12.4 (H) 4.0 - 10.5 K/uL   RBC 4.56 3.87 - 5.11 MIL/uL   Hemoglobin 13.2 12.0 - 15.0 g/dL   HCT 25.9 56.3 - 87.5 %   MCV 91.9 80.0 - 100.0 fL   MCH 28.9 26.0 - 34.0 pg   MCHC 31.5 30.0 - 36.0 g/dL   RDW 64.3 32.9 - 51.8 %   Platelets 361 150 - 400 K/uL   nRBC 0.0 0.0 - 0.2 %   Neutrophils Relative % 86 %   Neutro Abs 10.6 (H) 1.7 - 7.7 K/uL   Lymphocytes Relative 9 %   Lymphs Abs 1.1 0.7 - 4.0 K/uL   Monocytes Relative 3 %   Monocytes Absolute 0.4 0.1 - 1.0 K/uL   Eosinophils Relative 0 %   Eosinophils Absolute 0.0 0.0 - 0.5 K/uL   Basophils Relative 0 %   Basophils Absolute 0.1 0.0 - 0.1 K/uL   Immature Granulocytes 2 %   Abs Immature Granulocytes 0.24 (H) 0.00 - 0.07 K/uL  Urinalysis, Routine w reflex microscopic -Urine, Clean Catch     Status: Abnormal   Collection Time: 11/14/23  8:00 PM  Result Value Ref Range   Color, Urine YELLOW YELLOW   APPearance HAZY (A) CLEAR   Specific Gravity, Urine 1.014 1.005 - 1.030   pH 5.0 5.0 - 8.0   Glucose, UA NEGATIVE NEGATIVE  mg/dL   Hgb urine dipstick NEGATIVE NEGATIVE   Bilirubin Urine NEGATIVE NEGATIVE   Ketones, ur 5 (A) NEGATIVE mg/dL   Protein, ur NEGATIVE NEGATIVE mg/dL   Nitrite NEGATIVE NEGATIVE   Leukocytes,Ua TRACE (A) NEGATIVE   RBC / HPF 0-5 0 - 5 RBC/hpf   WBC, UA 11-20 0 - 5 WBC/hpf   Bacteria, UA RARE (A) NONE SEEN   Squamous Epithelial / HPF 0-5 0 - 5 /HPF   Basic Metabolic Panel: Recent Labs  Lab 11/14/23 1649  NA 135  K 4.5  CL 101  CO2 19*  GLUCOSE  147*  BUN 83*  CREATININE 2.79*  CALCIUM 9.5   Liver Function Tests: Recent Labs  Lab 11/14/23 1649  AST 24  ALT 15  ALKPHOS 72  BILITOT 0.5  PROT 7.6  ALBUMIN 3.6   No results for input(s): "LIPASE", "AMYLASE" in the last 168 hours. No results for input(s): "AMMONIA" in the last 168 hours. CBC: Recent Labs  Lab 11/14/23 1649  WBC 12.4*  NEUTROABS 10.6*  HGB 13.2  HCT 41.9  MCV 91.9  PLT 361   Cardiac Enzymes: No results for input(s): "CKTOTAL", "CKMB", "CKMBINDEX", "TROPONINIHS" in the last 168 hours.  BNP (last 3 results) No results for input(s): "PROBNP" in the last 8760 hours. CBG: No results for input(s): "GLUCAP" in the last 168 hours.  Radiological Exams on Admission:  No results found.    No intake/output data recorded. No intake/output data recorded.    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  Perineal exam was done by ER provider and per report, patient does not have any saddle anesthesia at the site.  However patient has associated urinary retention as well as report of not having bowel movement in the last 4 days as well as a worsening/severe lower back pain.  Maintain the patient on bedrest and proceed with a noncontrast CT of the thoracolumbar spine.  Plan thereafter is to transfer the patient to [facility] in case patient needs CT myelography or an MRI.  AKI (acute kidney injury) (HCC) Per report, patient has previously known obstructing mass near the right ureterovesicular  junction as well as hydronephrosis of the same site.  Also on bladder scanning here, patient had 800 cc of urine which had been drained.  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  And we will monitor patient's clinical response to Foley catheter placement.  Urinalysis shows minimal leukocytes, no protein or elevated white RBCs.  Given association with increased WBCs on her peripheral blood count, I will go ahead and start the patient on ceftriaxone for presumed UTI.  Patient may not have typical symptoms of cystitis given her neurologic problems at this time.    See HPI for priro known illnesses.  Pharmacologic DVT prophylaxis will be deferred as it is felt that the patient may likely need lumbar puncture for CT myelogram.  Medication reconciliation pending pharmacy input  Check INR PTT for lovenox therapy.   Advance Care Planning:   Code Status: Prior full code.  Consults: no official consults at this time. Case was discussed with NES by ER as well as radiology.  Family Communication: per patient.  Severity of Illness: The appropriate patient status for this patient is INPATIENT. Inpatient status is judged to be reasonable and necessary in order to provide the required intensity of service to ensure the patient's safety. The patient's presenting symptoms, physical exam findings, and initial radiographic and laboratory data in the context of their chronic comorbidities is felt to place them at high risk for further clinical deterioration. Furthermore, it is not anticipated that the patient will be medically stable for discharge from the hospital within 2 midnights of admission.   * I certify that at the point of admission it is my clinical judgment that the patient will require inpatient hospital care spanning beyond 2 midnights from the point of admission due to high intensity of service, high risk for further deterioration and high frequency of surveillance required.*  Author: Nolberto Hanlon, MD 11/14/2023 8:04 PM  For on call review [email or web address].

## 2023-11-14 NOTE — Transitions of Care (Post Inpatient/ED Visit) (Signed)
   11/14/2023  Name: [name] MRN: [id number] DOB: Jan 14, 1956  Today's TOC FU Call Status: Today's TOC FU Call Status:: Unsuccessful Call (2nd Attempt) Unsuccessful Call (1st Attempt) Date: 11/11/23 Unsuccessful Call (2nd Attempt) Date: 11/14/23  Attempted to reach the patient regarding the most recent Inpatient/ED visit.  Follow Up Plan: Additional outreach attempts will be made to reach the patient to complete the Transitions of Care (Post Inpatient/ED visit) call.   Signature Karena Addison, LPN [facility] Nurse Health Advisor Direct Dial [phone]

## 2023-11-14 NOTE — ED Notes (Signed)
 Purewick applied and brief changed.

## 2023-11-14 NOTE — ED Provider Notes (Signed)
 [place] EMERGENCY DEPARTMENT AT [facility] Provider Note   CSN: [id number] Arrival date & time: 11/14/23  1401     History  Chief Complaint  Patient presents with   Flank Pain    ARNESHIA ADE is a 68 y.o. female with reported recent diagnosis of bladder cancer, spinal stenosis, presented to ED with complaint of back pain and weakness and ability to walk.  Patient reports his symptoms began about 4 days ago.  She denies any immediate preceding falls or injuries.  She says she has chronic pain in her back but it is worsened.  She says she is weak and cannot stand up on her legs.  She also reports he has been having difficult time urinating and says she had an episode of incontinence.  She denies new numbness in her legs.  She says she was recently diagnosed with bladder cancer and is currently being worked up for it but has not had a biopsy yet.  CT March 4 reviewed showing concern for bladder wall thickening and neoplasm as well as obstructing mass near the right UVJ and hydronephrosis, and concern for bony metastasis of the pubic rami or suspicious lesions. Patient also has an age-indeterminate T7 compression fracture.  HPI     Home Medications Prior to Admission medications   Medication Sig Start Date End Date Taking? Authorizing Provider  ARIPiprazole (ABILIFY) 5 MG tablet TAKE 1 TABLET BY MOUTH DAILY 10/07/23  Yes Cox, Kirsten, MD  aspirin 81 MG tablet Take 81 mg by mouth daily.   Yes [provider]  BREO ELLIPTA 100-25 MCG/ACT AEPB INHALE 1 PUFF INTO THE LUNGS DAILY. Patient taking differently: Inhale 1 puff into the lungs daily as needed. 06/03/23  Yes Craft, Huston Foley, PA  Buprenorphine HCl (BELBUCA) 900 MCG FILM Place 900 mcg inside cheek every 12 (twelve) hours.   Yes [provider]  carvedilol (COREG) 3.125 MG tablet TAKE ONE (1) TABLET BY MOUTH TWICE DAILY WITH MEALS Patient taking differently: Take 3.125 mg by mouth 2 (two) times daily with a  meal. 06/02/23  Yes Cox, Kirsten, MD  cefdinir (OMNICEF) 300 MG capsule Take 300 mg by mouth 2 (two) times daily. 11/10/23  Yes [provider]  denosumab (PROLIA) 60 MG/ML SOSY injection Inject 60 mg into the skin every 6 (six) months. 02/14/20  Yes Abigail Miyamoto, MD  diclofenac Sodium (VOLTAREN) 1 % GEL Apply 2 g topically 4 (four) times daily. Patient taking differently: Apply 2 g topically as needed (Pain). 05/01/21  Yes Abigail Miyamoto, MD  EPINEPHRINE 0.3 mg/0.3 mL IJ SOAJ injection Inject 0.3 mLs (0.3 mg total) into the muscle as needed for anaphylaxis. 04/27/20  Yes Abigail Miyamoto, MD  esomeprazole (NEXIUM) 20 MG capsule TAKE ONE (1) CAPSULE BY MOUTH ONCE DAILY 08/28/23  Yes Cox, Kirsten, MD  FARXIGA 5 MG TABS tablet Take 5 mg by mouth daily. 10/10/23  Yes [provider]  fluticasone (FLONASE) 50 MCG/ACT nasal spray USE 2 SPRAYS IN EACH NOSTRILS DAILY AS NEEDED Patient taking differently: Place 2 sprays into both nostrils daily as needed for allergies or rhinitis. 07/18/23  Yes [name], FNP  furosemide (LASIX) 40 MG tablet Take 1 tablet (40 mg total) by mouth daily. 06/25/23 11/14/23 Yes Georgeanna Lea, MD  gabapentin (NEURONTIN) 800 MG tablet Take 1 tablet (800 mg total) by mouth 3 (three) times daily. Patient usually takes twice a day 01/21/20  Yes Abigail Miyamoto, MD  Galcanezumab-gnlm [facility])  120 MG/ML SOSY Inject 120 mg into the skin every 30 (thirty) days. 01/06/23  Yes Cox, Kirsten, MD  ipratropium-albuterol (DUONEB) 0.5-2.5 (3) MG/3ML SOLN USE 1 VIAL VIA NEBULIZER EVERY 6 HOURS AS NEEDED FOR SHORTNESS OF BREATH 10/16/23  Yes [name], FNP  levocetirizine (XYZAL) 5 MG tablet Take 5 mg by mouth daily as needed for allergies. 12/13/19  Yes [provider]  lubiprostone (AMITIZA) 24 MCG capsule TAKE 1 CAPSULE(24 MCG) BY MOUTH TWICE DAILY WITH A MEAL 09/15/23  Yes Cox, Kirsten, MD  megestrol (MEGACE) 20 MG tablet Take 1 tablet  (20 mg total) by mouth daily. 06/19/23  Yes Cox, Kirsten, MD  Multiple Vitamin (MULTIVITAMIN WITH MINERALS) TABS tablet Take 1 tablet by mouth daily. 10/30/20  Yes Rodolph Bong, MD  naloxone [facility]) nasal spray 4 mg/0.1 mL Place 1 spray into the nose once. 01/12/21  Yes [provider]  nitrofurantoin, macrocrystal-monohydrate, (MACROBID) 100 MG capsule Take 100 mg by mouth 2 (two) times daily. 11/10/23  Yes [provider]  nitroGLYCERIN (NITROSTAT) 0.4 MG SL tablet Place 1 tablet (0.4 mg total) under the tongue every 5 (five) minutes as needed for chest pain. 11/27/21  Yes Georgeanna Lea, MD  oxyCODONE (ROXICODONE) 15 MG immediate release tablet Take 15 mg by mouth every 8 (eight) hours as needed for pain. 08/29/21  Yes [provider]  potassium chloride (KLOR-CON) 10 MEQ tablet Take 3 tablets (30 mEq total) by mouth daily for 7 days. 10/29/23 11/14/23 Yes Camnitz, Will Daphine Deutscher, MD  predniSONE (DELTASONE) 20 MG tablet Take 3 tablets (60 mg total) by mouth daily with breakfast for 3 days, THEN 2 tablets (40 mg total) daily with breakfast for 3 days, THEN 1 tablet (20 mg total) daily with breakfast for 3 days. 11/05/23 11/14/23 Yes [name], FNP  ranolazine (RANEXA) 500 MG 12 hr tablet TAKE 1 TABLET BY MOUTH TWICE DAILY 10/28/23  Yes Georgeanna Lea, MD  rosuvastatin (CRESTOR) 20 MG tablet TAKE 1 TABLET BY MOUTH ONCE DAILY 08/28/23  Yes Cox, Kirsten, MD  sacubitril-valsartan (ENTRESTO) 24-26 MG TAKE ONE (1) TABLET BY MOUTH TWICE DAILY Patient taking differently: Take 1 tablet by mouth 2 (two) times daily. 06/02/23  Yes Cox, Kirsten, MD  sertraline (ZOLOFT) 100 MG tablet TAKE 1/2 TABLET(50 MG) BY MOUTH DAILY 08/24/23  Yes Cox, Kirsten, MD  tiZANidine (ZANAFLEX) 4 MG tablet Take 4 mg by mouth at bedtime. 11/09/20  Yes [provider]  topiramate (TOPAMAX) 50 MG tablet TAKE TWO (2) TABLETS BY MOUTH EVERY DAY AT BEDTIME Patient taking differently: Take 100 mg by  mouth at bedtime. TAKE TWO (2) TABLETS BY MOUTH EVERY DAY AT BEDTIME 06/02/23  Yes Cox, Kirsten, MD  Ubrogepant (UBRELVY) 100 MG TABS Take 1 tablet (100 mg total) by mouth as needed (May repeat after 2 hours.  Maximum 2 tablets in 24 hours). TAKE 1 TABLET BY MOUTH BY MOUTH AS NEEDED( MAY REPEAT 1 TABLET AFTER 2 HOURS IF NEEDED, MAXIMUM 2 TABLETS IN 24 HOURS) Strength: 100 mg 04/07/23  Yes Cox, Kirsten, MD  Vitamin D, Ergocalciferol, (DRISDOL) 1.25 MG (50000 UNIT) CAPS capsule TAKE 1 CAPSULE BY MOUTH EVERY 7 DAYS FOR 12 DOSES Patient taking differently: Take 50,000 Units by mouth every 7 (seven) days. Patient usually takes this medication on mondays 06/30/23  Yes Cox, Kirsten, MD  dextromethorphan 15 MG/5ML syrup Take 10 mLs (30 mg total) by mouth 4 (four) times daily as needed for cough. Patient not taking: Reported  on 11/14/2023 10/22/23   [name], FNP  guaiFENesin-codeine 100-10 MG/5ML syrup Take 5 mLs by mouth 3 (three) times daily as needed for cough. 10/16/23   [name], FNP  ondansetron (ZOFRAN) 4 MG tablet Take 1 tablet (4 mg total) by mouth every 8 (eight) hours as needed for nausea or vomiting. 05/17/20   Abigail Miyamoto, MD      Allergies    Patient has no known allergies.    Review of Systems   Review of Systems  Physical Exam Updated Vital Signs BP (!) 111/93 (BP Location: Right Arm)   Pulse 72   Temp 98 F (36.7 C) (Oral)   Resp 16   Ht 5\' 1"  (1.549 m)   Wt 67 kg   SpO2 99%   BMI 27.91 kg/m  Physical Exam Constitutional:      General: She is not in acute distress. HENT:     Head: Normocephalic and atraumatic.  Eyes:     Conjunctiva/sclera: Conjunctivae normal.     Pupils: Pupils are equal, round, and reactive to light.  Cardiovascular:     Rate and Rhythm: Normal rate and regular rhythm.  Pulmonary:     Effort: Pulmonary effort is normal. No respiratory distress.  Abdominal:     General: There is no distension.     Tenderness: There is no abdominal  tenderness.     Comments: Suprapubic distension and discomfort  Skin:    General: Skin is warm and dry.  Neurological:     General: No focal deficit present.     Mental Status: She is alert. Mental status is at baseline.     Comments: Symmetrical 2 out of 5 strength in the lower extremities hip flexion, 3/5 strength ankle plantar and dorsiflexion, sensation is grossly intact, no saddle anesthesia, normal rectal tone  Psychiatric:        Mood and Affect: Mood normal.        Behavior: Behavior normal.     ED Results / Procedures / Treatments   Labs (all labs ordered are listed, but only abnormal results are displayed) Labs Reviewed  URINALYSIS, ROUTINE W REFLEX MICROSCOPIC - Abnormal; Notable for the following components:      Result Value   APPearance HAZY (*)    Ketones, ur 5 (*)    Leukocytes,Ua TRACE (*)    Bacteria, UA RARE (*)    All other components within normal limits  COMPREHENSIVE METABOLIC PANEL - Abnormal; Notable for the following components:   CO2 19 (*)    Glucose, Bld 147 (*)    BUN 83 (*)    Creatinine, Ser 2.79 (*)    GFR, Estimated 18 (*)    All other components within normal limits  CBC WITH DIFFERENTIAL/PLATELET - Abnormal; Notable for the following components:   WBC 12.4 (*)    Neutro Abs 10.6 (*)    Abs Immature Granulocytes 0.24 (*)    All other components within normal limits  CULTURE, OB URINE  HIV ANTIBODY (ROUTINE TESTING W REFLEX)  APTT  PROTIME-INR  BASIC METABOLIC PANEL  CBC    EKG None  Radiology CT THORACIC SPINE WO CONTRAST Result Date: 11/14/2023 CLINICAL DATA:  Mid-back pain, abnormal neuro, positive xray (Ped 0-17y); Low back pain, cauda equina syndrome suspected EXAM: CT THORACIC AND LUMBAR SPINE WITHOUT CONTRAST TECHNIQUE: Multidetector CT imaging of the thoracic and lumbar spine was performed without contrast. Multiplanar CT image reconstructions were also generated. RADIATION DOSE REDUCTION: This exam was  performed according to  the departmental dose-optimization program which includes automated exposure control, adjustment of the mA and/or kV according to patient size and/or use of iterative reconstruction technique. COMPARISON:  MRI lumbar spine 12/12/2008, CT chest 10/17/2023: CT abdomen pelvis 10/28/2023 FINDINGS: CT THORACIC SPINE FINDINGS Alignment: Normal. Vertebrae: Diffusely decreased bone density. Interval development of an age-indeterminate T1 compression fracture with at least 40% vertebral body height loss. Chronic stable T5, T7 compression fracture. Redemonstration of scattered sclerotic axial skeleton lesions. Multilevel mild degenerative changes of the spine. Paraspinal and other soft tissues: Negative. Disc levels: Maintained. CT LUMBAR SPINE FINDINGS Segmentation: 5 lumbar type vertebrae. Alignment: Normal. Vertebrae: Diffusely decreased bone density. No severe osseous neural foraminal or central canal stenosis. L3-L4, L4-L5, L5-S1 disc bulge with question at least moderate central canal stenosis at the L4-L5 level. No acute fracture or focal pathologic process. Paraspinal and other soft tissues: Negative. Disc levels: Maintained. Other:. Diffuse bronchial wall thickening. Limited evaluation of the lungs due to motion artifact. Severe right hydroureteronephrosis that is partially visualized. Atherosclerotic plaque. Cardiac leads. Status post cholecystectomy. Acute minimally displaced right posterior first rib fracture (5:21). IMPRESSION: IMPRESSION CT THORACIC SPINE IMPRESSION 1. No acute displaced fracture or traumatic listhesis of the thoracic spine. 2. Redemonstration of scattered sclerotic axial skeleton lesions. Question metastases. CT LUMBAR SPINE IMPRESSION 1. No acute displaced fracture or traumatic listhesis of the lumbar spine. 2. L3-L4, L4-L5, L5-S1 disc bulge with question at least moderate central canal stenosis at the L4-L5 level. Recommend MRI for further evaluation. Other imaging findings of potential  clinical significance: 1. Acute minimally displaced right posterior first rib fracture 2. Severe right hydroureteronephrosis partially visualized. Finding better evaluated on CT abdomen pelvis 10/28/2023 3.  Aortic Atherosclerosis (ICD10-I70.0). Electronically Signed   By: Tish Frederickson M.D.   On: 11/14/2023 20:53   CT LUMBAR SPINE WO CONTRAST Result Date: 11/14/2023 CLINICAL DATA:  Mid-back pain, abnormal neuro, positive xray (Ped 0-17y); Low back pain, cauda equina syndrome suspected EXAM: CT THORACIC AND LUMBAR SPINE WITHOUT CONTRAST TECHNIQUE: Multidetector CT imaging of the thoracic and lumbar spine was performed without contrast. Multiplanar CT image reconstructions were also generated. RADIATION DOSE REDUCTION: This exam was performed according to the departmental dose-optimization program which includes automated exposure control, adjustment of the mA and/or kV according to patient size and/or use of iterative reconstruction technique. COMPARISON:  MRI lumbar spine 12/12/2008, CT chest 10/17/2023: CT abdomen pelvis 10/28/2023 FINDINGS: CT THORACIC SPINE FINDINGS Alignment: Normal. Vertebrae: Diffusely decreased bone density. Interval development of an age-indeterminate T1 compression fracture with at least 40% vertebral body height loss. Chronic stable T5, T7 compression fracture. Redemonstration of scattered sclerotic axial skeleton lesions. Multilevel mild degenerative changes of the spine. Paraspinal and other soft tissues: Negative. Disc levels: Maintained. CT LUMBAR SPINE FINDINGS Segmentation: 5 lumbar type vertebrae. Alignment: Normal. Vertebrae: Diffusely decreased bone density. No severe osseous neural foraminal or central canal stenosis. L3-L4, L4-L5, L5-S1 disc bulge with question at least moderate central canal stenosis at the L4-L5 level. No acute fracture or focal pathologic process. Paraspinal and other soft tissues: Negative. Disc levels: Maintained. Other:. Diffuse bronchial wall  thickening. Limited evaluation of the lungs due to motion artifact. Severe right hydroureteronephrosis that is partially visualized. Atherosclerotic plaque. Cardiac leads. Status post cholecystectomy. Acute minimally displaced right posterior first rib fracture (5:21). IMPRESSION: IMPRESSION CT THORACIC SPINE IMPRESSION 1. No acute displaced fracture or traumatic listhesis of the thoracic spine. 2. Redemonstration of scattered sclerotic axial skeleton lesions. Question metastases. CT LUMBAR SPINE IMPRESSION  1. No acute displaced fracture or traumatic listhesis of the lumbar spine. 2. L3-L4, L4-L5, L5-S1 disc bulge with question at least moderate central canal stenosis at the L4-L5 level. Recommend MRI for further evaluation. Other imaging findings of potential clinical significance: 1. Acute minimally displaced right posterior first rib fracture 2. Severe right hydroureteronephrosis partially visualized. Finding better evaluated on CT abdomen pelvis 10/28/2023 3.  Aortic Atherosclerosis (ICD10-I70.0). Electronically Signed   By: Tish Frederickson M.D.   On: 11/14/2023 20:53    Procedures Procedures    Medications Ordered in ED Medications  cefTRIAXone (ROCEPHIN) 1 g in sodium chloride 0.9 % 100 mL IVPB (0 g Intravenous Stopped 11/14/23 2253)  oxyCODONE (Oxy IR/ROXICODONE) immediate release tablet 5 mg (5 mg Oral Given 11/14/23 2232)  senna-docusate (Senokot-S) tablet 2 tablet (2 tablets Oral Given 11/14/23 2232)  fluticasone furoate-vilanterol (BREO ELLIPTA) 100-25 MCG/ACT 1 puff (1 puff Inhalation Not Given 11/14/23 2119)  ipratropium-albuterol (DUONEB) 0.5-2.5 (3) MG/3ML nebulizer solution 3 mL (has no administration in time range)  acetaminophen (TYLENOL) tablet 650 mg (has no administration in time range)    Or  acetaminophen (TYLENOL) suppository 650 mg (has no administration in time range)  polyethylene glycol (MIRALAX / GLYCOLAX) packet 17 g (has no administration in time range)  sodium chloride  flush (NS) 0.9 % injection 3 mL (has no administration in time range)    ED Course/ Medical Decision Making/ A&P Clinical Course as of 11/14/23 2309  Fri Nov 14, 2023  1800 Normal rectal tone, no saddle anesthesia [MT]  1823 I spoke to Dr Franky Macho from neurosurgery regarding patient's lower extremity weakness.  Per review of CT imaging from march 4 with Dr Alfredo Batty from radiology, patient has an L4 disc herniation, but no large evident tumor/mass on the spine.  This disc lesion does not correlate with her neurological exam and weakness involving the quadriceps and upper legs, as well as her lower legs. She would benefit from spinal imaging but has a [employer] pacemaker that is confirmed to be incompatible with MRI according to our MRI technician staff.  As an alternative, she will have a CT myelogram performed at [facility] tomorrow by the radiology department, as confirmed by Dr Alfredo Batty today, and if there are concerning findings, the neurosurgery came can be consulted following the imaging. [MT]  1858 Dr Sande Brothers urology reports they will consult inpatient but pt not needing emergent stent or OR -- okay to leave foley in place for now [MT]  1912 800 cc retained urine , RN to place foley now, send UA. Admitted to hospitalist - plan discussed for [facility] transfer, CT myelogram (to be ordered by hospitalist) tomorrow, urology consult placed, and NSGY consult as indicated following CT imaging [MT]    Clinical Course User Index [MT] Laurine Kuyper, Kermit Balo, MD                                 Medical Decision Making Amount and/or Complexity of Data Reviewed Labs: ordered.  Risk Decision regarding hospitalization.   This patient presents to the ED with concern for new onset lower extremity weakness, reported urinary incontinence. This involves an extensive number of treatment options, and is a complaint that carries with it a high risk of complications and morbidity.  The differential diagnosis includes  myelopathy for spinal cord lesion versus metabolic derangement versus other  Co-morbidities that complicate the patient evaluation: History of malignancy at high risk of  metastatic disease or lesions  External records from outside source obtained and reviewed including CT imaging from March 4 as noted above  I ordered and personally interpreted labs.  The pertinent results include:  AKI Cr 2.8, BUN 83, WBC 12.4  The patient was maintained on a cardiac monitor.  I personally viewed and interpreted the cardiac monitored which showed an underlying rhythm of: NSR  I have reviewed the patients home medicines and have made adjustments as needed  Test Considered: MRI was considered for spinal evaluation but patient has noncompatible ICD pacemaker   I requested consultation with the urology, NSGY,  and discussed lab and imaging findings as well as pertinent plan - they recommend: see ed course  After the interventions noted above, I reevaluated the patient and found that they have: improved  Dispostion:  After consideration of the diagnostic results and the patients response to treatment, I feel that the patent would benefit from medical admission         Final Clinical Impression(s) / ED Diagnoses Final diagnoses:  Urinary retention  AKI (acute kidney injury) [facility])    Rx / DC Orders ED Discharge Orders     None         Terald Sleeper, MD 11/14/23 2309

## 2023-11-14 NOTE — Progress Notes (Signed)
 CT LS spine - concern for cauda equina remains. Ordered both for CT myelogram as well as MRI LS spine - whichever can be done first.

## 2023-11-14 NOTE — ED Triage Notes (Signed)
 Patient presented to ER with flank pain and lower extremity pain. Per EMS report: Patient was recently at hospital discharged 3/16 with antibiotics for UTI/bladder infection/kidney infection.

## 2023-11-14 NOTE — Assessment & Plan Note (Signed)
 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  Also on bladder scanning here, patient had 800 cc of urine which had been drained.  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  And we will monitor patient's clinical response to Foley catheter placement.  Urinalysis shows minimal leukocytes, no protein or elevated white RBCs.  Given association with increased WBCs on her peripheral blood count, I will go ahead and start the patient on ceftriaxone for presumed UTI.  Patient may not have typical symptoms of cystitis given her neurologic problems at this time.

## 2023-11-15 ENCOUNTER — Inpatient Hospital Stay (HOSPITAL_COMMUNITY)

## 2023-11-15 DIAGNOSIS — T426X1A Poisoning by other antiepileptic and sedative-hypnotic drugs, accidental (unintentional), initial encounter: Secondary | ICD-10-CM | POA: Diagnosis not present

## 2023-11-15 DIAGNOSIS — R892 Abnormal level of other drugs, medicaments and biological substances in specimens from other organs, systems and tissues: Secondary | ICD-10-CM | POA: Diagnosis not present

## 2023-11-15 DIAGNOSIS — R278 Other lack of coordination: Secondary | ICD-10-CM | POA: Diagnosis not present

## 2023-11-15 DIAGNOSIS — G8220 Paraplegia, unspecified: Secondary | ICD-10-CM | POA: Diagnosis not present

## 2023-11-15 LAB — BASIC METABOLIC PANEL
Anion gap: 9 (ref 5–15)
BUN: 79 mg/dL — ABNORMAL HIGH (ref 8–23)
CO2: 21 mmol/L — ABNORMAL LOW (ref 22–32)
Calcium: 8.6 mg/dL — ABNORMAL LOW (ref 8.9–10.3)
Chloride: 106 mmol/L (ref 98–111)
Creatinine, Ser: 2.49 mg/dL — ABNORMAL HIGH (ref 0.44–1.00)
GFR, Estimated: 20 mL/min — ABNORMAL LOW (ref >60)
Glucose, Bld: 125 mg/dL — ABNORMAL HIGH (ref 70–99)
Potassium: 4.1 mmol/L (ref 3.5–5.1)
Sodium: 136 mmol/L (ref 135–145)

## 2023-11-15 LAB — CBC
HCT: 35.7 % — ABNORMAL LOW (ref 36.0–46.0)
Hemoglobin: 11.5 g/dL — ABNORMAL LOW (ref 12.0–15.0)
MCH: 29.1 pg (ref 26.0–34.0)
MCHC: 32.2 g/dL (ref 30.0–36.0)
MCV: 90.4 fL (ref 80.0–100.0)
Platelets: 331 10*3/uL (ref 150–400)
RBC: 3.95 MIL/uL (ref 3.87–5.11)
RDW: 14.3 % (ref 11.5–15.5)
WBC: 11.8 10*3/uL — ABNORMAL HIGH (ref 4.0–10.5)
nRBC: 0 % (ref 0.0–0.2)

## 2023-11-15 LAB — PROTIME-INR
INR: 1.1 (ref 0.8–1.2)
Prothrombin Time: 14.7 s (ref 11.4–15.2)

## 2023-11-15 LAB — APTT: aPTT: 26 s (ref 24–36)

## 2023-11-15 LAB — HIV ANTIBODY (ROUTINE TESTING W REFLEX): HIV Screen 4th Generation wRfx: NONREACTIVE

## 2023-11-15 MED ORDER — SODIUM CHLORIDE 0.9 % IV SOLN
INTRAVENOUS | Status: DC
  Administered 2023-11-17 – 2023-11-18 (×2): 75 mL/h via INTRAVENOUS

## 2023-11-15 MED ORDER — IOHEXOL 180 MG/ML  SOLN
20.0000 mL | Freq: Once | INTRAMUSCULAR | Status: AC | PRN
Start: 2023-11-15 — End: 2023-11-15
  Administered 2023-11-15: 15 mL via INTRATHECAL

## 2023-11-15 MED ORDER — NALOXONE HCL 0.4 MG/ML IJ SOLN: 0.4000 mg | INTRAMUSCULAR | Status: DC | PRN

## 2023-11-15 MED ORDER — POLYETHYLENE GLYCOL 3350 17 G PO PACK
17.0000 g | PACK | Freq: Every day | ORAL | Status: DC
  Administered 2023-11-15 – 2023-11-18 (×3): 17 g via ORAL
  Filled 2023-11-15 (×6): qty 1

## 2023-11-15 MED ORDER — DEXAMETHASONE SODIUM PHOSPHATE 10 MG/ML IJ SOLN
6.0000 mg | INTRAMUSCULAR | Status: DC
  Administered 2023-11-15 – 2023-11-23 (×9): 6 mg via INTRAVENOUS
  Filled 2023-11-15 (×6): qty 1
  Filled 2023-11-15: qty 0.6
  Filled 2023-11-15 (×2): qty 1

## 2023-11-15 MED ORDER — HYDROMORPHONE HCL 1 MG/ML IJ SOLN
0.5000 mg | INTRAMUSCULAR | Status: DC | PRN
  Administered 2023-11-15 – 2023-11-24 (×42): 0.5 mg via INTRAVENOUS
  Filled 2023-11-15 (×42): qty 0.5

## 2023-11-15 MED ORDER — LIDOCAINE HCL (PF) 1 % IJ SOLN
5.0000 mL | Freq: Once | INTRAMUSCULAR | Status: AC
  Administered 2023-11-15: 9 mL via INTRADERMAL
  Filled 2023-11-15: qty 5

## 2023-11-15 MED ORDER — SODIUM CHLORIDE 0.9 % IV BOLUS
250.0000 mL | Freq: Once | INTRAVENOUS | Status: AC
  Administered 2023-11-15: 250 mL via INTRAVENOUS

## 2023-11-15 MED ORDER — DEXAMETHASONE SODIUM PHOSPHATE 10 MG/ML IJ SOLN: 6.0000 mg | Freq: Once | INTRAMUSCULAR | Status: DC

## 2023-11-15 MED ORDER — LIDOCAINE 5 % EX PTCH
1.0000 | MEDICATED_PATCH | CUTANEOUS | Status: DC
  Administered 2023-11-15 – 2023-11-23 (×9): 1 via TRANSDERMAL
  Filled 2023-11-15 (×9): qty 1

## 2023-11-15 MED ORDER — CHLORHEXIDINE GLUCONATE CLOTH 2 % EX PADS
6.0000 | MEDICATED_PAD | Freq: Every day | CUTANEOUS | Status: DC
  Administered 2023-11-15 – 2023-11-24 (×9): 6 via TOPICAL

## 2023-11-15 NOTE — Consult Note (Signed)
 Urology Consult   Physician requesting consult: Burnadette Pop, MD   Reason for consult: Right hydronephrosis and bladder mass  History of Present Illness: [name] is a 68 y.o. female who presented to the [facility] emergency department on 11/14/2023 with worsening low back pain radiating into her lower extremities causing immobility and urinary retention.  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  She was also noted to have a pelvic bony lesion concerning for metastatic disease on her most recent CT.  She is followed by Dr. Saddie Benders in [place] and is in the process of being scheduled for a TURBT in the near future.  This morning, the patient reports persistent low back pain that radiates into both lower extremities and prohibits her from ambulating.  She is able to move both lower extremity, but without significant pain.  The patient denies a history of voiding or storage urinary symptoms, hematuria, UTIs, STDs, urolithiasis, GU malignancy/trauma/surgery.  Previously smoked 1 pack/day for several years, but states that she quit 1 year ago.  Past Medical History:  Diagnosis Date   Abdominal pain 10/17/2020   Abnormality of gait due to impairment of balance 11/22/2020   Acute diverticulitis 10/17/2020   Admission for long-term opiate analgesic use 10/24/2019   Arthritis of right hip 05/29/2016   Formatting of this note might be different from the original. Added automatically from request for surgery [id number]   BMI 26.0-26.9,adult 03/29/2020   Bronchial asthma    Cardiomyopathy (HCC)    Overview:  Ejection fraction 45% in 2015 Ejection fraction 30 to 35% in November 2018   Chronic back pain    Chronic constipation 07/31/2021   Chronic hypoxemic respiratory failure  (HCC) 10/24/2019   Chronic narcotic use 03/23/2015   Chronic pain of both knees 12/21/2018   Added automatically from request for surgery [id number]  Formatting of this note might be different from the original. 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   Hypotension 05/17/2020   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   Ileus following gastrointestinal surgery (HCC) 03/26/2015   Major depressive disorder, single episode, moderate (HCC) 10/24/2019   Malnutrition of moderate degree (HCC) 10/20/2020   Mesenteric ischemia (HCC) 10/16/2020   Migraine without aura with status migrainosus 10/24/2019   Mixed incontinence 10/20/2020   Myocardial infarction [facility])    Nausea & vomiting    Obstructive chronic bronchitis with exacerbation (HCC) 10/25/2019   Other spondylosis with radiculopathy, lumbar region 10/24/2019   Persistent vomiting 05/17/2020   Presence of left artificial hip joint 10/24/2019   PTSD (post-traumatic stress disorder)    Recurrent incisional hernias with  incarceration s/p lap repair w mesh 03/23/2015 04/19/2014   Senile osteoporosis 10/24/2019   Sepsis (HCC) 10/17/2020   Serosanguineous chronic otitis media of right ear  08/02/2021   Small bowel obstruction (HCC)    Thyroid disease    Upper respiratory tract infection due to COVID-19 virus 07/06/2021   Wellness examination 03/27/2021    Past Surgical History:  Procedure Laterality Date   APPENDECTOMY     CARDIAC CATHETERIZATION     CARPAL TUNNEL RELEASE     CESAREAN SECTION     CHF s/p AICD   12/2010   CHOLECYSTECTOMY     COLONOSCOPY  10/16/2015   Mild colonic diverticulosis, predominantly in the left colon. Small internal hemrrhoids. Otherwise normal colonoscopy   COLONOSCOPY WITH PROPOFOL N/A 09/27/2021   Procedure: COLONOSCOPY WITH PROPOFOL;  Surgeon: Napoleon Form, MD;  Location: [facility] ENDOSCOPY;  Service: Endoscopy;  Laterality: N/A;   CORONARY ANGIOPLASTY     ESOPHAGOGASTRODUODENOSCOPY  02/04/2014   Mild gastritis. Otherwise normal EGD   HAND SURGERY Bilateral    ICD GENERATOR CHANGEOUT N/A 05/21/2019   Procedure: ICD GENERATOR CHANGEOUT;  Surgeon: Regan Lemming, MD;  Location: [facility] INVASIVE CV LAB;  Service: Cardiovascular;  Laterality: N/A;   ICD IMPLANT     Medtronic   intestinal blockage 2011     LAPAROSCOPIC ASSISTED VENTRAL HERNIA REPAIR N/A 03/23/2015   Procedure: LAPAROSCOPIC VENTRAL WALL HERNIA REPAIR;  Surgeon: Karie Soda, MD;  Location: [facility] ORS;  Service: General;  Laterality: N/A;  With MESH   LAPAROSCOPIC LYSIS OF ADHESIONS N/A 03/23/2015   Procedure: LAPAROSCOPIC LYSIS OF ADHESIONS;  Surgeon: Karie Soda, MD;  Location: [facility] ORS;  Service: General;  Laterality: N/A;   LSCS      x2   NASAL SEPTUM SURGERY     NECK SURGERY     fused   TONSILLECTOMY     TYMPANOSTOMY TUBE PLACEMENT Right 2024   ULNAR NERVE TRANSPOSITION  01/23/2012   Procedure: ULNAR NERVE DECOMPRESSION/TRANSPOSITION;  Surgeon: Cristi Loron, MD;  Location: [facility] NEURO ORS;  Service: Neurosurgery;  Laterality: Left;  LEFT ulnar nerve decompression    Current Hospital Medications:  Home Meds:  Current Meds  Medication Sig   ARIPiprazole (ABILIFY) 5  MG tablet TAKE 1 TABLET BY MOUTH DAILY   aspirin 81 MG tablet Take 81 mg by mouth daily.   BREO ELLIPTA 100-25 MCG/ACT AEPB INHALE 1 PUFF INTO THE LUNGS DAILY. (Patient taking differently: Inhale 1 puff into the lungs daily as needed.)   Buprenorphine HCl (BELBUCA) 900 MCG FILM Place 900 mcg inside cheek every 12 (twelve) hours.   carvedilol (COREG) 3.125 MG tablet TAKE ONE (1) TABLET BY MOUTH TWICE DAILY WITH MEALS (Patient taking differently: Take 3.125 mg by mouth 2 (two) times daily with a meal.)   cefdinir (OMNICEF) 300 MG capsule Take 300 mg by mouth 2 (two) times daily.   denosumab (PROLIA) 60 MG/ML SOSY injection Inject 60 mg into the skin every 6 (six) months.   diclofenac Sodium (VOLTAREN) 1 % GEL Apply 2 g topically 4 (four) times daily. (Patient taking differently: Apply 2 g topically as needed (Pain).)   EPINEPHRINE 0.3 mg/0.3 mL IJ SOAJ injection Inject 0.3 mLs (0.3 mg total) into the muscle as needed for anaphylaxis.   esomeprazole (NEXIUM) 20 MG capsule TAKE ONE (1) CAPSULE BY MOUTH ONCE DAILY   FARXIGA 5 MG TABS tablet Take 5 mg by mouth daily.   fluticasone (FLONASE) 50 MCG/ACT nasal spray USE 2 SPRAYS IN  EACH NOSTRILS DAILY AS NEEDED (Patient taking differently: Place 2 sprays into both nostrils daily as needed for allergies or rhinitis.)   furosemide (LASIX) 40 MG tablet Take 1 tablet (40 mg total) by mouth daily.   gabapentin (NEURONTIN) 800 MG tablet Take 1 tablet (800 mg total) by mouth 3 (three) times daily. Patient usually takes twice a day   Galcanezumab-gnlm (EMGALITY) 120 MG/ML SOSY Inject 120 mg into the skin every 30 (thirty) days.   ipratropium-albuterol (DUONEB) 0.5-2.5 (3) MG/3ML SOLN USE 1 VIAL VIA NEBULIZER EVERY 6 HOURS AS NEEDED FOR SHORTNESS OF BREATH   levocetirizine (XYZAL) 5 MG tablet Take 5 mg by mouth daily as needed for allergies.   lubiprostone (AMITIZA) 24 MCG capsule TAKE 1 CAPSULE(24 MCG) BY MOUTH TWICE DAILY WITH A MEAL   megestrol (MEGACE) 20 MG  tablet Take 1 tablet (20 mg total) by mouth daily.   Multiple Vitamin (MULTIVITAMIN WITH MINERALS) TABS tablet Take 1 tablet by mouth daily.   naloxone (NARCAN) nasal spray 4 mg/0.1 mL Place 1 spray into the nose once.   nitrofurantoin, macrocrystal-monohydrate, (MACROBID) 100 MG capsule Take 100 mg by mouth 2 (two) times daily.   nitroGLYCERIN (NITROSTAT) 0.4 MG SL tablet Place 1 tablet (0.4 mg total) under the tongue every 5 (five) minutes as needed for chest pain.   oxyCODONE (ROXICODONE) 15 MG immediate release tablet Take 15 mg by mouth every 8 (eight) hours as needed for pain.   potassium chloride (KLOR-CON) 10 MEQ tablet Take 3 tablets (30 mEq total) by mouth daily for 7 days.   [EXPIRED] predniSONE (DELTASONE) 20 MG tablet Take 3 tablets (60 mg total) by mouth daily with breakfast for 3 days, THEN 2 tablets (40 mg total) daily with breakfast for 3 days, THEN 1 tablet (20 mg total) daily with breakfast for 3 days.   ranolazine (RANEXA) 500 MG 12 hr tablet TAKE 1 TABLET BY MOUTH TWICE DAILY   rosuvastatin (CRESTOR) 20 MG tablet TAKE 1 TABLET BY MOUTH ONCE DAILY   sacubitril-valsartan (ENTRESTO) 24-26 MG TAKE ONE (1) TABLET BY MOUTH TWICE DAILY (Patient taking differently: Take 1 tablet by mouth 2 (two) times daily.)   sertraline (ZOLOFT) 100 MG tablet TAKE 1/2 TABLET(50 MG) BY MOUTH DAILY   tiZANidine (ZANAFLEX) 4 MG tablet Take 4 mg by mouth at bedtime.   topiramate (TOPAMAX) 50 MG tablet TAKE TWO (2) TABLETS BY MOUTH EVERY DAY AT BEDTIME (Patient taking differently: Take 100 mg by mouth at bedtime. TAKE TWO (2) TABLETS BY MOUTH EVERY DAY AT BEDTIME)   Ubrogepant (UBRELVY) 100 MG TABS Take 1 tablet (100 mg total) by mouth as needed (May repeat after 2 hours.  Maximum 2 tablets in 24 hours). TAKE 1 TABLET BY MOUTH BY MOUTH AS NEEDED( MAY REPEAT 1 TABLET AFTER 2 HOURS IF NEEDED, MAXIMUM 2 TABLETS IN 24 HOURS) Strength: 100 mg   Vitamin D, Ergocalciferol, (DRISDOL) 1.25 MG (50000 UNIT) CAPS  capsule TAKE 1 CAPSULE BY MOUTH EVERY 7 DAYS FOR 12 DOSES (Patient taking differently: Take 50,000 Units by mouth every 7 (seven) days. Patient usually takes this medication on mondays)    Scheduled Meds:  fluticasone furoate-vilanterol  1 puff Inhalation Daily   senna-docusate  2 tablet Oral BID   sodium chloride flush  3 mL Intravenous Q12H   Continuous Infusions:  sodium chloride 75 mL/hr at 11/15/23 0928   cefTRIAXone (ROCEPHIN)  IV Stopped (11/14/23 2253)   PRN Meds:.acetaminophen **OR** acetaminophen, HYDROmorphone (DILAUDID) injection, ipratropium-albuterol, naLOXone (NARCAN)  injection, oxyCODONE, polyethylene glycol  Allergies: No Known Allergies  Family History  Problem Relation Age of Onset   Hypertension Mother    Heart attack Mother    Alcohol abuse Father    Hypertension Father    Heart attack Father    Heart attack Other    Cancer Other    Heart failure Other    Anesthesia problems Neg Hx    Hypotension Neg Hx    Malignant hyperthermia Neg Hx    Pseudochol deficiency Neg Hx    Breast cancer Neg Hx     Social History:  reports that she quit smoking about 3 years ago. Her smoking use included cigarettes. She started smoking about 33 years ago. She has a 15 pack-year smoking history. She has never used smokeless tobacco. She reports current alcohol use of about 2.0 standard drinks of alcohol per week. She reports that she does not use drugs.  ROS: A complete review of systems was performed.  All systems are negative except for pertinent findings as noted.  Physical Exam:  Vital signs in last 24 hours: Temp:  [97.7 F (36.5 C)-98 F (36.7 C)] 97.7 F (36.5 C) (03/22 0700) Pulse Rate:  [72-101] 87 (03/22 0700) Resp:  [14-20] 18 (03/22 0146) BP: (95-140)/(62-93) 119/77 (03/22 0700) SpO2:  [92 %-100 %] 100 % (03/22 0700) Weight:  [67 kg] 67 kg (03/21 1415) Constitutional:  Alert and oriented, No acute distress Cardiovascular: Regular rate and rhythm, No  JVD Respiratory: Normal respiratory effort, Lungs clear bilaterally GI: Abdomen is soft, nontender, nondistended, no abdominal masses GU: Foley catheter in place and draining clear-yellow urine Lymphatic: No lymphadenopathy Neurologic: Grossly intact, no focal deficits Psychiatric: Normal mood and affect  Laboratory Data:  Recent Labs    11/14/23 1649 11/15/23 0320  WBC 12.4* 11.8*  HGB 13.2 11.5*  HCT 41.9 35.7*  PLT 361 331    Recent Labs    11/14/23 1649 11/15/23 0320  NA 135 136  K 4.5 4.1  CL 101 106  GLUCOSE 147* 125*  BUN 83* 79*  CALCIUM 9.5 8.6*  CREATININE 2.79* 2.49*     Results for orders placed or performed during the hospital encounter of 11/14/23 (from the past 24 hours)  Comprehensive metabolic panel     Status: Abnormal   Collection Time: 11/14/23  4:49 PM  Result Value Ref Range   Sodium 135 135 - 145 mmol/L   Potassium 4.5 3.5 - 5.1 mmol/L   Chloride 101 98 - 111 mmol/L   CO2 19 (L) 22 - 32 mmol/L   Glucose, Bld 147 (H) 70 - 99 mg/dL   BUN 83 (H) 8 - 23 mg/dL   Creatinine, Ser 7.84 (H) 0.44 - 1.00 mg/dL   Calcium 9.5 8.9 - 69.6 mg/dL   Total Protein 7.6 6.5 - 8.1 g/dL   Albumin 3.6 3.5 - 5.0 g/dL   AST 24 15 - 41 U/L   ALT 15 0 - 44 U/L   Alkaline Phosphatase 72 38 - 126 U/L   Total Bilirubin 0.5 0.0 - 1.2 mg/dL   GFR, Estimated 18 (L) >60 mL/min   Anion gap 15 5 - 15  CBC with Differential     Status: Abnormal   Collection Time: 11/14/23  4:49 PM  Result Value Ref Range   WBC 12.4 (H) 4.0 - 10.5 K/uL   RBC 4.56 3.87 - 5.11 MIL/uL   Hemoglobin 13.2 12.0 - 15.0 g/dL   HCT 29.5 28.4 - 13.2 %  MCV 91.9 80.0 - 100.0 fL   MCH 28.9 26.0 - 34.0 pg   MCHC 31.5 30.0 - 36.0 g/dL   RDW 40.3 47.4 - 25.9 %   Platelets 361 150 - 400 K/uL   nRBC 0.0 0.0 - 0.2 %   Neutrophils Relative % 86 %   Neutro Abs 10.6 (H) 1.7 - 7.7 K/uL   Lymphocytes Relative 9 %   Lymphs Abs 1.1 0.7 - 4.0 K/uL   Monocytes Relative 3 %   Monocytes Absolute 0.4 0.1 -  1.0 K/uL   Eosinophils Relative 0 %   Eosinophils Absolute 0.0 0.0 - 0.5 K/uL   Basophils Relative 0 %   Basophils Absolute 0.1 0.0 - 0.1 K/uL   Immature Granulocytes 2 %   Abs Immature Granulocytes 0.24 (H) 0.00 - 0.07 K/uL  Urinalysis, Routine w reflex microscopic -Urine, Clean Catch     Status: Abnormal   Collection Time: 11/14/23  8:00 PM  Result Value Ref Range   Color, Urine YELLOW YELLOW   APPearance HAZY (A) CLEAR   Specific Gravity, Urine 1.014 1.005 - 1.030   pH 5.0 5.0 - 8.0   Glucose, UA NEGATIVE NEGATIVE mg/dL   Hgb urine dipstick NEGATIVE NEGATIVE   Bilirubin Urine NEGATIVE NEGATIVE   Ketones, ur 5 (A) NEGATIVE mg/dL   Protein, ur NEGATIVE NEGATIVE mg/dL   Nitrite NEGATIVE NEGATIVE   Leukocytes,Ua TRACE (A) NEGATIVE   RBC / HPF 0-5 0 - 5 RBC/hpf   WBC, UA 11-20 0 - 5 WBC/hpf   Bacteria, UA RARE (A) NONE SEEN   Squamous Epithelial / HPF 0-5 0 - 5 /HPF  APTT     Status: None   Collection Time: 11/15/23  3:20 AM  Result Value Ref Range   aPTT 26 24 - 36 seconds  Protime-INR     Status: None   Collection Time: 11/15/23  3:20 AM  Result Value Ref Range   Prothrombin Time 14.7 11.4 - 15.2 seconds   INR 1.1 0.8 - 1.2  Basic metabolic panel     Status: Abnormal   Collection Time: 11/15/23  3:20 AM  Result Value Ref Range   Sodium 136 135 - 145 mmol/L   Potassium 4.1 3.5 - 5.1 mmol/L   Chloride 106 98 - 111 mmol/L   CO2 21 (L) 22 - 32 mmol/L   Glucose, Bld 125 (H) 70 - 99 mg/dL   BUN 79 (H) 8 - 23 mg/dL   Creatinine, Ser 5.63 (H) 0.44 - 1.00 mg/dL   Calcium 8.6 (L) 8.9 - 10.3 mg/dL   GFR, Estimated 20 (L) >60 mL/min   Anion gap 9 5 - 15  CBC     Status: Abnormal   Collection Time: 11/15/23  3:20 AM  Result Value Ref Range   WBC 11.8 (H) 4.0 - 10.5 K/uL   RBC 3.95 3.87 - 5.11 MIL/uL   Hemoglobin 11.5 (L) 12.0 - 15.0 g/dL   HCT 87.5 (L) 64.3 - 32.9 %   MCV 90.4 80.0 - 100.0 fL   MCH 29.1 26.0 - 34.0 pg   MCHC 32.2 30.0 - 36.0 g/dL   RDW 51.8 84.1 - 66.0 %    Platelets 331 150 - 400 K/uL   nRBC 0.0 0.0 - 0.2 %   No results found for this or any previous visit (from the past 240 hours).  Renal Function: Recent Labs    11/14/23 1649 11/15/23 0320  CREATININE 2.79* 2.49*   Estimated Creatinine Clearance: 18.7  mL/min (A) (by C-G formula based on SCr of 2.49 mg/dL (H)).  Radiologic Imaging: CT THORACIC SPINE WO CONTRAST Result Date: 11/14/2023 CLINICAL DATA:  Mid-back pain, abnormal neuro, positive xray (Ped 0-17y); Low back pain, cauda equina syndrome suspected EXAM: CT THORACIC AND LUMBAR SPINE WITHOUT CONTRAST TECHNIQUE: Multidetector CT imaging of the thoracic and lumbar spine was performed without contrast. Multiplanar CT image reconstructions were also generated. RADIATION DOSE REDUCTION: This exam was performed according to the departmental dose-optimization program which includes automated exposure control, adjustment of the mA and/or kV according to patient size and/or use of iterative reconstruction technique. COMPARISON:  MRI lumbar spine 12/12/2008, CT chest 10/17/2023: CT abdomen pelvis 10/28/2023 FINDINGS: CT THORACIC SPINE FINDINGS Alignment: Normal. Vertebrae: Diffusely decreased bone density. Interval development of an age-indeterminate T1 compression fracture with at least 40% vertebral body height loss. Chronic stable T5, T7 compression fracture. Redemonstration of scattered sclerotic axial skeleton lesions. Multilevel mild degenerative changes of the spine. Paraspinal and other soft tissues: Negative. Disc levels: Maintained. CT LUMBAR SPINE FINDINGS Segmentation: 5 lumbar type vertebrae. Alignment: Normal. Vertebrae: Diffusely decreased bone density. No severe osseous neural foraminal or central canal stenosis. L3-L4, L4-L5, L5-S1 disc bulge with question at least moderate central canal stenosis at the L4-L5 level. No acute fracture or focal pathologic process. Paraspinal and other soft tissues: Negative. Disc levels: Maintained.  Other:. Diffuse bronchial wall thickening. Limited evaluation of the lungs due to motion artifact. Severe right hydroureteronephrosis that is partially visualized. Atherosclerotic plaque. Cardiac leads. Status post cholecystectomy. Acute minimally displaced right posterior first rib fracture (5:21). IMPRESSION: IMPRESSION CT THORACIC SPINE IMPRESSION 1. No acute displaced fracture or traumatic listhesis of the thoracic spine. 2. Redemonstration of scattered sclerotic axial skeleton lesions. Question metastases. CT LUMBAR SPINE IMPRESSION 1. No acute displaced fracture or traumatic listhesis of the lumbar spine. 2. L3-L4, L4-L5, L5-S1 disc bulge with question at least moderate central canal stenosis at the L4-L5 level. Recommend MRI for further evaluation. Other imaging findings of potential clinical significance: 1. Acute minimally displaced right posterior first rib fracture 2. Severe right hydroureteronephrosis partially visualized. Finding better evaluated on CT abdomen pelvis 10/28/2023 3.  Aortic Atherosclerosis (ICD10-I70.0). Electronically Signed   By: Tish Frederickson M.D.   On: 11/14/2023 20:53   CT LUMBAR SPINE WO CONTRAST Result Date: 11/14/2023 CLINICAL DATA:  Mid-back pain, abnormal neuro, positive xray (Ped 0-17y); Low back pain, cauda equina syndrome suspected EXAM: CT THORACIC AND LUMBAR SPINE WITHOUT CONTRAST TECHNIQUE: Multidetector CT imaging of the thoracic and lumbar spine was performed without contrast. Multiplanar CT image reconstructions were also generated. RADIATION DOSE REDUCTION: This exam was performed according to the departmental dose-optimization program which includes automated exposure control, adjustment of the mA and/or kV according to patient size and/or use of iterative reconstruction technique. COMPARISON:  MRI lumbar spine 12/12/2008, CT chest 10/17/2023: CT abdomen pelvis 10/28/2023 FINDINGS: CT THORACIC SPINE FINDINGS Alignment: Normal. Vertebrae: Diffusely decreased bone  density. Interval development of an age-indeterminate T1 compression fracture with at least 40% vertebral body height loss. Chronic stable T5, T7 compression fracture. Redemonstration of scattered sclerotic axial skeleton lesions. Multilevel mild degenerative changes of the spine. Paraspinal and other soft tissues: Negative. Disc levels: Maintained. CT LUMBAR SPINE FINDINGS Segmentation: 5 lumbar type vertebrae. Alignment: Normal. Vertebrae: Diffusely decreased bone density. No severe osseous neural foraminal or central canal stenosis. L3-L4, L4-L5, L5-S1 disc bulge with question at least moderate central canal stenosis at the L4-L5 level. No acute fracture or focal pathologic process. Paraspinal and other  soft tissues: Negative. Disc levels: Maintained. Other:. Diffuse bronchial wall thickening. Limited evaluation of the lungs due to motion artifact. Severe right hydroureteronephrosis that is partially visualized. Atherosclerotic plaque. Cardiac leads. Status post cholecystectomy. Acute minimally displaced right posterior first rib fracture (5:21). IMPRESSION: IMPRESSION CT THORACIC SPINE IMPRESSION 1. No acute displaced fracture or traumatic listhesis of the thoracic spine. 2. Redemonstration of scattered sclerotic axial skeleton lesions. Question metastases. CT LUMBAR SPINE IMPRESSION 1. No acute displaced fracture or traumatic listhesis of the lumbar spine. 2. L3-L4, L4-L5, L5-S1 disc bulge with question at least moderate central canal stenosis at the L4-L5 level. Recommend MRI for further evaluation. Other imaging findings of potential clinical significance: 1. Acute minimally displaced right posterior first rib fracture 2. Severe right hydroureteronephrosis partially visualized. Finding better evaluated on CT abdomen pelvis 10/28/2023 3.  Aortic Atherosclerosis (ICD10-I70.0). Electronically Signed   By: Tish Frederickson M.D.   On: 11/14/2023 20:53    I independently reviewed the above imaging  studies.  Impression/Recommendation 68 year old female with severe right hydronephrosis likely secondary to right distal ureteral obstruction from an enhancing bladder mass and has features concerning for urothelial cell carcinoma with concurrent AKI.  -Her renal function has slightly improved following Foley catheter placement and IV fluid resuscitation.  She continues to have 10 out of 10 low back pain that radiates into her lower extremities, but denies significant right-sided flank pain, hematuria or nausea/vomiting -Continue IV fluid resuscitation.  If her renal function remains impaired, she will likely need a cystoscopy with right ureteral stent placement, possible right ureteroscopy and bladder biopsy.  Risk, benefits and alternatives extensively discussed with the patient.  Tentatively plan to make her n.p.o. at midnight.  Rhoderick Moody, MD [facility] 11/15/2023, 10:39 AM       1 ppd for several years Planning TURBT with Dr. Saddie Benders Low back pain that radiates into LE  No hematuria

## 2023-11-15 NOTE — Progress Notes (Addendum)
 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    Newton Pigg, DO Hospitalist

## 2023-11-15 NOTE — Plan of Care (Signed)
  Problem: Clinical Measurements: Goal: Will remain free from infection Outcome: Progressing   Problem: Activity: Goal: Risk for activity intolerance will decrease Outcome: Progressing   Problem: Pain Managment: Goal: General experience of comfort will improve and/or be controlled Outcome: Progressing   Problem: Safety: Goal: Ability to remain free from injury will improve Outcome: Progressing   Problem: Skin Integrity: Goal: Risk for impaired skin integrity will decrease Outcome: Progressing

## 2023-11-15 NOTE — Progress Notes (Signed)
 I reviewed this patient's imaging and discussed her with Dr Renford Dills.  We will discuss her early Monday AM at [facility] tumor board.   Complex history including bladder cancer and chronic degenerative spine disease.   Unfortunately, patient cannot undergo MRI. CT images were obtained.  Given lack of cord compression, there is not a role for emergent radiation.  Pt will continue steroids through the weekend and plan of care will be clarified at tumor board.  -----------------------------------  Lonie Peak, MD Radiation Oncology

## 2023-11-15 NOTE — Consult Note (Addendum)
 NEUROLOGY CONSULT NOTE   Date of service: November 15, 2023 Patient Name: [name] MRN:  [id number] DOB:  1955-10-10 Chief Complaint: "lower extremity weakness" Requesting Provider: Burnadette Pop, MD  History of Present Illness  [name] is a 68 y.o. 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 She presented to [facility] ED on 3/21 due to flank pain and lower extremity pain. Additionally she was recently discharged on 3/16 with abx for a UTI/bladder infection. She has had chronic back pain for years, was previously on gabapentin and began having jerking movements. She discontinued it at that time but was recently restarted on it for her back pain. She began noticing the jerking movements again the day after taking it. She was prescribed 800mg  TID. Additionally, she takes oxycodone 15 mg every 8 hours as needed (she does take this daily) and Belbuca 900 mcg every 12 hours. Gabapentin has not been given in the hospital.  On initial exam she is falling asleep as she is post CT myelogram, however during exam she does wake up more.  She states that a majority of her weakness is from pain, however she does believe that she also has underlying weakness - she believes that they are both contributing to her BLE weakness.  She noted the above symptoms starting approximately 7 days ago and then worsening over the last couple of days to the point where she is unable to perform ADLs independently.  She has also noticed that the jerking movements have become worse since resuming gabapentin. Of note, labs here show severely reduced renal function, with eGFR of 20.   She had a CT abdomen/pelvis on 10/28/2023 that revealed severe right hydronephrosis with marked right renal atrophy along with a solid mass concerning for urothelial cell carcinoma. She was also noted to have a pelvic bony lesion  concerning for metastatic disease on her most recent CT. Given her renal function she will likely need a cystoscopy with a right ureteral stent placement with the urology team.  They have tentatively made her n.p.o. at midnight for this procedure tomorrow    ROS  Comprehensive ROS performed and pertinent positives documented in HPI   Past History   Past Medical History:  Diagnosis Date   Abdominal pain 10/17/2020   Abnormality of gait due to impairment of balance 11/22/2020   Acute diverticulitis 10/17/2020   Admission for long-term opiate analgesic use 10/24/2019   Arthritis of right hip 05/29/2016   Formatting of this note might be different from the original. Added automatically from request for surgery [id number]   BMI 26.0-26.9,adult 03/29/2020   Bronchial asthma    Cardiomyopathy (HCC)    Overview:  Ejection fraction 45% in 2015 Ejection fraction 30 to 35% in November 2018   Chronic back pain    Chronic constipation 07/31/2021   Chronic hypoxemic respiratory failure (HCC) 10/24/2019   Chronic narcotic use 03/23/2015   Chronic pain of both knees 12/21/2018   Added automatically from request for surgery [id number]  Formatting of this note might be different from the original. 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   Dyslipidemia 05/31/2015   GERD  (gastroesophageal reflux disease)    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HCC) 07/13/2020   Hypotension 05/17/2020   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   Ileus following gastrointestinal surgery (HCC) 03/26/2015   Major depressive disorder, single episode, moderate (HCC) 10/24/2019   Malnutrition of moderate degree (HCC) 10/20/2020   Mesenteric ischemia (HCC) 10/16/2020   Migraine without aura with status migrainosus 10/24/2019   Mixed incontinence 10/20/2020   Myocardial infarction (HCC)    Nausea & vomiting    Obstructive chronic bronchitis with exacerbation (HCC) 10/25/2019   Other spondylosis with radiculopathy, lumbar region 10/24/2019   Persistent vomiting 05/17/2020   Presence of left artificial hip joint 10/24/2019   PTSD (post-traumatic stress disorder)    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 03/23/2015 04/19/2014   Senile osteoporosis 10/24/2019   Sepsis (HCC) 10/17/2020   Serosanguineous chronic otitis media of right ear 08/02/2021   Small bowel obstruction (HCC)    Thyroid disease    Upper respiratory tract infection due to COVID-19 virus 07/06/2021   Wellness examination 03/27/2021    Past Surgical History:  Procedure Laterality Date   APPENDECTOMY     CARDIAC CATHETERIZATION     CARPAL TUNNEL RELEASE     CESAREAN SECTION     CHF s/p AICD   12/2010   CHOLECYSTECTOMY     COLONOSCOPY  10/16/2015   Mild colonic diverticulosis, predominantly in the left colon. Small internal hemrrhoids. Otherwise normal colonoscopy   COLONOSCOPY WITH PROPOFOL N/A 09/27/2021   Procedure: COLONOSCOPY WITH PROPOFOL;  Surgeon: Napoleon Form, MD;  Location: [facility] ENDOSCOPY;  Service: Endoscopy;  Laterality: N/A;   CORONARY ANGIOPLASTY     ESOPHAGOGASTRODUODENOSCOPY  02/04/2014   Mild gastritis. Otherwise normal EGD   HAND SURGERY Bilateral    ICD GENERATOR CHANGEOUT N/A 05/21/2019   Procedure: ICD GENERATOR CHANGEOUT;  Surgeon: Regan Lemming, MD;  Location: [facility] INVASIVE CV LAB;  Service: Cardiovascular;  Laterality: N/A;   ICD IMPLANT     [employer]   intestinal blockage 2011     LAPAROSCOPIC ASSISTED VENTRAL HERNIA REPAIR N/A 03/23/2015   Procedure: LAPAROSCOPIC VENTRAL WALL HERNIA REPAIR;  Surgeon: Karie Soda, MD;  Location: [facility] ORS;  Service: General;  Laterality: N/A;  With MESH   LAPAROSCOPIC LYSIS OF ADHESIONS N/A 03/23/2015   Procedure: LAPAROSCOPIC LYSIS OF ADHESIONS;  Surgeon: Karie Soda, MD;  Location: [facility] ORS;  Service: General;  Laterality: N/A;   LSCS      x2   NASAL SEPTUM SURGERY     NECK SURGERY     fused   TONSILLECTOMY     TYMPANOSTOMY TUBE PLACEMENT Right 2024   ULNAR NERVE TRANSPOSITION  01/23/2012   Procedure: ULNAR NERVE DECOMPRESSION/TRANSPOSITION;  Surgeon: Cristi Loron, MD;  Location: [facility] NEURO ORS;  Service: Neurosurgery;  Laterality: Left;  LEFT ulnar nerve decompression    Family History: Family History  Problem Relation Age of Onset   Hypertension Mother    Heart attack Mother    Alcohol abuse Father    Hypertension Father    Heart attack Father    Heart attack Other    Cancer Other    Heart failure Other  Anesthesia problems Neg Hx    Hypotension Neg Hx    Malignant hyperthermia Neg Hx    Pseudochol deficiency Neg Hx    Breast cancer Neg Hx     Social History  reports that she quit smoking about 3 years ago. Her smoking use included cigarettes. She started smoking about 33 years ago. She has a 15 pack-year smoking history. She has never used smokeless tobacco. She reports current alcohol use of about 2.0 standard drinks of alcohol per week. She reports that she does not use drugs.  No Known Allergies  Medications   Current Facility-Administered Medications:    0.9 %  sodium chloride infusion, , Intravenous, Continuous, Adhikari, Amrit, MD, Last Rate: 75 mL/hr at 11/15/23 0928, New Bag at 11/15/23 4010   acetaminophen (TYLENOL) tablet 650 mg, 650 mg, Oral, Q6H PRN  **OR** acetaminophen (TYLENOL) suppository 650 mg, 650 mg, Rectal, Q6H PRN, Nolberto Hanlon, MD   cefTRIAXone (ROCEPHIN) 1 g in sodium chloride 0.9 % 100 mL IVPB, 1 g, Intravenous, Q24H, Nolberto Hanlon, MD, Stopped at 11/14/23 2253   Chlorhexidine Gluconate Cloth 2 % PADS 6 each, 6 each, Topical, Daily, Adhikari, Amrit, MD   fluticasone furoate-vilanterol (BREO ELLIPTA) 100-25 MCG/ACT 1 puff, 1 puff, Inhalation, Daily, Nolberto Hanlon, MD   HYDROmorphone (DILAUDID) injection 0.5 mg, 0.5 mg, Intravenous, Q2H PRN, Howerter, Justin B, DO, 0.5 mg at 11/15/23 1131   ipratropium-albuterol (DUONEB) 0.5-2.5 (3) MG/3ML nebulizer solution 3 mL, 3 mL, Nebulization, Q4H PRN, Nolberto Hanlon, MD   naloxone [facility]) injection 0.4 mg, 0.4 mg, Intravenous, PRN, Howerter, Justin B, DO   oxyCODONE (Oxy IR/ROXICODONE) immediate release tablet 5 mg, 5 mg, Oral, Q4H PRN, Nolberto Hanlon, MD, 5 mg at 11/14/23 2232   polyethylene glycol (MIRALAX / GLYCOLAX) packet 17 g, 17 g, Oral, Daily PRN, Nolberto Hanlon, MD, 17 g at 11/15/23 0236   senna-docusate (Senokot-S) tablet 2 tablet, 2 tablet, Oral, BID, Nolberto Hanlon, MD, 2 tablet at 11/15/23 2725   sodium chloride 0.9 % bolus 250 mL, 250 mL, Intravenous, Once, Loyce Dys Sue-Ellen, PA   sodium chloride flush (NS) 0.9 % injection 3 mL, 3 mL, Intravenous, Q12H, Nolberto Hanlon, MD, 3 mL at 11/15/23 0929  Vitals   Vitals:   11/15/23 0015 11/15/23 0146 11/15/23 0700 11/15/23 1100  BP:  109/72 119/77 114/66  Pulse:  81 87 82  Resp:  18    Temp: 97.7 F (36.5 C) 97.7 F (36.5 C) 97.7 F (36.5 C) 98.1 F (36.7 C)  TempSrc: Oral  Oral Oral  SpO2:  100% 100%   Weight:      Height:        Body mass index is 27.91 kg/m.  Physical Exam   Constitutional: Appears well-developed and well-nourished.  Psych: Affect appropriate to situation.  Eyes: No scleral injection.  HENT: No OP obstruction.  Head: Normocephalic.  Cardiovascular: Normal rate and regular rhythm.  Respiratory: Effort  normal, non-labored breathing.  GI: Soft.  No distension. There is no tenderness.  Skin: WDI.   Neurologic Examination   Mental Status: Patient is drowsy, perseverates on some words, but oriented to person, place, month, year, and situation. Patient is able to give a clear and coherent history. No signs of aphasia or neglect Cranial Nerves: II: Visual Fields are full. Pupils are equal, round, and reactive to light.   III,IV, VI: EOMI without ptosis or diplopia.  V: Facial sensation is symmetric to temperature VII: Facial movement is symmetric resting and smiling VIII: Hearing  is intact to voice X: Palate elevates symmetrically XI: Shoulder shrug is symmetric. XII: Tongue protrudes midline without atrophy or fasciculations.  Motor: Poor effort on strength exam, pain with motion in lower extremities  Tone is normal. Bulk is normal. Bilateral upper extremities with drift. Left upper and lower extremity slightly weaker than right  RUE 4/5  LUE 4-/5 RLE 2+/5  LLE 2/5 Pain with effort of plantar and dorsiflexion 2/5 Back pain increases with SLR bilaterally Sensory: Sensation is symmetric to light touch and temperature in the arms and legs.  Deep Tendon Reflexes: 3+ and symmetric in the biceps, 1+  in the triceps 2+ and symmetric in the patellae 1+ in the ankles Cerebellar: Asterixis noted in bilateral upper and lower extremities, proximally and distally (severe asterixis)    Labs/Imaging/Neurodiagnostic studies   CBC:  Recent Labs  Lab 12/08/23 1649 11/15/23 0320  WBC 12.4* 11.8*  NEUTROABS 10.6*  --   HGB 13.2 11.5*  HCT 41.9 35.7*  MCV 91.9 90.4  PLT 361 331   Basic Metabolic Panel:  Lab Results  Component Value Date   NA 136 11/15/2023   K 4.1 11/15/2023   CO2 21 (L) 11/15/2023   GLUCOSE 125 (H) 11/15/2023   BUN 79 (H) 11/15/2023   CREATININE 2.49 (H) 11/15/2023   CALCIUM 8.6 (L) 11/15/2023   GFRNONAA 20 (L) 11/15/2023   GFRAA 57 (L) 09/05/2020   Lipid  Panel:  Lab Results  Component Value Date   LDLCALC 124 (H) 09/25/2023   HgbA1c:  Lab Results  Component Value Date   HGBA1C 5.5 06/23/2023   INR  Lab Results  Component Value Date   INR 1.1 11/15/2023   APTT  Lab Results  Component Value Date   APTT 26 11/15/2023   CT THORACIC SPINE IMPRESSION 1. No acute displaced fracture or traumatic listhesis of the thoracic spine. 2. Redemonstration of scattered sclerotic axial skeleton lesions. Question metastases.  CT LUMBAR SPINE IMPRESSION 1. No acute displaced fracture or traumatic listhesis of the lumbar spine . 2. L3-L4, L4-L5, L5-S1 disc bulge with question at least moderate central canal stenosis at the L4-L5 level.   1. Acute minimally displaced right posterior first rib fracture 2. Severe right hydroureteronephrosis partially visualized. Finding better evaluated on CT abdomen pelvis 10/28/2023 3. Aortic Atherosclerosis (ICD10-I70.0).   CT CAP from 3/4 Localized wall thickening of the posterior right urinary bladder, concerning for primary bladder neoplasm. Obstructing mass near the right UVJ and hydronephrosis, and concern for bony metastasis of the pubic rami or suspicious lesion.   ASSESSMENT  [name] is a 68 y.o. female with PMH of COPD, chronic back pain followed by a pain clinic, recent bladder cancer diagnosis who presented to the emergency department with pain radiating from her mid lower back down her legs. Neurology consulted for tremors and weakness of her BLE. - Imaging studies are summarized above.  - Exam with weakness and asterixis in all 4 extremities, most likely secondary to gabapentin toxicity given her high-dosage regimen in conjunction with severely reduced eGFR. She additionally has significant back pain on SLR. Will need to monitor for improvement over the next couple of days while off gabapentin, which should not be resumed outpatient.   RECOMMENDATIONS  - Continue off gabapentin and do not  resume outpatient. Will need an alternative regimen for pain relief. - Avoid nephrotoxic medications - Neurohospitalist service will follow PRN. Please call if there are additional questions.  ______________________________________________________________________    Crystal Reeves, NP  [facility]  I have seen and examined the patient. I have formulated the assessment and recommendations.  68 y.o. female with PMH of COPD, chronic back pain followed by a pain clinic, recent bladder cancer diagnosis who presented to the emergency department with pain radiating from her mid lower back down her legs. Neurology consulted for tremors and weakness of her BLE. Imaging studies are summarized above. Exam with weakness and asterixis in all 4 extremities, most likely secondary to gabapentin toxicity given her high-dosage regimen in conjunction with severely reduced eGFR. She additionally has significant back pain on SLR. Will need to monitor for improvement over the next couple of days while off gabapentin, which should not be resumed outpatient.  Electronically signed: Dr. Caryl Pina

## 2023-11-15 NOTE — Plan of Care (Signed)

## 2023-11-15 NOTE — Consult Note (Signed)
 Reason for Consult:weakness lower extremities, low back pain Referring Physician: [name], [name] is an 68 y.o. female.  HPI: who reports that for over 10 days has not been able to walk. Her husband states over a week ago that he was helping her stand and get to the bathroom. She has been incontinent of urine soiling herself multiple times. She does not state loss of sensation at any time. [name] is also newly diagnosed with bladder CA to add confounding information. She has a very long history of low back pain, but it never previously included weakness. A plain CT was performed suggesting a large disc herniation at the L4/5 level. [name] is unable to have an MRI due to a pacemaker.   Past Medical History:  Diagnosis Date   Abdominal pain 10/17/2020   Abnormality of gait due to impairment of balance 11/22/2020   Acute diverticulitis 10/17/2020   Admission for long-term opiate analgesic use 10/24/2019   Arthritis of right hip 05/29/2016   Formatting of this note might be different from the original. Added automatically from request for surgery [id number]   BMI 26.0-26.9,adult 03/29/2020   Bronchial asthma    Cardiomyopathy (HCC)    Overview:  Ejection fraction 45% in 2015 Ejection fraction 30 to 35% in November 2018   Chronic back pain    Chronic constipation 07/31/2021   Chronic hypoxemic respiratory failure (HCC) 10/24/2019   Chronic narcotic use 03/23/2015   Chronic pain of both knees 12/21/2018   Added automatically from request for surgery [id number]  Formatting of this note might be different from the original. 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   Hypotension 05/17/2020   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   Ileus following gastrointestinal surgery (HCC) 03/26/2015   Major depressive disorder, single episode, moderate (HCC) 10/24/2019   Malnutrition of moderate degree (HCC) 10/20/2020   Mesenteric ischemia (HCC) 10/16/2020   Migraine without aura with status migrainosus 10/24/2019   Mixed incontinence 10/20/2020   Myocardial infarction (HCC)    Nausea & vomiting    Obstructive chronic bronchitis with exacerbation (HCC) 10/25/2019   Other spondylosis with radiculopathy, lumbar region 10/24/2019   Persistent vomiting 05/17/2020   Presence of left artificial hip joint 10/24/2019   PTSD (post-traumatic stress disorder)    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 03/23/2015 04/19/2014   Senile osteoporosis 10/24/2019   Sepsis (HCC) 10/17/2020   Serosanguineous chronic otitis media of right ear 08/02/2021   Small bowel obstruction (HCC)    Thyroid disease    Upper respiratory tract infection due to COVID-19 virus 07/06/2021   Wellness examination 03/27/2021    Past Surgical History:  Procedure Laterality Date   APPENDECTOMY     CARDIAC CATHETERIZATION     CARPAL TUNNEL RELEASE  CESAREAN SECTION     CHF s/p AICD   12/2010   CHOLECYSTECTOMY     COLONOSCOPY  10/16/2015   Mild colonic diverticulosis, predominantly in the left colon. Small internal hemrrhoids. Otherwise normal colonoscopy   COLONOSCOPY WITH PROPOFOL N/A 09/27/2021   Procedure:  COLONOSCOPY WITH PROPOFOL;  Surgeon: Napoleon Form, MD;  Location: [facility];  Service: Endoscopy;  Laterality: N/A;   CORONARY ANGIOPLASTY     ESOPHAGOGASTRODUODENOSCOPY  02/04/2014   Mild gastritis. Otherwise normal EGD   HAND SURGERY Bilateral    ICD GENERATOR CHANGEOUT N/A 05/21/2019   Procedure: ICD GENERATOR CHANGEOUT;  Surgeon: Regan Lemming, MD;  Location: [facility]IVE CV [facility];  Service: Cardiovascular;  Laterality: N/A;   ICD IMPLANT     [employer]   intestinal blockage 2011     LAPAROSCOPIC ASSISTED VENTRAL HERNIA REPAIR N/A 03/23/2015   Procedure: LAPAROSCOPIC VENTRAL WALL HERNIA REPAIR;  Surgeon: Karie Soda, MD;  Location: [facility];  Service: General;  Laterality: N/A;  With MESH   LAPAROSCOPIC LYSIS OF ADHESIONS N/A 03/23/2015   Procedure: LAPAROSCOPIC LYSIS OF ADHESIONS;  Surgeon: Karie Soda, MD;  Location: [facility];  Service: General;  Laterality: N/A;   LSCS      x2   NASAL SEPTUM SURGERY     NECK SURGERY     fused   TONSILLECTOMY     TYMPANOSTOMY TUBE PLACEMENT Right 2024   ULNAR NERVE TRANSPOSITION  01/23/2012   Procedure: ULNAR NERVE DECOMPRESSION/TRANSPOSITION;  Surgeon: Cristi Loron, MD;  Location: [facility]UR[facility];  Service: Neurosurgery;  Laterality: Left;  LEFT ulnar nerve decompression    Family History  Problem Relation Age of Onset   Hypertension Mother    Heart attack Mother    Alcohol abuse Father    Hypertension Father    Heart attack Father    Heart attack Other    Cancer Other    Heart failure Other    Anesthesia problems Neg Hx    Hypotension Neg Hx    Malignant hyperthermia Neg Hx    Pseudochol deficiency Neg Hx    Breast cancer Neg Hx     Social History:  reports that she quit smoking about 3 years ago. Her smoking use included cigarettes. She started smoking about 33 years ago. She has a 15 pack-year smoking history. She has never used smokeless tobacco. She reports current alcohol use of about 2.0 standard drinks of alcohol  per week. She reports that she does not use drugs.  Allergies: No Known Allergies  Medications: I have reviewed the patient's current medications.  Results for orders placed or performed during the hospital encounter of 11/14/23 (from the past 48 hours)  Comprehensive metabolic panel     Status: Abnormal   Collection Time: 11/14/23  4:49 PM  Result Value Ref Range   Sodium 135 135 - 145 mmol/L   Potassium 4.5 3.5 - 5.1 mmol/L   Chloride 101 98 - 111 mmol/L   CO2 19 (L) 22 - 32 mmol/L   Glucose, Bld 147 (H) 70 - 99 mg/dL    Comment: Glucose reference range applies only to samples taken after fasting for at least 8 hours.   BUN 83 (H) 8 - 23 mg/dL   Creatinine, Ser 8.65 (H) 0.44 - 1.00 mg/dL   Calcium 9.5 8.9 - 78.4 mg/dL   Total Protein 7.6 6.5 - 8.1 g/dL   Albumin 3.6 3.5 - 5.0 g/dL   AST 24 15 - 41 U/L  ALT 15 0 - 44 U/L   Alkaline Phosphatase 72 38 - 126 U/L   Total Bilirubin 0.5 0.0 - 1.2 mg/dL   GFR, Estimated 18 (L) >60 mL/min    Comment: (NOTE) Calculated using the CKD-EPI Creatinine Equation (2021)    Anion gap 15 5 - 15    Comment: Performed at [facility], [address], [place], [place] [address]  CBC with Differential     Status: Abnormal   Collection Time: 11/14/23  4:49 PM  Result Value Ref Range   WBC 12.4 (H) 4.0 - 10.5 K/uL   RBC 4.56 3.87 - 5.11 MIL/uL   Hemoglobin 13.2 12.0 - 15.0 g/dL   HCT 08.6 57.8 - 46.9 %   MCV 91.9 80.0 - 100.0 fL   MCH 28.9 26.0 - 34.0 pg   MCHC 31.5 30.0 - 36.0 g/dL   RDW 62.9 52.8 - 41.3 %   Platelets 361 150 - 400 K/uL   nRBC 0.0 0.0 - 0.2 %   Neutrophils Relative % 86 %   Neutro Abs 10.6 (H) 1.7 - 7.7 K/uL   Lymphocytes Relative 9 %   Lymphs Abs 1.1 0.7 - 4.0 K/uL   Monocytes Relative 3 %   Monocytes Absolute 0.4 0.1 - 1.0 K/uL   Eosinophils Relative 0 %   Eosinophils Absolute 0.0 0.0 - 0.5 K/uL   Basophils Relative 0 %   Basophils Absolute 0.1 0.0 - 0.1 K/uL   Immature Granulocytes 2 %   Abs  Immature Granulocytes 0.24 (H) 0.00 - 0.07 K/uL    Comment: Performed at [facility], [address] [place], [place] [address]  Urinalysis, Routine w reflex microscopic -Urine, Clean Catch     Status: Abnormal   Collection Time: 11/14/23  8:00 PM  Result Value Ref Range   Color, Urine YELLOW YELLOW   APPearance HAZY (A) CLEAR   Specific Gravity, Urine 1.014 1.005 - 1.030   pH 5.0 5.0 - 8.0   Glucose, UA NEGATIVE NEGATIVE mg/dL   Hgb urine dipstick NEGATIVE NEGATIVE   Bilirubin Urine NEGATIVE NEGATIVE   Ketones, ur 5 (A) NEGATIVE mg/dL   Protein, ur NEGATIVE NEGATIVE mg/dL   Nitrite NEGATIVE NEGATIVE   Leukocytes,Ua TRACE (A) NEGATIVE   RBC / HPF 0-5 0 - 5 RBC/hpf   WBC, UA 11-20 0 - 5 WBC/hpf   Bacteria, UA RARE (A) NONE SEEN   Squamous Epithelial / HPF 0-5 0 - 5 /HPF    Comment: Performed at [facility], [address], [place], [place] [address]  HIV Antibody (routine testing w rflx)     Status: None   Collection Time: 11/15/23  3:20 AM  Result Value Ref Range   HIV Screen 4th Generation wRfx Non Reactive Non Reactive    Comment: Performed at [facility], [address], [place], [place] [address]  APTT     Status: None   Collection Time: 11/15/23  3:20 AM  Result Value Ref Range   aPTT 26 24 - 36 seconds    Comment: Performed at [facility], [address], [place], [place] [address]  Protime-INR     Status: None   Collection Time: 11/15/23  3:20 AM  Result Value Ref Range   Prothrombin Time 14.7 11.4 - 15.2 seconds   INR 1.1 0.8 - 1.2    Comment: (NOTE) INR goal varies based on device and disease states. Performed at [facility],  [address], [place], [place] [address]   Basic metabolic panel     Status: Abnormal   Collection Time: 11/15/23  3:20 AM  Result Value Ref Range   Sodium 136 135 - 145 mmol/L   Potassium 4.1 3.5 - 5.1 mmol/L   Chloride 106 98 - 111 mmol/L   CO2 21 (L) 22 - 32 mmol/L    Glucose, Bld 125 (H) 70 - 99 mg/dL    Comment: Glucose reference range applies only to samples taken after fasting for at least 8 hours.   BUN 79 (H) 8 - 23 mg/dL   Creatinine, Ser 7.42 (H) 0.44 - 1.00 mg/dL   Calcium 8.6 (L) 8.9 - 10.3 mg/dL   GFR, Estimated 20 (L) >60 mL/min    Comment: (NOTE) Calculated using the CKD-EPI Creatinine Equation (2021)    Anion gap 9 5 - 15    Comment: Performed at [facility], [address], [place], [place] [address]  CBC     Status: Abnormal   Collection Time: 11/15/23  3:20 AM  Result Value Ref Range   WBC 11.8 (H) 4.0 - 10.5 K/uL   RBC 3.95 3.87 - 5.11 MIL/uL   Hemoglobin 11.5 (L) 12.0 - 15.0 g/dL   HCT 87.5 (L) 64.3 - 32.9 %   MCV 90.4 80.0 - 100.0 fL   MCH 29.1 26.0 - 34.0 pg   MCHC 32.2 30.0 - 36.0 g/dL   RDW 51.8 84.1 - 66.0 %   Platelets 331 150 - 400 K/uL   nRBC 0.0 0.0 - 0.2 %    Comment: Performed at [facility], [address], [place], [place] [address]    CT THORACIC SPINE WO CONTRAST Result Date: 11/15/2023 CLINICAL DATA:  Back pain. Lower extremity weakness. Question cauda acquired syndrome. EXAM: CT MYELOGRAPHY LUMBAR SPINE TECHNIQUE: CT imaging of the thoracic and lumbar spine was performed after Isovue 300M contrast administration. Multiplanar CT image reconstructions were also generated. RADIATION DOSE REDUCTION: This exam was performed according to the departmental dose-optimization program which includes automated exposure control, adjustment of the mA and/or kV according to patient size and/or use of iterative reconstruction technique. COMPARISON:  CT of the thoracic and lumbar spine 11/14/2023. FINDINGS: Thoracic spine Alignment: No significant listhesis is present. Mild rightward curvature is centered at T6. Mild straightening of the normal thoracic kyphosis is stable. Vertebrae: The superior endplate compression fracture at L1 is more severe on the left. Slight retropulsed bone is present without focal stenosis.  The superior endplate fracture at T5 demonstrates 30-40% loss of height. No retropulsed bone is present. This may be subacute. Superior endplate fracture at T7 is more acute, with 40-50% loss of height. Slight retropulsed bone is present without significant stenosis. Progressive collapse is present at this level. Sclerotic lesions are present on the left at T2 and T3 extending into the left pedicle at T3. Conus medullaris: Extends to the  level and appears normal. Paraspinal and other soft tissues: The paraspinous soft tissues are within normal limits. The visualized lung fields are clear. Pacing wires are in place. Severe right-sided hydronephrosis is again noted. Disc levels: No significant central canal stenosis or cord compression is present. Mild right foraminal narrowing present at T8-9, T9-10 and T10-11. Mild left foraminal narrowing present at T5-6 and T6-7. Lumbar spine Segmentation: 5 non rib-bearing lumbar type vertebral bodies are present. The lowest fully formed vertebral body is L5. Alignment: No significant listhesis is present. Levoconvex curvature is centered at  L1-2. Mild straightening of the normal lumbar lordosis is present. Vertebrae: Vertebral body heights are normal. No acute or healing fractures are present. Conus medullaris: Extends to the T12-L1   Level and appears normal. Paraspinal and other soft tissues: Atherosclerotic calcifications are present the aorta and branch vessels. No adenopathy is present. Severe right-sided hydronephrosis is again noted. Disc levels: L1-2: Normal disc signal and height is present. No focal protrusion or stenosis is present. L2-3: Normal disc signal and height is present. No focal protrusion or stenosis is present. L3-4: A mild broad-based disc bulge is present. Mild facet hypertrophy is noted bilaterally. Disc material extends into the foramina bilaterally. Mild right foraminal narrowing is present. L4-5: A leftward disc protrusion is present. Moderate left  subarticular and foraminal narrowing is present. Mild right foraminal narrowing is present. L5-S1: Chronic loss of disc height is present. A leftward disc protrusion likely contacts the traversing left S1 nerve roots. Facet spurring contributes to mild foraminal narrowing bilaterally, left greater than right. IMPRESSION: 1. No significant cord compression or central canal stenosis to explain the patient's symptoms. 2. Acute superior endplate compression fracture at T7 with 40-50% loss of height. Slight retropulsed bone is present without significant stenosis. 3. Superior endplate compression fracture at T5 with 30-40% loss of height. No retropulsed bone is present. This may be subacute. 4. Superior endplate compression fracture at L1 is more severe on the left. Slight retropulsed bone is present without focal stenosis. 5. Sclerotic lesions on the left at T2 and T3 extending into the left pedicle at T3. These are concerning for metastatic disease. 6. Mild right foraminal narrowing at T8-9, T9-10 and T10-11. 7. Mild left foraminal narrowing at T5-6 and T6-7. 8. Moderate left subarticular and foraminal narrowing at L4-5. 9. Mild foraminal narrowing bilaterally at L5-S1, left greater than right. 10. Severe right-sided hydronephrosis is again noted. 11.  Aortic Atherosclerosis (ICD10-I70.0). These results were called by telephone at the time of interpretation on 11/15/2023 at 1:50 pm to Dr. Coletta Memos, Who verbally acknowledged these results. Electronically Signed   By: Marin Roberts M.D.   On: 11/15/2023 15:31   CT LUMBAR SPINE WO CONTRAST Result Date: 11/15/2023 CLINICAL DATA:  Back pain. Lower extremity weakness. Question cauda acquired syndrome. EXAM: CT MYELOGRAPHY LUMBAR SPINE TECHNIQUE: CT imaging of the thoracic and lumbar spine was performed after Isovue 300M contrast administration. Multiplanar CT image reconstructions were also generated. RADIATION DOSE REDUCTION: This exam was performed according  to the departmental dose-optimization program which includes automated exposure control, adjustment of the mA and/or kV according to patient size and/or use of iterative reconstruction technique. COMPARISON:  CT of the thoracic and lumbar spine 11/14/2023. FINDINGS: Thoracic spine Alignment: No significant listhesis is present. Mild rightward curvature is centered at T6. Mild straightening of the normal thoracic kyphosis is stable. Vertebrae: The superior endplate compression fracture at L1 is more severe on the left. Slight retropulsed bone is present without focal stenosis. The superior endplate fracture at T5 demonstrates 30-40% loss of height. No retropulsed bone is present. This may be subacute. Superior endplate fracture at T7 is more acute, with 40-50% loss of height. Slight retropulsed bone is present without significant stenosis. Progressive collapse is present at this level. Sclerotic lesions are present on the left at T2 and T3 extending into the left pedicle at T3. Conus medullaris: Extends to the  level and appears normal. Paraspinal and other soft tissues: The paraspinous soft tissues are within normal limits. The visualized lung fields are clear.  Pacing wires are in place. Severe right-sided hydronephrosis is again noted. Disc levels: No significant central canal stenosis or cord compression is present. Mild right foraminal narrowing present at T8-9, T9-10 and T10-11. Mild left foraminal narrowing present at T5-6 and T6-7. Lumbar spine Segmentation: 5 non rib-bearing lumbar type vertebral bodies are present. The lowest fully formed vertebral body is L5. Alignment: No significant listhesis is present. Levoconvex curvature is centered at L1-2. Mild straightening of the normal lumbar lordosis is present. Vertebrae: Vertebral body heights are normal. No acute or healing fractures are present. Conus medullaris: Extends to the T12-L1   Level and appears normal. Paraspinal and other soft tissues:  Atherosclerotic calcifications are present the aorta and branch vessels. No adenopathy is present. Severe right-sided hydronephrosis is again noted. Disc levels: L1-2: Normal disc signal and height is present. No focal protrusion or stenosis is present. L2-3: Normal disc signal and height is present. No focal protrusion or stenosis is present. L3-4: A mild broad-based disc bulge is present. Mild facet hypertrophy is noted bilaterally. Disc material extends into the foramina bilaterally. Mild right foraminal narrowing is present. L4-5: A leftward disc protrusion is present. Moderate left subarticular and foraminal narrowing is present. Mild right foraminal narrowing is present. L5-S1: Chronic loss of disc height is present. A leftward disc protrusion likely contacts the traversing left S1 nerve roots. Facet spurring contributes to mild foraminal narrowing bilaterally, left greater than right. IMPRESSION: 1. No significant cord compression or central canal stenosis to explain the patient's symptoms. 2. Acute superior endplate compression fracture at T7 with 40-50% loss of height. Slight retropulsed bone is present without significant stenosis. 3. Superior endplate compression fracture at T5 with 30-40% loss of height. No retropulsed bone is present. This may be subacute. 4. Superior endplate compression fracture at L1 is more severe on the left. Slight retropulsed bone is present without focal stenosis. 5. Sclerotic lesions on the left at T2 and T3 extending into the left pedicle at T3. These are concerning for metastatic disease. 6. Mild right foraminal narrowing at T8-9, T9-10 and T10-11. 7. Mild left foraminal narrowing at T5-6 and T6-7. 8. Moderate left subarticular and foraminal narrowing at L4-5. 9. Mild foraminal narrowing bilaterally at L5-S1, left greater than right. 10. Severe right-sided hydronephrosis is again noted. 11.  Aortic Atherosclerosis (ICD10-I70.0). These results were called by telephone at the  time of interpretation on 11/15/2023 at 1:50 pm to Dr. Coletta Memos, Who verbally acknowledged these results. Electronically Signed   By: Marin Roberts M.D.   On: 11/15/2023 15:31   DG MYELOGRAPHY LUMBAR INJ MULTI REGION Result Date: 11/15/2023 CLINICAL DATA:  68 year old female with history of back pain, lower extremity weakness. Contrast injection for CT myelogram requested. EXAM: LUMBAR MYELOGRAM FLUOROSCOPY: dictate in minutes and seconds PROCEDURE: After thorough discussion of risks and benefits of the procedure including bleeding, infection, injury to nerves, blood vessels, adjacent structures as well as headache and CSF leak, written and oral informed consent was obtained. Time out form was completed. Patient was positioned prone on the fluoroscopy table. Local anesthesia was provided with 1% lidocaine without epinephrine after prepped and draped in the usual sterile fashion. Puncture was performed at L2-L3 using a 3 1/2 inch 20-gauge spinal needle via left approach. Using a single pass through the dura, the needle was placed within the thecal sac, with return of clear CSF. 15 mL of Omnipaque 180 was injected into the thecal sac, with normal opacification of the nerve roots and cauda  equina consistent with free flow within the subarachnoid space. FINDINGS: Contiguous axial images were obtained through the Lumbar spine after the intrathecal infusion with appropriate contrast dispersal. IMPRESSION: Successful intrathecal administration of contrast for CT myelogram. Performed by: Loyce Dys PA-C Supervised by Dr. Chryl Heck, MD Electronically Signed   By: Malachy Moan M.D.   On: 11/15/2023 14:34   CT THORACIC SPINE WO CONTRAST Result Date: 11/14/2023 CLINICAL DATA:  Mid-back pain, abnormal neuro, positive xray (Ped 0-17y); Low back pain, cauda equina syndrome suspected EXAM: CT THORACIC AND LUMBAR SPINE WITHOUT CONTRAST TECHNIQUE: Multidetector CT imaging of the thoracic and lumbar  spine was performed without contrast. Multiplanar CT image reconstructions were also generated. RADIATION DOSE REDUCTION: This exam was performed according to the departmental dose-optimization program which includes automated exposure control, adjustment of the mA and/or kV according to patient size and/or use of iterative reconstruction technique. COMPARISON:  MRI lumbar spine 12/12/2008, CT chest 10/17/2023: CT abdomen pelvis 10/28/2023 FINDINGS: CT THORACIC SPINE FINDINGS Alignment: Normal. Vertebrae: Diffusely decreased bone density. Interval development of an age-indeterminate T1 compression fracture with at least 40% vertebral body height loss. Chronic stable T5, T7 compression fracture. Redemonstration of scattered sclerotic axial skeleton lesions. Multilevel mild degenerative changes of the spine. Paraspinal and other soft tissues: Negative. Disc levels: Maintained. CT LUMBAR SPINE FINDINGS Segmentation: 5 lumbar type vertebrae. Alignment: Normal. Vertebrae: Diffusely decreased bone density. No severe osseous neural foraminal or central canal stenosis. L3-L4, L4-L5, L5-S1 disc bulge with question at least moderate central canal stenosis at the L4-L5 level. No acute fracture or focal pathologic process. Paraspinal and other soft tissues: Negative. Disc levels: Maintained. Other:. Diffuse bronchial wall thickening. Limited evaluation of the lungs due to motion artifact. Severe right hydroureteronephrosis that is partially visualized. Atherosclerotic plaque. Cardiac leads. Status post cholecystectomy. Acute minimally displaced right posterior first rib fracture (5:21). IMPRESSION: IMPRESSION CT THORACIC SPINE IMPRESSION 1. No acute displaced fracture or traumatic listhesis of the thoracic spine. 2. Redemonstration of scattered sclerotic axial skeleton lesions. Question metastases. CT LUMBAR SPINE IMPRESSION 1. No acute displaced fracture or traumatic listhesis of the lumbar spine. 2. L3-L4, L4-L5, L5-S1 disc  bulge with question at least moderate central canal stenosis at the L4-L5 level. Recommend MRI for further evaluation. Other imaging findings of potential clinical significance: 1. Acute minimally displaced right posterior first rib fracture 2. Severe right hydroureteronephrosis partially visualized. Finding better evaluated on CT abdomen pelvis 10/28/2023 3.  Aortic Atherosclerosis (ICD10-I70.0). Electronically Signed   By: Tish Frederickson M.D.   On: 11/14/2023 20:53   CT LUMBAR SPINE WO CONTRAST Result Date: 11/14/2023 CLINICAL DATA:  Mid-back pain, abnormal neuro, positive xray (Ped 0-17y); Low back pain, cauda equina syndrome suspected EXAM: CT THORACIC AND LUMBAR SPINE WITHOUT CONTRAST TECHNIQUE: Multidetector CT imaging of the thoracic and lumbar spine was performed without contrast. Multiplanar CT image reconstructions were also generated. RADIATION DOSE REDUCTION: This exam was performed according to the departmental dose-optimization program which includes automated exposure control, adjustment of the mA and/or kV according to patient size and/or use of iterative reconstruction technique. COMPARISON:  MRI lumbar spine 12/12/2008, CT chest 10/17/2023: CT abdomen pelvis 10/28/2023 FINDINGS: CT THORACIC SPINE FINDINGS Alignment: Normal. Vertebrae: Diffusely decreased bone density. Interval development of an age-indeterminate T1 compression fracture with at least 40% vertebral body height loss. Chronic stable T5, T7 compression fracture. Redemonstration of scattered sclerotic axial skeleton lesions. Multilevel mild degenerative changes of the spine. Paraspinal and other soft tissues: Negative. Disc levels: Maintained. CT LUMBAR SPINE FINDINGS  Segmentation: 5 lumbar type vertebrae. Alignment: Normal. Vertebrae: Diffusely decreased bone density. No severe osseous neural foraminal or central canal stenosis. L3-L4, L4-L5, L5-S1 disc bulge with question at least moderate central canal stenosis at the L4-L5 level.  No acute fracture or focal pathologic process. Paraspinal and other soft tissues: Negative. Disc levels: Maintained. Other:. Diffuse bronchial wall thickening. Limited evaluation of the lungs due to motion artifact. Severe right hydroureteronephrosis that is partially visualized. Atherosclerotic plaque. Cardiac leads. Status post cholecystectomy. Acute minimally displaced right posterior first rib fracture (5:21). IMPRESSION: IMPRESSION CT THORACIC SPINE IMPRESSION 1. No acute displaced fracture or traumatic listhesis of the thoracic spine. 2. Redemonstration of scattered sclerotic axial skeleton lesions. Question metastases. CT LUMBAR SPINE IMPRESSION 1. No acute displaced fracture or traumatic listhesis of the lumbar spine. 2. L3-L4, L4-L5, L5-S1 disc bulge with question at least moderate central canal stenosis at the L4-L5 level. Recommend MRI for further evaluation. Other imaging findings of potential clinical significance: 1. Acute minimally displaced right posterior first rib fracture 2. Severe right hydroureteronephrosis partially visualized. Finding better evaluated on CT abdomen pelvis 10/28/2023 3.  Aortic Atherosclerosis (ICD10-I70.0). Electronically Signed   By: Tish Frederickson M.D.   On: 11/14/2023 20:53    Review of Systems  Eyes: Negative.   Respiratory: Negative.    Cardiovascular: Negative.   Gastrointestinal: Negative.   Genitourinary:  Positive for difficulty urinating.  Musculoskeletal:  Positive for back pain.  Skin: Negative.   Allergic/Immunologic: Negative.   Neurological:  Positive for weakness.  Hematological: Negative.   Psychiatric/Behavioral: Negative.     Blood pressure 125/61, pulse 91, temperature 98 F (36.7 C), temperature source Oral, resp. rate 18, height 5\' 1"  (1.549 m), weight 67 kg, SpO2 98%. Physical Exam Constitutional:      Appearance: Normal appearance.  HENT:     Head: Normocephalic and atraumatic.     Right Ear: External ear normal.     Left Ear:  External ear normal.     Nose: Nose normal. No congestion.     Mouth/Throat:     Mouth: Mucous membranes are moist.     Pharynx: Oropharynx is clear.  Eyes:     Extraocular Movements: Extraocular movements intact.     Pupils: Pupils are equal, round, and reactive to light.  Pulmonary:     Effort: Pulmonary effort is normal.  Abdominal:     General: Abdomen is flat.  Musculoskeletal:        General: Normal range of motion.     Cervical back: Normal range of motion.  Skin:    General: Skin is warm and dry.  Neurological:     Mental Status: She is oriented to person, place, and time. She is lethargic.     Cranial Nerves: Cranial nerves 2-12 are intact.     Sensory: Sensation is intact.     Motor: Weakness present.     Coordination: Coordination is intact.     Deep Tendon Reflexes: Babinski sign absent on the right side. Babinski sign absent on the left side.     Reflex Scores:      Tricep reflexes are 2+ on the right side and 2+ on the left side.      Bicep reflexes are 2+ on the right side and 2+ on the left side.      Brachioradialis reflexes are 2+ on the right side and 2+ on the left side.      Patellar reflexes are 2+ on the right side and 2+ on  the left side.      Achilles reflexes are 2+ on the right side and 2+ on the left side.    Comments: At least 4/5 strength in the lower extremities.  Mildly decreased proprioception left great toe, normal on the right.  No hoffmans Gait not assessed     Assessment/Plan: Lumbar myelogram does not show a compressive lesion other than the left displaced disc at L4/5. This obviously does not account for urinary incontinence, nor lower extremity weakness. I do not believe there is a compressive lesion causing this. Other etiologies should be investigated.  Coletta Memos 11/15/2023, 6:23 PM

## 2023-11-15 NOTE — Progress Notes (Addendum)
 PROGRESS NOTE  [name]  HYQ:[id number] DOB: 03-May-1956 DOA: 11/14/2023 PCP: [name], FNP   Brief Narrative: Patient is a 68 year old female with history of COPD, bladder cancer, spinal stenosis who presented to the emergency department with complaint of back pain radiating to her hips, inability to ambulate, weakness of bilateral lower extremity, lack of bowel movement.  Hemodynamically stable on presentation.  MRI could not be done because of presence of pacemaker which was incompatible.  Case discussed with radiology, plan to do CT myelogram.   Placement of Foley catheter done.  Neurosurgery, urology following  Assessment & Plan:  Principal Problem:   Paraplegia (HCC) Active Problems:   AKI (acute kidney injury) (HCC)  Paraplegia/inability to ambulate: Sudden onset.  Was ambulatory without any problem before.  Presented with mid to low back pain radiating to hips.  No saddle anesthesia as per perianal examination by the ED provider.  Also had a urine retention, lack of bowel movement.  Currently on bedrest. Noncontrast CT of the thoracolumbar spine showed redemonstration of scattered sclerotic axial skeleton lesions suspicious for mets.Also showed L3-L4, L4-L5, L5-S1 disc bulge with question at least moderate central canal stenosis at the L4-L5 level.   Plan for DG myelogram.  Neuro surgery, Dr. Franky Macho closely following.  She has weakness in bilateral lower extremities.also requested neurology consult.She needs PT/OT eval at some point.Started on dexamethsone  Bladder cancer: CT imaging on March 4 showed concern for bladder wall thickening, neoplasm as well as obstructing mass near the right UVJ and hydronephrosis, concern for bony metastasis on the pubic rami or suspicious lesion.  Also has age-indeterminate T7 compression fracture.  CT imaging showed severe right hydroureteronephrosis.Urology consulted and following.  Possible need of cystoscopy, right ureteral stent placement,  possible right ureteroscopy and bladder biopsy as per urology.plan is to consult oncology, radiation oncology as well  AKI on CKD stage 3b: Known bladder cancer with obstructing mass near the right uterovesical junction.  Found to have retaining urine with 800 cc of urine drained after Foley placement.  Continue Foley catheter.  Patient most likely from obstructive uropathy.  Presented with leukocytosis so started on ceftriaxone.  Baseline creatinine ranging from 1.7-2.  Kidney function improving.  Acute minimally displaced right posterior first rib fracture: Continue supportive care, pain medications      DVT prophylaxis:SCDs Start: 11/14/23 2033     Code Status: Full Code  Family Communication: Called and discussed with boyfriend Dorinda Hill on phone on 3/22  Patient status: Inpatient  Patient is from : Home  Anticipated discharge to: Not sure   Estimated DC date: Not sure   Consultants: Neurosurgery, urology  Procedures: None yet  Antimicrobials:  Anti-infectives (From admission, onward)    Start     Dose/Rate Route Frequency Ordered Stop   11/14/23 2030  cefTRIAXone (ROCEPHIN) 1 g in sodium chloride 0.9 % 100 mL IVPB        1 g 200 mL/hr over 30 Minutes Intravenous Every 24 hours 11/14/23 2027 11/17/23 2029       Subjective: Patient seen and examined at the bedside today.  Hemodynamically stable.  Overall comfortable.  Lying in bed.  Complains of back pain, radiating to her bilateral lower extremities.  Has  weakness in bilateral lower extremities but able to move her feet.  Complains of mild lower abdominal discomfort but no nausea or vomiting.  Objective: Vitals:   11/15/23 0000 11/15/23 0011 11/15/23 0015 11/15/23 0146  BP:  101/68  109/72  Pulse: 79  85  81  Resp:  16  18  Temp:   97.7 F (36.5 C) 97.7 F (36.5 C)  TempSrc:   Oral   SpO2: 99% 100%  100%  Weight:      Height:        Intake/Output Summary (Last 24 hours) at 11/15/2023 0747 Last data filed at  11/14/2023 2253 Gross per 24 hour  Intake 100 ml  Output --  Net 100 ml   Filed Weights   11/14/23 1415  Weight: 67 kg    Examination:  General exam: Overall comfortable, not in distress, pleasant female HEENT: PERRL Respiratory system:  no wheezes or crackles  Cardiovascular system: S1 & S2 heard, RRR.  Gastrointestinal system: Abdomen is nondistended, soft and nontender. Central nervous system: Alert and oriented, weakness in bilateral lower extremities with motor of 2/3 bilaterally Extremities: No edema, no clubbing ,no cyanosis Skin: No rashes, no ulcers,no icterus     Data Reviewed: I have personally reviewed following labs and imaging studies  CBC: Recent Labs  Lab 11/14/23 1649 11/15/23 0320  WBC 12.4* 11.8*  NEUTROABS 10.6*  --   HGB 13.2 11.5*  HCT 41.9 35.7*  MCV 91.9 90.4  PLT 361 331   Basic Metabolic Panel: Recent Labs  Lab 11/14/23 1649 11/15/23 0320  NA 135 136  K 4.5 4.1  CL 101 106  CO2 19* 21*  GLUCOSE 147* 125*  BUN 83* 79*  CREATININE 2.79* 2.49*  CALCIUM 9.5 8.6*     No results found for this or any previous visit (from the past 240 hours).   Radiology Studies: CT THORACIC SPINE WO CONTRAST Result Date: 11/14/2023 CLINICAL DATA:  Mid-back pain, abnormal neuro, positive xray (Ped 0-17y); Low back pain, cauda equina syndrome suspected EXAM: CT THORACIC AND LUMBAR SPINE WITHOUT CONTRAST TECHNIQUE: Multidetector CT imaging of the thoracic and lumbar spine was performed without contrast. Multiplanar CT image reconstructions were also generated. RADIATION DOSE REDUCTION: This exam was performed according to the departmental dose-optimization program which includes automated exposure control, adjustment of the mA and/or kV according to patient size and/or use of iterative reconstruction technique. COMPARISON:  MRI lumbar spine 12/12/2008, CT chest 10/17/2023: CT abdomen pelvis 10/28/2023 FINDINGS: CT THORACIC SPINE FINDINGS Alignment: Normal.  Vertebrae: Diffusely decreased bone density. Interval development of an age-indeterminate T1 compression fracture with at least 40% vertebral body height loss. Chronic stable T5, T7 compression fracture. Redemonstration of scattered sclerotic axial skeleton lesions. Multilevel mild degenerative changes of the spine. Paraspinal and other soft tissues: Negative. Disc levels: Maintained. CT LUMBAR SPINE FINDINGS Segmentation: 5 lumbar type vertebrae. Alignment: Normal. Vertebrae: Diffusely decreased bone density. No severe osseous neural foraminal or central canal stenosis. L3-L4, L4-L5, L5-S1 disc bulge with question at least moderate central canal stenosis at the L4-L5 level. No acute fracture or focal pathologic process. Paraspinal and other soft tissues: Negative. Disc levels: Maintained. Other:. Diffuse bronchial wall thickening. Limited evaluation of the lungs due to motion artifact. Severe right hydroureteronephrosis that is partially visualized. Atherosclerotic plaque. Cardiac leads. Status post cholecystectomy. Acute minimally displaced right posterior first rib fracture (5:21). IMPRESSION: IMPRESSION CT THORACIC SPINE IMPRESSION 1. No acute displaced fracture or traumatic listhesis of the thoracic spine. 2. Redemonstration of scattered sclerotic axial skeleton lesions. Question metastases. CT LUMBAR SPINE IMPRESSION 1. No acute displaced fracture or traumatic listhesis of the lumbar spine. 2. L3-L4, L4-L5, L5-S1 disc bulge with question at least moderate central canal stenosis at the L4-L5 level. Recommend MRI for further evaluation. Other  imaging findings of potential clinical significance: 1. Acute minimally displaced right posterior first rib fracture 2. Severe right hydroureteronephrosis partially visualized. Finding better evaluated on CT abdomen pelvis 10/28/2023 3.  Aortic Atherosclerosis (ICD10-I70.0). Electronically Signed   By: Tish Frederickson M.D.   On: 11/14/2023 20:53   CT LUMBAR SPINE WO  CONTRAST Result Date: 11/14/2023 CLINICAL DATA:  Mid-back pain, abnormal neuro, positive xray (Ped 0-17y); Low back pain, cauda equina syndrome suspected EXAM: CT THORACIC AND LUMBAR SPINE WITHOUT CONTRAST TECHNIQUE: Multidetector CT imaging of the thoracic and lumbar spine was performed without contrast. Multiplanar CT image reconstructions were also generated. RADIATION DOSE REDUCTION: This exam was performed according to the departmental dose-optimization program which includes automated exposure control, adjustment of the mA and/or kV according to patient size and/or use of iterative reconstruction technique. COMPARISON:  MRI lumbar spine 12/12/2008, CT chest 10/17/2023: CT abdomen pelvis 10/28/2023 FINDINGS: CT THORACIC SPINE FINDINGS Alignment: Normal. Vertebrae: Diffusely decreased bone density. Interval development of an age-indeterminate T1 compression fracture with at least 40% vertebral body height loss. Chronic stable T5, T7 compression fracture. Redemonstration of scattered sclerotic axial skeleton lesions. Multilevel mild degenerative changes of the spine. Paraspinal and other soft tissues: Negative. Disc levels: Maintained. CT LUMBAR SPINE FINDINGS Segmentation: 5 lumbar type vertebrae. Alignment: Normal. Vertebrae: Diffusely decreased bone density. No severe osseous neural foraminal or central canal stenosis. L3-L4, L4-L5, L5-S1 disc bulge with question at least moderate central canal stenosis at the L4-L5 level. No acute fracture or focal pathologic process. Paraspinal and other soft tissues: Negative. Disc levels: Maintained. Other:. Diffuse bronchial wall thickening. Limited evaluation of the lungs due to motion artifact. Severe right hydroureteronephrosis that is partially visualized. Atherosclerotic plaque. Cardiac leads. Status post cholecystectomy. Acute minimally displaced right posterior first rib fracture (5:21). IMPRESSION: IMPRESSION CT THORACIC SPINE IMPRESSION 1. No acute displaced  fracture or traumatic listhesis of the thoracic spine. 2. Redemonstration of scattered sclerotic axial skeleton lesions. Question metastases. CT LUMBAR SPINE IMPRESSION 1. No acute displaced fracture or traumatic listhesis of the lumbar spine. 2. L3-L4, L4-L5, L5-S1 disc bulge with question at least moderate central canal stenosis at the L4-L5 level. Recommend MRI for further evaluation. Other imaging findings of potential clinical significance: 1. Acute minimally displaced right posterior first rib fracture 2. Severe right hydroureteronephrosis partially visualized. Finding better evaluated on CT abdomen pelvis 10/28/2023 3.  Aortic Atherosclerosis (ICD10-I70.0). Electronically Signed   By: Tish Frederickson M.D.   On: 11/14/2023 20:53    Scheduled Meds:  fluticasone furoate-vilanterol  1 puff Inhalation Daily   senna-docusate  2 tablet Oral BID   sodium chloride flush  3 mL Intravenous Q12H   Continuous Infusions:  cefTRIAXone (ROCEPHIN)  IV Stopped (11/14/23 2253)     LOS: 1 day   Burnadette Pop, MD [facility] P3/22/2025, 7:47 AM

## 2023-11-15 NOTE — Progress Notes (Signed)
 New Admission Note:    Arrival Method: ED stretcher Mental Orientation: a/ox4 Telemetry: box 4 Assessment:completed Skin: intact IV: rt arm Pain:10/10 back Tubes: n/a Safety Measures: nonskid socks bed alarm Admission: completed Orientation: Patient has been oriented to the room, unit and staff.  Family: n/a Belongings:at bedside   Orders have been reviewed and implemented. Will continue to monitor the patient. Call light has been placed within reach and bed alarm has been activated.   Fabian Sharp BSN, RN-BC Phone number: [phone]

## 2023-11-16 ENCOUNTER — Inpatient Hospital Stay (HOSPITAL_COMMUNITY)

## 2023-11-16 ENCOUNTER — Encounter (HOSPITAL_COMMUNITY)
Admission: EM | Disposition: A | Discharge status: Skilled Nursing Facility | Payer: Self-pay | Source: Home / Self Care | Attending: Internal Medicine

## 2023-11-16 ENCOUNTER — Inpatient Hospital Stay (HOSPITAL_COMMUNITY): Admitting: Anesthesiology

## 2023-11-16 ENCOUNTER — Encounter (HOSPITAL_COMMUNITY): Payer: Self-pay | Admitting: Internal Medicine

## 2023-11-16 DIAGNOSIS — G8220 Paraplegia, unspecified: Secondary | ICD-10-CM | POA: Diagnosis not present

## 2023-11-16 DIAGNOSIS — J449 Chronic obstructive pulmonary disease, unspecified: Secondary | ICD-10-CM | POA: Diagnosis not present

## 2023-11-16 DIAGNOSIS — N1330 Unspecified hydronephrosis: Secondary | ICD-10-CM

## 2023-11-16 DIAGNOSIS — Z87891 Personal history of nicotine dependence: Secondary | ICD-10-CM | POA: Diagnosis not present

## 2023-11-16 DIAGNOSIS — N329 Bladder disorder, unspecified: Secondary | ICD-10-CM | POA: Diagnosis not present

## 2023-11-16 HISTORY — PX: TRANSURETHRAL RESECTION OF BLADDER TUMOR: SHX2575

## 2023-11-16 HISTORY — PX: IR NEPHROSTOMY PLACEMENT RIGHT: IMG6064

## 2023-11-16 LAB — BASIC METABOLIC PANEL
Anion gap: 13 (ref 5–15)
BUN: 61 mg/dL — ABNORMAL HIGH (ref 8–23)
CO2: 17 mmol/L — ABNORMAL LOW (ref 22–32)
Calcium: 8.4 mg/dL — ABNORMAL LOW (ref 8.9–10.3)
Chloride: 111 mmol/L (ref 98–111)
Creatinine, Ser: 1.75 mg/dL — ABNORMAL HIGH (ref 0.44–1.00)
GFR, Estimated: 31 mL/min — ABNORMAL LOW (ref >60)
Glucose, Bld: 89 mg/dL (ref 70–99)
Potassium: 4.5 mmol/L (ref 3.5–5.1)
Sodium: 141 mmol/L (ref 135–145)

## 2023-11-16 SURGERY — TURBT (TRANSURETHRAL RESECTION OF BLADDER TUMOR)
Anesthesia: General | Site: Bladder

## 2023-11-16 MED ORDER — MIDAZOLAM HCL 2 MG/2ML IJ SOLN
INTRAMUSCULAR | Status: AC
  Filled 2023-11-16: qty 6

## 2023-11-16 MED ORDER — LIDOCAINE-EPINEPHRINE 1 %-1:100000 IJ SOLN
20.0000 mL | Freq: Once | INTRAMUSCULAR | Status: AC
  Administered 2023-11-16: 6 mL via INTRADERMAL

## 2023-11-16 MED ORDER — FENTANYL CITRATE (PF) 100 MCG/2ML IJ SOLN
INTRAMUSCULAR | Status: AC
  Filled 2023-11-16: qty 2

## 2023-11-16 MED ORDER — ACETAMINOPHEN 10 MG/ML IV SOLN
INTRAVENOUS | Status: AC
  Filled 2023-11-16: qty 100

## 2023-11-16 MED ORDER — PROPOFOL 10 MG/ML IV BOLUS
INTRAVENOUS | Status: DC | PRN
  Administered 2023-11-16: 120 mg via INTRAVENOUS

## 2023-11-16 MED ORDER — PHENYLEPHRINE HCL-NACL 20-0.9 MG/250ML-% IV SOLN
INTRAVENOUS | Status: DC | PRN
  Administered 2023-11-16: 30 ug/min via INTRAVENOUS

## 2023-11-16 MED ORDER — FENTANYL CITRATE (PF) 100 MCG/2ML IJ SOLN
INTRAMUSCULAR | Status: DC | PRN
  Administered 2023-11-16: 50 ug via INTRAVENOUS
  Administered 2023-11-16: 100 ug via INTRAVENOUS
  Administered 2023-11-16: 50 ug via INTRAVENOUS

## 2023-11-16 MED ORDER — IOHEXOL 300 MG/ML  SOLN
50.0000 mL | Freq: Once | INTRAMUSCULAR | Status: AC | PRN
  Administered 2023-11-16: 15 mL

## 2023-11-16 MED ORDER — SODIUM BICARBONATE 650 MG PO TABS
650.0000 mg | ORAL_TABLET | Freq: Two times a day (BID) | ORAL | Status: DC
  Administered 2023-11-16 – 2023-11-24 (×16): 650 mg via ORAL
  Filled 2023-11-16 (×17): qty 1

## 2023-11-16 MED ORDER — ONDANSETRON HCL 4 MG/2ML IJ SOLN
INTRAMUSCULAR | Status: AC
  Filled 2023-11-16: qty 2

## 2023-11-16 MED ORDER — CEFAZOLIN SODIUM-DEXTROSE 2-3 GM-%(50ML) IV SOLR
INTRAVENOUS | Status: DC | PRN
Start: 2023-11-16 — End: 2023-11-16
  Administered 2023-11-16: 2 g via INTRAVENOUS

## 2023-11-16 MED ORDER — SODIUM CHLORIDE 0.9 % IR SOLN
Status: DC | PRN
  Administered 2023-11-16 (×2): 6000 mL via INTRAVESICAL

## 2023-11-16 MED ORDER — ONDANSETRON HCL 4 MG/2ML IJ SOLN
4.0000 mg | Freq: Four times a day (QID) | INTRAMUSCULAR | Status: AC | PRN
  Administered 2023-11-16: 4 mg via INTRAVENOUS

## 2023-11-16 MED ORDER — LIDOCAINE HCL 1 % IJ SOLN
INTRAMUSCULAR | Status: AC
  Filled 2023-11-16: qty 20

## 2023-11-16 MED ORDER — DEXAMETHASONE SODIUM PHOSPHATE 10 MG/ML IJ SOLN
INTRAMUSCULAR | Status: AC
  Filled 2023-11-16: qty 1

## 2023-11-16 MED ORDER — SODIUM CHLORIDE 0.9 % IV SOLN: 2.0000 g | INTRAVENOUS | Status: AC

## 2023-11-16 MED ORDER — LIDOCAINE HCL (CARDIAC) PF 100 MG/5ML IV SOSY
PREFILLED_SYRINGE | INTRAVENOUS | Status: DC | PRN
Start: 2023-11-16 — End: 2023-11-16
  Administered 2023-11-16: 60 mg via INTRATRACHEAL

## 2023-11-16 MED ORDER — DEXAMETHASONE SODIUM PHOSPHATE 10 MG/ML IJ SOLN
INTRAMUSCULAR | Status: DC | PRN
Start: 2023-11-16 — End: 2023-11-16
  Administered 2023-11-16: 5 mg via INTRAVENOUS

## 2023-11-16 MED ORDER — FENTANYL CITRATE PF 50 MCG/ML IJ SOSY: 25.0000 ug | PREFILLED_SYRINGE | INTRAMUSCULAR | Status: DC | PRN

## 2023-11-16 MED ORDER — FENTANYL CITRATE (PF) 100 MCG/2ML IJ SOLN
INTRAMUSCULAR | Status: AC
  Filled 2023-11-16: qty 6

## 2023-11-16 MED ORDER — PHENYLEPHRINE 80 MCG/ML (10ML) SYRINGE FOR IV PUSH (FOR BLOOD PRESSURE SUPPORT)
PREFILLED_SYRINGE | INTRAVENOUS | Status: DC | PRN
Start: 2023-11-16 — End: 2023-11-16
  Administered 2023-11-16: 160 ug via INTRAVENOUS

## 2023-11-16 MED ORDER — OXYCODONE HCL 5 MG/5ML PO SOLN: 5.0000 mg | Freq: Once | ORAL | Status: DC | PRN

## 2023-11-16 MED ORDER — ACETAMINOPHEN 10 MG/ML IV SOLN
INTRAVENOUS | Status: DC | PRN
  Administered 2023-11-16: 1000 mg via INTRAVENOUS

## 2023-11-16 MED ORDER — OXYCODONE HCL 5 MG PO TABS: 5.0000 mg | ORAL_TABLET | Freq: Once | ORAL | Status: DC | PRN

## 2023-11-16 MED ORDER — MIDAZOLAM HCL 2 MG/2ML IJ SOLN
INTRAMUSCULAR | Status: AC | PRN
  Administered 2023-11-16 (×2): 1 mg via INTRAVENOUS

## 2023-11-16 MED ORDER — LIDOCAINE-EPINEPHRINE 1 %-1:100000 IJ SOLN
INTRAMUSCULAR | Status: AC
  Filled 2023-11-16: qty 1

## 2023-11-16 MED ORDER — CEFAZOLIN SODIUM-DEXTROSE 2-4 GM/100ML-% IV SOLN
INTRAVENOUS | Status: AC
  Filled 2023-11-16: qty 100

## 2023-11-16 MED ORDER — LACTATED RINGERS IV SOLN: INTRAVENOUS | Status: DC | PRN

## 2023-11-16 MED ORDER — SODIUM CHLORIDE 0.9% FLUSH
3.0000 mL | Freq: Two times a day (BID) | INTRAVENOUS | Status: DC
  Administered 2023-11-16: 3 mL via INTRAVENOUS
  Administered 2023-11-17: 10 mL via INTRAVENOUS
  Administered 2023-11-17: 5 mL via INTRAVENOUS
  Administered 2023-11-18 (×2): 10 mL via INTRAVENOUS
  Administered 2023-11-19: 5 mL via INTRAVENOUS
  Administered 2023-11-20 (×2): 10 mL via INTRAVENOUS
  Administered 2023-11-20: 3 mL via INTRAVENOUS
  Administered 2023-11-21: 10 mL via INTRAVENOUS
  Administered 2023-11-21: 3 mL via INTRAVENOUS
  Administered 2023-11-22 – 2023-11-23 (×2): 10 mL via INTRAVENOUS
  Administered 2023-11-24: 3 mL via INTRAVENOUS

## 2023-11-16 MED ORDER — SUGAMMADEX SODIUM 200 MG/2ML IV SOLN
INTRAVENOUS | Status: DC | PRN
Start: 2023-11-16 — End: 2023-11-16
  Administered 2023-11-16: 200 mg via INTRAVENOUS

## 2023-11-16 MED ORDER — FENTANYL CITRATE (PF) 100 MCG/2ML IJ SOLN
INTRAMUSCULAR | Status: AC | PRN
  Administered 2023-11-16 (×2): 50 ug via INTRAVENOUS

## 2023-11-16 MED ORDER — SUGAMMADEX SODIUM 200 MG/2ML IV SOLN
INTRAVENOUS | Status: AC
  Filled 2023-11-16: qty 2

## 2023-11-16 MED ORDER — ROCURONIUM 10MG/ML (10ML) SYRINGE FOR MEDFUSION PUMP - OPTIME
INTRAVENOUS | Status: DC | PRN
  Administered 2023-11-16: 50 mg via INTRAVENOUS

## 2023-11-16 SURGICAL SUPPLY — 17 items
BAG URINE DRAIN 2000ML AR STRL (UROLOGICAL SUPPLIES) IMPLANT
BAG URO CATCHER STRL LF (MISCELLANEOUS) ×3 IMPLANT
CATH URETL OPEN 5X70 (CATHETERS) ×3 IMPLANT
CLOTH BEACON ORANGE TIMEOUT ST (SAFETY) ×3 IMPLANT
DRAPE FOOT SWITCH (DRAPES) ×3 IMPLANT
ELECT REM PT RETURN 15FT ADLT (MISCELLANEOUS) ×3 IMPLANT
GLOVE SURG LX STRL 8.0 MICRO (GLOVE) ×3 IMPLANT
GOWN STRL SURGICAL XL XLNG (GOWN DISPOSABLE) ×3 IMPLANT
GUIDEWIRE ZIPWRE .038 STRAIGHT (WIRE) ×4 IMPLANT
KIT TURNOVER KIT A (KITS) IMPLANT
LOOP CUT BIPOLAR 24F LRG (ELECTROSURGICAL) IMPLANT
MANIFOLD NEPTUNE II (INSTRUMENTS) ×3 IMPLANT
PACK CYSTO (CUSTOM PROCEDURE TRAY) ×3 IMPLANT
SYR TOOMEY IRRIG 70ML (MISCELLANEOUS) IMPLANT
SYRINGE TOOMEY IRRIG 70ML (MISCELLANEOUS) IMPLANT
TUBING CONNECTING 10 (TUBING) ×3 IMPLANT
TUBING UROLOGY SET (TUBING) ×3 IMPLANT

## 2023-11-16 NOTE — Progress Notes (Signed)
 Subjective: Pt still complaining of 8 out of 10 back and LE pain.  Renal function is improving.  CT spine is now showing possible metastatic lesions w/o cord compression.   Objective: Vital signs in last 24 hours: Temp:  [97.8 F (36.6 C)-98.6 F (37 C)] 97.8 F (36.6 C) (03/23 0456) Pulse Rate:  [72-97] 72 (03/23 0456) BP: (108-125)/(61-79) 118/67 (03/23 0456) SpO2:  [98 %-100 %] 99 % (03/23 0456)  Intake/Output from previous day: 03/22 0701 - 03/23 0700 In: 155.1 [I.V.:155.1] Out: 800 [Urine:800]  Intake/Output this shift: No intake/output data recorded.  Physical Exam:  General: Alert and oriented CV: RRR, palpable distal pulses Lungs: CTAB, equal chest rise Abdomen: Soft, NTND, no rebound or guarding Gu: Foley draining clear urine Ext: NT, No erythema  Lab Results: Recent Labs    11/14/23 1649 11/15/23 0320  HGB 13.2 11.5*  HCT 41.9 35.7*   BMET Recent Labs    11/15/23 0320 11/16/23 0624  NA 136 141  K 4.1 4.5  CL 106 111  CO2 21* 17*  GLUCOSE 125* 89  BUN 79* 61*  CREATININE 2.49* 1.75*  CALCIUM 8.6* 8.4*     Studies/Results: CT THORACIC SPINE WO CONTRAST Result Date: 11/15/2023 CLINICAL DATA:  Back pain. Lower extremity weakness. Question cauda acquired syndrome. EXAM: CT MYELOGRAPHY LUMBAR SPINE TECHNIQUE: CT imaging of the thoracic and lumbar spine was performed after Isovue 300M contrast administration. Multiplanar CT image reconstructions were also generated. RADIATION DOSE REDUCTION: This exam was performed according to the departmental dose-optimization program which includes automated exposure control, adjustment of the mA and/or kV according to patient size and/or use of iterative reconstruction technique. COMPARISON:  CT of the thoracic and lumbar spine 11/14/2023. FINDINGS: Thoracic spine Alignment: No significant listhesis is present. Mild rightward curvature is centered at T6. Mild straightening of the normal thoracic kyphosis is stable.  Vertebrae: The superior endplate compression fracture at L1 is more severe on the left. Slight retropulsed bone is present without focal stenosis. The superior endplate fracture at T5 demonstrates 30-40% loss of height. No retropulsed bone is present. This may be subacute. Superior endplate fracture at T7 is more acute, with 40-50% loss of height. Slight retropulsed bone is present without significant stenosis. Progressive collapse is present at this level. Sclerotic lesions are present on the left at T2 and T3 extending into the left pedicle at T3. Conus medullaris: Extends to the  level and appears normal. Paraspinal and other soft tissues: The paraspinous soft tissues are within normal limits. The visualized lung fields are clear. Pacing wires are in place. Severe right-sided hydronephrosis is again noted. Disc levels: No significant central canal stenosis or cord compression is present. Mild right foraminal narrowing present at T8-9, T9-10 and T10-11. Mild left foraminal narrowing present at T5-6 and T6-7. Lumbar spine Segmentation: 5 non rib-bearing lumbar type vertebral bodies are present. The lowest fully formed vertebral body is L5. Alignment: No significant listhesis is present. Levoconvex curvature is centered at L1-2. Mild straightening of the normal lumbar lordosis is present. Vertebrae: Vertebral body heights are normal. No acute or healing fractures are present. Conus medullaris: Extends to the T12-L1   Level and appears normal. Paraspinal and other soft tissues: Atherosclerotic calcifications are present the aorta and branch vessels. No adenopathy is present. Severe right-sided hydronephrosis is again noted. Disc levels: L1-2: Normal disc signal and height is present. No focal protrusion or stenosis is present. L2-3: Normal disc signal and height is present. No focal protrusion or stenosis is  present. L3-4: A mild broad-based disc bulge is present. Mild facet hypertrophy is noted bilaterally. Disc  material extends into the foramina bilaterally. Mild right foraminal narrowing is present. L4-5: A leftward disc protrusion is present. Moderate left subarticular and foraminal narrowing is present. Mild right foraminal narrowing is present. L5-S1: Chronic loss of disc height is present. A leftward disc protrusion likely contacts the traversing left S1 nerve roots. Facet spurring contributes to mild foraminal narrowing bilaterally, left greater than right. IMPRESSION: 1. No significant cord compression or central canal stenosis to explain the patient's symptoms. 2. Acute superior endplate compression fracture at T7 with 40-50% loss of height. Slight retropulsed bone is present without significant stenosis. 3. Superior endplate compression fracture at T5 with 30-40% loss of height. No retropulsed bone is present. This may be subacute. 4. Superior endplate compression fracture at L1 is more severe on the left. Slight retropulsed bone is present without focal stenosis. 5. Sclerotic lesions on the left at T2 and T3 extending into the left pedicle at T3. These are concerning for metastatic disease. 6. Mild right foraminal narrowing at T8-9, T9-10 and T10-11. 7. Mild left foraminal narrowing at T5-6 and T6-7. 8. Moderate left subarticular and foraminal narrowing at L4-5. 9. Mild foraminal narrowing bilaterally at L5-S1, left greater than right. 10. Severe right-sided hydronephrosis is again noted. 11.  Aortic Atherosclerosis (ICD10-I70.0). These results were called by telephone at the time of interpretation on 11/15/2023 at 1:50 pm to Dr. Coletta Memos, Who verbally acknowledged these results. Electronically Signed   By: Marin Roberts M.D.   On: 11/15/2023 15:31   CT LUMBAR SPINE WO CONTRAST Result Date: 11/15/2023 CLINICAL DATA:  Back pain. Lower extremity weakness. Question cauda acquired syndrome. EXAM: CT MYELOGRAPHY LUMBAR SPINE TECHNIQUE: CT imaging of the thoracic and lumbar spine was performed after Isovue  300M contrast administration. Multiplanar CT image reconstructions were also generated. RADIATION DOSE REDUCTION: This exam was performed according to the departmental dose-optimization program which includes automated exposure control, adjustment of the mA and/or kV according to patient size and/or use of iterative reconstruction technique. COMPARISON:  CT of the thoracic and lumbar spine 11/14/2023. FINDINGS: Thoracic spine Alignment: No significant listhesis is present. Mild rightward curvature is centered at T6. Mild straightening of the normal thoracic kyphosis is stable. Vertebrae: The superior endplate compression fracture at L1 is more severe on the left. Slight retropulsed bone is present without focal stenosis. The superior endplate fracture at T5 demonstrates 30-40% loss of height. No retropulsed bone is present. This may be subacute. Superior endplate fracture at T7 is more acute, with 40-50% loss of height. Slight retropulsed bone is present without significant stenosis. Progressive collapse is present at this level. Sclerotic lesions are present on the left at T2 and T3 extending into the left pedicle at T3. Conus medullaris: Extends to the  level and appears normal. Paraspinal and other soft tissues: The paraspinous soft tissues are within normal limits. The visualized lung fields are clear. Pacing wires are in place. Severe right-sided hydronephrosis is again noted. Disc levels: No significant central canal stenosis or cord compression is present. Mild right foraminal narrowing present at T8-9, T9-10 and T10-11. Mild left foraminal narrowing present at T5-6 and T6-7. Lumbar spine Segmentation: 5 non rib-bearing lumbar type vertebral bodies are present. The lowest fully formed vertebral body is L5. Alignment: No significant listhesis is present. Levoconvex curvature is centered at L1-2. Mild straightening of the normal lumbar lordosis is present. Vertebrae: Vertebral body heights are normal. No  acute  or healing fractures are present. Conus medullaris: Extends to the T12-L1   Level and appears normal. Paraspinal and other soft tissues: Atherosclerotic calcifications are present the aorta and branch vessels. No adenopathy is present. Severe right-sided hydronephrosis is again noted. Disc levels: L1-2: Normal disc signal and height is present. No focal protrusion or stenosis is present. L2-3: Normal disc signal and height is present. No focal protrusion or stenosis is present. L3-4: A mild broad-based disc bulge is present. Mild facet hypertrophy is noted bilaterally. Disc material extends into the foramina bilaterally. Mild right foraminal narrowing is present. L4-5: A leftward disc protrusion is present. Moderate left subarticular and foraminal narrowing is present. Mild right foraminal narrowing is present. L5-S1: Chronic loss of disc height is present. A leftward disc protrusion likely contacts the traversing left S1 nerve roots. Facet spurring contributes to mild foraminal narrowing bilaterally, left greater than right. IMPRESSION: 1. No significant cord compression or central canal stenosis to explain the patient's symptoms. 2. Acute superior endplate compression fracture at T7 with 40-50% loss of height. Slight retropulsed bone is present without significant stenosis. 3. Superior endplate compression fracture at T5 with 30-40% loss of height. No retropulsed bone is present. This may be subacute. 4. Superior endplate compression fracture at L1 is more severe on the left. Slight retropulsed bone is present without focal stenosis. 5. Sclerotic lesions on the left at T2 and T3 extending into the left pedicle at T3. These are concerning for metastatic disease. 6. Mild right foraminal narrowing at T8-9, T9-10 and T10-11. 7. Mild left foraminal narrowing at T5-6 and T6-7. 8. Moderate left subarticular and foraminal narrowing at L4-5. 9. Mild foraminal narrowing bilaterally at L5-S1, left greater than right. 10.  Severe right-sided hydronephrosis is again noted. 11.  Aortic Atherosclerosis (ICD10-I70.0). These results were called by telephone at the time of interpretation on 11/15/2023 at 1:50 pm to Dr. Coletta Memos, Who verbally acknowledged these results. Electronically Signed   By: Marin Roberts M.D.   On: 11/15/2023 15:31   DG MYELOGRAPHY LUMBAR INJ MULTI REGION Result Date: 11/15/2023 CLINICAL DATA:  68 year old female with history of back pain, lower extremity weakness. Contrast injection for CT myelogram requested. EXAM: LUMBAR MYELOGRAM FLUOROSCOPY: dictate in minutes and seconds PROCEDURE: After thorough discussion of risks and benefits of the procedure including bleeding, infection, injury to nerves, blood vessels, adjacent structures as well as headache and CSF leak, written and oral informed consent was obtained. Time out form was completed. Patient was positioned prone on the fluoroscopy table. Local anesthesia was provided with 1% lidocaine without epinephrine after prepped and draped in the usual sterile fashion. Puncture was performed at L2-L3 using a 3 1/2 inch 20-gauge spinal needle via left approach. Using a single pass through the dura, the needle was placed within the thecal sac, with return of clear CSF. 15 mL of Omnipaque 180 was injected into the thecal sac, with normal opacification of the nerve roots and cauda equina consistent with free flow within the subarachnoid space. FINDINGS: Contiguous axial images were obtained through the Lumbar spine after the intrathecal infusion with appropriate contrast dispersal. IMPRESSION: Successful intrathecal administration of contrast for CT myelogram. Performed by: Loyce Dys PA-C Supervised by Dr. Chryl Heck, MD Electronically Signed   By: Malachy Moan M.D.   On: 11/15/2023 14:34   CT THORACIC SPINE WO CONTRAST Result Date: 11/14/2023 CLINICAL DATA:  Mid-back pain, abnormal neuro, positive xray (Ped 0-17y); Low back pain, cauda equina  syndrome suspected  EXAM: CT THORACIC AND LUMBAR SPINE WITHOUT CONTRAST TECHNIQUE: Multidetector CT imaging of the thoracic and lumbar spine was performed without contrast. Multiplanar CT image reconstructions were also generated. RADIATION DOSE REDUCTION: This exam was performed according to the departmental dose-optimization program which includes automated exposure control, adjustment of the mA and/or kV according to patient size and/or use of iterative reconstruction technique. COMPARISON:  MRI lumbar spine 12/12/2008, CT chest 10/17/2023: CT abdomen pelvis 10/28/2023 FINDINGS: CT THORACIC SPINE FINDINGS Alignment: Normal. Vertebrae: Diffusely decreased bone density. Interval development of an age-indeterminate T1 compression fracture with at least 40% vertebral body height loss. Chronic stable T5, T7 compression fracture. Redemonstration of scattered sclerotic axial skeleton lesions. Multilevel mild degenerative changes of the spine. Paraspinal and other soft tissues: Negative. Disc levels: Maintained. CT LUMBAR SPINE FINDINGS Segmentation: 5 lumbar type vertebrae. Alignment: Normal. Vertebrae: Diffusely decreased bone density. No severe osseous neural foraminal or central canal stenosis. L3-L4, L4-L5, L5-S1 disc bulge with question at least moderate central canal stenosis at the L4-L5 level. No acute fracture or focal pathologic process. Paraspinal and other soft tissues: Negative. Disc levels: Maintained. Other:. Diffuse bronchial wall thickening. Limited evaluation of the lungs due to motion artifact. Severe right hydroureteronephrosis that is partially visualized. Atherosclerotic plaque. Cardiac leads. Status post cholecystectomy. Acute minimally displaced right posterior first rib fracture (5:21). IMPRESSION: IMPRESSION CT THORACIC SPINE IMPRESSION 1. No acute displaced fracture or traumatic listhesis of the thoracic spine. 2. Redemonstration of scattered sclerotic axial skeleton lesions. Question  metastases. CT LUMBAR SPINE IMPRESSION 1. No acute displaced fracture or traumatic listhesis of the lumbar spine. 2. L3-L4, L4-L5, L5-S1 disc bulge with question at least moderate central canal stenosis at the L4-L5 level. Recommend MRI for further evaluation. Other imaging findings of potential clinical significance: 1. Acute minimally displaced right posterior first rib fracture 2. Severe right hydroureteronephrosis partially visualized. Finding better evaluated on CT abdomen pelvis 10/28/2023 3.  Aortic Atherosclerosis (ICD10-I70.0). Electronically Signed   By: Tish Frederickson M.D.   On: 11/14/2023 20:53   CT LUMBAR SPINE WO CONTRAST Result Date: 11/14/2023 CLINICAL DATA:  Mid-back pain, abnormal neuro, positive xray (Ped 0-17y); Low back pain, cauda equina syndrome suspected EXAM: CT THORACIC AND LUMBAR SPINE WITHOUT CONTRAST TECHNIQUE: Multidetector CT imaging of the thoracic and lumbar spine was performed without contrast. Multiplanar CT image reconstructions were also generated. RADIATION DOSE REDUCTION: This exam was performed according to the departmental dose-optimization program which includes automated exposure control, adjustment of the mA and/or kV according to patient size and/or use of iterative reconstruction technique. COMPARISON:  MRI lumbar spine 12/12/2008, CT chest 10/17/2023: CT abdomen pelvis 10/28/2023 FINDINGS: CT THORACIC SPINE FINDINGS Alignment: Normal. Vertebrae: Diffusely decreased bone density. Interval development of an age-indeterminate T1 compression fracture with at least 40% vertebral body height loss. Chronic stable T5, T7 compression fracture. Redemonstration of scattered sclerotic axial skeleton lesions. Multilevel mild degenerative changes of the spine. Paraspinal and other soft tissues: Negative. Disc levels: Maintained. CT LUMBAR SPINE FINDINGS Segmentation: 5 lumbar type vertebrae. Alignment: Normal. Vertebrae: Diffusely decreased bone density. No severe osseous neural  foraminal or central canal stenosis. L3-L4, L4-L5, L5-S1 disc bulge with question at least moderate central canal stenosis at the L4-L5 level. No acute fracture or focal pathologic process. Paraspinal and other soft tissues: Negative. Disc levels: Maintained. Other:. Diffuse bronchial wall thickening. Limited evaluation of the lungs due to motion artifact. Severe right hydroureteronephrosis that is partially visualized. Atherosclerotic plaque. Cardiac leads. Status post cholecystectomy. Acute minimally displaced right posterior first rib  fracture (5:21). IMPRESSION: IMPRESSION CT THORACIC SPINE IMPRESSION 1. No acute displaced fracture or traumatic listhesis of the thoracic spine. 2. Redemonstration of scattered sclerotic axial skeleton lesions. Question metastases. CT LUMBAR SPINE IMPRESSION 1. No acute displaced fracture or traumatic listhesis of the lumbar spine. 2. L3-L4, L4-L5, L5-S1 disc bulge with question at least moderate central canal stenosis at the L4-L5 level. Recommend MRI for further evaluation. Other imaging findings of potential clinical significance: 1. Acute minimally displaced right posterior first rib fracture 2. Severe right hydroureteronephrosis partially visualized. Finding better evaluated on CT abdomen pelvis 10/28/2023 3.  Aortic Atherosclerosis (ICD10-I70.0). Electronically Signed   By: Tish Frederickson M.D.   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   The risk, benefits and alternatives of cystoscopy with transurethral resection of bladder tumor and right ureteral stent placement was extensively discussed with the patient.  Risk include, but are not limited to, bleeding complications, infection, bladder perforation, inability to obtain retrograde access to the right  collecting system requiring percutaneous nephrostomy tube, MI, CVA, DVT and the inherent risk of general anesthesia.  She voices understanding and wishes to proceed.  I will transfer her to [facility] to perform this procedure and she will stay there for further care.   LOS: 2 days   Rhoderick Moody, MD [facility] Pager: [phone]  11/16/2023, 7:32 AM

## 2023-11-16 NOTE — Procedures (Signed)
 Interventional Radiology Procedure Note  Procedure: Right PCN placement, 40F  Complications: None  Estimated Blood Loss: None  Recommendations: - Tube to bag - F/U IR in 6 weeks   Signed,  Sterling Big, MD

## 2023-11-16 NOTE — Op Note (Signed)
 Operative Note  Preoperative diagnosis:  1.  5 cm bladder mass with concomitant severe right-sided hydronephrosis  Postoperative diagnosis: 1.  5 cm bladder mass with palpable anterior vaginal wall mass extending from the introitus to the posterior fornix 2.  Right ureteral orifice could not be identified endoscopically due to severe effacement of the bladder neck/trigone  Procedure(s): 1.  Cystoscopy with TURBT large  Surgeon: Rhoderick Moody, MD  Assistants:  None  Anesthesia:  General  Complications:  None  EBL: 10 mL  Specimens: 1.  Bladder mass   Drains/Catheters: 1.  18 French Foley catheter with 10 mL of sterile water in the balloon  Intraoperative findings:   Invasive 5 cm bladder mass extending from the bladder neck/trigone along the right lateral sidewall.  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 Severe effacement of the bladder neck/trigone.  Neither ureteral orifice could be clearly identified due to the effacement Palpable anterior vaginal wall mass extending from the introitus to the posterior fornix  Indication:  [name] is a 68 y.o. female with a five 5 cm bladder mass with concomitant severe right-sided hydronephrosis and right renal atrophy.  CT L and T-spine show features concerning for metastatic disease.  Description of procedure:  After informed consent was obtained, the patient was brought to the operating room and general anesthesia was administered. The patient was then placed in the dorsolithotomy position and prepped and draped in the usual sterile fashion. A timeout was performed. A 23 French rigid cystoscope was then inserted into the urethral meatus and advanced into the bladder under direct vision. A complete bladder survey revealed the findings listed above.  Multiple attempts were made to gain access to the right ureteral orifice, but it  could not be definitively identified.  The left ureteral orifice could not be clearly identified either due to effacement of the mass.  The rigid cystoscope was then removed and a 26 French resectoscope with a bipolar loop working element was then used to debulk the tumor that involves the bladder neck/trigone and right sidewall.  Following endoscopic debulking of the mass, I still could not identify the right ureteral orifice.  All tumor specimen were evacuated from the lumen of the bladder through the sheath of the resectoscope and sent for permanent section.  The area of resection was then extensively fulgurated until hemostasis was achieved.  An 38 French Foley catheter was placed with return of clear irrigant.  The Foley catheter was placed to gravity drainage.  She tolerated the procedure well and was transferred to the postanesthesia in stable condition.  Plan: I will consult interventional radiology to place a right sided percutaneous nephrostomy tube.  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

## 2023-11-16 NOTE — Transfer of Care (Signed)
 Immediate Anesthesia Transfer of Care Note  Patient: Addisynn Vassell Spurrier  Procedure(s) Performed: TURBT (TRANSURETHRAL RESECTION OF BLADDER TUMOR) (Bladder)  Patient Location: PACU  Anesthesia Type:General  Level of Consciousness: awake and patient cooperative  Airway & Oxygen Therapy: Patient Spontanous Breathing and Patient connected to face mask  Post-op Assessment: Report given to RN and Post -op Vital signs reviewed and stable  Post vital signs: Reviewed and stable  Last Vitals:  Vitals Value Taken Time  BP 121/71 11/16/23 1133  Temp    Pulse    Resp 26 11/16/23 1135  SpO2    Vitals shown include unfiled device data.  Last Pain:  Vitals:   11/16/23 1000  TempSrc:   PainSc: 10-Worst pain ever         Complications: No notable events documented.

## 2023-11-16 NOTE — Progress Notes (Signed)
 PROGRESS NOTE  [name]  ZOX:[id number] DOB: [birth year] DOA: 11/14/2023 PCP: [name], FNP   Brief Narrative: Patient is a 68 year old female with history of COPD, bladder cancer, spinal stenosis who presented to the emergency department with complaint of back pain radiating to her hips, inability to ambulate, weakness of bilateral lower extremity, lack of bowel movement.  Hemodynamically stable on presentation.  MRI could not be done because of presence of pacemaker which was incompatible.  CT myelogram done.  Neurology, neurosurgery, neurosurgery, oncology, radiation oncology consulted.  Back pain/lower extremity weakness suspected to be from metastatic bladder cancer.  Urology planning for cystoscopy with TURBT at [facility].  Patient will be transferred to [facility]  Assessment & Plan:  Principal Problem:   Paraplegia (HCC) Active Problems:   AKI (acute kidney injury) (HCC)  Paraplegia/inability to ambulate: Sudden onset.  Was ambulatory without any problem before.  Presented with mid to low back pain radiating to hips.  No saddle anesthesia as per perianal examination by the ED provider.  Also had a urine retention, lack of bowel movement. Noncontrast CT of the thoracolumbar spine showed redemonstration of scattered sclerotic axial skeleton lesions suspicious for mets.Also showed L3-L4, L4-L5, L5-S1 disc bulge with question at least moderate central canal stenosis at the L4-L5 level.   Also has age-indeterminate T7 compression fracture.   S/P myelogram.  Neuro surgery, Dr. Franky Macho consulted.  He said there is no role from neurosurgery .neurology also consulted with nonspecific recommendation.  After discussion with neurosurgery, radiation oncology, we started on dexamethasone.  Will consult PT/OT  Bladder cancer: CT imaging on March 4 showed concern for bladder wall thickening, neoplasm as well as obstructing mass near the right UVJ and hydronephrosis, concern for bony metastasis on the  pubic rami or suspicious lesion. CT imaging showed severe right hydroureteronephrosis.Urology consulted.  Status post cystoscopy today We discussed with oncology, Dr. Pamelia Hoit.  Dr. Cherly Hensen will follow him up on Monday.  Discussed with radiation oncology, Dr. Letta Kocher and she will follow-up with [place].  AKI on CKD stage 3b: Known bladder cancer with obstructing mass near the right uterovesical junction.  Found to have retaining urine with 800 cc of urine drained after Foley placement.  Continue Foley catheter.  Patient most likely from obstructive uropathy.  Presented with leukocytosis so started on ceftriaxone.  Baseline creatinine ranging from 1.7-2.  Kidney function improving.  Acute minimally displaced right posterior first rib fracture: Continue supportive care, pain medications  Constipation: Continue bowel regimen      DVT prophylaxis:SCDs Start: 11/14/23 2033     Code Status: Full Code  Family Communication: Called and discussed with boyfriend Dorinda Hill on phone on 3/23  Patient status: Inpatient  Patient is from : Home  Anticipated discharge to: Not sure   Estimated DC date: Not sure   Consultants: Neurosurgery, urology, oncology, radiation oncology, neurology  Procedures: Cystoscopy  Antimicrobials:  Anti-infectives (From admission, onward)    Start     Dose/Rate Route Frequency Ordered Stop   11/16/23 1003  ceFAZolin (ANCEF) 2-4 GM/100ML-% IVPB       Note to Pharmacy: Rodena Goldmann nan: cabinet override      11/16/23 1003 11/16/23 2214   11/14/23 2030  [MAR Hold]  cefTRIAXone (ROCEPHIN) 1 g in sodium chloride 0.9 % 100 mL IVPB        (MAR Hold since Sun 11/16/2023 at 0945.Hold Reason: Transfer to a Procedural area)   1 g 200 mL/hr over 30 Minutes Intravenous Every 24 hours  11/14/23 2027 11/17/23 2029       Subjective: Patient seen and examined at bedside today.  Hemodynamically stable.  Lying in bed.  She still complains of back pain.  B/L  lower extremity weakness might be  slightly improved today.  She is able to lift both feet slightly up from the bed.  No bowel movement for last several days.  Abdomen is soft, nondistended, with good bowel sounds..  Objective: Vitals:   11/16/23 0456 11/16/23 0822 11/16/23 1000 11/16/23 1134  BP: 118/67 108/68 127/78 121/71  Pulse: 72 72 69   Resp:   16 (!) 22  Temp: 97.8 F (36.6 C) 97.6 F (36.4 C) 97.6 F (36.4 C) 98.6 F (37 C)  TempSrc: Oral     SpO2: 99% 97% 96% 97%  Weight:      Height:        Intake/Output Summary (Last 24 hours) at 11/16/2023 1158 Last data filed at 11/16/2023 1122 Gross per 24 hour  Intake 655.13 ml  Output 1000 ml  Net -344.87 ml   Filed Weights   11/14/23 1415  Weight: 67 kg    Examination:  General exam: Overall comfortable, not in distress, lying in bed HEENT: PERRL Respiratory system:  no wheezes or crackles  Cardiovascular system: S1 & S2 heard, RRR.  Gastrointestinal system: Abdomen is nondistended, soft and nontender. Central nervous system: Alert and oriented, bilateral lower extremity weakness with motor of 3-4/5 Extremities: No edema, no clubbing ,no cyanosis Skin: No rashes, no ulcers,no icterus    GU: Foley   Data Reviewed: I have personally reviewed following labs and imaging studies  CBC: Recent Labs  Lab 11/14/23 1649 11/15/23 0320  WBC 12.4* 11.8*  NEUTROABS 10.6*  --   HGB 13.2 11.5*  HCT 41.9 35.7*  MCV 91.9 90.4  PLT 361 331   Basic Metabolic Panel: Recent Labs  Lab 11/14/23 1649 11/15/23 0320 11/16/23 0624  NA 135 136 141  K 4.5 4.1 4.5  CL 101 106 111  CO2 19* 21* 17*  GLUCOSE 147* 125* 89  BUN 83* 79* 61*  CREATININE 2.79* 2.49* 1.75*  CALCIUM 9.5 8.6* 8.4*     No results found for this or any previous visit (from the past 240 hours).   Radiology Studies: DG C-Arm 1-60 Min-No Report Result Date: 11/16/2023 Fluoroscopy was utilized by the requesting physician.  No radiographic interpretation.   DG C-Arm 1-60 Min-No  Report Result Date: 11/16/2023 Fluoroscopy was utilized by the requesting physician.  No radiographic interpretation.   CT THORACIC SPINE WO CONTRAST Result Date: 11/15/2023 CLINICAL DATA:  Back pain. Lower extremity weakness. Question cauda acquired syndrome. EXAM: CT MYELOGRAPHY LUMBAR SPINE TECHNIQUE: CT imaging of the thoracic and lumbar spine was performed after Isovue 300M contrast administration. Multiplanar CT image reconstructions were also generated. RADIATION DOSE REDUCTION: This exam was performed according to the departmental dose-optimization program which includes automated exposure control, adjustment of the mA and/or kV according to patient size and/or use of iterative reconstruction technique. COMPARISON:  CT of the thoracic and lumbar spine 11/14/2023. FINDINGS: Thoracic spine Alignment: No significant listhesis is present. Mild rightward curvature is centered at T6. Mild straightening of the normal thoracic kyphosis is stable. Vertebrae: The superior endplate compression fracture at L1 is more severe on the left. Slight retropulsed bone is present without focal stenosis. The superior endplate fracture at T5 demonstrates 30-40% loss of height. No retropulsed bone is present. This may be subacute. Superior endplate fracture at T7 is  more acute, with 40-50% loss of height. Slight retropulsed bone is present without significant stenosis. Progressive collapse is present at this level. Sclerotic lesions are present on the left at T2 and T3 extending into the left pedicle at T3. Conus medullaris: Extends to the  level and appears normal. Paraspinal and other soft tissues: The paraspinous soft tissues are within normal limits. The visualized lung fields are clear. Pacing wires are in place. Severe right-sided hydronephrosis is again noted. Disc levels: No significant central canal stenosis or cord compression is present. Mild right foraminal narrowing present at T8-9, T9-10 and T10-11. Mild left  foraminal narrowing present at T5-6 and T6-7. Lumbar spine Segmentation: 5 non rib-bearing lumbar type vertebral bodies are present. The lowest fully formed vertebral body is L5. Alignment: No significant listhesis is present. Levoconvex curvature is centered at L1-2. Mild straightening of the normal lumbar lordosis is present. Vertebrae: Vertebral body heights are normal. No acute or healing fractures are present. Conus medullaris: Extends to the T12-L1   Level and appears normal. Paraspinal and other soft tissues: Atherosclerotic calcifications are present the aorta and branch vessels. No adenopathy is present. Severe right-sided hydronephrosis is again noted. Disc levels: L1-2: Normal disc signal and height is present. No focal protrusion or stenosis is present. L2-3: Normal disc signal and height is present. No focal protrusion or stenosis is present. L3-4: A mild broad-based disc bulge is present. Mild facet hypertrophy is noted bilaterally. Disc material extends into the foramina bilaterally. Mild right foraminal narrowing is present. L4-5: A leftward disc protrusion is present. Moderate left subarticular and foraminal narrowing is present. Mild right foraminal narrowing is present. L5-S1: Chronic loss of disc height is present. A leftward disc protrusion likely contacts the traversing left S1 nerve roots. Facet spurring contributes to mild foraminal narrowing bilaterally, left greater than right. IMPRESSION: 1. No significant cord compression or central canal stenosis to explain the patient's symptoms. 2. Acute superior endplate compression fracture at T7 with 40-50% loss of height. Slight retropulsed bone is present without significant stenosis. 3. Superior endplate compression fracture at T5 with 30-40% loss of height. No retropulsed bone is present. This may be subacute. 4. Superior endplate compression fracture at L1 is more severe on the left. Slight retropulsed bone is present without focal stenosis. 5.  Sclerotic lesions on the left at T2 and T3 extending into the left pedicle at T3. These are concerning for metastatic disease. 6. Mild right foraminal narrowing at T8-9, T9-10 and T10-11. 7. Mild left foraminal narrowing at T5-6 and T6-7. 8. Moderate left subarticular and foraminal narrowing at L4-5. 9. Mild foraminal narrowing bilaterally at L5-S1, left greater than right. 10. Severe right-sided hydronephrosis is again noted. 11.  Aortic Atherosclerosis (ICD10-I70.0). These results were called by telephone at the time of interpretation on 11/15/2023 at 1:50 pm to Dr. Coletta Memos, Who verbally acknowledged these results. Electronically Signed   By: Marin Roberts M.D.   On: 11/15/2023 15:31   CT LUMBAR SPINE WO CONTRAST Result Date: 11/15/2023 CLINICAL DATA:  Back pain. Lower extremity weakness. Question cauda acquired syndrome. EXAM: CT MYELOGRAPHY LUMBAR SPINE TECHNIQUE: CT imaging of the thoracic and lumbar spine was performed after Isovue 300M contrast administration. Multiplanar CT image reconstructions were also generated. RADIATION DOSE REDUCTION: This exam was performed according to the departmental dose-optimization program which includes automated exposure control, adjustment of the mA and/or kV according to patient size and/or use of iterative reconstruction technique. COMPARISON:  CT of the thoracic and lumbar spine 11/14/2023.  FINDINGS: Thoracic spine Alignment: No significant listhesis is present. Mild rightward curvature is centered at T6. Mild straightening of the normal thoracic kyphosis is stable. Vertebrae: The superior endplate compression fracture at L1 is more severe on the left. Slight retropulsed bone is present without focal stenosis. The superior endplate fracture at T5 demonstrates 30-40% loss of height. No retropulsed bone is present. This may be subacute. Superior endplate fracture at T7 is more acute, with 40-50% loss of height. Slight retropulsed bone is present without  significant stenosis. Progressive collapse is present at this level. Sclerotic lesions are present on the left at T2 and T3 extending into the left pedicle at T3. Conus medullaris: Extends to the  level and appears normal. Paraspinal and other soft tissues: The paraspinous soft tissues are within normal limits. The visualized lung fields are clear. Pacing wires are in place. Severe right-sided hydronephrosis is again noted. Disc levels: No significant central canal stenosis or cord compression is present. Mild right foraminal narrowing present at T8-9, T9-10 and T10-11. Mild left foraminal narrowing present at T5-6 and T6-7. Lumbar spine Segmentation: 5 non rib-bearing lumbar type vertebral bodies are present. The lowest fully formed vertebral body is L5. Alignment: No significant listhesis is present. Levoconvex curvature is centered at L1-2. Mild straightening of the normal lumbar lordosis is present. Vertebrae: Vertebral body heights are normal. No acute or healing fractures are present. Conus medullaris: Extends to the T12-L1   Level and appears normal. Paraspinal and other soft tissues: Atherosclerotic calcifications are present the aorta and branch vessels. No adenopathy is present. Severe right-sided hydronephrosis is again noted. Disc levels: L1-2: Normal disc signal and height is present. No focal protrusion or stenosis is present. L2-3: Normal disc signal and height is present. No focal protrusion or stenosis is present. L3-4: A mild broad-based disc bulge is present. Mild facet hypertrophy is noted bilaterally. Disc material extends into the foramina bilaterally. Mild right foraminal narrowing is present. L4-5: A leftward disc protrusion is present. Moderate left subarticular and foraminal narrowing is present. Mild right foraminal narrowing is present. L5-S1: Chronic loss of disc height is present. A leftward disc protrusion likely contacts the traversing left S1 nerve roots. Facet spurring contributes  to mild foraminal narrowing bilaterally, left greater than right. IMPRESSION: 1. No significant cord compression or central canal stenosis to explain the patient's symptoms. 2. Acute superior endplate compression fracture at T7 with 40-50% loss of height. Slight retropulsed bone is present without significant stenosis. 3. Superior endplate compression fracture at T5 with 30-40% loss of height. No retropulsed bone is present. This may be subacute. 4. Superior endplate compression fracture at L1 is more severe on the left. Slight retropulsed bone is present without focal stenosis. 5. Sclerotic lesions on the left at T2 and T3 extending into the left pedicle at T3. These are concerning for metastatic disease. 6. Mild right foraminal narrowing at T8-9, T9-10 and T10-11. 7. Mild left foraminal narrowing at T5-6 and T6-7. 8. Moderate left subarticular and foraminal narrowing at L4-5. 9. Mild foraminal narrowing bilaterally at L5-S1, left greater than right. 10. Severe right-sided hydronephrosis is again noted. 11.  Aortic Atherosclerosis (ICD10-I70.0). These results were called by telephone at the time of interpretation on 11/15/2023 at 1:50 pm to Dr. Coletta Memos, Who verbally acknowledged these results. Electronically Signed   By: Marin Roberts M.D.   On: 11/15/2023 15:31   DG MYELOGRAPHY LUMBAR INJ MULTI REGION Result Date: 11/15/2023 CLINICAL DATA:  68 year old female with history of  back pain, lower extremity weakness. Contrast injection for CT myelogram requested. EXAM: LUMBAR MYELOGRAM FLUOROSCOPY: dictate in minutes and seconds PROCEDURE: After thorough discussion of risks and benefits of the procedure including bleeding, infection, injury to nerves, blood vessels, adjacent structures as well as headache and CSF leak, written and oral informed consent was obtained. Time out form was completed. Patient was positioned prone on the fluoroscopy table. Local anesthesia was provided with 1% lidocaine without  epinephrine after prepped and draped in the usual sterile fashion. Puncture was performed at L2-L3 using a 3 1/2 inch 20-gauge spinal needle via left approach. Using a single pass through the dura, the needle was placed within the thecal sac, with return of clear CSF. 15 mL of Omnipaque 180 was injected into the thecal sac, with normal opacification of the nerve roots and cauda equina consistent with free flow within the subarachnoid space. FINDINGS: Contiguous axial images were obtained through the Lumbar spine after the intrathecal infusion with appropriate contrast dispersal. IMPRESSION: Successful intrathecal administration of contrast for CT myelogram. Performed by: Loyce Dys PA-C Supervised by Dr. Chryl Heck, MD Electronically Signed   By: Malachy Moan M.D.   On: 11/15/2023 14:34   CT THORACIC SPINE WO CONTRAST Result Date: 11/14/2023 CLINICAL DATA:  Mid-back pain, abnormal neuro, positive xray (Ped 0-17y); Low back pain, cauda equina syndrome suspected EXAM: CT THORACIC AND LUMBAR SPINE WITHOUT CONTRAST TECHNIQUE: Multidetector CT imaging of the thoracic and lumbar spine was performed without contrast. Multiplanar CT image reconstructions were also generated. RADIATION DOSE REDUCTION: This exam was performed according to the departmental dose-optimization program which includes automated exposure control, adjustment of the mA and/or kV according to patient size and/or use of iterative reconstruction technique. COMPARISON:  MRI lumbar spine 12/12/2008, CT chest 10/17/2023: CT abdomen pelvis 10/28/2023 FINDINGS: CT THORACIC SPINE FINDINGS Alignment: Normal. Vertebrae: Diffusely decreased bone density. Interval development of an age-indeterminate T1 compression fracture with at least 40% vertebral body height loss. Chronic stable T5, T7 compression fracture. Redemonstration of scattered sclerotic axial skeleton lesions. Multilevel mild degenerative changes of the spine. Paraspinal and other  soft tissues: Negative. Disc levels: Maintained. CT LUMBAR SPINE FINDINGS Segmentation: 5 lumbar type vertebrae. Alignment: Normal. Vertebrae: Diffusely decreased bone density. No severe osseous neural foraminal or central canal stenosis. L3-L4, L4-L5, L5-S1 disc bulge with question at least moderate central canal stenosis at the L4-L5 level. No acute fracture or focal pathologic process. Paraspinal and other soft tissues: Negative. Disc levels: Maintained. Other:. Diffuse bronchial wall thickening. Limited evaluation of the lungs due to motion artifact. Severe right hydroureteronephrosis that is partially visualized. Atherosclerotic plaque. Cardiac leads. Status post cholecystectomy. Acute minimally displaced right posterior first rib fracture (5:21). IMPRESSION: IMPRESSION CT THORACIC SPINE IMPRESSION 1. No acute displaced fracture or traumatic listhesis of the thoracic spine. 2. Redemonstration of scattered sclerotic axial skeleton lesions. Question metastases. CT LUMBAR SPINE IMPRESSION 1. No acute displaced fracture or traumatic listhesis of the lumbar spine. 2. L3-L4, L4-L5, L5-S1 disc bulge with question at least moderate central canal stenosis at the L4-L5 level. Recommend MRI for further evaluation. Other imaging findings of potential clinical significance: 1. Acute minimally displaced right posterior first rib fracture 2. Severe right hydroureteronephrosis partially visualized. Finding better evaluated on CT abdomen pelvis 10/28/2023 3.  Aortic Atherosclerosis (ICD10-I70.0). Electronically Signed   By: Tish Frederickson M.D.   On: 11/14/2023 20:53   CT LUMBAR SPINE WO CONTRAST Result Date: 11/14/2023 CLINICAL DATA:  Mid-back pain, abnormal neuro, positive xray (Ped 0-17y); Low back  pain, cauda equina syndrome suspected EXAM: CT THORACIC AND LUMBAR SPINE WITHOUT CONTRAST TECHNIQUE: Multidetector CT imaging of the thoracic and lumbar spine was performed without contrast. Multiplanar CT image reconstructions  were also generated. RADIATION DOSE REDUCTION: This exam was performed according to the departmental dose-optimization program which includes automated exposure control, adjustment of the mA and/or kV according to patient size and/or use of iterative reconstruction technique. COMPARISON:  MRI lumbar spine 12/12/2008, CT chest 10/17/2023: CT abdomen pelvis 10/28/2023 FINDINGS: CT THORACIC SPINE FINDINGS Alignment: Normal. Vertebrae: Diffusely decreased bone density. Interval development of an age-indeterminate T1 compression fracture with at least 40% vertebral body height loss. Chronic stable T5, T7 compression fracture. Redemonstration of scattered sclerotic axial skeleton lesions. Multilevel mild degenerative changes of the spine. Paraspinal and other soft tissues: Negative. Disc levels: Maintained. CT LUMBAR SPINE FINDINGS Segmentation: 5 lumbar type vertebrae. Alignment: Normal. Vertebrae: Diffusely decreased bone density. No severe osseous neural foraminal or central canal stenosis. L3-L4, L4-L5, L5-S1 disc bulge with question at least moderate central canal stenosis at the L4-L5 level. No acute fracture or focal pathologic process. Paraspinal and other soft tissues: Negative. Disc levels: Maintained. Other:. Diffuse bronchial wall thickening. Limited evaluation of the lungs due to motion artifact. Severe right hydroureteronephrosis that is partially visualized. Atherosclerotic plaque. Cardiac leads. Status post cholecystectomy. Acute minimally displaced right posterior first rib fracture (5:21). IMPRESSION: IMPRESSION CT THORACIC SPINE IMPRESSION 1. No acute displaced fracture or traumatic listhesis of the thoracic spine. 2. Redemonstration of scattered sclerotic axial skeleton lesions. Question metastases. CT LUMBAR SPINE IMPRESSION 1. No acute displaced fracture or traumatic listhesis of the lumbar spine. 2. L3-L4, L4-L5, L5-S1 disc bulge with question at least moderate central canal stenosis at the L4-L5  level. Recommend MRI for further evaluation. Other imaging findings of potential clinical significance: 1. Acute minimally displaced right posterior first rib fracture 2. Severe right hydroureteronephrosis partially visualized. Finding better evaluated on CT abdomen pelvis 10/28/2023 3.  Aortic Atherosclerosis (ICD10-I70.0). Electronically Signed   By: Tish Frederickson M.D.   On: 11/14/2023 20:53    Scheduled Meds:  [XBJ Hold] Chlorhexidine Gluconate Cloth  6 each Topical Daily   [MAR Hold] dexamethasone (DECADRON) injection  6 mg Intravenous Q24H   [MAR Hold] fluticasone furoate-vilanterol  1 puff Inhalation Daily   [MAR Hold] lidocaine  1 patch Transdermal Q24H   [MAR Hold] polyethylene glycol  17 g Oral Daily   [MAR Hold] senna-docusate  2 tablet Oral BID   [MAR Hold] sodium bicarbonate  650 mg Oral BID   [MAR Hold] sodium chloride flush  3 mL Intravenous Q12H   Continuous Infusions:  sodium chloride 75 mL/hr at 11/16/23 0001   ceFAZolin     [MAR Hold] cefTRIAXone (ROCEPHIN)  IV 1 g (11/15/23 1959)     LOS: 2 days   Burnadette Pop, MD [facility] P3/23/2025, 11:58 AM

## 2023-11-16 NOTE — Consult Note (Signed)
 Chief Complaint: Bladder cancer with ureteral obstruction  Referring Provider(s): Rhoderick Moody  Supervising Physician: Malachy Moan  Patient Status: [facility] - In-pt  History of Present Illness: [name] is a 68 y.o. female who is known to our service for myelogram done yesterday by Loyce Dys, PA-C for sudden onset of paraplegia.  Medical issues include COPD, bladder cancer, and spinal stenosis.  CT done March 4 at outside facility was concerning for a bladder mass.   Urology performed cystoscopy today and was unable to place a stent due to ureteral obstruction.  IR is asked to evaluate for placement of a percutaneous nephrostomy.  She is NPO.  Patient is Full Code  Past Medical History:  Diagnosis Date   Abdominal pain 10/17/2020   Abnormality of gait due to impairment of balance 11/22/2020   Acute diverticulitis 10/17/2020   Admission for long-term opiate analgesic use 10/24/2019   Arthritis of right hip 05/29/2016   Formatting of this note might be different from the original. Added automatically from request for surgery [id number]   BMI 26.0-26.9,adult 03/29/2020   Bronchial asthma    Cardiomyopathy (HCC)    Overview:  Ejection fraction 45% in 2015 Ejection fraction 30 to 35% in November 2018   Chronic back pain    Chronic constipation 07/31/2021   Chronic hypoxemic respiratory failure (HCC) 10/24/2019   Chronic narcotic use 03/23/2015   Chronic pain of both knees 12/21/2018   Added automatically from request for surgery [id number]  Formatting of this note might be different from the original. 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   Hypotension 05/17/2020   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   Ileus following gastrointestinal surgery (HCC) 03/26/2015   Major depressive disorder, single episode, moderate (HCC) 10/24/2019   Malnutrition of moderate degree (HCC) 10/20/2020   Mesenteric ischemia (HCC) 10/16/2020   Migraine without aura with status migrainosus 10/24/2019   Mixed incontinence 10/20/2020   Myocardial infarction (HCC)    Nausea & vomiting    Obstructive chronic bronchitis with exacerbation (HCC) 10/25/2019   Other spondylosis with radiculopathy, lumbar region 10/24/2019   Persistent vomiting 05/17/2020   Presence of left artificial hip joint 10/24/2019   PTSD (post-traumatic stress disorder)    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 03/23/2015 04/19/2014   Senile osteoporosis 10/24/2019   Sepsis (HCC) 10/17/2020   Serosanguineous chronic otitis media of right ear 08/02/2021   Small bowel obstruction (HCC)    Thyroid disease    Upper respiratory tract infection due to COVID-19 virus 07/06/2021   Wellness examination 03/27/2021    Past Surgical History:  Procedure Laterality Date   APPENDECTOMY     CARDIAC CATHETERIZATION     CARPAL TUNNEL RELEASE     CESAREAN SECTION     CHF s/p  AICD   12/2010   CHOLECYSTECTOMY     COLONOSCOPY  10/16/2015   Mild colonic diverticulosis, predominantly in the left colon. Small internal hemrrhoids. Otherwise normal colonoscopy   COLONOSCOPY WITH PROPOFOL N/A 09/27/2021   Procedure:  COLONOSCOPY WITH PROPOFOL;  Surgeon: Napoleon Form, MD;  Location: [facility];  Service: Endoscopy;  Laterality: N/A;   CORONARY ANGIOPLASTY     ESOPHAGOGASTRODUODENOSCOPY  02/04/2014   Mild gastritis. Otherwise normal EGD   HAND SURGERY Bilateral    ICD GENERATOR CHANGEOUT N/A 05/21/2019   Procedure: ICD GENERATOR CHANGEOUT;  Surgeon: Regan Lemming, MD;  Location: [facility] INVASIVE CV LAB;  Service: Cardiovascular;  Laterality: N/A;   ICD IMPLANT     Medtronic   intestinal blockage 2011     LAPAROSCOPIC ASSISTED VENTRAL HERNIA REPAIR N/A 03/23/2015   Procedure: LAPAROSCOPIC VENTRAL WALL HERNIA REPAIR;  Surgeon: Karie Soda, MD;  Location: [facility] ORS;  Service: General;  Laterality: N/A;  With MESH   LAPAROSCOPIC LYSIS OF ADHESIONS N/A 03/23/2015   Procedure: LAPAROSCOPIC LYSIS OF ADHESIONS;  Surgeon: Karie Soda, MD;  Location: [facility]S;  Service: General;  Laterality: N/A;   LSCS      x2   NASAL SEPTUM SURGERY     NECK SURGERY     fused   TONSILLECTOMY     TYMPANOSTOMY TUBE PLACEMENT Right 2024   ULNAR NERVE TRANSPOSITION  01/23/2012   Procedure: ULNAR NERVE DECOMPRESSION/TRANSPOSITION;  Surgeon: Cristi Loron, MD;  Location: [facility] NEURO ORS;  Service: Neurosurgery;  Laterality: Left;  LEFT ulnar nerve decompression    Allergies: Patient has no known allergies.  Medications: Prior to Admission medications   Medication Sig Start Date End Date Taking? Authorizing Provider  ARIPiprazole (ABILIFY) 5 MG tablet TAKE 1 TABLET BY MOUTH DAILY 10/07/23  Yes Cox, Kirsten, MD  aspirin 81 MG tablet Take 81 mg by mouth daily.   Yes [provider]  BREO ELLIPTA 100-25 MCG/ACT AEPB INHALE 1 PUFF INTO THE LUNGS DAILY. Patient taking differently: Inhale 1 puff into the lungs daily as needed. 06/03/23  Yes Craft, Huston Foley, PA  Buprenorphine HCl (BELBUCA) 900 MCG FILM Place 900 mcg inside cheek every 12 (twelve) hours.   Yes [provider]  carvedilol (COREG) 3.125 MG tablet  TAKE ONE (1) TABLET BY MOUTH TWICE DAILY WITH MEALS Patient taking differently: Take 3.125 mg by mouth 2 (two) times daily with a meal. 06/02/23  Yes Cox, Kirsten, MD  cefdinir (OMNICEF) 300 MG capsule Take 300 mg by mouth 2 (two) times daily. 11/10/23  Yes [provider]  denosumab (PROLIA) 60 MG/ML SOSY injection Inject 60 mg into the skin every 6 (six) months. 02/14/20  Yes Abigail Miyamoto, MD  diclofenac Sodium (VOLTAREN) 1 % GEL Apply 2 g topically 4 (four) times daily. Patient taking differently: Apply 2 g topically as needed (Pain). 05/01/21  Yes Abigail Miyamoto, MD  EPINEPHRINE 0.3 mg/0.3 mL IJ SOAJ injection Inject 0.3 mLs (0.3 mg total) into the muscle as needed for anaphylaxis. 04/27/20  Yes Abigail Miyamoto, MD  esomeprazole (NEXIUM) 20 MG capsule TAKE ONE (1) CAPSULE BY MOUTH ONCE DAILY 08/28/23  Yes Cox, Kirsten, MD  FARXIGA 5 MG TABS tablet Take 5 mg by mouth daily. 10/10/23  Yes [provider]  fluticasone (FLONASE) 50 MCG/ACT nasal spray USE 2 SPRAYS IN EACH NOSTRILS DAILY AS NEEDED Patient taking differently: Place 2 sprays into both nostrils daily as needed for allergies or rhinitis. 07/18/23  Yes Lajuana Matte  M, FNP  furosemide (LASIX) 40 MG tablet Take 1 tablet (40 mg total) by mouth daily. 06/25/23 11/14/23 Yes Georgeanna Lea, MD  gabapentin (NEURONTIN) 800 MG tablet Take 1 tablet (800 mg total) by mouth 3 (three) times daily. Patient usually takes twice a day 01/21/20  Yes Abigail Miyamoto, MD  Galcanezumab-gnlm [facility]) 120 MG/ML SOSY Inject 120 mg into the skin every 30 (thirty) days. 01/06/23  Yes Cox, Kirsten, MD  ipratropium-albuterol (DUONEB) 0.5-2.5 (3) MG/3ML SOLN USE 1 VIAL VIA NEBULIZER EVERY 6 HOURS AS NEEDED FOR SHORTNESS OF BREATH 10/16/23  Yes [name], FNP  levocetirizine (XYZAL) 5 MG tablet Take 5 mg by mouth daily as needed for allergies. 12/13/19  Yes [provider]  lubiprostone (AMITIZA) 24 MCG capsule  TAKE 1 CAPSULE(24 MCG) BY MOUTH TWICE DAILY WITH A MEAL 09/15/23  Yes Cox, Kirsten, MD  megestrol (MEGACE) 20 MG tablet Take 1 tablet (20 mg total) by mouth daily. 06/19/23  Yes Cox, Kirsten, MD  Multiple Vitamin (MULTIVITAMIN WITH MINERALS) TABS tablet Take 1 tablet by mouth daily. 10/30/20  Yes Rodolph Bong, MD  naloxone [facility]) nasal spray 4 mg/0.1 mL Place 1 spray into the nose once. 01/12/21  Yes [provider]  nitrofurantoin, macrocrystal-monohydrate, (MACROBID) 100 MG capsule Take 100 mg by mouth 2 (two) times daily. 11/10/23  Yes [provider]  nitroGLYCERIN (NITROSTAT) 0.4 MG SL tablet Place 1 tablet (0.4 mg total) under the tongue every 5 (five) minutes as needed for chest pain. 11/27/21  Yes Georgeanna Lea, MD  oxyCODONE (ROXICODONE) 15 MG immediate release tablet Take 15 mg by mouth every 8 (eight) hours as needed for pain. 08/29/21  Yes [provider]  potassium chloride (KLOR-CON) 10 MEQ tablet Take 3 tablets (30 mEq total) by mouth daily for 7 days. 10/29/23 11/14/23 Yes Camnitz, Will Daphine Deutscher, MD  ranolazine (RANEXA) 500 MG 12 hr tablet TAKE 1 TABLET BY MOUTH TWICE DAILY 10/28/23  Yes Georgeanna Lea, MD  rosuvastatin (CRESTOR) 20 MG tablet TAKE 1 TABLET BY MOUTH ONCE DAILY 08/28/23  Yes Cox, Kirsten, MD  sacubitril-valsartan (ENTRESTO) 24-26 MG TAKE ONE (1) TABLET BY MOUTH TWICE DAILY Patient taking differently: Take 1 tablet by mouth 2 (two) times daily. 06/02/23  Yes Cox, Kirsten, MD  sertraline (ZOLOFT) 100 MG tablet TAKE 1/2 TABLET(50 MG) BY MOUTH DAILY 08/24/23  Yes Cox, Kirsten, MD  tiZANidine (ZANAFLEX) 4 MG tablet Take 4 mg by mouth at bedtime. 11/09/20  Yes [provider]  topiramate (TOPAMAX) 50 MG tablet TAKE TWO (2) TABLETS BY MOUTH EVERY DAY AT BEDTIME Patient taking differently: Take 100 mg by mouth at bedtime. TAKE TWO (2) TABLETS BY MOUTH EVERY DAY AT BEDTIME 06/02/23  Yes Cox, Kirsten, MD  Ubrogepant (UBRELVY) 100 MG TABS Take 1  tablet (100 mg total) by mouth as needed (May repeat after 2 hours.  Maximum 2 tablets in 24 hours). TAKE 1 TABLET BY MOUTH BY MOUTH AS NEEDED( MAY REPEAT 1 TABLET AFTER 2 HOURS IF NEEDED, MAXIMUM 2 TABLETS IN 24 HOURS) Strength: 100 mg 04/07/23  Yes Cox, Kirsten, MD  Vitamin D, Ergocalciferol, (DRISDOL) 1.25 MG (50000 UNIT) CAPS capsule TAKE 1 CAPSULE BY MOUTH EVERY 7 DAYS FOR 12 DOSES Patient taking differently: Take 50,000 Units by mouth every 7 (seven) days. Patient usually takes this medication on mondays 06/30/23  Yes Cox, Kirsten, MD  dextromethorphan 15 MG/5ML syrup Take 10 mLs (30 mg total) by mouth 4 (four) times daily as  needed for cough. Patient not taking: Reported on 11/14/2023 10/22/23   [name], FNP  guaiFENesin-codeine 100-10 MG/5ML syrup Take 5 mLs by mouth 3 (three) times daily as needed for cough. 10/16/23   [name], FNP  ondansetron (ZOFRAN) 4 MG tablet Take 1 tablet (4 mg total) by mouth every 8 (eight) hours as needed for nausea or vomiting. 05/17/20   Abigail Miyamoto, MD     Family History  Problem Relation Age of Onset   Hypertension Mother    Heart attack Mother    Alcohol abuse Father    Hypertension Father    Heart attack Father    Heart attack Other    Cancer Other    Heart failure Other    Anesthesia problems Neg Hx    Hypotension Neg Hx    Malignant hyperthermia Neg Hx    Pseudochol deficiency Neg Hx    Breast cancer Neg Hx     Social History   Socioeconomic History   Marital status: Single    Spouse name: Not on file   Number of children: 3   Years of education: Not on file   Highest education level: 10th grade  Occupational History   Occupation: disabled  Tobacco Use   Smoking status: Former    Current packs/day: 0.00    Average packs/day: 0.5 packs/day for 30.0 years (15.0 ttl pk-yrs)    Types: Cigarettes    Start date: 03/1990    Quit date: 03/2020    Years since quitting: 3.6   Smokeless tobacco: Never  Vaping Use    Vaping status: Never Used  Substance and Sexual Activity   Alcohol use: Yes    Alcohol/week: 2.0 standard drinks of alcohol    Types: 1 Cans of beer, 1 Standard drinks or equivalent per week    Comment: seldom   Drug use: No   Sexual activity: Not Currently  Other Topics Concern   Not on file  Social History Narrative   One level home with boyfriend   Caffeine - coffee 1-2 cups/day; Green tea 4-5 bottles a day   Exercise - some    Right handed      Social Drivers of Health   Financial Resource Strain: Low Risk  (04/05/2023)   Overall Financial Resource Strain (CARDIA)    Difficulty of Paying Living Expenses: Not very hard  Food Insecurity: No Food Insecurity (11/15/2023)   Hunger Vital Sign    Worried About Running Out of Food in the Last Year: Never true    Ran Out of Food in the Last Year: Never true  Transportation Needs: Unmet Transportation Needs (11/15/2023)   PRAPARE - Administrator, Civil Service (Medical): Yes    Lack of Transportation (Non-Medical): No  Physical Activity: Insufficiently Active (04/05/2023)   Exercise Vital Sign    Days of Exercise per Week: 3 days    Minutes of Exercise per Session: 20 min  Stress: Stress Concern Present (04/05/2023)   [employer] of Occupational Health - Occupational Stress Questionnaire    Feeling of Stress : Very much  Social Connections: Moderately Isolated (11/15/2023)   Social Connection and Isolation Panel [NHANES]    Frequency of Communication with Friends and Family: More than three times a week    Frequency of Social Gatherings with Friends and Family: More than three times a week    Attends Religious Services: 1 to 4 times per year    Active Member of Clubs or  Organizations: No    Attends Banker Meetings: Never    Marital Status: Divorced     Review of Systems: A 12 point ROS discussed and pertinent positives are indicated in the HPI above.  All other systems are negative.  Review of  Systems  Unable to perform ROS: Other  Groggy from anesthesia  Vital Signs: BP 135/80 (BP Location: Left Arm)   Pulse 69   Temp 98.6 F (37 C) (Axillary)   Resp 14   Ht 4\' 10"  (1.473 m)   Wt 161 lb 6 oz (73.2 kg)   SpO2 99%   BMI 33.73 kg/m   Advance Care Plan: The advanced care place/surrogate decision maker was discussed at the time of visit and the patient did not wish to discuss or was not able to name a surrogate decision maker or provide an advance care plan.  Physical Exam Vitals reviewed.  Constitutional:      Appearance: Normal appearance.  Cardiovascular:     Rate and Rhythm: Normal rate and regular rhythm.  Pulmonary:     Effort: Pulmonary effort is normal. No respiratory distress.     Breath sounds: Normal breath sounds.  Abdominal:     Palpations: Abdomen is soft.  Musculoskeletal:        General: Normal range of motion.  Neurological:     General: No focal deficit present.     Mental Status: She is alert.  Psychiatric:        Mood and Affect: Mood normal.        Behavior: Behavior normal.     Imaging: CLINICAL DATA:  Back pain. Lower extremity weakness. Question cauda acquired syndrome.   EXAM: CT MYELOGRAPHY LUMBAR SPINE   TECHNIQUE: CT imaging of the thoracic and lumbar spine was performed after Isovue 300M contrast administration. Multiplanar CT image reconstructions were also generated.   RADIATION DOSE REDUCTION: This exam was performed according to the departmental dose-optimization program which includes automated exposure control, adjustment of the mA and/or kV according to patient size and/or use of iterative reconstruction technique.   COMPARISON:  CT of the thoracic and lumbar spine 11/14/2023.   FINDINGS: Thoracic spine   Alignment: No significant listhesis is present. Mild rightward curvature is centered at T6. Mild straightening of the normal thoracic kyphosis is stable.   Vertebrae: The superior endplate compression  fracture at L1 is more severe on the left. Slight retropulsed bone is present without focal stenosis.   The superior endplate fracture at T5 demonstrates 30-40% loss of height. No retropulsed bone is present. This may be subacute.   Superior endplate fracture at T7 is more acute, with 40-50% loss of height. Slight retropulsed bone is present without significant stenosis. Progressive collapse is present at this level.   Sclerotic lesions are present on the left at T2 and T3 extending into the left pedicle at T3.   Conus medullaris: Extends to the  level and appears normal.   Paraspinal and other soft tissues: The paraspinous soft tissues are within normal limits. The visualized lung fields are clear. Pacing wires are in place. Severe right-sided hydronephrosis is again noted.   Disc levels:   No significant central canal stenosis or cord compression is present. Mild right foraminal narrowing present at T8-9, T9-10 and T10-11. Mild left foraminal narrowing present at T5-6 and T6-7.   Lumbar spine   Segmentation: 5 non rib-bearing lumbar type vertebral bodies are present. The lowest fully formed vertebral body is L5.   Alignment:  No significant listhesis is present. Levoconvex curvature is centered at L1-2. Mild straightening of the normal lumbar lordosis is present.   Vertebrae: Vertebral body heights are normal. No acute or healing fractures are present.   Conus medullaris: Extends to the T12-L1   Level and appears normal.   Paraspinal and other soft tissues: Atherosclerotic calcifications are present the aorta and branch vessels. No adenopathy is present. Severe right-sided hydronephrosis is again noted.   Disc levels:   L1-2: Normal disc signal and height is present. No focal protrusion or stenosis is present.   L2-3: Normal disc signal and height is present. No focal protrusion or stenosis is present.   L3-4: A mild broad-based disc bulge is present. Mild  facet hypertrophy is noted bilaterally. Disc material extends into the foramina bilaterally. Mild right foraminal narrowing is present.   L4-5: A leftward disc protrusion is present. Moderate left subarticular and foraminal narrowing is present. Mild right foraminal narrowing is present.   L5-S1: Chronic loss of disc height is present. A leftward disc protrusion likely contacts the traversing left S1 nerve roots. Facet spurring contributes to mild foraminal narrowing bilaterally, left greater than right.   IMPRESSION: 1. No significant cord compression or central canal stenosis to explain the patient's symptoms. 2. Acute superior endplate compression fracture at T7 with 40-50% loss of height. Slight retropulsed bone is present without significant stenosis. 3. Superior endplate compression fracture at T5 with 30-40% loss of height. No retropulsed bone is present. This may be subacute. 4. Superior endplate compression fracture at L1 is more severe on the left. Slight retropulsed bone is present without focal stenosis. 5. Sclerotic lesions on the left at T2 and T3 extending into the left pedicle at T3. These are concerning for metastatic disease. 6. Mild right foraminal narrowing at T8-9, T9-10 and T10-11. 7. Mild left foraminal narrowing at T5-6 and T6-7. 8. Moderate left subarticular and foraminal narrowing at L4-5. 9. Mild foraminal narrowing bilaterally at L5-S1, left greater than right. 10. Severe right-sided hydronephrosis is again noted. 11.  Aortic Atherosclerosis (ICD10-I70.0).   These results were called by telephone at the time of interpretation on 11/15/2023 at 1:50 pm to Dr. Coletta Memos, Who verbally acknowledged these results.     Electronically Signed   By: Marin Roberts M.D.   On: 11/15/2023 15:31  Labs:  CBC: Recent Labs    06/23/23 1516 09/25/23 1449 11/14/23 1649 11/15/23 0320  WBC 8.5 9.6 12.4* 11.8*  HGB 10.5* 12.7 13.2 11.5*  HCT 33.1*  37.5 41.9 35.7*  PLT 228 314 361 331    COAGS: Recent Labs    11/15/23 0320  INR 1.1  APTT 26    BMP: Recent Labs    09/25/23 1449 11/14/23 1649 11/15/23 0320 11/16/23 0624  NA 139 135 136 141  K 4.6 4.5 4.1 4.5  CL 108* 101 106 111  CO2 16* 19* 21* 17*  GLUCOSE 131* 147* 125* 89  BUN 32* 83* 79* 61*  CALCIUM 9.9 9.5 8.6* 8.4*  CREATININE 1.76* 2.79* 2.49* 1.75*  GFRNONAA  --  18* 20* 31*    LIVER FUNCTION TESTS: Recent Labs    06/02/23 1701 06/23/23 1516 09/25/23 1449 11/14/23 1649  BILITOT 0.2 0.3 0.3 0.5  AST 12 13 12 24   ALT 4 5 7 15   ALKPHOS 67 63 78 72  PROT 6.7 6.6 6.5 7.6  ALBUMIN 4.4 4.2 4.3 3.6    TUMOR MARKERS: No results for input(s): "AFPTM", "CEA", "CA199", "CHROMGRNA" in  the last 8760 hours.  Assessment and Plan:  Right hydronephrosis due to bladder mass.  Will proceed with image guided placement of a percutaneous nephrostomy catheter on the right today by Dr. Archer Asa.  Risks and benefits of right PCN placement was discussed with the patient and her significant other [name]) including, but not limited to, infection, bleeding, significant bleeding causing loss or decrease in renal function or damage to adjacent structures.   All questions were answered, patient is agreeable to proceed.  Consent signed and in chart.  Thank you for allowing our service to participate in [name] 's care.  Electronically Signed: Gwynneth Macleod, PA-C   11/16/2023, 1:07 PM      I spent a total of 40 Minutes    in face to face in clinical consultation, greater than 50% of which was counseling/coordinating care for PCN placement.  (A copy of this note was sent to the referring provider and the time of visit.)

## 2023-11-16 NOTE — Anesthesia Procedure Notes (Signed)
 Procedure Name: Intubation Date/Time: 11/16/2023 10:20 AM  Performed by: [place], Clydene Pugh, CRNAPre-anesthesia Checklist: Patient identified, Emergency Drugs available, Suction available and Patient being monitored Patient Re-evaluated:Patient Re-evaluated prior to induction Oxygen Delivery Method: Circle system utilized Preoxygenation: Pre-oxygenation with 100% oxygen Induction Type: IV induction Ventilation: Mask ventilation without difficulty Laryngoscope Size: Mac and 3 Grade View: Grade I Tube type: Oral Number of attempts: 1 Airway Equipment and Method: Stylet and Oral airway Placement Confirmation: ETT inserted through vocal cords under direct vision, positive ETCO2 and breath sounds checked- equal and bilateral Secured at: 21 cm Tube secured with: Tape Dental Injury: Teeth and Oropharynx as per pre-operative assessment

## 2023-11-16 NOTE — Progress Notes (Signed)
 PT Cancellation Note  Patient Details Name: [name] MRN: [id number] DOB: 02-Mar-1956   Cancelled Treatment:    Reason Eval/Treat Not Completed: Other (comment);Patient not medically ready   Pt recently back to floor from cystoscopy this morning, multiple other consults pending.  Will continue efforts to complete PT eval   Delice Bison, PT  Acute Rehab Dept [facility]) [phone]  11/16/2023

## 2023-11-16 NOTE — Progress Notes (Signed)
 Report given to Nia, Charity fundraiser at [employer]. [facility] here to transport pt.

## 2023-11-16 NOTE — Anesthesia Preprocedure Evaluation (Signed)
 Anesthesia Evaluation  Patient identified by MRN, date of birth, ID band Patient awake    Reviewed: Allergy & Precautions, H&P , NPO status , Patient's Chart, lab work & pertinent test results  Airway Mallampati: II   Neck ROM: full    Dental   Pulmonary asthma , COPD, former smoker   breath sounds clear to auscultation       Cardiovascular + Past MI and +CHF  + Cardiac Defibrillator  Rhythm:regular Rate:Normal  TTE (05/2023): EF 55-60%  EF has been as low as 30% in the past.   Neuro/Psych  Headaches PSYCHIATRIC DISORDERS Anxiety Depression     Neuromuscular disease    GI/Hepatic ,GERD  ,,  Endo/Other    Renal/GU Renal InsufficiencyRenal disease   Bladder mass obstructing right ureter    Musculoskeletal  (+) Arthritis ,  Herniated L4/5 disc.  Patient has bilateral LE weakness undergoing workup.  Neurosurgery states unrelated to L4/5 disc pathology.   Abdominal   Peds  Hematology   Anesthesia Other Findings   Reproductive/Obstetrics                             Anesthesia Physical Anesthesia Plan  ASA: 4  Anesthesia Plan: General   Post-op Pain Management:    Induction: Intravenous  PONV Risk Score and Plan: 3 and Ondansetron, Dexamethasone and Treatment may vary due to age or medical condition  Airway Management Planned: Oral ETT  Additional Equipment:   Intra-op Plan:   Post-operative Plan: Extubation in OR  Informed Consent: I have reviewed the patients History and Physical, chart, labs and discussed the procedure including the risks, benefits and alternatives for the proposed anesthesia with the patient or authorized representative who has indicated his/her understanding and acceptance.     Dental advisory given  Plan Discussed with: CRNA, Anesthesiologist and Surgeon  Anesthesia Plan Comments:        Anesthesia Quick Evaluation

## 2023-11-17 ENCOUNTER — Ambulatory Visit
Admit: 2023-11-17 | Discharge: 2023-11-17 | Disposition: A | Discharge status: Home / Self Care | Attending: Radiation Oncology | Admitting: Radiation Oncology

## 2023-11-17 ENCOUNTER — Encounter (HOSPITAL_COMMUNITY): Payer: Self-pay | Admitting: Urology

## 2023-11-17 ENCOUNTER — Inpatient Hospital Stay (HOSPITAL_COMMUNITY)

## 2023-11-17 DIAGNOSIS — N898 Other specified noninflammatory disorders of vagina: Secondary | ICD-10-CM

## 2023-11-17 DIAGNOSIS — R339 Retention of urine, unspecified: Secondary | ICD-10-CM | POA: Diagnosis not present

## 2023-11-17 DIAGNOSIS — N179 Acute kidney failure, unspecified: Secondary | ICD-10-CM | POA: Diagnosis not present

## 2023-11-17 DIAGNOSIS — G894 Chronic pain syndrome: Secondary | ICD-10-CM

## 2023-11-17 DIAGNOSIS — N3289 Other specified disorders of bladder: Secondary | ICD-10-CM | POA: Diagnosis not present

## 2023-11-17 DIAGNOSIS — N3941 Urge incontinence: Secondary | ICD-10-CM

## 2023-11-17 DIAGNOSIS — C678 Malignant neoplasm of overlapping sites of bladder: Secondary | ICD-10-CM | POA: Diagnosis not present

## 2023-11-17 DIAGNOSIS — M545 Low back pain, unspecified: Secondary | ICD-10-CM

## 2023-11-17 DIAGNOSIS — N1330 Unspecified hydronephrosis: Secondary | ICD-10-CM

## 2023-11-17 LAB — CBC
HCT: 33.5 % — ABNORMAL LOW (ref 36.0–46.0)
Hemoglobin: 10.5 g/dL — ABNORMAL LOW (ref 12.0–15.0)
MCH: 29.1 pg (ref 26.0–34.0)
MCHC: 31.3 g/dL (ref 30.0–36.0)
MCV: 92.8 fL (ref 80.0–100.0)
Platelets: 320 10*3/uL (ref 150–400)
RBC: 3.61 MIL/uL — ABNORMAL LOW (ref 3.87–5.11)
RDW: 14.3 % (ref 11.5–15.5)
WBC: 14.7 10*3/uL — ABNORMAL HIGH (ref 4.0–10.5)
nRBC: 0 % (ref 0.0–0.2)

## 2023-11-17 LAB — BASIC METABOLIC PANEL
Anion gap: 9 (ref 5–15)
BUN: 54 mg/dL — ABNORMAL HIGH (ref 8–23)
CO2: 19 mmol/L — ABNORMAL LOW (ref 22–32)
Calcium: 8.4 mg/dL — ABNORMAL LOW (ref 8.9–10.3)
Chloride: 112 mmol/L — ABNORMAL HIGH (ref 98–111)
Creatinine, Ser: 1.8 mg/dL — ABNORMAL HIGH (ref 0.44–1.00)
GFR, Estimated: 30 mL/min — ABNORMAL LOW (ref >60)
Glucose, Bld: 96 mg/dL (ref 70–99)
Potassium: 4.5 mmol/L (ref 3.5–5.1)
Sodium: 140 mmol/L (ref 135–145)

## 2023-11-17 LAB — CK: Total CK: 34 U/L — ABNORMAL LOW (ref 38–234)

## 2023-11-17 MED ORDER — BUPRENORPHINE HCL 900 MCG BU FILM
900.0000 ug | ORAL_FILM | Freq: Two times a day (BID) | BUCCAL | Status: DC
  Administered 2023-11-22: 900 ug via BUCCAL

## 2023-11-17 MED ORDER — RANOLAZINE ER 500 MG PO TB12
500.0000 mg | ORAL_TABLET | Freq: Two times a day (BID) | ORAL | Status: DC
  Administered 2023-11-17 – 2023-11-24 (×15): 500 mg via ORAL
  Filled 2023-11-17 (×16): qty 1

## 2023-11-17 MED ORDER — FLUTICASONE PROPIONATE 50 MCG/ACT NA SUSP: 2.0000 | Freq: Every day | NASAL | Status: DC | PRN

## 2023-11-17 MED ORDER — ROSUVASTATIN CALCIUM 20 MG PO TABS
20.0000 mg | ORAL_TABLET | Freq: Every day | ORAL | Status: DC
  Administered 2023-11-17 – 2023-11-24 (×8): 20 mg via ORAL
  Filled 2023-11-17 (×8): qty 1

## 2023-11-17 MED ORDER — PANTOPRAZOLE SODIUM 40 MG PO TBEC
40.0000 mg | DELAYED_RELEASE_TABLET | Freq: Every day | ORAL | Status: DC
  Administered 2023-11-17 – 2023-11-24 (×8): 40 mg via ORAL
  Filled 2023-11-17 (×8): qty 1

## 2023-11-17 MED ORDER — ARIPIPRAZOLE 5 MG PO TABS
5.0000 mg | ORAL_TABLET | Freq: Every day | ORAL | Status: DC
  Administered 2023-11-17 – 2023-11-24 (×8): 5 mg via ORAL
  Filled 2023-11-17 (×8): qty 1

## 2023-11-17 MED ORDER — SERTRALINE HCL 50 MG PO TABS
50.0000 mg | ORAL_TABLET | Freq: Every day | ORAL | Status: DC
  Administered 2023-11-17 – 2023-11-24 (×8): 50 mg via ORAL
  Filled 2023-11-17 (×8): qty 1

## 2023-11-17 MED ORDER — NALOXONE HCL 4 MG/0.1ML NA LIQD: 1.0000 | Freq: Once | NASAL | Status: DC

## 2023-11-17 NOTE — Evaluation (Signed)
 Speech Language Pathology Evaluation Patient Details Name: [name] MRN: [id number] DOB: Jul 30, 1956 Today's Date: 11/17/2023 Time: 1206-1225 SLP Time Calculation (min) (ACUTE ONLY): 19 min  Problem List:  Patient Active Problem List   Diagnosis Date Noted   AKI (acute kidney injury) (HCC) 11/14/2023   Paraplegia (HCC) 11/14/2023   Renal lesion 11/05/2023   Urge incontinence of urine 11/03/2023   Moderate protein-calorie malnutrition (HCC) 11/03/2023   Community acquired pneumonia of left lower lobe of lung 09/25/2023   Vitamin D deficiency 09/25/2023   Elevated serum glucose 06/23/2023   Fatigue 06/23/2023   Normocytic anemia 06/23/2023   Bilateral lower extremity edema 06/07/2023   Anemia of chronic disease 06/07/2023   Rheumatoid arthritis involving multiple sites (HCC) 04/07/2023   Arthralgia of both hands 04/07/2023   COPD exacerbation (HCC) 01/04/2023   Therapeutic opioid induced constipation 01/04/2023   Chronic active hepatitis (HCC) 01/04/2023   Migraine without aura and without status migrainosus, not intractable 01/04/2023   Primary insomnia 01/04/2023   Diverticulosis 06/25/2021   Abnormality of gait due to impairment of balance 11/22/2020   Mixed incontinence 10/20/2020   Malnutrition of moderate degree (HCC) 10/20/2020   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   Mesenteric ischemia (HCC) 10/16/2020   PTSD (post-traumatic stress disorder)    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HCC) 07/13/2020   BMI 26.0-26.9,adult 03/29/2020   Admission for long-term opiate analgesic use 10/24/2019   Presence of left artificial hip joint 10/24/2019   Senile osteoporosis 10/24/2019   Chronic hypoxemic respiratory failure (HCC) 10/24/2019   Other spondylosis with radiculopathy, lumbar region 10/24/2019   Depression, major, recurrent, moderate (HCC) 10/24/2019   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   Chronic pain of both knees 12/21/2018   Chronic systolic congestive heart failure, NYHA class 2 (HCC)  06/19/2017   Dilated cardiomyopathy (HCC) 06/19/2017   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   Arthritis of right knee 11/27/2016   History of total hip replacement 09/25/2016   Arthritis of right hip 05/29/2016   Coronary artery disease involving native coronary artery of native heart without angina pectoris 05/31/2015   Mixed hyperlipidemia 05/31/2015   Ileus following gastrointestinal surgery (HCC) 03/26/2015   Chronic narcotic use 03/23/2015   GERD (gastroesophageal reflux disease)    Chronic back pain    Past Medical History:  Past Medical History:  Diagnosis Date   Abdominal pain 10/17/2020   Abnormality of gait due to impairment of balance 11/22/2020   Acute diverticulitis 10/17/2020   Admission for long-term opiate analgesic use 10/24/2019   Arthritis of right hip 05/29/2016   Formatting of this note might be different from the original. Added automatically from request for surgery [id number]   BMI 26.0-26.9,adult 03/29/2020   Bronchial asthma    Cardiomyopathy (HCC)    Overview:  Ejection fraction 45% in 2015 Ejection fraction 30 to 35% in November 2018   Chronic back pain    Chronic constipation 07/31/2021   Chronic hypoxemic respiratory failure (HCC) 10/24/2019   Chronic narcotic use 03/23/2015   Chronic pain of both knees 12/21/2018   Added automatically from request for surgery [id number]  Formatting of this note might be different from the original. 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   Dyslipidemia 05/31/2015   GERD (gastroesophageal reflux disease)    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HCC) 07/13/2020   Hypotension 05/17/2020   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   Ileus following gastrointestinal surgery (HCC) 03/26/2015   Major depressive disorder, single episode, moderate (HCC) 10/24/2019   Malnutrition of moderate degree (HCC) 10/20/2020   Mesenteric ischemia (HCC) 10/16/2020   Migraine without aura with status migrainosus 10/24/2019   Mixed incontinence 10/20/2020   Myocardial infarction (HCC)    Nausea & vomiting    Obstructive chronic bronchitis with exacerbation (HCC) 10/25/2019   Other spondylosis with radiculopathy, lumbar region 10/24/2019   Persistent vomiting 05/17/2020   Presence of left artificial hip joint 10/24/2019   PTSD (post-traumatic stress disorder)    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 03/23/2015 04/19/2014   Senile osteoporosis 10/24/2019   Sepsis (HCC) 10/17/2020   Serosanguineous chronic otitis media of right ear 08/02/2021   Small bowel obstruction (HCC)    Thyroid disease    Upper respiratory tract infection due to COVID-19 virus 07/06/2021   Wellness examination 03/27/2021   Past Surgical History:  Past Surgical History:  Procedure Laterality Date   APPENDECTOMY     CARDIAC CATHETERIZATION     CARPAL TUNNEL RELEASE     CESAREAN SECTION     CHF s/p AICD   12/2010   CHOLECYSTECTOMY     COLONOSCOPY  10/16/2015   Mild colonic diverticulosis, predominantly in the left colon. Small internal hemrrhoids. Otherwise normal colonoscopy   COLONOSCOPY WITH PROPOFOL N/A 09/27/2021   Procedure: COLONOSCOPY WITH PROPOFOL;  Surgeon: Napoleon Form,  MD;  Location: [facility];  Service: Endoscopy;  Laterality: N/A;   CORONARY ANGIOPLASTY     ESOPHAGOGASTRODUODENOSCOPY  02/04/2014   Mild gastritis. Otherwise normal EGD   HAND SURGERY Bilateral    ICD GENERATOR CHANGEOUT N/A 05/21/2019   Procedure: ICD GENERATOR CHANGEOUT;  Surgeon: Regan Lemming, MD;  Location: [facility] INVASIVE CV LAB;  Service: Cardiovascular;  Laterality: N/A;   ICD IMPLANT     [employer]   intestinal blockage 2011     IR NEPHROSTOMY PLACEMENT RIGHT  11/16/2023   LAPAROSCOPIC ASSISTED VENTRAL HERNIA REPAIR N/A 03/23/2015   Procedure: LAPAROSCOPIC VENTRAL WALL HERNIA REPAIR;  Surgeon: Karie Soda, MD;  Location: [facility];  Service: General;  Laterality: N/A;  With MESH   LAPAROSCOPIC LYSIS OF ADHESIONS N/A 03/23/2015   Procedure: LAPAROSCOPIC LYSIS OF ADHESIONS;  Surgeon: Karie Soda, MD;  Location: [facility];  Service: General;  Laterality: N/A;   LSCS      x2   NASAL SEPTUM SURGERY     NECK SURGERY     fused   TONSILLECTOMY     TRANSURETHRAL RESECTION OF BLADDER TUMOR N/A 11/16/2023   Procedure: TURBT (TRANSURETHRAL RESECTION OF BLADDER TUMOR);  Surgeon: Rene Paci, MD;  Location: [facility];  Service: Urology;  Laterality: N/A;   TYMPANOSTOMY TUBE PLACEMENT Right 2024   ULNAR NERVE TRANSPOSITION  01/23/2012   Procedure: ULNAR NERVE DECOMPRESSION/TRANSPOSITION;  Surgeon: Cristi Loron, MD;  Location: [facility];  Service: Neurosurgery;  Laterality: Left;  LEFT ulnar nerve decompression   HPI:  68yo female  admitted 11/14/23 with flank pain. PMH: COPD, bladder cancer, spinal stenosis, GERD, pacemaker, depression.   Assessment / Plan / Recommendation Clinical Impression  Pt seen at bedside for cognitive linguistic evaluation. Pt reports 10/10 pain during this session, which may have affected her performance. She indicates she lives with her boyfriend. She has a 10th grade education, and is retired. She does not drive. Pt manages household  responsibilities independently.   Pt presents with intact receptive and expressive language. Speech is fully intelligible. The Mini-Mental State Examination (MMSE) was administered today. Pt scored 26/30 which falls within functional limits. Points were lost on orientation to day/date (pt indicates this is due to being in the hospital) and delayed recall (1/3 recalled). Pt completed clock drawing accurately.   Given lack of new neuro event, pt is likely at or near her baseline level of function. She was encouraged to notify PCP if difficulties arise upon return to normal routines. Acute ST signing off at this time. Please reconsult if needs arise.    SLP Assessment  SLP Recommendation/Assessment: All further Speech Language Pathology needs can be addressed in the next venue of care if needs arise.  SLP Visit Diagnosis: Cognitive communication deficit (R41.841)    Recommendations for follow up therapy are one component of a multi-disciplinary discharge planning process, led by the attending physician.  Recommendations may be updated based on patient status, additional functional criteria and insurance authorization.    Follow Up Recommendations  Follow physician's recommendations for discharge plan and follow up therapies    Assistance Recommended at Discharge  None  Functional Status Assessment Patient has not had a recent decline in their functional status     SLP Evaluation Cognition  Overall Cognitive Status: No family/caregiver present to determine baseline cognitive functioning Arousal/Alertness: Awake/alert Orientation Level: Oriented X4       Comprehension  Auditory Comprehension Overall Auditory Comprehension: Appears within functional limits for tasks assessed    Expression Expression Primary Mode of Expression: Verbal Verbal Expression Overall Verbal Expression: Appears within functional limits for tasks assessed   Oral / Motor  Oral Motor/Sensory Function Overall Oral  Motor/Sensory Function: Within functional limits Motor Speech Overall Motor Speech: Appears within functional limits for tasks assessed           Demoni Gergen B. Murvin Natal, [facility], CCC-SLP Speech Language Pathologist  [facility] 11/17/2023, 12:30 PM

## 2023-11-17 NOTE — Transitions of Care (Post Inpatient/ED Visit) (Signed)
   11/17/2023  Name: [name] MRN: [id number] DOB: [birth year]  Today's TOC FU Call Status: Today's TOC FU Call Status:: Unsuccessful Call (3rd Attempt) Unsuccessful Call (1st Attempt) Date: 11/11/23 Unsuccessful Call (2nd Attempt) Date: 11/14/23 Unsuccessful Call (3rd Attempt) Date: 11/17/23  Attempted to reach the patient regarding the most recent Inpatient/ED visit.  Follow Up Plan: No further outreach attempts will be made at this time. We have been unable to contact the patient.  Signature Karena Addison, LPN [facility] Nurse Health Advisor Direct Dial [phone]

## 2023-11-17 NOTE — Progress Notes (Signed)
 1 Day Post-Op Subjective: Pt resting comfortably.  Still having low back pain despite right PCN placement  Objective: Vital signs in last 24 hours: Temp:  [97.9 F (36.6 C)-98.1 F (36.7 C)] 98.1 F (36.7 C) (03/24 1324) Pulse Rate:  [69-84] 80 (03/24 1324) Resp:  [9-17] 17 (03/24 1000) BP: (95-122)/(60-70) 95/67 (03/24 1324) SpO2:  [96 %-98 %] 96 % (03/24 1324)  Intake/Output from previous day: 03/23 0701 - 03/24 0700 In: 1810.3 [P.O.:580; I.V.:1030.3; IV Piggyback:200] Out: 1350 [Urine:1350]  Intake/Output this shift: Total I/O In: 818.2 [P.O.:220; I.V.:598.2] Out: 425 [Urine:425]  Physical Exam:  General: Alert and oriented CV: RRR, palpable distal pulses Lungs: CTAB, equal chest rise Abdomen: Soft, NTND, no rebound or guarding GU: Foley catheter in place and draining clear-yellow urine.  Right percutaneous nephrostomy tube in place and draining blood-tinged urine.  Lab Results: Recent Labs    11/14/23 1649 11/15/23 0320 11/17/23 0350  HGB 13.2 11.5* 10.5*  HCT 41.9 35.7* 33.5*   BMET Recent Labs    11/16/23 0624 11/17/23 0350  NA 141 140  K 4.5 4.5  CL 111 112*  CO2 17* 19*  GLUCOSE 89 96  BUN 61* 54*  CREATININE 1.75* 1.80*  CALCIUM 8.4* 8.4*     Studies/Results: IR NEPHROSTOMY PLACEMENT RIGHT Result Date: 11/16/2023 INDICATION: 68 year old female with vaginal/bladder mass and right distal ureteral obstruction. Cystoscopy in attempted ureteral stent placement was unsuccessful. Therefore, patient requires placement of a percutaneous nephrostomy tube. EXAM: IR NEPHROSTOMY PLACEMENT RIGHT COMPARISON:  None Available. MEDICATIONS: No additional antibiotics administered. ANESTHESIA/SEDATION: Fentanyl 2 mcg IV; Versed 100 mg IV administered by the radiology nurse Moderate Sedation Time:  10 minutes The patient's vital signs and level of consciousness were continuously monitored during the procedure by the interventional radiology nurse under my direct  supervision. CONTRAST:  15mL OMNIPAQUE IOHEXOL 300 MG/ML SOLN - administered into the collecting system(s) FLUOROSCOPY: Radiation exposure index: 2 mGy reference air kerma COMPLICATIONS: None immediate. TECHNIQUE: The procedure, risks, benefits, and alternatives were explained to the patient. Questions regarding the procedure were encouraged and answered. The patient understands and consents to the procedure. The right flank was prepped with chlorhexidine in a sterile fashion, and a sterile drape was applied covering the operative field. A sterile gown and sterile gloves were used for the procedure. Local anesthesia was provided with 1% Lidocaine. The right flank was interrogated with ultrasound and the left kidney identified. The kidney is hydronephrotic. A suitable access site on the skin overlying the lower pole, posterior calix was identified. After local mg anesthesia was achieved, a small skin nick was made with an 11 blade scalpel. A 21 gauge Accustick needle was then advanced under direct sonographic guidance into the lower pole of the right kidney. A 0.018 inch wire was advanced under fluoroscopic guidance into the left renal collecting system. The Accustick sheath was then advanced over the wire and a 0.018 system exchanged for a 0.035 system. Gentle hand injection of contrast material confirms placement of the sheath within the renal collecting system. There is severe hydronephrosis. The tract from the scan into the renal collecting system was then dilated serially to 10-French. A 10-French Cook all-purpose drain was then placed and positioned under fluoroscopic guidance. The locking loop is well formed within the left renal pelvis. The catheter was secured to the skin with 2-0 Prolene and a sterile bandage was placed. Catheter was left to gravity bag drainage. IMPRESSION: Successful placement of a right 10 French percutaneous nephrostomy tube. Electronically Signed  By: Malachy Moan M.D.   On:  11/16/2023 15:43   DG C-Arm 1-60 Min-No Report Result Date: 11/16/2023 Fluoroscopy was utilized by the requesting physician.  No radiographic interpretation.   DG C-Arm 1-60 Min-No Report Result Date: 11/16/2023 Fluoroscopy was utilized by the requesting physician.  No radiographic interpretation.    Assessment/Plan:  68 year old female, POD 1 s/p TURBT right percutaneous nephrostomy tube placement  -Surgical pathology results are still pending.  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  Will continue to monitor   LOS: 3 days   Rhoderick Moody, MD [facility] Pager: [phone]  11/17/2023, 4:01 PM

## 2023-11-17 NOTE — Progress Notes (Signed)
 [facility]                                                                               [name], is a 68 y.o. female, DOB - 04/10/1956, ZOX:[id number] Admit date - 11/14/2023    Outpatient Primary MD for the patient is Crystal Crigler, FNP  LOS - 3  days    Brief summary    68 year old female with history of COPD, bladder cancer, spinal stenosis who presented to the emergency department with complaint of back pain radiating to her hips, inability to ambulate, weakness of bilateral lower extremity, lack of bowel movement.  Hemodynamically stable on presentation.  MRI could not be done because of presence of pacemaker which was incompatible.  CT myelogram done.  Neurology, neurosurgery, neurosurgery, oncology, radiation oncology consulted.  Back pain/lower extremity weakness suspected to be from metastatic bladder cancer.  Urology planning for cystoscopy with TURBT at [facility].    Assessment & Plan    Assessment and Plan:  Unable to ambulate/ sudden onset:  Unclear etiology, probably from displaced disc at L4/5.  Differential include from gabapentin toxicity. Noncontrast CT of the thoracolumbar spine showed redemonstration of scattered sclerotic axial skeleton lesions suspicious for mets.Also showed L3-L4, L4-L5, L5-S1 disc bulge with question at least moderate central canal stenosis at the L4-L5 level.    Neuro surgery, Dr. Franky Macho consulted, reviewed the images and suggested no compressive lesions and recommended other etiologies to be investigated.  Neurology suggested stopping the gabapentin and reevaluate.  Radiation oncology consulted for evaluation of any role of XRT for the  bladder mass. will follow.  Empirically started her on IV decadron to see if her symptoms will improve.  Check CK levels.    Bladder mass:  CT imaging on March 4 showed concern for bladder wall thickening, neoplasm as well as obstructing mass near the right UVJ and hydronephrosis,  concern for bony metastasis on the pubic rami or suspicious lesion. Severe hydroureteronephrosis.  Urology on board and she underwent cystoscopy with TURBT on 3/23 showing 5 cm bladder mass.  CT imaging on March 4 showed concern for bladder wall thickening, neoplasm as well as obstructing mass near the right UVJ and hydronephrosis, concern for bony metastasis on the pubic rami or suspicious lesion.  CT pelvis with out contrast  ordered to evaluate the anterior vaginal mass .    Anterior vaginal mass:  CT pelvis ordered for further eval.  Gyn onc on board.    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  S/p right PCN by IR on 3/23.  Creatinine slowly improving.  S/p foley catheter.  Continue with IV fluids. On sodium bicarbonate    Lower quadrant abdominal pain:  Pain control with IV dilaudid and oxycodone.   COPD  No wheezing heard.    Leukocytosis Probably from steroids.    Anemia of chronic disease:  Monitor. No obvious signs of bleeding.    Chronic pain syndrome:  Restarted home meds as she was in tears earlier today from pain.   Therapy evaluations ordered.    Estimated body mass index is 33.73 kg/m as calculated from the following:  Height as of this encounter: 4\' 10"  (1.473 m).   Weight as of this encounter: 73.2 kg.  Code Status: full code.  DVT Prophylaxis:  SCDs Start: 11/14/23 2033   Level of Care: Level of care: Med-Surg Family Communication: none at bedside.   Disposition Plan:     Remains inpatient appropriate:  pending further work up for vaginal mass.   Procedures:  CT pelvis pending.   Consultants:   ONcology UROLOGY.  RADIATION ONCOLOGY GYN ONCOLOGY.  IR.   Antimicrobials:   Anti-infectives (From admission, onward)    Start     Dose/Rate Route Frequency Ordered Stop   11/16/23 1330  cefTRIAXone (ROCEPHIN) 2 g in sodium chloride 0.9 % 100 mL IVPB       Note to Pharmacy: Give in IR for surgical prophylaxis   2  g 200 mL/hr over 30 Minutes Intravenous On call 11/16/23 1322 11/17/23 1330   11/16/23 1003  ceFAZolin (ANCEF) 2-4 GM/100ML-% IVPB       Note to Pharmacy: Rodena Goldmann nan: cabinet override      11/16/23 1003 11/16/23 2214   11/14/23 2030  cefTRIAXone (ROCEPHIN) 1 g in sodium chloride 0.9 % 100 mL IVPB        1 g 200 mL/hr over 30 Minutes Intravenous Every 24 hours 11/14/23 2027 11/17/23 0745        Medications  Scheduled Meds:  Chlorhexidine Gluconate Cloth  6 each Topical Daily   dexamethasone (DECADRON) injection  6 mg Intravenous Q24H   fluticasone furoate-vilanterol  1 puff Inhalation Daily   lidocaine  1 patch Transdermal Q24H   polyethylene glycol  17 g Oral Daily   senna-docusate  2 tablet Oral BID   sodium bicarbonate  650 mg Oral BID   sodium chloride flush  3 mL Intravenous Q12H   sodium chloride flush  3-10 mL Intravenous Q12H   Continuous Infusions:  sodium chloride 75 mL/hr at 11/17/23 0931   cefTRIAXone (ROCEPHIN)  IV     PRN Meds:.acetaminophen **OR** acetaminophen, HYDROmorphone (DILAUDID) injection, ipratropium-albuterol, naLOXone (NARCAN)  injection, oxyCODONE    Subjective:   [name] was seen and examined today.  Reports pain not well controlled.   Objective:   Vitals:   11/17/23 0817 11/17/23 0900 11/17/23 0935 11/17/23 1000  BP:      Pulse:      Resp: (!) 9 (!) 9  17  Temp:      TempSrc:      SpO2:   97% 98%  Weight:      Height:        Intake/Output Summary (Last 24 hours) at 11/17/2023 1205 Last data filed at 11/17/2023 1000 Gross per 24 hour  Intake 1671.53 ml  Output 1575 ml  Net 96.53 ml   Filed Weights   11/14/23 1415 11/16/23 1231  Weight: 67 kg 73.2 kg     Exam General exam: ill appearing lady, in mild distress from pain.  Respiratory system: Clear to auscultation. Respiratory effort normal. Cardiovascular system: S1 & S2 heard, RRR. No JVD Gastrointestinal system: Abdomen is soft, supra pubic tenderness.  Central  nervous system: Alert and oriented.  Able to move all extremities.  Extremities: no pedal edema.  Skin: No rashes,  Psychiatry: teary, in pain.    Data Reviewed:  I have personally reviewed following labs and imaging studies   CBC Lab Results  Component Value Date   WBC 14.7 (H) 11/17/2023   RBC 3.61 (L) 11/17/2023   HGB 10.5 (L)  11/17/2023   HCT 33.5 (L) 11/17/2023   MCV 92.8 11/17/2023   MCH 29.1 11/17/2023   PLT 320 11/17/2023   MCHC 31.3 11/17/2023   RDW 14.3 11/17/2023   LYMPHSABS 1.1 11/14/2023   MONOABS 0.4 11/14/2023   EOSABS 0.0 11/14/2023   BASOSABS 0.1 11/14/2023     Last metabolic panel Lab Results  Component Value Date   NA 140 11/17/2023   K 4.5 11/17/2023   CL 112 (H) 11/17/2023   CO2 19 (L) 11/17/2023   BUN 54 (H) 11/17/2023   CREATININE 1.80 (H) 11/17/2023   GLUCOSE 96 11/17/2023   GFRNONAA 30 (L) 11/17/2023   GFRAA 57 (L) 09/05/2020   CALCIUM 8.4 (L) 11/17/2023   PHOS 2.9 10/28/2020   PROT 7.6 11/14/2023   ALBUMIN 3.6 11/14/2023   LABGLOB 2.2 09/25/2023   AGRATIO 1.9 12/31/2022   BILITOT 0.5 11/14/2023   ALKPHOS 72 11/14/2023   AST 24 11/14/2023   ALT 15 11/14/2023   ANIONGAP 9 11/17/2023    CBG (last 3)  No results for input(s): "GLUCAP" in the last 72 hours.    Coagulation Profile: Recent Labs  Lab 11/15/23 0320  INR 1.1     Radiology Studies: IR NEPHROSTOMY PLACEMENT RIGHT Result Date: 11/16/2023 INDICATION: 68 year old female with vaginal/bladder mass and right distal ureteral obstruction. Cystoscopy in attempted ureteral stent placement was unsuccessful. Therefore, patient requires placement of a percutaneous nephrostomy tube. EXAM: IR NEPHROSTOMY PLACEMENT RIGHT COMPARISON:  None Available. MEDICATIONS: No additional antibiotics administered. ANESTHESIA/SEDATION: Fentanyl 2 mcg IV; Versed 100 mg IV administered by the radiology nurse Moderate Sedation Time:  10 minutes The patient's vital signs and level of consciousness were  continuously monitored during the procedure by the interventional radiology nurse under my direct supervision. CONTRAST:  15mL OMNIPAQUE IOHEXOL 300 MG/ML SOLN - administered into the collecting system(s) FLUOROSCOPY: Radiation exposure index: 2 mGy reference air kerma COMPLICATIONS: None immediate. TECHNIQUE: The procedure, risks, benefits, and alternatives were explained to the patient. Questions regarding the procedure were encouraged and answered. The patient understands and consents to the procedure. The right flank was prepped with chlorhexidine in a sterile fashion, and a sterile drape was applied covering the operative field. A sterile gown and sterile gloves were used for the procedure. Local anesthesia was provided with 1% Lidocaine. The right flank was interrogated with ultrasound and the left kidney identified. The kidney is hydronephrotic. A suitable access site on the skin overlying the lower pole, posterior calix was identified. After local mg anesthesia was achieved, a small skin nick was made with an 11 blade scalpel. A 21 gauge Accustick needle was then advanced under direct sonographic guidance into the lower pole of the right kidney. A 0.018 inch wire was advanced under fluoroscopic guidance into the left renal collecting system. The Accustick sheath was then advanced over the wire and a 0.018 system exchanged for a 0.035 system. Gentle hand injection of contrast material confirms placement of the sheath within the renal collecting system. There is severe hydronephrosis. The tract from the scan into the renal collecting system was then dilated serially to 10-French. A 10-French Cook all-purpose drain was then placed and positioned under fluoroscopic guidance. The locking loop is well formed within the left renal pelvis. The catheter was secured to the skin with 2-0 Prolene and a sterile bandage was placed. Catheter was left to gravity bag drainage. IMPRESSION: Successful placement of a right 10  French percutaneous nephrostomy tube. Electronically Signed   By: Isac Caddy.D.  On: 11/16/2023 15:43   DG C-Arm 1-60 Min-No Report Result Date: 11/16/2023 Fluoroscopy was utilized by the requesting physician.  No radiographic interpretation.   DG C-Arm 1-60 Min-No Report Result Date: 11/16/2023 Fluoroscopy was utilized by the requesting physician.  No radiographic interpretation.   CT THORACIC SPINE WO CONTRAST Result Date: 11/15/2023 CLINICAL DATA:  Back pain. Lower extremity weakness. Question cauda acquired syndrome. EXAM: CT MYELOGRAPHY LUMBAR SPINE TECHNIQUE: CT imaging of the thoracic and lumbar spine was performed after Isovue 300M contrast administration. Multiplanar CT image reconstructions were also generated. RADIATION DOSE REDUCTION: This exam was performed according to the departmental dose-optimization program which includes automated exposure control, adjustment of the mA and/or kV according to patient size and/or use of iterative reconstruction technique. COMPARISON:  CT of the thoracic and lumbar spine 11/14/2023. FINDINGS: Thoracic spine Alignment: No significant listhesis is present. Mild rightward curvature is centered at T6. Mild straightening of the normal thoracic kyphosis is stable. Vertebrae: The superior endplate compression fracture at L1 is more severe on the left. Slight retropulsed bone is present without focal stenosis. The superior endplate fracture at T5 demonstrates 30-40% loss of height. No retropulsed bone is present. This may be subacute. Superior endplate fracture at T7 is more acute, with 40-50% loss of height. Slight retropulsed bone is present without significant stenosis. Progressive collapse is present at this level. Sclerotic lesions are present on the left at T2 and T3 extending into the left pedicle at T3. Conus medullaris: Extends to the  level and appears normal. Paraspinal and other soft tissues: The paraspinous soft tissues are within normal  limits. The visualized lung fields are clear. Pacing wires are in place. Severe right-sided hydronephrosis is again noted. Disc levels: No significant central canal stenosis or cord compression is present. Mild right foraminal narrowing present at T8-9, T9-10 and T10-11. Mild left foraminal narrowing present at T5-6 and T6-7. Lumbar spine Segmentation: 5 non rib-bearing lumbar type vertebral bodies are present. The lowest fully formed vertebral body is L5. Alignment: No significant listhesis is present. Levoconvex curvature is centered at L1-2. Mild straightening of the normal lumbar lordosis is present. Vertebrae: Vertebral body heights are normal. No acute or healing fractures are present. Conus medullaris: Extends to the T12-L1   Level and appears normal. Paraspinal and other soft tissues: Atherosclerotic calcifications are present the aorta and branch vessels. No adenopathy is present. Severe right-sided hydronephrosis is again noted. Disc levels: L1-2: Normal disc signal and height is present. No focal protrusion or stenosis is present. L2-3: Normal disc signal and height is present. No focal protrusion or stenosis is present. L3-4: A mild broad-based disc bulge is present. Mild facet hypertrophy is noted bilaterally. Disc material extends into the foramina bilaterally. Mild right foraminal narrowing is present. L4-5: A leftward disc protrusion is present. Moderate left subarticular and foraminal narrowing is present. Mild right foraminal narrowing is present. L5-S1: Chronic loss of disc height is present. A leftward disc protrusion likely contacts the traversing left S1 nerve roots. Facet spurring contributes to mild foraminal narrowing bilaterally, left greater than right. IMPRESSION: 1. No significant cord compression or central canal stenosis to explain the patient's symptoms. 2. Acute superior endplate compression fracture at T7 with 40-50% loss of height. Slight retropulsed bone is present without  significant stenosis. 3. Superior endplate compression fracture at T5 with 30-40% loss of height. No retropulsed bone is present. This may be subacute. 4. Superior endplate compression fracture at L1 is more severe on the left. Slight retropulsed bone  is present without focal stenosis. 5. Sclerotic lesions on the left at T2 and T3 extending into the left pedicle at T3. These are concerning for metastatic disease. 6. Mild right foraminal narrowing at T8-9, T9-10 and T10-11. 7. Mild left foraminal narrowing at T5-6 and T6-7. 8. Moderate left subarticular and foraminal narrowing at L4-5. 9. Mild foraminal narrowing bilaterally at L5-S1, left greater than right. 10. Severe right-sided hydronephrosis is again noted. 11.  Aortic Atherosclerosis (ICD10-I70.0). These results were called by telephone at the time of interpretation on 11/15/2023 at 1:50 pm to Dr. Coletta Memos, Who verbally acknowledged these results. Electronically Signed   By: Marin Roberts M.D.   On: 11/15/2023 15:31   CT LUMBAR SPINE WO CONTRAST Result Date: 11/15/2023 CLINICAL DATA:  Back pain. Lower extremity weakness. Question cauda acquired syndrome. EXAM: CT MYELOGRAPHY LUMBAR SPINE TECHNIQUE: CT imaging of the thoracic and lumbar spine was performed after Isovue 300M contrast administration. Multiplanar CT image reconstructions were also generated. RADIATION DOSE REDUCTION: This exam was performed according to the departmental dose-optimization program which includes automated exposure control, adjustment of the mA and/or kV according to patient size and/or use of iterative reconstruction technique. COMPARISON:  CT of the thoracic and lumbar spine 11/14/2023. FINDINGS: Thoracic spine Alignment: No significant listhesis is present. Mild rightward curvature is centered at T6. Mild straightening of the normal thoracic kyphosis is stable. Vertebrae: The superior endplate compression fracture at L1 is more severe on the left. Slight retropulsed  bone is present without focal stenosis. The superior endplate fracture at T5 demonstrates 30-40% loss of height. No retropulsed bone is present. This may be subacute. Superior endplate fracture at T7 is more acute, with 40-50% loss of height. Slight retropulsed bone is present without significant stenosis. Progressive collapse is present at this level. Sclerotic lesions are present on the left at T2 and T3 extending into the left pedicle at T3. Conus medullaris: Extends to the  level and appears normal. Paraspinal and other soft tissues: The paraspinous soft tissues are within normal limits. The visualized lung fields are clear. Pacing wires are in place. Severe right-sided hydronephrosis is again noted. Disc levels: No significant central canal stenosis or cord compression is present. Mild right foraminal narrowing present at T8-9, T9-10 and T10-11. Mild left foraminal narrowing present at T5-6 and T6-7. Lumbar spine Segmentation: 5 non rib-bearing lumbar type vertebral bodies are present. The lowest fully formed vertebral body is L5. Alignment: No significant listhesis is present. Levoconvex curvature is centered at L1-2. Mild straightening of the normal lumbar lordosis is present. Vertebrae: Vertebral body heights are normal. No acute or healing fractures are present. Conus medullaris: Extends to the T12-L1   Level and appears normal. Paraspinal and other soft tissues: Atherosclerotic calcifications are present the aorta and branch vessels. No adenopathy is present. Severe right-sided hydronephrosis is again noted. Disc levels: L1-2: Normal disc signal and height is present. No focal protrusion or stenosis is present. L2-3: Normal disc signal and height is present. No focal protrusion or stenosis is present. L3-4: A mild broad-based disc bulge is present. Mild facet hypertrophy is noted bilaterally. Disc material extends into the foramina bilaterally. Mild right foraminal narrowing is present. L4-5: A leftward  disc protrusion is present. Moderate left subarticular and foraminal narrowing is present. Mild right foraminal narrowing is present. L5-S1: Chronic loss of disc height is present. A leftward disc protrusion likely contacts the traversing left S1 nerve roots. Facet spurring contributes to mild foraminal narrowing bilaterally, left greater than right.  IMPRESSION: 1. No significant cord compression or central canal stenosis to explain the patient's symptoms. 2. Acute superior endplate compression fracture at T7 with 40-50% loss of height. Slight retropulsed bone is present without significant stenosis. 3. Superior endplate compression fracture at T5 with 30-40% loss of height. No retropulsed bone is present. This may be subacute. 4. Superior endplate compression fracture at L1 is more severe on the left. Slight retropulsed bone is present without focal stenosis. 5. Sclerotic lesions on the left at T2 and T3 extending into the left pedicle at T3. These are concerning for metastatic disease. 6. Mild right foraminal narrowing at T8-9, T9-10 and T10-11. 7. Mild left foraminal narrowing at T5-6 and T6-7. 8. Moderate left subarticular and foraminal narrowing at L4-5. 9. Mild foraminal narrowing bilaterally at L5-S1, left greater than right. 10. Severe right-sided hydronephrosis is again noted. 11.  Aortic Atherosclerosis (ICD10-I70.0). These results were called by telephone at the time of interpretation on 11/15/2023 at 1:50 pm to Dr. Coletta Memos, Who verbally acknowledged these results. Electronically Signed   By: Marin Roberts M.D.   On: 11/15/2023 15:31   DG MYELOGRAPHY LUMBAR INJ MULTI REGION Result Date: 11/15/2023 CLINICAL DATA:  68 year old female with history of back pain, lower extremity weakness. Contrast injection for CT myelogram requested. EXAM: LUMBAR MYELOGRAM FLUOROSCOPY: dictate in minutes and seconds PROCEDURE: After thorough discussion of risks and benefits of the procedure including bleeding,  infection, injury to nerves, blood vessels, adjacent structures as well as headache and CSF leak, written and oral informed consent was obtained. Time out form was completed. Patient was positioned prone on the fluoroscopy table. Local anesthesia was provided with 1% lidocaine without epinephrine after prepped and draped in the usual sterile fashion. Puncture was performed at L2-L3 using a 3 1/2 inch 20-gauge spinal needle via left approach. Using a single pass through the dura, the needle was placed within the thecal sac, with return of clear CSF. 15 mL of Omnipaque 180 was injected into the thecal sac, with normal opacification of the nerve roots and cauda equina consistent with free flow within the subarachnoid space. FINDINGS: Contiguous axial images were obtained through the Lumbar spine after the intrathecal infusion with appropriate contrast dispersal. IMPRESSION: Successful intrathecal administration of contrast for CT myelogram. Performed by: Loyce Dys PA-C Supervised by Dr. Chryl Heck, MD Electronically Signed   By: Malachy Moan M.D.   On: 11/15/2023 14:34       Kathlen Mody M.D. [facility] 11/17/2023, 12:05 PM  Available via Epic secure chat 7am-7pm After 7 pm, please refer to night coverage provider listed on amion.

## 2023-11-17 NOTE — Plan of Care (Signed)

## 2023-11-17 NOTE — Evaluation (Signed)
 Physical Therapy Evaluation Patient Details Name: [name] MRN: [id number] DOB: 01-14-1956 Today's Date: 11/17/2023  History of Present Illness  68 year old female who presented to the emergency department with complaint of back pain radiating to her hips, inability to ambulate, weakness of bilateral lower extremity, MRI could not be done because of presence of pacemaker which was incompatible. CT myelogram done. CT of the thoracolumbar spine showed redemonstration of scattered sclerotic axial skeleton lesions suspicious for mets.Also showed L3-L4, L4-L5, L5-S1 disc bulge with question at least moderate central canal stenosis at the L4-L5 level.     Neurology, neurosurgery, neurosurgery, oncology, radiation oncology and urology consulted. Back pain/lower extremity weakness suspected to be from metastatic bladder cancer. Urology perfomred cystoscopy- TURBT 11/16/23 at [facility].         PMH: COPD, bladder cancer, spinal stenosis, chronic knee pain, sHF, HTN, MDD, PTSD, incisional hernia repair  Clinical Impression  Pt admitted with above diagnosis.  Pt limited to bed level activity today d/t pain. Pt with recent significant decline in functional status.  Patient will benefit from continued  follow up therapy, <3 hours/day at d/c   Pt currently with functional limitations due to the deficits listed below (see PT Problem List). Pt will benefit from acute skilled PT to increase their independence and safety with mobility to allow discharge.           If plan is discharge home, recommend the following: Two people to help with walking and/or transfers;Two people to help with bathing/dressing/bathroom   Can travel by private vehicle        Equipment Recommendations None recommended by PT  Recommendations for Other Services       Functional Status Assessment Patient has had a recent decline in their functional status and demonstrates the ability to make significant improvements in function  in a reasonable and predictable amount of time.     Precautions / Restrictions Precautions Precautions: Fall Restrictions Weight Bearing Restrictions Per Provider Order: No      Mobility  Bed Mobility Overal bed mobility: Needs Assistance Bed Mobility: Rolling Rolling: Min assist, Used rails         General bed mobility comments: cues for knee flexion, assist to bring hips fwd, pt able to self assist with UEs    Transfers                   General transfer comment: NT/pt declined d/t pain    Ambulation/Gait               General Gait Details: NT  Stairs            Wheelchair Mobility     Tilt Bed    Modified Rankin (Stroke Patients Only)       Balance Overall balance assessment:  (unable to test d/t pain)                                           Pertinent Vitals/Pain Pain Assessment Pain Assessment: 0-10 Pain Score: 10-Worst pain ever Pain Location: Lower back and LEs Pain Descriptors / Indicators: Aching, Radiating Pain Intervention(s): Limited activity within patient's tolerance, Monitored during session, Premedicated before session, RN gave pain meds during session, Repositioned    Home Living Family/patient expects to be discharged to:: Private residence Living Arrangements: Spouse/significant other Available Help at Discharge: Family;Available PRN/intermittently Type of Home: House  Home Access: Ramped entrance       Home Layout: One level Home Equipment: Agricultural consultant (2 wheels);Wheelchair - manual      Prior Function Prior Level of Function : Independent/Modified Independent;Driving                     Extremity/Trunk Assessment   Upper Extremity Assessment Upper Extremity Assessment: Defer to OT evaluation    Lower Extremity Assessment Lower Extremity Assessment: Generalized weakness;RLE deficits/detail;LLE deficits/detail RLE Deficits / Details: ankle d/f 1/5, pf 2/5, knee and hip  grossly 2/5; [facility] WFL LLE Deficits / Details: ankle d/f 1/5, pf 2/5, knee and hip grossly 2+/5; [facility]       Communication   Communication Communication: No apparent difficulties    Cognition Arousal: Alert Behavior During Therapy: WFL for tasks assessed/performed   PT - Cognitive impairments: No apparent impairments                         Following commands: Intact       Cueing Cueing Techniques: Verbal cues     General Comments      Exercises     Assessment/Plan    PT Assessment Patient needs continued PT services  PT Problem List Decreased strength;Decreased activity tolerance;Impaired sensation;Decreased knowledge of use of DME;Decreased mobility;Decreased balance       PT Treatment Interventions DME instruction;Therapeutic exercise;Functional mobility training;Therapeutic activities;Patient/family education;Gait training;Wheelchair mobility training    PT Goals (Current goals can be found in the Care Plan section)  Acute Rehab PT Goals PT Goal Formulation: With patient Time For Goal Achievement: 12/01/23 Potential to Achieve Goals: Good    Frequency Min 2X/week     Co-evaluation               AM-PAC PT "6 Clicks" Mobility  Outcome Measure Help needed turning from your back to your side while in a flat bed without using bedrails?: A Little Help needed moving from lying on your back to sitting on the side of a flat bed without using bedrails?: Total Help needed moving to and from a bed to a chair (including a wheelchair)?: Total Help needed standing up from a chair using your arms (e.g., wheelchair or bedside chair)?: Total Help needed to walk in hospital room?: Total Help needed climbing 3-5 steps with a railing? : Total 6 Click Score: 8    End of Session Equipment Utilized During Treatment: Gait belt Activity Tolerance: Patient tolerated treatment well Patient left: with call bell/phone within reach;in bed;with nursing/sitter in  room;with bed alarm set   PT Visit Diagnosis: Other abnormalities of gait and mobility (R26.89);Muscle weakness (generalized) (M62.81)    Time: 1010-1029 PT Time Calculation (min) (ACUTE ONLY): 19 min   Charges:   PT Evaluation $PT Eval Low Complexity: 1 Low   PT General Charges $$ ACUTE PT VISIT: 1 Visit         Warnie Belair, PT  Acute Rehab Dept ([facility]) [phone]  11/17/2023   [facility] 11/17/2023, 1:26 PM

## 2023-11-17 NOTE — Evaluation (Signed)
 Occupational Therapy Evaluation Patient Details Name: [name] MRN: [id number] DOB: 1955/10/20 Today's Date: 11/17/2023   History of Present Illness   68 year old female who presented to the emergency department with complaint of back pain radiating to her hips, inability to ambulate, weakness of bilateral lower extremity, MRI could not be done because of presence of pacemaker which was incompatible. CT myelogram done. CT of the thoracolumbar spine showed redemonstration of scattered sclerotic axial skeleton lesions suspicious for mets.Also showed L3-L4, L4-L5, L5-S1 disc bulge with question at least moderate central canal stenosis at the L4-L5 level.     Neurology, neurosurgery, neurosurgery, oncology, radiation oncology and urology consulted. Back pain/lower extremity weakness suspected to be from metastatic bladder cancer. Urology perfomred cystoscopy- TURBT 11/16/23 at [facility].         PMH: COPD, bladder cancer, spinal stenosis, chronic knee pain, sHF, HTN, MDD, PTSD, incisional hernia repair     Clinical Impressions Pt presents with decline in function and safety with ADLs and ADL mobility with impaired strength, balance and endurance; pt limited by back pain and only agreeable to rolling in bed. Pt reports that PTA she lives with he significant other and that she was Ind with ADLs/selfcare, IADLs, home mgt, cooking, was driving and used no AD for mobility. Pt currently requires min A wit bed mobility and extensive/total A with ADLs/selfcare at bed level. Pt would benefit from acute OT services to address impairments to maximize level of function and safety     If plan is discharge home, recommend the following:   A lot of help with walking and/or transfers;Two people to help with walking and/or transfers;Assistance with cooking/housework;Assist for transportation;Help with stairs or ramp for entrance     Functional Status Assessment   Patient has had a recent decline in their  functional status and demonstrates the ability to make significant improvements in function in a reasonable and predictable amount of time.     Equipment Recommendations   Other (comment) (TBD at next level of care)     Recommendations for Other Services         Precautions/Restrictions   Precautions Precautions: Fall Restrictions Weight Bearing Restrictions Per Provider Order: No     Mobility Bed Mobility Overal bed mobility: Needs Assistance Bed Mobility: Rolling Rolling: Min assist, Used rails         General bed mobility comments: cues for knee flexion, assist to bring hips fwd, pt able to self assist with UEs    Transfers                   General transfer comment: NT/pt declined d/t pain      Balance Overall balance assessment:  (unable to assess due to pain)                                         ADL either performed or assessed with clinical judgement   ADL Overall ADL's : Needs assistance/impaired Eating/Feeding: Independent;Sitting;Bed level   Grooming: Wash/dry hands;Wash/dry face;Set up;Sitting;Bed level   Upper Body Bathing: Minimal assistance;Bed level   Lower Body Bathing: Total assistance;Bed level   Upper Body Dressing : Minimal assistance;Bed level   Lower Body Dressing: Total assistance;Bed level     Toilet Transfer Details (indicate cue type and reason): deferred due to pain Toileting- Clothing Manipulation and Hygiene: Total assistance;Bed level  General ADL Comments: pt limited by back pain and unable to sit EOB or get OOB at this time     Vision Ability to See in Adequate Light: 0 Adequate Patient Visual Report: No change from baseline       Perception         Praxis         Pertinent Vitals/Pain Pain Assessment Pain Assessment: 0-10 Pain Score: 10-Worst pain ever Pain Location: Lower back and LEs Pain Descriptors / Indicators: Aching, Radiating Pain Intervention(s):  Limited activity within patient's tolerance, Monitored during session, Premedicated before session, RN gave pain meds during session, Repositioned     Extremity/Trunk Assessment Upper Extremity Assessment Upper Extremity Assessment: Generalized weakness   Lower Extremity Assessment Lower Extremity Assessment: Defer to PT evaluation RLE Deficits / Details: ankle d/f 1/5, pf 2/5, knee and hip grossly 2/5; [facility] WFL LLE Deficits / Details: ankle d/f 1/5, pf 2/5, knee and hip grossly 2+/5; [facility]       Communication Communication Communication: No apparent difficulties   Cognition Arousal: Alert Behavior During Therapy: WFL for tasks assessed/performed Cognition: No apparent impairments                               Following commands: Intact       Cueing  General Comments   Cueing Techniques: Verbal cues      Exercises     Shoulder Instructions      Home Living Family/patient expects to be discharged to:: Private residence Living Arrangements: Spouse/significant other Available Help at Discharge: Family;Available PRN/intermittently Type of Home: House Home Access: Ramped entrance     Home Layout: One level     Bathroom Shower/Tub: Chief Strategy Officer: Handicapped height     Home Equipment: Agricultural consultant (2 wheels);Wheelchair - manual      Lives With: Significant other    Prior Functioning/Environment Prior Level of Function : Independent/Modified Independent;Driving             Mobility Comments: no AD ADLs Comments: Pt reports Ind with ADLs/selfcare, home mgt    OT Problem List: Decreased strength;Pain;Decreased activity tolerance;Decreased knowledge of use of DME or AE   OT Treatment/Interventions: Self-care/ADL training;DME and/or AE instruction;Therapeutic activities;Patient/family education      OT Goals(Current goals can be found in the care plan section)   Acute Rehab OT Goals Patient Stated Goal: less  pain OT Goal Formulation: With patient Time For Goal Achievement: 12/01/23 Potential to Achieve Goals: Good ADL Goals Pt Will Perform Grooming: with contact guard assist;with supervision;sitting Pt Will Perform Upper Body Bathing: with min assist;with contact guard assist;sitting Pt Will Perform Lower Body Bathing: with max assist;with mod assist;sitting/lateral leans Pt Will Perform Upper Body Dressing: with min assist;with contact guard assist;sitting Pt Will Transfer to Toilet: with max assist;with mod assist;stand pivot transfer;bedside commode Pt Will Perform Toileting - Clothing Manipulation and hygiene: with max assist;with mod assist;sit to/from stand   OT Frequency:  Min 1X/week    Co-evaluation              AM-PAC OT "6 Clicks" Daily Activity     Outcome Measure Help from another person eating meals?: None Help from another person taking care of personal grooming?: A Little Help from another person toileting, which includes using toliet, bedpan, or urinal?: Total Help from another person bathing (including washing, rinsing, drying)?: Total Help from another person to put on and  taking off regular upper body clothing?: A Little Help from another person to put on and taking off regular lower body clothing?: Total 6 Click Score: 13   End of Session    Activity Tolerance: Patient limited by pain Patient left: in bed;with call bell/phone within reach;with nursing/sitter in room  OT Visit Diagnosis: Muscle weakness (generalized) (M62.81);Pain;Other abnormalities of gait and mobility (R26.89) Pain - part of body:  (back)                Time: 1010-1029 OT Time Calculation (min): 19 min Charges:  OT General Charges $OT Visit: 1 Visit OT Evaluation $OT Eval Moderate Complexity: 1 Mod    Galen Manila 11/17/2023, 1:49 PM

## 2023-11-17 NOTE — Progress Notes (Signed)
 Radiation Oncology         [phone] ________________________________  Initial Inpatient Consultation  Name: [name] MRN: [id number]  Date: 11/17/2023  DOB: [birth year]  WU:[id number], Phyllis Ginger, FNP  Coletta Memos, MD   REFERRING PHYSICIAN: Coletta Memos, MD  DIAGNOSIS: G89.29   ICD-10-CM   1. Chronic midline low back pain, unspecified whether sciatica present  M54.50    G89.29     2. Urge incontinence of urine  N39.41      68 yo with a recent bladder cancer diagnosis presenting with low back pain and lower extremity weakness  Patient has a pacemaker   CHIEF COMPLAINT: Here to discuss management of osseous metastatic disease   HISTORY OF PRESENT ILLNESS::[name] is a 68 y.o. female who, upon record review, initially presented with urinary problems earlier this month which prompted a CT AP on 10/28/23 which demonstrated: localized wall thickening of the posterior right urinary bladder concerning for primary bladder neoplasm, and associated obstruction of the right UVJ with high grade right hydronephrosis. CT findings also included: a new indeterminate left adrenal gland nodular lesion measuring 1.3 cm, new sclerotic changes to the left superior pubic ramus/symphysis possibly concerning for metastatic disease, and an age indeterminate but likely subacute-chronic compression fracture of T7, which appeared to be new when compared to prior imaging from 10/16/20.   Per a follow-up visit with her PCP, FNP Christella Hartigan, on 03/12, the patient reported having worsening back pain which would radiate from her mid to lower back and down to there hip. 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 The patient also endorsed some dyspnea at that time, which was attributed to her degenerative spondylosis. With regards to her imaging findings, her PCP recommended a referral to urology for further management.   Before she could meet with urology, she  presented to the ED on 11/14/22 after developing a very sudden increase in her lower back pain approximally 4 days prior, associated with new bilateral LE weakness resulting in an inability to ambulate. She also reported that she was unable to urinate or have a bowel movement since these symptoms progressed. In the ED, she was noted to have visible paraparesis and urinary retention requiring foley placement. She was subsequently admitted for further management and work-up.   Imaging performed upon admission includes:  -- CT of the thoracic and lumbar spine on 11/14/23 which demonstrated: scattered sclerotic axial skeleton lesions in the thoracic spine, a new age-indeterminate T1 compression fracture with at least 40% vertebral body height loss; chronic and stable T5 and T7 compression fractures; evidence of disc bulging throughout L3-L5 and S1 with at least moderate central canal stenosis at the L4-L5 level; an acute minimally displaced right posterior first rib fracture; and severe right hydroureteronephrosis (though partially visualized).  -- CT/Myelography of the thoracic and lumbar spine on 03/22 (performed due to presence of pacemaker) further demonstrated/detailed sclerotic lesions on the left aspects of T2 and T3 extending into the left pedicle at T3 concerning for metastatic disease. Other notable findings included: an acute superior endplate compression fracture at T7 with 40-50% loss of height (w/ slight retropulsed bone present without significant stenosis); a superior endplate compression fracture at T5 with 30-40% loss of height (w/ no retropulsed bone present); a superior endplate compression fracture at L1 (left greater than right and with slight retropulsed bone present without focal stenosis); mild right foraminal narrowing at T8-9, T9-10 and T10-11; mild left foraminal  narrowing at T5-6 and T6-7; moderate left subarticular and foraminal narrowing at L4-5; and mild foraminal narrowing  bilaterally at L5-S1, left greater than right. Severe right-sided hydronephrosis was also redemonstrated.   Based on her work-up thus far showing concern for metastatic bladder cancer, urology was accordingly consulted and the patient promptly underwent a cytoscopy for resection of the bladder tumor (TURBT) yesterday (11/16/23). Pathology from the procedure is pending at this time, however intraoperative findings revealed an invasive 5 cm bladder mass extending from the bladder neck/trigone along the right lateral sidewall, and a palpable anterior vaginal wall mass extending from the introitus to the posterior fornix. Procedure notes also indicated that the bladder mass did not exhibit the typical papillary mucosal features of urothelial carcinoma and is thus concerning for a primary GYN malignancy.   Of note: the patient was seen by Gyn-Onc earlier today ([facility]).    PREVIOUS RADIATION THERAPY: No  PAST MEDICAL HISTORY:  has a past medical history of Abdominal pain (10/17/2020), Abnormality of gait due to impairment of balance (11/22/2020), Acute diverticulitis (10/17/2020), Admission for long-term opiate analgesic use (10/24/2019), Arthritis of right hip (05/29/2016), BMI 26.0-26.9,adult (03/29/2020), Bronchial asthma, Cardiomyopathy (HCC), Chronic back pain, Chronic constipation (07/31/2021), Chronic hypoxemic respiratory failure (HCC) (10/24/2019), Chronic narcotic use (03/23/2015), Chronic pain of both knees (12/21/2018), Chronic systolic congestive heart failure, NYHA class 2 (HCC) (06/19/2017), 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), COPD (chronic obstructive pulmonary disease) (HCC), Coronary artery disease involving native coronary artery of native heart without angina pectoris (05/31/2015), Cystitis (05/17/2020), Dehydration (05/17/2020), Diarrhea (05/17/2020), Dilated cardiomyopathy (HCC) (06/19/2017), Diverticulitis, Diverticulosis, 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), 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 (06/19/2017), Dyslipidemia (05/31/2015), GERD (gastroesophageal reflux disease), 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HCC) (07/13/2020), Hypotension (05/17/2020), ICD (implantable cardioverter-defibrillator) in place (06/19/2017), Ileus following gastrointestinal surgery (HCC) (03/26/2015), Major depressive disorder, single episode, moderate (HCC) (10/24/2019), Malnutrition of moderate degree (HCC) (10/20/2020), Mesenteric ischemia (HCC) (10/16/2020), Migraine without aura with status migrainosus (10/24/2019), Mixed incontinence (10/20/2020), Myocardial infarction (HCC), Nausea & vomiting, Obstructive chronic bronchitis with exacerbation (HCC) (10/25/2019), Other spondylosis with radiculopathy, lumbar region (10/24/2019), Persistent vomiting (05/17/2020), Presence of left artificial hip joint (10/24/2019), PTSD (post-traumatic stress disorder), 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 03/23/2015 (04/19/2014), Senile osteoporosis (10/24/2019), Sepsis (HCC) (10/17/2020), Serosanguineous chronic otitis media of right ear (08/02/2021), Small bowel obstruction (HCC), Thyroid disease, Upper respiratory tract infection due to COVID-19 virus (07/06/2021), and Wellness examination (03/27/2021).    PAST SURGICAL HISTORY: Past Surgical History:  Procedure Laterality Date   APPENDECTOMY     CARDIAC CATHETERIZATION     CARPAL TUNNEL RELEASE     CESAREAN SECTION     CHF s/p AICD   12/2010   CHOLECYSTECTOMY     COLONOSCOPY  10/16/2015   Mild colonic diverticulosis, predominantly in the left colon. Small internal hemrrhoids. Otherwise normal colonoscopy   COLONOSCOPY WITH PROPOFOL N/A 09/27/2021   Procedure: COLONOSCOPY WITH PROPOFOL;  Surgeon: Napoleon Form, MD;  Location: [facility];  Service: Endoscopy;  Laterality: N/A;   CORONARY ANGIOPLASTY     ESOPHAGOGASTRODUODENOSCOPY  02/04/2014   Mild gastritis. Otherwise normal EGD   HAND SURGERY Bilateral    ICD  GENERATOR CHANGEOUT N/A 05/21/2019   Procedure: ICD GENERATOR CHANGEOUT;  Surgeon: Regan Lemming, MD;  Location: [facility] INVASIVE CV LAB;  Service: Cardiovascular;  Laterality: N/A;   ICD IMPLANT     [employer]   intestinal blockage 2011     IR NEPHROSTOMY PLACEMENT RIGHT  11/16/2023   LAPAROSCOPIC ASSISTED VENTRAL HERNIA REPAIR N/A  03/23/2015   Procedure: LAPAROSCOPIC VENTRAL WALL HERNIA REPAIR;  Surgeon: Karie Soda, MD;  Location: [facility];  Service: General;  Laterality: N/A;  With MESH   LAPAROSCOPIC LYSIS OF ADHESIONS N/A 03/23/2015   Procedure: LAPAROSCOPIC LYSIS OF ADHESIONS;  Surgeon: Karie Soda, MD;  Location: [facility];  Service: General;  Laterality: N/A;   LSCS      x2   NASAL SEPTUM SURGERY     NECK SURGERY     fused   TONSILLECTOMY     TRANSURETHRAL RESECTION OF BLADDER TUMOR N/A 11/16/2023   Procedure: TURBT (TRANSURETHRAL RESECTION OF BLADDER TUMOR);  Surgeon: Rene Paci, MD;  Location: [facility];  Service: Urology;  Laterality: N/A;   TYMPANOSTOMY TUBE PLACEMENT Right 2024   ULNAR NERVE TRANSPOSITION  01/23/2012   Procedure: ULNAR NERVE DECOMPRESSION/TRANSPOSITION;  Surgeon: Cristi Loron, MD;  Location: [facility];  Service: Neurosurgery;  Laterality: Left;  LEFT ulnar nerve decompression    FAMILY HISTORY: family history includes Alcohol abuse in her father; Cancer in an other family member; Heart attack in her father, mother, and another family member; Heart failure in an other family member; Hypertension in her father and mother.  SOCIAL HISTORY:  reports that she quit smoking about 3 years ago. Her smoking use included cigarettes. She started smoking about 33 years ago. She has a 15 pack-year smoking history. She has never used smokeless tobacco. She reports current alcohol use of about 2.0 standard drinks of alcohol per week. She reports that she does not use drugs.  ALLERGIES: Patient has no known allergies.  MEDICATIONS:  No current  facility-administered medications for this encounter.   No current outpatient medications on file.   Facility-Administered Medications Ordered in Other Encounters  Medication Dose Route Frequency Provider Last Rate Last Admin   0.9 %  sodium chloride infusion   Intravenous Continuous Rene Paci, MD 75 mL/hr at 11/18/23 0600 Infusion Verify at 11/18/23 0600   acetaminophen (TYLENOL) tablet 650 mg  650 mg Oral Q6H PRN Rene Paci, MD   650 mg at 11/18/23 1610   Or   acetaminophen (TYLENOL) suppository 650 mg  650 mg Rectal Q6H PRN Rene Paci, MD       ARIPiprazole (ABILIFY) tablet 5 mg  5 mg Oral Daily Kathlen Mody, MD   5 mg at 11/17/23 1352   Buprenorphine HCl FILM 900 mcg  900 mcg Buccal Q12H Kathlen Mody, MD       Chlorhexidine Gluconate Cloth 2 % PADS 6 each  6 each Topical Daily Winter, Christopher Aaron, MD   6 each at 11/17/23 1658   dexamethasone (DECADRON) injection 6 mg  6 mg Intravenous Q24H Winter, Christopher Aaron, MD   6 mg at 11/17/23 1643   fluticasone (FLONASE) 50 MCG/ACT nasal spray 2 spray  2 spray Each Nare Daily PRN Kathlen Mody, MD       fluticasone furoate-vilanterol (BREO ELLIPTA) 100-25 MCG/ACT 1 puff  1 puff Inhalation Daily Winter, Christopher Aaron, MD   1 puff at 11/17/23 9604   HYDROmorphone (DILAUDID) injection 0.5 mg  0.5 mg Intravenous Q2H PRN Rene Paci, MD   0.5 mg at 11/17/23 1952   ipratropium-albuterol (DUONEB) 0.5-2.5 (3) MG/3ML nebulizer solution 3 mL  3 mL Nebulization Q4H PRN Rene Paci, MD       lidocaine (LIDODERM) 5 % 1 patch  1 patch Transdermal Q24H Rene Paci, MD   1 patch at 11/17/23 1644   naloxone [facility])  injection 0.4 mg  0.4 mg Intravenous PRN Winter, Christopher Aaron, MD       oxyCODONE (Oxy IR/ROXICODONE) immediate release tablet 5 mg  5 mg Oral Q4H PRN Rene Paci, MD   5 mg at 11/18/23 0332   pantoprazole (PROTONIX) EC tablet 40 mg   40 mg Oral Daily Kathlen Mody, MD   40 mg at 11/17/23 1352   polyethylene glycol (MIRALAX / GLYCOLAX) packet 17 g  17 g Oral Daily Winter, Christopher Aaron, MD   17 g at 11/17/23 1030   ranolazine (RANEXA) 12 hr tablet 500 mg  500 mg Oral BID Kathlen Mody, MD   500 mg at 11/17/23 2229   rosuvastatin (CRESTOR) tablet 20 mg  20 mg Oral Daily Kathlen Mody, MD   20 mg at 11/17/23 1352   senna-docusate (Senokot-S) tablet 2 tablet  2 tablet Oral BID Rene Paci, MD   2 tablet at 11/17/23 2129   sertraline (ZOLOFT) tablet 50 mg  50 mg Oral Daily Kathlen Mody, MD   50 mg at 11/17/23 1352   sodium bicarbonate tablet 650 mg  650 mg Oral BID Rene Paci, MD   650 mg at 11/17/23 2129   sodium chloride flush (NS) 0.9 % injection 3 mL  3 mL Intravenous Q12H Rene Paci, MD   3 mL at 11/17/23 1032   sodium chloride flush (NS) 0.9 % injection 3-10 mL  3-10 mL Intravenous Q12H Lilia Pro, MD   5 mL at 11/17/23 2131    REVIEW OF SYSTEMS:  Notable for that above.   PHYSICAL EXAM:  vitals were not taken for this visit.   In general this is a well appearing female in no acute distress. She's alert and oriented x4 and appropriate throughout the examination. Cardiopulmonary assessment is negative for acute distress and she exhibits normal effort.       ECOG = 4  0 - Asymptomatic (Fully active, able to carry on all predisease activities without restriction)  1 - Symptomatic but completely ambulatory (Restricted in physically strenuous activity but ambulatory and able to carry out work of a light or sedentary nature. For example, light housework, office work)  2 - Symptomatic, <50% in bed during the day (Ambulatory and capable of all self care but unable to carry out any work activities. Up and about more than 50% of waking hours)  3 - Symptomatic, >50% in bed, but not bedbound (Capable of only limited self-care, confined to bed or chair 50% or more of waking  hours)  4 - Bedbound (Completely disabled. Cannot carry on any self-care. Totally confined to bed or chair)  5 - Death   Santiago Glad MM, Creech RH, Tormey DC, et al. [phone]). "Toxicity and response criteria of the [facility]". Am. Evlyn Clines. Oncol. 5 (6): 649-55   LABORATORY DATA:  Lab Results  Component Value Date   WBC 14.7 (H) 11/17/2023   HGB 10.5 (L) 11/17/2023   HCT 33.5 (L) 11/17/2023   MCV 92.8 11/17/2023   PLT 320 11/17/2023   CMP     Component Value Date/Time   NA 140 11/17/2023 0350   NA 139 09/25/2023 1449   K 4.5 11/17/2023 0350   CL 112 (H) 11/17/2023 0350   CO2 19 (L) 11/17/2023 0350   GLUCOSE 96 11/17/2023 0350   BUN 54 (H) 11/17/2023 0350   BUN 32 (H) 09/25/2023 1449   CREATININE 1.80 (H) 11/17/2023 0350   CALCIUM 8.4 (L) 11/17/2023  0350   PROT 7.6 11/14/2023 1649   PROT 6.5 09/25/2023 1449   ALBUMIN 3.6 11/14/2023 1649   ALBUMIN 4.3 09/25/2023 1449   AST 24 11/14/2023 1649   ALT 15 11/14/2023 1649   ALKPHOS 72 11/14/2023 1649   BILITOT 0.5 11/14/2023 1649   BILITOT 0.3 09/25/2023 1449   EGFR 27.0 11/03/2023 0927   EGFR 31 (L) 09/25/2023 1449   GFRNONAA 30 (L) 11/17/2023 0350         RADIOGRAPHY: CT PELVIS WO CONTRAST Result Date: 11/17/2023 CLINICAL DATA:  Bladder cancer to look for metastasis. EXAM: CT PELVIS WITHOUT CONTRAST TECHNIQUE: Multidetector CT imaging of the pelvis was performed following the standard protocol without intravenous contrast. RADIATION DOSE REDUCTION: This exam was performed according to the departmental dose-optimization program which includes automated exposure control, adjustment of the mA and/or kV according to patient size and/or use of iterative reconstruction technique. COMPARISON:  CT abdomen and pelvis 10/25/2020 FINDINGS: Urinary Tract: Foley catheter decompresses the bladder. Distal left ureter appears dilated, possibly reflux or obstruction. No stones are demonstrated. Bowel: Visualized small and  large bowel are not abnormally distended. Prominent diverticulosis of the sigmoid colon. No definitive inflammatory changes to suggest acute diverticulitis. Vascular/Lymphatic: Mild calcification in the iliac arteries. No significant lymphadenopathy. Reproductive:  Uterus and ovaries are not enlarged. Other: Nonspecific soft tissue thickening in the presacral perirectal fat. This could represent a hematoma in the setting of sacral fractures. See below. Alternatively, this could represent postradiation or postsurgical change if there has been surgery or radiation to this area. Alternatively, infectious, inflammatory, or neoplastic etiologies could be considered. No discrete mass or fluid collection. Musculoskeletal: Postoperative right hip arthroplasty. Degenerative changes in the lower lumbar spine. Nondisplaced fracture of the sacrum at the level of S2. This may represent a stress fracture or insufficiency fracture. Old appearing fracture deformity of the left inferior pubic ramus. IMPRESSION: 1. Foley catheter decompresses the bladder, limiting evaluation. 2. No lymphadenopathy or soft tissue mass identified. 3. Colonic diverticulosis without evidence of acute diverticulitis. 4. Nonspecific soft tissue thickening in the presacral space. Possibly etiologies include infectious, inflammatory, postoperative/postradiation, or posttraumatic change. 5. Nondisplaced fractures of the sacrum at the level of S2, possibly stress or insufficiency fracture. No destructive bone lesions are identified. Electronically Signed   By: Burman Nieves M.D.   On: 11/17/2023 20:23   IR NEPHROSTOMY PLACEMENT RIGHT Result Date: 11/16/2023 INDICATION: 68 year old female with vaginal/bladder mass and right distal ureteral obstruction. Cystoscopy in attempted ureteral stent placement was unsuccessful. Therefore, patient requires placement of a percutaneous nephrostomy tube. EXAM: IR NEPHROSTOMY PLACEMENT RIGHT COMPARISON:  None Available.  MEDICATIONS: No additional antibiotics administered. ANESTHESIA/SEDATION: Fentanyl 2 mcg IV; Versed 100 mg IV administered by the radiology nurse Moderate Sedation Time:  10 minutes The patient's vital signs and level of consciousness were continuously monitored during the procedure by the interventional radiology nurse under my direct supervision. CONTRAST:  15mL OMNIPAQUE IOHEXOL 300 MG/ML SOLN - administered into the collecting system(s) FLUOROSCOPY: Radiation exposure index: 2 mGy reference air kerma COMPLICATIONS: None immediate. TECHNIQUE: The procedure, risks, benefits, and alternatives were explained to the patient. Questions regarding the procedure were encouraged and answered. The patient understands and consents to the procedure. The right flank was prepped with chlorhexidine in a sterile fashion, and a sterile drape was applied covering the operative field. A sterile gown and sterile gloves were used for the procedure. Local anesthesia was provided with 1% Lidocaine. The right flank was interrogated with ultrasound and the left  kidney identified. The kidney is hydronephrotic. A suitable access site on the skin overlying the lower pole, posterior calix was identified. After local mg anesthesia was achieved, a small skin nick was made with an 11 blade scalpel. A 21 gauge Accustick needle was then advanced under direct sonographic guidance into the lower pole of the right kidney. A 0.018 inch wire was advanced under fluoroscopic guidance into the left renal collecting system. The Accustick sheath was then advanced over the wire and a 0.018 system exchanged for a 0.035 system. Gentle hand injection of contrast material confirms placement of the sheath within the renal collecting system. There is severe hydronephrosis. The tract from the scan into the renal collecting system was then dilated serially to 10-French. A 10-French Cook all-purpose drain was then placed and positioned under fluoroscopic guidance.  The locking loop is well formed within the left renal pelvis. The catheter was secured to the skin with 2-0 Prolene and a sterile bandage was placed. Catheter was left to gravity bag drainage. IMPRESSION: Successful placement of a right 10 French percutaneous nephrostomy tube. Electronically Signed   By: Malachy Moan M.D.   On: 11/16/2023 15:43   DG C-Arm 1-60 Min-No Report Result Date: 11/16/2023 Fluoroscopy was utilized by the requesting physician.  No radiographic interpretation.   DG C-Arm 1-60 Min-No Report Result Date: 11/16/2023 Fluoroscopy was utilized by the requesting physician.  No radiographic interpretation.   CT THORACIC SPINE WO CONTRAST Result Date: 11/15/2023 CLINICAL DATA:  Back pain. Lower extremity weakness. Question cauda acquired syndrome. EXAM: CT MYELOGRAPHY LUMBAR SPINE TECHNIQUE: CT imaging of the thoracic and lumbar spine was performed after Isovue 300M contrast administration. Multiplanar CT image reconstructions were also generated. RADIATION DOSE REDUCTION: This exam was performed according to the departmental dose-optimization program which includes automated exposure control, adjustment of the mA and/or kV according to patient size and/or use of iterative reconstruction technique. COMPARISON:  CT of the thoracic and lumbar spine 11/14/2023. FINDINGS: Thoracic spine Alignment: No significant listhesis is present. Mild rightward curvature is centered at T6. Mild straightening of the normal thoracic kyphosis is stable. Vertebrae: The superior endplate compression fracture at L1 is more severe on the left. Slight retropulsed bone is present without focal stenosis. The superior endplate fracture at T5 demonstrates 30-40% loss of height. No retropulsed bone is present. This may be subacute. Superior endplate fracture at T7 is more acute, with 40-50% loss of height. Slight retropulsed bone is present without significant stenosis. Progressive collapse is present at this level.  Sclerotic lesions are present on the left at T2 and T3 extending into the left pedicle at T3. Conus medullaris: Extends to the  level and appears normal. Paraspinal and other soft tissues: The paraspinous soft tissues are within normal limits. The visualized lung fields are clear. Pacing wires are in place. Severe right-sided hydronephrosis is again noted. Disc levels: No significant central canal stenosis or cord compression is present. Mild right foraminal narrowing present at T8-9, T9-10 and T10-11. Mild left foraminal narrowing present at T5-6 and T6-7. Lumbar spine Segmentation: 5 non rib-bearing lumbar type vertebral bodies are present. The lowest fully formed vertebral body is L5. Alignment: No significant listhesis is present. Levoconvex curvature is centered at L1-2. Mild straightening of the normal lumbar lordosis is present. Vertebrae: Vertebral body heights are normal. No acute or healing fractures are present. Conus medullaris: Extends to the T12-L1   Level and appears normal. Paraspinal and other soft tissues: Atherosclerotic calcifications are present the aorta and branch  vessels. No adenopathy is present. Severe right-sided hydronephrosis is again noted. Disc levels: L1-2: Normal disc signal and height is present. No focal protrusion or stenosis is present. L2-3: Normal disc signal and height is present. No focal protrusion or stenosis is present. L3-4: A mild broad-based disc bulge is present. Mild facet hypertrophy is noted bilaterally. Disc material extends into the foramina bilaterally. Mild right foraminal narrowing is present. L4-5: A leftward disc protrusion is present. Moderate left subarticular and foraminal narrowing is present. Mild right foraminal narrowing is present. L5-S1: Chronic loss of disc height is present. A leftward disc protrusion likely contacts the traversing left S1 nerve roots. Facet spurring contributes to mild foraminal narrowing bilaterally, left greater than right.  IMPRESSION: 1. No significant cord compression or central canal stenosis to explain the patient's symptoms. 2. Acute superior endplate compression fracture at T7 with 40-50% loss of height. Slight retropulsed bone is present without significant stenosis. 3. Superior endplate compression fracture at T5 with 30-40% loss of height. No retropulsed bone is present. This may be subacute. 4. Superior endplate compression fracture at L1 is more severe on the left. Slight retropulsed bone is present without focal stenosis. 5. Sclerotic lesions on the left at T2 and T3 extending into the left pedicle at T3. These are concerning for metastatic disease. 6. Mild right foraminal narrowing at T8-9, T9-10 and T10-11. 7. Mild left foraminal narrowing at T5-6 and T6-7. 8. Moderate left subarticular and foraminal narrowing at L4-5. 9. Mild foraminal narrowing bilaterally at L5-S1, left greater than right. 10. Severe right-sided hydronephrosis is again noted. 11.  Aortic Atherosclerosis (ICD10-I70.0). These results were called by telephone at the time of interpretation on 11/15/2023 at 1:50 pm to Dr. Coletta Memos, Who verbally acknowledged these results. Electronically Signed   By: Marin Roberts M.D.   On: 11/15/2023 15:31   CT LUMBAR SPINE WO CONTRAST Result Date: 11/15/2023 CLINICAL DATA:  Back pain. Lower extremity weakness. Question cauda acquired syndrome. EXAM: CT MYELOGRAPHY LUMBAR SPINE TECHNIQUE: CT imaging of the thoracic and lumbar spine was performed after Isovue 300M contrast administration. Multiplanar CT image reconstructions were also generated. RADIATION DOSE REDUCTION: This exam was performed according to the departmental dose-optimization program which includes automated exposure control, adjustment of the mA and/or kV according to patient size and/or use of iterative reconstruction technique. COMPARISON:  CT of the thoracic and lumbar spine 11/14/2023. FINDINGS: Thoracic spine Alignment: No significant  listhesis is present. Mild rightward curvature is centered at T6. Mild straightening of the normal thoracic kyphosis is stable. Vertebrae: The superior endplate compression fracture at L1 is more severe on the left. Slight retropulsed bone is present without focal stenosis. The superior endplate fracture at T5 demonstrates 30-40% loss of height. No retropulsed bone is present. This may be subacute. Superior endplate fracture at T7 is more acute, with 40-50% loss of height. Slight retropulsed bone is present without significant stenosis. Progressive collapse is present at this level. Sclerotic lesions are present on the left at T2 and T3 extending into the left pedicle at T3. Conus medullaris: Extends to the  level and appears normal. Paraspinal and other soft tissues: The paraspinous soft tissues are within normal limits. The visualized lung fields are clear. Pacing wires are in place. Severe right-sided hydronephrosis is again noted. Disc levels: No significant central canal stenosis or cord compression is present. Mild right foraminal narrowing present at T8-9, T9-10 and T10-11. Mild left foraminal narrowing present at T5-6 and T6-7. Lumbar spine Segmentation: 5 non rib-bearing  lumbar type vertebral bodies are present. The lowest fully formed vertebral body is L5. Alignment: No significant listhesis is present. Levoconvex curvature is centered at L1-2. Mild straightening of the normal lumbar lordosis is present. Vertebrae: Vertebral body heights are normal. No acute or healing fractures are present. Conus medullaris: Extends to the T12-L1   Level and appears normal. Paraspinal and other soft tissues: Atherosclerotic calcifications are present the aorta and branch vessels. No adenopathy is present. Severe right-sided hydronephrosis is again noted. Disc levels: L1-2: Normal disc signal and height is present. No focal protrusion or stenosis is present. L2-3: Normal disc signal and height is present. No focal  protrusion or stenosis is present. L3-4: A mild broad-based disc bulge is present. Mild facet hypertrophy is noted bilaterally. Disc material extends into the foramina bilaterally. Mild right foraminal narrowing is present. L4-5: A leftward disc protrusion is present. Moderate left subarticular and foraminal narrowing is present. Mild right foraminal narrowing is present. L5-S1: Chronic loss of disc height is present. A leftward disc protrusion likely contacts the traversing left S1 nerve roots. Facet spurring contributes to mild foraminal narrowing bilaterally, left greater than right. IMPRESSION: 1. No significant cord compression or central canal stenosis to explain the patient's symptoms. 2. Acute superior endplate compression fracture at T7 with 40-50% loss of height. Slight retropulsed bone is present without significant stenosis. 3. Superior endplate compression fracture at T5 with 30-40% loss of height. No retropulsed bone is present. This may be subacute. 4. Superior endplate compression fracture at L1 is more severe on the left. Slight retropulsed bone is present without focal stenosis. 5. Sclerotic lesions on the left at T2 and T3 extending into the left pedicle at T3. These are concerning for metastatic disease. 6. Mild right foraminal narrowing at T8-9, T9-10 and T10-11. 7. Mild left foraminal narrowing at T5-6 and T6-7. 8. Moderate left subarticular and foraminal narrowing at L4-5. 9. Mild foraminal narrowing bilaterally at L5-S1, left greater than right. 10. Severe right-sided hydronephrosis is again noted. 11.  Aortic Atherosclerosis (ICD10-I70.0). These results were called by telephone at the time of interpretation on 11/15/2023 at 1:50 pm to Dr. Coletta Memos, Who verbally acknowledged these results. Electronically Signed   By: Marin Roberts M.D.   On: 11/15/2023 15:31   DG MYELOGRAPHY LUMBAR INJ MULTI REGION Result Date: 11/15/2023 CLINICAL DATA:  68 year old female with history of back  pain, lower extremity weakness. Contrast injection for CT myelogram requested. EXAM: LUMBAR MYELOGRAM FLUOROSCOPY: dictate in minutes and seconds PROCEDURE: After thorough discussion of risks and benefits of the procedure including bleeding, infection, injury to nerves, blood vessels, adjacent structures as well as headache and CSF leak, written and oral informed consent was obtained. Time out form was completed. Patient was positioned prone on the fluoroscopy table. Local anesthesia was provided with 1% lidocaine without epinephrine after prepped and draped in the usual sterile fashion. Puncture was performed at L2-L3 using a 3 1/2 inch 20-gauge spinal needle via left approach. Using a single pass through the dura, the needle was placed within the thecal sac, with return of clear CSF. 15 mL of Omnipaque 180 was injected into the thecal sac, with normal opacification of the nerve roots and cauda equina consistent with free flow within the subarachnoid space. FINDINGS: Contiguous axial images were obtained through the Lumbar spine after the intrathecal infusion with appropriate contrast dispersal. IMPRESSION: Successful intrathecal administration of contrast for CT myelogram. Performed by: Loyce Dys PA-C Supervised by Dr. Chryl Heck, MD Electronically  Signed   By: Malachy Moan M.D.   On: 11/15/2023 14:34   CT THORACIC SPINE WO CONTRAST Result Date: 11/14/2023 CLINICAL DATA:  Mid-back pain, abnormal neuro, positive xray (Ped 0-17y); Low back pain, cauda equina syndrome suspected EXAM: CT THORACIC AND LUMBAR SPINE WITHOUT CONTRAST TECHNIQUE: Multidetector CT imaging of the thoracic and lumbar spine was performed without contrast. Multiplanar CT image reconstructions were also generated. RADIATION DOSE REDUCTION: This exam was performed according to the departmental dose-optimization program which includes automated exposure control, adjustment of the mA and/or kV according to patient size and/or use  of iterative reconstruction technique. COMPARISON:  MRI lumbar spine 12/12/2008, CT chest 10/17/2023: CT abdomen pelvis 10/28/2023 FINDINGS: CT THORACIC SPINE FINDINGS Alignment: Normal. Vertebrae: Diffusely decreased bone density. Interval development of an age-indeterminate T1 compression fracture with at least 40% vertebral body height loss. Chronic stable T5, T7 compression fracture. Redemonstration of scattered sclerotic axial skeleton lesions. Multilevel mild degenerative changes of the spine. Paraspinal and other soft tissues: Negative. Disc levels: Maintained. CT LUMBAR SPINE FINDINGS Segmentation: 5 lumbar type vertebrae. Alignment: Normal. Vertebrae: Diffusely decreased bone density. No severe osseous neural foraminal or central canal stenosis. L3-L4, L4-L5, L5-S1 disc bulge with question at least moderate central canal stenosis at the L4-L5 level. No acute fracture or focal pathologic process. Paraspinal and other soft tissues: Negative. Disc levels: Maintained. Other:. Diffuse bronchial wall thickening. Limited evaluation of the lungs due to motion artifact. Severe right hydroureteronephrosis that is partially visualized. Atherosclerotic plaque. Cardiac leads. Status post cholecystectomy. Acute minimally displaced right posterior first rib fracture (5:21). IMPRESSION: IMPRESSION CT THORACIC SPINE IMPRESSION 1. No acute displaced fracture or traumatic listhesis of the thoracic spine. 2. Redemonstration of scattered sclerotic axial skeleton lesions. Question metastases. CT LUMBAR SPINE IMPRESSION 1. No acute displaced fracture or traumatic listhesis of the lumbar spine. 2. L3-L4, L4-L5, L5-S1 disc bulge with question at least moderate central canal stenosis at the L4-L5 level. Recommend MRI for further evaluation. Other imaging findings of potential clinical significance: 1. Acute minimally displaced right posterior first rib fracture 2. Severe right hydroureteronephrosis partially visualized. Finding  better evaluated on CT abdomen pelvis 10/28/2023 3.  Aortic Atherosclerosis (ICD10-I70.0). Electronically Signed   By: Tish Frederickson M.D.   On: 11/14/2023 20:53   CT LUMBAR SPINE WO CONTRAST Result Date: 11/14/2023 CLINICAL DATA:  Mid-back pain, abnormal neuro, positive xray (Ped 0-17y); Low back pain, cauda equina syndrome suspected EXAM: CT THORACIC AND LUMBAR SPINE WITHOUT CONTRAST TECHNIQUE: Multidetector CT imaging of the thoracic and lumbar spine was performed without contrast. Multiplanar CT image reconstructions were also generated. RADIATION DOSE REDUCTION: This exam was performed according to the departmental dose-optimization program which includes automated exposure control, adjustment of the mA and/or kV according to patient size and/or use of iterative reconstruction technique. COMPARISON:  MRI lumbar spine 12/12/2008, CT chest 10/17/2023: CT abdomen pelvis 10/28/2023 FINDINGS: CT THORACIC SPINE FINDINGS Alignment: Normal. Vertebrae: Diffusely decreased bone density. Interval development of an age-indeterminate T1 compression fracture with at least 40% vertebral body height loss. Chronic stable T5, T7 compression fracture. Redemonstration of scattered sclerotic axial skeleton lesions. Multilevel mild degenerative changes of the spine. Paraspinal and other soft tissues: Negative. Disc levels: Maintained. CT LUMBAR SPINE FINDINGS Segmentation: 5 lumbar type vertebrae. Alignment: Normal. Vertebrae: Diffusely decreased bone density. No severe osseous neural foraminal or central canal stenosis. L3-L4, L4-L5, L5-S1 disc bulge with question at least moderate central canal stenosis at the L4-L5 level. No acute fracture or focal pathologic process. Paraspinal and other  soft tissues: Negative. Disc levels: Maintained. Other:. Diffuse bronchial wall thickening. Limited evaluation of the lungs due to motion artifact. Severe right hydroureteronephrosis that is partially visualized. Atherosclerotic plaque.  Cardiac leads. Status post cholecystectomy. Acute minimally displaced right posterior first rib fracture (5:21). IMPRESSION: IMPRESSION CT THORACIC SPINE IMPRESSION 1. No acute displaced fracture or traumatic listhesis of the thoracic spine. 2. Redemonstration of scattered sclerotic axial skeleton lesions. Question metastases. CT LUMBAR SPINE IMPRESSION 1. No acute displaced fracture or traumatic listhesis of the lumbar spine. 2. L3-L4, L4-L5, L5-S1 disc bulge with question at least moderate central canal stenosis at the L4-L5 level. Recommend MRI for further evaluation. Other imaging findings of potential clinical significance: 1. Acute minimally displaced right posterior first rib fracture 2. Severe right hydroureteronephrosis partially visualized. Finding better evaluated on CT abdomen pelvis 10/28/2023 3.  Aortic Atherosclerosis (ICD10-I70.0). Electronically Signed   By: Tish Frederickson M.D.   On: 11/14/2023 20:53   DG Chest 2 View Result Date: 11/06/2023 CLINICAL DATA:  Chest pain.  COPD. EXAM: CHEST - 2 VIEW COMPARISON:  October 10, 2023. FINDINGS: Stable cardiomediastinal silhouette. Left-sided defibrillator is unchanged. Both lungs are clear. The visualized skeletal structures are unremarkable. IMPRESSION: No active cardiopulmonary disease. Electronically Signed   By: Lupita Raider M.D.   On: 11/06/2023 17:09   CUP PACEART INCLINIC DEVICE CHECK Result Date: 10/20/2023 Normal in-clinic ICD check. Thresholds, sensing, and impedance WNL or stable for patient over time. Brief AT/NSVT episodes. Estimated longevity 6.9 years.  Referred to [facility] for fluid management.  Pt enrolled in remote follow-up.Ancil Boozer, BSN, RN     IMPRESSION/PLAN: 68 yo with a recent bladder cancer diagnosis presenting with low back pain and lower extremity weakness   I have personally reviewed this patient's case with Dr. Basilio Cairo today. CT of the spine shows degenerative changes to the spine, but no obvious metastatic  disease or compressive lesions. Her case was also discussed at our tumor board earlier today and the consensus was that the patient's current symptoms are unlikely related to spinal cord compression from metastatic disease. She continues work-up for other etiologies. At this time, there is no clear role for radiation therapy. We will continue to monitor and are willing to reassess as needed.   On date of service, in total, I spent 60 minutes on this encounter. Patient was seen in person.   __________________________________________    Bryan Lemma, PA-C  This document serves as a record of services personally performed by Bryan Lemma, PA-C. It was created on her behalf by Neena Rhymes, a trained medical scribe. The creation of this record is based on the scribe's personal observations and the provider's statements to them. This document has been checked and approved by the attending provider.

## 2023-11-17 NOTE — [place] FL2 (Signed)
 [place] MEDICAID FL2 LEVEL OF CARE FORM     IDENTIFICATION  Patient Name: [name] GUYQIH Birthdate: [birth year] Sex: female Admission Date (Current Location): 11/14/2023  [facility] and [place] Number:  Producer, television/film/video and Address:  [facility],  [address] [place], [place] [address]      Provider Number: [id number]  Attending Physician Name and Address:  Kathlen Mody, MD  Relative Name and Phone Number:       Current Level of Care: Hospital Recommended Level of Care: Skilled Nursing Facility Prior Approval Number:    Date Approved/Denied:   PASRR Number: [id number]  Discharge Plan: SNF    Current Diagnoses: Patient Active Problem List   Diagnosis Date Noted   AKI (acute kidney injury) (HCC) 11/14/2023   Paraplegia (HCC) 11/14/2023   Renal lesion 11/05/2023   Urge incontinence of urine 11/03/2023   Moderate protein-calorie malnutrition (HCC) 11/03/2023   Community acquired pneumonia of left lower lobe of lung 09/25/2023   Vitamin D deficiency 09/25/2023   Elevated serum glucose 06/23/2023   Fatigue 06/23/2023   Normocytic anemia 06/23/2023   Bilateral lower extremity edema 06/07/2023   Anemia of chronic disease 06/07/2023   Rheumatoid arthritis involving multiple sites (HCC) 04/07/2023   Arthralgia of both hands 04/07/2023   COPD exacerbation (HCC) 01/04/2023   Therapeutic opioid induced constipation 01/04/2023   Chronic active hepatitis (HCC) 01/04/2023   Migraine without aura and without status migrainosus, not intractable 01/04/2023   Primary insomnia 01/04/2023   Diverticulosis 06/25/2021   Abnormality of gait due to impairment of balance 11/22/2020   Mixed incontinence 10/20/2020   Malnutrition of moderate degree (HCC) 10/20/2020   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   Mesenteric ischemia (HCC) 10/16/2020   PTSD (post-traumatic stress disorder)    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HCC) 07/13/2020   BMI 26.0-26.9,adult 03/29/2020   Admission for long-term  opiate analgesic use 10/24/2019   Presence of left artificial hip joint 10/24/2019   Senile osteoporosis 10/24/2019   Chronic hypoxemic respiratory failure (HCC) 10/24/2019   Other spondylosis with radiculopathy, lumbar region 10/24/2019   Depression, major, recurrent, moderate (HCC) 10/24/2019   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   Chronic pain of both knees 12/21/2018   Chronic systolic congestive heart failure, NYHA class 2 (HCC) 06/19/2017   Dilated cardiomyopathy (HCC) 06/19/2017   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   Arthritis of right knee 11/27/2016   History of total hip replacement 09/25/2016   Arthritis of right hip 05/29/2016   Coronary artery disease involving native coronary artery of native heart without angina pectoris 05/31/2015   Mixed hyperlipidemia 05/31/2015   Ileus following gastrointestinal surgery (HCC) 03/26/2015   Chronic narcotic use 03/23/2015   GERD (gastroesophageal reflux disease)    Chronic back pain     Orientation RESPIRATION BLADDER Height & Weight     Self, Time, Situation, Place  Normal Continent Weight: 161 lb 6 oz (73.2 kg) Height:  4\' 10"  (147.3 cm)  BEHAVIORAL SYMPTOMS/MOOD NEUROLOGICAL BOWEL NUTRITION STATUS      Incontinent Diet (regular)  AMBULATORY STATUS COMMUNICATION OF NEEDS Skin     Verbally Normal                       Personal Care Assistance Level of Assistance  Bathing, Dressing, Feeding Bathing Assistance: Limited assistance Feeding assistance: Independent Dressing Assistance: Limited assistance     Functional Limitations Info  Sight, Hearing, Speech Sight Info: Adequate Hearing Info:  Adequate Speech Info: Adequate    SPECIAL CARE FACTORS FREQUENCY  PT (By licensed PT), OT (By licensed OT)     PT Frequency: 5 x a week OT Frequency: 5 x a week            Contractures Contractures Info: Not present     Additional Factors Info  Code Status, Allergies Code Status Info: full Allergies Info: NKA           Current Medications (11/17/2023):  This is the current hospital active medication list Current Facility-Administered Medications  Medication Dose Route Frequency Provider Last Rate Last Admin   0.9 %  sodium chloride infusion   Intravenous Continuous Winter, Christopher Aaron, MD 75 mL/hr at 11/17/23 1437 Infusion Verify at 11/17/23 1437   acetaminophen (TYLENOL) tablet 650 mg  650 mg Oral Q6H PRN Rene Paci, MD       Or   acetaminophen (TYLENOL) suppository 650 mg  650 mg Rectal Q6H PRN Rene Paci, MD       ARIPiprazole (ABILIFY) tablet 5 mg  5 mg Oral Daily Kathlen Mody, MD   5 mg at 11/17/23 1352   Buprenorphine HCl FILM 900 mcg  900 mcg Buccal Q12H Kathlen Mody, MD       Chlorhexidine Gluconate Cloth 2 % PADS 6 each  6 each Topical Daily Winter, Christopher Aaron, MD   6 each at 11/15/23 1612   dexamethasone (DECADRON) injection 6 mg  6 mg Intravenous Q24H Winter, Christopher Aaron, MD   6 mg at 11/16/23 1632   fluticasone (FLONASE) 50 MCG/ACT nasal spray 2 spray  2 spray Each Nare Daily PRN Kathlen Mody, MD       fluticasone furoate-vilanterol (BREO ELLIPTA) 100-25 MCG/ACT 1 puff  1 puff Inhalation Daily Winter, Christopher Aaron, MD   1 puff at 11/17/23 9562   HYDROmorphone (DILAUDID) injection 0.5 mg  0.5 mg Intravenous Q2H PRN Rene Paci, MD   0.5 mg at 11/17/23 1541   ipratropium-albuterol (DUONEB) 0.5-2.5 (3) MG/3ML nebulizer solution 3 mL  3 mL Nebulization Q4H PRN Rene Paci, MD       lidocaine (LIDODERM) 5 % 1 patch  1 patch Transdermal Q24H Winter, Christopher Aaron, MD   1 patch at 11/16/23 1633   naloxone [facility]) injection 0.4 mg  0.4 mg Intravenous PRN Winter, Christopher Aaron, MD       oxyCODONE (Oxy IR/ROXICODONE) immediate release tablet 5 mg  5 mg Oral Q4H PRN Rene Paci, MD   5 mg at  11/17/23 1209   pantoprazole (PROTONIX) EC tablet 40 mg  40 mg Oral Daily Kathlen Mody, MD   40 mg at 11/17/23 1352   polyethylene glycol (MIRALAX / GLYCOLAX) packet 17 g  17 g Oral Daily Winter, Christopher Aaron, MD   17 g at 11/17/23 1030   ranolazine (RANEXA) 12 hr tablet 500 mg  500 mg Oral BID Kathlen Mody, MD   500 mg at 11/17/23 1352   rosuvastatin (CRESTOR) tablet 20 mg  20 mg Oral Daily Kathlen Mody, MD   20 mg at 11/17/23 1352   senna-docusate (Senokot-S) tablet 2 tablet  2 tablet Oral BID Rene Paci, MD   2 tablet at 11/17/23 1030   sertraline (ZOLOFT) tablet 50 mg  50 mg Oral Daily Kathlen Mody, MD   50 mg at 11/17/23 1352   sodium bicarbonate tablet 650 mg  650 mg Oral BID Rene Paci, MD   650 mg at 11/17/23 1030  sodium chloride flush (NS) 0.9 % injection 3 mL  3 mL Intravenous Q12H Rene Paci, MD   3 mL at 11/17/23 1032   sodium chloride flush (NS) 0.9 % injection 3-10 mL  3-10 mL Intravenous Q12H Lilia Pro, MD   10 mL at 11/17/23 1032     Discharge Medications: Please see discharge summary for a list of discharge medications.  Relevant Imaging Results:  Relevant Lab Results:   Additional Information SSN:[id number]  Valentina Shaggy Jeani Fassnacht, LCSW

## 2023-11-17 NOTE — Consult Note (Signed)
 Gynecologic Oncology Consultation  Micaela Stith Tufte 68 y.o. female  CC:  Chief Complaint  Patient presents with   Flank Pain    HPI: Callaway Hardigree is a 68 year old female who presented to the ER via EMS on 11/14/2023 with flank and lower extremity pain, inability to walk, weakness. She reported her symptoms starting around 1 week ago. She also voiced difficulty with urination and intermittent incontinence. She had underwent a CT scan of the AP with and without contrast at [facility] on 10/28/2023 revealing: localized wall thickening of the posterior right urinary bladder, concerning for primary bladder neoplasm. Associated obstruction of the right UVJ with high grade hydroureteronephrosis. New indeterminate left adrenal gland nodular lesion measuring 1.3 cm. New heterogenous sclerotic changes to the left superior pubic ramus/symphysis, may be metastatic. Age indeterminate but probably subacute-chronic compression fracture of T7, new since 09/2020.  ER labs included creatinine 2.79, BUN 83, CO2 19, WBC 12.4, UA with ketones/ trace leukocytes/rare bacteria. She had CT imaging of the spine on 11/14/2023: CT Thoracic IMPRESSION  1. No acute displaced fracture or traumatic listhesis of the thoracic spine. 2. Redemonstration of scattered sclerotic axial skeleton lesions. Question metastases. CT LUMBAR SPINE IMPRESSION 1. No acute displaced fracture or traumatic listhesis of the lumbar spine. 2. L3-L4, L4-L5, L5-S1 disc bulge with question at least moderate central canal stenosis at the L4-L5 level. Recommend MRI for further evaluation. Other imaging findings of potential clinical significance: 1. Acute minimally displaced right posterior first rib fracture 2. Severe right hydroureteronephrosis partially visualized. Finding better evaluated on CT abdomen pelvis 10/28/2023 3.  Aortic Atherosclerosis (ICD10-I70.0).  She underwent cystoscopy and TURPT with Dr. Sande Brothers on 11/16/2023 with operative  findings including: Invasive 5 cm bladder mass extending from the bladder neck/trigone along the right lateral sidewall. 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 Severe effacement of the bladder neck/trigone.  Neither ureteral orifice could be clearly identified due to the effacement Palpable anterior vaginal wall mass extending from the introitus to the posterior fornix  Pathology is pending. IR placed a right PCN on 11/16/2023.   Past medical history includes bladder mass, spinal stenosis, chronic back pain that has worsened, diverticulitis, significant cardiac disease per consult visit with Dr. Maisie Fus on 03/12/2021, pacemaker in place, COPD.  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 Last pap in the [facility] system was 03/2005 with pap negative at that time.   Interval History:  Patient reports worsening excruciating back pain with inability to walk or stand which prompted ER evaluation. 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 She reports having a scan at [facility] for further evaluation. Denies vaginal bleeding. Thinks she went through menopause maybe in her 49s. Pt reports possibly having a pap smear 10 yrs ago or more. No abnormal pap smears to her knowledge. G3, 2 c sections and 1 vaginal delivery. She reports an episode of having a piece of tissue come from her urethra 2 weeks ago. No blood in urine. No unintentional weight loss. She has been constipated for awhile. No family history of malignancy to her knowledge.   Review of Systems: See interval.  Current Meds: Current inpatient and outpatient medications reviewed  Allergy: No Known Allergies  Social Hx:   Social History   Socioeconomic History   Marital status: Single    Spouse name:  Not on file   Number of children: 3   Years of education: Not on file    Highest education level: 10th grade  Occupational History   Occupation: disabled  Tobacco Use   Smoking status: Former    Current packs/day: 0.00    Average packs/day: 0.5 packs/day for 30.0 years (15.0 ttl pk-yrs)    Types: Cigarettes    Start date: 03/1990    Quit date: 03/2020    Years since quitting: 3.6   Smokeless tobacco: Never  Vaping Use   Vaping status: Never Used  Substance and Sexual Activity   Alcohol use: Yes    Alcohol/week: 2.0 standard drinks of alcohol    Types: 1 Cans of beer, 1 Standard drinks or equivalent per week    Comment: seldom   Drug use: No   Sexual activity: Not Currently  Other Topics Concern   Not on file  Social History Narrative   One level home with boyfriend   Caffeine - coffee 1-2 cups/day; Green tea 4-5 bottles a day   Exercise - some    Right handed      Social Drivers of Health   Financial Resource Strain: Low Risk  (04/05/2023)   Overall Financial Resource Strain (CARDIA)    Difficulty of Paying Living Expenses: Not very hard  Food Insecurity: No Food Insecurity (11/15/2023)   Hunger Vital Sign    Worried About Running Out of Food in the Last Year: Never true    Ran Out of Food in the Last Year: Never true  Transportation Needs: Unmet Transportation Needs (11/15/2023)   PRAPARE - Administrator, Civil Service (Medical): Yes    Lack of Transportation (Non-Medical): No  Physical Activity: Insufficiently Active (04/05/2023)   Exercise Vital Sign    Days of Exercise per Week: 3 days    Minutes of Exercise per Session: 20 min  Stress: Stress Concern Present (04/05/2023)   [employer] of Occupational Health - Occupational Stress Questionnaire    Feeling of Stress : Very much  Social Connections: Moderately Isolated (11/15/2023)   Social Connection and Isolation Panel [NHANES]    Frequency of Communication with Friends and Family: More than three times a week    Frequency of Social Gatherings with Friends and Family:  More than three times a week    Attends Religious Services: 1 to 4 times per year    Active Member of [employer] or Organizations: No    Attends Banker Meetings: Never    Marital Status: Divorced  Catering manager Violence: Not At Risk (11/15/2023)   Humiliation, Afraid, Rape, and Kick questionnaire    Fear of Current or Ex-Partner: No    Emotionally Abused: No    Physically Abused: No    Sexually Abused: No    Past Surgical Hx:  Past Surgical History:  Procedure Laterality Date   APPENDECTOMY     CARDIAC CATHETERIZATION     CARPAL TUNNEL RELEASE     CESAREAN SECTION     CHF s/p AICD   12/2010   CHOLECYSTECTOMY     COLONOSCOPY  10/16/2015   Mild colonic diverticulosis, predominantly in the left colon. Small internal hemrrhoids. Otherwise normal colonoscopy   COLONOSCOPY WITH PROPOFOL N/A 09/27/2021   Procedure: COLONOSCOPY WITH PROPOFOL;  Surgeon: Napoleon Form, MD;  Location: [facility];  Service: Endoscopy;  Laterality: N/A;   CORONARY ANGIOPLASTY     ESOPHAGOGASTRODUODENOSCOPY  02/04/2014   Mild gastritis. Otherwise normal EGD  HAND SURGERY Bilateral    ICD GENERATOR CHANGEOUT N/A 05/21/2019   Procedure: ICD GENERATOR CHANGEOUT;  Surgeon: Regan Lemming, MD;  Location: [facility] INVASIVE CV LAB;  Service: Cardiovascular;  Laterality: N/A;   ICD IMPLANT     Medtronic   intestinal blockage 2011     IR NEPHROSTOMY PLACEMENT RIGHT  11/16/2023   LAPAROSCOPIC ASSISTED VENTRAL HERNIA REPAIR N/A 03/23/2015   Procedure: LAPAROSCOPIC VENTRAL WALL HERNIA REPAIR;  Surgeon: Karie Soda, MD;  Location: [facility] ORS;  Service: General;  Laterality: N/A;  With MESH   LAPAROSCOPIC LYSIS OF ADHESIONS N/A 03/23/2015   Procedure: LAPAROSCOPIC LYSIS OF ADHESIONS;  Surgeon: Karie Soda, MD;  Location: [facility] ORS;  Service: General;  Laterality: N/A;   LSCS      x2   NASAL SEPTUM SURGERY     NECK SURGERY     fused   TONSILLECTOMY     TYMPANOSTOMY TUBE PLACEMENT Right 2024   ULNAR  NERVE TRANSPOSITION  01/23/2012   Procedure: ULNAR NERVE DECOMPRESSION/TRANSPOSITION;  Surgeon: Cristi Loron, MD;  Location: [facility] NEURO ORS;  Service: Neurosurgery;  Laterality: Left;  LEFT ulnar nerve decompression    Past Medical Hx:  Past Medical History:  Diagnosis Date   Abdominal pain 10/17/2020   Abnormality of gait due to impairment of balance 11/22/2020   Acute diverticulitis 10/17/2020   Admission for long-term opiate analgesic use 10/24/2019   Arthritis of right hip 05/29/2016   Formatting of this note might be different from the original. Added automatically from request for surgery [id number]   BMI 26.0-26.9,adult 03/29/2020   Bronchial asthma    Cardiomyopathy (HCC)    Overview:  Ejection fraction 45% in 2015 Ejection fraction 30 to 35% in November 2018   Chronic back pain    Chronic constipation 07/31/2021   Chronic hypoxemic respiratory failure (HCC) 10/24/2019   Chronic narcotic use 03/23/2015   Chronic pain of both knees 12/21/2018   Added automatically from request for surgery [id number]  Formatting of this note might be different from the original. 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   Hypotension 05/17/2020   ICD (implantable  cardioverter-defibrillator) in place 06/19/2017   Ileus following gastrointestinal surgery (HCC) 03/26/2015   Major depressive disorder, single episode, moderate (HCC) 10/24/2019   Malnutrition of moderate degree (HCC) 10/20/2020   Mesenteric ischemia (HCC) 10/16/2020   Migraine without aura with status migrainosus 10/24/2019   Mixed incontinence 10/20/2020   Myocardial infarction [facility])    Nausea & vomiting    Obstructive chronic bronchitis with exacerbation (HCC) 10/25/2019   Other spondylosis with radiculopathy, lumbar region 10/24/2019   Persistent vomiting 05/17/2020   Presence of left artificial hip joint 10/24/2019   PTSD (post-traumatic stress disorder)    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 03/23/2015 04/19/2014   Senile  osteoporosis 10/24/2019   Sepsis (HCC) 10/17/2020   Serosanguineous chronic otitis media of right ear 08/02/2021   Small bowel obstruction (HCC)    Thyroid disease    Upper respiratory tract infection due to COVID-19 virus 07/06/2021   Wellness examination 03/27/2021    Family Hx:  Family History  Problem Relation Age of Onset   Hypertension Mother    Heart attack Mother    Alcohol abuse Father    Hypertension Father    Heart attack Father    Heart attack Other    Cancer Other    Heart failure Other    Anesthesia problems Neg Hx    Hypotension Neg Hx    Malignant hyperthermia Neg Hx    Pseudochol deficiency Neg Hx    Breast cancer Neg Hx     Vitals:  Blood pressure 105/66, pulse 69, temperature 97.9 F (36.6 C), temperature source Oral, resp. rate 17, height 4\' 10"  (1.473 m), weight 161 lb 6 oz (73.2 kg), SpO2 98%.  Physical Exam:  General: alert, cooperative, no distress, and tearful at times discussing current situation Resp: clear to auscultation bilaterally Cardio: regular rate and rhythm, S1, S2 normal, no murmur, click, rub or gallop GI: soft, non-tender; bowel sounds normal; no masses,  no  organomegaly Extremities: mild generalized edema, more pedally-not a new finding per pt. Pt able to wiggle her toes. With myself as chaperone, pelvic exam performed including speculum examination. See addition to note for pelvic exam findings from Dr. Alvester Morin.  Assessment/Plan: 68 year old female currently admitted with a bladder mass, AKI, inability to ambulate with back pain. She underwent cystoscopy with TURBT with Dr. Liliane Shi on 11/16/2023 with 5 cm bladder mass and palpable anterior vaginal wall mass at that time. Path is pending. Dr. Alvester Morin is recommending patient become NPO after midnight for possible pelvic examination under anesthesia with gyn biopsies tomorrow pending results of biopsy from Dr. Alphonsa Overall procedure and if there is a question of origin on path. MRI of the pelvis is recommended by Dr Alvester Morin but will plan for outpatient. Our office spoke with Arlys John at [facility] who after review, states pacemaker and leads are compatible with MRI. Pacemaker info on note from 04/2019. With the patient having to be transferred back to [facility] in order to undergo a MRI given pacemaker, Dr. Alvester Morin feels the MRI can be planned for outpatient after discharge. See addition to note from Dr. Alvester Morin.    Doylene Bode, NP 11/17/2023, 11:30 AM

## 2023-11-17 NOTE — TOC Initial Note (Signed)
 Transition of Care [facility]) - Initial/Assessment Note    Patient Details  Name: [name] MRN: [id number] Date of Birth: 01-09-1956  [facility] CM/SW Contact:    Larrie Kass, LCSW Phone Number: 11/17/2023, 2:19 PM  Clinical Narrative:                 CSW met with the Pt at the bedside to discuss recommendations for SNF placement. Pt has agreed with no preferences. CSW explained the process, noting that the Pt will need insurance authorization. CSW will work with the Pt on the SNF placement. TOC to follow  Expected Discharge Plan: Skilled Nursing Facility Barriers to Discharge: Continued Medical Work up   Patient Goals and CMS Choice Patient states their goals for this hospitalization and ongoing recovery are:: SNf to get stonger CMS Medicare.gov Compare Post Acute Care list provided to:: Patient Choice offered to / list presented to : Patient      Expected Discharge Plan and Services       Living arrangements for the past 2 months: Single Family Home                                      Prior Living Arrangements/Services Living arrangements for the past 2 months: Single Family Home Lives with:: Self, Significant Other   Do you feel safe going back to the place where you live?: Yes      Need for Family Participation in Patient Care: No (Comment) Care giver support system in place?: No (comment)      Activities of Daily Living   ADL Screening (condition at time of admission) Independently performs ADLs?: No Does the patient have a NEW difficulty with bathing/dressing/toileting/self-feeding that is expected to last >3 days?: Yes (Initiates electronic notice to provider for possible OT consult) Does the patient have a NEW difficulty with getting in/out of bed, walking, or climbing stairs that is expected to last >3 days?: Yes (Initiates electronic notice to provider for possible PT consult) Does the patient have a NEW difficulty with  communication that is expected to last >3 days?: Yes (Initiates electronic notice to provider for possible SLP consult) Is the patient deaf or have difficulty hearing?: No Does the patient have difficulty seeing, even when wearing glasses/contacts?: No Does the patient have difficulty concentrating, remembering, or making decisions?: No  Permission Sought/Granted                  Emotional Assessment Appearance:: Appears stated age     Orientation: : Oriented to Self, Oriented to Place, Oriented to Situation, Oriented to  Time      Admission diagnosis:  Paraplegia (HCC) [G82.20] Urinary retention [R33.9] AKI (acute kidney injury) (HCC) [N17.9] Patient Active Problem List   Diagnosis Date Noted   AKI (acute kidney injury) (HCC) 11/14/2023   Paraplegia (HCC) 11/14/2023   Renal lesion 11/05/2023   Urge incontinence of urine 11/03/2023   Moderate protein-calorie malnutrition (HCC) 11/03/2023   Community acquired pneumonia of left lower lobe of lung 09/25/2023   Vitamin D deficiency 09/25/2023   Elevated serum glucose 06/23/2023   Fatigue 06/23/2023   Normocytic anemia 06/23/2023   Bilateral lower extremity edema 06/07/2023   Anemia of chronic disease 06/07/2023   Rheumatoid arthritis involving multiple sites (HCC) 04/07/2023   Arthralgia of both hands 04/07/2023   COPD exacerbation (HCC) 01/04/2023   Therapeutic opioid induced constipation 01/04/2023  Chronic active hepatitis (HCC) 01/04/2023   Migraine without aura and without status migrainosus, not intractable 01/04/2023   Primary insomnia 01/04/2023   Diverticulosis 06/25/2021   Abnormality of gait due to impairment of balance 11/22/2020   Mixed incontinence 10/20/2020   Malnutrition of moderate degree (HCC) 10/20/2020   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   Mesenteric ischemia (HCC) 10/16/2020   PTSD (post-traumatic stress disorder)    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HCC) 07/13/2020   BMI 26.0-26.9,adult 03/29/2020   Admission for  long-term opiate analgesic use 10/24/2019   Presence of left artificial hip joint 10/24/2019   Senile osteoporosis 10/24/2019   Chronic hypoxemic respiratory failure (HCC) 10/24/2019   Other spondylosis with radiculopathy, lumbar region 10/24/2019   Depression, major, recurrent, moderate (HCC) 10/24/2019   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   Chronic pain of both knees 12/21/2018   Chronic systolic congestive heart failure, NYHA class 2 (HCC) 06/19/2017   Dilated cardiomyopathy (HCC) 06/19/2017   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   Arthritis of right knee 11/27/2016   History of total hip replacement 09/25/2016   Arthritis of right hip 05/29/2016   Coronary artery disease involving native coronary artery of native heart without angina pectoris 05/31/2015   Mixed hyperlipidemia 05/31/2015   Ileus following gastrointestinal surgery (HCC) 03/26/2015   Chronic narcotic use 03/23/2015   GERD (gastroesophageal reflux disease)    Chronic back pain    PCP:  [name], FNP Pharmacy:   [facility] [phone] Rosalita Levan, [place] - [address] AT [facility] [address] [place] [address] Phone: [phone] Fax: [phone]  [facility] - [place], [place] - [address] [facility] [place] [employer] [place] [address] Phone: [phone] Fax: [phone]  [facility] - [address] [place] - [address] [place] [address] Phone: [phone] Fax: [phone]  [facility] - [place], [place] - [address] [place] [address] Phone: [phone] Fax: [phone]  [facility] [address], [place] - [address] [facility] [place] [address] Phone: [phone] Fax:  [phone]     Social Drivers of Health (SDOH) Social History: SDOH Screenings   Food Insecurity: No Food Insecurity (11/15/2023)  Housing: Low Risk  (11/15/2023)  Transportation Needs: Unmet Transportation Needs (11/15/2023)  Utilities: Not At Risk (11/15/2023)  Alcohol Screen: Low Risk  (12/31/2022)  Depression (PHQ2-9): High Risk (09/25/2023)  Financial Resource Strain: Low Risk  (04/05/2023)  Physical Activity: Insufficiently Active (04/05/2023)  Social Connections: Moderately Isolated (11/15/2023)  Stress: Stress Concern Present (04/05/2023)  Tobacco Use: Medium Risk (11/16/2023)  Health Literacy: Adequate Health Literacy (04/07/2023)   SDOH Interventions: Food Insecurity Interventions: Patient Declined   Readmission Risk Interventions     No data to display

## 2023-11-18 ENCOUNTER — Encounter

## 2023-11-18 DIAGNOSIS — C679 Malignant neoplasm of bladder, unspecified: Secondary | ICD-10-CM

## 2023-11-18 DIAGNOSIS — M899 Disorder of bone, unspecified: Secondary | ICD-10-CM | POA: Insufficient documentation

## 2023-11-18 DIAGNOSIS — N3289 Other specified disorders of bladder: Secondary | ICD-10-CM | POA: Diagnosis not present

## 2023-11-18 DIAGNOSIS — R339 Retention of urine, unspecified: Secondary | ICD-10-CM | POA: Diagnosis not present

## 2023-11-18 DIAGNOSIS — G8220 Paraplegia, unspecified: Secondary | ICD-10-CM | POA: Diagnosis not present

## 2023-11-18 DIAGNOSIS — N898 Other specified noninflammatory disorders of vagina: Secondary | ICD-10-CM | POA: Diagnosis not present

## 2023-11-18 DIAGNOSIS — N179 Acute kidney failure, unspecified: Secondary | ICD-10-CM | POA: Diagnosis not present

## 2023-11-18 HISTORY — DX: Disorder of bone, unspecified: M89.9

## 2023-11-18 HISTORY — DX: Malignant neoplasm of bladder, unspecified: C67.9

## 2023-11-18 LAB — BASIC METABOLIC PANEL
Anion gap: 8 (ref 5–15)
BUN: 45 mg/dL — ABNORMAL HIGH (ref 8–23)
CO2: 20 mmol/L — ABNORMAL LOW (ref 22–32)
Calcium: 8.5 mg/dL — ABNORMAL LOW (ref 8.9–10.3)
Chloride: 112 mmol/L — ABNORMAL HIGH (ref 98–111)
Creatinine, Ser: 1.83 mg/dL — ABNORMAL HIGH (ref 0.44–1.00)
GFR, Estimated: 30 mL/min — ABNORMAL LOW (ref >60)
Glucose, Bld: 90 mg/dL (ref 70–99)
Potassium: 4.4 mmol/L (ref 3.5–5.1)
Sodium: 140 mmol/L (ref 135–145)

## 2023-11-18 LAB — SURGICAL PATHOLOGY

## 2023-11-18 NOTE — Assessment & Plan Note (Addendum)
 Unclear etiology.  Recommend biopsy for diagnosis.

## 2023-11-18 NOTE — Assessment & Plan Note (Signed)
 Improved after nephrostomy tube and TURBT Continue adequate hydration

## 2023-11-18 NOTE — Progress Notes (Signed)
 [facility]                                                                               [name], is a 68 y.o. female, DOB - [birth year], ZOX:[id number] Admit date - 11/14/2023    Outpatient Primary MD for the patient is [name], FNP  LOS - 4  days    Brief summary    68 year old female with history of COPD, bladder cancer, spinal stenosis who presented to the emergency department with complaint of back pain radiating to her hips, inability to ambulate, weakness of bilateral lower extremity, lack of bowel movement.  Hemodynamically stable on presentation.  MRI could not be done because of presence of pacemaker which was incompatible.  CT myelogram done.  Neurology, neurosurgery, neurosurgery, oncology, radiation oncology consulted.  Back pain/lower extremity weakness suspected to be from metastatic bladder cancer.  Patient underwent cystoscopy with TURBT at [facility].    Assessment & Plan    Assessment and Plan:  Unable to ambulate/ sudden onset:  Unclear etiology, probably from displaced disc at L4/5.  Differential include from gabapentin toxicity. Noncontrast CT of the thoracolumbar spine showed redemonstration of scattered sclerotic axial skeleton lesions suspicious for mets.Also showed L3-L4, L4-L5, L5-S1 disc bulge with question at least moderate central canal stenosis at the L4-L5 level.    Neuro surgery, Dr. Franky Macho consulted, reviewed the images and suggested no compressive lesions and recommended other etiologies to be investigated.  Neurology suggested stopping the gabapentin and reevaluate.  Radiation oncology consulted for evaluation of any role of XRT for the  bladder mass. will follow.  Empirically started her on IV decadron to see if her symptoms will improve.  CK levels low .    Bladder mass:  CT imaging on March 4 showed concern for bladder wall thickening, neoplasm as well as obstructing mass near the right UVJ and hydronephrosis,  concern for bony metastasis on the pubic rami or suspicious lesion. Severe hydroureteronephrosis.  Urology on board and she underwent cystoscopy with TURBT on 3/23 showing 5 cm bladder mass.  CT imaging on March 4 showed concern for bladder wall thickening, neoplasm as well as obstructing mass near the right UVJ and hydronephrosis, concern for bony metastasis on the pubic rami or suspicious lesion.  CT pelvis with out contrast  reviewed with the patient.  Pathology shows bladder malignancy.  She will need PET scan outpatient.     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  S/p right PCN by IR on 3/23.  Creatinine slowly improving.  S/p foley catheter.  Continue with IV fluids. On sodium bicarbonate  Repeat bmp today.    Lower quadrant abdominal pain:  Pain control with IV dilaudid and oxycodone.  Restarted her home meds.   COPD  No wheezing heard.    Leukocytosis Probably from steroids.    Anemia of chronic disease:  Monitor. No obvious signs of bleeding.    Chronic pain syndrome:  Restarted home meds as she was in tears earlier today from pain.   Therapy evaluations ordered. Recommending SNF.  Toc on board and aware.    Estimated body mass index is  33.73 kg/m as calculated from the following:   Height as of this encounter: 4\' 10"  (1.473 m).   Weight as of this encounter: 73.2 kg.  Code Status: full code.  DVT Prophylaxis:  SCDs Start: 11/14/23 2033   Level of Care: Level of care: Med-Surg Family Communication: none at bedside.   Disposition Plan:     Remains inpatient appropriate:  clinical improvement. SNF placement.   Procedures:  CT pelvis   Consultants:   ONcology UROLOGY.  RADIATION ONCOLOGY GYN ONCOLOGY.  IR.   Antimicrobials:   Anti-infectives (From admission, onward)    Start     Dose/Rate Route Frequency Ordered Stop   11/16/23 1330  cefTRIAXone (ROCEPHIN) 2 g in sodium chloride 0.9 % 100 mL IVPB       Note to  Pharmacy: Give in IR for surgical prophylaxis   2 g 200 mL/hr over 30 Minutes Intravenous On call 11/16/23 1322 11/17/23 1330   11/16/23 1003  ceFAZolin (ANCEF) 2-4 GM/100ML-% IVPB       Note to Pharmacy: Rodena Goldmann nan: cabinet override      11/16/23 1003 11/16/23 2214   11/14/23 2030  cefTRIAXone (ROCEPHIN) 1 g in sodium chloride 0.9 % 100 mL IVPB        1 g 200 mL/hr over 30 Minutes Intravenous Every 24 hours 11/14/23 2027 11/17/23 0745        Medications  Scheduled Meds:  ARIPiprazole  5 mg Oral Daily   Buprenorphine HCl  900 mcg Buccal Q12H   Chlorhexidine Gluconate Cloth  6 each Topical Daily   dexamethasone (DECADRON) injection  6 mg Intravenous Q24H   fluticasone furoate-vilanterol  1 puff Inhalation Daily   lidocaine  1 patch Transdermal Q24H   pantoprazole  40 mg Oral Daily   polyethylene glycol  17 g Oral Daily   ranolazine  500 mg Oral BID   rosuvastatin  20 mg Oral Daily   senna-docusate  2 tablet Oral BID   sertraline  50 mg Oral Daily   sodium bicarbonate  650 mg Oral BID   sodium chloride flush  3 mL Intravenous Q12H   sodium chloride flush  3-10 mL Intravenous Q12H   Continuous Infusions:  sodium chloride 75 mL/hr at 11/18/23 0600   PRN Meds:.acetaminophen **OR** acetaminophen, fluticasone, HYDROmorphone (DILAUDID) injection, ipratropium-albuterol, naLOXone (NARCAN)  injection, oxyCODONE    Subjective:   [name] was seen and examined today.  Still not able to walk. Pain is better controlled today.   Objective:   Vitals:   11/17/23 2000 11/17/23 2045 11/17/23 2100 11/18/23 0513  BP: 129/72   115/72  Pulse:    66  Resp: 11  14 18   Temp:  98 F (36.7 C)  97.9 F (36.6 C)  TempSrc:      SpO2:    98%  Weight:      Height:        Intake/Output Summary (Last 24 hours) at 11/18/2023 1309 Last data filed at 11/18/2023 1200 Gross per 24 hour  Intake 1859.77 ml  Output 595 ml  Net 1264.77 ml   Filed Weights   11/14/23 1415 11/16/23 1231   Weight: 67 kg 73.2 kg     Exam General exam: Ill appearing lady, not in distress.  Respiratory system: Clear to auscultation. Respiratory effort normal. Cardiovascular system: S1 & S2 heard, RRR. No JVD, Gastrointestinal system: Abdomen is soft, mild to md tenderness in the lower quadrant. Central nervous system: Alert and oriented.  Extremities: Symmetric  5 x 5 power. Skin: No rashes,  Psychiatry:Mood & affect appropriate.     Data Reviewed:  I have personally reviewed following labs and imaging studies   CBC Lab Results  Component Value Date   WBC 14.7 (H) 11/17/2023   RBC 3.61 (L) 11/17/2023   HGB 10.5 (L) 11/17/2023   HCT 33.5 (L) 11/17/2023   MCV 92.8 11/17/2023   MCH 29.1 11/17/2023   PLT 320 11/17/2023   MCHC 31.3 11/17/2023   RDW 14.3 11/17/2023   LYMPHSABS 1.1 11/14/2023   MONOABS 0.4 11/14/2023   EOSABS 0.0 11/14/2023   BASOSABS 0.1 11/14/2023     Last metabolic panel Lab Results  Component Value Date   NA 140 11/17/2023   K 4.5 11/17/2023   CL 112 (H) 11/17/2023   CO2 19 (L) 11/17/2023   BUN 54 (H) 11/17/2023   CREATININE 1.80 (H) 11/17/2023   GLUCOSE 96 11/17/2023   GFRNONAA 30 (L) 11/17/2023   GFRAA 57 (L) 09/05/2020   CALCIUM 8.4 (L) 11/17/2023   PHOS 2.9 10/28/2020   PROT 7.6 11/14/2023   ALBUMIN 3.6 11/14/2023   LABGLOB 2.2 09/25/2023   AGRATIO 1.9 12/31/2022   BILITOT 0.5 11/14/2023   ALKPHOS 72 11/14/2023   AST 24 11/14/2023   ALT 15 11/14/2023   ANIONGAP 9 11/17/2023    CBG (last 3)  No results for input(s): "GLUCAP" in the last 72 hours.    Coagulation Profile: Recent Labs  Lab 11/15/23 0320  INR 1.1     Radiology Studies: CT PELVIS WO CONTRAST Result Date: 11/17/2023 CLINICAL DATA:  Bladder cancer to look for metastasis. EXAM: CT PELVIS WITHOUT CONTRAST TECHNIQUE: Multidetector CT imaging of the pelvis was performed following the standard protocol without intravenous contrast. RADIATION DOSE REDUCTION: This exam was  performed according to the departmental dose-optimization program which includes automated exposure control, adjustment of the mA and/or kV according to patient size and/or use of iterative reconstruction technique. COMPARISON:  CT abdomen and pelvis 10/25/2020 FINDINGS: Urinary Tract: Foley catheter decompresses the bladder. Distal left ureter appears dilated, possibly reflux or obstruction. No stones are demonstrated. Bowel: Visualized small and large bowel are not abnormally distended. Prominent diverticulosis of the sigmoid colon. No definitive inflammatory changes to suggest acute diverticulitis. Vascular/Lymphatic: Mild calcification in the iliac arteries. No significant lymphadenopathy. Reproductive:  Uterus and ovaries are not enlarged. Other: Nonspecific soft tissue thickening in the presacral perirectal fat. This could represent a hematoma in the setting of sacral fractures. See below. Alternatively, this could represent postradiation or postsurgical change if there has been surgery or radiation to this area. Alternatively, infectious, inflammatory, or neoplastic etiologies could be considered. No discrete mass or fluid collection. Musculoskeletal: Postoperative right hip arthroplasty. Degenerative changes in the lower lumbar spine. Nondisplaced fracture of the sacrum at the level of S2. This may represent a stress fracture or insufficiency fracture. Old appearing fracture deformity of the left inferior pubic ramus. IMPRESSION: 1. Foley catheter decompresses the bladder, limiting evaluation. 2. No lymphadenopathy or soft tissue mass identified. 3. Colonic diverticulosis without evidence of acute diverticulitis. 4. Nonspecific soft tissue thickening in the presacral space. Possibly etiologies include infectious, inflammatory, postoperative/postradiation, or posttraumatic change. 5. Nondisplaced fractures of the sacrum at the level of S2, possibly stress or insufficiency fracture. No destructive bone lesions  are identified. Electronically Signed   By: Burman Nieves M.D.   On: 11/17/2023 20:23   IR NEPHROSTOMY PLACEMENT RIGHT Result Date: 11/16/2023 INDICATION: 68 year old female with vaginal/bladder mass and right distal  ureteral obstruction. Cystoscopy in attempted ureteral stent placement was unsuccessful. Therefore, patient requires placement of a percutaneous nephrostomy tube. EXAM: IR NEPHROSTOMY PLACEMENT RIGHT COMPARISON:  None Available. MEDICATIONS: No additional antibiotics administered. ANESTHESIA/SEDATION: Fentanyl 2 mcg IV; Versed 100 mg IV administered by the radiology nurse Moderate Sedation Time:  10 minutes The patient's vital signs and level of consciousness were continuously monitored during the procedure by the interventional radiology nurse under my direct supervision. CONTRAST:  15mL OMNIPAQUE IOHEXOL 300 MG/ML SOLN - administered into the collecting system(s) FLUOROSCOPY: Radiation exposure index: 2 mGy reference air kerma COMPLICATIONS: None immediate. TECHNIQUE: The procedure, risks, benefits, and alternatives were explained to the patient. Questions regarding the procedure were encouraged and answered. The patient understands and consents to the procedure. The right flank was prepped with chlorhexidine in a sterile fashion, and a sterile drape was applied covering the operative field. A sterile gown and sterile gloves were used for the procedure. Local anesthesia was provided with 1% Lidocaine. The right flank was interrogated with ultrasound and the left kidney identified. The kidney is hydronephrotic. A suitable access site on the skin overlying the lower pole, posterior calix was identified. After local mg anesthesia was achieved, a small skin nick was made with an 11 blade scalpel. A 21 gauge Accustick needle was then advanced under direct sonographic guidance into the lower pole of the right kidney. A 0.018 inch wire was advanced under fluoroscopic guidance into the left renal  collecting system. The Accustick sheath was then advanced over the wire and a 0.018 system exchanged for a 0.035 system. Gentle hand injection of contrast material confirms placement of the sheath within the renal collecting system. There is severe hydronephrosis. The tract from the scan into the renal collecting system was then dilated serially to 10-French. A 10-French Cook all-purpose drain was then placed and positioned under fluoroscopic guidance. The locking loop is well formed within the left renal pelvis. The catheter was secured to the skin with 2-0 Prolene and a sterile bandage was placed. Catheter was left to gravity bag drainage. IMPRESSION: Successful placement of a right 10 French percutaneous nephrostomy tube. Electronically Signed   By: Malachy Moan M.D.   On: 11/16/2023 15:43       Kathlen Mody M.D. [facility] 11/18/2023, 1:09 PM  Available via Epic secure chat 7am-7pm After 7 pm, please refer to night coverage provider listed on amion.

## 2023-11-18 NOTE — Assessment & Plan Note (Signed)
 Improving weakness. 4/4 in LE today

## 2023-11-18 NOTE — Plan of Care (Signed)

## 2023-11-18 NOTE — Progress Notes (Signed)
 2 Days Post-Op Subjective: Pt is resting comfortably in bed. Pain improving.  Still having difficulty walking.   Objective: Vital signs in last 24 hours: Temp:  [97.9 F (36.6 C)-98.1 F (36.7 C)] 97.9 F (36.6 C) (03/25 0513) Pulse Rate:  [66-80] 66 (03/25 0513) Resp:  [11-18] 18 (03/25 0513) BP: (95-129)/(67-72) 115/72 (03/25 0513) SpO2:  [96 %-98 %] 98 % (03/25 0513)  Intake/Output from previous day: 03/24 0701 - 03/25 0700 In: 2021 [P.O.:340; I.V.:1676] Out: 470 [Urine:470]  Intake/Output this shift: Total I/O In: -  Out: 500 [Urine:500]  Physical Exam:  General: Alert and oriented GU:  Right PCN and Foley in place and draining amber urine Lab Results: Recent Labs    11/17/23 0350  HGB 10.5*  HCT 33.5*   BMET Recent Labs    11/16/23 0624 11/17/23 0350  NA 141 140  K 4.5 4.5  CL 111 112*  CO2 17* 19*  GLUCOSE 89 96  BUN 61* 54*  CREATININE 1.75* 1.80*  CALCIUM 8.4* 8.4*     Studies/Results: CT PELVIS WO CONTRAST Result Date: 11/17/2023 CLINICAL DATA:  Bladder cancer to look for metastasis. EXAM: CT PELVIS WITHOUT CONTRAST TECHNIQUE: Multidetector CT imaging of the pelvis was performed following the standard protocol without intravenous contrast. RADIATION DOSE REDUCTION: This exam was performed according to the departmental dose-optimization program which includes automated exposure control, adjustment of the mA and/or kV according to patient size and/or use of iterative reconstruction technique. COMPARISON:  CT abdomen and pelvis 10/25/2020 FINDINGS: Urinary Tract: Foley catheter decompresses the bladder. Distal left ureter appears dilated, possibly reflux or obstruction. No stones are demonstrated. Bowel: Visualized small and large bowel are not abnormally distended. Prominent diverticulosis of the sigmoid colon. No definitive inflammatory changes to suggest acute diverticulitis. Vascular/Lymphatic: Mild calcification in the iliac arteries. No significant  lymphadenopathy. Reproductive:  Uterus and ovaries are not enlarged. Other: Nonspecific soft tissue thickening in the presacral perirectal fat. This could represent a hematoma in the setting of sacral fractures. See below. Alternatively, this could represent postradiation or postsurgical change if there has been surgery or radiation to this area. Alternatively, infectious, inflammatory, or neoplastic etiologies could be considered. No discrete mass or fluid collection. Musculoskeletal: Postoperative right hip arthroplasty. Degenerative changes in the lower lumbar spine. Nondisplaced fracture of the sacrum at the level of S2. This may represent a stress fracture or insufficiency fracture. Old appearing fracture deformity of the left inferior pubic ramus. IMPRESSION: 1. Foley catheter decompresses the bladder, limiting evaluation. 2. No lymphadenopathy or soft tissue mass identified. 3. Colonic diverticulosis without evidence of acute diverticulitis. 4. Nonspecific soft tissue thickening in the presacral space. Possibly etiologies include infectious, inflammatory, postoperative/postradiation, or posttraumatic change. 5. Nondisplaced fractures of the sacrum at the level of S2, possibly stress or insufficiency fracture. No destructive bone lesions are identified. Electronically Signed   By: Burman Nieves M.D.   On: 11/17/2023 20:23   IR NEPHROSTOMY PLACEMENT RIGHT Result Date: 11/16/2023 INDICATION: 68 year old female with vaginal/bladder mass and right distal ureteral obstruction. Cystoscopy in attempted ureteral stent placement was unsuccessful. Therefore, patient requires placement of a percutaneous nephrostomy tube. EXAM: IR NEPHROSTOMY PLACEMENT RIGHT COMPARISON:  None Available. MEDICATIONS: No additional antibiotics administered. ANESTHESIA/SEDATION: Fentanyl 2 mcg IV; Versed 100 mg IV administered by the radiology nurse Moderate Sedation Time:  10 minutes The patient's vital signs and level of  consciousness were continuously monitored during the procedure by the interventional radiology nurse under my direct supervision. CONTRAST:  15mL OMNIPAQUE IOHEXOL 300  MG/ML SOLN - administered into the collecting system(s) FLUOROSCOPY: Radiation exposure index: 2 mGy reference air kerma COMPLICATIONS: None immediate. TECHNIQUE: The procedure, risks, benefits, and alternatives were explained to the patient. Questions regarding the procedure were encouraged and answered. The patient understands and consents to the procedure. The right flank was prepped with chlorhexidine in a sterile fashion, and a sterile drape was applied covering the operative field. A sterile gown and sterile gloves were used for the procedure. Local anesthesia was provided with 1% Lidocaine. The right flank was interrogated with ultrasound and the left kidney identified. The kidney is hydronephrotic. A suitable access site on the skin overlying the lower pole, posterior calix was identified. After local mg anesthesia was achieved, a small skin nick was made with an 11 blade scalpel. A 21 gauge Accustick needle was then advanced under direct sonographic guidance into the lower pole of the right kidney. A 0.018 inch wire was advanced under fluoroscopic guidance into the left renal collecting system. The Accustick sheath was then advanced over the wire and a 0.018 system exchanged for a 0.035 system. Gentle hand injection of contrast material confirms placement of the sheath within the renal collecting system. There is severe hydronephrosis. The tract from the scan into the renal collecting system was then dilated serially to 10-French. A 10-French Cook all-purpose drain was then placed and positioned under fluoroscopic guidance. The locking loop is well formed within the left renal pelvis. The catheter was secured to the skin with 2-0 Prolene and a sterile bandage was placed. Catheter was left to gravity bag drainage. IMPRESSION: Successful  placement of a right 10 French percutaneous nephrostomy tube. Electronically Signed   By: Malachy Moan M.D.   On: 11/16/2023 15:43   DG C-Arm 1-60 Min-No Report Result Date: 11/16/2023 Fluoroscopy was utilized by the requesting physician.  No radiographic interpretation.   DG C-Arm 1-60 Min-No Report Result Date: 11/16/2023 Fluoroscopy was utilized by the requesting physician.  No radiographic interpretation.    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  -Will discuss obtaining PET CT to complete her staging and discuss palliative chemotherapy options with Dr. Cherly Hensen   LOS: 4 days   Rhoderick Moody, MD [facility] Pager: [phone]  11/18/2023, 10:43 AM

## 2023-11-18 NOTE — Anesthesia Postprocedure Evaluation (Signed)
 Anesthesia Post Note  Patient: [name]  Procedure(s) Performed: TURBT (TRANSURETHRAL RESECTION OF BLADDER TUMOR) (Bladder)     Patient location during evaluation: PACU Anesthesia Type: General Level of consciousness: awake and alert Pain management: pain level controlled Vital Signs Assessment: post-procedure vital signs reviewed and stable Respiratory status: spontaneous breathing, nonlabored ventilation, respiratory function stable and patient connected to nasal cannula oxygen Cardiovascular status: blood pressure returned to baseline and stable Postop Assessment: no apparent nausea or vomiting Anesthetic complications: no   No notable events documented.  Last Vitals:  Vitals:   11/18/23 0513 11/18/23 1314  BP: 115/72 122/70  Pulse: 66 76  Resp: 18 18  Temp: 36.6 C 36.5 C  SpO2: 98% 100%    Last Pain:  Vitals:   11/18/23 1314  TempSrc: Oral  PainSc:                  Hagan Vanauken S

## 2023-11-18 NOTE — Plan of Care (Signed)

## 2023-11-18 NOTE — TOC Progression Note (Signed)
 Transition of Care [facility]) - Progression Note    Patient Details  Name: [name] MRN: [id number] Date of Birth: 11-30-1955  [facility] CM/SW Contact  Larrie Kass, LCSW Phone Number: 11/18/2023, 11:00 AM  Clinical Narrative:    CSW presented bed offers , she is requesting time to review. Pt is requesting to reach out to facilities in [place] to inquire about bed availability as well. TOC to follow.    Expected Discharge Plan: Skilled Nursing Facility Barriers to Discharge: Continued Medical Work up  Expected Discharge Plan and Services       Living arrangements for the past 2 months: Single Family Home                                       Social Determinants of Health (SDOH) Interventions SDOH Screenings   Food Insecurity: No Food Insecurity (11/15/2023)  Housing: Low Risk  (11/15/2023)  Transportation Needs: Unmet Transportation Needs (11/15/2023)  Utilities: Not At Risk (11/15/2023)  Alcohol Screen: Low Risk  (12/31/2022)  Depression (PHQ2-9): High Risk (09/25/2023)  Financial Resource Strain: Low Risk  (04/05/2023)  Physical Activity: Insufficiently Active (04/05/2023)  Social Connections: Moderately Isolated (11/15/2023)  Stress: Stress Concern Present (04/05/2023)  Tobacco Use: Medium Risk (11/16/2023)  Health Literacy: Adequate Health Literacy (04/07/2023)    Readmission Risk Interventions     No data to display

## 2023-11-18 NOTE — Progress Notes (Signed)
 GYN Oncology Progress Note  Patient is alert, oriented, resting in bed, in no acute distress, receiving IV pain medicine from RN at this time, with son at the bedside.  She states yesterday afternoon and evening was rough for her related to moderate pain but states overall she feels the pain is getting better.  The medication has been helping.  No nausea or emesis reported.  She worked with physical therapy yesterday with turning in the bed but did not get out of bed.  Discussed surgical pathology results from procedure with Dr. Liliane Shi.  Advised patient she would be seeing Dr. Liliane Shi to discuss further.   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 Alvester Morin for this afternoon is no longer necessary and has been cancelled. Primary team and Medical Oncology Team notified.

## 2023-11-18 NOTE — Consult Note (Addendum)
 [facility] Hematology and oncology consult note   Patient Care Team: [name], FNP as PCP - General (Family Medicine) Regan Lemming, MD as PCP - Electrophysiology (Cardiology) Georgeanna Lea, MD as PCP - Cardiology (Cardiology) Tressie Stalker, MD as Consulting Physician (Neurosurgery) Georgeanna Lea, MD as Consulting Physician (Cardiology) Karie Soda, MD as Consulting Physician (General Surgery) Drema Dallas, DO as Consulting Physician (Neurology) Earvin Hansen, [facility] (Inactive) as Pharmacist (Pharmacist) Kerin Salen, MD as Consulting Physician (Gastroenterology) Zehr, Princella Pellegrini, PA-C as Physician Assistant (Gastroenterology) Anthony Sar, MD as Consulting Physician (Nephrology)   ASSESSMENT & PLAN:  68 y.o.female with past medical history of COPD presenting with back pain radiating down to the hip 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  We are consulted for this reason.  CT showed T5, T7, L1, S2 compression fracture and sclerotic lesion T2, T3.  Cystoscopy with TURBT show right sided bladder tumor with palpable anterior vaginal mass.  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  Discussed with patient today. Staging is unclear. Her compression fractures seems to be from long standing osteoporosis she is aware of. No significant sclerotic lesions reported other than T2/3. Will see if we can get a biopsy. She lives in [place]. Otherwise she has good PS prior to last week. Assessment & Plan Urothelial carcinoma of bladder (HCC) Newly diagnosed. Staging not entirely clear with a few sclerotic lesion. Will obtain bone scan.  If T2/3 or other sclerotic lesion are accessible, recommend biopsy for diagnosis.  AKI (acute kidney injury) (HCC) Improved after nephrostomy tube and TURBT Continue adequate hydration Paraplegia (HCC) Improving weakness. 4/4 in LE today  Bone lesion Unclear etiology.  Recommend biopsy for  diagnosis.  Compression fractures Secondary to osteoporosis  Thank you for the consult. Will follow up.   Melven Sartorius, MD 11/18/2023 3:36 PM   CHIEF COMPLAINTS/PURPOSE OF ADMISSION [facility]  HISTORY OF PRESENTING ILLNESS:  [name] 68 y.o. female consulted for newly diagnosed bladder cancer. Patient is admitted for low back pain.  From records report CT from 3//25 show:  localized wall thickening of the posterior right urinary bladder concerning for primary bladder neoplasm, and associated obstruction of the right UVJ with high grade right hydronephrosis.  He has sclerotic changes in the left superior pubic ramus/symphysis concerning for metastases.  Age indeterminant subacute/chronic compression fracture at T7 which appeared to be new from 2022.  Report worsening lower back pain earlier this month attributed to degenerative spondylosis.  She was initially referred to urology but presented to ED/21/24 due to sudden increase in low back pain.  She had bilateral lower extremity weakness inability to ambulate.  Imaging from the emergency room show compression fractures at T1, and chronic and stable T5 and T7 compression fractures.  Multiple disc bulging L3-L4 and S1.  CT myelography of T and lumbar spine on 3/22 showed sclerotic lesion on the left aspect of T2 and T3 extending into the left pedicle at T3 concerning for metastases.  Acute compression at T7 and T5, L1.  Her case was reviewed at tumor board on 3/24 and thought her symptoms were not related to spinal cord compression from metastatic disease.  Patient undergoing TURBT and pathology showed urothelial carcinoma of squamous differentiation (40%).  Summary of oncologic history as follows: Oncology History   No history exists.    MEDICAL HISTORY:  Past Medical History:  Diagnosis Date   Abdominal pain 10/17/2020   Abnormality of gait due to  impairment of balance 11/22/2020   Acute diverticulitis 10/17/2020   Admission for  long-term opiate analgesic use 10/24/2019   Arthritis of right hip 05/29/2016   Formatting of this note might be different from the original. Added automatically from request for surgery [id number]   BMI 26.0-26.9,adult 03/29/2020   Bronchial asthma    Cardiomyopathy (HCC)    Overview:  Ejection fraction 45% in 2015 Ejection fraction 30 to 35% in November 2018   Chronic back pain    Chronic constipation 07/31/2021   Chronic hypoxemic respiratory failure (HCC) 10/24/2019   Chronic narcotic use 03/23/2015   Chronic pain of both knees 12/21/2018   Added automatically from request for surgery [id number]  Formatting of this note might be different from the original. 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   Hypotension 05/17/2020   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   Ileus following gastrointestinal surgery (HCC) 03/26/2015   Major depressive disorder, single episode, moderate (HCC) 10/24/2019   Malnutrition of moderate degree (HCC) 10/20/2020   Mesenteric ischemia (HCC) 10/16/2020   Migraine without aura with status migrainosus 10/24/2019   Mixed incontinence 10/20/2020   Myocardial infarction (HCC)    Nausea & vomiting     Obstructive chronic bronchitis with exacerbation (HCC) 10/25/2019   Other spondylosis with radiculopathy, lumbar region 10/24/2019   Persistent vomiting 05/17/2020   Presence of left artificial hip joint 10/24/2019   PTSD (post-traumatic stress disorder)    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 03/23/2015 04/19/2014   Senile osteoporosis 10/24/2019   Sepsis (HCC) 10/17/2020   Serosanguineous chronic otitis media of right ear 08/02/2021   Small bowel obstruction (HCC)    Thyroid disease    Upper respiratory tract infection due to COVID-19 virus 07/06/2021   Wellness examination 03/27/2021    SURGICAL HISTORY: Past Surgical History:  Procedure Laterality Date   APPENDECTOMY     CARDIAC CATHETERIZATION     CARPAL TUNNEL RELEASE     CESAREAN SECTION     CHF s/p AICD   12/2010   CHOLECYSTECTOMY     COLONOSCOPY  10/16/2015   Mild colonic diverticulosis, predominantly in the left colon. Small internal hemrrhoids. Otherwise normal colonoscopy   COLONOSCOPY WITH PROPOFOL N/A 09/27/2021   Procedure: COLONOSCOPY WITH PROPOFOL;  Surgeon: Napoleon Form, MD;  Location: [facility];  Service: Endoscopy;  Laterality: N/A;   CORONARY ANGIOPLASTY     ESOPHAGOGASTRODUODENOSCOPY  02/04/2014   Mild gastritis. Otherwise normal EGD   HAND SURGERY Bilateral    ICD GENERATOR CHANGEOUT N/A 05/21/2019   Procedure: ICD GENERATOR CHANGEOUT;  Surgeon: Regan Lemming, MD;  Location: [facility] INVASIVE CV LAB;  Service: Cardiovascular;  Laterality: N/A;   ICD IMPLANT     Medtronic   intestinal blockage 2011     IR NEPHROSTOMY PLACEMENT RIGHT  11/16/2023   LAPAROSCOPIC ASSISTED VENTRAL HERNIA REPAIR N/A 03/23/2015   Procedure: LAPAROSCOPIC VENTRAL WALL HERNIA REPAIR;  Surgeon: Karie Soda, MD;  Location: [facility] ORS;  Service: General;  Laterality: N/A;  With MESH   LAPAROSCOPIC LYSIS OF ADHESIONS N/A 03/23/2015   Procedure: LAPAROSCOPIC LYSIS OF ADHESIONS;  Surgeon: Karie Soda, MD;  Location: [facility];  Service: General;  Laterality: N/A;   LSCS      x2   NASAL SEPTUM SURGERY     NECK SURGERY     fused   TONSILLECTOMY     TRANSURETHRAL RESECTION OF BLADDER TUMOR N/A 11/16/2023   Procedure: TURBT (TRANSURETHRAL RESECTION OF BLADDER TUMOR);  Surgeon: Rene Paci, MD;  Location: [facility];  Service: Urology;  Laterality: N/A;   TYMPANOSTOMY TUBE PLACEMENT Right 2024   ULNAR NERVE TRANSPOSITION  01/23/2012   Procedure: ULNAR NERVE DECOMPRESSION/TRANSPOSITION;  Surgeon: Cristi Loron, MD;  Location: [facility] OR[facility];  Service: Neurosurgery;  Laterality: Left;  LEFT ulnar nerve decompression    SOCIAL HISTORY: Social History   Socioeconomic History   Marital status: Single    Spouse name: Not on file   Number of children: 3   Years of education: Not on file   Highest education level: 10th grade  Occupational History   Occupation: disabled  Tobacco Use   Smoking status: Former    Current packs/day: 0.00    Average packs/day: 0.5 packs/day for 30.0 years (15.0 ttl pk-yrs)    Types: Cigarettes    Start date: 03/1990    Quit date: 03/2020    Years since quitting: 3.6   Smokeless tobacco: Never  Vaping Use   Vaping status: Never Used  Substance and Sexual Activity   Alcohol use: Yes    Alcohol/week: 2.0 standard drinks of alcohol    Types: 1 Cans of beer, 1 Standard drinks or equivalent per week    Comment: seldom   Drug use: No   Sexual activity: Not Currently  Other Topics Concern   Not on file  Social History Narrative   One level home with boyfriend   Caffeine - coffee 1-2 cups/day; Green tea 4-5 bottles a day   Exercise - some    Right handed      Social Drivers of Health   Financial Resource Strain: Low Risk  (04/05/2023)   Overall Financial Resource Strain (CARDIA)    Difficulty of Paying Living Expenses: Not very hard  Food Insecurity: No Food Insecurity (11/15/2023)   Hunger Vital Sign    Worried About Running Out of  Food in the Last Year: Never true    Ran Out of Food in the Last Year: Never true  Transportation Needs: Unmet Transportation Needs (11/15/2023)   PRAPARE - Administrator, Civil Service (Medical): Yes    Lack of Transportation (Non-Medical): No  Physical Activity: Insufficiently Active (04/05/2023)   Exercise Vital Sign    Days of Exercise per Week: 3 days    Minutes of Exercise per Session: 20 min  Stress: Stress Concern Present (04/05/2023)   [employer] of Occupational Health - Occupational Stress Questionnaire    Feeling of Stress : Very much  Social Connections: Moderately Isolated (11/15/2023)   Social Connection and Isolation Panel [NHANES]    Frequency of Communication with Friends and Family: More than three times a week    Frequency of Social Gatherings with Friends and Family: More than three times a week    Attends Religious Services: 1 to  4 times per year    Active Member of Clubs or Organizations: No    Attends Banker Meetings: Never    Marital Status: Divorced  Catering manager Violence: Not At Risk (11/15/2023)   Humiliation, Afraid, Rape, and Kick questionnaire    Fear of Current or Ex-Partner: No    Emotionally Abused: No    Physically Abused: No    Sexually Abused: No    FAMILY HISTORY: Family History  Problem Relation Age of Onset   Hypertension Mother    Heart attack Mother    Alcohol abuse Father    Hypertension Father    Heart attack Father    Heart attack Other    Cancer Other    Heart failure Other    Anesthesia problems Neg Hx    Hypotension Neg Hx    Malignant hyperthermia Neg Hx    Pseudochol deficiency Neg Hx    Breast cancer Neg Hx     ALLERGIES:  has no known allergies.  MEDICATIONS:  Current Facility-Administered Medications  Medication Dose Route Frequency Provider Last Rate Last Admin   0.9 %  sodium chloride infusion   Intravenous Continuous Winter, Christopher Aaron, MD   Stopped at 11/18/23 1405    acetaminophen (TYLENOL) tablet 650 mg  650 mg Oral Q6H PRN Rene Paci, MD   650 mg at 11/18/23 4010   Or   acetaminophen (TYLENOL) suppository 650 mg  650 mg Rectal Q6H PRN Rene Paci, MD       ARIPiprazole (ABILIFY) tablet 5 mg  5 mg Oral Daily Kathlen Mody, MD   5 mg at 11/18/23 1042   Buprenorphine HCl FILM 900 mcg  900 mcg Buccal Q12H Kathlen Mody, MD       Chlorhexidine Gluconate Cloth 2 % PADS 6 each  6 each Topical Daily Winter, Christopher Aaron, MD   6 each at 11/18/23 1314   dexamethasone (DECADRON) injection 6 mg  6 mg Intravenous Q24H Winter, Christopher Aaron, MD   6 mg at 11/17/23 1643   fluticasone (FLONASE) 50 MCG/ACT nasal spray 2 spray  2 spray Each Nare Daily PRN Kathlen Mody, MD       fluticasone furoate-vilanterol (BREO ELLIPTA) 100-25 MCG/ACT 1 puff  1 puff Inhalation Daily Winter, Christopher Aaron, MD   1 puff at 11/18/23 0845   HYDROmorphone (DILAUDID) injection 0.5 mg  0.5 mg Intravenous Q2H PRN Rene Paci, MD   0.5 mg at 11/18/23 1258   ipratropium-albuterol (DUONEB) 0.5-2.5 (3) MG/3ML nebulizer solution 3 mL  3 mL Nebulization Q4H PRN Rene Paci, MD       lidocaine (LIDODERM) 5 % 1 patch  1 patch Transdermal Q24H Winter, Christopher Aaron, MD   1 patch at 11/17/23 1644   naloxone [facility]) injection 0.4 mg  0.4 mg Intravenous PRN Winter, Christopher Aaron, MD       oxyCODONE (Oxy IR/ROXICODONE) immediate release tablet 5 mg  5 mg Oral Q4H PRN Rene Paci, MD   5 mg at 11/18/23 0844   pantoprazole (PROTONIX) EC tablet 40 mg  40 mg Oral Daily Kathlen Mody, MD   40 mg at 11/18/23 1042   polyethylene glycol (MIRALAX / GLYCOLAX) packet 17 g  17 g Oral Daily Rene Paci, MD   17 g at 11/18/23 1046   ranolazine (RANEXA) 12 hr tablet 500 mg  500 mg Oral BID Kathlen Mody, MD   500 mg at 11/18/23 1046   rosuvastatin (CRESTOR) tablet 20 mg  20 mg Oral Daily Kathlen Mody, MD   20 mg at  11/18/23 1042   senna-docusate (Senokot-S) tablet 2 tablet  2 tablet Oral BID Rene Paci, MD   2 tablet at 11/18/23 1046   sertraline (ZOLOFT) tablet 50 mg  50 mg Oral Daily Kathlen Mody, MD   50 mg at 11/18/23 1042   sodium bicarbonate tablet 650 mg  650 mg Oral BID Rene Paci, MD   650 mg at 11/18/23 1042   sodium chloride flush (NS) 0.9 % injection 3 mL  3 mL Intravenous Q12H Rene Paci, MD   3 mL at 11/18/23 1049   sodium chloride flush (NS) 0.9 % injection 3-10 mL  3-10 mL Intravenous Q12H Lilia Pro, MD   10 mL at 11/18/23 1049    REVIEW OF SYSTEMS:   Decreased urination for about two weeks. Report of sudden low back pain and weakness. No neuropathy.  No chest pain, coughing, short of breath, nausea, vomiting, diarrhea, bloody stool, bloody urine, new headaches.  PHYSICAL EXAMINATION: ECOG PERFORMANCE STATUS: 2 - Symptomatic, <50% confined to bed  Vitals:   11/18/23 0513 11/18/23 1314  BP: 115/72 122/70  Pulse: 66 76  Resp: 18 18  Temp: 97.9 F (36.6 C) 97.7 F (36.5 C)  SpO2: 98% 100%   Filed Weights   11/14/23 1415 11/16/23 1231  Weight: 147 lb 11.3 oz (67 kg) 161 lb 6 oz (73.2 kg)    GENERAL:alert, no distress and comfortable SKIN: skin color normal. No jaundice EYES: normal, sclera clear OROPHARYNX: no exudate, moist NECK: supple. No mass LYMPH:  no palpable cervical lymphadenopathy LUNGS: clear to auscultation and normal breathing effort.  No wheeze or rales HEART: regular rate & rhythm and no murmurs ABDOMEN:abdomen soft, non-tender and normal bowel sounds Right nephrostomy tube Musculoskeletal:  no lower extremity edema NEURO: alert & oriented with fluent speech; no focal motor/sensory deficits Strength and sensation equal bilaterally. 4/5 in lower extremities  LABORATORY DATA:  I have reviewed the data as listed Lab Results  Component Value Date   WBC 14.7 (H) 11/17/2023   HGB 10.5 (L) 11/17/2023    HCT 33.5 (L) 11/17/2023   MCV 92.8 11/17/2023   PLT 320 11/17/2023   Recent Labs    06/23/23 1516 09/25/23 1449 11/14/23 1649 11/15/23 0320 11/16/23 0624 11/17/23 0350 11/18/23 1331  NA 142 139 135   < > 141 140 140  K 5.2 4.6 4.5   < > 4.5 4.5 4.4  CL 109* 108* 101   < > 111 112* 112*  CO2 20 16* 19*   < > 17* 19* 20*  GLUCOSE 88 131* 147*   < > 89 96 90  BUN 20 32* 83*   < > 61* 54* 45*  CREATININE 1.76* 1.76* 2.79*   < > 1.75* 1.80* 1.83*  CALCIUM 9.7 9.9 9.5   < > 8.4* 8.4* 8.5*  GFRNONAA  --   --  18*   < > 31* 30* 30*  PROT 6.6 6.5 7.6  --   --   --   --   ALBUMIN 4.2 4.3 3.6  --   --   --   --   AST 13 12 24   --   --   --   --   ALT 5 7 15   --   --   --   --   ALKPHOS 63 78 72  --   --   --   --  BILITOT 0.3 0.3 0.5  --   --   --   --    < > = values in this interval not displayed.    RADIOGRAPHIC STUDIES: I have personally reviewed the radiological images as listed and agreed with the findings in the report. CT PELVIS WO CONTRAST Result Date: 11/17/2023 CLINICAL DATA:  Bladder cancer to look for metastasis. EXAM: CT PELVIS WITHOUT CONTRAST TECHNIQUE: Multidetector CT imaging of the pelvis was performed following the standard protocol without intravenous contrast. RADIATION DOSE REDUCTION: This exam was performed according to the departmental dose-optimization program which includes automated exposure control, adjustment of the mA and/or kV according to patient size and/or use of iterative reconstruction technique. COMPARISON:  CT abdomen and pelvis 10/25/2020 FINDINGS: Urinary Tract: Foley catheter decompresses the bladder. Distal left ureter appears dilated, possibly reflux or obstruction. No stones are demonstrated. Bowel: Visualized small and large bowel are not abnormally distended. Prominent diverticulosis of the sigmoid colon. No definitive inflammatory changes to suggest acute diverticulitis. Vascular/Lymphatic: Mild calcification in the iliac arteries. No  significant lymphadenopathy. Reproductive:  Uterus and ovaries are not enlarged. Other: Nonspecific soft tissue thickening in the presacral perirectal fat. This could represent a hematoma in the setting of sacral fractures. See below. Alternatively, this could represent postradiation or postsurgical change if there has been surgery or radiation to this area. Alternatively, infectious, inflammatory, or neoplastic etiologies could be considered. No discrete mass or fluid collection. Musculoskeletal: Postoperative right hip arthroplasty. Degenerative changes in the lower lumbar spine. Nondisplaced fracture of the sacrum at the level of S2. This may represent a stress fracture or insufficiency fracture. Old appearing fracture deformity of the left inferior pubic ramus. IMPRESSION: 1. Foley catheter decompresses the bladder, limiting evaluation. 2. No lymphadenopathy or soft tissue mass identified. 3. Colonic diverticulosis without evidence of acute diverticulitis. 4. Nonspecific soft tissue thickening in the presacral space. Possibly etiologies include infectious, inflammatory, postoperative/postradiation, or posttraumatic change. 5. Nondisplaced fractures of the sacrum at the level of S2, possibly stress or insufficiency fracture. No destructive bone lesions are identified. Electronically Signed   By: Burman Nieves M.D.   On: 11/17/2023 20:23   IR NEPHROSTOMY PLACEMENT RIGHT Result Date: 11/16/2023 INDICATION: 68 year old female with vaginal/bladder mass and right distal ureteral obstruction. Cystoscopy in attempted ureteral stent placement was unsuccessful. Therefore, patient requires placement of a percutaneous nephrostomy tube. EXAM: IR NEPHROSTOMY PLACEMENT RIGHT COMPARISON:  None Available. MEDICATIONS: No additional antibiotics administered. ANESTHESIA/SEDATION: Fentanyl 2 mcg IV; Versed 100 mg IV administered by the radiology nurse Moderate Sedation Time:  10 minutes The patient's vital signs and level of  consciousness were continuously monitored during the procedure by the interventional radiology nurse under my direct supervision. CONTRAST:  15mL OMNIPAQUE IOHEXOL 300 MG/ML SOLN - administered into the collecting system(s) FLUOROSCOPY: Radiation exposure index: 2 mGy reference air kerma COMPLICATIONS: None immediate. TECHNIQUE: The procedure, risks, benefits, and alternatives were explained to the patient. Questions regarding the procedure were encouraged and answered. The patient understands and consents to the procedure. The right flank was prepped with chlorhexidine in a sterile fashion, and a sterile drape was applied covering the operative field. A sterile gown and sterile gloves were used for the procedure. Local anesthesia was provided with 1% Lidocaine. The right flank was interrogated with ultrasound and the left kidney identified. The kidney is hydronephrotic. A suitable access site on the skin overlying the lower pole, posterior calix was identified. After local mg anesthesia was achieved, a small skin nick was made with an  11 blade scalpel. A 21 gauge Accustick needle was then advanced under direct sonographic guidance into the lower pole of the right kidney. A 0.018 inch wire was advanced under fluoroscopic guidance into the left renal collecting system. The Accustick sheath was then advanced over the wire and a 0.018 system exchanged for a 0.035 system. Gentle hand injection of contrast material confirms placement of the sheath within the renal collecting system. There is severe hydronephrosis. The tract from the scan into the renal collecting system was then dilated serially to 10-French. A 10-French Cook all-purpose drain was then placed and positioned under fluoroscopic guidance. The locking loop is well formed within the left renal pelvis. The catheter was secured to the skin with 2-0 Prolene and a sterile bandage was placed. Catheter was left to gravity bag drainage. IMPRESSION: Successful  placement of a right 10 French percutaneous nephrostomy tube. Electronically Signed   By: Malachy Moan M.D.   On: 11/16/2023 15:43   DG C-Arm 1-60 Min-No Report Result Date: 11/16/2023 Fluoroscopy was utilized by the requesting physician.  No radiographic interpretation.   DG C-Arm 1-60 Min-No Report Result Date: 11/16/2023 Fluoroscopy was utilized by the requesting physician.  No radiographic interpretation.   CT THORACIC SPINE WO CONTRAST Result Date: 11/15/2023 CLINICAL DATA:  Back pain. Lower extremity weakness. Question cauda acquired syndrome. EXAM: CT MYELOGRAPHY LUMBAR SPINE TECHNIQUE: CT imaging of the thoracic and lumbar spine was performed after Isovue 300M contrast administration. Multiplanar CT image reconstructions were also generated. RADIATION DOSE REDUCTION: This exam was performed according to the departmental dose-optimization program which includes automated exposure control, adjustment of the mA and/or kV according to patient size and/or use of iterative reconstruction technique. COMPARISON:  CT of the thoracic and lumbar spine 11/14/2023. FINDINGS: Thoracic spine Alignment: No significant listhesis is present. Mild rightward curvature is centered at T6. Mild straightening of the normal thoracic kyphosis is stable. Vertebrae: The superior endplate compression fracture at L1 is more severe on the left. Slight retropulsed bone is present without focal stenosis. The superior endplate fracture at T5 demonstrates 30-40% loss of height. No retropulsed bone is present. This may be subacute. Superior endplate fracture at T7 is more acute, with 40-50% loss of height. Slight retropulsed bone is present without significant stenosis. Progressive collapse is present at this level. Sclerotic lesions are present on the left at T2 and T3 extending into the left pedicle at T3. Conus medullaris: Extends to the  level and appears normal. Paraspinal and other soft tissues: The paraspinous soft tissues  are within normal limits. The visualized lung fields are clear. Pacing wires are in place. Severe right-sided hydronephrosis is again noted. Disc levels: No significant central canal stenosis or cord compression is present. Mild right foraminal narrowing present at T8-9, T9-10 and T10-11. Mild left foraminal narrowing present at T5-6 and T6-7. Lumbar spine Segmentation: 5 non rib-bearing lumbar type vertebral bodies are present. The lowest fully formed vertebral body is L5. Alignment: No significant listhesis is present. Levoconvex curvature is centered at L1-2. Mild straightening of the normal lumbar lordosis is present. Vertebrae: Vertebral body heights are normal. No acute or healing fractures are present. Conus medullaris: Extends to the T12-L1   Level and appears normal. Paraspinal and other soft tissues: Atherosclerotic calcifications are present the aorta and branch vessels. No adenopathy is present. Severe right-sided hydronephrosis is again noted. Disc levels: L1-2: Normal disc signal and height is present. No focal protrusion or stenosis is present. L2-3: Normal disc signal and height is  present. No focal protrusion or stenosis is present. L3-4: A mild broad-based disc bulge is present. Mild facet hypertrophy is noted bilaterally. Disc material extends into the foramina bilaterally. Mild right foraminal narrowing is present. L4-5: A leftward disc protrusion is present. Moderate left subarticular and foraminal narrowing is present. Mild right foraminal narrowing is present. L5-S1: Chronic loss of disc height is present. A leftward disc protrusion likely contacts the traversing left S1 nerve roots. Facet spurring contributes to mild foraminal narrowing bilaterally, left greater than right. IMPRESSION: 1. No significant cord compression or central canal stenosis to explain the patient's symptoms. 2. Acute superior endplate compression fracture at T7 with 40-50% loss of height. Slight retropulsed bone is  present without significant stenosis. 3. Superior endplate compression fracture at T5 with 30-40% loss of height. No retropulsed bone is present. This may be subacute. 4. Superior endplate compression fracture at L1 is more severe on the left. Slight retropulsed bone is present without focal stenosis. 5. Sclerotic lesions on the left at T2 and T3 extending into the left pedicle at T3. These are concerning for metastatic disease. 6. Mild right foraminal narrowing at T8-9, T9-10 and T10-11. 7. Mild left foraminal narrowing at T5-6 and T6-7. 8. Moderate left subarticular and foraminal narrowing at L4-5. 9. Mild foraminal narrowing bilaterally at L5-S1, left greater than right. 10. Severe right-sided hydronephrosis is again noted. 11.  Aortic Atherosclerosis (ICD10-I70.0). These results were called by telephone at the time of interpretation on 11/15/2023 at 1:50 pm to Dr. Coletta Memos, Who verbally acknowledged these results. Electronically Signed   By: Marin Roberts M.D.   On: 11/15/2023 15:31   CT LUMBAR SPINE WO CONTRAST Result Date: 11/15/2023 CLINICAL DATA:  Back pain. Lower extremity weakness. Question cauda acquired syndrome. EXAM: CT MYELOGRAPHY LUMBAR SPINE TECHNIQUE: CT imaging of the thoracic and lumbar spine was performed after Isovue 300M contrast administration. Multiplanar CT image reconstructions were also generated. RADIATION DOSE REDUCTION: This exam was performed according to the departmental dose-optimization program which includes automated exposure control, adjustment of the mA and/or kV according to patient size and/or use of iterative reconstruction technique. COMPARISON:  CT of the thoracic and lumbar spine 11/14/2023. FINDINGS: Thoracic spine Alignment: No significant listhesis is present. Mild rightward curvature is centered at T6. Mild straightening of the normal thoracic kyphosis is stable. Vertebrae: The superior endplate compression fracture at L1 is more severe on the left.  Slight retropulsed bone is present without focal stenosis. The superior endplate fracture at T5 demonstrates 30-40% loss of height. No retropulsed bone is present. This may be subacute. Superior endplate fracture at T7 is more acute, with 40-50% loss of height. Slight retropulsed bone is present without significant stenosis. Progressive collapse is present at this level. Sclerotic lesions are present on the left at T2 and T3 extending into the left pedicle at T3. Conus medullaris: Extends to the  level and appears normal. Paraspinal and other soft tissues: The paraspinous soft tissues are within normal limits. The visualized lung fields are clear. Pacing wires are in place. Severe right-sided hydronephrosis is again noted. Disc levels: No significant central canal stenosis or cord compression is present. Mild right foraminal narrowing present at T8-9, T9-10 and T10-11. Mild left foraminal narrowing present at T5-6 and T6-7. Lumbar spine Segmentation: 5 non rib-bearing lumbar type vertebral bodies are present. The lowest fully formed vertebral body is L5. Alignment: No significant listhesis is present. Levoconvex curvature is centered at L1-2. Mild straightening of the normal lumbar lordosis is present.  Vertebrae: Vertebral body heights are normal. No acute or healing fractures are present. Conus medullaris: Extends to the T12-L1   Level and appears normal. Paraspinal and other soft tissues: Atherosclerotic calcifications are present the aorta and branch vessels. No adenopathy is present. Severe right-sided hydronephrosis is again noted. Disc levels: L1-2: Normal disc signal and height is present. No focal protrusion or stenosis is present. L2-3: Normal disc signal and height is present. No focal protrusion or stenosis is present. L3-4: A mild broad-based disc bulge is present. Mild facet hypertrophy is noted bilaterally. Disc material extends into the foramina bilaterally. Mild right foraminal narrowing is present.  L4-5: A leftward disc protrusion is present. Moderate left subarticular and foraminal narrowing is present. Mild right foraminal narrowing is present. L5-S1: Chronic loss of disc height is present. A leftward disc protrusion likely contacts the traversing left S1 nerve roots. Facet spurring contributes to mild foraminal narrowing bilaterally, left greater than right. IMPRESSION: 1. No significant cord compression or central canal stenosis to explain the patient's symptoms. 2. Acute superior endplate compression fracture at T7 with 40-50% loss of height. Slight retropulsed bone is present without significant stenosis. 3. Superior endplate compression fracture at T5 with 30-40% loss of height. No retropulsed bone is present. This may be subacute. 4. Superior endplate compression fracture at L1 is more severe on the left. Slight retropulsed bone is present without focal stenosis. 5. Sclerotic lesions on the left at T2 and T3 extending into the left pedicle at T3. These are concerning for metastatic disease. 6. Mild right foraminal narrowing at T8-9, T9-10 and T10-11. 7. Mild left foraminal narrowing at T5-6 and T6-7. 8. Moderate left subarticular and foraminal narrowing at L4-5. 9. Mild foraminal narrowing bilaterally at L5-S1, left greater than right. 10. Severe right-sided hydronephrosis is again noted. 11.  Aortic Atherosclerosis (ICD10-I70.0). These results were called by telephone at the time of interpretation on 11/15/2023 at 1:50 pm to Dr. Coletta Memos, Who verbally acknowledged these results. Electronically Signed   By: Marin Roberts M.D.   On: 11/15/2023 15:31   DG MYELOGRAPHY LUMBAR INJ MULTI REGION Result Date: 11/15/2023 CLINICAL DATA:  68 year old female with history of back pain, lower extremity weakness. Contrast injection for CT myelogram requested. EXAM: LUMBAR MYELOGRAM FLUOROSCOPY: dictate in minutes and seconds PROCEDURE: After thorough discussion of risks and benefits of the procedure  including bleeding, infection, injury to nerves, blood vessels, adjacent structures as well as headache and CSF leak, written and oral informed consent was obtained. Time out form was completed. Patient was positioned prone on the fluoroscopy table. Local anesthesia was provided with 1% lidocaine without epinephrine after prepped and draped in the usual sterile fashion. Puncture was performed at L2-L3 using a 3 1/2 inch 20-gauge spinal needle via left approach. Using a single pass through the dura, the needle was placed within the thecal sac, with return of clear CSF. 15 mL of Omnipaque 180 was injected into the thecal sac, with normal opacification of the nerve roots and cauda equina consistent with free flow within the subarachnoid space. FINDINGS: Contiguous axial images were obtained through the Lumbar spine after the intrathecal infusion with appropriate contrast dispersal. IMPRESSION: Successful intrathecal administration of contrast for CT myelogram. Performed by: Loyce Dys PA-C Supervised by Dr. Chryl Heck, MD Electronically Signed   By: Malachy Moan M.D.   On: 11/15/2023 14:34   CT THORACIC SPINE WO CONTRAST Result Date: 11/14/2023 CLINICAL DATA:  Mid-back pain, abnormal neuro, positive xray (Ped 0-17y); Low  back pain, cauda equina syndrome suspected EXAM: CT THORACIC AND LUMBAR SPINE WITHOUT CONTRAST TECHNIQUE: Multidetector CT imaging of the thoracic and lumbar spine was performed without contrast. Multiplanar CT image reconstructions were also generated. RADIATION DOSE REDUCTION: This exam was performed according to the departmental dose-optimization program which includes automated exposure control, adjustment of the mA and/or kV according to patient size and/or use of iterative reconstruction technique. COMPARISON:  MRI lumbar spine 12/12/2008, CT chest 10/17/2023: CT abdomen pelvis 10/28/2023 FINDINGS: CT THORACIC SPINE FINDINGS Alignment: Normal. Vertebrae: Diffusely decreased bone  density. Interval development of an age-indeterminate T1 compression fracture with at least 40% vertebral body height loss. Chronic stable T5, T7 compression fracture. Redemonstration of scattered sclerotic axial skeleton lesions. Multilevel mild degenerative changes of the spine. Paraspinal and other soft tissues: Negative. Disc levels: Maintained. CT LUMBAR SPINE FINDINGS Segmentation: 5 lumbar type vertebrae. Alignment: Normal. Vertebrae: Diffusely decreased bone density. No severe osseous neural foraminal or central canal stenosis. L3-L4, L4-L5, L5-S1 disc bulge with question at least moderate central canal stenosis at the L4-L5 level. No acute fracture or focal pathologic process. Paraspinal and other soft tissues: Negative. Disc levels: Maintained. Other:. Diffuse bronchial wall thickening. Limited evaluation of the lungs due to motion artifact. Severe right hydroureteronephrosis that is partially visualized. Atherosclerotic plaque. Cardiac leads. Status post cholecystectomy. Acute minimally displaced right posterior first rib fracture (5:21). IMPRESSION: IMPRESSION CT THORACIC SPINE IMPRESSION 1. No acute displaced fracture or traumatic listhesis of the thoracic spine. 2. Redemonstration of scattered sclerotic axial skeleton lesions. Question metastases. CT LUMBAR SPINE IMPRESSION 1. No acute displaced fracture or traumatic listhesis of the lumbar spine. 2. L3-L4, L4-L5, L5-S1 disc bulge with question at least moderate central canal stenosis at the L4-L5 level. Recommend MRI for further evaluation. Other imaging findings of potential clinical significance: 1. Acute minimally displaced right posterior first rib fracture 2. Severe right hydroureteronephrosis partially visualized. Finding better evaluated on CT abdomen pelvis 10/28/2023 3.  Aortic Atherosclerosis (ICD10-I70.0). Electronically Signed   By: Tish Frederickson M.D.   On: 11/14/2023 20:53   CT LUMBAR SPINE WO CONTRAST Result Date:  11/14/2023 CLINICAL DATA:  Mid-back pain, abnormal neuro, positive xray (Ped 0-17y); Low back pain, cauda equina syndrome suspected EXAM: CT THORACIC AND LUMBAR SPINE WITHOUT CONTRAST TECHNIQUE: Multidetector CT imaging of the thoracic and lumbar spine was performed without contrast. Multiplanar CT image reconstructions were also generated. RADIATION DOSE REDUCTION: This exam was performed according to the departmental dose-optimization program which includes automated exposure control, adjustment of the mA and/or kV according to patient size and/or use of iterative reconstruction technique. COMPARISON:  MRI lumbar spine 12/12/2008, CT chest 10/17/2023: CT abdomen pelvis 10/28/2023 FINDINGS: CT THORACIC SPINE FINDINGS Alignment: Normal. Vertebrae: Diffusely decreased bone density. Interval development of an age-indeterminate T1 compression fracture with at least 40% vertebral body height loss. Chronic stable T5, T7 compression fracture. Redemonstration of scattered sclerotic axial skeleton lesions. Multilevel mild degenerative changes of the spine. Paraspinal and other soft tissues: Negative. Disc levels: Maintained. CT LUMBAR SPINE FINDINGS Segmentation: 5 lumbar type vertebrae. Alignment: Normal. Vertebrae: Diffusely decreased bone density. No severe osseous neural foraminal or central canal stenosis. L3-L4, L4-L5, L5-S1 disc bulge with question at least moderate central canal stenosis at the L4-L5 level. No acute fracture or focal pathologic process. Paraspinal and other soft tissues: Negative. Disc levels: Maintained. Other:. Diffuse bronchial wall thickening. Limited evaluation of the lungs due to motion artifact. Severe right hydroureteronephrosis that is partially visualized. Atherosclerotic plaque. Cardiac leads. Status post cholecystectomy. Acute  minimally displaced right posterior first rib fracture (5:21). IMPRESSION: IMPRESSION CT THORACIC SPINE IMPRESSION 1. No acute displaced fracture or traumatic  listhesis of the thoracic spine. 2. Redemonstration of scattered sclerotic axial skeleton lesions. Question metastases. CT LUMBAR SPINE IMPRESSION 1. No acute displaced fracture or traumatic listhesis of the lumbar spine. 2. L3-L4, L4-L5, L5-S1 disc bulge with question at least moderate central canal stenosis at the L4-L5 level. Recommend MRI for further evaluation. Other imaging findings of potential clinical significance: 1. Acute minimally displaced right posterior first rib fracture 2. Severe right hydroureteronephrosis partially visualized. Finding better evaluated on CT abdomen pelvis 10/28/2023 3.  Aortic Atherosclerosis (ICD10-I70.0). Electronically Signed   By: Tish Frederickson M.D.   On: 11/14/2023 20:53   DG Chest 2 View Result Date: 11/06/2023 CLINICAL DATA:  Chest pain.  COPD. EXAM: CHEST - 2 VIEW COMPARISON:  October 10, 2023. FINDINGS: Stable cardiomediastinal silhouette. Left-sided defibrillator is unchanged. Both lungs are clear. The visualized skeletal structures are unremarkable. IMPRESSION: No active cardiopulmonary disease. Electronically Signed   By: Lupita Raider M.D.   On: 11/06/2023 17:09   CUP PACEART INCLINIC DEVICE CHECK Result Date: 10/20/2023 Normal in-clinic ICD check. Thresholds, sensing, and impedance WNL or stable for patient over time. Brief AT/NSVT episodes. Estimated longevity 6.9 years.  Referred to [facility] clinic for fluid management.  Pt enrolled in remote follow-up.Ancil Boozer, BSN, RN  Total time 75 minutes.

## 2023-11-18 NOTE — Progress Notes (Signed)
 Patient ID: [name], female   DOB: 10-26-1955, 68 y.o.   MRN: [id number]    Referring Physician(s): Winter,C  Supervising Physician: Roanna Banning  Patient Status:  [facility] - In-pt  Chief Complaint:  Metastatic bladder cancer. Hydronephrosis, associated low back pain radiating to hips, difficulty ambulating.  Subjective:  Patient day 2 post op from PCN, without new complaint today. Still having some low back pain but reportedly much improved from yesterday and days prior. Continues to have good drain output. Nursing reports no other concerns.  68 year old female with history of bladder cancer, found initially on CT 10/28/23, who was seen at [facility] ED 11/14/23 with worsening low back pain and urinary retention. CT at that visit showed severe R hydronephrosis and right renal atrophy with solid bladder mass, with concern of spread up right ureter, concerning for urothelial carcinoma. Pt referred to IR for PCN placement.  Allergies: Patient has no known allergies.  Medications: Prior to Admission medications   Medication Sig Start Date End Date Taking? Authorizing Provider  ARIPiprazole (ABILIFY) 5 MG tablet TAKE 1 TABLET BY MOUTH DAILY 10/07/23  Yes Cox, Kirsten, MD  aspirin 81 MG tablet Take 81 mg by mouth daily.   Yes [provider]  BREO ELLIPTA 100-25 MCG/ACT AEPB INHALE 1 PUFF INTO THE LUNGS DAILY. Patient taking differently: Inhale 1 puff into the lungs daily as needed. 06/03/23  Yes Craft, Huston Foley, PA  Buprenorphine HCl (BELBUCA) 900 MCG FILM Place 900 mcg inside cheek every 12 (twelve) hours.   Yes [provider]  carvedilol (COREG) 3.125 MG tablet TAKE ONE (1) TABLET BY MOUTH TWICE DAILY WITH MEALS Patient taking differently: Take 3.125 mg by mouth 2 (two) times daily with a meal. 06/02/23  Yes Cox, Kirsten, MD  cefdinir (OMNICEF) 300 MG capsule Take 300 mg by mouth 2 (two) times daily. 11/10/23  Yes [provider]  denosumab (PROLIA) 60 MG/ML SOSY  injection Inject 60 mg into the skin every 6 (six) months. 02/14/20  Yes Abigail Miyamoto, MD  diclofenac Sodium (VOLTAREN) 1 % GEL Apply 2 g topically 4 (four) times daily. Patient taking differently: Apply 2 g topically as needed (Pain). 05/01/21  Yes Abigail Miyamoto, MD  EPINEPHRINE 0.3 mg/0.3 mL IJ SOAJ injection Inject 0.3 mLs (0.3 mg total) into the muscle as needed for anaphylaxis. 04/27/20  Yes Abigail Miyamoto, MD  esomeprazole (NEXIUM) 20 MG capsule TAKE ONE (1) CAPSULE BY MOUTH ONCE DAILY 08/28/23  Yes Cox, Kirsten, MD  FARXIGA 5 MG TABS tablet Take 5 mg by mouth daily. 10/10/23  Yes [provider]  fluticasone (FLONASE) 50 MCG/ACT nasal spray USE 2 SPRAYS IN EACH NOSTRILS DAILY AS NEEDED Patient taking differently: Place 2 sprays into both nostrils daily as needed for allergies or rhinitis. 07/18/23  Yes [name], FNP  furosemide (LASIX) 40 MG tablet Take 1 tablet (40 mg total) by mouth daily. 06/25/23 11/14/23 Yes Georgeanna Lea, MD  gabapentin (NEURONTIN) 800 MG tablet Take 1 tablet (800 mg total) by mouth 3 (three) times daily. Patient usually takes twice a day 01/21/20  Yes Abigail Miyamoto, MD  Galcanezumab-gnlm [facility]) 120 MG/ML SOSY Inject 120 mg into the skin every 30 (thirty) days. 01/06/23  Yes Cox, Kirsten, MD  ipratropium-albuterol (DUONEB) 0.5-2.5 (3) MG/3ML SOLN USE 1 VIAL VIA NEBULIZER EVERY 6 HOURS AS NEEDED FOR SHORTNESS OF BREATH 10/16/23  Yes [name], FNP  levocetirizine (XYZAL) 5 MG tablet Take 5 mg by mouth  daily as needed for allergies. 12/13/19  Yes [provider]  lubiprostone (AMITIZA) 24 MCG capsule TAKE 1 CAPSULE(24 MCG) BY MOUTH TWICE DAILY WITH A MEAL 09/15/23  Yes Cox, Kirsten, MD  megestrol (MEGACE) 20 MG tablet Take 1 tablet (20 mg total) by mouth daily. 06/19/23  Yes Cox, Kirsten, MD  Multiple Vitamin (MULTIVITAMIN WITH MINERALS) TABS tablet Take 1 tablet by mouth daily. 10/30/20  Yes Rodolph Bong, MD   naloxone [facility]) nasal spray 4 mg/0.1 mL Place 1 spray into the nose once. 01/12/21  Yes [provider]  nitrofurantoin, macrocrystal-monohydrate, (MACROBID) 100 MG capsule Take 100 mg by mouth 2 (two) times daily. 11/10/23  Yes [provider]  nitroGLYCERIN (NITROSTAT) 0.4 MG SL tablet Place 1 tablet (0.4 mg total) under the tongue every 5 (five) minutes as needed for chest pain. 11/27/21  Yes Georgeanna Lea, MD  oxyCODONE (ROXICODONE) 15 MG immediate release tablet Take 15 mg by mouth every 8 (eight) hours as needed for pain. 08/29/21  Yes [provider]  potassium chloride (KLOR-CON) 10 MEQ tablet Take 3 tablets (30 mEq total) by mouth daily for 7 days. 10/29/23 11/14/23 Yes Camnitz, Will Daphine Deutscher, MD  ranolazine (RANEXA) 500 MG 12 hr tablet TAKE 1 TABLET BY MOUTH TWICE DAILY 10/28/23  Yes Georgeanna Lea, MD  rosuvastatin (CRESTOR) 20 MG tablet TAKE 1 TABLET BY MOUTH ONCE DAILY 08/28/23  Yes Cox, Kirsten, MD  sacubitril-valsartan (ENTRESTO) 24-26 MG TAKE ONE (1) TABLET BY MOUTH TWICE DAILY Patient taking differently: Take 1 tablet by mouth 2 (two) times daily. 06/02/23  Yes Cox, Kirsten, MD  sertraline (ZOLOFT) 100 MG tablet TAKE 1/2 TABLET(50 MG) BY MOUTH DAILY 08/24/23  Yes Cox, Kirsten, MD  tiZANidine (ZANAFLEX) 4 MG tablet Take 4 mg by mouth at bedtime. 11/09/20  Yes [provider]  topiramate (TOPAMAX) 50 MG tablet TAKE TWO (2) TABLETS BY MOUTH EVERY DAY AT BEDTIME Patient taking differently: Take 100 mg by mouth at bedtime. TAKE TWO (2) TABLETS BY MOUTH EVERY DAY AT BEDTIME 06/02/23  Yes Cox, Kirsten, MD  Ubrogepant (UBRELVY) 100 MG TABS Take 1 tablet (100 mg total) by mouth as needed (May repeat after 2 hours.  Maximum 2 tablets in 24 hours). TAKE 1 TABLET BY MOUTH BY MOUTH AS NEEDED( MAY REPEAT 1 TABLET AFTER 2 HOURS IF NEEDED, MAXIMUM 2 TABLETS IN 24 HOURS) [name]: 100 mg 04/07/23  Yes Cox, Kirsten, MD  Vitamin D, Ergocalciferol, (DRISDOL) 1.25 MG (50000  UNIT) CAPS capsule TAKE 1 CAPSULE BY MOUTH EVERY 7 DAYS FOR 12 DOSES Patient taking differently: Take 50,000 Units by mouth every 7 (seven) days. Patient usually takes this medication on mondays 06/30/23  Yes Cox, Kirsten, MD  dextromethorphan 15 MG/5ML syrup Take 10 mLs (30 mg total) by mouth 4 (four) times daily as needed for cough. Patient not taking: Reported on 11/14/2023 10/22/23   [name], FNP  guaiFENesin-codeine 100-10 MG/5ML syrup Take 5 mLs by mouth 3 (three) times daily as needed for cough. 10/16/23   [name], FNP  ondansetron (ZOFRAN) 4 MG tablet Take 1 tablet (4 mg total) by mouth every 8 (eight) hours as needed for nausea or vomiting. 05/17/20   Abigail Miyamoto, MD     Vital Signs: BP 122/70 (BP Location: Right Arm)   Pulse 76   Temp 97.7 F (36.5 C) (Oral)   Resp 18   Ht 4\' 10"  (1.473 m)   Wt 161 lb 6 oz (73.2 kg)  SpO2 100%   BMI 33.73 kg/m   Physical Exam Constitutional:      Comments: Resting comfortably in bed  Cardiovascular:     Rate and Rhythm: Normal rate.  Pulmonary:     Effort: Pulmonary effort is normal.     Breath sounds: Normal breath sounds.  Abdominal:     Palpations: Abdomen is soft.     Tenderness: There is no abdominal tenderness.  Genitourinary:    Comments: PCN surgical site of the right flank appears well. No surrounding erythema, warmth, drainage, swelling. Drain and bag with clear pink-tinged fluid. No debris, clots or other sediment appreciated. total output today. Skin:    General: Skin is warm and dry.  Neurological:     General: No focal deficit present.     Mental Status: She is alert and oriented to person, place, and time.     Imaging: CT PELVIS WO CONTRAST Result Date: 11/17/2023 CLINICAL DATA:  Bladder cancer to look for metastasis. EXAM: CT PELVIS WITHOUT CONTRAST TECHNIQUE: Multidetector CT imaging of the pelvis was performed following the standard protocol without intravenous contrast. RADIATION DOSE  REDUCTION: This exam was performed according to the departmental dose-optimization program which includes automated exposure control, adjustment of the mA and/or kV according to patient size and/or use of iterative reconstruction technique. COMPARISON:  CT abdomen and pelvis 10/25/2020 FINDINGS: Urinary Tract: Foley catheter decompresses the bladder. Distal left ureter appears dilated, possibly reflux or obstruction. No stones are demonstrated. Bowel: Visualized small and large bowel are not abnormally distended. Prominent diverticulosis of the sigmoid colon. No definitive inflammatory changes to suggest acute diverticulitis. Vascular/Lymphatic: Mild calcification in the iliac arteries. No significant lymphadenopathy. Reproductive:  Uterus and ovaries are not enlarged. Other: Nonspecific soft tissue thickening in the presacral perirectal fat. This could represent a hematoma in the setting of sacral fractures. See below. Alternatively, this could represent postradiation or postsurgical change if there has been surgery or radiation to this area. Alternatively, infectious, inflammatory, or neoplastic etiologies could be considered. No discrete mass or fluid collection. Musculoskeletal: Postoperative right hip arthroplasty. Degenerative changes in the lower lumbar spine. Nondisplaced fracture of the sacrum at the level of S2. This may represent a stress fracture or insufficiency fracture. Old appearing fracture deformity of the left inferior pubic ramus. IMPRESSION: 1. Foley catheter decompresses the bladder, limiting evaluation. 2. No lymphadenopathy or soft tissue mass identified. 3. Colonic diverticulosis without evidence of acute diverticulitis. 4. Nonspecific soft tissue thickening in the presacral space. Possibly etiologies include infectious, inflammatory, postoperative/postradiation, or posttraumatic change. 5. Nondisplaced fractures of the sacrum at the level of S2, possibly stress or insufficiency fracture. No  destructive bone lesions are identified. Electronically Signed   By: Burman Nieves M.D.   On: 11/17/2023 20:23   IR NEPHROSTOMY PLACEMENT RIGHT Result Date: 11/16/2023 INDICATION: 68 year old female with vaginal/bladder mass and right distal ureteral obstruction. Cystoscopy in attempted ureteral stent placement was unsuccessful. Therefore, patient requires placement of a percutaneous nephrostomy tube. EXAM: IR NEPHROSTOMY PLACEMENT RIGHT COMPARISON:  None Available. MEDICATIONS: No additional antibiotics administered. ANESTHESIA/SEDATION: Fentanyl 2 mcg IV; Versed 100 mg IV administered by the radiology nurse Moderate Sedation Time:  10 minutes The patient's vital signs and level of consciousness were continuously monitored during the procedure by the interventional radiology nurse under my direct supervision. CONTRAST:  15mL OMNIPAQUE IOHEXOL 300 MG/ML SOLN - administered into the collecting system(s) FLUOROSCOPY: Radiation exposure index: 2 mGy reference air kerma COMPLICATIONS: None immediate. TECHNIQUE: The procedure, risks, benefits, and alternatives  were explained to the patient. Questions regarding the procedure were encouraged and answered. The patient understands and consents to the procedure. The right flank was prepped with chlorhexidine in a sterile fashion, and a sterile drape was applied covering the operative field. A sterile gown and sterile gloves were used for the procedure. Local anesthesia was provided with 1% Lidocaine. The right flank was interrogated with ultrasound and the left kidney identified. The kidney is hydronephrotic. A suitable access site on the skin overlying the lower pole, posterior calix was identified. After local mg anesthesia was achieved, a small skin nick was made with an 11 blade scalpel. A 21 gauge Accustick needle was then advanced under direct sonographic guidance into the lower pole of the right kidney. A 0.018 inch wire was advanced under fluoroscopic guidance  into the left renal collecting system. The Accustick sheath was then advanced over the wire and a 0.018 system exchanged for a 0.035 system. Gentle hand injection of contrast material confirms placement of the sheath within the renal collecting system. There is severe hydronephrosis. The tract from the scan into the renal collecting system was then dilated serially to 10-French. A 10-French Cook all-purpose drain was then placed and positioned under fluoroscopic guidance. The locking loop is well formed within the left renal pelvis. The catheter was secured to the skin with 2-0 Prolene and a sterile bandage was placed. Catheter was left to gravity bag drainage. IMPRESSION: Successful placement of a right 10 French percutaneous nephrostomy tube. Electronically Signed   By: Malachy Moan M.D.   On: 11/16/2023 15:43   DG C-Arm 1-60 Min-No Report Result Date: 11/16/2023 Fluoroscopy was utilized by the requesting physician.  No radiographic interpretation.   DG C-Arm 1-60 Min-No Report Result Date: 11/16/2023 Fluoroscopy was utilized by the requesting physician.  No radiographic interpretation.   CT THORACIC SPINE WO CONTRAST Result Date: 11/15/2023 CLINICAL DATA:  Back pain. Lower extremity weakness. Question cauda acquired syndrome. EXAM: CT MYELOGRAPHY LUMBAR SPINE TECHNIQUE: CT imaging of the thoracic and lumbar spine was performed after Isovue 300M contrast administration. Multiplanar CT image reconstructions were also generated. RADIATION DOSE REDUCTION: This exam was performed according to the departmental dose-optimization program which includes automated exposure control, adjustment of the mA and/or kV according to patient size and/or use of iterative reconstruction technique. COMPARISON:  CT of the thoracic and lumbar spine 11/14/2023. FINDINGS: Thoracic spine Alignment: No significant listhesis is present. Mild rightward curvature is centered at T6. Mild straightening of the normal thoracic  kyphosis is stable. Vertebrae: The superior endplate compression fracture at L1 is more severe on the left. Slight retropulsed bone is present without focal stenosis. The superior endplate fracture at T5 demonstrates 30-40% loss of height. No retropulsed bone is present. This may be subacute. Superior endplate fracture at T7 is more acute, with 40-50% loss of height. Slight retropulsed bone is present without significant stenosis. Progressive collapse is present at this level. Sclerotic lesions are present on the left at T2 and T3 extending into the left pedicle at T3. Conus medullaris: Extends to the  level and appears normal. Paraspinal and other soft tissues: The paraspinous soft tissues are within normal limits. The visualized lung fields are clear. Pacing wires are in place. Severe right-sided hydronephrosis is again noted. Disc levels: No significant central canal stenosis or cord compression is present. Mild right foraminal narrowing present at T8-9, T9-10 and T10-11. Mild left foraminal narrowing present at T5-6 and T6-7. Lumbar spine Segmentation: 5 non rib-bearing lumbar type vertebral  bodies are present. The lowest fully formed vertebral body is L5. Alignment: No significant listhesis is present. Levoconvex curvature is centered at L1-2. Mild straightening of the normal lumbar lordosis is present. Vertebrae: Vertebral body heights are normal. No acute or healing fractures are present. Conus medullaris: Extends to the T12-L1   Level and appears normal. Paraspinal and other soft tissues: Atherosclerotic calcifications are present the aorta and branch vessels. No adenopathy is present. Severe right-sided hydronephrosis is again noted. Disc levels: L1-2: Normal disc signal and height is present. No focal protrusion or stenosis is present. L2-3: Normal disc signal and height is present. No focal protrusion or stenosis is present. L3-4: A mild broad-based disc bulge is present. Mild facet hypertrophy is noted  bilaterally. Disc material extends into the foramina bilaterally. Mild right foraminal narrowing is present. L4-5: A leftward disc protrusion is present. Moderate left subarticular and foraminal narrowing is present. Mild right foraminal narrowing is present. L5-S1: Chronic loss of disc height is present. A leftward disc protrusion likely contacts the traversing left S1 nerve roots. Facet spurring contributes to mild foraminal narrowing bilaterally, left greater than right. IMPRESSION: 1. No significant cord compression or central canal stenosis to explain the patient's symptoms. 2. Acute superior endplate compression fracture at T7 with 40-50% loss of height. Slight retropulsed bone is present without significant stenosis. 3. Superior endplate compression fracture at T5 with 30-40% loss of height. No retropulsed bone is present. This may be subacute. 4. Superior endplate compression fracture at L1 is more severe on the left. Slight retropulsed bone is present without focal stenosis. 5. Sclerotic lesions on the left at T2 and T3 extending into the left pedicle at T3. These are concerning for metastatic disease. 6. Mild right foraminal narrowing at T8-9, T9-10 and T10-11. 7. Mild left foraminal narrowing at T5-6 and T6-7. 8. Moderate left subarticular and foraminal narrowing at L4-5. 9. Mild foraminal narrowing bilaterally at L5-S1, left greater than right. 10. Severe right-sided hydronephrosis is again noted. 11.  Aortic Atherosclerosis (ICD10-I70.0). These results were called by telephone at the time of interpretation on 11/15/2023 at 1:50 pm to Dr. Coletta Memos, Who verbally acknowledged these results. Electronically Signed   By: Marin Roberts M.D.   On: 11/15/2023 15:31   CT LUMBAR SPINE WO CONTRAST Result Date: 11/15/2023 CLINICAL DATA:  Back pain. Lower extremity weakness. Question cauda acquired syndrome. EXAM: CT MYELOGRAPHY LUMBAR SPINE TECHNIQUE: CT imaging of the thoracic and lumbar spine was  performed after Isovue 300M contrast administration. Multiplanar CT image reconstructions were also generated. RADIATION DOSE REDUCTION: This exam was performed according to the departmental dose-optimization program which includes automated exposure control, adjustment of the mA and/or kV according to patient size and/or use of iterative reconstruction technique. COMPARISON:  CT of the thoracic and lumbar spine 11/14/2023. FINDINGS: Thoracic spine Alignment: No significant listhesis is present. Mild rightward curvature is centered at T6. Mild straightening of the normal thoracic kyphosis is stable. Vertebrae: The superior endplate compression fracture at L1 is more severe on the left. Slight retropulsed bone is present without focal stenosis. The superior endplate fracture at T5 demonstrates 30-40% loss of height. No retropulsed bone is present. This may be subacute. Superior endplate fracture at T7 is more acute, with 40-50% loss of height. Slight retropulsed bone is present without significant stenosis. Progressive collapse is present at this level. Sclerotic lesions are present on the left at T2 and T3 extending into the left pedicle at T3. Conus medullaris: Extends to the  level  and appears normal. Paraspinal and other soft tissues: The paraspinous soft tissues are within normal limits. The visualized lung fields are clear. Pacing wires are in place. Severe right-sided hydronephrosis is again noted. Disc levels: No significant central canal stenosis or cord compression is present. Mild right foraminal narrowing present at T8-9, T9-10 and T10-11. Mild left foraminal narrowing present at T5-6 and T6-7. Lumbar spine Segmentation: 5 non rib-bearing lumbar type vertebral bodies are present. The lowest fully formed vertebral body is L5. Alignment: No significant listhesis is present. Levoconvex curvature is centered at L1-2. Mild straightening of the normal lumbar lordosis is present. Vertebrae: Vertebral body  heights are normal. No acute or healing fractures are present. Conus medullaris: Extends to the T12-L1   Level and appears normal. Paraspinal and other soft tissues: Atherosclerotic calcifications are present the aorta and branch vessels. No adenopathy is present. Severe right-sided hydronephrosis is again noted. Disc levels: L1-2: Normal disc signal and height is present. No focal protrusion or stenosis is present. L2-3: Normal disc signal and height is present. No focal protrusion or stenosis is present. L3-4: A mild broad-based disc bulge is present. Mild facet hypertrophy is noted bilaterally. Disc material extends into the foramina bilaterally. Mild right foraminal narrowing is present. L4-5: A leftward disc protrusion is present. Moderate left subarticular and foraminal narrowing is present. Mild right foraminal narrowing is present. L5-S1: Chronic loss of disc height is present. A leftward disc protrusion likely contacts the traversing left S1 nerve roots. Facet spurring contributes to mild foraminal narrowing bilaterally, left greater than right. IMPRESSION: 1. No significant cord compression or central canal stenosis to explain the patient's symptoms. 2. Acute superior endplate compression fracture at T7 with 40-50% loss of height. Slight retropulsed bone is present without significant stenosis. 3. Superior endplate compression fracture at T5 with 30-40% loss of height. No retropulsed bone is present. This may be subacute. 4. Superior endplate compression fracture at L1 is more severe on the left. Slight retropulsed bone is present without focal stenosis. 5. Sclerotic lesions on the left at T2 and T3 extending into the left pedicle at T3. These are concerning for metastatic disease. 6. Mild right foraminal narrowing at T8-9, T9-10 and T10-11. 7. Mild left foraminal narrowing at T5-6 and T6-7. 8. Moderate left subarticular and foraminal narrowing at L4-5. 9. Mild foraminal narrowing bilaterally at L5-S1,  left greater than right. 10. Severe right-sided hydronephrosis is again noted. 11.  Aortic Atherosclerosis (ICD10-I70.0). These results were called by telephone at the time of interpretation on 11/15/2023 at 1:50 pm to Dr. Coletta Memos, Who verbally acknowledged these results. Electronically Signed   By: Marin Roberts M.D.   On: 11/15/2023 15:31   DG MYELOGRAPHY LUMBAR INJ MULTI REGION Result Date: 11/15/2023 CLINICAL DATA:  68 year old female with history of back pain, lower extremity weakness. Contrast injection for CT myelogram requested. EXAM: LUMBAR MYELOGRAM FLUOROSCOPY: dictate in minutes and seconds PROCEDURE: After thorough discussion of risks and benefits of the procedure including bleeding, infection, injury to nerves, blood vessels, adjacent structures as well as headache and CSF leak, written and oral informed consent was obtained. Time out form was completed. Patient was positioned prone on the fluoroscopy table. Local anesthesia was provided with 1% lidocaine without epinephrine after prepped and draped in the usual sterile fashion. Puncture was performed at L2-L3 using a 3 1/2 inch 20-gauge spinal needle via left approach. Using a single pass through the dura, the needle was placed within the thecal sac, with return of  clear CSF. 15 mL of Omnipaque 180 was injected into the thecal sac, with normal opacification of the nerve roots and cauda equina consistent with free flow within the subarachnoid space. FINDINGS: Contiguous axial images were obtained through the Lumbar spine after the intrathecal infusion with appropriate contrast dispersal. IMPRESSION: Successful intrathecal administration of contrast for CT myelogram. Performed by: Loyce Dys PA-C Supervised by Dr. Chryl Heck, MD Electronically Signed   By: Malachy Moan M.D.   On: 11/15/2023 14:34   CT THORACIC SPINE WO CONTRAST Result Date: 11/14/2023 CLINICAL DATA:  Mid-back pain, abnormal neuro, positive xray (Ped  0-17y); Low back pain, cauda equina syndrome suspected EXAM: CT THORACIC AND LUMBAR SPINE WITHOUT CONTRAST TECHNIQUE: Multidetector CT imaging of the thoracic and lumbar spine was performed without contrast. Multiplanar CT image reconstructions were also generated. RADIATION DOSE REDUCTION: This exam was performed according to the departmental dose-optimization program which includes automated exposure control, adjustment of the mA and/or kV according to patient size and/or use of iterative reconstruction technique. COMPARISON:  MRI lumbar spine 12/12/2008, CT chest 10/17/2023: CT abdomen pelvis 10/28/2023 FINDINGS: CT THORACIC SPINE FINDINGS Alignment: Normal. Vertebrae: Diffusely decreased bone density. Interval development of an age-indeterminate T1 compression fracture with at least 40% vertebral body height loss. Chronic stable T5, T7 compression fracture. Redemonstration of scattered sclerotic axial skeleton lesions. Multilevel mild degenerative changes of the spine. Paraspinal and other soft tissues: Negative. Disc levels: Maintained. CT LUMBAR SPINE FINDINGS Segmentation: 5 lumbar type vertebrae. Alignment: Normal. Vertebrae: Diffusely decreased bone density. No severe osseous neural foraminal or central canal stenosis. L3-L4, L4-L5, L5-S1 disc bulge with question at least moderate central canal stenosis at the L4-L5 level. No acute fracture or focal pathologic process. Paraspinal and other soft tissues: Negative. Disc levels: Maintained. Other:. Diffuse bronchial wall thickening. Limited evaluation of the lungs due to motion artifact. Severe right hydroureteronephrosis that is partially visualized. Atherosclerotic plaque. Cardiac leads. Status post cholecystectomy. Acute minimally displaced right posterior first rib fracture (5:21). IMPRESSION: IMPRESSION CT THORACIC SPINE IMPRESSION 1. No acute displaced fracture or traumatic listhesis of the thoracic spine. 2. Redemonstration of scattered sclerotic axial  skeleton lesions. Question metastases. CT LUMBAR SPINE IMPRESSION 1. No acute displaced fracture or traumatic listhesis of the lumbar spine. 2. L3-L4, L4-L5, L5-S1 disc bulge with question at least moderate central canal stenosis at the L4-L5 level. Recommend MRI for further evaluation. Other imaging findings of potential clinical significance: 1. Acute minimally displaced right posterior first rib fracture 2. Severe right hydroureteronephrosis partially visualized. Finding better evaluated on CT abdomen pelvis 10/28/2023 3.  Aortic Atherosclerosis (ICD10-I70.0). Electronically Signed   By: Tish Frederickson M.D.   On: 11/14/2023 20:53   CT LUMBAR SPINE WO CONTRAST Result Date: 11/14/2023 CLINICAL DATA:  Mid-back pain, abnormal neuro, positive xray (Ped 0-17y); Low back pain, cauda equina syndrome suspected EXAM: CT THORACIC AND LUMBAR SPINE WITHOUT CONTRAST TECHNIQUE: Multidetector CT imaging of the thoracic and lumbar spine was performed without contrast. Multiplanar CT image reconstructions were also generated. RADIATION DOSE REDUCTION: This exam was performed according to the departmental dose-optimization program which includes automated exposure control, adjustment of the mA and/or kV according to patient size and/or use of iterative reconstruction technique. COMPARISON:  MRI lumbar spine 12/12/2008, CT chest 10/17/2023: CT abdomen pelvis 10/28/2023 FINDINGS: CT THORACIC SPINE FINDINGS Alignment: Normal. Vertebrae: Diffusely decreased bone density. Interval development of an age-indeterminate T1 compression fracture with at least 40% vertebral body height loss. Chronic stable T5, T7 compression fracture. Redemonstration of scattered sclerotic axial  skeleton lesions. Multilevel mild degenerative changes of the spine. Paraspinal and other soft tissues: Negative. Disc levels: Maintained. CT LUMBAR SPINE FINDINGS Segmentation: 5 lumbar type vertebrae. Alignment: Normal. Vertebrae: Diffusely decreased bone  density. No severe osseous neural foraminal or central canal stenosis. L3-L4, L4-L5, L5-S1 disc bulge with question at least moderate central canal stenosis at the L4-L5 level. No acute fracture or focal pathologic process. Paraspinal and other soft tissues: Negative. Disc levels: Maintained. Other:. Diffuse bronchial wall thickening. Limited evaluation of the lungs due to motion artifact. Severe right hydroureteronephrosis that is partially visualized. Atherosclerotic plaque. Cardiac leads. Status post cholecystectomy. Acute minimally displaced right posterior first rib fracture (5:21). IMPRESSION: IMPRESSION CT THORACIC SPINE IMPRESSION 1. No acute displaced fracture or traumatic listhesis of the thoracic spine. 2. Redemonstration of scattered sclerotic axial skeleton lesions. Question metastases. CT LUMBAR SPINE IMPRESSION 1. No acute displaced fracture or traumatic listhesis of the lumbar spine. 2. L3-L4, L4-L5, L5-S1 disc bulge with question at least moderate central canal stenosis at the L4-L5 level. Recommend MRI for further evaluation. Other imaging findings of potential clinical significance: 1. Acute minimally displaced right posterior first rib fracture 2. Severe right hydroureteronephrosis partially visualized. Finding better evaluated on CT abdomen pelvis 10/28/2023 3.  Aortic Atherosclerosis (ICD10-I70.0). Electronically Signed   By: Tish Frederickson M.D.   On: 11/14/2023 20:53    Labs:  CBC: Recent Labs    09/25/23 1449 11/14/23 1649 11/15/23 0320 11/17/23 0350  WBC 9.6 12.4* 11.8* 14.7*  HGB 12.7 13.2 11.5* 10.5*  HCT 37.5 41.9 35.7* 33.5*  PLT 314 361 331 320    COAGS: Recent Labs    11/15/23 0320  INR 1.1  APTT 26    BMP: Recent Labs    11/15/23 0320 11/16/23 0624 11/17/23 0350 11/18/23 1331  NA 136 141 140 140  K 4.1 4.5 4.5 4.4  CL 106 111 112* 112*  CO2 21* 17* 19* 20*  GLUCOSE 125* 89 96 90  BUN 79* 61* 54* 45*  CALCIUM 8.6* 8.4* 8.4* 8.5*  CREATININE  2.49* 1.75* 1.80* 1.83*  GFRNONAA 20* 31* 30* 30*    LIVER FUNCTION TESTS: Recent Labs    06/02/23 1701 06/23/23 1516 09/25/23 1449 11/14/23 1649  BILITOT 0.2 0.3 0.3 0.5  AST 12 13 12 24   ALT 4 5 7 15   ALKPHOS 67 63 78 72  PROT 6.7 6.6 6.5 7.6  ALBUMIN 4.4 4.2 4.3 3.6    Assessment and Plan:  Pt is day 2 post op from right PCN for hydronephrosis with associated low back pain secondary to metastatic bladder cancer. Pt reporting pain much improved today compared to yesterday and days prior.  Drain continues to have good output, total today. Clear pink tinged fluid with no clots, tissue or other sediment appreciated today. External drain site appears well with no indication of leak or other complication, such as infection. Nursing at bedside reported flushing drain with 5cc this morning with no complication.   Plan for continued monitoring of drain output with further clinical care managed by urology, oncology and other associated teams.   Electronically Signed: Katheren Puller, PA-C/Kevin Allred,PA-C 11/18/2023, 4:32 PM   I spent a total of 15 Minutes at the the patient's bedside AND on the patient's hospital floor or unit, greater than 50% of which was counseling/coordinating care for post op PCN management.

## 2023-11-18 NOTE — Progress Notes (Signed)
 No ICM remote transmission received for 11/17/2023 due to currently hospitalized and next ICM transmission scheduled for 12/08/2023.

## 2023-11-18 NOTE — Assessment & Plan Note (Addendum)
 Newly diagnosed. Staging not entirely clear with a few sclerotic lesion. Will obtain bone scan.  If T2/3 or other sclerotic lesion are accessible, recommend biopsy for diagnosis.

## 2023-11-19 ENCOUNTER — Inpatient Hospital Stay (HOSPITAL_COMMUNITY)

## 2023-11-19 DIAGNOSIS — G8220 Paraplegia, unspecified: Secondary | ICD-10-CM | POA: Diagnosis not present

## 2023-11-19 DIAGNOSIS — N179 Acute kidney failure, unspecified: Secondary | ICD-10-CM | POA: Diagnosis not present

## 2023-11-19 DIAGNOSIS — M899 Disorder of bone, unspecified: Secondary | ICD-10-CM | POA: Diagnosis not present

## 2023-11-19 DIAGNOSIS — N898 Other specified noninflammatory disorders of vagina: Secondary | ICD-10-CM | POA: Diagnosis not present

## 2023-11-19 DIAGNOSIS — N3289 Other specified disorders of bladder: Secondary | ICD-10-CM | POA: Diagnosis not present

## 2023-11-19 DIAGNOSIS — R339 Retention of urine, unspecified: Secondary | ICD-10-CM | POA: Diagnosis not present

## 2023-11-19 DIAGNOSIS — C679 Malignant neoplasm of bladder, unspecified: Secondary | ICD-10-CM | POA: Diagnosis not present

## 2023-11-19 LAB — BASIC METABOLIC PANEL
Anion gap: 7 (ref 5–15)
BUN: 38 mg/dL — ABNORMAL HIGH (ref 8–23)
CO2: 19 mmol/L — ABNORMAL LOW (ref 22–32)
Calcium: 8.4 mg/dL — ABNORMAL LOW (ref 8.9–10.3)
Chloride: 114 mmol/L — ABNORMAL HIGH (ref 98–111)
Creatinine, Ser: 1.57 mg/dL — ABNORMAL HIGH (ref 0.44–1.00)
GFR, Estimated: 35 mL/min — ABNORMAL LOW (ref >60)
Glucose, Bld: 124 mg/dL — ABNORMAL HIGH (ref 70–99)
Potassium: 4.6 mmol/L (ref 3.5–5.1)
Sodium: 140 mmol/L (ref 135–145)

## 2023-11-19 LAB — CBC WITH DIFFERENTIAL/PLATELET
Abs Immature Granulocytes: 0.32 10*3/uL — ABNORMAL HIGH (ref 0.00–0.07)
Basophils Absolute: 0.1 10*3/uL (ref 0.0–0.1)
Basophils Relative: 1 %
Eosinophils Absolute: 0 10*3/uL (ref 0.0–0.5)
Eosinophils Relative: 0 %
HCT: 33.7 % — ABNORMAL LOW (ref 36.0–46.0)
Hemoglobin: 10.2 g/dL — ABNORMAL LOW (ref 12.0–15.0)
Immature Granulocytes: 3 %
Lymphocytes Relative: 13 %
Lymphs Abs: 1.4 10*3/uL (ref 0.7–4.0)
MCH: 29.7 pg (ref 26.0–34.0)
MCHC: 30.3 g/dL (ref 30.0–36.0)
MCV: 98 fL (ref 80.0–100.0)
Monocytes Absolute: 0.9 10*3/uL (ref 0.1–1.0)
Monocytes Relative: 9 %
Neutro Abs: 8 10*3/uL — ABNORMAL HIGH (ref 1.7–7.7)
Neutrophils Relative %: 74 %
Platelets: 287 10*3/uL (ref 150–400)
RBC: 3.44 MIL/uL — ABNORMAL LOW (ref 3.87–5.11)
RDW: 14.5 % (ref 11.5–15.5)
WBC: 10.7 10*3/uL — ABNORMAL HIGH (ref 4.0–10.5)
nRBC: 0 % (ref 0.0–0.2)

## 2023-11-19 MED ORDER — FUROSEMIDE 40 MG PO TABS
40.0000 mg | ORAL_TABLET | Freq: Every day | ORAL | Status: DC
  Administered 2023-11-19 – 2023-11-24 (×6): 40 mg via ORAL
  Filled 2023-11-19 (×6): qty 1

## 2023-11-19 MED ORDER — TECHNETIUM TC 99M MEDRONATE IV KIT
19.6000 | PACK | Freq: Once | INTRAVENOUS | Status: AC
  Administered 2023-11-19: 19.6 via INTRAVENOUS

## 2023-11-19 NOTE — Progress Notes (Signed)
 PT Cancellation Note  Patient Details Name: [name] MRN: [id number] DOB: November 07, 1955   Cancelled Treatment:     Pt off floor for bone scan per nursing. Will re-attempt next available date/time per POC.   Jannet Askew 11/19/2023, 2:07 PM

## 2023-11-19 NOTE — Progress Notes (Signed)
 [facility]                                                                               [name], is a 68 y.o. female, DOB - 1955-11-24, RUE:[id number] Admit date - 11/14/2023    Outpatient Primary MD for the patient is [name], FNP  LOS - 5  days    Brief summary    68 year old female with history of COPD, bladder cancer, spinal stenosis who presented to the emergency department with complaint of back pain radiating to her hips, inability to ambulate, weakness of bilateral lower extremity, lack of bowel movement.  Hemodynamically stable on presentation.  MRI could not be done because of presence of pacemaker which was incompatible.  CT myelogram done.  Neurology, neurosurgery, neurosurgery, oncology, radiation oncology consulted.  Back pain/lower extremity weakness suspected to be from metastatic bladder cancer.  Patient underwent cystoscopy with TURBT at [facility].    Assessment & Plan    Assessment and Plan:  Unable to ambulate/Paraplegia/ sudden onset:  Unclear etiology, probably from displaced disc at L4/5.  Slowly improving. Working with PT.  Differential include from gabapentin toxicity. Noncontrast CT of the thoracolumbar spine showed redemonstration of scattered sclerotic axial skeleton lesions suspicious for mets.Also showed L3-L4, L4-L5, L5-S1 disc bulge with question at least moderate central canal stenosis at the L4-L5 level.    Neuro surgery, Dr. Franky Macho consulted, reviewed the images and suggested no compressive lesions and recommended other etiologies to be investigated.  Neurology suggested stopping the gabapentin and reevaluate.  Radiation oncology consulted for evaluation of any role of XRT for the  bladder mass. will follow.  Empirically started her on IV decadron to see if her symptoms will improve. CK levels are low. Her symptoms are slowly improving.  Continue to work with PT. Therapy eval recommending SNF. Toc on board.      Compression fractures of T7, L1 and T5   CT thoracic spine shows The superior endplate compression fracture at L1 is more severe on the left. Possible sub acute  superior endplate fracture at T5 demonstrates 30-40% loss of height. Acute Superior endplate fracture at T7 is more acute, with 40-50% loss of height.  Discussed with IR for possible kyphoplasty for pain., requested MRI of the thoracic spine, ordered.    Bladder malignancy: CT imaging on March 4 showed concern for bladder wall thickening, neoplasm as well as obstructing mass near the right UVJ and hydronephrosis, concern for bony metastasis on the pubic rami or suspicious lesion. Severe hydroureteronephrosis.  Urology on board and she underwent cystoscopy with TURBT on 3/23 showing 5 cm bladder mass.  CT imaging on March 4 showed concern for bladder wall thickening, neoplasm as well as obstructing mass near the right UVJ and hydronephrosis, concern for bony metastasis on the pubic rami or suspicious lesion.  CT pelvis with out contrast  reviewed with the patient.  Pathology shows bladder malignancy.  She will need PET scan outpatient.     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  S/p right PCN by IR on 3/23.  Creatinine slowly improving. Creatinine at 1.57 today.  S/p foley catheter.  On sodium bicarbonate    Chronic diastolic CHF; Restart lasix.  Patient has pedal edema.     Lower quadrant abdominal pain:  Pain control with IV dilaudid and oxycodone.  Restarted her home meds.   COPD  No wheezing heard.    Leukocytosis Probably from steroids.    Anemia of chronic disease:  Monitor. No obvious signs of bleeding.    Chronic pain syndrome:  Restarted home meds as she was in tears earlier today from pain.   Therapy evaluations ordered. Recommending SNF.  Toc on board and aware.    Estimated body mass index is 33.73 kg/m as calculated from the following:   Height as of this  encounter: 4\' 10"  (1.473 m).   Weight as of this encounter: 73.2 kg.  Code Status: full code.  DVT Prophylaxis:  SCDs Start: 11/14/23 2033   Level of Care: Level of care: Med-Surg Family Communication: none at bedside.   Disposition Plan:     Remains inpatient appropriate:  clinical improvement. SNF placement.   Procedures:  CT pelvis   Consultants:   ONcology UROLOGY.  RADIATION ONCOLOGY GYN ONCOLOGY.  IR.   Antimicrobials:   Anti-infectives (From admission, onward)    Start     Dose/Rate Route Frequency Ordered Stop   11/16/23 1330  cefTRIAXone (ROCEPHIN) 2 g in sodium chloride 0.9 % 100 mL IVPB       Note to Pharmacy: Give in IR for surgical prophylaxis   2 g 200 mL/hr over 30 Minutes Intravenous On call 11/16/23 1322 11/17/23 1330   11/16/23 1003  ceFAZolin (ANCEF) 2-4 GM/100ML-% IVPB       Note to Pharmacy: Rodena Goldmann nan: cabinet override      11/16/23 1003 11/16/23 2214   11/14/23 2030  cefTRIAXone (ROCEPHIN) 1 g in sodium chloride 0.9 % 100 mL IVPB        1 g 200 mL/hr over 30 Minutes Intravenous Every 24 hours 11/14/23 2027 11/17/23 0745        Medications  Scheduled Meds:  ARIPiprazole  5 mg Oral Daily   Buprenorphine HCl  900 mcg Buccal Q12H   Chlorhexidine Gluconate Cloth  6 each Topical Daily   dexamethasone (DECADRON) injection  6 mg Intravenous Q24H   fluticasone furoate-vilanterol  1 puff Inhalation Daily   furosemide  40 mg Oral Daily   lidocaine  1 patch Transdermal Q24H   pantoprazole  40 mg Oral Daily   polyethylene glycol  17 g Oral Daily   ranolazine  500 mg Oral BID   rosuvastatin  20 mg Oral Daily   senna-docusate  2 tablet Oral BID   sertraline  50 mg Oral Daily   sodium bicarbonate  650 mg Oral BID   sodium chloride flush  3 mL Intravenous Q12H   sodium chloride flush  3-10 mL Intravenous Q12H   Continuous Infusions:   PRN Meds:.acetaminophen **OR** acetaminophen, fluticasone, HYDROmorphone (DILAUDID) injection,  ipratropium-albuterol, naLOXone (NARCAN)  injection, oxyCODONE    Subjective:   [name] was seen and examined today. Weakness improving. No paraesthesias.  No chest pain or sob. Pain slowly improving.   Objective:   Vitals:   11/18/23 1314 11/18/23 2139 11/19/23 0516 11/19/23 0855  BP: 122/70 114/72 110/65   Pulse: 76 80 62 76  Resp: 18 12 15 18   Temp: 97.7 F (36.5 C) 99.1 F (37.3 C) 98.2 F (36.8 C)   TempSrc: Oral Oral Oral   SpO2: 100% 96% 100% 99%  Weight:  Height:        Intake/Output Summary (Last 24 hours) at 11/19/2023 1308 Last data filed at 11/19/2023 0837 Gross per 24 hour  Intake 1812.78 ml  Output 910 ml  Net 902.78 ml   Filed Weights   11/14/23 1415 11/16/23 1231  Weight: 67 kg 73.2 kg     Exam General exam: Appears calm and comfortable  Respiratory system: Clear to auscultation. Respiratory effort normal. Cardiovascular system: S1 & S2 heard, RRR. No JVD,  Gastrointestinal system: Abdomen is nondistended, soft and nontender.  Central nervous system: Alert and oriented. Able to move all extremities.  Extremities: ankle edema present.  Skin: No rashes,  Psychiatry:  Mood & affect appropriate.     Data Reviewed:  I have personally reviewed following labs and imaging studies   CBC Lab Results  Component Value Date   WBC 10.7 (H) 11/19/2023   RBC 3.44 (L) 11/19/2023   HGB 10.2 (L) 11/19/2023   HCT 33.7 (L) 11/19/2023   MCV 98.0 11/19/2023   MCH 29.7 11/19/2023   PLT 287 11/19/2023   MCHC 30.3 11/19/2023   RDW 14.5 11/19/2023   LYMPHSABS 1.4 11/19/2023   MONOABS 0.9 11/19/2023   EOSABS 0.0 11/19/2023   BASOSABS 0.1 11/19/2023     Last metabolic panel Lab Results  Component Value Date   NA 140 11/19/2023   K 4.6 11/19/2023   CL 114 (H) 11/19/2023   CO2 19 (L) 11/19/2023   BUN 38 (H) 11/19/2023   CREATININE 1.57 (H) 11/19/2023   GLUCOSE 124 (H) 11/19/2023   GFRNONAA 35 (L) 11/19/2023   GFRAA 57 (L) 09/05/2020    CALCIUM 8.4 (L) 11/19/2023   PHOS 2.9 10/28/2020   PROT 7.6 11/14/2023   ALBUMIN 3.6 11/14/2023   LABGLOB 2.2 09/25/2023   AGRATIO 1.9 12/31/2022   BILITOT 0.5 11/14/2023   ALKPHOS 72 11/14/2023   AST 24 11/14/2023   ALT 15 11/14/2023   ANIONGAP 7 11/19/2023    CBG (last 3)  No results for input(s): "GLUCAP" in the last 72 hours.    Coagulation Profile: Recent Labs  Lab 11/15/23 0320  INR 1.1     Radiology Studies: CT PELVIS WO CONTRAST Result Date: 11/17/2023 CLINICAL DATA:  Bladder cancer to look for metastasis. EXAM: CT PELVIS WITHOUT CONTRAST TECHNIQUE: Multidetector CT imaging of the pelvis was performed following the standard protocol without intravenous contrast. RADIATION DOSE REDUCTION: This exam was performed according to the departmental dose-optimization program which includes automated exposure control, adjustment of the mA and/or kV according to patient size and/or use of iterative reconstruction technique. COMPARISON:  CT abdomen and pelvis 10/25/2020 FINDINGS: Urinary Tract: Foley catheter decompresses the bladder. Distal left ureter appears dilated, possibly reflux or obstruction. No stones are demonstrated. Bowel: Visualized small and large bowel are not abnormally distended. Prominent diverticulosis of the sigmoid colon. No definitive inflammatory changes to suggest acute diverticulitis. Vascular/Lymphatic: Mild calcification in the iliac arteries. No significant lymphadenopathy. Reproductive:  Uterus and ovaries are not enlarged. Other: Nonspecific soft tissue thickening in the presacral perirectal fat. This could represent a hematoma in the setting of sacral fractures. See below. Alternatively, this could represent postradiation or postsurgical change if there has been surgery or radiation to this area. Alternatively, infectious, inflammatory, or neoplastic etiologies could be considered. No discrete mass or fluid collection. Musculoskeletal: Postoperative right hip  arthroplasty. Degenerative changes in the lower lumbar spine. Nondisplaced fracture of the sacrum at the level of S2. This may represent a stress fracture or  insufficiency fracture. Old appearing fracture deformity of the left inferior pubic ramus. IMPRESSION: 1. Foley catheter decompresses the bladder, limiting evaluation. 2. No lymphadenopathy or soft tissue mass identified. 3. Colonic diverticulosis without evidence of acute diverticulitis. 4. Nonspecific soft tissue thickening in the presacral space. Possibly etiologies include infectious, inflammatory, postoperative/postradiation, or posttraumatic change. 5. Nondisplaced fractures of the sacrum at the level of S2, possibly stress or insufficiency fracture. No destructive bone lesions are identified. Electronically Signed   By: Burman Nieves M.D.   On: 11/17/2023 20:23       Kathlen Mody M.D. [facility] 11/19/2023, 1:08 PM  Available via Epic secure chat 7am-7pm After 7 pm, please refer to night coverage provider listed on amion.

## 2023-11-19 NOTE — Progress Notes (Signed)
 [name]   DOB:1955-11-11   UX#:[id number]    ASSESSMENT & PLAN:  68 y.o.female with past medical history of COPD presenting with back pain radiating down to the hip 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  We are consulted for this reason.  CT showed T5, T7, L1, S2 compression fracture and sclerotic lesion T2, T3.  Cystoscopy with TURBT show right sided bladder tumor with palpable anterior vaginal mass.  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   Discussed with patient today. Staging is unclear. Her compression fractures seems to be from long standing osteoporosis she is aware of. No significant sclerotic lesions reported other than T2/3. Will see if we can get a biopsy. She lives in [place]. Otherwise she has good PS prior to last week.  Biopsy of T3/4 area sclerotic lesion not recommended by IR. Discussed vertebroplaty for back pain and if able to obtain tissue may help diagnosis. If tissue is positive for UC, then PET will not be needed. If not, will obtain outpatient PET.  Bone lesions Unclear etiology.  Recommend tissue from kyphoplasty for compression fracture if able for diagnosis.  Urothelial carcinoma of bladder (HCC) Newly diagnosed. Staging not entirely clear with a few sclerotic lesion. pending bone scan.  If cannot get tissue diagnosis, will have PET as outpatient.   AKI (acute kidney injury) (HCC) Improved after nephrostomy tube and TURBT Continue adequate hydration  Paraplegia (HCC) Improving weakness. 4/4 in LE today  All questions were answered.  Communicated with primary team, IR.  Thank you for the consult. Will follow with you.  Melven Sartorius, MD 11/19/2023 1:42 PM  Subjective:  [name] reports feeling about the same. She has lower back pain. Strength is about the same, right a bit more than left on the LE. No abdominal pain. No hematuria.  Objective:  Vitals:   11/19/23 0855 11/19/23 1310  BP:  114/63  Pulse: 76 80  Resp:  18 16  Temp:  99 F (37.2 C)  SpO2: 99% 99%     Intake/Output Summary (Last 24 hours) at 11/19/2023 1342 Last data filed at 11/19/2023 1313 Gross per 24 hour  Intake 1812.78 ml  Output 1135 ml  Net 677.78 ml    GENERAL: alert, no distress and comfortable SKIN: skin color normal EYES: normal, sclera clear LUNGS: normal breathing effort.  ABDOMEN: abdomen soft, non-tender and non-distended GU: Nephrostomy tube. No hematuria Musculoskeletal: no lower extremity edema NEURO: alert with fluent speech, 4/4 in lower extremities   Labs:  Recent Labs    06/23/23 1516 09/25/23 1449 11/14/23 1649 11/15/23 0320 11/17/23 0350 11/18/23 1331 11/19/23 0413  NA 142 139 135   < > 140 140 140  K 5.2 4.6 4.5   < > 4.5 4.4 4.6  CL 109* 108* 101   < > 112* 112* 114*  CO2 20 16* 19*   < > 19* 20* 19*  GLUCOSE 88 131* 147*   < > 96 90 124*  BUN 20 32* 83*   < > 54* 45* 38*  CREATININE 1.76* 1.76* 2.79*   < > 1.80* 1.83* 1.57*  CALCIUM 9.7 9.9 9.5   < > 8.4* 8.5* 8.4*  GFRNONAA  --   --  18*   < > 30* 30* 35*  PROT 6.6 6.5 7.6  --   --   --   --   ALBUMIN 4.2 4.3 3.6  --   --   --   --   AST 13  12 24  --   --   --   --   ALT 5 7 15   --   --   --   --   ALKPHOS 63 78 72  --   --   --   --   BILITOT 0.3 0.3 0.5  --   --   --   --    < > = values in this interval not displayed.    Studies:  CT PELVIS WO CONTRAST Result Date: 11/17/2023 CLINICAL DATA:  Bladder cancer to look for metastasis. EXAM: CT PELVIS WITHOUT CONTRAST TECHNIQUE: Multidetector CT imaging of the pelvis was performed following the standard protocol without intravenous contrast. RADIATION DOSE REDUCTION: This exam was performed according to the departmental dose-optimization program which includes automated exposure control, adjustment of the mA and/or kV according to patient size and/or use of iterative reconstruction technique. COMPARISON:  CT abdomen and pelvis 10/25/2020 FINDINGS: Urinary Tract: Foley catheter decompresses  the bladder. Distal left ureter appears dilated, possibly reflux or obstruction. No stones are demonstrated. Bowel: Visualized small and large bowel are not abnormally distended. Prominent diverticulosis of the sigmoid colon. No definitive inflammatory changes to suggest acute diverticulitis. Vascular/Lymphatic: Mild calcification in the iliac arteries. No significant lymphadenopathy. Reproductive:  Uterus and ovaries are not enlarged. Other: Nonspecific soft tissue thickening in the presacral perirectal fat. This could represent a hematoma in the setting of sacral fractures. See below. Alternatively, this could represent postradiation or postsurgical change if there has been surgery or radiation to this area. Alternatively, infectious, inflammatory, or neoplastic etiologies could be considered. No discrete mass or fluid collection. Musculoskeletal: Postoperative right hip arthroplasty. Degenerative changes in the lower lumbar spine. Nondisplaced fracture of the sacrum at the level of S2. This may represent a stress fracture or insufficiency fracture. Old appearing fracture deformity of the left inferior pubic ramus. IMPRESSION: 1. Foley catheter decompresses the bladder, limiting evaluation. 2. No lymphadenopathy or soft tissue mass identified. 3. Colonic diverticulosis without evidence of acute diverticulitis. 4. Nonspecific soft tissue thickening in the presacral space. Possibly etiologies include infectious, inflammatory, postoperative/postradiation, or posttraumatic change. 5. Nondisplaced fractures of the sacrum at the level of S2, possibly stress or insufficiency fracture. No destructive bone lesions are identified. Electronically Signed   By: Burman Nieves M.D.   On: 11/17/2023 20:23   IR NEPHROSTOMY PLACEMENT RIGHT Result Date: 11/16/2023 INDICATION: 68 year old female with vaginal/bladder mass and right distal ureteral obstruction. Cystoscopy in attempted ureteral stent placement was unsuccessful.  Therefore, patient requires placement of a percutaneous nephrostomy tube. EXAM: IR NEPHROSTOMY PLACEMENT RIGHT COMPARISON:  None Available. MEDICATIONS: No additional antibiotics administered. ANESTHESIA/SEDATION: Fentanyl 2 mcg IV; Versed 100 mg IV administered by the radiology nurse Moderate Sedation Time:  10 minutes The patient's vital signs and level of consciousness were continuously monitored during the procedure by the interventional radiology nurse under my direct supervision. CONTRAST:  15mL OMNIPAQUE IOHEXOL 300 MG/ML SOLN - administered into the collecting system(s) FLUOROSCOPY: Radiation exposure index: 2 mGy reference air kerma COMPLICATIONS: None immediate. TECHNIQUE: The procedure, risks, benefits, and alternatives were explained to the patient. Questions regarding the procedure were encouraged and answered. The patient understands and consents to the procedure. The right flank was prepped with chlorhexidine in a sterile fashion, and a sterile drape was applied covering the operative field. A sterile gown and sterile gloves were used for the procedure. Local anesthesia was provided with 1% Lidocaine. The right flank was interrogated with ultrasound and the left kidney identified. The  kidney is hydronephrotic. A suitable access site on the skin overlying the lower pole, posterior calix was identified. After local mg anesthesia was achieved, a small skin nick was made with an 11 blade scalpel. A 21 gauge Accustick needle was then advanced under direct sonographic guidance into the lower pole of the right kidney. A 0.018 inch wire was advanced under fluoroscopic guidance into the left renal collecting system. The Accustick sheath was then advanced over the wire and a 0.018 system exchanged for a 0.035 system. Gentle hand injection of contrast material confirms placement of the sheath within the renal collecting system. There is severe hydronephrosis. The tract from the scan into the renal collecting  system was then dilated serially to 10-French. A 10-French Cook all-purpose drain was then placed and positioned under fluoroscopic guidance. The locking loop is well formed within the left renal pelvis. The catheter was secured to the skin with 2-0 Prolene and a sterile bandage was placed. Catheter was left to gravity bag drainage. IMPRESSION: Successful placement of a right 10 French percutaneous nephrostomy tube. Electronically Signed   By: Malachy Moan M.D.   On: 11/16/2023 15:43   DG C-Arm 1-60 Min-No Report Result Date: 11/16/2023 Fluoroscopy was utilized by the requesting physician.  No radiographic interpretation.   DG C-Arm 1-60 Min-No Report Result Date: 11/16/2023 Fluoroscopy was utilized by the requesting physician.  No radiographic interpretation.   CT THORACIC SPINE WO CONTRAST Result Date: 11/15/2023 CLINICAL DATA:  Back pain. Lower extremity weakness. Question cauda acquired syndrome. EXAM: CT MYELOGRAPHY LUMBAR SPINE TECHNIQUE: CT imaging of the thoracic and lumbar spine was performed after Isovue 300M contrast administration. Multiplanar CT image reconstructions were also generated. RADIATION DOSE REDUCTION: This exam was performed according to the departmental dose-optimization program which includes automated exposure control, adjustment of the mA and/or kV according to patient size and/or use of iterative reconstruction technique. COMPARISON:  CT of the thoracic and lumbar spine 11/14/2023. FINDINGS: Thoracic spine Alignment: No significant listhesis is present. Mild rightward curvature is centered at T6. Mild straightening of the normal thoracic kyphosis is stable. Vertebrae: The superior endplate compression fracture at L1 is more severe on the left. Slight retropulsed bone is present without focal stenosis. The superior endplate fracture at T5 demonstrates 30-40% loss of height. No retropulsed bone is present. This may be subacute. Superior endplate fracture at T7 is more acute,  with 40-50% loss of height. Slight retropulsed bone is present without significant stenosis. Progressive collapse is present at this level. Sclerotic lesions are present on the left at T2 and T3 extending into the left pedicle at T3. Conus medullaris: Extends to the  level and appears normal. Paraspinal and other soft tissues: The paraspinous soft tissues are within normal limits. The visualized lung fields are clear. Pacing wires are in place. Severe right-sided hydronephrosis is again noted. Disc levels: No significant central canal stenosis or cord compression is present. Mild right foraminal narrowing present at T8-9, T9-10 and T10-11. Mild left foraminal narrowing present at T5-6 and T6-7. Lumbar spine Segmentation: 5 non rib-bearing lumbar type vertebral bodies are present. The lowest fully formed vertebral body is L5. Alignment: No significant listhesis is present. Levoconvex curvature is centered at L1-2. Mild straightening of the normal lumbar lordosis is present. Vertebrae: Vertebral body heights are normal. No acute or healing fractures are present. Conus medullaris: Extends to the T12-L1   Level and appears normal. Paraspinal and other soft tissues: Atherosclerotic calcifications are present the aorta and branch vessels. No adenopathy  is present. Severe right-sided hydronephrosis is again noted. Disc levels: L1-2: Normal disc signal and height is present. No focal protrusion or stenosis is present. L2-3: Normal disc signal and height is present. No focal protrusion or stenosis is present. L3-4: A mild broad-based disc bulge is present. Mild facet hypertrophy is noted bilaterally. Disc material extends into the foramina bilaterally. Mild right foraminal narrowing is present. L4-5: A leftward disc protrusion is present. Moderate left subarticular and foraminal narrowing is present. Mild right foraminal narrowing is present. L5-S1: Chronic loss of disc height is present. A leftward disc protrusion likely  contacts the traversing left S1 nerve roots. Facet spurring contributes to mild foraminal narrowing bilaterally, left greater than right. IMPRESSION: 1. No significant cord compression or central canal stenosis to explain the patient's symptoms. 2. Acute superior endplate compression fracture at T7 with 40-50% loss of height. Slight retropulsed bone is present without significant stenosis. 3. Superior endplate compression fracture at T5 with 30-40% loss of height. No retropulsed bone is present. This may be subacute. 4. Superior endplate compression fracture at L1 is more severe on the left. Slight retropulsed bone is present without focal stenosis. 5. Sclerotic lesions on the left at T2 and T3 extending into the left pedicle at T3. These are concerning for metastatic disease. 6. Mild right foraminal narrowing at T8-9, T9-10 and T10-11. 7. Mild left foraminal narrowing at T5-6 and T6-7. 8. Moderate left subarticular and foraminal narrowing at L4-5. 9. Mild foraminal narrowing bilaterally at L5-S1, left greater than right. 10. Severe right-sided hydronephrosis is again noted. 11.  Aortic Atherosclerosis (ICD10-I70.0). These results were called by telephone at the time of interpretation on 11/15/2023 at 1:50 pm to Dr. Coletta Memos, Who verbally acknowledged these results. Electronically Signed   By: Marin Roberts M.D.   On: 11/15/2023 15:31   CT LUMBAR SPINE WO CONTRAST Result Date: 11/15/2023 CLINICAL DATA:  Back pain. Lower extremity weakness. Question cauda acquired syndrome. EXAM: CT MYELOGRAPHY LUMBAR SPINE TECHNIQUE: CT imaging of the thoracic and lumbar spine was performed after Isovue 300M contrast administration. Multiplanar CT image reconstructions were also generated. RADIATION DOSE REDUCTION: This exam was performed according to the departmental dose-optimization program which includes automated exposure control, adjustment of the mA and/or kV according to patient size and/or use of iterative  reconstruction technique. COMPARISON:  CT of the thoracic and lumbar spine 11/14/2023. FINDINGS: Thoracic spine Alignment: No significant listhesis is present. Mild rightward curvature is centered at T6. Mild straightening of the normal thoracic kyphosis is stable. Vertebrae: The superior endplate compression fracture at L1 is more severe on the left. Slight retropulsed bone is present without focal stenosis. The superior endplate fracture at T5 demonstrates 30-40% loss of height. No retropulsed bone is present. This may be subacute. Superior endplate fracture at T7 is more acute, with 40-50% loss of height. Slight retropulsed bone is present without significant stenosis. Progressive collapse is present at this level. Sclerotic lesions are present on the left at T2 and T3 extending into the left pedicle at T3. Conus medullaris: Extends to the  level and appears normal. Paraspinal and other soft tissues: The paraspinous soft tissues are within normal limits. The visualized lung fields are clear. Pacing wires are in place. Severe right-sided hydronephrosis is again noted. Disc levels: No significant central canal stenosis or cord compression is present. Mild right foraminal narrowing present at T8-9, T9-10 and T10-11. Mild left foraminal narrowing present at T5-6 and T6-7. Lumbar spine Segmentation: 5 non rib-bearing lumbar type vertebral  bodies are present. The lowest fully formed vertebral body is L5. Alignment: No significant listhesis is present. Levoconvex curvature is centered at L1-2. Mild straightening of the normal lumbar lordosis is present. Vertebrae: Vertebral body heights are normal. No acute or healing fractures are present. Conus medullaris: Extends to the T12-L1   Level and appears normal. Paraspinal and other soft tissues: Atherosclerotic calcifications are present the aorta and branch vessels. No adenopathy is present. Severe right-sided hydronephrosis is again noted. Disc levels: L1-2: Normal disc  signal and height is present. No focal protrusion or stenosis is present. L2-3: Normal disc signal and height is present. No focal protrusion or stenosis is present. L3-4: A mild broad-based disc bulge is present. Mild facet hypertrophy is noted bilaterally. Disc material extends into the foramina bilaterally. Mild right foraminal narrowing is present. L4-5: A leftward disc protrusion is present. Moderate left subarticular and foraminal narrowing is present. Mild right foraminal narrowing is present. L5-S1: Chronic loss of disc height is present. A leftward disc protrusion likely contacts the traversing left S1 nerve roots. Facet spurring contributes to mild foraminal narrowing bilaterally, left greater than right. IMPRESSION: 1. No significant cord compression or central canal stenosis to explain the patient's symptoms. 2. Acute superior endplate compression fracture at T7 with 40-50% loss of height. Slight retropulsed bone is present without significant stenosis. 3. Superior endplate compression fracture at T5 with 30-40% loss of height. No retropulsed bone is present. This may be subacute. 4. Superior endplate compression fracture at L1 is more severe on the left. Slight retropulsed bone is present without focal stenosis. 5. Sclerotic lesions on the left at T2 and T3 extending into the left pedicle at T3. These are concerning for metastatic disease. 6. Mild right foraminal narrowing at T8-9, T9-10 and T10-11. 7. Mild left foraminal narrowing at T5-6 and T6-7. 8. Moderate left subarticular and foraminal narrowing at L4-5. 9. Mild foraminal narrowing bilaterally at L5-S1, left greater than right. 10. Severe right-sided hydronephrosis is again noted. 11.  Aortic Atherosclerosis (ICD10-I70.0). These results were called by telephone at the time of interpretation on 11/15/2023 at 1:50 pm to Dr. Coletta Memos, Who verbally acknowledged these results. Electronically Signed   By: Marin Roberts M.D.   On: 11/15/2023  15:31   DG MYELOGRAPHY LUMBAR INJ MULTI REGION Result Date: 11/15/2023 CLINICAL DATA:  68 year old female with history of back pain, lower extremity weakness. Contrast injection for CT myelogram requested. EXAM: LUMBAR MYELOGRAM FLUOROSCOPY: dictate in minutes and seconds PROCEDURE: After thorough discussion of risks and benefits of the procedure including bleeding, infection, injury to nerves, blood vessels, adjacent structures as well as headache and CSF leak, written and oral informed consent was obtained. Time out form was completed. Patient was positioned prone on the fluoroscopy table. Local anesthesia was provided with 1% lidocaine without epinephrine after prepped and draped in the usual sterile fashion. Puncture was performed at L2-L3 using a 3 1/2 inch 20-gauge spinal needle via left approach. Using a single pass through the dura, the needle was placed within the thecal sac, with return of clear CSF. 15 mL of Omnipaque 180 was injected into the thecal sac, with normal opacification of the nerve roots and cauda equina consistent with free flow within the subarachnoid space. FINDINGS: Contiguous axial images were obtained through the Lumbar spine after the intrathecal infusion with appropriate contrast dispersal. IMPRESSION: Successful intrathecal administration of contrast for CT myelogram. Performed by: Loyce Dys PA-C Supervised by Dr. Chryl Heck, MD Electronically Signed  By: Malachy Moan M.D.   On: 11/15/2023 14:34   CT THORACIC SPINE WO CONTRAST Result Date: 11/14/2023 CLINICAL DATA:  Mid-back pain, abnormal neuro, positive xray (Ped 0-17y); Low back pain, cauda equina syndrome suspected EXAM: CT THORACIC AND LUMBAR SPINE WITHOUT CONTRAST TECHNIQUE: Multidetector CT imaging of the thoracic and lumbar spine was performed without contrast. Multiplanar CT image reconstructions were also generated. RADIATION DOSE REDUCTION: This exam was performed according to the departmental  dose-optimization program which includes automated exposure control, adjustment of the mA and/or kV according to patient size and/or use of iterative reconstruction technique. COMPARISON:  MRI lumbar spine 12/12/2008, CT chest 10/17/2023: CT abdomen pelvis 10/28/2023 FINDINGS: CT THORACIC SPINE FINDINGS Alignment: Normal. Vertebrae: Diffusely decreased bone density. Interval development of an age-indeterminate T1 compression fracture with at least 40% vertebral body height loss. Chronic stable T5, T7 compression fracture. Redemonstration of scattered sclerotic axial skeleton lesions. Multilevel mild degenerative changes of the spine. Paraspinal and other soft tissues: Negative. Disc levels: Maintained. CT LUMBAR SPINE FINDINGS Segmentation: 5 lumbar type vertebrae. Alignment: Normal. Vertebrae: Diffusely decreased bone density. No severe osseous neural foraminal or central canal stenosis. L3-L4, L4-L5, L5-S1 disc bulge with question at least moderate central canal stenosis at the L4-L5 level. No acute fracture or focal pathologic process. Paraspinal and other soft tissues: Negative. Disc levels: Maintained. Other:. Diffuse bronchial wall thickening. Limited evaluation of the lungs due to motion artifact. Severe right hydroureteronephrosis that is partially visualized. Atherosclerotic plaque. Cardiac leads. Status post cholecystectomy. Acute minimally displaced right posterior first rib fracture (5:21). IMPRESSION: IMPRESSION CT THORACIC SPINE IMPRESSION 1. No acute displaced fracture or traumatic listhesis of the thoracic spine. 2. Redemonstration of scattered sclerotic axial skeleton lesions. Question metastases. CT LUMBAR SPINE IMPRESSION 1. No acute displaced fracture or traumatic listhesis of the lumbar spine. 2. L3-L4, L4-L5, L5-S1 disc bulge with question at least moderate central canal stenosis at the L4-L5 level. Recommend MRI for further evaluation. Other imaging findings of potential clinical significance:  1. Acute minimally displaced right posterior first rib fracture 2. Severe right hydroureteronephrosis partially visualized. Finding better evaluated on CT abdomen pelvis 10/28/2023 3.  Aortic Atherosclerosis (ICD10-I70.0). Electronically Signed   By: Tish Frederickson M.D.   On: 11/14/2023 20:53   CT LUMBAR SPINE WO CONTRAST Result Date: 11/14/2023 CLINICAL DATA:  Mid-back pain, abnormal neuro, positive xray (Ped 0-17y); Low back pain, cauda equina syndrome suspected EXAM: CT THORACIC AND LUMBAR SPINE WITHOUT CONTRAST TECHNIQUE: Multidetector CT imaging of the thoracic and lumbar spine was performed without contrast. Multiplanar CT image reconstructions were also generated. RADIATION DOSE REDUCTION: This exam was performed according to the departmental dose-optimization program which includes automated exposure control, adjustment of the mA and/or kV according to patient size and/or use of iterative reconstruction technique. COMPARISON:  MRI lumbar spine 12/12/2008, CT chest 10/17/2023: CT abdomen pelvis 10/28/2023 FINDINGS: CT THORACIC SPINE FINDINGS Alignment: Normal. Vertebrae: Diffusely decreased bone density. Interval development of an age-indeterminate T1 compression fracture with at least 40% vertebral body height loss. Chronic stable T5, T7 compression fracture. Redemonstration of scattered sclerotic axial skeleton lesions. Multilevel mild degenerative changes of the spine. Paraspinal and other soft tissues: Negative. Disc levels: Maintained. CT LUMBAR SPINE FINDINGS Segmentation: 5 lumbar type vertebrae. Alignment: Normal. Vertebrae: Diffusely decreased bone density. No severe osseous neural foraminal or central canal stenosis. L3-L4, L4-L5, L5-S1 disc bulge with question at least moderate central canal stenosis at the L4-L5 level. No acute fracture or focal pathologic process. Paraspinal and other soft tissues: Negative.  Disc levels: Maintained. Other:. Diffuse bronchial wall thickening. Limited  evaluation of the lungs due to motion artifact. Severe right hydroureteronephrosis that is partially visualized. Atherosclerotic plaque. Cardiac leads. Status post cholecystectomy. Acute minimally displaced right posterior first rib fracture (5:21). IMPRESSION: IMPRESSION CT THORACIC SPINE IMPRESSION 1. No acute displaced fracture or traumatic listhesis of the thoracic spine. 2. Redemonstration of scattered sclerotic axial skeleton lesions. Question metastases. CT LUMBAR SPINE IMPRESSION 1. No acute displaced fracture or traumatic listhesis of the lumbar spine. 2. L3-L4, L4-L5, L5-S1 disc bulge with question at least moderate central canal stenosis at the L4-L5 level. Recommend MRI for further evaluation. Other imaging findings of potential clinical significance: 1. Acute minimally displaced right posterior first rib fracture 2. Severe right hydroureteronephrosis partially visualized. Finding better evaluated on CT abdomen pelvis 10/28/2023 3.  Aortic Atherosclerosis (ICD10-I70.0). Electronically Signed   By: Tish Frederickson M.D.   On: 11/14/2023 20:53   DG Chest 2 View Result Date: 11/06/2023 CLINICAL DATA:  Chest pain.  COPD. EXAM: CHEST - 2 VIEW COMPARISON:  October 10, 2023. FINDINGS: Stable cardiomediastinal silhouette. Left-sided defibrillator is unchanged. Both lungs are clear. The visualized skeletal structures are unremarkable. IMPRESSION: No active cardiopulmonary disease. Electronically Signed   By: Lupita Raider M.D.   On: 11/06/2023 17:09   [facility] INCLINIC DEVICE CHECK Result Date: 10/20/2023 Normal in-clinic ICD check. Thresholds, sensing, and impedance WNL or stable for patient over time. Brief AT/NSVT episodes. Estimated longevity 6.9 years.  Referred to [facility] clinic for fluid management.  Pt enrolled in remote follow-up.Ancil Boozer, BSN, RN

## 2023-11-20 DIAGNOSIS — C679 Malignant neoplasm of bladder, unspecified: Secondary | ICD-10-CM | POA: Diagnosis not present

## 2023-11-20 DIAGNOSIS — G8220 Paraplegia, unspecified: Secondary | ICD-10-CM | POA: Diagnosis not present

## 2023-11-20 DIAGNOSIS — M899 Disorder of bone, unspecified: Secondary | ICD-10-CM | POA: Diagnosis not present

## 2023-11-20 LAB — BASIC METABOLIC PANEL WITH GFR
Anion gap: 9 (ref 5–15)
BUN: 26 mg/dL — ABNORMAL HIGH (ref 8–23)
CO2: 18 mmol/L — ABNORMAL LOW (ref 22–32)
Calcium: 8.5 mg/dL — ABNORMAL LOW (ref 8.9–10.3)
Chloride: 110 mmol/L (ref 98–111)
Creatinine, Ser: 1.31 mg/dL — ABNORMAL HIGH (ref 0.44–1.00)
GFR, Estimated: 44 mL/min — ABNORMAL LOW (ref >60)
Glucose, Bld: 115 mg/dL — ABNORMAL HIGH (ref 70–99)
Potassium: 4.3 mmol/L (ref 3.5–5.1)
Sodium: 137 mmol/L (ref 135–145)

## 2023-11-20 LAB — VITAMIN B12: Vitamin B-12: 211 pg/mL (ref 180–914)

## 2023-11-20 LAB — FOLATE: Folate: 5.4 ng/mL — ABNORMAL LOW

## 2023-11-20 NOTE — Progress Notes (Signed)
 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  We are consulted for this reason.  CT showed T5, T7, L1, S2 compression fracture and sclerotic lesion T2, T3.  Cystoscopy with TURBT show right sided bladder tumor with palpable anterior vaginal mass.  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   Staging is unclear. Her compression fractures seems to be from long standing osteoporosis she is aware of. No significant sclerotic lesions reported other than T2/3. She lives in [place].   Biopsy of T3/4 area sclerotic lesion not recommended by IR. She has no thoracic area pain but more lower back so kyphoplasty may not be helpful. Discussed with IR potential biopsy site for diagnosis.   She has improvement of weakness and up with walker today.  Bone lesions Unclear etiology.  Recommend tissue if able for diagnosis.  Urothelial carcinoma of bladder (HCC) Newly diagnosed. Staging not entirely clear with a few sclerotic lesion. pending bone scan.  If cannot get tissue diagnosis or non-diagnostic, will have PET as outpatient.   AKI (acute kidney injury) (HCC) Improved after nephrostomy tube and TURBT Continue adequate hydration  Paraplegia (HCC) Improving weakness. 4/4 in LE today  All questions were answered.  Communicated with primary team, IR.  Thank you for the consult. Will follow with you.  Melven Sartorius, MD 11/20/2023 11:25 PM  Subjective:  [name] reports feeling better today and was able to use a walker. She has lower back pain but no thoracic area pain. No abdominal pain. No hematuria.  Objective:  Vitals:   11/20/23 1351 11/20/23 2041  BP: 125/69 115/60  Pulse: 86 84  Resp: 18 15  Temp: 98.2 F (36.8 C) 98.4 F (36.9 C)  SpO2: 98% 97%     Intake/Output Summary (Last 24 hours) at  11/20/2023 2325 Last data filed at 11/20/2023 1659 Gross per 24 hour  Intake 245 ml  Output 1450 ml  Net -1205 ml    GENERAL: alert, no distress and comfortable SKIN: skin color normal EYES: normal, sclera clear LUNGS: normal breathing effort.  ABDOMEN: abdomen soft, non-tender and non-distended GU: Nephrostomy tube. Foley. Musculoskeletal: no lower extremity edema    Labs:  Recent Labs    06/23/23 1516 09/25/23 1449 11/14/23 1649 11/15/23 0320 11/18/23 1331 11/19/23 0413 11/20/23 0413  NA 142 139 135   < > 140 140 137  K 5.2 4.6 4.5   < > 4.4 4.6 4.3  CL 109* 108* 101   < > 112* 114* 110  CO2 20 16* 19*   < > 20* 19* 18*  GLUCOSE 88 131* 147*   < > 90 124* 115*  BUN 20 32* 83*   < > 45* 38* 26*  CREATININE 1.76* 1.76* 2.79*   < > 1.83* 1.57* 1.31*  CALCIUM 9.7 9.9 9.5   < > 8.5* 8.4* 8.5*  GFRNONAA  --   --  18*   < > 30* 35* 44*  PROT 6.6 6.5 7.6  --   --   --   --   ALBUMIN 4.2 4.3 3.6  --   --   --   --   AST 13 12 24   --   --   --   --   ALT 5 7 15   --   --   --   --  ALKPHOS 63 78 72  --   --   --   --   BILITOT 0.3 0.3 0.5  --   --   --   --    < > = values in this interval not displayed.    Studies:  CT THORACIC SPINE WO CONTRAST Addendum Date: 11/20/2023 ADDENDUM REPORT: 11/20/2023 16:04 ADDENDUM: And expansile lytic lesion is noted posteriorly on the left at L1, along the posterior margin the fracture. This extends into the left pedicle and left facet. The lesion extends into the left transverse process. Note is also made of a sacral insufficiency fracture involving the sacral ala bilaterally and transversely at S2. Electronically Signed   By: Marin Roberts M.D.   On: 11/20/2023 16:04   Result Date: 11/20/2023 CLINICAL DATA:  Mid-back pain, abnormal neuro, positive xray (Ped 0-17y); Low back pain, cauda equina syndrome suspected EXAM: CT THORACIC AND LUMBAR SPINE WITHOUT CONTRAST TECHNIQUE: Multidetector CT imaging of the thoracic and lumbar spine was  performed without contrast. Multiplanar CT image reconstructions were also generated. RADIATION DOSE REDUCTION: This exam was performed according to the departmental dose-optimization program which includes automated exposure control, adjustment of the mA and/or kV according to patient size and/or use of iterative reconstruction technique. COMPARISON:  MRI lumbar spine 12/12/2008, CT chest 10/17/2023: CT abdomen pelvis 10/28/2023 FINDINGS: CT THORACIC SPINE FINDINGS Alignment: Normal. Vertebrae: Diffusely decreased bone density. Interval development of an age-indeterminate T1 compression fracture with at least 40% vertebral body height loss. Chronic stable T5, T7 compression fracture. Redemonstration of scattered sclerotic axial skeleton lesions. Multilevel mild degenerative changes of the spine. Paraspinal and other soft tissues: Negative. Disc levels: Maintained. CT LUMBAR SPINE FINDINGS Segmentation: 5 lumbar type vertebrae. Alignment: Normal. Vertebrae: Diffusely decreased bone density. No severe osseous neural foraminal or central canal stenosis. L3-L4, L4-L5, L5-S1 disc bulge with question at least moderate central canal stenosis at the L4-L5 level. No acute fracture or focal pathologic process. Paraspinal and other soft tissues: Negative. Disc levels: Maintained. Other:. Diffuse bronchial wall thickening. Limited evaluation of the lungs due to motion artifact. Severe right hydroureteronephrosis that is partially visualized. Atherosclerotic plaque. Cardiac leads. Status post cholecystectomy. Acute minimally displaced right posterior first rib fracture (5:21). IMPRESSION: IMPRESSION CT THORACIC SPINE IMPRESSION 1. No acute displaced fracture or traumatic listhesis of the thoracic spine. 2. Redemonstration of scattered sclerotic axial skeleton lesions. Question metastases. CT LUMBAR SPINE IMPRESSION 1. No acute displaced fracture or traumatic listhesis of the lumbar spine. 2. L3-L4, L4-L5, L5-S1 disc bulge with  question at least moderate central canal stenosis at the L4-L5 level. Recommend MRI for further evaluation. Other imaging findings of potential clinical significance: 1. Acute minimally displaced right posterior first rib fracture 2. Severe right hydroureteronephrosis partially visualized. Finding better evaluated on CT abdomen pelvis 10/28/2023 3.  Aortic Atherosclerosis (ICD10-I70.0). Electronically Signed: By: Tish Frederickson M.D. On: 11/14/2023 20:53   CT LUMBAR SPINE WO CONTRAST Addendum Date: 11/20/2023 ADDENDUM REPORT: 11/20/2023 16:04 ADDENDUM: And expansile lytic lesion is noted posteriorly on the left at L1, along the posterior margin the fracture. This extends into the left pedicle and left facet. The lesion extends into the left transverse process. Note is also made of a sacral insufficiency fracture involving the sacral ala bilaterally and transversely at S2. Electronically Signed   By: Marin Roberts M.D.   On: 11/20/2023 16:04   Result Date: 11/20/2023 CLINICAL DATA:  Mid-back pain, abnormal neuro, positive xray (Ped 0-17y); Low back pain, cauda equina syndrome suspected EXAM:  CT THORACIC AND LUMBAR SPINE WITHOUT CONTRAST TECHNIQUE: Multidetector CT imaging of the thoracic and lumbar spine was performed without contrast. Multiplanar CT image reconstructions were also generated. RADIATION DOSE REDUCTION: This exam was performed according to the departmental dose-optimization program which includes automated exposure control, adjustment of the mA and/or kV according to patient size and/or use of iterative reconstruction technique. COMPARISON:  MRI lumbar spine 12/12/2008, CT chest 10/17/2023: CT abdomen pelvis 10/28/2023 FINDINGS: CT THORACIC SPINE FINDINGS Alignment: Normal. Vertebrae: Diffusely decreased bone density. Interval development of an age-indeterminate T1 compression fracture with at least 40% vertebral body height loss. Chronic stable T5, T7 compression fracture. Redemonstration  of scattered sclerotic axial skeleton lesions. Multilevel mild degenerative changes of the spine. Paraspinal and other soft tissues: Negative. Disc levels: Maintained. CT LUMBAR SPINE FINDINGS Segmentation: 5 lumbar type vertebrae. Alignment: Normal. Vertebrae: Diffusely decreased bone density. No severe osseous neural foraminal or central canal stenosis. L3-L4, L4-L5, L5-S1 disc bulge with question at least moderate central canal stenosis at the L4-L5 level. No acute fracture or focal pathologic process. Paraspinal and other soft tissues: Negative. Disc levels: Maintained. Other:. Diffuse bronchial wall thickening. Limited evaluation of the lungs due to motion artifact. Severe right hydroureteronephrosis that is partially visualized. Atherosclerotic plaque. Cardiac leads. Status post cholecystectomy. Acute minimally displaced right posterior first rib fracture (5:21). IMPRESSION: IMPRESSION CT THORACIC SPINE IMPRESSION 1. No acute displaced fracture or traumatic listhesis of the thoracic spine. 2. Redemonstration of scattered sclerotic axial skeleton lesions. Question metastases. CT LUMBAR SPINE IMPRESSION 1. No acute displaced fracture or traumatic listhesis of the lumbar spine. 2. L3-L4, L4-L5, L5-S1 disc bulge with question at least moderate central canal stenosis at the L4-L5 level. Recommend MRI for further evaluation. Other imaging findings of potential clinical significance: 1. Acute minimally displaced right posterior first rib fracture 2. Severe right hydroureteronephrosis partially visualized. Finding better evaluated on CT abdomen pelvis 10/28/2023 3.  Aortic Atherosclerosis (ICD10-I70.0). Electronically Signed: By: Tish Frederickson M.D. On: 11/14/2023 20:53   NM Bone Scan Whole Body Result Date: 11/19/2023 CLINICAL DATA:  Bladder cancer. Severe low back pain that radiates into the bilateral legs. EXAM: NUCLEAR MEDICINE WHOLE BODY BONE SCAN TECHNIQUE: Whole body anterior and posterior images were  obtained approximately 3 hours after intravenous injection of radiopharmaceutical. RADIOPHARMACEUTICALS:  19.6 mCi Technetium-32m MDP IV COMPARISON:  CT chest 10/17/2023 and CT abdomen pelvis 10/28/2023 and CT spine 11/15/2023 FINDINGS: Foci of increased uptake involving the T5 and T7 vertebral bodies are compatible with the known compression fractures. Subtle areas of increased uptake along the left side of T2 and T3 may correspond with the areas of sclerosis on the previous cross-sectional imaging. Increased uptake near T1 corresponds with a compression fracture based on the CT from 11/15/2023. Focus of increased uptake involving the lateral left second rib corresponds with an area of cortical thickening and sclerosis on the previous CT. This could represent old left second rib fracture or a sclerotic lesion. Small foci involving the right eighth rib, right seventh rib and possibly the right sixth rib could be related to prior fractures based on previous CT. At least 2 areas of increased uptake involving the left side of the scapula. Difficult to exclude small left scapular lesions. Activity in the left renal collecting system. Minimal activity in the right renal collecting system. Slightly increased uptake along the left side of the calvarium is indeterminate. Increased uptake in the lower pelvis is likely involving the urinary bladder. No suspicious uptake in the appendicular skeleton. Probable degenerative  changes in both shoulders. Focus of photopenia in the left chest compatible with patient's AICD. Slightly increased uptake in the sacrum is likely related to known sacral fractures. Photopenia in the right hip compatible with a right hip replacement. IMPRESSION: 1. Scattered areas of increased uptake involving multiple thoracic vertebral bodies. Areas of increased uptake involving T1, T5 and T7 compatible with known compression fractures. Based on the CT from 11/15/2023, there is concern for a lytic lesion  involving the T1 left pedicle. Concern for metastatic disease at T1. Cannot exclude metastatic disease at T2, T3, T5 and T7. 2. Small foci in the left scapula and cannot exclude metastatic bone lesions in this area. 3. Bilateral rib foci. These could be related to old rib fractures. However, the left second rib is suspicious for metastatic disease based on the prior CT. Electronically Signed   By: Richarda Overlie M.D.   On: 11/19/2023 19:09   CT PELVIS WO CONTRAST Result Date: 11/17/2023 CLINICAL DATA:  Bladder cancer to look for metastasis. EXAM: CT PELVIS WITHOUT CONTRAST TECHNIQUE: Multidetector CT imaging of the pelvis was performed following the standard protocol without intravenous contrast. RADIATION DOSE REDUCTION: This exam was performed according to the departmental dose-optimization program which includes automated exposure control, adjustment of the mA and/or kV according to patient size and/or use of iterative reconstruction technique. COMPARISON:  CT abdomen and pelvis 10/25/2020 FINDINGS: Urinary Tract: Foley catheter decompresses the bladder. Distal left ureter appears dilated, possibly reflux or obstruction. No stones are demonstrated. Bowel: Visualized small and large bowel are not abnormally distended. Prominent diverticulosis of the sigmoid colon. No definitive inflammatory changes to suggest acute diverticulitis. Vascular/Lymphatic: Mild calcification in the iliac arteries. No significant lymphadenopathy. Reproductive:  Uterus and ovaries are not enlarged. Other: Nonspecific soft tissue thickening in the presacral perirectal fat. This could represent a hematoma in the setting of sacral fractures. See below. Alternatively, this could represent postradiation or postsurgical change if there has been surgery or radiation to this area. Alternatively, infectious, inflammatory, or neoplastic etiologies could be considered. No discrete mass or fluid collection. Musculoskeletal: Postoperative right hip  arthroplasty. Degenerative changes in the lower lumbar spine. Nondisplaced fracture of the sacrum at the level of S2. This may represent a stress fracture or insufficiency fracture. Old appearing fracture deformity of the left inferior pubic ramus. IMPRESSION: 1. Foley catheter decompresses the bladder, limiting evaluation. 2. No lymphadenopathy or soft tissue mass identified. 3. Colonic diverticulosis without evidence of acute diverticulitis. 4. Nonspecific soft tissue thickening in the presacral space. Possibly etiologies include infectious, inflammatory, postoperative/postradiation, or posttraumatic change. 5. Nondisplaced fractures of the sacrum at the level of S2, possibly stress or insufficiency fracture. No destructive bone lesions are identified. Electronically Signed   By: Burman Nieves M.D.   On: 11/17/2023 20:23   IR NEPHROSTOMY PLACEMENT RIGHT Result Date: 11/16/2023 INDICATION: 69 year old female with vaginal/bladder mass and right distal ureteral obstruction. Cystoscopy in attempted ureteral stent placement was unsuccessful. Therefore, patient requires placement of a percutaneous nephrostomy tube. EXAM: IR NEPHROSTOMY PLACEMENT RIGHT COMPARISON:  None Available. MEDICATIONS: No additional antibiotics administered. ANESTHESIA/SEDATION: Fentanyl 2 mcg IV; Versed 100 mg IV administered by the radiology nurse Moderate Sedation Time:  10 minutes The patient's vital signs and level of consciousness were continuously monitored during the procedure by the interventional radiology nurse under my direct supervision. CONTRAST:  15mL OMNIPAQUE IOHEXOL 300 MG/ML SOLN - administered into the collecting system(s) FLUOROSCOPY: Radiation exposure index: 2 mGy reference air kerma COMPLICATIONS: None immediate. TECHNIQUE: The procedure,  risks, benefits, and alternatives were explained to the patient. Questions regarding the procedure were encouraged and answered. The patient understands and consents to the  procedure. The right flank was prepped with chlorhexidine in a sterile fashion, and a sterile drape was applied covering the operative field. A sterile gown and sterile gloves were used for the procedure. Local anesthesia was provided with 1% Lidocaine. The right flank was interrogated with ultrasound and the left kidney identified. The kidney is hydronephrotic. A suitable access site on the skin overlying the lower pole, posterior calix was identified. After local mg anesthesia was achieved, a small skin nick was made with an 11 blade scalpel. A 21 gauge Accustick needle was then advanced under direct sonographic guidance into the lower pole of the right kidney. A 0.018 inch wire was advanced under fluoroscopic guidance into the left renal collecting system. The Accustick sheath was then advanced over the wire and a 0.018 system exchanged for a 0.035 system. Gentle hand injection of contrast material confirms placement of the sheath within the renal collecting system. There is severe hydronephrosis. The tract from the scan into the renal collecting system was then dilated serially to 10-French. A 10-French Cook all-purpose drain was then placed and positioned under fluoroscopic guidance. The locking loop is well formed within the left renal pelvis. The catheter was secured to the skin with 2-0 Prolene and a sterile bandage was placed. Catheter was left to gravity bag drainage. IMPRESSION: Successful placement of a right 10 French percutaneous nephrostomy tube. Electronically Signed   By: Malachy Moan M.D.   On: 11/16/2023 15:43   DG C-Arm 1-60 Min-No Report Result Date: 11/16/2023 Fluoroscopy was utilized by the requesting physician.  No radiographic interpretation.   DG C-Arm 1-60 Min-No Report Result Date: 11/16/2023 Fluoroscopy was utilized by the requesting physician.  No radiographic interpretation.   CT THORACIC SPINE WO CONTRAST Result Date: 11/15/2023 CLINICAL DATA:  Back pain. Lower extremity  weakness. Question cauda acquired syndrome. EXAM: CT MYELOGRAPHY LUMBAR SPINE TECHNIQUE: CT imaging of the thoracic and lumbar spine was performed after Isovue 300M contrast administration. Multiplanar CT image reconstructions were also generated. RADIATION DOSE REDUCTION: This exam was performed according to the departmental dose-optimization program which includes automated exposure control, adjustment of the mA and/or kV according to patient size and/or use of iterative reconstruction technique. COMPARISON:  CT of the thoracic and lumbar spine 11/14/2023. FINDINGS: Thoracic spine Alignment: No significant listhesis is present. Mild rightward curvature is centered at T6. Mild straightening of the normal thoracic kyphosis is stable. Vertebrae: The superior endplate compression fracture at L1 is more severe on the left. Slight retropulsed bone is present without focal stenosis. The superior endplate fracture at T5 demonstrates 30-40% loss of height. No retropulsed bone is present. This may be subacute. Superior endplate fracture at T7 is more acute, with 40-50% loss of height. Slight retropulsed bone is present without significant stenosis. Progressive collapse is present at this level. Sclerotic lesions are present on the left at T2 and T3 extending into the left pedicle at T3. Conus medullaris: Extends to the  level and appears normal. Paraspinal and other soft tissues: The paraspinous soft tissues are within normal limits. The visualized lung fields are clear. Pacing wires are in place. Severe right-sided hydronephrosis is again noted. Disc levels: No significant central canal stenosis or cord compression is present. Mild right foraminal narrowing present at T8-9, T9-10 and T10-11. Mild left foraminal narrowing present at T5-6 and T6-7. Lumbar spine Segmentation: 5 non  rib-bearing lumbar type vertebral bodies are present. The lowest fully formed vertebral body is L5. Alignment: No significant listhesis is  present. Levoconvex curvature is centered at L1-2. Mild straightening of the normal lumbar lordosis is present. Vertebrae: Vertebral body heights are normal. No acute or healing fractures are present. Conus medullaris: Extends to the T12-L1   Level and appears normal. Paraspinal and other soft tissues: Atherosclerotic calcifications are present the aorta and branch vessels. No adenopathy is present. Severe right-sided hydronephrosis is again noted. Disc levels: L1-2: Normal disc signal and height is present. No focal protrusion or stenosis is present. L2-3: Normal disc signal and height is present. No focal protrusion or stenosis is present. L3-4: A mild broad-based disc bulge is present. Mild facet hypertrophy is noted bilaterally. Disc material extends into the foramina bilaterally. Mild right foraminal narrowing is present. L4-5: A leftward disc protrusion is present. Moderate left subarticular and foraminal narrowing is present. Mild right foraminal narrowing is present. L5-S1: Chronic loss of disc height is present. A leftward disc protrusion likely contacts the traversing left S1 nerve roots. Facet spurring contributes to mild foraminal narrowing bilaterally, left greater than right. IMPRESSION: 1. No significant cord compression or central canal stenosis to explain the patient's symptoms. 2. Acute superior endplate compression fracture at T7 with 40-50% loss of height. Slight retropulsed bone is present without significant stenosis. 3. Superior endplate compression fracture at T5 with 30-40% loss of height. No retropulsed bone is present. This may be subacute. 4. Superior endplate compression fracture at L1 is more severe on the left. Slight retropulsed bone is present without focal stenosis. 5. Sclerotic lesions on the left at T2 and T3 extending into the left pedicle at T3. These are concerning for metastatic disease. 6. Mild right foraminal narrowing at T8-9, T9-10 and T10-11. 7. Mild left foraminal  narrowing at T5-6 and T6-7. 8. Moderate left subarticular and foraminal narrowing at L4-5. 9. Mild foraminal narrowing bilaterally at L5-S1, left greater than right. 10. Severe right-sided hydronephrosis is again noted. 11.  Aortic Atherosclerosis (ICD10-I70.0). These results were called by telephone at the time of interpretation on 11/15/2023 at 1:50 pm to Dr. Coletta Memos, Who verbally acknowledged these results. Electronically Signed   By: Marin Roberts M.D.   On: 11/15/2023 15:31   CT LUMBAR SPINE WO CONTRAST Result Date: 11/15/2023 CLINICAL DATA:  Back pain. Lower extremity weakness. Question cauda acquired syndrome. EXAM: CT MYELOGRAPHY LUMBAR SPINE TECHNIQUE: CT imaging of the thoracic and lumbar spine was performed after Isovue 300M contrast administration. Multiplanar CT image reconstructions were also generated. RADIATION DOSE REDUCTION: This exam was performed according to the departmental dose-optimization program which includes automated exposure control, adjustment of the mA and/or kV according to patient size and/or use of iterative reconstruction technique. COMPARISON:  CT of the thoracic and lumbar spine 11/14/2023. FINDINGS: Thoracic spine Alignment: No significant listhesis is present. Mild rightward curvature is centered at T6. Mild straightening of the normal thoracic kyphosis is stable. Vertebrae: The superior endplate compression fracture at L1 is more severe on the left. Slight retropulsed bone is present without focal stenosis. The superior endplate fracture at T5 demonstrates 30-40% loss of height. No retropulsed bone is present. This may be subacute. Superior endplate fracture at T7 is more acute, with 40-50% loss of height. Slight retropulsed bone is present without significant stenosis. Progressive collapse is present at this level. Sclerotic lesions are present on the left at T2 and T3 extending into the left pedicle at T3. Conus medullaris: Extends  to the  level and appears  normal. Paraspinal and other soft tissues: The paraspinous soft tissues are within normal limits. The visualized lung fields are clear. Pacing wires are in place. Severe right-sided hydronephrosis is again noted. Disc levels: No significant central canal stenosis or cord compression is present. Mild right foraminal narrowing present at T8-9, T9-10 and T10-11. Mild left foraminal narrowing present at T5-6 and T6-7. Lumbar spine Segmentation: 5 non rib-bearing lumbar type vertebral bodies are present. The lowest fully formed vertebral body is L5. Alignment: No significant listhesis is present. Levoconvex curvature is centered at L1-2. Mild straightening of the normal lumbar lordosis is present. Vertebrae: Vertebral body heights are normal. No acute or healing fractures are present. Conus medullaris: Extends to the T12-L1   Level and appears normal. Paraspinal and other soft tissues: Atherosclerotic calcifications are present the aorta and branch vessels. No adenopathy is present. Severe right-sided hydronephrosis is again noted. Disc levels: L1-2: Normal disc signal and height is present. No focal protrusion or stenosis is present. L2-3: Normal disc signal and height is present. No focal protrusion or stenosis is present. L3-4: A mild broad-based disc bulge is present. Mild facet hypertrophy is noted bilaterally. Disc material extends into the foramina bilaterally. Mild right foraminal narrowing is present. L4-5: A leftward disc protrusion is present. Moderate left subarticular and foraminal narrowing is present. Mild right foraminal narrowing is present. L5-S1: Chronic loss of disc height is present. A leftward disc protrusion likely contacts the traversing left S1 nerve roots. Facet spurring contributes to mild foraminal narrowing bilaterally, left greater than right. IMPRESSION: 1. No significant cord compression or central canal stenosis to explain the patient's symptoms. 2. Acute superior endplate compression  fracture at T7 with 40-50% loss of height. Slight retropulsed bone is present without significant stenosis. 3. Superior endplate compression fracture at T5 with 30-40% loss of height. No retropulsed bone is present. This may be subacute. 4. Superior endplate compression fracture at L1 is more severe on the left. Slight retropulsed bone is present without focal stenosis. 5. Sclerotic lesions on the left at T2 and T3 extending into the left pedicle at T3. These are concerning for metastatic disease. 6. Mild right foraminal narrowing at T8-9, T9-10 and T10-11. 7. Mild left foraminal narrowing at T5-6 and T6-7. 8. Moderate left subarticular and foraminal narrowing at L4-5. 9. Mild foraminal narrowing bilaterally at L5-S1, left greater than right. 10. Severe right-sided hydronephrosis is again noted. 11.  Aortic Atherosclerosis (ICD10-I70.0). These results were called by telephone at the time of interpretation on 11/15/2023 at 1:50 pm to Dr. Coletta Memos, Who verbally acknowledged these results. Electronically Signed   By: Marin Roberts M.D.   On: 11/15/2023 15:31   DG MYELOGRAPHY LUMBAR INJ MULTI REGION Result Date: 11/15/2023 CLINICAL DATA:  68 year old female with history of back pain, lower extremity weakness. Contrast injection for CT myelogram requested. EXAM: LUMBAR MYELOGRAM FLUOROSCOPY: dictate in minutes and seconds PROCEDURE: After thorough discussion of risks and benefits of the procedure including bleeding, infection, injury to nerves, blood vessels, adjacent structures as well as headache and CSF leak, written and oral informed consent was obtained. Time out form was completed. Patient was positioned prone on the fluoroscopy table. Local anesthesia was provided with 1% lidocaine without epinephrine after prepped and draped in the usual sterile fashion. Puncture was performed at L2-L3 using a 3 1/2 inch 20-gauge spinal needle via left approach. Using a single pass through the dura, the needle was  placed within the  thecal sac, with return of clear CSF. 15 mL of Omnipaque 180 was injected into the thecal sac, with normal opacification of the nerve roots and cauda equina consistent with free flow within the subarachnoid space. FINDINGS: Contiguous axial images were obtained through the Lumbar spine after the intrathecal infusion with appropriate contrast dispersal. IMPRESSION: Successful intrathecal administration of contrast for CT myelogram. Performed by: Loyce Dys PA-C Supervised by Dr. Chryl Heck, MD Electronically Signed   By: Malachy Moan M.D.   On: 11/15/2023 14:34   DG Chest 2 View Result Date: 11/06/2023 CLINICAL DATA:  Chest pain.  COPD. EXAM: CHEST - 2 VIEW COMPARISON:  October 10, 2023. FINDINGS: Stable cardiomediastinal silhouette. Left-sided defibrillator is unchanged. Both lungs are clear. The visualized skeletal structures are unremarkable. IMPRESSION: No active cardiopulmonary disease. Electronically Signed   By: Lupita Raider M.D.   On: 11/06/2023 17:09

## 2023-11-20 NOTE — Progress Notes (Signed)
 Physical Therapy Treatment Patient Details Name: [name] MRN: [id number] DOB: 1956/07/27 Today's Date: 11/20/2023   History of Present Illness 68 year old female who presented to the emergency department with complaint of back pain radiating to her hips, inability to ambulate, weakness of bilateral lower extremity, MRI could not be done because of presence of pacemaker which was incompatible. CT myelogram done. CT of the thoracolumbar spine showed redemonstration of scattered sclerotic axial skeleton lesions suspicious for mets.Also showed L3-L4, L4-L5, L5-S1 disc bulge with question at least moderate central canal stenosis at the L4-L5 level.     Neurology, neurosurgery, neurosurgery, oncology, radiation oncology and urology consulted. Back pain/lower extremity weakness suspected to be from metastatic bladder cancer. Urology perfomred cystoscopy- TURBT 11/16/23 at [facility].         PMH: COPD, bladder cancer, spinal stenosis, chronic knee pain, sHF, HTN, MDD, PTSD, incisional hernia repair    PT Comments  Pt is progressing toward acute PT goals this session with progression to OOB mobility. Pt performed bed mobility with MOD A and able to perform sit to stand and step pivot transfer to recliner with Min A+2. Pt with improved sensation and strength of B LEs this date- still incontinent of bowel/bladder which pt reports is not her baseline. Pt will benefit from continued skilled PT services to increase her independence and maximize safety with mobility. Continue to recommend  continued  follow up therapy, <3 hours/day upon d/c.       If plan is discharge home, recommend the following: Two people to help with walking and/or transfers;Two people to help with bathing/dressing/bathroom;Help with stairs or ramp for entrance;Assistance with cooking/housework   Can travel by private vehicle     No  Equipment Recommendations  None recommended by PT (per eval note, pt owns RW)    Recommendations for  Other Services       Precautions / Restrictions Precautions Precautions: Fall Precaution/Restrictions Comments: spinal for comfort with history Restrictions Weight Bearing Restrictions Per Provider Order: No     Mobility  Bed Mobility Overal bed mobility: Needs Assistance Bed Mobility: Rolling, Sidelying to Sit Rolling: Min assist, Used rails Sidelying to sit: Mod assist, HOB elevated, Used rails       General bed mobility comments: cues for log roll techique for comfort, assist to bring trunk to upright. Able to scoot hips to EOB incrementally with rest breaks due to pain and lightheadeness- resolved with seated rest    Transfers Overall transfer level: Needs assistance Equipment used: 2 person hand held assist, Rolling walker (2 wheels) Transfers: Sit to/from Stand, Bed to chair/wheelchair/BSC Sit to Stand: Min assist, +2 safety/equipment, +2 physical assistance   Step pivot transfers: Min assist, +2 physical assistance, +2 safety/equipment       General transfer comment: STS x2; initially with B HHA and knee blcok for safety, no knee buckling noted and pt able to take side steps to R with facilitation for weight shifts. Noted increased difficulty with progression of R LE compared to L. Pt able to stand and take side steps over to chair on 2nd stand attempt and scoot hips posteriorly in chair once seated. Pt incontinent of bowel able to stand for prolonged period for assistance with pericare. Pt reporting pain in LB and LEs with mobility- therapeutic rest breaks provided as needed.    Ambulation/Gait                   Stairs  Wheelchair Mobility     Tilt Bed    Modified Rankin (Stroke Patients Only)       Balance Overall balance assessment: Needs assistance Sitting-balance support: Bilateral upper extremity supported, Feet unsupported, Feet supported Sitting balance-Leahy Scale: Fair     Standing balance support: Bilateral upper  extremity supported, During functional activity, Reliant on assistive device for balance Standing balance-Leahy Scale: Poor Standing balance comment: reliant on external support                            Communication Communication Communication: No apparent difficulties  Cognition Arousal: Alert Behavior During Therapy: WFL for tasks assessed/performed   PT - Cognitive impairments: No apparent impairments                         Following commands: Intact      Cueing    Exercises      General Comments General comments (skin integrity, edema, etc.): Pt with improved sensation to light touch assessed up to medial thigh, able to perform AROM DF/PF, knee flexion with increased time in supine and seated knee extension and hip flexion with increased difficulty. R LE weaker compared to L.      Pertinent Vitals/Pain Pain Assessment Pain Assessment: 0-10 Pain Score: 9  Pain Location: Lower back and LEs Pain Descriptors / Indicators: Aching, Radiating Pain Intervention(s): Limited activity within patient's tolerance, Monitored during session, Repositioned    Home Living                          Prior Function            PT Goals (current goals can now be found in the care plan section) Acute Rehab PT Goals Patient Stated Goal: Get back to independence and have less pain PT Goal Formulation: With patient Time For Goal Achievement: 12/01/23 Potential to Achieve Goals: Good Progress towards PT goals: Progressing toward goals    Frequency    Min 2X/week      PT Plan      Co-evaluation              AM-PAC PT "6 Clicks" Mobility   Outcome Measure  Help needed turning from your back to your side while in a flat bed without using bedrails?: A Little Help needed moving from lying on your back to sitting on the side of a flat bed without using bedrails?: A Lot Help needed moving to and from a bed to a chair (including a wheelchair)?:  Total Help needed standing up from a chair using your arms (e.g., wheelchair or bedside chair)?: Total Help needed to walk in hospital room?: Total Help needed climbing 3-5 steps with a railing? : Total 6 Click Score: 9    End of Session Equipment Utilized During Treatment: Gait belt Activity Tolerance: Patient tolerated treatment well Patient left: in chair;with call bell/phone within reach;with chair alarm set Nurse Communication: Mobility status;Other (comment) (use of +2 with RW for back to bed.) PT Visit Diagnosis: Other abnormalities of gait and mobility (R26.89);Muscle weakness (generalized) (M62.81)     Time: 5366-4403 PT Time Calculation (min) (ACUTE ONLY): 27 min  Charges:    $Therapeutic Activity: 23-37 mins PT General Charges $$ ACUTE PT VISIT: 1 Visit                    Lyman Speller., PT, DPT  Acute Rehabilitation Services  Office [phone]   11/20/2023, 12:54 PM

## 2023-11-20 NOTE — Progress Notes (Signed)
 PROGRESS NOTE    [name]  BJY:[id number] DOB: Apr 04, 1956 DOA: 11/14/2023 PCP: [name], FNP  Chief Complaint  Patient presents with   Flank Pain    Brief Narrative:   68 year old female with history of COPD, bladder cancer, spinal stenosis who presented to the emergency department with complaint of back pain radiating to her hips, inability to ambulate, weakness of bilateral lower extremity, lack of bowel movement. Hemodynamically stable on presentation. MRI could not be done because of presence of pacemaker which was incompatible. CT myelogram done. Neurology, neurosurgery, neurosurgery, oncology, radiation oncology consulted. Back pain/lower extremity weakness suspected to be from metastatic bladder cancer. Patient underwent cystoscopy with TURBT at [facility].   Assessment & Plan:   Principal Problem:   Paraplegia (HCC) Active Problems:   AKI (acute kidney injury) (HCC)   Urothelial carcinoma of bladder (HCC)   Bone lesion  Bilateral Lower Extremity Weakness  Back Pain  Thoracic and Lumbar Compression Fractures Notes bilateral LE weakness (she relates this maybe due to pain?) Neurology suspected related to gabapentin toxicity (also noted significant back pain on straight leg raise) (recommended discontinuation of gabapentin and not resuming outpatient) (see 3/22 note from Dr. Otelia Limes) NSGY notes myelogram did not show compressive lesion other than L displaced disc at L4/5.  Low suspicion for compressive lesion leading to symptoms.  (See 3/22 note from Dr. Franky Macho) CT T spine without acute displaced fracture or traumatic listhesis of the thoracic spine.  Scattered sclerotic axial skeletal lesions, ? Mets. CT L spine without acute displaced fracture or traumatic listhesis of the lumbar spine.  L3-L4, L4-L5, L5-S1 with question at least moderate central canal stenosis at the L4-L5 level.  Acute minimally displaced R posterior first rib fracture.  Severe R  hydrourteronephrosis.   Repeat CT T/L spine (with myelogram) without significant cord compression or central canal stenosis to explain symptoms.  Acute superior endplate compression fracture at T7 with 40-50% loss of height, slightly retropulsed bone without significant stenosis.  Superior endplate compression fracture at T5 with 30-40% loss of height.  Superior endplate compression fracture at L1 more severe on the left.  Retropulsed bone present without focal stenosis.  Sclerotic lesions on the L at T2 and T3 extending into the L pedicle at T3 (concerning for metastatic disease).  Mild R foraminal narrowing at T8-9, T9-10, and T10-11.  Mild L foraminal narrowing at T5-6, and T6-7.  Moderate L subarticular and foraminal narrowing at L4-5.  Mild foraminal narrowing bilaterally at L5-S1, L>R.  Unfortunately, pacemaker is not MRI compatible (reviewed with EP) Per rad onc, discussion at tumor board, "patient's current symptoms unlikely related to spinal cord compression from metastatic disease" no clear role for radiation therapy Follow on IV decadron Reportedly with some gradual improvement  Bone Lesions Unclear etiology, oncology recommending tissue from kyphoplasty if able for diagnosis IR consult placed Bone scan done 3/26 -> concern for metastatic disease at T1, can't exclude metasatic disease at T2, T3, T5, and T7.  Can't exclude metastatic bone lesions in L scapula.  L second rib is suspicious for metastatic disease.  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  Bladder Mass S/p cystoscopy with TURBT on 3/23 Path notable for high grade urothelial carcinoma showing squamous differentiation  S/p R nephrostomy tube 3/23 Appreciate oncology assistance  AKI on CKD IIIb R severe hydroureteronephrosis S/p R PCN by IR on 3/23  Chronic Diastolic Heart Failure Lasix  Chronic Pain Oxycodone, dilaudid prn COPD Not wheezing  Leukocytosis  Monitor    DVT  prophylaxis: SCD Code Status: full Family Communication: none Disposition:   Status is: Inpatient Remains inpatient appropriate because: pending inpatient workup   Consultants:  Oncology NSGY IR Radiation oncology Gyn onc urology  Procedures:   3/23 Procedure(s): 1.  Cystoscopy with TURBT large  3/23 IMPRESSION: Successful placement of Todd Argabright right 10 French percutaneous nephrostomy tube.  Antimicrobials:  Anti-infectives (From admission, onward)    Start     Dose/Rate Route Frequency Ordered Stop   11/16/23 1330  cefTRIAXone (ROCEPHIN) 2 g in sodium chloride 0.9 % 100 mL IVPB       Note to Pharmacy: Give in IR for surgical prophylaxis   2 g 200 mL/hr over 30 Minutes Intravenous On call 11/16/23 1322 11/17/23 1330   11/16/23 1003  ceFAZolin (ANCEF) 2-4 GM/100ML-% IVPB       Note to Pharmacy: Rodena Goldmann nan: cabinet override      11/16/23 1003 11/16/23 2214   11/14/23 2030  cefTRIAXone (ROCEPHIN) 1 g in sodium chloride 0.9 % 100 mL IVPB        1 g 200 mL/hr over 30 Minutes Intravenous Every 24 hours 11/14/23 2027 11/17/23 0745       Subjective: C/o continued weakness due to pain Sudden onset last week  Objective: Vitals:   11/19/23 1310 11/19/23 2027 11/20/23 0435 11/20/23 1351  BP: 114/63 123/70 120/79 125/69  Pulse: 80 80 70 86  Resp: 16 14 16 18   Temp: 99 F (37.2 C) 98.3 F (36.8 C) 98.2 F (36.8 C) 98.2 F (36.8 C)  TempSrc:  Oral Oral Oral  SpO2: 99% 99% 99% 98%  Weight:      Height:        Intake/Output Summary (Last 24 hours) at 11/20/2023 1440 Last data filed at 11/20/2023 1352 Gross per 24 hour  Intake 365 ml  Output 2250 ml  Net -1885 ml   Filed Weights   11/14/23 1415 11/16/23 1231  Weight: 67 kg 73.2 kg    Examination:  General exam: Appears calm and comfortable  Respiratory system: unlabored Cardiovascular system: RRR Gastrointestinal system: Abdomen is nondistended, soft and nontender.  Central nervous system: symmetric,  bilateral LE weakness 3/5 - intact sensation to light touch Extremities: no LEE    Data Reviewed: I have personally reviewed following labs and imaging studies  CBC: Recent Labs  Lab 11/14/23 1649 11/15/23 0320 11/17/23 0350 11/19/23 0413  WBC 12.4* 11.8* 14.7* 10.7*  NEUTROABS 10.6*  --   --  8.0*  HGB 13.2 11.5* 10.5* 10.2*  HCT 41.9 35.7* 33.5* 33.7*  MCV 91.9 90.4 92.8 98.0  PLT 361 331 320 287    Basic Metabolic Panel: Recent Labs  Lab 11/16/23 0624 11/17/23 0350 11/18/23 1331 11/19/23 0413 11/20/23 0413  NA 141 140 140 140 137  K 4.5 4.5 4.4 4.6 4.3  CL 111 112* 112* 114* 110  CO2 17* 19* 20* 19* 18*  GLUCOSE 89 96 90 124* 115*  BUN 61* 54* 45* 38* 26*  CREATININE 1.75* 1.80* 1.83* 1.57* 1.31*  CALCIUM 8.4* 8.4* 8.5* 8.4* 8.5*    GFR: Estimated Creatinine Clearance: 34.4 mL/min (Devell Parkerson) (by C-G formula based on SCr of 1.31 mg/dL (H)).  Liver Function Tests: Recent Labs  Lab 11/14/23 1649  AST 24  ALT 15  ALKPHOS 72  BILITOT 0.5  PROT 7.6  ALBUMIN 3.6    CBG: No results for input(s): "GLUCAP" in the last 168 hours.   No results found for  this or any previous visit (from the past 240 hours).       Radiology Studies: NM Bone Scan Whole Body Result Date: 11/19/2023 CLINICAL DATA:  Bladder cancer. Severe low back pain that radiates into the bilateral legs. EXAM: NUCLEAR MEDICINE WHOLE BODY BONE SCAN TECHNIQUE: Whole body anterior and posterior images were obtained approximately 3 hours after intravenous injection of radiopharmaceutical. RADIOPHARMACEUTICALS:  19.6 mCi Technetium-91m MDP IV COMPARISON:  CT chest 10/17/2023 and CT abdomen pelvis 10/28/2023 and CT spine 11/15/2023 FINDINGS: Foci of increased uptake involving the T5 and T7 vertebral bodies are compatible with the known compression fractures. Subtle areas of increased uptake along the left side of T2 and T3 may correspond with the areas of sclerosis on the previous cross-sectional imaging.  Increased uptake near T1 corresponds with Miriam Liles compression fracture based on the CT from 11/15/2023. Focus of increased uptake involving the lateral left second rib corresponds with an area of cortical thickening and sclerosis on the previous CT. This could represent old left second rib fracture or Tiah Heckel sclerotic lesion. Small foci involving the right eighth rib, right seventh rib and possibly the right sixth rib could be related to prior fractures based on previous CT. At least 2 areas of increased uptake involving the left side of the scapula. Difficult to exclude small left scapular lesions. Activity in the left renal collecting system. Minimal activity in the right renal collecting system. Slightly increased uptake along the left side of the calvarium is indeterminate. Increased uptake in the lower pelvis is likely involving the urinary bladder. No suspicious uptake in the appendicular skeleton. Probable degenerative changes in both shoulders. Focus of photopenia in the left chest compatible with patient's AICD. Slightly increased uptake in the sacrum is likely related to known sacral fractures. Photopenia in the right hip compatible with Oluwatobi Visser right hip replacement. IMPRESSION: 1. Scattered areas of increased uptake involving multiple thoracic vertebral bodies. Areas of increased uptake involving T1, T5 and T7 compatible with known compression fractures. Based on the CT from 11/15/2023, there is concern for Milton Sagona lytic lesion involving the T1 left pedicle. Concern for metastatic disease at T1. Cannot exclude metastatic disease at T2, T3, T5 and T7. 2. Small foci in the left scapula and cannot exclude metastatic bone lesions in this area. 3. Bilateral rib foci. These could be related to old rib fractures. However, the left second rib is suspicious for metastatic disease based on the prior CT. Electronically Signed   By: Richarda Overlie M.D.   On: 11/19/2023 19:09        Scheduled Meds:  ARIPiprazole  5 mg Oral Daily    Buprenorphine HCl  900 mcg Buccal Q12H   Chlorhexidine Gluconate Cloth  6 each Topical Daily   dexamethasone (DECADRON) injection  6 mg Intravenous Q24H   fluticasone furoate-vilanterol  1 puff Inhalation Daily   furosemide  40 mg Oral Daily   lidocaine  1 patch Transdermal Q24H   pantoprazole  40 mg Oral Daily   polyethylene glycol  17 g Oral Daily   ranolazine  500 mg Oral BID   rosuvastatin  20 mg Oral Daily   senna-docusate  2 tablet Oral BID   sertraline  50 mg Oral Daily   sodium bicarbonate  650 mg Oral BID   sodium chloride flush  3 mL Intravenous Q12H   sodium chloride flush  3-10 mL Intravenous Q12H   Continuous Infusions:   LOS: 6 days    Time spent: over 30 min  Lacretia Nicks, MD [facility]   To contact the attending provider between 7A-7P or the covering provider during after hours 7P-7A, please log into the web site [email or web address] and access using universal [place] password for that web site. If you do not have the password, please call the hospital operator.  11/20/2023, 2:40 PM

## 2023-11-20 NOTE — Plan of Care (Signed)
  Problem: Education: Goal: Knowledge of General Education information will improve Description: Including pain rating scale, medication(s)/side effects and non-pharmacologic comfort measures Outcome: Completed/Met   Problem: Clinical Measurements: Goal: Ability to maintain clinical measurements within normal limits will improve Outcome: Completed/Met Goal: Will remain free from infection Outcome: Completed/Met Goal: Diagnostic test results will improve Outcome: Completed/Met Goal: Respiratory complications will improve Outcome: Completed/Met Goal: Cardiovascular complication will be avoided Outcome: Completed/Met

## 2023-11-20 NOTE — Progress Notes (Cosign Needed Addendum)
 Referring Physician(s): Winter,C/ Industrial/product designer Physician: [facility]  Patient Status:  [facility] - In-pt  Chief Complaint: Low back pain, metastatic bladder cancer/hydronephrosis; s/p right nephrostomy 11/16/23   Subjective: Pt sitting up in chair; doing fairly well; still has some low back pain with rad to hips; denies thoracic spine pain, fever, HA, CP, dyspnea, cough, abd pain, N/V.    Past Medical History:  Diagnosis Date   Abdominal pain 10/17/2020   Abnormality of gait due to impairment of balance 11/22/2020   Acute diverticulitis 10/17/2020   Admission for long-term opiate analgesic use 10/24/2019   Arthritis of right hip 05/29/2016   Formatting of this note might be different from the original. Added automatically from request for surgery [id number]   BMI 26.0-26.9,adult 03/29/2020   Bronchial asthma    Cardiomyopathy (HCC)    Overview:  Ejection fraction 45% in 2015 Ejection fraction 30 to 35% in November 2018   Chronic back pain    Chronic constipation 07/31/2021   Chronic hypoxemic respiratory failure (HCC) 10/24/2019   Chronic narcotic use 03/23/2015   Chronic pain of both knees 12/21/2018   Added automatically from request for surgery [id number]  Formatting of this note might be different from the original. 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 06/19/2017   Dyslipidemia 05/31/2015   GERD (gastroesophageal reflux disease)    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HCC) 07/13/2020   Hypotension 05/17/2020   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   Ileus following gastrointestinal surgery (HCC) 03/26/2015   Major depressive disorder, single episode, moderate (HCC) 10/24/2019   Malnutrition of moderate degree (HCC) 10/20/2020   Mesenteric ischemia (HCC) 10/16/2020   Migraine without aura with status migrainosus 10/24/2019   Mixed incontinence 10/20/2020   Myocardial infarction (HCC)    Nausea & vomiting    Obstructive chronic bronchitis with exacerbation (HCC) 10/25/2019   Other spondylosis with radiculopathy, lumbar region 10/24/2019   Persistent vomiting 05/17/2020   Presence of left artificial hip joint 10/24/2019   PTSD (post-traumatic stress disorder)    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 03/23/2015 04/19/2014   Senile osteoporosis 10/24/2019   Sepsis (HCC) 10/17/2020   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 Small internal hemrrhoids. Otherwise normal colonoscopy   COLONOSCOPY WITH PROPOFOL N/A 09/27/2021   Procedure: COLONOSCOPY WITH PROPOFOL;  Surgeon: Napoleon Form, MD;  Location: [facility];  Service: Endoscopy;  Laterality: N/A;   CORONARY ANGIOPLASTY     ESOPHAGOGASTRODUODENOSCOPY  02/04/2014   Mild gastritis. Otherwise normal EGD   HAND SURGERY Bilateral    ICD GENERATOR CHANGEOUT N/A 05/21/2019    Procedure: ICD GENERATOR CHANGEOUT;  Surgeon: Regan Lemming, MD;  Location: [facility] INVASIVE CV LAB;  Service: Cardiovascular;  Laterality: N/A;   ICD IMPLANT     [employer]tr[employer]   intestinal blockage 2011     IR NEPHROSTOMY PLACEMENT RIGHT  11/16/2023   LAPAROSCOPIC ASSISTED VENTRAL HERNIA REPAIR N/A 03/23/2015   Procedure: LAPAROSCOPIC VENTRAL WALL HERNIA REPAIR;  Surgeon: Karie Soda, MD;  Location: [facility] ORS;  Service: General;  Laterality: N/A;  With MESH   LAPAROSCOPIC LYSIS OF ADHESIONS N/A 03/23/2015   Procedure: LAPAROSCOPIC LYSIS OF ADHESIONS;  Surgeon: Karie Soda, MD;  Location: [facility];  Service: General;  Laterality: N/A;   LSCS      x2   NASAL SEPTUM SURGERY     NECK SURGERY     fused   TONSILLECTOMY     TRANSURETHRAL RESECTION OF BLADDER TUMOR N/A 11/16/2023   Procedure: TURBT (TRANSURETHRAL RESECTION OF BLADDER TUMOR);  Surgeon: Rene Paci, MD;  Location: [facility];  Service: Urology;  Laterality: N/A;   TYMPANOSTOMY TUBE PLACEMENT Right 2024   ULNAR NERVE TRANSPOSITION  01/23/2012   Procedure: ULNAR NERVE DECOMPRESSION/TRANSPOSITION;  Surgeon: Cristi Loron, MD;  Location: [facility] NEURO ORS;  Service: Neurosurgery;  Laterality: Left;  LEFT ulnar nerve decompression      Allergies: Patient has no known allergies.  Medications: Prior to Admission medications   Medication Sig Start Date End Date Taking? Authorizing Provider  ARIPiprazole (ABILIFY) 5 MG tablet TAKE 1 TABLET BY MOUTH DAILY 10/07/23  Yes Cox, Kirsten, MD  aspirin 81 MG tablet Take 81 mg by mouth daily.   Yes [provider]  BREO ELLIPTA 100-25 MCG/ACT AEPB INHALE 1 PUFF INTO THE LUNGS DAILY. Patient taking differently: Inhale 1 puff into the lungs daily as needed. 06/03/23  Yes Craft, Huston Foley, PA  Buprenorphine HCl (BELBUCA) 900 MCG FILM Place 900 mcg inside cheek every 12 (twelve) hours.   Yes [provider]  carvedilol (COREG) 3.125 MG tablet TAKE ONE (1) TABLET BY MOUTH TWICE  DAILY WITH MEALS Patient taking differently: Take 3.125 mg by mouth 2 (two) times daily with a meal. 06/02/23  Yes Cox, Kirsten, MD  cefdinir (OMNICEF) 300 MG capsule Take 300 mg by mouth 2 (two) times daily. 11/10/23  Yes [provider]  denosumab (PROLIA) 60 MG/ML SOSY injection Inject 60 mg into the skin every 6 (six) months. 02/14/20  Yes Abigail Miyamoto, MD  diclofenac Sodium (VOLTAREN) 1 % GEL Apply 2 g topically 4 (four) times daily. Patient taking differently: Apply 2 g topically as needed (Pain). 05/01/21  Yes Abigail Miyamoto, MD  EPINEPHRINE 0.3 mg/0.3 mL IJ SOAJ injection Inject 0.3 mLs (0.3 mg total) into the muscle as needed for anaphylaxis. 04/27/20  Yes Abigail Miyamoto, MD  esomeprazole (NEXIUM) 20 MG capsule TAKE ONE (1) CAPSULE BY MOUTH ONCE DAILY 08/28/23  Yes Cox, Kirsten, MD  FARXIGA 5 MG TABS tablet Take 5 mg by mouth daily. 10/10/23  Yes [provider]  fluticasone (FLONASE) 50 MCG/ACT nasal spray USE 2 SPRAYS IN EACH NOSTRILS DAILY AS NEEDED Patient taking differently: Place 2 sprays into both nostrils daily as needed for allergies or rhinitis. 07/18/23  Yes [name], FNP  furosemide (LASIX) 40 MG tablet Take 1 tablet (40 mg  total) by mouth daily. 06/25/23 11/14/23 Yes Georgeanna Lea, MD  gabapentin (NEURONTIN) 800 MG tablet Take 1 tablet (800 mg total) by mouth 3 (three) times daily. Patient usually takes twice a day 01/21/20  Yes Abigail Miyamoto, MD  Galcanezumab-gnlm [facility]) 120 MG/ML SOSY Inject 120 mg into the skin every 30 (thirty) days. 01/06/23  Yes Cox, Kirsten, MD  ipratropium-albuterol (DUONEB) 0.5-2.5 (3) MG/3ML SOLN USE 1 VIAL VIA NEBULIZER EVERY 6 HOURS AS NEEDED FOR SHORTNESS OF BREATH 10/16/23  Yes [name], FNP  levocetirizine (XYZAL) 5 MG tablet Take 5 mg by mouth daily as needed for allergies. 12/13/19  Yes [provider]  lubiprostone (AMITIZA) 24 MCG capsule TAKE 1 CAPSULE(24 MCG) BY MOUTH TWICE  DAILY WITH A MEAL 09/15/23  Yes Cox, Kirsten, MD  megestrol (MEGACE) 20 MG tablet Take 1 tablet (20 mg total) by mouth daily. 06/19/23  Yes Cox, Kirsten, MD  Multiple Vitamin (MULTIVITAMIN WITH MINERALS) TABS tablet Take 1 tablet by mouth daily. 10/30/20  Yes Rodolph Bong, MD  naloxone [facility]) nasal spray 4 mg/0.1 mL Place 1 spray into the nose once. 01/12/21  Yes [provider]  nitrofurantoin, macrocrystal-monohydrate, (MACROBID) 100 MG capsule Take 100 mg by mouth 2 (two) times daily. 11/10/23  Yes [provider]  nitroGLYCERIN (NITROSTAT) 0.4 MG SL tablet Place 1 tablet (0.4 mg total) under the tongue every 5 (five) minutes as needed for chest pain. 11/27/21  Yes Georgeanna Lea, MD  oxyCODONE (ROXICODONE) 15 MG immediate release tablet Take 15 mg by mouth every 8 (eight) hours as needed for pain. 08/29/21  Yes [provider]  potassium chloride (KLOR-CON) 10 MEQ tablet Take 3 tablets (30 mEq total) by mouth daily for 7 days. 10/29/23 11/14/23 Yes Camnitz, Will Daphine Deutscher, MD  ranolazine (RANEXA) 500 MG 12 hr tablet TAKE 1 TABLET BY MOUTH TWICE DAILY 10/28/23  Yes Georgeanna Lea, MD  rosuvastatin (CRESTOR) 20 MG tablet TAKE 1 TABLET BY MOUTH ONCE DAILY 08/28/23  Yes Cox, Kirsten, MD  sacubitril-valsartan (ENTRESTO) 24-26 MG TAKE ONE (1) TABLET BY MOUTH TWICE DAILY Patient taking differently: Take 1 tablet by mouth 2 (two) times daily. 06/02/23  Yes Cox, Kirsten, MD  sertraline (ZOLOFT) 100 MG tablet TAKE 1/2 TABLET(50 MG) BY MOUTH DAILY 08/24/23  Yes Cox, Kirsten, MD  tiZANidine (ZANAFLEX) 4 MG tablet Take 4 mg by mouth at bedtime. 11/09/20  Yes [provider]  topiramate (TOPAMAX) 50 MG tablet TAKE TWO (2) TABLETS BY MOUTH EVERY DAY AT BEDTIME Patient taking differently: Take 100 mg by mouth at bedtime. TAKE TWO (2) TABLETS BY MOUTH EVERY DAY AT BEDTIME 06/02/23  Yes Cox, Kirsten, MD  Ubrogepant (UBRELVY) 100 MG TABS Take 1 tablet (100 mg total) by mouth as  needed (May repeat after 2 hours.  Maximum 2 tablets in 24 hours). TAKE 1 TABLET BY MOUTH BY MOUTH AS NEEDED( MAY REPEAT 1 TABLET AFTER 2 HOURS IF NEEDED, MAXIMUM 2 TABLETS IN 24 HOURS) Strength: 100 mg 04/07/23  Yes Cox, Kirsten, MD  Vitamin D, Ergocalciferol, (DRISDOL) 1.25 MG (50000 UNIT) CAPS capsule TAKE 1 CAPSULE BY MOUTH EVERY 7 DAYS FOR 12 DOSES Patient taking differently: Take 50,000 Units by mouth every 7 (seven) days. Patient usually takes this medication on mondays 06/30/23  Yes Cox, Kirsten, MD  dextromethorphan 15 MG/5ML syrup Take 10 mLs (30 mg total) by mouth 4 (four) times daily as needed for cough. Patient not taking: Reported on 11/14/2023 10/22/23   Christella Hartigan,  Phyllis Ginger, FNP  guaiFENesin-codeine 100-10 MG/5ML syrup Take 5 mLs by mouth 3 (three) times daily as needed for cough. 10/16/23   [name], FNP  ondansetron (ZOFRAN) 4 MG tablet Take 1 tablet (4 mg total) by mouth every 8 (eight) hours as needed for nausea or vomiting. 05/17/20   Abigail Miyamoto, MD     Vital Signs: BP 125/69 (BP Location: Right Arm)   Pulse 86   Temp 98.2 F (36.8 C) (Oral)   Resp 18   Ht 4\' 10"  (1.473 m)   Wt 161 lb 6 oz (73.2 kg)   SpO2 98%   BMI 33.73 kg/m   Physical Exam; awake/alert; chest- CTA bilat ant; left chest wall AICD; heart- RRR; abd-soft,+BS,NT; right PCN intact, insertion site ok, not sig tender, OP blood-tinged ; flushed without difficulty per nurse ; no LE edema, some bil LE weakness  Imaging: NM Bone Scan Whole Body Result Date: 11/19/2023 CLINICAL DATA:  Bladder cancer. Severe low back pain that radiates into the bilateral legs. EXAM: NUCLEAR MEDICINE WHOLE BODY BONE SCAN TECHNIQUE: Whole body anterior and posterior images were obtained approximately 3 hours after intravenous injection of radiopharmaceutical. RADIOPHARMACEUTICALS:  19.6 mCi Technetium-44m MDP IV COMPARISON:  CT chest 10/17/2023 and CT abdomen pelvis 10/28/2023 and CT spine 11/15/2023 FINDINGS: Foci of  increased uptake involving the T5 and T7 vertebral bodies are compatible with the known compression fractures. Subtle areas of increased uptake along the left side of T2 and T3 may correspond with the areas of sclerosis on the previous cross-sectional imaging. Increased uptake near T1 corresponds with a compression fracture based on the CT from 11/15/2023. Focus of increased uptake involving the lateral left second rib corresponds with an area of cortical thickening and sclerosis on the previous CT. This could represent old left second rib fracture or a sclerotic lesion. Small foci involving the right eighth rib, right seventh rib and possibly the right sixth rib could be related to prior fractures based on previous CT. At least 2 areas of increased uptake involving the left side of the scapula. Difficult to exclude small left scapular lesions. Activity in the left renal collecting system. Minimal activity in the right renal collecting system. Slightly increased uptake along the left side of the calvarium is indeterminate. Increased uptake in the lower pelvis is likely involving the urinary bladder. No suspicious uptake in the appendicular skeleton. Probable degenerative changes in both shoulders. Focus of photopenia in the left chest compatible with patient's AICD. Slightly increased uptake in the sacrum is likely related to known sacral fractures. Photopenia in the right hip compatible with a right hip replacement. IMPRESSION: 1. Scattered areas of increased uptake involving multiple thoracic vertebral bodies. Areas of increased uptake involving T1, T5 and T7 compatible with known compression fractures. Based on the CT from 11/15/2023, there is concern for a lytic lesion involving the T1 left pedicle. Concern for metastatic disease at T1. Cannot exclude metastatic disease at T2, T3, T5 and T7. 2. Small foci in the left scapula and cannot exclude metastatic bone lesions in this area. 3. Bilateral rib foci. These  could be related to old rib fractures. However, the left second rib is suspicious for metastatic disease based on the prior CT. Electronically Signed   By: Richarda Overlie M.D.   On: 11/19/2023 19:09   CT PELVIS WO CONTRAST Result Date: 11/17/2023 CLINICAL DATA:  Bladder cancer to look for metastasis. EXAM: CT PELVIS WITHOUT CONTRAST TECHNIQUE: Multidetector CT imaging of the pelvis  was performed following the standard protocol without intravenous contrast. RADIATION DOSE REDUCTION: This exam was performed according to the departmental dose-optimization program which includes automated exposure control, adjustment of the mA and/or kV according to patient size and/or use of iterative reconstruction technique. COMPARISON:  CT abdomen and pelvis 10/25/2020 FINDINGS: Urinary Tract: Foley catheter decompresses the bladder. Distal left ureter appears dilated, possibly reflux or obstruction. No stones are demonstrated. Bowel: Visualized small and large bowel are not abnormally distended. Prominent diverticulosis of the sigmoid colon. No definitive inflammatory changes to suggest acute diverticulitis. Vascular/Lymphatic: Mild calcification in the iliac arteries. No significant lymphadenopathy. Reproductive:  Uterus and ovaries are not enlarged. Other: Nonspecific soft tissue thickening in the presacral perirectal fat. This could represent a hematoma in the setting of sacral fractures. See below. Alternatively, this could represent postradiation or postsurgical change if there has been surgery or radiation to this area. Alternatively, infectious, inflammatory, or neoplastic etiologies could be considered. No discrete mass or fluid collection. Musculoskeletal: Postoperative right hip arthroplasty. Degenerative changes in the lower lumbar spine. Nondisplaced fracture of the sacrum at the level of S2. This may represent a stress fracture or insufficiency fracture. Old appearing fracture deformity of the left inferior pubic ramus.  IMPRESSION: 1. Foley catheter decompresses the bladder, limiting evaluation. 2. No lymphadenopathy or soft tissue mass identified. 3. Colonic diverticulosis without evidence of acute diverticulitis. 4. Nonspecific soft tissue thickening in the presacral space. Possibly etiologies include infectious, inflammatory, postoperative/postradiation, or posttraumatic change. 5. Nondisplaced fractures of the sacrum at the level of S2, possibly stress or insufficiency fracture. No destructive bone lesions are identified. Electronically Signed   By: Burman Nieves M.D.   On: 11/17/2023 20:23   IR NEPHROSTOMY PLACEMENT RIGHT Result Date: 11/16/2023 INDICATION: 68 year old [name] with vaginal/bladder mass and right distal ureteral obstruction. Cystoscopy in attempted ureteral stent placement was unsuccessful. Therefore, patient requires placement of a percutaneous nephrostomy tube. EXAM: IR NEPHROSTOMY PLACEMENT RIGHT COMPARISON:  None Available. MEDICATIONS: No additional antibiotics administered. ANESTHESIA/SEDATION: Fentanyl 2 mcg IV; Versed 100 mg IV administered by the radiology nurse Moderate Sedation Time:  10 minutes The patient's vital signs and level of consciousness were continuously monitored during the procedure by the interventional radiology nurse under my direct supervision. CONTRAST:  15mL OMNIPAQUE IOHEXOL 300 MG/ML SOLN - administered into the collecting system(s) FLUOROSCOPY: Radiation exposure index: 2 mGy reference air kerma COMPLICATIONS: None immediate. TECHNIQUE: The procedure, risks, benefits, and alternatives were explained to the patient. Questions regarding the procedure were encouraged and answered. The patient understands and consents to the procedure. The right flank was prepped with chlorhexidine in a sterile fashion, and a sterile drape was applied covering the operative field. A sterile gown and sterile gloves were used for the procedure. Local anesthesia was provided with 1% Lidocaine. The  right flank was interrogated with ultrasound and the left kidney identified. The kidney is hydronephrotic. A suitable access site on the skin overlying the lower pole, posterior calix was identified. After local mg anesthesia was achieved, a small skin nick was made with an 11 blade scalpel. A 21 gauge Accustick needle was then advanced under direct sonographic guidance into the lower pole of the right kidney. A 0.018 inch wire was advanced under fluoroscopic guidance into the left renal collecting system. The Accustick sheath was then advanced over the wire and a 0.018 system exchanged for a 0.035 system. Gentle hand injection of contrast material confirms placement of the sheath within the renal collecting system. There is severe hydronephrosis. The  tract from the scan into the renal collecting system was then dilated serially to 10-French. A 10-French Cook all-purpose drain was then placed and positioned under fluoroscopic guidance. The locking loop is well formed within the left renal pelvis. The catheter was secured to the skin with 2-0 Prolene and a sterile bandage was placed. Catheter was left to gravity bag drainage. IMPRESSION: Successful placement of a right 10 French percutaneous nephrostomy tube. Electronically Signed   By: Malachy Moan M.D.   On: 11/16/2023 15:43    Labs:  CBC: Recent Labs    11/14/23 1649 11/15/23 0320 11/17/23 0350 11/19/23 0413  WBC 12.4* 11.8* 14.7* 10.7*  HGB 13.2 11.5* 10.5* 10.2*  HCT 41.9 35.7* 33.5* 33.7*  PLT 361 331 320 287    COAGS: Recent Labs    11/15/23 0320  INR 1.1  APTT 26    BMP: Recent Labs    11/17/23 0350 11/18/23 1331 11/19/23 0413 11/20/23 0413  NA 140 140 140 137  K 4.5 4.4 4.6 4.3  CL 112* 112* 114* 110  CO2 19* 20* 19* 18*  GLUCOSE 96 90 124* 115*  BUN 54* 45* 38* 26*  CALCIUM 8.4* 8.5* 8.4* 8.5*  CREATININE 1.80* 1.83* 1.57* 1.31*  GFRNONAA 30* 30* 35* 44*    LIVER FUNCTION TESTS: Recent Labs     06/02/23 1701 06/23/23 1516 09/25/23 1449 11/14/23 1649  BILITOT 0.2 0.3 0.3 0.5  AST 12 13 12 24   ALT 4 5 7 15   ALKPHOS 67 63 78 72  PROT 6.7 6.6 6.5 7.6  ALBUMIN 4.4 4.2 4.3 3.6    Assessment and Plan: 68 yo [name] with hx metastatic bladder cancer with distal ureteral obstruction -s/p right PCN 11/16/23, CHF, COPD, anemia, acute on CKD, spinal stenosis, compression deformities/? met dz T1,T5,T7; afebrile, creat 1.31(1.57); cont current tx; request received for poss thoracic KP/bx by oncology; pt's latest imaging has been reviewed by Drs.Hassell/ Deveshwar/McCullough ; as pt nontender in thoracic spine do not rec KP; will tent plan for CT guided T 7 biopsy on 3/28; risks and benefits of procedure was discussed with the patient including, but not limited to bleeding, infection, damage to adjacent structures or low yield requiring additional tests.  All of the questions were answered and there is agreement to proceed.  Consent signed and in chart.      Electronically Signed: D. Jeananne Rama, PA-C 11/20/2023, 2:52 PM   I spent a total of 25 minutes at the the patient's bedside AND on the patient's hospital floor or unit, greater than 50% of which was counseling/coordinating care for right percutaneous nephrostomy/ thoracic 7 vertebral body biopsy    Patient ID: [name], [name]   DOB: [birth year], 68 y.o.   MRN: [id number]

## 2023-11-21 ENCOUNTER — Inpatient Hospital Stay (HOSPITAL_COMMUNITY)

## 2023-11-21 DIAGNOSIS — E611 Iron deficiency: Secondary | ICD-10-CM

## 2023-11-21 DIAGNOSIS — R7989 Other specified abnormal findings of blood chemistry: Secondary | ICD-10-CM

## 2023-11-21 DIAGNOSIS — M8000XA Age-related osteoporosis with current pathological fracture, unspecified site, initial encounter for fracture: Secondary | ICD-10-CM

## 2023-11-21 DIAGNOSIS — D520 Dietary folate deficiency anemia: Secondary | ICD-10-CM

## 2023-11-21 DIAGNOSIS — G8220 Paraplegia, unspecified: Secondary | ICD-10-CM | POA: Diagnosis not present

## 2023-11-21 HISTORY — DX: Iron deficiency: E61.1

## 2023-11-21 HISTORY — DX: Other specified abnormal findings of blood chemistry: R79.89

## 2023-11-21 HISTORY — DX: Dietary folate deficiency anemia: D52.0

## 2023-11-21 LAB — CBC WITH DIFFERENTIAL/PLATELET
Abs Immature Granulocytes: 0.54 10*3/uL — ABNORMAL HIGH (ref 0.00–0.07)
Basophils Absolute: 0.1 10*3/uL (ref 0.0–0.1)
Basophils Relative: 1 %
Eosinophils Absolute: 0 10*3/uL (ref 0.0–0.5)
Eosinophils Relative: 0 %
HCT: 33.3 % — ABNORMAL LOW (ref 36.0–46.0)
Hemoglobin: 10.6 g/dL — ABNORMAL LOW (ref 12.0–15.0)
Immature Granulocytes: 5 %
Lymphocytes Relative: 10 %
Lymphs Abs: 1.1 10*3/uL (ref 0.7–4.0)
MCH: 29.4 pg (ref 26.0–34.0)
MCHC: 31.8 g/dL (ref 30.0–36.0)
MCV: 92.2 fL (ref 80.0–100.0)
Monocytes Absolute: 0.5 10*3/uL (ref 0.1–1.0)
Monocytes Relative: 5 %
Neutro Abs: 8.7 10*3/uL — ABNORMAL HIGH (ref 1.7–7.7)
Neutrophils Relative %: 79 %
Platelets: 268 10*3/uL (ref 150–400)
RBC: 3.61 MIL/uL — ABNORMAL LOW (ref 3.87–5.11)
RDW: 13.9 % (ref 11.5–15.5)
WBC: 10.9 10*3/uL — ABNORMAL HIGH (ref 4.0–10.5)
nRBC: 0 % (ref 0.0–0.2)

## 2023-11-21 LAB — COMPREHENSIVE METABOLIC PANEL WITH GFR
ALT: 10 U/L (ref 0–44)
AST: 14 U/L — ABNORMAL LOW (ref 15–41)
Albumin: 2.5 g/dL — ABNORMAL LOW (ref 3.5–5.0)
Alkaline Phosphatase: 81 U/L (ref 38–126)
Anion gap: 9 (ref 5–15)
BUN: 26 mg/dL — ABNORMAL HIGH (ref 8–23)
CO2: 21 mmol/L — ABNORMAL LOW (ref 22–32)
Calcium: 8.5 mg/dL — ABNORMAL LOW (ref 8.9–10.3)
Chloride: 106 mmol/L (ref 98–111)
Creatinine, Ser: 1.39 mg/dL — ABNORMAL HIGH (ref 0.44–1.00)
GFR, Estimated: 41 mL/min — ABNORMAL LOW (ref >60)
Glucose, Bld: 127 mg/dL — ABNORMAL HIGH (ref 70–99)
Potassium: 4.3 mmol/L (ref 3.5–5.1)
Sodium: 136 mmol/L (ref 135–145)
Total Bilirubin: 0.3 mg/dL (ref 0.0–1.2)
Total Protein: 5.2 g/dL — ABNORMAL LOW (ref 6.5–8.1)

## 2023-11-21 LAB — GLUCOSE, CAPILLARY: Glucose-Capillary: 282 mg/dL — ABNORMAL HIGH (ref 70–99)

## 2023-11-21 LAB — IRON AND TIBC
Iron: 52 ug/dL (ref 28–170)
Saturation Ratios: 19 % (ref 10.4–31.8)
TIBC: 267 ug/dL (ref 250–450)
UIBC: 215 ug/dL

## 2023-11-21 LAB — MAGNESIUM: Magnesium: 1.6 mg/dL — ABNORMAL LOW (ref 1.7–2.4)

## 2023-11-21 LAB — PHOSPHORUS: Phosphorus: 2.4 mg/dL — ABNORMAL LOW (ref 2.5–4.6)

## 2023-11-21 MED ORDER — FENTANYL CITRATE (PF) 100 MCG/2ML IJ SOLN
INTRAMUSCULAR | Status: AC | PRN
  Administered 2023-11-21 (×2): 50 ug via INTRAVENOUS

## 2023-11-21 MED ORDER — FENTANYL CITRATE (PF) 100 MCG/2ML IJ SOLN
INTRAMUSCULAR | Status: AC
  Filled 2023-11-21: qty 2

## 2023-11-21 MED ORDER — MIDAZOLAM HCL 2 MG/2ML IJ SOLN
INTRAMUSCULAR | Status: AC
  Filled 2023-11-21: qty 4

## 2023-11-21 MED ORDER — FOLIC ACID 1 MG PO TABS
1.0000 mg | ORAL_TABLET | Freq: Every day | ORAL | Status: DC
  Administered 2023-11-21 – 2023-11-24 (×4): 1 mg via ORAL
  Filled 2023-11-21 (×4): qty 1

## 2023-11-21 MED ORDER — VITAMIN B-12 1000 MCG PO TABS
1000.0000 ug | ORAL_TABLET | Freq: Every day | ORAL | Status: DC
  Administered 2023-11-21 – 2023-11-24 (×4): 1000 ug via ORAL
  Filled 2023-11-21 (×4): qty 1

## 2023-11-21 MED ORDER — FERROUS SULFATE 325 (65 FE) MG PO TABS
325.0000 mg | ORAL_TABLET | Freq: Every day | ORAL | Status: DC
Start: 2023-11-21 — End: 2023-11-24
  Administered 2023-11-21 – 2023-11-23 (×3): 325 mg via ORAL
  Filled 2023-11-21 (×3): qty 1

## 2023-11-21 MED ORDER — OYSTER SHELL CALCIUM/D3 500-5 MG-MCG PO TABS
2.0000 | ORAL_TABLET | Freq: Every day | ORAL | Status: DC
  Administered 2023-11-22 – 2023-11-24 (×3): 2 via ORAL
  Filled 2023-11-21 (×3): qty 2

## 2023-11-21 MED ORDER — MIDAZOLAM HCL 2 MG/2ML IJ SOLN
INTRAMUSCULAR | Status: AC | PRN
Start: 2023-11-21 — End: 2023-11-21
  Administered 2023-11-21 (×2): 1 mg via INTRAVENOUS

## 2023-11-21 MED ORDER — SODIUM CHLORIDE 0.9 % IV SOLN
INTRAVENOUS | Status: AC
  Filled 2023-11-21: qty 500

## 2023-11-21 NOTE — TOC Progression Note (Signed)
 Transition of Care [facility]) - Progression Note    Patient Details  Name: [name] MRN: [id number] Date of Birth: 1955-09-28  [facility] CM/SW Contact  Larrie Kass, LCSW Phone Number: 11/21/2023, 1:37 PM  Clinical Narrative:     CSW spoke with tracy with [facility] she has declined pt for SNF.   CSW awaiting call back from Admission director of [facility]. TOC to follow.    Expected Discharge Plan: Skilled Nursing Facility Barriers to Discharge: Continued Medical Work up  Expected Discharge Plan and Services       Living arrangements for the past 2 months: Single Family Home                                       Social Determinants of Health (SDOH) Interventions SDOH Screenings   Food Insecurity: No Food Insecurity (11/15/2023)  Housing: Low Risk  (11/15/2023)  Transportation Needs: Unmet Transportation Needs (11/15/2023)  Utilities: Not At Risk (11/15/2023)  Alcohol Screen: Low Risk  (12/31/2022)  Depression (PHQ2-9): High Risk (09/25/2023)  Financial Resource Strain: Low Risk  (04/05/2023)  Physical Activity: Insufficiently Active (04/05/2023)  Social Connections: Moderately Isolated (11/15/2023)  Stress: Stress Concern Present (04/05/2023)  Tobacco Use: Medium Risk (11/16/2023)  Health Literacy: Adequate Health Literacy (04/07/2023)    Readmission Risk Interventions     No data to display

## 2023-11-21 NOTE — Progress Notes (Signed)
 Patient down in IR.  I will arrange a follow-up with Dr. Berneice Heinrich to discuss potential surgical options to address her muscle invasive bladder cancer, pending T5 biopsy results from today.    -Dr. Liliane Shi

## 2023-11-21 NOTE — Progress Notes (Signed)
 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  We are consulted for this reason.  CT showed T5, T7, L1, S2 compression fracture and sclerotic lesion T2, T3.  Cystoscopy with TURBT show right sided bladder tumor with palpable anterior vaginal mass.  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   Staging is unclear. Her compression fractures seems to be from long standing osteoporosis she is aware of. No significant sclerotic lesions reported other than T2/3. She lives in [place].    Biopsy on T5 right transverse process done today.     She has improvement of weakness and up with walker today. Motivated to improve.  Bone lesions Follow up with biopsy results next week.  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 Staging not entirely clear with a few sclerotic lesion. If path non-diagnostic, will have PET as outpatient.    AKI (acute kidney injury) (HCC) With CKD Improved after nephrostomy tube and TURBT Continue adequate hydration   Paraplegia (HCC) Improving weakness. 4/4 in LE today  Folate deficiency anemia Folic acid 1 mg daily  Low Z30 B12 1000 mcg daily  Iron deficiency Ferrous sulfate 325 mg daily  All questions were answered.   Thank you for the consult. Will follow with you next week. Melven Sartorius, MD 11/21/2023 3:54 PM  Subjective:  [name] reports feeling stronger. Up and walk with walker. Biopsy was done earlier. Tolerated well. No chest pain, abdominal pain, bleeding.  Good urine output and trying to drink more.  Objective:  Vitals:   11/21/23 1235 11/21/23 1306  BP: 126/72 (!) 101/59  Pulse: 83 70  Resp: 13   Temp:  98.5 F (36.9 C)  SpO2: 100% 100%     Intake/Output Summary (Last 24 hours) at 11/21/2023 1554 Last  data filed at 11/21/2023 0445 Gross per 24 hour  Intake 10 ml  Output 765 ml  Net -755 ml    GENERAL: alert, no distress and comfortable SKIN: skin color normal LUNGS:  normal breathing effort.  ABDOMEN: abdomen soft, non-tender and non-distended GU: Right nephrostomy tube.  Foley in place Musculoskeletal: no lower extremity edema NEURO: alert with fluent speech, lower extremity 5 out of 5.   Labs:  Recent Labs    09/25/23 1449 11/14/23 1649 11/15/23 0320 11/19/23 0413 11/20/23 0413 11/21/23 0341  NA 139 135   < > 140 137 136  K 4.6 4.5   < > 4.6 4.3 4.3  CL 108* 101   < > 114* 110 106  CO2 16* 19*   < > 19* 18* 21*  GLUCOSE 131* 147*   < > 124* 115* 127*  BUN 32* 83*   < > 38* 26* 26*  CREATININE 1.76* 2.79*   < > 1.57* 1.31* 1.39*  CALCIUM 9.9 9.5   < > 8.4* 8.5* 8.5*  GFRNONAA  --  18*   < > 35* 44* 41*  PROT 6.5 7.6  --   --   --  5.2*  ALBUMIN 4.3 3.6  --   --   --  2.5*  AST 12 24  --   --   --  14*  ALT 7 15  --   --   --  10  ALKPHOS 78 72  --   --   --  81  BILITOT 0.3 0.5  --   --   --  0.3   < > = values in this interval not displayed.    Studies:  CT BONE TROCAR/NEEDLE BIOPSY DEEP Result Date: 11/21/2023 INDICATION: Newly diagnosed bladder cancer with multifocal osseous metastatic disease. Patient presents for attempted CT-guided biopsy of the right transverse process of T5. EXAM: CT-guided deep bone biopsy TECHNIQUE: Multidetector CT imaging of the thoracic spine was performed following the standard protocol without IV contrast. RADIATION DOSE REDUCTION: This exam was performed according to the departmental dose-optimization program which includes automated exposure control, adjustment of the mA and/or kV according to patient size and/or use of iterative reconstruction technique. MEDICATIONS: None. ANESTHESIA/SEDATION: Moderate (conscious) sedation was employed during this procedure. A total of Versed 2 mg and Fentanyl 100 mcg was administered intravenously by  the radiology nurse. Total intra-service moderate Sedation Time: 18 minutes. The patient's level of consciousness and vital signs were monitored continuously by radiology nursing throughout the procedure under my direct supervision. COMPLICATIONS: None immediate. PROCEDURE: Informed written consent was obtained from the patient after a thorough discussion of the procedural risks, benefits and alternatives. All questions were addressed. Maximal Sterile Barrier Technique was utilized including caps, mask, sterile gowns, sterile gloves, sterile drape, hand hygiene and skin antiseptic. A timeout was performed prior to the initiation of the procedure. Axial CT imaging was performed. The irregular month eaten appearance of the transverse process on the right at T5 was successfully identified. A suitable skin entry site was selected and marked. Local anesthesia was attained by infiltration with 1% lidocaine. A small dermatotomy was made. Under careful intermittent CT guidance, the 11 gauge osteo introducer was advanced and positioned against the tip of the spinous process. Several 9 gauge core biopsies were then obtained coaxially through the abnormal appearing transverse process. Biopsy specimens were placed in formalin and delivered to pathology for further analysis. The patient tolerated the procedure well. Post CT imaging demonstrates no evidence of immediate complication. IMPRESSION: Technically successful CT-guided core biopsy of the right transverse process at T5. Electronically Signed   By: Malachy Moan M.D.   On: 11/21/2023 14:28   CT THORACIC SPINE WO CONTRAST Addendum Date: 11/20/2023 ADDENDUM REPORT: 11/20/2023 16:04 ADDENDUM: And expansile lytic lesion is noted posteriorly on the left at L1, along the posterior margin the fracture. This extends into the left pedicle and left facet. The lesion extends into the left transverse process. Note is also made of a sacral insufficiency fracture involving the  sacral ala bilaterally and transversely at S2. Electronically Signed   By: Marin Roberts M.D.   On: 11/20/2023 16:04   Result Date: 11/20/2023 CLINICAL DATA:  Mid-back pain, abnormal neuro, positive xray (Ped 0-17y); Low back pain, cauda equina syndrome suspected EXAM: CT THORACIC AND LUMBAR SPINE WITHOUT CONTRAST TECHNIQUE: Multidetector CT imaging of the thoracic and lumbar spine was performed without contrast. Multiplanar CT image reconstructions were also generated. RADIATION DOSE REDUCTION: This exam was performed according to the departmental dose-optimization program which includes automated exposure control, adjustment of the mA and/or kV according to patient size and/or use of iterative reconstruction technique. COMPARISON:  MRI lumbar spine 12/12/2008, CT chest 10/17/2023: CT abdomen pelvis 10/28/2023 FINDINGS: CT THORACIC SPINE FINDINGS Alignment: Normal. Vertebrae: Diffusely decreased bone density. Interval development of an age-indeterminate T1 compression fracture with at least 40% vertebral body height loss. Chronic stable T5, T7 compression fracture. Redemonstration of scattered sclerotic axial skeleton lesions. Multilevel mild degenerative changes of the spine. Paraspinal  and other soft tissues: Negative. Disc levels: Maintained. CT LUMBAR SPINE FINDINGS Segmentation: 5 lumbar type vertebrae. Alignment: Normal. Vertebrae: Diffusely decreased bone density. No severe osseous neural foraminal or central canal stenosis. L3-L4, L4-L5, L5-S1 disc bulge with question at least moderate central canal stenosis at the L4-L5 level. No acute fracture or focal pathologic process. Paraspinal and other soft tissues: Negative. Disc levels: Maintained. Other:. Diffuse bronchial wall thickening. Limited evaluation of the lungs due to motion artifact. Severe right hydroureteronephrosis that is partially visualized. Atherosclerotic plaque. Cardiac leads. Status post cholecystectomy. Acute minimally displaced  right posterior first rib fracture (5:21). IMPRESSION: IMPRESSION CT THORACIC SPINE IMPRESSION 1. No acute displaced fracture or traumatic listhesis of the thoracic spine. 2. Redemonstration of scattered sclerotic axial skeleton lesions. Question metastases. CT LUMBAR SPINE IMPRESSION 1. No acute displaced fracture or traumatic listhesis of the lumbar spine. 2. L3-L4, L4-L5, L5-S1 disc bulge with question at least moderate central canal stenosis at the L4-L5 level. Recommend MRI for further evaluation. Other imaging findings of potential clinical significance: 1. Acute minimally displaced right posterior first rib fracture 2. Severe right hydroureteronephrosis partially visualized. Finding better evaluated on CT abdomen pelvis 10/28/2023 3.  Aortic Atherosclerosis (ICD10-I70.0). Electronically Signed: By: Tish Frederickson M.D. On: 11/14/2023 20:53   CT LUMBAR SPINE WO CONTRAST Addendum Date: 11/20/2023 ADDENDUM REPORT: 11/20/2023 16:04 ADDENDUM: And expansile lytic lesion is noted posteriorly on the left at L1, along the posterior margin the fracture. This extends into the left pedicle and left facet. The lesion extends into the left transverse process. Note is also made of a sacral insufficiency fracture involving the sacral ala bilaterally and transversely at S2. Electronically Signed   By: Marin Roberts M.D.   On: 11/20/2023 16:04   Result Date: 11/20/2023 CLINICAL DATA:  Mid-back pain, abnormal neuro, positive xray (Ped 0-17y); Low back pain, cauda equina syndrome suspected EXAM: CT THORACIC AND LUMBAR SPINE WITHOUT CONTRAST TECHNIQUE: Multidetector CT imaging of the thoracic and lumbar spine was performed without contrast. Multiplanar CT image reconstructions were also generated. RADIATION DOSE REDUCTION: This exam was performed according to the departmental dose-optimization program which includes automated exposure control, adjustment of the mA and/or kV according to patient size and/or use of  iterative reconstruction technique. COMPARISON:  MRI lumbar spine 12/12/2008, CT chest 10/17/2023: CT abdomen pelvis 10/28/2023 FINDINGS: CT THORACIC SPINE FINDINGS Alignment: Normal. Vertebrae: Diffusely decreased bone density. Interval development of an age-indeterminate T1 compression fracture with at least 40% vertebral body height loss. Chronic stable T5, T7 compression fracture. Redemonstration of scattered sclerotic axial skeleton lesions. Multilevel mild degenerative changes of the spine. Paraspinal and other soft tissues: Negative. Disc levels: Maintained. CT LUMBAR SPINE FINDINGS Segmentation: 5 lumbar type vertebrae. Alignment: Normal. Vertebrae: Diffusely decreased bone density. No severe osseous neural foraminal or central canal stenosis. L3-L4, L4-L5, L5-S1 disc bulge with question at least moderate central canal stenosis at the L4-L5 level. No acute fracture or focal pathologic process. Paraspinal and other soft tissues: Negative. Disc levels: Maintained. Other:. Diffuse bronchial wall thickening. Limited evaluation of the lungs due to motion artifact. Severe right hydroureteronephrosis that is partially visualized. Atherosclerotic plaque. Cardiac leads. Status post cholecystectomy. Acute minimally displaced right posterior first rib fracture (5:21). IMPRESSION: IMPRESSION CT THORACIC SPINE IMPRESSION 1. No acute displaced fracture or traumatic listhesis of the thoracic spine. 2. Redemonstration of scattered sclerotic axial skeleton lesions. Question metastases. CT LUMBAR SPINE IMPRESSION 1. No acute displaced fracture or traumatic listhesis of the lumbar spine. 2. L3-L4, L4-L5, L5-S1 disc bulge  with question at least moderate central canal stenosis at the L4-L5 level. Recommend MRI for further evaluation. Other imaging findings of potential clinical significance: 1. Acute minimally displaced right posterior first rib fracture 2. Severe right hydroureteronephrosis partially visualized. Finding better  evaluated on CT abdomen pelvis 10/28/2023 3.  Aortic Atherosclerosis (ICD10-I70.0). Electronically Signed: By: Tish Frederickson M.D. On: 11/14/2023 20:53   NM Bone Scan Whole Body Result Date: 11/19/2023 CLINICAL DATA:  Bladder cancer. Severe low back pain that radiates into the bilateral legs. EXAM: NUCLEAR MEDICINE WHOLE BODY BONE SCAN TECHNIQUE: Whole body anterior and posterior images were obtained approximately 3 hours after intravenous injection of radiopharmaceutical. RADIOPHARMACEUTICALS:  19.6 mCi Technetium-70m MDP IV COMPARISON:  CT chest 10/17/2023 and CT abdomen pelvis 10/28/2023 and CT spine 11/15/2023 FINDINGS: Foci of increased uptake involving the T5 and T7 vertebral bodies are compatible with the known compression fractures. Subtle areas of increased uptake along the left side of T2 and T3 may correspond with the areas of sclerosis on the previous cross-sectional imaging. Increased uptake near T1 corresponds with a compression fracture based on the CT from 11/15/2023. Focus of increased uptake involving the lateral left second rib corresponds with an area of cortical thickening and sclerosis on the previous CT. This could represent old left second rib fracture or a sclerotic lesion. Small foci involving the right eighth rib, right seventh rib and possibly the right sixth rib could be related to prior fractures based on previous CT. At least 2 areas of increased uptake involving the left side of the scapula. Difficult to exclude small left scapular lesions. Activity in the left renal collecting system. Minimal activity in the right renal collecting system. Slightly increased uptake along the left side of the calvarium is indeterminate. Increased uptake in the lower pelvis is likely involving the urinary bladder. No suspicious uptake in the appendicular skeleton. Probable degenerative changes in both shoulders. Focus of photopenia in the left chest compatible with patient's AICD. Slightly increased  uptake in the sacrum is likely related to known sacral fractures. Photopenia in the right hip compatible with a right hip replacement. IMPRESSION: 1. Scattered areas of increased uptake involving multiple thoracic vertebral bodies. Areas of increased uptake involving T1, T5 and T7 compatible with known compression fractures. Based on the CT from 11/15/2023, there is concern for a lytic lesion involving the T1 left pedicle. Concern for metastatic disease at T1. Cannot exclude metastatic disease at T2, T3, T5 and T7. 2. Small foci in the left scapula and cannot exclude metastatic bone lesions in this area. 3. Bilateral rib foci. These could be related to old rib fractures. However, the left second rib is suspicious for metastatic disease based on the prior CT. Electronically Signed   By: Richarda Overlie M.D.   On: 11/19/2023 19:09   CT PELVIS WO CONTRAST Result Date: 11/17/2023 CLINICAL DATA:  Bladder cancer to look for metastasis. EXAM: CT PELVIS WITHOUT CONTRAST TECHNIQUE: Multidetector CT imaging of the pelvis was performed following the standard protocol without intravenous contrast. RADIATION DOSE REDUCTION: This exam was performed according to the departmental dose-optimization program which includes automated exposure control, adjustment of the mA and/or kV according to patient size and/or use of iterative reconstruction technique. COMPARISON:  CT abdomen and pelvis 10/25/2020 FINDINGS: Urinary Tract: Foley catheter decompresses the bladder. Distal left ureter appears dilated, possibly reflux or obstruction. No stones are demonstrated. Bowel: Visualized small and large bowel are not abnormally distended. Prominent diverticulosis of the sigmoid colon. No definitive inflammatory changes  to suggest acute diverticulitis. Vascular/Lymphatic: Mild calcification in the iliac arteries. No significant lymphadenopathy. Reproductive:  Uterus and ovaries are not enlarged. Other: Nonspecific soft tissue thickening in the  presacral perirectal fat. This could represent a hematoma in the setting of sacral fractures. See below. Alternatively, this could represent postradiation or postsurgical change if there has been surgery or radiation to this area. Alternatively, infectious, inflammatory, or neoplastic etiologies could be considered. No discrete mass or fluid collection. Musculoskeletal: Postoperative right hip arthroplasty. Degenerative changes in the lower lumbar spine. Nondisplaced fracture of the sacrum at the level of S2. This may represent a stress fracture or insufficiency fracture. Old appearing fracture deformity of the left inferior pubic ramus. IMPRESSION: 1. Foley catheter decompresses the bladder, limiting evaluation. 2. No lymphadenopathy or soft tissue mass identified. 3. Colonic diverticulosis without evidence of acute diverticulitis. 4. Nonspecific soft tissue thickening in the presacral space. Possibly etiologies include infectious, inflammatory, postoperative/postradiation, or posttraumatic change. 5. Nondisplaced fractures of the sacrum at the level of S2, possibly stress or insufficiency fracture. No destructive bone lesions are identified. Electronically Signed   By: Burman Nieves M.D.   On: 11/17/2023 20:23   IR NEPHROSTOMY PLACEMENT RIGHT Result Date: 11/16/2023 INDICATION: 68 year old female with vaginal/bladder mass and right distal ureteral obstruction. Cystoscopy in attempted ureteral stent placement was unsuccessful. Therefore, patient requires placement of a percutaneous nephrostomy tube. EXAM: IR NEPHROSTOMY PLACEMENT RIGHT COMPARISON:  None Available. MEDICATIONS: No additional antibiotics administered. ANESTHESIA/SEDATION: Fentanyl 2 mcg IV; Versed 100 mg IV administered by the radiology nurse Moderate Sedation Time:  10 minutes The patient's vital signs and level of consciousness were continuously monitored during the procedure by the interventional radiology nurse under my direct supervision.  CONTRAST:  15mL OMNIPAQUE IOHEXOL 300 MG/ML SOLN - administered into the collecting system(s) FLUOROSCOPY: Radiation exposure index: 2 mGy reference air kerma COMPLICATIONS: None immediate. TECHNIQUE: The procedure, risks, benefits, and alternatives were explained to the patient. Questions regarding the procedure were encouraged and answered. The patient understands and consents to the procedure. The right flank was prepped with chlorhexidine in a sterile fashion, and a sterile drape was applied covering the operative field. A sterile gown and sterile gloves were used for the procedure. Local anesthesia was provided with 1% Lidocaine. The right flank was interrogated with ultrasound and the left kidney identified. The kidney is hydronephrotic. A suitable access site on the skin overlying the lower pole, posterior calix was identified. After local mg anesthesia was achieved, a small skin nick was made with an 11 blade scalpel. A 21 gauge Accustick needle was then advanced under direct sonographic guidance into the lower pole of the right kidney. A 0.018 inch wire was advanced under fluoroscopic guidance into the left renal collecting system. The Accustick sheath was then advanced over the wire and a 0.018 system exchanged for a 0.035 system. Gentle hand injection of contrast material confirms placement of the sheath within the renal collecting system. There is severe hydronephrosis. The tract from the scan into the renal collecting system was then dilated serially to 10-French. A 10-French Cook all-purpose drain was then placed and positioned under fluoroscopic guidance. The locking loop is well formed within the left renal pelvis. The catheter was secured to the skin with 2-0 Prolene and a sterile bandage was placed. Catheter was left to gravity bag drainage. IMPRESSION: Successful placement of a right 10 French percutaneous nephrostomy tube. Electronically Signed   By: Malachy Moan M.D.   On: 11/16/2023 15:43    DG  C-Arm 1-60 Min-No Report Result Date: 11/16/2023 Fluoroscopy was utilized by the requesting physician.  No radiographic interpretation.   DG C-Arm 1-60 Min-No Report Result Date: 11/16/2023 Fluoroscopy was utilized by the requesting physician.  No radiographic interpretation.   CT THORACIC SPINE WO CONTRAST Result Date: 11/15/2023 CLINICAL DATA:  Back pain. Lower extremity weakness. Question cauda acquired syndrome. EXAM: CT MYELOGRAPHY LUMBAR SPINE TECHNIQUE: CT imaging of the thoracic and lumbar spine was performed after Isovue 300M contrast administration. Multiplanar CT image reconstructions were also generated. RADIATION DOSE REDUCTION: This exam was performed according to the departmental dose-optimization program which includes automated exposure control, adjustment of the mA and/or kV according to patient size and/or use of iterative reconstruction technique. COMPARISON:  CT of the thoracic and lumbar spine 11/14/2023. FINDINGS: Thoracic spine Alignment: No significant listhesis is present. Mild rightward curvature is centered at T6. Mild straightening of the normal thoracic kyphosis is stable. Vertebrae: The superior endplate compression fracture at L1 is more severe on the left. Slight retropulsed bone is present without focal stenosis. The superior endplate fracture at T5 demonstrates 30-40% loss of height. No retropulsed bone is present. This may be subacute. Superior endplate fracture at T7 is more acute, with 40-50% loss of height. Slight retropulsed bone is present without significant stenosis. Progressive collapse is present at this level. Sclerotic lesions are present on the left at T2 and T3 extending into the left pedicle at T3. Conus medullaris: Extends to the  level and appears normal. Paraspinal and other soft tissues: The paraspinous soft tissues are within normal limits. The visualized lung fields are clear. Pacing wires are in place. Severe right-sided hydronephrosis is again  noted. Disc levels: No significant central canal stenosis or cord compression is present. Mild right foraminal narrowing present at T8-9, T9-10 and T10-11. Mild left foraminal narrowing present at T5-6 and T6-7. Lumbar spine Segmentation: 5 non rib-bearing lumbar type vertebral bodies are present. The lowest fully formed vertebral body is L5. Alignment: No significant listhesis is present. Levoconvex curvature is centered at L1-2. Mild straightening of the normal lumbar lordosis is present. Vertebrae: Vertebral body heights are normal. No acute or healing fractures are present. Conus medullaris: Extends to the T12-L1   Level and appears normal. Paraspinal and other soft tissues: Atherosclerotic calcifications are present the aorta and branch vessels. No adenopathy is present. Severe right-sided hydronephrosis is again noted. Disc levels: L1-2: Normal disc signal and height is present. No focal protrusion or stenosis is present. L2-3: Normal disc signal and height is present. No focal protrusion or stenosis is present. L3-4: A mild broad-based disc bulge is present. Mild facet hypertrophy is noted bilaterally. Disc material extends into the foramina bilaterally. Mild right foraminal narrowing is present. L4-5: A leftward disc protrusion is present. Moderate left subarticular and foraminal narrowing is present. Mild right foraminal narrowing is present. L5-S1: Chronic loss of disc height is present. A leftward disc protrusion likely contacts the traversing left S1 nerve roots. Facet spurring contributes to mild foraminal narrowing bilaterally, left greater than right. IMPRESSION: 1. No significant cord compression or central canal stenosis to explain the patient's symptoms. 2. Acute superior endplate compression fracture at T7 with 40-50% loss of height. Slight retropulsed bone is present without significant stenosis. 3. Superior endplate compression fracture at T5 with 30-40% loss of height. No retropulsed bone is  present. This may be subacute. 4. Superior endplate compression fracture at L1 is more severe on the left. Slight retropulsed bone is present without focal stenosis. 5.  Sclerotic lesions on the left at T2 and T3 extending into the left pedicle at T3. These are concerning for metastatic disease. 6. Mild right foraminal narrowing at T8-9, T9-10 and T10-11. 7. Mild left foraminal narrowing at T5-6 and T6-7. 8. Moderate left subarticular and foraminal narrowing at L4-5. 9. Mild foraminal narrowing bilaterally at L5-S1, left greater than right. 10. Severe right-sided hydronephrosis is again noted. 11.  Aortic Atherosclerosis (ICD10-I70.0). These results were called by telephone at the time of interpretation on 11/15/2023 at 1:50 pm to Dr. Coletta Memos, Who verbally acknowledged these results. Electronically Signed   By: Marin Roberts M.D.   On: 11/15/2023 15:31   CT LUMBAR SPINE WO CONTRAST Result Date: 11/15/2023 CLINICAL DATA:  Back pain. Lower extremity weakness. Question cauda acquired syndrome. EXAM: CT MYELOGRAPHY LUMBAR SPINE TECHNIQUE: CT imaging of the thoracic and lumbar spine was performed after Isovue 300M contrast administration. Multiplanar CT image reconstructions were also generated. RADIATION DOSE REDUCTION: This exam was performed according to the departmental dose-optimization program which includes automated exposure control, adjustment of the mA and/or kV according to patient size and/or use of iterative reconstruction technique. COMPARISON:  CT of the thoracic and lumbar spine 11/14/2023. FINDINGS: Thoracic spine Alignment: No significant listhesis is present. Mild rightward curvature is centered at T6. Mild straightening of the normal thoracic kyphosis is stable. Vertebrae: The superior endplate compression fracture at L1 is more severe on the left. Slight retropulsed bone is present without focal stenosis. The superior endplate fracture at T5 demonstrates 30-40% loss of height. No  retropulsed bone is present. This may be subacute. Superior endplate fracture at T7 is more acute, with 40-50% loss of height. Slight retropulsed bone is present without significant stenosis. Progressive collapse is present at this level. Sclerotic lesions are present on the left at T2 and T3 extending into the left pedicle at T3. Conus medullaris: Extends to the  level and appears normal. Paraspinal and other soft tissues: The paraspinous soft tissues are within normal limits. The visualized lung fields are clear. Pacing wires are in place. Severe right-sided hydronephrosis is again noted. Disc levels: No significant central canal stenosis or cord compression is present. Mild right foraminal narrowing present at T8-9, T9-10 and T10-11. Mild left foraminal narrowing present at T5-6 and T6-7. Lumbar spine Segmentation: 5 non rib-bearing lumbar type vertebral bodies are present. The lowest fully formed vertebral body is L5. Alignment: No significant listhesis is present. Levoconvex curvature is centered at L1-2. Mild straightening of the normal lumbar lordosis is present. Vertebrae: Vertebral body heights are normal. No acute or healing fractures are present. Conus medullaris: Extends to the T12-L1   Level and appears normal. Paraspinal and other soft tissues: Atherosclerotic calcifications are present the aorta and branch vessels. No adenopathy is present. Severe right-sided hydronephrosis is again noted. Disc levels: L1-2: Normal disc signal and height is present. No focal protrusion or stenosis is present. L2-3: Normal disc signal and height is present. No focal protrusion or stenosis is present. L3-4: A mild broad-based disc bulge is present. Mild facet hypertrophy is noted bilaterally. Disc material extends into the foramina bilaterally. Mild right foraminal narrowing is present. L4-5: A leftward disc protrusion is present. Moderate left subarticular and foraminal narrowing is present. Mild right foraminal  narrowing is present. L5-S1: Chronic loss of disc height is present. A leftward disc protrusion likely contacts the traversing left S1 nerve roots. Facet spurring contributes to mild foraminal narrowing bilaterally, left greater than right. IMPRESSION: 1. No significant cord compression  or central canal stenosis to explain the patient's symptoms. 2. Acute superior endplate compression fracture at T7 with 40-50% loss of height. Slight retropulsed bone is present without significant stenosis. 3. Superior endplate compression fracture at T5 with 30-40% loss of height. No retropulsed bone is present. This may be subacute. 4. Superior endplate compression fracture at L1 is more severe on the left. Slight retropulsed bone is present without focal stenosis. 5. Sclerotic lesions on the left at T2 and T3 extending into the left pedicle at T3. These are concerning for metastatic disease. 6. Mild right foraminal narrowing at T8-9, T9-10 and T10-11. 7. Mild left foraminal narrowing at T5-6 and T6-7. 8. Moderate left subarticular and foraminal narrowing at L4-5. 9. Mild foraminal narrowing bilaterally at L5-S1, left greater than right. 10. Severe right-sided hydronephrosis is again noted. 11.  Aortic Atherosclerosis (ICD10-I70.0). These results were called by telephone at the time of interpretation on 11/15/2023 at 1:50 pm to Dr. Coletta Memos, Who verbally acknowledged these results. Electronically Signed   By: Marin Roberts M.D.   On: 11/15/2023 15:31   DG MYELOGRAPHY LUMBAR INJ MULTI REGION Result Date: 11/15/2023 CLINICAL DATA:  68 year old female with history of back pain, lower extremity weakness. Contrast injection for CT myelogram requested. EXAM: LUMBAR MYELOGRAM FLUOROSCOPY: dictate in minutes and seconds PROCEDURE: After thorough discussion of risks and benefits of the procedure including bleeding, infection, injury to nerves, blood vessels, adjacent structures as well as headache and CSF leak, written and  oral informed consent was obtained. Time out form was completed. Patient was positioned prone on the fluoroscopy table. Local anesthesia was provided with 1% lidocaine without epinephrine after prepped and draped in the usual sterile fashion. Puncture was performed at L2-L3 using a 3 1/2 inch 20-gauge spinal needle via left approach. Using a single pass through the dura, the needle was placed within the thecal sac, with return of clear CSF. 15 mL of Omnipaque 180 was injected into the thecal sac, with normal opacification of the nerve roots and cauda equina consistent with free flow within the subarachnoid space. FINDINGS: Contiguous axial images were obtained through the Lumbar spine after the intrathecal infusion with appropriate contrast dispersal. IMPRESSION: Successful intrathecal administration of contrast for CT myelogram. Performed by: Loyce Dys PA-C Supervised by Dr. Chryl Heck, MD Electronically Signed   By: Malachy Moan M.D.   On: 11/15/2023 14:34   DG Chest 2 View Result Date: 11/06/2023 CLINICAL DATA:  Chest pain.  COPD. EXAM: CHEST - 2 VIEW COMPARISON:  October 10, 2023. FINDINGS: Stable cardiomediastinal silhouette. Left-sided defibrillator is unchanged. Both lungs are clear. The visualized skeletal structures are unremarkable. IMPRESSION: No active cardiopulmonary disease. Electronically Signed   By: Lupita Raider M.D.   On: 11/06/2023 17:09

## 2023-11-21 NOTE — Progress Notes (Signed)
 PROGRESS NOTE  [name] ZOX:[id number] DOB: [birth year] DOA: 11/14/2023 PCP: [name], FNP   LOS: 7 days   Brief Narrative / Interim history: 68 year old female with history of COPD, bladder cancer, spinal stenosis who presented to the emergency department with complaint of back pain radiating to her hips, inability to ambulate, weakness of bilateral lower extremity, lack of bowel movement. Hemodynamically stable on presentation. MRI could not be done because of presence of pacemaker which was incompatible. CT myelogram done. Neurology, neurosurgery, neurosurgery, oncology, radiation oncology consulted. Back pain/lower extremity weakness suspected to be from metastatic bladder cancer. Patient underwent cystoscopy with TURBT at [facility].   Subjective / 24h Interval events: Complains of low back pain radiating into her legs, appreciates her weakness in her legs is getting a little bit better  Assesement and Plan: Principal problem Bilateral Lower Extremity Weakness, back pain, thoracic and lumbar Compression Fractures -neurology, neurosurgery, oncology consulted.  Dr. Otelia Limes evaluated patient, suspicion was that this may be related to gabapentin toxicity, recommending to discontinue or not resuming as an outpatient.  MRI could not be obtained due to presence of pacemaker and she underwent a myelogram, Dr. Franky Macho with neurosurgery evaluated patient and did not appreciate any compressive lesion other than L4-L5 displaced disc with low suspicion for compression lesions.  Imaging also showed scattered sclerotic axial skeletal lesions suspicious for mets.  After discussing at tumor board, patient's current symptoms are unlikely to be related to spinal cord compression from metastatic disease, and there is no clear role for radiation therapy.  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 Path notable for high grade urothelial carcinoma showing squamous differentiation. S/p R nephrostomy tube 3/23 due to hydronephrosis -Oncology consulted and following, awaiting pathology from T7 to determine staging   AKI on CKD IIIb - R severe hydroureteronephrosis. S/p R PCN by IR on 3/23.  Baseline creatinine around 1.7, was as high as 2.8 on 3/21   Chronic Diastolic Heart Failure -continue furosemide, appears euvolemic   Chronic Pain - Oxycodone, dilaudid prn  COPD - Not wheezing, respiratory status appears stable  Hyperlipidemia-continue lipid-lowering medications  Leukocytosis  - Monitor, due to steroids  Obesity, class I - BMI 33.  Scheduled Meds:  ARIPiprazole  5 mg Oral Daily   Buprenorphine HCl  900 mcg Buccal Q12H   Chlorhexidine Gluconate Cloth  6 each Topical Daily   dexamethasone (DECADRON) injection  6 mg Intravenous Q24H   fluticasone furoate-vilanterol  1 puff Inhalation Daily   furosemide  40 mg Oral Daily   lidocaine  1 patch Transdermal Q24H   pantoprazole  40 mg Oral Daily   polyethylene glycol  17 g Oral Daily   ranolazine  500 mg Oral BID   rosuvastatin  20 mg Oral Daily   senna-docusate  2 tablet Oral BID   sertraline  50 mg Oral Daily   sodium bicarbonate  650 mg Oral BID   sodium chloride flush  3 mL Intravenous Q12H   sodium chloride flush  3-10 mL Intravenous Q12H   Continuous Infusions: PRN Meds:.acetaminophen **OR** acetaminophen, fluticasone, HYDROmorphone (DILAUDID) injection, ipratropium-albuterol, naLOXone (NARCAN)  injection, oxyCODONE  Current Outpatient Medications  Medication  Instructions   ARIPiprazole (ABILIFY) 5 mg, Oral, Daily   aspirin 81 mg, Daily   Belbuca 900 mcg, Every 12 hours   BREO  ELLIPTA 100-25 MCG/ACT AEPB 1 puff, Inhalation, Daily   carvedilol (COREG) 3.125 MG tablet TAKE ONE (1) TABLET BY MOUTH TWICE DAILY WITH MEALS   cefdinir (OMNICEF) 300 mg, 2 times daily   denosumab (PROLIA) 60 mg, Subcutaneous, Every 6 months   dextromethorphan 30 mg, Oral, 4 times daily PRN   diclofenac Sodium (VOLTAREN) 2 g, Topical, 4 times daily   Emgality 120 mg, Subcutaneous, Every 30 days   EPINEPHrine (EPI-PEN) 0.3 mg, Intramuscular, As needed   esomeprazole (NEXIUM) 20 MG capsule TAKE ONE (1) CAPSULE BY MOUTH ONCE DAILY   Farxiga 5 mg, Daily   fluticasone (FLONASE) 50 MCG/ACT nasal spray USE 2 SPRAYS IN EACH NOSTRILS DAILY AS NEEDED   furosemide (LASIX) 40 mg, Oral, Daily   gabapentin (NEURONTIN) 800 mg, Oral, 3 times daily, Patient usually takes twice a day   guaiFENesin-codeine 100-10 MG/5ML syrup 5 mLs, Oral, 3 times daily PRN   ipratropium-albuterol (DUONEB) 0.5-2.5 (3) MG/3ML SOLN USE 1 VIAL VIA NEBULIZER EVERY 6 HOURS AS NEEDED FOR SHORTNESS OF BREATH   levocetirizine (XYZAL) 5 mg, Daily PRN   lubiprostone (AMITIZA) 24 MCG capsule TAKE 1 CAPSULE(24 MCG) BY MOUTH TWICE DAILY WITH A MEAL   megestrol (MEGACE) 20 mg, Oral, Daily   Multiple Vitamin (MULTIVITAMIN WITH MINERALS) TABS tablet 1 tablet, Oral, Daily   naloxone (NARCAN) nasal spray 4 mg/0.1 mL 1 spray,  Once   nitrofurantoin (macrocrystal-monohydrate) (MACROBID) 100 mg, 2 times daily   nitroGLYCERIN (NITROSTAT) 0.4 mg, Sublingual, Every 5 min PRN   ondansetron (ZOFRAN) 4 mg, Oral, Every 8 hours PRN   oxyCODONE (ROXICODONE) 15 mg, Every 8 hours PRN   potassium chloride (KLOR-CON) 10 MEQ tablet 30 mEq, Oral, Daily   ranolazine (RANEXA) 500 mg, Oral, 2 times daily   rosuvastatin (CRESTOR) 20 mg, Oral, Daily   sacubitril-valsartan (ENTRESTO) 24-26 MG TAKE ONE (1) TABLET BY MOUTH TWICE DAILY   sertraline (ZOLOFT) 100 MG tablet TAKE 1/2 TABLET(50 MG) BY MOUTH DAILY   tiZANidine (ZANAFLEX) 4 mg, Daily at bedtime    topiramate (TOPAMAX) 50 MG tablet TAKE TWO (2) TABLETS BY MOUTH EVERY DAY AT BEDTIME   Ubrelvy 100 mg, Oral, As needed, TAKE 1 TABLET BY MOUTH BY MOUTH AS NEEDED( MAY REPEAT 1 TABLET AFTER 2 HOURS IF NEEDED, MAXIMUM 2 TABLETS IN 24 HOURS) Strength: 100 mg   Vitamin D, Ergocalciferol, (DRISDOL) 1.25 MG (50000 UNIT) CAPS capsule TAKE 1 CAPSULE BY MOUTH EVERY 7 DAYS FOR 12 DOSES    Diet Orders (From admission, onward)     Start     Ordered   11/21/23 0001  Diet NPO time specified Except for: Sips with Meds  Diet effective midnight       Comments:    Question:  Except for  Answer:  Sips with Meds   11/20/23 1655            DVT prophylaxis: SCDs Start: 11/14/23 2033   Lab Results  Component Value Date   PLT 268 11/21/2023      Code Status: Full Code  Family Communication: no family at bedside  Status is: Inpatient Remains inpatient appropriate because: severity of illness  Level of care: Med-Surg  Consultants:  Oncology Radiation oncology IR Neurology Neurosurgery  Objective: Vitals:   11/20/23 0435 11/20/23 1351 11/20/23 2041 11/21/23 0434  BP: 120/79 125/69  115/60 115/76  Pulse: 70 86 84 67  Resp: 16 18 15 16   Temp: 98.2 F (36.8 C) 98.2 F (36.8 C) 98.4 F (36.9 C) 98.8 F (37.1 C)  TempSrc: Oral Oral Oral Oral  SpO2: 99% 98% 97% 97%  Weight:      Height:        Intake/Output Summary (Last 24 hours) at 11/21/2023 1015 Last data filed at 11/21/2023 0445 Gross per 24 hour  Intake 130 ml  Output 1265 ml  Net -1135 ml   Wt Readings from Last 3 Encounters:  11/16/23 73.2 kg  11/05/23 66.9 kg  10/20/23 69 kg    Examination:  Constitutional: NAD Eyes: no scleral icterus ENMT: Mucous membranes are moist.  Neck: normal, supple Respiratory: clear to auscultation bilaterally, no wheezing, no crackles.  Cardiovascular: Regular rate and rhythm, no murmurs / rubs / gallops. No LE edema.  Abdomen: non distended, no tenderness. Bowel sounds positive.   Musculoskeletal: no clubbing / cyanosis.   Data Reviewed: I have independently reviewed following labs and imaging studies   CBC Recent Labs  Lab 11/14/23 1649 11/15/23 0320 11/17/23 0350 11/19/23 0413 11/21/23 0341  WBC 12.4* 11.8* 14.7* 10.7* 10.9*  HGB 13.2 11.5* 10.5* 10.2* 10.6*  HCT 41.9 35.7* 33.5* 33.7* 33.3*  PLT 361 331 320 287 268  MCV 91.9 90.4 92.8 98.0 92.2  MCH 28.9 29.1 29.1 29.7 29.4  MCHC 31.5 32.2 31.3 30.3 31.8  RDW 14.3 14.3 14.3 14.5 13.9  LYMPHSABS 1.1  --   --  1.4 1.1  MONOABS 0.4  --   --  0.9 0.5  EOSABS 0.0  --   --  0.0 0.0  BASOSABS 0.1  --   --  0.1 0.1    Recent Labs  Lab 11/14/23 1649 11/15/23 0320 11/16/23 0624 11/17/23 0350 11/18/23 1331 11/19/23 0413 11/20/23 0413 11/21/23 0341  NA 135 136   < > 140 140 140 137 136  K 4.5 4.1   < > 4.5 4.4 4.6 4.3 4.3  CL 101 106   < > 112* 112* 114* 110 106  CO2 19* 21*   < > 19* 20* 19* 18* 21*  GLUCOSE 147* 125*   < > 96 90 124* 115* 127*  BUN 83* 79*   < > 54* 45* 38* 26* 26*  CREATININE 2.79* 2.49*   < > 1.80* 1.83* 1.57* 1.31* 1.39*  CALCIUM 9.5 8.6*   < > 8.4* 8.5* 8.4* 8.5* 8.5*  AST 24  --   --   --   --   --   --  14*  ALT 15  --   --   --   --   --   --  10  ALKPHOS 72  --   --   --   --   --   --  81  BILITOT 0.5  --   --   --   --   --   --  0.3  ALBUMIN 3.6  --   --   --   --   --   --  2.5*  MG  --   --   --   --   --   --   --  1.6*  INR  --  1.1  --   --   --   --   --   --    < > = values in this interval not displayed.    ------------------------------------------------------------------------------------------------------------------ No  results for input(s): "CHOL", "HDL", "LDLCALC", "TRIG", "CHOLHDL", "LDLDIRECT" in the last 72 hours.  Lab Results  Component Value Date   HGBA1C 5.5 06/23/2023   ------------------------------------------------------------------------------------------------------------------ No results for input(s): "TSH", "T4TOTAL", "T3FREE",  "THYROIDAB" in the last 72 hours.  Invalid input(s): "FREET3"  Cardiac Enzymes No results for input(s): "CKMB", "TROPONINI", "MYOGLOBIN" in the last 168 hours.  Invalid input(s): "CK" ------------------------------------------------------------------------------------------------------------------    Component Value Date/Time   BNP 193.4 (H) 10/18/2020 0252    CBG: No results for input(s): "GLUCAP" in the last 168 hours.  No results found for this or any previous visit (from the past 240 hours).   Radiology Studies: No results found.   Pamella Pert, MD, PhD [facility]  Between 7 am - 7 pm I am available, please contact me via Amion (for emergencies) or Securechat (non urgent messages)  Between 7 pm - 7 am I am not available, please contact night coverage MD/APP via Amion

## 2023-11-21 NOTE — Progress Notes (Signed)
 Patient ID: [name], female   DOB: 07-09-1956, 68 y.o.   MRN: [id number]    Referring Physician(s): Winter,C/ Chang,R   Supervising Physician: Malachy Moan  Patient Status:  [facility] - In-pt  Chief Complaint:  Low back pain, metastatic bladder cancer/hydronephrosis; s/p right nephrostomy 11/16/23, T7 biopsy planned today 11/21/23  Subjective:  Pt day 5 post op from right PCN placement. Now planning T7 biopsy today 11/21/23 for lesion seen on imaging in the setting of metastatic bladder cancer. Sitting up in bed with no new complaints. Still some lower back soreness. No thoracic pain. Denies thoracic spine pain, fever, HA, CP, dyspnea, cough, abd pain, N/V   Allergies: Patient has no known allergies.  Medications: Prior to Admission medications   Medication Sig Start Date End Date Taking? Authorizing Provider  ARIPiprazole (ABILIFY) 5 MG tablet TAKE 1 TABLET BY MOUTH DAILY 10/07/23  Yes Cox, Kirsten, MD  aspirin 81 MG tablet Take 81 mg by mouth daily.   Yes [provider]  BREO ELLIPTA 100-25 MCG/ACT AEPB INHALE 1 PUFF INTO THE LUNGS DAILY. Patient taking differently: Inhale 1 puff into the lungs daily as needed. 06/03/23  Yes Craft, Huston Foley, PA  Buprenorphine HCl (BELBUCA) 900 MCG FILM Place 900 mcg inside cheek every 12 (twelve) hours.   Yes [provider]  carvedilol (COREG) 3.125 MG tablet TAKE ONE (1) TABLET BY MOUTH TWICE DAILY WITH MEALS Patient taking differently: Take 3.125 mg by mouth 2 (two) times daily with a meal. 06/02/23  Yes Cox, Kirsten, MD  cefdinir (OMNICEF) 300 MG capsule Take 300 mg by mouth 2 (two) times daily. 11/10/23  Yes [provider]  denosumab (PROLIA) 60 MG/ML SOSY injection Inject 60 mg into the skin every 6 (six) months. 02/14/20  Yes Abigail Miyamoto, MD  diclofenac Sodium (VOLTAREN) 1 % GEL Apply 2 g topically 4 (four) times daily. Patient taking differently: Apply 2 g topically as needed (Pain). 05/01/21  Yes Abigail Miyamoto, MD  EPINEPHRINE 0.3 mg/0.3 mL IJ SOAJ injection Inject 0.3 mLs (0.3 mg total) into the muscle as needed for anaphylaxis. 04/27/20  Yes Abigail Miyamoto, MD  esomeprazole (NEXIUM) 20 MG capsule TAKE ONE (1) CAPSULE BY MOUTH ONCE DAILY 08/28/23  Yes Cox, Kirsten, MD  FARXIGA 5 MG TABS tablet Take 5 mg by mouth daily. 10/10/23  Yes [provider]  fluticasone (FLONASE) 50 MCG/ACT nasal spray USE 2 SPRAYS IN EACH NOSTRILS DAILY AS NEEDED Patient taking differently: Place 2 sprays into both nostrils daily as needed for allergies or rhinitis. 07/18/23  Yes [name], FNP  furosemide (LASIX) 40 MG tablet Take 1 tablet (40 mg total) by mouth daily. 06/25/23 11/14/23 Yes Georgeanna Lea, MD  gabapentin (NEURONTIN) 800 MG tablet Take 1 tablet (800 mg total) by mouth 3 (three) times daily. Patient usually takes twice a day 01/21/20  Yes Abigail Miyamoto, MD  Galcanezumab-gnlm [facility]) 120 MG/ML SOSY Inject 120 mg into the skin every 30 (thirty) days. 01/06/23  Yes Cox, Kirsten, MD  ipratropium-albuterol (DUONEB) 0.5-2.5 (3) MG/3ML SOLN USE 1 VIAL VIA NEBULIZER EVERY 6 HOURS AS NEEDED FOR SHORTNESS OF BREATH 10/16/23  Yes [name], FNP  levocetirizine (XYZAL) 5 MG tablet Take 5 mg by mouth daily as needed for allergies. 12/13/19  Yes [provider]  lubiprostone (AMITIZA) 24 MCG capsule TAKE 1 CAPSULE(24 MCG) BY MOUTH TWICE DAILY WITH A MEAL 09/15/23  Yes Cox, Kirsten, MD  megestrol (MEGACE) 20 MG tablet  Take 1 tablet (20 mg total) by mouth daily. 06/19/23  Yes Cox, Kirsten, MD  Multiple Vitamin (MULTIVITAMIN WITH MINERALS) TABS tablet Take 1 tablet by mouth daily. 10/30/20  Yes Rodolph Bong, MD  naloxone [facility]) nasal spray 4 mg/0.1 mL Place 1 spray into the nose once. 01/12/21  Yes [provider]  nitrofurantoin, macrocrystal-monohydrate, (MACROBID) 100 MG capsule Take 100 mg by mouth 2 (two) times daily. 11/10/23  Yes [provider]   nitroGLYCERIN (NITROSTAT) 0.4 MG SL tablet Place 1 tablet (0.4 mg total) under the tongue every 5 (five) minutes as needed for chest pain. 11/27/21  Yes Georgeanna Lea, MD  oxyCODONE (ROXICODONE) 15 MG immediate release tablet Take 15 mg by mouth every 8 (eight) hours as needed for pain. 08/29/21  Yes [provider]  potassium chloride (KLOR-CON) 10 MEQ tablet Take 3 tablets (30 mEq total) by mouth daily for 7 days. 10/29/23 11/14/23 Yes Camnitz, Will Daphine Deutscher, MD  ranolazine (RANEXA) 500 MG 12 hr tablet TAKE 1 TABLET BY MOUTH TWICE DAILY 10/28/23  Yes Georgeanna Lea, MD  rosuvastatin (CRESTOR) 20 MG tablet TAKE 1 TABLET BY MOUTH ONCE DAILY 08/28/23  Yes Cox, Kirsten, MD  sacubitril-valsartan (ENTRESTO) 24-26 MG TAKE ONE (1) TABLET BY MOUTH TWICE DAILY Patient taking differently: Take 1 tablet by mouth 2 (two) times daily. 06/02/23  Yes Cox, Kirsten, MD  sertraline (ZOLOFT) 100 MG tablet TAKE 1/2 TABLET(50 MG) BY MOUTH DAILY 08/24/23  Yes Cox, Kirsten, MD  tiZANidine (ZANAFLEX) 4 MG tablet Take 4 mg by mouth at bedtime. 11/09/20  Yes [provider]  topiramate (TOPAMAX) 50 MG tablet TAKE TWO (2) TABLETS BY MOUTH EVERY DAY AT BEDTIME Patient taking differently: Take 100 mg by mouth at bedtime. TAKE TWO (2) TABLETS BY MOUTH EVERY DAY AT BEDTIME 06/02/23  Yes Cox, Kirsten, MD  Ubrogepant (UBRELVY) 100 MG TABS Take 1 tablet (100 mg total) by mouth as needed (May repeat after 2 hours.  Maximum 2 tablets in 24 hours). TAKE 1 TABLET BY MOUTH BY MOUTH AS NEEDED( MAY REPEAT 1 TABLET AFTER 2 HOURS IF NEEDED, MAXIMUM 2 TABLETS IN 24 HOURS) Strength: 100 mg 04/07/23  Yes Cox, Kirsten, MD  Vitamin D, Ergocalciferol, (DRISDOL) 1.25 MG (50000 UNIT) CAPS capsule TAKE 1 CAPSULE BY MOUTH EVERY 7 DAYS FOR 12 DOSES Patient taking differently: Take 50,000 Units by mouth every 7 (seven) days. Patient usually takes this medication on mondays 06/30/23  Yes Cox, Kirsten, MD  dextromethorphan 15 MG/5ML syrup Take  10 mLs (30 mg total) by mouth 4 (four) times daily as needed for cough. Patient not taking: Reported on 11/14/2023 10/22/23   [name], FNP  guaiFENesin-codeine 100-10 MG/5ML syrup Take 5 mLs by mouth 3 (three) times daily as needed for cough. 10/16/23   [name], FNP  ondansetron (ZOFRAN) 4 MG tablet Take 1 tablet (4 mg total) by mouth every 8 (eight) hours as needed for nausea or vomiting. 05/17/20   Abigail Miyamoto, MD     Vital Signs: BP 115/76 (BP Location: Right Arm)   Pulse 67   Temp 98.8 F (37.1 C) (Oral)   Resp 16   Ht 4\' 10"  (1.473 m)   Wt 161 lb 6 oz (73.2 kg)   SpO2 97%   BMI 33.73 kg/m   Physical Exam Vitals and nursing note reviewed.  Constitutional:      Appearance: Normal appearance. She is not ill-appearing or toxic-appearing.  HENT:  Mouth/Throat:     Mouth: Mucous membranes are moist.     Pharynx: Oropharynx is clear.  Cardiovascular:     Rate and Rhythm: Normal rate and regular rhythm.  Pulmonary:     Effort: Pulmonary effort is normal.     Breath sounds: Normal breath sounds.  Abdominal:     Palpations: Abdomen is soft.     Tenderness: There is no abdominal tenderness.  Genitourinary:    Comments: Right PCN appears well. 75ml clear urine noted in bag at bedside. No external abnormality. PCN flushed with 5cc saline at bedside without difficulty. Musculoskeletal:     Right lower leg: No edema.     Left lower leg: No edema.  Skin:    General: Skin is warm and dry.  Neurological:     General: No focal deficit present.     Mental Status: She is alert and oriented to person, place, and time. Mental status is at baseline.     Imaging: NM Bone Scan Whole Body Result Date: 11/19/2023 CLINICAL DATA:  Bladder cancer. Severe low back pain that radiates into the bilateral legs. EXAM: NUCLEAR MEDICINE WHOLE BODY BONE SCAN TECHNIQUE: Whole body anterior and posterior images were obtained approximately 3 hours after intravenous injection of  radiopharmaceutical. RADIOPHARMACEUTICALS:  19.6 mCi Technetium-20m MDP IV COMPARISON:  CT chest 10/17/2023 and CT abdomen pelvis 10/28/2023 and CT spine 11/15/2023 FINDINGS: Foci of increased uptake involving the T5 and T7 vertebral bodies are compatible with the known compression fractures. Subtle areas of increased uptake along the left side of T2 and T3 may correspond with the areas of sclerosis on the previous cross-sectional imaging. Increased uptake near T1 corresponds with a compression fracture based on the CT from 11/15/2023. Focus of increased uptake involving the lateral left second rib corresponds with an area of cortical thickening and sclerosis on the previous CT. This could represent old left second rib fracture or a sclerotic lesion. Small foci involving the right eighth rib, right seventh rib and possibly the right sixth rib could be related to prior fractures based on previous CT. At least 2 areas of increased uptake involving the left side of the scapula. Difficult to exclude small left scapular lesions. Activity in the left renal collecting system. Minimal activity in the right renal collecting system. Slightly increased uptake along the left side of the calvarium is indeterminate. Increased uptake in the lower pelvis is likely involving the urinary bladder. No suspicious uptake in the appendicular skeleton. Probable degenerative changes in both shoulders. Focus of photopenia in the left chest compatible with patient's AICD. Slightly increased uptake in the sacrum is likely related to known sacral fractures. Photopenia in the right hip compatible with a right hip replacement. IMPRESSION: 1. Scattered areas of increased uptake involving multiple thoracic vertebral bodies. Areas of increased uptake involving T1, T5 and T7 compatible with known compression fractures. Based on the CT from 11/15/2023, there is concern for a lytic lesion involving the T1 left pedicle. Concern for metastatic disease at  T1. Cannot exclude metastatic disease at T2, T3, T5 and T7. 2. Small foci in the left scapula and cannot exclude metastatic bone lesions in this area. 3. Bilateral rib foci. These could be related to old rib fractures. However, the left second rib is suspicious for metastatic disease based on the prior CT. Electronically Signed   By: Richarda Overlie M.D.   On: 11/19/2023 19:09   CT PELVIS WO CONTRAST Result Date: 11/17/2023 CLINICAL DATA:  Bladder cancer to look  for metastasis. EXAM: CT PELVIS WITHOUT CONTRAST TECHNIQUE: Multidetector CT imaging of the pelvis was performed following the standard protocol without intravenous contrast. RADIATION DOSE REDUCTION: This exam was performed according to the departmental dose-optimization program which includes automated exposure control, adjustment of the mA and/or kV according to patient size and/or use of iterative reconstruction technique. COMPARISON:  CT abdomen and pelvis 10/25/2020 FINDINGS: Urinary Tract: Foley catheter decompresses the bladder. Distal left ureter appears dilated, possibly reflux or obstruction. No stones are demonstrated. Bowel: Visualized small and large bowel are not abnormally distended. Prominent diverticulosis of the sigmoid colon. No definitive inflammatory changes to suggest acute diverticulitis. Vascular/Lymphatic: Mild calcification in the iliac arteries. No significant lymphadenopathy. Reproductive:  Uterus and ovaries are not enlarged. Other: Nonspecific soft tissue thickening in the presacral perirectal fat. This could represent a hematoma in the setting of sacral fractures. See below. Alternatively, this could represent postradiation or postsurgical change if there has been surgery or radiation to this area. Alternatively, infectious, inflammatory, or neoplastic etiologies could be considered. No discrete mass or fluid collection. Musculoskeletal: Postoperative right hip arthroplasty. Degenerative changes in the lower lumbar spine.  Nondisplaced fracture of the sacrum at the level of S2. This may represent a stress fracture or insufficiency fracture. Old appearing fracture deformity of the left inferior pubic ramus. IMPRESSION: 1. Foley catheter decompresses the bladder, limiting evaluation. 2. No lymphadenopathy or soft tissue mass identified. 3. Colonic diverticulosis without evidence of acute diverticulitis. 4. Nonspecific soft tissue thickening in the presacral space. Possibly etiologies include infectious, inflammatory, postoperative/postradiation, or posttraumatic change. 5. Nondisplaced fractures of the sacrum at the level of S2, possibly stress or insufficiency fracture. No destructive bone lesions are identified. Electronically Signed   By: Burman Nieves M.D.   On: 11/17/2023 20:23    Labs:  CBC: Recent Labs    11/15/23 0320 11/17/23 0350 11/19/23 0413 11/21/23 0341  WBC 11.8* 14.7* 10.7* 10.9*  HGB 11.5* 10.5* 10.2* 10.6*  HCT 35.7* 33.5* 33.7* 33.3*  PLT 331 320 287 268    COAGS: Recent Labs    11/15/23 0320  INR 1.1  APTT 26    BMP: Recent Labs    11/18/23 1331 11/19/23 0413 11/20/23 0413 11/21/23 0341  NA 140 140 137 136  K 4.4 4.6 4.3 4.3  CL 112* 114* 110 106  CO2 20* 19* 18* 21*  GLUCOSE 90 124* 115* 127*  BUN 45* 38* 26* 26*  CALCIUM 8.5* 8.4* 8.5* 8.5*  CREATININE 1.83* 1.57* 1.31* 1.39*  GFRNONAA 30* 35* 44* 41*    LIVER FUNCTION TESTS: Recent Labs    06/23/23 1516 09/25/23 1449 11/14/23 1649 11/21/23 0341  BILITOT 0.3 0.3 0.5 0.3  AST 13 12 24  14*  ALT 5 7 15 10   ALKPHOS 63 78 72 81  PROT 6.6 6.5 7.6 5.2*  ALBUMIN 4.2 4.3 3.6 2.5*    Assessment and Plan:  68 y/o F with hx of metastatic bladder cancer. IR initially consulted for right PCN placement. Pt is now day 5 post op from that procedure with drain doing well, no new concerns. I/O 90ml over last day, with 75ml noted in bag at bedside today. Drain flushed with 5cc saline at bedside without difficulty. IR  received additional request yesterday for potential Kyphoplasty/biopsy of suspicious lesions at T1, T5, T7. After review with Drs. Adam Phenix, plan for T7 biopsy today (3/28). No recommendation for kyphoplasty as patient is non tender and asymptomatic in the thoracic spine. Pt was consented for  T7 biopsy procedure yesterday with all risks and benefits explained. All questions answered and pt agreeable to proceed. Consent signed and in chart.     Electronically Signed: Katheren Puller, PA-C 11/21/2023, 10:17 AM   I spent a total of 15 Minutes at the the patient's bedside AND on the patient's hospital floor or unit, greater than 50% of which was counseling/coordinating care for right percutaneous nephrostomy/T7 vertebral body biopsy.

## 2023-11-21 NOTE — Progress Notes (Signed)
 Rapid Response Event Note   Reason for Call : pt pupils are unequal left 5 and right 3, both are brisk, Day RN states she didn't notice this before when looking at the pt. Last normal time 1850.  Initial Focused Assessment: Upon my assessment pt pupils are right 3 and brisk, left 4 and brisk.  Pt A/O.  Pt able to follow commands.  Pt can wiggle her toes but unable to raise her legs.  (Not new) See chart.  Area for biopsy looks intact.    Interventions: CBG 282.  Spoke with TRIAD, NP.  No orders at this time as to pt is A/O and following commands.  Pt denies dizziness, HA and/or numbness.  Smile intact. VS in flowsheet,  NIH complete (0).  Plan of Care: Pt will remain in current location. Primary RN informed to monitor pt and report any concerns.   Event Summary:   MD Notified: yes Call Time: 1936 Arrival Time: 1940 End Time: 2000  Conley Rolls, RN

## 2023-11-21 NOTE — Progress Notes (Signed)
 Physical Therapy Treatment Patient Details Name: [name] MRN: [id number] DOB: Jun 28, 1956 Today's Date: 11/21/2023   History of Present Illness 68 year old female who presented to the emergency department with complaint of back pain radiating to her hips, inability to ambulate, weakness of bilateral lower extremity, MRI could not be done because of presence of pacemaker which was incompatible. CT myelogram done. CT of the thoracolumbar spine showed redemonstration of scattered sclerotic axial skeleton lesions suspicious for mets.Also showed L3-L4, L4-L5, L5-S1 disc bulge with question at least moderate central canal stenosis at the L4-L5 level.     Neurology, neurosurgery, neurosurgery, oncology, radiation oncology and urology consulted. Back pain/lower extremity weakness suspected to be from metastatic bladder cancer. Urology perfomred cystoscopy- TURBT 11/16/23 at [facility].         PMH: COPD, bladder cancer, spinal stenosis, chronic knee pain, sHF, HTN, MDD, PTSD, incisional hernia repair    PT Comments  Pt continues to progress toward acute PT goals this session with progression to short distance ambulation.  Pt performed bed mobility and sit to stand transfers with MIN A and cues for hand placement and sequencing. Pt ambulated ~3ft (away from bed and back) with MIN A and no LOB observed- limited by pain. Pt still presenting significantly below baseline level of mobility. Continue to recommend continued  follow up therapy, <3 hours/day upon d/c.Pt will benefit from continued skilled PT to increase their independence and maximize safety with mobility.      If plan is discharge home, recommend the following: Two people to help with walking and/or transfers;Two people to help with bathing/dressing/bathroom;Help with stairs or ramp for entrance;Assistance with cooking/housework   Can travel by private vehicle     No  Equipment Recommendations  None recommended by PT (pt owns RW)     Recommendations for Other Services       Precautions / Restrictions Precautions Precautions: Fall Restrictions Weight Bearing Restrictions Per Provider Order: No     Mobility  Bed Mobility Overal bed mobility: Needs Assistance Bed Mobility: Sit to Supine, Rolling, Sidelying to Sit Rolling: Min assist, Used rails Sidelying to sit: Min assist, HOB elevated   Sit to supine: Min assist, Used rails   General bed mobility comments: HOB lowered. cues for log roll technique for comfort, assist to bring trunk to upright. Able to scoot hips to EOB incrementally with rest breaks due to pain and lightheadeness- resolved with seated rest    Transfers Overall transfer level: Needs assistance Equipment used: Rolling walker (2 wheels) Transfers: Sit to/from Stand Sit to Stand: Min assist           General transfer comment: MIN A with cues for hand placement to stand.    Ambulation/Gait Ambulation/Gait assistance: Min assist Gait Distance (Feet): 5 Feet Assistive device: Rolling walker (2 wheels) Gait Pattern/deviations: Step-to pattern, Decreased stride length, Trunk flexed, Narrow base of support Gait velocity: decreased     General Gait Details: Pt ambulated ~75ft forward and turned to go back to bed. close chair follow provided for safety. Unable to progress further distance due to increased pain in low back and pt requesting to return to bed.   Stairs             Wheelchair Mobility     Tilt Bed    Modified Rankin (Stroke Patients Only)       Balance Overall balance assessment: Needs assistance Sitting-balance support: Bilateral upper extremity supported, Feet supported Sitting balance-Leahy Scale: Fair  Standing balance support: Bilateral upper extremity supported, During functional activity, Reliant on assistive device for balance Standing balance-Leahy Scale: Poor Standing balance comment: reliant on external support                             Communication Communication Communication: No apparent difficulties  Cognition Arousal: Alert Behavior During Therapy: WFL for tasks assessed/performed   PT - Cognitive impairments: No apparent impairments                         Following commands: Intact      Cueing    Exercises General Exercises - Lower Extremity Ankle Circles/Pumps: AROM, Both, 10 reps, Supine Gluteal Sets: AROM, Both, 10 reps, Supine Heel Slides: AROM, Both, 5 reps, Supine Hip ABduction/ADduction: AROM, Right, Both, 5 reps, Supine    General Comments        Pertinent Vitals/Pain Pain Assessment Pain Assessment: Faces Faces Pain Scale: Hurts whole lot Pain Location: Lower back and LEs Pain Descriptors / Indicators: Aching, Radiating Pain Intervention(s): Limited activity within patient's tolerance, Monitored during session, Premedicated before session    Home Living                          Prior Function            PT Goals (current goals can now be found in the care plan section) Acute Rehab PT Goals Patient Stated Goal: Get back to independence and have less pain PT Goal Formulation: With patient Time For Goal Achievement: 12/01/23 Potential to Achieve Goals: Good Progress towards PT goals: Progressing toward goals    Frequency    Min 2X/week      PT Plan      Co-evaluation              AM-PAC PT "6 Clicks" Mobility   Outcome Measure  Help needed turning from your back to your side while in a flat bed without using bedrails?: A Little Help needed moving from lying on your back to sitting on the side of a flat bed without using bedrails?: A Lot Help needed moving to and from a bed to a chair (including a wheelchair)?: A Little Help needed standing up from a chair using your arms (e.g., wheelchair or bedside chair)?: A Little Help needed to walk in hospital room?: A Little Help needed climbing 3-5 steps with a railing? : Total 6 Click Score:  15    End of Session Equipment Utilized During Treatment: Gait belt Activity Tolerance: Patient limited by pain Patient left: in bed;with call bell/phone within reach;with bed alarm set (pt requesting to return to bed vs recliner chair) Nurse Communication: Mobility status;Other (comment) PT Visit Diagnosis: Other abnormalities of gait and mobility (R26.89);Muscle weakness (generalized) (M62.81)     Time: 4782-9562 PT Time Calculation (min) (ACUTE ONLY): 23 min  Charges:    $Therapeutic Activity: 23-37 mins PT General Charges $$ ACUTE PT VISIT: 1 Visit                     Lyman Speller PT, DPT  Acute Rehabilitation Services  Office [phone]  11/21/2023, 1:18 PM

## 2023-11-21 NOTE — Procedures (Signed)
 Interventional Radiology Procedure Note  Procedure: Right transverse process T5 biopsy under CT guidance.  No other sites deemed safe for bx.   Complications: None immediate  Estimated Blood Loss: None  Recommendations: - Path sent   Signed,  Sterling Big, MD

## 2023-11-22 DIAGNOSIS — G8220 Paraplegia, unspecified: Secondary | ICD-10-CM | POA: Diagnosis not present

## 2023-11-22 NOTE — Progress Notes (Addendum)
 PROGRESS NOTE  [name]:[id number] DOB: 01-24-1956 DOA: 11/14/2023 PCP: [name], FNP   LOS: 8 days   Brief Narrative / Interim history: 68 year old female with history of COPD, bladder cancer, spinal stenosis who presented to the emergency department with complaint of back pain radiating to her hips, inability to ambulate, weakness of bilateral lower extremity, lack of bowel movement. Hemodynamically stable on presentation. MRI could not be done because of presence of pacemaker which was incompatible. CT myelogram done. Neurology, neurosurgery, neurosurgery, oncology, radiation oncology consulted. Back pain/lower extremity weakness suspected to be from metastatic bladder cancer. Patient underwent cystoscopy with TURBT at [facility].   Subjective / 24h Interval events: She is doing well, overall feels better.  Has less pain in her back and has been able to do a few steps with therapy  Assesement and Plan: Principal problem Bilateral Lower Extremity Weakness, back pain, thoracic and lumbar Compression Fractures -neurology, neurosurgery, oncology consulted.  Dr. Otelia Limes evaluated patient, suspicion was that this may be related to gabapentin toxicity, recommending to discontinue or not resuming as an outpatient.  MRI could not be obtained due to presence of pacemaker and she underwent a myelogram, Dr. Franky Macho with neurosurgery evaluated patient and did not appreciate any compressive lesion other than L4-L5 displaced disc with low suspicion for compression lesions.  Imaging also showed scattered sclerotic axial skeletal lesions suspicious for mets.  After discussing at tumor board, patient's current symptoms are unlikely to be related to spinal cord compression from metastatic disease, and there is no clear role for radiation therapy.  She was started on Decadron -There is concern about her having metastatic disease, IR consulted and underwent T5 biopsy on 3/28.  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 Path notable for high grade urothelial carcinoma showing squamous differentiation. S/p R nephrostomy tube 3/23 due to hydronephrosis -Oncology and urology consulted and following, awaiting pathology from T5 to determine staging and plans for treatment.  This could be done as an outpatient   AKI on CKD IIIb - R severe hydroureteronephrosis. S/p R PCN by IR on 3/23.  Baseline creatinine around 1.7, was as high as 2.8 on 3/21.  Creatinine has now returned to baseline, most recent value 1.39   Chronic Diastolic Heart Failure -continue furosemide, appears euvolemic  Folic acid deficiency-continue supplements.  B12 borderline low  Chronic Pain - Oxycodone, dilaudid prn  COPD - Not wheezing, respiratory status appears stable  Hyperlipidemia-continue lipid-lowering medications  Leukocytosis  - Monitor, due to steroids  Obesity, class I - BMI 33.  Scheduled Meds:  ARIPiprazole  5 mg Oral Daily   Buprenorphine HCl  900 mcg Buccal Q12H   calcium-vitamin D  2 tablet Oral Q breakfast   Chlorhexidine Gluconate Cloth  6 each Topical Daily   vitamin B-12  1,000 mcg Oral Daily   dexamethasone (DECADRON) injection  6 mg Intravenous Q24H   ferrous sulfate  325 mg Oral QHS   fluticasone furoate-vilanterol  1 puff Inhalation Daily   folic acid  1 mg Oral Daily   furosemide  40 mg Oral Daily   lidocaine  1 patch Transdermal Q24H   pantoprazole  40 mg Oral Daily   polyethylene glycol  17 g Oral Daily   ranolazine  500 mg Oral BID   rosuvastatin  20 mg Oral Daily   senna-docusate  2 tablet Oral BID   sertraline  50 mg Oral Daily   sodium bicarbonate  650 mg Oral BID   sodium chloride flush  3 mL Intravenous Q12H   sodium chloride flush  3-10 mL Intravenous Q12H    Continuous Infusions: PRN Meds:.acetaminophen **OR** acetaminophen, fluticasone, HYDROmorphone (DILAUDID) injection, ipratropium-albuterol, naLOXone (NARCAN)  injection, oxyCODONE  Current Outpatient Medications  Medication Instructions   ARIPiprazole (ABILIFY) 5 mg, Oral, Daily   aspirin 81 mg, Daily   Belbuca 900 mcg, Every 12 hours   BREO ELLIPTA 100-25 MCG/ACT AEPB 1 puff, Inhalation, Daily   carvedilol (COREG) 3.125 MG tablet TAKE ONE (1) TABLET BY MOUTH TWICE DAILY WITH MEALS   cefdinir (OMNICEF) 300 mg, 2 times daily   denosumab (PROLIA) 60 mg, Subcutaneous, Every 6 months   dextromethorphan 30 mg, Oral, 4 times daily PRN   diclofenac Sodium (VOLTAREN) 2 g, Topical, 4 times daily   Emgality 120 mg, Subcutaneous, Every 30 days   EPINEPHrine (EPI-PEN) 0.3 mg, Intramuscular, As needed   esomeprazole (NEXIUM) 20 MG capsule TAKE ONE (1) CAPSULE BY MOUTH ONCE DAILY   Farxiga 5 mg, Daily   fluticasone (FLONASE) 50 MCG/ACT nasal spray USE 2 SPRAYS IN EACH NOSTRILS DAILY AS NEEDED   furosemide (LASIX) 40 mg, Oral, Daily   gabapentin (NEURONTIN) 800 mg, Oral, 3 times daily, Patient usually takes twice a day   guaiFENesin-codeine 100-10 MG/5ML syrup 5 mLs, Oral, 3 times daily PRN   ipratropium-albuterol (DUONEB) 0.5-2.5 (3) MG/3ML SOLN USE 1 VIAL VIA NEBULIZER EVERY 6 HOURS AS NEEDED FOR SHORTNESS OF BREATH   levocetirizine (XYZAL) 5 mg, Daily PRN   lubiprostone (AMITIZA) 24 MCG capsule TAKE 1 CAPSULE(24 MCG) BY MOUTH TWICE DAILY WITH A MEAL   megestrol (MEGACE) 20 mg, Oral, Daily   Multiple Vitamin (MULTIVITAMIN WITH MINERALS) TABS tablet 1 tablet, Oral, Daily   naloxone (NARCAN) nasal spray 4 mg/0.1 mL 1 spray,  Once   nitrofurantoin (macrocrystal-monohydrate) (MACROBID) 100 mg, 2 times daily   nitroGLYCERIN (NITROSTAT) 0.4 mg, Sublingual, Every 5 min PRN   ondansetron (ZOFRAN) 4 mg, Oral, Every 8 hours PRN   oxyCODONE (ROXICODONE) 15 mg, Every 8 hours PRN   potassium chloride  (KLOR-CON) 10 MEQ tablet 30 mEq, Oral, Daily   ranolazine (RANEXA) 500 mg, Oral, 2 times daily   rosuvastatin (CRESTOR) 20 mg, Oral, Daily   sacubitril-valsartan (ENTRESTO) 24-26 MG TAKE ONE (1) TABLET BY MOUTH TWICE DAILY   sertraline (ZOLOFT) 100 MG tablet TAKE 1/2 TABLET(50 MG) BY MOUTH DAILY   tiZANidine (ZANAFLEX) 4 mg, Daily at bedtime   topiramate (TOPAMAX) 50 MG tablet TAKE TWO (2) TABLETS BY MOUTH EVERY DAY AT BEDTIME   Ubrelvy 100 mg, Oral, As needed, TAKE 1 TABLET BY MOUTH BY MOUTH AS NEEDED( MAY REPEAT 1 TABLET AFTER 2 HOURS IF NEEDED, MAXIMUM 2 TABLETS IN 24 HOURS) Strength: 100 mg   Vitamin D, Ergocalciferol, (DRISDOL) 1.25 MG (50000 UNIT) CAPS capsule TAKE 1 CAPSULE BY MOUTH EVERY 7 DAYS FOR 12 DOSES    Diet Orders (From admission, onward)     Start     Ordered   11/21/23 1548  Diet regular Room service appropriate? Yes; Fluid consistency: Thin  Diet effective now       Question Answer Comment  Room service appropriate? Yes   Fluid consistency: Thin      11/21/23 1547            DVT prophylaxis:  SCDs Start: 11/14/23 2033   Lab Results  Component Value Date   PLT 268 11/21/2023      Code Status: Full Code  Family Communication: no family at bedside  Status is: Inpatient Remains inpatient appropriate because: severity of illness  Level of care: Med-Surg  Consultants:  Oncology Radiation oncology IR Neurology Neurosurgery  Objective: Vitals:   11/21/23 1306 11/21/23 1921 11/21/23 1955 11/22/23 0435  BP: (!) 101/59 111/73 106/62 114/66  Pulse: 70 82 77 70  Resp:    16  Temp: 98.5 F (36.9 C)   98.4 F (36.9 C)  TempSrc: Oral  Oral Oral  SpO2: 100% 99% 98% 97%  Weight:      Height:        Intake/Output Summary (Last 24 hours) at 11/22/2023 1118 Last data filed at 11/22/2023 0900 Gross per 24 hour  Intake 720 ml  Output 1760 ml  Net -1040 ml   Wt Readings from Last 3 Encounters:  11/16/23 73.2 kg  11/05/23 66.9 kg  10/20/23 69 kg     Examination:  Constitutional: NAD Eyes: lids and conjunctivae normal, no scleral icterus ENMT: mmm Neck: normal, supple Respiratory: clear to auscultation bilaterally, no wheezing, no crackles.  Cardiovascular: Regular rate and rhythm, no murmurs / rubs / gallops. No LE edema. Abdomen: soft, no distention, no tenderness. Bowel sounds positive.   Data Reviewed: I have independently reviewed following labs and imaging studies   CBC Recent Labs  Lab 11/17/23 0350 11/19/23 0413 11/21/23 0341  WBC 14.7* 10.7* 10.9*  HGB 10.5* 10.2* 10.6*  HCT 33.5* 33.7* 33.3*  PLT 320 287 268  MCV 92.8 98.0 92.2  MCH 29.1 29.7 29.4  MCHC 31.3 30.3 31.8  RDW 14.3 14.5 13.9  LYMPHSABS  --  1.4 1.1  MONOABS  --  0.9 0.5  EOSABS  --  0.0 0.0  BASOSABS  --  0.1 0.1    Recent Labs  Lab 11/17/23 0350 11/18/23 1331 11/19/23 0413 11/20/23 0413 11/21/23 0341  NA 140 140 140 137 136  K 4.5 4.4 4.6 4.3 4.3  CL 112* 112* 114* 110 106  CO2 19* 20* 19* 18* 21*  GLUCOSE 96 90 124* 115* 127*  BUN 54* 45* 38* 26* 26*  CREATININE 1.80* 1.83* 1.57* 1.31* 1.39*  CALCIUM 8.4* 8.5* 8.4* 8.5* 8.5*  AST  --   --   --   --  14*  ALT  --   --   --   --  10  ALKPHOS  --   --   --   --  81  BILITOT  --   --   --   --  0.3  ALBUMIN  --   --   --   --  2.5*  MG  --   --   --   --  1.6*    ------------------------------------------------------------------------------------------------------------------ No results for input(s): "CHOL", "HDL", "LDLCALC", "TRIG", "CHOLHDL", "LDLDIRECT" in the last 72 hours.  Lab Results  Component Value Date   HGBA1C 5.5 06/23/2023   ------------------------------------------------------------------------------------------------------------------ No results for input(s): "TSH", "T4TOTAL", "T3FREE", "THYROIDAB" in the last 72 hours.  Invalid input(s): "FREET3"  Cardiac Enzymes No results for input(s): "CKMB", "TROPONINI", "MYOGLOBIN" in the last 168  hours.  Invalid input(s): "CK" ------------------------------------------------------------------------------------------------------------------    Component Value Date/Time   BNP 193.4 (H) 10/18/2020 0252    CBG: Recent Labs  Lab 11/21/23 1941  GLUCAP 282*    No results found for this or any previous visit (  from the past 240 hours).   Radiology Studies: CT BONE TROCAR/NEEDLE BIOPSY DEEP Result Date: 11/21/2023 INDICATION: Newly diagnosed bladder cancer with multifocal osseous metastatic disease. Patient presents for attempted CT-guided biopsy of the right transverse process of T5. EXAM: CT-guided deep bone biopsy TECHNIQUE: Multidetector CT imaging of the thoracic spine was performed following the standard protocol without IV contrast. RADIATION DOSE REDUCTION: This exam was performed according to the departmental dose-optimization program which includes automated exposure control, adjustment of the mA and/or kV according to patient size and/or use of iterative reconstruction technique. MEDICATIONS: None. ANESTHESIA/SEDATION: Moderate (conscious) sedation was employed during this procedure. A total of Versed 2 mg and Fentanyl 100 mcg was administered intravenously by the radiology nurse. Total intra-service moderate Sedation Time: 18 minutes. The patient's level of consciousness and vital signs were monitored continuously by radiology nursing throughout the procedure under my direct supervision. COMPLICATIONS: None immediate. PROCEDURE: Informed written consent was obtained from the patient after a thorough discussion of the procedural risks, benefits and alternatives. All questions were addressed. Maximal Sterile Barrier Technique was utilized including caps, mask, sterile gowns, sterile gloves, sterile drape, hand hygiene and skin antiseptic. A timeout was performed prior to the initiation of the procedure. Axial CT imaging was performed. The irregular month eaten appearance of the transverse  process on the right at T5 was successfully identified. A suitable skin entry site was selected and marked. Local anesthesia was attained by infiltration with 1% lidocaine. A small dermatotomy was made. Under careful intermittent CT guidance, the 11 gauge osteo introducer was advanced and positioned against the tip of the spinous process. Several 9 gauge core biopsies were then obtained coaxially through the abnormal appearing transverse process. Biopsy specimens were placed in formalin and delivered to pathology for further analysis. The patient tolerated the procedure well. Post CT imaging demonstrates no evidence of immediate complication. IMPRESSION: Technically successful CT-guided core biopsy of the right transverse process at T5. Electronically Signed   By: Malachy Moan M.D.   On: 11/21/2023 14:28     Pamella Pert, MD, PhD [facility]  Between 7 am - 7 pm I am available, please contact me via Amion (for emergencies) or Securechat (non urgent messages)  Between 7 pm - 7 am I am not available, please contact night coverage MD/APP via Amion

## 2023-11-22 NOTE — Plan of Care (Signed)
  Problem: Health Behavior/Discharge Planning: Goal: Ability to manage health-related needs will improve Outcome: Progressing   Problem: Activity: Goal: Risk for activity intolerance will decrease Outcome: Progressing   Problem: Nutrition: Goal: Adequate nutrition will be maintained Outcome: Progressing   Problem: Elimination: Goal: Will not experience complications related to bowel motility Outcome: Progressing Goal: Will not experience complications related to urinary retention Outcome: Progressing   

## 2023-11-23 DIAGNOSIS — G8220 Paraplegia, unspecified: Secondary | ICD-10-CM | POA: Diagnosis not present

## 2023-11-23 LAB — COMPREHENSIVE METABOLIC PANEL WITH GFR
ALT: 12 U/L (ref 0–44)
AST: 18 U/L (ref 15–41)
Albumin: 2.5 g/dL — ABNORMAL LOW (ref 3.5–5.0)
Alkaline Phosphatase: 90 U/L (ref 38–126)
Anion gap: 8 (ref 5–15)
BUN: 21 mg/dL (ref 8–23)
CO2: 25 mmol/L (ref 22–32)
Calcium: 8.4 mg/dL — ABNORMAL LOW (ref 8.9–10.3)
Chloride: 105 mmol/L (ref 98–111)
Creatinine, Ser: 1.38 mg/dL — ABNORMAL HIGH (ref 0.44–1.00)
GFR, Estimated: 41 mL/min — ABNORMAL LOW (ref >60)
Glucose, Bld: 121 mg/dL — ABNORMAL HIGH (ref 70–99)
Potassium: 4.1 mmol/L (ref 3.5–5.1)
Sodium: 138 mmol/L (ref 135–145)
Total Bilirubin: 0.4 mg/dL (ref 0.0–1.2)
Total Protein: 4.8 g/dL — ABNORMAL LOW (ref 6.5–8.1)

## 2023-11-23 LAB — CBC
HCT: 31.9 % — ABNORMAL LOW (ref 36.0–46.0)
Hemoglobin: 10.1 g/dL — ABNORMAL LOW (ref 12.0–15.0)
MCH: 29.3 pg (ref 26.0–34.0)
MCHC: 31.7 g/dL (ref 30.0–36.0)
MCV: 92.5 fL (ref 80.0–100.0)
Platelets: 248 10*3/uL (ref 150–400)
RBC: 3.45 MIL/uL — ABNORMAL LOW (ref 3.87–5.11)
RDW: 14.1 % (ref 11.5–15.5)
WBC: 14.7 10*3/uL — ABNORMAL HIGH (ref 4.0–10.5)
nRBC: 0 % (ref 0.0–0.2)

## 2023-11-23 LAB — MAGNESIUM: Magnesium: 1.7 mg/dL (ref 1.7–2.4)

## 2023-11-23 MED ORDER — LOPERAMIDE HCL 2 MG PO CAPS
2.0000 mg | ORAL_CAPSULE | ORAL | Status: DC | PRN
  Administered 2023-11-23: 2 mg via ORAL
  Filled 2023-11-23: qty 1

## 2023-11-23 NOTE — Progress Notes (Signed)
 Occupational Therapy Treatment Patient Details Name: [name] MRN: [id number] DOB: 19-Feb-1956 Today's Date: 11/23/2023   History of present illness 69 year old female who presented to the emergency department with complaint of back pain radiating to her hips, inability to ambulate, weakness of bilateral lower extremity, MRI could not be done because of presence of pacemaker which was incompatible. CT myelogram done. CT of the thoracolumbar spine showed redemonstration of scattered sclerotic axial skeleton lesions suspicious for mets.Also showed L3-L4, L4-L5, L5-S1 disc bulge with question at least moderate central canal stenosis at the L4-L5 level.     Neurology, neurosurgery,  oncology, radiation oncology and urology consulted. Back pain/lower extremity weakness suspected to be from metastatic bladder cancer. Urology performed cystoscopy- TURBT 11/16/23 at [facility].  PMH: COPD, bladder cancer, spinal stenosis, chronic knee pain, sHF, HTN, MDD, PTSD, incisional hernia repair   OT comments  Patient was noted to have had incontinence of BM with attempts at getting to EOB with increased time to attempt hygiene tasks. Patient transitioned to [facility] with encouragement on strategies to help with voiding. Patient's nurse was made aware of looseness of bowels and concerns over patients foley hygiene upon settled into recliner. Nurse to address this issue. Patient was able to stand twice for over a minute for hygiene tasks patient was unable to take hand off walker with need for BUE support to maintain standing on this date. Patient will benefit from continued inpatient follow up therapy, <3 hours/day.       If plan is discharge home, recommend the following:  A lot of help with walking and/or transfers;Two people to help with walking and/or transfers;Assistance with cooking/housework;Assist for transportation;Help with stairs or ramp for entrance   Equipment Recommendations  None recommended by OT        Precautions / Restrictions Precautions Precautions: Back;Fall Precaution/Restrictions Comments: spinal for comfort with history Restrictions Weight Bearing Restrictions Per Provider Order: No       Mobility Bed Mobility Overal bed mobility: Needs Assistance Bed Mobility: Rolling, Sidelying to Sit Rolling: Min assist, Used rails Sidelying to sit: Min assist, HOB elevated       General bed mobility comments: cues for log roll technique, assist for trunk, pt utilized bed rail to self assist         Balance Overall balance assessment: Needs assistance Sitting-balance support: Bilateral upper extremity supported, Feet supported Sitting balance-Leahy Scale: Fair     Standing balance support: Bilateral upper extremity supported, During functional activity, Reliant on assistive device for balance Standing balance-Leahy Scale: Poor Standing balance comment: reliant on external support           ADL either performed or assessed with clinical judgement   ADL Overall ADL's : Needs assistance/impaired                         Toilet Transfer: Minimal assistance;+2 for safety/equipment;Rolling walker (2 wheels);Stand-pivot Statistician Details (indicate cue type and reason): to [facility] adn to reclienr Toileting- Architect and Hygiene: Sit to/from stand;Maximal assistance Toileting - Clothing Manipulation Details (indicate cue type and reason): patient declined to attempt to help with hygiene reporting she needed to hold to the walker with BUE. patietn noted to have large ammounts of loose stool on mat when standing and when attempting hygiene. nurse made aware of patients continued loose BM and inability to control voiding. nurse to consult MD             Communication Communication  Communication: No apparent difficulties   Cognition Arousal: Alert Behavior During Therapy: WFL for tasks assessed/performed, Flat affect Cognition: No apparent  impairments                               Following commands: Intact                      Pertinent Vitals/ Pain       Pain Assessment Pain Assessment: Faces Faces Pain Scale: Hurts even more Pain Location: Lower back and LEs with movement Pain Descriptors / Indicators: Aching, Radiating Pain Intervention(s): Repositioned, Monitored during session         Frequency  Min 1X/week        Progress Toward Goals  OT Goals(current goals can now be found in the care plan section)  Progress towards OT goals: Progressing toward goals     Plan      Co-evaluation      Reason for Co-Treatment: To address functional/ADL transfers PT goals addressed during session: Mobility/safety with mobility OT goals addressed during session: ADL's and self-care      AM-PAC OT "6 Clicks" Daily Activity     Outcome Measure   Help from another person eating meals?: None Help from another person taking care of personal grooming?: A Little Help from another person toileting, which includes using toliet, bedpan, or urinal?: Total Help from another person bathing (including washing, rinsing, drying)?: Total Help from another person to put on and taking off regular upper body clothing?: A Lot Help from another person to put on and taking off regular lower body clothing?: Total 6 Click Score: 12    End of Session Equipment Utilized During Treatment: Gait belt;Rolling walker (2 wheels);Other (comment) (BSC)  OT Visit Diagnosis: Muscle weakness (generalized) (M62.81);Pain;Other abnormalities of gait and mobility (R26.89)   Activity Tolerance Patient limited by pain   Patient Left with call bell/phone within reach;in chair;with chair alarm set   Nurse Communication Mobility status;Other (comment) (concerns over stool)        Time: 9528-4132 OT Time Calculation (min): 23 min  Charges: OT General Charges $OT Visit: 1 Visit OT Treatments $Self Care/Home Management :  8-22 mins  Rosalio Loud, MS Acute Rehabilitation Department Office# [phone]   Selinda Flavin 11/23/2023, 3:34 PM

## 2023-11-23 NOTE — Progress Notes (Signed)
 PROGRESS NOTE  [name] MVH:[id number] DOB: Nov 05, 1955 DOA: 11/14/2023 PCP: [name], FNP   LOS: 9 days   Brief Narrative / Interim history: 68 year old female with history of COPD, bladder cancer, spinal stenosis who presented to the emergency department with complaint of back pain radiating to her hips, inability to ambulate, weakness of bilateral lower extremity, lack of bowel movement. Hemodynamically stable on presentation. MRI could not be done because of presence of pacemaker which was incompatible. CT myelogram done. Neurology, neurosurgery, neurosurgery, oncology, radiation oncology consulted. Back pain/lower extremity weakness suspected to be from metastatic bladder cancer. Patient underwent cystoscopy with [facility] at [facility].   Subjective / 24h Interval events: Feels better, stronger.  Daughter is at bedside.  No chest discomfort, shortness of breath, no abdominal pain, no nausea or vomiting  Assesement and Plan: Principal problem Bilateral Lower Extremity Weakness, back pain, thoracic and lumbar Compression Fractures -neurology, neurosurgery, oncology consulted.  Dr. Otelia Limes evaluated patient, suspicion was that this may be related to gabapentin toxicity, recommending to discontinue or not resuming as an outpatient.  MRI could not be obtained due to presence of pacemaker and she underwent a myelogram, Dr. Franky Macho with neurosurgery evaluated patient and did not appreciate any compressive lesion other than L4-L5 displaced disc with low suspicion for compression lesions.  Imaging also showed scattered sclerotic axial skeletal lesions suspicious for mets.  After discussing at tumor board, patient's current symptoms are unlikely to be related to spinal cord compression from metastatic disease, and there is no clear role for radiation therapy.  She was started on Decadron -There is concern about her having metastatic disease, IR consulted and underwent T5 biopsy on 3/28.  Biopsy  results are pending.  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 Path notable for high grade urothelial carcinoma showing squamous differentiation. S/p R nephrostomy tube 3/23 due to hydronephrosis -Oncology and urology consulted and following, awaiting pathology from T5 to determine staging and plans for treatment.  This could be done as an outpatient   AKI on CKD IIIb - R severe hydroureteronephrosis. S/p R PCN by IR on 3/23.  Baseline creatinine around 1.7, was as high as 2.8 on 3/21.  Creatinine has remained stable  Chronic Diastolic Heart Failure -continue furosemide, appears euvolemic  Folic acid deficiency-continue supplements.  B12 borderline low  Chronic Pain - Oxycodone, dilaudid prn  COPD - Not wheezing, respiratory status appears stable  Hyperlipidemia-continue lipid-lowering medications  Leukocytosis  - Monitor, due to steroids  Obesity, class I - BMI 33.  Scheduled Meds:  ARIPiprazole  5 mg Oral Daily   Buprenorphine HCl  900 mcg Buccal Q12H   calcium-vitamin D  2 tablet Oral Q breakfast   Chlorhexidine Gluconate Cloth  6 each Topical Daily   vitamin B-12  1,000 mcg Oral Daily   dexamethasone (DECADRON) injection  6 mg Intravenous Q24H   ferrous sulfate  325 mg Oral QHS   fluticasone furoate-vilanterol  1 puff Inhalation Daily   folic acid  1 mg Oral Daily   furosemide  40 mg Oral Daily   lidocaine  1 patch Transdermal Q24H   pantoprazole  40 mg Oral Daily   polyethylene glycol  17 g Oral Daily   ranolazine  500 mg Oral BID  rosuvastatin  20 mg Oral Daily   senna-docusate  2 tablet Oral BID   sertraline  50 mg Oral Daily   sodium bicarbonate  650 mg Oral BID   sodium chloride flush  3 mL Intravenous Q12H   sodium chloride flush   3-10 mL Intravenous Q12H   Continuous Infusions: PRN Meds:.acetaminophen **OR** acetaminophen, fluticasone, HYDROmorphone (DILAUDID) injection, ipratropium-albuterol, naLOXone (NARCAN)  injection, oxyCODONE  Current Outpatient Medications  Medication Instructions   ARIPiprazole (ABILIFY) 5 mg, Oral, Daily   aspirin 81 mg, Daily   Belbuca 900 mcg, Every 12 hours   BREO ELLIPTA 100-25 MCG/ACT AEPB 1 puff, Inhalation, Daily   carvedilol (COREG) 3.125 MG tablet TAKE ONE (1) TABLET BY MOUTH TWICE DAILY WITH MEALS   cefdinir (OMNICEF) 300 mg, 2 times daily   denosumab (PROLIA) 60 mg, Subcutaneous, Every 6 months   dextromethorphan 30 mg, Oral, 4 times daily PRN   diclofenac Sodium (VOLTAREN) 2 g, Topical, 4 times daily   Emgality 120 mg, Subcutaneous, Every 30 days   EPINEPHrine (EPI-PEN) 0.3 mg, Intramuscular, As needed   esomeprazole (NEXIUM) 20 MG capsule TAKE ONE (1) CAPSULE BY MOUTH ONCE DAILY   Farxiga 5 mg, Daily   fluticasone (FLONASE) 50 MCG/ACT nasal spray USE 2 SPRAYS IN EACH NOSTRILS DAILY AS NEEDED   furosemide (LASIX) 40 mg, Oral, Daily   gabapentin (NEURONTIN) 800 mg, Oral, 3 times daily, Patient usually takes twice a day   guaiFENesin-codeine 100-10 MG/5ML syrup 5 mLs, Oral, 3 times daily PRN   ipratropium-albuterol (DUONEB) 0.5-2.5 (3) MG/3ML SOLN USE 1 VIAL VIA NEBULIZER EVERY 6 HOURS AS NEEDED FOR SHORTNESS OF BREATH   levocetirizine (XYZAL) 5 mg, Daily PRN   lubiprostone (AMITIZA) 24 MCG capsule TAKE 1 CAPSULE(24 MCG) BY MOUTH TWICE DAILY WITH A MEAL   megestrol (MEGACE) 20 mg, Oral, Daily   Multiple Vitamin (MULTIVITAMIN WITH MINERALS) TABS tablet 1 tablet, Oral, Daily   naloxone (NARCAN) nasal spray 4 mg/0.1 mL 1 spray,  Once   nitrofurantoin (macrocrystal-monohydrate) (MACROBID) 100 mg, 2 times daily   nitroGLYCERIN (NITROSTAT) 0.4 mg, Sublingual, Every 5 min PRN   ondansetron (ZOFRAN) 4 mg, Oral, Every 8 hours PRN   oxyCODONE (ROXICODONE) 15 mg, Every 8 hours PRN    potassium chloride (KLOR-CON) 10 MEQ tablet 30 mEq, Oral, Daily   ranolazine (RANEXA) 500 mg, Oral, 2 times daily   rosuvastatin (CRESTOR) 20 mg, Oral, Daily   sacubitril-valsartan (ENTRESTO) 24-26 MG TAKE ONE (1) TABLET BY MOUTH TWICE DAILY   sertraline (ZOLOFT) 100 MG tablet TAKE 1/2 TABLET(50 MG) BY MOUTH DAILY   tiZANidine (ZANAFLEX) 4 mg, Daily at bedtime   topiramate (TOPAMAX) 50 MG tablet TAKE TWO (2) TABLETS BY MOUTH EVERY DAY AT BEDTIME   Ubrelvy 100 mg, Oral, As needed, TAKE 1 TABLET BY MOUTH BY MOUTH AS NEEDED( MAY REPEAT 1 TABLET AFTER 2 HOURS IF NEEDED, MAXIMUM 2 TABLETS IN 24 HOURS) Strength: 100 mg   Vitamin D, Ergocalciferol, (DRISDOL) 1.25 MG (50000 UNIT) CAPS capsule TAKE 1 CAPSULE BY MOUTH EVERY 7 DAYS FOR 12 DOSES    Diet Orders (From admission, onward)     Start     Ordered   11/21/23 1548  Diet regular Room service appropriate? Yes; Fluid consistency: Thin  Diet effective now       Question Answer Comment  Room service appropriate? Yes   Fluid consistency: Thin      11/21/23 1547  DVT prophylaxis: SCDs Start: 11/14/23 2033   Lab Results  Component Value Date   PLT 248 11/23/2023      Code Status: Full Code  Family Communication: no family at bedside  Status is: Inpatient Remains inpatient appropriate because: severity of illness  Level of care: Med-Surg  Consultants:  Oncology Radiation oncology IR Neurology Neurosurgery  Objective: Vitals:   11/22/23 1323 11/22/23 2130 11/23/23 0515 11/23/23 0925  BP: (!) 105/57 112/66 116/77   Pulse: 80 73 74   Resp: 18     Temp: 98.8 F (37.1 C) 98.3 F (36.8 C) 98.2 F (36.8 C)   TempSrc: Oral Oral Oral   SpO2: 98% 99% 99% 98%  Weight:      Height:        Intake/Output Summary (Last 24 hours) at 11/23/2023 1007 Last data filed at 11/22/2023 1708 Gross per 24 hour  Intake --  Output 1130 ml  Net -1130 ml   Wt Readings from Last 3 Encounters:  11/16/23 73.2 kg  11/05/23 66.9  kg  10/20/23 69 kg    Examination:  Constitutional: NAD Eyes: lids and conjunctivae normal, no scleral icterus ENMT: mmm Neck: normal, supple Respiratory: clear to auscultation bilaterally, no wheezing, no crackles.  Cardiovascular: Regular rate and rhythm, no murmurs / rubs / gallops. No LE edema. Abdomen: soft, no distention, no tenderness. Bowel sounds positive.   Data Reviewed: I have independently reviewed following labs and imaging studies   CBC Recent Labs  Lab 11/17/23 0350 11/19/23 0413 11/21/23 0341 11/23/23 0353  WBC 14.7* 10.7* 10.9* 14.7*  HGB 10.5* 10.2* 10.6* 10.1*  HCT 33.5* 33.7* 33.3* 31.9*  PLT 320 287 268 248  MCV 92.8 98.0 92.2 92.5  MCH 29.1 29.7 29.4 29.3  MCHC 31.3 30.3 31.8 31.7  RDW 14.3 14.5 13.9 14.1  LYMPHSABS  --  1.4 1.1  --   MONOABS  --  0.9 0.5  --   EOSABS  --  0.0 0.0  --   BASOSABS  --  0.1 0.1  --     Recent Labs  Lab 11/18/23 1331 11/19/23 0413 11/20/23 0413 11/21/23 0341 11/23/23 0353  NA 140 140 137 136 138  K 4.4 4.6 4.3 4.3 4.1  CL 112* 114* 110 106 105  CO2 20* 19* 18* 21* 25  GLUCOSE 90 124* 115* 127* 121*  BUN 45* 38* 26* 26* 21  CREATININE 1.83* 1.57* 1.31* 1.39* 1.38*  CALCIUM 8.5* 8.4* 8.5* 8.5* 8.4*  AST  --   --   --  14* 18  ALT  --   --   --  10 12  ALKPHOS  --   --   --  81 90  BILITOT  --   --   --  0.3 0.4  ALBUMIN  --   --   --  2.5* 2.5*  MG  --   --   --  1.6* 1.7    ------------------------------------------------------------------------------------------------------------------ No results for input(s): "CHOL", "HDL", "LDLCALC", "TRIG", "CHOLHDL", "LDLDIRECT" in the last 72 hours.  Lab Results  Component Value Date   HGBA1C 5.5 06/23/2023   ------------------------------------------------------------------------------------------------------------------ No results for input(s): "TSH", "T4TOTAL", "T3FREE", "THYROIDAB" in the last 72 hours.  Invalid input(s): "FREET3"  Cardiac Enzymes No  results for input(s): "CKMB", "TROPONINI", "MYOGLOBIN" in the last 168 hours.  Invalid input(s): "CK" ------------------------------------------------------------------------------------------------------------------    Component Value Date/Time   BNP 193.4 (H) 10/18/2020 0252    CBG: Recent Labs  Lab 11/21/23 1941  GLUCAP 282*    No results found for this or any previous visit (from the past 240 hours).   Radiology Studies: No results found.    Pamella Pert, MD, PhD [facility]  Between 7 am - 7 pm I am available, please contact me via Amion (for emergencies) or Securechat (non urgent messages)  Between 7 pm - 7 am I am not available, please contact night coverage MD/APP via Amion

## 2023-11-23 NOTE — Progress Notes (Signed)
 Physical Therapy Treatment Patient Details Name: [name] MRN: [id number] DOB: 27-Jun-1956 Today's Date: 11/23/2023   History of Present Illness 68 year old female who presented to the emergency department with complaint of back pain radiating to her hips, inability to ambulate, weakness of bilateral lower extremity, MRI could not be done because of presence of pacemaker which was incompatible. CT myelogram done. CT of the thoracolumbar spine showed redemonstration of scattered sclerotic axial skeleton lesions suspicious for mets.Also showed L3-L4, L4-L5, L5-S1 disc bulge with question at least moderate central canal stenosis at the L4-L5 level.     Neurology, neurosurgery,  oncology, radiation oncology and urology consulted. Back pain/lower extremity weakness suspected to be from metastatic bladder cancer. Urology performed cystoscopy- TURBT 11/16/23 [facility]  PMH: COPD, bladder cancer, spinal stenosis, chronic knee pain, sHF, HTN, MDD, PTSD, incisional hernia repair    PT Comments  Pt agreeable to mobilize and assisted with standing.  Upon standing, pt had loose BM on bed pad so provided BSC.  Pt performed standing a couple times for pericare (which OT performed as pt felt unable) however fatigues quickly.  Pt repositioned in recliner end of session.  Current d/c plan remains appropriate.  Patient will benefit from continued inpatient follow up therapy, <3 hours/day.     If plan is discharge home, recommend the following: Help with stairs or ramp for entrance;Assistance with cooking/housework;A little help with walking and/or transfers;A lot of help with bathing/dressing/bathroom   Can travel by private vehicle     No  Equipment Recommendations  None recommended by PT    Recommendations for Other Services       Precautions / Restrictions Precautions Precautions: Back;Fall     Mobility  Bed Mobility Overal bed mobility: Needs Assistance Bed Mobility: Rolling, Sidelying to  Sit Rolling: Min assist, Used rails Sidelying to sit: Min assist, HOB elevated       General bed mobility comments: cues for log roll technique, assist for trunk, pt utilized bed rail to self assist    Transfers Overall transfer level: Needs assistance Equipment used: Rolling walker (2 wheels) Transfers: Sit to/from Stand, Bed to chair/wheelchair/BSC Sit to Stand: Min assist   Step pivot transfers: Min assist, +2 safety/equipment       General transfer comment: verbal cues for hand placement, light assist to rise and control descent; BM present on bed pad upon standing; pt unable to ambulate into bathroom so provided BSC, pt required assist for pericare which was provided by OT; pt fatigued quickly in standing and needed seated rest breaks during hygiene; pt then ambulated approx 3 ft over to recliner    Ambulation/Gait               General Gait Details: pt felt unable to tolerate   Stairs             Wheelchair Mobility     Tilt Bed    Modified Rankin (Stroke Patients Only)       Balance Overall balance assessment: Needs assistance         Standing balance support: Bilateral upper extremity supported, During functional activity, Reliant on assistive device for balance Standing balance-Leahy Scale: Poor                              Communication Communication Communication: No apparent difficulties  Cognition Arousal: Alert Behavior During Therapy: WFL for tasks assessed/performed, Flat affect   PT - Cognitive  impairments: No apparent impairments                         Following commands: Intact      Cueing Cueing Techniques: Verbal cues  Exercises      General Comments        Pertinent Vitals/Pain Pain Assessment Pain Assessment: Faces Faces Pain Scale: Hurts even more Pain Location: Lower back and LEs with movement Pain Descriptors / Indicators: Aching, Radiating Pain Intervention(s): Repositioned,  Monitored during session    Home Living                          Prior Function            PT Goals (current goals can now be found in the care plan section) Progress towards PT goals: Progressing toward goals    Frequency    Min 2X/week      PT Plan      Co-evaluation PT/OT/SLP Co-Evaluation/Treatment: Yes Reason for Co-Treatment: To address functional/ADL transfers PT goals addressed during session: Mobility/safety with mobility OT goals addressed during session: ADL's and self-care      AM-PAC PT "6 Clicks" Mobility   Outcome Measure  Help needed turning from your back to your side while in a flat bed without using bedrails?: A Little Help needed moving from lying on your back to sitting on the side of a flat bed without using bedrails?: A Lot Help needed moving to and from a bed to a chair (including a wheelchair)?: A Lot Help needed standing up from a chair using your arms (e.g., wheelchair or bedside chair)?: A Lot Help needed to walk in hospital room?: A Lot Help needed climbing 3-5 steps with a railing? : Total 6 Click Score: 12    End of Session Equipment Utilized During Treatment: Gait belt Activity Tolerance: Patient limited by fatigue Patient left: in chair;with call bell/phone within reach;with chair alarm set Nurse Communication: Mobility status PT Visit Diagnosis: Other abnormalities of gait and mobility (R26.89);Muscle weakness (generalized) (M62.81)     Time: 1914-7829 PT Time Calculation (min) (ACUTE ONLY): 18 min  Charges:    $Therapeutic Activity: 8-22 mins PT General Charges $$ ACUTE PT VISIT: 1 Visit                    Thomasene Mohair PT, DPT Physical Therapist Acute Rehabilitation Services Office: [phone]    Janan Halter [place] 11/23/2023, 2:19 PM

## 2023-11-23 NOTE — TOC CM/SW Note (Signed)
 CMS list of facilities and star ratings provided to pt to review for facility preference.       [facility] [address] [place], [place] [address] [phone] Overall rating ??  Below average  [facility] at [facility] [address] [place], [place] [address] [phone] Overall rating ? Below average  [facility] [address] [place], [place] [address] [phone] Overall rating? Below average  [facility] [address] [place], [place] [address] [phone] Overall rating ? Much below average  [facility] [address] [place], [place] [address] [phone] Overall rating ???? Average  [facility] [address] [place], [place] [address] [phone] Overall rating ? Much below average   [facility] [address] [place], [place] [address] [phone] Overall rating ?? Much below average  [employer] [facility] [employer] [address] [place], [place] [address] [phone] Overall rating ??? Average  [facility] [address] [place], [place] [address] [phone] Overall rating ? Much below average  [facility] [address] [place], [place] [address] [phone] Overall rating ?? Much below average  [facility] [address] [place], [place] [address] [phone] Overall rating ??? Above average  [facility] [address] [place], [place] [address] [phone] Overall rating ????? Much above average  [facility] [address] [place], [place] [address] [phone] Overall rating ???? Above average  [facility] [address] [place], [place]  [address] [phone] Overall rating ????? Much above average  [facility] [address] [place], [place] [address] [phone] Overall rating ????  [facility] [address] [place], [place] [address] [phone] Overall rating ???? Much above average  [facility] [address] [place], [place] [address] [phone] Overall rating ????? Much above average  [facility] [address] [place], [place] [address] [phone] Overall rating ? Much below average  [facility] [address] [place], [place] [address] [phone] Overall rating ??? Average  [facility] [address] [place], [place] [address] [phone] Overall rating ????? Much above average  [facility] [address] [place], [place] [address] [phone] Overall rating ??? Average  [facility] [address] [place], [place] [address] [phone] Overall rating ?? Below average  [facility] [address] [place], [place] [address] [phone] Overall rating ??? Average  [facility] [address] [place], [place] [address] [phone] Overall rating ????? Much above average  [facility] [address] [place], [place] [address] [phone] Overall rating ? Much below average  [facility]	[place] [facility], [place] [address] [phone] Overall rating ????? Much above average  [facility] [address] [place], [place] [address] [phone] Overall rating ??? Much below average  [facility] [address] [place], [place] [address] [phone] Overall rating ??? Average  [facility]  [facility] [address] [place], [place] [address] [phone] Overall rating ??  Below average  [facility] [address] [place], [place] [address] [phone] Overall rating ? Much below average  [facility] [address] [place], [place] [address] [phone] Overall rating ??? Average  [facility] [address] [place], [place] [address] [phone] Overall rating ? Much below average  [facility] [address] [place], [place] [address] [phone] Overall rating ??? Average  [facility] [address] [place], [place] [address] [phone] Overall rating ? Much below average  [facility] [address] [place], [place] [address] [phone] Overall rating ????? Much above average  [facility] [address] [place], [place] [address] [phone] Overall rating ? Much below average  [facility] [address] [place], [place] [address] [phone] Overall rating ???? Above average  [facility] [address] [place], [place] [address] [phone] Overall rating ????? Above average  [facility] [address] [place], [place] [address] [phone] Overall rating ???? Above average  [facility] [address] [place], [place] [address] [phone] Overall rating ????? Much above average  [facility] [address] [place], [place] [address] [phone] Overall rating ? Much below average  [facility] [address] [place], [place] [address] [phone] Overall rating ????? Much above average  [facility] [address] [place], [place] [address] [phone] Overall rating ? Much below  average  [facility] [address] [place], [place] [address] [phone] Overall rating ????? Much above average  [facility] [address] [place], [place] [address] [phone] Overall rating ????? Much above average  [facility] [address] [place], [place] [address] [phone] Overall rating ??? Average  [facility] [address] [place], [place] [address] [phone] Overall rating ??? Average  [facility] [address] [place], [place] [address] [phone] Overall rating ???? Above average  [facility] [address] [place], [place] [address] [phone] Overall rating ?? Below average  [facility] [address] [place], [place] [address] [phone] Overall rating ???? Above average  [facility] [address] [place], [place] [address] [phone] Overall rating ?? Below average  [facility] [address] [place], [place] [address] [phone] Overall rating ??

## 2023-11-23 NOTE — TOC Progression Note (Signed)
 Transition of Care [facility]) - Progression Note    Patient Details  Name: [name] MRN: [id number] Date of Birth: 1956-07-15  Transition of Care [facility]) CM/SW Contact  Diona Browner, [place] Phone Number: 11/23/2023, 3:51 PM  Clinical Narrative:    CSW reviewed bed offers w/ pt. Pt bed choice is [facility]. CSW started insurance auth, pending approval.   Expected Discharge Plan: Skilled Nursing Facility Barriers to Discharge: Continued Medical Work up  Expected Discharge Plan and Services       Living arrangements for the past 2 months: Single Family Home                                       Social Determinants of Health (SDOH) Interventions SDOH Screenings   Food Insecurity: No Food Insecurity (11/15/2023)  Housing: Low Risk  (11/15/2023)  Transportation Needs: Unmet Transportation Needs (11/15/2023)  Utilities: Not At Risk (11/15/2023)  Alcohol Screen: Low Risk  (12/31/2022)  Depression (PHQ2-9): High Risk (09/25/2023)  Financial Resource Strain: Low Risk  (04/05/2023)  Physical Activity: Insufficiently Active (04/05/2023)  Social Connections: Moderately Isolated (11/15/2023)  Stress: Stress Concern Present (04/05/2023)  Tobacco Use: Medium Risk (11/16/2023)  Health Literacy: Adequate Health Literacy (04/07/2023)    Readmission Risk Interventions     No data to display

## 2023-11-23 NOTE — Plan of Care (Signed)
  Problem: Health Behavior/Discharge Planning: Goal: Ability to manage health-related needs will improve Outcome: Progressing   Problem: Activity: Goal: Risk for activity intolerance will decrease Outcome: Progressing   Problem: Nutrition: Goal: Adequate nutrition will be maintained Outcome: Progressing   Problem: Elimination: Goal: Will not experience complications related to bowel motility Outcome: Progressing Goal: Will not experience complications related to urinary retention Outcome: Progressing   

## 2023-11-23 NOTE — Plan of Care (Signed)
  Problem: Health Behavior/Discharge Planning: Goal: Ability to manage health-related needs will improve Outcome: Progressing   Problem: Activity: Goal: Risk for activity intolerance will decrease Outcome: Progressing   Problem: Nutrition: Goal: Adequate nutrition will be maintained Outcome: Progressing   Problem: Elimination: Goal: Will not experience complications related to bowel motility Outcome: Progressing Goal: Will not experience complications related to urinary retention Outcome: Progressing   Problem: Pain Managment: Goal: General experience of comfort will improve and/or be controlled Outcome: Progressing

## 2023-11-24 ENCOUNTER — Ambulatory Visit: Admitting: Cardiology

## 2023-11-24 DIAGNOSIS — G894 Chronic pain syndrome: Secondary | ICD-10-CM | POA: Diagnosis not present

## 2023-11-24 DIAGNOSIS — Z7982 Long term (current) use of aspirin: Secondary | ICD-10-CM | POA: Diagnosis not present

## 2023-11-24 DIAGNOSIS — C679 Malignant neoplasm of bladder, unspecified: Secondary | ICD-10-CM | POA: Diagnosis not present

## 2023-11-24 DIAGNOSIS — D518 Other vitamin B12 deficiency anemias: Secondary | ICD-10-CM | POA: Diagnosis not present

## 2023-11-24 DIAGNOSIS — E440 Moderate protein-calorie malnutrition: Secondary | ICD-10-CM | POA: Diagnosis not present

## 2023-11-24 DIAGNOSIS — S32010D Wedge compression fracture of first lumbar vertebra, subsequent encounter for fracture with routine healing: Secondary | ICD-10-CM | POA: Diagnosis not present

## 2023-11-24 DIAGNOSIS — I5030 Unspecified diastolic (congestive) heart failure: Secondary | ICD-10-CM | POA: Diagnosis not present

## 2023-11-24 DIAGNOSIS — M545 Low back pain, unspecified: Secondary | ICD-10-CM | POA: Diagnosis not present

## 2023-11-24 DIAGNOSIS — N179 Acute kidney failure, unspecified: Secondary | ICD-10-CM | POA: Diagnosis not present

## 2023-11-24 DIAGNOSIS — I2510 Atherosclerotic heart disease of native coronary artery without angina pectoris: Secondary | ICD-10-CM | POA: Diagnosis not present

## 2023-11-24 DIAGNOSIS — S22050D Wedge compression fracture of T5-T6 vertebra, subsequent encounter for fracture with routine healing: Secondary | ICD-10-CM | POA: Diagnosis not present

## 2023-11-24 DIAGNOSIS — J449 Chronic obstructive pulmonary disease, unspecified: Secondary | ICD-10-CM | POA: Diagnosis not present

## 2023-11-24 DIAGNOSIS — Z79899 Other long term (current) drug therapy: Secondary | ICD-10-CM | POA: Diagnosis not present

## 2023-11-24 DIAGNOSIS — G8221 Paraplegia, complete: Secondary | ICD-10-CM | POA: Diagnosis not present

## 2023-11-24 DIAGNOSIS — E7849 Other hyperlipidemia: Secondary | ICD-10-CM | POA: Diagnosis not present

## 2023-11-24 DIAGNOSIS — R531 Weakness: Secondary | ICD-10-CM | POA: Diagnosis not present

## 2023-11-24 DIAGNOSIS — R262 Difficulty in walking, not elsewhere classified: Secondary | ICD-10-CM | POA: Diagnosis not present

## 2023-11-24 DIAGNOSIS — M899 Disorder of bone, unspecified: Secondary | ICD-10-CM | POA: Diagnosis not present

## 2023-11-24 DIAGNOSIS — M549 Dorsalgia, unspecified: Secondary | ICD-10-CM | POA: Diagnosis not present

## 2023-11-24 DIAGNOSIS — D631 Anemia in chronic kidney disease: Secondary | ICD-10-CM | POA: Diagnosis not present

## 2023-11-24 DIAGNOSIS — N1330 Unspecified hydronephrosis: Secondary | ICD-10-CM | POA: Diagnosis not present

## 2023-11-24 DIAGNOSIS — G893 Neoplasm related pain (acute) (chronic): Secondary | ICD-10-CM | POA: Diagnosis not present

## 2023-11-24 DIAGNOSIS — M810 Age-related osteoporosis without current pathological fracture: Secondary | ICD-10-CM | POA: Diagnosis not present

## 2023-11-24 DIAGNOSIS — G8220 Paraplegia, unspecified: Secondary | ICD-10-CM | POA: Diagnosis not present

## 2023-11-24 DIAGNOSIS — S22060D Wedge compression fracture of T7-T8 vertebra, subsequent encounter for fracture with routine healing: Secondary | ICD-10-CM | POA: Diagnosis not present

## 2023-11-24 DIAGNOSIS — Z7401 Bed confinement status: Secondary | ICD-10-CM | POA: Diagnosis not present

## 2023-11-24 MED ORDER — FERROUS SULFATE 325 (65 FE) MG PO TABS: 325.0000 mg | ORAL_TABLET | Freq: Every day | ORAL | Status: DC

## 2023-11-24 MED ORDER — CYANOCOBALAMIN 1000 MCG PO TABS
1000.0000 ug | ORAL_TABLET | Freq: Every day | ORAL | Status: DC
Start: 2023-11-25 — End: 2023-12-25

## 2023-11-24 MED ORDER — OXYCODONE HCL 15 MG PO TABS: 15.0000 mg | ORAL_TABLET | Freq: Three times a day (TID) | ORAL | 0 refills | Status: DC | PRN

## 2023-11-24 MED ORDER — DEXAMETHASONE 6 MG PO TABS: 6.0000 mg | ORAL_TABLET | Freq: Every day | ORAL | Status: DC

## 2023-11-24 MED ORDER — BELBUCA 900 MCG BU FILM: 900.0000 ug | ORAL_FILM | Freq: Two times a day (BID) | BUCCAL | 0 refills | Status: DC

## 2023-11-24 MED ORDER — LIDOCAINE 5 % EX PTCH: 1.0000 | MEDICATED_PATCH | CUTANEOUS | Status: DC

## 2023-11-24 MED ORDER — FOLIC ACID 1 MG PO TABS: 1.0000 mg | ORAL_TABLET | Freq: Every day | ORAL | Status: DC

## 2023-11-24 NOTE — Progress Notes (Signed)
 Patient ID: [name], female   DOB: 09-Nov-1955, 68 y.o.   MRN: [id number]    Referring Physician(s): Winter,C/ Chang,R   Supervising Physician: Roanna Banning  Patient Status:  [facility] - In-pt  Chief Complaint:  Chronic low back pain, metastatic bladder cancer/hydronephrosis; s/p right nephrostomy 11/16/23, s/p T5 biopsy 11/21/23   Subjective:  Pt is day 8 post op right PCN - doing well with no new complaints. Continues to drain well. clear yellow urine noted in bag at bedside.   Pt is day 3 post T5 biopsy. Reports no pain or other complaints. Only complaint regarding back is her chronic lower back pain.  Allergies: Patient has no known allergies.  Medications: Prior to Admission medications   Medication Sig Start Date End Date Taking? Authorizing Provider  ARIPiprazole (ABILIFY) 5 MG tablet TAKE 1 TABLET BY MOUTH DAILY 10/07/23  Yes Cox, Kirsten, MD  aspirin 81 MG tablet Take 81 mg by mouth daily.   Yes [provider]  BREO ELLIPTA 100-25 MCG/ACT AEPB INHALE 1 PUFF INTO THE LUNGS DAILY. Patient taking differently: Inhale 1 puff into the lungs daily as needed. 06/03/23  Yes Craft, Huston Foley, PA  Buprenorphine HCl (BELBUCA) 900 MCG FILM Place 900 mcg inside cheek every 12 (twelve) hours.   Yes [provider]  carvedilol (COREG) 3.125 MG tablet TAKE ONE (1) TABLET BY MOUTH TWICE DAILY WITH MEALS Patient taking differently: Take 3.125 mg by mouth 2 (two) times daily with a meal. 06/02/23  Yes Cox, Kirsten, MD  cefdinir (OMNICEF) 300 MG capsule Take 300 mg by mouth 2 (two) times daily. 11/10/23  Yes [provider]  denosumab (PROLIA) 60 MG/ML SOSY injection Inject 60 mg into the skin every 6 (six) months. 02/14/20  Yes Abigail Miyamoto, MD  diclofenac Sodium (VOLTAREN) 1 % GEL Apply 2 g topically 4 (four) times daily. Patient taking differently: Apply 2 g topically as needed (Pain). 05/01/21  Yes Abigail Miyamoto, MD  EPINEPHRINE 0.3 mg/0.3 mL IJ  SOAJ injection Inject 0.3 mLs (0.3 mg total) into the muscle as needed for anaphylaxis. 04/27/20  Yes Abigail Miyamoto, MD  esomeprazole (NEXIUM) 20 MG capsule TAKE ONE (1) CAPSULE BY MOUTH ONCE DAILY 08/28/23  Yes Cox, Kirsten, MD  FARXIGA 5 MG TABS tablet Take 5 mg by mouth daily. 10/10/23  Yes [provider]  fluticasone (FLONASE) 50 MCG/ACT nasal spray USE 2 SPRAYS IN EACH NOSTRILS DAILY AS NEEDED Patient taking differently: Place 2 sprays into both nostrils daily as needed for allergies or rhinitis. 07/18/23  Yes [name], FNP  furosemide (LASIX) 40 MG tablet Take 1 tablet (40 mg total) by mouth daily. 06/25/23 11/14/23 Yes Georgeanna Lea, MD  gabapentin (NEURONTIN) 800 MG tablet Take 1 tablet (800 mg total) by mouth 3 (three) times daily. Patient usually takes twice a day 01/21/20  Yes Abigail Miyamoto, MD  Galcanezumab-gnlm [facility]) 120 MG/ML SOSY Inject 120 mg into the skin every 30 (thirty) days. 01/06/23  Yes Cox, Kirsten, MD  ipratropium-albuterol (DUONEB) 0.5-2.5 (3) MG/3ML SOLN USE 1 VIAL VIA NEBULIZER EVERY 6 HOURS AS NEEDED FOR SHORTNESS OF BREATH 10/16/23  Yes [name], FNP  levocetirizine (XYZAL) 5 MG tablet Take 5 mg by mouth daily as needed for allergies. 12/13/19  Yes [provider]  lubiprostone (AMITIZA) 24 MCG capsule TAKE 1 CAPSULE(24 MCG) BY MOUTH TWICE DAILY WITH A MEAL 09/15/23  Yes Cox, Kirsten, MD  megestrol (MEGACE) 20 MG tablet Take 1  tablet (20 mg total) by mouth daily. 06/19/23  Yes Cox, Kirsten, MD  Multiple Vitamin (MULTIVITAMIN WITH MINERALS) TABS tablet Take 1 tablet by mouth daily. 10/30/20  Yes Rodolph Bong, MD  naloxone [facility]) nasal spray 4 mg/0.1 mL Place 1 spray into the nose once. 01/12/21  Yes [provider]  nitrofurantoin, macrocrystal-monohydrate, (MACROBID) 100 MG capsule Take 100 mg by mouth 2 (two) times daily. 11/10/23  Yes [provider]  nitroGLYCERIN (NITROSTAT) 0.4 MG SL tablet Place  1 tablet (0.4 mg total) under the tongue every 5 (five) minutes as needed for chest pain. 11/27/21  Yes Georgeanna Lea, MD  oxyCODONE (ROXICODONE) 15 MG immediate release tablet Take 15 mg by mouth every 8 (eight) hours as needed for pain. 08/29/21  Yes [provider]  potassium chloride (KLOR-CON) 10 MEQ tablet Take 3 tablets (30 mEq total) by mouth daily for 7 days. 10/29/23 11/14/23 Yes Camnitz, Will Daphine Deutscher, MD  ranolazine (RANEXA) 500 MG 12 hr tablet TAKE 1 TABLET BY MOUTH TWICE DAILY 10/28/23  Yes Georgeanna Lea, MD  rosuvastatin (CRESTOR) 20 MG tablet TAKE 1 TABLET BY MOUTH ONCE DAILY 08/28/23  Yes Cox, Kirsten, MD  sacubitril-valsartan (ENTRESTO) 24-26 MG TAKE ONE (1) TABLET BY MOUTH TWICE DAILY Patient taking differently: Take 1 tablet by mouth 2 (two) times daily. 06/02/23  Yes Cox, Kirsten, MD  sertraline (ZOLOFT) 100 MG tablet TAKE 1/2 TABLET(50 MG) BY MOUTH DAILY 08/24/23  Yes Cox, Kirsten, MD  tiZANidine (ZANAFLEX) 4 MG tablet Take 4 mg by mouth at bedtime. 11/09/20  Yes [provider]  topiramate (TOPAMAX) 50 MG tablet TAKE TWO (2) TABLETS BY MOUTH EVERY DAY AT BEDTIME Patient taking differently: Take 100 mg by mouth at bedtime. TAKE TWO (2) TABLETS BY MOUTH EVERY DAY AT BEDTIME 06/02/23  Yes Cox, Kirsten, MD  Ubrogepant (UBRELVY) 100 MG TABS Take 1 tablet (100 mg total) by mouth as needed (May repeat after 2 hours.  Maximum 2 tablets in 24 hours). TAKE 1 TABLET BY MOUTH BY MOUTH AS NEEDED( MAY REPEAT 1 TABLET AFTER 2 HOURS IF NEEDED, MAXIMUM 2 TABLETS IN 24 HOURS) Strength: 100 mg 04/07/23  Yes Cox, Kirsten, MD  Vitamin D, Ergocalciferol, (DRISDOL) 1.25 MG (50000 UNIT) CAPS capsule TAKE 1 CAPSULE BY MOUTH EVERY 7 DAYS FOR 12 DOSES Patient taking differently: Take 50,000 Units by mouth every 7 (seven) days. Patient usually takes this medication on mondays 06/30/23  Yes Cox, Kirsten, MD  dextromethorphan 15 MG/5ML syrup Take 10 mLs (30 mg total) by mouth 4 (four) times daily  as needed for cough. Patient not taking: Reported on 11/14/2023 10/22/23   [name], FNP  guaiFENesin-codeine 100-10 MG/5ML syrup Take 5 mLs by mouth 3 (three) times daily as needed for cough. 10/16/23   [name], FNP  ondansetron (ZOFRAN) 4 MG tablet Take 1 tablet (4 mg total) by mouth every 8 (eight) hours as needed for nausea or vomiting. 05/17/20   Abigail Miyamoto, MD     Vital Signs: BP 116/69 (BP Location: Left Arm)   Pulse 71   Temp 98.4 F (36.9 C) (Oral)   Resp 17   Ht 4\' 10"  (1.473 m)   Wt 161 lb 6 oz (73.2 kg)   SpO2 98%   BMI 33.73 kg/m   Physical Exam Vitals and nursing note reviewed.  Constitutional:      Appearance: Normal appearance. She is not ill-appearing or toxic-appearing.     Comments: Sitting up in  bed, appears well  HENT:     Mouth/Throat:     Mouth: Mucous membranes are moist.     Pharynx: Oropharynx is clear.  Cardiovascular:     Rate and Rhythm: Normal rate and regular rhythm.  Pulmonary:     Effort: Pulmonary effort is normal.     Breath sounds: Normal breath sounds.  Abdominal:     Palpations: Abdomen is soft.     Tenderness: There is no abdominal tenderness.  Genitourinary:    Comments: R PCN appears well. No external abnormality. No pain. of clear yellow urine noted in bag at bedside. Musculoskeletal:     Right lower leg: No edema.     Left lower leg: No edema.     Comments: No ttp of thoracic spine. No external abnormality appreciated. Only complaint patient has with her back is her lumbar back pain that is "always there"  Skin:    General: Skin is warm and dry.  Neurological:     General: No focal deficit present.     Mental Status: She is alert and oriented to person, place, and time. Mental status is at baseline.     Imaging: CT BONE TROCAR/NEEDLE BIOPSY DEEP Result Date: 11/21/2023 INDICATION: Newly diagnosed bladder cancer with multifocal osseous metastatic disease. Patient presents for attempted CT-guided  biopsy of the right transverse process of T5. EXAM: CT-guided deep bone biopsy TECHNIQUE: Multidetector CT imaging of the thoracic spine was performed following the standard protocol without IV contrast. RADIATION DOSE REDUCTION: This exam was performed according to the departmental dose-optimization program which includes automated exposure control, adjustment of the mA and/or kV according to patient size and/or use of iterative reconstruction technique. MEDICATIONS: None. ANESTHESIA/SEDATION: Moderate (conscious) sedation was employed during this procedure. A total of Versed 2 mg and Fentanyl 100 mcg was administered intravenously by the radiology nurse. Total intra-service moderate Sedation Time: 18 minutes. The patient's level of consciousness and vital signs were monitored continuously by radiology nursing throughout the procedure under my direct supervision. COMPLICATIONS: None immediate. PROCEDURE: Informed written consent was obtained from the patient after a thorough discussion of the procedural risks, benefits and alternatives. All questions were addressed. Maximal Sterile Barrier Technique was utilized including caps, mask, sterile gowns, sterile gloves, sterile drape, hand hygiene and skin antiseptic. A timeout was performed prior to the initiation of the procedure. Axial CT imaging was performed. The irregular month eaten appearance of the transverse process on the right at T5 was successfully identified. A suitable skin entry site was selected and marked. Local anesthesia was attained by infiltration with 1% lidocaine. A small dermatotomy was made. Under careful intermittent CT guidance, the 11 gauge osteo introducer was advanced and positioned against the tip of the spinous process. Several 9 gauge core biopsies were then obtained coaxially through the abnormal appearing transverse process. Biopsy specimens were placed in formalin and delivered to pathology for further analysis. The patient tolerated  the procedure well. Post CT imaging demonstrates no evidence of immediate complication. IMPRESSION: Technically successful CT-guided core biopsy of the right transverse process at T5. Electronically Signed   By: Malachy Moan M.D.   On: 11/21/2023 14:28    Labs:  CBC: Recent Labs    11/17/23 0350 11/19/23 0413 11/21/23 0341 11/23/23 0353  WBC 14.7* 10.7* 10.9* 14.7*  HGB 10.5* 10.2* 10.6* 10.1*  HCT 33.5* 33.7* 33.3* 31.9*  PLT 320 287 268 248    COAGS: Recent Labs    11/15/23 0320  INR 1.1  APTT 26    BMP: Recent Labs    11/19/23 0413 11/20/23 0413 11/21/23 0341 11/23/23 0353  NA 140 137 136 138  K 4.6 4.3 4.3 4.1  CL 114* 110 106 105  CO2 19* 18* 21* 25  GLUCOSE 124* 115* 127* 121*  BUN 38* 26* 26* 21  CALCIUM 8.4* 8.5* 8.5* 8.4*  CREATININE 1.57* 1.31* 1.39* 1.38*  GFRNONAA 35* 44* 41* 41*    LIVER FUNCTION TESTS: Recent Labs    09/25/23 1449 11/14/23 1649 11/21/23 0341 11/23/23 0353  BILITOT 0.3 0.5 0.3 0.4  AST 12 24 14* 18  ALT 7 15 10 12   ALKPHOS 78 72 81 90  PROT 6.5 7.6 5.2* 4.8*  ALBUMIN 4.3 3.6 2.5* 2.5*    Assessment and Plan:  Right PCN: continue to monitor while patient is in the hospital. Continuing to drain well, externally appears without abnormality. Please contact IR APP or on call MD if any changes occur with this.  T5 biopsy: Pt with no pain or complaint in thoracic back, as was before procedure. No external abnormality on exam today. Pathology results pending and to be managed by neurosurgery, and other associated teams.  Electronically Signed: Katheren Puller, PA-C 11/24/2023, 10:13 AM   I spent a total of 15 Minutes at the the patient's bedside AND on the patient's hospital floor or unit, greater than 50% of which was counseling/coordinating care for post right PCN placement and post T5 biopsy.

## 2023-11-24 NOTE — Discharge Summary (Signed)
 Physician Discharge Summary  [name] ZOX:[id number] DOB: 11-08-1955 DOA: 11/14/2023  PCP: [name], FNP  Admit date: 11/14/2023 Discharge date: 11/24/2023  Admitted From: home Disposition:  SNF  Recommendations for Outpatient Follow-up:  Follow up with PCP in 1-2 weeks Follow up with Dr Cherly Hensen and Dr Berneice Heinrich as an outpatient  Spine biopsy pending at the time of discharge Due to AKI, normotensive, Entresto held at the time of discharge, please reevaluate as an outpatient when it can be resumed Continue Decadron upon discharge, to be tapered per oncology as an outpatient Follow-up with interventional radiology for her nephrostomy tube  Home Health: none Equipment/Devices: none  Discharge Condition: stable CODE STATUS: Full code Diet Orders (From admission, onward)     Start     Ordered   11/21/23 1548  Diet regular Room service appropriate? Yes; Fluid consistency: Thin  Diet effective now       Question Answer Comment  Room service appropriate? Yes   Fluid consistency: Thin      11/21/23 1547            HPI: Per admitting MD, [name] is a 68 y.o. female with medical history significant of COPD. Since at least 11/05/2023 patient has had  back pain radiating from her mid to low back down to her hip. The pain starts in the back and moves downwards. 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 She wears a brace to alleviate the pain, which helps if she does not move.  More recently, per report patient was diagnosed with bladder cancer and spinal stenosis however apparently this has not been biopsied yet. Per ER report : CT March 4 reviewed showing concern for bladder wall thickening and neoplasm as well as obstructing mass near the right UVJ and hydronephrosis, and concern for bony metastasis of the pubic rami or suspicious lesions. Patient also has an age-indeterminate T7 compression fracture. Regardless patient was in  her usual state of health till approximately 4 days ago when she reports a sudden worsening of her low midline back pain without any radiation worsened with certain positions or movement.  Associated with new weakness of bilateral lower extremity with inability to get back up and ambulate.  Associated with inability to urinate and no bowel movement since then.  Patient reports she is still passing gas and abdominal pain no vomiting.  Patient states that she has been managing at home as her family member have been helping her with activities of daily living.  Patient however had a worsening back pain over the last 24 hours prompting her to come to the ER.  Hospital Course / Discharge diagnoses: Principal Problem:   Paraplegia (HCC) Active Problems:   Osteoporosis   AKI (acute kidney injury) (HCC)   Urothelial carcinoma of bladder (HCC)   Bone lesion   Low vitamin B12 level   Dietary folate deficiency anemia   Iron deficiency   Principal problem Bilateral Lower Extremity Weakness, back pain, thoracic and lumbar Compression Fractures -neurology, neurosurgery, oncology, radiation oncology consulted.  Dr. Otelia Limes evaluated patient, suspicion was that this may be related to gabapentin toxicity, recommending to discontinue or not resuming as an outpatient.  MRI could not be obtained due to presence of pacemaker and she underwent a myelogram, Dr. Franky Macho with neurosurgery evaluated patient and did not appreciate any compressive lesion other than L4-L5 displaced disc with low suspicion for compression lesions.  Imaging also showed scattered sclerotic  axial skeletal lesions suspicious for mets.  After discussing at tumor board, patient's current symptoms are unlikely to be related to spinal cord compression from metastatic disease, and there is no clear role for radiation therapy.  She was started on Decadron, she is now improving, able to ambulate a few steps and overall getting better. There is concern about  her having metastatic disease, IR consulted and underwent T5 biopsy on 3/28.  Biopsy results are pending.  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 Path notable for high grade urothelial carcinoma showing squamous differentiation. S/p R nephrostomy tube 3/23 due to hydronephrosis. Oncology and urology consulted and following, awaiting pathology from T5 to determine staging and plans for treatment.  This could be done as an outpatient AKI on CKD IIIb - R severe hydroureteronephrosis. S/p R PCN by IR on 3/23.  Baseline creatinine around 1.7, was as high as 2.8 on 3/21.  Creatinine has remained stable.  Due to being normotensive, hold Entresto at the time of discharge, consider resuming as an outpatient  Chronic Diastolic Heart Failure -continue furosemide, appears euvolemic.  Hold Entresto as above due to being normotensive Folic acid deficiency-continue supplements.  B12 borderline low and will supplement that as well Chronic Pain -continue home regimen COPD - Not wheezing, respiratory status appears stable  Hyperlipidemia-continue lipid-lowering medications Leukocytosis  - Monitor, due to steroids Obesity, class I - BMI 33.  Sepsis ruled out   Discharge Instructions   Allergies as of 11/24/2023   No Known Allergies      Medication List     STOP taking these medications    cefdinir 300 MG capsule Commonly known as: OMNICEF   Entresto 24-26 MG Generic drug: sacubitril-valsartan   gabapentin 800 MG tablet Commonly known as: NEURONTIN   nitrofurantoin (macrocrystal-monohydrate) 100 MG capsule Commonly known as: MACROBID   predniSONE 20 MG tablet Commonly known as:  DELTASONE       TAKE these medications    ARIPiprazole 5 MG tablet Commonly known as: ABILIFY TAKE 1 TABLET BY MOUTH DAILY   aspirin 81 MG tablet Take 81 mg by mouth daily.   Belbuca 900 MCG Film Generic drug: Buprenorphine HCl Place 900 mcg inside cheek every 12 (twelve) hours for 5 days.   Breo Ellipta 100-25 MCG/ACT Aepb Generic drug: fluticasone furoate-vilanterol INHALE 1 PUFF INTO THE LUNGS DAILY. What changed:  when to take this reasons to take this   carvedilol 3.125 MG tablet Commonly known as: COREG TAKE ONE (1) TABLET BY MOUTH TWICE DAILY WITH MEALS What changed: See the new instructions.   cyanocobalamin 1000 MCG tablet Take 1 tablet (1,000 mcg total) by mouth daily. Start taking on: November 25, 2023   denosumab 60 MG/ML Sosy injection Commonly known as: PROLIA Inject 60 mg into the skin every 6 (six) months.   dexamethasone 6 MG tablet Commonly known as: DECADRON Take 1 tablet (6 mg total) by mouth daily.   dextromethorphan 15 MG/5ML syrup Take 10 mLs (30 mg total) by mouth 4 (four) times daily as needed for cough.   diclofenac Sodium 1 % Gel Commonly known as: Voltaren Apply 2 g topically 4 (four) times daily. What changed:  when to take this reasons to take  this   Emgality 120 MG/ML Sosy Generic drug: Galcanezumab-gnlm Inject 120 mg into the skin every 30 (thirty) days.   EPINEPHrine 0.3 mg/0.3 mL Soaj injection Commonly known as: EPI-PEN Inject 0.3 mLs (0.3 mg total) into the muscle as needed for anaphylaxis.   esomeprazole 20 MG capsule Commonly known as: NEXIUM TAKE ONE (1) CAPSULE BY MOUTH ONCE DAILY   Farxiga 5 MG Tabs tablet Generic drug: dapagliflozin propanediol Take 5 mg by mouth daily.   ferrous sulfate 325 (65 FE) MG tablet Take 1 tablet (325 mg total) by mouth at bedtime.   fluticasone 50 MCG/ACT nasal spray Commonly known as: FLONASE USE 2 SPRAYS IN EACH NOSTRILS DAILY AS NEEDED What changed: See the new  instructions.   folic acid 1 MG tablet Commonly known as: FOLVITE Take 1 tablet (1 mg total) by mouth daily. Start taking on: November 25, 2023   furosemide 40 MG tablet Commonly known as: LASIX Take 1 tablet (40 mg total) by mouth daily.   guaiFENesin-codeine 100-10 MG/5ML syrup Take 5 mLs by mouth 3 (three) times daily as needed for cough.   ipratropium-albuterol 0.5-2.5 (3) MG/3ML Soln Commonly known as: DUONEB USE 1 VIAL VIA NEBULIZER EVERY 6 HOURS AS NEEDED FOR SHORTNESS OF BREATH   levocetirizine 5 MG tablet Commonly known as: XYZAL Take 5 mg by mouth daily as needed for allergies.   lidocaine 5 % Commonly known as: LIDODERM Place 1 patch onto the skin daily. Remove & Discard patch within 12 hours or as directed by MD   lubiprostone 24 MCG capsule Commonly known as: AMITIZA TAKE 1 CAPSULE(24 MCG) BY MOUTH TWICE DAILY WITH A MEAL   megestrol 20 MG tablet Commonly known as: MEGACE Take 1 tablet (20 mg total) by mouth daily.   multivitamin with minerals Tabs tablet Take 1 tablet by mouth daily.   naloxone 4 MG/0.1ML Liqd nasal spray kit Commonly known as: NARCAN Place 1 spray into the nose once.   nitroGLYCERIN 0.4 MG SL tablet Commonly known as: NITROSTAT Place 1 tablet (0.4 mg total) under the tongue every 5 (five) minutes as needed for chest pain.   ondansetron 4 MG tablet Commonly known as: Zofran Take 1 tablet (4 mg total) by mouth every 8 (eight) hours as needed for nausea or vomiting.   oxyCODONE 15 MG immediate release tablet Commonly known as: ROXICODONE Take 1 tablet (15 mg total) by mouth every 8 (eight) hours as needed for pain.   potassium chloride 10 MEQ tablet Commonly known as: KLOR-CON Take 3 tablets (30 mEq total) by mouth daily for 7 days.   ranolazine 500 MG 12 hr tablet Commonly known as: RANEXA TAKE 1 TABLET BY MOUTH TWICE DAILY   rosuvastatin 20 MG tablet Commonly known as: CRESTOR TAKE 1 TABLET BY MOUTH ONCE DAILY   sertraline  100 MG tablet Commonly known as: ZOLOFT TAKE 1/2 TABLET(50 MG) BY MOUTH DAILY   tiZANidine 4 MG tablet Commonly known as: ZANAFLEX Take 4 mg by mouth at bedtime.   topiramate 50 MG tablet Commonly known as: TOPAMAX TAKE TWO (2) TABLETS BY MOUTH EVERY DAY AT BEDTIME What changed:  how much to take how to take this when to take this   Ubrelvy 100 MG Tabs Generic drug: Ubrogepant Take 1 tablet (100 mg total) by mouth as needed (May repeat after 2 hours.  Maximum 2 tablets in 24 hours). TAKE 1 TABLET BY MOUTH BY MOUTH AS NEEDED( MAY REPEAT 1 TABLET AFTER 2 HOURS IF NEEDED, MAXIMUM  2 TABLETS IN 24 HOURS) Strength: 100 mg   Vitamin D (Ergocalciferol) 1.25 MG (50000 UNIT) Caps capsule Commonly known as: DRISDOL TAKE 1 CAPSULE BY MOUTH EVERY 7 DAYS FOR 12 DOSES What changed: See the new instructions.        Contact information for follow-up providers     Manny, Delbert Phenix., MD Follow up.   Specialty: Urology Why: The office will call to schedule you appointment to discuss surgical options to address you bladder cancer. Contact information: [address] [place] [address] [phone]              Contact information for after-discharge care     Destination     [facility] .   Service: Skilled Nursing Contact information: [address] [place] [address] [phone]                     Consultations: Oncology Radiation oncology Neurology Neurosurgery Interventional radiology Urology  Procedures/Studies:  CT BONE TROCAR/NEEDLE BIOPSY DEEP Result Date: 11/21/2023 INDICATION: Newly diagnosed bladder cancer with multifocal osseous metastatic disease. Patient presents for attempted CT-guided biopsy of the right transverse process of T5. EXAM: CT-guided deep bone biopsy TECHNIQUE: Multidetector CT imaging of the thoracic spine was performed following the standard protocol without IV contrast. RADIATION  DOSE REDUCTION: This exam was performed according to the departmental dose-optimization program which includes automated exposure control, adjustment of the mA and/or kV according to patient size and/or use of iterative reconstruction technique. MEDICATIONS: None. ANESTHESIA/SEDATION: Moderate (conscious) sedation was employed during this procedure. A total of Versed 2 mg and Fentanyl 100 mcg was administered intravenously by the radiology nurse. Total intra-service moderate Sedation Time: 18 minutes. The patient's level of consciousness and vital signs were monitored continuously by radiology nursing throughout the procedure under my direct supervision. COMPLICATIONS: None immediate. PROCEDURE: Informed written consent was obtained from the patient after a thorough discussion of the procedural risks, benefits and alternatives. All questions were addressed. Maximal Sterile Barrier Technique was utilized including caps, mask, sterile gowns, sterile gloves, sterile drape, hand hygiene and skin antiseptic. A timeout was performed prior to the initiation of the procedure. Axial CT imaging was performed. The irregular month eaten appearance of the transverse process on the right at T5 was successfully identified. A suitable skin entry site was selected and marked. Local anesthesia was attained by infiltration with 1% lidocaine. A small dermatotomy was made. Under careful intermittent CT guidance, the 11 gauge osteo introducer was advanced and positioned against the tip of the spinous process. Several 9 gauge core biopsies were then obtained coaxially through the abnormal appearing transverse process. Biopsy specimens were placed in formalin and delivered to pathology for further analysis. The patient tolerated the procedure well. Post CT imaging demonstrates no evidence of immediate complication. IMPRESSION: Technically successful CT-guided core biopsy of the right transverse process at T5. Electronically Signed   By:  Malachy Moan M.D.   On: 11/21/2023 14:28   CT THORACIC SPINE WO CONTRAST Addendum Date: 11/20/2023 ADDENDUM REPORT: 11/20/2023 16:04 ADDENDUM: And expansile lytic lesion is noted posteriorly on the left at L1, along the posterior margin the fracture. This extends into the left pedicle and left facet. The lesion extends into the left transverse process. Note is also made of a sacral insufficiency fracture involving the sacral ala bilaterally and transversely at S2. Electronically Signed   By: Marin Roberts M.D.   On: 11/20/2023 16:04   Result  Date: 11/20/2023 CLINICAL DATA:  Mid-back pain, abnormal neuro, positive xray (Ped 0-17y); Low back pain, cauda equina syndrome suspected EXAM: CT THORACIC AND LUMBAR SPINE WITHOUT CONTRAST TECHNIQUE: Multidetector CT imaging of the thoracic and lumbar spine was performed without contrast. Multiplanar CT image reconstructions were also generated. RADIATION DOSE REDUCTION: This exam was performed according to the departmental dose-optimization program which includes automated exposure control, adjustment of the mA and/or kV according to patient size and/or use of iterative reconstruction technique. COMPARISON:  MRI lumbar spine 12/12/2008, CT chest 10/17/2023: CT abdomen pelvis 10/28/2023 FINDINGS: CT THORACIC SPINE FINDINGS Alignment: Normal. Vertebrae: Diffusely decreased bone density. Interval development of an age-indeterminate T1 compression fracture with at least 40% vertebral body height loss. Chronic stable T5, T7 compression fracture. Redemonstration of scattered sclerotic axial skeleton lesions. Multilevel mild degenerative changes of the spine. Paraspinal and other soft tissues: Negative. Disc levels: Maintained. CT LUMBAR SPINE FINDINGS Segmentation: 5 lumbar type vertebrae. Alignment: Normal. Vertebrae: Diffusely decreased bone density. No severe osseous neural foraminal or central canal stenosis. L3-L4, L4-L5, L5-S1 disc bulge with question at least  moderate central canal stenosis at the L4-L5 level. No acute fracture or focal pathologic process. Paraspinal and other soft tissues: Negative. Disc levels: Maintained. Other:. Diffuse bronchial wall thickening. Limited evaluation of the lungs due to motion artifact. Severe right hydroureteronephrosis that is partially visualized. Atherosclerotic plaque. Cardiac leads. Status post cholecystectomy. Acute minimally displaced right posterior first rib fracture (5:21). IMPRESSION: IMPRESSION CT THORACIC SPINE IMPRESSION 1. No acute displaced fracture or traumatic listhesis of the thoracic spine. 2. Redemonstration of scattered sclerotic axial skeleton lesions. Question metastases. CT LUMBAR SPINE IMPRESSION 1. No acute displaced fracture or traumatic listhesis of the lumbar spine. 2. L3-L4, L4-L5, L5-S1 disc bulge with question at least moderate central canal stenosis at the L4-L5 level. Recommend MRI for further evaluation. Other imaging findings of potential clinical significance: 1. Acute minimally displaced right posterior first rib fracture 2. Severe right hydroureteronephrosis partially visualized. Finding better evaluated on CT abdomen pelvis 10/28/2023 3.  Aortic Atherosclerosis (ICD10-I70.0). Electronically Signed: By: Tish Frederickson M.D. On: 11/14/2023 20:53   CT LUMBAR SPINE WO CONTRAST Addendum Date: 11/20/2023 ADDENDUM REPORT: 11/20/2023 16:04 ADDENDUM: And expansile lytic lesion is noted posteriorly on the left at L1, along the posterior margin the fracture. This extends into the left pedicle and left facet. The lesion extends into the left transverse process. Note is also made of a sacral insufficiency fracture involving the sacral ala bilaterally and transversely at S2. Electronically Signed   By: Marin Roberts M.D.   On: 11/20/2023 16:04   Result Date: 11/20/2023 CLINICAL DATA:  Mid-back pain, abnormal neuro, positive xray (Ped 0-17y); Low back pain, cauda equina syndrome suspected EXAM: CT  THORACIC AND LUMBAR SPINE WITHOUT CONTRAST TECHNIQUE: Multidetector CT imaging of the thoracic and lumbar spine was performed without contrast. Multiplanar CT image reconstructions were also generated. RADIATION DOSE REDUCTION: This exam was performed according to the departmental dose-optimization program which includes automated exposure control, adjustment of the mA and/or kV according to patient size and/or use of iterative reconstruction technique. COMPARISON:  MRI lumbar spine 12/12/2008, CT chest 10/17/2023: CT abdomen pelvis 10/28/2023 FINDINGS: CT THORACIC SPINE FINDINGS Alignment: Normal. Vertebrae: Diffusely decreased bone density. Interval development of an age-indeterminate T1 compression fracture with at least 40% vertebral body height loss. Chronic stable T5, T7 compression fracture. Redemonstration of scattered sclerotic axial skeleton lesions. Multilevel mild degenerative changes of the spine. Paraspinal and other soft tissues: Negative. Disc levels: Maintained.  CT LUMBAR SPINE FINDINGS Segmentation: 5 lumbar type vertebrae. Alignment: Normal. Vertebrae: Diffusely decreased bone density. No severe osseous neural foraminal or central canal stenosis. L3-L4, L4-L5, L5-S1 disc bulge with question at least moderate central canal stenosis at the L4-L5 level. No acute fracture or focal pathologic process. Paraspinal and other soft tissues: Negative. Disc levels: Maintained. Other:. Diffuse bronchial wall thickening. Limited evaluation of the lungs due to motion artifact. Severe right hydroureteronephrosis that is partially visualized. Atherosclerotic plaque. Cardiac leads. Status post cholecystectomy. Acute minimally displaced right posterior first rib fracture (5:21). IMPRESSION: IMPRESSION CT THORACIC SPINE IMPRESSION 1. No acute displaced fracture or traumatic listhesis of the thoracic spine. 2. Redemonstration of scattered sclerotic axial skeleton lesions. Question metastases. CT LUMBAR SPINE  IMPRESSION 1. No acute displaced fracture or traumatic listhesis of the lumbar spine. 2. L3-L4, L4-L5, L5-S1 disc bulge with question at least moderate central canal stenosis at the L4-L5 level. Recommend MRI for further evaluation. Other imaging findings of potential clinical significance: 1. Acute minimally displaced right posterior first rib fracture 2. Severe right hydroureteronephrosis partially visualized. Finding better evaluated on CT abdomen pelvis 10/28/2023 3.  Aortic Atherosclerosis (ICD10-I70.0). Electronically Signed: By: Tish Frederickson M.D. On: 11/14/2023 20:53   NM Bone Scan Whole Body Result Date: 11/19/2023 CLINICAL DATA:  Bladder cancer. Severe low back pain that radiates into the bilateral legs. EXAM: NUCLEAR MEDICINE WHOLE BODY BONE SCAN TECHNIQUE: Whole body anterior and posterior images were obtained approximately 3 hours after intravenous injection of radiopharmaceutical. RADIOPHARMACEUTICALS:  19.6 mCi Technetium-71m MDP IV COMPARISON:  CT chest 10/17/2023 and CT abdomen pelvis 10/28/2023 and CT spine 11/15/2023 FINDINGS: Foci of increased uptake involving the T5 and T7 vertebral bodies are compatible with the known compression fractures. Subtle areas of increased uptake along the left side of T2 and T3 may correspond with the areas of sclerosis on the previous cross-sectional imaging. Increased uptake near T1 corresponds with a compression fracture based on the CT from 11/15/2023. Focus of increased uptake involving the lateral left second rib corresponds with an area of cortical thickening and sclerosis on the previous CT. This could represent old left second rib fracture or a sclerotic lesion. Small foci involving the right eighth rib, right seventh rib and possibly the right sixth rib could be related to prior fractures based on previous CT. At least 2 areas of increased uptake involving the left side of the scapula. Difficult to exclude small left scapular lesions. Activity in the  left renal collecting system. Minimal activity in the right renal collecting system. Slightly increased uptake along the left side of the calvarium is indeterminate. Increased uptake in the lower pelvis is likely involving the urinary bladder. No suspicious uptake in the appendicular skeleton. Probable degenerative changes in both shoulders. Focus of photopenia in the left chest compatible with patient's AICD. Slightly increased uptake in the sacrum is likely related to known sacral fractures. Photopenia in the right hip compatible with a right hip replacement. IMPRESSION: 1. Scattered areas of increased uptake involving multiple thoracic vertebral bodies. Areas of increased uptake involving T1, T5 and T7 compatible with known compression fractures. Based on the CT from 11/15/2023, there is concern for a lytic lesion involving the T1 left pedicle. Concern for metastatic disease at T1. Cannot exclude metastatic disease at T2, T3, T5 and T7. 2. Small foci in the left scapula and cannot exclude metastatic bone lesions in this area. 3. Bilateral rib foci. These could be related to old rib fractures. However, the left second rib  is suspicious for metastatic disease based on the prior CT. Electronically Signed   By: Richarda Overlie M.D.   On: 11/19/2023 19:09   CT PELVIS WO CONTRAST Result Date: 11/17/2023 CLINICAL DATA:  Bladder cancer to look for metastasis. EXAM: CT PELVIS WITHOUT CONTRAST TECHNIQUE: Multidetector CT imaging of the pelvis was performed following the standard protocol without intravenous contrast. RADIATION DOSE REDUCTION: This exam was performed according to the departmental dose-optimization program which includes automated exposure control, adjustment of the mA and/or kV according to patient size and/or use of iterative reconstruction technique. COMPARISON:  CT abdomen and pelvis 10/25/2020 FINDINGS: Urinary Tract: Foley catheter decompresses the bladder. Distal left ureter appears dilated, possibly  reflux or obstruction. No stones are demonstrated. Bowel: Visualized small and large bowel are not abnormally distended. Prominent diverticulosis of the sigmoid colon. No definitive inflammatory changes to suggest acute diverticulitis. Vascular/Lymphatic: Mild calcification in the iliac arteries. No significant lymphadenopathy. Reproductive:  Uterus and ovaries are not enlarged. Other: Nonspecific soft tissue thickening in the presacral perirectal fat. This could represent a hematoma in the setting of sacral fractures. See below. Alternatively, this could represent postradiation or postsurgical change if there has been surgery or radiation to this area. Alternatively, infectious, inflammatory, or neoplastic etiologies could be considered. No discrete mass or fluid collection. Musculoskeletal: Postoperative right hip arthroplasty. Degenerative changes in the lower lumbar spine. Nondisplaced fracture of the sacrum at the level of S2. This may represent a stress fracture or insufficiency fracture. Old appearing fracture deformity of the left inferior pubic ramus. IMPRESSION: 1. Foley catheter decompresses the bladder, limiting evaluation. 2. No lymphadenopathy or soft tissue mass identified. 3. Colonic diverticulosis without evidence of acute diverticulitis. 4. Nonspecific soft tissue thickening in the presacral space. Possibly etiologies include infectious, inflammatory, postoperative/postradiation, or posttraumatic change. 5. Nondisplaced fractures of the sacrum at the level of S2, possibly stress or insufficiency fracture. No destructive bone lesions are identified. Electronically Signed   By: Burman Nieves M.D.   On: 11/17/2023 20:23   IR NEPHROSTOMY PLACEMENT RIGHT Result Date: 11/16/2023 INDICATION: 68 year old female with vaginal/bladder mass and right distal ureteral obstruction. Cystoscopy in attempted ureteral stent placement was unsuccessful. Therefore, patient requires placement of a percutaneous  nephrostomy tube. EXAM: IR NEPHROSTOMY PLACEMENT RIGHT COMPARISON:  None Available. MEDICATIONS: No additional antibiotics administered. ANESTHESIA/SEDATION: Fentanyl 2 mcg IV; Versed 100 mg IV administered by the radiology nurse Moderate Sedation Time:  10 minutes The patient's vital signs and level of consciousness were continuously monitored during the procedure by the interventional radiology nurse under my direct supervision. CONTRAST:  15mL OMNIPAQUE IOHEXOL 300 MG/ML SOLN - administered into the collecting system(s) FLUOROSCOPY: Radiation exposure index: 2 mGy reference air kerma COMPLICATIONS: None immediate. TECHNIQUE: The procedure, risks, benefits, and alternatives were explained to the patient. Questions regarding the procedure were encouraged and answered. The patient understands and consents to the procedure. The right flank was prepped with chlorhexidine in a sterile fashion, and a sterile drape was applied covering the operative field. A sterile gown and sterile gloves were used for the procedure. Local anesthesia was provided with 1% Lidocaine. The right flank was interrogated with ultrasound and the left kidney identified. The kidney is hydronephrotic. A suitable access site on the skin overlying the lower pole, posterior calix was identified. After local mg anesthesia was achieved, a small skin nick was made with an 11 blade scalpel. A 21 gauge Accustick needle was then advanced under direct sonographic guidance into the lower pole of the right kidney.  A 0.018 inch wire was advanced under fluoroscopic guidance into the left renal collecting system. The Accustick sheath was then advanced over the wire and a 0.018 system exchanged for a 0.035 system. Gentle hand injection of contrast material confirms placement of the sheath within the renal collecting system. There is severe hydronephrosis. The tract from the scan into the renal collecting system was then dilated serially to 10-French. A 10-French  Cook all-purpose drain was then placed and positioned under fluoroscopic guidance. The locking loop is well formed within the left renal pelvis. The catheter was secured to the skin with 2-0 Prolene and a sterile bandage was placed. Catheter was left to gravity bag drainage. IMPRESSION: Successful placement of a right 10 French percutaneous nephrostomy tube. Electronically Signed   By: Malachy Moan M.D.   On: 11/16/2023 15:43   DG C-Arm 1-60 Min-No Report Result Date: 11/16/2023 Fluoroscopy was utilized by the requesting physician.  No radiographic interpretation.   DG C-Arm 1-60 Min-No Report Result Date: 11/16/2023 Fluoroscopy was utilized by the requesting physician.  No radiographic interpretation.   CT THORACIC SPINE WO CONTRAST Result Date: 11/15/2023 CLINICAL DATA:  Back pain. Lower extremity weakness. Question cauda acquired syndrome. EXAM: CT MYELOGRAPHY LUMBAR SPINE TECHNIQUE: CT imaging of the thoracic and lumbar spine was performed after Isovue 300M contrast administration. Multiplanar CT image reconstructions were also generated. RADIATION DOSE REDUCTION: This exam was performed according to the departmental dose-optimization program which includes automated exposure control, adjustment of the mA and/or kV according to patient size and/or use of iterative reconstruction technique. COMPARISON:  CT of the thoracic and lumbar spine 11/14/2023. FINDINGS: Thoracic spine Alignment: No significant listhesis is present. Mild rightward curvature is centered at T6. Mild straightening of the normal thoracic kyphosis is stable. Vertebrae: The superior endplate compression fracture at L1 is more severe on the left. Slight retropulsed bone is present without focal stenosis. The superior endplate fracture at T5 demonstrates 30-40% loss of height. No retropulsed bone is present. This may be subacute. Superior endplate fracture at T7 is more acute, with 40-50% loss of height. Slight retropulsed bone is  present without significant stenosis. Progressive collapse is present at this level. Sclerotic lesions are present on the left at T2 and T3 extending into the left pedicle at T3. Conus medullaris: Extends to the  level and appears normal. Paraspinal and other soft tissues: The paraspinous soft tissues are within normal limits. The visualized lung fields are clear. Pacing wires are in place. Severe right-sided hydronephrosis is again noted. Disc levels: No significant central canal stenosis or cord compression is present. Mild right foraminal narrowing present at T8-9, T9-10 and T10-11. Mild left foraminal narrowing present at T5-6 and T6-7. Lumbar spine Segmentation: 5 non rib-bearing lumbar type vertebral bodies are present. The lowest fully formed vertebral body is L5. Alignment: No significant listhesis is present. Levoconvex curvature is centered at L1-2. Mild straightening of the normal lumbar lordosis is present. Vertebrae: Vertebral body heights are normal. No acute or healing fractures are present. Conus medullaris: Extends to the T12-L1   Level and appears normal. Paraspinal and other soft tissues: Atherosclerotic calcifications are present the aorta and branch vessels. No adenopathy is present. Severe right-sided hydronephrosis is again noted. Disc levels: L1-2: Normal disc signal and height is present. No focal protrusion or stenosis is present. L2-3: Normal disc signal and height is present. No focal protrusion or stenosis is present. L3-4: A mild broad-based disc bulge is present. Mild facet hypertrophy is noted bilaterally. Disc  material extends into the foramina bilaterally. Mild right foraminal narrowing is present. L4-5: A leftward disc protrusion is present. Moderate left subarticular and foraminal narrowing is present. Mild right foraminal narrowing is present. L5-S1: Chronic loss of disc height is present. A leftward disc protrusion likely contacts the traversing left S1 nerve roots. Facet  spurring contributes to mild foraminal narrowing bilaterally, left greater than right. IMPRESSION: 1. No significant cord compression or central canal stenosis to explain the patient's symptoms. 2. Acute superior endplate compression fracture at T7 with 40-50% loss of height. Slight retropulsed bone is present without significant stenosis. 3. Superior endplate compression fracture at T5 with 30-40% loss of height. No retropulsed bone is present. This may be subacute. 4. Superior endplate compression fracture at L1 is more severe on the left. Slight retropulsed bone is present without focal stenosis. 5. Sclerotic lesions on the left at T2 and T3 extending into the left pedicle at T3. These are concerning for metastatic disease. 6. Mild right foraminal narrowing at T8-9, T9-10 and T10-11. 7. Mild left foraminal narrowing at T5-6 and T6-7. 8. Moderate left subarticular and foraminal narrowing at L4-5. 9. Mild foraminal narrowing bilaterally at L5-S1, left greater than right. 10. Severe right-sided hydronephrosis is again noted. 11.  Aortic Atherosclerosis (ICD10-I70.0). These results were called by telephone at the time of interpretation on 11/15/2023 at 1:50 pm to Dr. Coletta Memos, Who verbally acknowledged these results. Electronically Signed   By: Marin Roberts M.D.   On: 11/15/2023 15:31   CT LUMBAR SPINE WO CONTRAST Result Date: 11/15/2023 CLINICAL DATA:  Back pain. Lower extremity weakness. Question cauda acquired syndrome. EXAM: CT MYELOGRAPHY LUMBAR SPINE TECHNIQUE: CT imaging of the thoracic and lumbar spine was performed after Isovue 300M contrast administration. Multiplanar CT image reconstructions were also generated. RADIATION DOSE REDUCTION: This exam was performed according to the departmental dose-optimization program which includes automated exposure control, adjustment of the mA and/or kV according to patient size and/or use of iterative reconstruction technique. COMPARISON:  CT of the  thoracic and lumbar spine 11/14/2023. FINDINGS: Thoracic spine Alignment: No significant listhesis is present. Mild rightward curvature is centered at T6. Mild straightening of the normal thoracic kyphosis is stable. Vertebrae: The superior endplate compression fracture at L1 is more severe on the left. Slight retropulsed bone is present without focal stenosis. The superior endplate fracture at T5 demonstrates 30-40% loss of height. No retropulsed bone is present. This may be subacute. Superior endplate fracture at T7 is more acute, with 40-50% loss of height. Slight retropulsed bone is present without significant stenosis. Progressive collapse is present at this level. Sclerotic lesions are present on the left at T2 and T3 extending into the left pedicle at T3. Conus medullaris: Extends to the  level and appears normal. Paraspinal and other soft tissues: The paraspinous soft tissues are within normal limits. The visualized lung fields are clear. Pacing wires are in place. Severe right-sided hydronephrosis is again noted. Disc levels: No significant central canal stenosis or cord compression is present. Mild right foraminal narrowing present at T8-9, T9-10 and T10-11. Mild left foraminal narrowing present at T5-6 and T6-7. Lumbar spine Segmentation: 5 non rib-bearing lumbar type vertebral bodies are present. The lowest fully formed vertebral body is L5. Alignment: No significant listhesis is present. Levoconvex curvature is centered at L1-2. Mild straightening of the normal lumbar lordosis is present. Vertebrae: Vertebral body heights are normal. No acute or healing fractures are present. Conus medullaris: Extends to the T12-L1   Level and  appears normal. Paraspinal and other soft tissues: Atherosclerotic calcifications are present the aorta and branch vessels. No adenopathy is present. Severe right-sided hydronephrosis is again noted. Disc levels: L1-2: Normal disc signal and height is present. No focal protrusion  or stenosis is present. L2-3: Normal disc signal and height is present. No focal protrusion or stenosis is present. L3-4: A mild broad-based disc bulge is present. Mild facet hypertrophy is noted bilaterally. Disc material extends into the foramina bilaterally. Mild right foraminal narrowing is present. L4-5: A leftward disc protrusion is present. Moderate left subarticular and foraminal narrowing is present. Mild right foraminal narrowing is present. L5-S1: Chronic loss of disc height is present. A leftward disc protrusion likely contacts the traversing left S1 nerve roots. Facet spurring contributes to mild foraminal narrowing bilaterally, left greater than right. IMPRESSION: 1. No significant cord compression or central canal stenosis to explain the patient's symptoms. 2. Acute superior endplate compression fracture at T7 with 40-50% loss of height. Slight retropulsed bone is present without significant stenosis. 3. Superior endplate compression fracture at T5 with 30-40% loss of height. No retropulsed bone is present. This may be subacute. 4. Superior endplate compression fracture at L1 is more severe on the left. Slight retropulsed bone is present without focal stenosis. 5. Sclerotic lesions on the left at T2 and T3 extending into the left pedicle at T3. These are concerning for metastatic disease. 6. Mild right foraminal narrowing at T8-9, T9-10 and T10-11. 7. Mild left foraminal narrowing at T5-6 and T6-7. 8. Moderate left subarticular and foraminal narrowing at L4-5. 9. Mild foraminal narrowing bilaterally at L5-S1, left greater than right. 10. Severe right-sided hydronephrosis is again noted. 11.  Aortic Atherosclerosis (ICD10-I70.0). These results were called by telephone at the time of interpretation on 11/15/2023 at 1:50 pm to Dr. Coletta Memos, Who verbally acknowledged these results. Electronically Signed   By: Marin Roberts M.D.   On: 11/15/2023 15:31   DG MYELOGRAPHY LUMBAR INJ MULTI  REGION Result Date: 11/15/2023 CLINICAL DATA:  68 year old female with history of back pain, lower extremity weakness. Contrast injection for CT myelogram requested. EXAM: LUMBAR MYELOGRAM FLUOROSCOPY: dictate in minutes and seconds PROCEDURE: After thorough discussion of risks and benefits of the procedure including bleeding, infection, injury to nerves, blood vessels, adjacent structures as well as headache and CSF leak, written and oral informed consent was obtained. Time out form was completed. Patient was positioned prone on the fluoroscopy table. Local anesthesia was provided with 1% lidocaine without epinephrine after prepped and draped in the usual sterile fashion. Puncture was performed at L2-L3 using a 3 1/2 inch 20-gauge spinal needle via left approach. Using a single pass through the dura, the needle was placed within the thecal sac, with return of clear CSF. 15 mL of Omnipaque 180 was injected into the thecal sac, with normal opacification of the nerve roots and cauda equina consistent with free flow within the subarachnoid space. FINDINGS: Contiguous axial images were obtained through the Lumbar spine after the intrathecal infusion with appropriate contrast dispersal. IMPRESSION: Successful intrathecal administration of contrast for CT myelogram. Performed by: Loyce Dys PA-C Supervised by Dr. Chryl Heck, MD Electronically Signed   By: Malachy Moan M.D.   On: 11/15/2023 14:34   DG Chest 2 View Result Date: 11/06/2023 CLINICAL DATA:  Chest pain.  COPD. EXAM: CHEST - 2 VIEW COMPARISON:  October 10, 2023. FINDINGS: Stable cardiomediastinal silhouette. Left-sided defibrillator is unchanged. Both lungs are clear. The visualized skeletal structures are unremarkable. IMPRESSION:  No active cardiopulmonary disease. Electronically Signed   By: Lupita Raider M.D.   On: 11/06/2023 17:09    Subjective: - no chest pain, shortness of breath, no abdominal pain, nausea or vomiting.    Discharge Exam: BP 116/69 (BP Location: Left Arm)   Pulse 71   Temp 98.4 F (36.9 C) (Oral)   Resp 17   Ht 4\' 10"  (1.473 m)   Wt 73.2 kg   SpO2 98%   BMI 33.73 kg/m   General: Pt is alert, awake, not in acute distress Cardiovascular: RRR, S1/S2 +, no rubs, no gallops Respiratory: CTA bilaterally, no wheezing, no rhonchi Abdominal: Soft, NT, ND, bowel sounds + Extremities: no edema, no cyanosis    The results of significant diagnostics from this hospitalization (including imaging, microbiology, ancillary and laboratory) are listed below for reference.     Microbiology: No results found for this or any previous visit (from the past 240 hours).   Labs: Basic Metabolic Panel: Recent Labs  Lab 11/18/23 1331 11/19/23 0413 11/20/23 0413 11/21/23 0341 11/23/23 0353  Crystal 140 140 137 136 138  K 4.4 4.6 4.3 4.3 4.1  CL 112* 114* 110 106 105  CO2 20* 19* 18* 21* 25  GLUCOSE 90 124* 115* 127* 121*  BUN 45* 38* 26* 26* 21  CREATININE 1.83* 1.57* 1.31* 1.39* 1.38*  CALCIUM 8.5* 8.4* 8.5* 8.5* 8.4*  MG  --   --   --  1.6* 1.7  PHOS  --   --   --  2.4*  --    Liver Function Tests: Recent Labs  Lab 11/21/23 0341 11/23/23 0353  AST 14* 18  ALT 10 12  ALKPHOS 81 90  BILITOT 0.3 0.4  PROT 5.2* 4.8*  ALBUMIN 2.5* 2.5*   CBC: Recent Labs  Lab 11/19/23 0413 11/21/23 0341 11/23/23 0353  WBC 10.7* 10.9* 14.7*  NEUTROABS 8.0* 8.7*  --   HGB 10.2* 10.6* 10.1*  HCT 33.7* 33.3* 31.9*  MCV 98.0 92.2 92.5  PLT 287 268 248   CBG: Recent Labs  Lab 11/21/23 1941  GLUCAP 282*   Hgb A1c No results for input(s): "HGBA1C" in the last 72 hours. Lipid Profile No results for input(s): "CHOL", "HDL", "LDLCALC", "TRIG", "CHOLHDL", "LDLDIRECT" in the last 72 hours. Thyroid function studies No results for input(s): "TSH", "T4TOTAL", "T3FREE", "THYROIDAB" in the last 72 hours.  Invalid input(s): "FREET3" Urinalysis    Component Value Date/Time   COLORURINE YELLOW  11/14/2023 2000   APPEARANCEUR HAZY (A) 11/14/2023 2000   LABSPEC 1.014 11/14/2023 2000   PHURINE 5.0 11/14/2023 2000   GLUCOSEU NEGATIVE 11/14/2023 2000   HGBUR NEGATIVE 11/14/2023 2000   BILIRUBINUR NEGATIVE 11/14/2023 2000   BILIRUBINUR negative 08/15/2021 1537   KETONESUR 5 (A) 11/14/2023 2000   PROTEINUR NEGATIVE 11/14/2023 2000   UROBILINOGEN 0.2 08/15/2021 1537   UROBILINOGEN 1.0 03/26/2015 1441   NITRITE NEGATIVE 11/14/2023 2000   LEUKOCYTESUR TRACE (A) 11/14/2023 2000    FURTHER DISCHARGE INSTRUCTIONS:   Get Medicines reviewed and adjusted: Please take all your medications with you for your next visit with your Primary MD   Laboratory/radiological data: Please request your Primary MD to go over all hospital tests and procedure/radiological results at the follow up, please ask your Primary MD to get all Hospital records sent to his/her office.   In some cases, they will be blood work, cultures and biopsy results pending at the time of your discharge. Please request that your primary care M.D. goes  through all the records of your hospital data and follows up on these results.   Also Note the following: If you experience worsening of your admission symptoms, develop shortness of breath, life threatening emergency, suicidal or homicidal thoughts you must seek medical attention immediately by calling 911 or calling your MD immediately  if symptoms less severe.   You must read complete instructions/literature along with all the possible adverse reactions/side effects for all the Medicines you take and that have been prescribed to you. Take any new Medicines after you have completely understood and accpet all the possible adverse reactions/side effects.    Do not drive when taking Pain medications or sleeping medications (Benzodaizepines)   Do not take more than prescribed Pain, Sleep and Anxiety Medications. It is not advisable to combine anxiety,sleep and pain medications without  talking with your primary care practitioner   Special Instructions: If you have smoked or chewed Tobacco  in the last 2 yrs please stop smoking, stop any regular Alcohol  and or any Recreational drug use.   Wear Seat belts while driving.   Please note: You were cared for by a hospitalist during your hospital stay. Once you are discharged, your primary care physician will handle any further medical issues. Please note that NO REFILLS for any discharge medications will be authorized once you are discharged, as it is imperative that you return to your primary care physician (or establish a relationship with a primary care physician if you do not have one) for your post hospital discharge needs so that they can reassess your need for medications and monitor your lab values.  Time coordinating discharge: 35 minutes  SIGNED:  Pamella Pert, MD, PhD 11/24/2023, 10:37 AM

## 2023-11-24 NOTE — TOC Transition Note (Signed)
 Transition of Care [facility]) - Discharge Note  Patient Details  Name: [name] MRN: [id number] Date of Birth: 05/09/1956  [facility] CM/SW Contact:  Ewing Schlein, LCSW Phone Number: 11/24/2023, 11:33 AM  Clinical Narrative: Patient has insurance approval for SNF. Reerence ID # is: [id number]. Patient is approved for 11/24/2023-11/26/2023. CSW spoke with Irving Burton in admissions in [facility] and confirmed the patient can be admitted today. CSW spoke with daughter as she had questions about SNF. Daughter asked about [facility] as it is closer, but the referral was declined by the facility. Patient and daughter are agreeable to proceeding with [facility] at this time.  The number for report is [phone]. Discharge summary, discharge orders, and SNF transfer report faxed to facility in hub. Medical necessity form done; PTAR scheduled. Discharge packet completed. RN updated. TOC signing off.  Final next level of care: Skilled Nursing Facility Barriers to Discharge: Barriers Resolved  Patient Goals and CMS Choice Patient states their goals for this hospitalization and ongoing recovery are:: SNf to get stonger CMS Medicare.gov Compare Post Acute Care list provided to:: Patient Choice offered to / list presented to : Patient  Discharge Placement Existing PASRR number confirmed : 11/17/23          Patient [facility] bed at: [facility] Patient to be transferred to facility by: PTAR Name of family member notified: Otilio Miu (daughter) Patient and family notified of of transfer: 11/24/23  Discharge Plan and Services Additional resources added to the After Visit Summary for       DME Arranged: N/A DME Agency: NA  Social Drivers of Health (SDOH) Interventions SDOH Screenings   Food Insecurity: No Food Insecurity (11/15/2023)  Housing: Low Risk  (11/15/2023)  Transportation Needs: Unmet Transportation Needs (11/15/2023)  Utilities: Not At Risk (11/15/2023)  Alcohol Screen:  Low Risk  (12/31/2022)  Depression (PHQ2-9): High Risk (09/25/2023)  Financial Resource Strain: Low Risk  (04/05/2023)  Physical Activity: Insufficiently Active (04/05/2023)  Social Connections: Moderately Isolated (11/15/2023)  Stress: Stress Concern Present (04/05/2023)  Tobacco Use: Medium Risk (11/16/2023)  Health Literacy: Adequate Health Literacy (04/07/2023)   Readmission Risk Interventions    11/24/2023   11:33 AM  Readmission Risk Prevention Plan  Transportation Screening Complete  Medication Review (RN Care Manager) Complete  HRI or Home Care Consult Complete  SW Recovery Care/Counseling Consult Complete  Palliative Care Screening Not Applicable  Skilled Nursing Facility Complete

## 2023-11-25 ENCOUNTER — Encounter (HOSPITAL_COMMUNITY): Payer: Self-pay

## 2023-11-25 ENCOUNTER — Other Ambulatory Visit: Payer: Self-pay | Admitting: Family Medicine

## 2023-11-25 ENCOUNTER — Other Ambulatory Visit: Payer: Self-pay

## 2023-11-25 ENCOUNTER — Emergency Department (HOSPITAL_COMMUNITY)
Admission: EM | Admit: 2023-11-25 | Discharge: 2023-11-25 | Disposition: A | Discharge status: Home / Self Care | Attending: Emergency Medicine | Admitting: Emergency Medicine

## 2023-11-25 DIAGNOSIS — J449 Chronic obstructive pulmonary disease, unspecified: Secondary | ICD-10-CM | POA: Diagnosis not present

## 2023-11-25 DIAGNOSIS — G893 Neoplasm related pain (acute) (chronic): Secondary | ICD-10-CM | POA: Insufficient documentation

## 2023-11-25 DIAGNOSIS — Z79899 Other long term (current) drug therapy: Secondary | ICD-10-CM | POA: Diagnosis not present

## 2023-11-25 DIAGNOSIS — Z7982 Long term (current) use of aspirin: Secondary | ICD-10-CM | POA: Diagnosis not present

## 2023-11-25 DIAGNOSIS — M549 Dorsalgia, unspecified: Secondary | ICD-10-CM | POA: Diagnosis present

## 2023-11-25 DIAGNOSIS — M545 Low back pain, unspecified: Secondary | ICD-10-CM | POA: Diagnosis not present

## 2023-11-25 DIAGNOSIS — I2510 Atherosclerotic heart disease of native coronary artery without angina pectoris: Secondary | ICD-10-CM | POA: Insufficient documentation

## 2023-11-25 LAB — CBC WITH DIFFERENTIAL/PLATELET
Abs Immature Granulocytes: 0.34 10*3/uL — ABNORMAL HIGH (ref 0.00–0.07)
Basophils Absolute: 0 10*3/uL (ref 0.0–0.1)
Basophils Relative: 0 %
Eosinophils Absolute: 0.2 10*3/uL (ref 0.0–0.5)
Eosinophils Relative: 1 %
HCT: 34.8 % — ABNORMAL LOW (ref 36.0–46.0)
Hemoglobin: 11.2 g/dL — ABNORMAL LOW (ref 12.0–15.0)
Immature Granulocytes: 2 %
Lymphocytes Relative: 21 %
Lymphs Abs: 3.5 10*3/uL (ref 0.7–4.0)
MCH: 29.4 pg (ref 26.0–34.0)
MCHC: 32.2 g/dL (ref 30.0–36.0)
MCV: 91.3 fL (ref 80.0–100.0)
Monocytes Absolute: 1.3 10*3/uL — ABNORMAL HIGH (ref 0.1–1.0)
Monocytes Relative: 8 %
Neutro Abs: 11.5 10*3/uL — ABNORMAL HIGH (ref 1.7–7.7)
Neutrophils Relative %: 68 %
Platelets: 267 10*3/uL (ref 150–400)
RBC: 3.81 MIL/uL — ABNORMAL LOW (ref 3.87–5.11)
RDW: 14.2 % (ref 11.5–15.5)
WBC: 16.8 10*3/uL — ABNORMAL HIGH (ref 4.0–10.5)
nRBC: 0 % (ref 0.0–0.2)

## 2023-11-25 LAB — SURGICAL PATHOLOGY

## 2023-11-25 LAB — BASIC METABOLIC PANEL WITH GFR
Anion gap: 12 (ref 5–15)
BUN: 22 mg/dL (ref 8–23)
CO2: 27 mmol/L (ref 22–32)
Calcium: 8.8 mg/dL — ABNORMAL LOW (ref 8.9–10.3)
Chloride: 100 mmol/L (ref 98–111)
Creatinine, Ser: 1.52 mg/dL — ABNORMAL HIGH (ref 0.44–1.00)
GFR, Estimated: 37 mL/min — ABNORMAL LOW (ref >60)
Glucose, Bld: 86 mg/dL (ref 70–99)
Potassium: 3.3 mmol/L — ABNORMAL LOW (ref 3.5–5.1)
Sodium: 139 mmol/L (ref 135–145)

## 2023-11-25 MED ORDER — BUPRENORPHINE HCL 2 MG SL SUBL
1.0000 mg | SUBLINGUAL_TABLET | Freq: Once | SUBLINGUAL | Status: AC
  Administered 2023-11-25: 1 mg via SUBLINGUAL
  Filled 2023-11-25: qty 1

## 2023-11-25 MED ORDER — BELBUCA 900 MCG BU FILM: 900.0000 ug | ORAL_FILM | Freq: Two times a day (BID) | BUCCAL | 0 refills | Status: AC

## 2023-11-25 MED ORDER — HYDROMORPHONE HCL 1 MG/ML IJ SOLN
0.5000 mg | INTRAMUSCULAR | Status: DC | PRN
  Administered 2023-11-25: 0.5 mg via INTRAVENOUS
  Filled 2023-11-25: qty 1

## 2023-11-25 MED ORDER — BUPRENORPHINE HCL 2 MG SL SUBL: 1.0000 mg | SUBLINGUAL_TABLET | Freq: Once | SUBLINGUAL | Status: DC

## 2023-11-25 MED ORDER — OXYCODONE HCL 15 MG PO TABS: 15.0000 mg | ORAL_TABLET | Freq: Three times a day (TID) | ORAL | 0 refills | Status: DC | PRN

## 2023-11-25 MED ORDER — OXYCODONE HCL 5 MG PO TABS
10.0000 mg | ORAL_TABLET | Freq: Once | ORAL | Status: AC
  Administered 2023-11-25: 10 mg via ORAL
  Filled 2023-11-25: qty 2

## 2023-11-25 MED ORDER — HYDROMORPHONE HCL 1 MG/ML IJ SOLN
0.5000 mg | Freq: Once | INTRAMUSCULAR | Status: AC
  Administered 2023-11-25: 0.5 mg via INTRAVENOUS
  Filled 2023-11-25: qty 1

## 2023-11-25 NOTE — ED Triage Notes (Signed)
 Pt came in via EMS from [facility] w/ c/o 9/10 of leg and back pain. Pt was seen yesterday for same but was discharged with pain medications. Facility was unable to fill script and provider at facility requested she be sent out again. Pt has bladder cancer. Two spots found on spine in which they are waiting on biopsy results. Currently not receiving chemo/radiation.

## 2023-11-25 NOTE — Discharge Instructions (Signed)
 1.  Hardcopy prescriptions are included in your discharge instructions for buprenorphine and oxycodone.  Your nursing home facility should fill these and administer.  You should have a physician managing the ongoing pain management regimen for your cancer pain.

## 2023-11-25 NOTE — ED Provider Notes (Signed)
 [place] EMERGENCY DEPARTMENT AT [facility] Provider Note  CSN: [id number] Arrival date & time: 11/25/23 9518  Chief Complaint(s) Back Pain and Leg Pain  HPI [name] is a 68 y.o. 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  She was sent to rehab facility and returns today for persistent pain.  Patient has a history of chronic back pain and is on buprenorphine as well as oxycodone.  Her pain is related to her chronic back pain.  Not related to the T5 biopsy.  During discharge, paper scripts were printed for her pain medicine the facility does not have them.  She was given Norco that provided no relief.  That prompting her visit.  No fall or trauma.  No acute issues reported.  HPI  Past Medical History Past Medical History:  Diagnosis Date   Abdominal pain 10/17/2020   Abnormality of gait due to impairment of balance 11/22/2020   Acute diverticulitis 10/17/2020   Admission for long-term opiate analgesic use 10/24/2019   Arthritis of right hip 05/29/2016   Formatting of this note might be different from the original. Added automatically from request for surgery [id number]   BMI 26.0-26.9,adult 03/29/2020   Bronchial asthma    Cardiomyopathy (HCC)    Overview:  Ejection fraction 45% in 2015 Ejection fraction 30 to 35% in November 2018   Chronic back pain    Chronic constipation 07/31/2021   Chronic hypoxemic respiratory failure (HCC) 10/24/2019   Chronic narcotic use 03/23/2015   Chronic pain of both knees 12/21/2018   Added automatically from request for surgery [id number]  Formatting of this note might be different from the original. 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   Hypotension 05/17/2020   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   Ileus following gastrointestinal surgery (HCC) 03/26/2015   Major depressive disorder, single episode, moderate (HCC) 10/24/2019   Malnutrition of moderate degree (HCC) 10/20/2020   Mesenteric ischemia (HCC) 10/16/2020   Migraine without aura with status migrainosus 10/24/2019   Mixed incontinence 10/20/2020   Myocardial infarction (HCC)    Nausea & vomiting    Obstructive chronic bronchitis with exacerbation (HCC) 10/25/2019   Other spondylosis with radiculopathy, lumbar region 10/24/2019   Persistent vomiting 05/17/2020   Presence of left artificial hip joint 10/24/2019   PTSD (post-traumatic stress disorder)    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 03/23/2015 04/19/2014   Senile osteoporosis 10/24/2019   Sepsis (HCC) 10/17/2020   Serosanguineous chronic otitis media of right ear 08/02/2021   Small bowel obstruction (HCC)    Thyroid disease    Upper respiratory tract infection  due to COVID-19 virus 07/06/2021   Wellness examination 03/27/2021   Patient Active Problem List   Diagnosis Date Noted   Low vitamin B12 level 11/21/2023   Dietary folate deficiency anemia 11/21/2023   Iron deficiency 11/21/2023    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   Bone lesion 11/18/2023   AKI (acute kidney injury) (HCC) 11/14/2023   Paraplegia (HCC) 11/14/2023   Renal lesion 11/05/2023   Urge incontinence of urine 11/03/2023   Moderate protein-calorie malnutrition (HCC) 11/03/2023   Community acquired pneumonia of left lower lobe of lung 09/25/2023   Vitamin D deficiency 09/25/2023   Elevated serum glucose 06/23/2023   Fatigue 06/23/2023   Normocytic anemia 06/23/2023   Bilateral lower extremity edema 06/07/2023   Anemia of chronic disease 06/07/2023   Rheumatoid arthritis involving multiple sites (HCC) 04/07/2023   Arthralgia of both hands 04/07/2023   COPD exacerbation (HCC) 01/04/2023   Therapeutic opioid induced constipation 01/04/2023   Chronic active hepatitis (HCC) 01/04/2023   Migraine without aura and without status migrainosus, not intractable 01/04/2023   Primary insomnia 01/04/2023   Diverticulosis 06/25/2021   Abnormality of gait due to impairment of balance 11/22/2020   Mixed incontinence 10/20/2020   Malnutrition of moderate degree (HCC) 10/20/2020   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   Mesenteric ischemia (HCC) 10/16/2020   PTSD (post-traumatic stress disorder)    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HCC) 07/13/2020   BMI 26.0-26.9,adult 03/29/2020   Admission for long-term opiate analgesic use 10/24/2019   Presence of left artificial hip joint 10/24/2019   Osteoporosis 10/24/2019   Chronic hypoxemic respiratory failure (HCC) 10/24/2019   Other spondylosis with radiculopathy, lumbar region 10/24/2019   Depression, major, recurrent, moderate (HCC) 10/24/2019   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   Chronic pain of both knees 12/21/2018   Chronic systolic congestive heart failure, NYHA class 2 (HCC) 06/19/2017   Dilated cardiomyopathy (HCC) 06/19/2017   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 Authorizing Provider  ARIPiprazole (ABILIFY) 5 MG tablet TAKE 1 TABLET BY MOUTH DAILY 10/07/23   Cox, Fritzi Mandes, MD  aspirin 81 MG tablet Take 81 mg by mouth daily.    [provider]  BREO ELLIPTA 100-25 MCG/ACT AEPB INHALE 1 PUFF INTO THE LUNGS DAILY. Patient taking differently: Inhale 1 puff into the lungs daily as needed. 06/03/23   Langley Gauss, PA  Buprenorphine HCl (BELBUCA) 900 MCG FILM Place 900 mcg inside cheek every 12 (twelve) hours for 5 days. 11/25/23 11/30/23  Nira Conn, MD  carvedilol (COREG) 3.125 MG tablet TAKE ONE (1) TABLET BY MOUTH TWICE DAILY WITH MEALS Patient taking differently: Take 3.125 mg by mouth 2 (two) times daily with a meal. 06/02/23   Cox, Kirsten, MD  cyanocobalamin 1000 MCG tablet Take 1 tablet (1,000 mcg total) by mouth daily. 11/25/23   Leatha Gilding, MD  denosumab (PROLIA) 60 MG/ML SOSY injection Inject 60 mg into the skin every 6 (six) months. 02/14/20   Brent Bulla  Ramon Dredge, MD  dexamethasone (DECADRON) 6 MG tablet Take 1 tablet (6 mg total) by mouth daily. 11/24/23   Leatha Gilding, MD  dextromethorphan 15 MG/5ML syrup Take 10 mLs (30 mg total) by mouth 4 (four) times daily as needed for cough. Patient not taking: Reported on 11/14/2023 10/22/23   [name], FNP  diclofenac Sodium (VOLTAREN) 1 % GEL Apply 2 g topically 4 (four) times daily. Patient taking differently: Apply 2 g topically as needed (Pain). 05/01/21   Abigail Miyamoto, MD  EPINEPHRINE 0.3 mg/0.3 mL IJ SOAJ injection Inject 0.3 mLs (0.3 mg total) into the muscle as needed for anaphylaxis. 04/27/20   Abigail Miyamoto, MD  esomeprazole (NEXIUM) 20 MG capsule TAKE ONE (1) CAPSULE BY MOUTH ONCE DAILY 08/28/23   Cox, Kirsten, MD  FARXIGA 5 MG TABS tablet Take 5 mg by mouth daily. 10/10/23   [provider]  ferrous sulfate 325 (65 FE) MG tablet Take 1 tablet (325 mg total) by mouth at bedtime. 11/24/23   Leatha Gilding, MD  fluticasone (FLONASE) 50 MCG/ACT nasal spray USE 2 SPRAYS IN EACH NOSTRILS DAILY AS NEEDED Patient taking differently: Place 2 sprays into both nostrils daily as needed for allergies or rhinitis. 07/18/23   [name], FNP  folic acid (FOLVITE) 1 MG tablet Take 1 tablet (1 mg total) by mouth daily. 11/25/23   Leatha Gilding, MD  furosemide (LASIX) 40 MG tablet Take 1 tablet (40 mg total) by mouth daily. 06/25/23 11/14/23  Georgeanna Lea, MD  Galcanezumab-gnlm (EMGALITY) 120 MG/ML SOSY Inject 120 mg into the skin every 30 (thirty) days. 01/06/23   Cox, Fritzi Mandes, MD  ipratropium-albuterol (DUONEB) 0.5-2.5 (3) MG/3ML SOLN USE 1 VIAL VIA NEBULIZER EVERY 6 HOURS AS NEEDED FOR SHORTNESS OF BREATH 10/16/23   [name], FNP  levocetirizine (XYZAL) 5 MG tablet Take 5 mg by mouth daily as needed for allergies. 12/13/19   [provider]  lidocaine (LIDODERM) 5 % Place 1 patch onto the skin daily. Remove & Discard patch within 12 hours or as directed by MD 11/24/23   Leatha Gilding, MD  lubiprostone (AMITIZA) 24 MCG capsule TAKE 1 CAPSULE(24 MCG) BY MOUTH TWICE DAILY WITH A MEAL 09/15/23   Cox, Kirsten, MD  megestrol (MEGACE) 20 MG tablet Take 1 tablet (20 mg total) by mouth daily. 06/19/23   CoxFritzi Mandes, MD  Multiple Vitamin (MULTIVITAMIN WITH MINERALS) TABS tablet Take 1 tablet by mouth daily. 10/30/20   Rodolph Bong, MD  naloxone [facility]) nasal spray 4 mg/0.1 mL Place 1 spray into the nose once. 01/12/21   [provider]  nitroGLYCERIN (NITROSTAT) 0.4 MG SL tablet Place 1 tablet (0.4 mg total) under the tongue every 5 (five) minutes as needed for chest pain. 11/27/21   Georgeanna Lea, MD  ondansetron (ZOFRAN) 4 MG tablet Take 1 tablet (4 mg total) by mouth every 8 (eight) hours as needed for nausea or vomiting. 05/17/20   Abigail Miyamoto, MD  oxyCODONE (ROXICODONE) 15 MG immediate release tablet Take 1 tablet (15 mg total) by mouth every 8 (eight) hours as needed for pain. 11/25/23   Tacha Manni, Amadeo Garnet, MD  potassium chloride (KLOR-CON) 10 MEQ tablet Take 3 tablets (30 mEq total) by mouth daily for 7 days. 10/29/23 11/14/23  Camnitz, Andree Coss, MD  ranolazine (RANEXA) 500 MG 12 hr tablet TAKE 1 TABLET BY MOUTH TWICE DAILY 10/28/23   Georgeanna Lea, MD  rosuvastatin (  CRESTOR) 20 MG tablet TAKE 1 TABLET BY MOUTH ONCE DAILY 08/28/23   Cox, Kirsten, MD  sertraline (ZOLOFT) 100 MG tablet TAKE 1/2 TABLET(50 MG) BY MOUTH DAILY 08/24/23   Cox, Kirsten, MD  tiZANidine (ZANAFLEX) 4 MG tablet Take 4 mg by mouth at bedtime. 11/09/20   [provider]  topiramate (TOPAMAX) 50 MG tablet TAKE TWO (2) TABLETS BY MOUTH EVERY DAY AT BEDTIME Patient taking differently: Take 100 mg by mouth at bedtime. TAKE TWO (2) TABLETS BY MOUTH EVERY DAY AT BEDTIME 06/02/23   Cox, Kirsten, MD  Ubrogepant (UBRELVY) 100 MG TABS Take 1 tablet (100 mg total) by mouth as needed (May repeat after 2 hours.  Maximum 2 tablets in 24 hours). TAKE 1 TABLET BY MOUTH BY MOUTH AS NEEDED( MAY REPEAT 1 TABLET AFTER 2 HOURS IF NEEDED, MAXIMUM 2 TABLETS IN 24 HOURS) Strength: 100 mg 04/07/23   Cox, Kirsten, MD  Vitamin D, Ergocalciferol, (DRISDOL) 1.25 MG (50000 UNIT) CAPS capsule TAKE 1 CAPSULE BY MOUTH EVERY 7 DAYS FOR 12 DOSES Patient taking differently: Take 50,000 Units by mouth every 7 (seven) days. Patient usually takes this medication on mondays 06/30/23   Blane Ohara, MD                                                                                                                                     Allergies Patient has no known allergies.  Review of Systems Review of Systems As noted in HPI  Physical Exam Vital Signs  I have reviewed the triage vital signs BP 133/74   Pulse 74   Temp 98 F (36.7 C) (Oral)   Resp 17   SpO2 95%   Physical Exam Vitals reviewed.  Constitutional:      General: She is not in acute distress.    Appearance: She is well-developed. She is not diaphoretic.  HENT:     Head: Normocephalic and atraumatic.     Right Ear: External ear normal.     Left Ear: External ear normal.     Nose: Nose normal.  Eyes:     General: No scleral icterus.    Conjunctiva/sclera: Conjunctivae normal.  Neck:     Trachea: Phonation normal.  Cardiovascular:     Rate and Rhythm: Normal rate and regular rhythm.  Pulmonary:     Effort: Pulmonary effort is normal. No respiratory distress.     Breath sounds: No stridor.  Abdominal:     General: There is no distension.  Musculoskeletal:        General: Normal range of motion.     Cervical back: Normal range of motion.     Lumbar back: Tenderness present.       Back:  Neurological:     Mental Status: She is alert and oriented to person, place, and time.  Psychiatric:        Behavior: Behavior normal.  ED Results and Treatments Labs (all labs ordered are listed, but only abnormal results are displayed) Labs Reviewed  CBC WITH DIFFERENTIAL/PLATELET - Abnormal; Notable for the following components:      Result Value   WBC 16.8 (*)    RBC 3.81 (*)    Hemoglobin 11.2 (*)    HCT 34.8 (*)    Neutro Abs 11.5 (*)    Monocytes Absolute 1.3 (*)    Abs Immature Granulocytes 0.34 (*)    All other components within normal limits  BASIC METABOLIC PANEL WITH GFR - Abnormal; Notable for the following components:   Potassium 3.3 (*)    Creatinine, Ser 1.52 (*)    Calcium 8.8 (*)    GFR, Estimated 37 (*)    All other components within normal  limits                                                                                                                         EKG  EKG Interpretation Date/Time:    Ventricular Rate:    PR Interval:    QRS Duration:    QT Interval:    QTC Calculation:   R Axis:      Text Interpretation:         Radiology No results found.  Medications Ordered in ED Medications  HYDROmorphone (DILAUDID) injection 0.5 mg (0.5 mg Intravenous Given 11/25/23 0700)  oxyCODONE (Oxy IR/ROXICODONE) immediate release tablet 10 mg (10 mg Oral Given 11/25/23 1610)   Procedures Procedures  (including critical care time) Medical Decision Making / ED Course   Medical Decision Making Amount and/or Complexity of Data Reviewed Labs: ordered. Decision-making details documented in ED Course.  Risk Prescription drug management.    Chronic back pain not controlled 2/2 not having her home pain meds. I called facility at [phone]. They report that they are waiting for the morning DNO, Donald Pore, to return in am to check and see if her prescription so there.  If not the prescription will need to be reprinted or faxed to [facility] pharmacy.  TOC consult to assist in making sure meds were obtained before sending her back to sNF.  Patient care turned over to oncoming provider. Patient case and results discussed in detail; please see their note for further ED managment.       Final Clinical Impression(s) / ED Diagnoses Final diagnoses:  None    This chart was dictated using voice recognition software.  Despite best efforts to proofread,  errors can occur which can change the documentation meaning.    Nira Conn, MD 11/25/23 [phone]

## 2023-11-25 NOTE — ED Provider Notes (Signed)
 D/C from admission yesterday. IR Bx at T5, pain medications not picked up, no pain meds overnight at facility, needs pain control. Donald Pore confirming they have scripts, if not fax to [facility]. 1 [phone]. TOC consult has been placed. Return to facility. Physical Exam  BP 121/71   Pulse 76   Temp 98 F (36.7 C) (Oral)   Resp 16   SpO2 99%   Physical Exam  Procedures  Procedures  ED Course / MDM    Medical Decision Making Amount and/or Complexity of Data Reviewed Labs: ordered.  Risk Prescription drug management.          Arby Barrette, MD 11/25/23 [phone]

## 2023-11-25 NOTE — Progress Notes (Addendum)
 11:40am: CSW spoke with [place] at [facility] who states the agency cannot accept any additional UHC's at this time.  CSW spoke with patient's daughter Earley Brooke to inform her of updates. Haylynn states no other preference on agency for [facility]. Haylynn requested CSW fax patient's AVS to Dr. Kathrynn Speed at [facility] in [place] (fax #[phone]).  CSW spoke with Adele Dan at [facility] who states the agency can accept patient for services. Agency staff will contact patient and her daughter directly to initiate services.  10:50am: CSW spoke with patient's daughter Earley Brooke to discuss her concerns regarding patient returning to [facility]. Haylynn states she would prefer patient to return home with her as she is already acting as her home health nurse. Haylynn states she is an Media pl[employer] and would prefer to use that company for home health PT and OT.  CSW sent message to [facility] [place] to determine if agency can provide patient with services - waiting response.  10:15am: CSW received return call from [facility] who states if patient can receive her dose of pain medication prior to discharge, the facility will have the medications in hand by the time the next dose is due.  CSW informed RN of information.  Patient will go to [facility] via [place] - RN to call when ready. The number for report is [phone].  8:55am: CSW received call from [place] at [facility] who states she will contact the DON at the facility and return call to CSW.  8:20am: CSW attempted to reach [place] at [facility] without success - a voicemail was left requesting a return call.  Edwin Dada, MSW, LCSW Transitions of Care  Clinical Social Worker II [phone]

## 2023-11-26 ENCOUNTER — Telehealth: Payer: Self-pay

## 2023-11-26 DIAGNOSIS — G8220 Paraplegia, unspecified: Secondary | ICD-10-CM | POA: Diagnosis not present

## 2023-11-26 DIAGNOSIS — N1330 Unspecified hydronephrosis: Secondary | ICD-10-CM | POA: Diagnosis not present

## 2023-11-26 DIAGNOSIS — N179 Acute kidney failure, unspecified: Secondary | ICD-10-CM | POA: Diagnosis not present

## 2023-11-26 DIAGNOSIS — J449 Chronic obstructive pulmonary disease, unspecified: Secondary | ICD-10-CM | POA: Diagnosis not present

## 2023-11-26 DIAGNOSIS — M899 Disorder of bone, unspecified: Secondary | ICD-10-CM | POA: Diagnosis not present

## 2023-11-26 DIAGNOSIS — D72829 Elevated white blood cell count, unspecified: Secondary | ICD-10-CM | POA: Diagnosis not present

## 2023-11-26 DIAGNOSIS — D520 Dietary folate deficiency anemia: Secondary | ICD-10-CM | POA: Diagnosis not present

## 2023-11-26 DIAGNOSIS — I420 Dilated cardiomyopathy: Secondary | ICD-10-CM | POA: Diagnosis not present

## 2023-11-26 DIAGNOSIS — M47814 Spondylosis without myelopathy or radiculopathy, thoracic region: Secondary | ICD-10-CM | POA: Diagnosis not present

## 2023-11-26 DIAGNOSIS — F5101 Primary insomnia: Secondary | ICD-10-CM | POA: Diagnosis not present

## 2023-11-26 DIAGNOSIS — D630 Anemia in neoplastic disease: Secondary | ICD-10-CM | POA: Diagnosis not present

## 2023-11-26 DIAGNOSIS — C679 Malignant neoplasm of bladder, unspecified: Secondary | ICD-10-CM | POA: Diagnosis not present

## 2023-11-26 DIAGNOSIS — E785 Hyperlipidemia, unspecified: Secondary | ICD-10-CM | POA: Diagnosis not present

## 2023-11-26 DIAGNOSIS — D631 Anemia in chronic kidney disease: Secondary | ICD-10-CM | POA: Diagnosis not present

## 2023-11-26 DIAGNOSIS — N1832 Chronic kidney disease, stage 3b: Secondary | ICD-10-CM | POA: Diagnosis not present

## 2023-11-26 DIAGNOSIS — M4726 Other spondylosis with radiculopathy, lumbar region: Secondary | ICD-10-CM | POA: Diagnosis not present

## 2023-11-26 DIAGNOSIS — E611 Iron deficiency: Secondary | ICD-10-CM | POA: Diagnosis not present

## 2023-11-26 DIAGNOSIS — G8929 Other chronic pain: Secondary | ICD-10-CM | POA: Diagnosis not present

## 2023-11-26 DIAGNOSIS — M8008XD Age-related osteoporosis with current pathological fracture, vertebra(e), subsequent encounter for fracture with routine healing: Secondary | ICD-10-CM | POA: Diagnosis not present

## 2023-11-26 DIAGNOSIS — E440 Moderate protein-calorie malnutrition: Secondary | ICD-10-CM | POA: Diagnosis not present

## 2023-11-26 DIAGNOSIS — I5042 Chronic combined systolic (congestive) and diastolic (congestive) heart failure: Secondary | ICD-10-CM | POA: Diagnosis not present

## 2023-11-26 DIAGNOSIS — J9611 Chronic respiratory failure with hypoxia: Secondary | ICD-10-CM | POA: Diagnosis not present

## 2023-11-26 DIAGNOSIS — M069 Rheumatoid arthritis, unspecified: Secondary | ICD-10-CM | POA: Diagnosis not present

## 2023-11-26 NOTE — Transitions of Care (Post Inpatient/ED Visit) (Signed)
   11/26/2023  Name: [name] MRN: [id number] DOB: 09-02-1955  Today's TOC FU Call Status: Today's TOC FU Call Status:: Unsuccessful Call (1st Attempt) Unsuccessful Call (1st Attempt) Date: 11/26/23  Attempted to reach the patient regarding the most recent Inpatient/ED visit.  Follow Up Plan: Additional outreach attempts will be made to reach the patient to complete the Transitions of Care (Post Inpatient/ED visit) call.   Signature Karena Addison, LPN [facility] Nurse Health Advisor Direct Dial [phone]

## 2023-11-26 NOTE — Telephone Encounter (Signed)
 Received inbound call from ED nursing secretary who reports patient's CNA Haylynn [phone] is requesting wound supplies for patient.   This RNCM spoke with [place] w/[facility] who reports the SW covering yesterday requested HHPT/OT only. Per chart review there was an order for [facility], HHPT/OT however was discontinued once patient was discharged. Per Adele Dan HHPT/OT is scheduled to see the patient today and any additional HH services the HHPT will order.   This RNCM spoke with patient's daughter Earley Brooke [phone] who reports she is a CNA for [facility] and was told by [facility] that the hospital should provide medical supplies. Per chart review patient has HH services set up with [facility] and they will provide medical supplies. The hospital does not order any wound care or medical supplies. Haylynn verbalized understanding. Haylynn then requested assistance with obtaining a hospital bed. This RNCM advised due to patient being discharged on yesterday the patient's PCP will need to order the hospital bed. This RNCM encouraged Haylynn to also request [facility] for assistance with obtaining the hospital bed.     No additional TOC needs.

## 2023-11-27 ENCOUNTER — Telehealth: Payer: Self-pay | Admitting: Family Medicine

## 2023-11-27 ENCOUNTER — Telehealth: Payer: Self-pay

## 2023-11-27 DIAGNOSIS — C679 Malignant neoplasm of bladder, unspecified: Secondary | ICD-10-CM | POA: Diagnosis not present

## 2023-11-27 DIAGNOSIS — E440 Moderate protein-calorie malnutrition: Secondary | ICD-10-CM | POA: Diagnosis not present

## 2023-11-27 DIAGNOSIS — D520 Dietary folate deficiency anemia: Secondary | ICD-10-CM | POA: Diagnosis not present

## 2023-11-27 DIAGNOSIS — I420 Dilated cardiomyopathy: Secondary | ICD-10-CM | POA: Diagnosis not present

## 2023-11-27 DIAGNOSIS — M069 Rheumatoid arthritis, unspecified: Secondary | ICD-10-CM | POA: Diagnosis not present

## 2023-11-27 DIAGNOSIS — N1330 Unspecified hydronephrosis: Secondary | ICD-10-CM | POA: Diagnosis not present

## 2023-11-27 DIAGNOSIS — J449 Chronic obstructive pulmonary disease, unspecified: Secondary | ICD-10-CM | POA: Diagnosis not present

## 2023-11-27 DIAGNOSIS — N179 Acute kidney failure, unspecified: Secondary | ICD-10-CM | POA: Diagnosis not present

## 2023-11-27 DIAGNOSIS — G8929 Other chronic pain: Secondary | ICD-10-CM | POA: Diagnosis not present

## 2023-11-27 DIAGNOSIS — J9611 Chronic respiratory failure with hypoxia: Secondary | ICD-10-CM | POA: Diagnosis not present

## 2023-11-27 DIAGNOSIS — M899 Disorder of bone, unspecified: Secondary | ICD-10-CM | POA: Diagnosis not present

## 2023-11-27 DIAGNOSIS — D631 Anemia in chronic kidney disease: Secondary | ICD-10-CM | POA: Diagnosis not present

## 2023-11-27 DIAGNOSIS — M8008XD Age-related osteoporosis with current pathological fracture, vertebra(e), subsequent encounter for fracture with routine healing: Secondary | ICD-10-CM | POA: Diagnosis not present

## 2023-11-27 DIAGNOSIS — M47814 Spondylosis without myelopathy or radiculopathy, thoracic region: Secondary | ICD-10-CM | POA: Diagnosis not present

## 2023-11-27 DIAGNOSIS — D72829 Elevated white blood cell count, unspecified: Secondary | ICD-10-CM | POA: Diagnosis not present

## 2023-11-27 DIAGNOSIS — D630 Anemia in neoplastic disease: Secondary | ICD-10-CM | POA: Diagnosis not present

## 2023-11-27 DIAGNOSIS — G8220 Paraplegia, unspecified: Secondary | ICD-10-CM | POA: Diagnosis not present

## 2023-11-27 DIAGNOSIS — F5101 Primary insomnia: Secondary | ICD-10-CM | POA: Diagnosis not present

## 2023-11-27 DIAGNOSIS — M4726 Other spondylosis with radiculopathy, lumbar region: Secondary | ICD-10-CM | POA: Diagnosis not present

## 2023-11-27 DIAGNOSIS — I5042 Chronic combined systolic (congestive) and diastolic (congestive) heart failure: Secondary | ICD-10-CM | POA: Diagnosis not present

## 2023-11-27 DIAGNOSIS — E785 Hyperlipidemia, unspecified: Secondary | ICD-10-CM | POA: Diagnosis not present

## 2023-11-27 DIAGNOSIS — E611 Iron deficiency: Secondary | ICD-10-CM | POA: Diagnosis not present

## 2023-11-27 DIAGNOSIS — N1832 Chronic kidney disease, stage 3b: Secondary | ICD-10-CM | POA: Diagnosis not present

## 2023-11-27 NOTE — Transitions of Care (Post Inpatient/ED Visit) (Signed)
   11/27/2023  Name: [name] MRN: [id number] DOB: 1956/08/09  Today's TOC FU Call Status: Today's TOC FU Call Status:: Unsuccessful Call (2nd Attempt) Unsuccessful Call (1st Attempt) Date: 11/26/23 Unsuccessful Call (2nd Attempt) Date: 11/27/23  Attempted to reach the patient regarding the most recent Inpatient/ED visit.  Follow Up Plan: Additional outreach attempts will be made to reach the patient to complete the Transitions of Care (Post Inpatient/ED visit) call.   Signature Karena Addison, LPN [facility] Nurse Health Advisor Direct Dial [phone]

## 2023-11-27 NOTE — Telephone Encounter (Signed)
 [facility] Note Date 11/26/23 Client Coordination Note Report

## 2023-11-27 NOTE — Telephone Encounter (Signed)
 Verbal ok given to [place] for orders below.  Copied from CRM [id number]. Topic: Clinical - Home Health Verbal Orders >> Nov 26, 2023  3:25 PM Carlatta H wrote: Caller/Agency: [facility] Number: [phone] Service Requested: Physical Therapy Frequency: 2 time a week for 3 week/1 time a week for 1 week//Skilled nursing evaluation// Any new concerns about the patient? No

## 2023-11-28 ENCOUNTER — Telehealth: Payer: Self-pay

## 2023-11-28 ENCOUNTER — Ambulatory Visit

## 2023-11-28 ENCOUNTER — Telehealth: Payer: Self-pay | Admitting: *Deleted

## 2023-11-28 DIAGNOSIS — R531 Weakness: Secondary | ICD-10-CM | POA: Diagnosis not present

## 2023-11-28 DIAGNOSIS — M549 Dorsalgia, unspecified: Secondary | ICD-10-CM | POA: Diagnosis not present

## 2023-11-28 DIAGNOSIS — R279 Unspecified lack of coordination: Secondary | ICD-10-CM | POA: Diagnosis not present

## 2023-11-28 DIAGNOSIS — M51369 Other intervertebral disc degeneration, lumbar region without mention of lumbar back pain or lower extremity pain: Secondary | ICD-10-CM | POA: Diagnosis not present

## 2023-11-28 DIAGNOSIS — Z743 Need for continuous supervision: Secondary | ICD-10-CM | POA: Diagnosis not present

## 2023-11-28 DIAGNOSIS — M5134 Other intervertebral disc degeneration, thoracic region: Secondary | ICD-10-CM | POA: Diagnosis not present

## 2023-11-28 DIAGNOSIS — M5030 Other cervical disc degeneration, unspecified cervical region: Secondary | ICD-10-CM | POA: Diagnosis not present

## 2023-11-28 DIAGNOSIS — N184 Chronic kidney disease, stage 4 (severe): Secondary | ICD-10-CM | POA: Diagnosis not present

## 2023-11-28 DIAGNOSIS — M542 Cervicalgia: Secondary | ICD-10-CM | POA: Diagnosis not present

## 2023-11-28 DIAGNOSIS — G8929 Other chronic pain: Secondary | ICD-10-CM | POA: Diagnosis not present

## 2023-11-28 NOTE — Telephone Encounter (Signed)
 Patient's daughter mentioned that Gu[facility] needs an ambulance to be transported into the office. I transferred the call to [facility].

## 2023-11-28 NOTE — Telephone Encounter (Signed)
 Spoke with pt's daughter in response to a MyChart message wanting to know what the next step is her Pt's care. Looks like the [employer] has reached out to pt and they would have information on what to do next. Haylynn stated she would try calling them back to see what the next step is. She verbalized understanding and had no further questions.

## 2023-12-01 ENCOUNTER — Telehealth: Payer: Self-pay | Admitting: Thoracic Surgery

## 2023-12-01 ENCOUNTER — Inpatient Hospital Stay

## 2023-12-01 DIAGNOSIS — Z936 Other artificial openings of urinary tract status: Secondary | ICD-10-CM | POA: Diagnosis not present

## 2023-12-01 DIAGNOSIS — N1330 Unspecified hydronephrosis: Secondary | ICD-10-CM | POA: Diagnosis not present

## 2023-12-01 DIAGNOSIS — N183 Chronic kidney disease, stage 3 unspecified: Secondary | ICD-10-CM

## 2023-12-01 DIAGNOSIS — I5042 Chronic combined systolic (congestive) and diastolic (congestive) heart failure: Secondary | ICD-10-CM | POA: Diagnosis not present

## 2023-12-01 DIAGNOSIS — M8458XS Pathological fracture in neoplastic disease, other specified site, sequela: Secondary | ICD-10-CM

## 2023-12-01 DIAGNOSIS — M899 Disorder of bone, unspecified: Secondary | ICD-10-CM | POA: Diagnosis not present

## 2023-12-01 DIAGNOSIS — N179 Acute kidney failure, unspecified: Secondary | ICD-10-CM | POA: Diagnosis not present

## 2023-12-01 DIAGNOSIS — E785 Hyperlipidemia, unspecified: Secondary | ICD-10-CM | POA: Diagnosis not present

## 2023-12-01 DIAGNOSIS — Z5112 Encounter for antineoplastic immunotherapy: Secondary | ICD-10-CM | POA: Insufficient documentation

## 2023-12-01 DIAGNOSIS — M47814 Spondylosis without myelopathy or radiculopathy, thoracic region: Secondary | ICD-10-CM | POA: Diagnosis not present

## 2023-12-01 DIAGNOSIS — M8448XA Pathological fracture, other site, initial encounter for fracture: Secondary | ICD-10-CM

## 2023-12-01 DIAGNOSIS — D630 Anemia in neoplastic disease: Secondary | ICD-10-CM | POA: Diagnosis not present

## 2023-12-01 DIAGNOSIS — M4726 Other spondylosis with radiculopathy, lumbar region: Secondary | ICD-10-CM | POA: Diagnosis not present

## 2023-12-01 DIAGNOSIS — E611 Iron deficiency: Secondary | ICD-10-CM | POA: Diagnosis not present

## 2023-12-01 DIAGNOSIS — M8008XD Age-related osteoporosis with current pathological fracture, vertebra(e), subsequent encounter for fracture with routine healing: Secondary | ICD-10-CM | POA: Diagnosis not present

## 2023-12-01 DIAGNOSIS — F5101 Primary insomnia: Secondary | ICD-10-CM | POA: Diagnosis not present

## 2023-12-01 DIAGNOSIS — M8008XS Age-related osteoporosis with current pathological fracture, vertebra(e), sequela: Secondary | ICD-10-CM | POA: Insufficient documentation

## 2023-12-01 DIAGNOSIS — C679 Malignant neoplasm of bladder, unspecified: Secondary | ICD-10-CM | POA: Insufficient documentation

## 2023-12-01 DIAGNOSIS — G893 Neoplasm related pain (acute) (chronic): Secondary | ICD-10-CM | POA: Insufficient documentation

## 2023-12-01 DIAGNOSIS — D72829 Elevated white blood cell count, unspecified: Secondary | ICD-10-CM | POA: Diagnosis not present

## 2023-12-01 DIAGNOSIS — N1832 Chronic kidney disease, stage 3b: Secondary | ICD-10-CM | POA: Insufficient documentation

## 2023-12-01 DIAGNOSIS — G8220 Paraplegia, unspecified: Secondary | ICD-10-CM | POA: Diagnosis not present

## 2023-12-01 DIAGNOSIS — D520 Dietary folate deficiency anemia: Secondary | ICD-10-CM | POA: Diagnosis not present

## 2023-12-01 DIAGNOSIS — Z79891 Long term (current) use of opiate analgesic: Secondary | ICD-10-CM | POA: Insufficient documentation

## 2023-12-01 DIAGNOSIS — I420 Dilated cardiomyopathy: Secondary | ICD-10-CM | POA: Diagnosis not present

## 2023-12-01 DIAGNOSIS — J9611 Chronic respiratory failure with hypoxia: Secondary | ICD-10-CM | POA: Diagnosis not present

## 2023-12-01 DIAGNOSIS — G8929 Other chronic pain: Secondary | ICD-10-CM | POA: Diagnosis not present

## 2023-12-01 DIAGNOSIS — E440 Moderate protein-calorie malnutrition: Secondary | ICD-10-CM | POA: Diagnosis not present

## 2023-12-01 DIAGNOSIS — M069 Rheumatoid arthritis, unspecified: Secondary | ICD-10-CM | POA: Diagnosis not present

## 2023-12-01 DIAGNOSIS — D509 Iron deficiency anemia, unspecified: Secondary | ICD-10-CM | POA: Insufficient documentation

## 2023-12-01 DIAGNOSIS — J449 Chronic obstructive pulmonary disease, unspecified: Secondary | ICD-10-CM | POA: Diagnosis not present

## 2023-12-01 DIAGNOSIS — D631 Anemia in chronic kidney disease: Secondary | ICD-10-CM | POA: Diagnosis not present

## 2023-12-01 HISTORY — DX: Other artificial openings of urinary tract status: Z93.6

## 2023-12-01 HISTORY — DX: Chronic kidney disease, stage 3 unspecified: N18.30

## 2023-12-01 HISTORY — DX: Pathological fracture, other site, initial encounter for fracture: M84.48XA

## 2023-12-01 MED ORDER — CARBOXYMETHYLCELL-GLYCERIN PF 0.5-0.9 % OP SOLN
2.0000 [drp] | Freq: Four times a day (QID) | OPHTHALMIC | 11 refills | Status: DC
Start: 2023-12-01 — End: 2024-07-23

## 2023-12-01 MED ORDER — PROCHLORPERAZINE MALEATE 10 MG PO TABS
10.0000 mg | ORAL_TABLET | Freq: Four times a day (QID) | ORAL | 1 refills | Status: DC | PRN
Start: 2023-12-01 — End: 2023-12-15

## 2023-12-01 MED ORDER — ONDANSETRON HCL 8 MG PO TABS: 8.0000 mg | ORAL_TABLET | Freq: Three times a day (TID) | ORAL | 1 refills | Status: DC | PRN

## 2023-12-01 MED ORDER — LIDOCAINE-PRILOCAINE 2.5-2.5 % EX CREA: TOPICAL_CREAM | CUTANEOUS | 3 refills | Status: DC

## 2023-12-01 NOTE — Telephone Encounter (Signed)
 Returned Haylin's call. I was unable to leave a VM as her mailbox was full.

## 2023-12-01 NOTE — Assessment & Plan Note (Addendum)
 Repeat lab when reture

## 2023-12-01 NOTE — Progress Notes (Signed)
 [facility] OFFICE PROGRESS NOTE  Patient Care Team: [name], FNP as PCP - General (Family Medicine) Regan Lemming, MD as PCP - Electrophysiology (Cardiology) Georgeanna Lea, MD as PCP - Cardiology (Cardiology) Tressie Stalker, MD as Consulting Physician (Neurosurgery) Georgeanna Lea, MD as Consulting Physician (Cardiology) Karie Soda, MD as Consulting Physician (General Surgery) Drema Dallas, DO as Consulting Physician (Neurology) Earvin Hansen, [facility] (Inactive) as Pharmacist (Pharmacist) Kerin Salen, MD as Consulting Physician (Gastroenterology) Zehr, Princella Pellegrini, PA-C as Physician Assistant (Gastroenterology) Anthony Sar, MD as Consulting Physician (Nephrology)  68 y.o.female with past medical history of COPD presenting with back pain radiating down to the hip in March of 2025 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  Cystoscopy with TURBT show right sided bladder tumor with palpable anterior vaginal mass.  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  Patient location: home with daughter Physician location: [facility] Total time 55 minutes.  Discussed first line systemic therapy per NCCN guideline as of today. From phase 3 EV-302 trial that compare the efficacy and safety of enfortumab vedotin and pembrolizumab with the efficacy and safety of platinum-based chemotherapy in patients with previously untreated locally advanced or metastatic urothelial carcinoma. Patients alive without cancer progression was longer in the enfortumab vedotin-pembrolizumab group than in the chemotherapy group (median, 12.5 months vs. 6.3 months). Treatment with enfortumab vedotin and pembrolizumab resulted in significantly better outcomes than chemotherapy.  Treatment-related adverse events can occur. The most common treatment-related adverse events of grade 3 or higher were maculopapular  rash, hyperglycemia, and neutropenia in the enfortumab vedotin-pembrolizumab group. Other potential side effects can occur. Severe side effects can result in death and patient needs to be seen and evaluated before each treatment.  Discussed the treatment schedule.  Discussed ongoing reassessment, imaging scan is needed.  She does not have good venous access, and therefore Mediport will be requested.  After discussion, she would like to proceed.  Assessment & Plan Urothelial carcinoma of bladder (HCC) PET scan and Mediport Teaching appointment for EV/P and start in about 2 weeks Follow up before cycle 1 day 1 with lab Social worker referral  Pathological fracture of vertebra due to neoplastic disease, sequela Recommend dental evaluation Zometa can be start after dental evaluation Continue vitamin D Continue PT OT Nephrostomy present (HCC) Right nephrostomy tube Follow-up with IR as scheduled Ongoing assessment of urine output Stage 3b chronic kidney disease (HCC) Repeat lab when reture  Orders Placed This Encounter  Procedures   Consent Attestation for Oncology Treatment    The patient is informed of risks, benefits, side-effects of the prescribed oncology treatment. Potential short term and long term side effects and response rates discussed. After a long discussion, the patient made informed decision to proceed.:   Yes   NM PET Image Restag (PS) Skull Base To Thigh    Standing Status:   Future    Expected Date:   12/08/2023    Expiration Date:   11/30/2024    If indicated for the ordered procedure, I authorize the administration of a radiopharmaceutical per Radiology protocol:   Yes    Preferred imaging location?:   [facility]   IR IMAGING GUIDED PORT INSERTION    Standing Status:   Future    Expected Date:   12/08/2023    Expiration Date:   11/30/2024    Reason for Exam (SYMPTOM  OR DIAGNOSIS REQUIRED):   port  for chemo    Preferred Imaging Location?:   [facility]   CBC  with Differential (Cancer Center Only)    Standing Status:   Future    Expected Date:   12/18/2023    Expiration Date:   12/17/2024   CMP (Cancer Center only)    Standing Status:   Future    Expected Date:   12/18/2023    Expiration Date:   12/17/2024   TSH    Standing Status:   Future    Expected Date:   12/18/2023    Expiration Date:   12/17/2024   CBC with Differential (Cancer Center Only)    Standing Status:   Future    Expected Date:   12/25/2023    Expiration Date:   12/24/2024   CMP (Cancer Center only)    Standing Status:   Future    Expected Date:   12/25/2023    Expiration Date:   12/24/2024   T4, free    Standing Status:   Future    Expected Date:   12/18/2023    Expiration Date:   12/17/2024   CBC with Differential (Cancer Center Only)    Standing Status:   Future    Expected Date:   01/08/2024    Expiration Date:   01/07/2025   CMP (Cancer Center only)    Standing Status:   Future    Expected Date:   01/08/2024    Expiration Date:   01/07/2025   CBC with Differential (Cancer Center Only)    Standing Status:   Future    Expected Date:   01/15/2024    Expiration Date:   01/14/2025   CMP (Cancer Center only)    Standing Status:   Future    Expected Date:   01/15/2024    Expiration Date:   01/14/2025   CBC with Differential (Cancer Center Only)    Standing Status:   Future    Expected Date:   01/29/2024    Expiration Date:   01/28/2025   CMP (Cancer Center only)    Standing Status:   Future    Expected Date:   01/29/2024    Expiration Date:   01/28/2025   TSH    Standing Status:   Future    Expected Date:   01/29/2024    Expiration Date:   01/28/2025   T4, free    Standing Status:   Future    Expected Date:   01/29/2024    Expiration Date:   01/28/2025   CBC with Differential (Cancer Center Only)    Standing Status:   Future    Expected Date:   02/05/2024    Expiration Date:   02/04/2025   CMP (Cancer Center only)    Standing Status:   Future    Expected Date:   02/05/2024    Expiration  Date:   02/04/2025   CBC with Differential (Cancer Center Only)    Standing Status:   Future    Expected Date:   02/19/2024    Expiration Date:   02/18/2025   CMP (Cancer Center only)    Standing Status:   Future    Expected Date:   02/19/2024    Expiration Date:   02/18/2025   CBC with Differential (Cancer Center Only)    Standing Status:   Future    Expected Date:   02/26/2024    Expiration Date:   02/25/2025   CMP (Cancer Center only)  Standing Status:   Future    Expected Date:   02/26/2024    Expiration Date:   02/25/2025   Ambulatory referral to Social Work    Referral Priority:   Routine    Referral Type:   Consultation    Referral Reason:   Specialty Services Required    Number of Visits Requested:   1   PHYSICIAN COMMUNICATION ORDER    Enfortumab: Diabetic ketoacidosis may occur in patients with and without preexisting diabetes mellitus, which may be fatal. Closely monitor blood glucose levels in patients with, or at risk for, diabetes mellitus or hyperglycemia. Withhold if blood glucose >250 mg/dL.   ONCBCN PHYSICIAN COMMUNICATION 1    In Hoimes CJ, et al., patients were permitted to continue study treatment until radiographically confirmed disease progression, unacceptable toxicity, investigator decision, consent withdrawal, the start of subsequent anticancer therapy, or pregnancy. Please use physician discretion when adding additional cycles after cycle 4   ONCBCN PHYSICIAN COMMUNICATION 2    Enfortumab: Monitor patients for signs or symptoms of ocular disorders. Consider prophylactic artificial tears for dry eyes and treatment with ophthalmic topical steroids after an ophthalmic exam. Consider dose interruption or dose reduction when symptomatic ocular disorders occur.   ONCBCN PHYSICIAN COMMUNICATION 5    Thyroid function tests at baseline and every 3rd cycle     Melven Sartorius, MD  INTERVAL HISTORY: Patient being follow-up through telephone visit.  She cannot get a last-minute  ambulance.  Reports she required ambulance transport due to back pain.  Overall she is feeling better working with physical therapy and Occupational Therapy at home.  Reports eating and drinking fine.  Report urine output is fine from nephrostomy tube and urination.  Oncology History  Urothelial carcinoma of bladder (HCC)  11/18/2023 Initial Diagnosis   Urothelial carcinoma of bladder (HCC)   12/01/2023 Cancer Staging   Staging form: Urinary Bladder, AJCC 8th Edition - Clinical: Stage IVB (cTX, cNX, pM1b) - Signed by Melven Sartorius, MD on 12/01/2023 WHO/ISUP grade (low/high): High Grade Histologic grading system: 2 grade system   12/18/2023 -  Chemotherapy   Patient is on Treatment Plan : UROTHELIAL ADVANCED, METASTATIC ENFORTUMAB D1, D8 + PEMBROLIZUMAB (200) D1 Q21D       PHYSICAL EXAMINATION: NA   Relevant data reviewed during this visit included pathology, imaging and labs.

## 2023-12-01 NOTE — Progress Notes (Signed)
 START ON PATHWAY REGIMEN - Bladder     A cycle is every 21 days:     Enfortumab vedotin-ejfv      Pembrolizumab   **Always confirm dose/schedule in your pharmacy ordering system**  Patient Characteristics: Advanced/Metastatic Disease, First Line, No Prior Platinum-Based Therapy OR Prior Platinum-Based Therapy and Relapse > 12 Months, Immunotherapy + ADC Candidate Therapeutic Status: Advanced/Metastatic Disease Line of Therapy: First Line Intent of Therapy: Non-Curative / Palliative Intent, Discussed with Patient

## 2023-12-01 NOTE — Assessment & Plan Note (Addendum)
 PET scan and Mediport Teaching appointment for EV/P and start in about 2 weeks Follow up before cycle 1 day 1 with lab Social worker referral

## 2023-12-01 NOTE — Assessment & Plan Note (Addendum)
 Recommend dental evaluation Zometa can be start after dental evaluation Continue vitamin D Continue PT OT

## 2023-12-01 NOTE — Assessment & Plan Note (Addendum)
 Right nephrostomy tube Follow-up with IR as scheduled Ongoing assessment of urine output

## 2023-12-02 ENCOUNTER — Inpatient Hospital Stay

## 2023-12-02 ENCOUNTER — Other Ambulatory Visit: Payer: Self-pay

## 2023-12-02 ENCOUNTER — Telehealth: Payer: Self-pay

## 2023-12-02 NOTE — Progress Notes (Signed)
 [facility] Clinical Social Work  Initial Assessment   [name] is a 68 y.o. year old female contacted by phone. Clinical Social Work was referred by new patient protocol for assessment of psychosocial needs.   SDOH (Social Determinants of Health) assessments performed: No SDOH Interventions    Flowsheet Row ED to Hosp-Admission (Discharged) from 11/14/2023 in [facility]LOOR [facility] UROLOGY Office Visit from 09/25/2023 in [facility] Office Visit from 04/07/2023 in [facility] Office Visit from 12/31/2022 in [facility] Office Visit from 03/20/2022 in [facility] Office Visit from 06/26/2021 in [facility]  SDOH Interventions        Food Insecurity Interventions Patient Declined -- -- Intervention Not Indicated -- --  Housing Interventions -- -- -- Intervention Not Indicated -- --  Transportation Interventions Inpatient TOC, Intervention Not Indicated  [Patient has Medicaid and is eligible for Medicaid transport.] -- -- Intervention Not Indicated -- --  Utilities Interventions -- -- -- Intervention Not Indicated -- --  Alcohol Usage Interventions -- -- -- Intervention Not Indicated (Score <7) -- --  Depression Interventions/Treatment  -- Medication Currently on Treatment Currently on Treatment Medication Medication  Financial Strain Interventions -- -- -- Intervention Not Indicated -- --  Physical Activity Interventions -- -- -- Intervention Not Indicated -- --  Stress Interventions -- -- -- Intervention Not Indicated -- --  Social Connections Interventions -- -- -- Intervention Not Indicated -- --  Health Literacy Interventions -- -- Intervention Not Indicated -- -- --       SDOH Screenings   Food Insecurity: No Food Insecurity (11/15/2023)  Housing: Low Risk  (11/15/2023)  Transportation Needs: Unmet Transportation Needs (11/15/2023)  Utilities: Not At Risk (11/15/2023)   Alcohol Screen: Low Risk  (12/31/2022)  Depression (PHQ2-9): High Risk (09/25/2023)  Financial Resource Strain: Low Risk  (04/05/2023)  Physical Activity: Insufficiently Active (04/05/2023)  Social Connections: Moderately Isolated (11/15/2023)  Stress: Stress Concern Present (04/05/2023)  Tobacco Use: Medium Risk (11/25/2023)  Health Literacy: Adequate Health Literacy (04/07/2023)     Distress Screen completed: No     No data to display            Family/Social Information:  Housing Arrangement: patient lives with her boyfriend. Patient's daughter is close by in [place]. Family members/support persons in your life? Family Transportation concerns: Patient utilizes Building [employer] due to inability to walk.   Employment: Disabled for several years.  Income source: [employer] Income Financial concerns:  Patient denied any concerns at this time. Reported that she able to pay for basic needs. Type of concern: None Food access concerns: no Religious or spiritual practice: Not known Advanced directives: No Services Currently in place:  Family Members, Insurance, Income  Coping/ Adjustment to diagnosis: Patient understands treatment plan and what happens next? yes Concerns about diagnosis and/or treatment: I'm not especially worried about anything Current coping skills/ strengths: Ability for insight , Average or above average intelligence , Communication skills , General fund of knowledge , Motivation for treatment/growth , and Supportive family/friends     SUMMARY: Current SDOH Barriers:  No SDOH Barriers identified at time of call.  Clinical Social Work Clinical Goal(s):  No clinical social work goals at this time  Interventions: Discussed common feeling and emotions when being diagnosed with cancer, and the importance of support during treatment Informed patient of the support team roles and support services at [facility] Provided  CSW contact information and encouraged  patient to call with any questions or concerns Provided education and assistance to patient regarding Advance Directives   Follow Up Plan: Patient will contact CSW with any support or resource needs Patient verbalizes understanding of plan: Yes   Marguerita Merles, LCSW Clinical Social Worker [facility] [phone]

## 2023-12-02 NOTE — Transitions of Care (Post Inpatient/ED Visit) (Signed)
   12/02/2023  Name: [name] MRN: [id number] DOB: May 13, 1956  Today's TOC FU Call Status: Today's TOC FU Call Status:: Unsuccessful Call (3rd Attempt) Unsuccessful Call (1st Attempt) Date: 11/26/23 Unsuccessful Call (2nd Attempt) Date: 11/27/23 Unsuccessful Call (3rd Attempt) Date: 12/02/23  Attempted to reach the patient regarding the most recent Inpatient/ED visit.  Follow Up Plan: No further outreach attempts will be made at this time. We have been unable to contact the patient.  Signature Karena Addison, LPN [facility] Nurse Health Advisor Direct Dial [phone]

## 2023-12-02 NOTE — Telephone Encounter (Signed)
 Patient scheduled appointments. Patient is aware of all appointment details.

## 2023-12-03 ENCOUNTER — Ambulatory Visit (INDEPENDENT_AMBULATORY_CARE_PROVIDER_SITE_OTHER): Payer: 59

## 2023-12-03 DIAGNOSIS — I420 Dilated cardiomyopathy: Secondary | ICD-10-CM | POA: Diagnosis not present

## 2023-12-05 DIAGNOSIS — D520 Dietary folate deficiency anemia: Secondary | ICD-10-CM | POA: Diagnosis not present

## 2023-12-05 DIAGNOSIS — M069 Rheumatoid arthritis, unspecified: Secondary | ICD-10-CM | POA: Diagnosis not present

## 2023-12-05 DIAGNOSIS — M47814 Spondylosis without myelopathy or radiculopathy, thoracic region: Secondary | ICD-10-CM | POA: Diagnosis not present

## 2023-12-05 DIAGNOSIS — G8929 Other chronic pain: Secondary | ICD-10-CM | POA: Diagnosis not present

## 2023-12-05 DIAGNOSIS — M8008XD Age-related osteoporosis with current pathological fracture, vertebra(e), subsequent encounter for fracture with routine healing: Secondary | ICD-10-CM | POA: Diagnosis not present

## 2023-12-05 DIAGNOSIS — I5042 Chronic combined systolic (congestive) and diastolic (congestive) heart failure: Secondary | ICD-10-CM | POA: Diagnosis not present

## 2023-12-05 DIAGNOSIS — D72829 Elevated white blood cell count, unspecified: Secondary | ICD-10-CM | POA: Diagnosis not present

## 2023-12-05 DIAGNOSIS — I420 Dilated cardiomyopathy: Secondary | ICD-10-CM | POA: Diagnosis not present

## 2023-12-05 DIAGNOSIS — M899 Disorder of bone, unspecified: Secondary | ICD-10-CM | POA: Diagnosis not present

## 2023-12-05 DIAGNOSIS — E611 Iron deficiency: Secondary | ICD-10-CM | POA: Diagnosis not present

## 2023-12-05 DIAGNOSIS — F5101 Primary insomnia: Secondary | ICD-10-CM | POA: Diagnosis not present

## 2023-12-05 DIAGNOSIS — M4726 Other spondylosis with radiculopathy, lumbar region: Secondary | ICD-10-CM | POA: Diagnosis not present

## 2023-12-05 DIAGNOSIS — E440 Moderate protein-calorie malnutrition: Secondary | ICD-10-CM | POA: Diagnosis not present

## 2023-12-05 DIAGNOSIS — J449 Chronic obstructive pulmonary disease, unspecified: Secondary | ICD-10-CM | POA: Diagnosis not present

## 2023-12-05 DIAGNOSIS — N1832 Chronic kidney disease, stage 3b: Secondary | ICD-10-CM | POA: Diagnosis not present

## 2023-12-05 DIAGNOSIS — D631 Anemia in chronic kidney disease: Secondary | ICD-10-CM | POA: Diagnosis not present

## 2023-12-05 DIAGNOSIS — N179 Acute kidney failure, unspecified: Secondary | ICD-10-CM | POA: Diagnosis not present

## 2023-12-05 DIAGNOSIS — E785 Hyperlipidemia, unspecified: Secondary | ICD-10-CM | POA: Diagnosis not present

## 2023-12-05 DIAGNOSIS — J9611 Chronic respiratory failure with hypoxia: Secondary | ICD-10-CM | POA: Diagnosis not present

## 2023-12-05 DIAGNOSIS — C679 Malignant neoplasm of bladder, unspecified: Secondary | ICD-10-CM | POA: Diagnosis not present

## 2023-12-05 DIAGNOSIS — G8220 Paraplegia, unspecified: Secondary | ICD-10-CM | POA: Diagnosis not present

## 2023-12-05 DIAGNOSIS — N1330 Unspecified hydronephrosis: Secondary | ICD-10-CM | POA: Diagnosis not present

## 2023-12-05 DIAGNOSIS — D630 Anemia in neoplastic disease: Secondary | ICD-10-CM | POA: Diagnosis not present

## 2023-12-06 DIAGNOSIS — M47814 Spondylosis without myelopathy or radiculopathy, thoracic region: Secondary | ICD-10-CM | POA: Diagnosis not present

## 2023-12-06 DIAGNOSIS — D631 Anemia in chronic kidney disease: Secondary | ICD-10-CM | POA: Diagnosis not present

## 2023-12-06 DIAGNOSIS — E611 Iron deficiency: Secondary | ICD-10-CM | POA: Diagnosis not present

## 2023-12-06 DIAGNOSIS — N179 Acute kidney failure, unspecified: Secondary | ICD-10-CM | POA: Diagnosis not present

## 2023-12-06 DIAGNOSIS — D72829 Elevated white blood cell count, unspecified: Secondary | ICD-10-CM | POA: Diagnosis not present

## 2023-12-06 DIAGNOSIS — C679 Malignant neoplasm of bladder, unspecified: Secondary | ICD-10-CM | POA: Diagnosis not present

## 2023-12-06 DIAGNOSIS — N1330 Unspecified hydronephrosis: Secondary | ICD-10-CM | POA: Diagnosis not present

## 2023-12-06 DIAGNOSIS — D520 Dietary folate deficiency anemia: Secondary | ICD-10-CM | POA: Diagnosis not present

## 2023-12-06 DIAGNOSIS — E785 Hyperlipidemia, unspecified: Secondary | ICD-10-CM | POA: Diagnosis not present

## 2023-12-06 DIAGNOSIS — J9611 Chronic respiratory failure with hypoxia: Secondary | ICD-10-CM | POA: Diagnosis not present

## 2023-12-06 DIAGNOSIS — J449 Chronic obstructive pulmonary disease, unspecified: Secondary | ICD-10-CM | POA: Diagnosis not present

## 2023-12-06 DIAGNOSIS — D630 Anemia in neoplastic disease: Secondary | ICD-10-CM | POA: Diagnosis not present

## 2023-12-06 DIAGNOSIS — M4726 Other spondylosis with radiculopathy, lumbar region: Secondary | ICD-10-CM | POA: Diagnosis not present

## 2023-12-06 DIAGNOSIS — F5101 Primary insomnia: Secondary | ICD-10-CM | POA: Diagnosis not present

## 2023-12-06 DIAGNOSIS — M069 Rheumatoid arthritis, unspecified: Secondary | ICD-10-CM | POA: Diagnosis not present

## 2023-12-06 DIAGNOSIS — M899 Disorder of bone, unspecified: Secondary | ICD-10-CM | POA: Diagnosis not present

## 2023-12-06 DIAGNOSIS — N1832 Chronic kidney disease, stage 3b: Secondary | ICD-10-CM | POA: Diagnosis not present

## 2023-12-06 DIAGNOSIS — G8220 Paraplegia, unspecified: Secondary | ICD-10-CM | POA: Diagnosis not present

## 2023-12-06 DIAGNOSIS — M8008XD Age-related osteoporosis with current pathological fracture, vertebra(e), subsequent encounter for fracture with routine healing: Secondary | ICD-10-CM | POA: Diagnosis not present

## 2023-12-06 DIAGNOSIS — I420 Dilated cardiomyopathy: Secondary | ICD-10-CM | POA: Diagnosis not present

## 2023-12-06 DIAGNOSIS — E440 Moderate protein-calorie malnutrition: Secondary | ICD-10-CM | POA: Diagnosis not present

## 2023-12-06 DIAGNOSIS — I5042 Chronic combined systolic (congestive) and diastolic (congestive) heart failure: Secondary | ICD-10-CM | POA: Diagnosis not present

## 2023-12-06 DIAGNOSIS — G8929 Other chronic pain: Secondary | ICD-10-CM | POA: Diagnosis not present

## 2023-12-08 ENCOUNTER — Ambulatory Visit: Attending: Cardiology

## 2023-12-08 DIAGNOSIS — C679 Malignant neoplasm of bladder, unspecified: Secondary | ICD-10-CM | POA: Diagnosis not present

## 2023-12-08 DIAGNOSIS — N179 Acute kidney failure, unspecified: Secondary | ICD-10-CM | POA: Diagnosis not present

## 2023-12-08 DIAGNOSIS — N1832 Chronic kidney disease, stage 3b: Secondary | ICD-10-CM | POA: Diagnosis not present

## 2023-12-08 DIAGNOSIS — J449 Chronic obstructive pulmonary disease, unspecified: Secondary | ICD-10-CM | POA: Diagnosis not present

## 2023-12-08 DIAGNOSIS — D631 Anemia in chronic kidney disease: Secondary | ICD-10-CM | POA: Diagnosis not present

## 2023-12-08 DIAGNOSIS — E785 Hyperlipidemia, unspecified: Secondary | ICD-10-CM | POA: Diagnosis not present

## 2023-12-08 DIAGNOSIS — G8929 Other chronic pain: Secondary | ICD-10-CM | POA: Diagnosis not present

## 2023-12-08 DIAGNOSIS — M899 Disorder of bone, unspecified: Secondary | ICD-10-CM | POA: Diagnosis not present

## 2023-12-08 DIAGNOSIS — M47814 Spondylosis without myelopathy or radiculopathy, thoracic region: Secondary | ICD-10-CM | POA: Diagnosis not present

## 2023-12-08 DIAGNOSIS — F5101 Primary insomnia: Secondary | ICD-10-CM | POA: Diagnosis not present

## 2023-12-08 DIAGNOSIS — D72829 Elevated white blood cell count, unspecified: Secondary | ICD-10-CM | POA: Diagnosis not present

## 2023-12-08 DIAGNOSIS — M069 Rheumatoid arthritis, unspecified: Secondary | ICD-10-CM | POA: Diagnosis not present

## 2023-12-08 DIAGNOSIS — D630 Anemia in neoplastic disease: Secondary | ICD-10-CM | POA: Diagnosis not present

## 2023-12-08 DIAGNOSIS — J9611 Chronic respiratory failure with hypoxia: Secondary | ICD-10-CM | POA: Diagnosis not present

## 2023-12-08 DIAGNOSIS — N1330 Unspecified hydronephrosis: Secondary | ICD-10-CM | POA: Diagnosis not present

## 2023-12-08 DIAGNOSIS — I420 Dilated cardiomyopathy: Secondary | ICD-10-CM | POA: Diagnosis not present

## 2023-12-08 DIAGNOSIS — M4726 Other spondylosis with radiculopathy, lumbar region: Secondary | ICD-10-CM | POA: Diagnosis not present

## 2023-12-08 DIAGNOSIS — I5022 Chronic systolic (congestive) heart failure: Secondary | ICD-10-CM | POA: Diagnosis not present

## 2023-12-08 DIAGNOSIS — G8220 Paraplegia, unspecified: Secondary | ICD-10-CM | POA: Diagnosis not present

## 2023-12-08 DIAGNOSIS — E611 Iron deficiency: Secondary | ICD-10-CM | POA: Diagnosis not present

## 2023-12-08 DIAGNOSIS — Z95810 Presence of automatic (implantable) cardiac defibrillator: Secondary | ICD-10-CM | POA: Diagnosis not present

## 2023-12-08 DIAGNOSIS — M8008XD Age-related osteoporosis with current pathological fracture, vertebra(e), subsequent encounter for fracture with routine healing: Secondary | ICD-10-CM | POA: Diagnosis not present

## 2023-12-08 DIAGNOSIS — I5042 Chronic combined systolic (congestive) and diastolic (congestive) heart failure: Secondary | ICD-10-CM | POA: Diagnosis not present

## 2023-12-08 DIAGNOSIS — D520 Dietary folate deficiency anemia: Secondary | ICD-10-CM | POA: Diagnosis not present

## 2023-12-08 DIAGNOSIS — E440 Moderate protein-calorie malnutrition: Secondary | ICD-10-CM | POA: Diagnosis not present

## 2023-12-09 ENCOUNTER — Telehealth: Payer: Self-pay

## 2023-12-09 DIAGNOSIS — T8131XA Disruption of external operation (surgical) wound, not elsewhere classified, initial encounter: Secondary | ICD-10-CM | POA: Diagnosis not present

## 2023-12-09 DIAGNOSIS — S31000A Unspecified open wound of lower back and pelvis without penetration into retroperitoneum, initial encounter: Secondary | ICD-10-CM | POA: Diagnosis not present

## 2023-12-09 NOTE — Telephone Encounter (Signed)
 ICM Call to patient for review of remote transmission and fluid levels.  She is a patient of Dr Gordan Latina.  Spoke with patient/daughter and heart failure questions reviewed.  Transmission results reviewed.   Hospitalized 3/21 resulting in dx of metastatic bladder cancer and now has a nephrostomy tube.    Requires ambulance for appointments due to she is unable to walk.  Pt's feet are severely swollen during the day making it difficult to stand when working with PT at home.      She is prescribed Furosemide 40 mg 1 tablet daily and she doubled the dosage on 4/15 with little change in swelling in her feet.    Forwarded to [place] Triage to review with Dr Krasowski for recommendations and follow up.   4/14 Optivol thoracic impedance report suggesting possible fluid accumulation started 3/22 and returned to normal 4/12.

## 2023-12-09 NOTE — Progress Notes (Signed)
 EPIC Encounter for ICM Monitoring  Patient Name: [name] is a 68 y.o. female Date: 12/09/2023 Primary Care Physican: Janece Means, FNP Primary Cardiologist: Gordan Latina Electrophysiologist: Lawana Pray 10/27/2023 Weight: 152 lbs (baseline 150 lbs) 11/04/2023 Weight: 150 lbs   Time in AT/AF <0.1 hr/day (<0.1%)                                                   Spoke with patient/daughter and heart failure questions reviewed.  Transmission results reviewed.    Pt's feet are severely swollen during the day making it difficult to stand when working with PT at home.  She has a nephrostomy tube.     Diet: NA   Optivol thoracic impedance suggesting possible fluid accumulation starting 3/22 and returned to normal 4/12.    Prescribed:  Furosemide 40 mg take 1 tablet(s) (40 mg total) by mouth every morning.   Labs: 11/25/2023 Creatinine 1.52, BUN 22, Potassium 3.3, Sodium 139, GFR 37  11/23/2023 Creatinine 1.38, BUN 21, Potassium 4.1, Sodium 138, GFR 41  11/21/2023 Creatinine 1.39, BUN 26, Potassium 4.3, Sodium 136, GFR 41  11/20/2023 Creatinine 1.31, BUN 26, Potassium 4.3, Sodium 137, GFR 44 11/19/2023 Creatinine 1.57, BUN 38, Potassium 4.6, Sodium 140, GFR 35  11/18/2023 Creatinine 1.83, BUN 45, Potassium 4.4, Sodium 140, GFR 30  11/17/2023 Creatinine 1.80, BUN 54, Potassium 4.5, Sodium 140, GFR 30  11/16/2023 Creatinine 1.75, BUN 61, Potassium 4.5, Sodium 141, GFR 31 11/15/2023 Creatinine 2.49, BUN 79, Potassium 4.1, Sodium 136, GFR 20  A complete set of results can be found in Results Review.   Recommendations:  Daughter doubled Lasix on 4/15 with minimal reduction of swelling in her feet.    Phone note sent to [facility] triage to review with Dr Deeann Fare for recommendations and follow up.    Follow-up plan: ICM clinic phone appointment on 12/22/2023 to recheck fluid levels.   91 day device clinic remote transmission 03/03/2024.     EP/Cardiology Office Visits: Recall 10/14/2024 with Dr.  Lawana Pray.    Last visit with Dr Gordan Latina was 06/25/2023 and recommended to follow up in a month.     Copy of ICM check sent to Dr. Lawana Pray.    3 month ICM trend: 12/08/2023.    12-14 Month ICM trend:     Almyra Jain, RN 12/09/2023 9:03 AM

## 2023-12-10 ENCOUNTER — Other Ambulatory Visit: Payer: Self-pay

## 2023-12-10 DIAGNOSIS — I420 Dilated cardiomyopathy: Secondary | ICD-10-CM | POA: Diagnosis not present

## 2023-12-10 DIAGNOSIS — D72829 Elevated white blood cell count, unspecified: Secondary | ICD-10-CM | POA: Diagnosis not present

## 2023-12-10 DIAGNOSIS — N179 Acute kidney failure, unspecified: Secondary | ICD-10-CM | POA: Diagnosis not present

## 2023-12-10 DIAGNOSIS — M47814 Spondylosis without myelopathy or radiculopathy, thoracic region: Secondary | ICD-10-CM | POA: Diagnosis not present

## 2023-12-10 DIAGNOSIS — N1330 Unspecified hydronephrosis: Secondary | ICD-10-CM | POA: Diagnosis not present

## 2023-12-10 DIAGNOSIS — E440 Moderate protein-calorie malnutrition: Secondary | ICD-10-CM | POA: Diagnosis not present

## 2023-12-10 DIAGNOSIS — G8220 Paraplegia, unspecified: Secondary | ICD-10-CM | POA: Diagnosis not present

## 2023-12-10 DIAGNOSIS — E611 Iron deficiency: Secondary | ICD-10-CM | POA: Diagnosis not present

## 2023-12-10 DIAGNOSIS — D631 Anemia in chronic kidney disease: Secondary | ICD-10-CM | POA: Diagnosis not present

## 2023-12-10 DIAGNOSIS — G8929 Other chronic pain: Secondary | ICD-10-CM | POA: Diagnosis not present

## 2023-12-10 DIAGNOSIS — D630 Anemia in neoplastic disease: Secondary | ICD-10-CM | POA: Diagnosis not present

## 2023-12-10 DIAGNOSIS — I5042 Chronic combined systolic (congestive) and diastolic (congestive) heart failure: Secondary | ICD-10-CM | POA: Diagnosis not present

## 2023-12-10 DIAGNOSIS — E785 Hyperlipidemia, unspecified: Secondary | ICD-10-CM | POA: Diagnosis not present

## 2023-12-10 DIAGNOSIS — M069 Rheumatoid arthritis, unspecified: Secondary | ICD-10-CM | POA: Diagnosis not present

## 2023-12-10 DIAGNOSIS — M8008XD Age-related osteoporosis with current pathological fracture, vertebra(e), subsequent encounter for fracture with routine healing: Secondary | ICD-10-CM | POA: Diagnosis not present

## 2023-12-10 DIAGNOSIS — J9611 Chronic respiratory failure with hypoxia: Secondary | ICD-10-CM | POA: Diagnosis not present

## 2023-12-10 DIAGNOSIS — F5101 Primary insomnia: Secondary | ICD-10-CM | POA: Diagnosis not present

## 2023-12-10 DIAGNOSIS — M4726 Other spondylosis with radiculopathy, lumbar region: Secondary | ICD-10-CM | POA: Diagnosis not present

## 2023-12-10 DIAGNOSIS — J449 Chronic obstructive pulmonary disease, unspecified: Secondary | ICD-10-CM | POA: Diagnosis not present

## 2023-12-10 DIAGNOSIS — C679 Malignant neoplasm of bladder, unspecified: Secondary | ICD-10-CM | POA: Diagnosis not present

## 2023-12-10 DIAGNOSIS — M899 Disorder of bone, unspecified: Secondary | ICD-10-CM | POA: Diagnosis not present

## 2023-12-10 DIAGNOSIS — N1832 Chronic kidney disease, stage 3b: Secondary | ICD-10-CM | POA: Diagnosis not present

## 2023-12-10 DIAGNOSIS — D520 Dietary folate deficiency anemia: Secondary | ICD-10-CM | POA: Diagnosis not present

## 2023-12-10 LAB — CUP PACEART REMOTE DEVICE CHECK
Battery Remaining Longevity: 80 mo
Battery Voltage: 2.93 V
Brady Statistic AP VP Percent: 0.01 %
Brady Statistic AP VS Percent: 5.72 %
Brady Statistic AS VP Percent: 0.04 %
Brady Statistic AS VS Percent: 94.23 %
Brady Statistic RA Percent Paced: 5.71 %
Brady Statistic RV Percent Paced: 0.04 %
Date Time Interrogation Session: 20250414073726
HighPow Impedance: 82 Ohm
Implantable Lead Connection Status: 753985
Implantable Lead Connection Status: 753985
Implantable Lead Implant Date: 20120502
Implantable Lead Implant Date: 20120502
Implantable Lead Location: 753859
Implantable Lead Location: 753860
Implantable Lead Model: 4076
Implantable Lead Model: 7122
Implantable Pulse Generator Implant Date: 20200925
Lead Channel Impedance Value: 361 Ohm
Lead Channel Impedance Value: 722 Ohm
Lead Channel Impedance Value: 817 Ohm
Lead Channel Pacing Threshold Amplitude: 0.375 V
Lead Channel Pacing Threshold Amplitude: 0.875 V
Lead Channel Pacing Threshold Pulse Width: 0.4 ms
Lead Channel Pacing Threshold Pulse Width: 0.4 ms
Lead Channel Sensing Intrinsic Amplitude: 13.75 mV
Lead Channel Sensing Intrinsic Amplitude: 13.75 mV
Lead Channel Sensing Intrinsic Amplitude: 3.875 mV
Lead Channel Sensing Intrinsic Amplitude: 3.875 mV
Lead Channel Setting Pacing Amplitude: 1.5 V
Lead Channel Setting Pacing Amplitude: 2 V
Lead Channel Setting Pacing Pulse Width: 0.4 ms
Lead Channel Setting Sensing Sensitivity: 0.3 mV
Zone Setting Status: 755011
Zone Setting Status: 755011

## 2023-12-11 ENCOUNTER — Telehealth: Payer: Self-pay

## 2023-12-11 ENCOUNTER — Telehealth: Payer: Self-pay | Admitting: Family Medicine

## 2023-12-11 DIAGNOSIS — J449 Chronic obstructive pulmonary disease, unspecified: Secondary | ICD-10-CM | POA: Diagnosis not present

## 2023-12-11 NOTE — Telephone Encounter (Signed)
 Informed by L. Archambault, RN that pt does not have an appointment for [facility] placement and she is supposed to begin treatment on 12/18/23, which will include Padcev. [name] placed an order for [facility] placement on 12/01/23. TC to pt and reached her daughter, who placed call on speaker phone w/ pt. Pt is in agreement w/ PAC placement, and she would like to be scheduled for this on 12/15/23 prior to her lab and chemo education appointments if possible due to the need for ambulance transport. TC to IR x 2 without answer. Left VM requesting a call to Dr. Joseph Nickel office, stating there is a patient that needs scheduling for [facility] placement as soon as possible.

## 2023-12-11 NOTE — Telephone Encounter (Signed)
 [employer] Standard Written Order

## 2023-12-11 NOTE — Progress Notes (Signed)
 Pharmacist Chemotherapy Monitoring - Initial Assessment    Anticipated start date: 12/18/23   The following has been reviewed per standard work regarding the patient's treatment regimen: The patient's diagnosis, treatment plan and drug doses, and organ/hematologic function Lab orders and baseline tests specific to treatment regimen  The treatment plan start date, drug sequencing, and pre-medications Prior authorization status  Patient's documented medication list, including drug-drug interaction screen and prescriptions for anti-emetics and supportive care specific to the treatment regimen The drug concentrations, fluid compatibility, administration routes, and timing of the medications to be used The patient's access for treatment and lifetime cumulative dose history, if applicable  The patient's medication allergies and previous infusion related reactions, if applicable   Changes made to treatment plan:  N/A  Follow up needed:  Port placement   Kara Orts, PharmD, MBA

## 2023-12-12 ENCOUNTER — Other Ambulatory Visit: Payer: Self-pay | Admitting: *Deleted

## 2023-12-12 ENCOUNTER — Ambulatory Visit (HOSPITAL_COMMUNITY)
Admission: RE | Admit: 2023-12-12 | Discharge: 2023-12-12 | Disposition: A | Discharge status: Home / Self Care | Source: Ambulatory Visit

## 2023-12-12 DIAGNOSIS — C679 Malignant neoplasm of bladder, unspecified: Secondary | ICD-10-CM | POA: Insufficient documentation

## 2023-12-12 DIAGNOSIS — C7951 Secondary malignant neoplasm of bone: Secondary | ICD-10-CM | POA: Diagnosis not present

## 2023-12-12 DIAGNOSIS — Z743 Need for continuous supervision: Secondary | ICD-10-CM | POA: Diagnosis not present

## 2023-12-12 DIAGNOSIS — Z7401 Bed confinement status: Secondary | ICD-10-CM | POA: Diagnosis not present

## 2023-12-12 DIAGNOSIS — R531 Weakness: Secondary | ICD-10-CM | POA: Diagnosis not present

## 2023-12-12 LAB — GLUCOSE, CAPILLARY: Glucose-Capillary: 101 mg/dL — ABNORMAL HIGH (ref 70–99)

## 2023-12-12 NOTE — Telephone Encounter (Signed)
 Spoke with pt and her daughter who states that pt has to come by EMS for her appointment. [name] states her mom starts chemo Monday. [name] daughter will call back after she talks with EMS for an appointment.

## 2023-12-15 ENCOUNTER — Telehealth: Payer: Self-pay

## 2023-12-15 ENCOUNTER — Other Ambulatory Visit: Payer: Self-pay

## 2023-12-15 ENCOUNTER — Inpatient Hospital Stay

## 2023-12-15 ENCOUNTER — Telehealth: Payer: Self-pay | Admitting: *Deleted

## 2023-12-15 DIAGNOSIS — C679 Malignant neoplasm of bladder, unspecified: Secondary | ICD-10-CM | POA: Diagnosis not present

## 2023-12-15 DIAGNOSIS — D649 Anemia, unspecified: Secondary | ICD-10-CM

## 2023-12-15 DIAGNOSIS — N179 Acute kidney failure, unspecified: Secondary | ICD-10-CM | POA: Diagnosis not present

## 2023-12-15 DIAGNOSIS — R531 Weakness: Secondary | ICD-10-CM | POA: Diagnosis not present

## 2023-12-15 DIAGNOSIS — Z5112 Encounter for antineoplastic immunotherapy: Secondary | ICD-10-CM | POA: Diagnosis not present

## 2023-12-15 DIAGNOSIS — N1832 Chronic kidney disease, stage 3b: Secondary | ICD-10-CM | POA: Diagnosis not present

## 2023-12-15 DIAGNOSIS — Z79891 Long term (current) use of opiate analgesic: Secondary | ICD-10-CM | POA: Diagnosis not present

## 2023-12-15 DIAGNOSIS — M8008XS Age-related osteoporosis with current pathological fracture, vertebra(e), sequela: Secondary | ICD-10-CM | POA: Diagnosis not present

## 2023-12-15 DIAGNOSIS — G893 Neoplasm related pain (acute) (chronic): Secondary | ICD-10-CM | POA: Diagnosis not present

## 2023-12-15 DIAGNOSIS — D509 Iron deficiency anemia, unspecified: Secondary | ICD-10-CM | POA: Diagnosis not present

## 2023-12-15 DIAGNOSIS — Z743 Need for continuous supervision: Secondary | ICD-10-CM | POA: Diagnosis not present

## 2023-12-15 LAB — CBC WITH DIFFERENTIAL (CANCER CENTER ONLY)
Abs Immature Granulocytes: 0.05 10*3/uL (ref 0.00–0.07)
Basophils Absolute: 0.1 10*3/uL (ref 0.0–0.1)
Basophils Relative: 1 %
Eosinophils Absolute: 0.2 10*3/uL (ref 0.0–0.5)
Eosinophils Relative: 2 %
HCT: 32.2 % — ABNORMAL LOW (ref 36.0–46.0)
Hemoglobin: 10.6 g/dL — ABNORMAL LOW (ref 12.0–15.0)
Immature Granulocytes: 1 %
Lymphocytes Relative: 15 %
Lymphs Abs: 1.4 10*3/uL (ref 0.7–4.0)
MCH: 29.8 pg (ref 26.0–34.0)
MCHC: 32.9 g/dL (ref 30.0–36.0)
MCV: 90.4 fL (ref 80.0–100.0)
Monocytes Absolute: 0.9 10*3/uL (ref 0.1–1.0)
Monocytes Relative: 9 %
Neutro Abs: 6.9 10*3/uL (ref 1.7–7.7)
Neutrophils Relative %: 72 %
Platelet Count: 303 10*3/uL (ref 150–400)
RBC: 3.56 MIL/uL — ABNORMAL LOW (ref 3.87–5.11)
RDW: 13.2 % (ref 11.5–15.5)
WBC Count: 9.5 10*3/uL (ref 4.0–10.5)
nRBC: 0 % (ref 0.0–0.2)

## 2023-12-15 LAB — T4, FREE: Free T4: 1.31 ng/dL — ABNORMAL HIGH (ref 0.61–1.12)

## 2023-12-15 LAB — CMP (CANCER CENTER ONLY)
ALT: 7 U/L (ref 0–44)
AST: 13 U/L — ABNORMAL LOW (ref 15–41)
Albumin: 3.7 g/dL (ref 3.5–5.0)
Alkaline Phosphatase: 102 U/L (ref 38–126)
Anion gap: 14 (ref 5–15)
BUN: 43 mg/dL — ABNORMAL HIGH (ref 8–23)
CO2: 25 mmol/L (ref 22–32)
Calcium: 9.9 mg/dL (ref 8.9–10.3)
Chloride: 97 mmol/L — ABNORMAL LOW (ref 98–111)
Creatinine: 2.36 mg/dL — ABNORMAL HIGH (ref 0.44–1.00)
GFR, Estimated: 22 mL/min — ABNORMAL LOW (ref >60)
Glucose, Bld: 93 mg/dL (ref 70–99)
Potassium: 3.4 mmol/L — ABNORMAL LOW (ref 3.5–5.1)
Sodium: 136 mmol/L (ref 135–145)
Total Bilirubin: 0.5 mg/dL (ref 0.0–1.2)
Total Protein: 6.9 g/dL (ref 6.5–8.1)

## 2023-12-15 NOTE — Telephone Encounter (Signed)
 Ms [name] came to education class. She cc of pain 10/10 in neck and down spine.  She see Dr. Tommy Frames in [facility] with [facility]  for pain management. Pt 's [name] said that his office could not refill Oxycodone  10 mg tablets as they need up dated information regarding current medical condition that would warrant pain med increase.  Pt was placed on Oxycodone  15 mg q 8 hours prn pain in hospital. Upon discharge Pt was not given prescriptions for oxycodone  15 mg.  She was discharged to SNF but did not stay as daughter was concerned about her care after being there for several hours. Daughter called 911 to have her brought to hospital. From ED she went home. She has bee taking oxycodone  10 mg tabs given by [name] every 3 hours for pain. She has 3 pill left. [name] came and spoke with patient during edu session and requested a not be sent to his nurse and himself to call [name] office Tuesday 12-16-23 am and discuss current condition and or fax them the discharge summary from 11-24-23 and see if they can prescribe the 15 mg tabs. [name] cannot prescribe pain medications without discussing with pain clinic as she probably signed a pain contract. Dr. Tommy Frames office number is [phone]  Ms Decelles medicaion history shows that a prescription for Oxycodone  15 mg was printed on 11-24-23 (day of discharge) maybe this went to SNF. On 11-25-23 the Doctor in the ED dc'd 3-31-prescription and printed another one for oxycodone  15 mg 1 q 8 hrs prn #10. Family says they never received a prescription.

## 2023-12-15 NOTE — Telephone Encounter (Addendum)
 First treatment is 12/18/23. [name] placed an order for [facility] placement on 12/01/23. RN called IR on 4/17 and LVM to contact this office and/or patient to schedule port placement. Patient here today for lab and chemo education appointments, arriving via ambulance transport on stretcher. Family arrived at [facility] to participate in patient education. Patient was transferred from ambulance stretcher to wheelchair by ambulance staff following lab appt for patient education appt. Daughter asked if there was an update on scheduling patient's port, stating radiology has not contacted her or patient.   Advised her this office had not heard from IR today to schedule and that IR may contact them first, but office will contact them as soon as appt scheduled. Per Dr. Joseph Nickel direction contacted IR and LVM requesting a call to Dr. Joseph Nickel office, with patient demographics, stating patient needs to be scheduled for port placement as soon as possible.

## 2023-12-16 ENCOUNTER — Telehealth: Payer: Self-pay

## 2023-12-16 ENCOUNTER — Other Ambulatory Visit: Payer: Self-pay

## 2023-12-16 DIAGNOSIS — M5489 Other dorsalgia: Secondary | ICD-10-CM

## 2023-12-16 DIAGNOSIS — C679 Malignant neoplasm of bladder, unspecified: Secondary | ICD-10-CM

## 2023-12-16 LAB — TSH: TSH: 0.399 u[IU]/mL (ref 0.350–4.500)

## 2023-12-16 MED ORDER — OXYCODONE HCL 15 MG PO TABS: 15.0000 mg | ORAL_TABLET | Freq: Three times a day (TID) | ORAL | 0 refills | Status: DC | PRN

## 2023-12-16 MED ORDER — OXYCODONE HCL 15 MG PO TABS
15.0000 mg | ORAL_TABLET | Freq: Three times a day (TID) | ORAL | 0 refills | Status: DC | PRN
Start: 2023-12-16 — End: 2023-12-26

## 2023-12-16 NOTE — Telephone Encounter (Signed)
 TC to pt's daughter to inform of the below message by [name]. She verbalizes understanding and states she will contact Dr. Gordan Latina [facility] cardiologist) regarding pt's renal function and lasix  dose. Informed daughter that [name] has called in a new Oxycodone  Rx to the [facility] in [place] after consulting w/ [name] and determining that pt did not have a pain contract in place. ([name] requested that [name] manage pain medication.) Also advised daughter that a palliative care consult has been ordered for pt and that someone should be calling to set up this appointment. Answered questions regarding palliative care, and she verbalizes understanding. Daughter reports that pt is scheduled for [facility] placement on 4/30. Will inform [name].

## 2023-12-16 NOTE — Telephone Encounter (Signed)
-----   Message from Lowanda Ruddy sent at 12/16/2023 10:40 AM EDT ----- Ethyl Hering please let patient know to increase fluid intake. Her renal fxn is worse. Please ask her or daughter to let cardiologist know because she was placed on Lasix  and see if they want to hold or decrease dose with worsening renal function. Thanks.

## 2023-12-16 NOTE — Assessment & Plan Note (Addendum)
Ferrous sulfate 325 mg daily

## 2023-12-16 NOTE — Assessment & Plan Note (Addendum)
Folic acid 1mg daily

## 2023-12-16 NOTE — Assessment & Plan Note (Addendum)
 H/o osteoporosis and now with bone metatases Communicated with her pain management team Will coordinate with palliative care Continue oxydocone

## 2023-12-16 NOTE — Assessment & Plan Note (Addendum)
 Start EV/P today Referral to palliative care. Will meet them on 5/1 Follow up before D8

## 2023-12-16 NOTE — Assessment & Plan Note (Addendum)
 Recommend dental evaluation Zometa can be start after dental evaluation Continue vitamin D /Ca Continue PT OT

## 2023-12-16 NOTE — Progress Notes (Unsigned)
 [facility] OFFICE PROGRESS NOTE  Patient Care Team: Janece Means, FNP as PCP - General (Family Medicine) Lei Pump, MD as PCP - Electrophysiology (Cardiology) Manfred Seed, MD as PCP - Cardiology (Cardiology) Garry Kansas, MD as Consulting Physician (Neurosurgery) Manfred Seed, MD as Consulting Physician (Cardiology) Candyce Champagne, MD as Consulting Physician (General Surgery) Merriam Abbey, DO as Consulting Physician (Neurology) Steven Elam, [facility] (Inactive) as Pharmacist (Pharmacist) Genell Ken, MD as Consulting Physician (Gastroenterology) Zehr, Martina Sledge, PA-C as Physician Assistant (Gastroenterology) Cristi Donalds, MD as Consulting Physician (Nephrology)  68 y.o.female with past medical history of COPD, chronic back from from DDD, osteoporosis with fractures presenting with back pain radiating down to the hip in March of 2025 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  Cystoscopy with TURBT show right sided bladder tumor with palpable anterior vaginal mass.  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   Current diagnosis: Stage IVB UC with bone metastases Treatment: EV/P  Discussed diagnosis of stage IV UC again. Discussed PET findings. Treatment is palliative and not curative. Given her histology, RR and survival may varied. Discussed most people will obtain disease control and partial response. Important to monitor for progression, side effects. Discussed skin toxicities, management and prophylactic measures. Daughter will buy Desitin, moisturizers and start using. She will let us  know if developing any new concerning side effects.  Her EF was normal last year. Significant improvement from 3 years ago. She has decrease appetite and oral intake and still on Lasix . Mucosa is dry and BP is low today. Recommend holding Lasix  and potassium for now. Increase fluid for her  AKI. Will reassess next week.   Trial of marinol  5 mg twice daily for loss of appetite.  Discussed palliative care, meeting, ACP.  I spent a total of 59 minutes including review of chart and various tests results, face-to-face time with the patient, discussions about results, plan of care and coordination of care plan with other providers and staff members. Discussed imaging findings, treatment plan, side effect management.  Assessment & Plan Urothelial carcinoma of bladder (HCC) Start EV/P today Referral to palliative care. Will meet them on 5/1 Follow up before D8  Dietary folate deficiency anemia Folic acid  1 mg daily Iron deficiency Ferrous sulfate  325 mg daily Pathological fracture of vertebra due to neoplastic disease, sequela New upper back, neck. H/o old LBP and hip pain. Recommend dental evaluation. Daughter will call for urgent appointment for left sided pain before starting zometa Zometa vs xgeva  can be start after dental evaluation. Will repeat Cr next week Continue vitamin D /Ca Continue PT OT Other chronic back pain H/o DDD, osteoporosis and now with bone metatases Communicated with her pain management team Will coordinate with palliative care Continue oxydocone Age-related osteoporosis with current pathological fracture, initial encounter History of known osteoporosis and fractures. Recommend BMA. AKI (acute kidney injury) (HCC) Worsening this week. Hold lasix  Continue adequate hydration At risk for side effect of medication  Toxicities being monitored in relation to EV Monitor for skin toxicity Prophylactic Desitin twice daily to intertriginous, flexural, and acral areas Apply emollient moisturizer twice a day Use sunscreen if outside for prolonged period of time. Hydration 64+ ounces a day Avoid hot showers Monitor for neuropathy Monitor for hyperglycemia.  Baseline A1c Monitor for signs of pneumonitis Warning signs: Malaise.  Fever of more than 100.4.   Mucosal involvement in the ocular, oral or genital area.  Skin pain, burning, numbness tingling. Monitor for diarrhea and other GI symptoms  Monitor irAE.  Decreased appetite Start marinol  5 mg twice daily.  Lowanda Ruddy, MD  INTERVAL HISTORY: she returns for treatment follow-up with daughter. Report cannot sit on long time and required ambulance for transport.   More anterior pelvic pain by the bladder. New cervical, neck pain. Previous history of lower back pain and hip pain unchanged. She has good u/o. Not drinking or eating as much with loss of appetite.  Oncology History  Urothelial carcinoma of bladder (HCC)  11/18/2023 Initial Diagnosis   Urothelial carcinoma of bladder (HCC)   12/01/2023 Cancer Staging   Staging form: Urinary Bladder, AJCC 8th Edition - Clinical: Stage IVB (cTX, cNX, pM1b) - Signed by Lowanda Ruddy, MD on 12/01/2023 WHO/ISUP grade (low/high): High Grade Histologic grading system: 2 grade system   12/18/2023 -  Chemotherapy   Patient is on Treatment Plan : UROTHELIAL ADVANCED, METASTATIC ENFORTUMAB D1, D8 + PEMBROLIZUMAB  (200) D1 Q21D        PHYSICAL EXAMINATION: ECOG PERFORMANCE STATUS: 2 - Symptomatic, <50% confined to bed  Vitals:   12/18/23 1329  BP: 98/73  Pulse: (!) 101  Resp: 15  Temp: 98.5 F (36.9 C)  SpO2: 100%   Filed Weights   On wheel chair Mild lower extremity swelling  Relevant data reviewed during this visit included labs and imaging.

## 2023-12-17 DIAGNOSIS — D631 Anemia in chronic kidney disease: Secondary | ICD-10-CM | POA: Diagnosis not present

## 2023-12-17 DIAGNOSIS — J9611 Chronic respiratory failure with hypoxia: Secondary | ICD-10-CM | POA: Diagnosis not present

## 2023-12-17 DIAGNOSIS — I5042 Chronic combined systolic (congestive) and diastolic (congestive) heart failure: Secondary | ICD-10-CM | POA: Diagnosis not present

## 2023-12-17 DIAGNOSIS — G8220 Paraplegia, unspecified: Secondary | ICD-10-CM | POA: Diagnosis not present

## 2023-12-17 DIAGNOSIS — M4726 Other spondylosis with radiculopathy, lumbar region: Secondary | ICD-10-CM | POA: Diagnosis not present

## 2023-12-17 DIAGNOSIS — C679 Malignant neoplasm of bladder, unspecified: Secondary | ICD-10-CM | POA: Diagnosis not present

## 2023-12-17 DIAGNOSIS — D520 Dietary folate deficiency anemia: Secondary | ICD-10-CM | POA: Diagnosis not present

## 2023-12-17 DIAGNOSIS — N179 Acute kidney failure, unspecified: Secondary | ICD-10-CM | POA: Diagnosis not present

## 2023-12-17 DIAGNOSIS — G8929 Other chronic pain: Secondary | ICD-10-CM | POA: Diagnosis not present

## 2023-12-17 DIAGNOSIS — M069 Rheumatoid arthritis, unspecified: Secondary | ICD-10-CM | POA: Diagnosis not present

## 2023-12-17 DIAGNOSIS — I420 Dilated cardiomyopathy: Secondary | ICD-10-CM | POA: Diagnosis not present

## 2023-12-17 DIAGNOSIS — F5101 Primary insomnia: Secondary | ICD-10-CM | POA: Diagnosis not present

## 2023-12-17 DIAGNOSIS — E440 Moderate protein-calorie malnutrition: Secondary | ICD-10-CM | POA: Diagnosis not present

## 2023-12-17 DIAGNOSIS — M899 Disorder of bone, unspecified: Secondary | ICD-10-CM | POA: Diagnosis not present

## 2023-12-17 DIAGNOSIS — D72829 Elevated white blood cell count, unspecified: Secondary | ICD-10-CM | POA: Diagnosis not present

## 2023-12-17 DIAGNOSIS — E611 Iron deficiency: Secondary | ICD-10-CM | POA: Diagnosis not present

## 2023-12-17 DIAGNOSIS — N1832 Chronic kidney disease, stage 3b: Secondary | ICD-10-CM | POA: Diagnosis not present

## 2023-12-17 DIAGNOSIS — J449 Chronic obstructive pulmonary disease, unspecified: Secondary | ICD-10-CM | POA: Diagnosis not present

## 2023-12-17 DIAGNOSIS — M8008XD Age-related osteoporosis with current pathological fracture, vertebra(e), subsequent encounter for fracture with routine healing: Secondary | ICD-10-CM | POA: Diagnosis not present

## 2023-12-17 DIAGNOSIS — N1330 Unspecified hydronephrosis: Secondary | ICD-10-CM | POA: Diagnosis not present

## 2023-12-17 DIAGNOSIS — E785 Hyperlipidemia, unspecified: Secondary | ICD-10-CM | POA: Diagnosis not present

## 2023-12-17 DIAGNOSIS — M47814 Spondylosis without myelopathy or radiculopathy, thoracic region: Secondary | ICD-10-CM | POA: Diagnosis not present

## 2023-12-17 DIAGNOSIS — D630 Anemia in neoplastic disease: Secondary | ICD-10-CM | POA: Diagnosis not present

## 2023-12-18 ENCOUNTER — Telehealth: Payer: Self-pay | Admitting: Family Medicine

## 2023-12-18 ENCOUNTER — Inpatient Hospital Stay

## 2023-12-18 ENCOUNTER — Telehealth: Payer: Self-pay

## 2023-12-18 VITALS — BP 98/73 | HR 101 | Temp 98.5°F | Resp 15

## 2023-12-18 DIAGNOSIS — Z5112 Encounter for antineoplastic immunotherapy: Secondary | ICD-10-CM | POA: Diagnosis not present

## 2023-12-18 DIAGNOSIS — Z79891 Long term (current) use of opiate analgesic: Secondary | ICD-10-CM | POA: Diagnosis not present

## 2023-12-18 DIAGNOSIS — C679 Malignant neoplasm of bladder, unspecified: Secondary | ICD-10-CM | POA: Diagnosis not present

## 2023-12-18 DIAGNOSIS — D509 Iron deficiency anemia, unspecified: Secondary | ICD-10-CM | POA: Diagnosis not present

## 2023-12-18 DIAGNOSIS — M5489 Other dorsalgia: Secondary | ICD-10-CM | POA: Diagnosis not present

## 2023-12-18 DIAGNOSIS — G893 Neoplasm related pain (acute) (chronic): Secondary | ICD-10-CM | POA: Diagnosis not present

## 2023-12-18 DIAGNOSIS — R630 Anorexia: Secondary | ICD-10-CM

## 2023-12-18 DIAGNOSIS — Z9189 Other specified personal risk factors, not elsewhere classified: Secondary | ICD-10-CM

## 2023-12-18 DIAGNOSIS — M8008XS Age-related osteoporosis with current pathological fracture, vertebra(e), sequela: Secondary | ICD-10-CM | POA: Diagnosis not present

## 2023-12-18 DIAGNOSIS — R262 Difficulty in walking, not elsewhere classified: Secondary | ICD-10-CM | POA: Diagnosis not present

## 2023-12-18 DIAGNOSIS — R6889 Other general symptoms and signs: Secondary | ICD-10-CM | POA: Diagnosis not present

## 2023-12-18 DIAGNOSIS — Z7401 Bed confinement status: Secondary | ICD-10-CM | POA: Diagnosis not present

## 2023-12-18 DIAGNOSIS — N179 Acute kidney failure, unspecified: Secondary | ICD-10-CM | POA: Diagnosis not present

## 2023-12-18 DIAGNOSIS — M8458XS Pathological fracture in neoplastic disease, other specified site, sequela: Secondary | ICD-10-CM | POA: Diagnosis not present

## 2023-12-18 DIAGNOSIS — N1832 Chronic kidney disease, stage 3b: Secondary | ICD-10-CM | POA: Diagnosis not present

## 2023-12-18 DIAGNOSIS — G8929 Other chronic pain: Secondary | ICD-10-CM

## 2023-12-18 DIAGNOSIS — E611 Iron deficiency: Secondary | ICD-10-CM

## 2023-12-18 DIAGNOSIS — D520 Dietary folate deficiency anemia: Secondary | ICD-10-CM

## 2023-12-18 DIAGNOSIS — M8000XA Age-related osteoporosis with current pathological fracture, unspecified site, initial encounter for fracture: Secondary | ICD-10-CM

## 2023-12-18 DIAGNOSIS — Z743 Need for continuous supervision: Secondary | ICD-10-CM | POA: Diagnosis not present

## 2023-12-18 DIAGNOSIS — D649 Anemia, unspecified: Secondary | ICD-10-CM

## 2023-12-18 HISTORY — DX: Other specified personal risk factors, not elsewhere classified: Z91.89

## 2023-12-18 HISTORY — DX: Anorexia: R63.0

## 2023-12-18 LAB — CBC WITH DIFFERENTIAL (CANCER CENTER ONLY)
Abs Immature Granulocytes: 0.07 10*3/uL (ref 0.00–0.07)
Basophils Absolute: 0.1 10*3/uL (ref 0.0–0.1)
Basophils Relative: 1 %
Eosinophils Absolute: 0.2 10*3/uL (ref 0.0–0.5)
Eosinophils Relative: 2 %
HCT: 30.5 % — ABNORMAL LOW (ref 36.0–46.0)
Hemoglobin: 10.1 g/dL — ABNORMAL LOW (ref 12.0–15.0)
Immature Granulocytes: 1 %
Lymphocytes Relative: 15 %
Lymphs Abs: 1.6 10*3/uL (ref 0.7–4.0)
MCH: 30 pg (ref 26.0–34.0)
MCHC: 33.1 g/dL (ref 30.0–36.0)
MCV: 90.5 fL (ref 80.0–100.0)
Monocytes Absolute: 0.8 10*3/uL (ref 0.1–1.0)
Monocytes Relative: 8 %
Neutro Abs: 7.5 10*3/uL (ref 1.7–7.7)
Neutrophils Relative %: 73 %
Platelet Count: 302 10*3/uL (ref 150–400)
RBC: 3.37 MIL/uL — ABNORMAL LOW (ref 3.87–5.11)
RDW: 13.3 % (ref 11.5–15.5)
WBC Count: 10.2 10*3/uL (ref 4.0–10.5)
nRBC: 0 % (ref 0.0–0.2)

## 2023-12-18 LAB — BASIC METABOLIC PANEL - CANCER CENTER ONLY
Anion gap: 10 (ref 5–15)
BUN: 50 mg/dL — ABNORMAL HIGH (ref 8–23)
CO2: 27 mmol/L (ref 22–32)
Calcium: 9.4 mg/dL (ref 8.9–10.3)
Chloride: 99 mmol/L (ref 98–111)
Creatinine: 2.4 mg/dL — ABNORMAL HIGH (ref 0.44–1.00)
GFR, Estimated: 21 mL/min — ABNORMAL LOW (ref >60)
Glucose, Bld: 99 mg/dL (ref 70–99)
Potassium: 3.4 mmol/L — ABNORMAL LOW (ref 3.5–5.1)
Sodium: 136 mmol/L (ref 135–145)

## 2023-12-18 LAB — T4, FREE: Free T4: 1.08 ng/dL (ref 0.61–1.12)

## 2023-12-18 LAB — TSH: TSH: 0.385 u[IU]/mL (ref 0.350–4.500)

## 2023-12-18 LAB — SAMPLE TO BLOOD BANK

## 2023-12-18 MED ORDER — PEMBROLIZUMAB CHEMO INJECTION 100 MG/4ML
200.0000 mg | Freq: Once | INTRAVENOUS | Status: AC
  Administered 2023-12-18: 200 mg via INTRAVENOUS
  Filled 2023-12-18: qty 200

## 2023-12-18 MED ORDER — DRONABINOL 5 MG PO CAPS: 5.0000 mg | ORAL_CAPSULE | Freq: Two times a day (BID) | ORAL | 0 refills | Status: DC

## 2023-12-18 MED ORDER — SODIUM CHLORIDE 0.9 % IV SOLN
1.2500 mg/kg | Freq: Once | INTRAVENOUS | Status: AC
  Administered 2023-12-18: 100 mg via INTRAVENOUS
  Filled 2023-12-18: qty 10

## 2023-12-18 MED ORDER — SODIUM CHLORIDE 0.9 % IV SOLN: INTRAVENOUS | Status: DC

## 2023-12-18 MED ORDER — PROCHLORPERAZINE MALEATE 10 MG PO TABS
10.0000 mg | ORAL_TABLET | Freq: Once | ORAL | Status: AC
Start: 2023-12-18 — End: 2023-12-18
  Administered 2023-12-18: 10 mg via ORAL
  Filled 2023-12-18: qty 1

## 2023-12-18 NOTE — Telephone Encounter (Signed)
 [facility] Coordination Note Report Verbal Order

## 2023-12-18 NOTE — Telephone Encounter (Signed)
 [facility] [phone]

## 2023-12-18 NOTE — Patient Instructions (Addendum)
 [facility] - A DEPT OF [place]  Discharge Instructions: Thank you for choosing [facility] to provide your oncology and hematology care.   If you have a lab appointment with the Cancer Center, please go directly to the Cancer Center and check in at the registration area.   Wear comfortable clothing and clothing appropriate for easy access to any Portacath or PICC line.   We strive to give you quality time with your provider. You may need to reschedule your appointment if you arrive late (15 or more minutes).  Arriving late affects you and other patients whose appointments are after yours.  Also, if you miss three or more appointments without notifying the office, you may be dismissed from the clinic at the provider's discretion.      For prescription refill requests, have your pharmacy contact our office and allow 72 hours for refills to be completed.    Today you received the following chemotherapy and/or immunotherapy agents: Padcev , Keytruda       To help prevent nausea and vomiting after your treatment, we encourage you to take your nausea medication as directed.  BELOW ARE SYMPTOMS THAT SHOULD BE REPORTED IMMEDIATELY: *FEVER GREATER THAN 100.4 F (38 C) OR HIGHER *CHILLS OR SWEATING *NAUSEA AND VOMITING THAT IS NOT CONTROLLED WITH YOUR NAUSEA MEDICATION *UNUSUAL SHORTNESS OF BREATH *UNUSUAL BRUISING OR BLEEDING *URINARY PROBLEMS (pain or burning when urinating, or frequent urination) *BOWEL PROBLEMS (unusual diarrhea, constipation, pain near the anus) TENDERNESS IN MOUTH AND THROAT WITH OR WITHOUT PRESENCE OF ULCERS (sore throat, sores in mouth, or a toothache) UNUSUAL RASH, SWELLING OR PAIN  UNUSUAL VAGINAL DISCHARGE OR ITCHING   Items with * indicate a potential emergency and should be followed up as soon as possible or go to the Emergency Department if any problems should occur.  Please show the CHEMOTHERAPY ALERT CARD or  IMMUNOTHERAPY ALERT CARD at check-in to the Emergency Department and triage nurse.  Should you have questions after your visit or need to cancel or reschedule your appointment, please contact [facility]  Dept: [phone]  and follow the prompts.  Office hours are 8:00 a.m. to 4:30 p.m. Monday - Friday. Please note that voicemails left after 4:00 p.m. may not be returned until the following business day.  We are closed weekends and major holidays. You have access to a nurse at all times for urgent questions. Please call the main number to the clinic Dept: [phone] and follow the prompts.   For any non-urgent questions, you may also contact your provider using MyChart. We now offer e-Visits for anyone 53 and older to request care online for non-urgent symptoms. For details visit [email or web address].   Also download the MyChart app! Go to the app store, search "MyChart", open the app, select [place], and log in with your MyChart username and password.  [facility] - A DEPT OF [facility]  Discharge Instructions: Thank you for choosing [facility] to provide your oncology and hematology care.   If you have a lab appointment with the Cancer Center, please go directly to the Cancer Center and check in at the registration area.   Wear comfortable clothing and clothing appropriate for easy access to any Portacath or PICC line.   We strive to give you quality time with your provider. You may need  to reschedule your appointment if you arrive late (15 or more minutes).  Arriving late affects you and other patients whose appointments are after yours.  Also, if you miss three or more appointments without notifying the office, you may be dismissed from the clinic at the provider's discretion.      For prescription refill requests, have your pharmacy contact our office and allow 72 hours for  refills to be completed.    Today you received the following chemotherapy and/or immunotherapy agents: enfortumab vedotin -ejfv (PADCEV ) and pembrolizumab  (KEYTRUDA )   To help prevent nausea and vomiting after your treatment, we encourage you to take your nausea medication as directed.  BELOW ARE SYMPTOMS THAT SHOULD BE REPORTED IMMEDIATELY: *FEVER GREATER THAN 100.4 F (38 C) OR HIGHER *CHILLS OR SWEATING *NAUSEA AND VOMITING THAT IS NOT CONTROLLED WITH YOUR NAUSEA MEDICATION *UNUSUAL SHORTNESS OF BREATH *UNUSUAL BRUISING OR BLEEDING *URINARY PROBLEMS (pain or burning when urinating, or frequent urination) *BOWEL PROBLEMS (unusual diarrhea, constipation, pain near the anus) TENDERNESS IN MOUTH AND THROAT WITH OR WITHOUT PRESENCE OF ULCERS (sore throat, sores in mouth, or a toothache) UNUSUAL RASH, SWELLING OR PAIN  UNUSUAL VAGINAL DISCHARGE OR ITCHING   Items with * indicate a potential emergency and should be followed up as soon as possible or go to the Emergency Department if any problems should occur.  Please show the CHEMOTHERAPY ALERT CARD or IMMUNOTHERAPY ALERT CARD at check-in to the Emergency Department and triage nurse.  Should you have questions after your visit or need to cancel or reschedule your appointment, please contact [facility]  Dept: [phone]  and follow the prompts.  Office hours are 8:00 a.m. to 4:30 p.m. Monday - Friday. Please note that voicemails left after 4:00 p.m. may not be returned until the following business day.  We are closed weekends and major holidays. You have access to a nurse at all times for urgent questions. Please call the main number to the clinic Dept: [phone] and follow the prompts.   For any non-urgent questions, you may also contact your provider using MyChart. We now offer e-Visits for anyone 79 and older to request care online for non-urgent symptoms. For details visit  [email or web address].   Also download the MyChart app! Go to the app store, search "MyChart", open the app, select [place], and log in with your MyChart username and password.  Enfortumab Vedotin  Injection What is this medication? ENFORTUMAB VEDOTIN  (en FORT ue mab ve DOE tin) treats bladder cancer and kidney cancer. It works by blocking a protein that causes cancer cells to grow and multiply. This helps to slow or stop the spread of cancer cells. This medicine may be used for other purposes; ask your health care provider or pharmacist if you have questions. COMMON BRAND NAME(S): PADCEV  What should I tell my care team before I take this medication? They need to know if you have any of these conditions: Diabetes Eye disease Liver disease Lung disease Tingling of the fingers or toes or other nerve disorder Vision problems An unusual or allergic reaction to enfortumab vedotin , other medications, foods, dyes, or preservatives Pregnant or trying to get pregnant Breast-feeding How should I use this medication? This medication is injected into a vein. It is given by your care team in a hospital or clinic setting. Talk to your care team about the use of this medication in children. Special care may be needed. Overdosage: If you  think you have taken too much of this medicine contact a poison control center or emergency room at once. NOTE: This medicine is only for you. Do not share this medicine with others. What if I miss a dose? Keep appointments for follow-up doses. It is important not to miss your dose. Call your care team if you are unable to keep an appointment. What may interact with this medication? This medication may affect how other medications work, and other medications may affect how this medication works. Talk with your care team about all of the medications you take. They may suggest changes to your treatment plan to lower the risk of side effects and to make sure your  medications work as intended. This list may not describe all possible interactions. Give your health care provider a list of all the medicines, herbs, non-prescription drugs, or dietary supplements you use. Also tell them if you smoke, drink alcohol, or use illegal drugs. Some items may interact with your medicine. What should I watch for while using this medication? Your condition will be monitored carefully while you are receiving this medication. This medication may make you feel generally unwell. This is not uncommon as chemotherapy can affect healthy cells as well as cancer cells. Report any side effects. Continue your course of treatment even though you feel ill unless your care team tells you to stop. This medication may increase blood sugar. The risk may be higher in patients who already have diabetes. Ask your care team what you can do to lower your risk of diabetes while taking this medication. This medication can cause a serious condition in which there is too much acid in your blood. If you develop nausea, vomiting, stomach pain, unusual tiredness, or trouble breathing, stop taking this medication and call your care team right away. If possible, use a ketone dipstick to check for ketones in your urine. This medication may cause dry eyes and blurred vision. If you wear contact lenses, you may feel some discomfort. Lubricating eye drops may help. See your care team if the problem does not go away or is severe. Tell your care team right away if you have any change in your eyesight. This medication may increase your risk of getting an infection. Call your care team for advice if you get a fever, chills, sore throat, or other symptoms of a cold or flu. Do not treat yourself. Try to avoid being around people who are sick. Avoid taking medications that contain aspirin , acetaminophen , ibuprofen , naproxen , or ketoprofen unless instructed by your care team. These medications may hide a fever. This  medication may cause serious skin reactions. They can happen weeks to months after starting the medication. Contact your care team right away if you notice fevers or flu-like symptoms with a rash. The rash may be red or purple and then turn into blisters or peeling of the skin. You may also notice a red rash with swelling of the face, lips or lymph nodes in your neck or under your arms. Talk to your care team if you or your partner may be pregnant. Serious birth defects can occur if you take this medication during pregnancy and for 2 months after the last dose. You will need a negative pregnancy test before starting this medication. Contraception is recommended while taking this medication and for 2 months after the last dose. Your care team can help you find the option that works for you. If your partner can get pregnant, use a condom during  sex while taking this medication and for 4 months after the last dose. Do not breastfeed while taking this medicine or for at least 3 weeks after the last dose. This medication may cause infertility. Talk to your care team if you are concerned about your fertility. What side effects may I notice from receiving this medication? Side effects that you should report to your care team as soon as possible: Allergic reactions--skin rash, itching, hives, swelling of the face, lips, tongue, or throat Dry cough, shortness of breath or trouble breathing Eye pain, redness, irritation, or discharge with blurry or decreased vision High blood sugar (hyperglycemia)--increased thirst or amount of urine, unusual weakness or fatigue, blurry vision Painful swelling, warmth, or redness of the skin, blisters or sores at the infusion site Pain, tingling, or numbness in the hands or feet Redness, blistering, peeling, or loosening of the skin, including inside the mouth Unusual bruising or bleeding Side effects that usually do not require medical attention (report these to your care team  if they continue or are bothersome): Change in taste Diarrhea Dry eyes Fatigue Hair loss Loss of appetite This list may not describe all possible side effects. Call your doctor for medical advice about side effects. You may report side effects to FDA at 1-800-FDA-1088. Where should I keep my medication? This medication is given in a hospital or clinic. It will not be stored at home. NOTE: This sheet is a summary. It may not cover all possible information. If you have questions about this medicine, talk to your doctor, pharmacist, or health care provider.  2024 Elsevier/Gold Standard (2021-12-25 00:00:00)  Pembrolizumab  Injection What is this medication? PEMBROLIZUMAB  (PEM broe LIZ ue mab) treats some types of cancer. It works by helping your immune system slow or stop the spread of cancer cells. It is a monoclonal antibody. This medicine may be used for other purposes; ask your health care provider or pharmacist if you have questions. COMMON BRAND NAME(S): Keytruda  What should I tell my care team before I take this medication? They need to know if you have any of these conditions: Allogeneic stem cell transplant (uses someone else's stem cells) Autoimmune diseases, such as Crohn disease, ulcerative colitis, lupus History of chest radiation Nervous system problems, such as Guillain-Barre syndrome, myasthenia gravis Organ transplant An unusual or allergic reaction to pembrolizumab , other medications, foods, dyes, or preservatives Pregnant or trying to get pregnant Breast-feeding How should I use this medication? This medication is injected into a vein. It is given by your care team in a hospital or clinic setting. A special MedGuide will be given to you before each treatment. Be sure to read this information carefully each time. Talk to your care team about the use of this medication in children. While it may be prescribed for children as young as 6 months for selected conditions,  precautions do apply. Overdosage: If you think you have taken too much of this medicine contact a poison control center or emergency room at once. NOTE: This medicine is only for you. Do not share this medicine with others. What if I miss a dose? Keep appointments for follow-up doses. It is important not to miss your dose. Call your care team if you are unable to keep an appointment. What may interact with this medication? Interactions have not been studied. This list may not describe all possible interactions. Give your health care provider a list of all the medicines, herbs, non-prescription drugs, or dietary supplements you use. Also tell them if  you smoke, drink alcohol, or use illegal drugs. Some items may interact with your medicine. What should I watch for while using this medication? Your condition will be monitored carefully while you are receiving this medication. You may need blood work while taking this medication. This medication may cause serious skin reactions. They can happen weeks to months after starting the medication. Contact your care team right away if you notice fevers or flu-like symptoms with a rash. The rash may be red or purple and then turn into blisters or peeling of the skin. You may also notice a red rash with swelling of the face, lips, or lymph nodes in your neck or under your arms. Tell your care team right away if you have any change in your eyesight. Talk to your care team if you may be pregnant. Serious birth defects can occur if you take this medication during pregnancy and for 4 months after the last dose. You will need a negative pregnancy test before starting this medication. Contraception is recommended while taking this medication and for 4 months after the last dose. Your care team can help you find the option that works for you. Do not breastfeed while taking this medication and for 4 months after the last dose. What side effects may I notice from receiving  this medication? Side effects that you should report to your care team as soon as possible: Allergic reactions--skin rash, itching, hives, swelling of the face, lips, tongue, or throat Dry cough, shortness of breath or trouble breathing Eye pain, redness, irritation, or discharge with blurry or decreased vision Heart muscle inflammation--unusual weakness or fatigue, shortness of breath, chest pain, fast or irregular heartbeat, dizziness, swelling of the ankles, feet, or hands Hormone gland problems--headache, sensitivity to light, unusual weakness or fatigue, dizziness, fast or irregular heartbeat, increased sensitivity to cold or heat, excessive sweating, constipation, hair loss, increased thirst or amount of urine, tremors or shaking, irritability Infusion reactions--chest pain, shortness of breath or trouble breathing, feeling faint or lightheaded Kidney injury (glomerulonephritis)--decrease in the amount of urine, red or dark brown urine, foamy or bubbly urine, swelling of the ankles, hands, or feet Liver injury--right upper belly pain, loss of appetite, nausea, light-colored stool, dark yellow or brown urine, yellowing skin or eyes, unusual weakness or fatigue Pain, tingling, or numbness in the hands or feet, muscle weakness, change in vision, confusion or trouble speaking, loss of balance or coordination, trouble walking, seizures Rash, fever, and swollen lymph nodes Redness, blistering, peeling, or loosening of the skin, including inside the mouth Sudden or severe stomach pain, bloody diarrhea, fever, nausea, vomiting Side effects that usually do not require medical attention (report to your care team if they continue or are bothersome): Bone, joint, or muscle pain Diarrhea Fatigue Loss of appetite Nausea Skin rash This list may not describe all possible side effects. Call your doctor for medical advice about side effects. You may report side effects to FDA at 1-800-FDA-1088. Where  should I keep my medication? This medication is given in a hospital or clinic. It will not be stored at home. NOTE: This sheet is a summary. It may not cover all possible information. If you have questions about this medicine, talk to your doctor, pharmacist, or health care provider.  2024 Elsevier/Gold Standard (2021-12-25 00:00:00)

## 2023-12-18 NOTE — Telephone Encounter (Signed)
 Pt's transportation issue discussed w/ Maudie Sorrow, SW today due to pt requiring ambulance transport to and from appointments. Pt is unable to transfer herself from bed to w/c or ambulate without considerable assistance, and her daughter states she cannot do this alone. Pt used [facility] services to get to her appts today. [facility] called at 1520 to arrange pt's ride home today after her infusion. Spoke w/ Dreama at [facility] and told her an approximate time for pickup will be 1730. Pasqual Bone, SW stated that she will be in touch with pt/daughter tomorrow to discuss transportation options.

## 2023-12-18 NOTE — Assessment & Plan Note (Addendum)
  Toxicities being monitored in relation to EV Monitor for skin toxicity Prophylactic Desitin twice daily to intertriginous, flexural, and acral areas Apply emollient moisturizer twice a day Use sunscreen if outside for prolonged period of time. Hydration 64+ ounces a day Avoid hot showers Monitor for neuropathy Monitor for hyperglycemia.  Baseline A1c Monitor for signs of pneumonitis Warning signs: Malaise.  Fever of more than 100.4.  Mucosal involvement in the ocular, oral or genital area.  Skin pain, burning, numbness tingling. Monitor for diarrhea and other GI symptoms  Monitor irAE.

## 2023-12-18 NOTE — Assessment & Plan Note (Signed)
 Start marinol 5 mg twice daily

## 2023-12-18 NOTE — Progress Notes (Signed)
 Pt transported home via stretcher by [facility].

## 2023-12-18 NOTE — Assessment & Plan Note (Addendum)
 History of known osteoporosis and fractures. Recommend BMA.

## 2023-12-18 NOTE — Assessment & Plan Note (Addendum)
 Worsening this week. Hold lasix  Continue adequate hydration

## 2023-12-22 ENCOUNTER — Ambulatory Visit: Attending: Cardiology

## 2023-12-22 ENCOUNTER — Telehealth: Payer: Self-pay | Admitting: Family Medicine

## 2023-12-22 DIAGNOSIS — G8220 Paraplegia, unspecified: Secondary | ICD-10-CM | POA: Diagnosis not present

## 2023-12-22 DIAGNOSIS — I420 Dilated cardiomyopathy: Secondary | ICD-10-CM | POA: Diagnosis not present

## 2023-12-22 DIAGNOSIS — M4726 Other spondylosis with radiculopathy, lumbar region: Secondary | ICD-10-CM | POA: Diagnosis not present

## 2023-12-22 DIAGNOSIS — I5022 Chronic systolic (congestive) heart failure: Secondary | ICD-10-CM

## 2023-12-22 DIAGNOSIS — Z95810 Presence of automatic (implantable) cardiac defibrillator: Secondary | ICD-10-CM

## 2023-12-22 DIAGNOSIS — E440 Moderate protein-calorie malnutrition: Secondary | ICD-10-CM | POA: Diagnosis not present

## 2023-12-22 DIAGNOSIS — N179 Acute kidney failure, unspecified: Secondary | ICD-10-CM | POA: Diagnosis not present

## 2023-12-22 DIAGNOSIS — I5042 Chronic combined systolic (congestive) and diastolic (congestive) heart failure: Secondary | ICD-10-CM | POA: Diagnosis not present

## 2023-12-22 DIAGNOSIS — J9611 Chronic respiratory failure with hypoxia: Secondary | ICD-10-CM | POA: Diagnosis not present

## 2023-12-22 DIAGNOSIS — C579 Malignant neoplasm of female genital organ, unspecified: Secondary | ICD-10-CM | POA: Diagnosis not present

## 2023-12-22 DIAGNOSIS — N1832 Chronic kidney disease, stage 3b: Secondary | ICD-10-CM | POA: Diagnosis not present

## 2023-12-22 DIAGNOSIS — J449 Chronic obstructive pulmonary disease, unspecified: Secondary | ICD-10-CM | POA: Diagnosis not present

## 2023-12-22 DIAGNOSIS — M069 Rheumatoid arthritis, unspecified: Secondary | ICD-10-CM | POA: Diagnosis not present

## 2023-12-22 DIAGNOSIS — F331 Major depressive disorder, recurrent, moderate: Secondary | ICD-10-CM

## 2023-12-22 NOTE — Telephone Encounter (Signed)
 [employer] Standard Written Order

## 2023-12-22 NOTE — Progress Notes (Signed)
 EPIC Encounter for ICM Monitoring  Patient Name: [name] is a 68 y.o. female Date: 12/22/2023 Primary Care Physican: Janece Means, FNP Primary Cardiologist: Gordan Latina Electrophysiologist: Lawana Pray 10/27/2023 Weight: 152 lbs (baseline 150 lbs) 11/04/2023 Weight: 150 lbs   Time in AT/AF <0.1 hr/day (<0.1%)                                                   Transmission results reviewed.  Pt has urothelial carcinoma with bone mets.   Diet: NA   Optivol thoracic impedance suggesting possible fluid accumulation starting 4/21 after Lasix  was discontinued.    Prescribed:  Furosemide  40 mg take 1 tablet(s) (40 mg total) by mouth every morning.  (Discontinued 12/18/2023 per OV note with Dr Alita Irwin due to worsening renal function)   Labs: 12/18/2023 Creatinine 2.40, BUN 50, Potassium 3.4, Sodium 136, GFR 21  12/15/2023 Creatinine 2.36, BUN 43, Potassium 3.4, Sodium 136, GFR 22  A complete set of results can be found in Results Review.   Recommendations:  Unable to assist with fluid level management at this time due to current condition.  91 day remote monitoring will continue.   Follow-up plan: No further ICM clinic phone appointments.   91 day device clinic remote transmission 03/03/2024.     EP/Cardiology Office Visits: Recall 10/14/2024 with Dr. Lawana Pray.         Copy of ICM check sent to Dr. Lawana Pray.     3 month ICM trend: 12/22/2023.    12-14 Month ICM trend:     Almyra Jain, RN 12/22/2023 4:49 PM

## 2023-12-23 ENCOUNTER — Telehealth: Payer: Self-pay | Admitting: Family Medicine

## 2023-12-23 ENCOUNTER — Other Ambulatory Visit: Payer: Self-pay | Admitting: Radiology

## 2023-12-23 DIAGNOSIS — I5042 Chronic combined systolic (congestive) and diastolic (congestive) heart failure: Secondary | ICD-10-CM | POA: Diagnosis not present

## 2023-12-23 DIAGNOSIS — N1832 Chronic kidney disease, stage 3b: Secondary | ICD-10-CM | POA: Diagnosis not present

## 2023-12-23 DIAGNOSIS — E785 Hyperlipidemia, unspecified: Secondary | ICD-10-CM | POA: Diagnosis not present

## 2023-12-23 DIAGNOSIS — D631 Anemia in chronic kidney disease: Secondary | ICD-10-CM | POA: Diagnosis not present

## 2023-12-23 DIAGNOSIS — G8220 Paraplegia, unspecified: Secondary | ICD-10-CM | POA: Diagnosis not present

## 2023-12-23 DIAGNOSIS — N179 Acute kidney failure, unspecified: Secondary | ICD-10-CM | POA: Diagnosis not present

## 2023-12-23 DIAGNOSIS — M069 Rheumatoid arthritis, unspecified: Secondary | ICD-10-CM | POA: Diagnosis not present

## 2023-12-23 DIAGNOSIS — J9611 Chronic respiratory failure with hypoxia: Secondary | ICD-10-CM | POA: Diagnosis not present

## 2023-12-23 DIAGNOSIS — M8008XD Age-related osteoporosis with current pathological fracture, vertebra(e), subsequent encounter for fracture with routine healing: Secondary | ICD-10-CM | POA: Diagnosis not present

## 2023-12-23 DIAGNOSIS — C679 Malignant neoplasm of bladder, unspecified: Secondary | ICD-10-CM

## 2023-12-23 DIAGNOSIS — E611 Iron deficiency: Secondary | ICD-10-CM | POA: Diagnosis not present

## 2023-12-23 DIAGNOSIS — D520 Dietary folate deficiency anemia: Secondary | ICD-10-CM | POA: Diagnosis not present

## 2023-12-23 DIAGNOSIS — N1330 Unspecified hydronephrosis: Secondary | ICD-10-CM | POA: Diagnosis not present

## 2023-12-23 DIAGNOSIS — J449 Chronic obstructive pulmonary disease, unspecified: Secondary | ICD-10-CM | POA: Diagnosis not present

## 2023-12-23 DIAGNOSIS — I420 Dilated cardiomyopathy: Secondary | ICD-10-CM | POA: Diagnosis not present

## 2023-12-23 DIAGNOSIS — D630 Anemia in neoplastic disease: Secondary | ICD-10-CM | POA: Diagnosis not present

## 2023-12-23 DIAGNOSIS — M47814 Spondylosis without myelopathy or radiculopathy, thoracic region: Secondary | ICD-10-CM | POA: Diagnosis not present

## 2023-12-23 DIAGNOSIS — G8929 Other chronic pain: Secondary | ICD-10-CM | POA: Diagnosis not present

## 2023-12-23 DIAGNOSIS — M4726 Other spondylosis with radiculopathy, lumbar region: Secondary | ICD-10-CM | POA: Diagnosis not present

## 2023-12-23 DIAGNOSIS — F5101 Primary insomnia: Secondary | ICD-10-CM | POA: Diagnosis not present

## 2023-12-23 DIAGNOSIS — M899 Disorder of bone, unspecified: Secondary | ICD-10-CM | POA: Diagnosis not present

## 2023-12-23 DIAGNOSIS — E440 Moderate protein-calorie malnutrition: Secondary | ICD-10-CM | POA: Diagnosis not present

## 2023-12-23 DIAGNOSIS — D72829 Elevated white blood cell count, unspecified: Secondary | ICD-10-CM | POA: Diagnosis not present

## 2023-12-23 NOTE — H&P (Signed)
 Chief Complaint: Chronic low back pain, metastatic bladder cancer/hydronephrosis; s/p right nephrostomy 11/16/23, s/p T5 biopsy 11/21/23 -persistent with metastatic urothelial carcinoma; referred for routine right percutaneous nephrostomy exchange and Port-A-Cath placement to assist with treatment.  Referring Provider(s): Winter,C/Chang,R  Supervising Physician: Marland Silvas  Patient Status: [facility] - Out-pt  History of Present Illness: [name] is a 68 y.o. female with past medical history significant for diverticulosis, arthritis, asthma, cardiomyopathy, CHF, COPD, coronary artery disease /prior MI, ICD in place, hyperlipidemia, GERD, depression, mesenteric ischemia, PTSD presenting now with recently diagnosed metastatic bladder cancer with distal ureteral obstruction.  She is status post right percutaneous nephrostomy on 11/16/2023 and T5 biopsy on 11/21/2023 revealing metastatic urothelial carcinoma.  She presents today for routine right nephrostomy exchange as well as Port-A-Cath placement to assist with treatment.  *** Patient is Full Code  Past Medical History:  Diagnosis Date   Abdominal pain 10/17/2020   Abnormality of gait due to impairment of balance 11/22/2020   Acute diverticulitis 10/17/2020   Admission for long-term opiate analgesic use 10/24/2019   Arthritis of right hip 05/29/2016   Formatting of this note might be different from the original. Added automatically from request for surgery [id number]   BMI 26.0-26.9,adult 03/29/2020   Bronchial asthma    Cardiomyopathy (HCC)    Overview:  Ejection fraction 45% in 2015 Ejection fraction 30 to 35% in November 2018   Chronic back pain    Chronic constipation 07/31/2021   Chronic hypoxemic respiratory failure (HCC) 10/24/2019   Chronic narcotic use 03/23/2015   Chronic pain of both knees 12/21/2018   Added automatically from request for surgery [id number]  Formatting of this note might be different from the original. 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   Hypotension 05/17/2020   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   Ileus following gastrointestinal surgery (HCC) 03/26/2015   Major depressive disorder, single episode, moderate (HCC) 10/24/2019   Malnutrition of moderate degree (HCC) 10/20/2020   Mesenteric ischemia (HCC) 10/16/2020   Migraine without aura with status migrainosus 10/24/2019   Mixed incontinence 10/20/2020   Myocardial infarction (HCC)    Nausea & vomiting    Obstructive chronic bronchitis with exacerbation (HCC) 10/25/2019   Other spondylosis with radiculopathy, lumbar region 10/24/2019   Persistent vomiting 05/17/2020   Presence of left artificial hip joint 10/24/2019   PTSD (post-traumatic stress disorder)    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 03/23/2015 04/19/2014   Senile osteoporosis 10/24/2019   Sepsis (HCC) 10/17/2020   Serosanguineous chronic otitis media of right ear 08/02/2021   Small bowel obstruction (HCC)    Thyroid  disease    Upper respiratory tract infection due to COVID-19 virus 07/06/2021   Wellness examination 03/27/2021    Past Surgical History:  Procedure Laterality  Date  APPENDECTOMY     CARDIAC CATHETERIZATION     CARPAL TUNNEL RELEASE     CESAREAN SECTION     CHF s/p AICD   12/2010   CHOLECYSTECTOMY     COLONOSCOPY  10/16/2015   Mild colonic diverticulosis, predominantly in the left colon. Small internal hemrrhoids. Otherwise normal colonoscopy   COLONOSCOPY WITH PROPOFOL  N/A 09/27/2021   Procedure: COLONOSCOPY WITH PROPOFOL ;  Surgeon: Sergio Dandy, MD;  Location: [facility];  Service: Endoscopy;  Laterality: N/A;   CORONARY ANGIOPLASTY     ESOPHAGOGASTRODUODENOSCOPY  02/04/2014   Mild gastritis. Otherwise normal EGD   HAND SURGERY Bilateral    ICD GENERATOR CHANGEOUT N/A 05/21/2019   Procedure: ICD GENERATOR CHANGEOUT;  Surgeon: Lei Pump, MD;  Location: [facility] CV [facility];  Service: Cardiovascular;  Laterality: N/A;   ICD IMPLANT     Medtronic   intestinal blockage 2011     IR NEPHROSTOMY PLACEMENT RIGHT  11/16/2023   LAPAROSCOPIC ASSISTED VENTRAL HERNIA REPAIR N/A 03/23/2015   Procedure: LAPAROSCOPIC VENTRAL WALL HERNIA REPAIR;  Surgeon: Candyce Champagne, MD;  Location: [facility] OR[facility];  Service: General;  Laterality: N/A;  With MESH   LAPAROSCOPIC LYSIS OF ADHESIONS N/A 03/23/2015   Procedure: LAPAROSCOPIC LYSIS OF ADHESIONS;  Surgeon: Candyce Champagne, MD;  Location: [facility];  Service: General;  Laterality: N/A;   LSCS      x2   NASAL SEPTUM SURGERY     NECK SURGERY     fused   TONSILLECTOMY     TRANSURETHRAL RESECTION OF BLADDER TUMOR N/A 11/16/2023   Procedure: TURBT (TRANSURETHRAL RESECTION OF BLADDER TUMOR);  Surgeon: Adelbert Homans, MD;  Location: [facility];  Service: Urology;  Laterality: N/A;   TYMPANOSTOMY TUBE PLACEMENT Right 2024   ULNAR NERVE TRANSPOSITION  01/23/2012   Procedure: ULNAR NERVE DECOMPRESSION/TRANSPOSITION;  Surgeon: Elder Greening, MD;  Location: [facility];  Service: Neurosurgery;  Laterality: Left;  LEFT ulnar nerve decompression    Allergies: Patient has no known  allergies.  Medications: Prior to Admission medications   Medication Sig Start Date End Date Taking? Authorizing Provider  ARIPiprazole  (ABILIFY ) 5 MG tablet TAKE 1 TABLET BY MOUTH DAILY 10/07/23   Cox, Kirsten, MD  aspirin  81 MG tablet Take 81 mg by mouth daily.    [provider]  BREO ELLIPTA  100-25 MCG/ACT AEPB INHALE 1 PUFF INTO THE LUNGS DAILY. Patient taking differently: Inhale 1 puff into the lungs daily as needed. 06/03/23   Odilia Bennett, PA  Carboxymethylcell-Glycerin  PF 0.5-0.9 % SOLN Place 2 drops into both eyes 4 (four) times daily. 12/01/23   Lowanda Ruddy, MD  carvedilol  (COREG ) 3.125 MG tablet TAKE ONE (1) TABLET BY MOUTH TWICE DAILY WITH MEALS 11/26/23   Janece Means, FNP  cyanocobalamin  1000 MCG tablet Take 1 tablet (1,000 mcg total) by mouth daily. Patient not taking: Reported on 12/18/2023 11/25/23   Gherghe, Costin M, MD  denosumab  (PROLIA ) 60 MG/ML SOSY injection Inject 60 mg into the skin every 6 (six) months. 02/14/20   Audie Bleacher, MD  dexamethasone  (DECADRON ) 6 MG tablet Take 1 tablet (6 mg total) by mouth daily. Patient not taking: Reported on 12/18/2023 11/24/23   Osborn Blaze, MD  dextromethorphan  15 MG/5ML syrup Take 10 mLs (30 mg total) by mouth 4 (four) times daily as needed for cough. Patient not taking: Reported on 11/14/2023 10/22/23   Janece Means, FNP  diclofenac  Sodium (VOLTAREN ) 1 % GEL Apply 2 g topically 4 (four)  times daily. Patient taking differently: Apply 2 g topically as needed (Pain). 05/01/21   Audie Bleacher, MD  dronabinol  (MARINOL ) 5 MG capsule Take 1 capsule (5 mg total) by mouth 2 (two) times daily before a meal. 12/18/23   Lowanda Ruddy, MD  ENTRESTO  24-26 MG TAKE ONE (1) TABLET BY MOUTH TWICE DAILY 11/26/23   Janece Means, FNP  EPINEPHRINE  0.3 mg/0.3 mL IJ SOAJ injection Inject 0.3 mLs (0.3 mg total) into the muscle as needed for anaphylaxis. 04/27/20   Audie Bleacher, MD  esomeprazole  (NEXIUM ) 20 MG capsule  TAKE ONE (1) CAPSULE BY MOUTH ONCE DAILY 08/28/23   Cox, Kirsten, MD  FARXIGA 5 MG TABS tablet Take 5 mg by mouth daily. 10/10/23   [provider]  ferrous sulfate  325 (65 FE) MG tablet Take 1 tablet (325 mg total) by mouth at bedtime. 11/24/23   Gherghe, Costin M, MD  fluticasone  (FLONASE ) 50 MCG/ACT nasal spray USE 2 SPRAYS IN EACH NOSTRILS DAILY AS NEEDED Patient taking differently: Place 2 sprays into both nostrils daily as needed for allergies or rhinitis. 07/18/23   Janece Means, FNP  folic acid  (FOLVITE ) 1 MG tablet Take 1 tablet (1 mg total) by mouth daily. 11/25/23   Gherghe, Costin M, MD  ipratropium-albuterol  (DUONEB) 0.5-2.5 (3) MG/3ML SOLN USE 1 VIAL VIA NEBULIZER EVERY 6 HOURS AS NEEDED FOR SHORTNESS OF BREATH 10/16/23   Janece Means, FNP  levocetirizine (XYZAL) 5 MG tablet Take 5 mg by mouth daily as needed for allergies. 12/13/19   [provider]  lidocaine  (LIDODERM ) 5 % Place 1 patch onto the skin daily. Remove & Discard patch within 12 hours or as directed by MD 11/24/23   Osborn Blaze, MD  lidocaine -prilocaine  (EMLA ) cream Apply to affected area once 12/01/23   Lowanda Ruddy, MD  lubiprostone  (AMITIZA ) 24 MCG capsule TAKE 1 CAPSULE(24 MCG) BY MOUTH TWICE DAILY WITH A MEAL 09/15/23   Cox, Kirsten, MD  Multiple Vitamin (MULTIVITAMIN WITH MINERALS) TABS tablet Take 1 tablet by mouth daily. 10/30/20   Armenta Landau, MD  naloxone  (NARCAN ) nasal spray 4 mg/0.1 mL Place 1 spray into the nose once. 01/12/21   [provider]  nitroGLYCERIN  (NITROSTAT ) 0.4 MG SL tablet Place 1 tablet (0.4 mg total) under the tongue every 5 (five) minutes as needed for chest pain. 11/27/21   Krasowski, Robert J, MD  ondansetron  (ZOFRAN ) 4 MG tablet Take 1 tablet (4 mg total) by mouth every 8 (eight) hours as needed for nausea or vomiting. Patient not taking: Reported on 12/18/2023 05/17/20   Audie Bleacher, MD  ondansetron  (ZOFRAN ) 8 MG tablet Take 1 tablet (8 mg total) by  mouth every 8 (eight) hours as needed for nausea or vomiting. Patient not taking: Reported on 12/18/2023 12/01/23   Lowanda Ruddy, MD  oxyCODONE  (ROXICODONE ) 15 MG immediate release tablet Take 1 tablet (15 mg total) by mouth every 8 (eight) hours as needed for pain. 12/16/23   Lowanda Ruddy, MD  prochlorperazine  (COMPAZINE ) 10 MG tablet TAKE 1 TABLET(10 MG) BY MOUTH EVERY 6 HOURS AS NEEDED FOR NAUSEA OR VOMITING Patient not taking: Reported on 12/18/2023 12/15/23   Lowanda Ruddy, MD  ranolazine  (RANEXA ) 500 MG 12 hr tablet TAKE 1 TABLET BY MOUTH TWICE DAILY 10/28/23   Krasowski, Robert J, MD  rosuvastatin  (CRESTOR ) 20 MG tablet TAKE 1 TABLET BY MOUTH ONCE DAILY 08/28/23   Mercy Stall, MD  sertraline  (ZOLOFT ) 100 MG  tablet TAKE 1/2 TABLET(50 MG) BY MOUTH DAILY 08/24/23   Cox, Kirsten, MD  tiZANidine (ZANAFLEX) 4 MG tablet Take 4 mg by mouth at bedtime. 11/09/20   [provider]  topiramate  (TOPAMAX ) 50 MG tablet TAKE TWO (2) TABLETS BY MOUTH EVERY DAY AT BEDTIME 11/26/23   Janece Means, FNP  Ubrogepant  (UBRELVY ) 100 MG TABS Take 1 tablet (100 mg total) by mouth as needed (May repeat after 2 hours.  Maximum 2 tablets in 24 hours). TAKE 1 TABLET BY MOUTH BY MOUTH AS NEEDED( MAY REPEAT 1 TABLET AFTER 2 HOURS IF NEEDED, MAXIMUM 2 TABLETS IN 24 HOURS) Strength: 100 mg 04/07/23   Cox, Kirsten, MD  Vitamin D , Ergocalciferol , (DRISDOL ) 1.25 MG (50000 UNIT) CAPS capsule TAKE 1 CAPSULE BY MOUTH EVERY 7 DAYS FOR 12 DOSES Patient taking differently: Take 50,000 Units by mouth every 7 (seven) days. Patient usually takes this medication on mondays 06/30/23   CoxBurleigh Carp, MD     Family History  Problem Relation Age of Onset   Hypertension Mother    Heart attack Mother    Alcohol abuse Father    Hypertension Father    Heart attack Father    Heart attack Other    Cancer Other    Heart failure Other    Anesthesia problems Neg Hx    Hypotension Neg Hx    Malignant hyperthermia Neg Hx    Pseudochol  deficiency Neg Hx    Breast cancer Neg Hx     Social History   Socioeconomic History   Marital status: Single    Spouse name: Not on file   Number of children: 3   Years of education: Not on file   Highest education level: 10th grade  Occupational History   Occupation: disabled  Tobacco Use   Smoking status: Former    Current packs/day: 0.00    Average packs/day: 0.5 packs/day for 30.0 years (15.0 ttl pk-yrs)    Types: Cigarettes    Start date: 03/1990    Quit date: 03/2020    Years since quitting: 3.7   Smokeless tobacco: Never  Vaping Use   Vaping status: Never Used  Substance and Sexual Activity   Alcohol use: Yes    Alcohol/week: 2.0 standard drinks of alcohol    Types: 1 Cans of beer, 1 Standard drinks or equivalent per week    Comment: seldom   Drug use: No   Sexual activity: Not Currently  Other Topics Concern   Not on file  Social History Narrative   One level home with boyfriend   Caffeine - coffee 1-2 cups/day; Green tea 4-5 bottles a day   Exercise - some    Right handed      Social Drivers of Health   Financial Resource Strain: Low Risk  (04/05/2023)   Overall Financial Resource Strain (CARDIA)    Difficulty of Paying Living Expenses: Not very hard  Food Insecurity: No Food Insecurity (11/15/2023)   Hunger Vital Sign    Worried About Running Out of Food in the Last Year: Never true    Ran Out of Food in the Last Year: Never true  Transportation Needs: Unmet Transportation Needs (11/15/2023)   PRAPARE - Administrator, Civil Service (Medical): Yes    Lack of Transportation (Non-Medical): No  Physical Activity: Insufficiently Active (04/05/2023)   Exercise Vital Sign    Days of Exercise per Week: 3 days    Minutes of Exercise per Session: 20 min  Stress: Stress Concern Present (04/05/2023)   [employer] of Occupational Health - Occupational Stress Questionnaire    Feeling of Stress : Very much  Social Connections: Moderately Isolated  (11/15/2023)   Social Connection and Isolation Panel [NHANES]    Frequency of Communication with Friends and Family: More than three times a week    Frequency of Social Gatherings with Friends and Family: More than three times a week    Attends Religious Services: 1 to 4 times per year    Active Member of [employer] or Organizations: No    Attends Banker Meetings: Never    Marital Status: Divorced       Review of Systems  Vital Signs:   Advance Care Plan: No documents on file   Physical Exam  Imaging: NM PET Image Restag (PS) Skull Base To Thigh Result Date: 12/12/2023 CLINICAL DATA:  Subsequent treatment strategy for bladder cancer. Staging. Transurethral resection 11/16/2023. EXAM: NUCLEAR MEDICINE PET SKULL BASE TO THIGH TECHNIQUE: 7.2 mCi F-18 FDG was injected intravenously. Full-ring PET imaging was performed from the skull base to thigh after the radiotracer. CT data was obtained and used for attenuation correction and anatomic localization. Fasting blood glucose: 101 mg/dl COMPARISON:  Pelvic CT of 11/17/2023. Bone scan of 11/19/2023. Chest CT 10/17/2023. Abdominopelvic CT 10/25/2020 FINDINGS: Mediastinal blood pool activity: SUV max 3.1 Liver activity: SUV max NA NECK: Muscular activity within the neck. Given this mild limitation, no cervical nodal hypermetabolism. Incidental CT findings: No cervical adenopathy. CHEST: No pulmonary parenchymal or thoracic nodal hypermetabolism. Incidental CT findings: Pacer. Aortic atherosclerosis. Moderate cardiomegaly. ABDOMEN/PELVIS: No abdominopelvic nodal hypermetabolism. Although the pelvis is poorly evaluated secondary to beam hardening artifact from right hip arthroplasty, there is hypermetabolism which corresponds to a central pelvic soft tissue density. Example at a S.U.V. max of 14.9, including on 140/4. This most likely represents the uterus. Incidental CT findings: Cholecystectomy. Normal adrenal glands. Right nephrostomy tube.  Right renal cortical thinning. Interpolar right renal 1.2 cm low-density lesion is likely a cyst . In the absence of clinically indicated signs/symptoms require(s) no independent follow-up. Right renal cortical thinning with probable moderate right hydronephrosis but no hydroureter. Mild to moderate left hydroureteronephrosis, followed to the bladder. Distal ureter poorly visualized. Foley catheter decompresses the urinary bladder. Abdominal aortic atherosclerosis. SKELETON: Multifocal hypermetabolic osseous metastasis. Index lytic lesion within the left pubic bone of 1.4 cm and a S.U.V. max of 9.6 on 150/4. A left-sided vertebral body lesion with extension into the pedicle and posterior elements is favored to be at T2, including at a S.U.V. max of 8.0 on 23/4. Bilateral hypermetabolic foci within ribs could be posttraumatic or also represent metastasis. Bilateral sacral hypermetabolism corresponds to insufficiency fractures including on 122/4. Incidental CT findings: Right hip arthroplasty. Cervical spine fixation. Lower lateral right rib nonacute fractures. IMPRESSION: 1. Multifocal osseous metastasis as detailed above. 2. Although evaluation of the pelvis is degraded secondary to beam hardening artifact from right hip arthroplasty, hypermetabolic soft tissue density within the central pelvis is seen. This likely represents uterine hypermetabolism and a synchronous uterine primary would be a concern. If the patient has had a prior hysterectomy, extracystic extension of tumor would be favored. If the patient has not had hysterectomy, consider pelvic ultrasound. 3. Otherwise, no evidence of extraosseous metastatic disease. 4. Left-sided hydroureteronephrosis followed to the level of the lower pelvis. No obstructive stone seen. Presumably related to the bladder primary. 5. Incidental findings, including: Aortic Atherosclerosis (ICD10-I70.0). Possible constipation. Sacral insufficiency fractures bilaterally.  Electronically Signed   By: Lore Rode M.D.   On: 12/12/2023 18:33   CUP PACEART REMOTE DEVICE CHECK Result Date: 12/10/2023 Scheduled remote reviewed. Normal device function.  Presenting rhythm:  AS/VS HF diagnostics currenlty abnormal, followed by ICM Next remote 91 days. LA, CVRS   Labs:  CBC: Recent Labs    11/23/23 0353 11/25/23 0650 12/15/23 1526 12/18/23 1302  WBC 14.7* 16.8* 9.5 10.2  HGB 10.1* 11.2* 10.6* 10.1*  HCT 31.9* 34.8* 32.2* 30.5*  PLT 248 267 303 302    COAGS: Recent Labs    11/15/23 0320  INR 1.1  APTT 26    BMP: Recent Labs    11/23/23 0353 11/25/23 0650 12/15/23 1526 12/18/23 1302  NA 138 139 136 136  K 4.1 3.3* 3.4* 3.4*  CL 105 100 97* 99  CO2 25 27 25 27   GLUCOSE 121* 86 93 99  BUN 21 22 43* 50*  CALCIUM  8.4* 8.8* 9.9 9.4  CREATININE 1.38* 1.52* 2.36* 2.40*  GFRNONAA 41* 37* 22* 21*    LIVER FUNCTION TESTS: Recent Labs    11/14/23 1649 11/21/23 0341 11/23/23 0353 12/15/23 1526  BILITOT 0.5 0.3 0.4 0.5  AST 24 14* 18 13*  ALT 15 10 12 7   ALKPHOS 72 81 90 102  PROT 7.6 5.2* 4.8* 6.9  ALBUMIN  3.6 2.5* 2.5* 3.7    TUMOR MARKERS: No results for input(s): "AFPTM", "CEA", "CA199", "CHROMGRNA" in the last 8760 hours.  Assessment and Plan: 68 y.o. female with past medical history significant for diverticulosis, arthritis, asthma, cardiomyopathy, CHF, COPD, coronary artery disease /prior MI/angioplasty, ICD in place, hyperlipidemia, GERD, depression, mesenteric ischemia, PTSD presenting now with recently diagnosed metastatic bladder cancer with distal ureteral obstruction.  She is status post right percutaneous nephrostomy on 11/16/2023 and T5 biopsy on 11/21/2023 revealing metastatic urothelial carcinoma.  She presents today for routine right nephrostomy exchange as well as Port-A-Cath placement to assist with treatment.Risks and benefits of image guided port-a-catheter placement/nephrostomy exchange was discussed with the patient  including, but not limited to bleeding, infection, pneumothorax, or fibrin sheath development and need for additional procedures.  All of the patient's questions were answered, patient is agreeable to proceed. Consent signed and in chart.    Thank you for allowing our service to participate in [name] 's care.  Electronically Signed: D. Honore Lux, PA-C   12/23/2023, 4:19 PM      I spent a total of    25 Minutes in face to face in clinical consultation, greater than 50% of which was counseling/coordinating care for image guided Port-A-Cath placement/right nephrostomy exchange

## 2023-12-23 NOTE — Telephone Encounter (Signed)
 [facility] Coordination Report 12/23/23.

## 2023-12-23 NOTE — Telephone Encounter (Signed)
 [facility] [phone]

## 2023-12-24 ENCOUNTER — Telehealth: Payer: Self-pay

## 2023-12-24 ENCOUNTER — Other Ambulatory Visit: Payer: Self-pay

## 2023-12-24 ENCOUNTER — Other Ambulatory Visit (HOSPITAL_COMMUNITY): Payer: Self-pay | Admitting: Radiology

## 2023-12-24 ENCOUNTER — Ambulatory Visit (HOSPITAL_COMMUNITY)
Admission: RE | Admit: 2023-12-24 | Discharge: 2023-12-24 | Disposition: A | Discharge status: Home / Self Care | Source: Ambulatory Visit

## 2023-12-24 ENCOUNTER — Other Ambulatory Visit: Payer: Self-pay | Admitting: Family Medicine

## 2023-12-24 ENCOUNTER — Encounter (HOSPITAL_COMMUNITY): Payer: Self-pay

## 2023-12-24 DIAGNOSIS — N135 Crossing vessel and stricture of ureter without hydronephrosis: Secondary | ICD-10-CM | POA: Diagnosis not present

## 2023-12-24 DIAGNOSIS — E782 Mixed hyperlipidemia: Secondary | ICD-10-CM

## 2023-12-24 DIAGNOSIS — M545 Low back pain, unspecified: Secondary | ICD-10-CM | POA: Insufficient documentation

## 2023-12-24 DIAGNOSIS — J4489 Other specified chronic obstructive pulmonary disease: Secondary | ICD-10-CM | POA: Diagnosis not present

## 2023-12-24 DIAGNOSIS — Z87891 Personal history of nicotine dependence: Secondary | ICD-10-CM | POA: Insufficient documentation

## 2023-12-24 DIAGNOSIS — E785 Hyperlipidemia, unspecified: Secondary | ICD-10-CM | POA: Insufficient documentation

## 2023-12-24 DIAGNOSIS — C679 Malignant neoplasm of bladder, unspecified: Secondary | ICD-10-CM | POA: Diagnosis not present

## 2023-12-24 DIAGNOSIS — I2510 Atherosclerotic heart disease of native coronary artery without angina pectoris: Secondary | ICD-10-CM | POA: Diagnosis not present

## 2023-12-24 DIAGNOSIS — K219 Gastro-esophageal reflux disease without esophagitis: Secondary | ICD-10-CM | POA: Insufficient documentation

## 2023-12-24 DIAGNOSIS — N1330 Unspecified hydronephrosis: Secondary | ICD-10-CM | POA: Diagnosis not present

## 2023-12-24 DIAGNOSIS — G8929 Other chronic pain: Secondary | ICD-10-CM | POA: Diagnosis not present

## 2023-12-24 DIAGNOSIS — I252 Old myocardial infarction: Secondary | ICD-10-CM | POA: Insufficient documentation

## 2023-12-24 DIAGNOSIS — I429 Cardiomyopathy, unspecified: Secondary | ICD-10-CM | POA: Diagnosis not present

## 2023-12-24 DIAGNOSIS — F4310 Post-traumatic stress disorder, unspecified: Secondary | ICD-10-CM | POA: Insufficient documentation

## 2023-12-24 DIAGNOSIS — Z95810 Presence of automatic (implantable) cardiac defibrillator: Secondary | ICD-10-CM | POA: Insufficient documentation

## 2023-12-24 DIAGNOSIS — N179 Acute kidney failure, unspecified: Secondary | ICD-10-CM

## 2023-12-24 HISTORY — PX: IR URETERAL STENT PLACEMENT EXISTING ACCESS RIGHT: IMG6074

## 2023-12-24 HISTORY — PX: IR IMAGING GUIDED PORT INSERTION: IMG5740

## 2023-12-24 HISTORY — PX: IR NEPHROSTOMY EXCHANGE RIGHT: IMG6070

## 2023-12-24 HISTORY — PX: IR URETERAL STENT RIGHT NEW ACCESS W/O SEP NEPHROSTOMY CATH: IMG6076

## 2023-12-24 LAB — CBC
HCT: 31.3 % — ABNORMAL LOW (ref 36.0–46.0)
Hemoglobin: 10 g/dL — ABNORMAL LOW (ref 12.0–15.0)
MCH: 29.7 pg (ref 26.0–34.0)
MCHC: 31.9 g/dL (ref 30.0–36.0)
MCV: 92.9 fL (ref 80.0–100.0)
Platelets: 261 10*3/uL (ref 150–400)
RBC: 3.37 MIL/uL — ABNORMAL LOW (ref 3.87–5.11)
RDW: 13.2 % (ref 11.5–15.5)
WBC: 9.4 10*3/uL (ref 4.0–10.5)
nRBC: 0 % (ref 0.0–0.2)

## 2023-12-24 LAB — PROTIME-INR
INR: 1.1 (ref 0.8–1.2)
Prothrombin Time: 14.2 s (ref 11.4–15.2)

## 2023-12-24 MED ORDER — MIDAZOLAM HCL 2 MG/2ML IJ SOLN
INTRAMUSCULAR | Status: AC | PRN
  Administered 2023-12-24 (×2): 1 mg via INTRAVENOUS

## 2023-12-24 MED ORDER — HEPARIN SOD (PORK) LOCK FLUSH 100 UNIT/ML IV SOLN
500.0000 [IU] | Freq: Once | INTRAVENOUS | Status: AC
  Administered 2023-12-24: 500 [IU] via INTRAVENOUS

## 2023-12-24 MED ORDER — HEPARIN SOD (PORK) LOCK FLUSH 100 UNIT/ML IV SOLN
INTRAVENOUS | Status: AC
  Filled 2023-12-24: qty 5

## 2023-12-24 MED ORDER — FENTANYL CITRATE (PF) 100 MCG/2ML IJ SOLN
INTRAMUSCULAR | Status: AC | PRN
  Administered 2023-12-24 (×2): 50 ug via INTRAVENOUS

## 2023-12-24 MED ORDER — FENTANYL CITRATE (PF) 100 MCG/2ML IJ SOLN
INTRAMUSCULAR | Status: AC
  Filled 2023-12-24: qty 2

## 2023-12-24 MED ORDER — SODIUM CHLORIDE 0.9% FLUSH: 5.0000 mL | Freq: Three times a day (TID) | INTRAVENOUS | Status: DC

## 2023-12-24 MED ORDER — LIDOCAINE-EPINEPHRINE 1 %-1:100000 IJ SOLN
INTRAMUSCULAR | Status: AC
  Filled 2023-12-24: qty 1

## 2023-12-24 MED ORDER — LIDOCAINE HCL 1 % IJ SOLN
20.0000 mL | Freq: Once | INTRAMUSCULAR | Status: AC
  Administered 2023-12-24: 10 mL via INTRADERMAL

## 2023-12-24 MED ORDER — LIDOCAINE HCL 1 % IJ SOLN
INTRAMUSCULAR | Status: AC
  Filled 2023-12-24: qty 20

## 2023-12-24 MED ORDER — SODIUM CHLORIDE 0.9 % IV SOLN: INTRAVENOUS | Status: DC

## 2023-12-24 MED ORDER — IOHEXOL 300 MG/ML  SOLN
50.0000 mL | Freq: Once | INTRAMUSCULAR | Status: AC | PRN
  Administered 2023-12-24: 20 mL

## 2023-12-24 MED ORDER — LIDOCAINE-EPINEPHRINE 1 %-1:100000 IJ SOLN
20.0000 mL | Freq: Once | INTRAMUSCULAR | Status: AC
  Administered 2023-12-24: 20 mL via INTRADERMAL

## 2023-12-24 MED ORDER — HYDROCODONE-ACETAMINOPHEN 5-325 MG PO TABS: 1.0000 | ORAL_TABLET | ORAL | Status: DC | PRN

## 2023-12-24 MED ORDER — MIDAZOLAM HCL 2 MG/2ML IJ SOLN
INTRAMUSCULAR | Status: AC
  Filled 2023-12-24: qty 2

## 2023-12-24 NOTE — Procedures (Signed)
  Procedure:  1. Port catheter placement 2. Right JJ stent placement 22cm ; 46F perc neph tube left in place, capped, to be removed next week if stent tolerated Preprocedure diagnosis: Diagnoses of Urothelial carcinoma of bladder (HCC) and AKI (acute kidney injury) (HCC) were pertinent to this visit. Postprocedure diagnosis: same EBL:    minimal Complications:   none immediate  See full dictation in [employer].  Nicky Barrack MD Main # [phone] Pager  [phone] Mobile [phone]

## 2023-12-24 NOTE — Telephone Encounter (Signed)
 After being informed by infusion RN that pt's infusion will be delayed tomorrow if she needs a bed, TC to pt's daughter to discuss options. She said pt could probably tolerate being in the w/c longer in order to wait for a bed, but she thinks if it's going to be a significant delay for a bed, it would be best to put her in a reclining chair and get started on time. She said pt would be willing to try a chair tomorrow since they recline well. Notified N. Wiley, Consulting civil engineer in infusion.

## 2023-12-24 NOTE — Discharge Instructions (Addendum)
 Please call Interventional Radiology clinic [phone] with any questions or concerns.  12-31-23 Wednesday Interventional Radiology for tube removal at 2 pm to see the doctor at 2:30 pm.   You may remove your dressing and shower tomorrow.  After the procedure, it is common to have: Some soreness where the JJ stent  was inserted (tube insertion site) May attach the merit drainage depot bag if she has drainage onto the dressing or pain  Follow these instructions at home:  Medication: Do not use Aspirin  or ibuprofen  products, such as Advil  or Motrin , as it may increase bleeding.  You may resume your usual medications as ordered by your doctor If your doctor prescribed antibiotics, take them as directed. Do not stop taking them just because you feel better. You need to take the full course of antibiotics.  Eating and drinking: Drink plenty of liquids to keep your urine pale yellow You can resume your regular diet as directed by your doctor  Care of the procedure site Follow instructions from your health care provider about how to take care of your tube insertion site. Make sure you: Wash your hands with soap and water  for at least 20 seconds before you change your dressing. If soap and water  are not available, use hand sanitizer. Change your dressing as told by your health care provider. Be careful not to pull on the tube while removing the dressing When you change the dressing, wash the skin around the tube, rinse well, and pat the skin dry Avoid using scissors to remove the dressing. Sharp objects may damage the catheter Check the tube insertion area every day for signs of infection. Check for: Redness, swelling, or pain Fluid or blood Warmth Pus or a bad smell Care of the nephrostomy tube and drainage bag Always keep the tubing, the leg bag, or the bedside drainage bags below the level of the kidney so that your urine drains freely When connecting your nephrostomy tube to a drainage  bag, make sure that there are no kinks in the tubing and that your urine is draining freely. You may want to use an elastic bandage to wrap any exposed tubing that goes from the nephrostomy tube to any of the connecting tubes. At night, you may want to connect your nephrostomy tube or the leg bag to a larger bedside drainage bag Follow instructions from your health care provider about how to empty or change the drainage bag Empty the drainage bag when it becomes ? full Replace the drainage bag and any extension tubing that is connected to your nephrostomy tube every 7 days or as told by your health care provider. Your health care provider will explain how to change the drainage bag and extension tubing.  Activity Do not lift anything that is heavier than 10 lb (4.5 kg), or the limit that you are told, until your health care provider says that it is safe Return to your normal activities as told by your health care provider.  Avoid activities that may cause the JJ stent  tubing to bend Do not take baths, swim, or use a hot tub until your health care provider approves. Ask your health care provider if you can take showers.  Cover the JJ stent bandage (dressing) with a watertight covering when you take a shower Keep all follow-up visits as told by your doctor  Contact a health care provider if: You have problems with any of the valves or tubing You have persistent pain or soreness in your  back You have redness, swelling, or pain around your tube insertion site You have fluid or blood coming from your tube insertion site Your tube insertion site feels warm to the touch You have pus or a bad smell coming from your tube insertion site You have increased urine output or you feel burning when urinating  Get help right away if: You have pain in your abdomen during the first week You have chest pain or have trouble breathing You have a new appearance of blood in your urine You have a fever or  chills You have back pain that is not relieved by your medicine You have decreased urine output Your nephrostomy tube comes out    Implanted Port Insertion, Care After  The following information offers guidance on how to care for yourself after your procedure. Your health care provider may also give you more specific instructions. If you have problems or questions, contact your health care provider. What can I expect after the procedure? After the procedure, it is common to have: Discomfort at the port insertion site. Bruising on the skin over the port. This should improve over 3-4 days. Follow these instructions at home: [facility] care After your port is placed, you will get a manufacturer's information card. The card has information about your port. Keep this card with you at all times. Take care of the port as told by your health care provider. Ask your health care provider if you or a family member can get training for taking care of the port at home. A home health care nurse will be be available to help care for the port. Make sure to remember what type of port you have. Incision care     Follow instructions from your health care provider about how to take care of your port insertion site. Make sure you: Wash your hands with soap and water  for at least 20 seconds before and after you change your bandage (dressing). If soap and water  are not available, use hand sanitizer. Change your dressing as told by your health care provider. Leave stitches (sutures), skin glue, or adhesive strips in place. These skin closures may need to stay in place for 2 weeks or longer. If adhesive strip edges start to loosen and curl up, you may trim the loose edges. Do not remove adhesive strips completely unless your health care provider tells you to do that. Check your port insertion site every day for signs of infection. Check for: Redness, swelling, or pain. Fluid or blood. Warmth. Pus or a bad  smell. Activity Return to your normal activities as told by your health care provider. Ask your health care provider what activities are safe for you. You may have to avoid lifting. Ask your health care provider how much you can safely lift. General instructions Take over-the-counter and prescription medicines only as told by your health care provider. Do not take baths, swim, or use a hot tub until your health care provider approves. Ask your health care provider if you may take showers. You may only be allowed to take sponge baths. If you were given a sedative during the procedure, it can affect you for several hours. Do not drive or operate machinery until your health care provider says that it is safe. Wear a medical alert bracelet in case of an emergency. This will tell any health care providers that you have a port. Keep all follow-up visits. This is important. Contact a health care provider if: You cannot flush  your port with saline as directed, or you cannot draw blood from the port. You have a fever or chills. You have redness, swelling, or pain around your port insertion site. You have fluid or blood coming from your port insertion site. Your port insertion site feels warm to the touch. You have pus or a bad smell coming from the port insertion site. Get help right away if: You have chest pain or shortness of breath. You have bleeding from your port that you cannot control. These symptoms may be an emergency. Get help right away. Call 911. Do not wait to see if the symptoms will go away. Do not drive yourself to the hospital. Summary Take care of the port as told by your health care provider. Keep the manufacturer's information card with you at all times. Change your dressing as told by your health care provider. Contact a health care provider if you have a fever or chills or if you have redness, swelling, or pain around your port insertion site. Keep all follow-up visits. This  information is not intended to replace advice given to you by your health care provider. Make sure you discuss any questions you have with your health care provider. Document Revised: 02/13/2021 Document Reviewed: 02/13/2021 Elsevier Patient Education  2023 [employer]     Moderate Conscious Sedation, Adult, Care After   This sheet gives you information about how to care for yourself after your procedure. Your health care provider may also give you more specific instructions. If you have problems or questions, contact your health care provider. What can I expect after the procedure? After the procedure, it is common to have: Sleepiness for several hours. Impaired judgment for several hours. Difficulty with balance. Vomiting if you eat too soon. Follow these instructions at home: For the time period you were told by your health care provider: Rest. Do not participate in activities where you could fall or become injured. Do not drive or use machinery. Do not drink alcohol. Do not take sleeping pills or medicines that cause drowsiness. Do not make important decisions or sign legal documents. Do not take care of children on your own. Eating and drinking  Follow the diet recommended by your health care provider. Drink enough fluid to keep your urine pale yellow. If you vomit: Drink water , juice, or soup when you can drink without vomiting. Make sure you have little or no nausea before eating solid foods. General instructions Take over-the-counter and prescription medicines only as told by your health care provider. Have a responsible adult stay with you for the time you are told. It is important to have someone help care for you until you are awake and alert. Do not smoke. Keep all follow-up visits as told by your health care provider. This is important. Contact a health care provider if: You are still sleepy or having trouble with balance after 24 hours. You feel  light-headed. You keep feeling nauseous or you keep vomiting. You develop a rash. You have a fever. You have redness or swelling around the IV site. Get help right away if: You have trouble breathing. You have new-onset confusion at home. Summary After the procedure, it is common to feel sleepy, have impaired judgment, or feel nauseous if you eat too soon. Rest after you get home. Know the things you should not do after the procedure. Follow the diet recommended by your health care provider and drink enough fluid to keep your urine pale yellow. Get help right  away if you have trouble breathing or new-onset confusion at home. This information is not intended to replace advice given to you by your health care provider. Make sure you discuss any questions you have with your health care provider. Document Revised: 12/10/2019 Document Reviewed: 07/08/2019 Elsevier Patient Education  2023 [employer].

## 2023-12-24 NOTE — Sedation Documentation (Signed)
Port completed 

## 2023-12-25 ENCOUNTER — Telehealth: Payer: Self-pay

## 2023-12-25 ENCOUNTER — Ambulatory Visit: Payer: 59 | Admitting: Family Medicine

## 2023-12-25 ENCOUNTER — Inpatient Hospital Stay

## 2023-12-25 ENCOUNTER — Ambulatory Visit

## 2023-12-25 ENCOUNTER — Encounter (HOSPITAL_COMMUNITY): Payer: Self-pay | Admitting: Radiology

## 2023-12-25 ENCOUNTER — Inpatient Hospital Stay (HOSPITAL_BASED_OUTPATIENT_CLINIC_OR_DEPARTMENT_OTHER): Admitting: Nurse Practitioner

## 2023-12-25 ENCOUNTER — Inpatient Hospital Stay: Admitting: Nutrition

## 2023-12-25 ENCOUNTER — Other Ambulatory Visit: Payer: Self-pay

## 2023-12-25 VITALS — BP 106/70 | HR 98 | Temp 97.6°F | Resp 17 | Wt 142.2 lb

## 2023-12-25 DIAGNOSIS — G893 Neoplasm related pain (acute) (chronic): Secondary | ICD-10-CM | POA: Diagnosis not present

## 2023-12-25 DIAGNOSIS — J309 Allergic rhinitis, unspecified: Secondary | ICD-10-CM | POA: Insufficient documentation

## 2023-12-25 DIAGNOSIS — Z515 Encounter for palliative care: Secondary | ICD-10-CM | POA: Diagnosis not present

## 2023-12-25 DIAGNOSIS — G8929 Other chronic pain: Secondary | ICD-10-CM | POA: Diagnosis not present

## 2023-12-25 DIAGNOSIS — R630 Anorexia: Secondary | ICD-10-CM | POA: Diagnosis not present

## 2023-12-25 DIAGNOSIS — R5383 Other fatigue: Secondary | ICD-10-CM | POA: Diagnosis not present

## 2023-12-25 DIAGNOSIS — K5900 Constipation, unspecified: Secondary | ICD-10-CM | POA: Diagnosis not present

## 2023-12-25 DIAGNOSIS — Z9189 Other specified personal risk factors, not elsewhere classified: Secondary | ICD-10-CM | POA: Diagnosis not present

## 2023-12-25 DIAGNOSIS — Z87891 Personal history of nicotine dependence: Secondary | ICD-10-CM | POA: Insufficient documentation

## 2023-12-25 DIAGNOSIS — R197 Diarrhea, unspecified: Secondary | ICD-10-CM | POA: Diagnosis not present

## 2023-12-25 DIAGNOSIS — N1831 Chronic kidney disease, stage 3a: Secondary | ICD-10-CM

## 2023-12-25 DIAGNOSIS — R112 Nausea with vomiting, unspecified: Secondary | ICD-10-CM | POA: Diagnosis not present

## 2023-12-25 DIAGNOSIS — C679 Malignant neoplasm of bladder, unspecified: Secondary | ICD-10-CM

## 2023-12-25 DIAGNOSIS — K219 Gastro-esophageal reflux disease without esophagitis: Secondary | ICD-10-CM | POA: Diagnosis not present

## 2023-12-25 DIAGNOSIS — Z79620 Long term (current) use of immunosuppressive biologic: Secondary | ICD-10-CM | POA: Insufficient documentation

## 2023-12-25 DIAGNOSIS — M5489 Other dorsalgia: Secondary | ICD-10-CM

## 2023-12-25 DIAGNOSIS — R530 Neoplastic (malignant) related fatigue: Secondary | ICD-10-CM

## 2023-12-25 DIAGNOSIS — C7951 Secondary malignant neoplasm of bone: Secondary | ICD-10-CM | POA: Insufficient documentation

## 2023-12-25 DIAGNOSIS — Z5112 Encounter for antineoplastic immunotherapy: Secondary | ICD-10-CM | POA: Diagnosis not present

## 2023-12-25 DIAGNOSIS — Z79891 Long term (current) use of opiate analgesic: Secondary | ICD-10-CM | POA: Insufficient documentation

## 2023-12-25 LAB — CMP (CANCER CENTER ONLY)
ALT: 6 U/L (ref 0–44)
AST: 16 U/L (ref 15–41)
Albumin: 3.3 g/dL — ABNORMAL LOW (ref 3.5–5.0)
Alkaline Phosphatase: 83 U/L (ref 38–126)
Anion gap: 5 (ref 5–15)
BUN: 26 mg/dL — ABNORMAL HIGH (ref 8–23)
CO2: 24 mmol/L (ref 22–32)
Calcium: 9.4 mg/dL (ref 8.9–10.3)
Chloride: 110 mmol/L (ref 98–111)
Creatinine: 1.23 mg/dL — ABNORMAL HIGH (ref 0.44–1.00)
GFR, Estimated: 48 mL/min — ABNORMAL LOW (ref >60)
Glucose, Bld: 117 mg/dL — ABNORMAL HIGH (ref 70–99)
Potassium: 3.7 mmol/L (ref 3.5–5.1)
Sodium: 139 mmol/L (ref 135–145)
Total Bilirubin: 0.5 mg/dL (ref 0.0–1.2)
Total Protein: 6.3 g/dL — ABNORMAL LOW (ref 6.5–8.1)

## 2023-12-25 LAB — CBC WITH DIFFERENTIAL (CANCER CENTER ONLY)
Abs Immature Granulocytes: 0.05 10*3/uL (ref 0.00–0.07)
Basophils Absolute: 0.1 10*3/uL (ref 0.0–0.1)
Basophils Relative: 1 %
Eosinophils Absolute: 0.1 10*3/uL (ref 0.0–0.5)
Eosinophils Relative: 1 %
HCT: 29.3 % — ABNORMAL LOW (ref 36.0–46.0)
Hemoglobin: 9.7 g/dL — ABNORMAL LOW (ref 12.0–15.0)
Immature Granulocytes: 1 %
Lymphocytes Relative: 12 %
Lymphs Abs: 1.1 10*3/uL (ref 0.7–4.0)
MCH: 30.1 pg (ref 26.0–34.0)
MCHC: 33.1 g/dL (ref 30.0–36.0)
MCV: 91 fL (ref 80.0–100.0)
Monocytes Absolute: 0.9 10*3/uL (ref 0.1–1.0)
Monocytes Relative: 9 %
Neutro Abs: 7.2 10*3/uL (ref 1.7–7.7)
Neutrophils Relative %: 76 %
Platelet Count: 236 10*3/uL (ref 150–400)
RBC: 3.22 MIL/uL — ABNORMAL LOW (ref 3.87–5.11)
RDW: 13.1 % (ref 11.5–15.5)
WBC Count: 9.3 10*3/uL (ref 4.0–10.5)
nRBC: 0 % (ref 0.0–0.2)

## 2023-12-25 MED ORDER — METHYLPREDNISOLONE 4 MG PO TBPK: ORAL_TABLET | ORAL | 0 refills | Status: DC

## 2023-12-25 MED ORDER — HEPARIN SOD (PORK) LOCK FLUSH 100 UNIT/ML IV SOLN
500.0000 [IU] | Freq: Once | INTRAVENOUS | Status: AC | PRN
  Administered 2023-12-25: 500 [IU]

## 2023-12-25 MED ORDER — METHADONE HCL 5 MG PO TABS
5.0000 mg | ORAL_TABLET | Freq: Three times a day (TID) | ORAL | 0 refills | Status: DC
Start: 2023-12-25 — End: 2024-01-05

## 2023-12-25 MED ORDER — PROCHLORPERAZINE MALEATE 10 MG PO TABS
10.0000 mg | ORAL_TABLET | Freq: Once | ORAL | Status: AC
  Administered 2023-12-25: 10 mg via ORAL
  Filled 2023-12-25: qty 1

## 2023-12-25 MED ORDER — SODIUM CHLORIDE 0.9% FLUSH
10.0000 mL | INTRAVENOUS | Status: DC | PRN
  Administered 2023-12-25: 10 mL

## 2023-12-25 MED ORDER — SODIUM CHLORIDE 0.9 % IV SOLN
80.0000 mg | Freq: Once | INTRAVENOUS | Status: AC
  Administered 2023-12-25: 80 mg via INTRAVENOUS
  Filled 2023-12-25: qty 6

## 2023-12-25 MED ORDER — SODIUM CHLORIDE 0.9 % IV SOLN: INTRAVENOUS | Status: DC

## 2023-12-25 MED ORDER — TIZANIDINE HCL 4 MG PO TABS: 4.0000 mg | ORAL_TABLET | Freq: Every day | ORAL | 3 refills | Status: DC

## 2023-12-25 NOTE — Progress Notes (Signed)
 Patient identified as positive for malnutrition risk on the MST report.  68 year old female diagnosed diagnosed with metastatic bladder cancer receiving palliative treatment using enfortumab vedotin -ejfv (PADCEV ).  She is followed by [name].  Past medical history includes diverticulitis, cardiomyopathy, chronic constipation, hypoxic respiratory failure, congestive heart failure, colonic fistula, COPD, coronary artery disease, dehydration, GERD, MI, small bowel obstruction.  Medications include Vitamin B12, Decadron , Marinol , Nexium , ferrous sulfate , Folvite , multivitamin, Zofran , Compazine   Height: 4 feet 10 inches. Weight: 142 pounds 4 ounces May 1 Patient weighed 150 pounds in January 2025. BMI: 29.73.  Patient generally eats 1 meal a day.  She drinks an original Ensure every morning.  She has been trying to eat healthier foods and has been adding more protein foods.  She has never eaten very much.  She has not increased her water  intake to approximately 64 ounces daily per MD instructions.  Noted Lasix  is on hold.  No nutrition impact symptoms at this time.  Nutrition diagnosis: Unintended weight loss related to metastatic cancer as evidenced by 8 pound weight loss over 4 months.  Intervention: Encourage small frequent meals and snacks.  Continue 1 carton of Ensure daily.  Include high-protein foods throughout the day. Strive for weight maintenance. Provided nutrition fact sheet and contact information.  Questions were answered.  Monitoring, evaluation, goals: Patient will tolerate adequate calories and protein to minimize weight loss throughout treatment.  Next visit: To be scheduled as needed.  **Disclaimer: This note was dictated with voice recognition software. Similar sounding words can inadvertently be transcribed and this note may contain transcription errors which may not have been corrected upon publication of note.**

## 2023-12-25 NOTE — Assessment & Plan Note (Addendum)
 marinol 5 mg twice daily

## 2023-12-25 NOTE — Assessment & Plan Note (Signed)
 Will be seeing palliative care today

## 2023-12-25 NOTE — Progress Notes (Signed)
 Palliative Medicine [facility]  [phone] [phone]   Name: [name] Date: 12/25/2023 MRN: [id number]  DOB: [birth year]  Patient Care Team: Janece Means, FNP as PCP - General (Family Medicine) Lei Pump, MD as PCP - Electrophysiology (Cardiology) Manfred Seed, MD as PCP - Cardiology (Cardiology) Garry Kansas, MD as Consulting Physician (Neurosurgery) Manfred Seed, MD as Consulting Physician (Cardiology) Candyce Champagne, MD as Consulting Physician (General Surgery) Merriam Abbey, DO as Consulting Physician (Neurology) Steven Elam, [facility] (Inactive) as Pharmacist (Pharmacist) Genell Ken, MD as Consulting Physician (Gastroenterology) Zehr, Martina Sledge, PA-C as Physician Assistant (Gastroenterology) Cristi Donalds, MD as Consulting Physician (Nephrology) Pickenpack-Cousar, Giles Labrum, NP as Nurse Practitioner (Hospice and Palliative Medicine)    REASON FOR CONSULTATION: [name] is a 68 y.o. female with oncologic medical history including metastatic bladder cancer with T5 bone metastasis, COPD, chronic back pain from DDD, osteoporosis with fractures, chronic pain management at [facility].  Palliative is seeing patient for symptom management and goals of care.    SOCIAL HISTORY:     reports that she quit smoking about 3 years ago. Her smoking use included cigarettes. She started smoking about 33 years ago. She has a 15 pack-year smoking history. She has never used smokeless tobacco. She reports current alcohol use of about 2.0 standard drinks of alcohol per week. She reports that she does not use drugs.  ADVANCE DIRECTIVES:  None on file  CODE STATUS: Full code  PAST MEDICAL HISTORY: Past Medical History:  Diagnosis Date   Abdominal pain 10/17/2020   Abnormality of gait due to impairment of balance 11/22/2020   Acute diverticulitis 10/17/2020   Admission for long-term opiate analgesic use  10/24/2019   Arthritis of right hip 05/29/2016   Formatting of this note might be different from the original. Added automatically from request for surgery [id number]   BMI 26.0-26.9,adult 03/29/2020   Bronchial asthma    Cardiomyopathy (HCC)    Overview:  Ejection fraction 45% in 2015 Ejection fraction 30 to 35% in November 2018   Chronic back pain    Chronic constipation 07/31/2021   Chronic hypoxemic respiratory failure (HCC) 10/24/2019   Chronic narcotic use 03/23/2015   Chronic pain of both knees 12/21/2018   Added automatically from request for surgery [id number]  Formatting of this note might be different from the original. 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   Hypotension 05/17/2020   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   Ileus following gastrointestinal surgery (HCC) 03/26/2015   Major depressive disorder, single episode, moderate (HCC) 10/24/2019   Malnutrition of moderate degree (HCC) 10/20/2020   Mesenteric ischemia (HCC) 10/16/2020   Migraine without aura with status migrainosus 10/24/2019   Mixed incontinence 10/20/2020   Myocardial infarction (HCC)  Nausea & vomiting    Obstructive chronic bronchitis with  exacerbation (HCC) 10/25/2019   Other spondylosis with radiculopathy, lumbar region 10/24/2019   Persistent vomiting 05/17/2020   Presence of left artificial hip joint 10/24/2019   PTSD (post-traumatic stress disorder)    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 03/23/2015 04/19/2014   Senile osteoporosis 10/24/2019   Sepsis (HCC) 10/17/2020   Serosanguineous chronic otitis media of right ear 08/02/2021   Small bowel obstruction (HCC)    Thyroid  disease    Upper respiratory tract infection due to COVID-19 virus 07/06/2021   Wellness examination 03/27/2021    PAST SURGICAL HISTORY:  Past Surgical History:  Procedure Laterality Date   APPENDECTOMY     CARDIAC CATHETERIZATION     CARPAL TUNNEL RELEASE     CESAREAN SECTION     CHF s/p AICD   12/2010   CHOLECYSTECTOMY     COLONOSCOPY  10/16/2015   Mild colonic diverticulosis, predominantly in the left colon. Small internal hemrrhoids. Otherwise normal colonoscopy   COLONOSCOPY WITH PROPOFOL  N/A 09/27/2021   Procedure: COLONOSCOPY WITH PROPOFOL ;  Surgeon: Sergio Dandy, MD;  Location: [facility];  Service: Endoscopy;  Laterality: N/A;   CORONARY ANGIOPLASTY     ESOPHAGOGASTRODUODENOSCOPY  02/04/2014   Mild gastritis. Otherwise normal EGD   HAND SURGERY Bilateral    ICD GENERATOR CHANGEOUT N/A 05/21/2019   Procedure: ICD GENERATOR CHANGEOUT;  Surgeon: Lei Pump, MD;  Location: [facility] INVASIVE CV LAB;  Service: Cardiovascular;  Laterality: N/A;   ICD IMPLANT     Medtronic   intestinal blockage 2011     IR IMAGING GUIDED PORT INSERTION  12/24/2023   IR NEPHROSTOMY PLACEMENT RIGHT  11/16/2023   IR URETERAL STENT PLACEMENT EXISTING ACCESS RIGHT  12/24/2023   LAPAROSCOPIC ASSISTED VENTRAL HERNIA REPAIR N/A 03/23/2015   Procedure: LAPAROSCOPIC VENTRAL WALL HERNIA REPAIR;  Surgeon: Candyce Champagne, MD;  Location: [facility]S;  Service: General;  Laterality: N/A;  With MESH   LAPAROSCOPIC LYSIS OF ADHESIONS  N/A 03/23/2015   Procedure: LAPAROSCOPIC LYSIS OF ADHESIONS;  Surgeon: Candyce Champagne, MD;  Location: [facility];  Service: General;  Laterality: N/A;   LSCS      x2   NASAL SEPTUM SURGERY     NECK SURGERY     fused   TONSILLECTOMY     TRANSURETHRAL RESECTION OF BLADDER TUMOR N/A 11/16/2023   Procedure: TURBT (TRANSURETHRAL RESECTION OF BLADDER TUMOR);  Surgeon: Adelbert Homans, MD;  Location: [facility] OR[facility];  Service: Urology;  Laterality: N/A;   TYMPANOSTOMY TUBE PLACEMENT Right 2024   ULNAR NERVE TRANSPOSITION  01/23/2012   Procedure: ULNAR NERVE DECOMPRESSION/TRANSPOSITION;  Surgeon: Elder Greening, MD;  Location: [facility] NEURO ORS;  Service: Neurosurgery;  Laterality: Left;  LEFT ulnar nerve decompression    HEMATOLOGY/ONCOLOGY HISTORY:  Oncology History  Urothelial carcinoma of bladder (HCC)  11/18/2023 Initial Diagnosis   Urothelial carcinoma of bladder (HCC)   12/01/2023 Cancer Staging   Staging form: Urinary Bladder, AJCC 8th Edition - Clinical: Stage IVB (cTX, cNX, pM1b) - Signed by Lowanda Ruddy, MD on 12/01/2023 WHO/ISUP grade (low/high): High Grade Histologic grading system: 2 grade system   12/18/2023 -  Chemotherapy   Patient is on Treatment Plan : UROTHELIAL ADVANCED, METASTATIC ENFORTUMAB D1, D8 + PEMBROLIZUMAB  (200) D1 Q21D       ALLERGIES:  has no known allergies.  MEDICATIONS:  Current Outpatient Medications  Medication Sig Dispense Refill   methadone  (DOLOPHINE ) 5 MG tablet Take  1 tablet (5 mg total) by mouth every 8 (eight) hours. 45 tablet 0   ARIPiprazole  (ABILIFY ) 5 MG tablet TAKE 1 TABLET BY MOUTH DAILY 30 tablet 10   aspirin  81 MG tablet Take 81 mg by mouth daily.     BREO ELLIPTA  100-25 MCG/ACT AEPB INHALE 1 PUFF INTO THE LUNGS DAILY. (Patient taking differently: Inhale 1 puff into the lungs daily as needed (SOB).) 60 each 10   Carboxymethylcell-Glycerin  PF 0.5-0.9 % SOLN Place 2 drops into both eyes 4 (four) times daily. 1 each 11   carvedilol  (COREG )  3.125 MG tablet TAKE ONE (1) TABLET BY MOUTH TWICE DAILY WITH MEALS (Patient taking differently: Take 3.125 mg by mouth 2 (two) times daily with a meal.) 60 tablet 10   denosumab  (PROLIA ) 60 MG/ML SOSY injection Inject 60 mg into the skin every 6 (six) months. 180 mL 2   dextromethorphan  15 MG/5ML syrup Take 10 mLs (30 mg total) by mouth 4 (four) times daily as needed for cough. 120 mL 0   diclofenac  Sodium (VOLTAREN ) 1 % GEL Apply 2 g topically 4 (four) times daily. (Patient taking differently: Apply 2 g topically as needed (Pain).) 50 g 4   dronabinol  (MARINOL ) 5 MG capsule Take 1 capsule (5 mg total) by mouth 2 (two) times daily before a meal. 60 capsule 0   ENTRESTO  24-26 MG TAKE ONE (1) TABLET BY MOUTH TWICE DAILY (Patient taking differently: Take 1 tablet by mouth 2 (two) times daily.) 60 tablet 10   EPINEPHRINE  0.3 mg/0.3 mL IJ SOAJ injection Inject 0.3 mLs (0.3 mg total) into the muscle as needed for anaphylaxis. 1 each 2   esomeprazole  (NEXIUM ) 20 MG capsule TAKE ONE (1) CAPSULE BY MOUTH ONCE DAILY (Patient taking differently: Take 20 mg by mouth daily at 12 noon.) 90 capsule 1   FARXIGA 5 MG TABS tablet Take 5 mg by mouth daily.     ferrous sulfate  325 (65 FE) MG tablet Take 1 tablet (325 mg total) by mouth at bedtime.     fluticasone  (FLONASE ) 50 MCG/ACT nasal spray USE 2 SPRAYS IN EACH NOSTRILS DAILY AS NEEDED (Patient taking differently: Place 2 sprays into both nostrils daily as needed for allergies or rhinitis.) 48 g 0   folic acid  (FOLVITE ) 1 MG tablet Take 1 tablet (1 mg total) by mouth daily.     ipratropium-albuterol  (DUONEB) 0.5-2.5 (3) MG/3ML SOLN USE 1 VIAL VIA NEBULIZER EVERY 6 HOURS AS NEEDED FOR SHORTNESS OF BREATH (Patient taking differently: Take 3 mLs by nebulization every 6 (six) hours as needed (SOB).) 90 mL 0   levocetirizine (XYZAL) 5 MG tablet Take 5 mg by mouth daily as needed for allergies.     lidocaine  (LIDODERM ) 5 % Place 1 patch onto the skin daily. Remove &  Discard patch within 12 hours or as directed by MD     lidocaine -prilocaine  (EMLA ) cream Apply to affected area once (Patient taking differently: Apply 1 Application topically once. Apply to affected area once) 30 g 3   lubiprostone  (AMITIZA ) 24 MCG capsule TAKE 1 CAPSULE(24 MCG) BY MOUTH TWICE DAILY WITH A MEAL (Patient taking differently: Take 24 mcg by mouth 2 (two) times daily with a meal.) 180 capsule 1   methylPREDNISolone  (MEDROL  DOSEPAK) 4 MG TBPK tablet If significant rash, will start at 6 tabs on day 1, then 5 tabs on day 2, then 4 tabs on day 3, then 3 tabs on day 4, then 2 tabs on day 5 and then 1  tab on day 6. Take with food. 21 tablet 0   Multiple Vitamin (MULTIVITAMIN WITH MINERALS) TABS tablet Take 1 tablet by mouth daily.     naloxone  (NARCAN ) nasal spray 4 mg/0.1 mL Place 1 spray into the nose once.     nitroGLYCERIN  (NITROSTAT ) 0.4 MG SL tablet Place 1 tablet (0.4 mg total) under the tongue every 5 (five) minutes as needed for chest pain. 25 tablet 6   ondansetron  (ZOFRAN ) 4 MG tablet Take 1 tablet (4 mg total) by mouth every 8 (eight) hours as needed for nausea or vomiting. 20 tablet 0   ondansetron  (ZOFRAN ) 8 MG tablet Take 1 tablet (8 mg total) by mouth every 8 (eight) hours as needed for nausea or vomiting. 30 tablet 1   oxyCODONE  (ROXICODONE ) 15 MG immediate release tablet Take 1 tablet (15 mg total) by mouth every 8 (eight) hours as needed for pain. 60 tablet 0   prochlorperazine  (COMPAZINE ) 10 MG tablet TAKE 1 TABLET(10 MG) BY MOUTH EVERY 6 HOURS AS NEEDED FOR NAUSEA OR VOMITING 30 tablet 1   ranolazine  (RANEXA ) 500 MG 12 hr tablet TAKE 1 TABLET BY MOUTH TWICE DAILY 60 tablet 10   rosuvastatin  (CRESTOR ) 20 MG tablet TAKE 1 TABLET BY MOUTH ONCE DAILY 90 tablet 1   sertraline  (ZOLOFT ) 100 MG tablet TAKE 1/2 TABLET(50 MG) BY MOUTH DAILY (Patient taking differently: Take 50 mg by mouth daily.) 45 tablet 1   tiZANidine  (ZANAFLEX ) 4 MG tablet Take 1 tablet (4 mg total) by mouth at  bedtime. 30 tablet 3   topiramate  (TOPAMAX ) 50 MG tablet TAKE TWO (2) TABLETS BY MOUTH EVERY DAY AT BEDTIME (Patient taking differently: Take 50 mg by mouth at bedtime. TAKE TWO (2) TABLETS BY MOUTH EVERY DAY AT BEDTIME) 60 tablet 10   Ubrogepant  (UBRELVY ) 100 MG TABS Take 1 tablet (100 mg total) by mouth as needed (May repeat after 2 hours.  Maximum 2 tablets in 24 hours). TAKE 1 TABLET BY MOUTH BY MOUTH AS NEEDED( MAY REPEAT 1 TABLET AFTER 2 HOURS IF NEEDED, MAXIMUM 2 TABLETS IN 24 HOURS) Strength: 100 mg 48 tablet 1   Vitamin D , Ergocalciferol , (DRISDOL ) 1.25 MG (50000 UNIT) CAPS capsule TAKE 1 CAPSULE BY MOUTH EVERY 7 DAYS FOR 12 DOSES (Patient taking differently: Take 50,000 Units by mouth every 7 (seven) days. Patient usually takes this medication on mondays) 12 capsule 0   No current facility-administered medications for this visit.   Facility-Administered Medications Ordered in Other Visits  Medication Dose Route Frequency Provider Last Rate Last Admin   0.9 %  sodium chloride  infusion   Intravenous Continuous Lowanda Ruddy, MD 10 mL/hr at 12/25/23 1508 New Bag at 12/25/23 1508   enfortumab vedotin -ejfv (PADCEV ) 80 mg in sodium chloride  0.9 % 50 mL (1.3793 mg/mL) chemo infusion  80 mg Intravenous Once Lowanda Ruddy, MD 116 mL/hr at 12/25/23 1629 80 mg at 12/25/23 1629   heparin  lock flush 100 unit/mL  500 Units Intracatheter Once PRN Lowanda Ruddy, MD       sodium chloride  flush (NS) 0.9 % injection 10 mL  10 mL Intracatheter PRN Lowanda Ruddy, MD        VITAL SIGNS: There were no vitals taken for this visit. There were no vitals filed for this visit.  Estimated body mass index is 29.73 kg/m as calculated from the following:   Height as of 12/24/23: 4\' 10"  (1.473 m).   Weight as of an earlier encounter on 12/25/23: 142  lb 4 oz (64.5 kg).  LABS: CBC:    Component Value Date/Time   WBC 9.3 12/25/2023 1406   WBC 9.4 12/24/2023 1245   HGB 9.7 (L) 12/25/2023 1406   HGB 12.7  09/25/2023 1449   HCT 29.3 (L) 12/25/2023 1406   HCT 37.5 09/25/2023 1449   PLT 236 12/25/2023 1406   PLT 314 09/25/2023 1449   MCV 91.0 12/25/2023 1406   MCV 87 09/25/2023 1449   NEUTROABS 7.2 12/25/2023 1406   NEUTROABS 8.8 (H) 09/25/2023 1449   LYMPHSABS 1.1 12/25/2023 1406   LYMPHSABS 0.6 (L) 09/25/2023 1449   MONOABS 0.9 12/25/2023 1406   EOSABS 0.1 12/25/2023 1406   EOSABS 0.0 09/25/2023 1449   BASOSABS 0.1 12/25/2023 1406   BASOSABS 0.0 09/25/2023 1449   Comprehensive Metabolic Panel:    Component Value Date/Time   NA 139 12/25/2023 1406   NA 139 09/25/2023 1449   K 3.7 12/25/2023 1406   CL 110 12/25/2023 1406   CO2 24 12/25/2023 1406   BUN 26 (H) 12/25/2023 1406   BUN 32 (H) 09/25/2023 1449   CREATININE 1.23 (H) 12/25/2023 1406   GLUCOSE 117 (H) 12/25/2023 1406   CALCIUM  9.4 12/25/2023 1406   AST 16 12/25/2023 1406   ALT 6 12/25/2023 1406   ALKPHOS 83 12/25/2023 1406   BILITOT 0.5 12/25/2023 1406   PROT 6.3 (L) 12/25/2023 1406   PROT 6.5 09/25/2023 1449   ALBUMIN  3.3 (L) 12/25/2023 1406   ALBUMIN  4.3 09/25/2023 1449    RADIOGRAPHIC STUDIES: IR IMAGING GUIDED PORT INSERTION Result Date: 12/24/2023 CLINICAL DATA:  Urothelial carcinoma, needs durable venous access for planned treatment regimen EXAM: TUNNELED PORT CATHETER PLACEMENT WITH ULTRASOUND AND FLUOROSCOPIC GUIDANCE FLUOROSCOPY: Radiation Exposure Index (as provided by the fluoroscopic device): 133 mGy air Kerma ANESTHESIA/SEDATION: Intravenous Fentanyl  100mcg and Versed  2mg  were administered by RN during a total moderate (conscious) sedation time of 45 minutes; the patient's level of consciousness and physiological / cardiorespiratory status were monitored continuously by radiology RN under my direct supervision. TECHNIQUE: The procedure, risks, benefits, and alternatives were explained to the patient. Questions regarding the procedure were encouraged and answered. The patient understands and consents to the  procedure. Patency of the right IJ vein was confirmed with ultrasound with image documentation. An appropriate skin site was determined. Skin site was marked. Region was prepped using maximum barrier technique including cap and mask, sterile gown, sterile gloves, large sterile sheet, and Chlorhexidine  as cutaneous antisepsis. The region was infiltrated locally with 1% lidocaine . Under real-time ultrasound guidance, the right IJ vein was accessed with a 21 gauge micropuncture needle; the needle tip within the vein was confirmed with ultrasound image documentation. Needle was exchanged over a 018 guidewire for transitional dilator, and vascular measurement was performed. A small incision was made on the right anterior chest wall and a subcutaneous pocket fashioned. The power-injectable port was positioned and its catheter tunneled to the right IJ dermatotomy site. The transitional dilator was exchanged over an Amplatz wire for a peel-away sheath, through which the port catheter, which had been trimmed to the appropriate length, was advanced and positioned under fluoroscopy with its tip at the cavoatrial junction. Spot chest radiograph confirms good catheter position and no pneumothorax. The port was flushed per protocol. The pocket was closed with deep interrupted and subcuticular continuous 3-0 Monocryl sutures. The incisions were covered with Dermabond then covered with a sterile dressing. The patient tolerated the procedure well. COMPLICATIONS: COMPLICATIONS None immediate IMPRESSION: Technically successful right  IJ power-injectable port catheter placement. Ready for routine use. Electronically Signed   By: Nicoletta Barrier M.D.   On: 12/24/2023 18:35   IR URETERAL STENT PLACEMENT EXISTING ACCESS RIGHT Result Date: 12/24/2023 CLINICAL DATA:  Urothelial carcinoma with distal right ureteral obstruction status post percutaneous nephrostomy catheter placement. EXAM: RIGHT ANTEGRADE DOUBLE-J URETERAL STENT PLACEMENT RIGHT  PERCUTANEOUS NEPHROSTOMY CATHETER EXCHANGE UNDER FLUOROSCOPY TECHNIQUE: The nephrostomy tube and surrounding skin were prepped with chlorhexidine , draped in usual sterile fashion. A small amount of contrast was injected through the right nephrostomy catheter to opacify the renal collecting system. The catheter was cut and exchanged over a 0.035" angiographic wire for a a 5 French angled angiographic catheter, advanced with the aid of a Bentson and Glidewire down the right ureter and across the distal ureteral obstruction due to the lumen of the urinary bladder. Wire exchanged for an Materials engineer. 22 cm 8.5 French double-J ureteral stent was deployed from the urinary bladder to the central right renal collecting system. Injection confirms good positioning and patency. A new 10-French pigtail catheter was then advanced over the guidewire, formed centrally within the collecting system under fluoroscopy. Contrast injection confirms appropriate positioning. Catheter was secured externally with 0 Prolene suture and capped to allow internal drainage. FLUOROSCOPY: Radiation Exposure Index (as provided by the fluoroscopic device): 133 mGy air Kerma COMPLICATIONS: None immediate IMPRESSION: 1. Technically successful placement of 22 French 8.5 French double-J ureteral stent across distal right ureteral obstruction. 2. Exchange of right nephrostomy catheter under fluoroscopy, left capped to allow internal drainage. Patient to return in 1 week for nephrostomy catheter removal if tolerated. Electronically Signed   By: Nicoletta Barrier M.D.   On: 12/24/2023 16:25   NM PET Image Restag (PS) Skull Base To Thigh Result Date: 12/12/2023 CLINICAL DATA:  Subsequent treatment strategy for bladder cancer. Staging. Transurethral resection 11/16/2023. EXAM: NUCLEAR MEDICINE PET SKULL BASE TO THIGH TECHNIQUE: 7.2 mCi F-18 FDG was injected intravenously. Full-ring PET imaging was performed from the skull base to thigh after the radiotracer. CT  data was obtained and used for attenuation correction and anatomic localization. Fasting blood glucose: 101 mg/dl COMPARISON:  Pelvic CT of 11/17/2023. Bone scan of 11/19/2023. Chest CT 10/17/2023. Abdominopelvic CT 10/25/2020 FINDINGS: Mediastinal blood pool activity: SUV max 3.1 Liver activity: SUV max NA NECK: Muscular activity within the neck. Given this mild limitation, no cervical nodal hypermetabolism. Incidental CT findings: No cervical adenopathy. CHEST: No pulmonary parenchymal or thoracic nodal hypermetabolism. Incidental CT findings: Pacer. Aortic atherosclerosis. Moderate cardiomegaly. ABDOMEN/PELVIS: No abdominopelvic nodal hypermetabolism. Although the pelvis is poorly evaluated secondary to beam hardening artifact from right hip arthroplasty, there is hypermetabolism which corresponds to a central pelvic soft tissue density. Example at a S.U.V. max of 14.9, including on 140/4. This most likely represents the uterus. Incidental CT findings: Cholecystectomy. Normal adrenal glands. Right nephrostomy tube. Right renal cortical thinning. Interpolar right renal 1.2 cm low-density lesion is likely a cyst . In the absence of clinically indicated signs/symptoms require(s) no independent follow-up. Right renal cortical thinning with probable moderate right hydronephrosis but no hydroureter. Mild to moderate left hydroureteronephrosis, followed to the bladder. Distal ureter poorly visualized. Foley catheter decompresses the urinary bladder. Abdominal aortic atherosclerosis. SKELETON: Multifocal hypermetabolic osseous metastasis. Index lytic lesion within the left pubic bone of 1.4 cm and a S.U.V. max of 9.6 on 150/4. A left-sided vertebral body lesion with extension into the pedicle and posterior elements is favored to be at T2, including at a S.U.V. max of 8.0  on 23/4. Bilateral hypermetabolic foci within ribs could be posttraumatic or also represent metastasis. Bilateral sacral hypermetabolism corresponds to  insufficiency fractures including on 122/4. Incidental CT findings: Right hip arthroplasty. Cervical spine fixation. Lower lateral right rib nonacute fractures. IMPRESSION: 1. Multifocal osseous metastasis as detailed above. 2. Although evaluation of the pelvis is degraded secondary to beam hardening artifact from right hip arthroplasty, hypermetabolic soft tissue density within the central pelvis is seen. This likely represents uterine hypermetabolism and a synchronous uterine primary would be a concern. If the patient has had a prior hysterectomy, extracystic extension of tumor would be favored. If the patient has not had hysterectomy, consider pelvic ultrasound. 3. Otherwise, no evidence of extraosseous metastatic disease. 4. Left-sided hydroureteronephrosis followed to the level of the lower pelvis. No obstructive stone seen. Presumably related to the bladder primary. 5. Incidental findings, including: Aortic Atherosclerosis (ICD10-I70.0). Possible constipation. Sacral insufficiency fractures bilaterally. Electronically Signed   By: Lore Rode M.D.   On: 12/12/2023 18:33   CUP PACEART REMOTE DEVICE CHECK Result Date: 12/10/2023 Scheduled remote reviewed. Normal device function.  Presenting rhythm:  AS/VS HF diagnostics currenlty abnormal, followed by ICM Next remote 91 days. LA, CVRS   PERFORMANCE STATUS (ECOG) : 3 - Symptomatic, >50% confined to bed  Review of Systems  Constitutional:  Positive for activity change and fatigue.  Gastrointestinal:  Positive for abdominal pain.  Musculoskeletal:  Positive for back pain.  Unless otherwise noted, a complete review of systems is negative.  Physical Exam General: NAD Cardiovascular: regular rate and rhythm Pulmonary: clear ant fields Abdomen: soft, nontender, + bowel sounds Extremities: no edema, no joint deformities Skin: no rashes Neurological: Alert and oriented x3  IMPRESSION: Discussed the use of AI scribe software for clinical note  transcription with the patient, who gave verbal consent to proceed.  This is Ms. Branch's initial palliative visit. Patient seen during infusion. Tolerating well. She is accompanied by her daughter, [name]. Patient is resting comfortably in the recliner. Alert and able to engage appropriately in discussions.   I introduced myself, Maygan RN, and Palliative's role in collaboration with the oncology team. Concept of Palliative Care was introduced as specialized medical care for people and their families living with serious illness.  It focuses on providing relief from the symptoms and stress of a serious illness.  The goal is to improve quality of life for both the patient and the family. Values and goals of care important to patient and family were attempted to be elicited.   Ms. Horse lives in the home alone however over the past several weeks-months her daughter has stayed with her 24/7. Patient is disabled due to health challenges.   In the home daughter is performing most ADLs including bathing and toileting. Patient has an indwelling foley catheter. For bowel movements she uses bedpan and rolls for perineal care per daughter. Appetite is fair. Some days are better than others. Most days consumes one meal with small snacks like bananas or yogurt. Her current weight is 142 pounds. No recent constipation or diarrhea, though she has used stool softeners in the past. Mobility is limited and she mainly stays in bed due to significant back pain and weakness.   Her current medications include Abilify , aspirin , Breo Ellipta , carvedilol , vitamin D , Prolia , Voltaren  gel, Entresto , Nexium , iron, Flonase , folic acid , lidocaine  patch, Amitiza , Zofran , Crestor , Zoloft , and Ubrelvy . She is out of tizanidine  and has not received Marinol  due to pharmacy issues. Explained medication is currently on backorder. She uses  a pharmacy service for medication delivery, except for pain medications. States medications come in  daily blister packs. Her daughter assist in medication management.   Ayleah experiences chronic pain primarily in her neck, lower pelvic area, upper back, lower back, and shoulders. The pain is described as throbbing and can become severe, reaching a 10 out of 10 on the pain scale, particularly in her lower back where it can impair her ability to move her legs. Pain relief is achieved with medication, reducing the pain to a 6 out of 10, but she can still constantly feel it.  Her current pain management regimen includes Belbuca  900 mcg film twice a day, which she is running low on and has been trying to take only as needed. She also takes oxycodone  15 mg, which was increased from which was increased from 10 mg, and reports that the higher dose helps with her pain. Previously her pain was being managed by [name] with [facility] however since diagnosis they have requested for pain management to be transferred to the Cancer Center due to the nature of increase pain and discomfort.   Complete physical, medication, medical, and psychosocial review completed.  Patient denies any use of illicit drugs or alcohol. Extensive discussions and explanation of palliative's role in collaboration with his oncology team to assist in his pain and symptom management. Patient and family verbalized understanding.   After extensive review and discussions patient will transition off of Belbuca  and will start on methadone  for extended-release pain management. Continue with oxycodone  as needed for breakthrough pain. Continue tizanidine  as needed for spasms. Patient states she generally takes once daily. Previously on gabapentin  however self-discontinued reporting she did not like how medication made her feel. Education provided on bowel regimen in the setting of opioid use to prevent constipation. Discussion provided on use of methadone  including administration, efficacy, and potential side effects. Patient and family  verbalized understanding.  All questions answered and support provided.   I discussed the importance of continued conversation with family and their medical providers regarding overall plan of care and treatment options, ensuring decisions are within the context of the patients values and GOCs. Assessment & Plan  Established therapeutic relationship. Education provided on palliative's role in collaboration with their Oncology/Radiation team.  Chronic pain Chronic pain in neck, lower pelvic area, upper back, and lower back. Severity 10/10 without medication, 6/10 with current medication. Previous pain management with [facility] however they are no longer following due to cancer diagnosis.  Transition to methadone  planned for effective pain management. Discontinue Belbuca  after today's dose. ??Initiate methadone  5mg  3 times daily starting tomorrow. MME calculated at 70 with 25% cross tolerance from Belbuca  dosing at 7.5mg  of methadone . Continue oxycodone  15mg  every 4 hours as needed as previously prescribed. ??Monitor for adverse reactions to methadone . Education provided on use. Ensure availability of amitiza  for potential constipation.  ??GERD GERD managed with Nexium . No current issues reported. -Continue Nexium  as prescribed.  Allergic rhinitis Allergic rhinitis managed with Flonase . No current exacerbations reported.  Continue Flonase  as needed.  Follow-up Monitor transition to methadone  and overall pain management. Call on Tuesday to assess response to methadone  and overall pain management. ??Instruct to contact provider if issues arise with pharmacy or medication refills. Will see back in the clinic in 2-3 weeks. Sooner if needed.   Patient expressed understanding and was in agreement with this plan. She also understands that She can call the clinic at any time with any questions, concerns, or  complaints.   Thank you for your referral and allowing Palliative to assist in Mrs.  Kashia Zalesky Burgner's care.   Number and complexity of problems addressed: HIGH - 1 or more chronic illnesses with SEVERE exacerbation, progression, or side effects of treatment - advanced cancer, pain. Any controlled substances utilized were prescribed in the context of palliative care.  Visit consisted of counseling and education dealing with the complex and emotionally intense issues of symptom management and palliative care in the setting of serious and potentially life-threatening illness.  Signed by: Dellia Ferguson, AGPCNP-BC Palliative Medicine Team/[facility]

## 2023-12-25 NOTE — Assessment & Plan Note (Addendum)
 continue EV/P today Seeing palliative care today Follow up before C2D1

## 2023-12-25 NOTE — Telephone Encounter (Signed)
 HH CERT/POC- ORDER #: [id number]

## 2023-12-25 NOTE — Progress Notes (Signed)
 Pt weighed on a scale this afternoon in the infusion room. Her weight is 142 lbs. Her Padcev  dose today calculates to 80mg  (1.25 mg/kg). Decrease Padcev  dose to 80mg  per MD.  Elodia Hailstone, RPH, BCPS, BCOP 12/25/2023 3:33 PM

## 2023-12-25 NOTE — Patient Instructions (Signed)
 [facility] - A DEPT OF [facility]  Discharge Instructions: Thank you for choosing [facility] to provide your oncology and hematology care.   If you have a lab appointment with the Cancer Center, please go directly to the Cancer Center and check in at the registration area.   Wear comfortable clothing and clothing appropriate for easy access to any Portacath or PICC line.   We strive to give you quality time with your provider. You may need to reschedule your appointment if you arrive late (15 or more minutes).  Arriving late affects you and other patients whose appointments are after yours.  Also, if you miss three or more appointments without notifying the office, you may be dismissed from the clinic at the provider's discretion.      For prescription refill requests, have your pharmacy contact our office and allow 72 hours for refills to be completed.    Today you received the following chemotherapy and/or immunotherapy agents: Padcev.      To help prevent nausea and vomiting after your treatment, we encourage you to take your nausea medication as directed.  BELOW ARE SYMPTOMS THAT SHOULD BE REPORTED IMMEDIATELY: *FEVER GREATER THAN 100.4 F (38 C) OR HIGHER *CHILLS OR SWEATING *NAUSEA AND VOMITING THAT IS NOT CONTROLLED WITH YOUR NAUSEA MEDICATION *UNUSUAL SHORTNESS OF BREATH *UNUSUAL BRUISING OR BLEEDING *URINARY PROBLEMS (pain or burning when urinating, or frequent urination) *BOWEL PROBLEMS (unusual diarrhea, constipation, pain near the anus) TENDERNESS IN MOUTH AND THROAT WITH OR WITHOUT PRESENCE OF ULCERS (sore throat, sores in mouth, or a toothache) UNUSUAL RASH, SWELLING OR PAIN  UNUSUAL VAGINAL DISCHARGE OR ITCHING   Items with * indicate a potential emergency and should be followed up as soon as possible or go to the Emergency Department if any problems should occur.  Please show the CHEMOTHERAPY ALERT CARD or IMMUNOTHERAPY  ALERT CARD at check-in to the Emergency Department and triage nurse.  Should you have questions after your visit or need to cancel or reschedule your appointment, please contact [facility] - A DEPT OF [facility]  Dept: [phone]  and follow the prompts.  Office hours are 8:00 a.m. to 4:30 p.m. Monday - Friday. Please note that voicemails left after 4:00 p.m. may not be returned until the following business day.  We are closed weekends and major holidays. You have access to a nurse at all times for urgent questions. Please call the main number to the clinic Dept: [phone] and follow the prompts.   For any non-urgent questions, you may also contact your provider using MyChart. We now offer e-Visits for anyone 35 and older to request care online for non-urgent symptoms. For details visit [email or web address].   Also download the MyChart app! Go to the app store, search "MyChart", open the app, select [place], and log in with your MyChart username and password.

## 2023-12-25 NOTE — Assessment & Plan Note (Addendum)
 If having severe rash over 10% of body will call for evaluation and start Medrol  dose pack Monitor irAE.  Monitor for skin toxicity Prophylactic Desitin twice daily to intertriginous, flexural, and acral areas Apply emollient moisturizer twice a day Use sunscreen if outside for prolonged period of time. Hydration 64+ ounces a day Avoid hot showers Monitor for neuropathy Monitor for hyperglycemia.  Baseline A1c Monitor for signs of pneumonitis Warning signs: Malaise.  Fever of more than 100.4.  Mucosal involvement in the ocular, oral or genital area.  Skin pain, burning, numbness tingling. Monitor for diarrhea and other GI symptoms Vit D/calcium  daily  Chronic pain with new cancer diagnosis Will see palliative care for her pain today.

## 2023-12-25 NOTE — Progress Notes (Signed)
 Pt was late for appointments today due to transportation issues. Daughter states that she has been having difficulty scheduling her ambulance transport via [facility], so she decided to transport pt today. Daughter states that she and pt's son were able to assist pt to daughter's vehicle today, but son did not accompany them to the Cancer Center. Daughter needed help getting pt from her vehicle into a w/c at the [facility] today. Due to late arrival, pt was sent directly to infusion. [name] to see her there. Discussed transportation issue w/ [name], Maudie Sorrow, SW, and K. Danner, who is speaking with [facility] about transportation options for pt, although per Pasqual Bone, SW, pt still meets criteria for EMS transport.

## 2023-12-25 NOTE — Assessment & Plan Note (Addendum)
 Repeat lab before each treatment Increase fluid at home

## 2023-12-25 NOTE — Progress Notes (Signed)
 [facility] OFFICE PROGRESS NOTE  Patient Care Team: Janece Means, FNP as PCP - General (Family Medicine) Lei Pump, MD as PCP - Electrophysiology (Cardiology) Manfred Seed, MD as PCP - Cardiology (Cardiology) Garry Kansas, MD as Consulting Physician (Neurosurgery) Manfred Seed, MD as Consulting Physician (Cardiology) Candyce Champagne, MD as Consulting Physician (General Surgery) Merriam Abbey, DO as Consulting Physician (Neurology) Steven Elam, [facility] (Inactive) as Pharmacist (Pharmacist) Genell Ken, MD as Consulting Physician (Gastroenterology) Zehr, Martina Sledge, PA-C as Physician Assistant (Gastroenterology) Cristi Donalds, MD as Consulting Physician (Nephrology) Pickenpack-Cousar, Giles Labrum, NP as Nurse Practitioner (Hospice and Palliative Medicine)  68 y.o.female with past medical history of COPD, chronic back from from DDD, osteoporosis with fractures presenting with back pain radiating down to the hip in March of 2025 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  Cystoscopy with TURBT show right sided bladder tumor with palpable anterior vaginal mass.  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    Current diagnosis: Stage IVB UC with bone metastases Treatment:  12/18/23 C1D1 EV/P  She continues to work with PT and able to walk a few feet. Pain is partially improved and will see palliative care today. Renal function improved.  Communicated with nurse, infusion, palliative care regarding her clinical status, including transportation issues, symptoms management, prescriptions. Assessment & Plan Urothelial carcinoma of bladder (HCC) continue EV/P today Seeing palliative care today Follow up before C2D1  Stage 3a chronic kidney disease (HCC) Repeat lab before each treatment Increase fluid at home At risk for side effect of medication If having severe rash over 10% of  body will call for evaluation and start Medrol  dose pack Monitor irAE.  Monitor for skin toxicity Prophylactic Desitin twice daily to intertriginous, flexural, and acral areas Apply emollient moisturizer twice a day Use sunscreen if outside for prolonged period of time. Hydration 64+ ounces a day Avoid hot showers Monitor for neuropathy Monitor for hyperglycemia.  Baseline A1c Monitor for signs of pneumonitis Warning signs: Malaise.  Fever of more than 100.4.  Mucosal involvement in the ocular, oral or genital area.  Skin pain, burning, numbness tingling. Monitor for diarrhea and other GI symptoms Vit D/calcium  daily  Chronic pain with new cancer diagnosis Will see palliative care for her pain today. Decreased appetite marinol  5 mg twice daily.  I spent a total of 45 minutes including review of chart and various tests results, face-to-face time with the patient, discussions about results, plan of care and coordination of care plan with other providers and staff members. Communicated with other members in the cancer center regarding her care.    Lowanda Ruddy, MD  INTERVAL HISTORY: Patient returns for follow-up. Report having trouble coming by ambulance today and daughter took her in the car and request more than one assist in getting out. Overall she had increase oral fluid and u/o is adequate. She had stent and remove and her tube. No dysuria. Foley in place. No bloody. Solid food intake is not great. No fever, cough, stomach pain. Lower pelvic pain in the bladder area. Neck pain about the same. Lower hip pain improved. She has not received her marinol . She has improved renal function. Fluid intake improved. Able to continue work with PT, standing and walking a few feet.   Oncology History  Urothelial carcinoma of bladder (HCC)  11/18/2023 Initial Diagnosis   Urothelial carcinoma of bladder (HCC)   12/01/2023 Cancer Staging  Staging form: Urinary Bladder, AJCC 8th Edition -  Clinical: Stage IVB (cTX, cNX, pM1b) - Signed by Lowanda Ruddy, MD on 12/01/2023 WHO/ISUP grade (low/high): High Grade Histologic grading system: 2 grade system   12/18/2023 -  Chemotherapy   Patient is on Treatment Plan : UROTHELIAL ADVANCED, METASTATIC ENFORTUMAB D1, D8 + PEMBROLIZUMAB  (200) D1 Q21D        PHYSICAL EXAMINATION: ECOG PERFORMANCE STATUS: 2 - Symptomatic, <50% confined to bed  VSS  GENERAL: alert, no distress and comfortable on chair SKIN: skin color normal; a few bruising on upper extremities No rash on exposed skin EYES: sclera clear LUNGS: clear to auscultation and percussion with normal breathing effort HEART: regular rate & rhythm  ABDOMEN: abdomen soft, non-tender and nondistended. Musculoskeletal: minimal LE edema  Relevant data reviewed during this visit included labs.

## 2023-12-26 ENCOUNTER — Other Ambulatory Visit: Payer: Self-pay

## 2023-12-26 ENCOUNTER — Ambulatory Visit: Attending: Cardiology | Admitting: Cardiology

## 2023-12-26 DIAGNOSIS — G8929 Other chronic pain: Secondary | ICD-10-CM

## 2023-12-26 MED ORDER — OXYCODONE HCL 15 MG PO TABS
15.0000 mg | ORAL_TABLET | Freq: Four times a day (QID) | ORAL | 0 refills | Status: DC | PRN
Start: 2023-12-27 — End: 2024-01-05

## 2023-12-27 DIAGNOSIS — D72829 Elevated white blood cell count, unspecified: Secondary | ICD-10-CM | POA: Diagnosis not present

## 2023-12-27 DIAGNOSIS — M4726 Other spondylosis with radiculopathy, lumbar region: Secondary | ICD-10-CM | POA: Diagnosis not present

## 2023-12-27 DIAGNOSIS — M47814 Spondylosis without myelopathy or radiculopathy, thoracic region: Secondary | ICD-10-CM | POA: Diagnosis not present

## 2023-12-27 DIAGNOSIS — G8929 Other chronic pain: Secondary | ICD-10-CM | POA: Diagnosis not present

## 2023-12-27 DIAGNOSIS — D630 Anemia in neoplastic disease: Secondary | ICD-10-CM | POA: Diagnosis not present

## 2023-12-27 DIAGNOSIS — J449 Chronic obstructive pulmonary disease, unspecified: Secondary | ICD-10-CM | POA: Diagnosis not present

## 2023-12-27 DIAGNOSIS — N1330 Unspecified hydronephrosis: Secondary | ICD-10-CM | POA: Diagnosis not present

## 2023-12-27 DIAGNOSIS — J9611 Chronic respiratory failure with hypoxia: Secondary | ICD-10-CM | POA: Diagnosis not present

## 2023-12-27 DIAGNOSIS — N1832 Chronic kidney disease, stage 3b: Secondary | ICD-10-CM | POA: Diagnosis not present

## 2023-12-27 DIAGNOSIS — M899 Disorder of bone, unspecified: Secondary | ICD-10-CM | POA: Diagnosis not present

## 2023-12-27 DIAGNOSIS — M069 Rheumatoid arthritis, unspecified: Secondary | ICD-10-CM | POA: Diagnosis not present

## 2023-12-27 DIAGNOSIS — I5042 Chronic combined systolic (congestive) and diastolic (congestive) heart failure: Secondary | ICD-10-CM | POA: Diagnosis not present

## 2023-12-27 DIAGNOSIS — F5101 Primary insomnia: Secondary | ICD-10-CM | POA: Diagnosis not present

## 2023-12-27 DIAGNOSIS — I420 Dilated cardiomyopathy: Secondary | ICD-10-CM | POA: Diagnosis not present

## 2023-12-27 DIAGNOSIS — G8220 Paraplegia, unspecified: Secondary | ICD-10-CM | POA: Diagnosis not present

## 2023-12-27 DIAGNOSIS — N179 Acute kidney failure, unspecified: Secondary | ICD-10-CM | POA: Diagnosis not present

## 2023-12-27 DIAGNOSIS — D520 Dietary folate deficiency anemia: Secondary | ICD-10-CM | POA: Diagnosis not present

## 2023-12-27 DIAGNOSIS — C679 Malignant neoplasm of bladder, unspecified: Secondary | ICD-10-CM | POA: Diagnosis not present

## 2023-12-27 DIAGNOSIS — D631 Anemia in chronic kidney disease: Secondary | ICD-10-CM | POA: Diagnosis not present

## 2023-12-27 DIAGNOSIS — E440 Moderate protein-calorie malnutrition: Secondary | ICD-10-CM | POA: Diagnosis not present

## 2023-12-27 DIAGNOSIS — E611 Iron deficiency: Secondary | ICD-10-CM | POA: Diagnosis not present

## 2023-12-27 DIAGNOSIS — E785 Hyperlipidemia, unspecified: Secondary | ICD-10-CM | POA: Diagnosis not present

## 2023-12-27 DIAGNOSIS — M8008XD Age-related osteoporosis with current pathological fracture, vertebra(e), subsequent encounter for fracture with routine healing: Secondary | ICD-10-CM | POA: Diagnosis not present

## 2023-12-29 ENCOUNTER — Other Ambulatory Visit (HOSPITAL_COMMUNITY)

## 2023-12-29 ENCOUNTER — Telehealth: Payer: Self-pay

## 2023-12-29 DIAGNOSIS — D72829 Elevated white blood cell count, unspecified: Secondary | ICD-10-CM | POA: Diagnosis not present

## 2023-12-29 DIAGNOSIS — G8929 Other chronic pain: Secondary | ICD-10-CM | POA: Diagnosis not present

## 2023-12-29 DIAGNOSIS — M47814 Spondylosis without myelopathy or radiculopathy, thoracic region: Secondary | ICD-10-CM | POA: Diagnosis not present

## 2023-12-29 DIAGNOSIS — M8008XD Age-related osteoporosis with current pathological fracture, vertebra(e), subsequent encounter for fracture with routine healing: Secondary | ICD-10-CM | POA: Diagnosis not present

## 2023-12-29 DIAGNOSIS — G8220 Paraplegia, unspecified: Secondary | ICD-10-CM | POA: Diagnosis not present

## 2023-12-29 DIAGNOSIS — M069 Rheumatoid arthritis, unspecified: Secondary | ICD-10-CM | POA: Diagnosis not present

## 2023-12-29 DIAGNOSIS — D631 Anemia in chronic kidney disease: Secondary | ICD-10-CM | POA: Diagnosis not present

## 2023-12-29 DIAGNOSIS — I420 Dilated cardiomyopathy: Secondary | ICD-10-CM | POA: Diagnosis not present

## 2023-12-29 DIAGNOSIS — N179 Acute kidney failure, unspecified: Secondary | ICD-10-CM | POA: Diagnosis not present

## 2023-12-29 DIAGNOSIS — D520 Dietary folate deficiency anemia: Secondary | ICD-10-CM | POA: Diagnosis not present

## 2023-12-29 DIAGNOSIS — C679 Malignant neoplasm of bladder, unspecified: Secondary | ICD-10-CM | POA: Diagnosis not present

## 2023-12-29 DIAGNOSIS — J9611 Chronic respiratory failure with hypoxia: Secondary | ICD-10-CM | POA: Diagnosis not present

## 2023-12-29 DIAGNOSIS — F5101 Primary insomnia: Secondary | ICD-10-CM | POA: Diagnosis not present

## 2023-12-29 DIAGNOSIS — M899 Disorder of bone, unspecified: Secondary | ICD-10-CM | POA: Diagnosis not present

## 2023-12-29 DIAGNOSIS — N1330 Unspecified hydronephrosis: Secondary | ICD-10-CM | POA: Diagnosis not present

## 2023-12-29 DIAGNOSIS — D630 Anemia in neoplastic disease: Secondary | ICD-10-CM | POA: Diagnosis not present

## 2023-12-29 DIAGNOSIS — I5042 Chronic combined systolic (congestive) and diastolic (congestive) heart failure: Secondary | ICD-10-CM | POA: Diagnosis not present

## 2023-12-29 DIAGNOSIS — E440 Moderate protein-calorie malnutrition: Secondary | ICD-10-CM | POA: Diagnosis not present

## 2023-12-29 DIAGNOSIS — E785 Hyperlipidemia, unspecified: Secondary | ICD-10-CM | POA: Diagnosis not present

## 2023-12-29 DIAGNOSIS — N1832 Chronic kidney disease, stage 3b: Secondary | ICD-10-CM | POA: Diagnosis not present

## 2023-12-29 DIAGNOSIS — J449 Chronic obstructive pulmonary disease, unspecified: Secondary | ICD-10-CM | POA: Diagnosis not present

## 2023-12-29 DIAGNOSIS — E611 Iron deficiency: Secondary | ICD-10-CM | POA: Diagnosis not present

## 2023-12-29 DIAGNOSIS — M4726 Other spondylosis with radiculopathy, lumbar region: Secondary | ICD-10-CM | POA: Diagnosis not present

## 2023-12-29 NOTE — Telephone Encounter (Signed)
 [facility] CSW Progress Note  Clinical Social Worker is continuing to assist patient and daughter with transportation coordination. CSW left vm for [facility] [employer] to assist with any concerns related to transport. Patient's daughter will be scheduling transportation for 5/06 appointments, and will alert CSW of any issues identified. Medical team updated.  Maudie Sorrow, LCSW Clinical Social Worker [facility]

## 2023-12-30 ENCOUNTER — Encounter: Payer: Self-pay | Admitting: Nurse Practitioner

## 2023-12-30 ENCOUNTER — Inpatient Hospital Stay (HOSPITAL_BASED_OUTPATIENT_CLINIC_OR_DEPARTMENT_OTHER)

## 2023-12-30 DIAGNOSIS — G893 Neoplasm related pain (acute) (chronic): Secondary | ICD-10-CM

## 2023-12-30 DIAGNOSIS — R530 Neoplastic (malignant) related fatigue: Secondary | ICD-10-CM

## 2023-12-30 DIAGNOSIS — C679 Malignant neoplasm of bladder, unspecified: Secondary | ICD-10-CM

## 2023-12-30 DIAGNOSIS — Z515 Encounter for palliative care: Secondary | ICD-10-CM

## 2023-12-30 NOTE — Progress Notes (Signed)
 Palliative Medicine [facility]  [phone]) [phone] Fax:([phone]) [phone]   Name: [name] Date: 12/30/2023 MRN: [id number]  DOB: 04/10/1956  Patient Care Team: Janece Means, FNP as PCP - General (Family Medicine) Lei Pump, MD as PCP - Electrophysiology (Cardiology) Manfred Seed, MD as PCP - Cardiology (Cardiology) Garry Kansas, MD as Consulting Physician (Neurosurgery) Manfred Seed, MD as Consulting Physician (Cardiology) Candyce Champagne, MD as Consulting Physician (General Surgery) Merriam Abbey, DO as Consulting Physician (Neurology) Steven Elam, [facility] (Inactive) as Pharmacist (Pharmacist) Genell Ken, MD as Consulting Physician (Gastroenterology) Zehr, Martina Sledge, PA-C as Physician Assistant (Gastroenterology) Cristi Donalds, MD as Consulting Physician (Nephrology) Pickenpack-Cousar, Giles Labrum, NP as Nurse Practitioner [facility] and Palliative Medicine)   I connected with NAKYLAH GRITTER on 12/30/23 at 11:30 AM EDT by telephone and verified that I am speaking with the correct person using two identifiers.   I discussed the limitations, risks, security and privacy concerns of performing an evaluation and management service by telemedicine and the availability of in-person appointments. I also discussed with the patient that there may be a patient responsible charge related to this service. The patient expressed understanding and agreed to proceed.   Other persons participating in the visit and their role in the encounter: [name] (daughter)   Patient's location: Home   Provider's location: [facility]   INTERVAL HISTORY: [name] is a 68 y.o. female with oncologic medical history including metastatic bladder cancer with T5 bone metastasis, COPD, chronic back pain from DDD, osteoporosis with fractures, chronic pain management at [facility] medical.  Palliative is seeing patient for symptom management and goals of care.   SOCIAL  HISTORY:     reports that she quit smoking about 3 years ago. Her smoking use included cigarettes. She started smoking about 33 years ago. She has a 15 pack-year smoking history. She has never used smokeless tobacco. She reports current alcohol use of about 2.0 standard drinks of alcohol per week. She reports that she does not use drugs.  ADVANCE DIRECTIVES:  None on file   CODE STATUS: Full code  PAST MEDICAL HISTORY: Past Medical History:  Diagnosis Date   Abdominal pain 10/17/2020   Abnormality of gait due to impairment of balance 11/22/2020   Acute diverticulitis 10/17/2020   Admission for long-term opiate analgesic use 10/24/2019   Arthritis of right hip 05/29/2016   Formatting of this note might be different from the original. Added automatically from request for surgery [id number]   BMI 26.0-26.9,adult 03/29/2020   Bronchial asthma    Cardiomyopathy (HCC)    Overview:  Ejection fraction 45% in 2015 Ejection fraction 30 to 35% in November 2018   Chronic back pain    Chronic constipation 07/31/2021   Chronic hypoxemic respiratory failure (HCC) 10/24/2019   Chronic narcotic use 03/23/2015   Chronic pain of both knees 12/21/2018   Added automatically from request for surgery [id number]  Formatting of this note might be different from the original. 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 06/19/2017   Dyslipidemia 05/31/2015   GERD (gastroesophageal reflux disease)    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HCC) 07/13/2020   Hypotension 05/17/2020   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   Ileus following gastrointestinal surgery (HCC) 03/26/2015   Major depressive disorder, single episode, moderate (HCC) 10/24/2019   Malnutrition of moderate degree (HCC) 10/20/2020   Mesenteric ischemia (HCC) 10/16/2020   Migraine without aura with status migrainosus 10/24/2019   Mixed incontinence 10/20/2020   Myocardial infarction (HCC)    Nausea & vomiting    Obstructive chronic bronchitis with exacerbation (HCC) 10/25/2019   Other spondylosis with radiculopathy, lumbar region 10/24/2019   Persistent vomiting 05/17/2020   Presence of left artificial hip joint 10/24/2019   PTSD (post-traumatic stress disorder)    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 03/23/2015 04/19/2014   Senile osteoporosis 10/24/2019   Sepsis (HCC) 10/17/2020   Serosanguineous chronic otitis media of right ear 08/02/2021   Small bowel obstruction (HCC)    Thyroid  disease    Upper respiratory tract infection due to COVID-19 virus 07/06/2021   Wellness examination 03/27/2021    ALLERGIES:  has no known allergies.  MEDICATIONS:  Current Outpatient Medications  Medication Sig Dispense Refill   ARIPiprazole  (ABILIFY ) 5 MG tablet TAKE 1 TABLET BY MOUTH DAILY 30 tablet 10   aspirin  81 MG tablet Take 81 mg by mouth daily.     BREO ELLIPTA  100-25 MCG/ACT AEPB INHALE 1 PUFF INTO THE LUNGS DAILY. (Patient taking differently: Inhale 1 puff into the lungs daily as needed (SOB).) 60 each 10   Carboxymethylcell-Glycerin  PF 0.5-0.9 % SOLN Place 2 drops into both eyes 4 (four) times daily. 1 each 11   carvedilol  (COREG ) 3.125 MG tablet TAKE ONE (1) TABLET BY MOUTH TWICE DAILY WITH MEALS (Patient taking differently: Take 3.125 mg by mouth 2 (two) times  daily with a meal.) 60 tablet 10   denosumab  (PROLIA ) 60 MG/ML SOSY injection Inject 60 mg into the skin every 6 (six) months. 180 mL 2   dextromethorphan  15 MG/5ML syrup Take 10 mLs (30 mg total) by mouth 4 (four) times daily as needed for cough. 120 mL 0   diclofenac  Sodium (VOLTAREN ) 1 % GEL Apply 2 g topically 4 (four) times daily. (Patient taking differently: Apply 2 g topically as needed (Pain).) 50 g 4   dronabinol  (MARINOL ) 5 MG capsule Take 1 capsule (5 mg total) by mouth 2 (two) times daily before a meal. 60 capsule 0   ENTRESTO  24-26 MG TAKE ONE (1) TABLET BY MOUTH TWICE DAILY (Patient taking differently: Take 1 tablet by mouth 2 (two) times daily.) 60 tablet 10   EPINEPHRINE  0.3 mg/0.3 mL IJ SOAJ injection Inject 0.3 mLs (0.3 mg total) into the muscle as needed for anaphylaxis. 1 each 2   esomeprazole  (NEXIUM ) 20 MG capsule TAKE ONE (1) CAPSULE BY MOUTH ONCE DAILY (Patient taking differently: Take 20 mg by mouth daily at 12 noon.) 90 capsule 1   FARXIGA 5 MG TABS tablet Take 5 mg by mouth daily.     ferrous sulfate  325 (65 FE) MG tablet Take 1 tablet (325 mg total) by mouth at bedtime.     fluticasone  (FLONASE ) 50 MCG/ACT nasal spray USE 2 SPRAYS IN EACH NOSTRILS DAILY AS NEEDED (Patient taking differently: Place 2 sprays into both nostrils daily as needed for allergies or rhinitis.)  48 g 0   folic acid  (FOLVITE ) 1 MG tablet Take 1 tablet (1 mg total) by mouth daily.     ipratropium-albuterol  (DUONEB) 0.5-2.5 (3) MG/3ML SOLN USE 1 VIAL VIA NEBULIZER EVERY 6 HOURS AS NEEDED FOR SHORTNESS OF BREATH (Patient taking differently: Take 3 mLs by nebulization every 6 (six) hours as needed (SOB).) 90 mL 0   levocetirizine (XYZAL) 5 MG tablet Take 5 mg by mouth daily as needed for allergies.     lidocaine  (LIDODERM ) 5 % Place 1 patch onto the skin daily. Remove & Discard patch within 12 hours or as directed by MD     lidocaine -prilocaine  (EMLA ) cream Apply to affected area once (Patient taking  differently: Apply 1 Application topically once. Apply to affected area once) 30 g 3   lubiprostone  (AMITIZA ) 24 MCG capsule TAKE 1 CAPSULE(24 MCG) BY MOUTH TWICE DAILY WITH A MEAL (Patient taking differently: Take 24 mcg by mouth 2 (two) times daily with a meal.) 180 capsule 1   methadone  (DOLOPHINE ) 5 MG tablet Take 1 tablet (5 mg total) by mouth every 8 (eight) hours. 45 tablet 0   methylPREDNISolone  (MEDROL  DOSEPAK) 4 MG TBPK tablet If significant rash, will start at 6 tabs on day 1, then 5 tabs on day 2, then 4 tabs on day 3, then 3 tabs on day 4, then 2 tabs on day 5 and then 1 tab on day 6. Take with food. 21 tablet 0   Multiple Vitamin (MULTIVITAMIN WITH MINERALS) TABS tablet Take 1 tablet by mouth daily.     naloxone  (NARCAN ) nasal spray 4 mg/0.1 mL Place 1 spray into the nose once.     nitroGLYCERIN  (NITROSTAT ) 0.4 MG SL tablet Place 1 tablet (0.4 mg total) under the tongue every 5 (five) minutes as needed for chest pain. 25 tablet 6   ondansetron  (ZOFRAN ) 4 MG tablet Take 1 tablet (4 mg total) by mouth every 8 (eight) hours as needed for nausea or vomiting. 20 tablet 0   ondansetron  (ZOFRAN ) 8 MG tablet Take 1 tablet (8 mg total) by mouth every 8 (eight) hours as needed for nausea or vomiting. 30 tablet 1   oxyCODONE  (ROXICODONE ) 15 MG immediate release tablet Take 1 tablet (15 mg total) by mouth every 6 (six) hours as needed for pain. 90 tablet 0   prochlorperazine  (COMPAZINE ) 10 MG tablet TAKE 1 TABLET(10 MG) BY MOUTH EVERY 6 HOURS AS NEEDED FOR NAUSEA OR VOMITING 30 tablet 1   ranolazine  (RANEXA ) 500 MG 12 hr tablet TAKE 1 TABLET BY MOUTH TWICE DAILY 60 tablet 10   rosuvastatin  (CRESTOR ) 20 MG tablet TAKE 1 TABLET BY MOUTH ONCE DAILY 90 tablet 1   sertraline  (ZOLOFT ) 100 MG tablet TAKE 1/2 TABLET(50 MG) BY MOUTH DAILY (Patient taking differently: Take 50 mg by mouth daily.) 45 tablet 1   tiZANidine  (ZANAFLEX ) 4 MG tablet Take 1 tablet (4 mg total) by mouth at bedtime. 30 tablet 3    topiramate  (TOPAMAX ) 50 MG tablet TAKE TWO (2) TABLETS BY MOUTH EVERY DAY AT BEDTIME (Patient taking differently: Take 50 mg by mouth at bedtime. TAKE TWO (2) TABLETS BY MOUTH EVERY DAY AT BEDTIME) 60 tablet 10   Ubrogepant  (UBRELVY ) 100 MG TABS Take 1 tablet (100 mg total) by mouth as needed (May repeat after 2 hours.  Maximum 2 tablets in 24 hours). TAKE 1 TABLET BY MOUTH BY MOUTH AS NEEDED( MAY REPEAT 1 TABLET AFTER 2 HOURS IF NEEDED, MAXIMUM 2 TABLETS IN 24 HOURS) Strength:  100 mg 48 tablet 1   Vitamin D , Ergocalciferol , (DRISDOL ) 1.25 MG (50000 UNIT) CAPS capsule TAKE 1 CAPSULE BY MOUTH EVERY 7 DAYS FOR 12 DOSES (Patient taking differently: Take 50,000 Units by mouth every 7 (seven) days. Patient usually takes this medication on mondays) 12 capsule 0   No current facility-administered medications for this visit.    VITAL SIGNS: There were no vitals taken for this visit. There were no vitals filed for this visit.  Estimated body mass index is 29.73 kg/m as calculated from the following:   Height as of 12/24/23: 4\' 10"  (1.473 m).   Weight as of 12/25/23: 142 lb 4 oz (64.5 kg).   PERFORMANCE STATUS (ECOG) : 3 - Symptomatic, >50% confined to bed  IMPRESSION: Discussed the use of AI scribe software for clinical note transcription with the patient, who gave verbal consent to proceed.  History of Present Illness [name] is a 68 year old female with chronic pain and recent diagnosis of metastatic bladder cancer with T5 bone involvement who I connected with by phone. No acute distress noted. Her daughter [name] also present during call.    She experiences diarrhea, described as 'the runs,' occurring twice daily since Friday, which she associates with either the methadone  or her recent chemotherapy treatment. She reports several episodes of  nausea and vomiting over the weekend with last episode on yesterday morning. Confirmed antiemetics in the home. Education provided on not taking most  medications, specifically her pain medication on an empty stomach.Patient and daughter verbalized understanding. For nausea, she has been using prochlorperazine  but switched to ondansetron  (Zofran ) due to the side effects of diarrhea after taking doses of prochlorperazine . She has not used the ondansetron  yet as her nausea subsided since yesterday morning.  We discussed Ms. Wandrey's pain regimen and control at length.  Over the weekend she had some difficulty obtaining oxycodone  with pharmacy however this was resolved and she was able to pick up.  She shares that current pain management regimen is not effectively controlling her pain. She has been transitioned to methadone , taken every eight hours, but it does not alleviate her pain, which remains at a level of ten. She is also taking oxycodone  without relief. Previously, she was on Belbuca , which reduced her pain to a level of one or two.  Education provided on the goal of pain control with understanding the focuses on getting pain to a manageable state.  Patient will continue to take oxycodone  as needed every 6 hours.  No reported adverse effects and tolerating methadone  without difficulty.  Have instructed patient to increase methadone  to 10 mg every 8 hours for better pain control.  She and daughter verbalized understanding.  Tizanidine  as needed for muscle spasticity.  We will continue to closely monitor and support.   I discussed the importance of continued conversation with family and their medical providers regarding overall plan of care and treatment options, ensuring decisions are within the context of the patients values and GOCs. Assessment & Plan Pain management with methadone  and oxycodone  Chronic pain management is suboptimal with methadone . Pain remains at 10/10. Belbuca  previously reduced pain to 1-2/10. Methadone  is ineffective at current dosing. - Increase methadone  to 10 mg every 8 hours starting with the next dose. - Continue oxycodone   15 mg every 6 hours as needed for breakthrough pain. - Educated on taking methadone  with food to reduce nausea and vomiting.  Nausea and vomiting possibly related to chemotherapy Nausea and vomiting began after chemotherapy.  Methadone  may contribute, especially on an empty stomach. Reduced food intake may exacerbate symptoms. - Encourage small, frequent meals. - Use Zofran  (ondansetron ) as needed. - Educated on taking methadone  with food to reduce nausea and vomiting.  Diarrhea possibly related to chemotherapy Diarrhea started after chemotherapy, occurring twice daily for three days.  - Consider Imodium  if diarrhea persists, but avoid overuse to prevent constipation.  I will plan to follow-up with patient in 1 week for close symptom management follow-up.  She knows to contact office sooner as needed.  Patient expressed understanding and was in agreement with this plan. She also understands that She can call the clinic at any time with any questions, concerns, or complaints.   Any controlled substances utilized were prescribed in the context of palliative care. PDMP has been reviewed.   Visit consisted of counseling and education dealing with the complex and emotionally intense issues of symptom management and palliative care in the setting of serious and potentially life-threatening illness.  Dellia Ferguson, AGPCNP-BC  Palliative Medicine Team/[facility]

## 2023-12-31 ENCOUNTER — Inpatient Hospital Stay (HOSPITAL_COMMUNITY): Admission: RE | Admit: 2023-12-31 | Source: Ambulatory Visit

## 2023-12-31 ENCOUNTER — Other Ambulatory Visit: Payer: Self-pay | Admitting: Nurse Practitioner

## 2023-12-31 DIAGNOSIS — C679 Malignant neoplasm of bladder, unspecified: Secondary | ICD-10-CM

## 2023-12-31 DIAGNOSIS — R197 Diarrhea, unspecified: Secondary | ICD-10-CM

## 2023-12-31 MED ORDER — DIPHENOXYLATE-ATROPINE 2.5-0.025 MG PO TABS: 1.0000 | ORAL_TABLET | Freq: Four times a day (QID) | ORAL | 0 refills | Status: DC | PRN

## 2024-01-01 ENCOUNTER — Telehealth: Payer: Self-pay

## 2024-01-01 ENCOUNTER — Other Ambulatory Visit: Payer: Self-pay

## 2024-01-01 DIAGNOSIS — M8008XD Age-related osteoporosis with current pathological fracture, vertebra(e), subsequent encounter for fracture with routine healing: Secondary | ICD-10-CM | POA: Diagnosis not present

## 2024-01-01 DIAGNOSIS — E785 Hyperlipidemia, unspecified: Secondary | ICD-10-CM | POA: Diagnosis not present

## 2024-01-01 DIAGNOSIS — G8929 Other chronic pain: Secondary | ICD-10-CM | POA: Diagnosis not present

## 2024-01-01 DIAGNOSIS — J9611 Chronic respiratory failure with hypoxia: Secondary | ICD-10-CM | POA: Diagnosis not present

## 2024-01-01 DIAGNOSIS — E440 Moderate protein-calorie malnutrition: Secondary | ICD-10-CM | POA: Diagnosis not present

## 2024-01-01 DIAGNOSIS — M069 Rheumatoid arthritis, unspecified: Secondary | ICD-10-CM | POA: Diagnosis not present

## 2024-01-01 DIAGNOSIS — M47814 Spondylosis without myelopathy or radiculopathy, thoracic region: Secondary | ICD-10-CM | POA: Diagnosis not present

## 2024-01-01 DIAGNOSIS — D631 Anemia in chronic kidney disease: Secondary | ICD-10-CM | POA: Diagnosis not present

## 2024-01-01 DIAGNOSIS — F5101 Primary insomnia: Secondary | ICD-10-CM | POA: Diagnosis not present

## 2024-01-01 DIAGNOSIS — J449 Chronic obstructive pulmonary disease, unspecified: Secondary | ICD-10-CM | POA: Diagnosis not present

## 2024-01-01 DIAGNOSIS — G8220 Paraplegia, unspecified: Secondary | ICD-10-CM | POA: Diagnosis not present

## 2024-01-01 DIAGNOSIS — I420 Dilated cardiomyopathy: Secondary | ICD-10-CM | POA: Diagnosis not present

## 2024-01-01 DIAGNOSIS — N179 Acute kidney failure, unspecified: Secondary | ICD-10-CM | POA: Diagnosis not present

## 2024-01-01 DIAGNOSIS — E611 Iron deficiency: Secondary | ICD-10-CM | POA: Diagnosis not present

## 2024-01-01 DIAGNOSIS — M899 Disorder of bone, unspecified: Secondary | ICD-10-CM | POA: Diagnosis not present

## 2024-01-01 DIAGNOSIS — C679 Malignant neoplasm of bladder, unspecified: Secondary | ICD-10-CM | POA: Diagnosis not present

## 2024-01-01 DIAGNOSIS — N1330 Unspecified hydronephrosis: Secondary | ICD-10-CM | POA: Diagnosis not present

## 2024-01-01 DIAGNOSIS — N1832 Chronic kidney disease, stage 3b: Secondary | ICD-10-CM | POA: Diagnosis not present

## 2024-01-01 DIAGNOSIS — D630 Anemia in neoplastic disease: Secondary | ICD-10-CM | POA: Diagnosis not present

## 2024-01-01 DIAGNOSIS — D520 Dietary folate deficiency anemia: Secondary | ICD-10-CM | POA: Diagnosis not present

## 2024-01-01 DIAGNOSIS — M4726 Other spondylosis with radiculopathy, lumbar region: Secondary | ICD-10-CM | POA: Diagnosis not present

## 2024-01-01 DIAGNOSIS — D72829 Elevated white blood cell count, unspecified: Secondary | ICD-10-CM | POA: Diagnosis not present

## 2024-01-01 DIAGNOSIS — I5042 Chronic combined systolic (congestive) and diastolic (congestive) heart failure: Secondary | ICD-10-CM | POA: Diagnosis not present

## 2024-01-01 NOTE — Telephone Encounter (Signed)
 [facility] CSW Progress Note  Clinical Social Worker contacted patient's daughter by phone for updates on transportation. Patient's daughter reported appointments had to be cancelled due to patient not feeling well. Patient's daughter will keep CSW updated.   Maudie Sorrow, LCSW Clinical Social Worker [facility]

## 2024-01-02 DIAGNOSIS — E785 Hyperlipidemia, unspecified: Secondary | ICD-10-CM | POA: Diagnosis not present

## 2024-01-02 DIAGNOSIS — N1330 Unspecified hydronephrosis: Secondary | ICD-10-CM | POA: Diagnosis not present

## 2024-01-02 DIAGNOSIS — D520 Dietary folate deficiency anemia: Secondary | ICD-10-CM | POA: Diagnosis not present

## 2024-01-02 DIAGNOSIS — M47814 Spondylosis without myelopathy or radiculopathy, thoracic region: Secondary | ICD-10-CM | POA: Diagnosis not present

## 2024-01-02 DIAGNOSIS — D631 Anemia in chronic kidney disease: Secondary | ICD-10-CM | POA: Diagnosis not present

## 2024-01-02 DIAGNOSIS — M8008XD Age-related osteoporosis with current pathological fracture, vertebra(e), subsequent encounter for fracture with routine healing: Secondary | ICD-10-CM | POA: Diagnosis not present

## 2024-01-02 DIAGNOSIS — C679 Malignant neoplasm of bladder, unspecified: Secondary | ICD-10-CM | POA: Diagnosis not present

## 2024-01-02 DIAGNOSIS — D72829 Elevated white blood cell count, unspecified: Secondary | ICD-10-CM | POA: Diagnosis not present

## 2024-01-02 DIAGNOSIS — N179 Acute kidney failure, unspecified: Secondary | ICD-10-CM | POA: Diagnosis not present

## 2024-01-02 DIAGNOSIS — J449 Chronic obstructive pulmonary disease, unspecified: Secondary | ICD-10-CM | POA: Diagnosis not present

## 2024-01-02 DIAGNOSIS — N1832 Chronic kidney disease, stage 3b: Secondary | ICD-10-CM | POA: Diagnosis not present

## 2024-01-02 DIAGNOSIS — E611 Iron deficiency: Secondary | ICD-10-CM | POA: Diagnosis not present

## 2024-01-02 DIAGNOSIS — M069 Rheumatoid arthritis, unspecified: Secondary | ICD-10-CM | POA: Diagnosis not present

## 2024-01-02 DIAGNOSIS — G8929 Other chronic pain: Secondary | ICD-10-CM | POA: Diagnosis not present

## 2024-01-02 DIAGNOSIS — M4726 Other spondylosis with radiculopathy, lumbar region: Secondary | ICD-10-CM | POA: Diagnosis not present

## 2024-01-02 DIAGNOSIS — J9611 Chronic respiratory failure with hypoxia: Secondary | ICD-10-CM | POA: Diagnosis not present

## 2024-01-02 DIAGNOSIS — D630 Anemia in neoplastic disease: Secondary | ICD-10-CM | POA: Diagnosis not present

## 2024-01-02 DIAGNOSIS — E440 Moderate protein-calorie malnutrition: Secondary | ICD-10-CM | POA: Diagnosis not present

## 2024-01-02 DIAGNOSIS — G8220 Paraplegia, unspecified: Secondary | ICD-10-CM | POA: Diagnosis not present

## 2024-01-02 DIAGNOSIS — I420 Dilated cardiomyopathy: Secondary | ICD-10-CM | POA: Diagnosis not present

## 2024-01-02 DIAGNOSIS — M899 Disorder of bone, unspecified: Secondary | ICD-10-CM | POA: Diagnosis not present

## 2024-01-02 DIAGNOSIS — I5042 Chronic combined systolic (congestive) and diastolic (congestive) heart failure: Secondary | ICD-10-CM | POA: Diagnosis not present

## 2024-01-02 DIAGNOSIS — F5101 Primary insomnia: Secondary | ICD-10-CM | POA: Diagnosis not present

## 2024-01-05 ENCOUNTER — Other Ambulatory Visit (HOSPITAL_BASED_OUTPATIENT_CLINIC_OR_DEPARTMENT_OTHER): Payer: Self-pay | Admitting: Nurse Practitioner

## 2024-01-05 DIAGNOSIS — C679 Malignant neoplasm of bladder, unspecified: Secondary | ICD-10-CM

## 2024-01-05 DIAGNOSIS — M5489 Other dorsalgia: Secondary | ICD-10-CM | POA: Diagnosis not present

## 2024-01-05 DIAGNOSIS — Z515 Encounter for palliative care: Secondary | ICD-10-CM | POA: Diagnosis not present

## 2024-01-05 DIAGNOSIS — M792 Neuralgia and neuritis, unspecified: Secondary | ICD-10-CM | POA: Diagnosis not present

## 2024-01-05 DIAGNOSIS — G893 Neoplasm related pain (acute) (chronic): Secondary | ICD-10-CM

## 2024-01-05 MED ORDER — PREGABALIN 50 MG PO CAPS
50.0000 mg | ORAL_CAPSULE | Freq: Two times a day (BID) | ORAL | 2 refills | Status: DC
Start: 2024-01-05 — End: 2024-01-15

## 2024-01-05 MED ORDER — METHADONE HCL 10 MG PO TABS: 10.0000 mg | ORAL_TABLET | Freq: Three times a day (TID) | ORAL | 0 refills | Status: DC

## 2024-01-05 MED ORDER — OXYCODONE HCL 15 MG PO TABS: 15.0000 mg | ORAL_TABLET | ORAL | 0 refills | Status: DC | PRN

## 2024-01-05 NOTE — Telephone Encounter (Signed)
 I connected with ALEEYAH NIEHAUS on 01/05/24 at 1155 am by telephone and verified that I am speaking with the correct person using two identifiers.   I discussed the limitations, risks, security and privacy concerns of performing an evaluation and management service by telemedicine and the availability of in-person appointments. I also discussed with the patient that there may be a patient responsible charge related to this service. The patient expressed understanding and agreed to proceed.   Other persons participating in the visit and their role in the encounter: [name] (Daughter)   Patient's location: Home   Provider's location: [facility]   Chief Complaint: Symptom Management   I connected by phone with patient and her daughter for symptom management follow-up.  We discussed Ms. Rease's pain at length.  Patient has been complaining of lower back pain in addition to pelvic/bladder pain.  Pain not well controlled over the past several weeks however daughter states some noticeable improvement since we recently adjusted methadone  up to 10mg  every 8 hours. Patient is tolerating without difficulty.   Aaila has been taking oxycodone  15 mg every 3-4 hours as needed for pain.  Daughter states patient has been taking regularly as she complains of more pelvic/bladder pain.  Education provided on use of opioids and taking as prescribed.  Extensive discussion on self-medicating and following prescribers instructions including frequency.  Patient previously on gabapentin  800 mg 3 times daily from previous pain clinic.  States she self discontinued as medication made her feel drowsy and at the time when she was able to ambulate independently some steady gait.  No dose adjustment noted.  I advised patient given type of pain that she is currently complaining of consideration for gabapentin  or Lyrica for her neuropathic and visceral type pain would be most appropriate compared to oxycodone .  Daughter verbalized  understanding.  Education provided on use of Lyrica including efficacy, administration, potential side effects.  Will start patient on Lyrica 50 mg twice daily.  I reeducated patient and daughter on refill policy, protocol to continue with pain management under palliative, and the importance of notifying medical team when symptoms are uncontrolled versus self managing.  They are aware refills will not be provided if not appropriate timing based on prescribed usage.  All questions answered and support provided.  I will plan to follow-up with patient later this week for close symptom management follow-up.  Plan -Continue methadone  10 mg every 8 hours - Oxycodone  15 mg every 4-6 hours as needed for breakthrough and severe pain. -Lyrica 50 mg twice daily. -We will continue to closely monitor symptoms closely. -Patient has a scheduled appointment later this week.  Any controlled substances utilized were prescribed in the context of palliative care. PDMP has been reviewed.    I provided 45 minutes of non face-to-face telephone visit time during this encounter, and > 50% was spent counseling as documented under my assessment & plan. Visit consisted of counseling and education dealing with the complex and emotionally intense issues of symptom management and palliative care in the setting of serious and potentially life-threatening illness.  Dellia Ferguson, AGPCNP-BC  Palliative Medicine Team/[facility]

## 2024-01-06 DIAGNOSIS — E440 Moderate protein-calorie malnutrition: Secondary | ICD-10-CM | POA: Diagnosis not present

## 2024-01-06 DIAGNOSIS — J449 Chronic obstructive pulmonary disease, unspecified: Secondary | ICD-10-CM | POA: Diagnosis not present

## 2024-01-06 DIAGNOSIS — I5042 Chronic combined systolic (congestive) and diastolic (congestive) heart failure: Secondary | ICD-10-CM | POA: Diagnosis not present

## 2024-01-06 DIAGNOSIS — D72829 Elevated white blood cell count, unspecified: Secondary | ICD-10-CM | POA: Diagnosis not present

## 2024-01-06 DIAGNOSIS — D631 Anemia in chronic kidney disease: Secondary | ICD-10-CM | POA: Diagnosis not present

## 2024-01-06 DIAGNOSIS — F5101 Primary insomnia: Secondary | ICD-10-CM | POA: Diagnosis not present

## 2024-01-06 DIAGNOSIS — N1832 Chronic kidney disease, stage 3b: Secondary | ICD-10-CM | POA: Diagnosis not present

## 2024-01-06 DIAGNOSIS — E611 Iron deficiency: Secondary | ICD-10-CM | POA: Diagnosis not present

## 2024-01-06 DIAGNOSIS — G8220 Paraplegia, unspecified: Secondary | ICD-10-CM | POA: Diagnosis not present

## 2024-01-06 DIAGNOSIS — N1330 Unspecified hydronephrosis: Secondary | ICD-10-CM | POA: Diagnosis not present

## 2024-01-06 DIAGNOSIS — I420 Dilated cardiomyopathy: Secondary | ICD-10-CM | POA: Diagnosis not present

## 2024-01-06 DIAGNOSIS — C679 Malignant neoplasm of bladder, unspecified: Secondary | ICD-10-CM | POA: Diagnosis not present

## 2024-01-06 DIAGNOSIS — D520 Dietary folate deficiency anemia: Secondary | ICD-10-CM | POA: Diagnosis not present

## 2024-01-06 DIAGNOSIS — M8008XD Age-related osteoporosis with current pathological fracture, vertebra(e), subsequent encounter for fracture with routine healing: Secondary | ICD-10-CM | POA: Diagnosis not present

## 2024-01-06 DIAGNOSIS — E785 Hyperlipidemia, unspecified: Secondary | ICD-10-CM | POA: Diagnosis not present

## 2024-01-06 DIAGNOSIS — M4726 Other spondylosis with radiculopathy, lumbar region: Secondary | ICD-10-CM | POA: Diagnosis not present

## 2024-01-06 DIAGNOSIS — M899 Disorder of bone, unspecified: Secondary | ICD-10-CM | POA: Diagnosis not present

## 2024-01-06 DIAGNOSIS — D630 Anemia in neoplastic disease: Secondary | ICD-10-CM | POA: Diagnosis not present

## 2024-01-06 DIAGNOSIS — M069 Rheumatoid arthritis, unspecified: Secondary | ICD-10-CM | POA: Diagnosis not present

## 2024-01-06 DIAGNOSIS — N179 Acute kidney failure, unspecified: Secondary | ICD-10-CM | POA: Diagnosis not present

## 2024-01-06 DIAGNOSIS — M47814 Spondylosis without myelopathy or radiculopathy, thoracic region: Secondary | ICD-10-CM | POA: Diagnosis not present

## 2024-01-06 DIAGNOSIS — G8929 Other chronic pain: Secondary | ICD-10-CM | POA: Diagnosis not present

## 2024-01-06 DIAGNOSIS — J9611 Chronic respiratory failure with hypoxia: Secondary | ICD-10-CM | POA: Diagnosis not present

## 2024-01-07 DIAGNOSIS — E785 Hyperlipidemia, unspecified: Secondary | ICD-10-CM | POA: Diagnosis not present

## 2024-01-07 DIAGNOSIS — M8008XD Age-related osteoporosis with current pathological fracture, vertebra(e), subsequent encounter for fracture with routine healing: Secondary | ICD-10-CM | POA: Diagnosis not present

## 2024-01-07 DIAGNOSIS — M069 Rheumatoid arthritis, unspecified: Secondary | ICD-10-CM | POA: Diagnosis not present

## 2024-01-07 DIAGNOSIS — E611 Iron deficiency: Secondary | ICD-10-CM | POA: Diagnosis not present

## 2024-01-07 DIAGNOSIS — M899 Disorder of bone, unspecified: Secondary | ICD-10-CM | POA: Diagnosis not present

## 2024-01-07 DIAGNOSIS — N1330 Unspecified hydronephrosis: Secondary | ICD-10-CM | POA: Diagnosis not present

## 2024-01-07 DIAGNOSIS — D520 Dietary folate deficiency anemia: Secondary | ICD-10-CM | POA: Diagnosis not present

## 2024-01-07 DIAGNOSIS — F5101 Primary insomnia: Secondary | ICD-10-CM | POA: Diagnosis not present

## 2024-01-07 DIAGNOSIS — N1832 Chronic kidney disease, stage 3b: Secondary | ICD-10-CM | POA: Diagnosis not present

## 2024-01-07 DIAGNOSIS — D72829 Elevated white blood cell count, unspecified: Secondary | ICD-10-CM | POA: Diagnosis not present

## 2024-01-07 DIAGNOSIS — D630 Anemia in neoplastic disease: Secondary | ICD-10-CM | POA: Diagnosis not present

## 2024-01-07 DIAGNOSIS — I5042 Chronic combined systolic (congestive) and diastolic (congestive) heart failure: Secondary | ICD-10-CM | POA: Diagnosis not present

## 2024-01-07 DIAGNOSIS — I420 Dilated cardiomyopathy: Secondary | ICD-10-CM | POA: Diagnosis not present

## 2024-01-07 DIAGNOSIS — D631 Anemia in chronic kidney disease: Secondary | ICD-10-CM | POA: Diagnosis not present

## 2024-01-07 DIAGNOSIS — C679 Malignant neoplasm of bladder, unspecified: Secondary | ICD-10-CM | POA: Diagnosis not present

## 2024-01-07 DIAGNOSIS — E440 Moderate protein-calorie malnutrition: Secondary | ICD-10-CM | POA: Diagnosis not present

## 2024-01-07 DIAGNOSIS — J9611 Chronic respiratory failure with hypoxia: Secondary | ICD-10-CM | POA: Diagnosis not present

## 2024-01-07 DIAGNOSIS — G8929 Other chronic pain: Secondary | ICD-10-CM | POA: Diagnosis not present

## 2024-01-07 DIAGNOSIS — M47814 Spondylosis without myelopathy or radiculopathy, thoracic region: Secondary | ICD-10-CM | POA: Diagnosis not present

## 2024-01-07 DIAGNOSIS — G8220 Paraplegia, unspecified: Secondary | ICD-10-CM | POA: Diagnosis not present

## 2024-01-07 DIAGNOSIS — M4726 Other spondylosis with radiculopathy, lumbar region: Secondary | ICD-10-CM | POA: Diagnosis not present

## 2024-01-07 DIAGNOSIS — J449 Chronic obstructive pulmonary disease, unspecified: Secondary | ICD-10-CM | POA: Diagnosis not present

## 2024-01-07 DIAGNOSIS — N179 Acute kidney failure, unspecified: Secondary | ICD-10-CM | POA: Diagnosis not present

## 2024-01-07 MED ORDER — METOCLOPRAMIDE HCL 10 MG PO TABS: 10.0000 mg | ORAL_TABLET | Freq: Three times a day (TID) | ORAL | 1 refills | Status: DC | PRN

## 2024-01-07 NOTE — Progress Notes (Unsigned)
 [facility] OFFICE PROGRESS NOTE  Patient Care Team: Janece Means, FNP as PCP - General (Family Medicine) Lei Pump, MD as PCP - Electrophysiology (Cardiology) Manfred Seed, MD as PCP - Cardiology (Cardiology) Garry Kansas, MD as Consulting Physician (Neurosurgery) Manfred Seed, MD as Consulting Physician (Cardiology) Candyce Champagne, MD as Consulting Physician (General Surgery) Merriam Abbey, DO as Consulting Physician (Neurology) Steven Elam, [employer] (Inactive) as Pharmacist (Pharmacist) Genell Ken, MD as Consulting Physician (Gastroenterology) Zehr, Martina Sledge, PA-C as Physician Assistant (Gastroenterology) Cristi Donalds, MD as Consulting Physician (Nephrology) Pickenpack-Cousar, Giles Labrum, NP as Nurse Practitioner (Hospice and Palliative Medicine)  68 y.o.female with past medical history of COPD, chronic back from from DDD, osteoporosis with fractures presenting with back pain radiating down to the hip in March of 2025 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  Cystoscopy with TURBT show right sided bladder tumor with palpable anterior vaginal mass.  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    Current diagnosis: Stage IVB UC with bone metastases Treatment:  12/18/23 C1D1 EV/P  Clinically stable after C1. Discussed continue treatment today. Manage side effects as needed. Diarrhea without signs of infection.  Resend her Marinol  to another pharmacy.   Baseline edema unchanged after holding diuretics. Lung sounds clear and O2 100% today. Assessment & Plan Urothelial carcinoma of bladder (HCC) continue EV/P today Seeing palliative care today Follow up before C2D8  Folate deficiency Take folic acid  1 mg daily Decreased appetite Resend her Marinol  to another pharmacy.   Low vitamin B12 level B12 1000 mcg daily Iron deficiency Ferrous sulfate  325 mg daily Stage  3a chronic kidney disease (HCC) Repeat lab before each treatment Increase fluid at home Diarrhea, unspecified type Imodium  as needed. Call if no improvement or worsening.    Lowanda Ruddy, MD  INTERVAL HISTORY: Patient returns for follow-up. Report of nausea and taking anti-emetics. Report nothing coming out. She is drinking more and not dark brown any. Appetite has not improved.   No coughing, rash.  Diarrhea about every other day, 2-3 x a day. Landa Pine was management diarrhea. No paresthesia.   Report neck pain has improved. Lower back pain about the same.  Oncology History  Urothelial carcinoma of bladder (HCC)  11/18/2023 Initial Diagnosis   Urothelial carcinoma of bladder (HCC)   12/01/2023 Cancer Staging   Staging form: Urinary Bladder, AJCC 8th Edition - Clinical: Stage IVB (cTX, cNX, pM1b) - Signed by Lowanda Ruddy, MD on 12/01/2023 WHO/ISUP grade (low/high): High Grade Histologic grading system: 2 grade system   12/18/2023 -  Chemotherapy   Patient is on Treatment Plan : UROTHELIAL ADVANCED, METASTATIC ENFORTUMAB D1, D8 + PEMBROLIZUMAB  (200) D1 Q21D      Past Medical History:  Diagnosis Date   Abdominal pain 10/17/2020   Abnormality of gait due to impairment of balance 11/22/2020   Acute diverticulitis 10/17/2020   Admission for long-term opiate analgesic use 10/24/2019   Arthritis of right hip 05/29/2016   Formatting of this note might be different from the original. Added automatically from request for surgery [id number]   BMI 26.0-26.9,adult 03/29/2020   Bronchial asthma    Cardiomyopathy (HCC)    Overview:  Ejection fraction 45% in 2015 Ejection fraction 30 to 35% in November 2018   Chronic back pain    Chronic constipation 07/31/2021   Chronic hypoxemic respiratory failure (HCC) 10/24/2019   Chronic narcotic use 03/23/2015   Chronic  pain of both knees 12/21/2018   Added automatically from request for surgery [id number]  Formatting of this note might be different  from the original. 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   Hypotension 05/17/2020   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   Ileus following gastrointestinal surgery (HCC) 03/26/2015   Major depressive disorder, single episode, moderate (HCC) 10/24/2019   Malnutrition of moderate degree (HCC) 10/20/2020   Mesenteric ischemia (HCC) 10/16/2020   Migraine without aura with status migrainosus 10/24/2019   Mixed incontinence 10/20/2020   Myocardial infarction (HCC)    Nausea & vomiting    Obstructive chronic bronchitis with exacerbation (HCC) 10/25/2019   Other spondylosis with radiculopathy, lumbar region 10/24/2019   Persistent vomiting 05/17/2020   Presence of left artificial hip joint 10/24/2019   PTSD (post-traumatic stress disorder)    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 03/23/2015 04/19/2014   Senile osteoporosis 10/24/2019   Sepsis (HCC) 10/17/2020   Serosanguineous chronic otitis media of right ear 08/02/2021   Small bowel obstruction (HCC)    Thyroid  disease    Upper respiratory tract infection due to COVID-19 virus 07/06/2021   Wellness examination 03/27/2021     PHYSICAL  EXAMINATION: ECOG PERFORMANCE STATUS: 2 - Symptomatic, <50% confined to bed  Vitals:   01/08/24 1306  BP: (!) 148/98  Pulse: (!) 109  Resp: 16  Temp: (!) 97.2 F (36.2 C)  SpO2: 100%   There were no vitals filed for this visit.  GENERAL: alert, no distress and comfortable SKIN: skin color pale, no rash on exposed skin EYES: sclera clear OROPHARYNX: dry LUNGS: clear to auscultation and percussion with normal breathing effort HEART: regular rate & rhythm  ABDOMEN: abdomen soft, non-tender and nondistended. Musculoskeletal: bilateral pitting edema   Relevant data reviewed during this visit included labs.

## 2024-01-08 ENCOUNTER — Inpatient Hospital Stay

## 2024-01-08 ENCOUNTER — Inpatient Hospital Stay (HOSPITAL_BASED_OUTPATIENT_CLINIC_OR_DEPARTMENT_OTHER)

## 2024-01-08 ENCOUNTER — Encounter: Payer: Self-pay | Admitting: Nurse Practitioner

## 2024-01-08 ENCOUNTER — Inpatient Hospital Stay (HOSPITAL_BASED_OUTPATIENT_CLINIC_OR_DEPARTMENT_OTHER): Admitting: Nurse Practitioner

## 2024-01-08 ENCOUNTER — Ambulatory Visit

## 2024-01-08 VITALS — HR 99

## 2024-01-08 VITALS — BP 148/98 | HR 109 | Temp 97.2°F | Resp 16

## 2024-01-08 DIAGNOSIS — N1831 Chronic kidney disease, stage 3a: Secondary | ICD-10-CM | POA: Diagnosis not present

## 2024-01-08 DIAGNOSIS — C679 Malignant neoplasm of bladder, unspecified: Secondary | ICD-10-CM | POA: Diagnosis not present

## 2024-01-08 DIAGNOSIS — Z515 Encounter for palliative care: Secondary | ICD-10-CM

## 2024-01-08 DIAGNOSIS — R197 Diarrhea, unspecified: Secondary | ICD-10-CM | POA: Diagnosis not present

## 2024-01-08 DIAGNOSIS — Z79620 Long term (current) use of immunosuppressive biologic: Secondary | ICD-10-CM | POA: Diagnosis not present

## 2024-01-08 DIAGNOSIS — Z87891 Personal history of nicotine dependence: Secondary | ICD-10-CM | POA: Diagnosis not present

## 2024-01-08 DIAGNOSIS — J309 Allergic rhinitis, unspecified: Secondary | ICD-10-CM | POA: Diagnosis not present

## 2024-01-08 DIAGNOSIS — R5383 Other fatigue: Secondary | ICD-10-CM | POA: Diagnosis not present

## 2024-01-08 DIAGNOSIS — M8458XA Pathological fracture in neoplastic disease, other specified site, initial encounter for fracture: Secondary | ICD-10-CM

## 2024-01-08 DIAGNOSIS — E611 Iron deficiency: Secondary | ICD-10-CM | POA: Diagnosis not present

## 2024-01-08 DIAGNOSIS — Z5112 Encounter for antineoplastic immunotherapy: Secondary | ICD-10-CM | POA: Diagnosis not present

## 2024-01-08 DIAGNOSIS — E538 Deficiency of other specified B group vitamins: Secondary | ICD-10-CM | POA: Diagnosis not present

## 2024-01-08 DIAGNOSIS — C7951 Secondary malignant neoplasm of bone: Secondary | ICD-10-CM | POA: Diagnosis not present

## 2024-01-08 DIAGNOSIS — R7989 Other specified abnormal findings of blood chemistry: Secondary | ICD-10-CM

## 2024-01-08 DIAGNOSIS — R630 Anorexia: Secondary | ICD-10-CM | POA: Diagnosis not present

## 2024-01-08 DIAGNOSIS — R112 Nausea with vomiting, unspecified: Secondary | ICD-10-CM | POA: Diagnosis not present

## 2024-01-08 DIAGNOSIS — R110 Nausea: Secondary | ICD-10-CM | POA: Diagnosis not present

## 2024-01-08 DIAGNOSIS — Z79891 Long term (current) use of opiate analgesic: Secondary | ICD-10-CM | POA: Diagnosis not present

## 2024-01-08 DIAGNOSIS — R530 Neoplastic (malignant) related fatigue: Secondary | ICD-10-CM

## 2024-01-08 DIAGNOSIS — K219 Gastro-esophageal reflux disease without esophagitis: Secondary | ICD-10-CM | POA: Diagnosis not present

## 2024-01-08 DIAGNOSIS — G893 Neoplasm related pain (acute) (chronic): Secondary | ICD-10-CM

## 2024-01-08 HISTORY — DX: Deficiency of other specified B group vitamins: E53.8

## 2024-01-08 LAB — CBC WITH DIFFERENTIAL (CANCER CENTER ONLY)
Abs Immature Granulocytes: 0.96 10*3/uL — ABNORMAL HIGH (ref 0.00–0.07)
Basophils Absolute: 0.3 10*3/uL — ABNORMAL HIGH (ref 0.0–0.1)
Basophils Relative: 2 %
Eosinophils Absolute: 0 10*3/uL (ref 0.0–0.5)
Eosinophils Relative: 0 %
HCT: 30.3 % — ABNORMAL LOW (ref 36.0–46.0)
Hemoglobin: 10.3 g/dL — ABNORMAL LOW (ref 12.0–15.0)
Immature Granulocytes: 8 %
Lymphocytes Relative: 14 %
Lymphs Abs: 1.7 10*3/uL (ref 0.7–4.0)
MCH: 29.9 pg (ref 26.0–34.0)
MCHC: 34 g/dL (ref 30.0–36.0)
MCV: 87.8 fL (ref 80.0–100.0)
Monocytes Absolute: 1.4 10*3/uL — ABNORMAL HIGH (ref 0.1–1.0)
Monocytes Relative: 11 %
Neutro Abs: 8.4 10*3/uL — ABNORMAL HIGH (ref 1.7–7.7)
Neutrophils Relative %: 65 %
Platelet Count: 357 10*3/uL (ref 150–400)
RBC: 3.45 MIL/uL — ABNORMAL LOW (ref 3.87–5.11)
RDW: 14.6 % (ref 11.5–15.5)
Smear Review: NORMAL
WBC Count: 12.8 10*3/uL — ABNORMAL HIGH (ref 4.0–10.5)
nRBC: 0 % (ref 0.0–0.2)

## 2024-01-08 LAB — CMP (CANCER CENTER ONLY)
ALT: 10 U/L (ref 0–44)
AST: 17 U/L (ref 15–41)
Albumin: 3.4 g/dL — ABNORMAL LOW (ref 3.5–5.0)
Alkaline Phosphatase: 105 U/L (ref 38–126)
Anion gap: 10 (ref 5–15)
BUN: 23 mg/dL (ref 8–23)
CO2: 22 mmol/L (ref 22–32)
Calcium: 8.8 mg/dL — ABNORMAL LOW (ref 8.9–10.3)
Chloride: 102 mmol/L (ref 98–111)
Creatinine: 1.53 mg/dL — ABNORMAL HIGH (ref 0.44–1.00)
GFR, Estimated: 37 mL/min — ABNORMAL LOW (ref >60)
Glucose, Bld: 97 mg/dL (ref 70–99)
Potassium: 4 mmol/L (ref 3.5–5.1)
Sodium: 134 mmol/L — ABNORMAL LOW (ref 135–145)
Total Bilirubin: 0.4 mg/dL (ref 0.0–1.2)
Total Protein: 6.5 g/dL (ref 6.5–8.1)

## 2024-01-08 LAB — TSH: TSH: 1.4 u[IU]/mL (ref 0.350–4.500)

## 2024-01-08 LAB — T4, FREE: Free T4: 1.37 ng/dL — ABNORMAL HIGH (ref 0.61–1.12)

## 2024-01-08 MED ORDER — SODIUM CHLORIDE 0.9% FLUSH
10.0000 mL | Freq: Once | INTRAVENOUS | Status: AC
  Administered 2024-01-08: 10 mL

## 2024-01-08 MED ORDER — SODIUM CHLORIDE 0.9 % IV SOLN: INTRAVENOUS | Status: DC

## 2024-01-08 MED ORDER — HEPARIN SOD (PORK) LOCK FLUSH 100 UNIT/ML IV SOLN
500.0000 [IU] | Freq: Once | INTRAVENOUS | Status: AC | PRN
  Administered 2024-01-08: 500 [IU]

## 2024-01-08 MED ORDER — PROCHLORPERAZINE MALEATE 10 MG PO TABS
10.0000 mg | ORAL_TABLET | Freq: Once | ORAL | Status: AC
  Administered 2024-01-08: 10 mg via ORAL
  Filled 2024-01-08: qty 1

## 2024-01-08 MED ORDER — SODIUM CHLORIDE 0.9 % IV SOLN
1.2500 mg/kg | Freq: Once | INTRAVENOUS | Status: AC
  Administered 2024-01-08: 80 mg via INTRAVENOUS
  Filled 2024-01-08: qty 6.6

## 2024-01-08 MED ORDER — SODIUM CHLORIDE 0.9 % IV SOLN
200.0000 mg | Freq: Once | INTRAVENOUS | Status: AC
  Administered 2024-01-08: 200 mg via INTRAVENOUS
  Filled 2024-01-08: qty 200

## 2024-01-08 MED ORDER — DRONABINOL 5 MG PO CAPS: 5.0000 mg | ORAL_CAPSULE | Freq: Two times a day (BID) | ORAL | 0 refills | Status: DC

## 2024-01-08 MED ORDER — FOLIC ACID 1 MG PO TABS: 1.0000 mg | ORAL_TABLET | Freq: Every day | ORAL | Status: DC

## 2024-01-08 MED ORDER — SODIUM CHLORIDE 0.9% FLUSH
10.0000 mL | INTRAVENOUS | Status: DC | PRN
Start: 2024-01-08 — End: 2024-01-08
  Administered 2024-01-08: 10 mL

## 2024-01-08 NOTE — Assessment & Plan Note (Addendum)
Take folic acid 1 mg daily

## 2024-01-08 NOTE — Assessment & Plan Note (Addendum)
Ferrous sulfate 325 mg daily

## 2024-01-08 NOTE — Patient Instructions (Signed)
 [facility] - A DEPT OF [place]  Discharge Instructions: Thank you for choosing [facility] to provide your oncology and hematology care.   If you have a lab appointment with the Cancer Center, please go directly to the Cancer Center and check in at the registration area.   Wear comfortable clothing and clothing appropriate for easy access to any Portacath or PICC line.   We strive to give you quality time with your provider. You may need to reschedule your appointment if you arrive late (15 or more minutes).  Arriving late affects you and other patients whose appointments are after yours.  Also, if you miss three or more appointments without notifying the office, you may be dismissed from the clinic at the provider's discretion.      For prescription refill requests, have your pharmacy contact our office and allow 72 hours for refills to be completed.    Today you received the following chemotherapy and/or immunotherapy agents: Padcev , Keytruda       To help prevent nausea and vomiting after your treatment, we encourage you to take your nausea medication as directed.  BELOW ARE SYMPTOMS THAT SHOULD BE REPORTED IMMEDIATELY: *FEVER GREATER THAN 100.4 F (38 C) OR HIGHER *CHILLS OR SWEATING *NAUSEA AND VOMITING THAT IS NOT CONTROLLED WITH YOUR NAUSEA MEDICATION *UNUSUAL SHORTNESS OF BREATH *UNUSUAL BRUISING OR BLEEDING *URINARY PROBLEMS (pain or burning when urinating, or frequent urination) *BOWEL PROBLEMS (unusual diarrhea, constipation, pain near the anus) TENDERNESS IN MOUTH AND THROAT WITH OR WITHOUT PRESENCE OF ULCERS (sore throat, sores in mouth, or a toothache) UNUSUAL RASH, SWELLING OR PAIN  UNUSUAL VAGINAL DISCHARGE OR ITCHING   Items with * indicate a potential emergency and should be followed up as soon as possible or go to the Emergency Department if any problems should occur.  Please show the CHEMOTHERAPY ALERT CARD or  IMMUNOTHERAPY ALERT CARD at check-in to the Emergency Department and triage nurse.  Should you have questions after your visit or need to cancel or reschedule your appointment, please contact [facility]  Dept: [phone]  and follow the prompts.  Office hours are 8:00 a.m. to 4:30 p.m. Monday - Friday. Please note that voicemails left after 4:00 p.m. may not be returned until the following business day.  We are closed weekends and major holidays. You have access to a nurse at all times for urgent questions. Please call the main number to the clinic Dept: [phone] and follow the prompts.   For any non-urgent questions, you may also contact your provider using MyChart. We now offer e-Visits for anyone 53 and older to request care online for non-urgent symptoms. For details visit [email or web address].   Also download the MyChart app! Go to the app store, search "MyChart", open the app, select [place], and log in with your MyChart username and password.  [facility] - A DEPT OF [facility]  Discharge Instructions: Thank you for choosing [facility] to provide your oncology and hematology care.   If you have a lab appointment with the Cancer Center, please go directly to the Cancer Center and check in at the registration area.   Wear comfortable clothing and clothing appropriate for easy access to any Portacath or PICC line.   We strive to give you quality time with your provider. You may need  to reschedule your appointment if you arrive late (15 or more minutes).  Arriving late affects you and other patients whose appointments are after yours.  Also, if you miss three or more appointments without notifying the office, you may be dismissed from the clinic at the provider's discretion.      For prescription refill requests, have your pharmacy contact our office and allow 72 hours for  refills to be completed.    Today you received the following chemotherapy and/or immunotherapy agents: enfortumab vedotin -ejfv (PADCEV ) and pembrolizumab  (KEYTRUDA )   To help prevent nausea and vomiting after your treatment, we encourage you to take your nausea medication as directed.  BELOW ARE SYMPTOMS THAT SHOULD BE REPORTED IMMEDIATELY: *FEVER GREATER THAN 100.4 F (38 C) OR HIGHER *CHILLS OR SWEATING *NAUSEA AND VOMITING THAT IS NOT CONTROLLED WITH YOUR NAUSEA MEDICATION *UNUSUAL SHORTNESS OF BREATH *UNUSUAL BRUISING OR BLEEDING *URINARY PROBLEMS (pain or burning when urinating, or frequent urination) *BOWEL PROBLEMS (unusual diarrhea, constipation, pain near the anus) TENDERNESS IN MOUTH AND THROAT WITH OR WITHOUT PRESENCE OF ULCERS (sore throat, sores in mouth, or a toothache) UNUSUAL RASH, SWELLING OR PAIN  UNUSUAL VAGINAL DISCHARGE OR ITCHING   Items with * indicate a potential emergency and should be followed up as soon as possible or go to the Emergency Department if any problems should occur.  Please show the CHEMOTHERAPY ALERT CARD or IMMUNOTHERAPY ALERT CARD at check-in to the Emergency Department and triage nurse.  Should you have questions after your visit or need to cancel or reschedule your appointment, please contact [facility]  Dept: [phone]  and follow the prompts.  Office hours are 8:00 a.m. to 4:30 p.m. Monday - Friday. Please note that voicemails left after 4:00 p.m. may not be returned until the following business day.  We are closed weekends and major holidays. You have access to a nurse at all times for urgent questions. Please call the main number to the clinic Dept: [phone] and follow the prompts.   For any non-urgent questions, you may also contact your provider using MyChart. We now offer e-Visits for anyone 79 and older to request care online for non-urgent symptoms. For details visit  [email or web address].   Also download the MyChart app! Go to the app store, search "MyChart", open the app, select [place], and log in with your MyChart username and password.  Enfortumab Vedotin  Injection What is this medication? ENFORTUMAB VEDOTIN  (en FORT ue mab ve DOE tin) treats bladder cancer and kidney cancer. It works by blocking a protein that causes cancer cells to grow and multiply. This helps to slow or stop the spread of cancer cells. This medicine may be used for other purposes; ask your health care provider or pharmacist if you have questions. COMMON BRAND NAME(S): PADCEV  What should I tell my care team before I take this medication? They need to know if you have any of these conditions: Diabetes Eye disease Liver disease Lung disease Tingling of the fingers or toes or other nerve disorder Vision problems An unusual or allergic reaction to enfortumab vedotin , other medications, foods, dyes, or preservatives Pregnant or trying to get pregnant Breast-feeding How should I use this medication? This medication is injected into a vein. It is given by your care team in a hospital or clinic setting. Talk to your care team about the use of this medication in children. Special care may be needed. Overdosage: If you  think you have taken too much of this medicine contact a poison control center or emergency room at once. NOTE: This medicine is only for you. Do not share this medicine with others. What if I miss a dose? Keep appointments for follow-up doses. It is important not to miss your dose. Call your care team if you are unable to keep an appointment. What may interact with this medication? This medication may affect how other medications work, and other medications may affect how this medication works. Talk with your care team about all of the medications you take. They may suggest changes to your treatment plan to lower the risk of side effects and to make sure your  medications work as intended. This list may not describe all possible interactions. Give your health care provider a list of all the medicines, herbs, non-prescription drugs, or dietary supplements you use. Also tell them if you smoke, drink alcohol, or use illegal drugs. Some items may interact with your medicine. What should I watch for while using this medication? Your condition will be monitored carefully while you are receiving this medication. This medication may make you feel generally unwell. This is not uncommon as chemotherapy can affect healthy cells as well as cancer cells. Report any side effects. Continue your course of treatment even though you feel ill unless your care team tells you to stop. This medication may increase blood sugar. The risk may be higher in patients who already have diabetes. Ask your care team what you can do to lower your risk of diabetes while taking this medication. This medication can cause a serious condition in which there is too much acid in your blood. If you develop nausea, vomiting, stomach pain, unusual tiredness, or trouble breathing, stop taking this medication and call your care team right away. If possible, use a ketone dipstick to check for ketones in your urine. This medication may cause dry eyes and blurred vision. If you wear contact lenses, you may feel some discomfort. Lubricating eye drops may help. See your care team if the problem does not go away or is severe. Tell your care team right away if you have any change in your eyesight. This medication may increase your risk of getting an infection. Call your care team for advice if you get a fever, chills, sore throat, or other symptoms of a cold or flu. Do not treat yourself. Try to avoid being around people who are sick. Avoid taking medications that contain aspirin , acetaminophen , ibuprofen , naproxen , or ketoprofen unless instructed by your care team. These medications may hide a fever. This  medication may cause serious skin reactions. They can happen weeks to months after starting the medication. Contact your care team right away if you notice fevers or flu-like symptoms with a rash. The rash may be red or purple and then turn into blisters or peeling of the skin. You may also notice a red rash with swelling of the face, lips or lymph nodes in your neck or under your arms. Talk to your care team if you or your partner may be pregnant. Serious birth defects can occur if you take this medication during pregnancy and for 2 months after the last dose. You will need a negative pregnancy test before starting this medication. Contraception is recommended while taking this medication and for 2 months after the last dose. Your care team can help you find the option that works for you. If your partner can get pregnant, use a condom during  sex while taking this medication and for 4 months after the last dose. Do not breastfeed while taking this medicine or for at least 3 weeks after the last dose. This medication may cause infertility. Talk to your care team if you are concerned about your fertility. What side effects may I notice from receiving this medication? Side effects that you should report to your care team as soon as possible: Allergic reactions--skin rash, itching, hives, swelling of the face, lips, tongue, or throat Dry cough, shortness of breath or trouble breathing Eye pain, redness, irritation, or discharge with blurry or decreased vision High blood sugar (hyperglycemia)--increased thirst or amount of urine, unusual weakness or fatigue, blurry vision Painful swelling, warmth, or redness of the skin, blisters or sores at the infusion site Pain, tingling, or numbness in the hands or feet Redness, blistering, peeling, or loosening of the skin, including inside the mouth Unusual bruising or bleeding Side effects that usually do not require medical attention (report these to your care team  if they continue or are bothersome): Change in taste Diarrhea Dry eyes Fatigue Hair loss Loss of appetite This list may not describe all possible side effects. Call your doctor for medical advice about side effects. You may report side effects to FDA at 1-800-FDA-1088. Where should I keep my medication? This medication is given in a hospital or clinic. It will not be stored at home. NOTE: This sheet is a summary. It may not cover all possible information. If you have questions about this medicine, talk to your doctor, pharmacist, or health care provider.  2024 Elsevier/Gold Standard (2021-12-25 00:00:00)  Pembrolizumab  Injection What is this medication? PEMBROLIZUMAB  (PEM broe LIZ ue mab) treats some types of cancer. It works by helping your immune system slow or stop the spread of cancer cells. It is a monoclonal antibody. This medicine may be used for other purposes; ask your health care provider or pharmacist if you have questions. COMMON BRAND NAME(S): Keytruda  What should I tell my care team before I take this medication? They need to know if you have any of these conditions: Allogeneic stem cell transplant (uses someone else's stem cells) Autoimmune diseases, such as Crohn disease, ulcerative colitis, lupus History of chest radiation Nervous system problems, such as Guillain-Barre syndrome, myasthenia gravis Organ transplant An unusual or allergic reaction to pembrolizumab , other medications, foods, dyes, or preservatives Pregnant or trying to get pregnant Breast-feeding How should I use this medication? This medication is injected into a vein. It is given by your care team in a hospital or clinic setting. A special MedGuide will be given to you before each treatment. Be sure to read this information carefully each time. Talk to your care team about the use of this medication in children. While it may be prescribed for children as young as 6 months for selected conditions,  precautions do apply. Overdosage: If you think you have taken too much of this medicine contact a poison control center or emergency room at once. NOTE: This medicine is only for you. Do not share this medicine with others. What if I miss a dose? Keep appointments for follow-up doses. It is important not to miss your dose. Call your care team if you are unable to keep an appointment. What may interact with this medication? Interactions have not been studied. This list may not describe all possible interactions. Give your health care provider a list of all the medicines, herbs, non-prescription drugs, or dietary supplements you use. Also tell them if  you smoke, drink alcohol, or use illegal drugs. Some items may interact with your medicine. What should I watch for while using this medication? Your condition will be monitored carefully while you are receiving this medication. You may need blood work while taking this medication. This medication may cause serious skin reactions. They can happen weeks to months after starting the medication. Contact your care team right away if you notice fevers or flu-like symptoms with a rash. The rash may be red or purple and then turn into blisters or peeling of the skin. You may also notice a red rash with swelling of the face, lips, or lymph nodes in your neck or under your arms. Tell your care team right away if you have any change in your eyesight. Talk to your care team if you may be pregnant. Serious birth defects can occur if you take this medication during pregnancy and for 4 months after the last dose. You will need a negative pregnancy test before starting this medication. Contraception is recommended while taking this medication and for 4 months after the last dose. Your care team can help you find the option that works for you. Do not breastfeed while taking this medication and for 4 months after the last dose. What side effects may I notice from receiving  this medication? Side effects that you should report to your care team as soon as possible: Allergic reactions--skin rash, itching, hives, swelling of the face, lips, tongue, or throat Dry cough, shortness of breath or trouble breathing Eye pain, redness, irritation, or discharge with blurry or decreased vision Heart muscle inflammation--unusual weakness or fatigue, shortness of breath, chest pain, fast or irregular heartbeat, dizziness, swelling of the ankles, feet, or hands Hormone gland problems--headache, sensitivity to light, unusual weakness or fatigue, dizziness, fast or irregular heartbeat, increased sensitivity to cold or heat, excessive sweating, constipation, hair loss, increased thirst or amount of urine, tremors or shaking, irritability Infusion reactions--chest pain, shortness of breath or trouble breathing, feeling faint or lightheaded Kidney injury (glomerulonephritis)--decrease in the amount of urine, red or dark brown urine, foamy or bubbly urine, swelling of the ankles, hands, or feet Liver injury--right upper belly pain, loss of appetite, nausea, light-colored stool, dark yellow or brown urine, yellowing skin or eyes, unusual weakness or fatigue Pain, tingling, or numbness in the hands or feet, muscle weakness, change in vision, confusion or trouble speaking, loss of balance or coordination, trouble walking, seizures Rash, fever, and swollen lymph nodes Redness, blistering, peeling, or loosening of the skin, including inside the mouth Sudden or severe stomach pain, bloody diarrhea, fever, nausea, vomiting Side effects that usually do not require medical attention (report to your care team if they continue or are bothersome): Bone, joint, or muscle pain Diarrhea Fatigue Loss of appetite Nausea Skin rash This list may not describe all possible side effects. Call your doctor for medical advice about side effects. You may report side effects to FDA at 1-800-FDA-1088. Where  should I keep my medication? This medication is given in a hospital or clinic. It will not be stored at home. NOTE: This sheet is a summary. It may not cover all possible information. If you have questions about this medicine, talk to your doctor, pharmacist, or health care provider.  2024 Elsevier/Gold Standard (2021-12-25 00:00:00)

## 2024-01-08 NOTE — Assessment & Plan Note (Addendum)
 B12 1000 mcg daily

## 2024-01-08 NOTE — Assessment & Plan Note (Addendum)
 Imodium  as needed. Call if no improvement or worsening.

## 2024-01-08 NOTE — Assessment & Plan Note (Addendum)
 Repeat lab before each treatment Increase fluid at home

## 2024-01-08 NOTE — Progress Notes (Signed)
 Palliative Medicine [facility]  [phone] [phone]) [phone]   Name: [name] [id number] Date: 01/08/2024 MRN: [id number]  DOB: 1956-06-05  Patient Care Team: Janece Means, FNP as PCP - General (Family Medicine) Lei Pump, MD as PCP - Electrophysiology (Cardiology) Manfred Seed, MD as PCP - Cardiology (Cardiology) Garry Kansas, MD as Consulting Physician (Neurosurgery) Manfred Seed, MD as Consulting Physician (Cardiology) Candyce Champagne, MD as Consulting Physician (General Surgery) Merriam Abbey, DO as Consulting Physician (Neurology) Steven Elam, [facility] (Inactive) as Pharmacist (Pharmacist) Genell Ken, MD as Consulting Physician (Gastroenterology) Zehr, Martina Sledge, PA-C as Physician Assistant (Gastroenterology) Cristi Donalds, MD as Consulting Physician (Nephrology) Pickenpack-Cousar, Giles Labrum, NP as Nurse Practitioner [facility] and Palliative Medicine)   I connected with ELISABEL LESUEUR on 01/08/24 at  1:00 PM EDT by telephone and verified that I am speaking with the correct person using two identifiers.   I discussed the limitations, risks, security and privacy concerns of performing an evaluation and management service by telemedicine and the availability of in-person appointments. I also discussed with the patient that there may be a patient responsible charge related to this service. The patient expressed understanding and agreed to proceed.   Other persons participating in the visit and their role in the encounter: [name] (daughter)   Patient's location: Home   Provider's location: [facility]   INTERVAL HISTORY: [name] is a 68 y.o. female with oncologic medical history including metastatic bladder cancer with T5 bone metastasis, COPD, chronic back pain from DDD, osteoporosis with fractures, chronic pain management at [facility].  Palliative is seeing patient for symptom management and goals of care.   SOCIAL  HISTORY:     reports that she quit smoking about 3 years ago. Her smoking use included cigarettes. She started smoking about 33 years ago. She has a 15 pack-year smoking history. She has never used smokeless tobacco. She reports current alcohol use of about 2.0 standard drinks of alcohol per week. She reports that she does not use drugs.  ADVANCE DIRECTIVES:  None on file   CODE STATUS: Full code  PAST MEDICAL HISTORY: Past Medical History:  Diagnosis Date   Abdominal pain 10/17/2020   Abnormality of gait due to impairment of balance 11/22/2020   Acute diverticulitis 10/17/2020   Admission for long-term opiate analgesic use 10/24/2019   Arthritis of right hip 05/29/2016   Formatting of this note might be different from the original. Added automatically from request for surgery [id number]   BMI 26.0-26.9,adult 03/29/2020   Bronchial asthma    Cardiomyopathy (HCC)    Overview:  Ejection fraction 45% in 2015 Ejection fraction 30 to 35% in November 2018   Chronic back pain    Chronic constipation 07/31/2021   Chronic hypoxemic respiratory failure (HCC) 10/24/2019   Chronic narcotic use 03/23/2015   Chronic pain of both knees 12/21/2018   Added automatically from request for surgery [id number]  Formatting of this note might be different from the original. 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 06/19/2017   Dyslipidemia 05/31/2015   GERD (gastroesophageal reflux disease)    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HCC) 07/13/2020   Hypotension 05/17/2020   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   Ileus following gastrointestinal surgery (HCC) 03/26/2015   Major depressive disorder, single episode, moderate (HCC) 10/24/2019   Malnutrition of moderate degree (HCC) 10/20/2020   Mesenteric ischemia (HCC) 10/16/2020   Migraine without aura with status migrainosus 10/24/2019   Mixed incontinence 10/20/2020   Myocardial infarction (HCC)    Nausea & vomiting    Obstructive chronic bronchitis with exacerbation (HCC) 10/25/2019   Other spondylosis with radiculopathy, lumbar region 10/24/2019   Persistent vomiting 05/17/2020   Presence of left artificial hip joint 10/24/2019   PTSD (post-traumatic stress disorder)    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 03/23/2015 04/19/2014   Senile osteoporosis 10/24/2019   Sepsis (HCC) 10/17/2020   Serosanguineous chronic otitis media of right ear 08/02/2021   Small bowel obstruction (HCC)    Thyroid  disease    Upper respiratory tract infection due to COVID-19 virus 07/06/2021   Wellness examination 03/27/2021    ALLERGIES:  has no known allergies.  MEDICATIONS:  Current Outpatient Medications  Medication Sig Dispense Refill   ARIPiprazole  (ABILIFY ) 5 MG tablet TAKE 1 TABLET BY MOUTH DAILY 30 tablet 10   aspirin  81 MG tablet Take 81 mg by mouth daily.     BREO ELLIPTA  100-25 MCG/ACT AEPB INHALE 1 PUFF INTO THE LUNGS DAILY. (Patient taking differently: Inhale 1 puff into the lungs daily as needed (SOB).) 60 each 10   Carboxymethylcell-Glycerin  PF 0.5-0.9 % SOLN Place 2 drops into both eyes 4 (four) times daily. 1 each 11   carvedilol  (COREG ) 3.125 MG tablet TAKE ONE (1) TABLET BY MOUTH TWICE DAILY WITH MEALS (Patient taking differently: Take 3.125 mg by mouth 2 (two) times  daily with a meal.) 60 tablet 10   denosumab  (PROLIA ) 60 MG/ML SOSY injection Inject 60 mg into the skin every 6 (six) months. 180 mL 2   dextromethorphan  15 MG/5ML syrup Take 10 mLs (30 mg total) by mouth 4 (four) times daily as needed for cough. 120 mL 0   diclofenac  Sodium (VOLTAREN ) 1 % GEL Apply 2 g topically 4 (four) times daily. (Patient taking differently: Apply 2 g topically as needed (Pain).) 50 g 4   diphenoxylate -atropine  (LOMOTIL ) 2.5-0.025 MG tablet Take 1 tablet by mouth 4 (four) times daily as needed for diarrhea or loose stools. 60 tablet 0   dronabinol  (MARINOL ) 5 MG capsule Take 1 capsule (5 mg total) by mouth 2 (two) times daily before a meal. 60 capsule 0   ENTRESTO  24-26 MG TAKE ONE (1) TABLET BY MOUTH TWICE DAILY (Patient taking differently: Take 1 tablet by mouth 2 (two) times daily.) 60 tablet 10   EPINEPHRINE  0.3 mg/0.3 mL IJ SOAJ injection Inject 0.3 mLs (0.3 mg total) into the muscle as needed for anaphylaxis. 1 each 2   esomeprazole  (NEXIUM ) 20 MG capsule TAKE ONE (1) CAPSULE BY MOUTH ONCE DAILY (Patient taking differently: Take 20 mg by mouth daily at 12 noon.) 90 capsule 1   FARXIGA 5 MG TABS tablet Take 5 mg by mouth daily.     ferrous sulfate  325 (65 FE) MG tablet Take 1 tablet (325 mg total) by mouth at bedtime.     fluticasone  (FLONASE ) 50 MCG/ACT  nasal spray USE 2 SPRAYS IN EACH NOSTRILS DAILY AS NEEDED (Patient taking differently: Place 2 sprays into both nostrils daily as needed for allergies or rhinitis.) 48 g 0   folic acid  (FOLVITE ) 1 MG tablet Take 1 tablet (1 mg total) by mouth daily.     ipratropium-albuterol  (DUONEB) 0.5-2.5 (3) MG/3ML SOLN USE 1 VIAL VIA NEBULIZER EVERY 6 HOURS AS NEEDED FOR SHORTNESS OF BREATH (Patient taking differently: Take 3 mLs by nebulization every 6 (six) hours as needed (SOB).) 90 mL 0   levocetirizine (XYZAL) 5 MG tablet Take 5 mg by mouth daily as needed for allergies.     lidocaine  (LIDODERM ) 5 % Place 1 patch onto the skin  daily. Remove & Discard patch within 12 hours or as directed by MD     lidocaine -prilocaine  (EMLA ) cream Apply to affected area once (Patient taking differently: Apply 1 Application topically once. Apply to affected area once) 30 g 3   lubiprostone  (AMITIZA ) 24 MCG capsule TAKE 1 CAPSULE(24 MCG) BY MOUTH TWICE DAILY WITH A MEAL (Patient taking differently: Take 24 mcg by mouth 2 (two) times daily with a meal.) 180 capsule 1   methadone  (DOLOPHINE ) 10 MG tablet Take 1 tablet (10 mg total) by mouth every 8 (eight) hours. 90 tablet 0   methylPREDNISolone  (MEDROL  DOSEPAK) 4 MG TBPK tablet If significant rash, will start at 6 tabs on day 1, then 5 tabs on day 2, then 4 tabs on day 3, then 3 tabs on day 4, then 2 tabs on day 5 and then 1 tab on day 6. Take with food. 21 tablet 0   metoCLOPramide (REGLAN) 10 MG tablet Take 1 tablet (10 mg total) by mouth every 8 (eight) hours as needed for nausea. 45 tablet 1   Multiple Vitamin (MULTIVITAMIN WITH MINERALS) TABS tablet Take 1 tablet by mouth daily.     naloxone  (NARCAN ) nasal spray 4 mg/0.1 mL Place 1 spray into the nose once.     nitroGLYCERIN  (NITROSTAT ) 0.4 MG SL tablet Place 1 tablet (0.4 mg total) under the tongue every 5 (five) minutes as needed for chest pain. 25 tablet 6   ondansetron  (ZOFRAN ) 4 MG tablet Take 1 tablet (4 mg total) by mouth every 8 (eight) hours as needed for nausea or vomiting. 20 tablet 0   ondansetron  (ZOFRAN ) 8 MG tablet Take 1 tablet (8 mg total) by mouth every 8 (eight) hours as needed for nausea or vomiting. 30 tablet 1   oxyCODONE  (ROXICODONE ) 15 MG immediate release tablet Take 1 tablet (15 mg total) by mouth every 4 (four) hours as needed for pain. 90 tablet 0   pregabalin (LYRICA) 50 MG capsule Take 1 capsule (50 mg total) by mouth 2 (two) times daily. 60 capsule 2   prochlorperazine  (COMPAZINE ) 10 MG tablet TAKE 1 TABLET(10 MG) BY MOUTH EVERY 6 HOURS AS NEEDED FOR NAUSEA OR VOMITING 30 tablet 1   ranolazine  (RANEXA ) 500 MG  12 hr tablet TAKE 1 TABLET BY MOUTH TWICE DAILY 60 tablet 10   rosuvastatin  (CRESTOR ) 20 MG tablet TAKE 1 TABLET BY MOUTH ONCE DAILY 90 tablet 1   sertraline  (ZOLOFT ) 100 MG tablet TAKE 1/2 TABLET(50 MG) BY MOUTH DAILY (Patient taking differently: Take 50 mg by mouth daily.) 45 tablet 1   tiZANidine  (ZANAFLEX ) 4 MG tablet Take 1 tablet (4 mg total) by mouth at bedtime. 30 tablet 3   topiramate  (TOPAMAX ) 50 MG tablet TAKE TWO (2) TABLETS BY MOUTH EVERY DAY AT BEDTIME (Patient taking  differently: Take 50 mg by mouth at bedtime. TAKE TWO (2) TABLETS BY MOUTH EVERY DAY AT BEDTIME) 60 tablet 10   Ubrogepant  (UBRELVY ) 100 MG TABS Take 1 tablet (100 mg total) by mouth as needed (May repeat after 2 hours.  Maximum 2 tablets in 24 hours). TAKE 1 TABLET BY MOUTH BY MOUTH AS NEEDED( MAY REPEAT 1 TABLET AFTER 2 HOURS IF NEEDED, MAXIMUM 2 TABLETS IN 24 HOURS) Strength: 100 mg 48 tablet 1   Vitamin D , Ergocalciferol , (DRISDOL ) 1.25 MG (50000 UNIT) CAPS capsule TAKE 1 CAPSULE BY MOUTH EVERY 7 DAYS FOR 12 DOSES (Patient taking differently: Take 50,000 Units by mouth every 7 (seven) days. Patient usually takes this medication on mondays) 12 capsule 0   No current facility-administered medications for this visit.    VITAL SIGNS: There were no vitals taken for this visit. There were no vitals filed for this visit.  Estimated body mass index is 29.73 kg/m as calculated from the following:   Height as of 12/24/23: 4\' 10"  (1.473 m).   Weight as of 12/25/23: 142 lb 4 oz (64.5 kg).   PERFORMANCE STATUS (ECOG) : 3 - Symptomatic, >50% confined to bed  IMPRESSION: Discussed the use of AI scribe software for clinical note transcription with the patient, who gave verbal consent to proceed.  History of Present Illness [name] is a 68 year old female with chronic pain and recent diagnosis of metastatic bladder cancer with T5 bone involvement who presents for symptom management follow-up. She is accompanied by her  daughter [name].    She has been experiencing severe diarrhea for the past three to four days, even without food intake. The diarrhea is described as severe and random, persisting however with some improvement with the use of Imodium . The diarrhea is so severe that it occurs spontaneously when she stands up. We discussed continuing to manage with Immodium if regimen becomes ineffective patient knows to contact office.   Her nausea is persistent and exacerbated by the smell of food, such as oatmeal, which recently caused her to feel nauseous. Reglan, recently prescribed, is helping manage the nausea. She has been unable to eat for the past three to four days, with attempts to consume simple foods like broth, soup, mashed potatoes, crackers, or yogurt being unsuccessful due to nausea. She can drink fluids, but they pass through quickly, leaving her feeling full throughout the day. Discussed continued use of Reglan every 6 hours as needed in addition to [employer].   She is experiencing pain, particularly in her neck and down her body. She is taking oxycodone  every four hours as needed and methadone  every eight hours for pain management. The neck pain has improved, but she continues to experience pain in other areas. No adjustments to pain regimen at this time. We will continue to closely monitor.    I discussed the importance of continued conversation with family and their medical providers regarding overall plan of care and treatment options, ensuring decisions are within the context of the patients values and GOCs. Assessment & Plan  Diarrhea Severe diarrhea persists despite Imodium , with dehydration risk due to limited intake. - Continue Imodium  as needed.       - Monitor for dehydration.  Nausea Persistent nausea limits intake; Reglan provides partial relief. Compazine  unavailable, considering marijuana pills. - Administer Reglan every 6 hours as needed. - Modify diet with broth and soup. BRAT  diet.  Generalized weakness and fatigue Weakness and fatigue likely from poor nutrition  and ongoing symptoms. - Encourage fluid intake.  Cancer pain Neck pain improving; other pain persists but slowly improving. Current regimen includes oxycodone  and methadone . - Continue oxycodone  every 4 to 6 hours as needed. - Continue methadone  every 8 hours.  Goals of Care Discussed enhancing quality of life through non-medical interventions like a beach trip. - Plan a weekend trip to the beach with medications and wheelchair.  Follow-up Scheduled to monitor progress and adjust treatment. - Follow up on Jan 15, 2024. - Review medication list for accuracy at next visit.  Patient expressed understanding and was in agreement with this plan. She also understands that She can call the clinic at any time with any questions, concerns, or complaints.   Any controlled substances utilized were prescribed in the context of palliative care. PDMP has been reviewed.   Visit consisted of counseling and education dealing with the complex and emotionally intense issues of symptom management and palliative care in the setting of serious and potentially life-threatening illness.  Dellia Ferguson, AGPCNP-BC  Palliative Medicine Team/[facility]

## 2024-01-08 NOTE — Assessment & Plan Note (Addendum)
 continue EV/P today Seeing palliative care today Follow up before C2D8

## 2024-01-08 NOTE — Assessment & Plan Note (Signed)
 Resend her Marinol  to another pharmacy.

## 2024-01-10 DIAGNOSIS — J449 Chronic obstructive pulmonary disease, unspecified: Secondary | ICD-10-CM | POA: Diagnosis not present

## 2024-01-12 ENCOUNTER — Encounter

## 2024-01-13 DIAGNOSIS — M8008XD Age-related osteoporosis with current pathological fracture, vertebra(e), subsequent encounter for fracture with routine healing: Secondary | ICD-10-CM | POA: Diagnosis not present

## 2024-01-13 DIAGNOSIS — E785 Hyperlipidemia, unspecified: Secondary | ICD-10-CM | POA: Diagnosis not present

## 2024-01-13 DIAGNOSIS — M47814 Spondylosis without myelopathy or radiculopathy, thoracic region: Secondary | ICD-10-CM | POA: Diagnosis not present

## 2024-01-13 DIAGNOSIS — I420 Dilated cardiomyopathy: Secondary | ICD-10-CM | POA: Diagnosis not present

## 2024-01-13 DIAGNOSIS — M4726 Other spondylosis with radiculopathy, lumbar region: Secondary | ICD-10-CM | POA: Diagnosis not present

## 2024-01-13 DIAGNOSIS — E440 Moderate protein-calorie malnutrition: Secondary | ICD-10-CM | POA: Diagnosis not present

## 2024-01-13 DIAGNOSIS — N1832 Chronic kidney disease, stage 3b: Secondary | ICD-10-CM | POA: Diagnosis not present

## 2024-01-13 DIAGNOSIS — F5101 Primary insomnia: Secondary | ICD-10-CM | POA: Diagnosis not present

## 2024-01-13 DIAGNOSIS — D72829 Elevated white blood cell count, unspecified: Secondary | ICD-10-CM | POA: Diagnosis not present

## 2024-01-13 DIAGNOSIS — J9611 Chronic respiratory failure with hypoxia: Secondary | ICD-10-CM | POA: Diagnosis not present

## 2024-01-13 DIAGNOSIS — J449 Chronic obstructive pulmonary disease, unspecified: Secondary | ICD-10-CM | POA: Diagnosis not present

## 2024-01-13 DIAGNOSIS — N179 Acute kidney failure, unspecified: Secondary | ICD-10-CM | POA: Diagnosis not present

## 2024-01-13 DIAGNOSIS — E611 Iron deficiency: Secondary | ICD-10-CM | POA: Diagnosis not present

## 2024-01-13 DIAGNOSIS — N1330 Unspecified hydronephrosis: Secondary | ICD-10-CM | POA: Diagnosis not present

## 2024-01-13 DIAGNOSIS — I5022 Chronic systolic (congestive) heart failure: Secondary | ICD-10-CM | POA: Diagnosis not present

## 2024-01-13 DIAGNOSIS — G8929 Other chronic pain: Secondary | ICD-10-CM | POA: Diagnosis not present

## 2024-01-13 DIAGNOSIS — I5042 Chronic combined systolic (congestive) and diastolic (congestive) heart failure: Secondary | ICD-10-CM | POA: Diagnosis not present

## 2024-01-13 DIAGNOSIS — D520 Dietary folate deficiency anemia: Secondary | ICD-10-CM | POA: Diagnosis not present

## 2024-01-13 DIAGNOSIS — M899 Disorder of bone, unspecified: Secondary | ICD-10-CM | POA: Diagnosis not present

## 2024-01-13 DIAGNOSIS — M069 Rheumatoid arthritis, unspecified: Secondary | ICD-10-CM | POA: Diagnosis not present

## 2024-01-13 DIAGNOSIS — D631 Anemia in chronic kidney disease: Secondary | ICD-10-CM | POA: Diagnosis not present

## 2024-01-13 DIAGNOSIS — I2510 Atherosclerotic heart disease of native coronary artery without angina pectoris: Secondary | ICD-10-CM | POA: Diagnosis not present

## 2024-01-13 DIAGNOSIS — D630 Anemia in neoplastic disease: Secondary | ICD-10-CM | POA: Diagnosis not present

## 2024-01-13 DIAGNOSIS — C679 Malignant neoplasm of bladder, unspecified: Secondary | ICD-10-CM | POA: Diagnosis not present

## 2024-01-13 DIAGNOSIS — G8220 Paraplegia, unspecified: Secondary | ICD-10-CM | POA: Diagnosis not present

## 2024-01-15 ENCOUNTER — Ambulatory Visit

## 2024-01-15 ENCOUNTER — Inpatient Hospital Stay (HOSPITAL_BASED_OUTPATIENT_CLINIC_OR_DEPARTMENT_OTHER)

## 2024-01-15 ENCOUNTER — Inpatient Hospital Stay (HOSPITAL_BASED_OUTPATIENT_CLINIC_OR_DEPARTMENT_OTHER): Admitting: Nurse Practitioner

## 2024-01-15 ENCOUNTER — Inpatient Hospital Stay

## 2024-01-15 ENCOUNTER — Other Ambulatory Visit

## 2024-01-15 ENCOUNTER — Other Ambulatory Visit: Payer: Self-pay

## 2024-01-15 ENCOUNTER — Encounter: Payer: Self-pay | Admitting: Nurse Practitioner

## 2024-01-15 VITALS — BP 91/46 | HR 69 | Temp 97.5°F | Resp 18

## 2024-01-15 VITALS — BP 90/55 | HR 96

## 2024-01-15 DIAGNOSIS — M5489 Other dorsalgia: Secondary | ICD-10-CM

## 2024-01-15 DIAGNOSIS — R112 Nausea with vomiting, unspecified: Secondary | ICD-10-CM | POA: Diagnosis not present

## 2024-01-15 DIAGNOSIS — Z5112 Encounter for antineoplastic immunotherapy: Secondary | ICD-10-CM | POA: Diagnosis not present

## 2024-01-15 DIAGNOSIS — E861 Hypovolemia: Secondary | ICD-10-CM

## 2024-01-15 DIAGNOSIS — C7951 Secondary malignant neoplasm of bone: Secondary | ICD-10-CM | POA: Diagnosis not present

## 2024-01-15 DIAGNOSIS — D520 Dietary folate deficiency anemia: Secondary | ICD-10-CM | POA: Diagnosis not present

## 2024-01-15 DIAGNOSIS — R7989 Other specified abnormal findings of blood chemistry: Secondary | ICD-10-CM

## 2024-01-15 DIAGNOSIS — C679 Malignant neoplasm of bladder, unspecified: Secondary | ICD-10-CM

## 2024-01-15 DIAGNOSIS — R5383 Other fatigue: Secondary | ICD-10-CM | POA: Diagnosis not present

## 2024-01-15 DIAGNOSIS — I959 Hypotension, unspecified: Secondary | ICD-10-CM | POA: Diagnosis not present

## 2024-01-15 DIAGNOSIS — J309 Allergic rhinitis, unspecified: Secondary | ICD-10-CM | POA: Diagnosis not present

## 2024-01-15 DIAGNOSIS — N1831 Chronic kidney disease, stage 3a: Secondary | ICD-10-CM | POA: Diagnosis not present

## 2024-01-15 DIAGNOSIS — G893 Neoplasm related pain (acute) (chronic): Secondary | ICD-10-CM

## 2024-01-15 DIAGNOSIS — Z79891 Long term (current) use of opiate analgesic: Secondary | ICD-10-CM | POA: Diagnosis not present

## 2024-01-15 DIAGNOSIS — Z515 Encounter for palliative care: Secondary | ICD-10-CM

## 2024-01-15 DIAGNOSIS — N179 Acute kidney failure, unspecified: Secondary | ICD-10-CM

## 2024-01-15 DIAGNOSIS — Z87891 Personal history of nicotine dependence: Secondary | ICD-10-CM | POA: Diagnosis not present

## 2024-01-15 DIAGNOSIS — R530 Neoplastic (malignant) related fatigue: Secondary | ICD-10-CM | POA: Diagnosis not present

## 2024-01-15 DIAGNOSIS — Z79620 Long term (current) use of immunosuppressive biologic: Secondary | ICD-10-CM | POA: Diagnosis not present

## 2024-01-15 DIAGNOSIS — R197 Diarrhea, unspecified: Secondary | ICD-10-CM

## 2024-01-15 DIAGNOSIS — D649 Anemia, unspecified: Secondary | ICD-10-CM

## 2024-01-15 DIAGNOSIS — K219 Gastro-esophageal reflux disease without esophagitis: Secondary | ICD-10-CM | POA: Diagnosis not present

## 2024-01-15 DIAGNOSIS — M8458XA Pathological fracture in neoplastic disease, other specified site, initial encounter for fracture: Secondary | ICD-10-CM

## 2024-01-15 LAB — CBC WITH DIFFERENTIAL (CANCER CENTER ONLY)
Abs Immature Granulocytes: 0.19 10*3/uL — ABNORMAL HIGH (ref 0.00–0.07)
Basophils Absolute: 0.1 10*3/uL (ref 0.0–0.1)
Basophils Relative: 1 %
Eosinophils Absolute: 0 10*3/uL (ref 0.0–0.5)
Eosinophils Relative: 0 %
HCT: 25.5 % — ABNORMAL LOW (ref 36.0–46.0)
Hemoglobin: 8.6 g/dL — ABNORMAL LOW (ref 12.0–15.0)
Immature Granulocytes: 2 %
Lymphocytes Relative: 12 %
Lymphs Abs: 1.5 10*3/uL (ref 0.7–4.0)
MCH: 30.1 pg (ref 26.0–34.0)
MCHC: 33.7 g/dL (ref 30.0–36.0)
MCV: 89.2 fL (ref 80.0–100.0)
Monocytes Absolute: 1.1 10*3/uL — ABNORMAL HIGH (ref 0.1–1.0)
Monocytes Relative: 9 %
Neutro Abs: 9.4 10*3/uL — ABNORMAL HIGH (ref 1.7–7.7)
Neutrophils Relative %: 76 %
Platelet Count: 192 10*3/uL (ref 150–400)
RBC: 2.86 MIL/uL — ABNORMAL LOW (ref 3.87–5.11)
RDW: 15.2 % (ref 11.5–15.5)
WBC Count: 12.4 10*3/uL — ABNORMAL HIGH (ref 4.0–10.5)
nRBC: 0.2 % (ref 0.0–0.2)

## 2024-01-15 LAB — CMP (CANCER CENTER ONLY)
ALT: 9 U/L (ref 0–44)
AST: 22 U/L (ref 15–41)
Albumin: 3.1 g/dL — ABNORMAL LOW (ref 3.5–5.0)
Alkaline Phosphatase: 89 U/L (ref 38–126)
Anion gap: 11 (ref 5–15)
BUN: 52 mg/dL — ABNORMAL HIGH (ref 8–23)
CO2: 20 mmol/L — ABNORMAL LOW (ref 22–32)
Calcium: 8.8 mg/dL — ABNORMAL LOW (ref 8.9–10.3)
Chloride: 97 mmol/L — ABNORMAL LOW (ref 98–111)
Creatinine: 2.82 mg/dL — ABNORMAL HIGH (ref 0.44–1.00)
GFR, Estimated: 18 mL/min — ABNORMAL LOW (ref >60)
Glucose, Bld: 103 mg/dL — ABNORMAL HIGH (ref 70–99)
Potassium: 4.2 mmol/L (ref 3.5–5.1)
Sodium: 128 mmol/L — ABNORMAL LOW (ref 135–145)
Total Bilirubin: 0.5 mg/dL (ref 0.0–1.2)
Total Protein: 5.9 g/dL — ABNORMAL LOW (ref 6.5–8.1)

## 2024-01-15 MED ORDER — SODIUM CHLORIDE 0.9 % IV SOLN
INTRAVENOUS | Status: AC
Start: 2024-01-15 — End: 2024-01-15

## 2024-01-15 MED ORDER — SODIUM CHLORIDE 0.9% FLUSH
10.0000 mL | Freq: Once | INTRAVENOUS | Status: AC
  Administered 2024-01-15: 10 mL

## 2024-01-15 MED ORDER — OXYCODONE HCL 15 MG PO TABS
15.0000 mg | ORAL_TABLET | ORAL | 0 refills | Status: DC | PRN
Start: 2024-01-18 — End: 2024-01-29

## 2024-01-15 MED ORDER — SODIUM CHLORIDE 0.9 % IV SOLN: INTRAVENOUS | Status: DC

## 2024-01-15 MED ORDER — PROCHLORPERAZINE MALEATE 10 MG PO TABS
10.0000 mg | ORAL_TABLET | Freq: Once | ORAL | Status: AC
  Administered 2024-01-15: 10 mg via ORAL
  Filled 2024-01-15: qty 1

## 2024-01-15 MED ORDER — SODIUM CHLORIDE 0.9 % IV SOLN
1.2500 mg/kg | Freq: Once | INTRAVENOUS | Status: AC
  Administered 2024-01-15: 80 mg via INTRAVENOUS
  Filled 2024-01-15: qty 6

## 2024-01-15 NOTE — Assessment & Plan Note (Addendum)
 B12 1000 mcg daily

## 2024-01-15 NOTE — Progress Notes (Signed)
 Per [name] Pt to receive 1 L of NS prior to tx today. Per [name] OK to proceed w/ tx today with elevated SCR 2.82 after IVF infusion.

## 2024-01-15 NOTE — Progress Notes (Signed)
 Palliative Medicine [facility]  [phone] [phone]) [phone]   Name: [name] Date: 01/15/2024 MRN: [id number]  DOB: 1955-11-30  Patient Care Team: Janece Means, FNP as PCP - General (Family Medicine) Lei Pump, MD as PCP - Electrophysiology (Cardiology) Manfred Seed, MD as PCP - Cardiology (Cardiology) Garry Kansas, MD as Consulting Physician (Neurosurgery) Manfred Seed, MD as Consulting Physician (Cardiology) Candyce Champagne, MD as Consulting Physician (General Surgery) Merriam Abbey, DO as Consulting Physician (Neurology) Steven Elam, [facility] (Inactive) as Pharmacist (Pharmacist) Genell Ken, MD as Consulting Physician (Gastroenterology) Zehr, Martina Sledge, PA-C as Physician Assistant (Gastroenterology) Cristi Donalds, MD as Consulting Physician (Nephrology) Pickenpack-Cousar, Giles Labrum, NP as Nurse Practitioner (Hospice and Palliative Medicine)    INTERVAL HISTORY: [name] is a 68 y.o. female with oncologic medical history including metastatic bladder cancer with T5 bone metastasis, COPD, chronic back pain from DDD, osteoporosis with fractures, chronic pain management at [facility] medical.  Palliative is seeing patient for symptom management and goals of care.   SOCIAL HISTORY:     reports that she quit smoking about 3 years ago. Her smoking use included cigarettes. She started smoking about 33 years ago. She has a 15 pack-year smoking history. She has never used smokeless tobacco. She reports current alcohol use of about 2.0 standard drinks of alcohol per week. She reports that she does not use drugs.  ADVANCE DIRECTIVES:  None on file   CODE STATUS: Full code  PAST MEDICAL HISTORY: Past Medical History:  Diagnosis Date   Abdominal pain 10/17/2020   Abnormality of gait due to impairment of balance 11/22/2020   Acute diverticulitis 10/17/2020   Admission for long-term opiate analgesic use 10/24/2019    Arthritis of right hip 05/29/2016   Formatting of this note might be different from the original. Added automatically from request for surgery [id number]   BMI 26.0-26.9,adult 03/29/2020   Bronchial asthma    Cardiomyopathy (HCC)    Overview:  Ejection fraction 45% in 2015 Ejection fraction 30 to 35% in November 2018   Chronic back pain    Chronic constipation 07/31/2021   Chronic hypoxemic respiratory failure (HCC) 10/24/2019   Chronic narcotic use 03/23/2015   Chronic pain of both knees 12/21/2018   Added automatically from request for surgery [id number]  Formatting of this note might be different from the original. 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   Hypotension 05/17/2020   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   Ileus following gastrointestinal surgery (HCC) 03/26/2015   Major depressive disorder, single episode, moderate (HCC) 10/24/2019   Malnutrition of moderate degree (HCC) 10/20/2020   Mesenteric ischemia (HCC) 10/16/2020   Migraine without aura with status migrainosus 10/24/2019   Mixed incontinence 10/20/2020   Myocardial infarction (HCC)  Nausea & vomiting    Obstructive chronic bronchitis with exacerbation (HCC)  10/25/2019   Other spondylosis with radiculopathy, lumbar region 10/24/2019   Persistent vomiting 05/17/2020   Presence of left artificial hip joint 10/24/2019   PTSD (post-traumatic stress disorder)    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 03/23/2015 04/19/2014   Senile osteoporosis 10/24/2019   Sepsis (HCC) 10/17/2020   Serosanguineous chronic otitis media of right ear 08/02/2021   Small bowel obstruction (HCC)    Thyroid  disease    Upper respiratory tract infection due to COVID-19 virus 07/06/2021   Wellness examination 03/27/2021    ALLERGIES:  is allergic to lyrica  [pregabalin ].  MEDICATIONS:  Current Outpatient Medications  Medication Sig Dispense Refill   ARIPiprazole  (ABILIFY ) 5 MG tablet TAKE 1 TABLET BY MOUTH DAILY 30 tablet 10   aspirin  81 MG tablet Take 81 mg by mouth daily.     BREO ELLIPTA  100-25 MCG/ACT AEPB INHALE 1 PUFF INTO THE LUNGS DAILY. (Patient taking differently: Inhale 1 puff into the lungs daily as needed (SOB).) 60 each 10   Carboxymethylcell-Glycerin  PF 0.5-0.9 % SOLN Place 2 drops into both eyes 4 (four) times daily. 1 each 11   carvedilol  (COREG ) 3.125 MG tablet TAKE ONE (1) TABLET BY MOUTH TWICE DAILY WITH MEALS (Patient taking differently: Take 3.125 mg by mouth 2 (two) times daily with a meal.) 60 tablet 10   denosumab  (PROLIA ) 60 MG/ML SOSY injection Inject 60 mg into the skin every 6 (six) months. 180 mL 2   dextromethorphan  15 MG/5ML syrup Take 10 mLs (30 mg total) by mouth 4 (four) times daily as needed for cough. 120 mL 0   diclofenac  Sodium (VOLTAREN ) 1 % GEL Apply 2 g topically 4 (four) times daily. (Patient taking differently: Apply 2 g topically as needed (Pain).) 50 g 4   diphenoxylate -atropine  (LOMOTIL ) 2.5-0.025 MG tablet Take 1 tablet by mouth 4 (four) times daily as needed for diarrhea or loose stools. 60 tablet 0   dronabinol  (MARINOL ) 5 MG capsule Take 1 capsule (5 mg total) by mouth 2 (two) times daily  before a meal. 60 capsule 0   ENTRESTO  24-26 MG TAKE ONE (1) TABLET BY MOUTH TWICE DAILY (Patient taking differently: Take 1 tablet by mouth 2 (two) times daily.) 60 tablet 10   EPINEPHRINE  0.3 mg/0.3 mL IJ SOAJ injection Inject 0.3 mLs (0.3 mg total) into the muscle as needed for anaphylaxis. 1 each 2   esomeprazole  (NEXIUM ) 20 MG capsule TAKE ONE (1) CAPSULE BY MOUTH ONCE DAILY (Patient taking differently: Take 20 mg by mouth daily at 12 noon.) 90 capsule 1   FARXIGA 5 MG TABS tablet Take 5 mg by mouth daily.     ferrous sulfate  325 (65 FE) MG tablet Take 1 tablet (325 mg total) by mouth at bedtime.     fluticasone  (FLONASE ) 50 MCG/ACT nasal spray USE 2 SPRAYS IN EACH NOSTRILS DAILY AS NEEDED (Patient taking differently: Place 2 sprays into both nostrils daily as needed for allergies or rhinitis.) 48 g 0   folic acid  (FOLVITE ) 1 MG tablet Take 1 tablet (1 mg total) by mouth daily.     ipratropium-albuterol  (DUONEB) 0.5-2.5 (3) MG/3ML SOLN USE 1 VIAL VIA NEBULIZER EVERY 6 HOURS AS NEEDED FOR SHORTNESS OF BREATH (Patient taking differently: Take 3 mLs by nebulization every 6 (six) hours as needed (SOB).) 90 mL 0   levocetirizine (XYZAL) 5 MG tablet Take 5 mg by mouth daily as needed for allergies.  lidocaine  (LIDODERM ) 5 % Place 1 patch onto the skin daily. Remove & Discard patch within 12 hours or as directed by MD     lidocaine -prilocaine  (EMLA ) cream Apply to affected area once (Patient taking differently: Apply 1 Application topically once. Apply to affected area once) 30 g 3   lubiprostone  (AMITIZA ) 24 MCG capsule TAKE 1 CAPSULE(24 MCG) BY MOUTH TWICE DAILY WITH A MEAL (Patient taking differently: Take 24 mcg by mouth 2 (two) times daily with a meal.) 180 capsule 1   methadone  (DOLOPHINE ) 10 MG tablet Take 1 tablet (10 mg total) by mouth every 8 (eight) hours. 90 tablet 0   methylPREDNISolone  (MEDROL  DOSEPAK) 4 MG TBPK tablet If significant rash, will start at 6 tabs on day 1, then 5 tabs on  day 2, then 4 tabs on day 3, then 3 tabs on day 4, then 2 tabs on day 5 and then 1 tab on day 6. Take with food. 21 tablet 0   metoCLOPramide  (REGLAN ) 10 MG tablet Take 1 tablet (10 mg total) by mouth every 8 (eight) hours as needed for nausea. 45 tablet 1   Multiple Vitamin (MULTIVITAMIN WITH MINERALS) TABS tablet Take 1 tablet by mouth daily.     naloxone  (NARCAN ) nasal spray 4 mg/0.1 mL Place 1 spray into the nose once.     nitroGLYCERIN  (NITROSTAT ) 0.4 MG SL tablet Place 1 tablet (0.4 mg total) under the tongue every 5 (five) minutes as needed for chest pain. 25 tablet 6   ondansetron  (ZOFRAN ) 4 MG tablet Take 1 tablet (4 mg total) by mouth every 8 (eight) hours as needed for nausea or vomiting. 20 tablet 0   ondansetron  (ZOFRAN ) 8 MG tablet Take 1 tablet (8 mg total) by mouth every 8 (eight) hours as needed for nausea or vomiting. 30 tablet 1   [START ON 01/18/2024] oxyCODONE  (ROXICODONE ) 15 MG immediate release tablet Take 1 tablet (15 mg total) by mouth every 4 (four) hours as needed for pain. 90 tablet 0   prochlorperazine  (COMPAZINE ) 10 MG tablet TAKE 1 TABLET(10 MG) BY MOUTH EVERY 6 HOURS AS NEEDED FOR NAUSEA OR VOMITING 30 tablet 1   ranolazine  (RANEXA ) 500 MG 12 hr tablet TAKE 1 TABLET BY MOUTH TWICE DAILY 60 tablet 10   rosuvastatin  (CRESTOR ) 20 MG tablet TAKE 1 TABLET BY MOUTH ONCE DAILY 90 tablet 1   sertraline  (ZOLOFT ) 100 MG tablet TAKE 1/2 TABLET(50 MG) BY MOUTH DAILY (Patient taking differently: Take 50 mg by mouth daily.) 45 tablet 1   tiZANidine  (ZANAFLEX ) 4 MG tablet Take 1 tablet (4 mg total) by mouth at bedtime. 30 tablet 3   topiramate  (TOPAMAX ) 50 MG tablet TAKE TWO (2) TABLETS BY MOUTH EVERY DAY AT BEDTIME (Patient taking differently: Take 50 mg by mouth at bedtime. TAKE TWO (2) TABLETS BY MOUTH EVERY DAY AT BEDTIME) 60 tablet 10   Ubrogepant  (UBRELVY ) 100 MG TABS Take 1 tablet (100 mg total) by mouth as needed (May repeat after 2 hours.  Maximum 2 tablets in 24 hours). TAKE 1  TABLET BY MOUTH BY MOUTH AS NEEDED( MAY REPEAT 1 TABLET AFTER 2 HOURS IF NEEDED, MAXIMUM 2 TABLETS IN 24 HOURS) Strength: 100 mg 48 tablet 1   Vitamin D , Ergocalciferol , (DRISDOL ) 1.25 MG (50000 UNIT) CAPS capsule TAKE 1 CAPSULE BY MOUTH EVERY 7 DAYS FOR 12 DOSES (Patient taking differently: Take 50,000 Units by mouth every 7 (seven) days. Patient usually takes this medication on mondays) 12 capsule 0   No current  facility-administered medications for this visit.   Facility-Administered Medications Ordered in Other Visits  Medication Dose Route Frequency Provider Last Rate Last Admin   0.9 %  sodium chloride  infusion   Intravenous Continuous Lowanda Ruddy, MD        VITAL SIGNS: There were no vitals taken for this visit. There were no vitals filed for this visit.  Estimated body mass index is 29.73 kg/m as calculated from the following:   Height as of 12/24/23: 4\' 10"  (1.473 m).   Weight as of 12/25/23: 142 lb 4 oz (64.5 kg).  Assessment NAD, resting in bed Normal breathing patter RRR AAO x3  PERFORMANCE STATUS (ECOG) : 3 - Symptomatic, >50% confined to bed  IMPRESSION: Discussed the use of AI scribe software for clinical note transcription with the patient, who gave verbal consent to proceed.  History of Present Illness [name] is a 68 year old female with chronic pain and recent diagnosis of metastatic bladder cancer with T5 bone involvement who presents for symptom management follow-up. She is accompanied by her daughter [name].    She has been experiencing persistent low blood pressure despite consuming four to six bottles of water  daily. Her daughter assists by emptying her drain bag twice a day, indicating adequate fluid intake, yet her blood pressure remains low.  She experiences intermittent diarrhea, which varies in severity. The diarrhea persisted even after temporarily discontinuing methadone , which was initially suspected to be the cause. She manages the diarrhea  with Imodium , taking two tablets after the first episode, which helps control the symptoms. Some days are better than others and patient can go without episodes.   Her appetite has slightly improved and she is eating better. Patient eating sandwich and chips during visit. Patient experiences nausea with fluctuating severity similar to diarrhea episodes. Some days are better than other but controlled with antiemetics including Reglan .   Hephzibah was recently started on Lyrica , which caused significant tremors, described as severe enough to drop objects. The tremors have lessened since discontinuing Lyrica , but some persist. Additionally, she experiences cognitive fog, with difficulty understanding questions and sometimes repeating herself, which has been present since the last visit. Daughter aware we will continue to closely monitor symptoms.  We discussed Ms. Binford's pain at length. She is on methadone  three times a day and oxycodone  as needed. Her daughter reports she was taking oxycodone  four times a day but has reduced the frequency due to concerns about running out. Despite this, she appears to be in pain but is hesitant to request medication.   I discussed the importance of continued conversation with family and their medical providers regarding overall plan of care and treatment options, ensuring decisions are within the context of the patients values and GOCs. Assessment & Plan Diarrhea Severe diarrhea persists despite Imodium , with dehydration risk due to limited intake. - Continue Imodium  as needed.       - Monitor for dehydration.  Nausea Persistent nausea limits intake; Reglan  provides partial relief. Compazine  unavailable, considering marijuana pills. - Administer Reglan  every 6 hours as needed. - Modify diet with broth and soup. BRAT diet.  Generalized weakness and fatigue Weakness and fatigue likely from poor nutrition and ongoing symptoms. - Encourage fluid intake.  Cancer  pain Tolerating current pain regimen which includes oxycodone  and methadone . Lyrica  discontinued due to side effects. - Continue oxycodone  every 4 to 6 hours as needed. - Continue methadone  every 8 hours.  Hypotension Low blood pressure with adequate hydration and  urine output. No specific cause identified. -IVF per [name]  Follow-up Scheduled to monitor progress and adjust treatment. - Follow up on Jan 15, 2024. - Review medication list for accuracy at next visit.  Patient expressed understanding and was in agreement with this plan. She also understands that She can call the clinic at any time with any questions, concerns, or complaints.   Any controlled substances utilized were prescribed in the context of palliative care. PDMP has been reviewed.   Visit consisted of counseling and education dealing with the complex and emotionally intense issues of symptom management and palliative care in the setting of serious and potentially life-threatening illness.  Dellia Ferguson, AGPCNP-BC  Palliative Medicine Team/[facility]

## 2024-01-15 NOTE — Assessment & Plan Note (Addendum)
 continue EV/P today Seeing palliative care today Follow up before C3 Will order PET scan after C3.

## 2024-01-15 NOTE — Progress Notes (Signed)
 [facility] OFFICE PROGRESS NOTE  Patient Care Team: Janece Means, FNP as PCP - General (Family Medicine) Lei Pump, MD as PCP - Electrophysiology (Cardiology) Manfred Seed, MD as PCP - Cardiology (Cardiology) Garry Kansas, MD as Consulting Physician (Neurosurgery) Manfred Seed, MD as Consulting Physician (Cardiology) Candyce Champagne, MD as Consulting Physician (General Surgery) Merriam Abbey, DO as Consulting Physician (Neurology) Steven Elam, [facility] (Inactive) as Pharmacist (Pharmacist) Genell Ken, MD as Consulting Physician (Gastroenterology) Zehr, Martina Sledge, PA-C as Physician Assistant (Gastroenterology) Cristi Donalds, MD as Consulting Physician (Nephrology) Pickenpack-Cousar, Giles Labrum, NP as Nurse Practitioner (Hospice and Palliative Medicine)  68 y.o.female with past medical history of COPD, chronic back from from DDD, osteoporosis with fractures presenting with back pain radiating down to the hip in March of 2025 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  Cystoscopy with TURBT show right sided bladder tumor with palpable anterior vaginal mass.  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    Current diagnosis: Stage IVB UC with bone metastases Treatment:  12/18/23 C1D1 EV/P   Clinically stable after C1. Discussed continue treatment today. Manage side effects as needed.   Confusion a/w pregabalin . Likely due to reduced renal clearance. Stopped.   Baseline edema unchanged after holding diuretics. Lung sounds clear today.  Addendum: After visit labs showed AKI. Will give one L of NS pretreatment today. Coordinated care with infusion nursing staffs for follow up. Scheduled for IVF and lab on Tue at [facility].  I called and spoke to patient on 5/24 morning. She reports feeling better. Having increase fluid and has good u/o. Significant other was with her. Some diarrhea.  Will use imodium  as needed.  With renal failure, and CKD, will obtain restaging PET instead of CT.  Called daughter but no answer for the update. Assessment & Plan Urothelial carcinoma of bladder [facility]) continue EV/P today Seeing palliative care today Follow up before C3 Will order PET scan after C3. Dietary folate deficiency anemia Folic acid  1 mg daily Low vitamin B12 level B12 1000 mcg daily Nausea and vomiting, unspecified vomiting type Compazine  and zofran  Hypotension, unspecified hypotension type Increase fluid intake. AKI (acute kidney injury) (HCC) IVF NS today Stop pregabalin . Reduced renal clearance. Increase fluid at home Follow up on Tue with lab and IVF in [place].  Orders Placed This Encounter  Procedures   NM PET Image Restag (PS) Skull Base To Thigh    Standing Status:   Future    Expected Date:   02/12/2024    Expiration Date:   01/15/2025    If indicated for the ordered procedure, I authorize the administration of a radiopharmaceutical per Radiology protocol:   Yes    Preferred imaging location?:   [facility]   I spent a total of 43 minutes including review of chart and various tests results, face-to-face time with the patient, discussions about results, plan of care and coordination of care plan with other providers and staff members. Communicated and follow up with family members.    Lowanda Ruddy, MD  INTERVAL HISTORY:  Patient returns for follow-up. Report of jerking movement at times, loose balance thought started after started lyrica  so she stopped it. Last dose was yesterday. Some n/v and diarrhea.  Mental status. Report she sometimes does not know what she is asking. She repeats herself several times.   Report drinking more fluid. She is eating more food. No trouble urinating.  No dizziness or lightheadedness.   Oncology History  Urothelial carcinoma of bladder (HCC)  11/18/2023 Initial Diagnosis   Urothelial carcinoma of bladder (HCC)    12/01/2023 Cancer Staging   Staging form: Urinary Bladder, AJCC 8th Edition - Clinical: Stage IVB (cTX, cNX, pM1b) - Signed by Lowanda Ruddy, MD on 12/01/2023 WHO/ISUP grade (low/high): High Grade Histologic grading system: 2 grade system   12/18/2023 -  Chemotherapy   Patient is on Treatment Plan : UROTHELIAL ADVANCED, METASTATIC ENFORTUMAB D1, D8 + PEMBROLIZUMAB  (200) D1 Q21D        PHYSICAL EXAMINATION: ECOG PERFORMANCE STATUS: 3 - Symptomatic, >50% confined to bed  Vitals:   01/15/24 1131  BP: (!) 91/46  Pulse: 69  Resp: 18  Temp: (!) 97.5 F (36.4 C)  SpO2: 93%    GENERAL: alert, no distress and comfortable EYES: normal, sclera clear LUNGS: clear to auscultation and percussion with normal breathing effort HEART: regular rate & rhythm  ABDOMEN: abdomen soft, non-tender and nondistended. Musculoskeletal: bilateral pitting edema NEURO: AO x 3 to person, year, location.  Relevant data reviewed during this visit included labs.

## 2024-01-15 NOTE — Assessment & Plan Note (Addendum)
 Compazine  and zofran 

## 2024-01-15 NOTE — Assessment & Plan Note (Addendum)
Folic acid 1mg daily

## 2024-01-15 NOTE — Patient Instructions (Signed)
 [facility] - A DEPT OF [facility]  Discharge Instructions: Thank you for choosing [facility] to provide your oncology and hematology care.   If you have a lab appointment with the Cancer Center, please go directly to the Cancer Center and check in at the registration area.   Wear comfortable clothing and clothing appropriate for easy access to any Portacath or PICC line.   We strive to give you quality time with your provider. You may need to reschedule your appointment if you arrive late (15 or more minutes).  Arriving late affects you and other patients whose appointments are after yours.  Also, if you miss three or more appointments without notifying the office, you may be dismissed from the clinic at the provider's discretion.      For prescription refill requests, have your pharmacy contact our office and allow 72 hours for refills to be completed.    Today you received the following chemotherapy and/or immunotherapy agents: Padcev.      To help prevent nausea and vomiting after your treatment, we encourage you to take your nausea medication as directed.  BELOW ARE SYMPTOMS THAT SHOULD BE REPORTED IMMEDIATELY: *FEVER GREATER THAN 100.4 F (38 C) OR HIGHER *CHILLS OR SWEATING *NAUSEA AND VOMITING THAT IS NOT CONTROLLED WITH YOUR NAUSEA MEDICATION *UNUSUAL SHORTNESS OF BREATH *UNUSUAL BRUISING OR BLEEDING *URINARY PROBLEMS (pain or burning when urinating, or frequent urination) *BOWEL PROBLEMS (unusual diarrhea, constipation, pain near the anus) TENDERNESS IN MOUTH AND THROAT WITH OR WITHOUT PRESENCE OF ULCERS (sore throat, sores in mouth, or a toothache) UNUSUAL RASH, SWELLING OR PAIN  UNUSUAL VAGINAL DISCHARGE OR ITCHING   Items with * indicate a potential emergency and should be followed up as soon as possible or go to the Emergency Department if any problems should occur.  Please show the CHEMOTHERAPY ALERT CARD or IMMUNOTHERAPY  ALERT CARD at check-in to the Emergency Department and triage nurse.  Should you have questions after your visit or need to cancel or reschedule your appointment, please contact [facility] - A DEPT OF [facility]  Dept: [phone]  and follow the prompts.  Office hours are 8:00 a.m. to 4:30 p.m. Monday - Friday. Please note that voicemails left after 4:00 p.m. may not be returned until the following business day.  We are closed weekends and major holidays. You have access to a nurse at all times for urgent questions. Please call the main number to the clinic Dept: [phone] and follow the prompts.   For any non-urgent questions, you may also contact your provider using MyChart. We now offer e-Visits for anyone 35 and older to request care online for non-urgent symptoms. For details visit [email or web address].   Also download the MyChart app! Go to the app store, search "MyChart", open the app, select [place], and log in with your MyChart username and password.

## 2024-01-15 NOTE — Assessment & Plan Note (Addendum)
 Increase fluid intake.

## 2024-01-16 DIAGNOSIS — D630 Anemia in neoplastic disease: Secondary | ICD-10-CM | POA: Diagnosis not present

## 2024-01-16 DIAGNOSIS — M4726 Other spondylosis with radiculopathy, lumbar region: Secondary | ICD-10-CM | POA: Diagnosis not present

## 2024-01-16 DIAGNOSIS — G8220 Paraplegia, unspecified: Secondary | ICD-10-CM | POA: Diagnosis not present

## 2024-01-16 DIAGNOSIS — N179 Acute kidney failure, unspecified: Secondary | ICD-10-CM | POA: Diagnosis not present

## 2024-01-16 DIAGNOSIS — E785 Hyperlipidemia, unspecified: Secondary | ICD-10-CM | POA: Diagnosis not present

## 2024-01-16 DIAGNOSIS — E440 Moderate protein-calorie malnutrition: Secondary | ICD-10-CM | POA: Diagnosis not present

## 2024-01-16 DIAGNOSIS — M8008XD Age-related osteoporosis with current pathological fracture, vertebra(e), subsequent encounter for fracture with routine healing: Secondary | ICD-10-CM | POA: Diagnosis not present

## 2024-01-16 DIAGNOSIS — F5101 Primary insomnia: Secondary | ICD-10-CM | POA: Diagnosis not present

## 2024-01-16 DIAGNOSIS — G8929 Other chronic pain: Secondary | ICD-10-CM | POA: Diagnosis not present

## 2024-01-16 DIAGNOSIS — J9611 Chronic respiratory failure with hypoxia: Secondary | ICD-10-CM | POA: Diagnosis not present

## 2024-01-16 DIAGNOSIS — I420 Dilated cardiomyopathy: Secondary | ICD-10-CM | POA: Diagnosis not present

## 2024-01-16 DIAGNOSIS — C679 Malignant neoplasm of bladder, unspecified: Secondary | ICD-10-CM | POA: Diagnosis not present

## 2024-01-16 DIAGNOSIS — N1330 Unspecified hydronephrosis: Secondary | ICD-10-CM | POA: Diagnosis not present

## 2024-01-16 DIAGNOSIS — D520 Dietary folate deficiency anemia: Secondary | ICD-10-CM | POA: Diagnosis not present

## 2024-01-16 DIAGNOSIS — D631 Anemia in chronic kidney disease: Secondary | ICD-10-CM | POA: Diagnosis not present

## 2024-01-16 DIAGNOSIS — M899 Disorder of bone, unspecified: Secondary | ICD-10-CM | POA: Diagnosis not present

## 2024-01-16 DIAGNOSIS — M069 Rheumatoid arthritis, unspecified: Secondary | ICD-10-CM | POA: Diagnosis not present

## 2024-01-16 DIAGNOSIS — D72829 Elevated white blood cell count, unspecified: Secondary | ICD-10-CM | POA: Diagnosis not present

## 2024-01-16 DIAGNOSIS — M47814 Spondylosis without myelopathy or radiculopathy, thoracic region: Secondary | ICD-10-CM | POA: Diagnosis not present

## 2024-01-16 DIAGNOSIS — N1832 Chronic kidney disease, stage 3b: Secondary | ICD-10-CM | POA: Diagnosis not present

## 2024-01-16 DIAGNOSIS — E611 Iron deficiency: Secondary | ICD-10-CM | POA: Diagnosis not present

## 2024-01-16 DIAGNOSIS — J449 Chronic obstructive pulmonary disease, unspecified: Secondary | ICD-10-CM | POA: Diagnosis not present

## 2024-01-16 DIAGNOSIS — I5042 Chronic combined systolic (congestive) and diastolic (congestive) heart failure: Secondary | ICD-10-CM | POA: Diagnosis not present

## 2024-01-16 NOTE — Progress Notes (Signed)
 Remote ICD transmission.

## 2024-01-16 NOTE — Assessment & Plan Note (Addendum)
 IVF NS today Stop pregabalin . Reduced renal clearance. Increase fluid at home Follow up on Tue with lab and IVF in [place].

## 2024-01-20 ENCOUNTER — Inpatient Hospital Stay

## 2024-01-20 ENCOUNTER — Telehealth: Payer: Self-pay

## 2024-01-20 NOTE — Telephone Encounter (Signed)
 Called and spoke to patient's daughter. Report of diarrhea this morning and cannot make it to her appointment. Report she was ok over the weekend. Able to eat and drink and have good urine outpatient. Report she was improving and her mental status also was fine without further confusion. She is able to keep fluid down and imodium  works for her. No other acute infectious symptoms.  Advised daughter to keep up with oral fluid to replace loss from diarrhea. If unable to tolerating po intake, worsening diarrhea, confusion, signs of dehydration, lack of urine output, signs of deterioration report to ED immediately. She understands. Advised to call if any new questions.

## 2024-01-21 DIAGNOSIS — M8008XD Age-related osteoporosis with current pathological fracture, vertebra(e), subsequent encounter for fracture with routine healing: Secondary | ICD-10-CM | POA: Diagnosis not present

## 2024-01-21 DIAGNOSIS — D72829 Elevated white blood cell count, unspecified: Secondary | ICD-10-CM | POA: Diagnosis not present

## 2024-01-21 DIAGNOSIS — I5042 Chronic combined systolic (congestive) and diastolic (congestive) heart failure: Secondary | ICD-10-CM | POA: Diagnosis not present

## 2024-01-21 DIAGNOSIS — M899 Disorder of bone, unspecified: Secondary | ICD-10-CM | POA: Diagnosis not present

## 2024-01-21 DIAGNOSIS — C679 Malignant neoplasm of bladder, unspecified: Secondary | ICD-10-CM | POA: Diagnosis not present

## 2024-01-21 DIAGNOSIS — E785 Hyperlipidemia, unspecified: Secondary | ICD-10-CM | POA: Diagnosis not present

## 2024-01-21 DIAGNOSIS — J449 Chronic obstructive pulmonary disease, unspecified: Secondary | ICD-10-CM | POA: Diagnosis not present

## 2024-01-21 DIAGNOSIS — N1330 Unspecified hydronephrosis: Secondary | ICD-10-CM | POA: Diagnosis not present

## 2024-01-21 DIAGNOSIS — E440 Moderate protein-calorie malnutrition: Secondary | ICD-10-CM | POA: Diagnosis not present

## 2024-01-21 DIAGNOSIS — M4726 Other spondylosis with radiculopathy, lumbar region: Secondary | ICD-10-CM | POA: Diagnosis not present

## 2024-01-21 DIAGNOSIS — D630 Anemia in neoplastic disease: Secondary | ICD-10-CM | POA: Diagnosis not present

## 2024-01-21 DIAGNOSIS — G8220 Paraplegia, unspecified: Secondary | ICD-10-CM | POA: Diagnosis not present

## 2024-01-21 DIAGNOSIS — N179 Acute kidney failure, unspecified: Secondary | ICD-10-CM | POA: Diagnosis not present

## 2024-01-21 DIAGNOSIS — D520 Dietary folate deficiency anemia: Secondary | ICD-10-CM | POA: Diagnosis not present

## 2024-01-21 DIAGNOSIS — D631 Anemia in chronic kidney disease: Secondary | ICD-10-CM | POA: Diagnosis not present

## 2024-01-21 DIAGNOSIS — J9611 Chronic respiratory failure with hypoxia: Secondary | ICD-10-CM | POA: Diagnosis not present

## 2024-01-21 DIAGNOSIS — M069 Rheumatoid arthritis, unspecified: Secondary | ICD-10-CM | POA: Diagnosis not present

## 2024-01-21 DIAGNOSIS — E611 Iron deficiency: Secondary | ICD-10-CM | POA: Diagnosis not present

## 2024-01-21 DIAGNOSIS — N1832 Chronic kidney disease, stage 3b: Secondary | ICD-10-CM | POA: Diagnosis not present

## 2024-01-21 DIAGNOSIS — G8929 Other chronic pain: Secondary | ICD-10-CM | POA: Diagnosis not present

## 2024-01-21 DIAGNOSIS — I420 Dilated cardiomyopathy: Secondary | ICD-10-CM | POA: Diagnosis not present

## 2024-01-21 DIAGNOSIS — M47814 Spondylosis without myelopathy or radiculopathy, thoracic region: Secondary | ICD-10-CM | POA: Diagnosis not present

## 2024-01-21 DIAGNOSIS — F5101 Primary insomnia: Secondary | ICD-10-CM | POA: Diagnosis not present

## 2024-01-24 DIAGNOSIS — G8929 Other chronic pain: Secondary | ICD-10-CM | POA: Diagnosis not present

## 2024-01-24 DIAGNOSIS — C679 Malignant neoplasm of bladder, unspecified: Secondary | ICD-10-CM | POA: Diagnosis not present

## 2024-01-24 DIAGNOSIS — G8220 Paraplegia, unspecified: Secondary | ICD-10-CM | POA: Diagnosis not present

## 2024-01-24 DIAGNOSIS — J9611 Chronic respiratory failure with hypoxia: Secondary | ICD-10-CM | POA: Diagnosis not present

## 2024-01-24 DIAGNOSIS — M899 Disorder of bone, unspecified: Secondary | ICD-10-CM | POA: Diagnosis not present

## 2024-01-24 DIAGNOSIS — I5042 Chronic combined systolic (congestive) and diastolic (congestive) heart failure: Secondary | ICD-10-CM | POA: Diagnosis not present

## 2024-01-24 DIAGNOSIS — I420 Dilated cardiomyopathy: Secondary | ICD-10-CM | POA: Diagnosis not present

## 2024-01-24 DIAGNOSIS — E611 Iron deficiency: Secondary | ICD-10-CM | POA: Diagnosis not present

## 2024-01-24 DIAGNOSIS — D72829 Elevated white blood cell count, unspecified: Secondary | ICD-10-CM | POA: Diagnosis not present

## 2024-01-24 DIAGNOSIS — M47814 Spondylosis without myelopathy or radiculopathy, thoracic region: Secondary | ICD-10-CM | POA: Diagnosis not present

## 2024-01-24 DIAGNOSIS — F5101 Primary insomnia: Secondary | ICD-10-CM | POA: Diagnosis not present

## 2024-01-24 DIAGNOSIS — M069 Rheumatoid arthritis, unspecified: Secondary | ICD-10-CM | POA: Diagnosis not present

## 2024-01-24 DIAGNOSIS — D631 Anemia in chronic kidney disease: Secondary | ICD-10-CM | POA: Diagnosis not present

## 2024-01-24 DIAGNOSIS — E440 Moderate protein-calorie malnutrition: Secondary | ICD-10-CM | POA: Diagnosis not present

## 2024-01-24 DIAGNOSIS — M8008XD Age-related osteoporosis with current pathological fracture, vertebra(e), subsequent encounter for fracture with routine healing: Secondary | ICD-10-CM | POA: Diagnosis not present

## 2024-01-24 DIAGNOSIS — E785 Hyperlipidemia, unspecified: Secondary | ICD-10-CM | POA: Diagnosis not present

## 2024-01-24 DIAGNOSIS — D520 Dietary folate deficiency anemia: Secondary | ICD-10-CM | POA: Diagnosis not present

## 2024-01-24 DIAGNOSIS — N1330 Unspecified hydronephrosis: Secondary | ICD-10-CM | POA: Diagnosis not present

## 2024-01-24 DIAGNOSIS — J449 Chronic obstructive pulmonary disease, unspecified: Secondary | ICD-10-CM | POA: Diagnosis not present

## 2024-01-24 DIAGNOSIS — N179 Acute kidney failure, unspecified: Secondary | ICD-10-CM | POA: Diagnosis not present

## 2024-01-24 DIAGNOSIS — D630 Anemia in neoplastic disease: Secondary | ICD-10-CM | POA: Diagnosis not present

## 2024-01-24 DIAGNOSIS — N1832 Chronic kidney disease, stage 3b: Secondary | ICD-10-CM | POA: Diagnosis not present

## 2024-01-24 DIAGNOSIS — M4726 Other spondylosis with radiculopathy, lumbar region: Secondary | ICD-10-CM | POA: Diagnosis not present

## 2024-01-27 ENCOUNTER — Telehealth: Payer: Self-pay | Admitting: *Deleted

## 2024-01-27 NOTE — Telephone Encounter (Signed)
 [name] from [facility] called regarding foley catheter issues. States the bag is "turing purple and a lot of times that indicates pseudomonas". Nephrostomy tube is capped.   RN called Dr Farris Hong nurse at [facility]. They state that she was a "no show" for Dr Secundino Dach on 4/14 and 5/6. RN states that the foley bag can change color due to medication. They are going to call the daughter to make an appt for follow up ASAP

## 2024-01-27 NOTE — Telephone Encounter (Signed)
 Spoke with daughter regarding sore on leg. States the site has popped and looked better. Plan to see on Thursday as scheduled. Also states that cough has gotten better. Has an appt with nephrologist in next week re: stent.

## 2024-01-28 DIAGNOSIS — I420 Dilated cardiomyopathy: Secondary | ICD-10-CM | POA: Diagnosis not present

## 2024-01-28 DIAGNOSIS — J9611 Chronic respiratory failure with hypoxia: Secondary | ICD-10-CM | POA: Diagnosis not present

## 2024-01-28 DIAGNOSIS — M4726 Other spondylosis with radiculopathy, lumbar region: Secondary | ICD-10-CM | POA: Diagnosis not present

## 2024-01-28 DIAGNOSIS — M069 Rheumatoid arthritis, unspecified: Secondary | ICD-10-CM | POA: Diagnosis not present

## 2024-01-28 DIAGNOSIS — D631 Anemia in chronic kidney disease: Secondary | ICD-10-CM | POA: Diagnosis not present

## 2024-01-28 DIAGNOSIS — M8008XD Age-related osteoporosis with current pathological fracture, vertebra(e), subsequent encounter for fracture with routine healing: Secondary | ICD-10-CM | POA: Diagnosis not present

## 2024-01-28 DIAGNOSIS — C679 Malignant neoplasm of bladder, unspecified: Secondary | ICD-10-CM | POA: Diagnosis not present

## 2024-01-28 DIAGNOSIS — D520 Dietary folate deficiency anemia: Secondary | ICD-10-CM | POA: Diagnosis not present

## 2024-01-28 DIAGNOSIS — G8220 Paraplegia, unspecified: Secondary | ICD-10-CM | POA: Diagnosis not present

## 2024-01-28 DIAGNOSIS — M899 Disorder of bone, unspecified: Secondary | ICD-10-CM | POA: Diagnosis not present

## 2024-01-28 DIAGNOSIS — E440 Moderate protein-calorie malnutrition: Secondary | ICD-10-CM | POA: Diagnosis not present

## 2024-01-28 DIAGNOSIS — M47814 Spondylosis without myelopathy or radiculopathy, thoracic region: Secondary | ICD-10-CM | POA: Diagnosis not present

## 2024-01-28 DIAGNOSIS — N1832 Chronic kidney disease, stage 3b: Secondary | ICD-10-CM | POA: Diagnosis not present

## 2024-01-28 DIAGNOSIS — D630 Anemia in neoplastic disease: Secondary | ICD-10-CM | POA: Diagnosis not present

## 2024-01-28 DIAGNOSIS — Z936 Other artificial openings of urinary tract status: Secondary | ICD-10-CM | POA: Diagnosis not present

## 2024-01-28 DIAGNOSIS — F5101 Primary insomnia: Secondary | ICD-10-CM | POA: Diagnosis not present

## 2024-01-28 DIAGNOSIS — I2510 Atherosclerotic heart disease of native coronary artery without angina pectoris: Secondary | ICD-10-CM | POA: Diagnosis not present

## 2024-01-28 DIAGNOSIS — L89312 Pressure ulcer of right buttock, stage 2: Secondary | ICD-10-CM | POA: Diagnosis not present

## 2024-01-28 DIAGNOSIS — I5042 Chronic combined systolic (congestive) and diastolic (congestive) heart failure: Secondary | ICD-10-CM | POA: Diagnosis not present

## 2024-01-28 DIAGNOSIS — J4489 Other specified chronic obstructive pulmonary disease: Secondary | ICD-10-CM | POA: Diagnosis not present

## 2024-01-28 DIAGNOSIS — N1330 Unspecified hydronephrosis: Secondary | ICD-10-CM | POA: Diagnosis not present

## 2024-01-28 DIAGNOSIS — E785 Hyperlipidemia, unspecified: Secondary | ICD-10-CM | POA: Diagnosis not present

## 2024-01-29 ENCOUNTER — Other Ambulatory Visit

## 2024-01-29 ENCOUNTER — Inpatient Hospital Stay

## 2024-01-29 ENCOUNTER — Inpatient Hospital Stay (HOSPITAL_BASED_OUTPATIENT_CLINIC_OR_DEPARTMENT_OTHER): Admitting: Nurse Practitioner

## 2024-01-29 ENCOUNTER — Other Ambulatory Visit: Payer: Self-pay | Admitting: *Deleted

## 2024-01-29 ENCOUNTER — Inpatient Hospital Stay: Admitting: Nutrition

## 2024-01-29 ENCOUNTER — Ambulatory Visit

## 2024-01-29 ENCOUNTER — Encounter: Payer: Self-pay | Admitting: Nurse Practitioner

## 2024-01-29 VITALS — BP 136/99 | HR 108 | Temp 97.7°F | Resp 16

## 2024-01-29 DIAGNOSIS — M79672 Pain in left foot: Secondary | ICD-10-CM | POA: Diagnosis not present

## 2024-01-29 DIAGNOSIS — M79671 Pain in right foot: Secondary | ICD-10-CM | POA: Diagnosis not present

## 2024-01-29 DIAGNOSIS — R5383 Other fatigue: Secondary | ICD-10-CM | POA: Diagnosis not present

## 2024-01-29 DIAGNOSIS — N1831 Chronic kidney disease, stage 3a: Secondary | ICD-10-CM | POA: Insufficient documentation

## 2024-01-29 DIAGNOSIS — C679 Malignant neoplasm of bladder, unspecified: Secondary | ICD-10-CM

## 2024-01-29 DIAGNOSIS — M8458XA Pathological fracture in neoplastic disease, other specified site, initial encounter for fracture: Secondary | ICD-10-CM

## 2024-01-29 DIAGNOSIS — Z79891 Long term (current) use of opiate analgesic: Secondary | ICD-10-CM | POA: Diagnosis not present

## 2024-01-29 DIAGNOSIS — R531 Weakness: Secondary | ICD-10-CM | POA: Diagnosis not present

## 2024-01-29 DIAGNOSIS — G629 Polyneuropathy, unspecified: Secondary | ICD-10-CM | POA: Diagnosis not present

## 2024-01-29 DIAGNOSIS — G893 Neoplasm related pain (acute) (chronic): Secondary | ICD-10-CM | POA: Diagnosis not present

## 2024-01-29 DIAGNOSIS — C7951 Secondary malignant neoplasm of bone: Secondary | ICD-10-CM | POA: Diagnosis not present

## 2024-01-29 DIAGNOSIS — D649 Anemia, unspecified: Secondary | ICD-10-CM

## 2024-01-29 DIAGNOSIS — Z87891 Personal history of nicotine dependence: Secondary | ICD-10-CM | POA: Diagnosis not present

## 2024-01-29 DIAGNOSIS — R630 Anorexia: Secondary | ICD-10-CM

## 2024-01-29 DIAGNOSIS — R110 Nausea: Secondary | ICD-10-CM | POA: Diagnosis not present

## 2024-01-29 DIAGNOSIS — J9611 Chronic respiratory failure with hypoxia: Secondary | ICD-10-CM | POA: Insufficient documentation

## 2024-01-29 DIAGNOSIS — R530 Neoplastic (malignant) related fatigue: Secondary | ICD-10-CM | POA: Diagnosis not present

## 2024-01-29 DIAGNOSIS — Z515 Encounter for palliative care: Secondary | ICD-10-CM

## 2024-01-29 DIAGNOSIS — Z5112 Encounter for antineoplastic immunotherapy: Secondary | ICD-10-CM | POA: Diagnosis not present

## 2024-01-29 DIAGNOSIS — Z79620 Long term (current) use of immunosuppressive biologic: Secondary | ICD-10-CM | POA: Diagnosis not present

## 2024-01-29 DIAGNOSIS — D520 Dietary folate deficiency anemia: Secondary | ICD-10-CM

## 2024-01-29 DIAGNOSIS — R197 Diarrhea, unspecified: Secondary | ICD-10-CM | POA: Insufficient documentation

## 2024-01-29 DIAGNOSIS — I5022 Chronic systolic (congestive) heart failure: Secondary | ICD-10-CM | POA: Diagnosis not present

## 2024-01-29 DIAGNOSIS — N179 Acute kidney failure, unspecified: Secondary | ICD-10-CM

## 2024-01-29 DIAGNOSIS — R634 Abnormal weight loss: Secondary | ICD-10-CM

## 2024-01-29 DIAGNOSIS — G4700 Insomnia, unspecified: Secondary | ICD-10-CM | POA: Diagnosis not present

## 2024-01-29 HISTORY — DX: Nausea: R11.0

## 2024-01-29 HISTORY — DX: Polyneuropathy, unspecified: G62.9

## 2024-01-29 LAB — T4, FREE: Free T4: 1.22 ng/dL — ABNORMAL HIGH (ref 0.61–1.12)

## 2024-01-29 LAB — CMP (CANCER CENTER ONLY)
ALT: 7 U/L (ref 0–44)
AST: 19 U/L (ref 15–41)
Albumin: 3 g/dL — ABNORMAL LOW (ref 3.5–5.0)
Alkaline Phosphatase: 90 U/L (ref 38–126)
Anion gap: 6 (ref 5–15)
BUN: 18 mg/dL (ref 8–23)
CO2: 24 mmol/L (ref 22–32)
Calcium: 9 mg/dL (ref 8.9–10.3)
Chloride: 108 mmol/L (ref 98–111)
Creatinine: 1.27 mg/dL — ABNORMAL HIGH (ref 0.44–1.00)
GFR, Estimated: 46 mL/min — ABNORMAL LOW (ref >60)
Glucose, Bld: 106 mg/dL — ABNORMAL HIGH (ref 70–99)
Potassium: 3.4 mmol/L — ABNORMAL LOW (ref 3.5–5.1)
Sodium: 138 mmol/L (ref 135–145)
Total Bilirubin: 0.4 mg/dL (ref 0.0–1.2)
Total Protein: 6.4 g/dL — ABNORMAL LOW (ref 6.5–8.1)

## 2024-01-29 LAB — CBC WITH DIFFERENTIAL (CANCER CENTER ONLY)
Abs Immature Granulocytes: 0.11 10*3/uL — ABNORMAL HIGH (ref 0.00–0.07)
Basophils Absolute: 0.1 10*3/uL (ref 0.0–0.1)
Basophils Relative: 2 %
Eosinophils Absolute: 0.4 10*3/uL (ref 0.0–0.5)
Eosinophils Relative: 5 %
HCT: 28 % — ABNORMAL LOW (ref 36.0–46.0)
Hemoglobin: 9.3 g/dL — ABNORMAL LOW (ref 12.0–15.0)
Immature Granulocytes: 2 %
Lymphocytes Relative: 18 %
Lymphs Abs: 1.3 10*3/uL (ref 0.7–4.0)
MCH: 30.3 pg (ref 26.0–34.0)
MCHC: 33.2 g/dL (ref 30.0–36.0)
MCV: 91.2 fL (ref 80.0–100.0)
Monocytes Absolute: 1.2 10*3/uL — ABNORMAL HIGH (ref 0.1–1.0)
Monocytes Relative: 17 %
Neutro Abs: 4 10*3/uL (ref 1.7–7.7)
Neutrophils Relative %: 56 %
Platelet Count: 359 10*3/uL (ref 150–400)
RBC: 3.07 MIL/uL — ABNORMAL LOW (ref 3.87–5.11)
RDW: 17 % — ABNORMAL HIGH (ref 11.5–15.5)
WBC Count: 7.2 10*3/uL (ref 4.0–10.5)
nRBC: 0 % (ref 0.0–0.2)

## 2024-01-29 LAB — SAMPLE TO BLOOD BANK

## 2024-01-29 LAB — TSH: TSH: 1.49 u[IU]/mL (ref 0.350–4.500)

## 2024-01-29 LAB — MAGNESIUM: Magnesium: 1.8 mg/dL (ref 1.7–2.4)

## 2024-01-29 MED ORDER — SODIUM CHLORIDE 0.9 % IV SOLN: INTRAVENOUS | Status: DC

## 2024-01-29 MED ORDER — OXYCODONE HCL 20 MG PO TABS: 20.0000 mg | ORAL_TABLET | ORAL | 0 refills | Status: DC | PRN

## 2024-01-29 MED ORDER — SODIUM CHLORIDE 0.9 % IV SOLN
200.0000 mg | Freq: Once | INTRAVENOUS | Status: AC
  Administered 2024-01-29: 200 mg via INTRAVENOUS
  Filled 2024-01-29: qty 200

## 2024-01-29 MED ORDER — SODIUM CHLORIDE 0.9% FLUSH
10.0000 mL | INTRAVENOUS | Status: DC | PRN
  Administered 2024-01-29: 10 mL

## 2024-01-29 MED ORDER — SODIUM CHLORIDE 0.9% FLUSH
10.0000 mL | Freq: Once | INTRAVENOUS | Status: AC
  Administered 2024-01-29: 10 mL

## 2024-01-29 MED ORDER — HEPARIN SOD (PORK) LOCK FLUSH 100 UNIT/ML IV SOLN
500.0000 [IU] | Freq: Once | INTRAVENOUS | Status: AC | PRN
  Administered 2024-01-29: 500 [IU]

## 2024-01-29 MED ORDER — PROCHLORPERAZINE MALEATE 10 MG PO TABS
10.0000 mg | ORAL_TABLET | Freq: Once | ORAL | Status: AC
  Administered 2024-01-29: 10 mg via ORAL
  Filled 2024-01-29: qty 1

## 2024-01-29 NOTE — Progress Notes (Signed)
 [facility] OFFICE PROGRESS NOTE  Patient Care Team: Janece Means, FNP as PCP - General (Family Medicine) Lei Pump, MD as PCP - Electrophysiology (Cardiology) Manfred Seed, MD as PCP - Cardiology (Cardiology) Garry Kansas, MD as Consulting Physician (Neurosurgery) Manfred Seed, MD as Consulting Physician (Cardiology) Candyce Champagne, MD as Consulting Physician (General Surgery) Merriam Abbey, DO as Consulting Physician (Neurology) Steven Elam, [facility] (Inactive) as Pharmacist (Pharmacist) Genell Ken, MD as Consulting Physician (Gastroenterology) Zehr, Martina Sledge, PA-C as Physician Assistant (Gastroenterology) Cristi Donalds, MD as Consulting Physician (Nephrology) Pickenpack-Cousar, Giles Labrum, NP as Nurse Practitioner (Hospice and Palliative Medicine)  68 y.o.female with past medical history of COPD, chronic back from from DDD, osteoporosis with fractures presenting with back pain radiating down to the hip in March of 2025 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  Cystoscopy with TURBT show right sided bladder tumor with palpable anterior vaginal mass.  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    Current diagnosis: Stage IVB UC with bone metastases Treatment:  12/18/23 C1D1 EV/P   Daughter observed functional improvement at home.   Baseline edema unchanged after holding diuretics. Lung sounds clear today.  Seeing PT about 1-2x/week and able to stand and walk at home.  Abdominal pain, diarrhea, nausea, neuropathy. Will hold EV today.   If developing new fever, chills/rigors, with abdominal pain, report to ED to rule out infectious process. Assessment & Plan Urothelial carcinoma of bladder (HCC) continue Pembro today and hold EV for neuropathy Seeing palliative care as well for chronic pain management Follow up next week PET scan on 6/19 scheduled. Chronic systolic  congestive heart failure, NYHA class 2 (HCC) Clinically stable. Last EF 55 to 60%  Continue adequate fluid as able.  Hold diuretics and use as needed if increase edema Normocytic anemia Improved today Monitor with each treatment AKI (acute kidney injury) (HCC) AKI on CKD Increase fluid Repeat next week. Dietary folate deficiency anemia Folic acid  1 mg daily Diarrhea, unspecified type Worsening this week even with imodium  and lomotil  Will give one L of NS today. Nausea without vomiting Zofran  help. Compazine  as needed. Neuropathy Hold EV this week.  I spent a total of 41 minutes including review of chart and various tests results, face-to-face time with the patient, discussions about results, plan of care and coordination of care plan with other providers and staff members.    Lowanda Ruddy, MD  INTERVAL HISTORY: Patient returns for follow-up.  Report some diarrhea about 3 times a day. She has stomach pain, no fever or chill.   Good u/o from foley not using nephrostomy tube. No bloody urine.  She drinks about 4-5 bottle of water .  Oncology History  Urothelial carcinoma of bladder (HCC)  11/18/2023 Initial Diagnosis   Urothelial carcinoma of bladder (HCC)   12/01/2023 Cancer Staging   Staging form: Urinary Bladder, AJCC 8th Edition - Clinical: Stage IVB (cTX, cNX, pM1b) - Signed by Lowanda Ruddy, MD on 12/01/2023 WHO/ISUP grade (low/high): High Grade Histologic grading system: 2 grade system   12/18/2023 -  Chemotherapy   Patient is on Treatment Plan : UROTHELIAL ADVANCED, METASTATIC ENFORTUMAB D1, D8 + PEMBROLIZUMAB  (200) D1 Q21D        PHYSICAL EXAMINATION: ECOG PERFORMANCE STATUS: 3 - Symptomatic, >50% confined to bed  Vitals:   01/29/24 1215  BP: (!) 136/99  Pulse: (!) 108  Resp: 16  Temp: 97.7 F (36.5  C)  SpO2: 97%   Filed Weights    GENERAL: alert, no distress and comfortable SKIN: skin color normal and no jaundice on exposed skin EYES: , sclera  clear LUNGS: mild crackles at the base, normal breathing effort HEART: tachycardic ABDOMEN: abdomen soft, non-tender and nondistended. Foley in place Musculoskeletal: bilateral pitting edema  Relevant data reviewed during this visit included labs.

## 2024-01-29 NOTE — Assessment & Plan Note (Signed)
 Zofran  help. Compazine  as needed.

## 2024-01-29 NOTE — Patient Instructions (Signed)

## 2024-01-29 NOTE — Progress Notes (Signed)
 Palliative Medicine [facility]  Telephone:([phone]) [phone] [phone]   Name: [name] Date: 01/29/2024 MRN: [id number]  DOB: 12/19/1955  Patient Care Team: Janece Means, FNP as PCP - General (Family Medicine) Lei Pump, MD as PCP - Electrophysiology (Cardiology) Manfred Seed, MD as PCP - Cardiology (Cardiology) Garry Kansas, MD as Consulting Physician (Neurosurgery) Manfred Seed, MD as Consulting Physician (Cardiology) Candyce Champagne, MD as Consulting Physician (General Surgery) Merriam Abbey, DO as Consulting Physician (Neurology) Steven Elam, [facility] (Inactive) as Pharmacist (Pharmacist) Genell Ken, MD as Consulting Physician (Gastroenterology) Zehr, Martina Sledge, PA-C as Physician Assistant (Gastroenterology) Cristi Donalds, MD as Consulting Physician (Nephrology) Pickenpack-Cousar, Giles Labrum, NP as Nurse Practitioner (Hospice and Palliative Medicine)    INTERVAL HISTORY: [name] is a 68 y.o. female with oncologic medical history including metastatic bladder cancer with T5 bone metastasis, COPD, chronic back pain from DDD, osteoporosis with fractures, chronic pain management at [facility].  Palliative is seeing patient for symptom management and goals of care.   SOCIAL HISTORY:     reports that she quit smoking about 3 years ago. Her smoking use included cigarettes. She started smoking about 33 years ago. She has a 15 pack-year smoking history. She has never used smokeless tobacco. She reports current alcohol use of about 2.0 standard drinks of alcohol per week. She reports that she does not use drugs.  ADVANCE DIRECTIVES:  None on file   CODE STATUS: Full code  PAST MEDICAL HISTORY: Past Medical History:  Diagnosis Date   Abdominal pain 10/17/2020   Abnormality of gait due to impairment of balance 11/22/2020   Acute diverticulitis 10/17/2020   Admission for long-term opiate analgesic use 10/24/2019    Arthritis of right hip 05/29/2016   Formatting of this note might be different from the original. Added automatically from request for surgery [id number]   BMI 26.0-26.9,adult 03/29/2020   Bronchial asthma    Cardiomyopathy (HCC)    Overview:  Ejection fraction 45% in 2015 Ejection fraction 30 to 35% in November 2018   Chronic back pain    Chronic constipation 07/31/2021   Chronic hypoxemic respiratory failure (HCC) 10/24/2019   Chronic narcotic use 03/23/2015   Chronic pain of both knees 12/21/2018   Added automatically from request for surgery [id number]  Formatting of this note might be different from the original. 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   Hypotension 05/17/2020   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   Ileus following gastrointestinal surgery (HCC) 03/26/2015   Major depressive disorder, single episode, moderate (HCC) 10/24/2019   Malnutrition of moderate degree (HCC) 10/20/2020   Mesenteric ischemia (HCC) 10/16/2020   Migraine without aura with status migrainosus 10/24/2019   Mixed incontinence 10/20/2020   Myocardial infarction (HCC)  Nausea & vomiting    Obstructive chronic bronchitis with exacerbation (HCC)  10/25/2019   Other spondylosis with radiculopathy, lumbar region 10/24/2019   Persistent vomiting 05/17/2020   Presence of left artificial hip joint 10/24/2019   PTSD (post-traumatic stress disorder)    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 03/23/2015 04/19/2014   Senile osteoporosis 10/24/2019   Sepsis (HCC) 10/17/2020   Serosanguineous chronic otitis media of right ear 08/02/2021   Small bowel obstruction (HCC)    Thyroid  disease    Upper respiratory tract infection due to COVID-19 virus 07/06/2021   Wellness examination 03/27/2021    ALLERGIES:  is allergic to lyrica  [pregabalin ].  MEDICATIONS:  Current Outpatient Medications  Medication Sig Dispense Refill   Oxycodone  HCl 20 MG TABS Take 1 tablet (20 mg total) by mouth every 4 (four) hours as needed. 90 tablet 0   ARIPiprazole  (ABILIFY ) 5 MG tablet TAKE 1 TABLET BY MOUTH DAILY 30 tablet 10   aspirin  81 MG tablet Take 81 mg by mouth daily.     BREO ELLIPTA  100-25 MCG/ACT AEPB INHALE 1 PUFF INTO THE LUNGS DAILY. (Patient taking differently: Inhale 1 puff into the lungs daily as needed (SOB).) 60 each 10   Carboxymethylcell-Glycerin  PF 0.5-0.9 % SOLN Place 2 drops into both eyes 4 (four) times daily. 1 each 11   carvedilol  (COREG ) 3.125 MG tablet TAKE ONE (1) TABLET BY MOUTH TWICE DAILY WITH MEALS (Patient taking differently: Take 3.125 mg by mouth 2 (two) times daily with a meal.) 60 tablet 10   denosumab  (PROLIA ) 60 MG/ML SOSY injection Inject 60 mg into the skin every 6 (six) months. 180 mL 2   dextromethorphan  15 MG/5ML syrup Take 10 mLs (30 mg total) by mouth 4 (four) times daily as needed for cough. 120 mL 0   diclofenac  Sodium (VOLTAREN ) 1 % GEL Apply 2 g topically 4 (four) times daily. (Patient taking differently: Apply 2 g topically as needed (Pain).) 50 g 4   diphenoxylate -atropine  (LOMOTIL ) 2.5-0.025 MG tablet Take 1 tablet by mouth 4 (four) times daily as needed for diarrhea or loose stools. 60  tablet 0   dronabinol  (MARINOL ) 5 MG capsule Take 1 capsule (5 mg total) by mouth 2 (two) times daily before a meal. 60 capsule 0   ENTRESTO  24-26 MG TAKE ONE (1) TABLET BY MOUTH TWICE DAILY (Patient taking differently: Take 1 tablet by mouth 2 (two) times daily.) 60 tablet 10   EPINEPHRINE  0.3 mg/0.3 mL IJ SOAJ injection Inject 0.3 mLs (0.3 mg total) into the muscle as needed for anaphylaxis. 1 each 2   esomeprazole  (NEXIUM ) 20 MG capsule TAKE ONE (1) CAPSULE BY MOUTH ONCE DAILY (Patient taking differently: Take 20 mg by mouth daily at 12 noon.) 90 capsule 1   FARXIGA 5 MG TABS tablet Take 5 mg by mouth daily.     ferrous sulfate  325 (65 FE) MG tablet Take 1 tablet (325 mg total) by mouth at bedtime.     fluticasone  (FLONASE ) 50 MCG/ACT nasal spray USE 2 SPRAYS IN EACH NOSTRILS DAILY AS NEEDED (Patient taking differently: Place 2 sprays into both nostrils daily as needed for allergies or rhinitis.) 48 g 0   folic acid  (FOLVITE ) 1 MG tablet Take 1 tablet (1 mg total) by mouth daily.     ipratropium-albuterol  (DUONEB) 0.5-2.5 (3) MG/3ML SOLN USE 1 VIAL VIA NEBULIZER EVERY 6 HOURS AS NEEDED FOR SHORTNESS OF BREATH (Patient taking differently: Take 3 mLs by nebulization every 6 (six)  hours as needed (SOB).) 90 mL 0   levocetirizine (XYZAL) 5 MG tablet Take 5 mg by mouth daily as needed for allergies.     lidocaine  (LIDODERM ) 5 % Place 1 patch onto the skin daily. Remove & Discard patch within 12 hours or as directed by MD     lidocaine -prilocaine  (EMLA ) cream Apply to affected area once (Patient taking differently: Apply 1 Application topically once. Apply to affected area once) 30 g 3   lubiprostone  (AMITIZA ) 24 MCG capsule TAKE 1 CAPSULE(24 MCG) BY MOUTH TWICE DAILY WITH A MEAL (Patient taking differently: Take 24 mcg by mouth 2 (two) times daily with a meal.) 180 capsule 1   methadone  (DOLOPHINE ) 10 MG tablet Take 1 tablet (10 mg total) by mouth every 8 (eight) hours. 90 tablet 0   methylPREDNISolone   (MEDROL  DOSEPAK) 4 MG TBPK tablet If significant rash, will start at 6 tabs on day 1, then 5 tabs on day 2, then 4 tabs on day 3, then 3 tabs on day 4, then 2 tabs on day 5 and then 1 tab on day 6. Take with food. 21 tablet 0   metoCLOPramide  (REGLAN ) 10 MG tablet Take 1 tablet (10 mg total) by mouth every 8 (eight) hours as needed for nausea. 45 tablet 1   Multiple Vitamin (MULTIVITAMIN WITH MINERALS) TABS tablet Take 1 tablet by mouth daily.     naloxone  (NARCAN ) nasal spray 4 mg/0.1 mL Place 1 spray into the nose once.     nitroGLYCERIN  (NITROSTAT ) 0.4 MG SL tablet Place 1 tablet (0.4 mg total) under the tongue every 5 (five) minutes as needed for chest pain. 25 tablet 6   ondansetron  (ZOFRAN ) 4 MG tablet Take 1 tablet (4 mg total) by mouth every 8 (eight) hours as needed for nausea or vomiting. 20 tablet 0   ondansetron  (ZOFRAN ) 8 MG tablet Take 1 tablet (8 mg total) by mouth every 8 (eight) hours as needed for nausea or vomiting. 30 tablet 1   prochlorperazine  (COMPAZINE ) 10 MG tablet TAKE 1 TABLET(10 MG) BY MOUTH EVERY 6 HOURS AS NEEDED FOR NAUSEA OR VOMITING 30 tablet 1   ranolazine  (RANEXA ) 500 MG 12 hr tablet TAKE 1 TABLET BY MOUTH TWICE DAILY 60 tablet 10   rosuvastatin  (CRESTOR ) 20 MG tablet TAKE 1 TABLET BY MOUTH ONCE DAILY 90 tablet 1   sertraline  (ZOLOFT ) 100 MG tablet TAKE 1/2 TABLET(50 MG) BY MOUTH DAILY (Patient taking differently: Take 50 mg by mouth daily.) 45 tablet 1   tiZANidine  (ZANAFLEX ) 4 MG tablet Take 1 tablet (4 mg total) by mouth at bedtime. 30 tablet 3   topiramate  (TOPAMAX ) 50 MG tablet TAKE TWO (2) TABLETS BY MOUTH EVERY DAY AT BEDTIME (Patient taking differently: Take 50 mg by mouth at bedtime. TAKE TWO (2) TABLETS BY MOUTH EVERY DAY AT BEDTIME) 60 tablet 10   Ubrogepant  (UBRELVY ) 100 MG TABS Take 1 tablet (100 mg total) by mouth as needed (May repeat after 2 hours.  Maximum 2 tablets in 24 hours). TAKE 1 TABLET BY MOUTH BY MOUTH AS NEEDED( MAY REPEAT 1 TABLET AFTER 2  HOURS IF NEEDED, MAXIMUM 2 TABLETS IN 24 HOURS) Strength: 100 mg 48 tablet 1   Vitamin D , Ergocalciferol , (DRISDOL ) 1.25 MG (50000 UNIT) CAPS capsule TAKE 1 CAPSULE BY MOUTH EVERY 7 DAYS FOR 12 DOSES (Patient taking differently: Take 50,000 Units by mouth every 7 (seven) days. Patient usually takes this medication on mondays) 12 capsule 0   No current facility-administered medications for  this visit.   Facility-Administered Medications Ordered in Other Visits  Medication Dose Route Frequency Provider Last Rate Last Admin   0.9 %  sodium chloride  infusion   Intravenous Continuous Lowanda Ruddy, MD 10 mL/hr at 01/29/24 1329 New Bag at 01/29/24 1329   heparin  lock flush 100 unit/mL  500 Units Intracatheter Once PRN Lowanda Ruddy, MD       pembrolizumab  (KEYTRUDA ) 200 mg in sodium chloride  0.9 % 50 mL chemo infusion  200 mg Intravenous Once Lowanda Ruddy, MD       prochlorperazine  (COMPAZINE ) tablet 10 mg  10 mg Oral Once Lowanda Ruddy, MD       sodium chloride  flush (NS) 0.9 % injection 10 mL  10 mL Intracatheter PRN Lowanda Ruddy, MD        VITAL SIGNS: There were no vitals taken for this visit. There were no vitals filed for this visit.  Estimated body mass index is 29.73 kg/m as calculated from the following:   Height as of 12/24/23: 4\' 10"  (1.473 m).   Weight as of 12/25/23: 142 lb 4 oz (64.5 kg).  Assessment NAD, resting in bed Normal breathing patter RRR AAO x3  PERFORMANCE STATUS (ECOG) : 3 - Symptomatic, >50% confined to bed  IMPRESSION: Discussed the use of AI scribe software for clinical note transcription with the patient, who gave verbal consent to proceed.  History of Present Illness [name] is a 69 year old female with chronic pain and recent diagnosis of metastatic bladder cancer with T5 bone involvement who presents for symptom management follow-up. She is accompanied by her daughter [name].  Denies concerns with uncontrolled nausea, vomiting, or  constipation.   She has been experiencing diarrhea and is taking Imodium , four to five pills a day, with two pills after the first stool and one pill thereafter. Feels when taking Imodium  it does decrease episodes.   She has difficulty sleeping, which she attributes to her current medication regimen. She is taking tizanidine , which helps her fall asleep but she wakes up two to three hours later and cannot return to sleep. Reports significant increase in foot pain (right foot more than left). Education provided on the use of muscle relaxer with understanding it most likely will not resolve feet discomfort in the setting of neuropathic etiology in addition to medication is not to be used for insomnia. Patient and daughter verbalized understanding. No adjustments to tizanidine  at this time. Education provided on use of melatonin for insomnia.   Per [name] patient's treatment regimen to be adjusted due to signs of worsening neuropathy. She has developed painful sores on her buttocks, which she believes are due to prolonged sitting.  We discussed Ms. Signer's pain at length. She is supposed to be on methadone  three times a day and oxycodone  as needed. Her daughter reports patient did not feel that her pain was any different with use of oxycodone  and methadone . Ms. Lapier chose to self-discontinue use.   She is experiencing increased stomach pain and has been taking oxycodone  to manage the pain. Due to her increase in abdominal pain this has led to an increase in her oxycodone  usage from four to five pills a day. She has approximately ten pills left and is concerned about running out before her next refill. Education provided about importance of not self managing or adjusting regimen due to safety and refill need. Patient and family aware medications cannot be refilled early or overriden by pharmacy if  no supervised changes have been made in addition to regimen not being followed. I discussed at length if  pain is persistent patient should be a long-acting medication in addition to she was sent to us  from a pain clinic where both extended release and short-acting was utilized. They are aware that making adjustments to only short-acting medications such as oxycodone  is not best actions when attempting to managing pain and identifies pain is not well controlled warranting further supportive need for extended release.   Ms. Captain and her daughter expressed awareness of plan. Patient will continue with oxycodone  20mg  every 4 hours as needed. Hold methadone  for now until patient has upcoming scans per oncology. She is aware if no new indications or changes we will need to discuss her pain management moving forward.   All questions answered and support provided.    I discussed the importance of continued conversation with family and their medical providers regarding overall plan of care and treatment options, ensuring decisions are within the context of the patients values and GOCs. Assessment & Plan Diarrhea Severe diarrhea persists despite Imodium , with dehydration risk due to limited intake. - Continue Imodium  as needed.       - Monitor for dehydration.  Nausea Persistent nausea limits intake; Reglan  provides partial relief. Compazine  unavailable, considering marijuana pills. - Administer Reglan  every 6 hours as needed. - Modify diet with broth and soup. BRAT diet.  Generalized weakness and fatigue Weakness and fatigue likely from poor nutrition and ongoing symptoms. - Encourage fluid intake.   Appetite  Marinol  for appetite. Medication has been approved and authorized. Patient to contact pharmacy.   Chronic pain management Chronic abdominal pain worsened after methadone  discontinuation, increasing reliance on oxycodone . Discussed with patient about concerns for regulatory issues, self adjusting medications, and pain regimen in the setting of reports of uncontrolled pain. Scan scheduled for  June 19 to evaluate abdominal pain causes. Current oxycodone  regimen unsustainable without long-term strategy. - Increase oxycodone  to 20 mg every 4 hours as needed. - Ensure pharmacy processes new prescription for increased oxycodone  dose. - Schedule follow-up discussion post-scan to reassess pain management strategy. -Hold methadone  until next visit.   Insomnia Insomnia possibly related to medication regimen. Tizanidine  ineffective. Melatonin suggested as alternative. High-dose melatonin risks prolonged sedation. - Start melatonin at 5 mg, increase to 10 mg if needed after a few days.  Follow-up Scheduled to monitor progress and adjust treatment. - Review medication list for accuracy at next visit.  Patient expressed understanding and was in agreement with this plan. She also understands that She can call the clinic at any time with any questions, concerns, or complaints.   Any controlled substances utilized were prescribed in the context of palliative care. PDMP has been reviewed.   Visit consisted of counseling and education dealing with the complex and emotionally intense issues of symptom management and palliative care in the setting of serious and potentially life-threatening illness.  Dellia Ferguson, AGPCNP-BC  Palliative Medicine Team/[facility]

## 2024-01-29 NOTE — Assessment & Plan Note (Addendum)
 AKI on CKD Increase fluid Repeat next week.

## 2024-01-29 NOTE — Assessment & Plan Note (Addendum)
 Worsening this week even with imodium  and lomotil  Will give one L of NS today.

## 2024-01-29 NOTE — Progress Notes (Signed)
 Nutrition follow-up completed in the infusion area with patient's daughter.  Patient is sleeping and has not felt well today.  Receiving Keytruda  for metastatic bladder cancer.  She is followed by [name].  Patient declined being weighed today.  She is reporting increased abdominal pain, nausea and diarrhea.  Per her daughter, diarrhea and nausea have worsened.  They cannot identify a pattern for the nausea.  States there does not seem to be a reason when it worsens.  Sometimes she eats well and sometimes she does not.  Patient enjoys drinking Ensure.  Nutrition diagnosis: Unintended weight loss cannot be evaluated.  Intervention: Reviewed strategies for eating with nausea and diarrhea. If tolerated patient can drink Ensure Plus or equivalent slowly throughout the day. Will investigate to see if patient can receive Ensure Plus or equivalent through Medicaid.  Monitoring, evaluation, goals: Patient will tolerate adequate calories and protein to minimize weight loss and improve quality of life.  Next visit: To be scheduled with upcoming appointments as needed.  **Disclaimer: This note was dictated with voice recognition software. Similar sounding words can inadvertently be transcribed and this note may contain transcription errors which may not have been corrected upon publication of note.**

## 2024-01-29 NOTE — Assessment & Plan Note (Addendum)
Folic acid 1mg daily

## 2024-01-29 NOTE — Assessment & Plan Note (Addendum)
 Clinically stable. Last EF 55 to 60%  Continue adequate fluid as able.  Hold diuretics and use as needed if increase edema

## 2024-01-29 NOTE — Assessment & Plan Note (Addendum)
 continue Pembro today and hold EV for neuropathy Seeing palliative care as well for chronic pain management Follow up next week PET scan on 6/19 scheduled.

## 2024-01-29 NOTE — Assessment & Plan Note (Addendum)
 Hold EV this week.

## 2024-01-29 NOTE — Progress Notes (Signed)
magne

## 2024-01-29 NOTE — Assessment & Plan Note (Addendum)
 Improved today Monitor with each treatment

## 2024-02-01 DIAGNOSIS — S31000A Unspecified open wound of lower back and pelvis without penetration into retroperitoneum, initial encounter: Secondary | ICD-10-CM | POA: Diagnosis not present

## 2024-02-01 DIAGNOSIS — L89312 Pressure ulcer of right buttock, stage 2: Secondary | ICD-10-CM | POA: Diagnosis not present

## 2024-02-01 DIAGNOSIS — T8131XA Disruption of external operation (surgical) wound, not elsewhere classified, initial encounter: Secondary | ICD-10-CM | POA: Diagnosis not present

## 2024-02-04 ENCOUNTER — Encounter: Payer: Self-pay | Admitting: Nutrition

## 2024-02-04 ENCOUNTER — Telehealth: Payer: Self-pay | Admitting: Family Medicine

## 2024-02-04 DIAGNOSIS — M4726 Other spondylosis with radiculopathy, lumbar region: Secondary | ICD-10-CM | POA: Diagnosis not present

## 2024-02-04 DIAGNOSIS — M47814 Spondylosis without myelopathy or radiculopathy, thoracic region: Secondary | ICD-10-CM | POA: Diagnosis not present

## 2024-02-04 DIAGNOSIS — D631 Anemia in chronic kidney disease: Secondary | ICD-10-CM | POA: Diagnosis not present

## 2024-02-04 DIAGNOSIS — I5042 Chronic combined systolic (congestive) and diastolic (congestive) heart failure: Secondary | ICD-10-CM | POA: Diagnosis not present

## 2024-02-04 DIAGNOSIS — D630 Anemia in neoplastic disease: Secondary | ICD-10-CM | POA: Diagnosis not present

## 2024-02-04 DIAGNOSIS — G8220 Paraplegia, unspecified: Secondary | ICD-10-CM | POA: Diagnosis not present

## 2024-02-04 DIAGNOSIS — E785 Hyperlipidemia, unspecified: Secondary | ICD-10-CM | POA: Diagnosis not present

## 2024-02-04 DIAGNOSIS — C679 Malignant neoplasm of bladder, unspecified: Secondary | ICD-10-CM | POA: Diagnosis not present

## 2024-02-04 DIAGNOSIS — D520 Dietary folate deficiency anemia: Secondary | ICD-10-CM | POA: Diagnosis not present

## 2024-02-04 DIAGNOSIS — M899 Disorder of bone, unspecified: Secondary | ICD-10-CM | POA: Diagnosis not present

## 2024-02-04 DIAGNOSIS — E440 Moderate protein-calorie malnutrition: Secondary | ICD-10-CM | POA: Diagnosis not present

## 2024-02-04 DIAGNOSIS — F331 Major depressive disorder, recurrent, moderate: Secondary | ICD-10-CM

## 2024-02-04 DIAGNOSIS — I2510 Atherosclerotic heart disease of native coronary artery without angina pectoris: Secondary | ICD-10-CM | POA: Diagnosis not present

## 2024-02-04 DIAGNOSIS — F5101 Primary insomnia: Secondary | ICD-10-CM | POA: Diagnosis not present

## 2024-02-04 DIAGNOSIS — J9611 Chronic respiratory failure with hypoxia: Secondary | ICD-10-CM | POA: Diagnosis not present

## 2024-02-04 DIAGNOSIS — I420 Dilated cardiomyopathy: Secondary | ICD-10-CM | POA: Diagnosis not present

## 2024-02-04 DIAGNOSIS — M069 Rheumatoid arthritis, unspecified: Secondary | ICD-10-CM | POA: Diagnosis not present

## 2024-02-04 DIAGNOSIS — Z936 Other artificial openings of urinary tract status: Secondary | ICD-10-CM | POA: Diagnosis not present

## 2024-02-04 DIAGNOSIS — M8008XD Age-related osteoporosis with current pathological fracture, vertebra(e), subsequent encounter for fracture with routine healing: Secondary | ICD-10-CM | POA: Diagnosis not present

## 2024-02-04 DIAGNOSIS — J4489 Other specified chronic obstructive pulmonary disease: Secondary | ICD-10-CM | POA: Diagnosis not present

## 2024-02-04 DIAGNOSIS — N1330 Unspecified hydronephrosis: Secondary | ICD-10-CM | POA: Diagnosis not present

## 2024-02-04 DIAGNOSIS — N1832 Chronic kidney disease, stage 3b: Secondary | ICD-10-CM | POA: Diagnosis not present

## 2024-02-04 DIAGNOSIS — L89312 Pressure ulcer of right buttock, stage 2: Secondary | ICD-10-CM | POA: Diagnosis not present

## 2024-02-04 NOTE — Telephone Encounter (Signed)
 [facility] [phone]

## 2024-02-04 NOTE — Progress Notes (Signed)
 Patient does not qualify for Oral Nutrition Supplements via insurance as her primary insurance is Medicare not Medicaid.

## 2024-02-05 ENCOUNTER — Ambulatory Visit

## 2024-02-05 ENCOUNTER — Other Ambulatory Visit

## 2024-02-05 ENCOUNTER — Inpatient Hospital Stay

## 2024-02-05 ENCOUNTER — Other Ambulatory Visit: Payer: Self-pay

## 2024-02-05 ENCOUNTER — Other Ambulatory Visit: Payer: Self-pay | Admitting: Nurse Practitioner

## 2024-02-05 VITALS — BP 107/67 | HR 91 | Temp 97.4°F | Resp 18 | Wt 155.0 lb

## 2024-02-05 DIAGNOSIS — Z87891 Personal history of nicotine dependence: Secondary | ICD-10-CM | POA: Diagnosis not present

## 2024-02-05 DIAGNOSIS — M79671 Pain in right foot: Secondary | ICD-10-CM | POA: Diagnosis not present

## 2024-02-05 DIAGNOSIS — N3000 Acute cystitis without hematuria: Secondary | ICD-10-CM

## 2024-02-05 DIAGNOSIS — I5022 Chronic systolic (congestive) heart failure: Secondary | ICD-10-CM

## 2024-02-05 DIAGNOSIS — R5383 Other fatigue: Secondary | ICD-10-CM | POA: Diagnosis not present

## 2024-02-05 DIAGNOSIS — M8458XA Pathological fracture in neoplastic disease, other specified site, initial encounter for fracture: Secondary | ICD-10-CM

## 2024-02-05 DIAGNOSIS — Z5112 Encounter for antineoplastic immunotherapy: Secondary | ICD-10-CM | POA: Diagnosis not present

## 2024-02-05 DIAGNOSIS — C679 Malignant neoplasm of bladder, unspecified: Secondary | ICD-10-CM

## 2024-02-05 DIAGNOSIS — M79672 Pain in left foot: Secondary | ICD-10-CM | POA: Diagnosis not present

## 2024-02-05 DIAGNOSIS — D638 Anemia in other chronic diseases classified elsewhere: Secondary | ICD-10-CM

## 2024-02-05 DIAGNOSIS — J9611 Chronic respiratory failure with hypoxia: Secondary | ICD-10-CM | POA: Diagnosis not present

## 2024-02-05 DIAGNOSIS — G893 Neoplasm related pain (acute) (chronic): Secondary | ICD-10-CM | POA: Diagnosis not present

## 2024-02-05 DIAGNOSIS — Z79891 Long term (current) use of opiate analgesic: Secondary | ICD-10-CM | POA: Diagnosis not present

## 2024-02-05 DIAGNOSIS — Z79620 Long term (current) use of immunosuppressive biologic: Secondary | ICD-10-CM | POA: Diagnosis not present

## 2024-02-05 DIAGNOSIS — N179 Acute kidney failure, unspecified: Secondary | ICD-10-CM

## 2024-02-05 DIAGNOSIS — G629 Polyneuropathy, unspecified: Secondary | ICD-10-CM | POA: Diagnosis not present

## 2024-02-05 DIAGNOSIS — G4700 Insomnia, unspecified: Secondary | ICD-10-CM | POA: Diagnosis not present

## 2024-02-05 DIAGNOSIS — R531 Weakness: Secondary | ICD-10-CM | POA: Diagnosis not present

## 2024-02-05 DIAGNOSIS — C7951 Secondary malignant neoplasm of bone: Secondary | ICD-10-CM | POA: Diagnosis not present

## 2024-02-05 DIAGNOSIS — R110 Nausea: Secondary | ICD-10-CM | POA: Diagnosis not present

## 2024-02-05 DIAGNOSIS — Z515 Encounter for palliative care: Secondary | ICD-10-CM | POA: Diagnosis not present

## 2024-02-05 DIAGNOSIS — R197 Diarrhea, unspecified: Secondary | ICD-10-CM | POA: Diagnosis not present

## 2024-02-05 DIAGNOSIS — N1831 Chronic kidney disease, stage 3a: Secondary | ICD-10-CM | POA: Diagnosis not present

## 2024-02-05 LAB — CBC WITH DIFFERENTIAL (CANCER CENTER ONLY)
Abs Immature Granulocytes: 0.09 10*3/uL — ABNORMAL HIGH (ref 0.00–0.07)
Basophils Absolute: 0.1 10*3/uL (ref 0.0–0.1)
Basophils Relative: 1 %
Eosinophils Absolute: 0.3 10*3/uL (ref 0.0–0.5)
Eosinophils Relative: 3 %
HCT: 27.5 % — ABNORMAL LOW (ref 36.0–46.0)
Hemoglobin: 9 g/dL — ABNORMAL LOW (ref 12.0–15.0)
Immature Granulocytes: 1 %
Lymphocytes Relative: 11 %
Lymphs Abs: 1.4 10*3/uL (ref 0.7–4.0)
MCH: 30.3 pg (ref 26.0–34.0)
MCHC: 32.7 g/dL (ref 30.0–36.0)
MCV: 92.6 fL (ref 80.0–100.0)
Monocytes Absolute: 1.2 10*3/uL — ABNORMAL HIGH (ref 0.1–1.0)
Monocytes Relative: 10 %
Neutro Abs: 9.2 10*3/uL — ABNORMAL HIGH (ref 1.7–7.7)
Neutrophils Relative %: 74 %
Platelet Count: 242 10*3/uL (ref 150–400)
RBC: 2.97 MIL/uL — ABNORMAL LOW (ref 3.87–5.11)
RDW: 17 % — ABNORMAL HIGH (ref 11.5–15.5)
WBC Count: 12.4 10*3/uL — ABNORMAL HIGH (ref 4.0–10.5)
nRBC: 0 % (ref 0.0–0.2)

## 2024-02-05 LAB — VITAMIN B12: Vitamin B-12: 3319 pg/mL — ABNORMAL HIGH (ref 180–914)

## 2024-02-05 LAB — CMP (CANCER CENTER ONLY)
ALT: 8 U/L (ref 0–44)
AST: 19 U/L (ref 15–41)
Albumin: 2.6 g/dL — ABNORMAL LOW (ref 3.5–5.0)
Alkaline Phosphatase: 92 U/L (ref 38–126)
Anion gap: 10 (ref 5–15)
BUN: 22 mg/dL (ref 8–23)
CO2: 20 mmol/L — ABNORMAL LOW (ref 22–32)
Calcium: 9.1 mg/dL (ref 8.9–10.3)
Chloride: 109 mmol/L (ref 98–111)
Creatinine: 1.19 mg/dL — ABNORMAL HIGH (ref 0.44–1.00)
GFR, Estimated: 49 mL/min — ABNORMAL LOW (ref >60)
Glucose, Bld: 107 mg/dL — ABNORMAL HIGH (ref 70–99)
Potassium: 4.3 mmol/L (ref 3.5–5.1)
Sodium: 139 mmol/L (ref 135–145)
Total Bilirubin: 0.6 mg/dL (ref 0.0–1.2)
Total Protein: 6.2 g/dL — ABNORMAL LOW (ref 6.5–8.1)

## 2024-02-05 LAB — MAGNESIUM: Magnesium: 2 mg/dL (ref 1.7–2.4)

## 2024-02-05 LAB — FOLATE: Folate: 13.1 ng/mL (ref >5.9)

## 2024-02-05 MED ORDER — SODIUM CHLORIDE 0.9% FLUSH
10.0000 mL | INTRAVENOUS | Status: DC | PRN
  Administered 2024-02-05: 10 mL

## 2024-02-05 MED ORDER — SODIUM CHLORIDE 0.9% FLUSH
10.0000 mL | Freq: Once | INTRAVENOUS | Status: AC
  Administered 2024-02-05: 10 mL

## 2024-02-05 MED ORDER — HEPARIN SOD (PORK) LOCK FLUSH 100 UNIT/ML IV SOLN
500.0000 [IU] | Freq: Once | INTRAVENOUS | Status: AC | PRN
  Administered 2024-02-05: 500 [IU]

## 2024-02-05 MED ORDER — SODIUM CHLORIDE 0.9 % IV SOLN
1.2500 mg/kg | Freq: Once | INTRAVENOUS | Status: DC
  Filled 2024-02-05: qty 8

## 2024-02-05 MED ORDER — SODIUM CHLORIDE 0.9 % IV SOLN: INTRAVENOUS | Status: DC

## 2024-02-05 MED ORDER — SULFAMETHOXAZOLE-TRIMETHOPRIM 800-160 MG PO TABS: 1.0000 | ORAL_TABLET | Freq: Two times a day (BID) | ORAL | 0 refills | Status: DC

## 2024-02-05 MED ORDER — PROCHLORPERAZINE MALEATE 10 MG PO TABS
10.0000 mg | ORAL_TABLET | Freq: Once | ORAL | Status: AC
  Administered 2024-02-05: 10 mg via ORAL
  Filled 2024-02-05: qty 1

## 2024-02-05 MED ORDER — SODIUM CHLORIDE 0.9 % IV SOLN
1.0000 mg/kg | Freq: Once | INTRAVENOUS | Status: AC
  Administered 2024-02-05: 70 mg via INTRAVENOUS
  Filled 2024-02-05: qty 7

## 2024-02-05 NOTE — Patient Instructions (Signed)
 [facility] - A DEPT OF [facility]  Discharge Instructions: Thank you for choosing [facility] to provide your oncology and hematology care.   If you have a lab appointment with the Cancer Center, please go directly to the Cancer Center and check in at the registration area.   Wear comfortable clothing and clothing appropriate for easy access to any Portacath or PICC line.   We strive to give you quality time with your provider. You may need to reschedule your appointment if you arrive late (15 or more minutes).  Arriving late affects you and other patients whose appointments are after yours.  Also, if you miss three or more appointments without notifying the office, you may be dismissed from the clinic at the provider's discretion.      For prescription refill requests, have your pharmacy contact our office and allow 72 hours for refills to be completed.    Today you received the following chemotherapy and/or immunotherapy agent: Enhertu      To help prevent nausea and vomiting after your treatment, we encourage you to take your nausea medication as directed.  BELOW ARE SYMPTOMS THAT SHOULD BE REPORTED IMMEDIATELY: *FEVER GREATER THAN 100.4 F (38 C) OR HIGHER *CHILLS OR SWEATING *NAUSEA AND VOMITING THAT IS NOT CONTROLLED WITH YOUR NAUSEA MEDICATION *UNUSUAL SHORTNESS OF BREATH *UNUSUAL BRUISING OR BLEEDING *URINARY PROBLEMS (pain or burning when urinating, or frequent urination) *BOWEL PROBLEMS (unusual diarrhea, constipation, pain near the anus) TENDERNESS IN MOUTH AND THROAT WITH OR WITHOUT PRESENCE OF ULCERS (sore throat, sores in mouth, or a toothache) UNUSUAL RASH, SWELLING OR PAIN  UNUSUAL VAGINAL DISCHARGE OR ITCHING   Items with * indicate a potential emergency and should be followed up as soon as possible or go to the Emergency Department if any problems should occur.  Please show the CHEMOTHERAPY ALERT CARD or IMMUNOTHERAPY  ALERT CARD at check-in to the Emergency Department and triage nurse.  Should you have questions after your visit or need to cancel or reschedule your appointment, please contact [facility] - A DEPT OF [facility]  Dept: [phone]  and follow the prompts.  Office hours are 8:00 a.m. to 4:30 p.m. Monday - Friday. Please note that voicemails left after 4:00 p.m. may not be returned until the following business day.  We are closed weekends and major holidays. You have access to a nurse at all times for urgent questions. Please call the main number to the clinic Dept: [phone] and follow the prompts.   For any non-urgent questions, you may also contact your provider using MyChart. We now offer e-Visits for anyone 75 and older to request care online for non-urgent symptoms. For details visit [email or web address].   Also download the MyChart app! Go to the app store, search "MyChart", open the app, select [place], and log in with your MyChart username and password.  Fam-Trastuzumab Deruxtecan Injection What is this medication? FAM-TRASTUZUMAB DERUXTECAN (fam-tras TOOZ eu mab DER ux TEE kan) treats some types of cancer. It works by blocking a protein that causes cancer cells to grow and multiply. This helps to slow or stop the spread of cancer cells. This medicine may be used for other purposes; ask your health care provider or pharmacist if you have questions. COMMON BRAND NAME(S): ENHERTU What should I tell my care team before I take this medication? They need to know if you have any of these  conditions: Heart disease Heart failure Infection, especially a viral infection, such as chickenpox, cold sores, or herpes Liver disease Lung or breathing disease, such as asthma or COPD An unusual or allergic reaction to fam-trastuzumab deruxtecan, other medications, foods, dyes, or preservatives Pregnant or trying to get pregnant Breast-feeding How should I use  this medication? This medication is injected into a vein. It is given by your care team in a hospital or clinic setting. A special MedGuide will be given to you before each treatment. Be sure to read this information carefully each time. Talk to your care team about the use of this medication in children. Special care may be needed. Overdosage: If you think you have taken too much of this medicine contact a poison control center or emergency room at once. NOTE: This medicine is only for you. Do not share this medicine with others. What if I miss a dose? It is important not to miss your dose. Call your care team if you are unable to keep an appointment. What may interact with this medication? Interactions are not expected. This list may not describe all possible interactions. Give your health care provider a list of all the medicines, herbs, non-prescription drugs, or dietary supplements you use. Also tell them if you smoke, drink alcohol, or use illegal drugs. Some items may interact with your medicine. What should I watch for while using this medication? Visit your care team for regular checks on your progress. Tell your care team if your symptoms do not start to get better or if they get worse. This medication may increase your risk of getting an infection. Call your care team for advice if you get a fever, chills, sore throat, or other symptoms of a cold or flu. Do not treat yourself. Try to avoid being around people who are sick. Avoid taking medications that contain aspirin, acetaminophen, ibuprofen, naproxen, or ketoprofen unless instructed by your care team. These medications may hide a fever. Be careful brushing or flossing your teeth or using a toothpick because you may get an infection or bleed more easily. If you have any dental work done, tell your dentist you are receiving this medication. This medication may cause dry eyes and blurred vision. If you wear contact lenses, you may feel  some discomfort. Lubricating eye drops may help. See your care team if the problem does not go away or is severe. Talk to your care team if you may be pregnant. Serious birth defects can occur if you take this medication during pregnancy and for 7 months after the last dose. If your partner can get pregnant, use a condom during sex while taking this medication and for 4 months after the last dose. Do not breastfeed while taking this medication and for 7 months after the last dose. This medication may cause infertility. Talk to your care team if you are concerned about your fertility. What side effects may I notice from receiving this medication? Side effects that you should report to your care team as soon as possible: Allergic reactions--skin rash, itching, hives, swelling of the face, lips, tongue, or throat Dry cough, shortness of breath or trouble breathing Infection--fever, chills, cough, sore throat, wounds that don't heal, pain or trouble when passing urine, general feeling of discomfort or being unwell Heart failure--shortness of breath, swelling of the ankles, feet, or hands, sudden weight gain, unusual weakness or fatigue Unusual bruising or bleeding Side effects that usually do not require medical attention (report these to your care  team if they continue or are bothersome): Constipation Diarrhea Hair loss Muscle pain Nausea Vomiting This list may not describe all possible side effects. Call your doctor for medical advice about side effects. You may report side effects to FDA at 1-800-FDA-1088. Where should I keep my medication? This medication is given in a hospital or clinic. It will not be stored at home. NOTE: This sheet is a summary. It may not cover all possible information. If you have questions about this medicine, talk to your doctor, pharmacist, or health care provider.  2024 Elsevier/Gold Standard (2023-04-11 00:00:00)

## 2024-02-05 NOTE — Progress Notes (Signed)
 Reduce padcev  to 1mg /kg per Dr Alita Irwin

## 2024-02-05 NOTE — Assessment & Plan Note (Deleted)
 Held EV C1D1

## 2024-02-05 NOTE — Progress Notes (Deleted)
 [facility] OFFICE PROGRESS NOTE  Patient Care Team: Janece Means, FNP as PCP - General (Family Medicine) Lei Pump, MD as PCP - Electrophysiology (Cardiology) Manfred Seed, MD as PCP - Cardiology (Cardiology) Garry Kansas, MD as Consulting Physician (Neurosurgery) Manfred Seed, MD as Consulting Physician (Cardiology) Candyce Champagne, MD as Consulting Physician (General Surgery) Merriam Abbey, DO as Consulting Physician (Neurology) Steven Elam, [facility] (Inactive) as Pharmacist (Pharmacist) Genell Ken, MD as Consulting Physician (Gastroenterology) Zehr, Martina Sledge, PA-C as Physician Assistant (Gastroenterology) Cristi Donalds, MD as Consulting Physician (Nephrology) Pickenpack-Cousar, Giles Labrum, NP as Nurse Practitioner (Hospice and Palliative Medicine)  68 y.o.female with past medical history of COPD, chronic back from from DDD, osteoporosis with fractures presenting with back pain radiating down to the hip in March of 2025 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  Cystoscopy with TURBT show right sided bladder tumor with palpable anterior vaginal mass.  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    Current diagnosis: Stage IVB UC with bone metastases Treatment:  12/18/23 C1D1 EV/P   Daughter observed functional improvement at home.   Baseline edema unchanged after holding diuretics. Lung sounds clear today.   Seeing PT about 1-2x/week and able to stand and walk at home.   Treatment of EV held last week for abdominal pain, diarrhea, nausea, neuropathy. Clinically  Will hold EV today.   Assessment & Plan Urothelial carcinoma of bladder (HCC)  EV for neuropathy Seeing palliative care as well for chronic pain management PET scan on 6/19 scheduled. Follow up on 6/26 as scheduled. Neuropathy Held EV C1D1 Folate deficiency Take folic acid  1 mg daily  No orders of the  defined types were placed in this encounter.    Crystal Ruddy, MD  INTERVAL HISTORY: Patient returns for follow-up.  Oncology History  Urothelial carcinoma of bladder (HCC)  11/18/2023 Initial Diagnosis   Urothelial carcinoma of bladder (HCC)   12/01/2023 Cancer Staging   Staging form: Urinary Bladder, AJCC 8th Edition - Clinical: Stage IVB (cTX, cNX, pM1b) - Signed by Crystal Ruddy, MD on 12/01/2023 WHO/ISUP grade (low/high): High Grade Histologic grading system: 2 grade system   12/18/2023 -  Chemotherapy   Patient is on Treatment Plan : UROTHELIAL ADVANCED, METASTATIC ENFORTUMAB D1, D8 + PEMBROLIZUMAB  (200) D1 Q21D        PHYSICAL EXAMINATION: ECOG PERFORMANCE STATUS: {CHL ONC ECOG PS:[phone]}  There were no vitals filed for this visit. There were no vitals filed for this visit.  Relevant data reviewed during this visit included ***

## 2024-02-05 NOTE — Progress Notes (Signed)
 [name] OFFICE PROGRESS NOTE  Patient Care Team: Janece Means, FNP as PCP - General (Family Medicine) Lei Pump, MD as PCP - Electrophysiology (Cardiology) Manfred Seed, MD as PCP - Cardiology (Cardiology) Garry Kansas, MD as Consulting Physician (Neurosurgery) Manfred Seed, MD as Consulting Physician (Cardiology) Candyce Champagne, MD as Consulting Physician (General Surgery) Merriam Abbey, DO as Consulting Physician (Neurology) Steven Elam, [facility] (Inactive) as Pharmacist (Pharmacist) Genell Ken, MD as Consulting Physician (Gastroenterology) Zehr, Martina Sledge, PA-C as Physician Assistant (Gastroenterology) Cristi Donalds, MD as Consulting Physician (Nephrology) Pickenpack-Cousar, Giles Labrum, NP as Nurse Practitioner (Hospice and Palliative Medicine)  68 y.o.female with past medical history of COPD, chronic back from from DDD, osteoporosis with fractures presenting with back pain radiating down to the hip in March of 2025 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  Cystoscopy with TURBT show right sided bladder tumor with palpable anterior vaginal mass.  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    Current diagnosis: Stage IVB UC with bone metastases Treatment:  12/18/23 C1D1 EV/P   Daughter observed functional improvement at home.   Baseline edema unchanged after holding diuretics. Lung sounds clear today.   Seeing PT about 1-2x/week and able to stand and walk at home. Clinically improving.   Treatment of EV held last week for abdominal pain, diarrhea, nausea, neuropathy. Clinically mostly resolved other than mild neuropathy at night. Will reduce dose of EV today.   Report has not change her Foley. Will reach out to Urology to get her in next week. Clinically stable without toxic appearance but report some burning sensation. Will start bactrim  until sees  Urology. Assessment & Plan Urothelial carcinoma of bladder (HCC) Reduced dose of EV to 1mg /kg for neuropathy Seeing palliative care as well for chronic pain management PET scan on 6/19 scheduled. Follow up on 6/26 as scheduled. Acute cystitis without hematuria Bactrim  twice daily for 7 days Follow up with Urology next week. AKI (acute kidney injury) (HCC) Improved Continue adequate fluid daily Chronic systolic congestive heart failure, NYHA class 2 (HCC) May continue diuretic as needed due to concerning for dehydration and decreased oral intake previously and also hypotension. One dose today or tomorrow. Continue coreg . Anemia of chronic disease Repeat b12 and folate today.  Return in 2 weeks as scheduled.   Crystal Ruddy, MD  INTERVAL HISTORY: Patient returns for follow-up. Report clinically feeling better. Less neuropathy given a week off.  Only a little bit over the right foot at night.  No fever or chills.  Reports she cannot get into urology for Foley exchange for 2 weeks.  Reports some burning sensation.  No lower back pain, abdominal pain.  Report of diarrhea controlled with Lomotil .  Oncology History  Urothelial carcinoma of bladder (HCC)  11/18/2023 Initial Diagnosis   Urothelial carcinoma of bladder (HCC)   12/01/2023 Cancer Staging   Staging form: Urinary Bladder, AJCC 8th Edition - Clinical: Stage IVB (cTX, cNX, pM1b) - Signed by Crystal Ruddy, MD on 12/01/2023 WHO/ISUP grade (low/high): High Grade Histologic grading system: 2 grade system   12/18/2023 -  Chemotherapy   Patient is on Treatment Plan : UROTHELIAL ADVANCED, METASTATIC ENFORTUMAB D1, D8 + PEMBROLIZUMAB  (200) D1 Q21D        PHYSICAL EXAMINATION: ECOG PERFORMANCE STATUS: 3 - Symptomatic, >50% confined to bed  Vital signs stable  02/05/2024  Oncology Vitals   Weight 70.3 kg   Weight (  lbs) 155 lbs   BMI 32.39 kg/m2   Temp 97.7 F (36.5 C)   Pulse Rate 90   BP 116/74   Resp 20   SpO2 99 %    BSA (m2) 1.7 m2      GENERAL: alert, no distress and comfortable SKIN: skin color pale EYES: sclera clear LUNGS: normal breathing effort ABDOMEN: abdomen soft, non-tender and nondistended. Foley in place Musculoskeletal: bilateral pitting edema   Relevant data reviewed during this visit included labs.

## 2024-02-05 NOTE — Assessment & Plan Note (Addendum)
 Reduced dose of EV to 1mg /kg for neuropathy Seeing palliative care as well for chronic pain management PET scan on 6/19 scheduled. Follow up on 6/26 as scheduled.

## 2024-02-05 NOTE — Assessment & Plan Note (Deleted)
 EV for neuropathy Seeing palliative care as well for chronic pain management PET scan on 6/19 scheduled. Follow up on 6/26 as scheduled.

## 2024-02-05 NOTE — Assessment & Plan Note (Deleted)
 Holding diuretic with dehydration and decreased oral intake Use as needed

## 2024-02-05 NOTE — Assessment & Plan Note (Addendum)
 Improved Continue adequate fluid daily

## 2024-02-05 NOTE — Assessment & Plan Note (Addendum)
 Repeat b12 and folate today.

## 2024-02-05 NOTE — Assessment & Plan Note (Addendum)
 May continue diuretic as needed due to concerning for dehydration and decreased oral intake previously and also hypotension. One dose today or tomorrow. Continue coreg .

## 2024-02-05 NOTE — Assessment & Plan Note (Deleted)
Take folic acid 1 mg daily

## 2024-02-06 DIAGNOSIS — M069 Rheumatoid arthritis, unspecified: Secondary | ICD-10-CM | POA: Diagnosis not present

## 2024-02-06 DIAGNOSIS — F5101 Primary insomnia: Secondary | ICD-10-CM | POA: Diagnosis not present

## 2024-02-06 DIAGNOSIS — M4726 Other spondylosis with radiculopathy, lumbar region: Secondary | ICD-10-CM | POA: Diagnosis not present

## 2024-02-06 DIAGNOSIS — C679 Malignant neoplasm of bladder, unspecified: Secondary | ICD-10-CM | POA: Diagnosis not present

## 2024-02-06 DIAGNOSIS — E785 Hyperlipidemia, unspecified: Secondary | ICD-10-CM | POA: Diagnosis not present

## 2024-02-06 DIAGNOSIS — J4489 Other specified chronic obstructive pulmonary disease: Secondary | ICD-10-CM | POA: Diagnosis not present

## 2024-02-06 DIAGNOSIS — N1832 Chronic kidney disease, stage 3b: Secondary | ICD-10-CM | POA: Diagnosis not present

## 2024-02-06 DIAGNOSIS — J9611 Chronic respiratory failure with hypoxia: Secondary | ICD-10-CM | POA: Diagnosis not present

## 2024-02-06 DIAGNOSIS — L89312 Pressure ulcer of right buttock, stage 2: Secondary | ICD-10-CM | POA: Diagnosis not present

## 2024-02-06 DIAGNOSIS — I5042 Chronic combined systolic (congestive) and diastolic (congestive) heart failure: Secondary | ICD-10-CM | POA: Diagnosis not present

## 2024-02-06 DIAGNOSIS — D631 Anemia in chronic kidney disease: Secondary | ICD-10-CM | POA: Diagnosis not present

## 2024-02-06 DIAGNOSIS — Z936 Other artificial openings of urinary tract status: Secondary | ICD-10-CM | POA: Diagnosis not present

## 2024-02-06 DIAGNOSIS — M47814 Spondylosis without myelopathy or radiculopathy, thoracic region: Secondary | ICD-10-CM | POA: Diagnosis not present

## 2024-02-06 DIAGNOSIS — D520 Dietary folate deficiency anemia: Secondary | ICD-10-CM | POA: Diagnosis not present

## 2024-02-06 DIAGNOSIS — N1330 Unspecified hydronephrosis: Secondary | ICD-10-CM | POA: Diagnosis not present

## 2024-02-06 DIAGNOSIS — M8008XD Age-related osteoporosis with current pathological fracture, vertebra(e), subsequent encounter for fracture with routine healing: Secondary | ICD-10-CM | POA: Diagnosis not present

## 2024-02-06 DIAGNOSIS — I2510 Atherosclerotic heart disease of native coronary artery without angina pectoris: Secondary | ICD-10-CM | POA: Diagnosis not present

## 2024-02-06 DIAGNOSIS — I420 Dilated cardiomyopathy: Secondary | ICD-10-CM | POA: Diagnosis not present

## 2024-02-06 DIAGNOSIS — G8220 Paraplegia, unspecified: Secondary | ICD-10-CM | POA: Diagnosis not present

## 2024-02-06 DIAGNOSIS — M899 Disorder of bone, unspecified: Secondary | ICD-10-CM | POA: Diagnosis not present

## 2024-02-06 DIAGNOSIS — E440 Moderate protein-calorie malnutrition: Secondary | ICD-10-CM | POA: Diagnosis not present

## 2024-02-06 DIAGNOSIS — D630 Anemia in neoplastic disease: Secondary | ICD-10-CM | POA: Diagnosis not present

## 2024-02-09 ENCOUNTER — Other Ambulatory Visit: Payer: Self-pay

## 2024-02-09 DIAGNOSIS — Z515 Encounter for palliative care: Secondary | ICD-10-CM

## 2024-02-09 DIAGNOSIS — G893 Neoplasm related pain (acute) (chronic): Secondary | ICD-10-CM

## 2024-02-09 DIAGNOSIS — M5489 Other dorsalgia: Secondary | ICD-10-CM

## 2024-02-09 DIAGNOSIS — C679 Malignant neoplasm of bladder, unspecified: Secondary | ICD-10-CM

## 2024-02-10 ENCOUNTER — Other Ambulatory Visit: Payer: Self-pay

## 2024-02-10 DIAGNOSIS — N1832 Chronic kidney disease, stage 3b: Secondary | ICD-10-CM | POA: Diagnosis not present

## 2024-02-10 DIAGNOSIS — D631 Anemia in chronic kidney disease: Secondary | ICD-10-CM | POA: Diagnosis not present

## 2024-02-10 DIAGNOSIS — M899 Disorder of bone, unspecified: Secondary | ICD-10-CM | POA: Diagnosis not present

## 2024-02-10 DIAGNOSIS — E785 Hyperlipidemia, unspecified: Secondary | ICD-10-CM | POA: Diagnosis not present

## 2024-02-10 DIAGNOSIS — I5042 Chronic combined systolic (congestive) and diastolic (congestive) heart failure: Secondary | ICD-10-CM | POA: Diagnosis not present

## 2024-02-10 DIAGNOSIS — J449 Chronic obstructive pulmonary disease, unspecified: Secondary | ICD-10-CM | POA: Diagnosis not present

## 2024-02-10 DIAGNOSIS — J9611 Chronic respiratory failure with hypoxia: Secondary | ICD-10-CM | POA: Diagnosis not present

## 2024-02-10 DIAGNOSIS — M4726 Other spondylosis with radiculopathy, lumbar region: Secondary | ICD-10-CM | POA: Diagnosis not present

## 2024-02-10 DIAGNOSIS — L89312 Pressure ulcer of right buttock, stage 2: Secondary | ICD-10-CM | POA: Diagnosis not present

## 2024-02-10 DIAGNOSIS — E440 Moderate protein-calorie malnutrition: Secondary | ICD-10-CM | POA: Diagnosis not present

## 2024-02-10 DIAGNOSIS — I420 Dilated cardiomyopathy: Secondary | ICD-10-CM | POA: Diagnosis not present

## 2024-02-10 DIAGNOSIS — D630 Anemia in neoplastic disease: Secondary | ICD-10-CM | POA: Diagnosis not present

## 2024-02-10 DIAGNOSIS — I2510 Atherosclerotic heart disease of native coronary artery without angina pectoris: Secondary | ICD-10-CM | POA: Diagnosis not present

## 2024-02-10 DIAGNOSIS — M8008XD Age-related osteoporosis with current pathological fracture, vertebra(e), subsequent encounter for fracture with routine healing: Secondary | ICD-10-CM | POA: Diagnosis not present

## 2024-02-10 DIAGNOSIS — M47814 Spondylosis without myelopathy or radiculopathy, thoracic region: Secondary | ICD-10-CM | POA: Diagnosis not present

## 2024-02-10 DIAGNOSIS — J4489 Other specified chronic obstructive pulmonary disease: Secondary | ICD-10-CM | POA: Diagnosis not present

## 2024-02-10 DIAGNOSIS — N1330 Unspecified hydronephrosis: Secondary | ICD-10-CM | POA: Diagnosis not present

## 2024-02-10 DIAGNOSIS — D520 Dietary folate deficiency anemia: Secondary | ICD-10-CM | POA: Diagnosis not present

## 2024-02-10 DIAGNOSIS — Z936 Other artificial openings of urinary tract status: Secondary | ICD-10-CM | POA: Diagnosis not present

## 2024-02-10 DIAGNOSIS — G8220 Paraplegia, unspecified: Secondary | ICD-10-CM | POA: Diagnosis not present

## 2024-02-10 DIAGNOSIS — C679 Malignant neoplasm of bladder, unspecified: Secondary | ICD-10-CM | POA: Diagnosis not present

## 2024-02-10 DIAGNOSIS — M069 Rheumatoid arthritis, unspecified: Secondary | ICD-10-CM | POA: Diagnosis not present

## 2024-02-10 DIAGNOSIS — F5101 Primary insomnia: Secondary | ICD-10-CM | POA: Diagnosis not present

## 2024-02-11 DIAGNOSIS — M8008XD Age-related osteoporosis with current pathological fracture, vertebra(e), subsequent encounter for fracture with routine healing: Secondary | ICD-10-CM | POA: Diagnosis not present

## 2024-02-11 DIAGNOSIS — I5022 Chronic systolic (congestive) heart failure: Secondary | ICD-10-CM | POA: Diagnosis not present

## 2024-02-11 DIAGNOSIS — I2510 Atherosclerotic heart disease of native coronary artery without angina pectoris: Secondary | ICD-10-CM | POA: Diagnosis not present

## 2024-02-11 DIAGNOSIS — D631 Anemia in chronic kidney disease: Secondary | ICD-10-CM | POA: Diagnosis not present

## 2024-02-11 DIAGNOSIS — J4489 Other specified chronic obstructive pulmonary disease: Secondary | ICD-10-CM | POA: Diagnosis not present

## 2024-02-11 DIAGNOSIS — L89312 Pressure ulcer of right buttock, stage 2: Secondary | ICD-10-CM | POA: Diagnosis not present

## 2024-02-11 DIAGNOSIS — N1832 Chronic kidney disease, stage 3b: Secondary | ICD-10-CM | POA: Diagnosis not present

## 2024-02-11 DIAGNOSIS — M4726 Other spondylosis with radiculopathy, lumbar region: Secondary | ICD-10-CM | POA: Diagnosis not present

## 2024-02-11 DIAGNOSIS — M899 Disorder of bone, unspecified: Secondary | ICD-10-CM | POA: Diagnosis not present

## 2024-02-11 DIAGNOSIS — M47814 Spondylosis without myelopathy or radiculopathy, thoracic region: Secondary | ICD-10-CM | POA: Diagnosis not present

## 2024-02-11 DIAGNOSIS — Z936 Other artificial openings of urinary tract status: Secondary | ICD-10-CM | POA: Diagnosis not present

## 2024-02-11 DIAGNOSIS — G8220 Paraplegia, unspecified: Secondary | ICD-10-CM | POA: Diagnosis not present

## 2024-02-11 DIAGNOSIS — C679 Malignant neoplasm of bladder, unspecified: Secondary | ICD-10-CM | POA: Diagnosis not present

## 2024-02-11 DIAGNOSIS — J9611 Chronic respiratory failure with hypoxia: Secondary | ICD-10-CM | POA: Diagnosis not present

## 2024-02-11 DIAGNOSIS — I420 Dilated cardiomyopathy: Secondary | ICD-10-CM | POA: Diagnosis not present

## 2024-02-11 DIAGNOSIS — D630 Anemia in neoplastic disease: Secondary | ICD-10-CM | POA: Diagnosis not present

## 2024-02-11 DIAGNOSIS — F5101 Primary insomnia: Secondary | ICD-10-CM | POA: Diagnosis not present

## 2024-02-11 DIAGNOSIS — E440 Moderate protein-calorie malnutrition: Secondary | ICD-10-CM | POA: Diagnosis not present

## 2024-02-11 DIAGNOSIS — I5042 Chronic combined systolic (congestive) and diastolic (congestive) heart failure: Secondary | ICD-10-CM | POA: Diagnosis not present

## 2024-02-11 DIAGNOSIS — D520 Dietary folate deficiency anemia: Secondary | ICD-10-CM | POA: Diagnosis not present

## 2024-02-11 DIAGNOSIS — E785 Hyperlipidemia, unspecified: Secondary | ICD-10-CM | POA: Diagnosis not present

## 2024-02-11 DIAGNOSIS — N1330 Unspecified hydronephrosis: Secondary | ICD-10-CM | POA: Diagnosis not present

## 2024-02-11 DIAGNOSIS — M069 Rheumatoid arthritis, unspecified: Secondary | ICD-10-CM | POA: Diagnosis not present

## 2024-02-12 ENCOUNTER — Other Ambulatory Visit: Payer: Self-pay

## 2024-02-12 ENCOUNTER — Encounter (HOSPITAL_COMMUNITY)
Admission: RE | Admit: 2024-02-12 | Discharge: 2024-02-12 | Disposition: A | Discharge status: Home / Self Care | Source: Ambulatory Visit

## 2024-02-12 ENCOUNTER — Other Ambulatory Visit: Payer: Self-pay | Admitting: Nurse Practitioner

## 2024-02-12 DIAGNOSIS — C679 Malignant neoplasm of bladder, unspecified: Secondary | ICD-10-CM | POA: Diagnosis not present

## 2024-02-12 DIAGNOSIS — R197 Diarrhea, unspecified: Secondary | ICD-10-CM

## 2024-02-12 DIAGNOSIS — G893 Neoplasm related pain (acute) (chronic): Secondary | ICD-10-CM

## 2024-02-12 DIAGNOSIS — Z515 Encounter for palliative care: Secondary | ICD-10-CM

## 2024-02-12 DIAGNOSIS — C7951 Secondary malignant neoplasm of bone: Secondary | ICD-10-CM | POA: Diagnosis not present

## 2024-02-12 LAB — GLUCOSE, CAPILLARY: Glucose-Capillary: 89 mg/dL (ref 70–99)

## 2024-02-12 MED ORDER — FLUDEOXYGLUCOSE F - 18 (FDG) INJECTION
7.6200 | Freq: Once | INTRAVENOUS | Status: AC | PRN
  Administered 2024-02-12: 7.62 via INTRAVENOUS

## 2024-02-12 MED ORDER — DIPHENOXYLATE-ATROPINE 2.5-0.025 MG PO TABS: 1.0000 | ORAL_TABLET | Freq: Four times a day (QID) | ORAL | 0 refills | Status: DC | PRN

## 2024-02-12 MED ORDER — PROCHLORPERAZINE MALEATE 10 MG PO TABS
10.0000 mg | ORAL_TABLET | Freq: Four times a day (QID) | ORAL | 1 refills | Status: DC | PRN
Start: 2024-02-12 — End: 2024-03-10

## 2024-02-12 MED ORDER — OXYCODONE HCL 20 MG PO TABS: 20.0000 mg | ORAL_TABLET | ORAL | 0 refills | Status: DC | PRN

## 2024-02-13 ENCOUNTER — Telehealth: Payer: Self-pay | Admitting: Family Medicine

## 2024-02-13 DIAGNOSIS — L89312 Pressure ulcer of right buttock, stage 2: Secondary | ICD-10-CM | POA: Diagnosis not present

## 2024-02-13 DIAGNOSIS — D520 Dietary folate deficiency anemia: Secondary | ICD-10-CM | POA: Diagnosis not present

## 2024-02-13 DIAGNOSIS — J9611 Chronic respiratory failure with hypoxia: Secondary | ICD-10-CM | POA: Diagnosis not present

## 2024-02-13 DIAGNOSIS — N1330 Unspecified hydronephrosis: Secondary | ICD-10-CM | POA: Diagnosis not present

## 2024-02-13 DIAGNOSIS — Z936 Other artificial openings of urinary tract status: Secondary | ICD-10-CM | POA: Diagnosis not present

## 2024-02-13 DIAGNOSIS — M47814 Spondylosis without myelopathy or radiculopathy, thoracic region: Secondary | ICD-10-CM | POA: Diagnosis not present

## 2024-02-13 DIAGNOSIS — N1832 Chronic kidney disease, stage 3b: Secondary | ICD-10-CM | POA: Diagnosis not present

## 2024-02-13 DIAGNOSIS — M069 Rheumatoid arthritis, unspecified: Secondary | ICD-10-CM | POA: Diagnosis not present

## 2024-02-13 DIAGNOSIS — I2510 Atherosclerotic heart disease of native coronary artery without angina pectoris: Secondary | ICD-10-CM | POA: Diagnosis not present

## 2024-02-13 DIAGNOSIS — E785 Hyperlipidemia, unspecified: Secondary | ICD-10-CM | POA: Diagnosis not present

## 2024-02-13 DIAGNOSIS — I5042 Chronic combined systolic (congestive) and diastolic (congestive) heart failure: Secondary | ICD-10-CM | POA: Diagnosis not present

## 2024-02-13 DIAGNOSIS — D630 Anemia in neoplastic disease: Secondary | ICD-10-CM | POA: Diagnosis not present

## 2024-02-13 DIAGNOSIS — M8008XD Age-related osteoporosis with current pathological fracture, vertebra(e), subsequent encounter for fracture with routine healing: Secondary | ICD-10-CM | POA: Diagnosis not present

## 2024-02-13 DIAGNOSIS — M4726 Other spondylosis with radiculopathy, lumbar region: Secondary | ICD-10-CM | POA: Diagnosis not present

## 2024-02-13 DIAGNOSIS — E440 Moderate protein-calorie malnutrition: Secondary | ICD-10-CM | POA: Diagnosis not present

## 2024-02-13 DIAGNOSIS — F5101 Primary insomnia: Secondary | ICD-10-CM | POA: Diagnosis not present

## 2024-02-13 DIAGNOSIS — G8220 Paraplegia, unspecified: Secondary | ICD-10-CM | POA: Diagnosis not present

## 2024-02-13 DIAGNOSIS — M899 Disorder of bone, unspecified: Secondary | ICD-10-CM | POA: Diagnosis not present

## 2024-02-13 DIAGNOSIS — C679 Malignant neoplasm of bladder, unspecified: Secondary | ICD-10-CM | POA: Diagnosis not present

## 2024-02-13 DIAGNOSIS — D631 Anemia in chronic kidney disease: Secondary | ICD-10-CM | POA: Diagnosis not present

## 2024-02-13 DIAGNOSIS — R339 Retention of urine, unspecified: Secondary | ICD-10-CM | POA: Diagnosis not present

## 2024-02-13 DIAGNOSIS — J4489 Other specified chronic obstructive pulmonary disease: Secondary | ICD-10-CM | POA: Diagnosis not present

## 2024-02-13 DIAGNOSIS — I420 Dilated cardiomyopathy: Secondary | ICD-10-CM | POA: Diagnosis not present

## 2024-02-13 NOTE — Telephone Encounter (Signed)
 [facility] Standard Written Order

## 2024-02-16 ENCOUNTER — Ambulatory Visit: Payer: Self-pay

## 2024-02-16 ENCOUNTER — Telehealth: Payer: Self-pay | Admitting: Family Medicine

## 2024-02-16 DIAGNOSIS — N261 Atrophy of kidney (terminal): Secondary | ICD-10-CM | POA: Diagnosis not present

## 2024-02-16 DIAGNOSIS — C678 Malignant neoplasm of overlapping sites of bladder: Secondary | ICD-10-CM | POA: Diagnosis not present

## 2024-02-16 DIAGNOSIS — C7951 Secondary malignant neoplasm of bone: Secondary | ICD-10-CM | POA: Diagnosis not present

## 2024-02-16 DIAGNOSIS — Z515 Encounter for palliative care: Secondary | ICD-10-CM | POA: Diagnosis not present

## 2024-02-16 NOTE — Telephone Encounter (Signed)
 [facility] Standard Written Order

## 2024-02-17 DIAGNOSIS — M4726 Other spondylosis with radiculopathy, lumbar region: Secondary | ICD-10-CM | POA: Diagnosis not present

## 2024-02-17 DIAGNOSIS — N1832 Chronic kidney disease, stage 3b: Secondary | ICD-10-CM | POA: Diagnosis not present

## 2024-02-17 DIAGNOSIS — N1330 Unspecified hydronephrosis: Secondary | ICD-10-CM | POA: Diagnosis not present

## 2024-02-17 DIAGNOSIS — I2510 Atherosclerotic heart disease of native coronary artery without angina pectoris: Secondary | ICD-10-CM | POA: Diagnosis not present

## 2024-02-17 DIAGNOSIS — G8220 Paraplegia, unspecified: Secondary | ICD-10-CM | POA: Diagnosis not present

## 2024-02-17 DIAGNOSIS — J4489 Other specified chronic obstructive pulmonary disease: Secondary | ICD-10-CM | POA: Diagnosis not present

## 2024-02-17 DIAGNOSIS — J9611 Chronic respiratory failure with hypoxia: Secondary | ICD-10-CM | POA: Diagnosis not present

## 2024-02-17 DIAGNOSIS — M8008XD Age-related osteoporosis with current pathological fracture, vertebra(e), subsequent encounter for fracture with routine healing: Secondary | ICD-10-CM | POA: Diagnosis not present

## 2024-02-17 DIAGNOSIS — M47814 Spondylosis without myelopathy or radiculopathy, thoracic region: Secondary | ICD-10-CM | POA: Diagnosis not present

## 2024-02-17 DIAGNOSIS — I420 Dilated cardiomyopathy: Secondary | ICD-10-CM | POA: Diagnosis not present

## 2024-02-17 DIAGNOSIS — M069 Rheumatoid arthritis, unspecified: Secondary | ICD-10-CM | POA: Diagnosis not present

## 2024-02-17 DIAGNOSIS — M899 Disorder of bone, unspecified: Secondary | ICD-10-CM | POA: Diagnosis not present

## 2024-02-17 DIAGNOSIS — L89312 Pressure ulcer of right buttock, stage 2: Secondary | ICD-10-CM | POA: Diagnosis not present

## 2024-02-17 DIAGNOSIS — F5101 Primary insomnia: Secondary | ICD-10-CM | POA: Diagnosis not present

## 2024-02-17 DIAGNOSIS — D631 Anemia in chronic kidney disease: Secondary | ICD-10-CM | POA: Diagnosis not present

## 2024-02-17 DIAGNOSIS — D630 Anemia in neoplastic disease: Secondary | ICD-10-CM | POA: Diagnosis not present

## 2024-02-17 DIAGNOSIS — I5042 Chronic combined systolic (congestive) and diastolic (congestive) heart failure: Secondary | ICD-10-CM | POA: Diagnosis not present

## 2024-02-17 DIAGNOSIS — E440 Moderate protein-calorie malnutrition: Secondary | ICD-10-CM | POA: Diagnosis not present

## 2024-02-17 DIAGNOSIS — E785 Hyperlipidemia, unspecified: Secondary | ICD-10-CM | POA: Diagnosis not present

## 2024-02-17 DIAGNOSIS — C679 Malignant neoplasm of bladder, unspecified: Secondary | ICD-10-CM | POA: Diagnosis not present

## 2024-02-17 DIAGNOSIS — Z936 Other artificial openings of urinary tract status: Secondary | ICD-10-CM | POA: Diagnosis not present

## 2024-02-17 DIAGNOSIS — D520 Dietary folate deficiency anemia: Secondary | ICD-10-CM | POA: Diagnosis not present

## 2024-02-17 NOTE — Progress Notes (Unsigned)
 [facility] OFFICE PROGRESS NOTE  Patient Care Team: Teressa Harrie HERO, FNP as PCP - General (Family Medicine) Inocencio Soyla Lunger, MD as PCP - Electrophysiology (Cardiology) Bernie Lamar PARAS, MD as PCP - Cardiology (Cardiology) Mavis Purchase, MD as Consulting Physician (Neurosurgery) Bernie Lamar PARAS, MD as Consulting Physician (Cardiology) Sheldon Standing, MD as Consulting Physician (General Surgery) Skeet Juliene SAUNDERS, DO as Consulting Physician (Neurology) Delores Lauraine NOVAK, [facility] (Inactive) as Pharmacist (Pharmacist) Saintclair Jasper, MD as Consulting Physician (Gastroenterology) Zehr, Harlene BIRCH, PA-C as Physician Assistant (Gastroenterology) Dennise Hoes, MD as Consulting Physician (Nephrology) Pickenpack-Cousar, Fannie SAILOR, NP as Nurse Practitioner [facility] and Palliative Medicine)  Type: phone only [facility]ician [facility] Patient location: in [facility].  68 y.o.female with past medical history of COPD, chronic back from from DDD, osteoporosis with fractures presenting with back pain radiating down to the hip in March of 2025 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  Cystoscopy with TURBT show right sided bladder tumor with palpable anterior vaginal mass.  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    Current diagnosis: Stage IVB UC with bone metastases Treatment:  12/18/23 C1D1 EV/P  Interval response to treatment after C3. Discussed with daughter. They canceled the appointment report too early. Report she has been improving weekly. She is tolerating treatment though it is palliative treatment and daughter said her mother would like to continue. Her PS is improving slowly. Will try to schedule later appointment.  With persistent bilateral leg swelling off diuretics, ok to resume lasix  20 mg daily this next week.  Neuropathy seems to resolved. They are considering having a trip to the beach. I  think that's great idea to allow more activities. Given response, will give day 1 on cycle 4 as scheduled on 7/2. Omit day 8. Return to see me on 7/24 with labs, appointment and Cycle 5 day 1. Assessment & Plan Urothelial carcinoma of bladder (HCC) Reduced dose of EV to 1mg /kg for neuropathy Seeing palliative care as well for chronic pain management Continue EVP as tolerated Follow up next week Low vitamin B12 level B12 1000 mcg daily Dietary folate deficiency anemia Folic acid  1 mg daily At risk for side effect of medication If having severe rash over 10% of body will call for evaluation and start Medrol  dose pack Monitor irAE.  Monitor for skin toxicity Prophylactic Desitin twice daily to intertriginous, flexural, and acral areas Apply emollient moisturizer twice a day Use sunscreen if outside for prolonged period of time. Hydration 64+ ounces a day Avoid hot showers Monitor for neuropathy Monitor for hyperglycemia.  Baseline A1c Monitor for signs of pneumonitis Warning signs: Malaise.  Fever of more than 100.4.  Mucosal involvement in the ocular, oral or genital area.  Skin pain, burning, numbness tingling. Monitor for diarrhea and other GI symptoms Vit D/calcium  daily  Chronic pain with new cancer diagnosis Will see palliative care with future treatment. Neuropathy Will only get day 1 this cycle at reduced dose of EV Chronic systolic congestive heart failure, NYHA class 2 (HCC) Resume lasix  20 mg daily this next week. Reassess next week. If s/o dehydration, will hold. Continue coreg .  I spent a total of 30 minutes including review of chart and various tests results, face-to-face time with the patient, discussions about results, plan of care and coordination of care plan with other providers and staff members.  Orders Placed This Encounter  Procedures   Magnesium     Standing  Status:   Future    Expected Date:   03/18/2024    Expiration Date:   03/18/2025   T4, free     Standing Status:   Future    Expected Date:   03/18/2024    Expiration Date:   03/18/2025   CBC with Differential (Cancer Center Only)    Standing Status:   Future    Expected Date:   03/18/2024    Expiration Date:   03/18/2025   CMP (Cancer Center only)    Standing Status:   Future    Expected Date:   03/18/2024    Expiration Date:   03/18/2025   CBC with Differential (Cancer Center Only)    Standing Status:   Future    Expected Date:   03/25/2024    Expiration Date:   03/25/2025   CMP (Cancer Center only)    Standing Status:   Future    Expected Date:   03/25/2024    Expiration Date:   03/25/2025   Magnesium     Standing Status:   Future    Expected Date:   04/08/2024    Expiration Date:   04/08/2025   T4, free    Standing Status:   Future    Expected Date:   04/08/2024    Expiration Date:   04/08/2025   CBC with Differential (Cancer Center Only)    Standing Status:   Future    Expected Date:   04/08/2024    Expiration Date:   04/08/2025   CMP (Cancer Center only)    Standing Status:   Future    Expected Date:   04/08/2024    Expiration Date:   04/08/2025   CBC with Differential (Cancer Center Only)    Standing Status:   Future    Expected Date:   04/15/2024    Expiration Date:   04/15/2025   CMP (Cancer Center only)    Standing Status:   Future    Expected Date:   04/15/2024    Expiration Date:   04/15/2025     [name], MD  INTERVAL HISTORY: Called patient but no answer. Spoke to daughter. Report hair loss. Some nausea and vomiting not daily controlled with medication. A little diarrhea and not need much lomotil . She only need imodium  every other day. No c/o neuropathy. No rash.  Leg swelling. She had lasix  yesterday. She has good u/o. Color is almost clear.  Planned for monthly at [facility]. No sx of UTI.  Oncology History  Urothelial carcinoma of bladder (HCC)  11/18/2023 Initial Diagnosis   Urothelial carcinoma of bladder (HCC)   12/01/2023 Cancer Staging   Staging form: Urinary  Bladder, AJCC 8th Edition - Clinical: Stage IVB (cTX, cNX, pM1b) - Signed by Chihuahua Pauletta JAYSON, MD on 12/01/2023 WHO/ISUP grade (low/high): High Grade Histologic grading system: 2 grade system   12/18/2023 -  Chemotherapy   12/18/23 C1D1 01/08/24 C2D1 01/29/24 C3D1. Pembro only. Day 8 reduced EV to 1mg /kg Patient is on Treatment Plan : UROTHELIAL ADVANCED, METASTATIC ENFORTUMAB D1, D8 + PEMBROLIZUMAB  (200) D1 Q21D      02/12/2024 PET scan   PET 1. Response to therapy of osseous metastasis. 2. The pelvis is again poorly evaluated. Soft tissue density within the deep pelvis is less distinct today with decreased hypermetabolism. Potential etiologies again include response to therapy of a synchronous uterine primary versus isolated metastasis (presuming the patient is status post hysterectomy). 3. Placement of a right ureteric stent with similar mild  right-sided hydronephrosis. The left-sided hydroureteronephrosis has resolved. 4.  Aortic Atherosclerosis (ICD10-I70.0).     Relevant data reviewed during this visit included labs, imaging. I reviewed her PET.

## 2024-02-18 DIAGNOSIS — N1832 Chronic kidney disease, stage 3b: Secondary | ICD-10-CM | POA: Diagnosis not present

## 2024-02-18 DIAGNOSIS — I5042 Chronic combined systolic (congestive) and diastolic (congestive) heart failure: Secondary | ICD-10-CM | POA: Diagnosis not present

## 2024-02-18 DIAGNOSIS — I420 Dilated cardiomyopathy: Secondary | ICD-10-CM | POA: Diagnosis not present

## 2024-02-18 DIAGNOSIS — N1330 Unspecified hydronephrosis: Secondary | ICD-10-CM | POA: Diagnosis not present

## 2024-02-18 DIAGNOSIS — D631 Anemia in chronic kidney disease: Secondary | ICD-10-CM | POA: Diagnosis not present

## 2024-02-18 DIAGNOSIS — J4489 Other specified chronic obstructive pulmonary disease: Secondary | ICD-10-CM | POA: Diagnosis not present

## 2024-02-18 DIAGNOSIS — M8008XD Age-related osteoporosis with current pathological fracture, vertebra(e), subsequent encounter for fracture with routine healing: Secondary | ICD-10-CM | POA: Diagnosis not present

## 2024-02-18 DIAGNOSIS — C679 Malignant neoplasm of bladder, unspecified: Secondary | ICD-10-CM | POA: Diagnosis not present

## 2024-02-18 DIAGNOSIS — Z936 Other artificial openings of urinary tract status: Secondary | ICD-10-CM | POA: Diagnosis not present

## 2024-02-18 DIAGNOSIS — I2510 Atherosclerotic heart disease of native coronary artery without angina pectoris: Secondary | ICD-10-CM | POA: Diagnosis not present

## 2024-02-18 DIAGNOSIS — M4726 Other spondylosis with radiculopathy, lumbar region: Secondary | ICD-10-CM | POA: Diagnosis not present

## 2024-02-18 DIAGNOSIS — D630 Anemia in neoplastic disease: Secondary | ICD-10-CM | POA: Diagnosis not present

## 2024-02-18 DIAGNOSIS — M069 Rheumatoid arthritis, unspecified: Secondary | ICD-10-CM | POA: Diagnosis not present

## 2024-02-18 DIAGNOSIS — E785 Hyperlipidemia, unspecified: Secondary | ICD-10-CM | POA: Diagnosis not present

## 2024-02-18 DIAGNOSIS — D520 Dietary folate deficiency anemia: Secondary | ICD-10-CM | POA: Diagnosis not present

## 2024-02-18 DIAGNOSIS — J9611 Chronic respiratory failure with hypoxia: Secondary | ICD-10-CM | POA: Diagnosis not present

## 2024-02-18 DIAGNOSIS — L89312 Pressure ulcer of right buttock, stage 2: Secondary | ICD-10-CM | POA: Diagnosis not present

## 2024-02-18 DIAGNOSIS — M47814 Spondylosis without myelopathy or radiculopathy, thoracic region: Secondary | ICD-10-CM | POA: Diagnosis not present

## 2024-02-18 DIAGNOSIS — E440 Moderate protein-calorie malnutrition: Secondary | ICD-10-CM | POA: Diagnosis not present

## 2024-02-18 DIAGNOSIS — F5101 Primary insomnia: Secondary | ICD-10-CM | POA: Diagnosis not present

## 2024-02-18 DIAGNOSIS — G8220 Paraplegia, unspecified: Secondary | ICD-10-CM | POA: Diagnosis not present

## 2024-02-18 DIAGNOSIS — M899 Disorder of bone, unspecified: Secondary | ICD-10-CM | POA: Diagnosis not present

## 2024-02-19 ENCOUNTER — Inpatient Hospital Stay: Admitting: Nutrition

## 2024-02-19 ENCOUNTER — Inpatient Hospital Stay

## 2024-02-19 ENCOUNTER — Other Ambulatory Visit

## 2024-02-19 ENCOUNTER — Inpatient Hospital Stay: Admitting: Nurse Practitioner

## 2024-02-19 DIAGNOSIS — C679 Malignant neoplasm of bladder, unspecified: Secondary | ICD-10-CM

## 2024-02-19 DIAGNOSIS — I5022 Chronic systolic (congestive) heart failure: Secondary | ICD-10-CM | POA: Diagnosis not present

## 2024-02-19 DIAGNOSIS — Z9189 Other specified personal risk factors, not elsewhere classified: Secondary | ICD-10-CM | POA: Diagnosis not present

## 2024-02-19 DIAGNOSIS — D520 Dietary folate deficiency anemia: Secondary | ICD-10-CM

## 2024-02-19 NOTE — Assessment & Plan Note (Addendum)
 If having severe rash over 10% of body will call for evaluation and start Medrol  dose pack Monitor irAE.  Monitor for skin toxicity Prophylactic Desitin twice daily to intertriginous, flexural, and acral areas Apply emollient moisturizer twice a day Use sunscreen if outside for prolonged period of time. Hydration 64+ ounces a day Avoid hot showers Monitor for neuropathy Monitor for hyperglycemia.  Baseline A1c Monitor for signs of pneumonitis Warning signs: Malaise.  Fever of more than 100.4.  Mucosal involvement in the ocular, oral or genital area.  Skin pain, burning, numbness tingling. Monitor for diarrhea and other GI symptoms Vit D/calcium  daily  Chronic pain with new cancer diagnosis Will see palliative care with future treatment.

## 2024-02-19 NOTE — Assessment & Plan Note (Addendum)
Folic acid 1mg daily

## 2024-02-19 NOTE — Assessment & Plan Note (Addendum)
 Will only get day 1 this cycle at reduced dose of EV

## 2024-02-19 NOTE — Assessment & Plan Note (Addendum)
 B12 1000 mcg daily

## 2024-02-19 NOTE — Assessment & Plan Note (Addendum)
 Resume lasix  20 mg daily this next week. Reassess next week. If s/o dehydration, will hold. Continue coreg .

## 2024-02-19 NOTE — Assessment & Plan Note (Addendum)
 Reduced dose of EV to 1mg /kg for neuropathy Seeing palliative care as well for chronic pain management Continue EVP as tolerated Follow up next week

## 2024-02-20 DIAGNOSIS — R339 Retention of urine, unspecified: Secondary | ICD-10-CM | POA: Diagnosis not present

## 2024-02-23 ENCOUNTER — Telehealth: Payer: Self-pay

## 2024-02-23 ENCOUNTER — Other Ambulatory Visit: Payer: Self-pay | Admitting: Family Medicine

## 2024-02-23 DIAGNOSIS — E785 Hyperlipidemia, unspecified: Secondary | ICD-10-CM

## 2024-02-23 DIAGNOSIS — M069 Rheumatoid arthritis, unspecified: Secondary | ICD-10-CM | POA: Diagnosis not present

## 2024-02-23 DIAGNOSIS — D520 Dietary folate deficiency anemia: Secondary | ICD-10-CM | POA: Diagnosis not present

## 2024-02-23 DIAGNOSIS — J9611 Chronic respiratory failure with hypoxia: Secondary | ICD-10-CM | POA: Diagnosis not present

## 2024-02-23 DIAGNOSIS — D631 Anemia in chronic kidney disease: Secondary | ICD-10-CM | POA: Diagnosis not present

## 2024-02-23 DIAGNOSIS — M47814 Spondylosis without myelopathy or radiculopathy, thoracic region: Secondary | ICD-10-CM | POA: Diagnosis not present

## 2024-02-23 DIAGNOSIS — I420 Dilated cardiomyopathy: Secondary | ICD-10-CM | POA: Diagnosis not present

## 2024-02-23 DIAGNOSIS — L89312 Pressure ulcer of right buttock, stage 2: Secondary | ICD-10-CM | POA: Diagnosis not present

## 2024-02-23 DIAGNOSIS — M899 Disorder of bone, unspecified: Secondary | ICD-10-CM | POA: Diagnosis not present

## 2024-02-23 DIAGNOSIS — C679 Malignant neoplasm of bladder, unspecified: Secondary | ICD-10-CM | POA: Diagnosis not present

## 2024-02-23 DIAGNOSIS — E440 Moderate protein-calorie malnutrition: Secondary | ICD-10-CM | POA: Diagnosis not present

## 2024-02-23 DIAGNOSIS — J4489 Other specified chronic obstructive pulmonary disease: Secondary | ICD-10-CM | POA: Diagnosis not present

## 2024-02-23 DIAGNOSIS — I2510 Atherosclerotic heart disease of native coronary artery without angina pectoris: Secondary | ICD-10-CM | POA: Diagnosis not present

## 2024-02-23 DIAGNOSIS — M8008XD Age-related osteoporosis with current pathological fracture, vertebra(e), subsequent encounter for fracture with routine healing: Secondary | ICD-10-CM | POA: Diagnosis not present

## 2024-02-23 DIAGNOSIS — F5101 Primary insomnia: Secondary | ICD-10-CM | POA: Diagnosis not present

## 2024-02-23 DIAGNOSIS — G8220 Paraplegia, unspecified: Secondary | ICD-10-CM | POA: Diagnosis not present

## 2024-02-23 DIAGNOSIS — N1832 Chronic kidney disease, stage 3b: Secondary | ICD-10-CM | POA: Diagnosis not present

## 2024-02-23 DIAGNOSIS — M4726 Other spondylosis with radiculopathy, lumbar region: Secondary | ICD-10-CM | POA: Diagnosis not present

## 2024-02-23 DIAGNOSIS — N1330 Unspecified hydronephrosis: Secondary | ICD-10-CM | POA: Diagnosis not present

## 2024-02-23 DIAGNOSIS — D630 Anemia in neoplastic disease: Secondary | ICD-10-CM | POA: Diagnosis not present

## 2024-02-23 DIAGNOSIS — I5042 Chronic combined systolic (congestive) and diastolic (congestive) heart failure: Secondary | ICD-10-CM | POA: Diagnosis not present

## 2024-02-23 DIAGNOSIS — Z936 Other artificial openings of urinary tract status: Secondary | ICD-10-CM | POA: Diagnosis not present

## 2024-02-23 NOTE — Telephone Encounter (Signed)
 Called the patient back and she confirmed appointment details.

## 2024-02-25 ENCOUNTER — Inpatient Hospital Stay (HOSPITAL_BASED_OUTPATIENT_CLINIC_OR_DEPARTMENT_OTHER): Admitting: Nurse Practitioner

## 2024-02-25 ENCOUNTER — Encounter: Payer: Self-pay | Admitting: Nurse Practitioner

## 2024-02-25 ENCOUNTER — Inpatient Hospital Stay

## 2024-02-25 ENCOUNTER — Inpatient Hospital Stay (HOSPITAL_BASED_OUTPATIENT_CLINIC_OR_DEPARTMENT_OTHER): Admitting: Physician Assistant

## 2024-02-25 VITALS — BP 94/52 | HR 85 | Temp 98.6°F | Resp 18 | Wt 147.0 lb

## 2024-02-25 DIAGNOSIS — D638 Anemia in other chronic diseases classified elsewhere: Secondary | ICD-10-CM | POA: Diagnosis not present

## 2024-02-25 DIAGNOSIS — D631 Anemia in chronic kidney disease: Secondary | ICD-10-CM | POA: Diagnosis not present

## 2024-02-25 DIAGNOSIS — C679 Malignant neoplasm of bladder, unspecified: Secondary | ICD-10-CM | POA: Diagnosis not present

## 2024-02-25 DIAGNOSIS — N1831 Chronic kidney disease, stage 3a: Secondary | ICD-10-CM | POA: Insufficient documentation

## 2024-02-25 DIAGNOSIS — R600 Localized edema: Secondary | ICD-10-CM | POA: Diagnosis not present

## 2024-02-25 DIAGNOSIS — I5022 Chronic systolic (congestive) heart failure: Secondary | ICD-10-CM | POA: Insufficient documentation

## 2024-02-25 DIAGNOSIS — R110 Nausea: Secondary | ICD-10-CM | POA: Insufficient documentation

## 2024-02-25 DIAGNOSIS — Z79899 Other long term (current) drug therapy: Secondary | ICD-10-CM | POA: Insufficient documentation

## 2024-02-25 DIAGNOSIS — Z5112 Encounter for antineoplastic immunotherapy: Secondary | ICD-10-CM | POA: Diagnosis not present

## 2024-02-25 DIAGNOSIS — C7951 Secondary malignant neoplasm of bone: Secondary | ICD-10-CM | POA: Insufficient documentation

## 2024-02-25 DIAGNOSIS — G893 Neoplasm related pain (acute) (chronic): Secondary | ICD-10-CM

## 2024-02-25 DIAGNOSIS — M8458XA Pathological fracture in neoplastic disease, other specified site, initial encounter for fracture: Secondary | ICD-10-CM

## 2024-02-25 DIAGNOSIS — Z79891 Long term (current) use of opiate analgesic: Secondary | ICD-10-CM | POA: Insufficient documentation

## 2024-02-25 DIAGNOSIS — R530 Neoplastic (malignant) related fatigue: Secondary | ICD-10-CM

## 2024-02-25 DIAGNOSIS — M5489 Other dorsalgia: Secondary | ICD-10-CM

## 2024-02-25 DIAGNOSIS — Z79620 Long term (current) use of immunosuppressive biologic: Secondary | ICD-10-CM | POA: Insufficient documentation

## 2024-02-25 DIAGNOSIS — D6481 Anemia due to antineoplastic chemotherapy: Secondary | ICD-10-CM | POA: Diagnosis not present

## 2024-02-25 DIAGNOSIS — I2510 Atherosclerotic heart disease of native coronary artery without angina pectoris: Secondary | ICD-10-CM | POA: Diagnosis not present

## 2024-02-25 DIAGNOSIS — Z87891 Personal history of nicotine dependence: Secondary | ICD-10-CM | POA: Diagnosis not present

## 2024-02-25 DIAGNOSIS — R197 Diarrhea, unspecified: Secondary | ICD-10-CM | POA: Diagnosis not present

## 2024-02-25 DIAGNOSIS — Z515 Encounter for palliative care: Secondary | ICD-10-CM | POA: Diagnosis not present

## 2024-02-25 DIAGNOSIS — G8929 Other chronic pain: Secondary | ICD-10-CM

## 2024-02-25 LAB — CMP (CANCER CENTER ONLY)
ALT: 7 U/L (ref 0–44)
AST: 15 U/L (ref 15–41)
Albumin: 2.7 g/dL — ABNORMAL LOW (ref 3.5–5.0)
Alkaline Phosphatase: 74 U/L (ref 38–126)
Anion gap: 6 (ref 5–15)
BUN: 28 mg/dL — ABNORMAL HIGH (ref 8–23)
CO2: 26 mmol/L (ref 22–32)
Calcium: 9 mg/dL (ref 8.9–10.3)
Chloride: 105 mmol/L (ref 98–111)
Creatinine: 1.5 mg/dL — ABNORMAL HIGH (ref 0.44–1.00)
GFR, Estimated: 37 mL/min — ABNORMAL LOW (ref >60)
Glucose, Bld: 85 mg/dL (ref 70–99)
Potassium: 4 mmol/L (ref 3.5–5.1)
Sodium: 137 mmol/L (ref 135–145)
Total Bilirubin: 0.4 mg/dL (ref 0.0–1.2)
Total Protein: 6 g/dL — ABNORMAL LOW (ref 6.5–8.1)

## 2024-02-25 LAB — CBC WITH DIFFERENTIAL (CANCER CENTER ONLY)
Abs Immature Granulocytes: 0.03 10*3/uL (ref 0.00–0.07)
Basophils Absolute: 0.1 10*3/uL (ref 0.0–0.1)
Basophils Relative: 1 %
Eosinophils Absolute: 1.4 10*3/uL — ABNORMAL HIGH (ref 0.0–0.5)
Eosinophils Relative: 16 %
HCT: 25.7 % — ABNORMAL LOW (ref 36.0–46.0)
Hemoglobin: 8.3 g/dL — ABNORMAL LOW (ref 12.0–15.0)
Immature Granulocytes: 0 %
Lymphocytes Relative: 16 %
Lymphs Abs: 1.4 10*3/uL (ref 0.7–4.0)
MCH: 30.4 pg (ref 26.0–34.0)
MCHC: 32.3 g/dL (ref 30.0–36.0)
MCV: 94.1 fL (ref 80.0–100.0)
Monocytes Absolute: 0.8 10*3/uL (ref 0.1–1.0)
Monocytes Relative: 10 %
Neutro Abs: 5 10*3/uL (ref 1.7–7.7)
Neutrophils Relative %: 57 %
Platelet Count: 271 10*3/uL (ref 150–400)
RBC: 2.73 MIL/uL — ABNORMAL LOW (ref 3.87–5.11)
RDW: 17 % — ABNORMAL HIGH (ref 11.5–15.5)
WBC Count: 8.7 10*3/uL (ref 4.0–10.5)
nRBC: 0 % (ref 0.0–0.2)

## 2024-02-25 LAB — MAGNESIUM: Magnesium: 1.8 mg/dL (ref 1.7–2.4)

## 2024-02-25 MED ORDER — SODIUM CHLORIDE 0.9 % IV SOLN
1.0000 mg/kg | Freq: Once | INTRAVENOUS | Status: AC
  Administered 2024-02-25: 70 mg via INTRAVENOUS
  Filled 2024-02-25: qty 3

## 2024-02-25 MED ORDER — HEPARIN SOD (PORK) LOCK FLUSH 100 UNIT/ML IV SOLN
500.0000 [IU] | Freq: Once | INTRAVENOUS | Status: AC | PRN
  Administered 2024-02-25: 500 [IU]

## 2024-02-25 MED ORDER — OXYCODONE HCL 20 MG PO TABS: 20.0000 mg | ORAL_TABLET | ORAL | 0 refills | Status: DC | PRN

## 2024-02-25 MED ORDER — METHADONE HCL 10 MG PO TABS: 10.0000 mg | ORAL_TABLET | Freq: Three times a day (TID) | ORAL | 0 refills | Status: DC

## 2024-02-25 MED ORDER — PROCHLORPERAZINE MALEATE 10 MG PO TABS
10.0000 mg | ORAL_TABLET | Freq: Once | ORAL | Status: AC
  Administered 2024-02-25: 10 mg via ORAL
  Filled 2024-02-25: qty 1

## 2024-02-25 MED ORDER — SODIUM CHLORIDE 0.9% FLUSH
10.0000 mL | INTRAVENOUS | Status: DC | PRN
  Administered 2024-02-25: 10 mL

## 2024-02-25 MED ORDER — SODIUM CHLORIDE 0.9% FLUSH
10.0000 mL | Freq: Once | INTRAVENOUS | Status: AC
  Administered 2024-02-25: 10 mL

## 2024-02-25 MED ORDER — SODIUM CHLORIDE 0.9 % IV SOLN
INTRAVENOUS | Status: DC
Start: 2024-02-25 — End: 2024-02-25

## 2024-02-25 MED ORDER — SODIUM CHLORIDE 0.9 % IV SOLN
200.0000 mg | Freq: Once | INTRAVENOUS | Status: AC
  Administered 2024-02-25: 200 mg via INTRAVENOUS
  Filled 2024-02-25: qty 200

## 2024-02-25 NOTE — Patient Instructions (Signed)
 [facility] - A DEPT OF [facility]  Discharge Instructions: Thank you for choosing [facility] to provide your oncology and hematology care.   If you have a lab appointment with the Cancer Center, please go directly to the Cancer Center and check in at the registration area.   Wear comfortable clothing and clothing appropriate for easy access to any Portacath or PICC line.   We strive to give you quality time with your provider. You may need to reschedule your appointment if you arrive late (15 or more minutes).  Arriving late affects you and other patients whose appointments are after yours.  Also, if you miss three or more appointments without notifying the office, you may be dismissed from the clinic at the provider's discretion.      For prescription refill requests, have your pharmacy contact our office and allow 72 hours for refills to be completed.    Today you received the following chemotherapy and/or immunotherapy agents: Keytruda , Padcev       To help prevent nausea and vomiting after your treatment, we encourage you to take your nausea medication as directed.  BELOW ARE SYMPTOMS THAT SHOULD BE REPORTED IMMEDIATELY: *FEVER GREATER THAN 100.4 F (38 C) OR HIGHER *CHILLS OR SWEATING *NAUSEA AND VOMITING THAT IS NOT CONTROLLED WITH YOUR NAUSEA MEDICATION *UNUSUAL SHORTNESS OF BREATH *UNUSUAL BRUISING OR BLEEDING *URINARY PROBLEMS (pain or burning when urinating, or frequent urination) *BOWEL PROBLEMS (unusual diarrhea, constipation, pain near the anus) TENDERNESS IN MOUTH AND THROAT WITH OR WITHOUT PRESENCE OF ULCERS (sore throat, sores in mouth, or a toothache) UNUSUAL RASH, SWELLING OR PAIN  UNUSUAL VAGINAL DISCHARGE OR ITCHING   Items with * indicate a potential emergency and should be followed up as soon as possible or go to the Emergency Department if any problems should occur.  Please show the CHEMOTHERAPY ALERT CARD or  IMMUNOTHERAPY ALERT CARD at check-in to the Emergency Department and triage nurse.  Should you have questions after your visit or need to cancel or reschedule your appointment, please contact [facility] - A DEPT OF [facility]  Dept: [phone]  and follow the prompts.  Office hours are 8:00 a.m. to 4:30 p.m. Monday - Friday. Please note that voicemails left after 4:00 p.m. may not be returned until the following business day.  We are closed weekends and major holidays. You have access to a nurse at all times for urgent questions. Please call the main number to the clinic Dept: [phone] and follow the prompts.   For any non-urgent questions, you may also contact your provider using MyChart. We now offer e-Visits for anyone 23 and older to request care online for non-urgent symptoms. For details visit [email or web address].   Also download the MyChart app! Go to the app store, search MyChart, open the app, select [place], and log in with your MyChart username and password.

## 2024-02-25 NOTE — Progress Notes (Signed)
 Palliative Medicine [facility]  Telephone:([phone]) [phone] Fax:([phone]) [phone]   Name: [name] Qzmiwj Date: 02/25/2024 MRN: [id number]  DOB: 1956/07/24  Patient Care Team: Teressa Harrie HERO, FNP as PCP - General (Family Medicine) Inocencio Soyla Lunger, MD as PCP - Electrophysiology (Cardiology) Bernie Lamar PARAS, MD as PCP - Cardiology (Cardiology) Mavis Purchase, MD as Consulting Physician (Neurosurgery) Bernie Lamar PARAS, MD as Consulting Physician (Cardiology) Sheldon Standing, MD as Consulting Physician (General Surgery) Skeet Juliene SAUNDERS, DO as Consulting Physician (Neurology) Delores Lauraine [name], [facility] (Inactive) as Pharmacist (Pharmacist) Saintclair Jasper, MD as Consulting Physician (Gastroenterology) Zehr, Harlene BIRCH, PA-C as Physician Assistant (Gastroenterology) Dennise Hoes, MD as Consulting Physician (Nephrology) Pickenpack-Cousar, Fannie SAILOR, NP as Nurse Practitioner (Hospice and Palliative Medicine)    INTERVAL HISTORY: [name] is a 68 y.o. female with oncologic medical history including metastatic bladder cancer with T5 bone metastasis, COPD, chronic back pain from DDD, osteoporosis with fractures, chronic pain management at [facility] medical.  Palliative is seeing patient for symptom management and goals of care.   SOCIAL HISTORY:     reports that she quit smoking about 3 years ago. Her smoking use included cigarettes. She started smoking about 33 years ago. She has a 15 pack-year smoking history. She has never used smokeless tobacco. She reports current alcohol use of about 2.0 standard drinks of alcohol per week. She reports that she does not use drugs.  ADVANCE DIRECTIVES:  None on file   CODE STATUS: Full code  PAST MEDICAL HISTORY: Past Medical History:  Diagnosis Date   Abdominal pain 10/17/2020   Abnormality of gait due to impairment of balance 11/22/2020   Acute diverticulitis 10/17/2020   Admission for long-term opiate analgesic use 10/24/2019    Arthritis of right hip 05/29/2016   Formatting of this note might be different from the original. Added automatically from request for surgery [id number]   BMI 26.0-26.9,adult 03/29/2020   Bronchial asthma    Cardiomyopathy (HCC)    Overview:  Ejection fraction 45% in 2015 Ejection fraction 30 to 35% in November 2018   Chronic back pain    Chronic constipation 07/31/2021   Chronic hypoxemic respiratory failure (HCC) 10/24/2019   Chronic narcotic use 03/23/2015   Chronic pain of both knees 12/21/2018   Added automatically from request for surgery [id number]  Formatting of this note might be different from the original. 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   Hypotension 05/17/2020   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   Ileus following gastrointestinal surgery (HCC) 03/26/2015   Major depressive disorder, single episode, moderate (HCC) 10/24/2019   Malnutrition of moderate degree (HCC) 10/20/2020   Mesenteric ischemia (HCC) 10/16/2020   Migraine without aura with status migrainosus 10/24/2019   Mixed incontinence 10/20/2020   Myocardial infarction (HCC)  Nausea & vomiting    Obstructive chronic bronchitis with exacerbation (HCC)  10/25/2019   Other spondylosis with radiculopathy, lumbar region 10/24/2019   Persistent vomiting 05/17/2020   Presence of left artificial hip joint 10/24/2019   PTSD (post-traumatic stress disorder)    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 03/23/2015 04/19/2014   Senile osteoporosis 10/24/2019   Sepsis (HCC) 10/17/2020   Serosanguineous chronic otitis media of right ear 08/02/2021   Small bowel obstruction (HCC)    Thyroid  disease    Upper respiratory tract infection due to COVID-19 virus 07/06/2021   Wellness examination 03/27/2021    ALLERGIES:  is allergic to lyrica  [pregabalin ].  MEDICATIONS:  Current Outpatient Medications  Medication Sig Dispense Refill   ARIPiprazole  (ABILIFY ) 5 MG tablet TAKE 1 TABLET BY MOUTH DAILY 30 tablet 10   aspirin  81 MG tablet Take 81 mg by mouth daily.     BREO ELLIPTA  100-25 MCG/ACT AEPB INHALE 1 PUFF INTO THE LUNGS DAILY. (Patient taking differently: Inhale 1 puff into the lungs daily as needed (SOB).) 60 each 10   Carboxymethylcell-Glycerin  PF 0.5-0.9 % SOLN Place 2 drops into both eyes 4 (four) times daily. 1 each 11   carvedilol  (COREG ) 3.125 MG tablet TAKE ONE (1) TABLET BY MOUTH TWICE DAILY WITH MEALS (Patient taking differently: Take 3.125 mg by mouth 2 (two) times daily with a meal.) 60 tablet 10   denosumab  (PROLIA ) 60 MG/ML SOSY injection Inject 60 mg into the skin every 6 (six) months. 180 mL 2   dextromethorphan  15 MG/5ML syrup Take 10 mLs (30 mg total) by mouth 4 (four) times daily as needed for cough. 120 mL 0   diclofenac  Sodium (VOLTAREN ) 1 % GEL Apply 2 g topically 4 (four) times daily. (Patient taking differently: Apply 2 g topically as needed (Pain).) 50 g 4   diphenoxylate -atropine  (LOMOTIL ) 2.5-0.025 MG tablet Take 1 tablet by mouth 4 (four) times daily as needed for diarrhea or loose stools. 60 tablet 0   dronabinol  (MARINOL ) 5 MG capsule Take 1 capsule (5 mg total) by mouth 2 (two) times daily  before a meal. 60 capsule 0   ENTRESTO  24-26 MG TAKE ONE (1) TABLET BY MOUTH TWICE DAILY (Patient taking differently: Take 1 tablet by mouth 2 (two) times daily.) 60 tablet 10   EPINEPHRINE  0.3 mg/0.3 mL IJ SOAJ injection Inject 0.3 mLs (0.3 mg total) into the muscle as needed for anaphylaxis. 1 each 2   esomeprazole  (NEXIUM ) 20 MG capsule TAKE ONE (1) CAPSULE BY MOUTH ONCE DAILY 90 capsule 3   FARXIGA 5 MG TABS tablet Take 5 mg by mouth daily.     ferrous sulfate  325 (65 FE) MG tablet Take 1 tablet (325 mg total) by mouth at bedtime.     fluticasone  (FLONASE ) 50 MCG/ACT nasal spray USE 2 SPRAYS IN EACH NOSTRILS DAILY AS NEEDED (Patient taking differently: Place 2 sprays into both nostrils daily as needed for allergies or rhinitis.) 48 g 0   folic acid  (FOLVITE ) 1 MG tablet Take 1 tablet (1 mg total) by mouth daily.     ipratropium-albuterol  (DUONEB) 0.5-2.5 (3) MG/3ML SOLN USE 1 VIAL VIA NEBULIZER EVERY 6 HOURS AS NEEDED FOR SHORTNESS OF BREATH (Patient taking differently: Take 3 mLs by nebulization every 6 (six) hours as needed (SOB).) 90 mL 0   levocetirizine (XYZAL) 5 MG tablet Take 5 mg by mouth daily as needed for allergies.     lidocaine  (LIDODERM ) 5 % Place 1 patch onto  the skin daily. Remove & Discard patch within 12 hours or as directed by MD     lidocaine -prilocaine  (EMLA ) cream Apply to affected area once (Patient taking differently: Apply 1 Application topically once. Apply to affected area once) 30 g 3   lubiprostone  (AMITIZA ) 24 MCG capsule TAKE 1 CAPSULE(24 MCG) BY MOUTH TWICE DAILY WITH A MEAL (Patient taking differently: Take 24 mcg by mouth 2 (two) times daily with a meal.) 180 capsule 1   methadone  (DOLOPHINE ) 10 MG tablet Take 1 tablet (10 mg total) by mouth every 8 (eight) hours. 90 tablet 0   methylPREDNISolone  (MEDROL  DOSEPAK) 4 MG TBPK tablet If significant rash, will start at 6 tabs on day 1, then 5 tabs on day 2, then 4 tabs on day 3, then 3 tabs on day 4, then 2 tabs on day  5 and then 1 tab on day 6. Take with food. 21 tablet 0   metoCLOPramide  (REGLAN ) 10 MG tablet Take 1 tablet (10 mg total) by mouth every 8 (eight) hours as needed for nausea. 45 tablet 1   Multiple Vitamin (MULTIVITAMIN WITH MINERALS) TABS tablet Take 1 tablet by mouth daily.     naloxone  (NARCAN ) nasal spray 4 mg/0.1 mL Place 1 spray into the nose once.     nitroGLYCERIN  (NITROSTAT ) 0.4 MG SL tablet Place 1 tablet (0.4 mg total) under the tongue every 5 (five) minutes as needed for chest pain. 25 tablet 6   ondansetron  (ZOFRAN ) 4 MG tablet Take 1 tablet (4 mg total) by mouth every 8 (eight) hours as needed for nausea or vomiting. 20 tablet 0   ondansetron  (ZOFRAN ) 8 MG tablet Take 1 tablet (8 mg total) by mouth every 8 (eight) hours as needed for nausea or vomiting. 30 tablet 1   Oxycodone  HCl 20 MG TABS Take 1 tablet (20 mg total) by mouth every 4 (four) hours as needed. 90 tablet 0   prochlorperazine  (COMPAZINE ) 10 MG tablet Take 1 tablet (10 mg total) by mouth every 6 (six) hours as needed for nausea or vomiting. 30 tablet 1   ranolazine  (RANEXA ) 500 MG 12 hr tablet TAKE 1 TABLET BY MOUTH TWICE DAILY 60 tablet 10   rosuvastatin  (CRESTOR ) 20 MG tablet TAKE 1 TABLET BY MOUTH ONCE DAILY 90 tablet 1   sertraline  (ZOLOFT ) 100 MG tablet TAKE 1/2 TABLET(50 MG) BY MOUTH DAILY (Patient taking differently: Take 50 mg by mouth daily.) 45 tablet 1   sulfamethoxazole -trimethoprim  (BACTRIM  DS) 800-160 MG tablet Take 1 tablet by mouth 2 (two) times daily. 14 tablet 0   tiZANidine  (ZANAFLEX ) 4 MG tablet Take 1 tablet (4 mg total) by mouth at bedtime. 30 tablet 3   topiramate  (TOPAMAX ) 50 MG tablet TAKE TWO (2) TABLETS BY MOUTH EVERY DAY AT BEDTIME (Patient taking differently: Take 50 mg by mouth at bedtime. TAKE TWO (2) TABLETS BY MOUTH EVERY DAY AT BEDTIME) 60 tablet 10   Ubrogepant  (UBRELVY ) 100 MG TABS Take 1 tablet (100 mg total) by mouth as needed (May repeat after 2 hours.  Maximum 2 tablets in 24 hours).  TAKE 1 TABLET BY MOUTH BY MOUTH AS NEEDED( MAY REPEAT 1 TABLET AFTER 2 HOURS IF NEEDED, MAXIMUM 2 TABLETS IN 24 HOURS) Strength: 100 mg 48 tablet 1   Vitamin D , Ergocalciferol , (DRISDOL ) 1.25 MG (50000 UNIT) CAPS capsule TAKE 1 CAPSULE BY MOUTH EVERY 7 DAYS FOR 12 DOSES (Patient taking differently: Take 50,000 Units by mouth every 7 (seven) days. Patient usually takes this medication on  mondays) 12 capsule 0   No current facility-administered medications for this visit.    VITAL SIGNS: There were no vitals taken for this visit. There were no vitals filed for this visit.  Estimated body mass index is 30.73 kg/m as calculated from the following:   Height as of 12/24/23: 4' 10 (1.473 m).   Weight as of an earlier encounter on 02/25/24: 147 lb 0.8 oz (66.7 kg).  Assessment NAD, resting in bed Normal breathing pattern RRR AAO x3  PERFORMANCE STATUS (ECOG) : 3 - Symptomatic, >50% confined to bed  IMPRESSION: Discussed the use of AI scribe software for clinical note transcription with the patient, who gave verbal consent to proceed.  History of Present Illness [name] is a 68 year old female with chronic pain and recent diagnosis of metastatic bladder cancer with T5 bone involvement who presents for symptom management follow-up. She is accompanied by her daughter [name].  Denies concerns with uncontrolled nausea, vomiting, or constipation. Her appetite has improved, and she is eating well. Her daughter mentioned that she was able to eat a sub sandwich recently, indicating a good appetite. Hopeful that they can travel and see family soon given patient's improvement. They share appreciation of good reports on recent scans from Oncologist.   She has been experiencing bowel movement irregularities, with episodes of diarrhea. She uses Imodium  to manage these episodes, which has helped stabilize her bowel movements. Her daughter notes that there are still days with diarrhea.  We discussed Ms.  Kegg's pain at length. Patient previously self discontinued her methadone  after reporting no change in pain. Patient reports it works well in conjunction with oxycodone  with noticeable improvement in her pain intensity. She is now taking methadone  regularly and finds it effective. Will continue with methadone  10mg  every 8 hours with oxycodone  20mg  as needed for severe and breakthrough pain.   We will continue to closely follow and support. All questions answered.    I discussed the importance of continued conversation with family and their medical providers regarding overall plan of care and treatment options, ensuring decisions are within the context of the patients values and GOCs. Assessment & Plan Diarrhea Improved with less episodes. Controlled with medication as needed.  - Continue Imodium  as needed.       - Monitor for dehydration.  Nausea and Appetite  Improved.  - Continue Reglan  every 6 hours as needed  Chronic pain management Chronic abdominal pain worsened prompting patient to restart methadone . Methadone  effective with regular oxycodone  use.  - Refill methadone , oxycodone , tizanidine . - Continue with oxycodone  to 20 mg every 4 hours as needed. - Continue Methadone  10mg  every 8 hours.   Follow-up I will plan to see patient back in 4-6 weeks.   Patient expressed understanding and was in agreement with this plan. She also understands that She can call the clinic at any time with any questions, concerns, or complaints.   Any controlled substances utilized were prescribed in the context of palliative care. PDMP has been reviewed.   Visit consisted of counseling and education dealing with the complex and emotionally intense issues of symptom management and palliative care in the setting of serious and potentially life-threatening illness.  Levon Borer, AGPCNP-BC  Palliative Medicine Team/[facility]

## 2024-02-26 ENCOUNTER — Encounter: Admitting: Nutrition

## 2024-02-26 ENCOUNTER — Telehealth: Payer: Self-pay

## 2024-02-26 NOTE — Telephone Encounter (Signed)
Scheduled appointments with the patient

## 2024-02-26 NOTE — Progress Notes (Signed)
 [facility] [phone]) [phone]   [phone]) [phone]  PROGRESS NOTE  Patient Care Team: Teressa Harrie HERO, FNP as PCP - General (Family Medicine) Inocencio Soyla Lunger, MD as PCP - Electrophysiology (Cardiology) Bernie Lamar PARAS, MD as PCP - Cardiology (Cardiology) Mavis Purchase, MD as Consulting Physician (Neurosurgery) Bernie Lamar PARAS, MD as Consulting Physician (Cardiology) Sheldon Standing, MD as Consulting Physician (General Surgery) Skeet Juliene SAUNDERS, DO as Consulting Physician (Neurology) Delores Lauraine NOVAK, [facility] (Inactive) as Pharmacist (Pharmacist) Saintclair Jasper, MD as Consulting Physician (Gastroenterology) Zehr, Harlene BIRCH, PA-C as Physician Assistant (Gastroenterology) Dennise Hoes, MD as Consulting Physician (Nephrology) Pickenpack-Cousar, Fannie SAILOR, NP as Nurse Practitioner (Hospice and Palliative Medicine)   CHIEF COMPLAINTS/PURPOSE OF CONSULTATION:  Metastatic urothelial carcinoma of the bladder   Oncology History  Urothelial carcinoma of bladder (HCC)  11/18/2023 Initial Diagnosis   Urothelial carcinoma of bladder (HCC)   12/01/2023 Cancer Staging   Staging form: Urinary Bladder, AJCC 8th Edition - Clinical: Stage IVB (cTX, cNX, pM1b) - Signed by Tina Pauletta BROCKS, MD on 12/01/2023 WHO/ISUP grade (low/high): High Grade Histologic grading system: 2 grade system   12/18/2023 -  Chemotherapy   12/18/23 C1D1 01/08/24 C2D1 01/29/24 C3D1. Pembro only. Day 8 reduced EV to 1mg /kg Patient is on Treatment Plan : UROTHELIAL ADVANCED, METASTATIC ENFORTUMAB D1, D8 + PEMBROLIZUMAB  (200) D1 Q21D      02/12/2024 PET scan   PET 1. Response to therapy of osseous metastasis. 2. The pelvis is again poorly evaluated. Soft tissue density within the deep pelvis is less distinct today with decreased hypermetabolism. Potential etiologies again include response to therapy of a synchronous uterine primary versus isolated metastasis (presuming the patient is status post  hysterectomy). 3. Placement of a right ureteric stent with similar mild right-sided hydronephrosis. The left-sided hydroureteronephrosis has resolved. 4.  Aortic Atherosclerosis (ICD10-I70.0).    CURRENT TREATMENT: Enfortumab plus Pembrolizumab   INTERVAL HISTORY:  [name] 68 y.o. female returns for a follow up visit prior to Cycle 4, Day 1 of Enfortumab plus Pembrolizumab . She is accompanied by her daughter for this visit.   On exam today, [name] is slowly gaining more strength and is trying to be more ambulatory.  She needs assistance to help with ambulation including the use of a walker.  She reports her appetite is improving as well.  Her nausea is better controlled without any vomiting episodes.  Her bowel movements are more regular. She denies easy bruising or signs of bleeding. She continues to have lower extremity with minimal improve after elevation. She denies fevers, chills, sweats, shortness of breath, chest pain or cough. She has no other complaints. Rest of the ROS is below.   MEDICAL HISTORY:  Past Medical History:  Diagnosis Date   Abdominal pain 10/17/2020   Abnormality of gait due to impairment of balance 11/22/2020   Acute diverticulitis 10/17/2020   Admission for long-term opiate analgesic use 10/24/2019   Arthritis of right hip 05/29/2016   Formatting of this note might be different from the original. Added automatically from request for surgery [id number]   BMI 26.0-26.9,adult 03/29/2020   Bronchial asthma    Cardiomyopathy (HCC)    Overview:  Ejection fraction 45% in 2015 Ejection fraction 30 to 35% in November 2018   Chronic back pain    Chronic constipation 07/31/2021   Chronic hypoxemic respiratory failure (HCC) 10/24/2019   Chronic narcotic use 03/23/2015   Chronic pain of both knees 12/21/2018   Added automatically from request for surgery [id number]  Formatting of this note might be different from the original. 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   Hypotension 05/17/2020   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   Ileus following gastrointestinal surgery (HCC) 03/26/2015   Major depressive disorder, single episode, moderate (HCC) 10/24/2019   Malnutrition of moderate degree (HCC) 10/20/2020   Mesenteric ischemia (HCC) 10/16/2020   Migraine without aura with status migrainosus 10/24/2019   Mixed incontinence 10/20/2020   Myocardial infarction (HCC)    Nausea & vomiting    Obstructive chronic bronchitis with exacerbation (HCC) 10/25/2019   Other spondylosis with radiculopathy, lumbar region 10/24/2019   Persistent vomiting 05/17/2020   Presence of left artificial hip joint 10/24/2019   PTSD (post-traumatic stress disorder)    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 03/23/2015 04/19/2014   Senile osteoporosis 10/24/2019   Sepsis (HCC) 10/17/2020   Serosanguineous chronic otitis media of right ear 08/02/2021   Small bowel obstruction (HCC)    Thyroid  disease    Upper respiratory tract infection due to COVID-19 virus 07/06/2021   Wellness examination 03/27/2021    SURGICAL HISTORY: Past Surgical History:  Procedure Laterality Date    APPENDECTOMY     CARDIAC CATHETERIZATION     CARPAL TUNNEL RELEASE     CESAREAN SECTION     CHF s/p AICD   12/2010   CHOLECYSTECTOMY     COLONOSCOPY  10/16/2015   Mild colonic diverticulosis, predominantly in the left colon. Small internal hemrrhoids. Otherwise normal colonoscopy   COLONOSCOPY WITH PROPOFOL  N/A 09/27/2021   Procedure: COLONOSCOPY WITH PROPOFOL ;  Surgeon: Shila Gustav GAILS, MD;  Location: [facility];  Service: Endoscopy;  Laterality: N/A;   CORONARY ANGIOPLASTY     ESOPHAGOGASTRODUODENOSCOPY  02/04/2014   Mild gastritis. Otherwise normal EGD   HAND SURGERY Bilateral    ICD GENERATOR CHANGEOUT N/A 05/21/2019   Procedure: ICD GENERATOR CHANGEOUT;  Surgeon: Inocencio Soyla Lunger, MD;  Location: [facility] INVASIVE CV LAB;  Service: Cardiovascular;  Laterality: N/A;   ICD IMPLANT     Medtronic   intestinal blockage 2011     IR IMAGING GUIDED PORT INSERTION  12/24/2023   IR NEPHROSTOMY PLACEMENT RIGHT  11/16/2023   IR URETERAL STENT PLACEMENT EXISTING ACCESS RIGHT  12/24/2023   LAPAROSCOPIC ASSISTED VENTRAL HERNIA REPAIR N/A 03/23/2015   Procedure: LAPAROSCOPIC VENTRAL WALL HERNIA REPAIR;  Surgeon: Elspeth Schultze, MD;  Location: [facility];  Service: General;  Laterality: N/A;  With MESH   LAPAROSCOPIC LYSIS OF ADHESIONS N/A 03/23/2015   Procedure: LAPAROSCOPIC LYSIS OF ADHESIONS;  Surgeon: Elspeth Schultze, MD;  Location: [facility];  Service: General;  Laterality: N/A;   LSCS      x2   NASAL SEPTUM SURGERY     NECK SURGERY     fused   TONSILLECTOMY  TRANSURETHRAL RESECTION OF BLADDER TUMOR N/A 11/16/2023   Procedure: TURBT (TRANSURETHRAL RESECTION OF BLADDER TUMOR);  Surgeon: [name] Lonni Righter, MD;  Location: [facility];  Service: Urology;  Laterality: N/A;   TYMPANOSTOMY TUBE PLACEMENT Right 2024   ULNAR NERVE TRANSPOSITION  01/23/2012   Procedure: ULNAR NERVE DECOMPRESSION/TRANSPOSITION;  Surgeon: Reyes JONETTA Budge, MD;  Location: [facility];  Service: Neurosurgery;   Laterality: Left;  LEFT ulnar nerve decompression    SOCIAL HISTORY: Social History   Socioeconomic History   Marital status: Single    Spouse name: Not on file   Number of children: 3   Years of education: Not on file   Highest education level: 10th grade  Occupational History   Occupation: disabled  Tobacco Use   Smoking status: Former    Current packs/day: 0.00    Average packs/day: 0.5 packs/day for 30.0 years (15.0 ttl pk-yrs)    Types: Cigarettes    Start date: 03/1990    Quit date: 03/2020    Years since quitting: 3.9   Smokeless tobacco: Never  Vaping Use   Vaping status: Never Used  Substance and Sexual Activity   Alcohol use: Yes    Alcohol/week: 2.0 standard drinks of alcohol    Types: 1 Cans of beer, 1 Standard drinks or equivalent per week    Comment: seldom   Drug use: No   Sexual activity: Not Currently  Other Topics Concern   Not on file  Social History Narrative   One level home with boyfriend   Caffeine - coffee 1-2 cups/day; Green tea 4-5 bottles a day   Exercise - some    Right handed      Social Drivers of Health   Financial Resource Strain: Low Risk  (04/05/2023)   Overall Financial Resource Strain (CARDIA)    Difficulty of Paying Living Expenses: Not very hard  Food Insecurity: No Food Insecurity (11/15/2023)   Hunger Vital Sign    Worried About Running Out of Food in the Last Year: Never true    Ran Out of Food in the Last Year: Never true  Transportation Needs: Unmet Transportation Needs (11/15/2023)   PRAPARE - Administrator, Civil Service (Medical): Yes    Lack of Transportation (Non-Medical): No  Physical Activity: Insufficiently Active (04/05/2023)   Exercise Vital Sign    Days of Exercise per Week: 3 days    Minutes of Exercise per Session: 20 min  Stress: Stress Concern Present (04/05/2023)   [employer] of Occupational Health - Occupational Stress Questionnaire    Feeling of Stress : Very much  Social  Connections: Moderately Isolated (11/15/2023)   Social Connection and Isolation Panel    Frequency of Communication with Friends and Family: More than three times a week    Frequency of Social Gatherings with Friends and Family: More than three times a week    Attends Religious Services: 1 to 4 times per year    Active Member of [employer] or Organizations: No    Attends Banker Meetings: Never    Marital Status: Divorced  Catering manager Violence: Not At Risk (11/15/2023)   Humiliation, Afraid, Rape, and Kick questionnaire    Fear of Current or Ex-Partner: No    Emotionally Abused: No    Physically Abused: No    Sexually Abused: No    FAMILY HISTORY: Family History  Problem Relation Age of Onset   Hypertension Mother    Heart attack Mother    Alcohol  abuse Father    Hypertension Father    Heart attack Father    Heart attack Other    Cancer Other    Heart failure Other    Anesthesia problems Neg Hx    Hypotension Neg Hx    Malignant hyperthermia Neg Hx    Pseudochol deficiency Neg Hx    Breast cancer Neg Hx     ALLERGIES:  is allergic to lyrica  [pregabalin ].  MEDICATIONS:  Current Outpatient Medications  Medication Sig Dispense Refill   ARIPiprazole  (ABILIFY ) 5 MG tablet TAKE 1 TABLET BY MOUTH DAILY 30 tablet 10   aspirin  81 MG tablet Take 81 mg by mouth daily.     BREO ELLIPTA  100-25 MCG/ACT AEPB INHALE 1 PUFF INTO THE LUNGS DAILY. (Patient taking differently: Inhale 1 puff into the lungs daily as needed (SOB).) 60 each 10   Carboxymethylcell-Glycerin  PF 0.5-0.9 % SOLN Place 2 drops into both eyes 4 (four) times daily. 1 each 11   carvedilol  (COREG ) 3.125 MG tablet TAKE ONE (1) TABLET BY MOUTH TWICE DAILY WITH MEALS (Patient taking differently: Take 3.125 mg by mouth 2 (two) times daily with a meal.) 60 tablet 10   denosumab  (PROLIA ) 60 MG/ML SOSY injection Inject 60 mg into the skin every 6 (six) months. 180 mL 2   dextromethorphan  15 MG/5ML syrup Take 10 mLs (30  mg total) by mouth 4 (four) times daily as needed for cough. 120 mL 0   diclofenac  Sodium (VOLTAREN ) 1 % GEL Apply 2 g topically 4 (four) times daily. (Patient taking differently: Apply 2 g topically as needed (Pain).) 50 g 4   diphenoxylate -atropine  (LOMOTIL ) 2.5-0.025 MG tablet Take 1 tablet by mouth 4 (four) times daily as needed for diarrhea or loose stools. 60 tablet 0   dronabinol  (MARINOL ) 5 MG capsule Take 1 capsule (5 mg total) by mouth 2 (two) times daily before a meal. 60 capsule 0   ENTRESTO  24-26 MG TAKE ONE (1) TABLET BY MOUTH TWICE DAILY (Patient taking differently: Take 1 tablet by mouth 2 (two) times daily.) 60 tablet 10   EPINEPHRINE  0.3 mg/0.3 mL IJ SOAJ injection Inject 0.3 mLs (0.3 mg total) into the muscle as needed for anaphylaxis. 1 each 2   esomeprazole  (NEXIUM ) 20 MG capsule TAKE ONE (1) CAPSULE BY MOUTH ONCE DAILY 90 capsule 3   FARXIGA 5 MG TABS tablet Take 5 mg by mouth daily.     ferrous sulfate  325 (65 FE) MG tablet Take 1 tablet (325 mg total) by mouth at bedtime.     fluticasone  (FLONASE ) 50 MCG/ACT nasal spray USE 2 SPRAYS IN EACH NOSTRILS DAILY AS NEEDED (Patient taking differently: Place 2 sprays into both nostrils daily as needed for allergies or rhinitis.) 48 g 0   folic acid  (FOLVITE ) 1 MG tablet Take 1 tablet (1 mg total) by mouth daily.     ipratropium-albuterol  (DUONEB) 0.5-2.5 (3) MG/3ML SOLN USE 1 VIAL VIA NEBULIZER EVERY 6 HOURS AS NEEDED FOR SHORTNESS OF BREATH (Patient taking differently: Take 3 mLs by nebulization every 6 (six) hours as needed (SOB).) 90 mL 0   levocetirizine (XYZAL) 5 MG tablet Take 5 mg by mouth daily as needed for allergies.     lidocaine  (LIDODERM ) 5 % Place 1 patch onto the skin daily. Remove & Discard patch within 12 hours or as directed by MD     lidocaine -prilocaine  (EMLA ) cream Apply to affected area once (Patient taking differently: Apply 1 Application topically once. Apply to affected area once) 30  g 3   lubiprostone  (AMITIZA )  24 MCG capsule TAKE 1 CAPSULE(24 MCG) BY MOUTH TWICE DAILY WITH A MEAL (Patient taking differently: Take 24 mcg by mouth 2 (two) times daily with a meal.) 180 capsule 1   methadone  (DOLOPHINE ) 10 MG tablet Take 1 tablet (10 mg total) by mouth every 8 (eight) hours. 90 tablet 0   methylPREDNISolone  (MEDROL  DOSEPAK) 4 MG TBPK tablet If significant rash, will start at 6 tabs on day 1, then 5 tabs on day 2, then 4 tabs on day 3, then 3 tabs on day 4, then 2 tabs on day 5 and then 1 tab on day 6. Take with food. 21 tablet 0   metoCLOPramide  (REGLAN ) 10 MG tablet Take 1 tablet (10 mg total) by mouth every 8 (eight) hours as needed for nausea. 45 tablet 1   Multiple Vitamin (MULTIVITAMIN WITH MINERALS) TABS tablet Take 1 tablet by mouth daily.     naloxone  (NARCAN ) nasal spray 4 mg/0.1 mL Place 1 spray into the nose once.     nitroGLYCERIN  (NITROSTAT ) 0.4 MG SL tablet Place 1 tablet (0.4 mg total) under the tongue every 5 (five) minutes as needed for chest pain. 25 tablet 6   ondansetron  (ZOFRAN ) 4 MG tablet Take 1 tablet (4 mg total) by mouth every 8 (eight) hours as needed for nausea or vomiting. 20 tablet 0   ondansetron  (ZOFRAN ) 8 MG tablet Take 1 tablet (8 mg total) by mouth every 8 (eight) hours as needed for nausea or vomiting. 30 tablet 1   Oxycodone  HCl 20 MG TABS Take 1 tablet (20 mg total) by mouth every 4 (four) hours as needed. 90 tablet 0   prochlorperazine  (COMPAZINE ) 10 MG tablet Take 1 tablet (10 mg total) by mouth every 6 (six) hours as needed for nausea or vomiting. 30 tablet 1   ranolazine  (RANEXA ) 500 MG 12 hr tablet TAKE 1 TABLET BY MOUTH TWICE DAILY 60 tablet 10   rosuvastatin  (CRESTOR ) 20 MG tablet TAKE 1 TABLET BY MOUTH ONCE DAILY 90 tablet 1   sertraline  (ZOLOFT ) 100 MG tablet TAKE 1/2 TABLET(50 MG) BY MOUTH DAILY (Patient taking differently: Take 50 mg by mouth daily.) 45 tablet 1   sulfamethoxazole -trimethoprim  (BACTRIM  DS) 800-160 MG tablet Take 1 tablet by mouth 2 (two) times  daily. 14 tablet 0   tiZANidine  (ZANAFLEX ) 4 MG tablet Take 1 tablet (4 mg total) by mouth at bedtime. 30 tablet 3   topiramate  (TOPAMAX ) 50 MG tablet TAKE TWO (2) TABLETS BY MOUTH EVERY DAY AT BEDTIME (Patient taking differently: Take 50 mg by mouth at bedtime. TAKE TWO (2) TABLETS BY MOUTH EVERY DAY AT BEDTIME) 60 tablet 10   Ubrogepant  (UBRELVY ) 100 MG TABS Take 1 tablet (100 mg total) by mouth as needed (May repeat after 2 hours.  Maximum 2 tablets in 24 hours). TAKE 1 TABLET BY MOUTH BY MOUTH AS NEEDED( MAY REPEAT 1 TABLET AFTER 2 HOURS IF NEEDED, MAXIMUM 2 TABLETS IN 24 HOURS) Strength: 100 mg 48 tablet 1   Vitamin D , Ergocalciferol , (DRISDOL ) 1.25 MG (50000 UNIT) CAPS capsule TAKE 1 CAPSULE BY MOUTH EVERY 7 DAYS FOR 12 DOSES (Patient taking differently: Take 50,000 Units by mouth every 7 (seven) days. Patient usually takes this medication on mondays) 12 capsule 0   No current facility-administered medications for this visit.    REVIEW OF SYSTEMS:   Constitutional: ( - ) fevers, ( - )  chills , ( - ) night sweats Eyes: ( - )  blurriness of vision, ( - ) double vision, ( - ) watery eyes Ears, nose, mouth, throat, and face: ( - ) mucositis, ( - ) sore throat Respiratory: ( - ) cough, ( - ) dyspnea, ( - ) wheezes Cardiovascular: ( - ) palpitation, ( - ) chest discomfort, ( + ) lower extremity swelling Gastrointestinal:  (+ ) nausea, ( - ) heartburn, ( - ) change in bowel habits Skin: ( - ) abnormal skin rashes Lymphatics: ( - ) new lymphadenopathy, ( - ) easy bruising Neurological: ( - ) numbness, ( - ) tingling, ( - ) new weaknesses Behavioral/Psych: ( - ) mood change, ( - ) new changes  All other systems were reviewed with the patient and are negative.  PHYSICAL EXAMINATION: ECOG PERFORMANCE STATUS: 2 - Symptomatic, <50% confined to bed  There were no vitals filed for this visit. There were no vitals filed for this visit.   Day 1, Cycle 4 02/25/24  Weight 147 lb 0.8 oz (66.7 kg)   Temp 98.6 F (37 C)  Temp src Oral  Pulse 85  Resp 18  BP 94/52 !    GENERAL: well appearing female in NAD  SKIN: skin color, texture, turgor are normal, no rashes or significant lesions EYES: conjunctiva are pink and non-injected, sclera clear LUNGS: clear to auscultation and percussion with normal breathing effort HEART: regular rate & rhythm and no murmurs. Bilateral  lower extremity edema Musculoskeletal: no cyanosis of digits and no clubbing  PSYCH: alert & oriented x 3, fluent speech NEURO: no focal motor/sensory deficits  LABORATORY DATA:  I have reviewed the data as listed    Latest Ref Rng & Units 02/25/2024    1:25 PM 02/05/2024    2:10 PM 01/29/2024   11:38 AM  CBC  WBC 4.0 - 10.5 K/uL 8.7  12.4  7.2   Hemoglobin 12.0 - 15.0 g/dL 8.3  9.0  9.3   Hematocrit 36.0 - 46.0 % 25.7  27.5  28.0   Platelets 150 - 400 K/uL 271  242  359        Latest Ref Rng & Units 02/25/2024    1:25 PM 02/05/2024    2:10 PM 01/29/2024   11:38 AM  CMP  Glucose 70 - 99 mg/dL 85  892  893   BUN 8 - 23 mg/dL 28  22  18    Creatinine 0.44 - 1.00 mg/dL 8.49  8.80  8.72   Sodium 135 - 145 mmol/L 137  139  138   Potassium 3.5 - 5.1 mmol/L 4.0  4.3  3.4   Chloride 98 - 111 mmol/L 105  109  108   CO2 22 - 32 mmol/L 26  20  24    Calcium  8.9 - 10.3 mg/dL 9.0  9.1  9.0   Total Protein 6.5 - 8.1 g/dL 6.0  6.2  6.4   Total Bilirubin 0.0 - 1.2 mg/dL 0.4  0.6  0.4   Alkaline Phos 38 - 126 U/L 74  92  90   AST 15 - 41 U/L 15  19  19    ALT 0 - 44 U/L 7  8  7       RADIOGRAPHIC STUDIES: I have personally reviewed the radiological images as listed and agreed with the findings in the report. NM PET Image Restag (PS) Skull Base To Thigh Result Date: 02/12/2024 CLINICAL DATA:  Subsequent treatment strategy for bladder carcinoma. EXAM: NUCLEAR MEDICINE PET SKULL BASE TO THIGH TECHNIQUE: 7.6 mCi F-18  FDG was injected intravenously. Full-ring PET imaging was performed from the skull base to thigh after the  radiotracer. CT data was obtained and used for attenuation correction and anatomic localization. Fasting blood glucose: 89 mg/dl COMPARISON:  95/81/7974 FINDINGS: Mediastinal blood pool activity: SUV max 2.2 Liver activity: SUV max NA NECK: No areas of abnormal hypermetabolism. Incidental CT findings: No cervical adenopathy. CHEST: No pulmonary parenchymal or thoracic nodal hypermetabolism. Incidental CT findings: Pacer. Cardiomegaly. Mild right hemidiaphragm elevation. ABDOMEN/PELVIS: No abdominopelvic nodal hypermetabolism. Pelvic evaluation again degraded by right hip arthroplasty. The right pelvic soft tissue density is slightly less distinct today, with decreased hypermetabolism. Example at a S.U.V. max of 5.4 on 143/4. Compare a S.U.V. max of 14.9 on the prior. Incidental CT findings: Right ureteric stent placed in the interval. Right nephrostomy remains in place. Similar mild right hydronephrosis. The left-sided hydronephrosis has resolved. Bilateral low-density renal lesions are likely cysts at up to 1 cm . In the absence of clinically indicated signs/symptoms require(s) no independent follow-up. Normal adrenal glands. Cholecystectomy with borderline common duct dilatation, similar at 1.0 cm. SKELETON: Improvement in osseous metastasis. Index left parasymphyseal pubic bone lesion demonstrates increased sclerosis and measures a S.U.V. max of 2.3 on 150/4. Compare a S.U.V. max of 9.6 on the prior. Incidental CT findings: Sacral insufficiency fractures. Cervical spine fixation. Rib deformities with hypermetabolism primarily felt to be posttraumatic. IMPRESSION: 1. Response to therapy of osseous metastasis. 2. The pelvis is again poorly evaluated. Soft tissue density within the deep pelvis is less distinct today with decreased hypermetabolism. Potential etiologies again include response to therapy of a synchronous uterine primary versus isolated metastasis (presuming the patient is status post hysterectomy). 3.  Placement of a right ureteric stent with similar mild right-sided hydronephrosis. The left-sided hydroureteronephrosis has resolved. 4.  Aortic Atherosclerosis (ICD10-I70.0). Electronically Signed   By: Rockey Kilts M.D.   On: 02/12/2024 16:20    ASSESSMENT & PLAN [name] is a 68 y.o. female who presents to the clinic for evaluation of metastatic urothelial carcinoma.   #Metastatic urothelial carcinoma involving the bone: --Initially presented with back pain radiating down to the hip in March of 2025 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 Cystoscopy with TURBT show right sided bladder tumor with palpable anterior vaginal mass. 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  --Started Cycle 1, Day 1 of Enfortumumab plus Pembro on 12/18/2023 --Most recent PET/CT can from 02/12/2024 showed interval response to therapy after C3 PLAN --Due for Cycle 4, Day 1 today --Labs from today were reviewed and adequate for treatment. WBC 8.7, Hgb 8.3, Plt 271, Creatinine overall stable at 1.50, LFTs normal --Proceed with treatment today without dose modifications --Will hold Cycle 4, Day 8 next week to help improve her strength and minimize worsening cytopenias --RTC in 3 weeks with labs and follow up prior to Cycle 5, Day 1.  #Anemia: --Secondary to enfortumab and renal dysfunction. History of folate and B12 deficiency but most recent labs from 02/05/2024 show no deficiency.  --Will request patient to return in 1 week with labs and slot for blood transfusion as needed  #Lower extremity edema: --Continue to take lasix  20 mg PO daily --Encouraged to wear compression stockings and elevate legs    Orders Placed This Encounter  Procedures   CBC with Differential (Cancer Center Only)    Standing Status:   Future    Expected Date:   03/03/2024    Expiration Date:  06/01/2024   Sample to Blood Bank    Standing Status:   Future     Expected Date:   03/03/2024    Expiration Date:   06/01/2024    All questions were answered. The patient knows to call the clinic with any problems, questions or concerns.  I have spent a total of 30 minutes minutes of face-to-face and non-face-to-face time, preparing to see the patient,  performing a medically appropriate examination, counseling and educating the patient, ordering medications/tests/procedures,  documenting clinical information in the electronic health record, independently interpreting results and communicating results to the patient, and care coordination.   Johnston Police, PA-C Department of Hematology/Oncology [facility] Phone: [phone]

## 2024-03-03 ENCOUNTER — Ambulatory Visit: Payer: 59

## 2024-03-03 DIAGNOSIS — N1832 Chronic kidney disease, stage 3b: Secondary | ICD-10-CM | POA: Diagnosis not present

## 2024-03-03 DIAGNOSIS — D520 Dietary folate deficiency anemia: Secondary | ICD-10-CM | POA: Diagnosis not present

## 2024-03-03 DIAGNOSIS — E440 Moderate protein-calorie malnutrition: Secondary | ICD-10-CM | POA: Diagnosis not present

## 2024-03-03 DIAGNOSIS — I420 Dilated cardiomyopathy: Secondary | ICD-10-CM | POA: Diagnosis not present

## 2024-03-03 DIAGNOSIS — I2510 Atherosclerotic heart disease of native coronary artery without angina pectoris: Secondary | ICD-10-CM | POA: Diagnosis not present

## 2024-03-03 DIAGNOSIS — N1330 Unspecified hydronephrosis: Secondary | ICD-10-CM | POA: Diagnosis not present

## 2024-03-03 DIAGNOSIS — L89312 Pressure ulcer of right buttock, stage 2: Secondary | ICD-10-CM | POA: Diagnosis not present

## 2024-03-03 DIAGNOSIS — M47814 Spondylosis without myelopathy or radiculopathy, thoracic region: Secondary | ICD-10-CM | POA: Diagnosis not present

## 2024-03-03 DIAGNOSIS — E785 Hyperlipidemia, unspecified: Secondary | ICD-10-CM | POA: Diagnosis not present

## 2024-03-03 DIAGNOSIS — M8008XD Age-related osteoporosis with current pathological fracture, vertebra(e), subsequent encounter for fracture with routine healing: Secondary | ICD-10-CM | POA: Diagnosis not present

## 2024-03-03 DIAGNOSIS — D631 Anemia in chronic kidney disease: Secondary | ICD-10-CM | POA: Diagnosis not present

## 2024-03-03 DIAGNOSIS — I5042 Chronic combined systolic (congestive) and diastolic (congestive) heart failure: Secondary | ICD-10-CM | POA: Diagnosis not present

## 2024-03-03 DIAGNOSIS — Z936 Other artificial openings of urinary tract status: Secondary | ICD-10-CM | POA: Diagnosis not present

## 2024-03-03 DIAGNOSIS — J9611 Chronic respiratory failure with hypoxia: Secondary | ICD-10-CM | POA: Diagnosis not present

## 2024-03-03 DIAGNOSIS — M4726 Other spondylosis with radiculopathy, lumbar region: Secondary | ICD-10-CM | POA: Diagnosis not present

## 2024-03-03 DIAGNOSIS — F5101 Primary insomnia: Secondary | ICD-10-CM | POA: Diagnosis not present

## 2024-03-03 DIAGNOSIS — G8220 Paraplegia, unspecified: Secondary | ICD-10-CM | POA: Diagnosis not present

## 2024-03-03 DIAGNOSIS — D630 Anemia in neoplastic disease: Secondary | ICD-10-CM | POA: Diagnosis not present

## 2024-03-03 DIAGNOSIS — M899 Disorder of bone, unspecified: Secondary | ICD-10-CM | POA: Diagnosis not present

## 2024-03-03 DIAGNOSIS — J4489 Other specified chronic obstructive pulmonary disease: Secondary | ICD-10-CM | POA: Diagnosis not present

## 2024-03-03 DIAGNOSIS — C679 Malignant neoplasm of bladder, unspecified: Secondary | ICD-10-CM | POA: Diagnosis not present

## 2024-03-03 DIAGNOSIS — M069 Rheumatoid arthritis, unspecified: Secondary | ICD-10-CM | POA: Diagnosis not present

## 2024-03-04 ENCOUNTER — Other Ambulatory Visit: Payer: Self-pay

## 2024-03-04 DIAGNOSIS — M899 Disorder of bone, unspecified: Secondary | ICD-10-CM | POA: Diagnosis not present

## 2024-03-04 DIAGNOSIS — C679 Malignant neoplasm of bladder, unspecified: Secondary | ICD-10-CM | POA: Diagnosis not present

## 2024-03-04 DIAGNOSIS — D630 Anemia in neoplastic disease: Secondary | ICD-10-CM | POA: Diagnosis not present

## 2024-03-04 DIAGNOSIS — F5101 Primary insomnia: Secondary | ICD-10-CM | POA: Diagnosis not present

## 2024-03-04 DIAGNOSIS — M069 Rheumatoid arthritis, unspecified: Secondary | ICD-10-CM | POA: Diagnosis not present

## 2024-03-04 DIAGNOSIS — D631 Anemia in chronic kidney disease: Secondary | ICD-10-CM | POA: Diagnosis not present

## 2024-03-04 DIAGNOSIS — J4489 Other specified chronic obstructive pulmonary disease: Secondary | ICD-10-CM | POA: Diagnosis not present

## 2024-03-04 DIAGNOSIS — R630 Anorexia: Secondary | ICD-10-CM

## 2024-03-04 DIAGNOSIS — M4726 Other spondylosis with radiculopathy, lumbar region: Secondary | ICD-10-CM | POA: Diagnosis not present

## 2024-03-04 DIAGNOSIS — M47814 Spondylosis without myelopathy or radiculopathy, thoracic region: Secondary | ICD-10-CM | POA: Diagnosis not present

## 2024-03-04 DIAGNOSIS — L89312 Pressure ulcer of right buttock, stage 2: Secondary | ICD-10-CM | POA: Diagnosis not present

## 2024-03-04 DIAGNOSIS — Z936 Other artificial openings of urinary tract status: Secondary | ICD-10-CM | POA: Diagnosis not present

## 2024-03-04 DIAGNOSIS — M8008XD Age-related osteoporosis with current pathological fracture, vertebra(e), subsequent encounter for fracture with routine healing: Secondary | ICD-10-CM | POA: Diagnosis not present

## 2024-03-04 DIAGNOSIS — N1330 Unspecified hydronephrosis: Secondary | ICD-10-CM | POA: Diagnosis not present

## 2024-03-04 DIAGNOSIS — I420 Dilated cardiomyopathy: Secondary | ICD-10-CM | POA: Diagnosis not present

## 2024-03-04 DIAGNOSIS — D520 Dietary folate deficiency anemia: Secondary | ICD-10-CM | POA: Diagnosis not present

## 2024-03-04 DIAGNOSIS — N1832 Chronic kidney disease, stage 3b: Secondary | ICD-10-CM | POA: Diagnosis not present

## 2024-03-04 DIAGNOSIS — G8220 Paraplegia, unspecified: Secondary | ICD-10-CM | POA: Diagnosis not present

## 2024-03-04 DIAGNOSIS — E785 Hyperlipidemia, unspecified: Secondary | ICD-10-CM | POA: Diagnosis not present

## 2024-03-04 DIAGNOSIS — J9611 Chronic respiratory failure with hypoxia: Secondary | ICD-10-CM | POA: Diagnosis not present

## 2024-03-04 DIAGNOSIS — I2510 Atherosclerotic heart disease of native coronary artery without angina pectoris: Secondary | ICD-10-CM | POA: Diagnosis not present

## 2024-03-04 DIAGNOSIS — I5042 Chronic combined systolic (congestive) and diastolic (congestive) heart failure: Secondary | ICD-10-CM | POA: Diagnosis not present

## 2024-03-04 DIAGNOSIS — E440 Moderate protein-calorie malnutrition: Secondary | ICD-10-CM | POA: Diagnosis not present

## 2024-03-04 LAB — CUP PACEART REMOTE DEVICE CHECK
Battery Remaining Longevity: 77 mo
Battery Voltage: 2.99 V
Brady Statistic AP VP Percent: 0 %
Brady Statistic AP VS Percent: 0.17 %
Brady Statistic AS VP Percent: 0.03 %
Brady Statistic AS VS Percent: 99.8 %
Brady Statistic RA Percent Paced: 0.18 %
Brady Statistic RV Percent Paced: 0.03 %
Date Time Interrogation Session: 20250709012405
HighPow Impedance: 57 Ohm
Implantable Lead Connection Status: 753985
Implantable Lead Connection Status: 753985
Implantable Lead Implant Date: 20120502
Implantable Lead Implant Date: 20120502
Implantable Lead Location: 753859
Implantable Lead Location: 753860
Implantable Lead Model: 4076
Implantable Lead Model: 7122
Implantable Pulse Generator Implant Date: 20200925
Lead Channel Impedance Value: 342 Ohm
Lead Channel Impedance Value: 703 Ohm
Lead Channel Impedance Value: 817 Ohm
Lead Channel Pacing Threshold Amplitude: 0.625 V
Lead Channel Pacing Threshold Amplitude: 1.125 V
Lead Channel Pacing Threshold Pulse Width: 0.4 ms
Lead Channel Pacing Threshold Pulse Width: 0.4 ms
Lead Channel Sensing Intrinsic Amplitude: 10.125 mV
Lead Channel Sensing Intrinsic Amplitude: 10.125 mV
Lead Channel Sensing Intrinsic Amplitude: 2.75 mV
Lead Channel Sensing Intrinsic Amplitude: 2.75 mV
Lead Channel Setting Pacing Amplitude: 1.5 V
Lead Channel Setting Pacing Amplitude: 2.25 V
Lead Channel Setting Pacing Pulse Width: 0.4 ms
Lead Channel Setting Sensing Sensitivity: 0.3 mV
Zone Setting Status: 755011
Zone Setting Status: 755011

## 2024-03-05 ENCOUNTER — Inpatient Hospital Stay

## 2024-03-05 ENCOUNTER — Other Ambulatory Visit: Payer: Self-pay | Admitting: *Deleted

## 2024-03-05 DIAGNOSIS — Z87891 Personal history of nicotine dependence: Secondary | ICD-10-CM | POA: Diagnosis not present

## 2024-03-05 DIAGNOSIS — Z515 Encounter for palliative care: Secondary | ICD-10-CM | POA: Diagnosis not present

## 2024-03-05 DIAGNOSIS — M8458XA Pathological fracture in neoplastic disease, other specified site, initial encounter for fracture: Secondary | ICD-10-CM

## 2024-03-05 DIAGNOSIS — C679 Malignant neoplasm of bladder, unspecified: Secondary | ICD-10-CM

## 2024-03-05 DIAGNOSIS — Z79899 Other long term (current) drug therapy: Secondary | ICD-10-CM | POA: Diagnosis not present

## 2024-03-05 DIAGNOSIS — Z79620 Long term (current) use of immunosuppressive biologic: Secondary | ICD-10-CM | POA: Diagnosis not present

## 2024-03-05 DIAGNOSIS — R600 Localized edema: Secondary | ICD-10-CM | POA: Diagnosis not present

## 2024-03-05 DIAGNOSIS — G893 Neoplasm related pain (acute) (chronic): Secondary | ICD-10-CM | POA: Diagnosis not present

## 2024-03-05 DIAGNOSIS — Z5112 Encounter for antineoplastic immunotherapy: Secondary | ICD-10-CM | POA: Diagnosis not present

## 2024-03-05 DIAGNOSIS — D6481 Anemia due to antineoplastic chemotherapy: Secondary | ICD-10-CM | POA: Diagnosis not present

## 2024-03-05 DIAGNOSIS — Z79891 Long term (current) use of opiate analgesic: Secondary | ICD-10-CM | POA: Diagnosis not present

## 2024-03-05 DIAGNOSIS — R110 Nausea: Secondary | ICD-10-CM | POA: Diagnosis not present

## 2024-03-05 DIAGNOSIS — R197 Diarrhea, unspecified: Secondary | ICD-10-CM | POA: Diagnosis not present

## 2024-03-05 DIAGNOSIS — I2510 Atherosclerotic heart disease of native coronary artery without angina pectoris: Secondary | ICD-10-CM | POA: Diagnosis not present

## 2024-03-05 DIAGNOSIS — D631 Anemia in chronic kidney disease: Secondary | ICD-10-CM | POA: Diagnosis not present

## 2024-03-05 DIAGNOSIS — I5022 Chronic systolic (congestive) heart failure: Secondary | ICD-10-CM | POA: Diagnosis not present

## 2024-03-05 DIAGNOSIS — N1831 Chronic kidney disease, stage 3a: Secondary | ICD-10-CM | POA: Diagnosis not present

## 2024-03-05 DIAGNOSIS — C7951 Secondary malignant neoplasm of bone: Secondary | ICD-10-CM | POA: Diagnosis not present

## 2024-03-05 LAB — CBC WITH DIFFERENTIAL (CANCER CENTER ONLY)
Abs Immature Granulocytes: 0.1 K/uL — ABNORMAL HIGH (ref 0.00–0.07)
Basophils Absolute: 0.1 K/uL (ref 0.0–0.1)
Basophils Relative: 1 %
Eosinophils Absolute: 0.8 K/uL — ABNORMAL HIGH (ref 0.0–0.5)
Eosinophils Relative: 7 %
HCT: 25.4 % — ABNORMAL LOW (ref 36.0–46.0)
Hemoglobin: 8.4 g/dL — ABNORMAL LOW (ref 12.0–15.0)
Immature Granulocytes: 1 %
Lymphocytes Relative: 9 %
Lymphs Abs: 1.1 K/uL (ref 0.7–4.0)
MCH: 30.7 pg (ref 26.0–34.0)
MCHC: 33.1 g/dL (ref 30.0–36.0)
MCV: 92.7 fL (ref 80.0–100.0)
Monocytes Absolute: 1 K/uL (ref 0.1–1.0)
Monocytes Relative: 9 %
Neutro Abs: 8.4 K/uL — ABNORMAL HIGH (ref 1.7–7.7)
Neutrophils Relative %: 73 %
Platelet Count: 292 K/uL (ref 150–400)
RBC: 2.74 MIL/uL — ABNORMAL LOW (ref 3.87–5.11)
RDW: 16 % — ABNORMAL HIGH (ref 11.5–15.5)
WBC Count: 11.4 K/uL — ABNORMAL HIGH (ref 4.0–10.5)
nRBC: 0 % (ref 0.0–0.2)

## 2024-03-05 LAB — SAMPLE TO BLOOD BANK

## 2024-03-05 LAB — ABO/RH: ABO/RH(D): O NEG

## 2024-03-05 MED ORDER — SODIUM CHLORIDE 0.9% FLUSH
10.0000 mL | Freq: Once | INTRAVENOUS | Status: AC
  Administered 2024-03-05: 10 mL

## 2024-03-05 NOTE — Patient Instructions (Signed)

## 2024-03-05 NOTE — Progress Notes (Signed)
 Pt arrived to infusion for first time blood administration.No parameters in chart for blood administration. Dr. Tina on PAL. Pod partner on PAL. APP of pod partner contacted. No orders given. Provider on call unavailable. Dr. Tina called and unavailable. Nursing director contacted and this RN told to send patient home.

## 2024-03-08 ENCOUNTER — Ambulatory Visit: Payer: Self-pay | Admitting: Physician Assistant

## 2024-03-09 ENCOUNTER — Other Ambulatory Visit: Payer: Self-pay | Admitting: Nurse Practitioner

## 2024-03-09 DIAGNOSIS — R197 Diarrhea, unspecified: Secondary | ICD-10-CM

## 2024-03-09 DIAGNOSIS — Z515 Encounter for palliative care: Secondary | ICD-10-CM

## 2024-03-09 DIAGNOSIS — C679 Malignant neoplasm of bladder, unspecified: Secondary | ICD-10-CM

## 2024-03-09 DIAGNOSIS — G893 Neoplasm related pain (acute) (chronic): Secondary | ICD-10-CM

## 2024-03-09 MED ORDER — OXYCODONE HCL 20 MG PO TABS: 20.0000 mg | ORAL_TABLET | ORAL | 0 refills | Status: DC | PRN

## 2024-03-09 MED ORDER — DIPHENOXYLATE-ATROPINE 2.5-0.025 MG PO TABS: 1.0000 | ORAL_TABLET | Freq: Four times a day (QID) | ORAL | 0 refills | Status: DC | PRN

## 2024-03-10 ENCOUNTER — Other Ambulatory Visit: Payer: Self-pay | Admitting: Nurse Practitioner

## 2024-03-10 ENCOUNTER — Ambulatory Visit: Payer: Self-pay | Admitting: Cardiology

## 2024-03-10 DIAGNOSIS — C679 Malignant neoplasm of bladder, unspecified: Secondary | ICD-10-CM

## 2024-03-10 MED ORDER — PROCHLORPERAZINE MALEATE 10 MG PO TABS: 10.0000 mg | ORAL_TABLET | Freq: Four times a day (QID) | ORAL | 3 refills | Status: DC | PRN

## 2024-03-11 DIAGNOSIS — I2510 Atherosclerotic heart disease of native coronary artery without angina pectoris: Secondary | ICD-10-CM | POA: Diagnosis not present

## 2024-03-11 DIAGNOSIS — C679 Malignant neoplasm of bladder, unspecified: Secondary | ICD-10-CM | POA: Diagnosis not present

## 2024-03-11 DIAGNOSIS — N1832 Chronic kidney disease, stage 3b: Secondary | ICD-10-CM | POA: Diagnosis not present

## 2024-03-11 DIAGNOSIS — M4726 Other spondylosis with radiculopathy, lumbar region: Secondary | ICD-10-CM | POA: Diagnosis not present

## 2024-03-11 DIAGNOSIS — F5101 Primary insomnia: Secondary | ICD-10-CM | POA: Diagnosis not present

## 2024-03-11 DIAGNOSIS — D630 Anemia in neoplastic disease: Secondary | ICD-10-CM | POA: Diagnosis not present

## 2024-03-11 DIAGNOSIS — D631 Anemia in chronic kidney disease: Secondary | ICD-10-CM | POA: Diagnosis not present

## 2024-03-11 DIAGNOSIS — J4489 Other specified chronic obstructive pulmonary disease: Secondary | ICD-10-CM | POA: Diagnosis not present

## 2024-03-11 DIAGNOSIS — N1330 Unspecified hydronephrosis: Secondary | ICD-10-CM | POA: Diagnosis not present

## 2024-03-11 DIAGNOSIS — G8220 Paraplegia, unspecified: Secondary | ICD-10-CM | POA: Diagnosis not present

## 2024-03-11 DIAGNOSIS — D520 Dietary folate deficiency anemia: Secondary | ICD-10-CM | POA: Diagnosis not present

## 2024-03-11 DIAGNOSIS — M069 Rheumatoid arthritis, unspecified: Secondary | ICD-10-CM | POA: Diagnosis not present

## 2024-03-11 DIAGNOSIS — I420 Dilated cardiomyopathy: Secondary | ICD-10-CM | POA: Diagnosis not present

## 2024-03-11 DIAGNOSIS — J449 Chronic obstructive pulmonary disease, unspecified: Secondary | ICD-10-CM | POA: Diagnosis not present

## 2024-03-11 DIAGNOSIS — E785 Hyperlipidemia, unspecified: Secondary | ICD-10-CM | POA: Diagnosis not present

## 2024-03-11 DIAGNOSIS — J9611 Chronic respiratory failure with hypoxia: Secondary | ICD-10-CM | POA: Diagnosis not present

## 2024-03-11 DIAGNOSIS — M899 Disorder of bone, unspecified: Secondary | ICD-10-CM | POA: Diagnosis not present

## 2024-03-11 DIAGNOSIS — Z936 Other artificial openings of urinary tract status: Secondary | ICD-10-CM | POA: Diagnosis not present

## 2024-03-11 DIAGNOSIS — E440 Moderate protein-calorie malnutrition: Secondary | ICD-10-CM | POA: Diagnosis not present

## 2024-03-11 DIAGNOSIS — M47814 Spondylosis without myelopathy or radiculopathy, thoracic region: Secondary | ICD-10-CM | POA: Diagnosis not present

## 2024-03-11 DIAGNOSIS — M8008XD Age-related osteoporosis with current pathological fracture, vertebra(e), subsequent encounter for fracture with routine healing: Secondary | ICD-10-CM | POA: Diagnosis not present

## 2024-03-11 DIAGNOSIS — I5042 Chronic combined systolic (congestive) and diastolic (congestive) heart failure: Secondary | ICD-10-CM | POA: Diagnosis not present

## 2024-03-11 DIAGNOSIS — L89312 Pressure ulcer of right buttock, stage 2: Secondary | ICD-10-CM | POA: Diagnosis not present

## 2024-03-12 DIAGNOSIS — I2510 Atherosclerotic heart disease of native coronary artery without angina pectoris: Secondary | ICD-10-CM | POA: Diagnosis not present

## 2024-03-12 DIAGNOSIS — I5022 Chronic systolic (congestive) heart failure: Secondary | ICD-10-CM | POA: Diagnosis not present

## 2024-03-12 DIAGNOSIS — J9611 Chronic respiratory failure with hypoxia: Secondary | ICD-10-CM | POA: Diagnosis not present

## 2024-03-15 DIAGNOSIS — Z515 Encounter for palliative care: Secondary | ICD-10-CM | POA: Diagnosis not present

## 2024-03-15 MED ORDER — DRONABINOL 5 MG PO CAPS: 5.0000 mg | ORAL_CAPSULE | Freq: Two times a day (BID) | ORAL | 0 refills | Status: DC

## 2024-03-15 NOTE — Addendum Note (Signed)
 Addended by: PICKENPACK-COUSAR, Maryelizabeth Eberle N on: 03/15/2024 10:06 AM   Modules accepted: Orders

## 2024-03-16 ENCOUNTER — Other Ambulatory Visit: Payer: Self-pay | Admitting: Physician Assistant

## 2024-03-17 DIAGNOSIS — F5101 Primary insomnia: Secondary | ICD-10-CM | POA: Diagnosis not present

## 2024-03-17 DIAGNOSIS — E785 Hyperlipidemia, unspecified: Secondary | ICD-10-CM | POA: Diagnosis not present

## 2024-03-17 DIAGNOSIS — D520 Dietary folate deficiency anemia: Secondary | ICD-10-CM | POA: Diagnosis not present

## 2024-03-17 DIAGNOSIS — I5042 Chronic combined systolic (congestive) and diastolic (congestive) heart failure: Secondary | ICD-10-CM | POA: Diagnosis not present

## 2024-03-17 DIAGNOSIS — I2510 Atherosclerotic heart disease of native coronary artery without angina pectoris: Secondary | ICD-10-CM | POA: Diagnosis not present

## 2024-03-17 DIAGNOSIS — J4489 Other specified chronic obstructive pulmonary disease: Secondary | ICD-10-CM | POA: Diagnosis not present

## 2024-03-17 DIAGNOSIS — E440 Moderate protein-calorie malnutrition: Secondary | ICD-10-CM | POA: Diagnosis not present

## 2024-03-17 DIAGNOSIS — N1832 Chronic kidney disease, stage 3b: Secondary | ICD-10-CM | POA: Diagnosis not present

## 2024-03-17 DIAGNOSIS — M069 Rheumatoid arthritis, unspecified: Secondary | ICD-10-CM | POA: Diagnosis not present

## 2024-03-17 DIAGNOSIS — M8008XD Age-related osteoporosis with current pathological fracture, vertebra(e), subsequent encounter for fracture with routine healing: Secondary | ICD-10-CM | POA: Diagnosis not present

## 2024-03-17 DIAGNOSIS — Z936 Other artificial openings of urinary tract status: Secondary | ICD-10-CM | POA: Diagnosis not present

## 2024-03-17 DIAGNOSIS — M4726 Other spondylosis with radiculopathy, lumbar region: Secondary | ICD-10-CM | POA: Diagnosis not present

## 2024-03-17 DIAGNOSIS — N1330 Unspecified hydronephrosis: Secondary | ICD-10-CM | POA: Diagnosis not present

## 2024-03-17 DIAGNOSIS — J9611 Chronic respiratory failure with hypoxia: Secondary | ICD-10-CM | POA: Diagnosis not present

## 2024-03-17 DIAGNOSIS — L89312 Pressure ulcer of right buttock, stage 2: Secondary | ICD-10-CM | POA: Diagnosis not present

## 2024-03-17 DIAGNOSIS — D630 Anemia in neoplastic disease: Secondary | ICD-10-CM | POA: Diagnosis not present

## 2024-03-17 DIAGNOSIS — C679 Malignant neoplasm of bladder, unspecified: Secondary | ICD-10-CM | POA: Diagnosis not present

## 2024-03-17 DIAGNOSIS — M47814 Spondylosis without myelopathy or radiculopathy, thoracic region: Secondary | ICD-10-CM | POA: Diagnosis not present

## 2024-03-17 DIAGNOSIS — M899 Disorder of bone, unspecified: Secondary | ICD-10-CM | POA: Diagnosis not present

## 2024-03-17 DIAGNOSIS — I420 Dilated cardiomyopathy: Secondary | ICD-10-CM | POA: Diagnosis not present

## 2024-03-17 DIAGNOSIS — D631 Anemia in chronic kidney disease: Secondary | ICD-10-CM | POA: Diagnosis not present

## 2024-03-17 DIAGNOSIS — G8220 Paraplegia, unspecified: Secondary | ICD-10-CM | POA: Diagnosis not present

## 2024-03-18 ENCOUNTER — Encounter: Payer: Self-pay | Admitting: Nurse Practitioner

## 2024-03-18 ENCOUNTER — Inpatient Hospital Stay

## 2024-03-18 DIAGNOSIS — G893 Neoplasm related pain (acute) (chronic): Secondary | ICD-10-CM

## 2024-03-18 DIAGNOSIS — Z515 Encounter for palliative care: Secondary | ICD-10-CM

## 2024-03-18 DIAGNOSIS — R530 Neoplastic (malignant) related fatigue: Secondary | ICD-10-CM | POA: Diagnosis not present

## 2024-03-18 DIAGNOSIS — M62838 Other muscle spasm: Secondary | ICD-10-CM

## 2024-03-18 DIAGNOSIS — C679 Malignant neoplasm of bladder, unspecified: Secondary | ICD-10-CM

## 2024-03-18 MED ORDER — OXYCODONE HCL 20 MG PO TABS: 20.0000 mg | ORAL_TABLET | ORAL | 0 refills | Status: DC | PRN

## 2024-03-18 NOTE — Progress Notes (Signed)
 Palliative Medicine [facility]  [phone] [phone]   Name: [name] Date: 03/18/2024 MRN: [id number]  DOB: 1956-06-10  Patient Care Team: Teressa Harrie HERO, FNP as PCP - General (Family Medicine) Inocencio Soyla Lunger, MD as PCP - Electrophysiology (Cardiology) Bernie Lamar PARAS, MD as PCP - Cardiology (Cardiology) Mavis Purchase, MD as Consulting Physician (Neurosurgery) Bernie Lamar PARAS, MD as Consulting Physician (Cardiology) Sheldon Standing, MD as Consulting Physician (General Surgery) Skeet Juliene SAUNDERS, DO as Consulting Physician (Neurology) Delores Lauraine NOVAK, [facility] (Inactive) as Pharmacist (Pharmacist) Saintclair Jasper, MD as Consulting Physician (Gastroenterology) Zehr, Harlene BIRCH, PA-C as Physician Assistant (Gastroenterology) Dennise Hoes, MD as Consulting Physician (Nephrology) Pickenpack-Cousar, Fannie SAILOR, NP as Nurse Practitioner [facility] and Palliative Medicine)   I connected with LARAINA SULTON on 03/18/24 at  3:30 PM EDT by telephone and verified that I am speaking with the correct person using two identifiers.   I discussed the limitations, risks, security and privacy concerns of performing an evaluation and management service by telemedicine and the availability of in-person appointments. I also discussed with the patient that there may be a patient responsible charge related to this service. The patient expressed understanding and agreed to proceed.   Other persons participating in the visit and their role in the encounter: [name] (daughter)   Patient's location: Home  Provider's location: [facility]   INTERVAL HISTORY: NA [name] is a 68 y.o. female with oncologic medical history including metastatic bladder cancer with T5 bone metastasis, COPD, chronic back pain from DDD, osteoporosis with fractures, chronic pain management at [facility].  Palliative is seeing patient for symptom management and goals of care.   SOCIAL  HISTORY:     reports that she quit smoking about 3 years ago. Her smoking use included cigarettes. She started smoking about 34 years ago. She has a 15 pack-year smoking history. She has never used smokeless tobacco. She reports current alcohol use of about 2.0 standard drinks of alcohol per week. She reports that she does not use drugs.  ADVANCE DIRECTIVES:  None on file   CODE STATUS: Full code  PAST MEDICAL HISTORY: Past Medical History:  Diagnosis Date   Abdominal pain 10/17/2020   Abnormality of gait due to impairment of balance 11/22/2020   Acute diverticulitis 10/17/2020   Admission for long-term opiate analgesic use 10/24/2019   Arthritis of right hip 05/29/2016   Formatting of this note might be different from the original. Added automatically from request for surgery [id number]   BMI 26.0-26.9,adult 03/29/2020   Bronchial asthma    Cardiomyopathy (HCC)    Overview:  Ejection fraction 45% in 2015 Ejection fraction 30 to 35% in November 2018   Chronic back pain    Chronic constipation 07/31/2021   Chronic hypoxemic respiratory failure (HCC) 10/24/2019   Chronic narcotic use 03/23/2015   Chronic pain of both knees 12/21/2018   Added automatically from request for surgery [id number]  Formatting of this note might be different from the original. 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 06/19/2017   Dyslipidemia 05/31/2015   GERD (gastroesophageal reflux disease)    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HCC) 07/13/2020   Hypotension 05/17/2020   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   Ileus following gastrointestinal surgery (HCC) 03/26/2015   Major depressive disorder, single episode, moderate (HCC) 10/24/2019   Malnutrition of moderate degree (HCC) 10/20/2020   Mesenteric ischemia (HCC) 10/16/2020   Migraine without aura with status migrainosus 10/24/2019   Mixed incontinence 10/20/2020   Myocardial infarction (HCC)    Nausea & vomiting    Obstructive chronic bronchitis with exacerbation (HCC) 10/25/2019   Other spondylosis with radiculopathy, lumbar region 10/24/2019   Persistent vomiting 05/17/2020   Presence of left artificial hip joint 10/24/2019   PTSD (post-traumatic stress disorder)    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 03/23/2015 04/19/2014   Senile osteoporosis 10/24/2019   Sepsis (HCC) 10/17/2020   Serosanguineous chronic otitis media of right ear 08/02/2021   Small bowel obstruction (HCC)    Thyroid  disease    Upper respiratory tract infection due to COVID-19 virus 07/06/2021   Wellness examination 03/27/2021    ALLERGIES:  is allergic to lyrica  [pregabalin ].  MEDICATIONS:  Current Outpatient Medications  Medication Sig Dispense Refill   ARIPiprazole  (ABILIFY ) 5 MG tablet TAKE 1 TABLET BY MOUTH DAILY 30 tablet 10   aspirin  81 MG tablet Take 81 mg by mouth daily.     BREO ELLIPTA  100-25 MCG/ACT AEPB INHALE 1 PUFF INTO THE LUNGS DAILY. (Patient taking differently: Inhale 1 puff into the lungs daily as needed (SOB).) 60 each 10   Carboxymethylcell-Glycerin  PF 0.5-0.9 % SOLN Place 2 drops into both eyes 4 (four) times daily. 1 each 11   carvedilol  (COREG ) 3.125 MG tablet TAKE ONE (1) TABLET BY MOUTH TWICE DAILY WITH MEALS (Patient taking differently: Take 3.125 mg by mouth 2  (two) times daily with a meal.) 60 tablet 10   denosumab  (PROLIA ) 60 MG/ML SOSY injection Inject 60 mg into the skin every 6 (six) months. 180 mL 2   dextromethorphan  15 MG/5ML syrup Take 10 mLs (30 mg total) by mouth 4 (four) times daily as needed for cough. 120 mL 0   diclofenac  Sodium (VOLTAREN ) 1 % GEL Apply 2 g topically 4 (four) times daily. (Patient taking differently: Apply 2 g topically as needed (Pain).) 50 g 4   diphenoxylate -atropine  (LOMOTIL ) 2.5-0.025 MG tablet Take 1 tablet by mouth 4 (four) times daily as needed for diarrhea or loose stools. 60 tablet 0   dronabinol  (MARINOL ) 5 MG capsule Take 1 capsule (5 mg total) by mouth 2 (two) times daily before lunch and supper. 60 capsule 0   ENTRESTO  24-26 MG TAKE ONE (1) TABLET BY MOUTH TWICE DAILY (Patient taking differently: Take 1 tablet by mouth 2 (two) times daily.) 60 tablet 10   EPINEPHRINE  0.3 mg/0.3 mL IJ SOAJ injection Inject 0.3 mLs (0.3 mg total) into the muscle as needed for anaphylaxis. 1 each 2   esomeprazole  (NEXIUM ) 20 MG capsule TAKE ONE (1) CAPSULE BY MOUTH ONCE DAILY 90 capsule 3   FARXIGA 5 MG TABS tablet Take 5 mg by mouth daily.     ferrous sulfate  325 (65 FE) MG tablet Take 1 tablet (325 mg total) by mouth at bedtime.     fluticasone  (FLONASE ) 50 MCG/ACT nasal spray USE 2 SPRAYS IN EACH NOSTRILS DAILY AS NEEDED (  Patient taking differently: Place 2 sprays into both nostrils daily as needed for allergies or rhinitis.) 48 g 0   folic acid  (FOLVITE ) 1 MG tablet Take 1 tablet (1 mg total) by mouth daily.     ipratropium-albuterol  (DUONEB) 0.5-2.5 (3) MG/3ML SOLN USE 1 VIAL VIA NEBULIZER EVERY 6 HOURS AS NEEDED FOR SHORTNESS OF BREATH (Patient taking differently: Take 3 mLs by nebulization every 6 (six) hours as needed (SOB).) 90 mL 0   levocetirizine (XYZAL) 5 MG tablet Take 5 mg by mouth daily as needed for allergies.     lidocaine  (LIDODERM ) 5 % Place 1 patch onto the skin daily. Remove & Discard patch within 12 hours or  as directed by MD     lidocaine -prilocaine  (EMLA ) cream Apply to affected area once (Patient taking differently: Apply 1 Application topically once. Apply to affected area once) 30 g 3   lubiprostone  (AMITIZA ) 24 MCG capsule TAKE 1 CAPSULE(24 MCG) BY MOUTH TWICE DAILY WITH A MEAL (Patient taking differently: Take 24 mcg by mouth 2 (two) times daily with a meal.) 180 capsule 1   methadone  (DOLOPHINE ) 10 MG tablet Take 1 tablet (10 mg total) by mouth every 8 (eight) hours. 90 tablet 0   methylPREDNISolone  (MEDROL  DOSEPAK) 4 MG TBPK tablet If significant rash, will start at 6 tabs on day 1, then 5 tabs on day 2, then 4 tabs on day 3, then 3 tabs on day 4, then 2 tabs on day 5 and then 1 tab on day 6. Take with food. 21 tablet 0   metoCLOPramide  (REGLAN ) 10 MG tablet Take 1 tablet (10 mg total) by mouth every 8 (eight) hours as needed for nausea. 45 tablet 1   Multiple Vitamin (MULTIVITAMIN WITH MINERALS) TABS tablet Take 1 tablet by mouth daily.     naloxone  (NARCAN ) nasal spray 4 mg/0.1 mL Place 1 spray into the nose once.     nitroGLYCERIN  (NITROSTAT ) 0.4 MG SL tablet Place 1 tablet (0.4 mg total) under the tongue every 5 (five) minutes as needed for chest pain. 25 tablet 6   ondansetron  (ZOFRAN ) 4 MG tablet Take 1 tablet (4 mg total) by mouth every 8 (eight) hours as needed for nausea or vomiting. 20 tablet 0   ondansetron  (ZOFRAN ) 8 MG tablet Take 1 tablet (8 mg total) by mouth every 8 (eight) hours as needed for nausea or vomiting. 30 tablet 1   [START ON 03/22/2024] Oxycodone  HCl 20 MG TABS Take 1 tablet (20 mg total) by mouth every 4 (four) hours as needed. 90 tablet 0   prochlorperazine  (COMPAZINE ) 10 MG tablet Take 1 tablet (10 mg total) by mouth every 6 (six) hours as needed for nausea or vomiting. 30 tablet 3   ranolazine  (RANEXA ) 500 MG 12 hr tablet TAKE 1 TABLET BY MOUTH TWICE DAILY 60 tablet 10   rosuvastatin  (CRESTOR ) 20 MG tablet TAKE 1 TABLET BY MOUTH ONCE DAILY 90 tablet 1   sertraline   (ZOLOFT ) 100 MG tablet TAKE 1/2 TABLET(50 MG) BY MOUTH DAILY (Patient taking differently: Take 50 mg by mouth daily.) 45 tablet 1   sulfamethoxazole -trimethoprim  (BACTRIM  DS) 800-160 MG tablet Take 1 tablet by mouth 2 (two) times daily. 14 tablet 0   tiZANidine  (ZANAFLEX ) 4 MG tablet Take 1 tablet (4 mg total) by mouth at bedtime. 30 tablet 3   topiramate  (TOPAMAX ) 50 MG tablet TAKE TWO (2) TABLETS BY MOUTH EVERY DAY AT BEDTIME (Patient taking differently: Take 50 mg by mouth at bedtime. TAKE TWO (2)  TABLETS BY MOUTH EVERY DAY AT BEDTIME) 60 tablet 10   Ubrogepant  (UBRELVY ) 100 MG TABS Take 1 tablet (100 mg total) by mouth as needed (May repeat after 2 hours.  Maximum 2 tablets in 24 hours). TAKE 1 TABLET BY MOUTH BY MOUTH AS NEEDED( MAY REPEAT 1 TABLET AFTER 2 HOURS IF NEEDED, MAXIMUM 2 TABLETS IN 24 HOURS) Strength: 100 mg 48 tablet 1   Vitamin D , Ergocalciferol , (DRISDOL ) 1.25 MG (50000 UNIT) CAPS capsule TAKE 1 CAPSULE BY MOUTH EVERY 7 DAYS FOR 12 DOSES (Patient taking differently: Take 50,000 Units by mouth every 7 (seven) days. Patient usually takes this medication on mondays) 12 capsule 0   No current facility-administered medications for this visit.    VITAL SIGNS: There were no vitals taken for this visit. There were no vitals filed for this visit.  Estimated body mass index is 30.73 kg/m as calculated from the following:   Height as of 12/24/23: 4' 10 (1.473 m).   Weight as of 02/25/24: 147 lb 0.8 oz (66.7 kg).  Assessment NAD, resting in bed Normal breathing pattern RRR AAO x3  PERFORMANCE STATUS (ECOG) : 3 - Symptomatic, >50% confined to bed  IMPRESSION: Discussed the use of AI scribe software for clinical note transcription with the patient, who gave verbal consent to proceed.  History of Present Illness [name] is a 68 year old female with chronic pain and metastatic bladder cancer with T5 bone involvement who I connected with by phone for symptom management  follow-up. Denies concerns with uncontrolled nausea, vomiting, or constipation. Her appetite continues to improve.   Daughter shares patient is experiencing issues with a medical device that was recently changed. The nurse initially had difficulty with the procedure, requiring two attempts and assistance from another nurse. Since the change, the device has been leaking and causing irritation.   She is experiencing significant discomfort due to sores on her buttocks, described as 'completely raw' in the area where she sits. Various creams have been tried without success. Her daughter has been assisting her in changing positions to alleviate pressure, but the relief is temporary. She spends most of her time sitting in a chair. We discussed use of cushion in chair such as egg foam cushion or use of sacral silicone dressing. Daughter verbalized understanding.   She has fluctuating pain levels, which have remained consistent. Her physical activity is limited by swelling in her feet, which varies day to day. On days with less swelling, she is able to walk more, as noted by her physical therapist. However, on days with significant swelling, her feet feel like 'weights', limiting her mobility.  We discussed Ms. Pine's pain at length. She reports pain is well controlled on current regimen which includes methadone  10mg  every 8 hours with oxycodone  20mg  as needed for severe and breakthrough pain. Tizanidine  as needed. No adjustments to current regimen.   We will continue to closely follow and support. All questions answered.    I discussed the importance of continued conversation with family and their medical providers regarding overall plan of care and treatment options, ensuring decisions are within the context of the patients values and GOCs. Assessment & Plan  Chronic pain management Chronic abdominal pain worsened prompting patient to restart methadone . Methadone  effective with regular oxycodone  use.  -  Refill methadone , oxycodone , tizanidine . - Continue with oxycodone  to 20 mg every 4 hours as needed. - Continue Methadone  10mg  every 8 hours.   Cancer under active treatment Undergoing active cancer  treatment, including chemotherapy. - Ensure attendance at the scheduled chemotherapy and infusion appointment on Friday.  Pressure ulcer of sacral region Experiencing significant discomfort due to a pressure ulcer in the sacral region. The area is raw and painful, likely due to prolonged sitting. Various creams have been tried with limited success. Alternating positions provides only temporary relief. - Recommend using a donut cushion or a twin size egg crate foam cut into a square to relieve pressure on the sacral region. - Suggest looking for foam padding with skin barrier tape on [employer] for additional support. - Plan to check for hospital-grade rectal padding during hospital rounds and provide if available.  Swelling of feet Experiences intermittent swelling of the feet, affecting mobility. On days with significant swelling, feet feel weighted, limiting ability to walk. Swelling varies day by day, impacting physical therapy participation.  Medication management Managing medications including tizanidine  and oxycodone  for muscle relaxation and pain management. Needs refills on these medications. - Call the pharmacy to arrange for a refill of tizanidine , which has three refills available. - Check with the pharmacy on Monday for a refill of oxycodone , which is due on the 29th.  Follow-up I will plan to see patient back in 4-6 weeks.   Patient expressed understanding and was in agreement with this plan. She also understands that She can call the clinic at any time with any questions, concerns, or complaints.   Any controlled substances utilized were prescribed in the context of palliative care. PDMP has been reviewed.   Visit consisted of counseling and education dealing with the complex and  emotionally intense issues of symptom management and palliative care in the setting of serious and potentially life-threatening illness.  Levon Borer, AGPCNP-BC  Palliative Medicine Team/[facility]

## 2024-03-18 NOTE — Progress Notes (Unsigned)
 [facility] OFFICE PROGRESS NOTE  Patient Care Team: Teressa Harrie HERO, FNP as PCP - General (Family Medicine) Inocencio Soyla Lunger, MD as PCP - Electrophysiology (Cardiology) Bernie Lamar PARAS, MD as PCP - Cardiology (Cardiology) Mavis Purchase, MD as Consulting Physician (Neurosurgery) Bernie Lamar PARAS, MD as Consulting Physician (Cardiology) Sheldon Standing, MD as Consulting Physician (General Surgery) Skeet Juliene SAUNDERS, DO as Consulting Physician (Neurology) Delores Lauraine NOVAK, [facility] (Inactive) as Pharmacist (Pharmacist) Saintclair Jasper, MD as Consulting Physician (Gastroenterology) Zehr, Harlene BIRCH, PA-C as Physician Assistant (Gastroenterology) Dennise Hoes, MD as Consulting Physician (Nephrology) Pickenpack-Cousar, Fannie SAILOR, NP as Nurse Practitioner (Hospice and Palliative Medicine)  68 y.o.female with past medical history of COPD, chronic back from from DDD, osteoporosis with fractures presenting with back pain radiating down to the hip in March of 2025 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  Cystoscopy with TURBT show right sided bladder tumor with palpable anterior vaginal mass.  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    Current diagnosis: Stage IVB UC with bone metastases Treatment:  12/18/23 C1D1 EV/P   Interval response to treatment after C3. Discussed with daughter. They canceled the appointment report too early. Report she has been improving weekly. She is tolerating treatment though it is palliative treatment and daughter said her mother would like to continue. Her PS is improving slowly. Will try to schedule later appointment.   With persistent bilateral leg swelling off diuretics, ok to resume lasix  20 mg daily this next week.  Neuropathy Assessment & Plan   No orders of the defined types were placed in this encounter.    [name], MD  INTERVAL HISTORY: Patient returns for  follow-up.  Oncology History  Urothelial carcinoma of bladder (HCC)  11/18/2023 Initial Diagnosis   Urothelial carcinoma of bladder (HCC)   12/01/2023 Cancer Staging   Staging form: Urinary Bladder, AJCC 8th Edition - Clinical: Stage IVB (cTX, cNX, pM1b) - Signed by Chihuahua Pauletta JAYSON, MD on 12/01/2023 WHO/ISUP grade (low/high): High Grade Histologic grading system: 2 grade system   12/18/2023 -  Chemotherapy   12/18/23 C1D1 01/08/24 C2D1 01/29/24 C3D1. Pembro only. Day 8 reduced EV to 1mg /kg Patient is on Treatment Plan : UROTHELIAL ADVANCED, METASTATIC ENFORTUMAB D1, D8 + PEMBROLIZUMAB  (200) D1 Q21D      02/12/2024 PET scan   PET 1. Response to therapy of osseous metastasis. 2. The pelvis is again poorly evaluated. Soft tissue density within the deep pelvis is less distinct today with decreased hypermetabolism. Potential etiologies again include response to therapy of a synchronous uterine primary versus isolated metastasis (presuming the patient is status post hysterectomy). 3. Placement of a right ureteric stent with similar mild right-sided hydronephrosis. The left-sided hydroureteronephrosis has resolved. 4.  Aortic Atherosclerosis (ICD10-I70.0).      PHYSICAL EXAMINATION: ECOG PERFORMANCE STATUS: {CHL ONC ECOG PS:[id number]}  There were no vitals filed for this visit. There were no vitals filed for this visit.  GENERAL: alert, no distress and comfortable SKIN: skin color normal and no bruising or petechiae or jaundice on exposed skin EYES: normal, sclera clear OROPHARYNX: no exudate  NECK: No palpable mass LYMPH:  no palpable cervical, axillary lymphadenopathy  LUNGS: clear to auscultation and percussion with normal breathing effort HEART: regular rate & rhythm  ABDOMEN: abdomen soft, non-tender and nondistended. Musculoskeletal: no edema NEURO: no focal motor/sensory deficits  Relevant data reviewed during this visit included ***

## 2024-03-19 ENCOUNTER — Inpatient Hospital Stay: Admitting: Dietician

## 2024-03-19 ENCOUNTER — Other Ambulatory Visit: Payer: Self-pay

## 2024-03-19 ENCOUNTER — Inpatient Hospital Stay

## 2024-03-19 ENCOUNTER — Inpatient Hospital Stay (HOSPITAL_BASED_OUTPATIENT_CLINIC_OR_DEPARTMENT_OTHER)

## 2024-03-19 ENCOUNTER — Other Ambulatory Visit (HOSPITAL_COMMUNITY): Payer: Self-pay

## 2024-03-19 VITALS — Wt 132.0 lb

## 2024-03-19 VITALS — BP 101/70 | HR 93 | Temp 97.2°F | Resp 18

## 2024-03-19 DIAGNOSIS — R600 Localized edema: Secondary | ICD-10-CM

## 2024-03-19 DIAGNOSIS — M8458XA Pathological fracture in neoplastic disease, other specified site, initial encounter for fracture: Secondary | ICD-10-CM

## 2024-03-19 DIAGNOSIS — Z515 Encounter for palliative care: Secondary | ICD-10-CM

## 2024-03-19 DIAGNOSIS — I5022 Chronic systolic (congestive) heart failure: Secondary | ICD-10-CM | POA: Diagnosis not present

## 2024-03-19 DIAGNOSIS — Z79891 Long term (current) use of opiate analgesic: Secondary | ICD-10-CM | POA: Diagnosis not present

## 2024-03-19 DIAGNOSIS — R197 Diarrhea, unspecified: Secondary | ICD-10-CM | POA: Diagnosis not present

## 2024-03-19 DIAGNOSIS — D631 Anemia in chronic kidney disease: Secondary | ICD-10-CM | POA: Diagnosis not present

## 2024-03-19 DIAGNOSIS — E876 Hypokalemia: Secondary | ICD-10-CM | POA: Diagnosis not present

## 2024-03-19 DIAGNOSIS — C679 Malignant neoplasm of bladder, unspecified: Secondary | ICD-10-CM | POA: Diagnosis not present

## 2024-03-19 DIAGNOSIS — Z87891 Personal history of nicotine dependence: Secondary | ICD-10-CM | POA: Diagnosis not present

## 2024-03-19 DIAGNOSIS — Z79620 Long term (current) use of immunosuppressive biologic: Secondary | ICD-10-CM | POA: Diagnosis not present

## 2024-03-19 DIAGNOSIS — G893 Neoplasm related pain (acute) (chronic): Secondary | ICD-10-CM

## 2024-03-19 DIAGNOSIS — Z5112 Encounter for antineoplastic immunotherapy: Secondary | ICD-10-CM | POA: Diagnosis not present

## 2024-03-19 DIAGNOSIS — D6481 Anemia due to antineoplastic chemotherapy: Secondary | ICD-10-CM | POA: Diagnosis not present

## 2024-03-19 DIAGNOSIS — N1831 Chronic kidney disease, stage 3a: Secondary | ICD-10-CM | POA: Diagnosis not present

## 2024-03-19 DIAGNOSIS — Z79899 Other long term (current) drug therapy: Secondary | ICD-10-CM | POA: Diagnosis not present

## 2024-03-19 DIAGNOSIS — I2510 Atherosclerotic heart disease of native coronary artery without angina pectoris: Secondary | ICD-10-CM | POA: Diagnosis not present

## 2024-03-19 DIAGNOSIS — R110 Nausea: Secondary | ICD-10-CM | POA: Diagnosis not present

## 2024-03-19 DIAGNOSIS — C7951 Secondary malignant neoplasm of bone: Secondary | ICD-10-CM | POA: Diagnosis not present

## 2024-03-19 LAB — CBC WITH DIFFERENTIAL (CANCER CENTER ONLY)
Abs Immature Granulocytes: 0.05 K/uL (ref 0.00–0.07)
Basophils Absolute: 0.1 K/uL (ref 0.0–0.1)
Basophils Relative: 1 %
Eosinophils Absolute: 1.1 K/uL — ABNORMAL HIGH (ref 0.0–0.5)
Eosinophils Relative: 11 %
HCT: 26.2 % — ABNORMAL LOW (ref 36.0–46.0)
Hemoglobin: 8.6 g/dL — ABNORMAL LOW (ref 12.0–15.0)
Immature Granulocytes: 1 %
Lymphocytes Relative: 14 %
Lymphs Abs: 1.4 K/uL (ref 0.7–4.0)
MCH: 29.9 pg (ref 26.0–34.0)
MCHC: 32.8 g/dL (ref 30.0–36.0)
MCV: 91 fL (ref 80.0–100.0)
Monocytes Absolute: 0.8 K/uL (ref 0.1–1.0)
Monocytes Relative: 8 %
Neutro Abs: 6.4 K/uL (ref 1.7–7.7)
Neutrophils Relative %: 65 %
Platelet Count: 280 K/uL (ref 150–400)
RBC: 2.88 MIL/uL — ABNORMAL LOW (ref 3.87–5.11)
RDW: 16.4 % — ABNORMAL HIGH (ref 11.5–15.5)
WBC Count: 9.9 K/uL (ref 4.0–10.5)
nRBC: 0 % (ref 0.0–0.2)

## 2024-03-19 LAB — CMP (CANCER CENTER ONLY)
ALT: 7 U/L (ref 0–44)
AST: 14 U/L — ABNORMAL LOW (ref 15–41)
Albumin: 2.5 g/dL — ABNORMAL LOW (ref 3.5–5.0)
Alkaline Phosphatase: 71 U/L (ref 38–126)
Anion gap: 6 (ref 5–15)
BUN: 19 mg/dL (ref 8–23)
CO2: 25 mmol/L (ref 22–32)
Calcium: 8.5 mg/dL — ABNORMAL LOW (ref 8.9–10.3)
Chloride: 106 mmol/L (ref 98–111)
Creatinine: 1.3 mg/dL — ABNORMAL HIGH (ref 0.44–1.00)
GFR, Estimated: 45 mL/min — ABNORMAL LOW (ref >60)
Glucose, Bld: 109 mg/dL — ABNORMAL HIGH (ref 70–99)
Potassium: 3.3 mmol/L — ABNORMAL LOW (ref 3.5–5.1)
Sodium: 137 mmol/L (ref 135–145)
Total Bilirubin: 0.3 mg/dL (ref 0.0–1.2)
Total Protein: 5.8 g/dL — ABNORMAL LOW (ref 6.5–8.1)

## 2024-03-19 LAB — T4, FREE: Free T4: 1.18 ng/dL — ABNORMAL HIGH (ref 0.61–1.12)

## 2024-03-19 LAB — MAGNESIUM: Magnesium: 1.5 mg/dL — ABNORMAL LOW (ref 1.7–2.4)

## 2024-03-19 MED ORDER — POTASSIUM CHLORIDE CRYS ER 10 MEQ PO TBCR
20.0000 meq | EXTENDED_RELEASE_TABLET | Freq: Two times a day (BID) | ORAL | 1 refills | Status: DC
  Filled 2024-03-19: qty 180, 45d supply, fill #0
  Filled 2024-05-08: qty 180, 45d supply, fill #1

## 2024-03-19 MED ORDER — TIZANIDINE HCL 4 MG PO TABS
4.0000 mg | ORAL_TABLET | Freq: Every day | ORAL | 3 refills | Status: DC
  Filled 2024-03-19 (×2): qty 30, 30d supply, fill #0
  Filled 2024-04-14: qty 30, 30d supply, fill #1
  Filled 2024-05-18: qty 30, 30d supply, fill #2

## 2024-03-19 MED ORDER — SODIUM CHLORIDE 0.9% FLUSH
10.0000 mL | Freq: Once | INTRAVENOUS | Status: AC
  Administered 2024-03-19: 10 mL

## 2024-03-19 MED ORDER — MAGNESIUM SULFATE 2 GM/50ML IV SOLN
2.0000 g | Freq: Once | INTRAVENOUS | Status: AC
  Administered 2024-03-19: 2 g via INTRAVENOUS
  Filled 2024-03-19: qty 50

## 2024-03-19 MED ORDER — PROCHLORPERAZINE MALEATE 10 MG PO TABS
10.0000 mg | ORAL_TABLET | Freq: Once | ORAL | Status: AC
  Administered 2024-03-19: 10 mg via ORAL
  Filled 2024-03-19: qty 1

## 2024-03-19 MED ORDER — SODIUM CHLORIDE 0.9% FLUSH
10.0000 mL | INTRAVENOUS | Status: DC | PRN
Start: 2024-03-19 — End: 2024-03-19
  Administered 2024-03-19: 10 mL

## 2024-03-19 MED ORDER — SODIUM CHLORIDE 0.9 % IV SOLN
200.0000 mg | Freq: Once | INTRAVENOUS | Status: AC
  Administered 2024-03-19: 200 mg via INTRAVENOUS
  Filled 2024-03-19: qty 200

## 2024-03-19 MED ORDER — SODIUM CHLORIDE 0.9 % IV SOLN
INTRAVENOUS | Status: DC
Start: 2024-03-19 — End: 2024-03-19

## 2024-03-19 MED ORDER — HEPARIN SOD (PORK) LOCK FLUSH 100 UNIT/ML IV SOLN
500.0000 [IU] | Freq: Once | INTRAVENOUS | Status: AC | PRN
  Administered 2024-03-19: 500 [IU]

## 2024-03-19 MED ORDER — OXYCODONE HCL 20 MG PO TABS
20.0000 mg | ORAL_TABLET | ORAL | 0 refills | Status: DC | PRN
  Filled 2024-03-22: qty 90, 15d supply, fill #0
  Filled ????-??-?? (×2): fill #0

## 2024-03-19 MED ORDER — SODIUM CHLORIDE 0.9 % IV SOLN
1.0000 mg/kg | Freq: Once | INTRAVENOUS | Status: AC
  Administered 2024-03-19: 70 mg via INTRAVENOUS
  Filled 2024-03-19: qty 3

## 2024-03-19 NOTE — Patient Instructions (Addendum)
 [facility] - A DEPT OF [facility]  Discharge Instructions: Thank you for choosing [facility] to provide your oncology and hematology care.   If you have a lab appointment with the Cancer Center, please go directly to the Cancer Center and check in at the registration area.   Wear comfortable clothing and clothing appropriate for easy access to any Portacath or PICC line.   We strive to give you quality time with your provider. You may need to reschedule your appointment if you arrive late (15 or more minutes).  Arriving late affects you and other patients whose appointments are after yours.  Also, if you miss three or more appointments without notifying the office, you may be dismissed from the clinic at the provider's discretion.      For prescription refill requests, have your pharmacy contact our office and allow 72 hours for refills to be completed.    Today you received the following chemotherapy and/or immunotherapy agents: Padcev  and Keytruda       To help prevent nausea and vomiting after your treatment, we encourage you to take your nausea medication as directed.  BELOW ARE SYMPTOMS THAT SHOULD BE REPORTED IMMEDIATELY: *FEVER GREATER THAN 100.4 F (38 C) OR HIGHER *CHILLS OR SWEATING *NAUSEA AND VOMITING THAT IS NOT CONTROLLED WITH YOUR NAUSEA MEDICATION *UNUSUAL SHORTNESS OF BREATH *UNUSUAL BRUISING OR BLEEDING *URINARY PROBLEMS (pain or burning when urinating, or frequent urination) *BOWEL PROBLEMS (unusual diarrhea, constipation, pain near the anus) TENDERNESS IN MOUTH AND THROAT WITH OR WITHOUT PRESENCE OF ULCERS (sore throat, sores in mouth, or a toothache) UNUSUAL RASH, SWELLING OR PAIN  UNUSUAL VAGINAL DISCHARGE OR ITCHING   Items with * indicate a potential emergency and should be followed up as soon as possible or go to the Emergency Department if any problems should occur.  Please show the CHEMOTHERAPY ALERT CARD or  IMMUNOTHERAPY ALERT CARD at check-in to the Emergency Department and triage nurse.  Should you have questions after your visit or need to cancel or reschedule your appointment, please contact [facility] - A DEPT OF [facility]  Dept: [phone]  and follow the prompts.  Office hours are 8:00 a.m. to 4:30 p.m. Monday - Friday. Please note that voicemails left after 4:00 p.m. may not be returned until the following business day.  We are closed weekends and major holidays. You have access to a nurse at all times for urgent questions. Please call the main number to the clinic Dept: [phone] and follow the prompts.   For any non-urgent questions, you may also contact your provider using MyChart. We now offer e-Visits for anyone 38 and older to request care online for non-urgent symptoms. For details visit [email or web address].   Also download the MyChart app! Go to the app store, search MyChart, open the app, select [facility], and log in with your MyChart username and password.  Magnesium  Sulfate Injection What is this medication? MAGNESIUM  SULFATE (mag NEE zee um SUL fate) prevents and treats low levels of magnesium  in your body. It may also be used to prevent and treat seizures during pregnancy in people with high blood pressure disorders, such as preeclampsia or eclampsia. Magnesium  plays an important role in maintaining the health of your muscles and nervous system. This medicine may be used for other purposes; ask your health care provider or pharmacist if you have questions. What should I tell my care team  before I take this medication? They need to know if you have any of these conditions: Heart disease History of irregular heart beat Kidney disease An unusual or allergic reaction to magnesium  sulfate, medications, foods, dyes, or preservatives Pregnant or trying to get pregnant Breast-feeding How should I use this medication? This medication is  for infusion into a vein. It is given in a hospital or clinic setting. Talk to your care team about the use of this medication in children. While this medication may be prescribed for selected conditions, precautions do apply. Overdosage: If you think you have taken too much of this medicine contact a poison control center or emergency room at once. NOTE: This medicine is only for you. Do not share this medicine with others. What if I miss a dose? This does not apply. What may interact with this medication? Certain medications for anxiety or sleep Certain medications for seizures, such phenobarbital Digoxin Medications that relax muscles for surgery Narcotic medications for pain This list may not describe all possible interactions. Give your health care provider a list of all the medicines, herbs, non-prescription drugs, or dietary supplements you use. Also tell them if you smoke, drink alcohol, or use illegal drugs. Some items may interact with your medicine. What should I watch for while using this medication? Your condition will be monitored carefully while you are receiving this medication. You may need blood work done while you are receiving this medication. What side effects may I notice from receiving this medication? Side effects that you should report to your care team as soon as possible: Allergic reactions--skin rash, itching, hives, swelling of the face, lips, tongue, or throat High magnesium  level--confusion, drowsiness, facial flushing, redness, sweating, muscle weakness, fast or irregular heartbeat, trouble breathing Low blood pressure--dizziness, feeling faint or lightheaded, blurry vision Side effects that usually do not require medical attention (report to your care team if they continue or are bothersome): Headache Nausea This list may not describe all possible side effects. Call your doctor for medical advice about side effects. You may report side effects to FDA at  1-800-FDA-1088. Where should I keep my medication? This medication is given in a hospital or clinic and will not be stored at home. NOTE: This sheet is a summary. It may not cover all possible information. If you have questions about this medicine, talk to your doctor, pharmacist, or health care provider.  2024 Elsevier/Gold Standard (2021-04-25 00:00:00)

## 2024-03-19 NOTE — Assessment & Plan Note (Addendum)
 Reduced dose of EV to 1mg /kg for neuropathy Will continue on day 1 only every 3 weeks moving forward Seeing palliative care as well for chronic pain management Continue EVP as tolerated Follow up next week

## 2024-03-19 NOTE — Progress Notes (Signed)
 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 She is receiving palliative padcev  + keytruda  q21d. Patient under the care of Dr. Tina.  Met with patient and daughter in infusion. Daughter reports appetite is improving. Pt eating one good meal and will usually snack. Today, she ate 6 inch meatball sub and had a few chips. Patient has been drinking 2 Ensure Complete. One in the morning and one in the evening. Nausea and diarrhea have improved. Patient is working with PT at home. She is now able to get up with assistance and using a walker for ambulation at home.    Medications: lasix , compazine , methadone  (new since 6/5 RD f/u)  Labs: glucose 109, Mg 1.5, K 3.3, Cr 1.30, albumin  2.5  Anthropometrics: Wt 132 lb today (persistent BLE edema - restart lasix  per MD)  7/2 - 147 lb 0.8 oz  6/12 - 154 lb 15.7 oz (bed wt) 5/1 - 142 lb 4 oz (standing wt)  NUTRITION DIAGNOSIS: Unintended wt loss - ongoing (fluid masking actual wt)   INTERVENTION:  Educated on sources of protein, recommend protein food at every meal and offered snack suggestions (cottage cheese, with fruit, bean dip/sour cream with chips, cheese with fruit) Encourage oral intake prior to drinking supplement as these are filling, recommend 2-3/day in between meals - samples of Ensure Complete, coupons + CIB powder  Snack ideas, soft moist high protein foods, shake recipes provided   MONITORING, EVALUATION, GOAL: wt trends, intake    NEXT VISIT: Friday August 15 during infusion

## 2024-03-19 NOTE — Assessment & Plan Note (Addendum)
 Continue Lasix 20 mg twice daily

## 2024-03-22 ENCOUNTER — Other Ambulatory Visit (HOSPITAL_COMMUNITY): Payer: Self-pay

## 2024-03-24 DIAGNOSIS — C679 Malignant neoplasm of bladder, unspecified: Secondary | ICD-10-CM | POA: Diagnosis not present

## 2024-03-24 DIAGNOSIS — J9611 Chronic respiratory failure with hypoxia: Secondary | ICD-10-CM | POA: Diagnosis not present

## 2024-03-24 DIAGNOSIS — M47814 Spondylosis without myelopathy or radiculopathy, thoracic region: Secondary | ICD-10-CM | POA: Diagnosis not present

## 2024-03-24 DIAGNOSIS — M4726 Other spondylosis with radiculopathy, lumbar region: Secondary | ICD-10-CM | POA: Diagnosis not present

## 2024-03-24 DIAGNOSIS — M899 Disorder of bone, unspecified: Secondary | ICD-10-CM | POA: Diagnosis not present

## 2024-03-24 DIAGNOSIS — E785 Hyperlipidemia, unspecified: Secondary | ICD-10-CM | POA: Diagnosis not present

## 2024-03-24 DIAGNOSIS — M069 Rheumatoid arthritis, unspecified: Secondary | ICD-10-CM | POA: Diagnosis not present

## 2024-03-24 DIAGNOSIS — E440 Moderate protein-calorie malnutrition: Secondary | ICD-10-CM | POA: Diagnosis not present

## 2024-03-24 DIAGNOSIS — G8220 Paraplegia, unspecified: Secondary | ICD-10-CM | POA: Diagnosis not present

## 2024-03-24 DIAGNOSIS — J4489 Other specified chronic obstructive pulmonary disease: Secondary | ICD-10-CM | POA: Diagnosis not present

## 2024-03-24 DIAGNOSIS — I420 Dilated cardiomyopathy: Secondary | ICD-10-CM | POA: Diagnosis not present

## 2024-03-24 DIAGNOSIS — Z936 Other artificial openings of urinary tract status: Secondary | ICD-10-CM | POA: Diagnosis not present

## 2024-03-24 DIAGNOSIS — M8008XD Age-related osteoporosis with current pathological fracture, vertebra(e), subsequent encounter for fracture with routine healing: Secondary | ICD-10-CM | POA: Diagnosis not present

## 2024-03-24 DIAGNOSIS — D630 Anemia in neoplastic disease: Secondary | ICD-10-CM | POA: Diagnosis not present

## 2024-03-24 DIAGNOSIS — F5101 Primary insomnia: Secondary | ICD-10-CM | POA: Diagnosis not present

## 2024-03-24 DIAGNOSIS — N1330 Unspecified hydronephrosis: Secondary | ICD-10-CM | POA: Diagnosis not present

## 2024-03-24 DIAGNOSIS — N1832 Chronic kidney disease, stage 3b: Secondary | ICD-10-CM | POA: Diagnosis not present

## 2024-03-24 DIAGNOSIS — D631 Anemia in chronic kidney disease: Secondary | ICD-10-CM | POA: Diagnosis not present

## 2024-03-24 DIAGNOSIS — I5042 Chronic combined systolic (congestive) and diastolic (congestive) heart failure: Secondary | ICD-10-CM | POA: Diagnosis not present

## 2024-03-24 DIAGNOSIS — D520 Dietary folate deficiency anemia: Secondary | ICD-10-CM | POA: Diagnosis not present

## 2024-03-24 DIAGNOSIS — L89312 Pressure ulcer of right buttock, stage 2: Secondary | ICD-10-CM | POA: Diagnosis not present

## 2024-03-24 DIAGNOSIS — I2510 Atherosclerotic heart disease of native coronary artery without angina pectoris: Secondary | ICD-10-CM | POA: Diagnosis not present

## 2024-03-26 ENCOUNTER — Other Ambulatory Visit

## 2024-03-26 ENCOUNTER — Ambulatory Visit

## 2024-03-30 ENCOUNTER — Other Ambulatory Visit: Payer: Self-pay

## 2024-03-30 ENCOUNTER — Telehealth: Payer: Self-pay | Admitting: Family Medicine

## 2024-03-30 DIAGNOSIS — D630 Anemia in neoplastic disease: Secondary | ICD-10-CM | POA: Diagnosis not present

## 2024-03-30 DIAGNOSIS — E782 Mixed hyperlipidemia: Secondary | ICD-10-CM | POA: Diagnosis not present

## 2024-03-30 DIAGNOSIS — N1832 Chronic kidney disease, stage 3b: Secondary | ICD-10-CM | POA: Diagnosis not present

## 2024-03-30 DIAGNOSIS — C679 Malignant neoplasm of bladder, unspecified: Secondary | ICD-10-CM | POA: Diagnosis not present

## 2024-03-30 DIAGNOSIS — E440 Moderate protein-calorie malnutrition: Secondary | ICD-10-CM | POA: Diagnosis not present

## 2024-03-30 DIAGNOSIS — I5042 Chronic combined systolic (congestive) and diastolic (congestive) heart failure: Secondary | ICD-10-CM | POA: Diagnosis not present

## 2024-03-30 DIAGNOSIS — M8008XD Age-related osteoporosis with current pathological fracture, vertebra(e), subsequent encounter for fracture with routine healing: Secondary | ICD-10-CM | POA: Diagnosis not present

## 2024-03-30 DIAGNOSIS — D631 Anemia in chronic kidney disease: Secondary | ICD-10-CM | POA: Diagnosis not present

## 2024-03-30 DIAGNOSIS — M4726 Other spondylosis with radiculopathy, lumbar region: Secondary | ICD-10-CM | POA: Diagnosis not present

## 2024-03-30 DIAGNOSIS — M069 Rheumatoid arthritis, unspecified: Secondary | ICD-10-CM | POA: Diagnosis not present

## 2024-03-30 DIAGNOSIS — Z7982 Long term (current) use of aspirin: Secondary | ICD-10-CM | POA: Diagnosis not present

## 2024-03-30 DIAGNOSIS — F5101 Primary insomnia: Secondary | ICD-10-CM | POA: Diagnosis not present

## 2024-03-30 DIAGNOSIS — Z515 Encounter for palliative care: Secondary | ICD-10-CM

## 2024-03-30 DIAGNOSIS — Z7951 Long term (current) use of inhaled steroids: Secondary | ICD-10-CM | POA: Diagnosis not present

## 2024-03-30 DIAGNOSIS — G893 Neoplasm related pain (acute) (chronic): Secondary | ICD-10-CM

## 2024-03-30 DIAGNOSIS — G8220 Paraplegia, unspecified: Secondary | ICD-10-CM | POA: Diagnosis not present

## 2024-03-30 DIAGNOSIS — M47814 Spondylosis without myelopathy or radiculopathy, thoracic region: Secondary | ICD-10-CM | POA: Diagnosis not present

## 2024-03-30 DIAGNOSIS — J9611 Chronic respiratory failure with hypoxia: Secondary | ICD-10-CM | POA: Diagnosis not present

## 2024-03-30 DIAGNOSIS — Z936 Other artificial openings of urinary tract status: Secondary | ICD-10-CM | POA: Diagnosis not present

## 2024-03-30 DIAGNOSIS — G8929 Other chronic pain: Secondary | ICD-10-CM

## 2024-03-30 DIAGNOSIS — F331 Major depressive disorder, recurrent, moderate: Secondary | ICD-10-CM

## 2024-03-30 DIAGNOSIS — J4489 Other specified chronic obstructive pulmonary disease: Secondary | ICD-10-CM | POA: Diagnosis not present

## 2024-03-30 DIAGNOSIS — D520 Dietary folate deficiency anemia: Secondary | ICD-10-CM | POA: Diagnosis not present

## 2024-03-30 DIAGNOSIS — I2510 Atherosclerotic heart disease of native coronary artery without angina pectoris: Secondary | ICD-10-CM | POA: Diagnosis not present

## 2024-03-30 DIAGNOSIS — I420 Dilated cardiomyopathy: Secondary | ICD-10-CM | POA: Diagnosis not present

## 2024-03-30 MED ORDER — METHADONE HCL 10 MG PO TABS: 10.0000 mg | ORAL_TABLET | Freq: Three times a day (TID) | ORAL | 0 refills | Status: DC

## 2024-03-30 NOTE — Telephone Encounter (Signed)
 [facility] [phone]

## 2024-03-31 ENCOUNTER — Telehealth: Payer: Self-pay | Admitting: Family Medicine

## 2024-03-31 ENCOUNTER — Telehealth: Payer: Self-pay

## 2024-03-31 NOTE — Telephone Encounter (Signed)
 Copied from CRM #[id number]. Topic: Clinical - Order For Equipment >> Mar 31, 2024  1:07 PM Antwanette L wrote: Reason for CRM: [name] from [facility] is calling  to place an order on behalf of the patient. The patient requires the following durable medical equipment (DME): shower chair, power wheelchair, and pressure cushion to relive ulcers. [name] can be contacted at [phone]

## 2024-03-31 NOTE — Telephone Encounter (Signed)
 Called [name] and left message that provider approved physical therapy.  Copied from CRM #[id number]. Topic: Clinical - Home Health Verbal Orders >> Mar 31, 2024  1:03 PM Antwanette L wrote: Caller/Agency: Wendy/ [facility] Callback Number: [phone] Service Requested: Physical Therapy Frequency: 1w8(one visit per week for 8 weeks) Any new concerns about the patient? No

## 2024-03-31 NOTE — Telephone Encounter (Signed)
 [facility] - ORDER [phone]

## 2024-04-02 ENCOUNTER — Other Ambulatory Visit: Payer: Self-pay

## 2024-04-02 DIAGNOSIS — E079 Disorder of thyroid, unspecified: Secondary | ICD-10-CM | POA: Insufficient documentation

## 2024-04-02 DIAGNOSIS — I429 Cardiomyopathy, unspecified: Secondary | ICD-10-CM | POA: Insufficient documentation

## 2024-04-02 DIAGNOSIS — J449 Chronic obstructive pulmonary disease, unspecified: Secondary | ICD-10-CM | POA: Insufficient documentation

## 2024-04-05 ENCOUNTER — Ambulatory Visit: Attending: Cardiology | Admitting: Cardiology

## 2024-04-05 ENCOUNTER — Other Ambulatory Visit: Payer: Self-pay

## 2024-04-05 DIAGNOSIS — Z515 Encounter for palliative care: Secondary | ICD-10-CM

## 2024-04-05 DIAGNOSIS — R197 Diarrhea, unspecified: Secondary | ICD-10-CM

## 2024-04-05 DIAGNOSIS — M5489 Other dorsalgia: Secondary | ICD-10-CM

## 2024-04-05 DIAGNOSIS — G893 Neoplasm related pain (acute) (chronic): Secondary | ICD-10-CM

## 2024-04-05 DIAGNOSIS — C679 Malignant neoplasm of bladder, unspecified: Secondary | ICD-10-CM

## 2024-04-05 DIAGNOSIS — G8929 Other chronic pain: Secondary | ICD-10-CM

## 2024-04-05 MED ORDER — DIPHENOXYLATE-ATROPINE 2.5-0.025 MG PO TABS: 1.0000 | ORAL_TABLET | Freq: Four times a day (QID) | ORAL | 0 refills | Status: DC | PRN

## 2024-04-05 MED ORDER — METHADONE HCL 10 MG PO TABS
10.0000 mg | ORAL_TABLET | Freq: Three times a day (TID) | ORAL | 0 refills | Status: DC
Start: 2024-04-08 — End: 2024-04-06

## 2024-04-05 MED ORDER — OXYCODONE HCL 20 MG PO TABS
20.0000 mg | ORAL_TABLET | ORAL | 0 refills | Status: DC | PRN
Start: 2024-04-07 — End: 2024-04-06

## 2024-04-05 NOTE — Telephone Encounter (Signed)
 Appointment scheduled for in office. Daughter has been talking with patients CAP worker regarding a scooter. It is possible she will cancel the appointment on 8/27 for the power wheelchair eval. Aware that if she keeps it to contact insurance regarding which DME insurance prefers.

## 2024-04-06 ENCOUNTER — Other Ambulatory Visit: Payer: Self-pay | Admitting: Nurse Practitioner

## 2024-04-06 ENCOUNTER — Other Ambulatory Visit (HOSPITAL_COMMUNITY): Payer: Self-pay

## 2024-04-06 ENCOUNTER — Ambulatory Visit: Admitting: Family Medicine

## 2024-04-06 DIAGNOSIS — N1832 Chronic kidney disease, stage 3b: Secondary | ICD-10-CM | POA: Diagnosis not present

## 2024-04-06 DIAGNOSIS — M069 Rheumatoid arthritis, unspecified: Secondary | ICD-10-CM | POA: Diagnosis not present

## 2024-04-06 DIAGNOSIS — Z515 Encounter for palliative care: Secondary | ICD-10-CM

## 2024-04-06 DIAGNOSIS — D520 Dietary folate deficiency anemia: Secondary | ICD-10-CM | POA: Diagnosis not present

## 2024-04-06 DIAGNOSIS — G893 Neoplasm related pain (acute) (chronic): Secondary | ICD-10-CM

## 2024-04-06 DIAGNOSIS — M47814 Spondylosis without myelopathy or radiculopathy, thoracic region: Secondary | ICD-10-CM | POA: Diagnosis not present

## 2024-04-06 DIAGNOSIS — D631 Anemia in chronic kidney disease: Secondary | ICD-10-CM | POA: Diagnosis not present

## 2024-04-06 DIAGNOSIS — C679 Malignant neoplasm of bladder, unspecified: Secondary | ICD-10-CM | POA: Diagnosis not present

## 2024-04-06 DIAGNOSIS — I420 Dilated cardiomyopathy: Secondary | ICD-10-CM | POA: Diagnosis not present

## 2024-04-06 DIAGNOSIS — Z936 Other artificial openings of urinary tract status: Secondary | ICD-10-CM | POA: Diagnosis not present

## 2024-04-06 DIAGNOSIS — M5489 Other dorsalgia: Secondary | ICD-10-CM

## 2024-04-06 DIAGNOSIS — J4489 Other specified chronic obstructive pulmonary disease: Secondary | ICD-10-CM | POA: Diagnosis not present

## 2024-04-06 DIAGNOSIS — Z7951 Long term (current) use of inhaled steroids: Secondary | ICD-10-CM | POA: Diagnosis not present

## 2024-04-06 DIAGNOSIS — D630 Anemia in neoplastic disease: Secondary | ICD-10-CM | POA: Diagnosis not present

## 2024-04-06 DIAGNOSIS — R197 Diarrhea, unspecified: Secondary | ICD-10-CM

## 2024-04-06 DIAGNOSIS — F5101 Primary insomnia: Secondary | ICD-10-CM | POA: Diagnosis not present

## 2024-04-06 DIAGNOSIS — M8008XD Age-related osteoporosis with current pathological fracture, vertebra(e), subsequent encounter for fracture with routine healing: Secondary | ICD-10-CM | POA: Diagnosis not present

## 2024-04-06 DIAGNOSIS — G8220 Paraplegia, unspecified: Secondary | ICD-10-CM | POA: Diagnosis not present

## 2024-04-06 DIAGNOSIS — J9611 Chronic respiratory failure with hypoxia: Secondary | ICD-10-CM | POA: Diagnosis not present

## 2024-04-06 DIAGNOSIS — I2510 Atherosclerotic heart disease of native coronary artery without angina pectoris: Secondary | ICD-10-CM | POA: Diagnosis not present

## 2024-04-06 DIAGNOSIS — E782 Mixed hyperlipidemia: Secondary | ICD-10-CM | POA: Diagnosis not present

## 2024-04-06 DIAGNOSIS — I5042 Chronic combined systolic (congestive) and diastolic (congestive) heart failure: Secondary | ICD-10-CM | POA: Diagnosis not present

## 2024-04-06 DIAGNOSIS — M4726 Other spondylosis with radiculopathy, lumbar region: Secondary | ICD-10-CM | POA: Diagnosis not present

## 2024-04-06 DIAGNOSIS — G8929 Other chronic pain: Secondary | ICD-10-CM

## 2024-04-06 DIAGNOSIS — E440 Moderate protein-calorie malnutrition: Secondary | ICD-10-CM | POA: Diagnosis not present

## 2024-04-06 DIAGNOSIS — Z7982 Long term (current) use of aspirin: Secondary | ICD-10-CM | POA: Diagnosis not present

## 2024-04-06 MED ORDER — OXYCODONE HCL 20 MG PO TABS
20.0000 mg | ORAL_TABLET | ORAL | 0 refills | Status: DC | PRN
  Filled ????-??-??: fill #0

## 2024-04-06 MED ORDER — DIPHENOXYLATE-ATROPINE 2.5-0.025 MG PO TABS
1.0000 | ORAL_TABLET | Freq: Four times a day (QID) | ORAL | 0 refills | Status: DC | PRN
  Filled 2024-04-06: qty 60, 15d supply, fill #0

## 2024-04-06 MED ORDER — METHADONE HCL 10 MG PO TABS
10.0000 mg | ORAL_TABLET | Freq: Three times a day (TID) | ORAL | 0 refills | Status: DC
  Filled ????-??-?? (×2): fill #0

## 2024-04-06 MED ORDER — OXYCODONE HCL 20 MG PO TABS
20.0000 mg | ORAL_TABLET | ORAL | 0 refills | Status: DC | PRN
  Filled 2024-04-07 – 2024-04-24 (×5): qty 90, 15d supply, fill #0
  Filled ????-??-??: fill #0

## 2024-04-07 ENCOUNTER — Other Ambulatory Visit: Payer: Self-pay | Admitting: Nurse Practitioner

## 2024-04-07 ENCOUNTER — Other Ambulatory Visit (HOSPITAL_COMMUNITY): Payer: Self-pay

## 2024-04-07 ENCOUNTER — Ambulatory Visit: Admitting: Family Medicine

## 2024-04-07 ENCOUNTER — Telehealth: Payer: Self-pay

## 2024-04-07 DIAGNOSIS — E440 Moderate protein-calorie malnutrition: Secondary | ICD-10-CM | POA: Diagnosis not present

## 2024-04-07 DIAGNOSIS — M8008XD Age-related osteoporosis with current pathological fracture, vertebra(e), subsequent encounter for fracture with routine healing: Secondary | ICD-10-CM | POA: Diagnosis not present

## 2024-04-07 DIAGNOSIS — Z7982 Long term (current) use of aspirin: Secondary | ICD-10-CM | POA: Diagnosis not present

## 2024-04-07 DIAGNOSIS — M5489 Other dorsalgia: Secondary | ICD-10-CM

## 2024-04-07 DIAGNOSIS — M4726 Other spondylosis with radiculopathy, lumbar region: Secondary | ICD-10-CM | POA: Diagnosis not present

## 2024-04-07 DIAGNOSIS — Z7951 Long term (current) use of inhaled steroids: Secondary | ICD-10-CM | POA: Diagnosis not present

## 2024-04-07 DIAGNOSIS — M47814 Spondylosis without myelopathy or radiculopathy, thoracic region: Secondary | ICD-10-CM | POA: Diagnosis not present

## 2024-04-07 DIAGNOSIS — D631 Anemia in chronic kidney disease: Secondary | ICD-10-CM | POA: Diagnosis not present

## 2024-04-07 DIAGNOSIS — C679 Malignant neoplasm of bladder, unspecified: Secondary | ICD-10-CM | POA: Diagnosis not present

## 2024-04-07 DIAGNOSIS — E782 Mixed hyperlipidemia: Secondary | ICD-10-CM | POA: Diagnosis not present

## 2024-04-07 DIAGNOSIS — D520 Dietary folate deficiency anemia: Secondary | ICD-10-CM | POA: Diagnosis not present

## 2024-04-07 DIAGNOSIS — N1832 Chronic kidney disease, stage 3b: Secondary | ICD-10-CM | POA: Diagnosis not present

## 2024-04-07 DIAGNOSIS — G8929 Other chronic pain: Secondary | ICD-10-CM

## 2024-04-07 DIAGNOSIS — G8220 Paraplegia, unspecified: Secondary | ICD-10-CM | POA: Diagnosis not present

## 2024-04-07 DIAGNOSIS — Z515 Encounter for palliative care: Secondary | ICD-10-CM

## 2024-04-07 DIAGNOSIS — I5042 Chronic combined systolic (congestive) and diastolic (congestive) heart failure: Secondary | ICD-10-CM | POA: Diagnosis not present

## 2024-04-07 DIAGNOSIS — I2510 Atherosclerotic heart disease of native coronary artery without angina pectoris: Secondary | ICD-10-CM | POA: Diagnosis not present

## 2024-04-07 DIAGNOSIS — G893 Neoplasm related pain (acute) (chronic): Secondary | ICD-10-CM

## 2024-04-07 DIAGNOSIS — D630 Anemia in neoplastic disease: Secondary | ICD-10-CM | POA: Diagnosis not present

## 2024-04-07 DIAGNOSIS — F5101 Primary insomnia: Secondary | ICD-10-CM | POA: Diagnosis not present

## 2024-04-07 DIAGNOSIS — J9611 Chronic respiratory failure with hypoxia: Secondary | ICD-10-CM | POA: Diagnosis not present

## 2024-04-07 DIAGNOSIS — Z936 Other artificial openings of urinary tract status: Secondary | ICD-10-CM | POA: Diagnosis not present

## 2024-04-07 DIAGNOSIS — M069 Rheumatoid arthritis, unspecified: Secondary | ICD-10-CM | POA: Diagnosis not present

## 2024-04-07 DIAGNOSIS — I420 Dilated cardiomyopathy: Secondary | ICD-10-CM | POA: Diagnosis not present

## 2024-04-07 DIAGNOSIS — J4489 Other specified chronic obstructive pulmonary disease: Secondary | ICD-10-CM | POA: Diagnosis not present

## 2024-04-07 MED ORDER — METHADONE HCL 10 MG PO TABS
10.0000 mg | ORAL_TABLET | Freq: Three times a day (TID) | ORAL | 0 refills | Status: DC
Start: 2024-04-07 — End: 2024-05-18
  Filled 2024-04-07 (×4): qty 75, 25d supply, fill #0

## 2024-04-07 NOTE — Telephone Encounter (Signed)
 Received call from patient and daughter regarding refills of methadone  and oxycodone . On review patient received a partial fill of 16 tabs of methadone  on 03/30/2024, per [facility] medication should be ready for pick up later today. Also was notified that patient and family picked up a new prescription of oxycodone  sent in by oncologist on 7/28 from [facility], then also picked up an old prescription of oxycodone  on 7/30 written by palliative NP on 03/18/2024 at [facility] in [place], [place]. There was confusion regarding what pharmacy the patient wanted her script sent to and the patient ended up with a month supply of oxycodone . Patient and daughter spoke with this RN and Levon Borer, Palliative, NP, discussed the need to stay with one pharmacy, and the guidelines for signed and documented pain contract. Patient's daughter informed that she should have a second bottle of oxycodone  in the home and pt would not need or be receiving a a refill until later this month or beginning of September, patient's daughter verbalized understanding. No further needs at this time.

## 2024-04-08 NOTE — Progress Notes (Deleted)
 Patient Care Team: Teressa Harrie HERO, FNP as PCP - General (Family Medicine) Inocencio Soyla Lunger, MD as PCP - Electrophysiology (Cardiology) Bernie Lamar PARAS, MD as PCP - Cardiology (Cardiology) Mavis Purchase, MD as Consulting Physician (Neurosurgery) Bernie Lamar PARAS, MD as Consulting Physician (Cardiology) Sheldon Standing, MD as Consulting Physician (General Surgery) Skeet Juliene SAUNDERS, DO as Consulting Physician (Neurology) Delores Lauraine NOVAK, [facility] (Inactive) as Pharmacist (Pharmacist) Saintclair Jasper, MD as Consulting Physician (Gastroenterology) Zehr, Harlene BIRCH, PA-C as Physician Assistant (Gastroenterology) Dennise Hoes, MD as Consulting Physician (Nephrology) Pickenpack-Cousar, Fannie SAILOR, NP as Nurse Practitioner [facility])  Clinic Day:  04/08/2024  Referring physician: Tina Pauletta BROCKS, MD  ASSESSMENT & PLAN:   Assessment & Plan: No problem-specific Assessment & Plan notes found for this encounter.    The patient understands the plans discussed today and is in agreement with them.  She knows to contact our office if she develops concerns prior to her next appointment.  I provided *** minutes of face-to-face time during this encounter and > 50% was spent counseling as documented under my assessment and plan.    Powell FORBES Lessen, NP  [facility] - A DEPT OF [facility] [address] [place] [address] Dept: [phone] Dept Fax: [phone]   No orders of the defined types were placed in this encounter.     CHIEF COMPLAINT:  CC: urothelial carcinoma of bladder   Current Treatment:  Enfortumab and pembrolizumab   INTERVAL HISTORY:  [name] is here today for repeat clinical assessment. She last saw Dr. Tina on 03/19/2024. Dose reduced EV to 1mg /kg due to neuropathy. Only receiving on Day 1 of every 21 day cycle. She denies fevers or chills. She denies pain. Her appetite is good. Her  weight {Weight change:10426}.  I have reviewed the past medical history, past surgical history, social history and family history with the patient and they are unchanged from previous note.  ALLERGIES:  is allergic to lyrica  [pregabalin ].  MEDICATIONS:  Current Outpatient Medications  Medication Sig Dispense Refill   ARIPiprazole  (ABILIFY ) 5 MG tablet TAKE 1 TABLET BY MOUTH DAILY 30 tablet 10   aspirin  81 MG tablet Take 81 mg by mouth daily.     BREO ELLIPTA  100-25 MCG/ACT AEPB INHALE 1 PUFF INTO THE LUNGS DAILY. (Patient taking differently: Inhale 1 puff into the lungs daily as needed (SOB).) 60 each 10   Carboxymethylcell-Glycerin  PF 0.5-0.9 % SOLN Place 2 drops into both eyes 4 (four) times daily. 1 each 11   carvedilol  (COREG ) 3.125 MG tablet TAKE ONE (1) TABLET BY MOUTH TWICE DAILY WITH MEALS (Patient taking differently: Take 3.125 mg by mouth 2 (two) times daily with a meal.) 60 tablet 10   denosumab  (PROLIA ) 60 MG/ML SOSY injection Inject 60 mg into the skin every 6 (six) months. 180 mL 2   dextromethorphan  15 MG/5ML syrup Take 10 mLs (30 mg total) by mouth 4 (four) times daily as needed for cough. 120 mL 0   diclofenac  Sodium (VOLTAREN ) 1 % GEL Apply 2 g topically 4 (four) times daily. (Patient taking differently: Apply 2 g topically as needed (Pain).) 50 g 4   diphenoxylate -atropine  (LOMOTIL ) 2.5-0.025 MG tablet Take 1 tablet by mouth 4 (four) times daily as needed for diarrhea or loose stools. 60 tablet 0   dronabinol  (MARINOL ) 5 MG capsule Take 1 capsule (5 mg total) by mouth 2 (two) times daily before lunch and supper.  60 capsule 0   ENTRESTO  24-26 MG TAKE ONE (1) TABLET BY MOUTH TWICE DAILY (Patient taking differently: Take 1 tablet by mouth 2 (two) times daily.) 60 tablet 10   EPINEPHRINE  0.3 mg/0.3 mL IJ SOAJ injection Inject 0.3 mLs (0.3 mg total) into the muscle as needed for anaphylaxis. 1 each 2   esomeprazole  (NEXIUM ) 20 MG capsule TAKE ONE (1) CAPSULE BY MOUTH ONCE DAILY 90  capsule 3   FARXIGA 5 MG TABS tablet Take 5 mg by mouth daily.     ferrous sulfate  325 (65 FE) MG tablet Take 1 tablet (325 mg total) by mouth at bedtime.     fluticasone  (FLONASE ) 50 MCG/ACT nasal spray USE 2 SPRAYS IN EACH NOSTRILS DAILY AS NEEDED (Patient taking differently: Place 2 sprays into both nostrils daily as needed for allergies or rhinitis.) 48 g 0   folic acid  (FOLVITE ) 1 MG tablet Take 1 tablet (1 mg total) by mouth daily.     furosemide  (LASIX ) 20 MG tablet Take 40 mg by mouth daily.     ipratropium-albuterol  (DUONEB) 0.5-2.5 (3) MG/3ML SOLN USE 1 VIAL VIA NEBULIZER EVERY 6 HOURS AS NEEDED FOR SHORTNESS OF BREATH (Patient taking differently: Take 3 mLs by nebulization every 6 (six) hours as needed (SOB).) 90 mL 0   levocetirizine (XYZAL) 5 MG tablet Take 5 mg by mouth daily as needed for allergies.     lidocaine  (LIDODERM ) 5 % Place 1 patch onto the skin daily. Remove & Discard patch within 12 hours or as directed by MD     lidocaine -prilocaine  (EMLA ) cream Apply to affected area once (Patient taking differently: Apply 1 Application topically once. Apply to affected area once) 30 g 3   lubiprostone  (AMITIZA ) 24 MCG capsule TAKE 1 CAPSULE(24 MCG) BY MOUTH TWICE DAILY WITH A MEAL (Patient taking differently: Take 24 mcg by mouth 2 (two) times daily with a meal.) 180 capsule 1   methadone  (DOLOPHINE ) 10 MG tablet Take 1 tablet (10 mg total) by mouth every 8 (eight) hours. 75 tablet 0   methylPREDNISolone  (MEDROL  DOSEPAK) 4 MG TBPK tablet If significant rash, will start at 6 tabs on day 1, then 5 tabs on day 2, then 4 tabs on day 3, then 3 tabs on day 4, then 2 tabs on day 5 and then 1 tab on day 6. Take with food. 21 tablet 0   metoCLOPramide  (REGLAN ) 10 MG tablet Take 1 tablet (10 mg total) by mouth every 8 (eight) hours as needed for nausea. 45 tablet 1   Multiple Vitamin (MULTIVITAMIN WITH MINERALS) TABS tablet Take 1 tablet by mouth daily.     naloxone  (NARCAN ) nasal spray 4 mg/0.1 mL  Place 1 spray into the nose once.     nitroGLYCERIN  (NITROSTAT ) 0.4 MG SL tablet Place 1 tablet (0.4 mg total) under the tongue every 5 (five) minutes as needed for chest pain. 25 tablet 6   ondansetron  (ZOFRAN ) 4 MG tablet Take 1 tablet (4 mg total) by mouth every 8 (eight) hours as needed for nausea or vomiting. 20 tablet 0   ondansetron  (ZOFRAN ) 8 MG tablet Take 1 tablet (8 mg total) by mouth every 8 (eight) hours as needed for nausea or vomiting. 30 tablet 1   Oxycodone  HCl 20 MG TABS Take 1 tablet (20 mg total) by mouth every 4 (four) hours as needed. 90 tablet 0   potassium chloride  (KLOR-CON  M) 10 MEQ tablet Take 2 tablets (20 mEq total) by mouth 2 (two) times daily.  180 tablet 1   prochlorperazine  (COMPAZINE ) 10 MG tablet Take 1 tablet (10 mg total) by mouth every 6 (six) hours as needed for nausea or vomiting. 30 tablet 3   ranolazine  (RANEXA ) 500 MG 12 hr tablet TAKE 1 TABLET BY MOUTH TWICE DAILY 60 tablet 10   rosuvastatin  (CRESTOR ) 20 MG tablet TAKE 1 TABLET BY MOUTH ONCE DAILY 90 tablet 1   sertraline  (ZOLOFT ) 100 MG tablet TAKE 1/2 TABLET(50 MG) BY MOUTH DAILY (Patient taking differently: Take 50 mg by mouth daily.) 45 tablet 1   sulfamethoxazole -trimethoprim  (BACTRIM  DS) 800-160 MG tablet Take 1 tablet by mouth 2 (two) times daily. 14 tablet 0   tiZANidine  (ZANAFLEX ) 4 MG tablet Take 1 tablet (4 mg total) by mouth at bedtime. 30 tablet 3   topiramate  (TOPAMAX ) 50 MG tablet TAKE TWO (2) TABLETS BY MOUTH EVERY DAY AT BEDTIME (Patient taking differently: Take 50 mg by mouth at bedtime. TAKE TWO (2) TABLETS BY MOUTH EVERY DAY AT BEDTIME) 60 tablet 10   Ubrogepant  (UBRELVY ) 100 MG TABS Take 1 tablet (100 mg total) by mouth as needed (May repeat after 2 hours.  Maximum 2 tablets in 24 hours). TAKE 1 TABLET BY MOUTH BY MOUTH AS NEEDED( MAY REPEAT 1 TABLET AFTER 2 HOURS IF NEEDED, MAXIMUM 2 TABLETS IN 24 HOURS) Strength: 100 mg 48 tablet 1   Vitamin D , Ergocalciferol , (DRISDOL ) 1.25 MG (50000  UNIT) CAPS capsule TAKE 1 CAPSULE BY MOUTH EVERY 7 DAYS FOR 12 DOSES (Patient taking differently: Take 50,000 Units by mouth every 7 (seven) days. Patient usually takes this medication on mondays) 12 capsule 0   No current facility-administered medications for this visit.    HISTORY OF PRESENT ILLNESS:   Oncology History  Urothelial carcinoma of bladder (HCC)  11/18/2023 Initial Diagnosis   Urothelial carcinoma of bladder (HCC)   12/01/2023 Cancer Staging   Staging form: Urinary Bladder, AJCC 8th Edition - Clinical: Stage IVB (cTX, cNX, pM1b) - Signed by Tina Pauletta BROCKS, MD on 12/01/2023 WHO/ISUP grade (low/high): High Grade Histologic grading system: 2 grade system   12/18/2023 -  Chemotherapy   12/18/23 C1D1 01/08/24 C2D1 01/29/24 C3D1. Pembro only. Day 8 reduced EV to 1mg /kg Patient is on Treatment Plan : UROTHELIAL ADVANCED, METASTATIC ENFORTUMAB D1, D8 + PEMBROLIZUMAB  (200) D1 Q21D      02/12/2024 PET scan   PET 1. Response to therapy of osseous metastasis. 2. The pelvis is again poorly evaluated. Soft tissue density within the deep pelvis is less distinct today with decreased hypermetabolism. Potential etiologies again include response to therapy of a synchronous uterine primary versus isolated metastasis (presuming the patient is status post hysterectomy). 3. Placement of a right ureteric stent with similar mild right-sided hydronephrosis. The left-sided hydroureteronephrosis has resolved. 4.  Aortic Atherosclerosis (ICD10-I70.0).       REVIEW OF SYSTEMS:   Constitutional: Denies fevers, chills or abnormal weight loss Eyes: Denies blurriness of vision Ears, nose, mouth, throat, and face: Denies mucositis or sore throat Respiratory: Denies cough, dyspnea or wheezes Cardiovascular: Denies palpitation, chest discomfort or lower extremity swelling Gastrointestinal:  Denies nausea, heartburn or change in bowel habits Skin: Denies abnormal skin rashes Lymphatics: Denies new  lymphadenopathy or easy bruising Neurological:Denies numbness, tingling or new weaknesses Behavioral/Psych: Mood is stable, no new changes  All other systems were reviewed with the patient and are negative.   VITALS:  There were no vitals taken for this visit.  Wt Readings from Last 3 Encounters:  03/19/24 132 lb (59.9  kg)  02/25/24 147 lb 0.8 oz (66.7 kg)  02/05/24 154 lb 15.7 oz (70.3 kg)    There is no height or weight on file to calculate BMI.  Performance status (ECOG): {CHL ONC [id number]  PHYSICAL EXAM:   GENERAL:alert, no distress and comfortable SKIN: skin color, texture, turgor are normal, no rashes or significant lesions EYES: normal, Conjunctiva are pink and non-injected, sclera clear OROPHARYNX:no exudate, no erythema and lips, buccal mucosa, and tongue normal  NECK: supple, thyroid  normal size, non-tender, without nodularity LYMPH:  no palpable lymphadenopathy in the cervical, axillary or inguinal LUNGS: clear to auscultation and percussion with normal breathing effort HEART: regular rate & rhythm and no murmurs and no lower extremity edema ABDOMEN:abdomen soft, non-tender and normal bowel sounds Musculoskeletal:no cyanosis of digits and no clubbing  NEURO: alert & oriented x 3 with fluent speech, no focal motor/sensory deficits  LABORATORY DATA:  I have reviewed the data as listed    Component Value Date/Time   NA 137 03/19/2024 1152   NA 139 09/25/2023 1449   K 3.3 (L) 03/19/2024 1152   CL 106 03/19/2024 1152   CO2 25 03/19/2024 1152   GLUCOSE 109 (H) 03/19/2024 1152   BUN 19 03/19/2024 1152   BUN 32 (H) 09/25/2023 1449   CREATININE 1.30 (H) 03/19/2024 1152   CALCIUM  8.5 (L) 03/19/2024 1152   PROT 5.8 (L) 03/19/2024 1152   PROT 6.5 09/25/2023 1449   ALBUMIN  2.5 (L) 03/19/2024 1152   ALBUMIN  4.3 09/25/2023 1449   AST 14 (L) 03/19/2024 1152   ALT 7 03/19/2024 1152   ALKPHOS 71 03/19/2024 1152   BILITOT 0.3 03/19/2024 1152   GFRNONAA 45 (L)  03/19/2024 1152   GFRAA 57 (L) 09/05/2020 1138    No results found for: SPEP, UPEP  Lab Results  Component Value Date   WBC 9.9 03/19/2024   NEUTROABS 6.4 03/19/2024   HGB 8.6 (L) 03/19/2024   HCT 26.2 (L) 03/19/2024   MCV 91.0 03/19/2024   PLT 280 03/19/2024      Chemistry      Component Value Date/Time   NA 137 03/19/2024 1152   NA 139 09/25/2023 1449   K 3.3 (L) 03/19/2024 1152   CL 106 03/19/2024 1152   CO2 25 03/19/2024 1152   BUN 19 03/19/2024 1152   BUN 32 (H) 09/25/2023 1449   CREATININE 1.30 (H) 03/19/2024 1152      Component Value Date/Time   CALCIUM  8.5 (L) 03/19/2024 1152   ALKPHOS 71 03/19/2024 1152   AST 14 (L) 03/19/2024 1152   ALT 7 03/19/2024 1152   BILITOT 0.3 03/19/2024 1152       RADIOGRAPHIC STUDIES: I have personally reviewed the radiological images as listed and agreed with the findings in the report. No results found.

## 2024-04-09 ENCOUNTER — Telehealth: Payer: Self-pay

## 2024-04-09 ENCOUNTER — Inpatient Hospital Stay: Admitting: Dietician

## 2024-04-09 ENCOUNTER — Inpatient Hospital Stay: Admitting: Nurse Practitioner

## 2024-04-09 ENCOUNTER — Other Ambulatory Visit (HOSPITAL_COMMUNITY): Payer: Self-pay

## 2024-04-09 ENCOUNTER — Inpatient Hospital Stay

## 2024-04-09 NOTE — Telephone Encounter (Signed)
 S/w pt's daughter regarding concerns about filling her mother's methadone  and oxycodone . Pt will run out today and her daughter is asking for the rx that was sent in. [facility] filled the Oxycodone  from 7/28 despite it being discontinued. Advised [facility] to put the oxycodone  that was sent in 8/13 on hold until 8/30 since pt's husband picked up the oxycodone  at [facility] today. Moving forward, pt will have both oxycodone  AND Methadone  filled at [facility]. Pt's daughter is aware and verbalized thanks.

## 2024-04-09 NOTE — Telephone Encounter (Signed)
 Called this patient's daughter back per her request but unable to leave a message due to voicemail being full. [name]'s daugher, [name], had some questions that she wanted to ask. Will send a mychart message to see if we can address. Andrea CHRISTELLA Plunk, RN

## 2024-04-11 DIAGNOSIS — J449 Chronic obstructive pulmonary disease, unspecified: Secondary | ICD-10-CM | POA: Diagnosis not present

## 2024-04-13 DIAGNOSIS — N261 Atrophy of kidney (terminal): Secondary | ICD-10-CM | POA: Diagnosis not present

## 2024-04-14 DIAGNOSIS — E440 Moderate protein-calorie malnutrition: Secondary | ICD-10-CM | POA: Diagnosis not present

## 2024-04-14 DIAGNOSIS — G8220 Paraplegia, unspecified: Secondary | ICD-10-CM | POA: Diagnosis not present

## 2024-04-14 DIAGNOSIS — M4726 Other spondylosis with radiculopathy, lumbar region: Secondary | ICD-10-CM | POA: Diagnosis not present

## 2024-04-14 DIAGNOSIS — D520 Dietary folate deficiency anemia: Secondary | ICD-10-CM | POA: Diagnosis not present

## 2024-04-14 DIAGNOSIS — Z7951 Long term (current) use of inhaled steroids: Secondary | ICD-10-CM | POA: Diagnosis not present

## 2024-04-14 DIAGNOSIS — Z936 Other artificial openings of urinary tract status: Secondary | ICD-10-CM | POA: Diagnosis not present

## 2024-04-14 DIAGNOSIS — D631 Anemia in chronic kidney disease: Secondary | ICD-10-CM | POA: Diagnosis not present

## 2024-04-14 DIAGNOSIS — I2510 Atherosclerotic heart disease of native coronary artery without angina pectoris: Secondary | ICD-10-CM | POA: Diagnosis not present

## 2024-04-14 DIAGNOSIS — I420 Dilated cardiomyopathy: Secondary | ICD-10-CM | POA: Diagnosis not present

## 2024-04-14 DIAGNOSIS — M8008XD Age-related osteoporosis with current pathological fracture, vertebra(e), subsequent encounter for fracture with routine healing: Secondary | ICD-10-CM | POA: Diagnosis not present

## 2024-04-14 DIAGNOSIS — I5022 Chronic systolic (congestive) heart failure: Secondary | ICD-10-CM | POA: Diagnosis not present

## 2024-04-14 DIAGNOSIS — C679 Malignant neoplasm of bladder, unspecified: Secondary | ICD-10-CM | POA: Diagnosis not present

## 2024-04-14 DIAGNOSIS — E782 Mixed hyperlipidemia: Secondary | ICD-10-CM | POA: Diagnosis not present

## 2024-04-14 DIAGNOSIS — F5101 Primary insomnia: Secondary | ICD-10-CM | POA: Diagnosis not present

## 2024-04-14 DIAGNOSIS — J9611 Chronic respiratory failure with hypoxia: Secondary | ICD-10-CM | POA: Diagnosis not present

## 2024-04-14 DIAGNOSIS — I5042 Chronic combined systolic (congestive) and diastolic (congestive) heart failure: Secondary | ICD-10-CM | POA: Diagnosis not present

## 2024-04-14 DIAGNOSIS — M069 Rheumatoid arthritis, unspecified: Secondary | ICD-10-CM | POA: Diagnosis not present

## 2024-04-14 DIAGNOSIS — D630 Anemia in neoplastic disease: Secondary | ICD-10-CM | POA: Diagnosis not present

## 2024-04-14 DIAGNOSIS — M47814 Spondylosis without myelopathy or radiculopathy, thoracic region: Secondary | ICD-10-CM | POA: Diagnosis not present

## 2024-04-14 DIAGNOSIS — Z7982 Long term (current) use of aspirin: Secondary | ICD-10-CM | POA: Diagnosis not present

## 2024-04-14 DIAGNOSIS — N1832 Chronic kidney disease, stage 3b: Secondary | ICD-10-CM | POA: Diagnosis not present

## 2024-04-14 DIAGNOSIS — J4489 Other specified chronic obstructive pulmonary disease: Secondary | ICD-10-CM | POA: Diagnosis not present

## 2024-04-15 ENCOUNTER — Telehealth: Payer: Self-pay | Admitting: Nurse Practitioner

## 2024-04-15 NOTE — Telephone Encounter (Signed)
 Scheduled appointment per staff message. Talked with the patient and she is aware of the made appointment.

## 2024-04-16 ENCOUNTER — Telehealth: Payer: Self-pay

## 2024-04-16 ENCOUNTER — Other Ambulatory Visit

## 2024-04-16 ENCOUNTER — Ambulatory Visit

## 2024-04-16 ENCOUNTER — Other Ambulatory Visit: Payer: Self-pay

## 2024-04-16 NOTE — Telephone Encounter (Signed)
 Spoke with [name] who is the best point of contact for Haruko. [name] is her daughter. Her mother could not remember when she should come back in and now is scheduled at . Reviewed the appointments with [name]. She stated she is not sure if she will know how to do the mychart video visit and will ask for assistance at [facility]. If she cannot figure it out, we will have Dr. Tina call her instead as a phone visit. He next infusion will be on 8/26 at 1 pm and her future treatment plan dates will need to be adjusted as well. Andrea CHRISTELLA Plunk, RN

## 2024-04-17 ENCOUNTER — Other Ambulatory Visit (HOSPITAL_COMMUNITY): Payer: Self-pay

## 2024-04-17 ENCOUNTER — Other Ambulatory Visit: Payer: Self-pay

## 2024-04-17 NOTE — Progress Notes (Signed)
 Patient missed appointment.  Rescheduled to 8/26 at [facility].  Treatment date adjusted.  Will need to adjust cycle 7 to 9/16.

## 2024-04-18 DIAGNOSIS — D520 Dietary folate deficiency anemia: Secondary | ICD-10-CM | POA: Diagnosis not present

## 2024-04-18 DIAGNOSIS — F5101 Primary insomnia: Secondary | ICD-10-CM | POA: Diagnosis not present

## 2024-04-18 DIAGNOSIS — I2510 Atherosclerotic heart disease of native coronary artery without angina pectoris: Secondary | ICD-10-CM | POA: Diagnosis not present

## 2024-04-18 DIAGNOSIS — M8008XD Age-related osteoporosis with current pathological fracture, vertebra(e), subsequent encounter for fracture with routine healing: Secondary | ICD-10-CM | POA: Diagnosis not present

## 2024-04-18 DIAGNOSIS — D630 Anemia in neoplastic disease: Secondary | ICD-10-CM | POA: Diagnosis not present

## 2024-04-18 DIAGNOSIS — E782 Mixed hyperlipidemia: Secondary | ICD-10-CM | POA: Diagnosis not present

## 2024-04-18 DIAGNOSIS — I420 Dilated cardiomyopathy: Secondary | ICD-10-CM | POA: Diagnosis not present

## 2024-04-18 DIAGNOSIS — M4726 Other spondylosis with radiculopathy, lumbar region: Secondary | ICD-10-CM | POA: Diagnosis not present

## 2024-04-18 DIAGNOSIS — M069 Rheumatoid arthritis, unspecified: Secondary | ICD-10-CM | POA: Diagnosis not present

## 2024-04-18 DIAGNOSIS — Z936 Other artificial openings of urinary tract status: Secondary | ICD-10-CM | POA: Diagnosis not present

## 2024-04-18 DIAGNOSIS — E440 Moderate protein-calorie malnutrition: Secondary | ICD-10-CM | POA: Diagnosis not present

## 2024-04-18 DIAGNOSIS — D631 Anemia in chronic kidney disease: Secondary | ICD-10-CM | POA: Diagnosis not present

## 2024-04-18 DIAGNOSIS — J9611 Chronic respiratory failure with hypoxia: Secondary | ICD-10-CM | POA: Diagnosis not present

## 2024-04-18 DIAGNOSIS — J4489 Other specified chronic obstructive pulmonary disease: Secondary | ICD-10-CM | POA: Diagnosis not present

## 2024-04-18 DIAGNOSIS — I5042 Chronic combined systolic (congestive) and diastolic (congestive) heart failure: Secondary | ICD-10-CM | POA: Diagnosis not present

## 2024-04-18 DIAGNOSIS — Z7982 Long term (current) use of aspirin: Secondary | ICD-10-CM | POA: Diagnosis not present

## 2024-04-18 DIAGNOSIS — Z7951 Long term (current) use of inhaled steroids: Secondary | ICD-10-CM | POA: Diagnosis not present

## 2024-04-18 DIAGNOSIS — G8220 Paraplegia, unspecified: Secondary | ICD-10-CM | POA: Diagnosis not present

## 2024-04-18 DIAGNOSIS — C679 Malignant neoplasm of bladder, unspecified: Secondary | ICD-10-CM | POA: Diagnosis not present

## 2024-04-18 DIAGNOSIS — N1832 Chronic kidney disease, stage 3b: Secondary | ICD-10-CM | POA: Diagnosis not present

## 2024-04-18 DIAGNOSIS — M47814 Spondylosis without myelopathy or radiculopathy, thoracic region: Secondary | ICD-10-CM | POA: Diagnosis not present

## 2024-04-19 ENCOUNTER — Inpatient Hospital Stay

## 2024-04-19 ENCOUNTER — Telehealth: Payer: Self-pay | Admitting: Family Medicine

## 2024-04-19 DIAGNOSIS — D520 Dietary folate deficiency anemia: Secondary | ICD-10-CM | POA: Diagnosis not present

## 2024-04-19 DIAGNOSIS — C679 Malignant neoplasm of bladder, unspecified: Secondary | ICD-10-CM | POA: Insufficient documentation

## 2024-04-19 DIAGNOSIS — Z5112 Encounter for antineoplastic immunotherapy: Secondary | ICD-10-CM | POA: Insufficient documentation

## 2024-04-19 DIAGNOSIS — D638 Anemia in other chronic diseases classified elsewhere: Secondary | ICD-10-CM | POA: Diagnosis not present

## 2024-04-19 DIAGNOSIS — Z79620 Long term (current) use of immunosuppressive biologic: Secondary | ICD-10-CM | POA: Insufficient documentation

## 2024-04-19 DIAGNOSIS — G629 Polyneuropathy, unspecified: Secondary | ICD-10-CM | POA: Diagnosis not present

## 2024-04-19 DIAGNOSIS — R7989 Other specified abnormal findings of blood chemistry: Secondary | ICD-10-CM

## 2024-04-19 DIAGNOSIS — C7951 Secondary malignant neoplasm of bone: Secondary | ICD-10-CM | POA: Insufficient documentation

## 2024-04-19 LAB — CMP (CANCER CENTER ONLY)
ALT: 10 U/L (ref 0–44)
AST: 20 U/L (ref 15–41)
Albumin: 2.9 g/dL — ABNORMAL LOW (ref 3.5–5.0)
Alkaline Phosphatase: 71 U/L (ref 38–126)
Anion gap: 11 (ref 5–15)
BUN: 21 mg/dL (ref 8–23)
CO2: 23 mmol/L (ref 22–32)
Calcium: 9.3 mg/dL (ref 8.9–10.3)
Chloride: 107 mmol/L (ref 98–111)
Creatinine: 1.15 mg/dL — ABNORMAL HIGH (ref 0.44–1.00)
GFR, Estimated: 51 mL/min — ABNORMAL LOW (ref >60)
Glucose, Bld: 95 mg/dL (ref 70–99)
Potassium: 3.8 mmol/L (ref 3.5–5.1)
Sodium: 141 mmol/L (ref 135–145)
Total Bilirubin: 0.2 mg/dL (ref 0.0–1.2)
Total Protein: 6.3 g/dL — ABNORMAL LOW (ref 6.5–8.1)

## 2024-04-19 LAB — CBC WITH DIFFERENTIAL (CANCER CENTER ONLY)
Abs Immature Granulocytes: 0.04 K/uL (ref 0.00–0.07)
Basophils Absolute: 0.1 K/uL (ref 0.0–0.1)
Basophils Relative: 1 %
Eosinophils Absolute: 1.4 K/uL — ABNORMAL HIGH (ref 0.0–0.5)
Eosinophils Relative: 15 %
HCT: 31.1 % — ABNORMAL LOW (ref 36.0–46.0)
Hemoglobin: 9.8 g/dL — ABNORMAL LOW (ref 12.0–15.0)
Immature Granulocytes: 0 %
Lymphocytes Relative: 19 %
Lymphs Abs: 1.7 K/uL (ref 0.7–4.0)
MCH: 28.9 pg (ref 26.0–34.0)
MCHC: 31.5 g/dL (ref 30.0–36.0)
MCV: 91.7 fL (ref 80.0–100.0)
Monocytes Absolute: 0.7 K/uL (ref 0.1–1.0)
Monocytes Relative: 8 %
Neutro Abs: 5.3 K/uL (ref 1.7–7.7)
Neutrophils Relative %: 57 %
Platelet Count: 277 K/uL (ref 150–400)
RBC: 3.39 MIL/uL — ABNORMAL LOW (ref 3.87–5.11)
RDW: 15.2 % (ref 11.5–15.5)
WBC Count: 9.3 K/uL (ref 4.0–10.5)
nRBC: 0 % (ref 0.0–0.2)

## 2024-04-19 LAB — T4, FREE: Free T4: 1.05 ng/dL (ref 0.61–1.12)

## 2024-04-19 LAB — MAGNESIUM: Magnesium: 2 mg/dL (ref 1.7–2.4)

## 2024-04-19 NOTE — Assessment & Plan Note (Addendum)
 B12 1000 mcg daily

## 2024-04-19 NOTE — Assessment & Plan Note (Addendum)
 Will see the dentist for evaluation before starting bisphosphonate Recommend vitamin D  and calcium  daily

## 2024-04-19 NOTE — Telephone Encounter (Signed)
 US  [facility] CMN and ONF Order

## 2024-04-19 NOTE — Assessment & Plan Note (Addendum)
Folic acid 1mg daily

## 2024-04-19 NOTE — Assessment & Plan Note (Addendum)
 Reduced dose of EV to 1mg /kg Will try to add day 8 back as tolerated Seeing palliative care as well for chronic pain management Continue EVP as tolerated Follow up with next cycle.

## 2024-04-19 NOTE — Progress Notes (Signed)
 [facility] OFFICE PROGRESS NOTE  Patient Care Team: Teressa Harrie HERO, FNP as PCP - General (Family Medicine) Inocencio Soyla Lunger, MD as PCP - Electrophysiology (Cardiology) Bernie Lamar PARAS, MD as PCP - Cardiology (Cardiology) Mavis Purchase, MD as Consulting Physician (Neurosurgery) Bernie Lamar PARAS, MD as Consulting Physician (Cardiology) Sheldon Standing, MD as Consulting Physician (General Surgery) Skeet Juliene SAUNDERS, DO as Consulting Physician (Neurology) Delores Lauraine NOVAK, [facility] (Inactive) as Pharmacist (Pharmacist) Saintclair Jasper, MD as Consulting Physician (Gastroenterology) Zehr, Harlene BIRCH, PA-C as Physician Assistant (Gastroenterology) Dennise Hoes, MD as Consulting Physician (Nephrology) Pickenpack-Cousar, Fannie SAILOR, NP as Nurse Practitioner [facility])  Visit type: Video visit Patient location: in the car in [place]  Patient location: [facility]  68 y.o.female with past medical history of COPD, chronic back from from DDD, osteoporosis with fractures presenting with back pain radiating down to the hip in March of 2025 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  Cystoscopy with TURBT show right sided bladder tumor with palpable anterior vaginal mass.  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    Current diagnosis: Stage IVB UC with bone metastases Treatment:  12/18/23 C1D1 EV/P  Interval response to treatment after C3 from 6/19 PET/CT.  She was able to take a trip to the beach.  We omitted day 8 to improve quality of life.  Received cycle 5 on 03/19/2024. 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 Report different from before.  She has delayed this current cycle.  She is ready to get back to treatment.  We will plan on treatment tomorrow.  Clinically, report of improved performance status. Assessment & Plan Urothelial carcinoma of bladder (HCC) Reduced dose of EV  to 1mg /kg Will try to add day 8 back as tolerated Seeing palliative care as well for chronic pain management Continue EVP as tolerated Follow up with next cycle. Anemia of chronic disease Improved.  Low vitamin B12 level B12 1000 mcg daily Dietary folate deficiency anemia Folic acid  1 mg daily Neuropathy Left feet. Continue monitor. Cancer, metastatic to bone [facility]) Will see the dentist for evaluation before starting bisphosphonate Recommend vitamin D  and calcium  daily  Orders Placed This Encounter  Procedures   CBC with Differential (Cancer Center Only)    Standing Status:   Future    Expected Date:   04/28/2024    Expiration Date:   04/28/2025   CMP (Cancer Center only)    Standing Status:   Future    Expected Date:   04/28/2024    Expiration Date:   04/28/2025     [name], MD  INTERVAL HISTORY: [name] returns for treatment follow-up.  Swelling in the leg but not as bad lately. She is taking lasix  40 mg daily. She takes potassium as well. No fever, coughing, short of breath. No new bone pain or other new symptoms.  Appetite is better and eating better.  Daughter noted she has more energy.   Oncology History  Urothelial carcinoma of bladder (HCC)  11/18/2023 Initial Diagnosis   Urothelial carcinoma of bladder (HCC)   12/01/2023 Cancer Staging   Staging form: Urinary Bladder, AJCC 8th Edition - Clinical: Stage IVB (cTX, cNX, pM1b) - Signed by Chihuahua Pauletta JAYSON, MD on 12/01/2023 WHO/ISUP grade (low/high): High Grade Histologic grading system: 2 grade system   12/18/2023 -  Chemotherapy   12/18/23 C1D1 01/08/24 C2D1 01/29/24 C3D1. Pembro only. Day 8 reduced EV to 1mg /kg Patient is  on Treatment Plan : UROTHELIAL ADVANCED, METASTATIC ENFORTUMAB D1, D8 + PEMBROLIZUMAB  (200) D1 Q21D      02/12/2024 PET scan   PET 1. Response to therapy of osseous metastasis. 2. The pelvis is again poorly evaluated. Soft tissue density within the deep pelvis is less distinct today with  decreased hypermetabolism. Potential etiologies again include response to therapy of a synchronous uterine primary versus isolated metastasis (presuming the patient is status post hysterectomy). 3. Placement of a right ureteric stent with similar mild right-sided hydronephrosis. The left-sided hydroureteronephrosis has resolved. 4.  Aortic Atherosclerosis (ICD10-I70.0).      PHYSICAL EXAMINATION: ECOG PERFORMANCE STATUS: 2 - Symptomatic, <50% confined to bed  In the car. No distress.  Relevant data reviewed during this visit included labs.

## 2024-04-19 NOTE — Assessment & Plan Note (Addendum)
 Improved.

## 2024-04-19 NOTE — Assessment & Plan Note (Addendum)
 Left feet. Continue monitor.

## 2024-04-20 ENCOUNTER — Inpatient Hospital Stay

## 2024-04-20 ENCOUNTER — Telehealth: Payer: Self-pay

## 2024-04-20 ENCOUNTER — Other Ambulatory Visit: Payer: Self-pay

## 2024-04-20 VITALS — BP 94/69 | HR 88 | Temp 98.0°F | Resp 18 | Ht <= 58 in | Wt 135.2 lb

## 2024-04-20 DIAGNOSIS — Z5112 Encounter for antineoplastic immunotherapy: Secondary | ICD-10-CM | POA: Diagnosis not present

## 2024-04-20 DIAGNOSIS — C679 Malignant neoplasm of bladder, unspecified: Secondary | ICD-10-CM | POA: Diagnosis not present

## 2024-04-20 DIAGNOSIS — C7951 Secondary malignant neoplasm of bone: Secondary | ICD-10-CM | POA: Diagnosis not present

## 2024-04-20 DIAGNOSIS — Z79620 Long term (current) use of immunosuppressive biologic: Secondary | ICD-10-CM | POA: Diagnosis not present

## 2024-04-20 MED ORDER — HEPARIN SOD (PORK) LOCK FLUSH 100 UNIT/ML IV SOLN: 500.0000 [IU] | Freq: Once | INTRAVENOUS | Status: DC | PRN

## 2024-04-20 MED ORDER — SODIUM CHLORIDE 0.9 % IV SOLN
200.0000 mg | Freq: Once | INTRAVENOUS | Status: AC
  Administered 2024-04-20: 200 mg via INTRAVENOUS
  Filled 2024-04-20: qty 200

## 2024-04-20 MED ORDER — SODIUM CHLORIDE 0.9% FLUSH: 10.0000 mL | INTRAVENOUS | Status: DC | PRN

## 2024-04-20 MED ORDER — SODIUM CHLORIDE 0.9 % IV SOLN
1.0000 mg/kg | Freq: Once | INTRAVENOUS | Status: AC
  Administered 2024-04-20: 70 mg via INTRAVENOUS
  Filled 2024-04-20: qty 2.4

## 2024-04-20 MED ORDER — SODIUM CHLORIDE 0.9 % IV SOLN: INTRAVENOUS | Status: DC

## 2024-04-20 MED ORDER — PROCHLORPERAZINE MALEATE 10 MG PO TABS
10.0000 mg | ORAL_TABLET | Freq: Once | ORAL | Status: AC
  Administered 2024-04-20: 10 mg via ORAL
  Filled 2024-04-20: qty 1

## 2024-04-20 NOTE — Telephone Encounter (Signed)
 Scheduled appointment with the patient daughter.

## 2024-04-20 NOTE — Patient Instructions (Signed)
 Enfortumab Vedotin  Injection What is this medication? ENFORTUMAB VEDOTIN  (en FORT ue mab ve DOE tin) treats bladder cancer and kidney cancer. It works by blocking a protein that causes cancer cells to grow and multiply. This helps to slow or stop the spread of cancer cells. This medicine may be used for other purposes; ask your health care provider or pharmacist if you have questions. COMMON BRAND NAME(S): PADCEV  What should I tell my care team before I take this medication? They need to know if you have any of these conditions: Diabetes Eye disease Liver disease Lung disease Tingling of the fingers or toes or other nerve disorder Vision problems An unusual or allergic reaction to enfortumab vedotin , other medications, foods, dyes, or preservatives Pregnant or trying to get pregnant Breast-feeding How should I use this medication? This medication is injected into a vein. It is given by your care team in a hospital or clinic setting. Talk to your care team about the use of this medication in children. Special care may be needed. Overdosage: If you think you have taken too much of this medicine contact a poison control center or emergency room at once. NOTE: This medicine is only for you. Do not share this medicine with others. What if I miss a dose? Keep appointments for follow-up doses. It is important not to miss your dose. Call your care team if you are unable to keep an appointment. What may interact with this medication? This medication may affect how other medications work, and other medications may affect how this medication works. Talk with your care team about all of the medications you take. They may suggest changes to your treatment plan to lower the risk of side effects and to make sure your medications work as intended. This list may not describe all possible interactions. Give your health care provider a list of all the medicines, herbs, non-prescription drugs, or dietary  supplements you use. Also tell them if you smoke, drink alcohol, or use illegal drugs. Some items may interact with your medicine. What should I watch for while using this medication? Your condition will be monitored carefully while you are receiving this medication. This medication may make you feel generally unwell. This is not uncommon as chemotherapy can affect healthy cells as well as cancer cells. Report any side effects. Continue your course of treatment even though you feel ill unless your care team tells you to stop. This medication may increase blood sugar. The risk may be higher in patients who already have diabetes. Ask your care team what you can do to lower your risk of diabetes while taking this medication. This medication can cause a serious condition in which there is too much acid in your blood. If you develop nausea, vomiting, stomach pain, unusual tiredness, or trouble breathing, stop taking this medication and call your care team right away. If possible, use a ketone dipstick to check for ketones in your urine. This medication may cause dry eyes and blurred vision. If you wear contact lenses, you may feel some discomfort. Lubricating eye drops may help. See your care team if the problem does not go away or is severe. Tell your care team right away if you have any change in your eyesight. This medication may increase your risk of getting an infection. Call your care team for advice if you get a fever, chills, sore throat, or other symptoms of a cold or flu. Do not treat yourself. Try to avoid being around people who are  sick. Avoid taking medications that contain aspirin , acetaminophen , ibuprofen , naproxen , or ketoprofen unless instructed by your care team. These medications may hide a fever. This medication may cause serious skin reactions. They can happen weeks to months after starting the medication. Contact your care team right away if you notice fevers or flu-like symptoms with a  rash. The rash may be red or purple and then turn into blisters or peeling of the skin. You may also notice a red rash with swelling of the face, lips or lymph nodes in your neck or under your arms. Talk to your care team if you or your partner may be pregnant. Serious birth defects can occur if you take this medication during pregnancy and for 2 months after the last dose. You will need a negative pregnancy test before starting this medication. Contraception is recommended while taking this medication and for 2 months after the last dose. Your care team can help you find the option that works for you. If your partner can get pregnant, use a condom during sex while taking this medication and for 4 months after the last dose. Do not breastfeed while taking this medicine or for at least 3 weeks after the last dose. This medication may cause infertility. Talk to your care team if you are concerned about your fertility. What side effects may I notice from receiving this medication? Side effects that you should report to your care team as soon as possible: Allergic reactions--skin rash, itching, hives, swelling of the face, lips, tongue, or throat Dry cough, shortness of breath or trouble breathing Eye pain, redness, irritation, or discharge with blurry or decreased vision High blood sugar (hyperglycemia)--increased thirst or amount of urine, unusual weakness or fatigue, blurry vision Painful swelling, warmth, or redness of the skin, blisters or sores at the infusion site Pain, tingling, or numbness in the hands or feet Redness, blistering, peeling, or loosening of the skin, including inside the mouth Unusual bruising or bleeding Side effects that usually do not require medical attention (report these to your care team if they continue or are bothersome): Change in taste Diarrhea Dry eyes Fatigue Hair loss Loss of appetite This list may not describe all possible side effects. Call your doctor for  medical advice about side effects. You may report side effects to FDA at 1-800-FDA-1088. Where should I keep my medication? This medication is given in a hospital or clinic. It will not be stored at home. NOTE: This sheet is a summary. It may not cover all possible information. If you have questions about this medicine, talk to your doctor, pharmacist, or health care provider.  2024 Elsevier/Gold Standard (2021-12-25 00:00:00)

## 2024-04-21 ENCOUNTER — Encounter: Payer: Self-pay | Admitting: Family Medicine

## 2024-04-21 ENCOUNTER — Ambulatory Visit (INDEPENDENT_AMBULATORY_CARE_PROVIDER_SITE_OTHER): Admitting: Family Medicine

## 2024-04-21 VITALS — BP 102/68 | HR 91 | Temp 97.8°F | Resp 16 | Ht <= 58 in | Wt 135.2 lb

## 2024-04-21 DIAGNOSIS — G8220 Paraplegia, unspecified: Secondary | ICD-10-CM | POA: Diagnosis not present

## 2024-04-21 DIAGNOSIS — M069 Rheumatoid arthritis, unspecified: Secondary | ICD-10-CM | POA: Diagnosis not present

## 2024-04-21 DIAGNOSIS — C7951 Secondary malignant neoplasm of bone: Secondary | ICD-10-CM

## 2024-04-21 DIAGNOSIS — J9611 Chronic respiratory failure with hypoxia: Secondary | ICD-10-CM | POA: Diagnosis not present

## 2024-04-21 DIAGNOSIS — J449 Chronic obstructive pulmonary disease, unspecified: Secondary | ICD-10-CM | POA: Diagnosis not present

## 2024-04-21 DIAGNOSIS — I420 Dilated cardiomyopathy: Secondary | ICD-10-CM | POA: Diagnosis not present

## 2024-04-21 DIAGNOSIS — D631 Anemia in chronic kidney disease: Secondary | ICD-10-CM | POA: Diagnosis not present

## 2024-04-21 DIAGNOSIS — N1832 Chronic kidney disease, stage 3b: Secondary | ICD-10-CM | POA: Diagnosis not present

## 2024-04-21 DIAGNOSIS — M4726 Other spondylosis with radiculopathy, lumbar region: Secondary | ICD-10-CM | POA: Diagnosis not present

## 2024-04-21 DIAGNOSIS — F5101 Primary insomnia: Secondary | ICD-10-CM | POA: Diagnosis not present

## 2024-04-21 DIAGNOSIS — E440 Moderate protein-calorie malnutrition: Secondary | ICD-10-CM | POA: Diagnosis not present

## 2024-04-21 DIAGNOSIS — I5042 Chronic combined systolic (congestive) and diastolic (congestive) heart failure: Secondary | ICD-10-CM | POA: Diagnosis not present

## 2024-04-21 DIAGNOSIS — M47814 Spondylosis without myelopathy or radiculopathy, thoracic region: Secondary | ICD-10-CM | POA: Diagnosis not present

## 2024-04-21 DIAGNOSIS — Z7951 Long term (current) use of inhaled steroids: Secondary | ICD-10-CM | POA: Diagnosis not present

## 2024-04-21 DIAGNOSIS — J4489 Other specified chronic obstructive pulmonary disease: Secondary | ICD-10-CM | POA: Diagnosis not present

## 2024-04-21 DIAGNOSIS — Z7982 Long term (current) use of aspirin: Secondary | ICD-10-CM | POA: Diagnosis not present

## 2024-04-21 DIAGNOSIS — D630 Anemia in neoplastic disease: Secondary | ICD-10-CM | POA: Diagnosis not present

## 2024-04-21 DIAGNOSIS — I2510 Atherosclerotic heart disease of native coronary artery without angina pectoris: Secondary | ICD-10-CM | POA: Diagnosis not present

## 2024-04-21 DIAGNOSIS — E782 Mixed hyperlipidemia: Secondary | ICD-10-CM | POA: Diagnosis not present

## 2024-04-21 DIAGNOSIS — H938X2 Other specified disorders of left ear: Secondary | ICD-10-CM | POA: Diagnosis not present

## 2024-04-21 DIAGNOSIS — M8008XD Age-related osteoporosis with current pathological fracture, vertebra(e), subsequent encounter for fracture with routine healing: Secondary | ICD-10-CM | POA: Diagnosis not present

## 2024-04-21 DIAGNOSIS — Z936 Other artificial openings of urinary tract status: Secondary | ICD-10-CM | POA: Diagnosis not present

## 2024-04-21 DIAGNOSIS — I5022 Chronic systolic (congestive) heart failure: Secondary | ICD-10-CM

## 2024-04-21 DIAGNOSIS — D520 Dietary folate deficiency anemia: Secondary | ICD-10-CM | POA: Diagnosis not present

## 2024-04-21 DIAGNOSIS — C679 Malignant neoplasm of bladder, unspecified: Secondary | ICD-10-CM | POA: Diagnosis not present

## 2024-04-21 NOTE — Progress Notes (Addendum)
 Subjective:  Patient ID: [name], female    DOB: 1956/01/15  Age: 68 y.o. MRN: [id number]  Chief Complaint  Patient presents with   Medical Management of Chronic Issues    Discussed the use of AI scribe software for clinical note transcription with the patient, who gave verbal consent to proceed.  History of Present Illness   [name] is a 68 year old female with metastatic bladder and bone cancer who presents for a power wheelchair evaluation and nutritional support.  Evaluation for POW:  The patient presents today for a mobility evaluation. The patient is unable to perform the following Mobility-Related Activities of Daily Living (MRADLs) safely or within a normal time period at home: bathing, grooming, dressing, feeding, toileting, moving from room to room, meal prep, and home management. The patient is unable to complete ADLs inside the home with a cane, walker, or manual wheelchair for ambulation. The patient has the physical and mental abilities to operate a power wheelchair in his/her home. The patient is willing and motivated to use a power chair in the home. A scooter will not work because (unable to maneuver in home due to space restrictions, unable to use steering device, etc.)   She seeks a power wheelchair due to limited mobility, as she can walk less than twenty feet with a walker and assistance, primarily moving from her bedroom to the kitchen. She is confined to a wheelchair most of the day and requires assistance to transfer from bed to chair. Home health care support is in place, with her daughter providing daily care, except for two days a week.  She experiences nutritional challenges, with recent lab work showing low albumin  levels. She uses Ensure for nutritional support, but no other nutritional interventions have been attempted other than regular meals provided by her caregiver. She finds that the Ensure provides the nutrition she needs when she is unable to  eat due to side effects from chemotherapy. She has a port for lab draws to avoid frequent needle sticks.  She has a history of metastatic bladder cancer, which has spread to her bones, including possibly her spine. She is undergoing chemotherapy once a week and has experienced hair loss as a side effect, though her hair is beginning to grow back. She uses oxygen  at night at a rate of two liters and has a catheter in place.  She reports ear discomfort, describing a sensation of it being 'stopped up'. She has had tubes placed in her ears previously. She experiences cold sensitivity, likely due to chemotherapy, and reports being cold frequently, even indoors.         09/25/2023    2:39 PM 06/02/2023    4:01 PM 04/07/2023    1:58 PM 12/31/2022    3:37 PM 10/10/2022    4:32 PM  Depression screen PHQ 2/9  Decreased Interest 1 2 0 1 3  Down, Depressed, Hopeless 1 2 1 3 3   PHQ - 2 Score 2 4 1 4 6   Altered sleeping 3 0 3 0 0  Tired, decreased energy 1 2 0 1 3  Change in appetite 2 3 3 1 3   Feeling bad or failure about yourself  1 1 2  0 0  Trouble concentrating 1 0 0 1 0  Moving slowly or fidgety/restless 1 0 1 0 0  Suicidal thoughts 0 0 0 0 0  PHQ-9 Score 11 10 10 7 12   Difficult doing work/chores Not difficult at all Somewhat difficult  Very difficult Not difficult at all Not difficult at all        06/02/2023    4:00 PM  Fall Risk   Falls in the past year? 0  Number falls in past yr: 0  Injury with Fall? 0  Risk for fall due to : Impaired balance/gait  Follow up Falls evaluation completed;Follow up appointment    Patient Care Team: Teressa Harrie HERO, FNP as PCP - General (Family Medicine) Inocencio Soyla Lunger, MD as PCP - Electrophysiology (Cardiology) Bernie Lamar PARAS, MD as PCP - Cardiology (Cardiology) Mavis Purchase, MD as Consulting Physician (Neurosurgery) Bernie Lamar PARAS, MD as Consulting Physician (Cardiology) Sheldon Standing, MD as Consulting Physician (General  Surgery) Skeet Juliene SAUNDERS, DO as Consulting Physician (Neurology) Delores Lauraine NOVAK, [facility] (Inactive) as Pharmacist (Pharmacist) Saintclair Jasper, MD as Consulting Physician (Gastroenterology) Zehr, Harlene BIRCH, PA-C as Physician Assistant (Gastroenterology) Dennise Hoes, MD as Consulting Physician (Nephrology) Pickenpack-Cousar, Fannie SAILOR, NP as Nurse Practitioner [facility] and Palliative Medicine)   Review of Systems  Constitutional:  Positive for fatigue. Negative for chills, diaphoresis and fever.  HENT:  Negative for congestion, ear pain and sinus pain.   Eyes: Negative.   Respiratory:  Positive for shortness of breath. Negative for cough.   Cardiovascular:  Negative for chest pain.  Gastrointestinal:  Negative for abdominal pain, constipation, diarrhea, nausea and vomiting.  Endocrine: Negative.   Genitourinary:  Negative for dysuria, frequency and urgency.       Indwelling catheter - incontinent of urine  Musculoskeletal:  Positive for arthralgias.       Limited mobility   Allergic/Immunologic: Negative.   Neurological:  Negative for dizziness, weakness, light-headedness and headaches.  Hematological: Negative.   Psychiatric/Behavioral:  Negative for dysphoric mood. The patient is not nervous/anxious.     Current Outpatient Medications on File Prior to Visit  Medication Sig Dispense Refill   ARIPiprazole  (ABILIFY ) 5 MG tablet TAKE 1 TABLET BY MOUTH DAILY 30 tablet 10   aspirin  81 MG tablet Take 81 mg by mouth daily.     BREO ELLIPTA  100-25 MCG/ACT AEPB INHALE 1 PUFF INTO THE LUNGS DAILY. (Patient taking differently: Inhale 1 puff into the lungs daily as needed (SOB).) 60 each 10   Carboxymethylcell-Glycerin  PF 0.5-0.9 % SOLN Place 2 drops into both eyes 4 (four) times daily. 1 each 11   carvedilol  (COREG ) 3.125 MG tablet TAKE ONE (1) TABLET BY MOUTH TWICE DAILY WITH MEALS (Patient taking differently: Take 3.125 mg by mouth 2 (two) times daily with a meal.) 60 tablet 10   denosumab  (PROLIA ) 60  MG/ML SOSY injection Inject 60 mg into the skin every 6 (six) months. 180 mL 2   dextromethorphan  15 MG/5ML syrup Take 10 mLs (30 mg total) by mouth 4 (four) times daily as needed for cough. 120 mL 0   diclofenac  Sodium (VOLTAREN ) 1 % GEL Apply 2 g topically 4 (four) times daily. (Patient taking differently: Apply 2 g topically as needed (Pain).) 50 g 4   diphenoxylate -atropine  (LOMOTIL ) 2.5-0.025 MG tablet Take 1 tablet by mouth 4 (four) times daily as needed for diarrhea or loose stools. 60 tablet 0   dronabinol  (MARINOL ) 5 MG capsule Take 1 capsule (5 mg total) by mouth 2 (two) times daily before lunch and supper. 60 capsule 0   ENTRESTO  24-26 MG TAKE ONE (1) TABLET BY MOUTH TWICE DAILY 60 tablet 10   EPINEPHRINE  0.3 mg/0.3 mL IJ SOAJ injection Inject 0.3 mLs (0.3 mg total) into the muscle as needed  for anaphylaxis. 1 each 2   esomeprazole  (NEXIUM ) 20 MG capsule TAKE ONE (1) CAPSULE BY MOUTH ONCE DAILY 90 capsule 3   FARXIGA 5 MG TABS tablet Take 5 mg by mouth daily.     ferrous sulfate  325 (65 FE) MG tablet Take 1 tablet (325 mg total) by mouth at bedtime.     fluticasone  (FLONASE ) 50 MCG/ACT nasal spray USE 2 SPRAYS IN EACH NOSTRILS DAILY AS NEEDED (Patient taking differently: Place 2 sprays into both nostrils daily as needed for allergies or rhinitis.) 48 g 0   folic acid  (FOLVITE ) 1 MG tablet Take 1 tablet (1 mg total) by mouth daily.     furosemide  (LASIX ) 20 MG tablet Take 40 mg by mouth daily.     ipratropium-albuterol  (DUONEB) 0.5-2.5 (3) MG/3ML SOLN USE 1 VIAL VIA NEBULIZER EVERY 6 HOURS AS NEEDED FOR SHORTNESS OF BREATH (Patient taking differently: Take 3 mLs by nebulization every 6 (six) hours as needed (SOB).) 90 mL 0   levocetirizine (XYZAL) 5 MG tablet Take 5 mg by mouth daily as needed for allergies.     lidocaine  (LIDODERM ) 5 % Place 1 patch onto the skin daily. Remove & Discard patch within 12 hours or as directed by MD     lidocaine -prilocaine  (EMLA ) cream Apply to affected area  once (Patient taking differently: Apply 1 Application topically once. Apply to affected area once) 30 g 3   lubiprostone  (AMITIZA ) 24 MCG capsule TAKE 1 CAPSULE(24 MCG) BY MOUTH TWICE DAILY WITH A MEAL (Patient taking differently: Take 24 mcg by mouth 2 (two) times daily with a meal.) 180 capsule 1   methadone  (DOLOPHINE ) 10 MG tablet Take 1 tablet (10 mg total) by mouth every 8 (eight) hours. 75 tablet 0   methylPREDNISolone  (MEDROL  DOSEPAK) 4 MG TBPK tablet If significant rash, will start at 6 tabs on day 1, then 5 tabs on day 2, then 4 tabs on day 3, then 3 tabs on day 4, then 2 tabs on day 5 and then 1 tab on day 6. Take with food. 21 tablet 0   metoCLOPramide  (REGLAN ) 10 MG tablet Take 1 tablet (10 mg total) by mouth every 8 (eight) hours as needed for nausea. 45 tablet 1   Multiple Vitamin (MULTIVITAMIN WITH MINERALS) TABS tablet Take 1 tablet by mouth daily.     naloxone  (NARCAN ) nasal spray 4 mg/0.1 mL Place 1 spray into the nose once.     nitroGLYCERIN  (NITROSTAT ) 0.4 MG SL tablet Place 1 tablet (0.4 mg total) under the tongue every 5 (five) minutes as needed for chest pain. 25 tablet 6   ondansetron  (ZOFRAN ) 4 MG tablet Take 1 tablet (4 mg total) by mouth every 8 (eight) hours as needed for nausea or vomiting. 20 tablet 0   ondansetron  (ZOFRAN ) 8 MG tablet Take 1 tablet (8 mg total) by mouth every 8 (eight) hours as needed for nausea or vomiting. 30 tablet 1   Oxycodone  HCl 20 MG TABS Take 1 tablet (20 mg total) by mouth every 4 (four) hours as needed. 90 tablet 0   potassium chloride  (KLOR-CON  M) 10 MEQ tablet Take 2 tablets (20 mEq total) by mouth 2 (two) times daily. 180 tablet 1   ranolazine  (RANEXA ) 500 MG 12 hr tablet TAKE 1 TABLET BY MOUTH TWICE DAILY 60 tablet 10   rosuvastatin  (CRESTOR ) 20 MG tablet TAKE 1 TABLET BY MOUTH ONCE DAILY 90 tablet 1   sertraline  (ZOLOFT ) 100 MG tablet TAKE 1/2 TABLET(50 MG) BY MOUTH  DAILY (Patient taking differently: Take 50 mg by mouth daily.) 45 tablet  1   sulfamethoxazole -trimethoprim  (BACTRIM  DS) 800-160 MG tablet Take 1 tablet by mouth 2 (two) times daily. 14 tablet 0   tiZANidine  (ZANAFLEX ) 4 MG tablet Take 1 tablet (4 mg total) by mouth at bedtime. 30 tablet 3   topiramate  (TOPAMAX ) 50 MG tablet TAKE TWO (2) TABLETS BY MOUTH EVERY DAY AT BEDTIME (Patient taking differently: Take 50 mg by mouth at bedtime. TAKE TWO (2) TABLETS BY MOUTH EVERY DAY AT BEDTIME) 60 tablet 10   Ubrogepant  (UBRELVY ) 100 MG TABS Take 1 tablet (100 mg total) by mouth as needed (May repeat after 2 hours.  Maximum 2 tablets in 24 hours). TAKE 1 TABLET BY MOUTH BY MOUTH AS NEEDED( MAY REPEAT 1 TABLET AFTER 2 HOURS IF NEEDED, MAXIMUM 2 TABLETS IN 24 HOURS) Strength: 100 mg 48 tablet 1   Vitamin D , Ergocalciferol , (DRISDOL ) 1.25 MG (50000 UNIT) CAPS capsule TAKE 1 CAPSULE BY MOUTH EVERY 7 DAYS FOR 12 DOSES (Patient taking differently: Take 50,000 Units by mouth every 7 (seven) days. Patient usually takes this medication on mondays) 12 capsule 0   No current facility-administered medications on file prior to visit.   Past Medical History:  Diagnosis Date   Abnormality of gait due to impairment of balance 11/22/2020   Admission for long-term opiate analgesic use 10/24/2019   AKI (acute kidney injury) (HCC) 11/14/2023   Anemia of chronic disease 06/07/2023   Arthralgia of both hands 04/07/2023   Arthritis of right hip 05/29/2016   Formatting of this note might be different from the original. Added automatically from request for surgery [id number]   Arthritis of right knee 11/27/2016   At risk for side effect of medication 12/18/2023   Bilateral lower extremity edema 06/07/2023   BMI 26.0-26.9,adult 03/29/2020   Bone lesion 11/18/2023   Cardiomyopathy (HCC)    Overview:  Ejection fraction 45% in 2015 Ejection fraction 30 to 35% in November 2018   Chronic active hepatitis (HCC) 01/04/2023   Chronic back pain    Chronic hypoxemic respiratory failure (HCC) 10/24/2019    Chronic narcotic use 03/23/2015   Chronic pain of both knees 12/21/2018   Added automatically from request for surgery [id number]  Formatting of this note might be different from the original. 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   Dyslipidemia 05/31/2015   Elevated serum glucose 06/23/2023   Fatigue 06/23/2023   Folate deficiency 01/08/2024   GERD (gastroesophageal reflux disease)    History of total hip replacement 09/25/2016   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HCC) 07/13/2020   Hypotension 05/17/2020   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   Ileus following gastrointestinal surgery (HCC) 03/26/2015  Iron deficiency 11/21/2023   Low vitamin B12 level 11/21/2023   Major depressive disorder, single episode, moderate (HCC) 10/24/2019   Malnutrition of moderate degree (HCC) 10/20/2020   Mesenteric ischemia (HCC) 10/16/2020   Migraine without aura and without status migrainosus, not intractable 01/04/2023   Mixed hyperlipidemia 05/31/2015   Mixed incontinence 10/20/2020   Moderate protein-calorie malnutrition (HCC)  11/03/2023   Nausea with vomiting    Nausea without vomiting 01/29/2024   Nephrostomy present (HCC) 12/01/2023   Neuropathy 01/29/2024   Normocytic anemia 06/23/2023   Osteoporosis 10/24/2019   Other spondylosis with radiculopathy, lumbar region 10/24/2019   Paraplegia (HCC) 11/14/2023   Presence of left artificial hip joint 10/24/2019   Primary insomnia 01/04/2023   PTSD (post-traumatic stress disorder)    Renal lesion 11/05/2023   Rheumatoid arthritis involving multiple sites (HCC) 04/07/2023   Senile osteoporosis 10/24/2019   Serosanguineous chronic otitis media of right ear 08/02/2021   Therapeutic opioid induced constipation 01/04/2023   Thyroid  disease    Urge incontinence of urine 11/03/2023   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   Vertebral fracture, pathological 12/01/2023   Vitamin D  deficiency 09/25/2023   Past Surgical History:  Procedure Laterality Date   APPENDECTOMY     CARDIAC CATHETERIZATION     CARPAL TUNNEL RELEASE     CESAREAN SECTION     CHF s/p AICD   12/2010   CHOLECYSTECTOMY     COLONOSCOPY  10/16/2015   Mild colonic diverticulosis, predominantly in the left colon. Small internal hemrrhoids. Otherwise normal colonoscopy   COLONOSCOPY WITH PROPOFOL  N/A 09/27/2021   Procedure: COLONOSCOPY WITH PROPOFOL ;  Surgeon: Shila Gustav GAILS, MD;  Location: [facility];  Service: Endoscopy;  Laterality: N/A;   CORONARY ANGIOPLASTY     ESOPHAGOGASTRODUODENOSCOPY  02/04/2014   Mild gastritis. Otherwise normal EGD   HAND SURGERY Bilateral    ICD GENERATOR CHANGEOUT N/A 05/21/2019   Procedure: ICD GENERATOR CHANGEOUT;  Surgeon: Inocencio Soyla Lunger, MD;  [facility] INVAS[facility] CV LAB;  Service: Cardiovascular;  Laterality: N/A;   ICD IMPLANT     Medtronic   intestinal blockage 2011     IR IMAGING GUIDED PORT INSERTION  12/24/2023   IR NEPHROSTOMY PLACEMENT RIGHT  11/16/2023   IR URETERAL STENT PLACEMENT EXISTING ACCESS RIGHT  12/24/2023   LAPAROSCOPIC  ASSISTED VENTRAL HERNIA REPAIR N/A 03/23/2015   Procedure: LAPAROSCOPIC VENTRAL WALL HERNIA REPAIR;  Surgeon: Elspeth Schultze, MD;  Location: [facility];  Service: General;  Laterality: N/A;  With MESH   LAPAROSCOPIC LYSIS OF ADHESIONS N/A 03/23/2015   Procedure: LAPAROSCOPIC LYSIS OF ADHESIONS;  Surgeon: Elspeth Schultze, MD;  Location: [facility];  Service: General;  Laterality: N/A;   LSCS      x2   NASAL SEPTUM SURGERY     NECK SURGERY     fused   TONSILLECTOMY     TRANSURETHRAL RESECTION OF BLADDER TUMOR N/A 11/16/2023   Procedure: TURBT (TRANSURETHRAL RESECTION OF BLADDER TUMOR);  Surgeon: [name] Lonni Righter, MD;  Location: [facility];  Service: Urology;  Laterality: N/A;   TYMPANOSTOMY TUBE PLACEMENT Right 2024   ULNAR NERVE TRANSPOSITION  01/23/2012   Procedure: ULNAR NERVE DECOMPRESSION/TRANSPOSITION;  Surgeon: Reyes JONETTA Budge, MD;  Location: [facility];  Service: Neurosurgery;  Laterality: Left;  LEFT ulnar nerve decompression    Family History  Problem Relation Age of Onset   Hypertension Mother    Heart attack Mother    Alcohol abuse Father    Hypertension Father  Heart attack Father    Heart attack Other    Cancer Other    Heart failure Other    Anesthesia problems Neg Hx    Hypotension Neg Hx    Malignant hyperthermia Neg Hx    Pseudochol deficiency Neg Hx    Breast cancer Neg Hx    Social History   Socioeconomic History   Marital status: Single    Spouse name: Not on file   Number of children: 3   Years of education: Not on file   Highest education level: 10th grade  Occupational History   Occupation: disabled  Tobacco Use   Smoking status: Former    Current packs/day: 0.00    Average packs/day: 0.5 packs/day for 30.0 years (15.0 ttl pk-yrs)    Types: Cigarettes    Start date: 03/1990    Quit date: 03/2020    Years since quitting: 4.0   Smokeless tobacco: Never  Vaping Use   Vaping status: Never Used  Substance and Sexual Activity   Alcohol use: Yes     Alcohol/week: 2.0 standard drinks of alcohol    Types: 1 Cans of beer, 1 Standard drinks or equivalent per week    Comment: seldom   Drug use: No   Sexual activity: Not Currently  Other Topics Concern   Not on file  Social History Narrative   One level home with boyfriend   Caffeine - coffee 1-2 cups/day; Green tea 4-5 bottles a day   Exercise - some    Right handed      Social Drivers of Health   Financial Resource Strain: Low Risk  (04/05/2023)   Overall Financial Resource Strain (CARDIA)    Difficulty of Paying Living Expenses: Not very hard  Food Insecurity: No Food Insecurity (11/15/2023)   Hunger Vital Sign    Worried About Running Out of Food in the Last Year: Never true    Ran Out of Food in the Last Year: Never true  Transportation Needs: Unmet Transportation Needs (11/15/2023)   PRAPARE - Administrator, Civil Service (Medical): Yes    Lack of Transportation (Non-Medical): No  Physical Activity: Insufficiently Active (04/05/2023)   Exercise Vital Sign    Days of Exercise per Week: 3 days    Minutes of Exercise per Session: 20 min  Stress: Stress Concern Present (04/05/2023)   [employer] of Occupational Health - Occupational Stress Questionnaire    Feeling of Stress : Very much  Social Connections: Moderately Isolated (11/15/2023)   Social Connection and Isolation Panel    Frequency of Communication with Friends and Family: More than three times a week    Frequency of Social Gatherings with Friends and Family: More than three times a week    Attends Religious Services: 1 to 4 times per year    Active Member of [employer] or Organizations: No    Attends Engineer, structural: Never    Marital Status: Divorced    Objective:  BP 102/68   Pulse 91   Temp 97.8 F (36.6 C) (Temporal)   Resp 16   Ht 4' 10 (1.473 m)   Wt 135 lb 3.2 oz (61.3 kg)   SpO2 97%   BMI 28.26 kg/m      04/21/2024    3:40 PM 04/20/2024    1:14 PM 03/19/2024   12:58 PM   BP/Weight  Systolic BP 102 94   Diastolic BP 68 69   Wt. (Lbs) 135.2 135.2 132  BMI  28.26 kg/m2 28.26 kg/m2 27.59 kg/m2    Physical Exam Vitals reviewed.  Constitutional:      General: She is not in acute distress.    Appearance: Normal appearance. She is ill-appearing.  HENT:     Right Ear: Tympanic membrane normal.     Left Ear: A middle ear effusion is present.  Eyes:     Conjunctiva/sclera: Conjunctivae normal.  Cardiovascular:     Rate and Rhythm: Normal rate and regular rhythm.     Heart sounds: Normal heart sounds. No murmur heard. Pulmonary:     Effort: Pulmonary effort is normal.     Breath sounds: Normal breath sounds. No wheezing.  Abdominal:     General: Bowel sounds are normal.     Palpations: Abdomen is soft.     Tenderness: There is no abdominal tenderness.  Skin:    General: Skin is warm.  Neurological:     Mental Status: She is alert. Mental status is at baseline.  Psychiatric:        Mood and Affect: Mood normal.        Behavior: Behavior normal.      Lab Results  Component Value Date   WBC 9.3 04/19/2024   HGB 9.8 (L) 04/19/2024   HCT 31.1 (L) 04/19/2024   PLT 277 04/19/2024   GLUCOSE 95 04/19/2024   CHOL 231 (H) 09/25/2023   TRIG 117 09/25/2023   HDL 87 09/25/2023   LDLCALC 124 (H) 09/25/2023   ALT 10 04/19/2024   AST 20 04/19/2024   NA 141 04/19/2024   K 3.8 04/19/2024   CL 107 04/19/2024   CREATININE 1.15 (H) 04/19/2024   BUN 21 04/19/2024   CO2 23 04/19/2024   TSH 1.490 01/29/2024   INR 1.1 12/24/2023   HGBA1C 5.5 06/23/2023     Assessment & Plan:  Rheumatoid arthritis involving multiple sites, unspecified whether rheumatoid factor present (HCC) Assessment & Plan: Chronic Worsened by metastatic cancer of bladder to spine Impaired mobility requiring power wheelchair  Limited mobility, primarily wheelchair-bound. Requires power wheelchair for improved mobility. - Send order for power wheelchair to [facility] for fitting  and delivery.  Orders: -     For home use only DME Wheelchair electric  Chronic systolic congestive heart failure, NYHA class 2 (HCC) Assessment & Plan: Managed by specialist - Continue current medication regimen - limited mobility, recommending power wheelchair   Orders: -     For home use only DME Wheelchair electric  Chronic obstructive pulmonary disease, unspecified COPD type (HCC) Assessment & Plan: Chronic, controlled Chronic hypoxemia requiring nocturnal oxygen  - Continue 2 liters of oxygen  at night.   Moderate protein-calorie malnutrition (HCC) Assessment & Plan: Malnutrition Low albumin  and protein levels. Using Ensure for nutritional supplementation. Last metabolic panel Lab Results  Component Value Date   GLUCOSE 95 04/19/2024   NA 141 04/19/2024   K 3.8 04/19/2024   CL 107 04/19/2024   CO2 23 04/19/2024   BUN 21 04/19/2024   CREATININE 1.15 (H) 04/19/2024   GFRNONAA 51 (L) 04/19/2024   CALCIUM  9.3 04/19/2024   PHOS 2.4 (L) 11/21/2023   PROT 6.3 (L) 04/19/2024   ALBUMIN  2.9 (L) 04/19/2024   LABGLOB 2.2 09/25/2023   AGRATIO 1.9 12/31/2022   BILITOT 0.2 04/19/2024   ALKPHOS 71 04/19/2024   AST 20 04/19/2024   ALT 10 04/19/2024   ANIONGAP 11 04/19/2024    - Complete paperwork for Ensure supplementation. - Continue Ensure twice daily - Monitor nutritional status  through regular lab work.   Ear fullness, left Assessment & Plan: Eustachian tube dysfunction with middle ear effusion, left ear (suspected) Possible fluid in left ear, no infection. Could be related to sinus drainage or weather changes. - Use Flonase , directing spray towards the affected ear. - Monitor for development of infection or worsening symptoms.   Cancer, metastatic to bone [facility]) Assessment & Plan: In treatment, managed by specialist     Follow-up: Return if symptoms worsen or fail to improve.   I,Angela Taylor,acting as a Neurosurgeon for Harrie CHRISTELLA Cedar, FNP.,have documented all  relevant documentation on the behalf of Harrie CHRISTELLA Cedar, FNP,as directed by  Harrie CHRISTELLA Cedar, FNP while in the presence of Harrie CHRISTELLA Cedar, FNP.   An After Visit Summary was printed and given to the patient.  I attest that I have reviewed this visit and agree with the plan scribed by my staff.   Harrie CHRISTELLA Cedar, FNP [facility] [phone]

## 2024-04-22 ENCOUNTER — Encounter: Payer: Self-pay | Admitting: Family Medicine

## 2024-04-22 DIAGNOSIS — H938X2 Other specified disorders of left ear: Secondary | ICD-10-CM | POA: Insufficient documentation

## 2024-04-22 NOTE — Assessment & Plan Note (Signed)
 Chronic, controlled Chronic hypoxemia requiring nocturnal oxygen  - Continue 2 liters of oxygen  at night.

## 2024-04-22 NOTE — Assessment & Plan Note (Signed)
 Chronic Worsened by metastatic cancer of bladder to spine Impaired mobility requiring power wheelchair  Limited mobility, primarily wheelchair-bound. Requires power wheelchair for improved mobility. - Send order for power wheelchair to [facility] for fitting and delivery.

## 2024-04-22 NOTE — Assessment & Plan Note (Signed)
 Eustachian tube dysfunction with middle ear effusion, left ear (suspected) Possible fluid in left ear, no infection. Could be related to sinus drainage or weather changes. - Use Flonase , directing spray towards the affected ear. - Monitor for development of infection or worsening symptoms.

## 2024-04-22 NOTE — Assessment & Plan Note (Signed)
 Malnutrition Low albumin  and protein levels. Using Ensure for nutritional supplementation. Last metabolic panel Lab Results  Component Value Date   GLUCOSE 95 04/19/2024   NA 141 04/19/2024   K 3.8 04/19/2024   CL 107 04/19/2024   CO2 23 04/19/2024   BUN 21 04/19/2024   CREATININE 1.15 (H) 04/19/2024   GFRNONAA 51 (L) 04/19/2024   CALCIUM  9.3 04/19/2024   PHOS 2.4 (L) 11/21/2023   PROT 6.3 (L) 04/19/2024   ALBUMIN  2.9 (L) 04/19/2024   LABGLOB 2.2 09/25/2023   AGRATIO 1.9 12/31/2022   BILITOT 0.2 04/19/2024   ALKPHOS 71 04/19/2024   AST 20 04/19/2024   ALT 10 04/19/2024   ANIONGAP 11 04/19/2024    - Complete paperwork for Ensure supplementation. - Continue Ensure twice daily - Monitor nutritional status through regular lab work.

## 2024-04-22 NOTE — Assessment & Plan Note (Addendum)
 Managed by specialist - Continue current medication regimen - limited mobility, recommending power wheelchair

## 2024-04-23 ENCOUNTER — Ambulatory Visit

## 2024-04-24 ENCOUNTER — Other Ambulatory Visit (HOSPITAL_COMMUNITY): Payer: Self-pay

## 2024-04-26 ENCOUNTER — Other Ambulatory Visit: Payer: Self-pay

## 2024-04-26 ENCOUNTER — Other Ambulatory Visit (HOSPITAL_COMMUNITY): Payer: Self-pay

## 2024-04-26 DIAGNOSIS — C679 Malignant neoplasm of bladder, unspecified: Secondary | ICD-10-CM

## 2024-04-27 ENCOUNTER — Other Ambulatory Visit: Payer: Self-pay

## 2024-04-27 NOTE — Telephone Encounter (Signed)
 Opened in error

## 2024-04-28 NOTE — Assessment & Plan Note (Signed)
 In treatment, managed by specialist

## 2024-04-29 DIAGNOSIS — M8008XD Age-related osteoporosis with current pathological fracture, vertebra(e), subsequent encounter for fracture with routine healing: Secondary | ICD-10-CM | POA: Diagnosis not present

## 2024-04-29 DIAGNOSIS — I2510 Atherosclerotic heart disease of native coronary artery without angina pectoris: Secondary | ICD-10-CM | POA: Diagnosis not present

## 2024-04-29 DIAGNOSIS — M069 Rheumatoid arthritis, unspecified: Secondary | ICD-10-CM | POA: Diagnosis not present

## 2024-04-29 DIAGNOSIS — M4726 Other spondylosis with radiculopathy, lumbar region: Secondary | ICD-10-CM | POA: Diagnosis not present

## 2024-04-29 DIAGNOSIS — D631 Anemia in chronic kidney disease: Secondary | ICD-10-CM | POA: Diagnosis not present

## 2024-04-29 DIAGNOSIS — Z7982 Long term (current) use of aspirin: Secondary | ICD-10-CM | POA: Diagnosis not present

## 2024-04-29 DIAGNOSIS — Z936 Other artificial openings of urinary tract status: Secondary | ICD-10-CM | POA: Diagnosis not present

## 2024-04-29 DIAGNOSIS — F5101 Primary insomnia: Secondary | ICD-10-CM | POA: Diagnosis not present

## 2024-04-29 DIAGNOSIS — M47814 Spondylosis without myelopathy or radiculopathy, thoracic region: Secondary | ICD-10-CM | POA: Diagnosis not present

## 2024-04-29 DIAGNOSIS — J4489 Other specified chronic obstructive pulmonary disease: Secondary | ICD-10-CM | POA: Diagnosis not present

## 2024-04-29 DIAGNOSIS — E782 Mixed hyperlipidemia: Secondary | ICD-10-CM | POA: Diagnosis not present

## 2024-04-29 DIAGNOSIS — G8220 Paraplegia, unspecified: Secondary | ICD-10-CM | POA: Diagnosis not present

## 2024-04-29 DIAGNOSIS — N1832 Chronic kidney disease, stage 3b: Secondary | ICD-10-CM | POA: Diagnosis not present

## 2024-04-29 DIAGNOSIS — I420 Dilated cardiomyopathy: Secondary | ICD-10-CM | POA: Diagnosis not present

## 2024-04-29 DIAGNOSIS — C679 Malignant neoplasm of bladder, unspecified: Secondary | ICD-10-CM | POA: Diagnosis not present

## 2024-04-29 DIAGNOSIS — D630 Anemia in neoplastic disease: Secondary | ICD-10-CM | POA: Diagnosis not present

## 2024-04-29 DIAGNOSIS — D520 Dietary folate deficiency anemia: Secondary | ICD-10-CM | POA: Diagnosis not present

## 2024-04-29 DIAGNOSIS — E440 Moderate protein-calorie malnutrition: Secondary | ICD-10-CM | POA: Diagnosis not present

## 2024-04-29 DIAGNOSIS — Z7951 Long term (current) use of inhaled steroids: Secondary | ICD-10-CM | POA: Diagnosis not present

## 2024-04-29 DIAGNOSIS — J9611 Chronic respiratory failure with hypoxia: Secondary | ICD-10-CM | POA: Diagnosis not present

## 2024-04-29 DIAGNOSIS — I5042 Chronic combined systolic (congestive) and diastolic (congestive) heart failure: Secondary | ICD-10-CM | POA: Diagnosis not present

## 2024-04-30 ENCOUNTER — Other Ambulatory Visit

## 2024-04-30 ENCOUNTER — Ambulatory Visit

## 2024-04-30 NOTE — Telephone Encounter (Signed)
 Judy-This was sent to me since I was working Dr. Fredrik desk one day. Andrea CHRISTELLA Plunk, RN

## 2024-05-02 NOTE — Progress Notes (Unsigned)
 [facility] OFFICE PROGRESS NOTE  Patient Care Team: Teressa Harrie HERO, FNP as PCP - General (Family Medicine) Inocencio Soyla Lunger, MD as PCP - Electrophysiology (Cardiology) Bernie Lamar PARAS, MD as PCP - Cardiology (Cardiology) Mavis Purchase, MD as Consulting Physician (Neurosurgery) Bernie Lamar PARAS, MD as Consulting Physician (Cardiology) Sheldon Standing, MD as Consulting Physician (General Surgery) Skeet Juliene SAUNDERS, DO as Consulting Physician (Neurology) Delores Lauraine NOVAK, [facility] (Inactive) as Pharmacist (Pharmacist) Saintclair Jasper, MD as Consulting Physician (Gastroenterology) Zehr, Harlene BIRCH, PA-C as Physician Assistant (Gastroenterology) Dennise Hoes, MD as Consulting Physician (Nephrology) Pickenpack-Cousar, Fannie SAILOR, NP as Nurse Practitioner (Hospice and Palliative Medicine)  68 y.o.female with past medical history of COPD, chronic back from from DDD, osteoporosis with fractures presenting with back pain radiating down to the hip in March of 2025 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  Cystoscopy with TURBT show right sided bladder tumor with palpable anterior vaginal mass.  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    Current diagnosis: Stage IVB UC with bone metastases Treatment:  12/18/23 C1D1 EV/P  Assessment & Plan   No orders of the defined types were placed in this encounter.    [name], MD  INTERVAL HISTORY: Patient returns for follow-up.  Oncology History  Urothelial carcinoma of bladder (HCC)  11/18/2023 Initial Diagnosis   Urothelial carcinoma of bladder (HCC)   12/01/2023 Cancer Staging   Staging form: Urinary Bladder, AJCC 8th Edition - Clinical: Stage IVB (cTX, cNX, pM1b) - Signed by Chihuahua Pauletta JAYSON, MD on 12/01/2023 WHO/ISUP grade (low/high): High Grade Histologic grading system: 2 grade system   12/18/2023 -  Chemotherapy   12/18/23 C1D1 01/08/24 C2D1 01/29/24  C3D1. Pembro only. Day 8 reduced EV to 1mg /kg Patient is on Treatment Plan : UROTHELIAL ADVANCED, METASTATIC ENFORTUMAB D1, D8 + PEMBROLIZUMAB  (200) D1 Q21D      02/12/2024 PET scan   PET 1. Response to therapy of osseous metastasis. 2. The pelvis is again poorly evaluated. Soft tissue density within the deep pelvis is less distinct today with decreased hypermetabolism. Potential etiologies again include response to therapy of a synchronous uterine primary versus isolated metastasis (presuming the patient is status post hysterectomy). 3. Placement of a right ureteric stent with similar mild right-sided hydronephrosis. The left-sided hydroureteronephrosis has resolved. 4.  Aortic Atherosclerosis (ICD10-I70.0).      PHYSICAL EXAMINATION: ECOG PERFORMANCE STATUS: {CHL ONC ECOG PS:[id number]}  There were no vitals filed for this visit. There were no vitals filed for this visit.  GENERAL: alert, no distress and comfortable SKIN: skin color normal and no bruising or petechiae or jaundice on exposed skin EYES: normal, sclera clear OROPHARYNX: no exudate  NECK: No palpable mass LYMPH:  no palpable cervical, axillary lymphadenopathy  LUNGS: clear to auscultation and percussion with normal breathing effort HEART: regular rate & rhythm  ABDOMEN: abdomen soft, non-tender and nondistended. Musculoskeletal: no edema NEURO: no focal motor/sensory deficits  Relevant data reviewed during this visit included ***

## 2024-05-03 ENCOUNTER — Other Ambulatory Visit (HOSPITAL_COMMUNITY): Payer: Self-pay

## 2024-05-03 ENCOUNTER — Inpatient Hospital Stay

## 2024-05-03 ENCOUNTER — Inpatient Hospital Stay (HOSPITAL_BASED_OUTPATIENT_CLINIC_OR_DEPARTMENT_OTHER)

## 2024-05-03 VITALS — BP 110/69 | HR 92 | Temp 98.2°F | Resp 16

## 2024-05-03 DIAGNOSIS — Z79620 Long term (current) use of immunosuppressive biologic: Secondary | ICD-10-CM | POA: Insufficient documentation

## 2024-05-03 DIAGNOSIS — C7951 Secondary malignant neoplasm of bone: Secondary | ICD-10-CM | POA: Insufficient documentation

## 2024-05-03 DIAGNOSIS — C679 Malignant neoplasm of bladder, unspecified: Secondary | ICD-10-CM | POA: Insufficient documentation

## 2024-05-03 DIAGNOSIS — D638 Anemia in other chronic diseases classified elsewhere: Secondary | ICD-10-CM

## 2024-05-03 DIAGNOSIS — G629 Polyneuropathy, unspecified: Secondary | ICD-10-CM | POA: Diagnosis not present

## 2024-05-03 DIAGNOSIS — D520 Dietary folate deficiency anemia: Secondary | ICD-10-CM

## 2024-05-03 DIAGNOSIS — R600 Localized edema: Secondary | ICD-10-CM | POA: Diagnosis not present

## 2024-05-03 DIAGNOSIS — R7989 Other specified abnormal findings of blood chemistry: Secondary | ICD-10-CM

## 2024-05-03 DIAGNOSIS — M8458XA Pathological fracture in neoplastic disease, other specified site, initial encounter for fracture: Secondary | ICD-10-CM

## 2024-05-03 DIAGNOSIS — N179 Acute kidney failure, unspecified: Secondary | ICD-10-CM

## 2024-05-03 DIAGNOSIS — Z79899 Other long term (current) drug therapy: Secondary | ICD-10-CM | POA: Diagnosis not present

## 2024-05-03 DIAGNOSIS — Z936 Other artificial openings of urinary tract status: Secondary | ICD-10-CM | POA: Diagnosis not present

## 2024-05-03 DIAGNOSIS — Z5112 Encounter for antineoplastic immunotherapy: Secondary | ICD-10-CM | POA: Insufficient documentation

## 2024-05-03 LAB — CBC WITH DIFFERENTIAL (CANCER CENTER ONLY)
Abs Immature Granulocytes: 0.02 K/uL (ref 0.00–0.07)
Basophils Absolute: 0.1 K/uL (ref 0.0–0.1)
Basophils Relative: 2 %
Eosinophils Absolute: 2 K/uL — ABNORMAL HIGH (ref 0.0–0.5)
Eosinophils Relative: 22 %
HCT: 29.3 % — ABNORMAL LOW (ref 36.0–46.0)
Hemoglobin: 9.5 g/dL — ABNORMAL LOW (ref 12.0–15.0)
Immature Granulocytes: 0 %
Lymphocytes Relative: 19 %
Lymphs Abs: 1.6 K/uL (ref 0.7–4.0)
MCH: 29.3 pg (ref 26.0–34.0)
MCHC: 32.4 g/dL (ref 30.0–36.0)
MCV: 90.4 fL (ref 80.0–100.0)
Monocytes Absolute: 0.6 K/uL (ref 0.1–1.0)
Monocytes Relative: 7 %
Neutro Abs: 4.4 K/uL (ref 1.7–7.7)
Neutrophils Relative %: 50 %
Platelet Count: 352 K/uL (ref 150–400)
RBC: 3.24 MIL/uL — ABNORMAL LOW (ref 3.87–5.11)
RDW: 15.2 % (ref 11.5–15.5)
WBC Count: 8.8 K/uL (ref 4.0–10.5)
nRBC: 0 % (ref 0.0–0.2)

## 2024-05-03 LAB — CMP (CANCER CENTER ONLY)
ALT: 6 U/L (ref 0–44)
AST: 15 U/L (ref 15–41)
Albumin: 2.8 g/dL — ABNORMAL LOW (ref 3.5–5.0)
Alkaline Phosphatase: 62 U/L (ref 38–126)
Anion gap: 7 (ref 5–15)
BUN: 24 mg/dL — ABNORMAL HIGH (ref 8–23)
CO2: 27 mmol/L (ref 22–32)
Calcium: 8.8 mg/dL — ABNORMAL LOW (ref 8.9–10.3)
Chloride: 106 mmol/L (ref 98–111)
Creatinine: 1.43 mg/dL — ABNORMAL HIGH (ref 0.44–1.00)
GFR, Estimated: 40 mL/min — ABNORMAL LOW (ref >60)
Glucose, Bld: 97 mg/dL (ref 70–99)
Potassium: 3.6 mmol/L (ref 3.5–5.1)
Sodium: 140 mmol/L (ref 135–145)
Total Bilirubin: 0.3 mg/dL (ref 0.0–1.2)
Total Protein: 6.5 g/dL (ref 6.5–8.1)

## 2024-05-03 MED ORDER — SODIUM CHLORIDE 0.9 % IV SOLN
60.0000 mg | Freq: Once | INTRAVENOUS | Status: AC
  Administered 2024-05-03: 60 mg via INTRAVENOUS
  Filled 2024-05-03: qty 6

## 2024-05-03 MED ORDER — PROCHLORPERAZINE MALEATE 10 MG PO TABS
10.0000 mg | ORAL_TABLET | Freq: Once | ORAL | Status: AC
  Administered 2024-05-03: 10 mg via ORAL
  Filled 2024-05-03: qty 1

## 2024-05-03 MED ORDER — SODIUM CHLORIDE 0.9% FLUSH
10.0000 mL | Freq: Once | INTRAVENOUS | Status: AC
  Administered 2024-05-03: 10 mL

## 2024-05-03 MED ORDER — SODIUM CHLORIDE 0.9 % IV SOLN: INTRAVENOUS | Status: DC

## 2024-05-03 NOTE — Patient Instructions (Signed)
 [facility] - A DEPT OF [place]  Discharge Instructions: Thank you for choosing [facility] to provide your oncology and hematology care.   If you have a lab appointment with the Cancer Center, please go directly to the Cancer Center and check in at the registration area.   Wear comfortable clothing and clothing appropriate for easy access to any Portacath or PICC line.   We strive to give you quality time with your provider. You may need to reschedule your appointment if you arrive late (15 or more minutes).  Arriving late affects you and other patients whose appointments are after yours.  Also, if you miss three or more appointments without notifying the office, you may be dismissed from the clinic at the provider's discretion.      For prescription refill requests, have your pharmacy contact our office and allow 72 hours for refills to be completed.    Today you received the following chemotherapy and/or immunotherapy agents padcev       To help prevent nausea and vomiting after your treatment, we encourage you to take your nausea medication as directed.  BELOW ARE SYMPTOMS THAT SHOULD BE REPORTED IMMEDIATELY: *FEVER GREATER THAN 100.4 F (38 C) OR HIGHER *CHILLS OR SWEATING *NAUSEA AND VOMITING THAT IS NOT CONTROLLED WITH YOUR NAUSEA MEDICATION *UNUSUAL SHORTNESS OF BREATH *UNUSUAL BRUISING OR BLEEDING *URINARY PROBLEMS (pain or burning when urinating, or frequent urination) *BOWEL PROBLEMS (unusual diarrhea, constipation, pain near the anus) TENDERNESS IN MOUTH AND THROAT WITH OR WITHOUT PRESENCE OF ULCERS (sore throat, sores in mouth, or a toothache) UNUSUAL RASH, SWELLING OR PAIN  UNUSUAL VAGINAL DISCHARGE OR ITCHING   Items with * indicate a potential emergency and should be followed up as soon as possible or go to the Emergency Department if any problems should occur.  Please show the CHEMOTHERAPY ALERT CARD or IMMUNOTHERAPY  ALERT CARD at check-in to the Emergency Department and triage nurse.  Should you have questions after your visit or need to cancel or reschedule your appointment, please contact [facility]  Dept: [phone]  and follow the prompts.  Office hours are 8:00 a.m. to 4:30 p.m. Monday - Friday. Please note that voicemails left after 4:00 p.m. may not be returned until the following business day.  We are closed weekends and major holidays. You have access to a nurse at all times for urgent questions. Please call the main number to the clinic Dept: [phone] and follow the prompts.   For any non-urgent questions, you may also contact your provider using MyChart. We now offer e-Visits for anyone 71 and older to request care online for non-urgent symptoms. For details visit [email or web address].   Also download the MyChart app! Go to the app store, search MyChart, open the app, select , and log in with your MyChart username and password.

## 2024-05-03 NOTE — Assessment & Plan Note (Addendum)
 Stable.  Will continue to monitor lab with each treatment.

## 2024-05-03 NOTE — Assessment & Plan Note (Addendum)
 Reduced dose of EV to 1mg /kg Will try to continue day 8 back as tolerated Seeing palliative care as well for chronic pain management Continue EVP as tolerated Follow up with next cycle.

## 2024-05-03 NOTE — Assessment & Plan Note (Addendum)
 Minimal  Continue Lasix  20 mg twice daily

## 2024-05-03 NOTE — Assessment & Plan Note (Addendum)
Folic acid 1mg daily

## 2024-05-03 NOTE — Assessment & Plan Note (Addendum)
 B12 1000 mcg daily

## 2024-05-03 NOTE — Assessment & Plan Note (Addendum)
 Right nephrostomy tube Follow-up with IR for evaluation. Will communicate with IR Ongoing assessment of urine output

## 2024-05-03 NOTE — Progress Notes (Signed)
 Adjust Padcev  based on current wt 1mg /kg = 60mg  per Dr Tina Next cycle should be scheduled 2 weeks for now per Dr Tina

## 2024-05-04 ENCOUNTER — Telehealth: Payer: Self-pay

## 2024-05-04 NOTE — Telephone Encounter (Signed)
 Scheduled appointments per 9/9 secure chat and WQ. Called and left a VM for the patients daughter [name] with the appointment details for the patient.

## 2024-05-06 DIAGNOSIS — J449 Chronic obstructive pulmonary disease, unspecified: Secondary | ICD-10-CM | POA: Diagnosis not present

## 2024-05-06 DIAGNOSIS — M069 Rheumatoid arthritis, unspecified: Secondary | ICD-10-CM | POA: Diagnosis not present

## 2024-05-06 DIAGNOSIS — C679 Malignant neoplasm of bladder, unspecified: Secondary | ICD-10-CM | POA: Diagnosis not present

## 2024-05-07 DIAGNOSIS — Z936 Other artificial openings of urinary tract status: Secondary | ICD-10-CM | POA: Diagnosis not present

## 2024-05-07 DIAGNOSIS — Z7951 Long term (current) use of inhaled steroids: Secondary | ICD-10-CM | POA: Diagnosis not present

## 2024-05-07 DIAGNOSIS — C679 Malignant neoplasm of bladder, unspecified: Secondary | ICD-10-CM | POA: Diagnosis not present

## 2024-05-07 DIAGNOSIS — I5042 Chronic combined systolic (congestive) and diastolic (congestive) heart failure: Secondary | ICD-10-CM | POA: Diagnosis not present

## 2024-05-07 DIAGNOSIS — F5101 Primary insomnia: Secondary | ICD-10-CM | POA: Diagnosis not present

## 2024-05-07 DIAGNOSIS — E440 Moderate protein-calorie malnutrition: Secondary | ICD-10-CM | POA: Diagnosis not present

## 2024-05-07 DIAGNOSIS — M1712 Unilateral primary osteoarthritis, left knee: Secondary | ICD-10-CM | POA: Diagnosis not present

## 2024-05-07 DIAGNOSIS — Z7982 Long term (current) use of aspirin: Secondary | ICD-10-CM | POA: Diagnosis not present

## 2024-05-07 DIAGNOSIS — I420 Dilated cardiomyopathy: Secondary | ICD-10-CM | POA: Diagnosis not present

## 2024-05-07 DIAGNOSIS — D520 Dietary folate deficiency anemia: Secondary | ICD-10-CM | POA: Diagnosis not present

## 2024-05-07 DIAGNOSIS — M4726 Other spondylosis with radiculopathy, lumbar region: Secondary | ICD-10-CM | POA: Diagnosis not present

## 2024-05-07 DIAGNOSIS — M47814 Spondylosis without myelopathy or radiculopathy, thoracic region: Secondary | ICD-10-CM | POA: Diagnosis not present

## 2024-05-07 DIAGNOSIS — N1832 Chronic kidney disease, stage 3b: Secondary | ICD-10-CM | POA: Diagnosis not present

## 2024-05-07 DIAGNOSIS — D630 Anemia in neoplastic disease: Secondary | ICD-10-CM | POA: Diagnosis not present

## 2024-05-07 DIAGNOSIS — E782 Mixed hyperlipidemia: Secondary | ICD-10-CM | POA: Diagnosis not present

## 2024-05-07 DIAGNOSIS — M069 Rheumatoid arthritis, unspecified: Secondary | ICD-10-CM | POA: Diagnosis not present

## 2024-05-07 DIAGNOSIS — J4489 Other specified chronic obstructive pulmonary disease: Secondary | ICD-10-CM | POA: Diagnosis not present

## 2024-05-07 DIAGNOSIS — J9611 Chronic respiratory failure with hypoxia: Secondary | ICD-10-CM | POA: Diagnosis not present

## 2024-05-07 DIAGNOSIS — M8008XD Age-related osteoporosis with current pathological fracture, vertebra(e), subsequent encounter for fracture with routine healing: Secondary | ICD-10-CM | POA: Diagnosis not present

## 2024-05-07 DIAGNOSIS — G8220 Paraplegia, unspecified: Secondary | ICD-10-CM | POA: Diagnosis not present

## 2024-05-07 DIAGNOSIS — I2510 Atherosclerotic heart disease of native coronary artery without angina pectoris: Secondary | ICD-10-CM | POA: Diagnosis not present

## 2024-05-07 DIAGNOSIS — D631 Anemia in chronic kidney disease: Secondary | ICD-10-CM | POA: Diagnosis not present

## 2024-05-08 ENCOUNTER — Other Ambulatory Visit (HOSPITAL_COMMUNITY): Payer: Self-pay

## 2024-05-08 ENCOUNTER — Other Ambulatory Visit: Payer: Self-pay | Admitting: Nurse Practitioner

## 2024-05-08 DIAGNOSIS — C679 Malignant neoplasm of bladder, unspecified: Secondary | ICD-10-CM

## 2024-05-08 DIAGNOSIS — G893 Neoplasm related pain (acute) (chronic): Secondary | ICD-10-CM

## 2024-05-08 DIAGNOSIS — Z515 Encounter for palliative care: Secondary | ICD-10-CM

## 2024-05-09 ENCOUNTER — Other Ambulatory Visit (HOSPITAL_COMMUNITY): Payer: Self-pay

## 2024-05-09 MED ORDER — OXYCODONE HCL 20 MG PO TABS
20.0000 mg | ORAL_TABLET | ORAL | 0 refills | Status: DC | PRN
  Filled 2024-05-10: qty 90, 15d supply, fill #0

## 2024-05-10 ENCOUNTER — Other Ambulatory Visit (HOSPITAL_COMMUNITY): Payer: Self-pay

## 2024-05-10 ENCOUNTER — Other Ambulatory Visit: Payer: Self-pay

## 2024-05-11 ENCOUNTER — Ambulatory Visit

## 2024-05-11 ENCOUNTER — Other Ambulatory Visit

## 2024-05-11 ENCOUNTER — Encounter: Admitting: Nutrition

## 2024-05-12 DIAGNOSIS — I2510 Atherosclerotic heart disease of native coronary artery without angina pectoris: Secondary | ICD-10-CM | POA: Diagnosis not present

## 2024-05-12 DIAGNOSIS — M47814 Spondylosis without myelopathy or radiculopathy, thoracic region: Secondary | ICD-10-CM | POA: Diagnosis not present

## 2024-05-12 DIAGNOSIS — M4726 Other spondylosis with radiculopathy, lumbar region: Secondary | ICD-10-CM | POA: Diagnosis not present

## 2024-05-12 DIAGNOSIS — G8220 Paraplegia, unspecified: Secondary | ICD-10-CM | POA: Diagnosis not present

## 2024-05-12 DIAGNOSIS — I420 Dilated cardiomyopathy: Secondary | ICD-10-CM | POA: Diagnosis not present

## 2024-05-12 DIAGNOSIS — Z7951 Long term (current) use of inhaled steroids: Secondary | ICD-10-CM | POA: Diagnosis not present

## 2024-05-12 DIAGNOSIS — N1832 Chronic kidney disease, stage 3b: Secondary | ICD-10-CM | POA: Diagnosis not present

## 2024-05-12 DIAGNOSIS — M069 Rheumatoid arthritis, unspecified: Secondary | ICD-10-CM | POA: Diagnosis not present

## 2024-05-12 DIAGNOSIS — E440 Moderate protein-calorie malnutrition: Secondary | ICD-10-CM | POA: Diagnosis not present

## 2024-05-12 DIAGNOSIS — M8008XD Age-related osteoporosis with current pathological fracture, vertebra(e), subsequent encounter for fracture with routine healing: Secondary | ICD-10-CM | POA: Diagnosis not present

## 2024-05-12 DIAGNOSIS — F5101 Primary insomnia: Secondary | ICD-10-CM | POA: Diagnosis not present

## 2024-05-12 DIAGNOSIS — D630 Anemia in neoplastic disease: Secondary | ICD-10-CM | POA: Diagnosis not present

## 2024-05-12 DIAGNOSIS — E782 Mixed hyperlipidemia: Secondary | ICD-10-CM | POA: Diagnosis not present

## 2024-05-12 DIAGNOSIS — J9611 Chronic respiratory failure with hypoxia: Secondary | ICD-10-CM | POA: Diagnosis not present

## 2024-05-12 DIAGNOSIS — Z7982 Long term (current) use of aspirin: Secondary | ICD-10-CM | POA: Diagnosis not present

## 2024-05-12 DIAGNOSIS — J4489 Other specified chronic obstructive pulmonary disease: Secondary | ICD-10-CM | POA: Diagnosis not present

## 2024-05-12 DIAGNOSIS — I5042 Chronic combined systolic (congestive) and diastolic (congestive) heart failure: Secondary | ICD-10-CM | POA: Diagnosis not present

## 2024-05-12 DIAGNOSIS — Z936 Other artificial openings of urinary tract status: Secondary | ICD-10-CM | POA: Diagnosis not present

## 2024-05-12 DIAGNOSIS — D520 Dietary folate deficiency anemia: Secondary | ICD-10-CM | POA: Diagnosis not present

## 2024-05-12 DIAGNOSIS — D631 Anemia in chronic kidney disease: Secondary | ICD-10-CM | POA: Diagnosis not present

## 2024-05-12 DIAGNOSIS — C679 Malignant neoplasm of bladder, unspecified: Secondary | ICD-10-CM | POA: Diagnosis not present

## 2024-05-13 ENCOUNTER — Ambulatory Visit: Payer: Self-pay

## 2024-05-13 ENCOUNTER — Inpatient Hospital Stay: Admitting: Nurse Practitioner

## 2024-05-13 NOTE — Telephone Encounter (Signed)
 Attempted x 2, unable to leave a message. Will try again later.     Copied from CRM #[id number]. Topic: Clinical - Red Word Triage >> May 13, 2024  4:49 PM Donee H wrote: Red Word that prompted transfer to Nurse Triage: Patient experiencing pain in right ear. She was seen a by pcp on Aug 27. And was told to use FLONASE  in nose to clear ears but it's getting worse. Answer Assessment - Initial Assessment Questions Patient says she's been still having ear pain since her visit on 8/27 and that she's been using the flonase  as instructed, no relief of the pain. During the call, connection was lost. Attempted to call patient back twice, mailbox is full.     1. LOCATION: Which ear is involved?     Right ear  2. ONSET: When did the ear pain start?      Before August 27th  3. SEVERITY: How bad is the pain?  (Scale 1-10; mild, moderate or severe)     3  4. URI SYMPTOMS: Do you have a runny nose or cough?     No  5. FEVER: Do you have a fever? If Yes, ask: What is your temperature, how was it measured, and when did it start?     No  Protocols used: [place]

## 2024-05-13 NOTE — Telephone Encounter (Signed)
 FYI Only or Action Required?: FYI only for provider.  Patient was last seen in primary care on 04/21/2024 by Teressa Harrie HERO, FNP.  Called Nurse Triage reporting Otalgia.  Symptoms began several weeks ago.  Interventions attempted: Prescription medications: Flonase .  Symptoms are: unchanged.  Triage Disposition: See Physician Within 24 Hours  Patient/caregiver understands and will follow disposition?: Yes    Reason for Disposition  Earache  (Exceptions: Brief ear pain of lasting less than 60 minutes, or earache occurring during air travel.)  Protocols used: Earache-A-AH Patient called back and advised OV. She says she's not available tomorrow, scheduled first available after tomorrow with PCP.

## 2024-05-13 NOTE — Telephone Encounter (Deleted)
 Copied from CRM #[id number]. Topic: Clinical - Red Word Triage >> May 13, 2024  4:49 PM Donee H wrote: Red Word that prompted transfer to Nurse Triage: Patient experiencing pain in right ear. She was seen a by pcp on Aug 27. And was told to use FLONASE  in nose to clear ears but it's getting worse.

## 2024-05-13 NOTE — Telephone Encounter (Signed)
 This encounter was created in error - please disregard.

## 2024-05-14 DIAGNOSIS — C678 Malignant neoplasm of overlapping sites of bladder: Secondary | ICD-10-CM | POA: Diagnosis not present

## 2024-05-16 NOTE — Progress Notes (Unsigned)
 [facility] OFFICE PROGRESS NOTE  Patient Care Team: Teressa Harrie HERO, FNP as PCP - General (Family Medicine) Inocencio Soyla Lunger, MD as PCP - Electrophysiology (Cardiology) Bernie Lamar PARAS, MD as PCP - Cardiology (Cardiology) Mavis Purchase, MD as Consulting Physician (Neurosurgery) Bernie Lamar PARAS, MD as Consulting Physician (Cardiology) Sheldon Standing, MD as Consulting Physician (General Surgery) Skeet Juliene SAUNDERS, DO as Consulting Physician (Neurology) Delores Lauraine NOVAK, [facility] (Inactive) as Pharmacist (Pharmacist) Saintclair Jasper, MD as Consulting Physician (Gastroenterology) Zehr, Harlene BIRCH, PA-C as Physician Assistant (Gastroenterology) Dennise Hoes, MD as Consulting Physician (Nephrology) Pickenpack-Cousar, Fannie SAILOR, NP as Nurse Practitioner (Hospice and Palliative Medicine)   68 y.o.female with past medical history of COPD, chronic back from from DDD, osteoporosis with fractures presenting with back pain radiating down to the hip in March of 2025 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  Cystoscopy with TURBT show right sided bladder tumor with palpable anterior vaginal mass.  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    Current diagnosis: Stage IVB UC with bone metastases Treatment:  12/18/23 C1D1 EV/P   02/12/24 scan showed interval response after C3.  Clinically she is feeling better, physically more capable by herself, improvement in appetite. Assessment & Plan Urothelial carcinoma of bladder (HCC) Continue with reduced dose of EV to 1mg /kg Will try to continue day 8 back as tolerated Seeing palliative care as well for chronic pain management Continue EVP as tolerated Follow up with next cycle. Stage 3a chronic kidney disease (HCC) Clinically stable Repeat lab before each treatment Increase fluid at home Low vitamin B12 level B12 1000 mcg daily Dietary folate deficiency  anemia Folic acid  1 mg daily Neuropathy Intermittent able to tolerate treatment H/O insertion of nephrostomy tube Tammi had contacted IR to schedule for removal.  Orders Placed This Encounter  Procedures   CBC with Differential (Cancer Center Only)    Standing Status:   Future    Expected Date:   06/15/2024    Expiration Date:   06/15/2025   CMP (Cancer Center only)    Standing Status:   Future    Expected Date:   06/15/2024    Expiration Date:   06/15/2025     [name], MD  INTERVAL HISTORY: Patient returns for follow-up.  Oncology History  Urothelial carcinoma of bladder (HCC)  11/18/2023 Initial Diagnosis   Urothelial carcinoma of bladder (HCC)   12/01/2023 Cancer Staging   Staging form: Urinary Bladder, AJCC 8th Edition - Clinical: Stage IVB (cTX, cNX, pM1b) - Signed by Chihuahua Pauletta JAYSON, MD on 12/01/2023 WHO/ISUP grade (low/high): High Grade Histologic grading system: 2 grade system   12/18/2023 -  Chemotherapy   12/18/23 C1D1 01/08/24 C2D1 01/29/24 C3D1. Pembro only. Day 8 reduced EV to 1mg /kg Patient is on Treatment Plan : UROTHELIAL ADVANCED, METASTATIC ENFORTUMAB D1, D8 + PEMBROLIZUMAB  (200) D1 Q21D      02/12/2024 PET scan   PET 1. Response to therapy of osseous metastasis. 2. The pelvis is again poorly evaluated. Soft tissue density within the deep pelvis is less distinct today with decreased hypermetabolism. Potential etiologies again include response to therapy of a synchronous uterine primary versus isolated metastasis (presuming the patient is status post hysterectomy). 3. Placement of a right ureteric stent with similar mild right-sided hydronephrosis. The left-sided hydroureteronephrosis has resolved. 4.  Aortic Atherosclerosis (ICD10-I70.0).      PHYSICAL EXAMINATION: ECOG PERFORMANCE STATUS: 2 - Symptomatic, <50% confined to bed  VSS  GENERAL: alert, no distress and comfortable SKIN: skin color normal and no jaundice  Nephrostomy tube site  without erythema.  No bleeding. EYES: sclera clear LUNGS: clear to auscultation and no wheeze or rales with normal breathing effort HEART: regular rate & rhythm  ABDOMEN: abdomen soft, non-tender and nondistended. Musculoskeletal: Minimal trace bilateral ankle edema   Relevant data reviewed during this visit included labs.  New labs ordered.

## 2024-05-17 ENCOUNTER — Other Ambulatory Visit: Payer: Self-pay | Admitting: *Deleted

## 2024-05-17 ENCOUNTER — Inpatient Hospital Stay

## 2024-05-17 ENCOUNTER — Inpatient Hospital Stay (HOSPITAL_BASED_OUTPATIENT_CLINIC_OR_DEPARTMENT_OTHER)

## 2024-05-17 VITALS — BP 119/80 | HR 85 | Temp 98.4°F | Resp 16 | Wt 124.6 lb

## 2024-05-17 DIAGNOSIS — D520 Dietary folate deficiency anemia: Secondary | ICD-10-CM | POA: Diagnosis not present

## 2024-05-17 DIAGNOSIS — Z9889 Other specified postprocedural states: Secondary | ICD-10-CM

## 2024-05-17 DIAGNOSIS — R7989 Other specified abnormal findings of blood chemistry: Secondary | ICD-10-CM

## 2024-05-17 DIAGNOSIS — C679 Malignant neoplasm of bladder, unspecified: Secondary | ICD-10-CM

## 2024-05-17 DIAGNOSIS — C7951 Secondary malignant neoplasm of bone: Secondary | ICD-10-CM | POA: Diagnosis not present

## 2024-05-17 DIAGNOSIS — Z79899 Other long term (current) drug therapy: Secondary | ICD-10-CM | POA: Diagnosis not present

## 2024-05-17 DIAGNOSIS — G629 Polyneuropathy, unspecified: Secondary | ICD-10-CM

## 2024-05-17 DIAGNOSIS — N1831 Chronic kidney disease, stage 3a: Secondary | ICD-10-CM

## 2024-05-17 DIAGNOSIS — Z79620 Long term (current) use of immunosuppressive biologic: Secondary | ICD-10-CM | POA: Diagnosis not present

## 2024-05-17 DIAGNOSIS — R600 Localized edema: Secondary | ICD-10-CM | POA: Diagnosis not present

## 2024-05-17 DIAGNOSIS — D638 Anemia in other chronic diseases classified elsewhere: Secondary | ICD-10-CM | POA: Diagnosis not present

## 2024-05-17 DIAGNOSIS — Z5112 Encounter for antineoplastic immunotherapy: Secondary | ICD-10-CM | POA: Diagnosis not present

## 2024-05-17 LAB — CMP (CANCER CENTER ONLY)
ALT: 8 U/L (ref 0–44)
AST: 14 U/L — ABNORMAL LOW (ref 15–41)
Albumin: 3.4 g/dL — ABNORMAL LOW (ref 3.5–5.0)
Alkaline Phosphatase: 52 U/L (ref 38–126)
Anion gap: 6 (ref 5–15)
BUN: 33 mg/dL — ABNORMAL HIGH (ref 8–23)
CO2: 26 mmol/L (ref 22–32)
Calcium: 9.5 mg/dL (ref 8.9–10.3)
Chloride: 107 mmol/L (ref 98–111)
Creatinine: 1.61 mg/dL — ABNORMAL HIGH (ref 0.44–1.00)
GFR, Estimated: 34 mL/min — ABNORMAL LOW (ref >60)
Glucose, Bld: 93 mg/dL (ref 70–99)
Potassium: 4.1 mmol/L (ref 3.5–5.1)
Sodium: 139 mmol/L (ref 135–145)
Total Bilirubin: 0.3 mg/dL (ref 0.0–1.2)
Total Protein: 6.9 g/dL (ref 6.5–8.1)

## 2024-05-17 LAB — CBC WITH DIFFERENTIAL (CANCER CENTER ONLY)
Abs Immature Granulocytes: 0.07 K/uL (ref 0.00–0.07)
Basophils Absolute: 0.1 K/uL (ref 0.0–0.1)
Basophils Relative: 1 %
Eosinophils Absolute: 0.6 K/uL — ABNORMAL HIGH (ref 0.0–0.5)
Eosinophils Relative: 6 %
HCT: 29.6 % — ABNORMAL LOW (ref 36.0–46.0)
Hemoglobin: 9.7 g/dL — ABNORMAL LOW (ref 12.0–15.0)
Immature Granulocytes: 1 %
Lymphocytes Relative: 17 %
Lymphs Abs: 1.6 K/uL (ref 0.7–4.0)
MCH: 29.5 pg (ref 26.0–34.0)
MCHC: 32.8 g/dL (ref 30.0–36.0)
MCV: 90 fL (ref 80.0–100.0)
Monocytes Absolute: 0.7 K/uL (ref 0.1–1.0)
Monocytes Relative: 7 %
Neutro Abs: 6.7 K/uL (ref 1.7–7.7)
Neutrophils Relative %: 68 %
Platelet Count: 303 K/uL (ref 150–400)
RBC: 3.29 MIL/uL — ABNORMAL LOW (ref 3.87–5.11)
RDW: 15.9 % — ABNORMAL HIGH (ref 11.5–15.5)
WBC Count: 9.7 K/uL (ref 4.0–10.5)
nRBC: 0 % (ref 0.0–0.2)

## 2024-05-17 LAB — T4, FREE: Free T4: 0.88 ng/dL (ref 0.61–1.12)

## 2024-05-17 MED ORDER — PROCHLORPERAZINE MALEATE 10 MG PO TABS
10.0000 mg | ORAL_TABLET | Freq: Once | ORAL | Status: AC
  Administered 2024-05-17: 10 mg via ORAL
  Filled 2024-05-17: qty 1

## 2024-05-17 MED ORDER — SODIUM CHLORIDE 0.9 % IV SOLN
200.0000 mg | Freq: Once | INTRAVENOUS | Status: AC
  Administered 2024-05-17: 200 mg via INTRAVENOUS
  Filled 2024-05-17: qty 200

## 2024-05-17 MED ORDER — SODIUM CHLORIDE 0.9 % IV SOLN
1.0000 mg/kg | Freq: Once | INTRAVENOUS | Status: AC
  Administered 2024-05-17: 60 mg via INTRAVENOUS
  Filled 2024-05-17: qty 6

## 2024-05-17 MED ORDER — SODIUM CHLORIDE 0.9 % IV SOLN: INTRAVENOUS | Status: DC

## 2024-05-17 NOTE — Patient Instructions (Signed)
 [facility] - A DEPT OF [place]  Discharge Instructions: Thank you for choosing [facility] to provide your oncology and hematology care.   If you have a lab appointment with the Cancer Center, please go directly to the Cancer Center and check in at the registration area.   Wear comfortable clothing and clothing appropriate for easy access to any Portacath or PICC line.   We strive to give you quality time with your provider. You may need to reschedule your appointment if you arrive late (15 or more minutes).  Arriving late affects you and other patients whose appointments are after yours.  Also, if you miss three or more appointments without notifying the office, you may be dismissed from the clinic at the provider's discretion.      For prescription refill requests, have your pharmacy contact our office and allow 72 hours for refills to be completed.    Today you received the following chemotherapy and/or immunotherapy agents: Padcev  and Keytruda       To help prevent nausea and vomiting after your treatment, we encourage you to take your nausea medication as directed.  BELOW ARE SYMPTOMS THAT SHOULD BE REPORTED IMMEDIATELY: *FEVER GREATER THAN 100.4 F (38 C) OR HIGHER *CHILLS OR SWEATING *NAUSEA AND VOMITING THAT IS NOT CONTROLLED WITH YOUR NAUSEA MEDICATION *UNUSUAL SHORTNESS OF BREATH *UNUSUAL BRUISING OR BLEEDING *URINARY PROBLEMS (pain or burning when urinating, or frequent urination) *BOWEL PROBLEMS (unusual diarrhea, constipation, pain near the anus) TENDERNESS IN MOUTH AND THROAT WITH OR WITHOUT PRESENCE OF ULCERS (sore throat, sores in mouth, or a toothache) UNUSUAL RASH, SWELLING OR PAIN  UNUSUAL VAGINAL DISCHARGE OR ITCHING   Items with * indicate a potential emergency and should be followed up as soon as possible or go to the Emergency Department if any problems should occur.  Please show the CHEMOTHERAPY ALERT CARD or  IMMUNOTHERAPY ALERT CARD at check-in to the Emergency Department and triage nurse.  Should you have questions after your visit or need to cancel or reschedule your appointment, please contact [facility]  Dept: [phone]  and follow the prompts.  Office hours are 8:00 a.m. to 4:30 p.m. Monday - Friday. Please note that voicemails left after 4:00 p.m. may not be returned until the following business day.  We are closed weekends and major holidays. You have access to a nurse at all times for urgent questions. Please call the main number to the clinic Dept: [phone] and follow the prompts.   For any non-urgent questions, you may also contact your provider using MyChart. We now offer e-Visits for anyone 38 and older to request care online for non-urgent symptoms. For details visit [email or web address].   Also download the MyChart app! Go to the app store, search MyChart, open the app, select [place], and log in with your MyChart username and password.  Magnesium  Sulfate Injection What is this medication? MAGNESIUM  SULFATE (mag NEE zee um SUL fate) prevents and treats low levels of magnesium  in your body. It may also be used to prevent and treat seizures during pregnancy in people with high blood pressure disorders, such as preeclampsia or eclampsia. Magnesium  plays an important role in maintaining the health of your muscles and nervous system. This medicine may be used for other purposes; ask your health care provider or pharmacist if you have questions. What should I tell my care team  before I take this medication? They need to know if you have any of these conditions: Heart disease History of irregular heart beat Kidney disease An unusual or allergic reaction to magnesium  sulfate, medications, foods, dyes, or preservatives Pregnant or trying to get pregnant Breast-feeding How should I use this medication? This medication is  for infusion into a vein. It is given in a hospital or clinic setting. Talk to your care team about the use of this medication in children. While this medication may be prescribed for selected conditions, precautions do apply. Overdosage: If you think you have taken too much of this medicine contact a poison control center or emergency room at once. NOTE: This medicine is only for you. Do not share this medicine with others. What if I miss a dose? This does not apply. What may interact with this medication? Certain medications for anxiety or sleep Certain medications for seizures, such phenobarbital Digoxin Medications that relax muscles for surgery Narcotic medications for pain This list may not describe all possible interactions. Give your health care provider a list of all the medicines, herbs, non-prescription drugs, or dietary supplements you use. Also tell them if you smoke, drink alcohol, or use illegal drugs. Some items may interact with your medicine. What should I watch for while using this medication? Your condition will be monitored carefully while you are receiving this medication. You may need blood work done while you are receiving this medication. What side effects may I notice from receiving this medication? Side effects that you should report to your care team as soon as possible: Allergic reactions--skin rash, itching, hives, swelling of the face, lips, tongue, or throat High magnesium  level--confusion, drowsiness, facial flushing, redness, sweating, muscle weakness, fast or irregular heartbeat, trouble breathing Low blood pressure--dizziness, feeling faint or lightheaded, blurry vision Side effects that usually do not require medical attention (report to your care team if they continue or are bothersome): Headache Nausea This list may not describe all possible side effects. Call your doctor for medical advice about side effects. You may report side effects to FDA at  1-800-FDA-1088. Where should I keep my medication? This medication is given in a hospital or clinic and will not be stored at home. NOTE: This sheet is a summary. It may not cover all possible information. If you have questions about this medicine, talk to your doctor, pharmacist, or health care provider.  2024 Elsevier/Gold Standard (2021-04-25 00:00:00)

## 2024-05-18 ENCOUNTER — Other Ambulatory Visit: Payer: Self-pay | Admitting: Nurse Practitioner

## 2024-05-18 ENCOUNTER — Other Ambulatory Visit (HOSPITAL_COMMUNITY): Payer: Self-pay

## 2024-05-18 ENCOUNTER — Ambulatory Visit (INDEPENDENT_AMBULATORY_CARE_PROVIDER_SITE_OTHER): Admitting: Family Medicine

## 2024-05-18 ENCOUNTER — Other Ambulatory Visit: Payer: Self-pay

## 2024-05-18 ENCOUNTER — Encounter: Payer: Self-pay | Admitting: Family Medicine

## 2024-05-18 VITALS — BP 108/68 | HR 82 | Temp 97.9°F

## 2024-05-18 DIAGNOSIS — G8929 Other chronic pain: Secondary | ICD-10-CM

## 2024-05-18 DIAGNOSIS — Z515 Encounter for palliative care: Secondary | ICD-10-CM

## 2024-05-18 DIAGNOSIS — M069 Rheumatoid arthritis, unspecified: Secondary | ICD-10-CM

## 2024-05-18 DIAGNOSIS — Z9889 Other specified postprocedural states: Secondary | ICD-10-CM | POA: Insufficient documentation

## 2024-05-18 DIAGNOSIS — C7951 Secondary malignant neoplasm of bone: Secondary | ICD-10-CM | POA: Diagnosis not present

## 2024-05-18 DIAGNOSIS — F331 Major depressive disorder, recurrent, moderate: Secondary | ICD-10-CM

## 2024-05-18 DIAGNOSIS — G893 Neoplasm related pain (acute) (chronic): Secondary | ICD-10-CM

## 2024-05-18 DIAGNOSIS — J449 Chronic obstructive pulmonary disease, unspecified: Secondary | ICD-10-CM

## 2024-05-18 DIAGNOSIS — H66003 Acute suppurative otitis media without spontaneous rupture of ear drum, bilateral: Secondary | ICD-10-CM | POA: Insufficient documentation

## 2024-05-18 DIAGNOSIS — C679 Malignant neoplasm of bladder, unspecified: Secondary | ICD-10-CM

## 2024-05-18 DIAGNOSIS — Z95811 Presence of heart assist device: Secondary | ICD-10-CM | POA: Insufficient documentation

## 2024-05-18 MED ORDER — AMOXICILLIN-POT CLAVULANATE 500-125 MG PO TABS: 500.0000 mg | ORAL_TABLET | Freq: Two times a day (BID) | ORAL | 0 refills | Status: AC

## 2024-05-18 MED ORDER — METHADONE HCL 10 MG PO TABS
10.0000 mg | ORAL_TABLET | Freq: Three times a day (TID) | ORAL | 0 refills | Status: DC
  Filled 2024-05-18: qty 75, 25d supply, fill #0

## 2024-05-18 MED ORDER — FLUCONAZOLE 150 MG PO TABS: 150.0000 mg | ORAL_TABLET | Freq: Every day | ORAL | 0 refills | Status: AC

## 2024-05-18 NOTE — Assessment & Plan Note (Addendum)
 Chronic Worsened by metastatic cancer of bladder to spine Impaired mobility requiring power wheelchair  Limited mobility, primarily wheelchair-bound. Requires power wheelchair for improved mobility. - Send information to [facility]

## 2024-05-18 NOTE — Assessment & Plan Note (Signed)
 Active chemotherapy for malignancy Undergoing chemotherapy with occasional shortness of breath post-treatment. Recent blood work sufficient. - Monitor for shortness of breath post-chemotherapy.

## 2024-05-18 NOTE — Progress Notes (Addendum)
 Acute Office Visit  Subjective:    Patient ID: [name], female    DOB: [birth year], 68 y.o.   MRN: [id number]  Chief Complaint  Patient presents with   Ear Pain   Right ear pain   Discussed the use of AI scribe software for clinical note transcription with the patient, who gave verbal consent to proceed.  History of Present Illness   [name] is a 68 year old female who presents with ear pain.  Otalgia - Ear pain for a couple of weeks, primarily affecting the right ear - Described as a sensation of 'water 's in it' - No relief with Flonase   Respiratory symptoms - Occasional shortness of breath following chemotherapy - No chest pain - No persistent shortness of breath outside of chemotherapy - No allergy symptoms such as sneezing  Mobility and functional status - In the process of obtaining a wheelchair and a shower chair (transfer bench - over tub) to assist with mobility and showering. - Evaluation for POW - The patient presents today for continued need for mobility equipment. The patient is unable to perform the following Mobility-Related Activities of Daily Living (MRADLs) safely or within a normal time period at home: bathing, grooming, dressing, feeding, toileting, moving from room to room, meal prep, and home management. The patient is unable to complete ADLs inside the home with a cane, walker, or manual wheelchair for ambulation. The patient has the physical and mental abilities to operate a power wheelchair in his/her home. The patient is willing and motivated to use a power chair in the home. A scooter will not work because she is unable to maneuver in home due to space restrictions.   Past Medical History:  Diagnosis Date   Abnormality of gait due to impairment of balance 11/22/2020   Admission for long-term opiate analgesic use 10/24/2019   AKI (acute kidney injury) 11/14/2023   Anemia of chronic disease 06/07/2023   Arthralgia of both hands 04/07/2023    Arthritis of right hip 05/29/2016   Formatting of this note might be different from the original. Added automatically from request for surgery [id number]   Arthritis of right knee 11/27/2016   At risk for side effect of medication 12/18/2023   Bilateral lower extremity edema 06/07/2023   BMI 26.0-26.9,adult 03/29/2020   Bone lesion 11/18/2023   Cardiomyopathy (HCC)    Overview:  Ejection fraction 45% in 2015 Ejection fraction 30 to 35% in November 2018   Chronic active hepatitis (HCC) 01/04/2023   Chronic back pain    Chronic hypoxemic respiratory failure (HCC) 10/24/2019   Chronic narcotic use 03/23/2015   Chronic pain of both knees 12/21/2018   Added automatically from request for surgery [id number]  Formatting of this note might be different from the original. 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   Dyslipidemia 05/31/2015   Elevated serum glucose 06/23/2023   Fatigue 06/23/2023   Folate deficiency 01/08/2024   GERD (gastroesophageal reflux disease)    History of total hip replacement 09/25/2016   Hypoaldosteronism  07/13/2020   Hypotension 05/17/2020   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   Ileus following gastrointestinal surgery (HCC) 03/26/2015   Iron deficiency 11/21/2023   Low vitamin B12 level 11/21/2023   Major depressive disorder, single episode, moderate (HCC) 10/24/2019   Malnutrition of moderate degree 10/20/2020   Mesenteric ischemia 10/16/2020   Migraine without aura and without status migrainosus, not intractable 01/04/2023   Mixed hyperlipidemia 05/31/2015   Mixed incontinence 10/20/2020   Moderate protein-calorie malnutrition 11/03/2023   Nausea with vomiting    Nausea without vomiting 01/29/2024   Nephrostomy present (HCC) 12/01/2023   Neuropathy 01/29/2024   Normocytic anemia 06/23/2023   Osteoporosis 10/24/2019   Other spondylosis with radiculopathy, lumbar region 10/24/2019   Paraplegia (HCC) 11/14/2023   Presence of left artificial hip joint 10/24/2019   Primary insomnia 01/04/2023   PTSD (post-traumatic stress disorder)    Renal lesion 11/05/2023   Rheumatoid arthritis involving multiple sites (HCC) 04/07/2023   Senile osteoporosis 10/24/2019   Serosanguineous chronic otitis media of right ear 08/02/2021   Therapeutic opioid induced constipation 01/04/2023   Thyroid  disease    Urge incontinence of urine 11/03/2023   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 Small internal hemrrhoids. Otherwise normal colonoscopy   COLONOSCOPY WITH PROPOFOL  N/A 09/27/2021   Procedure: COLONOSCOPY WITH PROPOFOL ;  Surgeon: Shila Gustav GAILS, MD;  Location: [facility];  Service:  Endoscopy;  Laterality: N/A;   CORONARY ANGIOPLASTY     ESOPHAGOGASTRODUODENOSCOPY  02/04/2014   Mild gastritis. Otherwise normal EGD   HAND SURGERY Bilateral    ICD GENERATOR CHANGEOUT N/A 05/21/2019   Procedure: ICD GENERATOR CHANGEOUT;  Surgeon: Inocencio Soyla Lunger, MD;  Location[facility] INVASIVE CV LAB;  Service: Cardiovascular;  Laterality: N/A;   ICD IMPLANT     Medtronic   intestinal blockage 2011     IR IMAGING GUIDED PORT INSERTION  12/24/2023   IR NEPHROSTOMY PLACEMENT RIGHT  11/16/2023   IR URETERAL STENT PLACEMENT EXISTING ACCESS RIGHT  12/24/2023   LAPAROSCOPIC ASSISTED VENTRAL HERNIA REPAIR N/A 03/23/2015   Procedure: LAPAROSCOPIC VENTRAL WALL HERNIA REPAIR;  Surgeon: Elspeth Schultze, MD;  Location: [facility];  Service: General;  Laterality: N/A;  With MESH   LAPAROSCOPIC LYSIS OF ADHESIONS N/A 03/23/2015   Procedure: LAPAROSCOPIC LYSIS OF ADHESIONS;  Surgeon: Elspeth Schultze, MD;  Location: [facility];  Service: General;  Laterality: N/A;   LSCS      x2   NASAL SEPTUM SURGERY     NECK SURGERY     fused   TONSILLECTOMY     TRANSURETHRAL RESECTION OF BLADDER TUMOR N/A 11/16/2023   Procedure: TURBT (TRANSURETHRAL RESECTION OF BLADDER TUMOR);  Surgeon: [name] Lonni Righter, MD;  Location: [facility];  Service: Urology;  Laterality:  N/A;   TYMPANOSTOMY TUBE PLACEMENT Right 2024   ULNAR NERVE TRANSPOSITION  01/23/2012   Procedure: ULNAR NERVE DECOMPRESSION/TRANSPOSITION;  Surgeon: Reyes JONETTA Budge, MD;  Location: [facility];  Service: Neurosurgery;  Laterality: Left;  LEFT ulnar nerve decompression    Family History  Problem Relation Age of Onset   Hypertension Mother    Heart attack Mother    Alcohol abuse Father    Hypertension Father    Heart attack Father    Heart attack Other    Cancer Other    Heart failure Other    Anesthesia problems Neg Hx    Hypotension Neg Hx    Malignant hyperthermia Neg Hx    Pseudochol deficiency Neg Hx    Breast cancer Neg Hx     Social  History   Socioeconomic History   Marital status: Single    Spouse name: Not on file   Number of children: 3   Years of education: Not on file   Highest education level: 10th grade  Occupational History   Occupation: disabled  Tobacco Use   Smoking status: Former    Current packs/day: 0.00    Average packs/day: 0.5 packs/day for 30.0 years (15.0 ttl pk-yrs)    Types: Cigarettes    Start date: 03/1990    Quit date: 03/2020    Years since quitting: 4.1   Smokeless tobacco: Never  Vaping Use   Vaping status: Never Used  Substance and Sexual Activity   Alcohol use: Yes    Alcohol/week: 2.0 standard drinks of alcohol    Types: 1 Cans of beer, 1 Standard drinks or equivalent per week    Comment: seldom   Drug use: No   Sexual activity: Not Currently  Other Topics Concern   Not on file  Social History Narrative   One level home with boyfriend   Caffeine - coffee 1-2 cups/day; Green tea 4-5 bottles a day   Exercise - some    Right handed      Social Drivers of Health   Financial Resource Strain: Low Risk  (04/05/2023)   Overall Financial Resource Strain (CARDIA)    Difficulty of Paying Living Expenses: Not very hard  Food Insecurity: No Food Insecurity (11/15/2023)   Hunger Vital Sign    Worried About Running Out of Food in the Last Year: Never true    Ran Out of Food in the Last Year: Never true  Transportation Needs: Unmet Transportation Needs (11/15/2023)   PRAPARE - Administrator, Civil Service (Medical): Yes    Lack of Transportation (Non-Medical): No  Physical Activity: Insufficiently Active (04/05/2023)   Exercise Vital Sign    Days of Exercise per Week: 3 days    Minutes of Exercise per Session: 20 min  Stress: Stress Concern Present (04/05/2023)   [employer] of Occupational Health - Occupational Stress Questionnaire    Feeling of Stress : Very much  Social Connections: Moderately Isolated (11/15/2023)   Social Connection and Isolation Panel     Frequency of Communication with Friends and Family: More than three times a week    Frequency of Social Gatherings with Friends and Family: More than three times a week    Attends Religious Services: 1 to 4 times per year    Active Member of [employer] or Organizations: No    Attends Banker Meetings: Never    Marital Status: Divorced  Catering manager Violence: Not At Risk (11/15/2023)   Humiliation, Afraid, Rape,  and Kick questionnaire    Fear of Current or Ex-Partner: No    Emotionally Abused: No    Physically Abused: No    Sexually Abused: No    Outpatient Medications Prior to Visit  Medication Sig Dispense Refill   ARIPiprazole  (ABILIFY ) 5 MG tablet TAKE 1 TABLET BY MOUTH DAILY 30 tablet 10   aspirin  81 MG tablet Take 81 mg by mouth daily.     BREO ELLIPTA  100-25 MCG/ACT AEPB INHALE 1 PUFF INTO THE LUNGS DAILY. (Patient taking differently: Inhale 1 puff into the lungs daily as needed (SOB).) 60 each 10   Carboxymethylcell-Glycerin  PF 0.5-0.9 % SOLN Place 2 drops into both eyes 4 (four) times daily. 1 each 11   carvedilol  (COREG ) 3.125 MG tablet TAKE ONE (1) TABLET BY MOUTH TWICE DAILY WITH MEALS (Patient taking differently: Take 3.125 mg by mouth 2 (two) times daily with a meal.) 60 tablet 10   denosumab  (PROLIA ) 60 MG/ML SOSY injection Inject 60 mg into the skin every 6 (six) months. 180 mL 2   dextromethorphan  15 MG/5ML syrup Take 10 mLs (30 mg total) by mouth 4 (four) times daily as needed for cough. 120 mL 0   diclofenac  Sodium (VOLTAREN ) 1 % GEL Apply 2 g topically 4 (four) times daily. 50 g 4   diphenoxylate -atropine  (LOMOTIL ) 2.5-0.025 MG tablet Take 1 tablet by mouth 4 (four) times daily as needed for diarrhea or loose stools. 60 tablet 0   dronabinol  (MARINOL ) 5 MG capsule Take 1 capsule (5 mg total) by mouth 2 (two) times daily before lunch and supper. 60 capsule 0   EPINEPHRINE  0.3 mg/0.3 mL IJ SOAJ injection Inject 0.3 mLs (0.3 mg total) into the muscle as needed for  anaphylaxis. 1 each 2   FARXIGA 5 MG TABS tablet Take 5 mg by mouth daily.     ferrous sulfate  325 (65 FE) MG tablet Take 1 tablet (325 mg total) by mouth at bedtime.     fluticasone  (FLONASE ) 50 MCG/ACT nasal spray USE 2 SPRAYS IN EACH NOSTRILS DAILY AS NEEDED (Patient taking differently: Place 2 sprays into both nostrils daily as needed for allergies or rhinitis.) 48 g 0   folic acid  (FOLVITE ) 1 MG tablet Take 1 tablet (1 mg total) by mouth daily.     furosemide  (LASIX ) 20 MG tablet Take 40 mg by mouth daily.     ipratropium-albuterol  (DUONEB) 0.5-2.5 (3) MG/3ML SOLN USE 1 VIAL VIA NEBULIZER EVERY 6 HOURS AS NEEDED FOR SHORTNESS OF BREATH (Patient taking differently: Take 3 mLs by nebulization every 6 (six) hours as needed (SOB).) 90 mL 0   levocetirizine (XYZAL) 5 MG tablet Take 5 mg by mouth daily as needed for allergies.     lidocaine  (LIDODERM ) 5 % Place 1 patch onto the skin daily. Remove & Discard patch within 12 hours or as directed by MD     lidocaine -prilocaine  (EMLA ) cream Apply to affected area once (Patient taking differently: Apply 1 Application topically once. Apply to affected area once) 30 g 3   lubiprostone  (AMITIZA ) 24 MCG capsule TAKE 1 CAPSULE(24 MCG) BY MOUTH TWICE DAILY WITH A MEAL (Patient taking differently: Take 24 mcg by mouth 2 (two) times daily with a meal.) 180 capsule 1   methadone  (DOLOPHINE ) 10 MG tablet Take 1 tablet (10 mg total) by mouth every 8 (eight) hours. 75 tablet 0   methylPREDNISolone  (MEDROL  DOSEPAK) 4 MG TBPK tablet If significant rash, will start at 6 tabs on day 1, then 5 tabs  on day 2, then 4 tabs on day 3, then 3 tabs on day 4, then 2 tabs on day 5 and then 1 tab on day 6. Take with food. 21 tablet 0   metoCLOPramide  (REGLAN ) 10 MG tablet Take 1 tablet (10 mg total) by mouth every 8 (eight) hours as needed for nausea. 45 tablet 1   Multiple Vitamin (MULTIVITAMIN WITH MINERALS) TABS tablet Take 1 tablet by mouth daily.     naloxone  (NARCAN ) nasal spray  4 mg/0.1 mL Place 1 spray into the nose once.     nitroGLYCERIN  (NITROSTAT ) 0.4 MG SL tablet Place 1 tablet (0.4 mg total) under the tongue every 5 (five) minutes as needed for chest pain. 25 tablet 6   ondansetron  (ZOFRAN ) 4 MG tablet Take 1 tablet (4 mg total) by mouth every 8 (eight) hours as needed for nausea or vomiting. 20 tablet 0   ondansetron  (ZOFRAN ) 8 MG tablet Take 1 tablet (8 mg total) by mouth every 8 (eight) hours as needed for nausea or vomiting. 30 tablet 1   Oxycodone  HCl 20 MG TABS Take 1 tablet (20 mg total) by mouth every 4 (four) hours as needed. 90 tablet 0   potassium chloride  (KLOR-CON  M) 10 MEQ tablet Take 2 tablets (20 mEq total) by mouth 2 (two) times daily. 180 tablet 1   prochlorperazine  (COMPAZINE ) 10 MG tablet TAKE 1 TABLET(10 MG) BY MOUTH EVERY 6 HOURS AS NEEDED FOR NAUSEA OR VOMITING 30 tablet 3   ranolazine  (RANEXA ) 500 MG 12 hr tablet TAKE 1 TABLET BY MOUTH TWICE DAILY 60 tablet 10   rosuvastatin  (CRESTOR ) 20 MG tablet TAKE 1 TABLET BY MOUTH ONCE DAILY 90 tablet 1   sertraline  (ZOLOFT ) 100 MG tablet TAKE 1/2 TABLET(50 MG) BY MOUTH DAILY (Patient taking differently: Take 50 mg by mouth daily.) 45 tablet 1   sulfamethoxazole -trimethoprim  (BACTRIM  DS) 800-160 MG tablet Take 1 tablet by mouth 2 (two) times daily. 14 tablet 0   tiZANidine  (ZANAFLEX ) 4 MG tablet Take 1 tablet (4 mg total) by mouth at bedtime. 30 tablet 3   topiramate  (TOPAMAX ) 50 MG tablet TAKE TWO (2) TABLETS BY MOUTH EVERY DAY AT BEDTIME (Patient taking differently: Take 50 mg by mouth at bedtime. TAKE TWO (2) TABLETS BY MOUTH EVERY DAY AT BEDTIME) 60 tablet 10   Ubrogepant  (UBRELVY ) 100 MG TABS Take 1 tablet (100 mg total) by mouth as needed (May repeat after 2 hours.  Maximum 2 tablets in 24 hours). TAKE 1 TABLET BY MOUTH BY MOUTH AS NEEDED( MAY REPEAT 1 TABLET AFTER 2 HOURS IF NEEDED, MAXIMUM 2 TABLETS IN 24 HOURS) Strength: 100 mg 48 tablet 1   Vitamin D , Ergocalciferol , (DRISDOL ) 1.25 MG (50000  UNIT) CAPS capsule TAKE 1 CAPSULE BY MOUTH EVERY 7 DAYS FOR 12 DOSES (Patient taking differently: Take 50,000 Units by mouth every 7 (seven) days. Patient usually takes this medication on mondays) 12 capsule 0   ENTRESTO  24-26 MG TAKE ONE (1) TABLET BY MOUTH TWICE DAILY 60 tablet 10   esomeprazole  (NEXIUM ) 20 MG capsule TAKE ONE (1) CAPSULE BY MOUTH ONCE DAILY 90 capsule 3   No facility-administered medications prior to visit.    Allergies  Allergen Reactions   Lyrica  [Pregabalin ] Other (See Comments)    Jerking, twitching, memory changes     Review of Systems  Constitutional:  Negative for chills, diaphoresis, fatigue and fever.  HENT:  Positive for ear pain (right ear). Negative for congestion, sinus pain and sore throat.  Respiratory:  Negative for cough and shortness of breath.   Cardiovascular:  Negative for chest pain.  Gastrointestinal:  Negative for abdominal pain, constipation, nausea and vomiting.  Genitourinary:  Negative for dysuria.  Musculoskeletal:  Negative for arthralgias.  Neurological:  Negative for weakness and headaches.  Psychiatric/Behavioral:  Negative for dysphoric mood. The patient is not nervous/anxious.        Objective:        05/18/2024    1:47 PM 05/17/2024    2:19 PM 05/03/2024    3:14 PM  Vitals with BMI  Weight  124 lbs 9 oz   BMI  26.04   Systolic 108 119 889  Diastolic 68 80 69  Pulse 82 85 92    Orthostatic VS for the past 72 hrs (Last 3 readings):  Patient Position BP Location  05/18/24 1347 Sitting Right Arm     Physical Exam Vitals reviewed.  Constitutional:      General: She is not in acute distress.    Appearance: Normal appearance. She is ill-appearing.  HENT:     Right Ear: A middle ear effusion is present. Tympanic membrane is erythematous.     Left Ear: Tympanic membrane is erythematous.  Eyes:     Conjunctiva/sclera: Conjunctivae normal.  Cardiovascular:     Rate and Rhythm: Normal rate and regular rhythm.      Heart sounds: Normal heart sounds. No murmur heard. Pulmonary:     Effort: Pulmonary effort is normal.     Breath sounds: Normal breath sounds. No wheezing.  Abdominal:     Palpations: Abdomen is soft.  Neurological:     Mental Status: She is alert. Mental status is at baseline.  Psychiatric:        Mood and Affect: Mood normal.        Behavior: Behavior normal.     Health Maintenance Due  Topic Date Due   Zoster Vaccines- Shingrix (1 of 2) Never done    There are no preventive care reminders to display for this patient.   Lab Results  Component Value Date   TSH 1.490 01/29/2024   Lab Results  Component Value Date   WBC 9.7 05/17/2024   HGB 9.7 (L) 05/17/2024   HCT 29.6 (L) 05/17/2024   MCV 90.0 05/17/2024   PLT 303 05/17/2024   Lab Results  Component Value Date   NA 139 05/17/2024   K 4.1 05/17/2024   CO2 26 05/17/2024   GLUCOSE 93 05/17/2024   BUN 33 (H) 05/17/2024   CREATININE 1.61 (H) 05/17/2024   BILITOT 0.3 05/17/2024   ALKPHOS 52 05/17/2024   AST 14 (L) 05/17/2024   ALT 8 05/17/2024   PROT 6.9 05/17/2024   ALBUMIN  3.4 (L) 05/17/2024   CALCIUM  9.5 05/17/2024   ANIONGAP 6 05/17/2024   EGFR 27.0 11/03/2023   Lab Results  Component Value Date   CHOL 231 (H) 09/25/2023   Lab Results  Component Value Date   HDL 87 09/25/2023   Lab Results  Component Value Date   LDLCALC 124 (H) 09/25/2023   Lab Results  Component Value Date   TRIG 117 09/25/2023   Lab Results  Component Value Date   CHOLHDL 2.7 09/25/2023   Lab Results  Component Value Date   HGBA1C 5.5 06/23/2023        Results for orders placed or performed in visit on 05/17/24  CMP (Cancer Center only)   Collection Time: 05/17/24  1:53 PM  Result Value Ref Range  Sodium 139 135 - 145 mmol/L   Potassium 4.1 3.5 - 5.1 mmol/L   Chloride 107 98 - 111 mmol/L   CO2 26 22 - 32 mmol/L   Glucose, Bld 93 70 - 99 mg/dL   BUN 33 (H) 8 - 23 mg/dL   Creatinine 8.38 (H) 9.55 - 1.00 mg/dL    Calcium  9.5 8.9 - 10.3 mg/dL   Total Protein 6.9 6.5 - 8.1 g/dL   Albumin  3.4 (L) 3.5 - 5.0 g/dL   AST 14 (L) 15 - 41 U/L   ALT 8 0 - 44 U/L   Alkaline Phosphatase 52 38 - 126 U/L   Total Bilirubin 0.3 0.0 - 1.2 mg/dL   GFR, Estimated 34 (L) >60 mL/min   Anion gap 6 5 - 15  CBC with Differential (Cancer Center Only)   Collection Time: 05/17/24  1:53 PM  Result Value Ref Range   WBC Count 9.7 4.0 - 10.5 K/uL   RBC 3.29 (L) 3.87 - 5.11 MIL/uL   Hemoglobin 9.7 (L) 12.0 - 15.0 g/dL   HCT 70.3 (L) 63.9 - 53.9 %   MCV 90.0 80.0 - 100.0 fL   MCH 29.5 26.0 - 34.0 pg   MCHC 32.8 30.0 - 36.0 g/dL   RDW 84.0 (H) 88.4 - 84.4 %   Platelet Count 303 150 - 400 K/uL   nRBC 0.0 0.0 - 0.2 %   Neutrophils Relative % 68 %   Neutro Abs 6.7 1.7 - 7.7 K/uL   Lymphocytes Relative 17 %   Lymphs Abs 1.6 0.7 - 4.0 K/uL   Monocytes Relative 7 %   Monocytes Absolute 0.7 0.1 - 1.0 K/uL   Eosinophils Relative 6 %   Eosinophils Absolute 0.6 (H) 0.0 - 0.5 K/uL   Basophils Relative 1 %   Basophils Absolute 0.1 0.0 - 0.1 K/uL   Immature Granulocytes 1 %   Abs Immature Granulocytes 0.07 0.00 - 0.07 K/uL  T4, free   Collection Time: 05/17/24  1:53 PM  Result Value Ref Range   Free T4 0.88 0.61 - 1.12 ng/dL     Assessment & Plan:   Assessment & Plan Non-recurrent acute suppurative otitis media of both ears without spontaneous rupture of tympanic membranes Chronic otitis media with effusion, right ear worse than left Chronic otitis media with effusion in the right ear with fluid sensation and mild pain. Flonase  ineffective. No antibiotic allergies. - Prescribe antibiotic for ear infection. - Continue to take Flonase  and educated on proper technique - Sent Diflucan  to pharmacy due to history of yeast infection following antibiotics. Advise to avoid taking Diflucan  and methadone  together due to potential QT interval prolongation.  Orders:   fluconazole  (DIFLUCAN ) 150 MG tablet; Take 1 tablet (150 mg  total) by mouth daily for 1 dose.   amoxicillin -clavulanate (AUGMENTIN ) 500-125 MG tablet; Take 1 tablet by mouth in the morning and at bedtime for 5 days.  Cancer, metastatic to bone [facility]) Active chemotherapy for malignancy Undergoing chemotherapy with occasional shortness of breath post-treatment. Recent blood work sufficient. - Monitor for shortness of breath post-chemotherapy.     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 Requires power wheelchair for improved mobility. - Send information to [facility]     Chronic obstructive pulmonary disease, unspecified COPD type (HCC) Stable Shortness of breath related to post chemotherapy. - Continue taking Breo  Ellipta 100-25 and DuoNeb as needed - Continue Xyzal and Flonase  as needed for allergy symptoms    Depression, major, recurrent, moderate (HCC) Improved    05/17/2024    2:27 PM 09/25/2023    2:39 PM 06/02/2023    4:01 PM  PHQ9 SCORE ONLY  PHQ-9 Total Score 0 11  10      Data saved with a previous flowsheet row definition  - Continue Abilify  to 5 mg before bed continue Zoloft  at 50 mg daily      Follow-up: Return in about 3 weeks (around 06/08/2024) for nurse visit (Flu vaccine).  An After Visit Summary was printed and given to the patient.  Harrie CHRISTELLA Cedar, FNP [facility] [phone]

## 2024-05-18 NOTE — Assessment & Plan Note (Addendum)
Folic acid 1mg daily

## 2024-05-18 NOTE — Progress Notes (Deleted)
 Acute Office Visit  Subjective:    Patient ID: [name], female    DOB: [birth year], 68 y.o.   MRN: [id number]  Chief Complaint  Patient presents with   Ear Pain   Right ear pain    HPI: Patient is in today for ***  Discussed the use of AI scribe software for clinical note transcription with the patient, who gave verbal consent to proceed.      Past Medical History:  Diagnosis Date   Abnormality of gait due to impairment of balance 11/22/2020   Admission for long-term opiate analgesic use 10/24/2019   AKI (acute kidney injury) 11/14/2023   Anemia of chronic disease 06/07/2023   Arthralgia of both hands 04/07/2023   Arthritis of right hip 05/29/2016   Formatting of this note might be different from the original. Added automatically from request for surgery [id number]   Arthritis of right knee 11/27/2016   At risk for side effect of medication 12/18/2023   Bilateral lower extremity edema 06/07/2023   BMI 26.0-26.9,adult 03/29/2020   Bone lesion 11/18/2023   Cardiomyopathy (HCC)    Overview:  Ejection fraction 45% in 2015 Ejection fraction 30 to 35% in November 2018   Chronic active hepatitis (HCC) 01/04/2023   Chronic back pain    Chronic hypoxemic respiratory failure (HCC) 10/24/2019   Chronic narcotic use 03/23/2015   Chronic pain of both knees 12/21/2018   Added automatically from request for surgery [id number]  Formatting of this note might be different from the original. 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   Dyslipidemia 05/31/2015   Elevated serum glucose 06/23/2023   Fatigue 06/23/2023   Folate deficiency 01/08/2024   GERD (gastroesophageal reflux disease)    History of total hip replacement 09/25/2016   Hypoaldosteronism 07/13/2020   Hypotension 05/17/2020   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   Ileus following gastrointestinal surgery (HCC) 03/26/2015   Iron deficiency 11/21/2023   Low vitamin B12 level 11/21/2023   Major depressive disorder, single episode, moderate (HCC) 10/24/2019   Malnutrition of moderate degree 10/20/2020   Mesenteric ischemia 10/16/2020   Migraine without aura and without status migrainosus, not intractable 01/04/2023   Mixed hyperlipidemia 05/31/2015   Mixed incontinence 10/20/2020   Moderate protein-calorie malnutrition 11/03/2023   Nausea with vomiting    Nausea without vomiting 01/29/2024   Nephrostomy present (HCC) 12/01/2023   Neuropathy 01/29/2024   Normocytic anemia 06/23/2023   Osteoporosis 10/24/2019   Other spondylosis with radiculopathy, lumbar region 10/24/2019   Paraplegia (HCC) 11/14/2023   Presence of left artificial hip joint 10/24/2019   Primary insomnia 01/04/2023   PTSD (post-traumatic stress disorder)    Renal lesion 11/05/2023   Rheumatoid arthritis  involving multiple sites (HCC) 04/07/2023   Senile osteoporosis 10/24/2019   Serosanguineous chronic otitis media of right ear 08/02/2021   Therapeutic opioid induced constipation 01/04/2023   Thyroid  disease    Urge incontinence of urine  11/03/2023   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 Small internal hemrrhoids. Otherwise normal colonoscopy   COLONOSCOPY WITH PROPOFOL  N/A 09/27/2021   Procedure: COLONOSCOPY WITH PROPOFOL ;  Surgeon: Shila Gustav GAILS, MD;  Location: [facility];  Service: Endoscopy;  Laterality: N/A;   CORONARY ANGIOPLASTY     ESOPHAGOGASTRODUODENOSCOPY  02/04/2014   Mild gastritis. Otherwise normal EGD   HAND SURGERY Bilateral    ICD GENERATOR CHANGEOUT N/A 05/21/2019   Procedure: ICD GENERATOR CHANGEOUT;  Surgeon: Inocencio Soyla Lunger, MD;  Location: [facility] CV [facility];  Service: Cardiovascular;  Laterality: N/A;   ICD IMPLANT     Medtronic   intestinal blockage 2011     IR IMAGING GUIDED PORT INSERTION  12/24/2023   IR NEPHROSTOMY PLACEMENT RIGHT  11/16/2023   IR URETERAL STENT PLACEMENT EXISTING ACCESS RIGHT  12/24/2023   LAPAROSCOPIC ASSISTED VENTRAL HERNIA REPAIR N/A 03/23/2015   Procedure: LAPAROSCOPIC VENTRAL WALL HERNIA REPAIR;  Surgeon: Elspeth Schultze, MD;  Location: [facility];  Service: General;  Laterality: N/A;  With MESH   LAPAROSCOPIC LYSIS OF ADHESIONS N/A 03/23/2015   Procedure: LAPAROSCOPIC LYSIS OF ADHESIONS;  Surgeon: Elspeth Schultze, MD;  Location: [facility];  Service: General;  Laterality: N/A;   LSCS      x2   NASAL SEPTUM SURGERY     NECK SURGERY     fused   TONSILLECTOMY     TRANSURETHRAL RESECTION OF BLADDER TUMOR N/A 11/16/2023   Procedure: TURBT (TRANSURETHRAL RESECTION OF BLADDER TUMOR);  Surgeon: [name] Lonni Righter, MD;  Location: [facility];  Service: Urology;  Laterality: N/A;   TYMPANOSTOMY TUBE  PLACEMENT Right 2024   ULNAR NERVE TRANSPOSITION  01/23/2012   Procedure: ULNAR NERVE DECOMPRESSION/TRANSPOSITION;  Surgeon: Reyes JONETTA Budge, MD;  Location: [facility];  Service: Neurosurgery;  Laterality: Left;  LEFT ulnar nerve decompression    Family History  Problem Relation Age of Onset   Hypertension Mother    Heart attack Mother    Alcohol abuse Father    Hypertension Father    Heart attack Father    Heart attack Other    Cancer Other    Heart failure Other    Anesthesia problems Neg Hx    Hypotension Neg Hx    Malignant hyperthermia Neg Hx    Pseudochol deficiency Neg Hx    Breast cancer Neg Hx     Social History   Socioeconomic History   Marital status: Single    Spouse name: Not on file   Number of children: 3   Years of education: Not on file   Highest education level: 10th grade  Occupational History   Occupation: disabled  Tobacco Use   Smoking status: Former    Current packs/day: 0.00    Average packs/day: 0.5 packs/day for 30.0 years (15.0 ttl pk-yrs)    Types: Cigarettes  Start date: 03/1990    Quit date: 03/2020    Years since quitting: 4.1   Smokeless tobacco: Never  Vaping Use   Vaping status: Never Used  Substance and Sexual Activity   Alcohol use: Yes    Alcohol/week: 2.0 standard drinks of alcohol    Types: 1 Cans of beer, 1 Standard drinks or equivalent per week    Comment: seldom   Drug use: No   Sexual activity: Not Currently  Other Topics Concern   Not on file  Social History Narrative   One level home with boyfriend   Caffeine - coffee 1-2 cups/day; Green tea 4-5 bottles a day   Exercise - some    Right handed      Social Drivers of Health   Financial Resource Strain: Low Risk  (04/05/2023)   Overall Financial Resource Strain (CARDIA)    Difficulty of Paying Living Expenses: Not very hard  Food Insecurity: No Food Insecurity (11/15/2023)   Hunger Vital Sign    Worried About Running Out of Food in the Last Year: Never  true    Ran Out of Food in the Last Year: Never true  Transportation Needs: Unmet Transportation Needs (11/15/2023)   PRAPARE - Administrator, Civil Service (Medical): Yes    Lack of Transportation (Non-Medical): No  Physical Activity: Insufficiently Active (04/05/2023)   Exercise Vital Sign    Days of Exercise per Week: 3 days    Minutes of Exercise per Session: 20 min  Stress: Stress Concern Present (04/05/2023)   [employer] of Occupational Health - Occupational Stress Questionnaire    Feeling of Stress : Very much  Social Connections: Moderately Isolated (11/15/2023)   Social Connection and Isolation Panel    Frequency of Communication with Friends and Family: More than three times a week    Frequency of Social Gatherings with Friends and Family: More than three times a week    Attends Religious Services: 1 to 4 times per year    Active Member of [employer] or Organizations: No    Attends Banker Meetings: Never    Marital Status: Divorced  Catering manager Violence: Not At Risk (11/15/2023)   Humiliation, Afraid, Rape, and Kick questionnaire    Fear of Current or Ex-Partner: No    Emotionally Abused: No    Physically Abused: No    Sexually Abused: No    Outpatient Medications Prior to Visit  Medication Sig Dispense Refill   ARIPiprazole  (ABILIFY ) 5 MG tablet TAKE 1 TABLET BY MOUTH DAILY 30 tablet 10   aspirin  81 MG tablet Take 81 mg by mouth daily.     BREO ELLIPTA  100-25 MCG/ACT AEPB INHALE 1 PUFF INTO THE LUNGS DAILY. (Patient taking differently: Inhale 1 puff into the lungs daily as needed (SOB).) 60 each 10   Carboxymethylcell-Glycerin  PF 0.5-0.9 % SOLN Place 2 drops into both eyes 4 (four) times daily. 1 each 11   carvedilol  (COREG ) 3.125 MG tablet TAKE ONE (1) TABLET BY MOUTH TWICE DAILY WITH MEALS (Patient taking differently: Take 3.125 mg by mouth 2 (two) times daily with a meal.) 60 tablet 10   denosumab  (PROLIA ) 60 MG/ML SOSY injection Inject 60  mg into the skin every 6 (six) months. 180 mL 2   dextromethorphan  15 MG/5ML syrup Take 10 mLs (30 mg total) by mouth 4 (four) times daily as needed for cough. 120 mL 0   diclofenac  Sodium (VOLTAREN ) 1 % GEL Apply 2 g topically 4 (four)  times daily. 50 g 4   diphenoxylate -atropine  (LOMOTIL ) 2.5-0.025 MG tablet Take 1 tablet by mouth 4 (four) times daily as needed for diarrhea or loose stools. 60 tablet 0   dronabinol  (MARINOL ) 5 MG capsule Take 1 capsule (5 mg total) by mouth 2 (two) times daily before lunch and supper. 60 capsule 0   EPINEPHRINE  0.3 mg/0.3 mL IJ SOAJ injection Inject 0.3 mLs (0.3 mg total) into the muscle as needed for anaphylaxis. 1 each 2   FARXIGA 5 MG TABS tablet Take 5 mg by mouth daily.     ferrous sulfate  325 (65 FE) MG tablet Take 1 tablet (325 mg total) by mouth at bedtime.     fluticasone  (FLONASE ) 50 MCG/ACT nasal spray USE 2 SPRAYS IN EACH NOSTRILS DAILY AS NEEDED (Patient taking differently: Place 2 sprays into both nostrils daily as needed for allergies or rhinitis.) 48 g 0   folic acid  (FOLVITE ) 1 MG tablet Take 1 tablet (1 mg total) by mouth daily.     furosemide  (LASIX ) 20 MG tablet Take 40 mg by mouth daily.     ipratropium-albuterol  (DUONEB) 0.5-2.5 (3) MG/3ML SOLN USE 1 VIAL VIA NEBULIZER EVERY 6 HOURS AS NEEDED FOR SHORTNESS OF BREATH (Patient taking differently: Take 3 mLs by nebulization every 6 (six) hours as needed (SOB).) 90 mL 0   levocetirizine (XYZAL) 5 MG tablet Take 5 mg by mouth daily as needed for allergies.     lidocaine  (LIDODERM ) 5 % Place 1 patch onto the skin daily. Remove & Discard patch within 12 hours or as directed by MD     lidocaine -prilocaine  (EMLA ) cream Apply to affected area once (Patient taking differently: Apply 1 Application topically once. Apply to affected area once) 30 g 3   lubiprostone  (AMITIZA ) 24 MCG capsule TAKE 1 CAPSULE(24 MCG) BY MOUTH TWICE DAILY WITH A MEAL (Patient taking differently: Take 24 mcg by mouth 2 (two) times  daily with a meal.) 180 capsule 1   methadone  (DOLOPHINE ) 10 MG tablet Take 1 tablet (10 mg total) by mouth every 8 (eight) hours. 75 tablet 0   methylPREDNISolone  (MEDROL  DOSEPAK) 4 MG TBPK tablet If significant rash, will start at 6 tabs on day 1, then 5 tabs on day 2, then 4 tabs on day 3, then 3 tabs on day 4, then 2 tabs on day 5 and then 1 tab on day 6. Take with food. 21 tablet 0   metoCLOPramide  (REGLAN ) 10 MG tablet Take 1 tablet (10 mg total) by mouth every 8 (eight) hours as needed for nausea. 45 tablet 1   Multiple Vitamin (MULTIVITAMIN WITH MINERALS) TABS tablet Take 1 tablet by mouth daily.     naloxone  (NARCAN ) nasal spray 4 mg/0.1 mL Place 1 spray into the nose once.     nitroGLYCERIN  (NITROSTAT ) 0.4 MG SL tablet Place 1 tablet (0.4 mg total) under the tongue every 5 (five) minutes as needed for chest pain. 25 tablet 6   ondansetron  (ZOFRAN ) 4 MG tablet Take 1 tablet (4 mg total) by mouth every 8 (eight) hours as needed for nausea or vomiting. 20 tablet 0   ondansetron  (ZOFRAN ) 8 MG tablet Take 1 tablet (8 mg total) by mouth every 8 (eight) hours as needed for nausea or vomiting. 30 tablet 1   Oxycodone  HCl 20 MG TABS Take 1 tablet (20 mg total) by mouth every 4 (four) hours as needed. 90 tablet 0   potassium chloride  (KLOR-CON  M) 10 MEQ tablet Take 2 tablets (20 mEq  total) by mouth 2 (two) times daily. 180 tablet 1   prochlorperazine  (COMPAZINE ) 10 MG tablet TAKE 1 TABLET(10 MG) BY MOUTH EVERY 6 HOURS AS NEEDED FOR NAUSEA OR VOMITING 30 tablet 3   ranolazine  (RANEXA ) 500 MG 12 hr tablet TAKE 1 TABLET BY MOUTH TWICE DAILY 60 tablet 10   rosuvastatin  (CRESTOR ) 20 MG tablet TAKE 1 TABLET BY MOUTH ONCE DAILY 90 tablet 1   sertraline  (ZOLOFT ) 100 MG tablet TAKE 1/2 TABLET(50 MG) BY MOUTH DAILY (Patient taking differently: Take 50 mg by mouth daily.) 45 tablet 1   sulfamethoxazole -trimethoprim  (BACTRIM  DS) 800-160 MG tablet Take 1 tablet by mouth 2 (two) times daily. 14 tablet 0    tiZANidine  (ZANAFLEX ) 4 MG tablet Take 1 tablet (4 mg total) by mouth at bedtime. 30 tablet 3   topiramate  (TOPAMAX ) 50 MG tablet TAKE TWO (2) TABLETS BY MOUTH EVERY DAY AT BEDTIME (Patient taking differently: Take 50 mg by mouth at bedtime. TAKE TWO (2) TABLETS BY MOUTH EVERY DAY AT BEDTIME) 60 tablet 10   Ubrogepant  (UBRELVY ) 100 MG TABS Take 1 tablet (100 mg total) by mouth as needed (May repeat after 2 hours.  Maximum 2 tablets in 24 hours). TAKE 1 TABLET BY MOUTH BY MOUTH AS NEEDED( MAY REPEAT 1 TABLET AFTER 2 HOURS IF NEEDED, MAXIMUM 2 TABLETS IN 24 HOURS) Strength: 100 mg 48 tablet 1   Vitamin D , Ergocalciferol , (DRISDOL ) 1.25 MG (50000 UNIT) CAPS capsule TAKE 1 CAPSULE BY MOUTH EVERY 7 DAYS FOR 12 DOSES (Patient taking differently: Take 50,000 Units by mouth every 7 (seven) days. Patient usually takes this medication on mondays) 12 capsule 0   ENTRESTO  24-26 MG TAKE ONE (1) TABLET BY MOUTH TWICE DAILY 60 tablet 10   esomeprazole  (NEXIUM ) 20 MG capsule TAKE ONE (1) CAPSULE BY MOUTH ONCE DAILY 90 capsule 3   No facility-administered medications prior to visit.    Allergies  Allergen Reactions   Lyrica  Gerardo.Gerald ] Other (See Comments)    Jerking, twitching, memory changes     Review of Systems  Constitutional:  Negative for appetite change, fatigue and fever.  HENT:  Positive for ear pain (Right ear pain). Negative for congestion, sinus pressure and sore throat.   Respiratory:  Negative for cough, chest tightness, shortness of breath and wheezing.   Cardiovascular:  Negative for chest pain and palpitations.  Gastrointestinal:  Negative for abdominal pain, constipation, diarrhea, nausea and vomiting.  Genitourinary:  Negative for dysuria and hematuria.  Musculoskeletal:  Negative for arthralgias, back pain, joint swelling and myalgias.  Skin:  Negative for rash.  Neurological:  Negative for dizziness, weakness and headaches.  Psychiatric/Behavioral:  Negative for dysphoric mood. The  patient is not nervous/anxious.        Objective:        05/18/2024    1:47 PM 05/17/2024    2:19 PM 05/03/2024    3:14 PM  Vitals with BMI  Weight  124 lbs 9 oz   BMI  26.04   Systolic 108 119 889  Diastolic 68 80 69  Pulse 82 85 92    Orthostatic VS for the past 72 hrs (Last 3 readings):  Patient Position BP Location  05/18/24 1347 Sitting Right Arm     Physical Exam  Health Maintenance Due  Topic Date Due   Zoster Vaccines- Shingrix (1 of 2) Never done    There are no preventive care reminders to display for this patient.   Lab Results  Component Value Date  TSH 1.490 01/29/2024   Lab Results  Component Value Date   WBC 9.7 05/17/2024   HGB 9.7 (L) 05/17/2024   HCT 29.6 (L) 05/17/2024   MCV 90.0 05/17/2024   PLT 303 05/17/2024   Lab Results  Component Value Date   NA 139 05/17/2024   K 4.1 05/17/2024   CO2 26 05/17/2024   GLUCOSE 93 05/17/2024   BUN 33 (H) 05/17/2024   CREATININE 1.61 (H) 05/17/2024   BILITOT 0.3 05/17/2024   ALKPHOS 52 05/17/2024   AST 14 (L) 05/17/2024   ALT 8 05/17/2024   PROT 6.9 05/17/2024   ALBUMIN  3.4 (L) 05/17/2024   CALCIUM  9.5 05/17/2024   ANIONGAP 6 05/17/2024   EGFR 27.0 11/03/2023   Lab Results  Component Value Date   CHOL 231 (H) 09/25/2023   Lab Results  Component Value Date   HDL 87 09/25/2023   Lab Results  Component Value Date   LDLCALC 124 (H) 09/25/2023   Lab Results  Component Value Date   TRIG 117 09/25/2023   Lab Results  Component Value Date   CHOLHDL 2.7 09/25/2023   Lab Results  Component Value Date   HGBA1C 5.5 06/23/2023        Results for orders placed or performed in visit on 05/17/24  CMP (Cancer Center only)   Collection Time: 05/17/24  1:53 PM  Result Value Ref Range   Sodium 139 135 - 145 mmol/L   Potassium 4.1 3.5 - 5.1 mmol/L   Chloride 107 98 - 111 mmol/L   CO2 26 22 - 32 mmol/L   Glucose, Bld 93 70 - 99 mg/dL   BUN 33 (H) 8 - 23 mg/dL   Creatinine 8.38 (H) 9.55  - 1.00 mg/dL   Calcium  9.5 8.9 - 10.3 mg/dL   Total Protein 6.9 6.5 - 8.1 g/dL   Albumin  3.4 (L) 3.5 - 5.0 g/dL   AST 14 (L) 15 - 41 U/L   ALT 8 0 - 44 U/L   Alkaline Phosphatase 52 38 - 126 U/L   Total Bilirubin 0.3 0.0 - 1.2 mg/dL   GFR, Estimated 34 (L) >60 mL/min   Anion gap 6 5 - 15  CBC with Differential (Cancer Center Only)   Collection Time: 05/17/24  1:53 PM  Result Value Ref Range   WBC Count 9.7 4.0 - 10.5 K/uL   RBC 3.29 (L) 3.87 - 5.11 MIL/uL   Hemoglobin 9.7 (L) 12.0 - 15.0 g/dL   HCT 70.3 (L) 63.9 - 53.9 %   MCV 90.0 80.0 - 100.0 fL   MCH 29.5 26.0 - 34.0 pg   MCHC 32.8 30.0 - 36.0 g/dL   RDW 84.0 (H) 88.4 - 84.4 %   Platelet Count 303 150 - 400 K/uL   nRBC 0.0 0.0 - 0.2 %   Neutrophils Relative % 68 %   Neutro Abs 6.7 1.7 - 7.7 K/uL   Lymphocytes Relative 17 %   Lymphs Abs 1.6 0.7 - 4.0 K/uL   Monocytes Relative 7 %   Monocytes Absolute 0.7 0.1 - 1.0 K/uL   Eosinophils Relative 6 %   Eosinophils Absolute 0.6 (H) 0.0 - 0.5 K/uL   Basophils Relative 1 %   Basophils Absolute 0.1 0.0 - 0.1 K/uL   Immature Granulocytes 1 %   Abs Immature Granulocytes 0.07 0.00 - 0.07 K/uL  T4, free   Collection Time: 05/17/24  1:53 PM  Result Value Ref Range   Free T4 0.88 0.61 - 1.12  ng/dL     Assessment & Plan:   Assessment & Plan     There is no height or weight on file to calculate BMI.SABRA  Assessment and Plan      No orders of the defined types were placed in this encounter.   No orders of the defined types were placed in this encounter.    Follow-up: No follow-ups on file.  An After Visit Summary was printed and given to the patient.    I,Quorra Rosene M Zen Felling,acting as a scribe for Harrie CHRISTELLA Cedar, FNP.,have documented all relevant documentation on the behalf of Harrie CHRISTELLA Cedar, FNP,as directed by  Harrie CHRISTELLA Cedar, FNP while in the presence of Harrie CHRISTELLA Cedar, FNP.    Harrie CHRISTELLA Cedar, FNP [facility] [phone]

## 2024-05-18 NOTE — Assessment & Plan Note (Addendum)
 Clinically stable Repeat lab before each treatment Increase fluid at home

## 2024-05-18 NOTE — Assessment & Plan Note (Addendum)
 Continue with reduced dose of EV to 1mg /kg Will try to continue day 8 back as tolerated Seeing palliative care as well for chronic pain management Continue EVP as tolerated Follow up with next cycle.

## 2024-05-18 NOTE — Assessment & Plan Note (Addendum)
 B12 1000 mcg daily

## 2024-05-18 NOTE — Assessment & Plan Note (Addendum)
 Improved    05/17/2024    2:27 PM 09/25/2023    2:39 PM 06/02/2023    4:01 PM  PHQ9 SCORE ONLY  PHQ-9 Total Score 0 11  10      Data saved with a previous flowsheet row definition  - Continue Abilify  to 5 mg before bed continue Zoloft  at 50 mg daily

## 2024-05-18 NOTE — Assessment & Plan Note (Addendum)
 [name] had contacted IR to schedule for removal.

## 2024-05-18 NOTE — Assessment & Plan Note (Addendum)
 Intermittent able to tolerate treatment

## 2024-05-18 NOTE — Assessment & Plan Note (Signed)
 Stable Shortness of breath related to post chemotherapy. - Continue taking Breo Ellipta  100-25 and DuoNeb as needed - Continue Xyzal and Flonase  as needed for allergy symptoms

## 2024-05-18 NOTE — Assessment & Plan Note (Addendum)
 Chronic otitis media with effusion, right ear worse than left Chronic otitis media with effusion in the right ear with fluid sensation and mild pain. Flonase  ineffective. No antibiotic allergies. - Prescribe antibiotic for ear infection. - Continue to take Flonase  and educated on proper technique - Sent Diflucan  to pharmacy due to history of yeast infection following antibiotics. Advise to avoid taking Diflucan  and methadone  together due to potential QT interval prolongation.  Orders:   fluconazole  (DIFLUCAN ) 150 MG tablet; Take 1 tablet (150 mg total) by mouth daily for 1 dose.   amoxicillin -clavulanate (AUGMENTIN ) 500-125 MG tablet; Take 1 tablet by mouth in the morning and at bedtime for 5 days.

## 2024-05-19 ENCOUNTER — Other Ambulatory Visit: Payer: Self-pay

## 2024-05-19 ENCOUNTER — Other Ambulatory Visit: Payer: Self-pay | Admitting: Family Medicine

## 2024-05-19 ENCOUNTER — Other Ambulatory Visit (HOSPITAL_COMMUNITY): Payer: Self-pay

## 2024-05-19 DIAGNOSIS — Z515 Encounter for palliative care: Secondary | ICD-10-CM

## 2024-05-19 DIAGNOSIS — C679 Malignant neoplasm of bladder, unspecified: Secondary | ICD-10-CM

## 2024-05-19 DIAGNOSIS — E440 Moderate protein-calorie malnutrition: Secondary | ICD-10-CM

## 2024-05-19 DIAGNOSIS — G893 Neoplasm related pain (acute) (chronic): Secondary | ICD-10-CM

## 2024-05-19 MED ORDER — OXYCODONE HCL 20 MG PO TABS
20.0000 mg | ORAL_TABLET | ORAL | 0 refills | Status: DC | PRN
  Filled 2024-05-22 – 2024-05-24 (×2): qty 90, 15d supply, fill #0

## 2024-05-19 NOTE — Addendum Note (Signed)
 Addended by: TINA HUDSON on: 05/19/2024 03:45 PM   Modules accepted: Orders

## 2024-05-20 ENCOUNTER — Other Ambulatory Visit (HOSPITAL_COMMUNITY): Payer: Self-pay

## 2024-05-20 ENCOUNTER — Telehealth: Payer: Self-pay

## 2024-05-20 NOTE — Telephone Encounter (Signed)
 Scheduled appointments per WQ. Talked with the patient and she is aware of the made appointments.

## 2024-05-21 DIAGNOSIS — I420 Dilated cardiomyopathy: Secondary | ICD-10-CM | POA: Diagnosis not present

## 2024-05-21 DIAGNOSIS — M47814 Spondylosis without myelopathy or radiculopathy, thoracic region: Secondary | ICD-10-CM | POA: Diagnosis not present

## 2024-05-21 DIAGNOSIS — Z7951 Long term (current) use of inhaled steroids: Secondary | ICD-10-CM | POA: Diagnosis not present

## 2024-05-21 DIAGNOSIS — C679 Malignant neoplasm of bladder, unspecified: Secondary | ICD-10-CM | POA: Diagnosis not present

## 2024-05-21 DIAGNOSIS — I5042 Chronic combined systolic (congestive) and diastolic (congestive) heart failure: Secondary | ICD-10-CM | POA: Diagnosis not present

## 2024-05-21 DIAGNOSIS — M069 Rheumatoid arthritis, unspecified: Secondary | ICD-10-CM | POA: Diagnosis not present

## 2024-05-21 DIAGNOSIS — D630 Anemia in neoplastic disease: Secondary | ICD-10-CM | POA: Diagnosis not present

## 2024-05-21 DIAGNOSIS — M4726 Other spondylosis with radiculopathy, lumbar region: Secondary | ICD-10-CM | POA: Diagnosis not present

## 2024-05-21 DIAGNOSIS — E440 Moderate protein-calorie malnutrition: Secondary | ICD-10-CM | POA: Diagnosis not present

## 2024-05-21 DIAGNOSIS — E782 Mixed hyperlipidemia: Secondary | ICD-10-CM | POA: Diagnosis not present

## 2024-05-21 DIAGNOSIS — F5101 Primary insomnia: Secondary | ICD-10-CM | POA: Diagnosis not present

## 2024-05-21 DIAGNOSIS — D631 Anemia in chronic kidney disease: Secondary | ICD-10-CM | POA: Diagnosis not present

## 2024-05-21 DIAGNOSIS — Z936 Other artificial openings of urinary tract status: Secondary | ICD-10-CM | POA: Diagnosis not present

## 2024-05-21 DIAGNOSIS — M8008XD Age-related osteoporosis with current pathological fracture, vertebra(e), subsequent encounter for fracture with routine healing: Secondary | ICD-10-CM | POA: Diagnosis not present

## 2024-05-21 DIAGNOSIS — J9611 Chronic respiratory failure with hypoxia: Secondary | ICD-10-CM | POA: Diagnosis not present

## 2024-05-21 DIAGNOSIS — D520 Dietary folate deficiency anemia: Secondary | ICD-10-CM | POA: Diagnosis not present

## 2024-05-21 DIAGNOSIS — N1832 Chronic kidney disease, stage 3b: Secondary | ICD-10-CM | POA: Diagnosis not present

## 2024-05-21 DIAGNOSIS — I2510 Atherosclerotic heart disease of native coronary artery without angina pectoris: Secondary | ICD-10-CM | POA: Diagnosis not present

## 2024-05-21 DIAGNOSIS — Z7982 Long term (current) use of aspirin: Secondary | ICD-10-CM | POA: Diagnosis not present

## 2024-05-21 DIAGNOSIS — G8220 Paraplegia, unspecified: Secondary | ICD-10-CM | POA: Diagnosis not present

## 2024-05-22 ENCOUNTER — Other Ambulatory Visit (HOSPITAL_COMMUNITY): Payer: Self-pay

## 2024-05-24 ENCOUNTER — Other Ambulatory Visit (HOSPITAL_COMMUNITY): Payer: Self-pay

## 2024-05-24 ENCOUNTER — Telehealth: Payer: Self-pay

## 2024-05-24 ENCOUNTER — Other Ambulatory Visit: Payer: Self-pay | Admitting: Physician Assistant

## 2024-05-24 NOTE — Telephone Encounter (Signed)
 Called Cecillia back and   VERBAL ORDERS WHERE GIVEN.   Copied from CRM [id number]. Topic: Clinical - Home Health Verbal Orders >> May 24, 2024  1:48 PM Tiffany B wrote: Requesting PT orders for 1x 9, caller would like a follow up call today.

## 2024-05-24 NOTE — Assessment & Plan Note (Addendum)
 Improved today Monitor with each treatment

## 2024-05-24 NOTE — Assessment & Plan Note (Addendum)
 Clinically stable Repeat lab before each treatment Increase fluid at home

## 2024-05-24 NOTE — Progress Notes (Unsigned)
 [facility] OFFICE PROGRESS NOTE  Patient Care Team: Teressa Harrie HERO, FNP as PCP - General (Family Medicine) Inocencio Soyla Lunger, MD as PCP - Electrophysiology (Cardiology) Bernie Lamar PARAS, MD as PCP - Cardiology (Cardiology) Mavis Purchase, MD as Consulting Physician (Neurosurgery) Bernie Lamar PARAS, MD as Consulting Physician (Cardiology) Sheldon Standing, MD as Consulting Physician (General Surgery) Skeet Juliene SAUNDERS, DO as Consulting Physician (Neurology) Delores Lauraine NOVAK, [facility] (Inactive) as Pharmacist (Pharmacist) Saintclair Jasper, MD as Consulting Physician (Gastroenterology) Zehr, Harlene BIRCH, PA-C as Physician Assistant (Gastroenterology) Dennise Hoes, MD as Consulting Physician (Nephrology) Pickenpack-Cousar, Fannie SAILOR, NP as Nurse Practitioner (Hospice and Palliative Medicine)  68 y.o.female with past medical history of COPD, chronic back from from DDD, osteoporosis with fractures presenting with back pain radiating down to the hip in March of 2025 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  Cystoscopy with TURBT show right sided bladder tumor with palpable anterior vaginal mass.  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    Current diagnosis: Stage IVB UC with bone metastases Treatment:  12/18/23 C1D1 EV/P  Clinically she is tolerating well.  No worsening or persistent neuropathy. Assessment & Plan Urothelial carcinoma of bladder (HCC) Continue with reduced dose of EV to 1mg /kg Will try to continue day 8 back as tolerated Seeing palliative care as well for chronic pain management PET after next cycle about end of October Follow up with next cycle. Normocytic anemia Improved today Monitor with each treatment Low vitamin B12 level B12 1000 mcg daily Stage 3b chronic kidney disease (HCC) Clinically stable Repeat lab before each treatment Increase fluid at home Chronic systolic congestive  heart failure, NYHA class 2 (HCC) Continue furosemide  with potassium  Orders Placed This Encounter  Procedures   NM PET Image Restag (PS) Skull Base To Thigh    Standing Status:   Future    Expected Date:   06/24/2024    Expiration Date:   05/25/2025    If indicated for the ordered procedure, I authorize the administration of a radiopharmaceutical per Radiology protocol:   Yes    Preferred imaging location?:   [facility] Darra Pauletta JAYSON Tina, MD  INTERVAL HISTORY: Patient returns for follow-up.  Overall feeling better than few months ago.  Report of slow and steady improvement.  Able to walk some in the house.  No coughing, shortness of breath, chest pain, abdominal pain, diarrhea, rash.  Neuropathy is not significant.  She denies any neuropathy currently.  Oncology History  Urothelial carcinoma of bladder (HCC)  11/18/2023 Initial Diagnosis   Urothelial carcinoma of bladder (HCC)   12/01/2023 Cancer Staging   Staging form: Urinary Bladder, AJCC 8th Edition - Clinical: Stage IVB (cTX, cNX, pM1b) - Signed by Tina Pauletta JAYSON, MD on 12/01/2023 WHO/ISUP grade (low/high): High Grade Histologic grading system: 2 grade system   12/18/2023 -  Chemotherapy   12/18/23 C1D1 01/08/24 C2D1 01/29/24 C3D1. Pembro only. Day 8 reduced EV to 1mg /kg Patient is on Treatment Plan : UROTHELIAL ADVANCED, METASTATIC ENFORTUMAB D1, D8 + PEMBROLIZUMAB  (200) D1 Q21D      02/12/2024 PET scan   PET 1. Response to therapy of osseous metastasis. 2. The pelvis is again poorly evaluated. Soft tissue density within the deep pelvis is less distinct today with decreased hypermetabolism. Potential etiologies again include response to therapy of a synchronous uterine primary versus isolated metastasis (presuming the patient is status post hysterectomy). 3. Placement of a  right ureteric stent with similar mild right-sided hydronephrosis. The left-sided hydroureteronephrosis has resolved. 4.  Aortic Atherosclerosis  (ICD10-I70.0).      PHYSICAL EXAMINATION: ECOG PERFORMANCE STATUS: 3 - Symptomatic, >50% confined to bed  Vitals:   05/25/24 1154  BP: 96/67  Pulse: 92  Resp: 16  Temp: 98.3 F (36.8 C)  SpO2: 99%   Filed Weights    GENERAL: alert, no distress and comfortable SKIN: skin color normal and no jaundice EYES: sclera clear LUNGS: clear to auscultation and no wheeze or rales with normal breathing effort HEART: regular rate & rhythm  ABDOMEN: abdomen soft, non-tender and nondistended. Musculoskeletal: No lower extremity 1+ edema   Relevant data reviewed during this visit included labs.  New scan ordered.

## 2024-05-24 NOTE — Assessment & Plan Note (Addendum)
 B12 1000 mcg daily

## 2024-05-24 NOTE — Assessment & Plan Note (Signed)
 Continue furosemide  with potassium

## 2024-05-24 NOTE — Assessment & Plan Note (Addendum)
 Continue with reduced dose of EV to 1mg /kg Will try to continue day 8 back as tolerated Seeing palliative care as well for chronic pain management Continue EVP as tolerated Follow up with next cycle.

## 2024-05-25 ENCOUNTER — Inpatient Hospital Stay

## 2024-05-25 ENCOUNTER — Telehealth: Payer: Self-pay

## 2024-05-25 ENCOUNTER — Inpatient Hospital Stay (HOSPITAL_BASED_OUTPATIENT_CLINIC_OR_DEPARTMENT_OTHER)

## 2024-05-25 ENCOUNTER — Other Ambulatory Visit (HOSPITAL_COMMUNITY): Payer: Self-pay

## 2024-05-25 VITALS — BP 96/67 | HR 92 | Temp 98.3°F | Resp 16 | Ht <= 58 in

## 2024-05-25 DIAGNOSIS — D649 Anemia, unspecified: Secondary | ICD-10-CM

## 2024-05-25 DIAGNOSIS — C679 Malignant neoplasm of bladder, unspecified: Secondary | ICD-10-CM | POA: Diagnosis not present

## 2024-05-25 DIAGNOSIS — N1832 Chronic kidney disease, stage 3b: Secondary | ICD-10-CM

## 2024-05-25 DIAGNOSIS — D520 Dietary folate deficiency anemia: Secondary | ICD-10-CM | POA: Diagnosis not present

## 2024-05-25 DIAGNOSIS — I5022 Chronic systolic (congestive) heart failure: Secondary | ICD-10-CM

## 2024-05-25 DIAGNOSIS — D638 Anemia in other chronic diseases classified elsewhere: Secondary | ICD-10-CM | POA: Diagnosis not present

## 2024-05-25 DIAGNOSIS — R7989 Other specified abnormal findings of blood chemistry: Secondary | ICD-10-CM | POA: Diagnosis not present

## 2024-05-25 DIAGNOSIS — G629 Polyneuropathy, unspecified: Secondary | ICD-10-CM | POA: Diagnosis not present

## 2024-05-25 DIAGNOSIS — Z5112 Encounter for antineoplastic immunotherapy: Secondary | ICD-10-CM | POA: Diagnosis not present

## 2024-05-25 DIAGNOSIS — C7951 Secondary malignant neoplasm of bone: Secondary | ICD-10-CM | POA: Diagnosis not present

## 2024-05-25 DIAGNOSIS — Z79620 Long term (current) use of immunosuppressive biologic: Secondary | ICD-10-CM | POA: Diagnosis not present

## 2024-05-25 DIAGNOSIS — Z79899 Other long term (current) drug therapy: Secondary | ICD-10-CM | POA: Diagnosis not present

## 2024-05-25 DIAGNOSIS — R600 Localized edema: Secondary | ICD-10-CM | POA: Diagnosis not present

## 2024-05-25 LAB — CBC WITH DIFFERENTIAL (CANCER CENTER ONLY)
Abs Immature Granulocytes: 0.05 K/uL (ref 0.00–0.07)
Basophils Absolute: 0.1 K/uL (ref 0.0–0.1)
Basophils Relative: 1 %
Eosinophils Absolute: 0.7 K/uL — ABNORMAL HIGH (ref 0.0–0.5)
Eosinophils Relative: 7 %
HCT: 28.7 % — ABNORMAL LOW (ref 36.0–46.0)
Hemoglobin: 9.1 g/dL — ABNORMAL LOW (ref 12.0–15.0)
Immature Granulocytes: 1 %
Lymphocytes Relative: 12 %
Lymphs Abs: 1.2 K/uL (ref 0.7–4.0)
MCH: 29.4 pg (ref 26.0–34.0)
MCHC: 31.7 g/dL (ref 30.0–36.0)
MCV: 92.6 fL (ref 80.0–100.0)
Monocytes Absolute: 0.7 K/uL (ref 0.1–1.0)
Monocytes Relative: 7 %
Neutro Abs: 7.4 K/uL (ref 1.7–7.7)
Neutrophils Relative %: 72 %
Platelet Count: 233 K/uL (ref 150–400)
RBC: 3.1 MIL/uL — ABNORMAL LOW (ref 3.87–5.11)
RDW: 15.8 % — ABNORMAL HIGH (ref 11.5–15.5)
WBC Count: 10.1 K/uL (ref 4.0–10.5)
nRBC: 0 % (ref 0.0–0.2)

## 2024-05-25 LAB — CMP (CANCER CENTER ONLY)
ALT: 7 U/L (ref 0–44)
AST: 14 U/L — ABNORMAL LOW (ref 15–41)
Albumin: 3.4 g/dL — ABNORMAL LOW (ref 3.5–5.0)
Alkaline Phosphatase: 52 U/L (ref 38–126)
Anion gap: 6 (ref 5–15)
BUN: 35 mg/dL — ABNORMAL HIGH (ref 8–23)
CO2: 24 mmol/L (ref 22–32)
Calcium: 9.5 mg/dL (ref 8.9–10.3)
Chloride: 108 mmol/L (ref 98–111)
Creatinine: 1.8 mg/dL — ABNORMAL HIGH (ref 0.44–1.00)
GFR, Estimated: 30 mL/min — ABNORMAL LOW (ref >60)
Glucose, Bld: 122 mg/dL — ABNORMAL HIGH (ref 70–99)
Potassium: 4 mmol/L (ref 3.5–5.1)
Sodium: 138 mmol/L (ref 135–145)
Total Bilirubin: 0.3 mg/dL (ref 0.0–1.2)
Total Protein: 6.6 g/dL (ref 6.5–8.1)

## 2024-05-25 MED ORDER — PROCHLORPERAZINE MALEATE 10 MG PO TABS
10.0000 mg | ORAL_TABLET | Freq: Once | ORAL | Status: AC
  Administered 2024-05-25: 10 mg via ORAL
  Filled 2024-05-25: qty 1

## 2024-05-25 MED ORDER — SODIUM CHLORIDE 0.9 % IV SOLN
1.0000 mg/kg | Freq: Once | INTRAVENOUS | Status: AC
  Administered 2024-05-25: 60 mg via INTRAVENOUS
  Filled 2024-05-25: qty 6

## 2024-05-25 MED ORDER — SODIUM CHLORIDE 0.9 % IV SOLN: INTRAVENOUS | Status: DC

## 2024-05-25 NOTE — Patient Instructions (Signed)
 [facility] - A DEPT OF [facility]  Discharge Instructions: Thank you for choosing [facility] to provide your oncology and hematology care.   If you have a lab appointment with the Cancer Center, please go directly to the Cancer Center and check in at the registration area.   Wear comfortable clothing and clothing appropriate for easy access to any Portacath or PICC line.   We strive to give you quality time with your provider. You may need to reschedule your appointment if you arrive late (15 or more minutes).  Arriving late affects you and other patients whose appointments are after yours.  Also, if you miss three or more appointments without notifying the office, you may be dismissed from the clinic at the provider's discretion.      For prescription refill requests, have your pharmacy contact our office and allow 72 hours for refills to be completed.    Today you received the following chemotherapy and/or immunotherapy agents: Enfortumab vedotin -ejfv (Padcev )    To help prevent nausea and vomiting after your treatment, we encourage you to take your nausea medication as directed.  BELOW ARE SYMPTOMS THAT SHOULD BE REPORTED IMMEDIATELY: *FEVER GREATER THAN 100.4 F (38 C) OR HIGHER *CHILLS OR SWEATING *NAUSEA AND VOMITING THAT IS NOT CONTROLLED WITH YOUR NAUSEA MEDICATION *UNUSUAL SHORTNESS OF BREATH *UNUSUAL BRUISING OR BLEEDING *URINARY PROBLEMS (pain or burning when urinating, or frequent urination) *BOWEL PROBLEMS (unusual diarrhea, constipation, pain near the anus) TENDERNESS IN MOUTH AND THROAT WITH OR WITHOUT PRESENCE OF ULCERS (sore throat, sores in mouth, or a toothache) UNUSUAL RASH, SWELLING OR PAIN  UNUSUAL VAGINAL DISCHARGE OR ITCHING   Items with * indicate a potential emergency and should be followed up as soon as possible or go to the Emergency Department if any problems should occur.  Please show the CHEMOTHERAPY ALERT  CARD or IMMUNOTHERAPY ALERT CARD at check-in to the Emergency Department and triage nurse.  Should you have questions after your visit or need to cancel or reschedule your appointment, please contact [facility] - A DEPT OF [facility]  Dept: [phone]  and follow the prompts.  Office hours are 8:00 a.m. to 4:30 p.m. Monday - Friday. Please note that voicemails left after 4:00 p.m. may not be returned until the following business day.  We are closed weekends and major holidays. You have access to a nurse at all times for urgent questions. Please call the main number to the clinic Dept: [phone] and follow the prompts.   For any non-urgent questions, you may also contact your provider using MyChart. We now offer e-Visits for anyone 39 and older to request care online for non-urgent symptoms. For details visit [email or web address].   Also download the MyChart app! Go to the app store, search MyChart, open the app, select [place], and log in with your MyChart username and password.

## 2024-05-25 NOTE — Telephone Encounter (Signed)
 Left call back message with phone number. Explained had instructions for her mother from Dr Tina. Arland Legions BSN RN

## 2024-05-26 ENCOUNTER — Telehealth: Payer: Self-pay | Admitting: Family Medicine

## 2024-05-26 NOTE — Telephone Encounter (Signed)
 [facility] [phone]

## 2024-05-27 ENCOUNTER — Telehealth: Payer: Self-pay | Admitting: *Deleted

## 2024-05-27 ENCOUNTER — Telehealth: Payer: Self-pay | Admitting: Family Medicine

## 2024-05-27 ENCOUNTER — Other Ambulatory Visit: Payer: Self-pay

## 2024-05-27 NOTE — Telephone Encounter (Signed)
 Error

## 2024-05-27 NOTE — Telephone Encounter (Signed)
-----   Message from [name] sent at 05/25/2024  5:32 PM EDT ----- Arland or Zorita Would you request [employer] on her sample from 3/28? CASE: [id number] For stage IV bladder cancer? We will want to check HER2 on top of the typical mutations they included. Thanks.

## 2024-05-27 NOTE — Telephone Encounter (Signed)
 Orders placed for [employer]

## 2024-05-30 DIAGNOSIS — I5042 Chronic combined systolic (congestive) and diastolic (congestive) heart failure: Secondary | ICD-10-CM | POA: Diagnosis not present

## 2024-05-30 DIAGNOSIS — J9611 Chronic respiratory failure with hypoxia: Secondary | ICD-10-CM | POA: Diagnosis not present

## 2024-05-30 DIAGNOSIS — M4726 Other spondylosis with radiculopathy, lumbar region: Secondary | ICD-10-CM | POA: Diagnosis not present

## 2024-05-30 DIAGNOSIS — G8220 Paraplegia, unspecified: Secondary | ICD-10-CM | POA: Diagnosis not present

## 2024-05-30 DIAGNOSIS — N1832 Chronic kidney disease, stage 3b: Secondary | ICD-10-CM | POA: Diagnosis not present

## 2024-05-30 DIAGNOSIS — M069 Rheumatoid arthritis, unspecified: Secondary | ICD-10-CM | POA: Diagnosis not present

## 2024-05-30 DIAGNOSIS — E440 Moderate protein-calorie malnutrition: Secondary | ICD-10-CM | POA: Diagnosis not present

## 2024-05-30 DIAGNOSIS — M47814 Spondylosis without myelopathy or radiculopathy, thoracic region: Secondary | ICD-10-CM | POA: Diagnosis not present

## 2024-05-30 DIAGNOSIS — C679 Malignant neoplasm of bladder, unspecified: Secondary | ICD-10-CM | POA: Diagnosis not present

## 2024-05-30 DIAGNOSIS — I420 Dilated cardiomyopathy: Secondary | ICD-10-CM | POA: Diagnosis not present

## 2024-05-30 DIAGNOSIS — F331 Major depressive disorder, recurrent, moderate: Secondary | ICD-10-CM

## 2024-05-30 DIAGNOSIS — J4489 Other specified chronic obstructive pulmonary disease: Secondary | ICD-10-CM | POA: Diagnosis not present

## 2024-06-02 ENCOUNTER — Ambulatory Visit (INDEPENDENT_AMBULATORY_CARE_PROVIDER_SITE_OTHER)

## 2024-06-02 ENCOUNTER — Ambulatory Visit (HOSPITAL_COMMUNITY)
Admission: RE | Admit: 2024-06-02 | Discharge: 2024-06-02 | Disposition: A | Discharge status: Home / Self Care | Source: Ambulatory Visit

## 2024-06-02 DIAGNOSIS — I420 Dilated cardiomyopathy: Secondary | ICD-10-CM

## 2024-06-02 DIAGNOSIS — Z936 Other artificial openings of urinary tract status: Secondary | ICD-10-CM

## 2024-06-02 DIAGNOSIS — Z8550 Personal history of malignant neoplasm of unspecified urinary tract organ: Secondary | ICD-10-CM | POA: Diagnosis not present

## 2024-06-02 DIAGNOSIS — C679 Malignant neoplasm of bladder, unspecified: Secondary | ICD-10-CM

## 2024-06-02 DIAGNOSIS — Z436 Encounter for attention to other artificial openings of urinary tract: Secondary | ICD-10-CM | POA: Insufficient documentation

## 2024-06-02 HISTORY — PX: IR NEPHRO TUBE REMOV/FL: IMG2342

## 2024-06-02 LAB — URINALYSIS, ROUTINE W REFLEX MICROSCOPIC
Bilirubin Urine: NEGATIVE
Glucose, UA: NEGATIVE mg/dL
Ketones, ur: NEGATIVE mg/dL
Nitrite: NEGATIVE
Protein, ur: >=300 mg/dL — AB
Specific Gravity, Urine: 1.005 (ref 1.005–1.030)
WBC, UA: >50 WBC/hpf (ref 0–5)
pH: 7 (ref 5.0–8.0)

## 2024-06-02 MED ORDER — IOHEXOL 300 MG/ML  SOLN
50.0000 mL | Freq: Once | INTRAMUSCULAR | Status: AC | PRN
  Administered 2024-06-02: 10 mL

## 2024-06-03 DIAGNOSIS — D520 Dietary folate deficiency anemia: Secondary | ICD-10-CM | POA: Diagnosis not present

## 2024-06-03 DIAGNOSIS — E782 Mixed hyperlipidemia: Secondary | ICD-10-CM | POA: Diagnosis not present

## 2024-06-03 DIAGNOSIS — C679 Malignant neoplasm of bladder, unspecified: Secondary | ICD-10-CM | POA: Diagnosis not present

## 2024-06-03 DIAGNOSIS — I5042 Chronic combined systolic (congestive) and diastolic (congestive) heart failure: Secondary | ICD-10-CM | POA: Diagnosis not present

## 2024-06-03 DIAGNOSIS — M069 Rheumatoid arthritis, unspecified: Secondary | ICD-10-CM | POA: Diagnosis not present

## 2024-06-03 DIAGNOSIS — I420 Dilated cardiomyopathy: Secondary | ICD-10-CM | POA: Diagnosis not present

## 2024-06-03 DIAGNOSIS — I700 Atherosclerosis of aorta: Secondary | ICD-10-CM | POA: Diagnosis not present

## 2024-06-03 DIAGNOSIS — J4489 Other specified chronic obstructive pulmonary disease: Secondary | ICD-10-CM | POA: Diagnosis not present

## 2024-06-03 DIAGNOSIS — D631 Anemia in chronic kidney disease: Secondary | ICD-10-CM | POA: Diagnosis not present

## 2024-06-03 DIAGNOSIS — M8008XD Age-related osteoporosis with current pathological fracture, vertebra(e), subsequent encounter for fracture with routine healing: Secondary | ICD-10-CM | POA: Diagnosis not present

## 2024-06-03 DIAGNOSIS — N1832 Chronic kidney disease, stage 3b: Secondary | ICD-10-CM | POA: Diagnosis not present

## 2024-06-03 DIAGNOSIS — G8220 Paraplegia, unspecified: Secondary | ICD-10-CM | POA: Diagnosis not present

## 2024-06-03 DIAGNOSIS — Z960 Presence of urogenital implants: Secondary | ICD-10-CM | POA: Diagnosis not present

## 2024-06-03 DIAGNOSIS — E440 Moderate protein-calorie malnutrition: Secondary | ICD-10-CM | POA: Diagnosis not present

## 2024-06-03 DIAGNOSIS — F5101 Primary insomnia: Secondary | ICD-10-CM | POA: Diagnosis not present

## 2024-06-03 DIAGNOSIS — Z7982 Long term (current) use of aspirin: Secondary | ICD-10-CM | POA: Diagnosis not present

## 2024-06-03 DIAGNOSIS — D630 Anemia in neoplastic disease: Secondary | ICD-10-CM | POA: Diagnosis not present

## 2024-06-03 DIAGNOSIS — I2510 Atherosclerotic heart disease of native coronary artery without angina pectoris: Secondary | ICD-10-CM | POA: Diagnosis not present

## 2024-06-03 DIAGNOSIS — M47814 Spondylosis without myelopathy or radiculopathy, thoracic region: Secondary | ICD-10-CM | POA: Diagnosis not present

## 2024-06-03 DIAGNOSIS — J9611 Chronic respiratory failure with hypoxia: Secondary | ICD-10-CM | POA: Diagnosis not present

## 2024-06-03 DIAGNOSIS — M4726 Other spondylosis with radiculopathy, lumbar region: Secondary | ICD-10-CM | POA: Diagnosis not present

## 2024-06-03 LAB — CUP PACEART REMOTE DEVICE CHECK
Battery Remaining Longevity: 74 mo
Battery Voltage: 2.99 V
Brady Statistic AP VP Percent: 0 %
Brady Statistic AP VS Percent: 0.46 %
Brady Statistic AS VP Percent: 0.03 %
Brady Statistic AS VS Percent: 99.5 %
Brady Statistic RA Percent Paced: 0.46 %
Brady Statistic RV Percent Paced: 0.03 %
Date Time Interrogation Session: 20251008022605
HighPow Impedance: 66 Ohm
Implantable Lead Connection Status: 753985
Implantable Lead Connection Status: 753985
Implantable Lead Implant Date: 20120502
Implantable Lead Implant Date: 20120502
Implantable Lead Location: 753859
Implantable Lead Location: 753860
Implantable Lead Model: 4076
Implantable Lead Model: 7122
Implantable Pulse Generator Implant Date: 20200925
Lead Channel Impedance Value: 361 Ohm
Lead Channel Impedance Value: 665 Ohm
Lead Channel Impedance Value: 760 Ohm
Lead Channel Pacing Threshold Amplitude: 0.5 V
Lead Channel Pacing Threshold Amplitude: 1.125 V
Lead Channel Pacing Threshold Pulse Width: 0.4 ms
Lead Channel Pacing Threshold Pulse Width: 0.4 ms
Lead Channel Sensing Intrinsic Amplitude: 11.875 mV
Lead Channel Sensing Intrinsic Amplitude: 11.875 mV
Lead Channel Sensing Intrinsic Amplitude: 2.625 mV
Lead Channel Sensing Intrinsic Amplitude: 2.625 mV
Lead Channel Setting Pacing Amplitude: 1.5 V
Lead Channel Setting Pacing Amplitude: 2.25 V
Lead Channel Setting Pacing Pulse Width: 0.4 ms
Lead Channel Setting Sensing Sensitivity: 0.3 mV
Zone Setting Status: 755011
Zone Setting Status: 755011

## 2024-06-04 ENCOUNTER — Ambulatory Visit: Payer: Self-pay | Admitting: Cardiology

## 2024-06-04 NOTE — Progress Notes (Signed)
 Remote ICD Transmission

## 2024-06-06 NOTE — Assessment & Plan Note (Addendum)
 Continue with reduced dose of EV to 1mg /kg Will try to continue day 8 as tolerated Seeing palliative care as well for chronic pain management PET after next cycle scheduled at the end of October.  She has appointment with me on 11/4 to go over results. Follow up with next cycle.

## 2024-06-06 NOTE — Progress Notes (Unsigned)
  [facility]cer [facility] OFFICE PROGRESS NOTE  Patient Care Team: Teressa Harrie HERO, FNP as PCP - General (Family Medicine) Inocencio Soyla Lunger, MD as PCP - Electrophysiology (Cardiology) Bernie Lamar PARAS, MD as PCP - Cardiology (Cardiology) Mavis Purchase, MD as Consulting Physician (Neurosurgery) Bernie Lamar PARAS, MD as Consulting Physician (Cardiology) Sheldon Standing, MD as Consulting Physician (General Surgery) Skeet Juliene SAUNDERS, DO as Consulting Physician (Neurology) Delores Lauraine NOVAK, [facility] (Inactive) as Pharmacist (Pharmacist) Saintclair Jasper, MD as Consulting Physician (Gastroenterology) Zehr, Harlene BIRCH, PA-C as Physician Assistant (Gastroenterology) Dennise Hoes, MD as Consulting Physician (Nephrology) Pickenpack-Cousar, Fannie SAILOR, NP as Nurse Practitioner (Hospice and Palliative Medicine)  68 y.o.female with past medical history of COPD, chronic back from from DDD, osteoporosis with fractures presenting with back pain radiating down to the hip in March of 2025 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  Cystoscopy with TURBT show right sided bladder tumor with palpable anterior vaginal mass.  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    Current diagnosis: Stage IVB UC with bone metastases Treatment:  12/18/23 C1D1 EV/P  On day 8, if no concerning toxicities or persistent neuropathy, can proceed with treatment. Assessment & Plan Urothelial carcinoma of bladder (HCC) Continue with reduced dose of EV to 1mg /kg Will try to continue day 8 as tolerated Seeing palliative care as well for chronic pain management PET after next cycle scheduled at the end of October Follow up with next cycle.  No orders of the defined types were placed in this encounter.    [name], MD  INTERVAL HISTORY: Patient returns for follow-up.  Oncology History  Urothelial carcinoma of bladder (HCC)  11/18/2023 Initial Diagnosis    Urothelial carcinoma of bladder (HCC)   12/01/2023 Cancer Staging   Staging form: Urinary Bladder, AJCC 8th Edition - Clinical: Stage IVB (cTX, cNX, pM1b) - Signed by Chihuahua Pauletta JAYSON, MD on 12/01/2023 WHO/ISUP grade (low/high): High Grade Histologic grading system: 2 grade system   12/18/2023 -  Chemotherapy   12/18/23 C1D1 01/08/24 C2D1 01/29/24 C3D1. Pembro only. Day 8 reduced EV to 1mg /kg Patient is on Treatment Plan : UROTHELIAL ADVANCED, METASTATIC ENFORTUMAB D1, D8 + PEMBROLIZUMAB  (200) D1 Q21D      02/12/2024 PET scan   PET 1. Response to therapy of osseous metastasis. 2. The pelvis is again poorly evaluated. Soft tissue density within the deep pelvis is less distinct today with decreased hypermetabolism. Potential etiologies again include response to therapy of a synchronous uterine primary versus isolated metastasis (presuming the patient is status post hysterectomy). 3. Placement of a right ureteric stent with similar mild right-sided hydronephrosis. The left-sided hydroureteronephrosis has resolved. 4.  Aortic Atherosclerosis (ICD10-I70.0).      PHYSICAL EXAMINATION: ECOG PERFORMANCE STATUS: {CHL ONC ECOG [phone]}  There were no vitals filed for this visit. There were no vitals filed for this visit.  GENERAL: alert, no distress and comfortable SKIN: skin color normal and no jaundice or bruising or petechiae on exposed skin EYES: normal, sclera clear OROPHARYNX: no exudate  NECK: No palpable mass LYMPH:  no palpable cervical, axillary lymphadenopathy  LUNGS: clear to auscultation and no wheeze or rales with normal breathing effort HEART: regular rate & rhythm  ABDOMEN: abdomen soft, non-tender and nondistended. Musculoskeletal: no edema NEURO: no focal motor/sensory deficits  Relevant data reviewed during this visit included labs.  New labs ordered.

## 2024-06-07 ENCOUNTER — Other Ambulatory Visit (HOSPITAL_COMMUNITY): Payer: Self-pay

## 2024-06-07 ENCOUNTER — Inpatient Hospital Stay

## 2024-06-07 ENCOUNTER — Encounter: Payer: Self-pay | Admitting: Nurse Practitioner

## 2024-06-07 ENCOUNTER — Inpatient Hospital Stay (HOSPITAL_BASED_OUTPATIENT_CLINIC_OR_DEPARTMENT_OTHER)

## 2024-06-07 ENCOUNTER — Inpatient Hospital Stay (HOSPITAL_BASED_OUTPATIENT_CLINIC_OR_DEPARTMENT_OTHER): Admitting: Nurse Practitioner

## 2024-06-07 VITALS — BP 93/63 | HR 70 | Temp 97.9°F | Resp 18 | Wt 126.0 lb

## 2024-06-07 DIAGNOSIS — G4700 Insomnia, unspecified: Secondary | ICD-10-CM | POA: Insufficient documentation

## 2024-06-07 DIAGNOSIS — N1831 Chronic kidney disease, stage 3a: Secondary | ICD-10-CM

## 2024-06-07 DIAGNOSIS — G4709 Other insomnia: Secondary | ICD-10-CM | POA: Diagnosis not present

## 2024-06-07 DIAGNOSIS — C679 Malignant neoplasm of bladder, unspecified: Secondary | ICD-10-CM

## 2024-06-07 DIAGNOSIS — Z5112 Encounter for antineoplastic immunotherapy: Secondary | ICD-10-CM | POA: Insufficient documentation

## 2024-06-07 DIAGNOSIS — G893 Neoplasm related pain (acute) (chronic): Secondary | ICD-10-CM | POA: Insufficient documentation

## 2024-06-07 DIAGNOSIS — Z79899 Other long term (current) drug therapy: Secondary | ICD-10-CM | POA: Diagnosis not present

## 2024-06-07 DIAGNOSIS — C7951 Secondary malignant neoplasm of bone: Secondary | ICD-10-CM | POA: Insufficient documentation

## 2024-06-07 DIAGNOSIS — M810 Age-related osteoporosis without current pathological fracture: Secondary | ICD-10-CM | POA: Insufficient documentation

## 2024-06-07 DIAGNOSIS — M436 Torticollis: Secondary | ICD-10-CM | POA: Diagnosis not present

## 2024-06-07 DIAGNOSIS — D649 Anemia, unspecified: Secondary | ICD-10-CM

## 2024-06-07 DIAGNOSIS — Z79620 Long term (current) use of immunosuppressive biologic: Secondary | ICD-10-CM | POA: Diagnosis not present

## 2024-06-07 DIAGNOSIS — Z515 Encounter for palliative care: Secondary | ICD-10-CM

## 2024-06-07 DIAGNOSIS — Z960 Presence of urogenital implants: Secondary | ICD-10-CM | POA: Diagnosis not present

## 2024-06-07 DIAGNOSIS — Z79891 Long term (current) use of opiate analgesic: Secondary | ICD-10-CM | POA: Insufficient documentation

## 2024-06-07 DIAGNOSIS — D638 Anemia in other chronic diseases classified elsewhere: Secondary | ICD-10-CM | POA: Diagnosis not present

## 2024-06-07 LAB — CBC WITH DIFFERENTIAL (CANCER CENTER ONLY)
Abs Immature Granulocytes: 0.05 K/uL (ref 0.00–0.07)
Basophils Absolute: 0.1 K/uL (ref 0.0–0.1)
Basophils Relative: 1 %
Eosinophils Absolute: 0.5 K/uL (ref 0.0–0.5)
Eosinophils Relative: 10 %
HCT: 26.4 % — ABNORMAL LOW (ref 36.0–46.0)
Hemoglobin: 9 g/dL — ABNORMAL LOW (ref 12.0–15.0)
Immature Granulocytes: 1 %
Lymphocytes Relative: 23 %
Lymphs Abs: 1.2 K/uL (ref 0.7–4.0)
MCH: 30.1 pg (ref 26.0–34.0)
MCHC: 34.1 g/dL (ref 30.0–36.0)
MCV: 88.3 fL (ref 80.0–100.0)
Monocytes Absolute: 0.6 K/uL (ref 0.1–1.0)
Monocytes Relative: 10 %
Neutro Abs: 2.9 K/uL (ref 1.7–7.7)
Neutrophils Relative %: 55 %
Platelet Count: 291 K/uL (ref 150–400)
RBC: 2.99 MIL/uL — ABNORMAL LOW (ref 3.87–5.11)
RDW: 16.1 % — ABNORMAL HIGH (ref 11.5–15.5)
WBC Count: 5.3 K/uL (ref 4.0–10.5)
nRBC: 0 % (ref 0.0–0.2)

## 2024-06-07 LAB — CMP (CANCER CENTER ONLY)
ALT: 8 U/L (ref 0–44)
AST: 15 U/L (ref 15–41)
Albumin: 3.5 g/dL (ref 3.5–5.0)
Alkaline Phosphatase: 60 U/L (ref 38–126)
Anion gap: 7 (ref 5–15)
BUN: 32 mg/dL — ABNORMAL HIGH (ref 8–23)
CO2: 25 mmol/L (ref 22–32)
Calcium: 9.2 mg/dL (ref 8.9–10.3)
Chloride: 98 mmol/L (ref 98–111)
Creatinine: 1.63 mg/dL — ABNORMAL HIGH (ref 0.44–1.00)
GFR, Estimated: 34 mL/min — ABNORMAL LOW (ref >60)
Glucose, Bld: 102 mg/dL — ABNORMAL HIGH (ref 70–99)
Potassium: 3.4 mmol/L — ABNORMAL LOW (ref 3.5–5.1)
Sodium: 130 mmol/L — ABNORMAL LOW (ref 135–145)
Total Bilirubin: 0.3 mg/dL (ref 0.0–1.2)
Total Protein: 6.7 g/dL (ref 6.5–8.1)

## 2024-06-07 LAB — T4, FREE: Free T4: 0.79 ng/dL (ref 0.61–1.12)

## 2024-06-07 MED ORDER — SODIUM CHLORIDE 0.9 % IV SOLN
200.0000 mg | Freq: Once | INTRAVENOUS | Status: AC
  Administered 2024-06-07: 200 mg via INTRAVENOUS
  Filled 2024-06-07: qty 200

## 2024-06-07 MED ORDER — ENSURE NUTRITION SHAKE PO LIQD: ORAL | Status: DC

## 2024-06-07 MED ORDER — PROCHLORPERAZINE MALEATE 10 MG PO TABS
10.0000 mg | ORAL_TABLET | Freq: Once | ORAL | Status: AC
  Administered 2024-06-07: 10 mg via ORAL
  Filled 2024-06-07: qty 1

## 2024-06-07 MED ORDER — TIZANIDINE HCL 4 MG PO TABS
4.0000 mg | ORAL_TABLET | Freq: Every day | ORAL | 3 refills | Status: DC
  Filled 2024-06-07 – 2024-06-14 (×2): qty 30, 30d supply, fill #0

## 2024-06-07 MED ORDER — SODIUM CHLORIDE 0.9 % IV SOLN
1.0000 mg/kg | Freq: Once | INTRAVENOUS | Status: AC
  Administered 2024-06-07: 60 mg via INTRAVENOUS
  Filled 2024-06-07: qty 6

## 2024-06-07 MED ORDER — SODIUM CHLORIDE 0.9 % IV SOLN: INTRAVENOUS | Status: DC

## 2024-06-07 MED ORDER — OXYCODONE HCL 20 MG PO TABS
20.0000 mg | ORAL_TABLET | ORAL | 0 refills | Status: DC | PRN
  Filled 2024-06-07: qty 90, 15d supply, fill #0

## 2024-06-07 NOTE — Addendum Note (Signed)
 Addended by: DASIE SIMPLE B on: 06/07/2024 04:48 PM   Modules accepted: Orders

## 2024-06-07 NOTE — Progress Notes (Signed)
 Remote ICD Transmission

## 2024-06-07 NOTE — Assessment & Plan Note (Addendum)
 Discussed with patient and daughter, need urology follow-up for future exchange.  Daughter will call urology.

## 2024-06-07 NOTE — Progress Notes (Signed)
 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  She is receiving palliative padcev  and keytruda .  Patient followed by Dr tina.  Met with patient and daughter.  Reports that appetite has improved.  Drinking ensure shake with medication each day and tolerating well.  Having a hard time affording oral nutrition supplements drinks.  She tried the ensure complete/plus and does not like it as well as original.  Patient eating macaroni and cheese, pizza, meatballs, lima beans, peanut butter.  Reports some diarrhea but manageable    Medications: reviewed  Labs: K 3.4, glucose 102, BUN 32, creatinine 1.63  Anthropometrics:   Weight: 126 lb  124 lb on 9/22 135 lb on 8/27 147 lb on 7/2 (edema)  14% weight loss in the last 2 1/2 months, significant (some fluid loss)   NUTRITION DIAGNOSIS: Unintentional weight loss    INTERVENTION:  Encouraged foods rich in protein Message sent to MD and team regarding oral nutrition supplement coverage from [facility].      MONITORING, EVALUATION, GOAL: weight trends, intake   NEXT VISIT: Tuesday, Nov 4 during infusion  Jandy Brackens B. Dasie SOLON, CSO, LDN Registered Dietitian [phone]

## 2024-06-07 NOTE — Assessment & Plan Note (Addendum)
 Stable  Monitor with each treatment

## 2024-06-07 NOTE — Progress Notes (Signed)
 Palliative Medicine [facility]  [phone]) [phone] [phone]) [phone]   Name: ALAZE GARVERICK Date: 06/07/2024 MRN: [id number]  DOB: 10-11-1955  Patient Care Team: Teressa Harrie HERO, FNP as PCP - General (Family Medicine) Inocencio Soyla Lunger, MD as PCP - Electrophysiology (Cardiology) Bernie Lamar PARAS, MD as PCP - Cardiology (Cardiology) Mavis Purchase, MD as Consulting Physician (Neurosurgery) Bernie Lamar PARAS, MD as Consulting Physician (Cardiology) Sheldon Standing, MD as Consulting Physician (General Surgery) Skeet Juliene SAUNDERS, DO as Consulting Physician (Neurology) Delores Lauraine NOVAK, [facility] (Inactive) as Pharmacist (Pharmacist) Saintclair Jasper, MD as Consulting Physician (Gastroenterology) Zehr, Harlene BIRCH, PA-C as Physician Assistant (Gastroenterology) Dennise Hoes, MD as Consulting Physician (Nephrology) Pickenpack-Cousar, Fannie SAILOR, NP as Nurse Practitioner (Hospice and Palliative Medicine)   INTERVAL HISTORY: LEDIA HANFORD is a 68 y.o. female with oncologic medical history including metastatic bladder cancer with T5 bone metastasis, COPD, chronic back pain from DDD, osteoporosis with fractures, chronic pain management at [facility] medical.  Palliative is seeing patient for symptom management and goals of care.   SOCIAL HISTORY:     reports that she quit smoking about 4 years ago. Her smoking use included cigarettes. She started smoking about 34 years ago. She has a 15 pack-year smoking history. She has never used smokeless tobacco. She reports current alcohol use of about 2.0 standard drinks of alcohol per week. She reports that she does not use drugs.  ADVANCE DIRECTIVES:  None on file   CODE STATUS: Full code  PAST MEDICAL HISTORY: Past Medical History:  Diagnosis Date   Abnormality of gait due to impairment of balance 11/22/2020   Admission for long-term opiate analgesic use 10/24/2019   AKI (acute kidney injury) 11/14/2023   Anemia of chronic disease  06/07/2023   Arthralgia of both hands 04/07/2023   Arthritis of right hip 05/29/2016   Formatting of this note might be different from the original. Added automatically from request for surgery [id number]   Arthritis of right knee 11/27/2016   At risk for side effect of medication 12/18/2023   Bilateral lower extremity edema 06/07/2023   BMI 26.0-26.9,adult 03/29/2020   Bone lesion 11/18/2023   Cardiomyopathy (HCC)    Overview:  Ejection fraction 45% in 2015 Ejection fraction 30 to 35% in November 2018   Chronic active hepatitis (HCC) 01/04/2023   Chronic back pain    Chronic hypoxemic respiratory failure (HCC) 10/24/2019   Chronic narcotic use 03/23/2015   Chronic pain of both knees 12/21/2018   Added automatically from request for surgery [id number]  Formatting of this note might be different from the original. 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   Dyslipidemia 05/31/2015   Elevated serum glucose 06/23/2023  Fatigue 06/23/2023   Folate deficiency 01/08/2024   GERD (gastroesophageal reflux disease)    History of total hip  replacement 09/25/2016   Hypoaldosteronism 07/13/2020   Hypotension 05/17/2020   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   Ileus following gastrointestinal surgery (HCC) 03/26/2015   Iron deficiency 11/21/2023   Low vitamin B12 level 11/21/2023   Major depressive disorder, single episode, moderate (HCC) 10/24/2019   Malnutrition of moderate degree 10/20/2020   Mesenteric ischemia 10/16/2020   Migraine without aura and without status migrainosus, not intractable 01/04/2023   Mixed hyperlipidemia 05/31/2015   Mixed incontinence 10/20/2020   Moderate protein-calorie malnutrition 11/03/2023   Nausea with vomiting    Nausea without vomiting 01/29/2024   Nephrostomy present (HCC) 12/01/2023   Neuropathy 01/29/2024   Normocytic anemia 06/23/2023   Osteoporosis 10/24/2019   Other spondylosis with radiculopathy, lumbar region 10/24/2019   Paraplegia (HCC) 11/14/2023   Presence of left artificial hip joint 10/24/2019   Primary insomnia 01/04/2023   PTSD (post-traumatic stress disorder)    Renal lesion 11/05/2023   Rheumatoid arthritis involving multiple sites (HCC) 04/07/2023   Senile osteoporosis 10/24/2019   Serosanguineous chronic otitis media of right ear 08/02/2021   Therapeutic opioid induced constipation 01/04/2023   Thyroid  disease    Urge incontinence of urine 11/03/2023   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   Vertebral fracture, pathological 12/01/2023   Vitamin D  deficiency 09/25/2023    ALLERGIES:  is allergic to lyrica  [pregabalin ].  MEDICATIONS:  Current Outpatient Medications  Medication Sig Dispense Refill   ARIPiprazole  (ABILIFY ) 5 MG tablet TAKE 1 TABLET BY MOUTH DAILY 30 tablet 10   aspirin  81 MG tablet Take 81 mg by mouth daily.     BREO ELLIPTA  100-25 MCG/ACT AEPB INHALE ONE (1) PUFF BY MOUTH DAILY 60 each 11   Carboxymethylcell-Glycerin  PF 0.5-0.9 % SOLN Place 2 drops into both eyes 4 (four) times daily. 1 each 11    carvedilol  (COREG ) 3.125 MG tablet TAKE ONE (1) TABLET BY MOUTH TWICE DAILY WITH MEALS (Patient taking differently: Take 3.125 mg by mouth 2 (two) times daily with a meal.) 60 tablet 10   denosumab  (PROLIA ) 60 MG/ML SOSY injection Inject 60 mg into the skin every 6 (six) months. 180 mL 2   dextromethorphan  15 MG/5ML syrup Take 10 mLs (30 mg total) by mouth 4 (four) times daily as needed for cough. 120 mL 0   diclofenac  Sodium (VOLTAREN ) 1 % GEL Apply 2 g topically 4 (four) times daily. 50 g 4   diphenoxylate -atropine  (LOMOTIL ) 2.5-0.025 MG tablet Take 1 tablet by mouth 4 (four) times daily as needed for diarrhea or loose stools. 60 tablet 0   dronabinol  (MARINOL ) 5 MG capsule Take 1 capsule (5 mg total) by mouth 2 (two) times daily before lunch and supper. 60 capsule 0   ENTRESTO  24-26 MG TAKE ONE (1) TABLET BY MOUTH TWICE DAILY 60 tablet 10   EPINEPHRINE  0.3 mg/0.3 mL IJ SOAJ injection Inject 0.3 mLs (0.3 mg total) into the muscle as needed for anaphylaxis. 1 each 2   esomeprazole  (NEXIUM ) 20 MG capsule TAKE ONE (1) CAPSULE BY MOUTH ONCE DAILY 90 capsule 3   FARXIGA 5 MG TABS tablet Take 5 mg by mouth daily.     ferrous sulfate  325 (65 FE) MG tablet Take 1 tablet (325 mg total) by mouth at bedtime.     fluticasone  (FLONASE ) 50 MCG/ACT nasal spray USE 2 SPRAYS IN EACH NOSTRILS DAILY AS NEEDED (Patient taking differently: Place  2 sprays into both nostrils daily as needed for allergies or rhinitis.) 48 g 0   folic acid  (FOLVITE ) 1 MG tablet Take 1 tablet (1 mg total) by mouth daily.     furosemide  (LASIX ) 20 MG tablet Take 40 mg by mouth daily.     ipratropium-albuterol  (DUONEB) 0.5-2.5 (3) MG/3ML SOLN USE 1 VIAL VIA NEBULIZER EVERY 6 HOURS AS NEEDED FOR SHORTNESS OF BREATH (Patient taking differently: Take 3 mLs by nebulization every 6 (six) hours as needed (SOB).) 90 mL 0   levocetirizine (XYZAL) 5 MG tablet Take 5 mg by mouth daily as needed for allergies.     lidocaine  (LIDODERM ) 5 % Place 1 patch  onto the skin daily. Remove & Discard patch within 12 hours or as directed by MD     lidocaine -prilocaine  (EMLA ) cream Apply to affected area once (Patient taking differently: Apply 1 Application topically once. Apply to affected area once) 30 g 3   lubiprostone  (AMITIZA ) 24 MCG capsule TAKE 1 CAPSULE(24 MCG) BY MOUTH TWICE DAILY WITH A MEAL (Patient taking differently: Take 24 mcg by mouth 2 (two) times daily with a meal.) 180 capsule 1   methadone  (DOLOPHINE ) 10 MG tablet Take 1 tablet (10 mg total) by mouth every 8 (eight) hours. 75 tablet 0   methylPREDNISolone  (MEDROL  DOSEPAK) 4 MG TBPK tablet If significant rash, will start at 6 tabs on day 1, then 5 tabs on day 2, then 4 tabs on day 3, then 3 tabs on day 4, then 2 tabs on day 5 and then 1 tab on day 6. Take with food. 21 tablet 0   metoCLOPramide  (REGLAN ) 10 MG tablet Take 1 tablet (10 mg total) by mouth every 8 (eight) hours as needed for nausea. 45 tablet 1   Multiple Vitamin (MULTIVITAMIN WITH MINERALS) TABS tablet Take 1 tablet by mouth daily.     naloxone  (NARCAN ) nasal spray 4 mg/0.1 mL Place 1 spray into the nose once.     nitroGLYCERIN  (NITROSTAT ) 0.4 MG SL tablet Place 1 tablet (0.4 mg total) under the tongue every 5 (five) minutes as needed for chest pain. 25 tablet 6   ondansetron  (ZOFRAN ) 4 MG tablet Take 1 tablet (4 mg total) by mouth every 8 (eight) hours as needed for nausea or vomiting. 20 tablet 0   ondansetron  (ZOFRAN ) 8 MG tablet Take 1 tablet (8 mg total) by mouth every 8 (eight) hours as needed for nausea or vomiting. 30 tablet 1   Oxycodone  HCl 20 MG TABS Take 1 tablet (20 mg total) by mouth every 4 (four) hours as needed. 90 tablet 0   potassium chloride  (KLOR-CON  M) 10 MEQ tablet Take 2 tablets (20 mEq total) by mouth 2 (two) times daily. 180 tablet 1   prochlorperazine  (COMPAZINE ) 10 MG tablet TAKE 1 TABLET(10 MG) BY MOUTH EVERY 6 HOURS AS NEEDED FOR NAUSEA OR VOMITING 30 tablet 3   ranolazine  (RANEXA ) 500 MG 12 hr  tablet TAKE 1 TABLET BY MOUTH TWICE DAILY 60 tablet 10   rosuvastatin  (CRESTOR ) 20 MG tablet TAKE 1 TABLET BY MOUTH ONCE DAILY 90 tablet 1   sertraline  (ZOLOFT ) 100 MG tablet TAKE 1/2 TABLET(50 MG) BY MOUTH DAILY (Patient taking differently: Take 50 mg by mouth daily.) 45 tablet 1   sulfamethoxazole -trimethoprim  (BACTRIM  DS) 800-160 MG tablet Take 1 tablet by mouth 2 (two) times daily. 14 tablet 0   tiZANidine  (ZANAFLEX ) 4 MG tablet Take 1 tablet (4 mg total) by mouth at bedtime. 30 tablet 3  topiramate  (TOPAMAX ) 50 MG tablet TAKE TWO (2) TABLETS BY MOUTH EVERY DAY AT BEDTIME (Patient taking differently: Take 50 mg by mouth at bedtime. TAKE TWO (2) TABLETS BY MOUTH EVERY DAY AT BEDTIME) 60 tablet 10   Ubrogepant  (UBRELVY ) 100 MG TABS Take 1 tablet (100 mg total) by mouth as needed (May repeat after 2 hours.  Maximum 2 tablets in 24 hours). TAKE 1 TABLET BY MOUTH BY MOUTH AS NEEDED( MAY REPEAT 1 TABLET AFTER 2 HOURS IF NEEDED, MAXIMUM 2 TABLETS IN 24 HOURS) Strength: 100 mg 48 tablet 1   Vitamin D , Ergocalciferol , (DRISDOL ) 1.25 MG (50000 UNIT) CAPS capsule TAKE 1 CAPSULE BY MOUTH EVERY 7 DAYS FOR 12 DOSES (Patient taking differently: Take 50,000 Units by mouth every 7 (seven) days. Patient usually takes this medication on mondays) 12 capsule 0   No current facility-administered medications for this visit.   Facility-Administered Medications Ordered in Other Visits  Medication Dose Route Frequency Provider Last Rate Last Admin   0.9 %  sodium chloride  infusion   Intravenous Continuous Tina Pauletta BROCKS, MD 10 mL/hr at 06/07/24 1457 New Bag at 06/07/24 1457   enfortumab vedotin -ejfv (PADCEV ) 60 mg in sodium chloride  0.9 % 50 mL (1.0714 mg/mL) chemo infusion  1 mg/kg (Treatment Plan Recorded) Intravenous Once Tina Pauletta BROCKS, MD 112 mL/hr at 06/07/24 1552 60 mg at 06/07/24 1552   pembrolizumab  (KEYTRUDA ) 200 mg in sodium chloride  0.9 % 50 mL chemo infusion  200 mg Intravenous Once Tina Pauletta BROCKS, MD         VITAL SIGNS: There were no vitals taken for this visit. There were no vitals filed for this visit.  Estimated body mass index is 26.33 kg/m as calculated from the following:   Height as of 05/25/24: 4' 10 (1.473 m).   Weight as of an earlier encounter on 06/07/24: 126 lb (57.2 kg).  Assessment NAD, sitting comfortably in recliner  Normal breathing pattern RRR AAO x3  PERFORMANCE STATUS (ECOG) : 3 - Symptomatic, >50% confined to bed  IMPRESSION: Discussed the use of AI scribe software for clinical note transcription with the patient, who gave verbal consent to proceed.  History of Present Illness [name] is a 68 year old female with chronic pain and metastatic bladder cancer with T5 bone involvement who was seen during her infusion for symptom management follow-up. Denies concerns with uncontrolled nausea, vomiting, or constipation. Her appetite is doing much better. Current weight up to 126lbs.   She experiences difficulty sleeping at night, characterized by frequent awakenings and trouble returning to sleep. She has tried melatonin gummies in the past.  Recommended increasing to extra-strength 1 gummy per dose.    She reports neck stiffness, particularly when turning her head. She spends most of her days sitting in a recliner and does not use a neck pillow unless lying back. Advised to use heating pack for any neck discomfort.  Advised patient to move head around throughout the day versus maintaining in one position for long periods of time.  We discussed Ms. Westberg's pain at length. She reports pain is well controlled on current regimen which includes methadone  10mg  every 8 hours with oxycodone  20mg  as needed for severe and breakthrough pain. Tizanidine  as needed. No adjustments to current regimen.   We will continue to closely follow and support. All questions answered.    I discussed the importance of continued conversation with family and their medical providers  regarding overall plan of care and treatment options, ensuring  decisions are within the context of the patients values and GOCs. Assessment & Plan  Chronic pain management Chronic abdominal pain worsened prompting patient to restart methadone . Methadone  effective with regular oxycodone  use.  - Refill methadone , oxycodone , tizanidine . - Continue with oxycodone  to 20 mg every 4 hours as needed. - Continue Methadone  10mg  every 8 hours.   Cancer under active treatment Undergoing active cancer treatment, including chemotherapy. - Ensure attendance at the scheduled chemotherapy and infusion appointment on Friday.  Neck stiffness Neck stiffness likely due to prolonged sitting in a recliner and maintaining an upright head position, causing muscle tension. - Use a heating pad to relieve stiffness. - Encourage movement and changing positions throughout the day to reduce muscle tension.  Insomnia Insomnia characterized by frequent awakenings and difficulty returning to sleep. Melatonin gummies have been tried. Advised against using a vape for sleep due to inhalation risks. Melatonin in extra strength form discussed as a potential solution. - Advise against using vape for sleep due to inhalation risks. - Recommend trying melatonin in extra strength form, starting with one and increasing to two if necessary. - Consider using liquid melatonin drops under the tongue as an alternative to gummies.  Follow-up I will plan to see patient back in 4-6 weeks.   Patient expressed understanding and was in agreement with this plan. She also understands that She can call the clinic at any time with any questions, concerns, or complaints.   Any controlled substances utilized were prescribed in the context of palliative care. PDMP has been reviewed.   Visit consisted of counseling and education dealing with the complex and emotionally intense issues of symptom management and palliative care in the setting of  serious and potentially life-threatening illness.  Levon Borer, AGPCNP-BC  Palliative Medicine Team/ Cancer Center

## 2024-06-07 NOTE — Assessment & Plan Note (Addendum)
 Clinically stable Fluid goal 64 ounce a day. Monitor labs with each treatment

## 2024-06-08 ENCOUNTER — Other Ambulatory Visit: Payer: Self-pay

## 2024-06-08 ENCOUNTER — Telehealth: Payer: Self-pay

## 2024-06-08 ENCOUNTER — Telehealth: Payer: Self-pay | Admitting: Nurse Practitioner

## 2024-06-08 DIAGNOSIS — R630 Anorexia: Secondary | ICD-10-CM

## 2024-06-08 DIAGNOSIS — C679 Malignant neoplasm of bladder, unspecified: Secondary | ICD-10-CM

## 2024-06-08 MED ORDER — ENSURE NUTRITION SHAKE PO LIQD: ORAL | 12 refills | Status: DC

## 2024-06-08 NOTE — Telephone Encounter (Signed)
 The order for nutritional supplement faxed to pharmacy as requested by the daughter. Arland Legions BSN RN

## 2024-06-08 NOTE — Telephone Encounter (Signed)
 Spoke with patient to let her know that I am having trouble getting the fax to go through to her DME company but I will keep working on it. Arland Legions BSN RN

## 2024-06-08 NOTE — Telephone Encounter (Signed)
 Scheduled patient for next palliative appointment. Called and spoke with the patient, she is aware.

## 2024-06-10 DIAGNOSIS — J4489 Other specified chronic obstructive pulmonary disease: Secondary | ICD-10-CM | POA: Diagnosis not present

## 2024-06-10 DIAGNOSIS — F5101 Primary insomnia: Secondary | ICD-10-CM | POA: Diagnosis not present

## 2024-06-10 DIAGNOSIS — E782 Mixed hyperlipidemia: Secondary | ICD-10-CM | POA: Diagnosis not present

## 2024-06-10 DIAGNOSIS — C679 Malignant neoplasm of bladder, unspecified: Secondary | ICD-10-CM | POA: Diagnosis not present

## 2024-06-10 DIAGNOSIS — I700 Atherosclerosis of aorta: Secondary | ICD-10-CM | POA: Diagnosis not present

## 2024-06-10 DIAGNOSIS — D631 Anemia in chronic kidney disease: Secondary | ICD-10-CM | POA: Diagnosis not present

## 2024-06-10 DIAGNOSIS — N1832 Chronic kidney disease, stage 3b: Secondary | ICD-10-CM | POA: Diagnosis not present

## 2024-06-10 DIAGNOSIS — D520 Dietary folate deficiency anemia: Secondary | ICD-10-CM | POA: Diagnosis not present

## 2024-06-10 DIAGNOSIS — M8008XD Age-related osteoporosis with current pathological fracture, vertebra(e), subsequent encounter for fracture with routine healing: Secondary | ICD-10-CM | POA: Diagnosis not present

## 2024-06-10 DIAGNOSIS — I2510 Atherosclerotic heart disease of native coronary artery without angina pectoris: Secondary | ICD-10-CM | POA: Diagnosis not present

## 2024-06-10 DIAGNOSIS — I5042 Chronic combined systolic (congestive) and diastolic (congestive) heart failure: Secondary | ICD-10-CM | POA: Diagnosis not present

## 2024-06-10 DIAGNOSIS — M069 Rheumatoid arthritis, unspecified: Secondary | ICD-10-CM | POA: Diagnosis not present

## 2024-06-10 DIAGNOSIS — Z960 Presence of urogenital implants: Secondary | ICD-10-CM | POA: Diagnosis not present

## 2024-06-10 DIAGNOSIS — G8220 Paraplegia, unspecified: Secondary | ICD-10-CM | POA: Diagnosis not present

## 2024-06-10 DIAGNOSIS — Z7982 Long term (current) use of aspirin: Secondary | ICD-10-CM | POA: Diagnosis not present

## 2024-06-10 DIAGNOSIS — M4726 Other spondylosis with radiculopathy, lumbar region: Secondary | ICD-10-CM | POA: Diagnosis not present

## 2024-06-10 DIAGNOSIS — M47814 Spondylosis without myelopathy or radiculopathy, thoracic region: Secondary | ICD-10-CM | POA: Diagnosis not present

## 2024-06-10 DIAGNOSIS — D630 Anemia in neoplastic disease: Secondary | ICD-10-CM | POA: Diagnosis not present

## 2024-06-10 DIAGNOSIS — E440 Moderate protein-calorie malnutrition: Secondary | ICD-10-CM | POA: Diagnosis not present

## 2024-06-10 DIAGNOSIS — J9611 Chronic respiratory failure with hypoxia: Secondary | ICD-10-CM | POA: Diagnosis not present

## 2024-06-10 DIAGNOSIS — I420 Dilated cardiomyopathy: Secondary | ICD-10-CM | POA: Diagnosis not present

## 2024-06-11 ENCOUNTER — Ambulatory Visit: Payer: Self-pay

## 2024-06-11 DIAGNOSIS — J449 Chronic obstructive pulmonary disease, unspecified: Secondary | ICD-10-CM | POA: Diagnosis not present

## 2024-06-11 NOTE — Telephone Encounter (Signed)
    Message from Rantoul E sent at 06/11/2024  3:52 PM EDT  Summary: Ear infection still bothering her   Reason for Triage: Ear infection still bothering her, has completed the Rx she was given  Best contact: [phone]

## 2024-06-11 NOTE — Telephone Encounter (Signed)
 FYI Only or Action Required?: FYI only for provider.  Patient was last seen in primary care on 05/18/2024 by Teressa Harrie HERO, FNP.  Called Nurse Triage reporting Otalgia.  Symptoms began several weeks ago.  Interventions attempted: OTC medications: Aspirin  and Prescription medications: Augmentin .  Symptoms are: unchanged.  Triage Disposition: See Physician Within 24 Hours  Patient/caregiver understands and will follow disposition?: Yes                         Reason for Disposition  [1] Taking antibiotic > 48 hours (2 days) and [2] fever persists or recurs  Answer Assessment - Initial Assessment Questions 1. ANTIBIOTIC: What antibiotic are you taking? How many times per day?     Augmentin  2. ONSET: When was the antibiotic started?     05/18/24 3. LOCATION: Which ear is involved?     Bilateral ears, mostly right ear 4. PAIN: How bad is the pain?   (Scale 0-10; none, mild, moderate or severe)     About a 6 5. FEVER: Do you have a fever? If Yes, ask: What is your temperature, how was it measured, and when did it start?     99.0  6. DISCHARGE: Is there any discharge? If Yes, ask: What color is it? (e.g., clear, white; yellow, green; bloody)     Denies 7. OTHER SYMPTOMS: Do you have any other symptoms? (e.g., headache, stiff neck, dizziness, vomiting, runny nose)     Headache (rates pain about a 6), stiff neck- states symptom started 2 days after finishing antibiotic- she still has ROM of neck, pain radiates down right side of jaw, denies sinus/facial pain, denies cold/flu symptoms    Patient stated symptoms remained the same after completing antibiotic. This RN advised evaluation within 24 hours. No availability in office. Advised UC. Patient verbalized understanding and agreed to go to [facility] tomorrow.  Protocols used: Ear - Otitis Media Follow-up Call-A-[facility]

## 2024-06-12 ENCOUNTER — Ambulatory Visit (HOSPITAL_BASED_OUTPATIENT_CLINIC_OR_DEPARTMENT_OTHER): Payer: Self-pay

## 2024-06-13 NOTE — Progress Notes (Unsigned)
 Patient Care Team: Teressa Harrie HERO, FNP as PCP - General (Family Medicine) Inocencio Soyla Lunger, MD as PCP - Electrophysiology (Cardiology) Bernie Lamar PARAS, MD as PCP - Cardiology (Cardiology) Mavis Purchase, MD as Consulting Physician (Neurosurgery) Bernie Lamar PARAS, MD as Consulting Physician (Cardiology) Sheldon Standing, MD as Consulting Physician (General Surgery) Skeet Juliene SAUNDERS, DO as Consulting Physician (Neurology) Delores Lauraine NOVAK, [facility] (Inactive) as Pharmacist (Pharmacist) Saintclair Jasper, MD as Consulting Physician (Gastroenterology) Zehr, Harlene BIRCH, PA-C as Physician Assistant (Gastroenterology) Dennise Hoes, MD as Consulting Physician (Nephrology) Pickenpack-Cousar, Fannie SAILOR, NP as Nurse Practitioner [facility])  Clinic Day:  06/14/2024  Referring physician: Tina Pauletta BROCKS, MD  ASSESSMENT & PLAN:   Assessment & Plan: Urothelial carcinoma of bladder (HCC) Continue with reduced dose of EV to 1mg /kg Will try to continue day 8 as tolerated - [name] reports doing very well with only right heel pain with negative symptoms. Seeing palliative care as well for chronic pain management PET after next cycle scheduled at the end of October.  This is scheduled for 06/24/2024. [name] has appointment with Dr. Tina on 11/4 to go over results. Follow up with next cycle.   Anemia Mild and stable with Hgb 9.4 and HCT 27.6.  No requirement for blood transfusion or iron infusion today..  Will continue to monitor with each visit.  Treat as indicated.  Plan Patient seen in infusion suite.   Labs reviewed. - Stable anemia. - Na is 131, K is 3.4.  CMP otherwise unremarkable. Patient labs and presentation are appropriate for treatment today. Proceed with cycle 8 day 9 treatment with enfortumab vedotin .  Restaging PET scan scheduled for 06/24/2024. Labs/flush, follow-up, and cycle 9 day 1 treatment with pembrolizumab  and Enfortumab as scheduled.   The patient understands  the plans discussed today and is in agreement with them.  [name] knows to contact our office if [name] develops concerns prior to her next appointment.  I provided 20 minutes of face-to-face time during this encounter and > 50% was spent counseling as documented under my assessment and plan.    Powell FORBES Lessen, NP  [facility] - A DEPT OF [facility] [address] [place] [address] Dept: [phone] Dept Fax: [phone]   No orders of the defined types were placed in this encounter.     CHIEF COMPLAINT:  CC: Bladder cancer  Current Treatment: Enfortumab on D1 and D8; pembrolizumab  D1 every 21 days   INTERVAL HISTORY:  [name] is here today for repeat clinical assessment.  [name] last saw Dr. Tina on 06/07/2024.  Today, [name] presents for cycle 8 day 9 Enfortumab.  [name] is reporting right heel pain.  Otherwise doing fine with no changes.  [name] denies skin rash or skin breakdown.  [name] denies peripheral neuropathy.  [name] denies chest pain, chest pressure, or shortness of breath. [name] denies headaches or visual disturbances. [name] denies abdominal pain, nausea, vomiting, or changes in bowel or bladder habits.  [name] denies fevers or chills. [name] denies pain. Her appetite is good.    I have reviewed the past medical history, past surgical history, social history and family history with the patient and they are unchanged from previous note.  ALLERGIES:  is allergic to lyrica  [pregabalin ].  MEDICATIONS:  Current Outpatient Medications  Medication Sig Dispense Refill   ARIPiprazole  (ABILIFY ) 5 MG tablet TAKE 1 TABLET BY MOUTH DAILY 30 tablet 10   aspirin  81 MG  tablet Take 81 mg by mouth daily.     BREO ELLIPTA  100-25 MCG/ACT AEPB INHALE ONE (1) PUFF BY MOUTH DAILY 60 each 11   Carboxymethylcell-Glycerin  PF 0.5-0.9 % SOLN Place 2 drops into both eyes 4 (four) times daily. 1 each 11   carvedilol  (COREG ) 3.125 MG tablet TAKE ONE (1)  TABLET BY MOUTH TWICE DAILY WITH MEALS (Patient taking differently: Take 3.125 mg by mouth 2 (two) times daily with a meal.) 60 tablet 10   denosumab  (PROLIA ) 60 MG/ML SOSY injection Inject 60 mg into the skin every 6 (six) months. 180 mL 2   dextromethorphan  15 MG/5ML syrup Take 10 mLs (30 mg total) by mouth 4 (four) times daily as needed for cough. 120 mL 0   diclofenac  Sodium (VOLTAREN ) 1 % GEL Apply 2 g topically 4 (four) times daily. 50 g 4   diphenoxylate -atropine  (LOMOTIL ) 2.5-0.025 MG tablet Take 1 tablet by mouth 4 (four) times daily as needed for diarrhea or loose stools. 60 tablet 0   dronabinol  (MARINOL ) 5 MG capsule Take 1 capsule (5 mg total) by mouth 2 (two) times daily before lunch and supper. 60 capsule 0   ENTRESTO  24-26 MG TAKE ONE (1) TABLET BY MOUTH TWICE DAILY 60 tablet 10   EPINEPHRINE  0.3 mg/0.3 mL IJ SOAJ injection Inject 0.3 mLs (0.3 mg total) into the muscle as needed for anaphylaxis. 1 each 2   esomeprazole  (NEXIUM ) 20 MG capsule TAKE ONE (1) CAPSULE BY MOUTH ONCE DAILY 90 capsule 3   FARXIGA 5 MG TABS tablet Take 5 mg by mouth daily.     ferrous sulfate  325 (65 FE) MG tablet Take 1 tablet (325 mg total) by mouth at bedtime.     fluticasone  (FLONASE ) 50 MCG/ACT nasal spray USE 2 SPRAYS IN EACH NOSTRILS DAILY AS NEEDED (Patient taking differently: Place 2 sprays into both nostrils daily as needed for allergies or rhinitis.) 48 g 0   folic acid  (FOLVITE ) 1 MG tablet Take 1 tablet (1 mg total) by mouth daily.     furosemide  (LASIX ) 20 MG tablet Take 40 mg by mouth daily.     ipratropium-albuterol  (DUONEB) 0.5-2.5 (3) MG/3ML SOLN USE 1 VIAL VIA NEBULIZER EVERY 6 HOURS AS NEEDED FOR SHORTNESS OF BREATH (Patient taking differently: Take 3 mLs by nebulization every 6 (six) hours as needed (SOB).) 90 mL 0   levocetirizine (XYZAL) 5 MG tablet Take 5 mg by mouth daily as needed for allergies.     lidocaine  (LIDODERM ) 5 % Place 1 patch onto the skin daily. Remove & Discard patch  within 12 hours or as directed by MD     lidocaine -prilocaine  (EMLA ) cream Apply to affected area once (Patient taking differently: Apply 1 Application topically once. Apply to affected area once) 30 g 3   lubiprostone  (AMITIZA ) 24 MCG capsule TAKE 1 CAPSULE(24 MCG) BY MOUTH TWICE DAILY WITH A MEAL (Patient taking differently: Take 24 mcg by mouth 2 (two) times daily with a meal.) 180 capsule 1   methadone  (DOLOPHINE ) 10 MG tablet Take 1 tablet (10 mg total) by mouth every 8 (eight) hours. 75 tablet 0   methylPREDNISolone  (MEDROL  DOSEPAK) 4 MG TBPK tablet If significant rash, will start at 6 tabs on day 1, then 5 tabs on day 2, then 4 tabs on day 3, then 3 tabs on day 4, then 2 tabs on day 5 and then 1 tab on day 6. Take with food. 21 tablet 0   metoCLOPramide  (REGLAN ) 10 MG tablet Take  1 tablet (10 mg total) by mouth every 8 (eight) hours as needed for nausea. 45 tablet 1   Multiple Vitamin (MULTIVITAMIN WITH MINERALS) TABS tablet Take 1 tablet by mouth daily.     naloxone  (NARCAN ) nasal spray 4 mg/0.1 mL Place 1 spray into the nose once.     nitroGLYCERIN  (NITROSTAT ) 0.4 MG SL tablet Place 1 tablet (0.4 mg total) under the tongue every 5 (five) minutes as needed for chest pain. 25 tablet 6   ondansetron  (ZOFRAN ) 4 MG tablet Take 1 tablet (4 mg total) by mouth every 8 (eight) hours as needed for nausea or vomiting. 20 tablet 0   ondansetron  (ZOFRAN ) 8 MG tablet Take 1 tablet (8 mg total) by mouth every 8 (eight) hours as needed for nausea or vomiting. 30 tablet 1   Oxycodone  HCl 20 MG TABS Take 1 tablet (20 mg total) by mouth every 4 (four) hours as needed. 90 tablet 0   potassium chloride  (KLOR-CON  M) 10 MEQ tablet Take 2 tablets (20 mEq total) by mouth 2 (two) times daily. 180 tablet 1   prochlorperazine  (COMPAZINE ) 10 MG tablet TAKE 1 TABLET(10 MG) BY MOUTH EVERY 6 HOURS AS NEEDED FOR NAUSEA OR VOMITING 30 tablet 3   ranolazine  (RANEXA ) 500 MG 12 hr tablet TAKE 1 TABLET BY MOUTH TWICE DAILY 60  tablet 10   rosuvastatin  (CRESTOR ) 20 MG tablet TAKE 1 TABLET BY MOUTH ONCE DAILY 90 tablet 1   sertraline  (ZOLOFT ) 100 MG tablet TAKE 1/2 TABLET(50 MG) BY MOUTH DAILY (Patient taking differently: Take 50 mg by mouth daily.) 45 tablet 1   sulfamethoxazole -trimethoprim  (BACTRIM  DS) 800-160 MG tablet Take 1 tablet by mouth 2 (two) times daily. 14 tablet 0   tiZANidine  (ZANAFLEX ) 4 MG tablet Take 1 tablet (4 mg total) by mouth at bedtime. 30 tablet 3   topiramate  (TOPAMAX ) 50 MG tablet TAKE TWO (2) TABLETS BY MOUTH EVERY DAY AT BEDTIME (Patient taking differently: Take 50 mg by mouth at bedtime. TAKE TWO (2) TABLETS BY MOUTH EVERY DAY AT BEDTIME) 60 tablet 10   Ubrogepant  (UBRELVY ) 100 MG TABS Take 1 tablet (100 mg total) by mouth as needed (May repeat after 2 hours.  Maximum 2 tablets in 24 hours). TAKE 1 TABLET BY MOUTH BY MOUTH AS NEEDED( MAY REPEAT 1 TABLET AFTER 2 HOURS IF NEEDED, MAXIMUM 2 TABLETS IN 24 HOURS) Strength: 100 mg 48 tablet 1   Vitamin D , Ergocalciferol , (DRISDOL ) 1.25 MG (50000 UNIT) CAPS capsule TAKE 1 CAPSULE BY MOUTH EVERY 7 DAYS FOR 12 DOSES (Patient taking differently: Take 50,000 Units by mouth every 7 (seven) days. Patient usually takes this medication on mondays) 12 capsule 0   No current facility-administered medications for this visit.   Facility-Administered Medications Ordered in Other Visits  Medication Dose Route Frequency Provider Last Rate Last Admin   0.9 %  sodium chloride  infusion   Intravenous Continuous Tina Pauletta BROCKS, MD 10 mL/hr at 06/14/24 1553 New Bag at 06/14/24 1553    HISTORY OF PRESENT ILLNESS:   Oncology History  Urothelial carcinoma of bladder (HCC)  11/18/2023 Initial Diagnosis   Urothelial carcinoma of bladder (HCC)   12/01/2023 Cancer Staging   Staging form: Urinary Bladder, AJCC 8th Edition - Clinical: Stage IVB (cTX, cNX, pM1b) - Signed by Tina Pauletta BROCKS, MD on 12/01/2023 WHO/ISUP grade (low/high): High Grade Histologic grading system: 2  grade system   12/18/2023 -  Chemotherapy   12/18/23 C1D1 01/08/24 C2D1 01/29/24 C3D1. Pembro only. Day 8 reduced  EV to 1mg /kg Patient is on Treatment Plan : UROTHELIAL ADVANCED, METASTATIC ENFORTUMAB D1, D8 + PEMBROLIZUMAB  (200) D1 Q21D      02/12/2024 PET scan   PET 1. Response to therapy of osseous metastasis. 2. The pelvis is again poorly evaluated. Soft tissue density within the deep pelvis is less distinct today with decreased hypermetabolism. Potential etiologies again include response to therapy of a synchronous uterine primary versus isolated metastasis (presuming the patient is status post hysterectomy). 3. Placement of a right ureteric stent with similar mild right-sided hydronephrosis. The left-sided hydroureteronephrosis has resolved. 4.  Aortic Atherosclerosis (ICD10-I70.0).       REVIEW OF SYSTEMS:   Constitutional: Denies fevers, chills or abnormal weight loss Eyes: Denies blurriness of vision Ears, nose, mouth, throat, and face: Denies mucositis or sore throat Respiratory: Denies cough, dyspnea or wheezes Cardiovascular: Denies palpitation, or chest discomfort.  [name] has mild lower extremity edema which [name] states is her baseline. Gastrointestinal:  Denies nausea, heartburn or change in bowel habits Skin: Denies abnormal skin rashes Lymphatics: Denies new lymphadenopathy or easy bruising Neurological:Denies numbness, tingling or new weaknesses Behavioral/Psych: Mood is stable, no new changes  Musculoskeletal: Patient reports right heel pain.  Tender even with light palpation. All other systems were reviewed with the patient and are negative.   VITALS:   Today's Vitals   06/14/24 1625  BP: 105/69  Pulse: 83  Resp: 16  Temp: (!) 97 F (36.1 C)  SpO2: 100%  Weight: 125 lb (56.7 kg)   Body mass index is 26.13 kg/m.    Wt Readings from Last 3 Encounters:  06/14/24 125 lb 12.8 oz (57.1 kg)  06/14/24 125 lb (56.7 kg)  06/07/24 126 lb (57.2 kg)    Body  mass index is 26.13 kg/m.  Performance status (ECOG): 1 - Symptomatic but completely ambulatory  PHYSICAL EXAM:   GENERAL:alert, no distress and comfortable SKIN: skin color, texture, turgor are normal, no rashes or significant lesions.   EYES: normal, Conjunctiva are pink and non-injected, sclera clear OROPHARYNX:no exudate, no erythema and lips, buccal mucosa, and tongue normal  NECK: supple, thyroid  normal size, non-tender, without nodularity LYMPH:  no palpable lymphadenopathy in the cervical, axillary or inguinal LUNGS: clear to auscultation and percussion with normal breathing effort HEART: regular rate & rhythm and no murmurs.  There is mild, 1+ pitting edema in bilateral ankles. ABDOMEN:abdomen soft, non-tender and normal bowel sounds Musculoskeletal:no cyanosis of digits and no clubbing.  The right heel is tender with light palpation.  There is no redness, warmth, or swelling noted.  There is no bony abnormalities or crepitus that is palpable during today's visit.  There is no skin breakdown noted on the posterior aspect of the right heel. NEURO: alert & oriented x 3 with fluent speech, no focal motor/sensory deficits  LABORATORY DATA:  I have reviewed the data as listed    Component Value Date/Time   NA 131 (L) 06/14/2024 1423   NA 139 09/25/2023 1449   K 3.4 (L) 06/14/2024 1423   CL 99 06/14/2024 1423   CO2 24 06/14/2024 1423   GLUCOSE 92 06/14/2024 1423   BUN 28 (H) 06/14/2024 1423   BUN 32 (H) 09/25/2023 1449   CREATININE 1.55 (H) 06/14/2024 1423   CALCIUM  9.6 06/14/2024 1423   PROT 6.8 06/14/2024 1423   PROT 6.5 09/25/2023 1449   ALBUMIN  3.5 06/14/2024 1423   ALBUMIN  4.3 09/25/2023 1449   AST 15 06/14/2024 1423   ALT 7 06/14/2024 1423  ALKPHOS 57 06/14/2024 1423   BILITOT 0.4 06/14/2024 1423   GFRNONAA 36 (L) 06/14/2024 1423   GFRAA 57 (L) 09/05/2020 1138    Lab Results  Component Value Date   WBC 7.2 06/14/2024   NEUTROABS 4.5 06/14/2024   HGB 9.4 (L)  06/14/2024   HCT 27.6 (L) 06/14/2024   MCV 88.5 06/14/2024   PLT 249 06/14/2024     RADIOGRAPHIC STUDIES: CUP PACEART REMOTE DEVICE CHECK Result Date: 06/03/2024 ICD Scheduled remote reviewed. Normal device function.  Presenting rhythm: AS/VS 2 NSVT, V>A, HR's 185-200, 8-16 beats in duration Next remote transmission per protocol. LA, CVRS  IR Nephro Tube Remov/FL Result Date: 06/02/2024 INDICATION: Tube no longer needed Briefly, 69 year old female with a history of the urothelial carcinoma and distal ureteral obstruction s/p nephrostomy (11/16/2023), then internalization with double-J stent placement (12/24/2023). Patient lost to follow-up and presents after extended capping trial for nephrostomy tube removal. EXAM: FLUOROSCOPIC GUIDED RIGHT NEPHROSTOMY CATHETER REMOVAL COMPARISON:  IR fluoroscopy, 12/24/2023.  PET-CT, 02/12/2024. CONTRAST:  10 mL Isovue-300 administered into the collecting system FLUOROSCOPY: Radiation Exposure Index and estimated peak skin dose (PSD); Reference air kerma (RAK), 2 mGy. COMPLICATIONS: None immediate. TECHNIQUE: Informed written consent was obtained from the patient and/or patient's representative after a discussion of the risks, benefits and alternatives to treatment. Questions regarding the procedure were encouraged and answered. A timeout was performed prior to the initiation of the procedure. The RIGHT flank and external portion of existing nephrostomy catheter were prepped and draped in the usual sterile fashion. A sterile drape was applied covering the operative field. Maximum barrier sterile technique with sterile gowns and gloves were used for the procedure. A timeout was performed prior to the initiation of the procedure. A pre procedural spot fluoroscopic image was obtained after contrast was injected via the existing nephrostomy catheter demonstrating appropriate positioning within the renal pelvis. The existing nephrostomy catheter was cut and cannulated with  a Benson wire which was coiled within the renal pelvis. Under intermittent fluoroscopic guidance, the existing nephrostomy catheter was removed in its entirety. The indwelling ureteral stent remained in place. A dressing was placed. The patient tolerated the procedure well without immediate postprocedural complication. FINDINGS: The existing nephrostomy catheter was capped and is appropriately positioned. After successful fluoroscopic guided removal, the indwelling ureteral stent remained appropriately positioned with end coiled and locked within the RIGHT renal pelvis and urinary bladder. IMPRESSION: Successful fluoroscopic guided removal of a RIGHT percutaneous nephrostomy catheter. The patient was encouraged to contact their urologist office for routine ureteral stent exchange. Thom Hall, MD [facility] Electronically Signed   By: Thom Hall M.D.   On: 06/02/2024 14:22

## 2024-06-13 NOTE — Assessment & Plan Note (Signed)
 Continue with reduced dose of EV to 1mg /kg Will try to continue day 8 as tolerated Seeing palliative care as well for chronic pain management PET after next cycle scheduled at the end of October.  She has appointment with me on 11/4 to go over results. Follow up with next cycle.

## 2024-06-14 ENCOUNTER — Other Ambulatory Visit (HOSPITAL_COMMUNITY): Payer: Self-pay

## 2024-06-14 ENCOUNTER — Inpatient Hospital Stay (HOSPITAL_BASED_OUTPATIENT_CLINIC_OR_DEPARTMENT_OTHER): Admitting: Nurse Practitioner

## 2024-06-14 ENCOUNTER — Inpatient Hospital Stay

## 2024-06-14 ENCOUNTER — Encounter: Payer: Self-pay | Admitting: Nurse Practitioner

## 2024-06-14 ENCOUNTER — Other Ambulatory Visit: Payer: Self-pay | Admitting: Nurse Practitioner

## 2024-06-14 VITALS — BP 105/69 | HR 83 | Temp 97.0°F | Resp 16 | Wt 125.0 lb

## 2024-06-14 VITALS — BP 105/69 | HR 83 | Temp 97.0°F | Resp 16 | Wt 125.8 lb

## 2024-06-14 DIAGNOSIS — D649 Anemia, unspecified: Secondary | ICD-10-CM

## 2024-06-14 DIAGNOSIS — C679 Malignant neoplasm of bladder, unspecified: Secondary | ICD-10-CM

## 2024-06-14 DIAGNOSIS — G893 Neoplasm related pain (acute) (chronic): Secondary | ICD-10-CM

## 2024-06-14 DIAGNOSIS — Z5112 Encounter for antineoplastic immunotherapy: Secondary | ICD-10-CM | POA: Diagnosis not present

## 2024-06-14 DIAGNOSIS — Z515 Encounter for palliative care: Secondary | ICD-10-CM

## 2024-06-14 DIAGNOSIS — M5489 Other dorsalgia: Secondary | ICD-10-CM

## 2024-06-14 LAB — CMP (CANCER CENTER ONLY)
ALT: 7 U/L (ref 0–44)
AST: 15 U/L (ref 15–41)
Albumin: 3.5 g/dL (ref 3.5–5.0)
Alkaline Phosphatase: 57 U/L (ref 38–126)
Anion gap: 8 (ref 5–15)
BUN: 28 mg/dL — ABNORMAL HIGH (ref 8–23)
CO2: 24 mmol/L (ref 22–32)
Calcium: 9.6 mg/dL (ref 8.9–10.3)
Chloride: 99 mmol/L (ref 98–111)
Creatinine: 1.55 mg/dL — ABNORMAL HIGH (ref 0.44–1.00)
GFR, Estimated: 36 mL/min — ABNORMAL LOW (ref >60)
Glucose, Bld: 92 mg/dL (ref 70–99)
Potassium: 3.4 mmol/L — ABNORMAL LOW (ref 3.5–5.1)
Sodium: 131 mmol/L — ABNORMAL LOW (ref 135–145)
Total Bilirubin: 0.4 mg/dL (ref 0.0–1.2)
Total Protein: 6.8 g/dL (ref 6.5–8.1)

## 2024-06-14 LAB — CBC WITH DIFFERENTIAL (CANCER CENTER ONLY)
Abs Immature Granulocytes: 0.04 K/uL (ref 0.00–0.07)
Basophils Absolute: 0.1 K/uL (ref 0.0–0.1)
Basophils Relative: 1 %
Eosinophils Absolute: 0.6 K/uL — ABNORMAL HIGH (ref 0.0–0.5)
Eosinophils Relative: 8 %
HCT: 27.6 % — ABNORMAL LOW (ref 36.0–46.0)
Hemoglobin: 9.4 g/dL — ABNORMAL LOW (ref 12.0–15.0)
Immature Granulocytes: 1 %
Lymphocytes Relative: 19 %
Lymphs Abs: 1.3 K/uL (ref 0.7–4.0)
MCH: 30.1 pg (ref 26.0–34.0)
MCHC: 34.1 g/dL (ref 30.0–36.0)
MCV: 88.5 fL (ref 80.0–100.0)
Monocytes Absolute: 0.6 K/uL (ref 0.1–1.0)
Monocytes Relative: 9 %
Neutro Abs: 4.5 K/uL (ref 1.7–7.7)
Neutrophils Relative %: 62 %
Platelet Count: 249 K/uL (ref 150–400)
RBC: 3.12 MIL/uL — ABNORMAL LOW (ref 3.87–5.11)
RDW: 16.2 % — ABNORMAL HIGH (ref 11.5–15.5)
WBC Count: 7.2 K/uL (ref 4.0–10.5)
nRBC: 0 % (ref 0.0–0.2)

## 2024-06-14 LAB — MISCELLANEOUS TEST

## 2024-06-14 MED ORDER — METHADONE HCL 10 MG PO TABS
10.0000 mg | ORAL_TABLET | Freq: Three times a day (TID) | ORAL | 0 refills | Status: DC
  Filled 2024-06-14: qty 75, 25d supply, fill #0

## 2024-06-14 MED ORDER — SODIUM CHLORIDE 0.9 % IV SOLN: INTRAVENOUS | Status: DC

## 2024-06-14 MED ORDER — SODIUM CHLORIDE 0.9 % IV SOLN
1.0000 mg/kg | Freq: Once | INTRAVENOUS | Status: AC
  Administered 2024-06-14: 60 mg via INTRAVENOUS
  Filled 2024-06-14: qty 6

## 2024-06-14 MED ORDER — PROCHLORPERAZINE MALEATE 10 MG PO TABS
10.0000 mg | ORAL_TABLET | Freq: Once | ORAL | Status: AC
  Administered 2024-06-14: 10 mg via ORAL
  Filled 2024-06-14: qty 1

## 2024-06-14 NOTE — Progress Notes (Signed)
 Nutrition  Message received from [facility] regarding request for assistance with oral nutrition supplements.  [facility] does not accept [employer] Medicare insurant and will not be able to assist patient with oral nutrition supplements.  [facility] representative notified patient.    Brita Jurgensen B. Dasie SOLON, CSO, LDN Registered Dietitian [phone]

## 2024-06-14 NOTE — Patient Instructions (Signed)
 [facility] - A DEPT OF [facility]  Discharge Instructions: Thank you for choosing [facility] to provide your oncology and hematology care.   If you have a lab appointment with the Cancer Center, please go directly to the Cancer Center and check in at the registration area.   Wear comfortable clothing and clothing appropriate for easy access to any Portacath or PICC line.   We strive to give you quality time with your provider. You may need to reschedule your appointment if you arrive late (15 or more minutes).  Arriving late affects you and other patients whose appointments are after yours.  Also, if you miss three or more appointments without notifying the office, you may be dismissed from the clinic at the provider's discretion.      For prescription refill requests, have your pharmacy contact our office and allow 72 hours for refills to be completed.    Today you received the following chemotherapy and/or immunotherapy agents: Enfortumab vedotin -ejfv (Padcev )    To help prevent nausea and vomiting after your treatment, we encourage you to take your nausea medication as directed.  BELOW ARE SYMPTOMS THAT SHOULD BE REPORTED IMMEDIATELY: *FEVER GREATER THAN 100.4 F (38 C) OR HIGHER *CHILLS OR SWEATING *NAUSEA AND VOMITING THAT IS NOT CONTROLLED WITH YOUR NAUSEA MEDICATION *UNUSUAL SHORTNESS OF BREATH *UNUSUAL BRUISING OR BLEEDING *URINARY PROBLEMS (pain or burning when urinating, or frequent urination) *BOWEL PROBLEMS (unusual diarrhea, constipation, pain near the anus) TENDERNESS IN MOUTH AND THROAT WITH OR WITHOUT PRESENCE OF ULCERS (sore throat, sores in mouth, or a toothache) UNUSUAL RASH, SWELLING OR PAIN  UNUSUAL VAGINAL DISCHARGE OR ITCHING   Items with * indicate a potential emergency and should be followed up as soon as possible or go to the Emergency Department if any problems should occur.  Please show the CHEMOTHERAPY ALERT  CARD or IMMUNOTHERAPY ALERT CARD at check-in to the Emergency Department and triage nurse.  Should you have questions after your visit or need to cancel or reschedule your appointment, please contact [facility] - A DEPT OF [facility]  Dept: [phone]  and follow the prompts.  Office hours are 8:00 a.m. to 4:30 p.m. Monday - Friday. Please note that voicemails left after 4:00 p.m. may not be returned until the following business day.  We are closed weekends and major holidays. You have access to a nurse at all times for urgent questions. Please call the main number to the clinic Dept: [phone] and follow the prompts.   For any non-urgent questions, you may also contact your provider using MyChart. We now offer e-Visits for anyone 39 and older to request care online for non-urgent symptoms. For details visit [email or web address].   Also download the MyChart app! Go to the app store, search MyChart, open the app, select [place], and log in with your MyChart username and password.

## 2024-06-16 ENCOUNTER — Ambulatory Visit

## 2024-06-16 ENCOUNTER — Other Ambulatory Visit

## 2024-06-16 DIAGNOSIS — M47814 Spondylosis without myelopathy or radiculopathy, thoracic region: Secondary | ICD-10-CM | POA: Diagnosis not present

## 2024-06-16 DIAGNOSIS — D631 Anemia in chronic kidney disease: Secondary | ICD-10-CM | POA: Diagnosis not present

## 2024-06-16 DIAGNOSIS — N1832 Chronic kidney disease, stage 3b: Secondary | ICD-10-CM | POA: Diagnosis not present

## 2024-06-16 DIAGNOSIS — I2510 Atherosclerotic heart disease of native coronary artery without angina pectoris: Secondary | ICD-10-CM | POA: Diagnosis not present

## 2024-06-16 DIAGNOSIS — D630 Anemia in neoplastic disease: Secondary | ICD-10-CM | POA: Diagnosis not present

## 2024-06-16 DIAGNOSIS — D520 Dietary folate deficiency anemia: Secondary | ICD-10-CM | POA: Diagnosis not present

## 2024-06-16 DIAGNOSIS — E440 Moderate protein-calorie malnutrition: Secondary | ICD-10-CM | POA: Diagnosis not present

## 2024-06-16 DIAGNOSIS — G8220 Paraplegia, unspecified: Secondary | ICD-10-CM | POA: Diagnosis not present

## 2024-06-16 DIAGNOSIS — I700 Atherosclerosis of aorta: Secondary | ICD-10-CM | POA: Diagnosis not present

## 2024-06-16 DIAGNOSIS — M8008XD Age-related osteoporosis with current pathological fracture, vertebra(e), subsequent encounter for fracture with routine healing: Secondary | ICD-10-CM | POA: Diagnosis not present

## 2024-06-16 DIAGNOSIS — F5101 Primary insomnia: Secondary | ICD-10-CM | POA: Diagnosis not present

## 2024-06-16 DIAGNOSIS — Z960 Presence of urogenital implants: Secondary | ICD-10-CM | POA: Diagnosis not present

## 2024-06-16 DIAGNOSIS — Z7982 Long term (current) use of aspirin: Secondary | ICD-10-CM | POA: Diagnosis not present

## 2024-06-16 DIAGNOSIS — C679 Malignant neoplasm of bladder, unspecified: Secondary | ICD-10-CM | POA: Diagnosis not present

## 2024-06-16 DIAGNOSIS — I5042 Chronic combined systolic (congestive) and diastolic (congestive) heart failure: Secondary | ICD-10-CM | POA: Diagnosis not present

## 2024-06-16 DIAGNOSIS — J9611 Chronic respiratory failure with hypoxia: Secondary | ICD-10-CM | POA: Diagnosis not present

## 2024-06-16 DIAGNOSIS — M069 Rheumatoid arthritis, unspecified: Secondary | ICD-10-CM | POA: Diagnosis not present

## 2024-06-16 DIAGNOSIS — J4489 Other specified chronic obstructive pulmonary disease: Secondary | ICD-10-CM | POA: Diagnosis not present

## 2024-06-16 DIAGNOSIS — I420 Dilated cardiomyopathy: Secondary | ICD-10-CM | POA: Diagnosis not present

## 2024-06-16 DIAGNOSIS — E782 Mixed hyperlipidemia: Secondary | ICD-10-CM | POA: Diagnosis not present

## 2024-06-16 DIAGNOSIS — M4726 Other spondylosis with radiculopathy, lumbar region: Secondary | ICD-10-CM | POA: Diagnosis not present

## 2024-06-17 ENCOUNTER — Ambulatory Visit: Payer: Self-pay

## 2024-06-17 DIAGNOSIS — C7951 Secondary malignant neoplasm of bone: Secondary | ICD-10-CM | POA: Diagnosis not present

## 2024-06-17 DIAGNOSIS — C673 Malignant neoplasm of anterior wall of bladder: Secondary | ICD-10-CM | POA: Diagnosis not present

## 2024-06-17 NOTE — Telephone Encounter (Signed)
 FYI Only or Action Required?: FYI only for provider.  Patient was last seen in primary care on 05/18/2024 by Teressa Harrie HERO, FNP.  Called Nurse Triage reporting Ear Pain.  Symptoms began about a month ago.  Interventions attempted: Prescription medications: reports finished RX from PCP without relief.  Symptoms are: unchanged.  Triage Disposition: See PCP Within 2 Weeks  Patient/caregiver understands and will follow disposition?: Yes           Copied from CRM [id number]. Topic: Clinical - Red Word Triage >> Jun 17, 2024  4:40 PM Terri G wrote: Red Word that prompted transfer to Nurse Triage: Patient has been experiencing pain in her right ear. Reason for Disposition  Ear congestion is a chronic symptom (recurrent or ongoing AND present > 4 weeks)  Answer Assessment - Initial Assessment Questions 1. LOCATION: Which ear is involved?       R ear 2. SENSATION: Describe how the ear feels. (e.g., stuffy, full, plugged).      Pain, stuffy 3. ONSET:  When did the ear symptoms start?       X 1 month - reports finished Rx from PCP that did not resolve sx 4. PAIN: Do you also have an earache? If Yes, ask: How bad is it? (Scale 0-10; none, mild, moderate or severe)     6/10 5. CAUSE: What do you think is causing the ear congestion? (e.g., common cold, nasal allergies, recent flight, recent snorkeling)     Denies but endorses going to Chemo on Monday 6. OTHER SYMPTOMS: Do you have any other symptoms? (e.g., ear drainage, hay fever symptoms such as sneezing or a clear nasal discharge; cold symptoms such as a cough or runny nose)     denies 7. PREGNANCY: Is there any chance you are pregnant? When was your last menstrual period?     N/a  Protocols used: Ear - Congestion-A-AH

## 2024-06-21 ENCOUNTER — Other Ambulatory Visit (HOSPITAL_COMMUNITY): Payer: Self-pay

## 2024-06-21 ENCOUNTER — Other Ambulatory Visit: Payer: Self-pay

## 2024-06-21 ENCOUNTER — Ambulatory Visit: Admitting: Family Medicine

## 2024-06-21 DIAGNOSIS — G893 Neoplasm related pain (acute) (chronic): Secondary | ICD-10-CM

## 2024-06-21 DIAGNOSIS — Z515 Encounter for palliative care: Secondary | ICD-10-CM

## 2024-06-21 DIAGNOSIS — R197 Diarrhea, unspecified: Secondary | ICD-10-CM

## 2024-06-21 DIAGNOSIS — C679 Malignant neoplasm of bladder, unspecified: Secondary | ICD-10-CM

## 2024-06-21 MED ORDER — OXYCODONE HCL 20 MG PO TABS
20.0000 mg | ORAL_TABLET | ORAL | 0 refills | Status: DC | PRN
  Filled 2024-06-21: qty 90, 15d supply, fill #0

## 2024-06-21 MED ORDER — DIPHENOXYLATE-ATROPINE 2.5-0.025 MG PO TABS
1.0000 | ORAL_TABLET | Freq: Four times a day (QID) | ORAL | 0 refills | Status: DC | PRN
  Filled 2024-06-21: qty 60, 15d supply, fill #0

## 2024-06-22 ENCOUNTER — Encounter: Payer: Self-pay | Admitting: Family Medicine

## 2024-06-24 ENCOUNTER — Other Ambulatory Visit: Payer: Self-pay | Admitting: Cardiology

## 2024-06-24 ENCOUNTER — Encounter (HOSPITAL_COMMUNITY): Admission: RE | Admit: 2024-06-24 | Source: Ambulatory Visit

## 2024-06-29 ENCOUNTER — Inpatient Hospital Stay

## 2024-06-29 ENCOUNTER — Inpatient Hospital Stay (HOSPITAL_BASED_OUTPATIENT_CLINIC_OR_DEPARTMENT_OTHER): Admitting: Nurse Practitioner

## 2024-06-29 ENCOUNTER — Inpatient Hospital Stay: Admitting: Dietician

## 2024-06-29 ENCOUNTER — Encounter: Payer: Self-pay | Admitting: Nurse Practitioner

## 2024-06-29 ENCOUNTER — Other Ambulatory Visit (HOSPITAL_COMMUNITY): Payer: Self-pay

## 2024-06-29 ENCOUNTER — Inpatient Hospital Stay (HOSPITAL_BASED_OUTPATIENT_CLINIC_OR_DEPARTMENT_OTHER)

## 2024-06-29 VITALS — BP 112/66 | HR 87 | Temp 98.5°F | Resp 18 | Wt 120.0 lb

## 2024-06-29 DIAGNOSIS — R630 Anorexia: Secondary | ICD-10-CM

## 2024-06-29 DIAGNOSIS — Z515 Encounter for palliative care: Secondary | ICD-10-CM | POA: Diagnosis not present

## 2024-06-29 DIAGNOSIS — C679 Malignant neoplasm of bladder, unspecified: Secondary | ICD-10-CM | POA: Diagnosis present

## 2024-06-29 DIAGNOSIS — I5022 Chronic systolic (congestive) heart failure: Secondary | ICD-10-CM | POA: Diagnosis not present

## 2024-06-29 DIAGNOSIS — D638 Anemia in other chronic diseases classified elsewhere: Secondary | ICD-10-CM | POA: Insufficient documentation

## 2024-06-29 DIAGNOSIS — M62838 Other muscle spasm: Secondary | ICD-10-CM

## 2024-06-29 DIAGNOSIS — Z5112 Encounter for antineoplastic immunotherapy: Secondary | ICD-10-CM | POA: Diagnosis present

## 2024-06-29 DIAGNOSIS — E876 Hypokalemia: Secondary | ICD-10-CM | POA: Insufficient documentation

## 2024-06-29 DIAGNOSIS — G893 Neoplasm related pain (acute) (chronic): Secondary | ICD-10-CM

## 2024-06-29 DIAGNOSIS — I9589 Other hypotension: Secondary | ICD-10-CM | POA: Insufficient documentation

## 2024-06-29 DIAGNOSIS — C7951 Secondary malignant neoplasm of bone: Secondary | ICD-10-CM | POA: Diagnosis not present

## 2024-06-29 DIAGNOSIS — M810 Age-related osteoporosis without current pathological fracture: Secondary | ICD-10-CM | POA: Insufficient documentation

## 2024-06-29 DIAGNOSIS — E861 Hypovolemia: Secondary | ICD-10-CM

## 2024-06-29 DIAGNOSIS — Z79891 Long term (current) use of opiate analgesic: Secondary | ICD-10-CM | POA: Diagnosis not present

## 2024-06-29 DIAGNOSIS — R634 Abnormal weight loss: Secondary | ICD-10-CM | POA: Diagnosis not present

## 2024-06-29 DIAGNOSIS — Z79899 Other long term (current) drug therapy: Secondary | ICD-10-CM | POA: Diagnosis not present

## 2024-06-29 DIAGNOSIS — R530 Neoplastic (malignant) related fatigue: Secondary | ICD-10-CM | POA: Diagnosis not present

## 2024-06-29 DIAGNOSIS — Z87891 Personal history of nicotine dependence: Secondary | ICD-10-CM | POA: Insufficient documentation

## 2024-06-29 DIAGNOSIS — Z79620 Long term (current) use of immunosuppressive biologic: Secondary | ICD-10-CM | POA: Diagnosis not present

## 2024-06-29 DIAGNOSIS — R197 Diarrhea, unspecified: Secondary | ICD-10-CM

## 2024-06-29 LAB — CMP (CANCER CENTER ONLY)
ALT: 5 U/L (ref 0–44)
AST: 10 U/L — ABNORMAL LOW (ref 15–41)
Albumin: 3.4 g/dL — ABNORMAL LOW (ref 3.5–5.0)
Alkaline Phosphatase: 54 U/L (ref 38–126)
Anion gap: 10 (ref 5–15)
BUN: 29 mg/dL — ABNORMAL HIGH (ref 8–23)
CO2: 26 mmol/L (ref 22–32)
Calcium: 8.3 mg/dL — ABNORMAL LOW (ref 8.9–10.3)
Chloride: 106 mmol/L (ref 98–111)
Creatinine: 1.89 mg/dL — ABNORMAL HIGH (ref 0.44–1.00)
GFR, Estimated: 28 mL/min — ABNORMAL LOW (ref >60)
Glucose, Bld: 92 mg/dL (ref 70–99)
Potassium: 2.9 mmol/L — ABNORMAL LOW (ref 3.5–5.1)
Sodium: 142 mmol/L (ref 135–145)
Total Bilirubin: 0.4 mg/dL (ref 0.0–1.2)
Total Protein: 6.3 g/dL — ABNORMAL LOW (ref 6.5–8.1)

## 2024-06-29 LAB — CBC WITH DIFFERENTIAL (CANCER CENTER ONLY)
Abs Immature Granulocytes: 0.06 K/uL (ref 0.00–0.07)
Basophils Absolute: 0.1 K/uL (ref 0.0–0.1)
Basophils Relative: 2 %
Eosinophils Absolute: 1.2 K/uL — ABNORMAL HIGH (ref 0.0–0.5)
Eosinophils Relative: 18 %
HCT: 27.6 % — ABNORMAL LOW (ref 36.0–46.0)
Hemoglobin: 9.4 g/dL — ABNORMAL LOW (ref 12.0–15.0)
Immature Granulocytes: 1 %
Lymphocytes Relative: 20 %
Lymphs Abs: 1.3 K/uL (ref 0.7–4.0)
MCH: 30.1 pg (ref 26.0–34.0)
MCHC: 34.1 g/dL (ref 30.0–36.0)
MCV: 88.5 fL (ref 80.0–100.0)
Monocytes Absolute: 0.8 K/uL (ref 0.1–1.0)
Monocytes Relative: 12 %
Neutro Abs: 3.1 K/uL (ref 1.7–7.7)
Neutrophils Relative %: 47 %
Platelet Count: 321 K/uL (ref 150–400)
RBC: 3.12 MIL/uL — ABNORMAL LOW (ref 3.87–5.11)
RDW: 17.7 % — ABNORMAL HIGH (ref 11.5–15.5)
WBC Count: 6.6 K/uL (ref 4.0–10.5)
nRBC: 0 % (ref 0.0–0.2)

## 2024-06-29 LAB — T4, FREE: Free T4: 0.85 ng/dL (ref 0.61–1.12)

## 2024-06-29 MED ORDER — SODIUM CHLORIDE 0.9 % IV SOLN: Freq: Once | INTRAVENOUS | Status: DC

## 2024-06-29 MED ORDER — DIPHENOXYLATE-ATROPINE 2.5-0.025 MG PO TABS
1.0000 | ORAL_TABLET | Freq: Four times a day (QID) | ORAL | 0 refills | Status: DC | PRN
  Filled 2024-06-29: qty 60, 15d supply, fill #0

## 2024-06-29 MED ORDER — SODIUM CHLORIDE 0.9 % IV SOLN: INTRAVENOUS | Status: DC

## 2024-06-29 MED ORDER — SODIUM CHLORIDE 0.9 % IV SOLN
200.0000 mg | Freq: Once | INTRAVENOUS | Status: AC
  Administered 2024-06-29: 200 mg via INTRAVENOUS
  Filled 2024-06-29: qty 200

## 2024-06-29 MED ORDER — POTASSIUM CHLORIDE CRYS ER 20 MEQ PO TBCR
20.0000 meq | EXTENDED_RELEASE_TABLET | Freq: Once | ORAL | Status: DC
  Administered 2024-06-29: 20 meq via ORAL

## 2024-06-29 MED ORDER — POTASSIUM CHLORIDE CRYS ER 10 MEQ PO TBCR
EXTENDED_RELEASE_TABLET | ORAL | 0 refills | Status: DC
  Filled 2024-06-29: qty 90, 84d supply, fill #0

## 2024-06-29 MED ORDER — SODIUM CHLORIDE 0.9 % IV SOLN
1.0000 mg/kg | Freq: Once | INTRAVENOUS | Status: AC
  Administered 2024-06-29: 60 mg via INTRAVENOUS
  Filled 2024-06-29: qty 6

## 2024-06-29 MED ORDER — OXYCODONE HCL 20 MG PO TABS
20.0000 mg | ORAL_TABLET | ORAL | 0 refills | Status: DC | PRN
  Filled 2024-07-04 (×2): qty 90, 15d supply, fill #0

## 2024-06-29 MED ORDER — PROCHLORPERAZINE MALEATE 10 MG PO TABS
10.0000 mg | ORAL_TABLET | Freq: Once | ORAL | Status: AC
  Administered 2024-06-29: 10 mg via ORAL
  Filled 2024-06-29: qty 1

## 2024-06-29 NOTE — Assessment & Plan Note (Addendum)
 500 mL IV fluid today.

## 2024-06-29 NOTE — Assessment & Plan Note (Addendum)
 Continue with reduced dose of EV to 1mg /kg Will try to continue day 8 as tolerated - She reports doing very well with only intermittent neuropathy in the feet.   Seeing palliative care as well for chronic pain management PET tomorrow.  See me next week to discuss results Follow up as scheduled next week.

## 2024-06-29 NOTE — Progress Notes (Signed)
 Palliative Medicine [facility]  [phone] [phone]   Name: [name] Date: 06/29/2024 MRN: [id number]  DOB: Jan 25, 1956  Patient Care Team: Teressa Harrie HERO, FNP as PCP - General (Family Medicine) Inocencio Soyla Lunger, MD as PCP - Electrophysiology (Cardiology) Bernie Lamar PARAS, MD as PCP - Cardiology (Cardiology) Mavis Purchase, MD as Consulting Physician (Neurosurgery) Bernie Lamar PARAS, MD as Consulting Physician (Cardiology) Sheldon Standing, MD as Consulting Physician (General Surgery) Skeet Juliene SAUNDERS, DO as Consulting Physician (Neurology) Delores Lauraine NOVAK, [facility] (Inactive) as Pharmacist (Pharmacist) Saintclair Jasper, MD as Consulting Physician (Gastroenterology) Zehr, Harlene BIRCH, PA-C as Physician Assistant (Gastroenterology) Dennise Hoes, MD as Consulting Physician (Nephrology) Pickenpack-Cousar, Fannie SAILOR, NP as Nurse Practitioner (Hospice and Palliative Medicine)   INTERVAL HISTORY: [name] is a 68 y.o. female with oncologic medical history including metastatic bladder cancer with T5 bone metastasis, COPD, chronic back pain from DDD, osteoporosis with fractures, chronic pain management at [facility] medical.  Palliative is seeing patient for symptom management and goals of care.   SOCIAL HISTORY:     reports that she quit smoking about 4 years ago. Her smoking use included cigarettes. She started smoking about 34 years ago. She has a 15 pack-year smoking history. She has never used smokeless tobacco. She reports current alcohol use of about 2.0 standard drinks of alcohol per week. She reports that she does not use drugs.  ADVANCE DIRECTIVES:  None on file   CODE STATUS: Full code  PAST MEDICAL HISTORY: Past Medical History:  Diagnosis Date   Abnormality of gait due to impairment of balance 11/22/2020   Admission for long-term opiate analgesic use 10/24/2019   AKI (acute kidney injury) 11/14/2023   Anemia of chronic disease  06/07/2023   Arthralgia of both hands 04/07/2023   Arthritis of right hip 05/29/2016   Formatting of this note might be different from the original. Added automatically from request for surgery [id number]   Arthritis of right knee 11/27/2016   At risk for side effect of medication 12/18/2023   Bilateral lower extremity edema 06/07/2023   BMI 26.0-26.9,adult 03/29/2020   Bone lesion 11/18/2023   Cardiomyopathy (HCC)    Overview:  Ejection fraction 45% in 2015 Ejection fraction 30 to 35% in November 2018   Chronic active hepatitis (HCC) 01/04/2023   Chronic back pain    Chronic hypoxemic respiratory failure (HCC) 10/24/2019   Chronic narcotic use 03/23/2015   Chronic pain of both knees 12/21/2018   Added automatically from request for surgery [id number]  Formatting of this note might be different from the original. 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   Dyslipidemia 05/31/2015   Elevated serum glucose 06/23/2023  Fatigue 06/23/2023   Folate deficiency 01/08/2024   GERD (gastroesophageal reflux disease)    History of total hip  replacement 09/25/2016   Hypoaldosteronism 07/13/2020   Hypotension 05/17/2020   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   Ileus following gastrointestinal surgery (HCC) 03/26/2015   Iron deficiency 11/21/2023   Low vitamin B12 level 11/21/2023   Major depressive disorder, single episode, moderate (HCC) 10/24/2019   Malnutrition of moderate degree 10/20/2020   Mesenteric ischemia 10/16/2020   Migraine without aura and without status migrainosus, not intractable 01/04/2023   Mixed hyperlipidemia 05/31/2015   Mixed incontinence 10/20/2020   Moderate protein-calorie malnutrition 11/03/2023   Nausea with vomiting    Nausea without vomiting 01/29/2024   Nephrostomy present (HCC) 12/01/2023   Neuropathy 01/29/2024   Normocytic anemia 06/23/2023   Osteoporosis 10/24/2019   Other spondylosis with radiculopathy, lumbar region 10/24/2019   Paraplegia (HCC) 11/14/2023   Presence of left artificial hip joint 10/24/2019   Primary insomnia 01/04/2023   PTSD (post-traumatic stress disorder)    Renal lesion 11/05/2023   Rheumatoid arthritis involving multiple sites (HCC) 04/07/2023   Senile osteoporosis 10/24/2019   Serosanguineous chronic otitis media of right ear 08/02/2021   Therapeutic opioid induced constipation 01/04/2023   Thyroid  disease    Urge incontinence of urine 11/03/2023   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   Vertebral fracture, pathological 12/01/2023   Vitamin D  deficiency 09/25/2023    ALLERGIES:  is allergic to lyrica  [pregabalin ].  MEDICATIONS:  Current Outpatient Medications  Medication Sig Dispense Refill   ARIPiprazole  (ABILIFY ) 5 MG tablet TAKE 1 TABLET BY MOUTH DAILY 30 tablet 10   aspirin  81 MG tablet Take 81 mg by mouth daily.     BREO ELLIPTA  100-25 MCG/ACT AEPB INHALE ONE (1) PUFF BY MOUTH DAILY 60 each 11   Carboxymethylcell-Glycerin  PF 0.5-0.9 % SOLN Place 2 drops into both eyes 4 (four) times daily. 1 each 11    carvedilol  (COREG ) 3.125 MG tablet TAKE ONE (1) TABLET BY MOUTH TWICE DAILY WITH MEALS (Patient taking differently: Take 3.125 mg by mouth 2 (two) times daily with a meal.) 60 tablet 10   denosumab  (PROLIA ) 60 MG/ML SOSY injection Inject 60 mg into the skin every 6 (six) months. 180 mL 2   dextromethorphan  15 MG/5ML syrup Take 10 mLs (30 mg total) by mouth 4 (four) times daily as needed for cough. 120 mL 0   diclofenac  Sodium (VOLTAREN ) 1 % GEL Apply 2 g topically 4 (four) times daily. 50 g 4   diphenoxylate -atropine  (LOMOTIL ) 2.5-0.025 MG tablet Take 1 tablet by mouth 4 (four) times daily as needed for diarrhea or loose stools. 60 tablet 0   dronabinol  (MARINOL ) 5 MG capsule Take 1 capsule (5 mg total) by mouth 2 (two) times daily before lunch and supper. 60 capsule 0   ENTRESTO  24-26 MG TAKE ONE (1) TABLET BY MOUTH TWICE DAILY 60 tablet 10   EPINEPHRINE  0.3 mg/0.3 mL IJ SOAJ injection Inject 0.3 mLs (0.3 mg total) into the muscle as needed for anaphylaxis. 1 each 2   esomeprazole  (NEXIUM ) 20 MG capsule TAKE ONE (1) CAPSULE BY MOUTH ONCE DAILY 90 capsule 3   FARXIGA 5 MG TABS tablet Take 5 mg by mouth daily.     ferrous sulfate  325 (65 FE) MG tablet Take 1 tablet (325 mg total) by mouth at bedtime.     fluticasone  (FLONASE ) 50 MCG/ACT nasal spray USE 2 SPRAYS IN EACH NOSTRILS DAILY AS NEEDED (Patient taking differently: Place  2 sprays into both nostrils daily as needed for allergies or rhinitis.) 48 g 0   folic acid  (FOLVITE ) 1 MG tablet Take 1 tablet (1 mg total) by mouth daily.     furosemide  (LASIX ) 20 MG tablet Take 40 mg by mouth daily.     ipratropium-albuterol  (DUONEB) 0.5-2.5 (3) MG/3ML SOLN USE 1 VIAL VIA NEBULIZER EVERY 6 HOURS AS NEEDED FOR SHORTNESS OF BREATH (Patient taking differently: Take 3 mLs by nebulization every 6 (six) hours as needed (SOB).) 90 mL 0   levocetirizine (XYZAL) 5 MG tablet Take 5 mg by mouth daily as needed for allergies.     lidocaine  (LIDODERM ) 5 % Place 1 patch  onto the skin daily. Remove & Discard patch within 12 hours or as directed by MD     lidocaine -prilocaine  (EMLA ) cream Apply to affected area once (Patient taking differently: Apply 1 Application topically once. Apply to affected area once) 30 g 3   lubiprostone  (AMITIZA ) 24 MCG capsule TAKE 1 CAPSULE(24 MCG) BY MOUTH TWICE DAILY WITH A MEAL (Patient taking differently: Take 24 mcg by mouth 2 (two) times daily with a meal.) 180 capsule 1   methadone  (DOLOPHINE ) 10 MG tablet Take 1 tablet (10 mg total) by mouth every 8 (eight) hours. 75 tablet 0   methylPREDNISolone  (MEDROL  DOSEPAK) 4 MG TBPK tablet If significant rash, will start at 6 tabs on day 1, then 5 tabs on day 2, then 4 tabs on day 3, then 3 tabs on day 4, then 2 tabs on day 5 and then 1 tab on day 6. Take with food. 21 tablet 0   metoCLOPramide  (REGLAN ) 10 MG tablet Take 1 tablet (10 mg total) by mouth every 8 (eight) hours as needed for nausea. 45 tablet 1   Multiple Vitamin (MULTIVITAMIN WITH MINERALS) TABS tablet Take 1 tablet by mouth daily.     naloxone  (NARCAN ) nasal spray 4 mg/0.1 mL Place 1 spray into the nose once.     nitroGLYCERIN  (NITROSTAT ) 0.4 MG SL tablet Place 1 tablet (0.4 mg total) under the tongue every 5 (five) minutes as needed for chest pain. 25 tablet 6   ondansetron  (ZOFRAN ) 4 MG tablet Take 1 tablet (4 mg total) by mouth every 8 (eight) hours as needed for nausea or vomiting. 20 tablet 0   ondansetron  (ZOFRAN ) 8 MG tablet Take 1 tablet (8 mg total) by mouth every 8 (eight) hours as needed for nausea or vomiting. 30 tablet 1   Oxycodone  HCl 20 MG TABS Take 1 tablet (20 mg total) by mouth every 4 (four) hours as needed. 90 tablet 0   potassium chloride  (KLOR-CON  M) 10 MEQ tablet Take 2 tablets by mouth twice daily for 3 days, then 1 tablet once daily 90 tablet 0   prochlorperazine  (COMPAZINE ) 10 MG tablet TAKE 1 TABLET(10 MG) BY MOUTH EVERY 6 HOURS AS NEEDED FOR NAUSEA OR VOMITING 30 tablet 3   ranolazine  (RANEXA ) 500 MG  12 hr tablet TAKE 1 TABLET BY MOUTH TWICE DAILY 60 tablet 10   rosuvastatin  (CRESTOR ) 20 MG tablet TAKE 1 TABLET BY MOUTH ONCE DAILY 90 tablet 1   sertraline  (ZOLOFT ) 100 MG tablet TAKE 1/2 TABLET(50 MG) BY MOUTH DAILY (Patient taking differently: Take 50 mg by mouth daily.) 45 tablet 1   sulfamethoxazole -trimethoprim  (BACTRIM  DS) 800-160 MG tablet Take 1 tablet by mouth 2 (two) times daily. 14 tablet 0   tiZANidine  (ZANAFLEX ) 4 MG tablet Take 1 tablet (4 mg total) by mouth at bedtime. 30  tablet 3   topiramate  (TOPAMAX ) 50 MG tablet TAKE TWO (2) TABLETS BY MOUTH EVERY DAY AT BEDTIME (Patient taking differently: Take 50 mg by mouth at bedtime. TAKE TWO (2) TABLETS BY MOUTH EVERY DAY AT BEDTIME) 60 tablet 10   Ubrogepant  (UBRELVY ) 100 MG TABS Take 1 tablet (100 mg total) by mouth as needed (May repeat after 2 hours.  Maximum 2 tablets in 24 hours). TAKE 1 TABLET BY MOUTH BY MOUTH AS NEEDED( MAY REPEAT 1 TABLET AFTER 2 HOURS IF NEEDED, MAXIMUM 2 TABLETS IN 24 HOURS) Strength: 100 mg 48 tablet 1   Vitamin D , Ergocalciferol , (DRISDOL ) 1.25 MG (50000 UNIT) CAPS capsule TAKE 1 CAPSULE BY MOUTH EVERY 7 DAYS FOR 12 DOSES (Patient taking differently: Take 50,000 Units by mouth every 7 (seven) days. Patient usually takes this medication on mondays) 12 capsule 0   No current facility-administered medications for this visit.    VITAL SIGNS: There were no vitals taken for this visit. There were no vitals filed for this visit.  Estimated body mass index is 25.08 kg/m as calculated from the following:   Height as of 05/25/24: 4' 10 (1.473 m).   Weight as of an earlier encounter on 06/29/24: 120 lb (54.4 kg).  Assessment NAD, sitting comfortably in recliner  Normal breathing pattern RRR AAO x3  PERFORMANCE STATUS (ECOG) : 3 - Symptomatic, >50% confined to bed  IMPRESSION: Discussed the use of AI scribe software for clinical note transcription with the patient, who gave verbal consent to  proceed.  History of Present Illness [name] is a 68 year old female with chronic pain and metastatic bladder cancer with T5 bone involvement who was seen during her infusion for symptom management follow-up. She is accompanied by her daughter. Denies concerns with uncontrolled nausea, vomiting, or constipation. Her appetite has decreased some. Fluctuated with some days being better than others. Her weight is down to 120lbs from 125lbs. She also attributes loose stools to changes in appetite and weight. She is taking Lomotil  as needed which is controlling stools.   Patient is scheduled for scans tomorrow. Expresses understanding of gaining better understanding of cancer state once resulted.   She has a catheter that is changed monthly, with the last change occurring two weeks ago. She notes a 'foul smell' associated with her urine, which has also appeared darker and 'with pus'. . Per Dr. Tina we will get a culture to further evaluate.   We discussed Ms. Popovich's pain at length. She experiences variable pain levels, describing her days as having 'good and bad days'. She reports pain is overall controlled on current regimen which includes methadone  10mg  every 8 hours (last picked up on 10/20), oxycodone  20mg  as needed for severe and breakthrough pain (picked up on 10/27), and Tizanidine  as needed for spasms. No adjustments to current regimen.   We will continue to closely follow and support. All questions answered.    I discussed the importance of continued conversation with family and their medical providers regarding overall plan of care and treatment options, ensuring decisions are within the context of the patients values and GOCs. Assessment & Plan Chronic indwelling urinary catheter with urinary symptoms and possible infection Chronic indwelling urinary catheter with recent change on October 20th. Reports of false smell suggest possible infection. - Obtained urine culture to assess for  infection per Dr. Tina.   Chronic pain on medication Chronic pain managed with medication. Reports of good and bad days. Concerns about running low on  medication. Overall pain is managed. No adjustments.  - Check medication supply at home and message provider with pill count.  - Continue with oxycodone  to 20 mg every 4 hours as needed. - Continue Methadone  10mg  every 8 hours.   Poor appetite and weight loss Poor appetite with associated weight loss. No nausea or vomiting reported.  Chronic diarrhea Frequent use of bowel medication. No nausea reported. - Continue Lomotil  as needed  Follow-up I will plan to see patient back in 4-6 weeks.   Patient expressed understanding and was in agreement with this plan. She also understands that She can call the clinic at any time with any questions, concerns, or complaints.   Any controlled substances utilized were prescribed in the context of palliative care. PDMP has been reviewed.   Visit consisted of counseling and education dealing with the complex and emotionally intense issues of symptom management and palliative care in the setting of serious and potentially life-threatening illness.  Levon Borer, AGPCNP-BC  Palliative Medicine Team/[facility]

## 2024-06-29 NOTE — Assessment & Plan Note (Addendum)
 If interval response, will modify treatment plan.

## 2024-06-29 NOTE — Progress Notes (Signed)
 Unable to get urine culture from the foley due to access with the syringe. Dr. Tina notified

## 2024-06-29 NOTE — Addendum Note (Signed)
 Addended by: Ahmed Inniss K on: 06/29/2024 05:31 PM   Modules accepted: Orders

## 2024-06-29 NOTE — Assessment & Plan Note (Addendum)
 Continue furosemide  20 mg with potassium 20 mEq daily Ok to take one extra furosemide  20 mg with 20 of potassium if needed for worsening edema.

## 2024-06-29 NOTE — Progress Notes (Signed)
 Patient's BP low in the 80s, patient denies any distress or dizziness. Dr. Tina notified, ordered IVF 500cc and then chemo treatment after bolus.

## 2024-06-29 NOTE — Assessment & Plan Note (Addendum)
 Give 20 mEq of potassium infusion today We will take 20 mEq of potassium chloride  twice daily for 3 days, then once daily Normally on Lasix  20 mg daily.  If she takes one extra Lasix  20 mg dosage, advised patient to take extra 20 mEq of potassium afterwards.

## 2024-06-29 NOTE — Progress Notes (Signed)
 Crystal Crystal OFFICE PROGRESS NOTE  Patient Care Team: Teressa Harrie HERO, FNP as PCP - General (Family Medicine) Inocencio Soyla Lunger, [name] as PCP - Electrophysiology (Cardiology) Bernie Lamar PARAS, [name] as PCP - Cardiology (Cardiology) Mavis Purchase, [name] as Consulting Physician (Neurosurgery) Bernie Lamar PARAS, [name] as Consulting Physician (Cardiology) Sheldon Standing, [name] as Consulting Physician (General Surgery) Skeet Juliene SAUNDERS, DO as Consulting Physician (Neurology) Delores Lauraine NOVAK, [facility] (Inactive) as Pharmacist (Pharmacist) Saintclair Jasper, [name] as Consulting Physician (Gastroenterology) Zehr, Harlene BIRCH, PA-C as Physician Assistant (Gastroenterology) Dennise Hoes, [name] as Consulting Physician (Nephrology) Pickenpack-Cousar, Fannie SAILOR, NP as Nurse Practitioner (Hospice and Palliative Medicine)  68 y.o.female with past medical history of COPD, chronic back from from DDD, osteoporosis with fractures presenting with back pain radiating down to the hip in March of 2025 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  Cystoscopy with TURBT show right sided bladder tumor with palpable anterior vaginal mass.  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    Current diagnosis: Stage IVB UC with bone metastases Treatment:  12/18/23 C1D1 EV/P NGS: neg for HER2 IHC, FGFR3. pMMR. TP53, KMT2D, NOTCH1, KMT2D, FAT1. TMP 7.9.   Initially hypotension.  500 mL IV fluid given.  BP improved.  Patient is asymptomatic.  Neuropathy is not persistent and tolerable.  No new side effects.  Proceed with treatment today. Assessment & Plan Urothelial carcinoma of bladder (HCC) Continue with reduced dose of EV to 1mg /kg Will try to continue day 8 as tolerated - She reports doing very well with only intermittent neuropathy in the feet.   Seeing palliative care as well for chronic pain management PET tomorrow.  See me next week to discuss results Follow  up as scheduled next week. Hypokalemia Give 20 mEq of potassium infusion today We will take 20 mEq of potassium chloride  twice daily for 3 days, then once daily Normally on Lasix  20 mg daily.  If she takes one extra Lasix  20 mg dosage, advised patient to take extra 20 mEq of potassium afterwards. Chronic systolic congestive heart failure, NYHA class 2 (HCC) Continue furosemide  20 mg with potassium 20 mEq daily Ok to take one extra furosemide  20 mg with 20 of potassium if needed for worsening edema. Decreased appetite If interval response, will modify treatment plan. Hypotension due to hypovolemia 500 mL IV fluid today.   Follow-up next week as scheduled.   Crystal Crystal  INTERVAL HISTORY: Patient returns for follow-up.  Clinically feeling well to get treatment.  No fever, chills or rigors.  Reports some foul-smelling urine but no burning on urination.  Catheter was changed 2 weeks ago.  No bleeding.  She is trying to drink more fluid.  Appetite is not great.  Energy is pretty good.  Able to walk better compared to before.  She does not require as much help compared to the beginning.  Neuropathy is minimal, intermittently over the feet.  No coughing or new shortness of breath.  No other new symptoms.  Oncology History  Urothelial carcinoma of bladder (HCC)  11/18/2023 Initial Diagnosis   Urothelial carcinoma of bladder (HCC)   12/01/2023 Cancer Staging   Staging form: Urinary Bladder, AJCC 8th Edition - Clinical: Stage IVB (cTX, cNX, pM1b) - Signed by Crystal Crystal on 12/01/2023 WHO/ISUP grade (low/high): High Grade Histologic grading system: 2 grade system   12/18/2023 -  Chemotherapy   12/18/23 C1D1 01/08/24 C2D1 01/29/24 C3D1. Pembro only. Day  8 reduced EV to 1mg /kg Patient is on Treatment Plan : UROTHELIAL ADVANCED, METASTATIC ENFORTUMAB D1, D8 + PEMBROLIZUMAB  (200) D1 Q21D      02/12/2024 PET scan   PET 1. Response to therapy of osseous metastasis. 2. The pelvis is again  poorly evaluated. Soft tissue density within the deep pelvis is less distinct today with decreased hypermetabolism. Potential etiologies again include response to therapy of a synchronous uterine primary versus isolated metastasis (presuming the patient is status post hysterectomy). 3. Placement of a right ureteric stent with similar mild right-sided hydronephrosis. The left-sided hydroureteronephrosis has resolved. 4.  Aortic Atherosclerosis (ICD10-I70.0).      PHYSICAL EXAMINATION: ECOG PERFORMANCE STATUS: 2 - Symptomatic, <50% confined to bed  Vital signs stable  GENERAL: alert, no distress and comfortable SKIN: skin color pale and no jaundice  LUNGS: clear to auscultation and no wheeze or rales with normal breathing effort HEART: regular rate & rhythm  ABDOMEN: abdomen soft, non-tender and nondistended. Musculoskeletal: Bilateral pitting edema  Relevant data reviewed during this visit included labs.

## 2024-06-29 NOTE — Progress Notes (Signed)
 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 She is receiving palliative padcev  and keytruda . Patient followed by Dr Tina.   Met with patient and daughter in infusion. She reports ongoing poor appetite. Eating one decent meal per daughter, usually around 6-7 PM. She is drinking one Ensure Original. Patient found complete to thick and very filling. She only likes chocolate flavor. Denies diarrhea, constipation, nausea, vomiting.    Medications: reviewed   Labs: K 2.9, BUN 29, Cr 1.89, Albumin  3.4  Anthropometrics: Wt 120 lb today    NUTRITION DIAGNOSIS: Unintentional wt loss ongoing - drinking daily ONS   INTERVENTION:  Educated on strategies for poor appetite, recommend eating by the clock - po within first hour of waking followed by bites/sips q2h - handout with tips provided Continue eating dinner meal - take advantage when appetite is good Continue daily Ensure, provided samples of Ensure Plus HP + CIB powder for pt to try - would favor high calorie high protein shakes if tolerated  Support and encouragement    MONITORING, EVALUATION, GOAL: wt trends, intake    NEXT VISIT: To be scheduled

## 2024-06-30 ENCOUNTER — Other Ambulatory Visit (HOSPITAL_COMMUNITY): Payer: Self-pay

## 2024-06-30 ENCOUNTER — Encounter (HOSPITAL_COMMUNITY): Admission: RE | Admit: 2024-06-30 | Source: Ambulatory Visit

## 2024-07-01 ENCOUNTER — Other Ambulatory Visit: Admitting: Nurse Practitioner

## 2024-07-01 ENCOUNTER — Other Ambulatory Visit (HOSPITAL_COMMUNITY): Payer: Self-pay

## 2024-07-01 ENCOUNTER — Encounter (HOSPITAL_COMMUNITY)
Admission: RE | Admit: 2024-07-01 | Discharge: 2024-07-01 | Disposition: A | Discharge status: Home / Self Care | Source: Ambulatory Visit

## 2024-07-01 DIAGNOSIS — C679 Malignant neoplasm of bladder, unspecified: Secondary | ICD-10-CM | POA: Diagnosis present

## 2024-07-01 DIAGNOSIS — Z515 Encounter for palliative care: Secondary | ICD-10-CM

## 2024-07-01 DIAGNOSIS — C7951 Secondary malignant neoplasm of bone: Secondary | ICD-10-CM

## 2024-07-01 DIAGNOSIS — G893 Neoplasm related pain (acute) (chronic): Secondary | ICD-10-CM | POA: Diagnosis not present

## 2024-07-01 LAB — GLUCOSE, CAPILLARY: Glucose-Capillary: 98 mg/dL (ref 70–99)

## 2024-07-01 MED ORDER — FLUDEOXYGLUCOSE F - 18 (FDG) INJECTION
6.0000 | Freq: Once | INTRAVENOUS | Status: AC | PRN
  Administered 2024-07-01: 6.31 via INTRAVENOUS

## 2024-07-01 MED ORDER — HEPARIN SOD (PORK) LOCK FLUSH 100 UNIT/ML IV SOLN
500.0000 [IU] | Freq: Once | INTRAVENOUS | Status: AC
  Administered 2024-07-01: 500 [IU] via INTRAVENOUS
  Filled 2024-07-01: qty 5

## 2024-07-01 MED ORDER — METHADONE HCL 5 MG PO TABS
5.0000 mg | ORAL_TABLET | Freq: Three times a day (TID) | ORAL | 0 refills | Status: DC
  Filled 2024-07-01: qty 30, 10d supply, fill #0

## 2024-07-01 NOTE — Telephone Encounter (Signed)
 Myself and Maygan, RN met with patient's daughter to discuss pain management. Unfortunately patient was taking additional pain pills due to bad days which is attributed to weather and just not feeling well. She and daughter have requested refill for oxycodone  however this has been declined as patient picked up a 15-day supply on October 27.  Extensive education provided on pain contract and protocol to include safe patient administration.  Patient and daughter have been educated extensively on self-medicating and making adjustments to controlled substance without medical supervision.  I also reviewed current pain contract which has been signed by patient verbalizing her understanding of protocol that was sent in place during initial visit in terms of medication use and refill policy.  Daughter shares concerns that her mother's pain has increased.  We discussed patient is scheduled for PET scan today and with pending results we will further discuss if any changes and adjustments need to be made to her current regimen.  Patient is complaining of increased pain that remains persistent and uncontrolled by current regimen.  Education has been provided on the differences and extended release medications versus immediate release.  I have offered that it is time to make adjustments to her methadone  despite patient stating she cannot tell any difference in use.  They are agreeable to make adjustments to the methadone  which will be increased from 10 mg to 15 mg every 8 hours.  Patient was previously seen by [facility] pain center prior to initiating services here with our palliative team I did discuss extensively with patient's daughter who would be willing to discuss with physician team there and allow for patient to be referred back to their facility as she was previously on Bel[place]uca  however this is not a medication that we are comfortable prescribing at her practice due to close monitoring and efficacy.  At this  time patient and daughter would not want to transfer services at this time.  We did discuss that she continues to have uncontrolled pain this may be most beneficial as at some point we may reach limitations.  I have also reminded family of pain contract and protocol including terminating palliative support specifically pain management in the event of self-medicating and adjustment of medications without medical supervision and guidance as this is against the law as well as to assure safety of patient.  Education provided on potential complications including respiratory distress and sudden death due to overmedication with opioid use.  Daughter shares recent conversations with home health nurse who advised of outpatient palliative support to obtain additional home health and medication management.  Advised patient can have outpatient palliative support in the community as this can be an extra layer support however there program does not manage pain and she would further require a pain management clinic.  All questions answered and support provided.  Any controlled substances utilized were prescribed in the context of palliative care. PDMP has been reviewed.    I provided 45 minutes of non face-to-face telephone visit time during this encounter, and > 50% was spent counseling as documented under my assessment & plan. Visit consisted of counseling and education dealing with the complex and emotionally intense issues of symptom management and palliative care in the setting of serious and potentially life-threatening illness.  Levon Borer, AGPCNP-BC  Palliative Medicine Team/[facility]

## 2024-07-04 ENCOUNTER — Other Ambulatory Visit (HOSPITAL_COMMUNITY): Payer: Self-pay

## 2024-07-06 ENCOUNTER — Inpatient Hospital Stay

## 2024-07-06 ENCOUNTER — Telehealth: Payer: Self-pay

## 2024-07-06 DIAGNOSIS — R1900 Intra-abdominal and pelvic swelling, mass and lump, unspecified site: Secondary | ICD-10-CM

## 2024-07-06 NOTE — Telephone Encounter (Signed)
 Spoke with the daughter states she has Covid which is why the patient did not come as she is the driver. Patient told the daughter that she had called but I received no message. Daughter would like to reschedule for this week . Message sent to scheduling to do this for me. Arland Legions BSN RN

## 2024-07-06 NOTE — Telephone Encounter (Signed)
 Patient could not make it to appointment today.  Called and spoke to daughter, she is a COVID and does not want to expose her mother.  She is otherwise feeling well.  Let her know PET scan showed interval response from treatment of bladder cancer.  Questionable pelvic mass or uterine mass on PET scan.  Relocated with radiology and recommend pelvic ultrasound.  Orders placed.  She understands.  We will resume treatment when she is better.  Next treatment will be 11/24.  We will proceed with once every 3 weeks to avoid neuropathy.  Day 8 deleted.

## 2024-07-07 ENCOUNTER — Encounter: Payer: Self-pay | Admitting: Cardiology

## 2024-07-07 ENCOUNTER — Ambulatory Visit: Attending: Cardiology | Admitting: Cardiology

## 2024-07-07 VITALS — BP 90/58 | HR 107 | Ht 60.0 in | Wt 120.0 lb

## 2024-07-07 DIAGNOSIS — C679 Malignant neoplasm of bladder, unspecified: Secondary | ICD-10-CM

## 2024-07-07 DIAGNOSIS — I420 Dilated cardiomyopathy: Secondary | ICD-10-CM

## 2024-07-07 DIAGNOSIS — Z95810 Presence of automatic (implantable) cardiac defibrillator: Secondary | ICD-10-CM

## 2024-07-07 DIAGNOSIS — I2510 Atherosclerotic heart disease of native coronary artery without angina pectoris: Secondary | ICD-10-CM

## 2024-07-07 DIAGNOSIS — J441 Chronic obstructive pulmonary disease with (acute) exacerbation: Secondary | ICD-10-CM | POA: Diagnosis not present

## 2024-07-07 DIAGNOSIS — E785 Hyperlipidemia, unspecified: Secondary | ICD-10-CM

## 2024-07-07 DIAGNOSIS — I5022 Chronic systolic (congestive) heart failure: Secondary | ICD-10-CM

## 2024-07-07 MED ORDER — FUROSEMIDE 20 MG PO TABS: 60.0000 mg | ORAL_TABLET | Freq: Every day | ORAL | 1 refills | Status: DC

## 2024-07-07 NOTE — Progress Notes (Signed)
 Cardiology Office Note:    Date:  07/07/2024   ID:  [name], DOB 03-01-1956, MRN [id number]  PCP:  Teressa Harrie HERO, FNP  Cardiologist:  Lamar Fitch, MD    Referring MD: Sherre Clapper, MD   Chief Complaint  Patient presents with   Follow-up    History of Present Illness:    [name] is a 68 y.o. female complex past medical history significant for nonischemic cardiomyopathy initial ejection fraction 35% last echocardiogram showed normal 11 to ejection fraction, moderate mitral valve regurgitation, ICD which is BiV, essential hypertension, fibromyalgia, she was find to have bladder cancer, now she is in palliative care.  She denies have any chest pain tightness squeezing pressure burning chest she complained of having swollen legs she also complained about the fact she have difficulty sleeping at night she will use 1 big pillow.  There is no shortness of breath at night.  She does have indwelling urinary catheter.  Past Medical History:  Diagnosis Date   Abnormality of gait due to impairment of balance 11/22/2020   Admission for long-term opiate analgesic use 10/24/2019   AKI (acute kidney injury) 11/14/2023   Anemia of chronic disease 06/07/2023   Arthralgia of both hands 04/07/2023   Arthritis of right hip 05/29/2016   Formatting of this note might be different from the original. Added automatically from request for surgery [id number]   Arthritis of right knee 11/27/2016   At risk for side effect of medication 12/18/2023   Bilateral lower extremity edema 06/07/2023   BMI 26.0-26.9,adult 03/29/2020   Bone lesion 11/18/2023   Cardiomyopathy (HCC)    Overview:  Ejection fraction 45% in 2015 Ejection fraction 30 to 35% in November 2018   Chronic active hepatitis (HCC) 01/04/2023   Chronic back pain    Chronic hypoxemic respiratory failure (HCC) 10/24/2019   Chronic narcotic use 03/23/2015   Chronic pain of both knees 12/21/2018   Added automatically from request for  surgery [id number]  Formatting of this note might be different from the original. 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   Dyslipidemia 05/31/2015   Elevated serum glucose 06/23/2023   Fatigue 06/23/2023   Folate deficiency 01/08/2024   GERD (gastroesophageal reflux disease)    History of total hip replacement 09/25/2016   Hypoaldosteronism 07/13/2020   Hypotension 05/17/2020   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   Ileus following gastrointestinal surgery (HCC) 03/26/2015   Iron deficiency 11/21/2023   Low vitamin B12 level 11/21/2023   Major depressive disorder, single episode, moderate (HCC) 10/24/2019   Malnutrition of moderate degree 10/20/2020   Mesenteric ischemia 10/16/2020   Migraine without aura and without status migrainosus, not intractable 01/04/2023  Mixed hyperlipidemia 05/31/2015   Mixed incontinence 10/20/2020   Moderate protein-calorie malnutrition 11/03/2023   Nausea with vomiting    Nausea without vomiting 01/29/2024   Nephrostomy present (HCC) 12/01/2023   Neuropathy  01/29/2024   Normocytic anemia 06/23/2023   Osteoporosis 10/24/2019   Other spondylosis with radiculopathy, lumbar region 10/24/2019   Paraplegia (HCC) 11/14/2023   Presence of left artificial hip joint 10/24/2019   Primary insomnia 01/04/2023   PTSD (post-traumatic stress disorder)    Renal lesion 11/05/2023   Rheumatoid arthritis involving multiple sites (HCC) 04/07/2023   Senile osteoporosis 10/24/2019   Serosanguineous chronic otitis media of right ear 08/02/2021   Therapeutic opioid induced constipation 01/04/2023   Thyroid  disease    Urge incontinence of urine 11/03/2023   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 Small internal hemrrhoids. Otherwise normal colonoscopy   COLONOSCOPY WITH PROPOFOL  N/A 09/27/2021   Procedure: COLONOSCOPY WITH PROPOFOL ;  Surgeon: Shila Gustav GAILS, MD;  Location: [facility];  Service: Endoscopy;  Laterality: N/A;   CORONARY ANGIOPLASTY     ESOPHAGOGASTRODUODENOSCOPY  02/04/2014   Mild gastritis. Otherwise normal EGD   HAND SURGERY Bilateral    ICD GENERATOR CHANGEOUT N/A 05/21/2019   Procedure: ICD GENERATOR CHANGEOUT;  Surgeon: Inocencio Soyla Lunger, MD;  Location: [facility] INVASIVE CV LAB;  Service: Cardiovascular;  Laterality: N/A;   ICD IMPLANT     Medtronic   intestinal blockage 2011     IR IMAGING GUIDED PORT INSERTION  12/24/2023   IR NEPHRO TUBE REMOV/FL  06/02/2024   IR NEPHROSTOMY PLACEMENT RIGHT  11/16/2023   IR URETERAL STENT PLACEMENT EXISTING ACCESS RIGHT  12/24/2023   LAPAROSCOPIC ASSISTED VENTRAL HERNIA REPAIR N/A 03/23/2015   Procedure: LAPAROSCOPIC VENTRAL WALL  HERNIA REPAIR;  Surgeon: Elspeth Schultze, MD;  Location: [facility];  Service: General;  Laterality: N/A;  With MESH   LAPAROSCOPIC LYSIS OF ADHESIONS N/A 03/23/2015   Procedure: LAPAROSCOPIC LYSIS OF ADHESIONS;  Surgeon: Elspeth Schultze, MD;  Location: [facility];  Service: General;  Laterality: N/A;   LSCS      x2   NASAL SEPTUM SURGERY     NECK SURGERY     fused   TONSILLECTOMY     TRANSURETHRAL RESECTION OF BLADDER TUMOR N/A 11/16/2023   Procedure: TURBT (TRANSURETHRAL RESECTION OF BLADDER TUMOR);  Surgeon: [name] Lonni Righter, MD;  Location: [facility];  Service: Urology;  Laterality: N/A;   TYMPANOSTOMY TUBE PLACEMENT Right 2024   ULNAR NERVE TRANSPOSITION  01/23/2012   Procedure: ULNAR NERVE DECOMPRESSION/TRANSPOSITION;  Surgeon: Reyes JONETTA Budge, MD;  Location: [facility];  Service: Neurosurgery;  Laterality: Left;  LEFT ulnar nerve decompression    Current Medications: Current Meds  Medication Sig   ARIPiprazole  (ABILIFY ) 5 MG tablet TAKE 1 TABLET BY MOUTH DAILY   aspirin  81 MG tablet Take 81 mg by mouth daily.   BREO ELLIPTA  100-25 MCG/ACT AEPB INHALE ONE (1) PUFF BY MOUTH DAILY   carvedilol  (COREG ) 3.125 MG tablet TAKE ONE (1) TABLET BY MOUTH TWICE DAILY WITH MEALS (Patient taking differently: Take 3.125 mg by mouth 2 (  two) times daily with a meal.)   denosumab  (PROLIA ) 60 MG/ML SOSY injection Inject 60 mg into the skin every 6 (six) months.   diphenoxylate -atropine  (LOMOTIL ) 2.5-0.025 MG tablet Take 1 tablet by mouth 4 (four) times daily as needed for diarrhea or loose stools.   ENTRESTO  24-26 MG TAKE ONE (1) TABLET BY MOUTH TWICE DAILY   furosemide  (LASIX ) 20 MG tablet Take 40 mg by mouth daily.   ipratropium-albuterol  (DUONEB) 0.5-2.5 (3) MG/3ML SOLN USE 1 VIAL VIA NEBULIZER EVERY 6 HOURS AS NEEDED FOR SHORTNESS OF BREATH (Patient taking differently: Take 3 mLs by nebulization every 6 (six) hours as needed (SOB).)   levocetirizine (XYZAL) 5 MG tablet Take 5 mg by mouth daily as  needed for allergies.   lubiprostone  (AMITIZA ) 24 MCG capsule TAKE 1 CAPSULE(24 MCG) BY MOUTH TWICE DAILY WITH A MEAL (Patient taking differently: Take 24 mcg by mouth 2 (two) times daily with a meal.)   methadone  (DOLOPHINE ) 10 MG tablet Take 1 tablet (10 mg total) by mouth every 8 (eight) hours.   methadone  (DOLOPHINE ) 5 MG tablet Take 1 tablet (5 mg total) by mouth every 8 (eight) hours. (Take in addition to 10mg  (Total 15mg )).   Multiple Vitamin (MULTIVITAMIN WITH MINERALS) TABS tablet Take 1 tablet by mouth daily.   naloxone  (NARCAN ) nasal spray 4 mg/0.1 mL Place 1 spray into the nose once.   nitroGLYCERIN  (NITROSTAT ) 0.4 MG SL tablet Place 1 tablet (0.4 mg total) under the tongue every 5 (five) minutes as needed for chest pain.   ondansetron  (ZOFRAN ) 4 MG tablet Take 1 tablet (4 mg total) by mouth every 8 (eight) hours as needed for nausea or vomiting.   ondansetron  (ZOFRAN ) 8 MG tablet Take 1 tablet (8 mg total) by mouth every 8 (eight) hours as needed for nausea or vomiting.   Oxycodone  HCl 20 MG TABS Take 1 tablet (20 mg total) by mouth every 4 (four) hours as needed.   potassium chloride  (KLOR-CON  M) 10 MEQ tablet Take 2 tablets by mouth twice daily for 3 days, then 1 tablet once daily   prochlorperazine  (COMPAZINE ) 10 MG tablet TAKE 1 TABLET(10 MG) BY MOUTH EVERY 6 HOURS AS NEEDED FOR NAUSEA OR VOMITING   ranolazine  (RANEXA ) 500 MG 12 hr tablet TAKE 1 TABLET BY MOUTH TWICE DAILY   rosuvastatin  (CRESTOR ) 20 MG tablet TAKE 1 TABLET BY MOUTH ONCE DAILY   sertraline  (ZOLOFT ) 100 MG tablet TAKE 1/2 TABLET(50 MG) BY MOUTH DAILY (Patient taking differently: Take 50 mg by mouth daily.)   sulfamethoxazole -trimethoprim  (BACTRIM  DS) 800-160 MG tablet Take 1 tablet by mouth 2 (two) times daily.   Vitamin D , Ergocalciferol , (DRISDOL ) 1.25 MG (50000 UNIT) CAPS capsule TAKE 1 CAPSULE BY MOUTH EVERY 7 DAYS FOR 12 DOSES (Patient taking differently: Take 50,000 Units by mouth every 7 (seven) days. Patient  usually takes this medication on mondays)     Allergies:   Lyrica  [pregabalin ]   Social History   Socioeconomic History   Marital status: Single    Spouse name: Not on file   Number of children: 3   Years of education: Not on file   Highest education level: 10th grade  Occupational History   Occupation: disabled  Tobacco Use   Smoking status: Former    Current packs/day: 0.00    Average packs/day: 0.5 packs/day for 30.0 years (15.0 ttl pk-yrs)    Types: Cigarettes    Start date: 03/1990    Quit date: 03/2020    Years since  quitting: 4.2   Smokeless tobacco: Never  Vaping Use   Vaping status: Never Used  Substance and Sexual Activity   Alcohol use: Yes    Alcohol/week: 2.0 standard drinks of alcohol    Types: 1 Cans of beer, 1 Standard drinks or equivalent per week    Comment: seldom   Drug use: No   Sexual activity: Not Currently  Other Topics Concern   Not on file  Social History Narrative   One level home with boyfriend   Caffeine - coffee 1-2 cups/day; Green tea 4-5 bottles a day   Exercise - some    Right handed      Social Drivers of Health   Financial Resource Strain: Low Risk  (04/05/2023)   Overall Financial Resource Strain (CARDIA)    Difficulty of Paying Living Expenses: Not very hard  Food Insecurity: No Food Insecurity (11/15/2023)   Hunger Vital Sign    Worried About Running Out of Food in the Last Year: Never true    Ran Out of Food in the Last Year: Never true  Transportation Needs: Unmet Transportation Needs (11/15/2023)   PRAPARE - Administrator, Civil Service (Medical): Yes    Lack of Transportation (Non-Medical): No  Physical Activity: Insufficiently Active (04/05/2023)   Exercise Vital Sign    Days of Exercise per Week: 3 days    Minutes of Exercise per Session: 20 min  Stress: Stress Concern Present (04/05/2023)   [employer] of Occupational Health - Occupational Stress Questionnaire    Feeling of Stress : Very much   Social Connections: Moderately Isolated (11/15/2023)   Social Connection and Isolation Panel    Frequency of Communication with Friends and Family: More than three times a week    Frequency of Social Gatherings with Friends and Family: More than three times a week    Attends Religious Services: 1 to 4 times per year    Active Member of [employer] or Organizations: No    Attends Engineer, Structural: Never    Marital Status: Divorced     Family History: The patient's family history includes Alcohol abuse in her father; Cancer in an other family member; Heart attack in her father, mother, and another family member; Heart failure in an other family member; Hypertension in her father and mother. There is no history of Anesthesia problems, Hypotension, Malignant hyperthermia, Pseudochol deficiency, or Breast cancer. ROS:   Please see the history of present illness.    All 14 point review of systems negative except as described per history of present illness  EKGs/Labs/Other Studies Reviewed:         Recent Labs: 01/29/2024: TSH 1.490 04/19/2024: Magnesium  2.0 06/29/2024: ALT 5; BUN 29; Creatinine 1.89; Hemoglobin 9.4; Platelet Count 321; Potassium 2.9; Sodium 142  Recent Lipid Panel    Component Value Date/Time   CHOL 231 (H) 09/25/2023 1449   TRIG 117 09/25/2023 1449   HDL 87 09/25/2023 1449   CHOLHDL 2.7 09/25/2023 1449   LDLCALC 124 (H) 09/25/2023 1449    Physical Exam:    VS:  BP (!) 90/58 (BP Location: Left Arm, Patient Position: Sitting)   Pulse (!) 107   Ht 5' (1.524 m)   Wt 120 lb (54.4 kg)   SpO2 96%   BMI 23.44 kg/m     Wt Readings from Last 3 Encounters:  07/07/24 120 lb (54.4 kg)  06/29/24 120 lb (54.4 kg)  06/14/24 125 lb 12.8 oz (57.1 kg)  GEN:  Well nourished, well developed in no acute distress HEENT: Normal NECK: No JVD; No carotid bruits LYMPHATICS: No lymphadenopathy CARDIAC: RRR, no murmurs, no rubs, no gallops RESPIRATORY:  Clear to  auscultation without rales, wheezing or rhonchi  ABDOMEN: Soft, non-tender, non-distended MUSCULOSKELETAL:  No edema; No deformity  SKIN: Warm and dry LOWER EXTREMITIES: no swelling NEUROLOGIC:  Alert and oriented x 3 PSYCHIATRIC:  Normal affect   ASSESSMENT:    1. Chronic systolic congestive heart failure, NYHA class 2 (HCC)   2. Coronary artery disease involving native coronary artery of native heart without angina pectoris   3. Dilated cardiomyopathy (HCC)   4. COPD exacerbation (HCC)   5. Urothelial carcinoma of bladder (HCC)   6. 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 Dyslipidemia    PLAN:    In order of problems listed above:  History of cardiomyopathy with congestive heart failure.  Her legs are swollen she is on palliative care attending goal of therapy is her comfort, therefore, I will discontinue her Entresto  and I will increase dose of Lasix  to 60 mg daily, will watch her kidney function. Congestive heart failure mild decompensation noted on physical exam with swelling of lower extremities plan as described above. COPD noted stable. ICD present normal function. Dyslipidemia with a secondary issue right now with her cancer with metastasis and palliative care.   Medication Adjustments/Labs and Tests Ordered: Current medicines are reviewed at length with the patient today.  Concerns regarding medicines are outlined above.  Orders Placed This Encounter  Procedures   EKG 12-Lead   Medication changes: No orders of the defined types were placed in this encounter.   Signed, Lamar DOROTHA Fitch, MD, [facility] 07/07/2024 4:58 PM    [facility]

## 2024-07-07 NOTE — Patient Instructions (Addendum)
 Medication Instructions:  STOP: sacubitril -valsartan  (ENTRESTO ) 24-26 MG   INCREASE: Lasix  ( Furosemide ) to 60mg  daily   Lab Work: Your physician recommends that you return for lab work in: 1 week   You can come Monday through Friday 8:30 am to 12:00 pm and 1:15 to 4:30. You do not need to make an appointment as the order has already been placed.     Testing/Procedures: None Ordered   Follow-Up: At [facility], you and your health needs are our priority.  As part of our continuing mission to provide you with exceptional heart care, we have created designated Provider Care Teams.  These Care Teams include your primary Cardiologist (physician) and Advanced Practice Providers (APPs -  Physician Assistants and Nurse Practitioners) who all work together to provide you with the care you need, when you need it.  We recommend signing up for the patient portal called MyChart.  Sign up information is provided on this After Visit Summary.  MyChart is used to connect with patients for Virtual Visits (Telemedicine).  Patients are able to view lab/test results, encounter notes, upcoming appointments, etc.  Non-urgent messages can be sent to your provider as well.   To learn more about what you can do with MyChart, go to [email or web address].    Your next appointment:   6 month follow up with RJK  The format for your next appointment:   In Person  Provider:   Lamar Fitch, MD    Other Instructions NA

## 2024-07-08 ENCOUNTER — Inpatient Hospital Stay (HOSPITAL_BASED_OUTPATIENT_CLINIC_OR_DEPARTMENT_OTHER): Admission: RE | Admit: 2024-07-08 | Admitting: Radiology

## 2024-07-08 ENCOUNTER — Other Ambulatory Visit: Payer: Self-pay

## 2024-07-08 ENCOUNTER — Inpatient Hospital Stay (HOSPITAL_COMMUNITY)
Admission: EM | Admit: 2024-07-08 | Discharge: 2024-07-26 | DRG: 391 | Disposition: E | Attending: Internal Medicine | Admitting: Internal Medicine

## 2024-07-08 ENCOUNTER — Telehealth: Payer: Self-pay | Admitting: *Deleted

## 2024-07-08 ENCOUNTER — Encounter (HOSPITAL_COMMUNITY): Payer: Self-pay

## 2024-07-08 ENCOUNTER — Emergency Department (HOSPITAL_COMMUNITY)

## 2024-07-08 DIAGNOSIS — Z515 Encounter for palliative care: Secondary | ICD-10-CM

## 2024-07-08 DIAGNOSIS — R570 Cardiogenic shock: Secondary | ICD-10-CM | POA: Diagnosis not present

## 2024-07-08 DIAGNOSIS — G253 Myoclonus: Secondary | ICD-10-CM | POA: Diagnosis not present

## 2024-07-08 DIAGNOSIS — N179 Acute kidney failure, unspecified: Secondary | ICD-10-CM | POA: Diagnosis not present

## 2024-07-08 DIAGNOSIS — R112 Nausea with vomiting, unspecified: Secondary | ICD-10-CM

## 2024-07-08 DIAGNOSIS — E860 Dehydration: Secondary | ICD-10-CM | POA: Diagnosis present

## 2024-07-08 DIAGNOSIS — M069 Rheumatoid arthritis, unspecified: Secondary | ICD-10-CM | POA: Diagnosis present

## 2024-07-08 DIAGNOSIS — E43 Unspecified severe protein-calorie malnutrition: Secondary | ICD-10-CM | POA: Diagnosis present

## 2024-07-08 DIAGNOSIS — R579 Shock, unspecified: Secondary | ICD-10-CM

## 2024-07-08 DIAGNOSIS — I5043 Acute on chronic combined systolic (congestive) and diastolic (congestive) heart failure: Secondary | ICD-10-CM | POA: Diagnosis not present

## 2024-07-08 DIAGNOSIS — R739 Hyperglycemia, unspecified: Secondary | ICD-10-CM | POA: Diagnosis present

## 2024-07-08 DIAGNOSIS — N1831 Chronic kidney disease, stage 3a: Secondary | ICD-10-CM | POA: Diagnosis present

## 2024-07-08 DIAGNOSIS — I340 Nonrheumatic mitral (valve) insufficiency: Secondary | ICD-10-CM | POA: Diagnosis present

## 2024-07-08 DIAGNOSIS — I2510 Atherosclerotic heart disease of native coronary artery without angina pectoris: Secondary | ICD-10-CM | POA: Diagnosis present

## 2024-07-08 DIAGNOSIS — Z811 Family history of alcohol abuse and dependence: Secondary | ICD-10-CM

## 2024-07-08 DIAGNOSIS — K920 Hematemesis: Secondary | ICD-10-CM | POA: Diagnosis present

## 2024-07-08 DIAGNOSIS — M797 Fibromyalgia: Secondary | ICD-10-CM | POA: Diagnosis present

## 2024-07-08 DIAGNOSIS — E782 Mixed hyperlipidemia: Secondary | ICD-10-CM | POA: Diagnosis present

## 2024-07-08 DIAGNOSIS — G894 Chronic pain syndrome: Secondary | ICD-10-CM | POA: Diagnosis present

## 2024-07-08 DIAGNOSIS — C679 Malignant neoplasm of bladder, unspecified: Secondary | ICD-10-CM

## 2024-07-08 DIAGNOSIS — Z66 Do not resuscitate: Secondary | ICD-10-CM | POA: Diagnosis not present

## 2024-07-08 DIAGNOSIS — M810 Age-related osteoporosis without current pathological fracture: Secondary | ICD-10-CM | POA: Diagnosis present

## 2024-07-08 DIAGNOSIS — Z87891 Personal history of nicotine dependence: Secondary | ICD-10-CM

## 2024-07-08 DIAGNOSIS — N1330 Unspecified hydronephrosis: Secondary | ICD-10-CM | POA: Diagnosis present

## 2024-07-08 DIAGNOSIS — C7951 Secondary malignant neoplasm of bone: Secondary | ICD-10-CM | POA: Diagnosis present

## 2024-07-08 DIAGNOSIS — K922 Gastrointestinal hemorrhage, unspecified: Secondary | ICD-10-CM | POA: Diagnosis present

## 2024-07-08 DIAGNOSIS — J441 Chronic obstructive pulmonary disease with (acute) exacerbation: Secondary | ICD-10-CM | POA: Diagnosis present

## 2024-07-08 DIAGNOSIS — Z79891 Long term (current) use of opiate analgesic: Secondary | ICD-10-CM

## 2024-07-08 DIAGNOSIS — E876 Hypokalemia: Secondary | ICD-10-CM | POA: Diagnosis not present

## 2024-07-08 DIAGNOSIS — R8290 Unspecified abnormal findings in urine: Secondary | ICD-10-CM | POA: Diagnosis present

## 2024-07-08 DIAGNOSIS — Z96642 Presence of left artificial hip joint: Secondary | ICD-10-CM | POA: Diagnosis present

## 2024-07-08 DIAGNOSIS — I2489 Other forms of acute ischemic heart disease: Secondary | ICD-10-CM | POA: Diagnosis present

## 2024-07-08 DIAGNOSIS — Z7982 Long term (current) use of aspirin: Secondary | ICD-10-CM

## 2024-07-08 DIAGNOSIS — A419 Sepsis, unspecified organism: Secondary | ICD-10-CM | POA: Diagnosis not present

## 2024-07-08 DIAGNOSIS — Z7951 Long term (current) use of inhaled steroids: Secondary | ICD-10-CM

## 2024-07-08 DIAGNOSIS — Z6823 Body mass index (BMI) 23.0-23.9, adult: Secondary | ICD-10-CM

## 2024-07-08 DIAGNOSIS — Z9221 Personal history of antineoplastic chemotherapy: Secondary | ICD-10-CM

## 2024-07-08 DIAGNOSIS — E872 Acidosis, unspecified: Secondary | ICD-10-CM | POA: Diagnosis present

## 2024-07-08 DIAGNOSIS — J9621 Acute and chronic respiratory failure with hypoxia: Secondary | ICD-10-CM | POA: Diagnosis not present

## 2024-07-08 DIAGNOSIS — K529 Noninfective gastroenteritis and colitis, unspecified: Secondary | ICD-10-CM | POA: Diagnosis not present

## 2024-07-08 DIAGNOSIS — I493 Ventricular premature depolarization: Secondary | ICD-10-CM | POA: Diagnosis present

## 2024-07-08 DIAGNOSIS — I130 Hypertensive heart and chronic kidney disease with heart failure and stage 1 through stage 4 chronic kidney disease, or unspecified chronic kidney disease: Secondary | ICD-10-CM | POA: Diagnosis present

## 2024-07-08 DIAGNOSIS — E861 Hypovolemia: Secondary | ICD-10-CM | POA: Diagnosis present

## 2024-07-08 DIAGNOSIS — I472 Ventricular tachycardia, unspecified: Secondary | ICD-10-CM | POA: Diagnosis present

## 2024-07-08 DIAGNOSIS — J9622 Acute and chronic respiratory failure with hypercapnia: Secondary | ICD-10-CM | POA: Diagnosis not present

## 2024-07-08 DIAGNOSIS — Z8551 Personal history of malignant neoplasm of bladder: Secondary | ICD-10-CM

## 2024-07-08 DIAGNOSIS — R6521 Severe sepsis with septic shock: Secondary | ICD-10-CM | POA: Diagnosis not present

## 2024-07-08 DIAGNOSIS — Z79899 Other long term (current) drug therapy: Secondary | ICD-10-CM

## 2024-07-08 DIAGNOSIS — Z9049 Acquired absence of other specified parts of digestive tract: Secondary | ICD-10-CM

## 2024-07-08 DIAGNOSIS — I700 Atherosclerosis of aorta: Secondary | ICD-10-CM | POA: Diagnosis present

## 2024-07-08 DIAGNOSIS — I420 Dilated cardiomyopathy: Secondary | ICD-10-CM | POA: Diagnosis present

## 2024-07-08 DIAGNOSIS — G9341 Metabolic encephalopathy: Secondary | ICD-10-CM | POA: Diagnosis not present

## 2024-07-08 DIAGNOSIS — K7200 Acute and subacute hepatic failure without coma: Secondary | ICD-10-CM | POA: Diagnosis not present

## 2024-07-08 DIAGNOSIS — Z8249 Family history of ischemic heart disease and other diseases of the circulatory system: Secondary | ICD-10-CM

## 2024-07-08 DIAGNOSIS — Z95810 Presence of automatic (implantable) cardiac defibrillator: Secondary | ICD-10-CM

## 2024-07-08 DIAGNOSIS — T451X5A Adverse effect of antineoplastic and immunosuppressive drugs, initial encounter: Secondary | ICD-10-CM | POA: Diagnosis not present

## 2024-07-08 DIAGNOSIS — R569 Unspecified convulsions: Secondary | ICD-10-CM | POA: Diagnosis not present

## 2024-07-08 DIAGNOSIS — I5023 Acute on chronic systolic (congestive) heart failure: Secondary | ICD-10-CM | POA: Diagnosis present

## 2024-07-08 DIAGNOSIS — Z555 Less than a high school diploma: Secondary | ICD-10-CM

## 2024-07-08 DIAGNOSIS — Z9861 Coronary angioplasty status: Secondary | ICD-10-CM

## 2024-07-08 LAB — COMPREHENSIVE METABOLIC PANEL WITH GFR
ALT: <5 U/L (ref 0–44)
AST: 15 U/L (ref 15–41)
Albumin: 3.2 g/dL — ABNORMAL LOW (ref 3.5–5.0)
Alkaline Phosphatase: 53 U/L (ref 38–126)
Anion gap: 16 — ABNORMAL HIGH (ref 5–15)
BUN: 16 mg/dL (ref 8–23)
CO2: 21 mmol/L — ABNORMAL LOW (ref 22–32)
Calcium: 8.8 mg/dL — ABNORMAL LOW (ref 8.9–10.3)
Chloride: 107 mmol/L (ref 98–111)
Creatinine, Ser: 1.16 mg/dL — ABNORMAL HIGH (ref 0.44–1.00)
GFR, Estimated: 50 mL/min — ABNORMAL LOW (ref >60)
Glucose, Bld: 108 mg/dL — ABNORMAL HIGH (ref 70–99)
Potassium: 2.5 mmol/L — CL (ref 3.5–5.1)
Sodium: 143 mmol/L (ref 135–145)
Total Bilirubin: 0.3 mg/dL (ref 0.0–1.2)
Total Protein: 6.1 g/dL — ABNORMAL LOW (ref 6.5–8.1)

## 2024-07-08 LAB — CBC WITH DIFFERENTIAL/PLATELET
Abs Immature Granulocytes: 0.13 K/uL — ABNORMAL HIGH (ref 0.00–0.07)
Basophils Absolute: 0.2 K/uL — ABNORMAL HIGH (ref 0.0–0.1)
Basophils Relative: 2 %
Eosinophils Absolute: 0.4 K/uL (ref 0.0–0.5)
Eosinophils Relative: 4 %
HCT: 29.8 % — ABNORMAL LOW (ref 36.0–46.0)
Hemoglobin: 9.8 g/dL — ABNORMAL LOW (ref 12.0–15.0)
Immature Granulocytes: 1 %
Lymphocytes Relative: 9 %
Lymphs Abs: 1 K/uL (ref 0.7–4.0)
MCH: 29.8 pg (ref 26.0–34.0)
MCHC: 32.9 g/dL (ref 30.0–36.0)
MCV: 90.6 fL (ref 80.0–100.0)
Monocytes Absolute: 0.5 K/uL (ref 0.1–1.0)
Monocytes Relative: 5 %
Neutro Abs: 8.3 K/uL — ABNORMAL HIGH (ref 1.7–7.7)
Neutrophils Relative %: 79 %
Platelets: 240 K/uL (ref 150–400)
RBC: 3.29 MIL/uL — ABNORMAL LOW (ref 3.87–5.11)
RDW: 17 % — ABNORMAL HIGH (ref 11.5–15.5)
WBC: 10.5 K/uL (ref 4.0–10.5)
nRBC: 0 % (ref 0.0–0.2)

## 2024-07-08 LAB — TYPE AND SCREEN
ABO/RH(D): O NEG
Antibody Screen: NEGATIVE

## 2024-07-08 LAB — URINALYSIS, ROUTINE W REFLEX MICROSCOPIC
Bilirubin Urine: NEGATIVE
Glucose, UA: NEGATIVE mg/dL
Ketones, ur: NEGATIVE mg/dL
Nitrite: NEGATIVE
Protein, ur: 100 mg/dL — AB
Specific Gravity, Urine: 1.012 (ref 1.005–1.030)
WBC, UA: >50 WBC/hpf (ref 0–5)
pH: 8 (ref 5.0–8.0)

## 2024-07-08 LAB — POC OCCULT BLOOD, ED: Fecal Occult Bld: NEGATIVE

## 2024-07-08 LAB — PROTIME-INR
INR: 1.1 (ref 0.8–1.2)
Prothrombin Time: 14.3 s (ref 11.4–15.2)

## 2024-07-08 LAB — MAGNESIUM: Magnesium: 1.1 mg/dL — ABNORMAL LOW (ref 1.7–2.4)

## 2024-07-08 LAB — APTT: aPTT: 30 s (ref 24–36)

## 2024-07-08 LAB — LIPASE, BLOOD: Lipase: 15 U/L (ref 11–51)

## 2024-07-08 MED ORDER — PROCHLORPERAZINE EDISYLATE 10 MG/2ML IJ SOLN
10.0000 mg | Freq: Four times a day (QID) | INTRAMUSCULAR | Status: DC | PRN
  Administered 2024-07-08: 10 mg via INTRAVENOUS
  Filled 2024-07-08: qty 2

## 2024-07-08 MED ORDER — POTASSIUM CHLORIDE 10 MEQ/100ML IV SOLN
10.0000 meq | INTRAVENOUS | Status: AC
  Administered 2024-07-08 (×3): 10 meq via INTRAVENOUS
  Filled 2024-07-08 (×3): qty 100

## 2024-07-08 MED ORDER — LORAZEPAM 2 MG/ML IJ SOLN
0.5000 mg | Freq: Once | INTRAMUSCULAR | Status: AC
  Administered 2024-07-08: 0.5 mg via INTRAVENOUS
  Filled 2024-07-08: qty 1

## 2024-07-08 MED ORDER — ONDANSETRON HCL 4 MG/2ML IJ SOLN
4.0000 mg | Freq: Four times a day (QID) | INTRAMUSCULAR | Status: DC | PRN
  Administered 2024-07-08: 4 mg via INTRAVENOUS
  Filled 2024-07-08: qty 2

## 2024-07-08 MED ORDER — CARVEDILOL 3.125 MG PO TABS
3.1250 mg | ORAL_TABLET | Freq: Two times a day (BID) | ORAL | Status: DC
  Administered 2024-07-08: 3.125 mg via ORAL
  Filled 2024-07-08: qty 1

## 2024-07-08 MED ORDER — ACETAMINOPHEN 325 MG PO TABS
650.0000 mg | ORAL_TABLET | Freq: Four times a day (QID) | ORAL | Status: DC | PRN
Start: 2024-07-08 — End: 2024-07-09
  Administered 2024-07-09: 650 mg via ORAL
  Filled 2024-07-08 (×2): qty 2

## 2024-07-08 MED ORDER — MAGNESIUM SULFATE 2 GM/50ML IV SOLN
2.0000 g | Freq: Once | INTRAVENOUS | Status: AC
  Administered 2024-07-08: 2 g via INTRAVENOUS
  Filled 2024-07-08: qty 50

## 2024-07-08 MED ORDER — SERTRALINE HCL 100 MG PO TABS
50.0000 mg | ORAL_TABLET | Freq: Every day | ORAL | Status: DC
  Administered 2024-07-09: 50 mg via ORAL
  Filled 2024-07-08: qty 1

## 2024-07-08 MED ORDER — SODIUM CHLORIDE 0.9 % IV SOLN: 1.0000 g | Freq: Once | INTRAVENOUS | Status: DC

## 2024-07-08 MED ORDER — SODIUM CHLORIDE 0.9 % IV SOLN
2.0000 g | INTRAVENOUS | Status: DC
  Administered 2024-07-08: 2 g via INTRAVENOUS
  Filled 2024-07-08: qty 20

## 2024-07-08 MED ORDER — CARVEDILOL 3.125 MG PO TABS: 3.1250 mg | ORAL_TABLET | Freq: Two times a day (BID) | ORAL | Status: DC

## 2024-07-08 MED ORDER — METRONIDAZOLE 500 MG/100ML IV SOLN
500.0000 mg | Freq: Two times a day (BID) | INTRAVENOUS | Status: DC
  Administered 2024-07-08 – 2024-07-09 (×2): 500 mg via INTRAVENOUS
  Filled 2024-07-08 (×2): qty 100

## 2024-07-08 MED ORDER — IOHEXOL 300 MG/ML  SOLN
80.0000 mL | Freq: Once | INTRAMUSCULAR | Status: AC | PRN
  Administered 2024-07-08: 80 mL via INTRAVENOUS

## 2024-07-08 MED ORDER — POTASSIUM CHLORIDE 10 MEQ/100ML IV SOLN
10.0000 meq | INTRAVENOUS | Status: AC
  Administered 2024-07-08 (×2): 10 meq via INTRAVENOUS
  Filled 2024-07-08 (×2): qty 100

## 2024-07-08 MED ORDER — LACTATED RINGERS IV BOLUS
1000.0000 mL | Freq: Once | INTRAVENOUS | Status: AC
  Administered 2024-07-08: 1000 mL via INTRAVENOUS

## 2024-07-08 MED ORDER — METHADONE HCL 10 MG PO TABS
5.0000 mg | ORAL_TABLET | Freq: Three times a day (TID) | ORAL | Status: DC
  Administered 2024-07-08 – 2024-07-09 (×2): 5 mg via ORAL
  Filled 2024-07-08 (×2): qty 1

## 2024-07-08 MED ORDER — SODIUM CHLORIDE 0.9 % IV BOLUS
1000.0000 mL | Freq: Once | INTRAVENOUS | Status: AC
  Administered 2024-07-08: 1000 mL via INTRAVENOUS

## 2024-07-08 MED ORDER — ACETAMINOPHEN 650 MG RE SUPP: 650.0000 mg | Freq: Four times a day (QID) | RECTAL | Status: DC | PRN

## 2024-07-08 NOTE — Significant Event (Signed)
 Hospitalist cross cover  I was notified that the patient is persistently tachycardic which is new from earlier.  Heart rates in the upper 130s, sinus tach.   - The patient was complaining of pain and she is on methadone  maintenance.  She was given a dose of her methadone  which was past due.  That did not seem to help her heart rate -IV fluids were started.  She had been persistently vomiting.  She had received fluids through her multiple doses of potassium and magnesium  replacement.  So far that has not helped her heart rate -The patient did receive a dose of 0.5 mg IV Ativan  for persistent nausea despite Compazine  and Zofran . That led to her being slightly oversedated for a short time, then she started hallucinating. She is having an animated conversation with herself with her eyes closed.  She does not appear to be short of breath as she is chatting about a helicopter. - She is on a small dose of carvedilol  at baseline.  That dose has been ordered but she still a little too out of it to swallow. - Hold off any any IV medication because her systolic blood pressures are in the 105 - 110 range. Continue IV fluids   Plan: Persistent tachycardia  Her heart rate is a little fast for this to be all volume depletion. Continue to monitor. ABG ordered. Will repeat labs

## 2024-07-08 NOTE — H&P (Addendum)
 History and Physical    Patient: [name] FMW:[id number] DOB: 08-20-1956 DOA: 07/08/2024 DOS: the patient was seen and examined on 07/08/2024 PCP: Teressa Harrie HERO, FNP  Patient coming from: Home  Chief Complaint:  Chief Complaint  Patient presents with   Hematemesis   Diarrhea   HPI: [name] is a 68 y.o. female with medical history significant for nonischemic cardiomyopathy EF 35%, AICD, hypertension, fibromyalgia, bladder cancer with bone mets now in palliative care, dyslipidemia, COPD and methadone  maintenance.  She is getting chemotherapy.  Last dose was last week. The patient was in her usual state of health until last night.  She started vomiting repeatedly and having diarrhea last night.  She did see some blood in her vomit.  One of her episodes of diarrhea was pink.  The patient has had multiple episodes of vomiting today in the ER without any evidence of blood.  She has not seen any further blood in her stool either.  Her evaluation in the emergency department included blood work which revealed a potassium of 2.5 and a magnesium  level of 1.1.  Her creatinine was also 1.16 though this is a little better than her baseline which appears to be around 1.6. She had a CT scan of her abdomen pelvis which revealed pancolitis but no obstruction.  She does have a small right hydronephrosis but there was a right pigtail ureteral stent in the distal ureter.  She has a chronic Foley in place. The patient continues to have a tender abdomen and continues to vomit in the emergency department.  She will be admitted to the hospitalist service for electrolyte repletion and monitoring.  She was not started on IV antibiotics in the emergency department as the pancolitis was felt to be inflammatory rather than infectious as she has no fever and no elevated white blood cell count.  She was fecal occult blood negative in the emergency department.     Review of Systems: As mentioned in the history  of present illness. All other systems reviewed and are negative. Past Medical History:  Diagnosis Date   Abnormality of gait due to impairment of balance 11/22/2020   Admission for long-term opiate analgesic use 10/24/2019   AKI (acute kidney injury) 11/14/2023   Anemia of chronic disease 06/07/2023   Arthralgia of both hands 04/07/2023   Arthritis of right hip 05/29/2016   Formatting of this note might be different from the original. Added automatically from request for surgery [id number]   Arthritis of right knee 11/27/2016   At risk for side effect of medication 12/18/2023   Bilateral lower extremity edema 06/07/2023   BMI 26.0-26.9,adult 03/29/2020   Bone lesion 11/18/2023   Cardiomyopathy (HCC)    Overview:  Ejection fraction 45% in 2015 Ejection fraction 30 to 35% in November 2018   Chronic active hepatitis (HCC) 01/04/2023   Chronic back pain    Chronic hypoxemic respiratory failure (HCC) 10/24/2019   Chronic narcotic use 03/23/2015   Chronic pain of both knees 12/21/2018   Added automatically from request for surgery [id number]  Formatting of this note might be different from the original. 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   Dyslipidemia 05/31/2015   Elevated serum glucose 06/23/2023   Fatigue 06/23/2023   Folate deficiency 01/08/2024   GERD (gastroesophageal reflux disease)    History of total hip replacement 09/25/2016   Hypoaldosteronism 07/13/2020   Hypotension 05/17/2020   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   Ileus following gastrointestinal surgery (HCC) 03/26/2015   Iron deficiency 11/21/2023   Low vitamin B12 level 11/21/2023   Major depressive disorder, single episode, moderate (HCC) 10/24/2019   Malnutrition of moderate degree 10/20/2020   Mesenteric ischemia 10/16/2020   Migraine without aura and without status migrainosus, not intractable 01/04/2023   Mixed hyperlipidemia 05/31/2015   Mixed incontinence 10/20/2020   Moderate protein-calorie malnutrition 11/03/2023   Nausea with vomiting    Nausea without vomiting 01/29/2024   Nephrostomy present (HCC) 12/01/2023   Neuropathy 01/29/2024   Normocytic anemia 06/23/2023   Osteoporosis 10/24/2019   Other spondylosis with radiculopathy, lumbar region 10/24/2019   Paraplegia (HCC) 11/14/2023   Presence of left artificial hip joint 10/24/2019   Primary insomnia 01/04/2023   PTSD (post-traumatic stress disorder)    Renal lesion 11/05/2023   Rheumatoid arthritis involving multiple sites (HCC) 04/07/2023   Senile osteoporosis 10/24/2019   Serosanguineous chronic otitis media of right ear 08/02/2021   Therapeutic opioid induced constipation 01/04/2023   Thyroid  disease    Urge incontinence of urine 11/03/2023   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   Vertebral fracture, pathological 12/01/2023   Vitamin D  deficiency 09/25/2023   Past Surgical History:  Procedure Laterality Date   APPENDECTOMY     CARDIAC CATHETERIZATION     CARPAL TUNNEL RELEASE     CESAREAN SECTION     CHF s/p AICD   12/2010    CHOLECYSTECTOMY     COLONOSCOPY  10/16/2015   Mild colonic diverticulosis, predominantly in the left colon. Small internal hemrrhoids. Otherwise normal colonoscopy   COLONOSCOPY WITH PROPOFOL  N/A 09/27/2021   Procedure: COLONOSCOPY WITH PROPOFOL ;  Surgeon: Shila Gustav GAILS, MD;  Location: [facility];  Service: Endoscopy;  Laterality: N/A;   CORONARY ANGIOPLASTY     ESOPHAGOGASTRODUODENOSCOPY  02/04/2014   Mild gastritis. Otherwise normal EGD   HAND SURGERY Bilateral    ICD GENERATOR CHANGEOUT N/A 05/21/2019   Procedure: ICD GENERATOR CHANGEOUT;  Surgeon: Inocencio Soyla Lunger, MD;  Location: [facility] INVASIVE CV LAB;  Service: Cardiovascular;  Laterality: N/A;   ICD IMPLANT     Medtr[employer]   intestinal blockage 2011     IR IMAGING GUIDED PORT INSERTION  12/24/2023   IR NEPHRO TUBE REMOV/FL  06/02/2024   IR NEPHROSTOMY PLACEMENT RIGHT  11/16/2023   IR URETERAL STENT PLACEMENT EXISTING ACCESS RIGHT  12/24/2023   LAPAROSCOPIC ASSISTED VENTRAL HERNIA REPAIR N/A 03/23/2015   Procedure: LAPAROSCOPIC VENTRAL WALL HERNIA REPAIR;  Surgeon: Elspeth Schultze, MD;  Location: [facility];  Service: General;  Laterality: N/A;  With MESH   LAPAROSCOPIC LYSIS OF ADHESIONS N/A 03/23/2015   Procedure: LAPAROSCOPIC LYSIS OF ADHESIONS;  Surgeon: Elspeth Schultze, MD;  Location: [facility];  Service: General;  Laterality: N/A;   LSCS      x2   NASAL SEPTUM SURGERY     NECK SURGERY     fused   TONSILLECTOMY     TRANSURETHRAL RESECTION OF BLADDER TUMOR N/A 11/16/2023   Procedure: TURBT (TRANSURETHRAL RESECTION OF BLADDER TUMOR);  Surgeon: [name] Lonni Righter, MD;  Location: [facility];  Service: Urology;  Laterality: N/A;   TYMPANOSTOMY TUBE PLACEMENT Right 2024   ULNAR NERVE TRANSPOSITION  01/23/2012   Procedure: ULNAR NERVE DECOMPRESSION/TRANSPOSITION;  Surgeon: Reyes JONETTA Budge, MD;  Location: [facility];  Service: Neurosurgery;  Laterality: Left;  LEFT ulnar nerve decompression   Social History:  reports that she  quit smoking about 4 years ago. Her smoking use included cigarettes. She started smoking about 34 years ago. She has a 15 pack-year smoking history. She has never used smokeless tobacco. She reports current alcohol use of about 2.0 standard drinks of alcohol per week. She reports that she does not use drugs.  Allergies  Allergen Reactions   Lyrica  [Pregabalin ] Other (See Comments)    Jerking, twitching, memory changes     Family History  Problem Relation Age of Onset   Hypertension Mother    Heart attack Mother    Alcohol abuse Father    Hypertension Father    Heart attack Father    Heart attack Other    Cancer Other    Heart failure Other    Anesthesia problems Neg Hx    Hypotension Neg Hx    Malignant hyperthermia Neg Hx    Pseudochol deficiency Neg Hx    Breast cancer Neg Hx     Prior to Admission medications   Medication Sig Start Date End Date Taking? Authorizing Provider  esomeprazole  (NEXIUM ) 20 MG capsule TAKE ONE (1) CAPSULE BY MOUTH ONCE DAILY 02/24/24  Yes Cox, Kirsten, MD  methadone  (DOLOPHINE ) 10 MG tablet Take 1 tablet (10 mg total) by mouth every 8 (eight) hours. Patient taking differently: Take 10 mg by mouth every 8 (eight) hours as needed (for pain). 06/14/24  Yes Pickenpack-Cousar, Fannie SAILOR, NP  ondansetron  (ZOFRAN ) 8 MG tablet Take 1 tablet (8 mg total) by mouth every 8 (eight) hours as needed for nausea or vomiting. 12/01/23  Yes Tina Pauletta BROCKS, MD  Oxycodone  HCl 20 MG TABS Take 1 tablet (20 mg total) by mouth every 4 (four) hours as needed. Patient taking differently: Take 20 mg by mouth every 4 (four) hours as needed (for pain). 07/04/24  Yes Pickenpack-Cousar, Fannie SAILOR, NP  ARIPiprazole  (ABILIFY ) 5 MG tablet TAKE 1 TABLET BY MOUTH DAILY 10/07/23   Cox, Kirsten, MD  aspirin  81 MG tablet Take 81 mg by mouth daily.    [provider]  BREO ELLIPTA  100-25 MCG/ACT AEPB INHALE ONE (1) PUFF BY MOUTH DAILY 05/25/24   Teressa Harrie HERO, FNP   Carboxymethylcell-Glycerin  PF 0.5-0.9 % SOLN Place 2 drops into both eyes 4 (four) times daily. Patient not taking: Reported on 07/07/2024 12/01/23   Tina Pauletta BROCKS, MD  carvedilol  (COREG ) 3.125 MG tablet TAKE ONE (1) TABLET BY MOUTH TWICE DAILY WITH MEALS Patient taking differently: Take 3.125 mg by mouth 2 (two) times daily with a meal. 11/26/23   Teressa Harrie HERO, FNP  denosumab  (PROLIA ) 60 MG/ML SOSY injection Inject 60 mg into the skin every 6 (six) months. 02/14/20   Abran Jerilynn Loving, MD  dextromethorphan  15 MG/5ML syrup Take 10 mLs (30 mg total) by mouth 4 (four) times daily as needed for  cough. 10/22/23   Teressa Harrie HERO, FNP  diclofenac  Sodium (VOLTAREN ) 1 % GEL Apply 2 g topically 4 (four) times daily. 05/01/21   Abran Jerilynn Loving, MD  diphenoxylate -atropine  (LOMOTIL ) 2.5-0.025 MG tablet Take 1 tablet by mouth 4 (four) times daily as needed for diarrhea or loose stools. 07/04/24   Pickenpack-Cousar, Athena N, NP  dronabinol  (MARINOL ) 5 MG capsule Take 1 capsule (5 mg total) by mouth 2 (two) times daily before lunch and supper. Patient not taking: No sig reported 03/15/24   Pickenpack-Cousar, Fannie SAILOR, NP  ENTRESTO  24-26 MG TAKE ONE (1) TABLET BY MOUTH TWICE DAILY 11/26/23   Teressa Harrie HERO, FNP  EPINEPHRINE  0.3 mg/0.3 mL IJ SOAJ injection Inject 0.3 mLs (0.3 mg total) into the muscle as needed for anaphylaxis. Patient not taking: Reported on 07/07/2024 04/27/20   Abran Jerilynn Loving, MD  FARXIGA 5 MG TABS tablet Take 5 mg by mouth daily. 10/10/23   [provider]  ferrous sulfate  325 (65 FE) MG tablet Take 1 tablet (325 mg total) by mouth at bedtime. 11/24/23   Gherghe, Costin M, MD  fluticasone  (FLONASE ) 50 MCG/ACT nasal spray USE 2 SPRAYS IN EACH NOSTRILS DAILY AS NEEDED Patient not taking: Reported on 07/07/2024 07/18/23   Teressa Harrie HERO, FNP  folic acid  (FOLVITE ) 1 MG tablet Take 1 tablet (1 mg total) by mouth daily. Patient not taking: Reported on 07/07/2024 01/08/24   Tina Pauletta BROCKS, MD  furosemide  (LASIX ) 20 MG tablet Take 3 tablets (60 mg total) by mouth daily. 07/07/24   Krasowski, Robert J, MD  ipratropium-albuterol  (DUONEB) 0.5-2.5 (3) MG/3ML SOLN USE 1 VIAL VIA NEBULIZER EVERY 6 HOURS AS NEEDED FOR SHORTNESS OF BREATH Patient taking differently: Take 3 mLs by nebulization every 6 (six) hours as needed (SOB). 10/16/23   Teressa Harrie HERO, FNP  levocetirizine (XYZAL) 5 MG tablet Take 5 mg by mouth daily as needed for allergies. 12/13/19   [provider]  lidocaine  (LIDODERM ) 5 % Place 1 patch onto the skin daily. Remove & Discard patch within 12 hours or as directed by MD Patient not taking: Reported on 07/07/2024 11/24/23   Trixie Nilda HERO, MD  lidocaine -prilocaine  (EMLA ) cream Apply to affected area once Patient not taking: Reported on 07/07/2024 12/01/23   Tina Pauletta BROCKS, MD  lubiprostone  (AMITIZA ) 24 MCG capsule TAKE 1 CAPSULE(24 MCG) BY MOUTH TWICE DAILY WITH A MEAL Patient taking differently: Take 24 mcg by mouth 2 (two) times daily with a meal. 09/15/23   Cox, Kirsten, MD  methadone  (DOLOPHINE ) 5 MG tablet Take 1 tablet (5 mg total) by mouth every 8 (eight) hours. (Take in addition to 10mg  (Total 15mg )). 07/01/24   Pickenpack-Cousar, Athena N, NP  methylPREDNISolone  (MEDROL  DOSEPAK) 4 MG TBPK tablet If significant rash, will start at 6 tabs on day 1, then 5 tabs on day 2, then 4 tabs on day 3, then 3 tabs on day 4, then 2 tabs on day 5 and then 1 tab on day 6. Take with food. 12/25/23   Tina Pauletta BROCKS, MD  metoCLOPramide  (REGLAN ) 10 MG tablet Take 1 tablet (10 mg total) by mouth every 8 (eight) hours as needed for nausea. 01/07/24   Pickenpack-Cousar, Athena N, NP  Multiple Vitamin (MULTIVITAMIN WITH MINERALS) TABS tablet Take 1 tablet by mouth daily. 10/30/20   Sebastian Toribio GAILS, MD  naloxone  (NARCAN ) nasal spray 4 mg/0.1 mL Place 1 spray into the nose once. 01/12/21   [provider]  nitroGLYCERIN  (NITROSTAT ) 0.4  MG SL tablet Place 1 tablet (0.4 mg  total) under the tongue every 5 (five) minutes as needed for chest pain. 11/27/21   Krasowski, Robert J, MD  ondansetron  (ZOFRAN ) 4 MG tablet Take 1 tablet (4 mg total) by mouth every 8 (eight) hours as needed for nausea or vomiting. Patient not taking: Reported on 07/08/2024 05/17/20   Abran Jerilynn Loving, MD  potassium chloride  (KLOR-CON  M) 10 MEQ tablet Take 2 tablets by mouth twice daily for 3 days, then 1 tablet once daily 06/29/24   Tina Pauletta BROCKS, MD  prochlorperazine  (COMPAZINE ) 10 MG tablet TAKE 1 TABLET(10 MG) BY MOUTH EVERY 6 HOURS AS NEEDED FOR NAUSEA OR VOMITING 04/27/24   Tina Pauletta BROCKS, MD  ranolazine  (RANEXA ) 500 MG 12 hr tablet TAKE 1 TABLET BY MOUTH TWICE DAILY 10/28/23   Krasowski, Robert J, MD  rosuvastatin  (CRESTOR ) 20 MG tablet TAKE 1 TABLET BY MOUTH ONCE DAILY 02/24/24   Cox, Kirsten, MD  sertraline  (ZOLOFT ) 100 MG tablet TAKE 1/2 TABLET(50 MG) BY MOUTH DAILY Patient taking differently: Take 50 mg by mouth daily. 08/24/23   CoxAbigail, MD  sulfamethoxazole -trimethoprim  (BACTRIM  DS) 800-160 MG tablet Take 1 tablet by mouth 2 (two) times daily. 02/05/24   Tina Pauletta BROCKS, MD  tiZANidine  (ZANAFLEX ) 4 MG tablet Take 1 tablet (4 mg total) by mouth at bedtime. Patient not taking: Reported on 07/07/2024 06/07/24   Pickenpack-Cousar, Athena N, NP  topiramate  (TOPAMAX ) 50 MG tablet TAKE TWO (2) TABLETS BY MOUTH EVERY DAY AT BEDTIME Patient not taking: Reported on 07/07/2024 11/26/23   Teressa Harrie HERO, FNP  Ubrogepant  (UBRELVY ) 100 MG TABS Take 1 tablet (100 mg total) by mouth as needed (May repeat after 2 hours.  Maximum 2 tablets in 24 hours). TAKE 1 TABLET BY MOUTH BY MOUTH AS NEEDED( MAY REPEAT 1 TABLET AFTER 2 HOURS IF NEEDED, MAXIMUM 2 TABLETS IN 24 HOURS) Strength: 100 mg Patient not taking: Reported on 07/07/2024 04/07/23   Cox, Abigail, MD  Vitamin D , Ergocalciferol , (DRISDOL ) 1.25 MG (50000 UNIT) CAPS capsule TAKE 1 CAPSULE BY MOUTH EVERY 7 DAYS FOR 12 DOSES Patient taking  differently: Take 50,000 Units by mouth every 7 (seven) days. Patient usually takes this medication on mondays 06/30/23   Sherre Abigail, MD    Physical Exam: Vitals:   07/08/24 1100 07/08/24 1407 07/08/24 1615 07/08/24 1719  BP: 136/81 122/72 110/80   Pulse: 80 91 86   Resp: 16 14 14    Temp: (!) 97.4 F (36.3 C) (!) 97.4 F (36.3 C)    TempSrc: Oral Oral    SpO2: 100% 100% 100%   Weight:    54.4 kg  Height:    5' (1.524 m)   Physical Exam:  General: Pale, ill appearing, lethargic She had retching on several occasions during my examination HEENT: Normocephalic, atraumatic, PERRL Cardiovascular: Normal rate and rhythm. Distal pulses intact. Pulmonary: Normal pulmonary effort, normal breath sounds Gastrointestinal: Distended abdomen, tender in mid abdomen, hypoactive bowel sounds Musculoskeletal:Normal ROM, no lower ext edema Lymphadenopathy: No cervical LAD. Skin: Skin is warm and dry. Neuro: weak. Strength exam deferred because of all of her vomiting.  AAOx3. PSYCH: Appropriate and cooperative  Data Reviewed:  Results for orders placed or performed during the hospital encounter of 07/08/24 (from the past 24 hours)  Urinalysis, Routine w reflex microscopic -Urine, Clean Catch     Status: Abnormal   Collection Time: 07/08/24 11:13 AM  Result Value Ref Range   Color, Urine YELLOW YELLOW  APPearance CLOUDY (A) CLEAR   Specific Gravity, Urine 1.012 1.005 - 1.030   pH 8.0 5.0 - 8.0   Glucose, UA NEGATIVE NEGATIVE mg/dL   Hgb urine dipstick SMALL (A) NEGATIVE   Bilirubin Urine NEGATIVE NEGATIVE   Ketones, ur NEGATIVE NEGATIVE mg/dL   Protein, ur 899 (A) NEGATIVE mg/dL   Nitrite NEGATIVE NEGATIVE   Leukocytes,Ua LARGE (A) NEGATIVE   RBC / HPF 6-10 0 - 5 RBC/hpf   WBC, UA >50 0 - 5 WBC/hpf   Bacteria, UA FEW (A) NONE SEEN   Squamous Epithelial / HPF 0-5 0 - 5 /HPF   WBC Clumps PRESENT    Mucus PRESENT   CBC with Differential     Status: Abnormal   Collection Time: 07/08/24  11:17 AM  Result Value Ref Range   WBC 10.5 4.0 - 10.5 K/uL   RBC 3.29 (L) 3.87 - 5.11 MIL/uL   Hemoglobin 9.8 (L) 12.0 - 15.0 g/dL   HCT 70.1 (L) 63.9 - 53.9 %   MCV 90.6 80.0 - 100.0 fL   MCH 29.8 26.0 - 34.0 pg   MCHC 32.9 30.0 - 36.0 g/dL   RDW 82.9 (H) 88.4 - 84.4 %   Platelets 240 150 - 400 K/uL   nRBC 0.0 0.0 - 0.2 %   Neutrophils Relative % 79 %   Neutro Abs 8.3 (H) 1.7 - 7.7 K/uL   Lymphocytes Relative 9 %   Lymphs Abs 1.0 0.7 - 4.0 K/uL   Monocytes Relative 5 %   Monocytes Absolute 0.5 0.1 - 1.0 K/uL   Eosinophils Relative 4 %   Eosinophils Absolute 0.4 0.0 - 0.5 K/uL   Basophils Relative 2 %   Basophils Absolute 0.2 (H) 0.0 - 0.1 K/uL   Immature Granulocytes 1 %   Abs Immature Granulocytes 0.13 (H) 0.00 - 0.07 K/uL  Comprehensive metabolic panel     Status: Abnormal   Collection Time: 07/08/24 11:17 AM  Result Value Ref Range   Sodium 143 135 - 145 mmol/L   Potassium 2.5 (LL) 3.5 - 5.1 mmol/L   Chloride 107 98 - 111 mmol/L   CO2 21 (L) 22 - 32 mmol/L   Glucose, Bld 108 (H) 70 - 99 mg/dL   BUN 16 8 - 23 mg/dL   Creatinine, Ser 8.83 (H) 0.44 - 1.00 mg/dL   Calcium  8.8 (L) 8.9 - 10.3 mg/dL   Total Protein 6.1 (L) 6.5 - 8.1 g/dL   Albumin  3.2 (L) 3.5 - 5.0 g/dL   AST 15 15 - 41 U/L   ALT <5 0 - 44 U/L   Alkaline Phosphatase 53 38 - 126 U/L   Total Bilirubin 0.3 0.0 - 1.2 mg/dL   GFR, Estimated 50 (L) >60 mL/min   Anion gap 16 (H) 5 - 15  Lipase, blood     Status: None   Collection Time: 07/08/24 11:17 AM  Result Value Ref Range   Lipase 15 11 - 51 U/L  Protime-INR     Status: None   Collection Time: 07/08/24 11:17 AM  Result Value Ref Range   Prothrombin Time 14.3 11.4 - 15.2 seconds   INR 1.1 0.8 - 1.2  APTT     Status: None   Collection Time: 07/08/24 11:17 AM  Result Value Ref Range   aPTT 30 24 - 36 seconds  Magnesium      Status: Abnormal   Collection Time: 07/08/24 11:17 AM  Result Value Ref Range   Magnesium  1.1 (L) 1.7 -  2.4 mg/dL  Type and  screen [facility]     Status: None   Collection Time: 07/08/24 11:17 AM  Result Value Ref Range   ABO/RH(D) O NEG    Antibody Screen NEG    Sample Expiration      07/11/2024,2359 Performed at [facility], [address] [place], [place] [address]   POC occult blood, ED     Status: None   Collection Time: 07/08/24 11:20 AM  Result Value Ref Range   Fecal Occult Bld NEGATIVE NEGATIVE   *Note: Due to a large number of results and/or encounters for the requested time period, some results have not been displayed. A complete set of results can be found in Results Review.   CT Abdomen IMPRESSION: 1. Pancolitis. No bowel obstruction. 2. Mild right hydronephrosis. Right pigtail ureteral stent with proximal tip in the proximal right ureter. 3.  Aortic Atherosclerosis (ICD10-I70.0).    Assessment and Plan: # Pancolitis # Abnormal UA # Bladder cancer # Hypokalemia and hypomagnesemia # Intractable nausea and vomiting # Abdominal pain  - Replace potassium and magnesium  - She continues to vomit here in the ED. - Symptomatic care - It is unclear if this colitis is infectious, but will start Flagyl  and Rocephin  empirically at this time as she remains symptomatic and tender. - Given her bladder cancer it is possible that her UA always has leukocytes and blood.  There are no nitrates but her last CT did not show the hydronephrosis.  She does have a right ureteral stent in place.  Urology consultation. - Urine culture ordered - Hematemesis - resolved - Continue Methadone  once doses verified  # Metabolic acidosis - Treat above conditions and reevaluate.  She is not diabetic.   # Nonischemic cardiomyopathy -she appears as euvolemic but may be a touch dry.  Will hold Lasix .  # CKD -her creatinine is actually better than her baseline of 1.6.  Monitor    Advance Care Planning:   Code Status: Prior the patient names her fianc not her daughter as her  surrogate management consultant.  She wants to be full code.  Consults: Urology  Family Communication: Fianc at bedside  Severity of Illness: The appropriate patient status for this patient is INPATIENT. Inpatient status is judged to be reasonable and necessary in order to provide the required intensity of service to ensure the patient's safety. The patient's presenting symptoms, physical exam findings, and initial radiographic and laboratory data in the context of their chronic comorbidities is felt to place them at high risk for further clinical deterioration. Furthermore, it is not anticipated that the patient will be medically stable for discharge from the hospital within 2 midnights of admission.   * I certify that at the point of admission it is my clinical judgment that the patient will require inpatient hospital care spanning beyond 2 midnights from the point of admission due to high intensity of service, high risk for further deterioration and high frequency of surveillance required.*  Author: ARTHEA CHILD, MD 07/08/2024 6:03 PM  For on call review [email or web address].

## 2024-07-08 NOTE — Telephone Encounter (Signed)
 Daughter left a message stating [name] started throwing up last night around 8:30 and is now throwing up blood and is having abdominal pain. She is going to get her to the ED.  LM for daughter stating we received her message and agree that taking her to the ED is the right thing to do.

## 2024-07-08 NOTE — ED Triage Notes (Signed)
 Pt arrives via [facility] from home. Pt reports bright red bloody vomit beginning last night, and light pink diarrhea. PT reports that she has bone and bladder cancer, chemo treatment last week.

## 2024-07-08 NOTE — ED Provider Notes (Signed)
 [place] EMERGENCY DEPARTMENT AT [facility] Provider Note   CSN: [id number] Arrival date & time: 07/08/24  1051     Patient presents with: No chief complaint on file.   GWYNNETH [name] is a 68 y.o. female.   HPI   Patient presents because abdominal pain.  Nausea and vomiting.  Diarrhea.  Patient states that she started with vomiting yesterday.  States nothing out of control it.  Last night started noticing some bright red blood in her vomit as well.  Has been having diarrhea.  No dark stools.  No melena.  No bright red blood per rectum.  Patient states he is never had a thing like this happen before.  Endorses some generalized abdominal pain but mostly epigastric in nature.  Denies any fever or chills.  No chest pain or shortness of breath no pleuritic chest pain or hemoptysis.  No sick contacts that she is aware of. Denies  a history of GI bleed     Previous medical history reviewed : Followed up with cardiology yesterday.complex past medical history significant for nonischemic cardiomyopathy initial ejection fraction 35% last echocardiogram showed normal 11 to ejection fraction, moderate mitral valve regurgitation, ICD which is BiV, essential hypertension, fibromyalgia, she was find to have bladder cancer, now she is in palliative care.     Prior to Admission medications   Medication Sig Start Date End Date Taking? Authorizing Provider  ARIPiprazole  (ABILIFY ) 5 MG tablet TAKE 1 TABLET BY MOUTH DAILY 10/07/23   Cox, Kirsten, MD  aspirin  81 MG tablet Take 81 mg by mouth daily.    [provider]  BREO ELLIPTA  100-25 MCG/ACT AEPB INHALE ONE (1) PUFF BY MOUTH DAILY 05/25/24   Teressa Harrie HERO, FNP  Carboxymethylcell-Glycerin  PF 0.5-0.9 % SOLN Place 2 drops into both eyes 4 (four) times daily. Patient not taking: Reported on 07/07/2024 12/01/23   Tina Pauletta BROCKS, MD  carvedilol  (COREG ) 3.125 MG tablet TAKE ONE (1) TABLET BY MOUTH TWICE DAILY WITH MEALS Patient taking  differently: Take 3.125 mg by mouth 2 (two) times daily with a meal. 11/26/23   Teressa Harrie HERO, FNP  denosumab  (PROLIA ) 60 MG/ML SOSY injection Inject 60 mg into the skin every 6 (six) months. 02/14/20   Abran Jerilynn Loving, MD  dextromethorphan  15 MG/5ML syrup Take 10 mLs (30 mg total) by mouth 4 (four) times daily as needed for cough. Patient not taking: Reported on 07/07/2024 10/22/23   Teressa Harrie HERO, FNP  diclofenac  Sodium (VOLTAREN ) 1 % GEL Apply 2 g topically 4 (four) times daily. Patient not taking: Reported on 07/07/2024 05/01/21   Abran Jerilynn Loving, MD  diphenoxylate -atropine  (LOMOTIL ) 2.5-0.025 MG tablet Take 1 tablet by mouth 4 (four) times daily as needed for diarrhea or loose stools. 07/04/24   Pickenpack-Cousar, Athena N, NP  dronabinol  (MARINOL ) 5 MG capsule Take 1 capsule (5 mg total) by mouth 2 (two) times daily before lunch and supper. Patient not taking: Reported on 07/07/2024 03/15/24   Pickenpack-Cousar, Athena N, NP  ENTRESTO  24-26 MG TAKE ONE (1) TABLET BY MOUTH TWICE DAILY 11/26/23   Teressa Harrie HERO, FNP  EPINEPHRINE  0.3 mg/0.3 mL IJ SOAJ injection Inject 0.3 mLs (0.3 mg total) into the muscle as needed for anaphylaxis. Patient not taking: Reported on 07/07/2024 04/27/20   Abran Jerilynn Loving, MD  esomeprazole  (NEXIUM ) 20 MG capsule TAKE ONE (1) CAPSULE BY MOUTH ONCE DAILY Patient not taking: Reported on 07/07/2024 02/24/24   Sherre Clapper, MD  FARXIGA 5 MG  TABS tablet Take 5 mg by mouth daily. Patient not taking: Reported on 07/07/2024 10/10/23   [provider]  ferrous sulfate  325 (65 FE) MG tablet Take 1 tablet (325 mg total) by mouth at bedtime. Patient not taking: Reported on 07/07/2024 11/24/23   Gherghe, Costin M, MD  fluticasone  (FLONASE ) 50 MCG/ACT nasal spray USE 2 SPRAYS IN EACH NOSTRILS DAILY AS NEEDED Patient not taking: Reported on 07/07/2024 07/18/23   Teressa Harrie HERO, FNP  folic acid  (FOLVITE ) 1 MG tablet Take 1 tablet (1 mg total) by mouth  daily. Patient not taking: Reported on 07/07/2024 01/08/24   Tina Pauletta BROCKS, MD  furosemide  (LASIX ) 20 MG tablet Take 3 tablets (60 mg total) by mouth daily. 07/07/24   Krasowski, Robert J, MD  ipratropium-albuterol  (DUONEB) 0.5-2.5 (3) MG/3ML SOLN USE 1 VIAL VIA NEBULIZER EVERY 6 HOURS AS NEEDED FOR SHORTNESS OF BREATH Patient taking differently: Take 3 mLs by nebulization every 6 (six) hours as needed (SOB). 10/16/23   Teressa Harrie HERO, FNP  levocetirizine (XYZAL) 5 MG tablet Take 5 mg by mouth daily as needed for allergies. 12/13/19   [provider]  lidocaine  (LIDODERM ) 5 % Place 1 patch onto the skin daily. Remove & Discard patch within 12 hours or as directed by MD Patient not taking: Reported on 07/07/2024 11/24/23   Trixie Nilda HERO, MD  lidocaine -prilocaine  (EMLA ) cream Apply to affected area once Patient not taking: Reported on 07/07/2024 12/01/23   Tina Pauletta BROCKS, MD  lubiprostone  (AMITIZA ) 24 MCG capsule TAKE 1 CAPSULE(24 MCG) BY MOUTH TWICE DAILY WITH A MEAL Patient taking differently: Take 24 mcg by mouth 2 (two) times daily with a meal. 09/15/23   Cox, Kirsten, MD  methadone  (DOLOPHINE ) 10 MG tablet Take 1 tablet (10 mg total) by mouth every 8 (eight) hours. 06/14/24   Pickenpack-Cousar, Athena N, NP  methadone  (DOLOPHINE ) 5 MG tablet Take 1 tablet (5 mg total) by mouth every 8 (eight) hours. (Take in addition to 10mg  (Total 15mg )). 07/01/24   Pickenpack-Cousar, Athena N, NP  methylPREDNISolone  (MEDROL  DOSEPAK) 4 MG TBPK tablet If significant rash, will start at 6 tabs on day 1, then 5 tabs on day 2, then 4 tabs on day 3, then 3 tabs on day 4, then 2 tabs on day 5 and then 1 tab on day 6. Take with food. Patient not taking: Reported on 07/07/2024 12/25/23   Tina Pauletta BROCKS, MD  metoCLOPramide  (REGLAN ) 10 MG tablet Take 1 tablet (10 mg total) by mouth every 8 (eight) hours as needed for nausea. Patient not taking: Reported on 07/07/2024 01/07/24   Pickenpack-Cousar, Athena N, NP   Multiple Vitamin (MULTIVITAMIN WITH MINERALS) TABS tablet Take 1 tablet by mouth daily. 10/30/20   Sebastian Toribio GAILS, MD  naloxone  (NARCAN ) nasal spray 4 mg/0.1 mL Place 1 spray into the nose once. 01/12/21   [provider]  nitroGLYCERIN  (NITROSTAT ) 0.4 MG SL tablet Place 1 tablet (0.4 mg total) under the tongue every 5 (five) minutes as needed for chest pain. 11/27/21   Krasowski, Robert J, MD  ondansetron  (ZOFRAN ) 4 MG tablet Take 1 tablet (4 mg total) by mouth every 8 (eight) hours as needed for nausea or vomiting. 05/17/20   Abran Jerilynn Loving, MD  ondansetron  (ZOFRAN ) 8 MG tablet Take 1 tablet (8 mg total) by mouth every 8 (eight) hours as needed for nausea or vomiting. 12/01/23   Tina Pauletta BROCKS, MD  Oxycodone  HCl 20 MG TABS Take 1 tablet (20 mg  total) by mouth every 4 (four) hours as needed. 07/04/24   Pickenpack-Cousar, Athena N, NP  potassium chloride  (KLOR-CON  M) 10 MEQ tablet Take 2 tablets by mouth twice daily for 3 days, then 1 tablet once daily 06/29/24   Tina Pauletta BROCKS, MD  prochlorperazine  (COMPAZINE ) 10 MG tablet TAKE 1 TABLET(10 MG) BY MOUTH EVERY 6 HOURS AS NEEDED FOR NAUSEA OR VOMITING 04/27/24   Tina Pauletta BROCKS, MD  ranolazine  (RANEXA ) 500 MG 12 hr tablet TAKE 1 TABLET BY MOUTH TWICE DAILY 10/28/23   Krasowski, Robert J, MD  rosuvastatin  (CRESTOR ) 20 MG tablet TAKE 1 TABLET BY MOUTH ONCE DAILY 02/24/24   Cox, Kirsten, MD  sertraline  (ZOLOFT ) 100 MG tablet TAKE 1/2 TABLET(50 MG) BY MOUTH DAILY Patient taking differently: Take 50 mg by mouth daily. 08/24/23   CoxAbigail, MD  sulfamethoxazole -trimethoprim  (BACTRIM  DS) 800-160 MG tablet Take 1 tablet by mouth 2 (two) times daily. 02/05/24   Tina Pauletta BROCKS, MD  tiZANidine  (ZANAFLEX ) 4 MG tablet Take 1 tablet (4 mg total) by mouth at bedtime. Patient not taking: Reported on 07/07/2024 06/07/24   Pickenpack-Cousar, Athena N, NP  topiramate  (TOPAMAX ) 50 MG tablet TAKE TWO (2) TABLETS BY MOUTH EVERY DAY AT BEDTIME Patient not taking:  Reported on 07/07/2024 11/26/23   Teressa Harrie HERO, FNP  Ubrogepant  (UBRELVY ) 100 MG TABS Take 1 tablet (100 mg total) by mouth as needed (May repeat after 2 hours.  Maximum 2 tablets in 24 hours). TAKE 1 TABLET BY MOUTH BY MOUTH AS NEEDED( MAY REPEAT 1 TABLET AFTER 2 HOURS IF NEEDED, MAXIMUM 2 TABLETS IN 24 HOURS) Strength: 100 mg Patient not taking: Reported on 07/07/2024 04/07/23   Cox, Abigail, MD  Vitamin D , Ergocalciferol , (DRISDOL ) 1.25 MG (50000 UNIT) CAPS capsule TAKE 1 CAPSULE BY MOUTH EVERY 7 DAYS FOR 12 DOSES Patient taking differently: Take 50,000 Units by mouth every 7 (seven) days. Patient usually takes this medication on mondays 06/30/23   CoxAbigail, MD    Allergies: Lyrica  [pregabalin ]    Review of Systems  Constitutional:  Negative for chills and fever.  HENT:  Negative for ear pain and sore throat.   Eyes:  Negative for pain and visual disturbance.  Respiratory:  Negative for cough and shortness of breath.   Cardiovascular:  Negative for chest pain and palpitations.  Gastrointestinal:  Negative for abdominal pain and vomiting.  Genitourinary:  Negative for dysuria and hematuria.  Musculoskeletal:  Negative for arthralgias and back pain.  Skin:  Negative for color change and rash.  Neurological:  Negative for seizures and syncope.  All other systems reviewed and are negative.   Updated Vital Signs BP 122/72 (BP Location: Right Arm)   Pulse 91   Temp (!) 97.4 F (36.3 C) (Oral)   Resp 14   SpO2 100%   Physical Exam Vitals and nursing note reviewed.  Constitutional:      General: She is not in acute distress.    Appearance: She is well-developed.  HENT:     Head: Normocephalic and atraumatic.  Eyes:     Conjunctiva/sclera: Conjunctivae normal.  Cardiovascular:     Rate and Rhythm: Normal rate and regular rhythm.     Heart sounds: No murmur heard. Pulmonary:     Effort: Pulmonary effort is normal. No respiratory distress.     Breath sounds: Normal breath  sounds.  Abdominal:     Palpations: Abdomen is soft.     Tenderness: There is no abdominal tenderness.  Musculoskeletal:  General: No swelling.     Cervical back: Neck supple.  Skin:    General: Skin is warm and dry.     Capillary Refill: Capillary refill takes less than 2 seconds.  Neurological:     Mental Status: She is alert.  Psychiatric:        Mood and Affect: Mood normal.     (all labs ordered are listed, but only abnormal results are displayed) Labs Reviewed  CBC WITH DIFFERENTIAL/PLATELET - Abnormal; Notable for the following components:      Result Value   RBC 3.29 (*)    Hemoglobin 9.8 (*)    HCT 29.8 (*)    RDW 17.0 (*)    Neutro Abs 8.3 (*)    Basophils Absolute 0.2 (*)    Abs Immature Granulocytes 0.13 (*)    All other components within normal limits  COMPREHENSIVE METABOLIC PANEL WITH GFR - Abnormal; Notable for the following components:   Potassium 2.5 (*)    CO2 21 (*)    Glucose, Bld 108 (*)    Creatinine, Ser 1.16 (*)    Calcium  8.8 (*)    Total Protein 6.1 (*)    Albumin  3.2 (*)    GFR, Estimated 50 (*)    Anion gap 16 (*)    All other components within normal limits  URINALYSIS, ROUTINE W REFLEX MICROSCOPIC - Abnormal; Notable for the following components:   APPearance CLOUDY (*)    Hgb urine dipstick SMALL (*)    Protein, ur 100 (*)    Leukocytes,Ua LARGE (*)    Bacteria, UA FEW (*)    All other components within normal limits  MAGNESIUM  - Abnormal; Notable for the following components:   Magnesium  1.1 (*)    All other components within normal limits  URINE CULTURE  CULTURE, BLOOD (ROUTINE X 2)  CULTURE, BLOOD (ROUTINE X 2)  LIPASE, BLOOD  PROTIME-INR  APTT  OCCULT BLOOD X 1 CARD TO LAB, STOOL  POC OCCULT BLOOD, ED  TYPE AND SCREEN    EKG: EKG Interpretation Date/Time:  Thursday July 08 2024 11:12:37 EST Ventricular Rate:  92 PR Interval:  193 QRS Duration:  115 QT Interval:  403 QTC Calculation: 499 R  Axis:   -23  Text Interpretation: Sinus rhythm Multiform ventricular premature complexes Nonspecific intraventricular conduction delay Low voltage, precordial leads Borderline T abnormalities, lateral leads Confirmed by Simon Rea [phone]) on 07/08/2024 2:40:04 PM  Radiology: CT ABDOMEN PELVIS W CONTRAST Result Date: 07/08/2024 CLINICAL DATA:  Abdominal pain, nausea and vomiting. Bladder cancer. EXAM: CT ABDOMEN AND PELVIS WITH CONTRAST TECHNIQUE: Multidetector CT imaging of the abdomen and pelvis was performed using the standard protocol following bolus administration of intravenous contrast. RADIATION DOSE REDUCTION: This exam was performed according to the departmental dose-optimization program which includes automated exposure control, adjustment of the mA and/or kV according to patient size and/or use of iterative reconstruction technique. CONTRAST:  80mL OMNIPAQUE  IOHEXOL  300 MG/ML  SOLN COMPARISON:  Pelvic CT dated 11/17/2023. FINDINGS: Evaluation of this exam is limited due to respiratory motion and streak artifact caused by patient's arms and pacemaker. Lower chest: There is eventration of the right hemidiaphragm with right lung base atelectasis. Pneumonia is not excluded. Cardiac pacemaker noted. No intra-abdominal free air or free fluid. Hepatobiliary: The liver is unremarkable. There is mild dilatation, post cholecystectomy. No retained calcified stone noted in the central CBD. Pancreas: Unremarkable. No pancreatic ductal dilatation or surrounding inflammatory changes. Spleen: Normal in size without focal abnormality. Adrenals/Urinary  Tract: The adrenal glands unremarkable. Small bilateral renal cysts and subcentimeter hypodense lesions which are too small to characterize. There is a mild right hydronephrosis. Right pigtail ureteral stent with proximal tip in the proximal right ureter distal end in the urinary bladder. No hydronephrosis on the left. There is delayed excretion of contrast by the  right kidney. The urinary bladder is decompressed around a Foley catheter. Stomach/Bowel: There is diffuse thickened appearance of the colon consistent with pancolitis. There is no bowel obstruction. Appendectomy. Vascular/Lymphatic: Mild aortoiliac atherosclerotic disease. The IVC is unremarkable. No portal venous gas. No adenopathy. Reproductive: The uterus is poorly visualized and not evaluated. Other: None Musculoskeletal: Osteopenia with degenerative changes of spine. Total right hip arthroplasty with associated streak artifact limiting evaluation the pelvic structures. No acute osseous pathology. IMPRESSION: 1. Pancolitis. No bowel obstruction. 2. Mild right hydronephrosis. Right pigtail ureteral stent with proximal tip in the proximal right ureter. 3.  Aortic Atherosclerosis (ICD10-I70.0). Electronically Signed   By: Vanetta Chou M.D.   On: 07/08/2024 14:50     Procedures   Medications Ordered in the ED  potassium chloride  10 mEq in 100 mL IVPB (10 mEq Intravenous New Bag/Given 07/08/24 1613)  LORazepam  (ATIVAN ) injection 0.5 mg (0.5 mg Intravenous Given 07/08/24 1235)  sodium chloride  0.9 % bolus 1,000 mL (0 mLs Intravenous Stopped 07/08/24 1411)  magnesium  sulfate IVPB 2 g 50 mL (0 g Intravenous Stopped 07/08/24 1506)  potassium chloride  10 mEq in 100 mL IVPB (0 mEq Intravenous Stopped 07/08/24 1606)  iohexol  (OMNIPAQUE ) 300 MG/ML solution 80 mL (80 mLs Intravenous Contrast Given 07/08/24 1403)                                    Medical Decision Making Amount and/or Complexity of Data Reviewed Labs: ordered. Radiology: ordered.  Risk Prescription drug management.     HPI:   Patient presents because abdominal pain.  Nausea and vomiting.  Diarrhea.  Patient states that she started with vomiting yesterday.  States nothing out of control it.  Last night started noticing some bright red blood in her vomit as well.  Has been having diarrhea.  No dark stools.  No melena.  No  bright red blood per rectum.  Patient states he is never had a thing like this happen before.  Endorses some generalized abdominal pain but mostly epigastric in nature.  Denies any fever or chills.  No chest pain or shortness of breath no pleuritic chest pain or hemoptysis.  No sick contacts that she is aware of. Denies  a history of GI bleed     Previous medical history reviewed : Followed up with cardiology yesterday.complex past medical history significant for nonischemic cardiomyopathy initial ejection fraction 35% last echocardiogram showed normal 11 to ejection fraction, moderate mitral valve regurgitation, ICD which is BiV, essential hypertension, fibromyalgia, she was find to have bladder cancer, now she is in palliative care.    MDM:    Upon exam, patient hemodynamic stable.  ANO x 3 GCS 15.  Afebrile.  Maps appropriate.  No tachycardia or tachypnea.  O2 saturation 100% on room air.  pain to palpation epigastric region.  Some slight guarding.  No pain to palpation lower abdomen.  Rectal exam showed brown stool.  Will check stool guaiac test.  No melena that I can see.  Will obtain laboratory workup.  Will obtain CMP as well as CBC.  Assess for  any kind anemia or leukocytosis.  Assess for any large electrolyte durations.    Reevaluation:   Upon reexamination, patient hemodynamically stable.  Remains A&O x 3 with GCS 15.  Potassium 2.5.  Ordered 10 mill equivalents x 2 for replacement.  Magnesium  1.1.  Ordered 2 g IV magnesium  as well.  Likely because of GI losses due to nausea and vomiting.  CT scan showed pancolitis.  Currently, no concerns for any kind of ischemic disease.  Mostly benign abdomen.  Anion gap is only 16.  Will continue to watch.  Nonbloody stool on exam.  If she has any increasing bright red blood per rectum then this would be something to consider.   UA shows few bacteria with white blood cells and leuk esterase.  Asymptomatic from the standpoint.  Will hold on  antibiotics at this time.  Added on a urine culture.  Medicine team will follow.  Admitted for further care.  Interventions: mag, K   EKG Interpreted by Me: sinus    Cardiac Tele Interpreted by Me: sinus    I have independently interpreted the  CT  images and agree with the radiologist finding   Social Determinant of Health: quit smoking 4 years ago. 15 pack year smokign history.    Disposition and Follow Up: admit        Final diagnoses:  Pancolitis  Hypokalemia  Hypomagnesemia    ED Discharge Orders     None          Simon Lavonia SAILOR, MD 07/08/24 [phone]

## 2024-07-08 NOTE — Consult Note (Signed)
 Urology Consult   Physician requesting consult: Will Bernard, MD   Reason for consult: right ureteral stent in place with mild hydronephrosis   History of Present Illness: [name] is a 68 y.o. female with PMH nonischemic cardiomyopathy EF 35%, AICD, HTN, COPD, and metastatic UCC with squamous differentiation undergoing chemotherapy (EV-pembro) and indwelling right ureteral stent placed in April 2025 for hydro 2/2 invasive bladder mass. She presented to [facility] ED with epigastric pain, nausea, emesis, and diarrhea. CT A/P revealed pan-colitis but no obstruction. Urology was consulted after CT also showed the proximal curl of her right ureteral stent to be in the proximal ureter with mild hydronephrosis.  Pt afebrile, tachycardic to 120s on arrival, normotensive.    Labs are notable for Cr at baseline 1.16. K 2.5. Mag 1.1. No leukocytosis. UA drawn from chronic indwelling foley with large leukocytes, >50 WBC, 6-10 RBC, few bacteria. Pt denies any flank pain and reports pain is epigastric.    Past Medical History:  Diagnosis Date   Abnormality of gait due to impairment of balance 11/22/2020   Admission for long-term opiate analgesic use 10/24/2019   AKI (acute kidney injury) 11/14/2023   Anemia of chronic disease 06/07/2023   Arthralgia of both hands 04/07/2023   Arthritis of right hip 05/29/2016   Formatting of this note might be different from the original. Added automatically from request for surgery [id number]   Arthritis of right knee 11/27/2016   At risk for side effect of medication 12/18/2023   Bilateral lower extremity edema 06/07/2023   BMI 26.0-26.9,adult 03/29/2020   Bone lesion 11/18/2023   Cardiomyopathy (HCC)    Overview:  Ejection fraction 45% in 2015 Ejection fraction 30 to 35% in November 2018   Chronic active hepatitis (HCC) 01/04/2023   Chronic back pain    Chronic hypoxemic respiratory failure (HCC) 10/24/2019   Chronic narcotic use 03/23/2015   Chronic pain of  both knees 12/21/2018   Added automatically from request for surgery [id number]  Formatting of this note might be different from the original. 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   Dyslipidemia 05/31/2015   Elevated serum glucose 06/23/2023   Fatigue 06/23/2023   Folate deficiency 01/08/2024   GERD (gastroesophageal reflux disease)    History of total hip replacement 09/25/2016   Hypoaldosteronism 07/13/2020   Hypotension 05/17/2020   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   Ileus following gastrointestinal surgery (HCC) 03/26/2015   Iron deficiency 11/21/2023   Low vitamin B12 level 11/21/2023   Major depressive disorder, single episode, moderate (HCC) 10/24/2019   Malnutrition of moderate degree 10/20/2020   Mesenteric ischemia 10/16/2020   Migraine without aura and without status migrainosus, not intractable 01/04/2023  Mixed hyperlipidemia 05/31/2015   Mixed incontinence 10/20/2020   Moderate protein-calorie malnutrition 11/03/2023   Nausea with vomiting    Nausea without vomiting 01/29/2024    Nephrostomy present (HCC) 12/01/2023   Neuropathy 01/29/2024   Normocytic anemia 06/23/2023   Osteoporosis 10/24/2019   Other spondylosis with radiculopathy, lumbar region 10/24/2019   Paraplegia (HCC) 11/14/2023   Presence of left artificial hip joint 10/24/2019   Primary insomnia 01/04/2023   PTSD (post-traumatic stress disorder)    Renal lesion 11/05/2023   Rheumatoid arthritis involving multiple sites (HCC) 04/07/2023   Senile osteoporosis 10/24/2019   Serosanguineous chronic otitis media of right ear 08/02/2021   Therapeutic opioid induced constipation 01/04/2023   Thyroid  disease    Urge incontinence of urine 11/03/2023   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 Small internal hemrrhoids. Otherwise normal colonoscopy   COLONOSCOPY WITH PROPOFOL  N/A 09/27/2021   Procedure: COLONOSCOPY WITH PROPOFOL ;  Surgeon: Shila Gustav GAILS, MD;  Location: [facility];  Service: Endoscopy;  Laterality: N/A;   CORONARY ANGIOPLASTY     ESOPHAGOGASTRODUODENOSCOPY  02/04/2014   Mild gastritis. Otherwise normal EGD   HAND SURGERY Bilateral    ICD GENERATOR CHANGEOUT N/A 05/21/2019   Procedure: ICD GENERATOR CHANGEOUT;  Surgeon: Inocencio Soyla Lunger, MD;  Location: [facility] INVASIVE CV LAB;  Service: Cardiovascular;  Laterality: N/A;   ICD IMPLANT     [employer]   intestinal blockage 2011     IR IMAGING GUIDED PORT INSERTION  12/24/2023   IR NEPHRO TUBE REMOV/FL  06/02/2024   IR NEPHROSTOMY PLACEMENT RIGHT  11/16/2023   IR URETERAL STENT PLACEMENT EXISTING ACCESS RIGHT  12/24/2023   LAPAROSCOPIC ASSISTED VENTRAL HERNIA REPAIR  N/A 03/23/2015   Procedure: LAPAROSCOPIC VENTRAL WALL HERNIA REPAIR;  Surgeon: Elspeth Schultze, MD;  Location: [facility];  Service: General;  Laterality: N/A;  With MESH   LAPAROSCOPIC LYSIS OF ADHESIONS N/A 03/23/2015   Procedure: LAPAROSCOPIC LYSIS OF ADHESIONS;  Surgeon: Elspeth Schultze, MD;  Location: [facility];  Service: General;  Laterality: N/A;   LSCS      x2   NASAL SEPTUM SURGERY     NECK SURGERY     fused   TONSILLECTOMY     TRANSURETHRAL RESECTION OF BLADDER TUMOR N/A 11/16/2023   Procedure: TURBT (TRANSURETHRAL RESECTION OF BLADDER TUMOR);  Surgeon: [name] Lonni Righter, MD;  Location: [facility];  Service: Urology;  Laterality: N/A;   TYMPANOSTOMY TUBE PLACEMENT Right 2024   ULNAR NERVE TRANSPOSITION  01/23/2012   Procedure: ULNAR NERVE DECOMPRESSION/TRANSPOSITION;  Surgeon: Reyes JONETTA Budge, MD;  Location: [facility];  Service: Neurosurgery;  Laterality: Left;  LEFT ulnar nerve decompression     Current Hospital Medications:  Home meds:  No current facility-administered medications on file prior to encounter.   Current Outpatient Medications on File Prior to Encounter  Medication Sig Dispense Refill   esomeprazole  (NEXIUM ) 20 MG capsule TAKE ONE (1) CAPSULE BY MOUTH ONCE DAILY 90 capsule 3   methadone  (DOLOPHINE ) 10 MG tablet Take 1 tablet (10 mg total) by mouth every 8 (eight) hours. (Patient taking differently:  Take 10 mg by mouth every 8 (eight) hours as needed (for pain).) 75 tablet 0   ondansetron  (ZOFRAN ) 8 MG tablet Take 1 tablet (8 mg total) by mouth every 8 (eight) hours as needed for nausea or vomiting. 30 tablet 1   Oxycodone  HCl 20 MG TABS Take 1 tablet (20 mg total) by mouth every 4 (four) hours as needed. (Patient taking differently: Take 20 mg by mouth every 4 (four) hours as needed (for pain).) 90 tablet 0   ARIPiprazole  (ABILIFY ) 5 MG tablet TAKE 1 TABLET BY MOUTH DAILY 30 tablet 10   aspirin  81 MG tablet Take 81 mg by mouth daily.     BREO ELLIPTA  100-25  MCG/ACT AEPB INHALE ONE (1) PUFF BY MOUTH DAILY 60 each 11   Carboxymethylcell-Glycerin  PF 0.5-0.9 % SOLN Place 2 drops into both eyes 4 (four) times daily. (Patient not taking: Reported on 07/07/2024) 1 each 11   carvedilol  (COREG ) 3.125 MG tablet TAKE ONE (1) TABLET BY MOUTH TWICE DAILY WITH MEALS (Patient taking differently: Take 3.125 mg by mouth 2 (two) times daily with a meal.) 60 tablet 10   denosumab  (PROLIA ) 60 MG/ML SOSY injection Inject 60 mg into the skin every 6 (six) months. 180 mL 2   dextromethorphan  15 MG/5ML syrup Take 10 mLs (30 mg total) by mouth 4 (four) times daily as needed for cough. 120 mL 0   diclofenac  Sodium (VOLTAREN ) 1 % GEL Apply 2 g topically 4 (four) times daily. 50 g 4   diphenoxylate -atropine  (LOMOTIL ) 2.5-0.025 MG tablet Take 1 tablet by mouth 4 (four) times daily as needed for diarrhea or loose stools. 60 tablet 0   dronabinol  (MARINOL ) 5 MG capsule Take 1 capsule (5 mg total) by mouth 2 (two) times daily before lunch and supper. (Patient not taking: No sig reported) 60 capsule 0   ENTRESTO  24-26 MG TAKE ONE (1) TABLET BY MOUTH TWICE DAILY 60 tablet 10   EPINEPHRINE  0.3 mg/0.3 mL IJ SOAJ injection Inject 0.3 mLs (0.3 mg total) into the muscle as needed for anaphylaxis. (Patient not taking: Reported on 07/07/2024) 1 each 2   FARXIGA 5 MG TABS tablet Take 5 mg by mouth daily.     ferrous sulfate  325 (65 FE) MG tablet Take 1 tablet (325 mg total) by mouth at bedtime.     fluticasone  (FLONASE ) 50 MCG/ACT nasal spray USE 2 SPRAYS IN EACH NOSTRILS DAILY AS NEEDED (Patient not taking: Reported on 07/07/2024) 48 g 0   folic acid  (FOLVITE ) 1 MG tablet Take 1 tablet (1 mg total) by mouth daily. (Patient not taking: Reported on 07/07/2024)     furosemide  (LASIX ) 20 MG tablet Take 3 tablets (60 mg total) by mouth daily. 270 tablet 1   ipratropium-albuterol  (DUONEB) 0.5-2.5 (3) MG/3ML SOLN USE 1 VIAL VIA NEBULIZER EVERY 6 HOURS AS NEEDED FOR SHORTNESS OF BREATH (Patient taking  differently: Take 3 mLs by nebulization every 6 (six) hours as needed (SOB).) 90 mL 0   levocetirizine (XYZAL) 5 MG tablet Take 5 mg by mouth daily as needed for allergies.     lidocaine  (LIDODERM ) 5 % Place 1 patch onto the skin daily. Remove & Discard patch within 12 hours or as directed by MD (Patient not taking: Reported on 07/07/2024)     lidocaine -prilocaine  (EMLA ) cream Apply to affected area once (Patient not taking: Reported on 07/07/2024) 30 g 3   lubiprostone  (AMITIZA ) 24 MCG capsule TAKE 1 CAPSULE(24 MCG) BY MOUTH TWICE DAILY WITH A MEAL (Patient  taking differently: Take 24 mcg by mouth 2 (two) times daily with a meal.) 180 capsule 1   methadone  (DOLOPHINE ) 5 MG tablet Take 1 tablet (5 mg total) by mouth every 8 (eight) hours. (Take in addition to 10mg  (Total 15mg )). 30 tablet 0   methylPREDNISolone  (MEDROL  DOSEPAK) 4 MG TBPK tablet If significant rash, will start at 6 tabs on day 1, then 5 tabs on day 2, then 4 tabs on day 3, then 3 tabs on day 4, then 2 tabs on day 5 and then 1 tab on day 6. Take with food. 21 tablet 0   metoCLOPramide  (REGLAN ) 10 MG tablet Take 1 tablet (10 mg total) by mouth every 8 (eight) hours as needed for nausea. 45 tablet 1   Multiple Vitamin (MULTIVITAMIN WITH MINERALS) TABS tablet Take 1 tablet by mouth daily.     naloxone  (NARCAN ) nasal spray 4 mg/0.1 mL Place 1 spray into the nose once.     nitroGLYCERIN  (NITROSTAT ) 0.4 MG SL tablet Place 1 tablet (0.4 mg total) under the tongue every 5 (five) minutes as needed for chest pain. 25 tablet 6   ondansetron  (ZOFRAN ) 4 MG tablet Take 1 tablet (4 mg total) by mouth every 8 (eight) hours as needed for nausea or vomiting. (Patient not taking: Reported on 07/08/2024) 20 tablet 0   potassium chloride  (KLOR-CON  M) 10 MEQ tablet Take 2 tablets by mouth twice daily for 3 days, then 1 tablet once daily 90 tablet 0   prochlorperazine  (COMPAZINE ) 10 MG tablet TAKE 1 TABLET(10 MG) BY MOUTH EVERY 6 HOURS AS NEEDED FOR NAUSEA OR  VOMITING 30 tablet 3   ranolazine  (RANEXA ) 500 MG 12 hr tablet TAKE 1 TABLET BY MOUTH TWICE DAILY 60 tablet 10   rosuvastatin  (CRESTOR ) 20 MG tablet TAKE 1 TABLET BY MOUTH ONCE DAILY 90 tablet 1   sertraline  (ZOLOFT ) 100 MG tablet TAKE 1/2 TABLET(50 MG) BY MOUTH DAILY (Patient taking differently: Take 50 mg by mouth daily.) 45 tablet 1   sulfamethoxazole -trimethoprim  (BACTRIM  DS) 800-160 MG tablet Take 1 tablet by mouth 2 (two) times daily. 14 tablet 0   tiZANidine  (ZANAFLEX ) 4 MG tablet Take 1 tablet (4 mg total) by mouth at bedtime. (Patient not taking: Reported on 07/07/2024) 30 tablet 3   topiramate  (TOPAMAX ) 50 MG tablet TAKE TWO (2) TABLETS BY MOUTH EVERY DAY AT BEDTIME (Patient not taking: Reported on 07/07/2024) 60 tablet 10   Ubrogepant  (UBRELVY ) 100 MG TABS Take 1 tablet (100 mg total) by mouth as needed (May repeat after 2 hours.  Maximum 2 tablets in 24 hours). TAKE 1 TABLET BY MOUTH BY MOUTH AS NEEDED( MAY REPEAT 1 TABLET AFTER 2 HOURS IF NEEDED, MAXIMUM 2 TABLETS IN 24 HOURS) Strength: 100 mg (Patient not taking: Reported on 07/07/2024) 48 tablet 1   Vitamin D , Ergocalciferol , (DRISDOL ) 1.25 MG (50000 UNIT) CAPS capsule TAKE 1 CAPSULE BY MOUTH EVERY 7 DAYS FOR 12 DOSES (Patient taking differently: Take 50,000 Units by mouth every 7 (seven) days. Patient usually takes this medication on mondays) 12 capsule 0     Scheduled Meds:  [START ON 07/09/2024] carvedilol   3.125 mg Oral BID WC   methadone   5 mg Oral Q8H   [START ON 07/09/2024] sertraline   50 mg Oral Daily   Continuous Infusions:  cefTRIAXone  (ROCEPHIN )  IV 2 g (07/08/24 1952)   metronidazole  500 mg (07/08/24 2038)   PRN Meds:.acetaminophen  **OR** acetaminophen , ondansetron  (ZOFRAN ) IV, prochlorperazine   Allergies:  Allergies  Allergen Reactions   Lyrica  [  Pregabalin ] Other (See Comments)    Jerking, twitching, memory changes     Family History  Problem Relation Age of Onset   Hypertension Mother    Heart attack  Mother    Alcohol abuse Father    Hypertension Father    Heart attack Father    Heart attack Other    Cancer Other    Heart failure Other    Anesthesia problems Neg Hx    Hypotension Neg Hx    Malignant hyperthermia Neg Hx    Pseudochol deficiency Neg Hx    Breast cancer Neg Hx     Social History:  reports that she quit smoking about 4 years ago. Her smoking use included cigarettes. She started smoking about 34 years ago. She has a 15 pack-year smoking history. She has never used smokeless tobacco. She reports current alcohol use of about 2.0 standard drinks of alcohol per week. She reports that she does not use drugs.  ROS: A complete review of systems was performed.  All systems are negative except for pertinent findings as noted.  Physical Exam:  Vital signs in last 24 hours: Temp:  [97.4 F (36.3 C)-98 F (36.7 C)] 98 F (36.7 C) (11/13 2210) Pulse Rate:  [80-133] 133 (11/13 2200) Resp:  [14-22] 20 (11/13 2145) BP: (106-136)/(72-93) 116/91 (11/13 2200) SpO2:  [100 %] 100 % (11/13 2200) Weight:  [54.4 kg] 54.4 kg (11/13 1719) Constitutional:  Alert and oriented, ill-appearing  Cardiovascular: Regular rate and rhythm Respiratory: Normal respiratory effort, Lungs clear bilaterally GI: Abdomen is soft, epigastric TTP, nondistended GU: No CVA tenderness Neurologic: Grossly intact  Laboratory Data:  Recent Labs    07/08/24 1117  WBC 10.5  HGB 9.8*  HCT 29.8*  PLT 240    Recent Labs    07/08/24 1117  NA 143  K 2.5*  CL 107  GLUCOSE 108*  BUN 16  CALCIUM  8.8*  CREATININE 1.16*     Results for orders placed or performed during the hospital encounter of 07/08/24 (from the past 24 hours)  Urinalysis, Routine w reflex microscopic -Urine, Clean Catch     Status: Abnormal   Collection Time: 07/08/24 11:13 AM  Result Value Ref Range   Color, Urine YELLOW YELLOW   APPearance CLOUDY (A) CLEAR   Specific Gravity, Urine 1.012 1.005 - 1.030   pH 8.0 5.0 - 8.0    Glucose, UA NEGATIVE NEGATIVE mg/dL   Hgb urine dipstick SMALL (A) NEGATIVE   Bilirubin Urine NEGATIVE NEGATIVE   Ketones, ur NEGATIVE NEGATIVE mg/dL   Protein, ur 899 (A) NEGATIVE mg/dL   Nitrite NEGATIVE NEGATIVE   Leukocytes,Ua LARGE (A) NEGATIVE   RBC / HPF 6-10 0 - 5 RBC/hpf   WBC, UA >50 0 - 5 WBC/hpf   Bacteria, UA FEW (A) NONE SEEN   Squamous Epithelial / HPF 0-5 0 - 5 /HPF   WBC Clumps PRESENT    Mucus PRESENT   CBC with Differential     Status: Abnormal   Collection Time: 07/08/24 11:17 AM  Result Value Ref Range   WBC 10.5 4.0 - 10.5 K/uL   RBC 3.29 (L) 3.87 - 5.11 MIL/uL   Hemoglobin 9.8 (L) 12.0 - 15.0 g/dL   HCT 70.1 (L) 63.9 - 53.9 %   MCV 90.6 80.0 - 100.0 fL   MCH 29.8 26.0 - 34.0 pg   MCHC 32.9 30.0 - 36.0 g/dL   RDW 82.9 (H) 88.4 - 84.4 %   Platelets 240 150 -  400 K/uL   nRBC 0.0 0.0 - 0.2 %   Neutrophils Relative % 79 %   Neutro Abs 8.3 (H) 1.7 - 7.7 K/uL   Lymphocytes Relative 9 %   Lymphs Abs 1.0 0.7 - 4.0 K/uL   Monocytes Relative 5 %   Monocytes Absolute 0.5 0.1 - 1.0 K/uL   Eosinophils Relative 4 %   Eosinophils Absolute 0.4 0.0 - 0.5 K/uL   Basophils Relative 2 %   Basophils Absolute 0.2 (H) 0.0 - 0.1 K/uL   Immature Granulocytes 1 %   Abs Immature Granulocytes 0.13 (H) 0.00 - 0.07 K/uL  Comprehensive metabolic panel     Status: Abnormal   Collection Time: 07/08/24 11:17 AM  Result Value Ref Range   Sodium 143 135 - 145 mmol/L   Potassium 2.5 (LL) 3.5 - 5.1 mmol/L   Chloride 107 98 - 111 mmol/L   CO2 21 (L) 22 - 32 mmol/L   Glucose, Bld 108 (H) 70 - 99 mg/dL   BUN 16 8 - 23 mg/dL   Creatinine, Ser 8.83 (H) 0.44 - 1.00 mg/dL   Calcium  8.8 (L) 8.9 - 10.3 mg/dL   Total Protein 6.1 (L) 6.5 - 8.1 g/dL   Albumin  3.2 (L) 3.5 - 5.0 g/dL   AST 15 15 - 41 U/L   ALT <5 0 - 44 U/L   Alkaline Phosphatase 53 38 - 126 U/L   Total Bilirubin 0.3 0.0 - 1.2 mg/dL   GFR, Estimated 50 (L) >60 mL/min   Anion gap 16 (H) 5 - 15  Lipase, blood     Status:  None   Collection Time: 07/08/24 11:17 AM  Result Value Ref Range   Lipase 15 11 - 51 U/L  Protime-INR     Status: None   Collection Time: 07/08/24 11:17 AM  Result Value Ref Range   Prothrombin Time 14.3 11.4 - 15.2 seconds   INR 1.1 0.8 - 1.2  APTT     Status: None   Collection Time: 07/08/24 11:17 AM  Result Value Ref Range   aPTT 30 24 - 36 seconds  Magnesium      Status: Abnormal   Collection Time: 07/08/24 11:17 AM  Result Value Ref Range   Magnesium  1.1 (L) 1.7 - 2.4 mg/dL  Type and screen [facility]     Status: None   Collection Time: 07/08/24 11:17 AM  Result Value Ref Range   ABO/RH(D) O NEG    Antibody Screen NEG    Sample Expiration      07/11/2024,2359 Performed at [facility], [address] [place], [place] [address]   POC occult blood, ED     Status: None   Collection Time: 07/08/24 11:20 AM  Result Value Ref Range   Fecal Occult Bld NEGATIVE NEGATIVE   *Note: Due to a large number of results and/or encounters for the requested time period, some results have not been displayed. A complete set of results can be found in Results Review.   No results found for this or any previous visit (from the past 240 hours).  Renal Function: Recent Labs    07/08/24 1117  CREATININE 1.16*   Estimated Creatinine Clearance: 32.4 mL/min (A) (by C-G formula based on SCr of 1.16 mg/dL (H)).  Radiologic Imaging: CT ABDOMEN PELVIS W CONTRAST Result Date: 07/08/2024 CLINICAL DATA:  Abdominal pain, nausea and vomiting. Bladder cancer. EXAM: CT ABDOMEN AND PELVIS WITH CONTRAST TECHNIQUE: Multidetector CT imaging of the abdomen and  pelvis was performed using the standard protocol following bolus administration of intravenous contrast. RADIATION DOSE REDUCTION: This exam was performed according to the departmental dose-optimization program which includes automated exposure control, adjustment of the mA and/or kV according to patient size  and/or use of iterative reconstruction technique. CONTRAST:  80mL OMNIPAQUE  IOHEXOL  300 MG/ML  SOLN COMPARISON:  Pelvic CT dated 11/17/2023. FINDINGS: Evaluation of this exam is limited due to respiratory motion and streak artifact caused by patient's arms and pacemaker. Lower chest: There is eventration of the right hemidiaphragm with right lung base atelectasis. Pneumonia is not excluded. Cardiac pacemaker noted. No intra-abdominal free air or free fluid. Hepatobiliary: The liver is unremarkable. There is mild dilatation, post cholecystectomy. No retained calcified stone noted in the central CBD. Pancreas: Unremarkable. No pancreatic ductal dilatation or surrounding inflammatory changes. Spleen: Normal in size without focal abnormality. Adrenals/Urinary Tract: The adrenal glands unremarkable. Small bilateral renal cysts and subcentimeter hypodense lesions which are too small to characterize. There is a mild right hydronephrosis. Right pigtail ureteral stent with proximal tip in the proximal right ureter distal end in the urinary bladder. No hydronephrosis on the left. There is delayed excretion of contrast by the right kidney. The urinary bladder is decompressed around a Foley catheter. Stomach/Bowel: There is diffuse thickened appearance of the colon consistent with pancolitis. There is no bowel obstruction. Appendectomy. Vascular/Lymphatic: Mild aortoiliac atherosclerotic disease. The IVC is unremarkable. No portal venous gas. No adenopathy. Reproductive: The uterus is poorly visualized and not evaluated. Other: None Musculoskeletal: Osteopenia with degenerative changes of spine. Total right hip arthroplasty with associated streak artifact limiting evaluation the pelvic structures. No acute osseous pathology. IMPRESSION: 1. Pancolitis. No bowel obstruction. 2. Mild right hydronephrosis. Right pigtail ureteral stent with proximal tip in the proximal right ureter. 3.  Aortic Atherosclerosis (ICD10-I70.0).  Electronically Signed   By: Vanetta Chou M.D.   On: 07/08/2024 14:50    I independently reviewed the above imaging studies.  Impression/Recommendation: Pt is a 68 yo female with PMH nonischemic cardiomyopathy EF 35%, AICD, HTN, COPD, and metastatic UCC with squamous differentiation undergoing chemotherapy (EV-pembro) and indwelling right ureteral stent placed in April 2025 for hydro 2/2 invasive bladder mass.   Patient's current presentation is most consistent with GI etiology, and her CT reveals likely pan-colitis. She may benefit from exchange of her indwelling ureteral stent, but given stable renal function, lack of flank pain or new urinary symptoms, and UA likely reflective of colonization of chronic foley, I do not believe her stent requires urgent replacement. Further, she is a poor anesthesia candidate,

## 2024-07-09 ENCOUNTER — Inpatient Hospital Stay

## 2024-07-09 ENCOUNTER — Encounter (HOSPITAL_COMMUNITY): Payer: Self-pay | Admitting: Pulmonary Disease

## 2024-07-09 ENCOUNTER — Inpatient Hospital Stay (HOSPITAL_COMMUNITY)

## 2024-07-09 ENCOUNTER — Other Ambulatory Visit: Payer: Self-pay

## 2024-07-09 DIAGNOSIS — R579 Shock, unspecified: Secondary | ICD-10-CM | POA: Diagnosis not present

## 2024-07-09 DIAGNOSIS — Z66 Do not resuscitate: Secondary | ICD-10-CM

## 2024-07-09 DIAGNOSIS — I5181 Takotsubo syndrome: Secondary | ICD-10-CM

## 2024-07-09 DIAGNOSIS — I5023 Acute on chronic systolic (congestive) heart failure: Secondary | ICD-10-CM | POA: Diagnosis not present

## 2024-07-09 DIAGNOSIS — K922 Gastrointestinal hemorrhage, unspecified: Secondary | ICD-10-CM | POA: Diagnosis present

## 2024-07-09 DIAGNOSIS — R570 Cardiogenic shock: Secondary | ICD-10-CM | POA: Insufficient documentation

## 2024-07-09 DIAGNOSIS — K920 Hematemesis: Secondary | ICD-10-CM | POA: Diagnosis not present

## 2024-07-09 DIAGNOSIS — Z95810 Presence of automatic (implantable) cardiac defibrillator: Secondary | ICD-10-CM

## 2024-07-09 DIAGNOSIS — R6521 Severe sepsis with septic shock: Secondary | ICD-10-CM | POA: Diagnosis not present

## 2024-07-09 DIAGNOSIS — Z8551 Personal history of malignant neoplasm of bladder: Secondary | ICD-10-CM

## 2024-07-09 DIAGNOSIS — I5043 Acute on chronic combined systolic (congestive) and diastolic (congestive) heart failure: Secondary | ICD-10-CM

## 2024-07-09 DIAGNOSIS — J9601 Acute respiratory failure with hypoxia: Secondary | ICD-10-CM

## 2024-07-09 DIAGNOSIS — R739 Hyperglycemia, unspecified: Secondary | ICD-10-CM

## 2024-07-09 DIAGNOSIS — A419 Sepsis, unspecified organism: Secondary | ICD-10-CM | POA: Diagnosis not present

## 2024-07-09 DIAGNOSIS — I5021 Acute systolic (congestive) heart failure: Secondary | ICD-10-CM

## 2024-07-09 DIAGNOSIS — E43 Unspecified severe protein-calorie malnutrition: Secondary | ICD-10-CM | POA: Insufficient documentation

## 2024-07-09 DIAGNOSIS — G9341 Metabolic encephalopathy: Secondary | ICD-10-CM

## 2024-07-09 LAB — BLOOD GAS, ARTERIAL
Acid-base deficit: 16.3 mmol/L — ABNORMAL HIGH (ref 0.0–2.0)
Acid-base deficit: 9.5 mmol/L — ABNORMAL HIGH (ref 0.0–2.0)
Bicarbonate: 10.9 mmol/L — ABNORMAL LOW (ref 20.0–28.0)
Bicarbonate: 13.6 mmol/L — ABNORMAL LOW (ref 20.0–28.0)
Drawn by: 20012
Drawn by: 422461
FIO2: 50 %
MECHVT: 360 mL
O2 Content: 2 L/min
O2 Content: 50 L/min
O2 Saturation: 96.1 %
O2 Saturation: 97.1 %
PEEP: 5 cmH2O
Patient temperature: 36.3
Patient temperature: 36.7
RATE: 18 {breaths}/min
pCO2 arterial: 23 mmHg — ABNORMAL LOW (ref 32–48)
pCO2 arterial: 29 mmHg — ABNORMAL LOW (ref 32–48)
pH, Arterial: 7.18 — CL (ref 7.35–7.45)
pH, Arterial: 7.38 (ref 7.35–7.45)
pO2, Arterial: 97 mmHg (ref 83–108)
pO2, Arterial: 99 mmHg (ref 83–108)

## 2024-07-09 LAB — POCT I-STAT 7, (LYTES, BLD GAS, ICA,H+H)
Acid-base deficit: 7 mmol/L — ABNORMAL HIGH (ref 0.0–2.0)
Bicarbonate: 16.9 mmol/L — ABNORMAL LOW (ref 20.0–28.0)
Calcium, Ion: 1.21 mmol/L (ref 1.15–1.40)
HCT: 24 % — ABNORMAL LOW (ref 36.0–46.0)
Hemoglobin: 8.2 g/dL — ABNORMAL LOW (ref 12.0–15.0)
O2 Saturation: 100 %
Potassium: 3.5 mmol/L (ref 3.5–5.1)
Sodium: 144 mmol/L (ref 135–145)
TCO2: 18 mmol/L — ABNORMAL LOW (ref 22–32)
pCO2 arterial: 27.1 mmHg — ABNORMAL LOW (ref 32–48)
pH, Arterial: 7.402 (ref 7.35–7.45)
pO2, Arterial: 362 mmHg — ABNORMAL HIGH (ref 83–108)

## 2024-07-09 LAB — POCT I-STAT, CHEM 8
BUN: 13 mg/dL (ref 8–23)
Calcium, Ion: 1.21 mmol/L (ref 1.15–1.40)
Chloride: 106 mmol/L (ref 98–111)
Creatinine, Ser: 1.3 mg/dL — ABNORMAL HIGH (ref 0.44–1.00)
Glucose, Bld: 237 mg/dL — ABNORMAL HIGH (ref 70–99)
HCT: 35 % — ABNORMAL LOW (ref 36.0–46.0)
Hemoglobin: 11.9 g/dL — ABNORMAL LOW (ref 12.0–15.0)
Potassium: 3.5 mmol/L (ref 3.5–5.1)
Sodium: 145 mmol/L (ref 135–145)
TCO2: 19 mmol/L — ABNORMAL LOW (ref 22–32)

## 2024-07-09 LAB — BASIC METABOLIC PANEL WITH GFR
Anion gap: 17 — ABNORMAL HIGH (ref 5–15)
BUN: 12 mg/dL (ref 8–23)
CO2: 16 mmol/L — ABNORMAL LOW (ref 22–32)
Calcium: 8.9 mg/dL (ref 8.9–10.3)
Chloride: 108 mmol/L (ref 98–111)
Creatinine, Ser: 1 mg/dL (ref 0.44–1.00)
GFR, Estimated: >60 mL/min (ref >60)
Glucose, Bld: 124 mg/dL — ABNORMAL HIGH (ref 70–99)
Potassium: 3.5 mmol/L (ref 3.5–5.1)
Sodium: 140 mmol/L (ref 135–145)

## 2024-07-09 LAB — URINE CULTURE

## 2024-07-09 LAB — GLUCOSE, CAPILLARY
Glucose-Capillary: 144 mg/dL — ABNORMAL HIGH (ref 70–99)
Glucose-Capillary: 146 mg/dL — ABNORMAL HIGH (ref 70–99)
Glucose-Capillary: 197 mg/dL — ABNORMAL HIGH (ref 70–99)
Glucose-Capillary: 267 mg/dL — ABNORMAL HIGH (ref 70–99)

## 2024-07-09 LAB — RESPIRATORY PANEL BY PCR

## 2024-07-09 LAB — HEMOGLOBIN AND HEMATOCRIT, BLOOD
HCT: 30.3 % — ABNORMAL LOW (ref 36.0–46.0)
Hemoglobin: 10 g/dL — ABNORMAL LOW (ref 12.0–15.0)

## 2024-07-09 LAB — LACTIC ACID, PLASMA
Lactic Acid, Venous: 5.6 mmol/L (ref 0.5–1.9)
Lactic Acid, Venous: >9 mmol/L (ref 0.5–1.9)
Lactic Acid, Venous: >9 mmol/L (ref 0.5–1.9)

## 2024-07-09 LAB — HEMOGLOBIN A1C
Hgb A1c MFr Bld: 5.3 % (ref 4.8–5.6)
Mean Plasma Glucose: 105.41 mg/dL

## 2024-07-09 LAB — COMPREHENSIVE METABOLIC PANEL WITH GFR
ALT: 64 U/L — ABNORMAL HIGH (ref 0–44)
ALT: 228 U/L — ABNORMAL HIGH (ref 0–44)
AST: 183 U/L — ABNORMAL HIGH (ref 15–41)
AST: 566 U/L — ABNORMAL HIGH (ref 15–41)
Albumin: 3.6 g/dL (ref 3.5–5.0)
Albumin: 2.9 g/dL — ABNORMAL LOW (ref 3.5–5.0)
Alkaline Phosphatase: 86 U/L (ref 38–126)
Alkaline Phosphatase: 100 U/L (ref 38–126)
Anion gap: 26 — ABNORMAL HIGH (ref 5–15)
Anion gap: 29 — ABNORMAL HIGH (ref 5–15)
BUN: 14 mg/dL (ref 8–23)
BUN: 15 mg/dL (ref 8–23)
CO2: 8 mmol/L — ABNORMAL LOW (ref 22–32)
CO2: 14 mmol/L — ABNORMAL LOW (ref 22–32)
Calcium: 8.8 mg/dL — ABNORMAL LOW (ref 8.9–10.3)
Calcium: 9.2 mg/dL (ref 8.9–10.3)
Chloride: 109 mmol/L (ref 98–111)
Chloride: 102 mmol/L (ref 98–111)
Creatinine, Ser: 1.25 mg/dL — ABNORMAL HIGH (ref 0.44–1.00)
Creatinine, Ser: 1.48 mg/dL — ABNORMAL HIGH (ref 0.44–1.00)
GFR, Estimated: 46 mL/min — ABNORMAL LOW (ref >60)
GFR, Estimated: 38 mL/min — ABNORMAL LOW (ref >60)
Glucose, Bld: 148 mg/dL — ABNORMAL HIGH (ref 70–99)
Glucose, Bld: 260 mg/dL — ABNORMAL HIGH (ref 70–99)
Potassium: 4.1 mmol/L (ref 3.5–5.1)
Potassium: 3.9 mmol/L (ref 3.5–5.1)
Sodium: 143 mmol/L (ref 135–145)
Sodium: 145 mmol/L (ref 135–145)
Total Bilirubin: 0.6 mg/dL (ref 0.0–1.2)
Total Bilirubin: 0.9 mg/dL (ref 0.0–1.2)
Total Protein: 6.6 g/dL (ref 6.5–8.1)
Total Protein: 5.5 g/dL — ABNORMAL LOW (ref 6.5–8.1)

## 2024-07-09 LAB — CG4 I-STAT (LACTIC ACID)
Lactic Acid, Venous: 8 mmol/L (ref 0.5–1.9)
Lactic Acid, Venous: 12 mmol/L (ref 0.5–1.9)

## 2024-07-09 LAB — DIC (DISSEMINATED INTRAVASCULAR COAGULATION)PANEL
D-Dimer, Quant: 2.82 ug{FEU}/mL — ABNORMAL HIGH (ref 0.00–0.50)
Fibrinogen: 378 mg/dL (ref 210–475)
INR: 1.6 — ABNORMAL HIGH (ref 0.8–1.2)
Platelets: 155 K/uL (ref 150–400)
Prothrombin Time: 20.1 s — ABNORMAL HIGH (ref 11.4–15.2)
Smear Review: NONE SEEN
aPTT: 35 s (ref 24–36)

## 2024-07-09 LAB — SARS CORONAVIRUS 2 BY RT PCR: SARS Coronavirus 2 by RT PCR: NEGATIVE

## 2024-07-09 LAB — COOXEMETRY PANEL
Carboxyhemoglobin: 0.4 % — ABNORMAL LOW (ref 0.5–1.5)
Carboxyhemoglobin: <0.3 % — ABNORMAL LOW (ref 0.5–1.5)
Methemoglobin: <0.7 % (ref 0.0–1.5)
Methemoglobin: <0.7 % (ref 0.0–1.5)
O2 Saturation: 73.3 %
O2 Saturation: 29.2 %
Total hemoglobin: 8.5 g/dL — ABNORMAL LOW (ref 12.0–16.0)
Total hemoglobin: 10.5 g/dL — ABNORMAL LOW (ref 12.0–16.0)

## 2024-07-09 LAB — ECHOCARDIOGRAM COMPLETE
AR max vel: 2 cm2
AV Area VTI: 1.98 cm2
AV Area mean vel: 1.84 cm2
AV Mean grad: 1 mmHg
AV Peak grad: 2.5 mmHg
Ao pk vel: 0.79 m/s
Area-P 1/2: 3.86 cm2
Est EF: <20
Height: 60 in
S' Lateral: 5.7 cm
Weight: 1920.01 [oz_av]

## 2024-07-09 LAB — CBC
HCT: 36.3 % (ref 36.0–46.0)
HCT: 33.6 % — ABNORMAL LOW (ref 36.0–46.0)
Hemoglobin: 11.9 g/dL — ABNORMAL LOW (ref 12.0–15.0)
Hemoglobin: 10.3 g/dL — ABNORMAL LOW (ref 12.0–15.0)
MCH: 30.4 pg (ref 26.0–34.0)
MCH: 29.7 pg (ref 26.0–34.0)
MCHC: 32.8 g/dL (ref 30.0–36.0)
MCHC: 30.7 g/dL (ref 30.0–36.0)
MCV: 92.8 fL (ref 80.0–100.0)
MCV: 96.8 fL (ref 80.0–100.0)
Platelets: DECREASED K/uL (ref 150–400)
Platelets: 147 K/uL — ABNORMAL LOW (ref 150–400)
RBC: 3.91 MIL/uL (ref 3.87–5.11)
RBC: 3.47 MIL/uL — ABNORMAL LOW (ref 3.87–5.11)
RDW: 18 % — ABNORMAL HIGH (ref 11.5–15.5)
RDW: 18.3 % — ABNORMAL HIGH (ref 11.5–15.5)
WBC: 14.1 K/uL — ABNORMAL HIGH (ref 4.0–10.5)
WBC: 15.3 K/uL — ABNORMAL HIGH (ref 4.0–10.5)
nRBC: 0.6 % — ABNORMAL HIGH (ref 0.0–0.2)
nRBC: 0.7 % — ABNORMAL HIGH (ref 0.0–0.2)

## 2024-07-09 LAB — TROPONIN T, HIGH SENSITIVITY
Troponin T High Sensitivity: 486 ng/L (ref 0–19)
Troponin T High Sensitivity: 655 ng/L (ref 0–19)

## 2024-07-09 LAB — PRO BRAIN NATRIURETIC PEPTIDE: Pro Brain Natriuretic Peptide: >35000 pg/mL — ABNORMAL HIGH (ref <300.0)

## 2024-07-09 LAB — MAGNESIUM
Magnesium: 2.3 mg/dL (ref 1.7–2.4)
Magnesium: 2.3 mg/dL (ref 1.7–2.4)
Magnesium: 3 mg/dL — ABNORMAL HIGH (ref 1.7–2.4)

## 2024-07-09 LAB — PHOSPHORUS: Phosphorus: 5 mg/dL — ABNORMAL HIGH (ref 2.5–4.6)

## 2024-07-09 LAB — MRSA NEXT GEN BY PCR, NASAL: MRSA by PCR Next Gen: DETECTED — AB

## 2024-07-09 MED ORDER — ARFORMOTEROL TARTRATE 15 MCG/2ML IN NEBU
15.0000 ug | INHALATION_SOLUTION | Freq: Two times a day (BID) | RESPIRATORY_TRACT | Status: DC
  Administered 2024-07-09 – 2024-07-10 (×4): 15 ug via RESPIRATORY_TRACT
  Filled 2024-07-09 (×6): qty 2

## 2024-07-09 MED ORDER — ORAL CARE MOUTH RINSE: 15.0000 mL | OROMUCOSAL | Status: DC | PRN

## 2024-07-09 MED ORDER — ORAL CARE MOUTH RINSE: 15.0000 mL | OROMUCOSAL | Status: DC

## 2024-07-09 MED ORDER — SUCCINYLCHOLINE CHLORIDE 200 MG/10ML IV SOSY
PREFILLED_SYRINGE | INTRAVENOUS | Status: AC
  Filled 2024-07-09: qty 10

## 2024-07-09 MED ORDER — PANTOPRAZOLE SODIUM 40 MG IV SOLR
40.0000 mg | Freq: Every day | INTRAVENOUS | Status: DC
  Administered 2024-07-09: 40 mg via INTRAVENOUS
  Filled 2024-07-09: qty 10

## 2024-07-09 MED ORDER — IPRATROPIUM-ALBUTEROL 0.5-2.5 (3) MG/3ML IN SOLN
3.0000 mL | RESPIRATORY_TRACT | Status: DC
  Administered 2024-07-09: 3 mL via RESPIRATORY_TRACT
  Filled 2024-07-09: qty 3

## 2024-07-09 MED ORDER — SERTRALINE HCL 100 MG PO TABS
50.0000 mg | ORAL_TABLET | Freq: Every day | ORAL | Status: DC
  Administered 2024-07-10: 50 mg
  Filled 2024-07-09: qty 1

## 2024-07-09 MED ORDER — LACTATED RINGERS IV BOLUS
1000.0000 mL | Freq: Once | INTRAVENOUS | Status: AC
  Administered 2024-07-09: 1000 mL via INTRAVENOUS

## 2024-07-09 MED ORDER — SODIUM CHLORIDE 0.9 % IV SOLN
500.0000 mg | INTRAVENOUS | Status: DC
  Administered 2024-07-09 – 2024-07-10 (×2): 500 mg via INTRAVENOUS
  Filled 2024-07-09 (×3): qty 5

## 2024-07-09 MED ORDER — ORAL CARE MOUTH RINSE
15.0000 mL | OROMUCOSAL | Status: DC
  Administered 2024-07-09 – 2024-07-10 (×16): 15 mL via OROMUCOSAL

## 2024-07-09 MED ORDER — FUROSEMIDE 10 MG/ML IJ SOLN: 20.0000 mg | Freq: Once | INTRAMUSCULAR | Status: DC

## 2024-07-09 MED ORDER — REVEFENACIN 175 MCG/3ML IN SOLN
175.0000 ug | Freq: Every day | RESPIRATORY_TRACT | Status: DC
  Administered 2024-07-09 – 2024-07-10 (×2): 175 ug via RESPIRATORY_TRACT
  Filled 2024-07-09 (×3): qty 3

## 2024-07-09 MED ORDER — HYDROCORTISONE SOD SUC (PF) 100 MG IJ SOLR
INTRAMUSCULAR | Status: AC
  Administered 2024-07-09: 100 mg
  Filled 2024-07-09: qty 2

## 2024-07-09 MED ORDER — ETOMIDATE 2 MG/ML IV SOLN
INTRAVENOUS | Status: AC | PRN
  Administered 2024-07-09: 20 mg via INTRAVENOUS

## 2024-07-09 MED ORDER — SODIUM BICARBONATE 8.4 % IV SOLN
INTRAVENOUS | Status: AC | PRN
Start: 2024-07-09 — End: 2024-07-09
  Administered 2024-07-09: 50 meq via INTRAVENOUS

## 2024-07-09 MED ORDER — METOPROLOL TARTRATE 5 MG/5ML IV SOLN
2.5000 mg | Freq: Once | INTRAVENOUS | Status: AC
  Administered 2024-07-09: 2.5 mg via INTRAVENOUS
  Filled 2024-07-09: qty 5

## 2024-07-09 MED ORDER — CALCIUM CHLORIDE 10 % IV SOLN
INTRAVENOUS | Status: AC
  Filled 2024-07-09: qty 10

## 2024-07-09 MED ORDER — DEXMEDETOMIDINE HCL IN NACL 400 MCG/100ML IV SOLN
0.0000 ug/kg/h | INTRAVENOUS | Status: DC
  Administered 2024-07-09: 0.4 ug/kg/h via INTRAVENOUS
  Administered 2024-07-09: 0.5 ug/kg/h via INTRAVENOUS
  Administered 2024-07-10: 0.7 ug/kg/h via INTRAVENOUS
  Administered 2024-07-10 – 2024-07-12 (×7): 1.2 ug/kg/h via INTRAVENOUS
  Filled 2024-07-09 (×11): qty 100

## 2024-07-09 MED ORDER — INSULIN ASPART 100 UNIT/ML IJ SOLN
0.0000 [IU] | INTRAMUSCULAR | Status: DC
  Administered 2024-07-10: 4 [IU] via SUBCUTANEOUS
  Administered 2024-07-10: 3 [IU] via SUBCUTANEOUS
  Administered 2024-07-10: 5 [IU] via SUBCUTANEOUS
  Administered 2024-07-10: 3 [IU] via SUBCUTANEOUS
  Filled 2024-07-09 (×4): qty 3

## 2024-07-09 MED ORDER — DOCUSATE SODIUM 50 MG/5ML PO LIQD
100.0000 mg | Freq: Two times a day (BID) | ORAL | Status: DC
  Administered 2024-07-09 – 2024-07-10 (×3): 100 mg
  Filled 2024-07-09 (×3): qty 10

## 2024-07-09 MED ORDER — MAGNESIUM SULFATE 2 GM/50ML IV SOLN
2.0000 g | Freq: Once | INTRAVENOUS | Status: AC
  Administered 2024-07-09: 2 g via INTRAVENOUS
  Filled 2024-07-09: qty 50

## 2024-07-09 MED ORDER — FENTANYL CITRATE (PF) 50 MCG/ML IJ SOSY
25.0000 ug | PREFILLED_SYRINGE | Freq: Once | INTRAMUSCULAR | Status: DC
  Filled 2024-07-09: qty 1

## 2024-07-09 MED ORDER — PHENYLEPHRINE 80 MCG/ML (10ML) SYRINGE FOR IV PUSH (FOR BLOOD PRESSURE SUPPORT)
PREFILLED_SYRINGE | INTRAVENOUS | Status: AC
  Filled 2024-07-09: qty 10

## 2024-07-09 MED ORDER — NOREPINEPHRINE 4 MG/250ML-% IV SOLN
INTRAVENOUS | Status: AC
  Administered 2024-07-09: 0.533 ug/min via INTRAVENOUS
  Filled 2024-07-09: qty 250

## 2024-07-09 MED ORDER — EPINEPHRINE HCL 5 MG/250ML IV SOLN IN NS
0.5000 ug/min | INTRAVENOUS | Status: DC
  Administered 2024-07-09: 0.5 ug/min via INTRAVENOUS
  Filled 2024-07-09: qty 250

## 2024-07-09 MED ORDER — INSULIN ASPART 100 UNIT/ML IJ SOLN
0.0000 [IU] | Freq: Every day | INTRAMUSCULAR | Status: DC
  Administered 2024-07-09: 3 [IU] via SUBCUTANEOUS
  Filled 2024-07-09: qty 3

## 2024-07-09 MED ORDER — IPRATROPIUM-ALBUTEROL 0.5-2.5 (3) MG/3ML IN SOLN
3.0000 mL | Freq: Four times a day (QID) | RESPIRATORY_TRACT | Status: DC
  Administered 2024-07-09 – 2024-07-10 (×3): 3 mL via RESPIRATORY_TRACT
  Filled 2024-07-09 (×3): qty 3

## 2024-07-09 MED ORDER — ACETAMINOPHEN 325 MG PO TABS: 650.0000 mg | ORAL_TABLET | Freq: Four times a day (QID) | ORAL | Status: DC | PRN

## 2024-07-09 MED ORDER — CHLORHEXIDINE GLUCONATE CLOTH 2 % EX PADS
6.0000 | MEDICATED_PAD | Freq: Every day | CUTANEOUS | Status: DC
  Administered 2024-07-09: 6 via TOPICAL

## 2024-07-09 MED ORDER — SODIUM BICARBONATE 8.4 % IV SOLN
INTRAVENOUS | Status: AC
Start: 2024-07-09 — End: 2024-07-09
  Filled 2024-07-09: qty 100

## 2024-07-09 MED ORDER — INSULIN ASPART 100 UNIT/ML IJ SOLN: 0.0000 [IU] | Freq: Three times a day (TID) | INTRAMUSCULAR | Status: DC

## 2024-07-09 MED ORDER — POLYETHYLENE GLYCOL 3350 17 G PO PACK
17.0000 g | PACK | Freq: Every day | ORAL | Status: DC
  Administered 2024-07-10: 17 g
  Filled 2024-07-09 (×2): qty 1

## 2024-07-09 MED ORDER — SODIUM BICARBONATE 8.4 % IV SOLN
50.0000 meq | Freq: Once | INTRAVENOUS | Status: AC
  Administered 2024-07-09: 50 meq via INTRAVENOUS
  Filled 2024-07-09: qty 100

## 2024-07-09 MED ORDER — SODIUM CHLORIDE 0.9 % IV BOLUS
500.0000 mL | Freq: Once | INTRAVENOUS | Status: AC
  Administered 2024-07-09: 500 mL via INTRAVENOUS

## 2024-07-09 MED ORDER — METHYLPREDNISOLONE SODIUM SUCC 40 MG IJ SOLR: 40.0000 mg | Freq: Two times a day (BID) | INTRAMUSCULAR | Status: DC

## 2024-07-09 MED ORDER — IPRATROPIUM-ALBUTEROL 0.5-2.5 (3) MG/3ML IN SOLN: 3.0000 mL | RESPIRATORY_TRACT | Status: DC

## 2024-07-09 MED ORDER — SODIUM BICARBONATE 8.4 % IV SOLN
50.0000 meq | Freq: Once | INTRAVENOUS | Status: AC
  Administered 2024-07-09: 50 meq via INTRAVENOUS
  Filled 2024-07-09: qty 50

## 2024-07-09 MED ORDER — VASOPRESSIN 20 UNITS/100 ML INFUSION FOR SHOCK
0.0000 [IU]/min | INTRAVENOUS | Status: DC
  Administered 2024-07-09: 0.04 [IU]/min via INTRAVENOUS
  Administered 2024-07-09: 0.03 [IU]/min via INTRAVENOUS
  Administered 2024-07-10 (×2): 0.04 [IU]/min via INTRAVENOUS
  Filled 2024-07-09 (×4): qty 100

## 2024-07-09 MED ORDER — ROCURONIUM BROMIDE 10 MG/ML (PF) SYRINGE
PREFILLED_SYRINGE | INTRAVENOUS | Status: AC
  Filled 2024-07-09: qty 10

## 2024-07-09 MED ORDER — CALCIUM CHLORIDE 10 % IV SOLN
INTRAVENOUS | Status: AC
Start: 2024-07-09 — End: 2024-07-09
  Administered 2024-07-09: 1000 mg
  Filled 2024-07-09: qty 10

## 2024-07-09 MED ORDER — SODIUM BICARBONATE 8.4 % IV SOLN
INTRAVENOUS | Status: DC
  Filled 2024-07-09 (×2): qty 150
  Filled 2024-07-09: qty 1000

## 2024-07-09 MED ORDER — SODIUM CHLORIDE 0.9 % IV SOLN: INTRAVENOUS | Status: AC

## 2024-07-09 MED ORDER — METHADONE HCL 10 MG PO TABS
5.0000 mg | ORAL_TABLET | Freq: Three times a day (TID) | ORAL | Status: DC
  Administered 2024-07-09 – 2024-07-10 (×3): 5 mg
  Filled 2024-07-09 (×3): qty 1

## 2024-07-09 MED ORDER — VANCOMYCIN HCL 1250 MG/250ML IV SOLN
1250.0000 mg | INTRAVENOUS | Status: DC
  Administered 2024-07-09: 1250 mg via INTRAVENOUS
  Filled 2024-07-09: qty 250

## 2024-07-09 MED ORDER — SODIUM CHLORIDE 0.9 % IV SOLN: INTRAVENOUS | Status: AC | PRN

## 2024-07-09 MED ORDER — PERFLUTREN LIPID MICROSPHERE
1.0000 mL | INTRAVENOUS | Status: AC | PRN
  Administered 2024-07-09: 2 mL via INTRAVENOUS

## 2024-07-09 MED ORDER — BUDESONIDE 0.5 MG/2ML IN SUSP
0.5000 mg | Freq: Two times a day (BID) | RESPIRATORY_TRACT | Status: DC
  Administered 2024-07-09 – 2024-07-10 (×4): 0.5 mg via RESPIRATORY_TRACT
  Filled 2024-07-09 (×6): qty 2

## 2024-07-09 MED ORDER — ROCURONIUM BROMIDE 10 MG/ML (PF) SYRINGE
PREFILLED_SYRINGE | INTRAVENOUS | Status: AC | PRN
Start: 2024-07-09 — End: 2024-07-09
  Administered 2024-07-09: 80 mg via INTRAVENOUS

## 2024-07-09 MED ORDER — HEPARIN SODIUM (PORCINE) 5000 UNIT/ML IJ SOLN
5000.0000 [IU] | Freq: Three times a day (TID) | INTRAMUSCULAR | Status: DC
  Administered 2024-07-09 – 2024-07-10 (×3): 5000 [IU] via SUBCUTANEOUS
  Filled 2024-07-09 (×3): qty 1

## 2024-07-09 MED ORDER — METHYLPREDNISOLONE SODIUM SUCC 125 MG IJ SOLR
125.0000 mg | Freq: Once | INTRAMUSCULAR | Status: AC
  Administered 2024-07-09: 125 mg via INTRAVENOUS
  Filled 2024-07-09: qty 2

## 2024-07-09 MED ORDER — PIPERACILLIN-TAZOBACTAM 3.375 G IVPB
3.3750 g | Freq: Three times a day (TID) | INTRAVENOUS | Status: DC
  Administered 2024-07-09 – 2024-07-10 (×4): 3.375 g via INTRAVENOUS
  Filled 2024-07-09 (×4): qty 50

## 2024-07-09 MED ORDER — OXIDIZED CELLULOSE EX PADS
1.0000 | MEDICATED_PAD | Freq: Once | CUTANEOUS | Status: DC
  Filled 2024-07-09: qty 1

## 2024-07-09 MED ORDER — FENTANYL CITRATE (PF) 50 MCG/ML IJ SOSY
50.0000 ug | PREFILLED_SYRINGE | INTRAMUSCULAR | Status: AC | PRN
  Administered 2024-07-10 (×3): 50 ug via INTRAVENOUS
  Filled 2024-07-09: qty 1

## 2024-07-09 MED ORDER — FENTANYL CITRATE (PF) 50 MCG/ML IJ SOSY
50.0000 ug | PREFILLED_SYRINGE | INTRAMUSCULAR | Status: DC | PRN
  Administered 2024-07-10: 200 ug via INTRAVENOUS

## 2024-07-09 MED ORDER — NOREPINEPHRINE 4 MG/250ML-% IV SOLN
0.0000 ug/min | INTRAVENOUS | Status: DC
  Administered 2024-07-09: 20 ug/min via INTRAVENOUS
  Administered 2024-07-09: 45 ug/min via INTRAVENOUS
  Filled 2024-07-09 (×3): qty 250

## 2024-07-09 MED ORDER — ETOMIDATE 2 MG/ML IV SOLN
INTRAVENOUS | Status: AC
  Filled 2024-07-09: qty 20

## 2024-07-09 MED ORDER — HYDROCORTISONE SOD SUC (PF) 100 MG IJ SOLR
50.0000 mg | Freq: Four times a day (QID) | INTRAMUSCULAR | Status: DC
  Administered 2024-07-09 – 2024-07-10 (×3): 50 mg via INTRAVENOUS
  Filled 2024-07-09 (×3): qty 2

## 2024-07-09 MED ORDER — ACETAMINOPHEN 650 MG RE SUPP: 650.0000 mg | Freq: Four times a day (QID) | RECTAL | Status: DC | PRN

## 2024-07-09 MED ORDER — NOREPINEPHRINE 16 MG/250ML-% IV SOLN
0.0000 ug/min | INTRAVENOUS | Status: DC
  Administered 2024-07-09: 44 ug/min via INTRAVENOUS
  Administered 2024-07-09: 35 ug/min via INTRAVENOUS
  Administered 2024-07-10: 10 ug/min via INTRAVENOUS
  Filled 2024-07-09 (×3): qty 250

## 2024-07-09 MED ORDER — IPRATROPIUM-ALBUTEROL 0.5-2.5 (3) MG/3ML IN SOLN: 3.0000 mL | RESPIRATORY_TRACT | Status: DC | PRN

## 2024-07-09 MED FILL — Medication: Qty: 1 | Status: AC

## 2024-07-09 NOTE — Procedures (Signed)
   Procedure: insert indwelling catheter bedside   Urology Procedure Note:  Patient is a 68 year old female known to our practice.  Followed by Dr. Alvaro for metastatic multifocal urothelial carcinoma with spine and bone involvement, malignant right hydronephrosis with renal atrophy, and chronic Foley catheter.  Patient has continued to decompensate rapidly and Foley catheter was due for replacement.  Nursing was unable to place due to posterior urethra.  On my arrival patient was ventilated and sedated.  Family was present at bedside and agreed to proceed with Foley catheter placement.  Patient was prepped and draped in the usual sterile fashion.  Urethral meatus could not be directly visualized.  Catheter placement was achieved with digital guidance due to posterior rotation of urethra.  Immediate return of clear yellow urine.  Balloon was inflated with 10 cc of sterile water  and catheter placed to gravity drainage without dependent loops.  This concluded the procedure.  Exchange in 30 days.  Please call with questions.   Ole Bourdon, NP [facility] Pager: [phone]

## 2024-07-09 NOTE — Progress Notes (Signed)
 R fem art line very positional  Ongoing high pressor need  Will replace w axillary & dc fem art line     Ronnald Gave MSN, AGACNP-BC [facility] 07/09/2024, 6:07 PM

## 2024-07-09 NOTE — Consult Note (Signed)
 [facility] Hematology and oncology consult note   Patient Care Team: Teressa Harrie HERO, FNP as PCP - General (Family Medicine) Inocencio Soyla Lunger, MD as PCP - Electrophysiology (Cardiology) Bernie Lamar PARAS, MD as PCP - Cardiology (Cardiology) Mavis Purchase, MD as Consulting Physician (Neurosurgery) Bernie Lamar PARAS, MD as Consulting Physician (Cardiology) Sheldon Standing, MD as Consulting Physician (General Surgery) Skeet Juliene SAUNDERS, DO as Consulting Physician (Neurology) Delores Lauraine NOVAK, [facility] (Inactive) as Pharmacist (Pharmacist) Saintclair Jasper, MD as Consulting Physician (Gastroenterology) Zehr, Harlene BIRCH, PA-C as Physician Assistant (Gastroenterology) Dennise Hoes, MD as Consulting Physician (Nephrology) Pickenpack-Cousar, Fannie SAILOR, NP as Nurse Practitioner (Hospice and Palliative Medicine)   ASSESSMENT & PLAN:  [name] is 68 y.o. w with history of bladder cancer with bone mets, chronic pain on methadone , HFrEF NICM w AICD, COPD who was admitted to [facility] 07/08/24 after presenting to [facility] ED for hematemesis and diarrhea.  Patient was seen at bedside with family, and ICU team.  Report of rapid decompensation including heart failure, anuric and hypotension.  She is currently intubated.  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  Recent PET scan showed excellent response which I discussed with patient's daughter.  Her EF was found to to be 20%.  BUN was over 35,000.  Kidney function appears stable today.  CT showed possible colitis.  There is no diarrhea clinically.  Her bladder cancer is not a concern at this time.  Agree with supportive measure and work on any reversible causes can be identified.  Appreciate excellent care from ICU team.  Thank you for the consult.  Will follow with you.  Crystal JAYSON Chihuahua, MD 07/09/2024 3:45 PM   CHIEF COMPLAINTS/PURPOSE OF ADMISSION Nausea and vomiting  HISTORY OF PRESENTING ILLNESS:  [name] 68 y.o.  female consulted for nausea vomiting with history of bladder cancer. Patient is admitted for nausea vomiting and reported hematemesis.  This is resulting in ED evaluation.  CT show colitis, mild hydronephrosis, aortic atherosclerosis.  Report patient was well until the date of admission with nausea and vomiting.  Daughter was at bedside with ICU team.  Denies any diarrhea, recent illness or infection.  She was able to celebrate her birthday over the weekend.  Per ICU team, patient has declined since admission and decompensate quickly.  He was intubated this morning.  Echocardiogram showed severely decreased EF of 20%.  Follow-up regional wall abnormality.  Troponin and BNP was found to be elevated.  Infectious workup was negative.  Summary of oncologic history as follows: Oncology History  Urothelial carcinoma of bladder (HCC)  11/18/2023 Initial Diagnosis   Urothelial carcinoma of bladder (HCC)   12/01/2023 Cancer Staging   Staging form: Urinary Bladder, AJCC 8th Edition - Clinical: Stage IVB (cTX, cNX, pM1b) - Signed by [name], MD on 12/01/2023 WHO/ISUP grade (low/high): High Grade Histologic grading system: 2 grade system   12/18/2023 -  Chemotherapy   12/18/23 C1D1 01/08/24 C2D1 01/29/24 C3D1. Pembro only. Day 8 reduced EV to 1mg /kg Patient is on Treatment Plan : UROTHELIAL ADVANCED, METASTATIC ENFORTUMAB D1, D8 + PEMBROLIZUMAB  (200) D1 Q21D      02/12/2024 PET scan   PET 1. Response to therapy of osseous metastasis. 2. The pelvis is again poorly evaluated. Soft tissue density within the deep pelvis is less distinct today with decreased hypermetabolism. Potential etiologies again include response to therapy of a synchronous uterine primary versus isolated metastasis (presuming the patient is status post hysterectomy). 3. Placement  of a right ureteric stent with similar mild right-sided hydronephrosis. The left-sided hydroureteronephrosis has resolved. 4.  Aortic Atherosclerosis  (ICD10-I70.0).     MEDICAL HISTORY:  Past Medical History:  Diagnosis Date   Abnormality of gait due to impairment of balance 11/22/2020   Admission for long-term opiate analgesic use 10/24/2019   AKI (acute kidney injury) 11/14/2023   Anemia of chronic disease 06/07/2023   Arthralgia of both hands 04/07/2023   Arthritis of right hip 05/29/2016   Formatting of this note might be different from the original. Added automatically from request for surgery [id number]   Arthritis of right knee 11/27/2016   At risk for side effect of medication 12/18/2023   Bilateral lower extremity edema 06/07/2023   BMI 26.0-26.9,adult 03/29/2020   Bone lesion 11/18/2023   Cardiomyopathy (HCC)    Overview:  Ejection fraction 45% in 2015 Ejection fraction 30 to 35% in November 2018   Chronic active hepatitis (HCC) 01/04/2023   Chronic back pain    Chronic hypoxemic respiratory failure (HCC) 10/24/2019   Chronic narcotic use 03/23/2015   Chronic pain of both knees 12/21/2018   Added automatically from request for surgery [id number]  Formatting of this note might be different from the original. 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   Dyslipidemia 05/31/2015   Elevated serum glucose 06/23/2023   Fatigue 06/23/2023   Folate deficiency 01/08/2024   GERD (gastroesophageal reflux disease)    History of total hip replacement 09/25/2016   Hypoaldosteronism 07/13/2020   Hypotension 05/17/2020   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   Ileus following gastrointestinal surgery (HCC) 03/26/2015   Iron deficiency 11/21/2023   Low vitamin B12 level 11/21/2023   Major depressive disorder, single episode, moderate (HCC) 10/24/2019   Malnutrition of moderate degree 10/20/2020   Mesenteric ischemia 10/16/2020   Migraine without aura and without status migrainosus, not intractable 01/04/2023   Mixed hyperlipidemia 05/31/2015   Mixed incontinence 10/20/2020   Moderate protein-calorie malnutrition 11/03/2023   Nausea with vomiting    Nausea without vomiting 01/29/2024   Nephrostomy present (HCC) 12/01/2023   Neuropathy 01/29/2024   Normocytic anemia 06/23/2023   Osteoporosis 10/24/2019   Other spondylosis with radiculopathy, lumbar region 10/24/2019   Paraplegia (HCC) 11/14/2023   Presence of left artificial hip joint 10/24/2019   Primary insomnia 01/04/2023   PTSD (post-traumatic stress disorder)    Renal lesion 11/05/2023   Rheumatoid arthritis involving multiple sites (HCC) 04/07/2023   Senile osteoporosis 10/24/2019   Serosanguineous chronic otitis media of right ear 08/02/2021   Therapeutic opioid induced constipation 01/04/2023   Thyroid  disease    Urge incontinence of urine 11/03/2023   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   Vertebral  fracture, pathological 12/01/2023   Vitamin D  deficiency 09/25/2023    SURGICAL HISTORY: Past Surgical History:  Procedure Laterality Date   APPENDECTOMY     CARDIAC CATHETERIZATION     CARPAL TUNNEL RELEASE     CESAREAN SECTION     CHF s/p AICD   12/2010    CHOLECYSTECTOMY     COLONOSCOPY  10/16/2015   Mild colonic diverticulosis, predominantly in the left colon. Small internal hemrrhoids. Otherwise normal colonoscopy   COLONOSCOPY WITH PROPOFOL  N/A 09/27/2021   Procedure: COLONOSCOPY WITH PROPOFOL ;  Surgeon: Shila Gustav GAILS, MD;  Location: [facility];  Service: Endoscopy;  Laterality: N/A;   CORONARY ANGIOPLASTY     ESOPHAGOGASTRODUODENOSCOPY  02/04/2014   Mild gastritis. Otherwise normal EGD   HAND SURGERY Bilateral    ICD GENERATOR CHANGEOUT N/A 05/21/2019   Procedure: ICD GENERATOR CHANGEOUT;  Surgeon: Inocencio Soyla Lunger, MD;  Location: [facility] INVASIVE CV LAB;  Service: Cardiovascular;  Laterality: N/A;   ICD IMPLANT     Medtronic   intestinal blockage 2011     IR IMAGING GUIDED PORT INSERTION  12/24/2023   IR NEPHRO TUBE REMOV/FL  06/02/2024   IR NEPHROSTOMY PLACEMENT RIGHT  11/16/2023   IR URETERAL STENT PLACEMENT EXISTING ACCESS RIGHT  12/24/2023   LAPAROSCOPIC ASSISTED VENTRAL HERNIA REPAIR N/A 03/23/2015   Procedure: LAPAROSCOPIC VENTRAL WALL HERNIA REPAIR;  Surgeon: Elspeth Schultze, MD;  Location: [facility] ORS;  Service: General;  Laterality: N/A;  With MESH   LAPAROSCOPIC LYSIS OF ADHESIONS N/A 03/23/2015   Procedure: LAPAROSCOPIC LYSIS OF ADHESIONS;  Surgeon: Elspeth Schultze, MD;  Location: [facility] ORS;  Service: General;  Laterality: N/A;   LSCS      x2   NASAL SEPTUM SURGERY     NECK SURGERY     fused   TONSILLECTOMY     TRANSURETHRAL RESECTION OF BLADDER TUMOR N/A 11/16/2023   Procedure: TURBT (TRANSURETHRAL RESECTION OF BLADDER TUMOR);  Surgeon: [name] Lonni Righter, MD;  Location: [facility] ORS;  Service: Urology;  Laterality: N/A;   TYMPANOSTOMY TUBE PLACEMENT Right 2024   ULNAR NERVE TRANSPOSITION  01/23/2012   Procedure: ULNAR NERVE DECOMPRESSION/TRANSPOSITION;  Surgeon: Reyes JONETTA Budge, MD;  Location: [facility] OR[facility];  Service: Neurosurgery;  Laterality: Left;  LEFT ulnar nerve decompression    SOCIAL HISTORY: Social History    Socioeconomic History   Marital status: Single    Spouse name: Not on file   Number of children: 3   Years of education: Not on file   Highest education level: 10th grade  Occupational History   Occupation: disabled  Tobacco Use   Smoking status: Former    Current packs/day: 0.00    Average packs/day: 0.5 packs/day for 30.0 years (15.0 ttl pk-yrs)    Types: Cigarettes    Start date: 03/1990    Quit date: 03/2020    Years since quitting: 4.2   Smokeless tobacco: Never  Vaping Use   Vaping status: Never Used  Substance and Sexual Activity   Alcohol use: Yes    Alcohol/week: 2.0 standard drinks of alcohol    Types: 1 Cans of beer, 1 Standard drinks or equivalent per week    Comment: seldom   Drug use: No   Sexual activity: Not Currently  Other Topics Concern   Not on file  Social History Narrative   One level home with boyfriend   Caffeine - coffee 1-2 cups/day; Green tea 4-5 bottles a day   Exercise - some    Right handed  Social Drivers of Corporate Investment Banker Strain: Low Risk  (04/05/2023)   Overall Financial Resource Strain (CARDIA)    Difficulty of Paying Living Expenses: Not very hard  Food Insecurity: Patient Unable To Answer (07/09/2024)   Hunger Vital Sign    Worried About Running Out of Food in the Last Year: Patient unable to answer    Ran Out of Food in the Last Year: Patient unable to answer  Transportation Needs: Patient Unable To Answer (07/09/2024)   PRAPARE - Transportation    Lack of Transportation (Medical): Patient unable to answer    Lack of Transportation (Non-Medical): Patient unable to answer  Physical Activity: Insufficiently Active (04/05/2023)   Exercise Vital Sign    Days of Exercise per Week: 3 days    Minutes of Exercise per Session: 20 min  Stress: Stress Concern Present (04/05/2023)   [employer] of Occupational Health - Occupational Stress Questionnaire    Feeling of Stress : Very much  Social Connections: Patient  Unable To Answer (07/09/2024)   Social Connection and Isolation Panel    Frequency of Communication with Friends and Family: Patient unable to answer    Frequency of Social Gatherings with Friends and Family: Patient unable to answer    Attends Religious Services: Patient unable to answer    Active Member of Clubs or Organizations: Patient unable to answer    Attends Banker Meetings: Patient unable to answer    Marital Status: Patient unable to answer  Intimate Partner Violence: Patient Unable To Answer (07/09/2024)   Humiliation, Afraid, Rape, and Kick questionnaire    Fear of Current or Ex-Partner: Patient unable to answer    Emotionally Abused: Patient unable to answer    Physically Abused: Patient unable to answer    Sexually Abused: Patient unable to answer    FAMILY HISTORY: Family History  Problem Relation Age of Onset   Hypertension Mother    Heart attack Mother    Alcohol abuse Father    Hypertension Father    Heart attack Father    Heart attack Other    Cancer Other    Heart failure Other    Anesthesia problems Neg Hx    Hypotension Neg Hx    Malignant hyperthermia Neg Hx    Pseudochol deficiency Neg Hx    Breast cancer Neg Hx     ALLERGIES:  is allergic to lyrica  [pregabalin ].  MEDICATIONS:  Current Facility-Administered Medications  Medication Dose Route Frequency Provider Last Rate Last Admin   0.9 %  sodium chloride  infusion   Intra-arterial PRN Bowser, Grace E, NP       acetaminophen  (TYLENOL ) tablet 650 mg  650 mg Per Tube Q6H PRN Alghanim, Fahid, MD       Or   acetaminophen  (TYLENOL ) suppository 650 mg  650 mg Rectal Q6H PRN Alghanim, Fahid, MD       arformoterol (BROVANA) nebulizer solution 15 mcg  15 mcg Nebulization BID Bowser, Grace E, NP   15 mcg at 07/09/24 0906   azithromycin  (ZITHROMAX ) 500 mg in sodium chloride  0.9 % 250 mL IVPB  500 mg Intravenous Q24H Bowser, Grace E, NP   Stopped at 07/09/24 1352   budesonide (PULMICORT)  nebulizer solution 0.5 mg  0.5 mg Nebulization BID Bowser, Grace E, NP   0.5 mg at 07/09/24 0910   calcium  chloride 10 % injection            calcium  chloride 10 % injection  Chlorhexidine  Gluconate Cloth 2 % PADS 6 each  6 each Topical Daily Alghanim, Fahid, MD   6 each at 07/09/24 0928   dexmedetomidine (PRECEDEX) 400 MCG/100ML (4 mcg/mL) infusion  0-1.2 mcg/kg/hr Intravenous Continuous Daren Ronnald BRAVO, NP 5.44 mL/hr at 07/09/24 1300 0.4 mcg/kg/hr at 07/09/24 1300   docusate (COLACE) 50 MG/5ML liquid 100 mg  100 mg Per Tube BID Bowser, Grace E, NP   100 mg at 07/09/24 1135   EPINEPHrine  (ADRENALIN ) 5 mg in NS 250 mL (0.02 mg/mL) premix infusion  0.5-5 mcg/min Intravenous Titrated Daren Ronnald BRAVO, NP 15 mL/hr at 07/09/24 1354 5 mcg/min at 07/09/24 1354   fentaNYL  (SUBLIMAZE ) injection 25 mcg  25 mcg Intravenous Once Bero, Michael M, MD       fentaNYL  (SUBLIMAZE ) injection 50 mcg  50 mcg Intravenous Q15 min PRN Bowser, Ronnald BRAVO, NP       fentaNYL  (SUBLIMAZE ) injection 50-200 mcg  50-200 mcg Intravenous Q30 min PRN Bowser, Grace E, NP       hydrocortisone  sodium succinate  (SOLU-CORTEF ) 100 MG injection 50 mg  50 mg Intravenous Q6H Bowser, Ronnald BRAVO, NP       hydrocortisone  sodium succinate  (SOLU-CORTEF ) 100 MG injection            ipratropium-albuterol  (DUONEB) 0.5-2.5 (3) MG/3ML nebulizer solution 3 mL  3 mL Nebulization Q4H PRN Bowser, Grace E, NP       methadone  (DOLOPHINE ) tablet 5 mg  5 mg Per Tube Q8H Alghanim, Fahid, MD       norepinephrine  (LEVOPHED ) 16 mg in 250mL (0.064 mg/mL) premix infusion  0-60 mcg/min Intravenous Continuous Bowser, Ronnald BRAVO, NP       ondansetron  (ZOFRAN ) injection 4 mg  4 mg Intravenous Q6H PRN Claiborne, Claudia, MD   4 mg at 07/08/24 1837   Oral care mouth rinse  15 mL Mouth Rinse Q2H Alghanim, Fahid, MD       Oral care mouth rinse  15 mL Mouth Rinse PRN Alghanim, Fahid, MD       oxidized cellulose (Surgicel) pad 1 each  1 each Topical Once Alghanim, Fahid, MD        phenylephrine  80 mcg/10 mL injection            piperacillin -tazobactam (ZOSYN ) IVPB 3.375 g  3.375 g Intravenous Q8H Lynwood Setter, RPH 12.5 mL/hr at 07/09/24 1300 Infusion Verify at 07/09/24 1300   polyethylene glycol (MIRALAX  / GLYCOLAX ) packet 17 g  17 g Per Tube Daily Bowser, Ronnald BRAVO, NP       prochlorperazine  (COMPAZINE ) injection 10 mg  10 mg Intravenous Q6H PRN Claiborne, Claudia, MD   10 mg at 07/08/24 1918   revefenacin (YUPELRI) nebulizer solution 175 mcg  175 mcg Nebulization Daily Bowser, Ronnald BRAVO, NP   175 mcg at 07/09/24 0907   [START ON 07/10/2024] sertraline  (ZOLOFT ) tablet 50 mg  50 mg Per Tube Daily Alghanim, Fahid, MD       sodium bicarbonate  1 mEq/mL injection            sodium bicarbonate  150 mEq in dextrose  5 % 1,150 mL infusion   Intravenous Continuous Alghanim, Fahid, MD 100 mL/hr at 07/09/24 1417 New Bag at 07/09/24 1417   succinylcholine  (ANECTINE ) 200 MG/10ML syringe            vancomycin  (VANCOREADY) IVPB 1250 mg/250 mL  1,250 mg Intravenous Q48H Lynwood Setter, RPH       vasopressin (PITRESSIN) 20 Units in 100 mL (0.2 unit/mL) infusion-*FOR SHOCK*  0-0.04  Units/min Intravenous Continuous Daren Ronnald BRAVO, NP 9 mL/hr at 07/09/24 1300 0.03 Units/min at 07/09/24 1300    REVIEW OF SYSTEMS:   Intubated  PHYSICAL EXAMINATION:   Vitals:   07/09/24 1215 07/09/24 1230  BP: 93/74 92/69  Pulse: 85 86  Resp: (!) 24 (!) 24  Temp:    SpO2: 100% 100%   Filed Weights   07/08/24 1719  Weight: 120 lb (54.4 kg)    GENERAL: Intubated SKIN: skin color normal. No jaundice LUNGS: Intubated ABDOMEN: abdomen soft, non-tender and nondistended NEURO: Nodded once after repeat chest rubbing and asking   LABORATORY DATA:  I have reviewed the data as listed Lab Results  Component Value Date   WBC 14.1 (H) 07/09/2024   HGB 11.9 (L) 07/09/2024   HCT 35.0 (L) 07/09/2024   MCV 92.8 07/09/2024   PLT 155 07/09/2024   Recent Labs    06/29/24 1337 07/08/24 1117  07/09/24 0030 07/09/24 0846 07/09/24 1431 07/09/24 1438  NA 142 143 140 143 144 145  K 2.9* 2.5* 3.5 4.1 3.5 3.5  CL 106 107 108 109  --  106  CO2 26 21* 16* 8*  --   --   GLUCOSE 92 108* 124* 148*  --  237*  BUN 29* 16 12 14   --  13  CREATININE 1.89* 1.16* 1.00 1.25*  --  1.30*  CALCIUM  8.3* 8.8* 8.9 8.8*  --   --   GFRNONAA 28* 50* >60 46*  --   --   PROT 6.3* 6.1*  --  6.6  --   --   ALBUMIN  3.4* 3.2*  --  3.6  --   --   AST 10* 15  --  183*  --   --   ALT 5 <5  --  64*  --   --   ALKPHOS 54 53  --  86  --   --   BILITOT 0.4 0.3  --  0.6  --   --     RADIOGRAPHIC STUDIES: I have personally reviewed the radiological images as listed and agreed with the findings in the report. ECHOCARDIOGRAM COMPLETE Result Date: 07/09/2024    ECHOCARDIOGRAM REPORT   Patient Name:   [name] [facility] Date of Exam: 07/09/2024 Medical Rec #:  [id number]      Height:       60.0 in Accession #:    [id number]     Weight:       120.0 lb Date of Birth:  [birth year]      BSA:          1.502 m Patient Age:    68 years       BP:           93/74 mmHg Patient Gender: F              HR:           86 bpm. Exam Location:  Inpatient Procedure: 2D Echo, Cardiac Doppler, Color Doppler and Intracardiac            Opacification Agent (Both Spectral and Color Flow Doppler were            utilized during procedure). Indications:    CHF-Acute Systolic I50.21  History:        Patient has prior history of Echocardiogram examinations, most                 recent 06/25/2023. Cardiomyopathy; CAD.  Sonographer:  Jayson Gaskins Referring Phys: [id number] GRACE E BOWSER IMPRESSIONS  1. Left ventricular ejection fraction, by estimation, is <20%. The left ventricle has severely decreased function. The left ventricle demonstrates regional wall motion abnormalities (see scoring diagram/findings for description). The left ventricular internal cavity size was moderately dilated. Left ventricular diastolic function could not be evaluated.  2.  Significant left ventricular apical contrast swirling suggestive of sluggish flow without any obvious apical thrombus.  3. Right ventricular systolic function is mildly reduced. The right ventricular size is mildly enlarged. Moderately increased right ventricular wall thickness.  4. Left atrial size was mildly dilated.  5. The mitral valve is normal in structure. Mild mitral valve regurgitation. No evidence of mitral stenosis.  6. Tricuspid valve regurgitation is moderate.  7. The aortic valve is normal in structure. Aortic valve regurgitation is not visualized. No aortic stenosis is present.  8. The inferior vena cava is dilated in size with <50% respiratory variability, suggesting right atrial pressure of 15 mmHg. Comparison(s): A prior study was performed on 06/25/2023. The ejection fraction was 55-60% with grade I diastolic dysfunction and no regional wall motion abnormalities however on personal read the ejection fraction was likely mildly reduced with septal hypokinesis. Conclusion(s)/Recommendation(s): Critical findings reported to Dr. Paula Holster. FINDINGS  Left Ventricle: Significant left ventricular apical contrast swirling suggestive of sluggish flow without any obvious apical thrombus. Left ventricular ejection fraction, by estimation, is <20%. The left ventricle has severely decreased function. The left ventricle demonstrates regional wall motion abnormalities. Definity contrast agent was given IV to delineate the left ventricular endocardial borders. The left ventricular internal cavity size was moderately dilated. There is no left ventricular hypertrophy. Left ventricular diastolic function could not be evaluated.  LV Wall Scoring: The entire anterior wall, antero-lateral wall, mid and distal lateral wall, entire septum, entire apex, and mid and distal inferior wall are akinetic. The basal inferolateral segment and basal inferior segment are hypokinetic. Right Ventricle: The right ventricular size  is mildly enlarged. Moderately increased right ventricular wall thickness. Right ventricular systolic function is mildly reduced. Left Atrium: Left atrial size was mildly dilated. Right Atrium: Right atrial size was normal in size. Pericardium: There is no evidence of pericardial effusion. Mitral Valve: The mitral valve is normal in structure. There is mild thickening of the mitral valve leaflet(s). Mild mitral valve regurgitation. No evidence of mitral valve stenosis. Tricuspid Valve: The tricuspid valve is not well visualized. Tricuspid valve regurgitation is moderate . No evidence of tricuspid stenosis. Aortic Valve: The aortic valve is normal in structure. Aortic valve regurgitation is not visualized. No aortic stenosis is present. Aortic valve mean gradient measures 1.0 mmHg. Aortic valve peak gradient measures 2.5 mmHg. Aortic valve area, by VTI measures 1.98 cm. Pulmonic Valve: The pulmonic valve was normal in structure. Pulmonic valve regurgitation is not visualized. No evidence of pulmonic stenosis. Aorta: The aortic root is normal in size and structure. Venous: The inferior vena cava is dilated in size with less than 50% respiratory variability, suggesting right atrial pressure of 15 mmHg. IAS/Shunts: No atrial level shunt detected by color flow Doppler. Additional Comments: A device lead is visualized in the right atrium and right ventricle.  LEFT VENTRICLE PLAX 2D LVIDd:         6.40 cm LVIDs:         5.70 cm LV PW:         0.80 cm LV IVS:        0.80 cm LVOT diam:  1.95 cm LV SV:         20 LV SV Index:   13 LVOT Area:     2.99 cm  RIGHT VENTRICLE            IVC RV S prime:     8.49 cm/s  IVC diam: 2.00 cm TAPSE (M-mode): 2.4 cm LEFT ATRIUM             Index        RIGHT ATRIUM           Index LA Vol (A2C):   27.6 ml 18.37 ml/m  RA Area:     11.90 cm LA Vol (A4C):   53.3 ml 35.48 ml/m  RA Volume:   24.40 ml  16.24 ml/m LA Biplane Vol: 41.5 ml 27.62 ml/m  AORTIC VALVE AV Area (Vmax):    2.00  cm AV Area (Vmean):   1.84 cm AV Area (VTI):     1.98 cm AV Vmax:           78.90 cm/s AV Vmean:          50.200 cm/s AV VTI:            0.100 m AV Peak Grad:      2.5 mmHg AV Mean Grad:      1.0 mmHg LVOT Vmax:         52.90 cm/s LVOT Vmean:        31.000 cm/s LVOT VTI:          0.066 m LVOT/AV VTI ratio: 0.66  AORTA Ao Root diam: 3.20 cm MITRAL VALVE               TRICUSPID VALVE MV Area (PHT): 3.86 cm    TR Peak grad:   30.0 mmHg MV E velocity: 48.80 cm/s  TR Vmax:        274.00 cm/s                             SHUNTS                            Systemic VTI:  0.07 m                            Systemic Diam: 1.95 cm Emeline Calender Electronically signed by Emeline Calender Signature Date/Time: 07/09/2024/1:23:07 PM    Final    DG Chest Port 1 View Result Date: 07/09/2024 CLINICAL DATA:  Ventilator dependence.  Endotracheal tube placement. EXAM: PORTABLE CHEST 1 VIEW COMPARISON:  07/09/2024 FINDINGS: Endotracheal tube tip is positioned at the carina. This could be withdrawn about 2 cm for tip placement in the distal trachea. The NG tube passes into the stomach although the distal tip position is not included on the film. The cardio pericardial silhouette is enlarged. Similar asymmetric elevation right hemidiaphragm. Bibasilar atelectasis or infiltrate with probable small layering bilateral pleural effusions. Right Port-A-Cath again noted. Left-sided permanent pacemaker evident. Telemetry leads overlie the chest. Gaseous distention of the colon is identified in the right upper quadrant with gaseous distention of the stomach seen in the left subdiaphragmatic region. IMPRESSION: 1. Endotracheal tube tip is positioned at the carina. This could be withdrawn about 2 cm for tip placement in the distal trachea. 2. Bibasilar atelectasis or infiltrate with probable small layering bilateral pleural effusions. Critical Value/emergent results  were called by telephone at the time of interpretation on 07/09/2024 at 7:26 am to ER  Provider , who verbally acknowledged these results. Electronically Signed   By: Camellia Candle M.D.   On: 07/09/2024 07:27   DG Chest Port 1 View Result Date: 07/09/2024 EXAM: 1 VIEW(S) XRAY OF THE CHEST 07/09/2024 06:30:00 AM COMPARISON: 11/06/2023 CLINICAL HISTORY: sob FINDINGS: LINES, TUBES AND DEVICES: Right chest port with tip at cavoatrial junction. Left chest AICD noted. LUNGS AND PLEURA: Low lung volumes. Bilateral interstitial opacities. Elevated right hemidiaphragm with right basilar atelectasis. No pleural effusion. No pneumothorax. HEART AND MEDIASTINUM: Aortic atherosclerosis. No acute abnormality of the cardiac and mediastinal silhouettes. BONES AND SOFT TISSUES: Cervical fusion hardware. Gaseous bowel distention. IMPRESSION: 1. Low lung volumes with bilateral interstitial opacities and right basilar atelectasis. 2. Elevated right hemidiaphragm. Electronically signed by: Evalene Coho MD 07/09/2024 06:38 AM EST RP Workstation: [id number]   CT ABDOMEN PELVIS W CONTRAST Result Date: 07/08/2024 CLINICAL DATA:  Abdominal pain, nausea and vomiting. Bladder cancer. EXAM: CT ABDOMEN AND PELVIS WITH CONTRAST TECHNIQUE: Multidetector CT imaging of the abdomen and pelvis was performed using the standard protocol following bolus administration of intravenous contrast. RADIATION DOSE REDUCTION: This exam was performed according to the departmental dose-optimization program which includes automated exposure control, adjustment of the mA and/or kV according to patient size and/or use of iterative reconstruction technique. CONTRAST:  80mL OMNIPAQUE  IOHEXOL  300 MG/ML  SOLN COMPARISON:  Pelvic CT dated 11/17/2023. FINDINGS: Evaluation of this exam is limited due to respiratory motion and streak artifact caused by patient's arms and pacemaker. Lower chest: There is eventration of the right hemidiaphragm with right lung base atelectasis. Pneumonia is not excluded. Cardiac pacemaker noted. No intra-abdominal  free air or free fluid. Hepatobiliary: The liver is unremarkable. There is mild dilatation, post cholecystectomy. No retained calcified stone noted in the central CBD. Pancreas: Unremarkable. No pancreatic ductal dilatation or surrounding inflammatory changes. Spleen: Normal in size without focal abnormality. Adrenals/Urinary Tract: The adrenal glands unremarkable. Small bilateral renal cysts and subcentimeter hypodense lesions which are too small to characterize. There is a mild right hydronephrosis. Right pigtail ureteral stent with proximal tip in the proximal right ureter distal end in the urinary bladder. No hydronephrosis on the left. There is delayed excretion of contrast by the right kidney. The urinary bladder is decompressed around a Foley catheter. Stomach/Bowel: There is diffuse thickened appearance of the colon consistent with pancolitis. There is no bowel obstruction. Appendectomy. Vascular/Lymphatic: Mild aortoiliac atherosclerotic disease. The IVC is unremarkable. No portal venous gas. No adenopathy. Reproductive: The uterus is poorly visualized and not evaluated. Other: None Musculoskeletal: Osteopenia with degenerative changes of spine. Total right hip arthroplasty with associated streak artifact limiting evaluation the pelvic structures. No acute osseous pathology. IMPRESSION: 1. Pancolitis. No bowel obstruction. 2. Mild right hydronephrosis. Right pigtail ureteral stent with proximal tip in the proximal right ureter. 3.  Aortic Atherosclerosis (ICD10-I70.0). Electronically Signed   By: Vanetta Chou M.D.   On: 07/08/2024 14:50   NM PET Image Restag (PS) Skull Base To Thigh Result Date: 07/02/2024 EXAM: PET AND CT SKULL BASE TO MID THIGH 07/01/2024 02:22:24 PM TECHNIQUE: RADIOPHARMACEUTICAL: 6.31 mCi F-18 FDG Uptake time 60 minutes. Glucose level 98 mg/dl. Blood pool SUV 2.2. PET imaging was acquired from the base of the skull to the mid thighs. Non-contrast enhanced computed tomography was  obtained for attenuation correction and anatomic localization. COMPARISON: 02/12/2024 CLINICAL HISTORY: Follow up to evaluate treatment response from bladder cancer. FINDINGS:  HEAD AND NECK: No metabolically active cervical lymphadenopathy. CHEST: No metabolically active pulmonary nodules. No metabolically active lymphadenopathy. Injection related activity in the right port-a-cath port. AICD noted. Right port-a-cath tip in SVC. Atheromatous vascular calcification of the aortic arch. ABDOMEN AND PELVIS: No metabolically active intraperitoneal mass. No metabolically active lymphadenopathy. Scattered presumed physiologic activity in bowel. Accentuated pelvic activity with CT data obscured by streak artifact from the right hip implant. This activity may be in the distal ureter or in the vicinity of the cervix/lower uterine segment. Maximum SUV 10.5. Cholecystectomy. Right double J ureteral stent noted with proximal loop formed in the proximal ureter and stranding on the right renal sinus adipose tissues and perirenal space greater than the left. Foley catheter in the urinary bladder. 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 Right total hip prosthesis. Findings of remote sacral fracture noted. IMPRESSION: 1. No evidence of metabolically active disease related to bladder cancer. 2. Focal pelvic hypermetabolism with maximum SUV 10.5, localization limited by streak artifact from right hip prosthesis; consider dedicated pelvic evaluation to determine origin (distal ureter versus cervix/lower uterine segment). 3. Prior osseous metastatic sites are not hypermetabolic. 4. Degenerative changes of the right hip status post total hip arthroplasty. 5. Findings of a remote sacral fracture. Electronically signed by: Ryan Salvage MD 07/02/2024 04:24 PM EST RP Workstation: [id number]

## 2024-07-09 NOTE — Procedures (Signed)
 Arterial Catheter Insertion Procedure Note  [name]  [id number]  [birth year]  Date:07/09/24  Time:6:04 PM    Provider Performing: Ronnald FORBES Gave    Procedure: Insertion of Arterial Line (63379) with US  guidance (23062)   Indication(s) Shock requiring multiple vasopressors    Consent Risks of the procedure as well as the alternatives and risks of each were explained to the patient and/or caregiver.  Consent for the procedure was obtained and is signed in the bedside chart  Anesthesia None   Time Out Verified patient identification, verified procedure, site/side was marked, verified correct patient position, special equipment/implants available, medications/allergies/relevant history reviewed, required imaging and test results available.   Sterile Technique Maximal sterile technique including full sterile barrier drape, hand hygiene, sterile gown, sterile gloves, mask, hair covering, sterile ultrasound probe cover (if used).   Procedure Description Area of catheter insertion was cleaned with chlorhexidine  and draped in sterile fashion. With real-time ultrasound guidance an arterial catheter was placed into the right axillary artery.  Appropriate arterial tracings confirmed on monitor.     Complications/Tolerance None; patient tolerated the procedure well.   EBL Minimal   Specimen(s) None    Ronnald Gave MSN, AGACNP-BC [facility] Amion for pager  07/09/2024, 6:05 PM

## 2024-07-09 NOTE — Consult Note (Addendum)
 Cardiology  Consult:   Patient ID: [name]; MRN: [id number]; DOB: 05-08-1956   Admission date: 07/08/2024  Primary Care Provider: Teressa Harrie HERO, FNP Primary Cardiologist: Dr. Bernie  Chief Complaint:  Eval of Shock (currently presumed septic vs mixed)  Patient Profile:   [name] is a 68 y.o. female is a 68 year old with non-ischemic cardiomyopathy and metastatic bladder cancer who presents with worsening tachycardia and hypotension. Consult by Ms. Bowser NP, in the setting of critical illness.   She has a history of non-ischemic cardiomyopathy with an ejection fraction (EF) of 35% and a primary prevention ICD. Her EF had previously improved to 55% in 2024. She has metastatic bladder cancer and has been receiving chemotherapy. Recently, she developed pancolitis, presenting with repeated vomiting and diarrhea, which was non-bloody. She was admitted to [facility] for pancolitis complicated by hypokalemia, hypomagnesemia, and hypovolemia, leading to the holding of her Lasix . Her vomiting has persisted, but diarrhea has resolved.  She is on maintenance methadone  for pain control. Her troponin levels are nominally elevated, and there is no evidence of myocarditis from her current treatment with enfortumab. Her anemia has improved from 8.2 to 11.9. Her BNP is greater than 35,000.  She has been experiencing unintentional weight loss and has bone metastases.   All of this is from chart review.  I joined evaluation of patient just after goals of care conversation- transition to DNR.  Allergies:    Allergies  Allergen Reactions   Lyrica  [Pregabalin ] Other (See Comments)    Jerking, twitching, memory changes     Social History:   Social History   Socioeconomic History   Marital status: Single    Spouse name: Not on file   Number of children: 3   Years of education: Not on file   Highest education level: 10th grade  Occupational History   Occupation:  disabled  Tobacco Use   Smoking status: Former    Current packs/day: 0.00    Average packs/day: 0.5 packs/day for 30.0 years (15.0 ttl pk-yrs)    Types: Cigarettes    Start date: 03/1990    Quit date: 03/2020    Years since quitting: 4.2   Smokeless tobacco: Never  Vaping Use   Vaping status: Never Used  Substance and Sexual Activity   Alcohol use: Yes    Alcohol/week: 2.0 standard drinks of alcohol    Types: 1 Cans of beer, 1 Standard drinks or equivalent per week    Comment: seldom   Drug use: No   Sexual activity: Not Currently  Other Topics Concern   Not on file  Social History Narrative   One level home with boyfriend   Caffeine - coffee 1-2 cups/day; Green tea 4-5 bottles a day   Exercise - some    Right handed      Social Drivers of Health   Financial Resource Strain: Low Risk  (04/05/2023)   Overall Financial Resource Strain (CARDIA)    Difficulty of Paying Living Expenses: Not very hard  Food Insecurity: Patient Unable To Answer (07/09/2024)   Hunger Vital Sign    Worried About Running Out of Food in the Last Year: Patient unable to answer    Ran Out of Food in the Last Year: Patient unable to answer  Transportation Needs: Patient Unable To Answer (07/09/2024)   PRAPARE - Transportation    Lack of Transportation (Medical): Patient unable to answer    Lack of Transportation (Non-Medical): Patient unable  to answer  Physical Activity: Insufficiently Active (04/05/2023)   Exercise Vital Sign    Days of Exercise per Week: 3 days    Minutes of Exercise per Session: 20 min  Stress: Stress Concern Present (04/05/2023)   [employer] of Occupational Health - Occupational Stress Questionnaire    Feeling of Stress : Very much  Social Connections: Patient Unable To Answer (07/09/2024)   Social Connection and Isolation Panel    Frequency of Communication with Friends and Family: Patient unable to answer    Frequency of Social Gatherings with Friends and Family:  Patient unable to answer    Attends Religious Services: Patient unable to answer    Active Member of Clubs or Organizations: Patient unable to answer    Attends Banker Meetings: Patient unable to answer    Marital Status: Patient unable to answer  Intimate Partner Violence: Patient Unable To Answer (07/09/2024)   Humiliation, Afraid, Rape, and Kick questionnaire    Fear of Current or Ex-Partner: Patient unable to answer    Emotionally Abused: Patient unable to answer    Physically Abused: Patient unable to answer    Sexually Abused: Patient unable to answer    Family History:   The patient's family history includes Alcohol abuse in her father; Cancer in an other family member; Heart attack in her father, mother, and another family member; Heart failure in an other family member; Hypertension in her father and mother. There is no history of Anesthesia problems, Hypotension, Malignant hyperthermia, Pseudochol deficiency, or Breast cancer.    ROS:  Please see the history of present illness.   Physical Exam/Data:   Vitals:   07/09/24 1145 07/09/24 1200 07/09/24 1215 07/09/24 1230  BP: 90/66 92/70 93/74  92/69  Pulse:  86 85 86  Resp: (!) 24 (!) 24 (!) 24 (!) 24  Temp:  97.6 F (36.4 C)    TempSrc:  Axillary    SpO2:  100% 100% 100%  Weight:      Height:        Intake/Output Summary (Last 24 hours) at 07/09/2024 1620 Last data filed at 07/09/2024 1300 Gross per 24 hour  Intake 2501.21 ml  Output --  Net 2501.21 ml   Filed Weights   07/08/24 1719  Weight: 54.4 kg   Body mass index is 23.44 kg/m.   Gen: Critically ill  Neck: No JVD Cardiac: No Rubs or Gallops, no Murmur, regular tachycardia Respiratory: Mechanical breath sounds GI: Soft, nontender, non-distended  MS: No  edema Integument: Skin feels warm  Echocardiogram: EF under 20% with dilation and apical ballooning EKG: Sinus rhythm with frequent premature ventricular contractions  (07/08/2024) Telemetry: Sinus tachycardia with frequent runs of non-sustained ventricular tachycardia  Relevant CV Studies:  Cardiac Studies & Procedures   ______________________________________________________________________________________________     ECHOCARDIOGRAM  ECHOCARDIOGRAM COMPLETE 07/09/2024  Narrative ECHOCARDIOGRAM REPORT    Patient Name:   ZAYANA SALVADOR [facility] Date of Exam: 07/09/2024 Medical Rec #:  [id number]      Height:       60.0 in Accession #:    [id number]     Weight:       120.0 lb Date of Birth:  [birth year]      BSA:          1.502 m Patient Age:    68 years       BP:           93/74 mmHg Patient Gender: F  HR:           86 bpm. Exam Location:  Inpatient  Procedure: 2D Echo, Cardiac Doppler, Color Doppler and Intracardiac Opacification Agent (Both Spectral and Color Flow Doppler were utilized during procedure).  Indications:    CHF-Acute Systolic I50.21  History:        Patient has prior history of Echocardiogram examinations, most recent 06/25/2023. Cardiomyopathy; CAD.  Sonographer:    Jayson Gaskins Referring Phys: [phone] GRACE E BOWSER  IMPRESSIONS   1. Left ventricular ejection fraction, by estimation, is <20%. The left ventricle has severely decreased function. The left ventricle demonstrates regional wall motion abnormalities (see scoring diagram/findings for description). The left ventricular internal cavity size was moderately dilated. Left ventricular diastolic function could not be evaluated. 2. Significant left ventricular apical contrast swirling suggestive of sluggish flow without any obvious apical thrombus. 3. Right ventricular systolic function is mildly reduced. The right ventricular size is mildly enlarged. Moderately increased right ventricular wall thickness. 4. Left atrial size was mildly dilated. 5. The mitral valve is normal in structure. Mild mitral valve regurgitation. No evidence of mitral stenosis. 6. Tricuspid  valve regurgitation is moderate. 7. The aortic valve is normal in structure. Aortic valve regurgitation is not visualized. No aortic stenosis is present. 8. The inferior vena cava is dilated in size with <50% respiratory variability, suggesting right atrial pressure of 15 mmHg.  Comparison(s): A prior study was performed on 06/25/2023. The ejection fraction was 55-60% with grade I diastolic dysfunction and no regional wall motion abnormalities however on personal read the ejection fraction was likely mildly reduced with septal hypokinesis.  Conclusion(s)/Recommendation(s): Critical findings reported to Dr. Paula Holster.  FINDINGS Left Ventricle: Significant left ventricular apical contrast swirling suggestive of sluggish flow without any obvious apical thrombus. Left ventricular ejection fraction, by estimation, is <20%. The left ventricle has severely decreased function. The left ventricle demonstrates regional wall motion abnormalities. Definity contrast agent was given IV to delineate the left ventricular endocardial borders. The left ventricular internal cavity size was moderately dilated. There is no left ventricular hypertrophy. Left ventricular diastolic function could not be evaluated.   LV Wall Scoring: The entire anterior wall, antero-lateral wall, mid and distal lateral wall, entire septum, entire apex, and mid and distal inferior wall are akinetic. The basal inferolateral segment and basal inferior segment are hypokinetic.  Right Ventricle: The right ventricular size is mildly enlarged. Moderately increased right ventricular wall thickness. Right ventricular systolic function is mildly reduced.  Left Atrium: Left atrial size was mildly dilated.  Right Atrium: Right atrial size was normal in size.  Pericardium: There is no evidence of pericardial effusion.  Mitral Valve: The mitral valve is normal in structure. There is mild thickening of the mitral valve leaflet(s). Mild  mitral valve regurgitation. No evidence of mitral valve stenosis.  Tricuspid Valve: The tricuspid valve is not well visualized. Tricuspid valve regurgitation is moderate . No evidence of tricuspid stenosis.  Aortic Valve: The aortic valve is normal in structure. Aortic valve regurgitation is not visualized. No aortic stenosis is present. Aortic valve mean gradient measures 1.0 mmHg. Aortic valve peak gradient measures 2.5 mmHg. Aortic valve area, by VTI measures 1.98 cm.  Pulmonic Valve: The pulmonic valve was normal in structure. Pulmonic valve regurgitation is not visualized. No evidence of pulmonic stenosis.  Aorta: The aortic root is normal in size and structure.  Venous: The inferior vena cava is dilated in size with less than 50% respiratory variability, suggesting right atrial pressure of  15 mmHg.  IAS/Shunts: No atrial level shunt detected by color flow Doppler.  Additional Comments: A device lead is visualized in the right atrium and right ventricle.   LEFT VENTRICLE PLAX 2D LVIDd:         6.40 cm LVIDs:         5.70 cm LV PW:         0.80 cm LV IVS:        0.80 cm LVOT diam:     1.95 cm LV SV:         20 LV SV Index:   13 LVOT Area:     2.99 cm   RIGHT VENTRICLE            IVC RV S prime:     8.49 cm/s  IVC diam: 2.00 cm TAPSE (M-mode): 2.4 cm  LEFT ATRIUM             Index        RIGHT ATRIUM           Index LA Vol (A2C):   27.6 ml 18.37 ml/m  RA Area:     11.90 cm LA Vol (A4C):   53.3 ml 35.48 ml/m  RA Volume:   24.40 ml  16.24 ml/m LA Biplane Vol: 41.5 ml 27.62 ml/m AORTIC VALVE AV Area (Vmax):    2.00 cm AV Area (Vmean):   1.84 cm AV Area (VTI):     1.98 cm AV Vmax:           78.90 cm/s AV Vmean:          50.200 cm/s AV VTI:            0.100 m AV Peak Grad:      2.5 mmHg AV Mean Grad:      1.0 mmHg LVOT Vmax:         52.90 cm/s LVOT Vmean:        31.000 cm/s LVOT VTI:          0.066 m LVOT/AV VTI ratio: 0.66  AORTA Ao Root diam: 3.20  cm  MITRAL VALVE               TRICUSPID VALVE MV Area (PHT): 3.86 cm    TR Peak grad:   30.0 mmHg MV E velocity: 48.80 cm/s  TR Vmax:        274.00 cm/s  SHUNTS Systemic VTI:  0.07 m Systemic Diam: 1.95 cm  Emeline Calender Electronically signed by Emeline Calender Signature Date/Time: 07/09/2024/1:23:07 PM    Final    MONITORS  LONG TERM MONITOR (3-14 DAYS) 03/12/2021  Narrative Patch Wear Time:  10 days and 18 hours (2022-06-30T15:29:07-399 to 2022-07-11T09:57:18-0400)  Patient had a min HR of 59 bpm, max HR of 119 bpm, and avg HR of 75 bpm. Predominant underlying rhythm was Possible Atrial Pacing. Isolated SVEs were rare (<1.0%), SVE Couplets were rare (<1.0%), and no SVE Triplets were present. Isolated VEs were frequent (5.6%, 56076), VE Couplets were occasional (1.0%, 5113), and VE Triplets were rare (<1.0%, 42). Ventricular Bigeminy and Trigeminy were present  Summary conclusions: Multiple triggered events showing sinus rhythm some showing PVCs.       ______________________________________________________________________________________________      Laboratory Data:  Chemistry Recent Labs  Lab 07/09/24 0030 07/09/24 0846 07/09/24 1431 07/09/24 1438  NA 140 143 144 145  K 3.5 4.1 3.5 3.5  CL 108 109  --  106  CO2 16* 8*  --   --  GLUCOSE 124* 148*  --  237*  BUN 12 14  --  13  CREATININE 1.00 1.25*  --  1.30*  CALCIUM  8.9 8.8*  --   --   GFRNONAA >60 46*  --   --   ANIONGAP 17* 26*  --   --     Recent Labs  Lab 07/08/24 1117 07/09/24 0846  PROT 6.1* 6.6  ALBUMIN  3.2* 3.6  AST 15 183*  ALT <5 64*  ALKPHOS 53 86  BILITOT 0.3 0.6   Hematology Recent Labs  Lab 07/08/24 1117 07/09/24 0030 07/09/24 0846 07/09/24 1323 07/09/24 1431 07/09/24 1438  WBC 10.5  --  14.1*  --   --   --   RBC 3.29*  --  3.91  --   --   --   HGB 9.8*   < > 11.9*  --  8.2* 11.9*  HCT 29.8*   < > 36.3  --  24.0* 35.0*  MCV 90.6  --  92.8  --   --   --   MCH 29.8  --   30.4  --   --   --   MCHC 32.9  --  32.8  --   --   --   RDW 17.0*  --  18.0*  --   --   --   PLT 240  --  PLATELET CLUMPS NOTED ON SMEAR, COUNT APPEARS DECREASED 155  --   --    < > = values in this interval not displayed.    BNP Recent Labs  Lab 07/09/24 1323  PROBNP >35,000.0*    DDimer  Recent Labs  Lab 07/09/24 1323  DDIMER 2.82*     Assessment and Plan:   Metastatic bladder cancer with bone metastases - Metastatic bladder cancer with bone metastases, currently on enfortumab. Concerns about immunotherapy contributing to pancolitis but unlikely cardiotoxicity. Cardiotoxicity is less likely with enfortumab compared to brentuximab. Her prognosis is over all poor due to her comorbidity  Nonischemic cardiomyopathy with reduced ejection fraction and ICD - NYHA Unclear, LVEF < 20%, Septic shock picture with Venous Cox 73.3,  - Nonischemic cardiomyopathy with EF under 20%, showing apical ballooning suggestive of stress-induced cardiomyopathy. ICD in place since 2020. EF previously improved from 35% to 55%. Current EF decline likely stress-mediated rather than immunotherapy-related. No evidence of myocarditis or pericardial effusion. Discontinued Entresto  due to palliative care transition (See Dr. Katherleen Note - DNR; Recommend Magnet short term; I have reached out to my EP teammates to help facilitate de-activation of tachy therapies  Sepsis with lactic acidosis and shock - Sepsis with lactic acidosis and shock, likely secondary to pancolitis and metastatic cancer. Lactate level at 12, indicating significant metabolic stress. Responding better to levophed  and vasopressin, suggesting a septic rather than cardiogenic shock profile. No clear evidence of infection, but pancolitis may contribute to systemic inflammation. - Continue vasopressors; I have added on LFTs  Pancolitis secondary to chemotherapy/immunotherapy Pancolitis secondary to chemotherapy/immunotherapy, presenting with vomiting  and diarrhea. Diarrhea has resolved, but vomiting persists. Differential includes infection or immunotherapy-induced inflammation. No evidence of abdominal tenderness or significant abdominal symptoms. LFTs added to assess liver function. - Added liver function tests to assess for shock liver; her chemo can cause serositis from chart review her sx are less consistent with this  Sinus tachycardia with frequent PVCs and nonsustained ventricular tachycardia Sinus tachycardia with frequent PVCs and nonsustained VT, likely related to stress and sepsis. No sustained VT observed. ICD in place to  manage arrhythmias. Current management focuses on supportive care and monitoring. DNR status confirmed, with no CPR or ICD shocks in case of cardiac arrest. - if frequent VT, start IV amiodarone  CRITICAL CARE Performed by: Sloka Volante A Alexys Lobello  Total critical care time: 64  minutes. Critical care time was exclusive of separately billable procedures and treating other patients. Critical care was necessary to treat or prevent imminent or life-threatening deterioration. Critical care was time spent personally by me on the following activities: development of treatment plan with surrogates as well as nursing, discussions with PCCM,  examination of patient, ordering and review of laboratory studies.  Prognosis is overall guarded. Addendum: In conjunction with Ms. Leora' GOC conversation I discussed with family stopping ICD therapies based on share decision making.  Still continued medication interventions.  I have discussed with family (multiple members); critical care, and [employer] team.  Addendum: reviewed care with shock team.   For questions or updates, please contact [facility] Please consult [email or web address] for contact info under Cardiology/STEMI.   Stanly Leavens, MD FASE [facility] Cardiologist [facility]  [address] [place], [address] [place], [place] [address] [phone]  4:20 PM

## 2024-07-09 NOTE — Consult Note (Addendum)
 NAME:  [name], MRN:  [id number], DOB:  [birth year], LOS: 1 ADMISSION DATE:  07/08/2024, CONSULTATION DATE:  07/09/24 REFERRING MD:   [facility], CHIEF COMPLAINT:   hematemesis   History of Present Illness:   68 yo F PMH bladder ca w bone mets, chronic pain on methadone , HFrEF NICM w AICD,  COPD who was admitted to [facility] 07/08/24 after presenting to [facility] ED w CC hematemesis and diarrhea. CT a/p c/f pancolitis w/o obstruction and was started on abx, as well as curl of R ureteral stent w mild hydronephrosis for which uro was consulted but intervention not indicated.   Was boarding in ED all night, developed worse tachycardia and looks like she was given antihypertensives, BZD. Later developed respiratory distress and hypotension. She was intubated and started on NE.   PCCM is called in this setting    In chart review, it looks like this pt reported diarrhea to admitting provider. In d/w RN, there has been no report of diarrhea since presentation. It sounds like she has had ongoing vomiting but no hematemesis.    Pertinent  Medical History  Bladder ca mets to bone COPD NICM HFrEF s/p AICD   Significant Hospital Events: Including procedures, antibiotic start and stop dates in addition to other pertinent events     Interim History / Subjective:  Has received no sedation since intubation at 0700   Objective    Blood pressure (!) 110/91, pulse (!) 139, temperature 98.1 F (36.7 C), temperature source Oral, resp. rate 18, height 5' (1.524 m), weight 54.4 kg, SpO2 97%.    Vent Mode: PRVC FiO2 (%):  [50 %-55 %] 50 % Set Rate:  [15 bmp-18 bmp] 18 bmp Vt Set:  [360 mL-430 mL] 360 mL PEEP:  [5 cmH20-6 cmH20] 5 cmH20 Pressure Support:  [6 cmH20] 6 cmH20 Plateau Pressure:  [15 cmH20-16 cmH20] 16 cmH20   Intake/Output Summary (Last 24 hours) at 07/09/2024 0831 Last data filed at 07/09/2024 9461 Gross per 24 hour  Intake 1206.42 ml  Output --  Net 1206.42 ml   Filed Weights    07/08/24 1719  Weight: 54.4 kg    Examination: General: chronically and critically ill appearing older adult F intubated  Neuro: chemically paralyzed.  HENT: ETT secure anicteric sclera  Lungs: Diffuse wheezing, mechanically ventilated  Cardiovascular: tachycardic, regular. S1s2 Abdomen: soft round  Extremities: BLE pitting edema  GU: defer  Resolved problem list   Assessment and Plan   Acute respiratory failure, likely hypoxic/hypercarbic  COPD w acute exacerbation  Malpositioned ETT  Elevated R hemidiaphragm  Pleural effusion Hx tobacco use  P -start precedex PRN fent -- this patient is chemically paralyzed without PAD coverage.  -RASS goal when paralytic wears off will be 0  -pull back ETT 2cm -ABG -VAP, pulm hygiene -wean MV support as able -start solumedrol, give mag -adding triple therapy + PRN duoneb.  -add azithro  -send trach asp, RVP   Shock, likely mixed hypovolemic 2/2 vomiting and  septic, possible cardiogenic  Lactic acidosis  -last ECHO in 2024 w EF 55-60% and normal RV fxn, cardiogenic less favored  -looks like she has had pretty modest volume resusc in ED, rcving 1.2L IVF since arrival despite reports of refractory vomiting and PTA vomiting ?diarrhea   P -add'l 1 L LR over 2hr -NE for MAP > 65  -azithro -- broaden to vanc zosyn   -follow cx data  -coox, ECHO, BNP, trop  -LA PRN  AKI Metabolic acidosis Chronic  foley -appreciate uro help w replacing foley -trend renal indices, UOP -Bicarb   ?pancolitis  Hypokalemia Hypomagnesemia  -hematemesis was the reported CC at time of our consult but it does not sounds like she has had hematemesis -no reported diarrhea since presentation P -add'l fluids as above  -flagyl , rocephin  -if she has diarrhea send GI panel -send BMP  and replace lytes PRN   NICM S/p AICD  Moderate mitral regurg  Hx HFrEF, now improved EF  HTN HLD Abnormal EKG, prolonged qtc  -last ECHO 2024 w EF 55% normal RV  fxn P - holding home meds for now  -optimize lytes, minimize qtc prolonging agents   Bladder ca mets to bone Mild R hydronephrosis w R ureteral stent in place  Chronic pain P -cont home methadone    GOC -follow w palli for sx management.   -w onc she is on reducted dose enfortumab vedotin  (EV)   Labs   CBC: Recent Labs  Lab 07/08/24 1117 07/09/24 0030  WBC 10.5  --   NEUTROABS 8.3*  --   HGB 9.8* 10.0*  HCT 29.8* 30.3*  MCV 90.6  --   PLT 240  --     Basic Metabolic Panel: Recent Labs  Lab 07/08/24 1117 07/09/24 0030  NA 143 140  K 2.5* 3.5  CL 107 108  CO2 21* 16*  GLUCOSE 108* 124*  BUN 16 12  CREATININE 1.16* 1.00  CALCIUM  8.8* 8.9  MG 1.1* 2.3   GFR: Estimated Creatinine Clearance: 37.6 mL/min (by C-G formula based on SCr of 1 mg/dL). Recent Labs  Lab 07/08/24 1117  WBC 10.5    Liver Function Tests: Recent Labs  Lab 07/08/24 1117  AST 15  ALT <5  ALKPHOS 53  BILITOT 0.3  PROT 6.1*  ALBUMIN  3.2*   Recent Labs  Lab 07/08/24 1117  LIPASE 15   No results for input(s): AMMONIA in the last 168 hours.  ABG    Component Value Date/Time   PHART 7.38 07/09/2024 0016   PCO2ART 23 (L) 07/09/2024 0016   PO2ART 99 07/09/2024 0016   HCO3 13.6 (L) 07/09/2024 0016   TCO2 26 04/04/2009 1012   ACIDBASEDEF 9.5 (H) 07/09/2024 0016   O2SAT 97.1 07/09/2024 0016     Coagulation Profile: Recent Labs  Lab 07/08/24 1117  INR 1.1    Cardiac Enzymes: No results for input(s): CKTOTAL, CKMB, CKMBINDEX, TROPONINI in the last 168 hours.  HbA1C: Hgb A1c MFr Bld  Date/Time Value Ref Range Status  06/23/2023 03:16 PM 5.5 4.8 - 5.6 % Final    Comment:             Prediabetes: 5.7 - 6.4          Diabetes: >6.4          Glycemic control for adults with diabetes: <7.0     CBG: No results for input(s): GLUCAP in the last 168 hours.  Review of Systems:   Unable to obtain pt is intubated and chemically paralyzed.   Past Medical History:   She,  has a past medical history of Abnormality of gait due to impairment of balance (11/22/2020), Admission for long-term opiate analgesic use (10/24/2019), AKI (acute kidney injury) (11/14/2023), Anemia of chronic disease (06/07/2023), Arthralgia of both hands (04/07/2023), Arthritis of right hip (05/29/2016), Arthritis of right knee (11/27/2016), At risk for side effect of medication (12/18/2023), Bilateral lower extremity edema (06/07/2023), BMI 26.0-26.9,adult (03/29/2020), Bone lesion (11/18/2023), Cardiomyopathy (HCC), Chronic active hepatitis (HCC) (01/04/2023), Chronic  back pain, Chronic hypoxemic respiratory failure (HCC) (10/24/2019), Chronic narcotic use (03/23/2015), Chronic pain of both knees (12/21/2018), Chronic systolic congestive heart failure, NYHA class 2 (HCC) (06/19/2017), CKD (chronic kidney disease), stage III (HCC) (12/01/2023), 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), Community acquired pneumonia of left lower lobe of lung (09/25/2023), COPD (chronic obstructive pulmonary disease) (HCC), COPD exacerbation (HCC) (01/04/2023), Coronary artery disease involving native coronary artery of native heart without angina pectoris (05/31/2015), Decreased appetite (12/18/2023), Depression, major, recurrent, moderate (HCC) (10/24/2019), Diarrhea (05/17/2020), Dietary folate deficiency anemia (11/21/2023), Dilated cardiomyopathy (HCC) (06/19/2017), Diverticulosis, 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), 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 (06/19/2017), Dyslipidemia (05/31/2015), Elevated serum glucose (06/23/2023), Fatigue (06/23/2023), Folate deficiency (01/08/2024), GERD (gastroesophageal reflux disease), History of total hip replacement (09/25/2016), 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), Hypotension (05/17/2020), ICD (implantable cardioverter-defibrillator) in place (06/19/2017), Ileus following gastrointestinal surgery (HCC) (03/26/2015), Iron deficiency (11/21/2023), Low vitamin B12 level  (11/21/2023), Major depressive disorder, single episode, moderate (HCC) (10/24/2019), Malnutrition of moderate degree (10/20/2020), Mesenteric ischemia (10/16/2020), Migraine without aura and without status migrainosus, not intractable (01/04/2023), Mixed hyperlipidemia (05/31/2015), Mixed incontinence (10/20/2020), Moderate protein-calorie malnutrition (11/03/2023), Nausea with vomiting, Nausea without vomiting (01/29/2024), Nephrostomy present (HCC) (12/01/2023), Neuropathy (01/29/2024), Normocytic anemia (06/23/2023), Osteoporosis (10/24/2019), Other spondylosis with radiculopathy, lumbar region (10/24/2019), Paraplegia (HCC) (11/14/2023), Presence of left artificial hip joint (10/24/2019), Primary insomnia (01/04/2023), PTSD (post-traumatic stress disorder), Renal lesion (11/05/2023), Rheumatoid arthritis involving multiple sites (HCC) (04/07/2023), Senile osteoporosis (10/24/2019), Serosanguineous chronic otitis media of right ear (08/02/2021), Therapeutic opioid induced constipation (01/04/2023), Thyroid  disease, Urge incontinence of urine (11/03/2023), Urothelial carcinoma of bladder (HCC) (11/18/2023), Vertebral fracture, pathological (12/01/2023), and Vitamin D  deficiency (09/25/2023).   Surgical History:   Past Surgical History:  Procedure Laterality Date   APPENDECTOMY     CARDIAC CATHETERIZATION     CARPAL TUNNEL RELEASE     CESAREAN SECTION     CHF s/p AICD   12/2010   CHOLECYSTECTOMY     COLONOSCOPY  10/16/2015   Mild colonic diverticulosis, predominantly in the left colon. Small internal hemrrhoids. Otherwise normal colonoscopy   COLONOSCOPY WITH PROPOFOL  N/A 09/27/2021   Procedure: COLONOSCOPY WITH PROPOFOL ;  Surgeon: Shila Gustav GAILS, MD;  Location: [facility];  Service: Endoscopy;  Laterality: N/A;   CORONARY ANGIOPLASTY     ESOPHAGOGASTRODUODENOSCOPY  02/04/2014   Mild gastritis. Otherwise normal EGD   HAND SURGERY Bilateral    ICD GENERATOR CHANGEOUT N/A 05/21/2019    Procedure: ICD GENERATOR CHANGEOUT;  Surgeon: Inocencio Soyla Lunger, MD;  Location: [facility] INVASIVE CV LAB;  Service: Cardiovascular;  Laterality: N/A;   ICD IMPLANT     [employer]   intestinal blockage 2011     IR IMAGING GUIDED PORT INSERTION  12/24/2023   IR NEPHRO TUBE REMOV/FL  06/02/2024   IR NEPHROSTOMY PLACEMENT RIGHT  11/16/2023   IR URETERAL STENT PLACEMENT EXISTING ACCESS RIGHT  12/24/2023   LAPAROSCOPIC ASSISTED VENTRAL HERNIA REPAIR N/A 03/23/2015   Procedure: LAPAROSCOPIC VENTRAL WALL HERNIA REPAIR;  Surgeon: Elspeth Schultze, MD;  Location: [facility];  Service: General;  Laterality: N/A;  With MESH   LAPAROSCOPIC LYSIS OF ADHESIONS N/A 03/23/2015   Procedure: LAPAROSCOPIC LYSIS OF ADHESIONS;  Surgeon: Elspeth Schultze, MD;  Location: [facility];  Service: General;  Laterality: N/A;   LSCS      x2   NASAL SEPTUM SURGERY     NECK SURGERY     fused   TONSILLECTOMY     TRANSURETHRAL RESECTION OF BLADDER TUMOR N/A 11/16/2023   Procedure: TURBT (TRANSURETHRAL RESECTION OF BLADDER TUMOR);  Surgeon:  [name] Lonni Righter, MD;  Location: [facility];  Service: Urology;  Laterality: N/A;   TYMPANOSTOMY TUBE PLACEMENT Right 2024   ULNAR NERVE TRANSPOSITION  01/23/2012   Procedure: ULNAR NERVE DECOMPRESSION/TRANSPOSITION;  Surgeon: Reyes JONETTA Budge, MD;  Location: [facility] OR[facility];  Service: Neurosurgery;  Laterality: Left;  LEFT ulnar nerve decompression     Social History:   reports that she quit smoking about 4 years ago. Her smoking use included cigarettes. She started smoking about 34 years ago. She has a 15 pack-year smoking history. She has never used smokeless tobacco. She reports current alcohol use of about 2.0 standard drinks of alcohol per week. She reports that she does not use drugs.   Family History:  Her family history includes Alcohol abuse in her father; Cancer in an other family member; Heart attack in her father, mother, and another family member; Heart failure in an other family member;  Hypertension in her father and mother. There is no history of Anesthesia problems, Hypotension, Malignant hyperthermia, Pseudochol deficiency, or Breast cancer.   Allergies Allergies  Allergen Reactions   Lyrica  [Pregabalin ] Other (See Comments)    Jerking, twitching, memory changes      Home Medications  Prior to Admission medications   Medication Sig Start Date End Date Taking? Authorizing Provider  esomeprazole  (NEXIUM ) 20 MG capsule TAKE ONE (1) CAPSULE BY MOUTH ONCE DAILY 02/24/24  Yes Cox, Kirsten, MD  methadone  (DOLOPHINE ) 10 MG tablet Take 1 tablet (10 mg total) by mouth every 8 (eight) hours. Patient taking differently: Take 10 mg by mouth every 8 (eight) hours as needed (for pain). 06/14/24  Yes Pickenpack-Cousar, Fannie SAILOR, NP  ondansetron  (ZOFRAN ) 8 MG tablet Take 1 tablet (8 mg total) by mouth every 8 (eight) hours as needed for nausea or vomiting. 12/01/23  Yes Tina Pauletta BROCKS, MD  Oxycodone  HCl 20 MG TABS Take 1 tablet (20 mg total) by mouth every 4 (four) hours as needed. Patient taking differently: Take 20 mg by mouth every 4 (four) hours as needed (for pain). 07/04/24  Yes Pickenpack-Cousar, Fannie SAILOR, NP  ARIPiprazole  (ABILIFY ) 5 MG tablet TAKE 1 TABLET BY MOUTH DAILY 10/07/23   Cox, Kirsten, MD  aspirin  81 MG tablet Take 81 mg by mouth daily.    [provider]  BREO ELLIPTA  100-25 MCG/ACT AEPB INHALE ONE (1) PUFF BY MOUTH DAILY 05/25/24   Teressa Harrie HERO, FNP  Carboxymethylcell-Glycerin  PF 0.5-0.9 % SOLN Place 2 drops into both eyes 4 (four) times daily. Patient not taking: Reported on 07/07/2024 12/01/23   Tina Pauletta BROCKS, MD  carvedilol  (COREG ) 3.125 MG tablet TAKE ONE (1) TABLET BY MOUTH TWICE DAILY WITH MEALS Patient taking differently: Take 3.125 mg by mouth 2 (two) times daily with a meal. 11/26/23   Teressa Harrie HERO, FNP  denosumab  (PROLIA ) 60 MG/ML SOSY injection Inject 60 mg into the skin every 6 (six) months. 02/14/20   Abran Jerilynn Loving, MD  dextromethorphan  15  MG/5ML syrup Take 10 mLs (30 mg total) by mouth 4 (four) times daily as needed for cough. 10/22/23   Teressa Harrie HERO, FNP  diclofenac  Sodium (VOLTAREN ) 1 % GEL Apply 2 g topically 4 (four) times daily. 05/01/21   Abran Jerilynn Loving, MD  diphenoxylate -atropine  (LOMOTIL ) 2.5-0.025 MG tablet Take 1 tablet by mouth 4 (four) times daily as needed for diarrhea or loose stools. 07/04/24   Pickenpack-Cousar, Athena N, NP  dronabinol  (MARINOL ) 5 MG capsule Take 1 capsule (5 mg total) by mouth 2 (two)  times daily before lunch and supper. Patient not taking: No sig reported 03/15/24   Pickenpack-Cousar, Fannie SAILOR, NP  ENTRESTO  24-26 MG TAKE ONE (1) TABLET BY MOUTH TWICE DAILY 11/26/23   Teressa Harrie HERO, FNP  EPINEPHRINE  0.3 mg/0.3 mL IJ SOAJ injection Inject 0.3 mLs (0.3 mg total) into the muscle as needed for anaphylaxis. Patient not taking: Reported on 07/07/2024 04/27/20   Abran Jerilynn Loving, MD  FARXIGA 5 MG TABS tablet Take 5 mg by mouth daily. 10/10/23   [provider]  ferrous sulfate  325 (65 FE) MG tablet Take 1 tablet (325 mg total) by mouth at bedtime. 11/24/23   Gherghe, Costin M, MD  fluticasone  (FLONASE ) 50 MCG/ACT nasal spray USE 2 SPRAYS IN EACH NOSTRILS DAILY AS NEEDED Patient not taking: Reported on 07/07/2024 07/18/23   Teressa Harrie HERO, FNP  folic acid  (FOLVITE ) 1 MG tablet Take 1 tablet (1 mg total) by mouth daily. Patient not taking: Reported on 07/07/2024 01/08/24   Tina Pauletta BROCKS, MD  furosemide  (LASIX ) 20 MG tablet Take 3 tablets (60 mg total) by mouth daily. 07/07/24   Krasowski, Robert J, MD  ipratropium-albuterol  (DUONEB) 0.5-2.5 (3) MG/3ML SOLN USE 1 VIAL VIA NEBULIZER EVERY 6 HOURS AS NEEDED FOR SHORTNESS OF BREATH Patient taking differently: Take 3 mLs by nebulization every 6 (six) hours as needed (SOB). 10/16/23   Teressa Harrie HERO, FNP  levocetirizine (XYZAL) 5 MG tablet Take 5 mg by mouth daily as needed for allergies. 12/13/19   [provider]  lidocaine  (LIDODERM ) 5 %  Place 1 patch onto the skin daily. Remove & Discard patch within 12 hours or as directed by MD Patient not taking: Reported on 07/07/2024 11/24/23   Trixie Nilda HERO, MD  lidocaine -prilocaine  (EMLA ) cream Apply to affected area once Patient not taking: Reported on 07/07/2024 12/01/23   Tina Pauletta BROCKS, MD  lubiprostone  (AMITIZA ) 24 MCG capsule TAKE 1 CAPSULE(24 MCG) BY MOUTH TWICE DAILY WITH A MEAL Patient taking differently: Take 24 mcg by mouth 2 (two) times daily with a meal. 09/15/23   Cox, Kirsten, MD  methadone  (DOLOPHINE ) 5 MG tablet Take 1 tablet (5 mg total) by mouth every 8 (eight) hours. (Take in addition to 10mg  (Total 15mg )). 07/01/24   Pickenpack-Cousar, Athena N, NP  methylPREDNISolone  (MEDROL  DOSEPAK) 4 MG TBPK tablet If significant rash, will start at 6 tabs on day 1, then 5 tabs on day 2, then 4 tabs on day 3, then 3 tabs on day 4, then 2 tabs on day 5 and then 1 tab on day 6. Take with food. 12/25/23   Tina Pauletta BROCKS, MD  metoCLOPramide  (REGLAN ) 10 MG tablet Take 1 tablet (10 mg total) by mouth every 8 (eight) hours as needed for nausea. 01/07/24   Pickenpack-Cousar, Athena N, NP  Multiple Vitamin (MULTIVITAMIN WITH MINERALS) TABS tablet Take 1 tablet by mouth daily. 10/30/20   Sebastian Toribio GAILS, MD  naloxone  (NARCAN ) nasal spray 4 mg/0.1 mL Place 1 spray into the nose once. 01/12/21   [provider]  nitroGLYCERIN  (NITROSTAT ) 0.4 MG SL tablet Place 1 tablet (0.4 mg total) under the tongue every 5 (five) minutes as needed for chest pain. 11/27/21   Krasowski, Robert J, MD  ondansetron  (ZOFRAN ) 4 MG tablet Take 1 tablet (4 mg total) by mouth every 8 (eight) hours as needed for nausea or vomiting. Patient not taking: Reported on 07/08/2024 05/17/20   Abran Jerilynn Loving, MD  potassium chloride  (KLOR-CON  M) 10 MEQ tablet Take 2  tablets by mouth twice daily for 3 days, then 1 tablet once daily 06/29/24   Tina Pauletta BROCKS, MD  prochlorperazine  (COMPAZINE ) 10 MG tablet TAKE 1 TABLET(10  MG) BY MOUTH EVERY 6 HOURS AS NEEDED FOR NAUSEA OR VOMITING 04/27/24   Tina Pauletta BROCKS, MD  ranolazine  (RANEXA ) 500 MG 12 hr tablet TAKE 1 TABLET BY MOUTH TWICE DAILY 10/28/23   Krasowski, Robert J, MD  rosuvastatin  (CRESTOR ) 20 MG tablet TAKE 1 TABLET BY MOUTH ONCE DAILY 02/24/24   Cox, Kirsten, MD  sertraline  (ZOLOFT ) 100 MG tablet TAKE 1/2 TABLET(50 MG) BY MOUTH DAILY Patient taking differently: Take 50 mg by mouth daily. 08/24/23   Cox, Abigail, MD  sulfamethoxazole -trimethoprim  (BACTRIM  DS) 800-160 MG tablet Take 1 tablet by mouth 2 (two) times daily. 02/05/24   Tina Pauletta BROCKS, MD  tiZANidine  (ZANAFLEX ) 4 MG tablet Take 1 tablet (4 mg total) by mouth at bedtime. Patient not taking: Reported on 07/07/2024 06/07/24   Pickenpack-Cousar, Athena N, NP  topiramate  (TOPAMAX ) 50 MG tablet TAKE TWO (2) TABLETS BY MOUTH EVERY DAY AT BEDTIME Patient not taking: Reported on 07/07/2024 11/26/23   Teressa Harrie HERO, FNP  Ubrogepant  (UBRELVY ) 100 MG TABS Take 1 tablet (100 mg total) by mouth as needed (May repeat after 2 hours.  Maximum 2 tablets in 24 hours). TAKE 1 TABLET BY MOUTH BY MOUTH AS NEEDED( MAY REPEAT 1 TABLET AFTER 2 HOURS IF NEEDED, MAXIMUM 2 TABLETS IN 24 HOURS) Strength: 100 mg Patient not taking: Reported on 07/07/2024 04/07/23   Cox, Abigail, MD  Vitamin D , Ergocalciferol , (DRISDOL ) 1.25 MG (50000 UNIT) CAPS capsule TAKE 1 CAPSULE BY MOUTH EVERY 7 DAYS FOR 12 DOSES Patient taking differently: Take 50,000 Units by mouth every 7 (seven) days. Patient usually takes this medication on mondays 06/30/23   Sherre Abigail, MD     Critical care time: 60 min      CRITICAL CARE Performed by: Ronnald FORBES Gave   Total critical care time: 60 minutes  Critical care time was exclusive of separately billable procedures and treating other patients. Critical care was necessary to treat or prevent imminent or life-threatening deterioration.  Critical care was time spent personally by me on the following activities:  development of treatment plan with patient and/or surrogate as well as nursing, discussions with consultants, evaluation of patient's response to treatment, examination of patient, obtaining history from patient or surrogate, ordering and performing treatments and interventions, ordering and review of laboratory studies, ordering and review of radiographic studies, pulse oximetry and re-evaluation of patient's condition.   Ronnald Gave MSN, AGACNP-BC [place] Pulmonary/Critical Care Medicine Amion for pager  07/09/2024, 8:31 AM

## 2024-07-09 NOTE — Procedures (Signed)
 Arterial Catheter Insertion Procedure Note  [name]  [id number]  1956-04-19  Date:07/09/24  Time:2:36 PM    Provider Performing: Ronnald FORBES Gave    Procedure: Insertion of Arterial Line (63379) with US  guidance (23062)   Indication(s) Blood pressure monitoring and/or need for frequent ABGs  Consent Risks of the procedure as well as the alternatives and risks of each were explained to the patient and/or caregiver.  Consent for the procedure was obtained and is signed in the bedside chart  Anesthesia None   Time Out Verified patient identification, verified procedure, site/side was marked, verified correct patient position, special equipment/implants available, medications/allergies/relevant history reviewed, required imaging and test results available.   Sterile Technique Maximal sterile technique including full sterile barrier drape, hand hygiene, sterile gown, sterile gloves, mask, hair covering, sterile ultrasound probe cover (if used).   Procedure Description Area of catheter insertion was cleaned with chlorhexidine  and draped in sterile fashion. With real-time ultrasound guidance an arterial catheter was placed into the right femoral artery.  Appropriate arterial tracings confirmed on monitor.     Complications/Tolerance None; patient tolerated the procedure well.   EBL Minimal   Specimen(s) None    Ronnald Gave MSN, AGACNP-BC [facility] Pulmonary/Critical Care Medicine Amion for pager  07/09/2024, 2:36 PM

## 2024-07-09 NOTE — Progress Notes (Signed)
 RT attempted to place ordered arterial line- unsuccessful (CCM aware),

## 2024-07-09 NOTE — Progress Notes (Signed)
 Initial Nutrition Assessment  DOCUMENTATION CODES:   Severe malnutrition in context of chronic illness  INTERVENTION:  - If patient to remain intubated over the next 24-48 hours, would recommend initiation of tube feeds if medically appropriate.  Vital 1.2 at 55 ml/h (1320 ml per day) *Would recommend starting at 21mL/hr and advancing by 10mL Q12H Provides 1584 kcal, 99 gm protein, 1070 ml free water  daily  - Monitor magnesium , potassium, and phosphorus daily for at least 3 days, MD to replete as needed, as pt is at risk for refeeding syndrome given severe malnutrition.   NUTRITION DIAGNOSIS:   Severe Malnutrition related to chronic illness (bladder cancer with bone mets) as evidenced by moderate fat depletion, severe muscle depletion, percent weight loss (21% in 9 months).  GOAL:   Patient will meet greater than or equal to 90% of their needs  MONITOR:   Vent status, Labs, Weight trends  REASON FOR ASSESSMENT:   Ventilator    ASSESSMENT:   68 yo F PMH bladder cancer with bone mets, chronic pain on methadone , HFrEF, COPD who was admitted after presenting with hematemesis and diarrhea.   11/13 Admit 11/14 Intubated  Patient is currently intubated on ventilator support MV: 8.4 L/min Temp (24hrs), Avg:97.9 F (36.6 C), Min:97.4 F (36.3 C), Max:98.1 F (36.7 C)  No family or visitors at bedside at time of visit.  Per EMR, patient weighed at 152# in February and has since continued to lose weight to current weight of 120#. This is a 32# or 21% weight loss in 9 months, which is significant and severe for the time frame.    Per discussion with CCM, no plan for tube feeds today. Patient starting a second pressors this morning. Will leave tube feeds recs.   Medications reviewed and include: Colace, Miralax  Precedex Levophed  @ 20mcg/min Vasopressin @ 0.03 units/min  Labs reviewed:  Creatinine 1.25 Phosphorus 5.0   NUTRITION - FOCUSED PHYSICAL EXAM:  Flowsheet  Row Most Recent Value  Orbital Region Moderate depletion  Upper Arm Region Moderate depletion  Thoracic and Lumbar Region Unable to assess  Buccal Region Unable to assess  Temple Region Severe depletion  Clavicle Bone Region Moderate depletion  Clavicle and Acromion Bone Region Severe depletion  Scapular Bone Region Unable to assess  Dorsal Hand Mild depletion  Patellar Region Mild depletion  Anterior Thigh Region Mild depletion  Posterior Calf Region No depletion  Edema (RD Assessment) None  Hair Reviewed  Eyes Unable to assess  Mouth Unable to assess  Skin Reviewed  Nails Reviewed    Diet Order:   Diet Order             Diet NPO time specified  Diet effective now                   EDUCATION NEEDS:  Not appropriate for education at this time  Skin:  Skin Assessment: Reviewed RN Assessment  Last BM:  PTA  Height:  Ht Readings from Last 1 Encounters:  07/09/24 5' (1.524 m)   Weight:  Wt Readings from Last 1 Encounters:  07/08/24 54.4 kg   Ideal Body Weight:  45.45 kg  BMI:  Body mass index is 23.44 kg/m.  Estimated Nutritional Needs:  Kcal:  1550-1750 kcals Protein:  80-100 grams Fluid:  >/= 1.6L    Trude Ned RD, LDN Contact via Secure Chat.

## 2024-07-09 NOTE — IPAL (Addendum)
  Interdisciplinary Goals of Care Family Meeting   Date carried out: 07/09/2024  Location of the meeting: Conference room  Member's involved: Nurse Practitioner and Family Member or next of kin  Durable Power of Attorney or environmental health practitioner: on admission, pt indicated  her fiance to [facility].   Discussion: We discussed goals of care for [name] .  [name] has had a progressive decline over the course of the day-- now on 3 pressors, MSOF w concern for worsening cardiogenic shock. With her hemodynamic instability, she is not a reasonable candidate for CRRT to address her acidosis at this point.  We discussed code status. Pt fiance says that he will agree with family. I talked to numerous family members including daughter (NOK).  Decision reached to continue all current measures, but in event of arrest will not resuscitate.   Code status:   Code Status: Do not attempt resuscitation (DNR) PRE-ARREST INTERVENTIONS DESIRED   Disposition: Continue current acute care  Time spent for the meeting:    Crystal FORBES Gave, NP  07/09/2024, 3:59 PM

## 2024-07-09 NOTE — Progress Notes (Signed)
 Glide blade pulled from PYXIS and placed in Glide intubation bag.

## 2024-07-09 NOTE — Consult Note (Incomplete)
 Urology Consult   Physician requesting consult: Will Bernard, MD   Reason for consult: right ureteral stent in place with mild hydronephrosis   History of Present Illness: [name] is a 68 y.o. female with PMH nonischemic cardiomyopathy EF 35%, AICD, HTN, COPD, and metastatic UCC with squamous differentiation undergoing chemotherapy (EV-pembro) and indwelling right ureteral stent placed in April 2025 for hydro 2/2 invasive bladder mass. She presented to [facility] ED with epigastric pain, nausea, emesis, and diarrhea. CT A/P revealed pan-colitis but no obstruction. Urology was consulted after CT also showed the proximal curl of her right ureteral stent to be in the proximal ureter with mild hydronephrosis.  Pt afebrile, tachycardic to 120s on arrival, normotensive.    Labs are notable for Cr at baseline 1.16. K 2.5. Mag 1.1. No leukocytosis. UA drawn from chronic indwelling foley with large leukocytes, >50 WBC, 6-10 RBC, few bacteria. Pt denies any flank pain and reports pain is epigastric.    Past Medical History:  Diagnosis Date  . Abnormality of gait due to impairment of balance 11/22/2020  . Admission for long-term opiate analgesic use 10/24/2019  . AKI (acute kidney injury) 11/14/2023  . Anemia of chronic disease 06/07/2023  . Arthralgia of both hands 04/07/2023  . Arthritis of right hip 05/29/2016   Formatting of this note might be different from the original. Added automatically from request for surgery [id number]  . Arthritis of right knee 11/27/2016  . At risk for side effect of medication 12/18/2023  . Bilateral lower extremity edema 06/07/2023  . BMI 26.0-26.9,adult 03/29/2020  . Bone lesion 11/18/2023  . Cardiomyopathy [facility])    Overview:  Ejection fraction 45% in 2015 Ejection fraction 30 to 35% in November 2018  . Chronic active hepatitis (HCC) 01/04/2023  . Chronic back pain   . Chronic hypoxemic respiratory failure (HCC) 10/24/2019  . Chronic narcotic use 03/23/2015  .  Chronic pain of both knees 12/21/2018   Added automatically from request for surgery [id number]  Formatting of this note might be different from the original. Added automatically from request for surgery [id number]  . Chronic systolic congestive heart failure, NYHA class 2 (HCC) 06/19/2017  . CKD (chronic kidney disease), stage III (HCC) 12/01/2023  . 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 Community acquired pneumonia of left lower lobe of lung 09/25/2023  . COPD (chronic obstructive pulmonary disease) (HCC)   . COPD exacerbation (HCC) 01/04/2023  . Coronary artery disease involving native coronary artery of native heart without angina pectoris 05/31/2015   Overview:  Abnormal stress test in fall of 2016, cardiac catheterization showed normal coronaries.  . Decreased appetite 12/18/2023  . Depression, major, recurrent, moderate (HCC) 10/24/2019  . Diarrhea 05/17/2020  . Dietary folate deficiency anemia 11/21/2023  . Dilated cardiomyopathy (HCC) 06/19/2017  . Diverticulosis   . 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 Dyslipidemia 05/31/2015  . Elevated serum glucose 06/23/2023  . Fatigue 06/23/2023  . Folate deficiency 01/08/2024  . GERD (gastroesophageal reflux disease)   . History of total hip replacement 09/25/2016  . Hypoaldosteronism 07/13/2020  . Hypotension 05/17/2020  . 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 Ileus following gastrointestinal surgery (HCC) 03/26/2015  . Iron deficiency 11/21/2023  . Low vitamin B12 level 11/21/2023  . Major depressive disorder, single episode, moderate (HCC) 10/24/2019  . Malnutrition of moderate degree 10/20/2020  . Mesenteric ischemia 10/16/2020  . Migraine without aura and without status migrainosus, not intractable 01/04/2023  .  Mixed hyperlipidemia 05/31/2015  . Mixed incontinence 10/20/2020  . Moderate protein-calorie malnutrition 11/03/2023  . Nausea  with vomiting   . Nausea without vomiting 01/29/2024  . Nephrostomy present (HCC) 12/01/2023  . Neuropathy 01/29/2024  . Normocytic anemia 06/23/2023  . Osteoporosis 10/24/2019  . Other spondylosis with radiculopathy, lumbar region 10/24/2019  . Paraplegia (HCC) 11/14/2023  . Presence of left artificial hip joint 10/24/2019  . Primary insomnia 01/04/2023  . PTSD (post-traumatic stress disorder)   . Renal lesion 11/05/2023  . Rheumatoid arthritis involving multiple sites (HCC) 04/07/2023  . Senile osteoporosis 10/24/2019  . Serosanguineous chronic otitis media of right ear 08/02/2021  . Therapeutic opioid induced constipation 01/04/2023  . Thyroid  disease   . Urge incontinence of urine 11/03/2023  . 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  . Vertebral fracture, pathological 12/01/2023  . Vitamin D  deficiency 09/25/2023    Past Surgical History:  Procedure Laterality Date  . APPENDECTOMY    . CARDIAC CATHETERIZATION    . CARPAL TUNNEL RELEASE    . CESAREAN SECTION    . CHF s/p AICD   12/2010  . CHOLECYSTECTOMY    . COLONOSCOPY  10/16/2015   Mild colonic diverticulosis, predominantly in the left colon. Small internal hemrrhoids. Otherwise normal colonoscopy  . COLONOSCOPY WITH PROPOFOL  N/A 09/27/2021   Procedure: COLONOSCOPY WITH PROPOFOL ;  Surgeon: Shila Gustav GAILS, MD;  Location: [facility] ENDOSCOPY;  Service: Endoscopy;  Laterality: N/A;  . CORONARY ANGIOPLASTY    . ESOPHAGOGASTRODUODENOSCOPY  02/04/2014   Mild gastritis. Otherwise normal EGD  . HAND SURGERY Bilateral   . ICD GENERATOR CHANGEOUT N/A 05/21/2019   Procedure: ICD GENERATOR CHANGEOUT;  Surgeon: Inocencio Soyla Lunger, MD;  Location: [facility] INVASIVE CV LAB;  Service: Cardiovascular;  Laterality: N/A;  . ICD IMPLANT     [employer]  . intestinal blockage 2011    . IR IMAGING GUIDED PORT INSERTION  12/24/2023  . IR NEPHRO TUBE REMOV/FL  06/02/2024  . IR NEPHROSTOMY PLACEMENT RIGHT  11/16/2023  . IR URETERAL STENT  PLACEMENT EXISTING ACCESS RIGHT  12/24/2023  . LAPAROSCOPIC ASSISTED VENTRAL HERNIA REPAIR N/A 03/23/2015   Procedure: LAPAROSCOPIC VENTRAL WALL HERNIA REPAIR;  Surgeon: Elspeth Schultze, MD;  Location: [facility] ORS;  Service: General;  Laterality: N/A;  With MESH  . LAPAROSCOPIC LYSIS OF ADHESIONS N/A 03/23/2015   Procedure: LAPAROSCOPIC LYSIS OF ADHESIONS;  Surgeon: Elspeth Schultze, MD;  Location: [facility] ORS;  Service: General;  Laterality: N/A;  . LSCS      x2  . NASAL SEPTUM SURGERY    . NECK SURGERY     fused  . TONSILLECTOMY    . TRANSURETHRAL RESECTION OF BLADDER TUMOR N/A 11/16/2023   Procedure: TURBT (TRANSURETHRAL RESECTION OF BLADDER TUMOR);  Surgeon: [name] Lonni Righter, MD;  Location: [facility] ORS;  Service: Urology;  Laterality: N/A;  . TYMPANOSTOMY TUBE PLACEMENT Right 2024  . ULNAR NERVE TRANSPOSITION  01/23/2012   Procedure: ULNAR NERVE DECOMPRESSION/TRANSPOSITION;  Surgeon: Reyes JONETTA Budge, MD;  Location: [facility] NEURO ORS;  Service: Neurosurgery;  Laterality: Left;  LEFT ulnar nerve decompression     Current Hospital Medications:  Home meds:  No current facility-administered medications on file prior to encounter.   Current Outpatient Medications on File Prior to Encounter  Medication Sig Dispense Refill  . esomeprazole  (NEXIUM ) 20 MG capsule TAKE ONE (1) CAPSULE BY MOUTH ONCE DAILY 90 capsule 3  . methadone  (DOLOPHINE ) 10 MG tablet Take 1 tablet (10 mg total) by mouth every 8 (eight) hours. (Patient taking differently:  Take 10 mg by mouth every 8 (eight) hours as needed (for pain).) 75 tablet 0  . ondansetron  (ZOFRAN ) 8 MG tablet Take 1 tablet (8 mg total) by mouth every 8 (eight) hours as needed for nausea or vomiting. 30 tablet 1  . Oxycodone  HCl 20 MG TABS Take 1 tablet (20 mg total) by mouth every 4 (four) hours as needed. (Patient taking differently: Take 20 mg by mouth every 4 (four) hours as needed (for pain).) 90 tablet 0  . ARIPiprazole  (ABILIFY ) 5 MG tablet TAKE 1 TABLET BY  MOUTH DAILY 30 tablet 10  . aspirin  81 MG tablet Take 81 mg by mouth daily.    . BREO ELLIPTA  100-25 MCG/ACT AEPB INHALE ONE (1) PUFF BY MOUTH DAILY 60 each 11  . Carboxymethylcell-Glycerin  PF 0.5-0.9 % SOLN Place 2 drops into both eyes 4 (four) times daily. (Patient not taking: Reported on 07/07/2024) 1 each 11  . carvedilol  (COREG ) 3.125 MG tablet TAKE ONE (1) TABLET BY MOUTH TWICE DAILY WITH MEALS (Patient taking differently: Take 3.125 mg by mouth 2 (two) times daily with a meal.) 60 tablet 10  . denosumab  (PROLIA ) 60 MG/ML SOSY injection Inject 60 mg into the skin every 6 (six) months. 180 mL 2  . dextromethorphan  15 MG/5ML syrup Take 10 mLs (30 mg total) by mouth 4 (four) times daily as needed for cough. 120 mL 0  . diclofenac  Sodium (VOLTAREN ) 1 % GEL Apply 2 g topically 4 (four) times daily. 50 g 4  . diphenoxylate -atropine  (LOMOTIL ) 2.5-0.025 MG tablet Take 1 tablet by mouth 4 (four) times daily as needed for diarrhea or loose stools. 60 tablet 0  . dronabinol  (MARINOL ) 5 MG capsule Take 1 capsule (5 mg total) by mouth 2 (two) times daily before lunch and supper. (Patient not taking: No sig reported) 60 capsule 0  . ENTRESTO  24-26 MG TAKE ONE (1) TABLET BY MOUTH TWICE DAILY 60 tablet 10  . EPINEPHRINE  0.3 mg/0.3 mL IJ SOAJ injection Inject 0.3 mLs (0.3 mg total) into the muscle as needed for anaphylaxis. (Patient not taking: Reported on 07/07/2024) 1 each 2  . FARXIGA 5 MG TABS tablet Take 5 mg by mouth daily.    . ferrous sulfate  325 (65 FE) MG tablet Take 1 tablet (325 mg total) by mouth at bedtime.    . fluticasone  (FLONASE ) 50 MCG/ACT nasal spray USE 2 SPRAYS IN EACH NOSTRILS DAILY AS NEEDED (Patient not taking: Reported on 07/07/2024) 48 g 0  . folic acid  (FOLVITE ) 1 MG tablet Take 1 tablet (1 mg total) by mouth daily. (Patient not taking: Reported on 07/07/2024)    . furosemide  (LASIX ) 20 MG tablet Take 3 tablets (60 mg total) by mouth daily. 270 tablet 1  . ipratropium-albuterol   (DUONEB) 0.5-2.5 (3) MG/3ML SOLN USE 1 VIAL VIA NEBULIZER EVERY 6 HOURS AS NEEDED FOR SHORTNESS OF BREATH (Patient taking differently: Take 3 mLs by nebulization every 6 (six) hours as needed (SOB).) 90 mL 0  . levocetirizine (XYZAL) 5 MG tablet Take 5 mg by mouth daily as needed for allergies.    . lidocaine  (LIDODERM ) 5 % Place 1 patch onto the skin daily. Remove & Discard patch within 12 hours or as directed by MD (Patient not taking: Reported on 07/07/2024)    . lidocaine -prilocaine  (EMLA ) cream Apply to affected area once (Patient not taking: Reported on 07/07/2024) 30 g 3  . lubiprostone  (AMITIZA ) 24 MCG capsule TAKE 1 CAPSULE(24 MCG) BY MOUTH TWICE DAILY WITH A MEAL (Patient  taking differently: Take 24 mcg by mouth 2 (two) times daily with a meal.) 180 capsule 1  . methadone  (DOLOPHINE ) 5 MG tablet Take 1 tablet (5 mg total) by mouth every 8 (eight) hours. (Take in addition to 10mg  (Total 15mg )). 30 tablet 0  . methylPREDNISolone  (MEDROL  DOSEPAK) 4 MG TBPK tablet If significant rash, will start at 6 tabs on day 1, then 5 tabs on day 2, then 4 tabs on day 3, then 3 tabs on day 4, then 2 tabs on day 5 and then 1 tab on day 6. Take with food. 21 tablet 0  . metoCLOPramide  (REGLAN ) 10 MG tablet Take 1 tablet (10 mg total) by mouth every 8 (eight) hours as needed for nausea. 45 tablet 1  . Multiple Vitamin (MULTIVITAMIN WITH MINERALS) TABS tablet Take 1 tablet by mouth daily.    . naloxone  (NARCAN ) nasal spray 4 mg/0.1 mL Place 1 spray into the nose once.    . nitroGLYCERIN  (NITROSTAT ) 0.4 MG SL tablet Place 1 tablet (0.4 mg total) under the tongue every 5 (five) minutes as needed for chest pain. 25 tablet 6  . ondansetron  (ZOFRAN ) 4 MG tablet Take 1 tablet (4 mg total) by mouth every 8 (eight) hours as needed for nausea or vomiting. (Patient not taking: Reported on 07/08/2024) 20 tablet 0  . potassium chloride  (KLOR-CON  M) 10 MEQ tablet Take 2 tablets by mouth twice daily for 3 days, then 1 tablet  once daily 90 tablet 0  . prochlorperazine  (COMPAZINE ) 10 MG tablet TAKE 1 TABLET(10 MG) BY MOUTH EVERY 6 HOURS AS NEEDED FOR NAUSEA OR VOMITING 30 tablet 3  . ranolazine  (RANEXA ) 500 MG 12 hr tablet TAKE 1 TABLET BY MOUTH TWICE DAILY 60 tablet 10  . rosuvastatin  (CRESTOR ) 20 MG tablet TAKE 1 TABLET BY MOUTH ONCE DAILY 90 tablet 1  . sertraline  (ZOLOFT ) 100 MG tablet TAKE 1/2 TABLET(50 MG) BY MOUTH DAILY (Patient taking differently: Take 50 mg by mouth daily.) 45 tablet 1  . sulfamethoxazole -trimethoprim  (BACTRIM  DS) 800-160 MG tablet Take 1 tablet by mouth 2 (two) times daily. 14 tablet 0  . tiZANidine  (ZANAFLEX ) 4 MG tablet Take 1 tablet (4 mg total) by mouth at bedtime. (Patient not taking: Reported on 07/07/2024) 30 tablet 3  . topiramate  (TOPAMAX ) 50 MG tablet TAKE TWO (2) TABLETS BY MOUTH EVERY DAY AT BEDTIME (Patient not taking: Reported on 07/07/2024) 60 tablet 10  . Ubrogepant  (UBRELVY ) 100 MG TABS Take 1 tablet (100 mg total) by mouth as needed (May repeat after 2 hours.  Maximum 2 tablets in 24 hours). TAKE 1 TABLET BY MOUTH BY MOUTH AS NEEDED( MAY REPEAT 1 TABLET AFTER 2 HOURS IF NEEDED, MAXIMUM 2 TABLETS IN 24 HOURS) Strength: 100 mg (Patient not taking: Reported on 07/07/2024) 48 tablet 1  . Vitamin D , Ergocalciferol , (DRISDOL ) 1.25 MG (50000 UNIT) CAPS capsule TAKE 1 CAPSULE BY MOUTH EVERY 7 DAYS FOR 12 DOSES (Patient taking differently: Take 50,000 Units by mouth every 7 (seven) days. Patient usually takes this medication on mondays) 12 capsule 0     Scheduled Meds: . [START ON 07/09/2024] carvedilol   3.125 mg Oral BID WC  . methadone   5 mg Oral Q8H  . [START ON 07/09/2024] sertraline   50 mg Oral Daily   Continuous Infusions: . cefTRIAXone  (ROCEPHIN )  IV 2 g (07/08/24 1952)  . metronidazole  500 mg (07/08/24 2038)   PRN Meds:.acetaminophen  **OR** acetaminophen , ondansetron  (ZOFRAN ) IV, prochlorperazine   Allergies:  Allergies  Allergen Reactions  . Lyrica  [  Pregabalin ] Other  (See Comments)    Jerking, twitching, memory changes     Family History  Problem Relation Age of Onset  . Hypertension Mother   . Heart attack Mother   . Alcohol abuse Father   . Hypertension Father   . Heart attack Father   . Heart attack Other   . Cancer Other   . Heart failure Other   . Anesthesia problems Neg Hx   . Hypotension Neg Hx   . Malignant hyperthermia Neg Hx   . Pseudochol deficiency Neg Hx   . Breast cancer Neg Hx     Social History:  reports that she quit smoking about 4 years ago. Her smoking use included cigarettes. She started smoking about 34 years ago. She has a 15 pack-year smoking history. She has never used smokeless tobacco. She reports current alcohol use of about 2.0 standard drinks of alcohol per week. She reports that she does not use drugs.  ROS: A complete review of systems was performed.  All systems are negative except for pertinent findings as noted.  Physical Exam:  Vital signs in last 24 hours: Temp:  [97.4 F (36.3 C)-98 F (36.7 C)] 98 F (36.7 C) (11/13 2210) Pulse Rate:  [80-133] 133 (11/13 2200) Resp:  [14-22] 20 (11/13 2145) BP: (106-136)/(72-93) 116/91 (11/13 2200) SpO2:  [100 %] 100 % (11/13 2200) Weight:  [54.4 kg] 54.4 kg (11/13 1719) Constitutional:  Alert and oriented, ill-appearing  Cardiovascular: Regular rate and rhythm Respiratory: Normal respiratory effort, Lungs clear bilaterally GI: Abdomen is soft, epigastric TTP, nondistended GU: No CVA tenderness Neurologic: Grossly intact  Laboratory Data:  Recent Labs    07/08/24 1117  WBC 10.5  HGB 9.8*  HCT 29.8*  PLT 240    Recent Labs    07/08/24 1117  NA 143  K 2.5*  CL 107  GLUCOSE 108*  BUN 16  CALCIUM  8.8*  CREATININE 1.16*     Results for orders placed or performed during the hospital encounter of 07/08/24 (from the past 24 hours)  Urinalysis, Routine w reflex microscopic -Urine, Clean Catch     Status: Abnormal   Collection Time: 07/08/24 11:13  AM  Result Value Ref Range   Color, Urine YELLOW YELLOW   APPearance CLOUDY (A) CLEAR   Specific Gravity, Urine 1.012 1.005 - 1.030   pH 8.0 5.0 - 8.0   Glucose, UA NEGATIVE NEGATIVE mg/dL   Hgb urine dipstick SMALL (A) NEGATIVE   Bilirubin Urine NEGATIVE NEGATIVE   Ketones, ur NEGATIVE NEGATIVE mg/dL   Protein, ur 899 (A) NEGATIVE mg/dL   Nitrite NEGATIVE NEGATIVE   Leukocytes,Ua LARGE (A) NEGATIVE   RBC / HPF 6-10 0 - 5 RBC/hpf   WBC, UA >50 0 - 5 WBC/hpf   Bacteria, UA FEW (A) NONE SEEN   Squamous Epithelial / HPF 0-5 0 - 5 /HPF   WBC Clumps PRESENT    Mucus PRESENT   CBC with Differential     Status: Abnormal   Collection Time: 07/08/24 11:17 AM  Result Value Ref Range   WBC 10.5 4.0 - 10.5 K/uL   RBC 3.29 (L) 3.87 - 5.11 MIL/uL   Hemoglobin 9.8 (L) 12.0 - 15.0 g/dL   HCT 70.1 (L) 63.9 - 53.9 %   MCV 90.6 80.0 - 100.0 fL   MCH 29.8 26.0 - 34.0 pg   MCHC 32.9 30.0 - 36.0 g/dL   RDW 82.9 (H) 88.4 - 84.4 %   Platelets 240 150 -  400 K/uL   nRBC 0.0 0.0 - 0.2 %   Neutrophils Relative % 79 %   Neutro Abs 8.3 (H) 1.7 - 7.7 K/uL   Lymphocytes Relative 9 %   Lymphs Abs 1.0 0.7 - 4.0 K/uL   Monocytes Relative 5 %   Monocytes Absolute 0.5 0.1 - 1.0 K/uL   Eosinophils Relative 4 %   Eosinophils Absolute 0.4 0.0 - 0.5 K/uL   Basophils Relative 2 %   Basophils Absolute 0.2 (H) 0.0 - 0.1 K/uL   Immature Granulocytes 1 %   Abs Immature Granulocytes 0.13 (H) 0.00 - 0.07 K/uL  Comprehensive metabolic panel     Status: Abnormal   Collection Time: 07/08/24 11:17 AM  Result Value Ref Range   Sodium 143 135 - 145 mmol/L   Potassium 2.5 (LL) 3.5 - 5.1 mmol/L   Chloride 107 98 - 111 mmol/L   CO2 21 (L) 22 - 32 mmol/L   Glucose, Bld 108 (H) 70 - 99 mg/dL   BUN 16 8 - 23 mg/dL   Creatinine, Ser 8.83 (H) 0.44 - 1.00 mg/dL   Calcium  8.8 (L) 8.9 - 10.3 mg/dL   Total Protein 6.1 (L) 6.5 - 8.1 g/dL   Albumin  3.2 (L) 3.5 - 5.0 g/dL   AST 15 15 - 41 U/L   ALT <5 0 - 44 U/L   Alkaline  Phosphatase 53 38 - 126 U/L   Total Bilirubin 0.3 0.0 - 1.2 mg/dL   GFR, Estimated 50 (L) >60 mL/min   Anion gap 16 (H) 5 - 15  Lipase, blood     Status: None   Collection Time: 07/08/24 11:17 AM  Result Value Ref Range   Lipase 15 11 - 51 U/L  Protime-INR     Status: None   Collection Time: 07/08/24 11:17 AM  Result Value Ref Range   Prothrombin Time 14.3 11.4 - 15.2 seconds   INR 1.1 0.8 - 1.2  APTT     Status: None   Collection Time: 07/08/24 11:17 AM  Result Value Ref Range   aPTT 30 24 - 36 seconds  Magnesium      Status: Abnormal   Collection Time: 07/08/24 11:17 AM  Result Value Ref Range   Magnesium  1.1 (L) 1.7 - 2.4 mg/dL  Type and screen [facility]     Status: None   Collection Time: 07/08/24 11:17 AM  Result Value Ref Range   ABO/RH(D) O NEG    Antibody Screen NEG    Sample Expiration      07/11/2024,2359 Performed at [facility], [address] [place], [place] [address]   POC occult blood, ED     Status: None   Collection Time: 07/08/24 11:20 AM  Result Value Ref Range   Fecal Occult Bld NEGATIVE NEGATIVE   *Note: Due to a large number of results and/or encounters for the requested time period, some results have not been displayed. A complete set of results can be found in Results Review.   No results found for this or any previous visit (from the past 240 hours).  Renal Function: Recent Labs    07/08/24 1117  CREATININE 1.16*   Estimated Creatinine Clearance: 32.4 mL/min (A) (by C-G formula based on SCr of 1.16 mg/dL (H)).  Radiologic Imaging: CT ABDOMEN PELVIS W CONTRAST Result Date: 07/08/2024 CLINICAL DATA:  Abdominal pain, nausea and vomiting. Bladder cancer. EXAM: CT ABDOMEN AND PELVIS WITH CONTRAST TECHNIQUE: Multidetector CT imaging of the abdomen and  pelvis was performed using the standard protocol following bolus administration of intravenous contrast. RADIATION DOSE REDUCTION: This exam was performed  according to the departmental dose-optimization program which includes automated exposure control, adjustment of the mA and/or kV according to patient size and/or use of iterative reconstruction technique. CONTRAST:  80mL OMNIPAQUE  IOHEXOL  300 MG/ML  SOLN COMPARISON:  Pelvic CT dated 11/17/2023. FINDINGS: Evaluation of this exam is limited due to respiratory motion and streak artifact caused by patient's arms and pacemaker. Lower chest: There is eventration of the right hemidiaphragm with right lung base atelectasis. Pneumonia is not excluded. Cardiac pacemaker noted. No intra-abdominal free air or free fluid. Hepatobiliary: The liver is unremarkable. There is mild dilatation, post cholecystectomy. No retained calcified stone noted in the central CBD. Pancreas: Unremarkable. No pancreatic ductal dilatation or surrounding inflammatory changes. Spleen: Normal in size without focal abnormality. Adrenals/Urinary Tract: The adrenal glands unremarkable. Small bilateral renal cysts and subcentimeter hypodense lesions which are too small to characterize. There is a mild right hydronephrosis. Right pigtail ureteral stent with proximal tip in the proximal right ureter distal end in the urinary bladder. No hydronephrosis on the left. There is delayed excretion of contrast by the right kidney. The urinary bladder is decompressed around a Foley catheter. Stomach/Bowel: There is diffuse thickened appearance of the colon consistent with pancolitis. There is no bowel obstruction. Appendectomy. Vascular/Lymphatic: Mild aortoiliac atherosclerotic disease. The IVC is unremarkable. No portal venous gas. No adenopathy. Reproductive: The uterus is poorly visualized and not evaluated. Other: None Musculoskeletal: Osteopenia with degenerative changes of spine. Total right hip arthroplasty with associated streak artifact limiting evaluation the pelvic structures. No acute osseous pathology. IMPRESSION: 1. Pancolitis. No bowel obstruction.  2. Mild right hydronephrosis. Right pigtail ureteral stent with proximal tip in the proximal right ureter. 3.  Aortic Atherosclerosis (ICD10-I70.0). Electronically Signed   By: Vanetta Chou M.D.   On: 07/08/2024 14:50    I independently reviewed the above imaging studies.  Impression/Recommendation: Pt is a 68 yo female with PMH nonischemic cardiomyopathy EF 35%, AICD, HTN, COPD, and metastatic UCC with squamous differentiation undergoing chemotherapy (EV-pembro) and indwelling right ureteral stent placed in April 2025 for hydro 2/2 invasive bladder mass.   Patient's current presentation is most consistent with GI etiology, and her CT reveals likely pan-colitis. She may benefit from exchange of her indwelling ureteral stent, but given stable renal function, lack of flank pain or new urinary symptoms, and UA likely reflective of colonization of chronic foley, I do not believe her stent requires urgent replacement. Further, she is a poor anesthesia candidate,

## 2024-07-09 NOTE — ED Provider Notes (Signed)
 Called to bedside for respiratory failure.  Admitted patient boarding in the emergency department.  Admitted for colitis.  Had been receiving some fluids, on initial assessment patient is with very poor color, seems poorly perfused, severely tachypneic, diffuse rhonchi.  Initial concern for flash pulmonary edema.  Started on BiPAP, chest x-ray with some evidence of edema.  EKG without significant ischemic findings.  Patient also exhibiting some hypotension, started on norepinephrine .  Patient without improvement on BiPAP, poorly tolerating the BiPAP.  Possibly becoming more somnolent on the BiPAP.  Decision made to intubate.  See procedural details below.  Hospitalist team to contact ICU.  SABRACritical Care  Performed by: Theadore Ozell HERO, MD Authorized by: Theadore Ozell HERO, MD   Critical care provider statement:    Critical care time (minutes):  35   Critical care was necessary to treat or prevent imminent or life-threatening deterioration of the following conditions:  Respiratory failure   Critical care was time spent personally by me on the following activities:  Development of treatment plan with patient or surrogate, discussions with consultants, evaluation of patient's response to treatment, examination of patient, ordering and review of laboratory studies, ordering and review of radiographic studies, ordering and performing treatments and interventions, pulse oximetry, re-evaluation of patient's condition and review of old charts Procedure Name: Intubation Date/Time: 07/09/2024 7:03 AM  Performed by: Theadore Ozell HERO, MDPre-anesthesia Checklist: Patient identified, Patient being monitored, Emergency Drugs available, Timeout performed and Suction available Oxygen  Delivery Method: Non-rebreather mask Preoxygenation: Pre-oxygenation with 100% oxygen  Induction Type: Rapid sequence Ventilation: Mask ventilation without difficulty Laryngoscope Size: Glidescope and 3 Grade View: Grade I Tube size: 7.5  mm Number of attempts: 1 Airway Equipment and Method: Rigid stylet Placement Confirmation: ETT inserted through vocal cords under direct vision, CO2 detector and Breath sounds checked- equal and bilateral Secured at: 21 cm Comments: 20 mg etomidate, 80 mg rocuronium         Theadore Ozell HERO, MD 07/09/24 [phone]

## 2024-07-09 NOTE — Progress Notes (Addendum)
     Patient Name: NDIA SAMPATH           DOB: 04/29/1956  MRN: [id number]      Admission Date: 07/08/2024  Attending Provider: Arthea Child, MD  Primary Diagnosis: Hematemesis   Level of care: ICU   OVERNIGHT EVENT  Notified by EDP of patient experiencing acute respiratory failure.   Chest x-ray obtained, fluids stopped, BiPAP started, ABG ordered.  BP fluctuating, dropping to SBP 70s.  Holding Lasix .   Patient is confused, unable to make medical decisions at this time.  Per admitting physician note, full code. Spoke with daughter over the phone and updated her regarding change in status.  She is in agreeance with FULL CODE STATUS and is okay with intubation if needed.  Patient intubated by EDP. PCCM [place], [place] notified.    Martin Smeal, DNP, ACNPC- AG [facility] [place]

## 2024-07-09 NOTE — Progress Notes (Signed)
 Pharmacy Antibiotic Note  [name] is a 68 y.o. female admitted on 07/08/2024 with pancolitis.  Pharmacy has been consulted for vancomycin  dosing for sepsis.  Plan: Begin vancomycin  1250 mg IV every 48 hours (Goal AUC 400-550, eAUC 543.4, SCr used: 1.25) Monitor clinical progress, renal function, vancomycin  levels as indicated F/U C&S, abx deescalation / LOT   Height: 5' (152.4 cm) Weight: 54.4 kg (120 lb) IBW/kg (Calculated) : 45.5  Temp (24hrs), Avg:97.9 F (36.6 C), Min:97.4 F (36.3 C), Max:98.1 F (36.7 C)  Recent Labs  Lab 07/08/24 1117 07/09/24 0030 07/09/24 0846 07/09/24 0929 07/09/24 0935  WBC 10.5  --  14.1*  --   --   CREATININE 1.16* 1.00 1.25*  --   --   LATICACIDVEN  --   --   --  8.0* 5.6*    Estimated Creatinine Clearance: 30.1 mL/min (A) (by C-G formula based on SCr of 1.25 mg/dL (H)).    Allergies  Allergen Reactions   Lyrica  [Pregabalin ] Other (See Comments)    Jerking, twitching, memory changes     Antimicrobials this admission: 11/13 Rocephin  x 1,  11/13 Flagyl  >> 11/14 11/14 azithromycin  >> 11/14 Zosyn  >> 11/14 vancomycin  >>  Microbiology results: 11/14 BCx: sent 11/13 UCx: multiple species present, suggest recollection  11/14 MRSA PCR: detected  Thank you for allowing pharmacy to be a part of this patient's care.  [name] Agent, PharmD, BCPS Clinical Pharmacist [place] 07/09/2024 1:02 PM

## 2024-07-09 NOTE — Procedures (Signed)
 Central Venous Catheter Insertion Procedure Note  CITLALLY CAPTAIN  [id number]  21-Mar-1956  Date:07/09/24  Time:2:37 PM   Provider Performing:Tonjia Parillo E Savanna Dooley   Procedure: Insertion of Non-tunneled Central Venous Catheter(36556) with US  guidance (23062)   Indication(s) Shock requiring vasoactive medication administration   Consent Unable to obtain consent due to emergent nature of procedure.  Anesthesia none  Timeout Verified patient identification, verified procedure, site/side was marked, verified correct patient position, special equipment/implants available, medications/allergies/relevant history reviewed, required imaging and test results available.  Sterile Technique Maximal sterile technique including full sterile barrier drape, hand hygiene, sterile gown, sterile gloves, mask, hair covering, sterile ultrasound probe cover (if used).  Procedure Description Area of catheter insertion was cleaned with chlorhexidine  and draped in sterile fashion.  With real-time ultrasound guidance a central venous catheter was placed into the right femoral vein. Positioning of the guidewire within the right femoral vein was verified with ultrasound prior to dilation of vessel and placement of central venous catheter. Nonpulsatile blood flow and easy flushing noted in all ports.  The catheter was sutured in place and sterile dressing applied.  Complications/Tolerance None; patient tolerated the procedure well.   EBL Minimal  Specimen(s) None    Ronnald Gave MSN, AGACNP-BC [facility] Pulmonary/Critical Care Medicine Amion for pager  07/09/2024, 2:38 PM

## 2024-07-09 NOTE — Progress Notes (Signed)
 eLink Physician-Brief Progress Note Patient Name: [name] DOB: [birth year] MRN: [id number]   Date of Service  07/09/2024  HPI/Events of Note  Follow up labs: Troponin 655, Lactic acid  9.0 (unchanged from baseline this morning). Cariology note reviewed in detail referencing cardiogenic nature of her shock, she reacted adversely to trial of Epinephrine  gtt and it was aborted, echo suggestive of stress cardiomyopathy. She is not a candidate for Heparinization due to recent hematemesis.  eICU Interventions  Will defer to cardiology regarding further treatment.        Lafe Clerk U Lurleen Soltero 07/09/2024, 11:20 PM

## 2024-07-10 DIAGNOSIS — A419 Sepsis, unspecified organism: Secondary | ICD-10-CM | POA: Diagnosis not present

## 2024-07-10 DIAGNOSIS — J441 Chronic obstructive pulmonary disease with (acute) exacerbation: Secondary | ICD-10-CM

## 2024-07-10 DIAGNOSIS — Z8551 Personal history of malignant neoplasm of bladder: Secondary | ICD-10-CM | POA: Diagnosis not present

## 2024-07-10 DIAGNOSIS — R6521 Severe sepsis with septic shock: Secondary | ICD-10-CM | POA: Diagnosis not present

## 2024-07-10 DIAGNOSIS — J9601 Acute respiratory failure with hypoxia: Secondary | ICD-10-CM | POA: Diagnosis not present

## 2024-07-10 DIAGNOSIS — K529 Noninfective gastroenteritis and colitis, unspecified: Secondary | ICD-10-CM | POA: Diagnosis not present

## 2024-07-10 DIAGNOSIS — R570 Cardiogenic shock: Secondary | ICD-10-CM | POA: Diagnosis not present

## 2024-07-10 DIAGNOSIS — I5023 Acute on chronic systolic (congestive) heart failure: Secondary | ICD-10-CM | POA: Diagnosis not present

## 2024-07-10 DIAGNOSIS — E8721 Acute metabolic acidosis: Secondary | ICD-10-CM

## 2024-07-10 LAB — GLUCOSE, CAPILLARY
Glucose-Capillary: 204 mg/dL — ABNORMAL HIGH (ref 70–99)
Glucose-Capillary: 226 mg/dL — ABNORMAL HIGH (ref 70–99)
Glucose-Capillary: 244 mg/dL — ABNORMAL HIGH (ref 70–99)
Glucose-Capillary: 281 mg/dL — ABNORMAL HIGH (ref 70–99)

## 2024-07-10 LAB — COMPREHENSIVE METABOLIC PANEL WITH GFR
ALT: 909 U/L — ABNORMAL HIGH (ref 0–44)
ALT: 1552 U/L — ABNORMAL HIGH (ref 0–44)
AST: 2176 U/L — ABNORMAL HIGH (ref 15–41)
AST: 4665 U/L — ABNORMAL HIGH (ref 15–41)
Albumin: 3 g/dL — ABNORMAL LOW (ref 3.5–5.0)
Albumin: 2.5 g/dL — ABNORMAL LOW (ref 3.5–5.0)
Alkaline Phosphatase: 92 U/L (ref 38–126)
Alkaline Phosphatase: 75 U/L (ref 38–126)
Anion gap: 22 — ABNORMAL HIGH (ref 5–15)
Anion gap: 21 — ABNORMAL HIGH (ref 5–15)
BUN: 17 mg/dL (ref 8–23)
BUN: 21 mg/dL (ref 8–23)
CO2: 22 mmol/L (ref 22–32)
CO2: 22 mmol/L (ref 22–32)
Calcium: 8.4 mg/dL — ABNORMAL LOW (ref 8.9–10.3)
Calcium: 9.7 mg/dL (ref 8.9–10.3)
Chloride: 97 mmol/L — ABNORMAL LOW (ref 98–111)
Chloride: 98 mmol/L (ref 98–111)
Creatinine, Ser: 1.69 mg/dL — ABNORMAL HIGH (ref 0.44–1.00)
Creatinine, Ser: 1.84 mg/dL — ABNORMAL HIGH (ref 0.44–1.00)
GFR, Estimated: 32 mL/min — ABNORMAL LOW (ref >60)
GFR, Estimated: 29 mL/min — ABNORMAL LOW (ref >60)
Glucose, Bld: 254 mg/dL — ABNORMAL HIGH (ref 70–99)
Glucose, Bld: 215 mg/dL — ABNORMAL HIGH (ref 70–99)
Potassium: 3.2 mmol/L — ABNORMAL LOW (ref 3.5–5.1)
Potassium: 4.9 mmol/L (ref 3.5–5.1)
Sodium: 141 mmol/L (ref 135–145)
Sodium: 141 mmol/L (ref 135–145)
Total Bilirubin: 0.5 mg/dL (ref 0.0–1.2)
Total Bilirubin: 0.5 mg/dL (ref 0.0–1.2)
Total Protein: 5.4 g/dL — ABNORMAL LOW (ref 6.5–8.1)
Total Protein: 4.5 g/dL — ABNORMAL LOW (ref 6.5–8.1)

## 2024-07-10 LAB — CBC
HCT: 29 % — ABNORMAL LOW (ref 36.0–46.0)
Hemoglobin: 9.6 g/dL — ABNORMAL LOW (ref 12.0–15.0)
MCH: 30.4 pg (ref 26.0–34.0)
MCHC: 33.1 g/dL (ref 30.0–36.0)
MCV: 91.8 fL (ref 80.0–100.0)
Platelets: 152 K/uL (ref 150–400)
RBC: 3.16 MIL/uL — ABNORMAL LOW (ref 3.87–5.11)
RDW: 18.2 % — ABNORMAL HIGH (ref 11.5–15.5)
WBC: 20.6 K/uL — ABNORMAL HIGH (ref 4.0–10.5)
nRBC: 1 % — ABNORMAL HIGH (ref 0.0–0.2)

## 2024-07-10 LAB — COOXEMETRY PANEL
Carboxyhemoglobin: 0.4 % — ABNORMAL LOW (ref 0.5–1.5)
Carboxyhemoglobin: <0.3 % — ABNORMAL LOW (ref 0.5–1.5)
Carboxyhemoglobin: <0.3 % — ABNORMAL LOW (ref 0.5–1.5)
Methemoglobin: <0.7 % (ref 0.0–1.5)
Methemoglobin: <0.7 % (ref 0.0–1.5)
Methemoglobin: <0.7 % (ref 0.0–1.5)
O2 Saturation: 35.4 %
O2 Saturation: 33.8 %
O2 Saturation: 97.2 %
Total hemoglobin: 9.8 g/dL — ABNORMAL LOW (ref 12.0–16.0)
Total hemoglobin: 9.6 g/dL — ABNORMAL LOW (ref 12.0–16.0)
Total hemoglobin: 9 g/dL — ABNORMAL LOW (ref 12.0–16.0)

## 2024-07-10 LAB — BLOOD GAS, ARTERIAL
Acid-base deficit: 5.4 mmol/L — ABNORMAL HIGH (ref 0.0–2.0)
Bicarbonate: 16.8 mmol/L — ABNORMAL LOW (ref 20.0–28.0)
O2 Saturation: 98 %
Patient temperature: 36.8
pCO2 arterial: 22 mmHg — ABNORMAL LOW (ref 32–48)
pH, Arterial: 7.49 — ABNORMAL HIGH (ref 7.35–7.45)
pO2, Arterial: 96 mmHg (ref 83–108)

## 2024-07-10 LAB — PRO BRAIN NATRIURETIC PEPTIDE: Pro Brain Natriuretic Peptide: >35000 pg/mL — ABNORMAL HIGH (ref <300.0)

## 2024-07-10 LAB — LACTIC ACID, PLASMA
Lactic Acid, Venous: >9 mmol/L (ref 0.5–1.9)
Lactic Acid, Venous: >9 mmol/L (ref 0.5–1.9)

## 2024-07-10 LAB — TROPONIN T, HIGH SENSITIVITY: Troponin T High Sensitivity: 688 ng/L (ref 0–19)

## 2024-07-10 MED ORDER — GLYCOPYRROLATE 0.2 MG/ML IJ SOLN
0.2000 mg | INTRAMUSCULAR | Status: DC | PRN
  Filled 2024-07-10 (×2): qty 1

## 2024-07-10 MED ORDER — POTASSIUM CHLORIDE 20 MEQ PO PACK
20.0000 meq | PACK | ORAL | Status: AC
  Administered 2024-07-10 (×2): 20 meq
  Filled 2024-07-10 (×2): qty 1

## 2024-07-10 MED ORDER — METHYLPREDNISOLONE SODIUM SUCC 125 MG IJ SOLR
60.0000 mg | INTRAMUSCULAR | Status: DC
  Administered 2024-07-10: 60 mg via INTRAVENOUS
  Filled 2024-07-10: qty 2

## 2024-07-10 MED ORDER — ASPIRIN 325 MG PO TABS
325.0000 mg | ORAL_TABLET | Freq: Once | ORAL | Status: AC
  Administered 2024-07-10: 325 mg
  Filled 2024-07-10: qty 1

## 2024-07-10 MED ORDER — DOBUTAMINE-DEXTROSE 4-5 MG/ML-% IV SOLN
2.5000 ug/kg/min | INTRAVENOUS | Status: DC
  Administered 2024-07-10: 2.5 ug/kg/min via INTRAVENOUS

## 2024-07-10 MED ORDER — FENTANYL 2500MCG IN NS 250ML (10MCG/ML) PREMIX INFUSION: 0.0000 ug/h | INTRAVENOUS | Status: DC

## 2024-07-10 MED ORDER — SODIUM BICARBONATE 8.4 % IV SOLN
INTRAVENOUS | Status: AC
  Administered 2024-07-10: 50 meq
  Filled 2024-07-10: qty 50

## 2024-07-10 MED ORDER — SODIUM BICARBONATE 8.4 % IV SOLN
INTRAVENOUS | Status: AC
  Filled 2024-07-10: qty 50

## 2024-07-10 MED ORDER — ACETAMINOPHEN 325 MG PO TABS
650.0000 mg | ORAL_TABLET | Freq: Four times a day (QID) | ORAL | Status: DC | PRN
Start: 2024-07-10 — End: 2024-07-10

## 2024-07-10 MED ORDER — DEXTROSE 5 % IV SOLN
4.0000 mg | Freq: Once | INTRAVENOUS | Status: AC
  Administered 2024-07-10: 4 mg via INTRAVENOUS
  Filled 2024-07-10: qty 16

## 2024-07-10 MED ORDER — IPRATROPIUM-ALBUTEROL 0.5-2.5 (3) MG/3ML IN SOLN: 3.0000 mL | RESPIRATORY_TRACT | Status: DC | PRN

## 2024-07-10 MED ORDER — ASPIRIN 325 MG PO TABS: 325.0000 mg | ORAL_TABLET | Freq: Once | ORAL | Status: DC

## 2024-07-10 MED ORDER — CALCIUM CHLORIDE 10 % IV SOLN
INTRAVENOUS | Status: AC
  Administered 2024-07-10: 1000 mg
  Filled 2024-07-10: qty 10

## 2024-07-10 MED ORDER — FENTANYL 2500MCG IN NS 250ML (10MCG/ML) PREMIX INFUSION
0.0000 ug/h | INTRAVENOUS | Status: DC
  Administered 2024-07-11 – 2024-07-12 (×5): 400 ug/h via INTRAVENOUS
  Filled 2024-07-10 (×5): qty 250

## 2024-07-10 MED ORDER — DOBUTAMINE-DEXTROSE 4-5 MG/ML-% IV SOLN
INTRAVENOUS | Status: AC
  Filled 2024-07-10: qty 250

## 2024-07-10 MED ORDER — ORAL CARE MOUTH RINSE
15.0000 mL | OROMUCOSAL | Status: DC
  Administered 2024-07-11 – 2024-07-22 (×35): 15 mL via OROMUCOSAL

## 2024-07-10 MED ORDER — FENTANYL CITRATE (PF) 50 MCG/ML IJ SOSY
25.0000 ug | PREFILLED_SYRINGE | INTRAMUSCULAR | Status: DC | PRN
  Administered 2024-07-10 – 2024-07-12 (×9): 100 ug via INTRAVENOUS

## 2024-07-10 MED ORDER — ORAL CARE MOUTH RINSE: 15.0000 mL | OROMUCOSAL | Status: DC | PRN

## 2024-07-10 MED ORDER — POLYVINYL ALCOHOL 1.4 % OP SOLN: 1.0000 [drp] | Freq: Four times a day (QID) | OPHTHALMIC | Status: DC | PRN

## 2024-07-10 MED ORDER — ASPIRIN 81 MG PO CHEW: 81.0000 mg | CHEWABLE_TABLET | Freq: Every day | ORAL | Status: DC

## 2024-07-10 MED ORDER — SODIUM CHLORIDE 0.9 % IV SOLN: INTRAVENOUS | Status: AC

## 2024-07-10 MED ORDER — FENTANYL 2500MCG IN NS 250ML (10MCG/ML) PREMIX INFUSION
0.0000 ug/h | INTRAVENOUS | Status: DC
  Administered 2024-07-10: 50 ug/h via INTRAVENOUS
  Administered 2024-07-10: 400 ug/h via INTRAVENOUS
  Filled 2024-07-10 (×2): qty 250

## 2024-07-10 MED ORDER — POTASSIUM CHLORIDE 10 MEQ/50ML IV SOLN
10.0000 meq | INTRAVENOUS | Status: AC
  Administered 2024-07-10 (×4): 10 meq via INTRAVENOUS
  Filled 2024-07-10 (×4): qty 50

## 2024-07-10 MED ORDER — ACETAMINOPHEN 650 MG RE SUPP: 650.0000 mg | Freq: Four times a day (QID) | RECTAL | Status: DC | PRN

## 2024-07-10 MED ORDER — GLYCOPYRROLATE 1 MG PO TABS: 1.0000 mg | ORAL_TABLET | ORAL | Status: DC | PRN

## 2024-07-10 MED ORDER — GLYCOPYRROLATE 0.2 MG/ML IJ SOLN
0.2000 mg | INTRAMUSCULAR | Status: DC | PRN
Start: 2024-07-10 — End: 2024-07-12
  Administered 2024-07-10 – 2024-07-12 (×6): 0.2 mg via INTRAVENOUS
  Filled 2024-07-10 (×5): qty 1

## 2024-07-10 NOTE — Progress Notes (Signed)
 [facility] ADULT ICU REPLACEMENT PROTOCOL   The patient does apply for the [facility] Adult ICU Electrolyte Replacment Protocol based on the criteria listed below:   1.Exclusion criteria: TCTS, ECMO, Dialysis, and Myasthenia Gravis patients 2. Is GFR >/= 30 ml/min? Yes.    Patient's GFR today is 32 3. Is SCr </= 2? Yes.   Patient's SCr is 1.69 mg/dL 4. Did SCr increase >/= 0.5 in 24 hours? No. 5.Pt's weight >40kg  Yes.   6. Abnormal electrolyte(s): K+ = 3.2  7. Electrolytes replaced per protocol 8.  Call MD STAT for K+ </= 2.5, Phos </= 1, or Mag </= 1 Physician:  Epimenio, eMD   [name] 07/10/2024 5:31 AM

## 2024-07-10 NOTE — Progress Notes (Addendum)
 NAME:  [name], MRN:  [id number], DOB:  01/17/1956, LOS: 2 ADMISSION DATE:  07/08/2024, CONSULTATION DATE:  07/10/24 REFERRING MD:  ED, CHIEF COMPLAINT:  shock   History of Present Illness:  [name] is a 68 y/o F with a PMH significant for bladder cancer w/ bone metastasis, chronic pain on methadone , HFrEF s/p AICD (recovered EF 55-60% on 05/2023), and COPD who presented to [facility] ED for N/V and diarrhea thought to be due to pancolitis found to have mixed cardiogenic and septic shock requiring invasive mechanical ventilation and hemodynamic support with vasopressors.   Pertinent  Medical History  Bladder ca mets to bone COPD NICM HFrEF s/p AICD Significant Hospital Events: Including procedures, antibiotic start and stop dates in addition to other pertinent events   Admitted to [facility] on 11/13 for N/V 11/14 early 7 AM, obtunded, hematemesis, intubated. Admitted to ICU. Profound mixed cardiogenic and septic shock. Started on broad spectrum antimicrobials, vasopressors, and steroids 11/15, remains intubated and on vasoactive substances  Interim History / Subjective:  Unable to obtain due to clinical status Afebrile, HR 90s- 100s, hemodynamics supported on vasoactive substances, SPO2 > 90% on PRVC 360/+5/30% Drips: Dex 0.8, NE 10, Vaso 0.04, Dobutamine 5 Objective    Blood pressure (!) 89/72, pulse 97, temperature 98.5 F (36.9 C), temperature source Axillary, resp. rate (!) 24, height 5' (1.524 m), weight 54.4 kg, SpO2 96%. CVP:  [13 mmHg-15 mmHg] 13 mmHg  Vent Mode: PRVC FiO2 (%):  [30 %-50 %] 30 % Set Rate:  [24 bmp] 24 bmp Vt Set:  [360 mL] 360 mL PEEP:  [5 cmH20] 5 cmH20 Plateau Pressure:  [13 cmH20-17 cmH20] 13 cmH20   Intake/Output Summary (Last 24 hours) at 07/10/2024 9081 Last data filed at 07/10/2024 0857 Gross per 24 hour  Intake 5378.95 ml  Output 650 ml  Net 4728.95 ml  Total + 5.5 L Filed Weights   07/08/24 1719  Weight: 54.4 kg    Examination: General:  intubated, sedated HENT: pupils equal and reactive to light, slightly pale Lungs: rhonchorous breath sound but improved compared to 11/14 Cardiovascular: tachycardic, systolic murmur, no friction rub, CVP around 13 cm H2O Abdomen: distended, soft, non-tender Extremities: 1+ pitting edema bilaterally Neuro: responds to simple commands by wiggling toes GU: Deferred  Resolved problem list   Assessment and Plan   #Shock: likely mixed cardiogenic and septic shock. ScVO2 is low indicative of cardiogenic shock state. Echo reviewed and systolic function is severely reduced. CO by Bernouli's is estimated at 2. - Start dobutamine titrate by 2.5 mcg to a max of 7.5 - Continue levophed  - Continue vasopressin - Steroids per plan below - Serial Coox and lactates - Appreciate cardiology input - Target MAP > 65 mmHg  #Acute on chronic systolic heart failure: - Supportive care as above - Replete lytes to high goal - Holding GDMT due to shock state - Will hold off on diuresis due to relatively normal CVP at 13  #Acute Hypoxic Respiratory Failure: intubated for airway protection due to poor mental status and COPD exacerbation - Prevent lung injury - Low tidal volume ventilation - Aim for Pplat < 30 and DP < 15 - Lowest FiO2 to keep SPO2 > 90% and PaO2 > 65 mmHg - Permissive hypercapnia - pH goal > 7.2 - Daily SAT/SBT - Steroids - ICS/LAMA/LABA - Duonebs QID  #Septic Shock: likely due to pancolitis versus PNA - Zosyn  - Vancomycin  - Azithromycin  - FU culture data  #Stress Ulcer PPx: PPI  #  Nutrition: NPO  #AKI: likely pre-renal due to shock state - Supportive care as above - Renally adjust medications - Avoid Nephrotoxins - Strict I/Os - Foley catheter  #High anion gap acidosis: likely due to lactic acidosis and uremia - Bicarb drip  #VTE ppx: Heparin  subQ TID  #Hyperglycemia: - Moderate dose SSI  #PADs: - Precedex - Fentanyl  drip to assist with comfort.  Disposition:  ICU appropriate for shock state and vent support.  Labs   CBC: Recent Labs  Lab 07/08/24 1117 07/09/24 0030 07/09/24 0846 07/09/24 1323 07/09/24 1431 07/09/24 1438 07/09/24 1640 07/10/24 0403  WBC 10.5  --  14.1*  --   --   --  15.3* 20.6*  NEUTROABS 8.3*  --   --   --   --   --   --   --   HGB 9.8*   < > 11.9*  --  8.2* 11.9* 10.3* 9.6*  HCT 29.8*   < > 36.3  --  24.0* 35.0* 33.6* 29.0*  MCV 90.6  --  92.8  --   --   --  96.8 91.8  PLT 240  --  PLATELET CLUMPS NOTED ON SMEAR, COUNT APPEARS DECREASED 155  --   --  147* 152   < > = values in this interval not displayed.    Basic Metabolic Panel: Recent Labs  Lab 07/08/24 1117 07/09/24 0030 07/09/24 0846 07/09/24 1431 07/09/24 1438 07/09/24 1440 07/09/24 1640 07/10/24 0403  NA 143 140 143 144 145  --  145 141  K 2.5* 3.5 4.1 3.5 3.5  --  3.9 3.2*  CL 107 108 109  --  106  --  102 97*  CO2 21* 16* 8*  --   --   --  14* 22  GLUCOSE 108* 124* 148*  --  237*  --  260* 254*  BUN 16 12 14   --  13  --  15 17  CREATININE 1.16* 1.00 1.25*  --  1.30*  --  1.48* 1.69*  CALCIUM  8.8* 8.9 8.8*  --   --   --  9.2 8.4*  MG 1.1* 2.3 2.3  --   --  3.0*  --   --   PHOS  --   --  5.0*  --   --   --   --   --    GFR: Estimated Creatinine Clearance: 22.2 mL/min (A) (by C-G formula based on SCr of 1.69 mg/dL (H)). Recent Labs  Lab 07/08/24 1117 07/09/24 0846 07/09/24 0929 07/09/24 1443 07/09/24 1640 07/09/24 2201 07/10/24 0016 07/10/24 0403  WBC 10.5 14.1*  --   --  15.3*  --   --  20.6*  LATICACIDVEN  --   --    < > 12.0* >9.0* >9.0* >9.0*  --    < > = values in this interval not displayed.    Liver Function Tests: Recent Labs  Lab 07/08/24 1117 07/09/24 0846 07/09/24 1640 07/10/24 0403  AST 15 183* 566* 2,176*  ALT <5 64* 228* 909*  ALKPHOS 53 86 100 92  BILITOT 0.3 0.6 0.9 0.5  PROT 6.1* 6.6 5.5* 5.4*  ALBUMIN  3.2* 3.6 2.9* 3.0*   Recent Labs  Lab 07/08/24 1117  LIPASE 15   No results for input(s): AMMONIA  in the last 168 hours.  ABG    Component Value Date/Time   PHART 7.402 07/09/2024 1431   PCO2ART 27.1 (L) 07/09/2024 1431   PO2ART 362 (H) 07/09/2024 1431  HCO3 16.9 (L) 07/09/2024 1431   TCO2 19 (L) 07/09/2024 1438   ACIDBASEDEF 7.0 (H) 07/09/2024 1431   O2SAT 33.8 07/10/2024 0838     Coagulation Profile: Recent Labs  Lab 07/08/24 1117 07/09/24 1323  INR 1.1 1.6*    Cardiac Enzymes: No results for input(s): CKTOTAL, CKMB, CKMBINDEX, TROPONINI in the last 168 hours.  HbA1C: Hgb A1c MFr Bld  Date/Time Value Ref Range Status  07/09/2024 04:40 PM 5.3 4.8 - 5.6 % Final    Comment:    (NOTE) Diagnosis of Diabetes The following HbA1c ranges recommended by the American Diabetes Association (ADA) may be used as an aid in the diagnosis of diabetes mellitus.  Hemoglobin             Suggested A1C NGSP%              Diagnosis  <5.7                   Non Diabetic  5.7-6.4                Pre-Diabetic  >6.4                   Diabetic  <7.0                   Glycemic control for                       adults with diabetes.    06/23/2023 03:16 PM 5.5 4.8 - 5.6 % Final    Comment:             Prediabetes: 5.7 - 6.4          Diabetes: >6.4          Glycemic control for adults with diabetes: <7.0     CBG: Recent Labs  Lab 07/09/24 1639 07/09/24 2206 07/10/24 0020 07/10/24 0410 07/10/24 0758  GLUCAP 197* 267* 281* 244* 226*    Review of Systems:   Not obtained.  Past Medical History:  She,  has a past medical history of Abnormality of gait due to impairment of balance (11/22/2020), Admission for long-term opiate analgesic use (10/24/2019), AKI (acute kidney injury) (11/14/2023), Anemia of chronic disease (06/07/2023), Arthralgia of both hands (04/07/2023), Arthritis of right hip (05/29/2016), Arthritis of right knee (11/27/2016), At risk for side effect of medication (12/18/2023), Bilateral lower extremity edema (06/07/2023), BMI 26.0-26.9,adult  (03/29/2020), Bone lesion (11/18/2023), Cardiomyopathy (HCC), Chronic active hepatitis (HCC) (01/04/2023), Chronic back pain, Chronic hypoxemic respiratory failure (HCC) (10/24/2019), Chronic narcotic use (03/23/2015), Chronic pain of both knees (12/21/2018), Chronic systolic congestive heart failure, NYHA class 2 (HCC) (06/19/2017), CKD (chronic kidney disease), stage III (HCC) (12/01/2023), 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), Community acquired pneumonia of left lower lobe of lung (09/25/2023), COPD (chronic obstructive pulmonary disease) (HCC), COPD exacerbation (HCC) (01/04/2023), Coronary artery disease involving native coronary artery of native heart without angina pectoris (05/31/2015), Decreased appetite (12/18/2023), Depression, major, recurrent, moderate (HCC) (10/24/2019), Diarrhea (05/17/2020), Dietary folate deficiency anemia (11/21/2023), Dilated cardiomyopathy (HCC) (06/19/2017), Diverticulosis, 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), 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 (06/19/2017), Dyslipidemia (05/31/2015), Elevated serum glucose (06/23/2023), Fatigue (06/23/2023), Folate deficiency (01/08/2024), GERD (gastroesophageal reflux disease), History of total hip replacement (09/25/2016), 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), Hypotension (05/17/2020), ICD (implantable cardioverter-defibrillator) in place (06/19/2017), Ileus following gastrointestinal surgery (HCC) (03/26/2015), Iron deficiency (11/21/2023), Low vitamin B12 level (11/21/2023), Major depressive disorder, single episode, moderate (HCC) (10/24/2019), Malnutrition of moderate degree (10/20/2020), Mesenteric ischemia (10/16/2020),  Migraine without aura and without status migrainosus, not intractable (01/04/2023), Mixed hyperlipidemia (05/31/2015), Mixed incontinence (10/20/2020), Moderate protein-calorie malnutrition (11/03/2023), Nausea with vomiting, Nausea without vomiting (01/29/2024), Nephrostomy present (HCC) (12/01/2023),  Neuropathy (01/29/2024), Normocytic anemia (06/23/2023), Osteoporosis (10/24/2019), Other spondylosis with radiculopathy, lumbar region (10/24/2019), Paraplegia (HCC) (11/14/2023), Presence of left artificial hip joint (10/24/2019), Primary insomnia (01/04/2023), PTSD (post-traumatic stress disorder), Renal lesion (11/05/2023), Rheumatoid arthritis involving multiple sites (HCC) (04/07/2023), Senile osteoporosis (10/24/2019), Serosanguineous chronic otitis media of right ear (08/02/2021), Therapeutic opioid induced constipation (01/04/2023), Thyroid  disease, Urge incontinence of urine (11/03/2023), Urothelial carcinoma of bladder (HCC) (11/18/2023), Vertebral fracture, pathological (12/01/2023), and Vitamin D  deficiency (09/25/2023).   Surgical History:   Past Surgical History:  Procedure Laterality Date   APPENDECTOMY     CARDIAC CATHETERIZATION     CARPAL TUNNEL RELEASE     CESAREAN SECTION     CHF s/p AICD   12/2010   CHOLECYSTECTOMY     COLONOSCOPY  10/16/2015   Mild colonic diverticulosis, predominantly in the left colon. Small internal hemrrhoids. Otherwise normal colonoscopy   COLONOSCOPY WITH PROPOFOL  N/A 09/27/2021   Procedure: COLONOSCOPY WITH PROPOFOL ;  Surgeon: Shila Gustav GAILS, MD;  Location: [facility];  Service: Endoscopy;  Laterality: N/A;   CORONARY ANGIOPLASTY     ESOPHAGOGASTRODUODENOSCOPY  02/04/2014   Mild gastritis. Otherwise normal EGD   HAND SURGERY Bilateral    ICD GENERATOR CHANGEOUT N/A 05/21/2019   Procedure: ICD GENERATOR CHANGEOUT;  Surgeon: Inocencio Soyla Lunger, MD;  Location: [facility] INVASIVE CV LAB;  Service: Cardiovascular;  Laterality: N/A;   ICD IMPLANT     [employer]tr[employer]   intestinal blockage 2011     IR IMAGING GUIDED PORT INSERTION  12/24/2023   IR NEPHRO TUBE REMOV/FL  06/02/2024   IR NEPHROSTOMY PLACEMENT RIGHT  11/16/2023   IR URETERAL STENT PLACEMENT EXISTING ACCESS RIGHT  12/24/2023   LAPAROSCOPIC ASSISTED VENTRAL HERNIA REPAIR N/A 03/23/2015    Procedure: LAPAROSCOPIC VENTRAL WALL HERNIA REPAIR;  Surgeon: Elspeth Schultze, MD;  Location: [facility];  Service: General;  Laterality: N/A;  With MESH   LAPAROSCOPIC LYSIS OF ADHESIONS N/A 03/23/2015   Procedure: LAPAROSCOPIC LYSIS OF ADHESIONS;  Surgeon: Elspeth Schultze, MD;  Location: [facility];  Service: General;  Laterality: N/A;   LSCS      x2   NASAL SEPTUM SURGERY     NECK SURGERY     fused   TONSILLECTOMY     TRANSURETHRAL RESECTION OF BLADDER TUMOR N/A 11/16/2023   Procedure: TURBT (TRANSURETHRAL RESECTION OF BLADDER TUMOR);  Surgeon: [name] Lonni Righter, MD;  Location: [facility];  Service: Urology;  Laterality: N/A;   TYMPANOSTOMY TUBE PLACEMENT Right 2024   ULNAR NERVE TRANSPOSITION  01/23/2012   Procedure: ULNAR NERVE DECOMPRESSION/TRANSPOSITION;  Surgeon: Reyes JONETTA Budge, MD;  Location: [facility];  Service: Neurosurgery;  Laterality: Left;  LEFT ulnar nerve decompression     Social History:   reports that she quit smoking about 4 years ago. Her smoking use included cigarettes. She started smoking about 34 years ago. She has a 15 pack-year smoking history. She has never used smokeless tobacco. She reports current alcohol use of about 2.0 standard drinks of alcohol per week. She reports that she does not use drugs.   Family History:  Her family history includes Alcohol abuse in her father; Cancer in an other family member; Heart attack in her father, mother, and another family member; Heart failure in an other family member; Hypertension in her father and mother. There is no  history of Anesthesia problems, Hypotension, Malignant hyperthermia, Pseudochol deficiency, or Breast cancer.   Allergies Allergies  Allergen Reactions   Lyrica  [Pregabalin ] Other (See Comments)    Jerking, twitching, memory changes      Home Medications  Prior to Admission medications   Medication Sig Start Date End Date Taking? Authorizing Provider  ARIPiprazole  (ABILIFY ) 5 MG tablet TAKE 1 TABLET BY  MOUTH DAILY 10/07/23   Cox, Abigail, MD  aspirin  81 MG tablet Take 81 mg by mouth daily.    [provider]  BREO ELLIPTA  100-25 MCG/ACT AEPB INHALE ONE (1) PUFF BY MOUTH DAILY 05/25/24   Teressa Harrie HERO, FNP  Carboxymethylcell-Glycerin  PF 0.5-0.9 % SOLN Place 2 drops into both eyes 4 (four) times daily. Patient not taking: Reported on 07/07/2024 12/01/23   Tina Pauletta BROCKS, MD  carvedilol  (COREG ) 3.125 MG tablet TAKE ONE (1) TABLET BY MOUTH TWICE DAILY WITH MEALS Patient taking differently: Take 3.125 mg by mouth 2 (two) times daily with a meal. 11/26/23   Teressa Harrie HERO, FNP  denosumab  (PROLIA ) 60 MG/ML SOSY injection Inject 60 mg into the skin every 6 (six) months. 02/14/20   Abran Jerilynn Loving, MD  dextromethorphan  15 MG/5ML syrup Take 10 mLs (30 mg total) by mouth 4 (four) times daily as needed for cough. 10/22/23   Teressa Harrie HERO, FNP  diclofenac  Sodium (VOLTAREN ) 1 % GEL Apply 2 g topically 4 (four) times daily. 05/01/21   Abran Jerilynn Loving, MD  diphenoxylate -atropine  (LOMOTIL ) 2.5-0.025 MG tablet Take 1 tablet by mouth 4 (four) times daily as needed for diarrhea or loose stools. 07/04/24   Pickenpack-Cousar, Athena N, NP  dronabinol  (MARINOL ) 5 MG capsule Take 1 capsule (5 mg total) by mouth 2 (two) times daily before lunch and supper. 03/15/24   Pickenpack-Cousar, Athena N, NP  ENTRESTO  24-26 MG TAKE ONE (1) TABLET BY MOUTH TWICE DAILY 11/26/23   Teressa Harrie HERO, FNP  EPINEPHRINE  0.3 mg/0.3 mL IJ SOAJ injection Inject 0.3 mLs (0.3 mg total) into the muscle as needed for anaphylaxis. Patient not taking: Reported on 07/07/2024 04/27/20   Abran Jerilynn Loving, MD  esomeprazole  (NEXIUM ) 20 MG capsule TAKE ONE (1) CAPSULE BY MOUTH ONCE DAILY 02/24/24   Cox, Kirsten, MD  FARXIGA 5 MG TABS tablet Take 5 mg by mouth daily. 10/10/23   [provider]  ferrous sulfate  325 (65 FE) MG tablet Take 1 tablet (325 mg total) by mouth at bedtime. 11/24/23   Gherghe, Costin M, MD  fluticasone  (FLONASE )  50 MCG/ACT nasal spray USE 2 SPRAYS IN EACH NOSTRILS DAILY AS NEEDED Patient not taking: Reported on 07/07/2024 07/18/23   Teressa Harrie HERO, FNP  folic acid  (FOLVITE ) 1 MG tablet Take 1 tablet (1 mg total) by mouth daily. Patient not taking: Reported on 07/07/2024 01/08/24   Tina Pauletta BROCKS, MD  furosemide  (LASIX ) 20 MG tablet Take 3 tablets (60 mg total) by mouth daily. 07/07/24   Krasowski, Robert J, MD  ipratropium-albuterol  (DUONEB) 0.5-2.5 (3) MG/3ML SOLN USE 1 VIAL VIA NEBULIZER EVERY 6 HOURS AS NEEDED FOR SHORTNESS OF BREATH Patient taking differently: Take 3 mLs by nebulization every 6 (six) hours as needed (SOB). 10/16/23   Teressa Harrie HERO, FNP  levocetirizine (XYZAL) 5 MG tablet Take 5 mg by mouth daily as needed for allergies. 12/13/19   [provider]  lidocaine  (LIDODERM ) 5 % Place 1 patch onto the skin daily. Remove & Discard patch within 12 hours or as directed by MD Patient not taking:  Reported on 07/07/2024 11/24/23   Trixie Nilda HERO, MD  lidocaine -prilocaine  (EMLA ) cream Apply to affected area once Patient not taking: Reported on 07/07/2024 12/01/23   Tina Pauletta BROCKS, MD  lubiprostone  (AMITIZA ) 24 MCG capsule TAKE 1 CAPSULE(24 MCG) BY MOUTH TWICE DAILY WITH A MEAL Patient taking differently: Take 24 mcg by mouth 2 (two) times daily with a meal. 09/15/23   Cox, Kirsten, MD  methadone  (DOLOPHINE ) 10 MG tablet Take 1 tablet (10 mg total) by mouth every 8 (eight) hours. Patient taking differently: Take 10 mg by mouth every 8 (eight) hours as needed (for pain). 06/14/24   Pickenpack-Cousar, Athena N, NP  methadone  (DOLOPHINE ) 5 MG tablet Take 1 tablet (5 mg total) by mouth every 8 (eight) hours. (Take in addition to 10mg  (Total 15mg )). 07/01/24   Pickenpack-Cousar, Athena N, NP  methylPREDNISolone  (MEDROL  DOSEPAK) 4 MG TBPK tablet If significant rash, will start at 6 tabs on day 1, then 5 tabs on day 2, then 4 tabs on day 3, then 3 tabs on day 4, then 2 tabs on day 5 and then 1 tab on  day 6. Take with food. 12/25/23   Tina Pauletta BROCKS, MD  metoCLOPramide  (REGLAN ) 10 MG tablet Take 1 tablet (10 mg total) by mouth every 8 (eight) hours as needed for nausea. 01/07/24   Pickenpack-Cousar, Athena N, NP  Multiple Vitamin (MULTIVITAMIN WITH MINERALS) TABS tablet Take 1 tablet by mouth daily. 10/30/20   Sebastian Toribio GAILS, MD  naloxone  (NARCAN ) nasal spray 4 mg/0.1 mL Place 1 spray into the nose once. 01/12/21   [provider]  nitroGLYCERIN  (NITROSTAT ) 0.4 MG SL tablet Place 1 tablet (0.4 mg total) under the tongue every 5 (five) minutes as needed for chest pain. 11/27/21   Krasowski, Robert J, MD  ondansetron  (ZOFRAN ) 4 MG tablet Take 1 tablet (4 mg total) by mouth every 8 (eight) hours as needed for nausea or vomiting. 05/17/20   Abran Jerilynn Loving, MD  ondansetron  (ZOFRAN ) 8 MG tablet Take 1 tablet (8 mg total) by mouth every 8 (eight) hours as needed for nausea or vomiting. 12/01/23   Tina Pauletta BROCKS, MD  Oxycodone  HCl 20 MG TABS Take 1 tablet (20 mg total) by mouth every 4 (four) hours as needed. Patient taking differently: Take 20 mg by mouth every 4 (four) hours as needed (for pain). 07/04/24   Pickenpack-Cousar, Athena N, NP  potassium chloride  (KLOR-CON  M) 10 MEQ tablet Take 2 tablets by mouth twice daily for 3 days, then 1 tablet once daily 06/29/24   Tina Pauletta BROCKS, MD  prochlorperazine  (COMPAZINE ) 10 MG tablet TAKE 1 TABLET(10 MG) BY MOUTH EVERY 6 HOURS AS NEEDED FOR NAUSEA OR VOMITING 04/27/24   Tina Pauletta BROCKS, MD  ranolazine  (RANEXA ) 500 MG 12 hr tablet TAKE 1 TABLET BY MOUTH TWICE DAILY 10/28/23   Krasowski, Robert J, MD  rosuvastatin  (CRESTOR ) 20 MG tablet TAKE 1 TABLET BY MOUTH ONCE DAILY 02/24/24   Cox, Kirsten, MD  sertraline  (ZOLOFT ) 100 MG tablet TAKE 1/2 TABLET(50 MG) BY MOUTH DAILY Patient taking differently: Take 50 mg by mouth daily. 08/24/23   CoxAbigail, MD  sulfamethoxazole -trimethoprim  (BACTRIM  DS) 800-160 MG tablet Take 1 tablet by mouth 2 (two) times daily.  02/05/24   Tina Pauletta BROCKS, MD  tiZANidine  (ZANAFLEX ) 4 MG tablet Take 1 tablet (4 mg total) by mouth at bedtime. Patient not taking: Reported on 07/07/2024 06/07/24   Pickenpack-Cousar, Athena N, NP  topiramate  (TOPAMAX ) 50 MG tablet TAKE TWO (  2) TABLETS BY MOUTH EVERY DAY AT BEDTIME Patient not taking: Reported on 07/07/2024 11/26/23   Teressa Harrie HERO, FNP  Ubrogepant  (UBRELVY ) 100 MG TABS Take 1 tablet (100 mg total) by mouth as needed (May repeat after 2 hours.  Maximum 2 tablets in 24 hours). TAKE 1 TABLET BY MOUTH BY MOUTH AS NEEDED( MAY REPEAT 1 TABLET AFTER 2 HOURS IF NEEDED, MAXIMUM 2 TABLETS IN 24 HOURS) Strength: 100 mg Patient not taking: Reported on 07/07/2024 04/07/23   Cox, Abigail, MD  Vitamin D , Ergocalciferol , (DRISDOL ) 1.25 MG (50000 UNIT) CAPS capsule TAKE 1 CAPSULE BY MOUTH EVERY 7 DAYS FOR 12 DOSES Patient taking differently: Take 50,000 Units by mouth every 7 (seven) days. Patient usually takes this medication on mondays 06/30/23   Cox, Abigail, MD     Due to a high probability of clinically significant, life threatening deterioration, the patient required my highest level of preparedness to intervene emergently and I personally spent this critical care time directly and personally managing the patient. This critical care time included obtaining a history; examining the patient; pulse oximetry; ordering and review of studies; arranging urgent treatment with development of a management plan; evaluation of patient's response to treatment; frequent reassessment; and, discussions with other providers.  This critical care time was performed to assess and manage the high probability of imminent, life-threatening deterioration that could result in multi-organ failure. It was exclusive of separately billable procedures and treating other patients and teaching time. Critical Care Time: 60 minutes.  Paula Southerly, MD [facility]

## 2024-07-10 NOTE — Assessment & Plan Note (Addendum)
 Trial of steroid 1 mg/kg per day

## 2024-07-10 NOTE — Assessment & Plan Note (Signed)
 In remission She appears to have complete response on PET. Study showed likely very good outcome in those patients achieve CR from bladder cancer standpoint. This can be followed as outpatient

## 2024-07-10 NOTE — Plan of Care (Signed)
  Problem: Respiratory: Goal: Ability to maintain a clear airway and adequate ventilation will improve Outcome: Progressing   Problem: Clinical Measurements: Goal: Respiratory complications will improve Outcome: Progressing Goal: Cardiovascular complication will be avoided Outcome: Progressing   Problem: Elimination: Goal: Will not experience complications related to urinary retention Outcome: Progressing   Problem: Safety: Goal: Ability to remain free from injury will improve Outcome: Progressing   Problem: Clinical Measurements: Goal: Diagnostic test results will improve Outcome: Not Progressing

## 2024-07-10 NOTE — Plan of Care (Signed)

## 2024-07-10 NOTE — Progress Notes (Signed)
 Brief Cardiology Note  I spoke with the on call intensivist regarding this patient. Per my chart review, it looks like she has a mixed septic and cardiogenic shock picture. Her clinical situation is complex as she has a persistent lactate >9.0 and did not tolerate epinephrine . We discussed trialing some low dose dobutamine, however, she has a history of NSVT and frequent PVCs. Additionally, she is tachycardic to >110 so I am not confident that she will tolerate this. Her FiO2 is at 30% which makes me hesitant to suggest diuresis. Her troponin has gone from 400s-> 680, but this does not seem like ACS as she reportedly had no chest pain and had a clean coronary angiogram from 2016. I would defer heparin  as she has a history of hemoptysis, and her elevated troponin is better explained by her cardiomyopathy and septic picture. I asked the team to get a central pressure off her central line to help guide decision process (albeit not always the most accurate). Ultimately, she would benefit from invasive hemodynamics to better understand her filling pressures and cardiac output. Palliative care has been mentioned in previous notes so it is not clear to me how if further invasive testing would be within her goals.

## 2024-07-10 NOTE — Progress Notes (Signed)
 Progress Note  Patient Name: [name] Date of Encounter: 07/10/2024  Primary Cardiologist:   Lamar Fitch, MD   Subjective   Intubated and sedated.   Inpatient Medications    Scheduled Meds:  arformoterol  15 mcg Nebulization BID   [START ON 07/11/2024] aspirin   81 mg Per Tube Daily   budesonide (PULMICORT) nebulizer solution  0.5 mg Nebulization BID   Chlorhexidine  Gluconate Cloth  6 each Topical Daily   docusate  100 mg Per Tube BID   fentaNYL  (SUBLIMAZE ) injection  25 mcg Intravenous Once   heparin  injection (subcutaneous)  5,000 Units Subcutaneous Q8H   insulin aspart  0-5 Units Subcutaneous QHS   insulin aspart  0-9 Units Subcutaneous Q4H   ipratropium-albuterol   3 mL Nebulization Q6H   methadone   5 mg Per Tube Q8H   methylPREDNISolone  (SOLU-MEDROL ) injection  60 mg Intravenous Q24H   mouth rinse  15 mL Mouth Rinse Q2H   oxidized cellulose  1 each Topical Once   pantoprazole  (PROTONIX ) IV  40 mg Intravenous QHS   polyethylene glycol  17 g Per Tube Daily   potassium chloride   20 mEq Per Tube Q4H   revefenacin  175 mcg Nebulization Daily   sertraline   50 mg Per Tube Daily   Continuous Infusions:  sodium chloride      azithromycin  Stopped (07/09/24 1352)   dexmedetomidine (PRECEDEX) IV infusion 0.7 mcg/kg/hr (07/10/24 0755)   DOBUTamine     DOBUTamine     epinephrine  Stopped (07/09/24 1539)   norepinephrine  (LEVOPHED ) Adult infusion 10 mcg/min (07/10/24 0755)   piperacillin -tazobactam (ZOSYN )  IV 12.5 mL/hr at 07/10/24 0755   potassium chloride  50 mL/hr at 07/10/24 0755   sodium bicarbonate  150 mEq in dextrose  5 % 1,150 mL infusion 100 mL/hr at 07/10/24 0755   vancomycin  Stopped (07/09/24 1812)   vasopressin 0.04 Units/min (07/10/24 0755)   PRN Meds: Place/Maintain arterial line **AND** sodium chloride , acetaminophen  **OR** acetaminophen , DOBUTamine, fentaNYL  (SUBLIMAZE ) injection, fentaNYL  (SUBLIMAZE ) injection, ondansetron  (ZOFRAN ) IV, mouth rinse,  prochlorperazine    Vital Signs    Vitals:   07/10/24 0809 07/10/24 0810 07/10/24 0811 07/10/24 0812  BP:      Pulse:      Resp:      Temp:      TempSrc:      SpO2: 99% 99% 99% 99%  Weight:      Height:        Intake/Output Summary (Last 24 hours) at 07/10/2024 0813 Last data filed at 07/10/2024 0755 Gross per 24 hour  Intake 5237.76 ml  Output 650 ml  Net 4587.76 ml   Filed Weights   07/08/24 1719  Weight: 54.4 kg    Telemetry    Sinus with tachycardia, supraventricular and ventricular ectopy- Personally Reviewed  ECG    NA- Personally Reviewed  Physical Exam   GEN: Acutely ill-appearing Neck: Unable to assess JVD Cardiac: RRR, no murmurs, rubs, or gallops.  Respiratory:   Decreased breath sounds GI: Decreased bowel sounds MS: Diffuse edema; No deformity.  Labs    Chemistry Recent Labs  Lab 07/09/24 0846 07/09/24 1431 07/09/24 1438 07/09/24 1640 07/10/24 0403  NA 143   < > 145 145 141  K 4.1   < > 3.5 3.9 3.2*  CL 109  --  106 102 97*  CO2 8*  --   --  14* 22  GLUCOSE 148*  --  237* 260* 254*  BUN 14  --  13 15 17   CREATININE 1.25*  --  1.30* 1.48* 1.69*  CALCIUM  8.8*  --   --  9.2 8.4*  PROT 6.6  --   --  5.5* 5.4*  ALBUMIN  3.6  --   --  2.9* 3.0*  AST 183*  --   --  566* 2,176*  ALT 64*  --   --  228* 909*  ALKPHOS 86  --   --  100 92  BILITOT 0.6  --   --  0.9 0.5  GFRNONAA 46*  --   --  38* 32*  ANIONGAP 26*  --   --  29* 22*   < > = values in this interval not displayed.     Hematology Recent Labs  Lab 07/09/24 0846 07/09/24 1323 07/09/24 1431 07/09/24 1438 07/09/24 1640 07/10/24 0403  WBC 14.1*  --   --   --  15.3* 20.6*  RBC 3.91  --   --   --  3.47* 3.16*  HGB 11.9*  --    < > 11.9* 10.3* 9.6*  HCT 36.3  --    < > 35.0* 33.6* 29.0*  MCV 92.8  --   --   --  96.8 91.8  MCH 30.4  --   --   --  29.7 30.4  MCHC 32.8  --   --   --  30.7 33.1  RDW 18.0*  --   --   --  18.3* 18.2*  PLT PLATELET CLUMPS NOTED ON SMEAR, COUNT  APPEARS DECREASED 155  --   --  147* 152   < > = values in this interval not displayed.    Cardiac EnzymesNo results for input(s): TROPONINI in the last 168 hours. No results for input(s): TROPIPOC in the last 168 hours.   BNP Recent Labs  Lab 07/09/24 1323 07/10/24 0403  PROBNP >35,000.0* >35,000.0*     DDimer  Recent Labs  Lab 07/09/24 1323  DDIMER 2.82*     Radiology    CT HEAD WO CONTRAST ( ) Result Date: 07/09/2024 EXAM: CT HEAD WITHOUT CONTRAST 07/09/2024 03:23:12 PM TECHNIQUE: CT of the head was performed without the administration of intravenous contrast. Automated exposure control, iterative reconstruction, and/or weight based adjustment of the mA/kV was utilized to reduce the radiation dose to as low as reasonably achievable. COMPARISON: CT head 11/02/2018. CLINICAL HISTORY: AMS. FINDINGS: BRAIN AND VENTRICLES: No acute hemorrhage. No evidence of acute infarct. Redemonstrated remote lacunar infarct in the right basal ganglia. Mild chronic microvascular ischemic changes. Similar mild age related volume loss. No hydrocephalus. No extra-axial collection. No mass effect or midline shift. ORBITS: No acute abnormality. SINUSES: No acute abnormality. SOFT TISSUES AND SKULL: No acute soft tissue abnormality. No skull fracture. IMPRESSION: 1. No acute intracranial abnormality. 2. Remote lacunar infarct in the right basal ganglia, unchanged. 3. Mild chronic microvascular ischemic changes and mild volume loss. Electronically signed by: Donnice Mania MD 07/09/2024 04:04 PM EST RP Workstation: [id number]   ECHOCARDIOGRAM COMPLETE Result Date: 07/09/2024    ECHOCARDIOGRAM REPORT   Patient Name:   MUSKAN BOLLA [facility] Date of Exam: 07/09/2024 Medical Rec #:  [id number]      Height:       60.0 in Accession #:    [id number]     Weight:       120.0 lb Date of Birth:  11-14-1955      BSA:          1.502 m Patient Age:    68 years       BP:  93/74 mmHg Patient Gender: F              HR:            86 bpm. Exam Location:  Inpatient Procedure: 2D Echo, Cardiac Doppler, Color Doppler and Intracardiac            Opacification Agent (Both Spectral and Color Flow Doppler were            utilized during procedure). Indications:    CHF-Acute Systolic I50.21  History:        Patient has prior history of Echocardiogram examinations, most                 recent 06/25/2023. Cardiomyopathy; CAD.  Sonographer:    Jayson Gaskins Referring Phys: [id number] GRACE E BOWSER IMPRESSIONS  1. Left ventricular ejection fraction, by estimation, is <20%. The left ventricle has severely decreased function. The left ventricle demonstrates regional wall motion abnormalities (see scoring diagram/findings for description). The left ventricular internal cavity size was moderately dilated. Left ventricular diastolic function could not be evaluated.  2. Significant left ventricular apical contrast swirling suggestive of sluggish flow without any obvious apical thrombus.  3. Right ventricular systolic function is mildly reduced. The right ventricular size is mildly enlarged. Moderately increased right ventricular wall thickness.  4. Left atrial size was mildly dilated.  5. The mitral valve is normal in structure. Mild mitral valve regurgitation. No evidence of mitral stenosis.  6. Tricuspid valve regurgitation is moderate.  7. The aortic valve is normal in structure. Aortic valve regurgitation is not visualized. No aortic stenosis is present.  8. The inferior vena cava is dilated in size with <50% respiratory variability, suggesting right atrial pressure of 15 mmHg. Comparison(s): A prior study was performed on 06/25/2023. The ejection fraction was 55-60% with grade I diastolic dysfunction and no regional wall motion abnormalities however on personal read the ejection fraction was likely mildly reduced with septal hypokinesis. Conclusion(s)/Recommendation(s): Critical findings reported to Dr. Paula Holster. FINDINGS  Left Ventricle:  Significant left ventricular apical contrast swirling suggestive of sluggish flow without any obvious apical thrombus. Left ventricular ejection fraction, by estimation, is <20%. The left ventricle has severely decreased function. The left ventricle demonstrates regional wall motion abnormalities. Definity contrast agent was given IV to delineate the left ventricular endocardial borders. The left ventricular internal cavity size was moderately dilated. There is no left ventricular hypertrophy. Left ventricular diastolic function could not be evaluated.  LV Wall Scoring: The entire anterior wall, antero-lateral wall, mid and distal lateral wall, entire septum, entire apex, and mid and distal inferior wall are akinetic. The basal inferolateral segment and basal inferior segment are hypokinetic. Right Ventricle: The right ventricular size is mildly enlarged. Moderately increased right ventricular wall thickness. Right ventricular systolic function is mildly reduced. Left Atrium: Left atrial size was mildly dilated. Right Atrium: Right atrial size was normal in size. Pericardium: There is no evidence of pericardial effusion. Mitral Valve: The mitral valve is normal in structure. There is mild thickening of the mitral valve leaflet(s). Mild mitral valve regurgitation. No evidence of mitral valve stenosis. Tricuspid Valve: The tricuspid valve is not well visualized. Tricuspid valve regurgitation is moderate . No evidence of tricuspid stenosis. Aortic Valve: The aortic valve is normal in structure. Aortic valve regurgitation is not visualized. No aortic stenosis is present. Aortic valve mean gradient measures 1.0 mmHg. Aortic valve peak gradient measures 2.5 mmHg. Aortic valve area, by VTI measures 1.98 cm. Pulmonic Valve: The pulmonic  valve was normal in structure. Pulmonic valve regurgitation is not visualized. No evidence of pulmonic stenosis. Aorta: The aortic root is normal in size and structure. Venous: The inferior  vena cava is dilated in size with less than 50% respiratory variability, suggesting right atrial pressure of 15 mmHg. IAS/Shunts: No atrial level shunt detected by color flow Doppler. Additional Comments: A device lead is visualized in the right atrium and right ventricle.  LEFT VENTRICLE PLAX 2D LVIDd:         6.40 cm LVIDs:         5.70 cm LV PW:         0.80 cm LV IVS:        0.80 cm LVOT diam:     1.95 cm LV SV:         20 LV SV Index:   13 LVOT Area:     2.99 cm  RIGHT VENTRICLE            IVC RV S prime:     8.49 cm/s  IVC diam: 2.00 cm TAPSE (M-mode): 2.4 cm LEFT ATRIUM             Index        RIGHT ATRIUM           Index LA Vol (A2C):   27.6 ml 18.37 ml/m  RA Area:     11.90 cm LA Vol (A4C):   53.3 ml 35.48 ml/m  RA Volume:   24.40 ml  16.24 ml/m LA Biplane Vol: 41.5 ml 27.62 ml/m  AORTIC VALVE AV Area (Vmax):    2.00 cm AV Area (Vmean):   1.84 cm AV Area (VTI):     1.98 cm AV Vmax:           78.90 cm/s AV Vmean:          50.200 cm/s AV VTI:            0.100 m AV Peak Grad:      2.5 mmHg AV Mean Grad:      1.0 mmHg LVOT Vmax:         52.90 cm/s LVOT Vmean:        31.000 cm/s LVOT VTI:          0.066 m LVOT/AV VTI ratio: 0.66  AORTA Ao Root diam: 3.20 cm MITRAL VALVE               TRICUSPID VALVE MV Area (PHT): 3.86 cm    TR Peak grad:   30.0 mmHg MV E velocity: 48.80 cm/s  TR Vmax:        274.00 cm/s                             SHUNTS                            Systemic VTI:  0.07 m                            Systemic Diam: 1.95 cm Emeline Calender Electronically signed by Emeline Calender Signature Date/Time: 07/09/2024/1:23:07 PM    Final    DG Chest Port 1 View Result Date: 07/09/2024 CLINICAL DATA:  Ventilator dependence.  Endotracheal tube placement. EXAM: PORTABLE CHEST 1 VIEW COMPARISON:  07/09/2024 FINDINGS: Endotracheal tube tip is positioned at the carina. This could  be withdrawn about 2 cm for tip placement in the distal trachea. The NG tube passes into the stomach although the distal tip  position is not included on the film. The cardio pericardial silhouette is enlarged. Similar asymmetric elevation right hemidiaphragm. Bibasilar atelectasis or infiltrate with probable small layering bilateral pleural effusions. Right Port-A-Cath again noted. Left-sided permanent pacemaker evident. Telemetry leads overlie the chest. Gaseous distention of the colon is identified in the right upper quadrant with gaseous distention of the stomach seen in the left subdiaphragmatic region. IMPRESSION: 1. Endotracheal tube tip is positioned at the carina. This could be withdrawn about 2 cm for tip placement in the distal trachea. 2. Bibasilar atelectasis or infiltrate with probable small layering bilateral pleural effusions. Critical Value/emergent results were called by telephone at the time of interpretation on 07/09/2024 at 7:26 am to ER Provider , who verbally acknowledged these results. Electronically Signed   By: Camellia Candle M.D.   On: 07/09/2024 07:27   DG Chest Port 1 View Result Date: 07/09/2024 EXAM: 1 VIEW(S) XRAY OF THE CHEST 07/09/2024 06:30:00 AM COMPARISON: 11/06/2023 CLINICAL HISTORY: sob FINDINGS: LINES, TUBES AND DEVICES: Right chest port with tip at cavoatrial junction. Left chest AICD noted. LUNGS AND PLEURA: Low lung volumes. Bilateral interstitial opacities. Elevated right hemidiaphragm with right basilar atelectasis. No pleural effusion. No pneumothorax. HEART AND MEDIASTINUM: Aortic atherosclerosis. No acute abnormality of the cardiac and mediastinal silhouettes. BONES AND SOFT TISSUES: Cervical fusion hardware. Gaseous bowel distention. IMPRESSION: 1. Low lung volumes with bilateral interstitial opacities and right basilar atelectasis. 2. Elevated right hemidiaphragm. Electronically signed by: Evalene Coho MD 07/09/2024 06:38 AM EST RP Workstation: [id number]   CT ABDOMEN PELVIS W CONTRAST Result Date: 07/08/2024 CLINICAL DATA:  Abdominal pain, nausea and vomiting. Bladder cancer.  EXAM: CT ABDOMEN AND PELVIS WITH CONTRAST TECHNIQUE: Multidetector CT imaging of the abdomen and pelvis was performed using the standard protocol following bolus administration of intravenous contrast. RADIATION DOSE REDUCTION: This exam was performed according to the departmental dose-optimization program which includes automated exposure control, adjustment of the mA and/or kV according to patient size and/or use of iterative reconstruction technique. CONTRAST:  80mL OMNIPAQUE  IOHEXOL  300 MG/ML  SOLN COMPARISON:  Pelvic CT dated 11/17/2023. FINDINGS: Evaluation of this exam is limited due to respiratory motion and streak artifact caused by patient's arms and pacemaker. Lower chest: There is eventration of the right hemidiaphragm with right lung base atelectasis. Pneumonia is not excluded. Cardiac pacemaker noted. No intra-abdominal free air or free fluid. Hepatobiliary: The liver is unremarkable. There is mild dilatation, post cholecystectomy. No retained calcified stone noted in the central CBD. Pancreas: Unremarkable. No pancreatic ductal dilatation or surrounding inflammatory changes. Spleen: Normal in size without focal abnormality. Adrenals/Urinary Tract: The adrenal glands unremarkable. Small bilateral renal cysts and subcentimeter hypodense lesions which are too small to characterize. There is a mild right hydronephrosis. Right pigtail ureteral stent with proximal tip in the proximal right ureter distal end in the urinary bladder. No hydronephrosis on the left. There is delayed excretion of contrast by the right kidney. The urinary bladder is decompressed around a Foley catheter. Stomach/Bowel: There is diffuse thickened appearance of the colon consistent with pancolitis. There is no bowel obstruction. Appendectomy. Vascular/Lymphatic: Mild aortoiliac atherosclerotic disease. The IVC is unremarkable. No portal venous gas. No adenopathy. Reproductive: The uterus is poorly visualized and not evaluated. Other:  None Musculoskeletal: Osteopenia with degenerative changes of spine. Total right hip arthroplasty with associated streak artifact limiting evaluation the pelvic structures. No  acute osseous pathology. IMPRESSION: 1. Pancolitis. No bowel obstruction. 2. Mild right hydronephrosis. Right pigtail ureteral stent with proximal tip in the proximal right ureter. 3.  Aortic Atherosclerosis (ICD10-I70.0). Electronically Signed   By: Vanetta Chou M.D.   On: 07/08/2024 14:50    Cardiac Studies   Echo: See above  Patient Profile     68 y.o. female with non-ischemic cardiomyopathy and metastatic bladder cancer who presents with worsening tachycardia and hypotension. Consult by Ms. Bowser NP, in the setting of critical illness.     Assessment & Plan    Metastatic bladder cancer with bone metastasis: Supportive care per primary team  Sepsis with lactic acidosis likely shock from both sepsis and cardiogenic:   CVP is 13.  No CoOx.  I/O [phone].  Continue vasopressors.  I don't think right heart continuous monitoring will add much useful information to this picture.  OK to trial dobutamine watching for arrhythmias.     ICD: Per family decision her ICD VT suppression has been turned off.  For questions or updates, please contact [facility] Please consult [email or web address] for contact info under Cardiology/STEMI.   Signed, Lynwood Schilling, MD  07/10/2024, 8:13 AM

## 2024-07-10 NOTE — Progress Notes (Signed)
 [name]   DOB:[birth year]   FM#:[id number]    ASSESSMENT & PLAN:  [name] is 68 y.o. w with history of bladder cancer with bone mets, chronic pain on methadone , HFrEF NICM w AICD, COPD who was admitted to [facility] 07/08/24 after presenting to [facility] ED for hematemesis and diarrhea.  Patient was seen at bedside with family, and ICU team.  Report of rapid decompensation including heart failure, anuric and hypotension.  She is currently intubated.  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  Recent PET scan showed excellent response which I discussed with patient's daughter. At that time no signs of colitis or acute issues. Her EF was found to to be 20%.  BNP was over 35,000 with acute cardiogenic shock.  She has multiple comorbidities. Renal function stable. LFT worse today likely from cardiogenic shock.  Ct concerns for colitis. Clinically without diarrhea and no symptoms of abdominal pain prior to admission though does not seem to have clear infectious source. I think trial of steroid is reasonable to see if that help the colitis in case there is ICI associated colitis. Assessment & Plan [facility]) Cardiogenic, ?septic Appreciate ICU and cardiology management History of bladder cancer In remission She appears to have complete response on PET. Study showed likely very good outcome in those patients achieve CR from bladder cancer standpoint. This can be followed as outpatient Colitis Trial of steroid 1 mg/kg per day   Thank you for the consult. Will follow with you.  Crystal Crystal Chihuahua, MD 07/10/2024 7:45 AM  Subjective:  [name] intubated and sedated. Report less pressor and had some r/o. LFT worse today. No diarrhea.  Objective:  Vitals:   07/10/24 0615 07/10/24 0630  BP:    Pulse:    Resp: (!) 25 (!) 25  Temp:    SpO2:       Intake/Output Summary (Last 24 hours) at 07/10/2024 0745 Last data filed at 07/10/2024 0734 Gross per 24 hour  Intake 5170.71 ml   Output 650 ml  Net 4520.71 ml    GENERAL: intubated SKIN: skin color pale EYES:  closed OROPHARYNX: intubated LUNGS: intubated HEART: tachycardic ABDOMEN: abdomen soft, non-tender and non-distended NEURO: sedated   Labs:  Recent Labs    07/09/24 0846 07/09/24 1431 07/09/24 1438 07/09/24 1640 07/10/24 0403  NA 143   < > 145 145 141  K 4.1   < > 3.5 3.9 3.2*  CL 109  --  106 102 97*  CO2 8*  --   --  14* 22  GLUCOSE 148*  --  237* 260* 254*  BUN 14  --  13 15 17   CREATININE 1.25*  --  1.30* 1.48* 1.69*  CALCIUM  8.8*  --   --  9.2 8.4*  GFRNONAA 46*  --   --  38* 32*  PROT 6.6  --   --  5.5* 5.4*  ALBUMIN  3.6  --   --  2.9* 3.0*  AST 183*  --   --  566* 2,176*  ALT 64*  --   --  228* 909*  ALKPHOS 86  --   --  100 92  BILITOT 0.6  --   --  0.9 0.5   < > = values in this interval not displayed.    Studies:  CT HEAD WO CONTRAST ( ) Result Date: 07/09/2024 EXAM: CT HEAD WITHOUT CONTRAST 07/09/2024 03:23:12 PM TECHNIQUE: CT of the head was performed without the administration of intravenous contrast. Automated exposure  control, iterative reconstruction, and/or weight based adjustment of the mA/kV was utilized to reduce the radiation dose to as low as reasonably achievable. COMPARISON: CT head 11/02/2018. CLINICAL HISTORY: AMS. FINDINGS: BRAIN AND VENTRICLES: No acute hemorrhage. No evidence of acute infarct. Redemonstrated remote lacunar infarct in the right basal ganglia. Mild chronic microvascular ischemic changes. Similar mild age related volume loss. No hydrocephalus. No extra-axial collection. No mass effect or midline shift. ORBITS: No acute abnormality. SINUSES: No acute abnormality. SOFT TISSUES AND SKULL: No acute soft tissue abnormality. No skull fracture. IMPRESSION: 1. No acute intracranial abnormality. 2. Remote lacunar infarct in the right basal ganglia, unchanged. 3. Mild chronic microvascular ischemic changes and mild volume loss. Electronically signed by:  Donnice Mania MD 07/09/2024 04:04 PM EST RP Workstation: [id number]   ECHOCARDIOGRAM COMPLETE Result Date: 07/09/2024    ECHOCARDIOGRAM REPORT   Patient Name:   [name] [facility] Date of Exam: 07/09/2024 Medical Rec #:  [id number]      Height:       60.0 in Accession #:    [id number]     Weight:       120.0 lb Date of Birth:  [birth year]      BSA:          1.502 m Patient Age:    68 years       BP:           93/74 mmHg Patient Gender: F              HR:           86 bpm. Exam Location:  Inpatient Procedure: 2D Echo, Cardiac Doppler, Color Doppler and Intracardiac            Opacification Agent (Both Spectral and Color Flow Doppler were            utilized during procedure). Indications:    CHF-Acute Systolic I50.21  History:        Patient has prior history of Echocardiogram examinations, most                 recent 06/25/2023. Cardiomyopathy; CAD.  Sonographer:    [name] Referring Phys: [phone] GRACE E BOWSER IMPRESSIONS  1. Left ventricular ejection fraction, by estimation, is <20%. The left ventricle has severely decreased function. The left ventricle demonstrates regional wall motion abnormalities (see scoring diagram/findings for description). The left ventricular internal cavity size was moderately dilated. Left ventricular diastolic function could not be evaluated.  2. Significant left ventricular apical contrast swirling suggestive of sluggish flow without any obvious apical thrombus.  3. Right ventricular systolic function is mildly reduced. The right ventricular size is mildly enlarged. Moderately increased right ventricular wall thickness.  4. Left atrial size was mildly dilated.  5. The mitral valve is normal in structure. Mild mitral valve regurgitation. No evidence of mitral stenosis.  6. Tricuspid valve regurgitation is moderate.  7. The aortic valve is normal in structure. Aortic valve regurgitation is not visualized. No aortic stenosis is present.  8. The inferior vena cava is dilated in size  with <50% respiratory variability, suggesting right atrial pressure of 15 mmHg. Comparison(s): A prior study was performed on 06/25/2023. The ejection fraction was 55-60% with grade I diastolic dysfunction and no regional wall motion abnormalities however on personal read the ejection fraction was likely mildly reduced with septal hypokinesis. Conclusion(s)/Recommendation(s): Critical findings reported to Dr. Paula Holster. FINDINGS  Left Ventricle: Significant left ventricular apical contrast swirling suggestive of sluggish flow without  any obvious apical thrombus. Left ventricular ejection fraction, by estimation, is <20%. The left ventricle has severely decreased function. The left ventricle demonstrates regional wall motion abnormalities. Definity contrast agent was given IV to delineate the left ventricular endocardial borders. The left ventricular internal cavity size was moderately dilated. There is no left ventricular hypertrophy. Left ventricular diastolic function could not be evaluated.  LV Wall Scoring: The entire anterior wall, antero-lateral wall, mid and distal lateral wall, entire septum, entire apex, and mid and distal inferior wall are akinetic. The basal inferolateral segment and basal inferior segment are hypokinetic. Right Ventricle: The right ventricular size is mildly enlarged. Moderately increased right ventricular wall thickness. Right ventricular systolic function is mildly reduced. Left Atrium: Left atrial size was mildly dilated. Right Atrium: Right atrial size was normal in size. Pericardium: There is no evidence of pericardial effusion. Mitral Valve: The mitral valve is normal in structure. There is mild thickening of the mitral valve leaflet(s). Mild mitral valve regurgitation. No evidence of mitral valve stenosis. Tricuspid Valve: The tricuspid valve is not well visualized. Tricuspid valve regurgitation is moderate . No evidence of tricuspid stenosis. Aortic Valve: The aortic valve  is normal in structure. Aortic valve regurgitation is not visualized. No aortic stenosis is present. Aortic valve mean gradient measures 1.0 mmHg. Aortic valve peak gradient measures 2.5 mmHg. Aortic valve area, by VTI measures 1.98 cm. Pulmonic Valve: The pulmonic valve was normal in structure. Pulmonic valve regurgitation is not visualized. No evidence of pulmonic stenosis. Aorta: The aortic root is normal in size and structure. Venous: The inferior vena cava is dilated in size with less than 50% respiratory variability, suggesting right atrial pressure of 15 mmHg. IAS/Shunts: No atrial level shunt detected by color flow Doppler. Additional Comments: A device lead is visualized in the right atrium and right ventricle.  LEFT VENTRICLE PLAX 2D LVIDd:         6.40 cm LVIDs:         5.70 cm LV PW:         0.80 cm LV IVS:        0.80 cm LVOT diam:     1.95 cm LV SV:         20 LV SV Index:   13 LVOT Area:     2.99 cm  RIGHT VENTRICLE            IVC RV S prime:     8.49 cm/s  IVC diam: 2.00 cm TAPSE (M-mode): 2.4 cm LEFT ATRIUM             Index        RIGHT ATRIUM           Index LA Vol (A2C):   27.6 ml 18.37 ml/m  RA Area:     11.90 cm LA Vol (A4C):   53.3 ml 35.48 ml/m  RA Volume:   24.40 ml  16.24 ml/m LA Biplane Vol: 41.5 ml 27.62 ml/m  AORTIC VALVE AV Area (Vmax):    2.00 cm AV Area (Vmean):   1.84 cm AV Area (VTI):     1.98 cm AV Vmax:           78.90 cm/s AV Vmean:          50.200 cm/s AV VTI:            0.100 m AV Peak Grad:      2.5 mmHg AV Mean Grad:      1.0 mmHg LVOT Vmax:  52.90 cm/s LVOT Vmean:        31.000 cm/s LVOT VTI:          0.066 m LVOT/AV VTI ratio: 0.66  AORTA Ao Root diam: 3.20 cm MITRAL VALVE               TRICUSPID VALVE MV Area (PHT): 3.86 cm    TR Peak grad:   30.0 mmHg MV E velocity: 48.80 cm/s  TR Vmax:        274.00 cm/s                             SHUNTS                            Systemic VTI:  0.07 m                            Systemic Diam: 1.95 cm Emeline Calender  Electronically signed by Emeline Calender Signature Date/Time: 07/09/2024/1:23:07 PM    Final    DG Chest Port 1 View Result Date: 07/09/2024 CLINICAL DATA:  Ventilator dependence.  Endotracheal tube placement. EXAM: PORTABLE CHEST 1 VIEW COMPARISON:  07/09/2024 FINDINGS: Endotracheal tube tip is positioned at the carina. This could be withdrawn about 2 cm for tip placement in the distal trachea. The NG tube passes into the stomach although the distal tip position is not included on the film. The cardio pericardial silhouette is enlarged. Similar asymmetric elevation right hemidiaphragm. Bibasilar atelectasis or infiltrate with probable small layering bilateral pleural effusions. Right Port-A-Cath again noted. Left-sided permanent pacemaker evident. Telemetry leads overlie the chest. Gaseous distention of the colon is identified in the right upper quadrant with gaseous distention of the stomach seen in the left subdiaphragmatic region. IMPRESSION: 1. Endotracheal tube tip is positioned at the carina. This could be withdrawn about 2 cm for tip placement in the distal trachea. 2. Bibasilar atelectasis or infiltrate with probable small layering bilateral pleural effusions. Critical Value/emergent results were called by telephone at the time of interpretation on 07/09/2024 at 7:26 am to ER Provider , who verbally acknowledged these results. Electronically Signed   By: Camellia Candle M.D.   On: 07/09/2024 07:27   DG Chest Port 1 View Result Date: 07/09/2024 EXAM: 1 VIEW(S) XRAY OF THE CHEST 07/09/2024 06:30:00 AM COMPARISON: 11/06/2023 CLINICAL HISTORY: sob FINDINGS: LINES, TUBES AND DEVICES: Right chest port with tip at cavoatrial junction. Left chest AICD noted. LUNGS AND PLEURA: Low lung volumes. Bilateral interstitial opacities. Elevated right hemidiaphragm with right basilar atelectasis. No pleural effusion. No pneumothorax. HEART AND MEDIASTINUM: Aortic atherosclerosis. No acute abnormality of the cardiac and  mediastinal silhouettes. BONES AND SOFT TISSUES: Cervical fusion hardware. Gaseous bowel distention. IMPRESSION: 1. Low lung volumes with bilateral interstitial opacities and right basilar atelectasis. 2. Elevated right hemidiaphragm. Electronically signed by: Evalene Coho MD 07/09/2024 06:38 AM EST RP Workstation: [id number]   CT ABDOMEN PELVIS W CONTRAST Result Date: 07/08/2024 CLINICAL DATA:  Abdominal pain, nausea and vomiting. Bladder cancer. EXAM: CT ABDOMEN AND PELVIS WITH CONTRAST TECHNIQUE: Multidetector CT imaging of the abdomen and pelvis was performed using the standard protocol following bolus administration of intravenous contrast. RADIATION DOSE REDUCTION: This exam was performed according to the departmental dose-optimization program which includes automated exposure control, adjustment of the mA and/or kV according to patient size and/or use of iterative reconstruction technique. CONTRAST:  80mL OMNIPAQUE  IOHEXOL  300 MG/ML  SOLN COMPARISON:  Pelvic CT dated 11/17/2023. FINDINGS: Evaluation of this exam is limited due to respiratory motion and streak artifact caused by patient's arms and pacemaker. Lower chest: There is eventration of the right hemidiaphragm with right lung base atelectasis. Pneumonia is not excluded. Cardiac pacemaker noted. No intra-abdominal free air or free fluid. Hepatobiliary: The liver is unremarkable. There is mild dilatation, post cholecystectomy. No retained calcified stone noted in the central CBD. Pancreas: Unremarkable. No pancreatic ductal dilatation or surrounding inflammatory changes. Spleen: Normal in size without focal abnormality. Adrenals/Urinary Tract: The adrenal glands unremarkable. Small bilateral renal cysts and subcentimeter hypodense lesions which are too small to characterize. There is a mild right hydronephrosis. Right pigtail ureteral stent with proximal tip in the proximal right ureter distal end in the urinary bladder. No hydronephrosis on the  left. There is delayed excretion of contrast by the right kidney. The urinary bladder is decompressed around a Foley catheter. Stomach/Bowel: There is diffuse thickened appearance of the colon consistent with pancolitis. There is no bowel obstruction. Appendectomy. Vascular/Lymphatic: Mild aortoiliac atherosclerotic disease. The IVC is unremarkable. No portal venous gas. No adenopathy. Reproductive: The uterus is poorly visualized and not evaluated. Other: None Musculoskeletal: Osteopenia with degenerative changes of spine. Total right hip arthroplasty with associated streak artifact limiting evaluation the pelvic structures. No acute osseous pathology. IMPRESSION: 1. Pancolitis. No bowel obstruction. 2. Mild right hydronephrosis. Right pigtail ureteral stent with proximal tip in the proximal right ureter. 3.  Aortic Atherosclerosis (ICD10-I70.0). Electronically Signed   By: Vanetta Chou M.D.   On: 07/08/2024 14:50   NM PET Image Restag (PS) Skull Base To Thigh Result Date: 07/02/2024 EXAM: PET AND CT SKULL BASE TO MID THIGH 07/01/2024 02:22:24 PM TECHNIQUE: RADIOPHARMACEUTICAL: 6.31 mCi F-18 FDG Uptake time 60 minutes. Glucose level 98 mg/dl. Blood pool SUV 2.2. PET imaging was acquired from the base of the skull to the mid thighs. Non-contrast enhanced computed tomography was obtained for attenuation correction and anatomic localization. COMPARISON: 02/12/2024 CLINICAL HISTORY: Follow up to evaluate treatment response from bladder cancer. FINDINGS: HEAD AND NECK: No metabolically active cervical lymphadenopathy. CHEST: No metabolically active pulmonary nodules. No metabolically active lymphadenopathy. Injection related activity in the right port-a-cath port. AICD noted. Right port-a-cath tip in SVC. Atheromatous vascular calcification of the aortic arch. ABDOMEN AND PELVIS: No metabolically active intraperitoneal mass. No metabolically active lymphadenopathy. Scattered presumed physiologic activity in  bowel. Accentuated pelvic activity with CT data obscured by streak artifact from the right hip implant. This activity may be in the distal ureter or in the vicinity of the cervix/lower uterine segment. Maximum SUV 10.5. Cholecystectomy. Right double J ureteral stent noted with proximal loop formed in the proximal ureter and stranding on the right renal sinus adipose tissues and perirenal space greater than the left. Foley catheter in the urinary bladder. 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 Right total hip prosthesis. Findings of remote sacral fracture noted. IMPRESSION: 1. No evidence of metabolically active disease related to bladder cancer. 2. Focal pelvic hypermetabolism with maximum SUV 10.5, localization limited by streak artifact from right hip prosthesis; consider dedicated pelvic evaluation to determine origin (distal ureter versus cervix/lower uterine segment). 3. Prior osseous metastatic sites are not hypermetabolic. 4. Degenerative changes of the right hip status post total hip arthroplasty. 5. Findings of a remote sacral fracture. Electronically signed by: Ryan Salvage MD 07/02/2024 04:24 PM EST RP Workstation: [id number]

## 2024-07-10 NOTE — Assessment & Plan Note (Signed)
 Cardiogenic, ?septic Appreciate ICU and cardiology management

## 2024-07-10 NOTE — Progress Notes (Signed)
 eLink Physician-Brief Progress Note Patient Name: [name] DOB: 11-May-1956 MRN: [id number]   Date of Service  07/10/2024  HPI/Events of Note  Repeat lactic acid is > 9.0, attempt to treat with Epinephrine  gtt as an Inotrope earlier precipitated cardiac instability and was aborted, cardiology following. Also has elevation of Troponin.  eICU Interventions  Will ask cardiology to reassess.        Joell Buerger U Andee Chivers 07/10/2024, 2:36 AM

## 2024-07-10 NOTE — Progress Notes (Signed)
 eLink Physician-Brief Progress Note Patient Name: [name] DOB: 1955-10-15 MRN: [id number]   Date of Service  07/10/2024  HPI/Events of Note  Troponin 655 -> 688  eICU Interventions  Aspirin  ordered.        Quintana Canelo U Shakevia Sarris 07/10/2024, 1:16 AM

## 2024-07-10 NOTE — Procedures (Signed)
 Extubation Procedure Note  Patient Details:   Name: [name] DOB: 1955-09-13 MRN: [id number]   Airway Documentation:    Vent end date: 07/10/24 Vent end time: 1825   Evaluation  O2 sats: currently acceptable Complications: No apparent complications Patient did tolerate procedure well, DNR/DNI Bilateral Breath Sounds: Diminished, Fine crackles   No  Pt terminally exutbated per MD order. Pt placed on 2L [place].  RN and family at bedside.  Joesph DELENA Maffucci 07/10/2024, 6:32 PM

## 2024-07-10 NOTE — IPAL (Signed)
  Interdisciplinary Goals of Care Family Meeting   Date carried out:: 07/10/2024  Location of the meeting: Bedside  Member's involved: Physician, Bedside Registered Nurse, Social Worker, and Family Member or next of kin  Durable Power of Attorney or acting medical decision maker: Daughter, [name] Fredna and  Kelby Nancyann Ku, and two sons  Discussion: We discussed goals of care for [name]. I discussed with family that patient is currently in multiorgan failure due to mixed cardiogenic and septic shock. I discussed moribund prognosis. I recommended transitioning to comfort measures. The family was allowed time to ask questions. They all decided together to pursue comfort measures. We prayed at bedside with the priest as well. Will pursue CMO.  Code status: Full DNR  Disposition: In-patient comfort care  Time spent for the meeting: 25 minutes.  Aydan Levitz 07/10/2024, 3:34 PM

## 2024-07-11 DIAGNOSIS — A419 Sepsis, unspecified organism: Secondary | ICD-10-CM | POA: Diagnosis not present

## 2024-07-11 DIAGNOSIS — I5023 Acute on chronic systolic (congestive) heart failure: Secondary | ICD-10-CM | POA: Diagnosis not present

## 2024-07-11 DIAGNOSIS — R6521 Severe sepsis with septic shock: Secondary | ICD-10-CM | POA: Diagnosis not present

## 2024-07-11 DIAGNOSIS — J9601 Acute respiratory failure with hypoxia: Secondary | ICD-10-CM | POA: Diagnosis not present

## 2024-07-11 NOTE — Plan of Care (Signed)
  Problem: Clinical Measurements: Goal: Diagnostic test results will improve Outcome: Not Progressing Goal: Cardiovascular complication will be avoided Outcome: Not Progressing   Problem: Activity: Goal: Risk for activity intolerance will decrease Outcome: Not Progressing   Problem: Nutrition: Goal: Adequate nutrition will be maintained Outcome: Not Progressing

## 2024-07-11 NOTE — Progress Notes (Addendum)
 NAME:  [name], MRN:  [id number], DOB:  [birth year], LOS: 3 ADMISSION DATE:  07/08/2024, CONSULTATION DATE:  07/11/24 REFERRING MD:  ED, CHIEF COMPLAINT:  shock   History of Present Illness:  [name] is a 68 y/o F with a PMH significant for bladder cancer w/ bone metastasis, chronic pain on methadone , HFrEF s/p AICD (recovered EF 55-60% on 05/2023), and COPD who presented to [facility] ED for N/V and diarrhea thought to be due to pancolitis found to have mixed cardiogenic and septic shock requiring invasive mechanical ventilation and hemodynamic support with vasopressors.   Pertinent  Medical History  Bladder ca mets to bone COPD NICM HFrEF s/p AICD Significant Hospital Events: Including procedures, antibiotic start and stop dates in addition to other pertinent events   Admitted to [facility] on 11/13 for N/V 11/14 early 7 AM, obtunded, hematemesis, intubated. Admitted to ICU. Profound mixed cardiogenic and septic shock. Started on broad spectrum antimicrobials, vasopressors, and steroids 11/15, remains intubated and on vasoactive substances, transitioned to comfort measures, compassionately extubated and placed on fentanyl  drip  Interim History / Subjective:  Unable to obtain due to clinical status. Appears to be comfortable. Family at bedside. Objective    Blood pressure (!) 53/31, pulse 82, temperature 98.3 F (36.8 C), temperature source Axillary, resp. rate (!) 5, height 5' (1.524 m), weight 54.4 kg, SpO2 96%.    Vent Mode: PRVC FiO2 (%):  [100 %] 100 % Set Rate:  [24 bmp] 24 bmp Vt Set:  [360 mL] 360 mL PEEP:  [5 cmH20] 5 cmH20 Plateau Pressure:  [13 cmH20] 13 cmH20   Intake/Output Summary (Last 24 hours) at 07/11/2024 0948 Last data filed at 07/11/2024 9191 Gross per 24 hour  Intake 2785.6 ml  Output --  Net 2785.6 ml  Total + 5.5 L Filed Weights   07/08/24 1719  Weight: 54.4 kg    Examination: General: not awake, does not respond to verbal or tactile  stimulation HENT: pupils pinpoint Lungs: RR around 6, clear breath sounds Cardiovascular: tachycardic, irregular rhythm Abdomen: distended, soft, non-tender Extremities: 1+ pitting edema bilaterally Neuro: does not respond to commands GU: Deferred  Resolved problem list   Assessment and Plan   #Acute on chronic systolic heart failure #Cardiogenic and Septic Shock #Pancolitis #Acute Hypoxic Respiratory Failure #Shock Liver #AKI #Multiorgan failure syndrome  Patient was transitioned to comfort measures only on 07/10/24. She appears to be comfortable on fentanyl  drip and precedex drip. We will continue current symptomatic management and continue to engage family and answer questions.  Disposition: ICU appropriate for precedex drip.  Labs   CBC: Recent Labs  Lab 07/08/24 1117 07/09/24 0030 07/09/24 0846 07/09/24 1323 07/09/24 1431 07/09/24 1438 07/09/24 1640 07/10/24 0403  WBC 10.5  --  14.1*  --   --   --  15.3* 20.6*  NEUTROABS 8.3*  --   --   --   --   --   --   --   HGB 9.8*   < > 11.9*  --  8.2* 11.9* 10.3* 9.6*  HCT 29.8*   < > 36.3  --  24.0* 35.0* 33.6* 29.0*  MCV 90.6  --  92.8  --   --   --  96.8 91.8  PLT 240  --  PLATELET CLUMPS NOTED ON SMEAR, COUNT APPEARS DECREASED 155  --   --  147* 152   < > = values in this interval not displayed.    Basic Metabolic Panel: Recent Labs  Lab 07/08/24 1117 07/09/24 0030 07/09/24 0846 07/09/24 1431 07/09/24 1438 07/09/24 1440 07/09/24 1640 07/10/24 0403 07/10/24 1031  NA 143 140 143 144 145  --  145 141 141  K 2.5* 3.5 4.1 3.5 3.5  --  3.9 3.2* 4.9  CL 107 108 109  --  106  --  102 97* 98  CO2 21* 16* 8*  --   --   --  14* 22 22  GLUCOSE 108* 124* 148*  --  237*  --  260* 254* 215*  BUN 16 12 14   --  13  --  15 17 21   CREATININE 1.16* 1.00 1.25*  --  1.30*  --  1.48* 1.69* 1.84*  CALCIUM  8.8* 8.9 8.8*  --   --   --  9.2 8.4* 9.7  MG 1.1* 2.3 2.3  --   --  3.0*  --   --   --   PHOS  --   --  5.0*  --   --    --   --   --   --    GFR: Estimated Creatinine Clearance: 20.4 mL/min (A) (by C-G formula based on SCr of 1.84 mg/dL (H)). Recent Labs  Lab 07/08/24 1117 07/09/24 0846 07/09/24 0929 07/09/24 1640 07/09/24 2201 07/10/24 0016 07/10/24 0403 07/10/24 1031  WBC 10.5 14.1*  --  15.3*  --   --  20.6*  --   LATICACIDVEN  --   --    < > >9.0* >9.0* >9.0*  --  >9.0*   < > = values in this interval not displayed.    Liver Function Tests: Recent Labs  Lab 07/08/24 1117 07/09/24 0846 07/09/24 1640 07/10/24 0403 07/10/24 1031  AST 15 183* 566* 2,176* 4,665*  ALT <5 64* 228* 909* 1,552*  ALKPHOS 53 86 100 92 75  BILITOT 0.3 0.6 0.9 0.5 0.5  PROT 6.1* 6.6 5.5* 5.4* 4.5*  ALBUMIN  3.2* 3.6 2.9* 3.0* 2.5*   Recent Labs  Lab 07/08/24 1117  LIPASE 15   No results for input(s): AMMONIA in the last 168 hours.  ABG    Component Value Date/Time   PHART 7.49 (H) 07/10/2024 1044   PCO2ART 22 (L) 07/10/2024 1044   PO2ART 96 07/10/2024 1044   HCO3 16.8 (L) 07/10/2024 1044   TCO2 19 (L) 07/09/2024 1438   ACIDBASEDEF 5.4 (H) 07/10/2024 1044   O2SAT 97.2 07/10/2024 1044   O2SAT 98 07/10/2024 1044     Coagulation Profile: Recent Labs  Lab 07/08/24 1117 07/09/24 1323  INR 1.1 1.6*    Cardiac Enzymes: No results for input(s): CKTOTAL, CKMB, CKMBINDEX, TROPONINI in the last 168 hours.  HbA1C: Hgb A1c MFr Bld  Date/Time Value Ref Range Status  07/09/2024 04:40 PM 5.3 4.8 - 5.6 % Final    Comment:    (NOTE) Diagnosis of Diabetes The following HbA1c ranges recommended by the American Diabetes Association (ADA) may be used as an aid in the diagnosis of diabetes mellitus.  Hemoglobin             Suggested A1C NGSP%              Diagnosis  <5.7                   Non Diabetic  5.7-6.4                Pre-Diabetic  >6.4  Diabetic  <7.0                   Glycemic control for                       adults with diabetes.    06/23/2023 03:16 PM 5.5  4.8 - 5.6 % Final    Comment:             Prediabetes: 5.7 - 6.4          Diabetes: >6.4          Glycemic control for adults with diabetes: <7.0     CBG: Recent Labs  Lab 07/09/24 2206 07/10/24 0020 07/10/24 0410 07/10/24 0758 07/10/24 1122  GLUCAP 267* 281* 244* 226* 204*    Review of Systems:   Not obtained.  Past Medical History:  She,  has a past medical history of Abnormality of gait due to impairment of balance (11/22/2020), Admission for long-term opiate analgesic use (10/24/2019), AKI (acute kidney injury) (11/14/2023), Anemia of chronic disease (06/07/2023), Arthralgia of both hands (04/07/2023), Arthritis of right hip (05/29/2016), Arthritis of right knee (11/27/2016), At risk for side effect of medication (12/18/2023), Bilateral lower extremity edema (06/07/2023), BMI 26.0-26.9,adult (03/29/2020), Bone lesion (11/18/2023), Cardiomyopathy (HCC), Chronic active hepatitis (HCC) (01/04/2023), Chronic back pain, Chronic hypoxemic respiratory failure (HCC) (10/24/2019), Chronic narcotic use (03/23/2015), Chronic pain of both knees (12/21/2018), Chronic systolic congestive heart failure, NYHA class 2 (HCC) (06/19/2017), CKD (chronic kidney disease), stage III (HCC) (12/01/2023), 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), Community acquired pneumonia of left lower lobe of lung (09/25/2023), COPD (chronic obstructive pulmonary disease) (HCC), COPD exacerbation (HCC) (01/04/2023), Coronary artery disease involving native coronary artery of native heart without angina pectoris (05/31/2015), Decreased appetite (12/18/2023), Depression, major, recurrent, moderate (HCC) (10/24/2019), Diarrhea (05/17/2020), Dietary folate deficiency anemia (11/21/2023), Dilated cardiomyopathy (HCC) (06/19/2017), Diverticulosis, 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), 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 (06/19/2017), Dyslipidemia (05/31/2015), Elevated serum glucose (06/23/2023), Fatigue (06/23/2023),  Folate deficiency (01/08/2024), GERD (gastroesophageal reflux disease), History of total hip replacement (09/25/2016), 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), Hypotension (05/17/2020), ICD (implantable cardioverter-defibrillator) in place (06/19/2017), Ileus following gastrointestinal surgery (HCC) (03/26/2015), Iron deficiency (11/21/2023), Low vitamin B12 level (11/21/2023), Major depressive disorder, single episode, moderate (HCC) (10/24/2019), Malnutrition of moderate degree (10/20/2020), Mesenteric ischemia (10/16/2020), Migraine without aura and without status migrainosus, not intractable (01/04/2023), Mixed hyperlipidemia (05/31/2015), Mixed incontinence (10/20/2020), Moderate protein-calorie malnutrition (11/03/2023), Nausea with vomiting, Nausea without vomiting (01/29/2024), Nephrostomy present (HCC) (12/01/2023), Neuropathy (01/29/2024), Normocytic anemia (06/23/2023), Osteoporosis (10/24/2019), Other spondylosis with radiculopathy, lumbar region (10/24/2019), Paraplegia (HCC) (11/14/2023), Presence of left artificial hip joint (10/24/2019), Primary insomnia (01/04/2023), PTSD (post-traumatic stress disorder), Renal lesion (11/05/2023), Rheumatoid arthritis involving multiple sites (HCC) (04/07/2023), Senile osteoporosis (10/24/2019), Serosanguineous chronic otitis media of right ear (08/02/2021), Therapeutic opioid induced constipation (01/04/2023), Thyroid  disease, Urge incontinence of urine (11/03/2023), Urothelial carcinoma of bladder (HCC) (11/18/2023), Vertebral fracture, pathological (12/01/2023), and Vitamin D  deficiency (09/25/2023).   Surgical History:   Past Surgical History:  Procedure Laterality Date   APPENDECTOMY     CARDIAC CATHETERIZATION     CARPAL TUNNEL RELEASE     CESAREAN SECTION     CHF s/p AICD   12/2010   CHOLECYSTECTOMY     COLONOSCOPY  10/16/2015   Mild colonic diverticulosis, predominantly in the left colon. Small internal hemrrhoids. Otherwise normal colonoscopy    COLONOSCOPY WITH PROPOFOL  N/A 09/27/2021   Procedure: COLONOSCOPY WITH PROPOFOL ;  Surgeon: Shila Gustav GAILS, MD;  Location: [facility];  Service:  Endoscopy;  Laterality: N/A;   CORONARY ANGIOPLASTY     ESOPHAGOGASTRODUODENOSCOPY  02/04/2014   Mild gastritis. Otherwise normal EGD   HAND SURGERY Bilateral    ICD GENERATOR CHANGEOUT N/A 05/21/2019   Procedure: ICD GENERATOR CHANGEOUT;  Surgeon: Inocencio Soyla Lunger, MD;  Location: [facility];  Service: Cardiovascular;  Laterality: N/A;   ICD IMPLANT     [employer]   intestinal blockage 2011     IR IMAGING GUIDED PORT INSERTION  12/24/2023   IR NEPHRO TUBE REMOV/FL  06/02/2024   IR NEPHROSTOMY PLACEMENT RIGHT  11/16/2023   IR URETERAL STENT PLACEMENT EXISTING ACCESS RIGHT  12/24/2023   LAPAROSCOPIC ASSISTED VENTRAL HERNIA REPAIR N/A 03/23/2015   Procedure: LAPAROSCOPIC VENTRAL WALL HERNIA REPAIR;  Surgeon: Elspeth Schultze, MD;  Location: [facility];  Service: General;  Laterality: N/A;  With MESH   LAPAROSCOPIC LYSIS OF ADHESIONS N/A 03/23/2015   Procedure: LAPAROSCOPIC LYSIS OF ADHESIONS;  Surgeon: Elspeth Schultze, MD;  Location: [facility];  Service: General;  Laterality: N/A;   LSCS      x2   NASAL SEPTUM SURGERY     NECK SURGERY     fused   TONSILLECTOMY     TRANSURETHRAL RESECTION OF BLADDER TUMOR N/A 11/16/2023   Procedure: TURBT (TRANSURETHRAL RESECTION OF BLADDER TUMOR);  Surgeon: [name] Lonni Righter, MD;  Location: [facility];  Service: Urology;  Laterality: N/A;   TYMPANOSTOMY TUBE PLACEMENT Right 2024   ULNAR NERVE TRANSPOSITION  01/23/2012   Procedure: ULNAR NERVE DECOMPRESSION/TRANSPOSITION;  Surgeon: Reyes JONETTA Budge, MD;  Location: [facility];  Service: Neurosurgery;  Laterality: Left;  LEFT ulnar nerve decompression     Social History:   reports that she quit smoking about 4 years ago. Her smoking use included cigarettes. She started smoking about 34 years ago. She has a 15 pack-year smoking history. She has never used  smokeless tobacco. She reports current alcohol use of about 2.0 standard drinks of alcohol per week. She reports that she does not use drugs.   Family History:  Her family history includes Alcohol abuse in her father; Cancer in an other family member; Heart attack in her father, mother, and another family member; Heart failure in an other family member; Hypertension in her father and mother. There is no history of Anesthesia problems, Hypotension, Malignant hyperthermia, Pseudochol deficiency, or Breast cancer.   Allergies Allergies  Allergen Reactions   Lyrica  [Pregabalin ] Other (See Comments)    Jerking, twitching, memory changes      Home Medications  Prior to Admission medications   Medication Sig Start Date End Date Taking? Authorizing Provider  ARIPiprazole  (ABILIFY ) 5 MG tablet TAKE 1 TABLET BY MOUTH DAILY 10/07/23   Cox, Kirsten, MD  aspirin  81 MG tablet Take 81 mg by mouth daily.    [provider]  BREO ELLIPTA  100-25 MCG/ACT AEPB INHALE ONE (1) PUFF BY MOUTH DAILY 05/25/24   Teressa Harrie HERO, FNP  Carboxymethylcell-Glycerin  PF 0.5-0.9 % SOLN Place 2 drops into both eyes 4 (four) times daily. Patient not taking: Reported on 07/07/2024 12/01/23   Tina Pauletta BROCKS, MD  carvedilol  (COREG ) 3.125 MG tablet TAKE ONE (1) TABLET BY MOUTH TWICE DAILY WITH MEALS Patient taking differently: Take 3.125 mg by mouth 2 (two) times daily with a meal. 11/26/23   Teressa Harrie HERO, FNP  denosumab  (PROLIA ) 60 MG/ML SOSY injection Inject 60 mg into the skin every 6 (six) months. 02/14/20   Abran Jerilynn Loving, MD  dextromethorphan  15 MG/5ML syrup  Take 10 mLs (30 mg total) by mouth 4 (four) times daily as needed for cough. 10/22/23   Teressa Harrie HERO, FNP  diclofenac  Sodium (VOLTAREN ) 1 % GEL Apply 2 g topically 4 (four) times daily. 05/01/21   Abran Jerilynn Loving, MD  diphenoxylate -atropine  (LOMOTIL ) 2.5-0.025 MG tablet Take 1 tablet by mouth 4 (four) times daily as needed for diarrhea or loose stools.  07/04/24   Pickenpack-Cousar, Athena N, NP  dronabinol  (MARINOL ) 5 MG capsule Take 1 capsule (5 mg total) by mouth 2 (two) times daily before lunch and supper. 03/15/24   Pickenpack-Cousar, Athena N, NP  ENTRESTO  24-26 MG TAKE ONE (1) TABLET BY MOUTH TWICE DAILY 11/26/23   Teressa Harrie HERO, FNP  EPINEPHRINE  0.3 mg/0.3 mL IJ SOAJ injection Inject 0.3 mLs (0.3 mg total) into the muscle as needed for anaphylaxis. Patient not taking: Reported on 07/07/2024 04/27/20   Abran Jerilynn Loving, MD  esomeprazole  (NEXIUM ) 20 MG capsule TAKE ONE (1) CAPSULE BY MOUTH ONCE DAILY 02/24/24   Cox, Kirsten, MD  FARXIGA 5 MG TABS tablet Take 5 mg by mouth daily. 10/10/23   [provider]  ferrous sulfate  325 (65 FE) MG tablet Take 1 tablet (325 mg total) by mouth at bedtime. 11/24/23   Gherghe, Costin M, MD  fluticasone  (FLONASE ) 50 MCG/ACT nasal spray USE 2 SPRAYS IN EACH NOSTRILS DAILY AS NEEDED Patient not taking: Reported on 07/07/2024 07/18/23   Teressa Harrie HERO, FNP  folic acid  (FOLVITE ) 1 MG tablet Take 1 tablet (1 mg total) by mouth daily. Patient not taking: Reported on 07/07/2024 01/08/24   Tina Pauletta BROCKS, MD  furosemide  (LASIX ) 20 MG tablet Take 3 tablets (60 mg total) by mouth daily. 07/07/24   Krasowski, Robert J, MD  ipratropium-albuterol  (DUONEB) 0.5-2.5 (3) MG/3ML SOLN USE 1 VIAL VIA NEBULIZER EVERY 6 HOURS AS NEEDED FOR SHORTNESS OF BREATH Patient taking differently: Take 3 mLs by nebulization every 6 (six) hours as needed (SOB). 10/16/23   Teressa Harrie HERO, FNP  levocetirizine (XYZAL) 5 MG tablet Take 5 mg by mouth daily as needed for allergies. 12/13/19   [provider]  lidocaine  (LIDODERM ) 5 % Place 1 patch onto the skin daily. Remove & Discard patch within 12 hours or as directed by MD Patient not taking: Reported on 07/07/2024 11/24/23   Gherghe, Costin M, MD  lidocaine -prilocaine  (EMLA ) cream Apply to affected area once Patient not taking: Reported on 07/07/2024 12/01/23   Tina Pauletta BROCKS, MD   lubiprostone  (AMITIZA ) 24 MCG capsule TAKE 1 CAPSULE(24 MCG) BY MOUTH TWICE DAILY WITH A MEAL Patient taking differently: Take 24 mcg by mouth 2 (two) times daily with a meal. 09/15/23   Cox, Kirsten, MD  methadone  (DOLOPHINE ) 10 MG tablet Take 1 tablet (10 mg total) by mouth every 8 (eight) hours. Patient taking differently: Take 10 mg by mouth every 8 (eight) hours as needed (for pain). 06/14/24   Pickenpack-Cousar, Athena N, NP  methadone  (DOLOPHINE ) 5 MG tablet Take 1 tablet (5 mg total) by mouth every 8 (eight) hours. (Take in addition to 10mg  (Total 15mg )). 07/01/24   Pickenpack-Cousar, Athena N, NP  methylPREDNISolone  (MEDROL  DOSEPAK) 4 MG TBPK tablet If significant rash, will start at 6 tabs on day 1, then 5 tabs on day 2, then 4 tabs on day 3, then 3 tabs on day 4, then 2 tabs on day 5 and then 1 tab on day 6. Take with food. 12/25/23   Tina Pauletta BROCKS, MD  metoCLOPramide  (REGLAN ) 10  MG tablet Take 1 tablet (10 mg total) by mouth every 8 (eight) hours as needed for nausea. 01/07/24   Pickenpack-Cousar, Athena N, NP  Multiple Vitamin (MULTIVITAMIN WITH MINERALS) TABS tablet Take 1 tablet by mouth daily. 10/30/20   Sebastian Toribio GAILS, MD  naloxone  (NARCAN ) nasal spray 4 mg/0.1 mL Place 1 spray into the nose once. 01/12/21   [provider]  nitroGLYCERIN  (NITROSTAT ) 0.4 MG SL tablet Place 1 tablet (0.4 mg total) under the tongue every 5 (five) minutes as needed for chest pain. 11/27/21   Krasowski, Robert J, MD  ondansetron  (ZOFRAN ) 4 MG tablet Take 1 tablet (4 mg total) by mouth every 8 (eight) hours as needed for nausea or vomiting. 05/17/20   Abran Jerilynn Loving, MD  ondansetron  (ZOFRAN ) 8 MG tablet Take 1 tablet (8 mg total) by mouth every 8 (eight) hours as needed for nausea or vomiting. 12/01/23   Tina Pauletta BROCKS, MD  Oxycodone  HCl 20 MG TABS Take 1 tablet (20 mg total) by mouth every 4 (four) hours as needed. Patient taking differently: Take 20 mg by mouth every 4 (four) hours as needed  (for pain). 07/04/24   Pickenpack-Cousar, Athena N, NP  potassium chloride  (KLOR-CON  M) 10 MEQ tablet Take 2 tablets by mouth twice daily for 3 days, then 1 tablet once daily 06/29/24   Tina Pauletta BROCKS, MD  prochlorperazine  (COMPAZINE ) 10 MG tablet TAKE 1 TABLET(10 MG) BY MOUTH EVERY 6 HOURS AS NEEDED FOR NAUSEA OR VOMITING 04/27/24   Tina Pauletta BROCKS, MD  ranolazine  (RANEXA ) 500 MG 12 hr tablet TAKE 1 TABLET BY MOUTH TWICE DAILY 10/28/23   Krasowski, Robert J, MD  rosuvastatin  (CRESTOR ) 20 MG tablet TAKE 1 TABLET BY MOUTH ONCE DAILY 02/24/24   Cox, Kirsten, MD  sertraline  (ZOLOFT ) 100 MG tablet TAKE 1/2 TABLET(50 MG) BY MOUTH DAILY Patient taking differently: Take 50 mg by mouth daily. 08/24/23   Cox, Abigail, MD  sulfamethoxazole -trimethoprim  (BACTRIM  DS) 800-160 MG tablet Take 1 tablet by mouth 2 (two) times daily. 02/05/24   Tina Pauletta BROCKS, MD  tiZANidine  (ZANAFLEX ) 4 MG tablet Take 1 tablet (4 mg total) by mouth at bedtime. Patient not taking: Reported on 07/07/2024 06/07/24   Pickenpack-Cousar, Athena N, NP  topiramate  (TOPAMAX ) 50 MG tablet TAKE TWO (2) TABLETS BY MOUTH EVERY DAY AT BEDTIME Patient not taking: Reported on 07/07/2024 11/26/23   Teressa Harrie HERO, FNP  Ubrogepant  (UBRELVY ) 100 MG TABS Take 1 tablet (100 mg total) by mouth as needed (May repeat after 2 hours.  Maximum 2 tablets in 24 hours). TAKE 1 TABLET BY MOUTH BY MOUTH AS NEEDED( MAY REPEAT 1 TABLET AFTER 2 HOURS IF NEEDED, MAXIMUM 2 TABLETS IN 24 HOURS) Strength: 100 mg Patient not taking: Reported on 07/07/2024 04/07/23   Cox, Abigail, MD  Vitamin D , Ergocalciferol , (DRISDOL ) 1.25 MG (50000 UNIT) CAPS capsule TAKE 1 CAPSULE BY MOUTH EVERY 7 DAYS FOR 12 DOSES Patient taking differently: Take 50,000 Units by mouth every 7 (seven) days. Patient usually takes this medication on mondays 06/30/23   CoxAbigail, MD     I have spent 35 minutes evaluating patient, reviewing chart, and discussing plan of care with patient, family, pharmacist on  round and primary medical team.  Paula Southerly, MD [facility]

## 2024-07-12 DIAGNOSIS — I5023 Acute on chronic systolic (congestive) heart failure: Secondary | ICD-10-CM

## 2024-07-12 DIAGNOSIS — K920 Hematemesis: Secondary | ICD-10-CM | POA: Diagnosis not present

## 2024-07-12 DIAGNOSIS — K529 Noninfective gastroenteritis and colitis, unspecified: Secondary | ICD-10-CM | POA: Diagnosis not present

## 2024-07-12 DIAGNOSIS — R570 Cardiogenic shock: Secondary | ICD-10-CM | POA: Diagnosis not present

## 2024-07-12 DIAGNOSIS — R6521 Severe sepsis with septic shock: Secondary | ICD-10-CM

## 2024-07-12 DIAGNOSIS — K922 Gastrointestinal hemorrhage, unspecified: Secondary | ICD-10-CM

## 2024-07-12 DIAGNOSIS — A419 Sepsis, unspecified organism: Secondary | ICD-10-CM

## 2024-07-12 DIAGNOSIS — K7200 Acute and subacute hepatic failure without coma: Secondary | ICD-10-CM

## 2024-07-12 DIAGNOSIS — E43 Unspecified severe protein-calorie malnutrition: Secondary | ICD-10-CM

## 2024-07-12 DIAGNOSIS — Z7189 Other specified counseling: Secondary | ICD-10-CM | POA: Diagnosis not present

## 2024-07-12 DIAGNOSIS — Z66 Do not resuscitate: Secondary | ICD-10-CM

## 2024-07-12 DIAGNOSIS — Z515 Encounter for palliative care: Secondary | ICD-10-CM | POA: Diagnosis not present

## 2024-07-12 MED ORDER — GLYCOPYRROLATE 1 MG PO TABS: 1.0000 mg | ORAL_TABLET | ORAL | Status: DC | PRN

## 2024-07-12 MED ORDER — HYDROMORPHONE HCL 1 MG/ML IJ SOLN
2.0000 mg | Freq: Once | INTRAMUSCULAR | Status: AC
  Administered 2024-07-12: 2 mg via INTRAVENOUS

## 2024-07-12 MED ORDER — GLYCOPYRROLATE 0.2 MG/ML IJ SOLN: 0.2000 mg | INTRAMUSCULAR | Status: DC | PRN

## 2024-07-12 MED ORDER — HALOPERIDOL LACTATE 5 MG/ML IJ SOLN
2.0000 mg | Freq: Once | INTRAMUSCULAR | Status: AC
  Administered 2024-07-12: 2 mg via INTRAVENOUS
  Filled 2024-07-12 (×3): qty 1

## 2024-07-12 MED ORDER — SCOPOLAMINE 1 MG/3DAYS TD PT72
1.0000 | MEDICATED_PATCH | TRANSDERMAL | Status: DC
  Administered 2024-07-12 – 2024-07-21 (×4): 1 mg via TRANSDERMAL
  Filled 2024-07-12 (×4): qty 1

## 2024-07-12 MED ORDER — HYDROMORPHONE BOLUS VIA INFUSION
1.0000 mg | INTRAVENOUS | Status: DC | PRN
  Administered 2024-07-12 – 2024-07-13 (×19): 1 mg via INTRAVENOUS

## 2024-07-12 MED ORDER — MIDAZOLAM HCL (PF) 2 MG/2ML IJ SOLN
2.0000 mg | INTRAMUSCULAR | Status: DC | PRN
  Administered 2024-07-12 – 2024-07-18 (×14): 2 mg via INTRAVENOUS
  Filled 2024-07-12 (×14): qty 2

## 2024-07-12 MED ORDER — GLYCOPYRROLATE 0.2 MG/ML IJ SOLN
0.4000 mg | INTRAMUSCULAR | Status: DC
  Administered 2024-07-12 – 2024-07-22 (×60): 0.4 mg via INTRAVENOUS
  Filled 2024-07-12 (×61): qty 2

## 2024-07-12 MED ORDER — HYDROMORPHONE HCL-NACL 50-0.9 MG/50ML-% IV SOLN
6.0000 mg/h | INTRAVENOUS | Status: DC
  Administered 2024-07-12: 15 mg/h via INTRAVENOUS
  Administered 2024-07-12: 6 mg/h via INTRAVENOUS
  Administered 2024-07-12: 8 mg/h via INTRAVENOUS
  Administered 2024-07-12 – 2024-07-13 (×2): 15 mg/h via INTRAVENOUS
  Administered 2024-07-13: 17 mg/h via INTRAVENOUS
  Administered 2024-07-13 (×2): 19 mg/h via INTRAVENOUS
  Administered 2024-07-13: 17 mg/h via INTRAVENOUS
  Administered 2024-07-13: 15 mg/h via INTRAVENOUS
  Administered 2024-07-13 – 2024-07-14 (×7): 19 mg/h via INTRAVENOUS
  Filled 2024-07-12 (×20): qty 50

## 2024-07-12 NOTE — Progress Notes (Signed)
 Nutrition Brief Note  Chart reviewed. Pt now transitioned to comfort care.  No further nutrition interventions planned at this time.  Please re-consult as needed.   Shelle Iron RD, LDN Contact via [employer].

## 2024-07-12 NOTE — Progress Notes (Signed)
   07/12/24 1516  TOC Brief Assessment  Insurance and Status Reviewed  Patient has primary care physician Yes  Home environment has been reviewed Home with daughter  Prior level of function: Needs assistance  Prior/Current Home Services No current home services  Social Drivers of Health Review SDOH reviewed no interventions necessary  Readmission risk has been reviewed Yes  Transition of care needs no transition of care needs at this time   Pt screened. Pt will transition to comfort care measures. No IP CM needs identified. Will sign off.

## 2024-07-12 NOTE — Plan of Care (Signed)
   Problem: Coping: Goal: Level of anxiety will decrease Outcome: Progressing

## 2024-07-12 NOTE — Consult Note (Signed)
 Palliative Medicine Inpatient Consult Note  Consulting Provider: Dr. Kathrin  Reason for consult:   Palliative Care Consult Services Palliative Medicine Consult  Reason for Consult? End of life care   07/12/2024  HPI:  Per intake H&P --> [name] is 68 y.o. w with history of bladder cancer with bone mets, chronic pain on methadone , HFrEF NICM w AICD, COPD. Palliative care asked to get involved to support symptoms under comfort care.  Clinical Assessment/Goals of Care:  *Please note that this is a verbal dictation therefore any spelling or grammatical errors are due to the Dragon Medical One system interpretation.  I have reviewed medical records including EPIC notes, labs and imaging, received report from bedside RN, assessed the patient who is lying in bed somnolent nondistressed.    I met with patient's daughter, son, 2 grandchildren and a good friend to further discuss diagnosis prognosis, GOC, EOL wishes, disposition and options.   I introduced Palliative Medicine as specialized medical care for people living with serious illness. It focuses on providing relief from the symptoms and stress of a serious illness. The goal is to improve quality of life for both the patient and the family.  Medical History Review and Understanding:  A review of patient's past medical history significant for bladder cancer with bony metastasis, chronic pain, congestive heart failure, and COPD was completed.  Social History:  [name] is from [place]  originally.  She moved to [facility] [place]  after her divorce.  She is identified as someone who could do a little bit of everything.  She is a woman of Christian faith.  Functional and Nutritional State:  Has been living at home with her daughter helping her with has been able to complete some B ADLs however does have  IADLs.  Degree of debility.  Advance Directives:  A detailed discussion was had today regarding advanced directives.   Patient's children are her surrogate decision makers.  Code Status: Concepts specific to code status, artifical feeding and hydration, continued IV antibiotics and rehospitalization was had.  The difference between a aggressive medical intervention path  and a palliative comfort care path for this patient at this time was had.   Established DO NOT RESUSCITATE DO NOT INTUBATE CODE STATUS  Discussion:  We reviewed the difficulty of patient's previous clinical state though family note they had recently gotten the news that patient's cancer was in remission.  They shared the shock of her being admitted to the hospital and how acutely she decompensated from Thursday to Friday.  They shared the decision had been made to focus on comfort and alleviating her symptom burden.  We talked about transition to comfort measures in house and what that would entail inclusive of medications to control pain, dyspnea, agitation, nausea, itching, and hiccups. We discussed stopping all uneccessary measures such as cardiac monitoring, blood draws, needle sticks, and frequent vital signs. Utilized reflective listening throughout our time together.   Patient's family does not feel the fentanyl  drip is helping her symptoms and have requested consideration of a different medication.  We discussed changing the fentanyl  to Dilaudid .  We reviewed how patients family feel angry at her current health state and was able to sit in acknowledge their feelings in the setting of her clinical decline.  I shared that I would locate bereavement resources for them during this difficult time.   Discussed the importance of continued conversation with family and their  medical providers regarding overall plan of care and treatment options, ensuring  decisions are within the context of the patients values and GOCs.  Decision Maker: [name], [name] (Daughter): [phone] (Mobile)   SUMMARY OF RECOMMENDATIONS   DNAR/DNI  Comfort  Care  Will transition from fentanyl  drip to Dilaudid  drip with boluses  Continue Precedex  Additional comfort meds per [facility]  Unrestricted visitation  Appreciate involvement of chaplain support team  Will provide gone from my side as well as bereavement resources  The palliative care team will remain involved to support patient's grandmother during this difficult time  Code Status/Advance Care Planning: DNAR/DNI  Palliative Prophylaxis:  Aspiration, Bowel Regimen, Delirium Protocol, Frequent Pain Assessment, Oral Care, Palliative Wound Care, and Turn Reposition  Additional Recommendations (Limitations, Scope, Preferences): Comfort care  Psycho-social/Spiritual:  Desire for further Chaplaincy support: Yes Additional Recommendations: Education    Prognosis: Limited hours to days  Discharge Planning: Discharge will be [facility]  Vitals:   07/12/24 1000 07/12/24 1100  BP:    Pulse:  (!) 58  Resp: (!) 7 (!) 8  Temp:    SpO2:      Intake/Output Summary (Last 24 hours) at 07/12/2024 1122 Last data filed at 07/12/2024 1100 Gross per 24 hour  Intake 1396.75 ml  Output 100 ml  Net 1296.75 ml   Last Weight  Most recent update: 07/08/2024  5:19 PM    Weight  54.4 kg (120 lb)             LABS: CBC:    Component Value Date/Time   WBC 20.6 (H) 07/10/2024 0403   HGB 9.6 (L) 07/10/2024 0403   HGB 9.4 (L) 06/29/2024 1337   HGB 12.7 09/25/2023 1449   HCT 29.0 (L) 07/10/2024 0403   HCT 37.5 09/25/2023 1449   PLT 152 07/10/2024 0403   PLT 321 06/29/2024 1337   PLT 314 09/25/2023 1449   MCV 91.8 07/10/2024 0403   MCV 87 09/25/2023 1449   NEUTROABS 8.3 (H) 07/08/2024 1117   NEUTROABS 8.8 (H) 09/25/2023 1449   LYMPHSABS 1.0 07/08/2024 1117   LYMPHSABS 0.6 (L) 09/25/2023 1449   MONOABS 0.5 07/08/2024 1117   EOSABS 0.4 07/08/2024 1117   EOSABS 0.0 09/25/2023 1449   BASOSABS 0.2 (H) 07/08/2024 1117   BASOSABS 0.0 09/25/2023 1449   Comprehensive Metabolic  Panel:    Component Value Date/Time   NA 141 07/10/2024 1031   NA 139 09/25/2023 1449   K 4.9 07/10/2024 1031   CL 98 07/10/2024 1031   CO2 22 07/10/2024 1031   BUN 21 07/10/2024 1031   BUN 32 (H) 09/25/2023 1449   CREATININE 1.84 (H) 07/10/2024 1031   CREATININE 1.89 (H) 06/29/2024 1337   GLUCOSE 215 (H) 07/10/2024 1031   CALCIUM  9.7 07/10/2024 1031   AST 4,665 (H) 07/10/2024 1031   AST 10 (L) 06/29/2024 1337   ALT 1,552 (H) 07/10/2024 1031   ALT 5 06/29/2024 1337   ALKPHOS 75 07/10/2024 1031   BILITOT 0.5 07/10/2024 1031   BILITOT 0.4 06/29/2024 1337   PROT 4.5 (L) 07/10/2024 1031   PROT 6.5 09/25/2023 1449   ALBUMIN  2.5 (L) 07/10/2024 1031   ALBUMIN  4.3 09/25/2023 1449    Gen: Elderly Caucasian female chronically ill in appearance HEENT: Dry mucous membranes CV: Regular rate and rhythm PULM: On 2 L nasal cannula breathing is even and nonlabored ABD: soft/nontender EXT: Distal extremities are cool to the touch Neuro: Somnolent  PPS: 10%   This conversation/these recommendations were discussed with patient primary care team, Dr. Kathrin ______________________________________________________ Rosaline Becton [facility]  Palliative Medicine Team Team Cell Phone: [phone] Please utilize secure chat with additional questions, if there is no response within 30 minutes please call the above phone number  Total Time: 75 Billing based on MDM: High  Palliative Medicine Team providers are available by phone from 7am to 7pm daily and can be reached through the team cell phone.  Should this patient require assistance outside of these hours, please call the patient's attending physician.

## 2024-07-12 NOTE — Progress Notes (Signed)
 Per RN, PT still has arterial line. PT is total Comfort Care.

## 2024-07-12 NOTE — Progress Notes (Signed)
 On call chaplain paged to support pt's family for EOL process. Pt was not alert at the time of this visit, she had 6 family members at her bedside. RN Waddell and family reported pt had cried out earlier this morning, pt's medication was adjusted and pt was resting peacefully at the time of this visit. Pt is of the Christian faith and family is reassured knowing she will go to [place] when the time comes. Pt family shared she had overcome cancer, and received that good news about a week ago, then was diagnosed with septic infection which led to this hospitalization. Chaplain provided reflective listening, a compassionate presence, prayer and reassurance that family was doing everything possible to help her through this process. On call chaplain will refer to staff chaplain for additional support. If urgent need arises, please page Chaplain Team [phone].  Chaplain [place], [place]    07/12/24 0700  Spiritual Encounters  Type of Visit Initial  Care provided to: [facility] partners present during encounter Nurse  Referral source Nurse (RN/NT/LPN)  Reason for visit End-of-life  OnCall Visit Yes  Spiritual Framework  Presenting Themes Meaning/purpose/sources of inspiration;Impactful experiences and emotions  Values/beliefs Christian  Community/Connection Family;Friend(s)  Patient Stress Factors Health changes  Family Stress Factors Loss  Interventions  Spiritual Care Interventions Made Established relationship of care and support;Compassionate presence;Reflective listening;Narrative/life review;Bereavement/grief support  Intervention Outcomes  Outcomes Reduced anxiety;Connection to spiritual care

## 2024-07-12 NOTE — Progress Notes (Signed)
 PROGRESS NOTE  KEILANI TERRANCE FMW:[id number] DOB: 01/17/1956   PCP: Teressa Harrie HERO, FNP  Patient is from: Home.  DOA: 07/08/2024 LOS: 4  Chief complaints Chief Complaint  Patient presents with   Hematemesis   Diarrhea     Brief Narrative / Interim history: 68 y/o F with a PMH significant for bladder cancer w/ bone metastasis on palliative chemo, chronic pain on methadone , HFimpEF s/p AICD (recovered EF 55-60% on 05/2023), and COPD who presented to [facility] ED for nausea, vomiting and diarrhea for 1 day and admitted with working diagnosis of intractable nausea and vomiting, pancolitis, dehydration and electrolyte derangements.   In ED, stable vitals.  K2.5. Cr 1.16.  Bicarb 21.  AG 16.  Glucose 108.  Mg 1.1.  WBC 10.5.  Hgb 9.8.  UA with large LE and few bacteria.  Hemoccult negative.  CT abdomen and pelvis suggested pancolitis without bowel obstruction, mild right hydronephrosis and right pigtail ureteral stent with proximal tip in the proximal ureter.  Cultures obtained.  Patient received 1 L LR bolus and 1 L NS bolus.  Started on potassium and magnesium  supplementation as well as ceftriaxone  and Flagyl  and admitted.  The next day on 11/14, patient became obtunded with hematemesis and intubated and transferred to ICU with working diagnosis of profound mixed cardiogenic and septic shock.  Antibiotics broadened and she was started on vasopressors and steroids as well.  CMP revealed elevated liver enzymes, AKI and hypokalemia.  Lactic acid 8.0.  proBNP > 35,000.  Troponin 486.  WBC up to 20.6.  Hgb remained stable at 9.6.  VBG metabolic acidosis.  TTE with LVEF of <20%, RWMA, mildly reduced RV SF.   Patient had worsening liver enzymes concerning for shock liver.  Also worsening lactic acidosis, AKI and hypotension.  Eventually transition to full comfort care on 11/15, transferred to hospitalist service on 11/17 for end-of-life care.  Palliative medicine consulted for help with end-of-life  care.  Subjective: Seen and examined earlier this morning.  Had episode of agitation requiring addition of Versed  and increment in fentanyl  overnight.  Looks comfortable this morning.  Seen by palliative earlier.  Multiple family members at bedside.  Daughter had a lot of questions about her rapid decline that I have attempted to answer to the best of my knowledge.   Assessment and plan: End-of-life care/full comfort care-patient developed multiorgan failure in the setting of cardiogenic shock.  Comfort care initiated on 11/15 while in ICU. - Palliative medicine consulted for assistance with end-of-life care - Comfort medications per palliative care - Anticipate in hospital death  Acute on chronic HFrEF/cardiogenic shock: TTE with LVEF of < 20% and RWMA (new).  He had TTE in 10/2022 with LVEF of 55 to 60% and G1 DD.  proBNP > 35,000.  Troponin 486 >> 655.  Patient received 2 L fluid boluses the night of admission and was on maintenance.  Also question about septic shock but no clear source of infection.  Possible septic shock: No clear source of infection other than pancolitis.  Surprisingly, she had no fever or leukocytosis on admission.  Blood cultures NGTD.  Urine culture with multiple species.  Over 19, influenza, RSV and a 20 pathogen RVP nonreactive.  Intractable nausea and vomiting: CT suggested pancolitis.  Unclear if she had C. difficile or other bacterial infection.  Initially started on ceftriaxone  and Flagyl .  Acute respiratory failure with hypoxia: Became obtunded with respiratory distress the morning of 11/14 requiring intubation and ICU transfer.  Elevated  troponin: Likely demand ischemia in the setting of cardiogenic shock/heart failure.  Shock liver: Likely ischemic insult in the setting of cardiogenic shock.  Acute kidney injury on CKD-3A: b/l Cr ~1.1.  Severe lactic acidosis  Hypokalemia/hypomagnesemia  High anion gap metabolic acidosis  History of bladder cancer  with metastasis  Hematemesis Normocytic anemia: H&H stable.  DO NOT RESUSCITATE   Severe malnutrition Body mass index is 23.44 kg/m. Nutrition Problem: Severe Malnutrition Etiology: chronic illness (bladder cancer with bone mets) Signs/Symptoms: moderate fat depletion, severe muscle depletion, percent weight loss (21% in 9 months) Percent weight loss: 21 % (in 9 months) Interventions: Refer to RD note for recommendations   DVT prophylaxis:  SCDs Start: 07/08/24 1828  Code Status: DNR-comfort Family Communication: Multiple family members including daughter, son and grandchildren Level of care: Stepdown Status is: Inpatient Remains inpatient appropriate because: End-of-life care   Final disposition: Anticipate in-hospital death   55 minutes with more than 50% spent in reviewing records, counseling patient/family and coordinating care.  Consultants:  Critical care Palliative medicine  Procedures: None  Microbiology summarized: COVID-19, influenza and RSV PCR nonreactive 20 pathogen RVP nonreactive Blood cultures NGTD Urine cultures multiple species MRSA PCR screen positive  Objective: Vitals:   07/12/24 1000 07/12/24 1100 07/12/24 1200 07/12/24 1221  BP:      Pulse:  (!) 58 88 75  Resp: (!) 7 (!) 8 (!) 6 18  Temp:      TempSrc:      SpO2:      Weight:      Height:        Examination:  GENERAL: No apparent distress.  Somnolent. RESP:  No IWOB.   SKIN: no apparent skin lesion or wound NEURO: Somnolent. PSYCH: Calm.  No distress or agitation.  Sch Meds:  Scheduled Meds:  arformoterol  15 mcg Nebulization BID   budesonide (PULMICORT) nebulizer solution  0.5 mg Nebulization BID   mouth rinse  15 mL Mouth Rinse 4 times per day   Continuous Infusions:  dexmedetomidine (PRECEDEX) IV infusion 1.2 mcg/kg/hr (07/12/24 1142)   HYDROmorphone  6 mg/hr (07/12/24 1142)   PRN Meds:.acetaminophen  **OR** acetaminophen , artificial tears, glycopyrrolate  **OR**  glycopyrrolate  **OR** glycopyrrolate , HYDROmorphone , ipratropium-albuterol , midazolam  PF  Antimicrobials: Anti-infectives (From admission, onward)    Start     Dose/Rate Route Frequency Ordered Stop   07/09/24 1400  vancomycin  (VANCOREADY) IVPB 1250 mg/250 mL  Status:  Discontinued        1,250 mg 166.7 mL/hr over 90 Minutes Intravenous Every 48 hours 07/09/24 1239 07/10/24 2120   07/09/24 1100  piperacillin -tazobactam (ZOSYN ) IVPB 3.375 g  Status:  Discontinued        3.375 g 12.5 mL/hr over 240 Minutes Intravenous Every 8 hours 07/09/24 1012 07/10/24 2120   07/09/24 0815  azithromycin  (ZITHROMAX ) 500 mg in sodium chloride  0.9 % 250 mL IVPB  Status:  Discontinued        500 mg 250 mL/hr over 60 Minutes Intravenous Every 24 hours 07/09/24 0804 07/10/24 2120   07/08/24 2000  metroNIDAZOLE  (FLAGYL ) IVPB 500 mg  Status:  Discontinued        500 mg 100 mL/hr over 60 Minutes Intravenous Every 12 hours 07/08/24 1904 07/09/24 1225   07/08/24 2000  cefTRIAXone  (ROCEPHIN ) 2 g in sodium chloride  0.9 % 100 mL IVPB  Status:  Discontinued        2 g 200 mL/hr over 30 Minutes Intravenous Every 24 hours 07/08/24 1904 07/09/24 0956   07/08/24 1500  cefTRIAXone  (  ROCEPHIN ) 1 g in sodium chloride  0.9 % 100 mL IVPB  Status:  Discontinued        1 g 200 mL/hr over 30 Minutes Intravenous  Once 07/08/24 1455 07/08/24 1456        I have personally reviewed the following labs and images: CBC: Recent Labs  Lab 07/08/24 1117 07/09/24 0030 07/09/24 0846 07/09/24 1323 07/09/24 1431 07/09/24 1438 07/09/24 1640 07/10/24 0403  WBC 10.5  --  14.1*  --   --   --  15.3* 20.6*  NEUTROABS 8.3*  --   --   --   --   --   --   --   HGB 9.8*   < > 11.9*  --  8.2* 11.9* 10.3* 9.6*  HCT 29.8*   < > 36.3  --  24.0* 35.0* 33.6* 29.0*  MCV 90.6  --  92.8  --   --   --  96.8 91.8  PLT 240  --  PLATELET CLUMPS NOTED ON SMEAR, COUNT APPEARS DECREASED 155  --   --  147* 152   < > = values in this interval not  displayed.   BMP &GFR Recent Labs  Lab 07/08/24 1117 07/09/24 0030 07/09/24 0846 07/09/24 1431 07/09/24 1438 07/09/24 1440 07/09/24 1640 07/10/24 0403 07/10/24 1031  NA 143 140 143 144 145  --  145 141 141  K 2.5* 3.5 4.1 3.5 3.5  --  3.9 3.2* 4.9  CL 107 108 109  --  106  --  102 97* 98  CO2 21* 16* 8*  --   --   --  14* 22 22  GLUCOSE 108* 124* 148*  --  237*  --  260* 254* 215*  BUN 16 12 14   --  13  --  15 17 21   CREATININE 1.16* 1.00 1.25*  --  1.30*  --  1.48* 1.69* 1.84*  CALCIUM  8.8* 8.9 8.8*  --   --   --  9.2 8.4* 9.7  MG 1.1* 2.3 2.3  --   --  3.0*  --   --   --   PHOS  --   --  5.0*  --   --   --   --   --   --    Estimated Creatinine Clearance: 20.4 mL/min (A) (by C-G formula based on SCr of 1.84 mg/dL (H)). Liver & Pancreas: Recent Labs  Lab 07/08/24 1117 07/09/24 0846 07/09/24 1640 07/10/24 0403 07/10/24 1031  AST 15 183* 566* 2,176* 4,665*  ALT <5 64* 228* 909* 1,552*  ALKPHOS 53 86 100 92 75  BILITOT 0.3 0.6 0.9 0.5 0.5  PROT 6.1* 6.6 5.5* 5.4* 4.5*  ALBUMIN  3.2* 3.6 2.9* 3.0* 2.5*   Recent Labs  Lab 07/08/24 1117  LIPASE 15   No results for input(s): AMMONIA in the last 168 hours. Diabetic: Recent Labs    07/09/24 1640  HGBA1C 5.3   Recent Labs  Lab 07/09/24 2206 07/10/24 0020 07/10/24 0410 07/10/24 0758 07/10/24 1122  GLUCAP 267* 281* 244* 226* 204*   Cardiac Enzymes: No results for input(s): CKTOTAL, CKMB, CKMBINDEX, TROPONINI in the last 168 hours. Recent Labs    07/09/24 1323 07/10/24 0403  PROBNP >35,000.0* >35,000.0*   Coagulation Profile: Recent Labs  Lab 07/08/24 1117 07/09/24 1323  INR 1.1 1.6*   Thyroid  Function Tests: No results for input(s): TSH, T4TOTAL, FREET4, T3FREE, THYROIDAB in the last 72 hours. Lipid Profile: No results for input(s): CHOL, HDL, LDLCALC,  TRIG, CHOLHDL, LDLDIRECT in the last 72 hours. Anemia Panel: No results for input(s): VITAMINB12, FOLATE,  FERRITIN, TIBC, IRON, RETICCTPCT in the last 72 hours. Urine analysis:    Component Value Date/Time   COLORURINE YELLOW 07/08/2024 1113   APPEARANCEUR CLOUDY (A) 07/08/2024 1113   LABSPEC 1.012 07/08/2024 1113   PHURINE 8.0 07/08/2024 1113   GLUCOSEU NEGATIVE 07/08/2024 1113   HGBUR SMALL (A) 07/08/2024 1113   BILIRUBINUR NEGATIVE 07/08/2024 1113   BILIRUBINUR negative 08/15/2021 1537   KETONESUR NEGATIVE 07/08/2024 1113   PROTEINUR 100 (A) 07/08/2024 1113   UROBILINOGEN 0.2 08/15/2021 1537   UROBILINOGEN 1.0 03/26/2015 1441   NITRITE NEGATIVE 07/08/2024 1113   LEUKOCYTESUR LARGE (A) 07/08/2024 1113   Sepsis Labs: Invalid input(s): PROCALCITONIN, LACTICIDVEN  Microbiology: Recent Results (from the past 240 hours)  Urine Culture     Status: Abnormal   Collection Time: 07/08/24 11:13 AM   Specimen: Urine, Clean Catch  Result Value Ref Range Status   Specimen Description   Final    URINE, CLEAN CATCH Performed at [facility], [address] [place], [place] [address]    Special Requests   Final    NONE Performed at [facility], [address] [place], [place] [address]    Culture MULTIPLE SPECIES PRESENT, SUGGEST RECOLLECTION (A)  Final   Report Status 07/09/2024 FINAL  Final  Blood culture (routine x 2)     Status: None (Preliminary result)   Collection Time: 07/08/24  4:25 PM   Specimen: BLOOD  Result Value Ref Range Status   Specimen Description   Final    BLOOD PORTA CATH Performed at [facility], [address] [place], [place] [address]    Special Requests   Final    BOTTLES DRAWN AEROBIC AND ANAEROBIC Blood Culture adequate volume Performed at [facility], [address], [place], [place] [address]    Culture   Final    NO GROWTH 3 DAYS Performed at [facility], [address] [place], [place] [address]    Report Status PENDING  Incomplete  Respiratory (~20  pathogens) panel by PCR     Status: None   Collection Time: 07/09/24  8:12 AM   Specimen: Nasopharyngeal Swab; Respiratory  Result Value Ref Range Status   Adenovirus NOT DETECTED NOT DETECTED Final   Coronavirus 229E NOT DETECTED NOT DETECTED Final    Comment: (NOTE) The Coronavirus on the Respiratory Panel, DOES NOT test for the novel  Coronavirus (2019 nCoV)    Coronavirus HKU1 NOT DETECTED NOT DETECTED Final   Coronavirus NL63 NOT DETECTED NOT DETECTED Final   Coronavirus OC43 NOT DETECTED NOT DETECTED Final   Metapneumovirus NOT DETECTED NOT DETECTED Final   Rhinovirus / Enterovirus NOT DETECTED NOT DETECTED Final   Influenza A NOT DETECTED NOT DETECTED Final   Influenza B NOT DETECTED NOT DETECTED Final   Parainfluenza Virus 1 NOT DETECTED NOT DETECTED Final   Parainfluenza Virus 2 NOT DETECTED NOT DETECTED Final   Parainfluenza Virus 3 NOT DETECTED NOT DETECTED Final   Parainfluenza Virus 4 NOT DETECTED NOT DETECTED Final   Respiratory Syncytial Virus NOT DETECTED NOT DETECTED Final   Bordetella pertussis NOT DETECTED NOT DETECTED Final   Bordetella Parapertussis NOT DETECTED NOT DETECTED Final   Chlamydophila pneumoniae NOT DETECTED NOT DETECTED Final   Mycoplasma pneumoniae NOT DETECTED NOT DETECTED Final    Comment: Performed at [facility], [address]  [place], [place] [address]  Blood culture (routine x 2)     Status: None (Preliminary result)   Collection Time: 07/09/24  8:46 AM   Specimen: BLOOD  Result Value Ref Range Status   Specimen Description   Final    BLOOD SITE NOT SPECIFIED Performed at [facility], [address], [place], [place] [address]    Special Requests   Final    BOTTLES DRAWN AEROBIC ONLY Blood Culture results may not be optimal due to an inadequate volume of blood received in culture bottles Performed at [facility], [address] [place], [place] [address]    Culture   Final    NO GROWTH 3  DAYS Performed at [facility], [address] [place], [place] [address]    Report Status PENDING  Incomplete  MRSA Next Gen by PCR, Nasal     Status: Abnormal   Collection Time: 07/09/24  9:28 AM   Specimen: Nasopharyngeal Swab; Nasal Swab  Result Value Ref Range Status   MRSA by PCR Next Gen DETECTED (A) NOT DETECTED Final    Comment: RESULT CALLED TO, READ BACK BY AND VERIFIED WITH:  GRIFFIN,C 07/09/2024 1143 AJ (NOTE) The GeneXpert MRSA Assay (FDA approved for NASAL specimens only), is one component of a comprehensive MRSA colonization surveillance program. It is not intended to diagnose MRSA infection nor to guide or monitor treatment for MRSA infections. Test performance is not FDA approved in patients less than 75 years old. Performed at [facility], [address], [place], [place] [address]   SARS Coronavirus 2 by RT PCR (hospital order, performed in [facility] hospital lab) *cepheid single result test* Anterior Nasal Swab     Status: None   Collection Time: 07/09/24  4:40 PM   Specimen: Anterior Nasal Swab  Result Value Ref Range Status   SARS Coronavirus 2 by RT PCR NEGATIVE NEGATIVE Final    Comment: (NOTE) SARS-CoV-2 target nucleic acids are NOT DETECTED.  The SARS-CoV-2 RNA is generally detectable in upper and lower respiratory specimens during the acute phase of infection. The lowest concentration of SARS-CoV-2 viral copies this assay can detect is 250 copies / mL. A negative result does not preclude SARS-CoV-2 infection and should not be used as the sole basis for treatment or other patient management decisions.  A negative result may occur with improper specimen collection / handling, submission of specimen other than nasopharyngeal swab, presence of viral mutation(s) within the areas targeted by this assay, and inadequate number of viral copies (<250 copies / mL). A negative result must be combined with clinical observations,  patient history, and epidemiological information.  Fact Sheet for Patients:   roadlaptop.co.za  Fact Sheet for Healthcare Providers: http://kim-miller.com/  This test is not yet approved or  cleared by the [place]  FDA and has been authorized for detection and/or diagnosis of SARS-CoV-2 by FDA under an Emergency Use Authorization (EUA).  This EUA will remain in effect (meaning this test can be used) for the duration of the COVID-19 declaration under Section 564(b)(1) of the Act, 21 U.S.C. section 360bbb-3(b)(1), unless the authorization is terminated or revoked sooner.  Performed at [facility], [address] [place], [place] [address]     Radiology Studies: No results found.    Tavone Caesar T. Damico Partin [facility]  If 7PM-7AM, please contact night-coverage [email or web address] 07/12/2024, 12:26 PM

## 2024-07-12 NOTE — Progress Notes (Signed)
 Blondie NP is at the bedside discussing end of life care with family and addressing their concerns and what to expect when it comes to Palliative Care . RN emphasizes that staff is here to provide comfort, support and pain relief for pt . Pt's family verbalizes understanding and are praying over pt at this time. SABRA SABRA Oas Nurse Yahweh aware .

## 2024-07-12 NOTE — Progress Notes (Signed)
   07/12/24 1620  Spiritual Encounters  Type of Visit Follow up  Care provided to: Family   I offered spiritual care support to Mrs Pounds niece and partner. I had not met them earlier, so I welcomed this family and shared my availability as well as how to reach a chaplain through the care team if needed later.  Lillyana Majette L. [name].Div

## 2024-07-12 NOTE — Plan of Care (Signed)
  Problem: Activity: Goal: Ability to tolerate increased activity will improve Outcome: Not Progressing   Problem: Role Relationship: Goal: Method of communication will improve Outcome: Not Progressing   Problem: Education: Goal: Knowledge of General Education information will improve Description: Including pain rating scale, medication(s)/side effects and non-pharmacologic comfort measures Outcome: Not Progressing   Problem: Health Behavior/Discharge Planning: Goal: Ability to manage health-related needs will improve Outcome: Not Progressing   Problem: Clinical Measurements: Goal: Diagnostic test results will improve Outcome: Not Progressing   Problem: Nutrition: Goal: Adequate nutrition will be maintained Outcome: Not Progressing

## 2024-07-12 NOTE — Progress Notes (Signed)
 eLink Physician-Brief Progress Note Patient Name: [name] DOB: September 27, 1955 MRN: [id number]   Date of Service  07/12/2024  HPI/Events of Note  Patient was transitioned to comfort measures only on 07/10/24.  On maximal fentanyl  at 400 mcg and Precedex at 1.2 and still agitated  eICU Interventions  Add as needed Versed  to maximize comfort     Intervention Category Minor Interventions: Routine modifications to care plan (e.g. PRN medications for pain, fever);Agitation / anxiety - evaluation and management  Janalee Grobe 07/12/2024, 5:51 AM

## 2024-07-12 NOTE — Progress Notes (Signed)
   07/12/24 1115  Spiritual Encounters  Type of Visit Initial  Care provided to: [facility] partners present during encounter Other (comment) (palliative)  Referral source Chaplain team  Reason for visit End-of-life  OnCall Visit No  Interventions  Spiritual Care Interventions Made Established relationship of care and support;Compassionate presence   I offered support following my on-call chaplain colleague this morning for the family of Mrs [name] gathered at bedside.  I introduced myself to family and asked about their well-being. I affirmed their presence together and that is could be sensed and felt by [name]. I also met palliative team member , Rosaline Becton, NP and let her know I was present for grief support.  I let family know I would be checking in on them throughout the day.  Daivion Pape L. [name].Div

## 2024-07-13 DIAGNOSIS — R570 Cardiogenic shock: Secondary | ICD-10-CM | POA: Diagnosis not present

## 2024-07-13 DIAGNOSIS — I5023 Acute on chronic systolic (congestive) heart failure: Secondary | ICD-10-CM | POA: Diagnosis not present

## 2024-07-13 DIAGNOSIS — Z7189 Other specified counseling: Secondary | ICD-10-CM | POA: Diagnosis not present

## 2024-07-13 DIAGNOSIS — K529 Noninfective gastroenteritis and colitis, unspecified: Secondary | ICD-10-CM | POA: Diagnosis not present

## 2024-07-13 DIAGNOSIS — K920 Hematemesis: Secondary | ICD-10-CM | POA: Diagnosis not present

## 2024-07-13 DIAGNOSIS — Z515 Encounter for palliative care: Secondary | ICD-10-CM | POA: Diagnosis not present

## 2024-07-13 MED ORDER — HYDROMORPHONE BOLUS VIA INFUSION
2.0000 mg | INTRAVENOUS | Status: DC | PRN
  Administered 2024-07-13 – 2024-07-16 (×16): 2 mg via INTRAVENOUS

## 2024-07-13 MED ORDER — LORAZEPAM 2 MG/ML IJ SOLN
2.0000 mg | INTRAMUSCULAR | Status: DC
  Administered 2024-07-13 – 2024-07-17 (×25): 2 mg via INTRAVENOUS
  Filled 2024-07-13 (×25): qty 1

## 2024-07-13 MED ORDER — LORAZEPAM 2 MG/ML IJ SOLN
2.0000 mg | Freq: Four times a day (QID) | INTRAMUSCULAR | Status: DC
  Administered 2024-07-13: 2 mg via INTRAVENOUS
  Filled 2024-07-13: qty 1

## 2024-07-13 NOTE — Plan of Care (Signed)
  Problem: Activity: Goal: Ability to tolerate increased activity will improve Outcome: Progressing   Problem: Respiratory: Goal: Ability to maintain a clear airway and adequate ventilation will improve Outcome: Progressing   Problem: Role Relationship: Goal: Method of communication will improve Outcome: Progressing   Problem: Education: Goal: Knowledge of General Education information will improve Description: Including pain rating scale, medication(s)/side effects and non-pharmacologic comfort measures Outcome: Progressing   Problem: Health Behavior/Discharge Planning: Goal: Ability to manage health-related needs will improve Outcome: Progressing   Problem: Clinical Measurements: Goal: Ability to maintain clinical measurements within normal limits will improve Outcome: Progressing Goal: Will remain free from infection Outcome: Progressing Goal: Diagnostic test results will improve Outcome: Progressing Goal: Respiratory complications will improve Outcome: Progressing Goal: Cardiovascular complication will be avoided Outcome: Progressing   Problem: Activity: Goal: Risk for activity intolerance will decrease Outcome: Progressing   Problem: Nutrition: Goal: Adequate nutrition will be maintained Outcome: Progressing   Problem: Coping: Goal: Level of anxiety will decrease Outcome: Progressing   Problem: Elimination: Goal: Will not experience complications related to bowel motility Outcome: Progressing Goal: Will not experience complications related to urinary retention Outcome: Progressing   Problem: Pain Managment: Goal: General experience of comfort will improve and/or be controlled Outcome: Progressing   Problem: Safety: Goal: Ability to remain free from injury will improve Outcome: Progressing   Problem: Skin Integrity: Goal: Risk for impaired skin integrity will decrease Outcome: Progressing   Problem: Education: Goal: Ability to describe self-care  measures that may prevent or decrease complications (Diabetes Survival Skills Education) will improve Outcome: Progressing Goal: Individualized Educational Video(s) Outcome: Progressing   Problem: Coping: Goal: Ability to adjust to condition or change in health will improve Outcome: Progressing   Problem: Fluid Volume: Goal: Ability to maintain a balanced intake and output will improve Outcome: Progressing   Problem: Health Behavior/Discharge Planning: Goal: Ability to identify and utilize available resources and services will improve Outcome: Progressing Goal: Ability to manage health-related needs will improve Outcome: Progressing   Problem: Metabolic: Goal: Ability to maintain appropriate glucose levels will improve Outcome: Progressing   Problem: Nutritional: Goal: Maintenance of adequate nutrition will improve Outcome: Progressing Goal: Progress toward achieving an optimal weight will improve Outcome: Progressing   Problem: Skin Integrity: Goal: Risk for impaired skin integrity will decrease Outcome: Progressing   Problem: Tissue Perfusion: Goal: Adequacy of tissue perfusion will improve Outcome: Progressing

## 2024-07-13 NOTE — Progress Notes (Signed)
 PROGRESS NOTE  [name] FMW:[id number] DOB: 05-03-1956   PCP: Teressa Harrie HERO, FNP  Patient is from: Home.  DOA: 07/08/2024 LOS: 5  Chief complaints Chief Complaint  Patient presents with   Hematemesis   Diarrhea     Brief Narrative / Interim history: 68 y/o F with a PMH significant for bladder cancer w/ bone metastasis on palliative chemo, chronic pain on methadone , HFimpEF s/p AICD (recovered EF 55-60% on 05/2023), and COPD who presented to [facility] ED for nausea, vomiting and diarrhea for 1 day and admitted with working diagnosis of intractable nausea and vomiting, pancolitis, dehydration and electrolyte derangements.   In ED, stable vitals.  K2.5. Cr 1.16.  Bicarb 21.  AG 16.  Glucose 108.  Mg 1.1.  WBC 10.5.  Hgb 9.8.  UA with large LE and few bacteria.  Hemoccult negative.  CT abdomen and pelvis suggested pancolitis without bowel obstruction, mild right hydronephrosis and right pigtail ureteral stent with proximal tip in the proximal ureter.  Cultures obtained.  Patient received 1 L LR bolus and 1 L NS bolus.  Started on potassium and magnesium  supplementation as well as ceftriaxone  and Flagyl  and admitted.  The next day on 11/14, patient became obtunded with hematemesis and intubated and transferred to ICU with working diagnosis of profound mixed cardiogenic and septic shock.  Antibiotics broadened and she was started on vasopressors and steroids as well.  CMP revealed elevated liver enzymes, AKI and hypokalemia.  Lactic acid 8.0.  proBNP > 35,000.  Troponin 486.  WBC up to 20.6.  Hgb remained stable at 9.6.  VBG metabolic acidosis.  TTE with LVEF of <20%, RWMA, mildly reduced RV SF.   Patient had worsening liver enzymes concerning for shock liver.  Also worsening lactic acidosis, AKI and hypotension.  Eventually transition to full comfort care on 11/15, transferred to hospitalist service on 11/17 for end-of-life care.  Palliative medicine consulted for help with end-of-life care.  She  is on Dilaudid  infusion.  Subjective: Seen and examined earlier this morning and this afternoon.  Multiple family members at bedside.  On Dilaudid  infusion.  Breathing shallow, slow and rattly.  No apparent distress.   Assessment and plan: End-of-life care/full comfort care-patient developed multiorgan failure in the setting of cardiogenic shock.  Comfort care initiated on 11/15 while in ICU. - Palliative medicine consulted for assistance with end-of-life care - Comfort medications per palliative care-on Dilaudid  infusion with bolus. - Anticipate in hospital death in hours to days  Acute on chronic HFrEF/cardiogenic shock: TTE with LVEF of < 20% and RWMA (new).  He had TTE in 10/2022 with LVEF of 55 to 60% and G1 DD.  proBNP > 35,000.  Troponin 486 >> 655.  Patient received 2 L fluid boluses the night of admission and was on maintenance.  Also question about septic shock but no clear source of infection.  Possible septic shock: No clear source of infection other than pancolitis.  Surprisingly, she had no fever or leukocytosis on admission.  Blood cultures NGTD.  Urine culture with multiple species.  Over 19, influenza, RSV and a 20 pathogen RVP nonreactive.  Intractable nausea and vomiting: CT suggested pancolitis.  Unclear if she had C. difficile or other bacterial infection.  Initially started on ceftriaxone  and Flagyl .  Acute respiratory failure with hypoxia: Became obtunded with respiratory distress the morning of 11/14 requiring intubation and ICU transfer.  Elevated troponin: Likely demand ischemia in the setting of cardiogenic shock/heart failure.  Shock liver: Likely ischemic  insult in the setting of cardiogenic shock.  Acute kidney injury on CKD-3A: b/l Cr ~1.1.  Severe lactic acidosis  Hypokalemia/hypomagnesemia  High anion gap metabolic acidosis  History of bladder cancer with metastasis  Hematemesis Normocytic anemia: H&H stable.  DO NOT RESUSCITATE   Severe  malnutrition Body mass index is 23.44 kg/m. Nutrition Problem: Severe Malnutrition Etiology: chronic illness (bladder cancer with bone mets) Signs/Symptoms: moderate fat depletion, severe muscle depletion, percent weight loss (21% in 9 months) Percent weight loss: 21 % (in 9 months) Interventions: Refer to RD note for recommendations   DVT prophylaxis:  SCDs Start: 07/08/24 1828  Code Status: DNR-comfort Family Communication: Multiple family members including daughter, son and grandchildren Level of care: Med-Surg Status is: Inpatient Remains inpatient appropriate because: End-of-life care   Final disposition: Anticipate in-hospital death   55 minutes with more than 50% spent in reviewing records, counseling patient/family and coordinating care.  Consultants:  Critical care Palliative medicine  Procedures: None  Microbiology summarized: COVID-19, influenza and RSV PCR nonreactive 20 pathogen RVP nonreactive Blood cultures NGTD Urine cultures multiple species MRSA PCR screen positive  Objective: Vitals:   07/12/24 1600 07/12/24 1700 07/12/24 1706 07/13/24 0604  BP:      Pulse: 87 90 90   Resp: (!) 9 13 (!) 9 10  Temp:      TempSrc:      SpO2:      Weight:      Height:        Examination:  GENERAL: No apparent distress.  Somnolent. RESP:  No IWOB.  Breathing shallow, slow and rattling SKIN: no apparent skin lesion or wound NEURO: Somnolent. PSYCH: No distress or agitation.  Sch Meds:  Scheduled Meds:  glycopyrrolate   0.4 mg Intravenous Q4H   LORazepam   2 mg Intravenous Q4H   mouth rinse  15 mL Mouth Rinse 4 times per day   scopolamine  1 patch Transdermal Q72H   Continuous Infusions:  HYDROmorphone  19 mg/hr (07/13/24 1405)   PRN Meds:.acetaminophen  **OR** acetaminophen , artificial tears, HYDROmorphone , midazolam  PF  Antimicrobials: Anti-infectives (From admission, onward)    Start     Dose/Rate Route Frequency Ordered Stop   07/09/24 1400   vancomycin  (VANCOREADY) IVPB 1250 mg/250 mL  Status:  Discontinued        1,250 mg 166.7 mL/hr over 90 Minutes Intravenous Every 48 hours 07/09/24 1239 07/10/24 2120   07/09/24 1100  piperacillin -tazobactam (ZOSYN ) IVPB 3.375 g  Status:  Discontinued        3.375 g 12.5 mL/hr over 240 Minutes Intravenous Every 8 hours 07/09/24 1012 07/10/24 2120   07/09/24 0815  azithromycin  (ZITHROMAX ) 500 mg in sodium chloride  0.9 % 250 mL IVPB  Status:  Discontinued        500 mg 250 mL/hr over 60 Minutes Intravenous Every 24 hours 07/09/24 0804 07/10/24 2120   07/08/24 2000  metroNIDAZOLE  (FLAGYL ) IVPB 500 mg  Status:  Discontinued        500 mg 100 mL/hr over 60 Minutes Intravenous Every 12 hours 07/08/24 1904 07/09/24 1225   07/08/24 2000  cefTRIAXone  (ROCEPHIN ) 2 g in sodium chloride  0.9 % 100 mL IVPB  Status:  Discontinued        2 g 200 mL/hr over 30 Minutes Intravenous Every 24 hours 07/08/24 1904 07/09/24 0956   07/08/24 1500  cefTRIAXone  (ROCEPHIN ) 1 g in sodium chloride  0.9 % 100 mL IVPB  Status:  Discontinued        1 g 200 mL/hr over  30 Minutes Intravenous  Once 07/08/24 1455 07/08/24 1456        I have personally reviewed the following labs and images: CBC: Recent Labs  Lab 07/08/24 1117 07/09/24 0030 07/09/24 0846 07/09/24 1323 07/09/24 1431 07/09/24 1438 07/09/24 1640 07/10/24 0403  WBC 10.5  --  14.1*  --   --   --  15.3* 20.6*  NEUTROABS 8.3*  --   --   --   --   --   --   --   HGB 9.8*   < > 11.9*  --  8.2* 11.9* 10.3* 9.6*  HCT 29.8*   < > 36.3  --  24.0* 35.0* 33.6* 29.0*  MCV 90.6  --  92.8  --   --   --  96.8 91.8  PLT 240  --  PLATELET CLUMPS NOTED ON SMEAR, COUNT APPEARS DECREASED 155  --   --  147* 152   < > = values in this interval not displayed.   BMP &GFR Recent Labs  Lab 07/08/24 1117 07/09/24 0030 07/09/24 0846 07/09/24 1431 07/09/24 1438 07/09/24 1440 07/09/24 1640 07/10/24 0403 07/10/24 1031  NA 143 140 143 144 145  --  145 141 141  K 2.5*  3.5 4.1 3.5 3.5  --  3.9 3.2* 4.9  CL 107 108 109  --  106  --  102 97* 98  CO2 21* 16* 8*  --   --   --  14* 22 22  GLUCOSE 108* 124* 148*  --  237*  --  260* 254* 215*  BUN 16 12 14   --  13  --  15 17 21   CREATININE 1.16* 1.00 1.25*  --  1.30*  --  1.48* 1.69* 1.84*  CALCIUM  8.8* 8.9 8.8*  --   --   --  9.2 8.4* 9.7  MG 1.1* 2.3 2.3  --   --  3.0*  --   --   --   PHOS  --   --  5.0*  --   --   --   --   --   --    Estimated Creatinine Clearance: 20.4 mL/min (A) (by C-G formula based on SCr of 1.84 mg/dL (H)). Liver & Pancreas: Recent Labs  Lab 07/08/24 1117 07/09/24 0846 07/09/24 1640 07/10/24 0403 07/10/24 1031  AST 15 183* 566* 2,176* 4,665*  ALT <5 64* 228* 909* 1,552*  ALKPHOS 53 86 100 92 75  BILITOT 0.3 0.6 0.9 0.5 0.5  PROT 6.1* 6.6 5.5* 5.4* 4.5*  ALBUMIN  3.2* 3.6 2.9* 3.0* 2.5*   Recent Labs  Lab 07/08/24 1117  LIPASE 15   No results for input(s): AMMONIA in the last 168 hours. Diabetic: No results for input(s): HGBA1C in the last 72 hours.  Recent Labs  Lab 07/09/24 2206 07/10/24 0020 07/10/24 0410 07/10/24 0758 07/10/24 1122  GLUCAP 267* 281* 244* 226* 204*   Cardiac Enzymes: No results for input(s): CKTOTAL, CKMB, CKMBINDEX, TROPONINI in the last 168 hours. Recent Labs    07/09/24 1323 07/10/24 0403  PROBNP >35,000.0* >35,000.0*   Coagulation Profile: Recent Labs  Lab 07/08/24 1117 07/09/24 1323  INR 1.1 1.6*   Thyroid  Function Tests: No results for input(s): TSH, T4TOTAL, FREET4, T3FREE, THYROIDAB in the last 72 hours. Lipid Profile: No results for input(s): CHOL, HDL, LDLCALC, TRIG, CHOLHDL, LDLDIRECT in the last 72 hours. Anemia Panel: No results for input(s): VITAMINB12, FOLATE, FERRITIN, TIBC, IRON, RETICCTPCT in the last 72 hours. Urine analysis:  Component Value Date/Time   COLORURINE YELLOW 07/08/2024 1113   APPEARANCEUR CLOUDY (A) 07/08/2024 1113   LABSPEC 1.012 07/08/2024 1113    PHURINE 8.0 07/08/2024 1113   GLUCOSEU NEGATIVE 07/08/2024 1113   HGBUR SMALL (A) 07/08/2024 1113   BILIRUBINUR NEGATIVE 07/08/2024 1113   BILIRUBINUR negative 08/15/2021 1537   KETONESUR NEGATIVE 07/08/2024 1113   PROTEINUR 100 (A) 07/08/2024 1113   UROBILINOGEN 0.2 08/15/2021 1537   UROBILINOGEN 1.0 03/26/2015 1441   NITRITE NEGATIVE 07/08/2024 1113   LEUKOCYTESUR LARGE (A) 07/08/2024 1113   Sepsis Labs: Invalid input(s): PROCALCITONIN, LACTICIDVEN  Microbiology: Recent Results (from the past 240 hours)  Urine Culture     Status: Abnormal   Collection Time: 07/08/24 11:13 AM   Specimen: Urine, Clean Catch  Result Value Ref Range Status   Specimen Description   Final    URINE, CLEAN CATCH Performed at [facility], [address], [place], [place] [address]    Special Requests   Final    NONE Performed at [facility], [address], [place], [place] [address]    Culture MULTIPLE SPECIES PRESENT, SUGGEST RECOLLECTION (A)  Final   Report Status 07/09/2024 FINAL  Final  Blood culture (routine x 2)     Status: None (Preliminary result)   Collection Time: 07/08/24  4:25 PM   Specimen: BLOOD  Result Value Ref Range Status   Specimen Description   Final    BLOOD PORTA CATH Performed at [facility], [address], [place], [place] [address]    Special Requests   Final    BOTTLES DRAWN AEROBIC AND ANAEROBIC Blood Culture adequate volume Performed at [facility], [address] [place], [place] [address]    Culture   Final    NO GROWTH 4 DAYS Performed at [facility], [address] [place], [place] [address]    Report Status PENDING  Incomplete  Respiratory (~20 pathogens) panel by PCR     Status: None   Collection Time: 07/09/24  8:12 AM   Specimen: Nasopharyngeal Swab; Respiratory  Result Value Ref Range Status   Adenovirus NOT DETECTED NOT DETECTED Final   Coronavirus 229E  NOT DETECTED NOT DETECTED Final    Comment: (NOTE) The Coronavirus on the Respiratory Panel, DOES NOT test for the novel  Coronavirus (2019 nCoV)    Coronavirus HKU1 NOT DETECTED NOT DETECTED Final   Coronavirus NL63 NOT DETECTED NOT DETECTED Final   Coronavirus OC43 NOT DETECTED NOT DETECTED Final   Metapneumovirus NOT DETECTED NOT DETECTED Final   Rhinovirus / Enterovirus NOT DETECTED NOT DETECTED Final   Influenza A NOT DETECTED NOT DETECTED Final   Influenza B NOT DETECTED NOT DETECTED Final   Parainfluenza Virus 1 NOT DETECTED NOT DETECTED Final   Parainfluenza Virus 2 NOT DETECTED NOT DETECTED Final   Parainfluenza Virus 3 NOT DETECTED NOT DETECTED Final   Parainfluenza Virus 4 NOT DETECTED NOT DETECTED Final   Respiratory Syncytial Virus NOT DETECTED NOT DETECTED Final   Bordetella pertussis NOT DETECTED NOT DETECTED Final   Bordetella Parapertussis NOT DETECTED NOT DETECTED Final   Chlamydophila pneumoniae NOT DETECTED NOT DETECTED Final   Mycoplasma pneumoniae NOT DETECTED NOT DETECTED Final    Comment: Performed at [facility], [address], [place], [place] [address]  Blood culture (routine x 2)     Status: None (Preliminary result)   Collection Time: 07/09/24  8:46 AM   Specimen: BLOOD  Result Value Ref Range Status   Specimen Description   Final    BLOOD SITE NOT SPECIFIED Performed at [facility], [address], [place], [place] [address]    Special Requests   Final    BOTTLES DRAWN AEROBIC ONLY Blood Culture results may not be optimal due to an inadequate volume of blood received in culture bottles Performed at [facility], [address] [place], [place] [address]    Culture   Final    NO GROWTH 4 DAYS Performed at [facility], [address], [place], [place] [address]    Report Status PENDING  Incomplete  MRSA Next Gen by PCR, Nasal     Status: Abnormal   Collection Time: 07/09/24  9:28 AM   Specimen:  Nasopharyngeal Swab; Nasal Swab  Result Value Ref Range Status   MRSA by PCR Next Gen DETECTED (A) NOT DETECTED Final    Comment: RESULT CALLED TO, READ BACK BY AND VERIFIED WITH:  GRIFFIN,C 07/09/2024 1143 AJ (NOTE) The GeneXpert MRSA Assay (FDA approved for NASAL specimens only), is one component of a comprehensive MRSA colonization surveillance program. It is not intended to diagnose MRSA infection nor to guide or monitor treatment for MRSA infections. Test performance is not FDA approved in patients less than 58 years old. Performed at [facility], [address], [place], [place] [address]   SARS Coronavirus 2 by RT PCR (hospital order, performed in [facility] hospital lab) *cepheid single result test* Anterior Nasal Swab     Status: None   Collection Time: 07/09/24  4:40 PM   Specimen: Anterior Nasal Swab  Result Value Ref Range Status   SARS Coronavirus 2 by RT PCR NEGATIVE NEGATIVE Final    Comment: (NOTE) SARS-CoV-2 target nucleic acids are NOT DETECTED.  The SARS-CoV-2 RNA is generally detectable in upper and lower respiratory specimens during the acute phase of infection. The lowest concentration of SARS-CoV-2 viral copies this assay can detect is 250 copies / mL. A negative result does not preclude SARS-CoV-2 infection and should not be used as the sole basis for treatment or other patient management decisions.  A negative result may occur with improper specimen collection / handling, submission of specimen other than nasopharyngeal swab, presence of viral mutation(s) within the areas targeted by this assay, and inadequate number of viral copies (<250 copies / mL). A negative result must be combined with clinical observations, patient history, and epidemiological information.  Fact Sheet for Patients:   roadlaptop.co.za  Fact Sheet for Healthcare Providers: http://kim-miller.com/  This test is not yet  approved or  cleared by the [place]  FDA and has been authorized for detection and/or diagnosis of SARS-CoV-2 by FDA under an Emergency Use Authorization (EUA).  This EUA will remain in effect (meaning this test can be used) for the duration of the COVID-19 declaration under Section 564(b)(1) of the Act, 21 U.S.C. section 360bbb-3(b)(1), unless the authorization is terminated or revoked sooner.  Performed at [facility], [address], [place], [place] [address]     Radiology Studies: No results found.    Carmisha Larusso T. Britian Jentz [facility]  If 7PM-7AM, please contact night-coverage [email or web address] 07/13/2024, 2:16 PM

## 2024-07-13 NOTE — Progress Notes (Signed)
   07/13/24 1511  Spiritual Encounters  Type of Visit Initial  Conversation partners present during encounter Nurse  Reason for visit Urgent spiritual support  OnCall Visit No   Patient/family requesting [facility] [place] for Last Rites and Anointing of the Sick. Per request contact was made with [facility] [employer]. Father Quintin will respond to the needs of the patient and family.

## 2024-07-13 NOTE — Plan of Care (Signed)
 Problem: Activity: Goal: Ability to tolerate increased activity will improve 07/13/2024 1908 by Claudene Rolin LABOR, RN Outcome: Progressing 07/13/2024 1907 by Claudene Rolin LABOR, RN Outcome: Progressing   Problem: Respiratory: Goal: Ability to maintain a clear airway and adequate ventilation will improve 07/13/2024 1908 by Claudene Rolin LABOR, RN Outcome: Progressing 07/13/2024 1907 by Claudene Rolin LABOR, RN Outcome: Progressing   Problem: Role Relationship: Goal: Method of communication will improve 07/13/2024 1908 by Claudene Rolin LABOR, RN Outcome: Progressing 07/13/2024 1907 by Claudene Rolin LABOR, RN Outcome: Progressing   Problem: Education: Goal: Knowledge of General Education information will improve Description: Including pain rating scale, medication(s)/side effects and non-pharmacologic comfort measures 07/13/2024 1908 by Claudene Rolin LABOR, RN Outcome: Progressing 07/13/2024 1907 by Claudene Rolin LABOR, RN Outcome: Progressing   Problem: Health Behavior/Discharge Planning: Goal: Ability to manage health-related needs will improve 07/13/2024 1908 by Claudene Rolin LABOR, RN Outcome: Progressing 07/13/2024 1907 by Claudene Rolin LABOR, RN Outcome: Progressing   Problem: Clinical Measurements: Goal: Ability to maintain clinical measurements within normal limits will improve 07/13/2024 1908 by Claudene Rolin LABOR, RN Outcome: Progressing 07/13/2024 1907 by Claudene Rolin LABOR, RN Outcome: Progressing Goal: Will remain free from infection 07/13/2024 1908 by Claudene Rolin LABOR, RN Outcome: Progressing 07/13/2024 1907 by Claudene Rolin LABOR, RN Outcome: Progressing Goal: Diagnostic test results will improve 07/13/2024 1908 by Claudene Rolin LABOR, RN Outcome: Progressing 07/13/2024 1907 by Claudene Rolin LABOR, RN Outcome: Progressing Goal: Respiratory complications will improve 07/13/2024 1908 by Claudene Rolin LABOR, RN Outcome: Progressing 07/13/2024 1907 by Claudene Rolin LABOR, RN Outcome:  Progressing Goal: Cardiovascular complication will be avoided 07/13/2024 1908 by Claudene Rolin LABOR, RN Outcome: Progressing 07/13/2024 1907 by Claudene Rolin LABOR, RN Outcome: Progressing   Problem: Activity: Goal: Risk for activity intolerance will decrease 07/13/2024 1908 by Claudene Rolin LABOR, RN Outcome: Progressing 07/13/2024 1907 by Claudene Rolin LABOR, RN Outcome: Progressing   Problem: Nutrition: Goal: Adequate nutrition will be maintained 07/13/2024 1908 by Claudene Rolin LABOR, RN Outcome: Progressing 07/13/2024 1907 by Claudene Rolin LABOR, RN Outcome: Progressing   Problem: Coping: Goal: Level of anxiety will decrease 07/13/2024 1908 by Claudene Rolin LABOR, RN Outcome: Progressing 07/13/2024 1907 by Claudene Rolin LABOR, RN Outcome: Progressing   Problem: Elimination: Goal: Will not experience complications related to bowel motility 07/13/2024 1908 by Claudene Rolin LABOR, RN Outcome: Progressing 07/13/2024 1907 by Claudene Rolin LABOR, RN Outcome: Progressing Goal: Will not experience complications related to urinary retention 07/13/2024 1908 by Claudene Rolin LABOR, RN Outcome: Progressing 07/13/2024 1907 by Claudene Rolin LABOR, RN Outcome: Progressing   Problem: Pain Managment: Goal: General experience of comfort will improve and/or be controlled 07/13/2024 1908 by Claudene Rolin LABOR, RN Outcome: Progressing 07/13/2024 1907 by Claudene Rolin LABOR, RN Outcome: Progressing   Problem: Safety: Goal: Ability to remain free from injury will improve 07/13/2024 1908 by Claudene Rolin LABOR, RN Outcome: Progressing 07/13/2024 1907 by Claudene Rolin LABOR, RN Outcome: Progressing   Problem: Skin Integrity: Goal: Risk for impaired skin integrity will decrease 07/13/2024 1908 by Claudene Rolin LABOR, RN Outcome: Progressing 07/13/2024 1907 by Claudene Rolin LABOR, RN Outcome: Progressing   Problem: Education: Goal: Ability to describe self-care measures that may prevent or decrease complications (Diabetes  Survival Skills Education) will improve 07/13/2024 1908 by Claudene Rolin LABOR, RN Outcome: Progressing 07/13/2024 1907 by Claudene Rolin LABOR, RN Outcome: Progressing Goal: Individualized Educational Video(s) 07/13/2024 1908 by Claudene Rolin LABOR, RN Outcome: Progressing 07/13/2024 1907 by Claudene Rolin LABOR, RN Outcome: Progressing   Problem: Coping: Goal:  Ability to adjust to condition or change in health will improve 07/13/2024 1908 by Claudene Rolin LABOR, RN Outcome: Progressing 07/13/2024 1907 by Claudene Rolin LABOR, RN Outcome: Progressing   Problem: Fluid Volume: Goal: Ability to maintain a balanced intake and output will improve 07/13/2024 1908 by Claudene Rolin LABOR, RN Outcome: Progressing 07/13/2024 1907 by Claudene Rolin LABOR, RN Outcome: Progressing   Problem: Health Behavior/Discharge Planning: Goal: Ability to identify and utilize available resources and services will improve 07/13/2024 1908 by Claudene Rolin LABOR, RN Outcome: Progressing 07/13/2024 1907 by Claudene Rolin LABOR, RN Outcome: Progressing Goal: Ability to manage health-related needs will improve 07/13/2024 1908 by Claudene Rolin LABOR, RN Outcome: Progressing 07/13/2024 1907 by Claudene Rolin LABOR, RN Outcome: Progressing   Problem: Metabolic: Goal: Ability to maintain appropriate glucose levels will improve 07/13/2024 1908 by Claudene Rolin LABOR, RN Outcome: Progressing 07/13/2024 1907 by Claudene Rolin LABOR, RN Outcome: Progressing   Problem: Nutritional: Goal: Maintenance of adequate nutrition will improve 07/13/2024 1908 by Claudene Rolin LABOR, RN Outcome: Progressing 07/13/2024 1907 by Claudene Rolin LABOR, RN Outcome: Progressing Goal: Progress toward achieving an optimal weight will improve 07/13/2024 1908 by Claudene Rolin LABOR, RN Outcome: Progressing 07/13/2024 1907 by Claudene Rolin LABOR, RN Outcome: Progressing   Problem: Skin Integrity: Goal: Risk for impaired skin integrity will decrease 07/13/2024 1908 by Claudene Rolin LABOR, RN Outcome: Progressing 07/13/2024 1907 by Claudene Rolin LABOR, RN Outcome: Progressing   Problem: Tissue Perfusion: Goal: Adequacy of tissue perfusion will improve 07/13/2024 1908 by Claudene Rolin LABOR, RN Outcome: Progressing 07/13/2024 1907 by Claudene Rolin LABOR, RN Outcome: Progressing

## 2024-07-13 NOTE — Progress Notes (Addendum)
 Palliative Medicine Inpatient Follow Up Note HPI: [name] is 68 y.o. w with history of bladder cancer with bone mets, chronic pain on methadone , HFrEF NICM w AICD, COPD. Palliative care asked to get involved to support symptoms under comfort care.   Today's Discussion 07/13/2024  *Please note that this is a verbal dictation therefore any spelling or grammatical errors are due to the Dragon Medical One system interpretation.  Chart reviewed inclusive of vital signs, progress notes, laboratory results, and diagnostic images.   I met with [name] at bedside. She has increased apnea episodes with duration(s) of 7-8 seconds. She has cool distal extremities.  I met with patients daughter, [name]. We reviewed the patients symptoms management needs. [name] shared that the transition from the ICU setting to the floor was very stressful and it took quite a bit of time overnight to get [name] to a place of comfort. We discussed the increased ceiling rate of patients dilaudid  gtt and starting some around the clock ativan . We discussed physiological symptoms to anticipate at end of life.   Created space and opportunity for patients daughter to explore thoughts feelings and fears regarding her mothers current medical situation.  Reviewed the strength of [name] under the present circumstances and what a beautiful and supportive family she has.  Reviewed patients religious values, she was raised Catholic and there may be interested in have a [employer] perform the [employer]. [name] wanted to speak to her family more about this. I shared I would check back in with her later in the day. __________________________________ Addendum:  Came back to bedside this later afternoon. I met with patients daughter, son, nephew, and grandson. We reviewed the above.  We discussed the dissatisfaction associated with patients present state and how the family feels that there have not been clear answers.  They do feel more satisfied with what they have been informed of most recently - that patient went into cardiogenic shock leading to her acute decompensation.  We discussed the type of woman that [name] is and how strong she has been throughout her life. I shared acknowledgement of how difficult her current condition is for her family.  Patients family and I discussed how she was raised as a Air Traffic Controller and they do feel she would want her last rights. I shared that I would reach out to the chaplain team to further support this.   Questions and concerns addressed/Palliative Support Provided.   Add time: 36  Objective Assessment: Vital Signs Vitals:   07/12/24 1706 07/13/24 0604  BP:    Pulse: 90   Resp: (!) 9 10  Temp:    SpO2:      Intake/Output Summary (Last 24 hours) at 07/13/2024 0941 Last data filed at 07/12/2024 1821 Gross per 24 hour  Intake 353.68 ml  Output --  Net 353.68 ml   Last Weight  Most recent update: 07/08/2024  5:19 PM    Weight  54.4 kg (120 lb)            Gen: Elderly Caucasian female chronically ill in appearance HEENT: Dry mucous membranes CV: Regular rate and rhythm PULM: (+) rattle, On 2L nasal cannula, (+) long apnea episodes ABD: soft/nontender EXT: Distal extremities are cool to the touch Neuro: Somnolent  SUMMARY OF RECOMMENDATIONS   DNAR/DNI   Comfort Care   Continue Dilaudid  drip with boluses - increase ceiling and bolus rate  Ativan  2mg  IVP Q2H ATC  Glycopyrrolate  0.4mg  IVP Q4H ATC  Scopolamine patch  Additional comfort meds per [facility]   Unrestricted visitation   Appreciate involvement of chaplain support team   Will provide gone from my side as well as bereavement resources   The palliative care team will remain involved to support [name] and her family ______________________________________________________________________________________ Rosaline Becton [facility] Palliative Medicine Team Team Cell Phone:  [phone] Please utilize secure chat with additional questions, if there is no response within 30 minutes please call the above phone number  Time Spent: 65 Billing based on MDM: High  Palliative Medicine Team providers are available by phone from 7am to 7pm daily and can be reached through the team cell phone.  Should this patient require assistance outside of these hours, please call the patient's attending physician.

## 2024-07-13 NOTE — Significant Event (Signed)
 Provided end of life care and education to patient's family. Alan LITTIE Portugal, RN

## 2024-07-14 ENCOUNTER — Other Ambulatory Visit (HOSPITAL_COMMUNITY): Payer: Self-pay

## 2024-07-14 DIAGNOSIS — R570 Cardiogenic shock: Secondary | ICD-10-CM | POA: Diagnosis not present

## 2024-07-14 DIAGNOSIS — I5023 Acute on chronic systolic (congestive) heart failure: Secondary | ICD-10-CM | POA: Diagnosis not present

## 2024-07-14 DIAGNOSIS — K920 Hematemesis: Secondary | ICD-10-CM | POA: Diagnosis not present

## 2024-07-14 DIAGNOSIS — Z515 Encounter for palliative care: Secondary | ICD-10-CM | POA: Diagnosis not present

## 2024-07-14 DIAGNOSIS — K529 Noninfective gastroenteritis and colitis, unspecified: Secondary | ICD-10-CM | POA: Diagnosis not present

## 2024-07-14 DIAGNOSIS — Z7189 Other specified counseling: Secondary | ICD-10-CM | POA: Diagnosis not present

## 2024-07-14 LAB — CULTURE, BLOOD (ROUTINE X 2)
Culture: NO GROWTH
Culture: NO GROWTH
Special Requests: ADEQUATE

## 2024-07-14 MED ORDER — SODIUM CHLORIDE 0.9 % IV SOLN
6.0000 mg/h | INTRAVENOUS | Status: DC
  Administered 2024-07-14 – 2024-07-15 (×4): 19 mg/h via INTRAVENOUS
  Administered 2024-07-15 (×3): 21 mg/h via INTRAVENOUS
  Administered 2024-07-15 (×2): 19 mg/h via INTRAVENOUS
  Administered 2024-07-15 (×2): 21 mg/h via INTRAVENOUS
  Administered 2024-07-16 (×3): 23 mg/h via INTRAVENOUS
  Administered 2024-07-16: 21 mg/h via INTRAVENOUS
  Administered 2024-07-16: 23 mg/h via INTRAVENOUS
  Filled 2024-07-14 (×28): qty 5

## 2024-07-14 MED ORDER — KETOROLAC TROMETHAMINE 15 MG/ML IJ SOLN
15.0000 mg | Freq: Four times a day (QID) | INTRAMUSCULAR | Status: AC
Start: 2024-07-14 — End: 2024-07-19
  Administered 2024-07-14 – 2024-07-19 (×20): 15 mg via INTRAVENOUS
  Filled 2024-07-14 (×20): qty 1

## 2024-07-14 NOTE — Progress Notes (Addendum)
 Palliative Medicine Inpatient Follow Up Note HPI: [name] is 68 y.o. w with history of bladder cancer with bone mets, chronic pain on methadone , HFrEF NICM w AICD, COPD. Palliative care asked to get involved to support symptoms under comfort care.   Today's Discussion 07/14/2024  *Please note that this is a verbal dictation therefore any spelling or grammatical errors are due to the Dragon Medical One system interpretation.  Chart reviewed inclusive of vital signs, progress notes, laboratory results, and diagnostic images.   I spoke with [name]'s RN, Deidre this morning. [name] has been resting comfortably and symptoms have been controlled from her perspective.   I met with [name]'s grandson at bedside this morning. He shares that [name] is comfortable - she is noted to be warmer this morning though does not appear febrile.  Her breathing is quite shallow this morning.   Per patients grandson, [name] did receive her last rights.   Plan to continue maintenance of comfort measures.  ________________________________ Addendum:  RNDeidre requests that we come and speak to patients family about Tusing condition as they feel hope with her stable vital signs.   I met with [name]'s family at bedside this afternoon in the presence of Dr. Kathrin. The patients daughter shares that she and her brothers don't understand why patients VS are stable. They worry she may be trying to awaken and we are sedating her. They worry that perhaps she is fighting and we are inhibiting her from doing this.   Dr. Gonfa explained patients current clinical condition(s) and the underlying organ failure that she has is not reversible. We were honest in sharing that Alaniz time is going to be short and the best thing that can be done is to spending time with her. Irregardless at this juncture of what medical interventions are taken her end result is going to be the same. Trying to reverse her clinical condition now would  cause great harm and suffering. Patients family share understanding.  During my time at bedside, [name] did moan and appear to have a harder time breathing. I was able to bolus her with dilaudid  and the gtt rate was then increases.   I was able to spend a good amount of time at bedside speaking with [name]'s family and listening to their concerns. We reviewed the plan(s) to continue the current care path.  I shared when patients son arrives I'm happy to speak with him as he is having a tough time coming to grips with [name] condition.   Questions and concerns addressed/Palliative Support Provided.   Add time: 84 ________________________________________________ Addendum:  I met with [name]'s daughter and grandson this late afternoon. We reviewed patients current status and how she appears comfortable and at peace.  I shared that I will not be present tomorrow though I will request that my colleague, Tobey Barnacle follow up.  Add Time: 20  Objective Assessment: Vital Signs Vitals:   07/13/24 0604 07/13/24 2115  BP:  101/67  Pulse:  (!) 119  Resp: 10 16  Temp:  98.6 F (37 C)  SpO2:  99%    Intake/Output Summary (Last 24 hours) at 07/14/2024 1118 Last data filed at 07/14/2024 9287 Gross per 24 hour  Intake --  Output 600 ml  Net -600 ml   Last Weight  Most recent update: 07/08/2024  5:19 PM    Weight  54.4 kg (120 lb)            Gen: Elderly Caucasian female chronically ill  in appearance HEENT: Dry mucous membranes CV: Regular rate and rhythm PULM: (+) rattle, On 2L nasal cannula, (+) long apnea episodes ABD: soft/nontender EXT: Distal extremities are cool to the touch Neuro: Somnolent  SUMMARY OF RECOMMENDATIONS   DNAR/DNI   Comfort Care   Continue Dilaudid  drip with boluses    Ativan  2mg  IVP Q2H ATC  Glycopyrrolate  0.4mg  IVP Q4H ATC  Scopolamine  patch  Tordol 15mg  IVP Q6H x5 doses   Additional comfort meds per [facility]   Unrestricted visitation    Appreciate involvement of chaplain support team   Will provide gone from my side as well as bereavement resources   The palliative care team will remain involved to support [name] and her family - Tobey Butler will take over case tomorrow ______________________________________________________________________________________ Rosaline Becton [facility] Palliative Medicine Team Team Cell Phone: [phone] Please utilize secure chat with additional questions, if there is no response within 30 minutes please call the above phone number  Billing based on MDM: High - review and modification of parenterally controlled substances  Palliative Medicine Team providers are available by phone from 7am to 7pm daily and can be reached through the team cell phone.  Should this patient require assistance outside of these hours, please call the patient's attending physician.

## 2024-07-14 NOTE — Progress Notes (Signed)
 I followed up with Kenzley's daughter to see if the priest had been able to come, as they had requested.  He came yesterday and did an anointing for Candi. They were very appreciative.  The family does not have any additional needs currently, but I let them know of our ongoing availability.  Please page as needs arise.  [phone].

## 2024-07-14 NOTE — Plan of Care (Signed)
 Problem: Activity: Goal: Ability to tolerate increased activity will improve 07/14/2024 0441 by Clifton Elsie PARAS, RN Outcome: Progressing 07/14/2024 0431 by Clifton Elsie PARAS, RN Outcome: Progressing   Problem: Respiratory: Goal: Ability to maintain a clear airway and adequate ventilation will improve 07/14/2024 0441 by Clifton Elsie PARAS, RN Outcome: Progressing 07/14/2024 0431 by Clifton Elsie PARAS, RN Outcome: Progressing   Problem: Role Relationship: Goal: Method of communication will improve 07/14/2024 0441 by Clifton Elsie PARAS, RN Outcome: Progressing 07/14/2024 0431 by Clifton Elsie PARAS, RN Outcome: Progressing   Problem: Education: Goal: Knowledge of General Education information will improve Description: Including pain rating scale, medication(s)/side effects and non-pharmacologic comfort measures 07/14/2024 0441 by Clifton Elsie PARAS, RN Outcome: Progressing 07/14/2024 0431 by Clifton Elsie PARAS, RN Outcome: Progressing   Problem: Health Behavior/Discharge Planning: Goal: Ability to manage health-related needs will improve 07/14/2024 0441 by Clifton Elsie PARAS, RN Outcome: Progressing 07/14/2024 0431 by Clifton Elsie PARAS, RN Outcome: Progressing   Problem: Clinical Measurements: Goal: Ability to maintain clinical measurements within normal limits will improve 07/14/2024 0441 by Clifton Elsie PARAS, RN Outcome: Progressing 07/14/2024 0431 by Clifton Elsie PARAS, RN Outcome: Progressing Goal: Will remain free from infection 07/14/2024 0441 by Clifton Elsie PARAS, RN Outcome: Progressing 07/14/2024 0431 by Clifton Elsie PARAS, RN Outcome: Progressing Goal: Diagnostic test results will improve 07/14/2024 0441 by Clifton Elsie PARAS, RN Outcome: Progressing 07/14/2024 0431 by Clifton Elsie PARAS, RN Outcome: Progressing Goal: Respiratory complications will improve 07/14/2024 0441 by Clifton Elsie PARAS, RN Outcome: Progressing 07/14/2024  0431 by Clifton Elsie PARAS, RN Outcome: Progressing Goal: Cardiovascular complication will be avoided 07/14/2024 0441 by Clifton Elsie PARAS, RN Outcome: Progressing 07/14/2024 0431 by Clifton Elsie PARAS, RN Outcome: Progressing   Problem: Activity: Goal: Risk for activity intolerance will decrease 07/14/2024 0441 by Clifton Elsie PARAS, RN Outcome: Progressing 07/14/2024 0431 by Clifton Elsie PARAS, RN Outcome: Progressing   Problem: Nutrition: Goal: Adequate nutrition will be maintained 07/14/2024 0441 by Clifton Elsie PARAS, RN Outcome: Progressing 07/14/2024 0431 by Clifton Elsie PARAS, RN Outcome: Progressing   Problem: Coping: Goal: Level of anxiety will decrease 07/14/2024 0441 by Clifton Elsie PARAS, RN Outcome: Progressing 07/14/2024 0431 by Clifton Elsie PARAS, RN Outcome: Progressing   Problem: Elimination: Goal: Will not experience complications related to bowel motility 07/14/2024 0441 by Clifton Elsie PARAS, RN Outcome: Progressing 07/14/2024 0431 by Clifton Elsie PARAS, RN Outcome: Progressing Goal: Will not experience complications related to urinary retention 07/14/2024 0441 by Clifton Elsie PARAS, RN Outcome: Progressing 07/14/2024 0431 by Clifton Elsie PARAS, RN Outcome: Progressing   Problem: Pain Managment: Goal: General experience of comfort will improve and/or be controlled 07/14/2024 0441 by Clifton Elsie PARAS, RN Outcome: Progressing 07/14/2024 0431 by Clifton Elsie PARAS, RN Outcome: Progressing   Problem: Safety: Goal: Ability to remain free from injury will improve 07/14/2024 0441 by Clifton Elsie PARAS, RN Outcome: Progressing 07/14/2024 0431 by Clifton Elsie PARAS, RN Outcome: Progressing   Problem: Skin Integrity: Goal: Risk for impaired skin integrity will decrease 07/14/2024 0441 by Clifton Elsie PARAS, RN Outcome: Progressing 07/14/2024 0431 by Clifton Elsie PARAS, RN Outcome: Progressing   Problem: Education: Goal:  Ability to describe self-care measures that may prevent or decrease complications (Diabetes Survival Skills Education) will improve 07/14/2024 0441 by Clifton Elsie PARAS, RN Outcome: Progressing 07/14/2024 0431 by Clifton Elsie PARAS, RN Outcome: Progressing Goal: Individualized Educational Video(s) 07/14/2024 0441 by Clifton Elsie PARAS, RN Outcome: Progressing 07/14/2024 0431 by Clifton Elsie PARAS, RN Outcome: Progressing   Problem: Coping: Goal:  Ability to adjust to condition or change in health will improve 07/14/2024 0441 by Clifton Elsie PARAS, RN Outcome: Progressing 07/14/2024 0431 by Clifton Elsie PARAS, RN Outcome: Progressing   Problem: Fluid Volume: Goal: Ability to maintain a balanced intake and output will improve 07/14/2024 0441 by Clifton Elsie PARAS, RN Outcome: Progressing 07/14/2024 0431 by Clifton Elsie PARAS, RN Outcome: Progressing   Problem: Health Behavior/Discharge Planning: Goal: Ability to identify and utilize available resources and services will improve 07/14/2024 0441 by Clifton Elsie PARAS, RN Outcome: Progressing 07/14/2024 0431 by Clifton Elsie PARAS, RN Outcome: Progressing Goal: Ability to manage health-related needs will improve 07/14/2024 0441 by Clifton Elsie PARAS, RN Outcome: Progressing 07/14/2024 0431 by Clifton Elsie PARAS, RN Outcome: Progressing   Problem: Metabolic: Goal: Ability to maintain appropriate glucose levels will improve 07/14/2024 0441 by Clifton Elsie PARAS, RN Outcome: Progressing 07/14/2024 0431 by Clifton Elsie PARAS, RN Outcome: Progressing   Problem: Nutritional: Goal: Maintenance of adequate nutrition will improve 07/14/2024 0441 by Clifton Elsie PARAS, RN Outcome: Progressing 07/14/2024 0431 by Clifton Elsie PARAS, RN Outcome: Progressing Goal: Progress toward achieving an optimal weight will improve 07/14/2024 0441 by Clifton Elsie PARAS, RN Outcome: Progressing 07/14/2024 0431 by Clifton Elsie PARAS, RN Outcome: Progressing   Problem: Skin Integrity: Goal: Risk for impaired skin integrity will decrease 07/14/2024 0441 by Clifton Elsie PARAS, RN Outcome: Progressing 07/14/2024 0431 by Clifton Elsie PARAS, RN Outcome: Progressing   Problem: Tissue Perfusion: Goal: Adequacy of tissue perfusion will improve 07/14/2024 0441 by Clifton Elsie PARAS, RN Outcome: Progressing 07/14/2024 0431 by Clifton Elsie PARAS, RN Outcome: Progressing

## 2024-07-14 NOTE — Progress Notes (Signed)
 PROGRESS NOTE  [name] FMW:[id number] DOB: 04-12-1956   PCP: Teressa Harrie HERO, FNP  Patient is from: Home.  DOA: 07/08/2024 LOS: 6  Chief complaints Chief Complaint  Patient presents with   Hematemesis   Diarrhea     Brief Narrative / Interim history: 68 y/o F with a PMH significant for bladder cancer w/ bone metastasis on palliative chemo, chronic pain on methadone , HFimpEF s/p AICD (recovered EF 55-60% on 05/2023), and COPD who presented to [facility] ED for nausea, vomiting and diarrhea for 1 day and admitted with working diagnosis of intractable nausea and vomiting, pancolitis, dehydration and electrolyte derangements.   In ED, stable vitals.  K2.5. Cr 1.16.  Bicarb 21.  AG 16.  Glucose 108.  Mg 1.1.  WBC 10.5.  Hgb 9.8.  UA with large LE and few bacteria.  Hemoccult negative.  CT abdomen and pelvis suggested pancolitis without bowel obstruction, mild right hydronephrosis and right pigtail ureteral stent with proximal tip in the proximal ureter.  Cultures obtained.  Patient received 1 L LR bolus and 1 L NS bolus.  Started on potassium and magnesium  supplementation as well as ceftriaxone  and Flagyl  and admitted.  The next day on 11/14, patient became obtunded with hematemesis and intubated and transferred to ICU with working diagnosis of profound mixed cardiogenic and septic shock.  Antibiotics broadened and she was started on vasopressors and steroids as well.  CMP revealed elevated liver enzymes, AKI and hypokalemia.  Lactic acid 8.0.  proBNP > 35,000.  Troponin 486.  WBC up to 20.6.  Hgb remained stable at 9.6.  VBG metabolic acidosis.  TTE with LVEF of <20%, RWMA, mildly reduced RV SF.   Patient had worsening liver enzymes concerning for shock liver.  Also worsening lactic acidosis, AKI and hypotension.  Eventually transition to full comfort care on 11/15, transferred to hospitalist service on 11/17 for end-of-life care.  Palliative medicine consulted for help with end-of-life care.  She  is on Dilaudid  infusion.  Subjective: Seen and examined earlier this morning and this afternoon.  Extensive discussion with multiple family members at bedside. Palliative care at bedside as well. On Dilaudid  infusion with boluses.  Breathing shallow, rattled with apneic episodes.  No apparent distress.   Assessment and plan: End-of-life care/full comfort care-patient developed multiorgan failure in the setting of cardiogenic shock.  Comfort care initiated on 11/15 while in ICU. - Palliative medicine consulted for assistance with end-of-life care - Comfort medications per palliative care-on Dilaudid  infusion with bolus. - Anticipate in hospital death in hours to days  Acute on chronic HFrEF/cardiogenic shock: TTE with LVEF of < 20% and RWMA (new).  He had TTE in 10/2022 with LVEF of 55 to 60% and G1 DD.  proBNP > 35,000.  Troponin 486 >> 655.  Patient received 2 L fluid boluses the night of admission and was on maintenance.  Also question about septic shock but no clear source of infection.  Possible septic shock: No clear source of infection other than pancolitis.  Surprisingly, she had no fever or leukocytosis on admission.  Blood cultures NGTD.  Urine culture with multiple species.  Over 19, influenza, RSV and a 20 pathogen RVP nonreactive.  Intractable nausea and vomiting: CT suggested pancolitis.  Unclear if she had C. difficile or other bacterial infection.  Initially started on ceftriaxone  and Flagyl .  Acute respiratory failure with hypoxia: Became obtunded with respiratory distress the morning of 11/14 requiring intubation and ICU transfer.  Elevated troponin: Likely demand ischemia in  the setting of cardiogenic shock/heart failure.  Shock liver: Likely ischemic insult in the setting of cardiogenic shock.  Acute kidney injury on CKD-3A: b/l Cr ~1.1.  Severe lactic acidosis  Hypokalemia/hypomagnesemia  High anion gap metabolic acidosis  History of bladder cancer with  metastasis  Hematemesis Normocytic anemia: H&H stable.  DO NOT RESUSCITATE   Severe malnutrition Body mass index is 23.44 kg/m. Nutrition Problem: Severe Malnutrition Etiology: chronic illness (bladder cancer with bone mets) Signs/Symptoms: moderate fat depletion, severe muscle depletion, percent weight loss (21% in 9 months) Percent weight loss: 21 % (in 9 months) Interventions: Refer to RD note for recommendations   DVT prophylaxis:  SCDs Start: 07/08/24 1828  Code Status: DNR-comfort Family Communication: Multiple family members including daughter, son and grandchildren Level of care: Med-Surg Status is: Inpatient Remains inpatient appropriate because: End-of-life care   Final disposition: Anticipate in-hospital death   55 minutes with more than 50% spent in reviewing records, counseling patient/family and coordinating care.  Consultants:  Critical care Palliative medicine  Procedures: None  Microbiology summarized: COVID-19, influenza and RSV PCR nonreactive 20 pathogen RVP nonreactive Blood cultures NGTD Urine cultures multiple species MRSA PCR screen positive  Objective: Vitals:   07/12/24 1706 07/13/24 0604 07/13/24 2115 07/14/24 1140  BP:   101/67 104/71  Pulse: 90  (!) 119 (!) 121  Resp: (!) 9 10 16 18   Temp:   98.6 F (37 C) 98.5 F (36.9 C)  TempSrc:   Oral Oral  SpO2:   99% 98%  Weight:      Height:        Examination:  GENERAL: No apparent distress.  Somnolent. RESP:  No IWOB.  Breathing shallow, rattled with apneas SKIN: no apparent skin lesion or wound NEURO: Somnolent. PSYCH: No distress or agitation.  Sch Meds:  Scheduled Meds:  glycopyrrolate   0.4 mg Intravenous Q4H   ketorolac   15 mg Intravenous Q6H   LORazepam   2 mg Intravenous Q4H   mouth rinse  15 mL Mouth Rinse 4 times per day   scopolamine  1 patch Transdermal Q72H   Continuous Infusions:  HYDROmorphone  19 mg/hr (07/14/24 1244)   PRN Meds:.acetaminophen  **OR**  acetaminophen , artificial tears, HYDROmorphone , midazolam  PF  Antimicrobials: Anti-infectives (From admission, onward)    Start     Dose/Rate Route Frequency Ordered Stop   07/09/24 1400  vancomycin  (VANCOREADY) IVPB 1250 mg/250 mL  Status:  Discontinued        1,250 mg 166.7 mL/hr over 90 Minutes Intravenous Every 48 hours 07/09/24 1239 07/10/24 2120   07/09/24 1100  piperacillin -tazobactam (ZOSYN ) IVPB 3.375 g  Status:  Discontinued        3.375 g 12.5 mL/hr over 240 Minutes Intravenous Every 8 hours 07/09/24 1012 07/10/24 2120   07/09/24 0815  azithromycin  (ZITHROMAX ) 500 mg in sodium chloride  0.9 % 250 mL IVPB  Status:  Discontinued        500 mg 250 mL/hr over 60 Minutes Intravenous Every 24 hours 07/09/24 0804 07/10/24 2120   07/08/24 2000  metroNIDAZOLE  (FLAGYL ) IVPB 500 mg  Status:  Discontinued        500 mg 100 mL/hr over 60 Minutes Intravenous Every 12 hours 07/08/24 1904 07/09/24 1225   07/08/24 2000  cefTRIAXone  (ROCEPHIN ) 2 g in sodium chloride  0.9 % 100 mL IVPB  Status:  Discontinued        2 g 200 mL/hr over 30 Minutes Intravenous Every 24 hours 07/08/24 1904 07/09/24 0956   07/08/24 1500  cefTRIAXone  (ROCEPHIN )  1 g in sodium chloride  0.9 % 100 mL IVPB  Status:  Discontinued        1 g 200 mL/hr over 30 Minutes Intravenous  Once 07/08/24 1455 07/08/24 1456        I have personally reviewed the following labs and images: CBC: Recent Labs  Lab 07/08/24 1117 07/09/24 0030 07/09/24 0846 07/09/24 1323 07/09/24 1431 07/09/24 1438 07/09/24 1640 07/10/24 0403  WBC 10.5  --  14.1*  --   --   --  15.3* 20.6*  NEUTROABS 8.3*  --   --   --   --   --   --   --   HGB 9.8*   < > 11.9*  --  8.2* 11.9* 10.3* 9.6*  HCT 29.8*   < > 36.3  --  24.0* 35.0* 33.6* 29.0*  MCV 90.6  --  92.8  --   --   --  96.8 91.8  PLT 240  --  PLATELET CLUMPS NOTED ON SMEAR, COUNT APPEARS DECREASED 155  --   --  147* 152   < > = values in this interval not displayed.   BMP &GFR Recent Labs   Lab 07/08/24 1117 07/09/24 0030 07/09/24 0846 07/09/24 1431 07/09/24 1438 07/09/24 1440 07/09/24 1640 07/10/24 0403 07/10/24 1031  NA 143 140 143 144 145  --  145 141 141  K 2.5* 3.5 4.1 3.5 3.5  --  3.9 3.2* 4.9  CL 107 108 109  --  106  --  102 97* 98  CO2 21* 16* 8*  --   --   --  14* 22 22  GLUCOSE 108* 124* 148*  --  237*  --  260* 254* 215*  BUN 16 12 14   --  13  --  15 17 21   CREATININE 1.16* 1.00 1.25*  --  1.30*  --  1.48* 1.69* 1.84*  CALCIUM  8.8* 8.9 8.8*  --   --   --  9.2 8.4* 9.7  MG 1.1* 2.3 2.3  --   --  3.0*  --   --   --   PHOS  --   --  5.0*  --   --   --   --   --   --    Estimated Creatinine Clearance: 20.4 mL/min (A) (by C-G formula based on SCr of 1.84 mg/dL (H)). Liver & Pancreas: Recent Labs  Lab 07/08/24 1117 07/09/24 0846 07/09/24 1640 07/10/24 0403 07/10/24 1031  AST 15 183* 566* 2,176* 4,665*  ALT <5 64* 228* 909* 1,552*  ALKPHOS 53 86 100 92 75  BILITOT 0.3 0.6 0.9 0.5 0.5  PROT 6.1* 6.6 5.5* 5.4* 4.5*  ALBUMIN  3.2* 3.6 2.9* 3.0* 2.5*   Recent Labs  Lab 07/08/24 1117  LIPASE 15   No results for input(s): AMMONIA in the last 168 hours. Diabetic: No results for input(s): HGBA1C in the last 72 hours.  Recent Labs  Lab 07/09/24 2206 07/10/24 0020 07/10/24 0410 07/10/24 0758 07/10/24 1122  GLUCAP 267* 281* 244* 226* 204*   Cardiac Enzymes: No results for input(s): CKTOTAL, CKMB, CKMBINDEX, TROPONINI in the last 168 hours. Recent Labs    07/09/24 1323 07/10/24 0403  PROBNP >35,000.0* >35,000.0*   Coagulation Profile: Recent Labs  Lab 07/08/24 1117 07/09/24 1323  INR 1.1 1.6*   Thyroid  Function Tests: No results for input(s): TSH, T4TOTAL, FREET4, T3FREE, THYROIDAB in the last 72 hours. Lipid Profile: No results for input(s): CHOL, HDL, LDLCALC, TRIG, CHOLHDL,  LDLDIRECT in the last 72 hours. Anemia Panel: No results for input(s): VITAMINB12, FOLATE, FERRITIN, TIBC, IRON,  RETICCTPCT in the last 72 hours. Urine analysis:    Component Value Date/Time   COLORURINE YELLOW 07/08/2024 1113   APPEARANCEUR CLOUDY (A) 07/08/2024 1113   LABSPEC 1.012 07/08/2024 1113   PHURINE 8.0 07/08/2024 1113   GLUCOSEU NEGATIVE 07/08/2024 1113   HGBUR SMALL (A) 07/08/2024 1113   BILIRUBINUR NEGATIVE 07/08/2024 1113   BILIRUBINUR negative 08/15/2021 1537   KETONESUR NEGATIVE 07/08/2024 1113   PROTEINUR 100 (A) 07/08/2024 1113   UROBILINOGEN 0.2 08/15/2021 1537   UROBILINOGEN 1.0 03/26/2015 1441   NITRITE NEGATIVE 07/08/2024 1113   LEUKOCYTESUR LARGE (A) 07/08/2024 1113   Sepsis Labs: Invalid input(s): PROCALCITONIN, LACTICIDVEN  Microbiology: Recent Results (from the past 240 hours)  Urine Culture     Status: Abnormal   Collection Time: 07/08/24 11:13 AM   Specimen: Urine, Clean Catch  Result Value Ref Range Status   Specimen Description   Final    URINE, CLEAN CATCH Performed at [facility], [address] [place], [place] [address]    Special Requests   Final    NONE Performed at [facility], [address], [place], [place] [address]    Culture MULTIPLE SPECIES PRESENT, SUGGEST RECOLLECTION (A)  Final   Report Status 07/09/2024 FINAL  Final  Blood culture (routine x 2)     Status: None   Collection Time: 07/08/24  4:25 PM   Specimen: BLOOD  Result Value Ref Range Status   Specimen Description   Final    BLOOD PORTA CATH Performed at [facility], [address] [place], [place] [address]    Special Requests   Final    BOTTLES DRAWN AEROBIC AND ANAEROBIC Blood Culture adequate volume Performed at [facility], [address] [place], [place] [address]    Culture   Final    NO GROWTH 5 DAYS Performed at [facility], [address] [place], [place] [address]    Report Status 07/14/2024 FINAL  Final  Respiratory (~20 pathogens) panel by PCR     Status: None    Collection Time: 07/09/24  8:12 AM   Specimen: Nasopharyngeal Swab; Respiratory  Result Value Ref Range Status   Adenovirus NOT DETECTED NOT DETECTED Final   Coronavirus 229E NOT DETECTED NOT DETECTED Final    Comment: (NOTE) The Coronavirus on the Respiratory Panel, DOES NOT test for the novel  Coronavirus (2019 nCoV)    Coronavirus HKU1 NOT DETECTED NOT DETECTED Final   Coronavirus NL63 NOT DETECTED NOT DETECTED Final   Coronavirus OC43 NOT DETECTED NOT DETECTED Final   Metapneumovirus NOT DETECTED NOT DETECTED Final   Rhinovirus / Enterovirus NOT DETECTED NOT DETECTED Final   Influenza A NOT DETECTED NOT DETECTED Final   Influenza B NOT DETECTED NOT DETECTED Final   Parainfluenza Virus 1 NOT DETECTED NOT DETECTED Final   Parainfluenza Virus 2 NOT DETECTED NOT DETECTED Final   Parainfluenza Virus 3 NOT DETECTED NOT DETECTED Final   Parainfluenza Virus 4 NOT DETECTED NOT DETECTED Final   Respiratory Syncytial Virus NOT DETECTED NOT DETECTED Final   Bordetella pertussis NOT DETECTED NOT DETECTED Final   Bordetella Parapertussis NOT DETECTED NOT DETECTED Final   Chlamydophila pneumoniae NOT DETECTED NOT DETECTED Final   Mycoplasma pneumoniae NOT DETECTED NOT DETECTED Final    Comment: Performed at [facility], [address], [place], [place] [address]  Blood culture (routine x 2)     Status: None   Collection Time: 07/09/24  8:46 AM   Specimen: BLOOD  Result Value Ref Range Status   Specimen Description   Final    BLOOD SITE NOT SPECIFIED Performed at [facility], [address] [place], [place] [address]    Special Requests   Final    BOTTLES DRAWN AEROBIC ONLY Blood Culture results may not be optimal due to an inadequate volume of blood received in culture bottles Performed at [facility], [address] [place], [place] [address]    Culture   Final    NO GROWTH 5 DAYS Performed at [facility], [address],  [place], [place] [address]    Report Status 07/14/2024 FINAL  Final  MRSA Next Gen by PCR, Nasal     Status: Abnormal   Collection Time: 07/09/24  9:28 AM   Specimen: Nasopharyngeal Swab; Nasal Swab  Result Value Ref Range Status   MRSA by PCR Next Gen DETECTED (A) NOT DETECTED Final    Comment: RESULT CALLED TO, READ BACK BY AND VERIFIED WITH:  GRIFFIN,C 07/09/2024 1143 AJ (NOTE) The GeneXpert MRSA Assay (FDA approved for NASAL specimens only), is one component of a comprehensive MRSA colonization surveillance program. It is not intended to diagnose MRSA infection nor to guide or monitor treatment for MRSA infections. Test performance is not FDA approved in patients less than 47 years old. Performed at [facility], [address], [place], [place] [address]   SARS Coronavirus 2 by RT PCR (hospital order, performed in [facility] hospital lab) *cepheid single result test* Anterior Nasal Swab     Status: None   Collection Time: 07/09/24  4:40 PM   Specimen: Anterior Nasal Swab  Result Value Ref Range Status   SARS Coronavirus 2 by RT PCR NEGATIVE NEGATIVE Final    Comment: (NOTE) SARS-CoV-2 target nucleic acids are NOT DETECTED.  The SARS-CoV-2 RNA is generally detectable in upper and lower respiratory specimens during the acute phase of infection. The lowest concentration of SARS-CoV-2 viral copies this assay can detect is 250 copies / mL. A negative result does not preclude SARS-CoV-2 infection and should not be used as the sole basis for treatment or other patient management decisions.  A negative result may occur with improper specimen collection / handling, submission of specimen other than nasopharyngeal swab, presence of viral mutation(s) within the areas targeted by this assay, and inadequate number of viral copies (<250 copies / mL). A negative result must be combined with clinical observations, patient history, and epidemiological information.  Fact Sheet  for Patients:   roadlaptop.co.za  Fact Sheet for Healthcare Providers: http://kim-miller.com/  This test is not yet approved or  cleared by the [place]  FDA and has been authorized for detection and/or diagnosis of SARS-CoV-2 by FDA under an Emergency Use Authorization (EUA).  This EUA will remain in effect (meaning this test can be used) for the duration of the COVID-19 declaration under Section 564(b)(1) of the Act, 21 U.S.C. section 360bbb-3(b)(1), unless the authorization is terminated or revoked sooner.  Performed at [facility], [address] [place], [place] [address]     Radiology Studies: No results found.    Adaliz Dobis T. Taleeya Blondin [facility]  If 7PM-7AM, please contact night-coverage [email or web address] 07/14/2024, 1:26 PM

## 2024-07-15 DIAGNOSIS — E43 Unspecified severe protein-calorie malnutrition: Secondary | ICD-10-CM | POA: Diagnosis not present

## 2024-07-15 DIAGNOSIS — Z8551 Personal history of malignant neoplasm of bladder: Secondary | ICD-10-CM | POA: Diagnosis not present

## 2024-07-15 DIAGNOSIS — K920 Hematemesis: Secondary | ICD-10-CM | POA: Diagnosis not present

## 2024-07-15 DIAGNOSIS — Z66 Do not resuscitate: Secondary | ICD-10-CM | POA: Diagnosis not present

## 2024-07-15 DIAGNOSIS — Z515 Encounter for palliative care: Secondary | ICD-10-CM

## 2024-07-15 DIAGNOSIS — E872 Acidosis, unspecified: Secondary | ICD-10-CM | POA: Insufficient documentation

## 2024-07-15 DIAGNOSIS — K7200 Acute and subacute hepatic failure without coma: Secondary | ICD-10-CM | POA: Insufficient documentation

## 2024-07-15 NOTE — Progress Notes (Addendum)
 Daily Progress Note   Patient Name: [name]       Date: 07/15/2024 DOB: 12-10-1955  Age: 68 y.o. MRN#: [id number] Attending Physician: Crystal Mignon DASEN, MD Primary Care Physician: Crystal Harrie HERO, FNP Admit Date: 07/08/2024  Reason for Consultation/Follow-up: Establishing goals of care  Subjective: unresponsive  Length of Stay: 7  Current Medications: Scheduled Meds:   glycopyrrolate   0.4 mg Intravenous Q4H   ketorolac   15 mg Intravenous Q6H   LORazepam   2 mg Intravenous Q4H   mouth rinse  15 mL Mouth Rinse 4 times per day   scopolamine   1 patch Transdermal Q72H    Continuous Infusions:  HYDROmorphone  21 mg/hr (07/15/24 1200)    PRN Meds: acetaminophen  **OR** acetaminophen , artificial tears, HYDROmorphone , midazolam  PF  Physical Exam Constitutional:      Appearance: She is ill-appearing.     Comments: Unresponsive No signs of discomfort, face relaxed  Pulmonary:     Effort: Pulmonary effort is normal.     Comments: Breathing unlabored Skin:    General: Skin is warm and dry.             Vital Signs: BP (!) 92/56   Pulse (!) 118   Temp 98.9 F (37.2 C) (Oral)   Resp 13   Ht 5' (1.524 m)   Wt 54.4 kg   SpO2 98%   BMI 23.44 kg/m  SpO2: SpO2: 98 % O2 Device: O2 Device: Nasal Cannula O2 Flow Rate: O2 Flow Rate (L/min): 2 L/min  Intake/output summary:  Intake/Output Summary (Last 24 hours) at 07/15/2024 1305 Last data filed at 07/14/2024 1500 Gross per 24 hour  Intake 770.21 ml  Output --  Net 770.21 ml   LBM: Last BM Date :  (pta) Baseline Weight: Weight: 54.4 kg Most recent weight: Weight: 54.4 kg       Palliative Assessment/Data: PPS 10%      Patient Active Problem List   Diagnosis Date Noted   End of life care 07/15/2024   Shock liver 07/15/2024    Lactic acidosis 07/15/2024   Pancolitis 07/10/2024   GIB (gastrointestinal bleeding) 07/09/2024   Cardiogenic shock (HCC) 07/09/2024   Protein-calorie malnutrition, severe 07/09/2024   History of bladder cancer 07/09/2024   DNR (do not resuscitate) 07/09/2024   Hematemesis 07/08/2024   Hypokalemia 06/29/2024   Ureteral stent present 06/07/2024   Non-recurrent acute suppurative otitis media of both ears without spontaneous rupture of tympanic membranes 05/18/2024   Presence of heart assist device (HCC) 05/18/2024   H/O insertion of nephrostomy tube 05/18/2024   Ear fullness, left 04/22/2024   Cancer, metastatic to bone (HCC) 04/19/2024   Cardiomyopathy (HCC)    COPD (chronic obstructive pulmonary disease) (HCC)    Thyroid  disease    Nausea without vomiting 01/29/2024   Neuropathy 01/29/2024   Folate deficiency 01/08/2024   At risk for side effect of medication 12/18/2023   Decreased appetite 12/18/2023   Vertebral fracture, pathological 12/01/2023   Nephrostomy present (HCC) 12/01/2023   CKD (chronic kidney disease), stage III (HCC) 12/01/2023   Low vitamin B12 level 11/21/2023   Dietary folate deficiency anemia 11/21/2023   Iron deficiency 11/21/2023   Urothelial carcinoma of bladder (HCC)  11/18/2023   Bone lesion 11/18/2023   AKI (acute kidney injury) 11/14/2023   Paraplegia (HCC) 11/14/2023   Renal lesion 11/05/2023   Urge incontinence of urine 11/03/2023   Moderate protein-calorie malnutrition 11/03/2023   Community acquired pneumonia of left lower lobe of lung 09/25/2023   Vitamin D  deficiency 09/25/2023   Elevated serum glucose 06/23/2023   Fatigue 06/23/2023   Normocytic anemia 06/23/2023   Bilateral lower extremity edema 06/07/2023   Anemia of chronic disease 06/07/2023   Rheumatoid arthritis involving multiple sites (HCC) 04/07/2023   Arthralgia of both hands 04/07/2023   COPD exacerbation (HCC) 01/04/2023   Therapeutic opioid induced constipation 01/04/2023    Chronic active hepatitis (HCC) 01/04/2023   Migraine without aura and without status migrainosus, not intractable 01/04/2023   Primary insomnia 01/04/2023   Serosanguineous chronic otitis media of right ear 08/02/2021   Diverticulosis 06/25/2021   Abnormality of gait due to impairment of balance 11/22/2020   Nausea with vomiting    Mixed incontinence 10/20/2020   Malnutrition of moderate degree 10/20/2020   Septic shock (HCC) 10/17/2020   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   Mesenteric ischemia 10/16/2020   PTSD (post-traumatic stress disorder)    Hypoaldosteronism 07/13/2020   Hypotension 05/17/2020   Diarrhea 05/17/2020   BMI 26.0-26.9,adult 03/29/2020   Admission for long-term opiate analgesic use 10/24/2019   Presence of left artificial hip joint 10/24/2019   Osteoporosis 10/24/2019   Chronic hypoxemic respiratory failure (HCC) 10/24/2019   Other spondylosis with radiculopathy, lumbar region 10/24/2019   Depression, major, recurrent, moderate (HCC) 10/24/2019   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   Major depressive disorder, single episode, moderate (HCC) 10/24/2019   Senile osteoporosis 10/24/2019   Chronic pain of both knees 12/21/2018   Acute on chronic systolic heart failure (HCC) 06/19/2017   Dilated cardiomyopathy (HCC) 06/19/2017   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   Arthritis of right knee 11/27/2016   History of total hip replacement 09/25/2016   Arthritis of right hip 05/29/2016   Coronary artery disease involving native coronary artery of native heart without angina pectoris 05/31/2015   Mixed hyperlipidemia 05/31/2015   Dyslipidemia 05/31/2015   Ileus following gastrointestinal surgery (HCC) 03/26/2015   Chronic narcotic use 03/23/2015   GERD (gastroesophageal reflux disease)    Chronic back pain     Palliative Care Assessment & Plan   HPI: [name] is 68 y.o. w with  history of bladder cancer with bone mets, chronic pain on methadone , HFrEF NICM w AICD, COPD. Palliative care asked to get involved to support symptoms under comfort care.   Assessment: Follow up today. Daughter at bedside. She reports patient has been comfortable. She has some questions regarding what to look for to show discomfort - we reviewed this to include agitation, verbalization, grimacing, brow furrowing, tachypnea, or increased work of breathing. She expresses understanding.   We review her symptom management with dilaudid  infusion.   Some slight gurgling heard. RN to bring dose of robinul  soon. Discussed with daughter this is not uncomfortable to patient. Discussed if sounds worsen repositioning can be the most effective.   She requests I notify outpatient PCP of current condition and so I sent an epic chat message.   Some concern ICD had not been deactivated - Dr Crystal found in 11/15 note by cardiology note that it was deactivated.  Recommendations/Plan: Comfort measures only Continue dilaudid  infusion - appears comfortable on current dose Continue scheduled ativan  Continue scheduled robinul   Anticipate hospital death  Addendum: Return to room at family's request - RN had just administered ativan  as patient was moaning some after repositioning. Appeared comfortable while I was present. Family asked that we review hospital course - specifically the morning she was intubated. Reviewed this with them per notes. Multiple questions answered.   Prognosis:  Hours - Days  Discharge Planning: Anticipated Hospital Death  Care plan was discussed with RN, Dr Crystal, patient's daughter  Thank you for allowing the Palliative Medicine Team to assist in the care of this patient.   Total Time 60 minutes Prolonged Time Billed  no   Time spent includes: Detailed review of medical records (labs, imaging, vital signs), medically appropriate exam, discussion with treatment team, counseling  and educating patient, family and/or staff, documenting clinical information, medication management and coordination of care.     *Please note that this is a verbal dictation therefore any spelling or grammatical errors are due to the Dragon Medical One system interpretation.  Tobey Jama Barnacle, DNP, [facility] Palliative Medicine Team Team Phone # [phone]  Pager [phone]

## 2024-07-15 NOTE — Progress Notes (Signed)
 PROGRESS NOTE  [name] FMW:[id number] DOB: 1955-12-16   PCP: Teressa Harrie HERO, FNP  Patient is from: Home.  DOA: 07/08/2024 LOS: 7  Chief complaints Chief Complaint  Patient presents with   Hematemesis   Diarrhea     Brief Narrative / Interim history: 68 y/o F with a PMH significant for bladder cancer w/ bone metastasis on palliative chemo, chronic pain on methadone , HFimpEF s/p AICD (recovered EF 55-60% on 05/2023), and COPD who presented to [facility] ED for nausea, vomiting and diarrhea for 1 day and admitted with working diagnosis of intractable nausea and vomiting, pancolitis, dehydration and electrolyte derangements.   In ED, stable vitals.  K2.5. Cr 1.16.  Bicarb 21.  AG 16.  Glucose 108.  Mg 1.1.  WBC 10.5.  Hgb 9.8.  UA with large LE and few bacteria.  Hemoccult negative.  CT abdomen and pelvis suggested pancolitis without bowel obstruction, mild right hydronephrosis and right pigtail ureteral stent with proximal tip in the proximal ureter.  Cultures obtained.  Patient received 1 L LR bolus and 1 L NS bolus.  Started on potassium and magnesium  supplementation as well as ceftriaxone  and Flagyl  and admitted.  The next day on 11/14, patient became obtunded with hematemesis and intubated and transferred to ICU with working diagnosis of profound mixed cardiogenic and septic shock.  Antibiotics broadened and she was started on vasopressors and steroids as well.  CMP revealed elevated liver enzymes, AKI and hypokalemia.  Lactic acid 8.0.  proBNP > 35,000.  Troponin 486.  WBC up to 20.6.  Hgb remained stable at 9.6.  VBG metabolic acidosis.  TTE with LVEF of <20%, RWMA, mildly reduced RV SF.   Patient had worsening liver enzymes concerning for shock liver.  Also worsening lactic acidosis, AKI and hypotension.  Eventually transition to full comfort care on 11/15, transferred to hospitalist service on 11/17 for end-of-life care.  Palliative medicine consulted for help with end-of-life care.  She  is on Dilaudid  infusion.  Subjective: Seen and examined earlier this morning.  Daughter and grandson at the bedside.  Grandson was sleeping.  Shallow paresis with intermittent gasping.  Has not received bolus Dilaudid  overnight.  After talking to daughter, advised RN to give Dilaudid  bolus more frequently.   Assessment and plan: End-of-life care/full comfort care-patient developed multiorgan failure in the setting of cardiogenic shock.  Comfort care initiated on 11/15 while in ICU. - Palliative medicine consulted for assistance with end-of-life care - Comfort medications per palliative care-on Dilaudid  infusion with bolus. - Anticipate in hospital death in hours to days  Acute on chronic HFrEF/cardiogenic shock: TTE with LVEF of < 20% and RWMA (new).  He had TTE in 10/2022 with LVEF of 55 to 60% and G1 DD.  proBNP > 35,000.  Troponin 486 >> 655.  Patient received 2 L fluid boluses the night of admission and was on maintenance.  Also question about septic shock but no clear source of infection.  Possible septic shock: No clear source of infection other than pancolitis.  Surprisingly, she had no fever or leukocytosis on admission.  Blood cultures NGTD.  Urine culture with multiple species.  Over 19, influenza, RSV and a 20 pathogen RVP nonreactive.  Intractable nausea and vomiting: CT suggested pancolitis.  Unclear if she had C. difficile or other bacterial infection.  Initially started on ceftriaxone  and Flagyl .  Acute respiratory failure with hypoxia: Became obtunded with respiratory distress the morning of 11/14 requiring intubation and ICU transfer.  Elevated troponin: Likely  demand ischemia in the setting of cardiogenic shock/heart failure.  Shock liver: Likely ischemic insult in the setting of cardiogenic shock.  Acute kidney injury on CKD-3A: b/l Cr ~1.1.  Severe lactic acidosis  Hypokalemia/hypomagnesemia  High anion gap metabolic acidosis  History of bladder cancer with  metastasis  Hematemesis Normocytic anemia: H&H stable.  DO NOT RESUSCITATE   Severe malnutrition Body mass index is 23.44 kg/m. Nutrition Problem: Severe Malnutrition Etiology: chronic illness (bladder cancer with bone mets) Signs/Symptoms: moderate fat depletion, severe muscle depletion, percent weight loss (21% in 9 months) Percent weight loss: 21 % (in 9 months) Interventions: Refer to RD note for recommendations   DVT prophylaxis:  SCDs Start: 07/08/24 1828  Code Status: DNR-comfort Family Communication: Updated patient's daughter at bedside.  Grandson is sleepy. Level of care: Med-Surg Status is: Inpatient Remains inpatient appropriate because: End-of-life care   Final disposition: Anticipate in-hospital death   35 minutes with more than 50% spent in reviewing records, counseling patient/family and coordinating care.  Consultants:  Critical care Palliative medicine  Procedures: None  Microbiology summarized: COVID-19, influenza and RSV PCR nonreactive 20 pathogen RVP nonreactive Blood cultures NGTD Urine cultures multiple species MRSA PCR screen positive  Objective: Vitals:   07/13/24 0604 07/13/24 2115 07/14/24 1140 07/14/24 1954  BP:  101/67 104/71 (!) 92/56  Pulse:  (!) 119 (!) 121 (!) 118  Resp: 10 16 18 13   Temp:  98.6 F (37 C) 98.5 F (36.9 C) 98.9 F (37.2 C)  TempSrc:  Oral Oral Oral  SpO2:  99% 98% 98%  Weight:      Height:        Examination:  GENERAL: No apparent distress.  Somnolent. RESP:  No IWOB.  Breathing shallow, rattled with apneas SKIN: no apparent skin lesion or wound NEURO: Somnolent. PSYCH: No distress or agitation.  Sch Meds:  Scheduled Meds:  glycopyrrolate   0.4 mg Intravenous Q4H   ketorolac   15 mg Intravenous Q6H   LORazepam   2 mg Intravenous Q4H   mouth rinse  15 mL Mouth Rinse 4 times per day   scopolamine   1 patch Transdermal Q72H   Continuous Infusions:  HYDROmorphone  21 mg/hr (07/15/24 1026)   PRN  Meds:.acetaminophen  **OR** acetaminophen , artificial tears, HYDROmorphone , midazolam  PF  Antimicrobials: Anti-infectives (From admission, onward)    Start     Dose/Rate Route Frequency Ordered Stop   07/09/24 1400  vancomycin  (VANCOREADY) IVPB 1250 mg/250 mL  Status:  Discontinued        1,250 mg 166.7 mL/hr over 90 Minutes Intravenous Every 48 hours 07/09/24 1239 07/10/24 2120   07/09/24 1100  piperacillin -tazobactam (ZOSYN ) IVPB 3.375 g  Status:  Discontinued        3.375 g 12.5 mL/hr over 240 Minutes Intravenous Every 8 hours 07/09/24 1012 07/10/24 2120   07/09/24 0815  azithromycin  (ZITHROMAX ) 500 mg in sodium chloride  0.9 % 250 mL IVPB  Status:  Discontinued        500 mg 250 mL/hr over 60 Minutes Intravenous Every 24 hours 07/09/24 0804 07/10/24 2120   07/08/24 2000  metroNIDAZOLE  (FLAGYL ) IVPB 500 mg  Status:  Discontinued        500 mg 100 mL/hr over 60 Minutes Intravenous Every 12 hours 07/08/24 1904 07/09/24 1225   07/08/24 2000  cefTRIAXone  (ROCEPHIN ) 2 g in sodium chloride  0.9 % 100 mL IVPB  Status:  Discontinued        2 g 200 mL/hr over 30 Minutes Intravenous Every 24 hours 07/08/24 1904 07/09/24  9043   07/08/24 1500  cefTRIAXone  (ROCEPHIN ) 1 g in sodium chloride  0.9 % 100 mL IVPB  Status:  Discontinued        1 g 200 mL/hr over 30 Minutes Intravenous  Once 07/08/24 1455 07/08/24 1456        I have personally reviewed the following labs and images: CBC: Recent Labs  Lab 07/09/24 0846 07/09/24 1323 07/09/24 1431 07/09/24 1438 07/09/24 1640 07/10/24 0403  WBC 14.1*  --   --   --  15.3* 20.6*  HGB 11.9*  --  8.2* 11.9* 10.3* 9.6*  HCT 36.3  --  24.0* 35.0* 33.6* 29.0*  MCV 92.8  --   --   --  96.8 91.8  PLT PLATELET CLUMPS NOTED ON SMEAR, COUNT APPEARS DECREASED 155  --   --  147* 152   BMP &GFR Recent Labs  Lab 07/09/24 0030 07/09/24 0846 07/09/24 1431 07/09/24 1438 07/09/24 1440 07/09/24 1640 07/10/24 0403 07/10/24 1031  NA 140 143 144 145  --  145  141 141  K 3.5 4.1 3.5 3.5  --  3.9 3.2* 4.9  CL 108 109  --  106  --  102 97* 98  CO2 16* 8*  --   --   --  14* 22 22  GLUCOSE 124* 148*  --  237*  --  260* 254* 215*  BUN 12 14  --  13  --  15 17 21   CREATININE 1.00 1.25*  --  1.30*  --  1.48* 1.69* 1.84*  CALCIUM  8.9 8.8*  --   --   --  9.2 8.4* 9.7  MG 2.3 2.3  --   --  3.0*  --   --   --   PHOS  --  5.0*  --   --   --   --   --   --    Estimated Creatinine Clearance: 20.4 mL/min (A) (by C-G formula based on SCr of 1.84 mg/dL (H)). Liver & Pancreas: Recent Labs  Lab 07/09/24 0846 07/09/24 1640 07/10/24 0403 07/10/24 1031  AST 183* 566* 2,176* 4,665*  ALT 64* 228* 909* 1,552*  ALKPHOS 86 100 92 75  BILITOT 0.6 0.9 0.5 0.5  PROT 6.6 5.5* 5.4* 4.5*  ALBUMIN  3.6 2.9* 3.0* 2.5*   No results for input(s): LIPASE, AMYLASE in the last 168 hours.  No results for input(s): AMMONIA in the last 168 hours. Diabetic: No results for input(s): HGBA1C in the last 72 hours.  Recent Labs  Lab 07/09/24 2206 07/10/24 0020 07/10/24 0410 07/10/24 0758 07/10/24 1122  GLUCAP 267* 281* 244* 226* 204*   Cardiac Enzymes: No results for input(s): CKTOTAL, CKMB, CKMBINDEX, TROPONINI in the last 168 hours. Recent Labs    07/09/24 1323 07/10/24 0403  PROBNP >35,000.0* >35,000.0*   Coagulation Profile: Recent Labs  Lab 07/09/24 1323  INR 1.6*   Thyroid  Function Tests: No results for input(s): TSH, T4TOTAL, FREET4, T3FREE, THYROIDAB in the last 72 hours. Lipid Profile: No results for input(s): CHOL, HDL, LDLCALC, TRIG, CHOLHDL, LDLDIRECT in the last 72 hours. Anemia Panel: No results for input(s): VITAMINB12, FOLATE, FERRITIN, TIBC, IRON, RETICCTPCT in the last 72 hours. Urine analysis:    Component Value Date/Time   COLORURINE YELLOW 07/08/2024 1113   APPEARANCEUR CLOUDY (A) 07/08/2024 1113   LABSPEC 1.012 07/08/2024 1113   PHURINE 8.0 07/08/2024 1113   GLUCOSEU NEGATIVE  07/08/2024 1113   HGBUR SMALL (A) 07/08/2024 1113   BILIRUBINUR NEGATIVE 07/08/2024 1113  BILIRUBINUR negative 08/15/2021 1537   KETONESUR NEGATIVE 07/08/2024 1113   PROTEINUR 100 (A) 07/08/2024 1113   UROBILINOGEN 0.2 08/15/2021 1537   UROBILINOGEN 1.0 03/26/2015 1441   NITRITE NEGATIVE 07/08/2024 1113   LEUKOCYTESUR LARGE (A) 07/08/2024 1113   Sepsis Labs: Invalid input(s): PROCALCITONIN, LACTICIDVEN  Microbiology: Recent Results (from the past 240 hours)  Urine Culture     Status: Abnormal   Collection Time: 07/08/24 11:13 AM   Specimen: Urine, Clean Catch  Result Value Ref Range Status   Specimen Description   Final    URINE, CLEAN CATCH Performed at [facility], [address], [place], [place] [address]    Special Requests   Final    NONE Performed at [facility], [address] [place], [place] [address]    Culture MULTIPLE SPECIES PRESENT, SUGGEST RECOLLECTION (A)  Final   Report Status 07/09/2024 FINAL  Final  Blood culture (routine x 2)     Status: None   Collection Time: 07/08/24  4:25 PM   Specimen: BLOOD  Result Value Ref Range Status   Specimen Description   Final    BLOOD PORTA CATH Performed at [facility], [address] [place], [place] [address]    Special Requests   Final    BOTTLES DRAWN AEROBIC AND ANAEROBIC Blood Culture adequate volume Performed at [facility], [address], [place], [place] [address]    Culture   Final    NO GROWTH 5 DAYS Performed at [facility], [address], [place], [place] [address]    Report Status 07/14/2024 FINAL  Final  Respiratory (~20 pathogens) panel by PCR     Status: None   Collection Time: 07/09/24  8:12 AM   Specimen: Nasopharyngeal Swab; Respiratory  Result Value Ref Range Status   Adenovirus NOT DETECTED NOT DETECTED Final   Coronavirus 229E NOT DETECTED NOT DETECTED Final    Comment: (NOTE) The Coronavirus  on the Respiratory Panel, DOES NOT test for the novel  Coronavirus (2019 nCoV)    Coronavirus HKU1 NOT DETECTED NOT DETECTED Final   Coronavirus NL63 NOT DETECTED NOT DETECTED Final   Coronavirus OC43 NOT DETECTED NOT DETECTED Final   Metapneumovirus NOT DETECTED NOT DETECTED Final   Rhinovirus / Enterovirus NOT DETECTED NOT DETECTED Final   Influenza A NOT DETECTED NOT DETECTED Final   Influenza B NOT DETECTED NOT DETECTED Final   Parainfluenza Virus 1 NOT DETECTED NOT DETECTED Final   Parainfluenza Virus 2 NOT DETECTED NOT DETECTED Final   Parainfluenza Virus 3 NOT DETECTED NOT DETECTED Final   Parainfluenza Virus 4 NOT DETECTED NOT DETECTED Final   Respiratory Syncytial Virus NOT DETECTED NOT DETECTED Final   Bordetella pertussis NOT DETECTED NOT DETECTED Final   Bordetella Parapertussis NOT DETECTED NOT DETECTED Final   Chlamydophila pneumoniae NOT DETECTED NOT DETECTED Final   Mycoplasma pneumoniae NOT DETECTED NOT DETECTED Final    Comment: Performed at [facility], [address] [place], [place] [address]  Blood culture (routine x 2)     Status: None   Collection Time: 07/09/24  8:46 AM   Specimen: BLOOD  Result Value Ref Range Status   Specimen Description   Final    BLOOD SITE NOT SPECIFIED Performed at [facility], [address], [place], [place] [address]    Special Requests   Final    BOTTLES DRAWN AEROBIC ONLY Blood Culture results may not be  optimal due to an inadequate volume of blood received in culture bottles Performed at [facility], [address] [place], [place] [address]    Culture   Final    NO GROWTH 5 DAYS Performed at [facility], [address], [place], [place] [address]    Report Status 07/14/2024 FINAL  Final  MRSA Next Gen by PCR, Nasal     Status: Abnormal   Collection Time: 07/09/24  9:28 AM   Specimen: Nasopharyngeal Swab; Nasal Swab  Result Value Ref Range Status   MRSA by PCR Next  Gen DETECTED (A) NOT DETECTED Final    Comment: RESULT CALLED TO, READ BACK BY AND VERIFIED WITH:  GRIFFIN,C 07/09/2024 1143 AJ (NOTE) The GeneXpert MRSA Assay (FDA approved for NASAL specimens only), is one component of a comprehensive MRSA colonization surveillance program. It is not intended to diagnose MRSA infection nor to guide or monitor treatment for MRSA infections. Test performance is not FDA approved in patients less than 76 years old. Performed at [facility], [address] [place], [place] [address]   SARS Coronavirus 2 by RT PCR (hospital order, performed in [facility] hospital lab) *cepheid single result test* Anterior Nasal Swab     Status: None   Collection Time: 07/09/24  4:40 PM   Specimen: Anterior Nasal Swab  Result Value Ref Range Status   SARS Coronavirus 2 by RT PCR NEGATIVE NEGATIVE Final    Comment: (NOTE) SARS-CoV-2 target nucleic acids are NOT DETECTED.  The SARS-CoV-2 RNA is generally detectable in upper and lower respiratory specimens during the acute phase of infection. The lowest concentration of SARS-CoV-2 viral copies this assay can detect is 250 copies / mL. A negative result does not preclude SARS-CoV-2 infection and should not be used as the sole basis for treatment or other patient management decisions.  A negative result may occur with improper specimen collection / handling, submission of specimen other than nasopharyngeal swab, presence of viral mutation(s) within the areas targeted by this assay, and inadequate number of viral copies (<250 copies / mL). A negative result must be combined with clinical observations, patient history, and epidemiological information.  Fact Sheet for Patients:   roadlaptop.co.za  Fact Sheet for Healthcare Providers: http://kim-miller.com/  This test is not yet approved or  cleared by the [place]  FDA and has been authorized for  detection and/or diagnosis of SARS-CoV-2 by FDA under an Emergency Use Authorization (EUA).  This EUA will remain in effect (meaning this test can be used) for the duration of the COVID-19 declaration under Section 564(b)(1) of the Act, 21 U.S.C. section 360bbb-3(b)(1), unless the authorization is terminated or revoked sooner.  Performed at [facility], [address] [place], [place] [address]     Radiology Studies: No results found.    Aiesha Leland T. Lynise Porr [facility]  If 7PM-7AM, please contact night-coverage [email or web address] 07/15/2024, 11:32 AM

## 2024-07-15 NOTE — Plan of Care (Signed)
  Problem: Activity: Goal: Ability to tolerate increased activity will improve Outcome: Not Progressing   Problem: Respiratory: Goal: Ability to maintain a clear airway and adequate ventilation will improve Outcome: Not Progressing   Problem: Role Relationship: Goal: Method of communication will improve Outcome: Not Progressing   Problem: Education: Goal: Knowledge of General Education information will improve Description: Including pain rating scale, medication(s)/side effects and non-pharmacologic comfort measures Outcome: Not Progressing   Problem: Health Behavior/Discharge Planning: Goal: Ability to manage health-related needs will improve Outcome: Not Progressing   Problem: Clinical Measurements: Goal: Ability to maintain clinical measurements within normal limits will improve Outcome: Not Progressing Goal: Will remain free from infection Outcome: Not Progressing Goal: Diagnostic test results will improve Outcome: Not Progressing Goal: Respiratory complications will improve Outcome: Not Progressing Goal: Cardiovascular complication will be avoided Outcome: Not Progressing   Problem: Activity: Goal: Risk for activity intolerance will decrease Outcome: Not Progressing   Problem: Nutrition: Goal: Adequate nutrition will be maintained Outcome: Not Progressing   Problem: Coping: Goal: Level of anxiety will decrease Outcome: Not Progressing   Problem: Elimination: Goal: Will not experience complications related to bowel motility Outcome: Not Progressing Goal: Will not experience complications related to urinary retention Outcome: Not Progressing   Problem: Pain Managment: Goal: General experience of comfort will improve and/or be controlled Outcome: Not Progressing   Problem: Safety: Goal: Ability to remain free from injury will improve Outcome: Not Progressing   Problem: Skin Integrity: Goal: Risk for impaired skin integrity will decrease Outcome: Not  Progressing   Problem: Education: Goal: Ability to describe self-care measures that may prevent or decrease complications (Diabetes Survival Skills Education) will improve Outcome: Not Progressing Goal: Individualized Educational Video(s) Outcome: Not Progressing   Problem: Coping: Goal: Ability to adjust to condition or change in health will improve Outcome: Not Progressing   Problem: Fluid Volume: Goal: Ability to maintain a balanced intake and output will improve Outcome: Not Progressing   Problem: Health Behavior/Discharge Planning: Goal: Ability to identify and utilize available resources and services will improve Outcome: Not Progressing Goal: Ability to manage health-related needs will improve Outcome: Not Progressing   Problem: Metabolic: Goal: Ability to maintain appropriate glucose levels will improve Outcome: Not Progressing   Problem: Nutritional: Goal: Maintenance of adequate nutrition will improve Outcome: Not Progressing Goal: Progress toward achieving an optimal weight will improve Outcome: Not Progressing   Problem: Skin Integrity: Goal: Risk for impaired skin integrity will decrease Outcome: Not Progressing   Problem: Tissue Perfusion: Goal: Adequacy of tissue perfusion will improve Outcome: Not Progressing

## 2024-07-15 NOTE — Progress Notes (Signed)
 Wasted 15ml from previous bag with Massey,RN

## 2024-07-16 DIAGNOSIS — Z66 Do not resuscitate: Secondary | ICD-10-CM | POA: Diagnosis not present

## 2024-07-16 DIAGNOSIS — E43 Unspecified severe protein-calorie malnutrition: Secondary | ICD-10-CM | POA: Diagnosis not present

## 2024-07-16 DIAGNOSIS — Z8551 Personal history of malignant neoplasm of bladder: Secondary | ICD-10-CM | POA: Diagnosis not present

## 2024-07-16 DIAGNOSIS — Z515 Encounter for palliative care: Secondary | ICD-10-CM | POA: Diagnosis not present

## 2024-07-16 DIAGNOSIS — K920 Hematemesis: Secondary | ICD-10-CM | POA: Diagnosis not present

## 2024-07-16 DIAGNOSIS — K529 Noninfective gastroenteritis and colitis, unspecified: Secondary | ICD-10-CM | POA: Diagnosis not present

## 2024-07-16 MED ORDER — HYDROMORPHONE BOLUS VIA INFUSION
4.0000 mg | INTRAVENOUS | Status: DC | PRN
  Administered 2024-07-16 – 2024-07-18 (×5): 4 mg via INTRAVENOUS

## 2024-07-16 MED ORDER — HYDROMORPHONE HCL-NACL 50-0.9 MG/50ML-% IV SOLN
6.0000 mg/h | INTRAVENOUS | Status: DC
  Administered 2024-07-16 (×4): 23 mg/h via INTRAVENOUS
  Administered 2024-07-17: 25 mg/h via INTRAVENOUS
  Administered 2024-07-17 (×6): 23 mg/h via INTRAVENOUS
  Administered 2024-07-17: 25 mg/h via INTRAVENOUS
  Administered 2024-07-17: 23 mg/h via INTRAVENOUS
  Administered 2024-07-17 – 2024-07-18 (×2): 25 mg/h via INTRAVENOUS
  Administered 2024-07-18 (×2): 24 mg/h via INTRAVENOUS
  Administered 2024-07-18: 25 mg/h via INTRAVENOUS
  Administered 2024-07-18: 24 mg/h via INTRAVENOUS
  Administered 2024-07-18: 25 mg/h via INTRAVENOUS
  Administered 2024-07-18: 24 mg/h via INTRAVENOUS
  Administered 2024-07-18 (×2): 25 mg/h via INTRAVENOUS
  Administered 2024-07-18: 24 mg/h via INTRAVENOUS
  Filled 2024-07-16 (×17): qty 50
  Filled 2024-07-16: qty 100
  Filled 2024-07-16 (×8): qty 50

## 2024-07-16 NOTE — Progress Notes (Signed)
 Palliative Medicine Inpatient Follow Up Note   HPI: Ms. [name] is a 68 yo female with bladder cancer with bone mets, chronic pain on methadone , HFrEF, NICM with AICD, and COPD.   PMT was consulted to provide support and get involved with symptom management for comfort care.    Today's Discussion 07/16/2024   Chart reviewed inclusive of vital signs, progress notes. Vitals from 07/17/2023 1228 revealed BP 94/60, pulse 120, and RR 26.  Progress note of Dr. Kathrin from 11/21 reviewed, patient is currently receiving end of life care following multiorgan failure in the setting of cardiogenic shock.   I visited patient at bedside today, with several family members present. Patient appears chronically-ill,  lying comfortably in bed, no signs of apparent distress. She is non responsive to verbal stimuli.  No manifestation of pain and or discomforts.  Breathing spontaneously, noted for some pauses. Also noted for intermittent course sound from behind her throat, indicating pooling of airway/oral secretions.   Plan of care remains unchanged, continue with comfort focused care with anticipation of hospital death. [name] brought up some reasonable and insightful questions about symptom management approach and end-of-life expectations. Vital signs will be checked once daily or per family request to optimize comforts. I discussed that we will continue to perform symptom check and will adjust medication as needed to ensure patient is optimized.    Patient is currently on and will continue on dilaudid  drip at 23 mg/hour, and to consider titration based on tolerability and effectiveness. Also to continue with Dilaudid  4mg  bolus every 10 minutes PRN for dyspnea/pain.   I shared that I am the weekend provider and will be visiting patient daily to check on her status and ensure symptom management is adequate.   Created space and opportunity for patient to explore thoughts feelings and fears regarding current  medical situation.  Questions and concerns addressed   Palliative Support Provided.   Objective Assessment: Vital Signs Vitals:   07/15/24 2139 07/16/24 1228  BP: (!) 81/61 94/60  Pulse: (!) 114 (!) 120  Resp:  (!) 26  Temp: 98 F (36.7 C)   SpO2: 93% 92%   No intake or output data in the 24 hours ending 07/16/24 1623 Last Weight  Most recent update: 07/08/2024  5:19 PM    Weight  54.4 kg (120 lb)             Gen:  Ill-appering, appears comfortable.  HEENT: moist mucous membranes CV: Tachycardia PULM: Breathing evenly with periods of short pauses ABD: soft/nontender/nondistended/normal bowel sounds EXT: No edema Neuro: UTA, unresponsive  SUMMARY OF RECOMMENDATIONS   Code Status: Transition to DNR-Comfort Continue to provide psycho-social and emotional support to patient and family Palliative medicine team will continue to follow.  Comfort cart for family Unrestricted visitations in the setting of EOL (per policy)  Symptom Management:  Continue with hydromorphone  infusion for pain/air hunger/comfort Continue with scheduled Robinul   for excessive secretions Continue with scheduled Ativan  for agitation/anxiety May have comfort feeding Oxygen  PRN 2L or less for comfort. No escalation.    I personally spent a total of 50 minutes in the care of the patient today including preparing to see the patient, getting/reviewing separately obtained history, performing a medically appropriate exam/evaluation, documenting clinical information in the EHR, and coordinating care.     ___________________________________________________________________ Kathlyne Bolder NP-C [facility] Palliative Medicine Team Team Cell Phone: [phone] Please utilize secure chat with additional questions, if there is no response within 30 minutes please call  the above phone number  Palliative Medicine Team providers are available by phone from 7am to 7pm daily and can be reached through the  team cell phone.  Should this patient require assistance outside of these hours, please call the patient's attending physician.

## 2024-07-16 NOTE — Plan of Care (Signed)
  Problem: Activity: Goal: Ability to tolerate increased activity will improve Outcome: Not Progressing   Problem: Respiratory: Goal: Ability to maintain a clear airway and adequate ventilation will improve Outcome: Not Progressing   Problem: Role Relationship: Goal: Method of communication will improve Outcome: Not Progressing   Problem: Education: Goal: Knowledge of General Education information will improve Description: Including pain rating scale, medication(s)/side effects and non-pharmacologic comfort measures Outcome: Not Progressing   Problem: Health Behavior/Discharge Planning: Goal: Ability to manage health-related needs will improve Outcome: Not Progressing   Problem: Clinical Measurements: Goal: Ability to maintain clinical measurements within normal limits will improve Outcome: Not Progressing Goal: Will remain free from infection Outcome: Not Progressing Goal: Diagnostic test results will improve Outcome: Not Progressing Goal: Respiratory complications will improve Outcome: Not Progressing Goal: Cardiovascular complication will be avoided Outcome: Not Progressing   Problem: Activity: Goal: Risk for activity intolerance will decrease Outcome: Not Progressing   Problem: Nutrition: Goal: Adequate nutrition will be maintained Outcome: Not Progressing   Problem: Coping: Goal: Level of anxiety will decrease Outcome: Not Progressing   Problem: Elimination: Goal: Will not experience complications related to bowel motility Outcome: Not Progressing Goal: Will not experience complications related to urinary retention Outcome: Not Progressing   Problem: Pain Managment: Goal: General experience of comfort will improve and/or be controlled Outcome: Not Progressing   Problem: Safety: Goal: Ability to remain free from injury will improve Outcome: Not Progressing   Problem: Skin Integrity: Goal: Risk for impaired skin integrity will decrease Outcome: Not  Progressing   Problem: Education: Goal: Ability to describe self-care measures that may prevent or decrease complications (Diabetes Survival Skills Education) will improve Outcome: Not Progressing Goal: Individualized Educational Video(s) Outcome: Not Progressing   Problem: Coping: Goal: Ability to adjust to condition or change in health will improve Outcome: Not Progressing   Problem: Fluid Volume: Goal: Ability to maintain a balanced intake and output will improve Outcome: Not Progressing   Problem: Health Behavior/Discharge Planning: Goal: Ability to identify and utilize available resources and services will improve Outcome: Not Progressing Goal: Ability to manage health-related needs will improve Outcome: Not Progressing   Problem: Metabolic: Goal: Ability to maintain appropriate glucose levels will improve Outcome: Not Progressing   Problem: Nutritional: Goal: Maintenance of adequate nutrition will improve Outcome: Not Progressing Goal: Progress toward achieving an optimal weight will improve Outcome: Not Progressing   Problem: Skin Integrity: Goal: Risk for impaired skin integrity will decrease Outcome: Not Progressing   Problem: Tissue Perfusion: Goal: Adequacy of tissue perfusion will improve Outcome: Not Progressing

## 2024-07-16 NOTE — Progress Notes (Signed)
 PROGRESS NOTE  [name] FMW:[id number] DOB: 05/12/1956   PCP: Teressa Harrie HERO, FNP  Patient is from: Home.  DOA: 07/08/2024 LOS: 8  Chief complaints Chief Complaint  Patient presents with   Hematemesis   Diarrhea     Brief Narrative / Interim history: 68 y/o F with a PMH significant for bladder cancer w/ bone metastasis on palliative chemo, chronic pain on methadone , HFimpEF s/p AICD (recovered EF 55-60% on 05/2023), and COPD who presented to [facility] ED for nausea, vomiting and diarrhea for 1 day and admitted with working diagnosis of intractable nausea and vomiting, pancolitis, dehydration and electrolyte derangements.   In ED, stable vitals.  K2.5. Cr 1.16.  Bicarb 21.  AG 16.  Glucose 108.  Mg 1.1.  WBC 10.5.  Hgb 9.8.  UA with large LE and few bacteria.  Hemoccult negative.  CT abdomen and pelvis suggested pancolitis without bowel obstruction, mild right hydronephrosis and right pigtail ureteral stent with proximal tip in the proximal ureter.  Cultures obtained.  Patient received 1 L LR bolus and 1 L NS bolus.  Started on potassium and magnesium  supplementation as well as ceftriaxone  and Flagyl  and admitted.  The next day on 11/14, patient became obtunded with hematemesis and intubated and transferred to ICU with working diagnosis of profound mixed cardiogenic and septic shock.  Antibiotics broadened and she was started on vasopressors and steroids as well.  CMP revealed elevated liver enzymes, AKI and hypokalemia.  Lactic acid 8.0.  proBNP > 35,000.  Troponin 486.  WBC up to 20.6.  Hgb remained stable at 9.6.  VBG metabolic acidosis.  TTE with LVEF of <20%, RWMA, mildly reduced RV SF.   Patient had worsening liver enzymes concerning for shock liver.  Also worsening lactic acidosis, AKI and hypotension.  Eventually transition to full comfort care on 11/15, transferred to hospitalist service on 11/17 for end-of-life care.  Palliative medicine consulted for help with end-of-life care.  She  is on Dilaudid  infusion.  Subjective: Seen and examined earlier this morning.  Daughter and grandson at the bedside.  Grandson was sleeping.  Shallow paresis with intermittent gasping.  Has not received bolus Dilaudid  overnight.  After talking to daughter, advised RN to give Dilaudid  bolus more frequently.   Assessment and plan: End-of-life care/full comfort care-patient developed multiorgan failure in the setting of cardiogenic shock.  Comfort care initiated on 11/15 while in ICU. - Palliative medicine consulted for assistance with end-of-life care - On Dilaudid  infusion with bolus.  Increased bolus to 4 mg every 10 minutes.  Has work of breathing. - Anticipate in hospital death in hours to days  Acute on chronic HFrEF/cardiogenic shock: TTE with LVEF of < 20% and RWMA (new).  He had TTE in 10/2022 with LVEF of 55 to 60% and G1 DD.  proBNP > 35,000.  Troponin 486 >> 655.  Patient received 2 L fluid boluses the night of admission and was on maintenance.  Also question about septic shock but no clear source of infection.  Possible septic shock: No clear source of infection other than pancolitis.  Surprisingly, she had no fever or leukocytosis on admission.  Blood cultures NGTD.  Urine culture with multiple species.  Over 19, influenza, RSV and a 20 pathogen RVP nonreactive.  Intractable nausea and vomiting: CT suggested pancolitis.  Unclear if she had C. difficile or other bacterial infection.  Initially started on ceftriaxone  and Flagyl .  Acute respiratory failure with hypoxia: Became obtunded with respiratory distress the morning of  11/14 requiring intubation and ICU transfer.  Elevated troponin: Likely demand ischemia in the setting of cardiogenic shock/heart failure.  Shock liver: Likely ischemic insult in the setting of cardiogenic shock.  Acute kidney injury on CKD-3A: b/l Cr ~1.1.  Severe lactic acidosis  Hypokalemia/hypomagnesemia  High anion gap metabolic acidosis  History of  bladder cancer with metastasis  Hematemesis Normocytic anemia: H&H stable.  DO NOT RESUSCITATE   Severe malnutrition Body mass index is 23.44 kg/m. Nutrition Problem: Severe Malnutrition Etiology: chronic illness (bladder cancer with bone mets) Signs/Symptoms: moderate fat depletion, severe muscle depletion, percent weight loss (21% in 9 months) Percent weight loss: 21 % (in 9 months) Interventions: Refer to RD note for recommendations   DVT prophylaxis:  SCDs Start: 07/08/24 1828  Code Status: DNR-comfort Family Communication: Updated patient's son and daughter at bedside. Level of care: Med-Surg Status is: Inpatient Remains inpatient appropriate because: End-of-life care   Final disposition: Anticipate in-hospital death   35 minutes with more than 50% spent in reviewing records, counseling patient/family and coordinating care.  Consultants:  Critical care Palliative medicine  Procedures: None  Microbiology summarized: COVID-19, influenza and RSV PCR nonreactive 20 pathogen RVP nonreactive Blood cultures NGTD Urine cultures multiple species MRSA PCR screen positive  Objective: Vitals:   07/14/24 1140 07/14/24 1954 07/15/24 1319 07/15/24 2139  BP: 104/71 (!) 92/56 (!) 97/52 (!) 81/61  Pulse: (!) 121 (!) 118 (!) 109 (!) 114  Resp: 18 13    Temp: 98.5 F (36.9 C) 98.9 F (37.2 C) 98.3 F (36.8 C) 98 F (36.7 C)  TempSrc: Oral Oral Axillary Oral  SpO2: 98% 98% 94% 93%  Weight:      Height:        Examination:  GENERAL: No apparent distress.  Somnolent. RESP:  No IWOB.  Breathing shallow, rattled with apneas SKIN: no apparent skin lesion or wound NEURO: Somnolent. PSYCH: No distress or agitation.  Sch Meds:  Scheduled Meds:  glycopyrrolate   0.4 mg Intravenous Q4H   ketorolac   15 mg Intravenous Q6H   LORazepam   2 mg Intravenous Q4H   mouth rinse  15 mL Mouth Rinse 4 times per day   scopolamine   1 patch Transdermal Q72H   Continuous  Infusions:  HYDROmorphone  23 mg/hr (07/16/24 1020)   PRN Meds:.acetaminophen  **OR** acetaminophen , artificial tears, HYDROmorphone , midazolam  PF  Antimicrobials: Anti-infectives (From admission, onward)    Start     Dose/Rate Route Frequency Ordered Stop   07/09/24 1400  vancomycin  (VANCOREADY) IVPB 1250 mg/250 mL  Status:  Discontinued        1,250 mg 166.7 mL/hr over 90 Minutes Intravenous Every 48 hours 07/09/24 1239 07/10/24 2120   07/09/24 1100  piperacillin -tazobactam (ZOSYN ) IVPB 3.375 g  Status:  Discontinued        3.375 g 12.5 mL/hr over 240 Minutes Intravenous Every 8 hours 07/09/24 1012 07/10/24 2120   07/09/24 0815  azithromycin  (ZITHROMAX ) 500 mg in sodium chloride  0.9 % 250 mL IVPB  Status:  Discontinued        500 mg 250 mL/hr over 60 Minutes Intravenous Every 24 hours 07/09/24 0804 07/10/24 2120   07/08/24 2000  metroNIDAZOLE  (FLAGYL ) IVPB 500 mg  Status:  Discontinued        500 mg 100 mL/hr over 60 Minutes Intravenous Every 12 hours 07/08/24 1904 07/09/24 1225   07/08/24 2000  cefTRIAXone  (ROCEPHIN ) 2 g in sodium chloride  0.9 % 100 mL IVPB  Status:  Discontinued  2 g 200 mL/hr over 30 Minutes Intravenous Every 24 hours 07/08/24 1904 07/09/24 0956   07/08/24 1500  cefTRIAXone  (ROCEPHIN ) 1 g in sodium chloride  0.9 % 100 mL IVPB  Status:  Discontinued        1 g 200 mL/hr over 30 Minutes Intravenous  Once 07/08/24 1455 07/08/24 1456        I have personally reviewed the following labs and images: CBC: Recent Labs  Lab 07/09/24 1323 07/09/24 1431 07/09/24 1438 07/09/24 1640 07/10/24 0403  WBC  --   --   --  15.3* 20.6*  HGB  --  8.2* 11.9* 10.3* 9.6*  HCT  --  24.0* 35.0* 33.6* 29.0*  MCV  --   --   --  96.8 91.8  PLT 155  --   --  147* 152   BMP &GFR Recent Labs  Lab 07/09/24 1431 07/09/24 1438 07/09/24 1440 07/09/24 1640 07/10/24 0403 07/10/24 1031  NA 144 145  --  145 141 141  K 3.5 3.5  --  3.9 3.2* 4.9  CL  --  106  --  102 97* 98   CO2  --   --   --  14* 22 22  GLUCOSE  --  237*  --  260* 254* 215*  BUN  --  13  --  15 17 21   CREATININE  --  1.30*  --  1.48* 1.69* 1.84*  CALCIUM   --   --   --  9.2 8.4* 9.7  MG  --   --  3.0*  --   --   --    Estimated Creatinine Clearance: 20.4 mL/min (A) (by C-G formula based on SCr of 1.84 mg/dL (H)). Liver & Pancreas: Recent Labs  Lab 07/09/24 1640 07/10/24 0403 07/10/24 1031  AST 566* 2,176* 4,665*  ALT 228* 909* 1,552*  ALKPHOS 100 92 75  BILITOT 0.9 0.5 0.5  PROT 5.5* 5.4* 4.5*  ALBUMIN  2.9* 3.0* 2.5*   No results for input(s): LIPASE, AMYLASE in the last 168 hours.  No results for input(s): AMMONIA in the last 168 hours. Diabetic: No results for input(s): HGBA1C in the last 72 hours.  Recent Labs  Lab 07/09/24 2206 07/10/24 0020 07/10/24 0410 07/10/24 0758 07/10/24 1122  GLUCAP 267* 281* 244* 226* 204*   Cardiac Enzymes: No results for input(s): CKTOTAL, CKMB, CKMBINDEX, TROPONINI in the last 168 hours. Recent Labs    07/09/24 1323 07/10/24 0403  PROBNP >35,000.0* >35,000.0*   Coagulation Profile: Recent Labs  Lab 07/09/24 1323  INR 1.6*   Thyroid  Function Tests: No results for input(s): TSH, T4TOTAL, FREET4, T3FREE, THYROIDAB in the last 72 hours. Lipid Profile: No results for input(s): CHOL, HDL, LDLCALC, TRIG, CHOLHDL, LDLDIRECT in the last 72 hours. Anemia Panel: No results for input(s): VITAMINB12, FOLATE, FERRITIN, TIBC, IRON, RETICCTPCT in the last 72 hours. Urine analysis:    Component Value Date/Time   COLORURINE YELLOW 07/08/2024 1113   APPEARANCEUR CLOUDY (A) 07/08/2024 1113   LABSPEC 1.012 07/08/2024 1113   PHURINE 8.0 07/08/2024 1113   GLUCOSEU NEGATIVE 07/08/2024 1113   HGBUR SMALL (A) 07/08/2024 1113   BILIRUBINUR NEGATIVE 07/08/2024 1113   BILIRUBINUR negative 08/15/2021 1537   KETONESUR NEGATIVE 07/08/2024 1113   PROTEINUR 100 (A) 07/08/2024 1113   UROBILINOGEN 0.2  08/15/2021 1537   UROBILINOGEN 1.0 03/26/2015 1441   NITRITE NEGATIVE 07/08/2024 1113   LEUKOCYTESUR LARGE (A) 07/08/2024 1113   Sepsis Labs: Invalid input(s): PROCALCITONIN, LACTICIDVEN  Microbiology: Recent  Results (from the past 240 hours)  Urine Culture     Status: Abnormal   Collection Time: 07/08/24 11:13 AM   Specimen: Urine, Clean Catch  Result Value Ref Range Status   Specimen Description   Final    URINE, CLEAN CATCH Performed at [facility], [address] [place], [place] [address]    Special Requests   Final    NONE Performed at [facility], [address] [place], [place] [address]    Culture MULTIPLE SPECIES PRESENT, SUGGEST RECOLLECTION (A)  Final   Report Status 07/09/2024 FINAL  Final  Blood culture (routine x 2)     Status: None   Collection Time: 07/08/24  4:25 PM   Specimen: BLOOD  Result Value Ref Range Status   Specimen Description   Final    BLOOD PORTA CATH Performed at [facility], [address] [place], [place] [address]    Special Requests   Final    BOTTLES DRAWN AEROBIC AND ANAEROBIC Blood Culture adequate volume Performed at [facility], [address] [place], [place] [address]    Culture   Final    NO GROWTH 5 DAYS Performed at [facility], [address] [place], [place] [address]    Report Status 07/14/2024 FINAL  Final  Respiratory (~20 pathogens) panel by PCR     Status: None   Collection Time: 07/09/24  8:12 AM   Specimen: Nasopharyngeal Swab; Respiratory  Result Value Ref Range Status   Adenovirus NOT DETECTED NOT DETECTED Final   Coronavirus 229E NOT DETECTED NOT DETECTED Final    Comment: (NOTE) The Coronavirus on the Respiratory Panel, DOES NOT test for the novel  Coronavirus (2019 nCoV)    Coronavirus HKU1 NOT DETECTED NOT DETECTED Final   Coronavirus NL63 NOT DETECTED NOT DETECTED Final   Coronavirus OC43 NOT DETECTED NOT  DETECTED Final   Metapneumovirus NOT DETECTED NOT DETECTED Final   Rhinovirus / Enterovirus NOT DETECTED NOT DETECTED Final   Influenza A NOT DETECTED NOT DETECTED Final   Influenza B NOT DETECTED NOT DETECTED Final   Parainfluenza Virus 1 NOT DETECTED NOT DETECTED Final   Parainfluenza Virus 2 NOT DETECTED NOT DETECTED Final   Parainfluenza Virus 3 NOT DETECTED NOT DETECTED Final   Parainfluenza Virus 4 NOT DETECTED NOT DETECTED Final   Respiratory Syncytial Virus NOT DETECTED NOT DETECTED Final   Bordetella pertussis NOT DETECTED NOT DETECTED Final   Bordetella Parapertussis NOT DETECTED NOT DETECTED Final   Chlamydophila pneumoniae NOT DETECTED NOT DETECTED Final   Mycoplasma pneumoniae NOT DETECTED NOT DETECTED Final    Comment: Performed at [facility], [address], [place], [place] [address]  Blood culture (routine x 2)     Status: None   Collection Time: 07/09/24  8:46 AM   Specimen: BLOOD  Result Value Ref Range Status   Specimen Description   Final    BLOOD SITE NOT SPECIFIED Performed at [facility], [address] [place], [place] [address]    Special Requests   Final    BOTTLES DRAWN AEROBIC ONLY Blood Culture results may not be optimal due to an inadequate volume of blood received in culture bottles Performed at [facility], [address], [place], [place] [address]    Culture   Final    NO GROWTH 5 DAYS Performed at [facility], [address] [place], [place] [address]  Report Status 07/14/2024 FINAL  Final  MRSA Next Gen by PCR, Nasal     Status: Abnormal   Collection Time: 07/09/24  9:28 AM   Specimen: Nasopharyngeal Swab; Nasal Swab  Result Value Ref Range Status   MRSA by PCR Next Gen DETECTED (A) NOT DETECTED Final    Comment: RESULT CALLED TO, READ BACK BY AND VERIFIED WITH:  GRIFFIN,C 07/09/2024 1143 AJ (NOTE) The GeneXpert MRSA Assay (FDA approved for NASAL specimens only), is one component  of a comprehensive MRSA colonization surveillance program. It is not intended to diagnose MRSA infection nor to guide or monitor treatment for MRSA infections. Test performance is not FDA approved in patients less than 55 years old. Performed at [facility], [address] [place], [place] [address]   SARS Coronavirus 2 by RT PCR (hospital order, performed in [facility] hospital lab) *cepheid single result test* Anterior Nasal Swab     Status: None   Collection Time: 07/09/24  4:40 PM   Specimen: Anterior Nasal Swab  Result Value Ref Range Status   SARS Coronavirus 2 by RT PCR NEGATIVE NEGATIVE Final    Comment: (NOTE) SARS-CoV-2 target nucleic acids are NOT DETECTED.  The SARS-CoV-2 RNA is generally detectable in upper and lower respiratory specimens during the acute phase of infection. The lowest concentration of SARS-CoV-2 viral copies this assay can detect is 250 copies / mL. A negative result does not preclude SARS-CoV-2 infection and should not be used as the sole basis for treatment or other patient management decisions.  A negative result may occur with improper specimen collection / handling, submission of specimen other than nasopharyngeal swab, presence of viral mutation(s) within the areas targeted by this assay, and inadequate number of viral copies (<250 copies / mL). A negative result must be combined with clinical observations, patient history, and epidemiological information.  Fact Sheet for Patients:   roadlaptop.co.za  Fact Sheet for Healthcare Providers: http://kim-miller.com/  This test is not yet approved or  cleared by the [place]  FDA and has been authorized for detection and/or diagnosis of SARS-CoV-2 by FDA under an Emergency Use Authorization (EUA).  This EUA will remain in effect (meaning this test can be used) for the duration of the COVID-19 declaration under Section 564(b)(1) of  the Act, 21 U.S.C. section 360bbb-3(b)(1), unless the authorization is terminated or revoked sooner.  Performed at [facility], [address], [place], [place] [address]     Radiology Studies: No results found.    Dannielle Baskins T. Jahmai Finelli [facility]  If 7PM-7AM, please contact night-coverage [email or web address] 07/16/2024, 12:18 PM

## 2024-07-17 DIAGNOSIS — K920 Hematemesis: Secondary | ICD-10-CM | POA: Diagnosis not present

## 2024-07-17 DIAGNOSIS — E43 Unspecified severe protein-calorie malnutrition: Secondary | ICD-10-CM | POA: Diagnosis not present

## 2024-07-17 DIAGNOSIS — Z8551 Personal history of malignant neoplasm of bladder: Secondary | ICD-10-CM | POA: Diagnosis not present

## 2024-07-17 DIAGNOSIS — Z515 Encounter for palliative care: Secondary | ICD-10-CM | POA: Diagnosis not present

## 2024-07-17 DIAGNOSIS — Z66 Do not resuscitate: Secondary | ICD-10-CM | POA: Diagnosis not present

## 2024-07-17 MED ORDER — LORAZEPAM 2 MG/ML IJ SOLN
0.5000 mg/h | INTRAVENOUS | Status: DC
  Administered 2024-07-17: 2 mg/h via INTRAVENOUS
  Administered 2024-07-18: 3.5 mg/h via INTRAVENOUS
  Administered 2024-07-19: 7 mg/h via INTRAVENOUS
  Administered 2024-07-19: 5 mg/h via INTRAVENOUS
  Administered 2024-07-19: 5.5 mg/h via INTRAVENOUS
  Administered 2024-07-19: 2 mg/h via INTRAVENOUS
  Administered 2024-07-19: 6.5 mg/h via INTRAVENOUS
  Administered 2024-07-20: 8 mg/h via INTRAVENOUS
  Administered 2024-07-20: 7 mg/h via INTRAVENOUS
  Filled 2024-07-17 (×8): qty 25

## 2024-07-17 MED ORDER — CHLORHEXIDINE GLUCONATE CLOTH 2 % EX PADS: 6.0000 | MEDICATED_PAD | Freq: Every day | CUTANEOUS | Status: DC

## 2024-07-17 MED ORDER — GLYCOPYRROLATE 0.2 MG/ML IJ SOLN
0.3000 mg | Freq: Once | INTRAMUSCULAR | Status: AC
  Administered 2024-07-17: 0.3 mg via INTRAVENOUS
  Filled 2024-07-17: qty 2

## 2024-07-17 MED ORDER — LORAZEPAM 2 MG/ML IJ SOLN
1.0000 mg | Freq: Once | INTRAMUSCULAR | Status: AC
  Administered 2024-07-17: 1 mg via INTRAVENOUS
  Filled 2024-07-17: qty 1

## 2024-07-17 MED ORDER — SODIUM CHLORIDE 0.9% FLUSH: 10.0000 mL | INTRAVENOUS | Status: DC | PRN

## 2024-07-17 NOTE — Progress Notes (Signed)
 PROGRESS NOTE  [name] FMW:[id number] DOB: 09-03-1955   PCP: Teressa Harrie HERO, FNP  Patient is from: Home.  DOA: 07/08/2024 LOS: 9  Chief complaints Chief Complaint  Patient presents with   Hematemesis   Diarrhea     Brief Narrative / Interim history: 68 y/o F with a PMH significant for bladder cancer w/ bone metastasis on palliative chemo, chronic pain on methadone , HFimpEF s/p AICD (recovered EF 55-60% on 05/2023), and COPD who presented to [facility] ED for nausea, vomiting and diarrhea for 1 day and admitted with working diagnosis of intractable nausea and vomiting, pancolitis, dehydration and electrolyte derangements.   In ED, stable vitals.  K2.5. Cr 1.16.  Bicarb 21.  AG 16.  Glucose 108.  Mg 1.1.  WBC 10.5.  Hgb 9.8.  UA with large LE and few bacteria.  Hemoccult negative.  CT abdomen and pelvis suggested pancolitis without bowel obstruction, mild right hydronephrosis and right pigtail ureteral stent with proximal tip in the proximal ureter.  Cultures obtained.  Patient received 1 L LR bolus and 1 L NS bolus.  Started on potassium and magnesium  supplementation as well as ceftriaxone  and Flagyl  and admitted.  The next day on 11/14, patient became obtunded with hematemesis and intubated and transferred to ICU with working diagnosis of profound mixed cardiogenic and septic shock.  Antibiotics broadened and she was started on vasopressors and steroids as well.  CMP revealed elevated liver enzymes, AKI and hypokalemia.  Lactic acid 8.0.  proBNP > 35,000.  Troponin 486.  WBC up to 20.6.  Hgb remained stable at 9.6.  VBG metabolic acidosis.  TTE with LVEF of <20%, RWMA, mildly reduced RV SF.   Patient had worsening liver enzymes concerning for shock liver.  Also worsening lactic acidosis, AKI and hypotension.  Eventually transition to full comfort care on 11/15, transferred to hospitalist service on 11/17 for end-of-life care.  Palliative medicine consulted for help with end-of-life care.  She  is on Dilaudid  infusion.  Subjective: Seen and examined earlier this morning.  Family member at bedside.  No questions or concerns.  Assessment and plan: End-of-life care/full comfort care-patient developed multiorgan failure in the setting of cardiogenic shock.  Comfort care initiated on 11/15 while in ICU. - Palliative medicine consulted for assistance with end-of-life care - On Dilaudid  infusion with bolus. - Anticipate in hospital death  Acute on chronic HFrEF/cardiogenic shock: TTE with LVEF of < 20% and RWMA (new).  He had TTE in 10/2022 with LVEF of 55 to 60% and G1 DD.  proBNP > 35,000.  Troponin 486 >> 655.  Patient received 2 L fluid boluses the night of admission and was on maintenance.  Also question about septic shock but no clear source of infection.  Possible septic shock: No clear source of infection other than pancolitis.  Surprisingly, she had no fever or leukocytosis on admission.  Blood cultures NGTD.  Urine culture with multiple species.  Over 19, influenza, RSV and a 20 pathogen RVP nonreactive.  Intractable nausea and vomiting: CT suggested pancolitis.  Unclear if she had C. difficile or other bacterial infection.  Initially started on ceftriaxone  and Flagyl .  Acute respiratory failure with hypoxia: Became obtunded with respiratory distress the morning of 11/14 requiring intubation and ICU transfer.  Elevated troponin: Likely demand ischemia in the setting of cardiogenic shock/heart failure.  Shock liver: Likely ischemic insult in the setting of cardiogenic shock.  Acute kidney injury on CKD-3A: b/l Cr ~1.1.  Severe lactic acidosis  Hypokalemia/hypomagnesemia  High anion gap metabolic acidosis  History of bladder cancer with metastasis  Hematemesis Normocytic anemia: H&H stable.  DO NOT RESUSCITATE   Severe malnutrition Body mass index is 23.44 kg/m. Nutrition Problem: Severe Malnutrition Etiology: chronic illness (bladder cancer with bone  mets) Signs/Symptoms: moderate fat depletion, severe muscle depletion, percent weight loss (21% in 9 months) Percent weight loss: 21 % (in 9 months) Interventions: Refer to RD note for recommendations   DVT prophylaxis:  SCDs Start: 07/08/24 1828  Code Status: DNR-comfort Family Communication: Updated family member at bedside. Level of care: Med-Surg Status is: Inpatient Remains inpatient appropriate because: End-of-life care   Final disposition: Anticipate in-hospital death   35 minutes with more than 50% spent in reviewing records, counseling patient/family and coordinating care.  Consultants:  Critical care Palliative medicine  Procedures: None  Microbiology summarized: COVID-19, influenza and RSV PCR nonreactive 20 pathogen RVP nonreactive Blood cultures NGTD Urine cultures multiple species MRSA PCR screen positive  Objective: Vitals:   07/15/24 1319 07/15/24 2139 07/16/24 1228 07/16/24 1810  BP: (!) 97/52 (!) 81/61 94/60 90/63   Pulse: (!) 109 (!) 114 (!) 120 (!) 122  Resp:   (!) 26   Temp: 98.3 F (36.8 C) 98 F (36.7 C)  99 F (37.2 C)  TempSrc: Axillary Oral  Oral  SpO2: 94% 93% 92% 96%  Weight:      Height:        Examination:  GENERAL: No apparent distress.  Somnolent. RESP:  No IWOB.  Breathing shallow, rattled with apneas SKIN: no apparent skin lesion or wound NEURO: Somnolent. PSYCH: No distress or agitation.  Sch Meds:  Scheduled Meds:  glycopyrrolate   0.4 mg Intravenous Q4H   ketorolac   15 mg Intravenous Q6H   LORazepam   2 mg Intravenous Q4H   mouth rinse  15 mL Mouth Rinse 4 times per day   scopolamine   1 patch Transdermal Q72H   Continuous Infusions:  HYDROmorphone  23 mg/hr (07/17/24 1143)   PRN Meds:.acetaminophen  **OR** acetaminophen , artificial tears, HYDROmorphone , midazolam  PF  Antimicrobials: Anti-infectives (From admission, onward)    Start     Dose/Rate Route Frequency Ordered Stop   07/09/24 1400  vancomycin   (VANCOREADY) IVPB 1250 mg/250 mL  Status:  Discontinued        1,250 mg 166.7 mL/hr over 90 Minutes Intravenous Every 48 hours 07/09/24 1239 07/10/24 2120   07/09/24 1100  piperacillin -tazobactam (ZOSYN ) IVPB 3.375 g  Status:  Discontinued        3.375 g 12.5 mL/hr over 240 Minutes Intravenous Every 8 hours 07/09/24 1012 07/10/24 2120   07/09/24 0815  azithromycin  (ZITHROMAX ) 500 mg in sodium chloride  0.9 % 250 mL IVPB  Status:  Discontinued        500 mg 250 mL/hr over 60 Minutes Intravenous Every 24 hours 07/09/24 0804 07/10/24 2120   07/08/24 2000  metroNIDAZOLE  (FLAGYL ) IVPB 500 mg  Status:  Discontinued        500 mg 100 mL/hr over 60 Minutes Intravenous Every 12 hours 07/08/24 1904 07/09/24 1225   07/08/24 2000  cefTRIAXone  (ROCEPHIN ) 2 g in sodium chloride  0.9 % 100 mL IVPB  Status:  Discontinued        2 g 200 mL/hr over 30 Minutes Intravenous Every 24 hours 07/08/24 1904 07/09/24 0956   07/08/24 1500  cefTRIAXone  (ROCEPHIN ) 1 g in sodium chloride  0.9 % 100 mL IVPB  Status:  Discontinued        1 g 200 mL/hr over 30 Minutes Intravenous  Once 07/08/24 1455 07/08/24 1456        I have personally reviewed the following labs and images: CBC: No results for input(s): WBC, NEUTROABS, HGB, HCT, MCV, PLT in the last 168 hours.  BMP &GFR No results for input(s): NA, K, CL, CO2, GLUCOSE, BUN, CREATININE, CALCIUM , MG, PHOS in the last 168 hours.  Invalid input(s): GFRCG  Estimated Creatinine Clearance: 20.4 mL/min (A) (by C-G formula based on SCr of 1.84 mg/dL (H)). Liver & Pancreas: No results for input(s): AST, ALT, ALKPHOS, BILITOT, PROT, ALBUMIN  in the last 168 hours.  No results for input(s): LIPASE, AMYLASE in the last 168 hours.  No results for input(s): AMMONIA in the last 168 hours. Diabetic: No results for input(s): HGBA1C in the last 72 hours.  No results for input(s): GLUCAP in the last 168 hours.  Cardiac  Enzymes: No results for input(s): CKTOTAL, CKMB, CKMBINDEX, TROPONINI in the last 168 hours. Recent Labs    07/09/24 1323 07/10/24 0403  PROBNP >35,000.0* >35,000.0*   Coagulation Profile: No results for input(s): INR, PROTIME in the last 168 hours.  Thyroid  Function Tests: No results for input(s): TSH, T4TOTAL, FREET4, T3FREE, THYROIDAB in the last 72 hours. Lipid Profile: No results for input(s): CHOL, HDL, LDLCALC, TRIG, CHOLHDL, LDLDIRECT in the last 72 hours. Anemia Panel: No results for input(s): VITAMINB12, FOLATE, FERRITIN, TIBC, IRON, RETICCTPCT in the last 72 hours. Urine analysis:    Component Value Date/Time   COLORURINE YELLOW 07/08/2024 1113   APPEARANCEUR CLOUDY (A) 07/08/2024 1113   LABSPEC 1.012 07/08/2024 1113   PHURINE 8.0 07/08/2024 1113   GLUCOSEU NEGATIVE 07/08/2024 1113   HGBUR SMALL (A) 07/08/2024 1113   BILIRUBINUR NEGATIVE 07/08/2024 1113   BILIRUBINUR negative 08/15/2021 1537   KETONESUR NEGATIVE 07/08/2024 1113   PROTEINUR 100 (A) 07/08/2024 1113   UROBILINOGEN 0.2 08/15/2021 1537   UROBILINOGEN 1.0 03/26/2015 1441   NITRITE NEGATIVE 07/08/2024 1113   LEUKOCYTESUR LARGE (A) 07/08/2024 1113   Sepsis Labs: Invalid input(s): PROCALCITONIN, LACTICIDVEN  Microbiology: Recent Results (from the past 240 hours)  Urine Culture     Status: Abnormal   Collection Time: 07/08/24 11:13 AM   Specimen: Urine, Clean Catch  Result Value Ref Range Status   Specimen Description   Final    URINE, CLEAN CATCH Performed at [facility], [address] [place], [place] [address]    Special Requests   Final    NONE Performed at [facility], [address] [place], [place] [address]    Culture MULTIPLE SPECIES PRESENT, SUGGEST RECOLLECTION (A)  Final   Report Status 07/09/2024 FINAL  Final  Blood culture (routine x 2)     Status: None   Collection Time: 07/08/24  4:25 PM    Specimen: BLOOD  Result Value Ref Range Status   Specimen Description   Final    BLOOD PORTA CATH Performed at [facility], [address], [place], [place] [address]    Special Requests   Final    BOTTLES DRAWN AEROBIC AND ANAEROBIC Blood Culture adequate volume Performed at [facility], [address] [place], [place] [address]    Culture   Final    NO GROWTH 5 DAYS Performed at [facility], [address], [place], [place] [address]    Report Status 07/14/2024 FINAL  Final  Respiratory (~20 pathogens) panel by PCR     Status: None   Collection Time: 07/09/24  8:12 AM  Specimen: Nasopharyngeal Swab; Respiratory  Result Value Ref Range Status   Adenovirus NOT DETECTED NOT DETECTED Final   Coronavirus 229E NOT DETECTED NOT DETECTED Final    Comment: (NOTE) The Coronavirus on the Respiratory Panel, DOES NOT test for the novel  Coronavirus (2019 nCoV)    Coronavirus HKU1 NOT DETECTED NOT DETECTED Final   Coronavirus NL63 NOT DETECTED NOT DETECTED Final   Coronavirus OC43 NOT DETECTED NOT DETECTED Final   Metapneumovirus NOT DETECTED NOT DETECTED Final   Rhinovirus / Enterovirus NOT DETECTED NOT DETECTED Final   Influenza A NOT DETECTED NOT DETECTED Final   Influenza B NOT DETECTED NOT DETECTED Final   Parainfluenza Virus 1 NOT DETECTED NOT DETECTED Final   Parainfluenza Virus 2 NOT DETECTED NOT DETECTED Final   Parainfluenza Virus 3 NOT DETECTED NOT DETECTED Final   Parainfluenza Virus 4 NOT DETECTED NOT DETECTED Final   Respiratory Syncytial Virus NOT DETECTED NOT DETECTED Final   Bordetella pertussis NOT DETECTED NOT DETECTED Final   Bordetella Parapertussis NOT DETECTED NOT DETECTED Final   Chlamydophila pneumoniae NOT DETECTED NOT DETECTED Final   Mycoplasma pneumoniae NOT DETECTED NOT DETECTED Final    Comment: Performed at [facility], [address], [place], [place] [address]  Blood culture (routine x 2)      Status: None   Collection Time: 07/09/24  8:46 AM   Specimen: BLOOD  Result Value Ref Range Status   Specimen Description   Final    BLOOD SITE NOT SPECIFIED Performed at [facility], [address], [place], [place] [address]    Special Requests   Final    BOTTLES DRAWN AEROBIC ONLY Blood Culture results may not be optimal due to an inadequate volume of blood received in culture bottles Performed at [facility], [address], [place], [place] [address]    Culture   Final    NO GROWTH 5 DAYS Performed at [facility], [address] [place], [place] [address]    Report Status 07/14/2024 FINAL  Final  MRSA Next Gen by PCR, Nasal     Status: Abnormal   Collection Time: 07/09/24  9:28 AM   Specimen: Nasopharyngeal Swab; Nasal Swab  Result Value Ref Range Status   MRSA by PCR Next Gen DETECTED (A) NOT DETECTED Final    Comment: RESULT CALLED TO, READ BACK BY AND VERIFIED WITH:  GRIFFIN,C 07/09/2024 1143 AJ (NOTE) The GeneXpert MRSA Assay (FDA approved for NASAL specimens only), is one component of a comprehensive MRSA colonization surveillance program. It is not intended to diagnose MRSA infection nor to guide or monitor treatment for MRSA infections. Test performance is not FDA approved in patients less than 47 years old. Performed at [facility], [address], [place], [place] [address]   SARS Coronavirus 2 by RT PCR (hospital order, performed in [facility] hospital lab) *cepheid single result test* Anterior Nasal Swab     Status: None   Collection Time: 07/09/24  4:40 PM   Specimen: Anterior Nasal Swab  Result Value Ref Range Status   SARS Coronavirus 2 by RT PCR NEGATIVE NEGATIVE Final    Comment: (NOTE) SARS-CoV-2 target nucleic acids are NOT DETECTED.  The SARS-CoV-2 RNA is generally detectable in upper and lower respiratory specimens during the acute phase of infection. The lowest concentration of  SARS-CoV-2 viral copies this assay can detect is 250 copies / mL. A negative result does not preclude SARS-CoV-2 infection and  should not be used as the sole basis for treatment or other patient management decisions.  A negative result may occur with improper specimen collection / handling, submission of specimen other than nasopharyngeal swab, presence of viral mutation(s) within the areas targeted by this assay, and inadequate number of viral copies (<250 copies / mL). A negative result must be combined with clinical observations, patient history, and epidemiological information.  Fact Sheet for Patients:   roadlaptop.co.za  Fact Sheet for Healthcare Providers: http://kim-miller.com/  This test is not yet approved or  cleared by the [place]  FDA and has been authorized for detection and/or diagnosis of SARS-CoV-2 by FDA under an Emergency Use Authorization (EUA).  This EUA will remain in effect (meaning this test can be used) for the duration of the COVID-19 declaration under Section 564(b)(1) of the Act, 21 U.S.C. section 360bbb-3(b)(1), unless the authorization is terminated or revoked sooner.  Performed at [facility], [address] [place], [place] [address]     Radiology Studies: No results found.    Carmeron Heady T. Bronwyn Belasco [facility]  If 7PM-7AM, please contact night-coverage [email or web address] 07/17/2024, 11:49 AM

## 2024-07-17 NOTE — Progress Notes (Addendum)
 Palliative Medicine Inpatient Follow Up Note   HPI: Ms. [name] is a 68 yo female with bladder cancer with bone mets, chronic pain on methadone , HFrEF, NICM with AICD, and COPD.   Patient was transitioned to comfort-focused care last 07/12/2024 given patient's very poor prognosis related to multiorgan failure in the setting of cardiogenic shock.   PMT was consulted to provide support and get involved with symptom management for comfort care.    Today's Discussion 07/17/2024   Chart reviewed inclusive of vital signs, MAR and progress notes. Vitals from 07/16/2024 1810 revealed BP 90/63, pulse 122, and MAP 72.   Reviewed patient's MAR, hydromorphone  drip dose remains at 23mg /hour with last dilaudid  4mg  bolus administered on 07/17/2024 1021.    Visited patient at bedside. Found patient resting comfortably, lying in bed without any signs of distress. She is on 2LPM oxygen  via nasal cannula, breathing evenly, with no evidence of pain or discomfort. Extremities are warm to touch. The patient remains unresponsive.   Coordinated plan of care with bedside RN who reports patient is doing well, no significant change in status overnight.   The fiance who spent the night at bedside to allow other family members to rest, shared fond memories and happy times with the patient.   The importance of self care for all family members during this difficult time was gently reinforced. Current symptom management and comfort measures were discussed with the fiance, who was also educated on monitoring for any signs of pain or distress.   Update at 1045AM: Noted patient exhibiting signs mild discomfort, moving extremities and noted some involuntary jerking movements. Also heard increased gurgle sounds, indicating excessive pooling of oral secretions at posterior throat.  I shared with family that we will be ordering additional dose of Ativan  and robinul  for symptom control optimization.   Update at 3:30PM: I was  made aware by the bedside RN of the intermittent jerking and twitching episodes. I reached out to daughter [name] to update our  plan of care to address the  jerking/twitching episodes. Our plan is to start patient on lorazepam  drip in hopes to decrease/reduce myoclonic jerks and improve comfort.   Overall, goals of care remain unchanged, with a continued focus on comfort and anticipate of hospital death.   Created space and opportunity for patient to explore thoughts feelings and fears regarding current medical situation.  Questions and concerns addressed.  Palliative Support Provided.   Objective Assessment: Vital Signs Vitals:   07/16/24 1228 07/16/24 1810  BP: 94/60 90/63  Pulse: (!) 120 (!) 122  Resp: (!) 26   Temp:  99 F (37.2 C)  SpO2: 92% 96%    Intake/Output Summary (Last 24 hours) at 07/17/2024 0800 Last data filed at 07/16/2024 1835 Gross per 24 hour  Intake --  Output 100 ml  Net -100 ml   Last Weight  Most recent update: 07/08/2024  5:19 PM    Weight  54.4 kg (120 lb)             Gen:  She appear comfortable, not in distress.  HEENT: moist mucous membrane.  CV: HR 122 07/16/24 1810 PULM: Breathing evenly, noted for some periods of long pauses ABD: soft/nontender/nondistended/normal bowel sounds EXT: No edema Neuro: Unresponsive  SUMMARY OF RECOMMENDATIONS   Code Status: Transition to DNR-Comfort Anticipate hospital death Continue to provide psycho-social and emotional support to patient and family Palliative medicine team will continue to follow.  Provide comfort cart for the family. Unrestricted visitations in  the setting of EOL (per policy)  Symptom Management:  Continue with hydromorphone  infusion for pain/air hunger/comfortm consider dose titration if needed Continue with scheduled Robinul   for excessive secretions Continue with scheduled Ativan  for agitation/anxiety One time dose of Ativan  and Robinul .  Discontinue scheduled Lorazepam ,  start Lorazepam  infusion at 1mg  per hour, titratable up to 10mg  for jerking/twitching.  Consider titrating down the hydromorphone  dose Continue with PRN versed  for persistent discomfort.  May have comfort feeding Oxygen  PRN 2L or less for comfort. No escalation.    I personally spent a total of 35 minutes in the care of the patient today including preparing to see the patient, getting/reviewing separately obtained history, performing a medically appropriate exam/evaluation, counseling and educating, referring and communicating with other health care professionals, documenting clinical information in the EHR, and coordinating care.  ___________________________________________________________________ Kathlyne Bolder NP-C [place] Palliative Medicine Team Team Cell Phone: [phone] Please utilize secure chat with additional questions, if there is no response within 30 minutes please call the above phone number  Palliative Medicine Team providers are available by phone from 7am to 7pm daily and can be reached through the team cell phone.  Should this patient require assistance outside of these hours, please call the patient's attending physician.

## 2024-07-17 NOTE — Plan of Care (Signed)
  Problem: Activity: Goal: Ability to tolerate increased activity will improve Outcome: Progressing   Problem: Respiratory: Goal: Ability to maintain a clear airway and adequate ventilation will improve Outcome: Progressing   Problem: Role Relationship: Goal: Method of communication will improve Outcome: Progressing   Problem: Education: Goal: Knowledge of General Education information will improve Description: Including pain rating scale, medication(s)/side effects and non-pharmacologic comfort measures Outcome: Progressing   Problem: Health Behavior/Discharge Planning: Goal: Ability to manage health-related needs will improve Outcome: Progressing   Problem: Clinical Measurements: Goal: Ability to maintain clinical measurements within normal limits will improve Outcome: Progressing Goal: Will remain free from infection Outcome: Progressing Goal: Diagnostic test results will improve Outcome: Progressing Goal: Respiratory complications will improve Outcome: Progressing Goal: Cardiovascular complication will be avoided Outcome: Progressing   Problem: Activity: Goal: Risk for activity intolerance will decrease Outcome: Progressing   Problem: Nutrition: Goal: Adequate nutrition will be maintained Outcome: Progressing   Problem: Coping: Goal: Level of anxiety will decrease Outcome: Progressing   Problem: Elimination: Goal: Will not experience complications related to bowel motility Outcome: Progressing Goal: Will not experience complications related to urinary retention Outcome: Progressing   Problem: Pain Managment: Goal: General experience of comfort will improve and/or be controlled Outcome: Progressing   Problem: Safety: Goal: Ability to remain free from injury will improve Outcome: Progressing   Problem: Skin Integrity: Goal: Risk for impaired skin integrity will decrease Outcome: Progressing   Problem: Education: Goal: Ability to describe self-care  measures that may prevent or decrease complications (Diabetes Survival Skills Education) will improve Outcome: Progressing Goal: Individualized Educational Video(s) Outcome: Progressing   Problem: Coping: Goal: Ability to adjust to condition or change in health will improve Outcome: Progressing   Problem: Fluid Volume: Goal: Ability to maintain a balanced intake and output will improve Outcome: Progressing   Problem: Health Behavior/Discharge Planning: Goal: Ability to identify and utilize available resources and services will improve Outcome: Progressing Goal: Ability to manage health-related needs will improve Outcome: Progressing   Problem: Metabolic: Goal: Ability to maintain appropriate glucose levels will improve Outcome: Progressing   Problem: Nutritional: Goal: Maintenance of adequate nutrition will improve Outcome: Progressing Goal: Progress toward achieving an optimal weight will improve Outcome: Progressing   Problem: Skin Integrity: Goal: Risk for impaired skin integrity will decrease Outcome: Progressing   Problem: Tissue Perfusion: Goal: Adequacy of tissue perfusion will improve Outcome: Progressing

## 2024-07-17 NOTE — Plan of Care (Signed)
   Problem: Education: Goal: Knowledge of General Education information will improve Description: Including pain rating scale, medication(s)/side effects and non-pharmacologic comfort measures Outcome: Progressing   Problem: Pain Managment: Goal: General experience of comfort will improve and/or be controlled Outcome: Progressing   Problem: Safety: Goal: Ability to remain free from injury will improve Outcome: Progressing

## 2024-07-18 DIAGNOSIS — Z515 Encounter for palliative care: Secondary | ICD-10-CM | POA: Diagnosis not present

## 2024-07-18 NOTE — Assessment & Plan Note (Signed)
 Thought to be prerenal due to shock state Transitioned to comfort care

## 2024-07-18 NOTE — Hospital Course (Signed)
 68yo with h/o stage 4 bladder cancer with bony metastasis, chronic pain syndrome, chronic HFrEF s/o AICD, and COPD who presented on 11/13 with n/v/d.  CT with pancolitis, given IV and antibiotics.  On 11/14, she became obtunded with hematemesis and was intubated for mixed cardiogenic and septic shock.  Lactate >8, pro-BNP >35000, troponin 486, WBC 20.6.  Echo showed EF <20%.  She was eventually transitioned to comfort care on 11/15 and is on Dilaudid  infusion.  Palliative care is consulting.  Anticipate in-hospital death.

## 2024-07-18 NOTE — Assessment & Plan Note (Addendum)
 No clear source of infection other than pancolitis Surprisingly, she had no fever or leukocytosis on admission Blood cultures NGTD Urine culture with multiple species Initially started on ceftriaxone  and Flagyl  but antibiotics were stopped with transition to comfort care

## 2024-07-18 NOTE — Progress Notes (Signed)
 Progress Note   Patient: [name] FMW:[id number] DOB: 13-Sep-1955 DOA: 07/08/2024     10 DOS: the patient was seen and examined on 07/18/2024   Brief hospital course: 68yo with h/o stage 4 bladder cancer with bony metastasis, chronic pain syndrome, chronic HFrEF s/o AICD, and COPD who presented on 11/13 with n/v/d.  CT with pancolitis, given IV and antibiotics.  On 11/14, she became obtunded with hematemesis and was intubated for mixed cardiogenic and septic shock.  Lactate >8, pro-BNP >35000, troponin 486, WBC 20.6.  Echo showed EF <20%.  She was eventually transitioned to comfort care on 11/15 and is on Dilaudid  infusion.  Palliative care is consulting.  Anticipate in-hospital death.   Assessment & Plan Comfort measures only status End of life care DNR (do not resuscitate) Patient developed multiorgan failure in the setting of mixed cardiogenic and shock Comfort care initiated on 11/15 while in ICU Palliative medicine consulted for assistance with end-of-life care On Dilaudid  infusion with bolus Concern for seizure activity on 11/22 so now also on Ativan  drip Anticipate in hospital death although family will be offered the option of transitioning to residential hospice (prefer [facility])  Acute on chronic systolic heart failure (HCC) Cardiogenic shock (HCC) TTE with LVEF of < 20% and RWMA (new); prior echo in 10/2022 with LVEF of 55 to 60% and G1 DD proBNP > 35,000. Troponin 486 >> 655 Patient received 2 L fluid boluses the night of admission and was on maintenance IVF Septic shock (HCC) Pancolitis Shock liver Lactic acidosis No clear source of infection other than pancolitis Surprisingly, she had no fever or leukocytosis on admission Blood cultures NGTD Urine culture with multiple species Initially started on ceftriaxone  and Flagyl  but antibiotics were stopped with transition to comfort care AKI (acute kidney injury) Thought to be prerenal due to shock  state Transitioned to comfort care Protein-calorie malnutrition, severe Nutrition Problem: Severe Malnutrition Etiology: chronic illness (bladder cancer with bone mets) Signs/Symptoms: moderate fat depletion, severe muscle depletion, percent weight loss (21% in 9 months) Percent weight loss: 21 % (in 9 months) Interventions: Refer to RD note for recommendations Patient transitioned to comfort measures  History of bladder cancer Indwelling R ureteral stent placed in 11/2023 On chemotherapy at time of presentation and appeared to be responding to treatment Urology was consulted on presentation No acute urologic intervention was indicated at that time      Consultants: [facility] Urology Cardiology Oncology Palliative care Dietician  Procedures: Urology-inserted foley catheter 11/14 Arterial catheter placement 11/14 CVC insertion 11/14 Intubation 11/14-15  Antibiotics: Azithromycin  x 2 Ceftriaxone  x 1 Metronidazole  11/13-14 Zosyn  11/14-15 Vancomycin  11/14-15  30 Day Unplanned Readmission Risk Score    Flowsheet Row ED to Hosp-Admission (Current) from 07/08/2024 in [facility]  30 Day Unplanned Readmission Risk Score (%) 25.93 Filed at 07/18/2024 0801    This score is the patient's risk of an unplanned readmission within 30 days of being discharged (0 -100%). The score is based on dignosis, age, lab data, medications, orders, and past utilization.   Low:  0-14.9   Medium: 15-21.9   High: 22-29.9   Extreme: 30 and above           Subjective: Obtunded.  Daughter reports concern for seizure activity yesterday, now on Ativan  + Dilaudid  drips.   Objective: Vitals:   07/16/24 1810 07/17/24 2012  BP: 90/63 (!) 86/57  Pulse: (!) 122 (!) 126  Resp:  16  Temp: 99 F (37.2  C) 100.1 F (37.8 C)  SpO2: 96% 100%    Intake/Output Summary (Last 24 hours) at 07/18/2024 1342 Last data filed at 07/17/2024 2151 Gross per 24 hour   Intake --  Output 100 ml  Net -100 ml   Filed Weights   07/08/24 1719  Weight: 54.4 kg    Exam:  General:  Obtunded, mildly agonal breathing noted Eyes:  normal lids, closed throughout ENT:  dry mucosa with mouth breathing, debris present in mouth (oral care encouraged by nursing) Cardiovascular:  RRR.B hand edema.  Respiratory:   Scattered rhonchi.  Irregular respiratory effort. Abdomen:  soft, NT, ND Skin:  no rash or induration seen on limited exam other than weeping/edema of B hands and forearms Musculoskeletal:  no bony abnormality Psychiatric: obtunded Neurologic:  unable to effectively perform  Data Reviewed: I have reviewed the patient's lab results since admission.  Pertinent labs for today include:   None     Family Communication: Daughter, son, his wife, and grandson were all present     Code Status: Do not attempt resuscitation (DNR) - Comfort care  Disposition: Status is: Inpatient Remains inpatient appropriate because: anticipate in-hospital demise vs. Transfer to residential hospice     Time spent: 50 minutes  Unresulted Labs (From admission, onward)    None        Author: Delon Herald, MD 07/18/2024 1:42 PM  For on call review [email or web address].

## 2024-07-18 NOTE — Assessment & Plan Note (Addendum)
 Patient developed multiorgan failure in the setting of mixed cardiogenic and shock Comfort care initiated on 11/15 while in ICU Palliative medicine consulted for assistance with end-of-life care On Dilaudid  infusion with bolus Concern for seizure activity on 11/22 so now also on Ativan  drip Anticipate in hospital death although family will be offered the option of transitioning to residential hospice (prefer [facility])

## 2024-07-18 NOTE — Assessment & Plan Note (Addendum)
 TTE with LVEF of < 20% and RWMA (new); prior echo in 10/2022 with LVEF of 55 to 60% and G1 DD proBNP > 35,000. Troponin 486 >> 655 Patient received 2 L fluid boluses the night of admission and was on maintenance IVF

## 2024-07-18 NOTE — Assessment & Plan Note (Signed)
 Nutrition Problem: Severe Malnutrition Etiology: chronic illness (bladder cancer with bone mets) Signs/Symptoms: moderate fat depletion, severe muscle depletion, percent weight loss (21% in 9 months) Percent weight loss: 21 % (in 9 months) Interventions: Refer to RD note for recommendations Patient transitioned to comfort measures

## 2024-07-18 NOTE — Plan of Care (Signed)
  Problem: Activity: Goal: Ability to tolerate increased activity will improve Outcome: Progressing   Problem: Respiratory: Goal: Ability to maintain a clear airway and adequate ventilation will improve Outcome: Progressing   Problem: Role Relationship: Goal: Method of communication will improve Outcome: Progressing   Problem: Education: Goal: Knowledge of General Education information will improve Description: Including pain rating scale, medication(s)/side effects and non-pharmacologic comfort measures Outcome: Progressing   Problem: Health Behavior/Discharge Planning: Goal: Ability to manage health-related needs will improve Outcome: Progressing   Problem: Clinical Measurements: Goal: Ability to maintain clinical measurements within normal limits will improve Outcome: Progressing Goal: Will remain free from infection Outcome: Progressing Goal: Diagnostic test results will improve Outcome: Progressing Goal: Respiratory complications will improve Outcome: Progressing Goal: Cardiovascular complication will be avoided Outcome: Progressing   Problem: Activity: Goal: Risk for activity intolerance will decrease Outcome: Progressing   Problem: Nutrition: Goal: Adequate nutrition will be maintained Outcome: Progressing   Problem: Coping: Goal: Level of anxiety will decrease Outcome: Progressing   Problem: Elimination: Goal: Will not experience complications related to bowel motility Outcome: Progressing Goal: Will not experience complications related to urinary retention Outcome: Progressing   Problem: Pain Managment: Goal: General experience of comfort will improve and/or be controlled Outcome: Progressing   Problem: Safety: Goal: Ability to remain free from injury will improve Outcome: Progressing   Problem: Skin Integrity: Goal: Risk for impaired skin integrity will decrease Outcome: Progressing   Problem: Education: Goal: Ability to describe self-care  measures that may prevent or decrease complications (Diabetes Survival Skills Education) will improve Outcome: Progressing Goal: Individualized Educational Video(s) Outcome: Progressing   Problem: Coping: Goal: Ability to adjust to condition or change in health will improve Outcome: Progressing   Problem: Fluid Volume: Goal: Ability to maintain a balanced intake and output will improve Outcome: Progressing   Problem: Health Behavior/Discharge Planning: Goal: Ability to identify and utilize available resources and services will improve Outcome: Progressing Goal: Ability to manage health-related needs will improve Outcome: Progressing   Problem: Metabolic: Goal: Ability to maintain appropriate glucose levels will improve Outcome: Progressing   Problem: Nutritional: Goal: Maintenance of adequate nutrition will improve Outcome: Progressing Goal: Progress toward achieving an optimal weight will improve Outcome: Progressing   Problem: Skin Integrity: Goal: Risk for impaired skin integrity will decrease Outcome: Progressing   Problem: Tissue Perfusion: Goal: Adequacy of tissue perfusion will improve Outcome: Progressing

## 2024-07-18 NOTE — Progress Notes (Addendum)
 Palliative Medicine Inpatient Follow Up Note   HPI: Ms. [name] is a 68 yo female with bladder cancer with bone mets, chronic pain on methadone , HFrEF, NICM with AICD, and COPD.   Patient was transitioned to comfort-focused care last 07/12/2024 given patient's very poor prognosis related to multiorgan failure in the setting of cardiogenic shock.   PMT was consulted to provide support and get involved with symptom management for comfort care.    Today's Discussion 07/18/2024   Chart reviewed inclusive of MAR and progress notes. Reveiwed MAR record over the last 24 hours, maintains on comfort medication, dilaudid  drip at 25mg /hr, and Lorazepam  drip at 2mg /hr.  Visited patient at bedside. Family member present at bedside. Patient appears resting comfortably, not in any signs of acute distress. Did not observe for any involuntary jerking movements. Her breathing appears even. Heard gurgly sounds at the back of her throat. Remains unresponsive to external stimuli. Extremities warm. End-of-life measures in place.   I discussed and updated patient's son the current plan of care.  I shared that yesterday we placed patient on lorazepam  drip to enhance comforts in the setting of new onset intermittent jerks and twitching. I discussed that this can mean several things like increasing discomfort/pain, or can sometimes be a side effect of opiates. I shared that our focus is to continue optimizing comfort. I shared that we may adjust/increase the lorazepam  drip as appropriate, and also will plan to decrease the dose of dilaudid  as tolerated if intermittent jerking is observed.    Family shared that patient has had a not so good day yesterday, but became comfortable overnight.   Coordinated plan of care with bedside RN who reports patient is doing well, no significant change in status overnight, patient does not exhibit signs of distress and family members present at bedside 24/7.   Overall, goals of care  remain unchanged, with a continued focus on comfort and anticipate of hospital death.   Created space and opportunity for patient to explore thoughts feelings and fears regarding current medical situation.  Questions and concerns addressed.  Palliative Support Provided.   Objective Assessment: Vital Signs Vitals:   07/16/24 1810 07/17/24 2012  BP: 90/63 (!) 86/57  Pulse: (!) 122 (!) 126  Resp:  16  Temp: 99 F (37.2 C) 100.1 F (37.8 C)  SpO2: 96% 100%    Intake/Output Summary (Last 24 hours) at 07/18/2024 9180 Last data filed at 07/17/2024 2151 Gross per 24 hour  Intake --  Output 100 ml  Net -100 ml   Last Weight  Most recent update: 07/08/2024  5:19 PM    Weight  54.4 kg (120 lb)             Gen:  She appear comfortable, not in distress, no involuntary jerking of extremities observed. HEENT: moist mucous membrane.  CV: HR 122 07/16/24 1810 PULM: Breathing spontaneous and evenly, noted for some periods of long pauses ABD: soft/nontender/nondistended/normal bowel sounds EXT: Mild swelling, warm to touch Neuro: Unresponsive to external stimuli  SUMMARY OF RECOMMENDATIONS   Code Status: Transition to DNR-Comfort Anticipate hospital death Continue to provide psycho-social and emotional support to patient and family Palliative medicine team will continue to follow.  Provide comfort cart for the family. Unrestricted visitations in the setting of EOL (per policy)  Symptom Management:  Continue with hydromorphone  infusion for pain/air hunger/comfortm consider dose titration if needed Continue with scheduled Robinul   for excessive secretions Continue with scheduled Ativan  for agitation/anxiety One time dose  of Ativan  and Robinul .  Continue with Lorazepam  drip at 2 mg/hr. Patient appears comfortable. Consider titrating dose higher as appropriate. Consider titrating down the hydromorphone  dose if involuntary twitching and jerking movements persist. Continue with  PRN versed  for persistent discomfort.  May have comfort feeding Oxygen  PRN 2L or less for comfort. No escalation.    I personally spent a total of 35 minutes in the care of the patient today including preparing to see the patient, getting/reviewing separately obtained history, performing a medically appropriate exam/evaluation, counseling and educating, referring and communicating with other health care professionals, documenting clinical information in the EHR, and coordinating care.  ________________________________________ Kathlyne Bolder NP-C [place] Palliative Medicine Team Team Cell Phone: [phone] Please utilize secure chat with additional questions, if there is no response within 30 minutes please call the above phone number  Palliative Medicine Team providers are available by phone from 7am to 7pm daily and can be reached through the team cell phone.  Should this patient require assistance outside of these hours, please call the patient's attending physician.

## 2024-07-18 NOTE — Assessment & Plan Note (Signed)
 Indwelling R ureteral stent placed in 11/2023 On chemotherapy at time of presentation and appeared to be responding to treatment Urology was consulted on presentation No acute urologic intervention was indicated at that time

## 2024-07-19 ENCOUNTER — Inpatient Hospital Stay

## 2024-07-19 DIAGNOSIS — Z515 Encounter for palliative care: Secondary | ICD-10-CM | POA: Diagnosis not present

## 2024-07-19 DIAGNOSIS — Z7189 Other specified counseling: Secondary | ICD-10-CM | POA: Diagnosis not present

## 2024-07-19 DIAGNOSIS — Z66 Do not resuscitate: Secondary | ICD-10-CM | POA: Diagnosis not present

## 2024-07-19 DIAGNOSIS — Z789 Other specified health status: Secondary | ICD-10-CM | POA: Diagnosis not present

## 2024-07-19 MED ORDER — SODIUM CHLORIDE 0.9 % IV SOLN
6.0000 mg/h | INTRAVENOUS | Status: DC
  Administered 2024-07-19 (×2): 25 mg/h via INTRAVENOUS
  Filled 2024-07-19: qty 10
  Filled 2024-07-19: qty 20

## 2024-07-19 MED ORDER — LORAZEPAM BOLUS VIA INFUSION
2.0000 mg | INTRAVENOUS | Status: DC | PRN
Start: 2024-07-19 — End: 2024-07-20
  Administered 2024-07-19 – 2024-07-20 (×10): 2 mg via INTRAVENOUS

## 2024-07-19 MED ORDER — SODIUM CHLORIDE 0.9 % IV SOLN
6.0000 mg/h | INTRAVENOUS | Status: DC
  Filled 2024-07-19: qty 10

## 2024-07-19 MED ORDER — FENTANYL 2500MCG IN NS 250ML (10MCG/ML) PREMIX INFUSION
500.0000 ug/h | INTRAVENOUS | Status: DC
  Administered 2024-07-19 – 2024-07-20 (×7): 500 ug/h via INTRAVENOUS
  Administered 2024-07-21 – 2024-07-22 (×7): 550 ug/h via INTRAVENOUS
  Filled 2024-07-19 (×14): qty 250

## 2024-07-19 MED ORDER — FENTANYL BOLUS VIA INFUSION
200.0000 ug | INTRAVENOUS | Status: DC | PRN
  Administered 2024-07-20 – 2024-07-21 (×4): 200 ug via INTRAVENOUS

## 2024-07-19 NOTE — Assessment & Plan Note (Signed)
 Indwelling R ureteral stent placed in 11/2023 On chemotherapy at time of presentation and appeared to be responding to treatment Urology was consulted on presentation No acute urologic intervention was indicated at that time

## 2024-07-19 NOTE — Assessment & Plan Note (Addendum)
 Patient developed multiorgan failure in the setting of mixed cardiogenic and shock Comfort care initiated on 11/15 while in ICU Palliative medicine consulted for assistance with end-of-life care On Dilaudid  infusion with bolus Concern for seizure activity on 11/22 so now also on Ativan  drip Per daughter, she has had 5 episodes in the last 24 hours; witnessed by me this AM and c/w seizure Concern that this may be associated with Dilaudid  metabolites in the setting of renal failure; will attempt to transition to fentanyl  Continue Ativan  infusion with boluses prn seizure activity She does not appear to be stable enough for transfer to residential hospice, particularly in light of the high-dose opiate infusion and BZD infusion with seizure activity at this time Anticipate in hospital death

## 2024-07-19 NOTE — Assessment & Plan Note (Signed)
 No clear source of infection other than pancolitis Surprisingly, she had no fever or leukocytosis on admission Blood cultures NGTD Urine culture with multiple species Initially started on ceftriaxone  and Flagyl  but antibiotics were stopped with transition to comfort care

## 2024-07-19 NOTE — Plan of Care (Signed)
  Problem: Activity: Goal: Ability to tolerate increased activity will improve Outcome: Progressing   Problem: Respiratory: Goal: Ability to maintain a clear airway and adequate ventilation will improve Outcome: Progressing   Problem: Role Relationship: Goal: Method of communication will improve Outcome: Progressing   Problem: Education: Goal: Knowledge of General Education information will improve Description: Including pain rating scale, medication(s)/side effects and non-pharmacologic comfort measures Outcome: Progressing   Problem: Health Behavior/Discharge Planning: Goal: Ability to manage health-related needs will improve Outcome: Progressing   Problem: Clinical Measurements: Goal: Ability to maintain clinical measurements within normal limits will improve Outcome: Progressing Goal: Will remain free from infection Outcome: Progressing Goal: Diagnostic test results will improve Outcome: Progressing Goal: Respiratory complications will improve Outcome: Progressing Goal: Cardiovascular complication will be avoided Outcome: Progressing   Problem: Activity: Goal: Risk for activity intolerance will decrease Outcome: Progressing   Problem: Nutrition: Goal: Adequate nutrition will be maintained Outcome: Progressing   Problem: Coping: Goal: Level of anxiety will decrease Outcome: Progressing   Problem: Elimination: Goal: Will not experience complications related to bowel motility Outcome: Progressing Goal: Will not experience complications related to urinary retention Outcome: Progressing   Problem: Pain Managment: Goal: General experience of comfort will improve and/or be controlled Outcome: Progressing   Problem: Safety: Goal: Ability to remain free from injury will improve Outcome: Progressing   Problem: Skin Integrity: Goal: Risk for impaired skin integrity will decrease Outcome: Progressing   Problem: Education: Goal: Ability to describe self-care  measures that may prevent or decrease complications (Diabetes Survival Skills Education) will improve Outcome: Progressing Goal: Individualized Educational Video(s) Outcome: Progressing   Problem: Coping: Goal: Ability to adjust to condition or change in health will improve Outcome: Progressing   Problem: Fluid Volume: Goal: Ability to maintain a balanced intake and output will improve Outcome: Progressing   Problem: Health Behavior/Discharge Planning: Goal: Ability to identify and utilize available resources and services will improve Outcome: Progressing Goal: Ability to manage health-related needs will improve Outcome: Progressing   Problem: Metabolic: Goal: Ability to maintain appropriate glucose levels will improve Outcome: Progressing   Problem: Nutritional: Goal: Maintenance of adequate nutrition will improve Outcome: Progressing Goal: Progress toward achieving an optimal weight will improve Outcome: Progressing   Problem: Skin Integrity: Goal: Risk for impaired skin integrity will decrease Outcome: Progressing   Problem: Tissue Perfusion: Goal: Adequacy of tissue perfusion will improve Outcome: Progressing

## 2024-07-19 NOTE — Assessment & Plan Note (Addendum)
 TTE with LVEF of < 20% and RWMA (new); prior echo in 10/2022 with LVEF of 55 to 60% and G1 DD proBNP > 35,000. Troponin 486 >> 655 Patient received 2 L fluid boluses the night of admission and was on maintenance IVF Transitioned to comfort care

## 2024-07-19 NOTE — Progress Notes (Signed)
 Progress Note   Patient: [name] FMW:[id number] DOB: 04-15-1956 DOA: 07/08/2024     11 DOS: the patient was seen and examined on 07/19/2024   Brief hospital course: 68yo with h/o stage 4 bladder cancer with bony metastasis, chronic pain syndrome, chronic HFrEF s/o AICD, and COPD who presented on 11/13 with n/v/d.  CT with pancolitis, given IV and antibiotics.  On 11/14, she became obtunded with hematemesis and was intubated for mixed cardiogenic and septic shock.  Lactate >8, pro-BNP >35000, troponin 486, WBC 20.6.  Echo showed EF <20%.  She was eventually transitioned to comfort care on 11/15 and is on Dilaudid  infusion.  Palliative care is consulting.  Anticipate in-hospital death.   Assessment & Plan Comfort measures only status End of life care DNR (do not resuscitate) Patient developed multiorgan failure in the setting of mixed cardiogenic and shock Comfort care initiated on 11/15 while in ICU Palliative medicine consulted for assistance with end-of-life care On Dilaudid  infusion with bolus Concern for seizure activity on 11/22 so now also on Ativan  drip Per daughter, she has had 5 episodes in the last 24 hours; witnessed by me this AM and c/w seizure Concern that this may be associated with Dilaudid  metabolites in the setting of renal failure; will attempt to transition to fentanyl  Continue Ativan  infusion with boluses prn seizure activity She does not appear to be stable enough for transfer to residential hospice, particularly in light of the high-dose opiate infusion and BZD infusion with seizure activity at this time Anticipate in hospital death  Acute on chronic systolic heart failure (HCC) Cardiogenic shock (HCC) TTE with LVEF of < 20% and RWMA (new); prior echo in 10/2022 with LVEF of 55 to 60% and G1 DD proBNP > 35,000. Troponin 486 >> 655 Patient received 2 L fluid boluses the night of admission and was on maintenance IVF Transitioned to comfort care Septic shock  (HCC) Pancolitis Shock liver Lactic acidosis No clear source of infection other than pancolitis Surprisingly, she had no fever or leukocytosis on admission Blood cultures NGTD Urine culture with multiple species Initially started on ceftriaxone  and Flagyl  but antibiotics were stopped with transition to comfort care AKI (acute kidney injury) Thought to be prerenal due to shock state Transitioned to comfort care Protein-calorie malnutrition, severe Nutrition Problem: Severe Malnutrition Etiology: chronic illness (bladder cancer with bone mets) Signs/Symptoms: moderate fat depletion, severe muscle depletion, percent weight loss (21% in 9 months) Percent weight loss: 21 % (in 9 months) Interventions: Refer to RD note for recommendations Patient transitioned to comfort measures  History of bladder cancer Indwelling R ureteral stent placed in 11/2023 On chemotherapy at time of presentation and appeared to be responding to treatment Urology was consulted on presentation No acute urologic intervention was indicated at that time      Consultants: [facility] Urology Cardiology Oncology Palliative care Hospice Dietician   Procedures: Urology-inserted foley catheter 11/14 Arterial catheter placement 11/14 CVC insertion 11/14 Intubation 11/14-15   Antibiotics: Azithromycin  x 2 Ceftriaxone  x 1 Metronidazole  11/13-14 Zosyn  11/14-15 Vancomycin  11/14-15  30 Day Unplanned Readmission Risk Score    Flowsheet Row ED to Hosp-Admission (Current) from 07/08/2024 in [facility]  30 Day Unplanned Readmission Risk Score (%) 25.97 Filed at 07/19/2024 0401    This score is the patient's risk of an unplanned readmission within 30 days of being discharged (0 -100%). The score is based on dignosis, age, lab data, medications, orders, and past utilization.   Low:  0-14.9   Medium: 15-21.9   High: 22-29.9   Extreme: 30 and above           Subjective:  Seizure activity noted when I entered the room and confirmed by her daughter - GTC activity with repetitive blinking and twitching.  She was agonal with dusky skin tone following the event and then settled with recurrent irregular breathing pattern.   Objective: Vitals:   07/17/24 2012 07/19/24 0600  BP: (!) 86/57   Pulse: (!) 126   Resp: 16 (!) 22  Temp: 100.1 F (37.8 C)   SpO2: 100%     Intake/Output Summary (Last 24 hours) at 07/19/2024 9247 Last data filed at 07/18/2024 1700 Gross per 24 hour  Intake --  Output 100 ml  Net -100 ml   Filed Weights   07/08/24 1719  Weight: 54.4 kg    Exam:  General:  Seizure-like activity noted, then dusky, finally stabilized and comfortable-appearing Eyes:  normal lids, closed throughout ENT:  dry mucosa with debris/sediment in mouth Cardiovascular:  RR with tachycardia. B hand edema.  Respiratory:   Coarse breath sounds, agonal breathing Abdomen:  soft, ND Skin:  no rash or induration seen on limited exam Musculoskeletal:   no bony abnormality Psychiatric: obtunded Neurologic:  unable to effectively perform  Data Reviewed: I have reviewed the patient's lab results since admission.  Pertinent labs for today include:  None      Family Communication: Daughter and grandson were present      Code Status: Do not attempt resuscitation (DNR) - Comfort care   Disposition: Status is: Inpatient Remains inpatient appropriate because: anticipate in-hospital death     Time spent: 50 minutes  Unresulted Labs (From admission, onward)    None        Author: Delon Herald, MD 07/19/2024 7:52 AM  For on call review [email or web address].

## 2024-07-19 NOTE — Assessment & Plan Note (Signed)
 Thought to be prerenal due to shock state Transitioned to comfort care

## 2024-07-19 NOTE — Progress Notes (Signed)
   07/19/24 1555  Spiritual Encounters  Type of Visit Follow up  Care provided to: Family  Referral source Chaplain assessment  Reason for visit End-of-life  OnCall Visit No   I greeted [name]' family and inquired about their well being, realizing this has been a long end-of-life journey. [name] herself did seem peaceful but has some seizure-like movement and chest rattle.  I affirmed family for their support and love, remaining present but acknowledging how hard that can be. We spoke of the possibility for some to pass on only when we leave. I offered normalization of all these emotions. I prayed silently while their RN checked on the seizure symptoms. I offered prayer out loud with family consent. I offered my wishes for [name] comfort and peaceful transition.  [name] HERO.Div

## 2024-07-19 NOTE — Progress Notes (Signed)
 Daily Progress Note   Date: 07/19/2024   Patient Name: [name]  DOB: 06-Oct-1955  MRN: [id number]  Age / Sex: 68 y.o., female  Attending Physician: Barbarann Nest, MD Primary Care Physician: Teressa Harrie HERO, FNP Admit Date: 07/08/2024 Length of Stay: 11 days  Reason for Follow-up: Establishing goals of care  Past Medical History:  Diagnosis Date   Abnormality of gait due to impairment of balance 11/22/2020   Admission for long-term opiate analgesic use 10/24/2019   AKI (acute kidney injury) 11/14/2023   Anemia of chronic disease 06/07/2023   Arthralgia of both hands 04/07/2023   Arthritis of right hip 05/29/2016   Formatting of this note might be different from the original. Added automatically from request for surgery [id number]   Arthritis of right knee 11/27/2016   At risk for side effect of medication 12/18/2023   Bilateral lower extremity edema 06/07/2023   BMI 26.0-26.9,adult 03/29/2020   Bone lesion 11/18/2023   Cardiomyopathy (HCC)    Overview:  Ejection fraction 45% in 2015 Ejection fraction 30 to 35% in November 2018   Chronic active hepatitis (HCC) 01/04/2023   Chronic back pain    Chronic hypoxemic respiratory failure (HCC) 10/24/2019   Chronic narcotic use 03/23/2015   Chronic pain of both knees 12/21/2018   Added automatically from request for surgery [id number]  Formatting of this note might be different from the original. 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   Dyslipidemia 05/31/2015   Elevated serum glucose 06/23/2023   Fatigue 06/23/2023   Folate deficiency 01/08/2024   GERD (gastroesophageal reflux disease)    History of total hip replacement 09/25/2016   Hypoaldosteronism 07/13/2020   Hypotension 05/17/2020   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   Ileus following gastrointestinal surgery (HCC) 03/26/2015   Iron deficiency 11/21/2023   Low vitamin B12 level 11/21/2023   Major depressive disorder, single episode, moderate (HCC) 10/24/2019   Malnutrition of moderate degree 10/20/2020   Mesenteric ischemia 10/16/2020   Migraine without aura and without status migrainosus, not intractable 01/04/2023   Mixed hyperlipidemia 05/31/2015   Mixed incontinence 10/20/2020   Moderate protein-calorie malnutrition 11/03/2023   Nausea with vomiting    Nausea without vomiting 01/29/2024   Nephrostomy present (HCC) 12/01/2023   Neuropathy 01/29/2024   Normocytic anemia 06/23/2023   Osteoporosis 10/24/2019   Other spondylosis with radiculopathy, lumbar region 10/24/2019   Paraplegia (HCC) 11/14/2023   Presence of left artificial hip joint 10/24/2019   Primary insomnia 01/04/2023   PTSD (post-traumatic stress disorder)    Renal lesion 11/05/2023   Rheumatoid arthritis involving multiple sites (HCC) 04/07/2023   Senile osteoporosis 10/24/2019   Serosanguineous chronic otitis media of right ear 08/02/2021  Therapeutic opioid induced constipation 01/04/2023   Thyroid  disease    Urge incontinence of urine 11/03/2023   Urothelial carcinoma of bladder (HCC)  11/18/2023   Vertebral fracture, pathological 12/01/2023   Vitamin D  deficiency 09/25/2023    Subjective:   Subjective: Chart Reviewed. Updates received. Patient Assessed. Created space and opportunity for patient  and family to explore thoughts and feelings regarding current medical situation.  Today's Discussion: Today before meeting with the patient/family, I reviewed the chart notes including previous palliative note from yesterday, internal medicine note from yesterday, internal medicine note from today, hospice liaison note from today. I also reviewed vital signs, nursing flowsheets, and medication administrations record. No labs due to comfort care status.  Vital signs today include respiratory rate of 22.  Comfort medications administered include Robinul  0.4 mg IV scheduled every 4 hours, Dilaudid  drip at 25 mg/h, Ativan  drip at 5 mg/h just increased to 5.5 mg/h due to twitching seizure-like activity.  Today saw the patient bedside, her daughter, grandson, Dr. Barbarann with the hospitalist service, and bedside nurse were present.  Apparently prior to just entering the room the patient began having twitching episodes no apparent mild like seizure and description.  According to family she has had 5 episodes in the past 24 hours.  Otherwise, no signs/symptoms of distress.  I requested bedside nurse to give a 2 mg bolus of Ativan  which promptly resolved her twitching like movements.  Dr. Barbarann myself spent substantial mount of time talk with the patient's daughter.  We reassured her that she is not suffering, and discomfort although the twitching behaviors are distressing to other people in the room.  Spent time providing reassurance as well.  We discussed possible reasons for the twitching including rare adverse effect from opioids in the setting of declining kidney function.  We talked about possible solutions including rotating her opioid from Dilaudid  to fentanyl .  Family is open to options to  alleviate these symptoms.  We also talked about the question about transfer to hospice.  Daughter is not keen on this, as with other family members.  We shared this is further acceptable.  We shared prognosis of likely hours to a day.  When examining the patient her breathing is regular, with apneic pauses.  She was having the twitching seizure-like activity for about 2 minutes while was in the room which was arrested with bolus of benzodiazepine.  I provided emotional and general support through therapeutic listening, empathy, sharing of stories, and other techniques. I answered all questions and addressed all concerns to the best of my ability.  After seeing the patient I spoke with Dr. Barbarann and indicated likely would work to rotate her opioid to fentanyl , provide for Ativan  boluses for any recurrence.  She feels this would be a good idea.  I worked with Dr. Antonette also with the palliative medicine service to calculate a rotation to fentanyl  from her high dose Dilaudid  drip.  Within the past 24 hours hours, patient has required 25 mg/hour of dilaudid  for opioid management. Based on OMEs calculated for this dose and convertin rate to Fentanyl  equivalent, when reducing by more than 50% for incomplete cross tolerance and presence of benzodiazepine, will appropriately start patient on the listed regimen below.   Fentanyl  gtt 500 mch/hr with 200 mcg boluses q 15 mins prn; Fenanyl drip ceiling of 1500 mcg/hr.  Review of Systems  Unable to perform ROS   Objective:   Primary Diagnoses: Present on Admission:  Hematemesis  GIB (gastrointestinal  bleeding)  Acute on chronic systolic heart failure (HCC)  Septic shock (HCC)  AKI (acute kidney injury)   Vital Signs:  BP (!) 86/57 (BP Location: Right Arm)   Pulse (!) 126   Temp 100.1 F (37.8 C) (Oral)   Resp (!) 22   Ht 5' (1.524 m)   Wt 54.4 kg   SpO2 100%   BMI 23.44 kg/m   Physical Exam Vitals and nursing note reviewed.   Constitutional:      General: She is sleeping. She is not in acute distress.    Appearance: She is ill-appearing.  HENT:     Head: Normocephalic and atraumatic.  Pulmonary:     Effort: Pulmonary effort is normal. No respiratory distress.     Comments: Respiratory pattern irregular with apneic pauses Abdominal:     General: Abdomen is flat.  Skin:    General: Skin is warm and dry.  Neurological:     Comments: Twitching like movements likely neurological in the setting of accumulating opioid metabolites     Palliative Assessment/Data: 10%   Existing Vynca/ACP Documentation: None  Assessment & Plan:   HPI/Patient Profile:  Ms. Zuria is a 68 yo female with bladder cancer with bone mets, chronic pain on methadone , HFrEF, NICM with AICD, and COPD.    Patient was transitioned to comfort-focused care last 07/12/2024 given patient's very poor prognosis related to multiorgan failure in the setting of cardiogenic shock.    PMT was consulted to provide support and get involved with symptom management for comfort care.  SUMMARY OF RECOMMENDATIONS   DNR-comfort Continue comfort care See symptom management orders below Ongoing palliative medicine support the patient and family Palliative medicine will continue to follow  Symptom Management:  Tylenol  650 mg PR every 6 hours as needed mild pain (1-3), fever Artificial tears 1 drop OU 4 times daily as needed dry eyes START fentanyl  drip 500 - 1500 mcg/h titrate per instructions START Fentanyl  bolus via infusion 200 mcg IV every 15 minutes as needed severe pain (medicine), signs/symptoms of distress, pain, RR greater than 25 Robinul  0.4 mg IV every 4 hours Ativan  drip IV 0.5 to 10 mg/h titrate per instructions Ativan  bolus 2 mg IV every 15 minutes as needed agitation, anxiety, seizure activity Scopolamine  patch transdermal every 72 hours STOP Versed  IV 2 mg as needed  Code Status: DNR - Comfort  Prognosis: Hours - Days  Discharge  Planning: Anticipated Hospital Death  Discussed with: Patient's family, medical team, nursing team  Thank you for allowing us  to participate in the care of Oprah F Riviere PMT will continue to support holistically.  Billing based on MDM: High  Problems Addressed: One acute or chronic illness or injury that poses a threat to life or bodily function  Risks: Parenteral controlled substances  Detailed review of medical records (labs, imaging, vital signs), medically appropriate exam, discussed with treatment team, counseling and education to patient, family, & staff, documenting clinical information, medication management, coordination of care  Camellia Kays, NP Palliative Medicine Team  Team Phone # [phone] (Nights/Weekends)  04/24/2021, 8:17 AM

## 2024-07-19 NOTE — Assessment & Plan Note (Signed)
 Nutrition Problem: Severe Malnutrition Etiology: chronic illness (bladder cancer with bone mets) Signs/Symptoms: moderate fat depletion, severe muscle depletion, percent weight loss (21% in 9 months) Percent weight loss: 21 % (in 9 months) Interventions: Refer to RD note for recommendations Patient transitioned to comfort measures

## 2024-07-19 NOTE — Progress Notes (Signed)
   This pt was referred yesterday for hospice care at the [facility] house. We have reviewed the records and present to our MD who does agree pt is appropriate for the in patient facility.    I spoke to the daughter who was at bedside this am and she is very hesitant about moving her mother and reports that the family will be in today around 200pm. She would like for them to discuss this as a family and has reported that I can follow back up with her to answer any questions they may have and to get there decision around 400pm today.   Of note: We have to allow 2-4 hours for medications to be delivered to our facility. We would want meds there prior to pt coming. If family agrees to hospice at our facility, we would likely not be able to accommodate this today, due to lateness of hour and pt will likely need to transfer pt tomorrow.   I have updated the MD and CM.   Magdalena Berber RN

## 2024-07-20 DIAGNOSIS — Z7189 Other specified counseling: Secondary | ICD-10-CM | POA: Diagnosis not present

## 2024-07-20 DIAGNOSIS — N179 Acute kidney failure, unspecified: Secondary | ICD-10-CM

## 2024-07-20 DIAGNOSIS — Z789 Other specified health status: Secondary | ICD-10-CM

## 2024-07-20 DIAGNOSIS — Z66 Do not resuscitate: Secondary | ICD-10-CM | POA: Diagnosis not present

## 2024-07-20 DIAGNOSIS — G40419 Other generalized epilepsy and epileptic syndromes, intractable, without status epilepticus: Secondary | ICD-10-CM

## 2024-07-20 DIAGNOSIS — Z515 Encounter for palliative care: Secondary | ICD-10-CM | POA: Diagnosis not present

## 2024-07-20 MED ORDER — MIDAZOLAM-SODIUM CHLORIDE 100-0.9 MG/100ML-% IV SOLN
10.0000 mg/h | INTRAVENOUS | Status: DC
  Administered 2024-07-20: 4 mg/h via INTRAVENOUS
  Administered 2024-07-20: 6 mg/h via INTRAVENOUS
  Administered 2024-07-21 (×2): 8 mg/h via INTRAVENOUS
  Filled 2024-07-20 (×4): qty 100

## 2024-07-20 MED ORDER — PHENOBARBITAL SODIUM 130 MG/ML IJ SOLN
100.0000 mg | Freq: Once | INTRAMUSCULAR | Status: AC
  Administered 2024-07-20: 100 mg via INTRAVENOUS
  Filled 2024-07-20: qty 0.77

## 2024-07-20 MED ORDER — MIDAZOLAM BOLUS VIA INFUSION
2.0000 mg | INTRAVENOUS | Status: DC | PRN
  Administered 2024-07-20: 2 mg via INTRAVENOUS
  Administered 2024-07-20 (×3): 4 mg via INTRAVENOUS
  Administered 2024-07-20: 2 mg via INTRAVENOUS
  Administered 2024-07-20 (×2): 4 mg via INTRAVENOUS
  Administered 2024-07-20: 3 mg via INTRAVENOUS
  Administered 2024-07-20 – 2024-07-21 (×8): 4 mg via INTRAVENOUS
  Administered 2024-07-21: 2 mg via INTRAVENOUS
  Administered 2024-07-21 (×2): 4 mg via INTRAVENOUS

## 2024-07-20 NOTE — Assessment & Plan Note (Signed)
 TTE with LVEF of < 20% and RWMA (new); prior echo in 10/2022 with LVEF of 55 to 60% and G1 DD proBNP > 35,000. Troponin 486 >> 655 Patient received 2 L fluid boluses the night of admission and was on maintenance IVF Transitioned to comfort care

## 2024-07-20 NOTE — Assessment & Plan Note (Signed)
 No clear source of infection other than pancolitis Surprisingly, she had no fever or leukocytosis on admission Blood cultures NGTD Urine culture with multiple species Initially started on ceftriaxone  and Flagyl  but antibiotics were stopped with transition to comfort care

## 2024-07-20 NOTE — Progress Notes (Signed)
 Progress Note   Patient: [name] FMW:[id number] DOB: 06-08-1956 DOA: 07/08/2024     12 DOS: the patient was seen and examined on 07/20/2024   Brief hospital course: 68yo with h/o stage 4 bladder cancer with bony metastasis, chronic pain syndrome, chronic HFrEF s/o AICD, and COPD who presented on 11/13 with n/v/d.  CT with pancolitis, given IV and antibiotics.  On 11/14, she became obtunded with hematemesis and was intubated for mixed cardiogenic and septic shock.  Lactate >8, pro-BNP >35000, troponin 486, WBC 20.6.  Echo showed EF <20%.  She was eventually transitioned to comfort care on 11/15 and is on Dilaudid  infusion.  Palliative care is consulting.  Anticipate in-hospital death.    Assessment & Plan Comfort measures only status End of life care DNR (do not resuscitate) Patient developed multiorgan failure in the setting of mixed cardiogenic and shock Comfort care initiated on 11/15 while in ICU Palliative medicine consulted for assistance with end-of-life care On Dilaudid  infusion with bolus Concern for seizure activity on 11/22  Concern that this may be associated with Dilaudid  metabolites in the setting of renal failure; transitioned to fentanyl  She was on Ativan  infusion but continued to have myoclonic jerks vs. Seizure activity Changed to versed  drip, titrating up Add a dose of phenobarbital  She does not appear to be stable enough for transfer to residential hospice, particularly in light of the high-dose opiate infusion and BZD infusion with seizure activity at this time Anticipate in hospital death  Acute on chronic systolic heart failure (HCC) Cardiogenic shock (HCC) TTE with LVEF of < 20% and RWMA (new); prior echo in 10/2022 with LVEF of 55 to 60% and G1 DD proBNP > 35,000. Troponin 486 >> 655 Patient received 2 L fluid boluses the night of admission and was on maintenance IVF Transitioned to comfort care Septic shock (HCC) Pancolitis Shock liver Lactic  acidosis No clear source of infection other than pancolitis Surprisingly, she had no fever or leukocytosis on admission Blood cultures NGTD Urine culture with multiple species Initially started on ceftriaxone  and Flagyl  but antibiotics were stopped with transition to comfort care AKI (acute kidney injury) Thought to be prerenal due to shock state Transitioned to comfort care Protein-calorie malnutrition, severe Nutrition Problem: Severe Malnutrition Etiology: chronic illness (bladder cancer with bone mets) Signs/Symptoms: moderate fat depletion, severe muscle depletion, percent weight loss (21% in 9 months) Percent weight loss: 21 % (in 9 months) Interventions: Refer to RD note for recommendations Patient transitioned to comfort measures  History of bladder cancer Indwelling R ureteral stent placed in 11/2023 On chemotherapy at time of presentation and appeared to be responding to treatment Urology was consulted on presentation No acute urologic intervention was indicated at that time       Consultants: [facility] Urology Cardiology Oncology Palliative care Hospice Dietician   Procedures: Urology-inserted foley catheter 11/14 Arterial catheter placement 11/14 CVC insertion 11/14 Intubation 11/14-15   Antibiotics: Azithromycin  x 2 Ceftriaxone  x 1 Metronidazole  11/13-14 Zosyn  11/14-15 Vancomycin  11/14-15  30 Day Unplanned Readmission Risk Score    Flowsheet Row ED to Hosp-Admission (Current) from 07/08/2024 in [facility]  30 Day Unplanned Readmission Risk Score (%) 33.49 Filed at 07/20/2024 0801    This score is the patient's risk of an unplanned readmission within 30 days of being discharged (0 -100%). The score is based on dignosis, age, lab data, medications, orders, and past utilization.   Low:  0-14.9   Medium: 15-21.9   High: 22-29.9  Extreme: 30 and above           Subjective: Continues to have frequent (hourly?)  brief episodes of apparent seizure activity.  Otherwise comfortable.   Objective: Vitals:   07/19/24 2339 07/20/24 0403  BP: (!) 61/14 (!) 63/45  Pulse: (!) 25 (!) 115  Resp: (!) 21 20  Temp: (!) 97.5 F (36.4 C)   SpO2: (!) 60% 96%    Intake/Output Summary (Last 24 hours) at 07/20/2024 9146 Last data filed at 07/19/2024 2131 Gross per 24 hour  Intake --  Output 100 ml  Net -100 ml   Filed Weights   07/08/24 1719  Weight: 54.4 kg    Exam:  General:  Appears sedated, comfortable Eyes:  normal lids, closed throughout ENT:  grossly normal lips; dry tongue and oral mucosa Cardiovascular:  RR with tachycardia Respiratory:   Diffuse rhonchi and upper airway noise Abdomen:  soft, NT, ND Skin:  no rash or induration seen on limited exam Musculoskeletal:  no bony abnormality; B hand edema Psychiatric:  obtunded Neurologic:  unable to effectively perform; periodic GTC seizure-appearing activity  Data Reviewed: I have reviewed the patient's lab results since admission.  Pertinent labs for today include:   None     Family Communication: Family member present      Code Status: Do not attempt resuscitation (DNR) - Comfort care   Disposition: Status is: Inpatient Remains inpatient appropriate because: anticipate in-hospital death     Time spent: 50 minutes  Unresulted Labs (From admission, onward)    None        Author: Delon Herald, MD 07/20/2024 8:53 AM  For on call review [email or web address].

## 2024-07-20 NOTE — Progress Notes (Signed)
 Daily Progress Note   Date: 07/20/2024   Patient Name: [name]  DOB: 1955/09/11  MRN: [id number]  Age / Sex: 68 y.o., female  Attending Physician: Barbarann Nest, MD Primary Care Physician: Teressa Harrie HERO, FNP Admit Date: 07/08/2024 Length of Stay: 12 days  Reason for Follow-up: Establishing goals of care  Past Medical History:  Diagnosis Date   Abnormality of gait due to impairment of balance 11/22/2020   Admission for long-term opiate analgesic use 10/24/2019   AKI (acute kidney injury) 11/14/2023   Anemia of chronic disease 06/07/2023   Arthralgia of both hands 04/07/2023   Arthritis of right hip 05/29/2016   Formatting of this note might be different from the original. Added automatically from request for surgery [id number]   Arthritis of right knee 11/27/2016   At risk for side effect of medication 12/18/2023   Bilateral lower extremity edema 06/07/2023   BMI 26.0-26.9,adult 03/29/2020   Bone lesion 11/18/2023   Cardiomyopathy (HCC)    Overview:  Ejection fraction 45% in 2015 Ejection fraction 30 to 35% in November 2018   Chronic active hepatitis (HCC) 01/04/2023   Chronic back pain    Chronic hypoxemic respiratory failure (HCC) 10/24/2019   Chronic narcotic use 03/23/2015   Chronic pain of both knees 12/21/2018   Added automatically from request for surgery [id number]  Formatting of this note might be different from the original. 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   Dyslipidemia 05/31/2015   Elevated serum glucose 06/23/2023   Fatigue 06/23/2023   Folate deficiency 01/08/2024   GERD (gastroesophageal reflux disease)    History of total hip replacement 09/25/2016   Hypoaldosteronism 07/13/2020   Hypotension 05/17/2020   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   Ileus following gastrointestinal surgery (HCC) 03/26/2015   Iron deficiency 11/21/2023   Low vitamin B12 level 11/21/2023   Major depressive disorder, single episode, moderate (HCC) 10/24/2019   Malnutrition of moderate degree 10/20/2020   Mesenteric ischemia 10/16/2020   Migraine without aura and without status migrainosus, not intractable 01/04/2023   Mixed hyperlipidemia 05/31/2015   Mixed incontinence 10/20/2020   Moderate protein-calorie malnutrition 11/03/2023   Nausea with vomiting    Nausea without vomiting 01/29/2024   Nephrostomy present (HCC) 12/01/2023   Neuropathy 01/29/2024   Normocytic anemia 06/23/2023   Osteoporosis 10/24/2019   Other spondylosis with radiculopathy, lumbar region 10/24/2019   Paraplegia (HCC) 11/14/2023   Presence of left artificial hip joint 10/24/2019   Primary insomnia 01/04/2023   PTSD (post-traumatic stress disorder)    Renal lesion 11/05/2023   Rheumatoid arthritis involving multiple sites (HCC) 04/07/2023   Senile osteoporosis 10/24/2019   Serosanguineous chronic otitis media of right ear 08/02/2021  Therapeutic opioid induced constipation 01/04/2023   Thyroid  disease    Urge incontinence of urine 11/03/2023   Urothelial carcinoma of bladder (HCC)  11/18/2023   Vertebral fracture, pathological 12/01/2023   Vitamin D  deficiency 09/25/2023    Subjective:   Subjective: Chart Reviewed. Updates received. Patient Assessed. Created space and opportunity for patient  and family to explore thoughts and feelings regarding current medical situation.  Today's Discussion: Today before meeting with the patient/family, I reviewed the chart notes including previous palliative note from yesterday, internal medicine note from yesterday, internal medicine note from today, hospice liaison note from today. I also reviewed vital signs, nursing flowsheets, and medication administrations record. No labs due to comfort care status.  Vital signs today include temperature 97.5, heart rate 115, respiratory rate 20, blood pressure 63/45, satting 96% on room air.  Comfort medications administered include Robinul  0.4 mg IV scheduled every 4 hours, fentanyl  drip 500 mcg/h, Ativan  drip at 8 mg/h this morning with a total of 10 boluses of 2 mg each (20 mg total) in the last 18 hours due to twitching seizure-like activity.  Today prior to seeing the patient I received a message from the bedside nurse.  Myoclonic activity continues despite Ativan  drip with increasing rate and significant number of boluses.  I discussed with Dr. Antonette on the palliative medicine team and we will rotate Ativan  infusion to Versed  infusion due to refractory symptoms at end-of-life.  Additional options could also include increasing the drip rate of Versed , Versed  boluses as ordered, can consider one-time bolus of 100 mg phenobarbital  every 30 minutes, or transferred to stepdown/ICU for palliative propofol  sedation  Today I saw the patient bedside first thing in the morning, a family member was present but sleeping and I elected to not wake her.  At that time the patient appeared comfortable, no myoclonic activity noted.  I discussed with the charge nurse and bedside nurse plan to transition to  Versed  for attempting better control of refractory end-of-life symptoms.  They are in agreement.  Secure chat was sent also to pharmacy to notified.  I told the bedside nurse that I would be back later in the day to check on her.  ADDENDUM: Around 95 AM I returned to the patient's bedside to check, on Versed  drip which is ongoing and adjustment started recently.  While I was here the patient began having myoclonic activity and I bolused her 2 mg Versed .  Myoclonic activity arrested during the bolus, lasted a total of about 30 to 60 seconds.  The patient's sister is a family member at the bedside and I again reassured that the patient is not suffering/symptomatic from these myoclonic twitching, however we are attempting to change medications to better control them for family's comfort as well.  Discussed pathophysiology of opioid byproduct accumulation and the use of benzodiazepines in the setting.  I explained there are other options as well depending on how she does, although the Versed  was just recently started and I like to give it some time to see if it is more effective.  Dr. Barbarann was also present during this visit.  I shared that I would be back later this afternoon to check on the patient again and nursing is aware of how to contact us  for any questions or concerns.  I also reassured bedside nursing to please call us  for any problems.  Afterward I debriefed with Dr. Barbarann and indicated possible options of increasing Versed  drip rate, use of boluses as ordered,  possible addition of phenobarbital , possible transition to propofol  if needed.  We are both in agreement that the patient is approaching end-of-life and likely to pass at any time.  SECOND ADDENDUM: Later in the afternoon I received a message from the nurse indicating that the patient continues to have myoclonic seizure episodes.  I requested additional bolus and increase Versed  drip rate to 5 mg/h and notify of further issues.  START ADDENDUM:  I received more messaging from nursing staff including the charge nurse that the patient is still having myoclonic seizure episodes about every 1-2 hours.  I further discussed with Dr. Antonette with the palliative medicine team to help formulate a multidisciplinary plan.  Advised nursing to increase the Versed  drip rate to 6 mg/h.  I have entered a one-time low-dose of phenobarbital  100 mg.  Requested hospitalist to ask night coverage to symptom check to patient overnight.  If still having myoclonic episodes can increase Versed  drip from 6 mg/h to 8 mg/h.  If these episodes continue to persist can increase from 8 mg/h to 10 mg/h.  Palliative medicine will reassess in the morning and can consider additional options including phenobarbital  drip, Keppra load/drip, rectal diazepam , and others.  I provided emotional and general support through therapeutic listening, empathy, sharing of stories, and other techniques. I answered all questions and addressed all concerns to the best of my ability.  Review of Systems  Unable to perform ROS   Objective:   Primary Diagnoses: Present on Admission:  Hematemesis  GIB (gastrointestinal bleeding)  Acute on chronic systolic heart failure (HCC)  Septic shock (HCC)  AKI (acute kidney injury)   Vital Signs:  BP (!) 63/45 Comment: patient's family requested this RN obtain another set of vitals  Pulse (!) 115 Comment: patient's family requested this RN obtain another set of vitals  Temp (!) 97.5 F (36.4 C) (Oral)   Resp 20   Ht 5' (1.524 m)   Wt 54.4 kg   SpO2 96% Comment: patient's family requested this RN obtain another set of vitals  BMI 23.44 kg/m   Physical Exam Vitals and nursing note reviewed.  Constitutional:      General: She is sleeping. She is not in acute distress.    Appearance: She is ill-appearing.  HENT:     Head: Normocephalic and atraumatic.  Pulmonary:     Effort: Pulmonary effort is normal. No respiratory distress.     Comments:  Respiratory pattern irregular with apneic pauses Abdominal:     General: Abdomen is flat.  Skin:    General: Skin is warm and dry.  Neurological:     Comments: Twitching like movements likely neurological in the setting of accumulating opioid metabolites     Palliative Assessment/Data: 10%   Existing Vynca/ACP Documentation: None  Assessment & Plan:   HPI/Patient Profile:  Ms. Amarea is a 68 yo female with bladder cancer with bone mets, chronic pain on methadone , HFrEF, NICM with AICD, and COPD.    Patient was transitioned to comfort-focused care last 07/12/2024 given patient's very poor prognosis related to multiorgan failure in the setting of cardiogenic shock.    PMT was consulted to provide support and get involved with symptom management for comfort care.  SUMMARY OF RECOMMENDATIONS   DNR-comfort Continue comfort care See symptom management orders below Escalation of palliative sedation was pending clinical progress include addition of phenobarbital , transition to propofol  sedation although this would require transfer to stepdown/ICU Ongoing palliative medicine support the patient and family Palliative medicine  will continue to follow  Symptom Management:  Tylenol  650 mg PR every 6 hours as needed mild pain (1-3), fever Artificial tears 1 drop OU 4 times daily as needed dry eyes Fentanyl  drip 500 - 1500 mcg/h titrate per instructions INCREASED midazolam  infusion to 6 mg/h; hospitalist night coverage to increase as needed with ceiling of 10 mg/h Midazolam  bolus via infusion 2 to 4 mg every 15 minutes as needed myoclonic activity ADDED phenobarbital  100 mg loading dose IV Robinul  0.4 mg IV every 4 hours Ativan  drip IV 0.5 to 10 mg/h titrate per instructions Ativan  bolus 2 mg IV every 15 minutes as needed agitation, anxiety, seizure activity Scopolamine  patch transdermal every 72 hours  Code Status: DNR - Comfort  Prognosis: Hours - Days  Discharge Planning:  Anticipated Hospital Death  Discussed with: Patient's family, medical team, nursing team  Thank you for allowing us  to participate in the care of Hesper F Pong PMT will continue to support holistically.  Billing based on MDM: High   Problems Addressed: One acute or chronic illness or injury that poses a threat to life or bodily function  Risks: Parenteral controlled substances  Detailed review of medical records (labs, imaging, vital signs), medically appropriate exam, discussed with treatment team, counseling and education to patient, family, & staff, documenting clinical information, medication management, coordination of care  Camellia Kays, NP Palliative Medicine Team  Team Phone # [phone] (Nights/Weekends)  04/24/2021, 8:17 AM

## 2024-07-20 NOTE — Progress Notes (Signed)
   07/20/24 1620  Spiritual Encounters  Type of Visit Follow up  Conversation partners present during encounter Nurse  Referral source Chaplain team   I attempted to offer spiritual care support to Mr [name], but he had not yet arrived. I checked in with [name], RN to assess progress and needs. Will continue to follow.  [name] L. [name].Div

## 2024-07-20 NOTE — Assessment & Plan Note (Signed)
 Nutrition Problem: Severe Malnutrition Etiology: chronic illness (bladder cancer with bone mets) Signs/Symptoms: moderate fat depletion, severe muscle depletion, percent weight loss (21% in 9 months) Percent weight loss: 21 % (in 9 months) Interventions: Refer to RD note for recommendations Patient transitioned to comfort measures

## 2024-07-20 NOTE — Progress Notes (Signed)
 40 ml of Ativan  wasted. Maurilio RN was a witness. Wasted due to change in medication.

## 2024-07-20 NOTE — Progress Notes (Signed)
 RN notified Lynwood Kipper, NP that here is no [employer] magnet in place over patient's pacemaker. RN asked if the magnet is supposed to be in place for the ICD to stop working.   Per family at bedside, the [employer] rep interrogated the patient's pacemaker and stated it is off.   RN found [employer] document in patient's physical chart that shows all items are turned off. See below for photocopy.   RN was told nothing else needs to be done at this time per the NP. AC is also aware of the above information.

## 2024-07-20 NOTE — Progress Notes (Signed)
 I provided emotional support to Artis's family who have been remaining by her bedside for the past 10 days so that she is not alone. They are weary but committed to supporting her and making sure she feels their love.  I facilitated story sharing about Nevia and reflection on the past ten days. Her daughter has been her main care-giver for the past 6 months, but she has been well supported by the rest of the family and she was able to get some rest last night.  They asked for some follow up support later in the afternoon when [place] plans to come over. We will plan to come then, but if needs arise before, please page us .

## 2024-07-20 NOTE — Assessment & Plan Note (Signed)
 Thought to be prerenal due to shock state Transitioned to comfort care

## 2024-07-20 NOTE — Assessment & Plan Note (Addendum)
 Patient developed multiorgan failure in the setting of mixed cardiogenic and shock Comfort care initiated on 11/15 while in ICU Palliative medicine consulted for assistance with end-of-life care On Dilaudid  infusion with bolus Concern for seizure activity on 11/22  Concern that this may be associated with Dilaudid  metabolites in the setting of renal failure; transitioned to fentanyl  She was on Ativan  infusion but continued to have myoclonic jerks vs. Seizure activity Changed to versed  drip, titrating up Add a dose of phenobarbital  She does not appear to be stable enough for transfer to residential hospice, particularly in light of the high-dose opiate infusion and BZD infusion with seizure activity at this time Anticipate in hospital death

## 2024-07-20 NOTE — Assessment & Plan Note (Signed)
 Indwelling R ureteral stent placed in 11/2023 On chemotherapy at time of presentation and appeared to be responding to treatment Urology was consulted on presentation No acute urologic intervention was indicated at that time

## 2024-07-20 NOTE — Plan of Care (Signed)
  Problem: Respiratory: Goal: Ability to maintain a clear airway and adequate ventilation will improve Outcome: Progressing   Problem: Role Relationship: Goal: Method of communication will improve Outcome: Progressing   Problem: Clinical Measurements: Goal: Respiratory complications will improve Outcome: Progressing Goal: Cardiovascular complication will be avoided Outcome: Progressing   Problem: Activity: Goal: Risk for activity intolerance will decrease Outcome: Progressing   Problem: Coping: Goal: Level of anxiety will decrease Outcome: Progressing   Problem: Safety: Goal: Ability to remain free from injury will improve Outcome: Progressing

## 2024-07-20 NOTE — Progress Notes (Addendum)
 Called [employer] to verify if pacemaker has been correctly turned off. Per report, there should be a magnet to keep defibrillator from firing. A representative has been paged to visit the patient and verify.   Received call back from representative of [employer]  who verified everything has been turned off per previous visits, and nothing else needs to be done.

## 2024-07-21 DIAGNOSIS — Z515 Encounter for palliative care: Secondary | ICD-10-CM | POA: Diagnosis not present

## 2024-07-21 DIAGNOSIS — Z7189 Other specified counseling: Secondary | ICD-10-CM | POA: Diagnosis not present

## 2024-07-21 DIAGNOSIS — Z789 Other specified health status: Secondary | ICD-10-CM | POA: Diagnosis not present

## 2024-07-21 DIAGNOSIS — G253 Myoclonus: Secondary | ICD-10-CM

## 2024-07-21 DIAGNOSIS — Z66 Do not resuscitate: Secondary | ICD-10-CM | POA: Diagnosis not present

## 2024-07-21 MED ORDER — MIDAZOLAM-SODIUM CHLORIDE 100-0.9 MG/100ML-% IV SOLN
10.0000 mg/h | INTRAVENOUS | Status: DC
  Administered 2024-07-22 (×2): 10 mg/h via INTRAVENOUS
  Filled 2024-07-21: qty 100

## 2024-07-21 MED ORDER — PHENOBARBITAL SODIUM 130 MG/ML IJ SOLN
100.0000 mg | Freq: Four times a day (QID) | INTRAMUSCULAR | Status: DC
  Administered 2024-07-22 (×2): 100 mg via INTRAVENOUS
  Filled 2024-07-21 (×2): qty 1

## 2024-07-21 MED ORDER — MIDAZOLAM BOLUS VIA INFUSION
6.0000 mg | INTRAVENOUS | Status: DC | PRN
  Administered 2024-07-22 (×2): 6 mg via INTRAVENOUS

## 2024-07-21 MED ORDER — SODIUM CHLORIDE 0.9 % IV SOLN
910.0000 mg | Freq: Once | INTRAVENOUS | Status: AC
  Administered 2024-07-21: 910 mg via INTRAVENOUS
  Filled 2024-07-21: qty 7

## 2024-07-21 MED ORDER — PHENOBARBITAL SODIUM 130 MG/ML IJ SOLN
100.0000 mg | Freq: Three times a day (TID) | INTRAMUSCULAR | Status: DC
  Administered 2024-07-21: 100 mg via INTRAVENOUS
  Filled 2024-07-21: qty 1

## 2024-07-21 NOTE — Progress Notes (Signed)
  Patient given bolus of Versed  for seizure activity per mar. RN at bedside , seizure lasted about 1 minute.

## 2024-07-21 NOTE — Progress Notes (Signed)
 Patient given bolus of Versed  for seizure activity per mar. Family reported seizure activity no longer than a minute.

## 2024-07-21 NOTE — Progress Notes (Signed)
 Left message on voicemail for Palliative at contact Team Phone # [phone] (Nights/Weekends). Per family,  palliative stated they discussed with night team what next steps would be.

## 2024-07-21 NOTE — Plan of Care (Signed)
  Problem: Activity: Goal: Ability to tolerate increased activity will improve Outcome: Not Applicable   Problem: Respiratory: Goal: Ability to maintain a clear airway and adequate ventilation will improve Outcome: Not Applicable   Problem: Role Relationship: Goal: Method of communication will improve Outcome: Not Applicable   Problem: Education: Goal: Knowledge of General Education information will improve Description: Including pain rating scale, medication(s)/side effects and non-pharmacologic comfort measures Outcome: Not Applicable   Problem: Health Behavior/Discharge Planning: Goal: Ability to manage health-related needs will improve Outcome: Not Applicable   Problem: Clinical Measurements: Goal: Ability to maintain clinical measurements within normal limits will improve Outcome: Not Applicable Goal: Will remain free from infection Outcome: Not Applicable Goal: Diagnostic test results will improve Outcome: Not Applicable Goal: Respiratory complications will improve Outcome: Not Applicable Goal: Cardiovascular complication will be avoided Outcome: Not Applicable   Problem: Activity: Goal: Risk for activity intolerance will decrease Outcome: Not Applicable   Problem: Nutrition: Goal: Adequate nutrition will be maintained Outcome: Not Applicable   Problem: Coping: Goal: Level of anxiety will decrease Outcome: Not Applicable   Problem: Elimination: Goal: Will not experience complications related to bowel motility Outcome: Not Applicable Goal: Will not experience complications related to urinary retention Outcome: Not Applicable   Problem: Pain Managment: Goal: General experience of comfort will improve and/or be controlled Outcome: Not Applicable   Problem: Safety: Goal: Ability to remain free from injury will improve Outcome: Not Applicable   Problem: Skin Integrity: Goal: Risk for impaired skin integrity will decrease Outcome: Not Applicable    Problem: Education: Goal: Ability to describe self-care measures that may prevent or decrease complications (Diabetes Survival Skills Education) will improve Outcome: Not Applicable Goal: Individualized Educational Video(s) Outcome: Not Applicable   Problem: Coping: Goal: Ability to adjust to condition or change in health will improve Outcome: Not Applicable   Problem: Fluid Volume: Goal: Ability to maintain a balanced intake and output will improve Outcome: Not Applicable   Problem: Health Behavior/Discharge Planning: Goal: Ability to identify and utilize available resources and services will improve Outcome: Not Applicable Goal: Ability to manage health-related needs will improve Outcome: Not Applicable   Problem: Metabolic: Goal: Ability to maintain appropriate glucose levels will improve Outcome: Not Applicable   Problem: Nutritional: Goal: Maintenance of adequate nutrition will improve Outcome: Not Applicable Goal: Progress toward achieving an optimal weight will improve Outcome: Not Applicable   Problem: Skin Integrity: Goal: Risk for impaired skin integrity will decrease Outcome: Not Applicable   Problem: Tissue Perfusion: Goal: Adequacy of tissue perfusion will improve Outcome: Not Applicable

## 2024-07-21 NOTE — Progress Notes (Signed)
 Daily Progress Note   Date: 07/21/2024   Patient Name: [name]  DOB: 1956/04/20  MRN: [id number]  Age / Sex: 68 y.o., female  Attending Physician: Rojelio Nest, DO Primary Care Physician: Teressa Harrie HERO, FNP Admit Date: 07/08/2024 Length of Stay: 13 days  Reason for Follow-up: Establishing goals of care  Past Medical History:  Diagnosis Date   Abnormality of gait due to impairment of balance 11/22/2020   Admission for long-term opiate analgesic use 10/24/2019   AKI (acute kidney injury) 11/14/2023   Anemia of chronic disease 06/07/2023   Arthralgia of both hands 04/07/2023   Arthritis of right hip 05/29/2016   Formatting of this note might be different from the original. Added automatically from request for surgery [id number]   Arthritis of right knee 11/27/2016   At risk for side effect of medication 12/18/2023   Bilateral lower extremity edema 06/07/2023   BMI 26.0-26.9,adult 03/29/2020   Bone lesion 11/18/2023   Cardiomyopathy (HCC)    Overview:  Ejection fraction 45% in 2015 Ejection fraction 30 to 35% in November 2018   Chronic active hepatitis (HCC) 01/04/2023   Chronic back pain    Chronic hypoxemic respiratory failure (HCC) 10/24/2019   Chronic narcotic use 03/23/2015   Chronic pain of both knees 12/21/2018   Added automatically from request for surgery [id number]  Formatting of this note might be different from the original. 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   Dyslipidemia 05/31/2015   Elevated serum glucose 06/23/2023   Fatigue 06/23/2023   Folate deficiency 01/08/2024   GERD (gastroesophageal reflux disease)    History of total hip replacement 09/25/2016   Hypoaldosteronism 07/13/2020   Hypotension 05/17/2020   ICD (implantable cardioverter-defibrillator) in place 06/19/2017   Ileus following gastrointestinal surgery (HCC) 03/26/2015   Iron deficiency 11/21/2023   Low vitamin B12 level 11/21/2023   Major depressive disorder, single episode, moderate (HCC) 10/24/2019   Malnutrition of moderate degree 10/20/2020   Mesenteric ischemia 10/16/2020   Migraine without aura and without status migrainosus, not intractable 01/04/2023   Mixed hyperlipidemia 05/31/2015   Mixed incontinence 10/20/2020   Moderate protein-calorie malnutrition 11/03/2023   Nausea with vomiting    Nausea without vomiting 01/29/2024   Nephrostomy present (HCC) 12/01/2023   Neuropathy 01/29/2024   Normocytic anemia 06/23/2023   Osteoporosis 10/24/2019   Other spondylosis with radiculopathy, lumbar region 10/24/2019   Paraplegia (HCC) 11/14/2023   Presence of left artificial hip joint 10/24/2019   Primary insomnia 01/04/2023   PTSD (post-traumatic stress disorder)    Renal lesion 11/05/2023   Rheumatoid arthritis involving multiple sites (HCC) 04/07/2023   Senile osteoporosis 10/24/2019   Serosanguineous chronic otitis media of right ear 08/02/2021  Therapeutic opioid induced constipation 01/04/2023   Thyroid  disease    Urge incontinence of urine 11/03/2023   Urothelial carcinoma of bladder (HCC)  11/18/2023   Vertebral fracture, pathological 12/01/2023   Vitamin D  deficiency 09/25/2023    Subjective:   Subjective: Chart Reviewed. Updates received. Patient Assessed. Created space and opportunity for patient  and family to explore thoughts and feelings regarding current medical situation.  Today's Discussion: Today before meeting with the patient/family, I reviewed the chart notes including chaplain notes from yesterday, nursing note from today. I also reviewed vital signs, nursing flowsheets, and medication administrations record. No labs due to comfort care status.  Vital signs today include temperature heart rate 103, respiratory rate 18, blood pressure 63/40, saturation 70% on room air.  Comfort medications administered include Robinul  0.4 mg IV scheduled every 4 hours, fentanyl  drip 500 mcg/h 3 boluses Vynca 200 mcg each in the past 24 hours, lorazepam  drip discontinued at 9:50 AM yesterday with a total of 8 mg of boluses in early morning hours yesterday, and substitution of lorazepam  added midazolam  infusion initially at 4 mg/h increased to 8 mg/h this morning with a total of 14 boluses in the past 24 hours (49 mg in total) due to twitching seizure-like activity.  Today prior to seeing the patient I received a message from the bedside nurse.  Myoclonic activity continues despite midazolam  drip and significant number of boluses.  No assessment by overnight hospitalist nor adjustment as previously recommended.  Upon review it appears the patient has been requiring a midazolam  bolus approximately every hour for these myoclonic events.  The exception to this was after 100 mg phenobarbital  load yesterday when she did not require a bolus for 3 hours.  I discussed with Dr. Antonette on the palliative medicine team and Nikola Glogovac, PharmD.  Will increase midazolam  drip to 8 mg/h, complete another phenobarbital  load (dosing per pharmacy) and start phenobarbital  100 mg every 8 hours.  Additional  options could also include increasing the drip rate of Versed  further, Versed  boluses as ordered, can consider Keppra load and infusion, or transferred to stepdown/ICU for palliative propofol  sedation  Today I saw the patient bedside first thing in the morning, her fianc of 30 years was present.  At that time the patient appeared comfortable, no myoclonic activity noted.  However, shortly after I entered the room the patient began having another myoclonic episode for which I bolused her 2 mg of Versed  and notified nurse of such.  We discussed plan to add phenobarbital  load and every 8 hour infusions.  We discussed again that the patient is not suffering, the seizure activities are distressing for those in the room but she does not feel them.  Her fianc understands.  We spent additional time talking about the patient and their wonderful 3-year relationship.  He shares that he does not want to lose hair, but he is at peace with it.  ADDENDUM: Around 1:30 in the afternoon I went to the bedside to check on the patient.  Her daughter, fianc, and 2 grandchildren were present.  I spent time speaking with her daughter about changes that are made including increasing Versed  8 mg an hour, repeat phenobarbital  load dose per pharmacy, plan for phenobarbital  100 mg every 8 hours.  I shared that she has been having myoclonic episodes on average once an hour with the exception of when she received the 100 mg phenobarbital  low-dose yesterday, which resulted in no episodes or bolus needs for 4 hours.  I expressed my hope that the phenobarbital  ongoing would help keep the seizure activity and may early significantly reduce time.  I shared that if this is not the case there are additional options that remain as well.  After that we spent time talking about the patient, their celebration of the holidays and how does not feel like Christmas time without her.  She shares that normally her house would have been decorated but she  has not felt like it with her mom in the hospital and actively dying.  We spent time talking about the time takes die, some patients linger for some time even though we expect they would pass shortly.  She states that her brothers are struggling with this a little bit.  I reassured her that she is comfortable despite the appearance of the myoclonic activity.  She expresses understanding and appreciation.  I shared that while the patient remains admitted palliative medicine will continue to follow closely for symptom management needs.  Will continue to escalate as needed to provide comfort, peace, dignity.  I provided emotional and general support through therapeutic listening, empathy, sharing of stories, and other techniques. I answered all questions and addressed all concerns to the best of my ability.  Review of Systems  Unable to perform ROS   Objective:   Primary Diagnoses: Present on Admission:  Hematemesis  GIB (gastrointestinal bleeding)  Acute on chronic systolic heart failure (HCC)  Septic shock (HCC)  AKI (acute kidney injury)   Vital Signs:  BP (!) 63/40   Pulse (!) 114   Temp (!) 97.5 F (36.4 C) (Oral)   Resp 17   Ht 5' (1.524 m)   Wt 54.4 kg   SpO2 (!) 82%   BMI 23.44 kg/m   Physical Exam Vitals and nursing note reviewed.  Constitutional:      General: She is sleeping. She is not in acute distress.    Appearance: She is ill-appearing.  HENT:     Head: Normocephalic and atraumatic.  Pulmonary:     Effort: Pulmonary effort is normal. No respiratory distress.     Comments: Respiratory pattern irregular with apneic pauses Abdominal:     General: Abdomen is flat.  Skin:    General: Skin is warm and dry.  Neurological:     Comments: Twitching like movements likely neurological in the setting of accumulating opioid metabolites     Palliative Assessment/Data: 10%   Existing Vynca/ACP Documentation: None  Assessment & Plan:   HPI/Patient Profile:  Ms.  Aparna is a 68 yo female with bladder cancer with bone mets, chronic pain on methadone , HFrEF, NICM with AICD, and COPD.    Patient was transitioned to comfort-focused care last 07/12/2024 given patient's very poor prognosis related to multiorgan failure in the setting of cardiogenic shock.    PMT was consulted to provide support and get involved with symptom management for comfort care.  SUMMARY OF RECOMMENDATIONS   DNR-comfort Continue comfort care See symptom management orders below Escalation of palliative sedation was pending clinical progress include addition of phenobarbital , transition to propofol  sedation although this would require transfer to stepdown/ICU Ongoing palliative medicine support the patient and family Palliative medicine will continue to follow  Symptom Management:  Tylenol  650 mg PR every 6 hours as needed mild pain (1-3), fever Artificial tears 1 drop OU 4 times daily as needed dry eyes Fentanyl  drip 500 - 1500 mcg/h titrate per instructions INCREASED midazolam  infusion to 8 mg/h; can further increase up to 10  mg/h if needed Midazolam  bolus via infusion 2 to 4 mg every 15 minutes as needed myoclonic activity REPEAT phenobarbital  loading dose IV (dosed per pharmacy) ADD phenobarbital  100 mg every 8 hours Robinul  0.4 mg IV every 4 hours Ativan  drip IV 0.5 to 10 mg/h titrate per instructions Ativan  bolus 2 mg IV every 15 minutes as needed agitation, anxiety, seizure activity Scopolamine  patch transdermal every 72 hours  Code Status: DNR - Comfort  Prognosis: Hours - Days  Discharge Planning: Anticipated Hospital Death  Discussed with: Patient's family, medical team, nursing team  Thank you for allowing us  to participate in the care of Tyasia F Minniefield PMT will continue to support holistically.  Billing based on MDM: High   Problems Addressed: One acute or chronic illness or injury that poses a threat to life or bodily function  Risks: Parenteral controlled  substances  Detailed review of medical records (labs, imaging, vital signs), medically appropriate exam, discussed with treatment team, counseling and education to patient, family, & staff, documenting clinical information, medication management, coordination of care  Camellia Kays, NP Palliative Medicine Team  Team Phone # [phone] (Nights/Weekends)  04/24/2021, 8:17 AM

## 2024-07-21 NOTE — Progress Notes (Signed)
 Dr. Jeryl with palliative returned call for palliative and has placed new order. See orders.

## 2024-07-21 NOTE — Progress Notes (Signed)
 PROGRESS NOTE    [name]  FMW:[id number] DOB: 23-Apr-1956 DOA: 07/08/2024 PCP: Teressa Harrie HERO, FNP     Brief Narrative:  [name] is a 68yo with h/o stage 4 bladder cancer with bony metastasis, chronic pain syndrome, chronic HFrEF s/o AICD, and COPD who presented on 11/13 with n/v/d.  CT with pancolitis, given IV and antibiotics.  On 11/14, she became obtunded with hematemesis and was intubated for mixed cardiogenic and septic shock.  Lactate >8, pro-BNP >35000, troponin 486, WBC 20.6.  Echo showed EF <20%.  She was eventually transitioned to comfort care on 11/15 and is on Dilaudid  infusion.  Palliative care is consulting.  Anticipate in-hospital death.  New events last 24 hours / Subjective: Patient continued to have seizure activity overnight and this morning. Fiance at bedside. Discussed with PMT and pharmacy for versed  drip and phenobarbital .   Assessment & Plan:   Principal Problem:   Comfort measures only status Active Problems:   Acute on chronic systolic heart failure (HCC)   Septic shock (HCC)   AKI (acute kidney injury)   Hematemesis   GIB (gastrointestinal bleeding)   Cardiogenic shock (HCC)   Protein-calorie malnutrition, severe   History of bladder cancer   DNR (do not resuscitate)   Pancolitis   Palliative care by specialist   Shock liver   Lactic acidosis   End of life care   Remains comfort care. Continue IV fentanyl  drip, IV versed  drip and phenobarbital .     Disposition Plan: Anticipate in hospital death  Status is: Inpatient    Antimicrobials:  Anti-infectives (From admission, onward)    Start     Dose/Rate Route Frequency Ordered Stop   07/09/24 1400  vancomycin  (VANCOREADY) IVPB 1250 mg/250 mL  Status:  Discontinued        1,250 mg 166.7 mL/hr over 90 Minutes Intravenous Every 48 hours 07/09/24 1239 07/10/24 2120   07/09/24 1100  piperacillin -tazobactam (ZOSYN ) IVPB 3.375 g  Status:  Discontinued        3.375 g 12.5 mL/hr over 240  Minutes Intravenous Every 8 hours 07/09/24 1012 07/10/24 2120   07/09/24 0815  azithromycin  (ZITHROMAX ) 500 mg in sodium chloride  0.9 % 250 mL IVPB  Status:  Discontinued        500 mg 250 mL/hr over 60 Minutes Intravenous Every 24 hours 07/09/24 0804 07/10/24 2120   07/08/24 2000  metroNIDAZOLE  (FLAGYL ) IVPB 500 mg  Status:  Discontinued        500 mg 100 mL/hr over 60 Minutes Intravenous Every 12 hours 07/08/24 1904 07/09/24 1225   07/08/24 2000  cefTRIAXone  (ROCEPHIN ) 2 g in sodium chloride  0.9 % 100 mL IVPB  Status:  Discontinued        2 g 200 mL/hr over 30 Minutes Intravenous Every 24 hours 07/08/24 1904 07/09/24 0956   07/08/24 1500  cefTRIAXone  (ROCEPHIN ) 1 g in sodium chloride  0.9 % 100 mL IVPB  Status:  Discontinued        1 g 200 mL/hr over 30 Minutes Intravenous  Once 07/08/24 1455 07/08/24 1456        Objective: Vitals:   07/20/24 2035 07/21/24 0243 07/21/24 0826 07/21/24 1153  BP: (!) 63/40   (!) 48/37  Pulse: (!) 114   (!) 103  Resp: (!) 22  17 18   Temp:      TempSrc:      SpO2: (!) 70% (!) 82%  95%  Weight:      Height:  Intake/Output Summary (Last 24 hours) at 07/21/2024 1452 Last data filed at 07/20/2024 1800 Gross per 24 hour  Intake 218.37 ml  Output --  Net 218.37 ml   Filed Weights   07/08/24 1719  Weight: 54.4 kg    Examination:  General exam: Appears calm and comfortable  Data Reviewed: I have personally reviewed following labs and imaging studies  CBC: No results for input(s): WBC, NEUTROABS, HGB, HCT, MCV, PLT in the last 168 hours. Basic Metabolic Panel: No results for input(s): NA, K, CL, CO2, GLUCOSE, BUN, CREATININE, CALCIUM , MG, PHOS in the last 168 hours. GFR: Estimated Creatinine Clearance: 20.4 mL/min (A) (by C-G formula based on SCr of 1.84 mg/dL (H)). Liver Function Tests: No results for input(s): AST, ALT, ALKPHOS, BILITOT, PROT, ALBUMIN  in the last 168 hours. No results for  input(s): LIPASE, AMYLASE in the last 168 hours. No results for input(s): AMMONIA in the last 168 hours. Coagulation Profile: No results for input(s): INR, PROTIME in the last 168 hours. Cardiac Enzymes: No results for input(s): CKTOTAL, CKMB, CKMBINDEX, TROPONINI in the last 168 hours. BNP (last 3 results) Recent Labs    07/09/24 1323 07/10/24 0403  PROBNP >35,000.0* >35,000.0*   HbA1C: No results for input(s): HGBA1C in the last 72 hours. CBG: No results for input(s): GLUCAP in the last 168 hours. Lipid Profile: No results for input(s): CHOL, HDL, LDLCALC, TRIG, CHOLHDL, LDLDIRECT in the last 72 hours. Thyroid  Function Tests: No results for input(s): TSH, T4TOTAL, FREET4, T3FREE, THYROIDAB in the last 72 hours. Anemia Panel: No results for input(s): VITAMINB12, FOLATE, FERRITIN, TIBC, IRON, RETICCTPCT in the last 72 hours. Sepsis Labs: No results for input(s): PROCALCITON, LATICACIDVEN in the last 168 hours.  No results found for this or any previous visit (from the past 240 hours).    Radiology Studies: No results found.    Scheduled Meds:  glycopyrrolate   0.4 mg Intravenous Q4H   mouth rinse  15 mL Mouth Rinse 4 times per day   PHENObarbital   100 mg Intravenous Q8H   scopolamine   1 patch Transdermal Q72H   Continuous Infusions:  fentaNYL  infusion INTRAVENOUS 550 mcg/hr (07/21/24 1232)   midazolam  8 mg/hr (07/21/24 0903)     LOS: 13 days   Time spent: 25 minutes   Delon Hoe, DO [facility] 07/21/2024, 2:52 PM   Available via Epic secure chat 7am-7pm After these hours, please refer to coverage provider listed on [email or web address]

## 2024-07-21 NOTE — Progress Notes (Signed)
 Patient given bolus of Versed  for seizure activity per mar. RN was not present during seizure activity, family reported less than a minute time frame of activity.

## 2024-07-21 NOTE — Plan of Care (Signed)
  Problem: Activity: Goal: Ability to tolerate increased activity will improve Outcome: Not Progressing   Problem: Respiratory: Goal: Ability to maintain a clear airway and adequate ventilation will improve Outcome: Not Progressing   Problem: Role Relationship: Goal: Method of communication will improve Outcome: Not Progressing   Problem: Education: Goal: Knowledge of General Education information will improve Description: Including pain rating scale, medication(s)/side effects and non-pharmacologic comfort measures Outcome: Not Progressing   Problem: Health Behavior/Discharge Planning: Goal: Ability to manage health-related needs will improve Outcome: Not Progressing   Problem: Clinical Measurements: Goal: Ability to maintain clinical measurements within normal limits will improve Outcome: Not Progressing Goal: Will remain free from infection Outcome: Not Progressing Goal: Diagnostic test results will improve Outcome: Not Progressing Goal: Respiratory complications will improve Outcome: Not Progressing Goal: Cardiovascular complication will be avoided Outcome: Not Progressing   Problem: Activity: Goal: Risk for activity intolerance will decrease Outcome: Not Progressing   Problem: Nutrition: Goal: Adequate nutrition will be maintained Outcome: Not Progressing   Problem: Coping: Goal: Level of anxiety will decrease Outcome: Not Progressing   Problem: Elimination: Goal: Will not experience complications related to bowel motility Outcome: Not Progressing Goal: Will not experience complications related to urinary retention Outcome: Not Progressing   Problem: Pain Managment: Goal: General experience of comfort will improve and/or be controlled Outcome: Not Progressing   Problem: Safety: Goal: Ability to remain free from injury will improve Outcome: Not Progressing   Problem: Skin Integrity: Goal: Risk for impaired skin integrity will decrease Outcome: Not  Progressing   Problem: Education: Goal: Ability to describe self-care measures that may prevent or decrease complications (Diabetes Survival Skills Education) will improve Outcome: Not Progressing Goal: Individualized Educational Video(s) Outcome: Not Progressing   Problem: Coping: Goal: Ability to adjust to condition or change in health will improve Outcome: Not Progressing   Problem: Fluid Volume: Goal: Ability to maintain a balanced intake and output will improve Outcome: Not Progressing   Problem: Health Behavior/Discharge Planning: Goal: Ability to identify and utilize available resources and services will improve Outcome: Not Progressing Goal: Ability to manage health-related needs will improve Outcome: Not Progressing   Problem: Metabolic: Goal: Ability to maintain appropriate glucose levels will improve Outcome: Not Progressing   Problem: Nutritional: Goal: Maintenance of adequate nutrition will improve Outcome: Not Progressing Goal: Progress toward achieving an optimal weight will improve Outcome: Not Progressing   Problem: Skin Integrity: Goal: Risk for impaired skin integrity will decrease Outcome: Not Progressing   Problem: Tissue Perfusion: Goal: Adequacy of tissue perfusion will improve Outcome: Not Progressing

## 2024-07-22 DIAGNOSIS — Z515 Encounter for palliative care: Secondary | ICD-10-CM | POA: Diagnosis not present

## 2024-07-22 MED ORDER — MIDAZOLAM BOLUS VIA INFUSION
6.0000 mg | INTRAVENOUS | Status: DC | PRN
Start: 2024-07-22 — End: 2024-07-22

## 2024-07-22 MED ORDER — DIAZEPAM 5 MG/ML IJ SOLN: 10.0000 mg | Freq: Once | INTRAMUSCULAR | Status: DC | PRN

## 2024-07-22 MED ORDER — DIAZEPAM 5 MG/ML IJ SOLN: 10.0000 mg | INTRAMUSCULAR | Status: DC | PRN

## 2024-07-22 MED ORDER — MIDAZOLAM-SODIUM CHLORIDE 100-0.9 MG/100ML-% IV SOLN
12.0000 mg/h | INTRAVENOUS | Status: DC
  Administered 2024-07-22: 12 mg/h via INTRAVENOUS

## 2024-07-22 MED ORDER — DIAZEPAM 2.5 MG RE GEL: 10.0000 mg | Freq: Once | RECTAL | Status: DC

## 2024-07-22 MED ORDER — SODIUM CHLORIDE 0.9 % IV SOLN
910.0000 mg | Freq: Once | INTRAVENOUS | Status: AC
  Administered 2024-07-22: 910 mg via INTRAVENOUS
  Filled 2024-07-22: qty 7

## 2024-07-24 ENCOUNTER — Other Ambulatory Visit (HOSPITAL_COMMUNITY): Payer: Self-pay

## 2024-07-26 NOTE — Death Summary Note (Signed)
 DEATH SUMMARY   Patient Details  Name: [name] MRN: [id number] DOB: February 21, 1956 ERE:Gjrnad, Harrie HERO, FNP Admission/Discharge Information   Admit Date:  2024/07/17  Date of Death: Date of Death: 07/31/2024  Time of Death: Time of Death: 1031-08-05  Length of Stay: 08-17-2024   Principle Cause of death: Cardiogenic shock   Hospital Diagnoses: Principal Problem:   Comfort measures only status Active Problems:   Acute on chronic systolic heart failure (HCC)   Septic shock (HCC)   AKI (acute kidney injury)   Hematemesis   GIB (gastrointestinal bleeding)   Cardiogenic shock (HCC)   Protein-calorie malnutrition, severe   History of bladder cancer   DNR (do not resuscitate)   Pancolitis   Palliative care by specialist   Shock liver   Lactic acidosis   End of life care   Hospital Course: [name] is a 68yo with h/o stage 4 bladder cancer with bony metastasis, chronic pain syndrome, chronic HFrEF s/o AICD, and COPD who presented on 17-Jul-2024 with n/v/d.  CT with pancolitis, given IV and antibiotics.  On 11/14, she became obtunded with hematemesis and was intubated for mixed cardiogenic and septic shock.  Lactate >8, pro-BNP >35000, troponin 486, WBC 20.6.  Echo showed EF <20%.  She was eventually transitioned to comfort care on 11/15.          The results of significant diagnostics from this hospitalization (including imaging, microbiology, ancillary and laboratory) are listed below for reference.   Significant Diagnostic Studies: CT HEAD WO CONTRAST ( ) Result Date: 07/09/2024 EXAM: CT HEAD WITHOUT CONTRAST 07/09/2024 03:23:12 PM TECHNIQUE: CT of the head was performed without the administration of intravenous contrast. Automated exposure control, iterative reconstruction, and/or weight based adjustment of the mA/kV was utilized to reduce the radiation dose to as low as reasonably achievable. COMPARISON: CT head 11/02/2018. CLINICAL HISTORY: AMS. FINDINGS: BRAIN AND VENTRICLES: No  acute hemorrhage. No evidence of acute infarct. Redemonstrated remote lacunar infarct in the right basal ganglia. Mild chronic microvascular ischemic changes. Similar mild age related volume loss. No hydrocephalus. No extra-axial collection. No mass effect or midline shift. ORBITS: No acute abnormality. SINUSES: No acute abnormality. SOFT TISSUES AND SKULL: No acute soft tissue abnormality. No skull fracture. IMPRESSION: 1. No acute intracranial abnormality. 2. Remote lacunar infarct in the right basal ganglia, unchanged. 3. Mild chronic microvascular ischemic changes and mild volume loss. Electronically signed by: Donnice Mania MD 07/09/2024 04:04 PM EST RP Workstation: [id number]   ECHOCARDIOGRAM COMPLETE Result Date: 07/09/2024    ECHOCARDIOGRAM REPORT   Patient Name:   [name] [facility] Date of Exam: 07/09/2024 Medical Rec #:  [id number]      Height:       60.0 in Accession #:    [id number]     Weight:       120.0 lb Date of Birth:  [birth year]      BSA:          1.502 m Patient Age:    68 years       BP:           93/74 mmHg Patient Gender: F              HR:           86 bpm. Exam Location:  Inpatient Procedure: 2D Echo, Cardiac Doppler, Color Doppler and Intracardiac            Opacification Agent (Both Spectral and Color Flow Doppler were  utilized during procedure). Indications:    CHF-Acute Systolic I50.21  History:        Patient has prior history of Echocardiogram examinations, most                 recent 06/25/2023. Cardiomyopathy; CAD.  Sonographer:    Jayson Gaskins Referring Phys: [phone] GRACE E BOWSER IMPRESSIONS  1. Left ventricular ejection fraction, by estimation, is <20%. The left ventricle has severely decreased function. The left ventricle demonstrates regional wall motion abnormalities (see scoring diagram/findings for description). The left ventricular internal cavity size was moderately dilated. Left ventricular diastolic function could not be evaluated.  2. Significant left  ventricular apical contrast swirling suggestive of sluggish flow without any obvious apical thrombus.  3. Right ventricular systolic function is mildly reduced. The right ventricular size is mildly enlarged. Moderately increased right ventricular wall thickness.  4. Left atrial size was mildly dilated.  5. The mitral valve is normal in structure. Mild mitral valve regurgitation. No evidence of mitral stenosis.  6. Tricuspid valve regurgitation is moderate.  7. The aortic valve is normal in structure. Aortic valve regurgitation is not visualized. No aortic stenosis is present.  8. The inferior vena cava is dilated in size with <50% respiratory variability, suggesting right atrial pressure of 15 mmHg. Comparison(s): A prior study was performed on 06/25/2023. The ejection fraction was 55-60% with grade I diastolic dysfunction and no regional wall motion abnormalities however on personal read the ejection fraction was likely mildly reduced with septal hypokinesis. Conclusion(s)/Recommendation(s): Critical findings reported to Dr. Paula Holster. FINDINGS  Left Ventricle: Significant left ventricular apical contrast swirling suggestive of sluggish flow without any obvious apical thrombus. Left ventricular ejection fraction, by estimation, is <20%. The left ventricle has severely decreased function. The left ventricle demonstrates regional wall motion abnormalities. Definity  contrast agent was given IV to delineate the left ventricular endocardial borders. The left ventricular internal cavity size was moderately dilated. There is no left ventricular hypertrophy. Left ventricular diastolic function could not be evaluated.  LV Wall Scoring: The entire anterior wall, antero-lateral wall, mid and distal lateral wall, entire septum, entire apex, and mid and distal inferior wall are akinetic. The basal inferolateral segment and basal inferior segment are hypokinetic. Right Ventricle: The right ventricular size is mildly  enlarged. Moderately increased right ventricular wall thickness. Right ventricular systolic function is mildly reduced. Left Atrium: Left atrial size was mildly dilated. Right Atrium: Right atrial size was normal in size. Pericardium: There is no evidence of pericardial effusion. Mitral Valve: The mitral valve is normal in structure. There is mild thickening of the mitral valve leaflet(s). Mild mitral valve regurgitation. No evidence of mitral valve stenosis. Tricuspid Valve: The tricuspid valve is not well visualized. Tricuspid valve regurgitation is moderate . No evidence of tricuspid stenosis. Aortic Valve: The aortic valve is normal in structure. Aortic valve regurgitation is not visualized. No aortic stenosis is present. Aortic valve mean gradient measures 1.0 mmHg. Aortic valve peak gradient measures 2.5 mmHg. Aortic valve area, by VTI measures 1.98 cm. Pulmonic Valve: The pulmonic valve was normal in structure. Pulmonic valve regurgitation is not visualized. No evidence of pulmonic stenosis. Aorta: The aortic root is normal in size and structure. Venous: The inferior vena cava is dilated in size with less than 50% respiratory variability, suggesting right atrial pressure of 15 mmHg. IAS/Shunts: No atrial level shunt detected by color flow Doppler. Additional Comments: A device lead is visualized in the right atrium and right ventricle.  LEFT VENTRICLE  PLAX 2D LVIDd:         6.40 cm LVIDs:         5.70 cm LV PW:         0.80 cm LV IVS:        0.80 cm LVOT diam:     1.95 cm LV SV:         20 LV SV Index:   13 LVOT Area:     2.99 cm  RIGHT VENTRICLE            IVC RV S prime:     8.49 cm/s  IVC diam: 2.00 cm TAPSE (M-mode): 2.4 cm LEFT ATRIUM             Index        RIGHT ATRIUM           Index LA Vol (A2C):   27.6 ml 18.37 ml/m  RA Area:     11.90 cm LA Vol (A4C):   53.3 ml 35.48 ml/m  RA Volume:   24.40 ml  16.24 ml/m LA Biplane Vol: 41.5 ml 27.62 ml/m  AORTIC VALVE AV Area (Vmax):    2.00 cm AV Area  (Vmean):   1.84 cm AV Area (VTI):     1.98 cm AV Vmax:           78.90 cm/s AV Vmean:          50.200 cm/s AV VTI:            0.100 m AV Peak Grad:      2.5 mmHg AV Mean Grad:      1.0 mmHg LVOT Vmax:         52.90 cm/s LVOT Vmean:        31.000 cm/s LVOT VTI:          0.066 m LVOT/AV VTI ratio: 0.66  AORTA Ao Root diam: 3.20 cm MITRAL VALVE               TRICUSPID VALVE MV Area (PHT): 3.86 cm    TR Peak grad:   30.0 mmHg MV E velocity: 48.80 cm/s  TR Vmax:        274.00 cm/s                             SHUNTS                            Systemic VTI:  0.07 m                            Systemic Diam: 1.95 cm Emeline Calender Electronically signed by Emeline Calender Signature Date/Time: 07/09/2024/1:23:07 PM    Final    DG Chest Port 1 View Result Date: 07/09/2024 CLINICAL DATA:  Ventilator dependence.  Endotracheal tube placement. EXAM: PORTABLE CHEST 1 VIEW COMPARISON:  07/09/2024 FINDINGS: Endotracheal tube tip is positioned at the carina. This could be withdrawn about 2 cm for tip placement in the distal trachea. The NG tube passes into the stomach although the distal tip position is not included on the film. The cardio pericardial silhouette is enlarged. Similar asymmetric elevation right hemidiaphragm. Bibasilar atelectasis or infiltrate with probable small layering bilateral pleural effusions. Right Port-A-Cath again noted. Left-sided permanent pacemaker evident. Telemetry leads overlie the chest. Gaseous distention of the colon is identified in the right  upper quadrant with gaseous distention of the stomach seen in the left subdiaphragmatic region. IMPRESSION: 1. Endotracheal tube tip is positioned at the carina. This could be withdrawn about 2 cm for tip placement in the distal trachea. 2. Bibasilar atelectasis or infiltrate with probable small layering bilateral pleural effusions. Critical Value/emergent results were called by telephone at the time of interpretation on 07/09/2024 at 7:26 am to ER Provider ,  who verbally acknowledged these results. Electronically Signed   By: Camellia Candle M.D.   On: 07/09/2024 07:27   DG Chest Port 1 View Result Date: 07/09/2024 EXAM: 1 VIEW(S) XRAY OF THE CHEST 07/09/2024 06:30:00 AM COMPARISON: 11/06/2023 CLINICAL HISTORY: sob FINDINGS: LINES, TUBES AND DEVICES: Right chest port with tip at cavoatrial junction. Left chest AICD noted. LUNGS AND PLEURA: Low lung volumes. Bilateral interstitial opacities. Elevated right hemidiaphragm with right basilar atelectasis. No pleural effusion. No pneumothorax. HEART AND MEDIASTINUM: Aortic atherosclerosis. No acute abnormality of the cardiac and mediastinal silhouettes. BONES AND SOFT TISSUES: Cervical fusion hardware. Gaseous bowel distention. IMPRESSION: 1. Low lung volumes with bilateral interstitial opacities and right basilar atelectasis. 2. Elevated right hemidiaphragm. Electronically signed by: Evalene Coho MD 07/09/2024 06:38 AM EST RP Workstation: [id number]   CT ABDOMEN PELVIS W CONTRAST Result Date: 07/08/2024 CLINICAL DATA:  Abdominal pain, nausea and vomiting. Bladder cancer. EXAM: CT ABDOMEN AND PELVIS WITH CONTRAST TECHNIQUE: Multidetector CT imaging of the abdomen and pelvis was performed using the standard protocol following bolus administration of intravenous contrast. RADIATION DOSE REDUCTION: This exam was performed according to the departmental dose-optimization program which includes automated exposure control, adjustment of the mA and/or kV according to patient size and/or use of iterative reconstruction technique. CONTRAST:  80mL OMNIPAQUE  IOHEXOL  300 MG/ML  SOLN COMPARISON:  Pelvic CT dated 11/17/2023. FINDINGS: Evaluation of this exam is limited due to respiratory motion and streak artifact caused by patient's arms and pacemaker. Lower chest: There is eventration of the right hemidiaphragm with right lung base atelectasis. Pneumonia is not excluded. Cardiac pacemaker noted. No intra-abdominal free air or  free fluid. Hepatobiliary: The liver is unremarkable. There is mild dilatation, post cholecystectomy. No retained calcified stone noted in the central CBD. Pancreas: Unremarkable. No pancreatic ductal dilatation or surrounding inflammatory changes. Spleen: Normal in size without focal abnormality. Adrenals/Urinary Tract: The adrenal glands unremarkable. Small bilateral renal cysts and subcentimeter hypodense lesions which are too small to characterize. There is a mild right hydronephrosis. Right pigtail ureteral stent with proximal tip in the proximal right ureter distal end in the urinary bladder. No hydronephrosis on the left. There is delayed excretion of contrast by the right kidney. The urinary bladder is decompressed around a Foley catheter. Stomach/Bowel: There is diffuse thickened appearance of the colon consistent with pancolitis. There is no bowel obstruction. Appendectomy. Vascular/Lymphatic: Mild aortoiliac atherosclerotic disease. The IVC is unremarkable. No portal venous gas. No adenopathy. Reproductive: The uterus is poorly visualized and not evaluated. Other: None Musculoskeletal: Osteopenia with degenerative changes of spine. Total right hip arthroplasty with associated streak artifact limiting evaluation the pelvic structures. No acute osseous pathology. IMPRESSION: 1. Pancolitis. No bowel obstruction. 2. Mild right hydronephrosis. Right pigtail ureteral stent with proximal tip in the proximal right ureter. 3.  Aortic Atherosclerosis (ICD10-I70.0). Electronically Signed   By: Vanetta Chou M.D.   On: 07/08/2024 14:50   NM PET Image Restag (PS) Skull Base To Thigh Result Date: 07/02/2024 EXAM: PET AND CT SKULL BASE TO MID THIGH 07/01/2024 02:22:24 PM TECHNIQUE: RADIOPHARMACEUTICAL: 6.31 mCi F-18 FDG  Uptake time 60 minutes. Glucose level 98 mg/dl. Blood pool SUV 2.2. PET imaging was acquired from the base of the skull to the mid thighs. Non-contrast enhanced computed tomography was obtained for  attenuation correction and anatomic localization. COMPARISON: 02/12/2024 CLINICAL HISTORY: Follow up to evaluate treatment response from bladder cancer. FINDINGS: HEAD AND NECK: No metabolically active cervical lymphadenopathy. CHEST: No metabolically active pulmonary nodules. No metabolically active lymphadenopathy. Injection related activity in the right port-a-cath port. AICD noted. Right port-a-cath tip in SVC. Atheromatous vascular calcification of the aortic arch. ABDOMEN AND PELVIS: No metabolically active intraperitoneal mass. No metabolically active lymphadenopathy. Scattered presumed physiologic activity in bowel. Accentuated pelvic activity with CT data obscured by streak artifact from the right hip implant. This activity may be in the distal ureter or in the vicinity of the cervix/lower uterine segment. Maximum SUV 10.5. Cholecystectomy. Right double J ureteral stent noted with proximal loop formed in the proximal ureter and stranding on the right renal sinus adipose tissues and perirenal space greater than the left. Foley catheter in the urinary bladder. 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 Right total hip prosthesis. Findings of remote sacral fracture noted. IMPRESSION: 1. No evidence of metabolically active disease related to bladder cancer. 2. Focal pelvic hypermetabolism with maximum SUV 10.5, localization limited by streak artifact from right hip prosthesis; consider dedicated pelvic evaluation to determine origin (distal ureter versus cervix/lower uterine segment). 3. Prior osseous metastatic sites are not hypermetabolic. 4. Degenerative changes of the right hip status post total hip arthroplasty. 5. Findings of a remote sacral fracture. Electronically signed by: Ryan Salvage MD 07/02/2024 04:24 PM EST RP Workstation: [id number]    Microbiology: No results found for this or any previous visit (from the past 240  hours).  Time spent: 25 minutes  Signed: Delon Hoe, DO 08-21-24

## 2024-07-26 NOTE — Progress Notes (Signed)
 2 Rn's present to pronounce pts time of death at 1032. See post-mortem flowsheet. Family present and notified. Hospitalist notified. Post partum care completed by [facility] and staff. Patient transferred off unit to morgue.

## 2024-07-26 NOTE — Progress Notes (Signed)
 Daily Progress Note   Date: 2024/08/14   Patient Name: [name]  DOB: 09-07-1955  MRN: [id number]  Age / Sex: 68 y.o., female  Attending Physician: Rojelio Nest, DO Primary Care Physician: Teressa Harrie HERO, FNP Admit Date: 07/08/2024 Length of Stay: 14 days  Reason for Follow-up: Establishing goals of care  Past Medical History:  Diagnosis Date   Abnormality of gait due to impairment of balance 11/22/2020   Admission for long-term opiate analgesic use 10/24/2019   AKI (acute kidney injury) 11/14/2023   Anemia of chronic disease 06/07/2023   Arthralgia of both hands 04/07/2023   Arthritis of right hip 05/29/2016   Formatting of this note might be different from the original. Added automatically from request for surgery [id number]   Arthritis of right knee 11/27/2016   At risk for side effect of medication 12/18/2023   Bilateral lower extremity edema 06/07/2023   BMI 26.0-26.9,adult 03/29/2020   Bone lesion 11/18/2023   Cardiomyopathy (HCC)    Overview:  Ejection fraction 45% in 2015 Ejection fraction 30 to 35% in November 2018   Chronic active hepatitis (HCC) 01/04/2023   Chronic back pain    Chronic hypoxemic respiratory failure (HCC) 10/24/2019   Chronic narcotic use 03/23/2015   Chronic pain of both knees 12/21/2018   Added automatically from request for surgery [id number]  Formatting of this note might be different from the original. 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/19/2017   Dyslipidemia 05/31/2015   Elevated serum glucose 06/23/2023   Fatigue 06/23/2023   Folate deficiency 01/08/2024   GERD (gastroesophageal reflux disease)    History of total hip replacement 09/25/2016   Hypoaldosteronism 07/13/2020   Hypotension 05/17/2020   ICD (implantable cardioverter-defibrillator) in place 06/19/2017   Ileus following gastrointestinal surgery (HCC) 03/26/2015   Iron deficiency 11/21/2023   Low vitamin B12 level 11/21/2023   Major depressive disorder, single episode, moderate (HCC) 10/24/2019   Malnutrition of moderate degree 10/20/2020   Mesenteric ischemia 10/16/2020   Migraine without aura and without status migrainosus, not intractable 01/04/2023   Mixed hyperlipidemia 05/31/2015   Mixed incontinence 10/20/2020   Moderate protein-calorie malnutrition 11/03/2023   Nausea with vomiting    Nausea without vomiting 01/29/2024   Nephrostomy present (HCC) 12/01/2023   Neuropathy 01/29/2024   Normocytic anemia 06/23/2023   Osteoporosis 10/24/2019   Other spondylosis with radiculopathy, lumbar region 10/24/2019   Paraplegia (HCC) 11/14/2023   Presence of left artificial hip joint 10/24/2019   Primary insomnia 01/04/2023   PTSD (post-traumatic stress disorder)    Renal lesion 11/05/2023   Rheumatoid arthritis involving multiple sites (HCC) 04/07/2023   Senile osteoporosis 10/24/2019   Serosanguineous chronic otitis media of right ear 08/02/2021  Therapeutic opioid induced constipation 01/04/2023   Thyroid  disease    Urge incontinence of urine 11/03/2023   Urothelial carcinoma of bladder (HCC)  11/18/2023   Vertebral fracture, pathological 12/01/2023   Vitamin D  deficiency 09/25/2023    Subjective:   Subjective: Chart Reviewed. Updates received. Patient Assessed. Created space and opportunity for patient  and family to explore thoughts and feelings regarding current medical situation. At present, patient is resting in bed, unresponsive, no seizure activity at present, appears pale, has agonal breathing.  Today's Discussion: Today before meeting with the patient/family, I reviewed the chart notes including chaplain notes from yesterday, nursing note from today. I also reviewed vital signs, nursing flowsheets, and medication administrations record. No labs due to comfort care status.  Vital signs noted.   Comfort medications noted. Have also discussed with night nursing colleagues overnight.   Daughter and grand son at bedside. We reviewed today's medication adjustments so as to continue comfort-focused care. We discussed about frequency and duration of seizure activity in the past 24 hours. Discussed about end of life signs and symptoms, provided education and offered support.     I shared that while the patient remains admitted palliative medicine will continue to follow closely for symptom management needs.  Will continue to escalate as needed to provide comfort, peace, dignity.  I provided emotional and general support through therapeutic listening, empathy, sharing of stories, and other techniques. I answered all questions and addressed all concerns to the best of my ability.  Review of Systems  Unable to perform ROS   Objective:   Primary Diagnoses: Present on Admission:  Hematemesis  GIB (gastrointestinal bleeding)  Acute on chronic systolic heart failure (HCC)  Septic shock (HCC)  AKI (acute kidney injury)   Vital Signs:  BP (!) 64/27 (BP Location: Left Arm)   Pulse 73   Temp (!) 97.5 F (36.4 C) (Oral)   Resp 14   Ht 5' (1.524 m)   Wt 54.4 kg   SpO2 (!)  86%   BMI 23.44 kg/m   Physical Exam Vitals and nursing note reviewed.  Constitutional:      General: [name] is sleeping. [name] is not in acute distress.    Appearance: [name] is ill-appearing.  HENT:     Head: Normocephalic and atraumatic.  Pulmonary:     Effort: Pulmonary effort is normal. No respiratory distress.     Comments: Respiratory pattern irregular with apneic pauses Abdominal:     General: Abdomen is flat.  Skin:    General: Skin is warm and dry.  Neurological:     Comments: Twitching like movements likely neurological in the setting of accumulating opioid metabolites     Palliative Assessment/Data: 10%   Existing Vynca/ACP Documentation: None  Assessment & Plan:   HPI/Patient Profile:  [name] is a 68 yo female with bladder cancer with bone mets, chronic pain on methadone , HFrEF, NICM with AICD, and COPD.    Patient was transitioned to comfort-focused care last 07/12/2024 given patient's very poor prognosis related to multiorgan failure in the setting of cardiogenic shock.    PMT was consulted to provide support and get involved with symptom management for comfort care.  SUMMARY OF RECOMMENDATIONS   DNR-comfort Continue comfort care See symptom management orders below Escalation of palliative sedation was pending clinical progress include addition of phenobarbital , transition to propofol  sedation although this would require transfer to stepdown/ICU within the next 24 hours if needed.  Ongoing palliative medicine support the patient and family,  chaplains also touching base with family for support, in addition to excellent nursing care which is appreciated.  Palliative medicine will continue to follow  Symptom Management:  Tylenol  650 mg PR every 6 hours as needed mild pain (1-3), fever Artificial tears 1 drop OU 4 times daily as needed dry eyes Fentanyl  drip 500 - 1500 mcg/h titrate per instructions INCREASED midazolam  infusion to 12 mg/h; can further increase  up to 14 mg/h if needed Midazolam  bolus via infusion 6-8 mg every 15 minutes as needed myoclonic activity REPEAT phenobarbital  loading dose IV (dosed per pharmacy)   phenobarbital  100 mg every 6 hours Robinul  0.4 mg IV every 4 hours Rectal Diazepam  10 mg PR as a last resort once, if needed, can repeat in 4 hours, if needed.  Scopolamine  patch transdermal every 72 hours  Code Status: DNR - Comfort  Prognosis: Hours - Days  Discharge Planning: Anticipated Hospital Death  Discussed with: Patient's family, medical team, nursing team  Thank you for allowing us  to participate in the care of Anne Boltz Hallisey PMT will continue to support holistically.  Billing based on MDM: High   Problems Addressed: One acute or chronic illness or injury that poses a threat to life or bodily function  Risks: Parenteral controlled substances  Detailed review of medical records (labs, imaging, vital signs), medically appropriate exam, discussed with treatment team, counseling and education to patient, family, & staff, documenting clinical information, medication management, coordination of care  Lonia Serve MD.  Palliative Medicine Team  Team Phone # [phone] (Nights/Weekends)

## 2024-07-26 DEATH — deceased

## 2024-09-01 ENCOUNTER — Encounter

## 2024-09-06 ENCOUNTER — Other Ambulatory Visit: Payer: Self-pay | Admitting: Family Medicine

## 2024-12-01 ENCOUNTER — Encounter

## 2025-03-02 ENCOUNTER — Encounter

## 2025-06-01 ENCOUNTER — Encounter
